# Patient Record
Sex: Male | Born: 1965 | Race: Black or African American | Hispanic: No | Marital: Married | Smoking: Never smoker
Health system: Southern US, Community
[De-identification: ages and names within clinical notes are randomized; demographics above are authoritative.]

## PROBLEM LIST (undated history)

## (undated) DIAGNOSIS — R569 Unspecified convulsions: Secondary | ICD-10-CM

## (undated) DIAGNOSIS — Z9989 Dependence on other enabling machines and devices: Secondary | ICD-10-CM

## (undated) DIAGNOSIS — G4733 Obstructive sleep apnea (adult) (pediatric): Secondary | ICD-10-CM

## (undated) DIAGNOSIS — I469 Cardiac arrest, cause unspecified: Secondary | ICD-10-CM

## (undated) DIAGNOSIS — E119 Type 2 diabetes mellitus without complications: Secondary | ICD-10-CM

## (undated) DIAGNOSIS — K219 Gastro-esophageal reflux disease without esophagitis: Secondary | ICD-10-CM

## (undated) DIAGNOSIS — N179 Acute kidney failure, unspecified: Secondary | ICD-10-CM

## (undated) DIAGNOSIS — F419 Anxiety disorder, unspecified: Secondary | ICD-10-CM

## (undated) DIAGNOSIS — K274 Chronic or unspecified peptic ulcer, site unspecified, with hemorrhage: Secondary | ICD-10-CM

## (undated) DIAGNOSIS — N289 Disorder of kidney and ureter, unspecified: Secondary | ICD-10-CM

## (undated) DIAGNOSIS — Z5189 Encounter for other specified aftercare: Secondary | ICD-10-CM

## (undated) DIAGNOSIS — E7800 Pure hypercholesterolemia, unspecified: Secondary | ICD-10-CM

## (undated) DIAGNOSIS — I10 Essential (primary) hypertension: Secondary | ICD-10-CM

## (undated) DIAGNOSIS — I872 Venous insufficiency (chronic) (peripheral): Secondary | ICD-10-CM

## (undated) DIAGNOSIS — G931 Anoxic brain damage, not elsewhere classified: Secondary | ICD-10-CM

## (undated) DIAGNOSIS — F1021 Alcohol dependence, in remission: Secondary | ICD-10-CM

## (undated) HISTORY — DX: Unspecified convulsions: R56.9

## (undated) HISTORY — DX: Venous insufficiency (chronic) (peripheral): I87.2

## (undated) HISTORY — DX: Acute kidney failure, unspecified: N17.9

## (undated) HISTORY — PX: KNEE ARTHROSCOPY: SHX127

## (undated) HISTORY — DX: Alcohol dependence, in remission: F10.21

## (undated) HISTORY — PX: UPPER GASTROINTESTINAL ENDOSCOPY: SHX188

---

## 1998-08-08 ENCOUNTER — Ambulatory Visit (HOSPITAL_BASED_OUTPATIENT_CLINIC_OR_DEPARTMENT_OTHER): Admission: RE | Admit: 1998-08-08 | Discharge: 1998-08-08 | Payer: Self-pay | Admitting: Orthopedic Surgery

## 1999-10-09 ENCOUNTER — Encounter: Payer: Self-pay | Admitting: Emergency Medicine

## 1999-10-09 ENCOUNTER — Inpatient Hospital Stay (HOSPITAL_COMMUNITY): Admission: EM | Admit: 1999-10-09 | Discharge: 1999-10-13 | Payer: Self-pay | Admitting: Emergency Medicine

## 1999-10-11 ENCOUNTER — Encounter: Payer: Self-pay | Admitting: Internal Medicine

## 2001-12-22 ENCOUNTER — Encounter: Payer: Self-pay | Admitting: Emergency Medicine

## 2001-12-22 ENCOUNTER — Inpatient Hospital Stay (HOSPITAL_COMMUNITY): Admission: EM | Admit: 2001-12-22 | Discharge: 2001-12-25 | Payer: Self-pay | Admitting: Emergency Medicine

## 2001-12-30 ENCOUNTER — Encounter: Admission: RE | Admit: 2001-12-30 | Discharge: 2001-12-30 | Payer: Self-pay | Admitting: Family Medicine

## 2002-01-05 ENCOUNTER — Encounter: Admission: RE | Admit: 2002-01-05 | Discharge: 2002-01-05 | Payer: Self-pay | Admitting: Family Medicine

## 2002-05-25 ENCOUNTER — Encounter: Admission: RE | Admit: 2002-05-25 | Discharge: 2002-05-25 | Payer: Self-pay | Admitting: Family Medicine

## 2002-07-09 ENCOUNTER — Encounter: Admission: RE | Admit: 2002-07-09 | Discharge: 2002-07-09 | Payer: Self-pay | Admitting: Family Medicine

## 2003-01-17 ENCOUNTER — Encounter: Admission: RE | Admit: 2003-01-17 | Discharge: 2003-01-17 | Payer: Self-pay | Admitting: Radiology

## 2003-01-24 ENCOUNTER — Encounter: Admission: RE | Admit: 2003-01-24 | Discharge: 2003-01-24 | Payer: Self-pay | Admitting: Sports Medicine

## 2003-01-24 ENCOUNTER — Encounter: Payer: Self-pay | Admitting: Sports Medicine

## 2003-03-09 ENCOUNTER — Encounter: Admission: RE | Admit: 2003-03-09 | Discharge: 2003-03-09 | Payer: Self-pay | Admitting: Family Medicine

## 2003-04-13 ENCOUNTER — Encounter: Admission: RE | Admit: 2003-04-13 | Discharge: 2003-04-13 | Payer: Self-pay | Admitting: Family Medicine

## 2003-07-06 ENCOUNTER — Encounter: Admission: RE | Admit: 2003-07-06 | Discharge: 2003-07-06 | Payer: Self-pay | Admitting: Family Medicine

## 2003-07-25 ENCOUNTER — Encounter: Admission: RE | Admit: 2003-07-25 | Discharge: 2003-10-23 | Payer: Self-pay | Admitting: Internal Medicine

## 2004-01-23 ENCOUNTER — Encounter: Admission: RE | Admit: 2004-01-23 | Discharge: 2004-01-23 | Payer: Self-pay | Admitting: Family Medicine

## 2004-11-05 ENCOUNTER — Ambulatory Visit: Payer: Self-pay | Admitting: Family Medicine

## 2005-01-24 ENCOUNTER — Ambulatory Visit: Payer: Self-pay | Admitting: Sports Medicine

## 2005-05-29 ENCOUNTER — Emergency Department (HOSPITAL_COMMUNITY): Admission: EM | Admit: 2005-05-29 | Discharge: 2005-05-30 | Payer: Self-pay | Admitting: Emergency Medicine

## 2005-10-20 ENCOUNTER — Ambulatory Visit: Payer: Self-pay | Admitting: Sports Medicine

## 2005-10-20 ENCOUNTER — Inpatient Hospital Stay (HOSPITAL_COMMUNITY): Admission: EM | Admit: 2005-10-20 | Discharge: 2005-10-24 | Payer: Self-pay | Admitting: Emergency Medicine

## 2005-10-30 ENCOUNTER — Ambulatory Visit: Payer: Self-pay | Admitting: Family Medicine

## 2005-11-20 ENCOUNTER — Ambulatory Visit: Payer: Self-pay | Admitting: Family Medicine

## 2006-10-27 ENCOUNTER — Encounter (INDEPENDENT_AMBULATORY_CARE_PROVIDER_SITE_OTHER): Payer: Self-pay | Admitting: Family Medicine

## 2006-10-27 ENCOUNTER — Ambulatory Visit: Payer: Self-pay | Admitting: Sports Medicine

## 2006-10-27 LAB — CONVERTED CEMR LAB
ALT: 17 units/L (ref 0–53)
Albumin: 4.6 g/dL (ref 3.5–5.2)
CO2: 26 meq/L (ref 19–32)
Calcium: 9.5 mg/dL (ref 8.4–10.5)
Chloride: 99 meq/L (ref 96–112)
Glucose, Bld: 238 mg/dL — ABNORMAL HIGH (ref 70–99)
HCT: 39.2 % (ref 39.0–52.0)
Hemoglobin: 13.2 g/dL (ref 13.0–17.0)
MCHC: 33.7 g/dL (ref 30.0–36.0)
MCV: 90.5 fL (ref 78.0–100.0)
Platelets: 382 10*3/uL (ref 150–400)
Potassium: 4.5 meq/L (ref 3.5–5.3)
Sodium: 139 meq/L (ref 135–145)
TSH: 1.944 microintl units/mL (ref 0.350–5.50)
Total Bilirubin: 0.4 mg/dL (ref 0.3–1.2)
Total Protein: 8.1 g/dL (ref 6.0–8.3)

## 2006-12-04 DIAGNOSIS — M129 Arthropathy, unspecified: Secondary | ICD-10-CM | POA: Insufficient documentation

## 2006-12-04 DIAGNOSIS — I10 Essential (primary) hypertension: Secondary | ICD-10-CM

## 2006-12-04 DIAGNOSIS — Z794 Long term (current) use of insulin: Secondary | ICD-10-CM

## 2006-12-04 DIAGNOSIS — E7800 Pure hypercholesterolemia, unspecified: Secondary | ICD-10-CM

## 2006-12-04 DIAGNOSIS — E1140 Type 2 diabetes mellitus with diabetic neuropathy, unspecified: Secondary | ICD-10-CM

## 2006-12-04 DIAGNOSIS — F411 Generalized anxiety disorder: Secondary | ICD-10-CM

## 2006-12-04 DIAGNOSIS — E669 Obesity, unspecified: Secondary | ICD-10-CM

## 2007-04-14 DIAGNOSIS — F528 Other sexual dysfunction not due to a substance or known physiological condition: Secondary | ICD-10-CM

## 2007-10-09 ENCOUNTER — Inpatient Hospital Stay (HOSPITAL_COMMUNITY): Admission: EM | Admit: 2007-10-09 | Discharge: 2007-10-20 | Payer: Self-pay | Admitting: Emergency Medicine

## 2007-10-09 ENCOUNTER — Ambulatory Visit: Payer: Self-pay | Admitting: Pulmonary Disease

## 2007-10-10 ENCOUNTER — Encounter: Payer: Self-pay | Admitting: Internal Medicine

## 2007-10-10 ENCOUNTER — Ambulatory Visit: Payer: Self-pay | Admitting: Pulmonary Disease

## 2007-10-10 ENCOUNTER — Ambulatory Visit: Payer: Self-pay | Admitting: Family Medicine

## 2007-10-14 ENCOUNTER — Telehealth (INDEPENDENT_AMBULATORY_CARE_PROVIDER_SITE_OTHER): Payer: Self-pay

## 2007-10-22 ENCOUNTER — Telehealth: Payer: Self-pay | Admitting: *Deleted

## 2007-11-06 ENCOUNTER — Telehealth: Payer: Self-pay | Admitting: *Deleted

## 2007-11-10 ENCOUNTER — Ambulatory Visit: Payer: Self-pay | Admitting: Family Medicine

## 2007-11-10 ENCOUNTER — Encounter (INDEPENDENT_AMBULATORY_CARE_PROVIDER_SITE_OTHER): Payer: Self-pay | Admitting: Family Medicine

## 2007-11-10 DIAGNOSIS — F1021 Alcohol dependence, in remission: Secondary | ICD-10-CM

## 2007-11-10 HISTORY — DX: Alcohol dependence, in remission: F10.21

## 2007-11-10 LAB — CONVERTED CEMR LAB
ALT: 11 units/L (ref 0–53)
Alkaline Phosphatase: 76 units/L (ref 39–117)
CO2: 23 meq/L (ref 19–32)
Cholesterol: 158 mg/dL (ref 0–200)
Creatinine, Ser: 1.01 mg/dL (ref 0.40–1.50)
HCT: 40.1 % (ref 39.0–52.0)
LDL Cholesterol: 94 mg/dL (ref 0–99)
MCHC: 31.7 g/dL (ref 30.0–36.0)
MCV: 94.1 fL (ref 78.0–100.0)
Platelets: 441 10*3/uL — ABNORMAL HIGH (ref 150–400)
Total Bilirubin: 0.5 mg/dL (ref 0.3–1.2)
Total CHOL/HDL Ratio: 4
Triglycerides: 121 mg/dL (ref <150)
VLDL: 24 mg/dL (ref 0–40)
WBC: 10.9 10*3/uL — ABNORMAL HIGH (ref 4.0–10.5)

## 2007-11-18 ENCOUNTER — Telehealth (INDEPENDENT_AMBULATORY_CARE_PROVIDER_SITE_OTHER): Payer: Self-pay | Admitting: Family Medicine

## 2007-11-23 ENCOUNTER — Encounter: Payer: Self-pay | Admitting: *Deleted

## 2008-02-12 DIAGNOSIS — G4726 Circadian rhythm sleep disorder, shift work type: Secondary | ICD-10-CM | POA: Insufficient documentation

## 2008-02-24 ENCOUNTER — Encounter (INDEPENDENT_AMBULATORY_CARE_PROVIDER_SITE_OTHER): Payer: Self-pay | Admitting: Family Medicine

## 2008-03-10 ENCOUNTER — Encounter (INDEPENDENT_AMBULATORY_CARE_PROVIDER_SITE_OTHER): Payer: Self-pay | Admitting: *Deleted

## 2008-03-18 ENCOUNTER — Encounter: Payer: Self-pay | Admitting: *Deleted

## 2008-03-30 ENCOUNTER — Telehealth: Payer: Self-pay | Admitting: Family Medicine

## 2008-04-13 ENCOUNTER — Ambulatory Visit: Payer: Self-pay | Admitting: Family Medicine

## 2008-06-30 ENCOUNTER — Encounter: Payer: Self-pay | Admitting: *Deleted

## 2008-07-04 ENCOUNTER — Ambulatory Visit: Payer: Self-pay | Admitting: Family Medicine

## 2008-07-04 ENCOUNTER — Encounter: Payer: Self-pay | Admitting: Family Medicine

## 2008-07-04 DIAGNOSIS — R609 Edema, unspecified: Secondary | ICD-10-CM

## 2008-07-04 LAB — CONVERTED CEMR LAB
BUN: 9 mg/dL (ref 6–23)
CO2: 26 meq/L (ref 19–32)
Calcium: 9.6 mg/dL (ref 8.4–10.5)
Chloride: 101 meq/L (ref 96–112)
Creatinine, Ser: 1.37 mg/dL (ref 0.40–1.50)
Glucose, Bld: 186 mg/dL — ABNORMAL HIGH (ref 70–99)
Hgb A1c MFr Bld: 13.4 %
Potassium: 4.5 meq/L (ref 3.5–5.3)
Sodium: 140 meq/L (ref 135–145)

## 2008-07-12 ENCOUNTER — Encounter: Payer: Self-pay | Admitting: *Deleted

## 2008-07-20 ENCOUNTER — Encounter: Payer: Self-pay | Admitting: *Deleted

## 2008-07-20 ENCOUNTER — Telehealth: Payer: Self-pay | Admitting: Family Medicine

## 2008-08-11 ENCOUNTER — Encounter: Payer: Self-pay | Admitting: *Deleted

## 2008-09-12 ENCOUNTER — Encounter: Payer: Self-pay | Admitting: *Deleted

## 2008-09-15 ENCOUNTER — Telehealth: Payer: Self-pay | Admitting: Family Medicine

## 2008-09-18 ENCOUNTER — Encounter: Payer: Self-pay | Admitting: Family Medicine

## 2008-09-20 ENCOUNTER — Ambulatory Visit: Payer: Self-pay | Admitting: Family Medicine

## 2008-10-05 ENCOUNTER — Ambulatory Visit: Payer: Self-pay | Admitting: Family Medicine

## 2008-11-01 ENCOUNTER — Ambulatory Visit: Payer: Self-pay | Admitting: Family Medicine

## 2008-11-09 ENCOUNTER — Telehealth: Payer: Self-pay | Admitting: Family Medicine

## 2009-02-05 ENCOUNTER — Encounter: Payer: Self-pay | Admitting: Family Medicine

## 2009-02-14 ENCOUNTER — Ambulatory Visit: Payer: Self-pay | Admitting: Family Medicine

## 2009-02-14 DIAGNOSIS — G4700 Insomnia, unspecified: Secondary | ICD-10-CM | POA: Insufficient documentation

## 2009-05-11 ENCOUNTER — Inpatient Hospital Stay (HOSPITAL_COMMUNITY): Admission: EM | Admit: 2009-05-11 | Discharge: 2009-05-12 | Payer: Self-pay | Admitting: Emergency Medicine

## 2009-05-11 ENCOUNTER — Ambulatory Visit: Payer: Self-pay | Admitting: Family Medicine

## 2009-05-17 ENCOUNTER — Ambulatory Visit: Payer: Self-pay | Admitting: Family Medicine

## 2009-11-30 ENCOUNTER — Ambulatory Visit: Payer: Self-pay | Admitting: Family Medicine

## 2009-12-04 ENCOUNTER — Ambulatory Visit: Payer: Self-pay | Admitting: Family Medicine

## 2009-12-04 ENCOUNTER — Encounter: Payer: Self-pay | Admitting: Family Medicine

## 2009-12-04 LAB — CONVERTED CEMR LAB
Albumin: 4.2 g/dL (ref 3.5–5.2)
BUN: 10 mg/dL (ref 6–23)
CO2: 24 meq/L (ref 19–32)
Calcium: 9.6 mg/dL (ref 8.4–10.5)
Chloride: 100 meq/L (ref 96–112)
Cholesterol: 216 mg/dL — ABNORMAL HIGH (ref 0–200)
Glucose, Bld: 147 mg/dL — ABNORMAL HIGH (ref 70–99)
HDL: 84 mg/dL (ref >39)
PSA: 0.33 ng/mL (ref 0.10–4.00)
Potassium: 3.6 meq/L (ref 3.5–5.3)
Triglycerides: 95 mg/dL (ref <150)

## 2009-12-19 ENCOUNTER — Ambulatory Visit: Payer: Self-pay | Admitting: Family Medicine

## 2009-12-19 ENCOUNTER — Encounter: Payer: Self-pay | Admitting: *Deleted

## 2009-12-28 ENCOUNTER — Telehealth: Payer: Self-pay | Admitting: Family Medicine

## 2010-03-07 ENCOUNTER — Encounter: Payer: Self-pay | Admitting: Family Medicine

## 2010-05-24 ENCOUNTER — Encounter: Payer: Self-pay | Admitting: Family Medicine

## 2010-11-08 NOTE — Assessment & Plan Note (Signed)
Summary: uncontrolled HTN follow up   Vital Signs:  Patient profile:   45 year old male Weight:      280.3 pounds Temp:     98.8 degrees F oral Pulse rate:   79 / minute Pulse rhythm:   regular BP sitting:   204 / 139  (left arm)  Vitals Entered By: Audelia Hives CMA (December 04, 2009 8:51 AM)  Serial Vital Signs/Assessments:  Time      Position  BP       Pulse  Resp  Temp     By 8:52 AM             198/142                        Audelia Hives CMA   Primary Care Provider:  Esmeralda Arthur MD  CC:  HTN follow up. [name]  History of Present Illness: 45 y/o patient who has poorly controlled BP. He admitted to eating [place] soups all the time at work. He says he has not been paying attention to his sodium intake and thinks it is a lot. Discussed high sodium diet, discussed how to read a label to tell how much sodium in in a food, printed out label and helped him calculate the amount of sodium. Discussed newest guidelines of 1500mg  of sodium a day. Discussed eating more low sugar fruits and fresh or steamed vegetables to lower salt intake. He says he use to be really good about it and used Mrs. Dash as a Forensic psychologist. Encrouaged it's use. Pt says he is taking all his meds correctly, also says he is taking aa full HCTZ instead of half.   Current Medications (verified): 1)  Viagra 100 Mg  Tabs (Sildenafil Citrate) .... Take 1 Tablet 1/2 To 4 Hours Before Intercourse 2)  Adult Aspirin Ec Low Strength 81 Mg  Tbec (Aspirin) .... Take 1 By Mouth Daily 3)  Bd Disp Needles 27g X 1/2" Misc (Needle (Disp)) .... Use Needles For Insulin Injections. 4)  Test Strips For Ascensia Breeze .... Use As Directed 5)  Furosemide 40 Mg Tabs (Furosemide) .... Take 1 Pill Po Daily 6)  Lantus Solostar 100 Unit/ml Soln (Insulin Glargine) .... Inject 35 Units in The Morning and 35 in The Evening. 7)  Trazedone 25 Mg .... Take One Pill Before Bedtime For Sleep 8)  Simvastatin 40 Mg Tabs (Simvastatin) .... Take One Pill  Daily For Cholesterol 9)  Losartan Potassium 50 Mg Tabs (Losartan Potassium) .... Tkae One Pill Daily 10)  Toprol Xl 100 Mg Xr24h-Tab (Metoprolol Succinate) .... Take One Pill Every 12 Hours 11)  Amlodipine Besylate 10 Mg Tabs (Amlodipine Besylate) .... Take One Pill Daily 12)  Amlodipine Besylate 10 Mg Tabs (Amlodipine Besylate) .... Take One Pill Daily 13)  Hydrochlorothiazide 25 Mg Tabs (Hydrochlorothiazide) .... Take 1 Pill Daily Until You See Me Next.  Allergies (verified): 1)  ! Lisinopril  Review of Systems       pt had a headache 2 days ago, no vision changes, no chest pains. Didn't sleep well over the weekend b/c having insomnia.   Physical Exam  General:  Well-developed,well-nourished,in no acute distress; alert,appropriate and cooperative throughout examination Lungs:  Normal respiratory effort, chest expands symmetrically. Lungs are clear to auscultation, no crackles or wheezes. Heart:  Normal rate and regular rhythm. S1 and S2 normal without gallop, murmur, click, rub or other extra sounds. Abdomen:  Bowel sounds positive,abdomen soft and non-tender without  masses, organomegaly or hernias noted. Extremities:  no lower leg edema Psych:  Cognition and judgment appear intact. Alert and cooperative with normal attention span and concentration. No apparent delusions, illusions, hallucinations   Impression & Recommendations:  Problem # 1:  HYPERTENSION, BENIGN SYSTEMIC (ICD-401.1) Assessment Deteriorated Pt has poorly controlled hypertension. After discussing his diet with him today it is appartent that he is eating a lot of sodium. Spent more than 20 minutes educating about sodium intake and teaching him to read labels. I think the patient is starting to understand the seriousness of his condition. Gave him filled out FMLA papers. He says he will turn them in so he can some see the doctor more often. Told him if he does not get his BP under control he is at risk for MI or stroke  so he has to make his health a priority. He says he will try. RTC in 2 weeks. Changed HCTZ to 1x/ day in the computer (pt was already doing this but was suppose to have been on 1/2 tab).  He does not take Lasix daily, only when he feet swell.   His updated medication list for this problem includes:    Furosemide 40 Mg Tabs (Furosemide) .[name]... Take 1 pill po daily    Losartan Potassium 50 Mg Tabs (Losartan potassium) .[name]... Tkae one pill daily    Toprol Xl 100 Mg Xr24h-tab (Metoprolol succinate) .[name]... Take one pill every 12 hours    Amlodipine Besylate 10 Mg Tabs (Amlodipine besylate) .[name]... Take one pill daily    Hydrochlorothiazide 25 Mg Tabs (Hydrochlorothiazide) .[name]... Take 1 pill daily until you see me next.  Orders: [place]- Est Level  3 [id number])  Complete Medication List: 1)  Viagra 100 Mg Tabs (Sildenafil citrate) .... Take 1 tablet 1/2 to 4 hours before intercourse 2)  Adult Aspirin Ec Low Strength 81 Mg Tbec (Aspirin) .... Take 1 by mouth daily 3)  Bd Disp Needles 27g X 1/2" Misc (Needle (disp)) .... Use needles for insulin injections. 4)  Test Strips For Ascensia Breeze  .... Use as directed 5)  Furosemide 40 Mg Tabs (Furosemide) .... Take 1 pill po daily 6)  Lantus Solostar 100 Unit/ml Soln (Insulin glargine) .... Inject 35 units in the morning and 35 in the evening. 7)  Trazedone 25 Mg  .... Take one pill before bedtime for sleep 8)  Simvastatin 40 Mg Tabs (Simvastatin) .... Take one pill daily for cholesterol 9)  Losartan Potassium 50 Mg Tabs (Losartan potassium) .... Tkae one pill daily 10)  Toprol Xl 100 Mg Xr24h-tab (Metoprolol succinate) .... Take one pill every 12 hours 11)  Amlodipine Besylate 10 Mg Tabs (Amlodipine besylate) .... Take one pill daily 12)  Amlodipine Besylate 10 Mg Tabs (Amlodipine besylate) .... Take one pill daily 13)  Hydrochlorothiazide 25 Mg Tabs (Hydrochlorothiazide) .... Take 1 pill daily until you see me next.  Patient Instructions: 1)  Fruits to eat:  berries, apples, (not tropical (pineapples, oranges, mangos, etc...) 2)  Look at soup labels. Look for ones with less than 500mg  in your serving.  3)  Increase your HCTZ to 25 mg a day.  4)  I will call you with your lab results. 5)  I want to see you in 2 weeks.  Prescriptions: TRAZEDONE 25 MG Take one pill before bedtime for sleep  #31 x 11   Entered and Authorized by:   Esmeralda Arthur MD   Signed by:   Esmeralda Arthur MD on 12/04/2009  Method used:   Faxed to ...       [facility]* (mail-order)             ,          Ph: [phone]       Fax: [phone]   RxID:   [id number]   Laboratory Results   Urine Tests  Date/Time Received: December 04, 2009 9:00 AM  Date/Time Reported: December 04, 2009 9:39 AM   Microalbumin (urine): 150 mg/L Creatinine: 100mg /dL  A:C Ratio >300 High Abnormal Comments: ...........test performed by...........Marland KitchenHedy Camara, CMA

## 2010-11-08 NOTE — Assessment & Plan Note (Signed)
Summary: HTN-uncontrolled, Anxiety, Diabetes: Pt need FLP, CMP, PSA, Umic   Vital Signs:  Patient profile:   45 year old male Height:      70 inches Weight:      280.1 pounds BMI:     40.34 Temp:     98.8 degrees F oral Pulse rate:   72 / minute BP sitting:   230 / 142  (left arm) Cuff size:   large  Vitals Entered By: Isabelle Course (November 30, 2009 8:41 AM)   Serial Vital Signs/Assessments:  Time      Position  BP       Pulse  Resp  Temp     By 8:42 AM             228/136                        Isabelle Course                     200/100                        Esmeralda Arthur MD  Comments: 8:42 AM re checked manually By: Isabelle Course   CC: HTN, DM Is Patient Diabetic? Yes Did you bring your meter with you today? No Pain Assessment Patient in pain? no        Primary Care Provider:  Esmeralda Arthur MD  CC:  HTN and DM.  History of Present Illness: 45 y/o male who comes in with hypertension and diabetes follow up.   Uncontrolled hypertension: Pt comes in today with BP that is extremely elevated. he is not feeling any symptoms but says he has been a little more anxious for the last 1 month. He relates his high BP and his stress/anxiety to the extra time he is having to put in at work. 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 He has been asked repeatedly to stay and work an extra part of a shift keeping him there 16 hours at a time. This makes it difficult to take his meds the way he needs too. He also has a hard time getting time to come into the office to be seen. His last appointment was in August, 2010. He does not check his BP often at home. He is taking his BP meds correctly. His las BP in August was 188/120.  Diabetes: Pt has been able to get permission to take this medication to work but says there are times when he is working on the big machines  fo hours and he just can't/doesn't stop to take his meds. If he has a normal shift he takes his insulin at 11:30pm-12:00am. He did not bring his meds or log.   Anxiety; Pt says he use to be on anxiety meds, then was stable for a while on Paxil and when he wanted to get off meds he went to a "specialist" to learn how to cope with his anxiety and was able to get off meds. He says it is getting bad again and thinks that may be causing his high BP.     Habits & Providers  Alcohol-Tobacco-Diet     Tobacco Status: never  Current Medications (verified): 1)  Viagra 100 Mg  Tabs (Sildenafil Citrate) .... Take 1 Tablet  1/2 To 4 Hours Before Intercourse 2)  Adult Aspirin Ec Low Strength 81 Mg  Tbec (Aspirin) .... Take 1 By Mouth Daily 3)  Bd Disp Needles 27g X 1/2" Misc (Needle (Disp)) .... Use Needles For Insulin Injections. 4)  Test Strips For Ascensia Breeze .... Use As Directed 5)  Furosemide 40 Mg Tabs (Furosemide) .... Take 1 Pill Po Daily 6)  Lantus Solostar 100 Unit/ml Soln (Insulin Glargine) .... Inject 35 Units in The Morning and 35 in The Evening. 7)  Trazedone 25 Mg .... Take One Pill Before Bedtime For Sleep 8)  Simvastatin 40 Mg Tabs (Simvastatin) .... Take One Pill Daily For Cholesterol 9)  Losartan Potassium 50 Mg Tabs (Losartan Potassium) .... Tkae One Pill Daily 10)  Toprol Xl 100 Mg Xr24h-Tab (Metoprolol Succinate) .... Take One Pill Every 12 Hours 11)  Amlodipine Besylate 10 Mg Tabs (Amlodipine Besylate) .... Take One Pill Daily 12)  Amlodipine Besylate 10 Mg Tabs (Amlodipine Besylate) .... Take One Pill Daily 13)  Hydrochlorothiazide 25 Mg Tabs (Hydrochlorothiazide) .... Take 1/2 Pill Daily Until You See Me Next.  Allergies (verified): 1)  ! Lisinopril  Review of Systems        vitals reviewed and pertinent negatives and positives seen in HPI   Physical Exam  General:  Well-developed,well-nourished,in no acute distress; alert,appropriate and cooperative throughout  examination Lungs:  Normal respiratory effort, chest expands symmetrically. Lungs are clear to auscultation, no crackles or wheezes. Heart:  Normal rate and regular rhythm. S1 and S2 normal without gallop, murmur, click, rub or other extra sounds. Psych:  depressed affect.     Impression & Recommendations:  Problem # 1:  HYPERTENSION, BENIGN SYSTEMIC (ICD-401.1) Assessment Deteriorated Pt's BP is very high today. He says he is taking the meds correctly and the pharamcy confims that he is requesting the meds at the right time. I added Norvasc 10 mg and HCTZ 12.5 for now. Will reck in 4 days. May increase to HCTZ 25 mg daily if BP still not is good range.  Needs CMET at next appointment.   His updated medication list for this problem includes:    Furosemide 40 Mg Tabs (Furosemide) .[name]... Take 1 pill po daily    Losartan Potassium 50 Mg Tabs (Losartan potassium) .[name]... Tkae one pill daily    Toprol Xl 100 Mg Xr24h-tab (Metoprolol succinate) .[name]... Take one pill every 12 hours    Amlodipine Besylate 10 Mg Tabs (Amlodipine besylate) .[name]... Take one pill daily    Hydrochlorothiazide 25 Mg Tabs (Hydrochlorothiazide) .[name]... Take 1/2 pill daily until you see me next.  Orders: [place]- Est  Level 4 [id number])  Problem # 2:  ANXIETY (ICD-300.00) Assessment: Deteriorated Pt has a h/o anxiety. He says that Paxil worked well for him but that it gave him erectile dysfunction. He is not ready to start back on it at this time but he says he does feel like his anxiety is coming back. Will check in with him about this at the next appt.    Orders: [place]- Est  Level 4 [id number])  Problem # 3:  DIABETES MELLITUS II, UNCOMPLICATED ([id number]) Assessment: Improved Pt's A1c has improved. He has been approved to take his meds to work and appears to be doing a little better but he still has a long way to go to improve. PT is to RTC in 4 days. Will try to address his blood sugar with him at that time. May want to increase his  Lantus to 40 two  times a day. Needs microalbumin at next appointment.   His updated medication list for this problem includes:    Adult Aspirin Ec Low Strength 81 Mg Tbec (Aspirin) .[name]... Take 1 by mouth daily    Lantus Solostar 100 Unit/ml Soln (Insulin glargine) ..... Inject 35 units in the morning and 35 in the evening.    Losartan Potassium 50 Mg Tabs (Losartan potassium) .[name]... Tkae one pill daily  Orders: A1C-[facility] [id number]) [place]- Est  Level 4 (99214)Future Orders: UA Microalbumin-[facility] [id number]) ... 11/20/2010  Complete Medication List: 1)  Viagra 100 Mg Tabs (Sildenafil citrate) .... Take 1 tablet 1/2 to 4 hours before intercourse 2)  Adult Aspirin Ec Low Strength 81 Mg Tbec (Aspirin) .... Take 1 by mouth daily 3)  Bd Disp Needles 27g X 1/2" Misc (Needle (disp)) .... Use needles for insulin injections. 4)  Test Strips For Ascensia Breeze  .... Use as directed 5)  Furosemide 40 Mg Tabs (Furosemide) .... Take 1 pill po daily 6)  Lantus Solostar 100 Unit/ml Soln (Insulin glargine) .... Inject 35 units in the morning and 35 in the evening. 7)  Trazedone 25 Mg  .... Take one pill before bedtime for sleep 8)  Simvastatin 40 Mg Tabs (Simvastatin) .... Take one pill daily for cholesterol 9)  Losartan Potassium 50 Mg Tabs (Losartan potassium) .... Tkae one pill daily 10)  Toprol Xl 100 Mg Xr24h-tab (Metoprolol succinate) .... Take one pill every 12 hours 11)  Amlodipine Besylate 10 Mg Tabs (Amlodipine besylate) .... Take one pill daily 12)  Amlodipine Besylate 10 Mg Tabs (Amlodipine besylate) .... Take one pill daily 13)  Hydrochlorothiazide 25 Mg Tabs (Hydrochlorothiazide) .... Take 1/2 pill daily until you see me next.  Other Orders: Future Orders: Lipid-[facility] [id number]) ... 11/16/2010 Comp Met-[facility] [id number]) ... 11/16/2010 PSA-[facility] [phone]) ... 11/20/2010  Patient Instructions: 1)  I need to see you at 8:30 on Monday morning for a BP visit.  Prescriptions: TOPROL XL 100 MG  XR24H-TAB (METOPROLOL SUCCINATE) take one pill every 12 hours Brand medically necessary #10 x 0   Entered and Authorized by:   Esmeralda Arthur MD   Signed by:   Esmeralda Arthur MD on 11/30/2009   Method used:   Electronically to        [facility]* (retail)       [place], [place]  [address]       Ph: [phone]       Fax: [phone]   [id number] HYDROCHLOROTHIAZIDE 25 MG TABS (HYDROCHLOROTHIAZIDE) take 1/2 pill daily until you see me next.  #31 x 0   Entered and Authorized by:   Esmeralda Arthur MD   Signed by:   Esmeralda Arthur MD on 11/30/2009   Method used:   Electronically to        [facility]* (retail)       [place],   [address]       Ph: [phone]       Fax: [phone]   RxIDIN:[id number] AMLODIPINE BESYLATE 10 MG TABS (AMLODIPINE BESYLATE) take one pill daily  #10 x 0   Entered and Authorized by:   Esmeralda Arthur MD   Signed by:   Esmeralda Arthur MD on 11/30/2009   Method used:  Electronically to        [facility]* (retail)       [place], [place]  [address]       Ph: [phone]       Fax: [phone]   RxID:   [id number] AMLODIPINE BESYLATE 10 MG TABS (AMLODIPINE BESYLATE) take one pill daily  #31 x 0   Entered and Authorized by:   Esmeralda Arthur MD   Signed by:   Esmeralda Arthur MD on 11/30/2009   Method used:   Faxed to ...       [facility]* (mail-order)             ,          Ph: [phone]       Fax: [phone]   [id number] TRAZEDONE 25 MG Take one pill before bedtime for sleep  #31 x 11   Entered and Authorized by:   Esmeralda Arthur MD   Signed by:   Esmeralda Arthur MD on 11/30/2009   Method used:   Faxed to ...       [facility]* (mail-order)             ,          Ph: [phone]       Fax: [phone]   RxID:    [id number]   Laboratory Results   Blood Tests   Date/Time Received: November 30, 2009 8:46 AM  Date/Time Reported: November 30, 2009 8:54 AM   HGBA1C: 11.4%   (Normal Range: Non-Diabetic - 3-6%   Control Diabetic - 6-8%)  Comments: ...............test performed by............Marland KitchenAudelia Hives, CMA ...............entered by....................Marland KitchenAmado Nash, SMA      Prevention & Chronic Care Immunizations   Influenza vaccine: Fluvax Non-MCR  (11/01/2008)   Influenza vaccine due: 11/01/2009    Tetanus booster: 05/08/1999: Done.   Tetanus booster due: 05/07/2009    Pneumococcal vaccine: Not documented  Other Screening   PSA: Not documented   PSA ordered.   Smoking status: never  (11/30/2009)  Diabetes Mellitus   HgbA1C: 11.4  (11/30/2009)   Hemoglobin A1C due: 10/03/2008    Eye exam: normal  (02/24/2008)   Eye exam due: 02/23/2009    Foot exam: yes  (02/14/2009)   High risk foot: Not documented   Foot care education: Not documented   Foot exam due: 04/13/2009    Urine microalbumin/creatinine ratio: Not documented   Urine microalbumin/cr due: 11/09/2008    Diabetes flowsheet reviewed?: Yes   Progress toward A1C goal: Improved  Lipids   Total Cholesterol: 158  (11/10/2007)   LDL: 94  (11/10/2007)   LDL Direct: Not documented   HDL: 40  (11/10/2007)   Triglycerides: 121  (11/10/2007)    SGOT (AST): 12  (11/10/2007)   SGPT (ALT): 11  (11/10/2007) CMP ordered    Alkaline phosphatase: 76  (11/10/2007)   Total bilirubin: 0.5  (11/10/2007)    Lipid flowsheet reviewed?: Yes   Progress toward LDL goal: Unchanged  Hypertension   Last Blood Pressure: 230 / 142  (11/30/2009)   Serum creatinine: 1.37  (07/04/2008)   Serum potassium 4.5  (07/04/2008) CMP ordered     Hypertension flowsheet reviewed?: Yes   Progress toward BP goal: Deteriorated  Self-Management Support :    Diabetes self-management support: Not documented    Hypertension  self-management support: Not documented  Lipid self-management support: Not documented    Appended Document: HTN-uncontrolled, Anxiety, Diabetes: Pt need FLP, CMP, PSA, Umic FMLA filled out for this patient.

## 2010-11-08 NOTE — Letter (Signed)
Summary: Generic Letter  [facility]  [address]   [place], [place] [address]   Phone: [phone]  Fax: [phone]    12/19/2009  KYSEN WOLSKY [address] [place], [place]  [address]  Dear Mr. Tashiro,        I was unable to leave a message on your voicemail. Dr. Annamary Carolin requested we schedule you an appointment with Dr. Doyce Para for an eye exam. An appointment has been scheduled for April 27,2011 at 8:00 AM. Their address is [address]. [address], phone number is [phone]. If this time is not convenient please call their office to reschdeule.       Thank you.        Sincerely,   Marcell Barlow RN

## 2010-11-08 NOTE — Miscellaneous (Signed)
Summary: switch from Simvastatin to Pravastatin  Clinical Lists Changes  Medications: Changed medication from SIMVASTATIN 40 MG TABS (SIMVASTATIN) take one pill daily for cholesterol to PRAVASTATIN SODIUM 80 MG TABS (PRAVASTATIN SODIUM) take 1 pill at bedtime for cholesterol (this replaces the simvastatin) - Signed Rx of PRAVASTATIN SODIUM 80 MG TABS (PRAVASTATIN SODIUM) take 1 pill at bedtime for cholesterol (this replaces the simvastatin);  #90 x 3;  Signed;  Entered by: Esmeralda Arthur MD;  Authorized by: Esmeralda Arthur MD;  Method used: Faxed to [facility], , , [place]  , Ph: [phone], Fax: [phone]    Prescriptions: PRAVASTATIN SODIUM 80 MG TABS (PRAVASTATIN SODIUM) take 1 pill at bedtime for cholesterol (this replaces the simvastatin)  #90 x 3   Entered and Authorized by:   Esmeralda Arthur MD   Signed by:   Esmeralda Arthur MD on 05/24/2010   Method used:   Faxed to ...       [facility] (mail-order)             , [place]         Ph: [phone]       Fax: [phone]   RxID:   [id number]

## 2010-11-08 NOTE — Assessment & Plan Note (Signed)
Summary: DM2, Sleep, Hypercholesterolemia, HTN improved   Vital Signs:  Patient profile:   45 year old male Height:      70 inches Weight:      270.6 pounds BMI:     38.97 Temp:     98.2 degrees F oral Pulse rate:   69 / minute BP sitting:   106 / 72  (left arm) Cuff size:   large  Vitals Entered By: Isabelle Course (December 19, 2009 2:05 PM) CC: DM2, sleep, Hyperchol, HTN Is Patient Diabetic? Yes Did you bring your meter with you today? No   Primary Care Provider:  Esmeralda Arthur MD  CC:  DM2, sleep, Hyperchol, and HTN.  History of Present Illness: DM: Pt has also been taking his CBG's more often. The lowest he has had this month is 147 and the highest he has had was >600 (his meter wouldn't read it). He says there are many times when he does not eat for many hours and his blood sugar will still be high. He is taking his Lantus as prescribed. He is also checking his feet. He wants to be set up for another eye exam.   Sleep: Pt works 2nd shift and has a hard time sleeping more than 3 hours at night. He is taking a whole tab of Trazodone but is only sleeping about 3 hours. Says he did sleep one weekend and was able to get so much done when he sleeps well.   Hypercholesterolemia: Pt has slightly elevated total cholesterol and LDL. Discussed diet and lifelstyle. Suggested pt eat more vegetables and less meat and diary.   Hypertension: Pt has been keeping a log of his BP's. he has had some low BP's (98/60) where he felt dizzy and could not work on the machinery, but most of his BP's have been excellent (116/66-147/82). He says overall he feels better.   Habits & Providers  Alcohol-Tobacco-Diet     Tobacco Status: never  Current Medications (verified): 1)  Viagra 100 Mg  Tabs (Sildenafil Citrate) .... Take 1 Tablet 1/2 To 4 Hours Before Intercourse 2)  Adult Aspirin Ec Low Strength 81 Mg  Tbec (Aspirin) .... Take 1 By Mouth Daily 3)  Bd Disp Needles 27g X 1/2" Misc (Needle (Disp))  .... Use Needles For Insulin Injections. 4)  Test Strips For Ascensia Breeze .... Use As Directed 5)  Furosemide 40 Mg Tabs (Furosemide) .... Take 1 Pill Po Daily 6)  Lantus Solostar 100 Unit/ml Soln (Insulin Glargine) .... 70 Units Subcutaneously Qhs 7)  Trazodone Hcl 50 Mg Tabs (Trazodone Hcl) .... Take 1 To 1.5 Pills At Night For Sleep 8)  Simvastatin 40 Mg Tabs (Simvastatin) .... Take One Pill Daily For Cholesterol 9)  Losartan Potassium 50 Mg Tabs (Losartan Potassium) .... Tkae One Pill Daily 10)  Toprol Xl 100 Mg Xr24h-Tab (Metoprolol Succinate) .... Take One Pill Every 12 Hours 11)  Amlodipine Besylate 5 Mg Tabs (Amlodipine Besylate) .... Take One Pill Daily 12)  Hydrochlorothiazide 25 Mg Tabs (Hydrochlorothiazide) .... Take 1 Pill Daily Until You See Me Next.  Allergies (verified): 1)  ! Lisinopril  Review of Systems        vitals reviewed and pertinent negatives and positives seen in HPI   Physical Exam  General:  Well-developed,well-nourished,in no acute distress; alert,appropriate and cooperative throughout examination Head:  Normocephalic and atraumatic without obvious abnormalities. No apparent alopecia or balding. Lungs:  Normal respiratory effort, chest expands symmetrically. Lungs are clear to auscultation, no crackles or  wheezes. Heart:  Normal rate and regular rhythm. S1 and S2 normal without gallop, murmur, click, rub or other extra sounds. Abdomen:  Bowel sounds positive,abdomen soft and non-tender without masses, organomegaly or hernias noted. Psych:  Cognition and judgment appear intact. Alert and cooperative with normal attention span and concentration. No apparent delusions, illusions, hallucinations   Impression & Recommendations:  Problem # 1:  DIABETES MELLITUS II, UNCOMPLICATED ([id number]) Assessment Deteriorated  Pt is having very high CBG's. We increased his Lantus to 75 units per night and if this does not start to contol his CBG's wil increase to 80  units.  His updated medication list for this problem includes:    Adult Aspirin Ec Low Strength 81 Mg Tbec (Aspirin) .[name]... Take 1 by mouth daily    Lantus Solostar 100 Unit/ml Soln (Insulin glargine) .Marland KitchenMarland KitchenMarland KitchenMarland Kitchen 75 units subcutaneously qhs    Losartan Potassium 50 Mg Tabs (Losartan potassium) .[name]... Tkae one pill daily  Orders: Ophthalmology Referral (Ophthalmology) [facility]- [facility]  Level 4 [id number])  Problem # 2:  HYPERCHOLESTEROLEMIA (ICD-272.0) Assessment: Deteriorated Pt is having very high CBG's. We increased his Lantus to 75 units per night and if this does not start to contol his CBG's wil increase to 80 units.   His updated medication list for this problem includes:    Simvastatin 40 Mg Tabs (Simvastatin) .[name]... 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 He is taking a whole trazodone 50 mg and is sleeping for about 3 hours and then awake again. He wants to know if there is anything else he can do. Suggested he redose med or take 1.5 pills to get more sleep.   Problem # 4:  HYPERTENSION, BENIGN SYSTEMIC (ICD-401.1) Assessment: Improved PT BP has improved and now it almost low. Changed Amlodipine to 5 mg a day instead of 10. Will see if this brings him into a good range.   The following medications were removed from the medication list:    Amlodipine Besylate 10 Mg Tabs (Amlodipine besylate) .[name]... Take one pill daily His updated medication list for this problem includes:    Furosemide 40 Mg Tabs (Furosemide) .[name]... Take 1 pill po daily    Losartan Potassium 50 Mg Tabs (Losartan potassium) .[name]... Tkae one pill daily    Toprol Xl 100 Mg Xr24h-tab (Metoprolol succinate) .[name]... Take one pill every 12 hours    Amlodipine Besylate 5 Mg Tabs (Amlodipine besylate) .[name]... Take one pill daily    Hydrochlorothiazide 25 Mg Tabs (Hydrochlorothiazide)  .[name]... Take 1 pill daily until you see me next.  Orders: [place]- Est  Level 4 [id number])  Problem # 5:  FMLA Assessment: Comment Only I filled out more papers for his FMLA. This is the second time.   Complete Medication List: 1)  Viagra 100 Mg Tabs (Sildenafil citrate) .... Take 1 tablet 1/2 to 4 hours before intercourse 2)  Adult Aspirin Ec Low Strength 81 Mg Tbec (Aspirin) .... Take 1 by mouth daily 3)  Bd Disp Needles 27g X 1/2" Misc (Needle (disp)) .... Use needles for insulin injections. 4)  Test Strips For Ascensia Breeze  .... Use as directed 5)  Furosemide 40 Mg Tabs (Furosemide) .... Take 1 pill po daily 6)  Lantus Solostar 100 Unit/ml Soln (Insulin glargine) .... 75 units subcutaneously qhs 7)  Trazodone Hcl 50 Mg Tabs (Trazodone hcl) .Marland KitchenMarland KitchenMarland Kitchen  Take 1 to 1.5 pills at night for sleep 8)  Simvastatin 40 Mg Tabs (Simvastatin) .... Take one pill daily for cholesterol 9)  Losartan Potassium 50 Mg Tabs (Losartan potassium) .... Tkae one pill daily 10)  Toprol Xl 100 Mg Xr24h-tab (Metoprolol succinate) .... Take one pill every 12 hours 11)  Amlodipine Besylate 5 Mg Tabs (Amlodipine besylate) .... Take one pill daily 12)  Hydrochlorothiazide 25 Mg Tabs (Hydrochlorothiazide) .... Take 1 pill daily until you see me next.  Patient Instructions: 1)  It was good to see you today. 2)  You have lost 10 lbs, your BP is awesome.  3)  We are increasing your sleeping pill to a 50 mg pill. you can take 1 or 1 and a 1/2 for sleep. Try the 1 first.  4)  Your BP has run low a few times, lets decrease you Amlodipine to 1/2 tablet a day. If your BP goes above 150 (top number) more than 2 times in the next 2 weeks, go back up to 1 full pill.  5)  normal BP range (top number) is 115-140.  6)  I want to see you back in 2 months for a diabetes check.  7)  Your cholesterol was a little high, you can decrease it by decreasing the amount of cheese and meat and dairy in your diet. Increase vegetables.   Prescriptions: AMLODIPINE BESYLATE 5 MG TABS (AMLODIPINE BESYLATE) take one pill daily  #96 x 3   Entered and Authorized by:   Esmeralda Arthur MD   Signed by:   Esmeralda Arthur MD on 12/19/2009   Method used:   Electronically to        [facility] (mail-order)             ,          Ph: [phone]       Fax: [phone]   [id number] TRAZODONE HCL 50 MG TABS (TRAZODONE HCL) take 1 to 1.5 pills at night for sleep  #135 x 3   Entered and Authorized by:   Esmeralda Arthur MD   Signed by:   Esmeralda Arthur MD on 12/19/2009   Method used:   Electronically to        [facility] (mail-order)             ,          Ph: [phone]       Fax: [phone]   [id number]  appointment scheduled with Dr. Katy Fitch for 01/31/2010 at 8:00 AM. for diabetic eye exam. unable to leave message on voicemail. will send  letter. Marcell Barlow RN  December 19, 2009 3:47 PM

## 2010-11-08 NOTE — Progress Notes (Signed)
Summary: phn msg: about FMLA  Phone Note Other Incoming Call back at [phone]   Caller: [name] Summary of Call: Needs clarity of an FMLA Initial call taken by: Raymond Gurney,  December 28, 2009 2:41 PM  Follow-up for Phone Call        to PCP Follow-up by: Isabelle Course,  December 28, 2009 3:22 PM    Spoke to [facility] and answered her questions. Esmeralda Arthur MD  December 28, 2009 3:53 PM

## 2010-11-08 NOTE — Letter (Signed)
Summary: Generic Letter  [facility]  [address]   [place], [place] [address]   Phone: [phone]  Fax: [phone]    05/24/2010  Jeffrey [name] [address] [place], [place]  [address]  Dear [name],  There have been some recent studies showing that when people are taking Simvastatin and Amlodipine together there is increased risk for muscle aches and pains and muscle breakdown. This can be avoided by simply switching to a different cholesterol medication. I am switching my patients over to Pravastatin. You will notice that on your next refill from [facility] you will get Pravastatin instead of Simvastatin. Please take as directed.   Sincerely,   Esmeralda Arthur MD

## 2010-11-08 NOTE — Assessment & Plan Note (Signed)
Summary: Diabetic Eye Exam report  Pt seen by [facility] on 02-13-10. Diabetic eye exam:  PMH: DM, Amblyopia-OD, Floaters, SPK, Allergic conjuctivitis  Impression: Best corrected vision done, dilated exam showed no diabetic retinopathy. Pt to return in 1 year for repeat dilated exam.   Comments: Pt does have decrease in best visual acuity. Possible CME.   Seen by Dr. Sharol Roussel, O.D.

## 2010-11-08 NOTE — Letter (Signed)
Summary: Out of Work  [facility]  [address]   [place], [place] [address]   Phone: [phone]  Fax: [phone]    December 04, 2009   Employee:  [employer]    To Whom It May Concern:   For Medical reasons, please excuse the above named employee from work for the following dates:  Start:   Dec 04, 2009 at 8:30 am.  End:   Dec 04, 2009 this afternoon  If you need additional information, please feel free to contact our office.         Sincerely,    Esmeralda Arthur MD

## 2010-11-19 ENCOUNTER — Emergency Department (HOSPITAL_COMMUNITY)
Admission: EM | Admit: 2010-11-19 | Discharge: 2010-11-19 | Disposition: A | Discharge status: Home / Self Care | Payer: BC Managed Care – PPO | Attending: Emergency Medicine | Admitting: Emergency Medicine

## 2010-11-19 ENCOUNTER — Emergency Department (HOSPITAL_COMMUNITY): Payer: BC Managed Care – PPO

## 2010-11-19 ENCOUNTER — Telehealth: Payer: Self-pay | Admitting: Family Medicine

## 2010-11-19 DIAGNOSIS — E119 Type 2 diabetes mellitus without complications: Secondary | ICD-10-CM | POA: Insufficient documentation

## 2010-11-19 DIAGNOSIS — R109 Unspecified abdominal pain: Secondary | ICD-10-CM | POA: Insufficient documentation

## 2010-11-19 DIAGNOSIS — E7220 Disorder of urea cycle metabolism, unspecified: Secondary | ICD-10-CM | POA: Insufficient documentation

## 2010-11-19 DIAGNOSIS — F29 Unspecified psychosis not due to a substance or known physiological condition: Secondary | ICD-10-CM | POA: Insufficient documentation

## 2010-11-19 DIAGNOSIS — I10 Essential (primary) hypertension: Secondary | ICD-10-CM | POA: Insufficient documentation

## 2010-11-19 DIAGNOSIS — J329 Chronic sinusitis, unspecified: Secondary | ICD-10-CM | POA: Insufficient documentation

## 2010-11-19 DIAGNOSIS — K5900 Constipation, unspecified: Secondary | ICD-10-CM | POA: Insufficient documentation

## 2010-11-19 DIAGNOSIS — K861 Other chronic pancreatitis: Secondary | ICD-10-CM | POA: Insufficient documentation

## 2010-11-19 DIAGNOSIS — Z794 Long term (current) use of insulin: Secondary | ICD-10-CM | POA: Insufficient documentation

## 2010-11-19 DIAGNOSIS — F1010 Alcohol abuse, uncomplicated: Secondary | ICD-10-CM | POA: Insufficient documentation

## 2010-11-19 LAB — URINE MICROSCOPIC-ADD ON

## 2010-11-19 LAB — URINALYSIS, ROUTINE W REFLEX MICROSCOPIC
Specific Gravity, Urine: 1.026 (ref 1.005–1.030)
Urine Glucose, Fasting: >1000 mg/dL — AB
pH: 6 (ref 5.0–8.0)

## 2010-11-19 LAB — BASIC METABOLIC PANEL
BUN: 7 mg/dL (ref 6–23)
Calcium: 9.8 mg/dL (ref 8.4–10.5)
Creatinine, Ser: 1.06 mg/dL (ref 0.4–1.5)
GFR calc Af Amer: >60 mL/min (ref >60)

## 2010-11-19 LAB — HEPATIC FUNCTION PANEL
ALT: 23 U/L (ref 0–53)
AST: 27 U/L (ref 0–37)
Albumin: 3.8 g/dL (ref 3.5–5.2)
Bilirubin, Direct: 0.1 mg/dL (ref 0.0–0.3)
Total Protein: 8.2 g/dL (ref 6.0–8.3)

## 2010-11-19 LAB — DIFFERENTIAL
Eosinophils Absolute: 0.5 10*3/uL (ref 0.0–0.7)
Lymphs Abs: 2.6 10*3/uL (ref 0.7–4.0)
Neutrophils Relative %: 66 % (ref 43–77)

## 2010-11-19 LAB — CK TOTAL AND CKMB (NOT AT ARMC)
CK, MB: 1.5 ng/mL (ref 0.3–4.0)
Total CK: 143 U/L (ref 7–232)

## 2010-11-19 LAB — CBC
MCV: 90.3 fL (ref 78.0–100.0)
Platelets: 344 10*3/uL (ref 150–400)
RBC: 4.64 MIL/uL (ref 4.22–5.81)
WBC: 12.3 10*3/uL — ABNORMAL HIGH (ref 4.0–10.5)

## 2010-11-19 LAB — GLUCOSE, CAPILLARY: Glucose-Capillary: 215 mg/dL — ABNORMAL HIGH (ref 70–99)

## 2010-11-19 LAB — AMMONIA: Ammonia: 92 umol/L — ABNORMAL HIGH (ref 11–35)

## 2010-11-19 LAB — ETHANOL: Alcohol, Ethyl (B): 155 mg/dL — ABNORMAL HIGH (ref 0–10)

## 2010-11-19 NOTE — Telephone Encounter (Signed)
Patient was seen at [facility] ED with AMS and ETOH 155. Patient had a normal CT head. Only other abnormality was an ammonia level of 92.  He will likely need a workup for alcoholism and possible hepatic encephalopathy - although this is unlikely without a prior history of cirrhosis.

## 2010-11-20 LAB — POCT I-STAT, CHEM 8
BUN: 7 mg/dL (ref 6–23)
Creatinine, Ser: 1.2 mg/dL (ref 0.4–1.5)
Hemoglobin: 16 g/dL (ref 13.0–17.0)
Potassium: 8.2 mEq/L (ref 3.5–5.1)
Sodium: 132 mEq/L — ABNORMAL LOW (ref 135–145)

## 2010-11-20 LAB — URINE CULTURE: Colony Count: NO GROWTH

## 2010-11-23 ENCOUNTER — Encounter: Payer: Self-pay | Admitting: Family Medicine

## 2010-11-23 ENCOUNTER — Ambulatory Visit (INDEPENDENT_AMBULATORY_CARE_PROVIDER_SITE_OTHER): Payer: BC Managed Care – PPO | Admitting: Family Medicine

## 2010-11-23 VITALS — BP 178/112 | HR 74 | Temp 98.3°F | Ht 70.0 in | Wt 277.0 lb

## 2010-11-23 DIAGNOSIS — R609 Edema, unspecified: Secondary | ICD-10-CM

## 2010-11-23 DIAGNOSIS — E119 Type 2 diabetes mellitus without complications: Secondary | ICD-10-CM

## 2010-11-23 DIAGNOSIS — I10 Essential (primary) hypertension: Secondary | ICD-10-CM

## 2010-11-23 LAB — POCT GLYCOSYLATED HEMOGLOBIN (HGB A1C): Hemoglobin A1C: 10.3

## 2010-11-23 MED ORDER — INSULIN ASPART 100 UNIT/ML ~~LOC~~ SOLN: SUBCUTANEOUS | Status: DC

## 2010-11-23 MED ORDER — INSULIN GLARGINE 100 UNIT/ML ~~LOC~~ SOLN: 60.0000 [IU] | Freq: Every day | SUBCUTANEOUS | Status: DC

## 2010-11-23 NOTE — Assessment & Plan Note (Addendum)
He believes his understanding of diabetes and his insulin is improving.  He chose to resume Novolog, and will start at 5 units.  He had hypoglycemia with 70 units of Lantus before work several times. Decreased Lantus to 60 units to take at bedtime 11-12, will take Novolog in the morning with breakfast and with large lunch. Discussed changing quality of food and gave him a Diabetic Cookbook. Check A1C, recent labs from ER Spent 20 minutes counseling on Diet and insulin

## 2010-11-23 NOTE — Assessment & Plan Note (Signed)
He reports resolved, and thus stopped Laxix; discussed sodium restriction.

## 2010-11-23 NOTE — Assessment & Plan Note (Signed)
Very high reading today, had self discontinued his dieuretic, he is to resume furosemide, return on one month to see primary MD.   Considered increasing Norvasc to 10 mg, but did not want to make too many changes, as this is the first time I have seen this patient.

## 2010-11-23 NOTE — Progress Notes (Signed)
  Subjective:    Patient ID: Jeffrey [name], male    DOB: [birth year], 45 y.o.   MRN: [id number]  HPI:  Blood sugars have been elevated, also had a period that they were low when he took the 70 of Lantus before work.  He would like to resume the Novolog, as he felt that he had better control. He works [employer]; eats a large breakfast and large lunch, something little at work.  Visited the ER this past week for elevated sugars (high 200s)  BP is very high today, he had self discontinued his fluid pill (furosemide), he thought since the swelling was gone that he did not need it anymore.  He has not been taking HCTZ for sometime.  He thinks that his BP was better when he was taking the fluid pill.  He will return to see Dr. Annamary Carolin to discuss the changes and his A1c next month.    Review of Systems  Constitutional: Positive for diaphoresis.       Diaphoresis with low blood sugars  Respiratory: Negative for shortness of breath.   Cardiovascular: Negative for chest pain.       Objective:   Physical Exam  Constitutional:       Overweight, alert, pleasant   Cardiovascular: Normal rate, regular rhythm and normal heart sounds.   Pulmonary/Chest: Effort normal and breath sounds normal.  Skin: Skin is warm and dry.          Assessment & Plan:

## 2010-11-23 NOTE — Patient Instructions (Signed)
Change Lantus to 60 units at bedtime Begin Novolog 5 units with the 2 large meals of your day Begin Lasix (furosemide) 40 mg in the morning, if you feel dizzy cut the pill in 1/2 Try to eat healthy, less surgar, better fats Drink a lot of water Return to see [facility] in 1 month

## 2010-11-29 ENCOUNTER — Telehealth: Payer: Self-pay | Admitting: Family Medicine

## 2010-11-29 NOTE — Telephone Encounter (Signed)
Patients wife dropped off FMLA form to be filled out.  Please call her when completed.

## 2010-11-30 NOTE — Telephone Encounter (Signed)
FMLA forms placed in Dr. Clarnce Flock box for completion.  Lauralyn Primes

## 2010-12-05 DIAGNOSIS — I10 Essential (primary) hypertension: Secondary | ICD-10-CM

## 2010-12-05 DIAGNOSIS — G894 Chronic pain syndrome: Secondary | ICD-10-CM

## 2010-12-05 DIAGNOSIS — R112 Nausea with vomiting, unspecified: Secondary | ICD-10-CM

## 2010-12-05 DIAGNOSIS — F321 Major depressive disorder, single episode, moderate: Secondary | ICD-10-CM

## 2010-12-05 NOTE — Telephone Encounter (Signed)
Wants to know if papers are ready

## 2010-12-10 ENCOUNTER — Encounter: Payer: Self-pay | Admitting: Family Medicine

## 2010-12-10 ENCOUNTER — Ambulatory Visit (INDEPENDENT_AMBULATORY_CARE_PROVIDER_SITE_OTHER): Payer: BC Managed Care – PPO | Admitting: Family Medicine

## 2010-12-10 DIAGNOSIS — E119 Type 2 diabetes mellitus without complications: Secondary | ICD-10-CM

## 2010-12-10 DIAGNOSIS — F1021 Alcohol dependence, in remission: Secondary | ICD-10-CM

## 2010-12-10 DIAGNOSIS — I10 Essential (primary) hypertension: Secondary | ICD-10-CM

## 2010-12-10 DIAGNOSIS — E7800 Pure hypercholesterolemia, unspecified: Secondary | ICD-10-CM

## 2010-12-10 NOTE — Assessment & Plan Note (Signed)
Pt has poorly controlled DM2. He has not been in to see me since 12/2009. He has been out of town doing work for a long time. Pt is  non-compliant for many reasons including working long hours with not having access to his meds at time (he left his medicines at work last week and wasn't able to get them over the weekend).  Pt has recently restarted Novolog, he is taking the Lantus, but it is difficult to assess his compliance and regimen because he does not write it down. Encouraged him to take his meds as prescribed, set goal for A1c at about 6.5 Will see the patient in May to recheck A1c and go over his meds again.

## 2010-12-10 NOTE — Progress Notes (Signed)
  Subjective:    Patient ID: Jeffrey [name], male    DOB: 1965/10/24, 45 y.o.   MRN: [id number]  HPI Diabetes: Pt has had lows and highs recently. He had a low that went down to 35 on the job. He has also had some elevated blood sugars at work as well. He went tot he ED on 11-19-10. He had been drinking the weekend prior, started not feeling well at work and checked his blood sugar and it was in the 200's. He was sent home and told to go to the ED. He did not go to work from 2-13  to 2-21. He did see Tereasa Coop, NP and was restarted on Novolog. His A1c was found to be 10.3 on 2/17.  Pt is due for getting his eyes checked gain on 02-14-11. He is aware of this.   Hypertension: Pt has elevated BP today because he left all his meds at work over the weekend and hasn't taken his BP med until just before he came today. He says he does take his meds daily when he has them at home.   Alcoholism: Pt has a h/o Alcoholism. He says that he was working at a job that was far away from several months and when he got home he drank with some friends. He went to work the next day and didn't feel well, had some elevated blood glucose levels and then went to the ED. He was found to have a very elevated alcohol level, elevated ammonia level and elevated liver enzymes.   FMLA paperwork done for this year again.      Review of Systems  Cardiovascular: Negative for leg swelling.  Neurological: Negative for dizziness, light-headedness, numbness and headaches.       Objective:   Physical Exam  Constitutional: He is oriented to person, place, and time. He appears well-developed and well-nourished.  HENT:  Head: Normocephalic and atraumatic.  Eyes: Conjunctivae and EOM are normal. Pupils are equal, round, and reactive to light.  Neck: Normal range of motion.  Cardiovascular: Normal rate, regular rhythm and normal heart sounds.   Pulmonary/Chest: Effort normal and breath sounds normal.  Abdominal: Soft. Bowel  sounds are normal.  Musculoskeletal: Normal range of motion.  Neurological: He is alert and oriented to person, place, and time.  Skin: Skin is warm and dry.  Psychiatric: He has a normal mood and affect. His behavior is normal.          Assessment & Plan:

## 2010-12-10 NOTE — Assessment & Plan Note (Signed)
Pt is taking Pravastatin but has been away from home and has bee drinking more lately. Plan to recheck his FLP at his next visit in 1 week (RN visit) because he is not fasting today.

## 2010-12-10 NOTE — Assessment & Plan Note (Signed)
Pt went to the Ed on 11-19-10 with some high blood sugars but was also found to have an elevated ammonia level and low sodium and high potassium (likely because of being hemolyzed). Will have him come back in 1 week to recheck his electrolytes and ammonia level.

## 2010-12-10 NOTE — Patient Instructions (Signed)
Keep taking your insulin and blood pressure meds.  You should be testing your blood sugar at least 3 times a day.  Call to make your eye appointment by 02-14-11 with [facility].  Come in 1 week to get your BP rechecked and get fasting labs.  Come in3 months (02-21-11) to have another diabetes check up office visit and get your A1c checked.  It was 10.3 today. Your goal is around 6.5

## 2010-12-14 NOTE — Assessment & Plan Note (Addendum)
Pt's BP very elevated today. Pt has not taken his BP meds all weekend and just took them right before he came in today. Stressed the importance of his medications. Asked him to come back in 1 week to get fasting labs and get his BP rechecked.

## 2011-01-13 LAB — COMPREHENSIVE METABOLIC PANEL
ALT: 26 U/L (ref 0–53)
AST: 29 U/L (ref 0–37)
AST: 38 U/L — ABNORMAL HIGH (ref 0–37)
Albumin: 3.3 g/dL — ABNORMAL LOW (ref 3.5–5.2)
Albumin: 4.2 g/dL (ref 3.5–5.2)
Alkaline Phosphatase: 90 U/L (ref 39–117)
BUN: 10 mg/dL (ref 6–23)
BUN: 17 mg/dL (ref 6–23)
CO2: 24 mEq/L (ref 19–32)
Calcium: 8.3 mg/dL — ABNORMAL LOW (ref 8.4–10.5)
Chloride: 97 mEq/L (ref 96–112)
Creatinine, Ser: 1.21 mg/dL (ref 0.4–1.5)
Creatinine, Ser: 2.05 mg/dL — ABNORMAL HIGH (ref 0.4–1.5)
GFR calc Af Amer: 43 mL/min — ABNORMAL LOW (ref >60)
GFR calc Af Amer: >60 mL/min (ref >60)
GFR calc non Af Amer: >60 mL/min (ref >60)
Glucose, Bld: 352 mg/dL — ABNORMAL HIGH (ref 70–99)
Potassium: 4.2 mEq/L (ref 3.5–5.1)
Potassium: 4.8 mEq/L (ref 3.5–5.1)
Sodium: 132 mEq/L — ABNORMAL LOW (ref 135–145)
Total Bilirubin: 1.4 mg/dL — ABNORMAL HIGH (ref 0.3–1.2)
Total Protein: 6.8 g/dL (ref 6.0–8.3)
Total Protein: 8.3 g/dL (ref 6.0–8.3)

## 2011-01-13 LAB — POCT CARDIAC MARKERS
CKMB, poc: 1.1 ng/mL (ref 1.0–8.0)
Myoglobin, poc: 203 ng/mL (ref 12–200)
Troponin i, poc: <0.05 ng/mL (ref 0.00–0.09)

## 2011-01-13 LAB — BASIC METABOLIC PANEL
BUN: 12 mg/dL (ref 6–23)
BUN: 16 mg/dL (ref 6–23)
CO2: 18 mEq/L — ABNORMAL LOW (ref 19–32)
CO2: 23 mEq/L (ref 19–32)
Calcium: 8.7 mg/dL (ref 8.4–10.5)
Calcium: 8.7 mg/dL (ref 8.4–10.5)
Chloride: 100 mEq/L (ref 96–112)
Chloride: 104 mEq/L (ref 96–112)
Chloride: 94 mEq/L — ABNORMAL LOW (ref 96–112)
Creatinine, Ser: 1.3 mg/dL (ref 0.4–1.5)
Creatinine, Ser: 1.64 mg/dL — ABNORMAL HIGH (ref 0.4–1.5)
GFR calc Af Amer: 56 mL/min — ABNORMAL LOW (ref >60)
GFR calc Af Amer: >60 mL/min (ref >60)
GFR calc non Af Amer: 44 mL/min — ABNORMAL LOW (ref >60)
GFR calc non Af Amer: 46 mL/min — ABNORMAL LOW (ref >60)
GFR calc non Af Amer: 60 mL/min — ABNORMAL LOW (ref >60)
Glucose, Bld: 143 mg/dL — ABNORMAL HIGH (ref 70–99)
Glucose, Bld: 309 mg/dL — ABNORMAL HIGH (ref 70–99)
Glucose, Bld: 394 mg/dL — ABNORMAL HIGH (ref 70–99)
Potassium: 4.4 mEq/L (ref 3.5–5.1)
Potassium: 4.7 mEq/L (ref 3.5–5.1)
Potassium: 5 mEq/L (ref 3.5–5.1)
Sodium: 131 mEq/L — ABNORMAL LOW (ref 135–145)
Sodium: 138 mEq/L (ref 135–145)
Sodium: 140 mEq/L (ref 135–145)

## 2011-01-13 LAB — CBC
HCT: 42.8 % (ref 39.0–52.0)
HCT: 46 % (ref 39.0–52.0)
Hemoglobin: 14.3 g/dL (ref 13.0–17.0)
MCV: 96.8 fL (ref 78.0–100.0)
Platelets: 287 10*3/uL (ref 150–400)
RDW: 14.8 % (ref 11.5–15.5)
WBC: 14.9 10*3/uL — ABNORMAL HIGH (ref 4.0–10.5)
WBC: 9.1 10*3/uL (ref 4.0–10.5)

## 2011-01-13 LAB — URINALYSIS, ROUTINE W REFLEX MICROSCOPIC
Glucose, UA: >1000 mg/dL — AB
Ketones, ur: 40 mg/dL — AB
Leukocytes, UA: NEGATIVE
Nitrite: NEGATIVE
Protein, ur: 100 mg/dL — AB
Urobilinogen, UA: 0.2 mg/dL (ref 0.0–1.0)

## 2011-01-13 LAB — GLUCOSE, CAPILLARY
Glucose-Capillary: 142 mg/dL — ABNORMAL HIGH (ref 70–99)
Glucose-Capillary: 162 mg/dL — ABNORMAL HIGH (ref 70–99)
Glucose-Capillary: 168 mg/dL — ABNORMAL HIGH (ref 70–99)
Glucose-Capillary: 196 mg/dL — ABNORMAL HIGH (ref 70–99)
Glucose-Capillary: 208 mg/dL — ABNORMAL HIGH (ref 70–99)
Glucose-Capillary: 210 mg/dL — ABNORMAL HIGH (ref 70–99)
Glucose-Capillary: 246 mg/dL — ABNORMAL HIGH (ref 70–99)
Glucose-Capillary: 269 mg/dL — ABNORMAL HIGH (ref 70–99)
Glucose-Capillary: 275 mg/dL — ABNORMAL HIGH (ref 70–99)
Glucose-Capillary: 285 mg/dL — ABNORMAL HIGH (ref 70–99)
Glucose-Capillary: 373 mg/dL — ABNORMAL HIGH (ref 70–99)
Glucose-Capillary: 376 mg/dL — ABNORMAL HIGH (ref 70–99)
Glucose-Capillary: 410 mg/dL — ABNORMAL HIGH (ref 70–99)

## 2011-01-13 LAB — HEMOGLOBIN A1C: Hgb A1c MFr Bld: 10.4 % — ABNORMAL HIGH (ref 4.6–6.1)

## 2011-01-13 LAB — URINE MICROSCOPIC-ADD ON

## 2011-01-13 LAB — DIFFERENTIAL
Eosinophils Relative: 0 % (ref 0–5)
Lymphocytes Relative: 12 % (ref 12–46)
Lymphs Abs: 1.8 10*3/uL (ref 0.7–4.0)
Monocytes Absolute: 0.4 10*3/uL (ref 0.1–1.0)
Monocytes Relative: 3 % (ref 3–12)
Neutro Abs: 12.6 10*3/uL — ABNORMAL HIGH (ref 1.7–7.7)

## 2011-01-13 LAB — LIPID PANEL
Cholesterol: 246 mg/dL — ABNORMAL HIGH (ref 0–200)
Total CHOL/HDL Ratio: 5.1 RATIO
VLDL: 59 mg/dL — ABNORMAL HIGH (ref 0–40)

## 2011-01-13 LAB — CARDIAC PANEL(CRET KIN+CKTOT+MB+TROPI)
CK, MB: 2.3 ng/mL (ref 0.3–4.0)
Relative Index: 1.3 (ref 0.0–2.5)
Total CK: 179 U/L (ref 7–232)
Troponin I: 0.03 ng/mL (ref 0.00–0.06)

## 2011-01-13 LAB — KETONES, QUALITATIVE: Acetone, Bld: NEGATIVE

## 2011-01-13 LAB — CK TOTAL AND CKMB (NOT AT ARMC): Relative Index: 1.2 (ref 0.0–2.5)

## 2011-01-17 ENCOUNTER — Telehealth: Payer: Self-pay | Admitting: Family Medicine

## 2011-01-17 NOTE — Telephone Encounter (Signed)
Ms. Jeffrey [name] calling regarding FMLA received for dates of services of Feb 13.  Pt actually started missing time 11/06/10.  Want to know if provider is willing to certify that patient was under physician's care for FMLA at that time.  Need clarification of this before documentation can be processed. Please asap.

## 2011-01-17 NOTE — Telephone Encounter (Signed)
I called her and left a message stating that he had been a pateint here for many years. If she needs to know more information than she can call back.

## 2011-01-18 ENCOUNTER — Telehealth: Payer: Self-pay | Admitting: Family Medicine

## 2011-01-18 NOTE — Telephone Encounter (Signed)
Pt's last OV ws 12/10/10.  Paperwork placed in Dr. Clarnce Flock box.

## 2011-01-18 NOTE — Telephone Encounter (Signed)
Patient dropped off disability forms to be filled out, placed on [employer].

## 2011-01-20 ENCOUNTER — Emergency Department (HOSPITAL_COMMUNITY)
Admission: EM | Admit: 2011-01-20 | Discharge: 2011-01-21 | Disposition: A | Discharge status: Other Acute Inpatient Hospital | Payer: BC Managed Care – PPO | Source: Home / Self Care | Attending: Emergency Medicine | Admitting: Emergency Medicine

## 2011-01-20 DIAGNOSIS — E860 Dehydration: Secondary | ICD-10-CM | POA: Insufficient documentation

## 2011-01-20 DIAGNOSIS — E119 Type 2 diabetes mellitus without complications: Secondary | ICD-10-CM | POA: Insufficient documentation

## 2011-01-20 DIAGNOSIS — Z794 Long term (current) use of insulin: Secondary | ICD-10-CM | POA: Insufficient documentation

## 2011-01-20 DIAGNOSIS — N289 Disorder of kidney and ureter, unspecified: Secondary | ICD-10-CM | POA: Insufficient documentation

## 2011-01-20 LAB — BLOOD GAS, VENOUS
Acid-Base Excess: 0.3 mmol/L (ref 0.0–2.0)
Patient temperature: 98.6
pCO2, Ven: 48.1 mmHg (ref 45.0–50.0)

## 2011-01-20 LAB — GLUCOSE, CAPILLARY: Glucose-Capillary: >600 mg/dL (ref 70–99)

## 2011-01-20 NOTE — Telephone Encounter (Signed)
Done, Thanks.

## 2011-01-21 ENCOUNTER — Encounter: Payer: Self-pay | Admitting: Family Medicine

## 2011-01-21 ENCOUNTER — Inpatient Hospital Stay (HOSPITAL_COMMUNITY)
Admission: EM | Admit: 2011-01-21 | Discharge: 2011-01-23 | DRG: 294 | Disposition: A | Discharge status: Home / Self Care | Payer: BC Managed Care – PPO | Source: Ambulatory Visit | Attending: Family Medicine | Admitting: Family Medicine

## 2011-01-21 DIAGNOSIS — E785 Hyperlipidemia, unspecified: Secondary | ICD-10-CM | POA: Diagnosis present

## 2011-01-21 DIAGNOSIS — E860 Dehydration: Secondary | ICD-10-CM | POA: Diagnosis present

## 2011-01-21 DIAGNOSIS — A088 Other specified intestinal infections: Secondary | ICD-10-CM | POA: Diagnosis present

## 2011-01-21 DIAGNOSIS — N289 Disorder of kidney and ureter, unspecified: Secondary | ICD-10-CM | POA: Diagnosis present

## 2011-01-21 DIAGNOSIS — Z9119 Patient's noncompliance with other medical treatment and regimen: Secondary | ICD-10-CM

## 2011-01-21 DIAGNOSIS — G4700 Insomnia, unspecified: Secondary | ICD-10-CM | POA: Diagnosis present

## 2011-01-21 DIAGNOSIS — I10 Essential (primary) hypertension: Secondary | ICD-10-CM | POA: Diagnosis present

## 2011-01-21 DIAGNOSIS — Z01812 Encounter for preprocedural laboratory examination: Secondary | ICD-10-CM

## 2011-01-21 DIAGNOSIS — E118 Type 2 diabetes mellitus with unspecified complications: Secondary | ICD-10-CM

## 2011-01-21 DIAGNOSIS — E1169 Type 2 diabetes mellitus with other specified complication: Principal | ICD-10-CM | POA: Diagnosis present

## 2011-01-21 DIAGNOSIS — Z7982 Long term (current) use of aspirin: Secondary | ICD-10-CM

## 2011-01-21 DIAGNOSIS — Z794 Long term (current) use of insulin: Secondary | ICD-10-CM

## 2011-01-21 DIAGNOSIS — Z91199 Patient's noncompliance with other medical treatment and regimen due to unspecified reason: Secondary | ICD-10-CM

## 2011-01-21 DIAGNOSIS — E876 Hypokalemia: Secondary | ICD-10-CM | POA: Diagnosis present

## 2011-01-21 LAB — BASIC METABOLIC PANEL
BUN: 38 mg/dL — ABNORMAL HIGH (ref 6–23)
CO2: 25 mEq/L (ref 19–32)
CO2: 29 mEq/L (ref 19–32)
Chloride: 95 mEq/L — ABNORMAL LOW (ref 96–112)
Chloride: 93 mEq/L — ABNORMAL LOW (ref 96–112)
Creatinine, Ser: 1.74 mg/dL — ABNORMAL HIGH (ref 0.4–1.5)
GFR calc Af Amer: 51 mL/min — ABNORMAL LOW (ref >60)
GFR calc Af Amer: 55 mL/min — ABNORMAL LOW (ref >60)
Potassium: 4.2 mEq/L (ref 3.5–5.1)
Potassium: 3.6 mEq/L (ref 3.5–5.1)
Sodium: 133 mEq/L — ABNORMAL LOW (ref 135–145)

## 2011-01-21 LAB — URINALYSIS, ROUTINE W REFLEX MICROSCOPIC
Glucose, UA: >1000 mg/dL — AB
Ketones, ur: 40 mg/dL — AB
Leukocytes, UA: NEGATIVE
Protein, ur: NEGATIVE mg/dL
Urobilinogen, UA: 0.2 mg/dL (ref 0.0–1.0)

## 2011-01-21 LAB — DIFFERENTIAL
Basophils Absolute: 0 10*3/uL (ref 0.0–0.1)
Basophils Relative: 0 % (ref 0–1)
Eosinophils Absolute: 0 10*3/uL (ref 0.0–0.7)
Eosinophils Relative: 0 % (ref 0–5)
Monocytes Absolute: 1.6 10*3/uL — ABNORMAL HIGH (ref 0.1–1.0)
Monocytes Relative: 9 % (ref 3–12)
Neutro Abs: 13.7 10*3/uL — ABNORMAL HIGH (ref 1.7–7.7)

## 2011-01-21 LAB — POCT I-STAT, CHEM 8
Calcium, Ion: 0.99 mmol/L — ABNORMAL LOW (ref 1.12–1.32)
Creatinine, Ser: 2.5 mg/dL — ABNORMAL HIGH (ref 0.4–1.5)
Glucose, Bld: 673 mg/dL (ref 70–99)
HCT: 59 % — ABNORMAL HIGH (ref 39.0–52.0)
Hemoglobin: 20.1 g/dL — ABNORMAL HIGH (ref 13.0–17.0)
Potassium: 5 mEq/L (ref 3.5–5.1)
TCO2: 29 mmol/L (ref 0–100)

## 2011-01-21 LAB — GLUCOSE, CAPILLARY
Glucose-Capillary: 532 mg/dL — ABNORMAL HIGH (ref 70–99)
Glucose-Capillary: 385 mg/dL — ABNORMAL HIGH (ref 70–99)
Glucose-Capillary: 163 mg/dL — ABNORMAL HIGH (ref 70–99)
Glucose-Capillary: 112 mg/dL — ABNORMAL HIGH (ref 70–99)
Glucose-Capillary: 279 mg/dL — ABNORMAL HIGH (ref 70–99)
Glucose-Capillary: 230 mg/dL — ABNORMAL HIGH (ref 70–99)
Glucose-Capillary: 226 mg/dL — ABNORMAL HIGH (ref 70–99)
Glucose-Capillary: 339 mg/dL — ABNORMAL HIGH (ref 70–99)

## 2011-01-21 LAB — URINE MICROSCOPIC-ADD ON

## 2011-01-21 LAB — CBC
MCH: 31.7 pg (ref 26.0–34.0)
MCHC: 34.6 g/dL (ref 30.0–36.0)
RDW: 13.2 % (ref 11.5–15.5)

## 2011-01-21 LAB — SODIUM, URINE, RANDOM: Sodium, Ur: 11 mEq/L

## 2011-01-21 NOTE — H&P (Signed)
[facility] Admission R2 Addendum  Patient name: Jeffrey [name] Medical record number: [id number] Date of birth: 12/01/1965 Age: 45 y.o. Gender: male  Primary Care Provider: Esmeralda Arthur, MD  Chief Complaint: hyperglycemia, gastroenteritis History of Present Illness: Jeffrey [name] is a 45 y.o. year old male presenting with nausea, vomiting, diarrhea over last 4-5 days. Pt was in otherwise normal state of health when pt began having significant nausea and vomiting around 01/17/11. Unsure of sick contacts. Pt unable to tolerate any po intake over approx 3 days per pt. Emesis non bilious, initially non bloody. Became blood tinged over last 24-48 hours with dry heaving per pt.  Also with persistent diarrhea. Diarrhea non bloody/non melanotic per pt. Pt reports taking lantus throughout course of illness. Has not been able to tolerate other po medications until yesterday (01/20/11). Pt states that he checked blood sugars over course of illness with glucose meter reading only "high". No fevers, HA, SOB. + Intermittent CP. No active CP for 24 hours. No dysuria, polyuria per pt. Pt presented to ED secondary to concern for persistent decreased po intake. In ED, pt noted to have CBG in 600s and Cr 2.5   Patient Active Problem List  Diagnoses  . DIABETES MELLITUS II, UNCOMPLICATED  . HYPERCHOLESTEROLEMIA  . OBESITY, NOS  . ANXIETY  . ERECTILE DYSFUNCTION  . INSOMNIA, CHRONIC  . 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 HYPERTENSION, BENIGN SYSTEMIC  . ARTHRITIS  . EDEMA  . ALCOHOL ABUSE, HX OF   For Meds, All, PMH, Surg Hx, Social Hx; see excellent R1 note.  Review Of Systems: 12 point review of systems negative except as noted above in HPI  Physical Exam: Pulse: 75  Blood Pressure:125/82 RR:20   O2:100% on 2L Temp:98.4  General: alert and cooperative HEENT: PERRLA, extra ocular movement intact and dry oral mucosa  Heart: S1, S2 normal, no murmur, rub or  gallop, regular rate and rhythm Lungs: clear to auscultation, no wheezes or rales, unlabored breathing and no kusmal breath.  Abdomen: abdomen is soft without significant tenderness, masses, organomegaly or guarding Extremities: extremities normal, atraumatic, no cyanosis or edema and 2+ peripheral pulses, no edema Skin:no rashes, no ecchymoses, no petechiae Neurology: normal without focal findings, mental status, speech normal, alert and oriented x3, PERLA, reflexes normal and symmetric and proprioception intact bilaterally   Labs and Imaging: Lab Results  Component Value Date/Time   NA 124* 01/20/2011 11:58 PM   K 5.0 01/20/2011 11:58 PM   CL 85* 01/20/2011 11:58 PM   CO2 27 11/19/2010  4:00 PM   BUN 53* 01/20/2011 11:58 PM   CREATININE 2.5* 01/20/2011 11:58 PM   GLUCOSE 673* 01/20/2011 11:58 PM   Lab Results  Component Value Date   WBC 17.8* 01/20/2011   HGB 20.1* 01/20/2011   HCT 59.0* 01/20/2011   MCV 91.8 01/20/2011   PLT 366 01/20/2011   Color, Urine                         YELLOW                 YELLOW  Appearance                        CLEAR                    CLEAR  Specific Gravity  1.028                       1.005-1.030  pH                                       5.5                           5.0-8.0  Urine Glucose                     >1000      a             NEG              mg/dL  Bilirubin                               MODERATE   a      NEG  Ketones                               40         a                NEG              mg/dL  Blood                                   SMALL      a            NEG  Protein                                 NEGATIVE             NEG              mg/dL  Urobilinogen                         0.2               0.0-1.0          mg/dL  Nitrite                                    NEGATIVE            NEG  Leukocytes                           NEGATIVE            NEG  VBG- 7.35/48.1/26/pO248.1   Assessment and Plan:  Jeffrey [name] is  a 45 y.o. year old male presenting with hyperglycemia and gastroenteritis  Hyeprglycemia- Intital blood sugars in 600s per ED report. No noted acidosis on VBG. No true BMET ( labs form chem 8) to actually coorelate anion gap. However, blood sugars noted to be near 300s with IVF and insulin gtt on arrival from ED. Will continue pt on glucomander with anticipation  of rapid bridging to SSI coverage. Will check stat BMET. Most recent A1C @ 10.3 11/2010. Will recheck A1C.   Dehydration- Pt with noted Cr. 2.5 in setting of baseline Cr @ around 1-1.2. Likely prerenal in etiology given viral decreased po intake and ongoing losses. Will check FeNA. Will also aggressively hydrate pt. Will hold baseline ARB and diuretic in setting of ARF.   Leukocytosis- Likely secondary to viral GI process. Also likely contribution of hemoconcentration given volume status. No fever. UA negative for infection. Will consider broad spectrum coverage if pt spikes fever or decompensates.   HTN- Will continue home metoprolol. Will hold on cozaar and lasix.   Insomnia- Continue trazodone.   Edema- none present on exam in setting of volume status. Will hold lasix in setting of ARF.   HLD- Cont pravachol. Will check FLP.   FEN/GI: 1L NS bolus x 1. NS @ 175 ccs/hr per glucomander protocol. Carb Modified diet. Prn zofran for nausea.  Prophylaxis: heparin, protonix Disposition:Pending further evaluation

## 2011-01-21 NOTE — H&P (Signed)
[facility] Admission History and Physical  Patient name: Jeffrey [name] Medical record number: [id number] Date of birth: 07-12-1966 Age: 45 y.o. Gender: male  Primary Care Provider: Esmeralda Arthur, MD  Chief Complaint: hyperglycemia, N/V/D  History of Present Illness: Jeffrey [name] is a 45 y.o. year old male presenting with nausea, vomiting, and elevated blood sugars.  Patient states his glucose was "too elevated" for meter to detect.  Symptoms started 4 days ago.  He has not eaten or had anything to drink in 3 days.  Patient also complains of headache, dry mouth, polyuria, and epigastric tenderness.  He had a few episodes of diarrhea over the weekend.  Patient admits to not taking medications compliantly.  He uses Lantus 70 units SQ daily, but does not have time to check CBGs or use Novolog at work.  Prior to this hyperglycemic episode, patient says he was in his normal state of health and CBGs ranged from 120s- 200s.  Because patient knew his CBGs and likely BP were elevated, patient went to ED to be evaluated.  ROS: Endorses HA, dry mouth, N/V, epigastric pain, dyspnea, and burning upon urination.  Denies fever, chills, blurry vision, numbness/tingling in extremities.  Patient Active Problem List  Diagnoses  . DIABETES MELLITUS II, UNCOMPLICATED  . HYPERCHOLESTEROLEMIA  . OBESITY, NOS  . ANXIETY  . ERECTILE DYSFUNCTION  . INSOMNIA, CHRONIC  . 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 HYPERTENSION, BENIGN SYSTEMIC  . ARTHRITIS  . EDEMA  . ALCOHOL ABUSE, HX OF   Past Medical History:  1. Significant for hypertension.   2. Diabetes type 2, now on insulin only therapy.   3. History of hyperlipidemia.   4. Chronic alcoholism with history of DTs.   5. History of pancreatitis for which he was admitted in 2001, 2003,       and 2007.  Past Surgical History: Leg surgery  Social History: The patient lives with his wife and 4 children.  He does  have a history of alcohol abuse as described above.  Denies any   tobacco or other drug use.   The patient is currently employed as a Dealer.   Family History: Mother deceased at age 29 secondary to cerebral hmorrhage complications and hypertension.  Father is alive and well.  The patient has 3 sisters, all of whom are healthy.  The patient's children are healthy.    Allergies: Allergies  Allergen Reactions  . Lisinopril     Current Outpatient Prescriptions  Medication Sig Dispense Refill  . amLODipine (NORVASC) 5 MG tablet Take 5 mg by mouth daily.        [name] aspirin (ADULT ASPIRIN EC LOW STRENGTH) 81 MG EC tablet Take 81 mg by mouth daily.        . furosemide (LASIX) 40 MG tablet Take 40 mg by mouth daily.        [name] glucose blood (ASCENSIA ELITE TEST STRIPS) test strip 1 each by Other route as needed. Use as instructed       . insulin aspart (NOVOLOG) 100 UNIT/ML injection 5 units with 2 large meals a day  10 mL  3  . insulin glargine (LANTUS SOLOSTAR) 100 UNIT/ML injection Inject 60 Units into the skin at bedtime. 35 units in the morning and 35 in the evening  10 mL  6  . losartan (COZAAR) 50 MG tablet Take 50 mg by mouth daily.        . metoprolol (TOPROL-XL) 100  MG 24 hr tablet Take 100 mg by mouth every 12 (twelve) hours.        [name] NEEDLE, DISP, 27 G (BD DISP NEEDLES) 27G X 1/2" MISC by Does not apply route as directed.        . pravastatin (PRAVACHOL) 80 MG tablet Take 80 mg by mouth at bedtime. For cholesterol this replaces simvastatin       . sildenafil (VIAGRA) 100 MG tablet Take 100 mg by mouth. Take 1 tablet 30 minutes to 4 hours before intercourse       . traZODone (DESYREL) 50 MG tablet Take 50 mg by mouth at bedtime. Take 1 -1.5 pills for sleep        Review Of Systems: Per HPI  Physical Exam: Pulse: 75  Blood Pressure: 125/82 RR: 20   O2: 100% on 2L Temp: 98.4  General: alert, cooperative, moderately obese and appears dehydrated HEENT: NCAT.  PERRLA.  EOMI.  Mild  scleral icterus. Oropharynx is dry without erythema or exudates.  Poor dentition. Heart: regular rate and rhythm, no edema or JVD, no diastolic murmurs heard Lungs: clear to auscultation, no wheezes or rales and unlabored breathing Abdomen: mild tenderness in the mid-epigastric region. Extremities: extremities normal, atraumatic, no cyanosis or edema Neurology: normal without focal findings, mental status, speech normal, alert and oriented x3, cranial nerves 2-12 intact, muscle tone and strength normal and symmetric and sensation grossly normal  Labs and Imaging: Lab Results  Component Value Date/Time   NA 124* 01/20/2011 11:58 PM   K 5.0 01/20/2011 11:58 PM   CL 85* 01/20/2011 11:58 PM   CO2 27 11/19/2010  4:00 PM   BUN 53* 01/20/2011 11:58 PM   CREATININE 2.5* 01/20/2011 11:58 PM   GLUCOSE 673* 01/20/2011 11:58 PM   Lab Results  Component Value Date   WBC 17.8* 01/20/2011   HGB 20.1* 01/20/2011   HCT 59.0* 01/20/2011   MCV 91.8 01/20/2011   PLT 366 01/20/2011   UA: >1000 glucose, 40 ketones, and small blood  Assessment and Plan: Jeffrey [name] is a 45 y.o. year old male presenting with nausea, vomiting, decreased po intake; found to have CBG of 673 and appears dehydrated: 1. Hyperglycemia: likely multi-factorial in etiology.  Patient has a history of non-compliance and does not take Novolog SSI as directed.  Additionally, he endorses symptoms consistent with viral gastroenteritis which caused dehydration that may have led to hyperglycemia.  At [facility], patient's serum glucose was initially 673.  Patient was started on IVF and insulin and CBG decreased to 504.  Upon admission, patient's CBG was 289.  Will order a BMET Stat to calculate anion gap.  Will repeat BMET at 1000.  Will re-start Glucomander until patient's CBGs <250, then will transition to half home dose of Lantus and SSI.  NS @ 175 cc/hr.  Will replete electrolytes as necessary.  Will give Zofran prn for nausea or vomiting.   Patient's last A1c in the office was 10.3 (2/12).  Will repeat A1c.  Will monitor closely. 2:   HTN: BP currently stable.  Will hold Cozaar and Lasix in lieu of ARI.  Will continue Toprol 100 BID.  Will monitor BP and consider Hydralazine prn if BP is uncontrolled. 3.   HLD: Will order FLP.  Continue with Pravastatin 80 qhs.   4.   Acute renal insufficiency: likely secondary to dehydration.  Will continue to hydrate with IVF.  Will repeat BMET.  Will calculate FeNa in AM. 5.  Insomnia: Will continue Trazodone 50 po qhs.  6. FEN/GI: CHO modified.  NS @ 175 cc/ hr, then D5 1/2 NS at same rate.  7. Prophylaxis: Heparin 5000 units SQ TID.  Protonix 40mg  PO daily. 8. Disposition: pending clinical improvement, ability to tolerate po, and stable CBGs

## 2011-01-22 ENCOUNTER — Ambulatory Visit: Payer: BC Managed Care – PPO | Admitting: Family Medicine

## 2011-01-22 ENCOUNTER — Telehealth: Payer: Self-pay | Admitting: Family Medicine

## 2011-01-22 DIAGNOSIS — E860 Dehydration: Secondary | ICD-10-CM

## 2011-01-22 DIAGNOSIS — E118 Type 2 diabetes mellitus with unspecified complications: Secondary | ICD-10-CM

## 2011-01-22 DIAGNOSIS — E785 Hyperlipidemia, unspecified: Secondary | ICD-10-CM

## 2011-01-22 DIAGNOSIS — I10 Essential (primary) hypertension: Secondary | ICD-10-CM

## 2011-01-22 LAB — BASIC METABOLIC PANEL
BUN: 12 mg/dL (ref 6–23)
CO2: 29 mEq/L (ref 19–32)
Calcium: 8.1 mg/dL — ABNORMAL LOW (ref 8.4–10.5)
Creatinine, Ser: 1.13 mg/dL (ref 0.4–1.5)
GFR calc Af Amer: >60 mL/min (ref >60)

## 2011-01-22 LAB — CBC
MCH: 31 pg (ref 26.0–34.0)
MCHC: 34.1 g/dL (ref 30.0–36.0)
Platelets: 243 10*3/uL (ref 150–400)

## 2011-01-22 LAB — GLUCOSE, CAPILLARY
Glucose-Capillary: 241 mg/dL — ABNORMAL HIGH (ref 70–99)
Glucose-Capillary: 221 mg/dL — ABNORMAL HIGH (ref 70–99)

## 2011-01-22 LAB — URINE CULTURE

## 2011-01-22 NOTE — Telephone Encounter (Signed)
Pt is wanting paperwork for employer today she has to turn it in by 4/19.Jeffrey [name] [place]

## 2011-01-22 NOTE — Telephone Encounter (Signed)
Please let her know that she can pick it up today at noon. I finished it tonight.

## 2011-01-22 NOTE — Telephone Encounter (Signed)
Wife, Jeffrey [name] stopped by the office to find out where the papers are, she is needing to turn these in to pts employer by the 19th. Please call wife, pt is in the hospital.

## 2011-01-23 LAB — BASIC METABOLIC PANEL
CO2: 31 mEq/L (ref 19–32)
Calcium: 8.6 mg/dL (ref 8.4–10.5)
Chloride: 100 mEq/L (ref 96–112)
Glucose, Bld: 207 mg/dL — ABNORMAL HIGH (ref 70–99)
Sodium: 138 mEq/L (ref 135–145)

## 2011-01-23 LAB — CBC
HCT: 37.8 % — ABNORMAL LOW (ref 39.0–52.0)
MCH: 30.5 pg (ref 26.0–34.0)
MCV: 92.2 fL (ref 78.0–100.0)
Platelets: 209 10*3/uL (ref 150–400)
RBC: 4.1 MIL/uL — ABNORMAL LOW (ref 4.22–5.81)
RDW: 13.1 % (ref 11.5–15.5)

## 2011-01-23 LAB — GLUCOSE, CAPILLARY

## 2011-01-23 NOTE — Telephone Encounter (Signed)
Spoke with patient and informed her of the papers ready to be picked up

## 2011-01-25 ENCOUNTER — Inpatient Hospital Stay: Payer: BC Managed Care – PPO | Admitting: Family Medicine

## 2011-01-26 NOTE — H&P (Signed)
NAMEJERAMIH, BATTANI              ACCOUNT NO.:  [id number]  MEDICAL RECORD NO.:  [id number]           PATIENT TYPE:  E  LOCATION:  WLED                         FACILITY:  [facility]  PHYSICIAN:  Felicidad Sugarman A. Walker Kehr, M.D.    DATE OF BIRTH:  01/30/1966  DATE OF ADMISSION:  01/20/2011 DATE OF DISCHARGE:  01/21/2011                             HISTORY & PHYSICAL   PRIMARY CARE PROVIDER:  Esmeralda Arthur, MD at [facility].  CHIEF COMPLAINT:  Hyperglycemia, nausea, vomiting, and diarrhea.  HISTORY OF PRESENT ILLNESS:  Boyd Magsino is a 45 year old male presenting with nausea, vomiting, and elevated blood sugars.  The patient states that his glucose was too elevated for the meter to detect.  Symptoms started 4 days ago.  The patient says he has not eaten or had anything to drink in the last 3 days.  The patient also complains of headache, dry mouth, polyuria, and epigastric tenderness.  He had a few episodes of diarrhea over the weekend.  The patient admits to not taking medications compliantly.  He uses Lantus 70 units subcu daily, but does not have time to check CBGs or use NovoLog sliding scale work. Prior to this hypoglycemic episode, the patient says he was in his normal state of health and CBGs ranged from 120s to 200s.  Because the patient knew his CBGs were elevated and likely his blood pressure too, the patient went to the ED to be evaluated.  REVIEW OF SYSTEMS:  Endorses headache, dry mouth, nausea, vomiting, epigastric pain, dyspnea, and burning upon urination.  Denies fever, chills, blurry vision, numbness, tingling in extremities.  PAST MEDICAL HISTORY: 1. Diabetes mellitus, type 2, insulin dependent. 2. Hyperlipidemia. 3. Obesity. 4. Anxiety. 5. Erectile dysfunction. 6. Insomnia. 7. Hypertension. 8. Arthritis. 9. Edema.  PAST SURGICAL HISTORY:  Leg surgery.  SOCIAL HISTORY:  The patient lives with his wife and four children.  He does have a  history of alcohol abuse.  Denies any tobacco or other drug use.  The patient is currently employed as a Dealer.  FAMILY HISTORY:  Mother deceased at age 78 secondary to cerebral hemorrhage.  Father is alive and well.  The patient has three sisters, all of whom are healthy.  The patient's children are also healthy.  ALLERGIES:  LISINOPRIL.  MEDICATIONS: 1. Norvasc 5, 1 tablet by mouth daily. 2. Aspirin 81 mg by mouth daily. 3. Lasix 40 mg 1 tablet by mouth daily. 4. NovoLog 5 units with two large meals per day. 5. Lantus 35 units subcu in the morning and 35 in the evening. 6. Cozaar 50 mg 1 tablet by mouth daily. 7. Toprol-XL 100 mg by mouth b.i.d. 8. Pravachol 80 mg 1 tablet by mouth at bedtime. 9. Viagra 100 mg 1 tablet 3 minutes to 4 hours before intercourse. 10.Trazodone 50 mg 1 tablet by mouth at bedtime.  REVIEW OF SYSTEMS:  Per HPI.  PHYSICAL EXAM:  VITAL SIGNS:  Pulse 75, blood pressure 125/82, respirations 20, O2 100% on 2 L, and temperature is 98.4. GENERAL:  Alert, cooperative, moderately obese, appears dehydrated. HEENT:  NCAT.  PERRLA.  EOMI.  Mild scleral icterus.  Oropharynx is dry without erythema or exudate.  Poor dentition. HEART:  Regular rate rhythm.  No edema or JVD.  No diastolic murmurs heard. LUNGS:  Clear to auscultation bilaterally.  No wheezes, rales, or unlabored breathing. ABDOMEN:  Mildly tender in the midepigastric region. EXTREMITIES:  Normal atraumatic.  No cyanosis or edema. NEUROLOGY:  Normal without focal findings.  Mental status and speech was normal.  The patient is alert and oriented x3.  Cranial nerves II-XII are intact.  Muscle tone and strength normal and symmetric and sensation grossly normal.  LABORATORIES AND IMAGING:  I-STAT sodium 124, potassium 5, chloride 85, CO2 of 27, BUN 53, creatinine 2.5, glucose 673.  White count 17.8, hemoglobin 20.1, hematocrit 59.0, platelets 266.  UA over 1000, glucose 40 ketones, small  blood.  ASSESSMENT AND PLAN:  Julus Rancourt is a 45 year old male presenting with nausea, vomiting, decreased p.o. intake, found to have a CBG of 673 and appears dehydrated. 1. Hyperglycemia likely multifactorial in etiology.  The patient has a     history of noncompliance and does not take his NovoLog sliding     scale as directed.  Additionally, he endorses symptoms consistent     with viral gastroenteritis, which may have caused the dehydration     that led to the hyperglycemia.  At [facility], the patient's serum     glucose was initially 673.  He was started on IV fluids and insulin     and his CBGs decreased to 504.  Upon admission at [facility], the     patient's CBG was 289.  We will order a BMET STAT to calculi anion     gap.  We will repeat the BMET at 10 a.m.  We will restart his     Glucommander until the patient's CBGs are less than 250, then we     will transition to him to half home dose of Lantus and start     sliding scale insulin.  We will start normal saline at 175 mL an     hour until the patient's CBGs are less than 250.  We will replete     his electrolytes as necessary.  We will give Zofran p.r.n. for     nausea or vomiting.  The patient's last A1c in the office was 10.3     in February 2012.  We will get a repeat A1c.  We will follow     closely. 2. Hypertension.  Blood pressure is currently stable.  We will hold     Cozaar and Lasix in lieu of his acute renal deficiency.  We will     continue Toprol 100 b.i.d.  If blood pressures are uncontrolled, we     can consider hydralazine p.r.n. 3. Hyperlipidemia.  We will get a fasting lipid panel and continue     with pravastatin 80 at bedtime. 4. Acute renal insufficiency, likely secondary to dehydration.  We     will continue to hydrate with IV fluids and repeat a BMET.  We will     calculate FeNa in the morning. 5. Insomnia.  We will continue trazodone 50 at bedtime. 6. Fluids, electrolytes,  nutrition/gastrointestinal.  Carb modified     diet, normal saline at 175 mL an hour, then D5     half normal saline at same rate. 7. Prophylaxis.  Heparin 5000 units subcu t.i.d., Protonix 40 mg p.o.     daily.  DISPOSITION:  Pending clinical improvement,  ability to tolerate p.o., and stable CBGs.    ______________________________ Donnamarie Rossetti, MD   ______________________________ Arty Baumgartner. Walker Kehr, M.D.    ID/MEDQ  D:  01/21/2011  T:  01/22/2011  Job:  [id number]  Electronically Signed by Donnamarie Rossetti MD on 01/26/2011 03:54:42 PM Electronically Signed by Candelaria Celeste M.D. on 01/26/2011 10:04:49 PM

## 2011-02-19 NOTE — H&P (Signed)
Jeffrey [name], Jeffrey [name]              ACCOUNT NO.:  [id number]   MEDICAL RECORD NO.:  [id number]          PATIENT TYPE:  INP   LOCATION:  3309                         FACILITY:  [facility]   PHYSICIAN:  Pendleton A. Walker Kehr, M.D.    DATE OF BIRTH:  Nov 12, 1965   DATE OF ADMISSION:  05/11/2009  DATE OF DISCHARGE:                              HISTORY & PHYSICAL   CHIEF COMPLAINT:  Syncope and high blood sugar.   HISTORY OF PRESENT ILLNESS:  Jeffrey [name] is a 45 year old African  American male with history of type 2 diabetes diagnosed in 2001, the  patient now only on insulin therapy.  The patient presented to the  emergency department after having a syncopal event at work today and  found to have a CBG of 353 in the ED.  The patient states that he was in  his usual state of health until past 2 days.  He reports an increase in  his home blood sugar readings between 400 and 600.  The patient reports  that usually his blood sugars are around 200 before he takes his Lantus  70 units at bedtime, but that his blood sugars may drop to 60-70s by the  morning.  The patient states he took his usual 70 units of Lantus at  approximately 9 p.m. last night before going to work.  He did not check  his CBG.  Reports feeling normal, however.  After arriving at work, he  had syncope.  The patient states he remembers lying under a motor to  work on it, next thing he noticed coworkers were trying to wake him.  He  states that they knew something was wrong with him because he was lying  on the ground, not moving.  They did not report any seizure-like  activity.  However, after coming to, the patient was disoriented, weak,  and had trouble walking.  The patient generally experienced some  confusion after the event.  The patient states that per his coworkers he  was out for between 5-10 minutes.  CBG taken at that time by one of his  coworkers is 353.  Currently, he is now back at his baseline mental  status and functioning.   The patient denies any chest pain, nausea,  vomiting, or recent illnesses.  He does endorse a history of heartburn,  but states that is a chronic issue.  He endorses occasional feelings  like his heart is fluttering, but denies any known heart disease,  coronary artery disease, cardiac problems, or murmurs.  The patient also  denies any past medical history of seizures or similar syncopal episodes  in the past.   PAST MEDICAL HISTORY:  1. Significant for hypertension.  2. Diabetes type 2, now on insulin only therapy.  3. History of hyperlipidemia.  4. Chronic alcoholism with history of DTs.  5. History of pancreatitis for which he was admitted in 2001, 2003,      and 2007.   FAMILY HISTORY:  Mother deceased at age 51 secondary to cerebral  hemorrhage complications and hypertension.  Father is alive and well.  The patient  has 3 sisters, all of whom are healthy.  The patient's  children are healthy.   SOCIAL HISTORY:  The patient lives with his wife and 4 children.  He  does have a history of alcohol abuse as described above.  Denies any  tobacco or other drug use.  The patient states that he drinks 1-40  ounces of beers everyday.  The patient is currently employed as a  Dealer.   MEDICATIONS:  1. Toprol-XL 100 mg p.o. b.i.d.  2. Aspirin 81 mg p.o. daily.  3. Lantus 70 units at bedtime.   ALLERGIES:  No known drug allergies.   REVIEW OF SYSTEMS:  No chest pain.  No shortness of breath.  No fevers  or chills.  No nausea or vomiting.  The patient does complain of  heartburn, which he says is not a new symptom for him.  Otherwise 12-  point review of systems was done and negative except for as in the HPI  above.   PHYSICAL EXAMINATION:  VITAL SIGNS:  Temperature 98.3 degrees, heart  rate 90, respirations 24, blood pressure 126/66, and O2 sat is 95% on  room air.  GENERAL APPEARANCE:  The patient in no apparent distress.  Alert and  cooperative.  HEENT:  Head is  normocephalic and atraumatic.  Pupils are equal, round,  and reactive to light and accommodation.  Extraocular movements intact.  Sclerae anicteric.  Tympanic membranes are clear bilaterally.  Moist  mucous membranes without erythema or exudate.  No oral lesions noted.  NECK:  Supple with no lymphadenopathy.  CARDIOVASCULAR:  Regular rate and rhythm.  Grade 2/6 systolic ejection  murmur that is best auscultated at the left sternal border.  LUNGS:  Clear to auscultation bilaterally.  No wheezing.  ABDOMEN:  Soft and obese.  Tender to palpation in the epigastric region.  Positive bowel sounds x4.  No organomegaly.  EXTREMITIES:  No edema, clubbing, or cyanosis.  Distal pulses are  palpable bilaterally.  MUSCULOSKELETAL:  Good muscle tone.  Good active and passive range of  motion.  NEURO:  Cranial nerves II through XII intact.  The patient with 5/5  upper and lower extremity strength bilaterally, symmetric.  Finger-to-  nose intact.  Alert and oriented x3.  Sensation is intact throughout.   LABORATORY DATA:  Sodium 135, potassium 4.8, chloride 93, bicarb 13, BUN  17, creatinine 2.05, glucose 427, and calcium 9.6.  CBC showed white  blood cells 14.9, hemoglobin 15.1, hematocrit 46, and platelets 287.  Lipase 11.  Protein 8.3, albumin 4.2, bilirubin 0.8, alk phos 122, AST  38, and ALT 28.  Cardiac enzymes, CK-MB 1.1, troponin less than 0.5, and  myoglobin 213.   Labs from office, HbA1c was 13.9% in December 2009.  FLP, cholesterol  158, triglycerides 121, HDL 40, and LDL 94 in February 2009.  Creatinine  was 1.37 in September 2009.  Anion gap was 29.   DIAGNOSTIC IMAGING:  Chest x-ray performed in ED showed no acute  disease.   ASSESSMENT AND PLAN:  A 45 year old African American male admitted with  diabetic ketoacidosis.  1. Diabetic ketoacidosis.  The patient received 2 normal saline      boluses in the ED.  He is now getting normal saline at 250 mL/h.      The patient was also  given 10 mEq of KCL per IV as potassium not as      high as one would expect, although sodium was not as low as you  would expect either.  Plan to give another liter of bolus and start      glucosteroid.  We will give normal saline 250 mL/h until CBGs less      than 250 and then we will switch IV fluids to D5 half normal saline      at 150 mL/h.  When CBG is less than 175 x4 hours, we would      transition to subcu insulin.  Begin with Lantus half normal dose      and moderate SSI.  When off IV insulin, change fluids back to      normal saline and carb modified diet as tolerated p.o.  We will      also plan to check HbAlc.  We will check BMET q.2 h. until anion      gap is closed and until the patient is off IV insulin.  It is      unclear what led to the patient's diabetic ketoacidosis      questionable noncompliance.  The patient states he possibly had a      bad insulin tin that he has been taking his insulin daily as      recommended.  The patient endorses severe gastroesophageal reflux      disease, so we will rule out myocardial infarction with cardiac      enzymes and repeat EKG.  No signs or symptoms of systemic illness.      The patient has a history of prior admissions for diabetic      ketoacidosis.  2. Syncope.  The illness could be described as a postictal state when      he came to after passing at work, though no unwitnessed seizure-      like activity.  The patient denied any history of seizure disorder      or seizure-like activity in the past.  It is possible this could      have been secondary to metabolic derangements.  Syncope is most      likely from his elevated blood sugar, and it has resolved now and      we will continue to monitor.  3. Heart murmur.  The patient denies any history of previous murmur.      We will need to check medical records to see if the patient has a      known history of murmur in the past.  Consider Cardiology consult      and 2-D echo  while the patient is admitted.  4. Gastroesophageal reflux disease.  We will need to rule out cardiac      etiology of chest pain.  I will give GI cocktail x1 now in the ED.      We will give Protonix daily.  The patient endorses that his      gastroesophageal reflux disease is a chronic problem, so would      likely benefit from PPI or H2 blocker at discharge.  5. Hypertension.  Plan to continue the patient's home dose of Toprol-      XL 100 mg p.o. b.i.d.  Given diabetes and the patient's elevated      creatinine, the patient would likely benefit from ACE  inhibitor.  6. Acute renal insufficiency.  The patient's creatinine today 2.05,      has been as high as 3.7 with his last diabetic ketoacidosis      admission.  When last checked as an outpatient, creatinine was 1.3  likely related to dehydration from diabetic ketoacidosis.  Plan to      give normal saline and reevaluate with next BMET.  7. FEN/GI.  The patient currently n.p.o.  As anion gap closes and we      will begin subcu insulin.  We will place the patient on carb      modified diet as tolerated.  I will continue with maintenance IV      fluids at 250 L/h now.  Electrolyte status is within normal limits      at this time.  We need to recheck kidney function in the a.m.  8. Prophylaxis.  Placed the patient on Protonix and heparin for      prophylaxis.  We will put b.i.d. heparin due to patient's elevated      creatinine.  9. Disposition.  Pending resolution of diabetic ketoacidosis and      stability of the patient's blood sugars on subcu regimen.  Also,      pending the patient's improved clinical appearance.      Annabell Sabal, MD  Electronically Waverly A. Walker Kehr, M.D.  Electronically Signed    JW/MEDQ  D:  05/11/2009  T:  05/11/2009  Job:  [id number]

## 2011-02-19 NOTE — Discharge Summary (Signed)
NAMEDARROW, EGELER              ACCOUNT NO.:  [id number]   MEDICAL RECORD NO.:  [id number]         PATIENT TYPE:  LINP   LOCATION:                                 FACILITY:   PHYSICIAN:  Durwin Nora, MDDATE OF BIRTH:  [birth year]   DATE OF ADMISSION:  10/10/2007  DATE OF DISCHARGE:  10/20/2007                               DISCHARGE SUMMARY   DISCHARGE DIAGNOSES:  1. Diabetic ketoacidosis, resolved.  2. Diabetes with poor control.  3. Chronic alcoholism.  4. Hypertension, stable.  5. Dehydration, improve  6. Morbid obesity.  7. History of pancreatitis.  8. History of hyperlipidemia.  9. Acute-on-chronic renal failure.  10.Anemia of chronic disease with macrocytosis.  11.Vitamin D deficiency.  12.Impotence.  13.Gram-positive rod sepsis.  14.Mild hypokalemia.  15.Hypophosphatemia.   CONSULT:  Dr. Jimmy Footman, nephrology.  Dr Melvyn Novas pulmonology   HOSPITAL COURSE:  This 45 year old African American male with history of  chronic alcoholism was initially admitted to the critical care service  on October 09, 2007, for diabetic ketoacidosis.  Noticed to have an  altered mental status.  Lipase was also elevated and he had lactic  acidosis.  His metformin and ACE inhibitors were discontinued because of  his increased renal dysfunction.  Patient's diabetes was controlled with  insulin.  He was started on IV vancomycin and Zosyn for sepsis.  The  patient was noticed to have respiratory failure, renal failure,  neurologic failure.  He was  transferred out of the ICU on October 15, 2007.  He was started on Neurontin and thiamine.  Noticed to have left knee  arthropathy and has been getting physical therapy to assist him with  ambulation.  Kidney failure seems to be improving.  Nephrologist is  going to follow him up as an outpatient on November 16, 2007.  The  patient  was educated on  need to comply with his diabetic medication,  to avoid NSAIDS  , to follow up with his primary  care and nephrologist.  Patient has been started on Epogen treatment.  Discharge condition is stable.  Diet will be renal1800  calorie ADA.    Activity:  Increase slowly as tolerated.  He is going to be followed up with home PT for ambulation assistance.  Follow up with the [facility] outpatient  clinic in a week.  Follow up with  Dr. Marval Regal renal]  on November 16, 2007.   MEDICATIONS ON DISCHARGE:  1. Lopressor 50 mg b.i.d..  2. Lantus insulin 50 units subcu at bedtime.  3. Glucotrol 2.5 mg daily.  4. prilosec 20 mg  daily.  5. Neurontin 300 mg at bedtime.  6. Thiamine 100 mg daily.  7. Aranesp 200 mcg weekly.  8. PhosLo two tablets t.i.d.  9. Accu-Chek a.c., at bedtime, with sliding scale insulin NovoLog      __________  scale coverage.  10.Lasix 20 mg p.o. daily.  11.KCl 10 mEq p.o. daily.  12.percocet  2.5/325 one to two q.6 h. p.r.n.   PHYSICAL EXAMINATION:  GENERAL:  He is a middle-aged man, obese, not in  acute distress.  VITAL SIGNS:  Temperature 98, pulse 79, respiratory rate 18, blood  pressure 130/80.  HEENT:  Clear clinically, oropharynx  clear.  Head is atraumatic,  normocephalic.  Mucous membranes are moist.  CHEST:  Globally clear.  HEART:  Sounds 1 and 2 regular rate and rhythm.  ABDOMEN:  Benign.  NEURO:  He is alert, oriented in time, place, and person.  Cranial  nerves II through XII intact.  Mild weakness of the left leg, with mild  reduced range of motion left knee.  EXTREMITIES:  Peripheral pulse present; no pedal edema.   The available labs show that the parathyroid hormone 8.8, vitamin B  level is low at 11.  Sodium is 141, potassium 3.4, chloride 104,  bicarbonate 31, glucose 127, BUN 70, creatinine 3.7.  WBC 7, hematocrit  27, platelet count 475.   Discharge planning 30 minutes.      Durwin Nora, MD  Electronically Signed     MIO/MEDQ  D:  10/20/2007  T:  10/20/2007  Job:  [id number]

## 2011-02-19 NOTE — Consult Note (Signed)
Jeffrey [name], Jeffrey [name]              ACCOUNT NO.:  [id number]   MEDICAL RECORD NO.:  [id number]          PATIENT TYPE:  INP   LOCATION:  [id number]                         FACILITY:  [facility]   PHYSICIAN:  Rigoberto Noel, MD      DATE OF BIRTH:  August 27, 1966   DATE OF CONSULTATION:  10/09/2007  DATE OF DISCHARGE:                                 CONSULTATION   REASON FOR CONSULTATION:  Hypertension, diabetic ketoacidosis.   HISTORY OF PRESENT ILLNESS:  This patient is a 45 year old African  American gentleman who is currently confused.  The history is obtained  after speaking to the medical resident and reviewing his ER record.  Apparently, he is an alcoholic with many drinks per day, but his last  drink was more than 24 hours ago (this is as per his wife, but she  herself is not sure of this fact).  He was found to be increasingly  confused this morning, and after his wife could not obtain a capillary  blood glucose, she brought him to the emergency room.  He was found to  be acidotic with a pH of 7.11 and a glucose greater than 700.  The anion  gap was 25.  He was also in acute renal failure with a creatinine of  4.3.   Since then, he has received 2 liters of normal saline, being started on  an insulin drip, but has not had any urine output.  He has been  increasingly confused.   PAST MEDICAL HISTORY:  1. History of recurrent pancreatitis, last episode in 2006/01/12.  2. Type 2 diabetes.  Unclear if he has had a history of ketoacidosis      in the past or not.  3. Chronic alcoholism with a history of DTs.  4. Hyperlipidemia.  5. Hypertension.   PAST SURGICAL HISTORY:  None.   MEDICATION ALLERGIES:  None.   HOME MEDICATIONS:  1. Metoprolol 100 mg b.i.d.  2. Hyzaar 100/25 one mg p.o. daily.  3. Metformin 1000 mg p.o. b.i.d.   SOCIAL HISTORY:  He apparently is a heavy alcohol user.  Nonsmoker but  chews tobacco.  One of his sons died in 01/12/06, and this provoked an  intense grief reaction.   FAMILY HISTORY:  Unclear at the time of dictation.   REVIEW OF SYSTEMS:  Denies pain, dyspnea, hematemesis, or melena.   PHYSICAL EXAMINATION:  GENERAL:  Confused gentleman, appears his stated  age.  In moderate respiratory distress.  VITAL SIGNS:  Oxygen saturation 93% on 2 liters nasal cannula.  Heart  rate 96 per minute, sinus on monitor, respirations 28 per minute, blood  pressure 110/72.  HEENT:  Dry mucosa.  NECK:  Supple.  No JVD.  No lymphadenopathy.  CARDIOVASCULAR:  S1 and S2 normal.  No S3.  No murmur.  Ejection  systolic murmur 2/6 at the base.  ABDOMEN:  Soft, distended, nontender.  LUNGS:  Bilateral air entry.  No crackles.  No rhonchi.  EXTREMITIES:  Cool.  No edema.  CNS:  No meningeal signs.  Pupils 3 mm and reactive to light.  Responds  appropriately to 1-step commands; however, disoriented to time, place,  and person.   LABORATORY DATA:  Sodium 121, potassium 5.3, chloride less than 30,  bicarbonate 12, glucose 1591.  BUN and creatinine 74 and 6.15.  Bili  2.3, SGOT and SGPT 16 and 12, albumin 3.3.  Alcohol level less than 5.  Lactate 7.3.  Arterial blood gas 7.25, 25, 124, 98% on 3 liters nasal  cannula.  Urine drug screen was negative.  Troponin was less than 0.05.  UA showed glucose more than 1000 and ketones 15, nitrites and leukocyte  esterase were negative, 0-2 white cells.   IMPRESSION:  1. Diabetic ketoacidosis.  2. Lactic acidosis.  3. Acute renal failure.  4. Altered mental status.   ASSESSMENT:  The precipitating factor for diabetic ketoacidosis is  unclear to me at this time.  He does not seem to have any obvious  infection, either in his urine or pneumonia.  Serum lipase is pending.   The lactic acidosis can be attributed to the low-flow state and  hypertension.   The reason for the altered mental status could be the hyperosmolar  state; however, acute CVA will need to be ruled out.  I doubt meningitis  in the absence of neck signs.   The  acute renal failure could be a combination of low-flow state and  angiotensin receptor blocking.   RECOMMENDATIONS:  1. Management of DKA will primarily be deferred to the teaching      service.  2. He has been placed under diabetic ketoacidosis protocol with      probably fluids, insulin drip, and replacement of potassium.  I      would use CVP to guide therapy and aim for a CVP of 10.  3. His sodium does seem to correct to normal values for the      hyperglycemia.  Hence, I would use half normal saline after the      initial boluses to avoid hyperchloremic acidosis developing.  4. Head CT with contrast will be obtained.  5. Central venous access will be obtained under sterile precautions      and local anesthesia after informed consent.  A triple-lumen      catheter was placed in the right internal jugular vein.  Venous      blood was drawn through all 3 ports.  Chest x-ray is pending at the      time of dictation.  6. Serial blood gases can be followed once the anion gap normalizes.      The insulin drip can be tapered off once the CBGs come to less than      250.  Dextrose can be added to IV fluids.  7. IV Protonix will be used for GI prophylaxis and subcu Lovenox for      DVT prophylaxis.  8. For agitation, IV Ativan can be used for presumptive alcohol      withdrawal.   Thank you, Dr. Jimmye Norman, for involving [name] in the care of this patient.  Will assist with his management during his hospital stay.      Rigoberto Noel, MD  Electronically Signed     RVA/MEDQ  D:  10/09/2007  T:  10/09/2007  Job:  [id number]

## 2011-02-19 NOTE — Consult Note (Signed)
NAMEJAMORIAN, HATTER              ACCOUNT NO.:  [id number]   MEDICAL RECORD NO.:  [id number]          PATIENT TYPE:  INP   LOCATION:  [place]                         FACILITY:  [facility]   PHYSICIAN:  James L. Deterding, M.D.DATE OF BIRTH:  19-Mar-1966   DATE OF CONSULTATION:  10/19/2007  DATE OF DISCHARGE:                                 CONSULTATION   REASON FOR CONSULTATION:  Severe renal insufficiency.   HISTORY OF PRESENT ILLNESS:  This is a 45 year old gentleman who was  admitted on October 09, 2007 with acidosis, __________ , and ketoacidosis  with a surgery that was very high, including hyperosmolar. He had some  hyponatremia and hyperkalemia at the time that resolved with treatment.  He has had a recent alcohol binge and chronic alcoholism. He has had  hypertension over 20 years, diabetes for 7 or 8 years, history of  pancreatitis and hospitalized x3, last being in 2007. History of  obesity. When he was admitted, his creatinine was 6 on admission. It  varied 2.5 to 6 during his hospitalization and currently is running  about 4. There has been variable hydration status and blood pressure.  His creatinine was 1.2 in January 2007 but during that hospital  admission, it was highest at 2.9. He is said to have ace intolerance.  Has been on ARB, i.e. Cozaar, question of that on admission. He has also  been on Metformin at home. He was hypotensive for several days,  requiring pressors and large volumes of IV fluid. Clearly, he was volume  depleted but also had gram positive rods in his blood.   A grandmother was on dialysis. His mother died of a stroke in her 80's.  She had severe hypertension. His father is in his 66's and has  hypertension. He has 2 sisters with hypertension. He denies history of  stones, UTI, hematuria, edema, PND, or orthopnea.   REVIEW OF SYSTEMS:  HEENT:  He says his vision is still blurring since  this episode. No sores in his eyes, dry eyes, dry mouth, history  of  rheumatic fever or Scarlet fever. No hearing deficits. PULMONARY:  He is  a non-smoker. He denies cough. GASTROINTESTINAL:  He has had  indigestion, heartburn. No constipation, diarrhea. No history of  hepatitis or yellow jaundice. CARDIOVASCULAR:  Sleeps on 1 pillow.  Nocturia variably zero to 3. SKIN:  He says he gets real dry skin when  it is hot out. He gets rashes. MUSCULOSKELETAL:  He injured his knee and  he has had an arthroscopy in his left knee. NEUROLOGIC:  Negative. He  has been notably confused for several days early in this admission and  is said to be coming closer to normal at the current time.   PAST MEDICAL HISTORY:  Include arthroscopies of the hands. Questionable  allergy to Protonix and a cough with ace inhibitor. He has been  hospitalized for pancreatitis.   MEDICATIONS:  Metformin, __________ , Hyzaar 100/25 once daily,  Metoprolol 100 mg b.i.d.   SOCIAL HISTORY:  He denies street drugs but has history of alcohol  abuse. He is unemployed at  the current time. He does not have any  children of his own. He has 4 step-children, one who passed away.   FAMILY HISTORY:  As listed above.   CURRENT MEDICATIONS:  Include Clonidine 0.1 q.8 hours, Pepcid 20 mg  q.h.s., Metoprolol 50 mg b.i.d., insulin __________ 10 units at bedtime,  Diazepam 5 mg b.i.d., Gabapentin 300 mg daily, Thiamine 100 mg daily,  furosemide 40 mg IV every day, KCL 10 meq daily, NovoLog sliding scale  __________ 60 mcg q.7 days, calcium acetate 1334 mg t.i.d., Haldol 2 mg  p.r.n., Aldomet 5 mg p.r.n. Tylenol p.r.n.   PHYSICAL EXAMINATION:  GENERAL:  He has very poor memory, wondering  conversation.  VITAL SIGNS:  Blood pressure 106/68, one was recorded at 130/108.  Glucose is 72 to 247 today. He is 100% O2 sat on room air.  HEENT:  Funduscopic is not well visualized but no gross lesions are  noted. Pharynx unremarkable.  NECK:  Without masses or thyromegaly.  LUNGS:  Decreased breath sounds  but no rales, rhonchi, or wheezes.  Normal percussion on expansion.  CARDIOVASCULAR:  Regular rate and rhythm. 2/6 holosystolic murmur at the  apex. PMI is 11 __________ around the 5th intercostal space. No heaves,  thrills and no bruits noted.  EXTREMITIES:  He has trace edema. Pulses are 2+/4+.  ABDOMEN:  Abdomen is obese. Positive bowel sounds. Liver is down 3 to 4  cm. Soft.  SKIN:  Old papillary lesions and evidence of abrasions. No active rash.  No significant adenopathy, __________ , or supraclavicular, or cervical  adenopathy.  NEUROLOGIC:  Cranial nerves 2-12 are grossly intact. Motor was 5/5 and  symmetric. Deep tendon reflexes are 2+/4+ in upper and lower  extremities. Toes are downgoing.  MENTAL STATUS:  He has wondering mental status. He is oriented x3.   LABORATORY DATA:  Ultrasound, right 12.8 cm and left 12.7 cm kidneys.   Creatinine clearance 28 cc noted on 24 hour urine and __________ mg on  24 hour urine. Sodium 139, potassium 3.4, chloride 105, bicarb 20,  creatinine 4, BUN 19, glucose 168, hemoglobin 9.1, white count 7.8,  platelets 478,000.   ASSESSMENT:  Renal impairment. CKD versus acute kidney injury. Suspect  there is some acute kidney injury component. May get better, though is  related to __________ related hemodynamic causes, i.e. sepsis and  volume. How much of that is chronic kidney disease is not clear at this  point. He has had recurrent episodes of acute kidney injury, so  reversibility is not total at this point. Suspecting he has significant  chronic kidney disease. Whether this is diabetes, diabetic, or  hypertensive, is not clear.   He is anemic and hypertension is a complications at the current time.  Need to check a PTH. Course will be defined by a time in which  creatinine comes down to. Goals at this time are diet, blood pressure  control, blood sugar control. Restart CRB in 2 to 3 weeks. May need  management and may need  hyperparathyroidism management.  1. Hypertension. Variable at this time. Will need to check his      orthostatic's. There is no cuff available at this time. Needs to be      less than 130/80. Use ARB in 2 to 3 weeks. No sure why he is      getting Lasix and IV fluids.  2. Diabetes mellitus. He needs tight control.  3. Anemia. Acute illness and renal in origin. Needs iron.  Sat's are      okay. Will put him on a higher dose of __________ .  4. Alcohol abuse. Pancreatitis in the past. __________ now as well as      Metformin abnormalities.  5. Obesity.  6. Hyperlipidemia.  7. Degenerative joint disease.  8. Gram positive rod sepsis per his primary service.   PLAN:  1. Discontinue his IV fluids.  2. P.o. Lasix.  3. Discontinue Clonidine. May be at high risk for withdrawal with his      adherence issues.  4. Supra-parathyroid __________ .  5. Urinalysis.  6. __________ .  7. __________ .  8. Avoid __________ as above. __________           ______________________________  Joyice Faster. Deterding, M.D.     JLD/MEDQ  D:  10/19/2007  T:  10/20/2007  Job:  [id number]   cc:   [facility]

## 2011-02-19 NOTE — Discharge Summary (Signed)
Jeffrey, [name] NO.:  [id number]   MEDICAL RECORD NO.:  [id number]          PATIENT TYPE:  INP   LOCATION:  [id number]                         FACILITY:  [facility]   PHYSICIAN:  Dickie La, MD        DATE OF BIRTH:  [birth year]   DATE OF ADMISSION:  05/10/2009  DATE OF DISCHARGE:  05/12/2009                               DISCHARGE SUMMARY   PRIMARY CARE Khalidah Herbold:  Jeffrey Arthur, MD, at [facility].   CONSULTATIONS:  None.   PROCEDURES:  A portable chest x-ray on May 11, 2009, showed no acute  disease.  Lungs are clear.  Heart size normal.   LABORATORY DATA:  CBC on admission showed WBC 14.9, hemoglobin 15.1,  hematocrit 46, platelets 287.  Cardiac enzymes:  CK-MB 1.1, troponin I  less than 0.5, myoglobin 203.  CMP on admission showed sodium 135,  potassium 4.8, chloride 93, CO2 of 13, BUN 17, creatinine 2.05, glucose  427, calcium 9.6, total bilirubin 0.8, alk phos 122, AST 38, ALT 28,  total protein 8.3, albumin 4.2, lipase 11, anion gap of 30.  Blood,  ketones negative.  UA showed urine yellow, appearance clear, specific  gravity of 1.021, pH 5.5, glucose greater than 1000, ketones 40, blood  small, protein 100, negative nitrite, negative leukocytes, negative  bilirubin.  Urine microscopy showed rare squamous epithelial cells, 2-6  white blood cells, 2-6 rbc's, rare bacteria.  Lipid profile showed  cholesterol 246, HDL 48, LDL 139, triglycerides 294.  Cardiac enzymes  were negative x3 throughout, for the patient's admission.  The patient's  sodium began to drop per second BMP, but then corrected easily.  The  patient's anion gap corrected the morning after admission.  The  patient's HbA1c was 10.4.  A followup CBC the second hospital day showed  WBC 9.1, hemoglobin 14.3, hematocrit 42.8, platelets 216.  Followup CMP  second hospital day showed anion gap had closed.   BRIEF HOSPITAL COURSE:  This patient is a 45 year old gentleman  who  presented to the emergency department, in diabetic ketoacidosis:  1. Diabetic ketoacidosis.  The patient received 2 normal saline      boluses in the ED and was placed on normal saline 250 mL per hour.      He was given 10 mEq of KCl per IV, as his potassium was not as high      as one would expect given his DKA; although, sodium was also not as      low as one would expect.  He was given a total of 3 pulse liters      steroid.  The patient was started on the glucometer and was given      normal saline 250 mL per hour until CBG is less than 250, and then,      it was switched to IV fluids D5 half normal saline at 150 mL per      hour.  The patient was transitioned to subcutaneous insulin when      the CBG was less than 175 for x4 hours.  Began with Lantus half      normal dose and moderate SSI.  When the patient stopped his IV      insulin, his fluids were written to be changed back to normal      saline and start a modified carb diet.  However, the morning after      the patient was admitted, his blood glucose remained high at 350s.      The patient, even though his blood glucose had dropped to 160 the      night after being admitted, his D5 half normal saline had been      continued through the second night, resulting in the high blood      glucose readings next morning.  The patient was given half a dose      of Lantus in the afternoon of the second day in the hospital and      the plan was to watch his sugars overnight.  As his blood sugars      crept up so high the following day, he was given his regular dose      of Lantus in the morning, 70 units.  His anion gap closed the night      after he was admitted.  As for causes of his diabetic ketoacidosis,      cardiac enzymes were negative x3, chest x-ray was normal, the      patient had no signs or symptoms of infection, and his white blood      cells were 9.3 on the second hospital day.  The patient states that      episode where  he passed out at work was a wake up call to him      where he wants to take better care of his health now.  I discussed      at length with the patient's the need to strictly monitor his blood      glucose levels.  The patient endorses history of difficulty      regularly taking Lantus 70 units roughly 24 hours apart from each      other.  Because of this, it was decided to switch his Lantus to 35      units 12 hours apart.  So discussed with the patient the      possibility that he might need meal time insulins and that this      will be discussed in further outpatient visits.  We continued to      monitor the patient's CBGs throughout second hospital day and they      continued to decrease.  2. Hypertension.  The patient's blood pressures were running 0000000      systolic while in-house.  Because of this, the plan was to add      second-line drug to his therapy.  Due to his diabetic status, one      would consider an ACE inhibitor; the patient has history of cough      when on ACE inhibitors in the past.  A decision was made to try      losartan 50 mg daily, along with continue his regular dose of      Toprol at 100 mg b.i.d.  3. Hypercholesterolemia.  The patient's fasting lipid panel was high      across the board.  His LDL was 139 when his goal is below 70, as he      is diabetic.  The patient is  therefore started on simvastatin 40 mg      a day for cardiac protection now.  The patient will need to be      followed up for high lipids on an outpatient basis.  4. Murmur.  When the patient first came into the emergency department,      he was noted to have a grade 2/6 systolic ejection murmur heard      best along both right and left sternal borders.  Going back to his      clinic notes and past hospital admissions, the murmur had not been      auscultated previously.  I briefly considered cardiologist's      referral with or without possible echo.  However, the second      morning  of admission, once the patient had been well hydrated, the      murmur was barely audible, which is likely the reason that it has      not been auscultated in the past as it is faint.  Most likely, due      to the patient's relatively young age, quality and location of the      murmur, it is most likely benign.  We will need to follow up on an      outpatient basis.  As the patient came in rather dehydrated and his      body was stressed, it is possible that the murmur improved in      intensity due to increased cardiac output.  5. Syncope.  The patient experienced syncopal episode while at work,      which has what lead him eventually coming into the emergency      department.  Cardiac enzymes were negative x3.  EKG is negative x2.      I think it is most likely related secondary to his diabetic      ketoacidosis.  The patient has no history of seizure disorders or      any seizures in the past.  The patient also states his blood sugars      have been running 400-600 the week prior to his syncopal episode.   MEDICATIONS:  1. Toprol 100 mg p.o. b.i.d.  2. Aspirin 81 mg.  3. Pepcid 20 mg over the counter.  4. Simvastatin 40 mg daily.  5. Losartan 50 mg daily.   PATIENT'S INSTRUCTIONS:  The patient was instructed to return to the  emergency department or call his PCP if he has any chest pain, shortness  of breath, lightheadedness, episodes of  losing consciousness, altered  mental status, extremely high blood sugars not controlled with  medications, or any other concerns.   FOLLOWUP:  The patient has a followup appointment with Dr. Esmeralda [name], [facility], next Wednesday, at 3:30 in the  afternoon.      Annabell Sabal, MD  Electronically Signed      Dickie La, MD  Electronically Signed    JW/MEDQ  D:  05/12/2009  T:  05/13/2009  Job:  [id number]

## 2011-02-19 NOTE — H&P (Signed)
Jeffrey [name], Jeffrey [name]              ACCOUNT NO.:  [id number]   MEDICAL RECORD NO.:  [id number]          PATIENT TYPE:  EMS   LOCATION:  [facility]                         FACILITY:  [facility]   PHYSICIAN:  Domingo Cocking. Jimmye Norman, M.D.DATE OF BIRTH:  [birth year]   DATE OF ADMISSION:  10/09/2007  DATE OF DISCHARGE:                              HISTORY & PHYSICAL   CHIEF COMPLAINT:  Brought in by patient's wife for confusion.   HISTORY OF PRESENT ILLNESS:  The patient is a 45 year old African  American married alcoholic with a history of three prior episodes of  pancreatitis (most recently 2007), who was brought into the emergency  department this afternoon after his wife noticed him to be progressively  confused since awakening this morning.  He has not been eating well, but  has been drinking large quantities of fluid including cranberry juice.  He is an alcoholic with many drinks per day,  but has not had an  alcoholic beverage in the last 24 hours, although she is unable to  establish the accuracy of his report.  He has not complained of symptoms  of illness, specifically, no abdominal pain, nausea, vomiting, or  diarrhea.  There have been some mild cold symptoms, but nothing she  feels is remarkable.  He has been taking medications as prescribed, and  she does bring the medications for review today.  She attempted to  obtain a CBG today but not enough blood was present and she recognized  his illness would require further evaluation and brought him to the ED.   PAST MEDICAL HISTORY:  1. Type 2 diabetes mellitus (although with a history of DKA, question      whether or not recurrent pancreatitis has decreased endocrine      function).  2. Chronic alcoholism with history of DTs.  3. History of hypertension.  4. History of hyperlipidemia.  5. History of pancreatitis for which he was admitted 2001, 2003, and      2007.   MEDICATIONS:  Metformin 1000 mg p.o. b.i.d., Hyzaar 100/25 one p.o.  daily,  Metoprolol 100 mg p.o. b.i.d.   ALLERGIES:  None.   INTOLERANCES:  ACE (cough).   FAMILY HISTORY:  Mother deceased at age 31 secondary to cerebral  hemorrhage complications and hypertension.  Health of father and sisters  was documented healthy in 2007, but I did not have a chance to ask his  wife is this is still the case.   SOCIAL HISTORY:  He is married with four stepchildren; one son died in  [id number], which elicited a period of intense grief and self-medication with  alcohol.  He chews tobacco but does not smoke cigarettes. Heavy alcohol  consumption comprises several alcoholic drinks per day including beer,  the exact quantity is unknown.   PHYSICAL EXAMINATION:  VITAL SIGNS:  Temperature 96.1, blood pressure  83/47, pulse 73, respirations 24-30, saturation 98% on room air.  GENERAL:  He is an overweight black male, initially lying comfortably,  but later becoming somewhat confused and agitated, requesting something  to eat.  On my assessment, he was primarily somnolent, starring without  significant acknowledgement of examiner.  HEENT:  Extraocular movements intact, pupils are reactive, conjunctivae  are not inflamed, and there is trace icterus.  His face is symmetric.  Lips and tongue are extremely dry and cracked.  Dentition is in  extremely poor decay with periodontitis and gum recession, evidence of  chew tobacco in buccal cavity and below tongue.  NECK:  Thick and short without JVD, full range of motion.  CHEST:  Clear to auscultation, respiratory rate somewhat increased but  non-labored.  HEART:  Regular rate and rhythm with no murmurs, gallops, and rubs.  ABDOMEN:  Obese, soft, nontender with hypoactive bowel sounds.  Specifically, there is no epigastric pain.  EXTREMITIES:  Lower extremities are symmetric, there is no edema.  Dorsalis pedes pulses are not palpable and the feet are cool.  Radial  pulses are thready bilaterally.  SKIN:  Warm over core, cool over  periphery.  There is no cyanosis.   LABORATORY DATA:  Sodium 109 corrected at 118, potassium 5.9, chloride  73, bicarb 11.7, BUN 66, creatinine 4.3, glucose greater than 700.  Hemoglobin 11.9, MCV 101, RDW 15.1, white count 15.5, ANC 13.3,  platelets 368.  Point of care enzymes CK MB 1.6, troponin 0.05.  Venous  blood gas pH 7.114, pCO 36.6.   ASSESSMENT/PLAN:  1. Anion gap metabolic acidosis consistent with DKA (although I have      not seen urine ketones) but with concern that Glucophage may be      contributing to some of the acidosis.  Underlying cause is not      clear; odontogenic infection is possible.  He will be treated with      IV fluids and insulin upon admission to intensive care unit.      Initial fluids will comprise normal saline x2 liters followed by      half normal saline with attention to too rapid correction of his      sodium.  Frequent BMPs will be assessed.  White blood cell count      will be reassessed after hydration.  Chest x-ray will be obtained      if respiratory symptoms suggest possible underlying pneumonia.      Given his respiratory exam, pulse ox, and absence of cough, I do      not feel compelled to obtain it at this time.  2. Hypotension with insufficient tachycardic response due to volume      depletion in the context of continued anti-hypertensive      medications.  Aside from volume repletion above, medications will      be held.  At this point, I do not feel sepsis is complicating the      picture.  3. Renal failure presumed secondary to volume depletion exacerbated by      Hyzaar.  Anti-hypertensives including Hyzaar as well as Glucophage      will be held.  BMPs will be obtained as hydration proceeds.  4. Alcoholism with risk of withdrawal.  Thiamine will be administered.      Pancreatitis will be screened for.  Ativan p.r.n. will be      administered.  5. Anemia with macrocytosis and anticipated further decline of      hemoglobin following  rehydration.  Empiric PPI, check RBC folic,      [id number], iron, TIBC, ferritin, and heme check stools.      Almyra Free A. Jimmye Norman, M.D.  Electronically Signed     JAW/MEDQ  D:  10/09/2007  T:  10/09/2007  Job:  [id number]   cc:   Richardine Service, M.D.

## 2011-02-22 NOTE — H&P (Signed)
[place]  Patient:    Jeffrey [name], Jeffrey [name] Visit Number: [id number] MRN: [id number]          Service Type: MED Location: [phone] Attending Physician:  Tiburcio Pea Dictated by:   Romero Liner, M.D. Admit Date:  12/22/2001                           History and Physical  PRIMARY MEDICAL DOCTOR:  None.  CHIEF COMPLAINT:  Stomach hurts/constipation.  HISTORY OF PRESENT ILLNESS:  A 45 year old African-American male in previous good state of health until Thursday morning when he developed abdominal tightness that felt "like I wanted to use the bathroom, but couldnt."  The patient was also unable to sleep.  He developed shortness of breath and felt as though his stomach was "swollen up."  The patient does not have insurance, so he did not want to come to the emergency department.  At that point, he began taking one tablespoon of red wine vinegar with water every morning in order to attempt to decrease his CBGs, which previously had been in 380-400 range.  This did lower his CBGs to the 200-210 range, as per patient. He also began taking an aspirin a day for pain.  The patient has a history of drinking alcohol approximately one-half gallon three to four times a week, but has not had alcohol since Thursday.  The patients pain worsened throughout the weekend and his wife made him come to the hospital today.  She said that he was not sleeping at night and the pain was much worse.  The patient has a history of pancreatitis in February of 2001 secondary to ETOH.  He stopped taking his diabetic medications in December of 2002 as his medications ran out and his primary M.D. would not restart.  The patient states that he was kicked out of the practice.  PAST MEDICAL HISTORY:  Significant for: 1. Pancreatitis in February of 2001 secondary to ETOH. 2. Diabetes mellitus, type 2, diagnosed with the pancreatitis in 2001.  The    patient  had been on Glucotrol XL 10 mg p.o. q.d. with moderately good    control until he ran out of medications. 3. Hypertension. 4. Anxiety. 5. Alcoholism with a history of DTs.  PAST SURGICAL HISTORY:  Significant for left knee surgery in November of 1999 and a root canal in 2001.  MEDICATIONS: 1. Paxil. 2. Metoprolol 100 mg p.o. b.i.d. 3. Glucotrol XL 10 mg p.o. q.d.  ALLERGIES:  None.  SOCIAL HISTORY:  The patient lives with his wife and four kids, ages 18-14. 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  Denies a tobacco history.  Positive ETOH three to four times a week consisting of liquor and he drinks 6-7 ounces at a time.  The patient stopped drinking after his last hospitalization and restarted approximately eight months ago.  The patient and his wife deny any history of drug use.  FAMILY HISTORY:  The patients mother died at age 17 secondary to CVA and diabetes.  His father is 38, alive and well.  The patient has three sisters, alive and well.  He has no other known family history of cancer, diabetes, or hypertension.  REVIEW OF SYSTEMS:  Positive for vision changes.  The patient complains of intermittent blurry vision, tachycardia, productive cough, and constipation. Negative for headache, dysphagia, chest pain, nausea, vomiting, bloody stools, dysuria, arthritis, or  arthralgia.  PHYSICAL EXAMINATION:  Temperature 98.1 degrees, pulse 112, respirations 22, saturating 98% on room air, blood pressure 190/88.  GENERAL APPEARANCE:  The patient is very somnolent, status post IV Dilaudid, but oriented and able to answer most questions appropriately.  HEENT:  Foul mouth odor.  Moderate dentition.  Oropharynx without erythema.  NECK:  Supple.  No lymphadenopathy.  Thyroid normal size and consistency.  CARDIOVASCULAR:  Hyperdynamic precordium with tachycardia and a 2/6 systolic murmur.  LUNGS:  Clear to auscultation bilaterally.  ABDOMEN:  Positive bowel  sounds throughout.  Tender to palpation, 1+, especially in the left upper quadrant with edema in the left upper quadrant.  EXTREMITIES:  Lower extremities with no clubbing, cyanosis, edema, or calf tenderness.  NEUROLOGIC:  Grossly intact.  Difficult to fully assess due to depressed mental status secondary to IV pain medications.  LABORATORY DATA:  WBC 14.8, hemoglobin 16.2, platelets 265.  The white count has 83% neutrophils.  Sodium 135, potassium 3.8, chloride 97, CH 89, BUN 15, creatinine 1.6, glucose 428, calcium 9.7.  Bilirubin 2.1, AST 24, ALT 62, alkaline phosphatase 112.  Lipase 616, amylase 168, LDH 170.  Cardiac enzymes x 1 negative.  Total protein 9.0, albumin 4.1.  ASSESSMENT:  A 45 year old African-American male with diabetes and questionable diabetic ketoacidosis and pancreatitis.  IMPRESSION AND PLAN: 1. Diabetes mellitus, type 2, and diabetic ketoacidosis.  The patient received    10 units of Regular Insulin in the ED.  Will restart Glucotrol XL 10 mg    p.o. q.d. when the patient is no longer NPO.  Will check beta-hydroxylase,    butyrate, and serum acetone to ascertain whether he is truly in DKA.  BMETs    q.4h. until the patient is stable.  Aggressive fluid hydration.  Sliding    scale insulin until the patient is no longer NPO. 2. Pancreatitis likely secondary to ethanol, relatively mild.  Will keep NPO.    Check amylase.  Abdominal CT not indicated at this time.  IV Dilaudid PCA    for pain control.  Ultrasound of liver in January of 2001 and hepatitis    serologies in December of 2000 were negative.  There is no indication for    antibiotics at this time. 3. Hypertension.  The patient was previously on metoprolol with decent    control, but he ran out of his medications.  Will restart metoprolol as he    has no apparent contraindications.  As the patient is still NPO, will cover    with IV labetalol for now. 4. Chronic alcoholism.  The patient has a history of  DTs.  Will start on    multivitamins, thiamine, and folate and start a Librium taper. 5. Social work consult requested secondary to ETOH issues.  6. Other.  The patient will follow up with [facility] with    Romero Liner, M.D., as he has no primary M.D.  Will check a    fasting lipid panel and hemoglobin A1C during this hospitalization and will    resume other diabetic prevention strategies as an outpatient. Dictated by:   Romero Liner, M.D. Attending Physician:  Tiburcio Pea DD:  12/23/01 TD:  12/23/01 Job: [id number]

## 2011-02-22 NOTE — Discharge Summary (Signed)
[place]  Patient:    Jeffrey [name], Jeffrey [name] Visit Number: [id number] MRN: [id number]          Service Type: MED Location: [phone] Attending Physician:  Tiburcio Pea Dictated by:   Karmen Bongo, M.D. Admit Date:  12/22/2001 Discharge Date: 12/25/2001                             Discharge Summary  DISCHARGE DIAGNOSES: 1. Diabetes mellitus, type 2. 2. Diabetic ketoacidosis, resolved. 3. Pancreatitis, improved. 4. Hypertension. 5. Hypercholesterolemia. 6. Alcoholism.  DISCHARGE MEDICATIONS: 1. Pepcid AC 10 mg p.o. q.d. (over-the-counter). 2. Metoprolol 100 mg p.o. b.i.d. 3. Zocor 20 mg p.o. q.d. Social work to provide one month supply.  Will try    to get samples/medical assistance for patient as an outpatient. 4. Avandia at 500/2 mg 1 tablet p.o. b.i.d. (Social work to provide one    month supply.  Will try to get samples/medical assistance as an    outpatient).  FOLLOWUP: 1. The patient is to see Dr. Karmen Bongo at [facility] on Tuesday, April 1, at    9:45 a.m. 2. [facility].  Will call the patient with    appointment.  PROCEDURES/DIAGNOSTIC STUDIES: 1. Chest x-ray on December 22, 2001, shows no active disease. 2. An EKG on March 18, shows sinus tachycardia, biatrial enlargement,    LVH with early repolarization.  CONSULTS:  Social work and [employer], [employer]gallery.  HISTORY AND PHYSICAL:  Please see admission H&P for further details.  In short, this is a 45 year old [place] American male who presented with a five-day history of worsening pancreatitis and diabetic control, causing him to go into DKA.  HOSPITAL COURSE: #1 - DIABETES:  The patient was given aggressive fluid hydration and subcutaneous insulin and eventually began to resolve his ketoacidosis and had improved glycemic control.  The patient continued on sliding scale insulin throughout hospitalization, based on input from the  diabetic coordinator, was started on Avandia at 500/2 mg b.i.d. and will continue on this as an outpatient.  He has had very poor diabetes control as reflected by his hemoglobin A1c of 11.3.  He ran out of his diabetes medications and did not have a primary medical doctor, and was unable to get more.  #2 - PANCREATITIS:  The patient has a history of pancreatitis in February 2001, secondary to ETOH.  He had been drinking approximately half a pint of liquor 3-4 times a week prior to admission, and did come in with elevated LFTs, slightly.  His pancreatitis improved quickly and the patient was advanced to clears and to solids foods prior to discharge.  He did complain of slight nausea and abdominal pain with solid foods, but has been able to tolerate clears without difficulty.  We will discharge to home with clear liquids and advanced to regular diet as tolerated.  #3 - HYPERTENSION:  The patient previously was on metoprolol 100 mg p.o. b.i.d. with relatively good control, but ran out in December.  He was restarted on metoprolol during this hospitalization.  #4 - HYPERCHOLESTEROLEMIA:  The patients total cholesterol is 285, LDL 190, started on Zocor 20 mg p.o. q.d.  Will need a followup cholesterol panel in approximately three months.  #5 - ALCOHOLISM:  The patient has a history of alcoholism in the past, but was able to stop drinking on his own.  Would like to try that again.  Social  work has left for patient a list of outpatient therapy resources.  Hopefully, the patient will make use of those.  FOLLOWUP:  The patient is to follow up at [facility], with myself.  I will attempt to help the patient maintain control over his lipids, hypertension, diabetes, and substance abuse.  DISCHARGE LABORATORIES:  CBC, on March 19, WBCs 15.2, hemoglobin 16.1, platelets 274.  BMET on March 21, sodium 142, potassium 3.7, chloride 107, CO2 19, BUN 9, creatinine 1.2, and glucose 187.   Bilirubin 2.3, AST 51, ALT 25, alkaline phosphatase 106, lipase 173, amylase 162, LDH 189.  Cardiac enzymes x1, negative.  Calcium 8.6, magnesium 2.9, phosphate 4.3.  Cholesterol 285, triglycerides 143, HDL 66, LDL 190.  Urine drug screen negative.  Hemoglobin A1c 11.3, alcohol level less than 5, blood acetone level on March 18, moderate.  DISPOSITION:  The patient was discharged to home without further incident. Dictated by:   Karmen Bongo, M.D. Attending Physician:  Tiburcio Pea DD:  12/25/01 TD:  12/28/01 Job: [id number]

## 2011-02-22 NOTE — Discharge Summary (Signed)
Jeffrey [name], Jeffrey [name]              ACCOUNT NO.:  [id number]   MEDICAL RECORD NO.:  [id number]          PATIENT TYPE:  INP   LOCATION:  3707                         FACILITY:  [facility]   PHYSICIAN:  Clifton Custard, M.D.      DATE OF BIRTH:  03/05/1966   DATE OF ADMISSION:  10/19/2005  DATE OF DISCHARGE:  10/24/2005                                 DISCHARGE SUMMARY   PRIMARY CARE PHYSICIAN:  Dr. Priscille Kluver at the [facility].   CONSULTATIONS:  Social work.   DISCHARGE DIAGNOSES:  1.  Acute pancreatitis.  2.  Alcoholism.  3.  Hyperlipidemia.  4.  Diabetes mellitus type 2.  5.  Hypertension.  6.  Anemia.  7.  Acute renal failure.   DISCHARGE MEDICATIONS:  1.  Glucophage 1000 mg by mouth 2x daily.  2.  Aspirin 81 mg by mouth once daily.  3.  Glucotrol XL 10 mg by mouth once daily.  4.  Hyzaar 25/100 mg by mouth once daily.  5.  Lantus 70 units before bed.  6.  Lopressor 100 mg by mouth 2x daily.  7.  BuSpar 10 mg by mouth once daily.  8.  Ativan 1 mg by mouth q.4 h. p.r.n. withdrawal symptoms.  9.  Pepcid 20 mg by mouth 2x daily.  10. Note:  Zocor was discontinued.   ADMISSION LABORATORY DATA:  White blood cell count 11.2, hemoglobin 12.1,  hematocrit 35.4, platelets 367.  Sodium 123, potassium 3.1, chloride 75,  bicarb 27, BUN 39, creatinine 2.9, lipase elevated at 396, LDH was 238, LFTs  were within normal limits. Alcohol level 151.   PROCEDURES:  Abdominal CT showed acute pancreatitis with no pseudocyst or  pancreatic abscess.   IMPORTANT LABS DURING HOSPITAL STAY:  Hemoglobin A1c 7.1.  Lipid panel:  Total cholesterol 87, triglycerides 56, HDL 39, LDL 37, folate 533, vitamin  B12 of 1192, creatinine at discharge 1.2.  Hemoglobin and hematocrit at  discharge 9.5 and 27.9 respectively.  Stool was heme-negative.   BRIEF HISTORY OF PRESENT ILLNESS:  The patient is a 45 year old male with  past medical history of acute pancreatitis secondary to alcohol  abuse x2 in  the years of 2001 and 2003 who presented with 10/10 left upper quadrant and  mid epigastric pain with vomiting x24 hours.  The patient had been drinking  5-6 drinks per day for the last several months including the day of  admission.  The patient was found, in the emergency department to have an  elevated lipase of 396 and an abdominal CT showing acute pancreatitis.  The  patient also had acute renal failure with LA creatinine of 2.9 as well as  hypokalemia with a potassium of 3.1, hyponatremia with a sodium of 123.  The  patient also had an elevated blood alcohol level of 151.   HOSPITAL COURSE BY PROBLEMS:  Problem #1:  ACUTE PANCREATITIS.  The patient  was admitted and then aggressively rehydrated with IV fluids.  The patient  was made NPO for bowel rest.  His pain was controlled with Dilaudid 2 mg  IV  q.3-4 h. The patient was also given Pepcid 20 mg IV b.i.d. for GI  prophylaxis.  His diet was advanced slowly, and at the time of discharge the  patient was taking a regular diet without any pain, nausea or vomiting.   Problem #2:  ALCOHOL ABUSE.  The patient was started on the Ativan protocol.  He was given multivitamins and thiamine.  Social worker met with the patient  and has arranged followup for the patient at [facility] on October 30, 2005 at 8:30  in the morning as well as providing him with a list of AA meetings in the  [place] area.  The patient was discharged with a prescription for Ativan  p.r.n. for withdrawal symptoms over the next day.   Problem #3:  HYPERTENSION.  The patient's blood pressure medications were  withheld throughout his hospital course, but were stable.  Prior to  discharge the patient was restarted on his home medications of Lopressor 100  mg p.o. b.i.d. and Hyzaar.   Problem #4:  HYPERLIPIDEMIA.  Cholesterol panel showed an LDL of 39 with 56  triglycerides and a total cholesterol of 87.  As a result the patient's  Zocor was discontinued.  This  is possibly due to decreased oral intake due  to heavy drinking over the past several months.  Would recommend following  the patient's cholesterol in case his cholesterol rises as his diet  improves.   Problem #5:  DIABETES MELLITUS.  The patient was made NPO and covered with a  sliding scale insulin. Of note, the patient's hemoglobin A1c, during  admission, was found to 7.1.  The patient was gradually restarted on his  home regimen of Lantus 70 units q.h.s. as well as Metformin 1000 mg p.o.  b.i.d. and Glucotrol XL 10 mg p.o. daily prior to discharge as his diet was  advanced.   Problem #6:  ANEMIA. The patient's hemoglobin and hematocrit was 12.1 and  35.4 at admission, but decreased to 9.5 and 27.9 at the time of discharge.  The patient was Hemoccult-negative on several occasions.  The patient's B12  and folate were within normal limits. His MCV was 96.  At the time of  discharge the patient was not orthostatic and did not require any blood  transfusions prior to discharge.  The patient does need to have his CBC  checked at followup appointment next week to make sure that this stable.   Problem #7:  ACUTE RENAL FAILURE.  The patient's creatinine was initially  2.9.  This was felt to be prerenal in etiology and his creatinine improved  daily with rehydration and was 1.2 at the time of discharge.   Problem #8:  FLUIDS AND ELECTROLYTES.  The patient's hyponatremia was felt  to be secondary to dehydration.  His sodium levels were corrected with  hydration.  The patient's potassium was low at 3.1 and this was replaced as  well.   Problem #9:  ANXIETY.  The patient was restarted on his home medication of  BuSpar prior to discharge.   FOLLOWUP APPOINTMENTS:  With Dr. Redmond Pulling, phone number [phone] on October 30, 2005 at 11:30 a.m.  The patient also has an appointment at [facility] on  October 30, 2005 at 8:30 a.m.  FOLLOWUP ISSUES:  The patient needs to have his CBC checked to make sure   that his anemia is stable; again, anemia is felt likely secondary to alcohol  abuse.      Clifton Custard, M.D.  MR/MEDQ  D:  10/24/2005  T:  10/24/2005  Job:  [id number]

## 2011-02-22 NOTE — H&P (Signed)
[place] [facility]  Patient:    Jeffrey [name]                      MRN: [id number] Adm. Date:  [id number] Attending:  Court Joy CC:         Orpah Melter, M.D., [facility]                         History and Physical  DATE OF BIRTH:  1965-10-27  PROBLEM LIST:  1. Acute alcoholic pancreatitis.  2. Uncontrolled hypertension.  3. Hypokalemia secondary to dehydration, diuretic agents, alcohol abuse, and     diarrhea.  4. Hyponatremia secondary to dehydration, diuretic agents, alcohol abuse, and     diarrhea.  5. Alcoholic hepatitis.     a. Ultrasound of the liver negative for cholelithiasis October 09, 1999.     b. Negative hepatitis serologies on September 20, 1999.  6. Chronic intermittent dyspepsia, probable gastritis, duodenitis, questionable     esophagitis associated with alcohol abuse.     a. Negative H. pylori antibodies (September 20, 1999).  7. Hyperglycemia, rule out diabetes mellitus.  8. Mild hypercalcemia, probably secondary to dehydration.  9. Leukocytosis, secondary to problem #1 and problem #5. 10. Dehydration. 11. History of anxiety. 12. History of chronic alcohol use. 13. Obesity. 14. The patient chews tobacco. 15. History of impotence.  CHIEF COMPLAINT:  Epigastric pain, nausea, vomiting.  HISTORY OF PRESENT ILLNESS:  Jeffrey [name] is a very pleasant 45 year old black man who presents with a four- to six-day history of worsening epigastric pain, nausea, vomiting, and diarrhea.  The patient describes a chronic intermittent epigastric pain history that was evaluated on September 20, 1999.  At that point, the lipase was within normal limits.  Because the SGOT was elevated at 150, hepatitis serologies were obtained.  No evidence of hepatitis in the past was noticed by his screening/testing.  The H. pylori antibodies were negative.  Mr. Caez admits drinking at least 8 ounces of liquor a day.   His alcohol intake increased since  March 2000, when the patients mother died from a intracerebral hemorrhage.  The  patient describes intermittent epigastric pain.  Diarrhea started about New Years Eve.  No evidence of melena, tarry stools, bright red blood per rectum.  No chest pain, no shortness of breath, no focal weakness, no postnasal drip, no rhinorrhea, no cough.  The patient describes decreased appetite.  The patient was throwing p liquids and solids for the last three days.  This vomiting took place about 20 minutes after ingesting.  His abdominal pain radiates to his back following a belt distribution.  He denies seizures, syncope, palpitations.  No blurred vision or  focal weakness.  No abdominal cramping.  PAST MEDICAL HISTORY:  As problem list.  ALLERGIES:  None.  CURRENT MEDICATIONS: 1. Paxil 40 mg p.o. q.d. 2. Propranolol 10 mg p.o. b.i.d. 3. Hydrochlorothiazide 25 mg p.o. q.d. 4. Buspar 15 mg p.o. b.i.d.  SOCIAL HISTORY:  The patient is married.  He has five children; ages 66, 65, 21, 72, and 64.  He works as a [employer] officer.  He chews tobacco, about 1/2 can every day. e drinks as described above.  His last drink was yesterday.  FAMILY HISTORY:  Significant for hypertension complicated by an intracerebral hemorrhage (mother, died at age 30); otherwise negative for diabetes, coronary artery disease, malignancy, or hyperlipidemia.  REVIEW OF SYSTEMS:  As  HPI.  The patient denies dysuria, hematuria.  He reports  about a 30-pound weight loss in the last eight months, though records from [facility] did not correlate this weight loss.  PHYSICAL EXAMINATION:  VITAL SIGNS:  Temperature 97.3, blood pressure 148/112, heart rate 96, respirations 20, oxygen saturation 99% on room air.  HEENT:  Normocephalic, atraumatic.  Slight icteric sclerae.  Conjunctivae within normal limits.  TMs within normal limits.  PERRLA; EOMI.  Funduscopic  exam negative for palpebral hemorrhages.  Dry mucous membranes.  Oropharynx clear.  NECK:  Supple, no JVD, no bruits, no adenopathy, no thyromegaly.  LUNGS:  Clear to auscultation bilaterally without crackles or wheezes.  Good air movement bilaterally.  CARDIAC:  Regular rate and rhythm without murmurs, rubs, or gallops.  Normal S1, S2.  ABDOMEN:  Obese.  Mild to moderate epigastric tenderness.  No rebound. Decreased bowel sounds.  No hepatosplenomegaly.  Slight right upper quadrant tenderness but no Murphys sign.  No abdominal bruits, no masses, no guarding.  RECTAL:  Deferred.  GU:  Within normal limits.  NEUROLOGIC:  Alert and oriented x 3.  The patient is slightly agitated, though noncombative.  The strength is 5/5 in all extremities.  The DTRs are 3/5 in all  extremities.  Sensory intact.  Cranial nerves II-XII intact.  Plantar reflexes downgoing bilaterally.  LABORATORY DATA:  Amylase 488, lipase 1260.  Hemoglobin 16.7, MCV 87, WBC 12.0,  platelets 262.  Sodium 125, potassium 2.6, chloride 75, CO2 36, BUN 8, creatinine 1.0, glucose 193, calcium 11.0, total protein 8.8, albumin 4.1, SGOT 212, SGPT 60, alkaline phosphatase 115, total bilirubin 1.9.  The liver ultrasound was negative for gallstones.  The common bile duct was hard to visualize due to the technique and body habitus, though no evidence of common bile duct dilation was noted.  ASSESSMENT AND PLAN: 1. Acute alcoholic pancreatitis--given the history of presentation, elevated pancreatic enzymes with concurrent alcohol abuse and negative liver ultrasound,  alcoholic pancreatitis is very likely.  The patient has been unable to keep solids or liquids or medications down for the last two to three days.  We will admit the patient to a regular bed for bowel rest, pain control, and supportive care.  IV  Pepcid will be given.  The pancreatic enzymes will be monitored in the morning.  2. Uncontrolled  hypertension--the patients elevated blood pressure is likely to be multifactorial.  Pain, the recurrent vomiting (unable to keep medications down),  and early DTs are the most likely diagnosis.  We will continue beta blocker for now and use clonidine as needed.  We will hold the hydrochlorothiazide, given the patients dehydration, hypokalemia, and acute pancreatitis.  Note that his last antihypertensive agent has been related with acute pancreatitis, though the incidence is low.  3. Hypokalemia--as described in the problem list, this low serum potassium is likely to be associated with dehydration, diuretic agents, alcohol use, and diarrhea.  For now, intravenous and p.o. potassium supplementation will be given to the patient.  The serum potassium will be rechecked in the morning.  We will add a magnesium level to the labs obtained in the emergency department.  4. Hyponatremia--this is likely to be associated with dehydration.  Normal saline intravenous will be given to the patient and the sodium level will be repeated n the morning.  5. Dehydration--the physical exam is consistent with dehydration.  We will start aggressive intervention hydration therapy.  The fluid volume status will be monitored and the orthostatics will  be monitored every morning.  6. Hyperglycemia--rule out diabetes mellitus--given the patients alcohol use for years, there is a risk of type 1 diabetes mellitus due to recurrent pancreatitis. At this point, the patients hyperglycemia may be related to the acute pancreatitis alone.  We will recheck a fasting CBG in the morning.  No need for sliding scale insulin is recommended at this point.  7. Chronic alcohol abuse--we will start on thymine, multivitamins.  Information  about AA meetings and rehab will be given to the patient before he is discharged. Given the high risk for alcohol withdrawal syndrome, we will go ahead and start the patient on  Librium.  The patients magnesium, phosphorous, serum potassium will e monitored during this hospital stay.  8. Mild hypercalcemia--this mild elevation of the patients serum calcium is likely to be related to dehydration.  A new calcium level will be rechecked in the morning, after hydrating Mr. Madore. DD:  10/09/99 TD:  10/09/99 Job: [id number]

## 2011-02-22 NOTE — H&P (Signed)
Jeffrey [name], Jeffrey [name]              ACCOUNT NO.:  [id number]   MEDICAL RECORD NO.:  [id number]           PATIENT TYPE:   LOCATION:                                 FACILITY:   PHYSICIAN:  Verner Chol, MD DATE OF BIRTH:  1966/03/21   DATE OF ADMISSION:  10/20/2005  DATE OF DISCHARGE:                                HISTORY & PHYSICAL   PRIMARY CARE PHYSICIAN:  Dr. Priscille Kluver   CHIEF COMPLAINT:  Abdominal pain.   HISTORY OF PRESENT ILLNESS:  Patient is a 45 year old male with past medical  history of acute pancreatitis secondary to alcohol abuse x2 in the years  2001 and 2003 who presents today with 10/10 left upper quadrant and mid  epigastric pain with vomiting x24 hours.  The patient had quit drinking two  to three years ago, but recently resumed drinking within the past year,  currently drinking five to six drinks per day including today.  The patient  had been having intermittent abdominal pain and vomiting over the past three  weeks but symptoms acutely worsened over the past day.  Patient denies any  fevers or diarrhea.  Patient has had a lot of anxiety lately as he was laid  off from his job in October 2005 and his son was killed in June of 2005.  In  the patient's own words this has led him to self-medicate.  Patient denies  any other drug use.  Patient does have a history of DTs.   PAST MEDICAL HISTORY:  1.  Hypertension.  2.  Hypercholesterolemia.  3.  Type 2 diabetes.  4.  Arthritis.  5.  Constipation.  6.  Anxiety.  7.  Pancreatitis in 2001 and 2003.  8.  Alcohol abuse.   MEDICATIONS:  1.  Aspirin 81 mg p.o. daily.  2.  BuSpar 100 mg p.o. daily.  3.  Glucophage 1000 mg p.o. b.i.d.  4.  Glucotrol XL 10 mg p.o. daily.  5.  Hyzaar 25/100 mg p.o. daily.  6.  Lantus 70 units q.h.s.  7.  Lopressor 100 mg p.o. b.i.d.  8.  Zocor 80 mg p.o. daily.   ALLERGIES:  PROTONIX gives the patient nose bleeds.  Patient is also  allergic to ACE INHIBITORS secondary to a  cough, although this is more  likely a side effect other than an allergy.   FAMILY HISTORY:  Mom died at age 43.  She had diabetes and died of a CVA.  Father is alive and well.  Patient has three sisters all of whom are  healthy.  Patient's children are all healthy.   SOCIAL HISTORY:  Patient lives with his wife and four children.  He does  have a history of alcohol abuse as described above, but denies any tobacco  or other drug use.  Patient is currently unemployed as he is laid off from  his last job in October 2005 and as mentioned above, son was killed in June  of 2005.   REVIEW OF SYSTEMS:  No fevers or chills.  No chest pain.  No shortness of  breath.  No  dysuria.  Patient still producing urine.  No skin rashes.  Remainder of review of systems is per HPI or unremarkable.   PHYSICAL EXAMINATION:  VITAL SIGNS:  Temperature 97.3, pulse 70, blood  pressure 124/72, respiratory rate 20, 99% on room air.  GENERAL APPEARANCE:  Patient is somewhat drowsy, but answers questions.  Patient is status post Dilaudid 2 mg.  Patient does have smell of alcohol on  his breath.  Patient is alert and oriented x3.  HEENT:  Head is normocephalic, atraumatic.  Pupils are equal, round, and  reactive to light and accommodation.  Extraocular movement is intact.  Sclerae are anicteric.  Tympanic membranes are clear bilaterally.  Patient  with dry lips.  Throat is without erythema or exudate.  LUNGS:  Clear to auscultation bilaterally.  HEART:  Regular rate and rhythm.  No murmurs.  ABDOMEN:  Hypoactive bowel sounds, soft, but very tender to palpation in mid  epigastric and left upper quadrant.  No hepatosplenomegaly.  No  periumbilical ecchymoses or flank ecchymoses.  EXTREMITIES:  No clubbing, cyanosis, edema.  Pulses 2+ dorsalis pedis and  radial pulses bilaterally.  RECTAL:  Good sphincter tone.  Prostate within normal limits.  Patient is  hemoccult-negative.  NEUROLOGIC:  Cranial nerves II-XII are  grossly intact.  Patient with 5/5  upper and lower extremity strength bilaterally symmetric.  Finger to nose is  intact.  SKIN:  No rashes.   LABORATORY DATA:  Lipase elevated at 396.  Sodium low at 123, potassium low  at 3.1, chloride low at 75, bicarbonate 27, BUN 39, creatinine elevated at  2.9, glucose 206, calcium 9.6, total protein 8.5, albumin 4.2, AST 35, ALT  34, alkaline phosphatase 64, total bilirubin 1.1, magnesium 1.4.  White  blood cell count 11.2, hemoglobin 12.1, hematocrit 35.4, platelets 367.  LDH  238.  Alcohol level elevated at 151.   IMAGING:  Abdominal CT showed acute pancreatitis, but no evidence of pseudo  cysts or pancreatic abscess and also showed high attenuation material in the  gallbladder consistent with sludge.   ASSESSMENT/PLAN:  A 45 year old male with alcohol abuse and acute  pancreatitis.  1.  Acute pancreatitis likely secondary to alcohol abuse.  Will make patient      n.p.o. except for medications in order to provide appropriate bowel      rest.  Patient will be aggressively rehydrated with normal saline.      Patient will be started on Pepcid intravenous given his history of      questionable Protonix allergy.  Patient will take morphine as needed for      pain control and Zofran as needed for nausea.  2.  Alcohol abuse.  Will start the Librium protocol as well as multivitamin,      thiamine, and folate.  We will monitor the patient on telemetry for at      least 24 hours in case patient does go through serious withdrawal.      Patient was 4/4 on his CAGE questionnaire.  Will obtain social work      consult in order to make the patient aware of what alcohol services are      available in the area.  3.  Diabetes mellitus.  Will hold Lantus and oral medications while the      patient is n.p.o.  Will also hold the metformin until his creatinine     improves.  Will recover sliding scale insulin in the meantime.  4.  Fluids and electrolytes.  We will  run intravenous fluids at 250 mL/hour.      We will keep strict I's and O's.  Patient is receiving 40 mEq of      potassium here in the emergency room and we will continue to follow his      electrolytes.  Patient with decreased chloride likely secondary to      vomiting losses.  5.  Hyponatremia likely secondary to dehydration.  We will follow how this      responds to intravenous fluids.  6.  Acute renal failure likely prerenal in etiology given dehydration.  We      will hold the patient's Hyzaar and metformin until creatinine improves.      Patient was without obstruction on physical examination.  We will check      a urinalysis as well.  7.  Hypertension.  Will hold blood pressure medications for now.  8.  Anxiety.  Will restart BuSpar once patient is taking p.o.  This is an      issue that will be dealt with further as an outpatient.      Clifton Custard, M.D.    ______________________________  Verner Chol, MD    MR/MEDQ  D:  10/20/2005  T:  10/21/2005  Job:  [id number]

## 2011-02-24 NOTE — Discharge Summary (Signed)
Jeffrey [name], Jeffrey [name]              ACCOUNT NO.:  [id number]  MEDICAL RECORD NO.:  [id number]           PATIENT TYPE:  E  LOCATION:  [facility]                         FACILITY:  [facility]  PHYSICIAN:  Gil Ingwersen A. Walker Kehr, M.D.    DATE OF BIRTH:  1965/12/24  DATE OF ADMISSION:  01/20/2011 DATE OF DISCHARGE:  01/21/2011                              DISCHARGE SUMMARY   PRIMARY CARE PROVIDER:  Esmeralda Arthur, MD at [place].  DISCHARGE DIAGNOSES: 1. Hyperglycemia. 2. Hypertension. 3. Hyperlipidemia. 4. Acute renal insufficiency. 5. Insomnia.  DISCHARGE MEDICATIONS: 1. Lantus SoloSTAR pen 70 units subcutaneously every evening. 2. NovoLog pen 5 units subcutaneously 3 times a day before meals as     needed for CBGs over 200. 3. Aspirin enteric coated 81 mg 1 tablet by mouth daily. 4. Cozaar 50 mg 1 tablet by mouth daily. 5. Metoprolol tartrate 100 mg 1 tablet by mouth daily. 6. Pravachol 80 mg 1 tablet by mouth daily at bedtime. 7. Viagra 100 mg 1 tablet by mouth daily as needed.  Stop taking the following medications: 1. Lasix 20 mg 1 tablet by mouth daily. 2. Trazodone 50 mg 1 tablet by mouth daily at bedtime.  CONSULTS:  None.  PROCEDURES:  None.  LABS AT DISCHARGE:  Sodium 138, potassium 4.0, chloride 100, CO2 31, BUN 7, creatinine 1.03, and glucose 207.  CBC; white count 7.9, hemoglobin 12.5, hematocrit 37.8, platelets 209.  Urine culture showed 40,000 colonies of multiple bacterial morphotypes.  Hemoglobin A1c 11.3. Urinalysis showed over 1000 glucose, moderate bilirubin, small blood, 40 ketones.  BRIEF HOSPITAL COURSE:  Jeffrey [name] is a 45 year old male presenting with 4-day history of nausea, vomiting, and elevated blood sugars.  The patient states that whenever he checked his glucose, the meter states that was too elevated to detect.  The patient admits to being noncompliant when it comes to taking his sliding scale insulin and checking his CBGs.  The  patient also had not been eating in the last 3 days.  He was also noncompliant with his blood pressure medications and therefore he presented to the [facility] emergency department to be evaluated for both hypertension and high blood sugar.  We were called by Elvina Sidle to admit the patient for elevated blood sugars and elevated blood pressure.  1. Hyperglycemia.  The patient's blood glucose in [place] was over     600.  The patient was started on the Glucommander at [facility].     He was given IV fluids and insulin drip.  His sugars came down to     500.  Then by the time he reached [facility], his blood sugars were     289.  We continued the Glucommander on admission and few hours     later, the patient's blood sugars were below 250 and he was     transitioned to Lantus and D5 half normal saline per protocol.  The     patient was feeling better and able to tolerate p.o.  Diabetic     educator was consulted and the patient told both diabetic educator  and myself that the base barrier to his noncompliance is not being     able to check his sugars and uses NovoLog at work due to his     changing schedule and different work shifts.  The patient's blood     sugars remained elevated throughout the hospital course, ranging     from 200s-300s.  It was decided that the patient would benefit from     Lantus 70 units at dinner time or using NovoLog pen with meals with     blood sugars over 200.  The patient was very motivated to change     his lifestyle and start taking care of his diabetes.  He is to     follow up with his PCP for further management and also a referral     was written for outpatient diabetes education.  On the day of     discharge, the patient was no longer nauseated and his symptoms had     resolved. 2. Hypertension.  Blood pressures were stable at time of discharge.     His Cozaar and Lasix were held in lieu of his acute renal     insufficiency on admission.  The  patient was started on metoprolol     100 b.i.d. and his home medication of Cozaar was restarted at     discharge. 3. Hyperlipidemia.  We continued the patient's pravastatin 80 mg at     bedtime.  The patient should follow up with PCP for fasting lipid     panel. 4. Acute renal insufficiency.  This is likely secondary to his     diarrhea, vomiting, and dehydration.  We continued to hydrate with     IV fluids.  The patient's creatinine returned to normal on the day     of discharge. 5. Insomnia.  The patient will be continued on the patient's trazodone     50 at bedtime.  FOLLOWUP APPOINTMENTS:  Please return to Dr. Esmeralda Arthur on Friday, January 25, 2011, at 9:30 a.m.  DISCHARGE INSTRUCTIONS:  ACTIVITY:  No restrictions.  DIET:  Carb-modified.  WOUND CARE:  Not applicable.  SPECIAL INSTRUCTIONS:  Please call MD if blood sugars are over 400 or if you are experiencing any dizziness, weakness, nausea, vomiting, and inability to tolerate food.  The patient's condition at discharge was stable.    ______________________________ Donnamarie Rossetti, MD   ______________________________ Arty Baumgartner. Walker Kehr, M.D.   ID/MEDQ  D:  01/26/2011  T:  01/27/2011  Job:  [id number]  Electronically Signed by Donnamarie Rossetti MD on 02/06/2011 06:40:20 PM Electronically Signed by Candelaria Celeste M.D. on 02/24/2011 03:59:19 PM

## 2011-02-28 ENCOUNTER — Other Ambulatory Visit: Payer: Self-pay | Admitting: Family Medicine

## 2011-02-28 NOTE — Telephone Encounter (Signed)
Refill request

## 2011-03-07 ENCOUNTER — Other Ambulatory Visit: Payer: Self-pay | Admitting: Family Medicine

## 2011-03-07 DIAGNOSIS — E7800 Pure hypercholesterolemia, unspecified: Secondary | ICD-10-CM

## 2011-03-07 DIAGNOSIS — E119 Type 2 diabetes mellitus without complications: Secondary | ICD-10-CM

## 2011-03-08 ENCOUNTER — Other Ambulatory Visit: Payer: Self-pay | Admitting: Family Medicine

## 2011-03-08 DIAGNOSIS — E119 Type 2 diabetes mellitus without complications: Secondary | ICD-10-CM

## 2011-03-08 MED ORDER — INSULIN ASPART 100 UNIT/ML ~~LOC~~ SOLN: SUBCUTANEOUS | Status: DC

## 2011-04-19 ENCOUNTER — Other Ambulatory Visit: Payer: Self-pay | Admitting: *Deleted

## 2011-04-19 MED ORDER — INSULIN PEN NEEDLE 29G X 12MM MISC: Status: DC

## 2011-06-17 ENCOUNTER — Other Ambulatory Visit: Payer: Self-pay | Admitting: Sports Medicine

## 2011-06-17 ENCOUNTER — Telehealth: Payer: Self-pay | Admitting: Sports Medicine

## 2011-06-17 DIAGNOSIS — I10 Essential (primary) hypertension: Secondary | ICD-10-CM

## 2011-06-17 DIAGNOSIS — E7800 Pure hypercholesterolemia, unspecified: Secondary | ICD-10-CM

## 2011-06-17 NOTE — Telephone Encounter (Signed)
Called [facility] lvm for someone to call back.Jeffrey [name]

## 2011-06-17 NOTE — Telephone Encounter (Signed)
[facility] is trying to get in touch with Dr. Paulla Fore about all of his medications.  [facility]'s phone is [phone], ext. [phone] Case [id number]

## 2011-06-18 ENCOUNTER — Other Ambulatory Visit: Payer: Self-pay | Admitting: Family Medicine

## 2011-06-18 ENCOUNTER — Telehealth: Payer: Self-pay | Admitting: *Deleted

## 2011-06-18 DIAGNOSIS — E7800 Pure hypercholesterolemia, unspecified: Secondary | ICD-10-CM

## 2011-06-18 DIAGNOSIS — I10 Essential (primary) hypertension: Secondary | ICD-10-CM

## 2011-06-18 MED ORDER — METOPROLOL SUCCINATE ER 100 MG PO TB24: 100.0000 mg | ORAL_TABLET | Freq: Two times a day (BID) | ORAL | Status: DC

## 2011-06-18 MED ORDER — LOSARTAN POTASSIUM 50 MG PO TABS: 50.0000 mg | ORAL_TABLET | Freq: Every day | ORAL | Status: DC

## 2011-06-18 MED ORDER — PRAVASTATIN SODIUM 80 MG PO TABS: 80.0000 mg | ORAL_TABLET | Freq: Every day | ORAL | Status: DC

## 2011-06-18 NOTE — Progress Notes (Signed)
Called [facility] about 3 requested prescribtions (metoprolol, losartan, pravachol). They said they simply needed them faxed or ordered through Epic. Ordered meds. Routed information to [facility] that meds WILL NOT be refilled again without an appointment as patient hasn't been seen in over 6 months.

## 2011-06-18 NOTE — Telephone Encounter (Signed)
lvm informing pt that he will need to be seen by his pcp to acquire further refills. Asked that he call and schedule an appt ASAP.Marland KitchenMarland KitchenMarland KitchenAudelia Hives [place]

## 2011-06-18 NOTE — Telephone Encounter (Signed)
Dr. Yong Channel has refilled rx and has requested that the pt follow up with pcp. Called and spoke with ms.hopper and informed her that we did fill the rx for 1 month and she scheduled an appt for the pt on Monday 9.17.2012 @ 11 AM. Asked that if he cannot make this appt to call 24 hours in advance to cancel/reschedule. Understood and agreed.Maryruth Eve, [place] Crocker

## 2011-06-24 ENCOUNTER — Ambulatory Visit: Payer: BC Managed Care – PPO | Admitting: Sports Medicine

## 2011-06-27 ENCOUNTER — Ambulatory Visit (INDEPENDENT_AMBULATORY_CARE_PROVIDER_SITE_OTHER): Payer: BC Managed Care – PPO | Admitting: Sports Medicine

## 2011-06-27 VITALS — BP 212/136 | Temp 98.2°F | Resp 64 | Ht 70.0 in | Wt 282.8 lb

## 2011-06-27 DIAGNOSIS — E119 Type 2 diabetes mellitus without complications: Secondary | ICD-10-CM

## 2011-06-27 DIAGNOSIS — Z23 Encounter for immunization: Secondary | ICD-10-CM

## 2011-06-27 DIAGNOSIS — I10 Essential (primary) hypertension: Secondary | ICD-10-CM

## 2011-06-27 LAB — POCT I-STAT 3, ART BLOOD GAS (G3+)
Acid-base deficit: 14 — ABNORMAL HIGH
Acid-base deficit: 6 — ABNORMAL HIGH
Bicarbonate: 11.1 — ABNORMAL LOW
Bicarbonate: 19.4 — ABNORMAL LOW
O2 Saturation: 97
Operator id: 143801
Operator id: 143801
Patient temperature: 37
pCO2 arterial: 25 — ABNORMAL LOW
pO2, Arterial: 124 — ABNORMAL HIGH

## 2011-06-27 LAB — BLOOD GAS, ARTERIAL
Acid-Base Excess: 3.6 — ABNORMAL HIGH
Bicarbonate: 28 — ABNORMAL HIGH
Drawn by: 229971
Drawn by: 229971
O2 Content: 28
O2 Content: 28
O2 Saturation: 99
Patient temperature: 98.6
Patient temperature: 98.6
TCO2: 23.2
TCO2: 26.1
pCO2 arterial: 41.6
pCO2 arterial: 43.8
pH, Arterial: 7.387
pH, Arterial: 7.421
pO2, Arterial: 111 — ABNORMAL HIGH

## 2011-06-27 LAB — BASIC METABOLIC PANEL
BUN: 17
BUN: 19
BUN: 19
BUN: 19
BUN: 24 — ABNORMAL HIGH
BUN: 24 — ABNORMAL HIGH
BUN: 32 — ABNORMAL HIGH
BUN: 63 — ABNORMAL HIGH
CO2: 23
CO2: 23
CO2: 24
CO2: 25
CO2: 28
CO2: 30
CO2: 30
Calcium: 7.6 — ABNORMAL LOW
Calcium: 7.7 — ABNORMAL LOW
Calcium: 7.7 — ABNORMAL LOW
Calcium: 8.2 — ABNORMAL LOW
Calcium: 8.3 — ABNORMAL LOW
Calcium: 8.4
Calcium: 8.6
Calcium: 8.7
Calcium: 8.8
Chloride: 100
Chloride: 101
Chloride: 104
Chloride: 106
Chloride: 107
Chloride: 113 — ABNORMAL HIGH
Chloride: 117 — ABNORMAL HIGH
Chloride: 92 — ABNORMAL LOW
Creatinine, Ser: 2.45 — ABNORMAL HIGH
Creatinine, Ser: 2.78 — ABNORMAL HIGH
Creatinine, Ser: 3.89 — ABNORMAL HIGH
Creatinine, Ser: 3.97 — ABNORMAL HIGH
Creatinine, Ser: 4.15 — ABNORMAL HIGH
Creatinine, Ser: 4.26 — ABNORMAL HIGH
Creatinine, Ser: 5.04 — ABNORMAL HIGH
GFR calc Af Amer: 16 — ABNORMAL LOW
GFR calc Af Amer: 18 — ABNORMAL LOW
GFR calc Af Amer: 19 — ABNORMAL LOW
GFR calc Af Amer: 19 — ABNORMAL LOW
GFR calc Af Amer: 21 — ABNORMAL LOW
GFR calc Af Amer: 30 — ABNORMAL LOW
GFR calc Af Amer: 35 — ABNORMAL LOW
GFR calc non Af Amer: 11 — ABNORMAL LOW
GFR calc non Af Amer: 13 — ABNORMAL LOW
GFR calc non Af Amer: 15 — ABNORMAL LOW
GFR calc non Af Amer: 16 — ABNORMAL LOW
GFR calc non Af Amer: 17 — ABNORMAL LOW
GFR calc non Af Amer: 17 — ABNORMAL LOW
GFR calc non Af Amer: 18 — ABNORMAL LOW
GFR calc non Af Amer: 18 — ABNORMAL LOW
GFR calc non Af Amer: 22 — ABNORMAL LOW
GFR calc non Af Amer: 25 — ABNORMAL LOW
GFR calc non Af Amer: 29 — ABNORMAL LOW
Glucose, Bld: 116 — ABNORMAL HIGH
Glucose, Bld: 121 — ABNORMAL HIGH
Glucose, Bld: 127 — ABNORMAL HIGH
Glucose, Bld: 168 — ABNORMAL HIGH
Glucose, Bld: 39 — CL
Glucose, Bld: 46 — ABNORMAL LOW
Glucose, Bld: 556
Glucose, Bld: 71
Glucose, Bld: 787
Glucose, Bld: 93
Potassium: 2.5 — CL
Potassium: 2.6 — CL
Potassium: 2.8 — ABNORMAL LOW
Potassium: 2.8 — ABNORMAL LOW
Potassium: 3.1 — ABNORMAL LOW
Potassium: 3.3 — ABNORMAL LOW
Potassium: 3.3 — ABNORMAL LOW
Potassium: 3.3 — ABNORMAL LOW
Potassium: 3.4 — ABNORMAL LOW
Potassium: 3.4 — ABNORMAL LOW
Potassium: 3.5
Sodium: 131 — ABNORMAL LOW
Sodium: 131 — ABNORMAL LOW
Sodium: 135
Sodium: 136
Sodium: 137
Sodium: 140
Sodium: 140
Sodium: 141
Sodium: 147 — ABNORMAL HIGH
Sodium: 150 — ABNORMAL HIGH
Sodium: 151 — ABNORMAL HIGH

## 2011-06-27 LAB — CBC
HCT: 21.7 — ABNORMAL LOW
HCT: 21.9 — ABNORMAL LOW
HCT: 23.8 — ABNORMAL LOW
HCT: 24.2 — ABNORMAL LOW
HCT: 25.4 — ABNORMAL LOW
HCT: 39.9
Hemoglobin: 7.4 — CL
Hemoglobin: 8 — ABNORMAL LOW
Hemoglobin: 8.3 — ABNORMAL LOW
Hemoglobin: 8.5 — ABNORMAL LOW
Hemoglobin: 9.1 — ABNORMAL LOW
MCHC: 33.5
MCHC: 33.7
MCHC: 33.7
MCHC: 33.8
MCHC: 33.9
MCHC: 34.2
MCV: 101.9 — ABNORMAL HIGH
MCV: 89.2
MCV: 89.2
MCV: 90.2
MCV: 90.3
MCV: 90.4
Platelets: 248
Platelets: 270
Platelets: 309
Platelets: 318
Platelets: 368
RBC: 2.41 — ABNORMAL LOW
RBC: 2.71 — ABNORMAL LOW
RBC: 2.81 — ABNORMAL LOW
RBC: 2.96 — ABNORMAL LOW
RBC: 2.99 — ABNORMAL LOW
RDW: 14.7
RDW: 14.8
RDW: 15.1
RDW: 15.2
RDW: 15.3
RDW: 15.8 — ABNORMAL HIGH
WBC: 12.7 — ABNORMAL HIGH
WBC: 15.5 — ABNORMAL HIGH
WBC: 9.3
WBC: 9.5

## 2011-06-27 LAB — SODIUM, URINE, RANDOM: Sodium, Ur: 63

## 2011-06-27 LAB — POCT CARDIAC MARKERS
CKMB, poc: 1.6
Operator id: 285841
Troponin i, poc: <0.05

## 2011-06-27 LAB — CALCIUM, IONIZED: Calcium, Ion: 1.02 — ABNORMAL LOW

## 2011-06-27 LAB — CULTURE, BLOOD (ROUTINE X 2)

## 2011-06-27 LAB — CROSSMATCH
ABO/RH(D): O POS
Antibody Screen: NEGATIVE

## 2011-06-27 LAB — URINE MICROSCOPIC-ADD ON

## 2011-06-27 LAB — CARDIAC PANEL(CRET KIN+CKTOT+MB+TROPI)
CK, MB: 2.2
CK, MB: 2.5
Relative Index: 1
Total CK: 161
Troponin I: 0.07 — ABNORMAL HIGH

## 2011-06-27 LAB — COMPREHENSIVE METABOLIC PANEL
AST: 16
Albumin: 3.3 — ABNORMAL LOW
Chloride: <70 — CL
Creatinine, Ser: 6.15 — ABNORMAL HIGH
GFR calc Af Amer: 12 — ABNORMAL LOW
Potassium: 5.3 — ABNORMAL HIGH
Total Bilirubin: 2.3 — ABNORMAL HIGH

## 2011-06-27 LAB — PROTEIN ELECTROPHORESIS, SERUM
Alpha-2-Globulin: 18.1 — ABNORMAL HIGH
Beta 2: 8.1 — ABNORMAL HIGH
Beta Globulin: 5.4
Gamma Globulin: 18.1
M-Spike, %: NOT DETECTED

## 2011-06-27 LAB — LIPID PANEL
LDL Cholesterol: UNDETERMINED
Total CHOL/HDL Ratio: 5.9
Triglycerides: 414 — ABNORMAL HIGH
VLDL: UNDETERMINED

## 2011-06-27 LAB — URINALYSIS, ROUTINE W REFLEX MICROSCOPIC
Glucose, UA: 250 — AB
Glucose, UA: >1000 — AB
Hgb urine dipstick: NEGATIVE
Ketones, ur: NEGATIVE
Leukocytes, UA: NEGATIVE
Protein, ur: NEGATIVE
Specific Gravity, Urine: 1.028
pH: 5
pH: 5.5

## 2011-06-27 LAB — HEPATIC FUNCTION PANEL
ALT: 10
Albumin: 2.7 — ABNORMAL LOW
Alkaline Phosphatase: 65
Indirect Bilirubin: 0.9
Total Protein: 6

## 2011-06-27 LAB — LACTIC ACID, PLASMA: Lactic Acid, Venous: 3.9 — ABNORMAL HIGH

## 2011-06-27 LAB — I-STAT 8, (EC8 V) (CONVERTED LAB)
Acid-base deficit: 13 — ABNORMAL HIGH
BUN: 66 — ABNORMAL HIGH
Bicarbonate: 13.8 — ABNORMAL LOW
Chloride: 73 — CL
Potassium: 3.1 — ABNORMAL LOW
TCO2: 15
pCO2, Ven: 32.5 — ABNORMAL LOW
pCO2, Ven: 36.6 — ABNORMAL LOW
pH, Ven: 7.114 — CL
pH, Ven: 7.234 — ABNORMAL LOW

## 2011-06-27 LAB — C3 COMPLEMENT: C3 Complement: 127

## 2011-06-27 LAB — CK TOTAL AND CKMB (NOT AT ARMC)
CK, MB: 1.6
Total CK: 91

## 2011-06-27 LAB — C4 COMPLEMENT: Complement C4, Body Fluid: 27

## 2011-06-27 LAB — B-NATRIURETIC PEPTIDE (CONVERTED LAB)
Pro B Natriuretic peptide (BNP): 206 — ABNORMAL HIGH
Pro B Natriuretic peptide (BNP): 313 — ABNORMAL HIGH
Pro B Natriuretic peptide (BNP): 347 — ABNORMAL HIGH
Pro B Natriuretic peptide (BNP): 349 — ABNORMAL HIGH

## 2011-06-27 LAB — MAGNESIUM
Magnesium: 1.8
Magnesium: 1.9
Magnesium: 2
Magnesium: 2
Magnesium: 2
Magnesium: 2.2
Magnesium: 2.4
Magnesium: 2.6 — ABNORMAL HIGH

## 2011-06-27 LAB — IRON AND TIBC
Iron: 85
Saturation Ratios: 52
TIBC: 164 — ABNORMAL LOW
UIBC: 79

## 2011-06-27 LAB — PROTIME-INR
INR: 1.2
Prothrombin Time: 15.1

## 2011-06-27 LAB — CREATININE CLEARANCE, URINE, 24 HOUR
Collection Interval-CRCL: 24
Creatinine Clearance: 28 — ABNORMAL LOW
Urine Total Volume-CRCL: 4200

## 2011-06-27 LAB — TYPE AND SCREEN: Antibody Screen: NEGATIVE

## 2011-06-27 LAB — OCCULT BLOOD X 1 CARD TO LAB, STOOL
Fecal Occult Bld: NEGATIVE
Fecal Occult Bld: NEGATIVE

## 2011-06-27 LAB — LIPASE, BLOOD
Lipase: 16
Lipase: >400 — ABNORMAL HIGH

## 2011-06-27 LAB — POCT I-STAT CREATININE: Creatinine, Ser: 4.3 — ABNORMAL HIGH

## 2011-06-27 LAB — PHOSPHORUS
Phosphorus: 2.8
Phosphorus: 3.3
Phosphorus: 3.3
Phosphorus: 5.8 — ABNORMAL HIGH
Phosphorus: <1 — CL

## 2011-06-27 LAB — DIFFERENTIAL
Basophils Absolute: 0
Eosinophils Absolute: 0
Eosinophils Relative: 0
Lymphs Abs: 1.4
Neutrophils Relative %: 86 — ABNORMAL HIGH

## 2011-06-27 LAB — AMYLASE
Amylase: 254 — ABNORMAL HIGH
Amylase: 42

## 2011-06-27 LAB — TROPONIN I: Troponin I: 0.04

## 2011-06-27 LAB — NA AND K (SODIUM & POTASSIUM), 24 H UR
Sodium, 24H Ur: 277 — ABNORMAL HIGH
Urine Total Volume-UNAK24: 4200

## 2011-06-27 LAB — PTH, INTACT AND CALCIUM: Calcium, Total (PTH): 8.8

## 2011-06-27 LAB — PROTEIN, URINE, 24 HOUR
Collection Interval-UPROT: 24
Protein, 24H Urine: 462 — ABNORMAL HIGH

## 2011-06-27 LAB — APTT: aPTT: 21 — ABNORMAL LOW

## 2011-06-27 LAB — ETHANOL: Alcohol, Ethyl (B): <5

## 2011-06-27 LAB — URINE CULTURE: Colony Count: NO GROWTH

## 2011-06-27 NOTE — Patient Instructions (Addendum)
It was nice to meet you today.  I am sending in a new prescription for Cozaar to help with your blood pressure.  I will refill your other medications today as well.  You said that the most important thing you can do is to work on being able to take your medications on a regular schedule.  I think that this is a great goal and fully expect you to come up with a great plan to do so.    You said that you can come back to see me sometime in the next month and I would like to see you then.  Also if you would like to meet with our Health Coach at that visit to help you develop a medication regimen that works for you please let us know.  Take care!

## 2011-07-24 ENCOUNTER — Encounter: Payer: BC Managed Care – PPO | Admitting: *Deleted

## 2011-07-24 ENCOUNTER — Telehealth: Payer: Self-pay | Admitting: *Deleted

## 2011-07-24 DIAGNOSIS — E119 Type 2 diabetes mellitus without complications: Secondary | ICD-10-CM

## 2011-07-24 MED ORDER — INSULIN ASPART 100 UNIT/ML ~~LOC~~ SOLN: SUBCUTANEOUS | Status: DC

## 2011-07-24 NOTE — Telephone Encounter (Signed)
Patient came to office today stating he received one vial of insulin ( Novolog ) and it is only good for 30 days. I called [facility] and they advised that he should have been sent a 90 day supply  . They will do an override and patient can pick up from local pharmacy today. Rx sent to [facility] . [name] Also patient thought he had not received Cozaar. However  later in the conversation  It was realized that he did receive losartan. He just didn't realize this was Cozaar. .   He has to get back to work now but will come tomorrow before work to check BP.

## 2011-07-24 NOTE — Progress Notes (Signed)
This encounter was created in error - please disregard.  This encounter was created in error - please disregard.

## 2011-08-04 NOTE — Assessment & Plan Note (Addendum)
VERY POORLY CONTROLLED.  Not taking medications on regular basis at all.  Pt is meeting with his work to see if they will allow him to take his medications there.  There is some questionable safety concerns regarding administering insulin. I told him if they request any documentation from [name] to let us know and we will immediately provide his work anything they need to assure that he can become more compliant with his medications.  Will plan to follow up as soon a patient is willing.  Unwilling to meet with health coach today but have made pt aware this resource is available to him.    Will re-write for his prior regimens

## 2011-08-04 NOTE — Progress Notes (Signed)
Subjective:  Jeffrey [name] is a 45 y.o. male presenting today for follow up of his diabetes and hypertension.  He states that he has had an extremely difficult time with his medicaitons because he has been working so much that he has not established a good routine.  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  He states that his BP has been very elevated because of missing meds and because his stress level is very high and that his sugars have been poorly controlled due to missed insulin and erratic meal schedules.     ROS: DIABETES: medication compliance: noncompliant much of the time, diabetic diet compliance: noncompliant much of the time, home glucose monitoring: is performed sporadically Hypertension not taking medications regularly as instructed, no TIA's, no chest pain on exertion, no dyspnea on exertion, noting swelling of ankles, does note some dizziness when arising, noting orthopnea, noting episodes of paroxysmal nocturnal dyspnea and no palpitations.   PE: GENERAL:  Middle aged African-American male, appearance greater than age, examined in [place].  Alert and agitated.  In mild discomfort; no respiratory distress. HNEENT: H&N: supple, no adenopathy, trachea midline and no carotid bruits OROPHARYNX: normal findings: lips normal without lesions, buccal mucosa normal and tongue midline and normal and abnormal findings: dentition: multiple carries and gingivitis EYES: PERRLA, no hemorrhages or nicking on opthalamic exam, cataracts   NOSE: normal THORAX: HEART: S1, S2 normal, no murmur, rub or gallop, regular rate and rhythm LUNGS: clear to auscultation, no wheezes or rales and unlabored breathing ABDOMEN:  abdomen is soft without significant tenderness, masses, organomegaly or guarding, protuberant EXTREMITIES: warm, well perfused, 2+/4 LE edema, pulses intact  >NEURO normal  without focal findings, mental status, speech normal, alert and oriented x3, PERLA, muscle tone and strength normal and symmetric, reflexes normal and symmetric and gait and station normal

## 2011-08-04 NOTE — Assessment & Plan Note (Signed)
VERY POORLY CONTROLLED.  NO SIGNS OF END ORGAN DAMAGE.  Pt is working on being able to take his medications with him to work as this is the most consistent place for him to be able to establish a routine.

## 2011-08-18 ENCOUNTER — Inpatient Hospital Stay (HOSPITAL_COMMUNITY)
Admission: EM | Admit: 2011-08-18 | Discharge: 2011-08-21 | DRG: 566 | Disposition: A | Discharge status: Home / Self Care | Payer: BC Managed Care – PPO | Attending: Family Medicine | Admitting: Family Medicine

## 2011-08-18 ENCOUNTER — Other Ambulatory Visit: Payer: Self-pay

## 2011-08-18 DIAGNOSIS — G4700 Insomnia, unspecified: Secondary | ICD-10-CM | POA: Diagnosis present

## 2011-08-18 DIAGNOSIS — E1310 Other specified diabetes mellitus with ketoacidosis without coma: Principal | ICD-10-CM | POA: Diagnosis present

## 2011-08-18 DIAGNOSIS — E7800 Pure hypercholesterolemia, unspecified: Secondary | ICD-10-CM | POA: Diagnosis present

## 2011-08-18 DIAGNOSIS — Z794 Long term (current) use of insulin: Secondary | ICD-10-CM

## 2011-08-18 DIAGNOSIS — E1110 Type 2 diabetes mellitus with ketoacidosis without coma: Secondary | ICD-10-CM

## 2011-08-18 DIAGNOSIS — E875 Hyperkalemia: Secondary | ICD-10-CM | POA: Diagnosis present

## 2011-08-18 DIAGNOSIS — I10 Essential (primary) hypertension: Secondary | ICD-10-CM

## 2011-08-18 DIAGNOSIS — E119 Type 2 diabetes mellitus without complications: Secondary | ICD-10-CM

## 2011-08-18 DIAGNOSIS — E669 Obesity, unspecified: Secondary | ICD-10-CM | POA: Diagnosis present

## 2011-08-18 DIAGNOSIS — E118 Type 2 diabetes mellitus with unspecified complications: Secondary | ICD-10-CM

## 2011-08-18 DIAGNOSIS — F411 Generalized anxiety disorder: Secondary | ICD-10-CM | POA: Diagnosis present

## 2011-08-18 DIAGNOSIS — N179 Acute kidney failure, unspecified: Secondary | ICD-10-CM | POA: Diagnosis present

## 2011-08-18 DIAGNOSIS — Z7982 Long term (current) use of aspirin: Secondary | ICD-10-CM

## 2011-08-18 DIAGNOSIS — Z79899 Other long term (current) drug therapy: Secondary | ICD-10-CM

## 2011-08-18 HISTORY — DX: Essential (primary) hypertension: I10

## 2011-08-18 HISTORY — DX: Encounter for other specified aftercare: Z51.89

## 2011-08-18 LAB — DIFFERENTIAL
Basophils Absolute: 0 10*3/uL (ref 0.0–0.1)
Basophils Relative: 0 % (ref 0–1)
Eosinophils Absolute: 0 10*3/uL (ref 0.0–0.7)
Monocytes Absolute: 1.6 10*3/uL — ABNORMAL HIGH (ref 0.1–1.0)
Monocytes Relative: 9 % (ref 3–12)

## 2011-08-18 LAB — COMPREHENSIVE METABOLIC PANEL
Albumin: 3.7 g/dL (ref 3.5–5.2)
BUN: 41 mg/dL — ABNORMAL HIGH (ref 6–23)
Calcium: 9.5 mg/dL (ref 8.4–10.5)
Creatinine, Ser: 3.1 mg/dL — ABNORMAL HIGH (ref 0.50–1.35)
Total Bilirubin: 0.5 mg/dL (ref 0.3–1.2)
Total Protein: 7.8 g/dL (ref 6.0–8.3)

## 2011-08-18 LAB — BASIC METABOLIC PANEL
CO2: 13 mEq/L — ABNORMAL LOW (ref 19–32)
Chloride: 78 mEq/L — ABNORMAL LOW (ref 96–112)
Potassium: 6.7 mEq/L (ref 3.5–5.1)
Sodium: 129 mEq/L — ABNORMAL LOW (ref 135–145)

## 2011-08-18 LAB — CBC
HCT: 46 % (ref 39.0–52.0)
HCT: 40.6 % (ref 39.0–52.0)
Hemoglobin: 14.6 g/dL (ref 13.0–17.0)
Hemoglobin: 13.2 g/dL (ref 13.0–17.0)
MCH: 31.8 pg (ref 26.0–34.0)
MCHC: 31.7 g/dL (ref 30.0–36.0)
MCV: 97.4 fL (ref 78.0–100.0)
RDW: 14.5 % (ref 11.5–15.5)
RDW: 14.3 % (ref 11.5–15.5)
WBC: 15.4 10*3/uL — ABNORMAL HIGH (ref 4.0–10.5)

## 2011-08-18 LAB — GLUCOSE, CAPILLARY
Glucose-Capillary: >600 mg/dL (ref 70–99)
Glucose-Capillary: 578 mg/dL (ref 70–99)
Glucose-Capillary: >600 mg/dL (ref 70–99)

## 2011-08-18 LAB — LACTIC ACID, PLASMA: Lactic Acid, Venous: 7.3 mmol/L — ABNORMAL HIGH (ref 0.5–2.2)

## 2011-08-18 MED ORDER — DEXTROSE 10 % IV SOLN: INTRAVENOUS | Status: DC

## 2011-08-18 MED ORDER — METOPROLOL SUCCINATE ER 100 MG PO TB24
100.0000 mg | ORAL_TABLET | Freq: Two times a day (BID) | ORAL | Status: DC
  Administered 2011-08-19 (×2): 100 mg via ORAL
  Filled 2011-08-18 (×3): qty 1

## 2011-08-18 MED ORDER — AMLODIPINE BESYLATE 5 MG PO TABS
5.0000 mg | ORAL_TABLET | Freq: Every day | ORAL | Status: DC
  Administered 2011-08-19 – 2011-08-21 (×3): 5 mg via ORAL
  Filled 2011-08-18 (×3): qty 1

## 2011-08-18 MED ORDER — SODIUM CHLORIDE 0.9 % IV SOLN
INTRAVENOUS | Status: DC
  Administered 2011-08-19 (×2): via INTRAVENOUS

## 2011-08-18 MED ORDER — ONDANSETRON HCL 4 MG/2ML IJ SOLN
4.0000 mg | Freq: Once | INTRAMUSCULAR | Status: AC
  Administered 2011-08-18: 4 mg via INTRAVENOUS
  Filled 2011-08-18: qty 2

## 2011-08-18 MED ORDER — HEPARIN SODIUM (PORCINE) 5000 UNIT/ML IJ SOLN
5000.0000 [IU] | Freq: Three times a day (TID) | INTRAMUSCULAR | Status: DC
  Administered 2011-08-19 – 2011-08-21 (×7): 5000 [IU] via SUBCUTANEOUS
  Filled 2011-08-18 (×10): qty 1

## 2011-08-18 MED ORDER — TRAZODONE HCL 50 MG PO TABS
50.0000 mg | ORAL_TABLET | Freq: Every evening | ORAL | Status: DC | PRN
  Filled 2011-08-18: qty 1

## 2011-08-18 MED ORDER — INSULIN ASPART 100 UNIT/ML ~~LOC~~ SOLN: 0.0000 [IU] | SUBCUTANEOUS | Status: DC

## 2011-08-18 MED ORDER — SODIUM CHLORIDE 0.9 % IV SOLN
Freq: Once | INTRAVENOUS | Status: AC
  Administered 2011-08-18: 16:00:00 via INTRAVENOUS

## 2011-08-18 MED ORDER — INSULIN ASPART 100 UNIT/ML ~~LOC~~ SOLN
10.0000 [IU] | Freq: Once | SUBCUTANEOUS | Status: AC
  Administered 2011-08-18: 10 [IU] via INTRAVENOUS

## 2011-08-18 MED ORDER — HYDROMORPHONE HCL PF 1 MG/ML IJ SOLN
1.0000 mg | Freq: Once | INTRAMUSCULAR | Status: AC
  Administered 2011-08-18: 1 mg via INTRAVENOUS
  Filled 2011-08-18: qty 1

## 2011-08-18 MED ORDER — DEXTROSE 50 % IV SOLN: 25.0000 mL | INTRAVENOUS | Status: DC | PRN

## 2011-08-18 MED ORDER — DEXTROSE-NACL 5-0.45 % IV SOLN
INTRAVENOUS | Status: DC
  Administered 2011-08-19: 08:00:00 via INTRAVENOUS

## 2011-08-18 MED ORDER — SODIUM CHLORIDE 0.9 % IV SOLN
INTRAVENOUS | Status: DC
  Administered 2011-08-18: 5.4 [IU]/h via INTRAVENOUS
  Filled 2011-08-18 (×2): qty 1

## 2011-08-18 MED ORDER — SODIUM CHLORIDE 0.9 % IV SOLN
INTRAVENOUS | Status: DC
  Filled 2011-08-18: qty 1

## 2011-08-18 MED ORDER — SODIUM CHLORIDE 0.9 % IV SOLN
Freq: Once | INTRAVENOUS | Status: AC
  Administered 2011-08-18 (×4): via INTRAVENOUS

## 2011-08-18 NOTE — ED Notes (Signed)
Family medicine at bedside.

## 2011-08-18 NOTE — ED Notes (Signed)
Spoke with MD Luberta Mutter and she sts she is coming to ED soon to do the admission.

## 2011-08-18 NOTE — ED Provider Notes (Signed)
History     CSN: [id number] Arrival date & time: 08/18/2011  3:14 PM   First MD Initiated Contact with Patient 08/18/11 1525      Chief Complaint  Patient presents with  . Hyperglycemia    (Consider location/radiation/quality/duration/timing/severity/associated sxs/prior treatment) HPI Comments: History of IDDM, vomiting for 2 days, can't eat or drink.  Sugars high at home.  Patient is a 45 y.o. male presenting with vomiting.  Emesis  This is a new problem. The current episode started more than 2 days ago. The problem occurs 5 to 10 times per day. The problem has been rapidly worsening. The emesis has an appearance of stomach contents. There has been no fever. Associated symptoms include abdominal pain. Pertinent negatives include no chills, no diarrhea and no fever.    No past medical history on file.  No past surgical history on file.  No family history on file.  History  Substance Use Topics  . Smoking status: Never Smoker   . Smokeless tobacco: Not on file  . Alcohol Use: Not on file      Review of Systems  Constitutional: Negative for fever and chills.  Gastrointestinal: Positive for vomiting and abdominal pain. Negative for diarrhea.  All other systems reviewed and are negative.    Allergies  Lisinopril  Home Medications   Current Outpatient Rx  Name Route Sig Dispense Refill  . AMLODIPINE BESYLATE 5 MG PO TABS Oral Take 5 mg by mouth daily.      . ASPIRIN 81 MG PO TBEC Oral Take 81 mg by mouth daily.      . FUROSEMIDE 40 MG PO TABS Oral Take 40 mg by mouth daily.      . INSULIN ASPART 100 UNIT/ML [place] SOLN Subcutaneous Inject 5-10 Units into the skin 3 (three) times daily before meals. As directed by sliding scale     . INSULIN GLARGINE 100 UNIT/ML  SOLN Subcutaneous Inject 70 Units into the skin at bedtime.      [name] LOSARTAN POTASSIUM 50 MG PO TABS Oral Take 1 tablet (50 mg total) by mouth daily. 90 tablet 0    Needs appointment before any additional  refills. A ...  . METOPROLOL SUCCINATE 100 MG PO TB24 Oral Take 1 tablet (100 mg total) by mouth every 12 (twelve) hours. 180 tablet 0    Needs appointment before any additional refills.  [name] PRAVASTATIN SODIUM 80 MG PO TABS Oral Take 1 tablet (80 mg total) by mouth daily. 90 tablet 0    Needs appointment before any additional refills.  [name] SILDENAFIL CITRATE 100 MG PO TABS Oral Take 100 mg by mouth. Take 1 tablet 30 minutes to 4 hours before intercourse     . TRAZODONE HCL 50 MG PO TABS Oral Take 50 mg by mouth at bedtime as needed. For insomnia     . BD PEN NEEDLE ULTRAFINE 29G X 12.7MM MISC      . GLUCOSE BLOOD VI STRP Other 1 each by Other route as needed. Use as instructed     . NEEDLE (DISP) 27G X 1/2" MISC Does not apply by Does not apply route as directed.        SpO2 99%  Physical Exam  Nursing note and vitals reviewed. Constitutional: He is oriented to person, place, and time. He appears well-developed and well-nourished. No distress.  HENT:  Head: Normocephalic and atraumatic.       Mucous membranes dry  Neck: Normal range of motion. Neck supple.  Cardiovascular:  Normal rate and regular rhythm.  Exam reveals no gallop and no friction rub.   No murmur heard. Pulmonary/Chest: Effort normal and breath sounds normal. No respiratory distress.  Abdominal: Soft. Bowel sounds are normal. He exhibits no distension. There is no tenderness.  Musculoskeletal: Normal range of motion.  Lymphadenopathy:    He has no cervical adenopathy.  Neurological: He is alert and oriented to person, place, and time. Coordination normal.  Skin: Skin is warm and dry. He is not diaphoretic.    ED Course  Procedures (including critical care time)   Labs Reviewed  POCT CBG MONITORING  CBC  DIFFERENTIAL  COMPREHENSIVE METABOLIC PANEL  LACTIC ACID, PLASMA   No results found.   No diagnosis found.   Date: 08/18/2011  Rate: 87  Rhythm: normal sinus rhythm  QRS Axis: normal  Intervals: normal   ST/T Wave abnormalities: Peaked T's in V2, V3.    Conduction Disutrbances:none  Narrative Interpretation:   Old EKG Reviewed: changes noted   MDM  Labs reflect DKA.  Will consult medicine for admission.        Veryl Speak, MD 08/18/11 2036

## 2011-08-18 NOTE — ED Notes (Deleted)
PT reports N/V for 4days with high CBG %&)

## 2011-08-18 NOTE — ED Notes (Signed)
CBG > 600  MD made aware

## 2011-08-18 NOTE — ED Notes (Signed)
Pt ambulatory to restroom. Steady gait noted. Pt started on insulin gtt per glucose stabilizer.

## 2011-08-18 NOTE — ED Notes (Signed)
Attempted to call report. RN refused to take report until inpatient room was clean. No estimated time of completion given.

## 2011-08-18 NOTE — H&P (Signed)
[facility] Admission History and Physical  Patient name: Jeffrey [name] Medical record number: [id number] Date of birth: 09-10-1966 Age: 45 y.o. Gender: male  Primary Care Provider: Teresa Coombs, DO, DO  Chief Complaint: hyperglycemia and weakness  History of Present Illness: Jeffrey [name] is a 45 y.o. year old male presenting with hyperglycemia, positive weakness, vision changes, increased urinary frequency, and nausea/ vomiting x2-3 days. Patient states that his blood sugars were in the 400s on Thursday. He said his glucometer broke on that day and did not check his blood sugar weekend. States he stated that we can because he felt so poorly due to the above symptoms. Decided to come in for evaluation in the ER today. Patient admits to missing doses of his insulin. This is a possibly 3 dose of Lantus per week. And also misses an unknown amount of NovoLog while at work-he finds it hard to remember insulin while at work.   Past Medical History: Patient Active Problem List  Diagnoses  . DIABETES MELLITUS II, UNCOMPLICATED  . HYPERCHOLESTEROLEMIA  . OBESITY, NOS  . ANXIETY  . ERECTILE DYSFUNCTION  . INSOMNIA, CHRONIC  . 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 HYPERTENSION, BENIGN SYSTEMIC  . ARTHRITIS  . EDEMA  . ALCOHOL ABUSE, HX OF    Past Surgical History: Knee surgery  Social History: History   Social History  . Marital Status: Married, lives with spouse     Spouse Name: N/A    Number of Children:  4 children on the 39s   . 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 Smoking status: Never Smoker   . Smokeless tobacco:  uses dip daily   . Alcohol Use:  occasional   . Drug Use:  denies   . Sexually Active: Not on file   Family History: Mother-diabetes Father-hypertension  Allergies: Allergies  Allergen Reactions  . Lisinopril       Current Outpatient  Prescriptions  Medication Sig Dispense Refill  . amLODipine (NORVASC) 5 MG tablet Take 5 mg by mouth daily.        [name] aspirin (ADULT ASPIRIN EC LOW STRENGTH) 81 MG EC tablet Take 81 mg by mouth daily.        . furosemide (LASIX) 40 MG tablet Take 40 mg by mouth daily.        . insulin aspart (NOVOLOG) 100 UNIT/ML injection Inject 5-10 Units into the skin 3 (three) times daily before meals. As directed by sliding scale       . insulin glargine (LANTUS) 100 UNIT/ML injection Inject 70 Units into the skin at bedtime.        [name] losartan (COZAAR) 50 MG tablet Take 1 tablet (50 mg total) by mouth daily.  90 tablet  0  . metoprolol (TOPROL-XL) 100 MG 24 hr tablet Take 1 tablet (100 mg total) by mouth every 12 (twelve) hours.  180 tablet  0  . pravastatin (PRAVACHOL) 80 MG tablet Take 1 tablet (80 mg total) by mouth daily.  90 tablet  0  . sildenafil (VIAGRA) 100 MG tablet Take 100 mg by mouth. Take 1 tablet 30 minutes to 4 hours before intercourse       . traZODone (DESYREL) 50 MG tablet Take 50 mg by mouth at bedtime as needed. For insomnia       . DISCONTD: insulin glargine (LANTUS SOLOSTAR) 100 UNIT/ML injection Inject 60 Units  into the skin at bedtime. 35 units in the morning and 35 in the evening  10 mL  6  . BD ULTRA-FINE PEN NEEDLES 29G X 12.7MM MISC       . glucose blood (ASCENSIA ELITE TEST STRIPS) test strip 1 each by Other route as needed. Use as instructed       . NEEDLE, DISP, 27 G (BD DISP NEEDLES) 27G X 1/2" MISC by Does not apply route as directed.         Review Of Systems: Per HPI with the following additions: No chest pain. No shortness of breath. Abdominal pain. No fever. Otherwise 12 point review of systems was performed and was unremarkable.  Physical Exam: Pulse:  75   Blood Pressure:  124/51  RR:  23    O2:  100 on room air  Temp:  98   General: alert, cooperative and appears stated age 7: PERRLA and extra ocular movement intact Heart: S1, S2 normal, no murmur, rub or  gallop, regular rate and rhythm Lungs: clear to auscultation, no wheezes or rales and unlabored breathing Abdomen: abdomen is soft without significant tenderness, masses, organomegaly or guarding Extremities: extremities normal, atraumatic, no cyanosis or edema Skin:no rashes Neurology: normal without focal findings, mental status, speech normal, alert and oriented x3 and PERLA  Labs and Imaging: Lab Results  Component Value Date/Time   NA 128* 08/18/2011  4:10 PM   K 7.5* 08/18/2011  4:10 PM   CL 73* 08/18/2011  4:10 PM   CO2 7* 08/18/2011  4:10 PM   BUN 41* 08/18/2011  4:10 PM   CREATININE 3.10* 08/18/2011  4:10 PM   CREATININE 4.36* 10/17/2007 12:48 PM   GLUCOSE 730* 08/18/2011  4:10 PM   Lab Results  Component Value Date   WBC 17.8* 08/18/2011   HGB 14.6 08/18/2011   HCT 46.0 08/18/2011   MCV 100.2* 08/18/2011   PLT 268 08/18/2011   Gap- 48 AST- 54 ALT- 66 Lactic acid- 7.3   Assessment and Plan: Jeffrey [name] is a 45 y.o. year old male presenting with hyperglycemia-found to be in DKA. 1. DKA/diabetes: Patient in DKA, anion gap of 48. Treating with insulin drip per the DKA protocol. Patient has received a total of 3 L nasal saline. We'll need to consult inpatient diabetes education to help [name] develop plan to help patient avoid future episodes of DKA. We'll check anion gap every 2-4 hours.  2. Hyperkalemia: Potassium 7.5 on arrival. Improving. Most likely elevated to 2 elevated glucose in serum. Should shift back into cells once glucose regulated. We'll follow potassium closely every 2 hours.  3. Elevated creatinine: Creatinine 3.10. Will first give fluid resuscitation since most likely this is due to prerenal insult from dehydration in the setting of DKA.  4. Hypercholesterolemia: We'll hold on the lower medications while inpatient.  5. Insomnia, chronic: Continue trazodone as needed for insomnia.  6. History of alcohol abuse: Patient denies EtOH use beyond  that of casual. States he certainly hasn't had a drink since his last admission 7. Hypertension: Continue Norvasc and metoprolol. Held Cozaar and Lasix due to elevated creatinine  8. FEN/GI: NPO until on SQ insulin  9. Prophylaxis: heparin 5000 units TID  10. Disposition: pending clinical improvement.

## 2011-08-19 ENCOUNTER — Encounter (HOSPITAL_COMMUNITY): Payer: Self-pay | Admitting: Emergency Medicine

## 2011-08-19 LAB — BASIC METABOLIC PANEL
BUN: 49 mg/dL — ABNORMAL HIGH (ref 6–23)
BUN: 50 mg/dL — ABNORMAL HIGH (ref 6–23)
BUN: 50 mg/dL — ABNORMAL HIGH (ref 6–23)
BUN: 49 mg/dL — ABNORMAL HIGH (ref 6–23)
BUN: 47 mg/dL — ABNORMAL HIGH (ref 6–23)
CO2: 16 mEq/L — ABNORMAL LOW (ref 19–32)
CO2: 24 mEq/L (ref 19–32)
CO2: 27 mEq/L (ref 19–32)
CO2: 29 mEq/L (ref 19–32)
CO2: 29 mEq/L (ref 19–32)
Calcium: 8.6 mg/dL (ref 8.4–10.5)
Chloride: 88 mEq/L — ABNORMAL LOW (ref 96–112)
Chloride: 93 mEq/L — ABNORMAL LOW (ref 96–112)
Chloride: 97 mEq/L (ref 96–112)
Chloride: 96 mEq/L (ref 96–112)
Creatinine, Ser: 3.42 mg/dL — ABNORMAL HIGH (ref 0.50–1.35)
Creatinine, Ser: 2.4 mg/dL — ABNORMAL HIGH (ref 0.50–1.35)
Creatinine, Ser: 2.15 mg/dL — ABNORMAL HIGH (ref 0.50–1.35)
Creatinine, Ser: 1.9 mg/dL — ABNORMAL HIGH (ref 0.50–1.35)
GFR calc Af Amer: 25 mL/min — ABNORMAL LOW (ref >90)
Glucose, Bld: 516 mg/dL — ABNORMAL HIGH (ref 70–99)
Glucose, Bld: 334 mg/dL — ABNORMAL HIGH (ref 70–99)
Glucose, Bld: 82 mg/dL (ref 70–99)
Glucose, Bld: 104 mg/dL — ABNORMAL HIGH (ref 70–99)
Glucose, Bld: 88 mg/dL (ref 70–99)
Potassium: 5 mEq/L (ref 3.5–5.1)
Potassium: 4.8 mEq/L (ref 3.5–5.1)
Potassium: 4.2 mEq/L (ref 3.5–5.1)
Potassium: 4.1 mEq/L (ref 3.5–5.1)
Potassium: 4.1 mEq/L (ref 3.5–5.1)
Sodium: 133 mEq/L — ABNORMAL LOW (ref 135–145)
Sodium: 134 mEq/L — ABNORMAL LOW (ref 135–145)
Sodium: 139 mEq/L (ref 135–145)

## 2011-08-19 LAB — GLUCOSE, CAPILLARY
Glucose-Capillary: 431 mg/dL — ABNORMAL HIGH (ref 70–99)
Glucose-Capillary: 530 mg/dL — ABNORMAL HIGH (ref 70–99)
Glucose-Capillary: 128 mg/dL — ABNORMAL HIGH (ref 70–99)
Glucose-Capillary: 98 mg/dL (ref 70–99)
Glucose-Capillary: 109 mg/dL — ABNORMAL HIGH (ref 70–99)
Glucose-Capillary: 141 mg/dL — ABNORMAL HIGH (ref 70–99)
Glucose-Capillary: 78 mg/dL (ref 70–99)
Glucose-Capillary: 88 mg/dL (ref 70–99)
Glucose-Capillary: 89 mg/dL (ref 70–99)

## 2011-08-19 LAB — MRSA PCR SCREENING: MRSA by PCR: NEGATIVE

## 2011-08-19 MED ORDER — SODIUM CHLORIDE 0.9 % IV BOLUS (SEPSIS)
1000.0000 mL | Freq: Once | INTRAVENOUS | Status: AC
  Administered 2011-08-19: 1000 mL via INTRAVENOUS

## 2011-08-19 MED ORDER — INSULIN ASPART 100 UNIT/ML ~~LOC~~ SOLN
0.0000 [IU] | Freq: Three times a day (TID) | SUBCUTANEOUS | Status: DC
  Administered 2011-08-20 (×2): 3 [IU] via SUBCUTANEOUS
  Administered 2011-08-20: 8 [IU] via SUBCUTANEOUS
  Administered 2011-08-21: 5 [IU] via SUBCUTANEOUS
  Administered 2011-08-21: 8 [IU] via SUBCUTANEOUS
  Filled 2011-08-19: qty 3

## 2011-08-19 MED ORDER — METOPROLOL SUCCINATE ER 50 MG PO TB24
50.0000 mg | ORAL_TABLET | Freq: Two times a day (BID) | ORAL | Status: DC
  Administered 2011-08-20: 50 mg via ORAL
  Filled 2011-08-19 (×3): qty 1

## 2011-08-19 MED ORDER — GI COCKTAIL ~~LOC~~
30.0000 mL | Freq: Once | ORAL | Status: AC
  Administered 2011-08-19: 30 mL via ORAL
  Filled 2011-08-19: qty 30

## 2011-08-19 NOTE — Progress Notes (Signed)
Inpatient Diabetes Program Recommendations  AACE/ADA: New Consensus Statement on Inpatient Glycemic Control (2009)  Target Ranges:  Prepandial:   less than 140 mg/dL      Peak postprandial:   less than 180 mg/dL (1-2 hours)      Critically ill patients:  140 - 180 mg/dL   Reason for Visit: Admitted with DKA. On insulin stabilizer. Not taking insulin consistently at home.  Inpatient Diabetes Program Recommendations Insulin - Basal: Needs basal insulin 1-2 hours before IV insulin gtt stopped. Timing of home lantus needs to be based on work schedule. Insulin - Meal Coverage: Can eat while on glucostabilizer. Once transitioned to sq, will need meal coverage order. Outpatient Referral: May benefit from OP education f/u regarding timing of insulin & general care in relation to work sehedule  Note: Patient states he needs a career change because he feels his job interferes with his diabetes management.  Discussed possibility of working out timing of lantus insulin so he can take consistently.  Normally works 3-11 PM, C/O having to work late-- sometimes until 40 or later. Suggested perhaps a 2PM schedule for lantus may be more compatible with work schedule & home schedule-- but needs to work out with MD.  States home dose of Lantus is supposed to be 76 units. Emphasized need to take novolog with him to work to cover meals. Patient seems skeptical that diabetes can be controlled while he is working at current job. States he controls it well when not working. Per H&P meter at home broken.

## 2011-08-19 NOTE — Progress Notes (Signed)
Subjective: Pt states he feels a little better than yesterday.  Still feels nauseous and has vomited a few times.  His vision continues to be blurry.  States he is very tired.  Objective: Vital signs in last 24 hours: Temp:  [97 F (36.1 C)-98.4 F (36.9 C)] 98.4 F (36.9 C) (11/12 0331) Pulse Rate:  [75-98] 78  (11/12 0331) Resp:  [6-23] 6  (11/12 0331) BP: (91-124)/(44-56) 94/44 mmHg (11/12 0331) SpO2:  [96 %-100 %] 96 % (11/12 0331) Weight:  [258 lb 13.1 oz (117.4 kg)] 258 lb 13.1 oz (117.4 kg) (11/12 0002) Weight change:  Last BM Date: 08/18/11  Intake/Output from previous day: 11/11 0701 - 11/12 0700 In: 863.3 [I.V.:863.3] Out: 400 [Urine:400] Intake/Output this shift:    General appearance: alert, cooperative, fatigued and no distress Resp: clear to auscultation bilaterally Cardio: regular rate and rhythm, S1, S2 normal, no murmur, click, rub or gallop GI: soft, non-tender; bowel sounds normal; no masses,  no organomegaly Extremities: extremities normal, atraumatic, no cyanosis or edema  Lab Results:  [facility] 08/18/11 2238 08/18/11 1610  WBC 15.4* 17.8*  HGB 13.2 14.6  HCT 40.6 46.0  PLT 258 268   BMET  Basename 08/19/11 0408 08/19/11 0212  NA 138 134*  K 4.2 4.8  CL 93* 88*  CO2 24 20  GLUCOSE 181* 334*  BUN 50* 49*  CREATININE 3.42* 3.16*  CALCIUM 8.9 8.9    Studies/Results: No results found.  Medications:  I have reviewed the patient's current medications. Scheduled:   . sodium chloride   Intravenous Once  . sodium chloride   Intravenous Once  . amLODipine  5 mg Oral Daily  . gi cocktail  30 mL Oral Once  . heparin  5,000 Units Subcutaneous Q8H  .  HYDROmorphone (DILAUDID) injection  1 mg Intravenous Once  . insulin aspart  0-3 Units Subcutaneous Q4H  . insulin aspart  10 Units Intravenous Once  . metoprolol  100 mg Oral Q12H  . ondansetron (ZOFRAN) IV  4 mg Intravenous Once   Continuous:   . sodium chloride 150 mL/hr at 08/19/11 0639    . dextrose 5 % and 0.45% NaCl 125 mL/hr at 08/19/11 0900  . insulin (NOVOLIN-R) infusion 16.2 Units/hr (08/18/11 2333)  . insulin (NOVOLIN-R) infusion Stopped (08/19/11 0800)  . DISCONTD: dextrose     [id number], traZODone  Assessment/Plan: Tyanthony Sathre is a 45 y.o. year old male with a history of DM, hypercholesterolemia, and HTN presenting with hyperglycemia-found to be in DKA.  1. DKA/diabetes:  Patient in DKA, anion gap of 48 upon presentation.  Currently gap of 21. -Treating with insulin drip per the DKA protocol. -will transition to subcutaneous insulin once gap closed -D5 1/2 NS @125  mL/hr -consult inpatient diabetes education to help [name] develop plan to help patient avoid future episodes of DKA.  Pt expresses some concern about being able to control DM.  States he realizes it is on him to take control, but he is not sure if he will be able to do this. -check anion gap every 2 hours until closed.  2. Hyperkalemia:  Potassium 7.5 on arrival. Improved to 4.2. Most likely elevated due to elevated glucose in serum. Should shift back into cells once glucose regulated. Continue to follow.  3. Elevated creatinine:  Creatinine 3.10.  Currently 3.42. Continue fluid resuscitation since most likely this is due to prerenal insult from dehydration in the setting of DKA. Given 1 L NS bolus. Will continue to monitor. 4.  Hypercholesterolemia:  -hold home medications while inpatient.  5. Insomnia, chronic:  Continue trazodone as needed for insomnia.  6. History of alcohol abuse:  Patient denies EtOH use beyond that of casual. States he certainly hasn't had a drink since his last admission  7. Hypertension: BP remains on the low side, will monitor. Continue metoprolol. D/c Norvasc. Held Cozaar and Lasix due to elevated creatinine  8. FEN/GI: NPO until on SQ insulin  9. Prophylaxis: heparin 5000 units TID  10. Disposition: pending clinical improvement.   LOS: 1 day    Maxten Shuler 08/19/2011, 8:39 AM

## 2011-08-19 NOTE — Progress Notes (Signed)
I have seen and examined this patient. I have discussed the initial evaluation with Dr Luberta Mutter and the team.  I agree with their findings and plans as documented in their admission and progress notes for today. We discussed the possibility of autonomic neuropathy of his bladder and possible need for a foley if his PVR is increased.

## 2011-08-19 NOTE — Plan of Care (Signed)
Problem: Phase II Progression Outcomes Goal: Progress activity as tolerated unless otherwise ordered Pt able to ambulate to restroom with no issues this am.

## 2011-08-19 NOTE — H&P (Signed)
Discussed with Dr. Luberta Mutter.  Agree with her documentation.  Dr. Walker Kehr saw this patient - please see his progress note of today for attending admit note.

## 2011-08-19 NOTE — Plan of Care (Signed)
Problem: Phase I Progression Outcomes Goal: OOB as tolerated unless otherwise ordered Patient ambulated to restroom with no issues.

## 2011-08-20 LAB — GLUCOSE, CAPILLARY
Glucose-Capillary: 112 mg/dL — ABNORMAL HIGH (ref 70–99)
Glucose-Capillary: 156 mg/dL — ABNORMAL HIGH (ref 70–99)
Glucose-Capillary: 191 mg/dL — ABNORMAL HIGH (ref 70–99)

## 2011-08-20 LAB — BASIC METABOLIC PANEL
Calcium: 8.5 mg/dL (ref 8.4–10.5)
Chloride: 95 mEq/L — ABNORMAL LOW (ref 96–112)
Creatinine, Ser: 1.19 mg/dL (ref 0.50–1.35)
GFR calc Af Amer: 83 mL/min — ABNORMAL LOW (ref >90)
Sodium: 133 mEq/L — ABNORMAL LOW (ref 135–145)

## 2011-08-20 MED ORDER — INSULIN GLARGINE 100 UNIT/ML ~~LOC~~ SOLN
15.0000 [IU] | Freq: Every day | SUBCUTANEOUS | Status: DC
  Administered 2011-08-20 – 2011-08-21 (×2): 15 [IU] via SUBCUTANEOUS
  Filled 2011-08-20 (×2): qty 3

## 2011-08-20 MED ORDER — ACETAMINOPHEN 325 MG PO TABS
650.0000 mg | ORAL_TABLET | Freq: Three times a day (TID) | ORAL | Status: DC | PRN
  Administered 2011-08-20: 650 mg via ORAL
  Filled 2011-08-20: qty 2

## 2011-08-20 MED ORDER — METOPROLOL SUCCINATE ER 50 MG PO TB24
50.0000 mg | ORAL_TABLET | Freq: Every day | ORAL | Status: DC
  Administered 2011-08-21: 50 mg via ORAL
  Filled 2011-08-20: qty 1

## 2011-08-20 MED ORDER — HYDRALAZINE HCL 20 MG/ML IJ SOLN
10.0000 mg | INTRAMUSCULAR | Status: DC | PRN
  Filled 2011-08-20: qty 0.5

## 2011-08-20 NOTE — Progress Notes (Signed)
Subjective: Pt states he feels much improved from yesterday.  Has no complaints of nausea or vomiting this morning.  Objective: Vital signs in last 24 hours: Temp:  [98 F (36.7 C)-98.6 F (37 C)] 98.1 F (36.7 C) (11/13 0806) Pulse Rate:  [59-72] 62  (11/13 0806) Resp:  [13-24] 18  (11/13 0806) BP: (113-156)/(55-92) 141/91 mmHg (11/13 0806) SpO2:  [97 %-100 %] 100 % (11/13 0806) Weight change:  Last BM Date: 08/18/11  Intake/Output from previous day: 11/12 0701 - 11/13 0700 In: 2020 [P.O.:720; I.V.:1300] Out: 1450 [Urine:1450] Intake/Output this shift:    General appearance: alert, cooperative, and no distress ENT: moist mucus membranes Resp: clear to auscultation bilaterally Cardio: regular rate and rhythm, S1, S2 normal, no murmur, click, rub or gallop GI: soft, non-tender; bowel sounds normal; no masses,  no organomegaly Extremities: extremities normal, atraumatic, no cyanosis or edema  Lab Results:  [facility] 08/18/11 2238 08/18/11 1610  WBC 15.4* 17.8*  HGB 13.2 14.6  HCT 40.6 46.0  PLT 258 268   BMET  Basename 08/20/11 0915 08/19/11 1725  NA 133* 139  K 3.4* 3.8  CL 95* 97  CO2 28 29  GLUCOSE 169* 109*  BUN 26* 44*  CREATININE 1.19 1.90*  CALCIUM 8.5 8.6    Studies/Results: No results found.  Medications:  I have reviewed the patient's current medications. Scheduled:    . amLODipine  5 mg Oral Daily  . heparin  5,000 Units Subcutaneous Q8H  . insulin aspart  0-15 Units Subcutaneous TID WC  . insulin glargine  15 Units Subcutaneous Daily  . metoprolol  50 mg Oral Daily  . DISCONTD: insulin aspart  0-3 Units Subcutaneous Q4H  . DISCONTD: metoprolol  100 mg Oral Q12H  . DISCONTD: metoprolol  50 mg Oral Q12H   Continuous:    . sodium chloride 500 mL/hr at 08/19/11 1300  . DISCONTD: dextrose 5 % and 0.45% NaCl 125 mL/hr at 08/19/11 1800  . DISCONTD: insulin (NOVOLIN-R) infusion 16.2 Units/hr (08/18/11 2333)  . DISCONTD: insulin (NOVOLIN-R)  infusion Stopped (08/19/11 0800)   [id number], traZODone  Assessment/Plan: Jeffrey [name] is a 45 y.o. year old male with a history of DM, hypercholesterolemia, and HTN presenting with hyperglycemia-found to be in DKA.  1. DKA/diabetes:  Patient in DKA, anion gap of 48 upon presentation.  Last gap 10. -insulin drip d/c'd and patient was transitioned to subcutaneous insulin. -inpatient diabetes education outlined possible plan, will need to help pt implement this upon discharge. Pt will also need new meter for home as his is broken.  2. Hyperkalemia:  Potassium 7.5 on arrival. Last was 3.4. Most likely elevated due to elevated glucose in serum.   3. Elevated creatinine:  Creatinine 3.10 on admission.  Increased to 3.42. Has trended down to 1.19. Continue fluid resuscitation since most likely this is due to prerenal insult from dehydration in the setting of DKA. Bladder scan had 65 mL post-void. Will continue to monitor. 4. Hypercholesterolemia:  -hold home medications while inpatient.  5. Insomnia, chronic:  Continue trazodone as needed for insomnia.  6. History of alcohol abuse:  Patient denies EtOH use beyond that of casual. States he certainly hasn't had a drink since his last admission  7. Hypertension: BP had been on the low side.  After discontinuing norvasc yesterday pt's BP has been slightly elevated. Norvasc restarted. Metoprolol dose decreased to 50 mg once daily due to bradycardia. Held Cozaar and Lasix due to elevated creatinine  8. FEN/GI: Carb modified  diet.  9. Prophylaxis: heparin 5000 units TID  10. Disposition: transfer to floor today, if pt does well could go home late this afternoon or tomorrow.   LOS: 2 days   Honesty Menta 08/20/2011, 8:31 AM

## 2011-08-20 NOTE — Progress Notes (Signed)
I discussed the care plan with Dr.Caviness and the FPTS team and agree with assessment and plan as documented in the progress note for today.Patrick Jupiter A. Walker Kehr, MD Family Medicine Teaching Service Attending  08/20/2011 7:52 PM

## 2011-08-21 LAB — BASIC METABOLIC PANEL
BUN: 13 mg/dL (ref 6–23)
Calcium: 8.7 mg/dL (ref 8.4–10.5)
GFR calc Af Amer: >90 mL/min (ref >90)
GFR calc non Af Amer: >90 mL/min (ref >90)
Glucose, Bld: 252 mg/dL — ABNORMAL HIGH (ref 70–99)
Sodium: 132 mEq/L — ABNORMAL LOW (ref 135–145)

## 2011-08-21 LAB — GLUCOSE, CAPILLARY: Glucose-Capillary: 220 mg/dL — ABNORMAL HIGH (ref 70–99)

## 2011-08-21 MED ORDER — PRAVASTATIN SODIUM 80 MG PO TABS: 80.0000 mg | ORAL_TABLET | Freq: Every day | ORAL | Status: DC

## 2011-08-21 MED ORDER — FREESTYLE SYSTEM KIT: 1.0000 | PACK | Status: AC | PRN

## 2011-08-21 MED ORDER — LOSARTAN POTASSIUM 50 MG PO TABS: 50.0000 mg | ORAL_TABLET | Freq: Every day | ORAL | Status: DC

## 2011-08-21 MED ORDER — FUROSEMIDE 40 MG PO TABS: 40.0000 mg | ORAL_TABLET | Freq: Every day | ORAL | Status: DC

## 2011-08-21 MED ORDER — INSULIN PEN NEEDLE 29G X 12.7MM MISC: 1.0000 | Freq: Three times a day (TID) | Status: DC

## 2011-08-21 MED ORDER — INSULIN GLARGINE 100 UNIT/ML ~~LOC~~ SOLN: 35.0000 [IU] | Freq: Every day | SUBCUTANEOUS | Status: DC

## 2011-08-21 MED ORDER — ASPIRIN 81 MG PO TBEC: 81.0000 mg | DELAYED_RELEASE_TABLET | Freq: Every day | ORAL | Status: DC

## 2011-08-21 MED ORDER — GLUCOSE BLOOD VI STRP: ORAL_STRIP | Status: DC

## 2011-08-21 MED ORDER — SILDENAFIL CITRATE 100 MG PO TABS: 100.0000 mg | ORAL_TABLET | ORAL | Status: DC | PRN

## 2011-08-21 MED ORDER — INSULIN ASPART 100 UNIT/ML ~~LOC~~ SOLN: SUBCUTANEOUS | Status: DC

## 2011-08-21 NOTE — Progress Notes (Signed)
I interviewed and examined this patient and discussed the care plan with Dr. Darrick Grinder and the [facility] team and agree with assessment and plan as documented in the discharge note for today. He voiced his intention to do better with his diet.     [place] A. Walker Kehr, [place] Service Attending  08/21/2011 6:32 PM

## 2011-08-21 NOTE — Progress Notes (Signed)
IV D/C. Catheter intact. Discharge instructions and education discussed with patient. He verbalized understanding and had no questions. The patient will follow up with his doctor on Nov 21. Deveron Furlong, Therapist, sports, BSN

## 2011-08-21 NOTE — Progress Notes (Signed)
Subjective: Pt states he feels good this morning.  Has a little phlegm this morning, but no nausea or abdominal pain.  Had a headache overnight that resolved with tylenol.  Ready to go home.  Objective: Vital signs in last 24 hours: Temp:  [97.2 F (36.2 C)-98.5 F (36.9 C)] 98.2 F (36.8 C) (11/13 2200) Pulse Rate:  [57-65] 59  (11/13 2200) Resp:  [13-20] 20  (11/13 2200) BP: (141-173)/(84-97) 149/84 mmHg (11/13 2200) SpO2:  [97 %-100 %] 100 % (11/13 2200) Weight:  [266 lb 3.2 oz (120.748 kg)] 266 lb 3.2 oz (120.748 kg) (11/13 1722) Weight change:  Last BM Date: 08/18/11  Intake/Output from previous day: 11/13 0701 - 11/14 0700 In: -  Out: 550 [Urine:550] Intake/Output this shift:    General appearance: alert, cooperative, and no distress ENT: moist mucus membranes, sclera white Resp: clear to auscultation bilaterally Cardio: regular rate and rhythm, S1, S2 normal, no murmur, click, rub or gallop GI: soft, non-tender; bowel sounds normal; no masses,  no organomegaly Extremities: extremities normal, atraumatic, no cyanosis or edema, 2+ DP pulses  Lab Results:  Basename 08/18/11 2238 08/18/11 1610  WBC 15.4* 17.8*  HGB 13.2 14.6  HCT 40.6 46.0  PLT 258 268   BMET  Basename 08/21/11 0614 08/20/11 0915  NA 132* 133*  K 3.6 3.4*  CL 92* 95*  CO2 27 28  GLUCOSE 252* 169*  BUN 13 26*  CREATININE 0.91 1.19  CALCIUM 8.7 8.5    Studies/Results: No results found.  Medications:  I have reviewed the patient's current medications. Scheduled:    . amLODipine  5 mg Oral Daily  . heparin  5,000 Units Subcutaneous Q8H  . insulin aspart  0-15 Units Subcutaneous TID WC  . insulin glargine  15 Units Subcutaneous Daily  . metoprolol  50 mg Oral Daily  . DISCONTD: metoprolol  50 mg Oral Q12H   Continuous:    . DISCONTD: sodium chloride 500 mL/hr at 08/19/11 1300   [id number], dextrose, hydrALAZINE, traZODone  Assessment/Plan: Jeffrey [name] is a 45 y.o.  year old male with a history of DM, hypercholesterolemia, and HTN presenting with hyperglycemia-found to be in DKA.   1. DKA/diabetes:  Patient in DKA, anion gap of 48 upon presentation.  Last gap 10.  Now resolved with blood sugars in the 200s.  Off the insulin drip and started on lantus yesterday afternoon.  Has been seen by the diabetes educator.  Pt thinks getting a novolog pen would help his compliance, particularly at work. He also needs a new meter as his is broken.  2. Hyperkalemia:  Potassium was 7.5 on arrival; this is likely due to DKA.  His potassium trended down during this hospitalization and is stable at discharge around 3.5.  He did not require any repletion.    3. Elevated creatinine:  Creatinine 3.10 on admission.  Increased to 3.42. Has trended down; now back to baseline at 0.91.  AKI likely secondary to volume depletion from DKA.   4. Hypercholesterolemia:  -hold home medications while inpatient.   5. Insomnia, chronic:  Continue trazodone as needed for insomnia.   6. History of alcohol abuse:  Patient denies EtOH use beyond that of casual. States he certainly hasn't had a drink since his last admission   7. Hypertension: BP was initially low in the 90s/50s.  His cozaar and lasix were held due to low pressures and elevated creatinine.  His metoprolol and norvasc were continued.  As his DKA resolved, his  blood pressure increased and his creatinine has returned to normal.  He did require one dose of IV hydralazine.  Will restart his lasix and cozaar at discharge.    8. FEN/GI: Carb modified diet.  9. Prophylaxis: heparin 5000 units TID  10. Disposition: discharge today   LOS: 3 days   BOOTH, ERIN Pager: [phone] 08/21/2011, 7:37 AM

## 2011-08-21 NOTE — Discharge Summary (Signed)
Discharge Summary 08/21/2011 10:22 AM  Jeffrey [name] DOB: 1966-05-15 MRN: [id number]  Date of Admission: 08/18/2011 Date of Discharge: 08/21/2011  PCP: Teresa Coombs, DO, DO Consultants: none  Reason for Admission: DKA  Discharge Diagnosis Primary 1. DKA, resolved 2. Diabetes, type II 3. AKI, resolved Secondary 1. Hypertension  2. Hypercholesterolemia  Hospital Course: Jeffrey [name] is a 45 y.o. year old male with a history of DM, hypercholesterolemia, and HTN presenting with hyperglycemia-found to be in DKA.   DKA/diabetes: Patient was found to be in DKA on admission with an anion gap of 48.  He was started on the DKA protocol and received 3L of NS.  Once his anion gap resolved, he was transitioned to subcutaneous insulin.  The diabetes education was consulted and saw the patient. At discharge his blood glucose was in the 200s.  He was discharged with lantus 35 units daily (down from home dose of 76 units daily) and a Novolog sliding scale.  He was discharged with a prescription for a novolog flexpen as he thinks that will help him be complaint at work. He was also given a prescription for a new glucometer.   Hyperkalemia: Potassium was 7.5 on arrival; this was likely due to cellular shifts from DKA.  An ECG was obtained and there was concern for peaked T-waves.  However, given that he was likely total body potassium down and that the potassium was likely to shift intracellularly once DKA improved, he was monitored with q2 Bmets.  His potassium was monitored and trended down during this hospitalization.  At discharge it is stable around 3.5. He did not require any repletion.   Elevated creatinine: His creatinine was elevated to 3.10 on admission; it peaked at 3.42 before trending down to normal.  It is 0.91 at discharge.  AKI likely secondary to volume depletion from DKA.   Hypercholesterolemia: Stable this admission.  His home medications were held while inpatient.    Insomnia, chronic: Continue trazodone as needed for insomnia.   History of alcohol abuse: Patient denies EtOH use beyond that of casual. States he certainly hasn't had a drink since his last admission.   Hypertension: BP was initially low in the 90s/50s. His cozaar and lasix were held due to low pressures and elevated creatinine. His metoprolol and norvasc were continued. As his DKA resolved, his blood pressure increased and his creatinine has returned to normal. He did require one dose of IV hydralazine. Will restart his lasix and cozaar at discharge.    Procedures: none  Discharge Medications Medication List  As of 08/21/2011 10:22 AM   START taking these medications         glucose monitoring kit monitoring kit   1 each by Does not apply route as needed for other. Please dispense one glucometer and the necessary accessories.  He has test strips for the [employer].         CHANGE how you take these medications         glucose blood test strip   Use as instructed   What changed: - dose - route (how to take the med) - how often to take the med - reasons to take the med      insulin aspart 100 UNIT/ML injection   Commonly known as: novoLOG   Inject 5-10 units before meals as directed by your sliding scale.   What changed: - dose - route (how to take the med) - how often to take the med - doctor's  instructions      insulin glargine 100 UNIT/ML injection   Commonly known as: LANTUS   Inject 35 Units into the skin at bedtime.   What changed: dose      Insulin Pen Needle 29G X 12.7MM Misc   Inject 1 Device into the skin 3 (three) times daily before meals.   What changed: - dose - route (how to take the med) - how often to take the med      sildenafil 100 MG tablet   Commonly known as: VIAGRA   Take 1 tablet (100 mg total) by mouth as needed for erectile dysfunction. Take 1 tablet 30 minutes to 4 hours before intercourse   What changed: - how often to take the  med - reasons to take the med         CONTINUE taking these medications         amLODipine 5 MG tablet   Commonly known as: NORVASC      aspirin 81 MG EC tablet   Take 1 tablet (81 mg total) by mouth daily.      furosemide 40 MG tablet   Commonly known as: LASIX   Take 1 tablet (40 mg total) by mouth daily.      losartan 50 MG tablet   Commonly known as: COZAAR   Take 1 tablet (50 mg total) by mouth daily.      metoprolol 100 MG 24 hr tablet   Commonly known as: TOPROL-XL   Take 1 tablet (100 mg total) by mouth every 12 (twelve) hours.      pravastatin 80 MG tablet   Commonly known as: PRAVACHOL   Take 1 tablet (80 mg total) by mouth daily.      traZODone 50 MG tablet   Commonly known as: DESYREL         STOP taking these medications         BD DISP NEEDLES 27G X 1/2" Misc          Where to get your medications    These are the prescriptions that you need to pick up. We sent them to a specific pharmacy, so you will need to go there to get them.   [place] [id number]    Phone: [phone]        aspirin 81 MG EC tablet   furosemide 40 MG tablet   glucose blood test strip   insulin aspart 100 UNIT/ML injection   insulin glargine 100 UNIT/ML injection   Insulin Pen Needle 29G X 12.7MM Misc   losartan 50 MG tablet   pravastatin 80 MG tablet   sildenafil 100 MG tablet         You may get these medications from any pharmacy.         glucose monitoring kit monitoring kit            Pertinent Hospital Labs  Lab 08/21/11 0614 08/20/11 0915 08/19/11 1725  NA 132* 133* 139  K 3.6 3.4* 3.8  CL 92* 95* 97  CO2 27 28 29   BUN 13 26* 44*  CREATININE 0.91 1.19 1.90*  LABGLOM -- -- --  GLUCOSE 252* -- --  CALCIUM 8.7 8.5 8.6    Lab 08/18/11 2238 08/18/11 1610  WBC 15.4* 17.8*  HGB 13.2 14.6  HCT 40.6 46.0  PLT 258 268    Discharge instructions:  see AVS  Condition at  discharge: stable  Disposition: home with follow at [facility]  Pending Tests: none  Follow up: Follow-up Information    Follow up with [facility], Levada Dy, MD on 08/28/2011. (at 10:45am)    Contact information:   [place] [phone]          Follow up Issues:  -recheck Bmet as Na was still low at discharge -follow up blood sugars - he was instructed to record his sugars and bring log to appointment -make sure he has been able to get the Novolog flexpen -Titrate lantus as needed

## 2011-08-22 NOTE — Discharge Summary (Signed)
I interviewed and examined this patient and discussed the care plan with Dr. Darrick Grinder and the [facility] team and agree with assessment and plan as documented in the discharge note for today.Jeffrey Jupiter A. Walker Kehr, MD Family Medicine Teaching Service Attending  08/22/2011 8:30 AM

## 2011-08-28 ENCOUNTER — Other Ambulatory Visit: Payer: Self-pay | Admitting: Emergency Medicine

## 2011-08-28 ENCOUNTER — Inpatient Hospital Stay: Payer: BC Managed Care – PPO | Admitting: Family Medicine

## 2011-08-28 MED ORDER — GLUCOSE BLOOD VI STRP: ORAL_STRIP | Status: AC

## 2011-09-02 ENCOUNTER — Ambulatory Visit (INDEPENDENT_AMBULATORY_CARE_PROVIDER_SITE_OTHER): Payer: BC Managed Care – PPO | Admitting: Family Medicine

## 2011-09-02 ENCOUNTER — Encounter: Payer: Self-pay | Admitting: Family Medicine

## 2011-09-02 VITALS — BP 134/86 | HR 80 | Temp 98.4°F | Ht 70.0 in | Wt 260.6 lb

## 2011-09-02 DIAGNOSIS — I10 Essential (primary) hypertension: Secondary | ICD-10-CM

## 2011-09-02 DIAGNOSIS — E119 Type 2 diabetes mellitus without complications: Secondary | ICD-10-CM

## 2011-09-02 LAB — BASIC METABOLIC PANEL
CO2: 31 mEq/L (ref 19–32)
Calcium: 10.6 mg/dL — ABNORMAL HIGH (ref 8.4–10.5)
Chloride: 89 mEq/L — ABNORMAL LOW (ref 96–112)
Glucose, Bld: 365 mg/dL — ABNORMAL HIGH (ref 70–99)
Sodium: 131 mEq/L — ABNORMAL LOW (ref 135–145)

## 2011-09-02 NOTE — Patient Instructions (Signed)
For your diabetes:   -increase your Lantus to 75 units daily   -continue the sliding scale  For your blood pressure: no new changes, continue the Lasix and Cozaar  Follow-up your diabetes and blood pressure in 1 month.  Make an appointment to see a doctor about your shoulder.

## 2011-09-02 NOTE — Assessment & Plan Note (Signed)
Better controlled and now fairly well controlled. Compliant with medications. Brought good record of blood pressures. Continue current medications. Will check K, Cr today since recently started Cozaar during hospitalization and is on Lasix.

## 2011-09-02 NOTE — Assessment & Plan Note (Signed)
Better controlled based on his records. Will increase Lantus from 70 to 75 units since using Novolog 5-10 units bid wc. Continue SSI. Follow-up in 1 month.  Patient needing paperwork filled out for short-term disability for hospitalization 11/11-11/14/2012 for DKA. [place] already sent forms to providers. I have asked him to check, and if this hasn't been told, told him I will fill out forms for him.

## 2011-09-02 NOTE — Progress Notes (Signed)
  Subjective:    Patient ID: Jeffrey [name], male    DOB: 11-Mar-1966, 45 y.o.   MRN: [id number]  HPI Patient here for hospital follow-up where he was admitted 11/11-11/14 for DKA.  1. Diabetes Advised to take 35 Lantus at discharge but sugars elevated at home in 400s so went back closer to previous home dose (76 units) and has been taking 70 units. Using SSI twice a day with his meals. Taking about 5-10 units Novolog twice daily with meals.   2. HTN Has been recording blood pressures twice daily. Has been 80-180s/40-99. Usually 120-140s/80s. 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 Compliant with Norvasc, Lasix, Cozaar, and metoprolol. ROS: denies chest pain, difficulty breathing  Review of Systems Complaining of right shoulder pain with movement.    Objective:   Physical Exam Gen: NAD Psych: engaged, appropriate CV: RRR, no m/r/g Pulm: CTAB, no w/r/r Ext: no edema    Assessment & Plan:

## 2011-09-16 ENCOUNTER — Other Ambulatory Visit: Payer: Self-pay | Admitting: Sports Medicine

## 2011-09-16 DIAGNOSIS — I10 Essential (primary) hypertension: Secondary | ICD-10-CM

## 2011-09-16 MED ORDER — METOPROLOL SUCCINATE ER 100 MG PO TB24: 100.0000 mg | ORAL_TABLET | Freq: Two times a day (BID) | ORAL | Status: DC

## 2011-10-09 ENCOUNTER — Other Ambulatory Visit: Payer: Self-pay | Admitting: Sports Medicine

## 2011-10-10 NOTE — Telephone Encounter (Signed)
Refill request

## 2011-10-14 ENCOUNTER — Other Ambulatory Visit: Payer: Self-pay | Admitting: Sports Medicine

## 2011-10-15 NOTE — Telephone Encounter (Signed)
Refill request

## 2011-11-26 ENCOUNTER — Ambulatory Visit (INDEPENDENT_AMBULATORY_CARE_PROVIDER_SITE_OTHER): Payer: BC Managed Care – PPO | Admitting: Sports Medicine

## 2011-11-26 ENCOUNTER — Encounter: Payer: Self-pay | Admitting: Sports Medicine

## 2011-11-26 VITALS — BP 170/110 | HR 71 | Temp 98.0°F | Ht 70.0 in | Wt 275.0 lb

## 2011-11-26 DIAGNOSIS — S81802A Unspecified open wound, left lower leg, initial encounter: Secondary | ICD-10-CM

## 2011-11-26 DIAGNOSIS — E119 Type 2 diabetes mellitus without complications: Secondary | ICD-10-CM

## 2011-11-26 DIAGNOSIS — G4726 Circadian rhythm sleep disorder, shift work type: Secondary | ICD-10-CM

## 2011-11-26 DIAGNOSIS — S81809A Unspecified open wound, unspecified lower leg, initial encounter: Secondary | ICD-10-CM

## 2011-11-26 DIAGNOSIS — I10 Essential (primary) hypertension: Secondary | ICD-10-CM

## 2011-11-26 MED ORDER — LOSARTAN POTASSIUM 50 MG PO TABS: 50.0000 mg | ORAL_TABLET | Freq: Every day | ORAL | Status: DC

## 2011-11-26 MED ORDER — PRAVASTATIN SODIUM 80 MG PO TABS: 80.0000 mg | ORAL_TABLET | Freq: Every day | ORAL | Status: DC

## 2011-11-26 MED ORDER — TRAZODONE HCL 50 MG PO TABS
50.0000 mg | ORAL_TABLET | Freq: Every evening | ORAL | Status: DC | PRN
Start: 2011-11-26 — End: 2012-02-17

## 2011-11-26 MED ORDER — FUROSEMIDE 40 MG PO TABS: 20.0000 mg | ORAL_TABLET | Freq: Every day | ORAL | Status: DC

## 2011-11-26 MED ORDER — SILDENAFIL CITRATE 100 MG PO TABS: 100.0000 mg | ORAL_TABLET | ORAL | Status: DC | PRN

## 2011-11-26 MED ORDER — AMLODIPINE BESYLATE 5 MG PO TABS: 5.0000 mg | ORAL_TABLET | Freq: Every day | ORAL | Status: DC

## 2011-11-26 MED ORDER — METOPROLOL SUCCINATE ER 100 MG PO TB24: 100.0000 mg | ORAL_TABLET | Freq: Two times a day (BID) | ORAL | Status: DC

## 2011-11-26 NOTE — Patient Instructions (Addendum)
It was nice to see you today.  We discussed a lot today.  Here are the highlights:  We are referring you to get a sleep study for evaluation of sleep apnea.  I feel that your overall health may benefit from fixing your underlying sleep issues.  You will either hear from [name] or the other office regarding an appointment.  If there are any issues please be in touch so we can make sure this happens.  I have sent in new prescriptions to [facility] for your 90 day supplies of medications.  For your leg wound I want you try to keep the area covered until you see me at the beginning of next week. Take a tube sock and cut the ends of it off an keep it covered to prevent it from falling off.  If it does come off between now and then that is fine but replace it with a piece of TegaDerm to cover the area.  You can get this at a medical supply pharmacy (ask the pharmacist)   Please come back to see me at the beginning of next week.  If you have any issues please call our office.

## 2011-12-02 ENCOUNTER — Encounter: Payer: Self-pay | Admitting: Sports Medicine

## 2011-12-02 ENCOUNTER — Ambulatory Visit (INDEPENDENT_AMBULATORY_CARE_PROVIDER_SITE_OTHER): Payer: BC Managed Care – PPO | Admitting: Sports Medicine

## 2011-12-02 DIAGNOSIS — G4726 Circadian rhythm sleep disorder, shift work type: Secondary | ICD-10-CM

## 2011-12-02 DIAGNOSIS — G4700 Insomnia, unspecified: Secondary | ICD-10-CM

## 2011-12-02 DIAGNOSIS — S81802A Unspecified open wound, left lower leg, initial encounter: Secondary | ICD-10-CM

## 2011-12-02 DIAGNOSIS — S81009A Unspecified open wound, unspecified knee, initial encounter: Secondary | ICD-10-CM

## 2011-12-02 DIAGNOSIS — I872 Venous insufficiency (chronic) (peripheral): Secondary | ICD-10-CM

## 2011-12-02 DIAGNOSIS — E119 Type 2 diabetes mellitus without complications: Secondary | ICD-10-CM

## 2011-12-02 MED ORDER — "TEGADERM + PAD 3-1/2""X4"" MISC": Status: DC

## 2011-12-02 MED ORDER — TUBULAR GAUZE MISC: Status: DC

## 2011-12-02 NOTE — Patient Instructions (Signed)
It was great to see you today.  Your leg is healing up nicely.  I want you to continue to keep the area covered as long as possible with this patch I placed today.  After it comes off you can replace it with Tegaderm (or generic Opsite).  Clean the area with soap and water between placing patches.  I would also suggest you keep the bandages covered with a tube sock/gauze or while at work with the compression stockings.    Try putting on your compression stockings first thing in the morning before your feet hit the floor.  This should help with your swelling and should make your legs feel better by the end of the day, not worse.   Please plan to come back to see me in 2-3 weeks to discuss your blood pressures, diabetes and to check on your wound.

## 2011-12-03 DIAGNOSIS — S81802A Unspecified open wound, left lower leg, initial encounter: Secondary | ICD-10-CM | POA: Insufficient documentation

## 2011-12-03 NOTE — Assessment & Plan Note (Signed)
Consider sleep apnea - pt reports poor sleep quality and has multiple co morbidities that have been difficult to control with typical medications.  Will refer to sleep study; pt agreeable at time of meeting

## 2011-12-03 NOTE — Assessment & Plan Note (Signed)
Will refill BP meds; no adjustments at this time; pt reports labile BP as well as BS

## 2011-12-03 NOTE — Assessment & Plan Note (Signed)
Venous Stasis Ulcer Pt just brought to our attention today.  Reports frequent foot checks.  This wound has been present since June and is not associated with any trauma.  Does report that with LE edema worsened; has never completely healed but has looked better.  Will dress today and have recheck in 5 days; provided rx for compression hose and instructed to put on before getting out of bed in AM.    Discussed risks of this type of wound and pt was understanding (tearfully)

## 2011-12-03 NOTE — Assessment & Plan Note (Signed)
Pt reports labile BS with current regimen; A1C not at goal but is improving.  Consider increased Lantus Dose if pt able to schedule meals as reports hypoglycemia 2o/2 poor meal planning

## 2011-12-03 NOTE — Progress Notes (Signed)
Subjective:  Jeffrey [name] is a 46 y.o. male presenting today for DM and BP followup.  He reports better compliance with his diabetes regimen; however has been out of BP meds for ~1 week.  Occasional hypoglycemia associated with skipping meals.  No syncopal or neal syncopal events.    Has noticed occasional hypertension episodes at work that he will become dizzy and need to rest; sx resolve with rest and are usually brought on by exertion (i.e walking up steps)  No foot wounds but does report wound on L outer aspect of leg; present since June 2012.  Waxing and [place], no exudate, no ulceration, incompletely heals; worse when LE edema is worse; denies trying any home tx  ROS  Constitutional No change in energy level; reports poor sleep with frequent snoring and apenic spells, does not wake up rested; reports LE swelling when on feet at work; swelling goes down when comes home  Infectious No fevers, no chills, no sick contacts  Resp No cough, no congestion  Cardiac No CP, no orthopnea  Diet Reports having issues with maintaining a normal meal schedule  MEDS Had been compliant until ran out of meds    Past Medical Hx Reviewed: yes Medications Reviewed: yes Family History Reviewed: yes   PE: GENERAL:  Adult, AA, male Examined in [place], NAD, in no resp distress, appears fatigued HNEENT: AT/[place], MMM, no scleral icterus, EOMi; wide based neck THORAX: HEART: RRR, S1/S2 heard, no murmur LUNGS: CTA B, no wheezes, no crackles EXTREMITIES: L LE with superficial chronic venous stasis ulcer; no exduate, no erythema

## 2011-12-13 ENCOUNTER — Encounter: Payer: Self-pay | Admitting: Sports Medicine

## 2011-12-13 DIAGNOSIS — I872 Venous insufficiency (chronic) (peripheral): Secondary | ICD-10-CM

## 2011-12-13 HISTORY — DX: Venous insufficiency (chronic) (peripheral): I87.2

## 2011-12-13 NOTE — Assessment & Plan Note (Addendum)
Try compression stockings however was not putting him on prior to bed. Patient is complaining of pain. Will trial first thing when waking, patient may tolerate lower compression stockings if he is unable to adjust these.

## 2011-12-13 NOTE — Progress Notes (Signed)
HPI:  Jeffrey [name] is a 46 y.o. male presenting today for reevaluation of his left lateral shin wound. He states that the bandage fell off 2 days prior to reevaluation. He has been keeping the area covered. He was able to pickup compression socks however are causing a lot of discomfort -  he has not been putting them on first thing in the morning.   Patient does also report sleep apnea-like symptoms. Reports snoring, waking up gasping for air, reportedly has spells per his wife. Has excessive daytime sleepiness.  ROS Denies and chest pain, shortness breath, leg pain except for when wearing compression socks.  Has been taking his medications on a regular basis   Past Medical Hx Reviewed: yes Medications Reviewed: yes Family History Reviewed: yes   PE: GENERAL:  Adult African American male examined in [place]. Alert and verbally interactive HNEENT: AT/[place], MMM, no scleral icterus, EOMi THORAX: HEART: RRR, S1/S2 heard, no murmur LUNGS: CTA B, no wheezes, no crackles EXTREMITIES: Left lateral shin wound, healing well with 3 cm square area of slight induration however no erythema. Postinflammatory hyperpigmentation present, with epithelialized but continuing to heal skin

## 2011-12-13 NOTE — Assessment & Plan Note (Signed)
Consider Epworth Sleepiness Scale at next visit

## 2011-12-13 NOTE — Assessment & Plan Note (Addendum)
Contributing to this wound ulcer, patient was encouraged again to continue following medication regimen previously established

## 2011-12-13 NOTE — Assessment & Plan Note (Signed)
Improving. Patient will likely benefit from long-term compression hose stockings for venous insufficiency especially given his line of work. We'll continue to keep covered at this time to allow healing please see ABS for further wound care instructions.

## 2011-12-24 ENCOUNTER — Ambulatory Visit (HOSPITAL_BASED_OUTPATIENT_CLINIC_OR_DEPARTMENT_OTHER): Payer: BC Managed Care – PPO

## 2012-02-17 ENCOUNTER — Other Ambulatory Visit: Payer: Self-pay | Admitting: Sports Medicine

## 2012-08-31 ENCOUNTER — Ambulatory Visit: Payer: BC Managed Care – PPO | Admitting: Family Medicine

## 2012-10-07 DIAGNOSIS — K284 Chronic or unspecified gastrojejunal ulcer with hemorrhage: Secondary | ICD-10-CM

## 2012-10-07 DIAGNOSIS — IMO0001 Reserved for inherently not codable concepts without codable children: Secondary | ICD-10-CM

## 2012-10-07 DIAGNOSIS — K274 Chronic or unspecified peptic ulcer, site unspecified, with hemorrhage: Secondary | ICD-10-CM

## 2012-10-07 DIAGNOSIS — Z5189 Encounter for other specified aftercare: Secondary | ICD-10-CM

## 2012-10-07 HISTORY — DX: Reserved for inherently not codable concepts without codable children: IMO0001

## 2012-10-07 HISTORY — DX: Encounter for other specified aftercare: Z51.89

## 2012-10-07 HISTORY — DX: Chronic or unspecified gastrojejunal ulcer with hemorrhage: K28.4

## 2012-10-07 HISTORY — DX: Chronic or unspecified peptic ulcer, site unspecified, with hemorrhage: K27.4

## 2012-10-12 ENCOUNTER — Other Ambulatory Visit: Payer: Self-pay | Admitting: Sports Medicine

## 2012-10-29 ENCOUNTER — Other Ambulatory Visit: Payer: Self-pay | Admitting: Sports Medicine

## 2012-11-25 ENCOUNTER — Encounter (HOSPITAL_COMMUNITY): Payer: Self-pay | Admitting: *Deleted

## 2012-11-25 ENCOUNTER — Inpatient Hospital Stay (HOSPITAL_COMMUNITY)
Admission: EM | Admit: 2012-11-25 | Discharge: 2012-11-28 | DRG: 378 | Disposition: A | Discharge status: Home / Self Care | Payer: Managed Care, Other (non HMO) | Attending: Family Medicine | Admitting: Family Medicine

## 2012-11-25 DIAGNOSIS — F1021 Alcohol dependence, in remission: Secondary | ICD-10-CM

## 2012-11-25 DIAGNOSIS — F528 Other sexual dysfunction not due to a substance or known physiological condition: Secondary | ICD-10-CM

## 2012-11-25 DIAGNOSIS — D500 Iron deficiency anemia secondary to blood loss (chronic): Secondary | ICD-10-CM

## 2012-11-25 DIAGNOSIS — E119 Type 2 diabetes mellitus without complications: Secondary | ICD-10-CM | POA: Diagnosis present

## 2012-11-25 DIAGNOSIS — K449 Diaphragmatic hernia without obstruction or gangrene: Secondary | ICD-10-CM | POA: Diagnosis present

## 2012-11-25 DIAGNOSIS — N289 Disorder of kidney and ureter, unspecified: Secondary | ICD-10-CM

## 2012-11-25 DIAGNOSIS — Z7982 Long term (current) use of aspirin: Secondary | ICD-10-CM

## 2012-11-25 DIAGNOSIS — Z79899 Other long term (current) drug therapy: Secondary | ICD-10-CM

## 2012-11-25 DIAGNOSIS — I951 Orthostatic hypotension: Secondary | ICD-10-CM | POA: Diagnosis present

## 2012-11-25 DIAGNOSIS — E669 Obesity, unspecified: Secondary | ICD-10-CM | POA: Diagnosis present

## 2012-11-25 DIAGNOSIS — M129 Arthropathy, unspecified: Secondary | ICD-10-CM | POA: Diagnosis present

## 2012-11-25 DIAGNOSIS — D62 Acute posthemorrhagic anemia: Secondary | ICD-10-CM | POA: Diagnosis present

## 2012-11-25 DIAGNOSIS — F411 Generalized anxiety disorder: Secondary | ICD-10-CM | POA: Diagnosis present

## 2012-11-25 DIAGNOSIS — G4726 Circadian rhythm sleep disorder, shift work type: Secondary | ICD-10-CM

## 2012-11-25 DIAGNOSIS — E785 Hyperlipidemia, unspecified: Secondary | ICD-10-CM | POA: Diagnosis present

## 2012-11-25 DIAGNOSIS — K922 Gastrointestinal hemorrhage, unspecified: Secondary | ICD-10-CM | POA: Diagnosis present

## 2012-11-25 DIAGNOSIS — E1140 Type 2 diabetes mellitus with diabetic neuropathy, unspecified: Secondary | ICD-10-CM | POA: Diagnosis present

## 2012-11-25 DIAGNOSIS — Z794 Long term (current) use of insulin: Secondary | ICD-10-CM | POA: Diagnosis present

## 2012-11-25 DIAGNOSIS — I872 Venous insufficiency (chronic) (peripheral): Secondary | ICD-10-CM | POA: Diagnosis present

## 2012-11-25 DIAGNOSIS — F1010 Alcohol abuse, uncomplicated: Secondary | ICD-10-CM | POA: Diagnosis present

## 2012-11-25 DIAGNOSIS — E7800 Pure hypercholesterolemia, unspecified: Secondary | ICD-10-CM

## 2012-11-25 DIAGNOSIS — K254 Chronic or unspecified gastric ulcer with hemorrhage: Principal | ICD-10-CM | POA: Diagnosis present

## 2012-11-25 DIAGNOSIS — I10 Essential (primary) hypertension: Secondary | ICD-10-CM | POA: Diagnosis present

## 2012-11-25 LAB — PROTIME-INR: INR: 1.13 (ref 0.00–1.49)

## 2012-11-25 LAB — URINALYSIS, ROUTINE W REFLEX MICROSCOPIC
Bilirubin Urine: NEGATIVE
Hgb urine dipstick: NEGATIVE
Protein, ur: NEGATIVE mg/dL
Urobilinogen, UA: 0.2 mg/dL (ref 0.0–1.0)

## 2012-11-25 LAB — OCCULT BLOOD, POC DEVICE: Fecal Occult Bld: POSITIVE — AB

## 2012-11-25 LAB — COMPREHENSIVE METABOLIC PANEL
BUN: 30 mg/dL — ABNORMAL HIGH (ref 6–23)
CO2: 22 mEq/L (ref 19–32)
Calcium: 8.7 mg/dL (ref 8.4–10.5)
Creatinine, Ser: 1.31 mg/dL (ref 0.50–1.35)
GFR calc Af Amer: 73 mL/min — ABNORMAL LOW (ref >90)
GFR calc non Af Amer: 63 mL/min — ABNORMAL LOW (ref >90)
Glucose, Bld: 331 mg/dL — ABNORMAL HIGH (ref 70–99)
Sodium: 134 mEq/L — ABNORMAL LOW (ref 135–145)
Total Protein: 7.2 g/dL (ref 6.0–8.3)

## 2012-11-25 LAB — CBC
HCT: 16.7 % — ABNORMAL LOW (ref 39.0–52.0)
Hemoglobin: 5.7 g/dL — CL (ref 13.0–17.0)
MCH: 32.2 pg (ref 26.0–34.0)
MCHC: 34.1 g/dL (ref 30.0–36.0)
MCV: 94.4 fL (ref 78.0–100.0)

## 2012-11-25 LAB — GLUCOSE, CAPILLARY: Glucose-Capillary: 243 mg/dL — ABNORMAL HIGH (ref 70–99)

## 2012-11-25 LAB — PREPARE RBC (CROSSMATCH)

## 2012-11-25 MED ORDER — LORAZEPAM 1 MG PO TABS
1.0000 mg | ORAL_TABLET | Freq: Four times a day (QID) | ORAL | Status: DC | PRN
  Administered 2012-11-28: 1 mg via ORAL
  Filled 2012-11-25: qty 1

## 2012-11-25 MED ORDER — INSULIN ASPART 100 UNIT/ML ~~LOC~~ SOLN
0.0000 [IU] | Freq: Three times a day (TID) | SUBCUTANEOUS | Status: DC
  Administered 2012-11-26: 2 [IU] via SUBCUTANEOUS
  Administered 2012-11-27: 5 [IU] via SUBCUTANEOUS
  Administered 2012-11-27 (×2): 3 [IU] via SUBCUTANEOUS
  Administered 2012-11-28: 2 [IU] via SUBCUTANEOUS
  Administered 2012-11-28: 3 [IU] via SUBCUTANEOUS
  Filled 2012-11-25: qty 1

## 2012-11-25 MED ORDER — LORAZEPAM 2 MG/ML IJ SOLN: 1.0000 mg | Freq: Four times a day (QID) | INTRAMUSCULAR | Status: DC | PRN

## 2012-11-25 MED ORDER — ACETAMINOPHEN 650 MG RE SUPP: 650.0000 mg | Freq: Four times a day (QID) | RECTAL | Status: DC | PRN

## 2012-11-25 MED ORDER — SODIUM CHLORIDE 0.9 % IJ SOLN
3.0000 mL | Freq: Two times a day (BID) | INTRAMUSCULAR | Status: DC
  Administered 2012-11-25 – 2012-11-28 (×3): 3 mL via INTRAVENOUS

## 2012-11-25 MED ORDER — SODIUM CHLORIDE 0.9 % IV SOLN
80.0000 mg | Freq: Once | INTRAVENOUS | Status: AC
  Administered 2012-11-25: 80 mg via INTRAVENOUS
  Filled 2012-11-25: qty 80

## 2012-11-25 MED ORDER — FOLIC ACID 1 MG PO TABS
1.0000 mg | ORAL_TABLET | Freq: Every day | ORAL | Status: DC
  Administered 2012-11-25 – 2012-11-28 (×4): 1 mg via ORAL
  Filled 2012-11-25 (×4): qty 1

## 2012-11-25 MED ORDER — ADULT MULTIVITAMIN W/MINERALS CH
1.0000 | ORAL_TABLET | Freq: Every day | ORAL | Status: DC
  Administered 2012-11-25 – 2012-11-28 (×4): 1 via ORAL
  Filled 2012-11-25 (×4): qty 1

## 2012-11-25 MED ORDER — SIMVASTATIN 20 MG PO TABS
20.0000 mg | ORAL_TABLET | Freq: Every day | ORAL | Status: DC
  Administered 2012-11-26 – 2012-11-27 (×2): 20 mg via ORAL
  Filled 2012-11-25 (×3): qty 1

## 2012-11-25 MED ORDER — SODIUM CHLORIDE 0.9 % IV SOLN
INTRAVENOUS | Status: DC
  Administered 2012-11-25 – 2012-11-27 (×3): via INTRAVENOUS

## 2012-11-25 MED ORDER — THIAMINE HCL 100 MG/ML IJ SOLN
100.0000 mg | Freq: Every day | INTRAMUSCULAR | Status: DC
  Filled 2012-11-25: qty 1
  Filled 2012-11-25: qty 2
  Filled 2012-11-25 (×2): qty 1

## 2012-11-25 MED ORDER — INSULIN ASPART 100 UNIT/ML ~~LOC~~ SOLN
0.0000 [IU] | Freq: Every day | SUBCUTANEOUS | Status: DC
  Administered 2012-11-25: 2 [IU] via SUBCUTANEOUS
  Administered 2012-11-26: 5 [IU] via SUBCUTANEOUS
  Administered 2012-11-27: 3 [IU] via SUBCUTANEOUS

## 2012-11-25 MED ORDER — SODIUM CHLORIDE 0.9 % IV SOLN
8.0000 mg/h | INTRAVENOUS | Status: DC
  Administered 2012-11-25 – 2012-11-26 (×2): 8 mg/h via INTRAVENOUS
  Filled 2012-11-25 (×7): qty 80

## 2012-11-25 MED ORDER — TRAZODONE HCL 50 MG PO TABS
50.0000 mg | ORAL_TABLET | Freq: Every day | ORAL | Status: DC
  Administered 2012-11-25 – 2012-11-27 (×3): 50 mg via ORAL
  Filled 2012-11-25 (×5): qty 1

## 2012-11-25 MED ORDER — ACETAMINOPHEN 325 MG PO TABS: 650.0000 mg | ORAL_TABLET | Freq: Four times a day (QID) | ORAL | Status: DC | PRN

## 2012-11-25 MED ORDER — VITAMIN B-1 100 MG PO TABS
100.0000 mg | ORAL_TABLET | Freq: Every day | ORAL | Status: DC
  Administered 2012-11-25 – 2012-11-28 (×4): 100 mg via ORAL
  Filled 2012-11-25 (×4): qty 1

## 2012-11-25 MED ORDER — INSULIN GLARGINE 100 UNIT/ML ~~LOC~~ SOLN
35.0000 [IU] | Freq: Every day | SUBCUTANEOUS | Status: DC
  Administered 2012-11-25 – 2012-11-27 (×3): 35 [IU] via SUBCUTANEOUS
  Filled 2012-11-25: qty 1

## 2012-11-25 NOTE — H&P (Signed)
[facility] Admission History and Physical Service Pager: [phone]  Patient name: Jeffrey [name] Medical record number: [id number] Date of birth: 06/03/1966 Age: 47 y.o. Gender: male  Primary Care Provider: Teresa Coombs, DO  Chief Complaint: Melena and Hematemesis   Assessment and Plan: Jeffrey [name] is a 47 y.o. year old male presenting with coffee ground emesis 1. Upper GI Bleed - Pt NPO and has not eaten in three days and having melena and coffee ground emesis x 1 last PM.  Also c/o postural hypotension Sx including dizziness/lightheadedness 1. Continue Protonix drip 2. Hgb on admission 5.7.  Tranfuse 2 uPRBC and will get postranfusion CBC.  Also will get CBC q 4 hrs after that.  Will get EKG in AM and admit to telemetry with vitals per Step Down Unit. 3. Consult to GI, greatly appreciate recommendations.  They will evaluate the patient in the AM and perform endoscopy.   2. Diabetes Mellitus II - On Lantus 70 U at home.  Will give him 35 U since he his NPO currently. 1. SSI along with CBG checks with meals 2. QHS insulin 3. NPO, no mealtime coverage at this time 3. Hyperlipidemia - Zocor while in hospital.  Resume home Pravachol upon d/c. 4. HTN - BP on admission [id number] systolic.  1. Holding BP medications until GI bleed identified 5. Anxiety - Continue home Trazadone 6. Alcohol Abuse - CSW consult 1. CIWA to prevent withdrawal  7. FEN/GI: Protonix GTT, NPO 8. Prophylaxis: TED hose per active bleed 9. Disposition: SDU pending GI recommednations 10. Code Status: Full   History of Present Illness: Jeffrey [name] is a 47 y.o. year old male presenting with blood in his stool and one episode of coffee ground emesis x 1 for the past two days.  Pt has heavy alcohol abuse history and has been treated for an upper GI bleed treated in 2009.    Pt states he was in his normal state of health up until two days ago when he started to have dark stool and some  abdominal pain.  He proceeded to have one episode of coffee ground emesis last night and has felt nauseated since then but no vomiting.  Pt attempted to go to work today and had dizziness and lightheadedness when at work.  He states he went to go to the bathroom and had dark colored stool with blood in it but no bright red blood per rectum at that time.  At this point he decided to come to the ED.  In the ED, pt had multiple labs done including a BMP which showed a BUN of 30, a CBC showing a Hgb of 5.7, an INR 1.13 and he was type and crossed due to his acute blood loss anemia.  Pt received 2 u PRBC in the ED and was started on a Protonix drip.    He denies CP, SOB, current lightheadedness or blurred vision, abdominal pain, swelling in his legs.   Pt denies smoking or illicit drugs.  He states he drinks one beer per night and occasionally more on the weekend.  He has been doing this for over 20 years.   Patient Active Problem List  Diagnosis  . DIABETES MELLITUS II, UNCOMPLICATED  . HYPERCHOLESTEROLEMIA  . OBESITY, NOS  . ANXIETY  . ERECTILE DYSFUNCTION  . INSOMNIA, CHRONIC  . 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 HYPERTENSION, BENIGN SYSTEMIC  . ARTHRITIS  . ALCOHOL ABUSE, HX OF  .  Leg wound, left  . Venous insufficiency   Past Medical History: Past Medical History  Diagnosis Date  . Hypertension   . Blood transfusion   . Diabetes mellitus   . Venous insufficiency 12/13/2011   Past Surgical History: Past Surgical History  Procedure Laterality Date  . Knee arthroscopy     Social History: History  Substance Use Topics  . Smoking status: Never Smoker   . Smokeless tobacco: Not on file  . Alcohol Use: 8.4 oz/week    14 Cans of beer per week     Comment: couple cans day   For any additional social history documentation, please refer to relevant sections of EMR.  Family History: No family history on file. Allergies: Allergies  Allergen Reactions  . Lisinopril      Pt reports nose bleed.   No current facility-administered medications on file prior to encounter.   Current Outpatient Prescriptions on File Prior to Encounter  Medication Sig Dispense Refill  . amLODipine (NORVASC) 5 MG tablet Take 1 tablet (5 mg total) by mouth daily.  90 tablet  3  . aspirin (ADULT ASPIRIN EC LOW STRENGTH) 81 MG EC tablet Take 1 tablet (81 mg total) by mouth daily.  30 tablet  0  . furosemide (LASIX) 40 MG tablet Take 0.5 tablets (20 mg total) by mouth daily.  90 tablet  3  . insulin aspart (NOVOLOG FLEXPEN) 100 UNIT/ML injection Inject 5-10 units before meals as directed by your sliding scale.  1 vial  12  . losartan (COZAAR) 50 MG tablet Take 1 tablet (50 mg total) by mouth daily.  90 tablet  3  . pravastatin (PRAVACHOL) 80 MG tablet Take 1 tablet (80 mg total) by mouth at bedtime. for Cholesterol  90 tablet  3  . sildenafil (VIAGRA) 100 MG tablet Take 1 tablet (100 mg total) by mouth as needed for erectile dysfunction. Take 1 tablet 30 minutes to 4 hours before intercourse  30 tablet  3   Review Of Systems: Per HPI with the following additions: None Otherwise 12 point review of systems was performed and was unremarkable.  Physical Exam: BP 123/71  Pulse 87  Temp(Src) 99.5 F (37.5 C) (Oral)  Resp 16  SpO2 100% Exam: General: NAD HEENT: [place]/AT, Pale conjunctiva, Dry MM Cardiovascular: RRR, 2/6 flow murmur Respiratory: CTA, no wheezes Abdomen: Soft, NT/ND, no HSM, no epigastric tenderness Extremities: No edema Skin: Dry, intact Neuro: AAO x3, no focal deficits   Labs and Imaging: CBC BMET   Recent Labs Lab 11/25/12 1650  WBC 11.8*  HGB 5.7*  HCT 16.7*  PLT 351    Recent Labs Lab 11/25/12 1650  NA 134*  K 4.3  CL 94*  CO2 22  BUN 30*  CREATININE 1.31  GLUCOSE 331*  CALCIUM 8.7    Bryan R. Awanda Mink, DO of [name] 11/25/2012, 9:12 PM  PGY-3 Addendum  Seen and evaluated along with Dr. Awanda Mink.  I agree with above exam,  assessment and plan at outlined by Dr. Awanda Mink with and additions in Red

## 2012-11-25 NOTE — ED Notes (Signed)
Pt to ED c/o blood in stool and abd cramping.  Pt noticed stool was bloody yesterday, but today noticed blood coming out along with completely red stool.  Also c/o black coffe-grounds emesis x 1 last night.  Pt feels dizzy and week. Hx of blood transfusion r/t rectal bleed in the past.

## 2012-11-25 NOTE — ED Provider Notes (Signed)
I saw and evaluated the patient, reviewed the resident's note and I agree with the findings and plan.   Jeffrey Rice, MD 11/25/12 2328

## 2012-11-25 NOTE — ED Provider Notes (Signed)
History     CSN: [id number]  Arrival date & time 11/25/12  1636   First MD Initiated Contact with Patient 11/25/12 1700      Chief Complaint  Patient presents with  . Rectal Bleeding   HPI Jeffrey [name] is a 47 y.o. male who presents to the ED for concern of rectal bleeding.  Patient reports that over the last three days he has had several episodes of tarry stools.  Approx 1-2/day.  Then this AM had coffee ground emesis.  Medium volume.  One episode.  Felt like he should be checked out.  Reports one episode of this in 2009, but is unclear on details.  Says that this required transfusion in past.  No fevers.  No abdominal pain.  No BRB in vomit or in stool.  No other symptoms.  Past Medical History  Diagnosis Date  . Hypertension   . Blood transfusion   . Diabetes mellitus   . Venous insufficiency 12/13/2011    Past Surgical History  Procedure Laterality Date  . Knee arthroscopy     Family History: Reviewed.  None pertinent.    History  Substance Use Topics  . Smoking status: Never Smoker   . Smokeless tobacco: Not on file  . Alcohol Use: 8.4 oz/week    14 Cans of beer per week     Comment: couple cans day      Review of Systems  Constitutional: Negative for fever and chills.  HENT: Negative for congestion, sore throat and neck pain.   Respiratory: Negative for cough.   Gastrointestinal: Positive for vomiting and blood in stool. Negative for nausea, abdominal pain, diarrhea and constipation.  Endocrine: Negative for polyuria.  Genitourinary: Negative for dysuria and hematuria.  Skin: Negative for rash.  Neurological: Negative for headaches.  Psychiatric/Behavioral: Negative.   All other systems reviewed and are negative.    Allergies  Lisinopril  Home Medications   Current Outpatient Rx  Name  Route  Sig  Dispense  Refill  . amLODipine (NORVASC) 5 MG tablet   Oral   Take 1 tablet (5 mg total) by mouth daily.   90 tablet   3   . aspirin (ADULT ASPIRIN  EC LOW STRENGTH) 81 MG EC tablet   Oral   Take 1 tablet (81 mg total) by mouth daily.   30 tablet   0   . furosemide (LASIX) 40 MG tablet   Oral   Take 0.5 tablets (20 mg total) by mouth daily.   90 tablet   3   . Gauze Bandages (TUBULAR GAUZE) MISC      Pt to apply to lower leg over wound dressing.  Dispense qs   1 each   0   . insulin aspart (NOVOLOG FLEXPEN) 100 UNIT/ML injection      Inject 5-10 units before meals as directed by your sliding scale.   1 vial   12   . Insulin Pen Needle (BD ULTRA-FINE PEN NEEDLES) 29G X 12.7MM MISC   Subcutaneous   Inject 1 Device into the skin 3 (three) times daily before meals.   200 each   2   . LANTUS SOLOSTAR 100 UNIT/ML injection      INJECT 35 UNITS UNDER THE SKIN IN THE MORNING, AND 35 UNITS UNDER THE SKIN IN THE EVENING   5 Syringe   3   . losartan (COZAAR) 50 MG tablet   Oral   Take 1 tablet (50 mg total) by mouth daily.  90 tablet   3   . pravastatin (PRAVACHOL) 80 MG tablet   Oral   Take 1 tablet (80 mg total) by mouth at bedtime. for Cholesterol   90 tablet   3   . sildenafil (VIAGRA) 100 MG tablet   Oral   Take 1 tablet (100 mg total) by mouth as needed for erectile dysfunction. Take 1 tablet 30 minutes to 4 hours before intercourse   30 tablet   3   . TOPROL XL 100 MG 24 hr tablet      TAKE 1 TABLET TWO TIMES DAILY. TAKE WITH OR IMMEDIATELY FOLLOWING A MEAL   180 tablet   2     Dispense as written.   . Transparent Dressings (TEGADERM + PAD 3-1/2"X4") MISC      Apply 1 patch every 3-4 days or when falls off. Dispense 5 patches   1 each   0   . traZODone (DESYREL) 50 MG tablet      TAKE 1 TABLET AT BEDTIME AS NEEDED FOR INSOMNIA   90 tablet   3     BP 146/76  Pulse 91  Temp(Src) 98.9 F (37.2 C) (Oral)  Resp 16  SpO2 98%  Physical Exam  Nursing note and vitals reviewed. Constitutional: He is oriented to person, place, and time. He appears well-developed and well-nourished. No  distress.  HENT:  Head: Normocephalic and atraumatic.  Right Ear: External ear normal.  Left Ear: External ear normal.  Mouth/Throat: Oropharynx is clear and moist. No oropharyngeal exudate.  Eyes: Conjunctivae are normal. Pupils are equal, round, and reactive to light. Right eye exhibits no discharge.  Neck: Normal range of motion. Neck supple. No tracheal deviation present.  Cardiovascular: Normal rate, regular rhythm and intact distal pulses.   Pulmonary/Chest: Effort normal. No respiratory distress. He has no wheezes. He has no rales.  Abdominal: Soft. He exhibits no distension. There is no tenderness. There is no rebound and no guarding.  Genitourinary: Guaiac positive stool (frank melena).  Musculoskeletal: Normal range of motion.  Neurological: He is alert and oriented to person, place, and time.  Skin: Skin is warm and dry. No rash noted. He is not diaphoretic.  Psychiatric: He has a normal mood and affect.    ED Course  Procedures (including critical care time)  Labs Reviewed  CBC - Abnormal; Notable for the following:    WBC 11.8 (*)    RBC 1.77 (*)    Hemoglobin 5.7 (*)    HCT 16.7 (*)    All other components within normal limits  COMPREHENSIVE METABOLIC PANEL - Abnormal; Notable for the following:    Sodium 134 (*)    Chloride 94 (*)    Glucose, Bld 331 (*)    BUN 30 (*)    GFR calc non Af Amer 63 (*)    GFR calc Af Amer 73 (*)    All other components within normal limits  APTT - Abnormal; Notable for the following:    aPTT 38 (*)    All other components within normal limits  URINALYSIS, ROUTINE W REFLEX MICROSCOPIC - Abnormal; Notable for the following:    APPearance CLOUDY (*)    Glucose, UA >1000 (*)    Ketones, ur 15 (*)    All other components within normal limits  GLUCOSE, CAPILLARY - Abnormal; Notable for the following:    Glucose-Capillary 249 (*)    All other components within normal limits  GLUCOSE, CAPILLARY - Abnormal; Notable for the following:  Glucose-Capillary 243 (*)    All other components within normal limits  OCCULT BLOOD, POC DEVICE - Abnormal; Notable for the following:    Fecal Occult Bld POSITIVE (*)    All other components within normal limits  PROTIME-INR  URINE MICROSCOPIC-ADD ON  OCCULT BLOOD X 1 CARD TO LAB, STOOL  CBC  TYPE AND SCREEN  PREPARE RBC (CROSSMATCH)  ABO/RH   No results found.   1. GI bleed   2. Blood loss anemia   3. Diabetes       MDM   Jeffrey [name] is a 47 y.o. male who presents to the ED with dark stools and 1 episode of coffee ground emesis.  Frank melena on exam.  Guaiac positive.  Patient with no abdominal pain.  Hg 5.7.  Two units transfused.  No BRB from rectum or vomit.  Patient safe for admission.  Family medicine consulted for admission.  No emergent need for GI involvement tonight with normal vital signs and no active hemorrhage.   Patient admitted after transfusions initiated.  No other acute concerns while in ED.      Rogelia Mire, MD 11/25/12 2241

## 2012-11-26 ENCOUNTER — Encounter (HOSPITAL_COMMUNITY)
Admission: EM | Disposition: A | Discharge status: Home / Self Care | Payer: Self-pay | Source: Home / Self Care | Attending: Family Medicine

## 2012-11-26 ENCOUNTER — Encounter (HOSPITAL_COMMUNITY): Payer: Self-pay | Admitting: *Deleted

## 2012-11-26 DIAGNOSIS — K922 Gastrointestinal hemorrhage, unspecified: Secondary | ICD-10-CM | POA: Diagnosis present

## 2012-11-26 HISTORY — PX: ESOPHAGOGASTRODUODENOSCOPY: SHX5428

## 2012-11-26 LAB — CBC
HCT: 22.5 % — ABNORMAL LOW (ref 39.0–52.0)
HCT: 22.5 % — ABNORMAL LOW (ref 39.0–52.0)
Hemoglobin: 7.6 g/dL — ABNORMAL LOW (ref 13.0–17.0)
Hemoglobin: 7.5 g/dL — ABNORMAL LOW (ref 13.0–17.0)
MCH: 31.1 pg (ref 26.0–34.0)
MCH: 31.1 pg (ref 26.0–34.0)
MCH: 31.4 pg (ref 26.0–34.0)
MCHC: 33.8 g/dL (ref 30.0–36.0)
MCHC: 33.8 g/dL (ref 30.0–36.0)
MCV: 93.4 fL (ref 78.0–100.0)
MCV: 92.9 fL (ref 78.0–100.0)
Platelets: 288 10*3/uL (ref 150–400)
RBC: 2.44 MIL/uL — ABNORMAL LOW (ref 4.22–5.81)
RBC: 2.41 MIL/uL — ABNORMAL LOW (ref 4.22–5.81)
RDW: 15.5 % (ref 11.5–15.5)
WBC: 9.4 10*3/uL (ref 4.0–10.5)

## 2012-11-26 LAB — GLUCOSE, CAPILLARY
Glucose-Capillary: 149 mg/dL — ABNORMAL HIGH (ref 70–99)
Glucose-Capillary: 255 mg/dL — ABNORMAL HIGH (ref 70–99)
Glucose-Capillary: 85 mg/dL (ref 70–99)
Glucose-Capillary: 89 mg/dL (ref 70–99)

## 2012-11-26 SURGERY — EGD (ESOPHAGOGASTRODUODENOSCOPY)
Anesthesia: Moderate Sedation | Laterality: Left

## 2012-11-26 MED ORDER — MIDAZOLAM HCL 5 MG/ML IJ SOLN
INTRAMUSCULAR | Status: AC
  Filled 2012-11-26: qty 2

## 2012-11-26 MED ORDER — SODIUM CHLORIDE 0.9 % IV SOLN: INTRAVENOUS | Status: DC

## 2012-11-26 MED ORDER — MIDAZOLAM HCL 10 MG/2ML IJ SOLN
INTRAMUSCULAR | Status: DC | PRN
  Administered 2012-11-26: 1 mg via INTRAVENOUS
  Administered 2012-11-26: 2 mg via INTRAVENOUS
  Administered 2012-11-26: 1 mg via INTRAVENOUS
  Administered 2012-11-26 (×2): 2 mg via INTRAVENOUS

## 2012-11-26 MED ORDER — FENTANYL CITRATE 0.05 MG/ML IJ SOLN
INTRAMUSCULAR | Status: DC | PRN
  Administered 2012-11-26 (×3): 25 ug via INTRAVENOUS

## 2012-11-26 MED ORDER — BUTAMBEN-TETRACAINE-BENZOCAINE 2-2-14 % EX AERO
INHALATION_SPRAY | CUTANEOUS | Status: DC | PRN
  Administered 2012-11-26: 2 via TOPICAL

## 2012-11-26 MED ORDER — AMLODIPINE BESYLATE 5 MG PO TABS
5.0000 mg | ORAL_TABLET | Freq: Every day | ORAL | Status: DC
  Administered 2012-11-26 – 2012-11-27 (×2): 5 mg via ORAL
  Filled 2012-11-26 (×2): qty 1

## 2012-11-26 MED ORDER — PANTOPRAZOLE SODIUM 40 MG PO TBEC
40.0000 mg | DELAYED_RELEASE_TABLET | Freq: Two times a day (BID) | ORAL | Status: DC
  Administered 2012-11-26 – 2012-11-28 (×4): 40 mg via ORAL
  Filled 2012-11-26 (×4): qty 1

## 2012-11-26 MED ORDER — LOSARTAN POTASSIUM 50 MG PO TABS
50.0000 mg | ORAL_TABLET | Freq: Every day | ORAL | Status: DC
  Administered 2012-11-26 – 2012-11-28 (×3): 50 mg via ORAL
  Filled 2012-11-26 (×3): qty 1

## 2012-11-26 MED ORDER — FENTANYL CITRATE 0.05 MG/ML IJ SOLN
INTRAMUSCULAR | Status: AC
  Filled 2012-11-26: qty 2

## 2012-11-26 MED ORDER — DIPHENHYDRAMINE HCL 50 MG/ML IJ SOLN
INTRAMUSCULAR | Status: DC | PRN
  Administered 2012-11-26 (×2): 12.5 mg via INTRAVENOUS

## 2012-11-26 MED ORDER — DIPHENHYDRAMINE HCL 50 MG/ML IJ SOLN
INTRAMUSCULAR | Status: AC
  Filled 2012-11-26: qty 1

## 2012-11-26 NOTE — Progress Notes (Signed)
Family Medicine Teaching Service Daily Progress Note Service Page: [phone]  Subjective: Patient states he feels much better this morning. Denies light headedness, diarrhea, emesis, chest pain, and shortness of breath.  Objective: Temp:  [98.1 F (36.7 C)-99.5 F (37.5 C)] 98.6 F (37 C) (02/20 0400) Pulse Rate:  [80-97] 87 (02/20 0400) Resp:  [16-24] 21 (02/20 0400) BP: (113-169)/(47-104) 169/83 mmHg (02/20 0400) SpO2:  [96 %-100 %] 96 % (02/20 0400) Weight:  [271 lb 9.7 oz (123.2 kg)-274 lb 14.6 oz (124.7 kg)] 271 lb 9.7 oz (123.2 kg) (02/20 0600) Exam: General: no acute distress, laying comfortably in bed HEENT: conjunctival pallor Cardiovascular: rrr, 2/6 flow murmur Respiratory: CTAB anteriorly Abdomen: S, NT, ND, no HSM Extremities: no edema  CBC BMET   Recent Labs Lab 11/25/12 1650 11/26/12 0257 11/26/12 0620  WBC 11.8* 10.5 9.4  HGB 5.7* 7.6* 7.5*  HCT 16.7* 22.5* 22.5*  PLT 351 280 288    Recent Labs Lab 11/25/12 1650  NA 134*  K 4.3  CL 94*  CO2 22  BUN 30*  CREATININE 1.31  GLUCOSE 331*  CALCIUM 8.7     EKG: NSR, no ST pr T wave changes  Assessment/Plan: Jeffrey [name] is a 47 y.o. year old male presenting with coffee ground emesis and previous history of GI bleed in 2009  1. Upper GI Bleed: Pt NPO and had not eaten in three days and having melena and coffee ground emesis x 1 last PM. Also c/o postural hypotension Sx including dizziness/lightheadedness  -Continue Protonix drip -Hgb on admission 5.7. Tranfused 2 uPRBC and postranfusion Hgb 7.6.  -continue q4 CBCs until scoped -Consult to GI, greatly appreciate recommendations. They will evaluate the patient this morning and perform endoscopy.   2. Diabetes Mellitus II: A1c 8.7, on Lantus 70 U at home.  -lantus 35 U until not NPO  -SSI along with CBG checks with meals -QHS insulin -NPO, no mealtime coverage at this time  3. Hyperlipidemia: Zocor while in hospital. Resume home Pravachol upon  d/c.  4. HTN: BP on admission [id number] systolic. This morning 169/83. -will continue to hold BP medications until GI bleed identified -on norvasc, losartan, toprol XL, and lasix  5. Anxiety: Continue home Trazadone  6. Alcohol Abuse: CIWA score of 0 -CSW consult  -CIWA to prevent withdrawal   FEN/GI: Protonix GTT, NPO Prophylaxis: TED hose per active bleed Disposition: SDU pending GI recommendations Code Status: Full    Tommi Rumps, MD 11/26/2012, 7:58 AM

## 2012-11-26 NOTE — Progress Notes (Signed)
FMTS Attending Daily Note:  Annabell Sabal MD  [phone] pager  Family Practice pager:  [phone] I have seen and examined this patient and have reviewed their chart. I have discussed this patient with the resident. I agree with the resident's findings, assessment and care plan.  Please see my separate admit note for details.

## 2012-11-26 NOTE — Progress Notes (Signed)
Utilization Review Completed.  11/26/2012

## 2012-11-26 NOTE — H&P (Signed)
Notified B. Mclntyre,MD of pt prolonged QTc 0.51. New orders received.

## 2012-11-26 NOTE — H&P (Signed)
FMTS Attending Admission Note: Jeffrey Sabal MD Personal pager:  [phone] FPTS Service Pager:  [phone]  I  have seen and examined this patient, reviewed their chart. I have discussed this patient with the resident. I agree with the resident's findings, assessment and care plan.  47 yo M with 1 week history of coffee ground emesis, melena, decreased PO intake, shortness of breath, epigastric pain, and malaise x 1 week.  Noted 1 BM of BRBPR yesterday and presented to ED at [facility].  Found to have Hgb ~5.  Given 2 units of blood with some subjective improvement.  FPTS called for admission.  Exam: Gen:  AAM, obese, NAD, lying in hospital bed HEENT:  COnjunctival and oral pallor noted.  MMM Neck:  No JVD Heart:  RRR Lungs:  Clear Abdomen:  Obese.  Soft/ND.  Good bowel sounds.  Some mild epigastric tenderness Ext:  No edema  Imp/Plan: 1.  GI bleed 2.  Malaise/SOB 3.  DM2 with history of home hypoglycemic episodes 4.  EtOH abuse Recommendations: - Agree with Protonix drip, GI consult, blood transfusions.   - Likely endoscopy later today.  - I do not see a EKG in this diabetic with known GI bleed, shortness of breath, diabetes, epigastric pain who will be undergoing procedure later today.  Stat EKG now. - Continue CBC checks.

## 2012-11-26 NOTE — Op Note (Signed)
[facility] [place], [id number]   ENDOSCOPY PROCEDURE REPORT  PATIENT: Jeffrey, [name]  MR#: [id number] BIRTHDATE: 11-15-1965 , 20  yrs. old GENDER: Male ENDOSCOPIST: Acquanetta Sit, MD REFERRED BY: PROCEDURE DATE:  11/26/2012 PROCEDURE:   EGD ASA CLASS: 2 INDICATIONS: melena, coffee-ground emesis, anemia MEDICATIONS: fentanyl 75 mcg IV, Versed 8 mg IV, and a drill 25 mg IV TOPICAL ANESTHETIC:  DESCRIPTION OF PROCEDURE:   After the risks benefits and alternatives of the procedure were thoroughly explained, informed consent was obtained.  The Pentax Gastroscope [id number]  endoscope was introduced through the mouth and advanced to the second portion of the duodenum      , limited by Without limitations.   The instrument was slowly withdrawn as the mucosa was fully examined.      FINDINGS:  Esophagus: There was a 1 cm linear ulcer beginning at the esophagogastric junction and extending superiorly.no evidence of active bleeding at this time.  Stomach: Moderate size hiatal hernia otherwise normal-appearing gastric mucosa.  Duodenum: Normal  COMPLICATIONS:none  ENDOSCOPIC IMPRESSION:see above   RECOMMENDATIONS:PPI therapy. Advanced diet as tolerated. We will sign off at this point. Call again if needed.   REPEAT EXAM:   _______________________________ Lorrin MaisAcquanetta Sit, MD 11/26/2012 10:43 AM

## 2012-11-26 NOTE — Consult Note (Signed)
[facility] Consultation Note  Referring Provider: Dr. Esmond Camper (Medical Teaching Service) Primary Care Physician:  Teresa Coombs, DO  Reason for Consultation:  Anemia, melena  HPI: Jeffrey [name] is a 47 y.o. male admitted for, and whom we've been asked to see for, the above reasons.  For the past few days, patient has had progressive weakness and fatigue, associated with concurrent periumbilical abdominal pain and melena.  Some coffee ground emesis, but no red hematemesis, no hematochezia.  Endorses prior episode of bleeding in 2009, but doesn't know if he had endoscopic evaluation (I can not locate this in the EMR database).  Patient denies prior colonoscopy.  Upon admission, had Hgb 5.7 and was given blood transfusion, and he feels better with this.  No weight loss, dysphagia, change in bowel frequency.   Past Medical History  Diagnosis Date  . Hypertension   . Blood transfusion   . Diabetes mellitus   . Venous insufficiency 12/13/2011    Past Surgical History  Procedure Laterality Date  . Knee arthroscopy      Prior to Admission medications   Medication Sig Start Date End Date Taking? Authorizing Provider  amLODipine (NORVASC) 5 MG tablet Take 1 tablet (5 mg total) by mouth daily. 11/26/11  Yes Gerda Diss, DO  aspirin (ADULT ASPIRIN EC LOW STRENGTH) 81 MG EC tablet Take 1 tablet (81 mg total) by mouth daily. 08/21/11  Yes Sibyl Parr, MD  furosemide (LASIX) 40 MG tablet Take 0.5 tablets (20 mg total) by mouth daily. 11/26/11  Yes Gerda Diss, DO  insulin aspart (NOVOLOG FLEXPEN) 100 UNIT/ML injection Inject 5-10 units before meals as directed by your sliding scale. 08/21/11  Yes Sibyl Parr, MD  insulin glargine (LANTUS) 100 UNIT/ML injection Inject 70 Units into the skin daily.   Yes Historical Provider, MD  losartan (COZAAR) 50 MG tablet Take 1 tablet (50 mg total) by mouth daily. 11/26/11  Yes Gerda Diss, DO  metoprolol succinate (TOPROL-XL) 100  MG 24 hr tablet Take 100 mg by mouth 2 (two) times daily.   Yes Historical Provider, MD  pravastatin (PRAVACHOL) 80 MG tablet Take 1 tablet (80 mg total) by mouth at bedtime. for Cholesterol 11/26/11  Yes Gerda Diss, DO  sildenafil (VIAGRA) 100 MG tablet Take 1 tablet (100 mg total) by mouth as needed for erectile dysfunction. Take 1 tablet 30 minutes to 4 hours before intercourse 11/26/11  Yes Gerda Diss, DO  traZODone (DESYREL) 50 MG tablet Take 50 mg by mouth at bedtime.   Yes Historical Provider, MD    Current Facility-Administered Medications  Medication Dose Route Frequency Provider Last Rate Last Dose  . 0.9 %  sodium chloride infusion   Intravenous Continuous Bryan R Hess, DO 150 mL/hr at 11/26/12 0700    . acetaminophen (TYLENOL) tablet 650 mg  650 mg Oral Q6H PRN Nolon Rod, DO       Or  . acetaminophen (TYLENOL) suppository 650 mg  650 mg Rectal Q6H PRN Nolon Rod, DO      . folic acid (FOLVITE) tablet 1 mg  1 mg Oral Daily Bryan R Hess, DO   1 mg at 11/25/12 2140  . insulin aspart (novoLOG) injection 0-15 Units  0-15 Units Subcutaneous TID WC Bryan R Hess, DO      . insulin aspart (novoLOG) injection 0-5 Units  0-5 Units Subcutaneous QHS Nolon Rod, DO   2 Units at 11/25/12 2207  . insulin  glargine (LANTUS) injection 35 Units  35 Units Subcutaneous QHS Tamela Oddi Hess, DO   35 Units at 11/25/12 2207  . LORazepam (ATIVAN) tablet 1 mg  1 mg Oral Q6H PRN Nolon Rod, DO       Or  . LORazepam (ATIVAN) injection 1 mg  1 mg Intravenous Q6H PRN Tamela Oddi Hess, DO      . multivitamin with minerals tablet 1 tablet  1 tablet Oral Daily Bryan R Hess, DO   1 tablet at 11/25/12 2140  . pantoprazole (PROTONIX) 80 mg in sodium chloride 0.9 % 250 mL infusion  8 mg/hr Intravenous Continuous Wiley Thuet, MD 25 mL/hr at 11/26/12 0700 8 mg/hr at 11/26/12 0700  . simvastatin (ZOCOR) tablet 20 mg  20 mg Oral q1800 Bryan R Hess, DO      . sodium chloride 0.9 % injection 3 mL  3 mL Intravenous  Q12H Bryan R Hess, DO   3 mL at 11/25/12 2324  . thiamine (VITAMIN B-1) tablet 100 mg  100 mg Oral Daily Tamela Oddi Hess, DO   100 mg at 11/25/12 2140   Or  . thiamine (B-1) injection 100 mg  100 mg Intravenous Daily Bryan R Hess, DO      . traZODone (DESYREL) tablet 50 mg  50 mg Oral QHS Bryan R Hess, DO   50 mg at 11/25/12 2339    Allergies as of 11/25/2012 - Review Complete 11/25/2012  Allergen Reaction Noted  . Lisinopril  11/30/2009    No family history on file.  History   Social History  . Marital Status: Married    Spouse Name: N/A    Number of Children: N/A  . Years of Education: N/A   Occupational History  . Not on file.   Social History Main Topics  . Smoking status: Never Smoker   . Smokeless tobacco: Not on file  . Alcohol Use: 8.4 oz/week    14 Cans of beer per week     Comment: couple cans day  . Drug Use: No  . Sexually Active: Yes   Other Topics Concern  . Not on file   Social History Narrative  . No narrative on file    Review of Systems: Positive = bold Gen: Denies any fever, chills, rigors, night sweats, anorexia, fatigue, weakness, malaise, involuntary weight loss, and sleep disorder CV: Denies chest pain, angina, palpitations, syncope, orthopnea, PND, peripheral edema, and claudication. Resp: Denies dyspnea, cough, sputum, wheezing, coughing up blood. GI: Described in detail in HPI.    GU : Denies urinary burning, blood in urine, urinary frequency, urinary hesitancy, nocturnal urination, and urinary incontinence. MS: Denies joint pain or swelling.  Denies muscle weakness, cramps, atrophy.  Derm: Denies rash, itching, oral ulcerations, hives, unhealing ulcers.  Psych: Denies depression, anxiety, memory loss, suicidal ideation, hallucinations,  and confusion. Heme: Denies bruising, bleeding, and enlarged lymph nodes. Neuro:  Denies any headaches, dizziness, paresthesias. Endo:  Denies any problems with DM, thyroid, adrenal function.  Physical  Exam: Vital signs in last 24 hours: Temp:  [98.1 F (36.7 C)-99.5 F (37.5 C)] 98.6 F (37 C) (02/20 0832) Pulse Rate:  [79-97] 79 (02/20 0832) Resp:  [16-24] 20 (02/20 0832) BP: (113-169)/(47-104) 127/61 mmHg (02/20 0832) SpO2:  [96 %-100 %] 100 % (02/20 0832) Weight:  [123.2 kg (271 lb 9.7 oz)-124.7 kg (274 lb 14.6 oz)] 123.2 kg (271 lb 9.7 oz) (02/20 0600)   General:   Alert,  Overweight, Well-developed, well-nourished, pleasant and  cooperative in NAD Head:  Normocephalic and atraumatic. Eyes:  Sclera clear, no icterus.   Conjunctiva somewhat pale. Ears:  Normal auditory acuity. Nose:  No deformity, discharge,  or lesions. Mouth:  No deformity or lesions.  Mucous membranes dry and a bit pale Neck:  Thick but supple; no masses or thyromegaly. Lungs:  Clear throughout to auscultation.   No wheezes, crackles, or rhonchi. No acute distress. Heart:  Regular rate and rhythm; no murmurs, clicks, rubs,  or gallops. Abdomen:  Soft, protuberant, mild periumbilical tenderness, nontender and nondistended. No masses, hepatosplenomegaly or hernias noted. Normal bowel sounds, without guarding, and without rebound.     Msk:  Symmetrical without gross deformities. Normal posture. Pulses:  Normal pulses noted. Extremities:  Without clubbing or edema. Neurologic:  Alert and  oriented x4;  Diffusely weak, otherwise grossly normal neurologically. Skin:  Intact without significant lesions or rashes. Psych:  Alert and cooperative. Normal mood and affect.  Lab Results:  Recent Labs  11/25/12 1650 11/26/12 0257 11/26/12 0620  WBC 11.8* 10.5 9.4  HGB 5.7* 7.6* 7.5*  HCT 16.7* 22.5* 22.5*  PLT 351 280 288   BMET  Recent Labs  11/25/12 1650  NA 134*  K 4.3  CL 94*  CO2 22  GLUCOSE 331*  BUN 30*  CREATININE 1.31  CALCIUM 8.7   LFT  Recent Labs  11/25/12 1650  PROT 7.2  ALBUMIN 3.5  AST 32  ALT 25  ALKPHOS 68  BILITOT 0.4   PT/INR  Recent Labs  11/25/12 1700  LABPROT 14.3   INR 1.13    Studies/Results: No results found.  Impression:  1.  Melena, suspect UGI tract source such as ulcer.  Hemodynamically stable. 2.  Acute blood loss anemia.  Plan:  1.  NPO for now. 2.  Protonix. 3.  Endoscopy today for further evaluation. 4.  If endoscopy is negative, consider colonoscopy.   LOS: 1 day   Demontre Padin M  11/26/2012, 8:54 AM

## 2012-11-26 NOTE — Clinical Social Work Psychosocial (Signed)
     Clinical Social Work Department BRIEF PSYCHOSOCIAL ASSESSMENT 11/26/2012  Patient:  JULIAN, MALE     Account Number:  [id number]     Admit date:  11/25/2012  Clinical Social Worker:  Katrinka Blazing  Date/Time:  11/26/2012 12:00 M  Referred by:  Physician  Date Referred:  11/26/2012 Referred for  Substance Abuse   Other Referral:   Interview type:  Family Other interview type:    PSYCHOSOCIAL DATA Living Status:  FAMILY Admitted from facility:   Level of care:   Primary support name:  Maggie Font: [phone] Primary support relationship to patient:  SPOUSE Degree of support available:   Adequate.    CURRENT CONCERNS Current Concerns  Post-Acute Placement   Other Concerns:    SOCIAL WORK ASSESSMENT / PLAN Clinical Social Worker recieved referral from MD for current substance abuse.  CSW reviewed chart and met with family at bedside; pt currently in procedure.  CSW introduced self, explianed role, and provided support. Family appeared receptive to CSW intervention and requested to speak with RN/MD to address medical questions and recieve an update; RN notified.  CSW to continue to follow and assist as needed.   Assessment/plan status:  Information/Referral to [employer] Other assessment/ plan:   Information/referral to community resources:   Hospital waiting areas.    PATIENTS/FAMILYS RESPONSE TO PLAN OF CARE: Pt currently unavailable.  Family appeared receptive to CSW intervention.

## 2012-11-27 ENCOUNTER — Encounter (HOSPITAL_COMMUNITY): Payer: Self-pay | Admitting: Gastroenterology

## 2012-11-27 DIAGNOSIS — N289 Disorder of kidney and ureter, unspecified: Secondary | ICD-10-CM

## 2012-11-27 LAB — GLUCOSE, CAPILLARY
Glucose-Capillary: 162 mg/dL — ABNORMAL HIGH (ref 70–99)
Glucose-Capillary: 220 mg/dL — ABNORMAL HIGH (ref 70–99)
Glucose-Capillary: 257 mg/dL — ABNORMAL HIGH (ref 70–99)

## 2012-11-27 LAB — CBC
HCT: 19.3 % — ABNORMAL LOW (ref 39.0–52.0)
Hemoglobin: 6.6 g/dL — CL (ref 13.0–17.0)
MCH: 30.3 pg (ref 26.0–34.0)
MCHC: 34.4 g/dL (ref 30.0–36.0)
MCHC: 33.7 g/dL (ref 30.0–36.0)
MCV: 91.5 fL (ref 78.0–100.0)
MCV: 90 fL (ref 78.0–100.0)
Platelets: 320 10*3/uL (ref 150–400)
Platelets: 279 10*3/uL (ref 150–400)
RDW: 15.3 % (ref 11.5–15.5)
RDW: 16 % — ABNORMAL HIGH (ref 11.5–15.5)
RDW: 16.2 % — ABNORMAL HIGH (ref 11.5–15.5)
WBC: 8.8 10*3/uL (ref 4.0–10.5)
WBC: 10 10*3/uL (ref 4.0–10.5)

## 2012-11-27 LAB — PREPARE RBC (CROSSMATCH)

## 2012-11-27 NOTE — Progress Notes (Signed)
FMTS Attending Daily Note:  Annabell Sabal MD  [phone] pager  Family Practice pager:  [phone] I have seen and examined this patient and have reviewed their chart. I have discussed this patient with the resident. I agree with the resident's findings, assessment and care plan.  Additionally: - Hgb has dropped again.   - He will receive another 2 units of blood and GI will see him again.

## 2012-11-27 NOTE — Progress Notes (Signed)
CRITICAL VALUE ALERT  Critical value received:  Hemoglobin 6.6   Date of notification:  11/27/12  Time of notification:  0714  Critical value read back:yes  Nurse who received alert:  L. Micheline Chapman, RN  MD notified (1st page):  Mingo Amber  Time of first page:  0714  MD notified (2nd page):  Time of second page:  Responding MD:   Time MD responded:  [phone]

## 2012-11-27 NOTE — Progress Notes (Signed)
Family Medicine Teaching Service Daily Progress Note Service Page: [phone]  Subjective: Patient states he feels much better this morning. Denies light headedness, diarrhea, emesis, chest pain, and shortness of breath.  Objective: Temp:  [98 F (36.7 C)-99 F (37.2 C)] 98.4 F (36.9 C) (02/21 0421) Pulse Rate:  [83-102] 102 (02/21 0421) Resp:  [14-23] 20 (02/21 0421) BP: (146-217)/(77-153) 146/77 mmHg (02/21 0421) SpO2:  [97 %-100 %] 97 % (02/21 0421) Weight:  [274 lb 4 oz (124.4 kg)] 274 lb 4 oz (124.4 kg) (02/20 2156) Exam: General: no acute distress, laying comfortably in bed HEENT: conjunctival pallor Cardiovascular: rrr, 2/6 flow murmur Respiratory: CTAB anteriorly Abdomen: S, NT, ND, no HSM Extremities: no edema  CBC BMET   Recent Labs Lab 11/26/12 0620 11/26/12 1604 11/27/12 0610  WBC 9.4 8.8 8.8  HGB 7.5* 7.5* 6.6*  HCT 22.5* 22.2* 19.3*  PLT 288 282 289    Recent Labs Lab 11/25/12 1650  NA 134*  K 4.3  CL 94*  CO2 22  BUN 30*  CREATININE 1.31  GLUCOSE 331*  CALCIUM 8.7     EKG: NSR, no ST pr T wave changes EGD: 1 cm ulcer at esophagogastric junction, no active bleed  Assessment/Plan: Jeffrey [name] is a 47 y.o. year old male presenting with coffee ground emesis and previous history of GI bleed in 2009  1. Upper GI Bleed: EGD evidence of non-bleeding ulcer felt to be the source. Blood clots in BM last night and drop in Hgb to 6.6 today. With continued lightheadedness. -Continue Protonix PO -s/p 2 U pRBCs with post transfusion Hgb of 7.6 -repeat Hgb of 6.6 this morning-have asked for 2 additional units of pRBCs and called GI to get their input-state transfuse and trend Hgb, will check on patient, greatly appreciate their help with this patient  2. Diabetes Mellitus II: A1c 8.7, on Lantus 70 U at home.  -lantus 35 U until better PO intake -SSI along with CBG checks with meals -QHS insulin  3. Hyperlipidemia: Zocor while in hospital. Resume home  Pravachol upon d/c.  4. HTN: BP elevated upon admission and held medications, improved to 140's-150's/70's-90's upon restarting some home medications. -restarted norvasc and losartan, will continue to hold toprol XL until more stable Hgb -on norvasc, losartan, toprol XL, and lasix  5. Anxiety: Continue home Trazadone  6. Alcohol Abuse: CIWA score of 1 -CSW consult  -CIWA to prevent withdrawal   FEN/GI: Protonix PO, regular diet  Prophylaxis: TED hose per active bleed Disposition: floor status, discharge pending stabilization of Hgb and improvement in symptoms Code Status: Full    Tommi Rumps, MD 11/27/2012, 9:21 AM

## 2012-11-28 ENCOUNTER — Other Ambulatory Visit: Payer: Self-pay | Admitting: Sports Medicine

## 2012-11-28 DIAGNOSIS — D62 Acute posthemorrhagic anemia: Secondary | ICD-10-CM

## 2012-11-28 LAB — TYPE AND SCREEN
ABO/RH(D): O POS
Unit division: 0

## 2012-11-28 LAB — CBC
MCV: 89.1 fL (ref 78.0–100.0)
Platelets: 304 10*3/uL (ref 150–400)
RBC: 3.11 MIL/uL — ABNORMAL LOW (ref 4.22–5.81)
RDW: 16 % — ABNORMAL HIGH (ref 11.5–15.5)
WBC: 8.6 10*3/uL (ref 4.0–10.5)

## 2012-11-28 LAB — GLUCOSE, CAPILLARY
Glucose-Capillary: 205 mg/dL — ABNORMAL HIGH (ref 70–99)
Glucose-Capillary: 138 mg/dL — ABNORMAL HIGH (ref 70–99)

## 2012-11-28 LAB — BASIC METABOLIC PANEL
Chloride: 101 mEq/L (ref 96–112)
Creatinine, Ser: 0.9 mg/dL (ref 0.50–1.35)
GFR calc Af Amer: >90 mL/min (ref >90)
Potassium: 3.3 mEq/L — ABNORMAL LOW (ref 3.5–5.1)
Sodium: 135 mEq/L (ref 135–145)

## 2012-11-28 MED ORDER — ACETAMINOPHEN 325 MG PO TABS: 650.0000 mg | ORAL_TABLET | Freq: Four times a day (QID) | ORAL | Status: DC | PRN

## 2012-11-28 MED ORDER — METOPROLOL SUCCINATE ER 100 MG PO TB24
100.0000 mg | ORAL_TABLET | Freq: Once | ORAL | Status: AC
  Administered 2012-11-28: 100 mg via ORAL
  Filled 2012-11-28 (×2): qty 1

## 2012-11-28 MED ORDER — METOPROLOL SUCCINATE ER 100 MG PO TB24: 100.0000 mg | ORAL_TABLET | Freq: Every day | ORAL | Status: DC

## 2012-11-28 MED ORDER — INSULIN GLARGINE 100 UNIT/ML ~~LOC~~ SOLN: 70.0000 [IU] | Freq: Every day | SUBCUTANEOUS | Status: DC

## 2012-11-28 MED ORDER — INSULIN GLARGINE 100 UNIT/ML ~~LOC~~ SOLN
15.0000 [IU] | Freq: Once | SUBCUTANEOUS | Status: AC
  Administered 2012-11-28: 15 [IU] via SUBCUTANEOUS

## 2012-11-28 MED ORDER — AMLODIPINE BESYLATE 10 MG PO TABS: 10.0000 mg | ORAL_TABLET | Freq: Every day | ORAL | Status: DC

## 2012-11-28 MED ORDER — AMLODIPINE BESYLATE 10 MG PO TABS
10.0000 mg | ORAL_TABLET | Freq: Every day | ORAL | Status: DC
  Administered 2012-11-28: 10 mg via ORAL
  Filled 2012-11-28: qty 1

## 2012-11-28 MED ORDER — HYDRALAZINE HCL 20 MG/ML IJ SOLN
5.0000 mg | Freq: Once | INTRAMUSCULAR | Status: AC
  Administered 2012-11-28: 5 mg via INTRAVENOUS
  Filled 2012-11-28: qty 0.25

## 2012-11-28 MED ORDER — POTASSIUM CHLORIDE CRYS ER 20 MEQ PO TBCR: 40.0000 meq | EXTENDED_RELEASE_TABLET | Freq: Two times a day (BID) | ORAL | Status: DC

## 2012-11-28 NOTE — Progress Notes (Signed)
FMTS Attending Daily Note:  Jeffrey Sabal MD  [phone] pager  Family Practice pager:  [phone] I have seen and examined this patient and have reviewed their chart. I have discussed this patient with the resident. I agree with the resident's findings, assessment and care plan.  Additionally: - Patient much improved today.  Walking up and down halls without any orthostatic/pre-syncopal symptoms.   - Minimal melena, no frank bleeding x 24 hours.   - CBC's have remained stable. - As doing well, DC home today on continued high dose PPI therapy.  - I tried to schedule a lab appt for him on Monday AM, but I could not figure out Epic appointment system.  He is to call first thing Monday for lab appt for CBC.  I will make sure a future order for this is placed.  - For blood pressure, resume home blood pressure medications, including metoprolol which we were not doing here secondary to concern for orthostasis with GI bleed.

## 2012-11-28 NOTE — Progress Notes (Signed)
11/28/12 Patient being discharged home today. IV site removed. Discharge instructions reviewed with patient.

## 2012-11-28 NOTE — Progress Notes (Signed)
Family Medicine Teaching Service DISCHARGE SUMMARY Service Page: [phone]  Dates of Hospitalization:  11/25/2012 to 11/28/2012 Length of Stay: 3 days  Admission Diagnoses  Coffee Ground Emesis  Discharge Diagnoses   Active Hospital Problems   Diagnosis Date Noted  . GI bleed 11/26/2012  . ALCOHOL ABUSE, HX OF 11/10/2007  . DIABETES MELLITUS II, UNCOMPLICATED Q000111Q    Resolved Hospital Problems   Diagnosis Date Noted Date Resolved  No resolved problems to display.   Patient Active Problem List   Diagnosis Date Noted  . GI bleed 11/26/2012  . Venous insufficiency 12/13/2011  . Leg wound, left 12/03/2011  . INSOMNIA, CHRONIC 02/14/2009  . 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 ALCOHOL ABUSE, HX OF 11/10/2007  . ERECTILE DYSFUNCTION 04/14/2007  . DIABETES MELLITUS II, UNCOMPLICATED Q000111Q  . HYPERCHOLESTEROLEMIA 12/04/2006  . OBESITY, NOS 12/04/2006  . ANXIETY 12/04/2006  . HYPERTENSION, BENIGN SYSTEMIC 12/04/2006  . ARTHRITIS 12/04/2006    Discharge Medications:     Medication List    TAKE these medications       acetaminophen 325 MG tablet  Commonly known as:  TYLENOL  Take 2 tablets (650 mg total) by mouth every 6 (six) hours as needed.     amLODipine 5 MG tablet  Commonly known as:  NORVASC  Take 1 tablet (5 mg total) by mouth daily.     aspirin 81 MG EC tablet  Commonly known as:  ADULT ASPIRIN EC LOW STRENGTH  Take 1 tablet (81 mg total) by mouth daily.     furosemide 40 MG tablet  Commonly known as:  LASIX  Take 0.5 tablets (20 mg total) by mouth daily.     insulin aspart 100 UNIT/ML injection  Commonly known as:  NOVOLOG FLEXPEN  Inject 5-10 units before meals as directed by your sliding scale.     insulin glargine 100 UNIT/ML injection  Commonly known as:  LANTUS  Inject 70 Units into the skin daily.     losartan 50 MG tablet  Commonly known as:  COZAAR  Take 1 tablet (50 mg total) by mouth daily.     metoprolol succinate 100 MG 24 hr tablet  Commonly known as:  TOPROL-XL  Take 100 mg by mouth 2 (two) times daily.     pravastatin 80 MG tablet  Commonly known as:  PRAVACHOL  Take 1 tablet (80 mg total) by mouth at bedtime. for Cholesterol     sildenafil 100 MG tablet  Commonly known as:  VIAGRA  Take 1 tablet (100 mg total) by mouth as needed for erectile dysfunction. Take 1 tablet 30 minutes to 4 hours before intercourse     traZODone 50 MG tablet  Commonly known as:  DESYREL  Take 50 mg by mouth at bedtime.        Discharge Information  Disposition: to home Discharge Diet: Resume diet Discharge Condition:  Improved Discharge Activity: Ad lib  Pending Results: none   Future Appointments Provider Department Dept Phone   12/07/2012 1:45 PM Gerda Diss, DO [place] [phone]     Follow-up Information   Follow up with Zuriah Bordas, DO On 12/07/2012. (@ 145)    Contact information:   [address] [place] [address] [phone]       Schedule an appointment as soon as possible for a visit with [place]. (for on Tuesday or Wednesday of this week with Horris Latino in the lab)  Contact information:   [address] [place] [address] [phone]      Objective Findings on day of Discharge  Temp:  [98.1 F (36.7 C)-98.9 F (37.2 C)] 98.1 F (36.7 C) (02/22 0528) Pulse Rate:  [66-102] 92 (02/22 0845) Resp:  [18-20] 20 (02/22 0528) BP: (122-200)/(74-109) 135/78 mmHg (02/22 0845) SpO2:  [98 %-100 %] 98 % (02/22 0528) Exam: General: no acute distress, laying comfortably in bed HEENT: conjunctival pallor Cardiovascular: rrr, 2/6 flow murmur Respiratory: CTAB anteriorly Abdomen: S, NT, ND, no HSM Extremities: no edema   Pertinent Labs During Hospitalization   CBC BMET   Recent Labs Lab 11/27/12 1823 11/27/12 2208 11/28/12 0029  WBC 10.0 7.9 8.6  HGB 9.7* 8.8* 9.5*  HCT 28.2* 26.1* 27.7*  PLT  320 279 304    Recent Labs Lab 11/25/12 1650 11/28/12 0430  NA 134* 135  K 4.3 3.3*  CL 94* 101  CO2 22 23  BUN 30* 5*  CREATININE 1.31 0.90  GLUCOSE 331* 196*  CALCIUM 8.7 8.3*     Recent Labs     11/25/12  1650  11/26/12  0257  11/26/12  0620  11/26/12  1604  11/27/12  0610  11/27/12  1823  11/27/12  2208  11/28/12  0029  HGB  5.7*  7.6*  7.5*  7.5*  6.6*  9.7*  8.8*  9.5*   EKG: NSR, no ST pr T wave changes EGD: 1 cm ulcer at esophagogastric junction, no active bleed   Procedures:  RPBC 2Units X 2 EGD: 11/26/2012 - 1cm linear ulceration @ esophagogastric junction, no active bleeding.  Moderate Hiatal hernia - Continue PPI therapy   Assessment/Plan: KIRT LEYVAS is a 47 y.o. year old male who presented with  coffee ground emesis and previous history of GI bleed in 2009  # Upper GI Bleed: EGD evidence of non-bleeding ulcer felt to be the source. Blood clots in BM last night and drop in Hgb to 6.6 today. With continued lightheadedness. -Continue Protonix PO -s/p 2 U pRBCs with post transfusion Hgb of 7.6 -repeat Hgb of 6.6 on hospital day #2 following EGD.   2 additional units of pRBCs given. Discussed with GI who recommended transfusion and trending of Hgb.  Hemoglobin continued to improve  > F/u CBC in 3 days, orders placed  # Diabetes Mellitus II: A1c 8.7, on Lantus 70 U at home.  Lantus and SSI adjusted during hospitalization, home compliance is on going issue.  Will resume home regimen with addition of consider addition of metformin vs glipizide,   # HTN: BP elevated upon admission and held medications, improved to 140's-150's/70's-90's upon restarting some home medications. -restarted norvasc and losartan, will continue to hold toprol XL until more stable Hgb - Resumed prior home regimen  >Consider titration of Norvasc or Losartan   # Hyperlipidemia: Zocor while in hospital. Resume home Pravachol upon d/c.  # Anxiety: Continue home Trazadone  #  Alcohol Abuse: CIWA score of 1 -CSW consult  -CIWA to prevent withdrawal    Gerda Diss, DO [place] [facility] Resident - PGY-2 11/28/2012 9:35 AM

## 2012-11-28 NOTE — Progress Notes (Signed)
Pts BP was elevated at 193/109. The pt stated he felt his blood sugar was high, once checked it was 205. Dr. Paulla Fore notified and new orders were given. Will continue to monitor.

## 2012-11-30 ENCOUNTER — Encounter: Payer: Self-pay | Admitting: *Deleted

## 2012-12-02 ENCOUNTER — Other Ambulatory Visit: Payer: BC Managed Care – PPO

## 2012-12-04 NOTE — Discharge Summary (Signed)
Family Medicine Teaching Service  Discharge Note : Attending Jeff Walden MD Pager [phone] Inpatient Team Pager:  [phone]  I have reviewed this patient and the patient's chart and have discussed discharge planning with the resident at the time of discharge. I agree with the discharge plan as above.    

## 2012-12-04 NOTE — Discharge Summary (Signed)
Family Medicine Teaching Service DISCHARGE SUMMARY Service Page: [phone]  Dates of Hospitalization:  11/25/2012 to 11/28/2012 Length of Stay: 3 days  Admission Diagnoses  Coffee Ground Emesis  Discharge Diagnoses   Active Hospital Problems   Diagnosis Date Noted  . GI bleed 11/26/2012  . ALCOHOL ABUSE, HX OF 11/10/2007  . DIABETES MELLITUS II, UNCOMPLICATED Q000111Q    Resolved Hospital Problems   Diagnosis Date Noted Date Resolved  No resolved problems to display.   Patient Active Problem List   Diagnosis Date Noted  . GI bleed 11/26/2012  . Venous insufficiency 12/13/2011  . Leg wound, left 12/03/2011  . INSOMNIA, CHRONIC 02/14/2009  . 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 ALCOHOL ABUSE, HX OF 11/10/2007  . ERECTILE DYSFUNCTION 04/14/2007  . DIABETES MELLITUS II, UNCOMPLICATED Q000111Q  . HYPERCHOLESTEROLEMIA 12/04/2006  . OBESITY, NOS 12/04/2006  . ANXIETY 12/04/2006  . HYPERTENSION, BENIGN SYSTEMIC 12/04/2006  . ARTHRITIS 12/04/2006    Discharge Medications:     Medication List    TAKE these medications       acetaminophen 325 MG tablet  Commonly known as:  TYLENOL  Take 2 tablets (650 mg total) by mouth every 6 (six) hours as needed.     amLODipine 5 MG tablet  Commonly known as:  NORVASC  Take 1 tablet (5 mg total) by mouth daily.     aspirin 81 MG EC tablet  Commonly known as:  ADULT ASPIRIN EC LOW STRENGTH  Take 1 tablet (81 mg total) by mouth daily.     furosemide 40 MG tablet  Commonly known as:  LASIX  Take 0.5 tablets (20 mg total) by mouth daily.     insulin aspart 100 UNIT/ML injection  Commonly known as:  NOVOLOG FLEXPEN  Inject 5-10 units before meals as directed by your sliding scale.     insulin glargine 100 UNIT/ML injection  Commonly known as:  LANTUS  Inject 70 Units into the skin daily.     losartan 50 MG tablet  Commonly known as:  COZAAR  Take 1 tablet (50 mg total) by mouth daily.     metoprolol succinate 100 MG 24 hr tablet  Commonly known as:  TOPROL-XL  Take 1 tablet (100 mg total) by mouth daily.     pravastatin 80 MG tablet  Commonly known as:  PRAVACHOL  Take 1 tablet (80 mg total) by mouth at bedtime. for Cholesterol     sildenafil 100 MG tablet  Commonly known as:  VIAGRA  Take 1 tablet (100 mg total) by mouth as needed for erectile dysfunction. Take 1 tablet 30 minutes to 4 hours before intercourse     traZODone 50 MG tablet  Commonly known as:  DESYREL  Take 50 mg by mouth at bedtime.        Discharge Information  Disposition: to home Discharge Diet: Resume diet Discharge Condition:  Improved Discharge Activity: Ad lib  Pending Results: none  Discharge Orders   Future Appointments Provider Department Dept Phone   12/07/2012 1:45 PM Gerda Diss, DO [place] [phone]   Future Orders Complete By Expires     Call MD for:  difficulty breathing, headache or visual disturbances  As directed     Call MD for:  extreme fatigue  As directed     Call MD for:  persistant dizziness or light-headedness  As directed     Diet - low sodium heart healthy  As directed  Increase activity slowly  As directed       Follow-up Information   Follow up with RIGBY, [place], DO On 12/07/2012. (@ 145)    Contact information:   [address] [place] [address] [phone]       Follow up with [facility] On 12/02/2012. (10:30 am lab appointment with Horris Latino for CBC check)    Contact information:   [place] [address] [phone]      Objective Findings on day of Discharge    Exam: General: no acute distress, laying comfortably in bed HEENT: conjunctival pallor Cardiovascular: rrr, 2/6 flow murmur Respiratory: CTAB anteriorly Abdomen: S, NT, ND, no HSM Extremities: no edema   Pertinent Labs During Hospitalization   CBC BMET   Recent Labs Lab 11/27/12 1823 11/27/12 2208  11/28/12 0029  WBC 10.0 7.9 8.6  HGB 9.7* 8.8* 9.5*  HCT 28.2* 26.1* 27.7*  PLT 320 279 304    Recent Labs Lab 11/28/12 0430  NA 135  K 3.3*  CL 101  CO2 23  BUN 5*  CREATININE 0.90  GLUCOSE 196*  CALCIUM 8.3*     No results found for this basename: HGB,  in the last 72 hours EKG: NSR, no ST pr T wave changes EGD: 1 cm ulcer at esophagogastric junction, no active bleed   Procedures:  RPBC 2Units X 2 EGD: 11/26/2012 - 1cm linear ulceration @ esophagogastric junction, no active bleeding.  Moderate Hiatal hernia - Continue PPI therapy   Assessment/Plan: Jeffrey [name] is a 47 y.o. year old male who presented with  coffee ground emesis and previous history of GI bleed in 2009  # Upper GI Bleed: EGD evidence of non-bleeding ulcer felt to be the source. Blood clots in BM last night and drop in Hgb to 6.6 today. With continued lightheadedness. -Continue Protonix PO -s/p 2 U pRBCs with post transfusion Hgb of 7.6 -repeat Hgb of 6.6 on hospital day #2 following EGD.   2 additional units of pRBCs given. Discussed with GI who recommended transfusion and trending of Hgb.  Hemoglobin continued to improve  > F/u CBC in 3 days, orders placed  # Diabetes Mellitus II: A1c 8.7, on Lantus 70 U at home.  Lantus and SSI adjusted during hospitalization, home compliance is on going issue.  Will resume home regimen with addition of consider addition of metformin vs glipizide,   # HTN: BP elevated upon admission and held medications, improved to 140's-150's/70's-90's upon restarting some home medications. -restarted norvasc and losartan, will continue to hold toprol XL until more stable Hgb - Resumed prior home regimen  >Consider titration of Norvasc or Losartan   # Hyperlipidemia: Zocor while in hospital. Resume home Pravachol upon d/c.  # Anxiety: Continue home Trazadone  # Alcohol Abuse: CIWA score of 1 -CSW consult  -CIWA to prevent withdrawal    Gerda Diss, DO [facility] Resident - PGY-2 12/04/2012 12:32 PM

## 2012-12-06 ENCOUNTER — Encounter (HOSPITAL_COMMUNITY): Payer: Self-pay | Admitting: Family Medicine

## 2012-12-06 ENCOUNTER — Inpatient Hospital Stay (HOSPITAL_COMMUNITY)
Admission: EM | Admit: 2012-12-06 | Discharge: 2012-12-14 | DRG: 208 | Disposition: A | Discharge status: Home / Self Care | Payer: Managed Care, Other (non HMO) | Attending: Family Medicine | Admitting: Family Medicine

## 2012-12-06 ENCOUNTER — Emergency Department (HOSPITAL_COMMUNITY): Payer: Managed Care, Other (non HMO)

## 2012-12-06 DIAGNOSIS — I872 Venous insufficiency (chronic) (peripheral): Secondary | ICD-10-CM

## 2012-12-06 DIAGNOSIS — Z794 Long term (current) use of insulin: Secondary | ICD-10-CM

## 2012-12-06 DIAGNOSIS — R0789 Other chest pain: Secondary | ICD-10-CM | POA: Diagnosis not present

## 2012-12-06 DIAGNOSIS — R9431 Abnormal electrocardiogram [ECG] [EKG]: Secondary | ICD-10-CM

## 2012-12-06 DIAGNOSIS — J811 Chronic pulmonary edema: Secondary | ICD-10-CM | POA: Diagnosis present

## 2012-12-06 DIAGNOSIS — R197 Diarrhea, unspecified: Secondary | ICD-10-CM | POA: Diagnosis present

## 2012-12-06 DIAGNOSIS — Z6837 Body mass index (BMI) 37.0-37.9, adult: Secondary | ICD-10-CM

## 2012-12-06 DIAGNOSIS — G4733 Obstructive sleep apnea (adult) (pediatric): Secondary | ICD-10-CM | POA: Diagnosis present

## 2012-12-06 DIAGNOSIS — G931 Anoxic brain damage, not elsewhere classified: Secondary | ICD-10-CM | POA: Diagnosis present

## 2012-12-06 DIAGNOSIS — Z9989 Dependence on other enabling machines and devices: Secondary | ICD-10-CM

## 2012-12-06 DIAGNOSIS — K029 Dental caries, unspecified: Secondary | ICD-10-CM | POA: Diagnosis not present

## 2012-12-06 DIAGNOSIS — F411 Generalized anxiety disorder: Secondary | ICD-10-CM

## 2012-12-06 DIAGNOSIS — K922 Gastrointestinal hemorrhage, unspecified: Secondary | ICD-10-CM

## 2012-12-06 DIAGNOSIS — R092 Respiratory arrest: Secondary | ICD-10-CM

## 2012-12-06 DIAGNOSIS — E162 Hypoglycemia, unspecified: Secondary | ICD-10-CM | POA: Diagnosis present

## 2012-12-06 DIAGNOSIS — J9600 Acute respiratory failure, unspecified whether with hypoxia or hypercapnia: Principal | ICD-10-CM | POA: Diagnosis present

## 2012-12-06 DIAGNOSIS — E11649 Type 2 diabetes mellitus with hypoglycemia without coma: Secondary | ICD-10-CM | POA: Diagnosis present

## 2012-12-06 DIAGNOSIS — E119 Type 2 diabetes mellitus without complications: Secondary | ICD-10-CM

## 2012-12-06 DIAGNOSIS — R579 Shock, unspecified: Secondary | ICD-10-CM | POA: Diagnosis present

## 2012-12-06 DIAGNOSIS — I6782 Cerebral ischemia: Secondary | ICD-10-CM

## 2012-12-06 DIAGNOSIS — I469 Cardiac arrest, cause unspecified: Secondary | ICD-10-CM | POA: Diagnosis present

## 2012-12-06 DIAGNOSIS — F1010 Alcohol abuse, uncomplicated: Secondary | ICD-10-CM | POA: Diagnosis present

## 2012-12-06 DIAGNOSIS — E669 Obesity, unspecified: Secondary | ICD-10-CM | POA: Diagnosis present

## 2012-12-06 DIAGNOSIS — Z79899 Other long term (current) drug therapy: Secondary | ICD-10-CM

## 2012-12-06 DIAGNOSIS — M129 Arthropathy, unspecified: Secondary | ICD-10-CM

## 2012-12-06 DIAGNOSIS — I498 Other specified cardiac arrhythmias: Secondary | ICD-10-CM | POA: Diagnosis not present

## 2012-12-06 DIAGNOSIS — N289 Disorder of kidney and ureter, unspecified: Secondary | ICD-10-CM | POA: Diagnosis present

## 2012-12-06 DIAGNOSIS — R509 Fever, unspecified: Secondary | ICD-10-CM | POA: Diagnosis not present

## 2012-12-06 DIAGNOSIS — Z7982 Long term (current) use of aspirin: Secondary | ICD-10-CM

## 2012-12-06 DIAGNOSIS — I10 Essential (primary) hypertension: Secondary | ICD-10-CM | POA: Diagnosis present

## 2012-12-06 DIAGNOSIS — E1169 Type 2 diabetes mellitus with other specified complication: Secondary | ICD-10-CM | POA: Diagnosis present

## 2012-12-06 HISTORY — DX: Anxiety disorder, unspecified: F41.9

## 2012-12-06 HISTORY — DX: Disorder of kidney and ureter, unspecified: N28.9

## 2012-12-06 HISTORY — DX: Cardiac arrest, cause unspecified: I46.9

## 2012-12-06 LAB — CREATININE, SERUM
Creatinine, Ser: 1.07 mg/dL (ref 0.50–1.35)
GFR calc Af Amer: >90 mL/min (ref >90)
GFR calc non Af Amer: 80 mL/min — ABNORMAL LOW (ref >90)

## 2012-12-06 LAB — POCT I-STAT 3, ART BLOOD GAS (G3+)
Acid-base deficit: 3 mmol/L — ABNORMAL HIGH (ref 0.0–2.0)
Bicarbonate: 21.7 mEq/L (ref 20.0–24.0)
Patient temperature: 98.7
TCO2: 24 mmol/L (ref 0–100)
pCO2 arterial: 39.7 mmHg (ref 35.0–45.0)
pCO2 arterial: 28.7 mmHg — ABNORMAL LOW (ref 35.0–45.0)
pH, Arterial: 7.361 (ref 7.350–7.450)
pH, Arterial: 7.467 — ABNORMAL HIGH (ref 7.350–7.450)
pO2, Arterial: 90 mmHg (ref 80.0–100.0)

## 2012-12-06 LAB — POCT I-STAT, CHEM 8
BUN: 6 mg/dL (ref 6–23)
Chloride: 108 mEq/L (ref 96–112)
Creatinine, Ser: 1.3 mg/dL (ref 0.50–1.35)
Creatinine, Ser: 1.2 mg/dL (ref 0.50–1.35)
Creatinine, Ser: 1.2 mg/dL (ref 0.50–1.35)
Glucose, Bld: 110 mg/dL — ABNORMAL HIGH (ref 70–99)
Glucose, Bld: 44 mg/dL — CL (ref 70–99)
HCT: 32 % — ABNORMAL LOW (ref 39.0–52.0)
HCT: 30 % — ABNORMAL LOW (ref 39.0–52.0)
HCT: 30 % — ABNORMAL LOW (ref 39.0–52.0)
Hemoglobin: 10.9 g/dL — ABNORMAL LOW (ref 13.0–17.0)
Hemoglobin: 10.2 g/dL — ABNORMAL LOW (ref 13.0–17.0)
Hemoglobin: 10.2 g/dL — ABNORMAL LOW (ref 13.0–17.0)
Potassium: 3.3 mEq/L — ABNORMAL LOW (ref 3.5–5.1)
Potassium: 3.6 mEq/L (ref 3.5–5.1)
Potassium: 3.4 mEq/L — ABNORMAL LOW (ref 3.5–5.1)
Sodium: 143 mEq/L (ref 135–145)
Sodium: 143 mEq/L (ref 135–145)
Sodium: 143 mEq/L (ref 135–145)
Sodium: 145 mEq/L (ref 135–145)
TCO2: 23 mmol/L (ref 0–100)
TCO2: 22 mmol/L (ref 0–100)
TCO2: 23 mmol/L (ref 0–100)

## 2012-12-06 LAB — COMPREHENSIVE METABOLIC PANEL
ALT: 50 U/L (ref 0–53)
AST: 89 U/L — ABNORMAL HIGH (ref 0–37)
Albumin: 2.7 g/dL — ABNORMAL LOW (ref 3.5–5.2)
Alkaline Phosphatase: 103 U/L (ref 39–117)
BUN: 6 mg/dL (ref 6–23)
CO2: 21 mEq/L (ref 19–32)
Calcium: 8.1 mg/dL — ABNORMAL LOW (ref 8.4–10.5)
Chloride: 104 mEq/L (ref 96–112)
Creatinine, Ser: 1.21 mg/dL (ref 0.50–1.35)
GFR calc Af Amer: 80 mL/min — ABNORMAL LOW (ref >90)
GFR calc non Af Amer: 69 mL/min — ABNORMAL LOW (ref >90)
Glucose, Bld: 61 mg/dL — ABNORMAL LOW (ref 70–99)
Potassium: 3.7 mEq/L (ref 3.5–5.1)
Sodium: 141 mEq/L (ref 135–145)
Total Bilirubin: 0.2 mg/dL — ABNORMAL LOW (ref 0.3–1.2)
Total Protein: 6.6 g/dL (ref 6.0–8.3)

## 2012-12-06 LAB — CBC
HCT: 26.5 % — ABNORMAL LOW (ref 39.0–52.0)
Hemoglobin: 8.6 g/dL — ABNORMAL LOW (ref 13.0–17.0)
MCH: 28.3 pg (ref 26.0–34.0)
MCHC: 32.5 g/dL (ref 30.0–36.0)
MCV: 87.2 fL (ref 78.0–100.0)
Platelets: 417 10*3/uL — ABNORMAL HIGH (ref 150–400)
RBC: 3.04 MIL/uL — ABNORMAL LOW (ref 4.22–5.81)
RDW: 16.5 % — ABNORMAL HIGH (ref 11.5–15.5)
WBC: 6.3 10*3/uL (ref 4.0–10.5)

## 2012-12-06 LAB — RAPID URINE DRUG SCREEN, HOSP PERFORMED
Amphetamines: NOT DETECTED
Barbiturates: NOT DETECTED
Benzodiazepines: NOT DETECTED
Cocaine: NOT DETECTED
Opiates: NOT DETECTED
Tetrahydrocannabinol: NOT DETECTED

## 2012-12-06 LAB — ETHANOL: Alcohol, Ethyl (B): 278 mg/dL — ABNORMAL HIGH (ref 0–11)

## 2012-12-06 LAB — URINALYSIS, ROUTINE W REFLEX MICROSCOPIC
Bilirubin Urine: NEGATIVE
Glucose, UA: NEGATIVE mg/dL
Ketones, ur: NEGATIVE mg/dL
Leukocytes, UA: NEGATIVE
Nitrite: NEGATIVE
Protein, ur: 100 mg/dL — AB
Specific Gravity, Urine: 1.011 (ref 1.005–1.030)
Urobilinogen, UA: 1 mg/dL (ref 0.0–1.0)
pH: 5.5 (ref 5.0–8.0)

## 2012-12-06 LAB — GLUCOSE, CAPILLARY
Glucose-Capillary: 80 mg/dL (ref 70–99)
Glucose-Capillary: 93 mg/dL (ref 70–99)
Glucose-Capillary: 85 mg/dL (ref 70–99)
Glucose-Capillary: 79 mg/dL (ref 70–99)

## 2012-12-06 LAB — MAGNESIUM: Magnesium: 1.8 mg/dL (ref 1.5–2.5)

## 2012-12-06 LAB — TROPONIN I
Troponin I: <0.3 ng/mL (ref <0.30)
Troponin I: <0.3 ng/mL (ref <0.30)

## 2012-12-06 LAB — URINE MICROSCOPIC-ADD ON

## 2012-12-06 LAB — PROTIME-INR: Prothrombin Time: 13.1 seconds (ref 11.6–15.2)

## 2012-12-06 LAB — TYPE AND SCREEN
ABO/RH(D): O POS
Antibody Screen: NEGATIVE

## 2012-12-06 LAB — PHOSPHORUS: Phosphorus: 6.1 mg/dL — ABNORMAL HIGH (ref 2.3–4.6)

## 2012-12-06 LAB — LACTIC ACID, PLASMA: Lactic Acid, Venous: 5.8 mmol/L — ABNORMAL HIGH (ref 0.5–2.2)

## 2012-12-06 LAB — MRSA PCR SCREENING: MRSA by PCR: NEGATIVE

## 2012-12-06 LAB — CORTISOL: Cortisol, Plasma: 25.4 ug/dL

## 2012-12-06 MED ORDER — NOREPINEPHRINE BITARTRATE 1 MG/ML IJ SOLN
0.5000 ug/min | INTRAMUSCULAR | Status: DC
  Filled 2012-12-06: qty 4

## 2012-12-06 MED ORDER — ARTIFICIAL TEARS OP OINT
1.0000 "application " | TOPICAL_OINTMENT | Freq: Three times a day (TID) | OPHTHALMIC | Status: DC
  Administered 2012-12-06 – 2012-12-08 (×5): 1 via OPHTHALMIC
  Filled 2012-12-06: qty 3.5

## 2012-12-06 MED ORDER — SUCCINYLCHOLINE CHLORIDE 20 MG/ML IJ SOLN
INTRAMUSCULAR | Status: AC
  Administered 2012-12-06: 150 mg
  Filled 2012-12-06: qty 1

## 2012-12-06 MED ORDER — ASPIRIN 300 MG RE SUPP
300.0000 mg | RECTAL | Status: AC
  Administered 2012-12-06: 300 mg via RECTAL
  Filled 2012-12-06: qty 1

## 2012-12-06 MED ORDER — SODIUM CHLORIDE 0.9 % IV BOLUS (SEPSIS)
1000.0000 mL | Freq: Once | INTRAVENOUS | Status: AC
  Administered 2012-12-06: 1000 mL via INTRAVENOUS

## 2012-12-06 MED ORDER — SODIUM CHLORIDE 0.9 % IV SOLN
INTRAVENOUS | Status: DC
  Administered 2012-12-06: 14:00:00 via INTRAVENOUS

## 2012-12-06 MED ORDER — SODIUM CHLORIDE 0.9 % IV SOLN: 2000.0000 mL | Freq: Once | INTRAVENOUS | Status: DC

## 2012-12-06 MED ORDER — SODIUM CHLORIDE 0.9 % IV SOLN
25.0000 ug/h | INTRAVENOUS | Status: DC
  Administered 2012-12-06: 50 ug/h via INTRAVENOUS
  Filled 2012-12-06: qty 50

## 2012-12-06 MED ORDER — DEXTROSE 50 % IV SOLN
INTRAVENOUS | Status: AC
  Administered 2012-12-06: 12:00:00
  Filled 2012-12-06: qty 50

## 2012-12-06 MED ORDER — SODIUM CHLORIDE 0.9 % IV SOLN
2000.0000 mL | Freq: Once | INTRAVENOUS | Status: AC
  Administered 2012-12-06: 2000 mL via INTRAVENOUS

## 2012-12-06 MED ORDER — HEPARIN SODIUM (PORCINE) 5000 UNIT/ML IJ SOLN
5000.0000 [IU] | Freq: Three times a day (TID) | INTRAMUSCULAR | Status: DC
  Filled 2012-12-06 (×2): qty 1

## 2012-12-06 MED ORDER — THIAMINE HCL 100 MG/ML IJ SOLN
100.0000 mg | Freq: Every day | INTRAMUSCULAR | Status: DC
  Administered 2012-12-06: 100 mg via INTRAVENOUS
  Administered 2012-12-07: 10:00:00 via INTRAVENOUS
  Filled 2012-12-06 (×3): qty 1

## 2012-12-06 MED ORDER — SODIUM CHLORIDE 0.9 % IV SOLN
1.0000 mg/h | INTRAVENOUS | Status: DC
  Administered 2012-12-06: 2 mg/h via INTRAVENOUS
  Filled 2012-12-06: qty 10

## 2012-12-06 MED ORDER — LIDOCAINE HCL (CARDIAC) 20 MG/ML IV SOLN
INTRAVENOUS | Status: AC
  Filled 2012-12-06: qty 5

## 2012-12-06 MED ORDER — FOLIC ACID 5 MG/ML IJ SOLN
1.0000 mg | Freq: Every day | INTRAMUSCULAR | Status: DC
  Administered 2012-12-06 – 2012-12-07 (×2): 1 mg via INTRAVENOUS
  Filled 2012-12-06 (×3): qty 0.2

## 2012-12-06 MED ORDER — DEXTROSE 50 % IV SOLN
INTRAVENOUS | Status: AC
  Administered 2012-12-06: 50 mL via INTRAVENOUS
  Filled 2012-12-06: qty 50

## 2012-12-06 MED ORDER — SODIUM CHLORIDE 0.9 % IV SOLN
25.0000 ug/h | INTRAVENOUS | Status: DC
  Administered 2012-12-07: 300 ug/h via INTRAVENOUS
  Administered 2012-12-07: 250 ug/h via INTRAVENOUS
  Administered 2012-12-08: 300 ug/h via INTRAVENOUS
  Filled 2012-12-06 (×5): qty 50

## 2012-12-06 MED ORDER — DEXTROSE 10 % IV SOLN
INTRAVENOUS | Status: DC
  Administered 2012-12-06 – 2012-12-07 (×3): via INTRAVENOUS

## 2012-12-06 MED ORDER — ETOMIDATE 2 MG/ML IV SOLN
INTRAVENOUS | Status: AC
  Administered 2012-12-06: 30 mg
  Filled 2012-12-06: qty 20

## 2012-12-06 MED ORDER — POTASSIUM CHLORIDE 10 MEQ/50ML IV SOLN
10.0000 meq | INTRAVENOUS | Status: AC
  Administered 2012-12-06 (×2): 10 meq via INTRAVENOUS
  Filled 2012-12-06: qty 100

## 2012-12-06 MED ORDER — SODIUM CHLORIDE 0.9 % IV SOLN: INTRAVENOUS | Status: DC

## 2012-12-06 MED ORDER — POTASSIUM CHLORIDE 10 MEQ/50ML IV SOLN
INTRAVENOUS | Status: AC
  Administered 2012-12-06: 10 meq via INTRAVENOUS
  Filled 2012-12-06: qty 50

## 2012-12-06 MED ORDER — FENTANYL BOLUS VIA INFUSION
25.0000 ug | Freq: Four times a day (QID) | INTRAVENOUS | Status: DC | PRN
  Administered 2012-12-07: 100 ug via INTRAVENOUS
  Filled 2012-12-06: qty 100

## 2012-12-06 MED ORDER — PANTOPRAZOLE SODIUM 40 MG IV SOLR
40.0000 mg | Freq: Two times a day (BID) | INTRAVENOUS | Status: DC
  Administered 2012-12-06 (×2): 40 mg via INTRAVENOUS
  Filled 2012-12-06 (×3): qty 40

## 2012-12-06 MED ORDER — SODIUM CHLORIDE 0.9 % IV SOLN
1.0000 mg/h | INTRAVENOUS | Status: DC
  Administered 2012-12-06: 7 mg/h via INTRAVENOUS
  Filled 2012-12-06 (×2): qty 10

## 2012-12-06 MED ORDER — ROCURONIUM BROMIDE 50 MG/5ML IV SOLN
INTRAVENOUS | Status: AC
  Filled 2012-12-06: qty 2

## 2012-12-06 MED ORDER — SODIUM CHLORIDE 0.9 % IV SOLN
1.0000 g | Freq: Once | INTRAVENOUS | Status: AC
  Administered 2012-12-06: 1 g via INTRAVENOUS
  Filled 2012-12-06 (×2): qty 10

## 2012-12-06 MED ORDER — SODIUM CHLORIDE 0.9 % IV SOLN
1.0000 ug/kg/min | INTRAVENOUS | Status: DC
  Administered 2012-12-06: 1 ug/kg/min via INTRAVENOUS
  Administered 2012-12-07: 1.5 ug/kg/min via INTRAVENOUS
  Filled 2012-12-06 (×2): qty 20

## 2012-12-06 MED ORDER — POTASSIUM CHLORIDE 10 MEQ/50ML IV SOLN
10.0000 meq | INTRAVENOUS | Status: AC
  Administered 2012-12-06 – 2012-12-07 (×3): 10 meq via INTRAVENOUS
  Filled 2012-12-06 (×3): qty 50

## 2012-12-06 MED ORDER — MIDAZOLAM BOLUS VIA INFUSION
1.0000 mg | INTRAVENOUS | Status: DC | PRN
  Filled 2012-12-06: qty 2

## 2012-12-06 NOTE — Progress Notes (Signed)
DVT prophylaxis SCDs; no heparin for now due to previous h/o GI bleed.   Hypocalcemia; Ca replaced   Hypokalemia; K replaced.

## 2012-12-06 NOTE — ED Notes (Signed)
Per EMS, un witnessed arrest CPR initiated with epi given and CPR about 4 min with return of pulses and NSR. Pt trying to breathe on his own.

## 2012-12-06 NOTE — ED Notes (Signed)
To CT with RN, RT and CR monitor, VSS

## 2012-12-06 NOTE — Procedures (Signed)
Arterial Catheter Insertion Procedure Note Jeffrey [name] [id number] 1966-03-26  Procedure: Insertion of Arterial Catheter  Indications: Blood pressure monitoring  Procedure Details Consent: Risks of procedure as well as the alternatives and risks of each were explained to the (patient/caregiver).  Consent for procedure obtained. Time Out: Verified patient identification, verified procedure, site/side was marked, verified correct patient position, special equipment/implants available, medications/allergies/relevent history reviewed, required imaging and test results available.  Performed  Maximum sterile technique was used including antiseptics. Skin prep: Chlorhexidine; local anesthetic administered 20 gauge catheter was inserted into left radial artery using the Seldinger technique.  Evaluation Blood flow good; BP tracing good. Complications: No apparent complications.   Revonda Standard 12/06/2012

## 2012-12-06 NOTE — Progress Notes (Signed)
Hypokalemia   K replaced;   Hypoglycemia;   D50 given by staff per protocol; initiate D10 at 50 cc/h; CBG q1hX2 then q2h.

## 2012-12-06 NOTE — ED Notes (Signed)
Critical care MD at bedside 

## 2012-12-06 NOTE — Progress Notes (Signed)
Met family after pt's arrival to ED and provided Chaplain care until pt's was moved to 2900. Pt's family was very tearful, wife presented very emotional. Wife told dr that pt did not drink much. Other family members (and friend) informed me that pt drank a lot. I reported their comments to nursing staff on 2900. After responding to another call, I llooked for family after pt was moved but could not find them. Ernest Haber Chaplain

## 2012-12-06 NOTE — Procedures (Signed)
Central Venous Catheter Insertion Procedure Note ABDULKAREEM GOODLY [id number] 1966-07-25  Procedure: Insertion of Central Venous Catheter Indications: Assessment of intravascular volume, Drug and/or fluid administration and Frequent blood sampling  Procedure Details Consent: Risks of procedure as well as the alternatives and risks of each were explained to the (patient/caregiver).  Consent for procedure obtained. Time Out: Verified patient identification, verified procedure, site/side was marked, verified correct patient position, special equipment/implants available, medications/allergies/relevent history reviewed, required imaging and test results available.  Performed  Maximum sterile technique was used including antiseptics, cap, gloves, gown, hand hygiene, mask and sheet. Skin prep: Chlorhexidine; local anesthetic administered A antimicrobial bonded/coated triple lumen catheter was placed in the right internal jugular vein using the Seldinger technique. Ultrasound guidance used.yes Catheter placed to 17 cm. Blood aspirated via all 3 ports and then flushed x 3. Line sutured x 2 and dressing applied.  Evaluation Blood flow good Complications: No apparent complications Patient did tolerate procedure well. Chest X-ray ordered to verify placement.  CXR: normal.  Richardson Landry Minor ACNP Maryanna Shape PCCM Pager [phone] till 3 pm If no answer page [phone]  Ultrasound used for site verification, live visualisation of needle entry & guidewire prior to dilation  Supervised by me CXR shows good position - OK to use  ALVA,RAKESH V.  12/06/2012, 12:35 PM

## 2012-12-06 NOTE — H&P (Signed)
PULMONARY  / CRITICAL CARE MEDICINE  Name: Jeffrey [name] MRN: [id number] DOB: Jul 25, 1966    ADMISSION DATE:  12/06/2012 PCP- Loyola Mast  REFERRING MD :  EDP  CHIEF COMPLAINT:VDRF  BRIEF PATIENT DESCRIPTION:  47 year old male brought in by EMS. EMS called by family because pt did not seem to be breathing Last seen normal when he went to bed last night but EMS not sure specifically what time that was. Shortly before EMS arrived patient's mother checked on him and he did not seem to be breathing. She did hear him snoring approximately 5 minutes before this though. On EMS arrival patient was without pulses and apneic. AED placed and did not recommend shock. King Airway placed and 4 minutes of chest compressions and epi x2 with ROSC. EDP replaced king airway with ETT. PCCM asked to admit to CCU for ? Hypothermia protocol. Note he was  DC'd from [facility] on 2-28 following tx for ETOH and GIB being followed by IMTS. Work up for [employer] was pending but likely has severe OSA by history CBG 80 by EMS, 60 in ED  SIGNIFICANT EVENTS / STUDIES:  3-2 ct head  LINES / TUBES: 3-2 ott>> 3-2 rt i j cvl>>   CULTURES: 3-2 bc x 2>> 3-2 sputum>> 3-2 uc>>  ANTIBIOTICS:   HISTORY OF PRESENT ILLNESS:   47 year old male brought in by EMS. EMS called by family because pt did not seem to be breathing Last seen normal when he went to bed last night but EMS not sure specifically what time that was. Shortly before EMS arrived patient's mother checked on him and he did not seem to be breathing. She did hear him snoring approximately 5 minutes before this though. On EMS arrival patient was without pulses and apneic. AED placed and did not recommend shock. King Airway placed and 4 minutes of chest compressions and epi x2 with ROSC. EDP replaced king airway with ETT. PCCM asked to admit to CCU for ? Hypothermia protocol. Note he was  DC'd from [facility] on 2-28 following tx for ETOH and GIB. PAST MEDICAL HISTORY :  Past Medical  History  Diagnosis Date  . Hypertension   . Blood transfusion   . Diabetes mellitus   . Venous insufficiency 12/13/2011   Past Surgical History  Procedure Laterality Date  . Knee arthroscopy    . Esophagogastroduodenoscopy Left 11/26/2012    Procedure: ESOPHAGOGASTRODUODENOSCOPY (EGD);  Surgeon: Wonda Horner, MD;  Location: [facility] ENDOSCOPY;  Service: Endoscopy;  Laterality: Left;   Prior to Admission medications   Medication Sig Start Date End Date Taking? Authorizing Provider  acetaminophen (TYLENOL) 325 MG tablet Take 2 tablets (650 mg total) by mouth every 6 (six) hours as needed. 11/28/12   Gerda Diss, DO  amLODipine (NORVASC) 5 MG tablet Take 1 tablet (5 mg total) by mouth daily. 11/26/11   Gerda Diss, DO  aspirin (ADULT ASPIRIN EC LOW STRENGTH) 81 MG EC tablet Take 1 tablet (81 mg total) by mouth daily. 08/21/11   Sibyl Parr, MD  furosemide (LASIX) 40 MG tablet Take 0.5 tablets (20 mg total) by mouth daily. 11/26/11   Gerda Diss, DO  insulin aspart (NOVOLOG FLEXPEN) 100 UNIT/ML injection Inject 5-10 units before meals as directed by your sliding scale. 08/21/11   Sibyl Parr, MD  insulin glargine (LANTUS) 100 UNIT/ML injection Inject 70 Units into the skin daily.    Historical Provider, MD  losartan (COZAAR) 50 MG tablet Take 1 tablet (50  mg total) by mouth daily. 11/26/11   Gerda Diss, DO  metoprolol succinate (TOPROL-XL) 100 MG 24 hr tablet Take 1 tablet (100 mg total) by mouth daily. 11/28/12   Cletus Gash, MD  pravastatin (PRAVACHOL) 80 MG tablet Take 1 tablet (80 mg total) by mouth at bedtime. for Cholesterol 11/26/11   Gerda Diss, DO  sildenafil (VIAGRA) 100 MG tablet Take 1 tablet (100 mg total) by mouth as needed for erectile dysfunction. Take 1 tablet 30 minutes to 4 hours before intercourse 11/26/11   Gerda Diss, DO  traZODone (DESYREL) 50 MG tablet Take 50 mg by mouth at bedtime.    Historical Provider, MD   Allergies  Allergen Reactions  .  Lisinopril     Pt reports nose bleed.    FAMILY HISTORY:  History reviewed. No pertinent family history. SOCIAL HISTORY:  reports that he has never smoked. He does not have any smokeless tobacco history on file. He reports that he drinks about 8.4 ounces of alcohol per week. He reports that he does not use illicit drugs.  REVIEW OF SYSTEMS:  NA  SUBJECTIVE:  Sedated on vent VITAL SIGNS: FiO2 (%):  [100 %] 100 % (03/02 1048) Weight:  [124 kg (273 lb 5.9 oz)] 124 kg (273 lb 5.9 oz) (03/02 1100) HEMODYNAMICS:   VENTILATOR SETTINGS: Vent Mode:  [-] PRVC FiO2 (%):  [100 %] 100 % Set Rate:  [15 bmp] 15 bmp Vt Set:  [600 mL] 600 mL PEEP:  [5 cmH20] 5 cmH20 Plateau Pressure:  [24 cmH20] 24 cmH20 INTAKE / OUTPUT: Intake/Output   None     PHYSICAL EXAMINATION: General:  MO AAM  Neuro: Sedated and NMB HEENT: Disconjugate gaze Cardiovascular:  HSR RRR Lungs: Coarse rhonchi Abdomen: obese  Musculoskeletal: intact Skin:  cool  LABS:  Recent Labs Lab 12/06/12 1047  HGB 8.6*  WBC 6.3  PLT 417*   No results found for this basename: GLUCAP,  in the last 168 hours  Imaging: Ct Head Wo Contrast  12/06/2012  *RADIOLOGY REPORT*  Clinical Data: Unwitnessed arrest  CT HEAD WITHOUT CONTRAST  Technique:  Contiguous axial images were obtained from the base of the skull through the vertex without contrast.  Comparison: 11/19/2010  Findings: No evidence of parenchymal hemorrhage or extra-axial fluid collection. No mass lesion, mass effect, or midline shift.  No CT evidence of acute infarction.  Cerebral volume is age appropriate.  No ventriculomegaly.  Mucosal thickening/partial opacification of the bilateral ethmoid and maxillary sinuses.  The mastoid air cells are clear.  No evidence of calvarial fracture.  IMPRESSION: No evidence of acute intracranial abnormality.   Original Report Authenticated By: Julian Hy, M.D.    Dg Chest Portable 1 View  12/06/2012  *RADIOLOGY REPORT*   Clinical Data: Respiratory arrest, intubated  PORTABLE CHEST - 1 VIEW  Comparison: 11/19/2010  Findings: Endotracheal tube terminates 3 cm above the carina.  Cardiomegaly with mild interstitial edema.  No pleural effusion or pneumothorax.  Enteric tube is looped in the stomach.  IMPRESSION: Endotracheal tube terminates 3 cm above the carina.  Cardiomegaly with mild interstitial edema.   Original Report Authenticated By: Julian Hy, M.D.      ASSESSMENT / PLAN:  PULMONARY A:VDRF secondary to resp/cardiac arrest. P:   Vent as needed  CARDIOVASCULAR A: Post asystolic arrest, Unknown down time but heard snoring 5 minutes prior to being found unresponsive. Suspect resp arrest secondary to etoh abuse and severe OSA with ? Mild hypoglycemia. Shock P:  admit to icu Check cardiac enzymes  Cads consult Pressors as needed   RENAL Lab Results  Component Value Date   CREATININE 1.21 12/06/2012   CREATININE 0.90 11/28/2012   CREATININE 1.31 11/25/2012   CREATININE 1.59* 09/02/2011   CREATININE 4.36* 10/17/2007    A: No acute issue P:   Monitor creatine  GASTROINTESTINAL A:  Hx of GIB P:   Bid ppi  HEMATOLOGIC A:  No acute issue but hx GIB with blood loss anemia P:  Monitor cbc  INFECTIOUS A:  No acute issue P:   Pan culture for completness  ENDOCRINE A: DM and hypoglycemia P:   SSI D50 as needed  NEUROLOGIC A: Decreased LOC post PEA arrest, head CT neg P:   Given young age & likely anoxic injury, will proceed with hypothermia protocol Avoid secondary insults - low threshold to stop if GI bleeding occurs next 48 h  TODAY'S SUMMARY: Given young age & likely anoxic injury, will proceed with hypothermia protocol eventhough this is PEA arrest. Updated wife & family-  She was very histrionic with collapsing on the floor when I mentioned 'life support' I explained the chances of anoxic brain injury but I am not sure she understood the concept. Will continue to discuss on  future conversations. They desire aggressive measures in the short term   Richardson Landry Minor ACNP Maryanna Shape PCCM Pager [phone] till 3 pm If no answer page [phone] 12/06/2012, 12:15 PM  I have personally obtained a history, examined the patient, evaluated laboratory and imaging results, formulated the assessment and plan and placed orders. CRITICAL CARE: The patient is critically ill with multiple organ systems failure and requires high complexity decision making for assessment and support, frequent evaluation and titration of therapies, application of advanced monitoring technologies and extensive interpretation of multiple databases. Critical Care Time devoted to patient care services described in this note is 60 minutes.    ALVA,RAKESH V.

## 2012-12-06 NOTE — ED Notes (Signed)
CBG 83 

## 2012-12-06 NOTE — ED Notes (Addendum)
HYPOTHERMIA RESTARTED PER DR. Elsworth Soho. PADS APPLIED AND COLD SALINE RESTARTED. 2 NS BOLUSES INFUSING

## 2012-12-06 NOTE — ED Provider Notes (Signed)
History    47 year old male brought in by EMS. EMS called by family because pt did not seem to be breathing. Last seen normal when he went to bed last night but EMS not sure specifically what time that was. Shortly before EMS arrived patient's mother checked on him and he did not seem to be breathing. She did hear him snoring approximately 5 minutes before this though. On EMS arrival patient was without pulses and apneic. AED placed and did not recommend shock. King Airway placed and 4 minutes of chest compressions and epi x2 with ROSC.  On arrival to ED pt being bagged but some spontaneous respiratory effort noted. GCS 3T. CBG check on arrival and in 61s. Appeared to be sinus rhythm on monitor with rate 70-80. Per review of records recent admit with GI bleed requiring transfusion. No report of trauma or ingestion although has hx of ETOH abuse.  CSN: [id number]  Arrival date & time 12/06/12  1026   First MD Initiated Contact with Patient 12/06/12 1038      Chief Complaint  Patient presents with  . Cardiac Arrest    (Consider location/radiation/quality/duration/timing/severity/associated sxs/prior treatment) HPI  Past Medical History  Diagnosis Date  . Hypertension   . Blood transfusion   . Diabetes mellitus   . Venous insufficiency 12/13/2011    Past Surgical History  Procedure Laterality Date  . Knee arthroscopy    . Esophagogastroduodenoscopy Left 11/26/2012    Procedure: ESOPHAGOGASTRODUODENOSCOPY (EGD);  Surgeon: Wonda Horner, MD;  Location: [facility] ENDOSCOPY;  Service: Endoscopy;  Laterality: Left;    History reviewed. No pertinent family history.  History  Substance Use Topics  . Smoking status: Never Smoker   . Smokeless tobacco: Not on file  . Alcohol Use: 8.4 oz/week    14 Cans of beer per week     Comment: couple cans day      Review of Systems  Level 5 caveat applies because pt is unresponsive.  Allergies  Lisinopril  Home Medications   Current Outpatient Rx   Name  Route  Sig  Dispense  Refill  . acetaminophen (TYLENOL) 325 MG tablet   Oral   Take 2 tablets (650 mg total) by mouth every 6 (six) hours as needed.         [name] amLODipine (NORVASC) 5 MG tablet   Oral   Take 1 tablet (5 mg total) by mouth daily.   90 tablet   3   . aspirin (ADULT ASPIRIN EC LOW STRENGTH) 81 MG EC tablet   Oral   Take 1 tablet (81 mg total) by mouth daily.   30 tablet   0   . furosemide (LASIX) 40 MG tablet   Oral   Take 0.5 tablets (20 mg total) by mouth daily.   90 tablet   3   . insulin aspart (NOVOLOG FLEXPEN) 100 UNIT/ML injection      Inject 5-10 units before meals as directed by your sliding scale.   1 vial   12   . insulin glargine (LANTUS) 100 UNIT/ML injection   Subcutaneous   Inject 70 Units into the skin daily.         [name] losartan (COZAAR) 50 MG tablet   Oral   Take 1 tablet (50 mg total) by mouth daily.   90 tablet   3   . metoprolol succinate (TOPROL-XL) 100 MG 24 hr tablet   Oral   Take 1 tablet (100 mg total) by mouth daily.  30 tablet   0   . pravastatin (PRAVACHOL) 80 MG tablet   Oral   Take 1 tablet (80 mg total) by mouth at bedtime. for Cholesterol   90 tablet   3   . sildenafil (VIAGRA) 100 MG tablet   Oral   Take 1 tablet (100 mg total) by mouth as needed for erectile dysfunction. Take 1 tablet 30 minutes to 4 hours before intercourse   30 tablet   3   . traZODone (DESYREL) 50 MG tablet   Oral   Take 50 mg by mouth at bedtime.           There were no vitals taken for this visit.  Physical Exam  Constitutional:  Obese. No obvious external signs of trauma.  HENT:  Head: Normocephalic and atraumatic.  Eyes: Pupils are equal, round, and reactive to light. Right eye exhibits no discharge. Left eye exhibits no discharge.  B/l chemosis  Cardiovascular: Normal rate, regular rhythm and normal heart sounds.   Pulmonary/Chest: Breath sounds normal.  King Airway. Easily bagged. B/l breath sounds.  Occasional spontaneous respiratory effort.   Abdominal: Soft. He exhibits distension.  Musculoskeletal:  IO L tibia  Neurological:  GCS 3T  Skin: Skin is warm and dry.    ED Course  INTUBATION Date/Time: 12/06/2012 11:16 AM Performed by: Virgel Manifold Authorized by: Virgel Manifold Consent: The procedure was performed in an emergent situation. Indications: respiratory failure and airway protection Intubation method: video-assisted Patient status: paralyzed (RSI) Preoxygenation: king airway. Sedatives: etomidate Paralytic: succinylcholine Tube size: 8.0 mm Tube type: cuffed Number of attempts: 1 Cricoid pressure: no Cords visualized: yes Post-procedure assessment: chest rise and CO2 detector Breath sounds: equal and absent over the epigastrium Cuff inflated: yes ETT to lip: 26 cm Tube secured with: ETT holder Chest x-ray interpreted by me. Chest x-ray findings: endotracheal tube in appropriate position Patient tolerance: Patient tolerated the procedure well with no immediate complications.    CRITICAL CARE Performed by: Virgel Manifold   Total critical care time: 40 minutes  Critical care time was exclusive of separately billable procedures and treating other patients.  Critical care was necessary to treat or prevent imminent or life-threatening deterioration.  Critical care was time spent personally by me on the following activities: development of treatment plan with patient and/or surrogate as well as nursing, discussions with consultants, evaluation of patient's response to treatment, examination of patient, obtaining history from patient or surrogate, ordering and performing treatments and interventions, ordering and review of laboratory studies, ordering and review of radiographic studies, pulse oximetry and re-evaluation of patient's condition.   (including critical care time)  Labs Reviewed  CBC - Abnormal; Notable for the following:    RBC 3.04 (*)     Hemoglobin 8.6 (*)    HCT 26.5 (*)    RDW 16.5 (*)    Platelets 417 (*)    All other components within normal limits  COMPREHENSIVE METABOLIC PANEL - Abnormal; Notable for the following:    Glucose, Bld 61 (*)    Calcium 8.1 (*)    Albumin 2.7 (*)    AST 89 (*)    Total Bilirubin 0.2 (*)    GFR calc non Af Amer 69 (*)    GFR calc Af Amer 80 (*)    All other components within normal limits  URINALYSIS, ROUTINE W REFLEX MICROSCOPIC - Abnormal; Notable for the following:    APPearance CLOUDY (*)    Hgb urine dipstick TRACE (*)  Protein, ur 100 (*)    All other components within normal limits  LACTIC ACID, PLASMA - Abnormal; Notable for the following:    Lactic Acid, Venous 5.8 (*)    All other components within normal limits  PHOSPHORUS - Abnormal; Notable for the following:    Phosphorus 6.1 (*)    All other components within normal limits  ETHANOL - Abnormal; Notable for the following:    Alcohol, Ethyl (B) 278 (*)    All other components within normal limits  CULTURE, BLOOD (ROUTINE X 2)  CULTURE, BLOOD (ROUTINE X 2)  TROPONIN I  MAGNESIUM  URINE MICROSCOPIC-ADD ON  URINALYSIS, ROUTINE W REFLEX MICROSCOPIC  URINE RAPID DRUG SCREEN (HOSP PERFORMED)  TYPE AND SCREEN   Ct Head Wo Contrast  12/06/2012  *RADIOLOGY REPORT*  Clinical Data: Unwitnessed arrest  CT HEAD WITHOUT CONTRAST  Technique:  Contiguous axial images were obtained from the base of the skull through the vertex without contrast.  Comparison: 11/19/2010  Findings: No evidence of parenchymal hemorrhage or extra-axial fluid collection. No mass lesion, mass effect, or midline shift.  No CT evidence of acute infarction.  Cerebral volume is age appropriate.  No ventriculomegaly.  Mucosal thickening/partial opacification of the bilateral ethmoid and maxillary sinuses.  The mastoid air cells are clear.  No evidence of calvarial fracture.  IMPRESSION: No evidence of acute intracranial abnormality.   Original Report  Authenticated By: Julian Hy, M.D.    Dg Chest Portable 1 View  12/06/2012  *RADIOLOGY REPORT*  Clinical Data: Respiratory arrest, intubated  PORTABLE CHEST - 1 VIEW  Comparison: 11/19/2010  Findings: Endotracheal tube terminates 3 cm above the carina.  Cardiomegaly with mild interstitial edema.  No pleural effusion or pneumothorax.  Enteric tube is looped in the stomach.  IMPRESSION: Endotracheal tube terminates 3 cm above the carina.  Cardiomegaly with mild interstitial edema.   Original Report Authenticated By: Julian Hy, M.D.     EKG:  Rhythm: normal sinus Rate: 72 Axis: normal Intervals: QTc prolonged at 525 ms ST segments: NS ST changes in III, aVF   1. Respiratory arrest   2. Cardiac arrest   3. Prolonged Q-T interval on ECG       MDM  46yM with unwitnessed arrest. ROSC prior to arrival. GCS 3T. Glucose normal. No report of trauma. No obvious signs on exam. No report of possible ingestion. Recent admit for GI bleed, but normotensive with HR in 70s. No obvious blood on external exam. EKG with NSR. QTc 530ms. No overt ischemic changes. Airway changed to cuffed ETT. Discussed with CCM who recommending hypothermia protocol without obvious contraindication at this time.         Virgel Manifold, MD 12/06/12 1630

## 2012-12-07 ENCOUNTER — Inpatient Hospital Stay (HOSPITAL_COMMUNITY): Payer: Managed Care, Other (non HMO)

## 2012-12-07 ENCOUNTER — Encounter (HOSPITAL_COMMUNITY): Payer: Self-pay | Admitting: Pulmonary Disease

## 2012-12-07 ENCOUNTER — Inpatient Hospital Stay: Payer: BC Managed Care – PPO | Admitting: Sports Medicine

## 2012-12-07 DIAGNOSIS — I469 Cardiac arrest, cause unspecified: Secondary | ICD-10-CM | POA: Diagnosis present

## 2012-12-07 DIAGNOSIS — R197 Diarrhea, unspecified: Secondary | ICD-10-CM

## 2012-12-07 DIAGNOSIS — E11649 Type 2 diabetes mellitus with hypoglycemia without coma: Secondary | ICD-10-CM | POA: Diagnosis present

## 2012-12-07 DIAGNOSIS — E162 Hypoglycemia, unspecified: Secondary | ICD-10-CM | POA: Diagnosis present

## 2012-12-07 DIAGNOSIS — G931 Anoxic brain damage, not elsewhere classified: Secondary | ICD-10-CM | POA: Diagnosis present

## 2012-12-07 DIAGNOSIS — R092 Respiratory arrest: Secondary | ICD-10-CM

## 2012-12-07 DIAGNOSIS — G4733 Obstructive sleep apnea (adult) (pediatric): Secondary | ICD-10-CM

## 2012-12-07 DIAGNOSIS — R0902 Hypoxemia: Secondary | ICD-10-CM

## 2012-12-07 DIAGNOSIS — E669 Obesity, unspecified: Secondary | ICD-10-CM | POA: Diagnosis present

## 2012-12-07 HISTORY — DX: Obstructive sleep apnea (adult) (pediatric): G47.33

## 2012-12-07 HISTORY — DX: Cardiac arrest, cause unspecified: I46.9

## 2012-12-07 LAB — BASIC METABOLIC PANEL
CO2: 24 mEq/L (ref 19–32)
Chloride: 110 mEq/L (ref 96–112)
Creatinine, Ser: 1 mg/dL (ref 0.50–1.35)
GFR calc Af Amer: >90 mL/min (ref >90)
GFR calc Af Amer: >90 mL/min (ref >90)
GFR calc non Af Amer: 83 mL/min — ABNORMAL LOW (ref >90)
Glucose, Bld: 102 mg/dL — ABNORMAL HIGH (ref 70–99)
Potassium: 3.1 mEq/L — ABNORMAL LOW (ref 3.5–5.1)
Potassium: 3.9 mEq/L (ref 3.5–5.1)
Sodium: 141 mEq/L (ref 135–145)
Sodium: 139 mEq/L (ref 135–145)

## 2012-12-07 LAB — POCT I-STAT, CHEM 8
BUN: 7 mg/dL (ref 6–23)
Calcium, Ion: 1.08 mmol/L — ABNORMAL LOW (ref 1.12–1.23)
Calcium, Ion: 1.1 mmol/L — ABNORMAL LOW (ref 1.12–1.23)
Calcium, Ion: 1.1 mmol/L — ABNORMAL LOW (ref 1.12–1.23)
Chloride: 108 mEq/L (ref 96–112)
Creatinine, Ser: 0.9 mg/dL (ref 0.50–1.35)
Creatinine, Ser: 1 mg/dL (ref 0.50–1.35)
Creatinine, Ser: 1.2 mg/dL (ref 0.50–1.35)
Glucose, Bld: 119 mg/dL — ABNORMAL HIGH (ref 70–99)
Glucose, Bld: 90 mg/dL (ref 70–99)
HCT: 28 % — ABNORMAL LOW (ref 39.0–52.0)
HCT: 27 % — ABNORMAL LOW (ref 39.0–52.0)
HCT: 29 % — ABNORMAL LOW (ref 39.0–52.0)
Hemoglobin: 9.9 g/dL — ABNORMAL LOW (ref 13.0–17.0)
Hemoglobin: 9.2 g/dL — ABNORMAL LOW (ref 13.0–17.0)
Hemoglobin: 9.9 g/dL — ABNORMAL LOW (ref 13.0–17.0)
Potassium: 3.1 mEq/L — ABNORMAL LOW (ref 3.5–5.1)
Potassium: 3.8 mEq/L (ref 3.5–5.1)
Potassium: 4.3 mEq/L (ref 3.5–5.1)
Sodium: 142 mEq/L (ref 135–145)
Sodium: 143 mEq/L (ref 135–145)
Sodium: 144 mEq/L (ref 135–145)
TCO2: 21 mmol/L (ref 0–100)
TCO2: 24 mmol/L (ref 0–100)

## 2012-12-07 LAB — CBC
Platelets: 371 10*3/uL (ref 150–400)
RBC: 3.15 MIL/uL — ABNORMAL LOW (ref 4.22–5.81)
RDW: 16.3 % — ABNORMAL HIGH (ref 11.5–15.5)
WBC: 9.8 10*3/uL (ref 4.0–10.5)

## 2012-12-07 LAB — GLUCOSE, CAPILLARY
Glucose-Capillary: 67 mg/dL — ABNORMAL LOW (ref 70–99)
Glucose-Capillary: 61 mg/dL — ABNORMAL LOW (ref 70–99)
Glucose-Capillary: 62 mg/dL — ABNORMAL LOW (ref 70–99)
Glucose-Capillary: 51 mg/dL — ABNORMAL LOW (ref 70–99)
Glucose-Capillary: 88 mg/dL (ref 70–99)
Glucose-Capillary: 76 mg/dL (ref 70–99)
Glucose-Capillary: 68 mg/dL — ABNORMAL LOW (ref 70–99)
Glucose-Capillary: 95 mg/dL (ref 70–99)
Glucose-Capillary: 108 mg/dL — ABNORMAL HIGH (ref 70–99)
Glucose-Capillary: 111 mg/dL — ABNORMAL HIGH (ref 70–99)
Glucose-Capillary: 110 mg/dL — ABNORMAL HIGH (ref 70–99)
Glucose-Capillary: 97 mg/dL (ref 70–99)
Glucose-Capillary: 75 mg/dL (ref 70–99)
Glucose-Capillary: 130 mg/dL — ABNORMAL HIGH (ref 70–99)

## 2012-12-07 LAB — BLOOD GAS, ARTERIAL
Acid-base deficit: 1.5 mmol/L (ref 0.0–2.0)
FIO2: 0.4 %
MECHVT: 600 mL
O2 Saturation: 92.8 %
RATE: 15 resp/min
TCO2: 24.2 mmol/L (ref 0–100)
pCO2 arterial: 33.5 mmHg — ABNORMAL LOW (ref 35.0–45.0)

## 2012-12-07 LAB — LEGIONELLA ANTIGEN, URINE

## 2012-12-07 LAB — TROPONIN I: Troponin I: <0.3 ng/mL (ref <0.30)

## 2012-12-07 LAB — URINE CULTURE
Colony Count: NO GROWTH
Culture: NO GROWTH

## 2012-12-07 MED ORDER — SODIUM CHLORIDE 0.9 % IV SOLN
1.0000 mg/h | INTRAVENOUS | Status: DC
  Administered 2012-12-07 (×2): 7 mg/h via INTRAVENOUS
  Filled 2012-12-07 (×5): qty 20

## 2012-12-07 MED ORDER — DEXTROSE 10 % IV SOLN
Freq: Once | INTRAVENOUS | Status: AC
  Administered 2012-12-07: 10:00:00 via INTRAVENOUS

## 2012-12-07 MED ORDER — POTASSIUM CHLORIDE 10 MEQ/50ML IV SOLN
10.0000 meq | INTRAVENOUS | Status: DC
  Administered 2012-12-07 (×4): 10 meq via INTRAVENOUS
  Filled 2012-12-07: qty 50
  Filled 2012-12-07: qty 200

## 2012-12-07 MED ORDER — CHLORHEXIDINE GLUCONATE 0.12 % MT SOLN
15.0000 mL | Freq: Two times a day (BID) | OROMUCOSAL | Status: DC
  Administered 2012-12-07 – 2012-12-08 (×2): 15 mL via OROMUCOSAL
  Filled 2012-12-07 (×2): qty 15

## 2012-12-07 MED ORDER — SODIUM CHLORIDE 4 MEQ/ML IV SOLN
INTRAVENOUS | Status: DC
  Administered 2012-12-07: 11:00:00 via INTRAVENOUS
  Filled 2012-12-07 (×3): qty 1000

## 2012-12-07 MED ORDER — BIOTENE DRY MOUTH MT LIQD
15.0000 mL | Freq: Four times a day (QID) | OROMUCOSAL | Status: DC
  Administered 2012-12-07: 15 mL via OROMUCOSAL

## 2012-12-07 MED ORDER — HEPARIN SODIUM (PORCINE) 5000 UNIT/ML IJ SOLN
5000.0000 [IU] | Freq: Three times a day (TID) | INTRAMUSCULAR | Status: DC
  Administered 2012-12-07 – 2012-12-14 (×20): 5000 [IU] via SUBCUTANEOUS
  Filled 2012-12-07 (×24): qty 1

## 2012-12-07 MED ORDER — SODIUM CHLORIDE 4 MEQ/ML IV SOLN
INTRAVENOUS | Status: DC
  Administered 2012-12-07 – 2012-12-08 (×2): via INTRAVENOUS
  Filled 2012-12-07 (×7): qty 1000

## 2012-12-07 MED ORDER — FAMOTIDINE 20 MG PO TABS
20.0000 mg | ORAL_TABLET | Freq: Two times a day (BID) | ORAL | Status: DC
  Filled 2012-12-07 (×2): qty 1

## 2012-12-07 MED ORDER — DEXTROSE 50 % IV SOLN
1.0000 | INTRAVENOUS | Status: DC | PRN
  Administered 2012-12-07 (×2): 50 mL via INTRAVENOUS
  Filled 2012-12-07 (×2): qty 50

## 2012-12-07 MED ORDER — CHLORHEXIDINE GLUCONATE 0.12 % MT SOLN
OROMUCOSAL | Status: AC
  Administered 2012-12-07: 15 mL via OROMUCOSAL
  Filled 2012-12-07: qty 15

## 2012-12-07 MED ORDER — SODIUM CHLORIDE 4 MEQ/ML IV SOLN: INTRAVENOUS | Status: DC

## 2012-12-07 MED ORDER — DEXTROSE 50 % IV SOLN
INTRAVENOUS | Status: AC
  Administered 2012-12-07: 25 mL via INTRAVENOUS
  Filled 2012-12-07: qty 50

## 2012-12-07 MED ORDER — DEXTROSE 50 % IV SOLN: 25.0000 mL | Freq: Once | INTRAVENOUS | Status: AC

## 2012-12-07 MED ORDER — FAMOTIDINE 20 MG PO TABS
20.0000 mg | ORAL_TABLET | Freq: Two times a day (BID) | ORAL | Status: DC
  Administered 2012-12-07 – 2012-12-08 (×2): 20 mg via ORAL
  Filled 2012-12-07 (×4): qty 1

## 2012-12-07 MED ORDER — BIOTENE DRY MOUTH MT LIQD
15.0000 mL | OROMUCOSAL | Status: DC
  Administered 2012-12-07 – 2012-12-08 (×7): 15 mL via OROMUCOSAL

## 2012-12-07 MED ORDER — MIDAZOLAM BOLUS VIA INFUSION
1.0000 mg | INTRAVENOUS | Status: DC | PRN
  Administered 2012-12-07: 2 mg via INTRAVENOUS
  Filled 2012-12-07: qty 2

## 2012-12-07 MED ORDER — SODIUM CHLORIDE 0.9 % IV SOLN
INTRAVENOUS | Status: DC
  Administered 2012-12-08: 10 mL/h via INTRAVENOUS

## 2012-12-07 MED FILL — Dextrose Inj 50%: INTRAVENOUS | Qty: 50 | Status: AC

## 2012-12-07 NOTE — Care Management Note (Addendum)
    Page 1 of 2   12/14/2012     5:00:45 PM   CARE MANAGEMENT NOTE 12/14/2012  Patient:  Jeffrey [name], Jeffrey [name]   Account Number:  [id number]  Date Initiated:  12/07/2012  Documentation initiated by:  Jeffrey [name]  Subjective/Objective Assessment:   adm w resp distress     Action/Plan:   lives w wife, pcp dr Jeffrey [name]   Anticipated DC Date:  12/11/2012   Anticipated DC Plan:  [place]  CM consult      Choice offered to / List presented to:     DME arranged  CPAP      DME agency  [facility]        Status of service:  Completed, signed off Medicare Important Message given?   (If response is "NO", the following Medicare IM given date fields will be blank) Date Medicare IM given:   Date Additional Medicare IM given:    Discharge Disposition:  HOME/SELF CARE  Per UR Regulation:  Reviewed for med. necessity/level of care/duration of stay  If discussed at [place] of Stay Meetings, dates discussed:    Comments:  12/14/12 Jeffrey AMERSON,RN,BSN [id number] [place] FAXED TO [facility] FOR CPAP; SLEEP STUDY SCHEDULED FOR MARCH 25 AT 8PM, WITH PCCM ASSISTANCE.   PER [facility], DME AGENCY WILL DELIVER CPAP TO PT'S HOME THIS EVENING BETWEEN 5-6 PM.  NOTIFIED DR Jeffrey [name] WITH THIS INFORMATION.   12/13/2012 7:02 PM  Notes for CPAP reviewed. NCM followed up with [facility] and they will notify NCM on 3/10 about CPAP. Jeffrey Finner RN CCM Case Mgmt phone [phone]   12-11-12 10am Jeffrey [name], RNBSN [phone] Talked with patient - states can't remember anything - permission given to talk with wife about discharge planning.  Called wife on home and cell phone and left messages to call back.  Patient will need CPap at home, sleep studies will need to be arranged within 30 days of discharge and also will need PT set up.  Waiting for wife to call back.  Still have not heard from wife - PT now saying will not need PT at home - still needs Cpap -  talked with PCCM. Will need to follow up in their office.  Unable to get him an appt as office is closed.  Will need to f/u with Jeffrey [name] at office to set up with sleep studies but she is not in also. Will need to do f/u with office on Monday and call him back with appointments.  Will just need to order DME - cpap machine on discharge.  PCCM - Jeffrey [name] was going to order settings.  Updated Dr. Caryl [name], dcing PT order.  Is ok with [name] getting sleep studies and appts set up Monday but has to have CPap on dc.  [facility] does not do [employer] - gave referral to [place].  Local office not open - infor given to main office who states will give message to local office and they will call us.  Informed are only delivering priority needs at this time.  3/3 0930 Jeffrey dowell rn,bsn

## 2012-12-07 NOTE — Progress Notes (Signed)
Spoke with Dr. Elsworth Soho about fluids running. Since rewarming started, protocol states to stop all sources of potassium.  Order received to continue running D10 1/2 NS + 40 KCL at current rate.

## 2012-12-07 NOTE — Progress Notes (Signed)
CBG decreased  To 61 after d10W increased.  Dr. Jimmy Footman aware.  D10W increased to 100 cc/hr.

## 2012-12-07 NOTE — Progress Notes (Signed)
CBG to Dr. Jimmy Footman.  Asked about giving D50W with D10W.  Orders rec'd to increase D10w to 75cc/hr.

## 2012-12-07 NOTE — Progress Notes (Signed)
[place] Progress Note Patient Name: Jeffrey [name] DOB: 1966-03-28 MRN: [id number]  Date of Service  12/07/2012   HPI/Events of Note   Hypoglycemia with blood sugars earlier in the 71s started on D10 infusion now with CBG in the 70s  eICU Interventions  Plan: Increase D10 infusion to 75 cc/hr      Elkin Belfield 12/07/2012, 3:08 AM

## 2012-12-07 NOTE — Progress Notes (Signed)
CBG decreased To 62 after d10W increased. Dr. Jimmy Footman aware. D10W increased to 125 cc/hr.

## 2012-12-07 NOTE — Progress Notes (Signed)
INITIAL NUTRITION ASSESSMENT  DOCUMENTATION CODES Per approved criteria  -Obesity Unspecified   INTERVENTION: 1. If EN warranted, recommend initiate Osmolite 1.2 @ 20 ml/hr and advance by 10 ml q 4 hr to a goal rate of 30 ml/hr. Add 60 ml pro-stat QID. At goal rate this EN regimen would provide 1664 kcal, 160 gm protein, and 590 ml free water.   2. RD will continue to follow    NUTRITION DIAGNOSIS: Inadequate oral intake related to inability to eat as evidenced by NPO diet.   Goal: Enteral nutrition to provide 60-70% of estimated calorie needs (22-25 kcals/kg ideal body weight) and 100% of estimated protein needs, based on ASPEN guidelines for permissive underfeeding in critically ill obese individuals.   Monitor:  Vent status, EN initiation, weight trends, I/O's, labs   Reason for Assessment: VRDF   47 y.o. male  Admitting Dx: VRDF  ASSESSMENT: Pt with recent admission for Etoh use and GIB, was found at home not breathing. Per notes pt was heard snoring 5 minutes before being found not breathing. Likely with severe OSA, started on hypothermia protocol on admission. Pt is currently still hypothermic.   Recommend if pt to remain intubated post rewarming, initiate enteral nutrition.   Height: Ht Readings from Last 1 Encounters:  12/06/12 6' (1.829 m)    Weight: Wt Readings from Last 1 Encounters:  12/07/12 279 lb 5.2 oz (126.7 kg)    Ideal Body Weight: 178 lbs   % Ideal Body Weight: 156%  Wt Readings from Last 10 Encounters:  12/07/12 279 lb 5.2 oz (126.7 kg)  11/26/12 274 lb 4 oz (124.4 kg)  11/26/12 274 lb 4 oz (124.4 kg)  12/02/11 275 lb (124.739 kg)  11/26/11 275 lb (124.739 kg)  09/02/11 260 lb 9.6 oz (118.207 kg)  08/20/11 266 lb 3.2 oz (120.748 kg)  06/27/11 282 lb 12.8 oz (128.277 kg)  12/10/10 277 lb 9.6 oz (125.919 kg)  11/23/10 277 lb (125.646 kg)    Usual Body Weight: ~275 lbs  % Usual Body Weight: 101%  BMI:  Body mass index is 37.87 kg/(m^2).  Obesity class 2   Patient is currently intubated on ventilator support.  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 and R upper arm  Diet Order: NPO  EDUCATION NEEDS: -No education needs identified at this time   Intake/Output Summary (Last 24 hours) at 12/07/12 1253 Last data filed at 12/07/12 1200  Gross per 24 hour  Intake 4333.1 ml  Output   2527 ml  Net 1806.1 ml    Last BM: PTA   Labs:   Recent Labs Lab 12/06/12 1047  12/07/12 0400 12/07/12 0451 12/07/12 0916  NA 141  < > 141 145 143  K 3.7  < > 3.1* 3.1* 3.5  CL 104  < > 110 108 108  CO2 21  --  24  --   --   BUN 6  < > 9 7 8   CREATININE 1.21  < > 1.00 1.00 1.00  CALCIUM 8.1*  --  7.2*  --   --   MG 1.8  --   --   --   --   PHOS 6.1*  --   --   --   --   GLUCOSE 61*  < >  74 68* 95  < > = values in this interval not displayed.  CBG (last 3)   Recent Labs  12/07/12 1007 12/07/12 1103 12/07/12 1202  GLUCAP 76 73 68*    Scheduled Meds: . sodium chloride  2,000 mL Intravenous Once  . antiseptic oral rinse  15 mL Mouth Rinse QID  . artificial tears  1 application Both Eyes [id number]  . chlorhexidine  15 mL Mouth/Throat BID  . famotidine  20 mg Oral BID  . folic acid  1 mg Intravenous Daily  . heparin subcutaneous  5,000 Units Subcutaneous Q8H  . thiamine  100 mg Intravenous Daily    Continuous Infusions: . sodium chloride 10 mL/hr at 12/07/12 0700  . sodium chloride 10 mL/hr at 12/07/12 1215  . cisatracurium (NIMBEX) infusion 1.5 mcg/kg/min (12/07/12 0537)  . D-10-0.45% Sodium Chloride with KCL 40 meq/L 1000 ml 125 mL/hr at 12/07/12 1119  . fentaNYL infusion INTRAVENOUS 300 mcg/hr (12/07/12 0900)  . midazolam (VERSED) infusion 7 mg/hr (12/07/12 0537)  . norepinephrine (LEVOPHED) Adult  infusion      Past Medical History  Diagnosis Date  . Hypertension   . Blood transfusion   . Diabetes mellitus   . Venous insufficiency 12/13/2011  . Cardiac arrest 12/07/2012  . OSA (obstructive sleep apnea) 12/07/2012  . Anxiety     Past Surgical History  Procedure Laterality Date  . Knee arthroscopy    . Esophagogastroduodenoscopy Left 11/26/2012    Procedure: ESOPHAGOGASTRODUODENOSCOPY (EGD);  Surgeon: Wonda Horner, MD;  Location: [facility] ENDOSCOPY;  Service: Endoscopy;  Laterality: Left;    Orson Slick RD, LDN Pager [phone] After Hours pager [phone]

## 2012-12-07 NOTE — Progress Notes (Signed)
[place] Progress Note Patient Name: CLYNTON SHANHOLTZ DOB: 10/20/1965 MRN: [id number]  Date of Service  12/07/2012   HPI/Events of Note  Hypokalemia   eICU Interventions  Potassium replaced   Intervention Category Intermediate Interventions: Electrolyte abnormality - evaluation and management  DETERDING,ELIZABETH 12/07/2012, 5:15 AM

## 2012-12-07 NOTE — Progress Notes (Signed)
Family Medicine Teaching Service Primary Care Provider Social Note Service Pager: [phone]  Patient name: Jeffrey [name] Medical record number: [id number] Date of birth: 13-Dec-1965 Age: 47 y.o. Gender: male Length of Stay:  LOS: 1 day   Pt is known to FMTS through recent hospitalization and from PCP office visits but has significant history for medical non-compliance and poor follow-up.  Briefly I understand that [name] underwent PEA arrest that is felt to be associated with significant OSA in setting of EtOH abuse (level of 278 on admission).  Currently being re-warmed from [facility] cooling.  Will need to look for meaningful neurologic recovery over next 72 hours.    FMTS is aware of patient and will follow remotely while pt is managed by CCM.  Appreciate their excellent care and I am hopeful for a meaningful recovery.   Gerda Diss, DO [name] Resident - PGY-2 12/07/2012 4:12 PM

## 2012-12-07 NOTE — H&P (Signed)
PULMONARY  / CRITICAL CARE MEDICINE  Name: Jeffrey [name] MRN: [id number] DOB: November 02, 1965    ADMISSION DATE:  12/06/2012 PCP- Loyola Mast  REFERRING MD :  EDP  CHIEF COMPLAINT:VDRF  BRIEF PATIENT DESCRIPTION:  47 year old male brought in by EMS. EMS called by family because pt did not seem to be breathing Last seen normal when he went to bed last night but EMS not sure specifically what time that was. Shortly before EMS arrived patient's mother checked on him and he did not seem to be breathing. She did hear him snoring approximately 5 minutes before this though. On EMS arrival patient was without pulses and apneic. AED placed and did not recommend shock. King Airway placed and 4 minutes of chest compressions and epi x2 with ROSC. EDP replaced king airway with ETT. PCCM asked to admit to CCU for ? Hypothermia protocol. Note he was  DC'd from [facility] on 2-28 following tx for ETOH and GIB being followed by IMTS. Work up for [employer] was pending but likely has severe OSA by history CBG 80 by EMS, 60 in ED  SIGNIFICANT EVENTS / STUDIES:  3-2 ct head: NAD  LINES / TUBES: ETT 3/2 >> R IJ CVL 3/2 >>   CULTURES: 3-2 bc x 2 >> 3-2 sputum >> 3-2 uc >>  ANTIBIOTICS:    SUBJECTIVE:  Sedated on vent, paralyzed, hypothermic  VITAL SIGNS: Temp:  [89.1 F (31.7 C)-92.1 F (33.4 C)] 91.6 F (33.1 C) (03/03 1400) Pulse Rate:  [51-67] 51 (03/03 1400) Resp:  [0-16] 15 (03/03 1400) BP: (111-188)/(76-99) 166/97 mmHg (03/03 1142) SpO2:  [91 %-100 %] 100 % (03/03 1400) Arterial Line BP: (124-198)/(74-103) 129/81 mmHg (03/03 1400) FiO2 (%):  [40 %] 40 % (03/03 1142) Weight:  [126.7 kg (279 lb 5.2 oz)] 126.7 kg (279 lb 5.2 oz) (03/03 0500) HEMODYNAMICS: CVP:  [9 mmHg-14 mmHg] 13 mmHg VENTILATOR SETTINGS: Vent Mode:  [-] PRVC FiO2 (%):  [40 %] 40 % Set Rate:  [15 bmp] 15 bmp Vt Set:  [600 mL] 600 mL PEEP:  [5 cmH20] 5 cmH20 Plateau Pressure:  [28 cmH20-32 cmH20] 29 cmH20 INTAKE /  OUTPUT: Intake/Output     03/02 0701 - 03/03 0700 03/03 0701 - 03/04 0700   I.V. (mL/kg) 3781.7 (29.8) 956.4 (7.5)   IV Piggyback 500 100   Total Intake(mL/kg) 4281.7 (33.8) 1056.4 (8.3)   Urine (mL/kg/hr) 1232 545 (0.5)   Emesis/NG output 200 190 (0.2)   Stool 500    Total Output 1932 735   Net +2349.7 +321.4          PHYSICAL EXAMINATION: General:  MO AAM  Neuro: Sedated and NMB HEENT: Disconjugate gaze Cardiovascular:  HSR RRR Lungs: Coarse rhonchi Abdomen: obese  Musculoskeletal: intact Skin:  cool  LABS:  Recent Labs Lab 12/06/12 1039 12/06/12 1047 12/06/12 1048 12/06/12 1227 12/06/12 1228 12/06/12 1300  12/06/12 1819 12/06/12 1900  12/07/12 0050  12/07/12 0400 12/07/12 0440 12/07/12 0451 12/07/12 0916 12/07/12 1321  HGB  --  8.6*  --   --   --   --   < >  --   --   < >  --   < > 8.9*  --  9.5* 9.9* 9.2*  WBC  --  6.3  --   --   --   --   --   --   --   --   --   --  9.8  --   --   --   --  PLT  --  417*  --   --   --   --   --   --   --   --   --   --  371  --   --   --   --   NA  --  141  --   --   --   --   < >  --   --   < >  --   < > 141  --  145 143 142  K  --  3.7  --   --   --   --   < >  --   --   < >  --   < > 3.1*  --  3.1* 3.5 3.8  CL  --  104  --   --   --   --   < >  --   --   < >  --   < > 110  --  108 108 108  CO2  --  21  --   --   --   --   --   --   --   --   --   --  24  --   --   --   --   GLUCOSE  --  61*  --   --   --   --   < >  --   --   < >  --   < > 74  --  68* 95 140*  BUN  --  6  --   --   --   --   < >  --   --   < >  --   < > 9  --  7 8 9   CREATININE  --  1.21  --   --   --   --   < >  --  1.07  < >  --   < > 1.00  --  1.00 1.00 0.90  CALCIUM  --  8.1*  --   --   --   --   --   --   --   --   --   --  7.2*  --   --   --   --   MG  --  1.8  --   --   --   --   --   --   --   --   --   --   --   --   --   --   --   PHOS  --  6.1*  --   --   --   --   --   --   --   --   --   --   --   --   --   --   --   AST  --  89*  --   --    --   --   --   --   --   --   --   --   --   --   --   --   --   ALT  --  50  --   --   --   --   --   --   --   --   --   --   --   --   --   --   --  ALKPHOS  --  103  --   --   --   --   --   --   --   --   --   --   --   --   --   --   --   BILITOT  --  0.2*  --   --   --   --   --   --   --   --   --   --   --   --   --   --   --   PROT  --  6.6  --   --   --   --   --   --   --   --   --   --   --   --   --   --   --   ALBUMIN  --  2.7*  --   --   --   --   --   --   --   --   --   --   --   --   --   --   --   APTT  --   --   --  30  --   --   --   --  31  --   --   --   --   --   --   --   --   INR  --   --   --  1.00  --   --   --   --   --   --   --   --   --   --   --   --   --   LATICACIDVEN 5.8*  --   --   --   --   --   --   --   --   --   --   --   --   --   --   --   --   TROPONINI  --   --  <0.30  --   --   --   --   --  <0.30  --  <0.30  --   --   --   --   --   --   PROCALCITON  --   --   --   --  <0.10  --   --   --   --   --   --   --   --   --   --   --   --   PHART  --   --   --   --   --  7.361  --  7.467*  --   --   --   --   --  7.429  --   --   --   PCO2ART  --   --   --   --   --  39.7  --  28.7*  --   --   --   --   --  33.5*  --   --   --   PO2ART  --   --   --   --   --  183.0*  --  90.0  --   --   --   --   --  56.4*  --   --   --   < > = values in this  interval not displayed.  Recent Labs Lab 12/07/12 0840 12/07/12 0915 12/07/12 1007 12/07/12 1103 12/07/12 1202  GLUCAP 90 88 76 73 68*    Imaging: Ct Head Wo Contrast  12/06/2012  *RADIOLOGY REPORT*  Clinical Data: Unwitnessed arrest  CT HEAD WITHOUT CONTRAST  Technique:  Contiguous axial images were obtained from the base of the skull through the vertex without contrast.  Comparison: 11/19/2010  Findings: No evidence of parenchymal hemorrhage or extra-axial fluid collection. No mass lesion, mass effect, or midline shift.  No CT evidence of acute infarction.  Cerebral volume is age appropriate.  No  ventriculomegaly.  Mucosal thickening/partial opacification of the bilateral ethmoid and maxillary sinuses.  The mastoid air cells are clear.  No evidence of calvarial fracture.  IMPRESSION: No evidence of acute intracranial abnormality.   Original Report Authenticated By: Julian Hy, M.D.    Portable Chest Xray In Am  12/07/2012  *RADIOLOGY REPORT*  Clinical Data: Respiratory and cardiac arrest.  PORTABLE CHEST - 1 VIEW  Comparison: 12/06/2012  Findings: The endotracheal tube tip is now approximately 1.4 cm above the carina.  There is some progressive consolidation of the left mid and lower lung and the right lower lung suggestive of bilateral pneumonia.  There may also be a component of pulmonary edema present.  No significant pleural fluid is identified.  The heart size is stable.  Right jugular central line positioning stable.  IMPRESSION: Progressive consolidation in both lungs suggestive of bilateral pneumonia.   Original Report Authenticated By: Aletta Edouard, M.D.    Dg Chest Portable 1 View  12/06/2012  *RADIOLOGY REPORT*  Clinical Data: Cardiac arrest  PORTABLE CHEST - 1 VIEW  Comparison: 12/06/2012 at 1059 hours  Findings: Cardiomegaly with mild interstitial edema.  Endotracheal tube terminates 3.5 cm above the carina.  Enteric tube is looped in the stomach.  Interval placement of a right IJ venous catheter which terminates in the mid SVC.  No pneumothorax.  IMPRESSION: Interval placement of a right IJ venous catheter which terminates in the mid SVC.  No pneumothorax.  Otherwise unchanged.   Original Report Authenticated By: Julian Hy, M.D.    Dg Chest Portable 1 View  12/06/2012  *RADIOLOGY REPORT*  Clinical Data: Respiratory arrest, intubated  PORTABLE CHEST - 1 VIEW  Comparison: 11/19/2010  Findings: Endotracheal tube terminates 3 cm above the carina.  Cardiomegaly with mild interstitial edema.  No pleural effusion or pneumothorax.  Enteric tube is looped in the stomach.   IMPRESSION: Endotracheal tube terminates 3 cm above the carina.  Cardiomegaly with mild interstitial edema.   Original Report Authenticated By: Julian Hy, M.D.      ASSESSMENT / PLAN:  PULMONARY A:VDRF secondary to resp/cardiac arrest. P:   Vent settings reviewed  CARDIOVASCULAR A: S/P asystolic arrest Shock, resolved P:  COnt current monitoring and HD goals per hypthermia protocol  RENAL  A: No acute issue P:   Monitor renal panel and correct electrolytes accordingly  GASTROINTESTINAL A:  Hx of GIB diarrhea P:   Change PPI to H2RB  HEMATOLOGIC A:  No acute issue but hx GIB with blood loss anemia P:  Monitor cbc  INFECTIOUS A:  No acute issue P:   Pan culture for completness Cont to hold abx for now  ENDOCRINE A: DM II - long acting insulin PTA Refractory hypoglycemia P:   Cont D10 IVFs D50 as needed  NEUROLOGIC A: Post anoxic encephalopathy P:   Reassess post rewarming   35 mins CCM  Merton Border,  MD ; [facility] service Mobile [phone].  After 5:30 PM or weekends, call [phone]

## 2012-12-08 ENCOUNTER — Encounter (HOSPITAL_COMMUNITY): Payer: Self-pay | Admitting: Pulmonary Disease

## 2012-12-08 ENCOUNTER — Inpatient Hospital Stay (HOSPITAL_COMMUNITY): Payer: Managed Care, Other (non HMO)

## 2012-12-08 DIAGNOSIS — N289 Disorder of kidney and ureter, unspecified: Secondary | ICD-10-CM | POA: Diagnosis present

## 2012-12-08 DIAGNOSIS — G9340 Encephalopathy, unspecified: Secondary | ICD-10-CM

## 2012-12-08 DIAGNOSIS — J811 Chronic pulmonary edema: Secondary | ICD-10-CM | POA: Diagnosis present

## 2012-12-08 HISTORY — DX: Disorder of kidney and ureter, unspecified: N28.9

## 2012-12-08 LAB — URINALYSIS, ROUTINE W REFLEX MICROSCOPIC
Glucose, UA: NEGATIVE mg/dL
Protein, ur: NEGATIVE mg/dL
pH: 5 (ref 5.0–8.0)

## 2012-12-08 LAB — BASIC METABOLIC PANEL
CO2: 23 mEq/L (ref 19–32)
CO2: 24 mEq/L (ref 19–32)
Chloride: 108 mEq/L (ref 96–112)
Chloride: 106 mEq/L (ref 96–112)
GFR calc Af Amer: >90 mL/min (ref >90)
Glucose, Bld: 176 mg/dL — ABNORMAL HIGH (ref 70–99)
Potassium: 3.9 mEq/L (ref 3.5–5.1)
Potassium: 5 mEq/L (ref 3.5–5.1)
Sodium: 137 mEq/L (ref 135–145)
Sodium: 136 mEq/L (ref 135–145)

## 2012-12-08 LAB — CBC
Hemoglobin: 9.2 g/dL — ABNORMAL LOW (ref 13.0–17.0)
MCH: 28 pg (ref 26.0–34.0)
MCV: 87.5 fL (ref 78.0–100.0)
RBC: 3.28 MIL/uL — ABNORMAL LOW (ref 4.22–5.81)

## 2012-12-08 LAB — GLUCOSE, CAPILLARY
Glucose-Capillary: 111 mg/dL — ABNORMAL HIGH (ref 70–99)
Glucose-Capillary: 122 mg/dL — ABNORMAL HIGH (ref 70–99)
Glucose-Capillary: 122 mg/dL — ABNORMAL HIGH (ref 70–99)
Glucose-Capillary: 63 mg/dL — ABNORMAL LOW (ref 70–99)
Glucose-Capillary: 80 mg/dL (ref 70–99)
Glucose-Capillary: 91 mg/dL (ref 70–99)
Glucose-Capillary: 92 mg/dL (ref 70–99)
Glucose-Capillary: 93 mg/dL (ref 70–99)

## 2012-12-08 LAB — PRO B NATRIURETIC PEPTIDE: Pro B Natriuretic peptide (BNP): 127.5 pg/mL — ABNORMAL HIGH (ref 0–125)

## 2012-12-08 LAB — APTT: aPTT: 36 seconds (ref 24–37)

## 2012-12-08 LAB — URINE MICROSCOPIC-ADD ON

## 2012-12-08 MED ORDER — AMLODIPINE BESYLATE 5 MG PO TABS
5.0000 mg | ORAL_TABLET | Freq: Every day | ORAL | Status: DC
  Administered 2012-12-09 – 2012-12-10 (×2): 5 mg via ORAL
  Filled 2012-12-08 (×3): qty 1

## 2012-12-08 MED ORDER — FUROSEMIDE 10 MG/ML IJ SOLN
40.0000 mg | Freq: Once | INTRAMUSCULAR | Status: AC
  Administered 2012-12-08: 40 mg via INTRAVENOUS
  Filled 2012-12-08: qty 4

## 2012-12-08 MED ORDER — HYDRALAZINE HCL 20 MG/ML IJ SOLN
10.0000 mg | INTRAMUSCULAR | Status: DC | PRN
  Filled 2012-12-08 (×2): qty 2

## 2012-12-08 MED ORDER — ACETAMINOPHEN 10 MG/ML IV SOLN
1000.0000 mg | Freq: Once | INTRAVENOUS | Status: AC
  Administered 2012-12-08: 1000 mg via INTRAVENOUS
  Filled 2012-12-08: qty 100

## 2012-12-08 MED ORDER — METOPROLOL TARTRATE 1 MG/ML IV SOLN: 2.5000 mg | INTRAVENOUS | Status: DC | PRN

## 2012-12-08 MED ORDER — HYDRALAZINE HCL 20 MG/ML IJ SOLN: 5.0000 mg | INTRAMUSCULAR | Status: DC | PRN

## 2012-12-08 MED ORDER — ASPIRIN 81 MG PO CHEW
81.0000 mg | CHEWABLE_TABLET | Freq: Every day | ORAL | Status: DC
  Administered 2012-12-09 – 2012-12-14 (×6): 81 mg via ORAL
  Filled 2012-12-08 (×6): qty 1

## 2012-12-08 MED ORDER — BIOTENE DRY MOUTH MT LIQD
15.0000 mL | Freq: Two times a day (BID) | OROMUCOSAL | Status: DC
  Administered 2012-12-08 – 2012-12-14 (×11): 15 mL via OROMUCOSAL

## 2012-12-08 MED ORDER — HYDROCOD POLST-CHLORPHEN POLST 10-8 MG/5ML PO LQCR
5.0000 mL | Freq: Two times a day (BID) | ORAL | Status: DC | PRN
  Administered 2012-12-08 – 2012-12-09 (×3): 5 mL via ORAL
  Filled 2012-12-08 (×3): qty 5

## 2012-12-08 MED ORDER — DEXTROSE-NACL 5-0.45 % IV SOLN
INTRAVENOUS | Status: DC
  Administered 2012-12-08 (×2): via INTRAVENOUS

## 2012-12-08 NOTE — Progress Notes (Signed)
FMTS social note  S: patient intubated and sedated. Will open eyes to command and will squeeze fingers, though will not nod head to answer questions. When asked if he remembered me from the last admission the patient pointed to me.  O:  PE  Gen: NAD, intubated, resting in bed CV: rrr, no mrg appreciated Pulm: CTAB anteriorly Ext: no edema  A/P: Patient is a 47 yo male with OSA and alcohol use history who presented after PEA arrest yesterday felt to be due to OSA respiratory arrest in conjunction with alcohol use. -Agree with CCM management at this time -continue intubation, sedation, and pressors -f/u on cultures -will be glad to assume care once stable for transfer to the floor  Tommi Rumps, MD PGY1 FMTS

## 2012-12-08 NOTE — Progress Notes (Signed)
Dr. Alva Garnet notified of continued increase in temperature now 101.8.  Orders received.  Will continue to monitor pt closely.

## 2012-12-08 NOTE — Progress Notes (Signed)
PULMONARY  / CRITICAL CARE MEDICINE  Name: Jeffrey [name] MRN: [id number] DOB: September 08, 1966    ADMISSION DATE:  12/06/2012 PCP- Loyola Mast  REFERRING MD :  EDP  CHIEF COMPLAINT:VDRF  BRIEF PATIENT DESCRIPTION:  47 year old male brought in by EMS. EMS called by family because pt did not seem to be breathing Last seen normal when he went to bed last night but EMS not sure specifically what time that was. Shortly before EMS arrived patient's mother checked on him and he did not seem to be breathing. She did hear him snoring approximately 5 minutes before this though. On EMS arrival patient was without pulses and apneic. AED placed and did not recommend shock. King Airway placed and 4 minutes of chest compressions and epi x2 with ROSC. EDP replaced king airway with ETT. PCCM asked to admit to CCU for ? Hypothermia protocol. Note he was  DC'd from [facility] on 2-28 following tx for ETOH and GIB being followed by IMTS. Work up for [employer] was pending but likely has severe OSA by history CBG 80 by EMS, 60 in ED  SIGNIFICANT EVENTS / STUDIES:  3-2 CT head: NAD  LINES / TUBES: ETT 3/2 >> R IJ CVL 3/2 >>  CULTURES: blood 3/2 >> sputum 3/2 >> urine 3/2 >> blood 3/4 >> sputum 3/4 >> urine 3/4 >>  ANTIBIOTICS:   SUBJECTIVE:  Passed SBT this AM - extubated and looks good. Febrile. Cultures repeated  VITAL SIGNS: Temp:  [91.8 F (33.2 C)-101.8 F (38.8 C)] 101.1 F (38.4 C) (03/04 1600) Pulse Rate:  [54-136] 110 (03/04 1600) Resp:  [0-20] 18 (03/04 1600) BP: (107-159)/(58-88) 107/58 mmHg (03/04 1600) SpO2:  [92 %-100 %] 97 % (03/04 1600) Arterial Line BP: (84-194)/(45-96) 102/61 mmHg (03/04 1500) FiO2 (%):  [40 %] 40 % (03/04 0937) Weight:  [128.2 kg (282 lb 10.1 oz)] 128.2 kg (282 lb 10.1 oz) (03/04 0500) HEMODYNAMICS: CVP:  [9 mmHg-14 mmHg] 14 mmHg VENTILATOR SETTINGS: Vent Mode:  [-] PSV FiO2 (%):  [40 %] 40 % Set Rate:  [15 bmp] 15 bmp Vt Set:  [600 mL] 600 mL PEEP:  [5 cmH20] 5  cmH20 Pressure Support:  [5 cmH20] 5 cmH20 Plateau Pressure:  [26 cmH20-33 cmH20] 32 cmH20 INTAKE / OUTPUT: Intake/Output     03/03 0701 - 03/04 0700 03/04 0701 - 03/05 0700   I.V. (mL/kg) 4573.1 (35.7) 999.6 (7.8)   IV Piggyback 100 100   Total Intake(mL/kg) 4673.1 (36.5) 1099.6 (8.6)   Urine (mL/kg/hr) 1387 (0.5) 278 (0.2)   Emesis/NG output 50 (0)    Stool     Total Output 1437 278   Net +3236.1 +821.6          PHYSICAL EXAMINATION: General:  Cognition intact, NAD after extubation Neuro: MAEs, + F/C HEENT: malodorous oral and/or nasal secretions Cardiovascular:  RRR s M Lungs: Clear anteriorly without wheeze Abdomen: obese, soft, NABS Ext: warm, no edema   LABS: BMET    Component Value Date/Time   NA 136 12/08/2012 0851   K 5.0 12/08/2012 0851   CL 104 12/08/2012 0851   CO2 24 12/08/2012 0851   GLUCOSE 176* 12/08/2012 0851   BUN 11 12/08/2012 0851   CREATININE 1.56* 12/08/2012 0851   CREATININE 1.59* 09/02/2011 1149   CREATININE 4.36* 10/17/2007 1248   CALCIUM 7.2* 12/08/2012 0851   CALCIUM 8.8 10/20/2007 0535   GFRNONAA 51* 12/08/2012 0851   GFRAA 59* 12/08/2012 0851    CBC    Component Value  Date/Time   WBC 15.7* 12/08/2012 0457   RBC 3.28* 12/08/2012 0457   HGB 9.2* 12/08/2012 0457   HCT 28.7* 12/08/2012 0457   PLT 397 12/08/2012 0457   MCV 87.5 12/08/2012 0457   MCH 28.0 12/08/2012 0457   MCHC 32.1 12/08/2012 0457   RDW 17.2* 12/08/2012 0457   LYMPHSABS 1.7 08/18/2011 1610   MONOABS 1.6* 08/18/2011 1610   EOSABS 0.0 08/18/2011 1610   BASOSABS 0.0 08/18/2011 1610    CXR: CM, edema pattern  Problems:   Asystolic cardiac arrest  Suspect secondary to resp arrest/OSA   Acute respiratory failure   Hypoglycemia   Acute renal insufficiency   OSA (obstructive sleep apnea)   Anoxic-ischemic encephalopathy   Pulmonary edema   Diarrhea   DM2 (diabetes mellitus, type 2)   Obesity    ASSESSMENT / PLAN:  PULMONARY A:VDRF secondary to resp/cardiac arrest. P:   Monitor in ICU post  extubation  CARDIOVASCULAR A: S/P asystolic arrest Shock, resolved Hypertension Sinus tachycardia P:  Cont current monitoring  PRN hydralazine and metoprolol  RENAL  A: hypervolemia P:   Lasix X 1 3/4 Monitor renal panel and correct electrolytes accordingly  GASTROINTESTINAL A:  Hx of GIB Diarrhea SUP not indicated post extubation P:   D/C H2RB  HEMATOLOGIC A:  Hx GIB with blood loss anemia P:  Monitor cbc  INFECTIOUS A:  Fever without obvious source of infection P:   Micro and abx as aobve   ENDOCRINE A: DM II - long acting insulin PTA Refractory hypoglycemia, resolved P:   Change IVFs to D5 1/2 NS Cont SSI  NEUROLOGIC A: Post anoxic encephalopathy, much improved P:   Full neuro assessment when sedatives fully off   35 mins CCM  Merton Border, MD ; [facility] service Mobile [phone].  After 5:30 PM or weekends, call [phone]

## 2012-12-08 NOTE — Progress Notes (Signed)
15cc versed 1mg /mL and 75cc fentanyl 30mcg/mL wasted in sink followed with water flush by 2 RNs

## 2012-12-08 NOTE — Progress Notes (Signed)
Unable to turn off versed and fentanyl at this time due to patient fighting the ventilator and restlessness/shaking. Patient arouses to voice and follows simple commands while on fentanyl and versed. CCM notified

## 2012-12-08 NOTE — Procedures (Signed)
Extubation Procedure Note  Patient Details:   Name: Jeffrey [name] DOB: 1965/12/26 MRN: [id number]   Airway Documentation:   Patient weaned from vent and extubated this a.m. VC 740cc's. Pt had persisted hoarse, dry cough with copious amouts oral secretions.  Evaluation  O2 sats: stable throughout Complications: No apparent complications Patient did tolerate procedure well. Bilateral Breath Sounds: Diminished Suctioning: Airway Yes  Malachi Paradise 12/08/2012, 10:18 AM

## 2012-12-08 NOTE — Progress Notes (Signed)
Dr. Alva Garnet notified of temp 101.5 core.  Will continue to monitor pt closely.

## 2012-12-08 NOTE — Progress Notes (Signed)
Reviewed and discussed in rounds.  Agree with Dr. Ellen Henri documentation.  We will be happy to take on FPTS if/when he is out of the ICU.

## 2012-12-09 ENCOUNTER — Inpatient Hospital Stay (HOSPITAL_COMMUNITY): Payer: Managed Care, Other (non HMO)

## 2012-12-09 DIAGNOSIS — E119 Type 2 diabetes mellitus without complications: Secondary | ICD-10-CM

## 2012-12-09 LAB — BASIC METABOLIC PANEL
BUN: 12 mg/dL (ref 6–23)
CO2: 25 mEq/L (ref 19–32)
Chloride: 105 mEq/L (ref 96–112)
GFR calc non Af Amer: 44 mL/min — ABNORMAL LOW (ref >90)
Glucose, Bld: 123 mg/dL — ABNORMAL HIGH (ref 70–99)
Potassium: 4.2 mEq/L (ref 3.5–5.1)
Sodium: 138 mEq/L (ref 135–145)

## 2012-12-09 LAB — CBC
HCT: 24.7 % — ABNORMAL LOW (ref 39.0–52.0)
Hemoglobin: 7.9 g/dL — ABNORMAL LOW (ref 13.0–17.0)
MCH: 28 pg (ref 26.0–34.0)
MCHC: 32 g/dL (ref 30.0–36.0)
RBC: 2.82 MIL/uL — ABNORMAL LOW (ref 4.22–5.81)

## 2012-12-09 LAB — TROPONIN I: Troponin I: <0.3 ng/mL (ref <0.30)

## 2012-12-09 LAB — GLUCOSE, CAPILLARY: Glucose-Capillary: 109 mg/dL — ABNORMAL HIGH (ref 70–99)

## 2012-12-09 MED ORDER — INSULIN ASPART 100 UNIT/ML ~~LOC~~ SOLN
4.0000 [IU] | Freq: Three times a day (TID) | SUBCUTANEOUS | Status: DC
  Administered 2012-12-10 – 2012-12-14 (×12): 4 [IU] via SUBCUTANEOUS

## 2012-12-09 MED ORDER — PIPERACILLIN-TAZOBACTAM 3.375 G IVPB 30 MIN
3.3750 g | Freq: Once | INTRAVENOUS | Status: AC
  Administered 2012-12-09: 3.375 g via INTRAVENOUS
  Filled 2012-12-09: qty 50

## 2012-12-09 MED ORDER — METOPROLOL TARTRATE 25 MG PO TABS
25.0000 mg | ORAL_TABLET | Freq: Two times a day (BID) | ORAL | Status: DC
  Administered 2012-12-09 – 2012-12-14 (×11): 25 mg via ORAL
  Filled 2012-12-09 (×13): qty 1

## 2012-12-09 MED ORDER — VANCOMYCIN HCL 10 G IV SOLR
2500.0000 mg | Freq: Once | INTRAVENOUS | Status: AC
  Administered 2012-12-09: 2500 mg via INTRAVENOUS
  Filled 2012-12-09: qty 2500

## 2012-12-09 MED ORDER — MORPHINE SULFATE 2 MG/ML IJ SOLN
2.0000 mg | Freq: Once | INTRAMUSCULAR | Status: AC
  Administered 2012-12-09: 2 mg via INTRAVENOUS
  Filled 2012-12-09: qty 1

## 2012-12-09 MED ORDER — MORPHINE SULFATE 2 MG/ML IJ SOLN
1.0000 mg | INTRAMUSCULAR | Status: DC | PRN
  Administered 2012-12-09 (×2): 1 mg via INTRAVENOUS
  Filled 2012-12-09 (×2): qty 1

## 2012-12-09 MED ORDER — ONDANSETRON HCL 4 MG/2ML IJ SOLN
INTRAMUSCULAR | Status: AC
  Administered 2012-12-09: 4 mg via INTRAVENOUS
  Filled 2012-12-09: qty 2

## 2012-12-09 MED ORDER — PIPERACILLIN-TAZOBACTAM 3.375 G IVPB
3.3750 g | Freq: Three times a day (TID) | INTRAVENOUS | Status: DC
  Administered 2012-12-09: 3.375 g via INTRAVENOUS
  Filled 2012-12-09 (×2): qty 50

## 2012-12-09 MED ORDER — INSULIN ASPART 100 UNIT/ML ~~LOC~~ SOLN: 0.0000 [IU] | Freq: Every day | SUBCUTANEOUS | Status: DC

## 2012-12-09 MED ORDER — ACETAMINOPHEN 10 MG/ML IV SOLN
1000.0000 mg | Freq: Once | INTRAVENOUS | Status: AC
  Administered 2012-12-09: 1000 mg via INTRAVENOUS
  Filled 2012-12-09: qty 100

## 2012-12-09 MED ORDER — VANCOMYCIN HCL 10 G IV SOLR
1250.0000 mg | Freq: Two times a day (BID) | INTRAVENOUS | Status: DC
  Filled 2012-12-09: qty 1250

## 2012-12-09 MED ORDER — MORPHINE SULFATE 2 MG/ML IJ SOLN
2.0000 mg | INTRAMUSCULAR | Status: DC | PRN
  Administered 2012-12-09 – 2012-12-10 (×3): 2 mg via INTRAVENOUS
  Filled 2012-12-09 (×3): qty 1

## 2012-12-09 MED ORDER — INSULIN ASPART 100 UNIT/ML ~~LOC~~ SOLN
0.0000 [IU] | Freq: Three times a day (TID) | SUBCUTANEOUS | Status: DC
  Administered 2012-12-10 – 2012-12-11 (×3): 2 [IU] via SUBCUTANEOUS
  Administered 2012-12-11 – 2012-12-12 (×4): 3 [IU] via SUBCUTANEOUS
  Administered 2012-12-13: 8 [IU] via SUBCUTANEOUS
  Administered 2012-12-13: 5 [IU] via SUBCUTANEOUS
  Administered 2012-12-14: 8 [IU] via SUBCUTANEOUS
  Administered 2012-12-14: 11 [IU] via SUBCUTANEOUS

## 2012-12-09 MED ORDER — ONDANSETRON HCL 4 MG/2ML IJ SOLN: 4.0000 mg | Freq: Three times a day (TID) | INTRAMUSCULAR | Status: DC | PRN

## 2012-12-09 NOTE — Progress Notes (Signed)
ANTIBIOTIC CONSULT NOTE - INITIAL  Pharmacy Consult for Vancocin and Zosyn Indication: rule out pneumonia  Allergies  Allergen Reactions  . Lisinopril     Pt reports nose bleed.    Patient Measurements: Height: 6' (182.9 cm) Weight: 282 lb 10.1 oz (128.2 kg) IBW/kg (Calculated) : 77.6  Vital Signs: Temp: 101.3 F (38.5 C) (03/05 0200) BP: 141/71 mmHg (03/05 0200) Pulse Rate: 121 (03/05 0200)  Labs:  Recent Labs  12/06/12 1047  12/07/12 0400  12/07/12 1321 12/07/12 1708  12/08/12 0050 12/08/12 0457 12/08/12 0851  WBC 6.3  --  9.8  --   --   --   --   --  15.7*  --   HGB 8.6*  < > 8.9*  < > 9.2* 9.9*  --   --  9.2*  --   PLT 417*  --  371  --   --   --   --   --  397  --   CREATININE 1.21  < > 1.00  < > 0.90 1.00  < > 1.07 1.34 1.56*  < > = values in this interval not displayed. Estimated Creatinine Clearance: 80.1 ml/min (by C-G formula based on Cr of 1.56).  Microbiology: Recent Results (from the past 720 hour(s))  CULTURE, BLOOD (ROUTINE X 2)     Status: None   Collection Time    12/06/12 10:50 AM      Result Value Range Status   Specimen Description BLOOD RIGHT HAND   Final   Special Requests BOTTLES DRAWN AEROBIC AND ANAEROBIC 10CC   Final   Culture  Setup Time 12/06/2012 17:28   Final   Culture     Final   Value:        BLOOD CULTURE RECEIVED NO GROWTH TO DATE CULTURE WILL BE HELD FOR 5 DAYS BEFORE ISSUING A FINAL NEGATIVE REPORT   Report Status PENDING   Incomplete  URINE CULTURE     Status: None   Collection Time    12/06/12 10:54 AM      Result Value Range Status   Specimen Description URINE, CATHETERIZED   Final   Special Requests NONE   Final   Culture  Setup Time 12/06/2012 18:04   Final   Colony Count NO GROWTH   Final   Culture NO GROWTH   Final   Report Status 12/07/2012 FINAL   Final  CULTURE, BLOOD (ROUTINE X 2)     Status: None   Collection Time    12/06/12 11:05 AM      Result Value Range Status   Specimen Description BLOOD LEFT HAND    Final   Special Requests BOTTLES DRAWN AEROBIC AND ANAEROBIC 10CC   Final   Culture  Setup Time 12/06/2012 17:28   Final   Culture     Final   Value:        BLOOD CULTURE RECEIVED NO GROWTH TO DATE CULTURE WILL BE HELD FOR 5 DAYS BEFORE ISSUING A FINAL NEGATIVE REPORT   Report Status PENDING   Incomplete  MRSA PCR SCREENING     Status: None   Collection Time    12/06/12  3:50 PM      Result Value Range Status   MRSA by PCR NEGATIVE  NEGATIVE Final   Comment:            The GeneXpert MRSA Assay (FDA     approved for NASAL specimens     only), is one component of a  comprehensive MRSA colonization     surveillance program. It is not     intended to diagnose MRSA     infection nor to guide or     monitor treatment for     MRSA infections.    Medical History: Past Medical History  Diagnosis Date  . Hypertension   . Blood transfusion   . Diabetes mellitus   . Venous insufficiency 12/13/2011  . Cardiac arrest 12/07/2012  . OSA (obstructive sleep apnea) 12/07/2012  . Anxiety   . Acute renal insufficiency 12/08/2012    Medications:  Prescriptions prior to admission  Medication Sig Dispense Refill  . amLODipine (NORVASC) 5 MG tablet Take 1 tablet (5 mg total) by mouth daily.  90 tablet  3  . aspirin (ADULT ASPIRIN EC LOW STRENGTH) 81 MG EC tablet Take 1 tablet (81 mg total) by mouth daily.  30 tablet  0  . furosemide (LASIX) 40 MG tablet Take 0.5 tablets (20 mg total) by mouth daily.  90 tablet  3  . insulin aspart (NOVOLOG FLEXPEN) 100 UNIT/ML injection Inject 5-10 units before meals as directed by your sliding scale.  1 vial  12  . insulin glargine (LANTUS) 100 UNIT/ML injection Inject 70 Units into the skin daily.      [name] losartan (COZAAR) 50 MG tablet Take 1 tablet (50 mg total) by mouth daily.  90 tablet  3  . metoprolol succinate (TOPROL-XL) 100 MG 24 hr tablet Take 1 tablet (100 mg total) by mouth daily.  30 tablet  0  . omeprazole (PRILOSEC OTC) 20 MG tablet Take 20 mg by  mouth daily.      . pravastatin (PRAVACHOL) 80 MG tablet Take 1 tablet (80 mg total) by mouth at bedtime. for Cholesterol  90 tablet  3  . sildenafil (VIAGRA) 100 MG tablet Take 1 tablet (100 mg total) by mouth as needed for erectile dysfunction. Take 1 tablet 30 minutes to 4 hours before intercourse  30 tablet  3  . traZODone (DESYREL) 50 MG tablet Take 50 mg by mouth at bedtime.       Scheduled:  . [COMPLETED] acetaminophen  1,000 mg Intravenous Once  . acetaminophen  1,000 mg Intravenous Once  . amLODipine  5 mg Oral Daily  . antiseptic oral rinse  15 mL Mouth Rinse BID  . aspirin  81 mg Oral Daily  . [COMPLETED] furosemide  40 mg Intravenous Once  . heparin subcutaneous  5,000 Units Subcutaneous Q8H  .  morphine injection  2 mg Intravenous Once  . piperacillin-tazobactam  3.375 g Intravenous Once  . piperacillin-tazobactam (ZOSYN)  IV  3.375 g Intravenous Q8H  . vancomycin  1,250 mg Intravenous Q12H  . vancomycin  2,500 mg Intravenous Once  . [DISCONTINUED] sodium chloride  2,000 mL Intravenous Once  . [DISCONTINUED] antiseptic oral rinse  15 mL Mouth Rinse Q2H  . [DISCONTINUED] artificial tears  1 application Both Eyes [id number]  . [DISCONTINUED] chlorhexidine  15 mL Mouth/Throat BID  . [DISCONTINUED] famotidine  20 mg Oral BID  . [DISCONTINUED] folic acid  1 mg Intravenous Daily  . [DISCONTINUED] thiamine  100 mg Intravenous Daily    Assessment: 47yo male admitted three days ago s/p arrest, now c/o cough, with leukocytosis and fever, CXR shows possible PNA, also at risk for aspiration, to begin IV ABX.  Goal of Therapy:  Vancomycin trough level 15-20 mcg/ml  Plan:  Will give vancomycin 2500mg  IV x1 then begin 1250mg  IV Q12H as well as  Zosyn 3.375g IV Q8H and monitor CBC, Cx, levels prn.  Wynona Neat, PharmD, BCPS  12/09/2012,2:26 AM

## 2012-12-09 NOTE — Progress Notes (Signed)
I was called by nursing staff with fever; the patient is s/p cardiorespiratory arrest, has leukocytosis, B infiltrates   Initiate vanc and zosyn for possible aspiration;   De escalate antibiotics according to culture and sensitivities.

## 2012-12-09 NOTE — Progress Notes (Signed)
FMTS social note  S: Patient extubated yesterday. Off sedatives and pressors. Having some chest pain likely related to compressions and cough. Able to follow commands. Sore throat prohibiting from coughing.  O:  PE  Gen: NAD, resting in bed CV: rrr, no mrg appreciated Pulm: CTAB anteriorly, coughing during exam Ext: no edema  A/P: Patient is a 47 yo male with OSA and alcohol use history who presented after PEA arrest yesterday felt to be due to OSA respiratory arrest in conjunction with alcohol use. -Agree with CCM management at this time -continue current treatment -f/u on cultures -patient to come out of ICU today, will assume care once he is to lesser level of care  Tommi Rumps, MD PGY1 FMTS

## 2012-12-09 NOTE — Progress Notes (Signed)
NUTRITION FOLLOW UP  Intervention:   1.RD will continue to follow po intake vs need for nutrition support  Nutrition Dx:   Inadequate oral intake related to inability to eat as evidenced by NPO diet. Ongoing   Goal:   EN goal no longer applicable.  New Goal: Pt to meet >/=90% estimated nutrition needs.   Monitor:   PO intake vs need for nutrition support, weight trends, labs, I/O's   Assessment:   Pt was extubated post rewarming. Waiting on swallow evaluation.    Height: Ht Readings from Last 1 Encounters:  12/06/12 6' (1.829 m)    Weight Status:   Wt Readings from Last 1 Encounters:  12/08/12 282 lb 10.1 oz (128.2 kg)    Re-estimated needs:  Kcal: 2200-2400 Protein: 90-100 gm  Fluid: >/= 2 L   Skin: Diabetic ulcers to BLE and R upper arm   Diet Order: Carb Control   Intake/Output Summary (Last 24 hours) at 12/09/12 1132 Last data filed at 12/09/12 1000  Gross per 24 hour  Intake   3725 ml  Output    748 ml  Net   2977 ml    Last BM: 3/3    Labs:   Recent Labs Lab 12/06/12 1047  12/08/12 0457 12/08/12 0851 12/09/12 0350  NA 141  < > 137 136 138  K 3.7  < > 4.3 5.0 4.2  CL 104  < > 106 104 105  CO2 21  < > 23 24 25   BUN 6  < > 10 11 12   CREATININE 1.21  < > 1.34 1.56* 1.75*  CALCIUM 8.1*  < > 7.0* 7.2* 7.4*  MG 1.8  --   --   --   --   PHOS 6.1*  --   --   --   --   GLUCOSE 61*  < > 128* 176* 123*  < > = values in this interval not displayed.  CBG (last 3)   Recent Labs  12/08/12 0611 12/08/12 0651 12/08/12 0741  GLUCAP 93 114* 122*    Scheduled Meds: . amLODipine  5 mg Oral Daily  . antiseptic oral rinse  15 mL Mouth Rinse BID  . aspirin  81 mg Oral Daily  . heparin subcutaneous  5,000 Units Subcutaneous Q8H  . metoprolol tartrate  25 mg Oral BID    Continuous Infusions: . sodium chloride Stopped (12/08/12 1100)  . sodium chloride Stopped (12/08/12 1100)  . dextrose 5 % and 0.45% NaCl 75 mL/hr at 12/09/12 0800    Orson Slick RD, LDN Pager [phone] After Hours pager [phone]

## 2012-12-09 NOTE — Progress Notes (Signed)
Seen and examined.  He is coming around nicely.  We will assume primary care as he exists the ICU - likely tomorrow.  Great care by CCM as usual.

## 2012-12-09 NOTE — Progress Notes (Signed)
Pt observed with dry cough/sneezing almost continuously, Elink MD notified. New order received.  Pt observed with HR 120- 130's again with cough spell, c/o chest hurting and very restless with Bipap on.,Elink MD notified. New order received. Morphine 1 mg given, will reassess.   Pt observed with another round of coughing spell, clutching chest and stating it was hurting. Pt reassured, EKG done. RT notified to take off Bipap, Pt placed back on [place]. Pt also running a fever with T (101.3) Elink MD notified. Awaiting further orders. Will cont to monitor closely.

## 2012-12-09 NOTE — Progress Notes (Signed)
The patient with severe cough, dry with low chest wall pain with cough; currently on BiPAP; NPO;   Morphine ordered prn.

## 2012-12-09 NOTE — Progress Notes (Signed)
Called to pt.'s room by RN to assist with BIPAP. Pt. Is screaming out in pain stating that he is having chest pain. BIPAP was causing pt. More anxiety. BIPAP was taken off of pt. & pt. Stated that he felt better with it off but was still having chest pain. EKG obtained by RN.

## 2012-12-09 NOTE — Progress Notes (Signed)
PULMONARY  / CRITICAL CARE MEDICINE  Name: Jeffrey [name] MRN: [id number] DOB: [birth year]    ADMISSION DATE:  12/06/2012 PCP- Loyola Mast  REFERRING MD :  EDP  CHIEF COMPLAINT:VDRF  BRIEF PATIENT DESCRIPTION:  63 M admitted via EMS > ED. Family had found him "not breathing right". Shortly before EMS arrived patient's mother checked on him and he did not seem to be breathing. She did hear him snoring approximately 5 minutes before this though. On EMS arrival patient was without pulses and apneic. AED placed and did not recommend shock. King Airway placed and 4 minutes of chest compressions and epi x2 with ROSC. EDP replaced king airway with ETT. PCCM asked to admit to CCU for ? Hypothermia protocol. Note he was  DC'd from [facility] on 2-28 following tx for ETOH and GIB being followed by IMTS. Work up for [employer] was pending but likely has severe OSA by history CBG 80 by EMS, 60 in ED  SIGNIFICANT EVENTS / STUDIES:  3-2 CT head: NAD  LINES / TUBES: ETT 3/2 >> 3/4 Arterial cath 3/2 >> 3/4 R IJ CVL 3/2 >> 3/5  CULTURES: blood 3/2 >>  sputum 3/2 >>  urine 3/2 >>  blood 3/4 >>  sputum 3/4 >>  urine 3/4 >>   ANTIBIOTICS: Zosyn 3/5 - 3/5  Vanc 3/5 - 3/5   SUBJECTIVE:  Patient was febrile and complaining of chest pain overnight. Vanc and Zosyn started. He had anxiety r/t BIPAP, was placed on [place] and is maintaining SpO2 > 95%. Significant chest pain with coughing and deep breathing.   VITAL SIGNS: Temp:  [100.2 F (37.9 C)-101.8 F (38.8 C)] 100.2 F (37.9 C) (03/05 0800) Pulse Rate:  [102-136] 114 (03/05 0800) Resp:  [7-23] 14 (03/05 0800) BP: (107-158)/(51-95) 147/95 mmHg (03/05 0800) SpO2:  [86 %-100 %] 98 % (03/05 0800) Arterial Line BP: (96-123)/(45-80) 102/61 mmHg (03/04 1500) FiO2 (%):  [40 %] 40 % (03/05 0000) HEMODYNAMICS:   VENTILATOR SETTINGS: Vent Mode:  [-]  FiO2 (%):  [40 %] 40 % INTAKE / OUTPUT: Intake/Output     03/04 0701 - 03/05 0700 03/05 0701 - 03/06 0700    I.V. (mL/kg) 2124.6 (16.6) 75 (0.6)   Other 650 350   IV Piggyback 750    Total Intake(mL/kg) 3524.6 (27.5) 425 (3.3)   Urine (mL/kg/hr) 778 (0.3)    Emesis/NG output     Total Output 778     Net +2746.6 +425          PHYSICAL EXAMINATION: General:  Cognition intact, NAD after extubation Neuro: AAOx4 Follows commands and responds to questions appropriately.  HEENT: malodorous oral and/or nasal secretions. Poor dentition. May possibly lose upper left central incisor soon.  Cardiovascular:  RRR s M, anterior chest very tender.  Lungs: Clear anteriorly without wheeze Abdomen: obese, soft, non-tender Ext: warm, no edema   LABS: BMET    Component Value Date/Time   NA 138 12/09/2012 0350   K 4.2 12/09/2012 0350   CL 105 12/09/2012 0350   CO2 25 12/09/2012 0350   GLUCOSE 123* 12/09/2012 0350   BUN 12 12/09/2012 0350   CREATININE 1.75* 12/09/2012 0350   CREATININE 1.59* 09/02/2011 1149   CREATININE 4.36* 10/17/2007 1248   CALCIUM 7.4* 12/09/2012 0350   CALCIUM 8.8 10/20/2007 0535   GFRNONAA 44* 12/09/2012 0350   GFRAA 51* 12/09/2012 0350    CBC    Component Value Date/Time   WBC 14.1* 12/09/2012 0350   RBC 2.82*  12/09/2012 0350   HGB 7.9* 12/09/2012 0350   HCT 24.7* 12/09/2012 0350   PLT 316 12/09/2012 0350   MCV 87.6 12/09/2012 0350   MCH 28.0 12/09/2012 0350   MCHC 32.0 12/09/2012 0350   RDW 17.4* 12/09/2012 0350   LYMPHSABS 1.7 08/18/2011 1610   MONOABS 1.6* 08/18/2011 1610   EOSABS 0.0 08/18/2011 1610   BASOSABS 0.0 08/18/2011 1610    CXR: Improving bilateral aeration, left basilar opacity.   Problems:   Asystolic cardiac arrest  Suspect secondary to resp arrest/OSA   Acute respiratory failure   Hypoglycemia   Acute renal insufficiency   OSA (obstructive sleep apnea)   Anoxic-ischemic encephalopathy   Pulmonary edema   Diarrhea   DM2 (diabetes mellitus, type 2)   Obesity   Fever - no overt evidence of bacterial infection   ASSESSMENT / PLAN:  PULMONARY A:VDRF secondary to  resp/cardiac arrest. 2/5 No respiratory distress on 3L [place] P:   Supplemental O2.  Cough and Pain management for BIPAP compliance.  Follow CXR   CARDIOVASCULAR A: S/P asystolic arrest Shock, resolved Hypertension Sinus tachycardia P:  Cont current monitoring  PRN hydralazine and metoprolol Start PO metoprolol.  RENAL  A: hypervolemia P:   Monitor renal panel and correct electrolytes accordingly  GASTROINTESTINAL A:  Hx of GIB Diarrhea SUP not indicated post extubation P:   D/C H2RB  HEMATOLOGIC A:  Hx GIB with blood loss anemia P:  Monitor cbc Stool guaiac  INFECTIOUS A:  Fever without obvious source of infection P:   D/c Vanc and Zosyn F/u cultures.    ENDOCRINE A: DM II - long acting insulin PTA Refractory hypoglycemia, resolved P:   KVO IVFs Begin CH mod diet - RN requests SLP eval first Cont SSI and ACHS CBG monitoring   NEUROLOGIC A: Post anoxic encephalopathy, much improved 3/5-Follows commands and responds to questions appropriately.   P:   Supportive Care  GENERAL A. Poor dentition P. Question need for dental consult.   Summary: Patient doing well on nasal cannula. Transfer to SDU.  In absence of definitive site of infection and of physiologic deterioration to suggest sepsis, would favor holding off on abx for now.  Spoke to Dr. Paulla Fore and family medicine will assume care tomorrow morning.   Merton Border, MD ; [facility] [phone].  After 5:30 PM or weekends, call [phone]

## 2012-12-09 NOTE — Evaluation (Signed)
Clinical/Bedside Swallow Evaluation Patient Details  Name: Jeffrey [name] MRN: [id number] Date of Birth: June 09, 1966  Today's Date: 12/09/2012 Time: 1530-1540 SLP Time Calculation (min): 10 min  Past Medical History:  Past Medical History  Diagnosis Date  . Hypertension   . Blood transfusion   . Diabetes mellitus   . Venous insufficiency 12/13/2011  . Cardiac arrest 12/07/2012  . OSA (obstructive sleep apnea) 12/07/2012  . Anxiety   . Acute renal insufficiency 12/08/2012   Past Surgical History:  Past Surgical History  Procedure Laterality Date  . Knee arthroscopy    . Esophagogastroduodenoscopy Left 11/26/2012    Procedure: ESOPHAGOGASTRODUODENOSCOPY (EGD);  Surgeon: Jeffrey Horner, MD;  Location: [facility] ENDOSCOPY;  Service: Endoscopy;  Laterality: Left;   HPI:  15 M admitted via EMS > ED. Family had found him "not breathing right". Shortly before EMS arrived patient's mother checked on him and he did not seem to be breathing. She did hear him snoring approximately 5 minutes before this though. On EMS arrival patient was without pulses and apneic. AED placed and did not recommend shock. King Airway placed and 4 minutes of chest compressions and epi x2 with ROSC. EDP replaced king airway with ETT. PCCM asked to admit to CCU for ? Hypothermia protocol. Note he was DC'd from [facility] on 2-28 following tx for ETOH and GIB being followed by IMTS. Work up for [employer] was pending but likely has severe OSA by history .  Extubated 3/3.  Assessment / Plan / Recommendation Clinical Impression  Pt presents with normal oropharyngeal swallow function with low risk profile for dysphagia.  Recommend continue current diet (regular/thin liquids).    Diet Recommendation Regular;Thin liquid   Liquid Administration via: Straw;Cup Medication Administration: Whole meds with liquid Supervision:  (assist PRN given altered mental status)    Follow Up Recommendations  None       Jeffrey [name], [place] CCC/SLP Pager  [phone]  Jeffrey [name] 12/09/2012,4:42 PM

## 2012-12-10 DIAGNOSIS — G4733 Obstructive sleep apnea (adult) (pediatric): Secondary | ICD-10-CM

## 2012-12-10 LAB — GLUCOSE, CAPILLARY
Glucose-Capillary: 111 mg/dL — ABNORMAL HIGH (ref 70–99)
Glucose-Capillary: 107 mg/dL — ABNORMAL HIGH (ref 70–99)
Glucose-Capillary: 111 mg/dL — ABNORMAL HIGH (ref 70–99)
Glucose-Capillary: 127 mg/dL — ABNORMAL HIGH (ref 70–99)
Glucose-Capillary: 95 mg/dL (ref 70–99)

## 2012-12-10 LAB — BASIC METABOLIC PANEL
Chloride: 102 mEq/L (ref 96–112)
GFR calc Af Amer: 74 mL/min — ABNORMAL LOW (ref >90)
GFR calc non Af Amer: 64 mL/min — ABNORMAL LOW (ref >90)
Potassium: 4.7 mEq/L (ref 3.5–5.1)
Sodium: 138 mEq/L (ref 135–145)

## 2012-12-10 LAB — CBC
Hemoglobin: 8.2 g/dL — ABNORMAL LOW (ref 13.0–17.0)
RBC: 2.88 MIL/uL — ABNORMAL LOW (ref 4.22–5.81)
WBC: 16.5 10*3/uL — ABNORMAL HIGH (ref 4.0–10.5)

## 2012-12-10 MED ORDER — OXYCODONE-ACETAMINOPHEN 5-325 MG PO TABS
1.0000 | ORAL_TABLET | ORAL | Status: DC | PRN
Start: 2012-12-10 — End: 2012-12-14
  Administered 2012-12-10 – 2012-12-11 (×2): 1 via ORAL
  Filled 2012-12-10 (×2): qty 1

## 2012-12-10 MED ORDER — PHENOL 1.4 % MT LIQD
1.0000 | OROMUCOSAL | Status: DC | PRN
  Administered 2012-12-10 (×2): 1 via OROMUCOSAL
  Filled 2012-12-10: qty 177

## 2012-12-10 MED ORDER — BENZONATATE 100 MG PO CAPS
100.0000 mg | ORAL_CAPSULE | Freq: Three times a day (TID) | ORAL | Status: DC | PRN
  Administered 2012-12-10: 100 mg via ORAL
  Filled 2012-12-10: qty 1

## 2012-12-10 NOTE — Progress Notes (Signed)
Seen and examined.  Agree with Dr. Biagio Quint.  I am totally impressed by his recovery from PEA cardiac arrest.  His is an Administrator, arts.

## 2012-12-10 NOTE — Progress Notes (Signed)
PULMONARY  / CRITICAL CARE MEDICINE  Name: Jeffrey [name] MRN: [id number] DOB: [birth year]    ADMISSION DATE:  12/06/2012 PCP- Loyola Mast  REFERRING MD :  EDP  CHIEF COMPLAINT:VDRF  BRIEF PATIENT DESCRIPTION:  19 M admitted via EMS > ED. Family had found him "not breathing right". Shortly before EMS arrived patient's mother checked on him and he did not seem to be breathing. She did hear him snoring approximately 5 minutes before this though. On EMS arrival patient was without pulses and apneic. AED placed and did not recommend shock. King Airway placed and 4 minutes of chest compressions and epi x2 with ROSC. EDP replaced king airway with ETT. PCCM asked to admit to CCU for ? Hypothermia protocol. Note he was  DC'd from [facility] on 2-28 following tx for ETOH and GIB being followed by IMTS. Work up for [employer] was pending but likely has severe OSA by history CBG 80 by EMS, 60 in ED  SIGNIFICANT EVENTS / STUDIES:  3-2 CT head: NAD  LINES / TUBES: ETT 3/2 >> 3/4 Arterial cath 3/2 >> 3/4 R IJ CVL 3/2 >> 3/5  CULTURES: blood 3/2 >>   urine 3/2 >>  blood 3/4 >>  sputum 3/4 >> moderate streptococcus, beta hemolytic, not group a.  urine 3/4 >>   ANTIBIOTICS: Zosyn 3/5 - 3/5  Vanc 3/5 - 3/5   SUBJECTIVE:  Painful cough and respiration. Cough frequency improving. Did not use BIPAP over night.  VITAL SIGNS: Temp:  [98.6 F (37 C)-100.5 F (38.1 C)] 98.6 F (37 C) (03/06 0712) Pulse Rate:  [74-115] 92 (03/06 0712) Resp:  [0-26] 21 (03/06 0712) BP: (116-158)/(63-100) 122/63 mmHg (03/06 0700) SpO2:  [80 %-100 %] 100 % (03/06 0712) HEMODYNAMICS:   VENTILATOR SETTINGS:   INTAKE / OUTPUT: Intake/Output     03/05 0701 - 03/06 0700 03/06 0701 - 03/07 0700   I.V. (mL/kg) 562.8 (4.4)    Other 1600    IV Piggyback 25    Total Intake(mL/kg) 2187.8 (17.1)    Urine (mL/kg/hr) 600 (0.2)    Total Output 600     Net +1587.8          Emesis Occurrence 1 x      PHYSICAL  EXAMINATION: General:  Cognition intact, NAD after extubation Neuro: AAOx4 Follows commands and responds to questions appropriately.  HEENT: malodorous oral and/or nasal secretions. Poor dentition. May possibly lose upper left central incisor soon.  Cardiovascular:  RRR s M, anterior chest very tender.  Lungs: Clear anteriorly without wheeze Abdomen: obese, soft, non-tender Ext: warm, no edema   LABS: BMET    Component Value Date/Time   NA 138 12/10/2012 0523   K 4.7 12/10/2012 0523   CL 102 12/10/2012 0523   CO2 28 12/10/2012 0523   GLUCOSE 145* 12/10/2012 0523   BUN 9 12/10/2012 0523   CREATININE 1.30 12/10/2012 0523   CREATININE 1.59* 09/02/2011 1149   CREATININE 4.36* 10/17/2007 1248   CALCIUM 8.1* 12/10/2012 0523   CALCIUM 8.8 10/20/2007 0535   GFRNONAA 64* 12/10/2012 0523   GFRAA 74* 12/10/2012 0523    CBC    Component Value Date/Time   WBC 16.5* 12/10/2012 0523   RBC 2.88* 12/10/2012 0523   HGB 8.2* 12/10/2012 0523   HCT 24.9* 12/10/2012 0523   PLT 297 12/10/2012 0523   MCV 86.5 12/10/2012 0523   MCH 28.5 12/10/2012 0523   MCHC 32.9 12/10/2012 0523   RDW 17.3* 12/10/2012 0523   LYMPHSABS  1.7 08/18/2011 1610   MONOABS 1.6* 08/18/2011 1610   EOSABS 0.0 08/18/2011 1610   BASOSABS 0.0 08/18/2011 1610    CXR: (2/5) Improving bilateral aeration, left basilar opacity.   Problems:   Asystolic cardiac arrest  Suspect secondary to resp arrest/OSA   Acute respiratory failure   Hypoglycemia   Acute renal insufficiency   OSA (obstructive sleep apnea)   Anoxic-ischemic encephalopathy   Pulmonary edema   Diarrhea   DM2 (diabetes mellitus, type 2)   Obesity   Fever - no overt evidence of bacterial infection   ASSESSMENT / PLAN:  PULMONARY A:VDRF secondary to resp/cardiac arrest. 2/5 No respiratory distress on 4L [place]. SpO2 100% Probable OSA P:   Supplemental O2.  Cough and Pain management for BIPAP compliance.  Will require OSA eval. Follow CXR   CARDIOVASCULAR A: S/P asystolic  arrest Shock, resolved Hypertension Sinus tachycardia P:  Cont current monitoring  PRN hydralazine and metoprolol  RENAL  A: hypervolemia P:   Monitor renal panel and correct electrolytes accordingly  GASTROINTESTINAL A:  Hx of GIB Diarrhea SUP not indicated post extubation P:   Supportive Care  HEMATOLOGIC A:  Hx GIB with blood loss anemia P:  Monitor cbc   INFECTIOUS A:  Strep growing in sputum  P:   F/u cultures/sensitivities.    ENDOCRINE A: DM II - long. acting insulin PTA Refractory hypoglycemia, resolved P:   KVO IVFs Cont SSI and ACHS CBG monitoring   NEUROLOGIC A: Post anoxic encephalopathy, much improved - Follows commands and responds to questions appropriately.   P:   Supportive Care  GENERAL A. Poor dentition P. Question need for dental consult.   FPTS to assume care  PCCM will sign off. Please call if we can be of further assistance  Merton Border, MD ; [facility] [phone].  After 5:30 PM or weekends, call [phone]

## 2012-12-10 NOTE — Evaluation (Signed)
Physical Therapy Evaluation Patient Details Name: Jeffrey [name] MRN: [id number] DOB: [birth year] Today's Date: 12/10/2012 Time: [id number] PT Time Calculation (min): 22 min  PT Assessment / Plan / Recommendation Clinical Impression  Patient is a 47 yo male with OSA and alcohol use history who presented after PEA arrest 12/06/12, felt to be due to OSA respiratory arrest in conjunction with alcohol use. ETT 3/2-3/4. Presents to PT with below impairments affecting safety and independence with mobility. Will benefit physical therapy in the acute setting to maximize functional independence for safe d/c home.     PT Assessment  Patient needs continued PT services    Follow Up Recommendations  Home health PT;Supervision/Assistance - 24 hour    Does the patient have the potential to tolerate intense rehabilitation      Barriers to Discharge Decreased caregiver support unsure how much his wife can provide in the way of physical assist     Equipment Recommendations  None recommended by PT    Recommendations for Other Services     Frequency Min 3X/week    Precautions / Restrictions Precautions Precautions: Fall Restrictions Weight Bearing Restrictions: No   Pertinent Vitals/Pain Reports chest soreness when he coughs, needed me to explain that this is probably from having chest compressions      Mobility  Bed Mobility Bed Mobility: Not assessed Details for Bed Mobility Assistance: pt sitting in recliner on presentation Transfers Transfers: Sit to Stand;Stand to Sit Sit to Stand: 4: Min guard;From chair/3-in-1;With upper extremity assist;With armrests Stand to Sit: 5: Supervision;With upper extremity assist;With armrests Details for Transfer Assistance: gaurding for safety Ambulation/Gait Ambulation/Gait Assistance: 4: Min guard Ambulation Distance (Feet): 300 Feet Assistive device: None Ambulation/Gait Assistance Details: gaurding for safety  Gait Pattern: Step-through  pattern;Wide base of support Gait velocity: quick General Gait Details: wide stance with aggressive gait pattern, increased lateral sway, bumped into therapist a few times on the right with decreased awareness; ambulates at a quick pace with decreased awareness at how this might affect his heart rate and SaO2; no major LOB noted but pt did stagger a few times with decreased reaction time to correct Stairs: Yes Stairs Assistance: 4: Min assist Stairs Assistance Details (indicate cue type and reason): descended 5 steps with left rail, ascended 5 steps with right rail; min stability assist and cues for safety Stair Management Technique: One rail Left;One rail Right;Alternating pattern Number of Stairs: 5         PT Diagnosis: Abnormality of gait;Difficulty walking;Altered mental status  PT Problem List: Decreased balance;Decreased activity tolerance;Decreased mobility;Decreased cognition;Decreased safety awareness;Decreased knowledge of precautions;Cardiopulmonary status limiting activity PT Treatment Interventions: DME instruction;Gait training;Stair training;Functional mobility training;Therapeutic activities;Therapeutic exercise;Balance training;Neuromuscular re-education;Patient/family education;Cognitive remediation   PT Goals Acute Rehab PT Goals PT Goal Formulation: With patient Time For Goal Achievement: 12/17/12 Potential to Achieve Goals: Good Pt will go Supine/Side to Sit: Independently PT Goal: Supine/Side to Sit - Progress: Goal set today Pt will go Sit to Supine/Side: Independently PT Goal: Sit to Supine/Side - Progress: Goal set today Pt will go Sit to Stand: Independently PT Goal: Sit to Stand - Progress: Goal set today Pt will go Stand to Sit: Independently PT Goal: Stand to Sit - Progress: Goal set today Pt will Transfer Bed to Chair/Chair to Bed: Independently PT Transfer Goal: Bed to Chair/Chair to Bed - Progress: Goal set today Pt will Ambulate: >150  feet;Independently PT Goal: Ambulate - Progress: Goal set today Pt will Go Up / Down Stairs: 1-2 stairs;with supervision;with  rail(s) PT Goal: Up/Down Stairs - Progress: Goal set today Pt will Perform Home Exercise Program: Independently PT Goal: Perform Home Exercise Program - Progress: Goal set today Additional Goals Additional Goal #1: Pt will demonstrate decreased risk of falls with DGI 21/24.  PT Goal: Additional Goal #1 - Progress: Goal set today  Visit Information  Last PT Received On: 12/10/12 Assistance Needed: +1    Subjective Data  Subjective: I don't have any trouble with that. 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 Communication Communication: No difficulties    Cognition  Cognition Overall Cognitive Status: Impaired Area of Impairment: Safety/judgement;Awareness of deficits;Problem solving;Attention;Memory Arousal/Alertness: Awake/alert Orientation Level: Disoriented to;Time;Situation (thought it was June) Behavior During Session: [facility] for tasks performed Current Attention Level: Selective Memory Deficits: unable to recall room number after about 3 minutes time; didn't know why he was at the hospital (patient has been told what has happened to him before) Safety/Judgement: Decreased safety judgement for tasks assessed;Impulsive;Decreased awareness of need for assistance Problem Solving: minA to navigate  halls and find his room, cues to look for room numbers Cognition - Other Comments: he does endorse balance deficits and didn't want to try standing on one leg because he is diabetic? but denies that he had balance trouble before he came to the hospital    Extremity/Trunk Assessment Right Upper Extremity Assessment RUE ROM/Strength/Tone:  (see OT note) Left Upper Extremity Assessment LUE ROM/Strength/Tone:  (see OT note) Right Lower Extremity Assessment RLE ROM/Strength/Tone: WFL for tasks assessed RLE Sensation: WFL - Light Touch Left Lower Extremity Assessment LLE ROM/Strength/Tone: WFL for tasks assessed LLE Sensation: WFL - Light Touch   Balance Balance Balance Assessed: Yes High Level Balance High Level Balance Activites: Head turns High Level Balance Comments: ambulates at quick speed, did stagger a few times with head turns, slower reaction time to correct; mingaurdA-minA for safety and stability  End of Session PT - End of Session Equipment Utilized During Treatment: Gait belt Activity Tolerance: Patient tolerated treatment well Patient left: in chair;with call bell/phone within reach Nurse Communication: Mobility status  GP     [place] 12/10/2012, 12:08 PM

## 2012-12-10 NOTE — Progress Notes (Signed)
Family Medicine Teaching Service Daily Progress Note Service Page: [phone]  Subjective: patient much improved today. States chest hurts in area of compressions. Is conversant this morning and states he recognizes me from his previous hospitalization. Was up out of the bed and in chair eating breakfast.  Objective: Temp:  [98.6 F (37 C)-100.5 F (38.1 C)] 98.6 F (37 C) (03/06 0712) Pulse Rate:  [74-115] 92 (03/06 0712) Resp:  [0-26] 21 (03/06 0712) BP: (116-158)/(63-100) 122/63 mmHg (03/06 0700) SpO2:  [80 %-100 %] 100 % (03/06 0712) Exam: General: NAD, sitting up in chair eating breakfast Cardiovascular: rrr, no apparent mrg, chest tenderness over left sternal border Respiratory: CTAB, no wheezes or crackles Extremities: no edema Neuro: alert, oriented to person and place, not year  CBC BMET   Recent Labs Lab 12/08/12 0457 12/09/12 0350 12/10/12 0523  WBC 15.7* 14.1* 16.5*  HGB 9.2* 7.9* 8.2*  HCT 28.7* 24.7* 24.9*  PLT 397 316 297    Recent Labs Lab 12/08/12 0851 12/09/12 0350 12/10/12 0523  NA 136 138 138  K 5.0 4.2 4.7  CL 104 105 102  CO2 24 25 28   BUN 11 12 9   CREATININE 1.56* 1.75* 1.30  GLUCOSE 176* 123* 145*  CALCIUM 7.2* 7.4* 8.1*    BCx NGTD Resp Cx rare GPC in pairs, GPR UCx neg  Imaging/Diagnostic Tests: Dg Chest Port 1 View  12/09/2012 IMPRESSION:  1.  Slight interval improvement in vascular congestion, with residual mild left basilar opacity.  This could reflect mild pneumonia or possibly mild interstitial edema. 2.  Mild cardiomegaly.   Original Report Authenticated By: Santa Lighter, M.D.    Assessment/Plan: Patient is a 47 yo male with OSA and alcohol use history who presented after PEA arrest felt to be due to OSA respiratory arrest in conjunction with alcohol use.  1. CV: s/p PEA HTN: currently stable -continue norvasc 5 mg daily and metoprolol 25 mg BID -continue to monitor -prn hydralazine -continue ASA  2. Pulm: OSA s/p likely  respiratory arrest. Now on RA. -CPAP at night -continue O2 prn -cough suppression with chlorpheniramine, tessalon perles, chlorhexadine spray  3. Heme: history of GI bleed with anemia-recent EGD showed non-bleeding ulcer -will monitor CBC and stool for blood  4. ID: no apparent source and is afebrile at this time. WBC elevated to 16.5, possibly related to stress reaction -will f/u final cultures -will hold off on antibiotics at this time  5. MSK: pain in chest over area of compressions -transition to PO percocet for pain control today  6. Endo: DM II -treat with SSI -will consider restarting lantus once eating  -follow CBGs  7. Neuro: post-anoxic brain injury -improved today with following commands and out of bed -PT/OT c/s for level of care needs at discharge   FEN/GI: carb modified diet, KVO PPx: SQ heparin Dispo: step down, discharge pending PT/OT eval and level of care needs on discharge Code Status: full  Tommi Rumps, MD 12/10/2012, 9:21 AM

## 2012-12-10 NOTE — Progress Notes (Signed)
[facility] Progress Note Patient Name: Jeffrey [name] DOB: 1966-04-10 MRN: [id number]  Date of Service  12/10/2012   HPI/Events of Note  Continued cough s/p extubation yesterday.  Tussinex has not helped.   eICU Interventions  Plan: Tessalon perles 100 mg po tid prn Chloraseptic spray prn sore throat   Intervention Category Minor Interventions: Routine modifications to care plan (e.g. PRN medications for pain, fever)  DETERDING,ELIZABETH 12/10/2012, 1:49 AM

## 2012-12-10 NOTE — Evaluation (Signed)
Occupational Therapy Evaluation Patient Details Name: Jeffrey [name] MRN: [id number] DOB: 1965/11/17 Today's Date: 12/10/2012 Time: [id number] OT Time Calculation (min): 20 min  OT Assessment / Plan / Recommendation Clinical Impression  Pt admitted after PEA arrest 12/06/12, felt to be due to OSA respiratory arrest in conjunction with alcohol use.  ETT 3/2-3/4. Will benefit from acute OT services to address below problem list in prep for return home.    OT Assessment  Patient needs continued OT Services    Follow Up Recommendations  Supervision/Assistance - 24 hour;No OT follow up    Barriers to Discharge None    Equipment Recommendations   (tbd)    Recommendations for Other Services    Frequency  Min 2X/week    Precautions / Restrictions Precautions Precautions: Fall Restrictions Weight Bearing Restrictions: No   Pertinent Vitals/Pain See vitals    ADL  Upper Body Bathing: Simulated;Set up Where Assessed - Upper Body Bathing: Unsupported sitting Lower Body Bathing: Simulated;Supervision/safety Where Assessed - Lower Body Bathing: Unsupported sitting Upper Body Dressing: Performed;Set up Where Assessed - Upper Body Dressing: Unsupported sitting Lower Body Dressing: Performed;Supervision/safety Where Assessed - Lower Body Dressing: Unsupported sitting Toilet Transfer: Simulated;Min guard Toilet Transfer Method: Sit to Loss adjuster, chartered:  (chair to ambulating in hallway) Equipment Used: Gait belt Transfers/Ambulation Related to ADLs: Min guard ambulating in room and hallway. Ambulated with wide base of support and increased gait speed.  No LOB but pt frequently staggers while he walks with decreased awareness on right side, bumping into objects and therapist. ADL Comments: Balance deficits during standing components of ADLs.    OT Diagnosis: Generalized weakness;Cognitive deficits  OT Problem List: Impaired balance (sitting and/or standing);Decreased safety  awareness;Decreased knowledge of use of DME or AE OT Treatment Interventions: Self-care/ADL training;DME and/or AE instruction;Therapeutic activities;Patient/family education;Balance training;Cognitive remediation/compensation   OT Goals Acute Rehab OT Goals OT Goal Formulation: With patient Time For Goal Achievement: 12/17/12 Potential to Achieve Goals: Good ADL Goals Pt Will Perform Grooming: Independently;Standing at sink ADL Goal: Grooming - Progress: Goal set today Pt Will Transfer to Toilet: with modified independence;Ambulation;Regular height toilet ADL Goal: Toilet Transfer - Progress: Goal set today Miscellaneous OT Goals Miscellaneous OT Goal #1: Pt will retrieve ADL items at mod I level. OT Goal: Miscellaneous Goal #1 - Progress: Goal set today  Visit Information  Last OT Received On: 12/10/12 Assistance Needed: +1 PT/OT Co-Evaluation/Treatment: Yes    Subjective Data      Prior Functioning     Home Living Lives With: Family;Spouse Available Help at Discharge: Available 24 hours/day Type of Home: House Home Access: Stairs to enter [employer] of Steps: 2 Entrance Stairs-Rails: Right;Can reach both;Left Home Layout: One level Bathroom Shower/Tub: Chiropodist: Standard Home Adaptive Equipment: None Prior Function Level of Independence: Independent Able to Take Stairs?: Yes Driving: Yes Vocation: Full time employment Comments: works as a Physicist, medical, 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 Communication Communication: No difficulties         Vision/Perception     Cognition  Cognition Overall Cognitive Status: Impaired Area of Impairment: Safety/judgement;Awareness of deficits;Problem solving;Attention;Memory Arousal/Alertness: Awake/alert Orientation Level: Disoriented to;Time;Situation (thought it was June) Behavior During Session: [facility] for tasks performed Current Attention Level: Selective Memory Deficits: unable to  recall room number after about 3 minutes time Safety/Judgement: Decreased safety judgement for tasks assessed;Impulsive;Decreased awareness of need for assistance Problem Solving: minA to navigate halls and find his room, cues to look for room numbers Cognition -  Other Comments: he does endorse balance deficits and didn't want to try standing on one leg because he is diabetic? but denies that he had balance trouble before he came to the hospital    Extremity/Trunk Assessment Right Upper Extremity Assessment RUE ROM/Strength/Tone: Within functional levels Left Upper Extremity Assessment LUE ROM/Strength/Tone: Within functional levels Right Lower Extremity Assessment RLE ROM/Strength/Tone: WFL for tasks assessed RLE Sensation: WFL - Light Touch Left Lower Extremity Assessment LLE ROM/Strength/Tone: WFL for tasks assessed LLE Sensation: WFL - Light Touch     Mobility Bed Mobility Bed Mobility: Not assessed Details for Bed Mobility Assistance: pt sitting in recliner on presentation Transfers Transfers: Sit to Stand;Stand to Sit Sit to Stand: 4: Min guard;From chair/3-in-1;With upper extremity assist;With armrests Stand to Sit: 5: Supervision;With upper extremity assist;With armrests Details for Transfer Assistance: gaurding for safety     Exercise     Balance Balance Balance Assessed: Yes High Level Balance High Level Balance Activites: Head turns High Level Balance Comments: ambulates at quick speed, did stagger a few times with head turns, slower reaction time to correct; mingaurdA-minA for safety and stability   End of Session OT - End of Session Equipment Utilized During Treatment: Gait belt Activity Tolerance: Patient tolerated treatment well Patient left: in chair;with call bell/phone within reach Nurse Communication: Mobility status  GO   12/10/2012 Darrol Jump OTR/L Pager [phone] Office [phone]   Darrol Jump 12/10/2012, 1:07 PM

## 2012-12-11 DIAGNOSIS — I872 Venous insufficiency (chronic) (peripheral): Secondary | ICD-10-CM

## 2012-12-11 DIAGNOSIS — I10 Essential (primary) hypertension: Secondary | ICD-10-CM

## 2012-12-11 DIAGNOSIS — E669 Obesity, unspecified: Secondary | ICD-10-CM

## 2012-12-11 LAB — GLUCOSE, CAPILLARY
Glucose-Capillary: 122 mg/dL — ABNORMAL HIGH (ref 70–99)
Glucose-Capillary: 171 mg/dL — ABNORMAL HIGH (ref 70–99)
Glucose-Capillary: 155 mg/dL — ABNORMAL HIGH (ref 70–99)

## 2012-12-11 LAB — BASIC METABOLIC PANEL
CO2: 22 mEq/L (ref 19–32)
Calcium: 8.5 mg/dL (ref 8.4–10.5)
Creatinine, Ser: 1.06 mg/dL (ref 0.50–1.35)

## 2012-12-11 LAB — CBC
MCH: 27.7 pg (ref 26.0–34.0)
MCHC: 32.4 g/dL (ref 30.0–36.0)
MCV: 85.5 fL (ref 78.0–100.0)
Platelets: 266 10*3/uL (ref 150–400)
RBC: 2.89 MIL/uL — ABNORMAL LOW (ref 4.22–5.81)
RDW: 16.8 % — ABNORMAL HIGH (ref 11.5–15.5)

## 2012-12-11 LAB — CULTURE, RESPIRATORY W GRAM STAIN

## 2012-12-11 MED ORDER — PHENOL 1.4 % MT LIQD: 1.0000 | OROMUCOSAL | Status: DC | PRN

## 2012-12-11 MED ORDER — BENZONATATE 100 MG PO CAPS: 100.0000 mg | ORAL_CAPSULE | Freq: Three times a day (TID) | ORAL | Status: DC | PRN

## 2012-12-11 MED ORDER — OXYCODONE-ACETAMINOPHEN 5-325 MG PO TABS: 1.0000 | ORAL_TABLET | ORAL | Status: DC | PRN

## 2012-12-11 MED ORDER — AMLODIPINE BESYLATE 10 MG PO TABS
10.0000 mg | ORAL_TABLET | Freq: Every day | ORAL | Status: DC
  Administered 2012-12-11 – 2012-12-14 (×4): 10 mg via ORAL
  Filled 2012-12-11 (×5): qty 1

## 2012-12-11 NOTE — Progress Notes (Signed)
Physical Therapy Treatment Patient Details Name: Jeffrey [name] MRN: [id number] DOB: [birth year] Today's Date: 12/11/2012 Time: 1030-1046 PT Time Calculation (min): 16 min  PT Assessment / Plan / Recommendation Comments on Treatment Session  pt is improved in stability in gait.  He will still benefit from LE strengthening for balance improvement.  Pt instructed to do sit to stand and standing knee raises and LE abduction on his own at home.  Feel he now  is at too high levle for HHPT    Follow Up Recommendations  No PT follow up     Does the patient have the potential to tolerate intense rehabilitation     Barriers to Discharge        Equipment Recommendations  None recommended by PT    Recommendations for Other Services    Frequency Min 3X/week   Plan Discharge plan needs to be updated;Frequency remains appropriate    Precautions / Restrictions     Pertinent Vitals/Pain C/o chest pain with coughing    Mobility  Bed Mobility Bed Mobility: Not assessed;Sit to Supine Sit to Supine: 7: Independent Details for Bed Mobility Assistance: pt sitting in recliner on presentation Transfers Transfers: Sit to Stand;Stand to Sit Sit to Stand: 7: Independent Stand to Sit: 7: Independent Ambulation/Gait Ambulation/Gait Assistance: 7: Independent Ambulation Distance (Feet): 400 Feet Assistive device: None Ambulation/Gait Assistance Details: no assistance needed Gait Pattern: Step-through pattern;Wide base of support;Within Functional Limits Gait velocity: WFL General Gait Details: pt is able to walk without device, change head postiion, change speeds, sudden stops all without balance loss Stairs: Yes Stairs Assistance: 6: Modified independent (Device/Increase time) Stairs Assistance Details (indicate cue type and reason): pt with some impulsivity, but no balance loss on steps Stair Management Technique: One rail Left;One rail Right;Alternating pattern Number of Stairs: 2     Exercises General Exercises - Lower Extremity Gluteal Sets: AROM;Both;5 reps;Standing Hip ABduction/ADduction: AROM;Right;Left;5 reps;Standing;Limitations Hip Abduction/Adduction Limitations: needs to hold onto counter top for balance assist Hip Flexion/Marching: AROM;Right;Left;Standing;5 reps (needs to hold onto counter top for balance assist) Other Exercises Other Exercises: instructed to splint with folded blanket across chest, but pt continues to have pain Other Exercises: repeated sit to stand x 5 reps without UE assist   PT Diagnosis:    PT Problem List:   PT Treatment Interventions:     PT Goals    Visit Information  Last PT Received On: 12/11/12 Assistance Needed: +1    Subjective Data  Subjective: pt c/o chest hurting and not understanding what happened to him. Patient Stated Goal: to get some sleep (pt knows he needs to wear his CPAP but he doesn't understand why)   Cognition  Cognition Overall Cognitive Status: Appears within functional limits for tasks assessed/performed Area of Impairment: Attention Arousal/Alertness: Awake/alert Orientation Level: Appears intact for tasks assessed Behavior During Session: [facility] for tasks performed    Balance  High Level Balance High Level Balance Activites: Direction changes;Turns;Sudden stops;Head turns High Level Balance Comments: pt had some difficulty when asked to read room numbers and look up at ceiling when walking.  Because he was unsure, he stopped to complete task, but did not lose balance and continued on  End of Session PT - End of Session Activity Tolerance: Patient tolerated treatment well Patient left: with call bell/phone within reach;in bed Nurse Communication: Mobility status   GP    Donato Heinz. [place], [place] 12/11/2012, 11:08 AM

## 2012-12-11 NOTE — Discharge Summary (Signed)
Physician Discharge Summary  Patient ID: Jeffrey [name] MRN: [id number] DOB: 1966-07-24 Age: 47 y.o.  Admit date: 12/06/2012 Discharge date: 12/14/2012 Admitting Physician: Rigoberto Noel, MD  PCP: Teresa Coombs, DO  Consultants: PCCM  Discharge Diagnosis: Principal Problem:   Cardiac arrest Active Problems:   Acute respiratory failure   Hypoglycemia   Diarrhea   DM2 (diabetes mellitus, type 2)   Obesity   OSA (obstructive sleep apnea)   Anoxic-ischemic encephalopathy   Acute renal insufficiency   Pulmonary edema    Hospital Course Patient is a 47 yo male with OSA and alcohol use history who presented after PEA arrest felt to be due to OSA respiratory arrest in conjunction with alcohol use.   1. CV: Patient presented following PEA arrest at home and admission to the ICU. Once in the ICU patient was placed on hypothermia protocol, on pressors, sedation, and intubated. Patient was slowly weaned off these aspects of care. Continued to progress well following coming off these interventions and was stable at time of discharge from a cardiac stand point. Once extubated and BP recovered from shock levels patient was restarted on home BP medications metoprolol and norvasc. His BP was elevated prior to discharge and norvasc was increased to 10 mg daily. Continued home ASA  2. Pulm: had ventilator dependent respiratory failure secondary to respiratory arrest which led to cardiac arrest. Respiratory arrest likely due to OSA and no CPAP at home. Once extubated patient was transitioned to BiPAP and then to CPAP. He was set up with this for home use through home health agency and has a sleep study scheduled. Following extubation patient had issues with cough and was placed on chlorpheneramine, tessalon, and chlorhexadine. Additionally following extubation patient developed fever and was found to have infiltrates on CXR. He was started on vanc and zosyn for this, though these were discontinued by CCM  given absence of definitive site of infection. Patient was stable from a respiratory stand point at time of discharge.  3. Heme: history of GI bleed with anemia-recent EGD showed non-bleeding ulcer. Hgb stable throughout hospitalization.    4. ID: patient with fever post-extubation. BCx and UCx show no growth. 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 Was initially started on vanc and zosyn, though these were discontinued as there was no apparent source. Patient was afebrile and without elevated WBC at time of discharge.  5. MSK: patient developed pain in chest over area of compressions. Patient on morphine for pain initially and transitioned to percocet. By time of discharge pain was improved and close to resolved per patient.  6. Endo: DM II treated with SSI in hospital.  7. Neuro: post-anoxic brain injury. Patient with memory difficulties initially following extubation. These progressively improved per the patient, though he stated he was not at his baseline at time of discharge. PT evaluated patient and recommended no PT f/u.  Problem List 1. PEA arrest 2. Respiratory failure 3. Anoxic brain injury 4. OSA 5. DM II 6. Anemia   7. AKI        Discharge PE   Filed Vitals:   12/14/12 1345  BP: 154/100  Pulse: 96  Temp: 98.3 F (36.8 C)  Resp: 20   General: NAD, laying in bed  HEENT: AT, [place]. MMM  Cardiovascular: RRR  Respiratory: CTAB  Extremities: no edema  Neuro: Grossly intact. Answers questions appropriately.   Procedures/Imaging:  Ct Head Wo Contrast  12/06/2012  *RADIOLOGY REPORT*  Clinical  Data: Unwitnessed arrest  CT HEAD WITHOUT CONTRAST  Technique:  Contiguous axial images were obtained from the base of the skull through the vertex without contrast.  Comparison: 11/19/2010  Findings: No evidence of parenchymal hemorrhage or extra-axial fluid collection. No mass lesion, mass effect, or midline shift.  No CT evidence of acute  infarction.  Cerebral volume is age appropriate.  No ventriculomegaly.  Mucosal thickening/partial opacification of the bilateral ethmoid and maxillary sinuses.  The mastoid air cells are clear.  No evidence of calvarial fracture.  IMPRESSION: No evidence of acute intracranial abnormality.   Original Report Authenticated By: Julian Hy, M.D.    Dg Chest Port 1 View  12/09/2012  *RADIOLOGY REPORT*  Clinical Data: Cough and congestion; follow up respiratory failure.  PORTABLE CHEST - 1 VIEW  Comparison: Chest radiograph performed 12/08/2012  Findings: The patient's right IJ line is noted ending about the mid SVC.  Lung expansion is mildly improved.  Mild left basilar opacity could reflect mild pneumonia; underlying vascular crowding and vascular congestion are seen, and mild interstitial edema cannot be excluded.  This appears mildly improved from the prior study.  No definite pleural effusion or pneumothorax is seen.  The cardiomediastinal silhouette is mildly enlarged.  No acute osseous abnormalities are identified.  IMPRESSION:  1.  Slight interval improvement in vascular congestion, with residual mild left basilar opacity.  This could reflect mild pneumonia or possibly mild interstitial edema. 2.  Mild cardiomegaly.   Original Report Authenticated By: Santa Lighter, M.D.    Dg Chest Port 1 View  12/08/2012  *RADIOLOGY REPORT*  Clinical Data: Check ET tube position  PORTABLE CHEST - 1 VIEW  Comparison: 12/07/2012  Findings: Endotracheal tube terminates 2 cm above the carina.  Stable right IJ venous catheter.  Enteric tube courses below the diaphragm.  Defibrillator pad overlies the left hemithorax.  Patchy opacities in the left perihilar region and bilateral lower lobes, possibly reflecting interstitial edema and/or multifocal pneumonia.  No definite pleural effusion.  No pneumothorax.  Cardiomegaly  IMPRESSION: Endotracheal tube terminates 2 cm above the carina.  Patchy opacities in the left perihilar  region and bilateral lower lobes, possibly reflecting interstitial edema and/or multifocal pneumonia.   Original Report Authenticated By: Julian Hy, M.D.    Portable Chest Xray In Am  12/07/2012  *RADIOLOGY REPORT*  Clinical Data: Respiratory and cardiac arrest.  PORTABLE CHEST - 1 VIEW  Comparison: 12/06/2012  Findings: The endotracheal tube tip is now approximately 1.4 cm above the carina.  There is some progressive consolidation of the left mid and lower lung and the right lower lung suggestive of bilateral pneumonia.  There may also be a component of pulmonary edema present.  No significant pleural fluid is identified.  The heart size is stable.  Right jugular central line positioning stable.  IMPRESSION: Progressive consolidation in both lungs suggestive of bilateral pneumonia.   Original Report Authenticated By: Aletta Edouard, M.D.    Dg Chest Portable 1 View  12/06/2012  *RADIOLOGY REPORT*  Clinical Data: Cardiac arrest  PORTABLE CHEST - 1 VIEW  Comparison: 12/06/2012 at 1059 hours  Findings: Cardiomegaly with mild interstitial edema.  Endotracheal tube terminates 3.5 cm above the carina.  Enteric tube is looped in the stomach.  Interval placement of a right IJ venous catheter which terminates in the mid SVC.  No pneumothorax.  IMPRESSION: Interval placement of a right IJ venous catheter which terminates in the mid SVC.  No pneumothorax.  Otherwise unchanged.   Original Report  Authenticated By: Julian Hy, M.D.    Dg Chest Portable 1 View  12/06/2012  *RADIOLOGY REPORT*  Clinical Data: Respiratory arrest, intubated  PORTABLE CHEST - 1 VIEW  Comparison: 11/19/2010  Findings: Endotracheal tube terminates 3 cm above the carina.  Cardiomegaly with mild interstitial edema.  No pleural effusion or pneumothorax.  Enteric tube is looped in the stomach.  IMPRESSION: Endotracheal tube terminates 3 cm above the carina.  Cardiomegaly with mild interstitial edema.   Original Report Authenticated By:  Julian Hy, M.D.     Labs  CBC  Recent Labs Lab 12/09/12 0350 12/10/12 0523 12/11/12 0635  WBC 14.1* 16.5* 10.4  HGB 7.9* 8.2* 8.0*  HCT 24.7* 24.9* 24.7*  PLT 316 297 266   BMET  Recent Labs Lab 12/06/12 1047  12/09/12 0350 12/10/12 0523 12/11/12 0635  NA 141  < > 138 138 136  K 3.7  < > 4.2 4.7 4.2  CL 104  < > 105 102 98  CO2 21  < > 25 28 22   BUN 6  < > 12 9 6   CREATININE 1.21  < > 1.75* 1.30 1.06  CALCIUM 8.1*  < > 7.4* 8.1* 8.5  PROT 6.6  --   --   --   --   BILITOT 0.2*  --   --   --   --   ALKPHOS 103  --   --   --   --   ALT 50  --   --   --   --   AST 89*  --   --   --   --   GLUCOSE 61*  < > 123* 145* 128*  < > = values in this interval not displayed. Results for orders placed during the hospital encounter of 12/06/12 (from the past 72 hour(s))  URINALYSIS, ROUTINE W REFLEX MICROSCOPIC     Status: Abnormal   Collection Time    12/08/12  2:55 PM      Result Value Range   Color, Urine YELLOW  YELLOW   APPearance HAZY (*) CLEAR   Specific Gravity, Urine 1.013  1.005 - 1.030   pH 5.0  5.0 - 8.0   Glucose, UA NEGATIVE  NEGATIVE mg/dL   Hgb urine dipstick LARGE (*) NEGATIVE   Bilirubin Urine NEGATIVE  NEGATIVE   Ketones, ur NEGATIVE  NEGATIVE mg/dL   Protein, ur NEGATIVE  NEGATIVE mg/dL   Urobilinogen, UA 0.2  0.0 - 1.0 mg/dL   Nitrite NEGATIVE  NEGATIVE   Leukocytes, UA TRACE (*) NEGATIVE  URINE MICROSCOPIC-ADD ON     Status: Abnormal   Collection Time    12/08/12  2:55 PM      Result Value Range   Squamous Epithelial / LPF RARE  RARE   WBC, UA 0-2  <3 WBC/hpf   RBC / HPF 11-20  <3 RBC/hpf   Bacteria, UA FEW (*) RARE   Casts HYALINE CASTS (*) NEGATIVE   Comment: GRANULAR CAST   Urine-Other MUCOUS PRESENT    CULTURE, RESPIRATORY (NON-EXPECTORATED)     Status: None   Collection Time    12/08/12  3:00 PM      Result Value Range   Specimen Description TRACHEAL ASPIRATE     Special Requests NONE     Gram Stain       Value: ABUNDANT WBC  PRESENT, PREDOMINANTLY PMN     RARE SQUAMOUS EPITHELIAL CELLS PRESENT     RARE GRAM POSITIVE COCCI  IN PAIRS RARE GRAM POSITIVE RODS   Culture MODERATE STREPTOCOCCUS,BETA [place] NOT GROUP A     Report Status 12/11/2012 FINAL    CULTURE, BLOOD (ROUTINE X 2)     Status: None   Collection Time    12/08/12  3:50 PM      Result Value Range   Specimen Description BLOOD RIGHT HAND     Special Requests BOTTLES DRAWN AEROBIC AND ANAEROBIC 10CC     Culture  Setup Time 12/08/2012 21:29     Culture       Value:        BLOOD CULTURE RECEIVED NO GROWTH TO DATE CULTURE WILL BE HELD FOR 5 DAYS BEFORE ISSUING A FINAL NEGATIVE REPORT   Report Status PENDING    CULTURE, BLOOD (ROUTINE X 2)     Status: None   Collection Time    12/08/12  4:02 PM      Result Value Range   Specimen Description BLOOD RIGHT HAND     Special Requests BOTTLES DRAWN AEROBIC ONLY 10CC     Culture  Setup Time 12/08/2012 21:29     Culture       Value:        BLOOD CULTURE RECEIVED NO GROWTH TO DATE CULTURE WILL BE HELD FOR 5 DAYS BEFORE ISSUING A FINAL NEGATIVE REPORT   Report Status PENDING    GLUCOSE, CAPILLARY     Status: Abnormal   Collection Time    12/08/12 10:14 PM      Result Value Range   Glucose-Capillary 111 (*) 70 - 99 mg/dL  TROPONIN I     Status: None   Collection Time    12/09/12  2:23 AM      Result Value Range   Troponin I <0.30  <0.30 ng/mL   Comment:            Due to the release kinetics of cTnI,     a negative result within the first hours     of the onset of symptoms does not rule out     myocardial infarction with certainty.     If myocardial infarction is still suspected,     repeat the test at appropriate intervals.  GLUCOSE, CAPILLARY     Status: Abnormal   Collection Time    12/09/12  3:41 AM      Result Value Range   Glucose-Capillary 107 (*) 70 - 99 mg/dL  BASIC METABOLIC PANEL     Status: Abnormal   Collection Time    12/09/12  3:50 AM      Result Value Range   Sodium 138  135  - 145 mEq/L   Potassium 4.2  3.5 - 5.1 mEq/L   Chloride 105  96 - 112 mEq/L   CO2 25  19 - 32 mEq/L   Glucose, Bld 123 (*) 70 - 99 mg/dL   BUN 12  6 - 23 mg/dL   Creatinine, Ser 1.75 (*) 0.50 - 1.35 mg/dL   Calcium 7.4 (*) 8.4 - 10.5 mg/dL   GFR calc non Af Amer 44 (*) >90 mL/min   GFR calc Af Amer 51 (*) >90 mL/min   Comment:            The eGFR has been calculated     using the CKD EPI equation.     This calculation has not been     validated in all clinical     situations.     eGFR's persistently     <  90 mL/min signify     possible Chronic Kidney Disease.  CBC     Status: Abnormal   Collection Time    12/09/12  3:50 AM      Result Value Range   WBC 14.1 (*) 4.0 - 10.5 K/uL   RBC 2.82 (*) 4.22 - 5.81 MIL/uL   Hemoglobin 7.9 (*) 13.0 - 17.0 g/dL   HCT 24.7 (*) 39.0 - 52.0 %   MCV 87.6  78.0 - 100.0 fL   MCH 28.0  26.0 - 34.0 pg   MCHC 32.0  30.0 - 36.0 g/dL   RDW 17.4 (*) 11.5 - 15.5 %   Platelets 316  150 - 400 K/uL  TROPONIN I     Status: None   Collection Time    12/09/12  9:45 AM      Result Value Range   Troponin I <0.30  <0.30 ng/mL   Comment:            Due to the release kinetics of cTnI,     a negative result within the first hours     of the onset of symptoms does not rule out     myocardial infarction with certainty.     If myocardial infarction is still suspected,     repeat the test at appropriate intervals.  TROPONIN I     Status: None   Collection Time    12/09/12  2:23 PM      Result Value Range   Troponin I <0.30  <0.30 ng/mL   Comment:            Due to the release kinetics of cTnI,     a negative result within the first hours     of the onset of symptoms does not rule out     myocardial infarction with certainty.     If myocardial infarction is still suspected,     repeat the test at appropriate intervals.  GLUCOSE, CAPILLARY     Status: Abnormal   Collection Time    12/09/12  4:45 PM      Result Value Range   Glucose-Capillary 109 (*)  70 - 99 mg/dL  GLUCOSE, CAPILLARY     Status: Abnormal   Collection Time    12/09/12  9:55 PM      Result Value Range   Glucose-Capillary 111 (*) 70 - 99 mg/dL  BASIC METABOLIC PANEL     Status: Abnormal   Collection Time    12/10/12  5:23 AM      Result Value Range   Sodium 138  135 - 145 mEq/L   Potassium 4.7  3.5 - 5.1 mEq/L   Chloride 102  96 - 112 mEq/L   CO2 28  19 - 32 mEq/L   Glucose, Bld 145 (*) 70 - 99 mg/dL   BUN 9  6 - 23 mg/dL   Creatinine, Ser 1.30  0.50 - 1.35 mg/dL   Calcium 8.1 (*) 8.4 - 10.5 mg/dL   GFR calc non Af Amer 64 (*) >90 mL/min   GFR calc Af Amer 74 (*) >90 mL/min   Comment:            The eGFR has been calculated     using the CKD EPI equation.     This calculation has not been     validated in all clinical     situations.     eGFR's persistently     <90 mL/min signify  possible Chronic Kidney Disease.  CBC     Status: Abnormal   Collection Time    12/10/12  5:23 AM      Result Value Range   WBC 16.5 (*) 4.0 - 10.5 K/uL   RBC 2.88 (*) 4.22 - 5.81 MIL/uL   Hemoglobin 8.2 (*) 13.0 - 17.0 g/dL   HCT 24.9 (*) 39.0 - 52.0 %   MCV 86.5  78.0 - 100.0 fL   MCH 28.5  26.0 - 34.0 pg   MCHC 32.9  30.0 - 36.0 g/dL   RDW 17.3 (*) 11.5 - 15.5 %   Platelets 297  150 - 400 K/uL  GLUCOSE, CAPILLARY     Status: Abnormal   Collection Time    12/10/12  7:11 AM      Result Value Range   Glucose-Capillary 127 (*) 70 - 99 mg/dL  GLUCOSE, CAPILLARY     Status: Abnormal   Collection Time    12/10/12 11:29 AM      Result Value Range   Glucose-Capillary 145 (*) 70 - 99 mg/dL  GLUCOSE, CAPILLARY     Status: None   Collection Time    12/10/12  4:46 PM      Result Value Range   Glucose-Capillary 96  70 - 99 mg/dL  GLUCOSE, CAPILLARY     Status: None   Collection Time    12/10/12  8:37 PM      Result Value Range   Glucose-Capillary 95  70 - 99 mg/dL  GLUCOSE, CAPILLARY     Status: Abnormal   Collection Time    12/11/12  6:18 AM      Result Value Range    Glucose-Capillary 122 (*) 70 - 99 mg/dL  CBC     Status: Abnormal   Collection Time    12/11/12  6:35 AM      Result Value Range   WBC 10.4  4.0 - 10.5 K/uL   RBC 2.89 (*) 4.22 - 5.81 MIL/uL   Hemoglobin 8.0 (*) 13.0 - 17.0 g/dL   HCT 24.7 (*) 39.0 - 52.0 %   MCV 85.5  78.0 - 100.0 fL   MCH 27.7  26.0 - 34.0 pg   MCHC 32.4  30.0 - 36.0 g/dL   RDW 16.8 (*) 11.5 - 15.5 %   Platelets 266  150 - 400 K/uL  BASIC METABOLIC PANEL     Status: Abnormal   Collection Time    12/11/12  6:35 AM      Result Value Range   Sodium 136  135 - 145 mEq/L   Potassium 4.2  3.5 - 5.1 mEq/L   Chloride 98  96 - 112 mEq/L   CO2 22  19 - 32 mEq/L   Glucose, Bld 128 (*) 70 - 99 mg/dL   BUN 6  6 - 23 mg/dL   Creatinine, Ser 1.06  0.50 - 1.35 mg/dL   Calcium 8.5  8.4 - 10.5 mg/dL   GFR calc non Af Amer 81 (*) >90 mL/min   GFR calc Af Amer >90  >90 mL/min   Comment:            The eGFR has been calculated     using the CKD EPI equation.     This calculation has not been     validated in all clinical     situations.     eGFR's persistently     <90 mL/min signify     possible Chronic Kidney Disease.  GLUCOSE, CAPILLARY     Status: Abnormal   Collection Time    12/11/12 11:21 AM      Result Value Range   Glucose-Capillary 171 (*) 70 - 99 mg/dL       Patient condition at time of discharge/disposition: stable  Disposition-home   Follow up issues: 1. Continued use of CPAP and counseling on decreased consumption of alcohol.  2. Discontinued trazodone and recommend not starting any additional sleep aids. 3. Check A1c as outpatient  Discharge follow up:  Follow-up Information   Follow up with Kathee Delton, MD On 01/01/2013. (Appt at 11:30 - for Sleep Apnea follow up.  Will decide on timing of sleep study at that appointment. )    Contact information:   [facility]  [address]  [place] [address] [phone]       Follow up with RIGBY, MICHAEL, DO On 12/23/2012. (1:30 pm)     Contact information:   [address] [place] [address] [phone]      Note sent to admin pool to reschedule this PCP appointment for some time this week.  Discharge Orders   Future Appointments Provider Department Dept Phone   12/23/2012 1:30 PM Gerda Diss, [place] [phone]   12/29/2012 8:00 PM [facility] [place]   01/01/2013 11:30 AM Kathee Delton, MD [facility] [phone]   Future Orders Complete By Expires     Call MD for:  difficulty breathing, headache or visual disturbances  As directed     Call MD for:  extreme fatigue  As directed     Call MD for:  hives  As directed     Call MD for:  persistant dizziness or light-headedness  As directed     Call MD for:  persistant nausea and vomiting  As directed     Call MD for:  redness, tenderness, or signs of infection (pain, swelling, redness, odor or green/yellow discharge around incision site)  As directed     Call MD for:  severe uncontrolled pain  As directed     Call MD for:  temperature >100.4  As directed     Diet - low sodium heart healthy  As directed     Increase activity slowly  As directed       Discharge Instructions: Please refer to Patient Instructions section of EMR for full details.  Patient was counseled important signs and symptoms that should prompt return to medical care, changes in medications, dietary instructions, activity restrictions, and follow up appointments.   Discharge Medications   Medication List    STOP taking these medications       traZODone 50 MG tablet  Commonly known as:  DESYREL      TAKE these medications       amLODipine 5 MG tablet  Commonly known as:  NORVASC  Take 1 tablet (5 mg total) by mouth daily.     aspirin 81 MG EC tablet  Commonly known as:  ADULT ASPIRIN EC LOW STRENGTH  Take 1 tablet (81 mg total) by mouth daily.     benzonatate 100 MG capsule   Commonly known as:  TESSALON  Take 1 capsule (100 mg total) by mouth 3 (three) times daily as needed for cough.     furosemide 40 MG tablet  Commonly known as:  LASIX  Take 0.5 tablets (20 mg total) by mouth daily.  insulin aspart 100 UNIT/ML injection  Commonly known as:  NOVOLOG FLEXPEN  Inject 5-10 units before meals as directed by your sliding scale.     insulin glargine 100 UNIT/ML injection  Commonly known as:  LANTUS  Inject 70 Units into the skin daily.     losartan 50 MG tablet  Commonly known as:  COZAAR  Take 1 tablet (50 mg total) by mouth daily.     metoprolol succinate 100 MG 24 hr tablet  Commonly known as:  TOPROL-XL  Take 1 tablet (100 mg total) by mouth daily.     omeprazole 20 MG tablet  Commonly known as:  PRILOSEC OTC  Take 20 mg by mouth daily.     oxyCODONE-acetaminophen 5-325 MG per tablet  Commonly known as:  PERCOCET/ROXICET  Take 1-2 tablets by mouth every 4 (four) hours as needed.     phenol 1.4 % Liqd  Commonly known as:  CHLORASEPTIC  Use as directed 1 spray in the mouth or throat as needed.     pravastatin 80 MG tablet  Commonly known as:  PRAVACHOL  Take 1 tablet (80 mg total) by mouth at bedtime. for Cholesterol     sildenafil 100 MG tablet  Commonly known as:  VIAGRA  Take 1 tablet (100 mg total) by mouth as needed for erectile dysfunction. Take 1 tablet 30 minutes to 4 hours before intercourse       Tommi Rumps, MD of [name] 12/11/2012 8:53 PM

## 2012-12-11 NOTE — Progress Notes (Signed)
Family Medicine Teaching Service Daily Progress Note Service Page: [phone]  Subjective: continues to be improved. States doing well. Doesn't remember what happened. Explained what happened and patient seemed to not understand how his stopping breathing could have led to this since he stops breathing all the time. Does not have CPAP at home.  Objective: Temp:  [98.2 F (36.8 C)-99.6 F (37.6 C)] 98.9 F (37.2 C) (03/07 0358) Pulse Rate:  [79-118] 86 (03/07 0358) Resp:  [10-20] 20 (03/07 0358) BP: (130-178)/(77-95) 178/83 mmHg (03/07 0358) SpO2:  [91 %-97 %] 92 % (03/07 0358) Exam: General: NAD, laying down in bed Cardiovascular: rrr, no apparent mrg, chest tenderness over left sternal border Respiratory: CTAB, no wheezes or crackles Extremities: no edema  CBC BMET   Recent Labs Lab 12/09/12 0350 12/10/12 0523 12/11/12 0635  WBC 14.1* 16.5* 10.4  HGB 7.9* 8.2* 8.0*  HCT 24.7* 24.9* 24.7*  PLT 316 297 266    Recent Labs Lab 12/09/12 0350 12/10/12 0523 12/11/12 0635  NA 138 138 136  K 4.2 4.7 4.2  CL 105 102 98  CO2 25 28 22   BUN 12 9 6   CREATININE 1.75* 1.30 1.06  GLUCOSE 123* 145* 128*  CALCIUM 7.4* 8.1* 8.5    BCx NGTD Resp Cx beta hemolytic strep moderate UCx neg  Imaging/Diagnostic Tests: Dg Chest Port 1 View  12/09/2012 IMPRESSION:  1.  Slight interval improvement in vascular congestion, with residual mild left basilar opacity.  This could reflect mild pneumonia or possibly mild interstitial edema. 2.  Mild cardiomegaly.   Original Report Authenticated By: Santa Lighter, M.D.    Assessment/Plan: Patient is a 47 yo male with OSA and alcohol use history who presented after PEA arrest felt to be due to OSA respiratory arrest in conjunction with alcohol use.  1. CV: s/p PEA HTN: elevated this morning -continue metoprolol 25 mg BID -will increase norvasc to 10 mg -continue to monitor -prn hydralazine -continue ASA  2. Pulm: OSA s/p likely respiratory  arrest. Now on RA. -CPAP at night-care mgmt to help setting up home CPAP -continue O2 prn -cough suppression with chlorpheniramine, tessalon perles, chlorhexadine spray  3. Heme: history of GI bleed with anemia-recent EGD showed non-bleeding ulcer-hgb stable -will monitor CBC and stool for blood  4. ID: no apparent source and is afebrile at this time. WBC elevated to 16.5, possibly related to stress reaction -will f/u final cultures -will hold off on antibiotics at this time  5. MSK: pain in chest over area of compressions -transition to PO percocet for pain control today  6. Endo: DM II -treat with SSI -will consider restarting lantus once eating  -follow CBGs  7. Neuro: post-anoxic brain injury -improved today with following commands and out of bed -PT/OT recs home health PT and 24 hr supervision-care mgmt c/s for PT  FEN/GI: carb modified diet, KVO PPx: SQ heparin Dispo: potentially d/c today if patient has 24 hour supervision at home Code Status: full  Tommi Rumps, MD 12/11/2012, 7:53 AM

## 2012-12-11 NOTE — Progress Notes (Signed)
Seen and examined.  He is still foggy and amnestic about arrest.  He has a surprisingly good neurologic recovery.  Only time will tell whether he suffered any permanent damage or whether this is all reversible.

## 2012-12-12 DIAGNOSIS — F411 Generalized anxiety disorder: Secondary | ICD-10-CM

## 2012-12-12 LAB — CBC
MCH: 27.8 pg (ref 26.0–34.0)
MCV: 83.7 fL (ref 78.0–100.0)
Platelets: 273 10*3/uL (ref 150–400)
RBC: 2.88 MIL/uL — ABNORMAL LOW (ref 4.22–5.81)
RDW: 16.7 % — ABNORMAL HIGH (ref 11.5–15.5)
WBC: 8.7 10*3/uL (ref 4.0–10.5)

## 2012-12-12 LAB — CULTURE, BLOOD (ROUTINE X 2)
Culture: NO GROWTH
Culture: NO GROWTH

## 2012-12-12 LAB — GLUCOSE, CAPILLARY: Glucose-Capillary: 185 mg/dL — ABNORMAL HIGH (ref 70–99)

## 2012-12-12 LAB — BASIC METABOLIC PANEL
Calcium: 8.5 mg/dL (ref 8.4–10.5)
Creatinine, Ser: 1.02 mg/dL (ref 0.50–1.35)
GFR calc Af Amer: >90 mL/min (ref >90)
Sodium: 138 mEq/L (ref 135–145)

## 2012-12-12 NOTE — Progress Notes (Signed)
Seen and examined.  Discussed with Dr. Sheral Apley and agree with her documentation and management.  He continues to have significant amnesia, which seems to be slowly improving.  He is medically stable and OK to DC today provided he has power at his home and a home CPAP.  I would not risk another arrest and keep him in the hospital until we can guarantee he has the needed and functioning equipment at home.

## 2012-12-12 NOTE — Progress Notes (Signed)
Family Medicine Teaching Service Daily Progress Note Service Page: [phone]  Subjective: Doing well this morning. Slept well with CPAP last night. No complaints today. Continues to ask many questions about his arrest/  Objective: Temp:  [98.7 F (37.1 C)-99.1 F (37.3 C)] 98.7 F (37.1 C) (03/08 0433) Pulse Rate:  [80-88] 80 (03/08 0433) Resp:  [16-20] 20 (03/08 0433) BP: (150-163)/(73-95) 163/95 mmHg (03/08 0433) SpO2:  [90 %-97 %] 97 % (03/08 0433) Exam: General: NAD, sleeping with CPAP HEENT: AT, [place]. MMM Cardiovascular: rrr, no apparent mrg, chest tenderness midsternal Respiratory: CTAB, no wheezes or crackles Extremities: no edema Neuro: Awake, alert. Oriented to person, place and year. No focal neurologic findings.  CBC BMET   Recent Labs Lab 12/10/12 0523 12/11/12 0635 12/12/12 0535  WBC 16.5* 10.4 8.7  HGB 8.2* 8.0* 8.0*  HCT 24.9* 24.7* 24.1*  PLT 297 266 273    Recent Labs Lab 12/10/12 0523 12/11/12 0635 12/12/12 0535  NA 138 136 138  K 4.7 4.2 3.8  CL 102 98 99  CO2 28 22 25   BUN 9 6 5*  CREATININE 1.30 1.06 1.02  GLUCOSE 145* 128* 232*  CALCIUM 8.1* 8.5 8.5    BCx NGTD Resp Cx beta hemolytic strep moderate UCx neg  Imaging/Diagnostic Tests: Dg Chest Port 1 View  12/09/2012 IMPRESSION:  1.  Slight interval improvement in vascular congestion, with residual mild left basilar opacity.  This could reflect mild pneumonia or possibly mild interstitial edema. 2.  Mild cardiomegaly.   Original Report Authenticated By: Santa Lighter, M.D.    Assessment/Plan: Patient is a 47 yo male with OSA and alcohol use history who presented after PEA arrest felt to be due to OSA respiratory arrest in conjunction with alcohol use.  1. CV: s/p PEA arrest, hypothermic protocol. Doing well. Patient reported this morning that he remembers taking sleeping pills before he went to bed the night before the arrest because he had worked an extra shift and his sleep schedule was  off.  HTN: Remains elevated to [id number] systolic. Tolerating medications well -continue metoprolol 25 mg BID -will increase norvasc to 10 mg -continue to monitor -prn hydralazine -continue ASA  2. Pulm: OSA s/p likely respiratory arrest. Now on RA but wearing CPAP at night. Patient unclear about his sleep apnea diagnosis. -CPAP at night-care mgmt to help setting up home CPAP, which should be delivered today -cough suppression with chlorpheniramine, tessalon perles, chlorhexadine spray. He has a productive cough at this time.  3. Heme: history of GI bleed with anemia-recent EGD showed non-bleeding ulcer-hgb stable -CBC stable, not a current problem  4. ID: no apparent source and is afebrile at this time.  -cultures negative, no antibiotics  5. MSK: pain in chest over area of compressions -doing well with percocet, and will d/c with short term course of medication. Long term use of narcotics should be avoided given his CPAP and recent respiratory arrest  6. Endo: DM II -treat with SSI -will consider restarting lantus once eating  -follow CBGs  7. Neuro: post-anoxic brain injury -improved today with following commands and out of bed -PT/OT recs home health PT and 24 hr supervision-care mgmt c/s for PT  FEN/GI: carb modified diet, KVO PPx: SQ heparin Dispo: Anticipate discharge home today.  Code Status: full  [name], MD 12/12/2012, 1:11 PM

## 2012-12-13 DIAGNOSIS — M129 Arthropathy, unspecified: Secondary | ICD-10-CM

## 2012-12-13 LAB — CBC
HCT: 24.9 % — ABNORMAL LOW (ref 39.0–52.0)
MCH: 27.1 pg (ref 26.0–34.0)
MCHC: 32.9 g/dL (ref 30.0–36.0)
MCV: 82.2 fL (ref 78.0–100.0)
Platelets: 312 10*3/uL (ref 150–400)
RDW: 17.1 % — ABNORMAL HIGH (ref 11.5–15.5)

## 2012-12-13 LAB — BASIC METABOLIC PANEL
BUN: 4 mg/dL — ABNORMAL LOW (ref 6–23)
Calcium: 8.7 mg/dL (ref 8.4–10.5)
Chloride: 96 mEq/L (ref 96–112)
Creatinine, Ser: 1.02 mg/dL (ref 0.50–1.35)
GFR calc Af Amer: >90 mL/min (ref >90)
GFR calc non Af Amer: 85 mL/min — ABNORMAL LOW (ref >90)

## 2012-12-13 LAB — GLUCOSE, CAPILLARY
Glucose-Capillary: 277 mg/dL — ABNORMAL HIGH (ref 70–99)
Glucose-Capillary: 169 mg/dL — ABNORMAL HIGH (ref 70–99)

## 2012-12-13 NOTE — Progress Notes (Signed)
IN TO PLACE PIV, PT STATES HE DOES NOT WANT TO BE STUCK, STATES HE FEELS BETTER AND THINKS HE IS GOING HOME, EXPLAINED TO PT THAT IT  IS HOSPITAL POLICY TO HAVE A PIV WHEN YOU HAVE A HEART MONITOR, PT STATED AGAIN HE DID NOT WANT THE PIV, STAFF RN MADE AWARE

## 2012-12-13 NOTE — Progress Notes (Signed)
Family Medicine Teaching Service Daily Progress Note Service Page: [phone]  Subjective: Unable to d/c home yesterday because CPAP not delivered and patient does not have power at home. Pt reports he has a generator that he can use at home, but CPAP still has not been supplied. He states he feels well with no complaints.  Objective: Temp:  [98.6 F (37 C)] 98.6 F (37 C) (03/09 0540) Pulse Rate:  [79-88] 79 (03/09 0540) Resp:  [16-19] 18 (03/09 0540) BP: (151-178)/(80-96) 172/92 mmHg (03/09 0628) SpO2:  [96 %-98 %] 97 % (03/09 0540)  Exam: General: Up and walking around room HEENT: AT, [place]. MMM Cardiovascular: RRR Respiratory: CTAB Extremities: no edema Neuro: Grossly intact. Answers questions appropriately  CBC BMET   Recent Labs Lab 12/11/12 0635 12/12/12 0535 12/13/12 0600  WBC 10.4 8.7 8.0  HGB 8.0* 8.0* 8.2*  HCT 24.7* 24.1* 24.9*  PLT 266 273 312    Recent Labs Lab 12/11/12 0635 12/12/12 0535 12/13/12 0600  NA 136 138 138  K 4.2 3.8 3.6  CL 98 99 96  CO2 22 25 26   BUN 6 5* 4*  CREATININE 1.06 1.02 1.02  GLUCOSE 128* 232* 249*  CALCIUM 8.5 8.5 8.7    BCx NGTD Resp Cx beta hemolytic strep moderate UCx neg  Imaging/Diagnostic Tests: Dg Chest Port 1 View  12/09/2012 IMPRESSION:  1.  Slight interval improvement in vascular congestion, with residual mild left basilar opacity.  This could reflect mild pneumonia or possibly mild interstitial edema. 2.  Mild cardiomegaly.   Original Report Authenticated By: Santa Lighter, M.D.    Assessment/Plan: Patient is a 47 yo male with OSA and alcohol use history who presented after PEA arrest felt to be due to OSA respiratory arrest in conjunction with alcohol use.  1. CV: s/p PEA arrest, hypothermic protocol. Doing well. Patient reported this morning that he remembers taking sleeping pills before he went to bed the night before the arrest because he had worked an extra shift and his sleep schedule was off.  HTN: Remains  elevated. Tolerating medications well -continue metoprolol 25 mg BID and norvasc to 10 mg -continue to monitor -prn hydralazine -continue ASA   2. Pulm: OSA s/p likely respiratory arrest. Now on RA but wearing CPAP at night. Patient unclear about his sleep apnea diagnosis. -CPAP at night-care mgmt to help setting up home CPAP which still has not been set up. RN has contacted agency and will let us know if this is available today. -cough suppression with chlorpheniramine, tessalon perles, chlorhexadine spray. He has a productive cough at this time.  3. Heme: history of GI bleed with anemia-recent EGD showed non-bleeding ulcer-hgb stable -CBC stable, not a current problem  4. ID: no apparent source and is afebrile at this time.  -cultures negative, no antibiotics  5. MSK: pain in chest over area of compressions -doing well with percocet, and will d/c with short term course of medication. Long term use of narcotics should be avoided given his CPAP and recent respiratory arrest  6. Endo: DM II -treat with SSI -follow CBGs  7. Neuro: post-anoxic brain injury -PT/OT recs home health PT and 24 hr supervision-care mgmt c/s for PT. This has been ordered  FEN/GI: carb modified diet, KVO PPx: SQ heparin Dispo: Anticipate discharge in next 24-48 hours once power is restored at his residence and he has CPAP given his high risk of bad outcomes  Code Status: full  HAIRFORD, AMBER, MD 12/13/2012, 8:06 AM

## 2012-12-13 NOTE — Progress Notes (Signed)
Spoke with Case Management RN concerning pt's CPAP. States she will have to called [facility]. Awaiting call back from Case Management.

## 2012-12-13 NOTE — Progress Notes (Signed)
Notes for CPAP reviewed. NCM followed up with [employer] and they will notify NCM on 3/10 about CPAP. [name] RN CCM Case Mgmt phone [phone]

## 2012-12-13 NOTE — Progress Notes (Signed)
Pt's wife is up visiting and states they still do not have any power at their residence. Pt's wife , Sunday Spillers, wishes to be called prior to discharge at [phone] to make sure they have power. Will continue to monitor pt

## 2012-12-13 NOTE — Progress Notes (Signed)
Seen and examined.  As per Dr. Sheral Apley.  Memory improving.  Medically stable.  OK to dc when he has power and home CPAP.

## 2012-12-14 ENCOUNTER — Other Ambulatory Visit: Payer: Self-pay | Admitting: Pulmonary Disease

## 2012-12-14 DIAGNOSIS — G4733 Obstructive sleep apnea (adult) (pediatric): Secondary | ICD-10-CM

## 2012-12-14 LAB — CBC
HCT: 26.5 % — ABNORMAL LOW (ref 39.0–52.0)
RBC: 3.21 MIL/uL — ABNORMAL LOW (ref 4.22–5.81)
RDW: 17.2 % — ABNORMAL HIGH (ref 11.5–15.5)
WBC: 10.4 10*3/uL (ref 4.0–10.5)

## 2012-12-14 LAB — BASIC METABOLIC PANEL
BUN: 8 mg/dL (ref 6–23)
CO2: 24 mEq/L (ref 19–32)
Chloride: 96 mEq/L (ref 96–112)
GFR calc Af Amer: >90 mL/min (ref >90)
Potassium: 3.4 mEq/L — ABNORMAL LOW (ref 3.5–5.1)

## 2012-12-14 LAB — CULTURE, BLOOD (ROUTINE X 2): Culture: NO GROWTH

## 2012-12-14 NOTE — Progress Notes (Signed)
FMTS Attending Note Patient seen and examined by me, discussed with resident team and I agree with Dr Ellen Henri assessment and plan for today.  Specifically, patient appears stable for discharge.  He reports that his usual alcohol use is around beer per day.  He works as a Furniture conservator/restorer.  Would d/c sedating meds from med list (trazodone), and encourage to avoid alcohol.  For reassessment in outpatient later this week before returning to work.  Dalbert Mayotte, MD

## 2012-12-14 NOTE — Progress Notes (Signed)
Spoke with PT in hallway who was walking with pt.  Pt is now fully I with all adls and mobility.  Will d/c OT at this time. Jinger Neighbors, [place] [phone]

## 2012-12-14 NOTE — Progress Notes (Signed)
Pt places self on and off [employer]. Settings are Auto 6-20. With 2 lpm bled in. Pt vitals are HR 89 RR 18 Oxygen saturation was 100%. RT will continue to monitor

## 2012-12-14 NOTE — Progress Notes (Signed)
Physical Therapy Treatment Patient Details Name: Jeffrey [name] MRN: [id number] DOB: 1966/06/04 Today's Date: 12/14/2012 Time: 0900-0920 PT Time Calculation (min): 20 min  PT Assessment / Plan / Recommendation Comments on Treatment Session  Pt did well with all gait tasks and static/dynamic standing balance.  MD in during rx, and plan is for pt to go home.    Follow Up Recommendations  No PT follow up     Does the patient have the potential to tolerate intense rehabilitation     Barriers to Discharge        Equipment Recommendations  None recommended by PT    Recommendations for Other Services    Frequency     Plan Discharge plan remains appropriate    Precautions / Restrictions Precautions Precautions: Fall Restrictions Weight Bearing Restrictions: No   Pertinent Vitals/Pain Some c/o chest pain from CPR being performed.    Mobility  Bed Mobility Sit to Supine: 7: Independent Transfers Transfers: Sit to Stand;Stand to Sit Sit to Stand: 7: Independent Stand to Sit: 7: Independent Ambulation/Gait Ambulation/Gait Assistance: 7: Independent Ambulation Distance (Feet): 400 Feet Assistive device: None Ambulation/Gait Assistance Details: worked on starts/stops, turning head Gait Pattern: Step-through pattern;Wide base of support;Within Functional Limits Gait velocity: WFL General Gait Details: Pt with no LOB and able to negotiate obstacles in hallway.    Exercises     PT Diagnosis:    PT Problem List:   PT Treatment Interventions:     PT Goals Acute Rehab PT Goals Pt will go Supine/Side to Sit: Independently PT Goal: Supine/Side to Sit - Progress: Met Pt will go Sit to Supine/Side: Independently PT Goal: Sit to Supine/Side - Progress: Met Pt will go Sit to Stand: Independently PT Goal: Sit to Stand - Progress: Met Pt will go Stand to Sit: Independently PT Goal: Stand to Sit - Progress: Met Pt will Transfer Bed to Chair/Chair to Bed: Independently PT  Transfer Goal: Bed to Chair/Chair to Bed - Progress: Met Pt will Ambulate: >150 feet;Independently PT Goal: Ambulate - Progress: Met  Visit Information  Last PT Received On: 12/14/12 Assistance Needed: +1    Subjective Data  Subjective: "Where have you been?  I've been walking all weekend.  My memory is coming back now." Patient Stated Goal: go home   Cognition  Cognition Overall Cognitive Status: Appears within functional limits for tasks assessed/performed Arousal/Alertness: Awake/alert Orientation Level: Appears intact for tasks assessed Behavior During Session: [facility] for tasks performed Safety/Judgement: Impulsive    Balance  High Level Balance High Level Balance Activites: Direction changes;Turns;Sudden stops;Head turns;Side stepping High Level Balance Comments: no LOB  End of Session PT - End of Session Activity Tolerance: Patient tolerated treatment well Patient left: with call bell/phone within reach;in bed   GP     SMITH,KAREN LUBECK 12/14/2012, 10:14 AM

## 2012-12-14 NOTE — Progress Notes (Signed)
Pt discharge instructions and patient education completed. Pt had no IV access. Prescription given to patient. CPAP to be delivered to patient's home between 5pm-6pm per case manager. Pt and wife aware. D/C home. Marko Plume

## 2012-12-14 NOTE — Progress Notes (Signed)
Family Medicine Teaching Service Daily Progress Note Service Page: [phone]  Subjective: Doing well this morning. Memory slowly coming back. Got power back.  Objective: Temp:  [98.5 F (36.9 C)-99.6 F (37.6 C)] 98.5 F (36.9 C) (03/10 0603) Pulse Rate:  [80-91] 91 (03/10 0603) Resp:  [20-21] 21 (03/10 0603) BP: (134-157)/(74-112) 144/74 mmHg (03/10 0603) SpO2:  [94 %-100 %] 94 % (03/10 0603)  Exam: General: NAD, laying in bed HEENT: AT, [place]. MMM Cardiovascular: RRR Respiratory: CTAB Extremities: no edema Neuro: Grossly intact. Answers questions appropriately.  CBC BMET   Recent Labs Lab 12/12/12 0535 12/13/12 0600 12/14/12 0749  WBC 8.7 8.0 10.4  HGB 8.0* 8.2* 8.7*  HCT 24.1* 24.9* 26.5*  PLT 273 312 388    Recent Labs Lab 12/11/12 0635 12/12/12 0535 12/13/12 0600  NA 136 138 138  K 4.2 3.8 3.6  CL 98 99 96  CO2 22 25 26   BUN 6 5* 4*  CREATININE 1.06 1.02 1.02  GLUCOSE 128* 232* 249*  CALCIUM 8.5 8.5 8.7    BCx NGTD Resp Cx beta hemolytic strep moderate UCx neg  Imaging/Diagnostic Tests: Dg Chest Port 1 View  12/09/2012 IMPRESSION:  1.  Slight interval improvement in vascular congestion, with residual mild left basilar opacity.  This could reflect mild pneumonia or possibly mild interstitial edema. 2.  Mild cardiomegaly.   Original Report Authenticated By: Santa Lighter, M.D.    Assessment/Plan: Patient is a 47 yo male with OSA and alcohol use history who presented after PEA arrest felt to be due to OSA respiratory arrest in conjunction with alcohol use.  1. CV: s/p PEA arrest, hypothermic protocol. Doing well. Patient reported that he remembers taking sleeping pills before he went to bed the night before the arrest because he had worked an extra shift and his sleep schedule was off.  HTN: Remains elevated. Tolerating medications well -continue metoprolol 25 mg BID and norvasc to 10 mg -continue to monitor -prn hydralazine -continue ASA   2. Pulm:  OSA s/p likely respiratory arrest. Now on RA but wearing CPAP at night. Patient unclear about his sleep apnea diagnosis. -CPAP at night-care mgmt to help setting up home CPAP which still has not been set up. RN has contacted agency and will let us know if this is available today. -cough suppression with chlorpheniramine, tessalon perles, chlorhexadine spray. He has a productive cough at this time.  3. Heme: history of GI bleed with anemia-recent EGD showed non-bleeding ulcer-hgb stable -CBC stable, not a current problem  4. ID: no apparent source and is afebrile at this time.  -cultures negative, no antibiotics  5. MSK: pain in chest over area of compressions -doing well with percocet, and will d/c with short term course of medication. Long term use of narcotics should be avoided given his CPAP and recent respiratory arrest  6. Endo: DM II -treat with SSI -follow CBGs  7. Neuro: post-anoxic brain injury -PT/OT recs home health PT and 24 hr supervision-care mgmt c/s for PT. This has been ordered.  FEN/GI: carb modified diet, KVO PPx: SQ heparin Dispo: D/c today as long as CPAP is arranged Code Status: full  Tommi Rumps, MD 12/14/2012, 9:24 AM

## 2012-12-15 LAB — GLUCOSE, CAPILLARY: Glucose-Capillary: 272 mg/dL — ABNORMAL HIGH (ref 70–99)

## 2012-12-15 NOTE — Discharge Summary (Signed)
FMTS Attending Note Patient seen and examined byme on day of discharge, see Progress Note for 3/10 for more details. I agree with plan for discharge to home.  Dalbert Mayotte, MD

## 2012-12-23 ENCOUNTER — Ambulatory Visit (INDEPENDENT_AMBULATORY_CARE_PROVIDER_SITE_OTHER): Payer: Managed Care, Other (non HMO) | Admitting: Sports Medicine

## 2012-12-23 ENCOUNTER — Encounter: Payer: Self-pay | Admitting: Sports Medicine

## 2012-12-23 VITALS — BP 126/88 | HR 121 | Temp 97.7°F | Ht 70.0 in | Wt 265.8 lb

## 2012-12-23 DIAGNOSIS — F1021 Alcohol dependence, in remission: Secondary | ICD-10-CM

## 2012-12-23 DIAGNOSIS — E119 Type 2 diabetes mellitus without complications: Secondary | ICD-10-CM

## 2012-12-23 DIAGNOSIS — I10 Essential (primary) hypertension: Secondary | ICD-10-CM

## 2012-12-23 NOTE — Assessment & Plan Note (Signed)
Not significantly improve.  Will likely take time and unlikely to have full recovery Will refer to Neurology for NeuroPsych eval Wants to return to work but unclear Check EtOH as possible cause

## 2012-12-23 NOTE — Patient Instructions (Addendum)
It was good to see you.  We are checking labs today & Referring you to Neurology and Cardiology for evaluation Please make your pulmonology follow up appointment  Please follow up with me in 1-2 weeks to further discuss your lab results

## 2012-12-24 LAB — BASIC METABOLIC PANEL
BUN: 7 mg/dL (ref 6–23)
CO2: 20 mEq/L (ref 19–32)
Chloride: 96 mEq/L (ref 96–112)
Creat: 1.16 mg/dL (ref 0.50–1.35)

## 2012-12-28 DIAGNOSIS — Z9989 Dependence on other enabling machines and devices: Secondary | ICD-10-CM | POA: Insufficient documentation

## 2012-12-28 NOTE — Assessment & Plan Note (Signed)
Avoid any sedating Medications

## 2012-12-28 NOTE — Assessment & Plan Note (Signed)
Controlled today 

## 2012-12-28 NOTE — Assessment & Plan Note (Addendum)
At risk for recurrent events given continued EtOH abuse. Will have follow up with [facility] given PEA arrest. No current cardiac symptoms. Needs weight loss, continued BP control, Lipid control. Glucose control and underlying EtOH improvement

## 2012-12-28 NOTE — Assessment & Plan Note (Signed)
Very poorly controlled Will be difficult given extensive EtOH use

## 2012-12-28 NOTE — Progress Notes (Signed)
  [place]  Patient name: Jeffrey [name] MRN [id number]  Date of birth: 08-28-1966  CC & HPI:  Jeffrey [name] is a 47 y.o. male presenting today for follow up from a recent hospitalization:  he was admitted for: PEA arrest 2o/2 OSA and EtOH abuse.  Underwent Hypothermia protocol and regained motor and majority of cognitive function Symptoms have improved, using CPAP.  Still drinking EtOH.  Wife is present and concerned regarding extent of drinking.  Seems to be drinking more since event  Follow up issues include: # OSA: Using CPAP, has appointment with Pulmonology later this month.  # EtOH abuse:  Using more since event.  Wife reports he is acutely intoxicated today.  He doesn't remember anybody telling him his EtOH use was associated with his arrest  # PEA Arrest:  No chest pain, no palpitations, no DOE.  No orthopnea.  # Cognitive Recovery:  Wants to return to work.  Unable to function well at home.   Wife reports poor recall  ------------------------------------------------------------------------------------------------------------------ Medication Compliance: probably noncompliant though I cannot elicit that specific history  Adverse effects from new medications: none  ROS:  PER HPI  Pertinent History Reviewed:  Medical & Surgical Hx:  Reviewed: Significant for PEA arrest due to Sleep apnea and Heavy EtOH abuse Medications: Reviewed & Updated - see associated section Social History: Reviewed -  reports that he has never smoked. He does not have any smokeless tobacco history on file.   Objective Findings:  Vitals:  Filed Vitals:   12/23/12 1332  BP: 126/88  Pulse: 121  Temp: 97.7 F (36.5 C)  TempSrc: Oral  Height: 5\' 10"  (1.778 m)  Weight: 265 lb 12.8 oz (120.566 kg)    PE: GENERAL:  Adult Obese AA  male. In no discomfort; no respiratory distress.  Appears acutely intoxicated.  Perseverates on events in hospital.  Admits to  drinking PSYCH: Alert and appropriately interactive; Insight:Shallow.  Poor recall   H&N: AT/[place], trachea midline EENT:  MMM, no scleral icterus, EOMi HEART: RRR, S1/S2 heard, no murmur LUNGS: CTA B, no wheezes, no crackles EXTREMITIES: Moves all 4 extremities spontaneously, warm well perfused, no edema, bilateral DP and PT pulses 2/4.   NEUROPSYCH:  Poor recall.  + [place],  Strength 5+/5.  Unsteady ambulation.  + FNF.   Assessment & Plan:

## 2012-12-28 NOTE — Assessment & Plan Note (Signed)
Has follow up appointment with PULM

## 2012-12-28 NOTE — Assessment & Plan Note (Signed)
Acutely decompensated.  Appears Intoxicated today on exam and admits to some use but vague with questioning amount. Declines inpatient rehab Needs close follow up Extensive discussion with him and his wife regarding increased risk of recurrent PEA event without use of CPAP and EtOH cessation.   Wife and pt voice understanding. Pt somewhat tearful

## 2012-12-29 ENCOUNTER — Ambulatory Visit (HOSPITAL_BASED_OUTPATIENT_CLINIC_OR_DEPARTMENT_OTHER): Payer: Managed Care, Other (non HMO) | Attending: Pulmonary Disease | Admitting: Radiology

## 2012-12-29 ENCOUNTER — Telehealth: Payer: Self-pay | Admitting: Sports Medicine

## 2012-12-29 VITALS — Ht 70.0 in | Wt 260.0 lb

## 2012-12-29 DIAGNOSIS — G4733 Obstructive sleep apnea (adult) (pediatric): Secondary | ICD-10-CM | POA: Insufficient documentation

## 2012-12-29 NOTE — Telephone Encounter (Signed)
Pt needs work disability forms filled out.

## 2012-12-29 NOTE — Telephone Encounter (Signed)
pls call at [phone] when ready

## 2012-12-29 NOTE — Telephone Encounter (Signed)
FMLA forms placed in Dr. Nicolasa Ducking box for completion.  Jeffrey [name]

## 2012-12-30 ENCOUNTER — Telehealth: Payer: Self-pay | Admitting: *Deleted

## 2012-12-30 NOTE — Telephone Encounter (Signed)
It is neuroPSYCH eval Thanks!

## 2012-12-30 NOTE — Telephone Encounter (Signed)
Completed and returned to [facility]

## 2012-12-30 NOTE — Telephone Encounter (Signed)
Question for you [facility] contacted [name] back and wanted to know if the referral is for neurology and not neuropsych evaluation. I thought is is for neurology, but just wanted to double check

## 2012-12-30 NOTE — Telephone Encounter (Signed)
Mr. Guardado notified FMLA forms are completed and ready to be picked up at front desk. Lauralyn Primes

## 2013-01-01 ENCOUNTER — Institutional Professional Consult (permissible substitution): Payer: BC Managed Care – PPO | Admitting: Pulmonary Disease

## 2013-01-11 DIAGNOSIS — G4710 Hypersomnia, unspecified: Secondary | ICD-10-CM

## 2013-01-11 DIAGNOSIS — G4730 Sleep apnea, unspecified: Secondary | ICD-10-CM

## 2013-01-11 NOTE — Procedures (Signed)
Jeffrey [name], Jeffrey [name]              ACCOUNT NO.:  [id number]  MEDICAL RECORD NO.:  [id number]          PATIENT TYPE:  OUT  LOCATION:  SLEEP CENTER                 FACILITY[facility]  [facility]  PHYSICIAN:  Kathee Delton, MD,FCCPDATE OF BIRTH:  08/29/64  DATE OF STUDY:  12/29/2012                           NOCTURNAL POLYSOMNOGRAM  REFERRING PHYSICIAN:  Noe Gens, NP  INDICATION FOR STUDY:  Hypersomnia with sleep apnea.  EPWORTH SLEEPINESS SCORE:  5.  MEDICATIONS:  SLEEP ARCHITECTURE:  The patient had a total sleep time of 361 minutes with no slow-wave sleep and decreased quantity of REM.  Sleep onset latency was normal at 2 minutes and REM onset was prolonged.  Sleep efficiency was 77% during the diagnostic portion of the study, and 85% during the titration portion.  RESPIRATORY DATA:  The patient underwent a split night protocol where he was found to have 38 obstructive events in the first 137 minutes of sleep.  This gave him an apnea-hypopnea index of 17 events per hour during the diagnostic portion of the study.  The events occur primarily in the supine position, and there was moderate snoring noted throughout. By protocol, the patient was then fitted with a medium ResMed Quattro FX full-face mask and CPAP titration was initiated.  The pressure was increased as high as 13 cm of water, however, it is unclear whether this is an optimal pressure for him.  OXYGEN DATA:  There was O2 desaturation as low as 80% with the patient's obstructive events.  CARDIAC DATA:  Rare PVC noted, but no clinically significant arrhythmias were seen.  MOVEMENT-PARASOMNIA:  There were no significant leg jerks or other abnormal behaviors noted.  IMPRESSIONS-RECOMMENDATIONS: 1. Split night study reveals mild obstructive sleep apnea/hypopnea     syndrome, with an AHI of 17 events per hour and oxygen desaturation     transiently as low as 80% during the diagnostic portion of the     study.  The patient  was then fitted with a medium ResMed Quattro FX     full face mask, and titrated as high as 13 cm of water.  I do not     think the titration is satisfactory, and would therefore recommend     an auto titration at home.  Clinical     correlation is suggested.  The patient should also be encouraged to     work aggressively on weight loss 2. Rare PVC noted, but no clinically significant arrhythmias were     seen.     Kathee Delton, MD,FCCP Diplomate, [place] Board of Sleep Medicine    [facility]  D:  01/11/2013 07:30:34  T:  01/11/2013 07:46:49  Job:  [id number]

## 2013-01-20 ENCOUNTER — Telehealth: Payer: Self-pay | Admitting: Sports Medicine

## 2013-01-20 NOTE — Telephone Encounter (Signed)
Patient's wife called back.  States recent CBG was 264 & 315.  Patient is "falling all over the place and can't stand up."  Wife is unable to bring him in.  Advised wife to call EMS to come evaluate patient.  Wife agreeable.  Nolene Ebbs, RN

## 2013-01-20 NOTE — Telephone Encounter (Signed)
I don't know if this caregiver was the patients wife or mother.  She called to schedule the patient an appointment because his blood sugars have been really off.  She said that he passed out last night and she call the paramedics who gave him an IV and asked her to give him Orange Juice and Peanut Butter.  The patient came around and refused to go to the hospital.  She didn't feel like he would would be strong enough to come to the office today or tomorrow.  She went to ask him if he felt like he wanted to come in today or tomorrow and she said that his speech was very slurred. I attempted to transfer her through to Triage but there was no one in the office so I schedule him for Monday but I told her that if he didn't get any better soon, she needed to take him to the hospital.  But, I am sending this to Triage because I think Triage needs to call and check in on this patient as well.

## 2013-01-20 NOTE — Telephone Encounter (Signed)
Returned call to patient's wife.  Patient CBG last night was 29 and he was hard to arouse.  Wife called paramedics and "they gave him some medication in his IV."  This morning at 9:06 am--CBG 299.  Patient has not eaten or taken insulin yet.  Wife is fixing him something to eat now.  Patient "does not have an appetite, has lost weight, and does not want to come to the doctor because he's afraid to be sent to the hospital."  Patient has appt with Dr. Paulla Fore on 01/25/13.  Encouraged wife to have patient keep appt.  Discussed hypoglycemic signs and encouraged wife to call EMS if patient has any more episodes.  Nolene Ebbs, RN

## 2013-01-23 ENCOUNTER — Telehealth: Payer: Self-pay | Admitting: Family Medicine

## 2013-01-23 NOTE — Telephone Encounter (Signed)
EMERGENCY LINE:  Spoke with wife, Jeffrey [name], who called because Jeffrey [name] is having low blood sugar. D/c March 10 from hospital after a respiratory arrest. He has lost a lot of weight, per his wife. She states his blood sugar now 48. He is awake and answering questions, but she states he seems "slower" to her. I have encouraged him to give him some juice and/or fruit asap and recheck his blood sugar. She told me she called EMS last week for a similar episode which resolved with juice. I told Jeffrey [name] that if his blood sugar does not go up or stay up she needed to call EMS and have him come to the hospital.   She states his number is up and down. Sometimes it is up to 300's and sometimes low like today. I am not sure if he is getting too much insulin causing his problem. I have told Jeffrey [name] to decrease his next does of insulin to 40 units (rather than his usual 70) and make an appointment to see Dr. Paulla Fore next week to discuss this.  She agrees with plan and is appreciative of advice.  Amber M. Hairford, M.D. 01/23/2013 4:46 PM

## 2013-01-24 ENCOUNTER — Inpatient Hospital Stay (HOSPITAL_COMMUNITY)
Admission: EM | Admit: 2013-01-24 | Discharge: 2013-01-27 | DRG: 638 | Disposition: A | Discharge status: Home / Self Care | Payer: Managed Care, Other (non HMO) | Attending: Family Medicine | Admitting: Family Medicine

## 2013-01-24 ENCOUNTER — Emergency Department (HOSPITAL_COMMUNITY): Payer: Managed Care, Other (non HMO)

## 2013-01-24 ENCOUNTER — Encounter (HOSPITAL_COMMUNITY): Payer: Self-pay

## 2013-01-24 DIAGNOSIS — I10 Essential (primary) hypertension: Secondary | ICD-10-CM | POA: Diagnosis present

## 2013-01-24 DIAGNOSIS — E162 Hypoglycemia, unspecified: Secondary | ICD-10-CM | POA: Diagnosis present

## 2013-01-24 DIAGNOSIS — E161 Other hypoglycemia: Secondary | ICD-10-CM

## 2013-01-24 DIAGNOSIS — Z7982 Long term (current) use of aspirin: Secondary | ICD-10-CM

## 2013-01-24 DIAGNOSIS — E119 Type 2 diabetes mellitus without complications: Secondary | ICD-10-CM

## 2013-01-24 DIAGNOSIS — Z794 Long term (current) use of insulin: Secondary | ICD-10-CM

## 2013-01-24 DIAGNOSIS — E7800 Pure hypercholesterolemia, unspecified: Secondary | ICD-10-CM

## 2013-01-24 DIAGNOSIS — Z6838 Body mass index (BMI) 38.0-38.9, adult: Secondary | ICD-10-CM

## 2013-01-24 DIAGNOSIS — E662 Morbid (severe) obesity with alveolar hypoventilation: Secondary | ICD-10-CM | POA: Diagnosis present

## 2013-01-24 DIAGNOSIS — F411 Generalized anxiety disorder: Secondary | ICD-10-CM | POA: Diagnosis present

## 2013-01-24 DIAGNOSIS — G4733 Obstructive sleep apnea (adult) (pediatric): Secondary | ICD-10-CM | POA: Diagnosis present

## 2013-01-24 DIAGNOSIS — E669 Obesity, unspecified: Secondary | ICD-10-CM | POA: Diagnosis present

## 2013-01-24 DIAGNOSIS — F1021 Alcohol dependence, in remission: Secondary | ICD-10-CM

## 2013-01-24 DIAGNOSIS — E1140 Type 2 diabetes mellitus with diabetic neuropathy, unspecified: Secondary | ICD-10-CM | POA: Diagnosis present

## 2013-01-24 DIAGNOSIS — E118 Type 2 diabetes mellitus with unspecified complications: Secondary | ICD-10-CM

## 2013-01-24 DIAGNOSIS — M129 Arthropathy, unspecified: Secondary | ICD-10-CM | POA: Diagnosis present

## 2013-01-24 DIAGNOSIS — Z79899 Other long term (current) drug therapy: Secondary | ICD-10-CM

## 2013-01-24 DIAGNOSIS — Z9989 Dependence on other enabling machines and devices: Secondary | ICD-10-CM

## 2013-01-24 DIAGNOSIS — F1010 Alcohol abuse, uncomplicated: Secondary | ICD-10-CM | POA: Diagnosis present

## 2013-01-24 DIAGNOSIS — E11649 Type 2 diabetes mellitus with hypoglycemia without coma: Secondary | ICD-10-CM | POA: Diagnosis present

## 2013-01-24 DIAGNOSIS — E1169 Type 2 diabetes mellitus with other specified complication: Principal | ICD-10-CM | POA: Diagnosis present

## 2013-01-24 DIAGNOSIS — R4182 Altered mental status, unspecified: Secondary | ICD-10-CM

## 2013-01-24 LAB — URINE MICROSCOPIC-ADD ON

## 2013-01-24 LAB — POCT I-STAT, CHEM 8
BUN: 12 mg/dL (ref 6–23)
Chloride: 99 mEq/L (ref 96–112)
Creatinine, Ser: 1.4 mg/dL — ABNORMAL HIGH (ref 0.50–1.35)
Glucose, Bld: 121 mg/dL — ABNORMAL HIGH (ref 70–99)
HCT: 37 % — ABNORMAL LOW (ref 39.0–52.0)
Potassium: 4.6 mEq/L (ref 3.5–5.1)

## 2013-01-24 LAB — URINALYSIS, ROUTINE W REFLEX MICROSCOPIC
Bilirubin Urine: NEGATIVE
Glucose, UA: 250 mg/dL — AB
Hgb urine dipstick: NEGATIVE
Specific Gravity, Urine: 1.01 (ref 1.005–1.030)
pH: 6.5 (ref 5.0–8.0)

## 2013-01-24 LAB — CBC
Hemoglobin: 10.2 g/dL — ABNORMAL LOW (ref 13.0–17.0)
MCH: 26 pg (ref 26.0–34.0)
MCH: 25.3 pg — ABNORMAL LOW (ref 26.0–34.0)
MCV: 79.6 fL (ref 78.0–100.0)
MCV: 78.6 fL (ref 78.0–100.0)
Platelets: 265 10*3/uL (ref 150–400)
RBC: 3.93 MIL/uL — ABNORMAL LOW (ref 4.22–5.81)
RDW: 21.8 % — ABNORMAL HIGH (ref 11.5–15.5)
WBC: 7.4 10*3/uL (ref 4.0–10.5)

## 2013-01-24 LAB — GLUCOSE, CAPILLARY
Glucose-Capillary: 106 mg/dL — ABNORMAL HIGH (ref 70–99)
Glucose-Capillary: 107 mg/dL — ABNORMAL HIGH (ref 70–99)
Glucose-Capillary: 115 mg/dL — ABNORMAL HIGH (ref 70–99)
Glucose-Capillary: 28 mg/dL — CL (ref 70–99)
Glucose-Capillary: 49 mg/dL — ABNORMAL LOW (ref 70–99)
Glucose-Capillary: 70 mg/dL (ref 70–99)
Glucose-Capillary: 80 mg/dL (ref 70–99)
Glucose-Capillary: 93 mg/dL (ref 70–99)

## 2013-01-24 LAB — HEPATIC FUNCTION PANEL
ALT: 28 U/L (ref 0–53)
AST: 38 U/L — ABNORMAL HIGH (ref 0–37)
Albumin: 3.2 g/dL — ABNORMAL LOW (ref 3.5–5.2)
Total Protein: 8.3 g/dL (ref 6.0–8.3)

## 2013-01-24 LAB — RAPID URINE DRUG SCREEN, HOSP PERFORMED
Amphetamines: NOT DETECTED
Barbiturates: NOT DETECTED
Benzodiazepines: NOT DETECTED
Tetrahydrocannabinol: NOT DETECTED

## 2013-01-24 LAB — CREATININE, SERUM: GFR calc Af Amer: >90 mL/min (ref >90)

## 2013-01-24 MED ORDER — ENOXAPARIN SODIUM 40 MG/0.4ML ~~LOC~~ SOLN
40.0000 mg | SUBCUTANEOUS | Status: DC
  Administered 2013-01-24: 40 mg via SUBCUTANEOUS
  Filled 2013-01-24 (×2): qty 0.4

## 2013-01-24 MED ORDER — DEXTROSE 50 % IV SOLN
INTRAVENOUS | Status: AC
  Administered 2013-01-24: 50 mL via INTRAVENOUS
  Filled 2013-01-24: qty 50

## 2013-01-24 MED ORDER — DEXTROSE 50 % IV SOLN
1.0000 | Freq: Once | INTRAVENOUS | Status: AC
  Administered 2013-01-24: 25 mL via INTRAVENOUS
  Filled 2013-01-24: qty 50

## 2013-01-24 MED ORDER — LORAZEPAM 0.5 MG PO TABS: 1.0000 mg | ORAL_TABLET | Freq: Four times a day (QID) | ORAL | Status: AC | PRN

## 2013-01-24 MED ORDER — FOLIC ACID 1 MG PO TABS
1.0000 mg | ORAL_TABLET | Freq: Every day | ORAL | Status: DC
  Administered 2013-01-24 – 2013-01-27 (×4): 1 mg via ORAL
  Filled 2013-01-24 (×4): qty 1

## 2013-01-24 MED ORDER — THIAMINE HCL 100 MG/ML IJ SOLN
100.0000 mg | Freq: Every day | INTRAMUSCULAR | Status: DC
  Filled 2013-01-24: qty 1

## 2013-01-24 MED ORDER — PANTOPRAZOLE SODIUM 40 MG PO TBEC
40.0000 mg | DELAYED_RELEASE_TABLET | Freq: Every day | ORAL | Status: DC
  Administered 2013-01-24 – 2013-01-27 (×4): 40 mg via ORAL
  Filled 2013-01-24 (×4): qty 1

## 2013-01-24 MED ORDER — LORAZEPAM 2 MG/ML IJ SOLN: 1.0000 mg | Freq: Four times a day (QID) | INTRAMUSCULAR | Status: AC | PRN

## 2013-01-24 MED ORDER — OMEPRAZOLE MAGNESIUM 20 MG PO TBEC
20.0000 mg | DELAYED_RELEASE_TABLET | Freq: Every day | ORAL | Status: DC
Start: 2013-01-24 — End: 2013-01-24

## 2013-01-24 MED ORDER — VITAMIN B-1 100 MG PO TABS
100.0000 mg | ORAL_TABLET | Freq: Every day | ORAL | Status: DC
  Administered 2013-01-24 – 2013-01-27 (×4): 100 mg via ORAL
  Filled 2013-01-24 (×4): qty 1

## 2013-01-24 MED ORDER — SIMVASTATIN 20 MG PO TABS
20.0000 mg | ORAL_TABLET | Freq: Every day | ORAL | Status: DC
  Administered 2013-01-24 – 2013-01-27 (×4): 20 mg via ORAL
  Filled 2013-01-24 (×4): qty 1

## 2013-01-24 MED ORDER — METOPROLOL SUCCINATE ER 100 MG PO TB24
100.0000 mg | ORAL_TABLET | Freq: Every day | ORAL | Status: DC
  Administered 2013-01-24 – 2013-01-27 (×4): 100 mg via ORAL
  Filled 2013-01-24 (×4): qty 1

## 2013-01-24 MED ORDER — ADULT MULTIVITAMIN W/MINERALS CH
1.0000 | ORAL_TABLET | Freq: Every day | ORAL | Status: DC
  Administered 2013-01-24 – 2013-01-27 (×4): 1 via ORAL
  Filled 2013-01-24 (×4): qty 1

## 2013-01-24 MED ORDER — LOSARTAN POTASSIUM 50 MG PO TABS
50.0000 mg | ORAL_TABLET | Freq: Every day | ORAL | Status: DC
  Administered 2013-01-24 – 2013-01-27 (×4): 50 mg via ORAL
  Filled 2013-01-24 (×4): qty 1

## 2013-01-24 MED ORDER — GLUCAGON HCL (RDNA) 1 MG IJ SOLR
1.0000 mg | Freq: Once | INTRAMUSCULAR | Status: DC
  Filled 2013-01-24 (×2): qty 1

## 2013-01-24 MED ORDER — DEXTROSE-NACL 5-0.45 % IV SOLN
INTRAVENOUS | Status: DC
  Administered 2013-01-24: 23:00:00 via INTRAVENOUS
  Filled 2013-01-24 (×3): qty 1000

## 2013-01-24 MED ORDER — ASPIRIN EC 81 MG PO TBEC
81.0000 mg | DELAYED_RELEASE_TABLET | Freq: Every day | ORAL | Status: DC
  Administered 2013-01-24 – 2013-01-27 (×4): 81 mg via ORAL
  Filled 2013-01-24 (×4): qty 1

## 2013-01-24 MED ORDER — AMLODIPINE BESYLATE 5 MG PO TABS
5.0000 mg | ORAL_TABLET | Freq: Every day | ORAL | Status: DC
  Administered 2013-01-24 – 2013-01-25 (×2): 5 mg via ORAL
  Filled 2013-01-24 (×5): qty 1

## 2013-01-24 NOTE — Progress Notes (Signed)
Placed patient on CPAP via auto-mode. Maximum pressure set at 20cm and minimum pressure set at 4cm. Sp02=97%

## 2013-01-24 NOTE — ED Provider Notes (Signed)
History     CSN: [id number]  Arrival date & time 01/24/13  0307   First MD Initiated Contact with Patient 01/24/13 0326      Chief Complaint  Patient presents with  . Hypoglycemia    (Consider location/radiation/quality/duration/timing/severity/associated sxs/prior treatment) HPI  Patient is a pleasant middle aged man with insulin dependent DM who is BIB EMS with hypoglycemia. Wife says patient was unresponsive in bed. She checked his BG and it was in 40s but, then fell to 30s. She was unable to arouse patient and called EMS. The patient had BG in 30s at scene and was treated with D50. MS improved to normal.   Patient was noted to have slurred speech on my exam. This was an interval change from his initial presentation to ED, per his nurse. BG 70 mg/dL. Patient tx with 1/2 D50 amp and slurred speech resolved. Wife later stated that patient routinely has slurred speech with BG drops.  Wife says that this is the 3rd time that she has had to call EMS in the past week because of hypoglycemia. INsulin regimen of 70 units NPH qhs and SSI has not changed. Patient says that he thinks his appetite and po intake have been less than usual for the past 6 weeks or so. Wife says that she thinks he has lost weight. Patient was last seen by his PCP just a few days ago. No med changes.   No fever, vomiting, diarrhea, cp, abd pain, gu sx.   Past Medical History  Diagnosis Date  . Hypertension   . Blood transfusion   . Diabetes mellitus   . Venous insufficiency 12/13/2011  . Cardiac arrest 12/07/2012  . OSA (obstructive sleep apnea) 12/07/2012  . Anxiety   . Acute renal insufficiency 12/08/2012    Past Surgical History  Procedure Laterality Date  . Knee arthroscopy    . Esophagogastroduodenoscopy Left 11/26/2012    Procedure: ESOPHAGOGASTRODUODENOSCOPY (EGD);  Surgeon: Wonda Horner, MD;  Location: [facility] ENDOSCOPY;  Service: Endoscopy;  Laterality: Left;    No family history on file.  History   Substance Use Topics  . Smoking status: Never Smoker   . Smokeless tobacco: Not on file  . Alcohol Use: 8.4 oz/week    14 Cans of beer per week     Comment: couple cans day      Review of Systems Gen: As per history of present illness, otherwise negative Eyes: no discharge or drainage, no occular pain or visual changes Nose: no epistaxis or rhinorrhea Mouth: no dental pain, no sore throat Neck: no neck pain Lungs: no SOB, cough, wheezing CV: no chest pain, palpitations, dependent edema or orthopnea Abd: no abdominal pain, nausea, vomiting GU: no dysuria or gross hematuria MSK: no myalgias or arthralgias Neuro: no headache, no focal neurologic deficits Skin: no rash Psyche: negative.  Allergies  Lisinopril  Home Medications   Current Outpatient Rx  Name  Route  Sig  Dispense  Refill  . amLODipine (NORVASC) 5 MG tablet   Oral   Take 1 tablet (5 mg total) by mouth daily.   90 tablet   3   . aspirin (ADULT ASPIRIN EC LOW STRENGTH) 81 MG EC tablet   Oral   Take 1 tablet (81 mg total) by mouth daily.   30 tablet   0   . benzonatate (TESSALON) 100 MG capsule   Oral   Take 1 capsule (100 mg total) by mouth 3 (three) times daily as needed for cough.  20 capsule   0   . furosemide (LASIX) 40 MG tablet   Oral   Take 0.5 tablets (20 mg total) by mouth daily.   90 tablet   3   . insulin aspart (NOVOLOG FLEXPEN) 100 UNIT/ML injection      Inject 5-10 units before meals as directed by your sliding scale.   1 vial   12   . insulin glargine (LANTUS) 100 UNIT/ML injection   Subcutaneous   Inject 70 Units into the skin daily.         [name] losartan (COZAAR) 50 MG tablet   Oral   Take 1 tablet (50 mg total) by mouth daily.   90 tablet   3   . metoprolol succinate (TOPROL-XL) 100 MG 24 hr tablet   Oral   Take 1 tablet (100 mg total) by mouth daily.   30 tablet   0   . omeprazole (PRILOSEC OTC) 20 MG tablet   Oral   Take 20 mg by mouth daily.         .  pravastatin (PRAVACHOL) 80 MG tablet   Oral   Take 1 tablet (80 mg total) by mouth at bedtime. for Cholesterol   90 tablet   3   . sildenafil (VIAGRA) 100 MG tablet   Oral   Take 1 tablet (100 mg total) by mouth as needed for erectile dysfunction. Take 1 tablet 30 minutes to 4 hours before intercourse   30 tablet   3     BP 119/90  Pulse 76  Temp(Src) 97.4 F (36.3 C) (Oral)  Resp 15  SpO2 99%  Physical Exam Gen: well developed and obese, alert and oriented to person place, date. Head: NCAT Eyes: PERL, EOMI Nose: no epistaixis or rhinorrhea Mouth/throat: mucosa is moist and pink Neck: supple, no stridor Lungs: CTA B, no wheezing, rhonchi or rales Heart: Regular rate and rhythm, no murmur, extremities well perfused Abd: Obese, soft, notender, nondistended Back: no ttp, no cva ttp Skin: no rashese, wnl Neuro: CN ii-xii grossly intact, the patient had slurred speech on my initial evaluation. This cleared after the patient was administered one half an amp of D50. Motor strength is 5 over 5 in both the upper and lower extremities, normal finger to nose coordination. Psyche; normal affect,  calm and cooperative.   ED Course  Procedures (including critical care time)  Results for orders placed during the hospital encounter of 01/24/13 (from the past 24 hour(s))  GLUCOSE, CAPILLARY     Status: None   Collection Time    01/24/13  3:16 AM      Result Value Range   Glucose-Capillary 80  70 - 99 mg/dL  GLUCOSE, CAPILLARY     Status: None   Collection Time    01/24/13  3:30 AM      Result Value Range   Glucose-Capillary 70  70 - 99 mg/dL  CBC     Status: Abnormal   Collection Time    01/24/13  3:41 AM      Result Value Range   WBC 6.8  4.0 - 10.5 K/uL   RBC 3.93 (*) 4.22 - 5.81 MIL/uL   Hemoglobin 10.2 (*) 13.0 - 17.0 g/dL   HCT 31.3 (*) 39.0 - 52.0 %   MCV 79.6  78.0 - 100.0 fL   MCH 26.0  26.0 - 34.0 pg   MCHC 32.6  30.0 - 36.0 g/dL   RDW 21.5 (*) 11.5 - 15.5 %  Platelets 310  150 - 400 K/uL  HEPATIC FUNCTION PANEL     Status: Abnormal   Collection Time    01/24/13  3:41 AM      Result Value Range   Total Protein 8.3  6.0 - 8.3 g/dL   Albumin 3.2 (*) 3.5 - 5.2 g/dL   AST 38 (*) 0 - 37 U/L   ALT 28  0 - 53 U/L   Alkaline Phosphatase 103  39 - 117 U/L   Total Bilirubin 0.2 (*) 0.3 - 1.2 mg/dL   Bilirubin, Direct <0.1  0.0 - 0.3 mg/dL   Indirect Bilirubin NOT CALCULATED  0.3 - 0.9 mg/dL  POCT I-STAT TROPONIN I     Status: None   Collection Time    01/24/13  3:54 AM      Result Value Range   Troponin i, poc 0.00  0.00 - 0.08 ng/mL   Comment 3           POCT I-STAT, CHEM 8     Status: Abnormal   Collection Time    01/24/13  3:55 AM      Result Value Range   Sodium 142  135 - 145 mEq/L   Potassium 4.6  3.5 - 5.1 mEq/L   Chloride 99  96 - 112 mEq/L   BUN 12  6 - 23 mg/dL   Creatinine, Ser 1.40 (*) 0.50 - 1.35 mg/dL   Glucose, Bld 121 (*) 70 - 99 mg/dL   Calcium, Ion 1.09 (*) 1.12 - 1.23 mmol/L   TCO2 34  0 - 100 mmol/L   Hemoglobin 12.6 (*) 13.0 - 17.0 g/dL   HCT 37.0 (*) 39.0 - 52.0 %  GLUCOSE, CAPILLARY     Status: Abnormal   Collection Time    01/24/13  4:07 AM      Result Value Range   Glucose-Capillary 107 (*) 70 - 99 mg/dL  GLUCOSE, CAPILLARY     Status: Abnormal   Collection Time    01/24/13  5:49 AM      Result Value Range   Glucose-Capillary 106 (*) 70 - 99 mg/dL  URINALYSIS, ROUTINE W REFLEX MICROSCOPIC     Status: Abnormal   Collection Time    01/24/13  6:07 AM      Result Value Range   Color, Urine YELLOW  YELLOW   APPearance CLEAR  CLEAR   Specific Gravity, Urine 1.010  1.005 - 1.030   pH 6.5  5.0 - 8.0   Glucose, UA 250 (*) NEGATIVE mg/dL   Hgb urine dipstick NEGATIVE  NEGATIVE   Bilirubin Urine NEGATIVE  NEGATIVE   Ketones, ur NEGATIVE  NEGATIVE mg/dL   Protein, ur 30 (*) NEGATIVE mg/dL   Urobilinogen, UA 0.2  0.0 - 1.0 mg/dL   Nitrite NEGATIVE  NEGATIVE   Leukocytes, UA NEGATIVE  NEGATIVE  URINE  MICROSCOPIC-ADD ON     Status: None   Collection Time    01/24/13  6:07 AM      Result Value Range   Squamous Epithelial / LPF RARE  RARE   WBC, UA 0-2  <3 WBC/hpf   RBC / HPF 0-2  <3 RBC/hpf   Bacteria, UA RARE  RARE   EKG: nsr, no acute ischemic changes, normal intervals, normal axis, normal qrs complex  Ct Head Wo Contrast  01/24/2013  *RADIOLOGY REPORT*  Clinical Data: Hypoglycemia; slurred speech.  CT HEAD WITHOUT CONTRAST  Technique:  Contiguous axial images were obtained from the base of  the skull through the vertex without contrast.  Comparison: CT of the head performed 12/06/2012  Findings: There is no evidence of acute infarction, mass lesion, or intra- or extra-axial hemorrhage on CT.  The posterior fossa, including the cerebellum, brainstem and fourth ventricle, is within normal limits.  The third and lateral ventricles, and basal ganglia are unremarkable in appearance.  The cerebral hemispheres are symmetric in appearance, with normal gray- white differentiation.  No mass effect or midline shift is seen.  There is no evidence of fracture; visualized osseous structures are unremarkable in appearance.  The visualized portions of the orbits are within normal limits.  The paranasal sinuses and mastoid air cells are well-aerated.  Mild soft tissue swelling is noted posteriorly at the vertex.  IMPRESSION:  1.  No acute intracranial pathology seen on CT. 2.  Mild soft tissue swelling noted posteriorly at the vertex.   Original Report Authenticated By: Santa Lighter, M.D.        MDM  Patient with recurrent and sever hypoglycemia with 3rd episode of seeking emergency care in the past week. Patient has tolerated po intake.  I have discussed the case with the [facility] resident on call who will admit the patient for further evaluation of recurrent hypoglycemia.         Elyn Peers, MD 01/24/13 [phone]

## 2013-01-24 NOTE — H&P (Signed)
[facility] Admission History and Physical Service Pager: [phone]  Patient name: Jeffrey [name] Medical record number: [id number] Date of birth: [birth year] Age: 47 y.o. Gender: male  Primary Care Provider: Teresa Coombs, DO  Chief Complaint: Unresponsive, hypoglycemia  Assessment and Plan: Jeffrey [name] is a 47 y.o. year old male with a history of diabetes type 2, hypertension, alcohol abuse, obstructive sleep apnea admitted following an unresponsive event when patient was also found to be hypoglycemic with a blood sugar down to 19.   1. Unresponsive event due to hypoglycemia A: improved with dextrose and oral intake. Patient with recurrent symptomatic hypoglycemia over past 10 days. Insulin dose is supra therapeutic and I highly suspect that his alcohol intake if greater than he reports.  P:  Continue diet. q4 AC, HS CBG monitoring  No insulin for now.  Checking ETOH level and A1c.   2. HTN:  A: patient missed home meds yesterday.  P: home meds.   3. Alcohol abuse: P: CIWA protocol. Check blood alcohol level.   FEN/GI: Carb modified, IVF saline lock.   Prophylaxis: lovenox  Disposition: to home pending stable blood sugar. On significantly decreased dose of lantus.   Code Status: Full  Chief Complaint: Unresponsive, hypoglycemia  History of Present Illness: Jeffrey [name] is a 47 y.o. year old male with a history of diabetes type 2, hypertension, alcohol abuse, obstructive sleep apnea admitted following an unresponsive event early at 2 AM on 01/24/13. The  patient was also found unresponsive in his bed. Upon initial evaluation he was found to be hypoglycemic with a blood sugar down to 19, EMS gave d10 and his blood sugar went up to 61. He became alert and tolerating orals. He reports that since he was discharged from the hospital on 12/14/2012 following an admission for cardiac arrest his blood sugars have been low,  ranging from 40s to 80s  in  association with anorexia and early satiety. He admits that he is not taking NovoLog due to low blood sugars and poor po intake. He is  taking reduced dose Lantus. He  last took 40 units of Lantus 2 evenings ago. Over the last 10 days he's had multiple low blood sugars in the 40s. He denies associated chest pain,  nausea, vomiting, diarrhea. He admits that when his blood sugar is low he becomes diaphoretic. He admits to drinking alcohol 1 to 2 cans of beer 3-4 days a week. He said he last drank 2 days ago.   Patient Active Problem List  Diagnosis  . DIABETES MELLITUS II, UNCOMPLICATED  . HYPERCHOLESTEROLEMIA  . OBESITY, NOS  . ANXIETY  . ERECTILE DYSFUNCTION  . INSOMNIA, CHRONIC  . 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 HYPERTENSION, BENIGN SYSTEMIC  . ARTHRITIS  . ALCOHOL ABUSE, HX OF  . Leg wound, left  . Venous insufficiency  . GI bleed  . Acute respiratory failure  . Hypoglycemia  . Cardiac arrest  . Diarrhea  . DM2 (diabetes mellitus, type 2)  . Obesity  . Anoxic-ischemic encephalopathy  . Acute renal insufficiency  . Pulmonary edema  . OSA on CPAP   Past Medical History: Past Medical History  Diagnosis Date  . Hypertension   . Blood transfusion   . Diabetes mellitus   . Venous insufficiency 12/13/2011  . Cardiac arrest 12/07/2012  . OSA (obstructive sleep apnea) 12/07/2012  . Anxiety   . Acute renal insufficiency 12/08/2012   Past Surgical History: Past Surgical  History  Procedure Laterality Date  . Knee arthroscopy    . Esophagogastroduodenoscopy Left 11/26/2012    Procedure: ESOPHAGOGASTRODUODENOSCOPY (EGD);  Surgeon: Wonda Horner, MD;  Location: [facility] ENDOSCOPY;  Service: Endoscopy;  Laterality: Left;   Social History: History  Substance Use Topics  . Smoking status: Never Smoker   . Smokeless tobacco: Not on file  . Alcohol Use: 8.4 oz/week    14 Cans of beer per week     Comment: couple cans day   For any additional social history documentation,  please refer to relevant sections of EMR.  Family History: No family history on file. Allergies: Allergies  Allergen Reactions  . Lisinopril     Pt reports nose bleed.   No current facility-administered medications on file prior to encounter.   Current Outpatient Prescriptions on File Prior to Encounter  Medication Sig Dispense Refill  . amLODipine (NORVASC) 5 MG tablet Take 1 tablet (5 mg total) by mouth daily.  90 tablet  3  . aspirin (ADULT ASPIRIN EC LOW STRENGTH) 81 MG EC tablet Take 1 tablet (81 mg total) by mouth daily.  30 tablet  0  . benzonatate (TESSALON) 100 MG capsule Take 1 capsule (100 mg total) by mouth 3 (three) times daily as needed for cough.  20 capsule  0  . furosemide (LASIX) 40 MG tablet Take 0.5 tablets (20 mg total) by mouth daily.  90 tablet  3  . insulin aspart (NOVOLOG FLEXPEN) 100 UNIT/ML injection Inject 5-10 units before meals as directed by your sliding scale.  1 vial  12  . insulin glargine (LANTUS) 100 UNIT/ML injection Inject 70 Units into the skin daily.      [name] losartan (COZAAR) 50 MG tablet Take 1 tablet (50 mg total) by mouth daily.  90 tablet  3  . metoprolol succinate (TOPROL-XL) 100 MG 24 hr tablet Take 1 tablet (100 mg total) by mouth daily.  30 tablet  0  . omeprazole (PRILOSEC OTC) 20 MG tablet Take 20 mg by mouth daily.      . pravastatin (PRAVACHOL) 80 MG tablet Take 1 tablet (80 mg total) by mouth at bedtime. for Cholesterol  90 tablet  3  . sildenafil (VIAGRA) 100 MG tablet Take 1 tablet (100 mg total) by mouth as needed for erectile dysfunction. Take 1 tablet 30 minutes to 4 hours before intercourse  30 tablet  3   Review Of Systems: Per HPI  GI: admits to heartburn, taking alka seltzer plus.   Physical Exam: BP 145/88  Pulse 78  Temp(Src) 97.4 F (36.3 C) (Oral)  Resp 9  SpO2 100% Exam: General: Awake, alert, NAD.  HEENT: NCAT, pupils small, reactive, EOMI, dried blood in left external auditory canal, normal right EAC, TM intact  b/l. Patient has short jaw, oropharynx clear.  Cardiovascular: SIS2 RRR, no MRG Respiratory: normal WOB, CTA b/l  Abdomen: obese, not, decreased BS, no masses, no tenderness  Extremities: non-traumatic, no edema Skin: normal, no rash Neuro: A&O x3, CN 2-12 intact, 5/5 strength all muscle groups, sensation intact.   Labs and Imaging: CBC BMET   Recent Labs Lab 01/24/13 0341 01/24/13 0355  WBC 6.8  --   HGB 10.2* 12.6*  HCT 31.3* 37.0*  PLT 310  --     Recent Labs Lab 01/24/13 0355  NA 142  K 4.6  CL 99  BUN 12  CREATININE 1.40*  GLUCOSE 121*     CT head w/o 01/24/2013 IMPRESSION:  1. No acute  intracranial pathology seen on CT.  2. Mild soft tissue swelling noted posteriorly at the vertex.   Boykin Nearing, MD 01/24/2013, 11:13 AM

## 2013-01-24 NOTE — ED Notes (Addendum)
Unresponsive ~ 0200. 2nd time. Yesterday, 1600 - cbg 28 and pt. Refused to come in. cbg Tonite was 19. ems gave d10, and cbg is now 64. It is now 69 after eating [place] sandwich. Taking lantus. No insulin injection in 4 days, that includes lantus. Pt. Coming from home.

## 2013-01-24 NOTE — Progress Notes (Signed)
cbg 28 -1 amp D50 given iv along with juice. Pt c/o blurred vision and states  He had been diaphoretic, which was not noted when D 50 administered. No loss of consciousness. Mdf paged

## 2013-01-24 NOTE — H&P (Signed)
[facility] Attending Admission Note: Sara Neal MD [phone] pager office [phone] I  have seen and examined this patient, reviewed their chart. I have discussed this patient with the resident. I agree with the resident's findings, assessment and care plan. 

## 2013-01-25 ENCOUNTER — Institutional Professional Consult (permissible substitution): Payer: Managed Care, Other (non HMO) | Admitting: Cardiovascular Disease

## 2013-01-25 ENCOUNTER — Other Ambulatory Visit: Payer: Self-pay | Admitting: Sports Medicine

## 2013-01-25 ENCOUNTER — Ambulatory Visit: Payer: Managed Care, Other (non HMO) | Admitting: Family Medicine

## 2013-01-25 DIAGNOSIS — I10 Essential (primary) hypertension: Secondary | ICD-10-CM

## 2013-01-25 DIAGNOSIS — E669 Obesity, unspecified: Secondary | ICD-10-CM

## 2013-01-25 DIAGNOSIS — E119 Type 2 diabetes mellitus without complications: Secondary | ICD-10-CM

## 2013-01-25 DIAGNOSIS — E162 Hypoglycemia, unspecified: Secondary | ICD-10-CM

## 2013-01-25 DIAGNOSIS — F411 Generalized anxiety disorder: Secondary | ICD-10-CM

## 2013-01-25 LAB — RAPID URINE DRUG SCREEN, HOSP PERFORMED
Amphetamines: NOT DETECTED
Cocaine: NOT DETECTED
Opiates: NOT DETECTED
Tetrahydrocannabinol: NOT DETECTED

## 2013-01-25 LAB — BASIC METABOLIC PANEL
BUN: 10 mg/dL (ref 6–23)
Calcium: 9 mg/dL (ref 8.4–10.5)
Creatinine, Ser: 0.96 mg/dL (ref 0.50–1.35)
GFR calc Af Amer: >90 mL/min (ref >90)
GFR calc non Af Amer: >90 mL/min (ref >90)

## 2013-01-25 LAB — GLUCOSE, CAPILLARY
Glucose-Capillary: 61 mg/dL — ABNORMAL LOW (ref 70–99)
Glucose-Capillary: 105 mg/dL — ABNORMAL HIGH (ref 70–99)
Glucose-Capillary: 175 mg/dL — ABNORMAL HIGH (ref 70–99)
Glucose-Capillary: 164 mg/dL — ABNORMAL HIGH (ref 70–99)
Glucose-Capillary: 221 mg/dL — ABNORMAL HIGH (ref 70–99)
Glucose-Capillary: 247 mg/dL — ABNORMAL HIGH (ref 70–99)
Glucose-Capillary: 190 mg/dL — ABNORMAL HIGH (ref 70–99)
Glucose-Capillary: 180 mg/dL — ABNORMAL HIGH (ref 70–99)

## 2013-01-25 LAB — INSULIN, RANDOM: Insulin: 26 u[IU]/mL (ref 3–28)

## 2013-01-25 LAB — CBC
MCHC: 32.1 g/dL (ref 30.0–36.0)
Platelets: 312 10*3/uL (ref 150–400)
RDW: 22 % — ABNORMAL HIGH (ref 11.5–15.5)

## 2013-01-25 MED ORDER — TRAZODONE HCL 50 MG PO TABS
50.0000 mg | ORAL_TABLET | Freq: Every day | ORAL | Status: DC
  Administered 2013-01-25 – 2013-01-26 (×2): 50 mg via ORAL
  Filled 2013-01-25 (×3): qty 1

## 2013-01-25 MED ORDER — GLUCOSE 40 % PO GEL
ORAL | Status: AC
  Administered 2013-01-25: 37.5 g
  Filled 2013-01-25: qty 1

## 2013-01-25 MED ORDER — ENOXAPARIN SODIUM 60 MG/0.6ML ~~LOC~~ SOLN
0.5000 mg/kg | SUBCUTANEOUS | Status: DC
  Administered 2013-01-25 – 2013-01-27 (×3): 60 mg via SUBCUTANEOUS
  Filled 2013-01-25 (×3): qty 0.6

## 2013-01-25 MED ORDER — DEXTROSE 50 % IV SOLN
INTRAVENOUS | Status: AC
  Administered 2013-01-25: 25 mL
  Filled 2013-01-25: qty 50

## 2013-01-25 NOTE — Progress Notes (Signed)
Placed patient on CPAP QHS.  Patient tolerating CPAP well.  RT will continue to monitor.

## 2013-01-25 NOTE — Progress Notes (Signed)
I discussed with Dr Otis Dials.  I agree with their plans documented in their progress note for today.

## 2013-01-25 NOTE — Progress Notes (Signed)
Utilization Review Completed.   Larrie Lucia, RN, BSN Nurse Case Manager  [phone]  

## 2013-01-25 NOTE — Progress Notes (Signed)
INITIAL NUTRITION ASSESSMENT  DOCUMENTATION CODES Per approved criteria  -Obesity Unspecified   INTERVENTION: 1. RD will follow calorie count   NUTRITION DIAGNOSIS: Altered nutrition related lab values related to DM  as evidenced by hypoglycemia .   Goal: Blood sugar control  Monitor:  PO intake, weight trends, labs, calorie count   Reason for Assessment: Consult-Calorie Count   47 y.o. male  Admitting Dx: Hypoglycemia  ASSESSMENT: Pt was admitted after wife called EMS for pt with hypoglycemia and being unresponsive. Pt has had multiple episodes of hypoglycemia over the past few weeks per report.  RD was consulted for calorie count. RD spoke with pt who states he has had a poor appetite for about 1 1/2 months, has lost about 20 lbs. He will be hungry, but loses his appetite when he starts to eat. Mostly drinks liquids, "soup", expect his appetite and intake is altered related to Etoh intake.  Weight hx does show some weight loss in the past several months, but currently weight is consistent with usual body weight.   Calorie counts are typically only used to determine if nutrition support is warranted. Pt is eating well, 50-75% of meals so far. RD will evaluate calorie intake after 24 hours.   Height: Ht Readings from Last 1 Encounters:  01/24/13 5\' 10"  (1.778 m)    Weight: Wt Readings from Last 1 Encounters:  01/25/13 273 lb 12.8 oz (124.195 kg)    Ideal Body Weight: 166 lbs   % Ideal Body Weight: 164%  Wt Readings from Last 10 Encounters:  01/25/13 273 lb 12.8 oz (124.195 kg)  12/29/12 260 lb (117.935 kg)  12/23/12 265 lb 12.8 oz (120.566 kg)  12/08/12 282 lb 10.1 oz (128.2 kg)  11/26/12 274 lb 4 oz (124.4 kg)  11/26/12 274 lb 4 oz (124.4 kg)  12/02/11 275 lb (124.739 kg)  11/26/11 275 lb (124.739 kg)  09/02/11 260 lb 9.6 oz (118.207 kg)  08/20/11 266 lb 3.2 oz (120.748 kg)    Usual Body Weight: ~275 lbs   % Usual Body Weight: 99%  BMI:  Body mass index  is 39.29 kg/(m^2). Obesity class 2  Estimated Nutritional Needs: Kcal: 2200-2400 Protein: 80-90 gm  Fluid: >/= 2 L   Skin: intact   Diet Order: General  EDUCATION NEEDS: -No education needs identified at this time   Intake/Output Summary (Last 24 hours) at 01/25/13 1414 Last data filed at 01/25/13 1325  Gross per 24 hour  Intake 2838.33 ml  Output   1050 ml  Net 1788.33 ml    Last BM: PTA    Labs:   Recent Labs Lab 01/24/13 0355 01/24/13 1201 01/25/13 0725  NA 142  --  133*  K 4.6  --  3.7  CL 99  --  94*  CO2  --   --  30  BUN 12  --  10  CREATININE 1.40* 1.00 0.96  CALCIUM  --   --  9.0  GLUCOSE 121*  --  130*    CBG (last 3)   Recent Labs  01/25/13 0801 01/25/13 0954 01/25/13 1156  GLUCAP 105* 175* 164*    Scheduled Meds: . amLODipine  5 mg Oral Daily  . aspirin EC  81 mg Oral Daily  . enoxaparin (LOVENOX) injection  0.5 mg/kg Subcutaneous Q24H  . folic acid  1 mg Oral Daily  . losartan  50 mg Oral Daily  . metoprolol succinate  100 mg Oral QPC supper  . multivitamin with  minerals  1 tablet Oral Daily  . pantoprazole  40 mg Oral Daily  . simvastatin  20 mg Oral q1800  . thiamine  100 mg Oral Daily    Continuous Infusions:   Past Medical History  Diagnosis Date  . Hypertension   . Blood transfusion   . Diabetes mellitus   . Venous insufficiency 12/13/2011  . Cardiac arrest 12/07/2012  . OSA (obstructive sleep apnea) 12/07/2012  . Anxiety   . Acute renal insufficiency 12/08/2012    Past Surgical History  Procedure Laterality Date  . Knee arthroscopy    . Esophagogastroduodenoscopy Left 11/26/2012    Procedure: ESOPHAGOGASTRODUODENOSCOPY (EGD);  Surgeon: Wonda Horner, MD;  Location: [facility] ENDOSCOPY;  Service: Endoscopy;  Laterality: Left;    Orson Slick RD, LDN Pager [phone] After Hours pager [phone]

## 2013-01-25 NOTE — Telephone Encounter (Addendum)
Pt noted to be admitted due to hypoglycemia and unresponsiveness.  Please ensure follow up with me ASAP. Was scheduled for fu appointment with Dr. Barbra Sarks today.

## 2013-01-25 NOTE — Progress Notes (Signed)
Pt having multiple episode of hypoglycemia during night shift, pt placed on continues D5 .45 NS at 100 per MD order, glucose gel given ones, and D50 IV 25cc given. Pt doesn't report any symptom other than blurred vision, we'll continue to monitor.

## 2013-01-25 NOTE — Progress Notes (Signed)
Pt BP elevated this morning 179/108 MD notified no new order at this time.

## 2013-01-25 NOTE — Telephone Encounter (Signed)
Patient still in hospital per family member,left message to schedule Hospital Follow Up  appt once discharged from hospital. [name], Jeffrey Loron

## 2013-01-25 NOTE — Progress Notes (Signed)
Family Medicine Teaching Service Daily Progress Note Service Page: [phone]  Patient Assessment: Jeffrey [name] is a 47 y.o. year old male with a history of diabetes type 2, HTN, alcohol abuse, and obstructive sleep apnea admitted following an unresponsive event in which patient was also found to be hypoglycemic with a blood sugar down to 19.  Subjective: Patient states he is feeling better today.  He is concerned about the recurrent episodes of hypoglycemia that he is experiencing and states that he feels nervous about going to sleep at night for fear he will become unresponsive again.  He is also concerned about the amount of weight that he has lost since his last hospital admission in March; states that he lost "a lot of weight" in the hospital, and that he has gained little of it back.  Tolerating carb modified diet, but endorses some nausea while he was eating his dinner last night.  Able to eat breakfast without issue this morning.  Also endorses dizziness, visual disturbances, and dyspnea when his blood sugar goes low.  Denies any current headache, chest pain, abdominal pain, GI complaints, or weakness.  Objective: Temp:  [98 F (36.7 C)-99.7 F (37.6 C)] 98.4 F (36.9 C) (04/21 0629) Pulse Rate:  [77-91] 79 (04/21 0629) Resp:  [16-18] 18 (04/21 0629) BP: (148-179)/(83-108) 179/108 mmHg (04/21 0629) SpO2:  [97 %-100 %] 98 % (04/21 0629) Weight:  [273 lb 12.8 oz (124.195 kg)] 273 lb 12.8 oz (124.195 kg) (04/21 0629)  Exam: General: Alert, sitting on side of bed. NAD. Cardiovascular: RRR. Normal S1 and S2.  No murmurs, rubs, or gallops. Respiratory: Lungs CTAB.  Normal work of breathing. Abdomen: Soft, non-tender, non-distended.  Obese.  No masses or HSM. Extremities: No edema.  Palpable pulses B/L. Neuro: Oriented to person, place, and time. Moves all extremities spontaneously.  I have reviewed the patient's medications, labs, imaging, and diagnostic testing.  Notable results are  summarized below.  CBC BMET   Recent Labs Lab 01/24/13 0341 01/24/13 0355 01/24/13 1201 01/25/13 0725  WBC 6.8  --  7.4 5.8  HGB 10.2* 12.6* 9.7* 10.2*  HCT 31.3* 37.0* 30.1* 31.8*  PLT 310  --  265 312    Recent Labs Lab 01/24/13 0355 01/24/13 1201 01/25/13 0725  NA 142  --  133*  K 4.6  --  3.7  CL 99  --  94*  CO2  --   --  30  BUN 12  --  10  CREATININE 1.40* 1.00 0.96  GLUCOSE 121*  --  130*  CALCIUM  --   --  9.0      Ethanol level = 73 (01/24/2013).  Plan:  1. Unresponsive event due to hypoglycemia in type 2 diabetic- Still having low CBG's which do improve with D50. Patient with recurrent hypoglycemia over the past 10 days.  No acute pathology on head CT. Differential for hypoglycemia included insulinoma vs chronic alcohol use vs over insulin use. Given low c-peptide unlikely insulinoma. Patient states he has been off of insulin since last Wednesday and he continues to be hypoglycemic in hospital off of insulin, making insulin overdose less likely.         - CBGs 28-133 overnight.  Continue CBG monitoring Q2H.        - HbA1c 9.5        - F/u random insulin        - encourage po intake with regular diet 3 meals a day and 3 snacks a  day with snack before bed time.   2. HTN        - BP 130-179/83-108.        - Continue home meds amlodipine 5mg , losartan 50 and metoprolol 100 24hr. If continues to run high, consider increasing norvasc.   3. h/o Alcohol abuse - Elevated ethanol level on admission.        - Continue CIWA protocol.        - UDS normal  FEN/GI: Carb modified diet. D/c D5 1/2NS and monitor CBG's on regular diet.  PPx: DVT ppx with Lovenox. Dispo: Pending further workup. Code Status: Full code.  Liam Graham, PGY-2 Family Medicine Resident

## 2013-01-26 LAB — BASIC METABOLIC PANEL
CO2: 29 mEq/L (ref 19–32)
Calcium: 9.1 mg/dL (ref 8.4–10.5)
Chloride: 95 mEq/L — ABNORMAL LOW (ref 96–112)
Creatinine, Ser: 1.01 mg/dL (ref 0.50–1.35)
Glucose, Bld: 231 mg/dL — ABNORMAL HIGH (ref 70–99)

## 2013-01-26 LAB — CBC
HCT: 31.7 % — ABNORMAL LOW (ref 39.0–52.0)
MCH: 25.9 pg — ABNORMAL LOW (ref 26.0–34.0)
MCV: 79.1 fL (ref 78.0–100.0)
Platelets: 322 10*3/uL (ref 150–400)
RBC: 4.01 MIL/uL — ABNORMAL LOW (ref 4.22–5.81)
RDW: 21.9 % — ABNORMAL HIGH (ref 11.5–15.5)

## 2013-01-26 LAB — GLUCOSE, CAPILLARY: Glucose-Capillary: 266 mg/dL — ABNORMAL HIGH (ref 70–99)

## 2013-01-26 MED ORDER — INSULIN ASPART 100 UNIT/ML ~~LOC~~ SOLN
0.0000 [IU] | Freq: Three times a day (TID) | SUBCUTANEOUS | Status: DC
  Administered 2013-01-27 (×2): 15 [IU] via SUBCUTANEOUS
  Administered 2013-01-27: 11 [IU] via SUBCUTANEOUS

## 2013-01-26 MED ORDER — INSULIN ASPART 100 UNIT/ML ~~LOC~~ SOLN
0.0000 [IU] | Freq: Three times a day (TID) | SUBCUTANEOUS | Status: DC
  Administered 2013-01-26: 7 [IU] via SUBCUTANEOUS
  Administered 2013-01-26: 5 [IU] via SUBCUTANEOUS

## 2013-01-26 MED ORDER — AMLODIPINE BESYLATE 10 MG PO TABS
10.0000 mg | ORAL_TABLET | Freq: Every day | ORAL | Status: DC
  Administered 2013-01-26 – 2013-01-27 (×2): 10 mg via ORAL
  Filled 2013-01-26 (×2): qty 1

## 2013-01-26 MED ORDER — HYDRALAZINE HCL 20 MG/ML IJ SOLN
5.0000 mg | Freq: Once | INTRAMUSCULAR | Status: AC
  Administered 2013-01-26: 5 mg via INTRAVENOUS
  Filled 2013-01-26: qty 1

## 2013-01-26 NOTE — Progress Notes (Signed)
I discussed with Dr Maricela Bo.  I agree with their plans documented in their progress note for today.

## 2013-01-26 NOTE — Progress Notes (Signed)
Inpatient Diabetes Program Recommendations  AACE/ADA: New Consensus Statement on Inpatient Glycemic Control (2013)  Target Ranges:  Prepandial:   less than 140 mg/dL      Peak postprandial:   less than 180 mg/dL (1-2 hours)      Critically ill patients:  140 - 180 mg/dL   Reason for Assessment: Elevated CBG's after recovery from profound hypoglycemia on admission  Note:  Patient no longer hypoglycemic.  Eating 100%.  Received first dose of insulin, Novolog correction 7 units, with lunch for CBG of 336 mg/dl.  Patient would benefit from receiving some basal insulin-- lower than home dose.  Note MD had lengthy discussion with patient regarding ETOH.  Thank you.  Jeffrey Boss S. Marcelline Mates, RN, CNS, CDE Inpatient Diabetes Program, team pager [phone]

## 2013-01-26 NOTE — Progress Notes (Signed)
Family Medicine Teaching Service PCP Note Service Pager: [phone]  Patient name: Jeffrey [name] Medical record number: [id number] Date of birth: 1966-01-07 Age: 47 y.o. Gender: male Length of Stay:  LOS: 2 days   I met with Jeffrey [name] today in the hospital.  He has been rehospitalized following a PEA arrest due to OSA approximately one month ago.  He is here currently due to profound hypoglycemia.  He reports that he had been taking his Lantus dose as prescribed and subsequently had 4 days of profound hypoglycemia especially in the morning.  The CBG on admission was in the teens.  On admission he was also found to have an alcohol level that was elevated but not as significantly as his prior episodes including 1 in the 300s during a clinic visit.  I spent approximately 25 minutes with Jeffrey [name] today discussing his ongoing medical care including his difficult to control diabetes, poorly controlled hypertension, OSA that has previously resulted in PEA arrest with concerns for prolonged neurologic sequale, and how his alcohol use is affecting the above.    He does have a followup appointment for his obstructive sleep apnea that has been rescheduled for next week.  He also a missed his neurology appointment that was scheduled for yesterday.  He does not seem to capable of recognizing that his alcohol use has any bearing on his medical condition.  I have encouraged him to consider thinking about quitting and have directly told him that I believe he has alcohol problem; it is not normal for patient's to show up to a doctor's appointment intoxicated to a level in the 300s.   I have offered support and counseling resources at this time however the patient does not feel that his drinking is a problem and does not for see himself quitting in the future.    I am more than happy to see him in followup once discharged from this hospitalization and agree with the plan has been set forth by the inpatient team.  He  will need aggressive dietary management as well as insulin modification.  He had previously been on metformin however it was stopped due to acute kidney injury during his last PEA arrest.  He may be a candidate for reinitiation with close monitoring of his kidney function however he has had difficulty with maintaining appropriate followup appointments and I am hesitant to encourage this.  However his glycemic control would undoubtly benefit from metformin.   Gerda Diss, DO [name] Resident - PGY-2 01/26/2013 2:21 PM

## 2013-01-26 NOTE — Progress Notes (Signed)
Calorie Count Note  48 hour calorie count ordered. RD has reviewed meals for the past 24 hr, dinner 4/21-lunch 4/22. Pt intake has been appropriate, 100% most items. Ordering high carbohydrate meals. MD notes indicate that pt has "some decrease in appetite; states that he will take a few bites of a meal, then lose interest in eating".   Diet: Carb Mod- 3 snacks between meals and 1 snack before bed Supplements: none  Breakfast: 4/22- 666 kcal, 82 gm carbohydrate, 33 gm protien Lunch: 4/22- 558 kcal, 67 gm carbohydrate, 19 gm protien Dinner: 4/21- 437 kcal, 52 gm carbohydrate, 17 gm protein Supplements: snacks- 1 honey bun (441 kcal/51 gm carbohydrate) 1 package of cheese crackers (~200 kcal and 22 gm carbohydrate)  Total intake: ~2300 kcal  274 gm Carbohydrate  Per pt, he ate 100% of breakfast and lunch today.   CBG (last 3)   Recent Labs  01/26/13 0340 01/26/13 0837 01/26/13 1200  GLUCAP 166* 266* 336*     Nutrition Dx: Altered nutritionrelated lab values related to DM as evidenced by hypoglycemia.   Goal: Blood sugar control  Intervention: RD will d/c calorie count. Pt is consuming adequate calories and carbohydrates.  Orson Slick RD, LDN Pager [phone] After Hours pager [phone]

## 2013-01-26 NOTE — Progress Notes (Signed)
Family Medicine Teaching Service Daily Progress Note Service Page: [phone]  Patient Assessment: Jeffrey [name] is a 47 y.o. year old male with a history of diabetes type 2, HTN, alcohol abuse, and obstructive sleep apnea admitted following an unresponsive event in which patient was also found to be hypoglycemic with a blood sugar down to 19.  Subjective: No episodes of hypoglycemia overnight.  Patient notes some decrease in appetite; states that he will take a few bites of a meal, then lose interest in eating.  Also describes some "burning" of his eyes when his blood sugar goes above 160.  Denies chest pain, dyspnea, dizziness, abdominal pain, N/V/D, or weakness.  Objective: Temp:  [98.2 F (36.8 C)-98.5 F (36.9 C)] 98.2 F (36.8 C) (04/22 0423) Pulse Rate:  [79-89] 80 (04/22 0423) Resp:  [16-20] 18 (04/22 0423) BP: (146-190)/(89-103) 190/103 mmHg (04/22 0423) SpO2:  [99 %-100 %] 99 % (04/22 0423) Weight:  [274 lb 4 oz (124.4 kg)] 274 lb 4 oz (124.4 kg) (04/22 0423)  Exam: General: Alert, sitting in chair. NAD. Cardiovascular: RRR. Normal S1 and S2.  No murmurs, rubs, or gallops. Respiratory: Lungs CTAB.  Normal work of breathing. Abdomen: Soft, non-tender, non-distended.  Obese.  No masses or HSM. Extremities: No edema.  Palpable pulses B/L. Neuro: Oriented to person, place, and time. Moves all extremities spontaneously.  I have reviewed the patient's medications, labs, imaging, and diagnostic testing.  Notable results are summarized below.  CBC BMET   Recent Labs Lab 01/24/13 1201 01/25/13 0725 01/26/13 0520  WBC 7.4 5.8 5.7  HGB 9.7* 10.2* 10.4*  HCT 30.1* 31.8* 31.7*  PLT 265 312 322    Recent Labs Lab 01/24/13 0355 01/24/13 1201 01/25/13 0725 01/26/13 0520  NA 142  --  133* 133*  K 4.6  --  3.7 4.5  CL 99  --  94* 95*  CO2  --   --  30 29  BUN 12  --  10 9  CREATININE 1.40* 1.00 0.96 1.01  GLUCOSE 121*  --  130* 231*  CALCIUM  --   --  9.0 9.1       Ethanol level = 73 (01/24/2013). Random insulin = 26 (01/25/2013).  Plan:  1. Unresponsive event due to hypoglycemia in type 2 diabetic - CBGs improved overnight with regular diet and snacks. Patient with recurrent hypoglycemia over the past 10 days.  No acute pathology on head CT. Differential for hypoglycemia included insulinoma vs chronic alcohol use vs over insulin use vs possible dumping syndrome. Given low c-peptide unlikely insulinoma. Patient states he has been off of insulin since last Wednesday, and he continues to be hypoglycemic in hospital off of insulin, making insulin overdose less likely. HbA1c 9.5        - CBGs 166-247 overnight.  Continue CBG monitoring Q2H.        - Random insulin within normal limits.        - Encourage po intake with regular diet 3 meals a day and 3 snacks a day with snack before bed time.        - Start low-dose sliding scale insulin.   2. HTN        - BP 140-190/89-103.        - Amlodipine increased to 10 mg.        - Continue losartan 50 and metoprolol 100 24hr.   3. h/o Alcohol abuse - Elevated ethanol level on admission.        -  Continue CIWA protocol.        - UDS normal  4.   OSA/OHS       - CPAP overnight.  FEN/GI: Regular diet. PPx: DVT ppx with Lovenox. Dispo: Pending further workup and blood glucose control. Code Status: Full code.  Marena Chancy Maricela Bo, MD, MBA 01/26/2013, 1:57 PM Family Medicine Resident, PGY-2 [phone]

## 2013-01-27 LAB — BASIC METABOLIC PANEL
BUN: 11 mg/dL (ref 6–23)
Calcium: 9.4 mg/dL (ref 8.4–10.5)
GFR calc non Af Amer: 85 mL/min — ABNORMAL LOW (ref >90)
Glucose, Bld: 381 mg/dL — ABNORMAL HIGH (ref 70–99)

## 2013-01-27 LAB — GLUCOSE, CAPILLARY
Glucose-Capillary: 262 mg/dL — ABNORMAL HIGH (ref 70–99)
Glucose-Capillary: 341 mg/dL — ABNORMAL HIGH (ref 70–99)

## 2013-01-27 MED ORDER — LOSARTAN POTASSIUM 50 MG PO TABS: 100.0000 mg | ORAL_TABLET | Freq: Every day | ORAL | Status: DC

## 2013-01-27 MED ORDER — AMLODIPINE BESYLATE 10 MG PO TABS: 10.0000 mg | ORAL_TABLET | Freq: Every day | ORAL | Status: DC

## 2013-01-27 MED ORDER — INSULIN GLARGINE 100 UNIT/ML ~~LOC~~ SOLN: 10.0000 [IU] | Freq: Two times a day (BID) | SUBCUTANEOUS | Status: DC

## 2013-01-27 NOTE — Progress Notes (Signed)
Family Medicine Teaching Service Daily Progress Note Service Page: [phone]  Patient Assessment: Jeffrey [name] is a 47 y.o. year old male with a history of diabetes type 2, HTN, alcohol abuse, and obstructive sleep apnea admitted following an unresponsive event in which patient was also found to be hypoglycemic with a blood sugar down to 19.  Subjective: No new complaints this morning.  No episodes of hypoglycemia in the past 24 hours.  Denies chest pain, dyspnea, dizziness, palpitations, N/V/D, abdominal pain, or leg/calf pain.  Tolerating carb modified diet.  States that he received insulin last night and this morning.  Ambulating freely without issue.  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  Usually has one large meal around 7:00 PM while working.  His PCP, Dr. Teresa Coombs, saw him in the hospital yesterday and notes that the patient was previously on Metformin, but that it was stopped due to AKI during his PEA arrest in March.  Per Dr. Paulla Fore, patient "may be a candidate for re-initiation with close monitoring of his kidney function; however, he has difficulty with maintaining appropriate follow-up appointments."  Objective: Temp:  [98 F (36.7 C)-98.7 F (37.1 C)] 98 F (36.7 C) (04/23 0738) Pulse Rate:  [70-100] 79 (04/23 0738) Resp:  [18-100] 100 (04/23 0738) BP: (146-176)/(94-105) 150/96 mmHg (04/23 0738) SpO2:  [100 %] 100 % (04/23 0551) Weight:  [269 lb 2.9 oz (122.1 kg)] 269 lb 2.9 oz (122.1 kg) (04/23 0551)  Exam: General: Alert, standing next to bed. NAD. Cardiovascular: RRR. Normal S1 and S2.  No murmurs, rubs, or gallops. Respiratory: Lungs CTAB.  Normal work of  breathing. Abdomen: Soft, non-tender, non-distended.  Obese.  No masses or HSM. Extremities: Trace pedal edema B/L.  Palpable pulses B/L. Neuro: Oriented to person, place, and time. Moves all extremities spontaneously.  I have reviewed the patient's medications, labs, imaging, and diagnostic testing.  Notable results are summarized below.  CBC BMET   Recent Labs Lab 01/24/13 1201 01/25/13 0725 01/26/13 0520  WBC 7.4 5.8 5.7  HGB 9.7* 10.2* 10.4*  HCT 30.1* 31.8* 31.7*  PLT 265 312 322    Recent Labs Lab 01/25/13 0725 01/26/13 0520 01/27/13 0610  NA 133* 133* 131*  K 3.7 4.5 4.3  CL 94* 95* 93*  CO2 30 29 25   BUN 10 9 11   CREATININE 0.96 1.01 1.02  GLUCOSE 130* 231* 381*  CALCIUM 9.0 9.1 9.4      C-peptide <0.10 (01/24/2013). Random insulin = 26 (01/25/2013). Ethanol level = 73 (01/24/2013).  Plan:  1. Unresponsive event due to hypoglycemia in type 2 diabetic - No episodes of hypoglycemia in the past 24 hours. CBGs improved with regular diet and snacks. Patient with recurrent hypoglycemia over the past 10 days prior to admission.  No acute pathology on head CT. Differential for hypoglycemia included insulinoma vs chronic alcohol use vs over insulin use vs possible dumping syndrome. Given low c-peptide unlikely insulinoma. Random insulin within normal limits.  HbA1c 9.5.         - CBGs 216-348 overnight.  Continue CBG monitoring Q4H.        - Carb modified diet.        -  Seen by diabetes coordinator, who recommends basal insulin.         - will discharge patient on lantus 10u bid. Will not re-initiate meal coverage insulin since patient has sporadic meal schedules.   2. HTN        - BP 146-176/95-101        - Continue Amlodipine 10 mg, Losartan 50 mg, and Metoprolol 100 24hr. Current at 150/96. Could consider increasing losartan if needed   3. h/o Alcohol abuse - Elevated ethanol level on admission.        - Continue CIWA protocol.        - UDS normal   4.    OSA/OHS       - CPAP overnight.  FEN/GI: Carb modified diet.  Saline lock. PPx: DVT ppx with Lovenox. Dispo: likely today Code Status: Full code.  Liam Graham, PGY-2 Family Medicine Resident

## 2013-01-27 NOTE — Progress Notes (Signed)
All d/c instructions explained and given to pt.  Verbalized understanding.  Pt decline w/c service.  Left floor via ambulatory with his family.  Karie Kirks, Therapist, sports.

## 2013-01-27 NOTE — Discharge Summary (Signed)
[facility] Discharge Summary  Patient name: Jeffrey [name] Medical record number: [id number] Date of birth: [birth year] Age: 47 y.o. Gender: male Date of Admission: 01/24/2013  Date of Discharge: 01/27/2013 Admitting Physician: Dickie La, MD  Primary Care Provider: Teresa Coombs, DO  Indication for Hospitalization: hypoglycemia and mental status change  Discharge Diagnoses:  1. Unresponsive event secondary to hypoglycemia 2. Type 2 diabetes mellitus 3. HTN 4. h/o Alcohol abuse 5. OSA/OHS  Consultations: None  Significant Labs and Imaging:   CBC    Component Value Date/Time   WBC 5.7 01/26/2013 0520   RBC 4.01* 01/26/2013 0520   HGB 10.4* 01/26/2013 0520   HCT 31.7* 01/26/2013 0520   PLT 322 01/26/2013 0520   MCV 79.1 01/26/2013 0520   MCH 25.9* 01/26/2013 0520   MCHC 32.8 01/26/2013 0520   RDW 21.9* 01/26/2013 0520   LYMPHSABS 1.7 08/18/2011 1610   MONOABS 1.6* 08/18/2011 1610   EOSABS 0.0 08/18/2011 1610   BASOSABS 0.0 08/18/2011 1610    BMET    Component Value Date/Time   NA 131* 01/27/2013 0610   K 4.3 01/27/2013 0610   CL 93* 01/27/2013 0610   CO2 25 01/27/2013 0610   GLUCOSE 381* 01/27/2013 0610   BUN 11 01/27/2013 0610   CREATININE 1.02 01/27/2013 0610   CREATININE 1.16 12/23/2012 1414   CREATININE 4.36* 10/17/2007 1248   CALCIUM 9.4 01/27/2013 0610   CALCIUM 8.8 10/20/2007 0535   GFRNONAA 85* 01/27/2013 0610   GFRAA >90 01/27/2013 0610    Lipid Panel     Component Value Date/Time   CHOL 216* 12/04/2009 1858   TRIG 95 12/04/2009 1858   HDL 84 12/04/2009 1858   CHOLHDL 2.6 Ratio 12/04/2009 1858   VLDL 19 12/04/2009 1858   LDLCALC 113* 12/04/2009 1858    Drugs of Abuse     Component Value Date/Time   LABOPIA NONE DETECTED 01/25/2013 0821   COCAINSCRNUR NONE DETECTED 01/25/2013 0821   LABBENZ NONE DETECTED 01/25/2013 0821   AMPHETMU NONE DETECTED 01/25/2013 0821   THCU NONE DETECTED 01/25/2013 0821   LABBARB NONE DETECTED 01/25/2013 0821       Alcohol Level    Component Value Date/Time   ETH 73* 01/24/2013 1201    HbA1c = 9.5 (01/24/2013) C-peptide <0.10 (01/24/2013) Random insulin = 26 (01/25/2013)  CT HEAD WITHOUT CONTRAST (01/24/2013)  Technique: Contiguous axial images were obtained from the base of  the skull through the vertex without contrast.  Comparison: CT of the head performed 12/06/2012  Findings: There is no evidence of acute infarction, mass lesion, or  intra- or extra-axial hemorrhage on CT.  The posterior fossa, including the cerebellum, brainstem and fourth  ventricle, is within normal limits. The third and lateral  ventricles, and basal ganglia are unremarkable in appearance. The  cerebral hemispheres are symmetric in appearance, with normal gray-  white differentiation. No mass effect or midline shift is seen.  There is no evidence of fracture; visualized osseous structures are  unremarkable in appearance. The visualized portions of the orbits  are within normal limits. The paranasal sinuses and mastoid air  cells are well-aerated. Mild soft tissue swelling is noted  posteriorly at the vertex.  IMPRESSION:  1. No acute intracranial pathology seen on CT.  2. Mild soft tissue swelling noted posteriorly at the vertex.   Procedures: None.  Brief Hospital Course: Jeffrey [name] is a 1-y.o. AAM with a h/o Type 2 diabetes mellitus, HTN, alcohol abuse, and  OSA/OHS who presented to the ED following an unresponsive event due to hypoglycemia.  Patient admitted as inpatient for blood glucose control and insulin management.  CBGs improved with regular diet and snacks.  Tolerated sliding scale insulin with no further episodes of hypoglycemia.  Once CBGs stabilized, patient was discharged on long-acting insulin regimen with close PCP follow-up.   1. Unresponsive event due to hypoglycemia - Likely secondary to unchanged insulin regimen despite recent weight loss.  Patient found unresponsive in bed at home with  blood glucose in the 30s, which was treated with amps of D50 by EMS and in the ED.  Per patient's wife, patient experienced recurrent hypoglycemia in the 10 days prior to hospital admission.  Continued to have episodes of hypoglycemia in the first few hours of hospitalization while off of insulin. No acute pathology on head CT.  Insulinoma unlikely given low C-peptide.  Random insulin within normal limits.    - Encouraged to eat 3 meals a day with snacks in between and before bed to prevent hypoglycemic episodes within insulin regimen.    - on discharge, patient's CBG's were between 260 to 340's on novolog sliding scale, without lantus.    2. Type 2 diabetes mellitus - HbA1c 9.5.  Patient unlikely to adhere with short-acting insulin regimen due to his work schedule and his irregular meal/break schedule.      - Discharged on Lantus 10 units BID.    - Encouraged to check blood sugar 3 times daily and before bed.  3. HTN    - Blood pressures were elevated to the [id number] to [id number] systolic and losartan was increased from 50mg  to 100mg  daily and amlodipine from 5mg  to 10mg . Patient remained on home Metoprolol XR 100 mg daily.   4. h/o Alcohol abuse - Elevated ethanol level on admission.  UDS negative on admission.  On CIWA protocol during hospitalization, but patient showed no signs of withdrawal Per PCP, patient does not feel that drinking is a problem and does not foresee himself quitting in the future.    - Offered support and counseling resources for alcohol cessation as this is likely contributed to hypoglycemia.   5. OSA/OHS    - Continue CPAP overnight at home.   Discharge Medications:    Medication List    STOP taking these medications       benzonatate 100 MG capsule  Commonly known as:  TESSALON     insulin aspart 100 UNIT/ML injection  Commonly known as:  NOVOLOG FLEXPEN      TAKE these medications       amLODipine 10 MG tablet  Commonly known as:  NORVASC  Take 1 tablet (10 mg  total) by mouth daily.     aspirin 81 MG EC tablet  Commonly known as:  ADULT ASPIRIN EC LOW STRENGTH  Take 1 tablet (81 mg total) by mouth daily.     furosemide 40 MG tablet  Commonly known as:  LASIX  Take 0.5 tablets (20 mg total) by mouth daily.     insulin glargine 100 UNIT/ML injection  Commonly known as:  LANTUS  Inject 0.1 mLs (10 Units total) into the skin 2 (two) times daily.     losartan 50 MG tablet  Commonly known as:  COZAAR  Take 2 tablets (100 mg total) by mouth daily.     metoprolol succinate 100 MG 24 hr tablet  Commonly known as:  TOPROL-XL  Take 1 tablet (100 mg total) by mouth daily.  omeprazole 20 MG tablet  Commonly known as:  PRILOSEC OTC  Take 20 mg by mouth daily.     pravastatin 80 MG tablet  Commonly known as:  PRAVACHOL  Take 1 tablet (80 mg total) by mouth at bedtime. for Cholesterol     traZODone 50 MG tablet  Commonly known as:  DESYREL  Take 1 tablet by mouth at bedtime as needed. sleep     VIAGRA 100 MG tablet  Generic drug:  sildenafil  TAKE 1 TABLET ONE-HALF (1/2) TO 4 HOURS BEFORE INTERCOURSE       Issues for Follow Up:     - Diabetes management and glycemic control.    - Blood pressure management.    - Effects of alcohol abuse and possible alcohol cessation.  Outstanding Results: None.  Discharge Instructions: Please refer to Patient Instructions section of EMR for full details.  Patient was counseled important signs and symptoms that should prompt return to medical care, changes in medications, dietary instructions, activity restrictions, and follow up appointments.  Follow-up Information   Follow up with Conni Slipper, MD On 02/04/2013. (3:30 PM)    Contact information:   [place] [address] [phone]       Discharge Condition: Stable.  Liam Graham, MD 01/28/2013, 10:12 PM

## 2013-01-28 NOTE — Progress Notes (Signed)
I discussed with  Dr Losq.  I agree with their plans documented in their progress note for today.  

## 2013-01-29 NOTE — Discharge Summary (Signed)
I discussed with Dr Otis Dials.  I agree with their plans documented in their discharge note for today.

## 2013-02-02 ENCOUNTER — Encounter: Payer: Self-pay | Admitting: Pulmonary Disease

## 2013-02-02 ENCOUNTER — Ambulatory Visit (INDEPENDENT_AMBULATORY_CARE_PROVIDER_SITE_OTHER): Payer: Managed Care, Other (non HMO) | Admitting: Pulmonary Disease

## 2013-02-02 VITALS — BP 154/100 | HR 74 | Temp 98.1°F | Ht 70.0 in | Wt 276.6 lb

## 2013-02-02 DIAGNOSIS — Z9989 Dependence on other enabling machines and devices: Secondary | ICD-10-CM

## 2013-02-02 DIAGNOSIS — G4733 Obstructive sleep apnea (adult) (pediatric): Secondary | ICD-10-CM

## 2013-02-02 NOTE — Assessment & Plan Note (Signed)
The patient has a history of mild to moderate obstructive sleep apnea, and has been on CPAP over the last month.  His download shows fairly good compliance, as well as adequate control of his sleep apnea.  The patient feels that his sleep and daytime alertness had improved, and I have asked him to continue on CPAP with the automatic setting.  I had a long discussion with him about the pathophysiology of sleep apnea, including its impact to his quality of life and cardiovascular health.  He will followup with me in 6 months if doing well.

## 2013-02-02 NOTE — Progress Notes (Signed)
Subjective:    Patient ID: Jeffrey [name], male    DOB: 14-Jan-1966, 47 y.o.   MRN: [id number]  HPI The patient is a 47 year old male who been asked to see for management of obstructive sleep apnea.  He was diagnosed last month with mild to moderate OSA, with an AHI of 17 events per hour.  He has since been started on CPAP, and is currently on the automatic setting.  His download for the last 4 weeks shows excellent compliance, as well as good control of his AHI.  Patient is currently using a full face mask, and feels that it is fairly comfortable.  He is also using a heated humidifier.  He is having no issues with pressure tolerates, and believes the mask but currently is adequate.  Since being on CPAP, the patient has noticed decreased awakenings, and he feels more rested in the mornings upon arising.  He denies today any significant daytime sleepiness with inactivity, and has no sleepiness with driving.  His Epworth score today is normal at 9, and he tells me that his weight is down 30 pounds over the last 2 years.   Sleep Questionnaire What time do you typically go to bed?( Between what hours) 12am 12am at 0900 on 02/02/13 by Danella Maiers, CMA How long does it take you to fall asleep? 1-2hrs 1-2hrs at 0900 on 02/02/13 by Danella Maiers, CMA How many times during the night do you wake up? 4 4 at 0900 on 02/02/13 by Danella Maiers, CMA What time do you get out of bed to start your day? 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 (In pounds) 30 lb (13.608 kg)30 lb (13.608 kg) 30lb decrease at 0900 on 02/02/13 by Danella Maiers, CMA Have you ever had a sleep study before? Yes Yes at 0900 on 02/02/13 by Danella Maiers, CMA If yes, location of study? [facility] at 0900 on 02/02/13 by Danella Maiers, CMA If yes, date of study? 12/29/12 12/29/12 at 0900 on 02/02/13 by Danella Maiers, CMA Do you currently use CPAP? Yes Yes at 0900 on 02/02/13 by Ivy Lynn Mabe, CMA If so, what pressure? AUTO? AUTO? at 0900 on 02/02/13 by Danella Maiers, CMA Do you wear oxygen at any time? No No at 0900 on 02/02/13 by Ivy Lynn Mabe, CMA   Review of Systems  Constitutional: Negative for fever and unexpected weight change.  HENT: Positive for rhinorrhea. Negative for ear pain, nosebleeds, congestion, sore throat, sneezing, trouble swallowing, dental problem, postnasal drip and sinus pressure.   Eyes: Negative for redness and itching.  Respiratory: Negative for cough, chest tightness, shortness of breath and wheezing.   Cardiovascular: Positive for leg swelling. Negative for palpitations.  Gastrointestinal: Negative for nausea and vomiting.  Genitourinary: Negative for dysuria.  Musculoskeletal: Negative for joint swelling.  Skin: Negative for rash.  Neurological: Negative for headaches.  Hematological: Does not bruise/bleed easily.  Psychiatric/Behavioral: Negative for dysphoric mood. The patient is not nervous/anxious.        Objective:   Physical Exam Constitutional:  Obese male, no acute distress  HENT:  Nares patent without discharge, but deviated septum to left with narrowing.  Oropharynx without exudate, palate and uvula are thick and elongated.  Eyes:  Perrla, eomi, no scleral icterus  Neck:  No JVD, no TMG  Cardiovascular:  Normal rate, regular rhythm, no rubs or gallops.  No murmurs        Intact distal pulses  Pulmonary :  Normal breath sounds, no stridor or respiratory distress   No rales, rhonchi, or wheezing  Abdominal:  Soft, nondistended, bowel sounds present.  No tenderness noted.   Musculoskeletal:  No lower extremity edema noted.  Lymph Nodes:  No cervical lymphadenopathy noted  Skin:  No cyanosis noted  Neurologic:  Alert, appropriate, moves all 4  extremities without obvious deficit.         Assessment & Plan:

## 2013-02-02 NOTE — Patient Instructions (Addendum)
Stay on cpap on your current settings. Work on weight loss followup with me in 34mos.

## 2013-02-03 ENCOUNTER — Emergency Department (HOSPITAL_COMMUNITY)
Admission: EM | Admit: 2013-02-03 | Discharge: 2013-02-04 | Disposition: A | Discharge status: Home / Self Care | Payer: Managed Care, Other (non HMO) | Attending: Emergency Medicine | Admitting: Emergency Medicine

## 2013-02-03 ENCOUNTER — Encounter (HOSPITAL_COMMUNITY): Payer: Self-pay | Admitting: Emergency Medicine

## 2013-02-03 ENCOUNTER — Telehealth: Payer: Self-pay | Admitting: Family Medicine

## 2013-02-03 DIAGNOSIS — R739 Hyperglycemia, unspecified: Secondary | ICD-10-CM

## 2013-02-03 DIAGNOSIS — Z794 Long term (current) use of insulin: Secondary | ICD-10-CM | POA: Insufficient documentation

## 2013-02-03 DIAGNOSIS — Z8674 Personal history of sudden cardiac arrest: Secondary | ICD-10-CM | POA: Insufficient documentation

## 2013-02-03 DIAGNOSIS — F411 Generalized anxiety disorder: Secondary | ICD-10-CM | POA: Insufficient documentation

## 2013-02-03 DIAGNOSIS — I10 Essential (primary) hypertension: Secondary | ICD-10-CM | POA: Insufficient documentation

## 2013-02-03 DIAGNOSIS — Z79899 Other long term (current) drug therapy: Secondary | ICD-10-CM | POA: Insufficient documentation

## 2013-02-03 DIAGNOSIS — E1169 Type 2 diabetes mellitus with other specified complication: Secondary | ICD-10-CM | POA: Insufficient documentation

## 2013-02-03 LAB — BASIC METABOLIC PANEL
BUN: 10 mg/dL (ref 6–23)
Calcium: 9.1 mg/dL (ref 8.4–10.5)
GFR calc Af Amer: 74 mL/min — ABNORMAL LOW (ref >90)
GFR calc non Af Amer: 64 mL/min — ABNORMAL LOW (ref >90)
Potassium: 3.4 mEq/L — ABNORMAL LOW (ref 3.5–5.1)
Sodium: 137 mEq/L (ref 135–145)

## 2013-02-03 LAB — GLUCOSE, CAPILLARY

## 2013-02-03 LAB — CBC WITH DIFFERENTIAL/PLATELET
Basophils Absolute: 0.1 10*3/uL (ref 0.0–0.1)
Eosinophils Absolute: 0.2 10*3/uL (ref 0.0–0.7)
Lymphocytes Relative: 28 % (ref 12–46)
MCHC: 32.8 g/dL (ref 30.0–36.0)
Monocytes Absolute: 0.7 10*3/uL (ref 0.1–1.0)
Neutro Abs: 5.3 10*3/uL (ref 1.7–7.7)
Neutrophils Relative %: 61 % (ref 43–77)
RDW: 21.2 % — ABNORMAL HIGH (ref 11.5–15.5)

## 2013-02-03 MED ORDER — SODIUM CHLORIDE 0.9 % IV SOLN
1000.0000 mL | Freq: Once | INTRAVENOUS | Status: AC
  Administered 2013-02-04: 1000 mL via INTRAVENOUS

## 2013-02-03 MED ORDER — SODIUM CHLORIDE 0.9 % IV SOLN: 1000.0000 mL | INTRAVENOUS | Status: DC

## 2013-02-03 MED ORDER — SODIUM CHLORIDE 0.9 % IV BOLUS (SEPSIS)
1000.0000 mL | Freq: Once | INTRAVENOUS | Status: AC
  Administered 2013-02-03: 1000 mL via INTRAVENOUS

## 2013-02-03 NOTE — Telephone Encounter (Signed)
Pt's wife called Emergency Line with the concern of pt CBG's at home being elevated (to high to read on meter). She is very poor historian, reports pt has being using Insulin Lantus as indicated since recent hospital discharge but reports they aren't working. She denies pt has nausea, vomiting, pain, or fever but reports that pt is walking "wabbly" and looks very ill.  Recommended pt to activate EMS and bring pt to get evaluated to ED.

## 2013-02-03 NOTE — ED Notes (Signed)
Pt to ED via EMS with c/o hyperglycemia for 2-3 days. Per EMS CBG 501. Pt has ketone breath and confused. Per EMS pt was on 70 units x2 a day insulin and he was hypoglycemic. Insulin changed to 10 units x2 a day and now he is hyperglycemic. Pt's wife states she called the PCP and PCP told the pt to come to ED. Vitals per EMS BP-140/90, O2-98% on room air. IV 18G left A/C insert by ENS.

## 2013-02-03 NOTE — ED Notes (Signed)
Respiratory Therapy paged for ABG

## 2013-02-04 ENCOUNTER — Ambulatory Visit (INDEPENDENT_AMBULATORY_CARE_PROVIDER_SITE_OTHER): Payer: Managed Care, Other (non HMO) | Admitting: Family Medicine

## 2013-02-04 ENCOUNTER — Encounter: Payer: Self-pay | Admitting: Family Medicine

## 2013-02-04 VITALS — BP 159/101 | HR 71 | Ht 70.0 in | Wt 279.4 lb

## 2013-02-04 DIAGNOSIS — R112 Nausea with vomiting, unspecified: Secondary | ICD-10-CM

## 2013-02-04 DIAGNOSIS — E119 Type 2 diabetes mellitus without complications: Secondary | ICD-10-CM

## 2013-02-04 DIAGNOSIS — I10 Essential (primary) hypertension: Secondary | ICD-10-CM

## 2013-02-04 LAB — COMPREHENSIVE METABOLIC PANEL
ALT: 15 U/L (ref 0–53)
AST: 21 U/L (ref 0–37)
Albumin: 3.6 g/dL (ref 3.5–5.2)
Alkaline Phosphatase: 102 U/L (ref 39–117)
Calcium: 8.9 mg/dL (ref 8.4–10.5)
Chloride: 100 mEq/L (ref 96–112)
Potassium: 3.6 mEq/L (ref 3.5–5.3)
Sodium: 140 mEq/L (ref 135–145)

## 2013-02-04 LAB — URINALYSIS, ROUTINE W REFLEX MICROSCOPIC
Bilirubin Urine: NEGATIVE
Nitrite: NEGATIVE
Protein, ur: 30 mg/dL — AB
Specific Gravity, Urine: 1.027 (ref 1.005–1.030)
Urobilinogen, UA: 0.2 mg/dL (ref 0.0–1.0)

## 2013-02-04 LAB — POCT I-STAT 3, ART BLOOD GAS (G3+)
Acid-Base Excess: 4 mmol/L — ABNORMAL HIGH (ref 0.0–2.0)
O2 Saturation: 87 %
Patient temperature: 98.2
TCO2: 32 mmol/L (ref 0–100)
pCO2 arterial: 49.5 mmHg — ABNORMAL HIGH (ref 35.0–45.0)

## 2013-02-04 LAB — GLUCOSE, CAPILLARY

## 2013-02-04 LAB — URINE MICROSCOPIC-ADD ON

## 2013-02-04 MED ORDER — INSULIN GLARGINE 100 UNIT/ML ~~LOC~~ SOLN: 20.0000 [IU] | Freq: Two times a day (BID) | SUBCUTANEOUS | Status: DC

## 2013-02-04 MED ORDER — INSULIN GLARGINE 100 UNIT/ML ~~LOC~~ SOLN: 15.0000 [IU] | Freq: Every day | SUBCUTANEOUS | Status: DC

## 2013-02-04 MED ORDER — METOCLOPRAMIDE HCL 10 MG PO TABS: 10.0000 mg | ORAL_TABLET | Freq: Four times a day (QID) | ORAL | Status: DC

## 2013-02-04 MED ORDER — INSULIN ASPART 100 UNIT/ML ~~LOC~~ SOLN
10.0000 [IU] | Freq: Once | SUBCUTANEOUS | Status: AC
  Administered 2013-02-04: 10 [IU] via SUBCUTANEOUS
  Filled 2013-02-04: qty 1

## 2013-02-04 MED ORDER — INSULIN LISPRO 100 UNIT/ML (KWIKPEN): PEN_INJECTOR | SUBCUTANEOUS | Status: DC

## 2013-02-04 MED ORDER — INSULIN ASPART 100 UNIT/ML FLEXPEN: PEN_INJECTOR | SUBCUTANEOUS | Status: DC

## 2013-02-04 NOTE — Assessment & Plan Note (Addendum)
Pt seems to be a brittle diabetic and has lows reportedly more at nighttime.  Likely little reserve due to alcoholism and presumed hepatic damage. - Decrease lantus to 15 units qAM - Take novolog (insulin aspart) per the following sliding scale (pt states he has this at home so did not re-prescribe). If blood sugar 200-250, use 1 unit. If 251-300, use 2 units. If 301-350, use 3 units. If 351-400, use 4 units. If >400, use 5 units and recheck in 1 hour and redose according to this scale. Call MD at this point. - Check CMET and lipase today to evaluate for pancreatitis along with LFTs and blood glucose - F/u with Dr. Paulla Fore 1 week

## 2013-02-04 NOTE — ED Notes (Signed)
Pt has ride home.

## 2013-02-04 NOTE — ED Notes (Addendum)
CBG 237 MD Otter Notified

## 2013-02-04 NOTE — ED Notes (Signed)
Pt using urinal for urine specimen

## 2013-02-04 NOTE — Progress Notes (Signed)
Subjective:     Patient ID: LEIBISH PAYNTER, male   DOB: October 20, 1965, 47 y.o.   MRN: [id number]  [facility] follow up  HPI   VALDEMAR COMAN is a 47 y.o. male with h/o alcoholism, type II diabetes mellitus, and HTN here for hospital follow-up. He was admitted due to being unresponsive with low blood sugar. During admission, blood pressure was markedly elevated but seemed to respond well to antihypertensives. He was sent home on lantus 10units BID (decreased from previous home dose), losartan 100mg  daily, amlodipine 10mg  daily, and toprol xl 100mg  daily.   1. Diabetes control - Since discharge, Mr. Kuligowski states BG has been very variable and he has had hypoglycemia to 40s-70s with symptoms and hyperglycemia to high 400s. Hypoglycemia occurs mostly at night, and hyperglycemia mostly in the morning. Last night, he called EMS due to very high blood sugars and feeling off-balance. He was sent home from ED on slightly higher lantus 20units BID. Today prior to appointment, blood sugar was 77.   2. Hypertension - Mr Parchman reports systolic blood pressures in 120s at home but here BP is 159/100. He reports taking antihypertensives daily but admits to uncertainty regarding which are his antihypertensives. On med review, it seems he is taking them daily. Does not take lasix. Otherwise, takes all listed medications.   Review of Systems - Mr Shong admits to anxiety and also to difficulty understanding his BP medications. Denies substance use since hospitalization. Reports nausea making it hard to eat but able to tolerate liquids. Denies fevers, dizziness, syncope, diarrhea. Denies abdominal or chest pain. Denies SOB, leg swelling.  PMH, SH, and FH reviewed with no changes. Past Medical History  Diagnosis Date  . Hypertension   . Blood transfusion   . Diabetes mellitus   . Venous insufficiency 12/13/2011  . Cardiac arrest 12/07/2012  . OSA (obstructive sleep apnea) 12/07/2012  . Anxiety   . Acute renal  insufficiency 12/08/2012       Objective:   Physical Exam BP 159/101  Pulse 71  Ht 5\' 10"  (1.778 m)  Wt 279 lb 6.4 oz (126.735 kg)  BMI 40.09 kg/m2  GEN: NAD CV: RRR, no murmurs, rubs, gallops PULM: CTAB, no increased WOB ABD: Obese NEURO: Awake, alert, oriented, no focal deficits, PERRL, sclera clear, EOMI, normal speech EXTR: No LE edema or tenderness    Assessment:     TAHJ EWOLDT is a 47 y.o. male with h/o alcoholism, type II diabetes mellitus, and HTN here for hospital follow-up. He was admitted due to being unresponsive with low blood sugar.    Plan:

## 2013-02-04 NOTE — ED Provider Notes (Signed)
History     CSN: [id number]  Arrival date & time 02/03/13  2214   First MD Initiated Contact with Patient 02/03/13 2301      Chief Complaint  Patient presents with  . Hyperglycemia    (Consider location/radiation/quality/duration/timing/severity/associated sxs/prior treatment) HPI 47 year old male presents to emergency room with complaint of hyperglycemia.  Patient with recent admission, last week for hypoglycemia.  Past medical history significant for hypertension, obesity, diabetes, cardiac arrest, thought to be secondary to obstructive sleep apnea, and acute alcohol intoxication.  Patient reports since his cardiac arrest in March, he has lost a lot of weight.  He had low blood sugars last week, and was admitted and they dropped his Lantus dose to 10 units twice a day.  Today his meter has been reading high.  They were out of batteries for the meter yesterday.  Wife reports that he was not acting right this evening.  Patient reports that he is nauseated most of the time, and has not been eating well.  He denies drinking any alcohol. Past Medical History  Diagnosis Date  . Hypertension   . Blood transfusion   . Diabetes mellitus   . Venous insufficiency 12/13/2011  . Cardiac arrest 12/07/2012  . OSA (obstructive sleep apnea) 12/07/2012  . Anxiety   . Acute renal insufficiency 12/08/2012    Past Surgical History  Procedure Laterality Date  . Knee arthroscopy    . Esophagogastroduodenoscopy Left 11/26/2012    Procedure: ESOPHAGOGASTRODUODENOSCOPY (EGD);  Surgeon: Wonda Horner, MD;  Location: [facility] END[facility]COPY;  Service: Endoscopy;  Laterality: Left;    History reviewed. No pertinent family history.  History  Substance Use Topics  . Smoking status: Never Smoker   . Smokeless tobacco: Not on file  . Alcohol Use: 8.4 oz/week    14 Cans of beer per week     Comment: couple cans day--few beers daily      Review of Systems  All other systems reviewed and are negative.    Allergies   Lisinopril  Home Medications   Current Outpatient Rx  Name  Route  Sig  Dispense  Refill  . amLODipine (NORVASC) 10 MG tablet   Oral   Take 1 tablet (10 mg total) by mouth daily.   30 tablet   4   . aspirin (ADULT ASPIRIN EC LOW STRENGTH) 81 MG EC tablet   Oral   Take 1 tablet (81 mg total) by mouth daily.   30 tablet   0   . furosemide (LASIX) 40 MG tablet   Oral   Take 0.5 tablets (20 mg total) by mouth daily.   90 tablet   3   . losartan (COZAAR) 50 MG tablet   Oral   Take 2 tablets (100 mg total) by mouth daily.   90 tablet   3   . metoprolol succinate (TOPROL-XL) 100 MG 24 hr tablet   Oral   Take 100 mg by mouth daily. [facility] mail order-100mg  daily.         . pravastatin (PRAVACHOL) 80 MG tablet   Oral   Take 1 tablet (80 mg total) by mouth at bedtime. for Cholesterol   90 tablet   3   . sildenafil (VIAGRA) 100 MG tablet   Oral   Take 100 mg by mouth daily as needed for erectile dysfunction. Takes 100mg  up to four hours before intercourse         . traZODone (DESYREL) 50 MG tablet   Oral  Take 50 mg by mouth at bedtime as needed. sleep         . insulin glargine (LANTUS) 100 UNIT/ML injection   Subcutaneous   Inject 0.2 mLs (20 Units total) into the skin 2 (two) times daily.   10 mL   0   . metoCLOPramide (REGLAN) 10 MG tablet   Oral   Take 1 tablet (10 mg total) by mouth every 6 (six) hours.   30 tablet   0     BP 151/91  Pulse 80  Temp(Src) 98.2 F (36.8 C) (Oral)  Resp 21  SpO2 100%  Physical Exam  Nursing note and vitals reviewed. Constitutional: He is oriented to person, place, and time. He appears well-developed and well-nourished.  HENT:  Head: Normocephalic and atraumatic.  Nose: Nose normal.  Mouth/Throat: Oropharynx is clear and moist.  Eyes: Conjunctivae and EOM are normal. Pupils are equal, round, and reactive to light.  Neck: Normal range of motion. Neck supple. No JVD present. No tracheal deviation  present. No thyromegaly present.  Cardiovascular: Normal rate, regular rhythm, normal heart sounds and intact distal pulses.  Exam reveals no gallop and no friction rub.   No murmur heard. Pulmonary/Chest: Effort normal and breath sounds normal. No stridor. No respiratory distress. He has no wheezes. He has no rales. He exhibits no tenderness.  Abdominal: Soft. Bowel sounds are normal. He exhibits no distension and no mass. There is no tenderness. There is no rebound and no guarding.  Musculoskeletal: Normal range of motion. He exhibits no edema and no tenderness.  Lymphadenopathy:    He has no cervical adenopathy.  Neurological: He is alert and oriented to person, place, and time. He has normal reflexes. No cranial nerve deficit. He exhibits normal muscle tone. Coordination normal.  Skin: Skin is warm and dry. No rash noted. No erythema. No pallor.  Psychiatric: He has a normal mood and affect. His behavior is normal. Judgment and thought content normal.    ED Course  Procedures (including critical care time)  Labs Reviewed  GLUCOSE, CAPILLARY - Abnormal; Notable for the following:    Glucose-Capillary 487 (*)    All other components within normal limits  CBC WITH DIFFERENTIAL - Abnormal; Notable for the following:    RBC 3.96 (*)    Hemoglobin 9.8 (*)    HCT 29.9 (*)    MCV 75.5 (*)    MCH 24.7 (*)    RDW 21.2 (*)    Platelets 467 (*)    All other components within normal limits  BASIC METABOLIC PANEL - Abnormal; Notable for the following:    Potassium 3.4 (*)    Chloride 92 (*)    Glucose, Bld 433 (*)    GFR calc non Af Amer 64 (*)    GFR calc Af Amer 74 (*)    All other components within normal limits  URINALYSIS, ROUTINE W REFLEX MICROSCOPIC - Abnormal; Notable for the following:    Glucose, UA >1000 (*)    Hgb urine dipstick TRACE (*)    Protein, ur 30 (*)    All other components within normal limits  GLUCOSE, CAPILLARY - Abnormal; Notable for the following:     Glucose-Capillary 322 (*)    All other components within normal limits  GLUCOSE, CAPILLARY - Abnormal; Notable for the following:    Glucose-Capillary 237 (*)    All other components within normal limits  POCT I-STAT 3, BLOOD GAS (G3+) - Abnormal; Notable for the following:  pCO2 arterial 49.5 (*)    pO2, Arterial 54.0 (*)    Bicarbonate 30.2 (*)    Acid-Base Excess 4.0 (*)    All other components within normal limits  URINE MICROSCOPIC-ADD ON   No results found.   1. Hyperglycemia       MDM  47 year old male with upper glycemia.  His sugar has responded to subcutaneous NovoLog and fluids.  He has followup at the family practice clinic, Department 3:30.  I discussed his case briefly with the on-call family practice resident who agrees with increasing his Lantus to 20 twice a day.  Patient and family have been instructed to carefully monitor his blood sugars and continue to record them.  He is also to keep a food diary.  We'll start him on Reglan given his complaint of persistent nausea for possible gastroparesis.        Kalman Drape, MD 02/04/13 [phone]

## 2013-02-04 NOTE — Patient Instructions (Addendum)
It was good to see you today.  For your blood sugar, reduce lantus to 15 units in the morning every day.  Take insulin aspart (novolog) on a sliding scale. Check your blood sugar four times daily. If blood sugar 200-250, use 1 unit. If 251-300, use 2 units. If 301-350, use 3 units. If 351-400, use 4 units. If >400, use 5 units and recheck in 1 hour and redose according to this scale. Call MD at this point.  Continue taking blood pressure medicines losartan, amlodipine, and metoprolol daily.  I am checking some labs today and will call if abnormal.  Follow up in 1 week with Dr. Paulla Fore to see how your blood sugar and blood pressure are doing.

## 2013-02-04 NOTE — Assessment & Plan Note (Signed)
BP elevated in clinic today, but in hospital responded well to regimen that discharged patient home on. - Continue that regimen (losartan, amlodipine, metoprolol daily). - F/u with Dr. Paulla Fore in 1 week.

## 2013-02-08 ENCOUNTER — Encounter: Payer: Self-pay | Admitting: Family Medicine

## 2013-02-11 ENCOUNTER — Telehealth: Payer: Self-pay | Admitting: Sports Medicine

## 2013-02-11 NOTE — Telephone Encounter (Signed)
Release from work until March 07, 2013 created, signed and placed in to be called.  Gerda Diss, DO [name] Resident - PGY-2 02/11/2013 8:30 PM

## 2013-02-11 NOTE — Telephone Encounter (Signed)
Patients wife is calling because Jeffrey [name] wants an extension to his release from work due to having several doctors appointments and his health.  His next appointment is on 5/20.

## 2013-02-12 NOTE — Telephone Encounter (Signed)
LVM informing patient that I have placed letter up front for pick up.Rockne Coons

## 2013-02-14 ENCOUNTER — Other Ambulatory Visit (HOSPITAL_COMMUNITY): Payer: Self-pay | Admitting: Family Medicine

## 2013-02-15 ENCOUNTER — Institutional Professional Consult (permissible substitution): Payer: BC Managed Care – PPO | Admitting: Pulmonary Disease

## 2013-02-23 ENCOUNTER — Ambulatory Visit: Payer: Managed Care, Other (non HMO) | Admitting: Sports Medicine

## 2013-02-26 ENCOUNTER — Encounter: Payer: Self-pay | Admitting: Cardiovascular Disease

## 2013-02-26 ENCOUNTER — Ambulatory Visit (INDEPENDENT_AMBULATORY_CARE_PROVIDER_SITE_OTHER): Payer: Managed Care, Other (non HMO) | Admitting: Cardiovascular Disease

## 2013-02-26 VITALS — BP 120/73 | HR 86 | Ht 70.0 in | Wt 278.0 lb

## 2013-02-26 DIAGNOSIS — I469 Cardiac arrest, cause unspecified: Secondary | ICD-10-CM

## 2013-02-26 NOTE — Progress Notes (Signed)
HPI:  47 year old gentleman presenting for initial cardiac evaluation.  The patient has a history of type 2 diabetes, hypertension, and hyperlipidemia. He was in his normal state of health in March of this year when he had an out of hospital PEA cardiac arrest. The patient's family noticed after going to bed that he was not breathing. He received CPR and EMS was called. He was resuscitated with epinephrine and CPR. He was placed on hypothermia protocol. He recovered with an anoxic brain injury. His memory has steadily improved but is not yet back to baseline. It was presumed that his arrest was related to untreated sleep apnea.  The patient has not had any cardiac evaluation. He has no history of myocardial infarction, angina, or documented arrhythmia. He has not had an echocardiogram or stress test in the past.  He is sedentary. He denies chest pain, chest pressure, dyspnea, orthopnea, PND, or leg swelling.  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 He does admit to regular alcohol but states that he only drinks one to 2 beers after work. He uses smokeless tobacco but has never been a cigarette smoker. He has not returned to work since his cardiac arrest.  Of note, the patient received 4 units of packed red blood cells a few weeks before his cardiac arrest. At that time he was found to have a bleeding ulcer. He's had no recurrent symptoms of melena, hematochezia, or hematemesis.  Outpatient Encounter Prescriptions as of 02/26/2013  Medication Sig Dispense Refill  . amLODipine (NORVASC) 10 MG tablet Take 1 tablet (10 mg total) by mouth daily.  30 tablet  4  . aspirin (ADULT ASPIRIN EC LOW STRENGTH) 81 MG EC tablet Take 1 tablet (81 mg total) by mouth daily.  30 tablet  0  . furosemide (LASIX) 40 MG tablet Take 0.5 tablets (20 mg total) by mouth daily.  90 tablet  3  . insulin aspart (NOVOLOG FLEXPEN) 100 unit/mL SOLN FlexPen If blood sugar 200-250, use  1 unit. If 251-300, use 2 units. If 301-350, use 3 units. If 351-400, use 4 units. If >400, use 5 units and recheck in 1 hour and redose according to this scale. Call MD at this point.  1 pen  3  . insulin glargine (LANTUS) 100 UNIT/ML injection Inject 0.15 mLs (15 Units total) into the skin daily. In the morning  10 mL  0  . losartan (COZAAR) 50 MG tablet Take 2 tablets (100 mg total) by mouth daily.  90 tablet  3  . metoCLOPramide (REGLAN) 10 MG tablet Take 1 tablet (10 mg total) by mouth every 6 (six) hours.  30 tablet  0  . metoprolol succinate (TOPROL-XL) 100 MG 24 hr tablet TAKE 1 TABLET DAILY  30 tablet  5  . pravastatin (PRAVACHOL) 80 MG tablet Take 1 tablet (80 mg total) by mouth at bedtime. for Cholesterol  90 tablet  3  . sildenafil (VIAGRA) 100 MG tablet Take 100 mg by mouth daily as needed for erectile dysfunction. Takes 100mg  up to four hours before intercourse      . traZODone (DESYREL) 50 MG tablet Take 50 mg by mouth at bedtime as needed. sleep       No facility-administered encounter medications on file as of 02/26/2013.    Lisinopril  Past Medical History  Diagnosis Date  . Hypertension   . Blood transfusion   . Diabetes mellitus   . Venous insufficiency 12/13/2011  .  Cardiac arrest 12/07/2012  . OSA (obstructive sleep apnea) 12/07/2012  . Anxiety   . Acute renal insufficiency 12/08/2012    Past Surgical History  Procedure Laterality Date  . Knee arthroscopy    . Esophagogastroduodenoscopy Left 11/26/2012    Procedure: ESOPHAGOGASTRODUODENOSCOPY (EGD);  Surgeon: Wonda Horner, MD;  Location: [facility] ENDOSCOPY;  Service: Endoscopy;  Laterality: Left;    History   Social History  . Marital Status: Married    Spouse Name: N/A    Number of Children: N/A  . Years of Education: N/A   Occupational History  . Not on file.   Social History Main Topics  . Smoking status: Never Smoker   . Smokeless tobacco: Not on file  . Alcohol Use: 8.4 oz/week    14 Cans of beer per  week     Comment: couple cans day--few beers daily  . Drug Use: No  . Sexually Active: Yes   Other Topics Concern  . Not on file   Social History Narrative  . No narrative on file    Family history: His mother died at age 52 of a stroke. Father has had no cardiac problems. Brothers and sisters have had no cardiac problems. There is no history of coronary artery disease in the family.  ROS:  General: no fevers/chills/night sweats Eyes: no blurry vision, diplopia, or amaurosis ENT: no sore throat or hearing loss Resp: no cough, wheezing, or hemoptysis CV: no edema or palpitations GI: no abdominal pain, nausea, vomiting, diarrhea, or constipation GU: no dysuria, frequency, or hematuria Skin: no rash Neuro: no headache, numbness, tingling, or weakness of extremities Musculoskeletal: Positive for joint pains Heme: no bleeding, DVT, or easy bruising Endo: no polydipsia or polyuria  BP 120/73  Pulse 86  Ht 5\' 10"  (1.778 m)  Wt 126.1 kg (278 lb)  BMI 39.89 kg/m2  SpO2 97%  PHYSICAL EXAM: Pt is alert and oriented, WD, WN, pleasant obese African American male in no distress. HEENT: normal Neck: JVP normal. Carotid upstrokes normal without bruits. No thyromegaly. Lungs: equal expansion, clear bilaterally CV: Apex is discrete and nondisplaced, RRR without murmur or gallop Abd: soft, NT, +BS, no bruit, no hepatosplenomegaly Back: no CVA tenderness Ext: no C/C/E        DP/PT pulses intact and = Skin: warm and dry without rash Neuro: CNII-XII intact             Strength intact = bilaterally  EKG:  Reviewed from 01/24/2013. This shows normal sinus rhythm and is within normal limits.  ASSESSMENT AND PLAN: 1. Unexplained PEA cardiac arrest with successful resuscitation 2. Long-standing type 2 diabetes 3. Hypertension, well controlled on amlodipine, losartan, and metoprolol succinate. 4. Hyperlipidemia, treated with pravastatin.  I agree that a likely cause of PEA arrest in this  gentleman is obstructive sleep apnea. He now is compliant with CPAP on a regular basis. However, I think he clearly warrants cardiac evaluation. He has over 10 years of diabetes and also has a history of hypertension and hyperlipidemia. He is obese. Obstructive CAD and left ventricular dysfunction or in the differential for his cardiac arrest. I have recommended an echocardiogram and exercise Myoview stress test to rule out structural heart disease or ischemic heart disease, respectively. The patient will return for followup after these tests are completed. I appreciate the opportunity to meet this nice gentleman.  Sherren Mocha 02/26/2013 3:09 PM

## 2013-02-26 NOTE — Patient Instructions (Addendum)
Your physician has requested that you have an echocardiogram. Echocardiography is a painless test that uses sound waves to create images of your heart. It provides your doctor with information about the size and shape of your heart and how well your heart's chambers and valves are working. This procedure takes approximately one hour. There are no restrictions for this procedure.  Your physician has requested that you have an exercise stress myoview. For further information please visit [email or web address]. Please follow instruction sheet, as given.  Your physician recommends that you schedule a follow-up appointment in: 3-4 WEEKS with Dr Burt Knack  Your physician recommends that you continue on your current medications as directed. Please refer to the Current Medication list given to you today.

## 2013-03-04 ENCOUNTER — Telehealth: Payer: Self-pay | Admitting: Sports Medicine

## 2013-03-04 ENCOUNTER — Encounter (HOSPITAL_COMMUNITY): Payer: Managed Care, Other (non HMO)

## 2013-03-04 DIAGNOSIS — F1021 Alcohol dependence, in remission: Secondary | ICD-10-CM

## 2013-03-04 DIAGNOSIS — I469 Cardiac arrest, cause unspecified: Secondary | ICD-10-CM

## 2013-03-04 NOTE — Telephone Encounter (Signed)
Wants a letter from dr Paulla Fore stating he can go back Please advise

## 2013-03-04 NOTE — Telephone Encounter (Signed)
Pt has f/u appt with Dr. Paulla Fore 03/11/2013.  Upon further investigation into neuro psych evaluation, [facility] sent a note that they do not do those evals, only neuro evals.  So appt has not been made.  Also, please note, the diagnosis code for your initial neuro psych is DM.  Please change. Thank you.    Arthur Aydelotte, [name], [place]

## 2013-03-04 NOTE — Telephone Encounter (Signed)
Will fwd to MD.  Jazmeen Axtell L, CMA  

## 2013-03-04 NOTE — Telephone Encounter (Signed)
I was planning on discussing this with him at his last appointment but he no showed.  Please call and arrange a time to followup so we can discuss this in person.  I have not received an evaluation from neurology yet and will need that prior to being able to clear him.

## 2013-03-05 NOTE — Telephone Encounter (Signed)
Patient does need to have a neuropsychological evaluation.  It appears this is done through [facility] outpatient rehabilitation.  Provider is name Dr. Antionette Poles, Ph.D.  I have placed an order in this encounter for referral to neuro rehabilitation for evaluation and treatment.  I also canceled his prior neurology referral.  [facility] [address], [place], [place] [address] p: [phone] [email or web address]

## 2013-03-09 ENCOUNTER — Other Ambulatory Visit (HOSPITAL_COMMUNITY): Payer: Managed Care, Other (non HMO)

## 2013-03-11 ENCOUNTER — Ambulatory Visit (INDEPENDENT_AMBULATORY_CARE_PROVIDER_SITE_OTHER): Payer: Managed Care, Other (non HMO) | Admitting: Sports Medicine

## 2013-03-11 ENCOUNTER — Encounter: Payer: Self-pay | Admitting: Sports Medicine

## 2013-03-11 VITALS — BP 116/59 | HR 78 | Temp 99.4°F | Ht 70.0 in | Wt 272.0 lb

## 2013-03-11 DIAGNOSIS — G9340 Encephalopathy, unspecified: Secondary | ICD-10-CM

## 2013-03-11 DIAGNOSIS — K922 Gastrointestinal hemorrhage, unspecified: Secondary | ICD-10-CM

## 2013-03-11 DIAGNOSIS — F1021 Alcohol dependence, in remission: Secondary | ICD-10-CM

## 2013-03-11 DIAGNOSIS — I10 Essential (primary) hypertension: Secondary | ICD-10-CM

## 2013-03-11 DIAGNOSIS — E119 Type 2 diabetes mellitus without complications: Secondary | ICD-10-CM

## 2013-03-11 MED ORDER — TRAZODONE HCL 50 MG PO TABS: 50.0000 mg | ORAL_TABLET | Freq: Every evening | ORAL | Status: DC | PRN

## 2013-03-11 MED ORDER — METOPROLOL SUCCINATE ER 100 MG PO TB24: ORAL_TABLET | ORAL | Status: DC

## 2013-03-11 MED ORDER — PRAVASTATIN SODIUM 80 MG PO TABS: 80.0000 mg | ORAL_TABLET | Freq: Every day | ORAL | Status: DC

## 2013-03-11 MED ORDER — LOSARTAN POTASSIUM 100 MG PO TABS: 100.0000 mg | ORAL_TABLET | Freq: Every day | ORAL | Status: DC

## 2013-03-11 MED ORDER — AMLODIPINE BESYLATE 10 MG PO TABS: 10.0000 mg | ORAL_TABLET | Freq: Every day | ORAL | Status: DC

## 2013-03-11 MED ORDER — FUROSEMIDE 20 MG PO TABS: 20.0000 mg | ORAL_TABLET | Freq: Every day | ORAL | Status: DC

## 2013-03-11 MED ORDER — PANTOPRAZOLE SODIUM 40 MG PO TBEC: 40.0000 mg | DELAYED_RELEASE_TABLET | Freq: Every day | ORAL | Status: DC

## 2013-03-11 MED ORDER — INSULIN GLARGINE 100 UNIT/ML ~~LOC~~ SOLN: 30.0000 [IU] | Freq: Two times a day (BID) | SUBCUTANEOUS | Status: DC

## 2013-03-11 MED ORDER — INSULIN ASPART 100 UNIT/ML FLEXPEN: PEN_INJECTOR | SUBCUTANEOUS | Status: DC

## 2013-03-11 NOTE — Assessment & Plan Note (Addendum)
Very poorly controlled.  Most likely due to  Continued to heavy alcohol use.  1 episode of hypoglycemia.  Patient has self titrated his Lantus.  I want him to continue 30 units of Lantus twice a day.  I'm going to add 5 units of NovoLog onto his sliding scale.  Followup in one month

## 2013-03-11 NOTE — Assessment & Plan Note (Signed)
Still continuing to use.  Pre-contemplative.  We'll continue to have issues with sleep and hypertension and diabetes management with this.  Likely having some alcoholic gastritis will start PPI

## 2013-03-11 NOTE — Assessment & Plan Note (Addendum)
Stable/Well Controlled - no changes at this time. Refills today

## 2013-03-11 NOTE — Assessment & Plan Note (Signed)
Unclear as to how his PPI fell off.  Will restart given high likelihood of alcoholic gastritis.  D/c reglan

## 2013-03-11 NOTE — Progress Notes (Signed)
  [place]  Patient name: MASAMI SAMSON MRN [id number]  Date of birth: 1965/11/28  CC & HPI:  EVERET BATTERTON is a 47 y.o. male presenting today for follow up of:  #  Hypertension - chronic problem, well controlled.    no orthostasis  no chest pain, no dyspnea on exertion, no orthopnea/PND, no peripheral edema,   no episodes of unilateral weakness, dysarthria or acute visual changes  #  Diabetes - chronic problem, poorly controlled.   Blood Sugar checks/ranges: 80-300s   occasionally hypoglycemic symptoms/episodes; usually associated with skipping meals.  no poluria, no polydipsia,  no new visual problems  #  OSA: Reports good compliance with his CPAP.   #  EtOH Abuse: Reports continues to drink "maybe 2 X 24oz" drinks per day.  Has very poor insight to his overall health status regarding his influence of alcohol  #  Metabolic encephalopathy: Patient continues to have poor insight and has symptoms concerning for profound cognitive deficit following his PEA arrest.  He has been unable to followup for neuropsychological evaluation due to loss to follow up and confusion with scheduling.  The patient no showed an appointment last month that he was unaware that he had.   #  Heart Burn: Patient reports she has been having some epigastric discomfort.  He is scheduled for a cardiac evaluation later this month.  He also has not been taking a PPI in spite of history of alcoholic gastritis and insetting of current alcohol use.  Medications started last visit were not helpful  ------------------------------------------------------------------------------------------------------------------ Medication Compliance: probably noncompliant though I cannot elicit that specific history  Diet Compliance: probably noncompliant though I cannot elicit that specific history    ROS:  PER HPI  Pertinent History Reviewed:  Medical & Surgical Hx:  Reviewed: Significant for poorly  controlled, DM, improved BP control.  Prior medical non-compliace with medications that seems to be improved Medications: Reviewed & Updated - See associated section in EMR Social History: Reviewed -  reports that he has never smoked. He does not have any smokeless tobacco history on file.   Objective Findings:  Vitals: BP 116/59  Pulse 78  Temp(Src) 99.4 F (37.4 C) (Oral)  Ht 5\' 10"  (1.778 m)  Wt 272 lb (123.378 kg)  BMI 39.03 kg/m2  PE: GENERAL:  Adult AA  male. In no discomfort; no respiratory distress.  Smells of alcohol.   PSYCH: Alert and appropriately interactive; Insight:Lacking  Poor recall from our last visit.  Denies depression but reports being fed up being at home and only wants to return to work H&N: AT/[place], trachea midline EENT:  MMM, no scleral icterus, EOMi HEART: RRR, S1/S2 heard, no murmur LUNGS: CTA B, no wheezes, no crackles EXTREMITIES: Moves all 4 extremities spontaneously, warm well perfused, no edema, bilateral DP and PT pulses 2/4.     Assessment & Plan:

## 2013-03-11 NOTE — Patient Instructions (Addendum)
It was nice to see you today.   Today we discussed: 1. DIABETES MELLITUS II, UNCOMPLICATED Keep your lantus @ 30 units twice a day Add 5 Units to your sliding scale  2. HYPERTENSION, BENIGN SYSTEMIC Keep up the good work  3. ALCOHOL ABUSE, HX OF Think about quitting there are resources to help  4. GI bleed Stop your Metoclopramide Start: - pantoprazole (PROTONIX) 40 MG tablet; Take 1 tablet (40 mg total) by mouth daily.  Dispense: 90 tablet; Refill: 3   Please plan to return to see me in 1 month.  If you need anything prior to seeing me please call the clinic.  Please Bring all medications with you to each appointment.

## 2013-03-11 NOTE — Assessment & Plan Note (Signed)
Pending neuropsychological evaluation.  Patient has been established for meeting on 6-23.  Will need to be out of work until further evaluated due to the safety concerns with machinery.

## 2013-03-12 ENCOUNTER — Telehealth: Payer: Self-pay | Admitting: Sports Medicine

## 2013-03-12 NOTE — Telephone Encounter (Addendum)
Jeffrey [name] calling about the papers Jeffrey [name] brought in at his visit for completion to give his employer. He need them back or faxed to them so he can continue receiving his paycheck while he's out on medical leave.  Please call her back asap to inform when documents will be ready.      Jeffrey [name] called back and said her husband wanted you to write a note stating when he will be able to go back to work.  He will take this back to his employer for his file.

## 2013-03-15 NOTE — Telephone Encounter (Signed)
Paperwork being completed today the patient does need further evaluation by neuropsych before full release back to work.  Forms will be faxed tomorrow, please call and inform

## 2013-03-15 NOTE — Telephone Encounter (Signed)
Will fwd to Md.  [name], [name], [name]

## 2013-03-15 NOTE — Telephone Encounter (Signed)
Patients wife is calling back to check on the status of the paperwork for his employer.

## 2013-03-16 ENCOUNTER — Encounter: Payer: Self-pay | Admitting: Cardiovascular Disease

## 2013-03-16 ENCOUNTER — Ambulatory Visit (INDEPENDENT_AMBULATORY_CARE_PROVIDER_SITE_OTHER): Payer: Managed Care, Other (non HMO) | Admitting: Cardiovascular Disease

## 2013-03-16 ENCOUNTER — Other Ambulatory Visit: Payer: Self-pay

## 2013-03-16 ENCOUNTER — Ambulatory Visit: Payer: Managed Care, Other (non HMO) | Admitting: Cardiovascular Disease

## 2013-03-16 DIAGNOSIS — Z794 Long term (current) use of insulin: Secondary | ICD-10-CM

## 2013-03-16 DIAGNOSIS — I469 Cardiac arrest, cause unspecified: Secondary | ICD-10-CM

## 2013-03-16 DIAGNOSIS — E119 Type 2 diabetes mellitus without complications: Secondary | ICD-10-CM

## 2013-03-16 HISTORY — PX: CARDIOVASCULAR STRESS TEST: SHX262

## 2013-03-16 NOTE — Progress Notes (Signed)
Exercise Treadmill Test  Pre-Exercise Testing Evaluation Rhythm: normal sinus  Rate: 77     Test  Exercise Tolerance Test Ordering MD: Sherren Mocha, MD  Interpreting MD: Sherren Mocha, MD  Unique Test No: 1  Treadmill:  1  Indication for ETT: post Cardiac Arrest  Contraindication to ETT: No   Stress Modality: exercise - treadmill  Cardiac Imaging Performed: non   Protocol: standard Bruce - maximal  Max BP:  135/59  Max MPHR (bpm):  172 85% MPR (bpm):  146  MPHR obtained (bpm):  129 % MPHR obtained:  75  Reached 85% MPHR (min:sec):  Did not reach Total Exercise Time (min-sec):  5:00  Workload in METS:  5.1 Borg Scale: 15  Reason ETT Terminated:  fatigue    ST Segment Analysis At Rest: normal ST segments - no evidence of significant ST depression With Exercise: no evidence of significant ST depression  Other Information Arrhythmia:  No Angina during ETT:  absent (0) Quality of ETT:  non-diagnostic  ETT Interpretation:  normal - no evidence of ischemia by ST analysis  Comments: Very poor exercise tolerance. No significant ST change with low-level exertion. No arrhythmia. Non-diagnostic study because of inability to reach target heart rate.  Recommendations: Graded exercise program. Needs resting 2D echo. Will see back in follow-up after his echo.

## 2013-03-16 NOTE — Telephone Encounter (Signed)
Called patient and told him paperwork is ready.  He wants to come pick these up.  Told pt they will be at front desk.  Patient has appt with Neuropsych on 03/29/2013.  Pt needs a letter stating he will be out of work until that appt and then the follow up appt with Dr. Paulla Fore.  F/u appt with Dr. Paulla Fore has not been scheduled yet.  Will call patient and do so today.  Samyah Bilbo, [name], [place]

## 2013-03-16 NOTE — Telephone Encounter (Signed)
Please call patient concerning paperwork. States he needs a paper for his job and for disability.

## 2013-03-17 ENCOUNTER — Encounter (HOSPITAL_COMMUNITY): Payer: Self-pay | Admitting: Emergency Medicine

## 2013-03-17 ENCOUNTER — Emergency Department (HOSPITAL_COMMUNITY): Payer: Managed Care, Other (non HMO)

## 2013-03-17 ENCOUNTER — Encounter: Payer: Self-pay | Admitting: Cardiovascular Disease

## 2013-03-17 ENCOUNTER — Emergency Department (HOSPITAL_COMMUNITY)
Admission: EM | Admit: 2013-03-17 | Discharge: 2013-03-17 | Disposition: A | Discharge status: Home / Self Care | Payer: Managed Care, Other (non HMO) | Attending: Emergency Medicine | Admitting: Emergency Medicine

## 2013-03-17 DIAGNOSIS — Y9289 Other specified places as the place of occurrence of the external cause: Secondary | ICD-10-CM | POA: Insufficient documentation

## 2013-03-17 DIAGNOSIS — Z87448 Personal history of other diseases of urinary system: Secondary | ICD-10-CM | POA: Insufficient documentation

## 2013-03-17 DIAGNOSIS — Z8674 Personal history of sudden cardiac arrest: Secondary | ICD-10-CM | POA: Insufficient documentation

## 2013-03-17 DIAGNOSIS — Z794 Long term (current) use of insulin: Secondary | ICD-10-CM | POA: Insufficient documentation

## 2013-03-17 DIAGNOSIS — F411 Generalized anxiety disorder: Secondary | ICD-10-CM | POA: Insufficient documentation

## 2013-03-17 DIAGNOSIS — K029 Dental caries, unspecified: Secondary | ICD-10-CM | POA: Insufficient documentation

## 2013-03-17 DIAGNOSIS — R42 Dizziness and giddiness: Secondary | ICD-10-CM | POA: Insufficient documentation

## 2013-03-17 DIAGNOSIS — K08119 Complete loss of teeth due to trauma, unspecified class: Secondary | ICD-10-CM

## 2013-03-17 DIAGNOSIS — Y9389 Activity, other specified: Secondary | ICD-10-CM | POA: Insufficient documentation

## 2013-03-17 DIAGNOSIS — G4733 Obstructive sleep apnea (adult) (pediatric): Secondary | ICD-10-CM | POA: Insufficient documentation

## 2013-03-17 DIAGNOSIS — K1370 Unspecified lesions of oral mucosa: Secondary | ICD-10-CM | POA: Insufficient documentation

## 2013-03-17 DIAGNOSIS — Z79899 Other long term (current) drug therapy: Secondary | ICD-10-CM | POA: Insufficient documentation

## 2013-03-17 DIAGNOSIS — S025XXA Fracture of tooth (traumatic), initial encounter for closed fracture: Secondary | ICD-10-CM | POA: Insufficient documentation

## 2013-03-17 DIAGNOSIS — K068 Other specified disorders of gingiva and edentulous alveolar ridge: Secondary | ICD-10-CM

## 2013-03-17 DIAGNOSIS — R296 Repeated falls: Secondary | ICD-10-CM | POA: Insufficient documentation

## 2013-03-17 DIAGNOSIS — I10 Essential (primary) hypertension: Secondary | ICD-10-CM | POA: Insufficient documentation

## 2013-03-17 DIAGNOSIS — K08109 Complete loss of teeth, unspecified cause, unspecified class: Secondary | ICD-10-CM | POA: Insufficient documentation

## 2013-03-17 DIAGNOSIS — Z8679 Personal history of other diseases of the circulatory system: Secondary | ICD-10-CM | POA: Insufficient documentation

## 2013-03-17 DIAGNOSIS — E119 Type 2 diabetes mellitus without complications: Secondary | ICD-10-CM | POA: Insufficient documentation

## 2013-03-17 MED ORDER — PENICILLIN V POTASSIUM 500 MG PO TABS: 500.0000 mg | ORAL_TABLET | Freq: Four times a day (QID) | ORAL | Status: DC

## 2013-03-17 NOTE — ED Notes (Addendum)
PT. REPORTS RIGHT UPPER MOLAR LOOSE TOOTH CAME OFF THIS AFTERNOON WITH BLEEDING , FELT DIZZY AND FELL AT TRIAGE , ASSISTED BACK TO WHEELCHAIR  , ALERT AND ORIENTED , DENIES PAIN .

## 2013-03-17 NOTE — ED Provider Notes (Signed)
History     CSN: [id number]  Arrival date & time 03/17/13  1906   First MD Initiated Contact with Patient 03/17/13 2030      Chief Complaint  Patient presents with  . Dental Injury    (Consider location/radiation/quality/duration/timing/severity/associated sxs/prior treatment) The history is provided by the patient and medical records. No language interpreter was used.    Jeffrey [name] is a 47 y.o. male  with a hx of HTN, DM, cardiac arrest (December 06, 2012) presents to the Emergency Department complaining of acute, persistent, bleeding from the upper right rear molar after it fell out at 11am.  He has been unable to stop the bleeding. Associated symptoms include lightheadedness earlier today which has resolved since then.  Pt has tried packing the wound but nothing makes it better or worse.  Pt denies fever, chills, HA, chest pain, SOB, nausea, vomiting, diarrhea.  Pt states the sleep apnea caused the cardiac arrest and that the paramedics knocked his teeth loose when they intubated then.  He states he has a hx of bleeding ulcer requiring a blood transfusion 4 pints of blood (February -  1 week before the cardiac arrest).  Pt states his teeth have been loose since that time and the right upper molar fell out without prompting this AM.       Past Medical History  Diagnosis Date  . Hypertension   . Blood transfusion   . Diabetes mellitus   . Venous insufficiency 12/13/2011  . Cardiac arrest 12/07/2012  . OSA (obstructive sleep apnea) 12/07/2012  . Anxiety   . Acute renal insufficiency 12/08/2012    Past Surgical History  Procedure Laterality Date  . Knee arthroscopy    . Esophagogastroduodenoscopy Left 11/26/2012    Procedure: ESOPHAGOGASTRODUODENOSCOPY (EGD);  Surgeon: Wonda Horner, MD;  Location: [facility] ENDOSCOPY;  Service: Endoscopy;  Laterality: Left;    No family history on file.  History  Substance Use Topics  . Smoking status: Never Smoker   . Smokeless tobacco: Not on file   . Alcohol Use: 8.4 oz/week    14 Cans of beer per week     Comment: couple cans day--few beers daily      Review of Systems  Constitutional: Negative for fever, chills and appetite change.  HENT: Positive for mouth sores and dental problem. Negative for ear pain, nosebleeds, facial swelling, rhinorrhea, drooling, trouble swallowing, neck pain, neck stiffness and postnasal drip.   Eyes: Negative for pain and redness.  Respiratory: Negative for cough and wheezing.   Cardiovascular: Negative for chest pain.  Gastrointestinal: Negative for nausea, vomiting and abdominal pain.  Skin: Negative for color change and rash.  Neurological: Negative for weakness and light-headedness.  All other systems reviewed and are negative.    Allergies  Lisinopril  Home Medications   Current Outpatient Rx  Name  Route  Sig  Dispense  Refill  . amLODipine (NORVASC) 10 MG tablet   Oral   Take 1 tablet (10 mg total) by mouth daily.   90 tablet   3   . aspirin (ADULT ASPIRIN EC LOW STRENGTH) 81 MG EC tablet   Oral   Take 1 tablet (81 mg total) by mouth daily.   30 tablet   0   . furosemide (LASIX) 20 MG tablet   Oral   Take 1 tablet (20 mg total) by mouth daily.   90 tablet   3   . insulin aspart (NOVOLOG FLEXPEN) 100 unit/mL SOLN FlexPen  Subcutaneous   Inject 5-10 Units into the skin 3 (three) times daily with meals. 5 units then uses sliding scale; 200-250=1unit; 251-300=2units; 301-350=3units; 351-400=4units; >400=5units and call doctor         . insulin glargine (LANTUS) 100 UNIT/ML injection   Subcutaneous   Inject 0.3 mLs (30 Units total) into the skin 2 (two) times daily. In the morning   10 mL   0   . losartan (COZAAR) 100 MG tablet   Oral   Take 1 tablet (100 mg total) by mouth daily.   90 tablet   3   . metoprolol succinate (TOPROL-XL) 100 MG 24 hr tablet   Oral   Take 100 mg by mouth daily. Take with or immediately following a meal.         . pantoprazole  (PROTONIX) 40 MG tablet   Oral   Take 1 tablet (40 mg total) by mouth daily.   90 tablet   3   . pravastatin (PRAVACHOL) 80 MG tablet   Oral   Take 1 tablet (80 mg total) by mouth at bedtime. for Cholesterol   90 tablet   3   . sildenafil (VIAGRA) 100 MG tablet   Oral   Take 100 mg by mouth daily as needed for erectile dysfunction. Takes 100mg  up to four hours before intercourse         . traZODone (DESYREL) 50 MG tablet   Oral   Take 1 tablet (50 mg total) by mouth at bedtime as needed. sleep   90 tablet   1   . penicillin v potassium (VEETID) 500 MG tablet   Oral   Take 1 tablet (500 mg total) by mouth 4 (four) times daily.   40 tablet   0     BP 137/91  Pulse 64  Temp(Src) 97.6 F (36.4 C) (Oral)  Resp 16  SpO2 95%  Physical Exam  Nursing note and vitals reviewed. Constitutional: He appears well-developed and well-nourished.  HENT:  Head: Normocephalic.  Right Ear: Tympanic membrane, external ear and ear canal normal.  Left Ear: Tympanic membrane, external ear and ear canal normal.  Nose: Nose normal. Right sinus exhibits no maxillary sinus tenderness and no frontal sinus tenderness. Left sinus exhibits no maxillary sinus tenderness and no frontal sinus tenderness.  Mouth/Throat: Uvula is midline, oropharynx is clear and moist and mucous membranes are normal. No oral lesions. Abnormal dentition. Dental caries present. No edematous or lacerations. No oropharyngeal exudate, posterior oropharyngeal edema, posterior oropharyngeal erythema or tonsillar abscesses.    Right upper molar socket bleeding with missing tooth Multiple missing teeth and dental caries throughout  Eyes: Conjunctivae are normal. Pupils are equal, round, and reactive to light. Right eye exhibits no discharge. Left eye exhibits no discharge.  Neck: Normal range of motion. Neck supple.  Cardiovascular: Normal rate, regular rhythm and normal heart sounds.   Pulmonary/Chest: Effort normal and  breath sounds normal. No respiratory distress. He has no wheezes.  Abdominal: Soft. Bowel sounds are normal. He exhibits no distension. There is no tenderness.  Lymphadenopathy:    He has no cervical adenopathy.  Neurological: He is alert.  Skin: Skin is warm and dry.  Psychiatric: He has a normal mood and affect.    ED Course  Procedures (including critical care time)  Labs Reviewed  GLUCOSE, CAPILLARY - Abnormal; Notable for the following:    Glucose-Capillary 265 (*)    All other components within normal limits   Dg Orthopantogram  03/17/2013   *  RADIOLOGY REPORT*  Clinical Data: Dental injury with accidental removal of tooth and associated bleeding.  ORTHOPANTOGRAM/PANORAMIC  Comparison: None.  Findings: A right upper bicuspid shows periapical lucency.  This is likely tooth #5.  No other dental findings.  The mandible and maxilla are otherwise unremarkable in appearance.  IMPRESSION: Dental disease with periapical lucency at the level of tooth #5.   Original Report Authenticated By: Aletta Edouard, M.D.     1. Accidental tooth loss, unspecified edentulism   2. Mouth lesion   3. Bleeding gums       MDM  BRIA HUR presents after tooth loss with persistently bleeding gums.  Pt with several missing teeth, multiple caries and a bleeding socket in the upper right rear molar.  Pt treated with lidocaine with epi soaked gauze which stopped the bleeding.  No signs of infection or gross abscess.  Exam unconcerning for Ludwig's angina or spread of infection.  Will treat with penicillin to prevent infection of the socket.  Return cautions discussed and further home treatment as needed. Urged patient to follow-up with dentist.  I have also discussed reasons to return immediately to the ER.  Patient expresses understanding and agrees with plan.         Jarrett Soho Niya Behler, PA-C 03/17/13 [place], PA-C 03/17/13 2259

## 2013-03-18 ENCOUNTER — Encounter: Payer: Self-pay | Admitting: Sports Medicine

## 2013-03-18 NOTE — ED Provider Notes (Signed)
  Medical screening examination/treatment/procedure(s) were performed by non-physician practitioner and as supervising physician I was immediately available for consultation/collaboration.  On my exam the patient was in no distress. There was, however, an actively bleeding socket. We successfully achieved hemostasis with a epi-soaked gauze packing.     Carmin Muskrat, MD 03/18/13 0003

## 2013-03-23 ENCOUNTER — Ambulatory Visit (HOSPITAL_COMMUNITY): Payer: Managed Care, Other (non HMO) | Attending: Cardiovascular Disease

## 2013-03-23 ENCOUNTER — Encounter: Payer: Self-pay | Admitting: Cardiovascular Disease

## 2013-03-23 DIAGNOSIS — G4733 Obstructive sleep apnea (adult) (pediatric): Secondary | ICD-10-CM | POA: Insufficient documentation

## 2013-03-23 DIAGNOSIS — I10 Essential (primary) hypertension: Secondary | ICD-10-CM | POA: Insufficient documentation

## 2013-03-23 DIAGNOSIS — I469 Cardiac arrest, cause unspecified: Secondary | ICD-10-CM

## 2013-03-23 DIAGNOSIS — E785 Hyperlipidemia, unspecified: Secondary | ICD-10-CM | POA: Insufficient documentation

## 2013-03-23 DIAGNOSIS — E119 Type 2 diabetes mellitus without complications: Secondary | ICD-10-CM | POA: Insufficient documentation

## 2013-03-23 DIAGNOSIS — E669 Obesity, unspecified: Secondary | ICD-10-CM | POA: Insufficient documentation

## 2013-03-23 DIAGNOSIS — Z8674 Personal history of sudden cardiac arrest: Secondary | ICD-10-CM | POA: Insufficient documentation

## 2013-03-23 NOTE — Progress Notes (Signed)
Echocardiogram performed.  

## 2013-03-25 ENCOUNTER — Encounter: Payer: Managed Care, Other (non HMO) | Admitting: Physician Assistant

## 2013-03-30 DIAGNOSIS — G9340 Encephalopathy, unspecified: Secondary | ICD-10-CM

## 2013-03-30 DIAGNOSIS — F09 Unspecified mental disorder due to known physiological condition: Secondary | ICD-10-CM

## 2013-03-30 DIAGNOSIS — I469 Cardiac arrest, cause unspecified: Secondary | ICD-10-CM

## 2013-04-05 ENCOUNTER — Encounter: Payer: Self-pay | Admitting: Sports Medicine

## 2013-04-07 ENCOUNTER — Encounter: Payer: Self-pay | Admitting: Sports Medicine

## 2013-04-07 ENCOUNTER — Ambulatory Visit (INDEPENDENT_AMBULATORY_CARE_PROVIDER_SITE_OTHER): Payer: Managed Care, Other (non HMO) | Admitting: Sports Medicine

## 2013-04-07 VITALS — BP 136/77 | HR 70 | Temp 98.9°F | Ht 70.0 in | Wt 273.0 lb

## 2013-04-07 DIAGNOSIS — F1021 Alcohol dependence, in remission: Secondary | ICD-10-CM

## 2013-04-07 DIAGNOSIS — E119 Type 2 diabetes mellitus without complications: Secondary | ICD-10-CM

## 2013-04-07 DIAGNOSIS — I10 Essential (primary) hypertension: Secondary | ICD-10-CM

## 2013-04-07 DIAGNOSIS — Z9989 Dependence on other enabling machines and devices: Secondary | ICD-10-CM

## 2013-04-07 DIAGNOSIS — G4733 Obstructive sleep apnea (adult) (pediatric): Secondary | ICD-10-CM

## 2013-04-07 DIAGNOSIS — G931 Anoxic brain damage, not elsewhere classified: Secondary | ICD-10-CM

## 2013-04-07 DIAGNOSIS — K922 Gastrointestinal hemorrhage, unspecified: Secondary | ICD-10-CM

## 2013-04-11 ENCOUNTER — Other Ambulatory Visit: Payer: Self-pay | Admitting: Sports Medicine

## 2013-04-12 MED ORDER — FUROSEMIDE 20 MG PO TABS: 20.0000 mg | ORAL_TABLET | Freq: Every day | ORAL | Status: DC

## 2013-04-12 MED ORDER — PANTOPRAZOLE SODIUM 40 MG PO TBEC: 40.0000 mg | DELAYED_RELEASE_TABLET | Freq: Every day | ORAL | Status: DC

## 2013-04-12 MED ORDER — INSULIN GLARGINE 100 UNIT/ML ~~LOC~~ SOLN: 30.0000 [IU] | Freq: Two times a day (BID) | SUBCUTANEOUS | Status: DC

## 2013-04-12 MED ORDER — INSULIN LISPRO 100 UNIT/ML CARTRIDGE: 5.0000 [IU] | Freq: Three times a day (TID) | SUBCUTANEOUS | Status: DC

## 2013-04-12 MED ORDER — METOPROLOL SUCCINATE ER 100 MG PO TB24: 100.0000 mg | ORAL_TABLET | Freq: Every day | ORAL | Status: DC

## 2013-04-12 MED ORDER — TRAZODONE HCL 50 MG PO TABS: 50.0000 mg | ORAL_TABLET | Freq: Every evening | ORAL | Status: DC | PRN

## 2013-04-12 MED ORDER — PRAVASTATIN SODIUM 80 MG PO TABS: 80.0000 mg | ORAL_TABLET | Freq: Every day | ORAL | Status: DC

## 2013-04-12 MED ORDER — AMLODIPINE BESYLATE 10 MG PO TABS: 10.0000 mg | ORAL_TABLET | Freq: Every day | ORAL | Status: DC

## 2013-04-12 MED ORDER — LOSARTAN POTASSIUM 100 MG PO TABS: 100.0000 mg | ORAL_TABLET | Freq: Every day | ORAL | Status: DC

## 2013-04-12 NOTE — Assessment & Plan Note (Signed)
Refilled meds. Change to Humalog given formulary change

## 2013-04-12 NOTE — Assessment & Plan Note (Signed)
Continues to report good compliace

## 2013-04-12 NOTE — Progress Notes (Signed)
  [place]  Patient name: Jeffrey [name] MRN [id number]  Date of birth: [birth year]  CC & HPI:  Jeffrey [name] is a 47 y.o. male presenting today for follow up of:  # Anoxic Brain Injury following PEA arrest: not cleared by neuropsych eval.  Please see overview and scanned media.  #  Hypertension - chronic problem, adequately controlled.     no orthostasis,   no peripheral edema  no chest pain, no dyspnea on exertion, no orthopnea/PND  no episodes of unilateral weakness, dysarthria or acute visual changes  #  Diabetes - chronic problem, poorly controlled.   Blood Sugar checks/ranges: not checking often   no hypoglycemic symptoms/episodes.   no poluria, no polydipsia,  no new visual problems,  LE dysesthesias: None  self foot checks performed: Rarely  # Alcohol Dependence/Abuse:  Continues to drink.  Now heavier than prior to PEA arrest due to being out of work.  Reports heavy daily use previously.  Knows that he can quit but has tried in the past and afraid of the withdrawal symptoms.    ------------------------------------------------------------------------------------------------------------------ Medication Compliance: compliant all of the time  Diet Compliance: compliant most of the time   ROS:  PER HPI  Pertinent History Reviewed:  Medical & Surgical Hx:  Reviewed: Significant for hx of GI bleed, sleep/wake disorder, OSA on CPAP Medications: Reviewed & Updated - See associated section in EMR Social History: Reviewed -  reports that he has never smoked. He does not have any smokeless tobacco history on file.   Objective Findings:  Vitals: BP 136/77  Pulse 70  Temp(Src) 98.9 F (37.2 C) (Oral)  Ht 5\' 10"  (1.778 m)  Wt 273 lb (123.832 kg)  BMI 39.17 kg/m2  PE: GENERAL:  Adult Obese AA  male. In no discomfort; no respiratory distress. PSYCH: Alert and appropriately interactive; Insight:Fair   Seems to have improved mental clarity.  Does not  appear intoxicated today.  Insight seems to be improving.  Recognizes he has a problem with alcohol.  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     Assessment & Plan:

## 2013-04-12 NOTE — Assessment & Plan Note (Signed)
Discussed out of work with pt. Refer to SLP for [facility] >50% of this 25 minute visit spent indirect patient counseling and/or coordination of care.

## 2013-04-12 NOTE — Assessment & Plan Note (Addendum)
Continues to use; reports long term heavy daily use.  Reports now recognizing that it is getting in the way of his medical care and abiilty to work.  Complicating his return of cognitive functioning as well as his other multiple medical problems including DM, HTN, liver disease with GI bleeding Reports has tried quitting but is afraid of withdrawal. Spent extensive time in counseling with pt regarding options. Will have Norma touch base regarding community resources; currently contemplative but not ready to quit due to withdrawal concerns.  Would be appropriate for residential detox if agreeable. - Discussed that he needs medical supervision for withdrawal program given withdrawal symptoms demonstrated in the past. >50% of this 25 minute visit spent indirect patient counseling and/or coordination of care.

## 2013-04-12 NOTE — Assessment & Plan Note (Signed)
Improved BP today likely due to improved compliance

## 2013-04-14 ENCOUNTER — Telehealth: Payer: Self-pay | Admitting: Sports Medicine

## 2013-04-14 NOTE — Telephone Encounter (Signed)
Pt is calling to see when the appointment with [facility] is going to be? JW

## 2013-04-15 NOTE — Telephone Encounter (Signed)
Left message on voicemail,that he's needing to call neuro rehab to find out status.Jeffrey [name], Jeffrey [name]

## 2013-05-03 ENCOUNTER — Telehealth: Payer: Self-pay | Admitting: Sports Medicine

## 2013-05-03 NOTE — Telephone Encounter (Signed)
Resubmitted SLP referal. Please ensure routed to [facility]. Recommended per Dr. Valentina Shaggy and order previously put in but [facility] reportedly does not have records of this.  Please call pt with information once referall completed

## 2013-05-03 NOTE — Telephone Encounter (Signed)
Pt is requesting a referral for Neuro, he called to make the appointment but that office told him that Dr. Paulla Fore had to do the referral before they could see him. JW

## 2013-05-04 ENCOUNTER — Other Ambulatory Visit: Payer: Self-pay | Admitting: Sports Medicine

## 2013-05-04 ENCOUNTER — Telehealth: Payer: Self-pay | Admitting: Sports Medicine

## 2013-05-04 NOTE — Telephone Encounter (Signed)
Pt is requesting that the nurse or Dr. Paulla Fore call him concerning his prescriptions. He gets them from a mail order service and the amount that Dr. Paulla Fore gives them is in correct and he doesn't get enough medication for 90 days. JW

## 2013-05-05 NOTE — Telephone Encounter (Signed)
Patient was very difficult to understand, very slurred speech, possibly from drinking.  He was calling because he is out of his Metoprolol and said that Dr. Paulla Fore was to have called in a 90 day supply earlier in the month.  I let him know that this was done on 7/7.  He mentioned that he is supposed to take 2 a day and I let him know that the instructions say that he is to take 1 daily.  He said he will call [facility] because they have only sent him 30 pills.

## 2013-05-05 NOTE — Telephone Encounter (Signed)
LVM with pt to call and inform us what Rx he needs.   All meds were sent in for 90 day supplies previously.  Please get list of medications that need new Rx if pt calls back

## 2013-05-12 ENCOUNTER — Ambulatory Visit: Payer: Managed Care, Other (non HMO) | Attending: Sports Medicine

## 2013-05-12 DIAGNOSIS — R41841 Cognitive communication deficit: Secondary | ICD-10-CM | POA: Insufficient documentation

## 2013-05-12 DIAGNOSIS — IMO0001 Reserved for inherently not codable concepts without codable children: Secondary | ICD-10-CM | POA: Insufficient documentation

## 2013-05-13 ENCOUNTER — Encounter: Payer: Self-pay | Admitting: Cardiovascular Disease

## 2013-05-19 ENCOUNTER — Ambulatory Visit: Payer: Managed Care, Other (non HMO) | Admitting: Speech Pathology

## 2013-05-26 ENCOUNTER — Encounter: Payer: Self-pay | Admitting: Sports Medicine

## 2013-05-26 ENCOUNTER — Inpatient Hospital Stay (HOSPITAL_COMMUNITY)
Admission: EM | Admit: 2013-05-26 | Discharge: 2013-05-31 | DRG: 897 | Disposition: A | Discharge status: Home / Self Care | Payer: Managed Care, Other (non HMO) | Attending: Family Medicine | Admitting: Family Medicine

## 2013-05-26 ENCOUNTER — Encounter (HOSPITAL_COMMUNITY): Payer: Self-pay

## 2013-05-26 ENCOUNTER — Ambulatory Visit: Payer: Managed Care, Other (non HMO) | Admitting: Speech Pathology

## 2013-05-26 ENCOUNTER — Ambulatory Visit (INDEPENDENT_AMBULATORY_CARE_PROVIDER_SITE_OTHER): Payer: Managed Care, Other (non HMO) | Admitting: Sports Medicine

## 2013-05-26 VITALS — BP 169/113 | HR 78 | Temp 98.7°F | Ht 70.0 in | Wt 274.0 lb

## 2013-05-26 DIAGNOSIS — F1020 Alcohol dependence, uncomplicated: Secondary | ICD-10-CM | POA: Diagnosis present

## 2013-05-26 DIAGNOSIS — G4733 Obstructive sleep apnea (adult) (pediatric): Secondary | ICD-10-CM | POA: Diagnosis present

## 2013-05-26 DIAGNOSIS — F10939 Alcohol use, unspecified with withdrawal, unspecified: Principal | ICD-10-CM | POA: Diagnosis not present

## 2013-05-26 DIAGNOSIS — G9349 Other encephalopathy: Secondary | ICD-10-CM

## 2013-05-26 DIAGNOSIS — F10239 Alcohol dependence with withdrawal, unspecified: Principal | ICD-10-CM | POA: Diagnosis not present

## 2013-05-26 DIAGNOSIS — G92 Toxic encephalopathy: Secondary | ICD-10-CM

## 2013-05-26 DIAGNOSIS — F411 Generalized anxiety disorder: Secondary | ICD-10-CM | POA: Diagnosis present

## 2013-05-26 DIAGNOSIS — G4700 Insomnia, unspecified: Secondary | ICD-10-CM | POA: Diagnosis present

## 2013-05-26 DIAGNOSIS — I10 Essential (primary) hypertension: Secondary | ICD-10-CM

## 2013-05-26 DIAGNOSIS — F1021 Alcohol dependence, in remission: Secondary | ICD-10-CM

## 2013-05-26 DIAGNOSIS — K219 Gastro-esophageal reflux disease without esophagitis: Secondary | ICD-10-CM | POA: Diagnosis present

## 2013-05-26 DIAGNOSIS — E119 Type 2 diabetes mellitus without complications: Secondary | ICD-10-CM | POA: Diagnosis present

## 2013-05-26 DIAGNOSIS — M129 Arthropathy, unspecified: Secondary | ICD-10-CM | POA: Diagnosis present

## 2013-05-26 DIAGNOSIS — Z8674 Personal history of sudden cardiac arrest: Secondary | ICD-10-CM

## 2013-05-26 DIAGNOSIS — G931 Anoxic brain damage, not elsewhere classified: Secondary | ICD-10-CM

## 2013-05-26 DIAGNOSIS — I872 Venous insufficiency (chronic) (peripheral): Secondary | ICD-10-CM | POA: Diagnosis present

## 2013-05-26 DIAGNOSIS — Z6838 Body mass index (BMI) 38.0-38.9, adult: Secondary | ICD-10-CM

## 2013-05-26 DIAGNOSIS — E785 Hyperlipidemia, unspecified: Secondary | ICD-10-CM | POA: Diagnosis present

## 2013-05-26 DIAGNOSIS — E162 Hypoglycemia, unspecified: Secondary | ICD-10-CM

## 2013-05-26 DIAGNOSIS — E669 Obesity, unspecified: Secondary | ICD-10-CM

## 2013-05-26 LAB — COMPREHENSIVE METABOLIC PANEL
ALT: 58 U/L — ABNORMAL HIGH (ref 0–53)
AST: 121 U/L — ABNORMAL HIGH (ref 0–37)
CO2: 29 mEq/L (ref 19–32)
Calcium: 8.6 mg/dL (ref 8.4–10.5)
Creatinine, Ser: 1.02 mg/dL (ref 0.50–1.35)
GFR calc Af Amer: >90 mL/min (ref >90)
GFR calc non Af Amer: 85 mL/min — ABNORMAL LOW (ref >90)
Sodium: 138 mEq/L (ref 135–145)
Total Protein: 8.1 g/dL (ref 6.0–8.3)

## 2013-05-26 LAB — RAPID URINE DRUG SCREEN, HOSP PERFORMED
Amphetamines: NOT DETECTED
Cocaine: NOT DETECTED
Opiates: NOT DETECTED

## 2013-05-26 LAB — CBC
MCH: 29.8 pg (ref 26.0–34.0)
MCHC: 34.5 g/dL (ref 30.0–36.0)
MCV: 86.4 fL (ref 78.0–100.0)
Platelets: 314 10*3/uL (ref 150–400)
RBC: 3.32 MIL/uL — ABNORMAL LOW (ref 4.22–5.81)

## 2013-05-26 LAB — GLUCOSE, CAPILLARY

## 2013-05-26 LAB — SALICYLATE LEVEL: Salicylate Lvl: <2 mg/dL — ABNORMAL LOW (ref 2.8–20.0)

## 2013-05-26 MED ORDER — ASPIRIN 81 MG PO TBEC
81.0000 mg | DELAYED_RELEASE_TABLET | Freq: Every day | ORAL | Status: DC
  Administered 2013-05-27 – 2013-05-31 (×5): 81 mg via ORAL
  Filled 2013-05-26 (×6): qty 1

## 2013-05-26 MED ORDER — LORAZEPAM 1 MG PO TABS
1.0000 mg | ORAL_TABLET | Freq: Four times a day (QID) | ORAL | Status: DC | PRN
  Administered 2013-05-27 – 2013-05-28 (×5): 1 mg via ORAL
  Filled 2013-05-26 (×5): qty 1

## 2013-05-26 MED ORDER — ONDANSETRON HCL 4 MG/2ML IJ SOLN
4.0000 mg | Freq: Four times a day (QID) | INTRAMUSCULAR | Status: DC | PRN
  Administered 2013-05-27: 4 mg via INTRAVENOUS
  Filled 2013-05-26: qty 2

## 2013-05-26 MED ORDER — ONDANSETRON HCL 4 MG PO TABS
4.0000 mg | ORAL_TABLET | Freq: Four times a day (QID) | ORAL | Status: DC | PRN
  Administered 2013-05-27: 4 mg via ORAL
  Filled 2013-05-26: qty 1

## 2013-05-26 MED ORDER — HEPARIN SODIUM (PORCINE) 5000 UNIT/ML IJ SOLN
5000.0000 [IU] | Freq: Three times a day (TID) | INTRAMUSCULAR | Status: DC
  Administered 2013-05-26 – 2013-05-31 (×15): 5000 [IU] via SUBCUTANEOUS
  Filled 2013-05-26 (×17): qty 1

## 2013-05-26 MED ORDER — THIAMINE HCL 100 MG/ML IJ SOLN
100.0000 mg | Freq: Every day | INTRAMUSCULAR | Status: DC
  Filled 2013-05-26: qty 1

## 2013-05-26 MED ORDER — FUROSEMIDE 20 MG PO TABS
20.0000 mg | ORAL_TABLET | Freq: Every day | ORAL | Status: DC
  Administered 2013-05-26 – 2013-05-31 (×6): 20 mg via ORAL
  Filled 2013-05-26 (×6): qty 1

## 2013-05-26 MED ORDER — INSULIN ASPART 100 UNIT/ML ~~LOC~~ SOLN
0.0000 [IU] | Freq: Three times a day (TID) | SUBCUTANEOUS | Status: DC
  Administered 2013-05-27: 2 [IU] via SUBCUTANEOUS
  Administered 2013-05-28: 5 [IU] via SUBCUTANEOUS
  Administered 2013-05-28: 2 [IU] via SUBCUTANEOUS
  Administered 2013-05-29: 9 [IU] via SUBCUTANEOUS
  Administered 2013-05-29: 5 [IU] via SUBCUTANEOUS
  Administered 2013-05-29: 2 [IU] via SUBCUTANEOUS

## 2013-05-26 MED ORDER — ADULT MULTIVITAMIN W/MINERALS CH
1.0000 | ORAL_TABLET | Freq: Every day | ORAL | Status: DC
  Administered 2013-05-27 – 2013-05-28 (×3): 1 via ORAL
  Filled 2013-05-26 (×3): qty 1

## 2013-05-26 MED ORDER — LOSARTAN POTASSIUM 50 MG PO TABS
100.0000 mg | ORAL_TABLET | Freq: Every day | ORAL | Status: DC
  Administered 2013-05-26 – 2013-05-31 (×6): 100 mg via ORAL
  Filled 2013-05-26 (×6): qty 2

## 2013-05-26 MED ORDER — PNEUMOCOCCAL VAC POLYVALENT 25 MCG/0.5ML IJ INJ
0.5000 mL | INJECTION | INTRAMUSCULAR | Status: AC
  Administered 2013-05-27: 0.5 mL via INTRAMUSCULAR
  Filled 2013-05-26: qty 0.5

## 2013-05-26 MED ORDER — PANTOPRAZOLE SODIUM 40 MG PO TBEC
40.0000 mg | DELAYED_RELEASE_TABLET | Freq: Every day | ORAL | Status: DC
  Administered 2013-05-27 – 2013-05-31 (×5): 40 mg via ORAL
  Filled 2013-05-26 (×5): qty 1

## 2013-05-26 MED ORDER — TRAZODONE HCL 50 MG PO TABS
50.0000 mg | ORAL_TABLET | Freq: Every evening | ORAL | Status: DC | PRN
  Administered 2013-05-26 – 2013-05-31 (×4): 50 mg via ORAL
  Filled 2013-05-26 (×5): qty 1

## 2013-05-26 MED ORDER — VITAMIN B-1 100 MG PO TABS
100.0000 mg | ORAL_TABLET | Freq: Every day | ORAL | Status: DC
  Administered 2013-05-27 – 2013-05-28 (×3): 100 mg via ORAL
  Filled 2013-05-26 (×3): qty 1

## 2013-05-26 MED ORDER — SODIUM CHLORIDE 0.9 % IJ SOLN
3.0000 mL | Freq: Two times a day (BID) | INTRAMUSCULAR | Status: DC
  Administered 2013-05-26 – 2013-05-31 (×10): 3 mL via INTRAVENOUS

## 2013-05-26 MED ORDER — AMLODIPINE BESYLATE 10 MG PO TABS
10.0000 mg | ORAL_TABLET | Freq: Every day | ORAL | Status: DC
  Administered 2013-05-26 – 2013-05-31 (×6): 10 mg via ORAL
  Filled 2013-05-26 (×6): qty 1

## 2013-05-26 MED ORDER — INSULIN GLARGINE 100 UNIT/ML ~~LOC~~ SOLN
20.0000 [IU] | Freq: Two times a day (BID) | SUBCUTANEOUS | Status: DC
  Filled 2013-05-26 (×3): qty 0.2

## 2013-05-26 MED ORDER — SIMVASTATIN 20 MG PO TABS
20.0000 mg | ORAL_TABLET | Freq: Every day | ORAL | Status: DC
  Administered 2013-05-27 – 2013-05-30 (×4): 20 mg via ORAL
  Filled 2013-05-26 (×6): qty 1

## 2013-05-26 MED ORDER — LORAZEPAM 2 MG/ML IJ SOLN: 1.0000 mg | Freq: Four times a day (QID) | INTRAMUSCULAR | Status: DC | PRN

## 2013-05-26 MED ORDER — METOPROLOL SUCCINATE ER 100 MG PO TB24
100.0000 mg | ORAL_TABLET | Freq: Every day | ORAL | Status: DC
  Administered 2013-05-27 – 2013-05-31 (×5): 100 mg via ORAL
  Filled 2013-05-26 (×5): qty 1

## 2013-05-26 MED ORDER — FOLIC ACID 1 MG PO TABS
1.0000 mg | ORAL_TABLET | Freq: Every day | ORAL | Status: DC
  Administered 2013-05-27 – 2013-05-28 (×3): 1 mg via ORAL
  Filled 2013-05-26 (×3): qty 1

## 2013-05-26 NOTE — ED Notes (Signed)
Pt requesting detox from ETOH, pt reports he drinks about a 1/2 gallon of Vodka every day, pt reports he has been drinking for 20 years. Pt's PCP sent him to the ED for detox due to hx of Diabetes. Pt denies HI/SI pt reports he last took a drink of alcohol at approx 1000 this am

## 2013-05-26 NOTE — Progress Notes (Signed)
Attempted to call for report from ED. ED secretary got my number for RN to call back.

## 2013-05-26 NOTE — ED Provider Notes (Signed)
CSN: [id number]     Arrival date & time 05/26/13  1758 History     First MD Initiated Contact with Patient 05/26/13 1820     Chief Complaint  Patient presents with  . Medical Clearance   (Consider location/radiation/quality/duration/timing/severity/associated sxs/prior Treatment) HPI Jeffrey [name] is a 47 year old male with a PMH of Cardiac Arrest with anoxic ischemic encephalopathy, DM, HTN, OSA who presents to the ED after being seen by his PCP earlier today.  He told his PCP that he would like to stop drinking and his PCP did not feel comfortable with him trying to detox on his own so he sent him to the ED to be admitted for alcohol detox. Patient reports that he currently drinks 1/2 gallon of vodka per day, last drink 10am this morning.  He is accompanied by his wife and son and reports he is ready to quit and is unable to do so own his own.  He reports the last time he tried to cut back 2 weeks ago he developed shakes.  He is unsure if he has ever experienced DT or seizures from alcohol withdraw.  Past Medical History  Diagnosis Date  . Hypertension   . Blood transfusion   . Diabetes mellitus   . Venous insufficiency 12/13/2011  . Cardiac arrest 12/07/2012  . OSA (obstructive sleep apnea) 12/07/2012  . Anxiety   . Acute renal insufficiency 12/08/2012   Past Surgical History  Procedure Laterality Date  . Knee arthroscopy    . Esophagogastroduodenoscopy Left 11/26/2012    Procedure: ESOPHAGOGASTRODUODENOSCOPY (EGD);  Surgeon: Wonda Horner, MD;  Location: [facility] ENDOSCOPY;  Service: Endoscopy;  Laterality: Left;  . Cardiovascular stress test  03/16/13    Very Poor Exercise Tolerance; NON DIAGNOSTIC TEST   History reviewed. No pertinent family history. History  Substance Use Topics  . Smoking status: Never Smoker   . Smokeless tobacco: Not on file  . Alcohol Use: 8.4 oz/week    14 Cans of beer per week     Comment: couple cans day--few beers daily    Review of Systems   Constitutional: Negative for fever and chills.  HENT: Negative for congestion and sore throat.   Eyes: Negative for visual disturbance.  Respiratory: Negative for cough.   Cardiovascular: Negative for chest pain and palpitations.  Gastrointestinal: Negative for nausea, vomiting, diarrhea and constipation.  Endocrine: Negative for polydipsia and polyuria.  Genitourinary: Negative for dysuria and frequency.  Musculoskeletal: Negative for arthralgias.  Neurological: Negative for dizziness, light-headedness and headaches.  Psychiatric/Behavioral: Negative for agitation.    Allergies  Lisinopril  Home Medications   Current Outpatient Rx  Name  Route  Sig  Dispense  Refill  . amLODipine (NORVASC) 10 MG tablet   Oral   Take 1 tablet (10 mg total) by mouth daily.   90 tablet   3   . aspirin (ADULT ASPIRIN EC LOW STRENGTH) 81 MG EC tablet   Oral   Take 1 tablet (81 mg total) by mouth daily.   30 tablet   0   . furosemide (LASIX) 20 MG tablet   Oral   Take 1 tablet (20 mg total) by mouth daily.   90 tablet   3   . furosemide (LASIX) 40 MG tablet      TAKE ONE-HALF TABLET (20 MG) DAILY   90 tablet   2   . insulin glargine (LANTUS) 100 UNIT/ML injection   Subcutaneous   Inject 0.3 mLs (30 Units total)  into the skin 2 (two) times daily.   3 vial   11   . insulin lispro (HUMALOG) 100 UNIT/ML SOCT   Subcutaneous   Inject 5-10 Units into the skin 3 (three) times daily with meals. 5 units + 1 Unit for each 50 above 200  >400=5units and call doctor   3 cartridge   11   . losartan (COZAAR) 100 MG tablet   Oral   Take 1 tablet (100 mg total) by mouth daily.   90 tablet   3   . metoprolol succinate (TOPROL-XL) 100 MG 24 hr tablet   Oral   Take 1 tablet (100 mg total) by mouth daily. Take with or immediately following a meal.   90 tablet   3   . pantoprazole (PROTONIX) 40 MG tablet   Oral   Take 1 tablet (40 mg total) by mouth daily.   90 tablet   3   .  penicillin v potassium (VEETID) 500 MG tablet   Oral   Take 1 tablet (500 mg total) by mouth 4 (four) times daily.   40 tablet   0   . pravastatin (PRAVACHOL) 80 MG tablet   Oral   Take 1 tablet (80 mg total) by mouth at bedtime. for Cholesterol   90 tablet   3   . sildenafil (VIAGRA) 100 MG tablet   Oral   Take 100 mg by mouth daily as needed for erectile dysfunction. Takes 100mg  up to four hours before intercourse         . traZODone (DESYREL) 50 MG tablet   Oral   Take 1 tablet (50 mg total) by mouth at bedtime as needed. sleep   90 tablet   1    BP 122/77  Pulse 73  Temp(Src) 98.5 F (36.9 C) (Oral)  Resp 18  Ht 5\' 10"  (1.778 m)  Wt 271 lb (122.925 kg)  BMI 38.88 kg/m2  SpO2 98% Physical Exam  Nursing note and vitals reviewed. Constitutional: He is oriented to person, place, and time. He appears well-developed and well-nourished. No distress.  HENT:  Head: Normocephalic and atraumatic.  Eyes: Conjunctivae are normal. No scleral icterus.  Cardiovascular: Normal rate, regular rhythm and normal heart sounds.   No murmur heard. Pulmonary/Chest: Effort normal and breath sounds normal. No respiratory distress. He has no wheezes. He has no rales.  Abdominal: Soft. Bowel sounds are normal. He exhibits no distension. There is no tenderness.  Musculoskeletal: He exhibits no edema.  Neurological: He is alert and oriented to person, place, and time.  Not oriented to situation when re asked a few minutes later.  Skin: He is not diaphoretic.    ED Course   Procedures (including critical care time)  Labs Reviewed  ACETAMINOPHEN LEVEL  CBC  COMPREHENSIVE METABOLIC PANEL  ETHANOL  SALICYLATE LEVEL  URINE RAPID DRUG SCREEN (HOSP PERFORMED)   No results found. 1. Alcohol dependence   2. OSA on CPAP   3. Anoxic-ischemic encephalopathy   4. DM type 2, not at goal   5. HYPERTENSION, BENIGN SYSTEMIC     MDM  47 year old male with significant co-morbities who  presents to [facility] for alcohol withdraw treatment.  Patient drinks 1/2 gallon of vodka per day and last drink at 10am.  Will obtain screening labs, and likely admit for inpatient withdraw due to significant comobities. Labs pending, Family medicine to admit. Dr. Lita Mains will follow up labs and sign out patient if needed. CIWA protocol started.  Joni Reining, DO 05/26/13 1952

## 2013-05-26 NOTE — H&P (Signed)
[facility] Admission History and Physical Service Pager: [phone]  Patient name: Jeffrey [name] Medical record number: [id number] Date of birth: [birth year] Age: 47 y.o. Gender: male  Primary Care Provider: Teresa Coombs, DO Consultants: None Code Status: Full Code  Chief Complaint: Detoxification from alcohol   Assessment and Plan: Jeffrey [name] is a 47 y.o. year old male presenting for alcohol detoxification . PMH is significant for Cardiac Arrest likely related to OSA with anoxic ischemic encephalopathy, DM, HTN, OSA   # Detox for alcohol abuse -CIWA protocol -history PEA so monitor on telemetry -folic acid, thiamine and multivitamin  -SW consult -appreciate PCP input tomorrow, Dr. Paulla Fore  # transaminitis -due to alcohol abuse. AST/ALT 2:1 as expected.   # OSA -continue CPAP  # Hypertension -moderately elevated as missed doses today -continue amlodipine, lasix, cozaar, metoprolol, aspirin  # Diabetes Mellitus -last a1c 9.5 -Lantus 20 units twice a day as reports occasional hypoglycemia and sensitive SSI  # Hyperlipidemia -continue pravastatin  #GERD -continue PPI  # Insomnia -continue trazodone prn  FEN/GI: carb modified diet, no IVF Prophylaxis: Heparin   Disposition: pending detoxification  History of Present Illness: Jeffrey [name] is a 47 y.o. year old male presenting with desire to stop drinking alcohol.  For patient, 1/2 gallon of vodka takes about 2-3 days to drink. Gets shaky when he tries to come off for more than 24 hours. Last drink 10am. Last time he tried to quit was 2 weeks ago. No known history DTs.   Patient states outside of wanting to quit drinking, he has no other complaints. He has been feeling well recently and had negative ROS. Per wife patient is at baseline (short term memory issues).   Review Of Systems: Per HPI  Otherwise 12 point review of systems was performed and was unremarkable.  Patient  Active Problem List   Diagnosis Date Noted  . OSA on CPAP 12/28/2012  . Acute renal insufficiency 12/08/2012  . Pulmonary edema 12/08/2012  . Hypoglycemia 12/07/2012  . Cardiac arrest 12/07/2012  . Diarrhea 12/07/2012  . Obesity 12/07/2012  . Anoxic-ischemic encephalopathy 12/07/2012  . Acute respiratory failure 12/06/2012  . GI bleed 11/26/2012  . Venous insufficiency 12/13/2011  . Leg wound, left 12/03/2011  . INSOMNIA, CHRONIC 02/14/2009  . 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 ALCOHOL ABUSE, HX OF 11/10/2007  . ERECTILE DYSFUNCTION 04/14/2007  . DM type 2, not at goal 12/04/2006  . HYPERCHOLESTEROLEMIA 12/04/2006  . OBESITY, NOS 12/04/2006  . ANXIETY 12/04/2006  . HYPERTENSION, BENIGN SYSTEMIC 12/04/2006  . ARTHRITIS 12/04/2006   Past Medical History: Past Medical History  Diagnosis Date  . Hypertension   . Blood transfusion   . Diabetes mellitus   . Venous insufficiency 12/13/2011  . Cardiac arrest 12/07/2012  . OSA (obstructive sleep apnea) 12/07/2012  . Anxiety   . Acute renal insufficiency 12/08/2012   Past Surgical History: Past Surgical History  Procedure Laterality Date  . Knee arthroscopy    . Esophagogastroduodenoscopy Left 11/26/2012    Procedure: ESOPHAGOGASTRODUODENOSCOPY (EGD);  Surgeon: Wonda Horner, MD;  Location: [facility] ENDOSCOPY;  Service: Endoscopy;  Laterality: Left;  . Cardiovascular stress test  03/16/13    Very Poor Exercise Tolerance; NON DIAGNOSTIC TEST   Social History: History  Substance Use Topics  . Smoking status: Never Smoker   . Smokeless tobacco: Not on file  . Alcohol Use: 8.4 oz/week    14 Cans of beer per week  Comment: couple cans day--few beers daily  does not do any illicit drugs  Additional social history: lives with wife and 4 kids,  On disability due to history of cardiac arrest Please also refer to relevant sections of EMR.  Family History: History reviewed. No pertinent family history. Allergies  and Medications: Allergies  Allergen Reactions  . Lisinopril Other (See Comments)    Pt reports nose bleed.   No current facility-administered medications on file prior to encounter.   Current Outpatient Prescriptions on File Prior to Encounter  Medication Sig Dispense Refill  . amLODipine (NORVASC) 10 MG tablet Take 1 tablet (10 mg total) by mouth daily.  90 tablet  3  . aspirin (ADULT ASPIRIN EC LOW STRENGTH) 81 MG EC tablet Take 1 tablet (81 mg total) by mouth daily.  30 tablet  0  . furosemide (LASIX) 20 MG tablet Take 1 tablet (20 mg total) by mouth daily.  90 tablet  3  . insulin glargine (LANTUS) 100 UNIT/ML injection Inject 0.3 mLs (30 Units total) into the skin 2 (two) times daily.  3 vial  11  . insulin lispro (HUMALOG) 100 UNIT/ML SOCT Inject 5-10 Units into the skin 3 (three) times daily with meals. 5 units + 1 Unit for each 50 above 200  >400=5units and call doctor  3 cartridge  11  . losartan (COZAAR) 100 MG tablet Take 1 tablet (100 mg total) by mouth daily.  90 tablet  3  . metoprolol succinate (TOPROL-XL) 100 MG 24 hr tablet Take 1 tablet (100 mg total) by mouth daily. Take with or immediately following a meal.  90 tablet  3  . pantoprazole (PROTONIX) 40 MG tablet Take 1 tablet (40 mg total) by mouth daily.  90 tablet  3  . pravastatin (PRAVACHOL) 80 MG tablet Take 1 tablet (80 mg total) by mouth at bedtime. for Cholesterol  90 tablet  3  . sildenafil (VIAGRA) 100 MG tablet Take 100 mg by mouth daily as needed for erectile dysfunction. Takes 100mg  up to four hours before intercourse      . traZODone (DESYREL) 50 MG tablet Take 1 tablet (50 mg total) by mouth at bedtime as needed. sleep  90 tablet  1  . penicillin v potassium (VEETID) 500 MG tablet Take 1 tablet (500 mg total) by mouth 4 (four) times daily.  40 tablet  0    Objective: BP 122/77  Pulse 73  Temp(Src) 98.5 F (36.9 C) (Oral)  Resp 18  Ht 5\' 10"  (1.778 m)  Wt 271 lb (122.925 kg)  BMI 38.88 kg/m2  SpO2  98% Exam: General: morbidly obese, NAD HEENT: MMM, no lymphadenopathy Cardiovascular: regular rate and rhythm. No murmurs rubs or gallops Respiratory: CTAB Abdomen: soft/nontender/nondistended/ no rebound or guarding Extremities: trace edema bilaterally  Skin: warm, dry Neuro: grossly intact, alert and oriented to person, place, reason for visit and year. Could not recall month.   Labs and Imaging: CBC BMET   Recent Labs Lab 05/26/13 1810  WBC 6.4  HGB 9.9*  HCT 28.7*  PLT 314    Recent Labs Lab 05/26/13 1810  NA 138  K 4.9  CL 94*  CO2 29  BUN 9  CREATININE 1.02  GLUCOSE 122*  CALCIUM 8.6     Results for orders placed during the hospital encounter of 05/26/13 (from the past 24 hour(s))  ACETAMINOPHEN LEVEL     Status: None   Collection Time    05/26/13  6:10 PM  Result Value Range   Acetaminophen (Tylenol), Serum <15.0  10 - 30 ug/mL   Total Protein 8.1  6.0 - 8.3 g/dL   Albumin 3.1 (*) 3.5 - 5.2 g/dL   AST 121 (*) 0 - 37 U/L   ALT 58 (*) 0 - 53 U/L   Alkaline Phosphatase 124 (*) 39 - 117 U/L   Total Bilirubin 0.5  0.3 - 1.2 mg/dL   GFR calc non Af Amer 85 (*) >90 mL/min   GFR calc Af Amer >90  >90 mL/min  ETHANOL     Status: Abnormal   Collection Time    05/26/13  6:10 PM      Result Value Range   Alcohol, Ethyl (B) 354 (*) 0 - 11 mg/dL  SALICYLATE LEVEL     Status: Abnormal   Collection Time    05/26/13  6:10 PM      Result Value Range   Salicylate Lvl 123456 (*) 2.8 - 20.0 mg/dL  URINE RAPID DRUG SCREEN (HOSP PERFORMED)     Status: None   Collection Time    05/26/13  9:41 PM      Result Value Range   Opiates NONE DETECTED  NONE DETECTED   Cocaine NONE DETECTED  NONE DETECTED   Benzodiazepines NONE DETECTED  NONE DETECTED   Amphetamines NONE DETECTED  NONE DETECTED   Tetrahydrocannabinol NONE DETECTED  NONE DETECTED   Barbiturates NONE DETECTED  NONE DETECTED     Marin Olp, MD 05/26/2013, 8:27 PM PGY-3, [place] Intern pager: [phone], text pages welcome

## 2013-05-26 NOTE — Progress Notes (Signed)
  [facility]  Patient name: Jeffrey [name] MRN [id number]  Date of birth: April 29, 1966  CC & HPI:  Jeffrey [name] is a 47 y.o. male presenting to clinic.  Chief Complaint  Patient presents with  . Follow-up    Wants to return to work, Seen by [employer] Reports a neuro rehabilitation speech therapist seems to think that he is significantly improved.    . Medical Managment of Chronic Issues Alcohol abuse patient continues to drink on a daily basis.  He is still very interested in quitting and reports that he has tried with significant symptoms previously.  Reports his last drink was approximately 36 hours ago.  Hypertension, diabetes.  Patient denies any kind chest pain, shortness of breath , dyspnea on exertion, orthopnea.  Sugars still have been difficult to control but he has been compliant with his medications     ROS:  PER HPI  Pertinent History Reviewed:  Medical & Surgical Hx:  Reviewed: Significant for history of PEA arrest due to OSA, significant alcohol abuse.  Medications: Reviewed & Updated - see associated section Social History: Reviewed -  reports that he has never smoked. He does not have any smokeless tobacco history on file.  Objective Findings:  Vitals: BP 169/113  Pulse 78  Temp(Src) 98.7 F (37.1 C) (Oral)  Ht 5\' 10"  (1.778 m)  Wt 274 lb (124.286 kg)  BMI 39.32 kg/m2 PE: GENERAL:  adult obese African American male. In no discomfort; no respiratory distress,  Markedly tremulous on exam  PSYCH:  alert and appropriate, good insight   quite tangential thought process, with per separation on getting back to work.  Poor insight and has a hard time recalling aspects of our conversation   HNEENT:    CARDIO:  RRR, S1/S2 heard, no murmur  LUNGS:  CTA B, no wheezes, no crackles  ABDOMEN:    EXTREM:  warm well perfused, moves all 4 extremities spontaneously. No edema   GU:   SKIN:   NEUROMSK:     Assessment & Plan:  No diagnosis  found. See problem associated charting

## 2013-05-27 DIAGNOSIS — F1020 Alcohol dependence, uncomplicated: Secondary | ICD-10-CM

## 2013-05-27 LAB — GLUCOSE, CAPILLARY
Glucose-Capillary: 36 mg/dL — CL (ref 70–99)
Glucose-Capillary: 93 mg/dL (ref 70–99)
Glucose-Capillary: 75 mg/dL (ref 70–99)
Glucose-Capillary: 167 mg/dL — ABNORMAL HIGH (ref 70–99)

## 2013-05-27 LAB — TSH: TSH: 1.597 u[IU]/mL (ref 0.350–4.500)

## 2013-05-27 MED ORDER — GLUCERNA SHAKE PO LIQD
237.0000 mL | Freq: Every day | ORAL | Status: DC
  Administered 2013-05-27 – 2013-05-30 (×4): 237 mL via ORAL

## 2013-05-27 MED ORDER — INSULIN GLARGINE 100 UNIT/ML ~~LOC~~ SOLN
10.0000 [IU] | Freq: Two times a day (BID) | SUBCUTANEOUS | Status: DC
  Filled 2013-05-27 (×2): qty 0.1

## 2013-05-27 NOTE — Care Management Note (Unsigned)
    Page 1 of 1   05/27/2013     3:38:58 PM   CARE MANAGEMENT NOTE 05/27/2013  Patient:  Jeffrey [name], Jeffrey [name]   Account Number:  [id number]  Date Initiated:  05/27/2013  Documentation initiated by:  Tomi Bamberger  Subjective/Objective Assessment:   dx ETOH detox, encephalopathy  admit- lives with wife and kids     Action/Plan:   Anticipated DC Date:  05/29/2013   Anticipated DC Plan:  [place]  CM consult      Choice offered to / List presented to:             Status of service:  In process, will continue to follow Medicare Important Message given?   (If response is "NO", the following Medicare IM given date fields will be blank) Date Medicare IM given:   Date Additional Medicare IM given:    Discharge Disposition:    Per UR Regulation:  Reviewed for med. necessity/level of care/duration of stay  If discussed at [place] of Stay Meetings, dates discussed:    Comments:  05/27/13 15:38 Tomi Bamberger RN, BSN [phone] patient lives with family, pta indep,  NCM will continue to follow for dc needs.

## 2013-05-27 NOTE — ED Provider Notes (Signed)
I saw and evaluated the patient, reviewed the resident's note and I agree with the findings and plan. Pt with long history of alcohol abuse presents for detox. Spoke with his PMD who recommended inpt admission due to significant co-morbidities. Pt last drank this AM. Currently without signs of withdrawal. No complaints. Discussed with Family medicine service who will admit.   Julianne Rice, MD 05/27/13 1655

## 2013-05-27 NOTE — H&P (Signed)
FMTS Attending Admission Note: Dorcas Mcmurray MD [phone] pager office [phone] I  have seen and examined this patient, reviewed their chart. I have discussed this patient with the resident. I agree with the resident's findings, assessment and care plan. No signs of withdrawal at my rounds. A little tearful.

## 2013-05-27 NOTE — Progress Notes (Signed)
Family Medicine Teaching Service Daily Progress Note Intern Pager: [phone]  Patient name: Jeffrey [name] Medical record number: [id number] Date of birth: [birth year] Age: 47 y.o. Gender: male  Primary Care Provider: Teresa Coombs, DO Consultants: none Code Status: Full  Pt Overview and Major Events to Date:  8/20 - CIWA score 0  Assessment and Plan: Jeffrey [name] is a 47 y.o. year old male presenting for alcohol detoxification . 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 Paulla Fore  -CIWA score overnight 0 -Ativan x1 at 08:22 this AM  # transaminitis: due to alcohol abuse. AST/ALT 2:1 as expected.   # OSA: continue CPAP   # Hypertension  -moderately elevated as missed doses today  -continue amlodipine, lasix, cozaar, metoprolol, aspirin -140-150s/90s-105   # Diabetes Mellitus  -last a1c 9.5  -Lantus 20 units twice a day as reports occasional hypoglycemia and sensitive SSI -Hypoglycemic this AM at 04:30, CBG 32. OJ given and re-check at 05:02 69, 05:15 93. -Lantus d/c'd, sensitive SSI only. Monitor CBGs   # Hyperlipidemia: continue pravastatin  # GERD: continue PPI  # Insomnia: continue trazodone prn   FEN/GI: carb modified diet, no IVF  Prophylaxis: Heparin   Disposition: Home or outpatient rehab pending SW  Subjective:  Patient reports he feels "bad" and is anxious/nervous. Just before I entered the room he vomited on the floor. No complaints of pain. He says his last drink was at 10am yesterday. Gets uncontrollable shakes and chills at times, but not present while I was in the room. He has not had an appetite, last ate a hotdog 3-4 days ago. No recent BM and urinating without difficulty. He tells me he has tried to quit alcohol  cold [place] twice, most recent was 2 weeks ago in which he lasted 3 days before relapsing. His daily alcohol intake is around 12oz of vodka.  Objective: Temp:  [98.5 F (36.9 C)-98.7 F (37.1 C)] 98.7 F (37.1 C) (08/21 0539) Pulse Rate:  [68-87] 87 (08/21 0539) Resp:  [18-21] 18 (08/21 0539) BP: (122-169)/(77-113) 149/92 mmHg (08/21 0539) SpO2:  [96 %-99 %] 98 % (08/21 0539) Weight:  [265 lb 14 oz (120.6 kg)-274 lb (124.286 kg)] 265 lb 14 oz (120.6 kg) (08/21 0539) Physical Exam: General: NAD, laying on side of bed with garbage can and vomit on floor (clear fluid) HEENT: NCAT, PERRL, EOMI Cardiovascular: RRR, normal s1/s2, no m/r/g appreciated Respiratory: CTAB, effort normal Abdomen: bowel sounds present, nontender Extremities: trace edema bilaterally Neuro: bilateral intention tremor  Laboratory:  Recent Labs Lab 05/26/13 1810  WBC 6.4  HGB 9.9*  HCT 28.7*  PLT 314    Recent Labs Lab 05/26/13 1810  NA 138  K 4.9  CL 94*  CO2 29  BUN 9  CREATININE 1.02  CALCIUM 8.6  PROT 8.1  BILITOT 0.5  ALKPHOS 124*  ALT 58*  AST 121*  GLUCOSE 122*    Imaging/Diagnostic Tests: None  Tawanna Sat, MD 05/27/2013, 6:55 AM PGY-1, [place] Intern pager: [phone], text pages welcome

## 2013-05-27 NOTE — Progress Notes (Signed)
Hypoglycemic Event  CBG: 36  Treatment: 15 GM carbohydrate snack  Symptoms: Sweaty  Follow-up CBG: [id number] CBG Result:32; gave 2 orange juices & rechecked at 0502, BS now 42 & pt stated to come back to recheck in 15 minutes before another OJ. AT 0515 BS was 93.  Possible Reasons for Event: Inadequate meal intake  Comments/MD notified:Hunter    Pollie Friar  Remember to initiate Hypoglycemia Order Set & complete

## 2013-05-27 NOTE — Progress Notes (Signed)
INITIAL NUTRITION ASSESSMENT  DOCUMENTATION CODES Per approved criteria  -Obesity Unspecified   INTERVENTION:  Glucerna Shake daily (220 kcals, 9.9 gm protein per 8 fl oz bottle)  Continue MVI daily RD to follow for nutrition care plan  NUTRITION DIAGNOSIS: Inadequate oral intake related to poor appetite as evidenced by patient report  Goal: Pt to meet >/= 90% of their estimated nutrition needs   Monitor:  PO & supplemental intake, weight, labs, I/O's  Reason for Assessment: Malnutrition Screening Tool Report  47 y.o. male  Admitting Dx: detoxification from alcohol   ASSESSMENT: Patient with PMH of DM, HTN and OSA presented for alcohol detoxification.   Patient reports he's had a decreased appetite for weeks; he states "seeing food makes me nausea;" PO intake 50% per flowsheet records; has had some weight loss more than likely due to fluid (on Lasix); on MVI daily; amenable to trying Glucerna Shake supplement ---> RD to order.  Height: Ht Readings from Last 1 Encounters:  05/26/13 5\' 10"  (1.778 m)    Weight: Wt Readings from Last 1 Encounters:  05/27/13 265 lb 14 oz (120.6 kg)    Ideal Body Weight: 166 lb  % Ideal Body Weight: 160%  Wt Readings from Last 10 Encounters:  05/27/13 265 lb 14 oz (120.6 kg)  05/26/13 274 lb (124.286 kg)  04/07/13 273 lb (123.832 kg)  03/11/13 272 lb (123.378 kg)  02/26/13 278 lb (126.1 kg)  02/04/13 279 lb 6.4 oz (126.735 kg)  02/02/13 276 lb 9.6 oz (125.465 kg)  01/27/13 269 lb 2.9 oz (122.1 kg)  12/29/12 260 lb (117.935 kg)  12/23/12 265 lb 12.8 oz (120.566 kg)    Usual Body Weight: 275 lb  % Usual Body Weight: 96%  BMI:  Body mass index is 38.15 kg/(m^2).  Estimated Nutritional Needs: Kcal: 1900-2100 Protein: 95-105 gm Fluid: 1.9-2.1 L  Skin: Intact  Diet Order: Carb Control  EDUCATION NEEDS: -No education needs identified at this time   Intake/Output Summary (Last 24 hours) at 05/27/13 1418 Last data  filed at 05/27/13 1300  Gross per 24 hour  Intake    320 ml  Output      0 ml  Net    320 ml    Labs:   Recent Labs Lab 05/26/13 1810  NA 138  K 4.9  CL 94*  CO2 29  BUN 9  CREATININE 1.02  CALCIUM 8.6  GLUCOSE 122*    CBG (last 3)   Recent Labs  05/27/13 0521 05/27/13 0737 05/27/13 1129  GLUCAP 93 74 75    Scheduled Meds: . amLODipine  10 mg Oral Daily  . aspirin  81 mg Oral Daily  . folic acid  1 mg Oral Daily  . furosemide  20 mg Oral Daily  . heparin  5,000 Units Subcutaneous Q8H  . insulin aspart  0-9 Units Subcutaneous TID WC  . losartan  100 mg Oral Daily  . metoprolol succinate  100 mg Oral Daily  . multivitamin with minerals  1 tablet Oral Daily  . pantoprazole  40 mg Oral Daily  . simvastatin  20 mg Oral q1800  . sodium chloride  3 mL Intravenous Q12H  . thiamine  100 mg Oral Daily    Continuous Infusions:   Past Medical History  Diagnosis Date  . Hypertension   . Blood transfusion   . Diabetes mellitus   . Venous insufficiency 12/13/2011  . Cardiac arrest 12/07/2012  . OSA (obstructive sleep apnea) 12/07/2012  .  Anxiety   . Acute renal insufficiency 12/08/2012    Past Surgical History  Procedure Laterality Date  . Knee arthroscopy    . Esophagogastroduodenoscopy Left 11/26/2012    Procedure: ESOPHAGOGASTRODUODENOSCOPY (EGD);  Surgeon: Wonda Horner, MD;  Location: [facility] ENDOSCOPY;  Service: Endoscopy;  Laterality: Left;  . Cardiovascular stress test  03/16/13    Very Poor Exercise Tolerance; NON DIAGNOSTIC TEST    Arthur Holms, RD, LDN Pager #: [phone] After-Hours Pager #: [phone]

## 2013-05-27 NOTE — Progress Notes (Signed)
FMTS Attending Admission Note: Dietra Stokely,MD I  have seen and examined this patient, reviewed their chart. I have discussed this patient with the resident. I agree with the resident's findings, assessment and care plan.  

## 2013-05-28 DIAGNOSIS — E669 Obesity, unspecified: Secondary | ICD-10-CM

## 2013-05-28 DIAGNOSIS — F411 Generalized anxiety disorder: Secondary | ICD-10-CM

## 2013-05-28 LAB — GLUCOSE, CAPILLARY
Glucose-Capillary: 161 mg/dL — ABNORMAL HIGH (ref 70–99)
Glucose-Capillary: 261 mg/dL — ABNORMAL HIGH (ref 70–99)

## 2013-05-28 MED ORDER — ADULT MULTIVITAMIN W/MINERALS CH
1.0000 | ORAL_TABLET | Freq: Every day | ORAL | Status: DC
  Administered 2013-05-29: 1 via ORAL
  Filled 2013-05-28 (×2): qty 1

## 2013-05-28 MED ORDER — LORAZEPAM 2 MG/ML IJ SOLN: 1.0000 mg | Freq: Four times a day (QID) | INTRAMUSCULAR | Status: DC | PRN

## 2013-05-28 MED ORDER — VITAMIN B-1 100 MG PO TABS
100.0000 mg | ORAL_TABLET | Freq: Every day | ORAL | Status: DC
  Administered 2013-05-29: 100 mg via ORAL
  Filled 2013-05-28 (×2): qty 1

## 2013-05-28 MED ORDER — LORAZEPAM 1 MG PO TABS
1.0000 mg | ORAL_TABLET | Freq: Four times a day (QID) | ORAL | Status: DC | PRN
  Administered 2013-05-28 – 2013-05-29 (×3): 1 mg via ORAL
  Filled 2013-05-28 (×3): qty 1

## 2013-05-28 MED ORDER — FOLIC ACID 1 MG PO TABS
1.0000 mg | ORAL_TABLET | Freq: Every day | ORAL | Status: DC
  Administered 2013-05-29: 1 mg via ORAL
  Filled 2013-05-28 (×2): qty 1

## 2013-05-28 MED ORDER — THIAMINE HCL 100 MG/ML IJ SOLN
100.0000 mg | Freq: Every day | INTRAMUSCULAR | Status: DC
  Filled 2013-05-28 (×2): qty 1

## 2013-05-28 NOTE — Progress Notes (Signed)
Family Medicine Teaching Service Daily Progress Note Intern Pager: [phone]  Patient name: Jeffrey [name] Medical record number: [id number] Date of birth: [birth year] Age: 47 y.o. Gender: male  Primary Care Provider: Teresa Coombs, DO Consultants: none  Code Status: Full   Pt Overview and Major Events to Date:  8/20 - CIWA score 0 8/22 - CIWA 5, Received 4 ativan to date   Assessment and Plan: SHIGERU [name] is a 47 y.o. year old male presenting for alcohol detoxification . 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 Paulla Fore  -CIWA score overnight 5, very mild  -Ativan x 4 through 8/22  # transaminitis: due to alcohol abuse. AST/ALT 2:1 as expected.   # OSA: continue CPAP   # Hypertension  - BP 140'Jeffrey-160'Jeffrey/ 70'Jeffrey- 90'Jeffrey   -continue amlodipine, lasix, cozaar, metoprolol, aspirin   # Diabetes Mellitus  -last a1c 9.5  -Lantus d/c'd, sensitive SSI only.  [ ]  Monitor CBGs   # Hyperlipidemia: continue pravastatin   # GERD: continue PPI   # Insomnia: continue trazodone prn   FEN/GI: carb modified diet, no IVF  Prophylaxis: Heparin   Disposition: Home or outpatient rehab pending SW  Subjective: Patient complained of bugs crawling on him last night for two hours. He had some emesis but denies diarrhea. He feels his anxiety is worse than baseline but is affected by him trying to detox. His last drink was Wednesday at 10 AM.   Objective: Temp:  [98.6 F (37 C)-99.3 F (37.4 C)] 99.1 F (37.3 C) (08/22 0612) Pulse Rate:  [74-82] 82 (08/22 0612) Resp:  [18] 18 (08/21 2143) BP: (142-165)/(74-95) 145/95 mmHg (08/22 0612) SpO2:  [95 %-98 %] 95 % (08/22 0612) Physical Exam: General: NAD, sitting in car with food on his gown   Cardiovascular: RRR, normal s1/s2, no m/r/g appreciated  Respiratory: CTAB, effort normal  Abdomen: bowel sounds present, nontender  Extremities: trace edema bilaterally   Laboratory:  Recent Labs Lab 05/26/13 1810  WBC 6.4  HGB 9.9*  HCT 28.7*  PLT 314    Recent Labs Lab 05/26/13 1810  NA 138  K 4.9  CL 94*  CO2 29  BUN 9  CREATININE 1.02  CALCIUM 8.6  PROT 8.1  BILITOT 0.5  ALKPHOS 124*  ALT 58*  AST 121*  GLUCOSE 122*    Recent Labs Lab 05/27/13 0737 05/27/13 1129 05/27/13 1752 05/27/13 2143 05/28/13 0820  GLUCAP 74 75 167* 130* 92   Imaging/Diagnostic Tests: None   Clearance Coots, MD 05/28/2013, 9:54 AM PGY-1, [place] Intern pager: [phone], text pages welcome

## 2013-05-28 NOTE — Clinical Social Work Psychosocial (Signed)
Clinical Social Work Department BRIEF PSYCHOSOCIAL ASSESSMENT 05/28/2013  Patient:  Jeffrey [name], Jeffrey [name]     Account Number:  [id number]     Admit date:  05/26/2013  Clinical Social Worker:  Lovey Newcomer  Date/Time:  05/28/2013 12:20 PM  Referred by:  Physician  Date Referred:  05/28/2013 Referred for  Substance Abuse   Other Referral:   Interview type:  Patient Other interview type:    PSYCHOSOCIAL DATA Living Status:  WIFE Admitted from facility:   Level of care:   Primary support name:  Carvis Mankiewicz [phone] Primary support relationship to patient:  SPOUSE Degree of support available:   Support is strong.    CURRENT CONCERNS Current Concerns  Substance Abuse   Other Concerns:    SOCIAL WORK ASSESSMENT / PLAN CSW met with patient to discuss his alcohol use and to provide substance abuse resources available in the community. Patient has been consuming about 1/2 gal. of vodka every three days for the past several years. Initially patient was not interested in accepting substance abuse resources, but through motivational interviewing, patient decided that it would not hurt to look into his treatment options post discharge. CSW educated patient on the treatment options available in the community. Treatment center list and local AA meeting schedule was given. Patient stated that this recent experience has made him determined to never drink again. Patient stated that he really just wants his withdrawal symptoms managed. Patient says that he wants to stop drinking for his health, wife, and children. When asked why he drinks, patient stated that he "loves" drinking and that it helps him with stress. Patient states that he knows of other ways to address stress but prefers to turn to drinking. CSW contact information left with patient. CSW signing off.   Assessment/plan status:  No Further Intervention Required Other assessment/ plan:   Information/referral to community  resources:   CSW contact information, treatment facility list, and local AA meeting schedule.    PATIENT'S/FAMILY'S RESPONSE TO PLAN OF CARE: Patient is apprehensive about seeking treatment post discharge but states that he will consider his options. Patient wants his withdrawal symptoms managed and feels that he can maintain sobriety without inpatient or outpatient treatment. Patient was pleasant and appreciative of CSW visit.       Liz Beach, [place], [place], [id number]

## 2013-05-28 NOTE — Progress Notes (Signed)
Patient stated that he would place himself on CPAP when he was ready for bed.  RT made patient aware that if he needed any assistance to let RN know and RT would come help.

## 2013-05-28 NOTE — Progress Notes (Signed)
FMTS Attending Admission Note: Jeffrey Bartelson,MD I  have seen and examined this patient, reviewed their chart. I have discussed this patient with the resident. I agree with the resident's findings, assessment and care plan.  

## 2013-05-29 DIAGNOSIS — E162 Hypoglycemia, unspecified: Secondary | ICD-10-CM

## 2013-05-29 LAB — GLUCOSE, CAPILLARY
Glucose-Capillary: 195 mg/dL — ABNORMAL HIGH (ref 70–99)
Glucose-Capillary: 263 mg/dL — ABNORMAL HIGH (ref 70–99)
Glucose-Capillary: 351 mg/dL — ABNORMAL HIGH (ref 70–99)

## 2013-05-29 MED ORDER — ADULT MULTIVITAMIN W/MINERALS CH
1.0000 | ORAL_TABLET | Freq: Every day | ORAL | Status: DC
  Administered 2013-05-30 – 2013-05-31 (×2): 1 via ORAL
  Filled 2013-05-29 (×2): qty 1

## 2013-05-29 MED ORDER — INSULIN ASPART 100 UNIT/ML ~~LOC~~ SOLN
0.0000 [IU] | Freq: Three times a day (TID) | SUBCUTANEOUS | Status: DC
  Administered 2013-05-30: 4 [IU] via SUBCUTANEOUS
  Administered 2013-05-30: 3 [IU] via SUBCUTANEOUS
  Administered 2013-05-30: 9 [IU] via SUBCUTANEOUS

## 2013-05-29 MED ORDER — CYCLOBENZAPRINE HCL 10 MG PO TABS
5.0000 mg | ORAL_TABLET | Freq: Three times a day (TID) | ORAL | Status: DC | PRN
  Administered 2013-05-29 – 2013-05-31 (×2): 5 mg via ORAL
  Filled 2013-05-29 (×3): qty 0.5

## 2013-05-29 MED ORDER — FOLIC ACID 1 MG PO TABS
1.0000 mg | ORAL_TABLET | Freq: Every day | ORAL | Status: DC
  Filled 2013-05-29: qty 1

## 2013-05-29 MED ORDER — THIAMINE HCL 100 MG/ML IJ SOLN
100.0000 mg | Freq: Every day | INTRAMUSCULAR | Status: DC
  Filled 2013-05-29: qty 1

## 2013-05-29 MED ORDER — INSULIN GLARGINE 100 UNIT/ML ~~LOC~~ SOLN
10.0000 [IU] | Freq: Every day | SUBCUTANEOUS | Status: DC
Start: 2013-05-29 — End: 2013-05-31
  Administered 2013-05-29 – 2013-05-31 (×3): 10 [IU] via SUBCUTANEOUS
  Filled 2013-05-29 (×4): qty 0.1

## 2013-05-29 MED ORDER — ADULT MULTIVITAMIN W/MINERALS CH
1.0000 | ORAL_TABLET | Freq: Every day | ORAL | Status: DC
  Filled 2013-05-29: qty 1

## 2013-05-29 MED ORDER — VITAMIN B-1 100 MG PO TABS
100.0000 mg | ORAL_TABLET | Freq: Every day | ORAL | Status: DC
  Administered 2013-05-30 – 2013-05-31 (×2): 100 mg via ORAL
  Filled 2013-05-29 (×2): qty 1

## 2013-05-29 MED ORDER — FOLIC ACID 1 MG PO TABS
1.0000 mg | ORAL_TABLET | Freq: Every day | ORAL | Status: DC
  Administered 2013-05-30 – 2013-05-31 (×2): 1 mg via ORAL
  Filled 2013-05-29 (×2): qty 1

## 2013-05-29 MED ORDER — HYDROXYZINE HCL 50 MG PO TABS
50.0000 mg | ORAL_TABLET | Freq: Four times a day (QID) | ORAL | Status: DC | PRN
  Administered 2013-05-29 – 2013-05-31 (×2): 50 mg via ORAL
  Filled 2013-05-29 (×4): qty 1

## 2013-05-29 MED ORDER — INSULIN ASPART 100 UNIT/ML ~~LOC~~ SOLN
0.0000 [IU] | Freq: Every day | SUBCUTANEOUS | Status: DC
  Administered 2013-05-29: 5 [IU] via SUBCUTANEOUS

## 2013-05-29 MED ORDER — VITAMIN B-1 100 MG PO TABS
100.0000 mg | ORAL_TABLET | Freq: Every day | ORAL | Status: DC
  Filled 2013-05-29: qty 1

## 2013-05-29 NOTE — Progress Notes (Signed)
FMTS Attending Admission Note: Jeffrey Blatz,MD I  have seen and examined this patient, reviewed their chart. I have discussed this patient with the resident. I agree with the resident's findings, assessment and care plan.  

## 2013-05-29 NOTE — Progress Notes (Signed)
Family Medicine Teaching Service Daily Progress Note Intern Pager: [phone]  Patient name: Jeffrey [name] Medical record number: [id number] Date of birth: 12/15/1965 Age: 47 y.o. Gender: male  Primary Care Provider: Teresa Coombs, DO Consultants: none  Code Status: Full   Pt Overview and Major Events to Date:  8/20 - CIWA score 0 8/22 - CIWA 5, Received 4 ativan to date   Assessment and Plan: Jeffrey [name] is a 47 y.o. year old male presenting for alcohol detoxification . PMH is significant for Cardiac Arrest likely related to Jeffrey with anoxic ischemic encephalopathy, DM, HTN, Jeffrey   # Detox for alcohol abuse  -CIWA protocol  -history PEA so monitor on telemetry  -folic acid, thiamine and multivitamin  -SW consult  -appreciate PCP input tomorrow, Dr. Paulla Fore  -CIWA score overnight 4-->1 -Ativan x 7 through 8/23 - [ ]  f/u outpatient rehab decision  # Transaminitis: due to alcohol abuse. AST/ALT 2:1 as expected.   # Jeffrey: continue CPAP   # Hypertension  - BP 140's-160's/ 70's- 90's   - Continue amlodipine, lasix, cozaar, metoprolol, aspirin   # Diabetes Mellitus  -last a1c 9.5  -Lantus d/c'd, sensitive SSI only.  [ ]  Monitor CBGs   # Hyperlipidemia: continue pravastatin   # GERD: continue PPI   # Insomnia: continue trazodone prn   FEN/GI: carb modified diet, no IVF  Prophylaxis: Heparin   Disposition: Home or outpatient rehab pending SW  Subjective:  Patient reports feeling a little better today. He still gets shaking episodes before he takes the ativan. Still has no appetite but has been trying to force himself to eat, had an episode of vomiting yesterday but nothing overnight. The abdominal pain he got yesterday has gone away. He did start to have some diarrhea yesterday. Denies any further feeling of bugs crawling on him and no hallucinations. He is undecided about outpatient rehab, he says he needs to talk to his wife and the insurance company to see what the  costs will be.  Objective: Temp:  [98.5 F (36.9 C)-99 F (37.2 C)] 99 F (37.2 C) (08/23 0404) Pulse Rate:  [72-86] 77 (08/23 0404) Resp:  [16-21] 18 (08/23 0404) BP: (121-166)/(80-113) 150/88 mmHg (08/23 0404) SpO2:  [97 %-99 %] 98 % (08/23 0404) Weight:  [269 lb 13.5 oz (122.4 kg)] 269 lb 13.5 oz (122.4 kg) (08/23 0404) Physical Exam: General: NAD, laying in bed sleeping Cardiovascular: RRR, normal S1/S2, no M/R/G appreciated  Respiratory: CTAB, effort normal  Abdomen: bowel sounds present, nontender to palpation  Extremities: trace edema bilaterally   Laboratory:  Recent Labs Lab 05/26/13 1810  WBC 6.4  HGB 9.9*  HCT 28.7*  PLT 314    Recent Labs Lab 05/26/13 1810  NA 138  K 4.9  CL 94*  CO2 29  BUN 9  CREATININE 1.02  CALCIUM 8.6  PROT 8.1  BILITOT 0.5  ALKPHOS 124*  ALT 58*  AST 121*  GLUCOSE 122*    Recent Labs Lab 05/27/13 2143 05/28/13 0820 05/28/13 1228 05/28/13 1712 05/28/13 2200  GLUCAP 130* 92 161* 261* 210*   Imaging/Diagnostic Tests: None   Tawanna Sat, MD 05/29/2013, 6:55 AM PGY-1, [place] Intern pager: [phone], text pages welcome

## 2013-05-29 NOTE — Progress Notes (Signed)
Pt has said he can use the CPAP machine himself when he's ready to go to bed. Pt knows how to place the machine on and off as needed. Pt has also been informed to call if he does need any help at all with the machine. RT will continue to assist as needed.

## 2013-05-29 NOTE — Progress Notes (Signed)
Family Medicine Teaching Service Service Pager: [phone] PCP NOTE  Jeffrey [name] 47 y.o. male  MRN: [id number]  DOB: May 07, 1966   Primary Care Provider:   Teresa Coombs, DO    BRIEF PATIENT OVERVIEW   LOS: 3 days  47 y.o. year old male who is well-known to me from clinic.  He presented to clinic 3 days ago requesting to return to work.  He has continued to have ongoing sequela from anoxic brain injury suffered from PEA arrest, secondary to OSA and alcohol abuse.  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  They recommended that he have alcohol cessation as well Neurocognitive rehabilitation. He reported that he has tried stopping alcohol on his own in the past and has experienced significant withdrawal symptoms including tremulousness, diaphoresis, formication, and significant anxiety.  Due to his significant risk for recurrent cardiac events and potentials for significant delirium tremens it was recommended the patient undergo inpatient detox prior to outpatient/residential therapy.    In visiting with patient today he has reported all the above symptoms since hospitalization.  He is profoundly diaphoretic on exam today but reports his tremulousness has improved.  Today's conversation was with most coherent conversation I have ever had with the patient and he seems to comprehend and is able to accurately recall and repeat what we had discussed ; he never previously been able to do so.  I have recommended he undergo residential alcohol withdrawal.  He reports he has a strong desire to quit alcohol but does not feel like he can do it without help. He is willing to accept help. He is currently investigating if residential treatment is an option for him.  Given his significant diaphoresis, Ativan needs and CIWA scores (which are improving but we are only approaching 72 hours since his  last drink at this time).  I do think that continuing inpatient monitoring at this time is necessary especially given his elevated risk and remote PEA arrest, and markedly labile blood pressures.  I will be happy to see this patient in followup and encouraged him to continue with his neuropsych rehabilitation by speech therapy when he is able, but do feel that this is secondary to treatment for his alcohol dependence  I appreciate the excellent care by the inpatient team and will be available if there any further questions.   Gerda Diss, DO [name] Resident - PGY-2 05/29/2013 11:33 AM

## 2013-05-30 LAB — GLUCOSE, CAPILLARY
Glucose-Capillary: 352 mg/dL — ABNORMAL HIGH (ref 70–99)
Glucose-Capillary: 245 mg/dL — ABNORMAL HIGH (ref 70–99)
Glucose-Capillary: >600 mg/dL (ref 70–99)
Glucose-Capillary: 344 mg/dL — ABNORMAL HIGH (ref 70–99)

## 2013-05-30 MED ORDER — INSULIN ASPART 100 UNIT/ML ~~LOC~~ SOLN
15.0000 [IU] | Freq: Once | SUBCUTANEOUS | Status: AC
  Administered 2013-05-30: 15 [IU] via SUBCUTANEOUS

## 2013-05-30 MED ORDER — INSULIN GLARGINE 100 UNIT/ML ~~LOC~~ SOLN
20.0000 [IU] | Freq: Once | SUBCUTANEOUS | Status: AC
  Administered 2013-05-30: 20 [IU] via SUBCUTANEOUS
  Filled 2013-05-30: qty 0.2

## 2013-05-30 MED ORDER — INSULIN ASPART 100 UNIT/ML ~~LOC~~ SOLN
0.0000 [IU] | Freq: Three times a day (TID) | SUBCUTANEOUS | Status: DC
  Administered 2013-05-31: 8 [IU] via SUBCUTANEOUS
  Administered 2013-05-31: 2 [IU] via SUBCUTANEOUS

## 2013-05-30 NOTE — Progress Notes (Signed)
The PT knows how to put his CPAP machine on and off at his discretion. RT will assist as needed.

## 2013-05-30 NOTE — Progress Notes (Signed)
FMTS Attending Admission Note: Kehinde Eniola,MD I  have seen and examined this patient, reviewed their chart. I have discussed this patient with the resident. I agree with the resident's findings, assessment and care plan.  

## 2013-05-30 NOTE — Assessment & Plan Note (Addendum)
Patient has been in speech language therapy for [place] of remediation services.  He is been seen twice and was seen earlier this morning.  I've called and requested these records be faxed for further review.  I do think that he is still significantly impaired due to his anoxic brain injury but is convoluted by his alcohol abuse.  He is to remain out of work at this time. > If he is able to become sober and remain sober, considering an early reevaluation of his neuro psychologic status may be warranted for him to be able to return to work.

## 2013-05-30 NOTE — Assessment & Plan Note (Signed)
No chest pain at this time. He is approximately 36 hours out from his last drink and is likely having significant withdrawal symptoms.

## 2013-05-30 NOTE — Assessment & Plan Note (Addendum)
Per minute conversations in the past I have highly occurs the patient quit alcohol.  He reports being interested at this time and wanting to quit but he is not ready to be admitted to the hospital but recognizes he cannot do this on his own. I spent extensive time and counseling the patient he ultimately elected to go home with a high likelihood of requesting admission in the near future.  Given his significant tremulousness, hypertension, significant cardiac history the patient should be monitored an inpatient setting for acute detoxification then will likely need to go to outpatient versus residential rehabilitation.

## 2013-05-30 NOTE — Progress Notes (Signed)
Family Medicine Teaching Service Daily Progress Note Intern Pager: [phone]  Patient name: Jeffrey [name] Medical record number: [id number] Date of birth: Jul 04, 1966 Age: 47 y.o. Gender: male  Primary Care Provider: Teresa Coombs, DO Consultants: none  Code Status: Full   Pt Overview and Major Events to Date:  8/20 - CIWA score 0 8/22 - CIWA 5, Received 4 ativan to date  8/23 - CIWA max 4, ativan q6hrs  Assessment and Plan: Jeffrey [name] is a 47 y.o. year old male presenting for alcohol detoxification . 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 Paulla Fore  -CIWA score overnight 2-->0-->2 -Ativan: taking q6hrs, now taking less and none today so far - [ ]  f/u outpatient rehab decision, patient spoke with Dr. Paulla Fore yesterday  # Transaminitis: due to alcohol abuse. AST/ALT 2:1 as expected.   # OSA: continue CPAP   # Hypertension  - BP 140's-160's/ 70's- 90's   - Continue amlodipine, lasix, cozaar, metoprolol, aspirin   # Diabetes Mellitus  -last a1c 9.5  -Lantus restarted 10U daily, sensitive SSI.  [ ]  Monitor CBGs   # Hyperlipidemia: continue pravastatin   # GERD: continue PPI   # Insomnia: continue trazodone prn   FEN/GI: carb modified diet, no IVF  Prophylaxis: Heparin   Disposition: Home or outpatient rehab pending SW  Subjective:  Patient seen standing up and out of bed, bed sheets getting changed because they were wet from sweating while he slept. Says he is doing better, still has shakes but better. Has been sweating a lot. Denies nausea/vomiting, hallucinations. Started to get back pain last night, flexeril helped a little bit but still feels tight. He does complain of insomnia, longstanding, that the trazodone he had been  taking as outpatient never really helped and was wondering if we could give him something else.  Objective: Temp:  [98.6 F (37 C)-99.5 F (37.5 C)] 99.5 F (37.5 C) (08/24 0516) Pulse Rate:  [75-82] 79 (08/24 0516) Resp:  [18-20] 18 (08/24 0516) BP: (143-165)/(94-97) 155/94 mmHg (08/24 0516) SpO2:  [95 %-99 %] 95 % (08/24 0516) Weight:  [267 lb 13.7 oz (121.5 kg)] 267 lb 13.7 oz (121.5 kg) (08/24 0516) Physical Exam: General: NAD, standing up out of bed, back of gown wet Cardiovascular: RRR, normal S1/S2, no M/R/G appreciated  Respiratory: CTAB, effort normal  Abdomen: bowel sounds present, nontender to palpation  Extremities: trace edema bilaterally   Laboratory:  Recent Labs Lab 05/26/13 1810  WBC 6.4  HGB 9.9*  HCT 28.7*  PLT 314    Recent Labs Lab 05/26/13 1810  NA 138  K 4.9  CL 94*  CO2 29  BUN 9  CREATININE 1.02  CALCIUM 8.6  PROT 8.1  BILITOT 0.5  ALKPHOS 124*  ALT 58*  AST 121*  GLUCOSE 122*    Recent Labs Lab 05/29/13 0819 05/29/13 1159 05/29/13 1706 05/29/13 2129 05/30/13 0515  GLUCAP 195* 263* 351* 387* 338*   Imaging/Diagnostic Tests: None   Tawanna Sat, MD 05/30/2013, 8:04 AM PGY-1, [place] Intern pager: [phone], text pages welcome

## 2013-05-31 LAB — BASIC METABOLIC PANEL
Calcium: 9.1 mg/dL (ref 8.4–10.5)
Creatinine, Ser: 1.1 mg/dL (ref 0.50–1.35)
GFR calc Af Amer: >90 mL/min (ref >90)
GFR calc non Af Amer: 78 mL/min — ABNORMAL LOW (ref >90)
Sodium: 130 mEq/L — ABNORMAL LOW (ref 135–145)

## 2013-05-31 LAB — GLUCOSE, CAPILLARY: Glucose-Capillary: 252 mg/dL — ABNORMAL HIGH (ref 70–99)

## 2013-05-31 MED ORDER — LORAZEPAM 1 MG PO TABS: ORAL_TABLET | ORAL | Status: DC

## 2013-05-31 MED ORDER — FOLIC ACID 1 MG PO TABS: 1.0000 mg | ORAL_TABLET | Freq: Every day | ORAL | Status: DC

## 2013-05-31 MED ORDER — THIAMINE HCL 100 MG PO TABS: 100.0000 mg | ORAL_TABLET | Freq: Every day | ORAL | Status: DC

## 2013-05-31 MED ORDER — POTASSIUM CHLORIDE CRYS ER 20 MEQ PO TBCR
40.0000 meq | EXTENDED_RELEASE_TABLET | Freq: Two times a day (BID) | ORAL | Status: AC
  Administered 2013-05-31 (×2): 40 meq via ORAL
  Filled 2013-05-31 (×2): qty 2

## 2013-05-31 MED ORDER — INSULIN GLARGINE 100 UNIT/ML ~~LOC~~ SOLN: 10.0000 [IU] | Freq: Two times a day (BID) | SUBCUTANEOUS | Status: DC

## 2013-05-31 MED ORDER — ADULT MULTIVITAMIN W/MINERALS CH: 1.0000 | ORAL_TABLET | Freq: Every day | ORAL | Status: DC

## 2013-05-31 NOTE — Progress Notes (Signed)
Family Medicine Teaching Service Daily Progress Note Intern Pager: [phone]  Patient name: Jeffrey [name] Medical record number: [id number] Date of birth: April 12, 1966 Age: 47 y.o. Gender: male  Primary Care Provider: Teresa Coombs, DO Consultants: none  Code Status: Full   Pt Overview and Major Events to Date:  8/20 - CIWA score 0 8/22 - CIWA 5, Received 4 ativan to date  8/23 - CIWA max 4, ativan q6hrs 8/24 - CIWA max 3, last ativan 8/23 at 1220  Assessment and Plan: MUSIQ VEST is a 47 y.o. year old male presenting for alcohol detoxification . 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 Paulla Fore  -CIWA score overnight 3 (tremor/sweats/agitation) - Ativan transitioned to hydroxyzine yesterday - Patient's insurance will not cover outpatient rehab, but says his work will if he is active (currently on disability) and would like to go this route, though it will take time and there will be a lapse in detox care. He expresses interest in going back to work and says the cardiologist cleared him, but he still needs a letter from PCP. -SW provided list of local AA meeting schedule - Discharge home today with ?librium vs ativan   # Transaminitis: due to alcohol abuse. AST/ALT 2:1 as expected.   # OSA: continue CPAP   # Hypertension  - BP 140's-160's/ 70's- 90's   - Continue amlodipine, lasix, cozaar, metoprolol, aspirin   # Diabetes Mellitus  -last a1c 9.5  -Lantus restarted 10U daily, sensitive SSI.  -CBG 147 this AM  # Hyperlipidemia: continue pravastatin   # GERD: continue PPI   # Insomnia: continue trazodone prn   FEN/GI: carb modified diet, no IVF  Prophylaxis: Heparin   Disposition: discharge to home  Subjective:  Patient doing better today. Has been doing well without  ativan (d/c'd) yesterday and last night. Shakes better, he says he realized that his sweating was from the room temperature increasing. Denies any pain, fever/chills, nausea/vomiting. He has been eating much better. Says he feels okay to go home. He won't be able to immediately go to outpatient rehab as the costs are too much and his insurance won't cover it, however he says that his work does offer a program that he could participate in once he goes back to work (he is currently on disability). Back pain is better today.  Objective: Temp:  [98 F (36.7 C)-99 F (37.2 C)] 98.6 F (37 C) (08/25 0500) Pulse Rate:  [70-80] 80 (08/25 0500) Resp:  [18-20] 20 (08/25 0500) BP: (133-154)/(86-107) 133/86 mmHg (08/25 0500) SpO2:  [98 %-100 %] 99 % (08/25 0500) Weight:  [268 lb 15.4 oz (122 kg)] 268 lb 15.4 oz (122 kg) (08/25 0500) Physical Exam: General: NAD, standing up out of bed, back of gown wet Cardiovascular: RRR, normal S1/S2, no M/R/G appreciated  Respiratory: CTAB, effort normal  Back: less tender than yesterday, doesn't flinch when palpating Abdomen: bowel sounds present, nontender to palpation  Extremities: trace edema bilaterally   Laboratory:  Recent Labs Lab 05/26/13 1810  WBC 6.4  HGB 9.9*  HCT 28.7*  PLT 314    Recent Labs Lab 05/26/13 1810 05/30/13 2359  NA 138 130*  K 4.9 3.2*  CL 94* 88*  CO2 29 31  BUN 9 10  CREATININE 1.02 1.10  CALCIUM  8.6 9.1  PROT 8.1  --   BILITOT 0.5  --   ALKPHOS 124*  --   ALT 58*  --   AST 121*  --   GLUCOSE 122* 249*    Recent Labs Lab 05/30/13 0818 05/30/13 1146 05/30/13 1713 05/30/13 2102 05/30/13 2201  GLUCAP 281* 352* 245* >600* 344*   Imaging/Diagnostic Tests: None   Tawanna Sat, MD 05/31/2013, 7:24 AM PGY-1, [place] Intern pager: [phone], text pages welcome

## 2013-05-31 NOTE — Progress Notes (Signed)
05/31/2013 1400 No CM needs identified. [name] RN CCM Case Mgmt phone [phone]

## 2013-05-31 NOTE — Progress Notes (Signed)
FMTS Attending Daily Note:  Annabell Sabal MD  [phone] pager  Family Practice pager:  [phone] I have seen and examined this patient and have reviewed their chart. I have discussed this patient with the resident. I agree with the resident's findings, assessment and care plan.  Additionally:  Patient has shown marked improvement from admission.  Declines inpatient/outpatient rehab secondary to insurance and financial issues.  Is ready to go back to work.  No further evidence of DTs or withdrawal.  Conversant, pleasant, no tachycardia.   No CIWA needs. Plan for DC home today with taper of Ativan as needed.   Alveda Reasons, MD 05/31/2013 4:09 PM

## 2013-05-31 NOTE — Progress Notes (Signed)
Patient discharge teaching given, including activity, diet, follow-up appoints, and medications. Patient verbalized understanding of all discharge instructions. IV access was d/c'd. Vitals are stable. Skin is intact except as charted in most recent assessments. Pt was escorted out by NT, to be driven home by family.

## 2013-06-01 NOTE — Discharge Summary (Addendum)
[facility] Discharge Summary  Patient name: Jeffrey [name] Medical record number: [id number] Date of birth: 1965/10/23 Age: 47 y.o. Gender: male Date of Admission: 05/26/2013  Date of Discharge: 05/31/2013 Admitting Physician: Dickie La, MD  Primary Care Provider: Teresa Coombs, DO Consultants: none  Indication for Hospitalization: Alcohol detox  Discharge Diagnoses/Problem List:  Alcohol withdrawal and detox Diabetes mellitus Obstructive Sleep Apnea Hypertension Hyperlipidemia GERD Insomnia  Disposition: discharge to home  Discharge Condition: Stable  Discharge Exam:  BP 152/105  Pulse 72  Temp(Src) 98.7 F (37.1 C) (Oral)  Resp 20  Ht 5\' 10"  (1.778 m)  Wt 268 lb 15.4 oz (122 kg)  BMI 38.59 kg/m2  SpO2 98% General: NAD, standing up out of bed, back of gown wet  Cardiovascular: RRR, normal S1/S2, no M/R/G appreciated  Respiratory: CTAB, effort normal  Abdomen: bowel sounds present, nontender to palpation  Extremities: trace edema bilaterally  Neuro: no tremors  Brief Hospital Course: Jeffrey [name] is a 47 y.o. year old male presented for alcohol detoxification. He was on CIWA protocol throughout hospital duration, requiring ativan and hydroxyzine. At discharge he was in stable condition with no evidence of alcohol withdrawal. 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   # Alcohol detox and alcohol withdrawal:  - CIWA protocol throughout hospital stay. Max scores were as follows: Day 1: 2, Day 2: 9, Day 3: 4, Day 4: 2, Day 5: 2 but on discharge 0. Patient required ativan every 8 hours for the first 2 days, decreased by day 3 and transitioned to hydroxyzine on day 4. SW consulted and discussed with patient about outpatient rehab, but his insurance would not cover it and he cannot afford it. He was provided with a list of local AA meetings and strongly urged to  attend. He plans on going back to work as his work provides him with rehab support. He was discharged home with an ativan taper for the next 3 days.  # Hypertension  - BP 140's-170's/ 70's- 110's throughout hospital course - Continued home meds amlodipine, lasix, cozaar, metoprolol. No changes made.  # Diabetes Mellitus - Patient initially had several hypoglycemic episodes during hospital stay attributed to poor oral intake on initial presentation. No problems after patient's appetite improved, was kept on half home lantus dose and SSI. # Insomnia: continued on home trazodone. Patient says this has never worked for him, this was not addressed during hospitalization.   Issues for Follow Up: 1. Alcohol abuse: recommend outpatient rehab 2. Diabetes management 3. Hypertension 4. Insomnia   Significant Procedures: none  Significant Labs and Imaging:   Recent Labs Lab 05/26/13 1810  WBC 6.4  HGB 9.9*  HCT 28.7*  PLT 314    Recent Labs Lab 05/26/13 1810 05/30/13 2359  NA 138 130*  K 4.9 3.2*  CL 94* 88*  CO2 29 31  GLUCOSE 122* 249*  BUN 9 10  CREATININE 1.02 1.10  CALCIUM 8.6 9.1  ALKPHOS 124*  --   AST 121*  --   ALT 58*  --   ALBUMIN 3.1*  --    Results/Tests Pending at Time of Discharge: none  Discharge Medications:    Medication List    STOP taking these medications       penicillin v potassium 500 MG tablet  Commonly known as:  VEETID      TAKE these medications  amLODipine 10 MG tablet  Commonly known as:  NORVASC  Take 1 tablet (10 mg total) by mouth daily.     aspirin 81 MG EC tablet  Commonly known as:  ADULT ASPIRIN EC LOW STRENGTH  Take 1 tablet (81 mg total) by mouth daily.     folic acid 1 MG tablet  Commonly known as:  FOLVITE  Take 1 tablet (1 mg total) by mouth daily.     furosemide 20 MG tablet  Commonly known as:  LASIX  Take 1 tablet (20 mg total) by mouth daily.     insulin glargine 100 UNIT/ML injection  Commonly known  as:  LANTUS  Inject 0.1 mLs (10 Units total) into the skin 2 (two) times daily.     insulin lispro 100 UNIT/ML Soct  Commonly known as:  HUMALOG  Inject 5-10 Units into the skin 3 (three) times daily with meals. 5 units + 1 Unit for each 50 above 200  >400=5units and call doctor     LORazepam 1 MG tablet  Commonly known as:  ATIVAN  Take 1 pill every 12 hours on 8/25, then 1/2 a pill every 8 hours on 8/27, then 1/2 a pill every 12 hours on 8/28, then just 1/2 a pill on 8/29 then stop     losartan 100 MG tablet  Commonly known as:  COZAAR  Take 1 tablet (100 mg total) by mouth daily.     metoprolol succinate 100 MG 24 hr tablet  Commonly known as:  TOPROL-XL  Take 1 tablet (100 mg total) by mouth daily. Take with or immediately following a meal.     multivitamin with minerals Tabs tablet  Take 1 tablet by mouth daily.     pantoprazole 40 MG tablet  Commonly known as:  PROTONIX  Take 1 tablet (40 mg total) by mouth daily.     pravastatin 80 MG tablet  Commonly known as:  PRAVACHOL  Take 1 tablet (80 mg total) by mouth at bedtime. for Cholesterol     sildenafil 100 MG tablet  Commonly known as:  VIAGRA  Take 100 mg by mouth daily as needed for erectile dysfunction. Takes 100mg  up to four hours before intercourse     thiamine 100 MG tablet  Take 1 tablet (100 mg total) by mouth daily.     traZODone 50 MG tablet  Commonly known as:  DESYREL  Take 1 tablet (50 mg total) by mouth at bedtime as needed. sleep        Discharge Instructions: Please refer to Patient Instructions section of EMR for full details.  Patient was counseled important signs and symptoms that should prompt return to medical care, changes in medications, dietary instructions, activity restrictions, and follow up appointments.   Follow-Up Appointments:     Follow-up Information   Follow up with RIGBY, MICHAEL, DO On 06/04/2013. (10:15 am)    Specialty:  Family Medicine   Contact information:   [address]  [place] [address] [phone]       Tawanna Sat, MD 06/01/2013, 1:55 PM PGY-1, [place]

## 2013-06-02 ENCOUNTER — Ambulatory Visit: Payer: Managed Care, Other (non HMO)

## 2013-06-04 ENCOUNTER — Ambulatory Visit (INDEPENDENT_AMBULATORY_CARE_PROVIDER_SITE_OTHER): Payer: Managed Care, Other (non HMO) | Admitting: Sports Medicine

## 2013-06-04 ENCOUNTER — Encounter: Payer: Self-pay | Admitting: Sports Medicine

## 2013-06-04 VITALS — BP 164/103 | HR 68 | Temp 98.7°F | Ht 70.0 in | Wt 207.3 lb

## 2013-06-04 DIAGNOSIS — G931 Anoxic brain damage, not elsewhere classified: Secondary | ICD-10-CM

## 2013-06-04 DIAGNOSIS — G4726 Circadian rhythm sleep disorder, shift work type: Secondary | ICD-10-CM

## 2013-06-04 DIAGNOSIS — I10 Essential (primary) hypertension: Secondary | ICD-10-CM

## 2013-06-04 DIAGNOSIS — F1021 Alcohol dependence, in remission: Secondary | ICD-10-CM

## 2013-06-04 DIAGNOSIS — G4733 Obstructive sleep apnea (adult) (pediatric): Secondary | ICD-10-CM

## 2013-06-04 DIAGNOSIS — E119 Type 2 diabetes mellitus without complications: Secondary | ICD-10-CM

## 2013-06-04 LAB — POCT GLYCOSYLATED HEMOGLOBIN (HGB A1C): Hemoglobin A1C: 7.1

## 2013-06-04 MED ORDER — INSULIN LISPRO 100 UNIT/ML CARTRIDGE: 5.0000 [IU] | Freq: Three times a day (TID) | SUBCUTANEOUS | Status: DC

## 2013-06-04 MED ORDER — LORAZEPAM 1 MG PO TABS: 1.0000 mg | ORAL_TABLET | Freq: Every day | ORAL | Status: DC

## 2013-06-04 MED ORDER — TRAZODONE HCL 100 MG PO TABS: 100.0000 mg | ORAL_TABLET | Freq: Every day | ORAL | Status: DC

## 2013-06-04 MED ORDER — INSULIN GLARGINE 100 UNIT/ML ~~LOC~~ SOLN: 30.0000 [IU] | Freq: Two times a day (BID) | SUBCUTANEOUS | Status: DC

## 2013-06-04 NOTE — Progress Notes (Signed)
  [facility]  Patient name: Jeffrey [name] MRN [id number]  Date of birth: 06/27/1966  CC & HPI:  Jeffrey [name] is a 47 y.o. male presenting to clinic.  Chief Complaint  Patient presents with  . Medical Managment of Chronic Issues #  Hypertension - chronic problem, poorly controlled.    Home BP checks/ranges: not checking   no orthostasis,   no peripheral edema  no chest pain, no dyspnea on exertion, no orthopnea/PND  no episodes of unilateral weakness, dysarthria or acute visual changes  #  Diabetes - chronic problem, marginally controlled.   Blood Sugar checks/ranges: 70s to 230s - A1c significantly improved today   no hypoglycemic symptoms/episodes.   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  He reports he has been released from their care due to his significant improvement.    He is insistent on going back to work.  Does not recall her conversation about him been out of work for 6 months per Dr. Monico Hoar recommendations  He does report been sober since being discharged from the hospital recently  Expresses significant frustration by the whole situation and is irritating that is taking so long  # Sleep disorder with history of shift work: Patient reports significant difficulty with maintaining a regular sleep pattern.  He notices that when he is been taking the Ativan provided at the hospital he seemed to sleep better and getting tired last, feeling rested in the morning.  TLC Compliance Diet: compliant most of the time  Exercise: noncompliant much of the time  MEDS: compliant all of the time        ROS:  PER HPI  Pertinent History Reviewed:  Medical &  Surgical Hx:  Reviewed: Significant for see above Medications: Reviewed & Updated - see associated section Social History: Reviewed -  reports that he has never smoked. He does not have any smokeless tobacco history on file.  Objective Findings:  Vitals: BP 164/103  Pulse 68  Temp(Src) 98.7 F (37.1 C) (Oral)  Ht 5\' 10"  (1.778 m)  Wt 207 lb 4.8 oz (94.031 kg)  BMI 29.74 kg/m2 PE: GENERAL:  adult obese AA male. In no discomfort; no respiratory distress  PSYCH:  alert and appropriate, good insight, cognition significantly clearer than in the past.  He does not perseverate on topics.    HNEENT:   extraocular muscles intact moist mucous membranes  CARDIO:  RRR, S1/S2 heard, no murmur  LUNGS:  CTA B, no wheezes, no crackles  ABDOMEN:    EXTREM:   GU:   SKIN:   NEUROMSK:  no does coordination there , no shakiness,     Assessment & Plan:   1. DM type 2, not at goal    See problem associated charting

## 2013-06-05 NOTE — Discharge Summary (Signed)
Family Medicine Teaching Service  Discharge Note : Attending Jeff Dalicia Kisner MD Pager [phone] Inpatient Team Pager:  [phone]  I have reviewed this patient and the patient's chart and have discussed discharge planning with the resident at the time of discharge. I agree with the discharge plan as above.    

## 2013-06-08 ENCOUNTER — Inpatient Hospital Stay: Payer: Managed Care, Other (non HMO) | Admitting: Sports Medicine

## 2013-06-09 ENCOUNTER — Ambulatory Visit: Payer: Managed Care, Other (non HMO)

## 2013-06-09 ENCOUNTER — Telehealth: Payer: Self-pay | Admitting: Sports Medicine

## 2013-06-09 NOTE — Telephone Encounter (Signed)
Related message,patient states he already spoke with him and has an upcoming appointment this Friday. Espn Zeman, [name]

## 2013-06-09 NOTE — Assessment & Plan Note (Signed)
Patient is insistent on returning to work.  I have requested records from speech language pathologist who reports he is doing significantly better and is released him to work with a "shadow".  Given Dr. Monico Hoar recommendations to be out of work for 6 months, I will touch base with him before being able to release Jeffrey [name] back to work.  I discussed this may require additional neuropsychological evaluation.

## 2013-06-09 NOTE — Assessment & Plan Note (Signed)
Patient has been compliant on his CPAP machine and has been doing well with a low dose of Ativan prior to that.  I have refilled his trazodone and given him a short course prescription for Ativan.  This is not to be used on a regular basis and he is aware of this.

## 2013-06-09 NOTE — Assessment & Plan Note (Signed)
Patient has been compliant.

## 2013-06-09 NOTE — Assessment & Plan Note (Signed)
A1c remarkably improved.  This is likely due to his increased medication compliance has been out of work. Encouraged to continue checking his sugars on a regular basis.  This will be followed up his insulin will be adjusted if he continues his TLC and remains sober.

## 2013-06-09 NOTE — Assessment & Plan Note (Signed)
Elevated today.  He is not taking his medications as morning.  He is irritated and aggravated due to his been out of work. Followup at next office visit.  No red flags.

## 2013-06-09 NOTE — Assessment & Plan Note (Signed)
Vision is currently sober.  He is now interested in going to [facility] or having counseling for his alcoholism.  He reports he is "done" and not planning on going back

## 2013-06-09 NOTE — Telephone Encounter (Signed)
I have been in contact with Dr. Sherian Maroon regarding re-evaluation for return to work.  Dr. Sherian Maroon will contact the patient to re-evaluate the patient ASAP for a hopeful return to work hopefully within the next 2 weeks.  Please call and inform pt that he should be receiving a call from Dr. Erby Pian office and that I have personally discussed Larrell's case with Dr. Sherian Maroon

## 2013-06-09 NOTE — Telephone Encounter (Signed)
Great that is sooner than I  was expecting and am thrilled that he will be able to get in as soon as possible.

## 2013-06-11 DIAGNOSIS — Z8674 Personal history of sudden cardiac arrest: Secondary | ICD-10-CM

## 2013-06-11 DIAGNOSIS — R41840 Attention and concentration deficit: Secondary | ICD-10-CM

## 2013-06-11 DIAGNOSIS — G9340 Encephalopathy, unspecified: Secondary | ICD-10-CM

## 2013-06-14 ENCOUNTER — Telehealth: Payer: Self-pay | Admitting: Sports Medicine

## 2013-06-14 NOTE — Telephone Encounter (Signed)
Patient has done remarkably well.  He has been reevaluated by Dr. Valentina Shaggy who has deemed Jeffrey [name] capable of returning to work at full capacity with a shadow for the initial 2 weeks.  I called and spoke with Tyjohn about this and will provide a letter stating so.  I will leave this in the front office.  He reports he continues to be sober.  He is checking his sugars 4 times per day.  He is not having any hypoglycemic episodes.  His hemoglobin A1c on last check was significantly improved to 7.1.  He's had a significant weight loss since his incident and February.  I believe that overall he is doing markedly better than he was previously and he agreed with this. 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

## 2013-06-16 NOTE — Discharge Summary (Signed)
Family Medicine Teaching Service  Discharge Note : Attending Jeff Kamoni Depree MD Pager [phone] Inpatient Team Pager:  [phone]  I have reviewed this patient and the patient's chart and have discussed discharge planning with the resident at the time of discharge. I agree with the discharge plan as above.    

## 2013-07-14 ENCOUNTER — Telehealth: Payer: Self-pay | Admitting: Sports Medicine

## 2013-07-14 NOTE — Telephone Encounter (Signed)
Pt called and would like refills on his lantus, pens, and syringe's call in to the [facility] on [address]

## 2013-07-15 MED ORDER — INSULIN PEN NEEDLE 31G X 5 MM MISC: Status: DC

## 2013-07-15 MED ORDER — INSULIN GLARGINE 100 UNIT/ML ~~LOC~~ SOLN: 30.0000 [IU] | Freq: Two times a day (BID) | SUBCUTANEOUS | Status: DC

## 2013-07-15 NOTE — Telephone Encounter (Signed)
[facility] Corrected and sent to [facility] on [address]

## 2013-07-15 NOTE — Telephone Encounter (Signed)
Sent into [facility]

## 2013-07-20 ENCOUNTER — Telehealth: Payer: Self-pay | Admitting: Sports Medicine

## 2013-07-20 NOTE — Telephone Encounter (Signed)
Pt called and would like enough medication to last until his appointment 10/31. 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 He has to get all new prescriptions. He really needs his lantus and metoprolol. JW

## 2013-07-20 NOTE — Telephone Encounter (Signed)
LVM for patient to call back. ?

## 2013-07-20 NOTE — Telephone Encounter (Signed)
What pharmacy does he want them sent to.  If it was his older one they should have his remaining prescriptions

## 2013-07-21 NOTE — Telephone Encounter (Signed)
LVM for patient to call back. To inform us of what pharmacy to send rx's to

## 2013-07-28 ENCOUNTER — Telehealth: Payer: Self-pay | Admitting: *Deleted

## 2013-07-28 DIAGNOSIS — E119 Type 2 diabetes mellitus without complications: Secondary | ICD-10-CM

## 2013-07-28 DIAGNOSIS — I10 Essential (primary) hypertension: Secondary | ICD-10-CM

## 2013-07-28 MED ORDER — METOPROLOL SUCCINATE ER 100 MG PO TB24: 100.0000 mg | ORAL_TABLET | Freq: Every day | ORAL | Status: DC

## 2013-07-28 MED ORDER — "INSULIN SYRINGE-NEEDLE U-100 30G X 1/2"" 0.3 ML MISC": Status: DC

## 2013-07-28 NOTE — Telephone Encounter (Signed)
Prescriptions sent to pharmacy

## 2013-07-28 NOTE — Telephone Encounter (Signed)
Patient came in to office today requesting refill of metoprolol XL and Rx for insulin syringes (1/2 inch Ultrafine).  Normally uses Lantus insulin pen, but out of it and currently has #2 vials of Lantus.  Needs Rx sent to [facility].  Insurance changed at work and will be using mail order.  Will bring mail order info to followup appt next Friday with Dr. Paulla Fore.  Will route request to  Dr. Maricela Bo (covering for [facility]).  Nolene Ebbs, RN

## 2013-07-29 NOTE — Telephone Encounter (Signed)
Patient informed that meds refilled.  Nolene Ebbs, RN

## 2013-08-04 ENCOUNTER — Ambulatory Visit: Payer: Managed Care, Other (non HMO) | Admitting: Pulmonary Disease

## 2013-08-06 ENCOUNTER — Encounter: Payer: Self-pay | Admitting: Sports Medicine

## 2013-08-06 ENCOUNTER — Ambulatory Visit (INDEPENDENT_AMBULATORY_CARE_PROVIDER_SITE_OTHER): Payer: BC Managed Care – PPO | Admitting: Sports Medicine

## 2013-08-06 VITALS — BP 146/78 | HR 65 | Temp 97.8°F | Ht 70.0 in | Wt 280.4 lb

## 2013-08-06 DIAGNOSIS — I872 Venous insufficiency (chronic) (peripheral): Secondary | ICD-10-CM

## 2013-08-06 DIAGNOSIS — G931 Anoxic brain damage, not elsewhere classified: Secondary | ICD-10-CM

## 2013-08-06 DIAGNOSIS — G4733 Obstructive sleep apnea (adult) (pediatric): Secondary | ICD-10-CM

## 2013-08-06 DIAGNOSIS — Z23 Encounter for immunization: Secondary | ICD-10-CM

## 2013-08-06 DIAGNOSIS — I10 Essential (primary) hypertension: Secondary | ICD-10-CM

## 2013-08-06 DIAGNOSIS — F1021 Alcohol dependence, in remission: Secondary | ICD-10-CM

## 2013-08-06 DIAGNOSIS — E119 Type 2 diabetes mellitus without complications: Secondary | ICD-10-CM

## 2013-08-06 MED ORDER — INSULIN LISPRO 100 UNIT/ML CARTRIDGE: 5.0000 [IU] | Freq: Three times a day (TID) | SUBCUTANEOUS | Status: DC

## 2013-08-06 MED ORDER — INSULIN GLARGINE 100 UNIT/ML ~~LOC~~ SOLN: 35.0000 [IU] | Freq: Two times a day (BID) | SUBCUTANEOUS | Status: DC

## 2013-08-06 NOTE — Assessment & Plan Note (Signed)
Patient appears to be mildly volume overloaded today.  Blood pressure is slightly improved on recheck to 146/78.   Given his significant fluid intake (this seems to be psychogenic as opposed to related to his diabetes) I am going to have him cut back significantly and continue his Lasix.  Also a little unsure as to what medications he is exactly on and have asked him to bring all medications with him to his next appointment.   Given the volume overload will anticipate improvement in blood pressure control with decreased by mouth intake.  Otherwise followup in one month.  He does have followup with Dr. Burt Knack coming up as well and I open to any adjustments that he feels are necessary, but once again I'm not sure if he has all of his medications but have provided refills today.

## 2013-08-06 NOTE — Assessment & Plan Note (Signed)
Seems to be much improved now that he is sober.  Very appreciative of being back at work.

## 2013-08-06 NOTE — Assessment & Plan Note (Signed)
Currently appears sober and reports 100% sobriety

## 2013-08-06 NOTE — Assessment & Plan Note (Signed)
Prescription for compression Socks provided today Encouraged to wear on a daily basis and to place prior to being on his feet to improve his compliance.

## 2013-08-06 NOTE — Patient Instructions (Signed)
   Cut back on your water to no more than 1-2 gallons perday  Follow up with Dr. Burt Knack (Cardiology)  & Pulmonology  Decrease Lantus to 35 Units twice a day, 5 Units Hamalog with each meal  Compression Socks  I will send in new prescriptions to your new pharmacy   If you need anything prior to your next visit please call the clinic. Please Bring all medications or accurate medication list with you to each appointment; an accurate medication list is essential in providing you the best care possible.

## 2013-08-06 NOTE — Progress Notes (Signed)
[place] - 47 y.o. male MRN [id number]  Date of birth: 07-19-1966  CC, HPI, Interval History & ROS  Jeffrey [name] is here today to followup on his chronic medical conditions including:  DM, HTN, HLD, OSA s/p cardiorespiratory arrest, now treated with CPAP, Anoxic Brain Injury s/p improvement, Hx of EtOH abuse   He reports Drinking a lot of water.  4+ gallons due to preceding this is healthy for him.  He denies any significant increase in his thirst but has obvious increase in urinary output  Checking 4 times per day - 295 highest, lowest 56 (random)  40 Twice a Day + Humalog 3-5 Units  Reports having cardiology and pulmonology followup   Last A1c 7.1  Insurance has changed recently and has been out of the couple of his medicines but he is unsure as to which ones.  Pt denies chest pain, dyspnea at rest or exertion, PND, lower extremity edema.  Patient denies any facial asymmetry, unilateral weakness, or dysarthria.  Reports compliance with his CPAP    has remained sober  Returning to work has been very good for him he feels much better and is very appreciative of the care from all the members involved and his team following his PEA arrest  Other acute problems include:  lower extremity swelling notes that after the end of a long day his feet are aching especially after being in steel toed boots.  Denies any foot lesions.  The swelling goes down after he elevates his feet.  Pertinent History & Care Coordination  No Patient Care Coordination Note on file.  History  Smoking status  . Never Smoker   Smokeless tobacco  . Not on file   Health Maintenance Due  Topic  . Influenza Vaccine     Recent Labs  12/23/12 1326 12/23/12 1414 01/24/13 1201 05/26/13 1810 06/04/13 1020  HGBA1C 7.6  --  9.5*  --  7.1  TSH  --  0.679  --  1.597  --      Otherwise past Medical, Surgical, Social, and Family History Reviewed per EMR Medications and  Allergies reviewed and all updated if necessary. Objective Findings  VITALS: HR: 65 bpm  BP: 146/78 mmHg  TEMP: 97.8 F (36.6 C) (Oral)  RESP:    HT: 5\' 10"  (177.8 cm)  WT: 280 lb 6.4 oz (127.189 kg)  BMI: 40.3   BP Readings from Last 3 Encounters:  08/06/13 146/78  06/04/13 164/103  05/31/13 152/105   Wt Readings from Last 3 Encounters:  08/06/13 280 lb 6.4 oz (127.189 kg)  06/04/13 207 lb 4.8 oz (94.031 kg)  05/31/13 268 lb 15.4 oz (122 kg)     PHYSICAL EXAM: GENERAL:  adult [place] American  male. In no discomfort; no respiratory distress  PSYCH: alert and appropriate, good insight   HNEENT:  mild JVD   CARDIO: RRR, S1/S2 heard, no murmur  LUNGS: CTA B, no wheezes, no crackles  ABDOMEN:  obese  EXTREM:  Warm, well perfused.  There are chronic venous stasis changes along the lateral aspect of his bilateral shins in the area of where his work boots rub.  There is no open lesion or surrounding erythema.  Moves all 4 extremities spontaneously; no lateralization.  No noted foot lesions.  Distal pulses 1+ out of 4.  2+ pretibial edema.  GU:   SKIN:     Assessment & Plan   Problems addressed today: General Plan & Pt Instructions:  1. Essential hypertension, benign   2. Anoxic brain injury   3. Personal history of alcoholism   4. DM type 2, not at goal   5. Need for prophylactic vaccination and inoculation against influenza   6. DM type 2 goal A1C below 7.5   7. Venous insufficiency   8. OSA on CPAP       Cut back on your water to no more than 1-2 gallons perday  Follow up with Dr. Burt Knack (Cardiology)  & Pulmonology  Decrease Lantus to 35 Units twice a day, 5 Units Hamalog with each meal  Compression Socks  I will send in new prescriptions to your new pharmacy     For further discussion of A/P and for follow up issues see problem based charting.

## 2013-08-06 NOTE — Assessment & Plan Note (Signed)
Reports good compliance has followup with Dr. Gwenette Greet next month

## 2013-08-06 NOTE — Assessment & Plan Note (Signed)
His last A1c was surprisingly at goal. Patient has been taking 40 units of Lantus twice a day.  I will have him decrease his to 35 units twice daily to 2 some symptomatic lows.  However I will have him increase his basal dosing to 5 units consistently with meals.

## 2013-08-09 ENCOUNTER — Ambulatory Visit: Payer: Managed Care, Other (non HMO) | Admitting: Pulmonary Disease

## 2013-08-12 ENCOUNTER — Encounter: Payer: Self-pay | Admitting: Pulmonary Disease

## 2013-08-23 ENCOUNTER — Telehealth: Payer: Self-pay | Admitting: *Deleted

## 2013-08-23 DIAGNOSIS — K922 Gastrointestinal hemorrhage, unspecified: Secondary | ICD-10-CM

## 2013-08-23 DIAGNOSIS — I10 Essential (primary) hypertension: Secondary | ICD-10-CM

## 2013-08-23 DIAGNOSIS — E119 Type 2 diabetes mellitus without complications: Secondary | ICD-10-CM

## 2013-08-23 MED ORDER — LOSARTAN POTASSIUM 100 MG PO TABS: 100.0000 mg | ORAL_TABLET | Freq: Every day | ORAL | Status: DC

## 2013-08-23 MED ORDER — FUROSEMIDE 20 MG PO TABS: 20.0000 mg | ORAL_TABLET | Freq: Every day | ORAL | Status: DC

## 2013-08-23 MED ORDER — SILDENAFIL CITRATE 100 MG PO TABS: 100.0000 mg | ORAL_TABLET | Freq: Every day | ORAL | Status: DC | PRN

## 2013-08-23 MED ORDER — PRAVASTATIN SODIUM 80 MG PO TABS: 80.0000 mg | ORAL_TABLET | Freq: Every day | ORAL | Status: DC

## 2013-08-23 MED ORDER — INSULIN GLARGINE 100 UNIT/ML ~~LOC~~ SOLN: 35.0000 [IU] | Freq: Two times a day (BID) | SUBCUTANEOUS | Status: DC

## 2013-08-23 MED ORDER — METOPROLOL SUCCINATE ER 100 MG PO TB24: 100.0000 mg | ORAL_TABLET | Freq: Every day | ORAL | Status: DC

## 2013-08-23 MED ORDER — PANTOPRAZOLE SODIUM 40 MG PO TBEC: 40.0000 mg | DELAYED_RELEASE_TABLET | Freq: Every day | ORAL | Status: DC

## 2013-08-23 MED ORDER — TRAZODONE HCL 100 MG PO TABS: 100.0000 mg | ORAL_TABLET | Freq: Every day | ORAL | Status: DC

## 2013-08-23 MED ORDER — INSULIN PEN NEEDLE 31G X 5 MM MISC: Status: DC

## 2013-08-23 MED ORDER — AMLODIPINE BESYLATE 10 MG PO TABS: 10.0000 mg | ORAL_TABLET | Freq: Every day | ORAL | Status: DC

## 2013-08-23 NOTE — Telephone Encounter (Signed)
Patient walked in today stating that his mail order pharmacy never received the lantus sent in at office visit on 10/31, meds resent. Patient also states that MD was to refill his amlodipine, protonix, pravachol, and viagra, needs these meds sent to his current mail order pharmacy (already loaded in epic) because with his new insurance [facility]). Will forward to PCP and Maricela Bo

## 2013-08-23 NOTE — Telephone Encounter (Signed)
Completed refills for requested medications and additional chronic meds that will need to be renewed in near future.

## 2013-11-02 ENCOUNTER — Telehealth: Payer: Self-pay | Admitting: Sports Medicine

## 2013-11-02 NOTE — Telephone Encounter (Signed)
Pt dropped off paperwork concerning FMLA to be filled out.

## 2013-11-02 NOTE — Telephone Encounter (Signed)
Paperwork was placed in Dr Nicolasa Ducking box to be completed. Jeffrey [name], Jeffrey [name]

## 2013-11-06 NOTE — Telephone Encounter (Signed)
Where is this paper work.  Not in my box???

## 2013-11-08 NOTE — Telephone Encounter (Signed)
Paperwork handed to Dr Paulla Fore. Jeffrey [name], Jeffrey [name]

## 2013-11-09 NOTE — Telephone Encounter (Signed)
Pt called about paperwork.  Needs it ASAP.  Deadline has passed

## 2013-11-11 ENCOUNTER — Telehealth: Payer: Self-pay | Admitting: Sports Medicine

## 2013-11-11 NOTE — Telephone Encounter (Signed)
Pt is calling checking on the status of paperwork that was dropped off. Please call when ready. jw

## 2013-11-11 NOTE — Telephone Encounter (Signed)
Left second message on patient's voicemail t to inform them that his supervisor as patient requested is  aware that Dr Paulla Fore as been out of the office.Once forms completed I would faxed them as well as leave copy for pick up. Levina Boyack, [name]

## 2013-11-11 NOTE — Telephone Encounter (Signed)
Discussed with Pt's wife and called and left message for [facility] supervisor [name] [phone] to clarify what this FMLA paperwork is for.  It is designated for future visits for "Asthma" which the patient does not have.  He does have other medical diagnoses that will require frequent doctors visits but given this isn't requested I asked Eryn to call back and clarify what is needed.

## 2013-11-15 NOTE — Telephone Encounter (Signed)
Jeffrey [name] stated to put Diabetes instead of the asthma on the forms. jw

## 2013-11-15 NOTE — Telephone Encounter (Signed)
[name] is calling back for Dr. Paulla Fore to inform him to state on the FMLA paper's that Jeffrey [name] doesn't have asthma and faxed them back to her at [phone]. jw

## 2013-11-15 NOTE — Telephone Encounter (Signed)
See other phone note

## 2013-11-15 NOTE — Telephone Encounter (Addendum)
Completed & given back to [facility] 11/16/13

## 2013-11-29 ENCOUNTER — Ambulatory Visit: Payer: BC Managed Care – PPO | Admitting: Sports Medicine

## 2013-12-13 ENCOUNTER — Ambulatory Visit: Payer: BC Managed Care – PPO | Admitting: Sports Medicine

## 2013-12-29 ENCOUNTER — Encounter: Payer: Self-pay | Admitting: Sports Medicine

## 2013-12-29 ENCOUNTER — Ambulatory Visit (INDEPENDENT_AMBULATORY_CARE_PROVIDER_SITE_OTHER): Payer: BC Managed Care – PPO | Admitting: Sports Medicine

## 2013-12-29 VITALS — BP 190/102 | HR 79 | Temp 98.1°F | Ht 70.0 in | Wt 268.1 lb

## 2013-12-29 DIAGNOSIS — G4733 Obstructive sleep apnea (adult) (pediatric): Secondary | ICD-10-CM

## 2013-12-29 DIAGNOSIS — E669 Obesity, unspecified: Secondary | ICD-10-CM

## 2013-12-29 DIAGNOSIS — F1021 Alcohol dependence, in remission: Secondary | ICD-10-CM

## 2013-12-29 DIAGNOSIS — I10 Essential (primary) hypertension: Secondary | ICD-10-CM

## 2013-12-29 DIAGNOSIS — E7800 Pure hypercholesterolemia, unspecified: Secondary | ICD-10-CM

## 2013-12-29 DIAGNOSIS — G931 Anoxic brain damage, not elsewhere classified: Secondary | ICD-10-CM

## 2013-12-29 DIAGNOSIS — Z9989 Dependence on other enabling machines and devices: Secondary | ICD-10-CM

## 2013-12-29 DIAGNOSIS — G4700 Insomnia, unspecified: Secondary | ICD-10-CM

## 2013-12-29 DIAGNOSIS — K922 Gastrointestinal hemorrhage, unspecified: Secondary | ICD-10-CM

## 2013-12-29 DIAGNOSIS — G4726 Circadian rhythm sleep disorder, shift work type: Secondary | ICD-10-CM

## 2013-12-29 DIAGNOSIS — E119 Type 2 diabetes mellitus without complications: Secondary | ICD-10-CM

## 2013-12-29 LAB — LIPID PANEL
Cholesterol: 155 mg/dL (ref 0–200)
HDL: 64 mg/dL (ref >39)
LDL Cholesterol: 74 mg/dL (ref 0–99)
Total CHOL/HDL Ratio: 2.4 Ratio
Triglycerides: 83 mg/dL (ref <150)
VLDL: 17 mg/dL (ref 0–40)

## 2013-12-29 LAB — CBC
HCT: 35.1 % — ABNORMAL LOW (ref 39.0–52.0)
Hemoglobin: 10.3 g/dL — ABNORMAL LOW (ref 13.0–17.0)
MCH: 20.8 pg — AB (ref 26.0–34.0)
MCHC: 29.3 g/dL — ABNORMAL LOW (ref 30.0–36.0)
MCV: 70.9 fL — ABNORMAL LOW (ref 78.0–100.0)
PLATELETS: 317 10*3/uL (ref 150–400)
RBC: 4.95 MIL/uL (ref 4.22–5.81)
RDW: 23.4 % — AB (ref 11.5–15.5)
WBC: 13.8 10*3/uL — AB (ref 4.0–10.5)

## 2013-12-29 LAB — POCT GLYCOSYLATED HEMOGLOBIN (HGB A1C): Hemoglobin A1C: 8

## 2013-12-29 LAB — COMPREHENSIVE METABOLIC PANEL
ALT: 29 U/L (ref 0–53)
AST: 41 U/L — ABNORMAL HIGH (ref 0–37)
Albumin: 4.1 g/dL (ref 3.5–5.2)
Alkaline Phosphatase: 77 U/L (ref 39–117)
BUN: 9 mg/dL (ref 6–23)
CO2: 25 meq/L (ref 19–32)
Calcium: 9.4 mg/dL (ref 8.4–10.5)
Chloride: 92 mEq/L — ABNORMAL LOW (ref 96–112)
Creat: 1.06 mg/dL (ref 0.50–1.35)
GLUCOSE: 283 mg/dL — AB (ref 70–99)
POTASSIUM: 4.4 meq/L (ref 3.5–5.3)
SODIUM: 134 meq/L — AB (ref 135–145)
TOTAL PROTEIN: 7.9 g/dL (ref 6.0–8.3)
Total Bilirubin: 0.8 mg/dL (ref 0.2–1.2)

## 2013-12-29 LAB — TSH: TSH: 2.02 u[IU]/mL (ref 0.350–4.500)

## 2013-12-29 NOTE — Progress Notes (Signed)
Jeffrey [name] - 48 y.o. male MRN [id number]  Date of birth: 07/04/1966  SUBJECTIVE:     CC: Medical Managment of Chronic Issues See problem based charting for additional subjective (including HPI, Interval History & ROS)   HISTORY:  Recent Labs  01/24/13 1201 05/26/13 1810 06/04/13 1020 12/29/13 1350 12/29/13 1425  HGBA1C 9.5*  --  7.1 8.0  --   TRIG  --   --   --   --  83  CHOL  --   --   --   --  155  HDL  --   --   --   --  64  LDLCALC  --   --   --   --  74  TSH  --  1.597  --   --  2.020  } Wt Readings from Last 3 Encounters:  12/29/13 268 lb 1.6 oz (121.609 kg)  08/06/13 280 lb 6.4 oz (127.189 kg)  06/04/13 207 lb 4.8 oz (94.031 kg)   BP Readings from Last 3 Encounters:  12/29/13 190/102  08/06/13 146/78  06/04/13 164/103    History  Smoking status  . Never Smoker   Smokeless tobacco  . Not on file   No health maintenance topics applied.  Otherwise past Medical, Surgical, Social, and Family History Reviewed per EMR Medications and Allergies reviewed and updated per below.  VITALS: BP 190/102  Pulse 79  Temp(Src) 98.1 F (36.7 C) (Oral)  Ht 5\' 10"  (1.778 m)  Wt 268 lb 1.6 oz (121.609 kg)  BMI 38.47 kg/m2  PHYSICAL EXAM: GENERAL: Adult obese african [place]  male. In no discomfort; no respiratory distress  PSYCH: alert and appropriate, good insight   HNEENT: No JVD  CARDIO: RRR, S1/S2 heard, no murmur  LUNGS: CTA B, no wheezes, no crackles  ABDOMEN:   EXTREM:  Warm, well perfused.  Moves all 4 extremities spontaneously; no lateralization.  No Distal pulses 2+/4.  no pretibial edema.  GU:   SKIN:     MEDICATIONS, LABS & OTHER ORDERS: Previous Medications   AMLODIPINE (NORVASC) 10 MG TABLET    Take 1 tablet (10 mg total) by mouth daily.   ASPIRIN (ADULT ASPIRIN EC LOW STRENGTH) 81 MG EC TABLET    Take 1 tablet (81 mg total) by mouth daily.   FUROSEMIDE (LASIX) 20 MG TABLET    Take 1 tablet (20 mg total) by mouth daily.   INSULIN PEN NEEDLE 31G X 5  MM MISC    BD Pen Needles- brand specific (Lantus) - Inject insulin via insulin pen daily   INSULIN SYRINGE-NEEDLE U-100 30G X 1/2" 0.3 ML MISC    Use twice a day.   LOSARTAN (COZAAR) 100 MG TABLET    Take 1 tablet (100 mg total) by mouth daily.   METOPROLOL SUCCINATE (TOPROL-XL) 100 MG 24 HR TABLET    Take 1 tablet (100 mg total) by mouth daily. Take with or immediately following a meal.   MULTIPLE VITAMIN (MULTIVITAMIN WITH MINERALS) TABS TABLET    Take 1 tablet by mouth daily.   PANTOPRAZOLE (PROTONIX) 40 MG TABLET    Take 1 tablet (40 mg total) by mouth daily.   PRAVASTATIN (PRAVACHOL) 80 MG TABLET    Take 1 tablet (80 mg total) by mouth at bedtime. for Cholesterol   SILDENAFIL (VIAGRA) 100 MG TABLET    Take 1 tablet (100 mg total) by mouth daily as needed for erectile dysfunction. Takes 100mg  up to four hours before intercourse  TRAZODONE (DESYREL) 100 MG TABLET    Take 1 tablet (100 mg total) by mouth at bedtime. sleep   Modified Medications   Modified Medication Previous Medication   INSULIN LISPRO (HUMALOG) 100 UNIT/ML CARTRIDGE insulin lispro (HUMALOG) 100 UNIT/ML SOCT      5 units + 1 Unit for each 50 above 200  >400=5units and call doctor    Inject 5 Units into the skin 3 (three) times daily with meals. 5 units + 1 Unit for each 50 above 200  >400=5units and call doctor   New Prescriptions   INSULIN GLARGINE (LANTUS SOLOSTAR) 100 UNIT/ML SOLOSTAR PEN    Inject 35 Units into the skin 2 (two) times daily.   Discontinued Medications   INSULIN GLARGINE (LANTUS) 100 UNIT/ML INJECTION    Inject 0.35 mLs (35 Units total) into the skin 2 (two) times daily.   LORAZEPAM (ATIVAN) 1 MG TABLET    Take 1 tablet (1 mg total) by mouth at bedtime.   THIAMINE 100 MG TABLET    Take 1 tablet (100 mg total) by mouth daily.   Orders Placed This Encounter  Procedures  . Comprehensive metabolic panel  . CBC  . TSH  . Lipid panel  . HgB A1c   ASSESSMENT & PLAN: See problem based charting & AVS  for pt instructions.

## 2013-12-29 NOTE — Patient Instructions (Signed)
I will send in new refills for your medicines. Please take them as soon as you get them.  Followup in 2-3 months with your insulin meter.  Wear your CPAP  Please call and schedule a followup appointment with your pulmonologist and cardiologist.  They are both wanting to see you.

## 2013-12-30 ENCOUNTER — Encounter: Payer: Self-pay | Admitting: Sports Medicine

## 2013-12-31 ENCOUNTER — Encounter: Payer: Self-pay | Admitting: Sports Medicine

## 2014-01-03 ENCOUNTER — Telehealth: Payer: Self-pay | Admitting: *Deleted

## 2014-01-03 MED ORDER — INSULIN GLARGINE 100 UNIT/ML SOLOSTAR PEN: 35.0000 [IU] | PEN_INJECTOR | Freq: Two times a day (BID) | SUBCUTANEOUS | Status: DC

## 2014-01-03 MED ORDER — INSULIN LISPRO 100 UNIT/ML CARTRIDGE: SUBCUTANEOUS | Status: DC

## 2014-01-03 NOTE — Assessment & Plan Note (Signed)
Problem Based Documentation:    Subjective Report:  Patient reports being sober although later in the conversation reports occasionally having a drink or 2 after work.     Assessment & Plan & Follow up Issues:  Chronic condition 1. Stressed importance of sobriety > Followup sobriety at every visit.  [name]

## 2014-01-03 NOTE — Assessment & Plan Note (Signed)
Problem Based Documentation:    Subjective Report:  Patient has not been wearing his CPAP on a regular basis.  Reports compliance 2/7 nights.  Typically fall asleep on the couch.     Assessment & Plan & Follow up Issues:  Chronic, undertreated condition 1. Encouraged daily/nightly CPAP use.  Stressed the importance of this and his overall disease management the patient recognized that his overall disease management was improved when using it regularly. 2. Recommend followup with pulmonology > Followup compliance

## 2014-01-03 NOTE — Telephone Encounter (Signed)
Message copied by Corinna Capra on Mon Jan 03, 2014  3:01 PM ------      Message from: Teresa Coombs D      Created: Mon Jan 03, 2014 11:51 AM       Please call him and let him know that he needs to call his Pharmacy to have them send out his refills.  [phone]  I called and spoke with them and sent in new refills for insulin; otherwise all rx available ------

## 2014-01-03 NOTE — Assessment & Plan Note (Addendum)
Problem Based Documentation:    Subjective Report:  Patient has been having an increasingly difficult time since returning for controlling his diabetes due to the irregularity of his schedule.  He reports to medications or EMS had been called due to hypoglycemic episodes are associated with taking his insulin and not eating.      Assessment & Plan & Follow up Issues:  Chronic, poorly controlled condition - Lability of sugars has been seen previously when he was drinking but denies heavy drinking vehemently although does admit to a couple of beers here and there. 1. No changes to regimen at this time given compliance and lability of sugars. 2. Concern for alcohol contributions to person and control although patient denies this at this time. 3. Recheck Risk labs today >  Encouraged improved insulin and mealtime planning .

## 2014-01-03 NOTE — Telephone Encounter (Signed)
Left message on patients voicemail.Jeffrey [name], [name]

## 2014-01-03 NOTE — Assessment & Plan Note (Signed)
Problem Based Documentation:    Subjective Report:  Patient reports overall doing well.  Has developed a coping mechanisms to deal with some minor residual deficits he has.     Assessment & Plan & Follow up Issues:  Chronic, improved condition 1. Back at work and performing well although under a significant amount stress. 2. No concerns with his cognition since stopping alcohol. >   .

## 2014-01-03 NOTE — Assessment & Plan Note (Signed)
Problem Based Documentation:    Subjective Report:  Pt denies chest pain, dyspnea at rest or exertion, PND, lower extremity edema.  Patient denies any facial asymmetry, unilateral weakness, or dysarthria.  Has been out of medications     Assessment & Plan & Follow up Issues:  Chronic, poorly controlled condition 1. Encouraged improve communication with his pharmacy. 2. Continue TLC (Therapeutic Lifestyle Changes) and treatment of OSA > Followup medication adherence  .

## 2014-01-04 ENCOUNTER — Inpatient Hospital Stay (HOSPITAL_COMMUNITY)
Admission: EM | Admit: 2014-01-04 | Discharge: 2014-01-10 | DRG: 637 | Disposition: A | Discharge status: Home / Self Care | Payer: BC Managed Care – PPO | Attending: Family Medicine | Admitting: Family Medicine

## 2014-01-04 ENCOUNTER — Encounter (HOSPITAL_COMMUNITY): Payer: Self-pay | Admitting: Emergency Medicine

## 2014-01-04 DIAGNOSIS — E1140 Type 2 diabetes mellitus with diabetic neuropathy, unspecified: Secondary | ICD-10-CM | POA: Diagnosis present

## 2014-01-04 DIAGNOSIS — Z9989 Dependence on other enabling machines and devices: Secondary | ICD-10-CM

## 2014-01-04 DIAGNOSIS — N179 Acute kidney failure, unspecified: Secondary | ICD-10-CM | POA: Diagnosis present

## 2014-01-04 DIAGNOSIS — M129 Arthropathy, unspecified: Secondary | ICD-10-CM | POA: Diagnosis present

## 2014-01-04 DIAGNOSIS — Z7982 Long term (current) use of aspirin: Secondary | ICD-10-CM

## 2014-01-04 DIAGNOSIS — Z9119 Patient's noncompliance with other medical treatment and regimen: Secondary | ICD-10-CM

## 2014-01-04 DIAGNOSIS — E875 Hyperkalemia: Secondary | ICD-10-CM | POA: Diagnosis present

## 2014-01-04 DIAGNOSIS — K226 Gastro-esophageal laceration-hemorrhage syndrome: Secondary | ICD-10-CM | POA: Diagnosis not present

## 2014-01-04 DIAGNOSIS — E1310 Other specified diabetes mellitus with ketoacidosis without coma: Principal | ICD-10-CM | POA: Diagnosis present

## 2014-01-04 DIAGNOSIS — E669 Obesity, unspecified: Secondary | ICD-10-CM | POA: Diagnosis present

## 2014-01-04 DIAGNOSIS — F1021 Alcohol dependence, in remission: Secondary | ICD-10-CM

## 2014-01-04 DIAGNOSIS — Z794 Long term (current) use of insulin: Secondary | ICD-10-CM

## 2014-01-04 DIAGNOSIS — E7800 Pure hypercholesterolemia, unspecified: Secondary | ICD-10-CM | POA: Diagnosis present

## 2014-01-04 DIAGNOSIS — G931 Anoxic brain damage, not elsewhere classified: Secondary | ICD-10-CM

## 2014-01-04 DIAGNOSIS — Z91199 Patient's noncompliance with other medical treatment and regimen due to unspecified reason: Secondary | ICD-10-CM

## 2014-01-04 DIAGNOSIS — Z8674 Personal history of sudden cardiac arrest: Secondary | ICD-10-CM

## 2014-01-04 DIAGNOSIS — E1110 Type 2 diabetes mellitus with ketoacidosis without coma: Secondary | ICD-10-CM | POA: Diagnosis present

## 2014-01-04 DIAGNOSIS — F411 Generalized anxiety disorder: Secondary | ICD-10-CM

## 2014-01-04 DIAGNOSIS — E785 Hyperlipidemia, unspecified: Secondary | ICD-10-CM | POA: Diagnosis present

## 2014-01-04 DIAGNOSIS — F1010 Alcohol abuse, uncomplicated: Secondary | ICD-10-CM | POA: Diagnosis present

## 2014-01-04 DIAGNOSIS — E119 Type 2 diabetes mellitus without complications: Secondary | ICD-10-CM

## 2014-01-04 DIAGNOSIS — G4733 Obstructive sleep apnea (adult) (pediatric): Secondary | ICD-10-CM | POA: Diagnosis present

## 2014-01-04 DIAGNOSIS — G4700 Insomnia, unspecified: Secondary | ICD-10-CM | POA: Diagnosis present

## 2014-01-04 DIAGNOSIS — N289 Disorder of kidney and ureter, unspecified: Secondary | ICD-10-CM | POA: Diagnosis present

## 2014-01-04 DIAGNOSIS — I872 Venous insufficiency (chronic) (peripheral): Secondary | ICD-10-CM | POA: Diagnosis present

## 2014-01-04 DIAGNOSIS — I10 Essential (primary) hypertension: Secondary | ICD-10-CM | POA: Diagnosis present

## 2014-01-04 LAB — COMPREHENSIVE METABOLIC PANEL
ALBUMIN: 4.2 g/dL (ref 3.5–5.2)
ALK PHOS: 99 U/L (ref 39–117)
ALT: 23 U/L (ref 0–53)
AST: 19 U/L (ref 0–37)
BILIRUBIN TOTAL: 0.7 mg/dL (ref 0.3–1.2)
BUN: 39 mg/dL — ABNORMAL HIGH (ref 6–23)
CO2: 7 mEq/L — CL (ref 19–32)
Calcium: 9.2 mg/dL (ref 8.4–10.5)
Chloride: 69 mEq/L — ABNORMAL LOW (ref 96–112)
Creatinine, Ser: 3.28 mg/dL — ABNORMAL HIGH (ref 0.50–1.35)
GFR calc Af Amer: 24 mL/min — ABNORMAL LOW (ref >90)
GFR calc non Af Amer: 21 mL/min — ABNORMAL LOW (ref >90)
Glucose, Bld: 861 mg/dL (ref 70–99)
POTASSIUM: 6.4 meq/L — AB (ref 3.7–5.3)
Sodium: 133 mEq/L — ABNORMAL LOW (ref 137–147)
TOTAL PROTEIN: 8.4 g/dL — AB (ref 6.0–8.3)

## 2014-01-04 LAB — GLUCOSE, CAPILLARY
Glucose-Capillary: 407 mg/dL — ABNORMAL HIGH (ref 70–99)
Glucose-Capillary: 332 mg/dL — ABNORMAL HIGH (ref 70–99)

## 2014-01-04 LAB — MRSA PCR SCREENING: MRSA by PCR: NEGATIVE

## 2014-01-04 LAB — BASIC METABOLIC PANEL
BUN: 38 mg/dL — ABNORMAL HIGH (ref 6–23)
BUN: 41 mg/dL — ABNORMAL HIGH (ref 6–23)
BUN: 40 mg/dL — ABNORMAL HIGH (ref 6–23)
CHLORIDE: 81 meq/L — AB (ref 96–112)
CHLORIDE: 87 meq/L — AB (ref 96–112)
CO2: 13 mEq/L — ABNORMAL LOW (ref 19–32)
CO2: 21 meq/L (ref 19–32)
CREATININE: 3.22 mg/dL — AB (ref 0.50–1.35)
Calcium: 8.3 mg/dL — ABNORMAL LOW (ref 8.4–10.5)
Calcium: 8.5 mg/dL (ref 8.4–10.5)
Calcium: 8.4 mg/dL (ref 8.4–10.5)
Chloride: 74 mEq/L — ABNORMAL LOW (ref 96–112)
Creatinine, Ser: 3.57 mg/dL — ABNORMAL HIGH (ref 0.50–1.35)
Creatinine, Ser: 3.54 mg/dL — ABNORMAL HIGH (ref 0.50–1.35)
GFR calc Af Amer: 22 mL/min — ABNORMAL LOW (ref >90)
GFR calc Af Amer: 24 mL/min — ABNORMAL LOW (ref >90)
GFR calc non Af Amer: 19 mL/min — ABNORMAL LOW (ref >90)
GFR calc non Af Amer: 21 mL/min — ABNORMAL LOW (ref >90)
GFR, EST AFRICAN AMERICAN: 22 mL/min — AB (ref >90)
GFR, EST NON AFRICAN AMERICAN: 19 mL/min — AB (ref >90)
GLUCOSE: 766 mg/dL — AB (ref 70–99)
GLUCOSE: 590 mg/dL — AB (ref 70–99)
Glucose, Bld: 350 mg/dL — ABNORMAL HIGH (ref 70–99)
POTASSIUM: 6.4 meq/L — AB (ref 3.7–5.3)
POTASSIUM: 5 meq/L (ref 3.7–5.3)
Potassium: 4.3 mEq/L (ref 3.7–5.3)
SODIUM: 136 meq/L — AB (ref 137–147)
Sodium: 138 mEq/L (ref 137–147)
Sodium: 144 mEq/L (ref 137–147)

## 2014-01-04 LAB — CBG MONITORING, ED
Glucose-Capillary: >600 mg/dL (ref 70–99)
Glucose-Capillary: >600 mg/dL (ref 70–99)

## 2014-01-04 LAB — CBC
HCT: 38.3 % — ABNORMAL LOW (ref 39.0–52.0)
Hemoglobin: 11.1 g/dL — ABNORMAL LOW (ref 13.0–17.0)
MCH: 21.7 pg — ABNORMAL LOW (ref 26.0–34.0)
MCHC: 29 g/dL — ABNORMAL LOW (ref 30.0–36.0)
MCV: 74.8 fL — ABNORMAL LOW (ref 78.0–100.0)
PLATELETS: 384 10*3/uL (ref 150–400)
RBC: 5.12 MIL/uL (ref 4.22–5.81)
RDW: 24.2 % — AB (ref 11.5–15.5)
WBC: 17.6 10*3/uL — ABNORMAL HIGH (ref 4.0–10.5)

## 2014-01-04 LAB — TROPONIN I
Troponin I: <0.3 ng/mL (ref <0.30)
Troponin I: <0.3 ng/mL (ref <0.30)

## 2014-01-04 LAB — GASTRIC OCCULT BLOOD (1-CARD TO LAB)

## 2014-01-04 LAB — LIPASE, BLOOD: Lipase: 99 U/L — ABNORMAL HIGH (ref 11–59)

## 2014-01-04 LAB — POCT GASTRIC OCCULT BLOOD (1-CARD TO LAB): Occult Blood, Gastric: POSITIVE — AB

## 2014-01-04 LAB — ETHANOL

## 2014-01-04 MED ORDER — ONDANSETRON HCL 4 MG/2ML IJ SOLN
4.0000 mg | Freq: Four times a day (QID) | INTRAMUSCULAR | Status: DC | PRN
  Administered 2014-01-04 – 2014-01-05 (×2): 4 mg via INTRAVENOUS
  Filled 2014-01-04 (×2): qty 2

## 2014-01-04 MED ORDER — MENTHOL 3 MG MT LOZG
1.0000 | LOZENGE | OROMUCOSAL | Status: DC | PRN
  Administered 2014-01-04 – 2014-01-07 (×2): 3 mg via ORAL
  Filled 2014-01-04: qty 9

## 2014-01-04 MED ORDER — SODIUM CHLORIDE 0.9 % IV SOLN: 1000.0000 mL | Freq: Once | INTRAVENOUS | Status: AC

## 2014-01-04 MED ORDER — SODIUM CHLORIDE 0.9 % IV SOLN
INTRAVENOUS | Status: DC
  Administered 2014-01-04: 15:00:00 via INTRAVENOUS

## 2014-01-04 MED ORDER — INSULIN REGULAR BOLUS VIA INFUSION
0.0000 [IU] | Freq: Three times a day (TID) | INTRAVENOUS | Status: DC
  Filled 2014-01-04: qty 10

## 2014-01-04 MED ORDER — DEXTROSE 50 % IV SOLN: 25.0000 mL | INTRAVENOUS | Status: DC | PRN

## 2014-01-04 MED ORDER — SODIUM CHLORIDE 0.9 % IV BOLUS (SEPSIS)
1000.0000 mL | Freq: Once | INTRAVENOUS | Status: AC
  Administered 2014-01-04: 1000 mL via INTRAVENOUS

## 2014-01-04 MED ORDER — VITAMIN B-1 100 MG PO TABS
100.0000 mg | ORAL_TABLET | Freq: Every day | ORAL | Status: DC
  Administered 2014-01-04 – 2014-01-10 (×7): 100 mg via ORAL
  Filled 2014-01-04 (×7): qty 1

## 2014-01-04 MED ORDER — THIAMINE HCL 100 MG/ML IJ SOLN
100.0000 mg | Freq: Every day | INTRAMUSCULAR | Status: DC
  Filled 2014-01-04 (×4): qty 1

## 2014-01-04 MED ORDER — LORAZEPAM 2 MG/ML IJ SOLN
1.0000 mg | Freq: Four times a day (QID) | INTRAMUSCULAR | Status: AC | PRN
  Administered 2014-01-05: 1 mg via INTRAVENOUS
  Filled 2014-01-04: qty 1

## 2014-01-04 MED ORDER — SODIUM CHLORIDE 0.9 % IV SOLN: INTRAVENOUS | Status: DC

## 2014-01-04 MED ORDER — SODIUM CHLORIDE 0.9 % IV SOLN
INTRAVENOUS | Status: DC
  Administered 2014-01-04: 22:00:00 via INTRAVENOUS

## 2014-01-04 MED ORDER — DEXTROSE-NACL 5-0.45 % IV SOLN
INTRAVENOUS | Status: DC
  Administered 2014-01-05 (×2): via INTRAVENOUS

## 2014-01-04 MED ORDER — FOLIC ACID 1 MG PO TABS
1.0000 mg | ORAL_TABLET | Freq: Every day | ORAL | Status: DC
  Administered 2014-01-04 – 2014-01-10 (×7): 1 mg via ORAL
  Filled 2014-01-04 (×7): qty 1

## 2014-01-04 MED ORDER — SODIUM CHLORIDE 0.9 % IV SOLN
INTRAVENOUS | Status: DC
  Administered 2014-01-04: 22:00:00 via INTRAVENOUS
  Administered 2014-01-05: 4.2 [IU]/h via INTRAVENOUS
  Administered 2014-01-06: 3 [IU]/h via INTRAVENOUS
  Administered 2014-01-06: 3.7 [IU]/h via INTRAVENOUS
  Administered 2014-01-06 (×2): 2.3 [IU]/h via INTRAVENOUS
  Filled 2014-01-04 (×4): qty 1

## 2014-01-04 MED ORDER — LORAZEPAM 0.5 MG PO TABS
1.0000 mg | ORAL_TABLET | Freq: Four times a day (QID) | ORAL | Status: AC | PRN
  Administered 2014-01-05: 1 mg via ORAL
  Filled 2014-01-04: qty 2

## 2014-01-04 MED ORDER — ADULT MULTIVITAMIN W/MINERALS CH
1.0000 | ORAL_TABLET | Freq: Every day | ORAL | Status: DC
  Administered 2014-01-04 – 2014-01-10 (×7): 1 via ORAL
  Filled 2014-01-04 (×7): qty 1

## 2014-01-04 MED ORDER — TRAZODONE HCL 100 MG PO TABS
100.0000 mg | ORAL_TABLET | Freq: Every day | ORAL | Status: DC
  Administered 2014-01-04 – 2014-01-09 (×6): 100 mg via ORAL
  Filled 2014-01-04 (×7): qty 1

## 2014-01-04 MED ORDER — DEXTROSE-NACL 5-0.45 % IV SOLN: INTRAVENOUS | Status: DC

## 2014-01-04 MED ORDER — SODIUM CHLORIDE 0.9 % IV SOLN: INTRAVENOUS | Status: AC

## 2014-01-04 MED ORDER — SODIUM CHLORIDE 0.9 % IV SOLN
1.0000 g | INTRAVENOUS | Status: AC
  Administered 2014-01-04: 1 g via INTRAVENOUS
  Filled 2014-01-04: qty 10

## 2014-01-04 MED ORDER — ASPIRIN 81 MG PO TBEC: 81.0000 mg | DELAYED_RELEASE_TABLET | Freq: Every day | ORAL | Status: DC

## 2014-01-04 MED ORDER — SODIUM CHLORIDE 0.9 % IV SOLN
INTRAVENOUS | Status: DC
  Administered 2014-01-04: 24.4 [IU]/h via INTRAVENOUS
  Administered 2014-01-04: 5.4 [IU]/h via INTRAVENOUS
  Administered 2014-01-04: 21.6 [IU]/h via INTRAVENOUS
  Filled 2014-01-04 (×2): qty 1

## 2014-01-04 MED ORDER — ASPIRIN EC 81 MG PO TBEC
81.0000 mg | DELAYED_RELEASE_TABLET | Freq: Every day | ORAL | Status: DC
  Administered 2014-01-04 – 2014-01-10 (×7): 81 mg via ORAL
  Filled 2014-01-04 (×7): qty 1

## 2014-01-04 MED ORDER — HEPARIN SODIUM (PORCINE) 5000 UNIT/ML IJ SOLN
5000.0000 [IU] | Freq: Three times a day (TID) | INTRAMUSCULAR | Status: DC
  Administered 2014-01-04 – 2014-01-10 (×17): 5000 [IU] via SUBCUTANEOUS
  Filled 2014-01-04 (×20): qty 1

## 2014-01-04 NOTE — ED Notes (Signed)
Per EMS: PT from home with c/o hyperglycemia, N/V/D since AM. Denies abdominal pain. Pt reports being out of insulin x 4 days. CBG > 600. AO x4. 120 ST. 105/51. 24 RR. 97% RA.

## 2014-01-04 NOTE — ED Provider Notes (Signed)
CSN: [id number]     Arrival date & time 01/04/14  1243 History   First MD Initiated Contact with Patient 01/04/14 1258     Chief Complaint  Patient presents with  . Hyperglycemia     (Consider location/radiation/quality/duration/timing/severity/associated sxs/prior Treatment) Patient is a 48 y.o. male presenting with hyperglycemia. The history is provided by the patient.  Hyperglycemia  patient here complaining of hyperglycemia since 4 days ago. He ran out of his long-term insulin. He notes polyuria and polydipsia. Does have nausea and vomiting. Notes diffuse abdominal pain without black or bloody stools. No fever or chills. Blood sugars at home have run up to 3 and 400s. Patient does not follow a diabetic diet. Notes increased weakness. Denies any cough or shortness of breath. Symptoms been gradually worse.  Past Medical History  Diagnosis Date  . Hypertension   . Blood transfusion   . Diabetes mellitus   . Venous insufficiency 12/13/2011  . Cardiac arrest 12/07/2012  . OSA (obstructive sleep apnea) 12/07/2012  . Anxiety   . Acute renal insufficiency 12/08/2012   Past Surgical History  Procedure Laterality Date  . Knee arthroscopy    . Esophagogastroduodenoscopy Left 11/26/2012    Procedure: ESOPHAGOGASTRODUODENOSCOPY (EGD);  Surgeon: Wonda Horner, MD;  Location: [facility] ENDOSCOPY;  Service: Endoscopy;  Laterality: Left;  . Cardiovascular stress test  03/16/13    Very Poor Exercise Tolerance; NON DIAGNOSTIC TEST   History reviewed. No pertinent family history. History  Substance Use Topics  . Smoking status: Never Smoker   . Smokeless tobacco: Not on file  . Alcohol Use: 8.4 oz/week    14 Cans of beer per week     Comment: couple cans day--few beers daily    Review of Systems  All other systems reviewed and are negative.      Allergies  Lisinopril  Home Medications   Current Outpatient Rx  Name  Route  Sig  Dispense  Refill  . amLODipine (NORVASC) 10 MG tablet   Oral  Take 1 tablet (10 mg total) by mouth daily.   90 tablet   3   . aspirin (ADULT ASPIRIN EC LOW STRENGTH) 81 MG EC tablet   Oral   Take 1 tablet (81 mg total) by mouth daily.   30 tablet   0   . furosemide (LASIX) 20 MG tablet   Oral   Take 1 tablet (20 mg total) by mouth daily.   90 tablet   3   . Insulin Glargine (LANTUS SOLOSTAR) 100 UNIT/ML Solostar Pen   Subcutaneous   Inject 35 Units into the skin 2 (two) times daily.   5 pen   3   . insulin lispro (HUMALOG) 100 UNIT/ML cartridge      5 units + 1 Unit for each 50 above 200  >400=5units and call doctor   5 cartridge   3   . Insulin Pen Needle 31G X 5 MM MISC      BD Pen Needles- brand specific (Lantus) - Inject insulin via insulin pen daily   250 each   3   . Insulin Syringe-Needle U-100 30G X 1/2" 0.3 ML MISC      Use twice a day.   100 each   0   . losartan (COZAAR) 100 MG tablet   Oral   Take 1 tablet (100 mg total) by mouth daily.   90 tablet   3   . metoprolol succinate (TOPROL-XL) 100 MG 24 hr tablet   Oral  Take 1 tablet (100 mg total) by mouth daily. Take with or immediately following a meal.   90 tablet   3   . Multiple Vitamin (MULTIVITAMIN WITH MINERALS) TABS tablet   Oral   Take 1 tablet by mouth daily.   30 tablet   3   . pantoprazole (PROTONIX) 40 MG tablet   Oral   Take 1 tablet (40 mg total) by mouth daily.   90 tablet   3   . pravastatin (PRAVACHOL) 80 MG tablet   Oral   Take 1 tablet (80 mg total) by mouth at bedtime. for Cholesterol   90 tablet   3   . sildenafil (VIAGRA) 100 MG tablet   Oral   Take 1 tablet (100 mg total) by mouth daily as needed for erectile dysfunction. Takes 100mg  up to four hours before intercourse   10 tablet   2   . traZODone (DESYREL) 100 MG tablet   Oral   Take 1 tablet (100 mg total) by mouth at bedtime. sleep   90 tablet   1    BP 94/35  Pulse 118  Temp(Src) 98.6 F (37 C) (Oral)  SpO2 100% Physical Exam  Nursing note and  vitals reviewed. Constitutional: He is oriented to person, place, and time. He appears well-developed and well-nourished.  Non-toxic appearance. No distress.  HENT:  Head: Normocephalic and atraumatic.  Eyes: Conjunctivae, EOM and lids are normal. Pupils are equal, round, and reactive to light.  Neck: Normal range of motion. Neck supple. No tracheal deviation present. No mass present.  Cardiovascular: Regular rhythm and normal heart sounds.  Tachycardia present.  Exam reveals no gallop.   No murmur heard. Pulmonary/Chest: Effort normal and breath sounds normal. No stridor. No respiratory distress. He has no decreased breath sounds. He has no wheezes. He has no rhonchi. He has no rales.  Abdominal: Soft. Normal appearance and bowel sounds are normal. He exhibits no distension. There is no tenderness. There is no rebound and no CVA tenderness.  Musculoskeletal: Normal range of motion. He exhibits no edema and no tenderness.  Neurological: He is alert and oriented to person, place, and time. He has normal strength. No cranial nerve deficit or sensory deficit. GCS eye subscore is 4. GCS verbal subscore is 5. GCS motor subscore is 6.  Skin: Skin is warm and dry. No abrasion and no rash noted.  Psychiatric: He has a normal mood and affect. His speech is normal and behavior is normal.    ED Course  Procedures (including critical care time) Labs Review Labs Reviewed  CBC - Abnormal; Notable for the following:    WBC 17.6 (*)    Hemoglobin 11.1 (*)    HCT 38.3 (*)    MCV 74.8 (*)    MCH 21.7 (*)    MCHC 29.0 (*)    RDW 24.2 (*)    All other components within normal limits  COMPREHENSIVE METABOLIC PANEL - Abnormal; Notable for the following:    Sodium 133 (*)    Potassium 6.4 (*)    Chloride 69 (*)    CO2 7 (*)    Glucose, Bld 861 (*)    BUN 39 (*)    Creatinine, Ser 3.28 (*)    Total Protein 8.4 (*)    GFR calc non Af Amer 21 (*)    GFR calc Af Amer 24 (*)    All other components  within normal limits  CBG MONITORING, ED - Abnormal; Notable for the  following:    Glucose-Capillary >600 (*)    All other components within normal limits  URINALYSIS, ROUTINE W REFLEX MICROSCOPIC  CBG MONITORING, ED   Imaging Review No results found.   EKG Interpretation None      MDM   Final diagnoses:  None     Date: 01/04/2014  Rate: 126   Rhythm: sinus tachycardia  QRS Axis: normal  Intervals: normal  ST/T Wave abnormalities: nonspecific ST changes  Conduction Disutrbances:none  Narrative Interpretation:   Old EKG Reviewed: changes noted   Patient given IV fluids and started on insulin drip. We'll begin calcium due to EKG changes. Patient is protecting his airway appropriately. Heart is slightly improved. Hyperkalemia noted. Patient to Be admitted to step down by the family practice service.  CRITICAL CARE Performed by: Leota Jacobsen Total critical care time: 50 Critical care time was exclusive of separately billable procedures and treating other patients. Critical care was necessary to treat or prevent imminent or life-threatening deterioration. Critical care was time spent personally by me on the following activities: development of treatment plan with patient and/or surrogate as well as nursing, discussions with consultants, evaluation of patient's response to treatment, examination of patient, obtaining history from patient or surrogate, ordering and performing treatments and interventions, ordering and review of laboratory studies, ordering and review of radiographic studies, pulse oximetry and re-evaluation of patient's condition.       Leota Jacobsen, MD 01/04/14 [phone]

## 2014-01-04 NOTE — ED Notes (Signed)
Family medicine MD at bedside.

## 2014-01-04 NOTE — H&P (Signed)
Attending Addendum  I examined the patient and discussed the assessment and plan with Dr. Wendi Snipes. I have reviewed the note and agree. Patient out of lantus x 2 weeks. Out of novolog x 1 week. Presents with DKA. Admits to SOB. No CP, fever, emesis.   Lab Results  Component Value Date   HGBA1C 8.0 12/29/2013    BP 115/69  Pulse 108  Temp(Src) 98 F (36.7 C) (Oral)  Resp 23  Ht 5\' 10"  (1.778 m)  Wt 247 lb 2.2 oz (112.1 kg)  BMI 35.46 kg/m2  SpO2 100% General appearance: drowsy, no distress. Answers questions appropriately.  Lungs: clear to auscultation bilaterally Heart: regular rate and rhythm, S1, S2 normal, no murmur, click, rub or gallop Abdomen: soft, non-tender; bowel sounds normal; no masses,  no organomegaly Back: no rash or CVA tenderness  Neurologic: alert, no weakness.   CBG (last 3)   Recent Labs  01/04/14 1611 01/04/14 1719 01/04/14 1828  GLUCAP >600* >600* >600*      48 yo M with IDDM type 2 presents with DKA in the setting of insulin non-compliance.  Plan to admit to SDU aggressive IV fluid hydration and glucostabilizer.  Frequent BMP to monitor AG closure.     Jeffrey [name], [place]

## 2014-01-04 NOTE — ED Notes (Signed)
Pt actively vomiting dark brown vomit up. Dr. Zenia Resides notified.

## 2014-01-04 NOTE — ED Notes (Signed)
Attempted second iv access, unsuccessful. Ethelene Hal Rn at bedside to attempt access.

## 2014-01-04 NOTE — ED Notes (Signed)
Claiborne Billings, RN at bedside to attempt second iv access

## 2014-01-04 NOTE — ED Notes (Addendum)
Family medicine resident at bedside.

## 2014-01-04 NOTE — ED Notes (Signed)
Critical Glucose from lab: 590 Primary RN Danielle aware.

## 2014-01-04 NOTE — Progress Notes (Deleted)
[facility] Admission History and Physical Service Pager: [phone]  Patient name: Jeffrey [name] Medical record number: [id number] Date of birth: 06-15-1966 Age: 48 y.o. Gender: male  Primary Care Provider: Teresa Coombs, DO Consultants: None  Code Status: Full  Chief Complaint: Diarrhea, vomiting, hyperglycemia  Assessment and Plan: Jeffrey [name] is a 48 y.o. male presenting with DKA . PMH is significant for type 2 diabetes, hypertension, OSA, ethanol abuse, anxiety, obesity, and hyperlipidemia.   DKA - Appears to be due to him being out of Lantus for one week and now out of regular insulin for 4 days as well. - Possibly with concomitant viral gastro-as well - Admit to step down for frequent labs and IV insulin - 9 gap of 57, bicarbonate 7, corrected sodium 151, potassium 6.4 - Treat with IV insulin per glucose stabilizer, 1/2NS at 150 an hour - Q2 BMPs - Has received two 1 L NS boluses, will bolus one more  Hyperkalemia - Likely due to cellular shifts - PT waves on EKG, has received calcium gluconate in the ED - Follow with Q2 BMPs  Hematemesis - Likely due to Mallory-Weiss tears from persistent vomiting - Gastroccult, monitor  Acute kidney injury - Likely due to prerenal injury, BUN/creatinine ratio 12.5 - Baseline creatinine appears to be around 1  HTN - soft currently due to volume depletion - Hold norvasc, lasix, and losartan  OSA - CPAP qHS Ethanol abuse - check ethanol and lipase, ciwa Anxiety - Continue trazadone, monitor Hyperlipidemia - holding sratin for now, restart when nausea improved    FEN/GI: NPO, IV 1/2NS at 150 Prophylaxis: Sub q heparin  Disposition: Step down for frequent labs and IV insulin  History of Present Illness: Jeffrey [name] is a 48 y.o. male presenting with diabetic ketoacidosis. Patient states that he ran out of his Lantus about a week and a half ago. He had his regular insulin until  about 4 days ago. He was seen in his PCPs office who sent a new prescription but he was waiting on a mail order delivery. He states that for the past 4 days he's had hyperglycemia, diarrhea, polyuria, polydipsia. He states that his polyuria got severely worse one day ago and he began vomiting everything he tried to take by mouth. He also states that he has had dyspnea for one day associated with any physical activity. His mid sharp  chest pain with cough. He's been vomiting intermittently for approximately 24 hours and states for the last hour to his vomit has appeared bloody.  He states that for the last several months his compliance has been very good with his diabetic meds.   Review Of Systems: Per HPI, Otherwise 12 point review of systems was performed and was unremarkable.  Patient Active Problem List   Diagnosis Date Noted  . OSA on CPAP 12/28/2012  . Acute renal insufficiency 12/08/2012  . Pulmonary edema 12/08/2012  . Hypoglycemia 12/07/2012  . Cardiac arrest 12/07/2012  . Diarrhea 12/07/2012  . Obesity 12/07/2012  . Anoxic brain injury 12/07/2012  . Acute respiratory failure 12/06/2012  . GI bleed 11/26/2012  . Venous insufficiency 12/13/2011  . Leg wound, left 12/03/2011  . INSOMNIA, CHRONIC 02/14/2009  . 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 ALCOHOL ABUSE, HX OF 11/10/2007  . ERECTILE DYSFUNCTION 04/14/2007  . DM type 2 goal A1C below 7.5 12/04/2006  . HYPERCHOLESTEROLEMIA 12/04/2006  . OBESITY, NOS 12/04/2006  . ANXIETY 12/04/2006  . HYPERTENSION,  BENIGN SYSTEMIC 12/04/2006  . ARTHRITIS 12/04/2006   Past Medical History: Past Medical History  Diagnosis Date  . Hypertension   . Blood transfusion   . Diabetes mellitus   . Venous insufficiency 12/13/2011  . Cardiac arrest 12/07/2012  . OSA (obstructive sleep apnea) 12/07/2012  . Anxiety   . Acute renal insufficiency 12/08/2012   Past Surgical History: Past Surgical History  Procedure Laterality  Date  . Knee arthroscopy    . Esophagogastroduodenoscopy Left 11/26/2012    Procedure: ESOPHAGOGASTRODUODENOSCOPY (EGD);  Surgeon: Wonda Horner, MD;  Location: [facility] ENDOSCOPY;  Service: Endoscopy;  Laterality: Left;  . Cardiovascular stress test  03/16/13    Very Poor Exercise Tolerance; NON DIAGNOSTIC TEST   Social History: History  Substance Use Topics  . Smoking status: Never Smoker   . Smokeless tobacco: Not on file  . Alcohol Use: 8.4 oz/week    14 Cans of beer per week     Comment: couple cans day--few beers daily   Additional social history: Please also refer to relevant sections of EMR.  Family History: History reviewed. No pertinent family history. Allergies and Medications: Allergies  Allergen Reactions  . Lisinopril Other (See Comments)    Pt reports nose bleed.   No current facility-administered medications on file prior to encounter.   Current Outpatient Prescriptions on File Prior to Encounter  Medication Sig Dispense Refill  . amLODipine (NORVASC) 10 MG tablet Take 1 tablet (10 mg total) by mouth daily.  90 tablet  3  . aspirin (ADULT ASPIRIN EC LOW STRENGTH) 81 MG EC tablet Take 1 tablet (81 mg total) by mouth daily.  30 tablet  0  . furosemide (LASIX) 20 MG tablet Take 1 tablet (20 mg total) by mouth daily.  90 tablet  3  . Insulin Glargine (LANTUS SOLOSTAR) 100 UNIT/ML Solostar Pen Inject 35 Units into the skin 2 (two) times daily.  5 pen  3  . insulin lispro (HUMALOG) 100 UNIT/ML cartridge 5 units + 1 Unit for each 50 above 200  >400=5units and call doctor  5 cartridge  3  . losartan (COZAAR) 100 MG tablet Take 1 tablet (100 mg total) by mouth daily.  90 tablet  3  . metoprolol succinate (TOPROL-XL) 100 MG 24 hr tablet Take 1 tablet (100 mg total) by mouth daily. Take with or immediately following a meal.  90 tablet  3  . pantoprazole (PROTONIX) 40 MG tablet Take 1 tablet (40 mg total) by mouth daily.  90 tablet  3  . pravastatin (PRAVACHOL) 80 MG tablet Take 1  tablet (80 mg total) by mouth at bedtime. for Cholesterol  90 tablet  3  . traZODone (DESYREL) 100 MG tablet Take 1 tablet (100 mg total) by mouth at bedtime. sleep  90 tablet  1  . Insulin Pen Needle 31G X 5 MM MISC BD Pen Needles- brand specific (Lantus) - Inject insulin via insulin pen daily  250 each  3  . Insulin Syringe-Needle U-100 30G X 1/2" 0.3 ML MISC Use twice a day.  100 each  0    Objective: BP 102/52  Pulse 124  Temp(Src) 98.4 F (36.9 C) (Oral)  Resp 24  SpO2 100% Exam: Gen: NAD, alert, cooperative with exam, ill appearing vomiting intermittently throughout exam HEENT: NCAT, EOMI, PERRL CV: RRR, good S1/S2, no murmur, palpable thrill at L lower sternal border Resp: CTABL, no wheezes, non-labored Abd: SNTND, BS present, no guarding or organomegaly Ext: No edema, warm Neuro: Alert and  oriented, strength 5/5 and sensation intact in all 4 extremities   Labs and Imaging: CBC BMET   Recent Labs Lab 01/04/14 1254  WBC 17.6*  HGB 11.1*  HCT 38.3*  PLT 384    Recent Labs Lab 01/04/14 1254  NA 133*  K 6.4*  CL 69*  CO2 7*  BUN 39*  CREATININE 3.28*  GLUCOSE 861*  CALCIUM 9.2      Recent Labs Lab 12/29/13 1425 01/04/14 1254  AST 41* 19  ALT 29 23  ALKPHOS 77 99  BILITOT 0.8 0.7  PROT 7.9 8.4*  ALBUMIN 4.1 4.2     EKG 01/04/2014: Sinus tachycardia, peak T waves  Timmothy Euler, MD 01/04/2014, 4:22 PM PGY-2, [place] Intern pager: [phone], text pages welcome

## 2014-01-04 NOTE — ED Notes (Signed)
2100 RN called back to recommend talking to diabetic coordinator about high units, diabetic coordinator paged by this RN

## 2014-01-04 NOTE — H&P (Signed)
[facility] Admission History and Physical Service Pager: [phone]  Patient name: Jeffrey [name] Medical record number: [id number] Date of birth: 03/13/1966 Age: 48 y.o. Gender: male  Primary Care Provider: Teresa Coombs, DO Consultants: None  Code Status: Full  Chief Complaint: Diarrhea, vomiting, hyperglycemia  Assessment and Plan: Jeffrey [name] is a 48 y.o. male presenting with DKA . PMH is significant for type 2 diabetes, hypertension, OSA, ethanol abuse, anxiety, obesity, and hyperlipidemia.   DKA - Appears to be due to him being out of Lantus for one week and now out of regular insulin for 4 days as well. - Possibly with concomitant viral gastro-as well - Admit to step down for frequent labs and IV insulin - 9 gap of 57, bicarbonate 7, corrected sodium 151, potassium 6.4 - Treat with IV insulin per glucose stabilizer, 1/2NS at 150 an hour - Q2 BMPs - Has received two 1 L NS boluses, will bolus one more  Hyperkalemia - Likely due to cellular shifts - PT waves on EKG, has received calcium gluconate in the ED - Follow with Q2 BMPs  Hematemesis - Likely due to Mallory-Weiss tears from persistent vomiting - Gastroccult, monitor  Acute kidney injury - Likely due to prerenal injury, BUN/creatinine ratio 12.5 - Baseline creatinine appears to be around 1  HTN - soft currently due to volume depletion - Hold norvasc, lasix, and losartan  OSA - CPAP qHS Ethanol abuse - check ethanol and lipase, ciwa Anxiety - Continue trazadone, monitor Hyperlipidemia - holding sratin for now, restart when nausea improved    FEN/GI: NPO, IV 1/2NS at 150 Prophylaxis: Sub q heparin  Disposition: Step down for frequent labs and IV insulin  History of Present Illness: Jeffrey [name] is a 48 y.o. male presenting with diabetic ketoacidosis. Patient states that he ran out of his Lantus about a week and a half ago. He had his regular insulin until  about 4 days ago. He was seen in his PCPs office who sent a new prescription but he was waiting on a mail order delivery. He states that for the past 4 days he's had hyperglycemia, diarrhea, polyuria, polydipsia. He states that his polyuria got severely worse one day ago and he began vomiting everything he tried to take by mouth. He also states that he has had dyspnea for one day associated with any physical activity. His mid sharp  chest pain with cough. He's been vomiting intermittently for approximately 24 hours and states for the last hour to his vomit has appeared bloody.  He states that for the last several months his compliance has been very good with his diabetic meds.   Review Of Systems: Per HPI, Otherwise 12 point review of systems was performed and was unremarkable.  Patient Active Problem List   Diagnosis Date Noted  . OSA on CPAP 12/28/2012  . Acute renal insufficiency 12/08/2012  . Pulmonary edema 12/08/2012  . Hypoglycemia 12/07/2012  . Cardiac arrest 12/07/2012  . Diarrhea 12/07/2012  . Obesity 12/07/2012  . Anoxic brain injury 12/07/2012  . Acute respiratory failure 12/06/2012  . GI bleed 11/26/2012  . Venous insufficiency 12/13/2011  . Leg wound, left 12/03/2011  . INSOMNIA, CHRONIC 02/14/2009  . 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 ALCOHOL ABUSE, HX OF 11/10/2007  . ERECTILE DYSFUNCTION 04/14/2007  . DM type 2 goal A1C below 7.5 12/04/2006  . HYPERCHOLESTEROLEMIA 12/04/2006  . OBESITY, NOS 12/04/2006  . ANXIETY 12/04/2006  . HYPERTENSION,  BENIGN SYSTEMIC 12/04/2006  . ARTHRITIS 12/04/2006   Past Medical History: Past Medical History  Diagnosis Date  . Hypertension   . Blood transfusion   . Diabetes mellitus   . Venous insufficiency 12/13/2011  . Cardiac arrest 12/07/2012  . OSA (obstructive sleep apnea) 12/07/2012  . Anxiety   . Acute renal insufficiency 12/08/2012   Past Surgical History: Past Surgical History  Procedure Laterality  Date  . Knee arthroscopy    . Esophagogastroduodenoscopy Left 11/26/2012    Procedure: ESOPHAGOGASTRODUODENOSCOPY (EGD);  Surgeon: Wonda Horner, MD;  Location[facility] ENDOSCOPY;  Service: Endoscopy;  Laterality: Left;  . Cardiovascular stress test  03/16/13    Very Poor Exercise Tolerance; NON DIAGNOSTIC TEST   Social History: History  Substance Use Topics  . Smoking status: Never Smoker   . Smokeless tobacco: Not on file  . Alcohol Use: 8.4 oz/week    14 Cans of beer per week     Comment: couple cans day--few beers daily   Additional social history: Please also refer to relevant sections of EMR.  Family History: History reviewed. No pertinent family history. Allergies and Medications: Allergies  Allergen Reactions  . Lisinopril Other (See Comments)    Pt reports nose bleed.   No current facility-administered medications on file prior to encounter.   Current Outpatient Prescriptions on File Prior to Encounter  Medication Sig Dispense Refill  . amLODipine (NORVASC) 10 MG tablet Take 1 tablet (10 mg total) by mouth daily.  90 tablet  3  . aspirin (ADULT ASPIRIN EC LOW STRENGTH) 81 MG EC tablet Take 1 tablet (81 mg total) by mouth daily.  30 tablet  0  . furosemide (LASIX) 20 MG tablet Take 1 tablet (20 mg total) by mouth daily.  90 tablet  3  . Insulin Glargine (LANTUS SOLOSTAR) 100 UNIT/ML Solostar Pen Inject 35 Units into the skin 2 (two) times daily.  5 pen  3  . insulin lispro (HUMALOG) 100 UNIT/ML cartridge 5 units + 1 Unit for each 50 above 200  >400=5units and call doctor  5 cartridge  3  . losartan (COZAAR) 100 MG tablet Take 1 tablet (100 mg total) by mouth daily.  90 tablet  3  . metoprolol succinate (TOPROL-XL) 100 MG 24 hr tablet Take 1 tablet (100 mg total) by mouth daily. Take with or immediately following a meal.  90 tablet  3  . pantoprazole (PROTONIX) 40 MG tablet Take 1 tablet (40 mg total) by mouth daily.  90 tablet  3  . pravastatin (PRAVACHOL) 80 MG tablet Take 1  tablet (80 mg total) by mouth at bedtime. for Cholesterol  90 tablet  3  . traZODone (DESYREL) 100 MG tablet Take 1 tablet (100 mg total) by mouth at bedtime. sleep  90 tablet  1  . Insulin Pen Needle 31G X 5 MM MISC BD Pen Needles- brand specific (Lantus) - Inject insulin via insulin pen daily  250 each  3  . Insulin Syringe-Needle U-100 30G X 1/2" 0.3 ML MISC Use twice a day.  100 each  0    Objective: BP 102/52  Pulse 124  Temp(Src) 98.4 F (36.9 C) (Oral)  Resp 24  SpO2 100% Exam: Gen: NAD, alert, cooperative with exam, ill appearing vomiting intermittently throughout exam HEENT: NCAT, EOMI, PERRL CV: RRR, good S1/S2, no murmur, palpable thrill at L lower sternal border Resp: CTABL, no wheezes, non-labored Abd: SNTND, BS present, no guarding or organomegaly Ext: No edema, warm Neuro: Alert and  oriented, strength 5/5 and sensation intact in all 4 extremities   Labs and Imaging: CBC BMET   Recent Labs Lab 01/04/14 1254  WBC 17.6*  HGB 11.1*  HCT 38.3*  PLT 384    Recent Labs Lab 01/04/14 1254  NA 133*  K 6.4*  CL 69*  CO2 7*  BUN 39*  CREATININE 3.28*  GLUCOSE 861*  CALCIUM 9.2      Recent Labs Lab 12/29/13 1425 01/04/14 1254  AST 41* 19  ALT 29 23  ALKPHOS 77 99  BILITOT 0.8 0.7  PROT 7.9 8.4*  ALBUMIN 4.1 4.2     EKG 01/04/2014: Sinus tachycardia, peak T waves  Timmothy Euler, MD 01/04/2014, 4:22 PM PGY-2, [place]

## 2014-01-04 NOTE — ED Notes (Signed)
CRITICAL LAB: CO2: 7, GLUCOSE 861. Dr. Zenia Resides and Andee Poles Primary RN made aware.

## 2014-01-04 NOTE — ED Notes (Signed)
Spoke with pharmacy pertaining to [place] being at 21.6 u/hour. Pharmacy stated to call 2100 for inquiries, 2100 RN states that 21.6 u/hour for glucostabilizer is fine.

## 2014-01-05 DIAGNOSIS — E669 Obesity, unspecified: Secondary | ICD-10-CM

## 2014-01-05 LAB — BASIC METABOLIC PANEL
BUN: 40 mg/dL — ABNORMAL HIGH (ref 6–23)
BUN: 42 mg/dL — AB (ref 6–23)
BUN: 41 mg/dL — ABNORMAL HIGH (ref 6–23)
BUN: 40 mg/dL — ABNORMAL HIGH (ref 6–23)
BUN: 36 mg/dL — ABNORMAL HIGH (ref 6–23)
BUN: 31 mg/dL — ABNORMAL HIGH (ref 6–23)
CALCIUM: 8.6 mg/dL (ref 8.4–10.5)
CALCIUM: 8.6 mg/dL (ref 8.4–10.5)
CHLORIDE: 94 meq/L — AB (ref 96–112)
CHLORIDE: 93 meq/L — AB (ref 96–112)
CO2: 26 meq/L (ref 19–32)
CO2: 28 meq/L (ref 19–32)
CO2: 31 mEq/L (ref 19–32)
CO2: 30 meq/L (ref 19–32)
CO2: 31 meq/L (ref 19–32)
CO2: 31 mEq/L (ref 19–32)
Calcium: 8.8 mg/dL (ref 8.4–10.5)
Calcium: 8.6 mg/dL (ref 8.4–10.5)
Calcium: 8.7 mg/dL (ref 8.4–10.5)
Calcium: 8.6 mg/dL (ref 8.4–10.5)
Chloride: 91 mEq/L — ABNORMAL LOW (ref 96–112)
Chloride: 92 mEq/L — ABNORMAL LOW (ref 96–112)
Chloride: 93 mEq/L — ABNORMAL LOW (ref 96–112)
Chloride: 90 mEq/L — ABNORMAL LOW (ref 96–112)
Creatinine, Ser: 3.14 mg/dL — ABNORMAL HIGH (ref 0.50–1.35)
Creatinine, Ser: 2.99 mg/dL — ABNORMAL HIGH (ref 0.50–1.35)
Creatinine, Ser: 2.83 mg/dL — ABNORMAL HIGH (ref 0.50–1.35)
Creatinine, Ser: 2.5 mg/dL — ABNORMAL HIGH (ref 0.50–1.35)
Creatinine, Ser: 2.02 mg/dL — ABNORMAL HIGH (ref 0.50–1.35)
Creatinine, Ser: 1.69 mg/dL — ABNORMAL HIGH (ref 0.50–1.35)
GFR calc Af Amer: 25 mL/min — ABNORMAL LOW (ref >90)
GFR calc Af Amer: 27 mL/min — ABNORMAL LOW (ref >90)
GFR calc Af Amer: 43 mL/min — ABNORMAL LOW (ref >90)
GFR calc non Af Amer: 22 mL/min — ABNORMAL LOW (ref >90)
GFR calc non Af Amer: 23 mL/min — ABNORMAL LOW (ref >90)
GFR calc non Af Amer: 25 mL/min — ABNORMAL LOW (ref >90)
GFR calc non Af Amer: 29 mL/min — ABNORMAL LOW (ref >90)
GFR, EST AFRICAN AMERICAN: 29 mL/min — AB (ref >90)
GFR, EST AFRICAN AMERICAN: 33 mL/min — AB (ref >90)
GFR, EST AFRICAN AMERICAN: 53 mL/min — AB (ref >90)
GFR, EST NON AFRICAN AMERICAN: 37 mL/min — AB (ref >90)
GFR, EST NON AFRICAN AMERICAN: 46 mL/min — AB (ref >90)
GLUCOSE: 193 mg/dL — AB (ref 70–99)
GLUCOSE: 152 mg/dL — AB (ref 70–99)
Glucose, Bld: 119 mg/dL — ABNORMAL HIGH (ref 70–99)
Glucose, Bld: 211 mg/dL — ABNORMAL HIGH (ref 70–99)
Glucose, Bld: 151 mg/dL — ABNORMAL HIGH (ref 70–99)
Glucose, Bld: 170 mg/dL — ABNORMAL HIGH (ref 70–99)
POTASSIUM: 4 meq/L (ref 3.7–5.3)
POTASSIUM: 3.9 meq/L (ref 3.7–5.3)
Potassium: 4.3 mEq/L (ref 3.7–5.3)
Potassium: 4.2 mEq/L (ref 3.7–5.3)
Potassium: 4.5 mEq/L (ref 3.7–5.3)
Potassium: 4.1 mEq/L (ref 3.7–5.3)
SODIUM: 144 meq/L (ref 137–147)
SODIUM: 144 meq/L (ref 137–147)
SODIUM: 144 meq/L (ref 137–147)
SODIUM: 139 meq/L (ref 137–147)
SODIUM: 134 meq/L — AB (ref 137–147)
Sodium: 144 mEq/L (ref 137–147)

## 2014-01-05 LAB — GLUCOSE, CAPILLARY
GLUCOSE-CAPILLARY: 180 mg/dL — AB (ref 70–99)
GLUCOSE-CAPILLARY: 146 mg/dL — AB (ref 70–99)
GLUCOSE-CAPILLARY: 132 mg/dL — AB (ref 70–99)
GLUCOSE-CAPILLARY: 106 mg/dL — AB (ref 70–99)
GLUCOSE-CAPILLARY: 148 mg/dL — AB (ref 70–99)
GLUCOSE-CAPILLARY: 176 mg/dL — AB (ref 70–99)
GLUCOSE-CAPILLARY: 151 mg/dL — AB (ref 70–99)
GLUCOSE-CAPILLARY: 154 mg/dL — AB (ref 70–99)
GLUCOSE-CAPILLARY: 159 mg/dL — AB (ref 70–99)
Glucose-Capillary: 119 mg/dL — ABNORMAL HIGH (ref 70–99)
Glucose-Capillary: 126 mg/dL — ABNORMAL HIGH (ref 70–99)
Glucose-Capillary: 133 mg/dL — ABNORMAL HIGH (ref 70–99)
Glucose-Capillary: 148 mg/dL — ABNORMAL HIGH (ref 70–99)
Glucose-Capillary: 156 mg/dL — ABNORMAL HIGH (ref 70–99)
Glucose-Capillary: 163 mg/dL — ABNORMAL HIGH (ref 70–99)
Glucose-Capillary: 172 mg/dL — ABNORMAL HIGH (ref 70–99)
Glucose-Capillary: 175 mg/dL — ABNORMAL HIGH (ref 70–99)
Glucose-Capillary: 203 mg/dL — ABNORMAL HIGH (ref 70–99)
Glucose-Capillary: 219 mg/dL — ABNORMAL HIGH (ref 70–99)
Glucose-Capillary: 236 mg/dL — ABNORMAL HIGH (ref 70–99)
Glucose-Capillary: 280 mg/dL — ABNORMAL HIGH (ref 70–99)
Glucose-Capillary: 548 mg/dL — ABNORMAL HIGH (ref 70–99)

## 2014-01-05 LAB — URINE MICROSCOPIC-ADD ON

## 2014-01-05 LAB — CBC
HCT: 33.6 % — ABNORMAL LOW (ref 39.0–52.0)
Hemoglobin: 10.4 g/dL — ABNORMAL LOW (ref 13.0–17.0)
MCH: 21.9 pg — ABNORMAL LOW (ref 26.0–34.0)
MCHC: 31 g/dL (ref 30.0–36.0)
MCV: 70.7 fL — AB (ref 78.0–100.0)
Platelets: 350 10*3/uL (ref 150–400)
RBC: 4.75 MIL/uL (ref 4.22–5.81)
RDW: 22.8 % — ABNORMAL HIGH (ref 11.5–15.5)
WBC: 17.5 10*3/uL — ABNORMAL HIGH (ref 4.0–10.5)

## 2014-01-05 LAB — URINALYSIS, ROUTINE W REFLEX MICROSCOPIC
Ketones, ur: >80 mg/dL — AB
Leukocytes, UA: NEGATIVE
Nitrite: NEGATIVE
PH: 5 (ref 5.0–8.0)
PROTEIN: 30 mg/dL — AB
Specific Gravity, Urine: 1.031 — ABNORMAL HIGH (ref 1.005–1.030)
Urobilinogen, UA: 1 mg/dL (ref 0.0–1.0)

## 2014-01-05 LAB — TROPONIN I

## 2014-01-05 MED ORDER — POTASSIUM CHLORIDE 10 MEQ/100ML IV SOLN
10.0000 meq | INTRAVENOUS | Status: AC
  Administered 2014-01-05 (×2): 10 meq via INTRAVENOUS
  Filled 2014-01-05 (×2): qty 100

## 2014-01-05 MED ORDER — SODIUM CHLORIDE 0.9 % IV BOLUS (SEPSIS)
500.0000 mL | Freq: Once | INTRAVENOUS | Status: AC
  Administered 2014-01-05: 500 mL via INTRAVENOUS

## 2014-01-05 MED ORDER — KCL IN DEXTROSE-NACL 20-5-0.45 MEQ/L-%-% IV SOLN
INTRAVENOUS | Status: DC
  Administered 2014-01-05: 150 mL/h via INTRAVENOUS
  Administered 2014-01-06 (×2): via INTRAVENOUS
  Administered 2014-01-06: 1000 mL via INTRAVENOUS
  Filled 2014-01-05 (×11): qty 1000

## 2014-01-05 NOTE — Progress Notes (Signed)
Attending Addendum  I examined the patient and discussed the assessment and plan with Dr. Berkley Harvey. I have reviewed the note and agree.  Patient improving. Needs potassium in IVF for K< 5.3, added.  Plan to transition off glucosestabilizer to SQ insulin today.  1 L NS ordered for tachycardia.     Jeffrey [name], [place]

## 2014-01-05 NOTE — Progress Notes (Signed)
Spoke with patient about diabetes and home regimen for diabetes control.  Patient reports that his PCP (Dr. Paulla Fore) helps him with diabetes management.  Patient reports that he takes Lantus 35 units BID and Humalog 5 units plus sliding scale TID as an outpatient. However patient states that he ran out of insulin so he ended up in the hospital.  Patient states that his insurance company Nurse, mental health) has made him go to [employer] order and even though his PCP provided a 90 day refill prescription his insurance company would only give him a 75 day refill so he did not have enough insulin to do him until he was authorized to refill. Encouraged patient to call his insurance company and clarify when he needs to do to be sure he has medication to last the entire 90 days. Also, provided patient with verbal and written information on Lantus savings plan and Humalog savings plan.  Informed patient he has to sign up online for the savings card which allows patient to have only a $25 copay for a 30 day supply of insulin (currently pt pays $200 copay for 90 day supply for each insulin).  Discussed A1C results (8.0% on 12/29/13) and patient reports that his "A1C is generally pretty good because he tries to keep his diabetes controlled so he does not have problems from diabetes." Also, reminded patient to always keep his PCP informed about what is going on because his PCP may have samples of insulin he could have provided him with to get him through until he could get things worked out with the mail order prescription medications.  Patient stated that he did not know (until he was told by someone else at the hospital today) that he could ask his PCP for samples.  Patient verbalized understanding of information discussed and reports that he has no further questions related to diabetes at this time.   Thanks, Barnie Alderman, RN, MSN, CCRN Diabetes Coordinator Inpatient Diabetes Program [phone] (Team Pager) [phone] ([place]  office) [phone] [facility] office)

## 2014-01-05 NOTE — Progress Notes (Signed)
Family Medicine Teaching Service Daily Progress Note Intern Pager: [phone]  Patient name: Jeffrey [name] Medical record number: [id number] Date of birth: 09-Jul-1966 Age: 48 y.o. Gender: male  Primary Care Provider: Teresa Coombs, DO Consultants: none Code Status: full  Pt Overview and Major Events to Date:  3/31: DKA; AG 57, bicarb 7; Hyperkalemia s/p calcium gluconate; AKI 4/1: AG 17, Bicarb 25; HyperK resolved; AKI resolved  Assessment and Plan: Jeffrey [name] is a 48 y.o. male presenting with DKA . PMH is significant for type 2 diabetes, hypertension, OSA, ethanol abuse, anxiety, obesity, and hyperlipidemia.   DKA - Improving - Appears to be due to him being out of Lantus for one week and now out of regular insulin for 4 days as well. Possibly with concomitant viral gastro-as well  - AG 17 (57 on admit), bicarbonate 25 (admit = 7), corrected sodium 136 (Admit =151) - glucose stabilizer - Continue until [place] closed x 2 then transition back to home insulin regimen - Q4 BMPs   HTN  - soft currently due to volume depletion  - Holding norvasc, lasix, and losartan - Consider d/c lasix if BP remains well control without  Hyperkalemia: Resolved - Likely due to cellular shifts; PT waves on EKG, has received calcium gluconate in the ED   Hematemesis - Resolved  - Likely due to Mallory-Weiss tears from persistent vomiting  - Gastroccult, monitor   Acute kidney injury Resolved - Likely due to prerenal injury, BUN/creatinine ratio 12.5  - Cr = 1.06 (Baseline ~1)  Ethanol abuse  -Ethanol negative - lipase = 99; possibly due to emesis on admission - ciwa: < 5  OSA - CPAP qHS  Anxiety - Continue trazadone Hyperlipidemia - holding sratin for now, restart when nausea improved   FEN/GI: Advance to full liquid, IV D5 1/2NS at 150  Prophylaxis: Sub q heparin   Disposition: Step down for frequent labs and IV insulin  Subjective:  No complaints. Denies N/V. Wanting to  drink  Objective: Temp:  [97.9 F (36.6 C)-98.6 F (37 C)] 98.2 F (36.8 C) (04/01 0427) Pulse Rate:  [108-135] 116 (04/01 0427) Resp:  [17-31] 17 (04/01 0427) BP: (92-120)/(35-79) 98/61 mmHg (04/01 0427) SpO2:  [95 %-100 %] 95 % (04/01 0427) Weight:  [247 lb 2.2 oz (112.1 kg)] 247 lb 2.2 oz (112.1 kg) (03/31 2006) Physical Exam: Gen: NAD, alert, cooperative with exam HEENT: NCAT, EOMI, PERRL  CV: RRR, good S1/S2, no murmur, palpable thrill at L lower sternal border  Resp: CTABL, no wheezes, non-labored  Abd: SNTND, BS present, no guarding or organomegaly  Ext: No edema, warm  Neuro: Alert and oriented  Laboratory:  Recent Labs Lab 12/29/13 1425 01/04/14 1254 01/05/14 0114  WBC 13.8* 17.6* 17.5*  HGB 10.3* 11.1* 10.4*  HCT 35.1* 38.3* 33.6*  PLT 317 384 350    Recent Labs Lab 12/29/13 1425 01/04/14 1254  01/04/14 2157 01/05/14 0114 01/05/14 0235  NA 134* 133*  < > 144 144 144  K 4.4 6.4*  < > 4.3 4.3 4.0  CL 92* 69*  < > 87* 91* 92*  CO2 25 7*  < > 21 26 28   BUN 9 39*  < > 40* 40* 42*  CREATININE 1.06 3.28*  < > 3.22* 3.14* 2.99*  CALCIUM 9.4 9.2  < > 8.4 8.6 8.8  PROT 7.9 8.4*  --   --   --   --   BILITOT 0.8 0.7  --   --   --   --  ALKPHOS 77 99  --   --   --   --   ALT 29 23  --   --   --   --   AST 41* 19  --   --   --   --   GLUCOSE 283* 861*  < > 350* 193* 152*  < > = values in this interval not displayed.    Recent Labs Lab 01/04/14 1637 01/04/14 2157 01/05/14 0114  TROPONINI <0.30 <0.30 <0.30   Imaging/Diagnostic Tests: EKG 01/04/2014: Sinus tachycardia, peak T waves  Phill Myron, MD 01/05/2014, 6:34 AM PGY-1, [place] Intern pager: [phone], text pages welcome

## 2014-01-06 LAB — BASIC METABOLIC PANEL
BUN: 25 mg/dL — ABNORMAL HIGH (ref 6–23)
BUN: 20 mg/dL (ref 6–23)
BUN: 18 mg/dL (ref 6–23)
BUN: 15 mg/dL (ref 6–23)
BUN: 14 mg/dL (ref 6–23)
CALCIUM: 8.6 mg/dL (ref 8.4–10.5)
CHLORIDE: 93 meq/L — AB (ref 96–112)
CHLORIDE: 91 meq/L — AB (ref 96–112)
CO2: 29 meq/L (ref 19–32)
CO2: 30 meq/L (ref 19–32)
CO2: 30 meq/L (ref 19–32)
CO2: 29 mEq/L (ref 19–32)
CO2: 28 mEq/L (ref 19–32)
CREATININE: 1.34 mg/dL (ref 0.50–1.35)
CREATININE: 1.15 mg/dL (ref 0.50–1.35)
CREATININE: 1.11 mg/dL (ref 0.50–1.35)
CREATININE: 1.03 mg/dL (ref 0.50–1.35)
Calcium: 8.5 mg/dL (ref 8.4–10.5)
Calcium: 8.6 mg/dL (ref 8.4–10.5)
Calcium: 8.9 mg/dL (ref 8.4–10.5)
Calcium: 8.6 mg/dL (ref 8.4–10.5)
Chloride: 93 mEq/L — ABNORMAL LOW (ref 96–112)
Chloride: 94 mEq/L — ABNORMAL LOW (ref 96–112)
Chloride: 92 mEq/L — ABNORMAL LOW (ref 96–112)
Creatinine, Ser: 1.07 mg/dL (ref 0.50–1.35)
GFR calc Af Amer: 70 mL/min — ABNORMAL LOW (ref >90)
GFR calc Af Amer: 85 mL/min — ABNORMAL LOW (ref >90)
GFR calc Af Amer: 88 mL/min — ABNORMAL LOW (ref >90)
GFR calc Af Amer: >90 mL/min (ref >90)
GFR calc non Af Amer: 61 mL/min — ABNORMAL LOW (ref >90)
GFR calc non Af Amer: 73 mL/min — ABNORMAL LOW (ref >90)
GFR calc non Af Amer: 76 mL/min — ABNORMAL LOW (ref >90)
GFR calc non Af Amer: 84 mL/min — ABNORMAL LOW (ref >90)
GFR, EST NON AFRICAN AMERICAN: 80 mL/min — AB (ref >90)
GLUCOSE: 157 mg/dL — AB (ref 70–99)
GLUCOSE: 161 mg/dL — AB (ref 70–99)
Glucose, Bld: 140 mg/dL — ABNORMAL HIGH (ref 70–99)
Glucose, Bld: 181 mg/dL — ABNORMAL HIGH (ref 70–99)
Glucose, Bld: 269 mg/dL — ABNORMAL HIGH (ref 70–99)
POTASSIUM: 3.9 meq/L (ref 3.7–5.3)
Potassium: 4 mEq/L (ref 3.7–5.3)
Potassium: 3.5 mEq/L — ABNORMAL LOW (ref 3.7–5.3)
Potassium: 3.7 mEq/L (ref 3.7–5.3)
Potassium: 4.5 mEq/L (ref 3.7–5.3)
SODIUM: 135 meq/L — AB (ref 137–147)
Sodium: 136 mEq/L — ABNORMAL LOW (ref 137–147)
Sodium: 134 mEq/L — ABNORMAL LOW (ref 137–147)
Sodium: 136 mEq/L — ABNORMAL LOW (ref 137–147)
Sodium: 132 mEq/L — ABNORMAL LOW (ref 137–147)

## 2014-01-06 LAB — GLUCOSE, CAPILLARY
GLUCOSE-CAPILLARY: 151 mg/dL — AB (ref 70–99)
GLUCOSE-CAPILLARY: 157 mg/dL — AB (ref 70–99)
GLUCOSE-CAPILLARY: 132 mg/dL — AB (ref 70–99)
GLUCOSE-CAPILLARY: 189 mg/dL — AB (ref 70–99)
GLUCOSE-CAPILLARY: 152 mg/dL — AB (ref 70–99)
GLUCOSE-CAPILLARY: 146 mg/dL — AB (ref 70–99)
GLUCOSE-CAPILLARY: 137 mg/dL — AB (ref 70–99)
GLUCOSE-CAPILLARY: 189 mg/dL — AB (ref 70–99)
GLUCOSE-CAPILLARY: 161 mg/dL — AB (ref 70–99)
Glucose-Capillary: 110 mg/dL — ABNORMAL HIGH (ref 70–99)
Glucose-Capillary: 142 mg/dL — ABNORMAL HIGH (ref 70–99)
Glucose-Capillary: 143 mg/dL — ABNORMAL HIGH (ref 70–99)
Glucose-Capillary: 144 mg/dL — ABNORMAL HIGH (ref 70–99)
Glucose-Capillary: 145 mg/dL — ABNORMAL HIGH (ref 70–99)
Glucose-Capillary: 150 mg/dL — ABNORMAL HIGH (ref 70–99)
Glucose-Capillary: 152 mg/dL — ABNORMAL HIGH (ref 70–99)
Glucose-Capillary: 153 mg/dL — ABNORMAL HIGH (ref 70–99)
Glucose-Capillary: 155 mg/dL — ABNORMAL HIGH (ref 70–99)
Glucose-Capillary: 164 mg/dL — ABNORMAL HIGH (ref 70–99)
Glucose-Capillary: 167 mg/dL — ABNORMAL HIGH (ref 70–99)
Glucose-Capillary: 169 mg/dL — ABNORMAL HIGH (ref 70–99)
Glucose-Capillary: 215 mg/dL — ABNORMAL HIGH (ref 70–99)

## 2014-01-06 MED ORDER — LOSARTAN POTASSIUM 50 MG PO TABS
100.0000 mg | ORAL_TABLET | Freq: Every day | ORAL | Status: DC
  Filled 2014-01-06: qty 2

## 2014-01-06 MED ORDER — LOSARTAN POTASSIUM 50 MG PO TABS
50.0000 mg | ORAL_TABLET | Freq: Every day | ORAL | Status: DC
  Administered 2014-01-07 – 2014-01-08 (×2): 50 mg via ORAL
  Filled 2014-01-06 (×3): qty 1

## 2014-01-06 MED ORDER — ONDANSETRON HCL 4 MG PO TABS
4.0000 mg | ORAL_TABLET | Freq: Three times a day (TID) | ORAL | Status: DC | PRN
  Administered 2014-01-06 – 2014-01-08 (×2): 4 mg via ORAL
  Filled 2014-01-06 (×2): qty 1

## 2014-01-06 MED ORDER — SIMVASTATIN 40 MG PO TABS
40.0000 mg | ORAL_TABLET | Freq: Every day | ORAL | Status: DC
  Administered 2014-01-06: 40 mg via ORAL
  Filled 2014-01-06: qty 1

## 2014-01-06 MED ORDER — INSULIN ASPART 100 UNIT/ML ~~LOC~~ SOLN
0.0000 [IU] | Freq: Every day | SUBCUTANEOUS | Status: DC
  Administered 2014-01-06: 2 [IU] via SUBCUTANEOUS

## 2014-01-06 MED ORDER — METOPROLOL TARTRATE 25 MG PO TABS
25.0000 mg | ORAL_TABLET | Freq: Two times a day (BID) | ORAL | Status: DC
  Administered 2014-01-06 – 2014-01-08 (×6): 25 mg via ORAL
  Filled 2014-01-06 (×8): qty 1

## 2014-01-06 MED ORDER — INSULIN ASPART 100 UNIT/ML ~~LOC~~ SOLN: 0.0000 [IU] | Freq: Three times a day (TID) | SUBCUTANEOUS | Status: DC

## 2014-01-06 MED ORDER — INSULIN GLARGINE 100 UNIT/ML ~~LOC~~ SOLN
30.0000 [IU] | Freq: Once | SUBCUTANEOUS | Status: AC
  Administered 2014-01-06: 30 [IU] via SUBCUTANEOUS
  Filled 2014-01-06: qty 0.3

## 2014-01-06 NOTE — Progress Notes (Signed)
Attending Addendum  I examined the patient and discussed the assessment and plan with Dr. Berkley Harvey. I have reviewed the note and agree.  Plan to continue glucosestabilizer until AG closed. Will remain in SDU until off glucosestabilizer.     Jeffrey [name], [place]

## 2014-01-06 NOTE — Discharge Summary (Signed)
[facility] Discharge Summary  Patient name: Jeffrey [name] Medical record number: [id number] Date of birth: 04-15-1966 Age: 48 y.o. Gender: male Date of Admission: 01/04/2014  Date of Discharge: 01/10/14 Admitting Physician: Minerva Ends, MD  Primary Care Provider: Teresa Coombs, DO Consultants: none  Indication for Hospitalization: DKA  Discharge Diagnoses/Problem List:   DKA due to medication noncompliance (switch mail order)  Hyperkalemia - resolved  AKI - Resolved  HTN - (Discontinue Metoprolol and Lasxi)  OSA on CPAP  HLD  Disposition: home  Discharge Condition:  Stable  Discharge Exam:  Filed Vitals:   01/10/14 0402 01/10/14 0828 01/10/14 0845 01/10/14 1232  BP: 124/69 134/84 134/84 129/88  Pulse: 73 68  93  Temp: 98 F (36.7 C) 98.3 F (36.8 C)  98.1 F (36.7 C)  TempSrc: Oral Oral  Oral  Resp: 14 18  20   Height:      Weight:      SpO2: 97% 100%  100%   Gen: NAD, alert, cooperative with exam  CV: RRR, good S1/S2, no murmur  Resp: CTABL, no wheezes, non-labored  Abd: SNTND, BS present, no guarding or organomegaly  Ext: No edema, warm  Neuro: Alert and oriented  Brief Hospital Course:  Jeffrey [name] is a 48 y.o. male who presented with DKA . PMH is significant for type 2 diabetes, hypertension, OSA, ethanol abuse, anxiety, obesity, and hyperlipidemia. His DKA was due to him being out of Lantus for one week and regular insulin for 4 days due to delay with his mail order prescriptions. Initial Labs: AG 57, Bicarb 7, and potassium 6.4. He was placed on glucostabilzer and treated IVF and electrolyte replacement. He was transitioned back to subq insulin with resolution of his acidosis and discharged home.   DM2: Switched back to Lantus 35u BID and Humalog 5u + SSI (1 additional unit for every 50 points above 200) w/ meals. He would likely benefit from additional meal-time coverage. Asked him to check CBGs prior to meals  and 2 hrs post-prandial and bring results to his apt on 01/14/14.  HTN: Soft BPs on admit likely due to dehydration; However BPs remained well controlled on Cozaar and Norvasc so Metoprolol (which he had not taken for 1 month) and Lasix were discontinued at discharge.  AKI: Resolved with IVF  Issues for Follow Up:   Check CBG log and titrate meal-time insulin as needed  Check BP; Since metoprolol and lasix stop (see above)  Significant Procedures: none  Significant Labs and Imaging:   Recent Labs Lab 01/04/14 1254 01/05/14 0114  WBC 17.6* 17.5*  HGB 11.1* 10.4*  HCT 38.3* 33.6*  PLT 384 350    Recent Labs Lab 01/04/14 1254  01/09/14 0640 01/09/14 1041 01/09/14 1445 01/09/14 2100 01/10/14 0501  NA 133*  < > 131* 134* 131* 127* 132*  K 6.4*  < > 4.4 4.1 4.6 5.3 5.2  CL 69*  < > 91* 95* 93* 90* 94*  CO2 7*  < > 25 27 24 21 21   GLUCOSE 861*  < > 190* 94 222* 373* 268*  BUN 39*  < > 9 9 9 10 11   CREATININE 3.28*  < > 1.04 1.10 1.10 1.18 1.15  CALCIUM 9.2  < > 9.4 9.5 9.0 9.1 9.5  ALKPHOS 99  --   --   --   --   --   --   AST 19  --   --   --   --   --   --  ALT 23  --   --   --   --   --   --   ALBUMIN 4.2  --   --   --   --   --   --   < > = values in this interval not displayed.  CBG (last 3)   Recent Labs  01/10/14 0123 01/10/14 0831 01/10/14 1234  GLUCAP 352* 241* 227*   Results/Tests Pending at Time of Discharge: none  Discharge Medications:    Medication List    STOP taking these medications       furosemide 20 MG tablet  Commonly known as:  LASIX     metoprolol succinate 100 MG 24 hr tablet  Commonly known as:  TOPROL-XL      TAKE these medications       amLODipine 10 MG tablet  Commonly known as:  NORVASC  Take 1 tablet (10 mg total) by mouth daily.     aspirin 81 MG EC tablet  Commonly known as:  ADULT ASPIRIN EC LOW STRENGTH  Take 1 tablet (81 mg total) by mouth daily.     Insulin Glargine 100 UNIT/ML Solostar Pen  Commonly known  as:  LANTUS SOLOSTAR  Inject 35 Units into the skin 2 (two) times daily.     insulin lispro 100 UNIT/ML cartridge  Commonly known as:  HUMALOG  5 units + 1 Unit for each 50 above 200  >400=5units and call doctor     Insulin Pen Needle 31G X 5 MM Misc  BD Pen Needles- brand specific (Lantus) - Inject insulin via insulin pen daily     Insulin Syringe-Needle U-100 30G X 1/2" 0.3 ML Misc  Use twice a day.     losartan 100 MG tablet  Commonly known as:  COZAAR  Take 1 tablet (100 mg total) by mouth daily.     losartan 100 MG tablet  Commonly known as:  COZAAR  Take 1 tablet (100 mg total) by mouth daily.     pantoprazole 40 MG tablet  Commonly known as:  PROTONIX  Take 1 tablet (40 mg total) by mouth daily.     pravastatin 80 MG tablet  Commonly known as:  PRAVACHOL  Take 1 tablet (80 mg total) by mouth at bedtime. for Cholesterol     traZODone 100 MG tablet  Commonly known as:  DESYREL  Take 1 tablet (100 mg total) by mouth at bedtime. sleep        Discharge Instructions: Please refer to Patient Instructions section of EMR for full details.  Patient was counseled important signs and symptoms that should prompt return to medical care, changes in medications, dietary instructions, activity restrictions, and follow up appointments.   Follow-Up Appointments: Follow-up Information   Follow up with RIGBY, MICHAEL, DO.   Specialty:  Family Medicine   Contact information:   [address] [place] [address] [phone]       Follow up with Cordelia Poche, MD On 01/14/2014. (Apt at 2:30)    Specialty:  Family Medicine   Contact information:   [place] [address] [phone]       Phill Myron, MD 01/10/2014, 2:36 PM PGY-1, [place]

## 2014-01-06 NOTE — Progress Notes (Signed)
Inpatient Diabetes Program Recommendations  AACE/ADA: New Consensus Statement on Inpatient Glycemic Control (2013)  Target Ranges:  Prepandial:   less than 140 mg/dL      Peak postprandial:   less than 180 mg/dL (1-2 hours)      Critically ill patients:  140 - 180 mg/dL   Last 8 hours of CBGs have not been uploaded but are as follows: 132/189/152/143/152/153/142/155  Diabetes history: DM2 Outpatient Diabetes medications: Lantus 35 units BID, Humalog 5 units + SSI TID with meals Current orders for Inpatient glycemic control: Novolin R insulin drip via [place]  Inpatient Diabetes Program Recommendations Insulin - IV drip/GlucoStabilizer: Please consider removing dextrose from IV fluids if patient is tolerating PO intake. Recommend transitioning patient to SQ insulin. Insulin - Basal: Please consider transitioning to SQ insulin, recommend ordering Lantus 35 units BID. Correction (SSI): Please consider transitioning to SQ insulin, recommend ordering Novolog moderate correction scale ACHS. Insulin - Meal Coverage: Please consider transitioning to SQ insulin, recommend ordering Novolog 5 units TID with meals for meal coverage when diet advanced to Carb Modified diabetic diet. Diet: When diet is advanced, please order Carb Modified Diabetic diet.  Note: CBGs have been in target range for over 18 hours and labs today at 3:26 am show CO2 29 and AG 14. With CO2 greater than 20, AG likely not related to diabetes.  Therefore, recommend transitioning patient off IV insulin to SQ insulin and resume patient's home insulin regimen. Please consider ordering Lantus 35 units BID, Novolog moderate correction scale ACHS, and Novolog 5 units TID with meals for meal coverage.  Also, when diet is advanced, please order Carb Modified diet.   Thanks, Barnie Alderman, RN, MSN, CCRN Diabetes Coordinator Inpatient Diabetes Program [phone] (Team Pager) [phone] ([place] office) [phone] [facility]  office)

## 2014-01-06 NOTE — Progress Notes (Signed)
Family Medicine Teaching Service Daily Progress Note Intern Pager: [phone]  Patient name: Jeffrey [name] Medical record number: [id number] Date of birth: Feb 17, 1966 Age: 48 y.o. Gender: male  Primary Care Provider: Teresa Coombs, DO Consultants: none Code Status: full  Pt Overview and Major Events to Date:  3/31: DKA; AG 57, bicarb 7; Hyperkalemia s/p calcium gluconate; AKI 4/1: AG 17, Bicarb 25; HyperK resolved; AKI resolved 4/2: AG 14, BP increasing may need to restart BP meds 1 by 1 today  Assessment and Plan: Jeffrey [name] is a 48 y.o. male presenting with DKA . PMH is significant for type 2 diabetes, hypertension, OSA, ethanol abuse, anxiety, obesity, and hyperlipidemia.   DKA - Improving - Appears to be due to him being out of Lantus for one week and now out of regular insulin for 4 days as well. Possibly with concomitant viral gastro-as well  - AG 14 (57 on admit), bicarbonate 29 (admit = 7), corrected sodium 137 (Admit =151) - glucose stabilizer - Continue until Gap closed x 2 then transition back to home insulin regimen - Q4 BMPs   HTN  - BPs last 24: 114-160/50-88; 148/88 mmHg - soft on admission due to volume depletion, now increasing - Will restart BP meds 1-by-1  - Restart losartan at 1/2 original dose due to improving renal fcn (4/2) - restart Metoprolol: decreased dose; may be able to wean  - Holding norvasc; lasix (likely DC)  Hyperkalemia: Resolved - Likely due to cellular shifts; PT waves on EKG, has received calcium gluconate in the ED  - potassium currently added to IVFs  Hematemesis - Resolved  - Zofran prn  Acute kidney injury: Improving - Likely due to prerenal injury, BUN/creatinine ratio 12.5  - Cr = 1.34 (Baseline ~1) - Continue IVFs  Ethanol abuse  -Ethanol negative - lipase = 99; possibly due to emesis on admission - ciwa: < 5  OSA - CPAP qHS  Anxiety - Continue trazadone Hyperlipidemia - statin  FEN/GI: Advance diet as  tolerated to Carb Mod, IV D5 1/2NS at 150  Prophylaxis: Sub q heparin   Disposition: Step down for frequent labs and IV insulin until transitioned back to home insulin regimen  Subjective:  Still feels "dry" w/ some nausea.   Objective: Temp:  [97.8 F (36.6 C)-98.5 F (36.9 C)] 98.2 F (36.8 C) (04/02 0405) Pulse Rate:  [78-116] 90 (04/02 0610) Resp:  [11-21] 15 (04/02 0610) BP: (114-160)/(50-88) 148/88 mmHg (04/02 0610) SpO2:  [92 %-98 %] 95 % (04/02 0610) Physical Exam: Gen: NAD, alert, cooperative with exam HEENT: NCAT, EOMI, PERRL  CV: RRR, good S1/S2, no murmur Resp: CTABL, no wheezes, non-labored  Abd: SNTND, BS present, no guarding or organomegaly  Ext: No edema, warm  Neuro: Alert and oriented  Laboratory:  Recent Labs Lab 01/04/14 1254 01/05/14 0114  WBC 17.6* 17.5*  HGB 11.1* 10.4*  HCT 38.3* 33.6*  PLT 384 350    Recent Labs Lab 01/04/14 1254  01/05/14 1630 01/05/14 2040 01/06/14 0326  NA 133*  < > 139 134* 136*  K 6.4*  < > 4.1 3.9 4.0  CL 69*  < > 93* 90* 93*  CO2 7*  < > 31 31 29   BUN 39*  < > 36* 31* 25*  CREATININE 3.28*  < > 2.02* 1.69* 1.34  CALCIUM 9.2  < > 8.7 8.6 8.5  PROT 8.4*  --   --   --   --   BILITOT 0.7  --   --   --   --  ALKPHOS 99  --   --   --   --   ALT 23  --   --   --   --   AST 19  --   --   --   --   GLUCOSE 861*  < > 151* 170* 157*  < > = values in this interval not displayed.     Recent Labs Lab 01/04/14 1637 01/04/14 2157 01/05/14 0114  TROPONINI <0.30 <0.30 <0.30   Imaging/Diagnostic Tests: EKG 01/04/2014: Sinus tachycardia, peak T waves  Phill Myron, MD 01/06/2014, 7:02 AM PGY-1, [place] Intern pager: [phone], text pages welcome

## 2014-01-06 NOTE — Progress Notes (Signed)
RT applied the CPAP for the patient with the AUTO settings of 20 Max and 5 MIN (pressures).  Patient is tolerating the CPAP with no complications at this time. Vital signs are within normal limits. RT will continue to monitor.

## 2014-01-07 LAB — GLUCOSE, CAPILLARY
GLUCOSE-CAPILLARY: 243 mg/dL — AB (ref 70–99)
GLUCOSE-CAPILLARY: 144 mg/dL — AB (ref 70–99)
GLUCOSE-CAPILLARY: 102 mg/dL — AB (ref 70–99)
GLUCOSE-CAPILLARY: 190 mg/dL — AB (ref 70–99)
GLUCOSE-CAPILLARY: 143 mg/dL — AB (ref 70–99)
GLUCOSE-CAPILLARY: 124 mg/dL — AB (ref 70–99)
GLUCOSE-CAPILLARY: 172 mg/dL — AB (ref 70–99)
Glucose-Capillary: 239 mg/dL — ABNORMAL HIGH (ref 70–99)
Glucose-Capillary: 261 mg/dL — ABNORMAL HIGH (ref 70–99)
Glucose-Capillary: 279 mg/dL — ABNORMAL HIGH (ref 70–99)
Glucose-Capillary: 223 mg/dL — ABNORMAL HIGH (ref 70–99)
Glucose-Capillary: 118 mg/dL — ABNORMAL HIGH (ref 70–99)
Glucose-Capillary: 146 mg/dL — ABNORMAL HIGH (ref 70–99)
Glucose-Capillary: 163 mg/dL — ABNORMAL HIGH (ref 70–99)
Glucose-Capillary: 202 mg/dL — ABNORMAL HIGH (ref 70–99)
Glucose-Capillary: 116 mg/dL — ABNORMAL HIGH (ref 70–99)
Glucose-Capillary: 149 mg/dL — ABNORMAL HIGH (ref 70–99)
Glucose-Capillary: 189 mg/dL — ABNORMAL HIGH (ref 70–99)

## 2014-01-07 LAB — BASIC METABOLIC PANEL
BUN: 13 mg/dL (ref 6–23)
BUN: 11 mg/dL (ref 6–23)
BUN: 10 mg/dL (ref 6–23)
BUN: 10 mg/dL (ref 6–23)
BUN: 9 mg/dL (ref 6–23)
CALCIUM: 8.8 mg/dL (ref 8.4–10.5)
CHLORIDE: 91 meq/L — AB (ref 96–112)
CO2: 28 mEq/L (ref 19–32)
CO2: 28 mEq/L (ref 19–32)
CO2: 28 meq/L (ref 19–32)
CO2: 29 meq/L (ref 19–32)
CO2: 25 meq/L (ref 19–32)
CREATININE: 0.97 mg/dL (ref 0.50–1.35)
CREATININE: 0.95 mg/dL (ref 0.50–1.35)
Calcium: 8.8 mg/dL (ref 8.4–10.5)
Calcium: 9 mg/dL (ref 8.4–10.5)
Calcium: 8.8 mg/dL (ref 8.4–10.5)
Calcium: 9.2 mg/dL (ref 8.4–10.5)
Chloride: 90 mEq/L — ABNORMAL LOW (ref 96–112)
Chloride: 94 mEq/L — ABNORMAL LOW (ref 96–112)
Chloride: 91 mEq/L — ABNORMAL LOW (ref 96–112)
Chloride: 94 mEq/L — ABNORMAL LOW (ref 96–112)
Creatinine, Ser: 1.05 mg/dL (ref 0.50–1.35)
Creatinine, Ser: 0.97 mg/dL (ref 0.50–1.35)
Creatinine, Ser: 0.95 mg/dL (ref 0.50–1.35)
GFR calc Af Amer: >90 mL/min (ref >90)
GFR calc Af Amer: >90 mL/min (ref >90)
GFR calc Af Amer: >90 mL/min (ref >90)
GFR calc non Af Amer: >90 mL/min (ref >90)
GFR calc non Af Amer: >90 mL/min (ref >90)
GFR calc non Af Amer: >90 mL/min (ref >90)
GFR calc non Af Amer: >90 mL/min (ref >90)
GFR, EST NON AFRICAN AMERICAN: 82 mL/min — AB (ref >90)
GLUCOSE: 236 mg/dL — AB (ref 70–99)
Glucose, Bld: 295 mg/dL — ABNORMAL HIGH (ref 70–99)
Glucose, Bld: 102 mg/dL — ABNORMAL HIGH (ref 70–99)
Glucose, Bld: 212 mg/dL — ABNORMAL HIGH (ref 70–99)
Glucose, Bld: 172 mg/dL — ABNORMAL HIGH (ref 70–99)
POTASSIUM: 4.2 meq/L (ref 3.7–5.3)
POTASSIUM: 4.1 meq/L (ref 3.7–5.3)
POTASSIUM: 4.1 meq/L (ref 3.7–5.3)
POTASSIUM: 4.4 meq/L (ref 3.7–5.3)
Potassium: 4.6 mEq/L (ref 3.7–5.3)
SODIUM: 131 meq/L — AB (ref 137–147)
SODIUM: 132 meq/L — AB (ref 137–147)
Sodium: 135 mEq/L — ABNORMAL LOW (ref 137–147)
Sodium: 132 mEq/L — ABNORMAL LOW (ref 137–147)
Sodium: 133 mEq/L — ABNORMAL LOW (ref 137–147)

## 2014-01-07 MED ORDER — DEXTROSE-NACL 5-0.45 % IV SOLN
INTRAVENOUS | Status: DC
  Administered 2014-01-07: 03:00:00 via INTRAVENOUS
  Administered 2014-01-07: 1000 mL via INTRAVENOUS
  Administered 2014-01-09: 150 mL/h via INTRAVENOUS
  Administered 2014-01-09: 04:00:00 via INTRAVENOUS

## 2014-01-07 MED ORDER — PRAVASTATIN SODIUM 40 MG PO TABS
80.0000 mg | ORAL_TABLET | Freq: Every day | ORAL | Status: DC
  Administered 2014-01-07 – 2014-01-09 (×3): 80 mg via ORAL
  Filled 2014-01-07 (×4): qty 2

## 2014-01-07 MED ORDER — SODIUM CHLORIDE 0.9 % IV SOLN: INTRAVENOUS | Status: DC

## 2014-01-07 MED ORDER — POTASSIUM CHLORIDE 10 MEQ/100ML IV SOLN
10.0000 meq | INTRAVENOUS | Status: AC
  Administered 2014-01-07 (×2): 10 meq via INTRAVENOUS
  Filled 2014-01-07 (×2): qty 100

## 2014-01-07 MED ORDER — AMLODIPINE BESYLATE 10 MG PO TABS
10.0000 mg | ORAL_TABLET | Freq: Every day | ORAL | Status: DC
  Administered 2014-01-07 – 2014-01-10 (×5): 10 mg via ORAL
  Filled 2014-01-07 (×5): qty 1

## 2014-01-07 MED ORDER — POTASSIUM CHLORIDE CRYS ER 20 MEQ PO TBCR
40.0000 meq | EXTENDED_RELEASE_TABLET | Freq: Once | ORAL | Status: AC
  Administered 2014-01-07: 40 meq via ORAL
  Filled 2014-01-07: qty 2

## 2014-01-07 MED ORDER — DEXTROSE 50 % IV SOLN: 25.0000 mL | INTRAVENOUS | Status: DC | PRN

## 2014-01-07 MED ORDER — SODIUM CHLORIDE 0.9 % IV SOLN
INTRAVENOUS | Status: DC
  Administered 2014-01-07: 5.7 [IU]/h via INTRAVENOUS
  Administered 2014-01-07: 1.7 [IU]/h via INTRAVENOUS
  Administered 2014-01-07: 4.2 [IU]/h via INTRAVENOUS
  Administered 2014-01-07: 1.8 [IU]/h via INTRAVENOUS
  Administered 2014-01-07: 3.4 [IU]/h via INTRAVENOUS
  Administered 2014-01-07: 2.6 [IU]/h via INTRAVENOUS
  Administered 2014-01-07: 2.7 [IU]/h via INTRAVENOUS
  Administered 2014-01-08: 3.6 [IU]/h via INTRAVENOUS
  Administered 2014-01-08: 4 [IU]/h via INTRAVENOUS
  Administered 2014-01-08: 18:00:00 via INTRAVENOUS
  Administered 2014-01-08: 3.9 [IU]/h via INTRAVENOUS
  Administered 2014-01-08: 4.1 [IU]/h via INTRAVENOUS
  Administered 2014-01-09 (×2): 1.6 [IU]/h via INTRAVENOUS
  Administered 2014-01-09: 4.2 [IU]/h via INTRAVENOUS
  Filled 2014-01-07 (×3): qty 1

## 2014-01-07 NOTE — Progress Notes (Signed)
Patient had placed himself on CPAP when RT entered room.  Patient is tolerating CPAP without any complications at this time. RT will continue to monitor.

## 2014-01-07 NOTE — Progress Notes (Signed)
Inpatient Diabetes Program Recommendations  AACE/ADA: New Consensus Statement on Inpatient Glycemic Control (2013)  Target Ranges:  Prepandial:   less than 140 mg/dL      Peak postprandial:   less than 180 mg/dL (1-2 hours)      Critically ill patients:  140 - 180 mg/dL     Results for Jeffrey [name], Jeffrey [name] (MRN [id number]) as of 01/07/2014 07:27  Ref. Range 01/07/2014 01:34  Sodium Latest Range: 137-147 mEq/L 131 (L)  Potassium Latest Range: 3.7-5.3 mEq/L 4.6  Chloride Latest Range: 96-112 mEq/L 90 (L)  CO2 Latest Range: 19-32 mEq/L 28  BUN Latest Range: 6-23 mg/dL 13  Creatinine Latest Range: 0.50-1.35 mg/dL 1.05  Calcium Latest Range: 8.4-10.5 mg/dL 8.8  GFR calc non Af Amer Latest Range: >90 mL/min 82 (L)  GFR calc Af Amer Latest Range: >90 mL/min >90  Glucose Latest Range: 70-99 mg/dL 295 (H)    **Pt was transitioned off IV insulin drip yesterday around 6pm.  Was given SQ Lantus 30 units at 5:47pm and insulin drip stopped ~8pm.  **Note pt was restarted on IV insulin drip this morning around 3am.  Unsure why?? CO2 on 1AM BMET was 28.   MD- Please make sure pt receives SQ Lantus 1-2 hours before IV insulin drip d/c'd again   Will follow. [name] RN, MSN, CDE Diabetes Coordinator Inpatient Diabetes Program Team Pager: [phone] (8a-10p)

## 2014-01-07 NOTE — Progress Notes (Signed)
Family Medicine Teaching Service Daily Progress Note Intern Pager: [phone]  Patient name: Jeffrey [name] Medical record number: [id number] Date of birth: [birth year] Age: 48 y.o. Gender: male  Primary Care Provider: Teresa Coombs, DO Consultants: none Code Status: full  Pt Overview and Major Events to Date:  3/31: DKA; AG 57, bicarb 7; Hyperkalemia s/p calcium gluconate; AKI 4/1: AG 17, Bicarb 25; HyperK resolved; AKI resolved 4/2: AG 14, BP increasing may need to restart BP meds 1 by 1 today  Assessment and Plan: Jeffrey [name] is a 48 y.o. male presenting with DKA . PMH is significant for type 2 diabetes, hypertension, OSA, ethanol abuse, anxiety, obesity, and hyperlipidemia.   DKA - Improving - Appears to be due to him being out of Lantus for one week and now out of regular insulin for 4 days as well. Possibly with concomitant viral gastro-as well  - AG 13 (57 on admit), bicarbonate 28 (admit = 7), corrected sodium 134 (Admit =151) - glucose stabilizer - Continue until Gap closed x 2 then transition back to home insulin regimen - Q4 BMPs   HTN  - BPs last 24: (122-168)/(57-95) 129/64 mmHg  - soft on admission due to volume depletion, now increasing - Will restart BP meds 1-by-1  - Restarted losartan at 1/2 original dose due to improving renal fcn (4/2) - restarted Metoprolol 4/2 @ decreased dose; may be able to wean  - restarted norvasc 4/2 ; Holding lasix (likely DC)  Hyperkalemia: Resolved - Likely due to cellular shifts; PT waves on EKG, has received calcium gluconate in the ED  - potassium currently added to IVFs  Hematemesis - Resolved  - Zofran prn  Acute kidney injury: Resolved - Likely due to prerenal injury, BUN/creatinine ratio 12.5  - Cr = 0.97 (Baseline ~1)  Ethanol abuse  -Ethanol negative - lipase = 99; possibly due to emesis on admission - ciwa: < 5  OSA - CPAP qHS  Anxiety - Continue trazadone Hyperlipidemia - statin  FEN/GI: Carb Mod,  IV D5 1/2NS at 150  Prophylaxis: Sub q heparin   Disposition: Step down for frequent labs and IV insulin until transitioned back to home insulin regimen  Subjective:  Still feels "dry" w/ some nausea.   Objective: Temp:  [97.4 F (36.3 C)-98.3 F (36.8 C)] 97.4 F (36.3 C) (04/03 0808) Pulse Rate:  [57-82] 80 (04/03 0808) Resp:  [8-23] 14 (04/03 0808) BP: (122-168)/(57-95) 129/64 mmHg (04/03 0952) SpO2:  [95 %-99 %] 98 % (04/03 0808) Weight:  [258 lb 2.5 oz (117.1 kg)] 258 lb 2.5 oz (117.1 kg) (04/03 0413) Physical Exam: Gen: NAD, alert, cooperative with exam HEENT: NCAT, EOMI, PERRL  CV: RRR, good S1/S2, no murmur Resp: CTABL, no wheezes, non-labored  Abd: SNTND, BS present, no guarding or organomegaly  Ext: No edema, warm  Neuro: Alert and oriented  Laboratory:  Recent Labs Lab 01/04/14 1254 01/05/14 0114  WBC 17.6* 17.5*  HGB 11.1* 10.4*  HCT 38.3* 33.6*  PLT 384 350    Recent Labs Lab 01/04/14 1254  01/06/14 2231 01/07/14 0134 01/07/14 0630  NA 133*  < > 132* 131* 132*  K 6.4*  < > 4.5 4.6 4.2  CL 69*  < > 91* 90* 91*  CO2 7*  < > 28 28 28   BUN 39*  < > 14 13 11   CREATININE 3.28*  < > 1.03 1.05 0.97  CALCIUM 9.2  < > 8.6 8.8 9.0  PROT 8.4*  --   --   --   --  BILITOT 0.7  --   --   --   --   ALKPHOS 99  --   --   --   --   ALT 23  --   --   --   --   AST 19  --   --   --   --   GLUCOSE 861*  < > 269* 295* 236*  < > = values in this interval not displayed.     Recent Labs Lab 01/04/14 1637 01/04/14 2157 01/05/14 0114  TROPONINI <0.30 <0.30 <0.30   Imaging/Diagnostic Tests: EKG 01/04/2014: Sinus tachycardia, peak T waves  Phill Myron, MD 01/07/2014, 10:27 AM PGY-1, [place] Intern pager: [phone], text pages welcome

## 2014-01-07 NOTE — Progress Notes (Signed)
CALL PAGER [phone] for any questions or notifications regarding this patient   FMTS Attending Daily Note: Jatavis Malek MD  Attending pager:[phone]  office [phone]  I  have seen and examined this patient, reviewed their chart. I have discussed this patient with the resident. I agree with the resident's findings, assessment and care plan. 

## 2014-01-08 ENCOUNTER — Inpatient Hospital Stay (HOSPITAL_COMMUNITY): Payer: BC Managed Care – PPO

## 2014-01-08 DIAGNOSIS — F411 Generalized anxiety disorder: Secondary | ICD-10-CM

## 2014-01-08 DIAGNOSIS — G931 Anoxic brain damage, not elsewhere classified: Secondary | ICD-10-CM

## 2014-01-08 LAB — BASIC METABOLIC PANEL
BUN: 9 mg/dL (ref 6–23)
BUN: 9 mg/dL (ref 6–23)
BUN: 9 mg/dL (ref 6–23)
BUN: 9 mg/dL (ref 6–23)
CALCIUM: 9.5 mg/dL (ref 8.4–10.5)
CALCIUM: 9.2 mg/dL (ref 8.4–10.5)
CALCIUM: 9.7 mg/dL (ref 8.4–10.5)
CHLORIDE: 94 meq/L — AB (ref 96–112)
CO2: 27 meq/L (ref 19–32)
CO2: 27 mEq/L (ref 19–32)
CO2: 26 mEq/L (ref 19–32)
CO2: 25 mEq/L (ref 19–32)
CREATININE: 0.97 mg/dL (ref 0.50–1.35)
Calcium: 8.9 mg/dL (ref 8.4–10.5)
Chloride: 91 mEq/L — ABNORMAL LOW (ref 96–112)
Chloride: 91 mEq/L — ABNORMAL LOW (ref 96–112)
Chloride: 91 mEq/L — ABNORMAL LOW (ref 96–112)
Creatinine, Ser: 0.92 mg/dL (ref 0.50–1.35)
Creatinine, Ser: 0.93 mg/dL (ref 0.50–1.35)
Creatinine, Ser: 1.04 mg/dL (ref 0.50–1.35)
GFR calc Af Amer: >90 mL/min (ref >90)
GFR calc Af Amer: >90 mL/min (ref >90)
GFR calc Af Amer: >90 mL/min (ref >90)
GFR calc Af Amer: >90 mL/min (ref >90)
GFR calc non Af Amer: >90 mL/min (ref >90)
GFR calc non Af Amer: >90 mL/min (ref >90)
GFR calc non Af Amer: >90 mL/min (ref >90)
GFR, EST NON AFRICAN AMERICAN: 83 mL/min — AB (ref >90)
GLUCOSE: 171 mg/dL — AB (ref 70–99)
GLUCOSE: 141 mg/dL — AB (ref 70–99)
GLUCOSE: 166 mg/dL — AB (ref 70–99)
Glucose, Bld: 177 mg/dL — ABNORMAL HIGH (ref 70–99)
POTASSIUM: 4.4 meq/L (ref 3.7–5.3)
Potassium: 4 mEq/L (ref 3.7–5.3)
Potassium: 4 mEq/L (ref 3.7–5.3)
Potassium: 4.6 mEq/L (ref 3.7–5.3)
SODIUM: 135 meq/L — AB (ref 137–147)
SODIUM: 131 meq/L — AB (ref 137–147)
Sodium: 133 mEq/L — ABNORMAL LOW (ref 137–147)
Sodium: 132 mEq/L — ABNORMAL LOW (ref 137–147)

## 2014-01-08 LAB — GLUCOSE, CAPILLARY
GLUCOSE-CAPILLARY: 118 mg/dL — AB (ref 70–99)
GLUCOSE-CAPILLARY: 129 mg/dL — AB (ref 70–99)
GLUCOSE-CAPILLARY: 130 mg/dL — AB (ref 70–99)
GLUCOSE-CAPILLARY: 152 mg/dL — AB (ref 70–99)
GLUCOSE-CAPILLARY: 158 mg/dL — AB (ref 70–99)
GLUCOSE-CAPILLARY: 163 mg/dL — AB (ref 70–99)
GLUCOSE-CAPILLARY: 172 mg/dL — AB (ref 70–99)
GLUCOSE-CAPILLARY: 190 mg/dL — AB (ref 70–99)
GLUCOSE-CAPILLARY: 199 mg/dL — AB (ref 70–99)
GLUCOSE-CAPILLARY: 201 mg/dL — AB (ref 70–99)
Glucose-Capillary: 152 mg/dL — ABNORMAL HIGH (ref 70–99)
Glucose-Capillary: 160 mg/dL — ABNORMAL HIGH (ref 70–99)
Glucose-Capillary: 144 mg/dL — ABNORMAL HIGH (ref 70–99)
Glucose-Capillary: 149 mg/dL — ABNORMAL HIGH (ref 70–99)
Glucose-Capillary: 161 mg/dL — ABNORMAL HIGH (ref 70–99)
Glucose-Capillary: 128 mg/dL — ABNORMAL HIGH (ref 70–99)
Glucose-Capillary: 128 mg/dL — ABNORMAL HIGH (ref 70–99)
Glucose-Capillary: 144 mg/dL — ABNORMAL HIGH (ref 70–99)
Glucose-Capillary: 196 mg/dL — ABNORMAL HIGH (ref 70–99)
Glucose-Capillary: 217 mg/dL — ABNORMAL HIGH (ref 70–99)
Glucose-Capillary: 171 mg/dL — ABNORMAL HIGH (ref 70–99)

## 2014-01-08 LAB — LACTIC ACID, PLASMA: Lactic Acid, Venous: 0.9 mmol/L (ref 0.5–2.2)

## 2014-01-08 MED ORDER — SODIUM CHLORIDE 0.9 % IV BOLUS (SEPSIS)
1000.0000 mL | Freq: Once | INTRAVENOUS | Status: AC
  Administered 2014-01-08: 1000 mL via INTRAVENOUS

## 2014-01-08 MED ORDER — POTASSIUM CHLORIDE 10 MEQ/100ML IV SOLN
10.0000 meq | INTRAVENOUS | Status: AC
  Administered 2014-01-08 – 2014-01-09 (×2): 10 meq via INTRAVENOUS
  Filled 2014-01-08 (×2): qty 100

## 2014-01-08 NOTE — Progress Notes (Signed)
Paged and Notified Dr. Skeet Simmer of pt's progress on insulin drip. Dr.Marsh aware of pt status. Will continue to keep order going. Will follow up in the morning shift.

## 2014-01-08 NOTE — Progress Notes (Signed)
Pt 2 peripheral IVs change due; attempts made; IV team requested to restart IV; will continue to monitor

## 2014-01-08 NOTE — Progress Notes (Signed)
Family Medicine Teaching Service Daily Progress Note Intern Pager: [phone]  Patient name: Jeffrey [name] Medical record number: [id number] Date of birth: Jan 02, 1966 Age: 48 y.o. Gender: male  Primary Care Provider: Gerda Diss, DO Consultants: none Code Status: full  Pt Overview and Major Events to Date:  3/31: DKA; AG 57, bicarb 7; Hyperkalemia s/p calcium gluconate; AKI 4/1: AG 17, Bicarb 25; HyperK resolved; AKI resolved 4/2: AG 14, BP increasing may need to restart BP meds 1 by 1 today  Assessment and Plan: Jeffrey [name] is a 48 y.o. male presenting with DKA . PMH is significant for type 2 diabetes, hypertension, OSA, ethanol abuse, anxiety, obesity, and hyperlipidemia.   DKA - Improving - Appears to be due to him being out of Lantus for one week and now out of regular insulin for 4 days as well. Possibly with concomitant viral gastro-as well  - AG 15 (57 on admit) - glucose stabilizer - Continue until Gap closed x 2 then transition back to home insulin regimen.  Has been closing then re-opening - still down 10# with net neg UOP (dry wt ~270 current 258).  Bolus 1L NS and encourage PO - Q4 BMPs   HTN  - BPs last 24: (122-168)/(57-95) 129/64 mmHg  - soft on admission due to volume depletion, now increasing - Will restart BP meds 1-by-1  - Restarted losartan at 1/2 original dose due to improving renal fcn (4/2) - restarted Metoprolol 4/2 @ decreased dose; may be able to wean  - restarted norvasc 4/2 ; Holding lasix (likely DC)  Hyperkalemia: Resolved - Likely due to cellular shifts; PT waves on EKG, has received calcium gluconate in the ED  - potassium currently added to IVFs  Hematemesis - Resolved  - Zofran prn  Acute kidney injury: Resolved - Likely due to prerenal injury, BUN/creatinine ratio 12.5  - Cr = 0.97 (Baseline ~1)  Ethanol abuse  - Ethanol negative - lipase = 99; possibly due to emesis on admission - ciwa: < 5  OSA - CPAP qHS  Anxiety -  Continue trazadone Hyperlipidemia - statin  FEN/GI: Carb Mod, IV D5 1/2NS at 150  Prophylaxis: Sub q heparin   Disposition: Step down for frequent labs and IV insulin until transitioned back to home insulin regimen  Subjective:  Still feels "dry" w/ some nausea.   Objective: Temp:  [97.4 F (36.3 C)-98.8 F (37.1 C)] 97.9 F (36.6 C) (04/04 0446) Pulse Rate:  [63-80] 63 (04/04 0743) Resp:  [14-24] 16 (04/04 0743) BP: (120-144)/(64-99) 131/74 mmHg (04/04 0743) SpO2:  [98 %-100 %] 98 % (04/04 0743) Physical Exam: Gen: NAD, alert, cooperative with exam HEENT: NCAT, EOMI, PERRL, no JVD CV: RRR, good S1/S2, no murmur Resp: CTABL, no wheezes, non-labored  Abd: SNTND, BS present, no guarding or organomegaly  Ext: No edema, warm  Neuro: Alert and oriented  Laboratory:  Recent Labs Lab 01/04/14 1254 01/05/14 0114  WBC 17.6* 17.5*  HGB 11.1* 10.4*  HCT 38.3* 33.6*  PLT 384 350    Recent Labs Lab 01/04/14 1254  01/07/14 2210 01/08/14 0155 01/08/14 0600  NA 133*  < > 135* 133* 132*  K 6.4*  < > 4.4 4.0 4.0  CL 69*  < > 94* 91* 91*  CO2 7*  < > 27 27 26   BUN 39*  < > 9 9 9   CREATININE 3.28*  < > 0.92 0.93 0.97  CALCIUM 9.2  < > 8.9 9.5 9.2  PROT 8.4*  --   --   --   --  BILITOT 0.7  --   --   --   --   ALKPHOS 99  --   --   --   --   ALT 23  --   --   --   --   AST 19  --   --   --   --   GLUCOSE 861*  < > 171* 141* 166*  < > = values in this interval not displayed.     Recent Labs Lab 01/04/14 1637 01/04/14 2157 01/05/14 0114  TROPONINI <0.30 <0.30 <0.30   Imaging/Diagnostic Tests: EKG 01/04/2014: Sinus tachycardia, peak T waves  Gerda Diss, DO 01/08/2014, 8:00 AM PGY-3, [place] Intern pager: [phone], text pages welcome

## 2014-01-08 NOTE — Progress Notes (Signed)
CALL PAGER [phone] for any questions or notifications regarding this patient   FMTS Attending Daily Note: Dorcas Mcmurray MD  Attending pager:[phone]  office [phone]  I  have seen and examined this patient, reviewed their chart. I have discussed this patient with the resident. I agree with the resident's findings, assessment and care plan. His anion gap opened up a little bit again yesterday. Not sure why. He does have a white count of 17,000 but no signs or symptoms of acute infection. We will recheck urine, blood culture. Could be we just have not fully completed his rehydration. He'll need a sample bottle of insulin on discharge as he uses mail order pharmacy.

## 2014-01-09 LAB — BASIC METABOLIC PANEL
BUN: 9 mg/dL (ref 6–23)
BUN: 9 mg/dL (ref 6–23)
BUN: 9 mg/dL (ref 6–23)
BUN: 9 mg/dL (ref 6–23)
BUN: 10 mg/dL (ref 6–23)
CALCIUM: 9 mg/dL (ref 8.4–10.5)
CALCIUM: 9.1 mg/dL (ref 8.4–10.5)
CHLORIDE: 95 meq/L — AB (ref 96–112)
CHLORIDE: 93 meq/L — AB (ref 96–112)
CO2: 25 mEq/L (ref 19–32)
CO2: 24 meq/L (ref 19–32)
CO2: 27 meq/L (ref 19–32)
CO2: 25 meq/L (ref 19–32)
CO2: 21 mEq/L (ref 19–32)
CREATININE: 1.1 mg/dL (ref 0.50–1.35)
CREATININE: 1.04 mg/dL (ref 0.50–1.35)
CREATININE: 1.04 mg/dL (ref 0.50–1.35)
Calcium: 9.4 mg/dL (ref 8.4–10.5)
Calcium: 9.5 mg/dL (ref 8.4–10.5)
Calcium: 9.1 mg/dL (ref 8.4–10.5)
Chloride: 92 mEq/L — ABNORMAL LOW (ref 96–112)
Chloride: 91 mEq/L — ABNORMAL LOW (ref 96–112)
Chloride: 90 mEq/L — ABNORMAL LOW (ref 96–112)
Creatinine, Ser: 1.1 mg/dL (ref 0.50–1.35)
Creatinine, Ser: 1.18 mg/dL (ref 0.50–1.35)
GFR calc Af Amer: >90 mL/min (ref >90)
GFR calc Af Amer: 89 mL/min — ABNORMAL LOW (ref >90)
GFR calc Af Amer: 82 mL/min — ABNORMAL LOW (ref >90)
GFR calc non Af Amer: 83 mL/min — ABNORMAL LOW (ref >90)
GFR calc non Af Amer: 83 mL/min — ABNORMAL LOW (ref >90)
GFR calc non Af Amer: 77 mL/min — ABNORMAL LOW (ref >90)
GFR calc non Af Amer: 77 mL/min — ABNORMAL LOW (ref >90)
GFR, EST AFRICAN AMERICAN: 89 mL/min — AB (ref >90)
GFR, EST NON AFRICAN AMERICAN: 71 mL/min — AB (ref >90)
GLUCOSE: 222 mg/dL — AB (ref 70–99)
Glucose, Bld: 146 mg/dL — ABNORMAL HIGH (ref 70–99)
Glucose, Bld: 190 mg/dL — ABNORMAL HIGH (ref 70–99)
Glucose, Bld: 94 mg/dL (ref 70–99)
Glucose, Bld: 373 mg/dL — ABNORMAL HIGH (ref 70–99)
POTASSIUM: 4.4 meq/L (ref 3.7–5.3)
POTASSIUM: 4.1 meq/L (ref 3.7–5.3)
POTASSIUM: 5.3 meq/L (ref 3.7–5.3)
Potassium: 4.2 mEq/L (ref 3.7–5.3)
Potassium: 4.6 mEq/L (ref 3.7–5.3)
SODIUM: 131 meq/L — AB (ref 137–147)
Sodium: 132 mEq/L — ABNORMAL LOW (ref 137–147)
Sodium: 131 mEq/L — ABNORMAL LOW (ref 137–147)
Sodium: 134 mEq/L — ABNORMAL LOW (ref 137–147)
Sodium: 127 mEq/L — ABNORMAL LOW (ref 137–147)

## 2014-01-09 LAB — GLUCOSE, CAPILLARY
GLUCOSE-CAPILLARY: 144 mg/dL — AB (ref 70–99)
GLUCOSE-CAPILLARY: 164 mg/dL — AB (ref 70–99)
GLUCOSE-CAPILLARY: 178 mg/dL — AB (ref 70–99)
GLUCOSE-CAPILLARY: 166 mg/dL — AB (ref 70–99)
GLUCOSE-CAPILLARY: 87 mg/dL (ref 70–99)
GLUCOSE-CAPILLARY: 181 mg/dL — AB (ref 70–99)
GLUCOSE-CAPILLARY: 389 mg/dL — AB (ref 70–99)
Glucose-Capillary: 125 mg/dL — ABNORMAL HIGH (ref 70–99)
Glucose-Capillary: 132 mg/dL — ABNORMAL HIGH (ref 70–99)
Glucose-Capillary: 157 mg/dL — ABNORMAL HIGH (ref 70–99)
Glucose-Capillary: 159 mg/dL — ABNORMAL HIGH (ref 70–99)
Glucose-Capillary: 159 mg/dL — ABNORMAL HIGH (ref 70–99)
Glucose-Capillary: 175 mg/dL — ABNORMAL HIGH (ref 70–99)
Glucose-Capillary: 191 mg/dL — ABNORMAL HIGH (ref 70–99)
Glucose-Capillary: 196 mg/dL — ABNORMAL HIGH (ref 70–99)

## 2014-01-09 LAB — URINALYSIS, ROUTINE W REFLEX MICROSCOPIC
Bilirubin Urine: NEGATIVE
Glucose, UA: 500 mg/dL — AB
Hgb urine dipstick: NEGATIVE
Ketones, ur: NEGATIVE mg/dL
LEUKOCYTES UA: NEGATIVE
NITRITE: NEGATIVE
PH: 6.5 (ref 5.0–8.0)
Protein, ur: NEGATIVE mg/dL
SPECIFIC GRAVITY, URINE: 1.006 (ref 1.005–1.030)
Urobilinogen, UA: 0.2 mg/dL (ref 0.0–1.0)

## 2014-01-09 LAB — KETONES, QUALITATIVE: Acetone, Bld: NEGATIVE

## 2014-01-09 MED ORDER — METOPROLOL TARTRATE 12.5 MG HALF TABLET
12.5000 mg | ORAL_TABLET | Freq: Two times a day (BID) | ORAL | Status: DC
  Administered 2014-01-09: 12.5 mg via ORAL
  Filled 2014-01-09 (×2): qty 1

## 2014-01-09 MED ORDER — INSULIN ASPART 100 UNIT/ML ~~LOC~~ SOLN
0.0000 [IU] | Freq: Every day | SUBCUTANEOUS | Status: DC
  Administered 2014-01-09: 5 [IU] via SUBCUTANEOUS

## 2014-01-09 MED ORDER — INSULIN ASPART 100 UNIT/ML ~~LOC~~ SOLN
4.0000 [IU] | Freq: Three times a day (TID) | SUBCUTANEOUS | Status: DC
  Administered 2014-01-09 – 2014-01-10 (×3): 4 [IU] via SUBCUTANEOUS

## 2014-01-09 MED ORDER — INSULIN GLARGINE 100 UNIT/ML SOLOSTAR PEN: 35.0000 [IU] | PEN_INJECTOR | Freq: Two times a day (BID) | SUBCUTANEOUS | Status: DC

## 2014-01-09 MED ORDER — PANTOPRAZOLE SODIUM 40 MG PO TBEC
40.0000 mg | DELAYED_RELEASE_TABLET | Freq: Every day | ORAL | Status: DC
  Administered 2014-01-09 – 2014-01-10 (×2): 40 mg via ORAL
  Filled 2014-01-09 (×2): qty 1

## 2014-01-09 MED ORDER — INSULIN ASPART 100 UNIT/ML ~~LOC~~ SOLN
0.0000 [IU] | Freq: Three times a day (TID) | SUBCUTANEOUS | Status: DC
  Administered 2014-01-09: 3 [IU] via SUBCUTANEOUS
  Administered 2014-01-10 (×2): 5 [IU] via SUBCUTANEOUS

## 2014-01-09 MED ORDER — LOSARTAN POTASSIUM 50 MG PO TABS
100.0000 mg | ORAL_TABLET | Freq: Every day | ORAL | Status: DC
  Administered 2014-01-09 – 2014-01-10 (×2): 100 mg via ORAL
  Filled 2014-01-09 (×2): qty 2

## 2014-01-09 MED ORDER — LOSARTAN POTASSIUM 50 MG PO TABS: 100.0000 mg | ORAL_TABLET | Freq: Every day | ORAL | Status: DC

## 2014-01-09 MED ORDER — INSULIN GLARGINE 100 UNIT/ML ~~LOC~~ SOLN
35.0000 [IU] | Freq: Two times a day (BID) | SUBCUTANEOUS | Status: DC
  Administered 2014-01-09 – 2014-01-10 (×3): 35 [IU] via SUBCUTANEOUS
  Filled 2014-01-09 (×7): qty 0.35

## 2014-01-09 NOTE — Progress Notes (Signed)
With last blood draw - Anion gap now 12. Pt still on glucostabilizer

## 2014-01-09 NOTE — Progress Notes (Signed)
CALL PAGER [phone] for any questions or notifications regarding this patient   FMTS Attending Daily Note: Dorcas Mcmurray MD Attending pager: [phone]  office [phone] I have discussed this patient with the resident and reviewed the assessment and plan as documented above. I agree with the resident's findings and plan.

## 2014-01-09 NOTE — Progress Notes (Signed)
CBG done ready to come off Insulin gtt call to MD for sliding scale orders received, 3 U SQ insulin given per sliding scale.

## 2014-01-09 NOTE — Progress Notes (Signed)
Family Medicine Teaching Service Daily Progress Note Intern Pager: [phone]  Patient name: Jeffrey [name] Medical record number: [id number] Date of birth: Mar 21, 1966 Age: 48 y.o. Gender: male  Primary Care Provider: Gerda Diss, DO Consultants: none Code Status: full  Pt Overview and Major Events to Date:  3/31: DKA; AG 57, bicarb 7; Hyperkalemia s/p calcium gluconate; AKI 4/1: AG 17, Bicarb 25; HyperK resolved; AKI resolved 4/2: AG 14, BP increasing may need to restart BP meds 1 by 1 today 4/3: Transitioned to Subq insulin - Gap reopened - Back to glucostabilizer 4/5: Gap remains open at [place] D5 1/2NS to 150 cc; Check blood gas and serum ketones  Assessment and Plan: Jeffrey [name] is a 48 y.o. male presenting with DKA . PMH is significant for type 2 diabetes, hypertension, OSA, ethanol abuse, anxiety, obesity, and hyperlipidemia.   DKA - Improving - Appears to be due to him being out of Lantus for one week and now out of regular insulin for 4 days as well. Possibly with concomitant viral gastro-as well  - AG 15 (57 on admit) - glucose stabilizer - Continue until Gap closed x 2 then transition back to home insulin regimen.  Has been closing then re-opening - still down 10# with net neg UOP (dry wt ~270 current 258).   - Inc D5 to 150cc/hr, and check serum ketones & blood gas - Q4 BMPs   HTN  - BPs last 24: (116-162)/(66-104) 116/67 mmHg - soft on admission due to volume depletion, now increasing - Will restart BP meds 1-by-1  - Inc losartan to original dose (100 mg qd 4/5 - Discontinued Metoprolol 4/5 - had not taken in 1 month prior to admission - restarted norvasc 4/2 ; Holding lasix (likely DC)  Hyperkalemia: Resolved - Likely due to cellular shifts; PT waves on EKG, has received calcium gluconate in the ED  - potassium currently added to IVFs  Hematemesis - Resolved  - Zofran prn  Acute kidney injury: Resolved - Likely due to prerenal injury,  BUN/creatinine ratio 12.5  - Cr = 1.04 (Baseline ~1)  Ethanol abuse  - Ethanol negative - lipase = 99; possibly due to emesis on admission - ciwa: < 5  OSA - CPAP qHS  Anxiety - Continue trazadone Hyperlipidemia - statin  FEN/GI: Carb Mod, IV D5 1/2NS at 150  Prophylaxis: Sub q heparin   Disposition: Step down for frequent labs and IV insulin until transitioned back to home insulin regimen  Subjective:  Feels great. No complaints.   Objective: Temp:  [97.8 F (36.6 C)-98.1 F (36.7 C)] 98.1 F (36.7 C) (04/05 0807) Pulse Rate:  [64-83] 77 (04/05 0807) Resp:  [12-20] 17 (04/05 0807) BP: (116-162)/(66-104) 116/67 mmHg (04/05 0807) SpO2:  [99 %-100 %] 100 % (04/05 0807) Physical Exam: Gen: NAD, alert, cooperative with exam HEENT: NCAT, EOMI, PERRL, no JVD CV: RRR, good S1/S2, no murmur Resp: CTABL, no wheezes, non-labored  Abd: SNTND, BS present, no guarding or organomegaly  Ext: No edema, warm  Neuro: Alert and oriented  Laboratory:  Recent Labs Lab 01/04/14 1254 01/05/14 0114  WBC 17.6* 17.5*  HGB 11.1* 10.4*  HCT 38.3* 33.6*  PLT 384 350    Recent Labs Lab 01/04/14 1254  01/08/14 1750 01/09/14 0030 01/09/14 0640  NA 133*  < > 131* 132* 131*  K 6.4*  < > 4.6 4.2 4.4  CL 69*  < > 91* 92* 91*  CO2 7*  < > 25  25 25  BUN 39*  < > 9 9 9   CREATININE 3.28*  < > 1.04 1.04 1.04  CALCIUM 9.2  < > 9.7 9.1 9.4  PROT 8.4*  --   --   --   --   BILITOT 0.7  --   --   --   --   ALKPHOS 99  --   --   --   --   ALT 23  --   --   --   --   AST 19  --   --   --   --   GLUCOSE 861*  < > 177* 146* 190*  < > = values in this interval not displayed.     Recent Labs Lab 01/04/14 1637 01/04/14 2157 01/05/14 0114  TROPONINI <0.30 <0.30 <0.30   Imaging/Diagnostic Tests: EKG 01/04/2014: Sinus tachycardia, peak T waves  Phill Myron, MD 01/09/2014, 8:25 AM PGY-3, [place] Intern pager: [phone], text pages welcome

## 2014-01-10 ENCOUNTER — Telehealth: Payer: Self-pay | Admitting: Sports Medicine

## 2014-01-10 LAB — BASIC METABOLIC PANEL
BUN: 11 mg/dL (ref 6–23)
CALCIUM: 9.5 mg/dL (ref 8.4–10.5)
CHLORIDE: 94 meq/L — AB (ref 96–112)
CO2: 21 mEq/L (ref 19–32)
Creatinine, Ser: 1.15 mg/dL (ref 0.50–1.35)
GFR calc Af Amer: 85 mL/min — ABNORMAL LOW (ref >90)
GFR calc non Af Amer: 73 mL/min — ABNORMAL LOW (ref >90)
Glucose, Bld: 268 mg/dL — ABNORMAL HIGH (ref 70–99)
Potassium: 5.2 mEq/L (ref 3.7–5.3)
Sodium: 132 mEq/L — ABNORMAL LOW (ref 137–147)

## 2014-01-10 LAB — GLUCOSE, CAPILLARY
Glucose-Capillary: 352 mg/dL — ABNORMAL HIGH (ref 70–99)
Glucose-Capillary: 241 mg/dL — ABNORMAL HIGH (ref 70–99)
Glucose-Capillary: 227 mg/dL — ABNORMAL HIGH (ref 70–99)

## 2014-01-10 MED ORDER — INSULIN LISPRO 100 UNIT/ML CARTRIDGE: SUBCUTANEOUS | Status: DC

## 2014-01-10 MED ORDER — INSULIN PEN NEEDLE 31G X 5 MM MISC: Status: DC

## 2014-01-10 MED ORDER — INSULIN GLARGINE 100 UNIT/ML SOLOSTAR PEN: 35.0000 [IU] | PEN_INJECTOR | Freq: Two times a day (BID) | SUBCUTANEOUS | Status: DC

## 2014-01-10 MED ORDER — INSULIN GLARGINE 100 UNIT/ML SOLOSTAR PEN
35.0000 [IU] | PEN_INJECTOR | Freq: Two times a day (BID) | SUBCUTANEOUS | Status: DC
Start: 2014-01-10 — End: 2014-01-10

## 2014-01-10 MED ORDER — LOSARTAN POTASSIUM 100 MG PO TABS: 100.0000 mg | ORAL_TABLET | Freq: Every day | ORAL | Status: DC

## 2014-01-10 MED ORDER — AMLODIPINE BESYLATE 10 MG PO TABS: 10.0000 mg | ORAL_TABLET | Freq: Every day | ORAL | Status: DC

## 2014-01-10 MED ORDER — "INSULIN SYRINGE-NEEDLE U-100 30G X 1/2"" 0.3 ML MISC": Status: DC

## 2014-01-10 NOTE — Progress Notes (Signed)
Pt stated he would put Cpap on hisself when ready.

## 2014-01-10 NOTE — Discharge Instructions (Signed)
Diabetic Ketoacidosis °Diabetic ketoacidosis (DKA) is a life-threatening complication of type 1 diabetes. It must be quickly recognized and treated. Treatment requires hospitalization. °CAUSES  °When there is no insulin in the body, glucose (sugar) cannot be used and the body breaks down fat for energy. When fat breaks down, acids (ketones) build up in the blood. Very high levels of glucose and high levels of acids lead to severe loss of body fluids (dehydration) and other dangerous chemical changes. This stresses your vital organs and can cause coma or death. °SYMPTOMS  °· Tiredness (fatigue). °· Weight loss. °· Excessive thirst. °· Ketones in the urine. °· Lightheadedness. °· Fruity or sweet smell on your breath. °· Excessive urination. °· Visual changes. °· Confusion or irritability. °· Feeling sick to your stomach (nauseous) or vomiting. °· Rapid breathing. °· Stomachache or belly (abdominal) pain. °DIAGNOSIS  °Your caregiver will diagnose DKA based on your history, physical exam, and blood tests. Your caregiver will check if there is another illness present which caused you to go into DKA. Most of this will be done quickly in an emergency room. °TREATMENT  °· Fluid replacement to correct dehydration. °· Insulin. °· Correction of electrolytes, such as potassium and sodium. °· Medicines (antibiotics) that kill germs for infections. °PREVENTION °· Always take your insulin. Do not skip your insulin injections. °· If you are ill, treat yourself quickly. Your body often needs more insulin to fight the illness. °· Check your blood glucose regularly. °· Check urine ketones if your blood glucose is greater than 240 milligrams per deciliter (mg/dl). °· Do not used expired or outdated insulin. °· If your blood glucose is high, drink plenty of fluids. This helps flush out ketones. °HOME CARE INSTRUCTIONS  °· If you are ill, follow the advice of your caregiver. °· To prevent loss of body fluids (dehydration), drink enough  water and fluids to keep your urine clear or pale yellow. °· If you cannot eat, alternate between drinking fluids with sugar (soda, juices, flavored gelatin) and salty fluids (broth, bouillon). °· If you can eat, follow your usual diet and drink sugar-free liquids (water, diet drinks). °· Always take your usual dose of insulin. If you cannot eat, or your glucose is getting too low, call your caregiver for further instructions. °· Continue to monitor your blood or urine ketones every 3 to 4 hours around the clock. Set your alarm clock or have someone wake you up. If you are too sick, have someone test it for you. °· Rest and avoid exercise. °SEEK MEDICAL CARE IF:  °· You have ketones in your urine or your blood glucose is higher than a level your caregiver suggests. You may need extra insulin. Call your caregiver if you need advice on adjusting your insulin. °· You cannot drink at least a tablespoon of fluid every 15 to 20 minutes. °· You have been throwing up for more than 2 hours. °· You have symptoms of DKA: °· Fruity smelling breath. °· Breathing faster or slower. °· Becoming very sleepy. °SEEK IMMEDIATE MEDICAL CARE IF:  °· You have signs of dehydration: °· Decreased urination. °· Increased thirst. °· Dry skin and mouth. °· Lightheadedness. °· Your blood glucose is very high (as advised by your caregiver) twice in a row. °· You or your child has an oral temperature above 102° F (38.9° C), not controlled by medicine. °· You pass out. °· You have chest pain and/or trouble breathing. °· You have a sudden, severe headache. °· You have sudden   weakness in one arm and/or one leg. °· You have sudden difficulty speaking and/or swallowing. °· You develop vomiting and/or diarrhea that is getting worse after 3 to 4 hours. °· You have abdominal pain. °MAKE SURE YOU:  °· Understand these instructions. °· Will watch your condition. °· Will get help right away if you are not doing well or get worse. °Document Released:  09/20/2000 Document Revised: 12/16/2011 Document Reviewed: 03/29/2009 °[employer]® Patient Information ©2014 [employer]. ° °

## 2014-01-10 NOTE — Progress Notes (Signed)
Pt discharge teaching performed.  Patient provided with written education materials and prescriptions.  Patient is stable and able to discharge home at this time.

## 2014-01-10 NOTE — Telephone Encounter (Signed)
Pt came by office. He was released from hospital today. He needs a drs note so he can return to work on Wedesday [phone] Please call when ready

## 2014-01-10 NOTE — Discharge Summary (Signed)
I discussed with  Dr Berkley Harvey.  I agree with their plans documented in their discharge note for today.

## 2014-01-10 NOTE — Progress Notes (Signed)
Inpatient Diabetes Program Recommendations  AACE/ADA: New Consensus Statement on Inpatient Glycemic Control (2013)  Target Ranges:  Prepandial:   less than 140 mg/dL      Peak postprandial:   less than 180 mg/dL (1-2 hours)      Critically ill patients:  140 - 180 mg/dL   Results for Jeffrey [name], Jeffrey [name] (MRN [id number]) as of 01/10/2014 10:26  Ref. Range 01/09/2014 12:26 01/09/2014 14:36 01/09/2014 16:09 01/09/2014 22:18 01/10/2014 01:23 01/10/2014 08:31  Glucose-Capillary Latest Range: 70-99 mg/dL 159 (H) 196 (H) 181 (H) 389 (H) 352 (H) 241 (H)   Diabetes history: DM2  Outpatient Diabetes medications: Lantus 35 units BID, Humalog 5 units + SSI TID with meals  Current orders for Inpatient glycemic control: Lantus 35 units BID, Novolog 0-15 units AC, Novolog 0-5 units HS, Novolog 4 units TID with meals  Inpatient Diabetes Program Recommendations Insulin - Basal: Please consider increasing Lantus to 38 units BID. Insulin - Meal Coverage: Please consider increasing Novolog meal coverage to 6 units TID with meals.  Thanks, Barnie Alderman, RN, MSN, CCRN Diabetes Coordinator Inpatient Diabetes Program [phone] (Team Pager) [phone] (AP office) [phone] [facility] office)

## 2014-01-11 NOTE — Telephone Encounter (Signed)
Letter written and placed upfront for pick up.Wife states she'll pick it up.Thank you .Timothey Dahlstrom, [name]

## 2014-01-12 ENCOUNTER — Telehealth: Payer: Self-pay | Admitting: Sports Medicine

## 2014-01-12 NOTE — Telephone Encounter (Signed)
Wife called and her husband needs refills on his test strips for the [place] two. jw

## 2014-01-14 ENCOUNTER — Encounter: Payer: Self-pay | Admitting: Family Medicine

## 2014-01-14 ENCOUNTER — Ambulatory Visit (INDEPENDENT_AMBULATORY_CARE_PROVIDER_SITE_OTHER): Payer: BC Managed Care – PPO | Admitting: Family Medicine

## 2014-01-14 VITALS — BP 123/75 | HR 77 | Temp 97.7°F | Ht 70.0 in | Wt 264.7 lb

## 2014-01-14 DIAGNOSIS — E119 Type 2 diabetes mellitus without complications: Secondary | ICD-10-CM

## 2014-01-14 DIAGNOSIS — I10 Essential (primary) hypertension: Secondary | ICD-10-CM

## 2014-01-14 LAB — CULTURE, BLOOD (ROUTINE X 2)
Culture: NO GROWTH
Culture: NO GROWTH

## 2014-01-14 NOTE — Patient Instructions (Signed)
Jeffrey [name], it was a pleasure seeing you today. Today we talked about Hypertension and Diabetes.   1. Hypertension: Your blood pressure today was 123/75l. That is great! Please keep taking your cozaar and amlodipine. 2. Diabetes: Great job with your blood sugars. We will make some changes today. Please take Lantus 30 units twice a day from now on and keep your humalog dose (5 units before meals) the same. Please keep your sliding scale the same.  Please make an appointment to see Dr. Paulla Fore in 1-2 weeks  If you have any questions or concerns, please do not hesitate to call the office at [phone].  Sincerely,  Cordelia Poche, MD

## 2014-01-17 MED ORDER — GLUCOSE BLOOD VI DISK: 1.0000 | DISK | Freq: Three times a day (TID) | Status: DC

## 2014-01-17 NOTE — Telephone Encounter (Signed)
Sent to both local pharmacy and mail order

## 2014-01-18 ENCOUNTER — Telehealth: Payer: Self-pay | Admitting: Sports Medicine

## 2014-01-18 NOTE — Progress Notes (Signed)
   Subjective:    Patient ID: Jeffrey [name], male    DOB: 1966-03-02, 48 y.o.   MRN: [id number]  HPI  Patient presents today for hospital follow-up after admission for DKA. Patient has uncontrolled Type 2 diabetes currently on insulin regimen of Lantus 35units BID and Humalog 5 units qAC with sliding scale of +1 for CBG every 50 above 150. He has not had any Humalog since two days ago and has been controlling his blood sugar with Lantus and diet modification (glucose monitor readings provided below). He also has uncontrolled hypertension currently on amlodipine and losartan. No headaches, chest pain or shortness of breath. Patient adherent to regimen. No side effects.  Social history: Never smoker  Review of Systems  Respiratory: Negative for shortness of breath.   Cardiovascular: Negative for chest pain and palpitations.  Neurological: Negative for headaches.  All other systems reviewed and are negative.      Objective:   Physical Exam  Constitutional: He is oriented to person, place, and time. He appears well-developed and well-nourished.  Cardiovascular: Normal rate, regular rhythm, normal heart sounds and normal pulses.  PMI is not displaced.   Pulmonary/Chest: Effort normal and breath sounds normal. He has no decreased breath sounds. He has no wheezes.  Neurological: He is alert and oriented to person, place, and time.  Skin: Skin is warm and dry.       Assessment & Plan:

## 2014-01-18 NOTE — Assessment & Plan Note (Addendum)
Patient's blood pressure controlled today. Will not restart metoprolol or lasix. Will continue amlodipine and losartan. Follow-up with PCP.

## 2014-01-18 NOTE — Telephone Encounter (Signed)
Refill request for [employer] 2 test strips & lancets. Please call patient one send to [facility] on [address]

## 2014-01-18 NOTE — Assessment & Plan Note (Signed)
Patient not currently taking short acting insulin. Seems to be adequately controlled on long acting regimen and diet modification. Will decrease to Lantus 30u BID and have patient continue with 5u Humalog before meals with hopeful goal of having more equal long/short acting regimen.

## 2014-01-24 MED ORDER — BD LANCET DEVICE MISC: Status: DC

## 2014-01-24 MED ORDER — GLUCOSE BLOOD VI DISK: 1.0000 | DISK | Freq: Three times a day (TID) | Status: DC

## 2014-01-24 NOTE — Telephone Encounter (Signed)
LVM with male for patient to call back

## 2014-01-24 NOTE — Telephone Encounter (Signed)
Previously sent in but re-ordered today Please call and inform and remind pt to bring ALL MEDS to his appointment tomorrow

## 2014-01-25 ENCOUNTER — Ambulatory Visit: Payer: BC Managed Care – PPO | Admitting: Sports Medicine

## 2014-03-07 HISTORY — PX: CATARACT EXTRACTION W/ INTRAOCULAR LENS IMPLANT: SHX1309

## 2014-03-23 ENCOUNTER — Encounter: Payer: Self-pay | Admitting: Sports Medicine

## 2014-04-29 ENCOUNTER — Ambulatory Visit: Payer: BC Managed Care – PPO

## 2014-05-10 ENCOUNTER — Telehealth: Payer: Self-pay | Admitting: Family Medicine

## 2014-05-10 NOTE — Telephone Encounter (Signed)
Pt called and needs one of the nurse on the team to call him. He is out of several medications and has no insurance at this time. He would like to get refills on the medications he needs. Please call as soon as you can. jw

## 2014-05-10 NOTE — Telephone Encounter (Signed)
Patient was instructed to call previous pharmacy and have them fax refills to [facility].he voiced great appreciation.Samantha Ragen, [name]

## 2014-05-16 ENCOUNTER — Telehealth: Payer: Self-pay | Admitting: Family Medicine

## 2014-05-16 NOTE — Telephone Encounter (Signed)
Jeffrey [name] called reporting that he has felt extremely weak and dizziness all day. He check his BP and it was 87/40. Latter he reports his BP was 115/39 and he continues to feel like he is going to pass out. He denies any current CP or SOB but has Hx of MI last year. He also hx of Gi bleed and alcohol abuse. He reports recently switching pharmacy but denies any new medications.   Plan - I advised him not to take his BP medication which he take at night and go to the ED for evaluation.

## 2014-05-18 ENCOUNTER — Inpatient Hospital Stay (HOSPITAL_COMMUNITY)
Admission: EM | Admit: 2014-05-18 | Discharge: 2014-05-20 | DRG: 439 | Disposition: A | Discharge status: Home / Self Care | Payer: BC Managed Care – PPO | Attending: Family Medicine | Admitting: Family Medicine

## 2014-05-18 ENCOUNTER — Encounter (HOSPITAL_COMMUNITY): Payer: Self-pay | Admitting: Emergency Medicine

## 2014-05-18 DIAGNOSIS — E119 Type 2 diabetes mellitus without complications: Secondary | ICD-10-CM

## 2014-05-18 DIAGNOSIS — I10 Essential (primary) hypertension: Secondary | ICD-10-CM

## 2014-05-18 DIAGNOSIS — E872 Acidosis, unspecified: Secondary | ICD-10-CM

## 2014-05-18 DIAGNOSIS — F1010 Alcohol abuse, uncomplicated: Secondary | ICD-10-CM | POA: Diagnosis present

## 2014-05-18 DIAGNOSIS — Z6837 Body mass index (BMI) 37.0-37.9, adult: Secondary | ICD-10-CM

## 2014-05-18 DIAGNOSIS — E860 Dehydration: Secondary | ICD-10-CM

## 2014-05-18 DIAGNOSIS — E861 Hypovolemia: Secondary | ICD-10-CM | POA: Diagnosis present

## 2014-05-18 DIAGNOSIS — K852 Alcohol induced acute pancreatitis without necrosis or infection: Secondary | ICD-10-CM

## 2014-05-18 DIAGNOSIS — E1169 Type 2 diabetes mellitus with other specified complication: Secondary | ICD-10-CM | POA: Diagnosis present

## 2014-05-18 DIAGNOSIS — G4733 Obstructive sleep apnea (adult) (pediatric): Secondary | ICD-10-CM | POA: Diagnosis present

## 2014-05-18 DIAGNOSIS — R197 Diarrhea, unspecified: Secondary | ICD-10-CM

## 2014-05-18 DIAGNOSIS — N179 Acute kidney failure, unspecified: Secondary | ICD-10-CM | POA: Diagnosis present

## 2014-05-18 DIAGNOSIS — K922 Gastrointestinal hemorrhage, unspecified: Secondary | ICD-10-CM

## 2014-05-18 DIAGNOSIS — Z794 Long term (current) use of insulin: Secondary | ICD-10-CM

## 2014-05-18 DIAGNOSIS — Z7982 Long term (current) use of aspirin: Secondary | ICD-10-CM

## 2014-05-18 DIAGNOSIS — Z888 Allergy status to other drugs, medicaments and biological substances status: Secondary | ICD-10-CM

## 2014-05-18 DIAGNOSIS — N289 Disorder of kidney and ureter, unspecified: Secondary | ICD-10-CM

## 2014-05-18 DIAGNOSIS — E7800 Pure hypercholesterolemia, unspecified: Secondary | ICD-10-CM

## 2014-05-18 DIAGNOSIS — K859 Acute pancreatitis without necrosis or infection, unspecified: Principal | ICD-10-CM | POA: Diagnosis present

## 2014-05-18 HISTORY — DX: Type 2 diabetes mellitus without complications: E11.9

## 2014-05-18 HISTORY — DX: Gastro-esophageal reflux disease without esophagitis: K21.9

## 2014-05-18 HISTORY — DX: Chronic or unspecified peptic ulcer, site unspecified, with hemorrhage: K27.4

## 2014-05-18 HISTORY — DX: Obstructive sleep apnea (adult) (pediatric): G47.33

## 2014-05-18 HISTORY — DX: Dependence on other enabling machines and devices: Z99.89

## 2014-05-18 HISTORY — DX: Pure hypercholesterolemia, unspecified: E78.00

## 2014-05-18 LAB — URINE MICROSCOPIC-ADD ON

## 2014-05-18 LAB — COMPREHENSIVE METABOLIC PANEL
ALBUMIN: 3.8 g/dL (ref 3.5–5.2)
ALT: 71 U/L — AB (ref 0–53)
AST: 81 U/L — AB (ref 0–37)
Alkaline Phosphatase: 106 U/L (ref 39–117)
Anion gap: 31 — ABNORMAL HIGH (ref 5–15)
BILIRUBIN TOTAL: 0.6 mg/dL (ref 0.3–1.2)
BUN: 33 mg/dL — ABNORMAL HIGH (ref 6–23)
CHLORIDE: 84 meq/L — AB (ref 96–112)
CO2: 16 mEq/L — ABNORMAL LOW (ref 19–32)
Calcium: 9.5 mg/dL (ref 8.4–10.5)
Creatinine, Ser: 1.44 mg/dL — ABNORMAL HIGH (ref 0.50–1.35)
GFR calc Af Amer: 65 mL/min — ABNORMAL LOW (ref >90)
GFR calc non Af Amer: 56 mL/min — ABNORMAL LOW (ref >90)
Glucose, Bld: 197 mg/dL — ABNORMAL HIGH (ref 70–99)
Potassium: 4.6 mEq/L (ref 3.7–5.3)
SODIUM: 131 meq/L — AB (ref 137–147)
Total Protein: 8.3 g/dL (ref 6.0–8.3)

## 2014-05-18 LAB — CBC
HCT: 38.9 % — ABNORMAL LOW (ref 39.0–52.0)
Hemoglobin: 13.2 g/dL (ref 13.0–17.0)
MCH: 30.6 pg (ref 26.0–34.0)
MCHC: 33.9 g/dL (ref 30.0–36.0)
MCV: 90.3 fL (ref 78.0–100.0)
PLATELETS: 207 10*3/uL (ref 150–400)
RBC: 4.31 MIL/uL (ref 4.22–5.81)
RDW: 17.6 % — AB (ref 11.5–15.5)
WBC: 11.1 10*3/uL — AB (ref 4.0–10.5)

## 2014-05-18 LAB — CREATININE, SERUM
Creatinine, Ser: 1.13 mg/dL (ref 0.50–1.35)
GFR calc Af Amer: 87 mL/min — ABNORMAL LOW (ref >90)
GFR, EST NON AFRICAN AMERICAN: 75 mL/min — AB (ref >90)

## 2014-05-18 LAB — CBC WITH DIFFERENTIAL/PLATELET
BASOS ABS: 0 10*3/uL (ref 0.0–0.1)
BASOS PCT: 0 % (ref 0–1)
Eosinophils Absolute: 0.1 10*3/uL (ref 0.0–0.7)
Eosinophils Relative: 1 % (ref 0–5)
HEMATOCRIT: 38.8 % — AB (ref 39.0–52.0)
Hemoglobin: 13.3 g/dL (ref 13.0–17.0)
LYMPHS PCT: 16 % (ref 12–46)
Lymphs Abs: 1.5 10*3/uL (ref 0.7–4.0)
MCH: 30.9 pg (ref 26.0–34.0)
MCHC: 34.3 g/dL (ref 30.0–36.0)
MCV: 90 fL (ref 78.0–100.0)
MONO ABS: 0.9 10*3/uL (ref 0.1–1.0)
Monocytes Relative: 9 % (ref 3–12)
NEUTROS ABS: 7.3 10*3/uL (ref 1.7–7.7)
NEUTROS PCT: 74 % (ref 43–77)
PLATELETS: 217 10*3/uL (ref 150–400)
RBC: 4.31 MIL/uL (ref 4.22–5.81)
RDW: 17.5 % — AB (ref 11.5–15.5)
WBC: 9.8 10*3/uL (ref 4.0–10.5)

## 2014-05-18 LAB — RAPID URINE DRUG SCREEN, HOSP PERFORMED
Amphetamines: NOT DETECTED
BARBITURATES: NOT DETECTED
Benzodiazepines: NOT DETECTED
Cocaine: NOT DETECTED
Opiates: POSITIVE — AB
Tetrahydrocannabinol: NOT DETECTED

## 2014-05-18 LAB — LIPASE, BLOOD: LIPASE: 157 U/L — AB (ref 11–59)

## 2014-05-18 LAB — I-STAT TROPONIN, ED: Troponin i, poc: 0 ng/mL (ref 0.00–0.08)

## 2014-05-18 LAB — URINALYSIS, ROUTINE W REFLEX MICROSCOPIC
Ketones, ur: 40 mg/dL — AB
LEUKOCYTES UA: NEGATIVE
Nitrite: NEGATIVE
SPECIFIC GRAVITY, URINE: 1.026 (ref 1.005–1.030)
Urobilinogen, UA: 0.2 mg/dL (ref 0.0–1.0)
pH: 5 (ref 5.0–8.0)

## 2014-05-18 LAB — CBG MONITORING, ED: Glucose-Capillary: 192 mg/dL — ABNORMAL HIGH (ref 70–99)

## 2014-05-18 LAB — GLUCOSE, CAPILLARY
GLUCOSE-CAPILLARY: 153 mg/dL — AB (ref 70–99)
GLUCOSE-CAPILLARY: 106 mg/dL — AB (ref 70–99)

## 2014-05-18 LAB — ETHANOL: Alcohol, Ethyl (B): 115 mg/dL — ABNORMAL HIGH (ref 0–11)

## 2014-05-18 MED ORDER — SODIUM CHLORIDE 0.9 % IV SOLN
INTRAVENOUS | Status: DC
  Administered 2014-05-18 – 2014-05-19 (×3): via INTRAVENOUS

## 2014-05-18 MED ORDER — ACETAMINOPHEN 650 MG RE SUPP: 650.0000 mg | Freq: Four times a day (QID) | RECTAL | Status: DC | PRN

## 2014-05-18 MED ORDER — SODIUM CHLORIDE 0.9 % IV BOLUS (SEPSIS)
1000.0000 mL | Freq: Once | INTRAVENOUS | Status: AC
  Administered 2014-05-18: 1000 mL via INTRAVENOUS

## 2014-05-18 MED ORDER — LORAZEPAM 2 MG/ML IJ SOLN: 1.0000 mg | Freq: Four times a day (QID) | INTRAMUSCULAR | Status: DC | PRN

## 2014-05-18 MED ORDER — PANTOPRAZOLE SODIUM 40 MG PO TBEC
40.0000 mg | DELAYED_RELEASE_TABLET | Freq: Every day | ORAL | Status: DC
  Administered 2014-05-18: 40 mg via ORAL
  Filled 2014-05-18: qty 1

## 2014-05-18 MED ORDER — INSULIN ASPART 100 UNIT/ML ~~LOC~~ SOLN
0.0000 [IU] | SUBCUTANEOUS | Status: DC
  Administered 2014-05-19: 2 [IU] via SUBCUTANEOUS

## 2014-05-18 MED ORDER — INSULIN GLARGINE 100 UNIT/ML ~~LOC~~ SOLN
30.0000 [IU] | Freq: Two times a day (BID) | SUBCUTANEOUS | Status: DC
  Administered 2014-05-19 (×2): 30 [IU] via SUBCUTANEOUS
  Filled 2014-05-18 (×5): qty 0.3

## 2014-05-18 MED ORDER — PANTOPRAZOLE SODIUM 40 MG PO TBEC
40.0000 mg | DELAYED_RELEASE_TABLET | Freq: Two times a day (BID) | ORAL | Status: DC
  Administered 2014-05-19 – 2014-05-20 (×3): 40 mg via ORAL
  Filled 2014-05-18 (×3): qty 1

## 2014-05-18 MED ORDER — FOLIC ACID 1 MG PO TABS
1.0000 mg | ORAL_TABLET | Freq: Every day | ORAL | Status: DC
  Administered 2014-05-18 – 2014-05-20 (×3): 1 mg via ORAL
  Filled 2014-05-18 (×3): qty 1

## 2014-05-18 MED ORDER — ASPIRIN EC 81 MG PO TBEC
81.0000 mg | DELAYED_RELEASE_TABLET | Freq: Every day | ORAL | Status: DC
  Administered 2014-05-18 – 2014-05-20 (×3): 81 mg via ORAL
  Filled 2014-05-18 (×3): qty 1

## 2014-05-18 MED ORDER — HYDROCODONE-ACETAMINOPHEN 5-325 MG PO TABS
1.0000 | ORAL_TABLET | ORAL | Status: DC | PRN
  Administered 2014-05-18 – 2014-05-20 (×6): 1 via ORAL
  Filled 2014-05-18 (×6): qty 1

## 2014-05-18 MED ORDER — ADULT MULTIVITAMIN W/MINERALS CH
1.0000 | ORAL_TABLET | Freq: Every day | ORAL | Status: DC
  Administered 2014-05-19 – 2014-05-20 (×2): 1 via ORAL
  Filled 2014-05-18 (×2): qty 1

## 2014-05-18 MED ORDER — ENOXAPARIN SODIUM 40 MG/0.4ML ~~LOC~~ SOLN
40.0000 mg | SUBCUTANEOUS | Status: DC
  Administered 2014-05-18 – 2014-05-19 (×2): 40 mg via SUBCUTANEOUS
  Filled 2014-05-18 (×3): qty 0.4

## 2014-05-18 MED ORDER — LORAZEPAM 1 MG PO TABS
1.0000 mg | ORAL_TABLET | Freq: Four times a day (QID) | ORAL | Status: DC | PRN
  Administered 2014-05-20: 1 mg via ORAL
  Filled 2014-05-18: qty 1

## 2014-05-18 MED ORDER — SIMVASTATIN 40 MG PO TABS
40.0000 mg | ORAL_TABLET | Freq: Every day | ORAL | Status: DC
  Administered 2014-05-18 – 2014-05-19 (×2): 40 mg via ORAL
  Filled 2014-05-18 (×3): qty 1

## 2014-05-18 MED ORDER — THIAMINE HCL 100 MG/ML IJ SOLN
100.0000 mg | Freq: Every day | INTRAMUSCULAR | Status: DC
Start: 2014-05-18 — End: 2014-05-20
  Administered 2014-05-18 – 2014-05-19 (×2): 100 mg via INTRAVENOUS
  Filled 2014-05-18 (×3): qty 1

## 2014-05-18 MED ORDER — MORPHINE SULFATE 4 MG/ML IJ SOLN
4.0000 mg | Freq: Once | INTRAMUSCULAR | Status: AC
  Administered 2014-05-18: 4 mg via INTRAVENOUS
  Filled 2014-05-18: qty 1

## 2014-05-18 MED ORDER — ACETAMINOPHEN 325 MG PO TABS: 650.0000 mg | ORAL_TABLET | Freq: Four times a day (QID) | ORAL | Status: DC | PRN

## 2014-05-18 NOTE — ED Notes (Signed)
Pt attempting to provide urine sample

## 2014-05-18 NOTE — ED Notes (Signed)
Pt aware of the need for a urine sample, urinal at bedside. 

## 2014-05-18 NOTE — ED Notes (Signed)
[facility] at bedside transporting patient now.

## 2014-05-18 NOTE — Telephone Encounter (Signed)
Please contact patient regarding a statement he needs to apply for his disability.  He need to have it state all of his medical conditions as well as the fact of having numerous hospital admissions.

## 2014-05-18 NOTE — ED Notes (Signed)
[facility] called. 

## 2014-05-18 NOTE — ED Notes (Signed)
Pt reminded of need for urine specimen 

## 2014-05-18 NOTE — H&P (Signed)
[facility] Admission History and Physical Service Pager: [phone]  Patient name: Jeffrey [name] Medical record number: [id number] Date of birth: June 02, 1966 Age: 48 y.o. Gender: male  Primary Care Provider: Howard Pouch, DO Consultants: None Code Status: Full  Chief Complaint: Abdominal pain, weakness  Assessment and Plan: Jeffrey [name] is a 48 y.o. male presenting with abdominal pain, diarrhea and weakness . PMH is significant for alcohol abuse.  Dehydration and vomiting with AKI: Consistent with viral gastroenteritis and poor po intake worsened by EtOH intake.Lipase mildly elevated (157) with epigastric tenderness - still doubt this is due to pancreatitis. Could get CT abdomen if not improving with supportive measures, but will make NPO and give fluids regardless.  - IVF required as he cannot take PO - Defer CT abdomen at this time given benign abdominal exam  Insulin-dependent T2DM: With a history of DKA documented in EMR. - Continue lantus at home dose and moderate SSI with q4h CBGs while not eating. - Continue ASA, statin - Care management consult for medication needs.   Anion gap metabolic acidemia: Bicarb 16 with anion gap of 31 due to ketosis of fasting state, lactate, and EtOH. Repiratory status stable. Anticipate correction following IVF administration and improvement in po intake.  - Continue to monitor  Alcohol abuse: EtOH + in ED  - CIWA - Thiamine, folate - PPI - Social work consult  FEN/GI: NPO, D5NS @ 150cc/hr Prophylaxis: PPI, lovenox  Disposition: Admit to med-surg FMTS, Dr. Andria Frames attending  History of Present Illness: Jeffrey [name] is a 48 y.o. male presenting with abdominal pain, diarrhea and weakness for the past 4 days.  Mr. Naeem endorses worsening nausea vomiting and diarrhea for the past 4 days which has kept him from being able to keep anything down. He does endorse drinking alcohol successfully during this  time. He has not taken his medications, except lantus this morning, and does not know his blood sugars. He has been confined to bed due to weakness and is making less urine than usual. He reports a history of admission for high blood sugar but cannot remember when. Denies choking on emesis. No sick contacts, recent travel, suspicious ingestions.  He denies HA, vision and hearing changes, CP, SOB, palpitations, myalgias, arthralgias, rash. No hematochezia or hematemesis.   Review Of Systems: Per HPI Otherwise 12 point review of systems was performed and was unremarkable.  Patient Active Problem List   Diagnosis Date Noted  . Dehydration 05/18/2014  . DKA (diabetic ketoacidoses) 01/04/2014  . OSA on CPAP 12/28/2012  . Acute renal insufficiency 12/08/2012  . Pulmonary edema 12/08/2012  . Hypoglycemia 12/07/2012  . Cardiac arrest 12/07/2012  . Diarrhea 12/07/2012  . Obesity 12/07/2012  . Anoxic brain injury 12/07/2012  . Acute respiratory failure 12/06/2012  . GI bleed 11/26/2012  . Venous insufficiency 12/13/2011  . Leg wound, left 12/03/2011  . INSOMNIA, CHRONIC 02/14/2009  . 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 ALCOHOL ABUSE, HX OF 11/10/2007  . ERECTILE DYSFUNCTION 04/14/2007  . DM type 2 goal A1C below 7.5 12/04/2006  . HYPERCHOLESTEROLEMIA 12/04/2006  . OBESITY, NOS 12/04/2006  . ANXIETY 12/04/2006  . HYPERTENSION, BENIGN SYSTEMIC 12/04/2006  . ARTHRITIS 12/04/2006   Past Medical History: Past Medical History  Diagnosis Date  . Hypertension   . Blood transfusion   . Diabetes mellitus   . Venous insufficiency 12/13/2011  . Cardiac arrest 12/07/2012  . OSA (obstructive sleep apnea) 12/07/2012  . Anxiety   .  Acute renal insufficiency 12/08/2012   Past Surgical History: Past Surgical History  Procedure Laterality Date  . Knee arthroscopy    . Esophagogastroduodenoscopy Left 11/26/2012    Procedure: ESOPHAGOGASTRODUODENOSCOPY (EGD);  Surgeon: Wonda Horner, MD;  Location: [facility] ENDOSCOPY;  Service: Endoscopy;  Laterality: Left;  . Cardiovascular stress test  03/16/13    Very Poor Exercise Tolerance; NON DIAGNOSTIC TEST  . Cataract extraction w/ intraocular lens implant Left 03/07/2014    [facility] @ [facility]   Social History: History  Substance Use Topics  . Smoking status: Never Smoker   . Smokeless tobacco: Not on file  . Alcohol Use: 8.4 oz/week    14 Cans of beer per week     Comment: couple cans day--few beers daily    Please also refer to relevant sections of EMR.  Family History: No family history on file. Allergies and Medications: Allergies  Allergen Reactions  . Lisinopril Other (See Comments)    Pt reports nose bleed.   No current facility-administered medications on file prior to encounter.   Current Outpatient Prescriptions on File Prior to Encounter  Medication Sig Dispense Refill  . losartan (COZAAR) 100 MG tablet Take 1 tablet (100 mg total) by mouth daily.  30 tablet  0  . pantoprazole (PROTONIX) 40 MG tablet Take 1 tablet (40 mg total) by mouth daily.  90 tablet  3    Objective: BP 144/89  Pulse 110  Temp(Src) 98.2 F (36.8 C)  Resp 24  Ht 5\' 10"  (1.778 m)  Wt 261 lb (118.389 kg)  BMI 37.45 kg/m2  SpO2 99% Exam: General: Obese 48 y.o. male laying in bed talking with family in NAD HEENT: Tacky MM, oropharynx clear.  Cardiovascular: RRR, no murmur or JVD Respiratory: Non-labored on room air, Diminished breath sounds at bases without crackles or wheezes Abdomen: Obese, +BS, soft, mild TTP epigastrium without distention, rebound or guarding. LLQ and all other areas otherwise nontender. Extremities: No deformities or edema. Good cap refill Skin: No jaundice, wounds or rashes Neuro: Alert and oriented with normal speech. Appears somnolent. Gait not assessed.   Labs and Imaging: CBC BMET   Recent Labs Lab 05/18/14 1346  WBC 9.8  HGB 13.3  HCT 38.8*  PLT 217    Recent Labs Lab  05/18/14 1346  NA 131*  K 4.6  CL 84*  CO2 16*  BUN 33*  CREATININE 1.44*  GLUCOSE 197*  CALCIUM 9.5     Ethanol 115 UDS: Opiates only  Patrecia Pour, MD 05/18/2014, 5:15 PM PGY-2, [place] Intern pager: [phone], text pages welcome

## 2014-05-18 NOTE — ED Provider Notes (Signed)
CSN: [id number]     Arrival date & time 05/18/14  1315 History   First MD Initiated Contact with Patient 05/18/14 1413     Chief Complaint  Patient presents with  . Abdominal Pain  . Weakness      Patient is a 48 y.o. male presenting with abdominal pain and weakness. The history is provided by the patient.  Abdominal Pain Pain location:  Generalized Pain quality: aching   Pain severity:  Moderate Onset quality:  Gradual Duration:  4 days Timing:  Constant Progression:  Worsening Chronicity:  New Relieved by:  Nothing Worsened by:  Nothing tried Associated symptoms: cough, diarrhea, fatigue, hematochezia and nausea   Associated symptoms: no fever and no vomiting   Weakness Associated symptoms include abdominal pain.  Pt presents for multiple complaints He reports generalized abdominal pain and fatigue for up to 4 days He reports nausea He reports diarrhea with some red blood mixed in the stool He also admits to ETOH use He also reports poor control of glucose He admits to not taking his HTN meds due to recent drops in his BP at home   Past Medical History  Diagnosis Date  . Hypertension   . Blood transfusion   . Diabetes mellitus   . Venous insufficiency 12/13/2011  . Cardiac arrest 12/07/2012  . OSA (obstructive sleep apnea) 12/07/2012  . Anxiety   . Acute renal insufficiency 12/08/2012   Past Surgical History  Procedure Laterality Date  . Knee arthroscopy    . Esophagogastroduodenoscopy Left 11/26/2012    Procedure: ESOPHAGOGASTRODUODENOSCOPY (EGD);  Surgeon: Wonda Horner, MD;  Location: [facility] ENDOSCOPY;  Service: Endoscopy;  Laterality: Left;  . Cardiovascular stress test  03/16/13    Very Poor Exercise Tolerance; NON DIAGNOSTIC TEST  . Cataract extraction w/ intraocular lens implant Left 03/07/2014    [facility] @ [facility]   No family history on file. History  Substance Use Topics  . Smoking status: Never Smoker   . Smokeless tobacco: Not on file  . Alcohol  Use: 8.4 oz/week    14 Cans of beer per week     Comment: couple cans day--few beers daily    Review of Systems  Constitutional: Positive for appetite change and fatigue. Negative for fever.  Respiratory: Positive for cough.   Gastrointestinal: Positive for nausea, abdominal pain, diarrhea and hematochezia. Negative for vomiting.  Neurological: Positive for weakness.  All other systems reviewed and are negative.     Allergies  Lisinopril  Home Medications   Prior to Admission medications   Medication Sig Start Date End Date Taking? Authorizing Provider  amLODipine (NORVASC) 10 MG tablet Take 1 tablet (10 mg total) by mouth daily. 01/10/14   Olam Idler, MD  aspirin (ADULT ASPIRIN EC LOW STRENGTH) 81 MG EC tablet Take 1 tablet (81 mg total) by mouth daily. 08/21/11   Melony Overly, MD  Insulin Glargine (LANTUS SOLOSTAR) 100 UNIT/ML Solostar Pen Inject 35 Units into the skin 2 (two) times daily. 01/10/14   Olam Idler, MD  insulin lispro (HUMALOG) 100 UNIT/ML cartridge 5 units + 1 Unit for each 50 above 200  >400=5units and call doctor 01/10/14   Olam Idler, MD  losartan (COZAAR) 100 MG tablet Take 1 tablet (100 mg total) by mouth daily. 01/10/14   Olam Idler, MD  pantoprazole (PROTONIX) 40 MG tablet Take 1 tablet (40 mg total) by mouth daily. 08/23/13   Gerda Diss, DO  pravastatin (PRAVACHOL)  80 MG tablet Take 1 tablet (80 mg total) by mouth at bedtime. for Cholesterol 08/23/13   Gerda Diss, DO  traZODone (DESYREL) 100 MG tablet Take 1 tablet (100 mg total) by mouth at bedtime. sleep 08/23/13   Gerda Diss, DO   BP 133/87  Pulse 108  Temp(Src) 98.2 F (36.8 C)  Resp 27  Ht 5\' 10"  (1.778 m)  Wt 261 lb (118.389 kg)  BMI 37.45 kg/m2  SpO2 99% Physical Exam CONSTITUTIONAL: disheveled.  Pt smells ketotic HEAD: Normocephalic/atraumatic EYES: EOMI/PERRL ENMT: Mucous membranes dry NECK: supple no meningeal signs SPINE:entire spine nontender CV: S1/S2 noted,  no murmurs/rubs/gallops noted LUNGS: Lungs are clear to auscultation bilaterally, no apparent distress ABDOMEN: soft, nontender, no rebound or guarding Rectal - stool color brown.  No melena or blood.  Hemoccult negative GU:no cva tenderness NEURO: Pt is awake/alert, moves all extremitiesx4 EXTREMITIES: pulses normal, full ROM SKIN: warm, color normal PSYCH: no abnormalities of mood noted  ED Course  Procedures   3:19 PM Pt with abd pain/diarrhea for past several days He is dehydrated He is acidotic, but glucose <200 He will need IV hydration and inpatient management D/w FP resident Dr Bonner Puna, will admit to [facility] Pt agreeable with plan Pt with only minimal abd tenderness, defer CT imaging for now  Labs Review Labs Reviewed  CBC WITH DIFFERENTIAL - Abnormal; Notable for the following:    HCT 38.8 (*)    RDW 17.5 (*)    All other components within normal limits  COMPREHENSIVE METABOLIC PANEL - Abnormal; Notable for the following:    Glucose, Bld 197 (*)    BUN 33 (*)    Creatinine, Ser 1.44 (*)    AST 81 (*)    ALT 71 (*)    GFR calc non Af Amer 56 (*)    GFR calc Af Amer 65 (*)    All other components within normal limits  LIPASE, BLOOD - Abnormal; Notable for the following:    Lipase 157 (*)    All other components within normal limits  CBG MONITORING, ED - Abnormal; Notable for the following:    Glucose-Capillary 192 (*)    All other components within normal limits  URINALYSIS, ROUTINE W REFLEX MICROSCOPIC  BLOOD GAS, VENOUS  I-STAT TROPOININ, ED  POC OCCULT BLOOD, ED      EKG Interpretation   Date/Time:  Wednesday May 18 2014 13:38:54 EDT Ventricular Rate:  98 PR Interval:  171 QRS Duration: 102 QT Interval:  365 QTC Calculation: 466 R Axis:   111 Text Interpretation:  Sinus rhythm Consider left atrial enlargement Right  axis deviation Baseline wander in lead(s) V2 Confirmed by Christy Gentles  MD,  Elenore Rota ([id number]) on 05/18/2014 2:14:56 PM      MDM    Final diagnoses:  Dehydration  Metabolic acidosis    Nursing notes including past medical history and social history reviewed and considered in documentation Labs/vital reviewed and considered Previous records reviewed and considered     Sharyon Cable, MD 05/18/14 1523

## 2014-05-18 NOTE — ED Notes (Signed)
POC occult  resulted negative.

## 2014-05-18 NOTE — ED Notes (Addendum)
Pt from home via [facility]  C/o abdominal pain and weakness since Sunday. He reports having blood in stool since Sunday. He has had a loss in appetite. He drink "2 40 oz every other day". Hx of pancreatitis. Denies nausea and vomiting. 20g left hand 500 mL NS enroute. Pt reluctcant to answer questions.

## 2014-05-18 NOTE — ED Notes (Addendum)
[facility] on their way. Report given to [facility]

## 2014-05-18 NOTE — Progress Notes (Signed)
New Admission Note:   Arrival Method: via [facility] Mental Orientation: A & O x4 Telemetry: n/a Assessment: Completed Skin: No break down IV:  NS @ 150 Pain: IN ABDOMEN Tubes: NONE Safety Measures: Safety Fall Prevention Plan has been given, discussed and signed Admission: Completed [address] Orientation: Patient has been orientated to the room, unit and staff.  Family: AT BEDSIDE  Orders have been reviewed and implemented. Will continue to monitor the patient. Call light has been placed within reach and bed alarm has been activated.   Dudley Major BSN, Consulting civil engineer number: [phone]

## 2014-05-19 ENCOUNTER — Telehealth: Payer: Self-pay | Admitting: Family Medicine

## 2014-05-19 LAB — CBC
HCT: 36.8 % — ABNORMAL LOW (ref 39.0–52.0)
Hemoglobin: 12 g/dL — ABNORMAL LOW (ref 13.0–17.0)
MCH: 29.6 pg (ref 26.0–34.0)
MCHC: 32.6 g/dL (ref 30.0–36.0)
MCV: 90.9 fL (ref 78.0–100.0)
PLATELETS: 182 10*3/uL (ref 150–400)
RBC: 4.05 MIL/uL — ABNORMAL LOW (ref 4.22–5.81)
RDW: 17.8 % — AB (ref 11.5–15.5)
WBC: 9 10*3/uL (ref 4.0–10.5)

## 2014-05-19 LAB — COMPREHENSIVE METABOLIC PANEL
ALT: 58 U/L — ABNORMAL HIGH (ref 0–53)
AST: 64 U/L — ABNORMAL HIGH (ref 0–37)
Albumin: 3.3 g/dL — ABNORMAL LOW (ref 3.5–5.2)
Alkaline Phosphatase: 91 U/L (ref 39–117)
Anion gap: 21 — ABNORMAL HIGH (ref 5–15)
BILIRUBIN TOTAL: 0.8 mg/dL (ref 0.3–1.2)
BUN: 25 mg/dL — AB (ref 6–23)
CHLORIDE: 94 meq/L — AB (ref 96–112)
CO2: 21 meq/L (ref 19–32)
CREATININE: 1.14 mg/dL (ref 0.50–1.35)
Calcium: 8.3 mg/dL — ABNORMAL LOW (ref 8.4–10.5)
GFR, EST AFRICAN AMERICAN: 86 mL/min — AB (ref >90)
GFR, EST NON AFRICAN AMERICAN: 74 mL/min — AB (ref >90)
Glucose, Bld: 88 mg/dL (ref 70–99)
Potassium: 4.8 mEq/L (ref 3.7–5.3)
Sodium: 136 mEq/L — ABNORMAL LOW (ref 137–147)
Total Protein: 7.4 g/dL (ref 6.0–8.3)

## 2014-05-19 LAB — GLUCOSE, CAPILLARY
GLUCOSE-CAPILLARY: 75 mg/dL (ref 70–99)
GLUCOSE-CAPILLARY: 132 mg/dL — AB (ref 70–99)
Glucose-Capillary: 113 mg/dL — ABNORMAL HIGH (ref 70–99)
Glucose-Capillary: 82 mg/dL (ref 70–99)
Glucose-Capillary: 87 mg/dL (ref 70–99)
Glucose-Capillary: 99 mg/dL (ref 70–99)

## 2014-05-19 LAB — HEMOGLOBIN A1C
HEMOGLOBIN A1C: 9 % — AB (ref <5.7)
Mean Plasma Glucose: 212 mg/dL — ABNORMAL HIGH (ref <117)

## 2014-05-19 LAB — AMYLASE: Amylase: 70 U/L (ref 0–105)

## 2014-05-19 LAB — PROTIME-INR
INR: 1.06 (ref 0.00–1.49)
Prothrombin Time: 13.8 seconds (ref 11.6–15.2)

## 2014-05-19 LAB — OCCULT BLOOD, POC DEVICE: FECAL OCCULT BLD: NEGATIVE

## 2014-05-19 MED ORDER — DEXTROSE-NACL 5-0.9 % IV SOLN
INTRAVENOUS | Status: DC
  Administered 2014-05-19: 125 mL/h via INTRAVENOUS

## 2014-05-19 MED ORDER — METOPROLOL SUCCINATE ER 100 MG PO TB24
100.0000 mg | ORAL_TABLET | Freq: Every day | ORAL | Status: DC
  Administered 2014-05-19 – 2014-05-20 (×2): 100 mg via ORAL
  Filled 2014-05-19 (×2): qty 1

## 2014-05-19 MED ORDER — HYDRALAZINE HCL 20 MG/ML IJ SOLN: 5.0000 mg | INTRAMUSCULAR | Status: DC | PRN

## 2014-05-19 MED ORDER — SODIUM CHLORIDE 0.9 % IV SOLN
INTRAVENOUS | Status: DC
  Administered 2014-05-19: 150 mL/h via INTRAVENOUS
  Administered 2014-05-20: 01:00:00 via INTRAVENOUS

## 2014-05-19 NOTE — Progress Notes (Signed)
UR Completed Rein Popov Graves-Bigelow, RN,BSN [phone]  

## 2014-05-19 NOTE — Progress Notes (Signed)
Chart/note reviewed.  Arthur Holms, RD, LDN Pager #: [phone] After-Hours Pager #: [phone]

## 2014-05-19 NOTE — Telephone Encounter (Signed)
Please call pt and inform him that he will apply for disability through the state of [place] and they will request our records be sent to them. Thanks.

## 2014-05-19 NOTE — Progress Notes (Signed)
Patient ID: HALEEM SEMELSBERGER, male   DOB: [birth year], 48 y.o.   MRN: [id number] Family Medicine Teaching Service Daily Progress Note Intern Pager: [phone]  Patient name: Jeffrey [name] Medical record number: [id number] Date of birth: 09-01-1966 Age: 48 y.o. Gender: male  Primary Care Provider: Howard Pouch, DO Consultants: None Code Status: Full  Pt Overview and Major Events to Date:  8/12- Admitted for abdominal pain and dehydration 8/13- Bowel rest and fluids  Assessment and Plan: PAYMON BERKOVITZ is a 48 y.o. male presenting with abdominal pain, diarrhea and weakness . PMH is significant for alcohol abuse, DM2, HTN, and hypercholesterolemia.   Abdominal Pain - possible alcohol induced pancreatitis-Lipase mildly elevated (157) with epigastric tenderness -Lipase was elevated; amylase ordered and pending -Continue bowel rest and fluids -Tylenol and Norco PRN for pain -Defer CT abdomen at this time -Advance diet when pain and nausea allow; start with clears   Dehydration and vomiting with AKI: Consistent with prerenal azotemia. Poor po intake worsened by EtOH intake.  Could get CT abdomen if not improving with supportive measures, but will make NPO and give fluids.  -BUN/Cr improving with IV hydration -IVF required as he cannot take PO at this time  Insulin-dependent T2DM: With a history of DKA documented in EMR.  - Continue lantus at home dose and moderate SSI with q4h CBGs while not eating.  - Continue ASA, statin  - Care management consult for medication needs.   Anion gap metabolic acidemia: Bicarb 16 with anion gap of 31 due to ketosis of fasting state, lactate, and EtOH. Repiratory status stable. Anticipate correction following IVF administration and improvement in po intake. Improving on recent labs - Continue to monitor  -repeat labs daily until corrected  Alcohol abuse: EtOH + in ED  - CIWA  - Thiamine, folate  - PPI BID -Monitor for DTs -Social work consult    HTN - BPs were stable when admitted but increasing (164/94) so will restart some of his home meds -Restarted metoprolol from his home regimen -Will continue to hold Losartan  FEN/GI: NPO, NS @ 150cc/hr  Prophylaxis: PPI, lovenox  Disposition: Continue current management plan as above.   Subjective:  Patient is voicing complaints about abdominal pain this morning. He says that he hasn't gotten much better since coming in. He does state that he has had this pain previously with pancreatitis.   Objective: Temp:  [98 F (36.7 C)-98.4 F (36.9 C)] 98.2 F (36.8 C) (08/13 0525) Pulse Rate:  [98-110] 98 (08/13 0525) Resp:  [15-27] 18 (08/13 0525) BP: (130-164)/(77-94) 160/94 mmHg (08/13 0525) SpO2:  [94 %-99 %] 97 % (08/13 0525) Weight:  [255 lb 4.7 oz (115.8 kg)-261 lb (118.389 kg)] 257 lb 8 oz (116.8 kg) (08/12 2123) Physical Exam: General: Obese 48 y.o. male laying in bed, NAD HEENT: EOMI, NCAT. No scleral icterus. Cardiovascular: RRR, no murmur or JVD  Respiratory: CTAB, no wheezes or crackles. Normal respiratory effort. Abdomen: Obese, +BS, soft, mild TTP epigastrium without distention or rebound. Some voluntary guarding. LLQ and all other areas otherwise nontender.  Extremities: No deformities or edema. Good cap refill  Skin: No jaundice, wounds or rashes  Neuro: A&O x3. No focal neurologic deficits.   Laboratory:  Recent Labs Lab 05/18/14 1346 05/18/14 1915 05/19/14 0624  WBC 9.8 11.1* 9.0  HGB 13.3 13.2 12.0*  HCT 38.8* 38.9* 36.8*  PLT 217 207 182    Recent Labs Lab 05/18/14 1346 05/18/14 1915 05/19/14 0624  NA 131*  --  136*  K 4.6  --  4.8  CL 84*  --  94*  CO2 16*  --  21  BUN 33*  --  25*  CREATININE 1.44* 1.13 1.14  CALCIUM 9.5  --  8.3*  PROT 8.3  --  7.4  BILITOT 0.6  --  0.8  ALKPHOS 106  --  91  ALT 71*  --  58*  AST 81*  --  64*  GLUCOSE 197*  --  88   Lipase 157  i-stat troponin neg Neg FOCB UDS +opiates A1C 9.0   Katheren Shams,  DO 05/19/2014, 9:29 AM PGY-1, [place] Intern pager: [phone], text pages welcome

## 2014-05-19 NOTE — Progress Notes (Signed)
Seen and examined.  Agree with the documentation and management of Dr. Gerarda Fraction.  I am comfortable with the diagnosis of acute, alcohol induced pancreatitis.  Pain control OK.  Hydration seems right.  Will advance diet as tolerated.

## 2014-05-19 NOTE — Care Management Note (Signed)
CARE MANAGEMENT NOTE 05/19/2014  Patient:  Jeffrey [name], Jeffrey [name]   Account Number:  [id number]  Date Initiated:  05/19/2014  Documentation initiated by:  Jasmine Pang  Subjective/Objective Assessment:   Noted referral due to medication cost.     Action/Plan:   05/19/2014 Also noted that pt has commercial insurance with medication benefits. Will advise pt to research Needymeds.com for manufacture discounts related to family income. Will provide applications if pt is unable to access this website.   Anticipated DC Date:  05/22/2014   Anticipated DC Plan:  HOME/SELF CARE         Choice offered to / List presented to:             Status of service:   Medicare Important Message given?   (If response is "NO", the following Medicare IM given date fields will be blank) Date Medicare IM given:   Medicare IM given by:   Date Additional Medicare IM given:   Additional Medicare IM given by:    Discharge Disposition:    Per UR Regulation:    If discussed at Long Length of Stay Meetings, dates discussed:    Comments:  05/19/2014 Will continue to monitor for d/c needs, noted that pt is + for alcohol intake in the ED. Perhaps monies used for alcohol could be redirected for medications. Jasmine Pang RN MPH, case manager, [phone]

## 2014-05-19 NOTE — Progress Notes (Signed)
INITIAL NUTRITION ASSESSMENT  DOCUMENTATION CODES Per approved criteria  -Obesity Unspecified   INTERVENTION: Once diet advances, recommend providing Glucerna Shake po BID, each supplement provides 220 kcal and 10 grams of protein.  NUTRITION DIAGNOSIS: Inadequate oral inake related to inability to eat as evidenced by NPO status.   Goal: Pt to meet >/= 90% of their estimated nutrition needs.  Monitor:  Diet advancement, weight trends, labs, I/O's  Reason for Assessment: MST  48 y.o. male  Admitting Dx: Dehydration  ASSESSMENT: Pt presenting with abdominal pain, diarrhea and weakness. Pt had worsening nausea vomiting and diarrhea for the past 4 days which has kept him from being able to keep anything down. PMH is significant for type 2 diabetes, HTN, and alcohol abuse. Pt on CIWA protocol.  Pt reports having no appetite. Pt has severe stomach pains and nausea. Pt reports he has not eaten little to nothing since Sunday as he can not keep it down. Pt reports his pain started Saturday. Prior to Saturday, pt reports he had a great appetite and ate great. Pt reports he has no recent weight loss. Pt is open to oral supplements, such as glucerna, once diet is advanced. Pt reports he drinks glucerna at home from time to time.   Nutrition focused physical exam was not performed due to pt being in severe pain. However, there were no observed significant signs of fat and muscle mass loss.  Labs: Low sodium, chloride, calcium, and GFR. High BUN, AST, and ALT.  Height: Ht Readings from Last 1 Encounters:  05/18/14 5\' 10"  (1.778 m)    Weight: Wt Readings from Last 1 Encounters:  05/18/14 257 lb 8 oz (116.8 kg)    Ideal Body Weight: 166 lbs  % Ideal Body Weight: 155%  Wt Readings from Last 10 Encounters:  05/18/14 257 lb 8 oz (116.8 kg)  01/14/14 264 lb 11.2 oz (120.067 kg)  01/07/14 258 lb 2.5 oz (117.1 kg)  12/29/13 268 lb 1.6 oz (121.609 kg)  08/06/13 280 lb 6.4 oz (127.189  kg)  06/04/13 207 lb 4.8 oz (94.031 kg)  05/31/13 268 lb 15.4 oz (122 kg)  05/26/13 274 lb (124.286 kg)  04/07/13 273 lb (123.832 kg)  03/11/13 272 lb (123.378 kg)    Usual Body Weight: 260 lbs  % Usual Body Weight: 99%  BMI:  Body mass index is 36.95 kg/(m^2). Class II obesity  Estimated Nutritional Needs: Kcal: 2000-2200 Protein: 90-105 grams Fluid: 2 L- 2.2 L/day  Skin: no issues noted  Diet Order: NPO  EDUCATION NEEDS: -No education needs identified at this time   Intake/Output Summary (Last 24 hours) at 05/19/14 0917 Last data filed at 05/19/14 0800  Gross per 24 hour  Intake 2217.5 ml  Output    300 ml  Net 1917.5 ml    Last BM: 8/11   Labs:   Recent Labs Lab 05/18/14 1346 05/18/14 1915 05/19/14 0624  NA 131*  --  136*  K 4.6  --  4.8  CL 84*  --  94*  CO2 16*  --  21  BUN 33*  --  25*  CREATININE 1.44* 1.13 1.14  CALCIUM 9.5  --  8.3*  GLUCOSE 197*  --  88    CBG (last 3)   Recent Labs  05/19/14 0002 05/19/14 0415 05/19/14 0802  GLUCAP 82 87 99    Scheduled Meds: . aspirin EC  81 mg Oral Daily  . enoxaparin (LOVENOX) injection  40 mg Subcutaneous Q24H  .  folic acid  1 mg Oral Daily  . insulin aspart  0-15 Units Subcutaneous 6 times per day  . insulin glargine  30 Units Subcutaneous BID  . multivitamin with minerals  1 tablet Oral Daily  . pantoprazole  40 mg Oral BID  . simvastatin  40 mg Oral q1800  . thiamine  100 mg Intravenous Daily    Continuous Infusions: . dextrose 5 % and 0.9% NaCl      Past Medical History  Diagnosis Date  . Hypertension   . Blood transfusion 2014    "related to bleeding ulcer"  . Venous insufficiency 12/13/2011  . Cardiac arrest 12/07/2012  . Anxiety   . Acute renal insufficiency 12/08/2012  . High cholesterol   . OSA on CPAP 12/07/2012  . Type II diabetes mellitus   . Bleeding ulcer 2014  . GERD (gastroesophageal reflux disease)     Past Surgical History  Procedure Laterality Date  . Knee  arthroscopy Left ~ 1999  . Esophagogastroduodenoscopy Left 11/26/2012    Procedure: ESOPHAGOGASTRODUODENOSCOPY (EGD);  Surgeon: Wonda Horner, MD;  Location: [facility] ENDOSCOPY;  Service: Endoscopy;  Laterality: Left;  . Cardiovascular stress test  03/16/13    Very Poor Exercise Tolerance; NON DIAGNOSTIC TEST  . Cataract extraction w/ intraocular lens implant Left 03/07/2014    [facility] @ [facility], [place], [place] [place] Pager # [phone] After hours/ weekend pager # [phone]

## 2014-05-19 NOTE — H&P (Signed)
Seen and examined.  Agree with the documentation and management of Dr. Bonner Puna.  Briefly, 48 yo male with epigastric pain, history of both pancreatitis and alcohol abuse.  Elevated lipase confirms pancreatitis.  He also has dehydration and mild ARF.  Agree with admit, hydrate and pain control.  He states that he has cut back - no more than 12 beers per week.  Still I believe alcohol induced pancreatitis is the diagnosis and I do not intend a big diagnostic work up.

## 2014-05-20 DIAGNOSIS — K859 Acute pancreatitis without necrosis or infection, unspecified: Principal | ICD-10-CM

## 2014-05-20 LAB — GLUCOSE, CAPILLARY
GLUCOSE-CAPILLARY: 111 mg/dL — AB (ref 70–99)
GLUCOSE-CAPILLARY: 133 mg/dL — AB (ref 70–99)
GLUCOSE-CAPILLARY: 46 mg/dL — AB (ref 70–99)
Glucose-Capillary: 147 mg/dL — ABNORMAL HIGH (ref 70–99)
Glucose-Capillary: 39 mg/dL — CL (ref 70–99)
Glucose-Capillary: 57 mg/dL — ABNORMAL LOW (ref 70–99)
Glucose-Capillary: 73 mg/dL (ref 70–99)

## 2014-05-20 LAB — COMPREHENSIVE METABOLIC PANEL
ALT: 53 U/L (ref 0–53)
AST: 65 U/L — ABNORMAL HIGH (ref 0–37)
Albumin: 3.4 g/dL — ABNORMAL LOW (ref 3.5–5.2)
Alkaline Phosphatase: 91 U/L (ref 39–117)
Anion gap: 14 (ref 5–15)
BILIRUBIN TOTAL: 0.8 mg/dL (ref 0.3–1.2)
BUN: 14 mg/dL (ref 6–23)
CHLORIDE: 96 meq/L (ref 96–112)
CO2: 25 mEq/L (ref 19–32)
Calcium: 8.2 mg/dL — ABNORMAL LOW (ref 8.4–10.5)
Creatinine, Ser: 0.89 mg/dL (ref 0.50–1.35)
Glucose, Bld: 126 mg/dL — ABNORMAL HIGH (ref 70–99)
POTASSIUM: 3.6 meq/L — AB (ref 3.7–5.3)
SODIUM: 135 meq/L — AB (ref 137–147)
TOTAL PROTEIN: 7.3 g/dL (ref 6.0–8.3)

## 2014-05-20 MED ORDER — LOSARTAN POTASSIUM 50 MG PO TABS
100.0000 mg | ORAL_TABLET | Freq: Every day | ORAL | Status: DC
  Administered 2014-05-20: 100 mg via ORAL
  Filled 2014-05-20: qty 2

## 2014-05-20 MED ORDER — INSULIN ASPART 100 UNIT/ML ~~LOC~~ SOLN: 2.0000 [IU] | Freq: Three times a day (TID) | SUBCUTANEOUS | Status: DC

## 2014-05-20 MED ORDER — PANTOPRAZOLE SODIUM 40 MG PO TBEC: 40.0000 mg | DELAYED_RELEASE_TABLET | Freq: Every day | ORAL | Status: DC

## 2014-05-20 MED ORDER — SIMETHICONE 80 MG PO CHEW
80.0000 mg | CHEWABLE_TABLET | Freq: Every day | ORAL | Status: DC
  Administered 2014-05-20: 80 mg via ORAL
  Filled 2014-05-20: qty 1

## 2014-05-20 MED ORDER — DEXTROSE 50 % IV SOLN
25.0000 mL | Freq: Once | INTRAVENOUS | Status: AC | PRN
  Administered 2014-05-20: 25 mL via INTRAVENOUS
  Filled 2014-05-20: qty 50

## 2014-05-20 MED ORDER — LOSARTAN POTASSIUM 100 MG PO TABS: 100.0000 mg | ORAL_TABLET | Freq: Every day | ORAL | Status: DC

## 2014-05-20 MED ORDER — PRAVASTATIN SODIUM 80 MG PO TABS: 80.0000 mg | ORAL_TABLET | Freq: Every day | ORAL | Status: DC

## 2014-05-20 MED ORDER — VITAMIN B-1 100 MG PO TABS
100.0000 mg | ORAL_TABLET | Freq: Every day | ORAL | Status: DC
  Administered 2014-05-20: 100 mg via ORAL
  Filled 2014-05-20: qty 1

## 2014-05-20 MED ORDER — INSULIN GLARGINE 100 UNIT/ML ~~LOC~~ SOLN: 30.0000 [IU] | Freq: Two times a day (BID) | SUBCUTANEOUS | Status: DC

## 2014-05-20 MED ORDER — POTASSIUM CHLORIDE CRYS ER 20 MEQ PO TBCR: 40.0000 meq | EXTENDED_RELEASE_TABLET | Freq: Once | ORAL | Status: DC

## 2014-05-20 MED ORDER — GLUCERNA SHAKE PO LIQD
237.0000 mL | Freq: Two times a day (BID) | ORAL | Status: DC
  Administered 2014-05-20: 237 mL via ORAL

## 2014-05-20 MED ORDER — SIMETHICONE 80 MG PO CHEW: 80.0000 mg | CHEWABLE_TABLET | Freq: Four times a day (QID) | ORAL | Status: DC | PRN

## 2014-05-20 MED ORDER — METOPROLOL SUCCINATE ER 100 MG PO TB24: 100.0000 mg | ORAL_TABLET | Freq: Every day | ORAL | Status: DC

## 2014-05-20 MED FILL — Ondansetron HCl Inj 4 MG/2ML (2 MG/ML): INTRAMUSCULAR | Qty: 2 | Status: AC

## 2014-05-20 NOTE — Progress Notes (Signed)
Pt alert and oriented, states he does not feel like he is having low blood sugar.  Encouraged po intake due to poor appetite.

## 2014-05-20 NOTE — Progress Notes (Signed)
Seen and examined.  Agree with the documentation and management of Dr. Gerarda Fraction.  Pain is largely resolved.  Tolerating PO well.  DC today.  No alcohol.

## 2014-05-20 NOTE — Progress Notes (Signed)
Pt discharge instructions given, pt verbalized understanding.  VSS. Denies pain. Pt left floor ambulating accompanied by family.

## 2014-05-20 NOTE — Clinical Documentation Improvement (Signed)
"  ETOH +" documented in current medical record.  Known history of alcohol use.  Blood alcohol level this admission - 115 mg/dL.  Possible Clinical Conditions, if applicable:   - Acute Alcohol Intoxication with continuous use ( please include any treatment provided, if applicable)   - Other Condition   - Unable to Clinically Determine if acute alcohol intoxication is present this admission.  Thank You, Erling Conte ,RN BAN CCDS  Clinical Documentation Specialist:  [place] Information Management

## 2014-05-20 NOTE — Progress Notes (Signed)
Pt CBG 39, pt A&OX4, pt states he feels shaky. Gave juice 4 oz and pt eating breakfast. Will recheck.

## 2014-05-20 NOTE — Progress Notes (Signed)
Patient ID: Jeffrey [name], male   DOB: Jul 10, 1966, 48 y.o.   MRN: [id number] Family Medicine Teaching Service Daily Progress Note Intern Pager: [phone]  Patient name: Jeffrey [name] Medical record number: [id number] Date of birth: 04/12/1966 Age: 48 y.o. Gender: male  Primary Care Provider: Howard Pouch, DO Consultants: None Code Status: Full  Pt Overview and Major Events to Date:  8/12- Admitted for abdominal pain and dehydration 8/13- Bowel rest and fluids 8/14 - Hypoglycemia  Assessment and Plan: Jeffrey [name] is a 48 y.o. male presenting with abdominal pain, diarrhea and weakness . PMH is significant for alcohol abuse, DM2, HTN, and hypercholesterolemia.    Abdominal Pain - possible acute alcohol induced pancreatitis-Lipase mildly elevated (157) with epigastric tenderness; Improved -Lipase was elevated; amylase wnl -Diet advanced from NPO->Liquid->Soft -Tylenol and Norco PRN for pain -Defer CT abdomen at this time -Tolerating diet with no abdominal pain will go to regular diet at lunch -simethicone for gas daily  Dehydration and vomiting with AKI: Consistent with prerenal azotemia. Poor po intake worsened by EtOH intake.  Could get CT abdomen if not improving. However has improved and normalized. -BUN/Cr improved with IV hydration -IVF required as he cannot take PO at this time  Insulin-dependent T2DM: With a history of DKA documented in EMR. Patient became hypoglycemic(39) overnight likely due to NPO and clear liquid diet with continued lantus -Holding lantus for now due to low blood sugars; add back later today once starting to eat more -continue SSI with q4h CBGs  -continue to monitor and adjust as appropriate - Continue ASA, statin  - Care management consult for medication needs.   Anion gap metabolic acidemia: Bicarb 16 with anion gap of 31 on admission due to ketosis of fasting state, lactate, and EtOH. Repiratory status stable. Improved following IVF  administration and improvement in po intake.  - Continue to monitor  -AG 14 and bicarb 25 now -K was 3.6 s/p 65meq PO -repeat labs daily until corrected  Alcohol abuse: EtOH + in ED  - CIWA score been 0 - Thiamine, folate  - PPI BID -Monitor for DTs -Social work consult   HTN - BPs were stable when admitted but increasing so will restart home meds -Restarted metoprolol from his home regimen -Losartan started back as well   FEN/GI: NPO, NS @ 150cc/hr  Prophylaxis: PPI, lovenox  Disposition: Continue current management plan as above.   Subjective:  Patient is experiencing some side effects of hypoglycemia including HA and dizziness. He states that his abdominal pain is much improved and he has not experienced anymore pain with the advanced diet. He is not having any more nausea as well.    Objective: Temp:  [97.7 F (36.5 C)-98.4 F (36.9 C)] 98 F (36.7 C) (08/14 0549) Pulse Rate:  [63-90] 63 (08/14 0549) Resp:  [18] 18 (08/14 0549) BP: (138-173)/(81-102) 138/91 mmHg (08/14 0549) SpO2:  [98 %-100 %] 100 % (08/14 0549) Weight:  [264 lb 15.9 oz (120.2 kg)] 264 lb 15.9 oz (120.2 kg) (08/13 2140) Physical Exam: General: Obese 48 y.o. male laying in bed, NAD HEENT: EOMI, NCAT. No scleral icterus. Cardiovascular: RRR, no murmur or JVD  Respiratory: CTAB, no wheezes or crackles. Normal respiratory effort. Abdomen: Obese, +BS, soft, NT.  Extremities: No deformities or edema. Good cap refill  Skin: No jaundice, wounds or rashes  Neuro: A&O x3. No focal neurologic deficits.   Laboratory:  Recent Labs Lab 05/18/14 1346 05/18/14 1915 05/19/14 0624  WBC 9.8 11.1*  9.0  HGB 13.3 13.2 12.0*  HCT 38.8* 38.9* 36.8*  PLT 217 207 182    Recent Labs Lab 05/18/14 1346 05/18/14 1915 05/19/14 0624 05/20/14 0323  NA 131*  --  136* 135*  K 4.6  --  4.8 3.6*  CL 84*  --  94* 96  CO2 16*  --  21 25  BUN 33*  --  25* 14  CREATININE 1.44* 1.13 1.14 0.89  CALCIUM 9.5  --  8.3*  8.2*  PROT 8.3  --  7.4 7.3  BILITOT 0.6  --  0.8 0.8  ALKPHOS 106  --  91 91  ALT 71*  --  58* 53  AST 81*  --  64* 65*  GLUCOSE 197*  --  88 126*   Lipase 157  i-stat troponin neg Neg FOCB UDS +opiates A1C 9.0   Katheren Shams, DO 05/20/2014, 6:59 AM PGY-1, [place] Intern pager: [phone], text pages welcome

## 2014-05-20 NOTE — Progress Notes (Signed)
NUTRITION FOLLOW-UP  DOCUMENTATION CODES Per approved criteria  -Obesity Unspecified   INTERVENTION: Provide Glucerna Shake po BID, each supplement provides 220 kcal and 10 grams of protein.  NUTRITION DIAGNOSIS: Inadequate oral inake related to inability to eat as evidenced by NPO status, Resolved.  NEW NUTRITION Dx: Inadequate oral intake related to lack of appetite as evidenced by pt report and meal completion of 0-50%.  Goal: Pt to meet >/= 90% of their estimated nutrition needs, unmet  Monitor:  PO intake, weight trends, labs, I/O's  48 y.o. male  Admitting Dx: Dehydration  ASSESSMENT: Pt presenting with abdominal pain, diarrhea and weakness. Pt had worsening nausea vomiting and diarrhea for the past 4 days which has kept him from being able to keep anything down. PMH is significant for type 2 diabetes, HTN, and alcohol abuse. Pt on CIWA protocol.  8/13-Pt reports having no appetite. Pt has severe stomach pains and nausea. Pt reports he has not eaten little to nothing since Sunday as he can not keep it down. Pt reports his pain started Saturday. Prior to Saturday, pt reports he had a great appetite and ate great. Pt reports he has no recent weight loss. Pt is open to oral supplements, such as glucerna, once diet is advanced. Pt reports he drinks glucerna at home from time to time. Nutrition focused physical exam was not performed due to pt being in severe pain. However, there were no observed significant signs of fat and muscle mass loss.  8/14-Pt reports having a lack of appetite, however pt reports he has been making himself eat. Pt reports he currently has no stomach pains. Pt had a hypoglycemic event in the AM and reports he is now very fatigued and weak. Pt reports he would like Glucerna ordered. Will order.   Labs: Low sodium, potassium, calcium. High BUN, AST.  Height: Ht Readings from Last 1 Encounters:  05/18/14 5\' 10"  (1.778 m)    Weight: Wt Readings from Last  1 Encounters:  05/19/14 264 lb 15.9 oz (120.2 kg)   BMI:  Body mass index is 38.02 kg/(m^2). Class II obesity  Re-Estimated Nutritional Needs: Kcal: 2000-2200 Protein: 90-105 grams Fluid: 2 L- 2.2 L/day  Skin: no issues noted  Diet Order:  Soft diet   Intake/Output Summary (Last 24 hours) at 05/20/14 1120 Last data filed at 05/20/14 0300  Gross per 24 hour  Intake      0 ml  Output    300 ml  Net   -300 ml    Last BM: 8/11   Labs:   Recent Labs Lab 05/18/14 1346 05/18/14 1915 05/19/14 0624 05/20/14 0323  NA 131*  --  136* 135*  K 4.6  --  4.8 3.6*  CL 84*  --  94* 96  CO2 16*  --  21 25  BUN 33*  --  25* 14  CREATININE 1.44* 1.13 1.14 0.89  CALCIUM 9.5  --  8.3* 8.2*  GLUCOSE 197*  --  88 126*    CBG (last 3)   Recent Labs  05/20/14 0120 05/20/14 0752 05/20/14 0818  GLUCAP 147* 39* 73    Scheduled Meds: . aspirin EC  81 mg Oral Daily  . enoxaparin (LOVENOX) injection  40 mg Subcutaneous Q24H  . folic acid  1 mg Oral Daily  . insulin aspart  0-15 Units Subcutaneous 6 times per day  . losartan  100 mg Oral Daily  . metoprolol succinate  100 mg Oral Daily  . multivitamin  with minerals  1 tablet Oral Daily  . pantoprazole  40 mg Oral BID  . potassium chloride  40 mEq Oral Once  . simethicone  80 mg Oral Daily  . simvastatin  40 mg Oral q1800  . thiamine  100 mg Oral Daily    Continuous Infusions: . sodium chloride 150 mL/hr at 05/20/14 [id number]    Past Medical History  Diagnosis Date  . Hypertension   . Blood transfusion 2014    "related to bleeding ulcer"  . Venous insufficiency 12/13/2011  . Cardiac arrest 12/07/2012  . Anxiety   . Acute renal insufficiency 12/08/2012  . High cholesterol   . OSA on CPAP 12/07/2012  . Type II diabetes mellitus   . Bleeding ulcer 2014  . GERD (gastroesophageal reflux disease)     Past Surgical History  Procedure Laterality Date  . Knee arthroscopy Left ~ 1999  . Esophagogastroduodenoscopy Left 11/26/2012     Procedure: ESOPHAGOGASTRODUODENOSCOPY (EGD);  Surgeon: Wonda Horner, MD;  Location: [facility] ENDOSCOPY;  Service: Endoscopy;  Laterality: Left;  . Cardiovascular stress test  03/16/13    Very Poor Exercise Tolerance; NON DIAGNOSTIC TEST  . Cataract extraction w/ intraocular lens implant Left 03/07/2014    [facility] @ [facility], [place], [place] [place] Pager # [phone] After hours/ weekend pager # [phone]

## 2014-05-20 NOTE — Progress Notes (Signed)
CBG: 46  Treatment: 15 GM carbohydrate snack  Symptoms: None  Follow-up CBG: Time:0041 CBG Result:57  Possible Reasons for Event: Unknown  Treatment: D50 IV 25 mL  Symptoms: None  Follow-up CBG: Time:0120 CBG Result:147  Possible Reasons for Event: Unknown  Comments/MD notified: Page to [phone] with callback from Dr. Lajuana Ripple for notification, will continue to monitor  Laurance Flatten, [place] Gaudy

## 2014-05-20 NOTE — Clinical Documentation Improvement (Addendum)
  Urine Drug Screen this admission - + opiates.  "UDS + for opiates" documented in current medical record.  Possible Clinical Conditions:   - Current Opiate Use including circumstances of use, continuous, episodic   - Other Clinical Condition or Circumstance   - Unable to Clinically Determine  Thank You, Erling Conte ,RN BSN CCDS Clinical Documentation Specialist:  [phone]  [facility] Information Management

## 2014-05-20 NOTE — Discharge Summary (Signed)
[facility] Discharge Summary  Patient name: Jeffrey [name] Medical record number: [id number] Date of birth: 10-15-1965 Age: 48 y.o. Gender: male Date of Admission: 05/18/2014  Date of Discharge: 05/20/2014 Admitting Physician: Zigmund Gottron, MD  Primary Care Provider: Howard Pouch, DO Consultants: None  Indication for Hospitalization: Dehydration and abdominal pain  Discharge Diagnoses/Problem List:  Patient Active Problem List   Diagnosis Date Noted  . Dehydration 05/18/2014  . Hypovolemia dehydration 05/18/2014  . DKA (diabetic ketoacidoses) 01/04/2014  . OSA on CPAP 12/28/2012  . Acute renal insufficiency 12/08/2012  . Pulmonary edema 12/08/2012  . Hypoglycemia 12/07/2012  . Cardiac arrest 12/07/2012  . Diarrhea 12/07/2012  . Obesity 12/07/2012  . Anoxic brain injury 12/07/2012  . Acute respiratory failure 12/06/2012  . GI bleed 11/26/2012  . Venous insufficiency 12/13/2011  . Leg wound, left 12/03/2011  . INSOMNIA, CHRONIC 02/14/2009  . 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 ALCOHOL ABUSE, HX OF 11/10/2007  . ERECTILE DYSFUNCTION 04/14/2007  . DM type 2 goal A1C below 7.5 12/04/2006  . HYPERCHOLESTEROLEMIA 12/04/2006  . OBESITY, NOS 12/04/2006  . ANXIETY 12/04/2006  . HYPERTENSION, BENIGN SYSTEMIC 12/04/2006  . ARTHRITIS 12/04/2006   Disposition: Discharge Home  Discharge Condition: Stable  Discharge Exam: General: Obese 48 y.o. male laying in bed, NAD  HEENT: EOMI, NCAT. No scleral icterus.  Cardiovascular: RRR, no murmur or JVD  Respiratory: CTAB, no wheezes or crackles. Normal respiratory effort.  Abdomen: Obese, +BS, soft, NT.  Extremities: No deformities or edema. Good cap refill  Skin: No jaundice, wounds or rashes  Neuro: A&O x3. No focal neurologic deficits.   Brief Hospital Course:  Jeffrey [name] is a 48 y.o. male who presented with abdominal pain, diarrhea and weakness. PMH is  significant for alcohol abuse, DM2, HTN, and hypercholesterolemia. He had epigastric pain with history of both pancreatitis and alcohol abuse. Elevated lipase confirmed what we believed to be alcohol induced pancreatitis. He also had dehydration and AKI. Treatment for patient's pancreatitis included bowel rest, fluids, and advancing of diet. Patient's AKI was consistent with prerenal azotemia. BUN/Cr improved with IV hydration. A big diagnostic workup for pancreatitis was not done at this time. Over the course of his hospital stay the patient's epigastric pain and nausea improved. There were no incidents of diarrhea. He was able to tolerate a regular diet. Protonix was given due to possible GERD component.  For management of the patient's other problems please see below: Insulin-dependent T2DM: Patient became hypoglycemic(39) likely due to NPO and clear liquid diet with continued insulin  -SSI with q4h CBGs  -Continued ASA, statin  -Lantus 30U BID Alcohol abuse: EtOH + in ED  - CIWA scores 0  - Thiamine, folate  HTN  -Restarted metoprolol and losartan from his home regimen   Issues for Follow Up:  -Education on excessive alcohol use -Better glycemic control; may need adjustment to insulin regimen. A1C 9.0  Significant Procedures: None  Significant Labs and Imaging:   Recent Labs Lab 05/18/14 1346 05/18/14 1915 05/19/14 0624  WBC 9.8 11.1* 9.0  HGB 13.3 13.2 12.0*  HCT 38.8* 38.9* 36.8*  PLT 217 207 182    Recent Labs Lab 05/18/14 1346 05/18/14 1915 05/19/14 0624 05/20/14 0323  NA 131*  --  136* 135*  K 4.6  --  4.8 3.6*  CL 84*  --  94* 96  CO2 16*  --  21 25  GLUCOSE 197*  --  88  126*  BUN 33*  --  25* 14  CREATININE 1.44* 1.13 1.14 0.89  CALCIUM 9.5  --  8.3* 8.2*  ALKPHOS 106  --  91 91  AST 81*  --  64* 65*  ALT 71*  --  58* 53  ALBUMIN 3.8  --  3.3* 3.4*   Lipase 157  i-stat troponin neg  Neg FOCB  UDS +opiates  A1C 9.0  Results/Tests Pending at Time of  Discharge: None  Discharge Medications:    Medication List         aspirin EC 81 MG tablet  Take 81 mg by mouth daily.     insulin aspart 100 UNIT/ML injection  Commonly known as:  novoLOG  Inject 2-6 Units into the skin 3 (three) times daily before meals. On sliding scale     insulin glargine 100 UNIT/ML injection  Commonly known as:  LANTUS  Inject 0.3 mLs (30 Units total) into the skin 2 (two) times daily.     losartan 100 MG tablet  Commonly known as:  COZAAR  Take 1 tablet (100 mg total) by mouth daily.     metoprolol succinate 100 MG 24 hr tablet  Commonly known as:  TOPROL-XL  Take 1 tablet (100 mg total) by mouth daily. Take with or immediately following a meal.     multivitamin with minerals Tabs tablet  Take 1 tablet by mouth daily.     pantoprazole 40 MG tablet  Commonly known as:  PROTONIX  Take 1 tablet (40 mg total) by mouth daily.     pravastatin 80 MG tablet  Commonly known as:  PRAVACHOL  Take 1 tablet (80 mg total) by mouth daily.     simethicone 80 MG chewable tablet  Commonly known as:  MYLICON  Chew 1 tablet (80 mg total) by mouth every 6 (six) hours as needed for flatulence.     traZODone 100 MG tablet  Commonly known as:  DESYREL  Take 100 mg by mouth at bedtime.        Discharge Instructions: Please refer to Patient Instructions section of EMR for full details.  Patient was counseled important signs and symptoms that should prompt return to medical care, changes in medications, dietary instructions, activity restrictions, and follow up appointments.   Follow-Up Appointments:     Follow-up Information   Follow up with Cordelia Poche, MD On 05/26/2014. (For hospital follow-up 3:00 PM)    Specialty:  Family Medicine   Contact information:   [place] [address] [phone]       Katheren Shams, DO 05/20/2014, 1:46 PM PGY-1, [place]

## 2014-05-20 NOTE — Progress Notes (Signed)
Note / chart reviewed. No changes. Pryor Ochoa RD, LDN Pager: [phone]

## 2014-05-20 NOTE — Discharge Instructions (Signed)
You were admitted for your abdominal pain, nausea, and vomiting, which has been better with medications. You can continue to use simethicone for gas, which can be bought over-the-counter. You should continue your regular home medicines. Make sure you are eating and drinking well and continue to take your insulin as prescribed. You have a follow-up appointment on next Thursday at 3:00 PM with Dr. Lonny Prude. If this time does not work, you can call [phone] to reschedule, or you can call the same number at any time with questions / concerns.

## 2014-05-20 NOTE — Clinical Documentation Improvement (Addendum)
  Known history of DM2, without any documented complications, not specified as controlled, poorly controlled or uncontrolled per current medical record.  Hgb A1C this admission - 9.0  Possible Conditions:  - DM2, please include with OR without any diabetic associated conditions, poorly controlled or uncontrolled, including any associated treatment or medication adjustments   - Other Condition   - Unable to Clinically Determine  Thank You,  Erling Conte ,RN BSN CCDS Certified Clinical Documentation Specialist:  [place] Information Management

## 2014-05-20 NOTE — Progress Notes (Signed)
Inpatient Diabetes Program Recommendations  AACE/ADA: New Consensus Statement on Inpatient Glycemic Control (2013)  Target Ranges:  Prepandial:   less than 140 mg/dL      Peak postprandial:   less than 180 mg/dL (1-2 hours)      Critically ill patients:  140 - 180 mg/dL   Reason for Visit: low CBG  Diabetes history: Type 2 Outpatient Diabetes medications: Lantus 30 units bid, Novolog 2-6 units with meals Current orders for Inpatient glycemic control: Novolog correction 0-15mg  with meals  Patient received 30 units of Lantus insulin at 10:11am and 23:46pm yesterday resulting in low CBG this am.  Patient needs basal insulin. Please consider ordering Lantus insulin, 30 units starting tonight at hs.   Gentry Fitz, RN, BA, MHA, CDE Diabetes Coordinator Inpatient Diabetes Program  [phone] (Team Pager) [phone] [facility]) 05/20/2014 10:54 AM

## 2014-05-20 NOTE — Care Management Note (Signed)
CARE MANAGEMENT NOTE 05/20/2014  Patient:  Jeffrey [name], Jeffrey [name]   Account Number:  [id number]  Date Initiated:  05/19/2014  Documentation initiated by:  Jasmine Pang  Subjective/Objective Assessment:   Noted referral due to medication cost.     Action/Plan:   05/19/2014 Also noted that pt has commercial insurance with medication benefits. Will advise pt to research Needymeds.com for manufacture discounts related to family income. Will provide applications if pt is unable to access this website.   Anticipated DC Date:  05/22/2014   Anticipated DC Plan:  [place] [facility]      Choice offered to / List presented to:             Status of service:   Medicare Important Message given?  NO (If response is "NO", the following Medicare IM given date fields will be blank) Date Medicare IM given:   Medicare IM given by:   Date Additional Medicare IM given:   Additional Medicare IM given by:    Discharge Disposition:  HOME/SELF CARE  Per UR Regulation:    If discussed at Long Length of Stay Meetings, dates discussed:    Comments:   05/19/2014 [place] letter given to pt and prescriptions, this CM explained use of this letter and $3 per prescription cost for 30 day supply only. Discuss application for [facility] card and possible use of county pharmacy at the health dept ( MAP program). Pt anticipates follow up at the family practice center. CRoyal RN MPH, case manager, [phone]  05/19/2014 Will continue to monitor for d/c needs, noted that pt is + for alcohol intake in the ED. Perhaps monies used for alcohol could be redirected for medications. Jasmine Pang RN MPH, case manager, [phone]

## 2014-05-20 NOTE — Progress Notes (Signed)
Paged teaching service.  

## 2014-05-21 NOTE — Discharge Summary (Signed)
Seen and examined on the day of DC.  Agree with documentation and management by Dr. Gerarda Fraction.

## 2014-05-23 ENCOUNTER — Ambulatory Visit: Payer: BC Managed Care – PPO

## 2014-05-26 ENCOUNTER — Inpatient Hospital Stay: Payer: BC Managed Care – PPO | Admitting: Family Medicine

## 2014-06-15 ENCOUNTER — Telehealth: Payer: Self-pay | Admitting: Family Medicine

## 2014-06-15 NOTE — Telephone Encounter (Signed)
Pt states that his BP has been very low. Would like to speak to a nurse/MD. Pls call

## 2014-06-15 NOTE — Telephone Encounter (Signed)
Returned call to pt regarding low blood pressure.  Pt stated his wife took his BP at 7:41 AM it was 74/45, heart rate 79 and about 15-20 minutes before calling clinic blood pressure 61/29.  Pt stated if he gets up he will be dizzy; pt could not tell nurse is he was having any other symptoms.  Advised to go to emergency department now for evaluation.  Precepted with Dr. Andria Frames agree with plan.  Derl Barrow, RN

## 2014-08-21 ENCOUNTER — Inpatient Hospital Stay (HOSPITAL_COMMUNITY)
Admission: EM | Admit: 2014-08-21 | Discharge: 2014-08-25 | DRG: 637 | Disposition: A | Discharge status: Home Health | Payer: Self-pay | Attending: Family Medicine | Admitting: Family Medicine

## 2014-08-21 ENCOUNTER — Encounter (HOSPITAL_COMMUNITY): Payer: Self-pay | Admitting: Emergency Medicine

## 2014-08-21 ENCOUNTER — Emergency Department (HOSPITAL_COMMUNITY): Payer: Self-pay

## 2014-08-21 DIAGNOSIS — Z794 Long term (current) use of insulin: Secondary | ICD-10-CM

## 2014-08-21 DIAGNOSIS — D72829 Elevated white blood cell count, unspecified: Secondary | ICD-10-CM | POA: Diagnosis present

## 2014-08-21 DIAGNOSIS — E860 Dehydration: Secondary | ICD-10-CM | POA: Diagnosis present

## 2014-08-21 DIAGNOSIS — F1021 Alcohol dependence, in remission: Secondary | ICD-10-CM

## 2014-08-21 DIAGNOSIS — J690 Pneumonitis due to inhalation of food and vomit: Secondary | ICD-10-CM | POA: Diagnosis present

## 2014-08-21 DIAGNOSIS — K219 Gastro-esophageal reflux disease without esophagitis: Secondary | ICD-10-CM | POA: Diagnosis present

## 2014-08-21 DIAGNOSIS — I872 Venous insufficiency (chronic) (peripheral): Secondary | ICD-10-CM | POA: Diagnosis present

## 2014-08-21 DIAGNOSIS — R059 Cough, unspecified: Secondary | ICD-10-CM | POA: Diagnosis present

## 2014-08-21 DIAGNOSIS — J189 Pneumonia, unspecified organism: Secondary | ICD-10-CM | POA: Insufficient documentation

## 2014-08-21 DIAGNOSIS — K226 Gastro-esophageal laceration-hemorrhage syndrome: Secondary | ICD-10-CM | POA: Diagnosis present

## 2014-08-21 DIAGNOSIS — G629 Polyneuropathy, unspecified: Secondary | ICD-10-CM | POA: Diagnosis present

## 2014-08-21 DIAGNOSIS — I10 Essential (primary) hypertension: Secondary | ICD-10-CM | POA: Diagnosis present

## 2014-08-21 DIAGNOSIS — E785 Hyperlipidemia, unspecified: Secondary | ICD-10-CM | POA: Diagnosis present

## 2014-08-21 DIAGNOSIS — F10239 Alcohol dependence with withdrawal, unspecified: Secondary | ICD-10-CM | POA: Diagnosis present

## 2014-08-21 DIAGNOSIS — G4733 Obstructive sleep apnea (adult) (pediatric): Secondary | ICD-10-CM | POA: Diagnosis present

## 2014-08-21 DIAGNOSIS — K920 Hematemesis: Secondary | ICD-10-CM | POA: Diagnosis present

## 2014-08-21 DIAGNOSIS — Z7982 Long term (current) use of aspirin: Secondary | ICD-10-CM

## 2014-08-21 DIAGNOSIS — E1110 Type 2 diabetes mellitus with ketoacidosis without coma: Secondary | ICD-10-CM | POA: Diagnosis present

## 2014-08-21 DIAGNOSIS — R05 Cough: Secondary | ICD-10-CM

## 2014-08-21 DIAGNOSIS — E875 Hyperkalemia: Secondary | ICD-10-CM | POA: Diagnosis present

## 2014-08-21 DIAGNOSIS — E1310 Other specified diabetes mellitus with ketoacidosis without coma: Principal | ICD-10-CM | POA: Diagnosis present

## 2014-08-21 DIAGNOSIS — K922 Gastrointestinal hemorrhage, unspecified: Secondary | ICD-10-CM | POA: Diagnosis present

## 2014-08-21 DIAGNOSIS — N179 Acute kidney failure, unspecified: Secondary | ICD-10-CM | POA: Diagnosis present

## 2014-08-21 DIAGNOSIS — F419 Anxiety disorder, unspecified: Secondary | ICD-10-CM | POA: Diagnosis present

## 2014-08-21 DIAGNOSIS — E1140 Type 2 diabetes mellitus with diabetic neuropathy, unspecified: Secondary | ICD-10-CM

## 2014-08-21 DIAGNOSIS — E7800 Pure hypercholesterolemia, unspecified: Secondary | ICD-10-CM | POA: Diagnosis present

## 2014-08-21 DIAGNOSIS — Z888 Allergy status to other drugs, medicaments and biological substances status: Secondary | ICD-10-CM

## 2014-08-21 DIAGNOSIS — R0602 Shortness of breath: Secondary | ICD-10-CM | POA: Insufficient documentation

## 2014-08-21 DIAGNOSIS — E1010 Type 1 diabetes mellitus with ketoacidosis without coma: Secondary | ICD-10-CM

## 2014-08-21 LAB — I-STAT VENOUS BLOOD GAS, ED
Acid-base deficit: 17 mmol/L — ABNORMAL HIGH (ref 0.0–2.0)
BICARBONATE: 11.2 meq/L — AB (ref 20.0–24.0)
O2 Saturation: 51 %
TCO2: 12 mmol/L (ref 0–100)
pCO2, Ven: 33.1 mmHg — ABNORMAL LOW (ref 45.0–50.0)
pH, Ven: 7.136 — CL (ref 7.250–7.300)
pO2, Ven: 35 mmHg (ref 30.0–45.0)

## 2014-08-21 LAB — CBC
HCT: 42.2 % (ref 39.0–52.0)
Hemoglobin: 13.9 g/dL (ref 13.0–17.0)
MCH: 34.1 pg — AB (ref 26.0–34.0)
MCHC: 32.9 g/dL (ref 30.0–36.0)
MCV: 103.4 fL — ABNORMAL HIGH (ref 78.0–100.0)
PLATELETS: 235 10*3/uL (ref 150–400)
RBC: 4.08 MIL/uL — AB (ref 4.22–5.81)
RDW: 14.3 % (ref 11.5–15.5)
WBC: 21.2 10*3/uL — AB (ref 4.0–10.5)

## 2014-08-21 LAB — BASIC METABOLIC PANEL
Anion gap: 56 — ABNORMAL HIGH (ref 5–15)
Anion gap: 52 — ABNORMAL HIGH (ref 5–15)
Anion gap: 45 — ABNORMAL HIGH (ref 5–15)
Anion gap: 39 — ABNORMAL HIGH (ref 5–15)
Anion gap: 31 — ABNORMAL HIGH (ref 5–15)
BUN: 31 mg/dL — ABNORMAL HIGH (ref 6–23)
BUN: 39 mg/dL — AB (ref 6–23)
BUN: 40 mg/dL — AB (ref 6–23)
BUN: 41 mg/dL — AB (ref 6–23)
BUN: 42 mg/dL — AB (ref 6–23)
CALCIUM: 9.1 mg/dL (ref 8.4–10.5)
CALCIUM: 9.2 mg/dL (ref 8.4–10.5)
CALCIUM: 9.3 mg/dL (ref 8.4–10.5)
CHLORIDE: 70 meq/L — AB (ref 96–112)
CO2: 9 mEq/L — CL (ref 19–32)
CO2: 7 meq/L — AB (ref 19–32)
CO2: 11 meq/L — AB (ref 19–32)
CO2: 16 mEq/L — ABNORMAL LOW (ref 19–32)
CO2: 22 meq/L (ref 19–32)
CREATININE: 1.86 mg/dL — AB (ref 0.50–1.35)
Calcium: 10.7 mg/dL — ABNORMAL HIGH (ref 8.4–10.5)
Calcium: 9.4 mg/dL (ref 8.4–10.5)
Chloride: 80 mEq/L — ABNORMAL LOW (ref 96–112)
Chloride: 83 mEq/L — ABNORMAL LOW (ref 96–112)
Chloride: 88 mEq/L — ABNORMAL LOW (ref 96–112)
Chloride: 92 mEq/L — ABNORMAL LOW (ref 96–112)
Creatinine, Ser: 2.14 mg/dL — ABNORMAL HIGH (ref 0.50–1.35)
Creatinine, Ser: 2.2 mg/dL — ABNORMAL HIGH (ref 0.50–1.35)
Creatinine, Ser: 2.19 mg/dL — ABNORMAL HIGH (ref 0.50–1.35)
Creatinine, Ser: 2.07 mg/dL — ABNORMAL HIGH (ref 0.50–1.35)
GFR calc Af Amer: 40 mL/min — ABNORMAL LOW (ref >90)
GFR calc Af Amer: 38 mL/min — ABNORMAL LOW (ref >90)
GFR calc Af Amer: 39 mL/min — ABNORMAL LOW (ref >90)
GFR calc Af Amer: 41 mL/min — ABNORMAL LOW (ref >90)
GFR calc non Af Amer: 41 mL/min — ABNORMAL LOW (ref >90)
GFR, EST AFRICAN AMERICAN: 47 mL/min — AB (ref >90)
GFR, EST NON AFRICAN AMERICAN: 34 mL/min — AB (ref >90)
GFR, EST NON AFRICAN AMERICAN: 33 mL/min — AB (ref >90)
GFR, EST NON AFRICAN AMERICAN: 33 mL/min — AB (ref >90)
GFR, EST NON AFRICAN AMERICAN: 36 mL/min — AB (ref >90)
GLUCOSE: 675 mg/dL — AB (ref 70–99)
Glucose, Bld: 759 mg/dL (ref 70–99)
Glucose, Bld: 505 mg/dL — ABNORMAL HIGH (ref 70–99)
Glucose, Bld: 386 mg/dL — ABNORMAL HIGH (ref 70–99)
Glucose, Bld: 297 mg/dL — ABNORMAL HIGH (ref 70–99)
POTASSIUM: 4.2 meq/L (ref 3.7–5.3)
POTASSIUM: 4.3 meq/L (ref 3.7–5.3)
Potassium: 6.5 mEq/L (ref 3.7–5.3)
Potassium: 5.8 mEq/L — ABNORMAL HIGH (ref 3.7–5.3)
Potassium: 4.7 mEq/L (ref 3.7–5.3)
SODIUM: 135 meq/L — AB (ref 137–147)
SODIUM: 143 meq/L (ref 137–147)
SODIUM: 145 meq/L (ref 137–147)
Sodium: 139 mEq/L (ref 137–147)
Sodium: 139 mEq/L (ref 137–147)

## 2014-08-21 LAB — CBC WITH DIFFERENTIAL/PLATELET
BASOS ABS: 0 10*3/uL (ref 0.0–0.1)
BASOS PCT: 0 % (ref 0–1)
Eosinophils Absolute: 0 10*3/uL (ref 0.0–0.7)
Eosinophils Relative: 0 % (ref 0–5)
HCT: 49.5 % (ref 39.0–52.0)
Hemoglobin: 16 g/dL (ref 13.0–17.0)
Lymphocytes Relative: 5 % — ABNORMAL LOW (ref 12–46)
Lymphs Abs: 0.9 10*3/uL (ref 0.7–4.0)
MCH: 34.6 pg — ABNORMAL HIGH (ref 26.0–34.0)
MCHC: 32.3 g/dL (ref 30.0–36.0)
MCV: 107.1 fL — ABNORMAL HIGH (ref 78.0–100.0)
MONO ABS: 1 10*3/uL (ref 0.1–1.0)
Monocytes Relative: 6 % (ref 3–12)
NEUTROS ABS: 16 10*3/uL — AB (ref 1.7–7.7)
Neutrophils Relative %: 89 % — ABNORMAL HIGH (ref 43–77)
PLATELETS: 258 10*3/uL (ref 150–400)
RBC: 4.62 MIL/uL (ref 4.22–5.81)
RDW: 14.5 % (ref 11.5–15.5)
WBC: 18 10*3/uL — ABNORMAL HIGH (ref 4.0–10.5)

## 2014-08-21 LAB — GLUCOSE, CAPILLARY
GLUCOSE-CAPILLARY: 285 mg/dL — AB (ref 70–99)
Glucose-Capillary: >600 mg/dL (ref 70–99)
Glucose-Capillary: 595 mg/dL (ref 70–99)
Glucose-Capillary: 461 mg/dL — ABNORMAL HIGH (ref 70–99)
Glucose-Capillary: 364 mg/dL — ABNORMAL HIGH (ref 70–99)
Glucose-Capillary: 339 mg/dL — ABNORMAL HIGH (ref 70–99)
Glucose-Capillary: 291 mg/dL — ABNORMAL HIGH (ref 70–99)

## 2014-08-21 LAB — MRSA PCR SCREENING: MRSA by PCR: POSITIVE — AB

## 2014-08-21 LAB — CBG MONITORING, ED
Glucose-Capillary: >600 mg/dL (ref 70–99)
Glucose-Capillary: >600 mg/dL (ref 70–99)

## 2014-08-21 LAB — PROTIME-INR
INR: 1.25 (ref 0.00–1.49)
PROTHROMBIN TIME: 15.9 s — AB (ref 11.6–15.2)

## 2014-08-21 LAB — PRO B NATRIURETIC PEPTIDE: Pro B Natriuretic peptide (BNP): 433.9 pg/mL — ABNORMAL HIGH (ref 0–125)

## 2014-08-21 MED ORDER — THIAMINE HCL 100 MG/ML IJ SOLN
100.0000 mg | Freq: Every day | INTRAMUSCULAR | Status: DC
  Administered 2014-08-22: 100 mg via INTRAVENOUS
  Filled 2014-08-21 (×5): qty 1

## 2014-08-21 MED ORDER — METOPROLOL TARTRATE 1 MG/ML IV SOLN
2.5000 mg | Freq: Once | INTRAVENOUS | Status: AC
  Administered 2014-08-21: 2.5 mg via INTRAVENOUS
  Filled 2014-08-21: qty 5

## 2014-08-21 MED ORDER — LORAZEPAM 2 MG/ML IJ SOLN
1.0000 mg | Freq: Four times a day (QID) | INTRAMUSCULAR | Status: DC | PRN
  Administered 2014-08-21 – 2014-08-22 (×2): 1 mg via INTRAVENOUS
  Filled 2014-08-21 (×2): qty 1

## 2014-08-21 MED ORDER — SODIUM CHLORIDE 0.9 % IV BOLUS (SEPSIS)
1000.0000 mL | Freq: Once | INTRAVENOUS | Status: AC
  Administered 2014-08-21: 1000 mL via INTRAVENOUS

## 2014-08-21 MED ORDER — DEXTROSE-NACL 5-0.45 % IV SOLN
INTRAVENOUS | Status: DC
  Administered 2014-08-21: 1000 mL via INTRAVENOUS

## 2014-08-21 MED ORDER — METOPROLOL TARTRATE 1 MG/ML IV SOLN
5.0000 mg | Freq: Two times a day (BID) | INTRAVENOUS | Status: DC
  Administered 2014-08-22: 5 mg via INTRAVENOUS
  Filled 2014-08-21 (×4): qty 5

## 2014-08-21 MED ORDER — SODIUM CHLORIDE 0.9 % IV SOLN
INTRAVENOUS | Status: DC
  Administered 2014-08-21: 11:00:00 via INTRAVENOUS

## 2014-08-21 MED ORDER — GABAPENTIN 600 MG PO TABS
300.0000 mg | ORAL_TABLET | Freq: Two times a day (BID) | ORAL | Status: DC
  Filled 2014-08-21: qty 0.5

## 2014-08-21 MED ORDER — METOPROLOL TARTRATE 1 MG/ML IV SOLN
2.5000 mg | Freq: Two times a day (BID) | INTRAVENOUS | Status: DC
  Administered 2014-08-21: 2.5 mg via INTRAVENOUS
  Filled 2014-08-21: qty 5

## 2014-08-21 MED ORDER — HYDROCERIN EX CREA
TOPICAL_CREAM | Freq: Two times a day (BID) | CUTANEOUS | Status: DC
  Administered 2014-08-21 – 2014-08-25 (×7): via TOPICAL
  Filled 2014-08-21: qty 113

## 2014-08-21 MED ORDER — ADULT MULTIVITAMIN W/MINERALS CH
1.0000 | ORAL_TABLET | Freq: Every day | ORAL | Status: DC
  Administered 2014-08-21 – 2014-08-25 (×4): 1 via ORAL
  Filled 2014-08-21 (×5): qty 1

## 2014-08-21 MED ORDER — ONDANSETRON HCL 4 MG/2ML IJ SOLN
4.0000 mg | Freq: Three times a day (TID) | INTRAMUSCULAR | Status: DC | PRN
  Administered 2014-08-22: 4 mg via INTRAVENOUS
  Filled 2014-08-21: qty 2

## 2014-08-21 MED ORDER — ONDANSETRON HCL 4 MG/2ML IJ SOLN
4.0000 mg | Freq: Once | INTRAMUSCULAR | Status: AC
  Administered 2014-08-21: 4 mg via INTRAVENOUS
  Filled 2014-08-21: qty 2

## 2014-08-21 MED ORDER — DEXTROSE 50 % IV SOLN: 25.0000 mL | INTRAVENOUS | Status: DC | PRN

## 2014-08-21 MED ORDER — PANTOPRAZOLE SODIUM 40 MG IV SOLR
40.0000 mg | Freq: Two times a day (BID) | INTRAVENOUS | Status: DC
  Administered 2014-08-21 – 2014-08-24 (×6): 40 mg via INTRAVENOUS
  Filled 2014-08-21 (×8): qty 40

## 2014-08-21 MED ORDER — SODIUM CHLORIDE 0.9 % IJ SOLN
3.0000 mL | Freq: Two times a day (BID) | INTRAMUSCULAR | Status: DC
  Administered 2014-08-21 – 2014-08-23 (×5): 3 mL via INTRAVENOUS

## 2014-08-21 MED ORDER — HYDROCERIN EX CREA: TOPICAL_CREAM | Freq: Two times a day (BID) | CUTANEOUS | Status: DC

## 2014-08-21 MED ORDER — SIMETHICONE 80 MG PO CHEW
80.0000 mg | CHEWABLE_TABLET | Freq: Four times a day (QID) | ORAL | Status: DC | PRN
  Administered 2014-08-21 – 2014-08-24 (×2): 80 mg via ORAL
  Filled 2014-08-21 (×5): qty 1

## 2014-08-21 MED ORDER — GABAPENTIN 300 MG PO CAPS
300.0000 mg | ORAL_CAPSULE | Freq: Two times a day (BID) | ORAL | Status: DC
  Administered 2014-08-21 – 2014-08-25 (×7): 300 mg via ORAL
  Filled 2014-08-21 (×9): qty 1

## 2014-08-21 MED ORDER — INSULIN REGULAR HUMAN 100 UNIT/ML IJ SOLN
INTRAMUSCULAR | Status: DC
  Administered 2014-08-21: 5.4 [IU]/h via INTRAVENOUS
  Filled 2014-08-21: qty 2.5

## 2014-08-21 MED ORDER — LORAZEPAM 1 MG PO TABS: 1.0000 mg | ORAL_TABLET | Freq: Four times a day (QID) | ORAL | Status: DC | PRN

## 2014-08-21 MED ORDER — SODIUM CHLORIDE 0.9 % IV SOLN
INTRAVENOUS | Status: DC
  Administered 2014-08-21: 15:00:00 via INTRAVENOUS

## 2014-08-21 MED ORDER — SODIUM CHLORIDE 0.9 % IV SOLN
INTRAVENOUS | Status: DC
  Administered 2014-08-21: 17:00:00 via INTRAVENOUS
  Administered 2014-08-23: 3.2 [IU]/h via INTRAVENOUS
  Filled 2014-08-21 (×2): qty 2.5

## 2014-08-21 MED ORDER — DEXTROSE-NACL 5-0.45 % IV SOLN: INTRAVENOUS | Status: DC

## 2014-08-21 MED ORDER — VITAMIN B-1 100 MG PO TABS
100.0000 mg | ORAL_TABLET | Freq: Every day | ORAL | Status: DC
  Administered 2014-08-21 – 2014-08-25 (×4): 100 mg via ORAL
  Filled 2014-08-21 (×5): qty 1

## 2014-08-21 MED ORDER — FOLIC ACID 1 MG PO TABS
1.0000 mg | ORAL_TABLET | Freq: Every day | ORAL | Status: DC
  Administered 2014-08-21 – 2014-08-25 (×4): 1 mg via ORAL
  Filled 2014-08-21 (×5): qty 1

## 2014-08-21 NOTE — Progress Notes (Signed)
Pt reports drinking 2 40oz bottles of beer daily. Notified Dr Dianah Field. Initiated CIWA protocol.

## 2014-08-21 NOTE — ED Notes (Signed)
Venous blood gas results  given to Dr. Zenia Resides on [facility]

## 2014-08-21 NOTE — ED Notes (Signed)
  CBG HI 

## 2014-08-21 NOTE — Progress Notes (Signed)
CRITICAL VALUE ALERT  Critical value received:  co2 = 7 and glucose = 675  Date of notification:  08/21/14  Time of notification:  1803  Critical value read back:Yes.    Nurse who received alert:  Yetta Barre, RN  MD notified (1st page):  Dr Dianah Field   Time of first page:  1806  MD notified (2nd page):  Time of second page:  Responding MD:  Dr Dianah Field  Time MD responded:  [phone]

## 2014-08-21 NOTE — ED Notes (Signed)
Attempted report 

## 2014-08-21 NOTE — ED Notes (Signed)
Patient come from home with complaints of N/V*3 days and bright red vomiting today.[name] states  CBG 575 Patient Vitals: 100% RA BP 150/90 . RR 16

## 2014-08-21 NOTE — ED Provider Notes (Signed)
CSN: [id number]     Arrival date & time 08/21/14  1032 History   First MD Initiated Contact with Patient 08/21/14 1042     Chief Complaint  Patient presents with  . Hyperglycemia  . Nausea     (Consider location/radiation/quality/duration/timing/severity/associated sxs/prior Treatment) HPI Comments: Patient here complaining of nausea vomiting and high blood sugars 4 days. He has been out of his insulin and high blood pressure medication. Has has no abdominal cramping but no actual pain. No diarrhea. Has had polyuria and polydipsia. Denies any chest pain. History of DKA and this is similar. Does note some dizziness when standing. Symptoms are persistent and nothing makes them better. No treatment use prior to arrival.  Patient is a 48 y.o. male presenting with hyperglycemia. The history is provided by the patient.  Hyperglycemia   Past Medical History  Diagnosis Date  . Hypertension   . Blood transfusion 2014    "related to bleeding ulcer"  . Venous insufficiency 12/13/2011  . Cardiac arrest 12/07/2012  . Anxiety   . Acute renal insufficiency 12/08/2012  . High cholesterol   . OSA on CPAP 12/07/2012  . Type II diabetes mellitus   . Bleeding ulcer 2014  . GERD (gastroesophageal reflux disease)    Past Surgical History  Procedure Laterality Date  . Knee arthroscopy Left ~ 1999  . Esophagogastroduodenoscopy Left 11/26/2012    Procedure: ESOPHAGOGASTRODUODENOSCOPY (EGD);  Surgeon: Wonda Horner, MD;  Location[facility] ENDOSCOPY;  Service: Endoscopy;  Laterality: Left;  . Cardiovascular stress test  03/16/13    Very Poor Exercise Tolerance; NON DIAGNOSTIC TEST  . Cataract extraction w/ intraocular lens implant Left 03/07/2014    [place] @ [facility]   No family history on file. History  Substance Use Topics  . Smoking status: Never Smoker   . Smokeless tobacco: Current User    Types: Snuff  . Alcohol Use: 3.6 oz/week    6 Cans of beer per week    Review of Systems  All other  systems reviewed and are negative.     Allergies  Lisinopril  Home Medications   Prior to Admission medications   Medication Sig Start Date End Date Taking? Authorizing Provider  aspirin EC 81 MG tablet Take 81 mg by mouth daily.    Historical Provider, MD  insulin aspart (NOVOLOG) 100 UNIT/ML injection Inject 2-6 Units into the skin 3 (three) times daily before meals. On sliding scale 05/20/14   Bernadene Bell, MD  insulin glargine (LANTUS) 100 UNIT/ML injection Inject 0.3 mLs (30 Units total) into the skin 2 (two) times daily. 05/20/14   Bernadene Bell, MD  losartan (COZAAR) 100 MG tablet Take 1 tablet (100 mg total) by mouth daily. 05/20/14   Bernadene Bell, MD  metoprolol succinate (TOPROL-XL) 100 MG 24 hr tablet Take 1 tablet (100 mg total) by mouth daily. Take with or immediately following a meal. 05/20/14   Bernadene Bell, MD  Multiple Vitamin (MULTIVITAMIN WITH MINERALS) TABS tablet Take 1 tablet by mouth daily.    Historical Provider, MD  pantoprazole (PROTONIX) 40 MG tablet Take 1 tablet (40 mg total) by mouth daily. 05/20/14   Bernadene Bell, MD  pravastatin (PRAVACHOL) 80 MG tablet Take 1 tablet (80 mg total) by mouth daily. 05/20/14   Bernadene Bell, MD  simethicone (MYLICON) 80 MG chewable tablet Chew 1 tablet (80 mg total) by mouth every 6 (six) hours as needed for flatulence. 05/20/14   Sharon Mt  Street, MD  traZODone (DESYREL) 100 MG tablet Take 100 mg by mouth at bedtime.    Historical Provider, MD   BP 135/76 mmHg  Temp(Src) 98 F (36.7 C)  Resp 34  SpO2 100% Physical Exam  Constitutional: He is oriented to person, place, and time. He appears well-developed and well-nourished.  Non-toxic appearance. No distress.  HENT:  Head: Normocephalic and atraumatic.  Eyes: Conjunctivae, EOM and lids are normal. Pupils are equal, round, and reactive to light.  Neck: Normal range of motion. Neck supple. No tracheal deviation present. No thyroid mass present.   Cardiovascular: Regular rhythm and normal heart sounds.  Tachycardia present.  Exam reveals no gallop.   No murmur heard. Pulmonary/Chest: Effort normal and breath sounds normal. No stridor. No respiratory distress. He has no decreased breath sounds. He has no wheezes. He has no rhonchi. He has no rales.  Abdominal: Soft. Normal appearance and bowel sounds are normal. He exhibits no distension. There is no tenderness. There is no rebound and no CVA tenderness.  Musculoskeletal: Normal range of motion. He exhibits no edema or tenderness.  Neurological: He is alert and oriented to person, place, and time. He has normal strength. No cranial nerve deficit or sensory deficit. GCS eye subscore is 4. GCS verbal subscore is 5. GCS motor subscore is 6.  Skin: Skin is warm and dry. No abrasion and no rash noted.  Psychiatric: He has a normal mood and affect. His speech is normal and behavior is normal.  Nursing note and vitals reviewed.   ED Course  Procedures (including critical care time) Labs Review Labs Reviewed  CBC WITH DIFFERENTIAL  BASIC METABOLIC PANEL  URINALYSIS, ROUTINE W REFLEX MICROSCOPIC  BLOOD GAS, VENOUS  CBG MONITORING, ED    Imaging Review No results found.   EKG Interpretation   Date/Time:  Sunday August 21 2014 10:42:39 EST Ventricular Rate:  115 PR Interval:  153 QRS Duration: 104 QT Interval:  358 QTC Calculation: 495 R Axis:   147 Text Interpretation:  Age not entered, assumed to be  48 years old for  purpose of ECG interpretation Sinus tachycardia Right atrial enlargement  Right axis deviation Abnormal R-wave progression, late transition Consider  left ventricular hypertrophy Borderline prolonged QT interval Confirmed by  Zenia Resides  MD, [place] ([id number]) on 08/21/2014 11:06:38 AM      MDM   Final diagnoses:  SOB (shortness of breath)    Patient given IV fluids and started on insulin drip for DKA. Will be admitted to the family practice service. Patient's  tachycardia has improved. His mental status is appropriate.  CRITICAL CARE Performed by: Leota Jacobsen Total critical care time: 45 Critical care time was exclusive of separately billable procedures and treating other patients. Critical care was necessary to treat or prevent imminent or life-threatening deterioration. Critical care was time spent personally by me on the following activities: development of treatment plan with patient and/or surrogate as well as nursing, discussions with consultants, evaluation of patient's response to treatment, examination of patient, obtaining history from patient or surrogate, ordering and performing treatments and interventions, ordering and review of laboratory studies, ordering and review of radiographic studies, pulse oximetry and re-evaluation of patient's condition.     Leota Jacobsen, MD 08/21/14 [phone]

## 2014-08-21 NOTE — H&P (Signed)
[facility] Admission History and Physical Service Pager: [phone]  Patient name: Jeffrey [name] Medical record number: [id number] Date of birth: 02-24-1966 Age: 48 y.o. Gender: male  Primary Care Provider: Howard Pouch, DO Consultants: None Code Status: Full per discussion on admission   Chief Complaint: Nausea/vomiting   Assessment and Plan: GIA HEMP is a 48 y.o. male presenting with nausea and vomiting found to have CBG of 759, and AG of 69, and acidotic. PMH is significant for type 2 diabetes mellitus, OSA, HTN, hyperlipidemia, and h/o bleeding ulcer.   DKA: Afebrile. Bicarb 9 with gap 69 with corrected sodium. VBG with pH 7.1. Contacted patient's [facility] pharmacy who states he last received 1 vial of Lantus and 1 vial of Novolog in August. - Admit to SDU, attending Dr Ree Kida - In the ED, started on insulin drip along with 1L bolus x 2 and NS @ 150cc/hr. Tachycardia improving slightly with fluids. Holding home lantus and SSI. - Will switch to D5-1/2NS once CBG <250 x 2.  - Q1 CBGs - Q2 BMET until gap closed and to follow K (currently 6.5). - Once gap closed, will overlap with home Lantus or slightly less depending on insulin usage (Rx for 30u BID as an outpatient) x 2hrs  - PRN Zofran and Dextrose; zofran spaced to q8h prn given borderline QTc. - Will c/s nutrition for DM management and care management due to lack of insurance and difficulties with medications.   Hematemesis: Presumed with report of bright red emesis, currently brownish-red on exam. Per EMR, patient with a h/o a bleeding ulcer. Given the patient's h/o forceful vomiting, he could have a Mallory Weiss tear. Hemoglobin currently 16, possibly some element of hemoconcentration. - Repeat CBC to monitor Hb. If decreasing quickly, will place large-bore IVs, type/screen, and consult GI, and possibly transfuse based on if symptomatic at that time and hgb level. - Will start Protonix 40mg   IV BID - Will hold off on GI consult currently, will assess once repeat Hgb resulted. - Will hold home ASA - Will check INR - Clarify if any hx consistent with gastroparesis in AM when pt more comfortable and able to give hx.  Hyperkalemia: 6.5. EKG with some possible peaking of T waves, sinus tach, RAD, borderline prolonged QT at 495.  - Monitor with Q2 bmets above. Will likely come down with insulin and may need to be repleted. - Repeat EKG in the AM  Leukocytosis: Likely elevated in setting of DKA; no strong history for infection, afebrile; GI infection unlikely but possible. - CBC in AM - U/A pending from the ED  Foot pain: Most consistent with peripheral neuropathy from elevated CBGs.  - Will start gabapentin and eucerin for dry skin - Continue to monitor   AKI Cr 1.86. BL 1.1. - Fluids per insulin drip protocol. - Follow-up BMET  Tachycardia: Improving with IV fluids. Likely due to dehydration/discomfort - Continue to monitor   Hypertension: BP normal 110/69, though was as high as 155/106. - Holding home Norvasc and losartan; restart once normal PO intake and BP increases - Switching home PO metoprolol to IV metop  Hyperlipidemia: - Holding pravastatin in the setting of inability to tolerate PO  OSA: - CPAP qHS   FEN/GI: NS@ 150cc/hr, NPO, management of hyperkalemia as above, IV Protonix Prophylaxis: No pharmacologic ppx due to bleed, SCDs  Disposition: Admit to SDU, attending Dr. Ree Kida  History of Present Illness: Jeffrey [name] is a 48 y.o. male presenting with  nausea and vomiting x 3 days along with brown/brigh red emesis. CBG by EMS 575. He's present with his wife and daughter, however all are poor historians.    Per the wife, he has had nausea about 1 week with about 3 days of loss of appetite and vomiting. His vomit initially looked like food, then was brown, and as of this AM, is more dark and sometimes red per the patient. He has not been able to eat in 3  days and also endorses stomach pain. Denies diarrhea. He has had severe polydipsia and polyuria for about 1 week. Additionally, per the patient, for the last month he started having tingling and "spikes" in his feet and they feel like they're on fire.   Initially they state he has been out of his medications for one week, however later they state he was out of it for >30days but recently restarted his insulin 1 week ago.  Apparently he lost his insurance which has made it difficult to obtain his medications.  He endorses coughing about 1 week ago without fever/chills. Has endorsed some SOB with cough and 1 week of increased orthopnea (3 pillows). No increased LE edema.    Review Of Systems: Per HPI with the following additions: Normal stools, had a cardiac arrest last year but denies chest pain currently. No hemoptysis. Had an EGD in the past but is not sure why. No dysuria or dyspnea if not vomiting. Otherwise 12 point review of systems was performed and was unremarkable.  Patient Active Problem List   Diagnosis Date Noted  . Diabetic ketoacidosis without coma associated with type 1 diabetes mellitus   . Hematemesis with nausea   . Leukocytosis   . Cough   . Dehydration 05/18/2014  . Hypovolemia dehydration 05/18/2014  . DKA (diabetic ketoacidoses) 01/04/2014  . OSA on CPAP 12/28/2012  . Acute renal insufficiency 12/08/2012  . Pulmonary edema 12/08/2012  . Hypoglycemia 12/07/2012  . Cardiac arrest 12/07/2012  . Diarrhea 12/07/2012  . Obesity 12/07/2012  . Anoxic brain injury 12/07/2012  . Acute respiratory failure 12/06/2012  . GI bleed 11/26/2012  . Venous insufficiency 12/13/2011  . Leg wound, left 12/03/2011  . INSOMNIA, CHRONIC 02/14/2009  . 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 ALCOHOL ABUSE, HX OF 11/10/2007  . ERECTILE DYSFUNCTION 04/14/2007  . DM type 2 goal A1C below 7.5 12/04/2006  . HYPERCHOLESTEROLEMIA 12/04/2006  . OBESITY, NOS 12/04/2006  .  ANXIETY 12/04/2006  . HYPERTENSION, BENIGN SYSTEMIC 12/04/2006  . ARTHRITIS 12/04/2006   Past Medical History: Past Medical History  Diagnosis Date  . Hypertension   . Blood transfusion 2014    "related to bleeding ulcer"  . Venous insufficiency 12/13/2011  . Cardiac arrest 12/07/2012  . Anxiety   . Acute renal insufficiency 12/08/2012  . High cholesterol   . OSA on CPAP 12/07/2012  . Type II diabetes mellitus   . Bleeding ulcer 2014  . GERD (gastroesophageal reflux disease)    Past Surgical History: Past Surgical History  Procedure Laterality Date  . Knee arthroscopy Left ~ 1999  . Esophagogastroduodenoscopy Left 11/26/2012    Procedure: ESOPHAGOGASTRODUODENOSCOPY (EGD);  Surgeon: Wonda Horner, MD;  Location: [facility] ENDOSCOPY;  Service: Endoscopy;  Laterality: Left;  . Cardiovascular stress test  03/16/13    Very Poor Exercise Tolerance; NON DIAGNOSTIC TEST  . Cataract extraction w/ intraocular lens implant Left 03/07/2014    Groat @ [facility]   Social History: History  Substance  Use Topics  . Smoking status: Never Smoker   . Smokeless tobacco: Current User    Types: Snuff  . Alcohol Use: 3.6 oz/week    6 Cans of beer per week    Please also refer to relevant sections of EMR.  Family History: No family history on file. Allergies and Medications: Allergies  Allergen Reactions  . Lisinopril Other (See Comments)    Pt reports nose bleed.   No current facility-administered medications on file prior to encounter.   Current Outpatient Prescriptions on File Prior to Encounter  Medication Sig Dispense Refill  . aspirin EC 81 MG tablet Take 81 mg by mouth daily.    . insulin glargine (LANTUS) 100 UNIT/ML injection Inject 0.3 mLs (30 Units total) into the skin 2 (two) times daily. (Patient taking differently: Inject 70 Units into the skin daily. ) 10 mL 11  . losartan (COZAAR) 100 MG tablet Take 1 tablet (100 mg total) by mouth daily. 30 tablet 0  . metoprolol succinate  (TOPROL-XL) 100 MG 24 hr tablet Take 1 tablet (100 mg total) by mouth daily. Take with or immediately following a meal. 30 tablet 3  . Multiple Vitamin (MULTIVITAMIN WITH MINERALS) TABS tablet Take 1 tablet by mouth daily.    . pantoprazole (PROTONIX) 40 MG tablet Take 1 tablet (40 mg total) by mouth daily. 90 tablet 3  . pravastatin (PRAVACHOL) 80 MG tablet Take 1 tablet (80 mg total) by mouth daily. 30 tablet 3  . traZODone (DESYREL) 100 MG tablet Take 100 mg by mouth at bedtime.    . insulin aspart (NOVOLOG) 100 UNIT/ML injection Inject 2-6 Units into the skin 3 (three) times daily before meals. On sliding scale (Patient taking differently: Inject 2-6 Units into the skin 3 (three) times daily as needed for high blood sugar. On sliding scale) 10 mL 11  . simethicone (MYLICON) 80 MG chewable tablet Chew 1 tablet (80 mg total) by mouth every 6 (six) hours as needed for flatulence. (Patient not taking: Reported on 08/21/2014) 30 tablet 0    Objective: BP 114/75 mmHg  Pulse 119  Temp(Src) 98 F (36.7 C)  Resp 23  SpO2 100% Exam: General: Uncomfortable with dark brown emesis on his gown and mouth, often gagging and vomiting, urging his wife to scratch his feet to help with the pain. Unable to sit still. HEENT: Atraumatic. PERRL. Red lips. Brownish red substance around his mouth. MMM. Poor dentition. Neck supple without LAD.   Cardiovascular: Tachycardic. Regular rhythm. II/VI systolic murmur. No rubs or gallops noted.  Respiratory: No increased WOB (when not vomiting). CTAB without wheezes, rhonchi, or crackles Abdomen: +BS. Poor exam as patient unable to lay supine however soft, non-tender, non-distended when examining sitting up. Extremities: Compression stockings on. No pitting edema noted Skin: Dry, scaly. Eschar over the left lateral leg Neuro: A&O but distracted. Speech clear and coherent. No gross focal deficits.   Labs and Imaging: CBC BMET   Recent Labs Lab 08/21/14 1100  WBC  18.0*  HGB 16.0  HCT 49.5  PLT 258    Recent Labs Lab 08/21/14 1100  NA 135*  K 6.5*  CL 70*  CO2 9*  BUN 31*  CREATININE 1.86*  GLUCOSE 759*  CALCIUM 10.7*    VBG: pH: 7.136, pCO2: 33.1, pO2: 35.0, bicarb 11.2, acid-base deficit 17.0  EKG 11/15: Sinus tachycardia. HR 115. T wave inversion in avR. Odd appearing ST progression however no blatant elevation or depression. Possible T wave peaking. QTc 495  DG chest 2 view: No active cardiopulmonary disease.   Archie Patten, MD 08/21/2014, 3:33 PM PGY-1, [place] Intern pager: [phone], text pages welcome  I have seen and examined the patient with Dr Lorenso Courier and agree with assessment and plan with my additions in blue. Hilton Sinclair, MD PGY-3, [place]

## 2014-08-21 NOTE — ED Notes (Signed)
Admitting MD aware that pt is finishing 2nd NS bolus prior to insulin drip starting

## 2014-08-21 NOTE — Progress Notes (Signed)
Spoke to [place], MD about pt's HR sustaining in 120s. Pt alert, oriented and other vitals are stable. No new orders given at this time. Says she will wait to see what CBC results are.

## 2014-08-22 ENCOUNTER — Inpatient Hospital Stay (HOSPITAL_COMMUNITY): Payer: Self-pay

## 2014-08-22 LAB — BLOOD GAS, ARTERIAL
ACID-BASE EXCESS: 5.7 mmol/L — AB (ref 0.0–2.0)
Bicarbonate: 29.4 mEq/L — ABNORMAL HIGH (ref 20.0–24.0)
DRAWN BY: 24610
FIO2: 0.21 %
O2 SAT: 96.6 %
PATIENT TEMPERATURE: 98.6
TCO2: 30.7 mmol/L (ref 0–100)
pCO2 arterial: 41.1 mmHg (ref 35.0–45.0)
pH, Arterial: 7.469 — ABNORMAL HIGH (ref 7.350–7.450)
pO2, Arterial: 81.2 mmHg (ref 80.0–100.0)

## 2014-08-22 LAB — BASIC METABOLIC PANEL
ANION GAP: 21 — AB (ref 5–15)
ANION GAP: 16 — AB (ref 5–15)
Anion gap: 26 — ABNORMAL HIGH (ref 5–15)
Anion gap: 29 — ABNORMAL HIGH (ref 5–15)
Anion gap: 20 — ABNORMAL HIGH (ref 5–15)
Anion gap: 21 — ABNORMAL HIGH (ref 5–15)
Anion gap: 19 — ABNORMAL HIGH (ref 5–15)
BUN: 43 mg/dL — AB (ref 6–23)
BUN: 43 mg/dL — AB (ref 6–23)
BUN: 44 mg/dL — AB (ref 6–23)
BUN: 44 mg/dL — AB (ref 6–23)
BUN: 42 mg/dL — AB (ref 6–23)
BUN: 41 mg/dL — ABNORMAL HIGH (ref 6–23)
BUN: 36 mg/dL — ABNORMAL HIGH (ref 6–23)
CALCIUM: 9.5 mg/dL (ref 8.4–10.5)
CALCIUM: 8.9 mg/dL (ref 8.4–10.5)
CALCIUM: 9.2 mg/dL (ref 8.4–10.5)
CHLORIDE: 95 meq/L — AB (ref 96–112)
CHLORIDE: 97 meq/L (ref 96–112)
CO2: 24 mEq/L (ref 19–32)
CO2: 24 meq/L (ref 19–32)
CO2: 27 mEq/L (ref 19–32)
CO2: 27 mEq/L (ref 19–32)
CO2: 29 mEq/L (ref 19–32)
CO2: 24 mEq/L (ref 19–32)
CO2: 29 mEq/L (ref 19–32)
CREATININE: 1.64 mg/dL — AB (ref 0.50–1.35)
CREATININE: 1.59 mg/dL — AB (ref 0.50–1.35)
Calcium: 9.6 mg/dL (ref 8.4–10.5)
Calcium: 9.6 mg/dL (ref 8.4–10.5)
Calcium: 9.5 mg/dL (ref 8.4–10.5)
Calcium: 9.5 mg/dL (ref 8.4–10.5)
Chloride: 95 mEq/L — ABNORMAL LOW (ref 96–112)
Chloride: 99 mEq/L (ref 96–112)
Chloride: 97 mEq/L (ref 96–112)
Chloride: 97 mEq/L (ref 96–112)
Chloride: 99 mEq/L (ref 96–112)
Creatinine, Ser: 1.83 mg/dL — ABNORMAL HIGH (ref 0.50–1.35)
Creatinine, Ser: 1.95 mg/dL — ABNORMAL HIGH (ref 0.50–1.35)
Creatinine, Ser: 1.61 mg/dL — ABNORMAL HIGH (ref 0.50–1.35)
Creatinine, Ser: 1.37 mg/dL — ABNORMAL HIGH (ref 0.50–1.35)
Creatinine, Ser: 1.35 mg/dL (ref 0.50–1.35)
GFR calc Af Amer: 44 mL/min — ABNORMAL LOW (ref >90)
GFR calc Af Amer: 48 mL/min — ABNORMAL LOW (ref >90)
GFR calc Af Amer: 55 mL/min — ABNORMAL LOW (ref >90)
GFR calc Af Amer: 57 mL/min — ABNORMAL LOW (ref >90)
GFR calc non Af Amer: 38 mL/min — ABNORMAL LOW (ref >90)
GFR calc non Af Amer: 41 mL/min — ABNORMAL LOW (ref >90)
GFR calc non Af Amer: 47 mL/min — ABNORMAL LOW (ref >90)
GFR calc non Af Amer: 59 mL/min — ABNORMAL LOW (ref >90)
GFR calc non Af Amer: 60 mL/min — ABNORMAL LOW (ref >90)
GFR, EST AFRICAN AMERICAN: 56 mL/min — AB (ref >90)
GFR, EST AFRICAN AMERICAN: 68 mL/min — AB (ref >90)
GFR, EST AFRICAN AMERICAN: 69 mL/min — AB (ref >90)
GFR, EST NON AFRICAN AMERICAN: 48 mL/min — AB (ref >90)
GFR, EST NON AFRICAN AMERICAN: 49 mL/min — AB (ref >90)
GLUCOSE: 189 mg/dL — AB (ref 70–99)
GLUCOSE: 243 mg/dL — AB (ref 70–99)
GLUCOSE: 123 mg/dL — AB (ref 70–99)
GLUCOSE: 232 mg/dL — AB (ref 70–99)
Glucose, Bld: 127 mg/dL — ABNORMAL HIGH (ref 70–99)
Glucose, Bld: 141 mg/dL — ABNORMAL HIGH (ref 70–99)
Glucose, Bld: 158 mg/dL — ABNORMAL HIGH (ref 70–99)
POTASSIUM: 4.6 meq/L (ref 3.7–5.3)
POTASSIUM: 4.8 meq/L (ref 3.7–5.3)
POTASSIUM: 4.8 meq/L (ref 3.7–5.3)
POTASSIUM: 4.4 meq/L (ref 3.7–5.3)
Potassium: 5 mEq/L (ref 3.7–5.3)
Potassium: 4.9 mEq/L (ref 3.7–5.3)
Potassium: 4.7 mEq/L (ref 3.7–5.3)
SODIUM: 145 meq/L (ref 137–147)
SODIUM: 148 meq/L — AB (ref 137–147)
SODIUM: 146 meq/L (ref 137–147)
SODIUM: 142 meq/L (ref 137–147)
Sodium: 145 mEq/L (ref 137–147)
Sodium: 145 mEq/L (ref 137–147)
Sodium: 144 mEq/L (ref 137–147)

## 2014-08-22 LAB — URINALYSIS, ROUTINE W REFLEX MICROSCOPIC
Glucose, UA: >1000 mg/dL — AB
Ketones, ur: 40 mg/dL — AB
LEUKOCYTES UA: NEGATIVE
Nitrite: NEGATIVE
PROTEIN: 100 mg/dL — AB
Specific Gravity, Urine: 1.029 (ref 1.005–1.030)
Urobilinogen, UA: 1 mg/dL (ref 0.0–1.0)
pH: 5 (ref 5.0–8.0)

## 2014-08-22 LAB — CBC
HCT: 44.3 % (ref 39.0–52.0)
HEMATOCRIT: 43.5 % (ref 39.0–52.0)
HEMOGLOBIN: 14.9 g/dL (ref 13.0–17.0)
HEMOGLOBIN: 14.8 g/dL (ref 13.0–17.0)
MCH: 34.3 pg — ABNORMAL HIGH (ref 26.0–34.0)
MCH: 34.6 pg — AB (ref 26.0–34.0)
MCHC: 34 g/dL (ref 30.0–36.0)
MCHC: 33.6 g/dL (ref 30.0–36.0)
MCV: 101.8 fL — AB (ref 78.0–100.0)
MCV: 101.6 fL — AB (ref 78.0–100.0)
Platelets: 241 10*3/uL (ref 150–400)
Platelets: 234 10*3/uL (ref 150–400)
RBC: 4.28 MIL/uL (ref 4.22–5.81)
RBC: 4.35 MIL/uL (ref 4.22–5.81)
RDW: 14.3 % (ref 11.5–15.5)
RDW: 14.3 % (ref 11.5–15.5)
WBC: 23.3 10*3/uL — ABNORMAL HIGH (ref 4.0–10.5)
WBC: 23.9 10*3/uL — AB (ref 4.0–10.5)

## 2014-08-22 LAB — GLUCOSE, CAPILLARY
GLUCOSE-CAPILLARY: 168 mg/dL — AB (ref 70–99)
GLUCOSE-CAPILLARY: 134 mg/dL — AB (ref 70–99)
GLUCOSE-CAPILLARY: 123 mg/dL — AB (ref 70–99)
GLUCOSE-CAPILLARY: 159 mg/dL — AB (ref 70–99)
GLUCOSE-CAPILLARY: 165 mg/dL — AB (ref 70–99)
GLUCOSE-CAPILLARY: 125 mg/dL — AB (ref 70–99)
GLUCOSE-CAPILLARY: 150 mg/dL — AB (ref 70–99)
GLUCOSE-CAPILLARY: 201 mg/dL — AB (ref 70–99)
Glucose-Capillary: 100 mg/dL — ABNORMAL HIGH (ref 70–99)
Glucose-Capillary: 110 mg/dL — ABNORMAL HIGH (ref 70–99)
Glucose-Capillary: 129 mg/dL — ABNORMAL HIGH (ref 70–99)
Glucose-Capillary: 150 mg/dL — ABNORMAL HIGH (ref 70–99)
Glucose-Capillary: 153 mg/dL — ABNORMAL HIGH (ref 70–99)
Glucose-Capillary: 155 mg/dL — ABNORMAL HIGH (ref 70–99)
Glucose-Capillary: 162 mg/dL — ABNORMAL HIGH (ref 70–99)
Glucose-Capillary: 171 mg/dL — ABNORMAL HIGH (ref 70–99)
Glucose-Capillary: 172 mg/dL — ABNORMAL HIGH (ref 70–99)
Glucose-Capillary: 189 mg/dL — ABNORMAL HIGH (ref 70–99)
Glucose-Capillary: 202 mg/dL — ABNORMAL HIGH (ref 70–99)
Glucose-Capillary: 208 mg/dL — ABNORMAL HIGH (ref 70–99)
Glucose-Capillary: 232 mg/dL — ABNORMAL HIGH (ref 70–99)
Glucose-Capillary: 246 mg/dL — ABNORMAL HIGH (ref 70–99)

## 2014-08-22 LAB — URINE MICROSCOPIC-ADD ON

## 2014-08-22 MED ORDER — HYDRALAZINE HCL 20 MG/ML IJ SOLN: 2.0000 mg | Freq: Four times a day (QID) | INTRAMUSCULAR | Status: DC | PRN

## 2014-08-22 MED ORDER — CHLORHEXIDINE GLUCONATE 0.12 % MT SOLN
15.0000 mL | Freq: Two times a day (BID) | OROMUCOSAL | Status: DC
  Administered 2014-08-22 – 2014-08-25 (×4): 15 mL via OROMUCOSAL
  Filled 2014-08-22 (×8): qty 15

## 2014-08-22 MED ORDER — METOPROLOL TARTRATE 1 MG/ML IV SOLN
10.0000 mg | Freq: Two times a day (BID) | INTRAVENOUS | Status: DC
  Administered 2014-08-22 – 2014-08-23 (×2): 10 mg via INTRAVENOUS
  Filled 2014-08-22 (×2): qty 10

## 2014-08-22 MED ORDER — SODIUM CHLORIDE 0.9 % IV SOLN
INTRAVENOUS | Status: DC
  Filled 2014-08-22 (×2): qty 1000

## 2014-08-22 MED ORDER — LORAZEPAM 1 MG PO TABS
1.0000 mg | ORAL_TABLET | ORAL | Status: DC | PRN
  Administered 2014-08-24 – 2014-08-25 (×3): 1 mg via ORAL
  Filled 2014-08-22 (×3): qty 1

## 2014-08-22 MED ORDER — LORAZEPAM 2 MG/ML IJ SOLN
1.0000 mg | INTRAMUSCULAR | Status: DC | PRN
  Administered 2014-08-23: 1 mg via INTRAVENOUS
  Filled 2014-08-22: qty 1

## 2014-08-22 MED ORDER — POTASSIUM CHLORIDE 2 MEQ/ML IV SOLN
INTRAVENOUS | Status: DC
  Administered 2014-08-22 – 2014-08-23 (×5): via INTRAVENOUS
  Filled 2014-08-22 (×9): qty 1000

## 2014-08-22 MED ORDER — HYDRALAZINE HCL 20 MG/ML IJ SOLN
10.0000 mg | Freq: Four times a day (QID) | INTRAMUSCULAR | Status: DC | PRN
  Administered 2014-08-22 – 2014-08-24 (×2): 10 mg via INTRAVENOUS
  Filled 2014-08-22 (×2): qty 1

## 2014-08-22 MED ORDER — CETYLPYRIDINIUM CHLORIDE 0.05 % MT LIQD
7.0000 mL | Freq: Two times a day (BID) | OROMUCOSAL | Status: DC
  Administered 2014-08-23 – 2014-08-24 (×3): 7 mL via OROMUCOSAL

## 2014-08-22 NOTE — Progress Notes (Addendum)
Inpatient Diabetes Program Recommendations  AACE/ADA: New Consensus Statement on Inpatient Glycemic Control (2013)  Target Ranges:  Prepandial:   less than 140 mg/dL      Peak postprandial:   less than 180 mg/dL (1-2 hours)      Critically ill patients:  140 - 180 mg/dL     Results for Jeffrey [name], Jeffrey [name] (MRN [id number]) as of 08/22/2014 14:27  Ref. Range 08/21/2014 11:00  Sodium Latest Range: 137-147 mEq/L 135 (L)  Potassium Latest Range: 3.7-5.3 mEq/L 6.5 (HH)  Chloride Latest Range: 96-112 mEq/L 70 (L)  CO2 Latest Range: 19-32 mEq/L 9 (LL)  BUN Latest Range: 6-23 mg/dL 31 (H)  Creatinine Latest Range: 0.50-1.35 mg/dL 1.86 (H)  Calcium Latest Range: 8.4-10.5 mg/dL 10.7 (H)  GFR calc non Af Amer Latest Range: >90 mL/min 41 (L)  GFR calc Af Amer Latest Range: >90 mL/min 47 (L)  Glucose Latest Range: 70-99 mg/dL 759 (HH)  Anion gap Latest Range: 5-15  56 (H)    Admitted with DKA.  Has not been taking medications for >30 days b/c patient lost his health insurance.  Home DM meds (not taking): Lantus 70 units daily      Novolog 2-6 units tid per SSI   Patient remains on IV insulin drip per Gluc[employer]Stabilizer.  CO2 up to 29 on 10am BMET today.  If patient will not have health insurance coverage at time of d/c, will need affordable insulin for home.  70/30 insulin is a more affordable option for patients without health insurance.  70/30 insulin can be purchased for $25 per vial at [facility] with a prescription.  Will see patient tomorrow once he is more awake.    MD- When patient ready to transition off IV insulin drip, may want to consider converting to 70/30 insulin for more affordability as outpatient medication.    Could start with 70/30 insulin- 30 units bid with meals- Would give 1st dose 70/30 insulin 1 hour before IV insulin drip stopped  (This would be equivalent to ~0.4 units/kg basal dosing)    Will follow [name] RN, MSN, CDE Diabetes  Coordinator Inpatient Diabetes Program Team Pager: [phone] (8a-10p)

## 2014-08-22 NOTE — Progress Notes (Signed)
Pt states that he is does not want to wear CPAP at this time. He stated he would call if he changed his mind. Rt will monitor.

## 2014-08-22 NOTE — Progress Notes (Signed)
Nutrition Consult  RD consulted for nutrition education regarding diabetes.   Lab Results  Component Value Date   HGBA1C 9.0* 05/18/2014    RD provided "Carbohydrate Counting for People with Diabetes" handout from the Academy of Nutrition and Dietetics. Patient's eyes remained closed during RD visit. He seemed a little confused. RN aware.  Body mass index is 34.13 kg/(m^2). Pt meets criteria for class 1 obesity based on current BMI.  Current diet order is NPO. Labs and medications reviewed. Not appropriate for diet education at this time. RD contact information provided. If additional nutrition issues arise, please re-consult RD. Recommend OP diabetes diet education.   Molli Barrows, RD, LDN, [place] Pager [phone] After Hours Pager [phone]

## 2014-08-22 NOTE — Progress Notes (Signed)
Nursing Pt found on side of bed on knees unwitnessed.  Pt able to get back to bed.  Denies injury.  FPTS notified.

## 2014-08-22 NOTE — Progress Notes (Signed)
Utilization review completed.  

## 2014-08-22 NOTE — Care Management Note (Addendum)
    Page 1 of 2   08/25/2014     2:56:45 PM CARE MANAGEMENT NOTE 08/25/2014  Patient:  Jeffrey [name], Jeffrey [name]   Account Number:  [id number]  Date Initiated:  08/22/2014  Documentation initiated by:  GRAVES-BIGELOW,BRENDA  Subjective/Objective Assessment:   Pt admitted for DKA. Pt without insurance. Pt follows up in the outpatient clinic. MD is aware to have pt speak to [facility] in office in ref to orange card assistance.     Action/Plan:   CM did go by to speak with pt and he was asleep. Pt was given the [place] in August and will not be eligible to use. CM will not be able to assist with medications at this time.   Anticipated DC Date:  08/25/2014   Anticipated DC Plan:  [place]  CM consult  Patient refused services      Choice offered to / List presented to:             Status of service:  Completed, signed off Medicare Important Message given?  NO (If response is "NO", the following Medicare IM given date fields will be blank) Date Medicare IM given:   Medicare IM given by:   Date Additional Medicare IM given:   Additional Medicare IM given by:    Discharge Disposition:  HOME/SELF CARE  Per UR Regulation:  Reviewed for med. necessity/level of care/duration of stay  If discussed at [place] of Stay Meetings, dates discussed:    Comments:  08/25/14- 1000- Marvetta Gibbons RN, BSN [phone] Pt has decided he now wants cane for discharge- have called [facility]- and they will bring cane to room prior to discharge- pt [facility] and speak with Pamala Hurry regarding orange card for future medication assistance through the Health Dept. for discharge- pt is to go to [facility] for his meds at discharge.  08-24-14 [address], [place] [phone] CM did speak with pt in regards to [facility] services and pt is refusing services for PT at this time. Pt feels like he was little deconditioned and had been in bed too long. He states he  will not need any therapy. No further needs from CM at this time.   08/23/2014 1200 NCM spoke to pt and states he is feeling quite down right now due to his financial situation. He is unable to pay his bills. Gave permission to speak to his wife, Sunday Spillers. Pt will to follow up at [facility] until he has completed the application process with Family Medicine. [name] RN CCM Case Mgmt phone [phone]  08/23/2014 1130 NCM spoke to pt and gave info on [facility] and [facility]. Explained to pt appt will be arranged post dc for follow up and he can pick up his meds at [facility] at discounted rate. Spoke to [facility] pharmacist and they have Lantus Solastar Pens and Toujeo samples in the clinic. Pt can receive for free. Pt not eligible for [facility] program. NCM contacted Family Medicine and spoke to [place]. Pt has not completed financial application to receive assistance with seeing PCP and getting meds. He will have to complete application, once he has completed pt can follow up with PCP and get his meds from Health Dept. [name] RN CCM Case Mgmt phone [phone]

## 2014-08-22 NOTE — Progress Notes (Signed)
Family Medicine Teaching Service Daily Progress Note Intern Pager: [phone]  Patient name: Jeffrey [name] Medical record number: [id number] Date of birth: 01-01-1966 Age: 48 y.o. Gender: male  Primary Care Provider: Howard Pouch, DO Consultants: None Code Status: Full  Pt Overview and Major Events to Date:  11/15:  Admitted for DKA with a corrected AG of 69 and a venous pH of 7.1. Also endorsing hematemesis.  Assessment and Plan: Jeffrey [name] is a 48 y.o. male presenting with nausea and vomiting found to have CBG of 759, and AG of 69, and acidotic. PMH is significant for type 2 diabetes mellitus, OSA, HTN, hyperlipidemia, and h/o bleeding ulcer.   DKA: Afebrile. Bicarb 9 with gap 69 with corrected sodium. VBG with pH 7.1. Contacted patient's [facility] who states he last received 1 vial of Lantus and 1 vial of Novolog in August.  In the ED, started on insulin drip (which didn't get started until he was on the floor) along with 1L bolus x 2 and NS @ 150cc/hr. Tachycardia persistent despite IVFs.  - Holding home lantus and SSI. - Will switch to D5-1/2NS once CBG <250 x 2.  - Q1 CBGs - Q2 BMET until gap closed (curently 20)  and to follow K (currently 4.8). - Once gap closed, will overlap with home Lantus or slightly less depending on insulin usage (Rx for 30u BID as an outpatient) x 2hrs  - PRN Zofran and Dextrose; zofran spaced to q8h prn given borderline QTc. - Will c/s nutrition for DM management and care management due to lack of insurance and difficulties with medications.   Hematemesis: Presumed with report of bright red emesis, currently brownish-red on exam. Per EMR, patient with a h/o a bleeding ulcer. Given the patient's h/o forceful vomiting, he could have a Mallory Weiss tear. Hemoglobin on admission was 16, possibly with some element of hemoconcentration. Currently stable at 14.9. INR stable at 1.25 - Continue to monitor CBC daily - If Hgb decreasing quickly,  will place large-bore IVs, type/screen, and consult GI, and possibly transfuse based on if symptomatic at that time and hgb level. - Continue Protonix 40mg  IV BID - Will hold home ASA - Clarify if any hx consistent with gastroparesis when patient more alert  Hyperkalemia: 6.5. EKG with some possible peaking of T waves, sinus tach, RAD, borderline prolonged QT at 495.  - Monitor with Q2 bmets above. Improving with insulin and may need to be repleted in the future - Repeat EKG with some improvement in peaked T waves.   Leukocytosis: WBC 18 on admission. Likely elevated in setting of DKA; no strong history for infection, afebrile; GI infection unlikely but possible. Leukocytosis continues to increase. - Consider infectious work-up if WBC continues to increase or patient becomes febrile. - CBC in AM - U/A pending from the ED - Will get a CXR given pt is a high aspiration risk given h/o alcohol abuse and emesis.  Altered mental status: Patient very somnolent but arousable on exam. Unable to answers correctly and not oriented to person, place, or time. He is able to follow commands. Patient was moving his head/neck comfortably and freely yesterday with emesis, therefore CNS infection less concerning.  - No known trauma, however unable to get a good history  - U/A pending - CT head pending   History of alcohol abuse: Unable to assess how often patient drinks as he's very somnolent on exam  - CIWA scores 4-11 - Has required Ativan 1mg  x 2  Foot pain: Most consistent with peripheral neuropathy from elevated CBGs.  - Will start gabapentin and eucerin for dry skin - Continue to monitor   AKI Cr 1.86 on admission. Baseline appears to be 1.1. - Some improvement to 1.61 - Fluids per insulin drip protocol. - Follow-up BMET  Tachycardia: Improving with IV fluids. Likely due to dehydration/discomfort - Continue to monitor   Hypertension: BP slightly elevated to 148/95. - Holding home Norvasc  and losartan; restart once normal PO intake  - Switched home PO metoprolol to IV metop while NPO  Hyperlipidemia: - Holding pravastatin in the setting of inability to tolerate PO  OSA: - CPAP qHS   FEN/GI: NS@ 150cc/hr, NPO, IV Protonix Prophylaxis: No pharmacologic ppx due to bleed, SCDs  Disposition: SDU with CIWA   Subjective:  Patient unable to answers questions appropriately this AM. No family in the room  Objective: Temp:  [97.6 F (36.4 C)-98.1 F (36.7 C)] 98 F (36.7 C) (11/16 0506) Pulse Rate:  [117-126] 118 (11/16 0506) Resp:  [20-34] 21 (11/16 0506) BP: (104-155)/(51-106) 148/95 mmHg (11/16 0506) SpO2:  [94 %-100 %] 96 % (11/16 0506) Weight:  [237 lb 14 oz (107.9 kg)] 237 lb 14 oz (107.9 kg) (11/15 1511) Physical Exam: General: Lying in bed in NAD, malodorous Cardiovascular: Tachycardic. II/VI systolic murmur. No rubs or gallops noted Respiratory: Difficult to assess, however sounds CTAB without wheezing, rhonchi, or crackles Abdomen: +BS, soft, NT/ND Extremities: No swelling noted   Laboratory:  Recent Labs Lab 08/21/14 1854 08/22/14 0133 08/22/14 0220  WBC 21.2* 23.3* 23.9*  HGB 13.9 14.8 14.9  HCT 42.2 43.5 44.3  PLT 235 241 234    Recent Labs Lab 08/22/14 0133 08/22/14 0220 08/22/14 0600  NA 148* 145 146  K 4.6 5.0 4.8  CL 95* 95* 99  CO2 24 24 27   BUN 43* 43* 44*  CREATININE 1.95* 1.83* 1.61*  CALCIUM 9.6 9.6 9.5  GLUCOSE 243* 189* 127*    VBG: pH: 7.136, pCO2: 33.1, pO2: 35.0, bicarb 11.2, acid-base deficit 17.0  EKG 11/15: Sinus tachycardia. HR 115. T wave inversion in avR. Odd appearing ST progression however no blatant elevation or depression. Possible T wave peaking. QTc 495  DG chest 2 view: No active cardiopulmonary disease.   Archie Patten, MD 08/22/2014, 7:11 AM PGY-1, [place] Intern pager: [phone], text pages welcome

## 2014-08-22 NOTE — Plan of Care (Signed)
Problem: Phase I Progression Outcomes Goal: Diabetes Coordinator Consult Outcome: Completed/Met Date Met:  08/22/14     

## 2014-08-22 NOTE — Progress Notes (Addendum)
Patient had home medications at bedside. Home medication list filled out, medications were counted, and signed by patient and RN. Medications and sheet walked to pharmacy.  Patient had a bottle of Bayer 81 mg aspirin (49 pills), Metoprolol ER 100 mg tab (24 pills), a multivitamin- almost full bottle, not counted.  Pt requesting to keep wallet at bedside. Advised on policy, verbalizes understanding.

## 2014-08-23 DIAGNOSIS — J189 Pneumonia, unspecified organism: Secondary | ICD-10-CM | POA: Insufficient documentation

## 2014-08-23 LAB — GLUCOSE, CAPILLARY
GLUCOSE-CAPILLARY: 125 mg/dL — AB (ref 70–99)
GLUCOSE-CAPILLARY: 127 mg/dL — AB (ref 70–99)
GLUCOSE-CAPILLARY: 128 mg/dL — AB (ref 70–99)
GLUCOSE-CAPILLARY: 190 mg/dL — AB (ref 70–99)
GLUCOSE-CAPILLARY: 203 mg/dL — AB (ref 70–99)
GLUCOSE-CAPILLARY: 238 mg/dL — AB (ref 70–99)
GLUCOSE-CAPILLARY: 96 mg/dL (ref 70–99)
Glucose-Capillary: 143 mg/dL — ABNORMAL HIGH (ref 70–99)
Glucose-Capillary: 140 mg/dL — ABNORMAL HIGH (ref 70–99)
Glucose-Capillary: 109 mg/dL — ABNORMAL HIGH (ref 70–99)
Glucose-Capillary: 142 mg/dL — ABNORMAL HIGH (ref 70–99)
Glucose-Capillary: 158 mg/dL — ABNORMAL HIGH (ref 70–99)
Glucose-Capillary: 168 mg/dL — ABNORMAL HIGH (ref 70–99)
Glucose-Capillary: 180 mg/dL — ABNORMAL HIGH (ref 70–99)
Glucose-Capillary: 189 mg/dL — ABNORMAL HIGH (ref 70–99)
Glucose-Capillary: 239 mg/dL — ABNORMAL HIGH (ref 70–99)
Glucose-Capillary: 222 mg/dL — ABNORMAL HIGH (ref 70–99)
Glucose-Capillary: 237 mg/dL — ABNORMAL HIGH (ref 70–99)
Glucose-Capillary: 247 mg/dL — ABNORMAL HIGH (ref 70–99)

## 2014-08-23 LAB — BASIC METABOLIC PANEL
ANION GAP: 14 (ref 5–15)
ANION GAP: 13 (ref 5–15)
Anion gap: 13 (ref 5–15)
Anion gap: 13 (ref 5–15)
Anion gap: 14 (ref 5–15)
BUN: 17 mg/dL (ref 6–23)
BUN: 24 mg/dL — ABNORMAL HIGH (ref 6–23)
BUN: 26 mg/dL — ABNORMAL HIGH (ref 6–23)
BUN: 27 mg/dL — AB (ref 6–23)
BUN: 32 mg/dL — ABNORMAL HIGH (ref 6–23)
CALCIUM: 8.8 mg/dL (ref 8.4–10.5)
CHLORIDE: 103 meq/L (ref 96–112)
CHLORIDE: 99 meq/L (ref 96–112)
CO2: 25 mEq/L (ref 19–32)
CO2: 25 mEq/L (ref 19–32)
CO2: 27 mEq/L (ref 19–32)
CO2: 28 mEq/L (ref 19–32)
CO2: 30 mEq/L (ref 19–32)
CREATININE: 1.03 mg/dL (ref 0.50–1.35)
CREATININE: 0.89 mg/dL (ref 0.50–1.35)
Calcium: 8.1 mg/dL — ABNORMAL LOW (ref 8.4–10.5)
Calcium: 8.6 mg/dL (ref 8.4–10.5)
Calcium: 8.9 mg/dL (ref 8.4–10.5)
Calcium: 9.2 mg/dL (ref 8.4–10.5)
Chloride: 100 mEq/L (ref 96–112)
Chloride: 101 mEq/L (ref 96–112)
Chloride: 102 mEq/L (ref 96–112)
Creatinine, Ser: 0.98 mg/dL (ref 0.50–1.35)
Creatinine, Ser: 1.11 mg/dL (ref 0.50–1.35)
Creatinine, Ser: 1.25 mg/dL (ref 0.50–1.35)
GFR calc Af Amer: 88 mL/min — ABNORMAL LOW (ref >90)
GFR calc Af Amer: >90 mL/min (ref >90)
GFR calc Af Amer: >90 mL/min (ref >90)
GFR calc non Af Amer: 76 mL/min — ABNORMAL LOW (ref >90)
GFR calc non Af Amer: >90 mL/min (ref >90)
GFR, EST AFRICAN AMERICAN: 76 mL/min — AB (ref >90)
GFR, EST NON AFRICAN AMERICAN: 66 mL/min — AB (ref >90)
GFR, EST NON AFRICAN AMERICAN: 83 mL/min — AB (ref >90)
GLUCOSE: 136 mg/dL — AB (ref 70–99)
Glucose, Bld: 203 mg/dL — ABNORMAL HIGH (ref 70–99)
Glucose, Bld: 190 mg/dL — ABNORMAL HIGH (ref 70–99)
Glucose, Bld: 90 mg/dL (ref 70–99)
Glucose, Bld: 285 mg/dL — ABNORMAL HIGH (ref 70–99)
POTASSIUM: 4.2 meq/L (ref 3.7–5.3)
POTASSIUM: 4.4 meq/L (ref 3.7–5.3)
POTASSIUM: 4.5 meq/L (ref 3.7–5.3)
Potassium: 4 mEq/L (ref 3.7–5.3)
Potassium: 4.9 mEq/L (ref 3.7–5.3)
SODIUM: 143 meq/L (ref 137–147)
Sodium: 143 mEq/L (ref 137–147)
Sodium: 142 mEq/L (ref 137–147)
Sodium: 142 mEq/L (ref 137–147)
Sodium: 137 mEq/L (ref 137–147)

## 2014-08-23 LAB — CBC
HCT: 41 % (ref 39.0–52.0)
Hemoglobin: 13.7 g/dL (ref 13.0–17.0)
MCH: 33.3 pg (ref 26.0–34.0)
MCHC: 33.4 g/dL (ref 30.0–36.0)
MCV: 99.5 fL (ref 78.0–100.0)
PLATELETS: 183 10*3/uL (ref 150–400)
RBC: 4.12 MIL/uL — ABNORMAL LOW (ref 4.22–5.81)
RDW: 14.6 % (ref 11.5–15.5)
WBC: 11.9 10*3/uL — ABNORMAL HIGH (ref 4.0–10.5)

## 2014-08-23 LAB — HEPATITIS C ANTIBODY: HCV AB: NEGATIVE

## 2014-08-23 LAB — HIV ANTIBODY (ROUTINE TESTING W REFLEX): HIV 1&2 Ab, 4th Generation: NONREACTIVE

## 2014-08-23 MED ORDER — SODIUM CHLORIDE 0.9 % IV SOLN
INTRAVENOUS | Status: DC
  Administered 2014-08-23 – 2014-08-24 (×3): via INTRAVENOUS

## 2014-08-23 MED ORDER — INSULIN GLARGINE 100 UNIT/ML ~~LOC~~ SOLN: 40.0000 [IU] | Freq: Every day | SUBCUTANEOUS | Status: DC

## 2014-08-23 MED ORDER — INSULIN GLARGINE 100 UNIT/ML ~~LOC~~ SOLN: 60.0000 [IU] | Freq: Every day | SUBCUTANEOUS | Status: DC

## 2014-08-23 MED ORDER — CLINDAMYCIN HCL 300 MG PO CAPS
300.0000 mg | ORAL_CAPSULE | Freq: Four times a day (QID) | ORAL | Status: DC
  Administered 2014-08-23 – 2014-08-25 (×9): 300 mg via ORAL
  Filled 2014-08-23 (×12): qty 1

## 2014-08-23 MED ORDER — INSULIN GLARGINE 100 UNIT/ML ~~LOC~~ SOLN
40.0000 [IU] | Freq: Every day | SUBCUTANEOUS | Status: DC
  Administered 2014-08-23 – 2014-08-25 (×3): 40 [IU] via SUBCUTANEOUS
  Filled 2014-08-23 (×3): qty 0.4

## 2014-08-23 MED ORDER — AZITHROMYCIN 500 MG PO TABS
500.0000 mg | ORAL_TABLET | Freq: Once | ORAL | Status: AC
  Administered 2014-08-23: 500 mg via ORAL
  Filled 2014-08-23: qty 1

## 2014-08-23 MED ORDER — LOSARTAN POTASSIUM 50 MG PO TABS
100.0000 mg | ORAL_TABLET | Freq: Every day | ORAL | Status: DC
  Administered 2014-08-23 – 2014-08-25 (×3): 100 mg via ORAL
  Filled 2014-08-23 (×3): qty 2

## 2014-08-23 MED ORDER — AMLODIPINE BESYLATE 10 MG PO TABS
10.0000 mg | ORAL_TABLET | Freq: Every day | ORAL | Status: DC
  Administered 2014-08-23 – 2014-08-25 (×3): 10 mg via ORAL
  Filled 2014-08-23 (×3): qty 1

## 2014-08-23 MED ORDER — OXYCODONE HCL 5 MG PO TABS: 5.0000 mg | ORAL_TABLET | Freq: Four times a day (QID) | ORAL | Status: DC | PRN

## 2014-08-23 MED ORDER — PRAVASTATIN SODIUM 80 MG PO TABS
80.0000 mg | ORAL_TABLET | Freq: Every day | ORAL | Status: DC
  Administered 2014-08-23 – 2014-08-24 (×2): 80 mg via ORAL
  Filled 2014-08-23 (×3): qty 1

## 2014-08-23 MED ORDER — MUPIROCIN 2 % EX OINT
1.0000 "application " | TOPICAL_OINTMENT | Freq: Two times a day (BID) | CUTANEOUS | Status: DC
  Administered 2014-08-23 – 2014-08-25 (×4): 1 via NASAL
  Filled 2014-08-23: qty 22

## 2014-08-23 MED ORDER — METOPROLOL SUCCINATE ER 100 MG PO TB24
100.0000 mg | ORAL_TABLET | Freq: Every day | ORAL | Status: DC
  Administered 2014-08-24 – 2014-08-25 (×2): 100 mg via ORAL
  Filled 2014-08-23 (×2): qty 1

## 2014-08-23 MED ORDER — ACETAMINOPHEN 325 MG PO TABS
650.0000 mg | ORAL_TABLET | Freq: Four times a day (QID) | ORAL | Status: DC | PRN
  Administered 2014-08-23 – 2014-08-24 (×2): 650 mg via ORAL
  Filled 2014-08-23 (×2): qty 2

## 2014-08-23 MED ORDER — AZITHROMYCIN 250 MG PO TABS
250.0000 mg | ORAL_TABLET | Freq: Every day | ORAL | Status: DC
  Administered 2014-08-24 – 2014-08-25 (×2): 250 mg via ORAL
  Filled 2014-08-23 (×2): qty 1

## 2014-08-23 MED ORDER — CHLORHEXIDINE GLUCONATE CLOTH 2 % EX PADS
6.0000 | MEDICATED_PAD | Freq: Every day | CUTANEOUS | Status: DC
  Administered 2014-08-24 – 2014-08-25 (×2): 6 via TOPICAL

## 2014-08-23 MED ORDER — INSULIN ASPART 100 UNIT/ML ~~LOC~~ SOLN
0.0000 [IU] | SUBCUTANEOUS | Status: DC
  Administered 2014-08-23 (×3): 5 [IU] via SUBCUTANEOUS
  Administered 2014-08-24: 3 [IU] via SUBCUTANEOUS
  Administered 2014-08-24 – 2014-08-25 (×5): 5 [IU] via SUBCUTANEOUS

## 2014-08-23 NOTE — Progress Notes (Signed)
Per patient- last drink was on Friday 08/19/2014. Admits to drinking two 40 oz bottles of beer per day.

## 2014-08-23 NOTE — Progress Notes (Signed)
Family Medicine Teaching Service Daily Progress Note Intern Pager: [phone]  Patient name: Jeffrey [name] Medical record number: [id number] Date of birth: May 04, 1966 Age: 48 y.o. Gender: male  Primary Care Provider: Howard Pouch, DO Consultants: None Code Status: Full  Pt Overview and Major Events to Date:  11/15:  Admitted for DKA with a corrected AG of 69 and a venous pH of 7.1. Also endorsing hematemesis. 11/16: CIWA scores elevated to 10-11. Very somnolent and not oriented  11/17: Patient more alert, AG   Assessment and Plan: Jeffrey [name] is a 48 y.o. male presenting with nausea and vomiting found to have CBG of 759, and AG of 69, and acidotic. PMH is significant for type 2 diabetes mellitus, OSA, HTN, hyperlipidemia, and h/o bleeding ulcer.   DKA: Afebrile. Bicarb 9 with gap 69 with corrected sodium. VBG with pH 7.1. Contacted patient's [facility] who states he last received 1 vial of Lantus and 1 vial of Novolog in August.  In the ED, started on insulin drip (which didn't get started until he was on the floor) along with 1L bolus x 2 and NS @ 150cc/hr. Tachycardia resolved. - Holding home lantus and SSI. - On D5-1/2NS at 200cc/hr - Q1 CBGs - Q4 BMET until gap closed (curently 14)  and to follow K (currently 4.5). - Once gap closed, will overlap with home Lantus or slightly less depending on insulin usage (Rx for 30u BID as an outpatient) x 2hrs  - PRN Zofran and Dextrose; zofran spaced to q8h prn given borderline QTc. - Will c/s nutrition for DM management and care management due to lack of insurance and difficulties with medications.    Hematemesis: Presumed with report of bright red emesis, currently brownish-red on admission exam. Per EMR, patient with a h/o a bleeding ulcer. Given the patient's h/o forceful vomiting, he could have a Mallory Weiss tear. Hemoglobin on admission was 16, possibly with some element of hemoconcentration. Currently stable at 14.9. INR  stable at 1.25. Patient denies any hematemesis in the last 24hrs. - Continue to monitor CBC daily> pending for this AM - If Hgb decreasing quickly, will place large-bore IVs, type/screen, and consult GI, and possibly transfuse based on if symptomatic at that time and hgb level. - Continue Protonix 40mg  IV BID - Will hold home ASA  Hyperkalemia: Resolved.  6.5 on admission with EKG with some possible peaking of T waves, sinus tach, RAD, borderline prolonged QT at 495. Repeat EKG with some improvement in peaked T waves.  - Monitor with Q4 bmets above - Improving with insulin and may need to be repleted in the future  Leukocytosis: WBC 18 on admission however elevated to 23.9 the subsequent day.  CXR normal on admission and patient afebrile, but given his high aspiration risk, a repeat CXR was performed which revealed a developing right lower lobe infiltrate.  - CBC this AM still pending - U/A with no LE and nitrite. - Will cover for aspiration pneumonia given new infiltrate on CXR from admission, however given possible cough prior to admission, will also cover for CAP. - Start azithromycin and clindamycin   History of alcohol abuse: Unable to assess how often patient drinks as he's very somnolent on exam  - CIWA scores improving from 7>0>1 overnight  - Has not required Ativan   Altered mental status: Improved. Patient very somnolent but arousable on exam on 11/6.  Most likely 2/2 alcohol withdraw, as patient with a h/o drinking 2- 40oz beers and 2+shots  daily. History of withdraw with tremors in the hospital, but no h/o seizures. CT of head negative. - CIWA as above   Foot pain: Most consistent with peripheral neuropathy from elevated CBGs, Improved - Continue gabapentin and eucerin for dry skin - Consider D/c of gabapentin tomorrow. - Continue to monitor   AKI Cr 1.86 on admission. Baseline appears to be 1.1. - Improvement to 1.11 this AM - Fluids per insulin drip protocol. - Follow-up  BMET  Tachycardia: Improving with IV fluids. Likely due to dehydration/discomfort - Continue to monitor   Hypertension: BP slightly elevated to 148/95. - Re-starting home Norvasc and losartan - Will switch IV metoprolol back to PO home regimen  -Continue to monitor   Hyperlipidemia: - Will start home pravastatin   OSA: - CPAP qHS   FEN/GI: NS@ 200cc/hr, clear liquids, IV Protonix Prophylaxis: No pharmacologic ppx due to bleed, SCDs  Disposition: SDU with CIWA   Subjective:  Patient doing well this AM, much more alert. Denies N/V with ice chips. Denies any abdominal pain.   Objective: Temp:  [97.7 F (36.5 C)-98.2 F (36.8 C)] 98.2 F (36.8 C) (11/17 0435) Pulse Rate:  [79-126] 86 (11/17 0435) Resp:  [10-19] 14 (11/17 0435) BP: (146-179)/(81-122) 169/102 mmHg (11/17 0435) SpO2:  [86 %-100 %] 100 % (11/17 0435) Physical Exam: General: Lying in bed in NAD, more alert today Cardiovascular: RRR. II/VI systolic murmur. No rubs or gallops noted.  Respiratory: No increased WOB. CTAB without wheezing, rhonchi, or crackles noted. Abdomen: +BS, soft, NT/ND Extremities: No swelling noted  Neuro: More alert today. A&O x4. PERRL, EOMI without nystagmus. Tremor noted in the hands bilaterally.  Laboratory:  Recent Labs Lab 08/21/14 1854 08/22/14 0133 08/22/14 0220  WBC 21.2* 23.3* 23.9*  HGB 13.9 14.8 14.9  HCT 42.2 43.5 44.3  PLT 235 241 234    Recent Labs Lab 08/22/14 1838 08/23/14 0018 08/23/14 0530  NA 144 143 143  K 4.4 4.2 4.5  CL 99 100 101  CO2 29 30 28   BUN 36* 32* 27*  CREATININE 1.35 1.25 1.11  CALCIUM 9.2 9.2 8.9  GLUCOSE 158* 136* 203*    VBG: pH: 7.136, pCO2: 33.1, pO2: 35.0, bicarb 11.2, acid-base deficit 17.0  EKG 11/15: Sinus tachycardia. HR 115. T wave inversion in avR. Odd appearing ST progression however no blatant elevation or depression. Possible T wave peaking. QTc 495  CXR 11/16: No active cardiopulmonary disease.  CT head: Probable  small vessel disease affecting the pons. Recommend correlation with any evidence of neurologic deficits.  CXR 11/17: Developing lateral right lower lobe infiltrate.   Archie Patten, MD 08/23/2014, 8:11 AM PGY-1, [place] Intern pager: [phone], text pages welcome

## 2014-08-23 NOTE — Progress Notes (Signed)
Patient complained of pain in hands related to being stuck by lab for blood draws. Lab personnel would like order to stick feet as patient is hard to draw blood from. MD notified and new orders given. Will continue to monitor patient.

## 2014-08-23 NOTE — Progress Notes (Signed)
Md paged for order clarification. Order calls for BMP to be checked Q4 while note calls for it to be checked Q2. MD prefer Q4 BMP checks. Will continue to monitor patient.

## 2014-08-23 NOTE — Progress Notes (Signed)
Pt refuses CPAP. Does not wear at home and does not wish to wear it while he is here. RT will monitor

## 2014-08-23 NOTE — Progress Notes (Signed)
CARE MANAGEMENT NOTE 08/23/2014  Patient:  Jeffrey [name], Jeffrey [name]   Account Number:  [id number]  Date Initiated:  08/22/2014  Documentation initiated by:  GRAVES-BIGELOW,BRENDA  Subjective/Objective Assessment:   Pt admitted for DKA. Pt without insurance. Pt follows up in the outpatient clinic. MD is aware to have pt speak to [facility] in office in ref to orange card assistance.     Action/Plan:   CM did go by to speak with pt and he was asleep. Pt was given the [place] in August and will not be eligible to use. CM will not be able to assist with medications at this time.   Anticipated DC Date:  08/24/2014   Anticipated DC Plan:  [place]  CM consult      Choice offered to / List presented to:             Status of service:  Completed, signed off Medicare Important Message given?  NO (If response is "NO", the following Medicare IM given date fields will be blank) Date Medicare IM given:   Medicare IM given by:   Date Additional Medicare IM given:   Additional Medicare IM given by:    Discharge Disposition:  HOME/SELF CARE  Per UR Regulation:  Reviewed for med. necessity/level of care/duration of stay  If discussed at [place] of Stay Meetings, dates discussed:    Comments:  08/23/2014 1200 NCM spoke to pt and states he is feeling quite down right now due to his financial situation. He is unable to pay his bills. Gave permission to speak to his wife, Jeffrey [name]. Pt will to follow up at [facility] until he has completed the application process with Family Medicine. [name] RN CCM Case Mgmt phone [phone]  08/23/2014 1130 NCM spoke to pt and gave info on [facility]. Explained to pt appt will be arranged post dc for follow up and he can pick up his meds at [facility] at discounted rate. Spoke to [facility] pharmacist and they have Lantus Solastar Pens and Toujeo samples in the clinic. Pt can receive for free. Pt not eligible for [facility]  program. NCM contacted Family Medicine and spoke to [place]. Pt has not completed financial application to receive assistance with seeing PCP and getting meds. He will have to complete application, once he has completed pt can follow up with PCP and [facility] [name] RN CCM Case Mgmt phone [phone]

## 2014-08-23 NOTE — Progress Notes (Signed)
Patient spO2 alarm registered oxygenation of 86% while on room air at about 0426. Patient was found to be in a deep sleep and was snoring. Patient placed on 2L [place] and spO2 subsequently rose to 100%. Patient was arousable, mental status checked and is intact. Nasal canula left in place to aid in oxygenation. Patient will continued to be monitored.

## 2014-08-23 NOTE — Clinical Social Work Psychosocial (Signed)
Clinical Social Work Department BRIEF PSYCHOSOCIAL ASSESSMENT 08/23/2014  Patient:  Jeffrey [name], Jeffrey [name]     Account Number:  [id number]     Admit date:  08/21/2014  Clinical Social Worker:  Marciano Sequin  Date/Time:  08/23/2014 03:05 PM  Referred by:  RN  Date Referred:  08/23/2014 Referred for  Substance Abuse   Other Referral:   Interview type:  Patient Other interview type:    PSYCHOSOCIAL DATA Living Status:  FAMILY Admitted from facility:   Level of care:   Primary support name:  Beevers,Sylvia Primary support relationship to patient:  SPOUSE Degree of support available:   Strong Support System    CURRENT CONCERNS Current Concerns  Financial Resources   Other Concerns:    SOCIAL WORK ASSESSMENT / PLAN CSW met the pt at bedside. CSW introduced self and purpose of the visit. Pt reported he currently lives at home with his wife and children. Pt reported that he started drinking two months ago. Pt reported prior to drinking he was sober for years. Pt identified his triggers to drinking again as financial difficulties and the loss of two dear friends. CSW and pt explore several community treatment facilities and [employer]. Pt reported that he has the well power to stop drinking. Pt denied his drinking as a problematic. Pt reported working with a Optometrist to address his financial problems.   Assessment/plan status:  Information/Referral to [employer] Other assessment/ plan:   SBIRT complete  Case Freight forwarder notified regarding medication assistance   Information/referral to community resources:   A list of 6 ETOH treatement agencies and a list of AA meetings    PATIENT'S/FAMILY'S RESPONSE TO PLAN OF CARE: Pt presented with a normal affect and calm mood. Pt was receptive to the community resources provided. Pt expressed wanting to find a different way to cope with life changes.   [place], MSW, [place]

## 2014-08-24 DIAGNOSIS — F1021 Alcohol dependence, in remission: Secondary | ICD-10-CM

## 2014-08-24 DIAGNOSIS — I10 Essential (primary) hypertension: Secondary | ICD-10-CM

## 2014-08-24 DIAGNOSIS — R110 Nausea: Secondary | ICD-10-CM

## 2014-08-24 DIAGNOSIS — E1310 Other specified diabetes mellitus with ketoacidosis without coma: Principal | ICD-10-CM

## 2014-08-24 DIAGNOSIS — E119 Type 2 diabetes mellitus without complications: Secondary | ICD-10-CM

## 2014-08-24 DIAGNOSIS — D72829 Elevated white blood cell count, unspecified: Secondary | ICD-10-CM

## 2014-08-24 DIAGNOSIS — J189 Pneumonia, unspecified organism: Secondary | ICD-10-CM

## 2014-08-24 DIAGNOSIS — R0602 Shortness of breath: Secondary | ICD-10-CM

## 2014-08-24 DIAGNOSIS — K920 Hematemesis: Secondary | ICD-10-CM

## 2014-08-24 LAB — CBC
HCT: 37.4 % — ABNORMAL LOW (ref 39.0–52.0)
Hemoglobin: 12.5 g/dL — ABNORMAL LOW (ref 13.0–17.0)
MCH: 33.9 pg (ref 26.0–34.0)
MCHC: 33.4 g/dL (ref 30.0–36.0)
MCV: 101.4 fL — ABNORMAL HIGH (ref 78.0–100.0)
Platelets: 173 10*3/uL (ref 150–400)
RBC: 3.69 MIL/uL — AB (ref 4.22–5.81)
RDW: 14.5 % (ref 11.5–15.5)
WBC: 7.8 10*3/uL (ref 4.0–10.5)

## 2014-08-24 LAB — GLUCOSE, CAPILLARY
GLUCOSE-CAPILLARY: 128 mg/dL — AB (ref 70–99)
GLUCOSE-CAPILLARY: 225 mg/dL — AB (ref 70–99)
GLUCOSE-CAPILLARY: 240 mg/dL — AB (ref 70–99)
Glucose-Capillary: 102 mg/dL — ABNORMAL HIGH (ref 70–99)
Glucose-Capillary: 185 mg/dL — ABNORMAL HIGH (ref 70–99)
Glucose-Capillary: 239 mg/dL — ABNORMAL HIGH (ref 70–99)
Glucose-Capillary: 59 mg/dL — ABNORMAL LOW (ref 70–99)

## 2014-08-24 LAB — BASIC METABOLIC PANEL
Anion gap: 9 (ref 5–15)
BUN: 12 mg/dL (ref 6–23)
CHLORIDE: 100 meq/L (ref 96–112)
CO2: 27 mEq/L (ref 19–32)
CREATININE: 0.79 mg/dL (ref 0.50–1.35)
Calcium: 8.1 mg/dL — ABNORMAL LOW (ref 8.4–10.5)
GFR calc Af Amer: >90 mL/min (ref >90)
GFR calc non Af Amer: >90 mL/min (ref >90)
Glucose, Bld: 108 mg/dL — ABNORMAL HIGH (ref 70–99)
Potassium: 3.8 mEq/L (ref 3.7–5.3)
Sodium: 136 mEq/L — ABNORMAL LOW (ref 137–147)

## 2014-08-24 MED ORDER — POLYETHYLENE GLYCOL 3350 17 G PO PACK
17.0000 g | PACK | Freq: Every day | ORAL | Status: DC
  Filled 2014-08-24 (×2): qty 1

## 2014-08-24 MED ORDER — PANTOPRAZOLE SODIUM 40 MG PO TBEC
40.0000 mg | DELAYED_RELEASE_TABLET | Freq: Every day | ORAL | Status: DC
  Administered 2014-08-25: 40 mg via ORAL
  Filled 2014-08-24: qty 1

## 2014-08-24 NOTE — Evaluation (Signed)
Physical Therapy Evaluation Patient Details Name: Jeffrey [name] MRN: [id number] DOB: [birth year] Today's Date: 08/24/2014   History of Present Illness  48 y.o. male presenting with nausea and vomiting found to have CBG of 759, and AG of 69, and acidotic. PMH is significant for type 2 diabetes mellitus, OSA, HTN, hyperlipidemia, and h/o bleeding ulcer.  Pt under CIWA precautions.   Clinical Impression  Pt adm due to above. Pt alert and oriented this session. Pt with gt abnormalities at this time, limiting functional mobility. Pt to benefit from skilled acute PT to address deficits indicated below (see PT problem list). Pt hope to D/C home tomorrow. Will plan to address gt stability with gt next session prior to D/C home.     Follow Up Recommendations Home health PT;Supervision/Assistance - 24 hour    Equipment Recommendations  Cane    Recommendations for Other Services       Precautions / Restrictions Precautions Precautions: Fall Restrictions Weight Bearing Restrictions: No      Mobility  Bed Mobility               General bed mobility comments: pt sitting EOB and returned to EOB  Transfers Overall transfer level: Needs assistance Equipment used:  (support through gt belt intiially and pt reaching for IV pol) Transfers: Sit to/from Stand Sit to Stand: Min guard         General transfer comment: pt with slight sway initially with standing; reaching for IV support and use of gt belt to guard; denied any dizziness intially  Ambulation/Gait Ambulation/Gait assistance: Min guard Ambulation Distance (Feet): 50 Feet Assistive device:  (IV pole supporting UE ) Gait Pattern/deviations: Step-through pattern;Decreased stride length;Shuffle;Wide base of support (externally rotated LEs; waddle like gt) Gait velocity: very decreased Gait velocity interpretation: Below normal speed for age/gender General Gait Details: pt fatigued very quickly; unsteady waddle like gt; use  of gt belt to guard pt and pt pushing IV pole to balance; pt c/o "lightheadedness" but did not feel dizzy  Stairs            Wheelchair Mobility    Modified Rankin (Stroke Patients Only)       Balance Overall balance assessment: Needs assistance;History of Falls Sitting-balance support: Feet supported;No upper extremity supported Sitting balance-Leahy Scale: Good     Standing balance support: During functional activity;Single extremity supported Standing balance-Leahy Scale: Poor Standing balance comment: using IV pole to provide UE support                             Pertinent Vitals/Pain Pain Assessment: No/denies pain    Home Living Family/patient expects to be discharged to:: Private residence Living Arrangements: Spouse/significant other;Children Available Help at Discharge: Family;Available 24 hours/day Type of Home: House Home Access: Ramped entrance     Home Layout: One level Home Equipment: None Additional Comments: initially pt reported having STE but later in the session stated he had a ramp for his side door    Prior Function Level of Independence: Independent         Comments: denies working at this time ; independent with ADLs     Hand Dominance        Extremity/Trunk Assessment   Upper Extremity Assessment: Defer to OT evaluation           Lower Extremity Assessment: Overall WFL for tasks assessed      Cervical / Trunk Assessment: Normal  Communication  Communication: No difficulties  Cognition Arousal/Alertness: Awake/alert Behavior During Therapy: WFL for tasks assessed/performed Overall Cognitive Status: Within Functional Limits for tasks assessed                      General Comments      Exercises        Assessment/Plan    PT Assessment Patient needs continued PT services  PT Diagnosis Difficulty walking;Generalized weakness   PT Problem List Decreased activity tolerance;Decreased  balance;Decreased mobility;Obesity  PT Treatment Interventions DME instruction;Gait training;Functional mobility training;Therapeutic activities;Therapeutic exercise;Balance training;Neuromuscular re-education;Patient/family education   PT Goals (Current goals can be found in the Care Plan section) Acute Rehab PT Goals Patient Stated Goal: to go home soon PT Goal Formulation: With patient Time For Goal Achievement: 08/28/14 Potential to Achieve Goals: Good    Frequency Min 3X/week   Barriers to discharge        Co-evaluation               End of Session Equipment Utilized During Treatment: Gait belt Activity Tolerance: Patient limited by fatigue Patient left: in bed;with call bell/phone within reach Nurse Communication: Mobility status         Time: [id number] PT Time Calculation (min) (ACUTE ONLY): 14 min   Charges:   PT Evaluation $Initial PT Evaluation Tier I: 1 Procedure PT Treatments $Gait Training: 8-22 mins   PT G CodesGustavus Bryant, [place]  [phone] 08/24/2014, 11:31 AM

## 2014-08-24 NOTE — Discharge Summary (Signed)
[facility] Discharge Summary  Patient name: Jeffrey [name] Medical record number: [id number] Date of birth: 11-21-1965 Age: 48 y.o. Gender: male Date of Admission: 08/21/2014  Date of Discharge: 08/25/14 Admitting Physician: Lupita Dawn, MD  Primary Care Provider: Howard Pouch, DO Consultants: None  Indication for Hospitalization: DKA  Discharge Diagnoses/Problem List:  DKA Type 2 diabetes mellitus, uncontrolled Peripheral neuropathy Hematemesis  Community acquired pneumonia vs aspiration pneumonia  Acute kidney injury  Hypertension Hyperlipidemia  OSA  Alcohol abuse with withdraw    Disposition: Home with wife (pt refuses HH-PT and cane)  Discharge Condition: Stable, improved   Discharge Exam:  Blood pressure 149/94, pulse 84, temperature 97.8 F (36.6 C), temperature source Oral, resp. rate 25, height $RemoveBe'5\' 10"'ANMItAOln$  (1.778 m), weight 237 lb 14 oz (107.9 kg), SpO2 100 %. General: Sitting up in bed eating graham crackers and peanut butter Cardiovascular: RRR. II/VI systolic murmur. No rubs or gallops noted.  Respiratory: No increased WOB. CTAB without wheezing, rhonchi, or crackles noted. Abdomen: +BS, soft, NT/ND Extremities: No swelling noted  Neuro: More alert today. A&O x4. PERRL, EOMI without nystagmus. No tremor noted.   Brief Hospital Course:  Mr. Wesch is a 48 y/o male who presented with 3 days of nausea/vomiting that progressed to brownish-red emesis on the day of presentation. In the ED, he was found to have a CBG of 759, an anion gap (corrected for Na) of 69, and a VBG pH of 7.1. In the ED, he continued to vomit and endorsed the sensation of "spikes" going through his feet.  DKA: He was admitted to the SDU for DKA by the [facility]. He was started on an insulin drip, IVFs, gabapentin and PRN Zofran. He had q1 CBGs and q2hr BMETs. He was switched to D5 containing fluids once his CBGs were <250 x 2. On 11/17, his AG was closed and he was  transitioned to Lantus 40u (patient reports 35u BID at home however concerns with compliance) with 2hr overlap. He continued on Lantus 40u daily with moderate SSI.  Given lack of insurance and means to acquire Lantus, patient provided with Lantus and Novolog pens from the clinic. Patient to be discharged with Lantus 40u daily and Novolog 5u TID with meals and his home sliding scale.  Hematemesis: Given the concerns for hematemesis, he was started on Protonix $RemoveBef'40mg'ewXDKahHib$  IV and repeat CBCs were obtained which revealed stable hemoglobins. On the subsequent day, his emesis had resolved, therefore GI was not consulted. Given his forceful vomiting, this was most likely 2/2 Mallory Weiss tear. Patient continued to endorse some discomfort with swallowing certain foods which is expected after days of emesis,  but denied any hemoptysis or hematemesis.   Alcohol abuse: Patient placed on CIWA protocol. Per his report, he drinks 2- 40oz beers per day and 2+shots per day (depening on who is hanging out with). On day after admission, patient very somnolent and not oriented to person, place, or time which subsequently resolved later in the PM.  CSW met with patient; at that time pt did not feel his drinking was problematic. The patient's CIWA scores improved, however on the day prior to discharge the patient was requesting (and receiving) for his "withdraw" despite CIWA scores of 0-1. He was discharged home with a small quantity of Ativan as it was uncertain if his scores were falsely low due to PRN Ativan he was given. He tells me he contacted [facility] and is looking to become sober again.  Right lower lobe infiltrate: WBC 18 on admission then subsequently elevated to 23.9 on the subsequent day. CXR nml on admission, however a repeat CXR revealed a developing RLL infiltrate. Given patient's aspiration risk as well as some reported cough prior to admission, treatment was started for both aspiration and community acquired  pneumonia with azithromycin and clindamycin on 11/17.  Was discharged with Rx for azithromcycin to complete a 5 day course and clindamycin to complete a 7 day course.    Issues for Follow Up:  --Patient should f/u with Dr. Valentina Lucks concerning diabetes management -- Patient should meet with Pamala Hurry about financial issues -- Please ensure patient picked up his antibiotics, as CM had to tell him numerous times where he should go to pick up Rxs at a discounted rate.  -- Follow up on dysphagia  -- Consider outpatient GI consult for possible EGD given hematemesis  -- Patient started on gabapentin due to neuropathy symptoms on admission, consider discontinuation.    Significant Procedures: None  Significant Labs and Imaging:   Recent Labs Lab 08/22/14 0220 08/23/14 0856 08/24/14 0320  WBC 23.9* 11.9* 7.8  HGB 14.9 13.7 12.5*  HCT 44.3 41.0 37.4*  PLT 234 183 173    Recent Labs Lab 08/23/14 0530 08/23/14 0856 08/23/14 1051 08/23/14 1946 08/24/14 0320  NA 143 142 142 137 136*  K 4.5 4.0 4.9 4.4 3.8  CL 101 102 103 99 100  CO2 $Re'28 27 25 25 27  'xfv$ GLUCOSE 203* 190* 90 285* 108*  BUN 27* 26* 24* 17 12  CREATININE 1.11 1.03 0.98 0.89 0.79  CALCIUM 8.9 8.8 8.6 8.1* 8.1*    VBG: pH: 7.136, pCO2: 33.1, pO2: 35.0, bicarb 11.2, acid-base deficit 17.0  EKG 11/15: Sinus tachycardia. HR 115. T wave inversion in avR. Odd appearing ST progression however no blatant elevation or depression. Possible T wave peaking. QTc 495  CXR 11/16: No active cardiopulmonary disease.  CT head: Probable small vessel disease affecting the pons. Recommend correlation with any evidence of neurologic deficits.  CXR 11/17: Developing lateral right lower lobe infiltrate.  Results/Tests Pending at Time of Discharge: None  Discharge Medications:    Medication List    ASK your doctor about these medications        amLODipine 10 MG tablet  Commonly known as:  NORVASC  Take 10 mg by mouth daily.     aspirin  EC 81 MG tablet  Take 81 mg by mouth daily.     insulin aspart 100 UNIT/ML injection  Commonly known as:  novoLOG  Inject 2-6 Units into the skin 3 (three) times daily before meals. On sliding scale     insulin glargine 100 UNIT/ML injection  Commonly known as:  LANTUS  Inject 0.3 mLs (30 Units total) into the skin 2 (two) times daily.     losartan 100 MG tablet  Commonly known as:  COZAAR  Take 1 tablet (100 mg total) by mouth daily.     metoprolol succinate 100 MG 24 hr tablet  Commonly known as:  TOPROL-XL  Take 1 tablet (100 mg total) by mouth daily. Take with or immediately following a meal.     multivitamin with minerals Tabs tablet  Take 1 tablet by mouth daily.     pantoprazole 40 MG tablet  Commonly known as:  PROTONIX  Take 1 tablet (40 mg total) by mouth daily.     pravastatin 80 MG tablet  Commonly known as:  PRAVACHOL  Take 1 tablet (80  mg total) by mouth daily.     simethicone 80 MG chewable tablet  Commonly known as:  MYLICON  Chew 1 tablet (80 mg total) by mouth every 6 (six) hours as needed for flatulence.     traZODone 100 MG tablet  Commonly known as:  DESYREL  Take 100 mg by mouth at bedtime.        Discharge Instructions: Please refer to Patient Instructions section of EMR for full details.  Patient was counseled important signs and symptoms that should prompt return to medical care, changes in medications, dietary instructions, activity restrictions, and follow up appointments.   Follow-Up Appointments: Follow-up Information    Follow up with Howard Pouch, DO On 08/30/2014.   Specialty:  Family Medicine   Why:  at 2pm for a hospital follow up   Contact information:   [place] [address] [phone]       Archie Patten, MD 08/24/2014, 10:53 PM PGY-1, [place]

## 2014-08-24 NOTE — Progress Notes (Signed)
Pt does not wear CPAP at home and does not want to wear it here. RT will continue to monitor.

## 2014-08-24 NOTE — Plan of Care (Signed)
Problem: Consults Goal: Diabetic Ketoacidosis (DKA) Patient Education See Patient Education Modules for education specifics.  Outcome: Progressing Goal: Diabetes Guidelines if Diabetic/Glucose > 140 If diabetic or lab glucose is > 140 mg/dl - Initiate Diabetes/Hyperglycemia Guidelines & Document Interventions  Outcome: Progressing  Problem: Phase I Progression Outcomes Goal: CBGs steadily decreasing on IV insulin drip Outcome: Completed/Met Date Met:  08/24/14 Goal: Monitor hydration status Outcome: Progressing Goal: Acidosis resolving Outcome: Progressing Goal: NPO or per MD order Outcome: Completed/Met Date Met:  08/24/14 Goal: K+ level approaching normal with therapy Outcome: Progressing Goal: Nausea/vomiting controlled with antiemetics Outcome: Completed/Met Date Met:  08/24/14 Goal: Pain controlled with appropriate interventions Outcome: Progressing Goal: OOB as tolerated unless otherwise ordered Outcome: Progressing Goal: Initial discharge plan identified Outcome: Progressing Goal: Voiding-avoid urinary catheter unless indicated Outcome: Completed/Met Date Met:  08/24/14 Goal: Pt. states reason for hospitalization Outcome: Completed/Met Date Met:  08/24/14 Goal: Order "Choose to Live" book from Pharmacy Outcome: Progressing  Problem: Phase II Progression Outcomes Goal: CBGs stable on SQ insulin Outcome: Progressing Goal: Monitor hydration status Outcome: Progressing Goal: Tolerating PO clear liquid diet Outcome: Completed/Met Date Met:  08/24/14 Goal: Potassium level normalizing Outcome: Progressing Goal: Nausea & vomiting resolved Outcome: Progressing Goal: Progress activity as tolerated unless otherwise ordered Outcome: Progressing Goal: Discharge plan established Outcome: Progressing  Problem: Phase III Progression Outcomes Goal: CBGs stable on SQ insulin Outcome: Progressing Goal: BMET stable Outcome: Progressing Goal: Tolerating PO carb modified  diet Outcome: Progressing Goal: Activity at appropriate level-compared to baseline (UP IN CHAIR FOR HEMODIALYSIS)  Outcome: Progressing Goal: Discharge plan remains appropriate-arrangements made Outcome: Progressing  Problem: Discharge Progression Outcomes Goal: CBGs controlled on DM discharge meds Outcome: Progressing Goal: Obtain signed CBG meter Rx form Outcome: Progressing Goal: Barriers To Progression Addressed/Resolved Outcome: Progressing Goal: Discharge plan in place and appropriate Outcome: Progressing Goal: Pain controlled with appropriate interventions Outcome: Progressing Goal: Hemodynamically stable Outcome: Progressing Goal: Complications resolved/controlled Outcome: Progressing Goal: Tolerating diet Outcome: Progressing Goal: Activity appropriate for discharge plan Outcome: Progressing Goal: Pt. states knowledge of home DM medications Outcome: Progressing

## 2014-08-24 NOTE — Progress Notes (Signed)
Family Medicine Teaching Service Daily Progress Note Intern Pager: [phone]  Patient name: Jeffrey [name] Medical record number: [id number] Date of birth: 03-28-1966 Age: 48 y.o. Gender: male  Primary Care Provider: Howard Pouch, DO Consultants: None Code Status: Full  Pt Overview and Major Events to Date:  11/15:  Admitted for DKA with a corrected AG of 69 and a venous pH of 7.1. Also endorsing hematemesis. 11/16: CIWA scores elevated to 10-11. Very somnolent and not oriented  11/17: Patient more alert, AG closed  Assessment and Plan: Jeffrey [name] is a 48 y.o. male presenting with nausea and vomiting found to have CBG of 759, and AG of 69, and acidotic. PMH is significant for type 2 diabetes mellitus, OSA, HTN, hyperlipidemia, and h/o bleeding ulcer.   DKA/T2DM: Resolved. S/p insulin drip. AG closed on 11/17. History of poor adherence. )Contacted patient's [place] who states he last received 1 vial of Lantus and 1 vial of Novolog in August).  - [place] lantus 40U (reports 35U bid at home) - Moderate SSI - Will discharge with scheduled mealtime insulin - PRN Zofran and Dextrose; zofran spaced to q8h prn given borderline QTc. - Nutrition consulted for DM management and care management due to lack of insurance and difficulties with medications.   - some residual generalized weakness, PT eval: home health PT  Hematemesis: Resolved. Likely 2/2 mallory weiss tears. Hemoglobin on admission was 16, possibly with some element of hemoconcentration.  - HgB stable - Home protonix 40mg  daily - Will hold home ASA  Hyperkalemia: Resolved.  6.5 on admission with EKG with some possible peaking of T waves, sinus tach, RAD, borderline prolonged QT at 495. Repeat EKG with some improvement in peaked T waves.  - Monitor with Q4 bmets above - Improving with insulin and may need to be repleted in the future  RLL infiltrate: WBC 18 on admission however elevated to 23.9 the subsequent day.   CXR normal on admission and patient afebrile, but given his high aspiration risk, a repeat CXR was performed which revealed a developing right lower lobe infiltrate.  - Will cover for aspiration pneumonia given new infiltrate on CXR from admission, however given possible cough prior to admission, will also cover for CAP. - Afebrile, WBC stable - Azithromycin and clindamycin (11/17- )  History of alcohol abuse: Unable to assess how often patient drinks as he's very somnolent on exam  - CIWA scores 0 - Has not required Ativan   Altered mental status: Improved. Patient very somnolent but arousable on exam on 11/6.  Most likely 2/2 alcohol withdraw, as patient with a h/o drinking 2- 40oz beers and 2+shots daily. History of withdraw with tremors in the hospital, but no h/o seizures. CT of head negative. - CIWA as above   Foot pain: Most consistent with peripheral neuropathy from elevated CBGs, Improved - Continue gabapentin and eucerin for dry skin - Continue to monitor   AKI. Resolved. Cr 1.86 on admission. Likely secondary to dehydration in setting of DKA.  Tachycardia: Improving with IV fluids. Likely due to dehydration/discomfort - Continue to monitor   Hypertension: - Home Norvasc, losartan, and metoprolol -Continue to monitor   Hyperlipidemia: - Will start home pravastatin   OSA: - CPAP qHS   FEN/GI: Saline lock IV, Carb modified diet Prophylaxis: No pharmacologic ppx due to bleed, SCDs  Disposition: SDU with CIWA, will need HH PT, Anticipate discharge tomorrow.   Subjective:  Patient doing well this AMt. Has not had BM. Feels somewhat  weak. No N/V. No fevers or chills. Objective: Temp:  [97.5 F (36.4 C)-98.2 F (36.8 C)] 98.1 F (36.7 C) (11/18 0748) Pulse Rate:  [73-92] 88 (11/18 0835) Resp:  [13-19] 16 (11/18 0835) BP: (131-164)/(71-110) 136/71 mmHg (11/18 0835) SpO2:  [98 %-100 %] 100 % (11/18 0835) Physical Exam: General: Lying in bed in NAD, more alert  today Cardiovascular: RRR. II/VI systolic murmur. No rubs or gallops noted.  Respiratory: No increased WOB. CTAB without wheezing, rhonchi, or crackles noted. Abdomen: +BS, soft, NT/ND Extremities: No swelling noted  Neuro: More alert today. A&O x4. PERRL, EOMI without nystagmus. Tremor noted in the hands bilaterally.  Laboratory:  Recent Labs Lab 08/22/14 0220 08/23/14 0856 08/24/14 0320  WBC 23.9* 11.9* 7.8  HGB 14.9 13.7 12.5*  HCT 44.3 41.0 37.4*  PLT 234 183 173    Recent Labs Lab 08/23/14 1051 08/23/14 1946 08/24/14 0320  NA 142 137 136*  K 4.9 4.4 3.8  CL 103 99 100  CO2 25 25 27   BUN 24* 17 12  CREATININE 0.98 0.89 0.79  CALCIUM 8.6 8.1* 8.1*  GLUCOSE 90 285* 108*    VBG: pH: 7.136, pCO2: 33.1, pO2: 35.0, bicarb 11.2, acid-base deficit 17.0  EKG 11/15: Sinus tachycardia. HR 115. T wave inversion in avR. Odd appearing ST progression however no blatant elevation or depression. Possible T wave peaking. QTc 495  CXR 11/16: No active cardiopulmonary disease.  CT head: Probable small vessel disease affecting the pons. Recommend correlation with any evidence of neurologic deficits.  CXR 11/17: Developing lateral right lower lobe infiltrate.   Dimas Chyle, MD 08/24/2014, 8:44 AM PGY-1, [place] Intern pager: [phone], text pages welcome

## 2014-08-25 ENCOUNTER — Other Ambulatory Visit: Payer: Self-pay | Admitting: Family Medicine

## 2014-08-25 DIAGNOSIS — K920 Hematemesis: Secondary | ICD-10-CM

## 2014-08-25 LAB — CBC
HCT: 36.4 % — ABNORMAL LOW (ref 39.0–52.0)
Hemoglobin: 11.9 g/dL — ABNORMAL LOW (ref 13.0–17.0)
MCH: 32.2 pg (ref 26.0–34.0)
MCHC: 32.7 g/dL (ref 30.0–36.0)
MCV: 98.6 fL (ref 78.0–100.0)
PLATELETS: 218 10*3/uL (ref 150–400)
RBC: 3.69 MIL/uL — ABNORMAL LOW (ref 4.22–5.81)
RDW: 13.9 % (ref 11.5–15.5)
WBC: 7.2 10*3/uL (ref 4.0–10.5)

## 2014-08-25 LAB — BASIC METABOLIC PANEL
ANION GAP: 14 (ref 5–15)
BUN: 9 mg/dL (ref 6–23)
CALCIUM: 8.2 mg/dL — AB (ref 8.4–10.5)
CO2: 25 mEq/L (ref 19–32)
CREATININE: 0.76 mg/dL (ref 0.50–1.35)
Chloride: 99 mEq/L (ref 96–112)
GFR calc non Af Amer: >90 mL/min (ref >90)
Glucose, Bld: 187 mg/dL — ABNORMAL HIGH (ref 70–99)
Potassium: 4.1 mEq/L (ref 3.7–5.3)
Sodium: 138 mEq/L (ref 137–147)

## 2014-08-25 LAB — GLUCOSE, CAPILLARY
GLUCOSE-CAPILLARY: 217 mg/dL — AB (ref 70–99)
Glucose-Capillary: 172 mg/dL — ABNORMAL HIGH (ref 70–99)
Glucose-Capillary: 231 mg/dL — ABNORMAL HIGH (ref 70–99)
Glucose-Capillary: 94 mg/dL (ref 70–99)

## 2014-08-25 MED ORDER — LORAZEPAM 1 MG PO TABS: 1.0000 mg | ORAL_TABLET | Freq: Four times a day (QID) | ORAL | Status: DC | PRN

## 2014-08-25 MED ORDER — AZITHROMYCIN 250 MG PO TABS: 250.0000 mg | ORAL_TABLET | Freq: Every day | ORAL | Status: DC

## 2014-08-25 MED ORDER — CLINDAMYCIN HCL 300 MG PO CAPS: 300.0000 mg | ORAL_CAPSULE | Freq: Four times a day (QID) | ORAL | Status: DC

## 2014-08-25 MED ORDER — INSULIN ASPART 100 UNIT/ML ~~LOC~~ SOLN: 5.0000 [IU] | Freq: Three times a day (TID) | SUBCUTANEOUS | Status: DC

## 2014-08-25 MED ORDER — INSULIN GLARGINE 100 UNIT/ML ~~LOC~~ SOLN: 40.0000 [IU] | Freq: Every day | SUBCUTANEOUS | Status: DC

## 2014-08-25 MED ORDER — GABAPENTIN 300 MG PO CAPS: 300.0000 mg | ORAL_CAPSULE | Freq: Two times a day (BID) | ORAL | Status: DC

## 2014-08-25 MED ORDER — GLUCOSE BLOOD VI STRP: ORAL_STRIP | Status: DC

## 2014-08-25 NOTE — Progress Notes (Signed)
Discussed discharge instructions and prescriptions with pt and wife. Both verbalize understanding. Personal belongings, insulin needles, cane, and insulin pens at bedside with pt. IVs removed. CCMD and Elink notified of discharge.

## 2014-08-25 NOTE — Plan of Care (Signed)
Problem: Consults Goal: Diabetic Ketoacidosis (DKA) Patient Education See Patient Education Modules for education specifics.  Outcome: Adequate for Discharge Goal: Skin Care Protocol Initiated - if Braden Score 18 or less If consults are not indicated, leave blank or document N/A  Outcome: Not Applicable Date Met:  09/29/48 Goal: Nutrition Consult-if indicated Outcome: Not Applicable Date Met:  75/30/05 Goal: Diabetes Guidelines if Diabetic/Glucose > 140 If diabetic or lab glucose is > 140 mg/dl - Initiate Diabetes/Hyperglycemia Guidelines & Document Interventions  Outcome: Adequate for Discharge  Problem: Phase I Progression Outcomes Goal: Monitor hydration status Outcome: Adequate for Discharge Goal: Acidosis resolving Outcome: Adequate for Discharge Goal: K+ level approaching normal with therapy Outcome: Adequate for Discharge Goal: Pain controlled with appropriate interventions Outcome: Completed/Met Date Met:  08/25/14 Goal: OOB as tolerated unless otherwise ordered Outcome: Adequate for Discharge Goal: Initial discharge plan identified Outcome: Completed/Met Date Met:  08/25/14 Goal: Order "Choose to Live" book from Pharmacy Outcome: Adequate for Discharge  Problem: Phase II Progression Outcomes Goal: CBGs stable on SQ insulin Outcome: Adequate for Discharge Goal: Acidosis resolved (CO2 > 20, ketones negative) Outcome: Adequate for Discharge Goal: Monitor hydration status Outcome: Adequate for Discharge Goal: Potassium level normalizing Outcome: Adequate for Discharge Goal: Progress activity as tolerated unless otherwise ordered Outcome: Adequate for Discharge Goal: Discharge plan established Outcome: Adequate for Discharge  Problem: Phase III Progression Outcomes Goal: CBGs stable on SQ insulin Outcome: Adequate for Discharge Goal: BMET stable Outcome: Adequate for Discharge Goal: Tolerating PO carb modified diet Outcome: Completed/Met Date Met:   08/25/14 Goal: Activity at appropriate level-compared to baseline (UP IN CHAIR FOR HEMODIALYSIS)  Outcome: Completed/Met Date Met:  08/25/14 Goal: Discharge plan remains appropriate-arrangements made Outcome: Completed/Met Date Met:  08/25/14  Problem: Discharge Progression Outcomes Goal: CBGs controlled on DM discharge meds Outcome: Adequate for Discharge Goal: Obtain signed CBG meter Rx form Outcome: Completed/Met Date Met:  08/25/14 Goal: Barriers To Progression Addressed/Resolved Outcome: Adequate for Discharge Goal: Discharge plan in place and appropriate Outcome: Adequate for Discharge Goal: Pain controlled with appropriate interventions Outcome: Adequate for Discharge Goal: Hemodynamically stable Outcome: Completed/Met Date Met:  08/08/10 Goal: Complications resolved/controlled Outcome: Adequate for Discharge Goal: Tolerating diet Outcome: Completed/Met Date Met:  08/25/14 Goal: Activity appropriate for discharge plan Outcome: Adequate for Discharge Goal: Pt. states knowledge of home DM medications Outcome: Adequate for Discharge

## 2014-08-25 NOTE — Plan of Care (Signed)
Problem: Acute Rehab PT Goals(only PT should resolve) Goal: Pt Will Go Supine/Side To Sit Outcome: Completed/Met Date Met:  08/25/14 Goal: Patient Will Transfer Sit To/From Stand Outcome: Completed/Met Date Met:  08/25/14

## 2014-08-25 NOTE — Progress Notes (Signed)
Physical Therapy Treatment Patient Details Name: Jeffrey [name] MRN: [id number] DOB: 08-30-1966 Today's Date: 08/25/2014    History of Present Illness 48 y.o. male presenting with nausea and vomiting found to have CBG of 759, and AG of 69, and acidotic. PMH is significant for type 2 diabetes mellitus, OSA, HTN, hyperlipidemia, and h/o bleeding ulcer.  Pt under CIWA precautions.     PT Comments    Pt moving well with increased gait and balance today but continues to demonstrate significant balance impairments in need of RW for safety with gait as well as assist with all single limb stance or high level balance activities. Pt educated for HEP to assist ankle and hip strategies and will continue to benefit from therapy to improve balance.    Follow Up Recommendations  Home health PT;Supervision/Assistance - 24 hour     Equipment Recommendations  Rolling walker with 5" wheels    Recommendations for Other Services       Precautions / Restrictions Precautions Precautions: Fall Restrictions Weight Bearing Restrictions: No    Mobility  Bed Mobility Overal bed mobility: Modified Independent                Transfers Overall transfer level: Modified independent   Transfers: Sit to/from Stand;Stand Pivot Transfers Sit to Stand: Modified independent (Device/Increase time) Stand pivot transfers: Modified independent (Device/Increase time)          Ambulation/Gait Ambulation/Gait assistance: Supervision Ambulation Distance (Feet): 300 Feet Assistive device: Rolling walker (2 wheeled);None Gait Pattern/deviations: Step-through pattern;Decreased stride length;Wide base of support   Gait velocity interpretation: Below normal speed for age/gender General Gait Details: pt initially walking 100' without AD with 2 posterior LOB with min assist to correct and very slow wide based gait. With RW for 200' pt with increased stride and improved balance without needs for  assist   Stairs            Wheelchair Mobility    Modified Rankin (Stroke Patients Only)       Balance Overall balance assessment: Needs assistance   Sitting balance-Leahy Scale: Good       Standing balance-Leahy Scale: Fair                      Cognition Arousal/Alertness: Awake/alert Behavior During Therapy: WFL for tasks assessed/performed Overall Cognitive Status: Within Functional Limits for tasks assessed                      Exercises General Exercises - Lower Extremity Hip Flexion/Marching: AROM;Both;10 reps;Seated Toe Raises: AROM;Both;10 reps;Seated    General Comments        Pertinent Vitals/Pain Pain Assessment: No/denies pain  O2 sats 98% on RA HR 98           PT Goals (current goals can now be found in the care plan section) Progress towards PT goals: Progressing toward goals    Frequency       PT Plan Current plan remains appropriate;Equipment recommendations need to be updated    Co-evaluation             End of Session Equipment Utilized During Treatment: Gait belt Activity Tolerance: Patient tolerated treatment well Patient left: in chair;with call bell/phone within reach     Time: 0910-0936 PT Time Calculation (min) (ACUTE ONLY): 26 min  Charges:  $Gait Training: 8-22 mins $Physical Performance Test: 8-22 mins  G CodesMelford Aase 08/28/2014, 10:37 AM Elwyn Reach, [place]

## 2014-08-25 NOTE — Progress Notes (Signed)
Hypoglycemic Event  CBG: 69  Treatment: 15 GM carbohydrate snack  Symptoms: None  Follow-up CBG: Time CBG Result:94   Possible Reasons for Event: Medication regimen: too much insulin  Comments/MD notified:NA    Kathlen Mody, Conni Elliot  Remember to initiate Hypoglycemia Order Set & complete

## 2014-08-25 NOTE — Discharge Instructions (Signed)
Please call the clinic today to make a lab appointment for tomorrow (11/20) to have your hemoglobin checked again. Please keep your follow up appointment with your PCP, as she will need to write you new prescriptions (and provide you with discount cards for the insulin). Please make an appointment with Pamala Hurry at our clinic to work on insurance or alternatives  Diabetic Ketoacidosis Diabetic ketoacidosis (DKA) is a life-threatening complication of type 1 diabetes. It must be quickly recognized and treated. Treatment requires hospitalization. CAUSES  When there is no insulin in the body, glucose (sugar) cannot be used, and the body breaks down fat for energy. When fat breaks down, acids (ketones) build up in the blood. Very high levels of glucose and high levels of acids lead to severe loss of body fluids (dehydration) and other dangerous chemical changes. This stresses your vital organs and can cause coma or death. SIGNS AND SYMPTOMS   Tiredness (fatigue).  Weight loss.  Excessive thirst.  Ketones in your urine.  Light-headedness.  Fruity or sweet smelling breath.  Excessive urination.  Visual changes.  Confusion or irritability.  Nausea or vomiting.  Rapid breathing.  Stomachache or abdominal pain. DIAGNOSIS  Your health care provider will diagnose DKA based on your history, physical exam, and blood tests. The health care provider will check to see if you have another illness that caused you to go into DKA. Most of this will be done quickly in an emergency room. TREATMENT   Fluid replacement to correct dehydration.  Insulin.  Correction of electrolytes, such as potassium and sodium.  Antibiotic medicines. PREVENTION  Always take your insulin. Do not skip your insulin injections.  If you are sick, treat yourself quickly. Your body often needs more insulin to fight the illness.  Check your blood glucose regularly.  Check urine ketones if your blood glucose is greater  than 240 milligrams per deciliter (mg/dL).  Do not use outdated (expired) insulin.  If your blood glucose is high, drink plenty of fluids. This helps flush out ketones. HOME CARE INSTRUCTIONS   If you are sick, follow the advice of your health care provider.  To prevent dehydration, drink enough water and fluids to keep your urine clear or pale yellow.  If you cannot eat, alternate between drinking fluids with sugar (soda, juices, flavored gelatin) and salty fluids (broth, bouillon).  If you can eat, follow your usual diet and drink sugar-free liquids (water, diet drinks).  Always take your usual dose of insulin. If you cannot eat or if your glucose is getting too low, call your health care provider for further instructions.  Continue to monitor your blood or urine ketones every 3-4 hours around the clock. Set your alarm clock or have someone wake you up. If you are too sick, have someone test it for you.  Rest and avoid exercise. SEEK MEDICAL CARE IF:   You have a fever.  You have ketones in your urine, or your blood glucose is higher than a level your health care provider suggests. You may need extra insulin. Call your health care provider if you need advice on adjusting your insulin.  You cannot drink at least a tablespoon (15 mL) of fluid every 15-20 minutes.  You have been vomiting for more than 2 hours.  You have symptoms of DKA:  Fruity smelling breath.  Breathing faster or slower.  Becoming very sleepy. SEEK IMMEDIATE MEDICAL CARE IF:   You have signs of dehydration:  Decreased urination.  Increased thirst.  Dry  skin and mouth.  Light-headedness.  Your blood glucose is very high (as advised by your health care provider) twice in a row.  You faint.  You have chest pain or trouble breathing.  You have a sudden, severe headache.  You have sudden weakness in one arm or one leg.  You have sudden trouble speaking or swallowing.  You have vomiting or  diarrhea that is getting worse after 3 hours.  You have abdominal pain. MAKE SURE YOU:   Understand these instructions.  Will watch your condition.  Will get help right away if you are not doing well or get worse. Document Released: 09/20/2000 Document Revised: 09/28/2013 Document Reviewed: 03/29/2009 [facility] Patient Information 2015 [place], [place]. This information is not intended to replace advice given to you by your health care provider. Make sure you discuss any questions you have with your health care provider.

## 2014-08-30 ENCOUNTER — Ambulatory Visit (INDEPENDENT_AMBULATORY_CARE_PROVIDER_SITE_OTHER): Payer: Self-pay | Admitting: Family Medicine

## 2014-08-30 ENCOUNTER — Encounter: Payer: Self-pay | Admitting: Family Medicine

## 2014-08-30 VITALS — BP 161/93 | HR 80 | Temp 98.2°F | Resp 16 | Wt 258.0 lb

## 2014-08-30 DIAGNOSIS — J189 Pneumonia, unspecified organism: Secondary | ICD-10-CM

## 2014-08-30 DIAGNOSIS — E1110 Type 2 diabetes mellitus with ketoacidosis without coma: Secondary | ICD-10-CM

## 2014-08-30 DIAGNOSIS — E1310 Other specified diabetes mellitus with ketoacidosis without coma: Secondary | ICD-10-CM

## 2014-08-30 DIAGNOSIS — I10 Essential (primary) hypertension: Secondary | ICD-10-CM

## 2014-08-30 MED ORDER — CIPROFLOXACIN HCL 500 MG PO TABS: 500.0000 mg | ORAL_TABLET | Freq: Two times a day (BID) | ORAL | Status: AC

## 2014-08-30 NOTE — Assessment & Plan Note (Signed)
Patient with our antibiotic coverage since discharge. I have prescribed Cipro 500 mg for 7 days to help with coverage, since it is on the $4 list at [facility]. Patient states that he will be able to afford this and will go pick up now. Otherwise he appears asymptomatic on exam today. Patient to follow-up in 2-4 weeks.

## 2014-08-30 NOTE — Assessment & Plan Note (Signed)
Follow-up with diabetic ketoacidosis for hospital admission. Patient is taking his insulin as directed, and is able to read back his regimen well. Serving for 2 low hypoglycemic events over the last few days. Have advised him to cut back his Lantus to 35 units at night, from 40 units at night. Patient is to continue taking his blood sugars fasting in the morning, write these down. He is to follow-up in 4 weeks

## 2014-08-30 NOTE — Progress Notes (Signed)
Subjective:    Patient ID: Jeffrey [name], male    DOB: May 09, 1966, 48 y.o.   MRN: [id number]  HPI  hosp f/u: Patient presents to [facility] for hospital follow-up following admission for ketoacidosis and community-acquired pneumonia. Patient states that he has been unable to afford his medications in the outpatient setting. He has been unable to get his antibiotic medication treatment for his community-acquired pneumonia. He states he's in the middle of the process of filling out the application for the orange card. He is unable to qualify for the map program. He states he is feeling better since his hospital discharge, but he has had 2 hypoglycemic events. He states today he awoke with a 34 blood sugar, which responded well to orange juice. At that time he states he couldn't really move well with altered mental status and was breaking out in a sweat. He lives with his wife, who assisted him in getting orange juice, and he quickly responded. He takes 40 units of Lantus around 9 or 10:00 at night. He states that he does eat multiple times throughout the night as many as 7 PM, 11 PM and again at 2 AM. In addition he is on 5 units of NovoLog before each meal if his blood sugars are elevated. He reports he has enough insulin to last him for a little while, but they were able to give him some on hospital discharge. He has not been able to afford all of his blood pressure medications at this time.   Past Medical History  Diagnosis Date  . Hypertension   . Blood transfusion 2014    "related to bleeding ulcer"  . Venous insufficiency 12/13/2011  . Cardiac arrest 12/07/2012  . Anxiety   . Acute renal insufficiency 12/08/2012  . High cholesterol   . OSA on CPAP 12/07/2012  . Type II diabetes mellitus   . Bleeding ulcer 2014  . GERD (gastroesophageal reflux disease)    Allergies  Allergen Reactions  . Lisinopril Other (See Comments)    Pt reports nose bleed.   Current Outpatient  Prescriptions on File Prior to Visit  Medication Sig Dispense Refill  . amLODipine (NORVASC) 10 MG tablet Take 10 mg by mouth daily.    [name] aspirin EC 81 MG tablet Take 81 mg by mouth daily.    [name] azithromycin (ZITHROMAX) 250 MG tablet Take 1 tablet (250 mg total) by mouth daily. Starting on 11/20 2 tablet 0  . clindamycin (CLEOCIN) 300 MG capsule Take 1 capsule (300 mg total) by mouth 4 (four) times daily. Starting at noon today. 20 capsule 0  . gabapentin (NEURONTIN) 300 MG capsule Take 1 capsule (300 mg total) by mouth 2 (two) times daily. 60 capsule 0  . glucose blood test strip Use as instructed 100 each 12  . insulin aspart (NOVOLOG) 100 UNIT/ML injection Inject 5-10 Units into the skin 3 (three) times daily with meals. Inject 5units with 3 meals, PLUS your previous sliding scale 10 mL 11  . insulin glargine (LANTUS) 100 UNIT/ML injection Inject 0.4 mLs (40 Units total) into the skin daily. 10 mL 11  . LORazepam (ATIVAN) 1 MG tablet Take 1 tablet (1 mg total) by mouth every 6 (six) hours as needed for anxiety. 20 tablet 0  . losartan (COZAAR) 100 MG tablet Take 1 tablet (100 mg total) by mouth daily. 30 tablet 0  . metoprolol succinate (TOPROL-XL) 100 MG 24 hr tablet Take 1 tablet (100 mg total) by mouth  daily. Take with or immediately following a meal. 30 tablet 3  . Multiple Vitamin (MULTIVITAMIN WITH MINERALS) TABS tablet Take 1 tablet by mouth daily.    . pantoprazole (PROTONIX) 40 MG tablet Take 1 tablet (40 mg total) by mouth daily. 90 tablet 3  . pravastatin (PRAVACHOL) 80 MG tablet Take 1 tablet (80 mg total) by mouth daily. 30 tablet 3  . simethicone (MYLICON) 80 MG chewable tablet Chew 1 tablet (80 mg total) by mouth every 6 (six) hours as needed for flatulence. (Patient not taking: Reported on 08/21/2014) 30 tablet 0  . traZODone (DESYREL) 100 MG tablet Take 100 mg by mouth at bedtime.     No current facility-administered medications on file prior to visit.     Review of  Systems Per HPI    Objective:   Physical Exam BP 161/93 mmHg  Pulse 80  Temp(Src) 98.2 F (36.8 C) (Oral)  Resp 16  Wt 258 lb (117.028 kg)  SpO2 100% Gen: Pleasant, male, no acute distress, nontoxic in appearance, well-developed, well-nourished. HEENT: AT. [place].  Bilateral eyes without injections or icterus. MMM.  CV: RRR  Chest: CTAB, no wheeze or crackles Abd: Soft. Round. NTND. BS present. No Masses palpated.  Ext: No erythema. No  edema. [name] 2/4 PT Skin: No rashes, purpura or petechiae.  Neuro:  Normal gait. PERLA. EOMi. Alert. Cranial nerves II through XII intact. Psych: Patient with a mild flat affect today. Seems to be mildly depressed due to financial situation.     Assessment & Plan:

## 2014-08-30 NOTE — Assessment & Plan Note (Signed)
Patient unable to afford all medications. Blood pressure mildly elevated above goal today. He has an appointment on Monday to talk to financial assistant, to apply for the orange card. At that time he will likely be able to get most of his medications on the [facility] plan at the [facility] department. Patient advised to exercise 150 minutes a week, and eating a low-salt diet. Follow-up in 4 weeks

## 2014-08-30 NOTE — Patient Instructions (Signed)
Please call Pamala Hurry our financial assistant early Monday morning and make an appointment to complete all of your orange card application. If you are approved she will give you an orange card that day, and you will be able to get your prescriptions for $4 a piece at the health department.  Take your Lantus at night as you have prior, but take 35 units instead. Continue to check your blood sugars first thing in the morning fasting, and write them down.   I have prescribed you medication called Cipro, you will take it 2 times a day for 7 days, you can get this at [facility] on the $4 plan. This is for your pneumonia. These make an appointment with me in approximately 4 weeks, so we can revisit your blood pressure and diabetes after you get the orange card and are able to take all your medications as directed.

## 2014-09-23 ENCOUNTER — Other Ambulatory Visit: Payer: Self-pay | Admitting: Family Medicine

## 2014-09-23 NOTE — Telephone Encounter (Signed)
Pt asking for ativan refills. He was prescribed ativan on hospital discharge in November for his potential alcohol withdraw symptoms. Refill is not appropriate without evaluation. Pt needs to make an appointment. Please make him aware. Thank you.

## 2014-09-23 NOTE — Telephone Encounter (Signed)
Needs refill on lorazepam. [facility] at [place]

## 2014-09-26 NOTE — Telephone Encounter (Signed)
LVM for patient to call back to inform him of below

## 2014-09-28 NOTE — Telephone Encounter (Signed)
LVM for pt to return call this is the second attempt to inform him of the info below. [name], Annebelle Bostic D

## 2014-10-08 ENCOUNTER — Inpatient Hospital Stay (HOSPITAL_COMMUNITY): Payer: Self-pay

## 2014-10-08 ENCOUNTER — Inpatient Hospital Stay (HOSPITAL_COMMUNITY)
Admission: EM | Admit: 2014-10-08 | Discharge: 2014-10-11 | DRG: 378 | Disposition: A | Discharge status: Home / Self Care | Payer: Self-pay | Attending: Family Medicine | Admitting: Family Medicine

## 2014-10-08 ENCOUNTER — Encounter (HOSPITAL_COMMUNITY): Payer: Self-pay

## 2014-10-08 ENCOUNTER — Emergency Department (HOSPITAL_COMMUNITY): Payer: Self-pay

## 2014-10-08 DIAGNOSIS — R7309 Other abnormal glucose: Secondary | ICD-10-CM | POA: Insufficient documentation

## 2014-10-08 DIAGNOSIS — Z8674 Personal history of sudden cardiac arrest: Secondary | ICD-10-CM

## 2014-10-08 DIAGNOSIS — E669 Obesity, unspecified: Secondary | ICD-10-CM | POA: Diagnosis present

## 2014-10-08 DIAGNOSIS — K920 Hematemesis: Principal | ICD-10-CM | POA: Diagnosis present

## 2014-10-08 DIAGNOSIS — Z961 Presence of intraocular lens: Secondary | ICD-10-CM | POA: Diagnosis present

## 2014-10-08 DIAGNOSIS — W102XXA Fall (on)(from) incline, initial encounter: Secondary | ICD-10-CM

## 2014-10-08 DIAGNOSIS — D6489 Other specified anemias: Secondary | ICD-10-CM | POA: Insufficient documentation

## 2014-10-08 DIAGNOSIS — D62 Acute posthemorrhagic anemia: Secondary | ICD-10-CM | POA: Diagnosis present

## 2014-10-08 DIAGNOSIS — Z9114 Patient's other noncompliance with medication regimen: Secondary | ICD-10-CM | POA: Diagnosis present

## 2014-10-08 DIAGNOSIS — K449 Diaphragmatic hernia without obstruction or gangrene: Secondary | ICD-10-CM | POA: Diagnosis present

## 2014-10-08 DIAGNOSIS — Y907 Blood alcohol level of 200-239 mg/100 ml: Secondary | ICD-10-CM | POA: Diagnosis present

## 2014-10-08 DIAGNOSIS — Z794 Long term (current) use of insulin: Secondary | ICD-10-CM

## 2014-10-08 DIAGNOSIS — R400 Somnolence: Secondary | ICD-10-CM | POA: Diagnosis present

## 2014-10-08 DIAGNOSIS — K921 Melena: Secondary | ICD-10-CM

## 2014-10-08 DIAGNOSIS — R471 Dysarthria and anarthria: Secondary | ICD-10-CM

## 2014-10-08 DIAGNOSIS — E780 Pure hypercholesterolemia: Secondary | ICD-10-CM | POA: Diagnosis present

## 2014-10-08 DIAGNOSIS — E785 Hyperlipidemia, unspecified: Secondary | ICD-10-CM | POA: Insufficient documentation

## 2014-10-08 DIAGNOSIS — Z6832 Body mass index (BMI) 32.0-32.9, adult: Secondary | ICD-10-CM

## 2014-10-08 DIAGNOSIS — IMO0002 Reserved for concepts with insufficient information to code with codable children: Secondary | ICD-10-CM | POA: Diagnosis present

## 2014-10-08 DIAGNOSIS — E869 Volume depletion, unspecified: Secondary | ICD-10-CM | POA: Diagnosis present

## 2014-10-08 DIAGNOSIS — K7010 Alcoholic hepatitis without ascites: Secondary | ICD-10-CM | POA: Diagnosis present

## 2014-10-08 DIAGNOSIS — D638 Anemia in other chronic diseases classified elsewhere: Secondary | ICD-10-CM | POA: Insufficient documentation

## 2014-10-08 DIAGNOSIS — M1990 Unspecified osteoarthritis, unspecified site: Secondary | ICD-10-CM | POA: Diagnosis present

## 2014-10-08 DIAGNOSIS — K2210 Ulcer of esophagus without bleeding: Secondary | ICD-10-CM

## 2014-10-08 DIAGNOSIS — Z9119 Patient's noncompliance with other medical treatment and regimen: Secondary | ICD-10-CM | POA: Diagnosis present

## 2014-10-08 DIAGNOSIS — F1010 Alcohol abuse, uncomplicated: Secondary | ICD-10-CM | POA: Diagnosis present

## 2014-10-08 DIAGNOSIS — Z888 Allergy status to other drugs, medicaments and biological substances status: Secondary | ICD-10-CM

## 2014-10-08 DIAGNOSIS — I872 Venous insufficiency (chronic) (peripheral): Secondary | ICD-10-CM | POA: Diagnosis present

## 2014-10-08 DIAGNOSIS — N179 Acute kidney failure, unspecified: Secondary | ICD-10-CM | POA: Insufficient documentation

## 2014-10-08 DIAGNOSIS — I10 Essential (primary) hypertension: Secondary | ICD-10-CM | POA: Diagnosis present

## 2014-10-08 DIAGNOSIS — E041 Nontoxic single thyroid nodule: Secondary | ICD-10-CM | POA: Diagnosis present

## 2014-10-08 DIAGNOSIS — Z9842 Cataract extraction status, left eye: Secondary | ICD-10-CM

## 2014-10-08 DIAGNOSIS — R740 Nonspecific elevation of levels of transaminase and lactic acid dehydrogenase [LDH]: Secondary | ICD-10-CM | POA: Diagnosis present

## 2014-10-08 DIAGNOSIS — F419 Anxiety disorder, unspecified: Secondary | ICD-10-CM | POA: Diagnosis present

## 2014-10-08 DIAGNOSIS — Z8711 Personal history of peptic ulcer disease: Secondary | ICD-10-CM

## 2014-10-08 DIAGNOSIS — R55 Syncope and collapse: Secondary | ICD-10-CM | POA: Insufficient documentation

## 2014-10-08 DIAGNOSIS — I951 Orthostatic hypotension: Secondary | ICD-10-CM | POA: Diagnosis present

## 2014-10-08 DIAGNOSIS — F17220 Nicotine dependence, chewing tobacco, uncomplicated: Secondary | ICD-10-CM | POA: Diagnosis present

## 2014-10-08 DIAGNOSIS — G4733 Obstructive sleep apnea (adult) (pediatric): Secondary | ICD-10-CM | POA: Insufficient documentation

## 2014-10-08 DIAGNOSIS — E860 Dehydration: Secondary | ICD-10-CM | POA: Diagnosis present

## 2014-10-08 DIAGNOSIS — E1165 Type 2 diabetes mellitus with hyperglycemia: Secondary | ICD-10-CM | POA: Diagnosis present

## 2014-10-08 DIAGNOSIS — K2980 Duodenitis without bleeding: Secondary | ICD-10-CM | POA: Diagnosis present

## 2014-10-08 DIAGNOSIS — K210 Gastro-esophageal reflux disease with esophagitis: Secondary | ICD-10-CM | POA: Diagnosis present

## 2014-10-08 DIAGNOSIS — R739 Hyperglycemia, unspecified: Secondary | ICD-10-CM | POA: Insufficient documentation

## 2014-10-08 LAB — BASIC METABOLIC PANEL
ANION GAP: 21 — AB (ref 5–15)
Anion gap: 14 (ref 5–15)
BUN: 30 mg/dL — AB (ref 6–23)
BUN: 35 mg/dL — AB (ref 6–23)
CALCIUM: 8.5 mg/dL (ref 8.4–10.5)
CHLORIDE: 96 meq/L (ref 96–112)
CO2: 25 mmol/L (ref 19–32)
CO2: 28 mmol/L (ref 19–32)
CREATININE: 1.32 mg/dL (ref 0.50–1.35)
Calcium: 8.4 mg/dL (ref 8.4–10.5)
Chloride: 99 mEq/L (ref 96–112)
Creatinine, Ser: 1.26 mg/dL (ref 0.50–1.35)
GFR calc Af Amer: 71 mL/min — ABNORMAL LOW (ref >90)
GFR calc Af Amer: 75 mL/min — ABNORMAL LOW (ref >90)
GFR calc non Af Amer: 61 mL/min — ABNORMAL LOW (ref >90)
GFR calc non Af Amer: 65 mL/min — ABNORMAL LOW (ref >90)
GLUCOSE: 113 mg/dL — AB (ref 70–99)
Glucose, Bld: 274 mg/dL — ABNORMAL HIGH (ref 70–99)
POTASSIUM: 4.1 mmol/L (ref 3.5–5.1)
Potassium: 4.6 mmol/L (ref 3.5–5.1)
Sodium: 142 mmol/L (ref 135–145)
Sodium: 141 mmol/L (ref 135–145)

## 2014-10-08 LAB — I-STAT CHEM 8, ED
BUN: 27 mg/dL — ABNORMAL HIGH (ref 6–23)
CHLORIDE: 93 meq/L — AB (ref 96–112)
CREATININE: 1.5 mg/dL — AB (ref 0.50–1.35)
Calcium, Ion: 1.04 mmol/L — ABNORMAL LOW (ref 1.12–1.23)
GLUCOSE: 435 mg/dL — AB (ref 70–99)
HEMATOCRIT: 39 % (ref 39.0–52.0)
Hemoglobin: 13.3 g/dL (ref 13.0–17.0)
Potassium: 4.5 mmol/L (ref 3.5–5.1)
SODIUM: 136 mmol/L (ref 135–145)
TCO2: 22 mmol/L (ref 0–100)

## 2014-10-08 LAB — RAPID URINE DRUG SCREEN, HOSP PERFORMED
Amphetamines: NOT DETECTED
BARBITURATES: NOT DETECTED
Benzodiazepines: NOT DETECTED
Cocaine: NOT DETECTED
OPIATES: NOT DETECTED
TETRAHYDROCANNABINOL: NOT DETECTED

## 2014-10-08 LAB — CBC
HCT: 30.2 % — ABNORMAL LOW (ref 39.0–52.0)
HEMATOCRIT: 33.6 % — AB (ref 39.0–52.0)
HEMATOCRIT: 29.6 % — AB (ref 39.0–52.0)
Hemoglobin: 11 g/dL — ABNORMAL LOW (ref 13.0–17.0)
Hemoglobin: 9.6 g/dL — ABNORMAL LOW (ref 13.0–17.0)
Hemoglobin: 10 g/dL — ABNORMAL LOW (ref 13.0–17.0)
MCH: 33.4 pg (ref 26.0–34.0)
MCH: 31.8 pg (ref 26.0–34.0)
MCH: 33.3 pg (ref 26.0–34.0)
MCHC: 32.7 g/dL (ref 30.0–36.0)
MCHC: 32.4 g/dL (ref 30.0–36.0)
MCHC: 33.1 g/dL (ref 30.0–36.0)
MCV: 102.1 fL — ABNORMAL HIGH (ref 78.0–100.0)
MCV: 98 fL (ref 78.0–100.0)
MCV: 100.7 fL — AB (ref 78.0–100.0)
PLATELETS: 155 10*3/uL (ref 150–400)
Platelets: 172 10*3/uL (ref 150–400)
Platelets: 129 10*3/uL — ABNORMAL LOW (ref 150–400)
RBC: 3.29 MIL/uL — ABNORMAL LOW (ref 4.22–5.81)
RBC: 3.02 MIL/uL — AB (ref 4.22–5.81)
RBC: 3 MIL/uL — ABNORMAL LOW (ref 4.22–5.81)
RDW: 13.7 % (ref 11.5–15.5)
RDW: 13.4 % (ref 11.5–15.5)
RDW: 13.6 % (ref 11.5–15.5)
WBC: 8.8 10*3/uL (ref 4.0–10.5)
WBC: 10.5 10*3/uL (ref 4.0–10.5)
WBC: 11.1 10*3/uL — ABNORMAL HIGH (ref 4.0–10.5)

## 2014-10-08 LAB — DIFFERENTIAL
BASOS PCT: 1 % (ref 0–1)
Basophils Absolute: 0.1 10*3/uL (ref 0.0–0.1)
EOS PCT: 0 % (ref 0–5)
Eosinophils Absolute: 0 10*3/uL (ref 0.0–0.7)
Lymphocytes Relative: 16 % (ref 12–46)
Lymphs Abs: 1.4 10*3/uL (ref 0.7–4.0)
MONO ABS: 0.4 10*3/uL (ref 0.1–1.0)
Monocytes Relative: 4 % (ref 3–12)
NEUTROS ABS: 6.9 10*3/uL (ref 1.7–7.7)
Neutrophils Relative %: 79 % — ABNORMAL HIGH (ref 43–77)

## 2014-10-08 LAB — COMPREHENSIVE METABOLIC PANEL
ALBUMIN: 2.9 g/dL — AB (ref 3.5–5.2)
ALT: 96 U/L — ABNORMAL HIGH (ref 0–53)
AST: 183 U/L — ABNORMAL HIGH (ref 0–37)
Alkaline Phosphatase: 154 U/L — ABNORMAL HIGH (ref 39–117)
Anion gap: 25 — ABNORMAL HIGH (ref 5–15)
BILIRUBIN TOTAL: 1.1 mg/dL (ref 0.3–1.2)
BUN: 25 mg/dL — AB (ref 6–23)
CALCIUM: 8.7 mg/dL (ref 8.4–10.5)
CO2: 22 mmol/L (ref 19–32)
Chloride: 91 mEq/L — ABNORMAL LOW (ref 96–112)
Creatinine, Ser: 1.39 mg/dL — ABNORMAL HIGH (ref 0.50–1.35)
GFR calc Af Amer: 67 mL/min — ABNORMAL LOW (ref >90)
GFR, EST NON AFRICAN AMERICAN: 58 mL/min — AB (ref >90)
GLUCOSE: 422 mg/dL — AB (ref 70–99)
Potassium: 4.5 mmol/L (ref 3.5–5.1)
Sodium: 138 mmol/L (ref 135–145)
Total Protein: 6.6 g/dL (ref 6.0–8.3)

## 2014-10-08 LAB — URINALYSIS, ROUTINE W REFLEX MICROSCOPIC
BILIRUBIN URINE: NEGATIVE
Glucose, UA: >1000 mg/dL — AB
Hgb urine dipstick: NEGATIVE
KETONES UR: 40 mg/dL — AB
LEUKOCYTES UA: NEGATIVE
NITRITE: NEGATIVE
PH: 5.5 (ref 5.0–8.0)
PROTEIN: NEGATIVE mg/dL
SPECIFIC GRAVITY, URINE: 1.033 — AB (ref 1.005–1.030)
Urobilinogen, UA: 1 mg/dL (ref 0.0–1.0)

## 2014-10-08 LAB — I-STAT TROPONIN, ED: Troponin i, poc: 0 ng/mL (ref 0.00–0.08)

## 2014-10-08 LAB — CBG MONITORING, ED
GLUCOSE-CAPILLARY: 398 mg/dL — AB (ref 70–99)
GLUCOSE-CAPILLARY: 373 mg/dL — AB (ref 70–99)

## 2014-10-08 LAB — ETHANOL: Alcohol, Ethyl (B): 210 mg/dL — ABNORMAL HIGH (ref 0–9)

## 2014-10-08 LAB — PROTIME-INR
INR: 1.24 (ref 0.00–1.49)
PROTHROMBIN TIME: 15.7 s — AB (ref 11.6–15.2)

## 2014-10-08 LAB — TSH: TSH: 0.596 u[IU]/mL (ref 0.350–4.500)

## 2014-10-08 LAB — MRSA PCR SCREENING: MRSA by PCR: POSITIVE — AB

## 2014-10-08 LAB — APTT: APTT: 30 s (ref 24–37)

## 2014-10-08 LAB — TROPONIN I: Troponin I: <0.03 ng/mL (ref <0.031)

## 2014-10-08 LAB — URINE MICROSCOPIC-ADD ON

## 2014-10-08 MED ORDER — LORAZEPAM 1 MG PO TABS
1.0000 mg | ORAL_TABLET | Freq: Four times a day (QID) | ORAL | Status: AC | PRN
  Administered 2014-10-10 – 2014-10-11 (×3): 1 mg via ORAL
  Filled 2014-10-08 (×3): qty 1

## 2014-10-08 MED ORDER — FOLIC ACID 1 MG PO TABS
1.0000 mg | ORAL_TABLET | Freq: Every day | ORAL | Status: DC
  Administered 2014-10-09 – 2014-10-11 (×2): 1 mg via ORAL
  Filled 2014-10-08 (×4): qty 1

## 2014-10-08 MED ORDER — LORAZEPAM 2 MG/ML IJ SOLN
1.0000 mg | Freq: Four times a day (QID) | INTRAMUSCULAR | Status: AC | PRN
  Administered 2014-10-08 – 2014-10-09 (×3): 1 mg via INTRAVENOUS
  Filled 2014-10-08 (×3): qty 1

## 2014-10-08 MED ORDER — DEXTROSE-NACL 5-0.45 % IV SOLN
INTRAVENOUS | Status: DC
  Administered 2014-10-08: 23:00:00 via INTRAVENOUS

## 2014-10-08 MED ORDER — PANTOPRAZOLE SODIUM 40 MG IV SOLR
40.0000 mg | Freq: Two times a day (BID) | INTRAVENOUS | Status: DC
  Administered 2014-10-08 – 2014-10-10 (×5): 40 mg via INTRAVENOUS
  Filled 2014-10-08 (×7): qty 40

## 2014-10-08 MED ORDER — MUPIROCIN 2 % EX OINT
1.0000 "application " | TOPICAL_OINTMENT | Freq: Two times a day (BID) | CUTANEOUS | Status: DC
  Administered 2014-10-09 – 2014-10-11 (×6): 1 via NASAL
  Filled 2014-10-08 (×2): qty 22

## 2014-10-08 MED ORDER — SODIUM CHLORIDE 0.9 % IV SOLN
INTRAVENOUS | Status: DC
  Administered 2014-10-08: 3.1 [IU]/h via INTRAVENOUS
  Filled 2014-10-08: qty 2.5

## 2014-10-08 MED ORDER — SODIUM CHLORIDE 0.9 % IJ SOLN
3.0000 mL | Freq: Two times a day (BID) | INTRAMUSCULAR | Status: DC
  Administered 2014-10-08 – 2014-10-11 (×5): 3 mL via INTRAVENOUS

## 2014-10-08 MED ORDER — SODIUM CHLORIDE 0.9 % IV SOLN: INTRAVENOUS | Status: DC

## 2014-10-08 MED ORDER — THIAMINE HCL 100 MG/ML IJ SOLN
100.0000 mg | Freq: Every day | INTRAMUSCULAR | Status: DC
  Administered 2014-10-08 – 2014-10-10 (×2): 100 mg via INTRAVENOUS
  Filled 2014-10-08 (×4): qty 1

## 2014-10-08 MED ORDER — DEXTROSE 50 % IV SOLN: 25.0000 mL | INTRAVENOUS | Status: DC | PRN

## 2014-10-08 MED ORDER — SODIUM CHLORIDE 0.9 % IV BOLUS (SEPSIS)
1000.0000 mL | Freq: Once | INTRAVENOUS | Status: AC
  Administered 2014-10-08: 1000 mL via INTRAVENOUS

## 2014-10-08 MED ORDER — ADULT MULTIVITAMIN W/MINERALS CH
1.0000 | ORAL_TABLET | Freq: Every day | ORAL | Status: DC
  Administered 2014-10-09 – 2014-10-11 (×2): 1 via ORAL
  Filled 2014-10-08 (×4): qty 1

## 2014-10-08 MED ORDER — CHLORHEXIDINE GLUCONATE CLOTH 2 % EX PADS
6.0000 | MEDICATED_PAD | Freq: Every day | CUTANEOUS | Status: DC
  Administered 2014-10-09 – 2014-10-11 (×3): 6 via TOPICAL

## 2014-10-08 MED ORDER — VITAMIN B-1 100 MG PO TABS
100.0000 mg | ORAL_TABLET | Freq: Every day | ORAL | Status: DC
  Administered 2014-10-09 – 2014-10-11 (×2): 100 mg via ORAL
  Filled 2014-10-08 (×4): qty 1

## 2014-10-08 NOTE — ED Notes (Signed)
Per [facility], pt was found on floor this morning in the bathroom by his wife who heard him fall. Wife noticed coffee ground emesis in the bathroom but no diarrhea. Pt has hx of bleeding ulcers. Supine EMS had BP of 90 palpated. When sat up radial became faint and pt became more disoriented. Placed in c-collar. Pt is DM and CBG of 556 in route. Given 200 ml NS with 18g to LAC. In route pt started having slurred speech and expressive aphasia. Pt has been out of all of his medications the last 6 months except for his BP meds. VSS 98 HR, 93/50 BP, 99 on RA.

## 2014-10-08 NOTE — ED Notes (Signed)
Patient is resting comfortably. 

## 2014-10-08 NOTE — ED Notes (Signed)
Family at bedside. 

## 2014-10-08 NOTE — Consult Note (Signed)
Referring Physician: Dr. Jeneen Rinks, ED    Chief Complaint: code stroke, dysarthria  HPI:                                                                                                                                         Jeffrey [name] is an 49 y.o. male with a past medical history significant for HTN, DM type II, hypercholesterolemia, OSA on CPAP, anxiety, bleeding ulcer, brought in via EMS due to acute onset of the above mentioned symptoms. Wife saw him well last night, and this morning heard him fall, found him in the floor and EMS was called. Wife also noticed coffee ground emesis in the bathroom. EMS documented SBP 90 palpated. When sat up radial became faint and pt became more disoriented. Placed in c-collar. Pt is DM and CBG of 556 in route. On route to the hospital patient had " slurred speech and expressive aphasia" and code stroke was activated. NIHSS 1. CT brain showed no acute abnormality. Presently, denies HA, vertigo, double vision, focal weakness or numbness, slurred speech, language or vision impairment.  Date last known well: 10/07/14 Time last known well: uncertain tPA Given: no, out of the window NIHSS: 1   Past Medical History  Diagnosis Date  . Hypertension   . Blood transfusion 2014    "related to bleeding ulcer"  . Venous insufficiency 12/13/2011  . Cardiac arrest 12/07/2012  . Anxiety   . Acute renal insufficiency 12/08/2012  . High cholesterol   . OSA on CPAP 12/07/2012  . Type II diabetes mellitus   . Bleeding ulcer 2014  . GERD (gastroesophageal reflux disease)     Past Surgical History  Procedure Laterality Date  . Knee arthroscopy Left ~ 1999  . Esophagogastroduodenoscopy Left 11/26/2012    Procedure: ESOPHAGOGASTRODUODENOSCOPY (EGD);  Surgeon: Wonda Horner, MD;  Location: [facility] ENDOSCOPY;  Service: Endoscopy;  Laterality: Left;  . Cardiovascular stress test  03/16/13    Very Poor Exercise Tolerance; NON DIAGNOSTIC TEST  . Cataract extraction w/ intraocular  lens implant Left 03/07/2014    Gro[facility] @ [facility]    No family history on file. Social History:  reports that he has never smoked. His smokeless tobacco use includes Snuff. He reports that he drinks about 3.6 oz of alcohol per week. He reports that he does not use illicit drugs.  Allergies:  Allergies  Allergen Reactions  . Lisinopril Other (See Comments)    Pt reports nose bleed.    Medications:  I have reviewed the patient's current medications.  ROS:                                                                                                                                       History obtained from the patient and chart review  General ROS: negative for - chills, fatigue, fever, night sweats, weight gain or weight loss Psychological ROS: negative for - behavioral disorder, hallucinations, memory difficulties, mood swings or suicidal ideation Ophthalmic ROS: negative for - blurry vision, double vision, eye pain or loss of vision ENT ROS: negative for - epistaxis, nasal discharge, oral lesions, sore throat, tinnitus or vertigo Allergy and Immunology ROS: negative for - hives or itchy/watery eyes Hematological and Lymphatic ROS: negative for - bleeding problems, bruising or swollen lymph nodes Endocrine ROS: negative for - galactorrhea, hair pattern changes, polydipsia/polyuria or temperature intolerance Respiratory ROS: negative for - cough, hemoptysis, shortness of breath or wheezing Cardiovascular ROS: negative for - chest pain, dyspnea on exertion, edema or irregular heartbeat Gastrointestinal ROS: negative for - abdominal pain, diarrhea, nausea/vomiting or stool incontinence Genito-Urinary ROS: negative for - dysuria, hematuria, incontinence or urinary frequency/urgency Musculoskeletal ROS: negative for - joint swelling or muscular  weakness Neurological ROS: as noted in HPI Dermatological ROS: negative for rash and skin lesion changes    Physical exam: pleasant male in no apparent distress. Blood pressure 104/67, pulse 94, temperature 99.2 F (37.3 C), temperature source Temporal, resp. rate 23, height $RemoveBe'5\' 10"'[id number]$  (1.778 m), weight 104.191 kg (229 lb 11.2 oz), SpO2 97 %. Head: normocephalic. Neck: supple, no bruits, no JVD. Cardiac: no murmurs. Lungs: clear. Abdomen: soft, no tender, no mass. Extremities: no edema. Skin: no edema.   Neurologic Examination:                                                                                                      General: Mental Status: Alert, oriented, thought content appropriate.  Speech fluent without evidence of aphasia.  Able to follow 3 step commands without difficulty. Cranial Nerves: II: Discs flat bilaterally; Visual fields grossly normal, pupils equal, round, reactive to light and accommodation III,IV, VI: ptosis not present, extra-ocular motions intact bilaterally V,VII: smile symmetric, facial light touch sensation normal bilaterally VIII: hearing normal bilaterally IX,X: gag reflex present XI: bilateral shoulder shrug XII: midline tongue extension without atrophy or fasciculations  Motor: 5/5 all over Tone and bulk:normal tone throughout; no atrophy noted Sensory: Pinprick and light touch intact throughout, bilaterally Deep Tendon  Reflexes:  Right: Upper Extremity   Left: Upper extremity   biceps (C-5 to C-6) 2/4   biceps (C-5 to C-6) 2/4 tricep (C7) 2/4    triceps (C7) 2/4 Brachioradialis (C6) 2/4  Brachioradialis (C6) 2/4  Lower Extremity Lower Extremity  quadriceps (L-2 to L-4) 2/4   quadriceps (L-2 to L-4) 2/4 Achilles (S1) 2/4   Achilles (S1) 2/4  Plantars: Right: downgoing   Left: downgoing Cerebellar: normal finger-to-nose,  normal heel-to-shin test Gait:  No tested for safety reasons. CV: pulses palpable throughout    Results for  orders placed or performed during the hospital encounter of 10/08/14 (from the past 48 hour(s))  Ethanol     Status: Abnormal   Collection Time: 10/08/14  8:43 AM  Result Value Ref Range   Alcohol, Ethyl (B) 210 (H) 0 - 9 mg/dL    Comment:        LOWEST DETECTABLE LIMIT FOR SERUM ALCOHOL IS 11 mg/dL FOR MEDICAL PURPOSES ONLY   Protime-INR     Status: Abnormal   Collection Time: 10/08/14  8:43 AM  Result Value Ref Range   Prothrombin Time 15.7 (H) 11.6 - 15.2 seconds   INR 1.24 0.00 - 1.49  APTT     Status: None   Collection Time: 10/08/14  8:43 AM  Result Value Ref Range   aPTT 30 24 - 37 seconds  CBC     Status: Abnormal   Collection Time: 10/08/14  8:43 AM  Result Value Ref Range   WBC 8.8 4.0 - 10.5 K/uL   RBC 3.29 (L) 4.22 - 5.81 MIL/uL   Hemoglobin 11.0 (L) 13.0 - 17.0 g/dL   HCT 33.6 (L) 39.0 - 52.0 %   MCV 102.1 (H) 78.0 - 100.0 fL   MCH 33.4 26.0 - 34.0 pg   MCHC 32.7 30.0 - 36.0 g/dL   RDW 13.7 11.5 - 15.5 %   Platelets 172 150 - 400 K/uL  Differential     Status: Abnormal   Collection Time: 10/08/14  8:43 AM  Result Value Ref Range   Neutrophils Relative % 79 (H) 43 - 77 %   Neutro Abs 6.9 1.7 - 7.7 K/uL   Lymphocytes Relative 16 12 - 46 %   Lymphs Abs 1.4 0.7 - 4.0 K/uL   Monocytes Relative 4 3 - 12 %   Monocytes Absolute 0.4 0.1 - 1.0 K/uL   Eosinophils Relative 0 0 - 5 %   Eosinophils Absolute 0.0 0.0 - 0.7 K/uL   Basophils Relative 1 0 - 1 %   Basophils Absolute 0.1 0.0 - 0.1 K/uL  Comprehensive metabolic panel     Status: Abnormal   Collection Time: 10/08/14  8:43 AM  Result Value Ref Range   Sodium 138 135 - 145 mmol/L    Comment: Please note change in reference range.   Potassium 4.5 3.5 - 5.1 mmol/L    Comment: Please note change in reference range.   Chloride 91 (L) 96 - 112 mEq/L   CO2 22 19 - 32 mmol/L   Glucose, Bld 422 (H) 70 - 99 mg/dL   BUN 25 (H) 6 - 23 mg/dL   Creatinine, Ser 1.39 (H) 0.50 - 1.35 mg/dL   Calcium 8.7 8.4 - 10.5 mg/dL    Total Protein 6.6 6.0 - 8.3 g/dL   Albumin 2.9 (L) 3.5 - 5.2 g/dL   AST 183 (H) 0 - 37 U/L   ALT 96 (H) 0 - 53 U/L   Alkaline  Phosphatase 154 (H) 39 - 117 U/L   Total Bilirubin 1.1 0.3 - 1.2 mg/dL   GFR calc non Af Amer 58 (L) >90 mL/min   GFR calc Af Amer 67 (L) >90 mL/min    Comment: (NOTE) The eGFR has been calculated using the CKD EPI equation. This calculation has not been validated in all clinical situations. eGFR's persistently <90 mL/min signify possible Chronic Kidney Disease.    Anion gap 25 (H) 5 - 15  I-Stat Troponin, ED (not at [facility])     Status: None   Collection Time: 10/08/14  8:57 AM  Result Value Ref Range   Troponin i, poc 0.00 0.00 - 0.08 ng/mL   Comment 3            Comment: Due to the release kinetics of cTnI, a negative result within the first hours of the onset of symptoms does not rule out myocardial infarction with certainty. If myocardial infarction is still suspected, repeat the test at appropriate intervals.   I-Stat Chem 8, ED     Status: Abnormal   Collection Time: 10/08/14  8:58 AM  Result Value Ref Range   Sodium 136 135 - 145 mmol/L   Potassium 4.5 3.5 - 5.1 mmol/L   Chloride 93 (L) 96 - 112 mEq/L   BUN 27 (H) 6 - 23 mg/dL   Creatinine, Ser 1.50 (H) 0.50 - 1.35 mg/dL   Glucose, Bld 435 (H) 70 - 99 mg/dL   Calcium, Ion 1.04 (L) 1.12 - 1.23 mmol/L   TCO2 22 0 - 100 mmol/L   Hemoglobin 13.3 13.0 - 17.0 g/dL   HCT 39.0 39.0 - 52.0 %  CBG monitoring, ED     Status: Abnormal   Collection Time: 10/08/14  9:05 AM  Result Value Ref Range   Glucose-Capillary 398 (H) 70 - 99 mg/dL   Ct Head Wo Contrast  10/08/2014   CLINICAL DATA:  Express as eighth ectasia, confusion, code stroke initial evaluation  EXAM: CT HEAD WITHOUT CONTRAST  CT CERVICAL SPINE WITHOUT CONTRAST  TECHNIQUE: Multidetector CT imaging of the head and cervical spine was performed following the standard protocol without intravenous contrast. Multiplanar CT image reconstructions of  the cervical spine were also generated.  COMPARISON:  None.  FINDINGS: CT HEAD FINDINGS  Mild to moderate diffuse cortical atrophy. No abnormal attenuation to suggest mass or vascular territory infarct. No hemorrhage or extra-axial fluid. No hydrocephalus. Calvarium intact. Reticular opacities throughout the left maxillary sinus suggest maxillary sinus inflammatory change.  CT CERVICAL SPINE FINDINGS  Normal alignment with no prevertebral soft tissue swelling. No fracture. Mild degenerative disc disease at C4-5 with large osteophyte anteriorly at C5-6. Moderate C6-7 degenerative disc disease. Lung apices clear. Cervical soft tissues negative. Bilateral carotid calcification. Several right thyroid nodules the largest seen in the midpole and measuring 2 cm. Nodules are hypo attenuating with no complicating features.  IMPRESSION: 1. No acute intracranial findings. While to moderate age-related atrophy. 2. No acute cervical spine abnormalities. Degenerative changes noted. 3. Right thyroid nodules. Consider further evaluation with thyroid ultrasound.  Critical Value/emergent results were called by telephone at the time of interpretation on 10/08/2014 at 9:22 am to Dr. Aram Beecham, who verbally acknowledged these results.   Electronically Signed   By: Skipper Cliche M.D.   On: 10/08/2014 09:21   Ct Cervical Spine Wo Contrast  10/08/2014   CLINICAL DATA:  Express as eighth ectasia, confusion, code stroke initial evaluation  EXAM: CT HEAD WITHOUT CONTRAST  CT CERVICAL  SPINE WITHOUT CONTRAST  TECHNIQUE: Multidetector CT imaging of the head and cervical spine was performed following the standard protocol without intravenous contrast. Multiplanar CT image reconstructions of the cervical spine were also generated.  COMPARISON:  None.  FINDINGS: CT HEAD FINDINGS  Mild to moderate diffuse cortical atrophy. No abnormal attenuation to suggest mass or vascular territory infarct. No hemorrhage or extra-axial fluid. No hydrocephalus.  Calvarium intact. Reticular opacities throughout the left maxillary sinus suggest maxillary sinus inflammatory change.  CT CERVICAL SPINE FINDINGS  Normal alignment with no prevertebral soft tissue swelling. No fracture. Mild degenerative disc disease at C4-5 with large osteophyte anteriorly at C5-6. Moderate C6-7 degenerative disc disease. Lung apices clear. Cervical soft tissues negative. Bilateral carotid calcification. Several right thyroid nodules the largest seen in the midpole and measuring 2 cm. Nodules are hypo attenuating with no complicating features.  IMPRESSION: 1. No acute intracranial findings. While to moderate age-related atrophy. 2. No acute cervical spine abnormalities. Degenerative changes noted. 3. Right thyroid nodules. Consider further evaluation with thyroid ultrasound.  Critical Value/emergent results were called by telephone at the time of interpretation on 10/08/2014 at 9:22 am to Dr. Aram Beecham, who verbally acknowledged these results.   Electronically Signed   By: Skipper Cliche M.D.   On: 10/08/2014 09:21    Assessment: 49 y.o. male brought in as a code stroke after EMS noted " dysarthria and expressive aphasia" while patient being transported to the ED. NIHSS 1 upon initial evaluation, but subsequently 0. Blood sugar >500. Although he has risk factors for stroke, I am more inclined to believe patient presentation is most likely due to severe hyperglycemia and hypotension/syncope and therefore will defer neurological testing at this moment.   Dorian Pod, MD [facility] [phone]  10/08/2014, 10:27 AM

## 2014-10-08 NOTE — H&P (Signed)
[facility] Admission History and Physical Service Pager: [phone]  Patient name: Jeffrey [name] Medical record number: [id number] Date of birth: 11-21-1965 Age: 49 y.o. Gender: male  Primary Care Provider: Howard Pouch, DO Consultants: none Code Status: full code (discussed with pt upon admission)  Chief Complaint: syncope  Assessment and Plan: Jeffrey [name] is a 49 y.o. male presenting with syncope, hypotension, hyperglycemia, and coffee ground emesis. PMH is significant for HTN, ETOH abuse, T2DM, bleeding ulcers, and medication noncompliance. Hx also significant for PEA arrest in March 2015, thought to be secondary to combination of OSA and alcohol abuse.  # Syncope, hypotension - likely orthostatic syncope secondary to hypotension/volume depletion. No prodrome that pt is able to recall. CT head negative for any acute findings. - admit to hospital stepdown unit, monitor on telemetry - trend Hgb, repeat CBC periodically - hydrate with IVF - give another NS bolus now then NS @ 125cc/hr - TSH, echo, cycle troponins, AM EKG - hold home blood pressure medications - check orthostatics  # Anemia, coffee ground emesis - hx of GI bleed from ulcers requiring transfusion in the past. Hgb currently 11, baseline appears 13-14. Had EGD done in February 2014 which showed 1cm linear ulceration in distal esophagus, no gastric ulcers. - gastroccult if vomits again - FOBT when able - maintain two large bore IV's - monitor closely in SDU - trend CBC as above - transfusion as needed, threshold <8. Patient very hesitant to receive transfusion so will need to speak with him prior to ordering any transfusions. - protonix IV BID - consider gastric emptying study for nausea/vomiting if we get the sense that this is more chronic and ongoing  # Hyperglycemia - T2DM, noncompliant with insulin therapy at home, reports low blood sugars in 20's-30's at home while on insulin -  glucomander for now until sugars better controlled - repeat BMET now (>4 hrs since last drawn) given AG >20  # Thyroid nodule - noted on CT of cervical spine. Consider ultrasound of thyroid outpatient vs inpatient. Will check TSH as part of syncope workup.  # ETOH abuse: alcohol level 210 on admission. Daily drinker. - CIWA protocol, prn ativan - supplement with MVI, B12, folate  # AKI: Cr 1.39 on admission. Baseline around 1. AG 25 with normal bicarb. Suspect prerenal etiology from dehydration. -  Hydrate with NS - trend creatinine  # Transaminitis: AST 183, ALT 96, alk phos 154. New acute rise. Likely related to ETOH use vs possibly also due to mild shock liver from hypotension. Coags essentially normal  (only abnormality is PT 15.7, normal cutoff is <15.2) - repeat LFT's & coags in AM - hepatitis panel (note HCV Ab negative on 08/22/14)  # HTN - likely noncompliant at home. Pt states ran out of BP med this morning. Holding all meds now due to hypotension.  # HLD - on pravastatin at home. Likely noncompliant - hold statin given acute transaminitis  # OSA: CPAP QHS  FEN/GI: NS bolus now then NS @ 125cc/hr. Diabetic diet once passes stroke swallow screen Prophylaxis: SCD's (no heparin due to possible GI bleed)  Disposition: admit to stepdown unit, dispo pending clinical improvement & stabilization of labs  History of Present Illness:  Jeffrey [name] is a 49 y.o. male presenting with syncopal episode.  Patient was at home with his wife this morning, when she heard him fall. She went and found him on the floor. She called EMS, who came and got  him. He was reportedly hypotensive with blood pressures in the 90s systolic upon arrival. He was given a normal saline bolus of unknown volume en route with improvement in his blood pressures. Patient is unable to recall any of the events surrounding his syncopal event. He denies any prodromal symptoms, but does not remember passing out. He  notes that his heart may have been racing some in the last few days. He has been eating and drinking normally. He is noncompliant with his home medications. He does report that he's had episodes of low blood sugars, causing him to stop his insulin altogether over the last several days. He did have 2 episodes of emesis this morning, which were reportedly coffee-ground appearing. He denies having any blood in his stool. Last bowel movement was this morning and was reportedly normal. He denies having any chest pain, shortness of breath, abdominal pain, or other concerns right now.  He came into the emergency room as a code stroke, as he was noted to have some aphasia during transport. He was evaluated by the ER physician as well as the on-call neurologist, who did not feel that this represented a stroke. CT head and neck were negative for any acute pathology.  Review Of Systems: Per HPI. Otherwise 12 point review of systems was performed and was unremarkable.  Patient Active Problem List   Diagnosis Date Noted  . CAP (community acquired pneumonia) 08/30/2014  . SOB (shortness of breath)   . Pneumonia, organism unspecified   . Diabetic ketoacidosis without coma associated with type 1 diabetes mellitus   . Hematemesis with nausea   . Leukocytosis   . Cough   . Dehydration 05/18/2014  . Hypovolemia dehydration 05/18/2014  . DKA (diabetic ketoacidoses) 01/04/2014  . OSA on CPAP 12/28/2012  . Acute renal insufficiency 12/08/2012  . Pulmonary edema 12/08/2012  . Hypoglycemia 12/07/2012  . Cardiac arrest 12/07/2012  . Diarrhea 12/07/2012  . Obesity 12/07/2012  . Anoxic brain injury 12/07/2012  . Acute respiratory failure 12/06/2012  . GI bleed 11/26/2012  . Venous insufficiency 12/13/2011  . Leg wound, left 12/03/2011  . INSOMNIA, CHRONIC 02/14/2009  . 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 ALCOHOL ABUSE, HX OF 11/10/2007  . ERECTILE DYSFUNCTION 04/14/2007  . DM type 2  goal A1C below 7.5 12/04/2006  . HYPERCHOLESTEROLEMIA 12/04/2006  . OBESITY, NOS 12/04/2006  . ANXIETY 12/04/2006  . HYPERTENSION, BENIGN SYSTEMIC 12/04/2006  . ARTHRITIS 12/04/2006   Past Medical History: Past Medical History  Diagnosis Date  . Hypertension   . Blood transfusion 2014    "related to bleeding ulcer"  . Venous insufficiency 12/13/2011  . Cardiac arrest 12/07/2012  . Anxiety   . Acute renal insufficiency 12/08/2012  . High cholesterol   . OSA on CPAP 12/07/2012  . Type II diabetes mellitus   . Bleeding ulcer 2014  . GERD (gastroesophageal reflux disease)    Past Surgical History: Past Surgical History  Procedure Laterality Date  . Knee arthroscopy Left ~ 1999  . Esophagogastroduodenoscopy Left 11/26/2012    Procedure: ESOPHAGOGASTRODUODENOSCOPY (EGD);  Surgeon: Graylin Shiver, MD;  Location: [facility] ENDOSCOPY;  Service: Endoscopy;  Laterality: Left;  . Cardiovascular stress test  03/16/13    Very Poor Exercise Tolerance; NON DIAGNOSTIC TEST  . Cataract extraction w/ intraocular lens implant Left 03/07/2014    [facility] @ [facility]   Social History: History  Substance Use Topics  . Smoking status: Never Smoker   . Smokeless  tobacco: Current User    Types: Snuff  . Alcohol Use: 3.6 oz/week    6 Cans of beer per week   Additional social history: daily alcohol consumption  Please also refer to relevant sections of EMR.  Family History: No family history on file. Allergies and Medications: Allergies  Allergen Reactions  . Lisinopril Other (See Comments)    Pt reports nose bleed.   No current facility-administered medications on file prior to encounter.   Current Outpatient Prescriptions on File Prior to Encounter  Medication Sig Dispense Refill  . insulin glargine (LANTUS) 100 UNIT/ML injection Inject 0.4 mLs (40 Units total) into the skin daily. 10 mL 11  . metoprolol succinate (TOPROL-XL) 100 MG 24 hr tablet Take 1 tablet (100 mg total) by mouth daily.  Take with or immediately following a meal. 30 tablet 3  . Multiple Vitamin (MULTIVITAMIN WITH MINERALS) TABS tablet Take 1 tablet by mouth daily.    [name] azithromycin (ZITHROMAX) 250 MG tablet Take 1 tablet (250 mg total) by mouth daily. Starting on 11/20 (Patient not taking: Reported on 10/08/2014) 2 tablet 0  . clindamycin (CLEOCIN) 300 MG capsule Take 1 capsule (300 mg total) by mouth 4 (four) times daily. Starting at noon today. (Patient not taking: Reported on 10/08/2014) 20 capsule 0  . gabapentin (NEURONTIN) 300 MG capsule Take 1 capsule (300 mg total) by mouth 2 (two) times daily. (Patient not taking: Reported on 10/08/2014) 60 capsule 0  . glucose blood test strip Use as instructed 100 each 12  . insulin aspart (NOVOLOG) 100 UNIT/ML injection Inject 5-10 Units into the skin 3 (three) times daily with meals. Inject 5units with 3 meals, PLUS your previous sliding scale (Patient not taking: Reported on 10/08/2014) 10 mL 11  . LORazepam (ATIVAN) 1 MG tablet Take 1 tablet (1 mg total) by mouth every 6 (six) hours as needed for anxiety. (Patient not taking: Reported on 10/08/2014) 20 tablet 0  . losartan (COZAAR) 100 MG tablet Take 1 tablet (100 mg total) by mouth daily. (Patient not taking: Reported on 10/08/2014) 30 tablet 0  . pantoprazole (PROTONIX) 40 MG tablet Take 1 tablet (40 mg total) by mouth daily. (Patient not taking: Reported on 10/08/2014) 90 tablet 3  . pravastatin (PRAVACHOL) 80 MG tablet Take 1 tablet (80 mg total) by mouth daily. (Patient not taking: Reported on 10/08/2014) 30 tablet 3  . simethicone (MYLICON) 80 MG chewable tablet Chew 1 tablet (80 mg total) by mouth every 6 (six) hours as needed for flatulence. (Patient not taking: Reported on 08/21/2014) 30 tablet 0    Objective: BP 103/68 mmHg  Pulse 88  Temp(Src) 99.2 F (37.3 C) (Temporal)  Resp 16  Ht $R'5\' 10"'Td$  (1.778 m)  Wt 229 lb 11.2 oz (104.191 kg)  BMI 32.96 kg/m2  SpO2 93% Exam: General: NAD, lying in bed, eyes closed, awakens to  voice. Wife and daughter at bedside. HEENT: NCAT. PERRL. EOMI. Mucous membranes slightly dry. Vomitus particles in teeth. Poor dentition. Cardiovascular: RRR, no murmurs Respiratory: CTAB, NWOB Abdomen: soft, nontender to palpation in all quadrants. NABS. No organomegaly. Extremities: No appreciable lower extremity edema bilaterally  Skin: no rashes noted Neuro: oriented x 3. Follows commands. Full strength in bilat upper and lower extremities. Speech logical and coherent. EOMI, PERRL. FNF testing normal bilaterally.  Labs and Imaging: CBC BMET   Recent Labs Lab 10/08/14 0843 10/08/14 0858  WBC 8.8  --   HGB 11.0* 13.3  HCT 33.6* 39.0  PLT 172  --  Recent Labs Lab 10/08/14 0843 10/08/14 0858  NA 138 136  K 4.5 4.5  CL 91* 93*  CO2 22  --   BUN 25* 27*  CREATININE 1.39* 1.50*  GLUCOSE 422* 435*  CALCIUM 8.7  --      CT Head & c-spine 10/08/14: 1. No acute intracranial findings. While to moderate age-related atrophy. 2. No acute cervical spine abnormalities. Degenerative changes noted. 3. Right thyroid nodules. Consider further evaluation with thyroid ultrasound.  EKG sinus rhythm without signs of ischemia  Ethanol level 210  CBG [place], MD 10/08/2014, 12:34 PM PGY-3, [place] [facility] pager: [phone], text pages welcome

## 2014-10-08 NOTE — ED Notes (Signed)
Pt. Given swabs for dry mouth 

## 2014-10-08 NOTE — ED Notes (Signed)
Attempted to call report

## 2014-10-08 NOTE — ED Provider Notes (Signed)
CSN: [id number]     Arrival date & time 10/08/14  0841 History   First MD Initiated Contact with Patient 10/08/14 0848     Chief Complaint  Patient presents with  . Code Stroke  . Aphasia  . Emesis    Pt seen by myself on arrival to Pod B bridge, prior to CT at 08:48. .--M. Sabrinia Prien MD.  HPI  Patient presents presents via EMS after having a syncopal episode at home this morning.  History of hypertension diabetes gastric ulcer with hemorrhage. Off of his hypoglycemic medications for several months.  Was normal upon going to bed last night. Per wife's report to EMS she heard him fall in the bathroom. She came in and he was "lethargic" but making sounds and breathing. He complained of neck pain. She is able to help him with a great deal of difficulty getting to his bedroom. Upon arrival of paramedics he was laying supine in bed with a blood pressure of 90. When they sat him up to transfer him he had a near syncopal episode. Placed in a cervical collar. Blood sugar of 550 in route. Given 200 mL of saline IV in route. Arrives with BP of 100. Pressures (had difficulty with his speech en route. Upon arrival he is able to speak and answer questions.  Denies chest pain. Denies headache or neck pain. Cannot describe to me the events from this morning.  Past Medical History  Diagnosis Date  . Hypertension   . Blood transfusion 2014    "related to bleeding ulcer"  . Venous insufficiency 12/13/2011  . Cardiac arrest 12/07/2012  . Anxiety   . Acute renal insufficiency 12/08/2012  . High cholesterol   . OSA on CPAP 12/07/2012  . Type II diabetes mellitus   . Bleeding ulcer 2014  . GERD (gastroesophageal reflux disease)    Past Surgical History  Procedure Laterality Date  . Knee arthroscopy Left ~ 1999  . Esophagogastroduodenoscopy Left 11/26/2012    Procedure: ESOPHAGOGASTRODUODENOSCOPY (EGD);  Surgeon: Wonda Horner, MD;  Location: [facility] ENDOSCOPY;  Service: Endoscopy;  Laterality: Left;  . Cardiovascular  stress test  03/16/13    Very Poor Exercise Tolerance; NON DIAGNOSTIC TEST  . Cataract extraction w/ intraocular lens implant Left 03/07/2014    [facility] @ [facility]   No family history on file. History  Substance Use Topics  . Smoking status: Never Smoker   . Smokeless tobacco: Current User    Types: Snuff  . Alcohol Use: 3.6 oz/week    6 Cans of beer per week    Review of Systems  Constitutional: Negative for fever, chills, diaphoresis, appetite change and fatigue.  HENT: Negative for mouth sores, sore throat and trouble swallowing.   Eyes: Negative for visual disturbance.  Respiratory: Negative for cough, chest tightness, shortness of breath and wheezing.   Cardiovascular: Negative for chest pain.  Gastrointestinal: Negative for nausea, vomiting, abdominal pain, diarrhea and abdominal distention.  Endocrine: Negative for polydipsia, polyphagia and polyuria.  Genitourinary: Negative for dysuria, frequency and hematuria.  Musculoskeletal: Positive for neck pain. Negative for gait problem.  Skin: Negative for color change, pallor and rash.  Neurological: Positive for syncope, speech difficulty, weakness and light-headedness. Negative for dizziness and headaches.  Hematological: Does not bruise/bleed easily.  Psychiatric/Behavioral: Negative for behavioral problems and confusion.      Allergies  Lisinopril  Home Medications   Prior to Admission medications   Medication Sig Start Date End Date Taking? Authorizing Provider  insulin glargine (LANTUS) 100 UNIT/ML injection Inject 0.4 mLs (40 Units total) into the skin daily. 08/25/14  Yes Archie Patten, MD  metoprolol succinate (TOPROL-XL) 100 MG 24 hr tablet Take 1 tablet (100 mg total) by mouth daily. Take with or immediately following a meal. 05/20/14  Yes Bernadene Bell, MD  Multiple Vitamin (MULTIVITAMIN WITH MINERALS) TABS tablet Take 1 tablet by mouth daily.   Yes Historical Provider, MD  azithromycin  (ZITHROMAX) 250 MG tablet Take 1 tablet (250 mg total) by mouth daily. Starting on 11/20 Patient not taking: Reported on 10/08/2014 08/25/14   Archie Patten, MD  clindamycin (CLEOCIN) 300 MG capsule Take 1 capsule (300 mg total) by mouth 4 (four) times daily. Starting at noon today. Patient not taking: Reported on 10/08/2014 08/25/14   Archie Patten, MD  gabapentin (NEURONTIN) 300 MG capsule Take 1 capsule (300 mg total) by mouth 2 (two) times daily. Patient not taking: Reported on 10/08/2014 08/25/14   Archie Patten, MD  glucose blood test strip Use as instructed 08/25/14   Archie Patten, MD  insulin aspart (NOVOLOG) 100 UNIT/ML injection Inject 5-10 Units into the skin 3 (three) times daily with meals. Inject 5units with 3 meals, PLUS your previous sliding scale Patient not taking: Reported on 10/08/2014 08/25/14   Archie Patten, MD  LORazepam (ATIVAN) 1 MG tablet Take 1 tablet (1 mg total) by mouth every 6 (six) hours as needed for anxiety. Patient not taking: Reported on 10/08/2014 08/25/14   Archie Patten, MD  losartan (COZAAR) 100 MG tablet Take 1 tablet (100 mg total) by mouth daily. Patient not taking: Reported on 10/08/2014 05/20/14   Bernadene Bell, MD  pantoprazole (PROTONIX) 40 MG tablet Take 1 tablet (40 mg total) by mouth daily. Patient not taking: Reported on 10/08/2014 05/20/14   Bernadene Bell, MD  pravastatin (PRAVACHOL) 80 MG tablet Take 1 tablet (80 mg total) by mouth daily. Patient not taking: Reported on 10/08/2014 05/20/14   Bernadene Bell, MD  simethicone (MYLICON) 80 MG chewable tablet Chew 1 tablet (80 mg total) by mouth every 6 (six) hours as needed for flatulence. Patient not taking: Reported on 08/21/2014 05/20/14   Sharon [address], MD   BP 107/69 mmHg  Pulse 92  Temp(Src) 99.2 F (37.3 C) (Temporal)  Resp 23  Ht 5\' 10"  (1.778 m)  Wt 229 lb 11.2 oz (104.191 kg)  BMI 32.96 kg/m2  SpO2 96% Physical Exam  Constitutional: He is oriented to person, place,  and time. He appears well-developed and well-nourished. He appears listless. No distress.  Large stature Male. In a cervical collar and sitting  upright. Slow to answer questions but appropriate. No aphasia.  HENT:  Head: Normocephalic.  Eyes: Conjunctivae are normal. Pupils are equal, round, and reactive to light. No scleral icterus.  Neck: No thyromegaly present.  Denies pain to palpation of the posterior neck. He is in a cervical collar.  Cardiovascular: Normal rate and regular rhythm.  Exam reveals no gallop and no friction rub.   No murmur heard. Pulmonary/Chest: Effort normal and breath sounds normal. No respiratory distress. He has no wheezes. He has no rales.  Abdominal: Soft. Bowel sounds are normal. He exhibits no distension. There is no tenderness. There is no rebound.  Musculoskeletal: Normal range of motion.  Neurological: He is oriented to person, place, and time. He appears listless.  Cranial nerves intact symmetric. Normal vision without deficit. Moves all 4 extremities. Subtle weakness  4-5 out of 5 in left upper extremity. No frank pronator drift. Normal use of the remainder of the extremities.  NIH 1 for left UE weakness.  Skin: Skin is warm and dry. No rash noted.  Psychiatric: He has a normal mood and affect. His behavior is normal.    ED Course  Procedures (including critical care time) Labs Review Labs Reviewed  ETHANOL - Abnormal; Notable for the following:    Alcohol, Ethyl (B) 210 (*)    All other components within normal limits  PROTIME-INR - Abnormal; Notable for the following:    Prothrombin Time 15.7 (*)    All other components within normal limits  CBC - Abnormal; Notable for the following:    RBC 3.29 (*)    Hemoglobin 11.0 (*)    HCT 33.6 (*)    MCV 102.1 (*)    All other components within normal limits  DIFFERENTIAL - Abnormal; Notable for the following:    Neutrophils Relative % 79 (*)    All other components within normal limits  COMPREHENSIVE  METABOLIC PANEL - Abnormal; Notable for the following:    Chloride 91 (*)    Glucose, Bld 422 (*)    BUN 25 (*)    Creatinine, Ser 1.39 (*)    Albumin 2.9 (*)    AST 183 (*)    ALT 96 (*)    Alkaline Phosphatase 154 (*)    GFR calc non Af Amer 58 (*)    GFR calc Af Amer 67 (*)    Anion gap 25 (*)    All other components within normal limits  I-STAT CHEM 8, ED - Abnormal; Notable for the following:    Chloride 93 (*)    BUN 27 (*)    Creatinine, Ser 1.50 (*)    Glucose, Bld 435 (*)    Calcium, Ion 1.04 (*)    All other components within normal limits  CBG MONITORING, ED - Abnormal; Notable for the following:    Glucose-Capillary 398 (*)    All other components within normal limits  APTT  URINE RAPID DRUG SCREEN (HOSP PERFORMED)  URINALYSIS, ROUTINE W REFLEX MICROSCOPIC  I-STAT TROPOININ, ED  I-STAT TROPOININ, ED    Imaging Review Ct Head Wo Contrast  10/08/2014   CLINICAL DATA:  Express as eighth ectasia, confusion, code stroke initial evaluation  EXAM: CT HEAD WITHOUT CONTRAST  CT CERVICAL SPINE WITHOUT CONTRAST  TECHNIQUE: Multidetector CT imaging of the head and cervical spine was performed following the standard protocol without intravenous contrast. Multiplanar CT image reconstructions of the cervical spine were also generated.  COMPARISON:  None.  FINDINGS: CT HEAD FINDINGS  Mild to moderate diffuse cortical atrophy. No abnormal attenuation to suggest mass or vascular territory infarct. No hemorrhage or extra-axial fluid. No hydrocephalus. Calvarium intact. Reticular opacities throughout the left maxillary sinus suggest maxillary sinus inflammatory change.  CT CERVICAL SPINE FINDINGS  Normal alignment with no prevertebral soft tissue swelling. No fracture. Mild degenerative disc disease at C4-5 with large osteophyte anteriorly at C5-6. Moderate C6-7 degenerative disc disease. Lung apices clear. Cervical soft tissues negative. Bilateral carotid calcification. Several right thyroid  nodules the largest seen in the midpole and measuring 2 cm. Nodules are hypo attenuating with no complicating features.  IMPRESSION: 1. No acute intracranial findings. While to moderate age-related atrophy. 2. No acute cervical spine abnormalities. Degenerative changes noted. 3. Right thyroid nodules. Consider further evaluation with thyroid ultrasound.  Critical Value/emergent results were called by telephone at the  time of interpretation on 10/08/2014 at 9:22 am to Dr. Aram Beecham, who verbally acknowledged these results.   Electronically Signed   By: Skipper Cliche M.D.   On: 10/08/2014 09:21   Ct Cervical Spine Wo Contrast  10/08/2014   CLINICAL DATA:  Express as eighth ectasia, confusion, code stroke initial evaluation  EXAM: CT HEAD WITHOUT CONTRAST  CT CERVICAL SPINE WITHOUT CONTRAST  TECHNIQUE: Multidetector CT imaging of the head and cervical spine was performed following the standard protocol without intravenous contrast. Multiplanar CT image reconstructions of the cervical spine were also generated.  COMPARISON:  None.  FINDINGS: CT HEAD FINDINGS  Mild to moderate diffuse cortical atrophy. No abnormal attenuation to suggest mass or vascular territory infarct. No hemorrhage or extra-axial fluid. No hydrocephalus. Calvarium intact. Reticular opacities throughout the left maxillary sinus suggest maxillary sinus inflammatory change.  CT CERVICAL SPINE FINDINGS  Normal alignment with no prevertebral soft tissue swelling. No fracture. Mild degenerative disc disease at C4-5 with large osteophyte anteriorly at C5-6. Moderate C6-7 degenerative disc disease. Lung apices clear. Cervical soft tissues negative. Bilateral carotid calcification. Several right thyroid nodules the largest seen in the midpole and measuring 2 cm. Nodules are hypo attenuating with no complicating features.  IMPRESSION: 1. No acute intracranial findings. While to moderate age-related atrophy. 2. No acute cervical spine abnormalities.  Degenerative changes noted. 3. Right thyroid nodules. Consider further evaluation with thyroid ultrasound.  Critical Value/emergent results were called by telephone at the time of interpretation on 10/08/2014 at 9:22 am to Dr. Aram Beecham, who verbally acknowledged these results.   Electronically Signed   By: Skipper Cliche M.D.   On: 10/08/2014 09:21     EKG Interpretation None      MDM   Final diagnoses:  Syncope and collapse  Hyperglycemia  Acute kidney injury    CT scan shows no acute abdomen allergies. His last time knjown normal is greater than 12 hours. No indication for lytic therapy. He is disqualified for lytic therapy. With the hyperglycemia and hypotension likely this is likely syncope 2/2 de-hydration.  He is received additional IV fluids. His recheck BP is 120. Labs and additional studies are pending at this point.   Care discussed with family medicine residents. He is followed to their clinic. Patient will be admitted.   Tanna Furry, MD 10/08/14 1249

## 2014-10-08 NOTE — ED Notes (Signed)
Pt reminded that a urine specimen was needed and states, "ma'am I haven't had anything to drink or eat and threw up what was in my stomach this morning."

## 2014-10-08 NOTE — ED Notes (Addendum)
Family updated that pt had a new bed assignment.

## 2014-10-08 NOTE — Progress Notes (Signed)
Pt. Refuses CPAP at this time. Pt. Was made aware to inform RT or RN anytime during the night if he decides to wear CPAP.

## 2014-10-09 DIAGNOSIS — K922 Gastrointestinal hemorrhage, unspecified: Secondary | ICD-10-CM

## 2014-10-09 DIAGNOSIS — I10 Essential (primary) hypertension: Secondary | ICD-10-CM

## 2014-10-09 DIAGNOSIS — R7309 Other abnormal glucose: Secondary | ICD-10-CM | POA: Insufficient documentation

## 2014-10-09 DIAGNOSIS — G4733 Obstructive sleep apnea (adult) (pediatric): Secondary | ICD-10-CM | POA: Insufficient documentation

## 2014-10-09 DIAGNOSIS — E785 Hyperlipidemia, unspecified: Secondary | ICD-10-CM | POA: Insufficient documentation

## 2014-10-09 DIAGNOSIS — N179 Acute kidney failure, unspecified: Secondary | ICD-10-CM

## 2014-10-09 DIAGNOSIS — R55 Syncope and collapse: Secondary | ICD-10-CM

## 2014-10-09 DIAGNOSIS — D6489 Other specified anemias: Secondary | ICD-10-CM

## 2014-10-09 DIAGNOSIS — R739 Hyperglycemia, unspecified: Secondary | ICD-10-CM

## 2014-10-09 DIAGNOSIS — D638 Anemia in other chronic diseases classified elsewhere: Secondary | ICD-10-CM | POA: Insufficient documentation

## 2014-10-09 LAB — GLUCOSE, CAPILLARY
GLUCOSE-CAPILLARY: 169 mg/dL — AB (ref 70–99)
GLUCOSE-CAPILLARY: 107 mg/dL — AB (ref 70–99)
GLUCOSE-CAPILLARY: 119 mg/dL — AB (ref 70–99)
GLUCOSE-CAPILLARY: 132 mg/dL — AB (ref 70–99)
Glucose-Capillary: 101 mg/dL — ABNORMAL HIGH (ref 70–99)
Glucose-Capillary: 109 mg/dL — ABNORMAL HIGH (ref 70–99)
Glucose-Capillary: 121 mg/dL — ABNORMAL HIGH (ref 70–99)
Glucose-Capillary: 138 mg/dL — ABNORMAL HIGH (ref 70–99)
Glucose-Capillary: 163 mg/dL — ABNORMAL HIGH (ref 70–99)
Glucose-Capillary: 171 mg/dL — ABNORMAL HIGH (ref 70–99)
Glucose-Capillary: 190 mg/dL — ABNORMAL HIGH (ref 70–99)
Glucose-Capillary: 233 mg/dL — ABNORMAL HIGH (ref 70–99)
Glucose-Capillary: 248 mg/dL — ABNORMAL HIGH (ref 70–99)
Glucose-Capillary: 70 mg/dL (ref 70–99)

## 2014-10-09 LAB — CBC
HCT: 29 % — ABNORMAL LOW (ref 39.0–52.0)
HEMOGLOBIN: 9.7 g/dL — AB (ref 13.0–17.0)
MCH: 32.6 pg (ref 26.0–34.0)
MCHC: 33.4 g/dL (ref 30.0–36.0)
MCV: 97.3 fL (ref 78.0–100.0)
Platelets: 161 10*3/uL (ref 150–400)
RBC: 2.98 MIL/uL — ABNORMAL LOW (ref 4.22–5.81)
RDW: 13.6 % (ref 11.5–15.5)
WBC: 11.5 10*3/uL — AB (ref 4.0–10.5)

## 2014-10-09 LAB — COMPREHENSIVE METABOLIC PANEL
ALBUMIN: 3.1 g/dL — AB (ref 3.5–5.2)
ALK PHOS: 128 U/L — AB (ref 39–117)
ALT: 82 U/L — ABNORMAL HIGH (ref 0–53)
AST: 110 U/L — ABNORMAL HIGH (ref 0–37)
Anion gap: 9 (ref 5–15)
BUN: 36 mg/dL — AB (ref 6–23)
CALCIUM: 8.4 mg/dL (ref 8.4–10.5)
CHLORIDE: 99 meq/L (ref 96–112)
CO2: 33 mmol/L — ABNORMAL HIGH (ref 19–32)
Creatinine, Ser: 1.29 mg/dL (ref 0.50–1.35)
GFR calc Af Amer: 73 mL/min — ABNORMAL LOW (ref >90)
GFR, EST NON AFRICAN AMERICAN: 63 mL/min — AB (ref >90)
GLUCOSE: 168 mg/dL — AB (ref 70–99)
POTASSIUM: 3.8 mmol/L (ref 3.5–5.1)
Sodium: 141 mmol/L (ref 135–145)
TOTAL PROTEIN: 6.5 g/dL (ref 6.0–8.3)
Total Bilirubin: 0.7 mg/dL (ref 0.3–1.2)

## 2014-10-09 LAB — PROTIME-INR
INR: 1.15 (ref 0.00–1.49)
PROTHROMBIN TIME: 14.8 s (ref 11.6–15.2)

## 2014-10-09 LAB — HEPATITIS PANEL, ACUTE
HCV AB: NEGATIVE
Hep A IgM: NONREACTIVE
Hep B C IgM: NONREACTIVE
Hepatitis B Surface Ag: NEGATIVE

## 2014-10-09 LAB — HEMOGLOBIN A1C
HEMOGLOBIN A1C: 8.5 % — AB (ref <5.7)
Mean Plasma Glucose: 197 mg/dL — ABNORMAL HIGH (ref <117)

## 2014-10-09 LAB — OCCULT BLOOD X 1 CARD TO LAB, STOOL: FECAL OCCULT BLD: POSITIVE — AB

## 2014-10-09 LAB — TROPONIN I: Troponin I: <0.03 ng/mL (ref <0.031)

## 2014-10-09 MED ORDER — SODIUM CHLORIDE 0.9 % IV SOLN
INTRAVENOUS | Status: AC
  Administered 2014-10-09: 75 mL/h via INTRAVENOUS

## 2014-10-09 MED ORDER — INSULIN GLARGINE 100 UNIT/ML ~~LOC~~ SOLN
15.0000 [IU] | Freq: Once | SUBCUTANEOUS | Status: AC
  Administered 2014-10-09: 15 [IU] via SUBCUTANEOUS
  Filled 2014-10-09: qty 0.15

## 2014-10-09 MED ORDER — INSULIN ASPART 100 UNIT/ML ~~LOC~~ SOLN
0.0000 [IU] | SUBCUTANEOUS | Status: DC
  Administered 2014-10-09 (×3): 2 [IU] via SUBCUTANEOUS
  Administered 2014-10-09: 3 [IU] via SUBCUTANEOUS
  Administered 2014-10-09 – 2014-10-10 (×2): 2 [IU] via SUBCUTANEOUS
  Administered 2014-10-10: 5 [IU] via SUBCUTANEOUS
  Administered 2014-10-10: 8 [IU] via SUBCUTANEOUS
  Administered 2014-10-10: 2 [IU] via SUBCUTANEOUS
  Administered 2014-10-11 (×2): 5 [IU] via SUBCUTANEOUS
  Administered 2014-10-11 (×2): 3 [IU] via SUBCUTANEOUS

## 2014-10-09 NOTE — Progress Notes (Signed)
Family Medicine Teaching Service Daily Progress Note Intern Pager: [phone]  Patient name: Jeffrey [name] Medical record number: [id number] Date of birth: 04/03/1966 Age: 49 y.o. Gender: male  Primary Care Provider: Howard Pouch, DO Consultants: GI Code Status: Full Code  Pt Overview and Major Events to Date:  1/2 - Admit  Assessment and Plan: Jeffrey [name] is a 49 y.o. male presenting with syncope, hypotension, hyperglycemia, and coffee ground emesis. PMH is significant for HTN, ETOH abuse, T2DM, bleeding ulcers, and medication noncompliance. Hx also significant for PEA arrest in March 2015, thought to be secondary to combination of OSA and alcohol abuse.  # Syncope, hypotension - likely orthostatic syncope secondary to hypotension/volume depletion.  - Will continue tele monitoring. No events noted.  - Orthostatics not done (re-ordered today). - Echo to be obtained.  - TSH normal. Troponin negative x 3.  - FOBT positive; Consulting GI today.  # Anemia, coffee ground emesis - hx of GI bleed from ulcers requiring transfusion in the past. Hgb 11 on admission. baseline appears 13-14. Had EGD done in February 2014 which showed 1cm linear ulceration in distal esophagus, no gastric ulcers. - FOBT positive; Consulting GI today. - Gastrocult to be obtained. - maintain two large bore IV's - monitor closely in SDU - protonix IV BID - Will monitor closely via CBC - transfusion as needed, threshold 7.  # Hyperglycemia - T2DM, noncompliant with insulin therapy at home, reports low blood sugars in 20's-30's at home while on insulin - Patient currently off glucomander. - Currently receiving Lantus 15 units and SSI (Q4) Moderate - CBG's well controlled currently.  - Will continue to monitor closely.   # Thyroid nodule - noted on CT of cervical spine. Consider ultrasound of thyroid outpatient vs inpatient. TSH - 0.596.  # ETOH abuse: alcohol level 210 on admission. - CIWA protocol, prn  ativan - supplement with MVI, B12, folate  # AKI: Cr 1.39 on admission. Baseline around 1. Suspect prerenal etiology from dehydration. - Creatinine improving with IVF - Will continue to trend.  # Transaminitis: AST 183, ALT 96, alk phos 154 on admission.  Etiology - ETOH use vs secondary to hypotension. = - AST/ALT trending down. - Hepatitis panel pending. - Will continue to trend.  # HTN  - Patient currently normotensive. - Holding home Losartan and Metoprolol.  # HLD - on pravastatin at home. Likely noncompliant - hold statin given transaminitis  # OSA: CPAP QHS  FEN/GI: NS bolus now then NS @ 125cc/hr. Diabetic diet once passes stroke swallow screen Prophylaxis: SCD's (no heparin due to possible GI bleed)  Disposition: Pending GI evaluation.  Stable and transferring out of SDU today.  Subjective:  No overnight events. No complaints this am.  He did report dark stool.  No abdominal pain, SOB, chest pain.  Objective: Temp:  [97.3 F (36.3 C)-99.5 F (37.5 C)] 98.3 F (36.8 C) (01/03 0001) Pulse Rate:  [87-109] 91 (01/03 0001) Resp:  [10-23] 10 (01/03 0001) BP: (100-133)/(63-90) 132/83 mmHg (01/03 0001) SpO2:  [93 %-100 %] 97 % (01/02 2012) Weight:  [229 lb 11.2 oz (104.191 kg)-245 lb 13 oz (111.5 kg)] 245 lb 13 oz (111.5 kg) (01/02 1545)  Physical Exam: General: obese male resting in bed, NAD.  Cardiovascular: RRR. No m/r/g. Respiratory: CTAB. No rales, rhonchi, or wheezing.  Abdomen: soft, nontender, nondistended.  Extremities: No LE edema.   Laboratory:  Recent Labs Lab 10/08/14 1620 10/08/14 2140 10/09/14 0334  WBC 10.5 11.1* 11.5*  HGB 9.6* 10.0* 9.7*  HCT 29.6* 30.2* 29.0*  PLT 155 129* 161    Recent Labs Lab 10/08/14 0843  10/08/14 1620 10/08/14 2140 10/09/14 0334  NA 138  < > 142 141 141  K 4.5  < > 4.6 4.1 3.8  CL 91*  < > 96 99 99  CO2 22  --  25 28 33*  BUN 25*  < > 30* 35* 36*  CREATININE 1.39*  < > 1.32 1.26 1.29  CALCIUM 8.7  --   8.5 8.4 8.4  PROT 6.6  --   --   --  6.5  BILITOT 1.1  --   --   --  0.7  ALKPHOS 154*  --   --   --  128*  ALT 96*  --   --   --  82*  AST 183*  --   --   --  110*  GLUCOSE 422*  < > 274* 113* 168*  < > = values in this interval not displayed.  Imaging/Diagnostic Tests:  X-ray Chest Pa And Lateral 10/08/2014    IMPRESSION: No active cardiopulmonary disease.   Ct Cervical Spine Wo Contrast/CT Head 10/08/2014    IMPRESSION: 1. No acute intracranial findings. While to moderate age-related atrophy. 2. No acute cervical spine abnormalities. Degenerative changes noted. 3. Right thyroid nodules. Consider further evaluation with thyroid ultrasound.   Coral Spikes, DO 10/09/2014, 7:08 AM PGY-3, [place] Intern pager: [phone], text pages welcome

## 2014-10-09 NOTE — Progress Notes (Signed)
Pt transferred to 3East bed 01 per w/c with all belongings. Accompanied by Nurse tech

## 2014-10-09 NOTE — Progress Notes (Signed)
Pt admitted to rm 01 in no distress. Ciwa neg. No c/o GI disturbance. Will maintain NPO

## 2014-10-09 NOTE — Consult Note (Signed)
Consultation  Referring Provider:  Family Medicine Teaching Service    Primary Care Physician:  Howard Pouch, DO Primary Gastroenterologist:  Althia Forts     Reason for Consultation: GI bleed             HPI:   Jeffrey [name] is a 49 y.o. male admitted after syncopal episode. Wife reported finding patient on floor in bathroom. He apparently had an episode of coffee ground emesis. EMS brought patient to ED. Blood glucose in 500's, patient was having some neurologic symptoms.   Patient admitted Feb 2014 for upper GI bleed and profound anemia. He had EGD by [facility] with findings of a moderate size hiatal hernia and GEJ ulcer. Patient states he takes ibuprofen sometimes but doesn't say how often. He is having difficulty recalling many details about anything. ETOH level 210 yesterday. Patient states he has taken OTC antacids for heartburn over the last few months.    Past Medical History  Diagnosis Date  . Hypertension   . Blood transfusion 2014    "related to bleeding ulcer"  . Venous insufficiency 12/13/2011  . Cardiac arrest 12/07/2012  . Anxiety   . Acute renal insufficiency 12/08/2012  . High cholesterol   . OSA on CPAP 12/07/2012  . Type II diabetes mellitus   . Bleeding ulcer 2014  . GERD (gastroesophageal reflux disease)     Past Surgical History  Procedure Laterality Date  . Knee arthroscopy Left ~ 1999  . Esophagogastroduodenoscopy Left 11/26/2012    Procedure: ESOPHAGOGASTRODUODENOSCOPY (EGD);  Surgeon: Wonda Horner, MD;  Location: [facility] ENDOSCOPY;  Service: Endoscopy;  Laterality: Left;  . Cardiovascular stress test  03/16/13    Very Poor Exercise Tolerance; NON DIAGNOSTIC TEST  . Cataract extraction w/ intraocular lens implant Left 03/07/2014    [facility] @ [facility]    No family history on file.   History  Substance Use Topics  . Smoking status: Never Smoker   . Smokeless tobacco: Current User    Types: Snuff  . Alcohol Use: 3.6 oz/week    6 Cans of beer per  week    Prior to Admission medications   Medication Sig Start Date End Date Taking? Authorizing Provider  insulin glargine (LANTUS) 100 UNIT/ML injection Inject 0.4 mLs (40 Units total) into the skin daily. 08/25/14  Yes Archie Patten, MD  metoprolol succinate (TOPROL-XL) 100 MG 24 hr tablet Take 1 tablet (100 mg total) by mouth daily. Take with or immediately following a meal. 05/20/14  Yes Bernadene Bell, MD  Multiple Vitamin (MULTIVITAMIN WITH MINERALS) TABS tablet Take 1 tablet by mouth daily.   Yes Historical Provider, MD  azithromycin (ZITHROMAX) 250 MG tablet Take 1 tablet (250 mg total) by mouth daily. Starting on 11/20 Patient not taking: Reported on 10/08/2014 08/25/14   Archie Patten, MD  clindamycin (CLEOCIN) 300 MG capsule Take 1 capsule (300 mg total) by mouth 4 (four) times daily. Starting at noon today. Patient not taking: Reported on 10/08/2014 08/25/14   Archie Patten, MD  gabapentin (NEURONTIN) 300 MG capsule Take 1 capsule (300 mg total) by mouth 2 (two) times daily. Patient not taking: Reported on 10/08/2014 08/25/14   Archie Patten, MD  glucose blood test strip Use as instructed 08/25/14   Archie Patten, MD  insulin aspart (NOVOLOG) 100 UNIT/ML injection Inject 5-10 Units into the skin 3 (three) times daily with meals. Inject 5units with 3 meals, PLUS your previous sliding scale  Patient not taking: Reported on 10/08/2014 08/25/14   Archie Patten, MD  LORazepam (ATIVAN) 1 MG tablet Take 1 tablet (1 mg total) by mouth every 6 (six) hours as needed for anxiety. Patient not taking: Reported on 10/08/2014 08/25/14   Archie Patten, MD  losartan (COZAAR) 100 MG tablet Take 1 tablet (100 mg total) by mouth daily. Patient not taking: Reported on 10/08/2014 05/20/14   Bernadene Bell, MD  pantoprazole (PROTONIX) 40 MG tablet Take 1 tablet (40 mg total) by mouth daily. Patient not taking: Reported on 10/08/2014 05/20/14   Bernadene Bell, MD  pravastatin (PRAVACHOL) 80 MG  tablet Take 1 tablet (80 mg total) by mouth daily. Patient not taking: Reported on 10/08/2014 05/20/14   Bernadene Bell, MD  simethicone (MYLICON) 80 MG chewable tablet Chew 1 tablet (80 mg total) by mouth every 6 (six) hours as needed for flatulence. Patient not taking: Reported on 08/21/2014 05/20/14   Emmaline Kluver, MD    Current Facility-Administered Medications  Medication Dose Route Frequency Provider Last Rate Last Dose  . 0.9 %  sodium chloride infusion   Intravenous Continuous Coral Spikes, DO 75 mL/hr at 10/09/14 0816 75 mL/hr at 10/09/14 0816  . Chlorhexidine Gluconate Cloth 2 % PADS 6 each  6 each Topical Q0600 Lupita Dawn, MD   6 each at 10/09/14 [phone]  . dextrose 50 % solution 25 mL  25 mL Intravenous PRN Leeanne Rio, MD      . folic acid (FOLVITE) tablet 1 mg  1 mg Oral Daily Leeanne Rio, MD   1 mg at 10/09/14 1019  . insulin aspart (novoLOG) injection 0-15 Units  0-15 Units Subcutaneous 6 times per day Leeanne Rio, MD   2 Units at 10/09/14 [phone]  . LORazepam (ATIVAN) tablet 1 mg  1 mg Oral Q6H PRN Leeanne Rio, MD       Or  . LORazepam (ATIVAN) injection 1 mg  1 mg Intravenous Q6H PRN Leeanne Rio, MD   1 mg at 10/09/14 [id number]  . multivitamin with minerals tablet 1 tablet  1 tablet Oral Daily Leeanne Rio, MD   1 tablet at 10/09/14 1019  . mupirocin ointment (BACTROBAN) 2 % 1 application  1 application Nasal BID Lupita Dawn, MD   1 application at [id number]  . pantoprazole (PROTONIX) injection 40 mg  40 mg Intravenous Q12H Leeanne Rio, MD   40 mg at 10/09/14 1019  . sodium chloride 0.9 % injection 3 mL  3 mL Intravenous Q12H Leeanne Rio, MD   3 mL at 10/08/14 2156  . thiamine (VITAMIN B-1) tablet 100 mg  100 mg Oral Daily Leeanne Rio, MD   100 mg at 10/09/14 1019   Or  . thiamine (B-1) injection 100 mg  100 mg Intravenous Daily Leeanne Rio, MD   100 mg at 10/08/14 1757    Allergies as of  10/08/2014 - Review Complete 10/08/2014  Allergen Reaction Noted  . Lisinopril Other (See Comments) 11/30/2009    Review of Systems:    Unreliable. He "can't remember"   Physical Exam:  Vital signs in last 24 hours: Temp:  [98.3 F (36.8 C)-99.5 F (37.5 C)] 98.5 F (36.9 C) (01/03 0817) Pulse Rate:  [88-109] 92 (01/03 0817) Resp:  [10-23] 21 (01/03 0817) BP: (102-133)/(63-83) 130/80 mmHg (01/03 0817) SpO2:  [93 %-100 %] 100 % (01/03 0817) Weight:  [245 lb 13 oz (  111.5 kg)] 245 lb 13 oz (111.5 kg) (01/02 1545) Last BM Date: 10/08/14 General:   Black male in NAD Head:  Normocephalic and atraumatic. Eyes:   No icterus.   Conjunctiva pink. Ears:  Normal auditory acuity. Neck:  Supple; no masses felt Lungs:  Respirations even and unlabored. Lungs clear to auscultation bilaterally.   No wheezes, crackles, or rhonchi.  Heart:  Regular rate and rhythm Abdomen:  Soft, nondistended, nontender. Normal bowel sounds. No appreciable masses or hepatomegaly.  Msk:  Symmetrical without gross deformities.  Extremities:  Without edema. Neurologic:  Alert and  oriented x4;  grossly normal neurologically. Skin:  Intact without significant lesions or rashes. Cervical Nodes:  No significant cervical adenopathy. Psych:  Alert and cooperative. Normal affect.  LAB RESULTS:  Recent Labs  10/08/14 1620 10/08/14 2140 10/09/14 0334  WBC 10.5 11.1* 11.5*  HGB 9.6* 10.0* 9.7*  HCT 29.6* 30.2* 29.0*  PLT 155 129* 161   BMET  Recent Labs  10/08/14 1620 10/08/14 2140 10/09/14 0334  NA 142 141 141  K 4.6 4.1 3.8  CL 96 99 99  CO2 25 28 33*  GLUCOSE 274* 113* 168*  BUN 30* 35* 36*  CREATININE 1.32 1.26 1.29  CALCIUM 8.5 8.4 8.4   LFT  Recent Labs  10/09/14 0334  PROT 6.5  ALBUMIN 3.1*  AST 110*  ALT 82*  ALKPHOS 128*  BILITOT 0.7   PT/INR  Recent Labs  10/08/14 0843 10/09/14 0334  LABPROT 15.7* 14.8  INR 1.24 1.15    PREVIOUS ENDOSCOPIES:            EGD Feb 2014 -  see HPI   Impression / Plan:   56. 49 year old male with recurrent GI bleed (associated with syncopal episode), suspect upper given coffee ground emesis, black heme positive stool, elevated BUN. Rule out recurrent PUD. No further vomiting but passed black stool this am per RN.  Hgb stable  At 9.7 since admission. He will need EGD in am. Continue BID IV PPI, serial H&H checks, supportive care for now. Troponin negative.   2. Anemia of acute blood loss. Monitor and transfuse if needed.  3. ETOH abuse. On CIWA protocol  4. Uncontrolled diabetes, blood sugar improving.   5. Transaminitis, pattern c/w ETOH hepatitis.   Thanks   LOS: 1 day   Tye Savoy  10/09/2014, 11:09 AM    [facility] Attending  I have also seen and assessed the patient and agree with the above note. Says anxiety has flared and he is drinking more. Plan for egd by Dr. Fuller Plan tomorrow. The risks and benefits as well as alternatives of endoscopic procedure(s) have been discussed and reviewed. All questions answered. The patient agrees to proceed.   Gatha Mayer, MD, [facility] [phone] (pager) 10/09/2014 4:04 PM

## 2014-10-09 NOTE — Progress Notes (Signed)
Pt refuses to wear CPAP. Understands to call if he changes his mind.

## 2014-10-09 NOTE — Evaluation (Signed)
Clinical/Bedside Swallow Evaluation Patient Details  Name: Jeffrey [name] MRN: [id number] Date of Birth: [birth year]  Today's Date: 10/09/2014 Time: [id number] SLP Time Calculation (min) (ACUTE ONLY): 10 min  Past Medical History:  Past Medical History  Diagnosis Date  . Hypertension   . Blood transfusion 2014    "related to bleeding ulcer"  . Venous insufficiency 12/13/2011  . Cardiac arrest 12/07/2012  . Anxiety   . Acute renal insufficiency 12/08/2012  . High cholesterol   . OSA on CPAP 12/07/2012  . Type II diabetes mellitus   . Bleeding ulcer 2014  . GERD (gastroesophageal reflux disease)    Past Surgical History:  Past Surgical History  Procedure Laterality Date  . Knee arthroscopy Left ~ 1999  . Esophagogastroduodenoscopy Left 11/26/2012    Procedure: ESOPHAGOGASTRODUODENOSCOPY (EGD);  Surgeon: Wonda Horner, MD;  Location: [facility] ENDOSCOPY;  Service: Endoscopy;  Laterality: Left;  . Cardiovascular stress test  03/16/13    Very Poor Exercise Tolerance; NON DIAGNOSTIC TEST  . Cataract extraction w/ intraocular lens implant Left 03/07/2014    [facility] @ [facility]   HPI:  49 y.o. male with a past medical history significant for HTN, GERD, ETOH abuse, DM type II, hypercholesterolemia, OSA on CPAP, anxiety, bleeding ulcer admitted after fall, coffee ground emesis dysarthria and expressive aphasia. CT No acute intracranial findings. While to moderate age-related atrophy. CT cervical spine with right thyroid nodules. CXR No active cardiopulmonary disease. Failed RN swallow screen. BSE 12/2013 with normal swallow function, regular diet and thin liquids recommended.   Assessment / Plan / Recommendation Clinical Impression  No evidence of oral and/or pharyngeal swallow abnormality. Pt with history of GERD and ETOH increasing risk mildly. Recommend regular texture diet and thin liquids with reflux precautions. No follow up ST needed.    Aspiration Risk  Mild    Diet Recommendation  Regular;Thin liquid   Liquid Administration via: Cup;Straw Medication Administration: Whole meds with liquid Supervision: Patient able to self feed Compensations: Slow rate;Small sips/bites Postural Changes and/or Swallow Maneuvers: Seated upright 90 degrees;Upright 30-60 min after meal    Other  Recommendations Oral Care Recommendations: Oral care BID   Follow Up Recommendations  None    Frequency and Duration        Pertinent Vitals/Pain No pain         Swallow Study         Oral/Motor/Sensory Function Overall Oral Motor/Sensory Function: Appears within functional limits for tasks assessed   Ice Chips Ice chips: Not tested   Thin Liquid Thin Liquid: Within functional limits Presentation: Cup;Straw    Nectar Thick Nectar Thick Liquid: Not tested   Honey Thick Honey Thick Liquid: Not tested   Puree Puree: Within functional limits   Solid       Solid: Within functional limits       Jeffrey [name] 10/09/2014,2:04 PM  Orbie Pyo Colvin Caroli.Ed [employer] [phone]

## 2014-10-09 NOTE — Progress Notes (Signed)
[facility] Attending Note Patient seen and examined by me, discussed with resident team and I agree with their plan for today. Patient reports he has been passing melena several times overnight, no further coffee-ground emesis since admission. Has been to bedside commode without dizziness. Lacks appetite but has just been served a breakfast tray.  GLucose this AM on CMet is 168. AG closed.   Well appearing, no distress. Answers questions appropriately.  NEck supple. COR Regular S1S2 ABD Soft, nontender, nondistended with audible bowel sounds.   A/P: Patient with melena, initial presentation with c/o coffee ground emesis. Concern for GIB, likely upper GI source. Hgb stable. FOr GI consult this AM, to decide on whether patient is for endoscopic evaluation before he is given diet tray.   Patient may be transferred out of SDU today.  Dalbert Mayotte, MD

## 2014-10-09 NOTE — Progress Notes (Signed)
Called report to [place].

## 2014-10-10 ENCOUNTER — Inpatient Hospital Stay (HOSPITAL_COMMUNITY): Payer: MEDICAID | Admitting: Certified Registered Nurse Anesthetist

## 2014-10-10 ENCOUNTER — Encounter (HOSPITAL_COMMUNITY): Payer: Self-pay | Admitting: Certified Registered Nurse Anesthetist

## 2014-10-10 ENCOUNTER — Encounter (HOSPITAL_COMMUNITY)
Admission: EM | Disposition: A | Discharge status: Home / Self Care | Payer: Self-pay | Source: Home / Self Care | Attending: Family Medicine

## 2014-10-10 DIAGNOSIS — K921 Melena: Secondary | ICD-10-CM

## 2014-10-10 DIAGNOSIS — K208 Other esophagitis: Secondary | ICD-10-CM

## 2014-10-10 DIAGNOSIS — I517 Cardiomegaly: Secondary | ICD-10-CM

## 2014-10-10 DIAGNOSIS — K2210 Ulcer of esophagus without bleeding: Secondary | ICD-10-CM

## 2014-10-10 HISTORY — PX: ESOPHAGOGASTRODUODENOSCOPY: SHX5428

## 2014-10-10 LAB — CBC
HCT: 26.5 % — ABNORMAL LOW (ref 39.0–52.0)
Hemoglobin: 8.6 g/dL — ABNORMAL LOW (ref 13.0–17.0)
MCH: 31.9 pg (ref 26.0–34.0)
MCHC: 32.5 g/dL (ref 30.0–36.0)
MCV: 98.1 fL (ref 78.0–100.0)
Platelets: 155 10*3/uL (ref 150–400)
RBC: 2.7 MIL/uL — ABNORMAL LOW (ref 4.22–5.81)
RDW: 13.6 % (ref 11.5–15.5)
WBC: 10.1 10*3/uL (ref 4.0–10.5)

## 2014-10-10 LAB — GLUCOSE, CAPILLARY
GLUCOSE-CAPILLARY: 146 mg/dL — AB (ref 70–99)
GLUCOSE-CAPILLARY: 130 mg/dL — AB (ref 70–99)
GLUCOSE-CAPILLARY: 210 mg/dL — AB (ref 70–99)
Glucose-Capillary: 149 mg/dL — ABNORMAL HIGH (ref 70–99)
Glucose-Capillary: 120 mg/dL — ABNORMAL HIGH (ref 70–99)
Glucose-Capillary: 274 mg/dL — ABNORMAL HIGH (ref 70–99)

## 2014-10-10 LAB — TYPE AND SCREEN
ABO/RH(D): O POS
ANTIBODY SCREEN: NEGATIVE

## 2014-10-10 SURGERY — EGD (ESOPHAGOGASTRODUODENOSCOPY)
Anesthesia: Monitor Anesthesia Care

## 2014-10-10 MED ORDER — SODIUM CHLORIDE 0.9 % IV SOLN
INTRAVENOUS | Status: DC | PRN
  Administered 2014-10-10: 13:00:00 via INTRAVENOUS

## 2014-10-10 MED ORDER — PROMETHAZINE HCL 25 MG/ML IJ SOLN: 6.2500 mg | INTRAMUSCULAR | Status: DC | PRN

## 2014-10-10 MED ORDER — SODIUM CHLORIDE 0.9 % IV SOLN
INTRAVENOUS | Status: DC
  Administered 2014-10-10: 500 mL via INTRAVENOUS

## 2014-10-10 MED ORDER — INSULIN GLARGINE 100 UNIT/ML ~~LOC~~ SOLN
15.0000 [IU] | Freq: Every day | SUBCUTANEOUS | Status: DC
  Administered 2014-10-10: 15 [IU] via SUBCUTANEOUS
  Filled 2014-10-10 (×2): qty 0.15

## 2014-10-10 MED ORDER — MIDAZOLAM HCL 5 MG/5ML IJ SOLN
INTRAMUSCULAR | Status: DC | PRN
  Administered 2014-10-10: 2 mg via INTRAVENOUS

## 2014-10-10 MED ORDER — PROPOFOL INFUSION 10 MG/ML OPTIME
INTRAVENOUS | Status: DC | PRN
  Administered 2014-10-10: 100 ug/kg/min via INTRAVENOUS

## 2014-10-10 MED ORDER — MEPERIDINE HCL 100 MG/ML IJ SOLN: 6.2500 mg | INTRAMUSCULAR | Status: DC | PRN

## 2014-10-10 MED ORDER — FENTANYL CITRATE 0.05 MG/ML IJ SOLN: 25.0000 ug | INTRAMUSCULAR | Status: DC | PRN

## 2014-10-10 MED ORDER — BUTAMBEN-TETRACAINE-BENZOCAINE 2-2-14 % EX AERO
INHALATION_SPRAY | CUTANEOUS | Status: DC | PRN
  Administered 2014-10-10: 2 via TOPICAL

## 2014-10-10 NOTE — Anesthesia Postprocedure Evaluation (Addendum)
  Anesthesia Post-op Note  Patient: Jeffrey [name]  Procedure(s) Performed: Procedure(s): ESOPHAGOGASTRODUODENOSCOPY (EGD) (N/A)  Patient Location: PACU  Anesthesia Type:MAC  Level of Consciousness: awake  Airway and Oxygen Therapy: Patient Spontanous Breathing and Patient connected to nasal cannula oxygen  Post-op Pain: none  Post-op Assessment: Post-op Vital signs reviewed  Post-op Vital Signs: Reviewed  Last Vitals:  Filed Vitals:   10/10/14 1458  BP: 153/99  Pulse: 92  Temp: 36.9 C  Resp: 20    Complications: No apparent anesthesia complications

## 2014-10-10 NOTE — Progress Notes (Signed)
CSW order received to assist patient with obtaining d/c medications.  This is a RNCM function and CSW notified Jeffrey [name], RNCM of same.  CSW will sign off but available if needed.  Jeffrey [name]. Jeffrey [name], [place]

## 2014-10-10 NOTE — Progress Notes (Signed)
Family Medicine Teaching Service Daily Progress Note Intern Pager: [phone]  Patient name: Jeffrey [name] Medical record number: [id number] Date of birth: [birth year] Age: 49 y.o. Gender: male  Primary Care Provider: Howard Pouch, DO Consultants: GI Code Status: Full Code  Pt Overview and Major Events to Date:  1/2 - Admitted for concern of UGI bleed 1/4 - EGD today; hbg dropping   Assessment and Plan: Jeffrey [name] is a 49 y.o. male presenting with syncope, hypotension, hyperglycemia, and coffee ground emesis. PMH is significant for HTN, ETOH abuse, T2DM, bleeding ulcers, and medication noncompliance. Hx also significant for PEA arrest in March 2015, thought to be secondary to combination of OSA and alcohol abuse.  # Syncope, hypotension - likely orthostatic syncope secondary to hypotension/volume depletion.  - Will continue tele monitoring. No events noted.  - Orthostatics not done - has been ordered x2 - Echo to be obtained.  - TSH normal. Troponin negative x 3.   # Anemia, coffee ground emesis - hx of GI bleed from ulcers requiring transfusion in the past. Hgb 11 on admission. baseline appears 13-14. Had EGD done in February 2014 which showed 1cm linear ulceration in distal esophagus, no gastric ulcers. Possible source could be varices or alcohol induced esophagitis. Hbg today 8.6. - FOBT positive - patient to have EGD today -will type and screen patient - f/u GI recs - maintain two large bore IV's - monitor closely in SDU - protonix IV BID - Will monitor closely via CBC daily   - transfusion as needed, threshold 7.  # Hyperglycemia - T2DM, noncompliant with insulin therapy at home, reports low blood sugars in 20's-30's at home while on insulin - Patient currently off glucomander. - Currently receiving Lantus 15 units and SSI (Q4) Moderate - CBG's well controlled currently.  - Will continue to monitor closely.   # Thyroid nodule - noted on CT of cervical spine.  Consider ultrasound of thyroid outpatient. TSH - 0.596.  # ETOH abuse: alcohol level 210 on admission. - CIWA protocol, prn ativan - scores 0 - supplement with MVI, B12, folate  # AKI: Cr 1.39 on admission. Baseline around 1. Suspect prerenal etiology from dehydration. - Creatinine improving with IVF - Now 1.29 - Will continue to trend.  # Transaminitis: AST 183, ALT 96, alk phos 154 on admission.  Etiology - ETOH use vs secondary to hypotension.  - AST/ALT trending down. Last AST 110 / ALT 82 - Hepatitis panel negative - Will continue to trend.  # HTN  - Patient currently normotensive. - Holding home Losartan and Metoprolol.  # HLD - on pravastatin at home. Likely noncompliant - hold statin given transaminitis  # OSA: CPAP QHS -patient has been refusing to wear CPAP at night  FEN/GI: NS bolus now then NS @ 125cc/hr. Diabetic diet once passes stroke swallow screen Prophylaxis: SCD's (no heparin due to possible GI bleed)  Disposition: Pending GI evaluation.  Stable.  Subjective:  No overnight events. Patient does say stool is starting to become more brown. He was restless last night and feels more fatigues than usual. No abdominal pain, SOB, chest pain.  Objective: Temp:  [97.7 F (36.5 C)-98.5 F (36.9 C)] 98.2 F (36.8 C) (01/04 0337) Pulse Rate:  [87-97] 97 (01/04 0337) Resp:  [18-21] 18 (01/04 0337) BP: (121-140)/(77-80) 140/77 mmHg (01/04 0337) SpO2:  [99 %-100 %] 100 % (01/04 0337) Weight:  [238 lb 5.1 oz (108.1 kg)-244 lb 0.8 oz (110.7 kg)] 238 lb 5.1 oz (108.1  kg) (01/04 0537)  Physical Exam: General: obese male resting in bed, NAD.  Cardiovascular: RRR. No m/r/g. Respiratory: CTAB. No rales, rhonchi, or wheezing.  Abdomen: soft, nontender, nondistended.  Extremities: No LE edema.   Laboratory:  Recent Labs Lab 10/08/14 2140 10/09/14 0334 10/10/14 0504  WBC 11.1* 11.5* 10.1  HGB 10.0* 9.7* 8.6*  HCT 30.2* 29.0* 26.5*  PLT 129* 161 155    Recent  Labs Lab 10/08/14 0843  10/08/14 1620 10/08/14 2140 10/09/14 0334  NA 138  < > 142 141 141  K 4.5  < > 4.6 4.1 3.8  CL 91*  < > 96 99 99  CO2 22  --  25 28 33*  BUN 25*  < > 30* 35* 36*  CREATININE 1.39*  < > 1.32 1.26 1.29  CALCIUM 8.7  --  8.5 8.4 8.4  PROT 6.6  --   --   --  6.5  BILITOT 1.1  --   --   --  0.7  ALKPHOS 154*  --   --   --  128*  ALT 96*  --   --   --  82*  AST 183*  --   --   --  110*  GLUCOSE 422*  < > 274* 113* 168*  < > = values in this interval not displayed.  Imaging/Diagnostic Tests:  X-ray Chest Pa And Lateral 10/08/2014    IMPRESSION: No active cardiopulmonary disease.   Ct Cervical Spine Wo Contrast/CT Head 10/08/2014    IMPRESSION: 1. No acute intracranial findings. While to moderate age-related atrophy. 2. No acute cervical spine abnormalities. Degenerative changes noted. 3. Right thyroid nodules. Consider further evaluation with thyroid ultrasound.   Katheren Shams, DO 10/10/2014, 6:01 AM PGY-1, [facility] Intern pager: [phone], text pages welcome

## 2014-10-10 NOTE — Op Note (Signed)
[facility], [id number]   ENDOSCOPY PROCEDURE REPORT  PATIENT: Jeffrey, [name]  MR#: [id number] BIRTHDATE: [birth year] , 51  yrs. old GENDER: male ENDOSCOPIST: Ladene Artist, MD, [facility] REFERRED BY:  [facility], unassigned PROCEDURE DATE:  10/10/2014 PROCEDURE:  EGD, diagnostic ASA CLASS:     Class III INDICATIONS:  hematemesis and melena. MEDICATIONS: Monitored anesthesia care and Per Anesthesia TOPICAL ANESTHETIC: Cetacaine Spray DESCRIPTION OF PROCEDURE: After the risks benefits and alternatives of the procedure were thoroughly explained, informed consent was obtained.  The Pentax Gastroscope [id number] endoscope was introduced through the mouth and advanced to the second portion of the duodenum , Without limitations.  The instrument was slowly withdrawn as the mucosa was fully examined.    ESOPHAGUS: There was LA Class C esophagitis (Mucosal breaks continuous between > 2 mucosal folds, but involving less than 75% of the esophageal circumference) noted.   The esophagus was otherwise normal. STOMACH: The mucosa of the stomach appeared normal. DUODENUM: Mild non erosive duodenal inflammation was found in the 2nd part of the duodenum.   The duodenal mucosa showed no abnormalities in the duodenal bulb.  Retroflexed views revealed a 5 cm hiatal hernia.     The scope was then withdrawn from the patient and the procedure completed.  COMPLICATIONS: There were no immediate complications.  ENDOSCOPIC IMPRESSION: 1.   LA Class C esophagitis 2.   5 cm hiatal hernia 3.   Mild duodenitis  RECOMMENDATIONS: 1.  Anti-reflux regimen long term 2.  PPI bid for 1 month the PPI qam long term 3.  DC etoh use  eSigned:  Ladene Artist, MD, [facility] 10/10/2014 1:20 PM

## 2014-10-10 NOTE — Progress Notes (Signed)
Inpatient Diabetes Program Recommendations  AACE/ADA: New Consensus Statement on Inpatient Glycemic Control (2013)  Target Ranges:  Prepandial:   less than 140 mg/dL      Peak postprandial:   less than 180 mg/dL (1-2 hours)      Critically ill patients:  140 - 180 mg/dL  Results for WEI, UEHARA (MRN [id number]) as of 10/10/2014 12:33  Ref. Range 10/09/2014 20:55 10/09/2014 23:31 10/10/2014 03:26 10/10/2014 07:58 10/10/2014 10:48  Glucose-Capillary Latest Range: 70-99 mg/dL 163 (H) 121 (H) 149 (H) 146 (H) 130 (H)   Consult received for "brittle diabetes".  Pt is well known to the Inpatient Diabetes Program.  Diabetes Coordinator recommended a more affordable insulin last November admission 2015.  [employer] ReliOn Novolin 70/30 is approximately $25 a vial.  Currently glucose is controlled on Lantus and Novolog.   Will follow. Thank you  Raoul Pitch BSN, RN,CDE Inpatient Diabetes Coordinator [phone] (team pager)

## 2014-10-10 NOTE — Interval H&P Note (Signed)
History and Physical Interval Note:  10/10/2014 12:33 PM  Jeffrey [name]  has presented today for surgery, with the diagnosis of upper gastrointestinal bleeding  The various methods of treatment have been discussed with the patient and family. After consideration of risks, benefits and other options for treatment, the patient has consented to  Procedure(s): ESOPHAGOGASTRODUODENOSCOPY (EGD) (N/A) as a surgical intervention .  The patient's history has been reviewed, patient examined, no change in status, stable for surgery.  I have reviewed the patient's chart and labs.  Questions were answered to the patient's satisfaction.     Pricilla Riffle. Fuller Plan MD

## 2014-10-10 NOTE — Progress Notes (Signed)
Echocardiogram 2D Echocardiogram has been performed.  Jeffrey [name] 10/10/2014, 2:32 PM

## 2014-10-10 NOTE — Progress Notes (Signed)
Pt. Refused cpap. RT informed pt. To notify if he changes his mind. 

## 2014-10-10 NOTE — Transfer of Care (Signed)
Immediate Anesthesia Transfer of Care Note  Patient: Jeffrey [name]  Procedure(s) Performed: Procedure(s): ESOPHAGOGASTRODUODENOSCOPY (EGD) (N/A)  Patient Location: PACU and Endoscopy Unit  Anesthesia Type:MAC  Level of Consciousness: awake, alert  and patient cooperative  Airway & Oxygen Therapy: Patient Spontanous Breathing and Patient connected to nasal cannula oxygen  Post-op Assessment: Report given to PACU RN, Post -op Vital signs reviewed and stable and Patient moving all extremities  Post vital signs: Reviewed and stable  Complications: No apparent anesthesia complications

## 2014-10-10 NOTE — Anesthesia Preprocedure Evaluation (Signed)
Anesthesia Evaluation  Patient identified by MRN, date of birth, ID band Patient awake    Reviewed: Allergy & Precautions, NPO status , Patient's Chart, lab work & pertinent test results, reviewed documented beta blocker date and time   Airway        Dental   Pulmonary sleep apnea and Continuous Positive Airway Pressure Ventilation ,          Cardiovascular hypertension, Pt. on medications  ECHO 2014 EF 55-60%   Neuro/Psych Anxiety    GI/Hepatic PUD, GERD-  ,  Endo/Other  diabetes, Well Controlled, Type 2  Renal/GU GFR 76     Musculoskeletal   Abdominal   Peds  Hematology 8.6/26 H/H   Anesthesia Other Findings   Reproductive/Obstetrics                             Anesthesia Physical Anesthesia Plan  ASA: III  Anesthesia Plan: MAC   Post-op Pain Management:    Induction: Intravenous  Airway Management Planned: Nasal Cannula  Additional Equipment:   Intra-op Plan:   Post-operative Plan:   Informed Consent: I have reviewed the patients History and Physical, chart, labs and discussed the procedure including the risks, benefits and alternatives for the proposed anesthesia with the patient or authorized representative who has indicated his/her understanding and acceptance.     Plan Discussed with:   Anesthesia Plan Comments:         Anesthesia Quick Evaluation

## 2014-10-10 NOTE — H&P (View-Only) (Signed)
Consultation  Referring Provider:  Family Medicine Teaching Service    Primary Care Physician:  Howard Pouch, DO Primary Gastroenterologist:  Althia Forts     Reason for Consultation: GI bleed             HPI:   Jeffrey [name] is a 49 y.o. male admitted after syncopal episode. Wife reported finding patient on floor in bathroom. He apparently had an episode of coffee ground emesis. EMS brought patient to ED. Blood glucose in 500's, patient was having some neurologic symptoms.   Patient admitted Feb 2014 for upper GI bleed and profound anemia. He had EGD by [facility] with findings of a moderate size hiatal hernia and GEJ ulcer. Patient states he takes ibuprofen sometimes but doesn't say how often. He is having difficulty recalling many details about anything. ETOH level 210 yesterday. Patient states he has taken OTC antacids for heartburn over the last few months.    Past Medical History  Diagnosis Date  . Hypertension   . Blood transfusion 2014    "related to bleeding ulcer"  . Venous insufficiency 12/13/2011  . Cardiac arrest 12/07/2012  . Anxiety   . Acute renal insufficiency 12/08/2012  . High cholesterol   . OSA on CPAP 12/07/2012  . Type II diabetes mellitus   . Bleeding ulcer 2014  . GERD (gastroesophageal reflux disease)     Past Surgical History  Procedure Laterality Date  . Knee arthroscopy Left ~ 1999  . Esophagogastroduodenoscopy Left 11/26/2012    Procedure: ESOPHAGOGASTRODUODENOSCOPY (EGD);  Surgeon: Wonda Horner, MD;  Location: [facility] ENDOSCOPY;  Service: Endoscopy;  Laterality: Left;  . Cardiovascular stress test  03/16/13    Very Poor Exercise Tolerance; NON DIAGNOSTIC TEST  . Cataract extraction w/ intraocular lens implant Left 03/07/2014    Groat @ [facility]    No family history on file.   History  Substance Use Topics  . Smoking status: Never Smoker   . Smokeless tobacco: Current User    Types: Snuff  . Alcohol Use: 3.6 oz/week    6 Cans of beer per  week    Prior to Admission medications   Medication Sig Start Date End Date Taking? Authorizing Provider  insulin glargine (LANTUS) 100 UNIT/ML injection Inject 0.4 mLs (40 Units total) into the skin daily. 08/25/14  Yes Archie Patten, MD  metoprolol succinate (TOPROL-XL) 100 MG 24 hr tablet Take 1 tablet (100 mg total) by mouth daily. Take with or immediately following a meal. 05/20/14  Yes Bernadene Bell, MD  Multiple Vitamin (MULTIVITAMIN WITH MINERALS) TABS tablet Take 1 tablet by mouth daily.   Yes Historical Provider, MD  azithromycin (ZITHROMAX) 250 MG tablet Take 1 tablet (250 mg total) by mouth daily. Starting on 11/20 Patient not taking: Reported on 10/08/2014 08/25/14   Archie Patten, MD  clindamycin (CLEOCIN) 300 MG capsule Take 1 capsule (300 mg total) by mouth 4 (four) times daily. Starting at noon today. Patient not taking: Reported on 10/08/2014 08/25/14   Archie Patten, MD  gabapentin (NEURONTIN) 300 MG capsule Take 1 capsule (300 mg total) by mouth 2 (two) times daily. Patient not taking: Reported on 10/08/2014 08/25/14   Archie Patten, MD  glucose blood test strip Use as instructed 08/25/14   Archie Patten, MD  insulin aspart (NOVOLOG) 100 UNIT/ML injection Inject 5-10 Units into the skin 3 (three) times daily with meals. Inject 5units with 3 meals, PLUS your previous sliding scale  Patient not taking: Reported on 10/08/2014 08/25/14   Archie Patten, MD  LORazepam (ATIVAN) 1 MG tablet Take 1 tablet (1 mg total) by mouth every 6 (six) hours as needed for anxiety. Patient not taking: Reported on 10/08/2014 08/25/14   Archie Patten, MD  losartan (COZAAR) 100 MG tablet Take 1 tablet (100 mg total) by mouth daily. Patient not taking: Reported on 10/08/2014 05/20/14   Bernadene Bell, MD  pantoprazole (PROTONIX) 40 MG tablet Take 1 tablet (40 mg total) by mouth daily. Patient not taking: Reported on 10/08/2014 05/20/14   Bernadene Bell, MD  pravastatin (PRAVACHOL) 80 MG  tablet Take 1 tablet (80 mg total) by mouth daily. Patient not taking: Reported on 10/08/2014 05/20/14   Bernadene Bell, MD  simethicone (MYLICON) 80 MG chewable tablet Chew 1 tablet (80 mg total) by mouth every 6 (six) hours as needed for flatulence. Patient not taking: Reported on 08/21/2014 05/20/14   Emmaline Kluver, MD    Current Facility-Administered Medications  Medication Dose Route Frequency Provider Last Rate Last Dose  . 0.9 %  sodium chloride infusion   Intravenous Continuous Coral Spikes, DO 75 mL/hr at 10/09/14 0816 75 mL/hr at 10/09/14 0816  . Chlorhexidine Gluconate Cloth 2 % PADS 6 each  6 each Topical Q0600 Lupita Dawn, MD   6 each at 10/09/14 [phone]  . dextrose 50 % solution 25 mL  25 mL Intravenous PRN Leeanne Rio, MD      . folic acid (FOLVITE) tablet 1 mg  1 mg Oral Daily Leeanne Rio, MD   1 mg at 10/09/14 1019  . insulin aspart (novoLOG) injection 0-15 Units  0-15 Units Subcutaneous 6 times per day Leeanne Rio, MD   2 Units at 10/09/14 [phone]  . LORazepam (ATIVAN) tablet 1 mg  1 mg Oral Q6H PRN Leeanne Rio, MD       Or  . LORazepam (ATIVAN) injection 1 mg  1 mg Intravenous Q6H PRN Leeanne Rio, MD   1 mg at 10/09/14 [id number]  . multivitamin with minerals tablet 1 tablet  1 tablet Oral Daily Leeanne Rio, MD   1 tablet at 10/09/14 1019  . mupirocin ointment (BACTROBAN) 2 % 1 application  1 application Nasal BID Lupita Dawn, MD   1 application at [phone]  . pantoprazole (PROTONIX) injection 40 mg  40 mg Intravenous Q12H Leeanne Rio, MD   40 mg at 10/09/14 1019  . sodium chloride 0.9 % injection 3 mL  3 mL Intravenous Q12H Leeanne Rio, MD   3 mL at 10/08/14 2156  . thiamine (VITAMIN B-1) tablet 100 mg  100 mg Oral Daily Leeanne Rio, MD   100 mg at 10/09/14 1019   Or  . thiamine (B-1) injection 100 mg  100 mg Intravenous Daily Leeanne Rio, MD   100 mg at 10/08/14 1757    Allergies as of  10/08/2014 - Review Complete 10/08/2014  Allergen Reaction Noted  . Lisinopril Other (See Comments) 11/30/2009    Review of Systems:    Unreliable. He "can't remember"   Physical Exam:  Vital signs in last 24 hours: Temp:  [98.3 F (36.8 C)-99.5 F (37.5 C)] 98.5 F (36.9 C) (01/03 0817) Pulse Rate:  [88-109] 92 (01/03 0817) Resp:  [10-23] 21 (01/03 0817) BP: (102-133)/(63-83) 130/80 mmHg (01/03 0817) SpO2:  [93 %-100 %] 100 % (01/03 0817) Weight:  [245 lb 13 oz (  111.5 kg)] 245 lb 13 oz (111.5 kg) (01/02 1545) Last BM Date: 10/08/14 General:   Black male in NAD Head:  Normocephalic and atraumatic. Eyes:   No icterus.   Conjunctiva pink. Ears:  Normal auditory acuity. Neck:  Supple; no masses felt Lungs:  Respirations even and unlabored. Lungs clear to auscultation bilaterally.   No wheezes, crackles, or rhonchi.  Heart:  Regular rate and rhythm Abdomen:  Soft, nondistended, nontender. Normal bowel sounds. No appreciable masses or hepatomegaly.  Msk:  Symmetrical without gross deformities.  Extremities:  Without edema. Neurologic:  Alert and  oriented x4;  grossly normal neurologically. Skin:  Intact without significant lesions or rashes. Cervical Nodes:  No significant cervical adenopathy. Psych:  Alert and cooperative. Normal affect.  LAB RESULTS:  Recent Labs  10/08/14 1620 10/08/14 2140 10/09/14 0334  WBC 10.5 11.1* 11.5*  HGB 9.6* 10.0* 9.7*  HCT 29.6* 30.2* 29.0*  PLT 155 129* 161   BMET  Recent Labs  10/08/14 1620 10/08/14 2140 10/09/14 0334  NA 142 141 141  K 4.6 4.1 3.8  CL 96 99 99  CO2 25 28 33*  GLUCOSE 274* 113* 168*  BUN 30* 35* 36*  CREATININE 1.32 1.26 1.29  CALCIUM 8.5 8.4 8.4   LFT  Recent Labs  10/09/14 0334  PROT 6.5  ALBUMIN 3.1*  AST 110*  ALT 82*  ALKPHOS 128*  BILITOT 0.7   PT/INR  Recent Labs  10/08/14 0843 10/09/14 0334  LABPROT 15.7* 14.8  INR 1.24 1.15    PREVIOUS ENDOSCOPIES:            EGD Feb 2014 -  see HPI   Impression / Plan:   69. 49 year old male with recurrent GI bleed (associated with syncopal episode), suspect upper given coffee ground emesis, black heme positive stool, elevated BUN. Rule out recurrent PUD. No further vomiting but passed black stool this am per RN.  Hgb stable  At 9.7 since admission. He will need EGD in am. Continue BID IV PPI, serial H&H checks, supportive care for now. Troponin negative.   2. Anemia of acute blood loss. Monitor and transfuse if needed.  3. ETOH abuse. On CIWA protocol  4. Uncontrolled diabetes, blood sugar improving.   5. Transaminitis, pattern c/w ETOH hepatitis.   Thanks   LOS: 1 day   Tye Savoy  10/09/2014, 11:09 AM    [facility] Attending  I have also seen and assessed the patient and agree with the above note. Says anxiety has flared and he is drinking more. Plan for egd by Dr. Fuller Plan tomorrow. The risks and benefits as well as alternatives of endoscopic procedure(s) have been discussed and reviewed. All questions answered. The patient agrees to proceed.   Gatha Mayer, MD, [facility] [phone] (pager) 10/09/2014 4:04 PM

## 2014-10-10 NOTE — Progress Notes (Signed)
Patient back from EGD procedure. Patient alert and oriented x4 in no distress. Patient has no complaints at this time. Will continue to monitor.

## 2014-10-10 NOTE — Care Management Note (Unsigned)
    Page 1 of 1   10/10/2014     2:03:31 PM CARE MANAGEMENT NOTE 10/10/2014  Patient:  Jeffrey [name], Jeffrey [name]   Account Number:  [id number]  Date Initiated:  10/10/2014  Documentation initiated by:  Jakwon Gayton  Subjective/Objective Assessment:   Pt adm on 10/08/13 with syncope, UGI bleeding.  PTA, pt independent, lives with spouse and children.  He has no insurance and is not working.  PCP is [facility].     Action/Plan:   Pt states he has difficulty affording his medications. Neither he nor his wife are working, but his 3 children are on Medicaid, and this is how the family supports themselves.   Anticipated DC Date:  10/11/2014   Anticipated DC Plan:  [place]  CM consult  Medication Assistance      Choice offered to / List presented to:             Status of service:  In process, will continue to follow Medicare Important Message given?  NO (If response is "NO", the following Medicare IM given date fields will be blank) Date Medicare IM given:   Medicare IM given by:   Date Additional Medicare IM given:   Additional Medicare IM given by:    Discharge Disposition:    Per UR Regulation:  Reviewed for med. necessity/level of care/duration of stay  If discussed at [place] of Stay Meetings, dates discussed:    Comments:  10/10/13 Ellan Lambert, RN, BSN [phone] Pt states he is working with a case manager at the [facility] clinic to assist him with applying for disability/med assistance.  Pt has used [facility] MATCH program within the last year, so he is not eligible for assist at this time.  Pt given resources for [employer] and [email or web address] assistance programs that may be able to assist him, if he meets eligibility guidelines.  Would encourage prescribing physician to consider low cost generics if possible-pt uses [facility], and they have a large list of $4 generics.

## 2014-10-11 ENCOUNTER — Encounter: Payer: Self-pay | Admitting: Pharmacist

## 2014-10-11 ENCOUNTER — Encounter (HOSPITAL_COMMUNITY): Payer: Self-pay | Admitting: Gastroenterology

## 2014-10-11 DIAGNOSIS — W102XXA Fall (on)(from) incline, initial encounter: Secondary | ICD-10-CM

## 2014-10-11 DIAGNOSIS — E119 Type 2 diabetes mellitus without complications: Secondary | ICD-10-CM

## 2014-10-11 LAB — CBC
HCT: 27.4 % — ABNORMAL LOW (ref 39.0–52.0)
HEMOGLOBIN: 9 g/dL — AB (ref 13.0–17.0)
MCH: 33.5 pg (ref 26.0–34.0)
MCHC: 32.8 g/dL (ref 30.0–36.0)
MCV: 101.9 fL — AB (ref 78.0–100.0)
Platelets: 159 10*3/uL (ref 150–400)
RBC: 2.69 MIL/uL — AB (ref 4.22–5.81)
RDW: 13.5 % (ref 11.5–15.5)
WBC: 6.4 10*3/uL (ref 4.0–10.5)

## 2014-10-11 LAB — GLUCOSE, CAPILLARY
Glucose-Capillary: 152 mg/dL — ABNORMAL HIGH (ref 70–99)
Glucose-Capillary: 164 mg/dL — ABNORMAL HIGH (ref 70–99)
Glucose-Capillary: 247 mg/dL — ABNORMAL HIGH (ref 70–99)
Glucose-Capillary: 380 mg/dL — ABNORMAL HIGH (ref 70–99)

## 2014-10-11 MED ORDER — PANTOPRAZOLE SODIUM 40 MG PO TBEC: 40.0000 mg | DELAYED_RELEASE_TABLET | Freq: Two times a day (BID) | ORAL | Status: DC

## 2014-10-11 MED ORDER — INSULIN GLARGINE 100 UNIT/ML ~~LOC~~ SOLN: 20.0000 [IU] | Freq: Every day | SUBCUTANEOUS | Status: DC

## 2014-10-11 MED ORDER — PRAVASTATIN SODIUM 80 MG PO TABS: 80.0000 mg | ORAL_TABLET | Freq: Every day | ORAL | Status: DC

## 2014-10-11 MED ORDER — PANTOPRAZOLE SODIUM 40 MG PO TBEC
40.0000 mg | DELAYED_RELEASE_TABLET | Freq: Two times a day (BID) | ORAL | Status: DC
  Administered 2014-10-11: 40 mg via ORAL
  Filled 2014-10-11: qty 1

## 2014-10-11 MED ORDER — METOPROLOL SUCCINATE ER 100 MG PO TB24: 100.0000 mg | ORAL_TABLET | Freq: Every day | ORAL | Status: DC

## 2014-10-11 MED ORDER — INSULIN GLARGINE 100 UNIT/ML ~~LOC~~ SOLN
17.0000 [IU] | Freq: Every day | SUBCUTANEOUS | Status: DC
  Administered 2014-10-11: 17 [IU] via SUBCUTANEOUS
  Filled 2014-10-11: qty 0.17

## 2014-10-11 NOTE — Assessment & Plan Note (Signed)
Patient admitted with diabetes. Currently out of insulin, provided one vial of insulin to patient. Lot number [id number] exp: 07/06/2016.  Current dose anticipated 17 units daily. Patient to follow-up in clinic in the next 30 days.

## 2014-10-11 NOTE — Progress Notes (Signed)
Pt alert and responsive. Skin warm and dry. Oral mucosa pink and moist. Denies pain and discomfort. Complained of anxiety earlier this shift. Ativan was given PRN.

## 2014-10-11 NOTE — Progress Notes (Signed)
   Subjective:    Patient ID: Jeffrey [name], male    DOB: 06-27-1966, 49 y.o.   MRN: [id number]  HPI    Review of Systems     Objective:   Physical Exam        Assessment & Plan:  Patient admitted with diabetes. Currently out of insulin, provided one vial of insulin to patient. Lot number [id number] exp: 07/06/2016.  Current dose anticipated 17 units daily. Patient to follow-up in clinic in the next 30 days.

## 2014-10-11 NOTE — Discharge Summary (Signed)
[facility] Discharge Summary  Patient name: Jeffrey [name] Medical record number: [id number] Date of birth: Oct 02, 1966 Age: 49 y.o. Gender: male Date of Admission: 10/08/2014  Date of Discharge: 10/11/2014 Admitting Physician: Lupita Dawn, MD  Primary Care Provider: Howard Pouch, DO Consultants: GI  Indication for Hospitalization: Hyperglycemia and UGI  Discharge Diagnoses/Problem List:   Patient Active Problem List   Diagnosis Date Noted  . Gastrointestinal hemorrhage with melena 10/10/2014  . Erosive esophagitis 10/10/2014  . OSA (obstructive sleep apnea)   . Essential hypertension   . HLD (hyperlipidemia)   . Anemia due to other cause   . Acute kidney failure, unspecified   . Other abnormal glucose   . Obstructive sleep apnea (adult) (pediatric)   . Syncope 10/08/2014  . Acute kidney injury   . Hyperglycemia   . Syncope and collapse   . CAP (community acquired pneumonia) 08/30/2014  . SOB (shortness of breath)   . Pneumonia, organism unspecified   . Diabetic ketoacidosis without coma associated with type 1 diabetes mellitus   . Hematemesis with nausea   . Leukocytosis   . Cough   . Dehydration 05/18/2014  . Hypovolemia dehydration 05/18/2014  . DKA (diabetic ketoacidoses) 01/04/2014  . OSA on CPAP 12/28/2012  . Acute renal insufficiency 12/08/2012  . Pulmonary edema 12/08/2012  . Hypoglycemia 12/07/2012  . Cardiac arrest 12/07/2012  . Diarrhea 12/07/2012  . Obesity 12/07/2012  . Anoxic brain injury 12/07/2012  . Acute respiratory failure 12/06/2012  . GI bleed 11/26/2012  . Venous insufficiency 12/13/2011  . Leg wound, left 12/03/2011  . INSOMNIA, CHRONIC 02/14/2009  . 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 ALCOHOL ABUSE, HX OF 11/10/2007  . ERECTILE DYSFUNCTION 04/14/2007  . DM type 2 goal A1C below 7.5 12/04/2006  . HYPERCHOLESTEROLEMIA 12/04/2006  . OBESITY, NOS 12/04/2006  . ANXIETY 12/04/2006  .  HYPERTENSION, BENIGN SYSTEMIC 12/04/2006  . ARTHRITIS 12/04/2006    Disposition: Home  Discharge Condition: Improved  Discharge Exam:  Filed Vitals:   10/11/14 1051  BP: 129/69  Pulse: 107  Temp:   Resp: 20   General: NAD, sitting at side of bed. Pleasant and cooperative. Obese. HEENT: NCAT. PERRL. EOMI. Mucous membranes moist. Cardiovascular: RRR, no murmurs Respiratory: CTAB, NWOB Abdomen: soft, nontender to palpation in all quadrants. NABS. No organomegaly. Extremities: No appreciable lower extremity edema bilaterally  Skin: no rashes noted  Brief Hospital Course:  Jeffrey [name] is a 49 y.o. male who presented with syncope, hypotension, hyperglycemia, and coffee ground emesis. PMH is significant for HTN, ETOH abuse, T2DM, bleeding ulcers, and medication noncompliance. Hx also significant for PEA arrest in March 2015, thought to be secondary to combination of OSA and alcohol abuse.  His hospital course by problem is listed below:  #Syncope, hypotension - Likely orthostatic syncope secondary to hypotension/volume depletion.  Echo performed on 1/4 showed EF 60-65%; aorta with calcified density in the descending aorta (atheroma) and cardiology recommended outpatient chest and abdominal CT angio for further evaluation. Troponins were trended and were negative. Patient experienced no further episodes of syncope during hospital admission.   # Anemia, coffee ground emesis and melena: Patient has hx of GI bleed from ulcers requiring transfusion in the past. Hgb 11 on admission. Baseline appears 13-14. Had EGD done in February 2014 which showed 1cm linear ulceration in distal esophagus, no gastric ulcers. FOBT positive. GI was consulted and patient had and EGD that revealed Class C esophagitis, 5 cm  hiatal hernia, and mild duodenitis. This appears to be the source of his bleeding leading to anemia. GI recommended anti-reflux regimen long term. He is to continue PPI bid for 1 month then  PPI qam long term. He was encouraged to discontinue EtOH use as this was causing his problems. Patient did not require transfusion and Hbg was trending upward at discharge.  # Hyperglycemia - He has hx of T2DM and noncompliant with insulin therapy at home. Reported low blood sugars in 20's-30's at home while on insulin so discontinued it. A1c 8.5 this admission. He was initally started on glucomander for elevated sugars (high 398) and elevated AG  with normal HCO2. Sugars were better controlled and he was transitioned to Lantus and moderate SSI. At time of discharge patient instructed to only take 20U of Lantus and stop taking Novolog. He was given insulin from clinic to go home with since he is unable to afford at time of discharge. Patient was seen by case management to help with aquiring medication.   #Thyroid nodule was noted on CT of cervical spine. Consider ultrasound of thyroid outpatient. TSH was 0.596.    # ETOH abuse: Alcohol level 210 on admission. CIWA protocol was initiated with scores of 0. Patient was counseled on alcohol cessation several times.   # AKI: Cr 1.39 on admission. Baseline around 1. Suspected prerenal etiology from dehydration. Creatinine improved with IVF.  # Transaminitis: AST 183, ALT 96, alk phos 154 on admission. Etiology was ETOH use vs secondary to hypotension. Levels trended down during admission. Hepatitis panel was negative.   # HTN: Patient remained normotensive.  # HLD: On pravastatin at home but per med rec patient was not taking at home. We held it in the hospital due given transaminitis present on admission. Will restart at discharge.   #OSA: CPAP QHS ordered but patient refused to wear CPAP   Issues for Follow Up:  1. Please follow-up patient's blood sugars. He may require adjustment in insulin regimen.  2. Monitor Hbg and get repeat CBC to monitor for continued bleeding. 3. Will need outpatient CT angio of aorta for atheroma on Echo. (Cardiology  suggested) 4. Follow-up patient's ability to receive medications as cost has been a problem. Services provided in hospital.  5. Thyroid nodule noted on CT of cervical spine. Consider ultrasound of thyroid outpatient.  6. Continue to encourage alcohol cessation.  Significant Procedures: EGD  Significant Labs and Imaging:   Recent Labs Lab 10/09/14 0334 10/10/14 0504 10/11/14 0356  WBC 11.5* 10.1 6.4  HGB 9.7* 8.6* 9.0*  HCT 29.0* 26.5* 27.4*  PLT 161 155 159    Recent Labs Lab 10/08/14 0843 10/08/14 0858 10/08/14 1620 10/08/14 2140 10/09/14 0334  NA 138 136 142 141 141  K 4.5 4.5 4.6 4.1 3.8  CL 91* 93* 96 99 99  CO2 22  --  25 28 33*  GLUCOSE 422* 435* 274* 113* 168*  BUN 25* 27* 30* 35* 36*  CREATININE 1.39* 1.50* 1.32 1.26 1.29  CALCIUM 8.7  --  8.5 8.4 8.4  ALKPHOS 154*  --   --   --  128*  AST 183*  --   --   --  110*  ALT 96*  --   --   --  82*  ALBUMIN 2.9*  --   --   --  3.1*    Ct Head Wo Contrast 10/08/2014  IMPRESSION: 1. No acute intracranial findings. While to moderate age-related atrophy. 2. No acute  cervical spine abnormalities. Degenerative changes noted. 3. Right thyroid nodules. Consider further evaluation with thyroid ultrasound.  Critical Value/emergent results were called by telephone at the time of interpretation on 10/08/2014 at 9:22 am to Dr. Aram Beecham, who verbally acknowledged these results.   Electronically Signed   By: Skipper Cliche M.D.   On: 10/08/2014 09:21   Results/Tests Pending at Time of Discharge: None  Discharge Medications:    Medication List    STOP taking these medications        azithromycin 250 MG tablet  Commonly known as:  ZITHROMAX     clindamycin 300 MG capsule  Commonly known as:  CLEOCIN     insulin aspart 100 UNIT/ML injection  Commonly known as:  novoLOG      TAKE these medications        gabapentin 300 MG capsule  Commonly known as:  NEURONTIN  Take 1 capsule (300 mg total) by mouth 2 (two) times daily.      glucose blood test strip  Use as instructed     insulin glargine 100 UNIT/ML injection  Commonly known as:  LANTUS  Inject 0.2 mLs (20 Units total) into the skin daily.     LORazepam 1 MG tablet  Commonly known as:  ATIVAN  Take 1 tablet (1 mg total) by mouth every 6 (six) hours as needed for anxiety.     losartan 100 MG tablet  Commonly known as:  COZAAR  Take 1 tablet (100 mg total) by mouth daily.     metoprolol succinate 100 MG 24 hr tablet  Commonly known as:  TOPROL-XL  Take 1 tablet (100 mg total) by mouth daily. Take with or immediately following a meal.     multivitamin with minerals Tabs tablet  Take 1 tablet by mouth daily.     pantoprazole 40 MG tablet  Commonly known as:  PROTONIX  Take 1 tablet (40 mg total) by mouth 2 (two) times daily. Take one tab twice daily for a month then take one tab daily.     pravastatin 80 MG tablet  Commonly known as:  PRAVACHOL  Take 1 tablet (80 mg total) by mouth daily.     simethicone 80 MG chewable tablet  Commonly known as:  MYLICON  Chew 1 tablet (80 mg total) by mouth every 6 (six) hours as needed for flatulence.        Discharge Instructions: Please refer to Patient Instructions section of EMR for full details.  Patient was counseled important signs and symptoms that should prompt return to medical care, changes in medications, dietary instructions, activity restrictions, and follow up appointments.   Follow-Up Appointments:  Follow-up Information    Follow up with Howard Pouch, DO On 10/24/2014.   Specialty:  Family Medicine   Why:  _0  for hospital follow-up   Contact information:   [place] [address] [phone]       Katheren Shams, DO 10/11/2014, 4:44 PM PGY-1, [place]

## 2014-10-11 NOTE — Clinical Documentation Improvement (Signed)
  A cause and effect relationship may not be assumed and must be documented by a provider  Please document the cause of the patient's GI Bleed:   - GI Bleed 2/2 LA Class C Esophagitis and mild Duodenitis   - Other Cause   - Unable to Clinically Determine    Thank you, Erling Conte ,RN Clinical Documentation Specialist:  [phone] [facility]

## 2014-10-11 NOTE — Discharge Instructions (Signed)
Discharge Date: 10/11/2014  Reason for Hospitalization: High blood sugars and bleeding  We were able to get your sugars under control. You will need to take only your Lantus 20U daily and stop Novolog. Please make sure to follow up at your scheduled appointment below. We need to monitor your sugars closely to help avoid any hypoglycemia or hyperglycemia. You were set up with support in the community to help you pay for your medications. Also of note  You were bleeding this admission. It was noted that you had some inflammation of your throat and stomach that was causing some bleeding. It is important that you stop drinking alcohol to avoid complications. To help manage this you need to take a medicine called Protonix (see below).  A calcification of your aorta was noticed on your heart echo. You willl need to get further imaging that will be set up at your PCP appointment. Please take your Pravastatin to help with this.   New medications:  Protonix please take one tab twice daily for 30 days and then take one tab there after.   Thank you for letting us participate in your care!

## 2014-10-11 NOTE — Progress Notes (Signed)
Discharge instructions completed. Patient verbalizes understanding. Denies pain and discomfort.

## 2014-10-24 ENCOUNTER — Inpatient Hospital Stay: Payer: Self-pay | Admitting: Family Medicine

## 2014-11-16 ENCOUNTER — Inpatient Hospital Stay (HOSPITAL_COMMUNITY)
Admission: EM | Admit: 2014-11-16 | Discharge: 2014-11-18 | DRG: 378 | Disposition: A | Discharge status: Home / Self Care | Payer: Self-pay | Attending: Family Medicine | Admitting: Family Medicine

## 2014-11-16 ENCOUNTER — Encounter (HOSPITAL_COMMUNITY): Payer: Self-pay | Admitting: Emergency Medicine

## 2014-11-16 DIAGNOSIS — E1140 Type 2 diabetes mellitus with diabetic neuropathy, unspecified: Secondary | ICD-10-CM

## 2014-11-16 DIAGNOSIS — R110 Nausea: Secondary | ICD-10-CM

## 2014-11-16 DIAGNOSIS — Z9989 Dependence on other enabling machines and devices: Secondary | ICD-10-CM

## 2014-11-16 DIAGNOSIS — Z6837 Body mass index (BMI) 37.0-37.9, adult: Secondary | ICD-10-CM

## 2014-11-16 DIAGNOSIS — Z9842 Cataract extraction status, left eye: Secondary | ICD-10-CM

## 2014-11-16 DIAGNOSIS — F1010 Alcohol abuse, uncomplicated: Secondary | ICD-10-CM

## 2014-11-16 DIAGNOSIS — E875 Hyperkalemia: Secondary | ICD-10-CM | POA: Diagnosis present

## 2014-11-16 DIAGNOSIS — Z961 Presence of intraocular lens: Secondary | ICD-10-CM | POA: Diagnosis present

## 2014-11-16 DIAGNOSIS — A047 Enterocolitis due to Clostridium difficile: Secondary | ICD-10-CM | POA: Diagnosis present

## 2014-11-16 DIAGNOSIS — M1990 Unspecified osteoarthritis, unspecified site: Secondary | ICD-10-CM | POA: Diagnosis present

## 2014-11-16 DIAGNOSIS — G4700 Insomnia, unspecified: Secondary | ICD-10-CM | POA: Diagnosis present

## 2014-11-16 DIAGNOSIS — F419 Anxiety disorder, unspecified: Secondary | ICD-10-CM | POA: Diagnosis present

## 2014-11-16 DIAGNOSIS — R000 Tachycardia, unspecified: Secondary | ICD-10-CM

## 2014-11-16 DIAGNOSIS — E785 Hyperlipidemia, unspecified: Secondary | ICD-10-CM | POA: Diagnosis present

## 2014-11-16 DIAGNOSIS — K920 Hematemesis: Secondary | ICD-10-CM | POA: Diagnosis present

## 2014-11-16 DIAGNOSIS — E119 Type 2 diabetes mellitus without complications: Secondary | ICD-10-CM | POA: Diagnosis present

## 2014-11-16 DIAGNOSIS — K2210 Ulcer of esophagus without bleeding: Secondary | ICD-10-CM | POA: Diagnosis present

## 2014-11-16 DIAGNOSIS — K208 Other esophagitis: Secondary | ICD-10-CM

## 2014-11-16 DIAGNOSIS — Z794 Long term (current) use of insulin: Secondary | ICD-10-CM

## 2014-11-16 DIAGNOSIS — K2991 Gastroduodenitis, unspecified, with bleeding: Secondary | ICD-10-CM

## 2014-11-16 DIAGNOSIS — I10 Essential (primary) hypertension: Secondary | ICD-10-CM | POA: Diagnosis present

## 2014-11-16 DIAGNOSIS — K219 Gastro-esophageal reflux disease without esophagitis: Secondary | ICD-10-CM | POA: Diagnosis present

## 2014-11-16 DIAGNOSIS — Z79899 Other long term (current) drug therapy: Secondary | ICD-10-CM

## 2014-11-16 DIAGNOSIS — F1021 Alcohol dependence, in remission: Secondary | ICD-10-CM

## 2014-11-16 DIAGNOSIS — G4733 Obstructive sleep apnea (adult) (pediatric): Secondary | ICD-10-CM | POA: Diagnosis present

## 2014-11-16 DIAGNOSIS — D62 Acute posthemorrhagic anemia: Secondary | ICD-10-CM | POA: Diagnosis present

## 2014-11-16 DIAGNOSIS — R109 Unspecified abdominal pain: Secondary | ICD-10-CM | POA: Diagnosis present

## 2014-11-16 DIAGNOSIS — E860 Dehydration: Secondary | ICD-10-CM | POA: Diagnosis present

## 2014-11-16 DIAGNOSIS — E861 Hypovolemia: Secondary | ICD-10-CM | POA: Diagnosis present

## 2014-11-16 DIAGNOSIS — E669 Obesity, unspecified: Secondary | ICD-10-CM | POA: Diagnosis present

## 2014-11-16 DIAGNOSIS — K921 Melena: Principal | ICD-10-CM | POA: Diagnosis present

## 2014-11-16 DIAGNOSIS — K922 Gastrointestinal hemorrhage, unspecified: Secondary | ICD-10-CM | POA: Diagnosis present

## 2014-11-16 LAB — COMPREHENSIVE METABOLIC PANEL
ALBUMIN: 2.8 g/dL — AB (ref 3.5–5.2)
ALK PHOS: 141 U/L — AB (ref 39–117)
ALT: 77 U/L — AB (ref 0–53)
ANION GAP: 5 (ref 5–15)
AST: 152 U/L — ABNORMAL HIGH (ref 0–37)
BILIRUBIN TOTAL: 1.2 mg/dL (ref 0.3–1.2)
BUN: 13 mg/dL (ref 6–23)
CHLORIDE: 97 mmol/L (ref 96–112)
CO2: 35 mmol/L — ABNORMAL HIGH (ref 19–32)
Calcium: 7.9 mg/dL — ABNORMAL LOW (ref 8.4–10.5)
Creatinine, Ser: 1.03 mg/dL (ref 0.50–1.35)
GFR calc Af Amer: >90 mL/min (ref >90)
GFR calc non Af Amer: 83 mL/min — ABNORMAL LOW (ref >90)
Glucose, Bld: 129 mg/dL — ABNORMAL HIGH (ref 70–99)
Potassium: 5.4 mmol/L — ABNORMAL HIGH (ref 3.5–5.1)
Sodium: 137 mmol/L (ref 135–145)
Total Protein: 6.7 g/dL (ref 6.0–8.3)

## 2014-11-16 LAB — CBC WITH DIFFERENTIAL/PLATELET
BASOS PCT: 1 % (ref 0–1)
Basophils Absolute: 0.1 10*3/uL (ref 0.0–0.1)
EOS PCT: 1 % (ref 0–5)
Eosinophils Absolute: 0.1 10*3/uL (ref 0.0–0.7)
HEMATOCRIT: 32.7 % — AB (ref 39.0–52.0)
Hemoglobin: 10.4 g/dL — ABNORMAL LOW (ref 13.0–17.0)
LYMPHS PCT: 13 % (ref 12–46)
Lymphs Abs: 1 10*3/uL (ref 0.7–4.0)
MCH: 28.9 pg (ref 26.0–34.0)
MCHC: 31.8 g/dL (ref 30.0–36.0)
MCV: 90.8 fL (ref 78.0–100.0)
Monocytes Absolute: 0.8 10*3/uL (ref 0.1–1.0)
Monocytes Relative: 10 % (ref 3–12)
NEUTROS ABS: 5.6 10*3/uL (ref 1.7–7.7)
NEUTROS PCT: 75 % (ref 43–77)
PLATELETS: 199 10*3/uL (ref 150–400)
RBC: 3.6 MIL/uL — ABNORMAL LOW (ref 4.22–5.81)
RDW: 17.9 % — ABNORMAL HIGH (ref 11.5–15.5)
WBC: 7.5 10*3/uL (ref 4.0–10.5)

## 2014-11-16 LAB — POTASSIUM: POTASSIUM: 5.9 mmol/L — AB (ref 3.5–5.1)

## 2014-11-16 LAB — ETHANOL: ALCOHOL ETHYL (B): 5 mg/dL (ref 0–9)

## 2014-11-16 LAB — LIPASE, BLOOD: Lipase: 17 U/L (ref 11–59)

## 2014-11-16 MED ORDER — SODIUM CHLORIDE 0.9 % IV SOLN
Freq: Once | INTRAVENOUS | Status: AC
  Administered 2014-11-16: 23:00:00 via INTRAVENOUS

## 2014-11-16 MED ORDER — SODIUM CHLORIDE 0.9 % IV BOLUS (SEPSIS)
500.0000 mL | Freq: Once | INTRAVENOUS | Status: AC
  Administered 2014-11-16: 500 mL via INTRAVENOUS

## 2014-11-16 MED ORDER — SODIUM POLYSTYRENE SULFONATE 15 GM/60ML PO SUSP
30.0000 g | Freq: Once | ORAL | Status: AC
  Administered 2014-11-16: 30 g via ORAL
  Filled 2014-11-16: qty 120

## 2014-11-16 NOTE — ED Notes (Signed)
Pt to bathroom via wheelchair.

## 2014-11-16 NOTE — H&P (Signed)
[facility] Admission History and Physical Service Pager: [phone]  Patient name: Jeffrey [name] Medical record number: [id number] Date of birth: 08-Jun-1966 Age: 49 y.o. Gender: male  Primary Care Provider: Howard Pouch, DO Consultants: None Code Status: Full  Chief Complaint: Abdominal Pain, GI Bleed  Assessment and Plan: Jeffrey [name] is a 49 y.o. male presenting with abdominal pain and GI bleed, noting blood in both stool and vomitus. PMH is significant for HTN, alcohol abuse, DM type 2, bleeding ulcers, and medication noncompliance. History of PEA arrest in March 2015 secondary to OSA and alcohol abuse.  # GI Bleed: Endoscopy in January 2016 showed class C esophagitis (mucosal breaks that are continuous between the tops of two or more mucosal folds, but which involve less that 75% of the esophageal circumference), 5cm hiatal hernia, and mild duodenitis. FOBT positive. Home medication Protonix 40mg , which Jeffrey [name] reports not taking due to cost. - Follow up CBC, PT, INR, PTT in am - Consult GI in am - NPO except for sips with medications - Type & screen, 2 large-bore IVs.  - Protonix 40mg . Cannot afford at discharge. Give coupon prior to discharge.  - Acetaminophen PRN pain (< 3 g per day), NSAIDs contraindicated - Phenergan PRN nausea (QT prolongation) - No pharmacologic VTE prophylaxis. SCDs. - Care Management consulted to aid with affording medications - PT/OT  # Sinus tachycardia: Likely due to dehydration/anemia. HR 90-150 in ED, with higher heart rates with ambulation or changes in position. O2 sat 96-100%. EKG sinus tachycardia. Decreased PO intake recently and appears hypovolemic on exam. Denies chest pain or shortness of breath. Well's Score 1.5 (tachycardia) so low risk for PE. Received 1.5L bolus of NS total in ED. - F/U Orthostatic vital signs - Consider PE in differential, however low likelihood based on Wells Score. Will obtain D  dimer if shortness of breath or hypoxia develops.  - Give an additional 1L bolus of NS - Continue to monitor - Cycle cardiac enzymes to rule out NSTEMI/demand ischemia.  # Acute blood loss anemia: Baseline hemoglobin 13-14. EGD in February 2014 showed 1cm linear ulceration in distal esophagus, no gastric ulcers. EGD in January 2016 showed class C esophagitis, 5cm hiatal hernia, and mild duodenitis. GI was consulted during this hospitalization and recommended long term PPI. Hemoglobin 10.4 in ED, up from 9 at previous hospitalization. Suspect may be hemoconcentrated due to dehydration. FOBT positive. - Follow up CBC in am - Continue to monitor - Transfuse if less than 7  # Hyperkalemia- Potassium 5.4>5.9 in ED. Kayexalate 30g given in ED. EKG with sinus tachycardia. Not entirely certain of etiology: Possibly hemolyzed samples or medication-related (?metoprolol), denies taking cozaar. No acidemia (in fact, bicarbonate is elevated likely related to emesis). No significant ECG findings indicating use of calcium gluconate, insulin/dextrose. Suspect resolution with rehydration given preserved renal function.  - Follow up troponin - Follow up am CMP, Magnesium - Follow up am EKG - Replete as indicated in am  # Type 2 Diabetes- CBG 129 in ED. Home medication Lantus 20 Units, however admits to giving 10-30units depending on what blood sugars are. Hypoglycemic on 2/10 with sugars in 30s at home. Does not eat regular meals at home. - Lantus 10U, Moderate Insulin Sliding Scale - Will monitor amount of Novolog needed and adjust Lantus dose as indicated. - Diabetes coordinator - Continue to counsel on importance of regular eating habits and taking medication as prescribed.  # Transaminitis- Alcohol-related pattern: AST 152, ALT  77, Alkaline Phosphatase 141. No other offending agents identified. Synthetic function remains intact. MELD only 9 (based on INR from 10/09/14).  - Follow up CMP in am - Continue to  monitor  # History of alcohol abuse: Denies excessive EtOH for the past several months and EtOH negative in ED, but had EtOH of 210 at previous admission. No signs of acute intoxication nor withdrawal at this admission.  - CIWA protocol - Thiamine, folate - Rapid UDS  # Anxiety- Home Medication Ativan 1mg  q6hr PRN - Continue home medications  # Hypertension- Home medication Toprol 100mg  (Not taking Losartan 100mg  as listed on medication list) - Continue home medications  # Obstructive Sleep Apnea- History of refusing CPAP in past - Will order CPAP nightly.   FEN/GI: NPO except for Ice Chips, 2L bolus NS Prophylaxis: SCDs (Pharmacologic Prophylaxis contraindicated due to bleed)  Disposition: Place in Observation, [facility] attending. Discharge pending GI consult.  History of Present Illness: Jeffrey [name] is a 49 y.o. male presenting with one day history of bloody vomitus and bloody stools. Abdominal pain started at approximately 7am and vomiting started shortly after. States pain is constant, but improved with bowel movements. Pain is located in LLQ. He denies taking any medication for the pain. Denies recent history of NSAID use for pain, but states he did start taking Aspirin 81mg  again last week. States he was unable to afford this medication for the past three months, but was able to start last week. Describes two episodes of dark stools with some red blood mixed in. Describes two episodes of dark red emesis. Denies fevers. Denies association between food and pain. Denies blood in urine. Admits to dizziness with change in position. Admits to decreased oral intake recently.  Reports episode of hypoglycemia with sugars in the 30s on 11/16/14. States he took Lantus 30units at lunchtime on 2/9 when his sugar was 110 at the time. States he was anticipating going to a party that day so he took an increased dose, however he ended up not going to the party. Reports he normally takes between  10-20units daily, however he may skip a day if his sugar is low. States he changes his dose of Lantus on his own. States he is a "spontaneous eater" and has not eaten regular meals in three years. He is unable to quantify the average number of meals he eats in a day. States he gets sick if he tries to force himself to eat regularly.  Recent admission in January 2016, where he presented with syncope, hypotension, and coffee ground emesis. Endoscopy at that time showed class C esophagitis (mucosal breaks that are continuous between the tops of two or more mucosal folds, but which involve less that 75% of the esophageal circumference), 5cm hiatal hernia, and mild duodenitis. Was instructed to take Protonix daily at discharge, but states he cannot afford this medication and has been unable to take it.   States only medications he is able to afford is Metoprolol and Insulin. Has not been taking any other medications except for Aspirin 81mg . Did not take any medications on 11/16/14. Has history of alcohol abuse with prior hospitalizations. 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 States he can no longer afford alcohol and that he has stopped hanging out with his old friends that were alcoholics. States he drank heavily from high school up until 2015 with his cardiac arrest, drinking  3-4 gallons of alcohol a week with his friends.  Review Of Systems: Per HPI  Otherwise 12 point review of systems was performed and was unremarkable.  Patient Active Problem List   Diagnosis Date Noted  . Gastrointestinal hemorrhage with melena 10/10/2014  . Erosive esophagitis 10/10/2014  . OSA (obstructive sleep apnea)   . Essential hypertension   . HLD (hyperlipidemia)   . Anemia due to other cause   . Acute kidney failure, unspecified   . Other abnormal glucose   . Obstructive sleep apnea (adult) (pediatric)   . Syncope 10/08/2014  . Acute  kidney injury   . Hyperglycemia   . Syncope and collapse   . CAP (community acquired pneumonia) 08/30/2014  . SOB (shortness of breath)   . Pneumonia, organism unspecified   . Diabetic ketoacidosis without coma associated with type 1 diabetes mellitus   . Hematemesis with nausea   . Leukocytosis   . Cough   . Dehydration 05/18/2014  . Hypovolemia dehydration 05/18/2014  . DKA (diabetic ketoacidoses) 01/04/2014  . OSA on CPAP 12/28/2012  . Acute renal insufficiency 12/08/2012  . Pulmonary edema 12/08/2012  . Hypoglycemia 12/07/2012  . Cardiac arrest 12/07/2012  . Diarrhea 12/07/2012  . Obesity 12/07/2012  . Anoxic brain injury 12/07/2012  . Acute respiratory failure 12/06/2012  . GI bleed 11/26/2012  . Venous insufficiency 12/13/2011  . Leg wound, left 12/03/2011  . INSOMNIA, CHRONIC 02/14/2009  . 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 ALCOHOL ABUSE, HX OF 11/10/2007  . ERECTILE DYSFUNCTION 04/14/2007  . DM type 2 goal A1C below 7.5 12/04/2006  . HYPERCHOLESTEROLEMIA 12/04/2006  . OBESITY, NOS 12/04/2006  . ANXIETY 12/04/2006  . HYPERTENSION, BENIGN SYSTEMIC 12/04/2006  . ARTHRITIS 12/04/2006   Past Medical History: Past Medical History  Diagnosis Date  . Hypertension   . Blood transfusion 2014    "related to bleeding ulcer"  . Venous insufficiency 12/13/2011  . Cardiac arrest 12/07/2012  . Anxiety   . Acute renal insufficiency 12/08/2012  . High cholesterol   . OSA on CPAP 12/07/2012  . Type II diabetes mellitus   . Bleeding ulcer 2014  . GERD (gastroesophageal reflux disease)    Past Surgical History: Past Surgical History  Procedure Laterality Date  . Knee arthroscopy Left ~ 1999  . Esophagogastroduodenoscopy Left 11/26/2012    Procedure: ESOPHAGOGASTRODUODENOSCOPY (EGD);  Surgeon: Wonda Horner, MD;  Location: [facility] ENDOSCOPY;  Service: Endoscopy;  Laterality: Left;  . Cardiovascular stress test  03/16/13    Very Poor Exercise Tolerance; NON  DIAGNOSTIC TEST  . Cataract extraction w/ intraocular lens implant Left 03/07/2014    [place] @ [facility]  . Esophagogastroduodenoscopy N/A 10/10/2014    Procedure: ESOPHAGOGASTRODUODENOSCOPY (EGD);  Surgeon: Ladene Artist, MD;  Location: [facility] ENDOSCOPY;  Service: Endoscopy;  Laterality: N/A;   Social History: History  Substance Use Topics  . Smoking status: Never Smoker   . Smokeless tobacco: Current User    Types: Snuff  . Alcohol Use: 3.6 oz/week    6 Cans of beer per week   Please also refer to relevant sections of EMR.  Family History: No family history on file. Allergies and Medications: Allergies  Allergen Reactions  . Lisinopril Other (See Comments)    Pt reports nose bleed.   No current facility-administered medications on file prior to encounter.   Current Outpatient Prescriptions on File Prior to Encounter  Medication Sig Dispense Refill  . insulin glargine (LANTUS) 100 UNIT/ML  injection Inject 0.2 mLs (20 Units total) into the skin daily. (Patient taking differently: Inject 17-30 Units into the skin daily. ) 10 mL 11  . metoprolol succinate (TOPROL-XL) 100 MG 24 hr tablet Take 1 tablet (100 mg total) by mouth daily. Take with or immediately following a meal. 30 tablet 0  . gabapentin (NEURONTIN) 300 MG capsule Take 1 capsule (300 mg total) by mouth 2 (two) times daily. (Patient not taking: Reported on 10/08/2014) 60 capsule 0  . glucose blood test strip Use as instructed 100 each 12  . LORazepam (ATIVAN) 1 MG tablet Take 1 tablet (1 mg total) by mouth every 6 (six) hours as needed for anxiety. (Patient not taking: Reported on 10/08/2014) 20 tablet 0  . losartan (COZAAR) 100 MG tablet Take 1 tablet (100 mg total) by mouth daily. (Patient not taking: Reported on 10/08/2014) 30 tablet 0  . pantoprazole (PROTONIX) 40 MG tablet Take 1 tablet (40 mg total) by mouth 2 (two) times daily. Take one tab twice daily for a month then take one tab daily. (Patient not taking:  Reported on 11/16/2014) 60 tablet 1  . pravastatin (PRAVACHOL) 80 MG tablet Take 1 tablet (80 mg total) by mouth daily. (Patient not taking: Reported on 11/16/2014) 30 tablet 3  . simethicone (MYLICON) 80 MG chewable tablet Chew 1 tablet (80 mg total) by mouth every 6 (six) hours as needed for flatulence. (Patient not taking: Reported on 08/21/2014) 30 tablet 0    Objective: BP 125/77 mmHg  Pulse 94  Temp(Src) 98.6 F (37 C) (Oral)  Resp 20  Ht 5\' 10"  (1.778 m)  Wt 260 lb (117.935 kg)  BMI 37.31 kg/m2  SpO2 100% Exam: General: 49yo male resting comfortably in no apparent distress, dizzy with changes in position HEENT: Dry mucous membranes Cardiovascular: S1 and S2 noted. No murmurs noted. Tachycardic, worse with changes in position. cap refill ~ 2-3 sec. Respiratory: Clear to auscultation bilaterally. No wheezes noted. No increased work of breathing. On room air. Abdomen: Bowel sounds noted. Soft and nondistended. Mild generalized tenderness with some focus in LLQ. Negative Murphy's. Negative rebound tenderness. Negative Rovsing's.  Extremities: No edema noted. Cold extremities. Pulses palpated. Skin: No rashes noted. No acanthosis.   Neuro: No focal deficits. AAOx4.  Labs and Imaging: CBC BMET   Recent Labs Lab 11/16/14 1915  WBC 7.5  HGB 10.4*  HCT 32.7*  PLT 199    Recent Labs Lab 11/16/14 1915 11/16/14 2137  NA 137  --   K 5.4* 5.9*  CL 97  --   CO2 35*  --   BUN 13  --   CREATININE 1.03  --   GLUCOSE 129*  --   CALCIUM 7.9*  --     [address], DO 11/16/2014, 11:28 PM PGY-1, [place] [facility] pager: [phone], text pages welcome    I have seen and evaluated the patient with Dr. Gerlean Ren. I am in agreement with the note above in its revised form. My additions are in red.  Rivkah Wolz B. Bonner Puna, MD, PGY-2 11/17/2014 2:11 AM

## 2014-11-16 NOTE — ED Notes (Signed)
Hemoccult collected, light brown stool collected, no bright red or dark red blood seen

## 2014-11-16 NOTE — ED Notes (Signed)
Pt to ED via [facility] with c/o generalized abd pain with nausea and vomiting.  Pt st;s he was dx with a ulcer Jan, 2 2016.  St's resumed taking ASA 1 week ago.  Today has had 2 episodes of dark red emesis and 1 dark red stool

## 2014-11-16 NOTE — ED Provider Notes (Signed)
CSN: [id number]     Arrival date & time 11/16/14  1849 History   First MD Initiated Contact with Patient 11/16/14 1858     Chief Complaint  Patient presents with  . Abdominal Pain     (Consider location/radiation/quality/duration/timing/severity/associated sxs/prior Treatment) HPI Jeffrey [name] is a 49 y.o. male who presents for evaluation of abdominal pain associated with nausea and vomiting, which has contained bright red blood.  He also noticed dark stool this morning.  He also has abdominal pain, diffuse, which started this morning.  He states that he was recently hospitalized, and had some intestinal bleeding.  He is not sure of the exact diagnosis or treatment that was given to him, but states he is taking medications for it.  He denies chest pain, back pain, weakness, dizziness, paresthesias, or change in urinary habits.  There are no other known modifying factors.    Past Medical History  Diagnosis Date  . Hypertension   . Blood transfusion 2014    "related to bleeding ulcer"  . Venous insufficiency 12/13/2011  . Cardiac arrest 12/07/2012  . Anxiety   . Acute renal insufficiency 12/08/2012  . High cholesterol   . OSA on CPAP 12/07/2012  . Type II diabetes mellitus   . Bleeding ulcer 2014  . GERD (gastroesophageal reflux disease)    Past Surgical History  Procedure Laterality Date  . Knee arthroscopy Left ~ 1999  . Esophagogastroduodenoscopy Left 11/26/2012    Procedure: ESOPHAGOGASTRODUODENOSCOPY (EGD);  Surgeon: Wonda Horner, MD;  Location: [facility] ENDOSCOPY;  Service: Endoscopy;  Laterality: Left;  . Cardiovascular stress test  03/16/13    Very Poor Exercise Tolerance; NON DIAGNOSTIC TEST  . Cataract extraction w/ intraocular lens implant Left 03/07/2014    [place] @ [facility]  . Esophagogastroduodenoscopy N/A 10/10/2014    Procedure: ESOPHAGOGASTRODUODENOSCOPY (EGD);  Surgeon: Ladene Artist, MD;  Location: [facility] ENDOSCOPY;  Service: Endoscopy;  Laterality: N/A;   No  family history on file. History  Substance Use Topics  . Smoking status: Never Smoker   . Smokeless tobacco: Current User    Types: Snuff  . Alcohol Use: 3.6 oz/week    6 Cans of beer per week    Review of Systems  All other systems reviewed and are negative.     Allergies  Lisinopril  Home Medications   Prior to Admission medications   Medication Sig Start Date End Date Taking? Authorizing Provider  gabapentin (NEURONTIN) 300 MG capsule Take 1 capsule (300 mg total) by mouth 2 (two) times daily. Patient not taking: Reported on 10/08/2014 08/25/14   Archie Patten, MD  glucose blood test strip Use as instructed 08/25/14   Archie Patten, MD  insulin glargine (LANTUS) 100 UNIT/ML injection Inject 0.2 mLs (20 Units total) into the skin daily. 10/11/14   Katheren Shams, DO  LORazepam (ATIVAN) 1 MG tablet Take 1 tablet (1 mg total) by mouth every 6 (six) hours as needed for anxiety. Patient not taking: Reported on 10/08/2014 08/25/14   Archie Patten, MD  losartan (COZAAR) 100 MG tablet Take 1 tablet (100 mg total) by mouth daily. Patient not taking: Reported on 10/08/2014 05/20/14   Bernadene Bell, MD  metoprolol succinate (TOPROL-XL) 100 MG 24 hr tablet Take 1 tablet (100 mg total) by mouth daily. Take with or immediately following a meal. 10/11/14   Katheren Shams, DO  Multiple Vitamin (MULTIVITAMIN WITH MINERALS) TABS tablet Take 1 tablet by mouth daily.  Historical Provider, MD  pantoprazole (PROTONIX) 40 MG tablet Take 1 tablet (40 mg total) by mouth 2 (two) times daily. Take one tab twice daily for a month then take one tab daily. 10/11/14   Katheren Shams, DO  pravastatin (PRAVACHOL) 80 MG tablet Take 1 tablet (80 mg total) by mouth daily. 10/11/14   Katheren Shams, DO  simethicone (MYLICON) 80 MG chewable tablet Chew 1 tablet (80 mg total) by mouth every 6 (six) hours as needed for flatulence. Patient not taking: Reported on 08/21/2014 05/20/14   Sharon [address], MD   BP  125/77 mmHg  Pulse 94  Temp(Src) 98.6 F (37 C) (Oral)  Resp 20  Ht 5\' 10"  (1.778 m)  Wt 260 lb (117.935 kg)  BMI 37.31 kg/m2  SpO2 100% Physical Exam  Constitutional: He is oriented to person, place, and time. He appears well-developed and well-nourished.  HENT:  Head: Normocephalic and atraumatic.  Right Ear: External ear normal.  Left Ear: External ear normal.  Eyes: Conjunctivae and EOM are normal. Pupils are equal, round, and reactive to light.  Neck: Normal range of motion and phonation normal. Neck supple.  Cardiovascular: Normal rate, regular rhythm and normal heart sounds.   Pulmonary/Chest: Effort normal and breath sounds normal. He exhibits no bony tenderness.  Abdominal: Soft. There is tenderness (diffuse, mild).  Musculoskeletal: Normal range of motion.  Neurological: He is alert and oriented to person, place, and time. No cranial nerve deficit or sensory deficit. He exhibits normal muscle tone. Coordination normal.  Skin: Skin is warm, dry and intact.  Psychiatric: His behavior is normal. Judgment and thought content normal.  He is anxious  Nursing note and vitals reviewed.   ED Course  Procedures (including critical care time)  Medications  sodium chloride 0.9 % bolus 500 mL (0 mLs Intravenous Stopped 11/16/14 2131)  sodium polystyrene (KAYEXALATE) 15 GM/60ML suspension 30 g (30 g Oral Given 11/16/14 2318)  0.9 %  sodium chloride infusion ( Intravenous New Bag/Given 11/16/14 2317)    Patient Vitals for the past 24 hrs:  BP Temp Temp src Pulse Resp SpO2 Height Weight  11/16/14 2215 125/77 mmHg - - 94 20 100 % - -  11/16/14 1856 139/81 mmHg 98.6 F (37 C) Oral 94 18 96 % 5\' 10"  (1.778 m) 260 lb (117.935 kg)  11/16/14 1854 139/81 mmHg 98.6 F (37 C) Oral 99 18 97 % - -    11:17 PM Reevaluation with update and discussion. After initial assessment and treatment, an updated evaluation reveals patient is complaining of upper abdominal pain at this time and would  like something for it.  Of note, he walked to the bathroom, returned and shortly after that had an EKG done at 23:10.  Heart rate at bedtime was 151.  At this time, the heart rate is 107 11/07/2010 and he is not short of breath or confused.  1 L saline bolus ordered. Ilanna Deihl L   11:24 PM-Consult complete with [facility] resident. Patient case explained and discussed. She agrees to admit patient for further evaluation and treatment. Call ended at [place] - Abnormal; Notable for the following:    Potassium 5.4 (*)    CO2 35 (*)    Glucose, Bld 129 (*)    Calcium 7.9 (*)    Albumin 2.8 (*)    AST 152 (*)    ALT 77 (*)    Alkaline Phosphatase 141 (*)  GFR calc non Af Amer 83 (*)    All other components within normal limits  CBC WITH DIFFERENTIAL/PLATELET - Abnormal; Notable for the following:    RBC 3.60 (*)    Hemoglobin 10.4 (*)    HCT 32.7 (*)    RDW 17.9 (*)    All other components within normal limits  POTASSIUM - Abnormal; Notable for the following:    Potassium 5.9 (*)    All other components within normal limits  LIPASE, BLOOD  ETHANOL  POC OCCULT BLOOD, ED   HEMOGLOBIN  Date Value Ref Range Status  11/16/2014 10.4* 13.0 - 17.0 g/dL Final  10/11/2014 9.0* 13.0 - 17.0 g/dL Final  10/10/2014 8.6* 13.0 - 17.0 g/dL Final  10/09/2014 9.7* 13.0 - 17.0 g/dL Final     Component     Latest Ref Rng 10/08/2014 10/08/2014 10/08/2014 10/08/2014 10/09/2014         8:43 AM  8:58 AM  4:20 PM  9:40 PM   Sodium     135 - 145 mmol/L 138 136 142 141 141  Potassium     3.5 - 5.1 mmol/L 4.5 4.5 4.6 4.1 3.8  Chloride     96 - 112 mmol/L 91 (L) 93 (L) 96 99 99  CO2     19 - 32 mmol/L 22  25 28  33 (H)  Glucose     70 - 99 mg/dL 422 (H) 435 (H) 274 (H) 113 (H) 168 (H)  BUN     6 - 23 mg/dL 25 (H) 27 (H) 30 (H) 35 (H) 36 (H)  Creatinine     0.50 - 1.35 mg/dL 1.39 (H) 1.50 (H) 1.32 1.26 1.29  Calcium     8.4 - 10.5 mg/dL 8.7  8.5 8.4  8.4  Total Protein     6.0 - 8.3 g/dL 6.6    6.5  Albumin     3.5 - 5.2 g/dL 2.9 (L)    3.1 (L)  AST     0 - 37 U/L 183 (H)    110 (H)  ALT     0 - 53 U/L 96 (H)    82 (H)  Alkaline Phosphatase     39 - 117 U/L 154 (H)    128 (H)  Total Bilirubin     0.3 - 1.2 mg/dL 1.1    0.7  GFR calc non Af Amer     >90 mL/min 58 (L)  61 (L) 65 (L) 63 (L)  GFR calc Af Amer     >90 mL/min 67 (L)  71 (L) 75 (L) 73 (L)  Anion gap     5 - 15 25 (H)  21 (H) 14 9   Component     Latest Ref Rng 11/16/2014 11/16/2014         7:15 PM  9:37 PM  Sodium     135 - 145 mmol/L 137   Potassium     3.5 - 5.1 mmol/L 5.4 (H) 5.9 (H)  Chloride     96 - 112 mmol/L 97   CO2     19 - 32 mmol/L 35 (H)   Glucose     70 - 99 mg/dL 129 (H)   BUN     6 - 23 mg/dL 13   Creatinine     0.50 - 1.35 mg/dL 1.03   Calcium     8.4 - 10.5 mg/dL 7.9 (L)   Total Protein     6.0 -  8.3 g/dL 6.7   Albumin     3.5 - 5.2 g/dL 2.8 (L)   AST     0 - 37 U/L 152 (H)   ALT     0 - 53 U/L 77 (H)   Alkaline Phosphatase     39 - 117 U/L 141 (H)   Total Bilirubin     0.3 - 1.2 mg/dL 1.2   GFR calc non Af Amer     >90 mL/min 83 (L)   GFR calc Af Amer     >90 mL/min >90   Anion gap     5 - 15 5    CRITICAL CARE Performed by: Daleen Bo L Total critical care time: 45 minutes Critical care time was exclusive of separately billable procedures and treating other patients. Critical care was necessary to treat or prevent imminent or life-threatening deterioration. Critical care was time spent personally by me on the following activities: development of treatment plan with patient and/or surrogate as well as nursing, discussions with consultants, evaluation of patient's response to treatment, examination of patient, obtaining history from patient or surrogate, ordering and performing treatments and interventions, ordering and review of laboratory studies, ordering and review of radiographic studies, pulse oximetry and  re-evaluation of patient's condition.     Imaging Review No results found.   EKG Interpretation   Date/Time:  Wednesday November 16 2014 23:10:12 EST Ventricular Rate:  151 PR Interval:  118 QRS Duration: 90 QT Interval:  344 QTC Calculation: 545 R Axis:   107 Text Interpretation:  Sinus tachycardia Right axis deviation Abnormal  R-wave progression, late transition Borderline ST depression, diffuse  leads Prolonged QT interval Baseline wander in lead(s) I II aVR aVF Since  last tracing rate faster and  QT has lengthened Confirmed by Eulis Foster  MD,  Vira Agar [id number]) on 11/16/2014 11:14:08 PM      MDM   Final diagnoses:  Hematemesis with nausea  Tachycardia  Hyperkalemia  Alcohol abuse    Abdominal pain with reported hematemesis, and dark stool.  Stool was brown, and guaiac positive on testing.   Nursing Notes Reviewed/ Care Coordinated, and agree without changes. Applicable Imaging Reviewed.  Interpretation of Laboratory Data incorporated into ED treatment  Plan: Admit  Richarda Blade, MD 11/17/14 [phone]

## 2014-11-17 DIAGNOSIS — G4733 Obstructive sleep apnea (adult) (pediatric): Secondary | ICD-10-CM

## 2014-11-17 DIAGNOSIS — R109 Unspecified abdominal pain: Secondary | ICD-10-CM | POA: Diagnosis present

## 2014-11-17 DIAGNOSIS — E875 Hyperkalemia: Secondary | ICD-10-CM | POA: Insufficient documentation

## 2014-11-17 DIAGNOSIS — K920 Hematemesis: Secondary | ICD-10-CM | POA: Insufficient documentation

## 2014-11-17 DIAGNOSIS — E119 Type 2 diabetes mellitus without complications: Secondary | ICD-10-CM

## 2014-11-17 DIAGNOSIS — R000 Tachycardia, unspecified: Secondary | ICD-10-CM | POA: Insufficient documentation

## 2014-11-17 DIAGNOSIS — F1010 Alcohol abuse, uncomplicated: Secondary | ICD-10-CM | POA: Insufficient documentation

## 2014-11-17 DIAGNOSIS — E860 Dehydration: Secondary | ICD-10-CM

## 2014-11-17 DIAGNOSIS — D62 Acute posthemorrhagic anemia: Secondary | ICD-10-CM | POA: Diagnosis present

## 2014-11-17 DIAGNOSIS — R1032 Left lower quadrant pain: Secondary | ICD-10-CM

## 2014-11-17 LAB — GLUCOSE, CAPILLARY
GLUCOSE-CAPILLARY: 241 mg/dL — AB (ref 70–99)
GLUCOSE-CAPILLARY: 53 mg/dL — AB (ref 70–99)
GLUCOSE-CAPILLARY: 163 mg/dL — AB (ref 70–99)
Glucose-Capillary: 81 mg/dL (ref 70–99)
Glucose-Capillary: 63 mg/dL — ABNORMAL LOW (ref 70–99)

## 2014-11-17 LAB — CBC
HCT: 28.2 % — ABNORMAL LOW (ref 39.0–52.0)
HEMATOCRIT: 30.1 % — AB (ref 39.0–52.0)
HEMOGLOBIN: 9.3 g/dL — AB (ref 13.0–17.0)
HEMOGLOBIN: 8.9 g/dL — AB (ref 13.0–17.0)
MCH: 29.4 pg (ref 26.0–34.0)
MCH: 29.4 pg (ref 26.0–34.0)
MCHC: 30.9 g/dL (ref 30.0–36.0)
MCHC: 31.6 g/dL (ref 30.0–36.0)
MCV: 95.3 fL (ref 78.0–100.0)
MCV: 93.1 fL (ref 78.0–100.0)
PLATELETS: 183 10*3/uL (ref 150–400)
Platelets: 189 10*3/uL (ref 150–400)
RBC: 3.16 MIL/uL — AB (ref 4.22–5.81)
RBC: 3.03 MIL/uL — ABNORMAL LOW (ref 4.22–5.81)
RDW: 17.9 % — AB (ref 11.5–15.5)
RDW: 18.3 % — AB (ref 11.5–15.5)
WBC: 9.2 10*3/uL (ref 4.0–10.5)
WBC: 9.2 10*3/uL (ref 4.0–10.5)

## 2014-11-17 LAB — OCCULT BLOOD, POC DEVICE: FECAL OCCULT BLD: POSITIVE — AB

## 2014-11-17 LAB — RAPID URINE DRUG SCREEN, HOSP PERFORMED
AMPHETAMINES: NOT DETECTED
Barbiturates: NOT DETECTED
Benzodiazepines: NOT DETECTED
Cocaine: NOT DETECTED
Opiates: NOT DETECTED
Tetrahydrocannabinol: NOT DETECTED

## 2014-11-17 LAB — BASIC METABOLIC PANEL
ANION GAP: 9 (ref 5–15)
Anion gap: 9 (ref 5–15)
BUN: 32 mg/dL — ABNORMAL HIGH (ref 6–23)
BUN: 38 mg/dL — ABNORMAL HIGH (ref 6–23)
CALCIUM: 7.6 mg/dL — AB (ref 8.4–10.5)
CO2: 28 mmol/L (ref 19–32)
CO2: 27 mmol/L (ref 19–32)
CREATININE: 1.38 mg/dL — AB (ref 0.50–1.35)
Calcium: 7.8 mg/dL — ABNORMAL LOW (ref 8.4–10.5)
Chloride: 105 mmol/L (ref 96–112)
Chloride: 106 mmol/L (ref 96–112)
Creatinine, Ser: 1.33 mg/dL (ref 0.50–1.35)
GFR calc non Af Amer: 58 mL/min — ABNORMAL LOW (ref >90)
GFR, EST AFRICAN AMERICAN: 71 mL/min — AB (ref >90)
GFR, EST AFRICAN AMERICAN: 67 mL/min — AB (ref >90)
GFR, EST NON AFRICAN AMERICAN: 61 mL/min — AB (ref >90)
GLUCOSE: 125 mg/dL — AB (ref 70–99)
Glucose, Bld: 61 mg/dL — ABNORMAL LOW (ref 70–99)
Potassium: 4.4 mmol/L (ref 3.5–5.1)
Potassium: 4.3 mmol/L (ref 3.5–5.1)
Sodium: 142 mmol/L (ref 135–145)
Sodium: 142 mmol/L (ref 135–145)

## 2014-11-17 LAB — TROPONIN I
Troponin I: 0.04 ng/mL — ABNORMAL HIGH (ref <0.031)
Troponin I: 0.03 ng/mL (ref <0.031)

## 2014-11-17 LAB — TYPE AND SCREEN
ABO/RH(D): O POS
ANTIBODY SCREEN: NEGATIVE

## 2014-11-17 LAB — MRSA PCR SCREENING: MRSA by PCR: POSITIVE — AB

## 2014-11-17 LAB — PROTIME-INR
INR: 1.33 (ref 0.00–1.49)
Prothrombin Time: 16.6 seconds — ABNORMAL HIGH (ref 11.6–15.2)

## 2014-11-17 LAB — MAGNESIUM: MAGNESIUM: 1.3 mg/dL — AB (ref 1.5–2.5)

## 2014-11-17 MED ORDER — ACETAMINOPHEN 325 MG PO TABS: 650.0000 mg | ORAL_TABLET | Freq: Four times a day (QID) | ORAL | Status: DC | PRN

## 2014-11-17 MED ORDER — VITAMIN B-1 100 MG PO TABS
100.0000 mg | ORAL_TABLET | Freq: Every day | ORAL | Status: DC
  Filled 2014-11-17: qty 1

## 2014-11-17 MED ORDER — SODIUM CHLORIDE 0.9 % IV SOLN
INTRAVENOUS | Status: DC
  Administered 2014-11-17 – 2014-11-18 (×3): via INTRAVENOUS
  Filled 2014-11-17: qty 1000

## 2014-11-17 MED ORDER — SODIUM CHLORIDE 0.9 % IV BOLUS (SEPSIS)
1000.0000 mL | Freq: Once | INTRAVENOUS | Status: AC
  Administered 2014-11-17: 1000 mL via INTRAVENOUS

## 2014-11-17 MED ORDER — LORAZEPAM 1 MG PO TABS: 1.0000 mg | ORAL_TABLET | Freq: Four times a day (QID) | ORAL | Status: DC | PRN

## 2014-11-17 MED ORDER — FOLIC ACID 1 MG PO TABS
1.0000 mg | ORAL_TABLET | Freq: Every day | ORAL | Status: DC
  Administered 2014-11-17 – 2014-11-18 (×2): 1 mg via ORAL
  Filled 2014-11-17 (×2): qty 1

## 2014-11-17 MED ORDER — SODIUM CHLORIDE 0.9 % IJ SOLN
3.0000 mL | Freq: Two times a day (BID) | INTRAMUSCULAR | Status: DC
  Administered 2014-11-17 – 2014-11-18 (×2): 3 mL via INTRAVENOUS

## 2014-11-17 MED ORDER — PROMETHAZINE HCL 25 MG PO TABS
12.5000 mg | ORAL_TABLET | Freq: Four times a day (QID) | ORAL | Status: DC | PRN
  Administered 2014-11-17: 12.5 mg via ORAL
  Filled 2014-11-17: qty 1

## 2014-11-17 MED ORDER — PRAVASTATIN SODIUM 80 MG PO TABS
80.0000 mg | ORAL_TABLET | Freq: Every day | ORAL | Status: DC
  Administered 2014-11-17 – 2014-11-18 (×2): 80 mg via ORAL
  Filled 2014-11-17 (×2): qty 1

## 2014-11-17 MED ORDER — CHLORHEXIDINE GLUCONATE CLOTH 2 % EX PADS
6.0000 | MEDICATED_PAD | Freq: Every day | CUTANEOUS | Status: DC
  Administered 2014-11-18: 6 via TOPICAL

## 2014-11-17 MED ORDER — PANTOPRAZOLE SODIUM 40 MG PO TBEC
40.0000 mg | DELAYED_RELEASE_TABLET | Freq: Two times a day (BID) | ORAL | Status: DC
  Administered 2014-11-17 – 2014-11-18 (×4): 40 mg via ORAL
  Filled 2014-11-17 (×5): qty 1

## 2014-11-17 MED ORDER — VITAMIN B-1 100 MG PO TABS
100.0000 mg | ORAL_TABLET | Freq: Every day | ORAL | Status: DC
  Administered 2014-11-17 – 2014-11-18 (×2): 100 mg via ORAL
  Filled 2014-11-17 (×2): qty 1

## 2014-11-17 MED ORDER — LORAZEPAM 2 MG/ML IJ SOLN
1.0000 mg | Freq: Four times a day (QID) | INTRAMUSCULAR | Status: DC | PRN
  Administered 2014-11-17 (×2): 1 mg via INTRAVENOUS
  Filled 2014-11-17 (×2): qty 1

## 2014-11-17 MED ORDER — ACETAMINOPHEN 650 MG RE SUPP: 650.0000 mg | Freq: Four times a day (QID) | RECTAL | Status: DC | PRN

## 2014-11-17 MED ORDER — LIVING WELL WITH DIABETES BOOK
Freq: Once | Status: AC
  Administered 2014-11-18: 1
  Filled 2014-11-17: qty 1

## 2014-11-17 MED ORDER — DEXTROSE 50 % IV SOLN
INTRAVENOUS | Status: AC
  Administered 2014-11-17: 25 mL
  Filled 2014-11-17: qty 50

## 2014-11-17 MED ORDER — THIAMINE HCL 100 MG/ML IJ SOLN
100.0000 mg | Freq: Every day | INTRAMUSCULAR | Status: DC
  Filled 2014-11-17 (×2): qty 1

## 2014-11-17 MED ORDER — MUPIROCIN 2 % EX OINT
1.0000 "application " | TOPICAL_OINTMENT | Freq: Two times a day (BID) | CUTANEOUS | Status: DC
  Administered 2014-11-18 (×2): 1 via NASAL
  Filled 2014-11-17: qty 22

## 2014-11-17 MED ORDER — THIAMINE HCL 100 MG/ML IJ SOLN: 100.0000 mg | Freq: Every day | INTRAMUSCULAR | Status: DC

## 2014-11-17 MED ORDER — METOPROLOL SUCCINATE ER 100 MG PO TB24
100.0000 mg | ORAL_TABLET | Freq: Every day | ORAL | Status: DC
  Administered 2014-11-17 – 2014-11-18 (×2): 100 mg via ORAL
  Filled 2014-11-17 (×2): qty 1

## 2014-11-17 MED ORDER — METOPROLOL SUCCINATE ER 100 MG PO TB24: 100.0000 mg | ORAL_TABLET | Freq: Every day | ORAL | Status: DC

## 2014-11-17 MED ORDER — SODIUM CHLORIDE 0.9 % IV SOLN: INTRAVENOUS | Status: DC

## 2014-11-17 MED ORDER — INSULIN GLARGINE 100 UNIT/ML ~~LOC~~ SOLN
10.0000 [IU] | Freq: Every day | SUBCUTANEOUS | Status: DC
  Administered 2014-11-17 (×2): 10 [IU] via SUBCUTANEOUS
  Filled 2014-11-17 (×3): qty 0.1

## 2014-11-17 MED ORDER — INSULIN ASPART 100 UNIT/ML ~~LOC~~ SOLN
0.0000 [IU] | Freq: Three times a day (TID) | SUBCUTANEOUS | Status: DC
  Administered 2014-11-17: 5 [IU] via SUBCUTANEOUS
  Administered 2014-11-18: 3 [IU] via SUBCUTANEOUS

## 2014-11-17 MED ORDER — ADULT MULTIVITAMIN W/MINERALS CH
1.0000 | ORAL_TABLET | Freq: Every day | ORAL | Status: DC
  Administered 2014-11-17 – 2014-11-18 (×2): 1 via ORAL
  Filled 2014-11-17 (×2): qty 1

## 2014-11-17 MED ORDER — LORAZEPAM 1 MG PO TABS
1.0000 mg | ORAL_TABLET | Freq: Four times a day (QID) | ORAL | Status: DC | PRN
  Administered 2014-11-17 – 2014-11-18 (×2): 1 mg via ORAL
  Filled 2014-11-17 (×2): qty 1

## 2014-11-17 NOTE — Evaluation (Signed)
Occupational Therapy Evaluation Patient Details Name: Jeffrey [name] MRN: [id number] DOB: 12-08-1965 Today's Date: 11/17/2014    History of Present Illness 49 y.o. male presenting with abdominal pain and GI bleed, noting blood in both stool and vomitus. PMH is significant for HTN, alcohol abuse, DM type 2, bleeding ulcers, and medication noncompliance. History of PEA arrest in March 2015 secondary to OSA and alcohol abuse.   Clinical Impression   Pt was independent at baseline and is currently independent to modified independent in ADL and ADL transfers.  Pt voicing many concerns about being able to afford medications.  Would prefer meds on $4 list at [facility] if possible. Pt also with high anxiety.  RN made aware. No OT needs.    Follow Up Recommendations  No OT follow up    Equipment Recommendations       Recommendations for Other Services       Precautions / Restrictions        Mobility Bed Mobility Overal bed mobility: Independent             General bed mobility comments: able to perform without difficulty  Transfers Overall transfer level: Independent Equipment used: None                  Balance Overall balance assessment: No apparent balance deficits (not formally assessed)                                          ADL Overall ADL's : Modified independent                                             Vision     Perception     Praxis      Pertinent Vitals/Pain Pain Assessment: No/denies pain     Hand Dominance Right   Extremity/Trunk Assessment Upper Extremity Assessment Upper Extremity Assessment: Overall WFL for tasks assessed   Lower Extremity Assessment Lower Extremity Assessment: Defer to PT evaluation   Cervical / Trunk Assessment Cervical / Trunk Assessment: Normal   Communication Communication Communication: No difficulties   Cognition Arousal/Alertness: Awake/alert Behavior During  Therapy: Anxious Overall Cognitive Status: Within Functional Limits for tasks assessed                     General Comments       Exercises       Shoulder Instructions      Home Living Family/patient expects to be discharged to:: Private residence Living Arrangements: Spouse/significant other Available Help at Discharge: Family;Available 24 hours/day Type of Home: House Home Access: Ramped entrance     Home Layout: One level     Bathroom Shower/Tub: Teacher, early years/pre: Standard     Home Equipment: None          Prior Functioning/Environment Level of Independence: Independent        Comments: denies working at this time ; independent with ADLs    OT Diagnosis:     OT Problem List:     OT Treatment/Interventions:      OT Goals(Current goals can be found in the care plan section) Acute Rehab OT Goals Patient Stated Goal: to get back home  OT Frequency:     Barriers  to D/C:            Co-evaluation              End of Session Nurse Communication:  (pt with anxiety at baseline and currently, concerns about me)  Activity Tolerance: Patient limited by fatigue Patient left: in bed   Time: 1510-1526 OT Time Calculation (min): 16 min Charges:  OT General Charges $OT Visit: 1 Procedure OT Evaluation $Initial OT Evaluation Tier I: 1 Procedure G-Codes:    Jeffrey [name] 11/17/2014, 3:39 PM [phone]

## 2014-11-17 NOTE — Progress Notes (Signed)
Consult Note: Patient is very familiar to our team. Patient has several admissions with hyperglycemia/DKA. Patient has also been seen several times by the Dietitians. Patient has been educated on several times. Living Well with Diabetes book ordered again. Patient's A1c is 8.5% which is consistent with his A1c 1 year ago this time. Patient was placed on Novolin 70/30 insulin due to lack of insurance some time ago. Will follow as an inpatient.  Thanks,  Tama Headings RN, MSN, [facility] Inpatient Diabetes Coordinator Team Pager [phone]

## 2014-11-17 NOTE — Progress Notes (Signed)
UR completed 

## 2014-11-17 NOTE — Progress Notes (Signed)
Pt stated he has not worn cpap in years and does not want to wear here, Rt informed pt if he changed his mind to call RT

## 2014-11-17 NOTE — Progress Notes (Signed)
Family Medicine Teaching Service Daily Progress Note Intern Pager: [phone]  Patient name: Jeffrey [name] Medical record number: [id number] Date of birth: 08-Dec-1965 Age: 49 y.o. Gender: male  Primary Care Provider: Howard Pouch, DO Consultants: Gastroenterology Code Status: Full  Assessment and Plan: 49 y.o. male presenting with abdominal pain and GI bleed, noting blood in both stool and vomitus. PMH is significant for HTN, alcohol abuse, DM type 2, bleeding ulcers, and medication noncompliance. History of PEA arrest in March 2015 secondary to OSA and alcohol abuse.  # GI Bleed: Endoscopy in January 2016 showed class C esophagitis (mucosal breaks that are continuous between the tops of two or more mucosal folds, but which involve less that 75% of the esophageal circumference), 5cm hiatal hernia, and mild duodenitis. FOBT positive. Home medication Protonix 40mg , which Jeffrey [name] reports not taking due to cost. - Hemoglobin 10.4>>9.3 s/p 2.5L bolus NS - PT 16.6, INR 1.33 - GI Consulted. Appreciate recommendations - NPO except for sips with medications - Type & screen, 2 large-bore IVs.  - Protonix 40mg . Cannot afford at discharge. Give coupon prior to discharge.  - Acetaminophen PRN pain (< 3 g per day), NSAIDs contraindicated - Phenergan PRN nausea (QT prolongation) - No pharmacologic VTE prophylaxis. SCDs. - Care Management consulted to aid with affording medications - PT/OT  # Sinus tachycardia: Likely due to dehydration/anemia. HR 90-150 in ED, with higher heart rates with ambulation or changes in position. O2 sat 96-100%. EKG sinus tachycardia. Decreased PO intake recently and appears hypovolemic on exam. Denies chest pain or shortness of breath. Well's Score 1.5 (tachycardia) so low risk for PE. Received 1.5L bolus of NS total in ED. - Orthostatic Vital Signs:  - Lying: 127/80, 110  - Sitting: 140/105, 130  - Standing: Not Done Secondary to Dizziness - Consider PE in  differential, however low likelihood based on Wells Score. Will obtain D dimer if shortness of breath or hypoxia develops.  - Given 2.5L NS Boluses - Cycle cardiac enzymes to rule out NSTEMI/demand ischemia. - Continue to monitor  # Acute blood loss anemia: Baseline hemoglobin 13-14. EGD in February 2014 showed 1cm linear ulceration in distal esophagus, no gastric ulcers. EGD in January 2016 showed class C esophagitis, 5cm hiatal hernia, and mild duodenitis. GI was consulted during this hospitalization and recommended long term PPI. Hemoglobin 10.4 in ED, up from 9 at previous hospitalization. Suspect may be hemoconcentrated due to dehydration. FOBT positive. - Hemoglobin 10.4>>9.3 s/p 2.5L bolus NS - Continue to monitor - Transfuse if less than 7  # Hyperkalemia- Potassium 5.4>5.9 in ED. Kayexalate 30g given in ED. EKG with sinus tachycardia. Not entirely certain of etiology: Possibly hemolyzed samples or medication-related (?metoprolol), denies taking cozaar. No acidemia (in fact, bicarbonate is elevated likely related to emesis). No significant ECG findings indicating use of calcium gluconate, insulin/dextrose. Suspect resolution with rehydration given preserved renal function.  - Troponin 0.04 - Follow up am CMP, Magnesium - Follow up am EKG - Replete as indicated in am  # Type 2 Diabetes- CBG 129 in ED. Home medication Lantus 20 Units, however admits to giving 10-30units depending on what blood sugars are. Hypoglycemic on 2/10 with sugars in 30s at home. Does not eat regular meals at home. - Lantus 10U, Moderate Insulin Sliding Scale - Will monitor amount of Novolog needed and adjust Lantus dose as indicated. - Diabetes coordinator - Continue to counsel on importance of regular eating habits and taking medication as prescribed.  # Transaminitis- Alcohol-related pattern:  AST 152, ALT 77, Alkaline Phosphatase 141. No other offending agents identified. Synthetic function remains intact. MELD  only 9 (based on INR from 10/09/14).  - Follow up CMP in am - Continue to monitor  # History of alcohol abuse: Denies excessive EtOH for the past several months and EtOH negative in ED, but had EtOH of 210 at previous admission. No signs of acute intoxication nor withdrawal at this admission.  - CIWA protocol--0 - Thiamine, folate - UDS- negative  # Anxiety- Home Medication Ativan 1mg  q6hr PRN - Continue home medications  # Hypertension- Home medication Toprol 100mg  (Not taking Losartan 100mg  as listed on medication list) - BP 130/79 - Continue home medications  # Obstructive Sleep Apnea- History of refusing CPAP in past - Will order CPAP nightly.   FEN/GI: NPO except for Ice Chips, NS @150  Prophylaxis: SCDs (Pharmacologic Prophylaxis contraindicated due to bleed)  Disposition: Placed in Observation  Subjective:  No acute complaints overnight. Denies any further episodes of vomiting or blood stools. States no bowel movements since ED. Feels better in seated position, stating when he lies down he feels his stomach "boiling" and nauseous. States dizziness has resolved and he no longer feels dizzy with ambulation. No further concerns today.  Objective: Temp:  [98 F (36.7 C)-99.2 F (37.3 C)] 98 F (36.7 C) (02/11 0300) Pulse Rate:  [86-133] 133 (02/11 0300) Resp:  [12-35] 35 (02/11 0300) BP: (105-155)/(57-105) 106/67 mmHg (02/11 0300) SpO2:  [96 %-100 %] 98 % (02/11 0300) Weight:  [260 lb (117.935 kg)] 260 lb (117.935 kg) (02/10 1856) Physical Exam: General: 49yo male resting comfortably in no apparent distress Cardiovascular: S1 and S2 noted. No murmurs noted. Tachycardic, however improved since yesterday. Regular rhythm. Respiratory: Clear to auscultation bilaterally. No wheezes noted. No increased work of breathing. Abdomen: Bowel sounds noted. Soft and nondistended. Tenderness over LLQ.  Extremities: No edema noted.   Laboratory:  Recent Labs Lab 11/16/14 1915  11/17/14 0400  WBC 7.5 9.2  HGB 10.4* 9.3*  HCT 32.7* 30.1*  PLT 199 183    Recent Labs Lab 11/16/14 1915 11/16/14 2137  NA 137  --   K 5.4* 5.9*  CL 97  --   CO2 35*  --   BUN 13  --   CREATININE 1.03  --   CALCIUM 7.9*  --   PROT 6.7  --   BILITOT 1.2  --   ALKPHOS 141*  --   ALT 77*  --   AST 152*  --   GLUCOSE 129*  --   - PT 16.6 - INR 1.33 - Ethyl Alcohol 129 - UDS negative  Imaging/Diagnostic Tests: No results found.  [place], [place] 11/17/2014, 5:26 AM PGY-1, [place] Intern pager: [phone], text pages welcome

## 2014-11-17 NOTE — ED Notes (Signed)
Pt vomited sm amount of coffee ground emesis.

## 2014-11-17 NOTE — Progress Notes (Signed)
INITIAL NUTRITION ASSESSMENT  DOCUMENTATION CODES Per approved criteria  -Obesity Unspecified   INTERVENTION: -Recommend Boost BID once diet is advanced  NUTRITION DIAGNOSIS: Inadequate oral intake related to decreased appetite as evidenced by pt report.   Goal: Pt to meet >/= 90% of estimated needs  Monitor:  Pt PO intake once diet advanced, weight trends, labs  Reason for Assessment: MST = 3  49 y.o. male  Admitting Dx: GI bleed  ASSESSMENT: Pt with GI bleed complaining of blood in stool and emesis. Hx of HTN, DM, alcohol abuse, bleeding ulcers and medication non-compliance.  Currently on CIWA protocol.  Pt explains for the past three years he just doesn't find food appetizing.  He doesn't eat on a normal schedule, just when ever he gets to it.  Reports frequently drinking Boost at home since he knows he is not eating regularly. Pt requested to have Boost once diet advances.  Pt reports usual body weight of 260 lbs.    Nutrition Focused Physical Exam: No signs of fat or muscle wasting    Height: Ht Readings from Last 1 Encounters:  11/16/14 5\' 10"  (1.778 m)    Weight: Wt Readings from Last 1 Encounters:  11/16/14 260 lb (117.935 kg)    Ideal Body Weight: 166 lbs  % Ideal Body Weight: 156%  Wt Readings from Last 10 Encounters:  11/16/14 260 lb (117.935 kg)  10/11/14 248 lb 14.4 oz (112.9 kg)  08/30/14 258 lb (117.028 kg)  08/21/14 237 lb 14 oz (107.9 kg)  05/19/14 264 lb 15.9 oz (120.2 kg)  01/14/14 264 lb 11.2 oz (120.067 kg)  12/29/13 268 lb 1.6 oz (121.609 kg)  08/06/13 280 lb 6.4 oz (127.189 kg)  06/04/13 207 lb 4.8 oz (94.031 kg)  05/31/13 268 lb 15.4 oz (122 kg)    Usual Body Weight: 260 lbs  % Usual Body Weight: 100%  BMI:  Body mass index is 37.31 kg/(m^2).  Estimated Nutritional Needs: Kcal: 2000-2200 kcal Protein: 90-105 g protein Fluid: >/= 2 L/day  Skin: WDL  Diet Order: Diet NPO time specified Except for: Sips with  Meds  EDUCATION NEEDS: -No education needs identified at this time   Intake/Output Summary (Last 24 hours) at 11/17/14 0914 Last data filed at 11/17/14 0900  Gross per 24 hour  Intake      0 ml  Output     50 ml  Net    -50 ml    Last BM: PTA   Labs:   Recent Labs Lab 11/16/14 1915 11/16/14 2137  NA 137  --   K 5.4* 5.9*  CL 97  --   CO2 35*  --   BUN 13  --   CREATININE 1.03  --   CALCIUM 7.9*  --   GLUCOSE 129*  --     CBG (last 3)   Recent Labs  11/17/14 0239  GLUCAP 241*    Scheduled Meds: . folic acid  1 mg Oral Daily  . insulin aspart  0-15 Units Subcutaneous TID WC  . insulin glargine  10 Units Subcutaneous QHS  . living well with diabetes book   Does not apply Once  . metoprolol succinate  100 mg Oral Daily  . multivitamin with minerals  1 tablet Oral Daily  . pantoprazole  40 mg Oral BID  . pravastatin  80 mg Oral Daily  . sodium chloride  3 mL Intravenous Q12H  . thiamine  100 mg Oral Daily   Or  .  thiamine  100 mg Intravenous Daily    Continuous Infusions:   Past Medical History  Diagnosis Date  . Hypertension   . Blood transfusion 2014    "related to bleeding ulcer"  . Venous insufficiency 12/13/2011  . Cardiac arrest 12/07/2012  . Anxiety   . Acute renal insufficiency 12/08/2012  . High cholesterol   . OSA on CPAP 12/07/2012  . Type II diabetes mellitus   . Bleeding ulcer 2014  . GERD (gastroesophageal reflux disease)     Past Surgical History  Procedure Laterality Date  . Knee arthroscopy Left ~ 1999  . Esophagogastroduodenoscopy Left 11/26/2012    Procedure: ESOPHAGOGASTRODUODENOSCOPY (EGD);  Surgeon: Wonda Horner, MD;  Location: [facility] ENDOSCOPY;  Service: Endoscopy;  Laterality: Left;  . Cardiovascular stress test  03/16/13    Very Poor Exercise Tolerance; NON DIAGNOSTIC TEST  . Cataract extraction w/ intraocular lens implant Left 03/07/2014    [facility] @ [facility]  . Esophagogastroduodenoscopy N/A 10/10/2014    Procedure:  ESOPHAGOGASTRODUODENOSCOPY (EGD);  Surgeon: Ladene Artist, MD;  Location: [facility] ENDOSCOPY;  Service: Endoscopy;  Laterality: N/A;    Elmer Picker MS Dietetic Intern Pager Number [phone]

## 2014-11-17 NOTE — ED Notes (Signed)
After returning from bathroom pt c/o feeling dizzy, pt very diaphoretic.  After getting back on stretcher, pt st's starting to feel better.  Admitting MD at bedsidel

## 2014-11-17 NOTE — Progress Notes (Signed)
Pt unable to complete orthostatic vital signs due to dizziness upon standing. Pt became very dizzy with standing and had to sit back on side of the bed before b/p could finish counting down.  Will continue to monitor pt.

## 2014-11-17 NOTE — Evaluation (Addendum)
Physical Therapy Evaluation Patient Details Name: Jeffrey [name] MRN: [id number] DOB: 1966/08/01 Today's Date: 11/17/2014   History of Present Illness  49 y.o. male presenting with abdominal pain and GI bleed, noting blood in both stool and vomitus. PMH is significant for HTN, alcohol abuse, DM type 2, bleeding ulcers, and medication noncompliance. History of PEA arrest in March 2015 secondary to OSA and alcohol abuse.  Clinical Impression  Patient demonstrates deficits in functional mobility as indicated below. Will need continued skilled PT to address deficits and maximize function. Will see as indicated and progress as tolerated.  Will perform 1-2 more sessions to ensure patient safe with ambulation and activity tolerance.  OF NOTE: Session limited secondary to patient with elevated HR upon standing sustained upper 140s 150s. Held ambulation.    Follow Up Recommendations No PT follow up    Equipment Recommendations  None recommended by PT    Recommendations for Other Services       Precautions / Restrictions        Mobility  Bed Mobility Overal bed mobility: Independent             General bed mobility comments: able to perform without difficulty  Transfers Overall transfer level: Independent Equipment used: None                Ambulation/Gait             General Gait Details: Deferred dur to elevated HR upon standing,   Stairs            Wheelchair Mobility    Modified Rankin (Stroke Patients Only)       Balance Overall balance assessment: No apparent balance deficits (not formally assessed)                                           Pertinent Vitals/Pain Pain Assessment: No/denies pain    Home Living Family/patient expects to be discharged to:: Private residence Living Arrangements: Spouse/significant other Available Help at Discharge: Family;Available 24 hours/day Type of Home: House Home Access: Ramped  entrance     Home Layout: One level Home Equipment: None      Prior Function Level of Independence: Independent               Hand Dominance        Extremity/Trunk Assessment   Upper Extremity Assessment: Overall WFL for tasks assessed           Lower Extremity Assessment:  (history of diabetic neuropathy BLEs)         Communication   Communication: No difficulties  Cognition Arousal/Alertness: Awake/alert Behavior During Therapy: WFL for tasks assessed/performed Overall Cognitive Status: Within Functional Limits for tasks assessed                      General Comments      Exercises        Assessment/Plan    PT Assessment Patient needs continued PT services  PT Diagnosis Difficulty walking   PT Problem List Decreased activity tolerance;Decreased mobility;Cardiopulmonary status limiting activity  PT Treatment Interventions Gait training;Therapeutic activities   PT Goals (Current goals can be found in the Care Plan section) Acute Rehab PT Goals Patient Stated Goal: to get back home PT Goal Formulation: With patient Time For Goal Achievement: 11/24/14 Potential to Achieve Goals: Good    Frequency  Min 2X/week   Barriers to discharge        Co-evaluation               End of Session   Activity Tolerance: Treatment limited secondary to medical complications (Comment) Patient left: in chair;with call bell/phone within reach Nurse Communication: Mobility status         Time: [id number] PT Time Calculation (min) (ACUTE ONLY): 19 min   Charges:   PT Evaluation $Initial PT Evaluation Tier I: 1 Procedure     PT G CodesDuncan Dull 12-10-14, 12:27 PM Alben Deeds, [place] DPT  [phone]

## 2014-11-17 NOTE — Consult Note (Signed)
Subjective:   HPI  The patient is a 49 year old male who was admitted to the hospital with complaints of gastrointestinal bleeding characterized by coffee-ground emesis and melena. He was recently in the hospital last month with the same problem and had an EGD which showed ulcerative distal esophagitis. He was sent home on a prescription for a PPI, but states he could not afford the medicine and therefore did not take it. A couple of years ago he was in the hospital and had an EGD again showing ulcerative distal esophagitis. His hemoglobin has remained stable over the past month. He continues to drink alcohol. He does have some elevated liver enzymes felt related to alcohol.  Review of Systems No chest pain or shortness of breath  Past Medical History  Diagnosis Date  . Hypertension   . Blood transfusion 2014    "related to bleeding ulcer"  . Venous insufficiency 12/13/2011  . Cardiac arrest 12/07/2012  . Anxiety   . Acute renal insufficiency 12/08/2012  . High cholesterol   . OSA on CPAP 12/07/2012  . Type II diabetes mellitus   . Bleeding ulcer 2014  . GERD (gastroesophageal reflux disease)    Past Surgical History  Procedure Laterality Date  . Knee arthroscopy Left ~ 1999  . Esophagogastroduodenoscopy Left 11/26/2012    Procedure: ESOPHAGOGASTRODUODENOSCOPY (EGD);  Surgeon: Wonda Horner, MD;  Location: [facility] ENDOSCOPY;  Service: Endoscopy;  Laterality: Left;  . Cardiovascular stress test  03/16/13    Very Poor Exercise Tolerance; NON DIAGNOSTIC TEST  . Cataract extraction w/ intraocular lens implant Left 03/07/2014    [facility] @ [facility]  . Esophagogastroduodenoscopy N/A 10/10/2014    Procedure: ESOPHAGOGASTRODUODENOSCOPY (EGD);  Surgeon: Ladene Artist, MD;  Location: [facility] ENDOSCOPY;  Service: Endoscopy;  Laterality: N/A;   History   Social History  . Marital Status: Married    Spouse Name: N/A  . Number of Children: N/A  . Years of Education: N/A   Occupational History  .  Not on file.   Social History Main Topics  . Smoking status: Never Smoker   . Smokeless tobacco: Current User    Types: Snuff  . Alcohol Use: 3.6 oz/week    6 Cans of beer per week  . Drug Use: No  . Sexual Activity: Yes   Other Topics Concern  . Not on file   Social History Narrative   family history is not on file.  Current facility-administered medications:  .  acetaminophen (TYLENOL) tablet 650 mg, 650 mg, Oral, Q6H PRN **OR** acetaminophen (TYLENOL) suppository 650 mg, 650 mg, Rectal, Q6H PRN, [place] N Rumley, DO .  folic acid (FOLVITE) tablet 1 mg, 1 mg, Oral, Daily, [place] N Rumley, DO, 1 mg at 11/17/14 1018 .  insulin aspart (novoLOG) injection 0-15 Units, 0-15 Units, Subcutaneous, TID WC, [facility], DO, 5 Units at 11/17/14 [phone] .  insulin glargine (LANTUS) injection 10 Units, 10 Units, Subcutaneous, QHS, [place] N Rumley, DO, 10 Units at 11/17/14 0235 .  living well with diabetes book MISC, , Does not apply, Once, [place], MD .  LORazepam (ATIVAN) tablet 1 mg, 1 mg, Oral, Q6H PRN **OR** LORazepam (ATIVAN) injection 1 mg, 1 mg, Intravenous, Q6H PRN, [place] N Rumley, DO, 1 mg at 11/17/14 [id number] .  LORazepam (ATIVAN) tablet 1 mg, 1 mg, Oral, Q6H PRN, [place] N Rumley, DO .  metoprolol succinate (TOPROL-XL) 24 hr tablet 100 mg, 100 mg, Oral, Daily, Patrecia Pour, MD, 100 mg  at 11/17/14 0335 .  multivitamin with minerals tablet 1 tablet, 1 tablet, Oral, Daily, [place] N Rumley, DO, 1 tablet at 11/17/14 1018 .  pantoprazole (PROTONIX) EC tablet 40 mg, 40 mg, Oral, BID, [place] N Rumley, DO, 40 mg at 11/17/14 1017 .  pravastatin (PRAVACHOL) tablet 80 mg, 80 mg, Oral, Daily, [place] N Rumley, DO, 80 mg at 11/17/14 1018 .  promethazine (PHENERGAN) tablet 12.5 mg, 12.5 mg, Oral, Q6H PRN, [place] N Rumley, DO, 12.5 mg at 11/17/14 0226 .  sodium chloride 0.9 % 1,000 mL infusion, , Intravenous, Continuous, [place] N Rumley, DO, Last Rate: 150 mL/hr at 11/17/14 1000 .  sodium  chloride 0.9 % injection 3 mL, 3 mL, Intravenous, Q12H, [place] N Rumley, DO, 3 mL at 11/17/14 0200 .  thiamine (VITAMIN B-1) tablet 100 mg, 100 mg, Oral, Daily, 100 mg at 11/17/14 0335 **OR** thiamine (B-1) injection 100 mg, 100 mg, Intravenous, Daily, Patrecia Pour, MD Allergies  Allergen Reactions  . Lisinopril Other (See Comments)    Pt reports nose bleed.     Objective:     BP 85/39 mmHg  Pulse 98  Temp(Src) 98.7 F (37.1 C) (Oral)  Resp 19  Ht 5\' 10"  (1.778 m)  Wt 117.935 kg (260 lb)  BMI 37.31 kg/m2  SpO2 99%  He is in no distress  Nonicteric  Heart regular rhythm no murmurs  Lungs clear  Abdomen: Bowel sounds normal, soft, nontender no obvious hepatosplenomegaly  Laboratory No components found for: D1    Assessment:     49 year old male with known ulcerative distal esophagitis who presents again to the hospital with signs of coffee-ground emesis and melena. Unfortunately he did not take any medications for this after his last discharge last month. He continues to drink alcohol      Plan:     Avoidance of alcohol. Avoidance of aspirin and NSAIDs. PPI therapy. I don't know where he is going to be able to get his medications but this needs to be addressed. I see no indication for repeat endoscopy. I would advance his diet. He can go home when he is able. No further GI intervention planned at this time. We will sign off. Call us if needed. Lab Results  Component Value Date   HGB 9.3* 11/17/2014   HGB 10.4* 11/16/2014   HGB 9.0* 10/11/2014   HCT 30.1* 11/17/2014   HCT 32.7* 11/16/2014   HCT 27.4* 10/11/2014   ALKPHOS 141* 11/16/2014   ALKPHOS 128* 10/09/2014   ALKPHOS 154* 10/08/2014   AST 152* 11/16/2014   AST 110* 10/09/2014   AST 183* 10/08/2014   ALT 77* 11/16/2014   ALT 82* 10/09/2014   ALT 96* 10/08/2014   AMYLASE 70 05/19/2014   AMYLASE 42 10/13/2007   AMYLASE 254* 10/10/2007

## 2014-11-17 NOTE — Care Management Note (Signed)
    Page 1 of 1   11/17/2014     11:01:03 AM CARE MANAGEMENT NOTE 11/17/2014  Patient:  ALYX, MUSKETT   Account Number:  [id number]  Date Initiated:  11/17/2014  Documentation initiated by:  Elissa Hefty  Subjective/Objective Assessment:   adm w gi bleed     Action/Plan:   lives w wife, pcp dr Howard Pouch   Anticipated DC Date:     Anticipated DC Plan:  [place]  CM consult  [facility]      Choice offered to / List presented to:             Status of service:   Medicare Important Message given?   (If response is "NO", the following Medicare IM given date fields will be blank) Date Medicare IM given:   Medicare IM given by:   Date Additional Medicare IM given:   Additional Medicare IM given by:    Discharge Disposition:    Per UR Regulation:  Reviewed for med. necessity/level of care/duration of stay  If discussed at [place] of Stay Meetings, dates discussed:    Comments:  2/11 1059 debbie Tacari Repass rn,bsn gave pt inform on med assist program in [place] and also inform on [facility], [place] [facility].

## 2014-11-18 LAB — CBC
HEMATOCRIT: 26.2 % — AB (ref 39.0–52.0)
HEMOGLOBIN: 8.3 g/dL — AB (ref 13.0–17.0)
MCH: 28.7 pg (ref 26.0–34.0)
MCHC: 31.7 g/dL (ref 30.0–36.0)
MCV: 90.7 fL (ref 78.0–100.0)
Platelets: 197 10*3/uL (ref 150–400)
RBC: 2.89 MIL/uL — AB (ref 4.22–5.81)
RDW: 18.6 % — ABNORMAL HIGH (ref 11.5–15.5)
WBC: 7.1 10*3/uL (ref 4.0–10.5)

## 2014-11-18 LAB — BASIC METABOLIC PANEL
Anion gap: 9 (ref 5–15)
BUN: 31 mg/dL — AB (ref 6–23)
CHLORIDE: 106 mmol/L (ref 96–112)
CO2: 24 mmol/L (ref 19–32)
CREATININE: 1.15 mg/dL (ref 0.50–1.35)
Calcium: 7.9 mg/dL — ABNORMAL LOW (ref 8.4–10.5)
GFR calc non Af Amer: 73 mL/min — ABNORMAL LOW (ref >90)
GFR, EST AFRICAN AMERICAN: 84 mL/min — AB (ref >90)
Glucose, Bld: 148 mg/dL — ABNORMAL HIGH (ref 70–99)
Potassium: 3.5 mmol/L (ref 3.5–5.1)
Sodium: 139 mmol/L (ref 135–145)

## 2014-11-18 LAB — CLOSTRIDIUM DIFFICILE BY PCR: CDIFFPCR: POSITIVE — AB

## 2014-11-18 LAB — GLUCOSE, CAPILLARY
GLUCOSE-CAPILLARY: 121 mg/dL — AB (ref 70–99)
Glucose-Capillary: 172 mg/dL — ABNORMAL HIGH (ref 70–99)

## 2014-11-18 MED ORDER — METRONIDAZOLE 500 MG PO TABS: 500.0000 mg | ORAL_TABLET | Freq: Three times a day (TID) | ORAL | Status: DC

## 2014-11-18 MED ORDER — OMEPRAZOLE 20 MG PO CPDR: 20.0000 mg | DELAYED_RELEASE_CAPSULE | Freq: Every day | ORAL | Status: DC

## 2014-11-18 MED ORDER — INSULIN GLARGINE 100 UNIT/ML ~~LOC~~ SOLN: 20.0000 [IU] | Freq: Every day | SUBCUTANEOUS | Status: DC

## 2014-11-18 MED ORDER — LORAZEPAM 1 MG PO TABS: 60.0000 mg | ORAL_TABLET | Freq: Four times a day (QID) | ORAL | Status: DC | PRN

## 2014-11-18 MED ORDER — METRONIDAZOLE 500 MG PO TABS
500.0000 mg | ORAL_TABLET | Freq: Three times a day (TID) | ORAL | Status: DC
  Administered 2014-11-18 (×2): 500 mg via ORAL
  Filled 2014-11-18 (×4): qty 1

## 2014-11-18 NOTE — Progress Notes (Signed)
Family Medicine Teaching Service Daily Progress Note Intern Pager: [phone]  Patient name: Jeffrey [name] Medical record number: [id number] Date of birth: 09-18-1966 Age: 49 y.o. Gender: male  Primary Care Provider: Howard Pouch, DO Consultants: Gastroenterology Code Status: Full  Assessment and Plan: 49 y.o. male presenting with abdominal pain and GI bleed, noting blood in both stool and vomitus. PMH is significant for HTN, alcohol abuse, DM type 2, bleeding ulcers, and medication noncompliance. History of PEA arrest in March 2015 secondary to OSA and alcohol abuse.  # GI Bleed: Endoscopy in January 2016 showed class C esophagitis (mucosal breaks that are continuous between the tops of two or more mucosal folds, but which involve less that 75% of the esophageal circumference), 5cm hiatal hernia, and mild duodenitis. FOBT positive. Home medication Protonix 40mg , which Mr. Vaden reports not taking due to cost. - Hemoglobin 8.9> 8.3 - PT 16.6, INR 1.33 - GI Consulted. Appreciate recommendations  - No endoscopy at this time  - Currently on Protonix. Discharge with medication he can afford.  - Advance diet--on carb modified diet - Type & screen, 2 large-bore IVs.  - Protonix 40mg . Cannot afford at discharge. Give coupon prior to discharge.  - Acetaminophen PRN pain (< 3 g per day), NSAIDs contraindicated - Phenergan PRN nausea (QT prolongation) - No pharmacologic VTE prophylaxis. SCDs. - Care Management consulted to aid with affording medications - PT/OT  # Diarrhea - C difficile positive - Metronidazole 500mg  q8hr (2/12>>) - Counsel on dangers of mixing alcohol with metronidazole as outpatient.  # Sinus tachycardia: Likely due to dehydration/anemia. HR 90-150 in ED, with higher heart rates with ambulation or changes in position. O2 sat 96-100%. EKG sinus tachycardia. Decreased PO intake recently and appears hypovolemic on exam. Denies chest pain or shortness of breath. Well's Score  1.5 (tachycardia) so low risk for PE. Received 1.5L bolus of NS total in ED. - Improved - Orthostatic Vital Signs:  - Lying: 127/80, 110  - Sitting: 140/105, 130  - Standing: Not Done Secondary to Dizziness - Consider PE in differential, however low likelihood based on Wells Score. Will obtain D dimer if shortness of breath or hypoxia develops.  - Given 2.5L NS Boluses and on NS @150  - Continue to monitor  # Hyperkalemia- Potassium 5.4>5.9 in ED. Kayexalate 30g given in ED. EKG with sinus tachycardia. Not entirely certain of etiology: Possibly hemolyzed samples or medication-related (?metoprolol), denies taking cozaar. No acidemia (in fact, bicarbonate is elevated likely related to emesis). No significant ECG findings indicating use of calcium gluconate, insulin/dextrose. Suspect resolution with rehydration given preserved renal function.  - Potassium 3.5 today - Troponin 0.04 - Continue to monitor  # Type 2 Diabetes- CBG 129 in ED. Home medication Lantus 20 Units, however admits to giving 10-30units depending on what blood sugars are. Hypoglycemic on 2/10 with sugars in 30s at home. Does not eat regular meals at home. - Lantus 10U, Moderate Insulin Sliding Scale - Will monitor amount of Novolog needed and adjust Lantus dose as indicated. - Diabetes coordinator - Continue to counsel on importance of regular eating habits and taking medication as prescribed.  # History of alcohol abuse: Denies excessive EtOH for the past several months and EtOH negative in ED, but had EtOH of 210 at previous admission. No signs of acute intoxication nor withdrawal at this admission.  - CIWA protocol--0>2>4>0 - Thiamine, folate - UDS- negative  # Anxiety- Home Medication Ativan 1mg  q6hr PRN - Continue home medications  FEN/GI: NPO  except for Ice Chips, NS @150  Prophylaxis: SCDs (Pharmacologic Prophylaxis contraindicated due to bleed)  Disposition: Placed in Observation  Subjective:  No acute  complaints overnight. Denies vomiting or blood in stool. States he is able to lay down without having "boiling" in stomach now. Tolerating diet well. Still has some diarrhea. Would like to be discharged home if medically ready. No further complaints today.  Objective: Temp:  [98.6 F (37 C)-98.9 F (37.2 C)] 98.7 F (37.1 C) (02/12 0332) Pulse Rate:  [78-116] 116 (02/12 0332) Resp:  [17-28] 28 (02/12 0332) BP: (85-151)/(39-83) 151/81 mmHg (02/12 0332) SpO2:  [99 %-100 %] 100 % (02/12 0332) Physical Exam: General: 49yo male resting comfortably in no apparent distress Cardiovascular: S1 and S2 noted. No murmurs noted. Tachycardic, however improved since yesterday. Regular rhythm. Respiratory: Clear to auscultation bilaterally. No wheezes noted. No increased work of breathing. Abdomen: Bowel sounds noted. Soft and nondistended. Nontender.  Extremities: No edema noted.   Laboratory:  Recent Labs Lab 11/17/14 0400 11/17/14 1658 11/18/14 0322  WBC 9.2 9.2 7.1  HGB 9.3* 8.9* 8.3*  HCT 30.1* 28.2* 26.2*  PLT 183 189 197    Recent Labs Lab 11/16/14 1915  11/17/14 1025 11/17/14 1658 11/18/14 0322  NA 137  --  142 142 139  K 5.4*  < > 4.4 4.3 3.5  CL 97  --  105 106 106  CO2 35*  --  28 27 24   BUN 13  --  32* 38* 31*  CREATININE 1.03  --  1.33 1.38* 1.15  CALCIUM 7.9*  --  7.8* 7.6* 7.9*  PROT 6.7  --   --   --   --   BILITOT 1.2  --   --   --   --   ALKPHOS 141*  --   --   --   --   ALT 77*  --   --   --   --   AST 152*  --   --   --   --   GLUCOSE 129*  --  125* 61* 148*  < > = values in this interval not displayed.- PT 16.6 - INR 1.33 - Ethyl Alcohol 129 - UDS negative - C difficile positive  Imaging/Diagnostic Tests: No results found.  [place], [place] 11/18/2014, 7:06 AM PGY-1, [place] Intern pager: [phone], text pages welcome

## 2014-11-18 NOTE — Progress Notes (Signed)
Order to discharge home. Pt informed and verbalized understanding. Discharge instructions given and explained to pt, pt .verbalized understanding. Iv access discontinued, pressure held to site for 39mins and covered by 2x2 and taped times two sites. CCM notified. Belongings packed. Pt transported  via w/c with belongings and family in tow. Discharge paperwork given to pt. Will monitor

## 2014-11-18 NOTE — Discharge Instructions (Signed)
Please take your medication as prescribed. I have included some coupons for you to get discounts on some of your medications. It is very important that you take Omeprazole as an outpatient. It is also important that you stop drinking alcohol completely as that is believed to have contributed to your GI bleed. You were noted to have an infection known as C. difficile, which is causing your diarrhea. You will need to take Metronidazole three times a day for ten days to treat this. It is very important that you not consume any alcohol for up to three days after stopping this medication as it can make you very very sick. Please do not take any Aspirin or NSAIDs as this will make your GI bleed worse. Please follow up at your [facility] on 11/25/14.  Gastrointestinal Bleeding Gastrointestinal (GI) bleeding means there is bleeding somewhere along the digestive tract, between the mouth and anus. CAUSES  There are many different problems that can cause GI bleeding. Possible causes include:  Esophagitis. This is inflammation, irritation, or swelling of the esophagus.  Hemorrhoids.These are veins that are full of blood (engorged) in the rectum. They cause pain, inflammation, and may bleed.  Anal fissures.These are areas of painful tearing which may bleed. They are often caused by passing hard stool.  Diverticulosis.These are pouches that form on the colon over time, with age, and may bleed significantly.  Diverticulitis.This is inflammation in areas with diverticulosis. It can cause pain, fever, and bloody stools, although bleeding is rare.  Polyps and cancer. Colon cancer often starts out as precancerous polyps.  Gastritis and ulcers.Bleeding from the upper gastrointestinal tract (near the stomach) may travel through the intestines and produce black, sometimes tarry, often bad smelling stools. In certain cases, if the bleeding is fast enough, the stools may not be black, but red. This  condition may be life-threatening. SYMPTOMS   Vomiting bright red blood or material that looks like coffee grounds.  Bloody, black, or tarry stools. DIAGNOSIS  Your caregiver may diagnose your condition by taking your history and performing a physical exam. More tests may be needed, including:  X-rays and other imaging tests.  Esophagogastroduodenoscopy (EGD). This test uses a flexible, lighted tube to look at your esophagus, stomach, and small intestine.  Colonoscopy. This test uses a flexible, lighted tube to look at your colon. TREATMENT  Treatment depends on the cause of your bleeding.   For bleeding from the esophagus, stomach, small intestine, or colon, the caregiver doing your EGD or colonoscopy may be able to stop the bleeding as part of the procedure.  Inflammation or infection of the colon can be treated with medicines.  Many rectal problems can be treated with creams, suppositories, or warm baths.  Surgery is sometimes needed.  Blood transfusions are sometimes needed if you have lost a lot of blood. If bleeding is slow, you may be allowed to go home. If there is a lot of bleeding, you will need to stay in the hospital for observation. HOME CARE INSTRUCTIONS   Take any medicines exactly as prescribed.  Keep your stools soft by eating foods that are high in fiber. These foods include whole grains, legumes, fruits, and vegetables. Prunes (1 to 3 a day) work well for many people.  Drink enough fluids to keep your urine clear or pale yellow. SEEK IMMEDIATE MEDICAL CARE IF:   Your bleeding increases.  You feel lightheaded, weak, or you faint.  You have severe cramps in your back or abdomen.  You pass large blood clots in your stool.  Your problems are getting worse. MAKE SURE YOU:   Understand these instructions.  Will watch your condition.  Will get help right away if you are not doing well or get worse. Document Released: 09/20/2000 Document Revised:  09/09/2012 Document Reviewed: 09/02/2011 [facility] Patient Information 2015 [place], [place]. This information is not intended to replace advice given to you by your health care provider. Make sure you discuss any questions you have with your health care provider.

## 2014-11-20 NOTE — Discharge Summary (Signed)
[facility] Discharge Summary  Patient name: Jeffrey [name] Medical record number: [id number] Date of birth: 1966-04-23 Age: 49 y.o. Gender: male Date of Admission: 11/16/2014  Date of Discharge: 11/18/14 Admitting Physician: Dickie La, MD  Primary Care Provider: Howard Pouch, DO Consultants: GI  Indication for Hospitalization: GI Bleed  Discharge Diagnoses/Problem List:  GI Bleed Sinus Tachycardia Anemia Hyperkalemia C. difficile Type 2 Diabetes Transaminitis History of Alcohol Abuse Anxiety HTN OSA  Disposition: Discharge Home  Discharge Condition: Stable  Brief Hospital Course:  Presented to the Emergency Department on 11/16/14 with abdominal pain and blood in stool and vomitus. Endoscopy in January 2016 showed class C esophagitis, hiatal hernia, and mild duodenitis. Reported not taking Protonix as was recommended due to cost. NPO at admission and diet was advanced as tolerated. FOBT positive at admission. Hemoglobin 10.4 in emergency department and was stable throughout hospitalization. Gastroenterology consulted and recommended discharge with affordable PPI and counseling to discontinue alcohol use. Repeat endoscopy not recommended.   Sinus tachycardia was noted at admission, likely due to dehydration and anemia. Fluids were given throughout hospitalization with improvement. Wells Score of 1.5 indicated low risk for PE. Orthostatic vital signs negative. Troponins negative.  Noted to be hyperkalemic at admission with potassium of 5.9. Kayexalate 30g given. No changes noted on EKG. Troponins negative. Improved to 3.5 at discharge.  Diarrhea noted prior to hospitalization. C. Difficile positive. Metronidazole initated on 2/12. Counseled on consequences of combining with alcohol.  Discharged on 2/12 following resolution of vomiting and abdominal pain. Discharged with omeprazole and coupons to afford medications.   Issues for Follow Up:  1. C.  Difficile positive. Discharged with Metronidazole. Counseled on consequences of mixing with alcohol. Follow up diarrhea. 2. Difficulty affording medications. Follow up compliance. 3. Alcohol use suspected to be contributing to GI Bleed. Continue to counsel on cessation. 4. Follow up HR  Significant Procedures: None  Significant Labs and Imaging:   Recent Labs Lab 11/17/14 0400 11/17/14 1658 11/18/14 0322  WBC 9.2 9.2 7.1  HGB 9.3* 8.9* 8.3*  HCT 30.1* 28.2* 26.2*  PLT 183 189 197    Recent Labs Lab 11/16/14 1915  11/17/14 1025 11/17/14 1658 11/18/14 0322  NA 137  --  142 142 139  K 5.4*  < > 4.4 4.3 3.5  CL 97  --  105 106 106  CO2 35*  --  28 27 24   GLUCOSE 129*  --  125* 61* 148*  BUN 13  --  32* 38* 31*  CREATININE 1.03  --  1.33 1.38* 1.15  CALCIUM 7.9*  --  7.8* 7.6* 7.9*  MG  --   --  1.3*  --   --   ALKPHOS 141*  --   --   --   --   AST 152*  --   --   --   --   ALT 77*  --   --   --   --   ALBUMIN 2.8*  --   --   --   --   < > = values in this interval not displayed. - Troponin negative - PT 16.6 - INR 1.33 - C. Difficile positive - UDS negative - FOBT positive  Results/Tests Pending at Time of Discharge: None  Discharge Medications:    Medication List    STOP taking these medications        gabapentin 300 MG capsule  Commonly known as:  NEURONTIN  losartan 100 MG tablet  Commonly known as:  COZAAR     pantoprazole 40 MG tablet  Commonly known as:  PROTONIX      TAKE these medications        glucose blood test strip  Use as instructed     insulin glargine 100 UNIT/ML injection  Commonly known as:  LANTUS  Inject 0.2 mLs (20 Units total) into the skin daily.     LORazepam 1 MG tablet  Commonly known as:  ATIVAN  Take 1 tablet (1 mg total) by mouth every 6 (six) hours as needed for anxiety.     LORazepam 1 MG tablet  Commonly known as:  ATIVAN  Take 60 tablets (60 mg total) by mouth every 6 (six) hours as needed for anxiety.      metoprolol succinate 100 MG 24 hr tablet  Commonly known as:  TOPROL-XL  Take 1 tablet (100 mg total) by mouth daily. Take with or immediately following a meal.     metroNIDAZOLE 500 MG tablet  Commonly known as:  FLAGYL  Take 1 tablet (500 mg total) by mouth 3 (three) times daily. Do not drink Alcohol during treatment and up to three days after treatment is complete.     omeprazole 20 MG capsule  Commonly known as:  PRILOSEC  Take 1 capsule (20 mg total) by mouth daily.     pravastatin 80 MG tablet  Commonly known as:  PRAVACHOL  Take 1 tablet (80 mg total) by mouth daily.     simethicone 80 MG chewable tablet  Commonly known as:  MYLICON  Chew 1 tablet (80 mg total) by mouth every 6 (six) hours as needed for flatulence.        Discharge Instructions: Please refer to Patient Instructions section of EMR for full details.  Patient was counseled important signs and symptoms that should prompt return to medical care, changes in medications, dietary instructions, activity restrictions, and follow up appointments.   Follow-Up Appointments:     Follow-up Information    Follow up with Lorna Few, DO On 11/25/2014.   Specialty:  Family Medicine   Why:  @4pm  for Hospital Follow Up   Contact information:   [address] [place] [id number] [place], DO 11/20/2014, 10:38 PM PGY-1, [place]

## 2014-11-24 ENCOUNTER — Other Ambulatory Visit: Payer: Self-pay | Admitting: Obstetrics and Gynecology

## 2014-11-25 ENCOUNTER — Inpatient Hospital Stay: Payer: Self-pay | Admitting: Family Medicine

## 2014-11-28 LAB — GLUCOSE, CAPILLARY: Glucose-Capillary: 241 mg/dL — ABNORMAL HIGH (ref 70–99)

## 2014-11-30 ENCOUNTER — Encounter: Payer: Self-pay | Admitting: Family Medicine

## 2014-11-30 NOTE — Progress Notes (Signed)
Patient is now wanting all of his prescriptions sent to the health dept. He now has the orange card.  He is also wanting to add viagara back to his list of medications.  He had not been getting it because he could not afford it but will be able to now.

## 2014-12-01 ENCOUNTER — Other Ambulatory Visit: Payer: Self-pay | Admitting: Family Medicine

## 2014-12-05 ENCOUNTER — Other Ambulatory Visit: Payer: Self-pay | Admitting: Family Medicine

## 2014-12-05 MED ORDER — INSULIN GLARGINE 100 UNIT/ML ~~LOC~~ SOLN: 20.0000 [IU] | Freq: Every day | SUBCUTANEOUS | Status: DC

## 2014-12-05 MED ORDER — METOPROLOL SUCCINATE ER 100 MG PO TB24: ORAL_TABLET | ORAL | Status: DC

## 2014-12-05 MED ORDER — PRAVASTATIN SODIUM 80 MG PO TABS: 80.0000 mg | ORAL_TABLET | Freq: Every day | ORAL | Status: DC

## 2014-12-05 MED ORDER — OMEPRAZOLE 20 MG PO CPDR: 20.0000 mg | DELAYED_RELEASE_CAPSULE | Freq: Every day | ORAL | Status: DC

## 2014-12-05 NOTE — Progress Notes (Signed)
Pt asked to have all his prescriptions transferred to [facility]. I have completed his request with the exception of his ativan, which he will need to transferred from his current pharmacy to the health department.  He also asked for Viagra to be refilled. This was a medication that was given to him prior to his cardiac arrest, he will need cardiology to clear him for this medication now. Please call and make pt aware of the above. Thanks. Meda Dudzinski DO

## 2014-12-05 NOTE — Progress Notes (Signed)
Pt informed that all medications except for Ativan and Viagra was transferred to [facility].  Pt advised to get cardiac clearance from his cardiologist before Viagra can be filled.  Pt stated understanding.  Derl Barrow, RN

## 2014-12-16 ENCOUNTER — Inpatient Hospital Stay: Payer: Self-pay | Admitting: Family Medicine

## 2014-12-16 ENCOUNTER — Ambulatory Visit (INDEPENDENT_AMBULATORY_CARE_PROVIDER_SITE_OTHER): Payer: Self-pay | Admitting: Family Medicine

## 2014-12-16 ENCOUNTER — Encounter: Payer: Self-pay | Admitting: Family Medicine

## 2014-12-16 VITALS — BP 140/87 | HR 105 | Temp 98.1°F | Ht 70.0 in | Wt 250.1 lb

## 2014-12-16 DIAGNOSIS — K2991 Gastroduodenitis, unspecified, with bleeding: Secondary | ICD-10-CM

## 2014-12-16 MED ORDER — OMEPRAZOLE 20 MG PO CPDR: 20.0000 mg | DELAYED_RELEASE_CAPSULE | Freq: Every day | ORAL | Status: DC

## 2014-12-16 NOTE — Patient Instructions (Signed)
Thank you so much for coming to visit me today! I'm glad your GI bleeding is better! I have sent in a prescription for Prilosec, your stomach medicine. Please discuss your anxiety and insomnia with Dr. Raoul Pitch.   Thanks again! Dr. Gerlean Ren

## 2014-12-18 NOTE — Assessment & Plan Note (Addendum)
-   No further symptoms noted. Denies further bleeding, dizziness, vomiting, fatigue. - Refilled Omeprazole and faxed to HD. Discussed that he should call before he runs out of his medications so refills can be sent in before he is out - Able to afford medications with help of [employer]. - Schedule follow up appointment with PCP to discuss anxiety and insomnia

## 2014-12-18 NOTE — Progress Notes (Signed)
Subjective:     Patient ID: Jeffrey [name], male   DOB: Mar 10, 1966, 49 y.o.   MRN: [id number]  HPI Jeffrey [name] is a 49yo male presenting for hospital follow up for GI bleed. - Hospitalized from 2/10-2/12 for GI bleed. Endoscopy showed class C esophagitis, hiatal hernia, and mild duodenitis. GI recommended PI at discharge. - Denies any further episodes of blood in stool or dark stool since discharge - Diarrhea has resolved after course of antibiotics - Denies abdominal pain - Denies dizziness or fatigue - States he has ran out of PPI. Was scheduled to follow up earlier for refill but wife has been in hospital and he had to cancel - Recently obtained orange card, which is helping him afford medications - Would like to schedule appointment to consider restarting anxiety medication and discuss insomnia  Review of Systems  Constitutional: Negative for fatigue.  Gastrointestinal: Negative for abdominal pain, diarrhea and blood in stool.  Neurological: Negative for dizziness.  Psychiatric/Behavioral: The patient is nervous/anxious.        Objective:   Physical Exam  Constitutional: He appears well-developed. No distress.  Cardiovascular: Normal rate and regular rhythm.  Exam reveals no gallop and no friction rub.   No murmur heard. Pulmonary/Chest: Effort normal. No respiratory distress. He has no wheezes. He has rales.  Abdominal: Soft. He exhibits no distension. There is no tenderness.  Musculoskeletal: He exhibits no edema.  Skin: Skin is warm. No rash noted. He is not diaphoretic. No erythema.  Psychiatric: He has a normal mood and affect. His behavior is normal.      Assessment:     Please refer to Problem List for Assessment.    Plan:     Please refer to Problem List for Plan.

## 2015-01-03 ENCOUNTER — Ambulatory Visit: Payer: Self-pay | Admitting: Family Medicine

## 2015-02-08 IMAGING — CR DG CHEST 2V
2 series · 2 of 2 positions shown · non-contrast
Comparison: August 22, 2014

CLINICAL DATA: Near syncope this morning. Vomiting and cough for 3
days.

EXAM:
CHEST  2 VIEW

[chest lat]
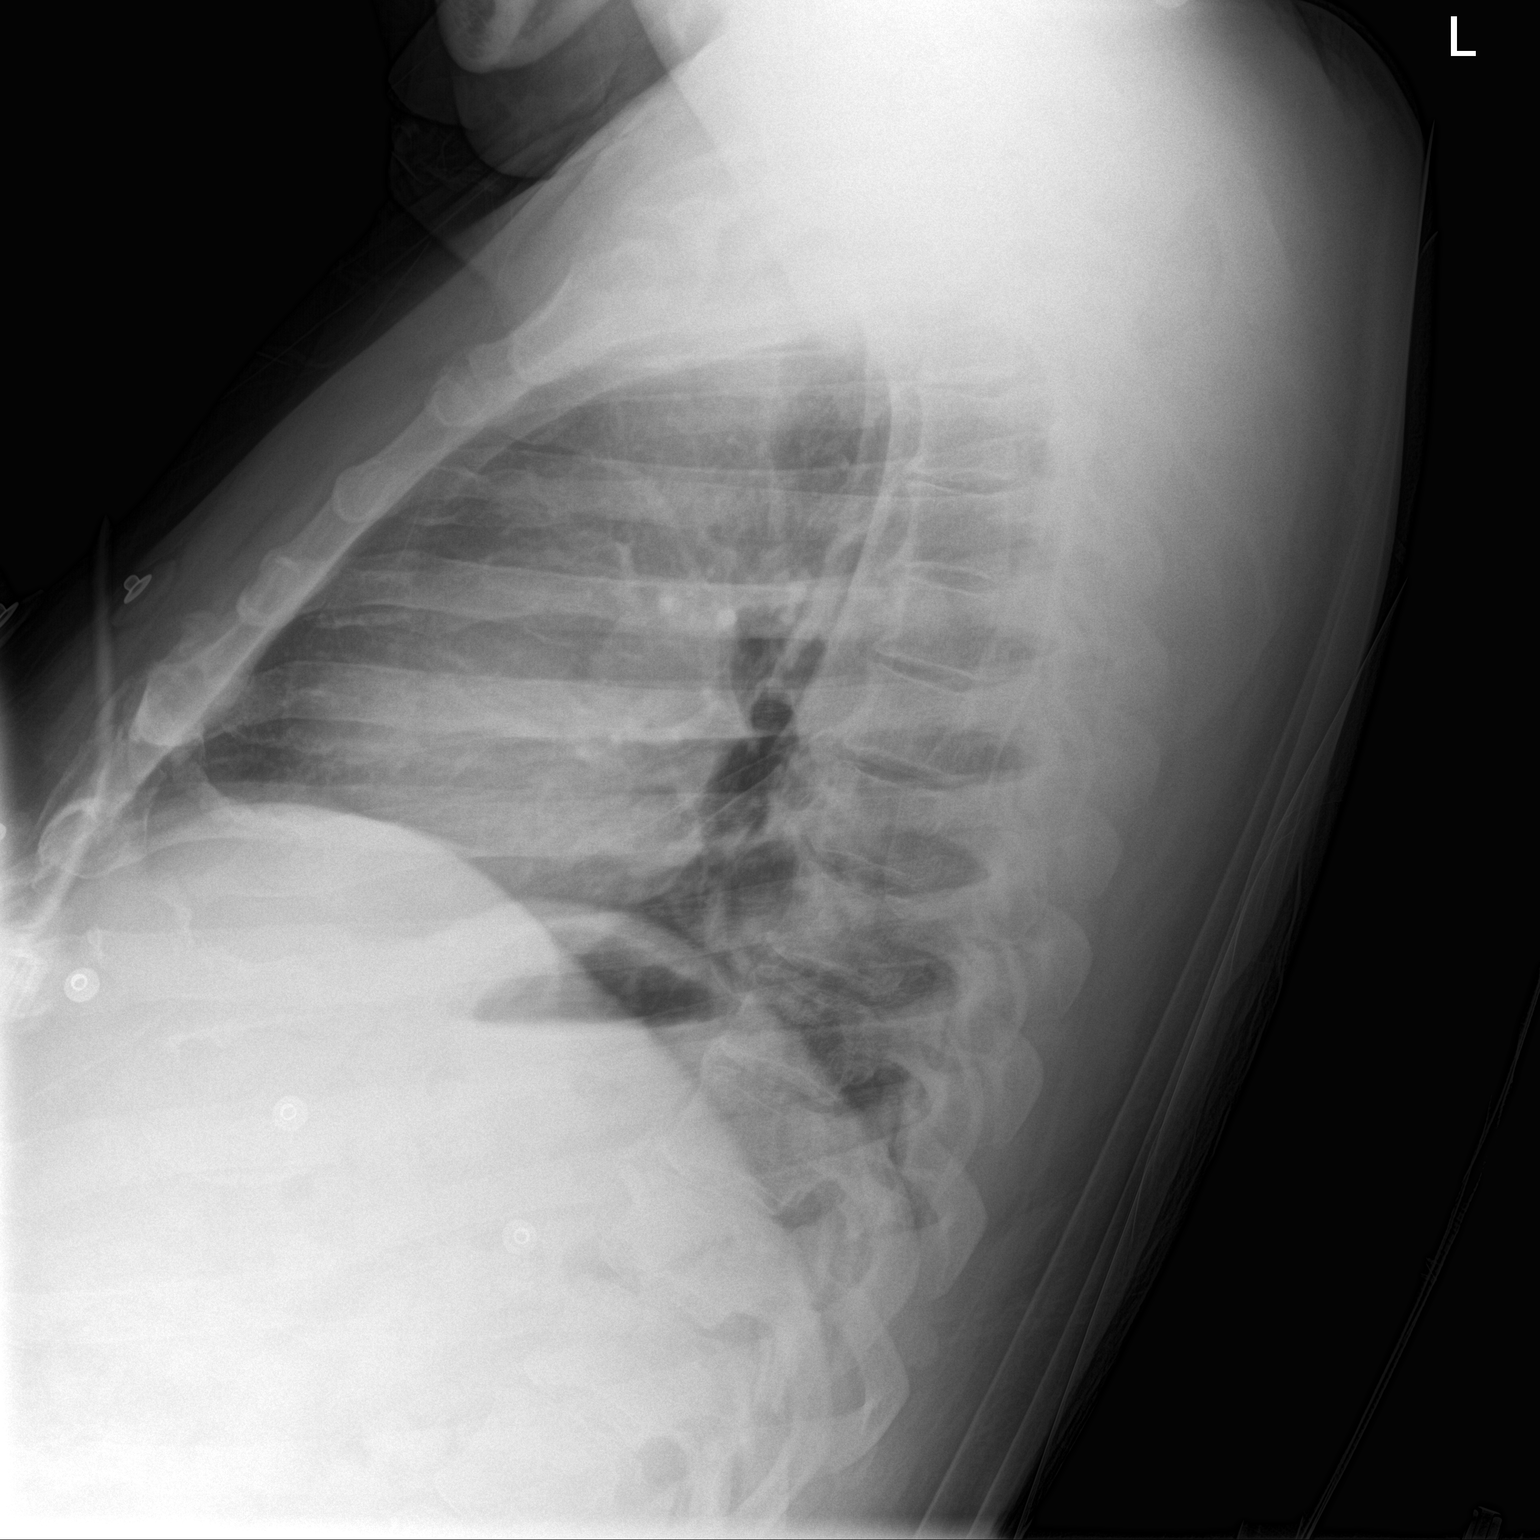

[chest ap]
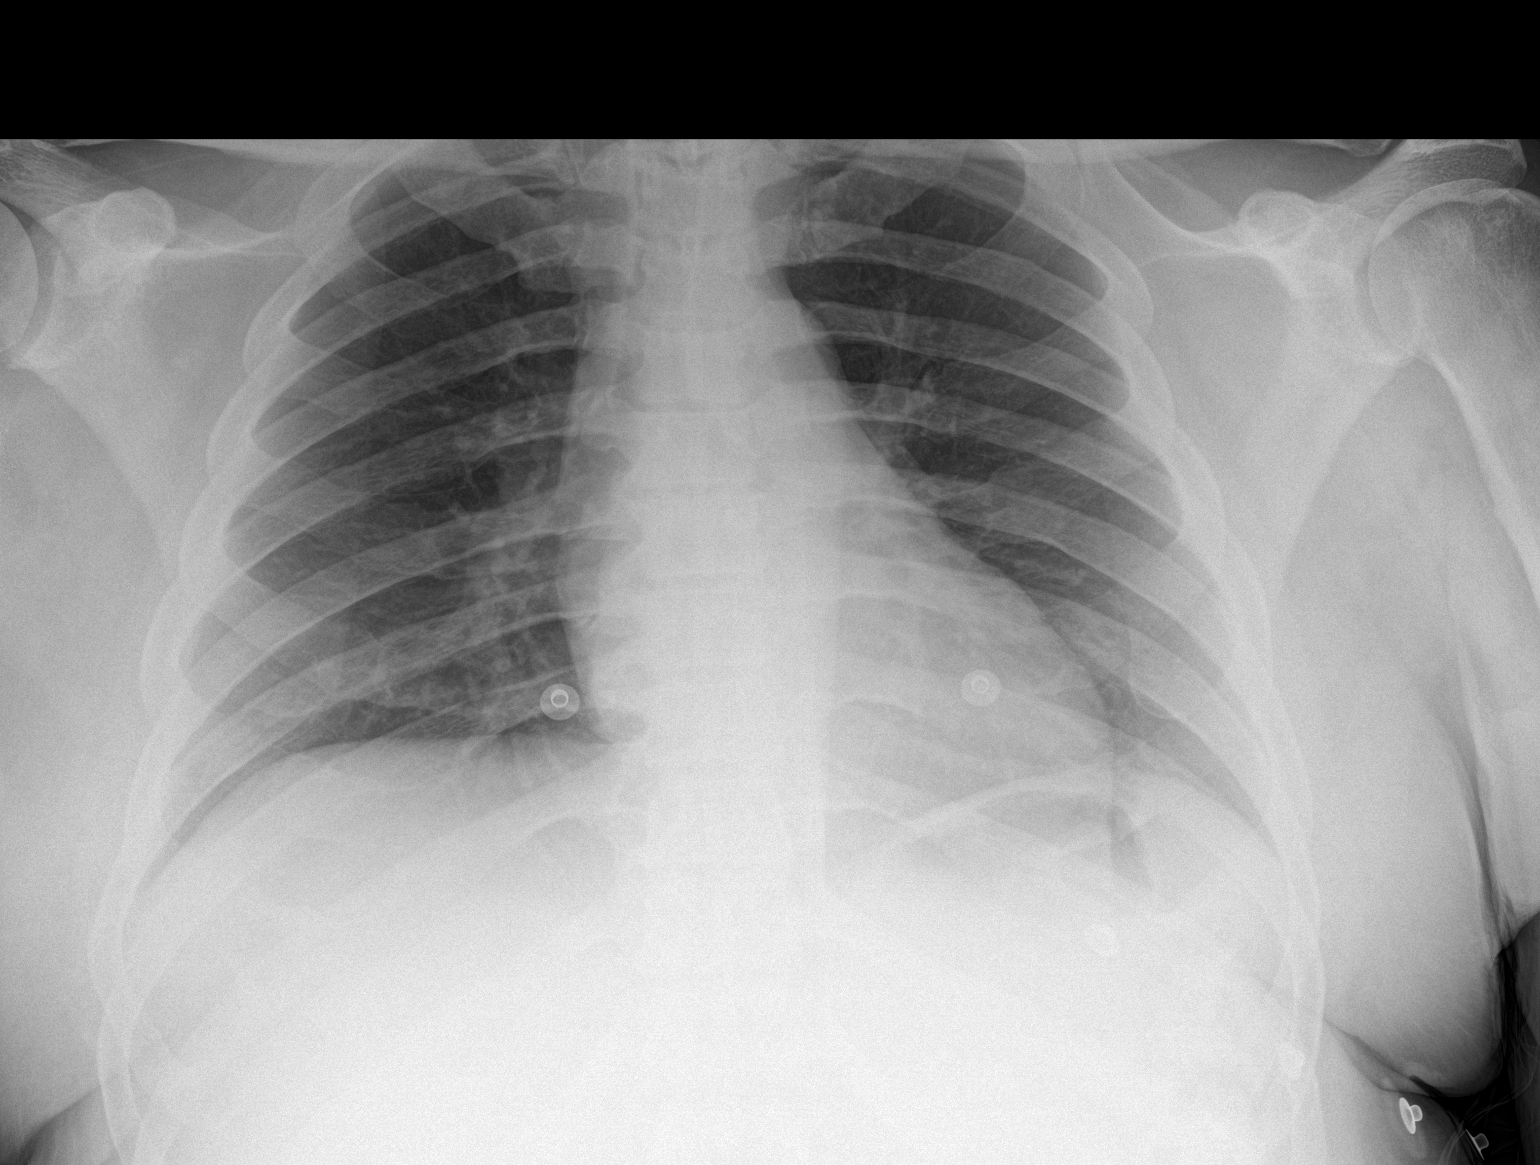

[2 of 2 positions shown; findings below may reference images not displayed]

FINDINGS: The heart size and mediastinal contours are within normal limits.
There is no focal infiltrate, pulmonary edema, or pleural effusion.
The visualized skeletal structures are unremarkable.
IMPRESSION: No active cardiopulmonary disease.

## 2015-03-09 ENCOUNTER — Emergency Department (HOSPITAL_COMMUNITY): Payer: Self-pay

## 2015-03-09 ENCOUNTER — Encounter (HOSPITAL_COMMUNITY): Payer: Self-pay

## 2015-03-09 ENCOUNTER — Other Ambulatory Visit (HOSPITAL_COMMUNITY): Payer: Self-pay

## 2015-03-09 ENCOUNTER — Inpatient Hospital Stay (HOSPITAL_COMMUNITY)
Admission: EM | Admit: 2015-03-09 | Discharge: 2015-03-12 | DRG: 896 | Disposition: A | Discharge status: Home / Self Care | Payer: Self-pay | Attending: Family Medicine | Admitting: Family Medicine

## 2015-03-09 DIAGNOSIS — F10929 Alcohol use, unspecified with intoxication, unspecified: Secondary | ICD-10-CM | POA: Insufficient documentation

## 2015-03-09 DIAGNOSIS — R4701 Aphasia: Secondary | ICD-10-CM | POA: Insufficient documentation

## 2015-03-09 DIAGNOSIS — Z79899 Other long term (current) drug therapy: Secondary | ICD-10-CM

## 2015-03-09 DIAGNOSIS — E669 Obesity, unspecified: Secondary | ICD-10-CM | POA: Diagnosis present

## 2015-03-09 DIAGNOSIS — R270 Ataxia, unspecified: Secondary | ICD-10-CM

## 2015-03-09 DIAGNOSIS — E1140 Type 2 diabetes mellitus with diabetic neuropathy, unspecified: Secondary | ICD-10-CM

## 2015-03-09 DIAGNOSIS — E7800 Pure hypercholesterolemia, unspecified: Secondary | ICD-10-CM | POA: Diagnosis present

## 2015-03-09 DIAGNOSIS — E872 Acidosis: Secondary | ICD-10-CM | POA: Diagnosis present

## 2015-03-09 DIAGNOSIS — F419 Anxiety disorder, unspecified: Secondary | ICD-10-CM | POA: Diagnosis present

## 2015-03-09 DIAGNOSIS — R748 Abnormal levels of other serum enzymes: Secondary | ICD-10-CM | POA: Insufficient documentation

## 2015-03-09 DIAGNOSIS — E1165 Type 2 diabetes mellitus with hyperglycemia: Secondary | ICD-10-CM | POA: Diagnosis present

## 2015-03-09 DIAGNOSIS — Z6835 Body mass index (BMI) 35.0-35.9, adult: Secondary | ICD-10-CM

## 2015-03-09 DIAGNOSIS — Z8674 Personal history of sudden cardiac arrest: Secondary | ICD-10-CM

## 2015-03-09 DIAGNOSIS — F329 Major depressive disorder, single episode, unspecified: Secondary | ICD-10-CM | POA: Diagnosis present

## 2015-03-09 DIAGNOSIS — G931 Anoxic brain damage, not elsewhere classified: Secondary | ICD-10-CM | POA: Diagnosis present

## 2015-03-09 DIAGNOSIS — G4733 Obstructive sleep apnea (adult) (pediatric): Secondary | ICD-10-CM | POA: Diagnosis present

## 2015-03-09 DIAGNOSIS — R471 Dysarthria and anarthria: Secondary | ICD-10-CM

## 2015-03-09 DIAGNOSIS — I10 Essential (primary) hypertension: Secondary | ICD-10-CM | POA: Diagnosis present

## 2015-03-09 DIAGNOSIS — E869 Volume depletion, unspecified: Secondary | ICD-10-CM | POA: Diagnosis present

## 2015-03-09 DIAGNOSIS — M25519 Pain in unspecified shoulder: Secondary | ICD-10-CM

## 2015-03-09 DIAGNOSIS — M1990 Unspecified osteoarthritis, unspecified site: Secondary | ICD-10-CM | POA: Diagnosis present

## 2015-03-09 DIAGNOSIS — Y908 Blood alcohol level of 240 mg/100 ml or more: Secondary | ICD-10-CM | POA: Diagnosis present

## 2015-03-09 DIAGNOSIS — E785 Hyperlipidemia, unspecified: Secondary | ICD-10-CM | POA: Diagnosis present

## 2015-03-09 DIAGNOSIS — G928 Other toxic encephalopathy: Secondary | ICD-10-CM | POA: Diagnosis present

## 2015-03-09 DIAGNOSIS — Z8673 Personal history of transient ischemic attack (TIA), and cerebral infarction without residual deficits: Secondary | ICD-10-CM

## 2015-03-09 DIAGNOSIS — G92 Toxic encephalopathy: Secondary | ICD-10-CM | POA: Diagnosis present

## 2015-03-09 DIAGNOSIS — Z794 Long term (current) use of insulin: Secondary | ICD-10-CM

## 2015-03-09 DIAGNOSIS — E871 Hypo-osmolality and hyponatremia: Secondary | ICD-10-CM | POA: Diagnosis present

## 2015-03-09 DIAGNOSIS — K7010 Alcoholic hepatitis without ascites: Secondary | ICD-10-CM | POA: Diagnosis present

## 2015-03-09 DIAGNOSIS — N179 Acute kidney failure, unspecified: Secondary | ICD-10-CM | POA: Insufficient documentation

## 2015-03-09 DIAGNOSIS — K219 Gastro-esophageal reflux disease without esophagitis: Secondary | ICD-10-CM | POA: Diagnosis present

## 2015-03-09 DIAGNOSIS — F10129 Alcohol abuse with intoxication, unspecified: Principal | ICD-10-CM | POA: Diagnosis present

## 2015-03-09 HISTORY — DX: Anoxic brain damage, not elsewhere classified: G93.1

## 2015-03-09 LAB — URINALYSIS, ROUTINE W REFLEX MICROSCOPIC
GLUCOSE, UA: NEGATIVE mg/dL
KETONES UR: 40 mg/dL — AB
Nitrite: POSITIVE — AB
Specific Gravity, Urine: 1.035 — ABNORMAL HIGH (ref 1.005–1.030)
Urobilinogen, UA: 4 mg/dL — ABNORMAL HIGH (ref 0.0–1.0)
pH: 5 (ref 5.0–8.0)

## 2015-03-09 LAB — RAPID URINE DRUG SCREEN, HOSP PERFORMED
AMPHETAMINES: NOT DETECTED
BARBITURATES: NOT DETECTED
Benzodiazepines: NOT DETECTED
COCAINE: NOT DETECTED
Opiates: NOT DETECTED
TETRAHYDROCANNABINOL: NOT DETECTED

## 2015-03-09 LAB — DIFFERENTIAL
BASOS PCT: 1 % (ref 0–1)
Basophils Absolute: 0.1 10*3/uL (ref 0.0–0.1)
EOS ABS: 0.2 10*3/uL (ref 0.0–0.7)
Eosinophils Relative: 2 % (ref 0–5)
Lymphocytes Relative: 25 % (ref 12–46)
Lymphs Abs: 2.5 10*3/uL (ref 0.7–4.0)
MONO ABS: 1.3 10*3/uL — AB (ref 0.1–1.0)
Monocytes Relative: 13 % — ABNORMAL HIGH (ref 3–12)
Neutro Abs: 5.9 10*3/uL (ref 1.7–7.7)
Neutrophils Relative %: 60 % (ref 43–77)

## 2015-03-09 LAB — URINE MICROSCOPIC-ADD ON

## 2015-03-09 LAB — COMPREHENSIVE METABOLIC PANEL
ALBUMIN: 3.1 g/dL — AB (ref 3.5–5.0)
ALK PHOS: 258 U/L — AB (ref 38–126)
ALK PHOS: 222 U/L — AB (ref 38–126)
ALT: 162 U/L — AB (ref 17–63)
ALT: 143 U/L — ABNORMAL HIGH (ref 17–63)
ANION GAP: 16 — AB (ref 5–15)
AST: 345 U/L — AB (ref 15–41)
AST: 310 U/L — ABNORMAL HIGH (ref 15–41)
Albumin: 2.7 g/dL — ABNORMAL LOW (ref 3.5–5.0)
Anion gap: 24 — ABNORMAL HIGH (ref 5–15)
BUN: 14 mg/dL (ref 6–20)
BUN: 13 mg/dL (ref 6–20)
CALCIUM: 8.9 mg/dL (ref 8.9–10.3)
CO2: 22 mmol/L (ref 22–32)
CO2: 25 mmol/L (ref 22–32)
CREATININE: 1.67 mg/dL — AB (ref 0.61–1.24)
Calcium: 8.2 mg/dL — ABNORMAL LOW (ref 8.9–10.3)
Chloride: 84 mmol/L — ABNORMAL LOW (ref 101–111)
Chloride: 93 mmol/L — ABNORMAL LOW (ref 101–111)
Creatinine, Ser: 1.37 mg/dL — ABNORMAL HIGH (ref 0.61–1.24)
GFR calc Af Amer: 54 mL/min — ABNORMAL LOW (ref >60)
GFR calc Af Amer: >60 mL/min (ref >60)
GFR calc non Af Amer: 46 mL/min — ABNORMAL LOW (ref >60)
GFR, EST NON AFRICAN AMERICAN: 59 mL/min — AB (ref >60)
GLUCOSE: 90 mg/dL (ref 65–99)
Glucose, Bld: 105 mg/dL — ABNORMAL HIGH (ref 65–99)
Potassium: 3.9 mmol/L (ref 3.5–5.1)
Potassium: 3.5 mmol/L (ref 3.5–5.1)
SODIUM: 130 mmol/L — AB (ref 135–145)
Sodium: 134 mmol/L — ABNORMAL LOW (ref 135–145)
TOTAL PROTEIN: 7.9 g/dL (ref 6.5–8.1)
Total Bilirubin: 5.9 mg/dL — ABNORMAL HIGH (ref 0.3–1.2)
Total Bilirubin: 5 mg/dL — ABNORMAL HIGH (ref 0.3–1.2)
Total Protein: 6.4 g/dL — ABNORMAL LOW (ref 6.5–8.1)

## 2015-03-09 LAB — CBC
HCT: 37 % — ABNORMAL LOW (ref 39.0–52.0)
HEMATOCRIT: 34.2 % — AB (ref 39.0–52.0)
HEMOGLOBIN: 11.7 g/dL — AB (ref 13.0–17.0)
Hemoglobin: 12.6 g/dL — ABNORMAL LOW (ref 13.0–17.0)
MCH: 31.4 pg (ref 26.0–34.0)
MCH: 31.5 pg (ref 26.0–34.0)
MCHC: 34.1 g/dL (ref 30.0–36.0)
MCHC: 34.2 g/dL (ref 30.0–36.0)
MCV: 92.3 fL (ref 78.0–100.0)
MCV: 92.2 fL (ref 78.0–100.0)
PLATELETS: 136 10*3/uL — AB (ref 150–400)
Platelets: 136 10*3/uL — ABNORMAL LOW (ref 150–400)
RBC: 4.01 MIL/uL — AB (ref 4.22–5.81)
RBC: 3.71 MIL/uL — ABNORMAL LOW (ref 4.22–5.81)
RDW: 19.6 % — AB (ref 11.5–15.5)
RDW: 19.5 % — AB (ref 11.5–15.5)
WBC: 9.9 10*3/uL (ref 4.0–10.5)
WBC: 7.6 10*3/uL (ref 4.0–10.5)

## 2015-03-09 LAB — CBG MONITORING, ED
GLUCOSE-CAPILLARY: 101 mg/dL — AB (ref 65–99)
Glucose-Capillary: 113 mg/dL — ABNORMAL HIGH (ref 65–99)

## 2015-03-09 LAB — I-STAT TROPONIN, ED: TROPONIN I, POC: 0.02 ng/mL (ref 0.00–0.08)

## 2015-03-09 LAB — I-STAT CHEM 8, ED
BUN: 15 mg/dL (ref 6–20)
CREATININE: 2.3 mg/dL — AB (ref 0.61–1.24)
Calcium, Ion: 0.94 mmol/L — ABNORMAL LOW (ref 1.12–1.23)
Chloride: 89 mmol/L — ABNORMAL LOW (ref 101–111)
Glucose, Bld: 110 mg/dL — ABNORMAL HIGH (ref 65–99)
HEMATOCRIT: 48 % (ref 39.0–52.0)
Hemoglobin: 16.3 g/dL (ref 13.0–17.0)
Potassium: 3.8 mmol/L (ref 3.5–5.1)
Sodium: 130 mmol/L — ABNORMAL LOW (ref 135–145)
TCO2: 23 mmol/L (ref 0–100)

## 2015-03-09 LAB — PROTIME-INR
INR: 1.46 (ref 0.00–1.49)
Prothrombin Time: 17.8 seconds — ABNORMAL HIGH (ref 11.6–15.2)

## 2015-03-09 LAB — AMMONIA: Ammonia: 48 umol/L — ABNORMAL HIGH (ref 9–35)

## 2015-03-09 LAB — FOLATE: Folate: 19.5 ng/mL (ref >5.9)

## 2015-03-09 LAB — LACTIC ACID, PLASMA
LACTIC ACID, VENOUS: 2.5 mmol/L — AB (ref 0.5–2.0)
Lactic Acid, Venous: 3.3 mmol/L (ref 0.5–2.0)

## 2015-03-09 LAB — GLUCOSE, CAPILLARY: Glucose-Capillary: 99 mg/dL (ref 65–99)

## 2015-03-09 LAB — TROPONIN I: Troponin I: 0.03 ng/mL (ref <0.031)

## 2015-03-09 LAB — APTT: aPTT: 33 seconds (ref 24–37)

## 2015-03-09 LAB — VITAMIN B12: Vitamin B-12: 2648 pg/mL — ABNORMAL HIGH (ref 180–914)

## 2015-03-09 LAB — ETHANOL: Alcohol, Ethyl (B): 254 mg/dL — ABNORMAL HIGH (ref <5)

## 2015-03-09 MED ORDER — LORAZEPAM 2 MG/ML IJ SOLN: 0.0000 mg | Freq: Two times a day (BID) | INTRAMUSCULAR | Status: DC

## 2015-03-09 MED ORDER — LORAZEPAM 2 MG/ML IJ SOLN: 0.0000 mg | Freq: Four times a day (QID) | INTRAMUSCULAR | Status: DC

## 2015-03-09 MED ORDER — HEPARIN SODIUM (PORCINE) 5000 UNIT/ML IJ SOLN
5000.0000 [IU] | Freq: Three times a day (TID) | INTRAMUSCULAR | Status: DC
  Administered 2015-03-09 – 2015-03-12 (×9): 5000 [IU] via SUBCUTANEOUS
  Filled 2015-03-09 (×12): qty 1

## 2015-03-09 MED ORDER — INSULIN GLARGINE 100 UNIT/ML ~~LOC~~ SOLN
10.0000 [IU] | Freq: Every day | SUBCUTANEOUS | Status: DC
  Administered 2015-03-09 – 2015-03-11 (×3): 10 [IU] via SUBCUTANEOUS
  Filled 2015-03-09 (×4): qty 0.1

## 2015-03-09 MED ORDER — LORAZEPAM 1 MG PO TABS: 0.0000 mg | ORAL_TABLET | Freq: Two times a day (BID) | ORAL | Status: DC

## 2015-03-09 MED ORDER — SODIUM CHLORIDE 0.9 % IV BOLUS (SEPSIS)
1000.0000 mL | Freq: Once | INTRAVENOUS | Status: AC
  Administered 2015-03-09: 1000 mL via INTRAVENOUS

## 2015-03-09 MED ORDER — PANTOPRAZOLE SODIUM 40 MG PO TBEC
40.0000 mg | DELAYED_RELEASE_TABLET | Freq: Every day | ORAL | Status: DC
  Administered 2015-03-10 – 2015-03-12 (×3): 40 mg via ORAL
  Filled 2015-03-09 (×3): qty 1

## 2015-03-09 MED ORDER — LORAZEPAM 1 MG PO TABS
1.0000 mg | ORAL_TABLET | Freq: Four times a day (QID) | ORAL | Status: DC | PRN
Start: 2015-03-09 — End: 2015-03-12
  Administered 2015-03-10: 1 mg via ORAL
  Filled 2015-03-09: qty 2

## 2015-03-09 MED ORDER — LORAZEPAM 1 MG PO TABS: 0.0000 mg | ORAL_TABLET | Freq: Four times a day (QID) | ORAL | Status: DC

## 2015-03-09 MED ORDER — VITAMIN B-1 100 MG PO TABS: 100.0000 mg | ORAL_TABLET | Freq: Every day | ORAL | Status: DC

## 2015-03-09 MED ORDER — SODIUM CHLORIDE 0.9 % IV BOLUS (SEPSIS)
1000.0000 mL | Freq: Once | INTRAVENOUS | Status: AC
Start: 2015-03-09 — End: 2015-03-09
  Administered 2015-03-09: 1000 mL via INTRAVENOUS

## 2015-03-09 MED ORDER — ENSURE ENLIVE PO LIQD
237.0000 mL | Freq: Two times a day (BID) | ORAL | Status: DC
  Administered 2015-03-10 – 2015-03-12 (×4): 237 mL via ORAL

## 2015-03-09 MED ORDER — ADULT MULTIVITAMIN W/MINERALS CH
1.0000 | ORAL_TABLET | Freq: Every day | ORAL | Status: DC
  Administered 2015-03-10 – 2015-03-12 (×3): 1 via ORAL
  Filled 2015-03-09 (×3): qty 1

## 2015-03-09 MED ORDER — THIAMINE HCL 100 MG/ML IJ SOLN: 100.0000 mg | Freq: Every day | INTRAMUSCULAR | Status: DC

## 2015-03-09 MED ORDER — FOLIC ACID 1 MG PO TABS
1.0000 mg | ORAL_TABLET | Freq: Every day | ORAL | Status: DC
  Administered 2015-03-10 – 2015-03-12 (×3): 1 mg via ORAL
  Filled 2015-03-09 (×3): qty 1

## 2015-03-09 MED ORDER — SODIUM CHLORIDE 0.9 % IJ SOLN
3.0000 mL | Freq: Two times a day (BID) | INTRAMUSCULAR | Status: DC
  Administered 2015-03-09 – 2015-03-11 (×4): 3 mL via INTRAVENOUS

## 2015-03-09 MED ORDER — INSULIN ASPART 100 UNIT/ML ~~LOC~~ SOLN
0.0000 [IU] | Freq: Three times a day (TID) | SUBCUTANEOUS | Status: DC
  Administered 2015-03-11 – 2015-03-12 (×5): 3 [IU] via SUBCUTANEOUS

## 2015-03-09 MED ORDER — LORAZEPAM 2 MG/ML IJ SOLN: 1.0000 mg | Freq: Four times a day (QID) | INTRAMUSCULAR | Status: DC | PRN

## 2015-03-09 MED ORDER — SODIUM CHLORIDE 0.9 % IV SOLN
INTRAVENOUS | Status: DC
  Administered 2015-03-09 – 2015-03-10 (×3): via INTRAVENOUS

## 2015-03-09 MED ORDER — METOPROLOL SUCCINATE ER 100 MG PO TB24
100.0000 mg | ORAL_TABLET | Freq: Every day | ORAL | Status: DC
  Administered 2015-03-09 – 2015-03-10 (×2): 100 mg via ORAL
  Filled 2015-03-09 (×4): qty 1

## 2015-03-09 MED ORDER — THIAMINE HCL 100 MG/ML IJ SOLN
100.0000 mg | Freq: Every day | INTRAMUSCULAR | Status: DC
  Administered 2015-03-09 – 2015-03-10 (×2): 100 mg via INTRAVENOUS
  Filled 2015-03-09: qty 2
  Filled 2015-03-09: qty 1

## 2015-03-09 MED ORDER — PHYTONADIONE 5 MG PO TABS
5.0000 mg | ORAL_TABLET | Freq: Every day | ORAL | Status: AC
  Administered 2015-03-10 – 2015-03-11 (×2): 5 mg via ORAL
  Filled 2015-03-09 (×3): qty 1

## 2015-03-09 NOTE — ED Provider Notes (Signed)
CSN: [id number]     Arrival date & time 03/09/15  0536 History   First MD Initiated Contact with Patient 03/09/15 0615     Chief Complaint  Patient presents with  . Fall  . Aphasia     (Consider location/radiation/quality/duration/timing/severity/associated sxs/prior Treatment) The history is provided by the patient. No language interpreter was used.  Jeffrey [name] is a 49 y/o M with PMHx of HTN, blood transfusion, cardiac arrest in 2014, DM, bleeding ulcer presenting to the ED with slurred speech that has gotten worse for the past 2-3 weeks. Patient reported that 2-3 weeks ago he woke up and was not able to communicate or move his arms. Patient reported that he had a "mini stroke." When asked why the patient did not come to the ED then, patient reported that his daughter was the only one home and stated that his wife has a lot going on as well. Reported that he thought his sugars were low, but stated that when he checked they were 309. Patient reported that as the weeks have gone on he has noticed increase in slurred speech. Reported that he did drink 2 beers yesterday while watching a game with his friend, but states that he does not drink normally. Denied cigarette use, cocaine, marijuana, heroin, ethanol. Reports he does chew tobacco. Denied fall, chest pain, difficulty breathing, nausea, vomiting, diarrhea, don pain, headache, dizziness, blurred vision, sudden loss of vision, blood thinners, confusion, disorientation, weakness, numbness, tingling. PCP Dr. Raoul Pitch  Past Medical History  Diagnosis Date  . Hypertension   . Blood transfusion 2014    "related to bleeding ulcer"  . Venous insufficiency 12/13/2011  . Cardiac arrest 12/07/2012  . Anxiety   . Acute renal insufficiency 12/08/2012  . High cholesterol   . OSA on CPAP 12/07/2012  . Type II diabetes mellitus   . Bleeding ulcer 2014  . GERD (gastroesophageal reflux disease)    Past Surgical History  Procedure Laterality Date  . Knee  arthroscopy Left ~ 1999  . Esophagogastroduodenoscopy Left 11/26/2012    Procedure: ESOPHAGOGASTRODUODENOSCOPY (EGD);  Surgeon: Wonda Horner, MD;  Location: [facility] ENDOSCOPY;  Service: Endoscopy;  Laterality: Left;  . Cardiovascular stress test  03/16/13    Very Poor Exercise Tolerance; NON DIAGNOSTIC TEST  . Cataract extraction w/ intraocular lens implant Left 03/07/2014    [facility] @ [facility]  . Esophagogastroduodenoscopy N/A 10/10/2014    Procedure: ESOPHAGOGASTRODUODENOSCOPY (EGD);  Surgeon: Ladene Artist, MD;  Location: [facility] ENDOSCOPY;  Service: Endoscopy;  Laterality: N/A;   No family history on file. History  Substance Use Topics  . Smoking status: Never Smoker   . Smokeless tobacco: Current User    Types: Snuff  . Alcohol Use: 3.6 oz/week    6 Cans of beer per week    Review of Systems  Respiratory: Positive for shortness of breath (chronic - for years). Negative for chest tightness.   Cardiovascular: Negative for chest pain.  Gastrointestinal: Negative for nausea, vomiting, abdominal pain and diarrhea.  Neurological: Positive for speech difficulty. Negative for dizziness, weakness and headaches.      Allergies  Lisinopril  Home Medications   Prior to Admission medications   Medication Sig Start Date End Date Taking? Authorizing Provider  insulin glargine (LANTUS) 100 UNIT/ML injection Inject 0.2 mLs (20 Units total) into the skin daily. Patient taking differently: Inject 10 Units into the skin at bedtime.  12/05/14  Yes Renee A Kuneff, DO  metoprolol succinate (TOPROL-XL) 100  MG 24 hr tablet TAKE ONE TABLET BY MOUTH DAILY. TAKE WITH OR IMMEDIATELY FOLLOWING A MEAL Patient taking differently: at bedtime. TAKE ONE TABLET BY MOUTH DAILY. TAKE WITH OR IMMEDIATELY FOLLOWING A MEAL 12/05/14  Yes Renee A Kuneff, DO  omeprazole (PRILOSEC) 20 MG capsule Take 1 capsule (20 mg total) by mouth daily. Patient taking differently: Take 20 mg by mouth at bedtime.  12/16/14  Yes  [place] N Rumley, DO  pravastatin (PRAVACHOL) 80 MG tablet Take 1 tablet (80 mg total) by mouth daily. Patient taking differently: Take 80 mg by mouth at bedtime.  12/05/14  Yes Renee A Kuneff, DO  glucose blood test strip Use as instructed 08/25/14   Archie Patten, MD  LORazepam (ATIVAN) 1 MG tablet Take 60 tablets (60 mg total) by mouth every 6 (six) hours as needed for anxiety. 11/18/14   [place] N Rumley, DO   BP 106/77 mmHg  Pulse 93  Temp(Src) 98.5 F (36.9 C)  Resp 18  Ht 5\' 10"  (1.778 m)  Wt 250 lb (113.399 kg)  BMI 35.87 kg/m2  SpO2 97% Physical Exam  Constitutional: He is oriented to person, place, and time. He appears well-developed and well-nourished. No distress.  HENT:  Head: Normocephalic and atraumatic.  Mouth/Throat: Oropharynx is clear and moist. No oropharyngeal exudate.  Eyes: Conjunctivae and EOM are normal. Pupils are equal, round, and reactive to light. Right eye exhibits no discharge. Left eye exhibits no discharge.  Neck: Normal range of motion. Neck supple.  Cardiovascular: Regular rhythm and normal heart sounds.  Tachycardia present.   No murmur heard. Pulses:      Radial pulses are 2+ on the right side, and 2+ on the left side.       Dorsalis pedis pulses are 2+ on the right side, and 2+ on the left side.  Cap refill < 3 seconds  Pulmonary/Chest: Effort normal and breath sounds normal. No respiratory distress. He has no wheezes. He has no rales.  Musculoskeletal: Normal range of motion.  Neurological: He is alert and oriented to person, place, and time. No cranial nerve deficit. He exhibits normal muscle tone. Coordination normal.  Cranial nerves III-XII grossly intact Strength 5+/5+ to upper and lower extremities bilaterally with resistance applied, equal distribution noted Equal grip strength bilaterally Negative facial droop Positive slurred speech Hesitation with speech and explaining situations Patient follow commands well Patient responds to  questions appropriately Patient is able to bring finger to nose bilaterally without difficulty, mild ataxia noted with bringing finger closer to nose Ataxia noted with bringing finger to nose to this provider's finger  Skin: Skin is warm and dry. No rash noted. He is not diaphoretic. No erythema.  Psychiatric: He has a normal mood and affect. His behavior is normal. Thought content normal.  Nursing note and vitals reviewed.   ED Course  Procedures (including critical care time) Labs Review Labs Reviewed  ETHANOL - Abnormal; Notable for the following:    Alcohol, Ethyl (B) 254 (*)    All other components within normal limits  PROTIME-INR - Abnormal; Notable for the following:    Prothrombin Time 17.8 (*)    All other components within normal limits  CBC - Abnormal; Notable for the following:    RBC 4.01 (*)    Hemoglobin 12.6 (*)    HCT 37.0 (*)    RDW 19.6 (*)    Platelets 136 (*)    All other components within normal limits  DIFFERENTIAL - Abnormal; Notable for  the following:    Monocytes Relative 13 (*)    Monocytes Absolute 1.3 (*)    All other components within normal limits  COMPREHENSIVE METABOLIC PANEL - Abnormal; Notable for the following:    Sodium 130 (*)    Chloride 84 (*)    Glucose, Bld 105 (*)    Creatinine, Ser 1.67 (*)    Albumin 3.1 (*)    AST 345 (*)    ALT 162 (*)    Alkaline Phosphatase 258 (*)    Total Bilirubin 5.9 (*)    GFR calc non Af Amer 46 (*)    GFR calc Af Amer 54 (*)    Anion gap 24 (*)    All other components within normal limits  CBG MONITORING, ED - Abnormal; Notable for the following:    Glucose-Capillary 113 (*)    All other components within normal limits  I-STAT CHEM 8, ED - Abnormal; Notable for the following:    Sodium 130 (*)    Chloride 89 (*)    Creatinine, Ser 2.30 (*)    Glucose, Bld 110 (*)    Calcium, Ion 0.94 (*)    All other components within normal limits  APTT  URINE RAPID DRUG SCREEN (HOSP PERFORMED) NOT AT [facility]   URINALYSIS, ROUTINE W REFLEX MICROSCOPIC (NOT AT [facility])  TROPONIN I  I-STAT TROPOININ, ED    Imaging Review Ct Head Wo Contrast  03/09/2015   CLINICAL DATA:  49 year old male with a history of bilateral arm and leg weakness  EXAM: CT HEAD WITHOUT CONTRAST  TECHNIQUE: Contiguous axial images were obtained from the base of the skull through the vertex without intravenous contrast.  COMPARISON:  08/22/2014, 10/08/2014  FINDINGS: Unremarkable appearance of the calvarium without acute fracture or aggressive lesion.  Unremarkable appearance of the scalp soft tissues.  Unremarkable appearance of the bilateral orbits.  Mastoid air cells are clear.  No significant paranasal sinus disease  No acute intracranial hemorrhage, midline shift, or mass effect.  Gray-white differentiation is maintained, without CT evidence of acute ischemia.  Unremarkable configuration of the ventricles.  Similar degree of senescent brain volume loss compared to the prior CT  IMPRESSION: No CT evidence of acute intracranial abnormality.  Similar appearance of senescent brain volume loss, likely related to chronic microvascular ischemic disease.  Signed,  Dulcy Fanny. Earleen Newport, DO  [facility]  [facility]   Electronically Signed   By: Corrie Mckusick D.O.   On: 03/09/2015 07:07     EKG Interpretation   Date/Time:  Thursday March 09 2015 07:27:34 EDT Ventricular Rate:  97 PR Interval:  162 QRS Duration: 106 QT Interval:  371 QTC Calculation: 471 R Axis:   75 Text Interpretation:  Sinus rhythm Borderline low voltage, extremity leads  No significant change since last tracing Confirmed by Canary Brim  MD, MARTHA  [phone]) on 03/09/2015 7:34:43 AM       7:56 AM This provider spoke with Family Medicine, Dr. Kennith Maes - discussed case in great detail. Patient to be admitted.   MDM   Final diagnoses:  Elevated liver enzymes  Alcohol intoxication, with unspecified complication  Ataxia  Aphasia   AKI (acute kidney injury)    Medications  thiamine (B-1) injection 100 mg (not administered)  sodium chloride 0.9 % bolus 1,000 mL (not administered)  sodium chloride 0.9 % bolus 1,000 mL (1,000 mLs Intravenous New Bag/Given 03/09/15 0728)    Filed Vitals:   03/09/15 [id number] 03/09/15 0600 03/09/15 [id number]  03/09/15 0630  BP: 111/88 104/75 102/71 106/77  Pulse: 99 97 98 93  Temp: 98.5 F (36.9 C)     Resp: 17 14 25 18   Height: 5\' 10"  (1.778 m)     Weight: 250 lb (113.399 kg)     SpO2: 98% 99% 99% 97%   EKG noted normal sinus rhythm with a heart rate of 97 bpm,no significant change noted when compared to last tracing. I-STAT troponin 0.02. CBC negative elevated leukocytosis. Hemoglobin 12.6, hematocrit 37.0. CMP noted sodium of 1:30, chloride of 84. Elevated creatinine of 1.67. Liver enzymes-AST 345, ALT 162, alkaline phosphatase of 258, bilirubin of 5.9. Glucose of 105 with an elevated anion gap of 24. CBG 113. PT/INR within normal limits-mildly elevated PT of 17.8. Ethanol level 254. CT head no CT evidence of acute intracranial abnormalities. Similar appearance of sensitive brain volume loss likely related to chronic micromastia ischemic disease. Patient presenting to the ED with possible strokelike activity that occurred approximately 2-3 weeks ago, patient was not seen in the ED or by any medical professional during this time. Patient reports that he only drank 2 alcoholic beverages yesterday, denies continuous use of alcohol. Patient has smell of alcohol on breath in the ED setting today. Patient follows commands well, but does have ataxia with motion-possibly related to alcohol intoxication. Cannot rule out if patient had possible stroke versus TIA, will need to be further assessment. Patient to be admitted for further assessment. Cannot rule out possible neuro issue versus alcohol issue.   Jamse Mead, PA-C 03/09/15 Freeman Spur, MD 03/09/15 0900

## 2015-03-09 NOTE — ED Notes (Signed)
MD Hess notified that pt could not take PO meds due to failing the stroke screen, instructed to hold medications until he passes.

## 2015-03-09 NOTE — Progress Notes (Signed)
Patient noted coughing and with watery eyes while eating. MD Netti made aware and new NPO orders rec'd. Patient is not in and distress at this time. Will continue to monitor.

## 2015-03-09 NOTE — ED Notes (Signed)
MD at bedside; pt returned from [name] and back on monitor.

## 2015-03-09 NOTE — H&P (Signed)
[facility] Admission History and Physical Service Pager: [phone]  Patient name: JAVIS ABBOUD Medical record number: [id number] Date of birth: Nov 05, 1965 Age: 49 y.o. Gender: male  Primary Care Provider: Howard Pouch, DO Consultants: None Code Status: Full   Chief Complaint: ataxia, intoxication   Assessment and Plan: TRAVANTI MCMANUS is a 49 y.o. male presenting with ataxia and acute alcohol intoxication. PMH is significant for alcohol abuse, cardiac arrest, HTN, DM II.  #Acute Toxic Metabolic Encephalopathy - Probable acute alcohol intoxication but does have story of having possible TIA about 2 weeks ago.  No evidence of this on CT scan that was done upon admission.   - Admit to step down, CIWA protocol - Repeat CMET later PM  - Can consider further CVA w/u pending improvement in intoxication.  Would consider MRI prior to full CVA w/u if going to pursue this  - Consider B12/Folate due to chronic alcoholism and possible etiology of ongoing ataxia.   - Consider ammonia level  - Consult to CSW/CM   #AG Metabolic Acidosis - Most likely alcoholic ketoacidosis.   - Check UA and Lactic Acid for underlying cause - Hydrate and recheck BMET later   #Hyponatremia - Could be from a combination of beer potomania as well as volume depletion  - repeat CMET later today  - Replete with NS @ 125 cc after another 1 L NS bolus   #AKI - Most likely 2/2 to volume depletion vs ATN  - Repeat later today  - NS @ 125 cc - Limit nephrotoxic drugs   #Alcohol Abuse - Ethanol upon presentation was 39  - CIWA  - CSW for discussion   #Transaminitis with hyperbilirubinemia- Probable from alcoholic hepatitis vs viral hepatitis  - F/U CMET and trend LFT's as indicated - Consider hepatitis panel and direct/indirect bili  - F/U RUQ [name]   #DM II, poorly controlled - Last A1C 8.5 in January 2016. - Decrease home Lantus dose to 10 U  - Moderate SSI  - Adjust as needed  -  Repeat A1C   FEN/GI: NS bolus x 2 and then NS @ 125 cc/hr. Carb modified diet  Prophylaxis: SQ heparin   Disposition: SDU pending clearing of intoxication   History of Present Illness: GLYNN YEPES is a 49 y.o. male presenting with ataxia and dysarthria.  States he was with his friends for the past couple of days and "had a few" drinks but nothing substantial.  Complaining of ataxia and dysarthria but no blurred vision, facial droop, dysphagia, CP, SOB, lower extremity edema, hematemesis, melena, abdominal pain.  His ataxia and dysarthria started about two weeks ago per his report, but did not have any weakness or HA or encephalopathy.  Stated this got better over the previous 3-4 days up until this AM when he decided to come.  Does have DM II poorly controlled and thought his sugars may have been elevated.    In the ED, pt had multiple evaluations performed including CMET showing Na of 130, Cr 1.67 (baseline of 1-1.3), AST 345, ALT 162, Alk Phos 258, Bili 5.9, Ethanol of 254, EKG with NSR, INR 1.46, RUQ pending.  He was given one L NS bolus and CT head which showed no acute abnormality.    Denies smoking or IVDA.    Review Of Systems: Per HPI   Patient Active Problem List   Diagnosis Date Noted  . Abdominal pain 11/17/2014  . Acute blood loss anemia 11/17/2014  .  Hematemesis with nausea   . Alcohol abuse   . Hyperkalemia   . Tachycardia   . Hematemesis 11/16/2014  . Gastrointestinal hemorrhage with melena 10/10/2014  . Erosive esophagitis 10/10/2014  . Essential hypertension   . HLD (hyperlipidemia)   . Anemia due to other cause   . Hyperglycemia   . Syncope and collapse   . Dehydration 05/18/2014  . OSA on CPAP 12/28/2012  . Pulmonary edema 12/08/2012  . Hypoglycemia 12/07/2012  . Cardiac arrest 12/07/2012  . Obesity 12/07/2012  . Anoxic brain injury 12/07/2012  . GI bleed 11/26/2012  . Venous insufficiency 12/13/2011  . INSOMNIA, CHRONIC 02/14/2009  . 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 ALCOHOL ABUSE, HX OF 11/10/2007  . ERECTILE DYSFUNCTION 04/14/2007  . DM type 2 goal A1C below 7.5 12/04/2006  . HYPERCHOLESTEROLEMIA 12/04/2006  . OBESITY, NOS 12/04/2006  . ANXIETY 12/04/2006  . HYPERTENSION, BENIGN SYSTEMIC 12/04/2006  . ARTHRITIS 12/04/2006   Past Medical History: Past Medical History  Diagnosis Date  . Hypertension   . Blood transfusion 2014    "related to bleeding ulcer"  . Venous insufficiency 12/13/2011  . Cardiac arrest 12/07/2012  . Anxiety   . Acute renal insufficiency 12/08/2012  . High cholesterol   . OSA on CPAP 12/07/2012  . Type II diabetes mellitus   . Bleeding ulcer 2014  . GERD (gastroesophageal reflux disease)    Past Surgical History: Past Surgical History  Procedure Laterality Date  . Knee arthroscopy Left ~ 1999  . Esophagogastroduodenoscopy Left 11/26/2012    Procedure: ESOPHAGOGASTRODUODENOSCOPY (EGD);  Surgeon: Wonda Horner, MD;  Location: [facility] ENDOSCOPY;  Service: Endoscopy;  Laterality: Left;  . Cardiovascular stress test  03/16/13    Very Poor Exercise Tolerance; NON DIAGNOSTIC TEST  . Cataract extraction w/ intraocular lens implant Left 03/07/2014    [facility] @ [facility]  . Esophagogastroduodenoscopy N/A 10/10/2014    Procedure: ESOPHAGOGASTRODUODENOSCOPY (EGD);  Surgeon: Ladene Artist, MD;  Location: [facility] ENDOSCOPY;  Service: Endoscopy;  Laterality: N/A;   Social History: History  Substance Use Topics  . Smoking status: Never Smoker   . Smokeless tobacco: Current User    Types: Snuff  . Alcohol Use: 3.6 oz/week    6 Cans of beer per week    Family History: No family history on file. Allergies and Medications: Allergies  Allergen Reactions  . Lisinopril Other (See Comments)    Pt reports nose bleed.   No current facility-administered medications on file prior to encounter.   Current Outpatient Prescriptions on File Prior to Encounter  Medication Sig Dispense Refill  .  insulin glargine (LANTUS) 100 UNIT/ML injection Inject 0.2 mLs (20 Units total) into the skin daily. (Patient taking differently: Inject 10 Units into the skin at bedtime. ) 10 mL 11  . metoprolol succinate (TOPROL-XL) 100 MG 24 hr tablet TAKE ONE TABLET BY MOUTH DAILY. TAKE WITH OR IMMEDIATELY FOLLOWING A MEAL (Patient taking differently: at bedtime. TAKE ONE TABLET BY MOUTH DAILY. TAKE WITH OR IMMEDIATELY FOLLOWING A MEAL) 30 tablet 6  . omeprazole (PRILOSEC) 20 MG capsule Take 1 capsule (20 mg total) by mouth daily. (Patient taking differently: Take 20 mg by mouth at bedtime. ) 90 capsule 6  . pravastatin (PRAVACHOL) 80 MG tablet Take 1 tablet (80 mg total) by mouth daily. (Patient taking differently: Take 80 mg by mouth at bedtime. ) 30 tablet 6  . glucose blood test strip Use as instructed 100 each 12  .  LORazepam (ATIVAN) 1 MG tablet Take 60 tablets (60 mg total) by mouth every 6 (six) hours as needed for anxiety. 30 tablet 0    Objective: BP 106/77 mmHg  Pulse 93  Temp(Src) 98.5 F (36.9 C)  Resp 18  Ht $R'5\' 10"'sx$  (1.778 m)  Wt 250 lb (113.399 kg)  BMI 35.87 kg/m2  SpO2 97% Exam: General: Intoxicated, NAD  Eyes: No nystagmus or ophthalmoplegia, PERRLA  ENTM: Dry mucous membranes Neck: No JVD Cardiovascular: RRR, no murmurs appreciated  Respiratory: CTAB Abdomen: Soft/NT/ND, no organomegaly  MSK: Normal ROM  Skin: WWP  Neuro: CN 2-12 intact, slurred speech and ataxia  Psych: AAO x 3, intoxicated   Labs and Imaging: CBC BMET   Recent Labs Lab 03/09/15 0555 03/09/15 0619  WBC 9.9  --   HGB 12.6* 16.3  HCT 37.0* 48.0  PLT 136*  --     Recent Labs Lab 03/09/15 0555 03/09/15 0619  NA 130* 130*  K 3.9 3.8  CL 84* 89*  CO2 22  --   BUN 14 15  CREATININE 1.67* 2.30*  GLUCOSE 105* 110*  CALCIUM 8.9  --      EKG - NSR, prolonged QTc, J wave in II  CT Head:  IMPRESSION: No CT evidence of acute intracranial abnormality.  Similar appearance of senescent brain  volume loss, likely related to chronic microvascular ischemic disease.  Nolon Rod, DO 03/09/2015, 8:26 AM PGY-3, [place] [facility] pager: [phone], text pages welcome

## 2015-03-09 NOTE — ED Notes (Signed)
Dr. yelverton at bedside. 

## 2015-03-09 NOTE — Progress Notes (Signed)
Patient ate this evening and no swallowing difficulty noted.  No coughing episode exhibited.

## 2015-03-09 NOTE — ED Notes (Signed)
Marissa PA at bedside

## 2015-03-09 NOTE — ED Notes (Signed)
RN notified of critical lab (Lactic acid - 3.3) Attending MD paged.

## 2015-03-09 NOTE — ED Notes (Addendum)
Per [facility]: Pt has been having weakness, numbness, tingling for 2-3 days, in both arms, hands, and feet. Pt has been falling, and has been multiple times over the past 2-3 days. Pt currently having slurred speech and not oriented to time.   Pt may have had "2 40's last night" - per EMS, pt states "I just had some beers with a friend".

## 2015-03-10 ENCOUNTER — Encounter (HOSPITAL_COMMUNITY): Payer: Self-pay | Admitting: Family Medicine

## 2015-03-10 DIAGNOSIS — G931 Anoxic brain damage, not elsewhere classified: Secondary | ICD-10-CM

## 2015-03-10 DIAGNOSIS — R748 Abnormal levels of other serum enzymes: Secondary | ICD-10-CM | POA: Insufficient documentation

## 2015-03-10 DIAGNOSIS — E119 Type 2 diabetes mellitus without complications: Secondary | ICD-10-CM

## 2015-03-10 DIAGNOSIS — R4701 Aphasia: Secondary | ICD-10-CM | POA: Insufficient documentation

## 2015-03-10 LAB — HEPATITIS PANEL, ACUTE
HCV Ab: <0.1 s/co ratio — AB (ref 0.0–0.9)
HEP B C IGM: NEGATIVE — AB
Hep A IgM: NEGATIVE — AB
Hepatitis B Surface Ag: NEGATIVE — AB

## 2015-03-10 LAB — MRSA PCR SCREENING

## 2015-03-10 LAB — CBC
HEMATOCRIT: 33.4 % — AB (ref 39.0–52.0)
HEMOGLOBIN: 11.4 g/dL — AB (ref 13.0–17.0)
MCH: 31.3 pg (ref 26.0–34.0)
MCHC: 34.1 g/dL (ref 30.0–36.0)
MCV: 91.8 fL (ref 78.0–100.0)
Platelets: 116 10*3/uL — ABNORMAL LOW (ref 150–400)
RBC: 3.64 MIL/uL — ABNORMAL LOW (ref 4.22–5.81)
RDW: 20 % — AB (ref 11.5–15.5)
WBC: 6.8 10*3/uL (ref 4.0–10.5)

## 2015-03-10 LAB — COMPREHENSIVE METABOLIC PANEL
ALT: 142 U/L — AB (ref 17–63)
AST: 318 U/L — ABNORMAL HIGH (ref 15–41)
Albumin: 2.5 g/dL — ABNORMAL LOW (ref 3.5–5.0)
Alkaline Phosphatase: 223 U/L — ABNORMAL HIGH (ref 38–126)
Anion gap: 13 (ref 5–15)
BUN: 9 mg/dL (ref 6–20)
CALCIUM: 7.8 mg/dL — AB (ref 8.9–10.3)
CO2: 27 mmol/L (ref 22–32)
Chloride: 93 mmol/L — ABNORMAL LOW (ref 101–111)
Creatinine, Ser: 1.25 mg/dL — ABNORMAL HIGH (ref 0.61–1.24)
GFR calc Af Amer: >60 mL/min (ref >60)
Glucose, Bld: 94 mg/dL (ref 65–99)
Potassium: 3.7 mmol/L (ref 3.5–5.1)
Sodium: 133 mmol/L — ABNORMAL LOW (ref 135–145)
Total Bilirubin: 6.3 mg/dL — ABNORMAL HIGH (ref 0.3–1.2)
Total Protein: 6.7 g/dL (ref 6.5–8.1)

## 2015-03-10 LAB — GLUCOSE, CAPILLARY
GLUCOSE-CAPILLARY: 116 mg/dL — AB (ref 65–99)
GLUCOSE-CAPILLARY: 91 mg/dL (ref 65–99)
GLUCOSE-CAPILLARY: 102 mg/dL — AB (ref 65–99)
GLUCOSE-CAPILLARY: 117 mg/dL — AB (ref 65–99)
Glucose-Capillary: 107 mg/dL — ABNORMAL HIGH (ref 65–99)
Glucose-Capillary: 68 mg/dL (ref 65–99)
Glucose-Capillary: 76 mg/dL (ref 65–99)
Glucose-Capillary: 247 mg/dL — ABNORMAL HIGH (ref 65–99)

## 2015-03-10 LAB — AMMONIA: Ammonia: 50 umol/L — ABNORMAL HIGH (ref 9–35)

## 2015-03-10 MED ORDER — VITAMIN B-1 100 MG PO TABS
100.0000 mg | ORAL_TABLET | Freq: Every day | ORAL | Status: DC
  Administered 2015-03-11 – 2015-03-12 (×2): 100 mg via ORAL
  Filled 2015-03-10 (×2): qty 1

## 2015-03-10 MED ORDER — NAPHAZOLINE-PHENIRAMINE 0.025-0.3 % OP SOLN
1.0000 [drp] | Freq: Four times a day (QID) | OPHTHALMIC | Status: DC | PRN
  Administered 2015-03-10 – 2015-03-11 (×3): 1 [drp] via OPHTHALMIC
  Filled 2015-03-10 (×3): qty 5

## 2015-03-10 MED ORDER — MORPHINE SULFATE 2 MG/ML IJ SOLN
2.0000 mg | INTRAMUSCULAR | Status: DC | PRN
  Administered 2015-03-10 – 2015-03-11 (×5): 2 mg via INTRAVENOUS
  Filled 2015-03-10 (×5): qty 1

## 2015-03-10 MED ORDER — CHLORHEXIDINE GLUCONATE CLOTH 2 % EX PADS
6.0000 | MEDICATED_PAD | Freq: Every day | CUTANEOUS | Status: DC
  Administered 2015-03-10 – 2015-03-12 (×3): 6 via TOPICAL

## 2015-03-10 MED ORDER — DEXTROSE 50 % IV SOLN
INTRAVENOUS | Status: AC
  Administered 2015-03-10: 25 mL
  Filled 2015-03-10: qty 50

## 2015-03-10 MED ORDER — MUPIROCIN 2 % EX OINT
1.0000 "application " | TOPICAL_OINTMENT | Freq: Two times a day (BID) | CUTANEOUS | Status: DC
  Administered 2015-03-10 – 2015-03-11 (×5): 1 via NASAL
  Filled 2015-03-10: qty 22

## 2015-03-10 NOTE — Progress Notes (Signed)
CRITICAL VALUE ALERT  Critical value received: positive MRSA   Date of notification:  03/10/2015  Time of notification:  2018  Critical value read back: yes  Nurse who received alert: Eulis Foster RN  MD notified (1st page):  Dr Kirke Shaggy Time of first page:  [phone]  MD notified (2nd page):  Time of second page:  Responding MD:  Dr Kirke Shaggy  Time MD responded: [phone]

## 2015-03-10 NOTE — Progress Notes (Signed)
Family Medicine Teaching Service Daily Progress Note Intern Pager: [phone]  Patient name: Jeffrey [name] Medical record number: [id number] Date of birth: 01-12-1966 Age: 49 y.o. Gender: male  Primary Care Provider: Howard Pouch, DO Consultants: none Code Status: full  Pt Overview and Major Events to Date:  6/2 - admit to FPTS for ataxia and alcohol intoxication  Assessment and Plan: Jeffrey [name] is a 49 y.o. male presenting with ataxia and acute alcohol intoxication. PMH is significant for alcohol abuse, cardiac arrest, HTN, DM II.  #Acute Toxic Metabolic Encephalopathy - Improving. Probable acute alcohol intoxication but does have story of having possible TIA about 2 weeks ago. No evidence of this on CT scan that was done upon admission. Mentation has cleared with intoxication resolution.  - No indication for further CVA w/u at this time  - Consult to CSW/CM  - SLP consulted for aspiration risk  #AG Metabolic Acidosis - Resolved. Most likely alcoholic ketoacidosis.Lactic acid 3.3 > 2.5  #Hyponatremia - Stable. Could be from a combination of beer potomania as well as volume depletion  - Continue to monitor - Replete with NS @ 125 cc  #AKI - Improving. Cr 2.3 on admission -> 1.25 (at baseline). Most likely 2/2 to volume depletion - NS @ 125 cc - Limit nephrotoxic drugs   #Alcohol Abuse - Ethanol upon presentation was 254  - CIWA - scores 4, 5 O/N - CSW consulted   #Transaminitis with hyperbilirubinemia - Stable. Probable from alcoholic hepatitis. Hepatitis panel negative. - Continue to monitor - RUQ [name] - hepatomegaly, steatosis, no focal liver lesions  #DM II, poorly controlled - Last A1C 8.5 in January 2016. CBGs 68-116 in last 24 hours. - Decrease home Lantus dose to 10 U  - Moderate SSI - Required 0 units - Repeat A1C   FEN/GI:  NS @ 125 cc/hr. Reg diet  Prophylaxis: SQ heparin   Disposition: Currently in SDU, transfer to floor for continued  monitoring of detoxification.  Subjective:  Speech, thinking, and gait all improved.  Thinks he may stop drinking now.  Objective: Temp:  [97.9 F (36.6 C)-98.2 F (36.8 C)] 98.2 F (36.8 C) (06/03 0400) Pulse Rate:  [54-99] 71 (06/03 0600) Resp:  [13-21] 14 (06/03 0600) BP: (94-133)/(39-109) 107/63 mmHg (06/03 0600) SpO2:  [85 %-100 %] 99 % (06/03 0600) Weight:  [238 lb 8.6 oz (108.2 kg)] 238 lb 8.6 oz (108.2 kg) (06/02 1659) Physical Exam: General: Pleasant, NAD  Cardiovascular: RRR, no murmurs appreciated  Respiratory: CTAB, normal WOB, no w/r/c. Abdomen: Soft/NT/ND, no organomegaly  MSK: Normal ROM  Skin: WWP  Neuro: CN 2-12 intact, some slurred speech (patient reports this is baseline), strength and sensation intact, FNF intact.  Psych: AAO x 3  Laboratory:  Recent Labs Lab 03/09/15 0555 03/09/15 0619 03/09/15 1345 03/10/15 0450  WBC 9.9  --  7.6 6.8  HGB 12.6* 16.3 11.7* 11.4*  HCT 37.0* 48.0 34.2* 33.4*  PLT 136*  --  136* 116*    Recent Labs Lab 03/09/15 0555 03/09/15 0619 03/09/15 1345 03/10/15 0450  NA 130* 130* 134* 133*  K 3.9 3.8 3.5 3.7  CL 84* 89* 93* 93*  CO2 22  --  25 27  BUN 14 15 13 9   CREATININE 1.67* 2.30* 1.37* 1.25*  CALCIUM 8.9  --  8.2* 7.8*  PROT 7.9  --  6.4* 6.7  BILITOT 5.9*  --  5.0* 6.3*  ALKPHOS 258*  --  222* 223*  ALT 162*  --  143* 142*  AST 345*  --  310* 318*  GLUCOSE 105* 110* 90 94     Ammonia 48 > 50  INR 1.46  Imaging/Diagnostic Tests: EKG - NSR, prolonged QTc, J wave in II  CT Head: No CT evidence of acute intracranial abnormality.  Similar appearance of senescent brain volume loss, likely related to chronic microvascular ischemic disease.  RUQ [name]: Hepatomegaly with diffuse increase in liver echotexture. Suspect hepatic steatosis. There may be underlying parenchymal liver disease as well. While no focal liver lesions are identified, it must be cautioned that the sensitivity of ultrasound for focal  liver lesions is diminished markedly in this circumstance. Study otherwise unremarkable.  Virginia Crews, MD 03/10/2015, 7:33 AM PGY-1, [place] Intern pager: [phone], text pages welcome

## 2015-03-10 NOTE — Discharge Summary (Signed)
Jeffrey [name]  Patient name: Jeffrey [name] Medical record number: [id number] Date of birth: [birth year] Age: 49 y.o. Gender: male Date of Admission: 03/09/2015  Date of Discharge: 03/12/2015 Admitting Physician: Blane Ohara McDiarmid, MD  Primary Care Provider: Howard Pouch, DO Consultants: none  Indication for Hospitalization: ataxia, acute alcohol intoxication  Discharge Diagnoses/Problem List:  Acute toxic metabolic encephalopathy - resolved AG metabolic acidosis - resolved Hyponatremia likely 2/2 beer potomania AKI Alcohol abuse Transaminitis with hyperbilirubinemia T2DM Anxiety  Disposition: home with HHPT Discharge Condition: improved and stable Discharge Exam:  BP 120/74 mmHg  Pulse 83  Temp(Src) 98.3 F (36.8 C) (Oral)  Resp 16  Ht 5\' 10"  (1.778 m)  Wt 245 lb 9.5 oz (111.4 kg)  BMI 35.24 kg/m2  SpO2 100%  General: NAD, alert, awake Cardiovascular: RRR, no murmurs appreciated  Respiratory: CTAB, normal WOB, no w/r/c. Abdomen: Soft/NT/ND, no organomegaly  MSK: No erythema, no swelling, no bruising right UE. Mild TTP bicep tendon. No bone tenderness. Normal ROM abd/add UE. 5/5 MS. Neg empty can test, negative hawkins, negative obriens. + lift off test. NV intact distally.  Sits up in bed using full weight on right arm.  Skin: WWP  Neuro: CN 2-12 intact, some slurred speech (patient reports this is baseline), strength and sensation intact, FNF intact.  PsychModena Slater x 3  Brief Hospital Course:   [name] is a 49 y.o. male presenting with ataxia and acute alcohol intoxication. PMH is significant for alcohol abuse, cardiac arrest, HTN, DM II.  #Acute Toxic Metabolic Encephalopathy - Patient presenting with slurred speech and ataxia.  Thought to be most likely 2/2 acute alcohol intoxication, but patient reported possible TIA about 2 weeks prior to admission. CT head in ED showed no acute changes. With clearing of  intoxication, mentation cleared, neuro exam normal, and no further concern for CVA.  Patient with risk for aspiration and coughing while eating 6/2, so SLP was consulted.  After evaluation, patient was found to not be aspirating and allowed to eat regular diet. Eating 75% of meals while inpatient.   #AG Metabolic Acidosis - Anion gap of 24 on admission, related to alcoholic acidosis.  Lactic acid downtrended from 3.3 to 2.5, and anion gap closed on 6/3.  #Hyponatremia - Na 130 on admission. Likely related to combination of beer potomania as well as volume depletion. Was repleted with IV NS and monitored.  Na was 130 prior to discharge.   #AKI - Cr 2.3 on admission and quickly downtrended to 1.25 (at baseline) with IVF. Most likely 2/2 to volume depletion. Creatinine stable at baseline prior to discharge.  #Alcohol Abuse - Ethanol level upon presentation was 254.  Monitored by CIWA protocol. CSW consulted for substance abuse resources. Patient wanting to quit. Ativan, limited doses for ETOH withdraw sx only provided. Patient was able to quit on his own prior by this method. Encouraged him to contact [facility] and [employer] for assistance in ETOH abuse and anxiety. Patient felt paxil has been helpful to him in the past as well. Started low dose paxil on DC.   #Transaminitis with hyperbilirubinemia - AST 345, ALT 162, total bilirubin 5.9  Most likely from alcoholic hepatitis. Acute viral hepatitis panel negative.  RUQ US showed hepatomegaly, steatosis, but no focal liver lesions.  LFTs remained stable throughout admission.  #DM II, poorly controlled - A1c 8.8 on admission.  Managed with lower dose of Lantus during admission and SSI.  At discharge, Lantus  15 units. Encouraged to eat small frequent meals a day.   All other chronic medical conditions stable throughout hospitalization and managed with home regimens.  Issues for Follow Up:  - Patient reporting R shoulder pain intermittently (exam  benign) - possible biceps/nerve impingement - consider outpatient management. Tramadol provided since pt should avoid tylenol and NSAIDS with LFT and Cr.  - ETOH resources given: strongly encourage [facility] and/or  [facility].  - Diabetes and appetite - DC on 15 units lantus and encourage at least three small meals daily.  - CMET on follow up (LFT,Cr and Na) >> Consider cirrhosis work up/referral - Depression/anxiety: resources given, started on paxil  Significant Procedures: None  Significant Labs and Imaging:   Recent Labs Lab 03/09/15 0555 03/09/15 0619 03/09/15 1345 03/10/15 0450  WBC 9.9  --  7.6 6.8  HGB 12.6* 16.3 11.7* 11.4*  HCT 37.0* 48.0 34.2* 33.4*  PLT 136*  --  136* 116*    Recent Labs Lab 03/09/15 0555 03/09/15 0619 03/09/15 1345 03/10/15 0450 03/11/15 0436  NA 130* 130* 134* 133* 130*  K 3.9 3.8 3.5 3.7 3.9  CL 84* 89* 93* 93* 91*  CO2 22  --  25 27 25   GLUCOSE 105* 110* 90 94 197*  BUN 14 15 13 9 12   CREATININE 1.67* 2.30* 1.37* 1.25* 1.28*  CALCIUM 8.9  --  8.2* 7.8* 7.3*  ALKPHOS 258*  --  222* 223* 219*  AST 345*  --  310* 318* 218*  ALT 162*  --  143* 142* 128*  ALBUMIN 3.1*  --  2.7* 2.5* 2.3*   EKG - NSR, prolonged QTc, J wave in II  CT Head: No CT evidence of acute intracranial abnormality. Similar appearance of senescent brain volume loss, likely related to chronic microvascular ischemic disease.  RUQ [name]: Hepatomegaly with diffuse increase in liver echotexture. Suspect hepatic steatosis. There may be underlying parenchymal liver disease as well. While no focal liver lesions are identified, it must be cautioned that the sensitivity of ultrasound for focal liver lesions is diminished markedly in this circumstance. Study otherwise unremarkable.  Results/Tests Pending at Time of Discharge: None Discharge Medications:    Medication List    TAKE these medications        folic acid 1 MG tablet  Commonly known as:  FOLVITE   Take 1 tablet (1 mg total) by mouth daily.     insulin glargine 100 UNIT/ML injection  Commonly known as:  LANTUS  Inject 0.15 mLs (15 Units total) into the skin at bedtime.     LORazepam 1 MG tablet  Commonly known as:  ATIVAN  Take 1 tablet (1 mg total) by mouth every 8 (eight) hours as needed for anxiety.     metoprolol succinate 100 MG 24 hr tablet  Commonly known as:  TOPROL-XL  TAKE ONE TABLET BY MOUTH DAILY. TAKE WITH OR IMMEDIATELY FOLLOWING A MEAL     omeprazole 20 MG capsule  Commonly known as:  PRILOSEC  Take 1 capsule (20 mg total) by mouth 2 (two) times daily before a meal.     PARoxetine 20 MG tablet  Commonly known as:  PAXIL  Take 1 tablet (20 mg total) by mouth daily.     traMADol 50 MG tablet  Commonly known as:  ULTRAM  Take 1 tablet (50 mg total) by mouth every 12 (twelve) hours as needed for severe pain.        Discharge Instructions: Please  refer to Patient Instructions section of EMR for full details.  Patient was counseled important signs and symptoms that should prompt return to medical care, changes in medications, dietary instructions, activity restrictions, and follow up appointments.   Follow-Up Appointments: Follow-up Information    Follow up with [facility], DO. Schedule an appointment as soon as possible for a visit in 1 day.   Specialty:  Family Medicine   Why:  make an appointment for the next 1-2 weeks,    Contact information:   [place] [address] [phone]       Ma Hillock, DO 03/12/2015, 11:15 AM PGY-3, [place]

## 2015-03-10 NOTE — Evaluation (Signed)
Occupational Therapy Evaluation and Discharge Patient Details Name: Jeffrey [name] MRN: [id number] DOB: November 30, 1965 Today's Date: 03/10/2015    History of Present Illness Jeffrey [name] is a 49 y.o. male presenting with ataxia and acute alcohol intoxication. PMH is significant for alcohol abuse, cardiac arrest, HTN, DM II. h/o falls   Clinical Impression   This 49 yo male admitted with above presents to acute OT with decreased balance and agitation when this is pointed out or suggested that he should use an AD. He states that he will be fine when he leaves here, that his balance is only off because he has been in the bed for 1 1/2 days (however he reports that he has fallen at least 5 times at home). He has mostly 24/7 S/prn A at home per his report. No further OT needs, we will sign off.    Follow Up Recommendations  No OT follow up    Equipment Recommendations  None recommended by OT       Precautions / Restrictions Precautions Precautions: Fall Restrictions Weight Bearing Restrictions: No      Mobility Bed Mobility Overal bed mobility: Independent                Transfers Overall transfer level: Needs assistance Equipment used: 1 person hand held assist Transfers: Sit to/from Stand Sit to Stand: Min guard         General transfer comment: min A for ambulation as well without AD--with 3 LOB while ambulating    Balance Overall balance assessment: Needs assistance Sitting-balance support: No upper extremity supported;Feet supported Sitting balance-Leahy Scale: Good     Standing balance support: No upper extremity supported Standing balance-Leahy Scale: Fair                              ADL Overall ADL's : Needs assistance/impaired                                       General ADL Comments: overall at S level with min guard A while up on his feet if he is not using an AD due to 3 LOB while up on his feet with [name] this  session. Pt not open to using and AD at this time, says he will be fine when he gets out of here.      Vision Additional Comments: No change per pt's report. He did at times not seem to regard items on his right; however upon furhter testing his vision checked out fine. More than likely just decreased attention to his environment from his TBI          Pertinent Vitals/Pain Pain Assessment: Faces Faces Pain Scale: Hurts even more Pain Location: eyes Pain Descriptors / Indicators: Aching;Sore Pain Intervention(s):  (pt requesting his eye drops)     Hand Dominance Right   Extremity/Trunk Assessment Upper Extremity Assessment Upper Extremity Assessment: Overall WFL for tasks assessed   Lower Extremity Assessment Lower Extremity Assessment: Defer to PT evaluation       Communication Communication Communication: No difficulties   Cognition Arousal/Alertness: Awake/alert Behavior During Therapy: Agitated Overall Cognitive Status: 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)  Home Living Family/patient expects to be discharged to:: Private residence Living Arrangements: Spouse/significant other Available Help at Discharge: Family (available mostly 24/7 per pt) Type of Home: House Home Access: [place]: One level     Bathroom Shower/Tub: Teacher, early years/pre: [place]: Environmental consultant - 2 wheels;Cane - single point   Additional Comments: Pt states he has DME but he does not use it that he will not need it when he leaves here, he is only off balance for being the bed 1 1/2 days      Prior Functioning/Environment Level of Independence: Independent        Comments: denies working at this time ; independent with ADLs    OT Diagnosis: Generalized weakness;Cognitive deficits         OT Goals(Current goals can be found in the care plan section) Acute Rehab OT  Goals Patient Stated Goal: to go home  OT Frequency:             Co-evaluation PT/OT/SLP Co-Evaluation/Treatment: Yes Reason for Co-Treatment: For patient/therapist safety   OT goals addressed during session: ADL's and self-care;Strengthening/ROM      End of Session Equipment Utilized During Treatment: Gait belt  Activity Tolerance: Patient tolerated treatment well (agitiated with pointing out deficits and asking multiple questions) Patient left: in chair;with call bell/phone within reach;with chair alarm set   Time: 1131-1159 OT Time Calculation (min): 28 min Charges:  OT General Charges $OT Visit: 1 Procedure OT Evaluation $Initial OT Evaluation Tier I: 1 Procedure  Almon Register [id number] 03/10/2015, 12:49 PM

## 2015-03-10 NOTE — Progress Notes (Addendum)
Nutrition Brief Note  Patient identified on the Malnutrition Screening Tool (MST) Report.  Per readings below, patient has had a 5% weight loss since March 2016; not significant for time frame.  Wt Readings from Last 15 Encounters:  03/09/15 238 lb 8.6 oz (108.2 kg)  12/16/14 250 lb 1.6 oz (113.445 kg)  11/16/14 260 lb (117.935 kg)  10/11/14 248 lb 14.4 oz (112.9 kg)  08/30/14 258 lb (117.028 kg)  08/21/14 237 lb 14 oz (107.9 kg)  05/19/14 264 lb 15.9 oz (120.2 kg)  01/14/14 264 lb 11.2 oz (120.067 kg)  12/29/13 268 lb 1.6 oz (121.609 kg)  08/06/13 280 lb 6.4 oz (127.189 kg)  06/04/13 207 lb 4.8 oz (94.031 kg)  05/31/13 268 lb 15.4 oz (122 kg)  05/26/13 274 lb (124.286 kg)  04/07/13 273 lb (123.832 kg)  03/11/13 272 lb (123.378 kg)    Body mass index is 34.23 kg/(m^2). Patient meets criteria for Obesity Class I based on current BMI.   Current diet order is Carbohydrate Modified. Labs and medications reviewed.   No nutrition interventions warranted at this time. If nutrition issues arise, please consult RD.   Arthur Holms, RD, LDN Pager #: [phone] After-Hours Pager #: [phone]

## 2015-03-10 NOTE — Progress Notes (Signed)
Utilization Review Completed.  

## 2015-03-10 NOTE — Evaluation (Addendum)
Clinical/Bedside Swallow Evaluation Patient Details  Name: Jeffrey [name] MRN: [id number] Date of Birth: 1965-10-21  Today's Date: 03/10/2015 Time: SLP Start Time (ACUTE ONLY): 72 SLP Stop Time (ACUTE ONLY): 0932 SLP Time Calculation (min) (ACUTE ONLY): 12 min  Past Medical History:  Past Medical History  Diagnosis Date  . Hypertension   . Blood transfusion 2014    "related to bleeding ulcer"  . Venous insufficiency 12/13/2011  . Cardiac arrest 12/07/2012    Anoxic encephalopathy  . Anxiety   . Acute renal insufficiency 12/08/2012  . High cholesterol   . OSA on CPAP 12/07/2012  . Type II diabetes mellitus   . Bleeding ulcer 2014  . GERD (gastroesophageal reflux disease)   . Anoxic encephalopathy    Past Surgical History:  Past Surgical History  Procedure Laterality Date  . Knee arthroscopy Left ~ 1999  . Esophagogastroduodenoscopy Left 11/26/2012    Procedure: ESOPHAGOGASTRODUODENOSCOPY (EGD);  Surgeon: Wonda Horner, MD;  Location: [facility] ENDOSCOPY;  Service: Endoscopy;  Laterality: Left;  . Cardiovascular stress test  03/16/13    Very Poor Exercise Tolerance; NON DIAGNOSTIC TEST  . Cataract extraction w/ intraocular lens implant Left 03/07/2014    [place] @ [facility]  . Esophagogastroduodenoscopy N/A 10/10/2014    Procedure: ESOPHAGOGASTRODUODENOSCOPY (EGD);  Surgeon: Ladene Artist, MD;  Location: [facility] ENDOSCOPY;  Service: Endoscopy;  Laterality: N/A;   HPI:  Jeffrey [name] is a 49 y.o. with PMH: HTN, DM, OSA, GERD, cardiac arrest, ETOH abuse, acute renal insufficiency admitted with ataxia and acute alcohol intoxication. PMH is significant for alcohol abuse, cardiac arrest, HTN, DM II. CT No CT evidence of acute intracranial abnormality.  Pt had prior BSE's in Jan 2016 without indications oropharyngeal dysphagia and regular, thin recommended.  Assessment / Plan / Recommendation Clinical Impression  Bedside swallow assessment obtained due to pt coughing last night with po's  per RN. No indications of an oropharyngeal dysphagia. Pt with history of GERD and affirms globus sensation intermittently. May have irritated esophagus due to ETOH abuse. Educated pt on reflux precautions. Pt may have fatigued from intoxication and unable to protect airway fully yesterday. Recommend regular texture and thin liquids, pills with thin, reflux precautions. No further ST needed at this time.    Aspiration Risk   (mild-mod)    Diet Recommendation Thin (reuglar)   Medication Administration: Whole meds with liquid Compensations: Small sips/bites;Slow rate    Other  Recommendations Oral Care Recommendations: Oral care BID   Follow Up Recommendations       Frequency and Duration        Pertinent Vitals/Pain none      Swallow Study          Oral/Motor/Sensory Function Overall Oral Motor/Sensory Function: Appears within functional limits for tasks assessed   Ice Chips Ice chips: Not tested   Thin Liquid Thin Liquid: Within functional limits Presentation: Cup;Straw    Nectar Thick Nectar Thick Liquid: Not tested   Honey Thick Honey Thick Liquid: Not tested   Puree Puree: Within functional limits   Solid   GO    Solid: Within functional limits       Houston Siren 03/10/2015,9:59 AM   Orbie Pyo Colvin Caroli.Ed [employer] [phone]

## 2015-03-10 NOTE — Evaluation (Signed)
Physical Therapy Evaluation Patient Details Name: Jeffrey [name] MRN: [id number] DOB: [birth year] Today's Date: 03/10/2015   History of Present Illness  Jeffrey [name] is a 49 y.o. male presenting with ataxia and acute alcohol intoxication. PMH is significant for alcohol abuse, cardiac arrest, HTN, DM II. h/o falls  Clinical Impression  Pt agitated on arrival with eyes hurting, discussed with pt his reason for admission and situation and pt agitated with this as well stating he fell out of bed and thought he was having a stroke and had trouble speaking. Pt reports at least 5 recent falls but denies having difficulty with his gait or balance. Attempted to educate pt for his balance and gait deficits placing him at a mod fall risk but pt not very receptive and stating " I will be fine when I get out of here". Pt educated to use RW and have assist with mobility acutely due to balance deficits. Pt with decreased gait, balance and safety awareness who will benefit from acute therapy to maximize mobility, balance and function and decrease caregiver burden.     Follow Up Recommendations Home health PT    Equipment Recommendations  None recommended by PT    Recommendations for Other Services       Precautions / Restrictions Precautions Precautions: Fall Restrictions Weight Bearing Restrictions: No      Mobility  Bed Mobility Overal bed mobility: Independent                Transfers Overall transfer level: Needs assistance Equipment used: 1 person hand held assist Transfers: Sit to/from Stand Sit to Stand: Min guard         General transfer comment: guarding for safety due to lines, cognition and impaired balance  Ambulation/Gait Ambulation/Gait assistance: Min assist Ambulation Distance (Feet): 400 Feet Assistive device: None Gait Pattern/deviations: Step-through pattern;Wide base of support;Decreased stride length   Gait velocity interpretation: Below normal speed for  age/gender General Gait Details: pt with wide based gait with 3 LOb with gait with assist to recover  Stairs            Wheelchair Mobility    Modified Rankin (Stroke Patients Only)       Balance Overall balance assessment: Needs assistance Sitting-balance support: No upper extremity supported;Feet supported Sitting balance-Leahy Scale: Good     Standing balance support: No upper extremity supported Standing balance-Leahy Scale: Fair                               Pertinent Vitals/Pain Pain Assessment: Faces Pain Score: 5  Faces Pain Scale: Hurts even more Pain Location: eyes Pain Descriptors / Indicators: Aching Pain Intervention(s): Patient requesting pain meds-RN notified  HR 89 sats 100% on RA    Home Living Family/patient expects to be discharged to:: Private residence Living Arrangements: Spouse/significant other Available Help at Discharge: Family (available mostly 24/7 per pt) Type of Home: House Home Access: Ramped entrance     Home Layout: One level Home Equipment: Walker - 2 wheels;Cane - single point Additional Comments: Pt states he has DME but he does not use it that he will not need it when he leaves here, he is only off balance for being the bed 1 1/2 days    Prior Function Level of Independence: Independent         Comments: denies working at this time ; independent with ADLs     Hand Dominance  Dominant Hand: Right    Extremity/Trunk Assessment   Upper Extremity Assessment: Defer to OT evaluation           Lower Extremity Assessment: Overall WFL for tasks assessed      Cervical / Trunk Assessment: Normal  Communication   Communication: No difficulties  Cognition Arousal/Alertness: Awake/alert Behavior During Therapy: Agitated Overall Cognitive Status: No family/caregiver present to determine baseline cognitive functioning                      General Comments      Exercises         Assessment/Plan    PT Assessment Patient needs continued PT services  PT Diagnosis Difficulty walking;Altered mental status   PT Problem List Decreased activity tolerance;Decreased balance;Decreased mobility;Decreased knowledge of use of DME;Decreased cognition  PT Treatment Interventions Gait training;DME instruction;Functional mobility training;Balance training;Patient/family education   PT Goals (Current goals can be found in the Care Plan section) Acute Rehab PT Goals Patient Stated Goal: return home PT Goal Formulation: With patient Time For Goal Achievement: 03/24/15 Potential to Achieve Goals: Good    Frequency Min 3X/week   Barriers to discharge        Co-evaluation PT/OT/SLP Co-Evaluation/Treatment: Yes Reason for Co-Treatment: For patient/therapist safety PT goals addressed during session: Balance;Mobility/safety with mobility OT goals addressed during session: ADL's and self-care;Strengthening/ROM       End of Session Equipment Utilized During Treatment: Gait belt Activity Tolerance: Treatment limited secondary to agitation Patient left: in chair;with call bell/phone within reach;with chair alarm set Nurse Communication: Mobility status;Precautions         Time: [id number] PT Time Calculation (min) (ACUTE ONLY): 21 min   Charges:   PT Evaluation $Initial PT Evaluation Tier I: 1 Procedure     PT G CodesMelford Aase 03/10/2015, 1:40 PM  Elwyn Reach, [place]

## 2015-03-10 NOTE — Care Management Note (Signed)
Case Management Note  Patient Details  Name: Jeffrey [name] MRN: [id number] Date of Birth: Nov 25, 1965  Subjective/Objective:      Pt lives with spouse, states he is able to purchase medications with his orange card.  States he is applying for disability and medicaid, spouse is also applying for disability and will be available 24/7 when pt is discharged.              Action/Plan: Referral to financial counselor.  Expected Discharge Date:                  Expected Discharge Plan:  Home/Self Care  In-House Referral:  Financial Counselor  Discharge planning Services     Post Acute Care Choice:    Choice offered to:     DME Arranged:    DME Agency:     HH Arranged:    HH Agency:     Status of Service:  In process, will continue to follow  Medicare Important Message Given:    Date Medicare IM Given:    Medicare IM give by:    Date Additional Medicare IM Given:    Additional Medicare Important Message give by:     If discussed at [place] of Stay Meetings, dates discussed:    Additional Comments:  Girard Cooter, RN 03/10/2015, 1:19 PM

## 2015-03-11 LAB — GLUCOSE, CAPILLARY
GLUCOSE-CAPILLARY: 169 mg/dL — AB (ref 65–99)
GLUCOSE-CAPILLARY: 186 mg/dL — AB (ref 65–99)
GLUCOSE-CAPILLARY: 240 mg/dL — AB (ref 65–99)
Glucose-Capillary: 173 mg/dL — ABNORMAL HIGH (ref 65–99)
Glucose-Capillary: 234 mg/dL — ABNORMAL HIGH (ref 65–99)
Glucose-Capillary: 275 mg/dL — ABNORMAL HIGH (ref 65–99)

## 2015-03-11 LAB — COMPREHENSIVE METABOLIC PANEL
ALT: 128 U/L — AB (ref 17–63)
ANION GAP: 14 (ref 5–15)
AST: 218 U/L — ABNORMAL HIGH (ref 15–41)
Albumin: 2.3 g/dL — ABNORMAL LOW (ref 3.5–5.0)
Alkaline Phosphatase: 219 U/L — ABNORMAL HIGH (ref 38–126)
BILIRUBIN TOTAL: 6.4 mg/dL — AB (ref 0.3–1.2)
BUN: 12 mg/dL (ref 6–20)
CO2: 25 mmol/L (ref 22–32)
Calcium: 7.3 mg/dL — ABNORMAL LOW (ref 8.9–10.3)
Chloride: 91 mmol/L — ABNORMAL LOW (ref 101–111)
Creatinine, Ser: 1.28 mg/dL — ABNORMAL HIGH (ref 0.61–1.24)
GFR calc Af Amer: >60 mL/min (ref >60)
Glucose, Bld: 197 mg/dL — ABNORMAL HIGH (ref 65–99)
Potassium: 3.9 mmol/L (ref 3.5–5.1)
Sodium: 130 mmol/L — ABNORMAL LOW (ref 135–145)
TOTAL PROTEIN: 6 g/dL — AB (ref 6.5–8.1)

## 2015-03-11 LAB — HEMOGLOBIN A1C
Hgb A1c MFr Bld: 8.8 % — ABNORMAL HIGH (ref 4.8–5.6)
MEAN PLASMA GLUCOSE: 206 mg/dL

## 2015-03-11 NOTE — Progress Notes (Signed)
Family Medicine Teaching Service Daily Progress Note Intern Pager: [phone]  Patient name: Jeffrey [name] Medical record number: [id number] Date of birth: 1966/01/03 Age: 49 y.o. Gender: male  Primary Care Provider: Howard Pouch, DO Consultants: none Code Status: full  Pt Overview and Major Events to Date:  6/2 - admit to [facility] for ataxia and alcohol intoxication 6/4: PT HH recommended.   Assessment and Plan: Jeffrey [name] is a 49 y.o. male presenting with ataxia and acute alcohol intoxication. PMH is significant for alcohol abuse, cardiac arrest, HTN, DM II.  Acute Toxic Metabolic Encephalopathy - Improving. Probable acute alcohol intoxication but does have story of having possible TIA about 2 weeks ago. No evidence of this on CT scan that was done upon admission. Mentation has cleared with intoxication resolution.  - No indication for further CVA w/u at this time  - SLP consulted for aspiration risk >> mild to moderate risk>> thin regular diet with small sips/bites, slow rate - Uncertain if numbness/tinging in his extremities is neuropathy pain, it is more chronic by charts. Could consider low dose gabapentin.   AG Metabolic Acidosis - Resolved. Most likely alcoholic ketoacidosis.Lactic acid 3.3 > 2.5  Hyponatremia - 133>130 today. Could be from a combination of beer potomania as well as volume depletion. Had been repleted with NaCl. Currently not on IVF. Regular diet.  - Continue to monitor  AKI - Improving. Cr 2.3 on admission -> 1.25>> 1.28 (at baseline). Most likely 2/2 to volume depletion - Limit nephrotoxic drugs   Alcohol Abuse - Ethanol upon presentation was 254  - CIWA - scores 2 overnight/morning >> tactile disturbances.  - CSW consulted   transaminitis with hyperbilirubinemia - Improving daily. Probable from alcoholic hepatitis. Hepatitis panel negative. - Continue to monitor - INR elevated on admission, VitK given x3, re-check INR 6/5, order placed.  Likely from ETOH liver disease, may be irreversible.  - RUQ [name] - hepatomegaly, steatosis, no focal liver lesions - Needs CSW seen ASAP for resources.   DM II, poorly controlled - Last A1C 8.5 in January 2016. CBGs 94-197 in last 24 hours. ? Hypoglycemia 2/2 to poor PO intake? - Decrease home Lantus dose to 10 U (Home dose 20U qhs pt states only taking 10u at home) - Moderate SSI (required 3U this morning only) - A1c >> 8.8  Right shoulder pain: Can be followed up outpt. Normal ROM on exam, pt sleeping on his right shoulder. Pain seems intermittent, sharp ~10 times a day, consider referred pain as etiology as well with diaphragmatic irritation from liver.  FEN/GI:  Reg diet, Monitor percentile of meal completion  Prophylaxis: SQ heparin   Disposition: detoxification, discharge pending clinical improvement. HHPT recommended. CM assisting with medications. CSW consulted for resources on ETOH abuse.  Subjective:  Pt with multiple complaints today. Still continues to complain of numbness/tingling in bilateral hands and feet, which he reports started a few weeks ago. Right intermittent stabbing shoulder pain. Decreased appetite that has been ongoing for weeks, he states and bilateral burning of eyes that started 2 days ago and he reports the eye drops are helping.   Objective: Temp:  [97.3 F (36.3 C)-98.5 F (36.9 C)] 98 F (36.7 C) (06/04 0623) Pulse Rate:  [70-89] 75 (06/04 0623) Resp:  [14-18] 18 (06/04 0623) BP: (100-115)/(55-79) 107/77 mmHg (06/04 0623) SpO2:  [98 %-100 %] 99 % (06/04 0623) Weight:  [245 lb 9.5 oz (111.4 kg)] 245 lb 9.5 oz (111.4 kg) (06/03 2046)   Physical Exam:  General: NAD, sleeping on right shoulder, awakes to name, talkative.  Cardiovascular: RRR, no murmurs appreciated  Respiratory: CTAB, normal WOB, no w/r/c. Abdomen: Soft/NT/ND, no organomegaly  MSK: Normal ROM  Skin: WWP  Neuro: CN 2-12 intact, some slurred speech (patient reports this is  baseline), strength and sensation intact, FNF intact.  Psych: AAO x 3  Laboratory:  Recent Labs Lab 03/09/15 0555 03/09/15 0619 03/09/15 1345 03/10/15 0450  WBC 9.9  --  7.6 6.8  HGB 12.6* 16.3 11.7* 11.4*  HCT 37.0* 48.0 34.2* 33.4*  PLT 136*  --  136* 116*    Recent Labs Lab 03/09/15 1345 03/10/15 0450 03/11/15 0436  NA 134* 133* 130*  K 3.5 3.7 3.9  CL 93* 93* 91*  CO2 25 27 25   BUN 13 9 12   CREATININE 1.37* 1.25* 1.28*  CALCIUM 8.2* 7.8* 7.3*  PROT 6.4* 6.7 6.0*  BILITOT 5.0* 6.3* 6.4*  ALKPHOS 222* 223* 219*  ALT 143* 142* 128*  AST 310* 318* 218*  GLUCOSE 90 94 197*   A1c: 8.8 Ammonia 48 > 50 INR 1.46  Imaging/Diagnostic Tests: EKG - NSR, prolonged QTc, J wave in II  CT Head: No CT evidence of acute intracranial abnormality.  Similar appearance of senescent brain volume loss, likely related to chronic microvascular ischemic disease.  RUQ [name]: Hepatomegaly with diffuse increase in liver echotexture. Suspect hepatic steatosis. There may be underlying parenchymal liver disease as well. While no focal liver lesions are identified, it must be cautioned that the sensitivity of ultrasound for focal liver lesions is diminished markedly in this circumstance. Study otherwise unremarkable.  Ma Hillock, DO 03/11/2015, 8:48 AM PGY-3, [place] Intern pager: [phone], text pages welcome

## 2015-03-12 LAB — GLUCOSE, CAPILLARY
GLUCOSE-CAPILLARY: 220 mg/dL — AB (ref 65–99)
Glucose-Capillary: 208 mg/dL — ABNORMAL HIGH (ref 65–99)
Glucose-Capillary: 183 mg/dL — ABNORMAL HIGH (ref 65–99)
Glucose-Capillary: 183 mg/dL — ABNORMAL HIGH (ref 65–99)

## 2015-03-12 LAB — PROTIME-INR
INR: 1.66 — ABNORMAL HIGH (ref 0.00–1.49)
PROTHROMBIN TIME: 19.6 s — AB (ref 11.6–15.2)

## 2015-03-12 MED ORDER — LORAZEPAM 1 MG PO TABS
1.0000 mg | ORAL_TABLET | Freq: Three times a day (TID) | ORAL | Status: DC | PRN
Start: 2015-03-12 — End: 2015-03-12

## 2015-03-12 MED ORDER — TRAMADOL HCL 50 MG PO TABS: 50.0000 mg | ORAL_TABLET | Freq: Two times a day (BID) | ORAL | Status: DC | PRN

## 2015-03-12 MED ORDER — ACETAMINOPHEN 325 MG PO TABS
650.0000 mg | ORAL_TABLET | Freq: Four times a day (QID) | ORAL | Status: DC | PRN
  Administered 2015-03-12: 650 mg via ORAL
  Filled 2015-03-12: qty 2

## 2015-03-12 MED ORDER — OMEPRAZOLE 20 MG PO CPDR: 20.0000 mg | DELAYED_RELEASE_CAPSULE | Freq: Two times a day (BID) | ORAL | Status: DC

## 2015-03-12 MED ORDER — TRAMADOL HCL 50 MG PO TABS
50.0000 mg | ORAL_TABLET | Freq: Once | ORAL | Status: AC
  Administered 2015-03-12: 50 mg via ORAL
  Filled 2015-03-12: qty 1

## 2015-03-12 MED ORDER — INSULIN GLARGINE 100 UNIT/ML ~~LOC~~ SOLN: 15.0000 [IU] | Freq: Every day | SUBCUTANEOUS | Status: DC

## 2015-03-12 MED ORDER — INSULIN GLARGINE 100 UNIT/ML ~~LOC~~ SOLN
15.0000 [IU] | Freq: Every day | SUBCUTANEOUS | Status: DC
  Filled 2015-03-12: qty 0.15

## 2015-03-12 MED ORDER — PAROXETINE HCL 20 MG PO TABS: 20.0000 mg | ORAL_TABLET | Freq: Every day | ORAL | Status: DC

## 2015-03-12 MED ORDER — LORAZEPAM 1 MG PO TABS: 1.0000 mg | ORAL_TABLET | Freq: Three times a day (TID) | ORAL | Status: DC | PRN

## 2015-03-12 MED ORDER — FOLIC ACID 1 MG PO TABS: 1.0000 mg | ORAL_TABLET | Freq: Every day | ORAL | Status: DC

## 2015-03-12 MED ORDER — FOLIC ACID 1 MG PO TABS
1.0000 mg | ORAL_TABLET | Freq: Every day | ORAL | Status: DC
Start: 2015-03-12 — End: 2016-05-27

## 2015-03-12 NOTE — Progress Notes (Addendum)
Patient complains of pain in arm/shoulder 9/10. Patient asking about xray and pain medication. VSS. Paged on-call provider to discuss. Will continue to monitor.

## 2015-03-12 NOTE — Discharge Instructions (Signed)
[facility]  [phone]  [place]  [phone]  Make sure you pay attention to the changes in doses of your medications

## 2015-03-12 NOTE — Progress Notes (Signed)
Family Medicine Teaching Service Daily Progress Note Intern Pager: [phone]  Patient name: Jeffrey [name] Medical record number: [id number] Date of birth: Jul 26, 1966 Age: 49 y.o. Gender: male  Primary Care Provider: Howard Pouch, DO Consultants: none Code Status: full  Pt Overview and Major Events to Date:  6/2 - admit to [facility] for ataxia and alcohol intoxication 6/4: PT HH recommended.   Assessment and Plan: Jeffrey [name] is a 49 y.o. male presenting with ataxia and acute alcohol intoxication. PMH is significant for alcohol abuse, cardiac arrest, HTN, DM II.  Acute Toxic Metabolic Encephalopathy - Improving. Probable acute alcohol intoxication but does have story of having possible TIA about 2 weeks ago. No evidence of this on CT scan that was done upon admission. Mentation has cleared with intoxication resolution.  - No indication for further CVA w/u at this time  - SLP consulted for aspiration risk >> mild to moderate risk>> thin regular diet with small sips/bites, slow rate - Uncertain if numbness/tinging in his extremities is neuropathy pain, it is more chronic by charts. Could consider low dose gabapentin.   AG Metabolic Acidosis - Resolved. Most likely alcoholic ketoacidosis.Lactic acid 3.3 > 2.5  Hyponatremia - 133>130 today. Could be from a combination of beer potomania as well as volume depletion. Had been repleted with NaCl. Currently not on IVF. Regular diet.  - Continue to monitor  AKI - Improving. Cr 2.3 on admission -> 1.25>> 1.28 (at baseline). Most likely 2/2 to volume depletion - Limit nephrotoxic drugs   Alcohol Abuse - Ethanol upon presentation was 254  - CIWA - scores 0 overnight/morning   - CSW  Provided resources  transaminitis with hyperbilirubinemia - Improving daily. Probable from alcoholic hepatitis. Hepatitis panel negative. - Continue to monitor - INR elevated on admission, VitK given x3, re-check INR 6/5, order placed. Likely from ETOH  liver disease, may be irreversible.  - RUQ [name] - hepatomegaly, steatosis, no focal liver lesions - Needs CSW seen ASAP for resources >> contacted them today by phone, they will try to see him later today  DM II, poorly controlled - Last A1C 8.5 in January 2016. CBGs 183-240 in last 24 hours.  - Patient complained of poor PO intake. Nursing recorded 75% of all meals completed yesterday. - Lantus 10 U >> will increase to 15 units tonight if inpatient (20 units at home but he was only taking 10) - Moderate SSI >>12 units total 24 hours - A1c >> 8.8   Right shoulder pain: Patient continues to complain of increasing pain in right shoulder, needing tramadol overnight for pain.  - Likely nerve impingment with exam. Can f/u out pt, tramadol for severe pain only. Need to avoid tylenol with elevated LFT, and NSAIDS with elevated Creatinine.   FEN/GI:  Reg diet, Monitor percentile of meal completion  Prophylaxis: SQ heparin   Disposition: detoxification, discharge today with  HHPT. CM assisting with medications.   Subjective:  Long discussion on ETOH menagement today and the effects it is having on his body. Patient is ready for DC today. CSW has provided counseling and community resources.   Objective: Temp:  [98.1 F (36.7 C)-98.5 F (36.9 C)] 98.3 F (36.8 C) (06/05 0533) Pulse Rate:  [66-83] 83 (06/05 0531) Resp:  [16-17] 16 (06/05 0533) BP: (87-128)/(49-80) 120/74 mmHg (06/05 0533) SpO2:  [98 %-100 %] 100 % (06/05 0531)   Physical Exam: General: NAD, alert, awake Cardiovascular: RRR, no murmurs appreciated  Respiratory: CTAB, normal WOB, no w/r/c. Abdomen:  Soft/NT/ND, no organomegaly  MSK: No erythema, no swelling, no bruising right UE. Mild TTP bicep tendon. No bone tenderness. Normal ROM abd/add UE. 5/5 MS. Neg empty can test, negative hawkins, negative obriens. + lift off test. NV intact distally.  Sits up in bed using full weight on right arm.  Skin: WWP  Neuro: CN 2-12  intact, some slurred speech (patient reports this is baseline), strength and sensation intact, FNF intact.  Psych: AAO x 3  Laboratory:  Recent Labs Lab 03/09/15 0555 03/09/15 0619 03/09/15 1345 03/10/15 0450  WBC 9.9  --  7.6 6.8  HGB 12.6* 16.3 11.7* 11.4*  HCT 37.0* 48.0 34.2* 33.4*  PLT 136*  --  136* 116*    Recent Labs Lab 03/09/15 1345 03/10/15 0450 03/11/15 0436  NA 134* 133* 130*  K 3.5 3.7 3.9  CL 93* 93* 91*  CO2 25 27 25   BUN 13 9 12   CREATININE 1.37* 1.25* 1.28*  CALCIUM 8.2* 7.8* 7.3*  PROT 6.4* 6.7 6.0*  BILITOT 5.0* 6.3* 6.4*  ALKPHOS 222* 223* 219*  ALT 143* 142* 128*  AST 310* 318* 218*  GLUCOSE 90 94 197*   A1c: 8.8 Ammonia 48 > 50 INR 1.46 >> 1.66  Imaging/Diagnostic Tests: EKG - NSR, prolonged QTc, J wave in II  CT Head: No CT evidence of acute intracranial abnormality.  Similar appearance of senescent brain volume loss, likely related to chronic microvascular ischemic disease.  RUQ [name]: Hepatomegaly with diffuse increase in liver echotexture. Suspect hepatic steatosis. There may be underlying parenchymal liver disease as well. While no focal liver lesions are identified, it must be cautioned that the sensitivity of ultrasound for focal liver lesions is diminished markedly in this circumstance. Study otherwise unremarkable.  Ma Hillock, DO 03/12/2015, 9:47 AM PGY-3, [place] Intern pager: [phone], text pages welcome

## 2015-03-12 NOTE — Clinical Social Work Note (Signed)
Clinical Social Work Assessment  Patient Details  Name: Jeffrey [name] MRN: [id number] Date of Birth: May 28, 1966  Date of referral:  03/11/15               Reason for consult:  Substance Use/ETOH Abuse                Permission sought to share information with:    Permission granted to share information::  No  Name::        Agency::     Relationship::     Contact Information:     Housing/Transportation Living arrangements for the past 2 months:  Apartment Source of Information:  Patient Patient Interpreter Needed:  None Criminal Activity/Legal Involvement Pertinent to Current Situation/Hospitalization:  No - Comment as needed Significant Relationships:  Spouse Lives with:  Spouse, Adult Children Do you feel safe going back to the place where you live?  Yes Need for family participation in patient care:  No (Coment)  Care giving concerns:  Patient states that he had been abstinent from alcohol for 8 months and about 3 weeks ago he started driking again by drinking 2 40oz beers daily. Patient stated that when he stopped previously he started withdrawal symptoms which were medically managed. Patient may request similar. Patient stated that he started drinking because of his level of stress. Patient states that recently his stress has increased because his wife is ill and will be hospitlized soon for surgical intervention. Patient is very clear that he is not interested in anyone else being in control over what he desires to do, this makes him reluctant to receive help.   Social Worker assessment / plan: CSW provided contact list for substance abuse and mental health services. CSW recommended [facility] and [facility] for ongoing treatment due to lack of insurance at this time.   Employment status:  Unemployed Forensic scientist:  Self Pay (Medicaid Pending) PT Recommendations:  Not assessed at this time Information / Referral to community resources:  SBIRT, Outpatient Substance Abuse  Treatment Options  Patient/Family's Response to care:  Patient began very reluctant but stated that he was agreeable to having someone to talk to and gaining additional supports to deal with his alcohol aaddiction  Patient/Family's Understanding of and Emotional Response to Diagnosis, Current Treatment, and Prognosis:  Patient is confident about his ability to stop using with some help with withdrawal symptoms. Patient recognizes that there are several life stressors but he recognizes that alcohol does not help.  Emotional Assessment Appearance:  Appears stated age Attitude/Demeanor/Rapport:  Guarded Affect (typically observed):  Defensive, Calm, Guarded Orientation:  Oriented to Self, Oriented to Place, Oriented to  Time, Oriented to Situation Alcohol / Substance use:  Alcohol Use Psych involvement (Current and /or in the community):  No (Comment)  Discharge Needs  Concerns to be addressed:  Compliance Issues Concerns, Coping/Stress Concerns, Employment/School Concerns, Substance Abuse Concerns, Financial / Insurance Concerns, Lack of Support Readmission within the last 30 days:  No Current discharge risk:  None Barriers to Discharge:  No Barriers Identified   Finlee Milo, [place], LCSW 03/12/2015, 12:12 PM

## 2015-03-12 NOTE — Progress Notes (Signed)
Pt discharging to home with wife. Discharge information and education reviewed with pt. IV dc'd. All questions answered/ Pt waiting for ride, Vitals stable. 03/12/2015 12:15 PM Jeffrey [name]

## 2015-03-14 LAB — URINE CULTURE: Colony Count: 80000

## 2015-03-20 ENCOUNTER — Ambulatory Visit (INDEPENDENT_AMBULATORY_CARE_PROVIDER_SITE_OTHER): Payer: Self-pay | Admitting: Family Medicine

## 2015-03-20 ENCOUNTER — Encounter: Payer: Self-pay | Admitting: Family Medicine

## 2015-03-20 VITALS — BP 141/91 | HR 75 | Temp 98.2°F | Ht 70.0 in | Wt 242.3 lb

## 2015-03-20 DIAGNOSIS — R748 Abnormal levels of other serum enzymes: Secondary | ICD-10-CM

## 2015-03-20 DIAGNOSIS — E119 Type 2 diabetes mellitus without complications: Secondary | ICD-10-CM

## 2015-03-20 DIAGNOSIS — R270 Ataxia, unspecified: Secondary | ICD-10-CM

## 2015-03-20 DIAGNOSIS — F1010 Alcohol abuse, uncomplicated: Secondary | ICD-10-CM

## 2015-03-20 LAB — COMPREHENSIVE METABOLIC PANEL
ALT: 108 U/L — AB (ref 0–53)
AST: 226 U/L — AB (ref 0–37)
Albumin: 2.9 g/dL — ABNORMAL LOW (ref 3.5–5.2)
Alkaline Phosphatase: 349 U/L — ABNORMAL HIGH (ref 39–117)
BUN: 4 mg/dL — ABNORMAL LOW (ref 6–23)
CALCIUM: 8.9 mg/dL (ref 8.4–10.5)
CO2: 27 mEq/L (ref 19–32)
CREATININE: 1.28 mg/dL (ref 0.50–1.35)
Chloride: 90 mEq/L — ABNORMAL LOW (ref 96–112)
Glucose, Bld: 230 mg/dL — ABNORMAL HIGH (ref 70–99)
Potassium: 3.6 mEq/L (ref 3.5–5.3)
SODIUM: 131 meq/L — AB (ref 135–145)
Total Bilirubin: 11.4 mg/dL — ABNORMAL HIGH (ref 0.2–1.2)
Total Protein: 6.4 g/dL (ref 6.0–8.3)

## 2015-03-20 NOTE — Assessment & Plan Note (Signed)
Jeffrey [name] is a 49 y.o. AAM present for hospital f/u:  - Diabetes: Patient is to take 18 units of Lantus at night, check morning fasting glucose and record these in a book and bring to his next appointment in one month. Patient encouraged to eat 3 small meals a day and a snack at the least. - Alcohol abuse: Congratulated patient on seeking treatment at [facility], he has Ativan that he is using for symptoms only. Last drink was prior to admission. - Elevated liver enzymes: Patient with alcoholic liver, will repeat enzymes today, and consider sending to liver clinic for cirrhosis workup. - Depression: Patient is doing okay, following up with [facility]. Taking Paxil 20 mg daily. - Unsteady gait: Patient admitted with ataxia, presumed to be secondary to his alcoholic intoxication and/or his prior history of anoxic brain injury. This is likely chronic in nature, discussed with him in detail to say that this may not be correctable. Have ordered PT referral for him to be evaluated in work with him in the outpatient setting. Consider neurology referral, it does not appear he had formal neurology follow-up after prior admission of anoxic brain injury.

## 2015-03-20 NOTE — Assessment & Plan Note (Signed)
Jeffrey [name] is a 49 y.o. AAM present for hospital f/u:  - Diabetes: Patient is to take 18 units of Lantus at night, check morning fasting glucose and record these in a book and bring to his next appointment in one month. Patient encouraged to eat 3 small meals a day and a snack at the least. - Alcohol abuse: Congratulated patient on seeking treatment at [facility], he has Ativan that he is using for symptoms only. Last drink was prior to admission. - Elevated liver enzymes: Patient with alcoholic liver, will repeat enzymes today, and consider sending to liver clinic for cirrhosis workup. - Depression: Patient is doing okay, following up with [facility]. Taking Paxil 20 mg daily. - Unsteady gait: Patient admitted with ataxia, presumed to be secondary to his alcoholic intoxication and/or his prior history of anoxic brain injury. This is likely chronic in nature, discussed with him in detail to say that this may not be correctable. Have ordered PT referral for him to be evaluated in work with him in the outpatient setting. Consider neurology referral, it does not appear he had formal neurology follow-up after prior admission of anoxic brain injury.

## 2015-03-20 NOTE — Assessment & Plan Note (Signed)
Jeffrey [name] is a 49 y.o. AAM present for hospital f/u:  - Diabetes: Patient is to take 18 units of Lantus at night, check morning fasting glucose and record these in a book and bring to his next appointment in one month. Patient encouraged to eat 3 small meals a day and a snack at the least. - Alcohol abuse: Congratulated patient on seeking treatment at [facility], he has Ativan that he is using for symptoms only. Last drink was prior to admission. - Elevated liver enzymes: Patient with alcoholic liver, will repeat enzymes today, and consider sending to liver clinic for cirrhosis workup. - Depression: Patient is doing okay, following up with [facility]. Taking Paxil 20 mg daily. - Unsteady gait: Patient admitted with ataxia, presumed to be secondary to his alcoholic intoxication and/or his prior history of anoxic brain injury. This is likely chronic in nature, discussed with him in detail to say that this may not be correctable. Have ordered PT referral for him to be evaluated in work with him in the outpatient setting. Consider neurology referral, it does not appear he had formal neurology follow-up after prior admission of anoxic brain injury.

## 2015-03-20 NOTE — Assessment & Plan Note (Signed)
TATSUYA MARANA is a 49 y.o. AAM present for hospital f/u:  - Diabetes: Patient is to take 18 units of Lantus at night, check morning fasting glucose and record these in a book and bring to his next appointment in one month. Patient encouraged to eat 3 small meals a day and a snack at the least. - Alcohol abuse: Congratulated patient on seeking treatment at [facility], he has Ativan that he is using for symptoms only. Last drink was prior to admission. - Elevated liver enzymes: Patient with alcoholic liver, will repeat enzymes today, and consider sending to liver clinic for cirrhosis workup. - Depression: Patient is doing okay, following up with [facility]. Taking Paxil 20 mg daily. - Unsteady gait: Patient admitted with ataxia, presumed to be secondary to his alcoholic intoxication and/or his prior history of anoxic brain injury. This is likely chronic in nature, discussed with him in detail to say that this may not be correctable. Have ordered PT referral for him to be evaluated in work with him in the outpatient setting. Consider neurology referral, it does not appear he had formal neurology follow-up after prior admission of anoxic brain injury.

## 2015-03-20 NOTE — Progress Notes (Signed)
   Subjective:    Patient ID: Jeffrey [name], male    DOB: September 26, 1966, 49 y.o.   MRN: [id number]  HPI  Hospital F/U:  Patient presents to family medicine clinic for hospital follow-up after being discharged from the hospital on 03/12/2015 for ataxia and acute alcoholic intoxication. Patient reports success with quitting alcohol consumption, he has not had a drink since prior to admittance to the hospital. He has followed up with [facility] and they are assisting him with his depression and his alcohol abuse. Patient states he continues to take the Paxil daily, and feels this overall will be very helpful for him. He still feels unsteady on his feet intermittently, stating sometimes he feels fine and other times he feels like he has an unsteady gait. He has not had any additional falls.  Diabetes: Patient states he has taken 20 units of Lantus at night now because he had some elevated blood sugars. He states the lowest blood sugar in the morning was in the high 70s, and the highest he has seen has been in the 190s. He still is not eating a regular diet, stating sometimes he eats and sometimes he skips meals.    Review of Systems Per HPI    Objective:   Physical Exam BP 141/91 mmHg  Pulse 75  Temp(Src) 98.2 F (36.8 C) (Oral)  Ht 5\' 10"  (1.778 m)  Wt 242 lb 4.8 oz (109.907 kg)  BMI 34.77 kg/m2 Gen: NAD. Nontoxic in appearance, well-developed, well-nourished, appears dry, African-American male. Cooperative with exam. Tearful at times. HEENT: AT. [place].  Bilateral eyes without injections or icterus. Tobacco product in his cheek.  CV: RRR no murmurs appreciated Chest: CTAB, no wheeze or crackles Abd: Soft. Round. NTND. BS present. No Masses palpated.  Ext: No erythema. No edema.  Skin: No rashes, purpura or petechiae. Warm and well-perfused, appears dry. Neuro: Occasional holding onto wall for balance, toes pointed outwards with walk. PERLA. EOMi. Alert. Oriented. No focal deficits. Psych:  Tearful at times, otherwise normal affect, mood and demeanor. Mild speech delay, chronic.    Assessment & Plan:  Jeffrey [name] is a 49 y.o. AAM present for hospital f/u:  - Diabetes: Patient is to take 18 units of Lantus at night, check morning fasting glucose and record these in a book and bring to his next appointment in one month. Patient encouraged to eat 3 small meals a day and a snack at the least. - Alcohol abuse: Congratulated patient on seeking treatment at [facility], he has Ativan that he is using for symptoms only. Last drink was prior to admission. - Elevated liver enzymes: Patient with alcoholic liver, will repeat enzymes today, and consider sending to liver clinic for cirrhosis workup. - Depression: Patient is doing okay, following up with [facility]. Taking Paxil 20 mg daily. - Unsteady gait: Patient admitted with ataxia, presumed to be secondary to his alcoholic intoxication and/or his prior history of anoxic brain injury. This is likely chronic in nature, discussed with him in detail to say that this may not be correctable. Have ordered PT referral for him to be evaluated in work with him in the outpatient setting. Consider neurology referral, it does not appear he had formal neurology follow-up after prior admission of anoxic brain injury.

## 2015-03-20 NOTE — Patient Instructions (Signed)
Please continue to take 18 units of Lantus every night, take your fasting blood glucose in the morning before eating or drinking anything. Ideally I would like this between 80 and 110. Monitor the sugars and write down, bring them to your next appointment to discuss. Attempt to eat at least 3 small meals a day and a snack. I will call you with your blood results once they become available. Please continue to take her Paxil, and follow-up with [facility] [place]. I have put in a referral to physical therapy, they will be calling you to set these appointments up. Please make sure these appointments work with your schedule so that you can get full benefit from the sessions. Follow-up in one month.

## 2015-03-22 ENCOUNTER — Telehealth: Payer: Self-pay | Admitting: Family Medicine

## 2015-03-22 DIAGNOSIS — K7460 Unspecified cirrhosis of liver: Secondary | ICD-10-CM | POA: Insufficient documentation

## 2015-03-22 DIAGNOSIS — K7030 Alcoholic cirrhosis of liver without ascites: Secondary | ICD-10-CM

## 2015-03-22 MED ORDER — LACTULOSE 10 GM/15ML PO SOLN: 10.0000 g | Freq: Every day | ORAL | Status: DC

## 2015-03-22 NOTE — Telephone Encounter (Signed)
Called patient to discuss his lab work with him. His CMET resulted with worsening liver enzymes and Bilirubin. A referral to liver clinic was placed for him ASAP/urgently. His MELD score was 18 pts (6.0% mortality). Lactulose was prescribed at low dose. Patient was notified of all the above and discussed meaning of liver disease. Pt stated he understood.    INR 1.66  Ref. Range 03/20/2015 15:34  Sodium Latest Ref Range: 135-145 mEq/L 131 (L)  Potassium Latest Ref Range: 3.5-5.3 mEq/L 3.6  Chloride Latest Ref Range: 96-112 mEq/L 90 (L)  CO2 Latest Ref Range: 19-32 mEq/L 27  BUN Latest Ref Range: 6-23 mg/dL 4 (L)  Creatinine Latest Ref Range: 0.50-1.35 mg/dL 1.28  Calcium Latest Ref Range: 8.4-10.5 mg/dL 8.9  Glucose Latest Ref Range: 70-99 mg/dL 230 (H)  Alkaline Phosphatase Latest Ref Range: 39-117 U/L 349 (H)  Albumin Latest Ref Range: 3.5-5.2 g/dL 2.9 (L)  AST Latest Ref Range: 0-37 U/L 226 (H)  ALT Latest Ref Range: 0-53 U/L 108 (H)  Total Protein Latest Ref Range: 6.0-8.3 g/dL 6.4  Total Bilirubin Latest Ref Range: 0.2-1.2 mg/dL 11.4 (H)   [place] DO PGY3

## 2015-03-28 ENCOUNTER — Telehealth: Payer: Self-pay | Admitting: Family Medicine

## 2015-03-28 MED ORDER — LORAZEPAM 0.5 MG PO TABS: 0.5000 mg | ORAL_TABLET | Freq: Three times a day (TID) | ORAL | Status: DC | PRN

## 2015-03-28 NOTE — Telephone Encounter (Signed)
Received a telephone note patient was shaky, has had a couple of nontraumatic falls since hospital discharge. He is requesting more Ativan for his "alcohol withdrawal". Patient was given 30 tabs which would have been a 10 day prescription for his alcohol withdrawal, and he is appropriately out of these if he was taking them as prescribed. 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 I discussed why he feels he is having more falls, and he states he is walking with a cane because his gait still feels unsteady. He denies any lower extremity weakness. Advised patient if his symptoms worsen, he has another fall, if he becomes confused or week he needs to seek treatment immediately in the emergency room. Otherwise I will refill his Ativan, at a lower dose of 0.5 mg 3 times a day. This prescription will be replaced out from for him to pick up tomorrow after 12. [place] DO PGY3

## 2015-03-28 NOTE — Telephone Encounter (Signed)
Pt is requesting a refill on lorazepam because he is "still having withdrawls from alcohol"  States that he still feels very shaky, so much so that he must use a cane everywhere he goes and has fallen multiple times since leaving the hospital.   He would like this called into [facility] on [address].  Advised I would send message to MD. Clinton Sawyer, Salome Spotted

## 2015-03-28 NOTE — Telephone Encounter (Signed)
Pt called and would like to speak to a nurse about the medication he is taking that is weaning him off adderall. He think that it is Lorazepam. Please call him when you get a chance. jw

## 2015-04-06 ENCOUNTER — Other Ambulatory Visit: Payer: Self-pay | Admitting: Family Medicine

## 2015-04-06 ENCOUNTER — Telehealth: Payer: Self-pay | Admitting: Family Medicine

## 2015-04-06 MED ORDER — METRONIDAZOLE 500 MG PO TABS: 2000.0000 mg | ORAL_TABLET | Freq: Once | ORAL | Status: DC

## 2015-04-06 NOTE — Telephone Encounter (Signed)
Patient's wife had an appointment today, I am the PCP for both patient and his wife. Patient's wife was recently diagnosed with trichomonas, and treated. His wife reports she has been monogamous with her husband, therefore we are calling in a prescription for him to be treated as well. She has asked me to wait to after 3 PM this afternoon so that she could go home and discuss this with him. I called in a one-time dose 2000 mg of Flagyl on April 06 2015 at 4:17 PM. Patient's wife was counseled that he is to drink absolutely no alcohol or taking this medication.

## 2015-05-22 ENCOUNTER — Ambulatory Visit (INDEPENDENT_AMBULATORY_CARE_PROVIDER_SITE_OTHER): Payer: Self-pay | Admitting: Family Medicine

## 2015-05-22 VITALS — BP 160/100 | Temp 99.4°F | Ht 70.0 in | Wt 247.8 lb

## 2015-05-22 DIAGNOSIS — E119 Type 2 diabetes mellitus without complications: Secondary | ICD-10-CM

## 2015-05-22 DIAGNOSIS — I10 Essential (primary) hypertension: Secondary | ICD-10-CM

## 2015-05-22 DIAGNOSIS — F411 Generalized anxiety disorder: Secondary | ICD-10-CM

## 2015-05-22 DIAGNOSIS — R748 Abnormal levels of other serum enzymes: Secondary | ICD-10-CM

## 2015-05-22 LAB — COMPLETE METABOLIC PANEL WITH GFR
ALT: 10 U/L (ref 9–46)
AST: 19 U/L (ref 10–35)
Albumin: 2.5 g/dL — ABNORMAL LOW (ref 3.6–5.1)
Alkaline Phosphatase: 141 U/L — ABNORMAL HIGH (ref 40–115)
BILIRUBIN TOTAL: 0.9 mg/dL (ref 0.2–1.2)
BUN: 5 mg/dL — ABNORMAL LOW (ref 7–25)
CO2: 27 mmol/L (ref 20–31)
Calcium: 8.9 mg/dL (ref 8.6–10.3)
Chloride: 101 mmol/L (ref 98–110)
Creat: 0.75 mg/dL (ref 0.70–1.33)
Glucose, Bld: 279 mg/dL — ABNORMAL HIGH (ref 65–99)
Potassium: 3.7 mmol/L (ref 3.5–5.3)
Sodium: 136 mmol/L (ref 135–146)
TOTAL PROTEIN: 6.8 g/dL (ref 6.1–8.1)

## 2015-05-22 MED ORDER — LORAZEPAM 0.5 MG PO TABS: 0.5000 mg | ORAL_TABLET | Freq: Three times a day (TID) | ORAL | Status: DC | PRN

## 2015-05-22 MED ORDER — CITALOPRAM HYDROBROMIDE 20 MG PO TABS: 20.0000 mg | ORAL_TABLET | Freq: Every day | ORAL | Status: DC

## 2015-05-22 MED ORDER — LOSARTAN POTASSIUM 50 MG PO TABS: 50.0000 mg | ORAL_TABLET | Freq: Every day | ORAL | Status: DC

## 2015-05-22 MED ORDER — CLONIDINE HCL 0.1 MG PO TABS
0.1000 mg | ORAL_TABLET | Freq: Once | ORAL | Status: AC
  Administered 2015-05-22: 0.1 mg via ORAL

## 2015-05-22 NOTE — Patient Instructions (Addendum)
Thank you for coming to see me today. It was a pleasure. Today we talked about:   Elevated liver enzymes: I will recheck your blood work  Diabetes: Please start checking your blood sugar in the morning before eating. I will check your A1C in one month  Hypertension: I will start you on losartan 50mg  daily  Anxiety: I will refill your Lorazepam and start you on Celexa 20mg  daily. I am discontinuing your Paxil.  Please make an appointment to see me in one month for follow-up of chronic issues.  If you have any questions or concerns, please do not hesitate to call the office at [phone].  Sincerely,  Cordelia Poche, MD  Citalopram tablets What is this medicine? CITALOPRAM (sye TAL oh pram) is a medicine for depression. This medicine may be used for other purposes; ask your health care provider or pharmacist if you have questions. COMMON BRAND NAME(S): Celexa What should I tell my health care provider before I take this medicine? They need to know if you have any of these conditions: -bipolar disorder or a family history of bipolar disorder -diabetes -glaucoma -heart disease -history of irregular heartbeat -kidney or liver disease -low levels of magnesium or potassium in the blood -receiving electroconvulsive therapy -seizures (convulsions) -suicidal thoughts or a previous suicide attempt -an unusual or allergic reaction to citalopram, escitalopram, other medicines, foods, dyes, or preservatives -pregnant or trying to become pregnant -breast-feeding How should I use this medicine? Take this medicine by mouth with a glass of water. Follow the directions on the prescription label. You can take it with or without food. Take your medicine at regular intervals. Do not take your medicine more often than directed. Do not stop taking this medicine suddenly except upon the advice of your doctor. Stopping this medicine too quickly may cause serious side effects or your condition may  worsen. A special MedGuide will be given to you by the pharmacist with each prescription and refill. Be sure to read this information carefully each time. Talk to your pediatrician regarding the use of this medicine in children. Special care may be needed. Patients over 86 years old may have a stronger reaction and need a smaller dose. Overdosage: If you think you have taken too much of this medicine contact a poison control center or emergency room at once. NOTE: This medicine is only for you. Do not share this medicine with others. What if I miss a dose? If you miss a dose, take it as soon as you can. If it is almost time for your next dose, take only that dose. Do not take double or extra doses. What may interact with this medicine? Do not take this medicine with any of the following medications: -certain medicines for fungal infections like fluconazole, itraconazole, ketoconazole, posaconazole, voriconazole -cisapride -dofetilide -dronedarone -escitalopram -linezolid -MAOIs like Carbex, Eldepryl, Marplan, Nardil, and Parnate -methylene blue (injected into a vein) -pimozide -thioridazine -ziprasidone This medicine may also interact with the following medications: -alcohol -aspirin and aspirin-like medicines -carbamazepine -certain medicines for depression, anxiety, or psychotic disturbances -certain medicines for infections like chloroquine, clarithromycin, erythromycin, furazolidone, isoniazid, pentamidine -certain medicines for migraine headaches like almotriptan, eletriptan, frovatriptan, naratriptan, rizatriptan, sumatriptan, zolmitriptan -certain medicines for sleep -certain medicines that treat or prevent blood clots like dalteparin, enoxaparin, warfarin -cimetidine -diuretics -fentanyl -lithium -methadone -metoprolol -NSAIDs, medicines for pain and inflammation, like ibuprofen or naproxen -omeprazole -other medicines that prolong the QT interval (cause an abnormal  heart rhythm) -procarbazine -rasagiline -supplements like St.  John's wort, kava kava, valerian -tramadol -tryptophan This list may not describe all possible interactions. Give your health care provider a list of all the medicines, herbs, non-prescription drugs, or dietary supplements you use. Also tell them if you smoke, drink alcohol, or use illegal drugs. Some items may interact with your medicine. What should I watch for while using this medicine? Tell your doctor if your symptoms do not get better or if they get worse. Visit your doctor or health care professional for regular checks on your progress. Because it may take several weeks to see the full effects of this medicine, it is important to continue your treatment as prescribed by your doctor. Patients and their families should watch out for new or worsening thoughts of suicide or depression. Also watch out for sudden changes in feelings such as feeling anxious, agitated, panicky, irritable, hostile, aggressive, impulsive, severely restless, overly excited and hyperactive, or not being able to sleep. If this happens, especially at the beginning of treatment or after a change in dose, call your health care professional. Dennis Bast may get drowsy or dizzy. Do not drive, use machinery, or do anything that needs mental alertness until you know how this medicine affects you. Do not stand or sit up quickly, especially if you are an older patient. This reduces the risk of dizzy or fainting spells. Alcohol may interfere with the effect of this medicine. Avoid alcoholic drinks. Your mouth may get dry. Chewing sugarless gum or sucking hard candy, and drinking plenty of water will help. Contact your doctor if the problem does not go away or is severe. What side effects may I notice from receiving this medicine? Side effects that you should report to your doctor or health care professional as soon as possible: -allergic reactions like skin rash, itching or hives,  swelling of the face, lips, or tongue -chest pain -confusion -dizziness -fast, irregular heartbeat -fast talking and excited feelings or actions that are out of control -feeling faint or lightheaded, falls -hallucination, loss of contact with reality -seizures -shortness of breath -suicidal thoughts or other mood changes -unusual bleeding or bruising Side effects that usually do not require medical attention (report to your doctor or health care professional if they continue or are bothersome): -blurred vision -change in appetite -change in sex drive or performance -headache -increased sweating -nausea -trouble sleeping This list may not describe all possible side effects. Call your doctor for medical advice about side effects. You may report side effects to FDA at 1-800-FDA-1088. Where should I keep my medicine? Keep out of reach of children. Store at room temperature between 15 and 30 degrees C (59 and 86 degrees F). Throw away any unused medicine after the expiration date. NOTE: This sheet is a summary. It may not cover all possible information. If you have questions about this medicine, talk to your doctor, pharmacist, or health care provider.  2015, Elsevier/Gold Standard. (2013-04-16 13:19:48)

## 2015-05-22 NOTE — Progress Notes (Signed)
    Subjective    Jeffrey [name] is a 49 y.o. male that presents for a follow-up visit for chronic issues.   1. Elevated liver enzymes: Was found to have elevated liver enzymes. He was prescribed lactulose which he took for almost one month. He states that after 15 days or so, he was having improvement in symptoms. He reports some swelling in arms and legs that is worse in his legs that started about one month ago. He has had a problem with this in the past and was prescribed "fluid pills." Symptoms are worse throughout the day and improve after sleeping.   2. Diabetes mellitus: Checking CBGs at night which run in 200s after meals. He is adherent with Lantus 15u. He states that he used to be on Novolog but does not anymore because of frequent hypoglycemia.  3. Hypertension: Adherent with metoprolol 100mg  XL. No chest pain or shortness of breath. He took his metoprolol this morning  4. Anxiety: Has been on Paxil and Lorazepam. He states the Paxil makes him feel bad. Lorazepam helps him more. He does not remember using any other SSRIs for management. Anxiety causes him to feel like "getting into a ball and not interacting with anybody." GAD 7 of 17. PHQ-9 of 19  Social History  Substance Use Topics  . Smoking status: Never Smoker   . Smokeless tobacco: Current User    Types: Snuff  . Alcohol Use: 3.6 oz/week    6 Cans of beer per week    Allergies  Allergen Reactions  . Lisinopril Other (See Comments)    Pt reports nose bleed.    Meds ordered this encounter  Medications  . DISCONTD: citalopram (CELEXA) 20 MG tablet    Sig: Take 1 tablet (20 mg total) by mouth daily.    Dispense:  30 tablet    Refill:  3  . DISCONTD: losartan (COZAAR) 50 MG tablet    Sig: Take 1 tablet (50 mg total) by mouth daily.    Dispense:  30 tablet    Refill:  1  . cloNIDine (CATAPRES) tablet 0.1 mg    Sig:   . losartan (COZAAR) 50 MG tablet    Sig: Take 1 tablet (50 mg total) by mouth daily.   Dispense:  30 tablet    Refill:  1  . LORazepam (ATIVAN) 0.5 MG tablet    Sig: Take 1 tablet (0.5 mg total) by mouth every 8 (eight) hours as needed for anxiety or seizure.    Dispense:  90 tablet    Refill:  0  . citalopram (CELEXA) 20 MG tablet    Sig: Take 1 tablet (20 mg total) by mouth daily.    Dispense:  30 tablet    Refill:  3    ROS  Per HPI   Objective   BP 160/100 mmHg  Temp(Src) 99.4 F (37.4 C) (Oral)  Ht 5\' 10"  (1.778 m)  Wt 247 lb 12.8 oz (112.401 kg)  BMI 35.56 kg/m2  General: Well appearing, no distress Extremities: 1+ edema in ankles bilaterally  Assessment and Plan   Please refer to problem based charting of assessment and plan

## 2015-05-23 NOTE — Assessment & Plan Note (Signed)
Refill lorazepam. Start Celexa 20mg 

## 2015-05-23 NOTE — Assessment & Plan Note (Signed)
Patient with extremely high blood pressure. Given Clonidine 0.1mg  in office. Since patient adherent with metoprolol, will add another agent. Will star losartan 50mg  daily since he has a listed allergy to lisinopril (not angioedema)

## 2015-05-23 NOTE — Assessment & Plan Note (Signed)
Recheck today. Appears to be asymptomatic

## 2015-05-23 NOTE — Assessment & Plan Note (Signed)
Instructed to check fasting blood sugars. Will check A1C in one month.

## 2015-06-01 ENCOUNTER — Encounter: Payer: Self-pay | Admitting: Family Medicine

## 2015-07-24 ENCOUNTER — Other Ambulatory Visit: Payer: Self-pay | Admitting: Family Medicine

## 2015-07-24 MED ORDER — INSULIN GLARGINE 100 UNIT/ML ~~LOC~~ SOLN: 15.0000 [IU] | Freq: Every day | SUBCUTANEOUS | Status: DC

## 2015-07-24 NOTE — Telephone Encounter (Signed)
Pt calling and states that he needs a refill on his insulin glargine (LANTUS) sent to the [facility]. Pt states that he has been without this medication for four days. Sadie Reynolds, ASA

## 2015-07-27 ENCOUNTER — Telehealth: Payer: Self-pay | Admitting: Pharmacist

## 2015-07-27 ENCOUNTER — Telehealth: Payer: Self-pay | Admitting: *Deleted

## 2015-07-27 DIAGNOSIS — E119 Type 2 diabetes mellitus without complications: Secondary | ICD-10-CM

## 2015-07-27 MED ORDER — INSULIN GLARGINE 100 UNIT/ML SOLOSTAR PEN: 20.0000 [IU] | PEN_INJECTOR | Freq: Every day | SUBCUTANEOUS | Status: DC

## 2015-07-27 NOTE — Telephone Encounter (Signed)
Asked to consult with patient about severe hyperglycemia (429)  when he ran out of his Lantus supply (7 days without medication).  Provided Lantus sample and directly observed patient self-administer 20 units.  Counseled to continue Lantus 20 units daily and to monitor blood sugars each day.  Call patient Monday, 10/24 to reassess glucose control and adjust insulin dose.  Counseled patient on signs and symptoms of hypoglycemia and how to manage such events.  Patient requested prescription to be sent to [facility] to obtain consistent supply.  Patient will return to clinic for PCP appointment on Friday, 10/28.  Medication Samples have been provided to the patient.  Drug name: Lantus Solostar  Qty: 1   LOT: [id number]  Exp.Date: 08/06/2017  The patient has been instructed regarding the correct time, dose, and frequency of taking this medication, including desired effects and most common side effects.   Leavy Cella 4:33 PM 07/27/2015

## 2015-07-27 NOTE — Telephone Encounter (Signed)
Patient walked into clinic today requesting to speak with nurse regarding his blood glucose level and insulin.  Patient stated before he came to clinic his blood glucose was 429.  He has been out of his medication for about 7 days. Went to [facility] but they did not have a prescription nor could he pay for it. They told him he would need to apply for [facility].  Precept with Dr. Valentina Lucks to check if we could give him a sample of Lantus.  Dr. Valentina Lucks had Quita Skye, Pharmacist student consult with patient. Will forward chart to Dr. Salley Hews, Rosine Beat, RN

## 2015-07-27 NOTE — Telephone Encounter (Signed)
Reviewed and agree.

## 2015-07-27 NOTE — Telephone Encounter (Signed)
See other phone note from today.

## 2015-07-27 NOTE — Assessment & Plan Note (Signed)
Asked to consult with patient about severe hyperglycemia (429)  when he ran out of his Lantus supply (7 days without medication).  Provided Lantus sample and directly observed patient self-administer 20 units.  Counseled to continue Lantus 20 units daily and to monitor blood sugars each day.  Call patient Monday, 10/24 to reassess glucose control and adjust insulin dose.  Counseled patient on signs and symptoms of hypoglycemia and how to manage such events.  Patient requested prescription to be sent to [facility] to obtain consistent supply.  Patient will return to clinic for PCP appointment on Friday, 10/28.

## 2015-07-27 NOTE — Telephone Encounter (Signed)
New Rx phoned into [facility] Up to 30 units daily and PRN refills Lantus Solostar.

## 2015-08-04 ENCOUNTER — Encounter: Payer: Self-pay | Admitting: Family Medicine

## 2015-08-04 ENCOUNTER — Ambulatory Visit (INDEPENDENT_AMBULATORY_CARE_PROVIDER_SITE_OTHER): Payer: Self-pay | Admitting: Family Medicine

## 2015-08-04 VITALS — BP 126/88 | HR 79 | Temp 98.1°F | Ht 70.0 in | Wt 226.6 lb

## 2015-08-04 DIAGNOSIS — E119 Type 2 diabetes mellitus without complications: Secondary | ICD-10-CM

## 2015-08-04 DIAGNOSIS — E1142 Type 2 diabetes mellitus with diabetic polyneuropathy: Secondary | ICD-10-CM

## 2015-08-04 DIAGNOSIS — I10 Essential (primary) hypertension: Secondary | ICD-10-CM

## 2015-08-04 DIAGNOSIS — F411 Generalized anxiety disorder: Secondary | ICD-10-CM

## 2015-08-04 LAB — POCT GLYCOSYLATED HEMOGLOBIN (HGB A1C): Hemoglobin A1C: 10.1

## 2015-08-04 MED ORDER — GABAPENTIN 100 MG PO CAPS: 100.0000 mg | ORAL_CAPSULE | Freq: Three times a day (TID) | ORAL | Status: DC

## 2015-08-04 NOTE — Patient Instructions (Signed)
Thank you for coming to see me today. It was a pleasure. Today we talked about:   Diabetes: no changes today.  Nerve pain: I will start gabapentin 100mg  three times per day  Hypertension: no changes to your blood pressure medication  Anxiety: no changes to your medications  Please make an appointment to see me in one month for follow-up.  If you have any questions or concerns, please do not hesitate to call the office at [phone].  Sincerely,  Cordelia Poche, MD

## 2015-08-04 NOTE — Assessment & Plan Note (Addendum)
Patient with improved control. Patient adherent with lantus now. A1C worsened since patient was not compliant. Will need to watch his CBGs with restarting his regimen.

## 2015-08-04 NOTE — Progress Notes (Signed)
    Subjective    Jeffrey [name] is a 49 y.o. male that presents for a follow-up visit for chronic issues.   1. Diabetes: Patient states until recently he was not taking his insulin regularly secondary to not being able to afford refills. He was with without his insulin for three weeks. Over the last two weeks, he has had fasting CBGs in the 110-180 range. He has been taking Lantus 20u daily.    2. Nerve pain: This is a chronic issue. Symptoms have resolved in the past but current episode started about one month ago. The last time he had this pain, he states he was given some medication which helped. No sores on his foot and he checks every night.  3. Anxiety: Patient states he has been taking Celexa which has helped with his anxiety. He reports some side effects, including talking in his sleep. He has not been taking Ativan as he has ran out.  4. Hypertension: Patient adherent with metoprolol 100mg  daily. He has not been taking losartan secondary to cost.  Social History  Substance Use Topics  . Smoking status: Never Smoker   . Smokeless tobacco: Current User    Types: Snuff  . Alcohol Use: 3.6 oz/week    6 Cans of beer per week    Allergies  Allergen Reactions  . Lisinopril Other (See Comments)    Pt reports nose bleed.    No orders of the defined types were placed in this encounter.    ROS  Per HPI   Objective   BP 126/88 mmHg  Pulse 79  Temp(Src) 98.1 F (36.7 C) (Oral)  Ht 5\' 10"  (1.778 m)  Wt 226 lb 9.6 oz (102.785 kg)  BMI 32.51 kg/m2  SpO2 98%  General: Well appearing, no distress Extremities: See flow sheet for foot exam  Assessment and Plan    HYPERTENSION, BENIGN SYSTEMIC Controlled. No changes to regimen. Discontinue Losartan since patient cannot afford at this  Time.  DM type 2 goal A1C below 7.5 Patient with improved control. Patient adherent with lantus now. A1C worsened since patient was not compliant.  Diabetic polyneuropathy associated with  type 2 diabetes mellitus ([place]) Patient with good sensation. Will trial gabapentin starting at 100mg  TID.  Anxiety state Appears to have benefit from starting Celexa. Side effects improving. Recommend continue Celexa 20mg  daily.

## 2015-08-04 NOTE — Assessment & Plan Note (Signed)
Controlled. No changes to regimen. Discontinue Losartan since patient cannot afford at this  Time.

## 2015-08-04 NOTE — Assessment & Plan Note (Addendum)
Appears to have benefit from starting Celexa. Side effects improving. Recommend continue Celexa 20mg  daily.

## 2015-08-04 NOTE — Assessment & Plan Note (Signed)
Patient with good sensation. Will trial gabapentin starting at 100mg  TID.

## 2015-08-17 ENCOUNTER — Other Ambulatory Visit: Payer: Self-pay | Admitting: Family Medicine

## 2015-08-17 NOTE — Telephone Encounter (Signed)
Pt is requesting refill on metoprolol to be sent to the [facility]. And lorazepam to be sent to [facility]

## 2015-08-25 ENCOUNTER — Telehealth: Payer: Self-pay | Admitting: Family Medicine

## 2015-08-25 MED ORDER — METOPROLOL SUCCINATE ER 100 MG PO TB24: ORAL_TABLET | ORAL | Status: DC

## 2015-08-25 MED ORDER — LORAZEPAM 0.5 MG PO TABS: 0.5000 mg | ORAL_TABLET | Freq: Three times a day (TID) | ORAL | Status: DC | PRN

## 2015-08-25 NOTE — Telephone Encounter (Signed)
Error

## 2015-08-28 ENCOUNTER — Telehealth: Payer: Self-pay | Admitting: *Deleted

## 2015-08-28 NOTE — Telephone Encounter (Signed)
Received fax from [facility] stating metoprolol succinate (TOPROL-XL) 100 MG 24 hr tablet is no longer available.  They can provide metoprolol tartrate at the dosage/ mg BID.  Please send new Rx if considering changing.  Derl Barrow, RN

## 2015-08-29 ENCOUNTER — Telehealth: Payer: Self-pay | Admitting: *Deleted

## 2015-08-29 NOTE — Telephone Encounter (Signed)
Recommend patient stay on metoprolol succinate. Please ask patient if he would be able to afford copay at regular pharmacy. If not, will have to do the best we can with management. Thanks!

## 2015-08-29 NOTE — Telephone Encounter (Signed)
-----   Message from Mariel Aloe, MD sent at 08/25/2015  3:53 PM EST ----- Regarding: Patient medication Please call patient to tell him his prescription is available for pickup at the front office.

## 2015-08-29 NOTE — Telephone Encounter (Signed)
With heart history, patient would benefit more from succinate formulation than tartrate formulation. Is there any way to accommodate this?

## 2015-08-29 NOTE — Telephone Encounter (Signed)
PT informed of below. Zimmerman Rumple, Islay Polanco D, CMA  

## 2015-08-29 NOTE — Telephone Encounter (Signed)
No, unless medication is sent to a different pharmacy.  Your team can call and ask patient what his preference would be.  Derl Barrow, RN

## 2015-08-30 ENCOUNTER — Telehealth: Payer: Self-pay | Admitting: Pharmacist

## 2015-08-30 NOTE — Telephone Encounter (Signed)
LM with male to have pt call back to discuss below. [name], Jeffrey [name], [name]

## 2015-08-30 NOTE — Telephone Encounter (Signed)
Helene Kelp from [facility] reports patient is in the process of certfying with [facility] and is requesting samples of Lantus.   2 lantus Solostar pens were provided.   Medication Samples have been provided to the patient.  Drug name: lantus solostar pen  Qty: 2  LOT: [id number]  Exp.Date: 10/06/2017  The patient has been instructed regarding the correct time, dose, and frequency of taking this medication, including desired effects and most common side effects.   Leavy Cella 12:15 PM 08/30/2015

## 2015-08-30 NOTE — Telephone Encounter (Signed)
Pt returned call and i informed him of below.  He stated that [facility] his Rx so he is going to pick it up on Friday.  I told him that doctor recommended him stay on the regular medication and that if for some reason the medicine is different than what he normally takes then to call us and we could call it into a pharmacy.  He stated that he does have the Rx discount cards that [facility] could [facility]  Told him that he could contact the pharmacies to see what they charge to see which may be cheaper if we indeed have to send it to a pharmacy. [name], Dewitt Judice D, [name]

## 2015-09-12 ENCOUNTER — Observation Stay (HOSPITAL_COMMUNITY)
Admission: EM | Admit: 2015-09-12 | Discharge: 2015-09-13 | Disposition: A | Discharge status: Home / Self Care | Payer: Self-pay | Attending: Family Medicine | Admitting: Family Medicine

## 2015-09-12 ENCOUNTER — Emergency Department (HOSPITAL_COMMUNITY): Payer: Self-pay

## 2015-09-12 ENCOUNTER — Encounter (HOSPITAL_COMMUNITY): Payer: Self-pay | Admitting: Emergency Medicine

## 2015-09-12 DIAGNOSIS — E872 Acidosis, unspecified: Secondary | ICD-10-CM | POA: Diagnosis present

## 2015-09-12 DIAGNOSIS — Z794 Long term (current) use of insulin: Secondary | ICD-10-CM | POA: Insufficient documentation

## 2015-09-12 DIAGNOSIS — G4733 Obstructive sleep apnea (adult) (pediatric): Secondary | ICD-10-CM | POA: Insufficient documentation

## 2015-09-12 DIAGNOSIS — E119 Type 2 diabetes mellitus without complications: Secondary | ICD-10-CM | POA: Insufficient documentation

## 2015-09-12 DIAGNOSIS — R7989 Other specified abnormal findings of blood chemistry: Secondary | ICD-10-CM

## 2015-09-12 DIAGNOSIS — K219 Gastro-esophageal reflux disease without esophagitis: Secondary | ICD-10-CM | POA: Insufficient documentation

## 2015-09-12 DIAGNOSIS — F10929 Alcohol use, unspecified with intoxication, unspecified: Secondary | ICD-10-CM

## 2015-09-12 DIAGNOSIS — F10120 Alcohol abuse with intoxication, uncomplicated: Secondary | ICD-10-CM | POA: Insufficient documentation

## 2015-09-12 DIAGNOSIS — Z8674 Personal history of sudden cardiac arrest: Secondary | ICD-10-CM | POA: Insufficient documentation

## 2015-09-12 DIAGNOSIS — R4182 Altered mental status, unspecified: Principal | ICD-10-CM | POA: Diagnosis present

## 2015-09-12 DIAGNOSIS — I10 Essential (primary) hypertension: Secondary | ICD-10-CM | POA: Insufficient documentation

## 2015-09-12 DIAGNOSIS — Z9981 Dependence on supplemental oxygen: Secondary | ICD-10-CM | POA: Insufficient documentation

## 2015-09-12 DIAGNOSIS — R740 Nonspecific elevation of levels of transaminase and lactic acid dehydrogenase [LDH]: Secondary | ICD-10-CM | POA: Insufficient documentation

## 2015-09-12 DIAGNOSIS — F919 Conduct disorder, unspecified: Secondary | ICD-10-CM | POA: Insufficient documentation

## 2015-09-12 DIAGNOSIS — E7800 Pure hypercholesterolemia, unspecified: Secondary | ICD-10-CM | POA: Insufficient documentation

## 2015-09-12 DIAGNOSIS — F419 Anxiety disorder, unspecified: Secondary | ICD-10-CM | POA: Insufficient documentation

## 2015-09-12 DIAGNOSIS — Z79899 Other long term (current) drug therapy: Secondary | ICD-10-CM | POA: Insufficient documentation

## 2015-09-12 LAB — CBC WITH DIFFERENTIAL/PLATELET
BASOS PCT: 1 %
Basophils Absolute: 0.1 10*3/uL (ref 0.0–0.1)
EOS ABS: 0.1 10*3/uL (ref 0.0–0.7)
Eosinophils Relative: 2 %
HEMATOCRIT: 35.3 % — AB (ref 39.0–52.0)
HEMOGLOBIN: 12 g/dL — AB (ref 13.0–17.0)
Lymphocytes Relative: 44 %
Lymphs Abs: 2.6 10*3/uL (ref 0.7–4.0)
MCH: 30.6 pg (ref 26.0–34.0)
MCHC: 34 g/dL (ref 30.0–36.0)
MCV: 90.1 fL (ref 78.0–100.0)
Monocytes Absolute: 0.5 10*3/uL (ref 0.1–1.0)
Monocytes Relative: 8 %
NEUTROS ABS: 2.6 10*3/uL (ref 1.7–7.7)
NEUTROS PCT: 45 %
Platelets: 148 10*3/uL — ABNORMAL LOW (ref 150–400)
RBC: 3.92 MIL/uL — AB (ref 4.22–5.81)
RDW: 16.7 % — ABNORMAL HIGH (ref 11.5–15.5)
WBC: 5.8 10*3/uL (ref 4.0–10.5)

## 2015-09-12 LAB — I-STAT CG4 LACTIC ACID, ED
LACTIC ACID, VENOUS: 3.67 mmol/L — AB (ref 0.5–2.0)
Lactic Acid, Venous: 2.97 mmol/L (ref 0.5–2.0)

## 2015-09-12 LAB — COMPREHENSIVE METABOLIC PANEL
ALBUMIN: 3.1 g/dL — AB (ref 3.5–5.0)
ALK PHOS: 112 U/L (ref 38–126)
ALT: 37 U/L (ref 17–63)
ANION GAP: 15 (ref 5–15)
AST: 63 U/L — ABNORMAL HIGH (ref 15–41)
BILIRUBIN TOTAL: 0.4 mg/dL (ref 0.3–1.2)
BUN: <5 mg/dL — ABNORMAL LOW (ref 6–20)
CALCIUM: 8.9 mg/dL (ref 8.9–10.3)
CO2: 24 mmol/L (ref 22–32)
Chloride: 100 mmol/L — ABNORMAL LOW (ref 101–111)
Creatinine, Ser: 0.83 mg/dL (ref 0.61–1.24)
GFR calc Af Amer: >60 mL/min (ref >60)
GFR calc non Af Amer: >60 mL/min (ref >60)
Glucose, Bld: 176 mg/dL — ABNORMAL HIGH (ref 65–99)
POTASSIUM: 3.3 mmol/L — AB (ref 3.5–5.1)
SODIUM: 139 mmol/L (ref 135–145)
TOTAL PROTEIN: 6.9 g/dL (ref 6.5–8.1)

## 2015-09-12 LAB — CK: CK TOTAL: 107 U/L (ref 49–397)

## 2015-09-12 LAB — AMMONIA: AMMONIA: 42 umol/L — AB (ref 9–35)

## 2015-09-12 LAB — CBG MONITORING, ED: GLUCOSE-CAPILLARY: 167 mg/dL — AB (ref 65–99)

## 2015-09-12 LAB — ETHANOL: Alcohol, Ethyl (B): 349 mg/dL (ref <5)

## 2015-09-12 MED ORDER — SODIUM CHLORIDE 0.9 % IV SOLN
1000.0000 mL | INTRAVENOUS | Status: DC
  Administered 2015-09-12: 1000 mL via INTRAVENOUS

## 2015-09-12 MED ORDER — SODIUM CHLORIDE 0.9 % IV SOLN
1000.0000 mL | Freq: Once | INTRAVENOUS | Status: AC
  Administered 2015-09-12: 1000 mL via INTRAVENOUS

## 2015-09-12 NOTE — ED Notes (Signed)
Pt ambulated to the bathroom without difficulty.  

## 2015-09-12 NOTE — ED Notes (Signed)
Patient stated that he has not taken his blood pressure medication. Usually its taken around 8pm.

## 2015-09-12 NOTE — H&P (Signed)
[facility] Admission History and Physical Service Pager: [phone]  Patient name: Jeffrey [name] Medical record number: [id number] Date of birth: Dec 01, 1965 Age: 49 y.o. Gender: male  Primary Care Provider: Cordelia Poche, MD Consultants: None Code Status: Full  Chief Complaint: AMS  Assessment and Plan: Jeffrey [name] is a 48 y.o. male presenting with altered mental status. PMH is significant for alcohol abuse, cardiac arrest, HTN, DM II, and OSA.  #Lactic Acidosis: lactic acid on admission was 3.67. No signs of infection/sepsis as source. Believe this to be type B lactic acidosis from alcoholism. No signs of systemic hypoperfusion; BP elevated. Renal function at baseline.  -Admit to observation under Dr. Ardelia Mems - trend lactic acid; goal <2 -continue fluids overnight  #AMS, resolved: Most likely due to acute toxic metabolic encephalopathy from acute alcohol intoxication with Celexa. Mentation on admission much improved. Patient A&Ox4. No history of seizure activity. No signs of seizure and this being a post-ictal state. CT head with no acute process. -consider AM consult to Neuro for opninion -monitor  #Alcohol Abuse: Ethanol upon presentation was 349; patient endorses alcohol use earlier in afternoon. Denies excessive EtOH for the past several months. Says he has cut back. Acutely intoxicated.  - CIWA protocol - thiamine, folate - monitor for withdrawl   #Hypokalemia, mild: K 3.3 on admission labs. -replete with 23meq K one time dose -recheck on AM labs  #DM II, poorly controlled: Last A1C 10.1 in October 2016. - give Lantus dose 10U tonight;e dose 20U - did not take Lantus this evening - Moderate SSI with meals - Adjust as needed   # Hypertension- Home medication Toprol 100mg . BPs elevated in ED. Did not take BP medication.  - Continue home medication  # Obstructive Sleep Apnea- History of refusing CPAP in past - Will order CPAP nightly.    FEN/GI: Diet HH/carb modified; IV fluids at 165mL for 6 hours Prophylaxis: Hep sq  Disposition: Admit for observation  History of Present Illness:  Jeffrey [name] is a 49 y.o. male presenting with altered mental status.   Patient states that his wife brought him into the ED because she stated he was confused. Patient does not remember anything after around about 1pm yesterday. States that he just restarted taking his Celexa. First dose was yesterday afternoon. Around that same time patient endorses drinking two 16oz beers. He is unsure whether the alcohol or Celexa made him sedated. He denies any confusion currently. Denies any chest pain, dyspnea, abdominal pain, and HA. Of note, patient states he usually takes all his home medications at night. He did not take his diabetes or BP medications.  Per ED note, patient was combative and confused when EMS arrived.   Review Of Systems: Per HPI. Otherwise the remainder of the systems were negative.  Patient Active Problem List   Diagnosis Date Noted  . Diabetic polyneuropathy associated with type 2 diabetes mellitus ([place]) 08/04/2015  . Hepatic cirrhosis ([place]) 03/22/2015  . Ataxia 03/20/2015  . Aphasia   . Elevated liver enzymes   . Toxic metabolic encephalopathy 123456  . Dysarthria 03/09/2015  . AKI (acute kidney injury) ([place])   . Alcohol intoxication ([place])   . Abdominal pain 11/17/2014  . Acute blood loss anemia 11/17/2014  . Hematemesis with nausea   . Alcohol abuse   . Hyperkalemia   . Tachycardia   . Hematemesis 11/16/2014  . Gastrointestinal hemorrhage with melena 10/10/2014  . Erosive esophagitis 10/10/2014  . HLD (hyperlipidemia)   .  Anemia due to other cause   . Hyperglycemia   . Syncope and collapse   . Dehydration 05/18/2014  . OSA on CPAP 12/28/2012  . Pulmonary edema 12/08/2012  . Hypoglycemia 12/07/2012  . Cardiac arrest ([place]) 12/07/2012  . Obesity 12/07/2012  . Anoxic brain injury ([place]) 12/07/2012  . GI  bleed 11/26/2012  . Venous insufficiency 12/13/2011  . INSOMNIA, CHRONIC 02/14/2009  . 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 ALCOHOL ABUSE, HX OF 11/10/2007  . ERECTILE DYSFUNCTION 04/14/2007  . DM type 2 goal A1C below 7.5 12/04/2006  . HYPERCHOLESTEROLEMIA 12/04/2006  . OBESITY, NOS 12/04/2006  . Anxiety state 12/04/2006  . HYPERTENSION, BENIGN SYSTEMIC 12/04/2006  . ARTHRITIS 12/04/2006    Past Medical History: Past Medical History  Diagnosis Date  . Hypertension   . Blood transfusion 2014    "related to bleeding ulcer"  . Venous insufficiency 12/13/2011  . Cardiac arrest ([place]) 12/07/2012    Anoxic encephalopathy  . Anxiety   . Acute renal insufficiency 12/08/2012  . High cholesterol   . OSA on CPAP 12/07/2012  . Type II diabetes mellitus ([place])   . Bleeding ulcer 2014  . GERD (gastroesophageal reflux disease)   . Anoxic encephalopathy [facility])     Past Surgical History: Past Surgical History  Procedure Laterality Date  . Knee arthroscopy Left ~ 1999  . Esophagogastroduodenoscopy Left 11/26/2012    Procedure: ESOPHAGOGASTRODUODENOSCOPY (EGD);  Surgeon: Wonda Horner, MD;  Location: [facility] ENDOSCOPY;  Service: Endoscopy;  Laterality: Left;  . Cardiovascular stress test  03/16/13    Very Poor Exercise Tolerance; NON DIAGNOSTIC TEST  . Cataract extraction w/ intraocular lens implant Left 03/07/2014    [place] @ [facility]  . Esophagogastroduodenoscopy N/A 10/10/2014    Procedure: ESOPHAGOGASTRODUODENOSCOPY (EGD);  Surgeon: Ladene Artist, MD;  Location: [facility] ENDOSCOPY;  Service: Endoscopy;  Laterality: N/A;    Social History: Social History  Substance Use Topics  . Smoking status: Never Smoker   . Smokeless tobacco: Current User    Types: Snuff  . Alcohol Use: 3.6 oz/week    6 Cans of beer per week   Additional social history: Married and lives with wife Please also refer to relevant sections of EMR.  Family History: No family history on  file.  Allergies and Medications: Allergies  Allergen Reactions  . Lisinopril Other (See Comments)    Pt reports nose bleed.   No current facility-administered medications on file prior to encounter.   Current Outpatient Prescriptions on File Prior to Encounter  Medication Sig Dispense Refill  . citalopram (CELEXA) 20 MG tablet Take 1 tablet (20 mg total) by mouth daily. 30 tablet 3  . gabapentin (NEURONTIN) 100 MG capsule Take 1 capsule (100 mg total) by mouth 3 (three) times daily. 90 capsule 3  . Insulin Glargine (LANTUS SOLOSTAR) 100 UNIT/ML Solostar Pen Inject 20 Units into the skin daily. (Patient taking differently: Inject 20 Units into the skin daily at 10 pm. ) 5 pen prn  . LORazepam (ATIVAN) 0.5 MG tablet Take 1 tablet (0.5 mg total) by mouth every 8 (eight) hours as needed for anxiety or seizure. 90 tablet 0  . metoprolol succinate (TOPROL-XL) 100 MG 24 hr tablet TAKE ONE TABLET BY MOUTH DAILY. TAKE WITH OR IMMEDIATELY FOLLOWING A MEAL (Patient taking differently: Take 100 mg by mouth at bedtime. ) 30 tablet 6  . omeprazole (PRILOSEC) 20 MG capsule Take 1 capsule (20 mg total) by mouth 2 (two)  times daily before a meal. 123456 capsule 6  . folic acid (FOLVITE) 1 MG tablet Take 1 tablet (1 mg total) by mouth daily. (Patient not taking: Reported on 09/12/2015) 30 tablet 0  . lactulose (CHRONULAC) 10 GM/15ML solution Take 15 mLs (10 g total) by mouth daily. (Patient not taking: Reported on 09/12/2015) 240 mL 0  . losartan (COZAAR) 50 MG tablet Take 1 tablet (50 mg total) by mouth daily. (Patient not taking: Reported on 09/12/2015) 30 tablet 1  . traMADol (ULTRAM) 50 MG tablet Take 1 tablet (50 mg total) by mouth every 12 (twelve) hours as needed for severe pain. (Patient not taking: Reported on 09/12/2015) 30 tablet 0    Objective: BP 142/101 mmHg  Pulse 81  Temp(Src) 98.2 F (36.8 C) (Oral)  Resp 20  SpO2 99% Exam: General: alert, awake, well-developed, NAD, cooperative HEET: NCAT,  vision grossly intact, PERRLA, EOMI. MMM, oral mucosa and oropharynx reveal no lesions or exudates. External ear exam normal.  Neck: supple, full ROM Lungs: CTAB, normal respiratory effort, no crackles, and no wheezes. Heart: RRR, no M/R/G.  Abdomen: Bowel sounds normal; abdomen soft and nontender. No masses, organomegaly or hernias noted.  Pulses: DP/PT are full and equal bilaterally.  Extremities: No cyanosis, clubbing, edema Neurologic: Non-focal, A&Ox3, no motor or sensory deficits. Skin: Intact without suspicious lesions or rashes. Warm and dry. Psych: Mood and affect are normal; no evidence of anxiety or depression.  Labs and Imaging: Results for orders placed or performed during the hospital encounter of 09/12/15 (from the past 24 hour(s))  CBG monitoring, ED     Status: Abnormal   Collection Time: 09/12/15  7:43 PM  Result Value Ref Range   Glucose-Capillary 167 (H) 65 - 99 mg/dL  Comprehensive metabolic panel     Status: Abnormal   Collection Time: 09/12/15  8:05 PM  Result Value Ref Range   Sodium 139 135 - 145 mmol/L   Potassium 3.3 (L) 3.5 - 5.1 mmol/L   Chloride 100 (L) 101 - 111 mmol/L   CO2 24 22 - 32 mmol/L   Glucose, Bld 176 (H) 65 - 99 mg/dL   BUN <5 (L) 6 - 20 mg/dL   Creatinine, Ser 0.83 0.61 - 1.24 mg/dL   Calcium 8.9 8.9 - 10.3 mg/dL   Total Protein 6.9 6.5 - 8.1 g/dL   Albumin 3.1 (L) 3.5 - 5.0 g/dL   AST 63 (H) 15 - 41 U/L   ALT 37 17 - 63 U/L   Alkaline Phosphatase 112 38 - 126 U/L   Total Bilirubin 0.4 0.3 - 1.2 mg/dL   GFR calc non Af Amer >60 >60 mL/min   GFR calc Af Amer >60 >60 mL/min   Anion gap 15 5 - 15  Ethanol     Status: Abnormal   Collection Time: 09/12/15  8:05 PM  Result Value Ref Range   Alcohol, Ethyl (B) 349 (HH) <5 mg/dL  CBC with Differential     Status: Abnormal   Collection Time: 09/12/15  8:05 PM  Result Value Ref Range   WBC 5.8 4.0 - 10.5 K/uL   RBC 3.92 (L) 4.22 - 5.81 MIL/uL   Hemoglobin 12.0 (L) 13.0 - 17.0 g/dL   HCT  35.3 (L) 39.0 - 52.0 %   MCV 90.1 78.0 - 100.0 fL   MCH 30.6 26.0 - 34.0 pg   MCHC 34.0 30.0 - 36.0 g/dL   RDW 16.7 (H) 11.5 - 15.5 %   Platelets 148 (L)  150 - 400 K/uL   Neutrophils Relative % 45 %   Neutro Abs 2.6 1.7 - 7.7 K/uL   Lymphocytes Relative 44 %   Lymphs Abs 2.6 0.7 - 4.0 K/uL   Monocytes Relative 8 %   Monocytes Absolute 0.5 0.1 - 1.0 K/uL   Eosinophils Relative 2 %   Eosinophils Absolute 0.1 0.0 - 0.7 K/uL   Basophils Relative 1 %   Basophils Absolute 0.1 0.0 - 0.1 K/uL  CK     Status: None   Collection Time: 09/12/15  8:05 PM  Result Value Ref Range   Total CK 107 49 - 397 U/L  Ammonia     Status: Abnormal   Collection Time: 09/12/15  8:05 PM  Result Value Ref Range   Ammonia 42 (H) 9 - 35 umol/L  I-Stat CG4 Lactic Acid, ED     Status: Abnormal   Collection Time: 09/12/15  8:11 PM  Result Value Ref Range   Lactic Acid, Venous 3.67 (HH) 0.5 - 2.0 mmol/L   Comment NOTIFIED PHYSICIAN   I-Stat CG4 Lactic Acid, ED     Status: Abnormal   Collection Time: 09/12/15 11:08 PM  Result Value Ref Range   Lactic Acid, Venous 2.97 (HH) 0.5 - 2.0 mmol/L   Comment NOTIFIED PHYSICIAN    Ct Head Wo Contrast  09/12/2015  CLINICAL DATA:  Acute onset of confusion. Altered mental status. Initial encounter. EXAM: CT HEAD WITHOUT CONTRAST TECHNIQUE: Contiguous axial images were obtained from the base of the skull through the vertex without intravenous contrast. COMPARISON:  CT of the head performed 03/09/2015 FINDINGS: There is no evidence of acute infarction, mass lesion, or intra- or extra-axial hemorrhage on CT. Mild prominence of ventricles and sulci suggests mild cortical volume loss. The brainstem and fourth ventricle are within normal limits. The basal ganglia are unremarkable in appearance. The cerebral hemispheres demonstrate grossly normal gray-white differentiation. No mass effect or midline shift is seen. There is no evidence of fracture; visualized osseous structures are  unremarkable in appearance. The orbits are within normal limits. There is mild partial opacification of the right mastoid air cells. The paranasal sinuses and left mastoid air cells are well-aerated. No significant soft tissue abnormalities are seen. IMPRESSION: 1. No acute intracranial pathology seen on CT. 2. Mild cortical volume loss suggested. Electronically Signed   By: Garald Balding M.D.   On: 09/12/2015 20:21    Katheren Shams, DO 09/12/2015, 11:24 PM PGY-2, [place] Intern pager: [phone], text pages welcome

## 2015-09-12 NOTE — ED Provider Notes (Signed)
CSN: [id number]     Arrival date & time 09/12/15  1931 History   First MD Initiated Contact with Patient 09/12/15 1936     Chief Complaint  Patient presents with  . Allergic Reaction     (Consider location/radiation/quality/duration/timing/severity/associated sxs/prior Treatment) Patient is a 49 y.o. male presenting with allergic reaction. The history is provided by the patient and the EMS personnel.  Allergic Reaction He apparently woke up from a nap and was confused and combative. EMS reports that he was combative and very confused. On arrival in the ED, the patient is calm and states that he just woke up confused. He had been abstinent from alcohol for the last 3 weeks, but admits to drinking a pint of liquor today. He also had not been taking citalopram for the last month, but started again today. He has no other complaints. He denies chest pain, dyspnea, cough, weakness.  Past Medical History  Diagnosis Date  . Hypertension   . Blood transfusion 2014    "related to bleeding ulcer"  . Venous insufficiency 12/13/2011  . Cardiac arrest ([place]) 12/07/2012    Anoxic encephalopathy  . Anxiety   . Acute renal insufficiency 12/08/2012  . High cholesterol   . OSA on CPAP 12/07/2012  . Type II diabetes mellitus ([place])   . Bleeding ulcer 2014  . GERD (gastroesophageal reflux disease)   . Anoxic encephalopathy [facility])    Past Surgical History  Procedure Laterality Date  . Knee arthroscopy Left ~ 1999  . Esophagogastroduodenoscopy Left 11/26/2012    Procedure: ESOPHAGOGASTRODUODENOSCOPY (EGD);  Surgeon: Wonda Horner, MD;  Location: [facility] ENDOSCOPY;  Service: Endoscopy;  Laterality: Left;  . Cardiovascular stress test  03/16/13    Very Poor Exercise Tolerance; NON DIAGNOSTIC TEST  . Cataract extraction w/ intraocular lens implant Left 03/07/2014    [place] @ [facility]  . Esophagogastroduodenoscopy N/A 10/10/2014    Procedure: ESOPHAGOGASTRODUODENOSCOPY (EGD);  Surgeon: Ladene Artist, MD;   Location: [facility] ENDOSCOPY;  Service: Endoscopy;  Laterality: N/A;   No family history on file. Social History  Substance Use Topics  . Smoking status: Never Smoker   . Smokeless tobacco: Current User    Types: Snuff  . Alcohol Use: 3.6 oz/week    6 Cans of beer per week    Review of Systems  All other systems reviewed and are negative.     Allergies  Lisinopril  Home Medications   Prior to Admission medications   Medication Sig Start Date End Date Taking? Authorizing Provider  citalopram (CELEXA) 20 MG tablet Take 1 tablet (20 mg total) by mouth daily. 05/22/15   Mariel Aloe, MD  folic acid (FOLVITE) 1 MG tablet Take 1 tablet (1 mg total) by mouth daily. 03/12/15   Virginia Crews, MD  gabapentin (NEURONTIN) 100 MG capsule Take 1 capsule (100 mg total) by mouth 3 (three) times daily. 08/04/15   Mariel Aloe, MD  Insulin Glargine (LANTUS SOLOSTAR) 100 UNIT/ML Solostar Pen Inject 20 Units into the skin daily. 07/27/15   Zenia Resides, MD  lactulose (CHRONULAC) 10 GM/15ML solution Take 15 mLs (10 g total) by mouth daily. 03/22/15   Renee A Kuneff, DO  LORazepam (ATIVAN) 0.5 MG tablet Take 1 tablet (0.5 mg total) by mouth every 8 (eight) hours as needed for anxiety or seizure. 08/25/15   Mariel Aloe, MD  losartan (COZAAR) 50 MG tablet Take 1 tablet (50 mg total) by mouth daily. 05/22/15   Deidre Ala  A Nettey, MD  metoprolol succinate (TOPROL-XL) 100 MG 24 hr tablet TAKE ONE TABLET BY MOUTH DAILY. TAKE WITH OR IMMEDIATELY FOLLOWING A MEAL 08/25/15   Mariel Aloe, MD  omeprazole (PRILOSEC) 20 MG capsule Take 1 capsule (20 mg total) by mouth 2 (two) times daily before a meal. 03/12/15   Virginia Crews, MD  traMADol (ULTRAM) 50 MG tablet Take 1 tablet (50 mg total) by mouth every 12 (twelve) hours as needed for severe pain. 03/12/15   Virginia Crews, MD   BP 140/96 mmHg  Pulse 84  Resp 19  SpO2 95% Physical Exam  Nursing note and vitals reviewed.  49 year old male,  resting comfortably and in no acute distress. Vital signs are significant for mild hypertension. Oxygen saturation is 95%, which is normal. Head is normocephalic and atraumatic. PERRLA, EOMI. Oropharynx is clear. Neck is nontender and supple without adenopathy or JVD. Back is nontender and there is no CVA tenderness. Lungs are clear without rales, wheezes, or rhonchi. Chest is nontender. Heart has regular rate and rhythm without murmur. Abdomen is soft, flat, nontender without masses or hepatosplenomegaly and peristalsis is normoactive. Extremities have no cyanosis or edema, full range of motion is present. Skin is warm and dry without rash. Neurologic: Mental status is normal, cranial nerves are intact, there are no motor or sensory deficits. He is oriented to person, place, time. Speech is spontaneous, fluent, and appropriate.  ED Course  Procedures (including critical care time) Labs Review Results for orders placed or performed during the hospital encounter of 09/12/15  Comprehensive metabolic panel  Result Value Ref Range   Sodium 139 135 - 145 mmol/L   Potassium 3.3 (L) 3.5 - 5.1 mmol/L   Chloride 100 (L) 101 - 111 mmol/L   CO2 24 22 - 32 mmol/L   Glucose, Bld 176 (H) 65 - 99 mg/dL   BUN <5 (L) 6 - 20 mg/dL   Creatinine, Ser 0.83 0.61 - 1.24 mg/dL   Calcium 8.9 8.9 - 10.3 mg/dL   Total Protein 6.9 6.5 - 8.1 g/dL   Albumin 3.1 (L) 3.5 - 5.0 g/dL   AST 63 (H) 15 - 41 U/L   ALT 37 17 - 63 U/L   Alkaline Phosphatase 112 38 - 126 U/L   Total Bilirubin 0.4 0.3 - 1.2 mg/dL   GFR calc non Af Amer >60 >60 mL/min   GFR calc Af Amer >60 >60 mL/min   Anion gap 15 5 - 15  Ethanol  Result Value Ref Range   Alcohol, Ethyl (B) 349 (HH) <5 mg/dL  CBC with Differential  Result Value Ref Range   WBC 5.8 4.0 - 10.5 K/uL   RBC 3.92 (L) 4.22 - 5.81 MIL/uL   Hemoglobin 12.0 (L) 13.0 - 17.0 g/dL   HCT 35.3 (L) 39.0 - 52.0 %   MCV 90.1 78.0 - 100.0 fL   MCH 30.6 26.0 - 34.0 pg   MCHC 34.0  30.0 - 36.0 g/dL   RDW 16.7 (H) 11.5 - 15.5 %   Platelets 148 (L) 150 - 400 K/uL   Neutrophils Relative % 45 %   Neutro Abs 2.6 1.7 - 7.7 K/uL   Lymphocytes Relative 44 %   Lymphs Abs 2.6 0.7 - 4.0 K/uL   Monocytes Relative 8 %   Monocytes Absolute 0.5 0.1 - 1.0 K/uL   Eosinophils Relative 2 %   Eosinophils Absolute 0.1 0.0 - 0.7 K/uL   Basophils Relative 1 %  Basophils Absolute 0.1 0.0 - 0.1 K/uL  CK  Result Value Ref Range   Total CK 107 49 - 397 U/L  Ammonia  Result Value Ref Range   Ammonia 42 (H) 9 - 35 umol/L  I-Stat CG4 Lactic Acid, ED  Result Value Ref Range   Lactic Acid, Venous 3.67 (HH) 0.5 - 2.0 mmol/L   Comment NOTIFIED PHYSICIAN   CBG monitoring, ED  Result Value Ref Range   Glucose-Capillary 167 (H) 65 - 99 mg/dL  I-Stat CG4 Lactic Acid, ED  Result Value Ref Range   Lactic Acid, Venous 2.97 (HH) 0.5 - 2.0 mmol/L   Comment NOTIFIED PHYSICIAN    Imaging Review Ct Head Wo Contrast  09/12/2015  CLINICAL DATA:  Acute onset of confusion. Altered mental status. Initial encounter. EXAM: CT HEAD WITHOUT CONTRAST TECHNIQUE: Contiguous axial images were obtained from the base of the skull through the vertex without intravenous contrast. COMPARISON:  CT of the head performed 03/09/2015 FINDINGS: There is no evidence of acute infarction, mass lesion, or intra- or extra-axial hemorrhage on CT. Mild prominence of ventricles and sulci suggests mild cortical volume loss. The brainstem and fourth ventricle are within normal limits. The basal ganglia are unremarkable in appearance. The cerebral hemispheres demonstrate grossly normal gray-white differentiation. No mass effect or midline shift is seen. There is no evidence of fracture; visualized osseous structures are unremarkable in appearance. The orbits are within normal limits. There is mild partial opacification of the right mastoid air cells. The paranasal sinuses and left mastoid air cells are well-aerated. No significant soft  tissue abnormalities are seen. IMPRESSION: 1. No acute intracranial pathology seen on CT. 2. Mild cortical volume loss suggested. Electronically Signed   By: Garald Balding M.D.   On: 09/12/2015 20:21   I have personally reviewed and evaluated these images and lab results as part of my medical decision-making.   EKG Interpretation  Date/Time:  Tuesday September 12 2015 19:41:14 EST Ventricular Rate:  84 PR Interval:  175 QRS Duration: 104 QT Interval:  389 QTC Calculation: 460 R Axis:   17 Text Interpretation:  Sinus rhythm Probable left atrial enlargement When compared with ECG of 03/09/2015, No significant change was found Confirmed by [facility]  MD, Christphor Groft ([id number]) on 09/12/2015 7:53:00 PM       MDM   Final diagnoses:  Altered mental status, unspecified altered mental status type  Alcohol intoxication, with unspecified complication (HCC)  Elevated lactic acid level    Episode of confusion and combativeness. Old records are reviewed and he does have history of alcohol abuse and also has a history of cardiac arrest with some degree of anoxic encephalopathy but apparently returned to baseline. He will be observed in the ED and screening labs obtained. He will be sent for CT of the head. Also, lactic acid level and CK will be checked to evaluate for possible recent seizure which could account for mental status changes.  CK has come back normal lactic acid was elevated. He was observed in the ED with no further alterations in mentation. Lactic acid was repeated and is only decreased slightly. He is given some IV fluids and he will be admitted for observation to make sure that lactic acid level normalizes. I have also talked with family members who described that he was shaking all over but not unconscious. I'm still suspicious that he may have had some kind of seizure-like activity and may need to consider neurologic consultation for, at least, obtaining outpatient EEG. Case  is discussed with Dr.  Gerarda Fraction of family practice service who agrees to admit the patient under observation status.  Delora Fuel, MD [id number]

## 2015-09-12 NOTE — ED Notes (Signed)
Pt given meal and beverage

## 2015-09-12 NOTE — ED Notes (Addendum)
Per EMS- pt here for evaluation of confusion after being off of his citalopram for a month and restarting it tonight after consuming alcohol. Pt was confused after mixing the two. Pt was unaware of what happened upon EMS arrival. Pt is more alert upon arrival to ED. CBG 170. BP 153/110 HR 84. GCS 15. RR 24. 18G PIV LAC.

## 2015-09-13 ENCOUNTER — Encounter (HOSPITAL_COMMUNITY): Payer: Self-pay | Admitting: *Deleted

## 2015-09-13 DIAGNOSIS — F10129 Alcohol abuse with intoxication, unspecified: Secondary | ICD-10-CM

## 2015-09-13 DIAGNOSIS — E872 Acidosis, unspecified: Secondary | ICD-10-CM | POA: Diagnosis present

## 2015-09-13 DIAGNOSIS — R4182 Altered mental status, unspecified: Secondary | ICD-10-CM

## 2015-09-13 LAB — BASIC METABOLIC PANEL
ANION GAP: 12 (ref 5–15)
BUN: <5 mg/dL — ABNORMAL LOW (ref 6–20)
CALCIUM: 8.6 mg/dL — AB (ref 8.9–10.3)
CO2: 26 mmol/L (ref 22–32)
Chloride: 102 mmol/L (ref 101–111)
Creatinine, Ser: 0.82 mg/dL (ref 0.61–1.24)
GLUCOSE: 59 mg/dL — AB (ref 65–99)
Potassium: 3.2 mmol/L — ABNORMAL LOW (ref 3.5–5.1)
SODIUM: 140 mmol/L (ref 135–145)

## 2015-09-13 LAB — GLUCOSE, CAPILLARY
GLUCOSE-CAPILLARY: 29 mg/dL — AB (ref 65–99)
GLUCOSE-CAPILLARY: 120 mg/dL — AB (ref 65–99)
Glucose-Capillary: 200 mg/dL — ABNORMAL HIGH (ref 65–99)
Glucose-Capillary: 133 mg/dL — ABNORMAL HIGH (ref 65–99)
Glucose-Capillary: 90 mg/dL (ref 65–99)

## 2015-09-13 LAB — LACTIC ACID, PLASMA: LACTIC ACID, VENOUS: 1.8 mmol/L (ref 0.5–2.0)

## 2015-09-13 LAB — MRSA PCR SCREENING: MRSA BY PCR: NEGATIVE

## 2015-09-13 MED ORDER — ENSURE ENLIVE PO LIQD
237.0000 mL | Freq: Two times a day (BID) | ORAL | Status: DC
  Administered 2015-09-13: 237 mL via ORAL

## 2015-09-13 MED ORDER — PANTOPRAZOLE SODIUM 40 MG PO TBEC
40.0000 mg | DELAYED_RELEASE_TABLET | Freq: Every day | ORAL | Status: DC
Start: 2015-09-13 — End: 2015-09-13
  Administered 2015-09-13: 40 mg via ORAL
  Filled 2015-09-13: qty 1

## 2015-09-13 MED ORDER — INSULIN GLARGINE 100 UNIT/ML ~~LOC~~ SOLN
10.0000 [IU] | Freq: Every day | SUBCUTANEOUS | Status: DC
  Administered 2015-09-13: 10 [IU] via SUBCUTANEOUS
  Filled 2015-09-13 (×2): qty 0.1

## 2015-09-13 MED ORDER — LORAZEPAM 2 MG/ML IJ SOLN: 1.0000 mg | Freq: Four times a day (QID) | INTRAMUSCULAR | Status: DC | PRN

## 2015-09-13 MED ORDER — LORAZEPAM 1 MG PO TABS: 1.0000 mg | ORAL_TABLET | Freq: Four times a day (QID) | ORAL | Status: DC | PRN

## 2015-09-13 MED ORDER — THIAMINE HCL 100 MG/ML IJ SOLN: 100.0000 mg | Freq: Every day | INTRAMUSCULAR | Status: DC

## 2015-09-13 MED ORDER — ACETAMINOPHEN 325 MG PO TABS: 650.0000 mg | ORAL_TABLET | Freq: Four times a day (QID) | ORAL | Status: DC | PRN

## 2015-09-13 MED ORDER — ACETAMINOPHEN 650 MG RE SUPP: 650.0000 mg | Freq: Four times a day (QID) | RECTAL | Status: DC | PRN

## 2015-09-13 MED ORDER — ADULT MULTIVITAMIN W/MINERALS CH
1.0000 | ORAL_TABLET | Freq: Every day | ORAL | Status: DC
  Administered 2015-09-13: 1 via ORAL
  Filled 2015-09-13: qty 1

## 2015-09-13 MED ORDER — FOLIC ACID 1 MG PO TABS
1.0000 mg | ORAL_TABLET | Freq: Every day | ORAL | Status: DC
  Administered 2015-09-13: 1 mg via ORAL
  Filled 2015-09-13: qty 1

## 2015-09-13 MED ORDER — VITAMIN B-1 100 MG PO TABS
100.0000 mg | ORAL_TABLET | Freq: Every day | ORAL | Status: DC
  Administered 2015-09-13: 100 mg via ORAL
  Filled 2015-09-13: qty 1

## 2015-09-13 MED ORDER — POTASSIUM CHLORIDE CRYS ER 20 MEQ PO TBCR
40.0000 meq | EXTENDED_RELEASE_TABLET | Freq: Once | ORAL | Status: AC
  Administered 2015-09-13: 40 meq via ORAL
  Filled 2015-09-13: qty 2

## 2015-09-13 MED ORDER — SODIUM CHLORIDE 0.9 % IV SOLN: 1000.0000 mL | INTRAVENOUS | Status: AC

## 2015-09-13 MED ORDER — INSULIN ASPART 100 UNIT/ML ~~LOC~~ SOLN: 0.0000 [IU] | Freq: Three times a day (TID) | SUBCUTANEOUS | Status: DC

## 2015-09-13 MED ORDER — INFLUENZA VAC SPLIT QUAD 0.5 ML IM SUSY: 0.5000 mL | PREFILLED_SYRINGE | INTRAMUSCULAR | Status: DC

## 2015-09-13 MED ORDER — DEXTROSE 50 % IV SOLN
INTRAVENOUS | Status: AC
  Administered 2015-09-13: 50 mL
  Filled 2015-09-13: qty 50

## 2015-09-13 MED ORDER — HEPARIN SODIUM (PORCINE) 5000 UNIT/ML IJ SOLN
5000.0000 [IU] | Freq: Three times a day (TID) | INTRAMUSCULAR | Status: DC
  Administered 2015-09-13: 5000 [IU] via SUBCUTANEOUS
  Filled 2015-09-13: qty 1

## 2015-09-13 MED ORDER — METOPROLOL SUCCINATE ER 100 MG PO TB24
100.0000 mg | ORAL_TABLET | Freq: Every day | ORAL | Status: DC
  Administered 2015-09-13: 100 mg via ORAL
  Filled 2015-09-13: qty 1

## 2015-09-13 NOTE — Discharge Summary (Signed)
[facility] Discharge Summary  Patient name: Jeffrey [name] Medical record number: [id number] Date of birth: 1966-09-11 Age: 49 y.o. Gender: male Date of Admission: 09/12/2015  Date of Discharge: 09/13/2015 Admitting Physician: Lupita Dawn, MD  Primary Care Provider: Cordelia Poche, MD Consultants: None   Indication for Hospitalization: AMS and Elevated Lactic Acid   Discharge Diagnoses/Problem List:  Patient Active Problem List   Diagnosis Date Noted  . Lactic acidosis 09/13/2015  . Altered mental status 09/12/2015  . Diabetic polyneuropathy associated with type 2 diabetes mellitus ([place]) 08/04/2015  . Hepatic cirrhosis ([place]) 03/22/2015  . Ataxia 03/20/2015  . Aphasia   . Elevated liver enzymes   . Toxic metabolic encephalopathy 123456  . Dysarthria 03/09/2015  . AKI (acute kidney injury) ([place])   . Alcohol intoxication ([place])   . Abdominal pain 11/17/2014  . Acute blood loss anemia 11/17/2014  . Hematemesis with nausea   . Alcohol abuse   . Hyperkalemia   . Tachycardia   . Hematemesis 11/16/2014  . Gastrointestinal hemorrhage with melena 10/10/2014  . Erosive esophagitis 10/10/2014  . HLD (hyperlipidemia)   . Anemia due to other cause   . Hyperglycemia   . Syncope and collapse   . Dehydration 05/18/2014  . OSA on CPAP 12/28/2012  . Pulmonary edema 12/08/2012  . Hypoglycemia 12/07/2012  . Cardiac arrest ([place]) 12/07/2012  . Obesity 12/07/2012  . Anoxic brain injury ([place]) 12/07/2012  . GI bleed 11/26/2012  . Venous insufficiency 12/13/2011  . INSOMNIA, CHRONIC 02/14/2009  . 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 ALCOHOL ABUSE, HX OF 11/10/2007  . ERECTILE DYSFUNCTION 04/14/2007  . DM type 2 goal A1C below 7.5 12/04/2006  . HYPERCHOLESTEROLEMIA 12/04/2006  . OBESITY, NOS 12/04/2006  . Anxiety state 12/04/2006  . HYPERTENSION, BENIGN SYSTEMIC 12/04/2006  . ARTHRITIS 12/04/2006    Disposition: Home    Discharge Condition: Improved   Discharge Exam:  General: alert, awake, well-developed, NAD, cooperative HEET: PERRLA, EOMI. MMM Neck: supple, full ROM Lungs: CTAB, normal respiratory effort, no crackles, and no wheezes. CV: RRR, no M/R/G. Distal pulses intact  Abdomen: +BS; abdomen soft and nontender.  Extremities: No LE edema  Neurologic: A&Ox3. No gross deficits.  Skin: Intact without suspicious lesions or rashes. Warm and dry. Psych: Mood and affect are normal; no evidence of anxiety or depression.  Brief Hospital Course:  Jeffrey [name] is 49 y.o. male with PMH significant for EtOH abuse, cardiac arrest, HTN, T2DM, and OSA who was brought to the ED by his wife for confusion. Patient had just restarted taking his Celexa the afternoon of presentation and around the same time he admitted to drinking two 16 oz beers. While the patient did not remember anything after 13:00 from that day, his AMS had improved by time of admission. Ethanol level found to 349.   Lactic acid was 3.67 at admission with no signs of infection/sepsis or systemic hypoperfusion. Thought to be type B lactic acidosis from alcoholism. IVF were continued and lactic acid trended down to 1.8. Counseled patient regarding reducing EtOh use and to not mix with SSRI. He was stable for discharge with follow up.   Issues for Follow Up:  1. Alcohol use and further counseling against mixing EtOH and SSRIs  2. Diastolic blood pressures mildly elevated but patient had missed dose of Toprol. F/u on blood pressure reading.   Significant Procedures: None   Significant Labs and Imaging:   Recent Labs Lab 09/12/15  2005  WBC 5.8  HGB 12.0*  HCT 35.3*  PLT 148*    Recent Labs Lab 09/12/15 2005 09/13/15 0602  NA 139 140  K 3.3* 3.2*  CL 100* 102  CO2 24 26  GLUCOSE 176* 59*  BUN <5* <5*  CREATININE 0.83 0.82  CALCIUM 8.9 8.6*  ALKPHOS 112  --   AST 63*  --   ALT 37  --   ALBUMIN 3.1*  --      Results/Tests  Pending at Time of Discharge: None   Discharge Medications:    Medication List    TAKE these medications        citalopram 20 MG tablet  Commonly known as:  CELEXA  Take 1 tablet (20 mg total) by mouth daily.     folic acid 1 MG tablet  Commonly known as:  FOLVITE  Take 1 tablet (1 mg total) by mouth daily.     gabapentin 100 MG capsule  Commonly known as:  NEURONTIN  Take 1 capsule (100 mg total) by mouth 3 (three) times daily.     Insulin Glargine 100 UNIT/ML Solostar Pen  Commonly known as:  LANTUS SOLOSTAR  Inject 20 Units into the skin daily.     lactulose 10 GM/15ML solution  Commonly known as:  CHRONULAC  Take 15 mLs (10 g total) by mouth daily.     LORazepam 0.5 MG tablet  Commonly known as:  ATIVAN  Take 1 tablet (0.5 mg total) by mouth every 8 (eight) hours as needed for anxiety or seizure.     losartan 50 MG tablet  Commonly known as:  COZAAR  Take 1 tablet (50 mg total) by mouth daily.     metoprolol succinate 100 MG 24 hr tablet  Commonly known as:  TOPROL-XL  TAKE ONE TABLET BY MOUTH DAILY. TAKE WITH OR IMMEDIATELY FOLLOWING A MEAL     omeprazole 20 MG capsule  Commonly known as:  PRILOSEC  Take 1 capsule (20 mg total) by mouth 2 (two) times daily before a meal.     traMADol 50 MG tablet  Commonly known as:  ULTRAM  Take 1 tablet (50 mg total) by mouth every 12 (twelve) hours as needed for severe pain.        Discharge Instructions: Please refer to Patient Instructions section of EMR for full details.  Patient was counseled important signs and symptoms that should prompt return to medical care, changes in medications, dietary instructions, activity restrictions, and follow up appointments.   Follow-Up Appointments: Follow-up Information    Follow up with [facility].   Specialty:  Family Medicine   Contact information:   [address] [id number] [place] [id number] [place], DO 09/13/2015, 1:46 PM PGY-1, [place]

## 2015-09-13 NOTE — Progress Notes (Signed)
Hypoglycemic Event  CBG: 29  Treatment: D50 IV 50 mL  Symptoms: Sweaty, shaky  Follow-up CBG: Time: 0815 CBG Result:133  Possible Reasons for Event: Inadequate meal intake  Comments/MD notified:Yes    Andree Elk, Linton Rump

## 2015-09-13 NOTE — Progress Notes (Signed)
Initial Nutrition Assessment  DOCUMENTATION CODES:   Obesity unspecified  INTERVENTION:  Continue Ensure Enlive po BID, each supplement provides 350 kcal and 20 grams of protein.  Encourage adequate PO intake.   NUTRITION DIAGNOSIS:   Inadequate oral intake related to poor appetite as evidenced by per patient/family report.  GOAL:   Patient will meet greater than or equal to 90% of their needs  MONITOR:   PO intake, Supplement acceptance, Weight trends, Labs, I & O's  REASON FOR ASSESSMENT:   Malnutrition Screening Tool    ASSESSMENT:   49 y.o. male presenting with altered mental status. PMH is significant for alcohol abuse, cardiac arrest, HTN, DM II, and OSA.  Pt reports having a varied appetite. Meal completion has been 75%. Pt reports he has still been consuming at least 3 meals a day. Per Epic weight records, pt with a 18% weight loss in 4 months. Pt currently has Ensure ordered and has been consuming them. RD to continue with current orders.   Pt with no observed significant fat or muscle mass loss.   Labs and medications reviewed.   Diet Order:  Diet Heart Room service appropriate?: Yes; Fluid consistency:: Thin  Skin:  Reviewed, no issues  Last BM:  12/6  Height:   Ht Readings from Last 1 Encounters:  09/13/15 5\' 10"  (1.778 m)    Weight:   Wt Readings from Last 1 Encounters:  09/13/15 219 lb 2.2 oz (99.4 kg)    Ideal Body Weight:  75.45 kg  BMI:  Body mass index is 31.44 kg/(m^2).  Estimated Nutritional Needs:   Kcal:  2000-2200  Protein:  100-115 grams  Fluid:  2- 2.2 L/day  EDUCATION NEEDS:   No education needs identified at this time  Corrin Parker, MS, RD, LDN Pager # [phone] After hours/ weekend pager # [phone]

## 2015-09-13 NOTE — Discharge Instructions (Signed)
Don't drink while taking your Celexa, this can be can be very dangerous. It was a pleasure taking care of you.

## 2015-09-18 ENCOUNTER — Inpatient Hospital Stay: Payer: Self-pay | Admitting: Family Medicine

## 2015-09-22 ENCOUNTER — Inpatient Hospital Stay: Payer: Self-pay | Admitting: Family Medicine

## 2015-10-15 ENCOUNTER — Inpatient Hospital Stay (HOSPITAL_COMMUNITY)
Admission: EM | Admit: 2015-10-15 | Discharge: 2015-10-18 | DRG: 638 | Disposition: A | Discharge status: Home / Self Care | Payer: Self-pay | Attending: Family Medicine | Admitting: Family Medicine

## 2015-10-15 ENCOUNTER — Encounter (HOSPITAL_COMMUNITY): Payer: Self-pay | Admitting: Emergency Medicine

## 2015-10-15 DIAGNOSIS — Z9842 Cataract extraction status, left eye: Secondary | ICD-10-CM

## 2015-10-15 DIAGNOSIS — K7030 Alcoholic cirrhosis of liver without ascites: Secondary | ICD-10-CM

## 2015-10-15 DIAGNOSIS — Z8674 Personal history of sudden cardiac arrest: Secondary | ICD-10-CM

## 2015-10-15 DIAGNOSIS — Z794 Long term (current) use of insulin: Secondary | ICD-10-CM

## 2015-10-15 DIAGNOSIS — K219 Gastro-esophageal reflux disease without esophagitis: Secondary | ICD-10-CM | POA: Diagnosis present

## 2015-10-15 DIAGNOSIS — Z79899 Other long term (current) drug therapy: Secondary | ICD-10-CM

## 2015-10-15 DIAGNOSIS — Z888 Allergy status to other drugs, medicaments and biological substances status: Secondary | ICD-10-CM

## 2015-10-15 DIAGNOSIS — Z9114 Patient's other noncompliance with medication regimen: Secondary | ICD-10-CM

## 2015-10-15 DIAGNOSIS — E785 Hyperlipidemia, unspecified: Secondary | ICD-10-CM | POA: Diagnosis present

## 2015-10-15 DIAGNOSIS — E1310 Other specified diabetes mellitus with ketoacidosis without coma: Principal | ICD-10-CM | POA: Diagnosis present

## 2015-10-15 DIAGNOSIS — E669 Obesity, unspecified: Secondary | ICD-10-CM | POA: Diagnosis present

## 2015-10-15 DIAGNOSIS — E1110 Type 2 diabetes mellitus with ketoacidosis without coma: Secondary | ICD-10-CM | POA: Diagnosis present

## 2015-10-15 DIAGNOSIS — F411 Generalized anxiety disorder: Secondary | ICD-10-CM | POA: Diagnosis present

## 2015-10-15 DIAGNOSIS — F17290 Nicotine dependence, other tobacco product, uncomplicated: Secondary | ICD-10-CM | POA: Diagnosis present

## 2015-10-15 DIAGNOSIS — F1020 Alcohol dependence, uncomplicated: Secondary | ICD-10-CM | POA: Diagnosis present

## 2015-10-15 DIAGNOSIS — G4733 Obstructive sleep apnea (adult) (pediatric): Secondary | ICD-10-CM | POA: Diagnosis present

## 2015-10-15 DIAGNOSIS — E860 Dehydration: Secondary | ICD-10-CM | POA: Diagnosis present

## 2015-10-15 DIAGNOSIS — I10 Essential (primary) hypertension: Secondary | ICD-10-CM | POA: Insufficient documentation

## 2015-10-15 DIAGNOSIS — N179 Acute kidney failure, unspecified: Secondary | ICD-10-CM | POA: Diagnosis present

## 2015-10-15 DIAGNOSIS — F419 Anxiety disorder, unspecified: Secondary | ICD-10-CM | POA: Diagnosis present

## 2015-10-15 DIAGNOSIS — R809 Proteinuria, unspecified: Secondary | ICD-10-CM | POA: Diagnosis present

## 2015-10-15 DIAGNOSIS — E875 Hyperkalemia: Secondary | ICD-10-CM | POA: Diagnosis present

## 2015-10-15 LAB — CBG MONITORING, ED
GLUCOSE-CAPILLARY: 333 mg/dL — AB (ref 65–99)
GLUCOSE-CAPILLARY: 243 mg/dL — AB (ref 65–99)
GLUCOSE-CAPILLARY: 103 mg/dL — AB (ref 65–99)
GLUCOSE-CAPILLARY: 106 mg/dL — AB (ref 65–99)
Glucose-Capillary: 330 mg/dL — ABNORMAL HIGH (ref 65–99)
Glucose-Capillary: 218 mg/dL — ABNORMAL HIGH (ref 65–99)
Glucose-Capillary: 183 mg/dL — ABNORMAL HIGH (ref 65–99)
Glucose-Capillary: 134 mg/dL — ABNORMAL HIGH (ref 65–99)

## 2015-10-15 LAB — CBC
HEMATOCRIT: 38.1 % — AB (ref 39.0–52.0)
HEMOGLOBIN: 12.6 g/dL — AB (ref 13.0–17.0)
MCH: 31.9 pg (ref 26.0–34.0)
MCHC: 33.1 g/dL (ref 30.0–36.0)
MCV: 96.5 fL (ref 78.0–100.0)
Platelets: 218 10*3/uL (ref 150–400)
RBC: 3.95 MIL/uL — ABNORMAL LOW (ref 4.22–5.81)
RDW: 17.6 % — AB (ref 11.5–15.5)
WBC: 7.4 10*3/uL (ref 4.0–10.5)

## 2015-10-15 LAB — LACTIC ACID, PLASMA
LACTIC ACID, VENOUS: 5.1 mmol/L — AB (ref 0.5–2.0)
Lactic Acid, Venous: 4.3 mmol/L (ref 0.5–2.0)

## 2015-10-15 LAB — BASIC METABOLIC PANEL
ANION GAP: 27 — AB (ref 5–15)
BUN: 15 mg/dL (ref 6–20)
BUN: 16 mg/dL (ref 6–20)
CALCIUM: 9 mg/dL (ref 8.9–10.3)
CALCIUM: 8.5 mg/dL — AB (ref 8.9–10.3)
CHLORIDE: 101 mmol/L (ref 101–111)
CO2: 15 mmol/L — ABNORMAL LOW (ref 22–32)
CO2: 16 mmol/L — AB (ref 22–32)
CREATININE: 1.38 mg/dL — AB (ref 0.61–1.24)
Chloride: 94 mmol/L — ABNORMAL LOW (ref 101–111)
Creatinine, Ser: 1.53 mg/dL — ABNORMAL HIGH (ref 0.61–1.24)
GFR calc non Af Amer: 58 mL/min — ABNORMAL LOW (ref >60)
GFR, EST AFRICAN AMERICAN: 59 mL/min — AB (ref >60)
GFR, EST NON AFRICAN AMERICAN: 51 mL/min — AB (ref >60)
Glucose, Bld: 378 mg/dL — ABNORMAL HIGH (ref 65–99)
Glucose, Bld: 179 mg/dL — ABNORMAL HIGH (ref 65–99)
POTASSIUM: 5.6 mmol/L — AB (ref 3.5–5.1)
Potassium: 6.3 mmol/L (ref 3.5–5.1)
SODIUM: 144 mmol/L (ref 135–145)
Sodium: 141 mmol/L (ref 135–145)

## 2015-10-15 LAB — URINE MICROSCOPIC-ADD ON: Bacteria, UA: NONE SEEN

## 2015-10-15 LAB — BLOOD GAS, VENOUS
ACID-BASE DEFICIT: 12 mmol/L — AB (ref 0.0–2.0)
Bicarbonate: 15 mEq/L — ABNORMAL LOW (ref 20.0–24.0)
O2 SAT: 66.4 %
PATIENT TEMPERATURE: 98.6
PH VEN: 7.21 — AB (ref 7.250–7.300)
PO2 VEN: 47.1 mmHg — AB (ref 30.0–45.0)
TCO2: 14.2 mmol/L (ref 0–100)
pCO2, Ven: 38.8 mmHg — ABNORMAL LOW (ref 45.0–50.0)

## 2015-10-15 LAB — URINALYSIS, ROUTINE W REFLEX MICROSCOPIC
BILIRUBIN URINE: NEGATIVE
Glucose, UA: >1000 mg/dL — AB
KETONES UR: 40 mg/dL — AB
LEUKOCYTES UA: NEGATIVE
NITRITE: NEGATIVE
PH: 5.5 (ref 5.0–8.0)
Protein, ur: >300 mg/dL — AB
Specific Gravity, Urine: 1.024 (ref 1.005–1.030)

## 2015-10-15 MED ORDER — SODIUM CHLORIDE 0.9 % IV BOLUS (SEPSIS)
1000.0000 mL | Freq: Once | INTRAVENOUS | Status: AC
  Administered 2015-10-15: 1000 mL via INTRAVENOUS

## 2015-10-15 MED ORDER — DEXTROSE-NACL 5-0.45 % IV SOLN
INTRAVENOUS | Status: DC
  Administered 2015-10-15: 22:00:00 via INTRAVENOUS

## 2015-10-15 MED ORDER — SODIUM CHLORIDE 0.9 % IV SOLN
INTRAVENOUS | Status: DC
  Administered 2015-10-15: 2.7 [IU]/h via INTRAVENOUS
  Filled 2015-10-15: qty 2.5

## 2015-10-15 MED ORDER — ONDANSETRON HCL 4 MG/2ML IJ SOLN
4.0000 mg | Freq: Once | INTRAMUSCULAR | Status: AC
  Administered 2015-10-15: 4 mg via INTRAVENOUS
  Filled 2015-10-15: qty 2

## 2015-10-15 MED ORDER — SODIUM CHLORIDE 0.9 % IV SOLN: INTRAVENOUS | Status: DC

## 2015-10-15 NOTE — ED Provider Notes (Signed)
CSN: [id number]     Arrival date & time 10/15/15  1444 History   First MD Initiated Contact with Patient 10/15/15 1458     Chief Complaint  Patient presents with  . Hyperglycemia     (Consider location/radiation/quality/duration/timing/severity/associated sxs/prior Treatment) HPI   Patient is a 50 year old male past medical history of type 2 diabetes, hypertension and today who presents to the ED with complaint of hyperglycemia. Patient states he ran out of his Lantus 5 days ago and states he's been trying to use his home NovoLog instead states his blood pressure has been in the 400s. Endorses associated nausea and vomiting. Denies fever, chills, headache, sore throat, cough, SOB, CP, abdominal pain, diarrhea, urinary sxs, numbness, tingling, weakness.   Past Medical History  Diagnosis Date  . Hypertension   . Blood transfusion 2014    "related to bleeding ulcer"  . Venous insufficiency 12/13/2011  . Cardiac arrest ([place]) 12/07/2012    Anoxic encephalopathy  . Anxiety   . Acute renal insufficiency 12/08/2012  . High cholesterol   . OSA on CPAP 12/07/2012  . Type II diabetes mellitus ([place])   . Bleeding ulcer 2014  . GERD (gastroesophageal reflux disease)   . Anoxic encephalopathy [facility])    Past Surgical History  Procedure Laterality Date  . Knee arthroscopy Left ~ 1999  . Esophagogastroduodenoscopy Left 11/26/2012    Procedure: ESOPHAGOGASTRODUODENOSCOPY (EGD);  Surgeon: Wonda Horner, MD;  Location[facility] ENDOSCOPY;  Service: Endoscopy;  Laterality: Left;  . Cardiovascular stress test  03/16/13    Very Poor Exercise Tolerance; NON DIAGNOSTIC TEST  . Cataract extraction w/ intraocular lens implant Left 03/07/2014    [place] @ [facility]  . Esophagogastroduodenoscopy N/A 10/10/2014    Procedure: ESOPHAGOGASTRODUODENOSCOPY (EGD);  Surgeon: Ladene Artist, MD;  Location: [facility] ENDOSCOPY;  Service: Endoscopy;  Laterality: N/A;   History reviewed. No pertinent family history. Social History   Substance Use Topics  . Smoking status: Never Smoker   . Smokeless tobacco: Current User    Types: Snuff  . Alcohol Use: 3.6 oz/week    6 Cans of beer per week    Review of Systems  Gastrointestinal: Positive for nausea and vomiting.  All other systems reviewed and are negative.     Allergies  Lisinopril  Home Medications   Prior to Admission medications   Medication Sig Start Date End Date Taking? Authorizing Provider  aspirin EC 81 MG tablet Take 81 mg by mouth daily.   Yes Historical Provider, MD  citalopram (CELEXA) 20 MG tablet Take 1 tablet (20 mg total) by mouth daily. 05/22/15  Yes Mariel Aloe, MD  folic acid (FOLVITE) 1 MG tablet Take 1 tablet (1 mg total) by mouth daily. 03/12/15  Yes Virginia Crews, MD  gabapentin (NEURONTIN) 100 MG capsule Take 1 capsule (100 mg total) by mouth 3 (three) times daily. 08/04/15  Yes Mariel Aloe, MD  Insulin Glargine (LANTUS SOLOSTAR) 100 UNIT/ML Solostar Pen Inject 20 Units into the skin daily. Patient taking differently: Inject 20 Units into the skin daily at 10 pm.  07/27/15  Yes Zenia Resides, MD  LORazepam (ATIVAN) 0.5 MG tablet Take 1 tablet (0.5 mg total) by mouth every 8 (eight) hours as needed for anxiety or seizure. 08/25/15  Yes Mariel Aloe, MD  losartan (COZAAR) 50 MG tablet Take 1 tablet (50 mg total) by mouth daily. 05/22/15  Yes Mariel Aloe, MD  metoprolol succinate (TOPROL-XL) 100 MG 24 hr  tablet TAKE ONE TABLET BY MOUTH DAILY. TAKE WITH OR IMMEDIATELY FOLLOWING A MEAL Patient taking differently: Take 100 mg by mouth at bedtime.  08/25/15  Yes Mariel Aloe, MD  Multiple Vitamin (MULTIVITAMIN WITH MINERALS) TABS tablet Take 1 tablet by mouth daily.   Yes Historical Provider, MD  omeprazole (PRILOSEC) 20 MG capsule Take 1 capsule (20 mg total) by mouth 2 (two) times daily before a meal. 03/12/15  Yes Virginia Crews, MD  polyvinyl alcohol (LIQUIFILM TEARS) 1.4 % ophthalmic solution Place 1 drop into both  eyes every 4 (four) hours as needed for dry eyes.   Yes Historical Provider, MD  lactulose (CHRONULAC) 10 GM/15ML solution Take 15 mLs (10 g total) by mouth daily. Patient not taking: Reported on 09/12/2015 03/22/15   Renee A Kuneff, DO  traMADol (ULTRAM) 50 MG tablet Take 1 tablet (50 mg total) by mouth every 12 (twelve) hours as needed for severe pain. Patient not taking: Reported on 09/12/2015 03/12/15   Virginia Crews, MD   BP 136/88 mmHg  Pulse 106  Temp(Src) 98.4 F (36.9 C) (Oral)  Resp 18  SpO2 97% Physical Exam  Constitutional: He is oriented to person, place, and time. He appears well-developed and well-nourished. No distress.  HENT:  Head: Normocephalic and atraumatic.  Mouth/Throat: Uvula is midline and oropharynx is clear and moist. Mucous membranes are dry. No oropharyngeal exudate.  Eyes: Conjunctivae and EOM are normal. Pupils are equal, round, and reactive to light. Right eye exhibits no discharge. Left eye exhibits no discharge. No scleral icterus.  Neck: Normal range of motion. Neck supple.  Cardiovascular: Normal rate, regular rhythm, normal heart sounds and intact distal pulses.   Pulmonary/Chest: Effort normal and breath sounds normal. No respiratory distress. He has no wheezes. He has no rales. He exhibits no tenderness.  Abdominal: Soft. Bowel sounds are normal. He exhibits no distension and no mass. There is no tenderness. There is no rebound and no guarding.  Musculoskeletal: Normal range of motion. He exhibits no edema.  Lymphadenopathy:    He has no cervical adenopathy.  Neurological: He is alert and oriented to person, place, and time.  Skin: Skin is warm and dry. He is not diaphoretic.  Nursing note and vitals reviewed.   ED Course  .Critical Care Performed by: Nona Dell Authorized by: Nona Dell Total critical care time: 45 minutes Critical care was necessary to treat or prevent imminent or life-threatening deterioration  of the following conditions: DKA. Critical care was time spent personally by me on the following activities: blood draw for specimens, development of treatment plan with patient or surrogate, discussions with consultants, interpretation of cardiac output measurements, evaluation of patient's response to treatment, examination of patient, obtaining history from patient or surrogate, ordering and performing treatments and interventions, ordering and review of laboratory studies, ordering and review of radiographic studies, pulse oximetry, re-evaluation of patient's condition and review of old charts.   (including critical care time) Labs Review Labs Reviewed  BASIC METABOLIC PANEL - Abnormal; Notable for the following:    Potassium 5.6 (*)    Chloride 94 (*)    CO2 15 (*)    Glucose, Bld 378 (*)    Creatinine, Ser 1.53 (*)    GFR calc non Af Amer 51 (*)    GFR calc Af Amer 59 (*)    All other components within normal limits  CBC - Abnormal; Notable for the following:    RBC 3.95 (*)  Hemoglobin 12.6 (*)    HCT 38.1 (*)    RDW 17.6 (*)    All other components within normal limits  URINALYSIS, ROUTINE W REFLEX MICROSCOPIC (NOT AT [facility]) - Abnormal; Notable for the following:    Glucose, UA >1000 (*)    Hgb urine dipstick MODERATE (*)    Ketones, ur 40 (*)    Protein, ur >300 (*)    All other components within normal limits  BLOOD GAS, VENOUS - Abnormal; Notable for the following:    pH, Ven 7.210 (*)    pCO2, Ven 38.8 (*)    pO2, Ven 47.1 (*)    Bicarbonate 15.0 (*)    Acid-base deficit 12.0 (*)    All other components within normal limits  LACTIC ACID, PLASMA - Abnormal; Notable for the following:    Lactic Acid, Venous 5.1 (*)    All other components within normal limits  URINE MICROSCOPIC-ADD ON - Abnormal; Notable for the following:    Squamous Epithelial / LPF 0-5 (*)    Casts GRANULAR CAST (*)    All other components within normal limits  LACTIC ACID, PLASMA - Abnormal;  Notable for the following:    Lactic Acid, Venous 2.6 (*)    All other components within normal limits  BASIC METABOLIC PANEL - Abnormal; Notable for the following:    Potassium 6.3 (*)    CO2 16 (*)    Glucose, Bld 179 (*)    Creatinine, Ser 1.38 (*)    Calcium 8.5 (*)    GFR calc non Af Amer 58 (*)    Anion gap 27 (*)    All other components within normal limits  CBG MONITORING, ED - Abnormal; Notable for the following:    Glucose-Capillary 333 (*)    All other components within normal limits  CBG MONITORING, ED - Abnormal; Notable for the following:    Glucose-Capillary 330 (*)    All other components within normal limits  CBG MONITORING, ED - Abnormal; Notable for the following:    Glucose-Capillary 243 (*)    All other components within normal limits  CBG MONITORING, ED - Abnormal; Notable for the following:    Glucose-Capillary 218 (*)    All other components within normal limits  CBG MONITORING, ED - Abnormal; Notable for the following:    Glucose-Capillary 183 (*)    All other components within normal limits  CBG MONITORING, ED - Abnormal; Notable for the following:    Glucose-Capillary 134 (*)    All other components within normal limits  CBG MONITORING, ED - Abnormal; Notable for the following:    Glucose-Capillary 103 (*)    All other components within normal limits  CBG MONITORING, ED - Abnormal; Notable for the following:    Glucose-Capillary 106 (*)    All other components within normal limits  LACTIC ACID, PLASMA    Imaging Review No results found. I have personally reviewed and evaluated these images and lab results as part of my medical decision-making.  Filed Vitals:   10/15/15 2230 10/15/15 2331  BP: 129/83 136/88  Pulse: 104 106  Temp:  98.4 F (36.9 C)  Resp: 17 18     MDM   Final diagnoses:  Diabetic ketoacidosis without coma associated with type 2 diabetes mellitus ([place])    Patient presents with hyperglycemia. He notes he ran out of his  Lantus at home. Endorses nausea and vomiting. Denies fever. VSS. Exam unremarkable. CBG 333. Anion gap 37. Urine revealed  ketones. VBG revealed metabolic acidosis. Lactic acid 5.1. Pt given 2L IVF, insulin infusion in the ED. Dextrose infusion started after pt's BGL decreased to <250.   Consulted hospitalist (family practice). Due to pt's elevated lactic acid of 5.1 they do no advise to have pt transported to [facility] for admission and recommended to either recheck lactic acid in the ED and have pt transferred once lactic acid. Repeat lactic acid 2.6. Recalled hospitalist, Dr. Lonny Prude agrees to admission, attending Dr. Mingo Amber. Orders placed for step-down bed at [facility]. Discussed plan to admission/transfer with pt.  Chesley Noon [place], [place] 10/15/15 2341  Merrily Pew, MD 10/17/15 1730

## 2015-10-15 NOTE — ED Notes (Signed)
Notified [place], [place] of Potassium 6.3.

## 2015-10-15 NOTE — ED Notes (Signed)
Notified [place], [place] that patients Lactic Acid is 4.3.

## 2015-10-15 NOTE — ED Notes (Signed)
Pt's fluids paused, will draw blood from IV.

## 2015-10-15 NOTE — ED Provider Notes (Signed)
Medical screening examination/treatment/procedure(s) were conducted as a shared visit with non-physician practitioner(s) and myself.  I personally evaluated the patient during the encounter.  DKA 2/2 noncompliance 2/2 poverty. Exam ok. No distress. Slightly tachycardic. Slight AKI. Will start ifnusion, fluids and likely admit.    EKG Interpretation   Date/Time:  Sunday October 15 2015 23:14:43 EST Ventricular Rate:  101 PR Interval:  153 QRS Duration: 93 QT Interval:  360 QTC Calculation: 467 R Axis:   73 Text Interpretation:  Sinus tachycardia Left atrial enlargement ED  PHYSICIAN INTERPRETATION AVAILABLE IN [facility] Confirmed by TEST,  Record ([id number]) on 10/16/2015 12:59:17 PM        Merrily Pew, MD 10/17/15 1730

## 2015-10-15 NOTE — ED Notes (Signed)
Per EMS, pt with Hx of DM2 and DKA, been without Lantus x 4 days, attempting to use his home Novolog instead, unable to manage blood sugar without Lantus. C/o hyperglycemia, n/v.

## 2015-10-15 NOTE — ED Notes (Signed)
Bed: WA11 Expected date:  Expected time:  Means of arrival:  Comments: Hall B 

## 2015-10-15 NOTE — ED Notes (Signed)
Notified [place], [place] of a Lactic Acid 2.6.

## 2015-10-16 ENCOUNTER — Encounter (HOSPITAL_COMMUNITY): Payer: Self-pay

## 2015-10-16 DIAGNOSIS — E875 Hyperkalemia: Secondary | ICD-10-CM

## 2015-10-16 DIAGNOSIS — E1110 Type 2 diabetes mellitus with ketoacidosis without coma: Secondary | ICD-10-CM | POA: Insufficient documentation

## 2015-10-16 DIAGNOSIS — E1310 Other specified diabetes mellitus with ketoacidosis without coma: Principal | ICD-10-CM

## 2015-10-16 DIAGNOSIS — E872 Acidosis: Secondary | ICD-10-CM

## 2015-10-16 DIAGNOSIS — I10 Essential (primary) hypertension: Secondary | ICD-10-CM | POA: Insufficient documentation

## 2015-10-16 DIAGNOSIS — R739 Hyperglycemia, unspecified: Secondary | ICD-10-CM

## 2015-10-16 LAB — BASIC METABOLIC PANEL
ANION GAP: 25 — AB (ref 5–15)
Anion gap: 22 — ABNORMAL HIGH (ref 5–15)
Anion gap: 16 — ABNORMAL HIGH (ref 5–15)
Anion gap: 17 — ABNORMAL HIGH (ref 5–15)
Anion gap: 15 (ref 5–15)
Anion gap: 14 (ref 5–15)
Anion gap: 16 — ABNORMAL HIGH (ref 5–15)
BUN: 14 mg/dL (ref 6–20)
BUN: 14 mg/dL (ref 6–20)
BUN: 15 mg/dL (ref 6–20)
BUN: 15 mg/dL (ref 6–20)
BUN: 14 mg/dL (ref 6–20)
BUN: 14 mg/dL (ref 6–20)
BUN: 12 mg/dL (ref 6–20)
CALCIUM: 8.6 mg/dL — AB (ref 8.9–10.3)
CALCIUM: 8.6 mg/dL — AB (ref 8.9–10.3)
CALCIUM: 8.7 mg/dL — AB (ref 8.9–10.3)
CALCIUM: 8.5 mg/dL — AB (ref 8.9–10.3)
CHLORIDE: 96 mmol/L — AB (ref 101–111)
CHLORIDE: 94 mmol/L — AB (ref 101–111)
CO2: 21 mmol/L — AB (ref 22–32)
CO2: 22 mmol/L (ref 22–32)
CO2: 26 mmol/L (ref 22–32)
CO2: 26 mmol/L (ref 22–32)
CO2: 27 mmol/L (ref 22–32)
CO2: 28 mmol/L (ref 22–32)
CO2: 25 mmol/L (ref 22–32)
CREATININE: 1.69 mg/dL — AB (ref 0.61–1.24)
CREATININE: 1.41 mg/dL — AB (ref 0.61–1.24)
CREATININE: 1.35 mg/dL — AB (ref 0.61–1.24)
CREATININE: 1.34 mg/dL — AB (ref 0.61–1.24)
CREATININE: 1.27 mg/dL — AB (ref 0.61–1.24)
CREATININE: 1.1 mg/dL (ref 0.61–1.24)
Calcium: 8.5 mg/dL — ABNORMAL LOW (ref 8.9–10.3)
Calcium: 8.7 mg/dL — ABNORMAL LOW (ref 8.9–10.3)
Calcium: 8.9 mg/dL (ref 8.9–10.3)
Chloride: 98 mmol/L — ABNORMAL LOW (ref 101–111)
Chloride: 99 mmol/L — ABNORMAL LOW (ref 101–111)
Chloride: 97 mmol/L — ABNORMAL LOW (ref 101–111)
Chloride: 97 mmol/L — ABNORMAL LOW (ref 101–111)
Chloride: 94 mmol/L — ABNORMAL LOW (ref 101–111)
Creatinine, Ser: 1.52 mg/dL — ABNORMAL HIGH (ref 0.61–1.24)
GFR calc Af Amer: 52 mL/min — ABNORMAL LOW (ref >60)
GFR calc Af Amer: >60 mL/min (ref >60)
GFR calc Af Amer: >60 mL/min (ref >60)
GFR calc Af Amer: >60 mL/min (ref >60)
GFR calc non Af Amer: 51 mL/min — ABNORMAL LOW (ref >60)
GFR calc non Af Amer: >60 mL/min (ref >60)
GFR calc non Af Amer: >60 mL/min (ref >60)
GFR, EST AFRICAN AMERICAN: 60 mL/min — AB (ref >60)
GFR, EST NON AFRICAN AMERICAN: 45 mL/min — AB (ref >60)
GFR, EST NON AFRICAN AMERICAN: 56 mL/min — AB (ref >60)
GFR, EST NON AFRICAN AMERICAN: 59 mL/min — AB (ref >60)
GFR, EST NON AFRICAN AMERICAN: 60 mL/min — AB (ref >60)
GLUCOSE: 231 mg/dL — AB (ref 65–99)
GLUCOSE: 190 mg/dL — AB (ref 65–99)
GLUCOSE: 145 mg/dL — AB (ref 65–99)
GLUCOSE: 175 mg/dL — AB (ref 65–99)
GLUCOSE: 197 mg/dL — AB (ref 65–99)
Glucose, Bld: 210 mg/dL — ABNORMAL HIGH (ref 65–99)
Glucose, Bld: 156 mg/dL — ABNORMAL HIGH (ref 65–99)
POTASSIUM: 4.2 mmol/L (ref 3.5–5.1)
POTASSIUM: 4.4 mmol/L (ref 3.5–5.1)
POTASSIUM: 4.7 mmol/L (ref 3.5–5.1)
POTASSIUM: 4 mmol/L (ref 3.5–5.1)
Potassium: 5.2 mmol/L — ABNORMAL HIGH (ref 3.5–5.1)
Potassium: 4.6 mmol/L (ref 3.5–5.1)
Potassium: 4.1 mmol/L (ref 3.5–5.1)
SODIUM: 142 mmol/L (ref 135–145)
SODIUM: 141 mmol/L (ref 135–145)
SODIUM: 140 mmol/L (ref 135–145)
SODIUM: 136 mmol/L (ref 135–145)
SODIUM: 135 mmol/L (ref 135–145)
Sodium: 142 mmol/L (ref 135–145)
Sodium: 139 mmol/L (ref 135–145)

## 2015-10-16 LAB — GLUCOSE, CAPILLARY
GLUCOSE-CAPILLARY: 244 mg/dL — AB (ref 65–99)
GLUCOSE-CAPILLARY: 168 mg/dL — AB (ref 65–99)
GLUCOSE-CAPILLARY: 190 mg/dL — AB (ref 65–99)
GLUCOSE-CAPILLARY: 116 mg/dL — AB (ref 65–99)
GLUCOSE-CAPILLARY: 164 mg/dL — AB (ref 65–99)
GLUCOSE-CAPILLARY: 179 mg/dL — AB (ref 65–99)
GLUCOSE-CAPILLARY: 193 mg/dL — AB (ref 65–99)
GLUCOSE-CAPILLARY: 183 mg/dL — AB (ref 65–99)
GLUCOSE-CAPILLARY: 153 mg/dL — AB (ref 65–99)
GLUCOSE-CAPILLARY: 76 mg/dL (ref 65–99)
Glucose-Capillary: 142 mg/dL — ABNORMAL HIGH (ref 65–99)
Glucose-Capillary: 202 mg/dL — ABNORMAL HIGH (ref 65–99)
Glucose-Capillary: 128 mg/dL — ABNORMAL HIGH (ref 65–99)
Glucose-Capillary: 134 mg/dL — ABNORMAL HIGH (ref 65–99)
Glucose-Capillary: 173 mg/dL — ABNORMAL HIGH (ref 65–99)
Glucose-Capillary: 166 mg/dL — ABNORMAL HIGH (ref 65–99)
Glucose-Capillary: 219 mg/dL — ABNORMAL HIGH (ref 65–99)
Glucose-Capillary: 100 mg/dL — ABNORMAL HIGH (ref 65–99)

## 2015-10-16 LAB — CBG MONITORING, ED
Glucose-Capillary: 114 mg/dL — ABNORMAL HIGH (ref 65–99)
Glucose-Capillary: 98 mg/dL (ref 65–99)

## 2015-10-16 LAB — LACTIC ACID, PLASMA
Lactic Acid, Venous: 2.6 mmol/L (ref 0.5–2.0)
Lactic Acid, Venous: 2.1 mmol/L (ref 0.5–2.0)
Lactic Acid, Venous: 2.2 mmol/L (ref 0.5–2.0)
Lactic Acid, Venous: 1.2 mmol/L (ref 0.5–2.0)

## 2015-10-16 LAB — ETHANOL: Alcohol, Ethyl (B): <5 mg/dL (ref <5)

## 2015-10-16 LAB — MRSA PCR SCREENING: MRSA by PCR: POSITIVE — AB

## 2015-10-16 MED ORDER — SODIUM CHLORIDE 0.9 % IV SOLN
INTRAVENOUS | Status: DC
  Filled 2015-10-16: qty 2.5

## 2015-10-16 MED ORDER — ASPIRIN EC 81 MG PO TBEC
81.0000 mg | DELAYED_RELEASE_TABLET | Freq: Every day | ORAL | Status: DC
  Administered 2015-10-16 – 2015-10-18 (×3): 81 mg via ORAL
  Filled 2015-10-16 (×3): qty 1

## 2015-10-16 MED ORDER — METOPROLOL SUCCINATE ER 100 MG PO TB24: 100.0000 mg | ORAL_TABLET | Freq: Every day | ORAL | Status: DC

## 2015-10-16 MED ORDER — GABAPENTIN 100 MG PO CAPS
100.0000 mg | ORAL_CAPSULE | Freq: Three times a day (TID) | ORAL | Status: DC
  Administered 2015-10-16 – 2015-10-17 (×6): 100 mg via ORAL
  Filled 2015-10-16 (×6): qty 1

## 2015-10-16 MED ORDER — THIAMINE HCL 100 MG/ML IJ SOLN: 100.0000 mg | Freq: Every day | INTRAMUSCULAR | Status: DC

## 2015-10-16 MED ORDER — ENOXAPARIN SODIUM 40 MG/0.4ML ~~LOC~~ SOLN
40.0000 mg | SUBCUTANEOUS | Status: DC
  Administered 2015-10-16 – 2015-10-17 (×2): 40 mg via SUBCUTANEOUS
  Filled 2015-10-16 (×3): qty 0.4

## 2015-10-16 MED ORDER — VITAMIN B-1 100 MG PO TABS
100.0000 mg | ORAL_TABLET | Freq: Every day | ORAL | Status: DC
  Administered 2015-10-16 – 2015-10-18 (×3): 100 mg via ORAL
  Filled 2015-10-16 (×3): qty 1

## 2015-10-16 MED ORDER — PANTOPRAZOLE SODIUM 40 MG PO TBEC
40.0000 mg | DELAYED_RELEASE_TABLET | Freq: Every day | ORAL | Status: DC
  Administered 2015-10-16 – 2015-10-18 (×3): 40 mg via ORAL
  Filled 2015-10-16 (×3): qty 1

## 2015-10-16 MED ORDER — PROMETHAZINE HCL 25 MG/ML IJ SOLN: 12.5000 mg | Freq: Four times a day (QID) | INTRAMUSCULAR | Status: DC | PRN

## 2015-10-16 MED ORDER — LORAZEPAM 2 MG/ML IJ SOLN
1.0000 mg | Freq: Four times a day (QID) | INTRAMUSCULAR | Status: DC | PRN
Start: 2015-10-16 — End: 2015-10-18

## 2015-10-16 MED ORDER — DEXTROSE-NACL 5-0.45 % IV SOLN
INTRAVENOUS | Status: DC
  Administered 2015-10-16 (×2): via INTRAVENOUS

## 2015-10-16 MED ORDER — METOPROLOL SUCCINATE ER 100 MG PO TB24
100.0000 mg | ORAL_TABLET | Freq: Every day | ORAL | Status: DC
  Administered 2015-10-16 – 2015-10-18 (×3): 100 mg via ORAL
  Filled 2015-10-16 (×3): qty 1

## 2015-10-16 MED ORDER — PROMETHAZINE HCL 12.5 MG RE SUPP
12.5000 mg | Freq: Four times a day (QID) | RECTAL | Status: DC | PRN
  Filled 2015-10-16: qty 1

## 2015-10-16 MED ORDER — LORAZEPAM 2 MG/ML IJ SOLN
0.0000 mg | Freq: Four times a day (QID) | INTRAMUSCULAR | Status: AC
  Filled 2015-10-16: qty 1

## 2015-10-16 MED ORDER — ADULT MULTIVITAMIN W/MINERALS CH
1.0000 | ORAL_TABLET | Freq: Every day | ORAL | Status: DC
  Administered 2015-10-16 – 2015-10-18 (×3): 1 via ORAL
  Filled 2015-10-16 (×3): qty 1

## 2015-10-16 MED ORDER — FOLIC ACID 1 MG PO TABS
1.0000 mg | ORAL_TABLET | Freq: Every day | ORAL | Status: DC
  Administered 2015-10-16 – 2015-10-18 (×3): 1 mg via ORAL
  Filled 2015-10-16 (×3): qty 1

## 2015-10-16 MED ORDER — MUPIROCIN 2 % EX OINT
1.0000 "application " | TOPICAL_OINTMENT | Freq: Two times a day (BID) | CUTANEOUS | Status: DC
  Administered 2015-10-16 – 2015-10-17 (×4): 1 via NASAL
  Filled 2015-10-16: qty 22

## 2015-10-16 MED ORDER — SODIUM CHLORIDE 0.9 % IV SOLN: INTRAVENOUS | Status: DC

## 2015-10-16 MED ORDER — LORAZEPAM 2 MG/ML IJ SOLN
0.0000 mg | Freq: Two times a day (BID) | INTRAMUSCULAR | Status: DC
Start: 2015-10-18 — End: 2015-10-18
  Administered 2015-10-16: 1 mg via INTRAVENOUS

## 2015-10-16 MED ORDER — CHLORHEXIDINE GLUCONATE CLOTH 2 % EX PADS
6.0000 | MEDICATED_PAD | Freq: Every day | CUTANEOUS | Status: DC
  Administered 2015-10-17 – 2015-10-18 (×2): 6 via TOPICAL

## 2015-10-16 MED ORDER — LORAZEPAM 1 MG PO TABS
1.0000 mg | ORAL_TABLET | Freq: Four times a day (QID) | ORAL | Status: DC | PRN
  Administered 2015-10-16 – 2015-10-18 (×6): 1 mg via ORAL
  Filled 2015-10-16 (×6): qty 1

## 2015-10-16 MED ORDER — ONDANSETRON HCL 4 MG/2ML IJ SOLN
4.0000 mg | Freq: Once | INTRAMUSCULAR | Status: AC
  Administered 2015-10-16: 4 mg via INTRAVENOUS
  Filled 2015-10-16: qty 2

## 2015-10-16 MED ORDER — PROMETHAZINE HCL 25 MG PO TABS
12.5000 mg | ORAL_TABLET | Freq: Four times a day (QID) | ORAL | Status: DC | PRN
  Administered 2015-10-16: 12.5 mg via ORAL
  Filled 2015-10-16: qty 1

## 2015-10-16 NOTE — Progress Notes (Signed)
CRITICAL VALUE ALERT  Critical value received:  Lactic Acid 2.1  Date of notification:  10/15/15  Time of notification:  0500  Critical value read back: yes  Nurse who received alert:  Val Riles  MD notified (1st page):  Family Medicine  Time of first page:  0500  MD notified (2nd page):  Time of second page:  Responding MD:    Time MD responded:

## 2015-10-16 NOTE — Discharge Summary (Signed)
[facility] Discharge Summary  Patient name: Jeffrey [name] Medical record number: [id number] Date of birth: 1965/11/30 Age: 50 y.o. Gender: male Date of Admission: 10/15/2015  Date of Discharge: 10/18/2015 Admitting Physician: Alveda Reasons, MD  Primary Care Provider: Cordelia Poche, MD Consultants: none  Indication for Hospitalization: DKA  Discharge Diagnoses/Problem List:  DM II, obesity, alcohol abuse, history of cardiac arrest, HTN, HLD, Proteinuria, anxiety and OSA.  Disposition: home  Discharge Condition: stable  Discharge Exam:  Temp: [98 F (36.7 C)-98.6 F (37 C)] 98.2 F (36.8 C) (01/11 0707) Pulse Rate: [63-80] 80 (01/11 0707) Resp: [11-20] 20 (01/11 0707) BP: (142-156)/(90-107) 142/95 mmHg (01/11 0707) SpO2: [98 %-100 %] 100 % (01/11 0707) Physical Exam: Gen: appears well today. Oropharynx: clear, moist Neck: supple, no LAD CV: regular rate and rythm. S1 & S2 audible Resp: no apparent work of breathing, clear to auscultation bilaterally. GI: bowel sounds normal, no tenderness to palpation, no rebound or guarding, no mass.  Skin: no lesion MSK: exts without lesion, dorsalis pedis pulse 2+ bilaterally.  Brief Hospital Course:  Jeffrey [name] is a 50 y.o. male presenting as a transfer from [facility] with DKA in the setting of not taking his insulin for the last 5 days. PMH is significant for DM II, obesity, alcohol abuse, cardiac arrest, HTN, HLD, anxiety and OSA. DKA: resolved. On arrival at [facility] ED, his labs were significant for BG to 378, VBG 7.2/39/47/15, UA with glucose to 1000, protein >300 & ketones 40, Na 141, K 5.6, bicarb of 15 and sCr of 1.53 (b/l~1) and AG to 27. He was given 2L of NS and started on NS and IV insulin drips. His BG dropped to 104 before his AG was closed. His insulin was stopped and dextrose drip started before he was transferred to [name]. On arrival here, patient was started on insulin drip with dual IV  fluids per DKA protocol. His blood glucose and BMP were monitored.  After 24 hours on drip, his AG closed and his BMP values normalized.  He was transitioned to subcutaneous insulin.   Prior to discharge, his BG remained in the low 200's. His electrolytes remained stable. He received diabetic teaching. He was discharged on metformin 500 mg twice a day and NPH 13 units in the morning and 7 units in the evening as he doesn't afford his Lantus.   DM-II: A1C 9.5 (10.1 in 07/2015) from not taking his insulin. No with microvascular complications. Discharged on metformin 500 mg twice a day and NPH 13 units in the morning and 7 units in the evening as he doesn't afford Lantus.  We have also increased his gabapentin to 300 mg three times a day for neuropathic pain. He also reports flashes of purple-red colors. This not new new. He has no eye pain. Recommend outpatient opthalmology referral  Lactic acidosis: resolved. Likely due to DKA and alcoholism. No sign of infection or leukocytosis to suggest sepsis. Recommend reminding him the impact of alcohol on his blood sugar/diabetes.  Alcohol abuse: patient has recently been admitted for altered mental status likely related to acute alcohol intoxication. Patient was counseled against drinking at that time. Alcohol intake appears to be consistent with last admission. He was monitored per protocol (CIWA) and has been stable. He was advised to quit drinking.   AKI: resolved. sCr 1.1 (1.67 on admission). Baseline ~1. Likely from dehydration secondary to DKA.  Proteinuria: urine protein >300. Has history of this. This concerning for  nephrotic syndrome likely from poorly controlled diabetes. SPEP and UPEP pending at time of discharge. Consider referral to nephrology further work up as outpt if not improving.  Hyperkalemia: resolved. K 6.3; 5.6 on arrival to [facility] ED. No EKG changes. Likely extracellular shift due to DKA  Melanotic stool: not new. reported melanotic  stool the morning of discharge.Hgb 12.6, better than his baseline (~10) He is past due for colonoscopy. Can consider GI referral if needed.  Other chronic conditions were stable.  Issues for Follow Up:  1. Diabetes: changed his medication to NPH insulin and metformin as he couldn't afford Lantus. Recommedn assessing tolerance and checking CBG at follow up. 2. Vision changes: reports seeing purple-red flashes. No eye pain. Likely from diabetic neuropathy. Recommend referral to opthalmology. 3. Protienuria: urine protein > 300. SPEP and UPEP pending at time of discharge. Recommend repeat UA and nephro referral as needed. 4. History of Melanotic stool: Hgb 12.6 this time (b/l ~10). Due for colonoscopy. Can check CBC at follow up. 5. Alcohol use disorder: advice quitting or moderation.   Significant Procedures: none  Significant Labs and Imaging:   Recent Labs Lab 10/15/15 1500  WBC 7.4  HGB 12.6*  HCT 38.1*  PLT 218    Recent Labs Lab 10/16/15 2252 10/17/15 0248 10/17/15 0640 10/17/15 1645 10/18/15 0747  NA 135 137 138 133* 135  K 4.1 3.5 3.9 3.9 4.4  CL 94* 98* 97* 92* 95*  CO2 25 28 28 27 30   GLUCOSE 156* 141* 215* 202* 216*  BUN 12 10 10 8 7   CREATININE 1.10 1.03 1.13 1.05 0.90  CALCIUM 8.5* 8.4* 8.4* 9.0 8.4*    Results/Tests Pending at Time of Discharge: SPEP and UPEP  Discharge Medications:    Medication List    STOP taking these medications        Insulin Glargine 100 UNIT/ML Solostar Pen  Commonly known as:  LANTUS SOLOSTAR      TAKE these medications        aspirin EC 81 MG tablet  Take 81 mg by mouth daily.     citalopram 20 MG tablet  Commonly known as:  CELEXA  Take 1 tablet (20 mg total) by mouth daily.     folic acid 1 MG tablet  Commonly known as:  FOLVITE  Take 1 tablet (1 mg total) by mouth daily.     gabapentin 300 MG capsule  Commonly known as:  NEURONTIN  Take 1 capsule (300 mg total) by mouth 3 (three) times daily.     insulin  NPH Human 100 UNIT/ML injection  Commonly known as:  NOVOLIN N  Inject 13 units right before breakfast and 7 units right before dinner. Inject under your skin.     Insulin Syringes (Disposable) U-100 0.3 ML Misc  Use one to inject insulin twice daily     lactulose 10 GM/15ML solution  Commonly known as:  CHRONULAC  Take 15 mLs (10 g total) by mouth daily.     LORazepam 0.5 MG tablet  Commonly known as:  ATIVAN  Take 1 tablet (0.5 mg total) by mouth every 8 (eight) hours as needed for anxiety or seizure.     losartan 50 MG tablet  Commonly known as:  COZAAR  Take 1 tablet (50 mg total) by mouth daily.     metFORMIN 500 MG tablet  Commonly known as:  GLUCOPHAGE  Take 1 tablet (500 mg total) by mouth 2 (two) times daily with a meal.  metoprolol succinate 100 MG 24 hr tablet  Commonly known as:  TOPROL-XL  TAKE ONE TABLET BY MOUTH DAILY. TAKE WITH OR IMMEDIATELY FOLLOWING A MEAL     multivitamin with minerals Tabs tablet  Take 1 tablet by mouth daily.     omeprazole 20 MG capsule  Commonly known as:  PRILOSEC  Take 1 capsule (20 mg total) by mouth 2 (two) times daily before a meal.     polyvinyl alcohol 1.4 % ophthalmic solution  Commonly known as:  LIQUIFILM TEARS  Place 1 drop into both eyes every 4 (four) hours as needed for dry eyes.     traMADol 50 MG tablet  Commonly known as:  ULTRAM  Take 1 tablet (50 mg total) by mouth every 12 (twelve) hours as needed for severe pain.        Discharge Instructions: Please refer to Patient Instructions section of EMR for full details.  Patient was counseled important signs and symptoms that should prompt return to medical care, changes in medications, dietary instructions, activity restrictions, and follow up appointments.   Follow-Up Appointments: Follow-up Information    Follow up with Cordelia Poche, MD On 10/24/2015.   Specialty:  Family Medicine   Why:  at 10:30 am. Follow up on DKA   Contact information:   [place] [phone]       Mercy Riding, MD 10/19/2015, 12:48 AM PGY-1, [place]

## 2015-10-16 NOTE — ED Notes (Signed)
[facility] here to transfer pt to [facility]. 

## 2015-10-16 NOTE — Care Management Note (Signed)
Case Management Note  Patient Details  Name: Jeffrey [name] MRN: [id number] Date of Birth: [birth year]  Subjective/Objective:   Adm w dka, iv ins drip                 Action/Plan: lives w wife, pcp dr Teryl Lucy   Expected Discharge Date:                  Expected Discharge Plan:     In-House Referral:     Discharge planning Services     Post Acute Care Choice:    Choice offered to:     DME Arranged:    DME Agency:     HH Arranged:    [place] Agency:     Status of Service:     Medicare Important Message Given:    Date Medicare IM Given:    Medicare IM give by:    Date Additional Medicare IM Given:    Additional Medicare Important Message give by:     If discussed at [place] of Stay Meetings, dates discussed:    Additional Comments: ur review done  Lacretia Leigh, RN 10/16/2015, 8:42 AM

## 2015-10-16 NOTE — ED Notes (Signed)
[facility] called---- report on pt given.

## 2015-10-16 NOTE — H&P (Signed)
[facility] Admission History and Physical Service Pager: [phone]  Patient name: Jeffrey [name] Medical record number: [id number] Date of birth: Nov 08, 1965 Age: 50 y.o. Gender: male  Primary Care Provider: Cordelia Poche, MD Consultants: none Code Status: full  Chief Complaint: DKA  Assessment and Plan: Jeffrey [name] is a 50 y.o. male presenting as a transfer from [facility] Ed with DKA in the setting of not taking his insulin for the last 5 days. PMH is significant for DM II, obesity, alcohol abuse, cardiac arrest, HTN, HLD, anxiety and OSA.  DKA/poorly controlled DM-II: A1c 10.1 in 07/2015. DKA in the setting of not taking his insulin for the last 5 days, and alcohol abuse. BG to 378. VBG 7.2/39/47/15. UA with glucose to 1000, protein >300 & ketones 40. CBC within normal limit making infectious process less likely. BMP significant for Na 141, K 5.6 and sCr of 1.53 (b/l~1). He was given 2L of NS and started on NS and IV insulin drips at [facility] ED. His BG dropped to 134. Repeat BMP at this time significant for bicarb 16, K 6.3 and AG 27. Then, he was started on D5, and his insulin drip was held. -Admit to [place]. Attending Dr. Mingo Amber -IVF and Insulin per protocol for DKA. Doesn't need K at this time -BMP every two hours since potassium significantly elevated -CBG per protocol -Cardiac monitoring -vitals signs per protocol -NPO -Continue home gabapentin -Zofran as needed nausea -Diabetic teaching. Consider adding oral meds -Case management for medications - A1C  Lactic acidosis: lactic acid 5.1>2.6>4.3. History of this in the past due to alcoholism. His last drink is two days ago. DKA could contribute to this as well. No signs of infections. CBC within normal limit. He is normotensive. Reports palpitation. He also has history of anxiety.   - trend lactic acid; goal <2 - check EtOH level - CIWA protocol  AKI: likely from dehydration secondary to DKA. Cr 1.51 on  presentation to [facility] ED. Baseline ~1. Trended down to 1.38 after IV fluids. -Anticipate improvement with rehydration -hold home losartan  Alcohol abuse: patient has recently been admitted for altered mental status likely related to acute alcohol intoxication. Patient was counseled against drinking at that time. Alcohol intake appears to be consistent with last admission. - CIWA as above - social work consult  Proteinuria: urine protein >300. Also has elevated urine protein in the past as well. This concerning for nephrotic syndrome. Also with >1000 glucose in urine which may be contributing. -Consider outpatient referral to nephrology   Hyperkalemia: K 6.3; 5.6 on arrival to [facility] ED. No EKG changes. Likely extracellular shift due to DKA -Continue IVF and insulin drip per DKA protocol - BMP every two hours  Hypertension- on losartan 50 mg daily and Toprol 100 mg daily at home. Normotensive. - Continue home Toprol - hold home losartan with AKI  Obstructive Sleep Apnea- History of refusing CPAP in past - Will order CPAP nightly.   FEN/GI: Diet HH/carb modified; IV fluids at 161mL for 6 hours Prophylaxis: Hep sq  FEN/GI: NPO except for sips with meds Glucomander Replete as needed  Prophylaxis: lovenox  Disposition: step down pending resolution of DKA  History of Present Illness:  Jeffrey [name] is a 50 y.o. male tranferred from [facility] ED with DKA.  Patient reports not taking his Lantus insulin for the last 5 days because he could not afford it. Yesterday, he felt disoriented and called EMS. He reports feeling anxious. He denies vomiting  but reports dry heaving. Denies chest pain, fever, cough, abdominal pain, polyuria, polydypsia or dysuria. He reports drinking EtOH, about 2 large cans of beer 5 days a week. His last drink was two days ago. He reports having alcohol withdrawal in the past. Denies smoking and recreational drug use.  At [facility] ED, he was found to have elevated BG to 378. His  VBG was 7.2/39/47/15. UA with glucose to 1000, protein >300 & ketones 40. His lactic acid was elevated to 5.1 as well. CBC within normal limit. BMP significant for Na 141, K 5.6 and sCr of 1.53 (b/l~1). He was given 2L of NS and started on IV insulin dris. His BG dropped to <250 without D5 fluids started. At around a blood sugar in 180s, D5 fluid started. Blood sugar continued to drop down to 100s and insulin drip was eventually held. Repeat BMP significant for bicarb 16, K 6.3 and AG 27 and insulin drip restarted. EKG requested and not obtained. Repeat lactic acid dropped to 2.6 then increased to 4.3 before patietn transported to [facility].  On arrival here he was on NS and Dextrose without insulin drip. Otherwise, appeared stable.  Review Of Systems: Per HPI  Otherwise the remainder of the systems were negative.  Patient Active Problem List   Diagnosis Date Noted  . DKA (diabetic ketoacidoses) ([place]) 10/15/2015  . Lactic acidosis 09/13/2015  . Altered mental status 09/12/2015  . Diabetic polyneuropathy associated with type 2 diabetes mellitus ([place]) 08/04/2015  . Hepatic cirrhosis ([place]) 03/22/2015  . Ataxia 03/20/2015  . Aphasia   . Elevated liver enzymes   . Toxic metabolic encephalopathy 123456  . Dysarthria 03/09/2015  . AKI (acute kidney injury) ([place])   . Alcohol intoxication ([place])   . Abdominal pain 11/17/2014  . Acute blood loss anemia 11/17/2014  . Hematemesis with nausea   . Alcohol abuse   . Hyperkalemia   . Tachycardia   . Hematemesis 11/16/2014  . Gastrointestinal hemorrhage with melena 10/10/2014  . Erosive esophagitis 10/10/2014  . HLD (hyperlipidemia)   . Anemia due to other cause   . Hyperglycemia   . Syncope and collapse   . Dehydration 05/18/2014  . OSA on CPAP 12/28/2012  . Pulmonary edema 12/08/2012  . Hypoglycemia 12/07/2012  . Cardiac arrest ([place]) 12/07/2012  . Obesity 12/07/2012  . Anoxic brain injury ([place]) 12/07/2012  . GI bleed 11/26/2012  .  Venous insufficiency 12/13/2011  . INSOMNIA, CHRONIC 02/14/2009  . 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 ALCOHOL ABUSE, HX OF 11/10/2007  . ERECTILE DYSFUNCTION 04/14/2007  . DM type 2 goal A1C below 7.5 12/04/2006  . HYPERCHOLESTEROLEMIA 12/04/2006  . OBESITY, NOS 12/04/2006  . Anxiety state 12/04/2006  . HYPERTENSION, BENIGN SYSTEMIC 12/04/2006  . ARTHRITIS 12/04/2006    Past Medical History: Past Medical History  Diagnosis Date  . Hypertension   . Blood transfusion 2014    "related to bleeding ulcer"  . Venous insufficiency 12/13/2011  . Cardiac arrest ([place]) 12/07/2012    Anoxic encephalopathy  . Anxiety   . Acute renal insufficiency 12/08/2012  . High cholesterol   . OSA on CPAP 12/07/2012  . Type II diabetes mellitus ([place])   . Bleeding ulcer 2014  . GERD (gastroesophageal reflux disease)   . Anoxic encephalopathy [facility])     Past Surgical History: Past Surgical History  Procedure Laterality Date  . Knee arthroscopy Left ~ 1999  . Esophagogastroduodenoscopy Left 11/26/2012    Procedure: ESOPHAGOGASTRODUODENOSCOPY (EGD);  Surgeon: Wonda Horner, MD;  Location: [facility] ENDOSCOPY;  Service: Endoscopy;  Laterality: Left;  . Cardiovascular stress test  03/16/13    Very Poor Exercise Tolerance; NON DIAGNOSTIC TEST  . Cataract extraction w/ intraocular lens implant Left 03/07/2014    [place] @ [facility]  . Esophagogastroduodenoscopy N/A 10/10/2014    Procedure: ESOPHAGOGASTRODUODENOSCOPY (EGD);  Surgeon: Ladene Artist, MD;  Location: [facility] ENDOSCOPY;  Service: Endoscopy;  Laterality: N/A;    Social History: Social History  Substance Use Topics  . Smoking status: Never Smoker   . Smokeless tobacco: Current User    Types: Snuff  . Alcohol Use: 3.6 oz/week    6 Cans of beer per week   Additional social history:  Please also refer to relevant sections of EMR.  Family History: No family history of diabetes or heart disease  Allergies and  Medications: Allergies  Allergen Reactions  . Lisinopril Other (See Comments)    Pt reports nose bleed.   No current facility-administered medications on file prior to encounter.   Current Outpatient Prescriptions on File Prior to Encounter  Medication Sig Dispense Refill  . citalopram (CELEXA) 20 MG tablet Take 1 tablet (20 mg total) by mouth daily. 30 tablet 3  . folic acid (FOLVITE) 1 MG tablet Take 1 tablet (1 mg total) by mouth daily. 30 tablet 0  . gabapentin (NEURONTIN) 100 MG capsule Take 1 capsule (100 mg total) by mouth 3 (three) times daily. 90 capsule 3  . Insulin Glargine (LANTUS SOLOSTAR) 100 UNIT/ML Solostar Pen Inject 20 Units into the skin daily. (Patient taking differently: Inject 20 Units into the skin daily at 10 pm. ) 5 pen prn  . LORazepam (ATIVAN) 0.5 MG tablet Take 1 tablet (0.5 mg total) by mouth every 8 (eight) hours as needed for anxiety or seizure. 90 tablet 0  . losartan (COZAAR) 50 MG tablet Take 1 tablet (50 mg total) by mouth daily. 30 tablet 1  . metoprolol succinate (TOPROL-XL) 100 MG 24 hr tablet TAKE ONE TABLET BY MOUTH DAILY. TAKE WITH OR IMMEDIATELY FOLLOWING A MEAL (Patient taking differently: Take 100 mg by mouth at bedtime. ) 30 tablet 6  . omeprazole (PRILOSEC) 20 MG capsule Take 1 capsule (20 mg total) by mouth 2 (two) times daily before a meal. 120 capsule 6  . lactulose (CHRONULAC) 10 GM/15ML solution Take 15 mLs (10 g total) by mouth daily. (Patient not taking: Reported on 09/12/2015) 240 mL 0  . traMADol (ULTRAM) 50 MG tablet Take 1 tablet (50 mg total) by mouth every 12 (twelve) hours as needed for severe pain. (Patient not taking: Reported on 09/12/2015) 30 tablet 0    Objective: BP 151/85 mmHg  Pulse 98  Temp(Src) 98.4 F (36.9 C) (Oral)  Resp 0  Ht 5\' 10"  (1.778 m)  Wt 216 lb 0.8 oz (98 kg)  BMI 31.00 kg/m2  SpO2 95% Exam: Gen: appears obese, no distress Oropharynx: clear, moist Neck: supple, no LAD CV: regular rate and rythm. S1 &  S2 audible, no murmurs. Cap refills < 3 seconds, 2+ DP pulses bilaterally Resp: no apparent work of breathing, clear to auscultation bilaterally. GI: bowel sounds normal, no tenderness to palpation, no rebound or guarding, no mass.  Skin: no lesion Musculoskeletal: no calf tenderness Neuro: AAOx4  Labs and Imaging: CBC BMET   Recent Labs Lab 10/15/15 1500  WBC 7.4  HGB 12.6*  HCT 38.1*  PLT 218    Recent Labs Lab 10/15/15 2158  NA 144  K 6.3*  CL 101  CO2 16*  BUN 16  CREATININE 1.38*  GLUCOSE 179*  CALCIUM 8.5*      Mercy Riding, MD 10/16/2015, 3:21 AM PGY-1[facility] Intern pager: [phone], text pages welcome  I have seen and examined the patient. I have read and agree with the above note. My changes are noted in blue.  Cordelia Poche, MD PGY-3, [facility] 10/16/2015, 6:33 AM

## 2015-10-16 NOTE — Progress Notes (Signed)
Paged Family Medicine regarding patient transferring to room 2H16 from [facility] ED via [place].

## 2015-10-16 NOTE — ED Notes (Signed)
[facility] was notified of pt's transfer to [facility]. 

## 2015-10-16 NOTE — Progress Notes (Signed)
Lab called to say the BMET look hemolyzed and they were sending a tech to redraw.

## 2015-10-17 LAB — PROTEIN ELECTROPHORESIS, SERUM
A/G Ratio: 0.9 (ref 0.7–1.7)
ALPHA-2-GLOBULIN: 0.9 g/dL (ref 0.4–1.0)
Albumin ELP: 3.2 g/dL (ref 2.9–4.4)
Alpha-1-Globulin: 0.3 g/dL (ref 0.0–0.4)
Beta Globulin: 1.3 g/dL (ref 0.7–1.3)
GAMMA GLOBULIN: 1.1 g/dL (ref 0.4–1.8)
GLOBULIN, TOTAL: 3.5 g/dL (ref 2.2–3.9)
Total Protein ELP: 6.7 g/dL (ref 6.0–8.5)

## 2015-10-17 LAB — BASIC METABOLIC PANEL
ANION GAP: 11 (ref 5–15)
Anion gap: 13 (ref 5–15)
Anion gap: 14 (ref 5–15)
BUN: 10 mg/dL (ref 6–20)
BUN: 10 mg/dL (ref 6–20)
BUN: 8 mg/dL (ref 6–20)
CALCIUM: 9 mg/dL (ref 8.9–10.3)
CHLORIDE: 98 mmol/L — AB (ref 101–111)
CHLORIDE: 97 mmol/L — AB (ref 101–111)
CO2: 28 mmol/L (ref 22–32)
CO2: 28 mmol/L (ref 22–32)
CO2: 27 mmol/L (ref 22–32)
CREATININE: 1.05 mg/dL (ref 0.61–1.24)
Calcium: 8.4 mg/dL — ABNORMAL LOW (ref 8.9–10.3)
Calcium: 8.4 mg/dL — ABNORMAL LOW (ref 8.9–10.3)
Chloride: 92 mmol/L — ABNORMAL LOW (ref 101–111)
Creatinine, Ser: 1.03 mg/dL (ref 0.61–1.24)
Creatinine, Ser: 1.13 mg/dL (ref 0.61–1.24)
GLUCOSE: 202 mg/dL — AB (ref 65–99)
Glucose, Bld: 141 mg/dL — ABNORMAL HIGH (ref 65–99)
Glucose, Bld: 215 mg/dL — ABNORMAL HIGH (ref 65–99)
POTASSIUM: 3.5 mmol/L (ref 3.5–5.1)
POTASSIUM: 3.9 mmol/L (ref 3.5–5.1)
Potassium: 3.9 mmol/L (ref 3.5–5.1)
SODIUM: 137 mmol/L (ref 135–145)
SODIUM: 138 mmol/L (ref 135–145)
Sodium: 133 mmol/L — ABNORMAL LOW (ref 135–145)

## 2015-10-17 LAB — GLUCOSE, CAPILLARY
GLUCOSE-CAPILLARY: 126 mg/dL — AB (ref 65–99)
GLUCOSE-CAPILLARY: 157 mg/dL — AB (ref 65–99)
GLUCOSE-CAPILLARY: 165 mg/dL — AB (ref 65–99)
GLUCOSE-CAPILLARY: 184 mg/dL — AB (ref 65–99)
Glucose-Capillary: 115 mg/dL — ABNORMAL HIGH (ref 65–99)
Glucose-Capillary: 128 mg/dL — ABNORMAL HIGH (ref 65–99)
Glucose-Capillary: 148 mg/dL — ABNORMAL HIGH (ref 65–99)
Glucose-Capillary: 177 mg/dL — ABNORMAL HIGH (ref 65–99)
Glucose-Capillary: 185 mg/dL — ABNORMAL HIGH (ref 65–99)
Glucose-Capillary: 202 mg/dL — ABNORMAL HIGH (ref 65–99)
Glucose-Capillary: 140 mg/dL — ABNORMAL HIGH (ref 65–99)
Glucose-Capillary: 178 mg/dL — ABNORMAL HIGH (ref 65–99)
Glucose-Capillary: 252 mg/dL — ABNORMAL HIGH (ref 65–99)

## 2015-10-17 LAB — UIFE/LIGHT CHAINS/TP QN, 24-HR UR
% BETA, Urine: 10.6 %
ALPHA 1 URINE: 4.4 %
Albumin, U: 63.6 %
Alpha 2, Urine: 6.6 %
FREE LT CHN EXCR RATE: 1530 mg/L — AB (ref 1.35–24.19)
Free Kappa/Lambda Ratio: 2.07 (ref 2.04–10.37)
Free Lambda Lt Chains,Ur: 738 mg/L — ABNORMAL HIGH (ref 0.24–6.66)
GAMMA GLOBULIN URINE: 14.9 %
TOTAL PROTEIN, URINE-UPE24: 557.4 mg/dL

## 2015-10-17 MED ORDER — INSULIN GLARGINE 100 UNIT/ML ~~LOC~~ SOLN
8.0000 [IU] | Freq: Every day | SUBCUTANEOUS | Status: DC
  Administered 2015-10-17: 8 [IU] via SUBCUTANEOUS
  Filled 2015-10-17 (×2): qty 0.08

## 2015-10-17 MED ORDER — INSULIN ASPART 100 UNIT/ML ~~LOC~~ SOLN
0.0000 [IU] | SUBCUTANEOUS | Status: DC
  Administered 2015-10-17: 12 [IU] via SUBCUTANEOUS
  Administered 2015-10-17: 4 [IU] via SUBCUTANEOUS
  Administered 2015-10-17: 2 [IU] via SUBCUTANEOUS
  Administered 2015-10-17: 4 [IU] via SUBCUTANEOUS
  Administered 2015-10-17: 2 [IU] via SUBCUTANEOUS
  Administered 2015-10-18: 12 [IU] via SUBCUTANEOUS
  Administered 2015-10-18 (×2): 8 [IU] via SUBCUTANEOUS

## 2015-10-17 MED ORDER — LOSARTAN POTASSIUM 50 MG PO TABS
50.0000 mg | ORAL_TABLET | Freq: Every day | ORAL | Status: DC
  Administered 2015-10-17 – 2015-10-18 (×2): 50 mg via ORAL
  Filled 2015-10-17 (×2): qty 1

## 2015-10-17 MED ORDER — HYDRALAZINE HCL 20 MG/ML IJ SOLN
5.0000 mg | Freq: Four times a day (QID) | INTRAMUSCULAR | Status: DC | PRN
  Administered 2015-10-17: 5 mg via INTRAVENOUS
  Filled 2015-10-17: qty 1

## 2015-10-17 MED ORDER — INSULIN ASPART 100 UNIT/ML ~~LOC~~ SOLN
0.0000 [IU] | SUBCUTANEOUS | Status: DC
Start: 2015-10-17 — End: 2015-10-17

## 2015-10-17 MED ORDER — INSULIN ASPART 100 UNIT/ML ~~LOC~~ SOLN
3.0000 [IU] | Freq: Three times a day (TID) | SUBCUTANEOUS | Status: DC
  Administered 2015-10-17 – 2015-10-18 (×5): 3 [IU] via SUBCUTANEOUS

## 2015-10-17 NOTE — Progress Notes (Signed)
Family Medicine Teaching Service Daily Progress Note Intern Pager: [phone]  Patient name: Jeffrey [name] Medical record number: [id number] Date of birth: April 24, 1966 Age: 50 y.o. Gender: [name]  Primary Care Provider: Cordelia Poche, MD Consultants: none Code Status: full  Pt Overview and Major Events to Date:  01/09  Admitted with DKA  Assessment and Plan: [name] is a 50 y.o. [name] presenting as a transfer from [facility] Ed with DKA in the setting of not taking his insulin for the last 5 days. PMH is significant for DM II, obesity, alcohol abuse, cardiac arrest, HTN, HLD, anxiety and OSA.  DKA/poorly controlled DM-II (A1c 10.1 in 07/2015) form not taking his insulin: resolved. AG closed. CBG in low 100's. Transitioned to subQ insulin this morning. -CBG ACHS -Continue lantus -Continue home gabapentin -Zofran as needed nausea -Diabetic teaching. Consider adding oral meds -Case management for medications. Consider discharging on oral meds. - f/u A1C  Lactic acidosis: resolved. lactic acid 5.1>2.6>4.3>1.2. Due to DKA and alcoholism.  - CIWA protocol   AKI: resolved. sCr 1.1. Baseline ~1. Likely from dehydration secondary to DKA. -resumed home losartan  Alcohol abuse: patient has recently been admitted for altered mental status likely related to acute alcohol intoxication. Patient was counseled against drinking at that time. Alcohol intake appears to be consistent with last admission. - CIWA as above  Proteinuria: urine protein >300. Has history of this. This concerning for nephrotic syndrome.  -Consider outpatient referral to nephrology -f/u SPEP/UPEP -further work up as outpt if not improving.  Hyperkalemia: resolved. K 6.3; 5.6 on arrival to [facility] ED. No EKG changes. Likely extracellular shift due to DKA - BMP daily  Hypertension- normotensive this morning. On losartan 50 mg daily and Toprol 100 mg daily at home. - Continue home Toprol - resumed losartan   OSA- History of  refusing CPAP in past - Will order CPAP nightly.   FEN/GI: Diet HH/carb modified; IV fluids at 146mL for 6 hours Prophylaxis: Hep sq  FEN/GI: Carb modified diet Continue IVF Protonix  Prophylaxis: lovenox  Dispo: transfer to floor.   Subjective:  Reports neuropathic pain in his feet. Otherwise, no complaint. Ate breakfast this morning. Discussed about trying pills for his diabetes and he is open to it.  Objective: Temp:  [97.6 F (36.4 C)-98.7 F (37.1 C)] 97.8 F (36.6 C) (01/10 0746) Pulse Rate:  [63-95] 73 (01/10 0746) Resp:  [13-20] 13 (01/10 0746) BP: (143-184)/(80-111) 143/80 mmHg (01/10 0746) SpO2:  [99 %-100 %] 100 % (01/10 0746) Physical Exam: Gen: appears well today. Oropharynx: clear, moist Neck: supple, no LAD CV: regular rate and rythm. S1 & S2 audible Resp: no apparent work of breathing, clear to auscultation bilaterally. GI: bowel sounds normal, no tenderness to palpation, no rebound or guarding, no mass.  Skin: no lesion MSK: exts without lesion, dorsalis pedis pulse 2+ bilaterally.  Laboratory:  Recent Labs Lab 10/15/15 1500  WBC 7.4  HGB 12.6*  HCT 38.1*  PLT 218    Recent Labs Lab 10/16/15 2252 10/17/15 0248 10/17/15 0640  NA 135 137 138  K 4.1 3.5 3.9  CL 94* 98* 97*  CO2 25 28 28   BUN 12 10 10   CREATININE 1.10 1.03 1.13  CALCIUM 8.5* 8.4* 8.4*  GLUCOSE 156* 141* 215*    Imaging/Diagnostic Tests:  Mercy Riding, MD 10/17/2015, 7:56 AM PGY-1, [place] Intern pager: [phone], text pages welcome

## 2015-10-17 NOTE — Progress Notes (Signed)
NURSING PROGRESS NOTE  Jeffrey [name] [id number] Transfer Data: 10/17/2015 3:34 PM Attending Provider: Alveda Reasons, MD [id number] Lonny Prude, MD Code Status: FULL  Jeffrey [name] is a 50 y.o. male patient transferred from [place] -No acute distress noted.  -No complaints of shortness of breath.  -No complaints of chest pain.   Cardiac Monitoring: Box # 20 in place. Cardiac monitor yields:normal sinus rhythm.  Blood pressure 151/101, pulse 78, temperature 98.6 F (37 C), temperature source Oral, resp. rate 20, height 5\' 10"  (1.778 m), weight 98 kg (216 lb 0.8 oz), SpO2 99 %.    Allergies:  Lisinopril  Past Medical History:   has a past medical history of Hypertension; Blood transfusion (2014); Venous insufficiency (12/13/2011); Cardiac arrest ([place]) (12/07/2012); Anxiety; Acute renal insufficiency (12/08/2012); High cholesterol; OSA on CPAP (12/07/2012); Type II diabetes mellitus ([place]); Bleeding ulcer (2014); GERD (gastroesophageal reflux disease); and Anoxic encephalopathy ([place]).  Past Surgical History:   has past surgical history that includes Knee arthroscopy (Left, ~ 1999); Esophagogastroduodenoscopy (Left, 11/26/2012); Cardiovascular stress test (03/16/13); Cataract extraction w/ intraocular lens implant (Left, 03/07/2014); and Esophagogastroduodenoscopy (N/A, 10/10/2014).  Social History:   reports that he has never smoked. His smokeless tobacco use includes Snuff. He reports that he drinks about 3.6 oz of alcohol per week. He reports that he does not use illicit drugs.  Skin: intact  Patient/Family orientated to room. Information packet given to patient/family. Admission inpatient armband information verified with patient/family to include name and date of birth and placed on patient arm. Side rails up x 2, fall assessment and education completed with patient/family. Patient/family able to verbalize understanding of risk associated with falls and verbalized understanding to call for assistance  before getting out of bed. Call light within reach. Patient/family able to voice and demonstrate understanding of unit orientation instructions.    Will continue to evaluate and treat per MD orders.

## 2015-10-17 NOTE — Progress Notes (Signed)
Report received from [place] ,[place] on [facility]

## 2015-10-17 NOTE — Clinical Social Work Note (Signed)
Clinical Social Work Assessment  Patient Details  Name: Jeffrey [name] MRN: [id number] Date of Birth: October 29, 1965  Date of referral:  10/17/15               Reason for consult:  Substance Use/ETOH Abuse                Permission sought to share information with:    Permission granted to share information::  No  Name::        Agency::     Relationship::     Contact Information:     Housing/Transportation Living arrangements for the past 2 months:  Single Family Home Source of Information:  Patient Patient Interpreter Needed:  None Criminal Activity/Legal Involvement Pertinent to Current Situation/Hospitalization:  No - Comment as needed Significant Relationships:  Spouse, Adult Children Lives with:  Spouse Do you feel safe going back to the place where you live?  Yes Need for family participation in patient care:  Yes (Comment)  Care giving concerns:  Patient does not list any care giving concerns at this time. He plans to return home at discharge with the support of his wife and sons.   Social Worker assessment / plan:  CSW met with patient at bedside to complete assessment of alcohol use disorder. Patient shares that he was admitted from home where he lives with his wife. In addition to his wife, the patient has two supportive sons. CSW inquired about the patient's ETOH use and completed SBIRT with the patient. Patient shares that he started drinking when he was a teenager and has had several bouts of sobriety over the past 30 years. The patient's most recent episode of sobriety lasted about 1 year. He states the longest amount of time he has remained sober was for 5 years. The patient shares that he typically has about 2 40oz beers when he drinks and he does this about 3-4 times a week. He does not let his alcohol use interfere with his work or family relationships. The patient does however see how his alcohol use has become problematic for his health. He has been through withdrawals  before and has expeirenced shakiness and seizures in the past. The patient feels that avoiding the withdrawal symptoms is his main trigger for continued alcohol use. He feels that he can maintained abstinence from alcohol if his withdrawal symptoms are well managed when.. Other triggers for the patient include his anxiety and social situations where drinking takes placed. CSW attempted to explore alternative coping skills with the patient but the patient just continued to state that he would simply not drink again. The patient has had success in the past with not drinking and he does appear to have good motivation to quit. Outpatient and residential treatment options were provided to the patient. He plans to followup outpatient if needed. CSW signing off at this time as the patient does not require any further CSW intervention.  Employment status:  Systems developer information:  Self Pay (Medicaid Pending) PT Recommendations:  Not assessed at this time Information / Referral to community resources:  SBIRT, Outpatient Substance Abuse Treatment Options, Residential Substance Abuse Treatment Options, Support Groups  Patient/Family's Response to care:  Patient is happy with the care he has received.  Patient/Family's Understanding of and Emotional Response to Diagnosis, Current Treatment, and Prognosis:  Patient appears to have a good understanding of the reason for his admission and his diagnosis. The patient appears to have good motivation to stop his alcohol use  and understands the effect this is having on his health.   Emotional Assessment Appearance:  Appears stated age Attitude/Demeanor/Rapport:  Other (Appropriate and welcoming of CSW.) Affect (typically observed):  Accepting, Appropriate, Calm, Pleasant Orientation:  Oriented to Self, Oriented to Place, Oriented to  Time, Oriented to Situation Alcohol / Substance use:  Alcohol Use Psych involvement (Current and /or in the community):  No  (Comment)  Discharge Needs  Concerns to be addressed:  Substance Abuse Concerns Readmission within the last 30 days:  No Current discharge risk:  Substance Abuse Barriers to Discharge:  Continued Medical Work up   [employer] MSW, [place], [place], [phone]

## 2015-10-18 LAB — BASIC METABOLIC PANEL
ANION GAP: 10 (ref 5–15)
BUN: 7 mg/dL (ref 6–20)
CHLORIDE: 95 mmol/L — AB (ref 101–111)
CO2: 30 mmol/L (ref 22–32)
Calcium: 8.4 mg/dL — ABNORMAL LOW (ref 8.9–10.3)
Creatinine, Ser: 0.9 mg/dL (ref 0.61–1.24)
GFR calc Af Amer: >60 mL/min (ref >60)
GFR calc non Af Amer: >60 mL/min (ref >60)
Glucose, Bld: 216 mg/dL — ABNORMAL HIGH (ref 65–99)
POTASSIUM: 4.4 mmol/L (ref 3.5–5.1)
SODIUM: 135 mmol/L (ref 135–145)

## 2015-10-18 LAB — GLUCOSE, CAPILLARY
GLUCOSE-CAPILLARY: 255 mg/dL — AB (ref 65–99)
GLUCOSE-CAPILLARY: 216 mg/dL — AB (ref 65–99)
Glucose-Capillary: 47 mg/dL — ABNORMAL LOW (ref 65–99)
Glucose-Capillary: 110 mg/dL — ABNORMAL HIGH (ref 65–99)
Glucose-Capillary: 244 mg/dL — ABNORMAL HIGH (ref 65–99)
Glucose-Capillary: 208 mg/dL — ABNORMAL HIGH (ref 65–99)

## 2015-10-18 LAB — HEMOGLOBIN A1C
HEMOGLOBIN A1C: 9.5 % — AB (ref 4.8–5.6)
MEAN PLASMA GLUCOSE: 226 mg/dL

## 2015-10-18 MED ORDER — LORAZEPAM 0.5 MG PO TABS: 0.5000 mg | ORAL_TABLET | Freq: Three times a day (TID) | ORAL | Status: DC | PRN

## 2015-10-18 MED ORDER — GABAPENTIN 300 MG PO CAPS: 300.0000 mg | ORAL_CAPSULE | Freq: Three times a day (TID) | ORAL | Status: DC

## 2015-10-18 MED ORDER — INSULIN SYRINGES (DISPOSABLE) U-100 0.3 ML MISC
Status: DC
Start: 2015-10-18 — End: 2017-06-09

## 2015-10-18 MED ORDER — GABAPENTIN 100 MG PO CAPS
200.0000 mg | ORAL_CAPSULE | Freq: Three times a day (TID) | ORAL | Status: DC
  Administered 2015-10-18: 200 mg via ORAL
  Filled 2015-10-18: qty 2

## 2015-10-18 MED ORDER — INSULIN GLARGINE 100 UNIT/ML ~~LOC~~ SOLN
10.0000 [IU] | Freq: Every day | SUBCUTANEOUS | Status: DC
  Administered 2015-10-18: 10 [IU] via SUBCUTANEOUS
  Filled 2015-10-18: qty 0.1

## 2015-10-18 MED ORDER — LACTULOSE 10 GM/15ML PO SOLN: 10.0000 g | Freq: Every day | ORAL | Status: DC

## 2015-10-18 MED ORDER — METFORMIN HCL 500 MG PO TABS: 500.0000 mg | ORAL_TABLET | Freq: Two times a day (BID) | ORAL | Status: DC

## 2015-10-18 MED ORDER — INSULIN NPH (HUMAN) (ISOPHANE) 100 UNIT/ML ~~LOC~~ SUSP: SUBCUTANEOUS | Status: DC

## 2015-10-18 MED ORDER — SODIUM CHLORIDE 0.9 % IV SOLN
INTRAVENOUS | Status: DC
  Administered 2015-10-18: 05:00:00 via INTRAVENOUS

## 2015-10-18 NOTE — Discharge Instructions (Addendum)
It has been a pleasure taking care of you, Mr. Dorson! You were admitted due to diabetic ketoacidosis (see below). This is likely from not taking your insulin as you are supposed to be. We gave you fluids and insulin to treat this.We are discharging you on a new kind of insulin and metformin that you need to continue taking after you leave the hospital. We may have also made some adjustments to your other medications. Please, make sure to read the directions before you take them. The names and directions on how to take these medications are found on this discharge paper under medication section.  You also need a follow up with your primary care doctor. The address, date and time are found on the discharge paper under follow up section.  Below, you can find some reading about diabetic ketoacidosis.  Take care,  Diabetic Ketoacidosis Diabetic ketoacidosis is a life-threatening complication of diabetes. If it is not treated, it can cause severe dehydration and organ damage and can lead to a coma or death. CAUSES This condition develops when there is not enough of the hormone insulin in the body. Insulin helps the body to break down sugar for energy. Without insulin, the body cannot break down sugar, so it breaks down fats instead. This leads to the production of acids that are called ketones. Ketones are poisonous at high levels. This condition can be triggered by:  Stress on the body that is brought on by an illness.  Medicines that raise blood glucose levels.  Not taking diabetes medicine. SYMPTOMS Symptoms of this condition include:  Fatigue.  Weight loss.  Excessive thirst.  Light-headedness.  Fruity or sweet-smelling breath.  Excessive urination.  Vision changes.  Confusion or irritability.  Nausea.  Vomiting.  Rapid breathing.  Abdominal pain.  Feeling flushed. DIAGNOSIS This condition is diagnosed based on a medical history, a physical exam, and blood tests. You  may also have a urine test that checks for ketones. TREATMENT This condition may be treated with:  Fluid replacement. This may be done to correct dehydration.  Insulin injections. These may be given through the skin or through an IV tube.  Electrolyte replacement. Electrolytes, such as potassium and sodium, may be given in pill form or through an IV tube.  Antibiotic medicines. These may be prescribed if your condition was caused by an infection. HOME CARE INSTRUCTIONS Eating and Drinking  Drink enough fluids to keep your urine clear or pale yellow.  If you cannot eat, alternate between drinking fluids with sugar (such as juice) and salty fluids (such as broth or bouillon).  If you can eat, follow your usual diet and drink sugar-free liquids, such as water. Other Instructions  Take insulin as directed by your health care provider. Do not skip insulin injections. Do not use expired insulin.  If your blood sugar is over 240 mg/dL, monitor your urine ketones every 4-6 hours.  If you were prescribed an antibiotic medicine, finish all of it even if you start to feel better.  Rest and exercise only as directed by your health care provider.  If you get sick, call your health care provider and begin treatment quickly. Your body often needs extra insulin to fight an illness.  Check your blood glucose levels regularly. If your blood glucose is high, drink plenty of fluids. This helps to flush out ketones. SEEK MEDICAL CARE IF:  Your blood glucose level is too high or too low.  You have ketones in your urine.  You  have a fever.  You cannot eat.  You cannot tolerate fluids.  You have been vomiting for more than 2 hours.  You continue to have symptoms of this condition.  You develop new symptoms. SEEK IMMEDIATE MEDICAL CARE IF:  Your blood glucose levels continue to be high (elevated).  Your monitor reads "high" even when you are taking insulin.  You faint.  You have chest  pain.  You have trouble breathing.  You have a sudden, severe headache.  You have sudden weakness in one arm or one leg.  You have sudden trouble speaking or swallowing.  You have vomiting or diarrhea that gets worse after 3 hours.  You feel severely fatigued.  You have trouble thinking.  You have abdominal pain.  You are severely dehydrated. Symptoms of severe dehydration include:  Extreme thirst.  Dry mouth.  Blue lips.  Cold hands and feet.  Rapid breathing.   This information is not intended to replace advice given to you by your health care provider. Make sure you discuss any questions you have with your health care provider.   Document Released: 09/20/2000 Document Revised: 02/07/2015 Document Reviewed: 08/31/2014 Elsevier Interactive Patient Education [employer].

## 2015-10-18 NOTE — Progress Notes (Signed)
Family Medicine Teaching Service Daily Progress Note Intern Pager: [phone]  Patient name: Jeffrey [name] Medical record number: [id number] Date of birth: December 03, 1965 Age: 50 y.o. Gender: male  Primary Care Provider: Cordelia Poche, MD Consultants: none Code Status: full  Pt Overview and Major Events to Date:  01/09  Admitted with DKA  Assessment and Plan: Jeffrey [name] is a 50 y.o. male presenting as a transfer from [facility] with DKA in the setting of not taking his insulin for the last 5 days. PMH is significant for DM II, obesity, alcohol abuse, cardiac arrest, HTN, HLD, anxiety and OSA.  DKA/poorly controlled DM-II: A1C 9.5 (10.1 in 07/2015) from not taking his insulin: DKA resolved. AG closed. Was hypoglycemic to 44 overnight. Got dextrose and came back up to mid 200's.  -s/p insulin gtt -CBG ACHS -Increased Lantus to 10 units -Increase home gabapentin to 200 mg tid -Zofran as needed nausea -Diabetic teaching. Will to take NPH twice a day with metformin.  -Case management for medications. Discuss discharge meds.  Lactic acidosis: resolved. lactic acid 5.1>2.6>4.3>1.2. Due to DKA and alcoholism.  - CIWA-0   AKI: resolved. sCr 1.1. Baseline ~1. Likely from dehydration secondary to DKA. -resumed home losartan  Alcohol abuse: patient has recently been admitted for altered mental status likely related to acute alcohol intoxication. Patient was counseled against drinking at that time. Alcohol intake appears to be consistent with last admission. - CIWA as above  Proteinuria: urine protein >300. Has history of this. This concerning for nephrotic syndrome.  -Consider outpatient referral to nephrology -f/u SPEP/UPEP -further work up as outpt if not improving.  Hyperkalemia: resolved. K 6.3; 5.6 on arrival to [facility] ED. No EKG changes. Likely extracellular shift due to DKA - f/u BMP  Hypertension- mildly hypertensive this morning. On losartan 50 mg daily and Toprol 100 mg daily at  home. - Continue home Toprol and losartan  Melanotic stool: reports melanotic stool this morning. Hgb 12.6 better than his baseline -Protonix on discharge -check CBC at outpt  -Outpt GI referral  Vision issues: reports flashes of purple-red colors. Not new. Likely diabetic retinopathy. Denies pain. -Outpt opthalmology referral  OSA- History of refusing CPAP in past - Will order CPAP nightly.     FEN/GI: Carb modified diet Continue IVF Protonix  Prophylaxis: lovenox  Dispo: discharge home on NPH and metformin  Subjective:  Reports neuropathic pain in his feet. Otherwise, no complaint. Ate breakfast this morning. Discussed about trying pills for his diabetes and he is open to it.  Objective: Temp:  [98 F (36.7 C)-98.6 F (37 C)] 98.2 F (36.8 C) (01/11 0707) Pulse Rate:  [63-80] 80 (01/11 0707) Resp:  [11-20] 20 (01/11 0707) BP: (142-156)/(90-107) 142/95 mmHg (01/11 0707) SpO2:  [98 %-100 %] 100 % (01/11 0707) Physical Exam: Gen: appears well today. Oropharynx: clear, moist Neck: supple, no LAD CV: regular rate and rythm. S1 & S2 audible Resp: no apparent work of breathing, clear to auscultation bilaterally. GI: bowel sounds normal, no tenderness to palpation, no rebound or guarding, no mass.  Skin: no lesion MSK: exts without lesion, dorsalis pedis pulse 2+ bilaterally.  Laboratory:  Recent Labs Lab 10/15/15 1500  WBC 7.4  HGB 12.6*  HCT 38.1*  PLT 218    Recent Labs Lab 10/17/15 0248 10/17/15 0640 10/17/15 1645  NA 137 138 133*  K 3.5 3.9 3.9  CL 98* 97* 92*  CO2 28 28 27   BUN 10 10 8   CREATININE 1.03 1.13 1.05  CALCIUM 8.4* 8.4* 9.0  GLUCOSE 141* 215* 202*    Imaging/Diagnostic Tests:  Mercy Riding, MD 10/18/2015, 7:54 AM PGY-1, Miguel Barrera Intern pager: [phone], text pages welcome

## 2015-10-18 NOTE — Care Management Note (Signed)
Case Management Note  Patient Details  Name: Jeffrey [name] MRN: [id number] Date of Birth: [birth year]  Subjective/Objective:                  Date- 10-18-15 Initial Assessment  Spoke with patient at the bedside.  Introduced self as Tourist information centre manager and explained role in discharge planning and how to be reached.  Verified patient lives at home with spouse in [place].  Verified patient anticipates to go home with spouse  at time of discharge.  Patient has no DME. Expressed potential need for no other DME.  Patient confirmed  needing help with their medication. Patient has orange card, verified with [facility] that they will fill Novalin. Person I spoke with stated they will it with Humalin and they do not need an updated Rx, the Novalin Rx will work. A box of syringes will cost $6   Verified patient has PCP Nettey at [facility]. Patient states they currently receive [place] services through no one.    Plan: CM will continue to follow for discharge planning and [facility] resources.   Carles Collet RN BSN CM [phone]   Action/Plan:  DC to home self care. Patient informed to take all RX to [facility] to be filled at discharge.  Expected Discharge Date:                  Expected Discharge Plan:  Home/Self Care  In-House Referral:     Discharge planning Services  CM Consult  Post Acute Care Choice:    Choice offered to:     DME Arranged:    DME Agency:     HH Arranged:    [place] Agency:     Status of Service:  Completed, signed off  Medicare Important Message Given:    Date Medicare IM Given:    Medicare IM give by:    Date Additional Medicare IM Given:    Additional Medicare Important Message give by:     If discussed at [place] of Stay Meetings, dates discussed:    Additional Comments:  Carles Collet, RN 10/18/2015, 3:12 PM

## 2015-10-18 NOTE — Progress Notes (Signed)
CSW consulted for transportation needs. Patient reports that he has no one to give him a ride and no money with him. CSW provided bus pass.  CSW signing off.  Jeffrey [name] Jeffrey [name] LCSWA [phone]

## 2015-10-18 NOTE — Progress Notes (Signed)
Inpatient Diabetes Program Recommendations  AACE/ADA: New Consensus Statement on Inpatient Glycemic Control (2015)  Target Ranges:  Prepandial:   less than 140 mg/dL      Peak postprandial:   less than 180 mg/dL (1-2 hours)      Critically ill patients:  140 - 180 mg/dL   Review of Glycemic ControlResults for Jeffrey, [name] (MRN [id number]) as of 10/18/2015 15:20  Ref. Range 10/18/2015 03:39 10/18/2015 05:30 10/18/2015 07:56 10/18/2015 11:53  Glucose-Capillary Latest Ref Range: 65-99 mg/dL 244 (H) 255 (H) 208 (H) 216 (H)    Diabetes history: Type 2 diabetes/DKA Outpatient Diabetes medications: Lantus 20 units daily Current orders for Inpatient glycemic control:  Lantus 10 units daily, TCTS q 4 hours  Inpatient Diabetes Program Recommendations:    Note that patient is having a difficult time affording Lantus insulin.  He states that he has taken appropriate documentation to MAP however has not been approved?   Discussed use of NPH instead of Lantus.  Explained that NPH has to be given 2 times and day and does have a peak.  Encouraged him to eat at the same time each day and to not skip meals.  Also discussed hypoglycemia signs and symptoms and how to treat.  Patient was able to teach back appropriately.  No further questions at this time.  Thanks, Adah Perl, RN, BC-ADM Inpatient Diabetes Coordinator Pager [phone] (8a-5p)

## 2015-10-19 LAB — UIFE/LIGHT CHAINS/TP QN, 24-HR UR
% BETA, URINE: 7.6 %
ALBUMIN, U: 67.7 %
ALPHA 1 URINE: 4.8 %
ALPHA 2 UR: 5.1 %
FREE KAPPA/LAMBDA RATIO: 3.43 (ref 2.04–10.37)
FREE LAMBDA LT CHAINS, UR: 27.5 mg/L — AB (ref 0.24–6.66)
Free Lt Chn Excr Rate: 94.3 mg/L — ABNORMAL HIGH (ref 1.35–24.19)
GAMMA GLOBULIN URINE: 14.7 %
Total Protein, Urine: 38.2 mg/dL

## 2015-10-19 LAB — PROTEIN ELECTROPHORESIS, SERUM
A/G RATIO SPE: 0.9 (ref 0.7–1.7)
ALBUMIN ELP: 2.9 g/dL (ref 2.9–4.4)
Alpha-1-Globulin: 0.2 g/dL (ref 0.0–0.4)
Alpha-2-Globulin: 0.8 g/dL (ref 0.4–1.0)
BETA GLOBULIN: 1.3 g/dL (ref 0.7–1.3)
Gamma Globulin: 1 g/dL (ref 0.4–1.8)
Globulin, Total: 3.3 g/dL (ref 2.2–3.9)
TOTAL PROTEIN ELP: 6.2 g/dL (ref 6.0–8.5)

## 2015-10-22 ENCOUNTER — Telehealth: Payer: Self-pay | Admitting: Student

## 2015-10-22 ENCOUNTER — Encounter (HOSPITAL_COMMUNITY): Payer: Self-pay

## 2015-10-22 ENCOUNTER — Emergency Department (HOSPITAL_COMMUNITY)
Admission: EM | Admit: 2015-10-22 | Discharge: 2015-10-22 | Disposition: A | Discharge status: Home / Self Care | Payer: Self-pay | Attending: Emergency Medicine | Admitting: Emergency Medicine

## 2015-10-22 DIAGNOSIS — F10920 Alcohol use, unspecified with intoxication, uncomplicated: Secondary | ICD-10-CM

## 2015-10-22 DIAGNOSIS — Y9389 Activity, other specified: Secondary | ICD-10-CM | POA: Insufficient documentation

## 2015-10-22 DIAGNOSIS — I10 Essential (primary) hypertension: Secondary | ICD-10-CM | POA: Insufficient documentation

## 2015-10-22 DIAGNOSIS — Z7982 Long term (current) use of aspirin: Secondary | ICD-10-CM | POA: Insufficient documentation

## 2015-10-22 DIAGNOSIS — S90812A Abrasion, left foot, initial encounter: Secondary | ICD-10-CM | POA: Insufficient documentation

## 2015-10-22 DIAGNOSIS — Y9289 Other specified places as the place of occurrence of the external cause: Secondary | ICD-10-CM | POA: Insufficient documentation

## 2015-10-22 DIAGNOSIS — Z9981 Dependence on supplemental oxygen: Secondary | ICD-10-CM | POA: Insufficient documentation

## 2015-10-22 DIAGNOSIS — F419 Anxiety disorder, unspecified: Secondary | ICD-10-CM | POA: Insufficient documentation

## 2015-10-22 DIAGNOSIS — Z87448 Personal history of other diseases of urinary system: Secondary | ICD-10-CM | POA: Insufficient documentation

## 2015-10-22 DIAGNOSIS — K219 Gastro-esophageal reflux disease without esophagitis: Secondary | ICD-10-CM | POA: Insufficient documentation

## 2015-10-22 DIAGNOSIS — Y998 Other external cause status: Secondary | ICD-10-CM | POA: Insufficient documentation

## 2015-10-22 DIAGNOSIS — G4733 Obstructive sleep apnea (adult) (pediatric): Secondary | ICD-10-CM | POA: Insufficient documentation

## 2015-10-22 DIAGNOSIS — F10120 Alcohol abuse with intoxication, uncomplicated: Secondary | ICD-10-CM | POA: Insufficient documentation

## 2015-10-22 DIAGNOSIS — E1165 Type 2 diabetes mellitus with hyperglycemia: Secondary | ICD-10-CM | POA: Insufficient documentation

## 2015-10-22 DIAGNOSIS — R739 Hyperglycemia, unspecified: Secondary | ICD-10-CM

## 2015-10-22 DIAGNOSIS — W1839XA Other fall on same level, initial encounter: Secondary | ICD-10-CM | POA: Insufficient documentation

## 2015-10-22 DIAGNOSIS — Z794 Long term (current) use of insulin: Secondary | ICD-10-CM | POA: Insufficient documentation

## 2015-10-22 DIAGNOSIS — Z79899 Other long term (current) drug therapy: Secondary | ICD-10-CM | POA: Insufficient documentation

## 2015-10-22 DIAGNOSIS — Z8674 Personal history of sudden cardiac arrest: Secondary | ICD-10-CM | POA: Insufficient documentation

## 2015-10-22 DIAGNOSIS — Z7984 Long term (current) use of oral hypoglycemic drugs: Secondary | ICD-10-CM | POA: Insufficient documentation

## 2015-10-22 LAB — CBC WITH DIFFERENTIAL/PLATELET
BASOS PCT: 1 %
Basophils Absolute: 0.1 10*3/uL (ref 0.0–0.1)
EOS ABS: 0.1 10*3/uL (ref 0.0–0.7)
EOS PCT: 2 %
HCT: 36.6 % — ABNORMAL LOW (ref 39.0–52.0)
Hemoglobin: 12.3 g/dL — ABNORMAL LOW (ref 13.0–17.0)
LYMPHS ABS: 2.6 10*3/uL (ref 0.7–4.0)
Lymphocytes Relative: 38 %
MCH: 32 pg (ref 26.0–34.0)
MCHC: 33.6 g/dL (ref 30.0–36.0)
MCV: 95.3 fL (ref 78.0–100.0)
MONOS PCT: 21 %
Monocytes Absolute: 1.4 10*3/uL — ABNORMAL HIGH (ref 0.1–1.0)
NEUTROS PCT: 38 %
Neutro Abs: 2.6 10*3/uL (ref 1.7–7.7)
PLATELETS: 294 10*3/uL (ref 150–400)
RBC: 3.84 MIL/uL — ABNORMAL LOW (ref 4.22–5.81)
RDW: 16.8 % — AB (ref 11.5–15.5)
WBC: 6.7 10*3/uL (ref 4.0–10.5)

## 2015-10-22 LAB — COMPREHENSIVE METABOLIC PANEL
ALBUMIN: 3.9 g/dL (ref 3.5–5.0)
ALT: 58 U/L (ref 17–63)
ANION GAP: 19 — AB (ref 5–15)
AST: 69 U/L — ABNORMAL HIGH (ref 15–41)
Alkaline Phosphatase: 151 U/L — ABNORMAL HIGH (ref 38–126)
BUN: 8 mg/dL (ref 6–20)
CALCIUM: 9.1 mg/dL (ref 8.9–10.3)
CHLORIDE: 97 mmol/L — AB (ref 101–111)
CO2: 24 mmol/L (ref 22–32)
Creatinine, Ser: 1.04 mg/dL (ref 0.61–1.24)
GFR calc non Af Amer: >60 mL/min (ref >60)
GLUCOSE: 368 mg/dL — AB (ref 65–99)
POTASSIUM: 3.4 mmol/L — AB (ref 3.5–5.1)
SODIUM: 140 mmol/L (ref 135–145)
Total Bilirubin: 0.7 mg/dL (ref 0.3–1.2)
Total Protein: 7.8 g/dL (ref 6.5–8.1)

## 2015-10-22 LAB — CBG MONITORING, ED
GLUCOSE-CAPILLARY: 376 mg/dL — AB (ref 65–99)
Glucose-Capillary: 314 mg/dL — ABNORMAL HIGH (ref 65–99)

## 2015-10-22 LAB — ETHANOL: ALCOHOL ETHYL (B): 396 mg/dL — AB (ref <5)

## 2015-10-22 LAB — BLOOD GAS, VENOUS
ACID-BASE DEFICIT: 1.4 mmol/L (ref 0.0–2.0)
Bicarbonate: 23.6 mEq/L (ref 20.0–24.0)
FIO2: 0.21
O2 Saturation: 73 %
PCO2 VEN: 43.4 mmHg — AB (ref 45.0–50.0)
PH VEN: 7.355 — AB (ref 7.250–7.300)
Patient temperature: 98.6
TCO2: 21.6 mmol/L (ref 0–100)
pO2, Ven: 48 mmHg — ABNORMAL HIGH (ref 30.0–45.0)

## 2015-10-22 MED ORDER — INSULIN ASPART 100 UNIT/ML ~~LOC~~ SOLN
10.0000 [IU] | Freq: Once | SUBCUTANEOUS | Status: AC
  Administered 2015-10-22: 10 [IU] via INTRAVENOUS
  Filled 2015-10-22: qty 1

## 2015-10-22 MED ORDER — SODIUM CHLORIDE 0.9 % IV BOLUS (SEPSIS)
1000.0000 mL | Freq: Once | INTRAVENOUS | Status: AC
  Administered 2015-10-22: 1000 mL via INTRAVENOUS

## 2015-10-22 NOTE — ED Notes (Signed)
Patient states he does not have money to get prescriptions filled following discharge and his friends buys his alcohol. Patient is alert and oriented at this time.

## 2015-10-22 NOTE — Discharge Instructions (Signed)
Alcohol Intoxication  Alcohol intoxication occurs when you drink enough alcohol that it affects your ability to function. It can be mild or very severe. Drinking a lot of alcohol in a short time is called binge drinking. This can be very harmful. Drinking alcohol can also be more dangerous if you are taking medicines or other drugs. Some of the effects caused by alcohol may include:  · Loss of coordination.  · Changes in mood and behavior.  · Unclear thinking.  · Trouble talking (slurred speech).  · Throwing up (vomiting).  · Confusion.  · Slowed breathing.  · Twitching and shaking (seizures).  · Loss of consciousness.  HOME CARE  · Do not drive after drinking alcohol.  · Drink enough water and fluids to keep your pee (urine) clear or pale yellow. Avoid caffeine.  · Only take medicine as told by your doctor.  GET HELP IF:  · You throw up (vomit) many times.  · You do not feel better after a few days.  · You frequently have alcohol intoxication. Your doctor can help decide if you should see a substance use treatment counselor.  GET HELP RIGHT AWAY IF:  · You become shaky when you stop drinking.  · You have twitching and shaking.  · You throw up blood. It may look bright red or like coffee grounds.  · You notice blood in your poop (bowel movements).  · You become lightheaded or pass out (faint).  MAKE SURE YOU:   · Understand these instructions.  · Will watch your condition.  · Will get help right away if you are not doing well or get worse.     This information is not intended to replace advice given to you by your health care provider. Make sure you discuss any questions you have with your health care provider.     Document Released: 03/11/2008 Document Revised: 05/26/2013 Document Reviewed: 02/26/2013  Elsevier Interactive Patient Education ©2016 Elsevier Inc.

## 2015-10-22 NOTE — Telephone Encounter (Signed)
Pt's wife calls because he has fallen several times since discharge. His wife is unsure why he keeps falling,. He has fallen 3-4 times since discharge on 1/11. Denies headache, dizziness, fevers, N/V and he is not tripping on anything. He has an appointment with his PCP on 1/17. Pt will follow up at this time but if there is further worry or a change, she will call again or consider evaluation in the ED  Madlyn Crosby A. Lincoln Brigham MD, [place] [facility] Resident PGY-2 Pager [phone]

## 2015-10-22 NOTE — Care Management Note (Addendum)
Case Management Note  Patient Details  Name: Jeffrey [name] MRN: [id number] Date of Birth: 1966/02/23  Subjective/Objective:                  Hyperglycemia, fall, alcohol intoxication  Action/Plan: CM spoke with patient in the [place]. Patient states he has an orange card and is able to afford his oral medications. He cannot afford Lantus. He is aware of the availability of insulin at [facility] for 24.98. Reports he discussed this with the physician during his previous admission but was not changed to 70/30 which he can get at [facility] for 24.98. His oral medications are $6 each. He has applied for Medicaid. He does not work, states he and his wife use SSI funds they receive for their disabled children. Provided patient with the application for [employer], the Patient Assistance Program for Lantus and a GoodRx card. Encouraged to have his PCP assist him with completing the applications for medication assistance.   Expected Discharge Date:                  Expected Discharge Plan:     In-House Referral:     Discharge planning Services     Post Acute Care Choice:    Choice offered to:     DME Arranged:    DME Agency:     HH Arranged:    [place] Agency:     Status of Service:     Medicare Important Message Given:    Date Medicare IM Given:    Medicare IM give by:    Date Additional Medicare IM Given:    Additional Medicare Important Message give by:     If discussed at [place] of Stay Meetings, dates discussed:    Additional Comments:  Apolonio Schneiders, RN 10/22/2015, 4:04 PM

## 2015-10-22 NOTE — ED Provider Notes (Signed)
CSN: [id number]     Arrival date & time 10/22/15  1217 History   First MD Initiated Contact with Patient 10/22/15 1238     Chief Complaint  Patient presents with  . Hyperglycemia  . Alcohol Intoxication  . Fall     (Consider location/radiation/quality/duration/timing/severity/associated sxs/prior Treatment) HPI Comments: Patient is a 50 year old male with recent admission to the hospital with discharged 2 days ago after DKA as patient had not used his insulin for 5 days. He states since getting out of the hospital he has not been able to fill the insulin prescriptions so has not used any insulin in the last 2 days. Last night when he was watching the football game he admits to drinking heavily and his wife told him he passed out twice one time hitting his foot on the floor. He complains of mild pain in the left foot but states he just doesn't feel good. He denies abdominal pain, headache, chest pain, shortness of breath, nausea or vomiting. ET just wants to be checked out to make sure everything is okay.  Patient is a 50 y.o. male presenting with hyperglycemia, intoxication, and fall. The history is provided by the patient.  Hyperglycemia Alcohol Intoxication  Fall    Past Medical History  Diagnosis Date  . Hypertension   . Blood transfusion 2014    "related to bleeding ulcer"  . Venous insufficiency 12/13/2011  . Cardiac arrest ([place]) 12/07/2012    Anoxic encephalopathy  . Anxiety   . Acute renal insufficiency 12/08/2012  . High cholesterol   . OSA on CPAP 12/07/2012  . Type II diabetes mellitus ([place])   . Bleeding ulcer 2014  . GERD (gastroesophageal reflux disease)   . Anoxic encephalopathy [facility])    Past Surgical History  Procedure Laterality Date  . Knee arthroscopy Left ~ 1999  . Esophagogastroduodenoscopy Left 11/26/2012    Procedure: ESOPHAGOGASTRODUODENOSCOPY (EGD);  Surgeon: Wonda Horner, MD;  Location: [facility] ENDOSCOPY;  Service: Endoscopy;  Laterality: Left;  . Cardiovascular  stress test  03/16/13    Very Poor Exercise Tolerance; NON DIAGNOSTIC TEST  . Cataract extraction w/ intraocular lens implant Left 03/07/2014    [facility] @ [facility]  . Esophagogastroduodenoscopy N/A 10/10/2014    Procedure: ESOPHAGOGASTRODUODENOSCOPY (EGD);  Surgeon: Ladene Artist, MD;  Location: [facility] ENDOSCOPY;  Service: Endoscopy;  Laterality: N/A;   No family history on file. Social History  Substance Use Topics  . Smoking status: Never Smoker   . Smokeless tobacco: Current User    Types: Snuff  . Alcohol Use: 3.6 oz/week    6 Cans of beer per week    Review of Systems  All other systems reviewed and are negative.     Allergies  Lisinopril  Home Medications   Prior to Admission medications   Medication Sig Start Date End Date Taking? Authorizing Provider  aspirin EC 81 MG tablet Take 81 mg by mouth daily.   Yes Historical Provider, MD  citalopram (CELEXA) 20 MG tablet Take 1 tablet (20 mg total) by mouth daily. 05/22/15  Yes Mariel Aloe, MD  folic acid (FOLVITE) 1 MG tablet Take 1 tablet (1 mg total) by mouth daily. 03/12/15  Yes Virginia Crews, MD  gabapentin (NEURONTIN) 300 MG capsule Take 1 capsule (300 mg total) by mouth 3 (three) times daily. 10/18/15  Yes Sela Hua, MD  insulin NPH Human (NOVOLIN N) 100 UNIT/ML injection Inject 13 units right before breakfast and 7 units right before  dinner. Inject under your skin. Patient taking differently: Inject 7-13 Units into the skin 2 (two) times daily before a meal. Inject 13 units right before breakfast and 7 units right before dinner. Inject under your skin. 10/18/15  Yes Mercy Riding, MD  Insulin Syringes, Disposable, U-100 0.3 ML MISC Use one to inject insulin twice daily 10/18/15  Yes Mercy Riding, MD  lactulose (CHRONULAC) 10 GM/15ML solution Take 15 mLs (10 g total) by mouth daily. 10/18/15  Yes Sela Hua, MD  LORazepam (ATIVAN) 0.5 MG tablet Take 1 tablet (0.5 mg total) by mouth every 8 (eight) hours as  needed for anxiety or seizure. 10/18/15  Yes Sela Hua, MD  losartan (COZAAR) 50 MG tablet Take 1 tablet (50 mg total) by mouth daily. 05/22/15  Yes Mariel Aloe, MD  metFORMIN (GLUCOPHAGE) 500 MG tablet Take 1 tablet (500 mg total) by mouth 2 (two) times daily with a meal. 10/18/15  Yes Mercy Riding, MD  metoprolol succinate (TOPROL-XL) 100 MG 24 hr tablet TAKE ONE TABLET BY MOUTH DAILY. TAKE WITH OR IMMEDIATELY FOLLOWING A MEAL Patient taking differently: Take 100 mg by mouth at bedtime.  08/25/15  Yes Mariel Aloe, MD  Multiple Vitamin (MULTIVITAMIN WITH MINERALS) TABS tablet Take 1 tablet by mouth daily.   Yes Historical Provider, MD  omeprazole (PRILOSEC) 20 MG capsule Take 1 capsule (20 mg total) by mouth 2 (two) times daily before a meal. 03/12/15  Yes Virginia Crews, MD  polyvinyl alcohol (LIQUIFILM TEARS) 1.4 % ophthalmic solution Place 1 drop into both eyes every 4 (four) hours as needed for dry eyes.   Yes Historical Provider, MD  traMADol (ULTRAM) 50 MG tablet Take 1 tablet (50 mg total) by mouth every 12 (twelve) hours as needed for severe pain. Patient not taking: Reported on 09/12/2015 03/12/15   Virginia Crews, MD   BP 118/78 mmHg  Pulse 74  Temp(Src) 98.6 F (37 C) (Oral)  Resp 20  SpO2 100% Physical Exam  Constitutional: He is oriented to person, place, and time. He appears well-developed and well-nourished. No distress.  Smells of alcohol and mildly intoxicated but appropriate and able to answer questions correctly  HENT:  Head: Normocephalic and atraumatic.  Mouth/Throat: Oropharynx is clear and moist.  Eyes: Conjunctivae and EOM are normal. Pupils are equal, round, and reactive to light.  Neck: Normal range of motion. Neck supple.  Cardiovascular: Normal rate, regular rhythm and intact distal pulses.   No murmur heard. Pulmonary/Chest: Effort normal and breath sounds normal. No respiratory distress. He has no wheezes. He has no rales.  Abdominal: Soft. He  exhibits no distension. There is no tenderness. There is no rebound and no guarding.  Musculoskeletal: Normal range of motion. He exhibits no edema or tenderness.  Small abrasion over the left latera foot wihtout swelling, ecchymosis or edema.  Full ROM with low suspicion for underlying injury  Neurological: He is alert and oriented to person, place, and time.  Skin: Skin is warm and dry. No rash noted. No erythema.  Psychiatric: He has a normal mood and affect. His behavior is normal.  Nursing note and vitals reviewed.   ED Course  Procedures (including critical care time) Labs Review Labs Reviewed  CBC WITH DIFFERENTIAL/PLATELET - Abnormal; Notable for the following:    RBC 3.84 (*)    Hemoglobin 12.3 (*)    HCT 36.6 (*)    RDW 16.8 (*)    Monocytes Absolute 1.4 (*)  All other components within normal limits  COMPREHENSIVE METABOLIC PANEL - Abnormal; Notable for the following:    Potassium 3.4 (*)    Chloride 97 (*)    Glucose, Bld 368 (*)    AST 69 (*)    Alkaline Phosphatase 151 (*)    Anion gap 19 (*)    All other components within normal limits  BLOOD GAS, VENOUS - Abnormal; Notable for the following:    pH, Ven 7.355 (*)    pCO2, Ven 43.4 (*)    pO2, Ven 48.0 (*)    All other components within normal limits  ETHANOL - Abnormal; Notable for the following:    Alcohol, Ethyl (B) 396 (*)    All other components within normal limits  CBG MONITORING, ED - Abnormal; Notable for the following:    Glucose-Capillary 314 (*)    All other components within normal limits    Imaging Review No results found. I have personally reviewed and evaluated these images and lab results as part of my medical decision-making.   EKG Interpretation   Date/Time:  Sunday October 22 2015 12:27:03 EST Ventricular Rate:  74 PR Interval:  174 QRS Duration: 126 QT Interval:  435 QTC Calculation: 483 R Axis:   30 Text Interpretation:  Sinus rhythm Nonspecific ST abnormality No   significant change since last tracing Confirmed by Maryan Rued  MD, Loree Fee  [phone]) on 10/22/2015 1:47:56 PM      MDM   Final diagnoses:  Hyperglycemia  Alcohol intoxication, uncomplicated ([place])    Patient is a 50 year old male with multiple medical problems including diabetes, prior cardiac arrest with anoxic encephalopathy, bleeding ulcers who was recently admitted to the hospital for DKA and discharged 2 days ago who presents to the emergency room again today because since discharge he has not taken any insulin he drank an excessive amount of alcohol last night while he was watching the football game and had 2 episodes of passing out. He states he just doesn't feel good today but denies any nausea or vomiting. He has some mild pain on his foot after falling but denies headache and is otherwise acting appropriately. Patient smells of alcohol and does admit to drinking today as well.  There is no other sign of injury. Lower suspicion for DKA today the patient is hyperglycemic and 300s. CBC without acute findings. VBG without evidence of DKA. CMP and EtOH pending.  EKG unchanged from prior.  Will hydrate patient and treat with insulin.  2:24 PM Patient's alcohol level was 390, blood sugars in the 300s and after IV fluids blood sugar is upper 300's but pt also ate.  Will treat with insulin.  Rest of the labs are unchanged from prior. A she has responsible family members present at bedside that can take him home.  Blanchie Dessert, MD 10/22/15 [phone]

## 2015-10-22 NOTE — ED Notes (Signed)
Bed: [id number] Expected date:  Expected time:  Means of arrival:  Comments: Res b

## 2015-10-22 NOTE — ED Notes (Signed)
He states he "have been drinking all night; and my wife called EMS when I fell this morning".  His only c/o from the fall is of left distal foot area pain at which he has an abrasion and minimal redness.  Movement of his left #1 m.t.p. joint is exquisitely painful.  His speech is slurred, as one who is intoxicated and he is oriented to his situation and recalls being seen at our facility last week, of which we confirm the veracity.  He is in no distress.

## 2015-10-22 NOTE — ED Notes (Signed)
Case manager at bedside to provide resources to obtain medications upon discharge.

## 2015-10-22 NOTE — ED Notes (Signed)
Bed: RESB Expected date:  Expected time:  Means of arrival:  Comments: Ems-etoh/ hyperglycemic

## 2015-10-24 ENCOUNTER — Inpatient Hospital Stay: Payer: Self-pay | Admitting: Family Medicine

## 2015-10-27 NOTE — Telephone Encounter (Signed)
Please call [facility] pharmacy regarding changing metoprolol succ to metoprolol tartrate.  Please call [phone].  Derl Barrow, RN

## 2015-10-28 ENCOUNTER — Telehealth: Payer: Self-pay | Admitting: Family Medicine

## 2015-10-28 NOTE — Telephone Encounter (Signed)
Pt called because he says that he was given an insulin prescription in the hospital several weeks ago. He has not filled this prescription because he took it to [facility] and the cost was going to be $160. He says he does not have that kind of money. Upon further questioning, the patient stated that he is part of the "[employer] in [place], but had not gone to the [place]. I stated that he would be able to get his insulin there for little to nothing. He says he did not think of this before. He will go tomorrow and pick up his insulin. Most recent blood sugar was near 430. I recommended that if his blood sugar continued to rise and he began to feel worse, then he should come in to the ED. Pt. Agreed.   Paula Compton, MD Family Medicine - PGY 2

## 2015-11-04 ENCOUNTER — Encounter (HOSPITAL_COMMUNITY): Payer: Self-pay

## 2015-11-04 ENCOUNTER — Inpatient Hospital Stay (HOSPITAL_COMMUNITY)
Admission: EM | Admit: 2015-11-04 | Discharge: 2015-11-08 | DRG: 638 | Disposition: A | Discharge status: Home / Self Care | Payer: Self-pay | Attending: Family Medicine | Admitting: Family Medicine

## 2015-11-04 ENCOUNTER — Emergency Department (HOSPITAL_COMMUNITY): Payer: Self-pay

## 2015-11-04 DIAGNOSIS — E860 Dehydration: Secondary | ICD-10-CM | POA: Diagnosis present

## 2015-11-04 DIAGNOSIS — Z888 Allergy status to other drugs, medicaments and biological substances status: Secondary | ICD-10-CM

## 2015-11-04 DIAGNOSIS — Z6829 Body mass index (BMI) 29.0-29.9, adult: Secondary | ICD-10-CM

## 2015-11-04 DIAGNOSIS — Z7982 Long term (current) use of aspirin: Secondary | ICD-10-CM

## 2015-11-04 DIAGNOSIS — E876 Hypokalemia: Secondary | ICD-10-CM | POA: Diagnosis not present

## 2015-11-04 DIAGNOSIS — Z720 Tobacco use: Secondary | ICD-10-CM

## 2015-11-04 DIAGNOSIS — Z7984 Long term (current) use of oral hypoglycemic drugs: Secondary | ICD-10-CM

## 2015-11-04 DIAGNOSIS — T65891A Toxic effect of other specified substances, accidental (unintentional), initial encounter: Secondary | ICD-10-CM | POA: Diagnosis present

## 2015-11-04 DIAGNOSIS — R109 Unspecified abdominal pain: Secondary | ICD-10-CM | POA: Diagnosis present

## 2015-11-04 DIAGNOSIS — E1110 Type 2 diabetes mellitus with ketoacidosis without coma: Secondary | ICD-10-CM | POA: Diagnosis present

## 2015-11-04 DIAGNOSIS — E1310 Other specified diabetes mellitus with ketoacidosis without coma: Principal | ICD-10-CM | POA: Diagnosis present

## 2015-11-04 DIAGNOSIS — K219 Gastro-esophageal reflux disease without esophagitis: Secondary | ICD-10-CM | POA: Diagnosis present

## 2015-11-04 DIAGNOSIS — Y92009 Unspecified place in unspecified non-institutional (private) residence as the place of occurrence of the external cause: Secondary | ICD-10-CM

## 2015-11-04 DIAGNOSIS — T383X6A Underdosing of insulin and oral hypoglycemic [antidiabetic] drugs, initial encounter: Secondary | ICD-10-CM | POA: Diagnosis present

## 2015-11-04 DIAGNOSIS — R809 Proteinuria, unspecified: Secondary | ICD-10-CM | POA: Diagnosis present

## 2015-11-04 DIAGNOSIS — N179 Acute kidney failure, unspecified: Secondary | ICD-10-CM | POA: Diagnosis present

## 2015-11-04 DIAGNOSIS — Z91120 Patient's intentional underdosing of medication regimen due to financial hardship: Secondary | ICD-10-CM

## 2015-11-04 DIAGNOSIS — E669 Obesity, unspecified: Secondary | ICD-10-CM | POA: Diagnosis present

## 2015-11-04 DIAGNOSIS — I10 Essential (primary) hypertension: Secondary | ICD-10-CM | POA: Diagnosis present

## 2015-11-04 DIAGNOSIS — F1010 Alcohol abuse, uncomplicated: Secondary | ICD-10-CM

## 2015-11-04 DIAGNOSIS — Z794 Long term (current) use of insulin: Secondary | ICD-10-CM

## 2015-11-04 DIAGNOSIS — F419 Anxiety disorder, unspecified: Secondary | ICD-10-CM | POA: Diagnosis present

## 2015-11-04 DIAGNOSIS — K920 Hematemesis: Secondary | ICD-10-CM | POA: Diagnosis present

## 2015-11-04 DIAGNOSIS — T5791XA Toxic effect of unspecified inorganic substance, accidental (unintentional), initial encounter: Secondary | ICD-10-CM

## 2015-11-04 DIAGNOSIS — K7460 Unspecified cirrhosis of liver: Secondary | ICD-10-CM | POA: Diagnosis present

## 2015-11-04 DIAGNOSIS — E785 Hyperlipidemia, unspecified: Secondary | ICD-10-CM | POA: Diagnosis present

## 2015-11-04 DIAGNOSIS — Z8674 Personal history of sudden cardiac arrest: Secondary | ICD-10-CM

## 2015-11-04 DIAGNOSIS — F1020 Alcohol dependence, uncomplicated: Secondary | ICD-10-CM | POA: Diagnosis present

## 2015-11-04 DIAGNOSIS — T6591XA Toxic effect of unspecified substance, accidental (unintentional), initial encounter: Secondary | ICD-10-CM | POA: Diagnosis present

## 2015-11-04 DIAGNOSIS — G4733 Obstructive sleep apnea (adult) (pediatric): Secondary | ICD-10-CM | POA: Diagnosis present

## 2015-11-04 DIAGNOSIS — Z79899 Other long term (current) drug therapy: Secondary | ICD-10-CM

## 2015-11-04 DIAGNOSIS — K760 Fatty (change of) liver, not elsewhere classified: Secondary | ICD-10-CM | POA: Diagnosis present

## 2015-11-04 LAB — RAPID URINE DRUG SCREEN, HOSP PERFORMED
Amphetamines: NOT DETECTED
BARBITURATES: NOT DETECTED
BENZODIAZEPINES: POSITIVE — AB
COCAINE: NOT DETECTED
Opiates: NOT DETECTED
Tetrahydrocannabinol: NOT DETECTED

## 2015-11-04 LAB — BASIC METABOLIC PANEL
Anion gap: 35 — ABNORMAL HIGH (ref 5–15)
Anion gap: 25 — ABNORMAL HIGH (ref 5–15)
Anion gap: 23 — ABNORMAL HIGH (ref 5–15)
BUN: 10 mg/dL (ref 6–20)
BUN: 9 mg/dL (ref 6–20)
BUN: 9 mg/dL (ref 6–20)
CALCIUM: 8.8 mg/dL — AB (ref 8.9–10.3)
CALCIUM: 8.7 mg/dL — AB (ref 8.9–10.3)
CALCIUM: 8.8 mg/dL — AB (ref 8.9–10.3)
CHLORIDE: 91 mmol/L — AB (ref 101–111)
CO2: 13 mmol/L — ABNORMAL LOW (ref 22–32)
CO2: 19 mmol/L — ABNORMAL LOW (ref 22–32)
CO2: 23 mmol/L (ref 22–32)
CREATININE: 1.9 mg/dL — AB (ref 0.61–1.24)
CREATININE: 1.89 mg/dL — AB (ref 0.61–1.24)
CREATININE: 1.52 mg/dL — AB (ref 0.61–1.24)
Chloride: 85 mmol/L — ABNORMAL LOW (ref 101–111)
Chloride: 92 mmol/L — ABNORMAL LOW (ref 101–111)
GFR calc Af Amer: 60 mL/min — ABNORMAL LOW (ref >60)
GFR, EST AFRICAN AMERICAN: 46 mL/min — AB (ref >60)
GFR, EST AFRICAN AMERICAN: 46 mL/min — AB (ref >60)
GFR, EST NON AFRICAN AMERICAN: 39 mL/min — AB (ref >60)
GFR, EST NON AFRICAN AMERICAN: 40 mL/min — AB (ref >60)
GFR, EST NON AFRICAN AMERICAN: 51 mL/min — AB (ref >60)
Glucose, Bld: 370 mg/dL — ABNORMAL HIGH (ref 65–99)
Glucose, Bld: 191 mg/dL — ABNORMAL HIGH (ref 65–99)
Glucose, Bld: 162 mg/dL — ABNORMAL HIGH (ref 65–99)
Potassium: 4.9 mmol/L (ref 3.5–5.1)
Potassium: 3.7 mmol/L (ref 3.5–5.1)
Potassium: 3.6 mmol/L (ref 3.5–5.1)
SODIUM: 133 mmol/L — AB (ref 135–145)
SODIUM: 136 mmol/L (ref 135–145)
SODIUM: 137 mmol/L (ref 135–145)

## 2015-11-04 LAB — I-STAT VENOUS BLOOD GAS, ED
ACID-BASE DEFICIT: 3 mmol/L — AB (ref 0.0–2.0)
BICARBONATE: 21.7 meq/L (ref 20.0–24.0)
O2 SAT: 69 %
PCO2 VEN: 36.4 mmHg — AB (ref 45.0–50.0)
PH VEN: 7.383 — AB (ref 7.250–7.300)
PO2 VEN: 36 mmHg (ref 30.0–45.0)
TCO2: 23 mmol/L (ref 0–100)

## 2015-11-04 LAB — URINALYSIS, ROUTINE W REFLEX MICROSCOPIC
Glucose, UA: >1000 mg/dL — AB
LEUKOCYTES UA: NEGATIVE
NITRITE: NEGATIVE
PROTEIN: 100 mg/dL — AB
Specific Gravity, Urine: 1.03 (ref 1.005–1.030)
pH: 5 (ref 5.0–8.0)

## 2015-11-04 LAB — CBC WITH DIFFERENTIAL/PLATELET
BASOS ABS: 0.1 10*3/uL (ref 0.0–0.1)
Basophils Relative: 1 %
EOS ABS: 0.1 10*3/uL (ref 0.0–0.7)
EOS PCT: 1 %
HCT: 43.4 % (ref 39.0–52.0)
HEMOGLOBIN: 14.7 g/dL (ref 13.0–17.0)
LYMPHS PCT: 14 %
Lymphs Abs: 1.5 10*3/uL (ref 0.7–4.0)
MCH: 32.6 pg (ref 26.0–34.0)
MCHC: 33.9 g/dL (ref 30.0–36.0)
MCV: 96.2 fL (ref 78.0–100.0)
Monocytes Absolute: 0.6 10*3/uL (ref 0.1–1.0)
Monocytes Relative: 5 %
NEUTROS PCT: 79 %
Neutro Abs: 8.6 10*3/uL — ABNORMAL HIGH (ref 1.7–7.7)
Platelets: 228 10*3/uL (ref 150–400)
RBC: 4.51 MIL/uL (ref 4.22–5.81)
RDW: 15.7 % — ABNORMAL HIGH (ref 11.5–15.5)
WBC: 10.8 10*3/uL — AB (ref 4.0–10.5)

## 2015-11-04 LAB — COMPREHENSIVE METABOLIC PANEL
ALBUMIN: 3.8 g/dL (ref 3.5–5.0)
ALK PHOS: 160 U/L — AB (ref 38–126)
ALT: 39 U/L (ref 17–63)
ANION GAP: 32 — AB (ref 5–15)
AST: 63 U/L — ABNORMAL HIGH (ref 15–41)
BUN: 9 mg/dL (ref 6–20)
CALCIUM: 9.1 mg/dL (ref 8.9–10.3)
CO2: 18 mmol/L — AB (ref 22–32)
Chloride: 84 mmol/L — ABNORMAL LOW (ref 101–111)
Creatinine, Ser: 1.57 mg/dL — ABNORMAL HIGH (ref 0.61–1.24)
GFR calc non Af Amer: 49 mL/min — ABNORMAL LOW (ref >60)
GFR, EST AFRICAN AMERICAN: 57 mL/min — AB (ref >60)
GLUCOSE: 395 mg/dL — AB (ref 65–99)
POTASSIUM: 4.5 mmol/L (ref 3.5–5.1)
SODIUM: 134 mmol/L — AB (ref 135–145)
TOTAL PROTEIN: 8.2 g/dL — AB (ref 6.5–8.1)
Total Bilirubin: 1.5 mg/dL — ABNORMAL HIGH (ref 0.3–1.2)

## 2015-11-04 LAB — PROTIME-INR
INR: 1.1 (ref 0.00–1.49)
Prothrombin Time: 14.4 seconds (ref 11.6–15.2)

## 2015-11-04 LAB — I-STAT CG4 LACTIC ACID, ED: LACTIC ACID, VENOUS: 3.73 mmol/L — AB (ref 0.5–2.0)

## 2015-11-04 LAB — GLUCOSE, CAPILLARY
GLUCOSE-CAPILLARY: 143 mg/dL — AB (ref 65–99)
GLUCOSE-CAPILLARY: 152 mg/dL — AB (ref 65–99)
GLUCOSE-CAPILLARY: 162 mg/dL — AB (ref 65–99)
GLUCOSE-CAPILLARY: 173 mg/dL — AB (ref 65–99)
GLUCOSE-CAPILLARY: 189 mg/dL — AB (ref 65–99)
GLUCOSE-CAPILLARY: 351 mg/dL — AB (ref 65–99)
Glucose-Capillary: 305 mg/dL — ABNORMAL HIGH (ref 65–99)
Glucose-Capillary: 155 mg/dL — ABNORMAL HIGH (ref 65–99)
Glucose-Capillary: 132 mg/dL — ABNORMAL HIGH (ref 65–99)
Glucose-Capillary: 146 mg/dL — ABNORMAL HIGH (ref 65–99)

## 2015-11-04 LAB — LACTIC ACID, PLASMA
Lactic Acid, Venous: 1.7 mmol/L (ref 0.5–2.0)
Lactic Acid, Venous: 1.4 mmol/L (ref 0.5–2.0)

## 2015-11-04 LAB — URINE MICROSCOPIC-ADD ON

## 2015-11-04 LAB — MRSA PCR SCREENING: MRSA BY PCR: POSITIVE — AB

## 2015-11-04 LAB — ETHANOL: Alcohol, Ethyl (B): <5 mg/dL (ref <5)

## 2015-11-04 LAB — LIPASE, BLOOD: LIPASE: 15 U/L (ref 11–51)

## 2015-11-04 MED ORDER — VITAMIN B-1 100 MG PO TABS
100.0000 mg | ORAL_TABLET | Freq: Every day | ORAL | Status: DC
  Administered 2015-11-04 – 2015-11-08 (×5): 100 mg via ORAL
  Filled 2015-11-04 (×5): qty 1

## 2015-11-04 MED ORDER — LORAZEPAM 2 MG/ML IJ SOLN
1.0000 mg | Freq: Four times a day (QID) | INTRAMUSCULAR | Status: AC | PRN
  Administered 2015-11-06 – 2015-11-07 (×3): 1 mg via INTRAVENOUS
  Filled 2015-11-04 (×3): qty 1

## 2015-11-04 MED ORDER — FOLIC ACID 1 MG PO TABS
1.0000 mg | ORAL_TABLET | Freq: Every day | ORAL | Status: DC
  Administered 2015-11-04 – 2015-11-08 (×5): 1 mg via ORAL
  Filled 2015-11-04 (×5): qty 1

## 2015-11-04 MED ORDER — SODIUM CHLORIDE 0.9% FLUSH
3.0000 mL | Freq: Two times a day (BID) | INTRAVENOUS | Status: DC
  Administered 2015-11-04 – 2015-11-08 (×9): 3 mL via INTRAVENOUS

## 2015-11-04 MED ORDER — MUPIROCIN 2 % EX OINT
1.0000 "application " | TOPICAL_OINTMENT | Freq: Two times a day (BID) | CUTANEOUS | Status: DC
  Administered 2015-11-05 – 2015-11-08 (×8): 1 via NASAL
  Filled 2015-11-04 (×2): qty 22

## 2015-11-04 MED ORDER — DEXTROSE-NACL 5-0.45 % IV SOLN
INTRAVENOUS | Status: DC
  Administered 2015-11-04: 16:00:00 via INTRAVENOUS

## 2015-11-04 MED ORDER — HEPARIN SODIUM (PORCINE) 5000 UNIT/ML IJ SOLN
5000.0000 [IU] | Freq: Three times a day (TID) | INTRAMUSCULAR | Status: DC
  Administered 2015-11-04 – 2015-11-08 (×12): 5000 [IU] via SUBCUTANEOUS
  Filled 2015-11-04 (×11): qty 1

## 2015-11-04 MED ORDER — CHLORHEXIDINE GLUCONATE CLOTH 2 % EX PADS
6.0000 | MEDICATED_PAD | Freq: Every day | CUTANEOUS | Status: DC
Start: 2015-11-05 — End: 2015-11-08
  Administered 2015-11-05 – 2015-11-08 (×3): 6 via TOPICAL

## 2015-11-04 MED ORDER — THIAMINE HCL 100 MG/ML IJ SOLN: 100.0000 mg | Freq: Every day | INTRAMUSCULAR | Status: DC

## 2015-11-04 MED ORDER — INSULIN REGULAR BOLUS VIA INFUSION
0.0000 [IU] | Freq: Three times a day (TID) | INTRAVENOUS | Status: DC
  Filled 2015-11-04: qty 10

## 2015-11-04 MED ORDER — GABAPENTIN 300 MG PO CAPS
300.0000 mg | ORAL_CAPSULE | Freq: Three times a day (TID) | ORAL | Status: DC
  Administered 2015-11-04 – 2015-11-08 (×13): 300 mg via ORAL
  Filled 2015-11-04 (×13): qty 1

## 2015-11-04 MED ORDER — ACETAMINOPHEN 325 MG PO TABS: 650.0000 mg | ORAL_TABLET | Freq: Four times a day (QID) | ORAL | Status: DC | PRN

## 2015-11-04 MED ORDER — LORAZEPAM 1 MG PO TABS
1.0000 mg | ORAL_TABLET | Freq: Four times a day (QID) | ORAL | Status: AC | PRN
  Administered 2015-11-04 – 2015-11-07 (×5): 1 mg via ORAL
  Filled 2015-11-04 (×5): qty 1

## 2015-11-04 MED ORDER — CITALOPRAM HYDROBROMIDE 20 MG PO TABS
20.0000 mg | ORAL_TABLET | Freq: Every day | ORAL | Status: DC
  Administered 2015-11-04 – 2015-11-08 (×5): 20 mg via ORAL
  Filled 2015-11-04: qty 2
  Filled 2015-11-04 (×4): qty 1

## 2015-11-04 MED ORDER — ADULT MULTIVITAMIN W/MINERALS CH
1.0000 | ORAL_TABLET | Freq: Every day | ORAL | Status: DC
  Administered 2015-11-04 – 2015-11-08 (×5): 1 via ORAL
  Filled 2015-11-04 (×5): qty 1

## 2015-11-04 MED ORDER — SODIUM CHLORIDE 0.9 % IV SOLN: 1000.0000 mL | INTRAVENOUS | Status: DC

## 2015-11-04 MED ORDER — ACETAMINOPHEN 650 MG RE SUPP: 650.0000 mg | Freq: Four times a day (QID) | RECTAL | Status: DC | PRN

## 2015-11-04 MED ORDER — SODIUM CHLORIDE 0.9 % IV SOLN
INTRAVENOUS | Status: DC
  Administered 2015-11-04: 2.9 [IU]/h via INTRAVENOUS
  Filled 2015-11-04: qty 2.5

## 2015-11-04 MED ORDER — DEXTROSE 50 % IV SOLN: 25.0000 mL | INTRAVENOUS | Status: DC | PRN

## 2015-11-04 MED ORDER — LACTULOSE 10 GM/15ML PO SOLN
10.0000 g | Freq: Every day | ORAL | Status: DC
  Administered 2015-11-04 – 2015-11-08 (×3): 10 g via ORAL
  Filled 2015-11-04 (×4): qty 15

## 2015-11-04 MED ORDER — METOPROLOL SUCCINATE ER 100 MG PO TB24
100.0000 mg | ORAL_TABLET | Freq: Every day | ORAL | Status: DC
  Administered 2015-11-04 – 2015-11-07 (×4): 100 mg via ORAL
  Filled 2015-11-04 (×2): qty 2
  Filled 2015-11-04: qty 1
  Filled 2015-11-04: qty 2

## 2015-11-04 MED ORDER — PANTOPRAZOLE SODIUM 40 MG PO TBEC
40.0000 mg | DELAYED_RELEASE_TABLET | Freq: Every day | ORAL | Status: DC
  Administered 2015-11-04 – 2015-11-08 (×5): 40 mg via ORAL
  Filled 2015-11-04 (×5): qty 1

## 2015-11-04 MED ORDER — SODIUM CHLORIDE 0.9 % IV SOLN
1000.0000 mL | Freq: Once | INTRAVENOUS | Status: AC
  Administered 2015-11-04: 1000 mL via INTRAVENOUS

## 2015-11-04 MED ORDER — SODIUM CHLORIDE 0.45 % IV SOLN
INTRAVENOUS | Status: DC
Start: 2015-11-04 — End: 2015-11-05
  Administered 2015-11-04: 14:00:00 via INTRAVENOUS

## 2015-11-04 MED ORDER — PANTOPRAZOLE SODIUM 40 MG IV SOLR
40.0000 mg | Freq: Once | INTRAVENOUS | Status: AC
  Administered 2015-11-04: 40 mg via INTRAVENOUS
  Filled 2015-11-04: qty 40

## 2015-11-04 NOTE — ED Notes (Signed)
Per EMS - pt hx lower GI bleeds and ulcers. Pt c/o n/v x 4-5 days. Emesis at home light red w/ coffee ground appearance. Pt c/o abd pain. Pt accidental ingestion [employer] carpet cleaner (powder). 12-lead NSR w/ occ PVC. Pt hx diabetes, ran out of long-lasting insulin. Pt has not had BP meds or ulcer meds.

## 2015-11-04 NOTE — ED Notes (Signed)
Pt transported to Xray. 

## 2015-11-04 NOTE — ED Notes (Signed)
[place] and discussed ingestion of 2 tablespoons [employer] - sodium bicarb is main ingredient. Ingestion causes stomach upset (n/v/etc). No toxicity to be concerned about and no lab work necessary. Suggestion to ensure pt continues to drink fluids to prevent dehydration from vomiting.

## 2015-11-04 NOTE — Progress Notes (Signed)
MD was notified about CBG and plan is to continue insulin drip she will review BMET.

## 2015-11-04 NOTE — ED Provider Notes (Signed)
CSN: [id number]     Arrival date & time 11/04/15  [id number] History   First MD Initiated Contact with Patient 11/04/15 0827     Chief Complaint  Patient presents with  . Ingestion  . Abdominal Pain     (Consider location/radiation/quality/duration/timing/severity/associated sxs/prior Treatment) HPI Patient reports he has not been able to take his regular medications for almost a week. He reports he has run out of them and he cannot afford to get them. He has missed his insulin doses. He also reports that he has run out of his antacid medication. He reports he's been having a lot of acid reflux all week. He reports yesterday evening he started having vomiting. He reports multiple episodes of vomiting, he reports seeing blood, Bright red as well as dark. No diarrhea and no fever. Patient reports this morning he mistakenly took some arm and Hammer carpet cleaning powder thinking it was arm and Hammer baking soda. He reports taking 2 teaspoons. He denies significant abdominal pain. He reports he continues to have the same epigastric discomfort that he had earlier. Past Medical History  Diagnosis Date  . Hypertension   . Blood transfusion 2014    "related to bleeding ulcer"  . Venous insufficiency 12/13/2011  . Cardiac arrest ([place]) 12/07/2012    Anoxic encephalopathy  . Anxiety   . Acute renal insufficiency 12/08/2012  . High cholesterol   . OSA on CPAP 12/07/2012  . Type II diabetes mellitus ([place])   . Bleeding ulcer 2014  . GERD (gastroesophageal reflux disease)   . Anoxic encephalopathy [facility])    Past Surgical History  Procedure Laterality Date  . Knee arthroscopy Left ~ 1999  . Esophagogastroduodenoscopy Left 11/26/2012    Procedure: ESOPHAGOGASTRODUODENOSCOPY (EGD);  Surgeon: Wonda Horner, MD;  Location: [facility] ENDOSCOPY;  Service: Endoscopy;  Laterality: Left;  . Cardiovascular stress test  03/16/13    Very Poor Exercise Tolerance; NON DIAGNOSTIC TEST  . Cataract extraction w/ intraocular lens  implant Left 03/07/2014    [facility] @ [facility]  . Esophagogastroduodenoscopy N/A 10/10/2014    Procedure: ESOPHAGOGASTRODUODENOSCOPY (EGD);  Surgeon: Ladene Artist, MD;  Location: [facility] ENDOSCOPY;  Service: Endoscopy;  Laterality: N/A;   History reviewed. No pertinent family history. Social History  Substance Use Topics  . Smoking status: Never Smoker   . Smokeless tobacco: Current User    Types: Snuff  . Alcohol Use: 3.6 oz/week    6 Cans of beer per week    Review of Systems  10 Systems reviewed and are negative for acute change except as noted in the HPI.   Allergies  Lisinopril  Home Medications   Prior to Admission medications   Medication Sig Start Date End Date Taking? Authorizing Provider  aspirin EC 81 MG tablet Take 81 mg by mouth at bedtime.    Yes Historical Provider, MD  citalopram (CELEXA) 20 MG tablet Take 1 tablet (20 mg total) by mouth daily. 05/22/15  Yes Mariel Aloe, MD  folic acid (FOLVITE) 1 MG tablet Take 1 tablet (1 mg total) by mouth daily. 03/12/15  Yes Virginia Crews, MD  gabapentin (NEURONTIN) 300 MG capsule Take 1 capsule (300 mg total) by mouth 3 (three) times daily. 10/18/15  Yes Sela Hua, MD  insulin NPH Human (NOVOLIN N) 100 UNIT/ML injection Inject 13 units right before breakfast and 7 units right before dinner. Inject under your skin. Patient taking differently: Inject 7-13 Units into the skin 2 (two) times daily  before a meal. Inject 13 units right before breakfast and 7 units right before dinner. Inject under your skin. 10/18/15  Yes Mercy Riding, MD  lactulose (CHRONULAC) 10 GM/15ML solution Take 15 mLs (10 g total) by mouth daily. 10/18/15  Yes Sela Hua, MD  LORazepam (ATIVAN) 0.5 MG tablet Take 1 tablet (0.5 mg total) by mouth every 8 (eight) hours as needed for anxiety or seizure. 10/18/15  Yes Sela Hua, MD  losartan (COZAAR) 50 MG tablet Take 1 tablet (50 mg total) by mouth daily. 05/22/15  Yes Mariel Aloe, MD   metFORMIN (GLUCOPHAGE) 500 MG tablet Take 1 tablet (500 mg total) by mouth 2 (two) times daily with a meal. 10/18/15  Yes Mercy Riding, MD  metoprolol succinate (TOPROL-XL) 100 MG 24 hr tablet TAKE ONE TABLET BY MOUTH DAILY. TAKE WITH OR IMMEDIATELY FOLLOWING A MEAL Patient taking differently: Take 100 mg by mouth at bedtime.  08/25/15  Yes Mariel Aloe, MD  Multiple Vitamin (MULTIVITAMIN WITH MINERALS) TABS tablet Take 1 tablet by mouth daily.   Yes Historical Provider, MD  omeprazole (PRILOSEC) 20 MG capsule Take 1 capsule (20 mg total) by mouth 2 (two) times daily before a meal. 03/12/15  Yes Virginia Crews, MD  polyvinyl alcohol (LIQUIFILM TEARS) 1.4 % ophthalmic solution Place 1 drop into both eyes every 4 (four) hours as needed for dry eyes.   Yes Historical Provider, MD  traMADol (ULTRAM) 50 MG tablet Take 1 tablet (50 mg total) by mouth every 12 (twelve) hours as needed for severe pain. 03/12/15  Yes Virginia Crews, MD  Insulin Syringes, Disposable, U-100 0.3 ML MISC Use one to inject insulin twice daily 10/18/15   Mercy Riding, MD   BP 157/101 mmHg  Pulse 80  Temp(Src) 99.3 F (37.4 C) (Oral)  Resp 22  SpO2 99% Physical Exam  Constitutional: He is oriented to person, place, and time. He appears well-developed and well-nourished.  Clinically patient is well in appearance. He is nontoxic and alert. No respiratory distress.  HENT:  Head: Normocephalic and atraumatic.  Mouth/Throat: Oropharynx is clear and moist. No oropharyngeal exudate.  Posterior oropharynx widely patent. No erythema.  Eyes: EOM are normal. Pupils are equal, round, and reactive to light.  Neck: Neck supple.  Cardiovascular: Normal rate, regular rhythm, normal heart sounds and intact distal pulses.   Pulmonary/Chest: Effort normal and breath sounds normal.  Abdominal: Soft. Bowel sounds are normal. He exhibits no distension. There is tenderness.  Mild epigastric tenderness without guarding.   Musculoskeletal: Normal range of motion. He exhibits no edema or tenderness.  Neurological: He is alert and oriented to person, place, and time. He has normal strength. No cranial nerve deficit. He exhibits normal muscle tone. Coordination normal. GCS eye subscore is 4. GCS verbal subscore is 5. GCS motor subscore is 6.  Skin: Skin is warm, dry and intact.  Psychiatric: He has a normal mood and affect.    ED Course  Procedures (including critical care time) Labs Review Labs Reviewed  COMPREHENSIVE METABOLIC PANEL - Abnormal; Notable for the following:    Sodium 134 (*)    Chloride 84 (*)    CO2 18 (*)    Glucose, Bld 395 (*)    Creatinine, Ser 1.57 (*)    Total Protein 8.2 (*)    AST 63 (*)    Alkaline Phosphatase 160 (*)    Total Bilirubin 1.5 (*)    GFR calc non Af Wyvonnia Lora  49 (*)    GFR calc Af Amer 57 (*)    Anion gap 32 (*)    All other components within normal limits  CBC WITH DIFFERENTIAL/PLATELET - Abnormal; Notable for the following:    WBC 10.8 (*)    RDW 15.7 (*)    Neutro Abs 8.6 (*)    All other components within normal limits  URINALYSIS, ROUTINE W REFLEX MICROSCOPIC (NOT AT [facility]) - Abnormal; Notable for the following:    Glucose, UA >1000 (*)    Hgb urine dipstick LARGE (*)    Bilirubin Urine SMALL (*)    Ketones, ur >80 (*)    Protein, ur 100 (*)    All other components within normal limits  URINE RAPID DRUG SCREEN, HOSP PERFORMED - Abnormal; Notable for the following:    Benzodiazepines POSITIVE (*)    All other components within normal limits  URINE MICROSCOPIC-ADD ON - Abnormal; Notable for the following:    Squamous Epithelial / LPF 0-5 (*)    Bacteria, UA RARE (*)    All other components within normal limits  I-STAT CG4 LACTIC ACID, ED - Abnormal; Notable for the following:    Lactic Acid, Venous 3.73 (*)    All other components within normal limits  I-STAT VENOUS BLOOD GAS, ED - Abnormal; Notable for the following:    pH, Ven 7.383 (*)    pCO2, Ven  36.4 (*)    Acid-base deficit 3.0 (*)    All other components within normal limits  LIPASE, BLOOD  PROTIME-INR  ETHANOL  BLOOD GAS, VENOUS    Imaging Review Dg Abd Acute W/chest  11/04/2015  CLINICAL DATA:  Nausea and vomiting for the past 4-5 days. Abdominal pain. History of lower GI bleeding stomach ulcers. EXAM: DG ABDOMEN ACUTE W/ 1V CHEST COMPARISON:  Chest radiograph - 10/08/2014; 08/22/2014 FINDINGS: Grossly unchanged cardiac silhouette and mediastinal contours. No focal parenchymal opacities. No pleural effusion or pneumothorax. No evidence of edema. Moderate colonic stool burden without evidence of obstruction. No pneumoperitoneum, pneumatosis or portal venous gas. Faster calcifications overlie the left upper abdominal quadrant. Several phleboliths overlie the lower pelvis bilaterally. Otherwise, no abnormal intra-abdominal calcifications. Mild-to-moderate degenerative change of the bilateral hips, right greater than left. No definite acute or aggressive osseous abnormalities. IMPRESSION: 1.  No acute cardiopulmonary disease. 2. Moderate colonic stool burden without evidence of enteric obstruction. Electronically Signed   By: Sandi Mariscal M.D.   On: 11/04/2015 09:26   I have personally reviewed and evaluated these images and lab results as part of my medical decision-making.   EKG Interpretation   Date/Time:  Saturday November 04 2015 08:29:05 EST Ventricular Rate:  90 PR Interval:  157 QRS Duration: 95 QT Interval:  404 QTC Calculation: 494 R Axis:   80 Text Interpretation:  Sinus rhythm Consider right atrial enlargement  Borderline ST depression, diffuse leads Borderline prolonged QT interval  agree, no change from old Confirmed by Johnney Killian, MD, Jeannie Done [phone]) on  11/04/2015 8:36:17 AM     Consult: (11:24) patient's case is reviewed with family practice resident [facility] for admission. Nurse consult the poison control, nontoxic ingestion with baking soda being primary component  of powdered arm and Hammer carpet cleaner. MDM   Final diagnoses:  Diabetic ketoacidosis without coma associated with type 2 diabetes mellitus ([place])  Ingestion of substance, initial encounter   Patient is alert and nontoxic. He is not for signs of respiratory distress. A primary problem has been medication noncompliance due to patient's  reported inability to get his medications. He has become hyperglycemic and reports vomiting onset yesterday evening multiple episodes. He reports having run out of his antacid medications and erroneously taking arm and Hammer carpet cleaning powder. At this point the patient does have a significant anion gap. Mild acidosis is present. Hydration will be initiated and the patient will be admitted for treatment.  Charlesetta Shanks, MD 11/04/15 [phone]

## 2015-11-04 NOTE — H&P (Signed)
[facility] Admission History and Physical Service Pager: [phone]  Patient name: Jeffrey [name] Medical record number: [id number] Date of birth: 11-02-1965 Age: 50 y.o. Gender: male  Primary Care Provider: Cordelia Poche, MD Consultants: none Code Status: Full (per discussion on admission)  Chief Complaint: ingestion of carpet freshener  Assessment and Plan: Jeffrey [name] is a 50 y.o. male presenting with DKA in the setting of not taking his insulin for the last 2 weeks. PMH is significant for DM II, obesity, alcohol abuse, cardiac arrest, HTN, HLD, anxiety and OSA.  Unintentional Ingestion: s/p ingestion of 2 tablespoons of [employer] carpet freshener, large emesis 30 seconds after ingestion after realizing it wasn't baking soda. Poison Control contacted, no toxicity to be concerned about and no lab work necessary as main ingredient is sodium bicarb. Per MSDS, material is considered non toxic and ingestion of small amounts(1-2 tablespoonfuls) are not likely to cause injury. - Per [employer], keep hydrated. Since NPO, will continue IVF as described below  DKA/poorly controlled DM-II: A1c 9.5 10/17/15. DKA in the setting of not taking his insulin since being discharged from the hospital on 10/19/15. Patient pays out of pocket for medications. He is currently trying to get all of his paperwork needed for the MAP.  BG to 395. VBG 7.383/36.4/37/21.7. UA with glucose to 1000, protein 100 & ketones >80. WBC only slightly elevated at 10.8 making infectious process less likely. K 4.5 and sCr of 1.57 (b/l~1). He was given 1L of NS in ED. Repeat BMP significant for bicarb 13, K 4.9 and AG 35.  -Admit to FPTS. Attending Dr. McDiarmid -IVF and Insulin per protocol for DKA. Doesn't need K at this time -BMP q 4 -CBG per protocol -Cardiac monitoring -Vital signs per protocol -NPO -Continue home gabapentin -Zofran as needed nausea -Case management for medication  assistance  Lactic acidosis: lactic acid 3.73. History of this in the past due to alcoholism. His last drink was last night. DKA could contribute to this as well. No signs of infections. CBC within normal limit except mildly elevated WBC. Afebrile and normotensive. - trend lactic acid; goal <2  AKI: likely from dehydration secondary to DKA. Cr 1.57. Baseline ~1. Anticipate improvement with rehydration - IVF - hold home losartan - Trend BMP  Alcohol abuse: Admitted previously for AMS due to EtOH intoxiciation. Last drink was last night. Admits to drinking a 6 pack of beer daily - CIWA as above - social work consult  Proteinuria: urine protein >300. Also has elevated urine protein in the past as well. This concerning for nephrotic syndrome. Also with >1000 glucose in urine which may be contributing. -Consider outpatient referral to nephrology -Restart home ARB pending resolution of AKI  Hypertension- on losartan 50 mg daily and Toprol 100 mg daily at home. Normotensive. - Continue home Toprol - hold home losartan with AKI  Obstructive Sleep Apnea- History of refusing CPAP in past - Will order CPAP nightly.   Hematemesis: Hx of hematemesis, worsening in the last week or so. Was placed on PPI but patient has not been taking. Hgb stable at 14.7, better than baseline. Likely due to gastritis vs Mallory-Weiss tear. No varices present on endoscopy 10/2014.  - Daily CBC - PPI - Will need outpatient GI referral, also due for colonoscopy - If recurs consider gastroccult.  - Do not place NG tube due to risk for varices  Hepatic Cirrhosis. Listed on problem list. Likely secondary to alcoholism. HCV and  HBV negative last year. [name] on 03/09/2015 with hepatic steatosis - Continue home lactulose  Vision issues: reports flashes of purple-red colors. Not new. Likely diabetic retinopathy. Denies pain. -Outpt opthalmology referral  FEN/GI: NPO except for sips with meds Glucomander Replete as  needed  Prophylaxis: SubQ Heparin  Disposition: step down pending resolution of DKA  History of Present Illness:  Jeffrey [name] is a 50 y.o. male presenting with unintentional ingestion of carpet freshener and DKA.  Last night started having nausea and vomiting. Checked sugars and they were in the 300s and 400s. Has had 10 episodes of red to dark emesis since last night. Emesis had a coffee ground appearance this morning. Had similar symptoms last year. Has been out of medications including insulin and protonix for the past week. Accidentally took [employer] carpet freshner this morning. Took 2 tablespoons. Started vomiting right after ingesting floor cleaner. Then came to the ED. Has been without insulin for 2 weeks. Has been falling more and feeling more unsteady. Patient attributes this to his blood sugar being too high. No BM in 4-5 days.  Had a endoscopy last year and was found to have an ulcer.   No chest pain. No shortness of breath.   Review Of Systems: Per HPI with the following additions: no fevers Otherwise the remainder of the systems were negative.  Patient Active Problem List   Diagnosis Date Noted  . Ingestion of substance 11/04/2015  . Diabetic ketoacidosis without coma associated with type 2 diabetes mellitus ([place])   . Essential hypertension   . DKA (diabetic ketoacidoses) ([place]) 10/15/2015  . Lactic acidosis 09/13/2015  . Altered mental status 09/12/2015  . Diabetic polyneuropathy associated with type 2 diabetes mellitus ([place]) 08/04/2015  . Hepatic cirrhosis ([place]) 03/22/2015  . Ataxia 03/20/2015  . Aphasia   . Elevated liver enzymes   . Toxic metabolic encephalopathy 123456  . Dysarthria 03/09/2015  . AKI (acute kidney injury) ([place])   . Alcohol intoxication ([place])   . Abdominal pain 11/17/2014  . Acute blood loss anemia 11/17/2014  . Hematemesis with nausea   . Alcohol abuse   . Hyperkalemia   . Tachycardia   . Hematemesis 11/16/2014  .  Gastrointestinal hemorrhage with melena 10/10/2014  . Erosive esophagitis 10/10/2014  . HLD (hyperlipidemia)   . Anemia due to other cause   . Hyperglycemia   . Syncope and collapse   . Dehydration 05/18/2014  . OSA on CPAP 12/28/2012  . Pulmonary edema 12/08/2012  . Hypoglycemia 12/07/2012  . Cardiac arrest ([place]) 12/07/2012  . Obesity 12/07/2012  . Anoxic brain injury ([place]) 12/07/2012  . GI bleed 11/26/2012  . Venous insufficiency 12/13/2011  . INSOMNIA, CHRONIC 02/14/2009  . 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 ALCOHOL ABUSE, HX OF 11/10/2007  . ERECTILE DYSFUNCTION 04/14/2007  . DM type 2 goal A1C below 7.5 12/04/2006  . HYPERCHOLESTEROLEMIA 12/04/2006  . OBESITY, NOS 12/04/2006  . Anxiety state 12/04/2006  . HYPERTENSION, BENIGN SYSTEMIC 12/04/2006  . ARTHRITIS 12/04/2006    Past Medical History: Past Medical History  Diagnosis Date  . Hypertension   . Blood transfusion 2014    "related to bleeding ulcer"  . Venous insufficiency 12/13/2011  . Cardiac arrest ([place]) 12/07/2012    Anoxic encephalopathy  . Anxiety   . Acute renal insufficiency 12/08/2012  . High cholesterol   . OSA on CPAP 12/07/2012  . Type II diabetes mellitus ([place])   . Bleeding ulcer 2014  .  GERD (gastroesophageal reflux disease)   . Anoxic encephalopathy [facility])     Past Surgical History: Past Surgical History  Procedure Laterality Date  . Knee arthroscopy Left ~ 1999  . Esophagogastroduodenoscopy Left 11/26/2012    Procedure: ESOPHAGOGASTRODUODENOSCOPY (EGD);  Surgeon: Wonda Horner, MD;  Location: [facility] ENDOSCOPY;  Service: Endoscopy;  Laterality: Left;  . Cardiovascular stress test  03/16/13    Very Poor Exercise Tolerance; NON DIAGNOSTIC TEST  . Cataract extraction w/ intraocular lens implant Left 03/07/2014    [place] @ [facility]  . Esophagogastroduodenoscopy N/A 10/10/2014    Procedure: ESOPHAGOGASTRODUODENOSCOPY (EGD);  Surgeon: Ladene Artist, MD;  Location: [facility]  ENDOSCOPY;  Service: Endoscopy;  Laterality: N/A;    Social History: Social History  Substance Use Topics  . Smoking status: Never Smoker   . Smokeless tobacco: Current User    Types: Snuff  . Alcohol Use: 3.6 oz/week    6 Cans of beer per week   Please also refer to relevant sections of EMR.  Family History: History reviewed. No pertinent family history.  Allergies and Medications: Allergies  Allergen Reactions  . Lisinopril Other (See Comments)    Pt reports nose bleed.   No current facility-administered medications on file prior to encounter.   Current Outpatient Prescriptions on File Prior to Encounter  Medication Sig Dispense Refill  . aspirin EC 81 MG tablet Take 81 mg by mouth at bedtime.     . citalopram (CELEXA) 20 MG tablet Take 1 tablet (20 mg total) by mouth daily. 30 tablet 3  . folic acid (FOLVITE) 1 MG tablet Take 1 tablet (1 mg total) by mouth daily. 30 tablet 0  . gabapentin (NEURONTIN) 300 MG capsule Take 1 capsule (300 mg total) by mouth 3 (three) times daily. 90 capsule 1  . insulin NPH Human (NOVOLIN N) 100 UNIT/ML injection Inject 13 units right before breakfast and 7 units right before dinner. Inject under your skin. (Patient taking differently: Inject 7-13 Units into the skin 2 (two) times daily before a meal. Inject 13 units right before breakfast and 7 units right before dinner. Inject under your skin.) 10 mL 11  . lactulose (CHRONULAC) 10 GM/15ML solution Take 15 mLs (10 g total) by mouth daily. 240 mL 0  . LORazepam (ATIVAN) 0.5 MG tablet Take 1 tablet (0.5 mg total) by mouth every 8 (eight) hours as needed for anxiety or seizure. 90 tablet 0  . losartan (COZAAR) 50 MG tablet Take 1 tablet (50 mg total) by mouth daily. 30 tablet 1  . metFORMIN (GLUCOPHAGE) 500 MG tablet Take 1 tablet (500 mg total) by mouth 2 (two) times daily with a meal. 60 tablet 1  . metoprolol succinate (TOPROL-XL) 100 MG 24 hr tablet TAKE ONE TABLET BY MOUTH DAILY. TAKE WITH OR  IMMEDIATELY FOLLOWING A MEAL (Patient taking differently: Take 100 mg by mouth at bedtime. ) 30 tablet 6  . Multiple Vitamin (MULTIVITAMIN WITH MINERALS) TABS tablet Take 1 tablet by mouth daily.    [name] omeprazole (PRILOSEC) 20 MG capsule Take 1 capsule (20 mg total) by mouth 2 (two) times daily before a meal. 120 capsule 6  . polyvinyl alcohol (LIQUIFILM TEARS) 1.4 % ophthalmic solution Place 1 drop into both eyes every 4 (four) hours as needed for dry eyes.    . traMADol (ULTRAM) 50 MG tablet Take 1 tablet (50 mg total) by mouth every 12 (twelve) hours as needed for severe pain. 30 tablet 0  . Insulin Syringes,  Disposable, U-100 0.3 ML MISC Use one to inject insulin twice daily 100 each 0    Objective: BP 151/93 mmHg  Pulse 81  Temp(Src) 99.3 F (37.4 C) (Oral)  Resp 21  SpO2 100% Exam: General: In NAD, laying in hospital bed Eyes: PERRLA, non icteric sclera ENTM: moist mucous membranes  Neck: supple Cardiovascular: regular rate and rhythm, normal s1 and s2, no murmurs Respiratory: normal work of breathing, clear to auscultation bilaterally Abdomen: soft, normal bowel sounds, non distended,no masses palpated MSK: normal range of motion of all joints Skin: no rashes, scrape on right shoulder and left foot from falling Neuro: alert and oriented x3, CN 2-12 intact, 5/5 strength in bilateral UE and LE, sensation intact throughout Psych: normal mood and affect  Labs and Imaging: CBC BMET   Recent Labs Lab 11/04/15 0833  WBC 10.8*  HGB 14.7  HCT 43.4  PLT 228    Recent Labs Lab 11/04/15 0833  NA 134*  K 4.5  CL 84*  CO2 18*  BUN 9  CREATININE 1.57*  GLUCOSE 395*  CALCIUM 9.1     VBG: 7.383 / 36.4 / 21.07  Lactic acid 3.72 UA: >1000 glucose, >80 ketones UDS: Benzos Ethanol <5  EKG: NSR, QTc 494  Dg Abd Acute W/chest  11/04/2015  CLINICAL DATA:  Nausea and vomiting for the past 4-5 days. Abdominal pain. History of lower GI bleeding stomach ulcers. EXAM: DG ABDOMEN  ACUTE W/ 1V CHEST COMPARISON:  Chest radiograph - 10/08/2014; 08/22/2014 FINDINGS: Grossly unchanged cardiac silhouette and mediastinal contours. No focal parenchymal opacities. No pleural effusion or pneumothorax. No evidence of edema. Moderate colonic stool burden without evidence of obstruction. No pneumoperitoneum, pneumatosis or portal venous gas. Faster calcifications overlie the left upper abdominal quadrant. Several phleboliths overlie the lower pelvis bilaterally. Otherwise, no abnormal intra-abdominal calcifications. Mild-to-moderate degenerative change of the bilateral hips, right greater than left. No definite acute or aggressive osseous abnormalities. IMPRESSION: 1.  No acute cardiopulmonary disease. 2. Moderate colonic stool burden without evidence of enteric obstruction. Electronically Signed   By: Sandi Mariscal M.D.   On: 11/04/2015 09:26    Carlyle Dolly, MD 11/04/2015, 11:40 AM PGY-1, [place] Intern pager: [phone], text pages welcome  I have read the above note and made revisions highlighted in blue.  Algis Greenhouse. Jerline Pain, [facility] [facility] Resident PGY-2 11/04/2015 4:23 PM

## 2015-11-04 NOTE — ED Notes (Signed)
Attempted report x1. 

## 2015-11-05 LAB — BASIC METABOLIC PANEL
ANION GAP: 17 — AB (ref 5–15)
ANION GAP: 10 (ref 5–15)
ANION GAP: 9 (ref 5–15)
BUN: 9 mg/dL (ref 6–20)
BUN: 8 mg/dL (ref 6–20)
BUN: 5 mg/dL — ABNORMAL LOW (ref 6–20)
CALCIUM: 8.2 mg/dL — AB (ref 8.9–10.3)
CHLORIDE: 90 mmol/L — AB (ref 101–111)
CHLORIDE: 94 mmol/L — AB (ref 101–111)
CHLORIDE: 97 mmol/L — AB (ref 101–111)
CO2: 27 mmol/L (ref 22–32)
CO2: 30 mmol/L (ref 22–32)
CO2: 29 mmol/L (ref 22–32)
Calcium: 8.3 mg/dL — ABNORMAL LOW (ref 8.9–10.3)
Calcium: 8.1 mg/dL — ABNORMAL LOW (ref 8.9–10.3)
Creatinine, Ser: 1.34 mg/dL — ABNORMAL HIGH (ref 0.61–1.24)
Creatinine, Ser: 1.24 mg/dL (ref 0.61–1.24)
Creatinine, Ser: 1.08 mg/dL (ref 0.61–1.24)
GFR calc Af Amer: >60 mL/min (ref >60)
GFR calc Af Amer: >60 mL/min (ref >60)
GFR calc non Af Amer: 60 mL/min — ABNORMAL LOW (ref >60)
GFR calc non Af Amer: >60 mL/min (ref >60)
GFR calc non Af Amer: >60 mL/min (ref >60)
GLUCOSE: 210 mg/dL — AB (ref 65–99)
GLUCOSE: 149 mg/dL — AB (ref 65–99)
GLUCOSE: 114 mg/dL — AB (ref 65–99)
POTASSIUM: 3.6 mmol/L (ref 3.5–5.1)
POTASSIUM: 3 mmol/L — AB (ref 3.5–5.1)
POTASSIUM: 3.2 mmol/L — AB (ref 3.5–5.1)
SODIUM: 134 mmol/L — AB (ref 135–145)
Sodium: 134 mmol/L — ABNORMAL LOW (ref 135–145)
Sodium: 135 mmol/L (ref 135–145)

## 2015-11-05 LAB — GLUCOSE, CAPILLARY
GLUCOSE-CAPILLARY: 112 mg/dL — AB (ref 65–99)
GLUCOSE-CAPILLARY: 121 mg/dL — AB (ref 65–99)
GLUCOSE-CAPILLARY: 153 mg/dL — AB (ref 65–99)
GLUCOSE-CAPILLARY: 154 mg/dL — AB (ref 65–99)
GLUCOSE-CAPILLARY: 171 mg/dL — AB (ref 65–99)
GLUCOSE-CAPILLARY: 182 mg/dL — AB (ref 65–99)
GLUCOSE-CAPILLARY: 220 mg/dL — AB (ref 65–99)
GLUCOSE-CAPILLARY: 221 mg/dL — AB (ref 65–99)
Glucose-Capillary: 125 mg/dL — ABNORMAL HIGH (ref 65–99)
Glucose-Capillary: 140 mg/dL — ABNORMAL HIGH (ref 65–99)
Glucose-Capillary: 215 mg/dL — ABNORMAL HIGH (ref 65–99)
Glucose-Capillary: 183 mg/dL — ABNORMAL HIGH (ref 65–99)
Glucose-Capillary: 113 mg/dL — ABNORMAL HIGH (ref 65–99)
Glucose-Capillary: 139 mg/dL — ABNORMAL HIGH (ref 65–99)
Glucose-Capillary: 150 mg/dL — ABNORMAL HIGH (ref 65–99)
Glucose-Capillary: 170 mg/dL — ABNORMAL HIGH (ref 65–99)
Glucose-Capillary: 209 mg/dL — ABNORMAL HIGH (ref 65–99)

## 2015-11-05 MED ORDER — POTASSIUM CHLORIDE CRYS ER 20 MEQ PO TBCR
40.0000 meq | EXTENDED_RELEASE_TABLET | Freq: Two times a day (BID) | ORAL | Status: AC
  Administered 2015-11-05 – 2015-11-06 (×2): 40 meq via ORAL
  Filled 2015-11-05 (×2): qty 2

## 2015-11-05 MED ORDER — INSULIN ASPART 100 UNIT/ML ~~LOC~~ SOLN
0.0000 [IU] | SUBCUTANEOUS | Status: DC
  Administered 2015-11-05 – 2015-11-06 (×3): 8 [IU] via SUBCUTANEOUS
  Administered 2015-11-06: 4 [IU] via SUBCUTANEOUS
  Administered 2015-11-06: 8 [IU] via SUBCUTANEOUS
  Administered 2015-11-06: 12 [IU] via SUBCUTANEOUS
  Administered 2015-11-07: 8 [IU] via SUBCUTANEOUS

## 2015-11-05 MED ORDER — LOSARTAN POTASSIUM 50 MG PO TABS
25.0000 mg | ORAL_TABLET | Freq: Every day | ORAL | Status: DC
  Administered 2015-11-06 – 2015-11-08 (×3): 25 mg via ORAL
  Filled 2015-11-05 (×4): qty 1

## 2015-11-05 MED ORDER — LOSARTAN POTASSIUM 25 MG PO TABS: 25.0000 mg | ORAL_TABLET | Freq: Every day | ORAL | Status: DC

## 2015-11-05 MED ORDER — INSULIN GLARGINE 100 UNIT/ML ~~LOC~~ SOLN
5.0000 [IU] | Freq: Every day | SUBCUTANEOUS | Status: DC
  Administered 2015-11-05 – 2015-11-06 (×2): 5 [IU] via SUBCUTANEOUS
  Filled 2015-11-05 (×3): qty 0.05

## 2015-11-05 MED ORDER — DEXTROSE-NACL 5-0.45 % IV SOLN
INTRAVENOUS | Status: DC
  Administered 2015-11-05: 08:00:00 via INTRAVENOUS
  Filled 2015-11-05 (×4): qty 1000

## 2015-11-05 NOTE — Progress Notes (Signed)
Family Medicine Teaching Service Daily Progress Note Intern Pager: [phone]  Patient name: Jeffrey [name] Medical record number: [id number] Date of birth: [birth year] Age: 50 y.o. Gender: male  Primary Care Provider: Cordelia Poche, MD Consultants: none Code Status: Full  Pt Overview and Major Events to Date:  1/28: admitted for DKA  Assessment and Plan: Jeffrey [name] is a 50 y.o. male presenting with DKA in the setting of not taking his insulin for the last 2 weeks. PMH is significant for DM II, obesity, alcohol abuse, cardiac arrest, HTN, HLD, anxiety and OSA.  DKA/poorly controlled DM-II: A1c 9.5 10/17/15. DKA in the setting of not taking his insulin since being discharged from the hospital on 10/19/15.  -IVF and Insulin per protocol for DKA.  - AG closed x1; repeating BMP at 12:00pm 1/29, if still closed will consider DC insulin drip and initiate injectable regimen -CBG per protocol -Cardiac monitoring -Vital signs per protocol -Continue home gabapentin -Zofran as needed nausea -Case management for medication assistance (help establishing w/ MAP program)  Lactic acidosis: Resolved;  lactic acid 3.73 on admission. No signs of infections. Afebrile and normotensive.Likely 2/2 dehydration from DKA. - 3.73 >> 1.7 >> 1.4  Unintentional Ingestion: s/p ingestion of 2 tablespoons of [employer] carpet freshener. Poison Control contacted, no toxicity to be concerned about and no lab work necessary as main ingredient is sodium bicarb. - Per [employer], keep hydrated. - Will monitor  AKI: Improved;  likely from dehydration secondary to DKA. Baseline ~1. Anticipate improvement with rehydration - IVF - holding home losartan - Cr. 1.24 (1/29)  Alcohol abuse: Admitted previously for AMS due to EtOH intoxiciation. Last drink was last night. Admits to drinking a 6 pack of beer daily - CIWA as above - social work consult  Proteinuria: urine protein >300. Also has elevated urine  protein in the past as well. This concerning for nephrotic syndrome. Also with >1000 glucose in urine which may be contributing. -Consider outpatient referral to nephrology -Will restart home ARB w/ resolution of AKI - 1/29  Hypertension- on losartan 50 mg daily and Toprol 100 mg daily at home. Normotensive. - Continue home Toprol - hold home losartan with AKI  Obstructive Sleep Apnea- History of refusing CPAP in past - Will order CPAP nightly.   Hematemesis: Hx of hematemesis, worsening in the last week or so. Was placed on PPI but patient has not been taking. Hgb stable at 14.7, better than baseline (likely from dehydration). Likely due to gastritis vs Mallory-Weiss tear. No varices present on endoscopy 10/2014.  - Repeat CBC on 1/30 - PPI - Will need outpatient GI referral, also due for colonoscopy  Hepatic Cirrhosis. Listed on problem list. Likely secondary to alcoholism. HCV and HBV negative last year. [name] on 03/09/2015 with hepatic steatosis - Continue home lactulose  Vision issues: reports flashes of purple-red colors. Not new. Likely diabetic retinopathy. Denies pain. -Outpt opthalmology referral  FEN/GI: NPO except for sips with meds Glucomander Replete as needed  Prophylaxis: SubQ Heparin  Disposition: pending medical improvement  Subjective:  Patient states he is doing well. Feeling much better than yesterday. No SOB/CP. Is interested in eating when allowed. Family at bedside.  Objective: Temp:  [97.9 F (36.6 C)-98.9 F (37.2 C)] 98.1 F (36.7 C) (01/29 0333) Pulse Rate:  [69-98] 69 (01/29 1035) Resp:  [13-21] 19 (01/29 0333) BP: (108-158)/(74-106) 115/80 mmHg (01/29 1035) SpO2:  [96 %-100 %] 99 % (01/29 1035) Weight:  [207 lb 3.7 oz (  94 kg)] 207 lb 3.7 oz (94 kg) (01/28 1349) Physical Exam: General: In NAD, A/O, appears comfortable Eyes: PERRLA, non icteric sclera ENTM: moist mucous membranes , poor dentition Cardiovascular: regular rate and rhythm, normal  s1 and s2, no murmurs Respiratory: normal work of breathing, clear to auscultation bilaterally Abdomen: soft, normal bowel sounds, non tender, non distended,no masses palpated Neuro: CN 2-12 intact, 5/5 strength in bilateral UE and LE, sensation intact throughout  Laboratory:  Recent Labs Lab 11/04/15 0833  WBC 10.8*  HGB 14.7  HCT 43.4  PLT 228    Recent Labs Lab 11/04/15 0833  11/04/15 2055 11/05/15 0110 11/05/15 0430  NA 134*  < > 137 134* 134*  K 4.5  < > 3.6 3.6 3.0*  CL 84*  < > 91* 90* 94*  CO2 18*  < > 23 27 30   BUN 9  < > 9 9 8   CREATININE 1.57*  < > 1.52* 1.34* 1.24  CALCIUM 9.1  < > 8.8* 8.3* 8.1*  PROT 8.2*  --   --   --   --   BILITOT 1.5*  --   --   --   --   ALKPHOS 160*  --   --   --   --   ALT 39  --   --   --   --   AST 63*  --   --   --   --   GLUCOSE 395*  < > 162* 210* 149*  < > = values in this interval not displayed.  Imaging/Diagnostic Tests: XR Abd/Chest (11/04/15) IMPRESSION: 1. No acute cardiopulmonary disease. 2. Moderate colonic stool burden without evidence of enteric obstruction.   Elberta Leatherwood, MD 11/05/2015, 11:55 AM PGY-2, [place] Intern pager: [phone], text pages welcome

## 2015-11-06 LAB — LIPASE, BLOOD: LIPASE: 17 U/L (ref 11–51)

## 2015-11-06 LAB — GLUCOSE, CAPILLARY
GLUCOSE-CAPILLARY: 110 mg/dL — AB (ref 65–99)
GLUCOSE-CAPILLARY: 111 mg/dL — AB (ref 65–99)
GLUCOSE-CAPILLARY: 164 mg/dL — AB (ref 65–99)
GLUCOSE-CAPILLARY: 204 mg/dL — AB (ref 65–99)
Glucose-Capillary: 226 mg/dL — ABNORMAL HIGH (ref 65–99)
Glucose-Capillary: 263 mg/dL — ABNORMAL HIGH (ref 65–99)

## 2015-11-06 LAB — BASIC METABOLIC PANEL
ANION GAP: 8 (ref 5–15)
BUN: <5 mg/dL — ABNORMAL LOW (ref 6–20)
CHLORIDE: 98 mmol/L — AB (ref 101–111)
CO2: 29 mmol/L (ref 22–32)
Calcium: 8.2 mg/dL — ABNORMAL LOW (ref 8.9–10.3)
Creatinine, Ser: 1.11 mg/dL (ref 0.61–1.24)
GFR calc Af Amer: >60 mL/min (ref >60)
GFR calc non Af Amer: >60 mL/min (ref >60)
Glucose, Bld: 135 mg/dL — ABNORMAL HIGH (ref 65–99)
Potassium: 3.4 mmol/L — ABNORMAL LOW (ref 3.5–5.1)
Sodium: 135 mmol/L (ref 135–145)

## 2015-11-06 LAB — CBC
HCT: 34 % — ABNORMAL LOW (ref 39.0–52.0)
HEMOGLOBIN: 11.6 g/dL — AB (ref 13.0–17.0)
MCH: 33 pg (ref 26.0–34.0)
MCHC: 34.1 g/dL (ref 30.0–36.0)
MCV: 96.9 fL (ref 78.0–100.0)
PLATELETS: 151 10*3/uL (ref 150–400)
RBC: 3.51 MIL/uL — AB (ref 4.22–5.81)
RDW: 15.9 % — ABNORMAL HIGH (ref 11.5–15.5)
WBC: 7.6 10*3/uL (ref 4.0–10.5)

## 2015-11-06 LAB — MAGNESIUM: MAGNESIUM: 0.9 mg/dL — AB (ref 1.7–2.4)

## 2015-11-06 LAB — TROPONIN I

## 2015-11-06 MED ORDER — LIVING WELL WITH DIABETES BOOK
Freq: Once | Status: AC
  Administered 2015-11-06: 10:00:00
  Filled 2015-11-06: qty 1

## 2015-11-06 MED ORDER — MAGNESIUM SULFATE 4 GM/100ML IV SOLN
4.0000 g | Freq: Once | INTRAVENOUS | Status: AC
  Administered 2015-11-06: 4 g via INTRAVENOUS
  Filled 2015-11-06: qty 100

## 2015-11-06 MED ORDER — GI COCKTAIL ~~LOC~~
30.0000 mL | Freq: Once | ORAL | Status: AC
  Administered 2015-11-06: 30 mL via ORAL
  Filled 2015-11-06: qty 30

## 2015-11-06 MED ORDER — GLUCERNA SHAKE PO LIQD
237.0000 mL | Freq: Two times a day (BID) | ORAL | Status: DC
  Administered 2015-11-06 – 2015-11-08 (×3): 237 mL via ORAL
  Filled 2015-11-06: qty 237

## 2015-11-06 NOTE — Progress Notes (Signed)
Family Medicine Teaching Service Daily Progress Note Intern Pager: [phone]  Patient name: Jeffrey [name] Medical record number: [id number] Date of birth: 04-27-1966 Age: 50 y.o. Gender: male  Primary Care Provider: Cordelia Poche, MD Consultants: none Code Status: Full  Pt Overview and Major Events to Date:  1/28: admitted for DKA 1/29: DKA resolved  Assessment and Plan: Jeffrey [name] is a 50 y.o. male presenting with DKA in the setting of not taking his insulin for the last 2 weeks. PMH is significant for DM II, obesity, alcohol abuse, cardiac arrest, HTN, HLD, anxiety and OSA.  DKA/poorly controlled DM-II: A1c 9.5 10/17/15. DKA in the setting of not taking his insulin since being discharged from the hospital on 10/19/15.  -IVF and Insulin per protocol for DKA.  - AG closed x2; sub q insulin; 5 u lantus and SSI (has required 24 units of novolog over last 24 hours). May need transition to home NPH today (13 units before breakfast and 7 units before dinner) -CBG per protocol  -Cardiac monitoring -Vital signs per protocol -Continue home gabapentin -Zofran as needed nausea -Case management for medication assistance (help establishing w/ MAP program)  Lactic acidosis: Resolved;  lactic acid 3.73 on admission. No signs of infections. Afebrile and normotensive.Likely 2/2 dehydration from DKA. - 3.73 >> 1.7 >> 1.4  Unintentional Ingestion: s/p ingestion of 2 tablespoons of [employer] carpet freshener. Poison Control contacted, no toxicity to be concerned about and no lab work necessary as main ingredient is sodium bicarb. - Per [employer], keep hydrated. - Will monitor  Epigastric Pain/GERD: Developed this morning (1/30). Unlikely cardiac cause, EKG WNL. Unlikely pancreatitis, Lipase WNL. Could be from chronic GERD.  - GI cocktail - Continue home PPI  AKI: Improved; 1.11 on 1/30 likely from dehydration secondary to DKA. Baseline ~1. Anticipate improvement with  rehydration - SLIV - resume home losartan  Hypokalemia: 3.4 today. Likely from insulin and fluids that were given - Check Mag - KDUR 40 BID  Alcohol abuse: Admitted previously for AMS due to EtOH intoxiciation. Last drink was last night. Admits to drinking a 6 pack of beer daily - CIWA as above - social work consult  Proteinuria: urine protein >300. Also has elevated urine protein in the past as well. This concerning for nephrotic syndrome. Also with >1000 glucose in urine which may be contributing. -Consider outpatient referral to nephrology -Will restart home ARB w/ resolution of AKI - 1/29  Hypertension- on losartan 50 mg daily and Toprol 100 mg daily at home. Normotensive. - Continue home Toprol - hold home losartan with AKI  Obstructive Sleep Apnea- History of refusing CPAP in past - Will order CPAP nightly.   Hematemesis: Hx of hematemesis, worsening in the last week or so. Was placed on PPI but patient has not been taking. Hgb stable at 14.7, better than baseline (likely from dehydration). Likely due to gastritis vs Mallory-Weiss tear. No varices present on endoscopy 10/2014.  - Repeat CBC on 1/30 - PPI - Will need outpatient GI referral, also due for colonoscopy  Hepatic Cirrhosis. Listed on problem list. Likely secondary to alcoholism. HCV and HBV negative last year. [name] on 03/09/2015 with hepatic steatosis - Continue home lactulose  Vision issues: reports flashes of purple-red colors. Not new. Likely diabetic retinopathy. Denies pain. -Outpt opthalmology referral  FEN/GI: Heart healthy carb modified diet. SLIV  Prophylaxis: SubQ Heparin  Disposition: Home once insulin regimen established  Subjective:  - Patient had episode of pain from legs  to chest, got 1 dose of Ativan and it resolved - Has epigastric discomfort this morning, burning pain in the middle of his chest - Ate all of his breakfast and has a good appetite - Would like to go home soon - Family coming  later today to visit - Denies shortness of breath, palpitations, or dizziness  Objective: Temp:  [97.7 F (36.5 C)-98.2 F (36.8 C)] 98.2 F (36.8 C) (01/30 0330) Pulse Rate:  [65-74] 72 (01/30 0550) BP: (115-138)/(80-105) 138/102 mmHg (01/30 0550) SpO2:  [99 %-100 %] 100 % (01/30 0550) Physical Exam: General: In NAD, A/O, appears comfortable Eyes: PERRLA, non icteric sclera ENTM: moist mucous membranes , poor dentition Cardiovascular: regular rate and rhythm, normal s1 and s2, no murmurs Respiratory: normal work of breathing, clear to auscultation bilaterally Abdomen: soft, normal bowel sounds, non distended,no masses palpated, tenderness to palpation of epigastric region Neuro: CN 2-12 intact, 5/5 strength in bilateral UE and LE, sensation intact throughout  Laboratory:  Recent Labs Lab 11/04/15 0833  WBC 10.8*  HGB 14.7  HCT 43.4  PLT 228    Recent Labs Lab 11/04/15 0833  11/05/15 0430 11/05/15 1322 11/06/15 0655  NA 134*  < > 134* 135 135  K 4.5  < > 3.0* 3.2* 3.4*  CL 84*  < > 94* 97* 98*  CO2 18*  < > 30 29 29   BUN 9  < > 8 5* <5*  CREATININE 1.57*  < > 1.24 1.08 1.11  CALCIUM 9.1  < > 8.1* 8.2* 8.2*  PROT 8.2*  --   --   --   --   BILITOT 1.5*  --   --   --   --   ALKPHOS 160*  --   --   --   --   ALT 39  --   --   --   --   AST 63*  --   --   --   --   GLUCOSE 395*  < > 149* 114* 135*  < > = values in this interval not displayed.  Imaging/Diagnostic Tests: XR Abd/Chest (11/04/15) IMPRESSION: 1. No acute cardiopulmonary disease. 2. Moderate colonic stool burden without evidence of enteric obstruction.   Carlyle Dolly, MD 11/06/2015, 7:57 AM PGY-1, [place] Intern pager: [phone], text pages welcome

## 2015-11-06 NOTE — Care Management Note (Signed)
Case Management Note  Patient Details  Name: EULICES HUTCHERSON MRN: [id number] Date of Birth: [birth year]  Subjective/Objective:      Date:11/06/15 admitted with DKA Spoke with patient at the bedside.  Introduced self as Tourist information centre manager and explained role in discharge planning and how to be reached.  Verified patient lives in town, with spouse. Expressed potential need for no other DME.  Verified patient anticipates to go home with family, at time of discharge and will have full-time  supervision by family at this time to best of their knowledge. Patient states he goes to [facility] Department to get his medications, he has orange card.  NCM spoke with Diabetic educator and she will speak with [facility] Department pharmacy to make sure MD prescribes what the pharmacy carry so that patient will be able to get his insulin filled.  Patient  is driven by family to MD appointments.  Verified patient has follows up with [facility].   Plan: CM will continue to follow for discharge planning and [facility] resources.               Action/Plan:   Expected Discharge Date:                  Expected Discharge Plan:  Home/Self Care  In-House Referral:     Discharge planning Services  CM Consult  Post Acute Care Choice:    Choice offered to:     DME Arranged:    DME Agency:     HH Arranged:    HH Agency:     Status of Service:  In process, will continue to follow  Medicare Important Message Given:    Date Medicare IM Given:    Medicare IM give by:    Date Additional Medicare IM Given:    Additional Medicare Important Message give by:     If discussed at [place] of Stay Meetings, dates discussed:    Additional Comments:  Zenon Mayo, RN 11/06/2015, 12:05 PM

## 2015-11-06 NOTE — Progress Notes (Addendum)
Initial Nutrition Assessment  DOCUMENTATION CODES:   Non-severe (moderate) malnutrition in context of chronic illness  INTERVENTION:    Glucerna Shake PO BID, each supplement provides 220 kcal and 10 grams of protein  NUTRITION DIAGNOSIS:   Malnutrition related to chronic illness as evidenced by mild depletion of body fat, mild depletion of muscle mass, percent weight loss (20% unintentional weight loss within the past year).  GOAL:   Patient will meet greater than or equal to 90% of their needs  MONITOR:   PO intake, Supplement acceptance, Labs, Weight trends, I & O's  REASON FOR ASSESSMENT:   Malnutrition Screening Tool    ASSESSMENT:   50 y.o. male presenting with accidental ingestion of carpet freshener and DKA in the setting of not taking his insulin for the last 2 weeks. PMH is significant for DM II, obesity, alcohol abuse, cardiac arrest, HTN, HLD, anxiety and OSA.  Labs reviewed: potassium and magnesium low.  Patient reports that he has had a decreasing appetite for ~2 years and has been eating poorly and losing weight. He prepares food for his family to eat and will make himself a plate, but then he feels like he will vomit if he puts the food in his mouth. He used to drink Ensure when he was working, now he is unable to afford it. Discussed with patient more affordable supplement options and ways to get supplement coupons. He has lost 20% of his weight over the past year, which is significant for the time frame. Nutrition-Focused physical exam completed. Findings are mild-moderate fat depletion, mild-moderate muscle depletion, and no edema. Patient with moderate PCM.   Diet Order:  Diet heart healthy/carb modified Room service appropriate?: Yes; Fluid consistency:: Thin  Skin:  Reviewed, no issues  Last BM:  unknown  Height:   Ht Readings from Last 1 Encounters:  11/04/15 5\' 10"  (1.778 m)    Weight:   Wt Readings from Last 1 Encounters:  11/04/15 207 lb  3.7 oz (94 kg)    Ideal Body Weight:  75.5 kg  BMI:  Body mass index is 29.73 kg/(m^2).  Estimated Nutritional Needs:   Kcal:  2000-2200  Protein:  100-115 gm  Fluid:  2-2.2 L  EDUCATION NEEDS:   Education needs addressed  Molli Barrows, [place], [place], [place] Pager [phone] After Hours Pager [phone]

## 2015-11-06 NOTE — Progress Notes (Signed)
Called to patients room for pain. He states the he had pain from legs up to chest 10/10 he pointed mostly to upper abdominal area. He also states he don't know if it is a "panic attack" or something else. EKG and vitals done MD notified and agrees to give a dose IV ativan considering he is also CIWA . Labs to be done to follow up. Patient is now saying pain eased up and feels more relaxed.

## 2015-11-06 NOTE — Progress Notes (Addendum)
Inpatient Diabetes Program Recommendations  AACE/ADA: New Consensus Statement on Inpatient Glycemic Control (2015)  Target Ranges:  Prepandial:   less than 140 mg/dL      Peak postprandial:   less than 180 mg/dL (1-2 hours)      Critically ill patients:  140 - 180 mg/dL   Results for Jeffrey [name], Jeffrey [name] (MRN [id number]) as of 11/06/2015 09:23  Ref. Range 11/04/2015 08:33  Sodium Latest Ref Range: 135-145 mmol/L 134 (L)  Potassium Latest Ref Range: 3.5-5.1 mmol/L 4.5  Chloride Latest Ref Range: 101-111 mmol/L 84 (L)  CO2 Latest Ref Range: 22-32 mmol/L 18 (L)  BUN Latest Ref Range: 6-20 mg/dL 9  Creatinine Latest Ref Range: 0.61-1.24 mg/dL 1.57 (H)  Calcium Latest Ref Range: 8.9-10.3 mg/dL 9.1  EGFR (Non-African Amer.) Latest Ref Range: >60 mL/min 49 (L)  EGFR (African American) Latest Ref Range: >60 mL/min 57 (L)  Glucose Latest Ref Range: 65-99 mg/dL 395 (H)  Anion gap Latest Ref Range: 5-15  32 (H)    Admit with: Accidental Ingestion of Carpet Refresher/ DKA (no insulin for 2 weeks- can't afford)  History: DM, ETOH, HTN  Home DM Meds: NPH insulin- 13 units AM/ 7 units PM        Metformin 500 mg bid  Current Insulin Orders: Lantus 5 units daily        Novolog SSI (0-24 units) Q4 hours     -Note through Chart Review, patient was discharged home on 10/18/15 with Rx for NPH insulin since he was unable to afford Lantus.  NPH insulin can be purchased for $25 per vial at [facility].  It appears as if patient did not buy his NPH insulin at [name] since H&P note states patient has not taken any insulin for 2 weeks.  -Note in MD notes that patient states he is trying to apply for medication assistance through the MAP program.  -Spoke with pharmacy representative from [facility] Department.  Pharmacy rep told me that patient needs to Re-enroll in the MAP program before he can have any Rxs filled through the Health Department.  Currently, the Health Department has Humulin Regular  insulin, Humulin NPH insulin,  and 70/30 insulin as well, however, patient will need to re-enroll in the MAP program before he can get insulin filled at this pharmacy.  Pharmacy also told me that patient had a Rx for NPH insulin but requested that the pharmacy call Family practice MD office to get Rx changed to 70/30 insulin.  Per Pharmacy rep, patient would not wait for pharmacy to call MD office, so patient left and stated he would fill the Rx at [facility].  -When I spoke with patient today, patient told me he is frustrated with getting his Rxs.  Stated he needed 70/30 insulin, however, per discharge summary from 10/18/15, patient was sent home with Rx for NPH insulin (not 70/30).   --Will follow patient during hospitalization--  [name] RN, MSN, CDE Diabetes Coordinator Inpatient Glycemic Control Team Team Pager: [phone] (8a-5p)

## 2015-11-07 LAB — CBC
HEMATOCRIT: 39.2 % (ref 39.0–52.0)
Hemoglobin: 12.9 g/dL — ABNORMAL LOW (ref 13.0–17.0)
MCH: 32.1 pg (ref 26.0–34.0)
MCHC: 32.9 g/dL (ref 30.0–36.0)
MCV: 97.5 fL (ref 78.0–100.0)
Platelets: 161 10*3/uL (ref 150–400)
RBC: 4.02 MIL/uL — AB (ref 4.22–5.81)
RDW: 15.5 % (ref 11.5–15.5)
WBC: 6.8 10*3/uL (ref 4.0–10.5)

## 2015-11-07 LAB — COMPREHENSIVE METABOLIC PANEL
ALT: 38 U/L (ref 17–63)
AST: 63 U/L — AB (ref 15–41)
Albumin: 3.5 g/dL (ref 3.5–5.0)
Alkaline Phosphatase: 137 U/L — ABNORMAL HIGH (ref 38–126)
Anion gap: 11 (ref 5–15)
BILIRUBIN TOTAL: 0.9 mg/dL (ref 0.3–1.2)
BUN: 8 mg/dL (ref 6–20)
CALCIUM: 9.5 mg/dL (ref 8.9–10.3)
CO2: 28 mmol/L (ref 22–32)
CREATININE: 1.14 mg/dL (ref 0.61–1.24)
Chloride: 92 mmol/L — ABNORMAL LOW (ref 101–111)
GFR calc Af Amer: >60 mL/min (ref >60)
Glucose, Bld: 280 mg/dL — ABNORMAL HIGH (ref 65–99)
Potassium: 4.5 mmol/L (ref 3.5–5.1)
Sodium: 131 mmol/L — ABNORMAL LOW (ref 135–145)
TOTAL PROTEIN: 7.5 g/dL (ref 6.5–8.1)

## 2015-11-07 LAB — GLUCOSE, CAPILLARY
GLUCOSE-CAPILLARY: 216 mg/dL — AB (ref 65–99)
Glucose-Capillary: 255 mg/dL — ABNORMAL HIGH (ref 65–99)
Glucose-Capillary: 274 mg/dL — ABNORMAL HIGH (ref 65–99)
Glucose-Capillary: 319 mg/dL — ABNORMAL HIGH (ref 65–99)
Glucose-Capillary: 186 mg/dL — ABNORMAL HIGH (ref 65–99)
Glucose-Capillary: 299 mg/dL — ABNORMAL HIGH (ref 65–99)

## 2015-11-07 LAB — MAGNESIUM: Magnesium: 1.8 mg/dL (ref 1.7–2.4)

## 2015-11-07 MED ORDER — INSULIN NPH (HUMAN) (ISOPHANE) 100 UNIT/ML ~~LOC~~ SUSP
10.0000 [IU] | Freq: Two times a day (BID) | SUBCUTANEOUS | Status: DC
  Administered 2015-11-07 (×2): 10 [IU] via SUBCUTANEOUS
  Filled 2015-11-07 (×2): qty 10

## 2015-11-07 MED ORDER — LORAZEPAM 0.5 MG PO TABS
0.5000 mg | ORAL_TABLET | Freq: Three times a day (TID) | ORAL | Status: DC | PRN
  Administered 2015-11-07 – 2015-11-08 (×3): 0.5 mg via ORAL
  Filled 2015-11-07 (×3): qty 1

## 2015-11-07 NOTE — Progress Notes (Addendum)
Inpatient Diabetes Program Recommendations  AACE/ADA: New Consensus Statement on Inpatient Glycemic Control (2015)  Target Ranges:  Prepandial:   less than 140 mg/dL      Peak postprandial:   less than 180 mg/dL (1-2 hours)      Critically ill patients:  140 - 180 mg/dL   Results for HERNY, HEWES (MRN [id number]) as of 11/07/2015 08:41  Ref. Range 11/05/2015 23:10 11/06/2015 03:33 11/06/2015 08:16 11/06/2015 11:49 11/06/2015 16:35 11/06/2015 20:04  Glucose-Capillary Latest Ref Range: 65-99 mg/dL 112 (H) 226 (H) 111 (H) 204 (H) 263 (H) 164 (H)   Results for RAMELL, FENTERS (MRN [id number]) as of 11/07/2015 08:41  Ref. Range 11/06/2015 23:45 11/07/2015 03:06 11/07/2015 07:23  Glucose-Capillary Latest Ref Range: 65-99 mg/dL 110 (H) 216 (H) 255 (H)     Admit with: Accidental Ingestion of Carpet Refresher/ DKA (no insulin for 2 weeks- can't afford)  History: DM, ETOH, HTN  Home DM Meds: NPH insulin- 13 units AM/ 7 units PM  Metformin 500 mg bid  Current Insulin Orders: NPH insulin- 10 units bid      MD- Please consider adding back Novolog Moderate SSI (0-15 units) TID AC + HS   Using Novolog SSI here in the hospital may help [name] better determine how much NPH insulin patient will need at home  Per [facility] Department pharmacy, they have Humulin NPH insulin, Humulin Regular insulin, and 70/30 insulin in stock at the health department.     --Will follow patient during hospitalization--  [name] RN, MSN, CDE Diabetes Coordinator Inpatient Glycemic Control Team Team Pager: [phone] (8a-5p)

## 2015-11-07 NOTE — Progress Notes (Signed)
Report received from [place], [place] for transfer to [phone]

## 2015-11-07 NOTE — Progress Notes (Signed)
Family Medicine Teaching Service Daily Progress Note Intern Pager: [phone]  Patient name: Jeffrey [name] Medical record number: [id number] Date of birth: September 06, 1966 Age: 50 y.o. Gender: male  Primary Care Provider: Cordelia Poche, MD Consultants: none Code Status: Full  Pt Overview and Major Events to Date:  1/28: admitted for DKA 1/29: DKA resolved  Assessment and Plan: Jeffrey [name] is a 50 y.o. male presenting with DKA in the setting of not taking his insulin for the last 2 weeks. PMH is significant for DM II, obesity, alcohol abuse, cardiac arrest, HTN, HLD, anxiety and OSA.  DKA/poorly controlled DM-II: A1c 9.5 10/17/15. DKA in the setting of not taking his insulin since being discharged from the hospital on 10/19/15.  -IVF and Insulin per protocol for DKA.  - AG closed x2; sub q insulin; transitioning to NPH today (10 units before breakfast and 10 units before dinner) -CBG per protocol  -Cardiac monitoring -Vital signs per protocol -Continue home gabapentin -Zofran as needed nausea -Case management for medication assistance (help establishing w/ MAP program) and Alcohol cessation program - Transfer to tele from SDU  Lactic acidosis: Resolved;  lactic acid 3.73 on admission. No signs of infections. Afebrile and normotensive.Likely 2/2 dehydration from DKA. - 3.73 >> 1.7 >> 1.4  Unintentional Ingestion: s/p ingestion of 2 tablespoons of [employer] carpet freshener. Poison Control contacted, no toxicity to be concerned about and no lab work necessary as main ingredient is sodium bicarb. - Per [employer], keep hydrated. - Will monitor  Epigastric Pain/GERD: Resolved. Unlikely cardiac cause, EKG WNL. Unlikely pancreatitis, Lipase WNL. Could be from chronic GERD. GI cocktail yesterday provided relief - Continue home PPI  AKI: Resolved. Baseline ~1.  - SLIV - resume home losartan  Hypokalemia and hypomagnesia: resolved. S/p 4 grams of Mag yesterday - Check Mag  daily - Daily BMP - KDUR 40 BID  Alcohol abuse: Admitted previously for AMS due to EtOH intoxiciation. Admits to drinking a 6 pack of beer daily. Is highly motivated to stop drinking alcohol and may be interested in joining a program. - CIWA as above - social work consult  Proteinuria: urine protein >300. Also has elevated urine protein in the past as well. This concerning for nephrotic syndrome. Also with >1000 glucose in urine which may be contributing. -Consider outpatient referral to nephrology -continue home ARB   Hypertension- on losartan 50 mg daily and Toprol 100 mg daily at home. Normotensive. - Continue home meds  Obstructive Sleep Apnea- History of refusing CPAP in past - Will order CPAP nightly.   Hematemesis: Hx of hematemesis, worsening in the last week or so. Was placed on PPI but patient has not been taking. Hgb stable . Likely due to gastritis vs Mallory-Weiss tear. No varices present on endoscopy 10/2014.  - PPI - Will need outpatient GI referral, also due for colonoscopy  Hepatic Cirrhosis. Listed on problem list. Likely secondary to alcoholism. HCV and HBV negative last year. [name] on 03/09/2015 with hepatic steatosis - Continue home lactulose  Vision issues: reports flashes of purple-red colors. Not new. Likely diabetic retinopathy. Denies pain. -Outpt opthalmology referral  FEN/GI: Heart healthy carb modified diet. SLIV  Prophylaxis: SubQ Heparin  Disposition: Home once insulin regimen established  Subjective:  - No complaints this morning - Epigastric discomfort resolved yesterday - Ate all of his breakfast and has a good appetite - Denies shortness of breath, palpitations, or dizziness - admits to feeling depressed over the last couple months and that  is what leads him to drink   Objective: Temp:  [97.5 F (36.4 C)-98 F (36.7 C)] 98 F (36.7 C) (01/31 0250) Pulse Rate:  [63-77] 63 (01/31 0249) BP: (90-133)/(73-94) 133/94 mmHg (01/31 0249) SpO2:   [96 %-99 %] 99 % (01/31 0249) Physical Exam: General: In NAD, A/O, appears comfortable Eyes: PERRLA, non icteric sclera ENTM: moist mucous membranes , poor dentition Cardiovascular: regular rate and rhythm, normal s1 and s2, no murmurs Respiratory: normal work of breathing, clear to auscultation bilaterally Abdomen: soft, normal bowel sounds, non distended,no masses palpated, non tender to palpation Neuro: CN 2-12 intact, 5/5 strength in bilateral UE and LE, sensation intact throughout  Laboratory:  Recent Labs Lab 11/04/15 0833 11/06/15 0655 11/07/15 0418  WBC 10.8* 7.6 6.8  HGB 14.7 11.6* 12.9*  HCT 43.4 34.0* 39.2  PLT 228 151 161    Recent Labs Lab 11/04/15 0833  11/05/15 1322 11/06/15 0655 11/07/15 0418  NA 134*  < > 135 135 131*  K 4.5  < > 3.2* 3.4* 4.5  CL 84*  < > 97* 98* 92*  CO2 18*  < > 29 29 28   BUN 9  < > 5* <5* 8  CREATININE 1.57*  < > 1.08 1.11 1.14  CALCIUM 9.1  < > 8.2* 8.2* 9.5  PROT 8.2*  --   --   --  7.5  BILITOT 1.5*  --   --   --  0.9  ALKPHOS 160*  --   --   --  137*  ALT 39  --   --   --  38  AST 63*  --   --   --  63*  GLUCOSE 395*  < > 114* 135* 280*  < > = values in this interval not displayed.  Imaging/Diagnostic Tests: XR Abd/Chest (11/04/15) IMPRESSION: 1. No acute cardiopulmonary disease. 2. Moderate colonic stool burden without evidence of enteric obstruction.   Carlyle Dolly, MD 11/07/2015, 6:56 AM PGY-1, [place] Intern pager: [phone], text pages welcome

## 2015-11-07 NOTE — Progress Notes (Signed)
Report called to Apolonio Schneiders, RN for [place] room 27.

## 2015-11-07 NOTE — Progress Notes (Signed)
NURSING PROGRESS NOTE  MILLIARD BORES [id number] Transfer Data: 11/07/2015 1:06 PM Attending Provider: Blane Ohara McDiarmid, MD [id number] Lonny Prude, MD Code Status: FULL  ALIOUNE [name] is a 50 y.o. male patient transferred from 3S -No acute distress noted.  -No complaints of shortness of breath.  -No complaints of chest pain.   Cardiac Monitoring: Box # 12 in place. Cardiac monitor yields:normal sinus rhythm.  Blood pressure 146/96, pulse 57, temperature 98.6 F (37 C), temperature source Oral, resp. rate 19, height 5\' 10"  (1.778 m), weight 94 kg (207 lb 3.7 oz), SpO2 100 %.   IV Fluids:  IV in place, occlusive dsg intact without redness, IV cath forearm right, condition patent and no redness none.   Allergies:  Lisinopril  Past Medical History:   has a past medical history of Hypertension; Blood transfusion (2014); Venous insufficiency (12/13/2011); Cardiac arrest ([place]) (12/07/2012); Anxiety; Acute renal insufficiency (12/08/2012); High cholesterol; OSA on CPAP (12/07/2012); Type II diabetes mellitus ([place]); Bleeding ulcer (2014); GERD (gastroesophageal reflux disease); and Anoxic encephalopathy (Jeffrey [name]).  Past Surgical History:   has past surgical history that includes Knee arthroscopy (Left, ~ 1999); Esophagogastroduodenoscopy (Left, 11/26/2012); Cardiovascular stress test (03/16/13); Cataract extraction w/ intraocular lens implant (Left, 03/07/2014); and Esophagogastroduodenoscopy (N/A, 10/10/2014).  Social History:   reports that he has never smoked. His smokeless tobacco use includes Snuff. He reports that he drinks about 3.6 oz of alcohol per week. He reports that he does not use illicit drugs.  Skin: intact  Patient/Family orientated to room. Information packet given to patient/family. Admission inpatient armband information verified with patient/family to include name and date of birth and placed on patient arm. Side rails up x 2, fall assessment and education completed with patient/family.  Patient/family able to verbalize understanding of risk associated with falls and verbalized understanding to call for assistance before getting out of bed. Call light within reach. Patient/family able to voice and demonstrate understanding of unit orientation instructions.    Will continue to evaluate and treat per MD orders.

## 2015-11-07 NOTE — Progress Notes (Signed)
UR COMPLETED  

## 2015-11-07 NOTE — Care Management Note (Addendum)
Case Management Note  Patient Details  Name: JASSIAH LIBERTI MRN: [id number] Date of Birth: [birth year]  Subjective/Objective:   Patient has an orange card and he gets meds from [facility] Dept, which is not open on the weekends.  Before patient is discharged home NCM will need to check  With [facility] Dept  To make sure they have the meds that will prescribed for patient.  Also this NCM left voice mail  with Tama Headings at [phone] regarding patient's orange card, which will expire 2/28.  Tama Headings called back and states patient would just need to call and set up an appointment at family practice and bring his paperwork to get orange card renewed after 2/28 ( this is the date of expiration of orange card).              Action/Plan:   Expected Discharge Date:                  Expected Discharge Plan:  Home/Self Care  In-House Referral:     Discharge planning Services  CM Consult  Post Acute Care Choice:    Choice offered to:     DME Arranged:    DME Agency:     HH Arranged:    HH Agency:     Status of Service:  In process, will continue to follow  Medicare Important Message Given:    Date Medicare IM Given:    Medicare IM give by:    Date Additional Medicare IM Given:    Additional Medicare Important Message give by:     If discussed at [place] of Stay Meetings, dates discussed:    Additional Comments:  Zenon Mayo, RN 11/07/2015, 12:12 PM

## 2015-11-08 DIAGNOSIS — T5791XD Toxic effect of unspecified inorganic substance, accidental (unintentional), subsequent encounter: Secondary | ICD-10-CM

## 2015-11-08 LAB — CBC
HEMATOCRIT: 38.7 % — AB (ref 39.0–52.0)
Hemoglobin: 13.1 g/dL (ref 13.0–17.0)
MCH: 33.2 pg (ref 26.0–34.0)
MCHC: 33.9 g/dL (ref 30.0–36.0)
MCV: 98.2 fL (ref 78.0–100.0)
PLATELETS: 165 10*3/uL (ref 150–400)
RBC: 3.94 MIL/uL — AB (ref 4.22–5.81)
RDW: 15.4 % (ref 11.5–15.5)
WBC: 6.2 10*3/uL (ref 4.0–10.5)

## 2015-11-08 LAB — GLUCOSE, CAPILLARY
GLUCOSE-CAPILLARY: 284 mg/dL — AB (ref 65–99)
Glucose-Capillary: 226 mg/dL — ABNORMAL HIGH (ref 65–99)
Glucose-Capillary: 113 mg/dL — ABNORMAL HIGH (ref 65–99)

## 2015-11-08 LAB — MAGNESIUM: Magnesium: 1.6 mg/dL — ABNORMAL LOW (ref 1.7–2.4)

## 2015-11-08 MED ORDER — INSULIN NPH (HUMAN) (ISOPHANE) 100 UNIT/ML ~~LOC~~ SUSP
15.0000 [IU] | Freq: Two times a day (BID) | SUBCUTANEOUS | Status: DC
  Administered 2015-11-08: 15 [IU] via SUBCUTANEOUS
  Filled 2015-11-08: qty 10

## 2015-11-08 MED ORDER — MAGNESIUM SULFATE 2 GM/50ML IV SOLN
2.0000 g | Freq: Once | INTRAVENOUS | Status: AC
  Administered 2015-11-08: 2 g via INTRAVENOUS
  Filled 2015-11-08: qty 50

## 2015-11-08 MED ORDER — INSULIN NPH (HUMAN) (ISOPHANE) 100 UNIT/ML ~~LOC~~ SUSP: 17.0000 [IU] | Freq: Two times a day (BID) | SUBCUTANEOUS | Status: DC

## 2015-11-08 NOTE — Discharge Instructions (Signed)
You were admitted to the hospital and treated for diabetic ketoacidosis. We have given you insulin and fluids. Please take your medicines as prescribed. You will be taking 17 units of NPH insulin at breakfast and at dinner.  Please check your glucose this evening and tomorrow morning and write the numbers down. [facility], please bring the numbers with you. Your appointment time is listed above. We will need to re-check your glucose at your appointment and make sure it is not too high.   It has been a pleasure caring for you, take care!  Diabetic Ketoacidosis Diabetic ketoacidosis is a life-threatening complication of diabetes. If it is not treated, it can cause severe dehydration and organ damage and can lead to a coma or death. CAUSES This condition develops when there is not enough of the hormone insulin in the body. Insulin helps the body to break down sugar for energy. Without insulin, the body cannot break down sugar, so it breaks down fats instead. This leads to the production of acids that are called ketones. Ketones are poisonous at high levels. This condition can be triggered by:  Stress on the body that is brought on by an illness.  Medicines that raise blood glucose levels.  Not taking diabetes medicine. SYMPTOMS Symptoms of this condition include:  Fatigue.  Weight loss.  Excessive thirst.  Light-headedness.  Fruity or sweet-smelling breath.  Excessive urination.  Vision changes.  Confusion or irritability.  Nausea.  Vomiting.  Rapid breathing.  Abdominal pain.  Feeling flushed. DIAGNOSIS This condition is diagnosed based on a medical history, a physical exam, and blood tests. You may also have a urine test that checks for ketones. TREATMENT This condition may be treated with:  Fluid replacement. This may be done to correct dehydration.  Insulin injections. These may be given through the skin or  through an IV tube.  Electrolyte replacement. Electrolytes, such as potassium and sodium, may be given in pill form or through an IV tube.  Antibiotic medicines. These may be prescribed if your condition was caused by an infection. HOME CARE INSTRUCTIONS Eating and Drinking  Drink enough fluids to keep your urine clear or pale yellow.  If you cannot eat, alternate between drinking fluids with sugar (such as juice) and salty fluids (such as broth or bouillon).  If you can eat, follow your usual diet and drink sugar-free liquids, such as water. Other Instructions  Take insulin as directed by your health care provider. Do not skip insulin injections. Do not use expired insulin.  If your blood sugar is over 240 mg/dL, monitor your urine ketones every 4-6 hours.  If you were prescribed an antibiotic medicine, finish all of it even if you start to feel better.  Rest and exercise only as directed by your health care provider.  If you get sick, call your health care provider and begin treatment quickly. Your body often needs extra insulin to fight an illness.  Check your blood glucose levels regularly. If your blood glucose is high, drink plenty of fluids. This helps to flush out ketones. SEEK MEDICAL CARE IF:  Your blood glucose level is too high or too low.  You have ketones in your urine.  You have a fever.  You cannot eat.  You cannot tolerate fluids.  You have been vomiting for more than 2 hours.  You continue to have symptoms of this condition.  You develop new symptoms. [place]  IF:  Your blood glucose levels continue to be high (elevated).  Your monitor reads "high" even when you are taking insulin.  You faint.  You have chest pain.  You have trouble breathing.  You have a sudden, severe headache.  You have sudden weakness in one arm or one leg.  You have sudden trouble speaking or swallowing.  You have vomiting or diarrhea that gets  worse after 3 hours.  You feel severely fatigued.  You have trouble thinking.  You have abdominal pain.  You are severely dehydrated. Symptoms of severe dehydration include:  Extreme thirst.  Dry mouth.  Blue lips.  Cold hands and feet.  Rapid breathing.   This information is not intended to replace advice given to you by your health care provider. Make sure you discuss any questions you have with your health care provider.   Document Released: 09/20/2000 Document Revised: 02/07/2015 Document Reviewed: 08/31/2014 Elsevier Interactive Patient Education [employer].

## 2015-11-08 NOTE — Progress Notes (Signed)
CSW consulted regarding ETOH/substance use. Substance abuse CSW has seen the patient on his last admission on 10/17/15 and provided the patient with outpatient and residential treatment resources. Please consult CSW again if patient no longer has those resources.  CSW signing off.  Percell Locus Keilana Morlock LCSWA [phone]

## 2015-11-08 NOTE — Progress Notes (Signed)
Family Medicine Teaching Service Daily Progress Note Intern Pager: [phone]  Patient name: Jeffrey [name] Medical record number: [id number] Date of birth: Apr 17, 1966 Age: 50 y.o. Gender: male  Primary Care Provider: Cordelia Poche, MD Consultants: none Code Status: Full  Pt Overview and Major Events to Date:  1/28: admitted for DKA 1/29: DKA resolved  Assessment and Plan: Jeffrey [name] is a 51 y.o. male presenting with DKA in the setting of not taking his insulin for the last 2 weeks. PMH is significant for DM II, obesity, alcohol abuse, cardiac arrest, HTN, HLD, anxiety and OSA.  DKA/poorly controlled DM-II: A1c 9.5 10/17/15. DKA in the setting of not taking his insulin since being discharged from the hospital on 10/19/15. DKA has resolved. -NPH 15 units before breakfast and 15 units before dinner -CBG per protocol  -Cardiac monitoring -Vital signs per protocol -Continue home gabapentin -Zofran as needed nausea -Case management for medication assistance (help establishing w/ [facility] program) and Alcohol cessation program  Lactic acidosis: Resolved;  lactic acid 3.73 on admission. No signs of infections. Afebrile and normotensive.Likely 2/2 dehydration from DKA. - 3.73 >> 1.7 >> 1.4  Unintentional Ingestion: s/p ingestion of 2 tablespoons of [employer] carpet freshener. Poison Control contacted, no toxicity to be concerned about and no lab work necessary as main ingredient is sodium bicarb. - Per [employer], keep hydrated. - Will monitor  Epigastric Pain/GERD: Resolved. Unlikely cardiac cause, EKG WNL. Unlikely pancreatitis, Lipase WNL. Could be from chronic GERD. GI cocktail yesterday provided relief - Continue home PPI  AKI: Resolved. Baseline ~1.  - SLIV - home losartan  Hypokalemia and hypomagnesia: hypokalemia resolved. Mag, 1.6 today - 2 G mag today  Alcohol abuse: Admitted previously for AMS due to EtOH intoxiciation. Admits to drinking a 6 pack of beer  daily. Is highly motivated to stop drinking alcohol and may be interested in joining a program. - CIWA as above - social work consult  Proteinuria: urine protein >300. Also has elevated urine protein in the past as well. This concerning for nephrotic syndrome. Also with >1000 glucose in urine which may be contributing. -Consider outpatient referral to nephrology -continue home ARB   Hypertension- on losartan 50 mg daily and Toprol 100 mg daily at home. Normotensive. - Continue home meds  Obstructive Sleep Apnea- History of refusing CPAP in past - Will order CPAP nightly.   Hematemesis: Hx of hematemesis, worsening in the last week or so. Was placed on PPI but patient has not been taking. Hgb stable . Likely due to gastritis vs Mallory-Weiss tear. No varices present on endoscopy 10/2014.  - PPI - Will need outpatient GI referral, also due for colonoscopy  Hepatic Cirrhosis. Listed on problem list. Likely secondary to alcoholism. HCV and HBV negative last year. [name] on 03/09/2015 with hepatic steatosis - Continue home lactulose  Vision issues: reports flashes of purple-red colors. Not new. Likely diabetic retinopathy. Denies pain. -Outpt opthalmology referral  FEN/GI: Heart healthy carb modified diet. SLIV  Prophylaxis: SubQ Heparin  Disposition: Home once insulin regimen established  Subjective:  - No complaints this morning - Denies epigastric discomfort, shortness of breath, palpitations, or dizziness - Is concerned that his glucose was high overnight  Objective: Temp:  [97.7 F (36.5 C)-98.6 F (37 C)] 97.8 F (36.6 C) (02/01 0621) Pulse Rate:  [57-69] 60 (02/01 0621) Resp:  [18-20] 20 (02/01 0621) BP: (98-157)/(63-103) 157/95 mmHg (02/01 0621) SpO2:  [99 %-100 %] 100 % (02/01 [id number]) Physical Exam: General:  In NAD, A/O, appears comfortable Eyes: PERRLA, non icteric sclera ENTM: moist mucous membranes , poor dentition Cardiovascular: regular rate and rhythm, normal s1  and s2, no murmurs Respiratory: normal work of breathing, clear to auscultation bilaterally Abdomen: soft, normal bowel sounds, non distended,no masses palpated, non tender to palpation Neuro: CN 2-12 intact, 5/5 strength in bilateral UE and LE, sensation intact throughout  Laboratory:  Recent Labs Lab 11/06/15 0655 11/07/15 0418 11/08/15 0752  WBC 7.6 6.8 6.2  HGB 11.6* 12.9* 13.1  HCT 34.0* 39.2 38.7*  PLT 151 161 165    Recent Labs Lab 11/04/15 0833  11/05/15 1322 11/06/15 0655 11/07/15 0418  NA 134*  < > 135 135 131*  K 4.5  < > 3.2* 3.4* 4.5  CL 84*  < > 97* 98* 92*  CO2 18*  < > 29 29 28   BUN 9  < > 5* <5* 8  CREATININE 1.57*  < > 1.08 1.11 1.14  CALCIUM 9.1  < > 8.2* 8.2* 9.5  PROT 8.2*  --   --   --  7.5  BILITOT 1.5*  --   --   --  0.9  ALKPHOS 160*  --   --   --  137*  ALT 39  --   --   --  38  AST 63*  --   --   --  63*  GLUCOSE 395*  < > 114* 135* 280*  < > = values in this interval not displayed.  Imaging/Diagnostic Tests: XR Abd/Chest (11/04/15) IMPRESSION: 1. No acute cardiopulmonary disease. 2. Moderate colonic stool burden without evidence of enteric obstruction.   Carlyle Dolly, MD 11/08/2015, 8:07 AM PGY-1, [place] Intern pager: [phone], text pages welcome

## 2015-11-08 NOTE — Discharge Summary (Signed)
[facility] Discharge Summary  Patient name: Jeffrey [name] Medical record number: [id number] Date of birth: [birth year] Age: 50 y.o. Gender: male Date of Admission: 11/04/2015  Date of Discharge: 11/08/15 Admitting Physician: Blane Ohara McDiarmid, MD  Primary Care Provider: Cordelia Poche, MD Consultants: none  Indication for Hospitalization: DKA   Discharge Diagnoses/Problem List:  Patient Active Problem List   Diagnosis Date Noted  . Ingestion of substance 11/04/2015  . Anxiety   . OSA (obstructive sleep apnea)   . Diabetic ketoacidosis without coma associated with type 2 diabetes mellitus ([facility])   . Essential hypertension   . DKA (diabetic ketoacidoses) ([facility]) 10/15/2015  . Lactic acidosis 09/13/2015  . Altered mental status 09/12/2015  . Diabetic polyneuropathy associated with type 2 diabetes mellitus ([facility]) 08/04/2015  . Hepatic cirrhosis ([place]) 03/22/2015  . Ataxia 03/20/2015  . Aphasia   . Elevated liver enzymes   . Toxic metabolic encephalopathy [id number]  . Dysarthria 03/09/2015  . AKI (acute kidney injury) ([facility])   . Alcohol intoxication ([place])   . Abdominal pain 11/17/2014  . Acute blood loss anemia 11/17/2014  . Hematemesis with nausea   . Alcohol abuse   . Hyperkalemia   . Tachycardia   . Hematemesis 11/16/2014  . Gastrointestinal hemorrhage with melena 10/10/2014  . Erosive esophagitis 10/10/2014  . HLD (hyperlipidemia)   . Anemia due to other cause   . Hyperglycemia   . Syncope and collapse   . Dehydration 05/18/2014  . OSA on CPAP 12/28/2012  . Pulmonary edema 12/08/2012  . Hypoglycemia 12/07/2012  . Cardiac arrest ([facility]) 12/07/2012  . Obesity 12/07/2012  . Anoxic brain injury ([place]) 12/07/2012  . GI bleed 11/26/2012  . Venous insufficiency 12/13/2011  . INSOMNIA, CHRONIC 02/14/2009  . 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 ALCOHOL ABUSE, HX OF 11/10/2007  . ERECTILE DYSFUNCTION 04/14/2007  . DM type 2  goal A1C below 7.5 12/04/2006  . HYPERCHOLESTEROLEMIA 12/04/2006  . OBESITY, NOS 12/04/2006  . Anxiety state 12/04/2006  . HYPERTENSION, BENIGN SYSTEMIC 12/04/2006  . ARTHRITIS 12/04/2006     Disposition: Home  Discharge Condition: Stable  Discharge Exam: see previous progress note  Brief Hospital Course:  Jeffrey [name] is a 50 y.o. male who presented with DKA in the setting of not taking his insulin for the last 2 weeks. PMH is significant for DM II, obesity, alcohol abuse, cardiac arrest, HTN, HLD, anxiety and OSA.  Unintentional ingestion/DKA/poorly controlled DM-II:  Initially presented to ED due to unintentional ingestion of 2 tablespoons of [employer] carpet freshener. Poison Control contacted, no toxicity to be concerned about and no lab work necessary as main ingredient is sodium bicarb. However, ED labs concerning for DKA; BG to 395, VBG 7.383/36.4/37/21.7, bicarb 13, K 4.9 and anion gap of 35. UA with glucose to 1000, protein 100 & ketones >80. sCr of 1.57. He was given 1L of NS in ED and started IVF and insulin per protocol for DKA. DKA resolved. Initially was given Lantus and SSI but then started on NPH insulin for at home affordability. NPH was titrated up to insulin demand. Case management was consulted for medication assistance (help establishing w/ MAP program)  On day of discharge, glucose was in high 200's. Patient was instructed to take 17 units of NPH BID at home, record glucose at night and in the morning, and follow up with PCP office.   Lactic acidosis:  Initial lactic acid of 3.73 on admission. No  signs of infections. Afebrile and normotensive.Likely 2/2 dehydration from DKA. Resolved with IVF.   Epigastric Pain/GERD:  Continued on Protonix while in hospital. EKG WNL. Unlikely pancreatitis, Lipase WNL. GI cocktail yesterday provided relief. Continued PPI on discharge.   AKI:  Likely from dehydration secondary to DKA. Cr 1.57 on admission. Baseline ~1. Home  Losartan was held. AKI Improved with rehydration and home losartan resumed.   Hypokalemia and hypomagnesia:  Given KDUR and IV mag for repletion   Alcohol abuse:  Admitted previously for AMS due to EtOH intoxiciation. Admits to drinking a 6 pack of beer daily. Is highly motivated to stop drinking alcohol. CIWA protocol was initiated and social work was consulted.   Proteinuria:  Urine protein >300 on admission. Also has elevated urine protein in the past as well. This concerning for nephrotic syndrome. Also with >1000 glucose in urine which may be contributing. Home ARB was continued.   Hematemesis: Hx of hematemesis, worsening in the last week or so prior to admission. Was placed on PPI but patient was not taking. Hgb stable throughout hospitalization, PPI was continued. Likely due to gastritis vs Mallory-Weiss tear. No varices present on endoscopy 10/2014.   Vision issues:  Reported flashes of purple-red colors. Not new. Likely diabetic retinopathy. Denied pain.  All other chronic medical conditions stable throughout admission and managed with home regimens.   Issues for Follow Up:  1. T2DM: Will need glucose check at follow up. Patient instructed to check glucose at home and bring values to appointment. May need NPH dose adjustment.  2. Proteinuria: Consider outpatient referral to nephrology  3. Hematemesis: Will need outpatient GI referral, also due for colonoscopy 4. Vision issues: Will need outpatient opthalmology referral 5. Alcohol Cessation: Will need encouragement on continuing to discontinue alcohol use and accepting help. Was given resources and information while inpatient earlier in January  Significant Procedures: none  Significant Labs and Imaging:   Recent Labs Lab 11/06/15 0655 11/07/15 0418 11/08/15 0752  WBC 7.6 6.8 6.2  HGB 11.6* 12.9* 13.1  HCT 34.0* 39.2 38.7*  PLT 151 161 165    Recent Labs Lab 11/04/15 0833  11/05/15 0110 11/05/15 0430  11/05/15 1322 11/06/15 0655 11/07/15 0418 11/08/15 0752  NA 134*  < > 134* 134* 135 135 131*  --   K 4.5  < > 3.6 3.0* 3.2* 3.4* 4.5  --   CL 84*  < > 90* 94* 97* 98* 92*  --   CO2 18*  < > 27 30 29 29 28   --   GLUCOSE 395*  < > 210* 149* 114* 135* 280*  --   BUN 9  < > 9 8 5* <5* 8  --   CREATININE 1.57*  < > 1.34* 1.24 1.08 1.11 1.14  --   CALCIUM 9.1  < > 8.3* 8.1* 8.2* 8.2* 9.5  --   MG  --   --   --   --   --  0.9* 1.8 1.6*  ALKPHOS 160*  --   --   --   --   --  137*  --   AST 63*  --   --   --   --   --  63*  --   ALT 39  --   --   --   --   --  38  --   ALBUMIN 3.8  --   --   --   --   --  3.5  --   < > =  values in this interval not displayed.   Results/Tests Pending at Time of Discharge: none  Discharge Medications:    Medication List    TAKE these medications        aspirin EC 81 MG tablet  Take 81 mg by mouth at bedtime.     citalopram 20 MG tablet  Commonly known as:  CELEXA  Take 1 tablet (20 mg total) by mouth daily.     folic acid 1 MG tablet  Commonly known as:  FOLVITE  Take 1 tablet (1 mg total) by mouth daily.     gabapentin 300 MG capsule  Commonly known as:  NEURONTIN  Take 1 capsule (300 mg total) by mouth 3 (three) times daily.     insulin NPH Human 100 UNIT/ML injection  Commonly known as:  HUMULIN N,NOVOLIN N  Inject 0.17 mLs (17 Units total) into the skin 2 (two) times daily at 8 am and 10 pm.     Insulin Syringes (Disposable) U-100 0.3 ML Misc  Use one to inject insulin twice daily     lactulose 10 GM/15ML solution  Commonly known as:  CHRONULAC  Take 15 mLs (10 g total) by mouth daily.     LORazepam 0.5 MG tablet  Commonly known as:  ATIVAN  Take 1 tablet (0.5 mg total) by mouth every 8 (eight) hours as needed for anxiety or seizure.     losartan 50 MG tablet  Commonly known as:  COZAAR  Take 1 tablet (50 mg total) by mouth daily.     metFORMIN 500 MG tablet  Commonly known as:  GLUCOPHAGE  Take 1 tablet (500 mg total) by  mouth 2 (two) times daily with a meal.     metoprolol succinate 100 MG 24 hr tablet  Commonly known as:  TOPROL-XL  TAKE ONE TABLET BY MOUTH DAILY. TAKE WITH OR IMMEDIATELY FOLLOWING A MEAL     multivitamin with minerals Tabs tablet  Take 1 tablet by mouth daily.     omeprazole 20 MG capsule  Commonly known as:  PRILOSEC  Take 1 capsule (20 mg total) by mouth 2 (two) times daily before a meal.     polyvinyl alcohol 1.4 % ophthalmic solution  Commonly known as:  LIQUIFILM TEARS  Place 1 drop into both eyes every 4 (four) hours as needed for dry eyes.     traMADol 50 MG tablet  Commonly known as:  ULTRAM  Take 1 tablet (50 mg total) by mouth every 12 (twelve) hours as needed for severe pain.        Discharge Instructions: Please refer to Patient Instructions section of EMR for full details.  Patient was counseled important signs and symptoms that should prompt return to medical care, changes in medications, dietary instructions, activity restrictions, and follow up appointments.   Follow-Up Appointments:     Follow-up Information    Follow up with Beverlyn Roux, MD On 11/09/2015.   Specialty:  Family Medicine   Why:  hospital follow up at 2:30 PM   Contact information:   [place] [address] [phone]       Carlyle Dolly, MD 11/08/2015, 4:46 PM PGY-1, [place]

## 2015-11-09 ENCOUNTER — Encounter: Payer: Self-pay | Admitting: Family Medicine

## 2015-11-09 ENCOUNTER — Ambulatory Visit (INDEPENDENT_AMBULATORY_CARE_PROVIDER_SITE_OTHER): Payer: Self-pay | Admitting: Family Medicine

## 2015-11-09 VITALS — BP 150/97 | HR 76 | Temp 98.2°F | Ht 70.0 in | Wt 224.0 lb

## 2015-11-09 DIAGNOSIS — E0810 Diabetes mellitus due to underlying condition with ketoacidosis without coma: Secondary | ICD-10-CM

## 2015-11-09 DIAGNOSIS — E119 Type 2 diabetes mellitus without complications: Secondary | ICD-10-CM

## 2015-11-09 DIAGNOSIS — I10 Essential (primary) hypertension: Secondary | ICD-10-CM

## 2015-11-09 MED ORDER — INSULIN NPH (HUMAN) (ISOPHANE) 100 UNIT/ML ~~LOC~~ SUSP: 25.0000 [IU] | Freq: Two times a day (BID) | SUBCUTANEOUS | Status: DC

## 2015-11-09 MED ORDER — METOPROLOL TARTRATE 50 MG PO TABS: 50.0000 mg | ORAL_TABLET | Freq: Two times a day (BID) | ORAL | Status: DC

## 2015-11-10 NOTE — Assessment & Plan Note (Signed)
Hosp for DKA, CBG 376 when he got home yesterday, frequent checks and NPH doses, now 201. Feeling well, no lows, has taken 25 units of NPH so far today - switch to 25u BID, cautioned pt about dangers of hypoglycemia, pt understands, can articulate symptoms and management - working on getting lantus from [facility], has paperwork done, just needs to meet with their pharmacist to finalize, encouraged him to be proactive/pushy about this - f/u with PCP in 2 weeks

## 2015-11-10 NOTE — Progress Notes (Signed)
   Subjective:   Jeffrey [name] is a 50 y.o. male with a history of DM, HTN, OSA, alcoholism, cirrhosis here for hospital f/u.  Pt admitted from 1/28 to 2/1 for DKA. Pt discharge yesterday. Feeling well but when he got home from the hospital his glucose was 376 and it stayed up all night despite several doses of insulin. This morning he got it down to 200 with 25u of NPH instead of the 17u prescribed. He has not had any low numbers or symptoms. His UOP has normalized and he is drinking plenty of water.  Review of Systems:  Per HPI. All other systems reviewed and are negative.   PMH, PSH, Medications, Allergies, and FmHx reviewed and updated in EMR.  Social History: never smoker  Objective:  BP 150/97 mmHg  Pulse 76  Temp(Src) 98.2 F (36.8 C) (Oral)  Ht 5\' 10"  (1.778 m)  Wt 224 lb (101.606 kg)  BMI 32.14 kg/m2  Gen:  50 y.o. male in NAD HEENT: NCAT, MMM, EOMI, PERRL, anicteric sclerae CV: RRR, no MRG, no JVD Resp: Non-labored, CTAB, no wheezes noted Abd: Soft, NTND, BS present, no guarding or organomegaly Ext: WWP, no edema MSK: Full ROM, strength intact Neuro: Alert and oriented, speech normal      Chemistry      Component Value Date/Time   NA 131* 11/07/2015 0418   K 4.5 11/07/2015 0418   CL 92* 11/07/2015 0418   CO2 28 11/07/2015 0418   BUN 8 11/07/2015 0418   CREATININE 1.14 11/07/2015 0418   CREATININE 0.75 05/22/2015 1117   CREATININE 4.36* 10/17/2007 1248      Component Value Date/Time   CALCIUM 9.5 11/07/2015 0418   CALCIUM 8.8 10/20/2007 0535   ALKPHOS 137* 11/07/2015 0418   AST 63* 11/07/2015 0418   ALT 38 11/07/2015 0418   BILITOT 0.9 11/07/2015 0418      Lab Results  Component Value Date   WBC 6.2 11/08/2015   HGB 13.1 11/08/2015   HCT 38.7* 11/08/2015   MCV 98.2 11/08/2015   PLT 165 11/08/2015   Lab Results  Component Value Date   TSH 0.596 10/08/2014   Lab Results  Component Value Date   HGBA1C 9.5* 10/17/2015   Assessment & Plan:       Jeffrey [name] is a 50 y.o. male here for hospital f/u  DKA (diabetic ketoacidoses) ([place]) Hosp for DKA, CBG 376 when he got home yesterday, frequent checks and NPH doses, now 201. Feeling well, no lows, has taken 25 units of NPH so far today - switch to 25u BID, cautioned pt about dangers of hypoglycemia, pt understands, can articulate symptoms and management - working on getting lantus from [facility], has paperwork done, just needs to meet with their pharmacist to finalize, encouraged him to be proactive/pushy about this - f/u with PCP in 2 weeks  Essential hypertension Switch metop to tartrate and HD no longer carries succinate      Beverlyn Roux, MD, MPH [place] PGY-3 11/10/2015 10:46 AM

## 2015-11-10 NOTE — Assessment & Plan Note (Addendum)
Switch metop to tartrate as HD no longer carries succinate

## 2015-11-13 ENCOUNTER — Telehealth: Payer: Self-pay | Admitting: Family Medicine

## 2015-11-13 DIAGNOSIS — I10 Essential (primary) hypertension: Secondary | ICD-10-CM

## 2015-11-13 MED ORDER — METOPROLOL TARTRATE 50 MG PO TABS: 50.0000 mg | ORAL_TABLET | Freq: Two times a day (BID) | ORAL | Status: DC

## 2015-11-13 NOTE — Telephone Encounter (Signed)
Pt is here and states that provider that seen him last week, Dr. Sherril Cong, sent in a script for metoprolol.  Pt pharmacy, [facility] Is telling pt that it has not been received. Can we please resend script?  Sadie Reynolds, ASA

## 2015-11-13 NOTE — Telephone Encounter (Signed)
Rx resent to [facility].  Derl Barrow, RN

## 2015-11-16 ENCOUNTER — Telehealth: Payer: Self-pay | Admitting: *Deleted

## 2015-11-16 NOTE — Telephone Encounter (Signed)
Returned patient's call. States he's already been helped. No questions or concerns at this time. Velora Heckler, RN

## 2015-11-20 NOTE — Telephone Encounter (Signed)
Patient came into office to request brand name meds from PCP--finally approved for medication assistance with [facility] and [facility].  Has appt with [facility] next week and needs 90-day Rxs for Celexa, folic acid, Neurontin, Lantus, Novolog, Chronulac, Ativan, Toprol XL, Prilosec, Liquifilm Tears, and "cholesterol med".  No cholesterol med listed on med list.  Patient also states his CBGs have been elevated since changing to Novolog 70/30 from Lantus due to his being unable to afford his meds.  Will route med request to Dr. Lonny Prude and call patient back once Dr. Lonny Prude has written Rxs.  Patient will call back if he has not heard back from our office by Monday afternoon.  Burna Forts, BSN, RN-BC

## 2015-11-21 NOTE — Telephone Encounter (Signed)
Crystal from [facility] called stating they re-enrolled patient into [facility]. They are requesting a call back from the doctor to discuss what prescriptions are needed.  Please call [phone].  Derl Barrow, RN

## 2015-11-21 NOTE — Telephone Encounter (Signed)
Crystal from [facility] called back stating she was sorry she missed Dr. Lisbeth Ply call. She was on the other line with another office.  She need Rx for Lantus SoloStar, Novolog vials, Dexilant 30 or 60 mg in place of Prilosec and Benicar 20 mg in place of Losartan.  She also need to check and see if patient will continue metformin.  They do not carry control substances and patient can get all other medications [facility]. Please fax prescriptions to [facility] at [phone].   Derl Barrow, RN

## 2015-11-23 ENCOUNTER — Ambulatory Visit (INDEPENDENT_AMBULATORY_CARE_PROVIDER_SITE_OTHER): Payer: Self-pay | Admitting: Family Medicine

## 2015-11-23 ENCOUNTER — Encounter: Payer: Self-pay | Admitting: Family Medicine

## 2015-11-23 VITALS — BP 170/104 | HR 61 | Temp 98.2°F | Ht 70.0 in | Wt 222.4 lb

## 2015-11-23 DIAGNOSIS — E1310 Other specified diabetes mellitus with ketoacidosis without coma: Secondary | ICD-10-CM

## 2015-11-23 DIAGNOSIS — I10 Essential (primary) hypertension: Secondary | ICD-10-CM

## 2015-11-23 DIAGNOSIS — F1010 Alcohol abuse, uncomplicated: Secondary | ICD-10-CM

## 2015-11-23 DIAGNOSIS — Z23 Encounter for immunization: Secondary | ICD-10-CM

## 2015-11-23 DIAGNOSIS — G931 Anoxic brain damage, not elsewhere classified: Secondary | ICD-10-CM

## 2015-11-23 DIAGNOSIS — E1142 Type 2 diabetes mellitus with diabetic polyneuropathy: Secondary | ICD-10-CM

## 2015-11-23 DIAGNOSIS — R809 Proteinuria, unspecified: Secondary | ICD-10-CM | POA: Insufficient documentation

## 2015-11-23 DIAGNOSIS — K08109 Complete loss of teeth, unspecified cause, unspecified class: Secondary | ICD-10-CM

## 2015-11-23 LAB — POCT URINALYSIS DIPSTICK
BILIRUBIN UA: NEGATIVE
GLUCOSE UA: 250
Ketones, UA: 15
Leukocytes, UA: NEGATIVE
NITRITE UA: NEGATIVE
Protein, UA: 100
Spec Grav, UA: 1.025
Urobilinogen, UA: 0.2
pH, UA: 6

## 2015-11-23 LAB — POCT UA - MICROSCOPIC ONLY

## 2015-11-23 MED ORDER — LOSARTAN POTASSIUM 100 MG PO TABS: 100.0000 mg | ORAL_TABLET | Freq: Every day | ORAL | Status: DC

## 2015-11-23 MED ORDER — HYDROCHLOROTHIAZIDE 25 MG PO TABS: 25.0000 mg | ORAL_TABLET | Freq: Every day | ORAL | Status: DC

## 2015-11-23 NOTE — Assessment & Plan Note (Signed)
Uncontrolled  - advised that he increase his NPH to 25 U BID  - he has follow up scheduled with his PCP soon  - his [facility] card is about to expire so referral to Ophthalmology today  - he has appt to renew his [facility] card on March 3

## 2015-11-23 NOTE — Progress Notes (Signed)
Subjective:    Jeffrey [name] - 50 y.o. male MRN [id number]  Date of birth: 1966-08-21  CC diabetes and hypertension  HPI  Jeffrey [name] is here for diabetes and hypertension.  CHRONIC DIABETES Has recently been placed in the [facility] program and able to afford his medications. He was recently seen in follow-up and advised to increase his NPH to 25 units twice a day but is still taking 20 units twice a day. He reports that his blood sugars are still in the 200 range Disease Monitoring  Blood Sugar Ranges: 200's  Polyuria: yes   Visual problems: Has not been seen by an ophthalmologist in over 10 years   Medication Compliance: yes  Medication Side Effects  Hypoglycemia: no   Preventitive Health Care  Eye Exam: Referral to ophthalmology today  Foot Exam: Due October 2017  Diet pattern: Reports that he has given up red meat, pork, and several other things. HTN Disease Monitoring: Lifetime duration Home BP Monitoring elevated at home  Medications:Recently switched from metoprolol succinate to tartrate. Losartan Chest pain- none     Dyspnea- none Compliance-  yes.  Lightheadedness-  no   Edema- no   Alcohol abuse Reports that since he had medically detox while being admitted in the hospital, he has not drank any alcohol since being discharged. He denies 1 into be placed a support group. He reports were quitting for a long time and starting drinking again last year when his wife was undergoing therapy for her cancer. Reports that he has 4 children and all suffer from some form of handicap and he needs to quit drinking to help support them. He used to work as an Geneticist, [employer] but is currently trying to apply for disability.  Proteinuria He has a history of a normal renal ultrasound done in 2009. While he was admitted that was found to have nephrotic range proteinuria. Most recent urine is showing large hemoglobin as well. He denies any history of tobacco  smoking use or kidney stones. He does endorse using chewing tobacco He notes a increase urinary frequency  Loss of dentition. Reports that when he was intubated is teeth were knocked out.  He has not seen a dentist in over 10 years He is currently using chewing tobacco  Reports no dental pain  Health Maintenance:  - Referral to ophthalmology Health Maintenance Due  Topic Date Due  . OPHTHALMOLOGY EXAM  06/19/1974  . COLONOSCOPY  06/19/2014    -  reports that he has never smoked. His smokeless tobacco use includes Snuff. - Review of Systems: Per HPI. - Past Medical History: Patient Active Problem List   Diagnosis Date Noted  . Proteinuria 11/23/2015  . Loss of teeth 11/23/2015  . Anxiety   . OSA (obstructive sleep apnea)   . Diabetic ketoacidosis without coma associated with type 2 diabetes mellitus ([place])   . Essential hypertension   . DKA (diabetic ketoacidoses) ([place]) 10/15/2015  . Diabetic polyneuropathy associated with type 2 diabetes mellitus ([place]) 08/04/2015  . Hepatic cirrhosis ([place]) 03/22/2015  . Ataxia 03/20/2015  . Aphasia   . Elevated liver enzymes   . Dysarthria 03/09/2015  . AKI (acute kidney injury) ([place])   . Alcohol intoxication ([place])   . Acute blood loss anemia 11/17/2014  . Hematemesis with nausea   . Alcohol abuse   . Hematemesis 11/16/2014  . Gastrointestinal hemorrhage with melena 10/10/2014  . Erosive esophagitis 10/10/2014  . HLD (hyperlipidemia)   . Anemia due  to other cause   . Syncope and collapse   . OSA on CPAP 12/28/2012  . Pulmonary edema 12/08/2012  . Cardiac arrest ([place]) 12/07/2012  . Obesity 12/07/2012  . Anoxic brain injury ([place]) 12/07/2012  . GI bleed 11/26/2012  . Venous insufficiency 12/13/2011  . INSOMNIA, CHRONIC 02/14/2009  . 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 ALCOHOL ABUSE, HX OF 11/10/2007  . ERECTILE DYSFUNCTION 04/14/2007  . DM type 2 goal A1C below 7.5 12/04/2006  . HYPERCHOLESTEROLEMIA  12/04/2006  . OBESITY, NOS 12/04/2006  . Anxiety state 12/04/2006  . HYPERTENSION, BENIGN SYSTEMIC 12/04/2006  . ARTHRITIS 12/04/2006   - Medications: reviewed and updated Current Outpatient Prescriptions  Medication Sig Dispense Refill  . aspirin EC 81 MG tablet Take 81 mg by mouth at bedtime.     . citalopram (CELEXA) 20 MG tablet Take 1 tablet (20 mg total) by mouth daily. 30 tablet 3  . folic acid (FOLVITE) 1 MG tablet Take 1 tablet (1 mg total) by mouth daily. 30 tablet 0  . gabapentin (NEURONTIN) 300 MG capsule Take 1 capsule (300 mg total) by mouth 3 (three) times daily. 90 capsule 1  . hydrochlorothiazide (HYDRODIURIL) 25 MG tablet Take 1 tablet (25 mg total) by mouth daily. 30 tablet 3  . insulin NPH Human (HUMULIN N,NOVOLIN N) 100 UNIT/ML injection Inject 0.25 mLs (25 Units total) into the skin 2 (two) times daily at 8 am and 10 pm. 10 mL 11  . Insulin Syringes, Disposable, U-100 0.3 ML MISC Use one to inject insulin twice daily 100 each 0  . lactulose (CHRONULAC) 10 GM/15ML solution Take 15 mLs (10 g total) by mouth daily. 240 mL 0  . LORazepam (ATIVAN) 0.5 MG tablet Take 1 tablet (0.5 mg total) by mouth every 8 (eight) hours as needed for anxiety or seizure. 90 tablet 0  . losartan (COZAAR) 100 MG tablet Take 1 tablet (100 mg total) by mouth daily. 30 tablet 3  . metFORMIN (GLUCOPHAGE) 500 MG tablet Take 1 tablet (500 mg total) by mouth 2 (two) times daily with a meal. 60 tablet 1  . metoprolol (LOPRESSOR) 50 MG tablet Take 1 tablet (50 mg total) by mouth 2 (two) times daily. 180 tablet 3  . Multiple Vitamin (MULTIVITAMIN WITH MINERALS) TABS tablet Take 1 tablet by mouth daily.    [name] omeprazole (PRILOSEC) 20 MG capsule Take 1 capsule (20 mg total) by mouth 2 (two) times daily before a meal. 120 capsule 6  . polyvinyl alcohol (LIQUIFILM TEARS) 1.4 % ophthalmic solution Place 1 drop into both eyes every 4 (four) hours as needed for dry eyes.    . traMADol (ULTRAM) 50 MG tablet Take 1  tablet (50 mg total) by mouth every 12 (twelve) hours as needed for severe pain. 30 tablet 0   No current facility-administered medications for this visit.     Review of Systems See HPI     Objective:   Physical Exam BP 170/104 mmHg  Pulse 61  Temp(Src) 98.2 F (36.8 C) (Oral)  Ht 5\' 10"  (1.778 m)  Wt 222 lb 6.4 oz (100.88 kg)  BMI 31.91 kg/m2 Gen: NAD, alert, cooperative with exam,  HEENT: NCAT, loss of left central incisor, poor dentition throughout, supple neck CV: RRR, good S1/S2, no murmur, no edema,  Resp: CTABL, no wheezes, non-labored Neuro: no gross deficits.  Psych: alert and oriented  BP re-check: 170/104  Assessment & Plan:   HYPERTENSION, BENIGN SYSTEMIC Uncontrolled  -  increased losartan 100 mg. Advised that he can take two 50 mg losartan tabs until this rx runs out  - started HCTZ 25 mg daily  - will continue metoprolol tartrate   Diabetic polyneuropathy associated with type 2 diabetes mellitus ([place]) Uncontrolled  - advised that he increase his NPH to 25 U BID  - he has follow up scheduled with his PCP soon  - his orange card is about to expire so referral to Ophthalmology today  - he has appt to renew his orange card on March 3   Alcohol abuse Reports he hasn't drank since he was medically detox  Does not want to pursue Alcoholics Anonymous or integrative care at [facility]. Seems to have good insight into reasons as to why he needs to quit for his children and his wife. -  Counseled and congratulated on quitting his  Alcohol use -  Informed that if he seeks help with this then he can always come to [facility]     Proteinuria Has had previous protein greater than 300 and urine as well as glucosuria  Also found to have large hemoglobin in his urine -  Urine microscope and dipstick today -  microalbumin /creatinine ratio -  Would consider referral to nephrology in the future for proteinuria  - if still having large hemoglobin with >3 RBC's on microscope would  consider referral to Urology. No history of kidney stones or smoking tobacco but does using chewing tobacco.  -  Consider getting lipid panel has has not been done so in a while but also for nephrotic range workup -  If having kidney problems may need to switch losartan to a lower dose the keep on just for renal protection  Anoxic brain injury He also reports that he had amnesia from his cardiac arrest in 2014 and has seen Neurology outpatient for this.   He was requesting a referral today for continued therapy.  It appears that he was cleared from char review  - advised he can follow up with this with his PCP.     Loss of teeth Reports his teeth were knocked out from being intubated  Has not seen dentist in over 10 years  Uses chewing tobacco  - referral to dentist placed since he has orange card

## 2015-11-23 NOTE — Assessment & Plan Note (Signed)
Uncontrolled  - increased losartan 100 mg. Advised that he can take two 50 mg losartan tabs until this rx runs out  - started HCTZ 25 mg daily  - will continue metoprolol tartrate

## 2015-11-23 NOTE — Assessment & Plan Note (Signed)
Has had previous protein greater than 300 and urine as well as glucosuria  Also found to have large hemoglobin in his urine -  Urine microscope and dipstick today -  microalbumin /creatinine ratio -  Would consider referral to nephrology in the future for proteinuria  - if still having large hemoglobin with >3 RBC's on microscope would consider referral to Urology. No history of kidney stones or smoking tobacco but does using chewing tobacco.  -  Consider getting lipid panel has has not been done so in a while but also for nephrotic range workup -  If having kidney problems may need to switch losartan to a lower dose the keep on just for renal protection

## 2015-11-23 NOTE — Assessment & Plan Note (Signed)
He also reports that he had amnesia from his cardiac arrest in 2014 and has seen Neurology outpatient for this.   He was requesting a referral today for continued therapy.  It appears that he was cleared from char review  - advised he can follow up with this with his PCP.

## 2015-11-23 NOTE — Assessment & Plan Note (Signed)
Reports his teeth were knocked out from being intubated  Has not seen dentist in over 10 years  Uses chewing tobacco  - referral to dentist placed since he has orange card

## 2015-11-23 NOTE — Assessment & Plan Note (Addendum)
Reports he hasn't drank since he was medically detox  Does not want to pursue Alcoholics Anonymous or integrative care at [facility]. Seems to have good insight into reasons as to why he needs to quit for his children and his wife. -  Counseled and congratulated on quitting his  Alcohol use -  Informed that if he seeks help with this then he can always come to [facility]

## 2015-11-23 NOTE — Patient Instructions (Addendum)
Thank you for coming in,   I will call or send a letter with the results from today.   Please increase your losartan to 100 mg daily. You may take two of the 50 mg pills until this prescription runs out.   I am also starting hydrochlorothiazide for your blood pressure. This is a water pill so make sure that you take this in the morning.   Please increase your NPH to 25 U twice per day.   I have made a referral to the Dentist and Eye doctor.   Please make an appointment with Beryle Lathe when you leave today to renew your orange card.   Please bring all of your medications with you to each visit.   Sign up for My Chart to have easy access to your labs results, and communication with your Primary care physician   Please feel free to call with any questions or concerns at any time, at [phone]. --Dr. Raeford Razor  Diet Recommendations for Diabetes   Starchy (carb) foods include: Bread, rice, pasta, potatoes, corn, crackers, bagels, muffins, all baked goods.   Protein foods include: Meat, fish, poultry, eggs, dairy foods, and beans such as pinto and kidney beans (beans also provide carbohydrate).   1. Eat at least 3 meals and 1-2 snacks per day. Never go more than 4-5 hours while awake without eating.  2. Limit starchy foods to TWO per meal and ONE per snack. ONE portion of a starchy  food is equal to the following:   - ONE slice of bread (or its equivalent, such as half of a hamburger bun).   - 1/2 cup of a "scoopable" starchy food such as potatoes or rice.   - 1 OUNCE (28 grams) of starchy snack foods such as crackers or pretzels (look on label).   - 15 grams of carbohydrate as shown on food label.  3. Both lunch and dinner should include a protein food, a carb food, and vegetables.   - Obtain twice as many veg's as protein or carbohydrate foods for both lunch and dinner.   - Try to keep frozen veg's on hand for a quick vegetable serving.     - Fresh or frozen veg's are best.  4.  Breakfast should always include protein.

## 2015-11-24 LAB — MICROALBUMIN / CREATININE URINE RATIO
CREATININE, URINE: 265 mg/dL (ref 20–370)
MICROALB UR: 27.6 mg/dL
MICROALB/CREAT RATIO: 104 ug/mg{creat} — AB (ref <30)

## 2015-11-27 ENCOUNTER — Telehealth: Payer: Self-pay | Admitting: *Deleted

## 2015-11-27 DIAGNOSIS — I10 Essential (primary) hypertension: Secondary | ICD-10-CM

## 2015-11-27 DIAGNOSIS — E119 Type 2 diabetes mellitus without complications: Secondary | ICD-10-CM

## 2015-11-27 NOTE — Telephone Encounter (Signed)
Crystal from [facility] called requesting to change Losartan 100 mg to Benicar 20 mg.  Benicar is free through [facility].  Also she is requesting to change NPH to Lantus SoloStar.  Lantus SoloStar pens are free and Novolog vials are free through [facility].  Please advise.  Derl Barrow, RN

## 2015-11-28 ENCOUNTER — Encounter: Payer: Self-pay | Admitting: Family Medicine

## 2015-11-28 ENCOUNTER — Telehealth: Payer: Self-pay | Admitting: Family Medicine

## 2015-11-28 NOTE — Telephone Encounter (Signed)
Spoke with patient's wife. Advised that he have his urine checked again in 6 weeks to test for hematuria. If still present may need consider referral to urology.   Rosemarie Ax, MD PGY-3, [facility] 11/28/2015, 4:25 PM

## 2015-11-30 ENCOUNTER — Other Ambulatory Visit: Payer: Self-pay | Admitting: Internal Medicine

## 2015-11-30 MED ORDER — INSULIN GLARGINE 100 UNIT/ML SOLOSTAR PEN: 20.0000 [IU] | PEN_INJECTOR | Freq: Every morning | SUBCUTANEOUS | Status: DC

## 2015-11-30 MED ORDER — OLMESARTAN MEDOXOMIL 20 MG PO TABS: 20.0000 mg | ORAL_TABLET | Freq: Every day | ORAL | Status: DC

## 2015-11-30 NOTE — Telephone Encounter (Signed)
Discontinued losartan and NPH. Started Benicar (osemesartan) 20mg  and Lantus 15u. Please inform patient to start Lantus the day after he takes his last dose of NPH.

## 2015-12-01 NOTE — Telephone Encounter (Signed)
Spoke to pt. Informed him of the information below. Called and LM for Crystal from [facility] to make sure she go the new RX's. Ottis Stain, CMA

## 2015-12-01 NOTE — Telephone Encounter (Addendum)
Spoke to [employer] from [employer]. She wants to know if pt should still be taking Novolog? If so, Crystal needs a Rx. Can get dexilant in place of omeprazole to save the pt some money, again, would need a Rx. Should pt still be on HTZ?  Benicar does not have it. Please advise. Ottis Stain, CMA

## 2015-12-04 ENCOUNTER — Other Ambulatory Visit: Payer: Self-pay | Admitting: Family Medicine

## 2015-12-04 MED ORDER — DEXLANSOPRAZOLE 30 MG PO CPDR: 30.0000 mg | DELAYED_RELEASE_CAPSULE | Freq: Every day | ORAL | Status: DC

## 2015-12-04 MED ORDER — HYDROCHLOROTHIAZIDE 25 MG PO TABS: 25.0000 mg | ORAL_TABLET | Freq: Every day | ORAL | Status: DC

## 2015-12-04 NOTE — Telephone Encounter (Signed)
Prescribed Dexilant 30mg  daily and resent prescription for hydrochlorothiazide. No Novolog for now. Thanks!

## 2015-12-05 NOTE — Telephone Encounter (Signed)
Patient needs an appointment for refills

## 2015-12-05 NOTE — Telephone Encounter (Signed)
LVM for Crystal from [facility] to give me a call. Ottis Stain, CMA

## 2015-12-06 ENCOUNTER — Telehealth: Payer: Self-pay | Admitting: Family Medicine

## 2015-12-06 NOTE — Telephone Encounter (Signed)
Jeffrey [name] from the [facility] program called back to let us know that she had contacted the pt about meds and he stated that he had discussed with the PCP about getting his generic medication through [facility] so they will only be giving him his Lantus, Benecar and Dexlansoprazole. Forwarding to PCP as an [place].  [name], Jeffrey [name], [name]

## 2015-12-06 NOTE — Telephone Encounter (Signed)
Crystal from [facility] called back and i gave her the below information. [name], Yaviel Kloster D, [name]

## 2015-12-06 NOTE — Telephone Encounter (Signed)
Pt called referral to eye dr.

## 2015-12-08 ENCOUNTER — Ambulatory Visit: Payer: Self-pay

## 2015-12-10 NOTE — Telephone Encounter (Signed)
There is already a referral placed from 11/23/2015.

## 2015-12-11 ENCOUNTER — Ambulatory Visit (INDEPENDENT_AMBULATORY_CARE_PROVIDER_SITE_OTHER): Payer: Self-pay | Admitting: Family Medicine

## 2015-12-11 ENCOUNTER — Encounter: Payer: Self-pay | Admitting: Family Medicine

## 2015-12-11 VITALS — BP 180/112 | HR 66 | Temp 97.9°F | Wt 219.9 lb

## 2015-12-11 DIAGNOSIS — F32A Depression, unspecified: Secondary | ICD-10-CM

## 2015-12-11 DIAGNOSIS — F329 Major depressive disorder, single episode, unspecified: Secondary | ICD-10-CM

## 2015-12-11 DIAGNOSIS — R270 Ataxia, unspecified: Secondary | ICD-10-CM

## 2015-12-11 DIAGNOSIS — I10 Essential (primary) hypertension: Secondary | ICD-10-CM

## 2015-12-11 DIAGNOSIS — F419 Anxiety disorder, unspecified: Secondary | ICD-10-CM

## 2015-12-11 DIAGNOSIS — M25552 Pain in left hip: Secondary | ICD-10-CM

## 2015-12-11 MED ORDER — METOPROLOL TARTRATE 100 MG PO TABS: 100.0000 mg | ORAL_TABLET | Freq: Two times a day (BID) | ORAL | Status: DC

## 2015-12-11 NOTE — Patient Instructions (Signed)
Thank you for coming to see me today. It was a pleasure. Today we talked about:   Leg pain: I will get a hip x-ray  Hypertension: Your blood pressure was high today. We may need to adjust your medication. Please follow-up with a nurse visit in 3 days.  Falling: I will get your hip x-rays and we will follow-up from there  Depression: I want you to follow-up with a psychiatrist  Please make an appointment to see me in 2-4 weeks.  If you have any questions or concerns, please do not hesitate to call the office at [phone].  Sincerely,  Cordelia Poche, MD

## 2015-12-11 NOTE — Progress Notes (Signed)
    Subjective    Jeffrey [name] is a 50 y.o. male that presents for a follow-up visit for:   1. Leg pain: Left leg pain. Symptoms started three months ago. He reports pain in his groin area. He has had multiple falls but is unaware of any direct trauma. He has trouble independently lifting his leg. Pain worse when lying down. He is still able to ambulate.  2. Hypertension: Patient is adherent with metoprolol, hydrochlorothiazide and olmesartan daily. No chest pain or dyspnea.   3. Ataxia: Patient states he has episodes of falling and not realizing. He has had a history of this in the past that was thought to be secondary to his drinking. He states that he is unaware of his falls.  4. Depression/Anxiety: he states that his mood has been worsening. He reports frustration with forgetting. He is adherent with Celexa. No suicidal ideation  Social History  Substance Use Topics  . Smoking status: Never Smoker   . Smokeless tobacco: Current User    Types: Snuff  . Alcohol Use: 3.6 oz/week    6 Cans of beer per week    Allergies  Allergen Reactions  . Lisinopril Other (See Comments)    Pt reports nose bleed.    No orders of the defined types were placed in this encounter.    ROS  Per HPI   Objective   BP 192/110 mmHg  Pulse 66  Temp(Src) 97.9 F (36.6 C) (Oral)  Wt 219 lb 14.4 oz (99.746 kg)  Vital signs reviewed  General: Well appearing, no distress HEENT:   Head:  Normocephalic  Eyes: Pupils equal and reactive to light/accomodation. Extraocular movements intact bilaterally.  Ears: Tympanic membranes normal bilaterally.  Nose/Throat: Nares patent bilaterally. Oropharnx clear and moist.  Neck: No cervical adenopathy bilaterally Musculoskeletal: Left hip with inguinal tenderness. Strength exam and ROM exam limited by pain in the hip by patient.  Neuro: Alert, oriented, CN intact Psych: no suicidal ideation  Assessment and Plan    Essential hypertension Asymptomatic.  Currently not controlled. Recommend recheck in three days with the nurse. Will adjust medication regimen at that time.  Depression Long history. Patient with history of alcohol abuse and anoxic brain injury. Comorbid anxiety as well. Mood somewhat controlled with Celexa for which he states compliance. Recommend follow-up with psychiatrist.  Hip pain Possible hip pathology. Possibly contributing to falls. Although that may be secondary to central pathology (previous anoxic brain injury). Will obtain hip films to evaluate.  Ataxia Unsure if this is related to anoxic brain injury. Possibly related to hip as well. He states he has not been drinking. Will evaluate hip and follow-up closely.

## 2015-12-11 NOTE — Telephone Encounter (Signed)
This referral is on hold bc pt's orange card is expired. Pt had a renewal appt for 12/08/15 but did not have all necessary documents to renew. Pt's referral cannot be sent until pt renews orange card. There are 5 opto/ophtho referrals ahead of his so it will be a few months before he gets an appt.

## 2015-12-17 DIAGNOSIS — F32A Depression, unspecified: Secondary | ICD-10-CM | POA: Insufficient documentation

## 2015-12-17 DIAGNOSIS — M25559 Pain in unspecified hip: Secondary | ICD-10-CM | POA: Insufficient documentation

## 2015-12-17 DIAGNOSIS — F329 Major depressive disorder, single episode, unspecified: Secondary | ICD-10-CM | POA: Insufficient documentation

## 2015-12-17 NOTE — Assessment & Plan Note (Signed)
Asymptomatic. Currently not controlled. Recommend recheck in three days with the nurse. Will adjust medication regimen at that time.

## 2015-12-17 NOTE — Assessment & Plan Note (Signed)
Long history. Patient with history of alcohol abuse and anoxic brain injury. Comorbid anxiety as well. Mood somewhat controlled with Celexa for which he states compliance. Recommend follow-up with psychiatrist.

## 2015-12-17 NOTE — Assessment & Plan Note (Signed)
Unsure if this is related to anoxic brain injury. Possibly related to hip as well. He states he has not been drinking. Will evaluate hip and follow-up closely.

## 2015-12-17 NOTE — Assessment & Plan Note (Signed)
Possible hip pathology. Possibly contributing to falls. Although that may be secondary to central pathology (previous anoxic brain injury). Will obtain hip films to evaluate.

## 2016-01-01 ENCOUNTER — Ambulatory Visit (INDEPENDENT_AMBULATORY_CARE_PROVIDER_SITE_OTHER): Payer: Self-pay | Admitting: Family Medicine

## 2016-01-01 ENCOUNTER — Encounter: Payer: Self-pay | Admitting: Family Medicine

## 2016-01-01 VITALS — BP 177/99 | HR 73 | Temp 98.2°F | Ht 70.0 in | Wt 233.3 lb

## 2016-01-01 DIAGNOSIS — F411 Generalized anxiety disorder: Secondary | ICD-10-CM

## 2016-01-01 DIAGNOSIS — R55 Syncope and collapse: Secondary | ICD-10-CM

## 2016-01-01 DIAGNOSIS — I10 Essential (primary) hypertension: Secondary | ICD-10-CM

## 2016-01-01 DIAGNOSIS — F32A Depression, unspecified: Secondary | ICD-10-CM

## 2016-01-01 DIAGNOSIS — E119 Type 2 diabetes mellitus without complications: Secondary | ICD-10-CM

## 2016-01-01 DIAGNOSIS — F329 Major depressive disorder, single episode, unspecified: Secondary | ICD-10-CM

## 2016-01-01 MED ORDER — CITALOPRAM HYDROBROMIDE 20 MG PO TABS: 20.0000 mg | ORAL_TABLET | Freq: Every day | ORAL | Status: DC

## 2016-01-01 MED ORDER — INSULIN GLARGINE 100 UNIT/ML SOLOSTAR PEN: 15.0000 [IU] | PEN_INJECTOR | Freq: Every morning | SUBCUTANEOUS | Status: DC

## 2016-01-01 MED ORDER — GABAPENTIN 300 MG PO CAPS: 300.0000 mg | ORAL_CAPSULE | Freq: Three times a day (TID) | ORAL | Status: DC

## 2016-01-01 NOTE — Patient Instructions (Signed)
Thank you for coming to see me today. It was a pleasure. Today we talked about:   Depression: I am restarting your Celexa  Diabetes: Lower your Lantus to 15u daily. Take your metformin  Falls: I will refer you to the neurologist  Please make an appointment to see me in 2 weeks.  If you have any questions or concerns, please do not hesitate to call the office at [phone].  Sincerely,  Cordelia Poche, MD

## 2016-01-01 NOTE — Progress Notes (Deleted)
    Subjective    Jeffrey [name] is a 50 y.o. male that presents for a follow-up visit for:   1. Depressed mood: Patient states he is having depressed mood. He reports not going to see a psychiatrist. He reports not taking Celexa because he ran out of medication. He reports no suicidal ideation. He reports anhedonia as well.   2. Falls: patient is continuing to have syncopal falls. He has been seen for this in the past. He reports continued abstinence from alcohol. He has never checked his blood sugar when he has these black outs. Symptoms last only for one second. No associated chest pain, dyspnea or palpitations. He does not have urinary/bowel incontinence.  3. Diabetes mellitus: He reports blood sugars as low as 40s. He has reduced his dose to lantus 15u daily. He has not been taking his metformin because he states he was told by someone at [facility] not to take it. Fasting blood sugars are generally in the 70 range.  4. Hypertension: patient taking olmesartan. He is currently not taking hydrochlorothiazide. He has not picked up metoprolol yet.  Social History  Substance Use Topics  . Smoking status: Never Smoker   . Smokeless tobacco: Current User    Types: Snuff  . Alcohol Use: 3.6 oz/week    6 Cans of beer per week    Allergies  Allergen Reactions  . Lisinopril Other (See Comments)    Pt reports nose bleed.    No orders of the defined types were placed in this encounter.    ROS  Per HPI   Objective   BP 177/99 mmHg  Pulse 73  Temp(Src) 98.2 F (36.8 C) (Oral)  Ht 5\' 10"  (1.778 m)  Wt 233 lb 4.8 oz (105.824 kg)  BMI 33.48 kg/m2  Vital signs reviewed  General: Well appearing, no distress HEENT: *** Respiratory/Chest: *** Cardiovascular: *** Gastrointestinal: *** Genitourinary: ***    Musculoskeletal: *** Skin: *** Neuro: ***  Assessment and Plan    No problem-specific assessment & plan notes found for this encounter.

## 2016-01-02 ENCOUNTER — Ambulatory Visit
Admission: RE | Admit: 2016-01-02 | Discharge: 2016-01-02 | Disposition: A | Discharge status: Home / Self Care | Payer: No Typology Code available for payment source | Source: Ambulatory Visit | Attending: Family Medicine | Admitting: Family Medicine

## 2016-01-02 DIAGNOSIS — M25552 Pain in left hip: Secondary | ICD-10-CM

## 2016-01-03 NOTE — Assessment & Plan Note (Addendum)
This is a chronic issue. Patient has been worked up in the past and symptoms presumed secondary to previous anoxic brain injury. Will refer to neurology for further management of this issue. Does not appear to be related to musculoskeletal etiology. Heart is possible. Echo in January of 2016 without evidence of significant aortic valve disease. Symptoms do not appear related to arrhythmia. Patient still endorses abstinence from alcohol.

## 2016-01-03 NOTE — Progress Notes (Signed)
    Subjective    Jeffrey [name] is a 50 y.o. male that presents for a follow-up visit for:   1. Depressed mood: Patient states he is having depressed mood. He reports not going to see a psychiatrist. He reports not taking Celexa because he ran out of medication. He reports no suicidal ideation. He reports anhedonia as well.   2. Falls: patient is continuing to have syncopal falls. He has been seen for this in the past. He reports continued abstinence from alcohol. He has never checked his blood sugar when he has these black outs. Symptoms last only for one second. No associated chest pain, dyspnea or palpitations. He does not have urinary/bowel incontinence.  3. Diabetes mellitus: He reports blood sugars as low as 40s. He has reduced his dose to lantus 15u daily. He has not been taking his metformin because he states he was told by someone at [facility] not to take it. Fasting blood sugars are generally in the 70 range.  4. Hypertension: patient taking olmesartan. He is currently not taking hydrochlorothiazide. He has not picked up metoprolol yet.  Social History  Substance Use Topics  . Smoking status: Never Smoker   . Smokeless tobacco: Current User    Types: Snuff  . Alcohol Use: 3.6 oz/week    6 Cans of beer per week    Allergies  Allergen Reactions  . Lisinopril Other (See Comments)    Pt reports nose bleed.    No orders of the defined types were placed in this encounter.    ROS  Per HPI   Objective   BP 177/99 mmHg  Pulse 73  Temp(Src) 98.2 F (36.8 C) (Oral)  Ht 5\' 10"  (1.778 m)  Wt 233 lb 4.8 oz (105.824 kg)  BMI 33.48 kg/m2  Vital signs reviewed  General: Well appearing, no distress Respiratory/Chest: Clear to auscultation bilaterally. Unlabored work of breathing. No wheezing or rales. Cardiovascular: Regular rate and rhythm. Normal S1 and S2. No heart murmurs present. No extra heart sounds  Assessment and Plan    Syncope and collapse This is a chronic issue.  Patient has been worked up in the past and symptoms presumed secondary to previous anoxic brain injury. Will refer to neurology for further management of this issue. Does not appear to be related to musculoskeletal etiology. Heart is possible. Echo in January of 2016 without evidence of significant aortic valve disease. Symptoms do not appear related to arrhythmia. Patient still endorses abstinence from alcohol.  HYPERTENSION, BENIGN SYSTEMIC Uncontrolled, however patient is taking one of three medications. No changes today. Will follow-up in two weeks.  DM type 2 goal A1C below 7.5 Patient having recurrent low blood sugars. He titrated himself down to 15u daily. Will continue this lower dose. Explained to him that he should restart his metformin  Depression Patient with recurrent symptoms, although he has not been taking Celexa. Will restart this. Follow-up in 2 weeks. No SI or HI

## 2016-01-03 NOTE — Assessment & Plan Note (Addendum)
Uncontrolled, however patient is taking one of three medications. No changes today. Will follow-up in two weeks.

## 2016-01-03 NOTE — Assessment & Plan Note (Signed)
Patient with recurrent symptoms, although he has not been taking Celexa. Will restart this. Follow-up in 2 weeks. No SI or HI

## 2016-01-03 NOTE — Assessment & Plan Note (Signed)
Patient having recurrent low blood sugars. He titrated himself down to 15u daily. Will continue this lower dose. Explained to him that he should restart his metformin

## 2016-01-15 ENCOUNTER — Encounter: Payer: Self-pay | Admitting: Family Medicine

## 2016-01-15 ENCOUNTER — Ambulatory Visit (INDEPENDENT_AMBULATORY_CARE_PROVIDER_SITE_OTHER): Payer: No Typology Code available for payment source | Admitting: Family Medicine

## 2016-01-15 ENCOUNTER — Ambulatory Visit (HOSPITAL_COMMUNITY)
Admission: RE | Admit: 2016-01-15 | Discharge: 2016-01-15 | Disposition: A | Discharge status: Home / Self Care | Payer: No Typology Code available for payment source | Source: Ambulatory Visit | Attending: Family Medicine | Admitting: Family Medicine

## 2016-01-15 VITALS — BP 207/95 | HR 67 | Temp 97.6°F | Ht 70.0 in | Wt 225.7 lb

## 2016-01-15 DIAGNOSIS — E119 Type 2 diabetes mellitus without complications: Secondary | ICD-10-CM

## 2016-01-15 DIAGNOSIS — R55 Syncope and collapse: Secondary | ICD-10-CM

## 2016-01-15 DIAGNOSIS — I10 Essential (primary) hypertension: Secondary | ICD-10-CM

## 2016-01-15 DIAGNOSIS — M25561 Pain in right knee: Secondary | ICD-10-CM

## 2016-01-15 LAB — POCT GLYCOSYLATED HEMOGLOBIN (HGB A1C): HEMOGLOBIN A1C: 8.9

## 2016-01-15 MED ORDER — OMEPRAZOLE 20 MG PO CPDR: 20.0000 mg | DELAYED_RELEASE_CAPSULE | Freq: Every day | ORAL | Status: DC

## 2016-01-15 MED ORDER — METOPROLOL SUCCINATE ER 100 MG PO TB24: 100.0000 mg | ORAL_TABLET | Freq: Every day | ORAL | Status: DC

## 2016-01-15 MED ORDER — OLMESARTAN MEDOXOMIL 40 MG PO TABS
40.0000 mg | ORAL_TABLET | Freq: Every day | ORAL | Status: DC
Start: 2016-01-15 — End: 2016-11-05

## 2016-01-15 MED ORDER — POLYVINYL ALCOHOL 1.4 % OP SOLN: 1.0000 [drp] | OPHTHALMIC | Status: DC | PRN

## 2016-01-15 MED ORDER — GLUCOSE BLOOD VI STRP: ORAL_STRIP | Status: DC

## 2016-01-15 MED ORDER — NAPROXEN 500 MG PO TABS
500.0000 mg | ORAL_TABLET | Freq: Two times a day (BID) | ORAL | Status: DC
Start: 2016-01-15 — End: 2016-02-20

## 2016-01-15 MED ORDER — INSULIN PEN NEEDLE 31G X 8 MM MISC: Status: DC

## 2016-01-15 NOTE — Progress Notes (Signed)
    Subjective    OLUJIMI [name] is a 50 y.o. male that presents for a follow-up visit for:   1. Hypertension: patient adherent with metoprolol 100mg  BID, olmesartan 20mg  daily. No associated chest pain or dyspnea.   2. Knot on knee: Symptoms noticed about 2 months ago. Pain is described as sharp. He reports no known injury. He has a history of right knee arthroplasty in 1999 after having persistent effusions. He has tried some topical medication that he obtained from a previous hospital stay that helps in addition to [place]. He has chronic issues with stability with his knees.  3. Diabetes: He is adherent with Lantus. He is taking Lantus 10u daily. No hypoglycemia. No polyuria, polydipsia, polyphagia. He does not have a log of his blood sugars.  Social History  Substance Use Topics  . Smoking status: Never Smoker   . Smokeless tobacco: Current User    Types: Snuff  . Alcohol Use: 3.6 oz/week    6 Cans of beer per week    Allergies  Allergen Reactions  . Lisinopril Other (See Comments)    Pt reports nose bleed.    No orders of the defined types were placed in this encounter.    ROS  Per HPI   Objective   BP 207/95 mmHg  Pulse 67  Temp(Src) 97.6 F (36.4 C) (Oral)  Ht 5\' 10"  (1.778 m)  Wt 225 lb 11.2 oz (102.377 kg)  BMI 32.38 kg/m2  Vital signs reviewed  General: Fair appearing, no distress Respiratory/Chest: Clear to auscultation bilaterally. Unlabored work of breathing. No wheezing or rales. Cardiovascular: Regular rate and rhythm. Normal S1 and S2. No heart murmurs present. No extra heart sounds Musculoskeletal: Right knee with swelling on lateral aspect of knee posterior and inferiorly to kneecap. Area is tender without warmth or erythema.Swelling is not located on left side.   Assessment and Plan    HYPERTENSION, BENIGN SYSTEMIC Patient not controlled. Repeat blood pressure not obtained before patient left the office. Will have patient follow-up in one week  for recheck. Will increase olmesartan to 40 mg daily  DM type 2 goal A1C below 7.5 A1C 8.9 today and continues to improve. Continue current regimen since patient does not have logs today. Will need to see what his blood sugar is doing throughout the day to adjust basal.  Syncope and collapse Persistent problem. Previously attributed to patient's alcoholism. EKG and exam does not reveal etiology for syncope. Possibly related to previous anoxic brain injury, but will rule out arrhythmia first.  Right knee pain Will get x-rays. Will also refer to sports medicine for ultrasound. Conservative management in the mean time with elevation, compression, ice. Follow-up if worsens or has difficulty with ambulation. - DG Knee AP/LAT W/Sunrise Right; Future - Ambulatory referral to Sports Medicine

## 2016-01-15 NOTE — Patient Instructions (Signed)
Thank you for coming to see me today. It was a pleasure. Today we talked about:   Frequent falls/passing out: I am referring you to the cardiologist for evaluation.  Knee swelling: I will get an x-ray and ultrasound of your knee.  Diabetes: your A1C has improved but is still a little high. No changes today since you do not have your blood sugar numbers. I will discuss changes the next time you come in  Please make an appointment to see me in 2-4 weeks.  If you have any questions or concerns, please do not hesitate to call the office at [phone].  Sincerely,  Cordelia Poche, MD

## 2016-01-17 ENCOUNTER — Telehealth: Payer: Self-pay

## 2016-01-17 NOTE — Assessment & Plan Note (Signed)
Persistent problem. Previously attributed to patient's alcoholism. EKG and exam does not reveal etiology for syncope. Possibly related to previous anoxic brain injury, but will rule out arrhythmia first.

## 2016-01-17 NOTE — Assessment & Plan Note (Signed)
Patient not controlled. Repeat blood pressure not obtained before patient left the office. Will have patient follow-up in one week for recheck. Will increase olmesartan to 40 mg daily

## 2016-01-17 NOTE — Assessment & Plan Note (Signed)
A1C 8.9 today and continues to improve. Continue current regimen since patient does not have logs today. Will need to see what his blood sugar is doing throughout the day to adjust basal.

## 2016-01-17 NOTE — Telephone Encounter (Signed)
-----   Message from Mariel Aloe, MD sent at 01/05/2016 10:13 AM EDT ----- Please inform patient that there is no fracture. Just some mild arthritis.

## 2016-01-17 NOTE — Telephone Encounter (Signed)
Pt informed at apt with Dr. On 01/15/2016. Ottis Stain, CMA

## 2016-02-01 ENCOUNTER — Ambulatory Visit
Admission: RE | Admit: 2016-02-01 | Discharge: 2016-02-01 | Disposition: A | Discharge status: Home / Self Care | Payer: No Typology Code available for payment source | Source: Ambulatory Visit | Attending: Sports Medicine | Admitting: Sports Medicine

## 2016-02-01 ENCOUNTER — Encounter: Payer: Self-pay | Admitting: Sports Medicine

## 2016-02-01 ENCOUNTER — Ambulatory Visit (INDEPENDENT_AMBULATORY_CARE_PROVIDER_SITE_OTHER): Payer: Self-pay | Admitting: Sports Medicine

## 2016-02-01 VITALS — BP 174/108 | Ht 70.0 in | Wt 228.0 lb

## 2016-02-01 DIAGNOSIS — M25561 Pain in right knee: Secondary | ICD-10-CM

## 2016-02-02 ENCOUNTER — Other Ambulatory Visit: Payer: Self-pay | Admitting: *Deleted

## 2016-02-02 DIAGNOSIS — M25561 Pain in right knee: Secondary | ICD-10-CM

## 2016-02-02 NOTE — Progress Notes (Signed)
   Subjective:    Patient ID: Jeffrey [name], male    DOB: [birth year], 50 y.o.   MRN: [id number]  HPI chief complaint: Right knee pain  50 year old male comes in today complaining of several weeks of right knee pain. Patient believes that he may have injured his knee during a syncopal episode. He has multiple medical problems (please see problem list and medication). He has diffuse pain and swelling throughout the knee. Any sort of movement causes pain. He has difficulty bearing weight. He comes in today with a cane. He denies any prior surgeries to his right knee but he is status post left knee arthroscopy about 10 years ago and has done well postoperatively. He states his right knee pain is similar in nature to what he experienced with his left knee. No numbness or tingling. He is also getting some bilateral hip pain which he localizes to the lateral hip. Recent x-rays of his hips showed only some mild DJD.  Past medical history reviewed Medications reviewed Allergies reviewed    Review of Systems    as above Objective:   Physical Exam  Well-developed, well-nourished. No acute distress.  Right knee: Exam is limited by pain. Patient has limited active and passive range of motion. Trace to 1+ effusion. Diffuse tenderness to palpation. No erythema. Joint is not warm to touch. He is neurovascularly intact distally.  Examination of each hip shows tenderness to palpation diffusely along the lateral hip. Negative log roll bilaterally. Patient ambulates with the assistance of a cane.   X-rays of his hips show only mild DJD. X-rays of his right knee of also shows only some mild degenerative changes In the medial compartment.        Assessment & Plan:  Right knee pain status post fall  X-rays are fairly unremarkable in regards to the right knee. I will get an MRI to further evaluate. I did offer the patient some crutches but he prefers his cane. I also offered him a cortisone injection  but he refused. I've recommended a simple Ace wrap for compression. I reassured him that his hip x-rays show only mild DJD. I think his lateral hip pain is more compensatory pain rather than true hip pathology. I will call the patient with the results of his right knee MRI once available at which point we will delineate further treatment.

## 2016-02-07 ENCOUNTER — Encounter: Payer: Self-pay | Admitting: Cardiology

## 2016-02-07 ENCOUNTER — Ambulatory Visit (INDEPENDENT_AMBULATORY_CARE_PROVIDER_SITE_OTHER): Payer: No Typology Code available for payment source | Admitting: Cardiology

## 2016-02-07 VITALS — BP 137/93 | HR 70 | Ht 70.0 in | Wt 244.0 lb

## 2016-02-07 DIAGNOSIS — I10 Essential (primary) hypertension: Secondary | ICD-10-CM

## 2016-02-07 DIAGNOSIS — R55 Syncope and collapse: Secondary | ICD-10-CM

## 2016-02-07 DIAGNOSIS — W19XXXA Unspecified fall, initial encounter: Secondary | ICD-10-CM

## 2016-02-07 NOTE — Progress Notes (Signed)
Cardiology Office Note    Date:  02/07/2016   ID:  Jeffrey [name], DOB Mar 23, 1966, MRN [id number]  PCP:  Cordelia Poche, MD  Cardiologist:   Candee Furbish, MD     History of Present Illness:  Jeffrey [name] is a 50 y.o. male here for evaluation of syncope. According to previous office note from 01/15/16, this has been a persistent problem which is previously been attributed to patient's alcoholism. EKG and exam did not reveal any etiology for syncope. The thought was that he was possibly related to previous anoxic brain injury but he would like to rule out arrhythmia first.  Just loose it. Get to shaking. Falling down happens regardless. Sometimes. Started 01/2015 - when falls most of the time, he is awake. Sometimes he looses consciousness.   Last year started feeling better. But then in January 2017, came back. Worried about leaving home. Loose leg power. Gash on right arm in bathroom [place].   Back in 02/2013 had PEA arrest, Dr. Burt Knack saw him in clinic, exercise test as below reassuring. Unexplained reason for arrest, possibly neurologic, obstructive sleep apnea.  No longer drinking.   03/16/13 ETT Interpretation: normal - no evidence of ischemia by ST analysis  Echocardiogram 10/10/14:  - Left ventricle: The cavity size was normal. There was mild concentric hypertrophy. Systolic function was normal. The estimated ejection fraction was in the range of 60% to 65%. Wallmotion was normal; there were no regional wall motionabnormalities. There was an increased relative contribution ofatrial contraction to ventricular filling. Doppler parameters areconsistent with abnormal left ventricular relaxation (grade 1diastolic dysfunction). - Aortic valve: Mildly calcified leaflets. - Aorta: Calcified density in the descending aorta ? atheroma -recommend chest and abdominal CT angio for further evaluation. - Mitral valve: There was trivial regurgitation. - Left atrium: The atrium  was mildly dilated. - Tricuspid valve: There was trivial regurgitation.  Past Medical History  Diagnosis Date  . Hypertension   . Blood transfusion 2014    "related to bleeding ulcer"  . Venous insufficiency 12/13/2011  . Cardiac arrest ([place]) 12/07/2012    Anoxic encephalopathy  . Anxiety   . Acute renal insufficiency 12/08/2012  . High cholesterol   . OSA on CPAP 12/07/2012  . Type II diabetes mellitus ([place])   . Bleeding ulcer 2014  . GERD (gastroesophageal reflux disease)   . Anoxic encephalopathy ([place])   . AKI (acute kidney injury) ([place])   . ALCOHOL ABUSE, HX OF 11/10/2007    Past Surgical History  Procedure Laterality Date  . Knee arthroscopy Left ~ 1999  . Esophagogastroduodenoscopy Left 11/26/2012    Procedure: ESOPHAGOGASTRODUODENOSCOPY (EGD);  Surgeon: Wonda Horner, MD;  Location[facility] ENDOSCOPY;  Service: Endoscopy;  Laterality: Left;  . Cardiovascular stress test  03/16/13    Very Poor Exercise Tolerance; NON DIAGNOSTIC TEST  . Cataract extraction w/ intraocular lens implant Left 03/07/2014    [place] @ [facility]  . Esophagogastroduodenoscopy N/A 10/10/2014    Procedure: ESOPHAGOGASTRODUODENOSCOPY (EGD);  Surgeon: Ladene Artist, MD;  Location: [facility] ENDOSCOPY;  Service: Endoscopy;  Laterality: N/A;    Current Medications: Outpatient Prescriptions Prior to Visit  Medication Sig Dispense Refill  . aspirin EC 81 MG tablet Take 81 mg by mouth at bedtime.     . citalopram (CELEXA) 20 MG tablet Take 1 tablet (20 mg total) by mouth daily. 30 tablet 3  . folic acid (FOLVITE) 1 MG tablet Take 1 tablet (1 mg total) by mouth daily. [place]  tablet 0  . gabapentin (NEURONTIN) 300 MG capsule Take 1 capsule (300 mg total) by mouth 3 (three) times daily. 90 capsule 1  . glucose blood test strip Use as instructed 100 each 12  . Insulin Glargine (LANTUS) 100 UNIT/ML Solostar Pen Inject 15 Units into the skin every morning. 15 mL 11  . Insulin Pen Needle 31G X 8 MM MISC BD UltraFine III Pen  Needles. For use with insulin pen device. Inject insulin 6 x daily 200 each 3  . Insulin Syringes, Disposable, U-100 0.3 ML MISC Use one to inject insulin twice daily 100 each 0  . metoprolol (LOPRESSOR) 100 MG tablet Take 1 tablet (100 mg total) by mouth 2 (two) times daily. 90 tablet 3  . Multiple Vitamin (MULTIVITAMIN WITH MINERALS) TABS tablet Take 1 tablet by mouth daily.    . naproxen (NAPROSYN) 500 MG tablet Take 1 tablet (500 mg total) by mouth 2 (two) times daily with a meal. 30 tablet 0  . olmesartan (BENICAR) 40 MG tablet Take 1 tablet (40 mg total) by mouth daily. 90 tablet 2  . omeprazole (PRILOSEC) 20 MG capsule Take 1 capsule (20 mg total) by mouth daily. 30 capsule 3  . polyvinyl alcohol (LIQUIFILM TEARS) 1.4 % ophthalmic solution Place 1 drop into both eyes every 4 (four) hours as needed for dry eyes. 15 mL 2  . lactulose (CHRONULAC) 10 GM/15ML solution Take 15 mLs (10 g total) by mouth daily. (Patient not taking: Reported on 02/07/2016) 240 mL 0  . Dexlansoprazole 30 MG capsule Take 1 capsule (30 mg total) by mouth daily. (Patient not taking: Reported on 02/07/2016) 30 capsule 5  . hydrochlorothiazide (HYDRODIURIL) 25 MG tablet Take 1 tablet (25 mg total) by mouth daily. (Patient not taking: Reported on 02/07/2016) 90 tablet 3  . LORazepam (ATIVAN) 0.5 MG tablet Take 1 tablet (0.5 mg total) by mouth every 8 (eight) hours as needed for anxiety or seizure. (Patient not taking: Reported on 02/07/2016) 90 tablet 0  . metFORMIN (GLUCOPHAGE) 500 MG tablet Take 1 tablet (500 mg total) by mouth 2 (two) times daily with a meal. (Patient not taking: Reported on 02/07/2016) 60 tablet 1  . metoprolol succinate (TOPROL-XL) 100 MG 24 hr tablet Take 1 tablet (100 mg total) by mouth daily. Take with or immediately following a meal. (Patient not taking: Reported on 02/07/2016) 90 tablet 3   No facility-administered medications prior to visit.     Allergies:   Lisinopril   Social History   Social History    . Marital Status: Married    Spouse Name: N/A  . Number of Children: N/A  . Years of Education: N/A   Social History Main Topics  . Smoking status: Never Smoker   . Smokeless tobacco: Current User    Types: Snuff  . Alcohol Use: 3.6 oz/week    6 Cans of beer per week  . Drug Use: No  . Sexual Activity: Yes   Other Topics Concern  . None   Social History Narrative     Family History:  No early FHX of CAD   ROS:   Please see the history of present illness.    ROS All other systems reviewed and are negative.   PHYSICAL EXAM:   VS:  BP 137/93 mmHg  Pulse 70  Ht 5\' 10"  (1.778 m)  Wt 244 lb (110.678 kg)  BMI 35.01 kg/m2   GEN: Well nourished, well developed, in no acute distress HEENT: normal Neck: no  JVD, carotid bruits, or masses Cardiac: RRR; no murmurs, rubs, or gallops,no edema  Respiratory:  clear to auscultation bilaterally, normal work of breathing GI: soft, nontender, nondistended, + BS MS: no deformity or atrophy Skin: warm and dry, no rash Neuro:  Alert and Oriented x 3, Strength and sensation are intact Psych: euthymic mood, full affect  Wt Readings from Last 3 Encounters:  02/07/16 244 lb (110.678 kg)  02/01/16 228 lb (103.42 kg)  01/15/16 225 lb 11.2 oz (102.377 kg)      Studies/Labs Reviewed:   EKG:  EKG is not ordered today. Prior EKG 11/07/15 shows sinus rhythm with borderline prolonged QTC of 490 ms, no other changes.    Recent Labs: 11/07/2015: ALT 38; BUN 8; Creatinine, Ser 1.14; Potassium 4.5; Sodium 131* 11/08/2015: Hemoglobin 13.1; Magnesium 1.6*; Platelets 165   Lipid Panel    Component Value Date/Time   CHOL 155 12/29/2013 1425   TRIG 83 12/29/2013 1425   HDL 64 12/29/2013 1425   CHOLHDL 2.4 12/29/2013 1425   VLDL 17 12/29/2013 1425   LDLCALC 74 12/29/2013 1425   LDLDIRECT 89 12/23/2012 1414    Additional studies/ records that were reviewed today include:  Prior office notes, echocardiogram, treadmill test, blood work  reviewed    ASSESSMENT:    1. Syncope, unspecified syncope type   2. Falls, initial encounter   3. Essential hypertension      PLAN:  In order of problems listed above:  Syncope/falls -Some of this falls occur with prodrome of shaking then loss of ability to use his lower extremities and he falls without loss of consciousness. Other times he may actually lose consciousness when on the ground. Does not sound typical for cardiac arrhythmia however I will check a 30 day event monitor to ensure that he does not have any dangerous heart arrhythmias. When he was previously in the hospital, no adverse arrhythmias were seen on telemetry. He has had a prior exercise test which did not show any evidence of ischemia. He has also had an echocardiogram which shows normal ejection fraction. His QTC is borderline prolonged. Thankfully, he is no longer drinking alcohol. Neurology consultation suggested.  Orthostatics performed today do show change from 145/97 pulse of 63 when laying down to 126/85 pulse is 71 when standing, then 138/94 with pulse of 69 when standing for 3 minutes. Borderline abnormal. I do not think however his symptoms are typical for syncope related to orthostatic hypotension.  Medication Adjustments/Labs and Tests Ordered: Current medicines are reviewed at length with the patient today.  Concerns regarding medicines are outlined above.  Medication changes, Labs and Tests ordered today are listed in the Patient Instructions below. Patient Instructions  Medication Instructions:  Your physician recommends that you continue on your current medications as directed. Please refer to the Current Medication list given to you today.   Labwork: NONE  Testing/Procedures: Your physician has recommended that you wear an event monitor. Event monitors are medical devices that record the heart's electrical activity. Doctors most often [name] these monitors to diagnose arrhythmias. Arrhythmias are  problems with the speed or rhythm of the heartbeat. The monitor is a small, portable device. You can wear one while you do your normal daily activities. This is usually used to diagnose what is causing palpitations/syncope (passing out).   Follow-Up: FOLLOW UP AS NEEDED WITH DR. Marlou Porch AT THIS TIME-  Any Other Special Instructions Will Be Listed Below (If Applicable).     If you need a refill on  your cardiac medications before your next appointment, please call your pharmacy.       Bobby Rumpf, MD  02/07/2016 3:30 PM    [address], [place], [place]  [address] Phone: [phone]; Fax: [phone]

## 2016-02-07 NOTE — Patient Instructions (Signed)
Medication Instructions:  Your physician recommends that you continue on your current medications as directed. Please refer to the Current Medication list given to you today.   Labwork: NONE  Testing/Procedures: Your physician has recommended that you wear an event monitor. Event monitors are medical devices that record the heart's electrical activity. Doctors most often [name] these monitors to diagnose arrhythmias. Arrhythmias are problems with the speed or rhythm of the heartbeat. The monitor is a small, portable device. You can wear one while you do your normal daily activities. This is usually used to diagnose what is causing palpitations/syncope (passing out).   Follow-Up: FOLLOW UP AS NEEDED WITH DR. Marlou Porch AT THIS TIME-  Any Other Special Instructions Will Be Listed Below (If Applicable).     If you need a refill on your cardiac medications before your next appointment, please call your pharmacy.

## 2016-02-10 ENCOUNTER — Ambulatory Visit
Admission: RE | Admit: 2016-02-10 | Discharge: 2016-02-10 | Disposition: A | Discharge status: Home / Self Care | Payer: No Typology Code available for payment source | Source: Ambulatory Visit | Attending: Sports Medicine | Admitting: Sports Medicine

## 2016-02-10 DIAGNOSIS — M25561 Pain in right knee: Secondary | ICD-10-CM

## 2016-02-20 ENCOUNTER — Other Ambulatory Visit: Payer: Self-pay | Admitting: *Deleted

## 2016-02-20 ENCOUNTER — Telehealth: Payer: Self-pay | Admitting: Sports Medicine

## 2016-02-20 ENCOUNTER — Ambulatory Visit: Payer: Self-pay | Admitting: Sports Medicine

## 2016-02-20 ENCOUNTER — Encounter: Payer: Self-pay | Admitting: *Deleted

## 2016-02-20 MED ORDER — NAPROXEN 500 MG PO TABS: 500.0000 mg | ORAL_TABLET | Freq: Two times a day (BID) | ORAL | Status: DC

## 2016-02-20 NOTE — Patient Instructions (Signed)
[facility] Dr Erlinda Hong Thursday 03/07/16 at [address], [place], [place] [address] Phone: [phone]

## 2016-02-20 NOTE — Telephone Encounter (Signed)
I spoke with the patient on the phone today after reviewing the MRI of his knee. There is no evidence of a new meniscal tear but he does have diffuse degenerative changes and moderate synovitis. He is status post previous meniscectomy. I discussed treatment options including cortisone injections versus surgical referral. Patient would like to see an orthopedic surgeon. Therefore, I will refer him to [facility] to discuss further treatment. In the meantime, I have agreed to refill his naproxen sodium with instructions to take it twice daily as needed for pain and swelling.

## 2016-02-21 ENCOUNTER — Other Ambulatory Visit: Payer: Self-pay | Admitting: *Deleted

## 2016-02-29 ENCOUNTER — Encounter: Payer: Self-pay | Admitting: *Deleted

## 2016-02-29 NOTE — Progress Notes (Signed)
Patient ID: Jeffrey [name], male   DOB: [birth year], 50 y.o.   MRN: [id number] Patient enrolled with [employer] for a 30 day cardiac event monitor to be mailed to his home pending approval of the [place] hardship program.

## 2016-03-13 ENCOUNTER — Ambulatory Visit (INDEPENDENT_AMBULATORY_CARE_PROVIDER_SITE_OTHER): Payer: No Typology Code available for payment source

## 2016-03-13 DIAGNOSIS — R55 Syncope and collapse: Secondary | ICD-10-CM

## 2016-04-02 ENCOUNTER — Encounter: Payer: Self-pay | Admitting: Family Medicine

## 2016-04-19 ENCOUNTER — Telehealth: Payer: Self-pay | Admitting: Cardiology

## 2016-04-19 NOTE — Telephone Encounter (Signed)
New message   Pt verbalized that he received a vm 04/18/16 stating to call the office but no name or reason was listed

## 2016-04-19 NOTE — Telephone Encounter (Signed)
A message was left for pt 7/12 RE: monitor results.  Results were released to [place] 7/13 for pt's review.  Reviewed results of monitor verbally today.  He states understanding and had no further questions.

## 2016-05-01 ENCOUNTER — Telehealth: Payer: Self-pay

## 2016-05-01 ENCOUNTER — Encounter: Payer: Self-pay | Admitting: Family Medicine

## 2016-05-01 ENCOUNTER — Other Ambulatory Visit: Payer: Self-pay | Admitting: Family Medicine

## 2016-05-01 DIAGNOSIS — I10 Essential (primary) hypertension: Secondary | ICD-10-CM

## 2016-05-01 DIAGNOSIS — E119 Type 2 diabetes mellitus without complications: Secondary | ICD-10-CM

## 2016-05-01 NOTE — Telephone Encounter (Signed)
    form   [name], DO routed conversation to [employer] 3 hours ago (1:28 PM)    [name], DO 3 hours ago (1:26 PM)    Received paperwork from [facility] for this patient.  Requesting recent A1c, CMP and fasting lipid panel.  Please have patient schedule appt for fasting labs and check up and I will fill form out at that time.  Ashly M. Lajuana Ripple, DO PGY-3, [facility]       Documentation

## 2016-05-01 NOTE — Progress Notes (Signed)
Received paperwork from [facility] for this patient.  Requesting recent A1c, CMP and fasting lipid panel.  Please have patient schedule appt for fasting labs and check up and I will fill form out at that time.  Jeffrey [name] M. Lajuana Ripple, DO PGY-3, [facility] [place] [facility]

## 2016-05-01 NOTE — Telephone Encounter (Signed)
Spoke to pt. I made him an appt for Aug 21. That is the first available date for Dr. Lajuana Ripple. He is concerned that he needs to be seen sooner. He needs to follow up on results from cardiologist. Feels like he is out of the loop with his seizers. Should he see someone sooner or will waiting until the 21st of August be ok?  Please advise. Jeffrey [name], CMA

## 2016-05-06 ENCOUNTER — Other Ambulatory Visit: Payer: Self-pay | Admitting: Family Medicine

## 2016-05-06 DIAGNOSIS — E669 Obesity, unspecified: Secondary | ICD-10-CM

## 2016-05-06 DIAGNOSIS — E785 Hyperlipidemia, unspecified: Secondary | ICD-10-CM

## 2016-05-06 NOTE — Telephone Encounter (Signed)
LVM for pt to call the office and make an appt with another Dr if he would like to be sooner. Ottis Stain, CMA

## 2016-05-06 NOTE — Telephone Encounter (Signed)
Yes, he may see another provider if he needs to be seen sooner.  I will place his labs in the system so that he may come in and have done at his convenience.  Please make sure that he is fasting for these labs.

## 2016-05-08 LAB — HM DIABETES EYE EXAM

## 2016-05-14 NOTE — Telephone Encounter (Signed)
Contacted pt and gave him the below information, asked if he wanted to come in earlier and he stated that he would just keep his appointment, I informed him that she had ordered fasting labs for him and that he could just fast that morning, also that if things changed and he felt like he needed to come in sooner that he could call and we would get him in with another doctor. [name], [name], [name]

## 2016-05-27 ENCOUNTER — Encounter: Payer: Self-pay | Admitting: Family Medicine

## 2016-05-27 ENCOUNTER — Ambulatory Visit (INDEPENDENT_AMBULATORY_CARE_PROVIDER_SITE_OTHER): Payer: No Typology Code available for payment source | Admitting: Family Medicine

## 2016-05-27 VITALS — BP 131/86 | HR 63 | Temp 98.2°F | Ht 70.0 in | Wt 218.8 lb

## 2016-05-27 DIAGNOSIS — E669 Obesity, unspecified: Secondary | ICD-10-CM

## 2016-05-27 DIAGNOSIS — E785 Hyperlipidemia, unspecified: Secondary | ICD-10-CM

## 2016-05-27 DIAGNOSIS — I10 Essential (primary) hypertension: Secondary | ICD-10-CM

## 2016-05-27 DIAGNOSIS — E119 Type 2 diabetes mellitus without complications: Secondary | ICD-10-CM

## 2016-05-27 DIAGNOSIS — R569 Unspecified convulsions: Secondary | ICD-10-CM

## 2016-05-27 DIAGNOSIS — F419 Anxiety disorder, unspecified: Secondary | ICD-10-CM

## 2016-05-27 LAB — POCT GLYCOSYLATED HEMOGLOBIN (HGB A1C): HEMOGLOBIN A1C: 11.8

## 2016-05-27 MED ORDER — GABAPENTIN 300 MG PO CAPS: 300.0000 mg | ORAL_CAPSULE | Freq: Three times a day (TID) | ORAL | 3 refills | Status: DC

## 2016-05-27 MED ORDER — FOLIC ACID 1 MG PO TABS: 1.0000 mg | ORAL_TABLET | Freq: Every day | ORAL | 12 refills | Status: DC

## 2016-05-27 MED ORDER — VITAMIN B-1 100 MG PO TABS: 100.0000 mg | ORAL_TABLET | Freq: Every day | ORAL | 12 refills | Status: DC

## 2016-05-27 MED ORDER — METOPROLOL TARTRATE 100 MG PO TABS: 100.0000 mg | ORAL_TABLET | Freq: Two times a day (BID) | ORAL | 3 refills | Status: DC

## 2016-05-27 NOTE — Progress Notes (Signed)
    Subjective: CC: seizure like activity HPI: Jeffrey [name] is a 50 y.o. male presenting to clinic today for office visit. Concerns today include:  1. Seizure like activity He notes that he has had 4 episodes of collapse and shaking.  Episodes last about 30 seconds.  He notes that sometimes he loses consciousness.  He notes that it started last year.  He was hospitalized.  He reports that symptoms stopped last September and started back in January of this year.  Patient has no aura.  He notes a burning feeling from his feet to his head when it happens.  He notes he loses control of his body completely.  No urinary or fecal incontinence during episodes.  Reports that he feels back to normal immediately following episodes.  No longer a drinker.  Last ETOH beverage 11/2015.   2. Anxiety Was prescribed Celexa early this year and patient did not tolerate medication.  He slept well but had vivid dreams on medications so self discontinued.  Notes that he is sleeping poorly and grits teeth.  Social History Reviewed. FamHx and MedHx reviewed.  Please see EMR. Health Maintenance: Colonoscopy, eye exam  ROS: Per HPI  Objective: Office vital signs reviewed. BP 131/86   Pulse 63   Temp 98.2 F (36.8 C) (Oral)   Ht 5\' 10"  (1.778 m)   Wt 218 lb 12.8 oz (99.2 kg)   SpO2 98%   BMI 31.39 kg/m   Physical Examination:  General: Awake, alert, well nourished, No acute distress HEENT: Normal    Neck: No masses palpated. No lymphadenopathy    Ears: Tympanic membranes intact, normal light reflex, no erythema, no bulging    Eyes: PERRLA, EOMI, cataract of Right eye    Nose: nasal turbinates moist    Throat: moist mucus membranes, no erythema Cardio: regular rate and rhythm, S1S2 heard, no murmurs appreciated Pulm: clear to auscultation bilaterally, no wheezes, rhonchi or rales, normal WOB on room air MSK: uses cane for ambulation Neuro: Follows commands, CN 2-12 grossly in tact, except for  slightly decreased hearing on left side, speech normal Psych: mood stable, good eye contact  Assessment/ Plan: 50 y.o. male   1. Seizure-like activity ([place]).  Symptoms somewhat unusual.  Patient seems to lose consciousness intermittently.  No fecal or urinary incontinence.  Apparently may have had an anoxic brain injury at some point 2/2 cardiac arrest.  Also has a h/o ETOH abuse.  Per patient has not had a drink in several months, so do not suspect DTs/ ETOH withdrawal seizures.  Had cardiac monitoring that was WNL and did not reveal an etiology for LOC/ collapse. - Ambulatory referral to Neurology - Thiamine and Folic acid rx'd  2. Essential hypertension, controlled.  - metoprolol (LOPRESSOR) 100 MG tablet; Take 1 tablet (100 mg total) by mouth 2 (two) times daily.  Dispense: 90 tablet; Refill: 3  3. Obesity - COMPLETE METABOLIC PANEL WITH GFR - Lipid panel  4. Hyperlipidemia - COMPLETE METABOLIC PANEL WITH GFR - Lipid panel  5. DM type 2 goal A1C below 7.5 - HgB A1c  6. Anxiety.  Patient did not tolerate Celexa well.  Not amenable to starting a new SSRI today. - Handout with local psych resources provided  Follow up in 3 months or sooner if needed.  Once labs return will plan to fill out paperwork for [facility] and fax to the number provided.   [name], DO PGY-3, [facility]

## 2016-05-27 NOTE — Patient Instructions (Signed)
I have placed a referral for neurology for you.  Someone will call you for an appointment.  I have included a list of local psychiatrists that you may consider seeing.  We also have integrated behavioral care here if you'd like to have counseling in our office.

## 2016-05-28 ENCOUNTER — Other Ambulatory Visit: Payer: Self-pay | Admitting: Family Medicine

## 2016-05-28 DIAGNOSIS — E1169 Type 2 diabetes mellitus with other specified complication: Secondary | ICD-10-CM

## 2016-05-28 DIAGNOSIS — E785 Hyperlipidemia, unspecified: Principal | ICD-10-CM

## 2016-05-28 LAB — COMPLETE METABOLIC PANEL WITH GFR
ALT: 28 U/L (ref 9–46)
AST: 47 U/L — ABNORMAL HIGH (ref 10–35)
Albumin: 4.2 g/dL (ref 3.6–5.1)
Alkaline Phosphatase: 135 U/L — ABNORMAL HIGH (ref 40–115)
BILIRUBIN TOTAL: 0.8 mg/dL (ref 0.2–1.2)
BUN: 17 mg/dL (ref 7–25)
CO2: 15 mmol/L — AB (ref 20–31)
Calcium: 9.7 mg/dL (ref 8.6–10.3)
Chloride: 90 mmol/L — ABNORMAL LOW (ref 98–110)
Creat: 1.41 mg/dL — ABNORMAL HIGH (ref 0.70–1.33)
GFR, EST NON AFRICAN AMERICAN: 57 mL/min — AB (ref >=60)
GFR, Est African American: 66 mL/min (ref >=60)
GLUCOSE: 363 mg/dL — AB (ref 65–99)
POTASSIUM: 5.3 mmol/L (ref 3.5–5.3)
SODIUM: 129 mmol/L — AB (ref 135–146)
TOTAL PROTEIN: 7.4 g/dL (ref 6.1–8.1)

## 2016-05-28 LAB — LIPID PANEL
CHOL/HDL RATIO: 4.3 ratio (ref <=5.0)
Cholesterol: 193 mg/dL (ref 125–200)
HDL: 45 mg/dL (ref >=40)
LDL CALC: 90 mg/dL (ref <130)
Triglycerides: 290 mg/dL — ABNORMAL HIGH (ref <150)
VLDL: 58 mg/dL — AB (ref <30)

## 2016-05-28 MED ORDER — ATORVASTATIN CALCIUM 80 MG PO TABS: 80.0000 mg | ORAL_TABLET | Freq: Every day | ORAL | 3 refills | Status: DC

## 2016-05-28 MED ORDER — ATORVASTATIN CALCIUM 80 MG PO TABS
80.0000 mg | ORAL_TABLET | Freq: Every day | ORAL | 3 refills | Status: DC
Start: 2016-05-28 — End: 2016-05-28

## 2016-05-28 NOTE — Progress Notes (Signed)
ASCVD risk 17.8%.  Patient needs to be on a statin, esp given h/o cardiac arrest.  Lipitor 80mg  tablets have been ordered.  Will print and place Rx up front for patient to pick up, as he uses [facility] for medications.  Patient needs to schedule an appointment for his uncontrolled DM.  Please verify that patient is using his insulin.  Ardith Lewman M. Lajuana Ripple, DO PGY-3, [facility]

## 2016-05-28 NOTE — Progress Notes (Signed)
Spoke to pt, appt made for 8/31/ @ 2:45pm. Jeffrey [name], [place]

## 2016-06-03 ENCOUNTER — Ambulatory Visit: Payer: No Typology Code available for payment source | Admitting: Family Medicine

## 2016-06-03 IMAGING — CR DG KNEE AP/LAT W/ SUNRISE*R*
1 series · 1 of 1 positions shown · non-contrast
Comparison: None.

CLINICAL DATA: Chronic right knee pain and swelling without known
injury.

EXAM:
RIGHT KNEE 3 VIEWS

[view not recorded]
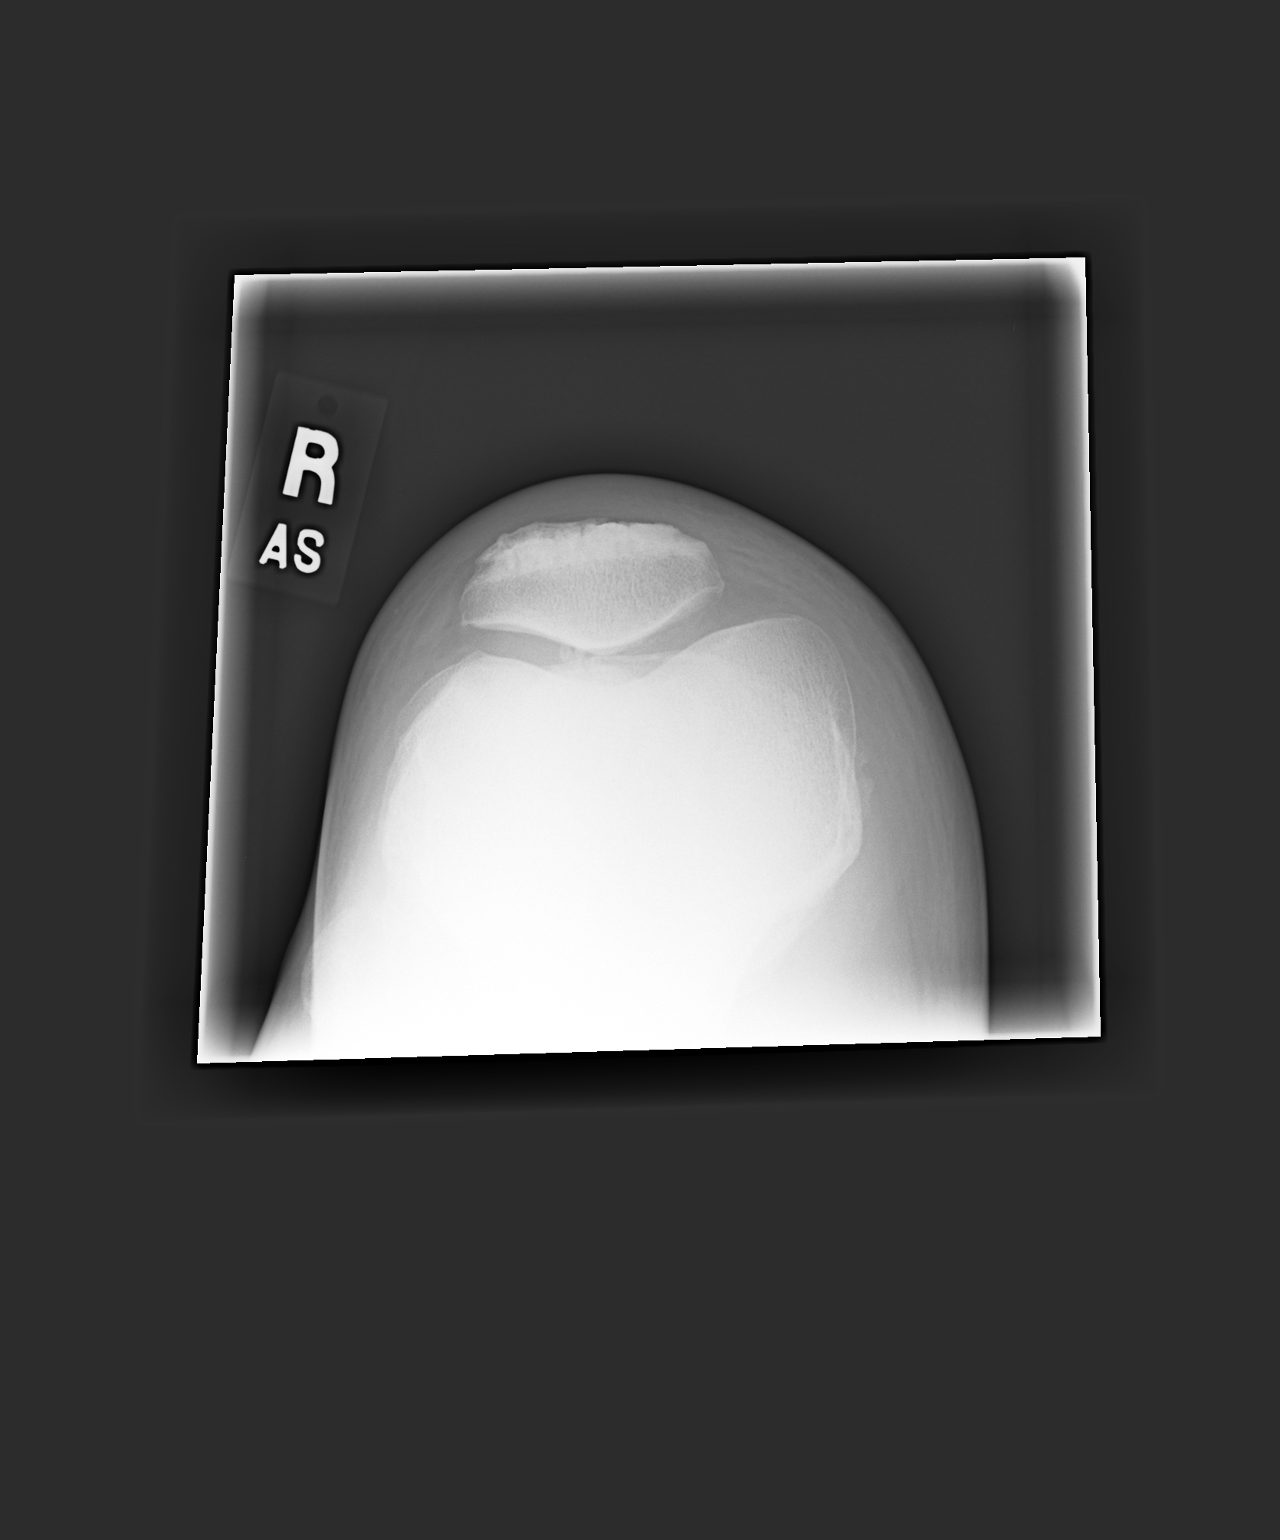

[1 of 1 positions shown; findings below may reference images not displayed]

FINDINGS: There is no evidence of fracture, dislocation, or joint effusion.
Mild narrowing of medial joint space is noted. Spurring is noted
involving superior aspect of patella, with osteophyte formation seen
medially. Soft tissues are unremarkable.
IMPRESSION: Mild degenerative joint disease is noted medially. No acute
abnormality seen in the right knee.

## 2016-06-06 ENCOUNTER — Ambulatory Visit: Payer: No Typology Code available for payment source | Admitting: Family Medicine

## 2016-06-19 ENCOUNTER — Encounter: Payer: Self-pay | Admitting: Family Medicine

## 2016-06-19 ENCOUNTER — Ambulatory Visit: Payer: Self-pay | Attending: Internal Medicine

## 2016-06-19 ENCOUNTER — Ambulatory Visit (INDEPENDENT_AMBULATORY_CARE_PROVIDER_SITE_OTHER): Payer: Self-pay | Admitting: Family Medicine

## 2016-06-19 VITALS — BP 149/97 | HR 81 | Temp 98.1°F | Wt 228.0 lb

## 2016-06-19 DIAGNOSIS — N179 Acute kidney failure, unspecified: Secondary | ICD-10-CM

## 2016-06-19 DIAGNOSIS — B9789 Other viral agents as the cause of diseases classified elsewhere: Secondary | ICD-10-CM

## 2016-06-19 DIAGNOSIS — K7030 Alcoholic cirrhosis of liver without ascites: Secondary | ICD-10-CM

## 2016-06-19 DIAGNOSIS — E119 Type 2 diabetes mellitus without complications: Secondary | ICD-10-CM

## 2016-06-19 DIAGNOSIS — Z23 Encounter for immunization: Secondary | ICD-10-CM

## 2016-06-19 DIAGNOSIS — J069 Acute upper respiratory infection, unspecified: Secondary | ICD-10-CM

## 2016-06-19 LAB — BASIC METABOLIC PANEL WITH GFR
BUN: 8 mg/dL (ref 7–25)
CHLORIDE: 103 mmol/L (ref 98–110)
CO2: 21 mmol/L (ref 20–31)
Calcium: 9 mg/dL (ref 8.6–10.3)
Creat: 1 mg/dL (ref 0.70–1.33)
GFR, EST NON AFRICAN AMERICAN: 86 mL/min (ref >=60)
GFR, Est African American: >89 mL/min (ref >=60)
Glucose, Bld: 445 mg/dL — ABNORMAL HIGH (ref 65–99)
POTASSIUM: 4.2 mmol/L (ref 3.5–5.3)
SODIUM: 134 mmol/L — AB (ref 135–146)

## 2016-06-19 MED ORDER — LACTULOSE 10 GM/15ML PO SOLN: 10.0000 g | Freq: Every day | ORAL | 5 refills | Status: DC

## 2016-06-19 MED ORDER — POLYVINYL ALCOHOL 1.4 % OP SOLN: 1.0000 [drp] | OPHTHALMIC | 2 refills | Status: DC | PRN

## 2016-06-19 NOTE — Progress Notes (Signed)
    Subjective: CC: uncontrolled DM2 HPI: Jeffrey [name] is a 50 y.o. male presenting to clinic today for follow up. Concerns today include:  1. Diabetes:  High at home: 180 Low at home: 15 Taking medications: Lantus 15u qam.  Reports that he was out of medication for almost 3 weeks before he saw me last visit for A1c.  He reports that he was not able to get his Benicar through [facility]. ROS: denies fever, chills, dizziness, LOC, polyuria, polydipsia, numbness or tingling in extremities or chest pain. Last eye exam: 05/08/16, no DM retinopathy Last foot exam: 07/2015 Last A1c: 11.8 (05/27/2016) Nephropathy screen indicated?: no on ARB Last flu, zoster and/or pneumovax: Flu vaccine due  2. Hypertension Blood pressure today: 149/97 Meds: Compliant with Metoprolol.  Doesn't have Benicar ROS: Denies headache, dizziness, visual changes, nausea, vomiting, chest pain, abdominal pain or shortness of breath.  3. URI Patient reports that Labor day, he lost his voice.  He was having myalgias and fatigue.  He reports that he stayed in bed all week.  He reports congestion and cough.  Has been using Alka seltzer plus.  Not taking Tylenol/ Motrin.  Denies fevers.  Reports occ sweating with hypoglycemia.  Denies sick contacts, recent travel.  BM are stable from previous.  Social History Reviewed: active smoker. FamHx and MedHx reviewed.  Please see EMR. Health Maintenance: flu shot  ROS: Per HPI  Objective: Office vital signs reviewed. BP (!) 149/97   Pulse 81   Temp 98.1 F (36.7 C) (Oral)   Wt 228 lb (103.4 kg)   SpO2 99%   BMI 32.71 kg/m   Physical Examination:  General: Awake, alert, well nourished, No acute distress HEENT: Normal    Neck: No masses palpated. No lymphadenopathy    Ears: Tympanic membranes intact, normal light reflex, no erythema, no bulging    Eyes: PERRLA, EOMI    Nose: nasal turbinates moist, dried rhinorrhea externally    Throat: moist mucus membranes, no  erythema, nicotine stained tongue, no tonsillar exudate Cardio: regular rate and rhythm, S1S2 heard Pulm: normal WOB on room air MSK: Normal gait and station  Assessment/ Plan: 50 y.o. male   1. DM type 2 goal A1C below 7.5.  Last A1c 11.8.  Considered adjusting insulin regimen but patient had been off of insulin for 3 weeks preceding lab. - Continue Lantus 15u daily  - Repeat A1c in 2 months - Continue monitoring blood sugar daily - Needs to restart ARB for renal protection  2. AKI (acute kidney injury) ([place]).  Cr 1.41 last check.  Has been off ARB - BASIC METABOLIC PANEL WITH GFR - Will also plan to repeat about 1 week after restarting Benicar.  3. Alcoholic cirrhosis of liver without ascites (HCC) - lactulose (CHRONULAC) 10 GM/15ML solution; Take 15 mLs (10 g total) by mouth daily.  Dispense: 240 mL; Refill: 5  4. Encounter for immunization - Flu Vaccine QUAD 36+ mos IM  5. Viral URI with cough - Supportive care - If no improvement by 2 weeks, consider empiric abx  Follow up in November for DM2.   [name], DO PGY-3, [facility]

## 2016-06-20 ENCOUNTER — Telehealth: Payer: Self-pay | Admitting: *Deleted

## 2016-06-20 NOTE — Telephone Encounter (Signed)
[facility] lab calling with critical value for glucose - 445 (repeated and verified).  Paged and informed Dr. Lajuana Ripple.  Burna Forts, BSN, RN-BC  Per Dr. Lajuana Ripple - Patient has been out of insulin x 3 weeks.  Was seen in office yesterday and supposed to contact [facility] for insulin.  Have patient check CBG this morning.    Called patient and he has not checked CBG yet.  He also did not go to [facility] yesterday due to transportation issues, but is planning to go today.  Pt will check CBG and I will call him  back in 15 minutes.  9:25 am - CBG 182 (had breakfast at 7 am).  Note routed to Dr. Lajuana Ripple.  Burna Forts, BSN, RN-BC

## 2016-06-20 NOTE — Telephone Encounter (Signed)
Thank you for contacting patient.

## 2016-07-01 ENCOUNTER — Ambulatory Visit: Payer: Managed Care, Other (non HMO) | Admitting: Neurology

## 2016-07-16 ENCOUNTER — Ambulatory Visit: Payer: Managed Care, Other (non HMO) | Admitting: Neurology

## 2016-07-24 ENCOUNTER — Telehealth: Payer: Self-pay | Admitting: Neurology

## 2016-07-24 NOTE — Telephone Encounter (Signed)
PT CALLED ASKING HOW TO GET HIS [employer] APPROVAL LETTER. INFORMED PT TO CONTACT PERSON / DEPT THAT HE APPLIED WITH TO REQ COPY OF HIS LETTER. PT SAYS MAY HAVE TO R/S APPT 10/24 IF HE CANNOT GET IT. HE ALREADY R/S ONE TIME BEFORE DUE TO WAITING FOR APPROVAL LETTER.  ADVISED PT TO CONTACT [facility] FOR APPROVAL LETTER.  CB

## 2016-07-30 ENCOUNTER — Ambulatory Visit (INDEPENDENT_AMBULATORY_CARE_PROVIDER_SITE_OTHER): Payer: Self-pay | Admitting: Neurology

## 2016-07-30 ENCOUNTER — Encounter: Payer: Self-pay | Admitting: Neurology

## 2016-07-30 VITALS — BP 205/100 | HR 69 | Ht 70.0 in | Wt 230.0 lb

## 2016-07-30 DIAGNOSIS — Z9989 Dependence on other enabling machines and devices: Secondary | ICD-10-CM

## 2016-07-30 DIAGNOSIS — IMO0002 Reserved for concepts with insufficient information to code with codable children: Secondary | ICD-10-CM

## 2016-07-30 DIAGNOSIS — G4733 Obstructive sleep apnea (adult) (pediatric): Secondary | ICD-10-CM

## 2016-07-30 DIAGNOSIS — I469 Cardiac arrest, cause unspecified: Secondary | ICD-10-CM

## 2016-07-30 DIAGNOSIS — R55 Syncope and collapse: Secondary | ICD-10-CM

## 2016-07-30 MED ORDER — DIVALPROEX SODIUM ER 500 MG PO TB24: 500.0000 mg | ORAL_TABLET | Freq: Every day | ORAL | 11 refills | Status: DC

## 2016-07-30 NOTE — Progress Notes (Signed)
PATIENT: Jeffrey [name] DOB: [birth year]  Chief Complaint  Patient presents with  . Seizure-like activity    Reports having his first seizure-like event in May 2016 and since this time he has experienced 3-4 passing out events each month.  States that he sometimes has hot flashes or extremity shaking prior to these blackouts.    . PCP    [name], DO     HISTORICAL  Jeffrey [name] is a 50 years old right-handed male, seen in refer by his primary care physician Dr. [name], for evaluation of passing out episode, initial evaluation was July 30 2016.  I have reviewed and summarized multiple previous hospital admission notes, he had past medical history of cardiac arrest in March 2014, PEA cardiac arrest at home, require hypothermia protocol, pressor, sedation, intubation, patient has ventilatory dependent respiratory failure secondary to respiratory arrest which led to cardiac arrest, respiratory arrest likely due to obstructive sleep apnea and not complying with the CPAP at home, additionally, he was found to have pulmonary infiltration, require antibiotic treatment, history of GI bleeding with anemia, EGD confirmed ulcer. Type 2 diabetes, insulin-dependent, poorly controlled, A1c was 9.5.  He had later hospital admission for alcohol detoxification, recurrent hypoglycemia episode, glucose was in 30s.  He is now on disability, he used to work as Engineer, agricultural, work uncomplicated machines, he is not driving, came in to clinic by transportation,  He began to have frequent passing out episodes since May 2016, 2-3 times each week, usually started by feeling warm in his feet, ascending sensation throughout his body, transient loss of consciousness, this is different from his typical hypoglycemia episode, where he usually feel hungry, nervousness, he checks his glucose 3-4 times each day  I reviewed laboratory evaluation in September 2017, glucose was  445, sodium 134, creatinine 1.0, A1c August was 11 point 8, fasting lipid profile, triglycerides 290, LDL 90, CBC showed hemoglobin of 13.1,  His passing out episode can happen in sit down, or standing position, no chest pain, no heart palpitation, no tongue biting, no bowel and bladder incontinence,  I reviewed cardiac monitoring July 2017 there was no significant cardiac arrhythmia notice   REVIEW OF SYSTEMS: Full 14 system review of systems performed and notable only for chronic insomnia, sleepiness, snoring, memory loss, confusion, slurred speech, seizure, passing out, depression, anxiety, not enough sleep, decreased energy, change in appetite, suicidal thoughts, racing thoughts, increased thirst, joints pain, joint swelling, achy muscles, runny nose, blurry vision, loss of vision, cough, snoring, diarrhea, impotence, weight loss, swelling, rash,   ALLERGIES: Allergies  Allergen Reactions  . Lisinopril Other (See Comments)    Pt reports nose bleed.    HOME MEDICATIONS: Current Outpatient Prescriptions  Medication Sig Dispense Refill  . aspirin EC 81 MG tablet Take 81 mg by mouth at bedtime.     [name] atorvastatin (LIPITOR) 80 MG tablet Take 1 tablet (80 mg total) by mouth daily. 90 tablet 3  . folic acid (FOLVITE) 1 MG tablet Take 1 tablet (1 mg total) by mouth daily. 30 tablet 12  . gabapentin (NEURONTIN) 300 MG capsule Take 1 capsule (300 mg total) by mouth 3 (three) times daily. 90 capsule 3  . glucose blood test strip Use as instructed 100 each 12  . Insulin Glargine (LANTUS) 100 UNIT/ML Solostar Pen Inject 15 Units into the skin every morning. (Patient taking differently: Inject 20 Units into the skin every morning. ) 15 mL 11  . Insulin  Pen Needle 31G X 8 MM MISC BD UltraFine III Pen Needles. For use with insulin pen device. Inject insulin 6 x daily 200 each 3  . Insulin Syringes, Disposable, U-100 0.3 ML MISC Use one to inject insulin twice daily 100 each 0  . lactulose (CHRONULAC)  10 GM/15ML solution Take 15 mLs (10 g total) by mouth daily. 240 mL 5  . metoprolol (LOPRESSOR) 100 MG tablet Take 1 tablet (100 mg total) by mouth 2 (two) times daily. 90 tablet 3  . Multiple Vitamin (MULTIVITAMIN WITH MINERALS) TABS tablet Take 1 tablet by mouth daily.    . naproxen (NAPROSYN) 500 MG tablet Take 1 tablet (500 mg total) by mouth 2 (two) times daily with a meal. (Patient taking differently: Take 250 mg by mouth daily. ) 30 tablet 1  . olmesartan (BENICAR) 40 MG tablet Take 1 tablet (40 mg total) by mouth daily. 90 tablet 2  . omeprazole (PRILOSEC) 20 MG capsule Take 1 capsule (20 mg total) by mouth daily. 30 capsule 3  . polyvinyl alcohol (LIQUIFILM TEARS) 1.4 % ophthalmic solution Place 1 drop into both eyes every 4 (four) hours as needed for dry eyes. 15 mL 2  . thiamine (VITAMIN B-1) 100 MG tablet Take 1 tablet (100 mg total) by mouth daily. 30 tablet 12   No current facility-administered medications for this visit.     PAST MEDICAL HISTORY: Past Medical History:  Diagnosis Date  . Acute renal insufficiency 12/08/2012  . AKI (acute kidney injury) ([place])   . ALCOHOL ABUSE, HX OF 11/10/2007  . Anoxic encephalopathy ([place])   . Anxiety   . Bleeding ulcer 2014  . Blood transfusion 2014   "related to bleeding ulcer"  . Cardiac arrest ([place]) 12/07/2012   Anoxic encephalopathy  . GERD (gastroesophageal reflux disease)   . High cholesterol   . Hypertension   . OSA on CPAP 12/07/2012  . Seizure-like activity ([place])   . Type II diabetes mellitus ([place])   . Venous insufficiency 12/13/2011    PAST SURGICAL HISTORY: Past Surgical History:  Procedure Laterality Date  . CARDIOVASCULAR STRESS TEST  03/16/13   Very Poor Exercise Tolerance; NON DIAGNOSTIC TEST  . CATARACT EXTRACTION W/ INTRAOCULAR LENS IMPLANT Left 03/07/2014   [place] @ [facility]  . ESOPHAGOGASTRODUODENOSCOPY Left 11/26/2012   Procedure: ESOPHAGOGASTRODUODENOSCOPY (EGD);  Surgeon: Wonda Horner, MD;  Location: [facility]  ENDOSCOPY;  Service: Endoscopy;  Laterality: Left;  . ESOPHAGOGASTRODUODENOSCOPY N/A 10/10/2014   Procedure: ESOPHAGOGASTRODUODENOSCOPY (EGD);  Surgeon: Ladene Artist, MD;  Location: [facility] ENDOSCOPY;  Service: Endoscopy;  Laterality: N/A;  . KNEE ARTHROSCOPY Left ~ 1999    FAMILY HISTORY: Family History  Problem Relation Age of Onset  . Other Mother     Unsure of medical history.  . Other Father     Unsure of medical history.    SOCIAL HISTORY:  Social History   Social History  . Marital status: Married    Spouse name: N/A  . Number of children: 0  . Years of education: Bachelors   Occupational History  . Unemployed    Social History Main Topics  . Smoking status: Never Smoker  . Smokeless tobacco: Current User    Types: Snuff  . Alcohol use 3.6 oz/week    6 Cans of beer per week  . Drug use: No  . Sexual activity: Yes   Other Topics Concern  . Not on file   Social History Narrative   Lives at home  with wife.   Right-handed.   Occasional caffeine use.     PHYSICAL EXAM   Vitals:   07/30/16 1330  BP: (!) 205/100  Pulse: 69  Weight: 230 lb (104.3 kg)  Height: 5\' 10"  (1.778 m)    Not recorded      Body mass index is 33 kg/m.  PHYSICAL EXAMNIATION:  Gen: NAD, conversant, well nourised, obese, well groomed                     Cardiovascular: Regular rate rhythm, no peripheral edema, warm, nontender. Eyes: Conjunctivae clear without exudates or hemorrhage Neck: Supple, no carotid bruits. Pulmonary: Clear to auscultation bilaterally   NEUROLOGICAL EXAM:  MENTAL STATUS: Speech:    Speech is normal; fluent and spontaneous with normal comprehension.  Cognition:     Orientation to time, place and person     Normal recent and remote memory     Normal Attention span and concentration     Normal Language, naming, repeating,spontaneous speech     Fund of knowledge   CRANIAL NERVES: CN II: Visual fields are full to confrontation. Fundoscopic exam is  normal with sharp discs and no vascular changes. Pupils are round equal and briskly reactive to light. CN III, IV, VI: extraocular movement are normal. No ptosis. CN V: Facial sensation is intact to pinprick in all 3 divisions bilaterally. Corneal responses are intact.  CN VII: Face is symmetric with normal eye closure and smile. CN VIII: Hearing is normal to rubbing fingers CN IX, X: Palate elevates symmetrically. Phonation is normal. CN XI: Head turning and shoulder shrug are intact CN XII: Tongue is midline with normal movements and no atrophy.  MOTOR: There is no pronator drift of out-stretched arms. Muscle bulk and tone are normal. Muscle strength is normal.  REFLEXES: Reflexes are 2+ and symmetric at the biceps, triceps, knees, and ankles. Plantar responses are flexor.  SENSORY: Intact to light touch, pinprick, positional sensation and vibratory sensation are intact in fingers and toes.  COORDINATION: Rapid alternating movements and fine finger movements are intact. There is no dysmetria on finger-to-nose and heel-knee-shin.    GAIT/STANCE: Posture is normal. Gait is steady with normal steps, base, arm swing, and turning. Heel and toe walking are normal. Tandem gait is normal.  Romberg is absent.   DIAGNOSTIC DATA (LABS, IMAGING, TESTING) - I reviewed patient records, labs, notes, testing and imaging myself where available.   ASSESSMENT AND PLAN  UMER HARIG is a 50 y.o. 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  Marcial Pacas, M.D. Ph.D.  [facility] [place], [place] [address] Phone: [phone] Fax:      [phone]      Marcial Pacas, M.D. Ph.D.  [facility] [address], [place],  [address] Ph: [phone] Fax: [phone]  CC: [name], DO

## 2016-08-13 ENCOUNTER — Ambulatory Visit (INDEPENDENT_AMBULATORY_CARE_PROVIDER_SITE_OTHER): Payer: Self-pay | Admitting: Neurology

## 2016-08-13 DIAGNOSIS — I469 Cardiac arrest, cause unspecified: Secondary | ICD-10-CM

## 2016-08-13 DIAGNOSIS — R55 Syncope and collapse: Secondary | ICD-10-CM

## 2016-08-13 DIAGNOSIS — IMO0002 Reserved for concepts with insufficient information to code with codable children: Secondary | ICD-10-CM

## 2016-08-13 DIAGNOSIS — Z9989 Dependence on other enabling machines and devices: Secondary | ICD-10-CM

## 2016-08-13 DIAGNOSIS — G4733 Obstructive sleep apnea (adult) (pediatric): Secondary | ICD-10-CM

## 2016-08-21 NOTE — Procedures (Signed)
   HISTORY: 50 years old male, presented with passing out episode  TECHNIQUE:  16 channel EEG was performed based on standard 10-16 international system. One channel was dedicated to EKG, which has demonstrates normal sinus rhythm of 60 beats per minutes.  Upon awakening, the posterior background activity was well-developed, in alpha range 8 Hz, reactive to eye opening and closure.  There was no evidence of epileptiform discharge.  Photic stimulation was performed, which induced a symmetric photic driving.  Hyperventilation was performed, there was no abnormality elicit.  No sleep was achieved.  CONCLUSION: This is a  normal awake EEG.  There is no electrodiagnostic evidence of epileptiform discharge.  Marcial Pacas, M.D. Ph.D.  [facility] [place], [place] [address] Phone: [phone] Fax:      [phone]

## 2016-08-27 ENCOUNTER — Ambulatory Visit (HOSPITAL_COMMUNITY)
Admission: RE | Admit: 2016-08-27 | Discharge: 2016-08-27 | Disposition: A | Discharge status: Home / Self Care | Payer: Self-pay | Source: Ambulatory Visit | Attending: Neurology | Admitting: Neurology

## 2016-08-27 DIAGNOSIS — Z9989 Dependence on other enabling machines and devices: Secondary | ICD-10-CM

## 2016-08-27 DIAGNOSIS — I469 Cardiac arrest, cause unspecified: Secondary | ICD-10-CM

## 2016-08-27 DIAGNOSIS — R55 Syncope and collapse: Secondary | ICD-10-CM | POA: Insufficient documentation

## 2016-08-27 DIAGNOSIS — IMO0002 Reserved for concepts with insufficient information to code with codable children: Secondary | ICD-10-CM

## 2016-08-27 DIAGNOSIS — G319 Degenerative disease of nervous system, unspecified: Secondary | ICD-10-CM | POA: Insufficient documentation

## 2016-08-27 DIAGNOSIS — G4733 Obstructive sleep apnea (adult) (pediatric): Secondary | ICD-10-CM | POA: Insufficient documentation

## 2016-08-27 LAB — POCT I-STAT CREATININE: CREATININE: 1.1 mg/dL (ref 0.61–1.24)

## 2016-08-27 MED ORDER — GADOBENATE DIMEGLUMINE 529 MG/ML IV SOLN
20.0000 mL | Freq: Once | INTRAVENOUS | Status: AC | PRN
  Administered 2016-08-27: 20 mL via INTRAVENOUS

## 2016-09-09 ENCOUNTER — Encounter: Payer: Self-pay | Admitting: Neurology

## 2016-09-09 ENCOUNTER — Ambulatory Visit (INDEPENDENT_AMBULATORY_CARE_PROVIDER_SITE_OTHER): Payer: Self-pay | Admitting: Neurology

## 2016-09-09 VITALS — BP 182/97 | HR 69 | Ht 70.0 in | Wt 235.0 lb

## 2016-09-09 DIAGNOSIS — I469 Cardiac arrest, cause unspecified: Secondary | ICD-10-CM

## 2016-09-09 DIAGNOSIS — R55 Syncope and collapse: Secondary | ICD-10-CM

## 2016-09-09 DIAGNOSIS — IMO0002 Reserved for concepts with insufficient information to code with codable children: Secondary | ICD-10-CM

## 2016-09-09 MED ORDER — LEVETIRACETAM 750 MG PO TABS: 750.0000 mg | ORAL_TABLET | Freq: Two times a day (BID) | ORAL | 11 refills | Status: DC

## 2016-09-09 NOTE — Progress Notes (Signed)
PATIENT: Jeffrey [name] DOB: Nov 08, 1965  Chief Complaint  Patient presents with  . Passed Out    He would like to review his MRI and EEG results.  He is currently taking Depakote ER 500mg , one tab at bedtime.  Reports having six additional events, despite taking his medication as prescribed.     HISTORICAL  [name] is a 50 years old right-handed male, seen in refer by his primary care physician Dr. [name], for evaluation of passing out episode, initial evaluation was July 30 2016.  I have reviewed and summarized multiple previous hospital admission notes, he had past medical history of cardiac arrest in March 2014, PEA cardiac arrest at home, require hypothermia protocol, pressor, sedation, intubation, patient has ventilatory dependent respiratory failure secondary to respiratory arrest which led to cardiac arrest, respiratory arrest likely due to obstructive sleep apnea and not complying with the CPAP at home, additionally, he was found to have pulmonary infiltration, require antibiotic treatment, history of GI bleeding with anemia, EGD confirmed ulcer. Type 2 diabetes, insulin-dependent, poorly controlled, A1c was 9.5.  He had later hospital admission for alcohol detoxification, recurrent hypoglycemia episode, glucose was in 30s.  He is now on disability, he used to work as Engineer, agricultural, work uncomplicated machines, he is not driving, came in to clinic by transportation,  He began to have frequent passing out episodes since May 2016, 2-3 times each week, usually started by feeling warm in his feet, ascending sensation throughout his body, transient loss of consciousness, this is different from his typical hypoglycemia episode, where he usually feel hungry, nervousness, he checks his glucose 3-4 times each day  I reviewed laboratory evaluation in September 2017, glucose was 445, sodium 134, creatinine 1.0, A1c August was 11 point 8, fasting  lipid profile, triglycerides 290, LDL 90, CBC showed hemoglobin of 13.1,  His passing out episode can happen in sit down, or standing position, no chest pain, no heart palpitation, no tongue biting, no bowel and bladder incontinence,  I reviewed cardiac monitoring July 2017 there was no significant cardiac arrhythmia notice  UPDATE Dec 4th 2017: EEG was normal July 30 2016, We have personally reviewed MRI of the brain in November 2017, mild generalized atrophy no acute abnormality  He is now taking Depakote Er 500mg  qhs, he reported mild improvement, he still has spells, he counted 6 in past few weeks, instead of almost daily basis in the past, he complains of nightmare and chills taking depakote.  He also noticed slow thought process.  He had 3-4 spells during cardiac monitoring, but no significant arrhythmia found.     REVIEW OF SYSTEMS: Full 14 system review of systems performed and notable only for loss of vision, hearing loss, leg swelling, insomnia, Apnea, snoring, frequent urination, joint pain, swelling, walking difficulty, rash, memory loss, seizure, tremor, passed out, behavior problem, confusion, anxiety  ALLERGIES: Allergies  Allergen Reactions  . Lisinopril Other (See Comments)    Pt reports nose bleed.    HOME MEDICATIONS: Current Outpatient Prescriptions  Medication Sig Dispense Refill  . aspirin EC 81 MG tablet Take 81 mg by mouth at bedtime.     [name] atorvastatin (LIPITOR) 80 MG tablet Take 1 tablet (80 mg total) by mouth daily. 90 tablet 3  . divalproex (DEPAKOTE ER) 500 MG 24 hr tablet Take 1 tablet (500 mg total) by mouth at bedtime. 30 tablet 11  . folic acid (FOLVITE) 1 MG tablet Take 1 tablet (1 mg  total) by mouth daily. 30 tablet 12  . gabapentin (NEURONTIN) 300 MG capsule Take 1 capsule (300 mg total) by mouth 3 (three) times daily. 90 capsule 3  . glucose blood test strip Use as instructed 100 each 12  . Insulin Glargine (LANTUS) 100 UNIT/ML Solostar Pen  Inject 15 Units into the skin every morning. (Patient taking differently: Inject 20 Units into the skin every morning. ) 15 mL 11  . Insulin Pen Needle 31G X 8 MM MISC BD UltraFine III Pen Needles. For use with insulin pen device. Inject insulin 6 x daily 200 each 3  . Insulin Syringes, Disposable, U-100 0.3 ML MISC Use one to inject insulin twice daily 100 each 0  . lactulose (CHRONULAC) 10 GM/15ML solution Take 15 mLs (10 g total) by mouth daily. 240 mL 5  . metoprolol (LOPRESSOR) 100 MG tablet Take 1 tablet (100 mg total) by mouth 2 (two) times daily. 90 tablet 3  . Multiple Vitamin (MULTIVITAMIN WITH MINERALS) TABS tablet Take 1 tablet by mouth daily.    . naproxen (NAPROSYN) 500 MG tablet Take 1 tablet (500 mg total) by mouth 2 (two) times daily with a meal. (Patient taking differently: Take 250 mg by mouth daily. ) 30 tablet 1  . olmesartan (BENICAR) 40 MG tablet Take 1 tablet (40 mg total) by mouth daily. 90 tablet 2  . omeprazole (PRILOSEC) 20 MG capsule Take 1 capsule (20 mg total) by mouth daily. 30 capsule 3  . polyvinyl alcohol (LIQUIFILM TEARS) 1.4 % ophthalmic solution Place 1 drop into both eyes every 4 (four) hours as needed for dry eyes. 15 mL 2  . thiamine (VITAMIN B-1) 100 MG tablet Take 1 tablet (100 mg total) by mouth daily. 30 tablet 12   No current facility-administered medications for this visit.     PAST MEDICAL HISTORY: Past Medical History:  Diagnosis Date  . Acute renal insufficiency 12/08/2012  . AKI (acute kidney injury) ([place])   . ALCOHOL ABUSE, HX OF 11/10/2007  . Anoxic encephalopathy ([place])   . Anxiety   . Bleeding ulcer 2014  . Blood transfusion 2014   "related to bleeding ulcer"  . Cardiac arrest ([place]) 12/07/2012   Anoxic encephalopathy  . GERD (gastroesophageal reflux disease)   . High cholesterol   . Hypertension   . OSA on CPAP 12/07/2012  . Seizure-like activity ([place])   . Type II diabetes mellitus ([place])   . Venous insufficiency 12/13/2011    PAST  SURGICAL HISTORY: Past Surgical History:  Procedure Laterality Date  . CARDIOVASCULAR STRESS TEST  03/16/13   Very Poor Exercise Tolerance; NON DIAGNOSTIC TEST  . CATARACT EXTRACTION W/ INTRAOCULAR LENS IMPLANT Left 03/07/2014   [place] @ [facility]  . ESOPHAGOGASTRODUODENOSCOPY Left 11/26/2012   Procedure: ESOPHAGOGASTRODUODENOSCOPY (EGD);  Surgeon: Wonda Horner, MD;  Location: [facility] ENDOSCOPY;  Service: Endoscopy;  Laterality: Left;  . ESOPHAGOGASTRODUODENOSCOPY N/A 10/10/2014   Procedure: ESOPHAGOGASTRODUODENOSCOPY (EGD);  Surgeon: Ladene Artist, MD;  Location: [facility] ENDOSCOPY;  Service: Endoscopy;  Laterality: N/A;  . KNEE ARTHROSCOPY Left ~ 1999    FAMILY HISTORY: Family History  Problem Relation Age of Onset  . Other Mother     Unsure of medical history.  . Other Father     Unsure of medical history.    SOCIAL HISTORY:  Social History   Social History  . Marital status: Married    Spouse name: N/A  . Number of children: 0  . Years of education: Buyer, retail  Occupational History  . Unemployed    Social History Main Topics  . Smoking status: Never Smoker  . Smokeless tobacco: Current User    Types: Snuff  . Alcohol use 3.6 oz/week    6 Cans of beer per week  . Drug use: No  . Sexual activity: Yes   Other Topics Concern  . Not on file   Social History Narrative   Lives at home with wife.   Right-handed.   Occasional caffeine use.     PHYSICAL EXAM   Vitals:   09/09/16 1208  BP: (!) 182/97  Pulse: 69  Weight: 235 lb (106.6 kg)  Height: 5\' 10"  (1.778 m)    Not recorded      Body mass index is 33.72 kg/m.  PHYSICAL EXAMNIATION:  Gen: NAD, conversant, well nourised, obese, well groomed                     Cardiovascular: Regular rate rhythm, no peripheral edema, warm, nontender. Eyes: Conjunctivae clear without exudates or hemorrhage Neck: Supple, no carotid bruits. Pulmonary: Clear to auscultation bilaterally   NEUROLOGICAL  EXAM:  MENTAL STATUS: Speech:    Speech is normal; fluent and spontaneous with normal comprehension.  Cognition:     Orientation to time, place and person     Normal recent and remote memory     Normal Attention span and concentration     Normal Language, naming, repeating,spontaneous speech     Fund of knowledge   CRANIAL NERVES: CN II: Visual fields are full to confrontation. Fundoscopic exam is normal with sharp discs and no vascular changes. Pupils are round equal and briskly reactive to light. CN III, IV, VI: extraocular movement are normal. No ptosis. CN V: Facial sensation is intact to pinprick in all 3 divisions bilaterally. Corneal responses are intact.  CN VII: Face is symmetric with normal eye closure and smile. CN VIII: Hearing is normal to rubbing fingers CN IX, X: Palate elevates symmetrically. Phonation is normal. CN XI: Head turning and shoulder shrug are intact CN XII: Tongue is midline with normal movements and no atrophy.  MOTOR: There is no pronator drift of out-stretched arms. Muscle bulk and tone are normal. Muscle strength is normal.  REFLEXES: Reflexes are 2+ and symmetric at the biceps, triceps, knees, and ankles. Plantar responses are flexor.  SENSORY: Intact to light touch, pinprick, positional sensation and vibratory sensation are intact in fingers and toes.  COORDINATION: Rapid alternating movements and fine finger movements are intact. There is no dysmetria on finger-to-nose and heel-knee-shin.    GAIT/STANCE: Posture is normal. Gait is steady with normal steps, base, arm swing, and turning. Heel and toe walking are normal. Tandem gait is normal.  Romberg is absent.   DIAGNOSTIC DATA (LABS, IMAGING, TESTING) - I reviewed patient records, labs, notes, testing and imaging myself where available.   ASSESSMENT AND PLAN  RONDARIUS KADRMAS is a 50 y.o. male   Recurrent passing out episode History of hypoxic encephalopathy in March 2014 Multiple  vascular risk factors, hypertension, poorly controlled, today's blood pressure 200/100, diabetes, insulin-dependent, A1c was 11 point 8, hyperlipidemia,  complex partial seizure remained to be on the differentiation list Depakote ER 500 mg every night was helpful, he has much less spells, but he does not like the side effect of nightmare, agitations,   Will change him to Keppra 500 mg twice a day, continue document or event  Marcial Pacas, M.D. Ph.D.  [facility] [phone]  [place], [place] [address] Phone: [phone] Fax:      [phone]

## 2016-09-25 ENCOUNTER — Ambulatory Visit (INDEPENDENT_AMBULATORY_CARE_PROVIDER_SITE_OTHER): Payer: No Typology Code available for payment source | Admitting: Family Medicine

## 2016-09-25 VITALS — BP 142/98 | HR 65 | Temp 98.3°F | Ht 70.0 in | Wt 234.6 lb

## 2016-09-25 DIAGNOSIS — Z1211 Encounter for screening for malignant neoplasm of colon: Secondary | ICD-10-CM

## 2016-09-25 DIAGNOSIS — I10 Essential (primary) hypertension: Secondary | ICD-10-CM

## 2016-09-25 DIAGNOSIS — E119 Type 2 diabetes mellitus without complications: Secondary | ICD-10-CM

## 2016-09-25 LAB — POCT GLYCOSYLATED HEMOGLOBIN (HGB A1C): HEMOGLOBIN A1C: 13.6

## 2016-09-25 MED ORDER — INSULIN ASPART 100 UNIT/ML ~~LOC~~ SOLN: 10.0000 [IU] | Freq: Every day | SUBCUTANEOUS | 99 refills | Status: DC

## 2016-09-25 MED ORDER — HYDROCHLOROTHIAZIDE 12.5 MG PO TABS: 12.5000 mg | ORAL_TABLET | Freq: Every day | ORAL | 3 refills | Status: DC

## 2016-09-25 MED ORDER — INSULIN GLARGINE 100 UNIT/ML SOLOSTAR PEN: 30.0000 [IU] | PEN_INJECTOR | Freq: Every day | SUBCUTANEOUS | 11 refills | Status: DC

## 2016-09-25 MED ORDER — BLOOD GLUCOSE MONITOR KIT: PACK | 12 refills | Status: DC

## 2016-09-25 NOTE — Progress Notes (Signed)
Subjective: CC: DM, HTN HPI: Jeffrey [name] is a 51 y.o. male presenting to clinic today for follow up. Concerns today include:  1. Diabetes:  Patient reports that he has self increased Lantus over the last several months.  He reports he started on Lantus 15 but that this was not controlling his sugars.  He is now using Lantus 30-35 units per night. (stayed around 100 with not eating).  Not using meal time insulin.  Reporting High at home: 560, Fasting: 118, Low at home: 118. Endorses numbness/ tingling in extremities.  Denies fever, chills, dizziness, LOC, polyuria, polydipsia, or chest pain. Last foot exam: 07/2015 Last A1c: 11.8 (05/2016) Nephropathy screen indicated?: on ARB  2. Hypertension Patient reports that his Blood pressure at home: 200/100s sometimes. Blood pressure today: 142/98. Compliant with Benicar 40, Lopressor 100mg  BID.  He notes that sometimes he takes up to 3 pills in 24 hours of Metoprolol.  Upon further questioning, he notes that this is not at one time but he is not spacing exactly 12 hours between doses. He reports multiple reasons he has not come in to have this addressed and why he did not bring his meter to calibrate against the office's.  Denies headache, dizziness, visual changes, nausea, vomiting, chest pain, abdominal pain or shortness of breath.  3. Preventative care Patient due for colonoscopy.  He has never had one done.  No hematochezia, melena, unplanned weight loss, fevers, chills.  Social History Reviewed. needs colonoscopy. FamHx and MedHx reviewed.  Please see EMR. ROS: Per HPI  Objective: Office vital signs reviewed. BP (!) 142/98   Pulse 65   Temp 98.3 F (36.8 C) (Oral)   Ht 5\' 10"  (1.778 m)   Wt 234 lb 9.6 oz (106.4 kg)   SpO2 98%   BMI 33.66 kg/m   Physical Examination:  General: Awake, alert, well nourished, No acute distress HEENT: Normal    Eyes: EOMI, sclera white Cardio: regular rate and rhythm, S1S2 heard, no murmurs  appreciated Pulm: clear to auscultation bilaterally, no wheezes, rhonchi or rales; normal work of breathing on room air Skin: dry; intact; no rashes or lesions  Diabetic Foot Form - Detailed   Diabetic Foot Exam - detailed Diabetic Foot exam was performed with the following findings:  Yes 09/25/2016 10:18 AM  Visual Foot Exam completed.:  Yes  Is there a history of foot ulcer?:  No Can the patient see the bottom of their feet?:  Yes Are the shoes appropriate in style and fit?:  Yes Is there swelling or and abnormal foot shape?:  No Are the toenails long?:  No Are the toenails thick?:  Yes Do you have pain in calf while walking?:  No Is there a claw toe deformity?:  No Is there elevated skin temparature?:  No Is there limited skin dorsiflexion?:  No Is there foot or ankle muscle weakness?:  No Are the toenails ingrown?:  No Normal Range of Motion:  Yes Pulse Foot Exam completed.:  Yes  Right posterior Tibialias:  Present Left posterior Tibialias:  Present  Right Dorsalis Pedis:  Present Left Dorsalis Pedis:  Present  Sensory Foot Exam Completed.:  Yes Swelling:  No Semmes-Weinstein Monofilament Test R Foot Test Control:  Pos L Foot Test Control:  Pos  R Site 1-Great Toe:  Pos L Site 1-Great Toe:  Pos  R Site 4:  Pos L Site 4:  Pos  R Site 5:  Pos L Site 5:  Pos  Comments:  Normal monofilament exam. No ulceration/ callouses appreciated.    Results for orders placed or performed in visit on 09/25/16 (from the past 48 hour(s))  HgB A1c     Status: Abnormal   Collection Time: 09/25/16 10:00 AM  Result Value Ref Range   Hemoglobin A1C 13.6     Assessment/ Plan: 50 y.o. male   Type 2 diabetes mellitus with hemoglobin A1c goal of less than 7.5% ([place]) Since patient is tolerating Lantus 30u qhs, will continue to keep him on that dose.  Discussed care with Dr Valentina Lucks.  Will add meal time insulin.  Discussed with patient that Novolog 5u with breakfast and lunch, then 10 u with  dinner was an option if he wanted to be aggressive about BG control.  Alternatively, 10u with largest meal of the day was an option as well if he was concerned about hypoglycemia.  Patient requested the latter.  A1c is up from previous and patient is reporting less intake.  We reviewed at length that it was essential he ate immediately following insulin administration to avoid hypoglycemic episodes.  He voiced good understanding.  DM foot exam performed today.  WNL.  Will have him follow up with Dr Valentina Lucks in 2 weeks for further insulin titration.  HYPERTENSION, BENIGN SYSTEMIC BP not at goal.  Continue Metoprolol 100mg  BID, Benicar 40mg  daily.  Renal function WNL.  Add HCTZ 12.5mg  daily.  Will plan to reassess BP at 2 week DM follow up.  If still not at goal, will plan to increase HCTZ to 25mg  daily.  Screening for colon cancer - Ambulatory referral to Gastroenterology - Colonoscopy handout provided  [name], DO PGY-3, [facility]

## 2016-09-25 NOTE — Patient Instructions (Signed)
Follow up with Dr Valentina Lucks in 2 weeks for diabetes/ insulin titration.

## 2016-09-26 NOTE — Assessment & Plan Note (Addendum)
Since patient is tolerating Lantus 30u qhs, will continue to keep him on that dose.  Discussed care with Dr Valentina Lucks.  Will add meal time insulin.  Discussed with patient that Novolog 5u with breakfast and lunch, then 10 u with dinner was an option if he wanted to be aggressive about BG control.  Alternatively, 10u with largest meal of the day was an option as well if he was concerned about hypoglycemia.  Patient requested the latter.  A1c is up from previous and patient is reporting less intake.  We reviewed at length that it was essential he ate immediately following insulin administration to avoid hypoglycemic episodes.  He voiced good understanding.  DM foot exam performed today.  WNL.  Will have him follow up with Dr Valentina Lucks in 2 weeks for further insulin titration.

## 2016-09-26 NOTE — Assessment & Plan Note (Signed)
BP not at goal.  Continue Metoprolol 100mg  BID, Benicar 40mg  daily.  Renal function WNL.  Add HCTZ 12.5mg  daily.  Will plan to reassess BP at 2 week DM follow up.  If still not at goal, will plan to increase HCTZ to 25mg  daily.

## 2016-10-02 ENCOUNTER — Telehealth: Payer: Self-pay | Admitting: *Deleted

## 2016-10-02 ENCOUNTER — Other Ambulatory Visit: Payer: Self-pay | Admitting: Family Medicine

## 2016-10-02 MED ORDER — INSULIN GLULISINE 100 UNIT/ML SOLOSTAR PEN: 10.0000 [IU] | PEN_INJECTOR | Freq: Every day | SUBCUTANEOUS | 11 refills | Status: DC

## 2016-10-02 MED ORDER — INSULIN GLARGINE 100 UNIT/ML SOLOSTAR PEN: 30.0000 [IU] | PEN_INJECTOR | Freq: Every day | SUBCUTANEOUS | 1 refills | Status: DC

## 2016-10-02 NOTE — Telephone Encounter (Addendum)
Pt given 2 Samples of lantus pens. Jeffrey [name], Jeffrey [name] D, [name]

## 2016-10-02 NOTE — Telephone Encounter (Signed)
We do not carry samples of apidra, nor is he being prescribed this.  As far as I know, he is only on Lantus and Novolog.  Please confirm this with patient, as he should not be taking 2 short acting medications simultaneously (novolog, apidra).

## 2016-10-02 NOTE — Telephone Encounter (Signed)
Rx to be faxed. VM also left for Dawn at [place] program.

## 2016-10-02 NOTE — Telephone Encounter (Signed)
Received message on nurse line from [facility] at [place] [phone]). States pt dropped off Rx for Lantus solostar at 1230 today. The only novolog they have comes in vial and patient does not want this. Requesting Rx for Apidra Solostar to replace novolog. May fax Rx to [phone] or e-scribe.  Hubbard Hartshorn, RN, BSN

## 2016-10-02 NOTE — Telephone Encounter (Signed)
Pt calling in to see if he could get some samples of lantus until the 11th when they fill his rx. Please advise. Deseree Kennon Holter, CMA

## 2016-10-02 NOTE — Telephone Encounter (Signed)
Yes, please call patient back and have him pick up 2 sample pens to last him.  I have called the [facility] program and left a voicemail, asking them to call me back.  He should be receiving quantity sufficient for 1 month.  He should also be up for refill given his increased dose.  If the pharmacy calls back, please give them this information for his current rx.  If new rx needs to be written, please let me know.

## 2016-10-02 NOTE — Telephone Encounter (Signed)
Spoke to pt. He will come by to pick up the samples (2 boxes) of Lantus after 1:30 today.[name] He asked if he can get the Rx for Apidra Solostar. He can get that for free. He would like to pick up the Rx when he comes to get the Lantus.. Please advise. Ottis Stain, CMA

## 2016-10-10 ENCOUNTER — Encounter: Payer: Self-pay | Admitting: Pharmacist

## 2016-10-10 ENCOUNTER — Ambulatory Visit (INDEPENDENT_AMBULATORY_CARE_PROVIDER_SITE_OTHER): Payer: No Typology Code available for payment source | Admitting: Pharmacist

## 2016-10-10 DIAGNOSIS — E1169 Type 2 diabetes mellitus with other specified complication: Secondary | ICD-10-CM

## 2016-10-10 DIAGNOSIS — E7800 Pure hypercholesterolemia, unspecified: Secondary | ICD-10-CM

## 2016-10-10 DIAGNOSIS — E785 Hyperlipidemia, unspecified: Secondary | ICD-10-CM

## 2016-10-10 DIAGNOSIS — K7460 Unspecified cirrhosis of liver: Secondary | ICD-10-CM

## 2016-10-10 DIAGNOSIS — I10 Essential (primary) hypertension: Secondary | ICD-10-CM

## 2016-10-10 DIAGNOSIS — E119 Type 2 diabetes mellitus without complications: Secondary | ICD-10-CM

## 2016-10-10 DIAGNOSIS — K7030 Alcoholic cirrhosis of liver without ascites: Secondary | ICD-10-CM

## 2016-10-10 MED ORDER — HYDROCHLOROTHIAZIDE 12.5 MG PO TABS: 12.5000 mg | ORAL_TABLET | Freq: Every day | ORAL | 3 refills | Status: DC

## 2016-10-10 MED ORDER — LACTULOSE 10 GM/15ML PO SOLN: 10.0000 g | Freq: Every day | ORAL | 3 refills | Status: DC

## 2016-10-10 MED ORDER — ATORVASTATIN CALCIUM 80 MG PO TABS: 80.0000 mg | ORAL_TABLET | Freq: Every day | ORAL | 3 refills | Status: DC

## 2016-10-10 MED ORDER — INSULIN ASPART 100 UNIT/ML FLEXPEN: 5.0000 [IU] | PEN_INJECTOR | Freq: Three times a day (TID) | SUBCUTANEOUS | 0 refills | Status: DC

## 2016-10-10 NOTE — Progress Notes (Signed)
Patient ID: Jeffrey [name], male   DOB: June 28, 1966, 51 y.o.   MRN: [id number] Reviewed: Agree with Dr. Graylin Shiver documentation and management.

## 2016-10-10 NOTE — Patient Instructions (Addendum)
Start Novolog 5 units three times daily before meals (only take this if you are eating a meal).  You can start Apidra at the same dose after you finish the sample of Novolog Continue Lantus 30 units daily  Please continue to monitor your blood sugars 1-2 times per day prior to meals until you receive your new test strips. Please call clinic if you have increased symptoms or signs of low blood sugars.   Start taking Atorvastatin 80 mg once daily Start taking Lactulose 15 mL once daily  Atorvastatin and Lactulose are at [place] your Keppra to twice daily instead of once daily.    Start hydrochlorothiazide 12.5 mg once daily  Hydrochlorothiazide, Lantus and Novolog are at [place] [facility].

## 2016-10-10 NOTE — Assessment & Plan Note (Signed)
Diabetes longstanding diagnosed currently uncontrolled. Patient denies hypoglycemic events and is able to verbalize appropriate hypoglycemia management plan. Patient reports adherence with medication for the medications he has on hand. Control is suboptimal due to inability to afford medications.  Continued basal insulin Lantus (insulin glargine) 30 units daily. Increased dose of rapid insulin Novolog (insulin aspart) to 5 units TID before meals and sample of Novolog was provided. Patient will transition from Novolog to Apidra once his sample is depleted and he picks up Lantus and Apidra from [place] [facility].  Next A1C anticipated February 2018.  Instructed patient to alternate checking CBGs prior to breakfast, lunch and supper until he receives test strips from [facility].

## 2016-10-10 NOTE — Assessment & Plan Note (Signed)
Hypertension longstanding diagnosed currently uncontrolled.  Patient reports adherence with medication but he has not started HCTZ. Control is suboptimal due to inability to afford medications. Will restart HCTZ 12.5 mg daily.  Rx was sent to [place] [facility] as this is an orange card medication.

## 2016-10-10 NOTE — Assessment & Plan Note (Signed)
Hepatic Cirrhosis:  Patient not currently taking Lactulose but with normal mental status today in clinic.  Lactulose 10 g (15 ml) daily sent to [facility] outpatient pharmacy with copay of $12 as [facility] program did not carry lactulose.

## 2016-10-10 NOTE — Assessment & Plan Note (Signed)
ASCVD risk greater than 7.5%. Continued Aspirin 81 mg and Continued atorvastatin 80 mg.  Refill has been sent to [facility] outpatient pharmacy due to lower cost availability.

## 2016-10-10 NOTE — Progress Notes (Signed)
S:    Patient arrives in good spirits ambulating with assistance of a cane.  Presents for diabetes evaluation, education, and management at the request of Dr Lajuana Ripple. Patient was referred on 09/25/16.  Patient was last seen by Primary Care Provider on 09/25/16.   Patient reports Diabetes was diagnosed in 2001. Reports being on insulin ~2003.   Patient reports adherence with medications that he has on hand from medication assistance.  Obtains medications from [facility] (HTN medications) and receives insulin from [facility].   Per [facility] patient is orange card eligible.  Current diabetes medications include: Lantus 30 units daily, Apidra 10 units before largest meal (not yet started due to not receiving medication from [facility]) Current hypertension medications include: metoprolol tartrate 100 mg BID, olmesartan 40 mg daily  Reports Not currently taking lactulose due to [facility] and [facility] not being able to supply medication. He has 2 pens of Lantus remaining but has not received his Apidra from [facility] yet.    Does not have meter with him today.  Has been checking 1-2 times a day but only has limited test strips as he has not received new meter/strips from [facility] yet.   Reports Yesterday fasting CBG reports was 91 mg/dL.  Reports highest reading in 400s and lowest was 91 mg/dL.  Reports most readings over past 4 days in 100s.    Patient denies hypoglycemic events. Reports proper treatment knowledge.  Has had hypoglycemia in the past with shakiness and sweating.   Patient reported dietary habits: Eats 2 meals/day  Breakfast:eggs, grits, milk Lunch:bologna sandwich with honey wheat bread Dinner: Baked chicken with rice or noodles or peas or green beans.   Snacks:Dunkin sticks when sugar gets low which is rare Drinks:Water but is not drinking as much.    Patient reported exercise habits: Walk every day >10K steps/day.    Patient reports nocturia 2-3 times  per night.    Patient reports neuropathy which has increased over the past 6 months.  Patient reports visual changes when his sugar is high with symptoms of blurred vision.   Patient reports self foot exams. Denies changes.  Reports Dr Arnetha Gula checked his feet at his last exam.      O:  Lab Results  Component Value Date   HGBA1C 13.6 09/25/2016   Vitals:   10/10/16 1107  BP: (!) 170/100  Pulse: 72    Lipid Panel     Component Value Date/Time   CHOL 193 05/27/2016 1123   TRIG 290 (H) 05/27/2016 1123   HDL 45 05/27/2016 1123   CHOLHDL 4.3 05/27/2016 1123   VLDL 58 (H) 05/27/2016 1123   LDLCALC 90 05/27/2016 1123   LDLDIRECT 89 12/23/2012 1414     A/P: Diabetes longstanding diagnosed currently uncontrolled. Patient denies hypoglycemic events and is able to verbalize appropriate hypoglycemia management plan. Patient reports adherence with medication for the medications he has on hand. Control is suboptimal due to inability to afford medications.  Continued basal insulin Lantus (insulin glargine) 30 units daily. Increased dose of rapid insulin Novolog (insulin aspart) to 5 units TID before meals and sample of Novolog was provided. Patient will transition from Novolog to Apidra once his sample is depleted and he picks up Lantus and Apidra from [facility].  Next A1C anticipated February 2018.  Instructed patient to alternate checking CBGs prior to breakfast, lunch and supper until he receives test strips from [facility].  ASCVD risk greater than 7.5%. Continued Aspirin 81 mg and Continued atorvastatin 80 mg.  Refill has been sent to [facility] outpatient pharmacy due to lower cost availability.  Hypertension longstanding diagnosed currently uncontrolled.  Patient reports adherence with medication but he has not started HCTZ. Control is suboptimal due to inability to afford medications. Will restart HCTZ 12.5 mg daily.  Rx was sent to [facility] as this is an orange card  medication.   Hepatic Cirrhosis:  Patient not currently taking Lactulose but with normal mental status today in clinic.  Lactulose 10 g (15 ml) daily sent to [facility] outpatient pharmacy with copay of $12 as [facility] program did not carry lactulose.    History of Seizures:  Patient currently taking Keppra once daily instead of prescribed twice daily. Discussed him taking Keppra twice daily as prescribed.   Written patient instructions provided.  Total time in face to face counseling 60 minutes.   Follow up in Pharmacist Clinic Visit in 2 weeks.   Patient seen with Bennye Alm, PharmD, BCPS and Mikael Spray, PharmD Candidate.

## 2016-10-23 ENCOUNTER — Other Ambulatory Visit: Payer: Self-pay | Admitting: *Deleted

## 2016-10-23 MED ORDER — NAPROXEN 500 MG PO TABS: 500.0000 mg | ORAL_TABLET | Freq: Two times a day (BID) | ORAL | 1 refills | Status: DC

## 2016-10-24 ENCOUNTER — Ambulatory Visit: Payer: No Typology Code available for payment source | Admitting: Pharmacist

## 2016-10-28 ENCOUNTER — Encounter: Payer: Self-pay | Admitting: Physician Assistant

## 2016-11-05 ENCOUNTER — Other Ambulatory Visit: Payer: Self-pay | Admitting: *Deleted

## 2016-11-05 ENCOUNTER — Ambulatory Visit: Payer: No Typology Code available for payment source | Admitting: Physician Assistant

## 2016-11-05 ENCOUNTER — Other Ambulatory Visit: Payer: Self-pay | Admitting: Family Medicine

## 2016-11-05 DIAGNOSIS — I10 Essential (primary) hypertension: Secondary | ICD-10-CM

## 2016-11-05 MED ORDER — OLMESARTAN MEDOXOMIL 40 MG PO TABS: 40.0000 mg | ORAL_TABLET | Freq: Every day | ORAL | 2 refills | Status: DC

## 2016-11-05 MED ORDER — OLMESARTAN MEDOXOMIL 40 MG PO TABS
40.0000 mg | ORAL_TABLET | Freq: Every day | ORAL | 2 refills | Status: DC
Start: 2016-11-05 — End: 2016-11-05

## 2016-11-13 ENCOUNTER — Ambulatory Visit: Payer: No Typology Code available for payment source | Admitting: Physician Assistant

## 2016-12-03 ENCOUNTER — Encounter: Payer: Self-pay | Admitting: Family Medicine

## 2016-12-03 ENCOUNTER — Ambulatory Visit (INDEPENDENT_AMBULATORY_CARE_PROVIDER_SITE_OTHER): Payer: No Typology Code available for payment source | Admitting: Family Medicine

## 2016-12-03 VITALS — BP 142/90 | HR 75 | Temp 98.4°F | Ht 70.0 in | Wt 239.4 lb

## 2016-12-03 DIAGNOSIS — N529 Male erectile dysfunction, unspecified: Secondary | ICD-10-CM

## 2016-12-03 DIAGNOSIS — R569 Unspecified convulsions: Secondary | ICD-10-CM

## 2016-12-03 DIAGNOSIS — E119 Type 2 diabetes mellitus without complications: Secondary | ICD-10-CM

## 2016-12-03 LAB — POCT GLYCOSYLATED HEMOGLOBIN (HGB A1C): HEMOGLOBIN A1C: 12.9

## 2016-12-03 MED ORDER — OMEPRAZOLE 20 MG PO CPDR: 20.0000 mg | DELAYED_RELEASE_CAPSULE | Freq: Every day | ORAL | 3 refills | Status: DC

## 2016-12-03 MED ORDER — SILDENAFIL CITRATE 100 MG PO TABS: 100.0000 mg | ORAL_TABLET | Freq: Every day | ORAL | 3 refills | Status: DC | PRN

## 2016-12-03 NOTE — Progress Notes (Signed)
    Subjective: CC: Seizure/ DM follow up HPI: Jeffrey [name] is a 51 y.o. male presenting to clinic today for:  1. Seizure like activity Patient reports that his falls/ seizure like activity was improving after visit with Neurology.  He notes improvement on Keppra.  He notes that over the last week he has had increased falls/ activity.  He did not call neurology because he was unsure if he needed a new referral.  He has not checked his sugar after these episodes.  No fecal or urinary incontinence.  He describes sensation like he feels like he is going to fall.  He reports he starts shaking and falls.  This lasts about 10 seconds.  He is conscious throughout the episodes.  He notes that he is unable to stop episodes but he is lucid throughout the events.  2. Diabetes:  High at home: 180 Low at home: 66 in January Taking medications: Lantus 30u, Apidra 5 w/ breakfast, 10u with largest meal of day. ROS: denies fever, chills, dizziness, LOC, polyuria, polydipsia or chest pain. Last A1c: 13.6 (09/25/16)  3. Erectile dysfunction He reports that he used to use Viagra about 6+ years ago but stopped because of insurance.  He reports he is not able to maintain an erection.  He is able to get one.  He reports that Viagra did work well for him.  Social Hx reviewed. MedHx, medications and allergies reviewed.  Please see EMR. ROS: Per HPI  Objective: Office vital signs reviewed. BP (!) 142/90   Pulse 75   Temp 98.4 F (36.9 C) (Oral)   Ht 5\' 10"  (1.778 m)   Wt 239 lb 6.4 oz (108.6 kg)   SpO2 99%   BMI 34.35 kg/m   Physical Examination:  General: Awake, alert, well nourished, right eye covered by patch, No acute distress Throat: moist mucus membranes Cardio: regular rate and rhythm, S1S2 heard, no murmurs appreciated Pulm: clear to auscultation bilaterally, no wheezes, rhonchi or rales; normal work of breathing on room air MSK: normal gait and normal station  Results for orders placed  or performed in visit on 12/03/16 (from the past 24 hour(s))  HgB A1c     Status: Abnormal   Collection Time: 12/03/16 10:20 AM  Result Value Ref Range   Hemoglobin A1C 12.9    Assessment/ Plan: 51 y.o. male   1. Type 2 diabetes mellitus with hemoglobin A1c goal of less than 7.5% ([place]).  Uncontrolled.  A1c 12.9 today.  Seen by Dr Valentina Lucks in 10/2016, never returned for 2 week follow up.  I would like to titrate his insulin further but he is having lows ranging from 46-67 at least once weekly. - Recommend scheduling appt for insulin titration with Dr Valentina Lucks, as instructed - HgB A1c  2. Seizure-like activity ([place]) - Recommend follow up with neuro - Continue Keppra at current dose for now - Ambulatory referral to Neurology  3. Erectile dysfunction, unspecified erectile dysfunction type - Viagra 100mg  tablets rx'd.  Take 1/2 to 1 tablet po as needed for coitus - Precautions for medication use reviewed including prolonged erection, evaluation for CP/ administration of any nitro containing products. - Weight loss, BG control will likely be key in his ED symptoms.  Follow up in 3 months or sooner if needed  [name], DO PGY-3, [facility]

## 2016-12-03 NOTE — Patient Instructions (Signed)
I want you to schedule an appointment with Dr Valentina Lucks for follow up in 2 weeks.  Schedule an appointment with your Neurologist for your seizure like activity.

## 2016-12-11 ENCOUNTER — Other Ambulatory Visit: Payer: Self-pay | Admitting: Family Medicine

## 2016-12-11 DIAGNOSIS — I10 Essential (primary) hypertension: Secondary | ICD-10-CM

## 2016-12-12 ENCOUNTER — Other Ambulatory Visit: Payer: Self-pay | Admitting: *Deleted

## 2016-12-12 MED ORDER — GABAPENTIN 300 MG PO CAPS: 300.0000 mg | ORAL_CAPSULE | Freq: Three times a day (TID) | ORAL | 3 refills | Status: DC

## 2016-12-13 ENCOUNTER — Ambulatory Visit: Payer: No Typology Code available for payment source | Admitting: Family Medicine

## 2016-12-18 ENCOUNTER — Other Ambulatory Visit: Payer: Self-pay | Admitting: *Deleted

## 2016-12-18 MED ORDER — DEXLANSOPRAZOLE 30 MG PO CPDR: 30.0000 mg | DELAYED_RELEASE_CAPSULE | Freq: Every day | ORAL | 5 refills | Status: DC

## 2016-12-19 ENCOUNTER — Ambulatory Visit: Payer: No Typology Code available for payment source | Admitting: Pharmacist

## 2016-12-23 ENCOUNTER — Ambulatory Visit: Payer: Self-pay | Admitting: Neurology

## 2016-12-23 ENCOUNTER — Telehealth: Payer: Self-pay | Admitting: *Deleted

## 2016-12-23 NOTE — Telephone Encounter (Signed)
Arrived to consult but was unable to be seen.  He did not have his updated [facility] information.

## 2016-12-24 ENCOUNTER — Encounter: Payer: Self-pay | Admitting: Neurology

## 2017-03-04 ENCOUNTER — Other Ambulatory Visit: Payer: Self-pay | Admitting: *Deleted

## 2017-03-04 MED ORDER — NAPROXEN 500 MG PO TABS: 500.0000 mg | ORAL_TABLET | Freq: Two times a day (BID) | ORAL | 0 refills | Status: DC

## 2017-03-06 ENCOUNTER — Other Ambulatory Visit: Payer: Self-pay | Admitting: Family Medicine

## 2017-03-22 ENCOUNTER — Telehealth: Payer: Self-pay | Admitting: Internal Medicine

## 2017-03-22 NOTE — Telephone Encounter (Addendum)
Patient's wife called because patient not feeling well and hasn't been wanting to get out of bed. He had been out of his medications for 10 days (naproxen, depakote, atorvastatin, folic acid, metoprolol, gabapentin, omeprazole, HCTZ) and restarted them Monday. Patient says he is not in any pain. He has just been extremely tired. No falls. He would like clinic appointment with enough notice to arrange transportation. Scheduled with Dr. Gerlean Ren on 03/26/17 for 10:30 a.m. appointment. Patient also plans to re-enroll in [place] program.  Olene Floss, MD [place], PGY-2

## 2017-03-26 ENCOUNTER — Encounter: Payer: Self-pay | Admitting: Family Medicine

## 2017-03-26 ENCOUNTER — Ambulatory Visit (INDEPENDENT_AMBULATORY_CARE_PROVIDER_SITE_OTHER): Payer: Self-pay | Admitting: Family Medicine

## 2017-03-26 VITALS — BP 140/98 | HR 72 | Temp 98.7°F | Ht 70.0 in | Wt 231.0 lb

## 2017-03-26 DIAGNOSIS — R531 Weakness: Secondary | ICD-10-CM

## 2017-03-26 DIAGNOSIS — M542 Cervicalgia: Secondary | ICD-10-CM

## 2017-03-26 LAB — POCT GLYCOSYLATED HEMOGLOBIN (HGB A1C): Hemoglobin A1C: 12.7

## 2017-03-26 MED ORDER — GABAPENTIN 300 MG PO CAPS: 300.0000 mg | ORAL_CAPSULE | Freq: Three times a day (TID) | ORAL | 3 refills | Status: DC

## 2017-03-26 MED ORDER — NAPROXEN 500 MG PO TABS: 500.0000 mg | ORAL_TABLET | Freq: Two times a day (BID) | ORAL | 0 refills | Status: DC

## 2017-03-26 MED ORDER — OMEPRAZOLE 20 MG PO CPDR: 20.0000 mg | DELAYED_RELEASE_CAPSULE | Freq: Every day | ORAL | 3 refills | Status: DC

## 2017-03-26 MED ORDER — CYCLOBENZAPRINE HCL 10 MG PO TABS: 10.0000 mg | ORAL_TABLET | Freq: Three times a day (TID) | ORAL | 0 refills | Status: DC | PRN

## 2017-03-26 NOTE — Patient Instructions (Signed)
I suspect your weakness is due to poor eating. Please try to eat at least three meals per day. We will check several labs today. I have refilled some of your medications and also given you a muscle relaxer. You may also try an over the counter Salonpas patch to help with the neck pain. Please follow up in 1-2 weeks with your PCP.  Dr. Gerlean Ren

## 2017-03-27 ENCOUNTER — Telehealth: Payer: Self-pay | Admitting: Family Medicine

## 2017-03-27 DIAGNOSIS — R7989 Other specified abnormal findings of blood chemistry: Secondary | ICD-10-CM

## 2017-03-27 LAB — COMPREHENSIVE METABOLIC PANEL
A/G RATIO: 1.1 — AB (ref 1.2–2.2)
ALK PHOS: 191 IU/L — AB (ref 39–117)
ALT: 32 IU/L (ref 0–44)
AST: 85 IU/L — ABNORMAL HIGH (ref 0–40)
Albumin: 4.1 g/dL (ref 3.5–5.5)
BILIRUBIN TOTAL: 0.5 mg/dL (ref 0.0–1.2)
BUN/Creatinine Ratio: 13 (ref 9–20)
BUN: 35 mg/dL — ABNORMAL HIGH (ref 6–24)
CALCIUM: 9.5 mg/dL (ref 8.7–10.2)
CHLORIDE: 87 mmol/L — AB (ref 96–106)
CO2: 21 mmol/L (ref 20–29)
Creatinine, Ser: 2.79 mg/dL — ABNORMAL HIGH (ref 0.76–1.27)
GFR calc Af Amer: 29 mL/min/{1.73_m2} — ABNORMAL LOW (ref >59)
GFR, EST NON AFRICAN AMERICAN: 25 mL/min/{1.73_m2} — AB (ref >59)
GLOBULIN, TOTAL: 3.7 g/dL (ref 1.5–4.5)
Glucose: 119 mg/dL — ABNORMAL HIGH (ref 65–99)
POTASSIUM: 4.5 mmol/L (ref 3.5–5.2)
SODIUM: 128 mmol/L — AB (ref 134–144)
Total Protein: 7.8 g/dL (ref 6.0–8.5)

## 2017-03-27 LAB — TSH: TSH: 1.79 u[IU]/mL (ref 0.450–4.500)

## 2017-03-27 LAB — CBC
HEMOGLOBIN: 12.6 g/dL — AB (ref 13.0–17.7)
Hematocrit: 38.4 % (ref 37.5–51.0)
MCH: 30.7 pg (ref 26.6–33.0)
MCHC: 32.8 g/dL (ref 31.5–35.7)
MCV: 94 fL (ref 79–97)
PLATELETS: 181 10*3/uL (ref 150–379)
RBC: 4.1 x10E6/uL — AB (ref 4.14–5.80)
RDW: 14.8 % (ref 12.3–15.4)
WBC: 7.5 10*3/uL (ref 3.4–10.8)

## 2017-03-27 NOTE — Telephone Encounter (Signed)
Significant worsening of kidney function noted on labs. Referral to Nephrology placed. To return to office Monday (unable to arrange transportation for tomorrow) to further discuss. To bring medications to visit to review what he is taking. Encouraged to continue to work on hydration and eating 3 meals per day over weekend. Red flag symptoms discussed and to go to ED if these occur.   Dr. Gerlean Ren

## 2017-03-30 NOTE — Progress Notes (Deleted)
   Subjective:   Patient ID: Jeffrey [name]    DOB: January 15, 1966, 51 y.o. male   MRN: [id number]  CC: "***"  HPI: Jeffrey [name] is a 51 y.o. male who presents to clinic today ***. Problems discussed today are as follows:  ***: *** ROS: ***  ***Seen 6/20 for weakness found to be HTN 140/98 with creatinine elevation, BL 1.1. Cr 2.79, BUN 35, with GFR 29, Na 128, Cl 87, alk phos 191, AST 85. Was not eating much and has EtOH abuse. Ask CAGE. Referral placed. On gabapentin, HCTZ, lopressor, naproxen.  Complete ROS performed, see HPI for pertinent.  [place]: IDDM, hepatic cirrhosis, HTN with h/o cardiac arrest, OSA (on CPAP), EtOH abuse, ED. H/o EGD x2, cataract, stress test 03/2013. Mother and father w/o medical history. Smoking status reviewed. Medications reviewed.  Objective:   There were no vitals taken for this visit. Vitals and nursing note reviewed.  General: well nourished, well developed, in no acute distress with non-toxic appearance HEENT: normocephalic, atraumatic, moist mucous membranes Neck: supple, non-tender without lymphadenopathy CV: regular rate and rhythm without murmurs, rubs, or gallops, no lower extremity edema Lungs: clear to auscultation bilaterally with normal work of breathing Abdomen: soft, non-tender, non-distended, no masses or organomegaly palpable, normoactive bowel sounds Skin: warm, dry, no rashes or lesions, cap refill < 2 seconds Extremities: warm and well perfused, normal tone  Assessment & Plan:   No problem-specific Assessment & Plan notes found for this encounter.  No orders of the defined types were placed in this encounter.  No orders of the defined types were placed in this encounter.   Harriet Butte, [place], PGY-1 03/30/2017 10:18 PM

## 2017-03-30 NOTE — Progress Notes (Signed)
Subjective:     Patient ID: Jeffrey [name], male   DOB: Feb 26, 1966, 51 y.o.   MRN: [id number]  HPI Jeffrey [name] is a 51yo male presenting today for fatigue. Reports he was out of his medications for 10 days, but was able to get them refilled and restarted them on Monday 6/18. Reports he has been tired and stays in bed almost 24/7. Reports 103month history of neck pain, only when standing up. Neck pain previously improved with Flexeril and he requests refill of this medication, as well as Naproxen. Denies fever. Does note decreased appetite and some nausea x4 days. Reports he has not eaten anything in 4 days, but has been drinking plenty of water. Denies changes in urine output. Denies shortness of breath or chest pain. Denies bloody or dark stools. Denies confusion. Denies constipation, diarrhea, vomiting.  Review of Systems Per HPI    Objective:   Physical Exam  Constitutional: He is oriented to person, place, and time. He appears well-developed and well-nourished. No distress.  HENT:  Mouth/Throat: Oropharynx is clear and moist.  Eyes: Pupils are equal, round, and reactive to light.  Pink conjunctiva  Neck:  Spasming of neck muscles noted with attempted OMM. Negative Kernig and Brudzinski sign.  Cardiovascular: Normal rate and regular rhythm.   No murmur heard. Pulmonary/Chest: Effort normal. No respiratory distress. He has no wheezes.  Abdominal: Soft. He exhibits no distension. There is no tenderness.  Musculoskeletal: He exhibits no edema.  Muscle strength 5/5 in upper and lower extremities bilaterally  Neurological: He is alert and oriented to person, place, and time. He has normal reflexes. No cranial nerve deficit.  Psychiatric: He has a normal mood and affect. His behavior is normal.      Assessment and Plan:     1. Weakness Diffuse, not localized on exam. Suspect associated with decreased appetite, with Jeffrey [name] reporting no solid foods for 4 days. Will check TSH, CMP, CBC,  and A1C. PHQ also obtained and revealed score of 20, no SI or HI--underlying depression may also be associated with fatigue, poor appetite, and decreased energy. Recommend increasing PO intake. Return precautions given. Follow up pending lab results, anticipate 1-2 weeks.  2. Neck Pain. Neck spasms noted on exam. No meningeal signs noted. Afebrile. Flexeril refilled. Also recommend Salonpas patches.

## 2017-03-31 ENCOUNTER — Ambulatory Visit: Payer: Self-pay | Admitting: Family Medicine

## 2017-04-01 ENCOUNTER — Other Ambulatory Visit: Payer: Self-pay | Admitting: *Deleted

## 2017-04-01 MED ORDER — NAPROXEN 500 MG PO TABS: 500.0000 mg | ORAL_TABLET | Freq: Two times a day (BID) | ORAL | 0 refills | Status: DC

## 2017-04-02 ENCOUNTER — Ambulatory Visit: Payer: Self-pay | Admitting: Student

## 2017-04-08 ENCOUNTER — Ambulatory Visit (INDEPENDENT_AMBULATORY_CARE_PROVIDER_SITE_OTHER): Payer: Self-pay | Admitting: Family Medicine

## 2017-04-08 ENCOUNTER — Encounter: Payer: Self-pay | Admitting: Family Medicine

## 2017-04-08 VITALS — BP 130/84 | HR 86 | Temp 98.6°F | Wt 226.0 lb

## 2017-04-08 DIAGNOSIS — E1142 Type 2 diabetes mellitus with diabetic polyneuropathy: Secondary | ICD-10-CM

## 2017-04-08 DIAGNOSIS — N179 Acute kidney failure, unspecified: Secondary | ICD-10-CM

## 2017-04-08 DIAGNOSIS — R569 Unspecified convulsions: Secondary | ICD-10-CM | POA: Insufficient documentation

## 2017-04-08 DIAGNOSIS — E871 Hypo-osmolality and hyponatremia: Secondary | ICD-10-CM | POA: Insufficient documentation

## 2017-04-08 DIAGNOSIS — I10 Essential (primary) hypertension: Secondary | ICD-10-CM

## 2017-04-08 DIAGNOSIS — G40909 Epilepsy, unspecified, not intractable, without status epilepticus: Secondary | ICD-10-CM

## 2017-04-08 MED ORDER — GLUCOSE BLOOD VI STRP: ORAL_STRIP | 1 refills | Status: DC

## 2017-04-08 MED ORDER — RELION CONFIRM GLUCOSE MONITOR W/DEVICE KIT: PACK | 0 refills | Status: DC

## 2017-04-08 NOTE — Assessment & Plan Note (Signed)
Likely medication induced. He is on Depakote and HCTZ. Recheck Bmet today. Monitor for improvement.

## 2017-04-08 NOTE — Patient Instructions (Signed)
It was nice seeing you today. Please continue your current insulin regimen. Check your Glucose at home 3 times daily. I will like you to come in next week with your glucose number to see your PCP and also the pharmacist. I will contact you with your test result. Please take multivitamin daily to stimulate your appetite. See you next week.

## 2017-04-08 NOTE — Assessment & Plan Note (Addendum)
Poor medication adherence. A1C checked during last visit was 12. He has been hovering around 12. For now no medication adjustment. I recommended CBG check and documentation for his PCP f/u next week. Adjust Insulin base on his result. I also recommended follow-up with Dr. Valentina Lucks. Glucometer escribed today. For his neuropathy, he will benefit from increasing Gabapentin dose. PCP to readdress at next visit.

## 2017-04-08 NOTE — Assessment & Plan Note (Signed)
Stable on Depakote. He came with his medication bottle. Take Keppra off his list. Continue management by neurologist.

## 2017-04-08 NOTE — Assessment & Plan Note (Signed)
BP looks good. I am concern that he is off Benicar and BP looks ok. He is currently unable to pick up meds due to insurance. Recheck BP at next visit. If looking good despite not being on Benicar may D/C HCTZ especially with him having increase creatinine level. He needs to be on Benicar due to DM 2.

## 2017-04-08 NOTE — Assessment & Plan Note (Signed)
Likely due to poor oral intake vs medication induced ( HCTZ,prilosec and Benicar) I recommended improvement in his oral hydration. Take MVI to stimulate his appetite. Bmet recheck today. I will contact him with further management plan based on result. Note: He was referred to nephrologist as well by Dr. Gerlean Ren. Referral pending due to insurance issue. Return precaution discussed.

## 2017-04-08 NOTE — Progress Notes (Signed)
Subjective:     Patient ID: Jeffrey [name], male   DOB: 1965/11/30, 51 y.o.   MRN: [id number]  HPI CVE:LFYB to follow up on his test result. He denies any GU symptoms. He endorsed poor appetite and poor oral intake over the last 2 days. His has been on and off for a while now. No other concern. Hyponatremia: Here for follow-up. No concern. DM2/Neuropathy:Here for follow up with his DM. He stated he stopped taking his Apidra since his appetite has been poor. He takes Lantus 15 units often instead of 390 units due to his poor appetite. He has not been checking his CBG often due to poorly functioning meter. He c/o worsening pins and needle sensation of his hands for  >6 months. His current dose of Gabapentin is not effective. HTN:He is compliant with his meds except Benicar which he ran out of.. Here for follow up. Seizure:Patient stated he is now on Depakote instead of Keppra. His medication is being managed by his neurologist. No recent seizure episode.   Current Outpatient Prescriptions on File Prior to Visit  Medication Sig Dispense Refill  . aspirin EC 81 MG tablet Take 81 mg by mouth at bedtime.     [name] atorvastatin (LIPITOR) 80 MG tablet Take 1 tablet (80 mg total) by mouth daily. 30 tablet 3  . blood glucose meter kit and supplies KIT Dispense based on patient and insurance preference. Use up to four times daily as directed. (FOR ICD-9 250.00, 250.01). 1 each 12  . cyclobenzaprine (FLEXERIL) 10 MG tablet Take 1 tablet (10 mg total) by mouth 3 (three) times daily as needed for muscle spasms. 30 tablet 0  . Dexlansoprazole 30 MG capsule Take 1 capsule (30 mg total) by mouth daily. 30 capsule 5  . folic acid (FOLVITE) 1 MG tablet Take 1 tablet (1 mg total) by mouth daily. 30 tablet 12  . gabapentin (NEURONTIN) 300 MG capsule Take 1 capsule (300 mg total) by mouth 3 (three) times daily. 90 capsule 3  . glucose blood test strip Use as instructed 100 each 12  . hydrochlorothiazide (HYDRODIURIL) 25  MG tablet TAKE 1/2 Tablet BY MOUTH ONCE DAILY 45 tablet 0  . Insulin Glargine (LANTUS) 100 UNIT/ML Solostar Pen Inject 30 Units into the skin daily at 10 pm. 15 mL 1  . Insulin Glulisine (APIDRA SOLOSTAR) 100 UNIT/ML Solostar Pen Inject 10 Units into the skin daily. With your largest meal of the day. (Patient not taking: Reported on 10/10/2016) 15 mL 11  . Insulin Pen Needle 31G X 8 MM MISC BD UltraFine III Pen Needles. For use with insulin pen device. Inject insulin 6 x daily 200 each 3  . Insulin Syringes, Disposable, U-100 0.3 ML MISC Use one to inject insulin twice daily 100 each 0  . lactulose (CHRONULAC) 10 GM/15ML solution Take 15 mLs (10 g total) by mouth daily. 473 mL 3  . levETIRAcetam (KEPPRA) 750 MG tablet Take 1 tablet (750 mg total) by mouth 2 (two) times daily. (Patient taking differently: Take 750 mg by mouth daily. ) 60 tablet 11  . metoprolol (LOPRESSOR) 100 MG tablet TAKE 1 Tablet  BY MOUTH TWICE DAILY 90 tablet 3  . Multiple Vitamin (MULTIVITAMIN WITH MINERALS) TABS tablet Take 1 tablet by mouth daily.    . naproxen (NAPROSYN) 500 MG tablet Take 1 tablet (500 mg total) by mouth 2 (two) times daily with a meal. 60 tablet 0  . olmesartan (BENICAR) 40 MG tablet Take 1 tablet (  40 mg total) by mouth daily. 90 tablet 2  . omeprazole (PRILOSEC) 20 MG capsule Take 1 capsule (20 mg total) by mouth daily. 30 capsule 3  . polyvinyl alcohol (LIQUIFILM TEARS) 1.4 % ophthalmic solution Place 1 drop into both eyes every 4 (four) hours as needed for dry eyes. (Patient not taking: Reported on 10/10/2016) 15 mL 2  . sildenafil (VIAGRA) 100 MG tablet Take 1 tablet (100 mg total) by mouth daily as needed for erectile dysfunction. 10 tablet 3  . thiamine (VITAMIN B-1) 100 MG tablet Take 1 tablet (100 mg total) by mouth daily. 30 tablet 12   No current facility-administered medications on file prior to visit.    Past Medical History:  Diagnosis Date  . Acute renal insufficiency 12/08/2012  . AKI (acute  kidney injury) ([place])   . ALCOHOL ABUSE, HX OF 11/10/2007  . Anoxic encephalopathy ([place])   . Anxiety   . Bleeding ulcer 2014  . Blood transfusion 2014   "related to bleeding ulcer"  . Cardiac arrest ([place]) 12/07/2012   Anoxic encephalopathy  . GERD (gastroesophageal reflux disease)   . High cholesterol   . Hypertension   . OSA on CPAP 12/07/2012  . Seizure-like activity ([place])   . Type II diabetes mellitus ([place])   . Venous insufficiency 12/13/2011   Vitals:   04/08/17 1001  BP: 130/84  Pulse: 86  Temp: 98.6 F (37 C)  TempSrc: Oral  SpO2: 99%  Weight: 226 lb (102.5 kg)     Review of Systems  Respiratory: Negative.   Gastrointestinal: Negative.   Genitourinary: Negative.   Musculoskeletal: Negative.   Neurological:       Paraesthesia   All other systems reviewed and are negative.      Objective:   Physical Exam  Constitutional: He is oriented to person, place, and time. He appears well-developed. No distress.  Cardiovascular: Normal rate, regular rhythm and normal heart sounds.   No murmur heard. Pulmonary/Chest: Effort normal and breath sounds normal. No respiratory distress. He has no wheezes.  Abdominal: Soft. Bowel sounds are normal. He exhibits no distension and no mass. There is no tenderness.  Musculoskeletal: Normal range of motion. He exhibits no edema or tenderness.  Neurological: He is alert and oriented to person, place, and time. He has normal strength and normal reflexes. No cranial nerve deficit. Coordination normal.  Nursing note and vitals reviewed.      Assessment:     Acute Kidney Impairment Hyponatremia DM2 DM neuropathy HTN Seizure disorder    Plan:     Check problem list.

## 2017-04-09 ENCOUNTER — Telehealth: Payer: Self-pay | Admitting: Family Medicine

## 2017-04-09 LAB — BASIC METABOLIC PANEL
BUN/Creatinine Ratio: 7 — ABNORMAL LOW (ref 9–20)
BUN: 9 mg/dL (ref 6–24)
CALCIUM: 9.1 mg/dL (ref 8.7–10.2)
CHLORIDE: 84 mmol/L — AB (ref 96–106)
CO2: 24 mmol/L (ref 20–29)
Creatinine, Ser: 1.29 mg/dL — ABNORMAL HIGH (ref 0.76–1.27)
GFR calc non Af Amer: 63 mL/min/{1.73_m2} (ref >59)
GFR, EST AFRICAN AMERICAN: 73 mL/min/{1.73_m2} (ref >59)
GLUCOSE: 211 mg/dL — AB (ref 65–99)
Potassium: 3.7 mmol/L (ref 3.5–5.2)
Sodium: 129 mmol/L — ABNORMAL LOW (ref 134–144)

## 2017-04-09 NOTE — Telephone Encounter (Signed)
Unable to reach patient. Message left to contact us about test result.  In general his kidney function improved as well as sodium level. As discussed with him, he need to review his epileptic medication and HCTZ  as a potential cause of his hyponatremia. He will follow-up with PCP next week to review meds.

## 2017-04-15 ENCOUNTER — Ambulatory Visit: Payer: Self-pay | Admitting: Family Medicine

## 2017-04-17 ENCOUNTER — Ambulatory Visit: Payer: Self-pay

## 2017-04-18 ENCOUNTER — Ambulatory Visit: Payer: Self-pay | Admitting: Pharmacist

## 2017-04-18 ENCOUNTER — Ambulatory Visit: Payer: Self-pay | Admitting: Family Medicine

## 2017-05-02 ENCOUNTER — Encounter: Payer: Self-pay | Admitting: Family Medicine

## 2017-05-02 ENCOUNTER — Ambulatory Visit (INDEPENDENT_AMBULATORY_CARE_PROVIDER_SITE_OTHER): Payer: Self-pay | Admitting: Family Medicine

## 2017-05-02 VITALS — BP 134/88 | HR 67 | Temp 97.8°F | Ht 70.0 in | Wt 241.4 lb

## 2017-05-02 DIAGNOSIS — F528 Other sexual dysfunction not due to a substance or known physiological condition: Secondary | ICD-10-CM

## 2017-05-02 DIAGNOSIS — Z1211 Encounter for screening for malignant neoplasm of colon: Secondary | ICD-10-CM

## 2017-05-02 DIAGNOSIS — E119 Type 2 diabetes mellitus without complications: Secondary | ICD-10-CM

## 2017-05-02 DIAGNOSIS — N179 Acute kidney failure, unspecified: Secondary | ICD-10-CM

## 2017-05-02 MED ORDER — TADALAFIL 10 MG PO TABS: 10.0000 mg | ORAL_TABLET | Freq: Every day | ORAL | 0 refills | Status: DC | PRN

## 2017-05-02 NOTE — Progress Notes (Signed)
   CC: f/u chronic conditions  HPI  Diabetes Mellitus: Patient presents for follow up of diabetes. Symptoms: hyperglycemia, hypoglycemia  and paresthesia of the feet. Symptoms have been intermittent. Patient denies foot ulcerations, polydipsia and polyuria.  Evaluation to date has been included: hemoglobin A1C.  Home sugars: BGs have been labile ranging between 46 and 185. Low symptoms for him include - hands shaking, sweating. CBG was 46 last night - OJ. Insulin is free, but takes a while for it to come. Some times in the past he goes without long acting insulin and relies on the mealtime (he reports that the MAP program has been intermittently out of Lantus at the time he needs to pick up refills). He is "not a strong eater." Regimen per his report is: 20U Lantus QHS, Apidra 10U daily but hasn't been using this. Feels his appetite has improved with MVI. Left his meter at home because his transportation came early today. A1c was 12 when he was 1 month without insulin. Has had insulin for the last 2-3 weeks. CBGs now in the 100s. Nothing over 185 in the last month.   Was without benicar x 1 month and just got this. BP checks at home - no. No meter.   CC, SH/smoking status, and VS noted  Objective: BP 134/88   Pulse 67   Temp 97.8 F (36.6 C) (Oral)   Ht 5\' 10"  (1.778 m)   Wt 241 lb 6.4 oz (109.5 kg)   SpO2 99%   BMI 34.64 kg/m  Gen: NAD, alert, cooperative, and pleasant obese male. HEENT: NCAT, EOMI, PERRL CV: RRR, no murmur Resp: CTAB, no wheezes, non-labored Abd: SNTND, BS present, no guarding or organomegaly Ext: 1+ edema, warm Neuro: Alert and oriented, Speech clear, No gross deficits  Assessment and plan:  AKI (acute kidney injury) Trending appropriately last check. Will recheck BMP today. Reports better PO intake.  ERECTILE DYSFUNCTION Requests change from viagra to something else. Prescribed Cialis, but recommended that tighter CBG control will decrease his erectile  dysfunction.  Type 2 diabetes mellitus with hemoglobin A1c goal of less than 7.5% (HCC) Patient reports he was out of insulin x 1 month during the last 3 months, which could be the cause of elevated HgbA1c. He reports frequent lows. Encouraged regular meals. Decreased Lantus to 18U. Patient did not bring any medications or glucometer today. Instructed he bring both of these to see Dr. Valentina Lucks next week.    Orders Placed This Encounter  Procedures  . Fecal occult blood, imunochemical    Standing Status:   Future    Standing Expiration Date:   05/02/2018  . Basic metabolic panel    Meds ordered this encounter  Medications  . tadalafil (CIALIS) 10 MG tablet    Sig: Take 1 tablet (10 mg total) by mouth daily as needed for erectile dysfunction.    Dispense:  10 tablet    Refill:  0    Health Maintenance reviewed - FOBT ordered as above.  Ralene Ok, MD, PGY2 05/02/2017 2:18 PM

## 2017-05-02 NOTE — Assessment & Plan Note (Signed)
Patient reports he was out of insulin x 1 month during the last 3 months, which could be the cause of elevated HgbA1c. He reports frequent lows. Encouraged regular meals. Decreased Lantus to 18U. Patient did not bring any medications or glucometer today. Instructed he bring both of these to see Dr. Valentina Lucks next week.

## 2017-05-02 NOTE — Patient Instructions (Signed)
It was a pleasure to see you today! Thank you for choosing [facility] for your primary care. Jeffrey [name] was seen for DM, HTN.   Our plans for today were:  Decrease your Lantus to 18U every night.   Call us if you run out of insulin.   Bring all of your medicines to see Dr. Valentina Lucks next week.   To keep you healthy, we need to monitor some screening tests. You are due for colonoscopy. Let's do the FOBT testing.   You should return to our clinic to see Dr. Lindell Noe in 2 months for DM.   Best,  Dr. Lindell Noe

## 2017-05-02 NOTE — Assessment & Plan Note (Signed)
Requests change from viagra to something else. Prescribed Cialis, but recommended that tighter CBG control will decrease his erectile dysfunction.

## 2017-05-02 NOTE — Assessment & Plan Note (Signed)
Trending appropriately last check. Will recheck BMP today. Reports better PO intake.

## 2017-05-03 LAB — BASIC METABOLIC PANEL
BUN/Creatinine Ratio: 14 (ref 9–20)
BUN: 20 mg/dL (ref 6–24)
CALCIUM: 9.6 mg/dL (ref 8.7–10.2)
CHLORIDE: 96 mmol/L (ref 96–106)
CO2: 27 mmol/L (ref 20–29)
Creatinine, Ser: 1.39 mg/dL — ABNORMAL HIGH (ref 0.76–1.27)
GFR, EST AFRICAN AMERICAN: 67 mL/min/{1.73_m2} (ref >59)
GFR, EST NON AFRICAN AMERICAN: 58 mL/min/{1.73_m2} — AB (ref >59)
Glucose: 73 mg/dL (ref 65–99)
Potassium: 4.9 mmol/L (ref 3.5–5.2)
Sodium: 138 mmol/L (ref 134–144)

## 2017-05-05 ENCOUNTER — Other Ambulatory Visit: Payer: Self-pay | Admitting: *Deleted

## 2017-05-05 MED ORDER — NAPROXEN 500 MG PO TABS: 500.0000 mg | ORAL_TABLET | Freq: Two times a day (BID) | ORAL | 0 refills | Status: DC

## 2017-05-08 ENCOUNTER — Encounter: Payer: Self-pay | Admitting: Pharmacist

## 2017-05-08 ENCOUNTER — Ambulatory Visit (INDEPENDENT_AMBULATORY_CARE_PROVIDER_SITE_OTHER): Payer: Self-pay | Admitting: Pharmacist

## 2017-05-08 VITALS — BP 158/103 | HR 74 | Ht 70.5 in | Wt 258.6 lb

## 2017-05-08 DIAGNOSIS — K7460 Unspecified cirrhosis of liver: Secondary | ICD-10-CM

## 2017-05-08 DIAGNOSIS — J811 Chronic pulmonary edema: Secondary | ICD-10-CM

## 2017-05-08 DIAGNOSIS — E119 Type 2 diabetes mellitus without complications: Secondary | ICD-10-CM

## 2017-05-08 DIAGNOSIS — I10 Essential (primary) hypertension: Secondary | ICD-10-CM

## 2017-05-08 DIAGNOSIS — K7030 Alcoholic cirrhosis of liver without ascites: Secondary | ICD-10-CM

## 2017-05-08 MED ORDER — HYDROCHLOROTHIAZIDE 25 MG PO TABS: 25.0000 mg | ORAL_TABLET | Freq: Every day | ORAL | 0 refills | Status: DC

## 2017-05-08 MED ORDER — LACTULOSE 10 GM/15ML PO SOLN: 10.0000 g | Freq: Every day | ORAL | 3 refills | Status: DC

## 2017-05-08 MED ORDER — INSULIN GLARGINE 100 UNIT/ML SOLOSTAR PEN: 10.0000 [IU] | PEN_INJECTOR | Freq: Every day | SUBCUTANEOUS | 1 refills | Status: DC

## 2017-05-08 MED ORDER — FUROSEMIDE 20 MG PO TABS
40.0000 mg | ORAL_TABLET | Freq: Once | ORAL | Status: AC
  Administered 2017-05-08: 40 mg via ORAL

## 2017-05-08 NOTE — Progress Notes (Signed)
    S:     Chief Complaint  Patient presents with  . Medication Management    Diabetes    Patient arrives in good spirits, ambulating without assistance.  Presents for diabetes evaluation, education, and management at the request of Dr. Lindell Noe. Patient was referred on 05/02/2017.  Patient was last seen by Primary Care Provider on 05/02/2017.   Reports taking all meds at night and inquires which he should be taking in the mornings. Complains of swelling in legs, gradual over the last few weeks, coupled with increasing weight despite poor appetite. Reports that he has not heard from a nephrologist yet but is expecting a call. Reports he is out of lactulose but has all other meds currently. Sometimes he is out of meds x 1 week because they are shipped to him via a medication assistance program.   Patient reports adherence with medications; occasionally out x 1 week, otherwise takes his medications as prescribed.  Current diabetes medications include: lantus 18 units daily, apidra 3-5 units with meals  Current hypertension medications include: HCTZ 12.5 mg daily, metoprolol tartrate 100 mg BID, olmesartan 40 mg daily.   Patient reports frequent hypoglycemic events. BG 45 early this AM; reports hypoglycemia 1-3 times a day without distinguishable pattern  Patient reported dietary habits: Eats 1-2 small meals/day Usually eats sandwiches: bologna, cheddar, mayo with wheat bread Drinks: milk, water, juice (with low sugar)  Patient reported exercise habits: walks 2 miles a day to friend's house   Patient reports nocturia.  Patient reports neuropathy. Patient reports visual changes. Blurry vision, cannot see in right eye Patient reports self foot exams.   O:  Physical Exam  Constitutional: He appears well-developed and well-nourished.  Musculoskeletal: He exhibits edema.  Vitals reviewed. 3+ bilateral pitting edema  Review of Systems  All other systems reviewed and are  negative.    Lab Results  Component Value Date   HGBA1C 12.7 03/26/2017   Vitals:   05/08/17 0959 05/08/17 1000  BP: (!) 166/101 (!) 158/103  Pulse:  74    Home fasting CBG: 140-160s  10 year ASCVD risk: 27.7%  A/P: Diabetes longstanding currently uncontrolled as evidenced by A1C and patient report of variable CBG. Patient reports hypoglycemic events and is able to verbalize appropriate hypoglycemia management plan. Patient reports adherence with medications when he has them (occasionally runs out x1 week). Control is suboptimal due to frequent hypoglycemia, variable diet/poor appetite.  -Decreased dose of basal insulin Lantus (insulin glargine) from 18 to 10 units. Continued rapid insulin Apidra (insulin glulisine) to 3-5.  -Next A1C anticipated September, 2018.   -ASCVD risk greater than 7.5%. Continued Aspirin 81 mg and Continued atorvastatin 80 mg.   Hypertension longstanding currently uncontrolled.  Patient reports adherence with medication. Control is suboptimal due to fluid overload. Edema assessed with PCP,  Dr. Lindell Noe -Increased HCTZ to 25 mg daily; advised to take in the morning -Furosemide 40 mg PO x1 today - ordered at the request of Dr. Lindell Noe.    Prescription for lactulose also provided at request of patient by Dr. Lindell Noe.   Written patient instructions provided.  Total time in face to face counseling 25 minutes.   Follow up in Pharmacist Clinic Visit 2 weeks.   Patient seen with Cleotis Lema, PharmD Candidate, Deirdre Pippins, PharmD, PGY2 Pharmacy Resident.

## 2017-05-08 NOTE — Assessment & Plan Note (Signed)
Hypertension longstanding currently uncontrolled.  Patient reports adherence with medication. Control is suboptimal due to fluid overload. Edema assessed with PCP,  Dr. Lindell Noe -Increased HCTZ to 25 mg daily; advised to take in the morning -Furosemide 40 mg PO x1 today - ordered at the request of Dr. Lindell Noe.

## 2017-05-08 NOTE — Assessment & Plan Note (Signed)
Diabetes longstanding currently uncontrolled as evidenced by A1C and patient report of variable CBG. Patient reports hypoglycemic events and is able to verbalize appropriate hypoglycemia management plan. Patient reports adherence with medications when he has them (occasionally runs out x1 week). Control is suboptimal due to frequent hypoglycemia, variable diet/poor appetite.  -Decreased dose of basal insulin Lantus (insulin glargine) from 18 to 10 units. Continued rapid insulin Apidra (insulin glulisine) to 3-5.  -Next A1C anticipated September, 2018.   -ASCVD risk greater than 7.5%. Continued Aspirin 81 mg and Continued atorvastatin 80 mg.

## 2017-05-08 NOTE — Patient Instructions (Addendum)
Thanks for coming to see [name] today!   1. Decrease your lantus to 10 units once a day  2. Continue taking your apidra 3-5 units with your biggest meals  3. Increase your hydrochlorothiazide to 1 whole tablet (25 mg) once a day. Change to taking this in the mornings.   4. Take the furosemide 40 mg pill today once you get home.   Come back to see Dr. Lindell Noe in 1 week and see me in two weeks.

## 2017-05-12 NOTE — Progress Notes (Signed)
Patient ID: Jeffrey [name], male   DOB: 1965/10/15, 51 y.o.   MRN: [id number] Reviewed: Agree with Dr. Graylin Shiver documentation and management.

## 2017-05-23 ENCOUNTER — Ambulatory Visit: Payer: Self-pay | Admitting: Pharmacist

## 2017-05-29 ENCOUNTER — Other Ambulatory Visit: Payer: Self-pay | Admitting: Family Medicine

## 2017-05-29 DIAGNOSIS — E1169 Type 2 diabetes mellitus with other specified complication: Secondary | ICD-10-CM

## 2017-05-29 DIAGNOSIS — E785 Hyperlipidemia, unspecified: Principal | ICD-10-CM

## 2017-06-09 ENCOUNTER — Inpatient Hospital Stay (HOSPITAL_COMMUNITY)
Admission: EM | Admit: 2017-06-09 | Discharge: 2017-06-12 | DRG: 638 | Disposition: A | Discharge status: Home / Self Care | Payer: Self-pay | Attending: Family Medicine | Admitting: Family Medicine

## 2017-06-09 ENCOUNTER — Encounter (HOSPITAL_COMMUNITY): Payer: Self-pay

## 2017-06-09 DIAGNOSIS — E1121 Type 2 diabetes mellitus with diabetic nephropathy: Secondary | ICD-10-CM | POA: Diagnosis present

## 2017-06-09 DIAGNOSIS — K219 Gastro-esophageal reflux disease without esophagitis: Secondary | ICD-10-CM | POA: Diagnosis present

## 2017-06-09 DIAGNOSIS — I10 Essential (primary) hypertension: Secondary | ICD-10-CM

## 2017-06-09 DIAGNOSIS — Z8719 Personal history of other diseases of the digestive system: Secondary | ICD-10-CM

## 2017-06-09 DIAGNOSIS — F17220 Nicotine dependence, chewing tobacco, uncomplicated: Secondary | ICD-10-CM | POA: Diagnosis present

## 2017-06-09 DIAGNOSIS — F1020 Alcohol dependence, uncomplicated: Secondary | ICD-10-CM | POA: Diagnosis present

## 2017-06-09 DIAGNOSIS — Z794 Long term (current) use of insulin: Secondary | ICD-10-CM

## 2017-06-09 DIAGNOSIS — N182 Chronic kidney disease, stage 2 (mild): Secondary | ICD-10-CM | POA: Diagnosis present

## 2017-06-09 DIAGNOSIS — K7030 Alcoholic cirrhosis of liver without ascites: Secondary | ICD-10-CM

## 2017-06-09 DIAGNOSIS — R319 Hematuria, unspecified: Secondary | ICD-10-CM | POA: Diagnosis present

## 2017-06-09 DIAGNOSIS — E1142 Type 2 diabetes mellitus with diabetic polyneuropathy: Secondary | ICD-10-CM | POA: Diagnosis present

## 2017-06-09 DIAGNOSIS — E1122 Type 2 diabetes mellitus with diabetic chronic kidney disease: Secondary | ICD-10-CM | POA: Diagnosis present

## 2017-06-09 DIAGNOSIS — R7989 Other specified abnormal findings of blood chemistry: Secondary | ICD-10-CM | POA: Diagnosis present

## 2017-06-09 DIAGNOSIS — Z23 Encounter for immunization: Secondary | ICD-10-CM

## 2017-06-09 DIAGNOSIS — E11319 Type 2 diabetes mellitus with unspecified diabetic retinopathy without macular edema: Secondary | ICD-10-CM | POA: Diagnosis present

## 2017-06-09 DIAGNOSIS — I129 Hypertensive chronic kidney disease with stage 1 through stage 4 chronic kidney disease, or unspecified chronic kidney disease: Secondary | ICD-10-CM | POA: Diagnosis present

## 2017-06-09 DIAGNOSIS — E669 Obesity, unspecified: Secondary | ICD-10-CM | POA: Diagnosis present

## 2017-06-09 DIAGNOSIS — Z8674 Personal history of sudden cardiac arrest: Secondary | ICD-10-CM

## 2017-06-09 DIAGNOSIS — E1110 Type 2 diabetes mellitus with ketoacidosis without coma: Principal | ICD-10-CM | POA: Diagnosis present

## 2017-06-09 DIAGNOSIS — Z833 Family history of diabetes mellitus: Secondary | ICD-10-CM

## 2017-06-09 DIAGNOSIS — Z9114 Patient's other noncompliance with medication regimen: Secondary | ICD-10-CM

## 2017-06-09 DIAGNOSIS — Z79899 Other long term (current) drug therapy: Secondary | ICD-10-CM

## 2017-06-09 DIAGNOSIS — G4733 Obstructive sleep apnea (adult) (pediatric): Secondary | ICD-10-CM | POA: Diagnosis present

## 2017-06-09 DIAGNOSIS — E785 Hyperlipidemia, unspecified: Secondary | ICD-10-CM | POA: Diagnosis present

## 2017-06-09 DIAGNOSIS — D649 Anemia, unspecified: Secondary | ICD-10-CM | POA: Diagnosis present

## 2017-06-09 DIAGNOSIS — K7460 Unspecified cirrhosis of liver: Secondary | ICD-10-CM

## 2017-06-09 DIAGNOSIS — N179 Acute kidney failure, unspecified: Secondary | ICD-10-CM

## 2017-06-09 DIAGNOSIS — G40909 Epilepsy, unspecified, not intractable, without status epilepticus: Secondary | ICD-10-CM | POA: Diagnosis present

## 2017-06-09 DIAGNOSIS — Z6836 Body mass index (BMI) 36.0-36.9, adult: Secondary | ICD-10-CM

## 2017-06-09 DIAGNOSIS — E860 Dehydration: Secondary | ICD-10-CM | POA: Diagnosis present

## 2017-06-09 LAB — I-STAT CHEM 8, ED
BUN: 42 mg/dL — ABNORMAL HIGH (ref 6–20)
CALCIUM ION: 0.93 mmol/L — AB (ref 1.15–1.40)
Chloride: 87 mmol/L — ABNORMAL LOW (ref 101–111)
Creatinine, Ser: 3.1 mg/dL — ABNORMAL HIGH (ref 0.61–1.24)
HCT: 40 % (ref 39.0–52.0)
HEMOGLOBIN: 13.6 g/dL (ref 13.0–17.0)
Potassium: 6 mmol/L — ABNORMAL HIGH (ref 3.5–5.1)
SODIUM: 121 mmol/L — AB (ref 135–145)
TCO2: 22 mmol/L (ref 22–32)

## 2017-06-09 LAB — CBC
HEMATOCRIT: 33.7 % — AB (ref 39.0–52.0)
Hemoglobin: 11.4 g/dL — ABNORMAL LOW (ref 13.0–17.0)
MCH: 31.8 pg (ref 26.0–34.0)
MCHC: 33.8 g/dL (ref 30.0–36.0)
MCV: 94.1 fL (ref 78.0–100.0)
PLATELETS: 295 10*3/uL (ref 150–400)
RBC: 3.58 MIL/uL — ABNORMAL LOW (ref 4.22–5.81)
RDW: 15.9 % — AB (ref 11.5–15.5)
WBC: 7.5 10*3/uL (ref 4.0–10.5)

## 2017-06-09 LAB — URINALYSIS, ROUTINE W REFLEX MICROSCOPIC
BILIRUBIN URINE: NEGATIVE
Glucose, UA: >=500 mg/dL — AB
KETONES UR: 5 mg/dL — AB
LEUKOCYTES UA: NEGATIVE
Nitrite: NEGATIVE
Protein, ur: 30 mg/dL — AB
Specific Gravity, Urine: 1.024 (ref 1.005–1.030)
pH: 5 (ref 5.0–8.0)

## 2017-06-09 LAB — BASIC METABOLIC PANEL
Anion gap: 19 — ABNORMAL HIGH (ref 5–15)
BUN: 29 mg/dL — AB (ref 6–20)
CHLORIDE: 82 mmol/L — AB (ref 101–111)
CO2: 18 mmol/L — AB (ref 22–32)
CREATININE: 3.15 mg/dL — AB (ref 0.61–1.24)
Calcium: 8.7 mg/dL — ABNORMAL LOW (ref 8.9–10.3)
GFR calc Af Amer: 25 mL/min — ABNORMAL LOW (ref >60)
GFR calc non Af Amer: 21 mL/min — ABNORMAL LOW (ref >60)
Glucose, Bld: 844 mg/dL (ref 65–99)
POTASSIUM: 6.3 mmol/L — AB (ref 3.5–5.1)
SODIUM: 119 mmol/L — AB (ref 135–145)

## 2017-06-09 LAB — CBG MONITORING, ED
GLUCOSE-CAPILLARY: 510 mg/dL — AB (ref 65–99)
GLUCOSE-CAPILLARY: 517 mg/dL — AB (ref 65–99)
Glucose-Capillary: >600 mg/dL (ref 65–99)

## 2017-06-09 MED ORDER — SODIUM CHLORIDE 0.9 % IV BOLUS (SEPSIS)
1000.0000 mL | Freq: Once | INTRAVENOUS | Status: AC
  Administered 2017-06-09: 1000 mL via INTRAVENOUS

## 2017-06-09 MED ORDER — SODIUM CHLORIDE 0.9 % IV SOLN
INTRAVENOUS | Status: AC
  Administered 2017-06-09: 5.4 [IU]/h via INTRAVENOUS
  Filled 2017-06-09: qty 1

## 2017-06-09 MED ORDER — SODIUM CHLORIDE 0.9 % IV SOLN
INTRAVENOUS | Status: DC
  Administered 2017-06-09 – 2017-06-11 (×3): via INTRAVENOUS

## 2017-06-09 MED ORDER — DEXTROSE 50 % IV SOLN: 25.0000 mL | INTRAVENOUS | Status: DC | PRN

## 2017-06-09 MED ORDER — INSULIN REGULAR BOLUS VIA INFUSION
0.0000 [IU] | Freq: Three times a day (TID) | INTRAVENOUS | Status: DC
  Filled 2017-06-09: qty 10

## 2017-06-09 MED ORDER — DEXTROSE-NACL 5-0.45 % IV SOLN
INTRAVENOUS | Status: DC
  Administered 2017-06-10: 04:00:00 via INTRAVENOUS

## 2017-06-09 NOTE — ED Triage Notes (Signed)
Pt arrives EMS with hyperglycemia. Pt states he has been in bed x 5 days d/t leg weakness. Pt has hx of DM but states his monitor has been reading "error" at home. Pt reports insulin dose was decreased last week.

## 2017-06-09 NOTE — ED Notes (Signed)
PA notified on pt.'s abnormal lab tests.

## 2017-06-09 NOTE — ED Notes (Signed)
Abnormal Chem-8 reported to Dr. Jeanell Sparrow

## 2017-06-09 NOTE — H&P (Signed)
[facility] Admission History and Physical Service Pager: [phone]  Patient name: Jeffrey [name] Medical record number: [id number] Date of birth: 11/05/1965 Age: 51 y.o. Gender: male  Primary Care Provider: Sela Hilding, MD Consultants: none Code Status: Full  Chief Complaint: Fatigue and weakness   Assessment and Plan: Jeffrey [name] is a 51 y.o. male presenting with fatigue and weakness and found to have severe hyperglycemia with other labs findings consistent with DKA . PMH is significant for DM II, obesity, alcohol abuse, cardiac arrest, HTN, HLD, anxiety and OSA.  DKA/poorly controlled DM-II Patient presented with fatigue and weakness for the past 3 days. Patient reports poor po intake. On admission, initial BMP showed Glucose>844, Na+ 119, K+ 6.3, AG 19, Bicarb of 18 consistent with DKA. Patient reports recent decrease of Lantus from 20 U to 10 U. He also was supposed to be taking Apidra 3-5 U daily but denied doing so. A1c 12.7 on 03/26/2017. UA with glucose > 500, protein 30 & ketones 5. WBC 7.5 making infectious process less likely. On exam patient is stable with mild abdominal pain with palpation, but no kussmaul breathing or increase work of breathing. Current episode likely secondary to medication non adherence in the setting of recent regimen change. He was given 2L of NS in ED and started on gluco-stabilizers.  - Admit to FMTS, step down, admitting physician Dr. Ardelia Mems - IVF and Insulin per protocol for DKA.  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 On admission, Cr 3.15 (Baseline ~1.2). Anticipate improvement with rehydration. Recently seen in clinic for AKI with Cr of 2.79 on 6/20  thought to be due to poor oral intake vs medication induced (HCTZ, prilosec and Benicar), improved to 1.29 after improved oral intake. Elevated creatinine is likely due to uncontrolled T2DM with some early diabetic nephropathy contributing to worsening renal function.  Has a nephrology consult pending. - Continue IVF per protocol - Hold nephrotoxic agents (HCTZ) - Trend BMP  Hypertension Home regimen include on HCTZ 25 mg, Lopressor 100 mg BID and olmesartan 40 mg daily. On admission, BP159/93 gradually improved to 136/84. - Continue Metoprolol 100 mg bid - Hold Omersartan and HCTZ w/ AKI, resume when appropriate   Obstructive Sleep Apnea Reports compliance with CPAP at home - Will order CPAP nightly.   Abdominal Pain New onset left lower quadrant abdominal pain, tender to palpation. Likely secondary to initial DKA. -Continue to monitor symptoms  Alcohol abuse, chronic, uncontrol Previous history of abuse of EtOH. Admits to drinking a 40 oz beer daily at least 4 times a week. - CIWA --Folate and thiamine  - social work consult  Hepatic Cirrhosis, stable Patient does not show any stigmata of HE, mentation at baseline, no asterixis noted on exam.  Likely secondary to alcoholism. HCV and HBV negative last year. [name] on 03/09/2015 with hepatic steatosis -Hold home lactulose in the setting of dehydration, will resume with improving volume status and resolving DKA.  History of Seizure disorder, chronic, well controlled Stable on Depakote. No recent seizure. - Continue home dose  GERD Stable, on Prilosec. - Continue home medication for now  Hyperlipidemia Stable. Continue home meds - Continue atorvastatin and ASA  Vision  Changes Likely secondary to diabetic retinopathy --Continue Polyvinyl alcohol ophthalmic solution  FEN/GI: NPO except for sips with meds Glucomander Replete as needed  Prophylaxis: SubQ Heparin  Disposition: step down pending resolution of  DKA   History of Present Illness:  Jeffrey [name] is a 51 y.o. male presenting with weakness and not feeling well since Friday. Patient reports that he thought his bblood sugar may have ben high because he has been in DKA 4-5 times previously. He fell down in bathroom on Friday. Did not hit his head. He laid back down in bed and reports he did not get up until today to come into the hospital. He continued taking his meds every night. His meter only read error. Meter has never read error before. He reports that he is running low on blood strips and did not want to check it too much. Patient reports peeing more. He reports weight loss, but he's trying to lose weight. He reports being more thirsty and drinking more water. Reports abdominal pain in left lower quadrant.    Patient reports reading low for a while- 45-60, and a few readings of 30, so he went to the clinic, and the new doctor cut down the insulin to 10 U of Lantus at night, only. He reports that he was previously on 20 U of Lantus a day. He reports he does not use any short acting insulin or meal coverage.  In the ED patient received 2L of NS and was started on insulin drip after initial BMP showed blood glucose >700, K+ of 6.0, Na+121 and a creatinine of 3.15. Family medicine teaching service was consulted for admission.  Review Of Systems: Per HPI with the following additions:   Review of Systems  Constitutional: Positive for weight loss.  Eyes: Positive for blurred vision.  Cardiovascular: Negative for chest pain.  Gastrointestinal: Positive for abdominal pain and nausea. Negative for vomiting.  Genitourinary: Positive for frequency.  Musculoskeletal: Positive for falls.  Neurological: Positive for weakness.    Patient Active Problem List   Diagnosis Date Noted  . Hyponatremia 04/08/2017  . Seizure disorder ([place]) 04/08/2017  . Depression 12/17/2015  . Hip pain 12/17/2015  . Proteinuria 11/23/2015  . Loss of teeth 11/23/2015   . Diabetic polyneuropathy associated with type 2 diabetes mellitus ([place]) 08/04/2015  . Hepatic cirrhosis ([place]) 03/22/2015  . Ataxia 03/20/2015  . Aphasia   . Elevated liver enzymes   . Dysarthria 03/09/2015  . AKI (acute kidney injury) ([place])   . Alcohol intoxication ([place])   . Erosive esophagitis 10/10/2014  . HLD (hyperlipidemia)   . Passed out 10/08/2014  . Syncope and collapse   . OSA on CPAP 12/28/2012  . Pulmonary edema 12/08/2012  . Cardiac arrest ([place]) 12/07/2012  . Obesity 12/07/2012  . Anoxic brain injury ([place]) 12/07/2012  . GI bleed 11/26/2012  . Venous insufficiency 12/13/2011  . 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 ALCOHOL ABUSE, HX OF 11/10/2007  . ERECTILE DYSFUNCTION 04/14/2007  . Type 2 diabetes mellitus with hemoglobin A1c goal of less than 7.5% ([place]) 12/04/2006  . HYPERCHOLESTEROLEMIA 12/04/2006  . OBESITY, NOS 12/04/2006  . Anxiety state 12/04/2006  . HYPERTENSION, BENIGN SYSTEMIC 12/04/2006  . ARTHRITIS 12/04/2006    Past Medical History: Past Medical History:  Diagnosis Date  . Acute renal insufficiency 12/08/2012  . AKI (acute kidney injury) ([place])   . ALCOHOL ABUSE, HX OF 11/10/2007  . Anoxic encephalopathy ([place])   . Anxiety   .  Bleeding ulcer 2014  . Blood transfusion 2014   "related to bleeding ulcer"  . Cardiac arrest ([place]) 12/07/2012   Anoxic encephalopathy  . GERD (gastroesophageal reflux disease)   . High cholesterol   . Hypertension   . OSA on CPAP 12/07/2012  . Seizure-like activity ([place])   . Type II diabetes mellitus ([place])   . Venous insufficiency 12/13/2011    Past Surgical History: Past Surgical History:  Procedure Laterality Date  . CARDIOVASCULAR STRESS TEST  03/16/13   Very Poor Exercise Tolerance; NON DIAGNOSTIC TEST  . CATARACT EXTRACTION W/ INTRAOCULAR LENS IMPLANT Left 03/07/2014   [place] @ [facility]  . ESOPHAGOGASTRODUODENOSCOPY Left 11/26/2012   Procedure: ESOPHAGOGASTRODUODENOSCOPY (EGD);   Surgeon: Wonda Horner, MD;  Location: [facility] ENDOSCOPY;  Service: Endoscopy;  Laterality: Left;  . ESOPHAGOGASTRODUODENOSCOPY N/A 10/10/2014   Procedure: ESOPHAGOGASTRODUODENOSCOPY (EGD);  Surgeon: Ladene Artist, MD;  Location[facility] ENDOSCOPY;  Service: Endoscopy;  Laterality: N/A;  . KNEE ARTHROSCOPY Left ~ 1999    Social History: Social History  Substance Use Topics  . Smoking status: Never Smoker  . Smokeless tobacco: Current User    Types: Snuff  . Alcohol use 3.6 oz/week    6 Cans of beer per week   Additional social history: Reports one 40 oz beer a day, denies smoking,   Please also refer to relevant sections of EMR.  Family History: Family History  Problem Relation Age of Onset  . Other Mother        Unsure of medical history.  . Other Father        Unsure of medical history.   Father: Diabetes  Allergies and Medications: Allergies  Allergen Reactions  . Lisinopril Other (See Comments)    Pt reports nose bleed.   No current facility-administered medications on file prior to encounter.    Current Outpatient Prescriptions on File Prior to Encounter  Medication Sig Dispense Refill  . aspirin EC 81 MG tablet Take 81 mg by mouth at bedtime.     [name] atorvastatin (LIPITOR) 80 MG tablet Take 1 tablet (80 mg total) by mouth daily. (Patient taking differently: Take 80 mg by mouth at bedtime. ) 30 tablet 3  . divalproex (DEPAKOTE ER) 500 MG 24 hr tablet Take 500 mg by mouth at bedtime.     . folic acid (FOLVITE) 1 MG tablet Take 1 tablet (1 mg total) by mouth daily. (Patient taking differently: Take 1 mg by mouth at bedtime. ) 30 tablet 12  . gabapentin (NEURONTIN) 300 MG capsule Take 1 capsule (300 mg total) by mouth 3 (three) times daily. (Patient taking differently: Take 300 mg by mouth 3 (three) times daily. Take in the evening) 90 capsule 3  . hydrochlorothiazide (HYDRODIURIL) 25 MG tablet Take 1 tablet (25 mg total) by mouth daily. (Patient taking differently: Take 25 mg by  mouth at bedtime. ) 45 tablet 0  . Insulin Glargine (LANTUS) 100 UNIT/ML Solostar Pen Inject 10 Units into the skin daily at 10 pm. 15 mL 1  . metoprolol (LOPRESSOR) 100 MG tablet TAKE 1 Tablet  BY MOUTH TWICE DAILY (Patient taking differently: TAKE 100 mg Tablet  BY MOUTH TWICE DAILY) 90 tablet 3  . Multiple Vitamin (MULTIVITAMIN WITH MINERALS) TABS tablet Take 1 tablet by mouth at bedtime.     . naproxen (NAPROSYN) 500 MG tablet Take 1 tablet (500 mg total) by mouth 2 (two) times daily with a meal. 60 tablet 0  . olmesartan (BENICAR) 40  MG tablet Take 1 tablet (40 mg total) by mouth daily. (Patient taking differently: Take 40 mg by mouth at bedtime. ) 90 tablet 2  . omeprazole (PRILOSEC) 20 MG capsule Take 1 capsule (20 mg total) by mouth daily. (Patient taking differently: Take 20 mg by mouth 2 (two) times daily before a meal. ) 30 capsule 3  . polyvinyl alcohol (LIQUIFILM TEARS) 1.4 % ophthalmic solution Place 1 drop into both eyes every 4 (four) hours as needed for dry eyes. 15 mL 2  . tadalafil (CIALIS) 10 MG tablet Take 1 tablet (10 mg total) by mouth daily as needed for erectile dysfunction. 10 tablet 0  . thiamine (VITAMIN B-1) 100 MG tablet Take 1 tablet (100 mg total) by mouth daily. (Patient taking differently: Take 100 mg by mouth at bedtime. ) 30 tablet 12  . cyclobenzaprine (FLEXERIL) 10 MG tablet Take 1 tablet (10 mg total) by mouth 3 (three) times daily as needed for muscle spasms. (Patient not taking: Reported on 06/09/2017) 30 tablet 0  . Insulin Glulisine (APIDRA SOLOSTAR) 100 UNIT/ML Solostar Pen Inject 10 Units into the skin daily. With your largest meal of the day. (Patient taking differently: Inject 3-5 Units into the skin daily. With your largest meal of the day.) 15 mL 11  . lactulose (CHRONULAC) 10 GM/15ML solution Take 15 mLs (10 g total) by mouth daily. (Patient not taking: Reported on 06/09/2017) 473 mL 3    Objective: BP (!) 146/74 (BP Location: Right Arm)   Pulse 68    Resp 18   Wt 258 lb (117 kg)   SpO2 99%   BMI 36.50 kg/m  Exam: General: NAD, pleasant Eyes: PERRL, no conjunctival pallor or injection ENTM: Dry mucous membranes, no pharyngeal erythema or exudate Neck: Supple, no JVD Cardiovascular: RRR, no m/r/g, no LE edema Respiratory: CTA BL, normal work of breathing Gastrointestinal: soft, tender in LLQ, nondistended, normoactive BS MSK: moves 4 extremities equally Derm: no rashes appreciated Neuro: CN II-XII grossly intact Psych: alert and oriented, appears    Labs and Imaging: CBC BMET   Recent Labs Lab 06/09/17 2023  WBC 7.5  HGB 11.4*  HCT 33.7*  PLT 295    Recent Labs Lab 06/09/17 2023  NA 119*  K 6.3*  CL 82*  CO2 18*  BUN 29*  CREATININE 3.15*  GLUCOSE 844*  CALCIUM 8.7*     AG: [place], [name], DO 06/09/2017, 11:01 PM PGY-1, [place] Intern pager: [phone], text pages welcome  I have seen and evaluated the patient with Dr. Enid Derry. I am in agreement with the note above in its revised form. My additions are in blue.  Marjie Skiff, MD Family Medicine, PGY-2

## 2017-06-09 NOTE — ED Notes (Signed)
Pharmacy notified on pt.'s insulin drip order.

## 2017-06-09 NOTE — ED Provider Notes (Signed)
[facility] DEPT Provider Note   CSN: [id number] Arrival date & time: 06/09/17  1936     History   Chief Complaint Chief Complaint  Patient presents with  . Hyperglycemia    HPI Jeffrey [name] is a 51 y.o. male.  The history is provided by the patient and medical records.  Hyperglycemia  Associated symptoms: nausea and weakness      51 y.o. M with hx of alcohol abuse, bleeding ulcers, cardiac arrest with anoxic brain injury, GERD, HLP, HTN, OSA on CPAP, DM2, venous insufficiency, presenting to the ED for hyperglycemia.  States about 2 weeks ago he saw his PCP and states his labs looked good so his doctor actually reduced his insulin dosing.  States he was actually going "low" a lot prior to this.  States on Friday he started feeling bad, but this has gotten progressively worse.  States he was trying to correct his sugar on his own over the weekend but did not know how much progress he was making as his home meter kept saying "error" and never gave a number.  States he has felt weak and has practically been in the bed for 4 days.  Reports nausea but denies vomiting.  No fever, chills, urinary symptoms, cough, or other URI symptoms.  States no appetite really.  States he controls most of the cooking in his home and is very carb/sugar conscious of his meals.  Past Medical History:  Diagnosis Date  . Acute renal insufficiency 12/08/2012  . AKI (acute kidney injury) ([place])   . ALCOHOL ABUSE, HX OF 11/10/2007  . Anoxic encephalopathy ([place])   . Anxiety   . Bleeding ulcer 2014  . Blood transfusion 2014   "related to bleeding ulcer"  . Cardiac arrest ([place]) 12/07/2012   Anoxic encephalopathy  . GERD (gastroesophageal reflux disease)   . High cholesterol   . Hypertension   . OSA on CPAP 12/07/2012  . Seizure-like activity ([place])   . Type II diabetes mellitus ([place])   . Venous insufficiency 12/13/2011    Patient Active Problem List   Diagnosis Date Noted  . Hyponatremia 04/08/2017  .  Seizure disorder ([place]) 04/08/2017  . Depression 12/17/2015  . Hip pain 12/17/2015  . Proteinuria 11/23/2015  . Loss of teeth 11/23/2015  . Diabetic polyneuropathy associated with type 2 diabetes mellitus ([place]) 08/04/2015  . Hepatic cirrhosis ([place]) 03/22/2015  . Ataxia 03/20/2015  . Aphasia   . Elevated liver enzymes   . Dysarthria 03/09/2015  . AKI (acute kidney injury) ([place])   . Alcohol intoxication ([place])   . Erosive esophagitis 10/10/2014  . HLD (hyperlipidemia)   . Passed out 10/08/2014  . Syncope and collapse   . OSA on CPAP 12/28/2012  . Pulmonary edema 12/08/2012  . Cardiac arrest ([place]) 12/07/2012  . Obesity 12/07/2012  . Anoxic brain injury ([place]) 12/07/2012  . GI bleed 11/26/2012  . Venous insufficiency 12/13/2011  . 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 ALCOHOL ABUSE, HX OF 11/10/2007  . ERECTILE DYSFUNCTION 04/14/2007  . Type 2 diabetes mellitus with hemoglobin A1c goal of less than 7.5% ([place]) 12/04/2006  . HYPERCHOLESTEROLEMIA 12/04/2006  . OBESITY, NOS 12/04/2006  . Anxiety state 12/04/2006  . HYPERTENSION, BENIGN SYSTEMIC 12/04/2006  . ARTHRITIS 12/04/2006    Past Surgical History:  Procedure Laterality Date  . CARDIOVASCULAR STRESS TEST  03/16/13   Very Poor Exercise Tolerance; NON DIAGNOSTIC TEST  . CATARACT EXTRACTION W/ INTRAOCULAR LENS IMPLANT Left 03/07/2014  [place] @ [facility]  . ESOPHAGOGASTRODUODENOSCOPY Left 11/26/2012   Procedure: ESOPHAGOGASTRODUODENOSCOPY (EGD);  Surgeon: Graylin Shiver, MD;  Location: [facility] ENDOSCOPY;  Service: Endoscopy;  Laterality: Left;  . ESOPHAGOGASTRODUODENOSCOPY N/A 10/10/2014   Procedure: ESOPHAGOGASTRODUODENOSCOPY (EGD);  Surgeon: Meryl Dare, MD;  Location: [facility] ENDOSCOPY;  Service: Endoscopy;  Laterality: N/A;  . KNEE ARTHROSCOPY Left ~ 1999       Home Medications    Prior to Admission medications   Medication Sig Start Date End Date Taking? Authorizing Provider  aspirin EC 81 MG  tablet Take 81 mg by mouth at bedtime.     [provider]  atorvastatin (LIPITOR) 80 MG tablet Take 1 tablet (80 mg total) by mouth daily. 10/10/16   Moses Manners, MD  blood glucose meter kit and supplies KIT Dispense based on patient and insurance preference. Use up to four times daily as directed. (FOR ICD-9 250.00, 250.01). 09/25/16   Delynn Flavin M, DO  Blood Glucose Monitoring Suppl (RELION CONFIRM GLUCOSE MONITOR) w/Device KIT Use to check your capillary glucose TID 04/08/17   Doreene Eland, MD  cyclobenzaprine (FLEXERIL) 10 MG tablet Take 1 tablet (10 mg total) by mouth 3 (three) times daily as needed for muscle spasms. 03/26/17   Rumley, [place] N, DO  divalproex (DEPAKOTE ER) 500 MG 24 hr tablet Take by mouth daily.    [provider]  folic acid (FOLVITE) 1 MG tablet Take 1 tablet (1 mg total) by mouth daily. 05/27/16   Raliegh Ip, DO  gabapentin (NEURONTIN) 300 MG capsule Take 1 capsule (300 mg total) by mouth 3 (three) times daily. 03/26/17   Rumley, [place] N, DO  glucose blood test strip Use to check capillary glucose TID 04/08/17   Doreene Eland, MD  hydrochlorothiazide (HYDRODIURIL) 25 MG tablet Take 1 tablet (25 mg total) by mouth daily. 05/08/17   Garth Bigness, MD  Insulin Glargine (LANTUS) 100 UNIT/ML Solostar Pen Inject 10 Units into the skin daily at 10 pm. 05/08/17   [place] Manners, MD  Insulin Glulisine (APIDRA SOLOSTAR) 100 UNIT/ML Solostar Pen Inject 10 Units into the skin daily. With your largest meal of the day. Patient taking differently: Inject 3-5 Units into the skin daily. With your largest meal of the day. 10/02/16   Raliegh Ip, DO  Insulin Pen Needle 31G X 8 MM MISC BD UltraFine III Pen Needles. For use with insulin pen device. Inject insulin 6 x daily 01/15/16   Narda Bonds, MD  Insulin Syringes, Disposable, U-100 0.3 ML MISC Use one to inject insulin twice daily 10/18/15   Almon Hercules, MD  lactulose (CHRONULAC)  10 GM/15ML solution Take 15 mLs (10 g total) by mouth daily. 05/08/17   Garth Bigness, MD  metoprolol (LOPRESSOR) 100 MG tablet TAKE 1 Tablet  BY MOUTH TWICE DAILY 12/11/16   Delynn Flavin M, DO  Multiple Vitamin (MULTIVITAMIN WITH MINERALS) TABS tablet Take 1 tablet by mouth daily.    [provider]  naproxen (NAPROSYN) 500 MG tablet Take 1 tablet (500 mg total) by mouth 2 (two) times daily with a meal. 05/05/17   Draper, Ozzie Hoyle, DO  olmesartan (BENICAR) 40 MG tablet Take 1 tablet (40 mg total) by mouth daily. 11/05/16   Raliegh Ip, DO  omeprazole (PRILOSEC) 20 MG capsule Take 1 capsule (20 mg total) by mouth daily. Patient taking differently: Take 20 mg by mouth 2 (two) times daily before a meal.  03/26/17  Rumley, [place] N, DO  polyvinyl alcohol (LIQUIFILM TEARS) 1.4 % ophthalmic solution Place 1 drop into both eyes every 4 (four) hours as needed for dry eyes. Patient not taking: Reported on 10/10/2016 06/19/16   [name], DO  tadalafil (CIALIS) 10 MG tablet Take 1 tablet (10 mg total) by mouth daily as needed for erectile dysfunction. 05/02/17   Sela Hilding, MD  thiamine (VITAMIN B-1) 100 MG tablet Take 1 tablet (100 mg total) by mouth daily. 05/27/16   [name], DO    Family History Family History  Problem Relation Age of Onset  . Other Mother        Unsure of medical history.  . Other Father        Unsure of medical history.    Social History Social History  Substance Use Topics  . Smoking status: Never Smoker  . Smokeless tobacco: Current User    Types: Snuff  . Alcohol use 3.6 oz/week    6 Cans of beer per week     Allergies   Lisinopril   Review of Systems Review of Systems  Gastrointestinal: Positive for nausea.  Neurological: Positive for weakness.  All other systems reviewed and are negative.    Physical Exam Updated Vital Signs Wt 117 kg (258 lb)   BMI 36.50 kg/m   Physical Exam  Constitutional: He is  oriented to person, place, and time. He appears well-developed and well-nourished.  HENT:  Head: Normocephalic and atraumatic.  Mouth/Throat: Oropharynx is clear and moist.  Dry mucous membranes  Eyes: Pupils are equal, round, and reactive to light. Conjunctivae and EOM are normal.  Neck: Normal range of motion.  Cardiovascular: Normal rate, regular rhythm and normal heart sounds.   Pulmonary/Chest: Effort normal and breath sounds normal. No respiratory distress. He has no wheezes.  Abdominal: Soft. Bowel sounds are normal.  Musculoskeletal: Normal range of motion.  Neurological: He is alert and oriented to person, place, and time.  AAOx3, able to give elaborate history, spontaneously moving arms and legs during exam without apparent issue  Skin: Skin is warm and dry.  Psychiatric: He has a normal mood and affect.  Nursing note and vitals reviewed.    ED Treatments / Results  Labs (all labs ordered are listed, but only abnormal results are displayed) Labs Reviewed  BASIC METABOLIC PANEL - Abnormal; Notable for the following:       Result Value   Sodium 119 (*)    Potassium 6.3 (*)    Chloride 82 (*)    CO2 18 (*)    Glucose, Bld 844 (*)    BUN 29 (*)    Creatinine, Ser 3.15 (*)    Calcium 8.7 (*)    GFR calc non Af Amer 21 (*)    GFR calc Af Amer 25 (*)    Anion gap 19 (*)    All other components within normal limits  CBC - Abnormal; Notable for the following:    RBC 3.58 (*)    Hemoglobin 11.4 (*)    HCT 33.7 (*)    RDW 15.9 (*)    All other components within normal limits  URINALYSIS, ROUTINE W REFLEX MICROSCOPIC - Abnormal; Notable for the following:    Glucose, UA >=500 (*)    Hgb urine dipstick SMALL (*)    Ketones, ur 5 (*)    Protein, ur 30 (*)    Bacteria, UA RARE (*)    Squamous Epithelial / LPF 0-5 (*)  All other components within normal limits  CBG MONITORING, ED - Abnormal; Notable for the following:    Glucose-Capillary >600 (*)    All other  components within normal limits  I-STAT CHEM 8, ED - Abnormal; Notable for the following:    Sodium 121 (*)    Potassium 6.0 (*)    Chloride 87 (*)    BUN 42 (*)    Creatinine, Ser 3.10 (*)    Glucose, Bld >700 (*)    Calcium, Ion 0.93 (*)    All other components within normal limits  CBG MONITORING, ED  I-STAT VENOUS BLOOD GAS, ED    EKG  EKG Interpretation None       Radiology No results found.  Procedures Procedures (including critical care time)  CRITICAL CARE Performed by: Larene Pickett   Total critical care time: 45 minutes  Critical care time was exclusive of separately billable procedures and treating other patients.  Critical care was necessary to treat or prevent imminent or life-threatening deterioration.  Critical care was time spent personally by me on the following activities: development of treatment plan with patient and/or surrogate as well as nursing, discussions with consultants, evaluation of patient's response to treatment, examination of patient, obtaining history from patient or surrogate, ordering and performing treatments and interventions, ordering and review of laboratory studies, ordering and review of radiographic studies, pulse oximetry and re-evaluation of patient's condition.   Medications Ordered in ED Medications  dextrose 5 %-0.45 % sodium chloride infusion (not administered)  insulin regular bolus via infusion 0-10 Units (not administered)  insulin regular (NOVOLIN R,HUMULIN R) 100 Units in sodium chloride 0.9 % 100 mL (1 Units/mL) infusion (not administered)  dextrose 50 % solution 25 mL (not administered)  0.9 %  sodium chloride infusion ( Intravenous New Bag/Given 06/09/17 2127)  sodium chloride 0.9 % bolus 1,000 mL (0 mLs Intravenous Stopped 06/09/17 2106)  sodium chloride 0.9 % bolus 1,000 mL (1,000 mLs Intravenous New Bag/Given 06/09/17 2126)     Initial Impression / Assessment and Plan / ED Course  I have reviewed the triage  vital signs and the nursing notes.  Pertinent labs & imaging results that were available during my care of the patient were reviewed by me and considered in my medical decision making (see chart for details).  51 year old male here with hyperglycemia. He had his insulin dosing reduced about 2 weeks ago with his primary care doctor as he was having multiple "lows". States he was doing well until about a few days ago. He has become increasingly weak with nausea and poor appetite.  On exam, no focal neurologic deficits. He appears clinically dry. Initial CBG greater than 600. Patient started on IV fluids. Labs are consistent with DKA. Patient started on glucose stabilizer. He will require admission.  Discussed with family practice teaching service-- they will evaluate in the ED and admit.  Final Clinical Impressions(s) / ED Diagnoses   Final diagnoses:  Type 2 diabetes mellitus with ketoacidosis without coma, with long-term current use of insulin ([place])  AKI (acute kidney injury) [facility])    New Prescriptions New Prescriptions   No medications on file     Larene Pickett, PA-C 06/09/17 2213    Pattricia Boss, MD 06/09/17 [phone]

## 2017-06-09 NOTE — ED Notes (Signed)
Pt given a urinal to obtain urine sample

## 2017-06-10 ENCOUNTER — Encounter (HOSPITAL_COMMUNITY): Payer: Self-pay | Admitting: Family Medicine

## 2017-06-10 DIAGNOSIS — Z794 Long term (current) use of insulin: Secondary | ICD-10-CM

## 2017-06-10 LAB — BASIC METABOLIC PANEL
ANION GAP: 12 (ref 5–15)
Anion gap: 12 (ref 5–15)
Anion gap: 10 (ref 5–15)
Anion gap: 11 (ref 5–15)
BUN: 23 mg/dL — ABNORMAL HIGH (ref 6–20)
BUN: 23 mg/dL — ABNORMAL HIGH (ref 6–20)
BUN: 23 mg/dL — ABNORMAL HIGH (ref 6–20)
BUN: 23 mg/dL — ABNORMAL HIGH (ref 6–20)
CHLORIDE: 95 mmol/L — AB (ref 101–111)
CHLORIDE: 96 mmol/L — AB (ref 101–111)
CHLORIDE: 97 mmol/L — AB (ref 101–111)
CHLORIDE: 99 mmol/L — AB (ref 101–111)
CO2: 20 mmol/L — AB (ref 22–32)
CO2: 24 mmol/L (ref 22–32)
CO2: 25 mmol/L (ref 22–32)
CO2: 25 mmol/L (ref 22–32)
CREATININE: 2.31 mg/dL — AB (ref 0.61–1.24)
CREATININE: 2.12 mg/dL — AB (ref 0.61–1.24)
CREATININE: 2.04 mg/dL — AB (ref 0.61–1.24)
Calcium: 6.9 mg/dL — ABNORMAL LOW (ref 8.9–10.3)
Calcium: 8.4 mg/dL — ABNORMAL LOW (ref 8.9–10.3)
Calcium: 8.1 mg/dL — ABNORMAL LOW (ref 8.9–10.3)
Calcium: 8.3 mg/dL — ABNORMAL LOW (ref 8.9–10.3)
Creatinine, Ser: 2.17 mg/dL — ABNORMAL HIGH (ref 0.61–1.24)
GFR calc Af Amer: 38 mL/min — ABNORMAL LOW (ref >60)
GFR calc Af Amer: 40 mL/min — ABNORMAL LOW (ref >60)
GFR calc Af Amer: 41 mL/min — ABNORMAL LOW (ref >60)
GFR calc non Af Amer: 33 mL/min — ABNORMAL LOW (ref >60)
GFR, EST AFRICAN AMERICAN: 36 mL/min — AB (ref >60)
GFR, EST NON AFRICAN AMERICAN: 31 mL/min — AB (ref >60)
GFR, EST NON AFRICAN AMERICAN: 34 mL/min — AB (ref >60)
GFR, EST NON AFRICAN AMERICAN: 36 mL/min — AB (ref >60)
GLUCOSE: 396 mg/dL — AB (ref 65–99)
Glucose, Bld: 127 mg/dL — ABNORMAL HIGH (ref 65–99)
Glucose, Bld: 72 mg/dL (ref 65–99)
Glucose, Bld: 229 mg/dL — ABNORMAL HIGH (ref 65–99)
POTASSIUM: 4.6 mmol/L (ref 3.5–5.1)
Potassium: 4.2 mmol/L (ref 3.5–5.1)
Potassium: 4 mmol/L (ref 3.5–5.1)
Potassium: 4.6 mmol/L (ref 3.5–5.1)
SODIUM: 133 mmol/L — AB (ref 135–145)
SODIUM: 131 mmol/L — AB (ref 135–145)
SODIUM: 131 mmol/L — AB (ref 135–145)
Sodium: 131 mmol/L — ABNORMAL LOW (ref 135–145)

## 2017-06-10 LAB — GLUCOSE, CAPILLARY
GLUCOSE-CAPILLARY: 219 mg/dL — AB (ref 65–99)
GLUCOSE-CAPILLARY: 223 mg/dL — AB (ref 65–99)
Glucose-Capillary: 216 mg/dL — ABNORMAL HIGH (ref 65–99)

## 2017-06-10 LAB — CBG MONITORING, ED
GLUCOSE-CAPILLARY: 137 mg/dL — AB (ref 65–99)
GLUCOSE-CAPILLARY: 73 mg/dL (ref 65–99)
GLUCOSE-CAPILLARY: 181 mg/dL — AB (ref 65–99)
GLUCOSE-CAPILLARY: 230 mg/dL — AB (ref 65–99)
GLUCOSE-CAPILLARY: 257 mg/dL — AB (ref 65–99)
Glucose-Capillary: 467 mg/dL — ABNORMAL HIGH (ref 65–99)
Glucose-Capillary: 369 mg/dL — ABNORMAL HIGH (ref 65–99)
Glucose-Capillary: 73 mg/dL (ref 65–99)
Glucose-Capillary: 74 mg/dL (ref 65–99)
Glucose-Capillary: 135 mg/dL — ABNORMAL HIGH (ref 65–99)

## 2017-06-10 LAB — I-STAT VENOUS BLOOD GAS, ED
ACID-BASE DEFICIT: 3 mmol/L — AB (ref 0.0–2.0)
BICARBONATE: 23.3 mmol/L (ref 20.0–28.0)
O2 Saturation: 23 %
PO2 VEN: 18 mmHg — AB (ref 32.0–45.0)
TCO2: 25 mmol/L (ref 22–32)
pCO2, Ven: 46.2 mmHg (ref 44.0–60.0)
pH, Ven: 7.311 (ref 7.250–7.430)

## 2017-06-10 LAB — CBC
HEMATOCRIT: 31.3 % — AB (ref 39.0–52.0)
HEMOGLOBIN: 10.6 g/dL — AB (ref 13.0–17.0)
MCH: 31 pg (ref 26.0–34.0)
MCHC: 33.9 g/dL (ref 30.0–36.0)
MCV: 91.5 fL (ref 78.0–100.0)
Platelets: 276 10*3/uL (ref 150–400)
RBC: 3.42 MIL/uL — ABNORMAL LOW (ref 4.22–5.81)
RDW: 15.8 % — AB (ref 11.5–15.5)
WBC: 7.8 10*3/uL (ref 4.0–10.5)

## 2017-06-10 LAB — HIV ANTIBODY (ROUTINE TESTING W REFLEX): HIV Screen 4th Generation wRfx: NONREACTIVE

## 2017-06-10 LAB — HEMOGLOBIN A1C
Hgb A1c MFr Bld: 12.6 % — ABNORMAL HIGH (ref 4.8–5.6)
MEAN PLASMA GLUCOSE: 315 mg/dL

## 2017-06-10 MED ORDER — INSULIN GLARGINE 100 UNIT/ML ~~LOC~~ SOLN
8.0000 [IU] | Freq: Every day | SUBCUTANEOUS | Status: DC
  Administered 2017-06-10: 8 [IU] via SUBCUTANEOUS
  Filled 2017-06-10 (×2): qty 0.08

## 2017-06-10 MED ORDER — INSULIN GLARGINE 100 UNIT/ML ~~LOC~~ SOLN: 8.0000 [IU] | Freq: Every day | SUBCUTANEOUS | Status: DC

## 2017-06-10 MED ORDER — ENOXAPARIN SODIUM 40 MG/0.4ML ~~LOC~~ SOLN
40.0000 mg | SUBCUTANEOUS | Status: DC
Start: 2017-06-10 — End: 2017-06-12
  Administered 2017-06-10 – 2017-06-12 (×3): 40 mg via SUBCUTANEOUS
  Filled 2017-06-10 (×4): qty 0.4

## 2017-06-10 MED ORDER — ONDANSETRON HCL 4 MG PO TABS: 4.0000 mg | ORAL_TABLET | Freq: Four times a day (QID) | ORAL | Status: DC | PRN

## 2017-06-10 MED ORDER — SODIUM CHLORIDE 0.9 % IV SOLN: INTRAVENOUS | Status: AC

## 2017-06-10 MED ORDER — ENSURE ENLIVE PO LIQD: 237.0000 mL | Freq: Two times a day (BID) | ORAL | Status: DC

## 2017-06-10 MED ORDER — ACETAMINOPHEN 650 MG RE SUPP: 650.0000 mg | Freq: Four times a day (QID) | RECTAL | Status: DC | PRN

## 2017-06-10 MED ORDER — FOLIC ACID 1 MG PO TABS
1.0000 mg | ORAL_TABLET | Freq: Every day | ORAL | Status: DC
  Administered 2017-06-10 – 2017-06-12 (×3): 1 mg via ORAL
  Filled 2017-06-10 (×3): qty 1

## 2017-06-10 MED ORDER — ACETAMINOPHEN 325 MG PO TABS
650.0000 mg | ORAL_TABLET | Freq: Four times a day (QID) | ORAL | Status: DC | PRN
Start: 2017-06-10 — End: 2017-06-12

## 2017-06-10 MED ORDER — DIVALPROEX SODIUM ER 500 MG PO TB24
500.0000 mg | ORAL_TABLET | Freq: Every day | ORAL | Status: DC
  Administered 2017-06-10 – 2017-06-11 (×2): 500 mg via ORAL
  Filled 2017-06-10 (×4): qty 1

## 2017-06-10 MED ORDER — LORAZEPAM 2 MG/ML IJ SOLN: 2.0000 mg | INTRAMUSCULAR | Status: DC | PRN

## 2017-06-10 MED ORDER — PNEUMOCOCCAL VAC POLYVALENT 25 MCG/0.5ML IJ INJ
0.5000 mL | INJECTION | INTRAMUSCULAR | Status: AC
Start: 2017-06-11 — End: 2017-06-11
  Administered 2017-06-11: 0.5 mL via INTRAMUSCULAR
  Filled 2017-06-10: qty 0.5

## 2017-06-10 MED ORDER — ASPIRIN EC 81 MG PO TBEC
81.0000 mg | DELAYED_RELEASE_TABLET | Freq: Every day | ORAL | Status: DC
  Administered 2017-06-10 – 2017-06-11 (×2): 81 mg via ORAL
  Filled 2017-06-10 (×2): qty 1

## 2017-06-10 MED ORDER — METOPROLOL TARTRATE 100 MG PO TABS
100.0000 mg | ORAL_TABLET | Freq: Two times a day (BID) | ORAL | Status: DC
  Administered 2017-06-10 – 2017-06-12 (×5): 100 mg via ORAL
  Filled 2017-06-10 (×3): qty 1
  Filled 2017-06-10: qty 4
  Filled 2017-06-10: qty 1

## 2017-06-10 MED ORDER — ATORVASTATIN CALCIUM 80 MG PO TABS
80.0000 mg | ORAL_TABLET | Freq: Every day | ORAL | Status: DC
  Administered 2017-06-10 – 2017-06-12 (×3): 80 mg via ORAL
  Filled 2017-06-10 (×4): qty 1

## 2017-06-10 MED ORDER — INSULIN ASPART 100 UNIT/ML ~~LOC~~ SOLN
0.0000 [IU] | Freq: Three times a day (TID) | SUBCUTANEOUS | Status: DC
  Administered 2017-06-10 (×2): 3 [IU] via SUBCUTANEOUS

## 2017-06-10 MED ORDER — VITAMIN B-1 100 MG PO TABS
100.0000 mg | ORAL_TABLET | Freq: Every day | ORAL | Status: DC
  Administered 2017-06-10 – 2017-06-12 (×3): 100 mg via ORAL
  Filled 2017-06-10 (×3): qty 1

## 2017-06-10 MED ORDER — GABAPENTIN 300 MG PO CAPS
300.0000 mg | ORAL_CAPSULE | Freq: Three times a day (TID) | ORAL | Status: DC
  Administered 2017-06-10 – 2017-06-12 (×6): 300 mg via ORAL
  Filled 2017-06-10 (×6): qty 1

## 2017-06-10 MED ORDER — ONDANSETRON HCL 4 MG/2ML IJ SOLN: 4.0000 mg | Freq: Four times a day (QID) | INTRAMUSCULAR | Status: DC | PRN

## 2017-06-10 MED ORDER — PANTOPRAZOLE SODIUM 40 MG PO TBEC
40.0000 mg | DELAYED_RELEASE_TABLET | Freq: Every day | ORAL | Status: DC
  Administered 2017-06-10 – 2017-06-12 (×3): 40 mg via ORAL
  Filled 2017-06-10 (×3): qty 1

## 2017-06-10 MED ORDER — POLYVINYL ALCOHOL 1.4 % OP SOLN
1.0000 [drp] | OPHTHALMIC | Status: DC | PRN
  Filled 2017-06-10: qty 15

## 2017-06-10 NOTE — Discharge Summary (Signed)
[facility] Discharge Summary  Patient name: Jeffrey [name] Medical record number: [id number] Date of birth: 05-17-1966 Age: 51 y.o. Gender: male Date of Admission: 06/09/2017  Date of Discharge: 06/12/17 Admitting Physician: Leeanne Rio, MD  Primary Care Provider: Sela Hilding, MD Consultants: none  Indication for Hospitalization: fatigue and weakness  Discharge Diagnoses/Problem List:  Poorly controlled DM-II OSA HTN AKI Alcohol Abuse Hepatic Cirrhosis History of Seizure disorder GERD Hyperlipidemia Chronic Vision Changes  Disposition: home  Discharge Condition: stable  Discharge Exam:  General: NAD, pleasant obese male Cardiovascular: RRR, no m/r/g, no LE edema Respiratory: CTA BL, normal work of breathing Gastrointestinal: soft, tender in LLQ, nondistended Extremities: No BLE edema, moves 4 extremities equally  Brief Hospital Course:   Patient presented with fatigue and weakness for 3 days. Patient reported poor po intake during those 3 days. On admission, initial BMP showed Glucose>844, Na+ 119, K+ 6.3, AG 19, Bicarb of 18 consistent with DKA. Patient reported recent decrease of Lantus from 20 U to 10 U. He was also supposed to be taking Apidra 3-5 U daily but denied doing so. A1c 12.7 on 03/26/2017. UA with glucose > 500, protein 30 &ketones 5. WBC 7.5 making infectious process less likely. Episode of DKA was likely secondary to medication non adherence in the setting of recent regimen change. He was given 2L of NS in ED and started on gluco-stabilizers.   He was then transitioned to sub cutaneous insulin with carb modified diet. A1c here was 12.6 on 06/10/2017. While admitted adjustments were made to his diabetic regimen, as well as extensive education provided.    Issues for Follow Up:  1. T2DM: Will need glucose check at follow up, ensure patient has a new monitor and strips. Patient instructed to check glucose at home and  bring values to appointment. May need NPH dose adjustment.  2. Proteinuria: Continue working on outpatient referral to nephrology  3. Hematuria: Suggest follow up labs, and possible outpatient urology referral  4. Alcohol Cessation: Will need encouragement on continuing to stop alcohol use and accepting help.  5. History of Seizures: Patient would like follow up with his neurologist Dr. [name]. 6. AKI: Resolved during admission, suggest a repeat BMP to continue to monitor as patient has recent history of AKI due to poor intake at home.  7. Hypertension: Continued home metoprolol, olmesartan and HCTZ. Encourage compliance with medications. 8. OSA: Patient reports compliance with CPAP at home, however refused while admitted. Please encourage use of CPAP nightly.  Significant Procedures: none  Significant Labs and Imaging:   Recent Labs Lab 06/10/17 0356 06/11/17 0536 06/12/17 0523  WBC 7.8 6.0 6.7  HGB 10.6* 9.2* 10.7*  HCT 31.3* 27.9* 31.6*  PLT 276 222 230    Recent Labs Lab 06/10/17 0356 06/10/17 0712 06/10/17 1016 06/11/17 0536 06/12/17 0523  NA 133* 131* 131* 132* 134*  K 4.2 4.0 4.6 4.4 4.2  CL 97* 96* 95* 100* 98*  CO2 24 25 25 24 24   GLUCOSE 127* 72 229* 275* 220*  BUN 23* 23* 23* 15 12  CREATININE 2.31* 2.12* 2.04* 1.43* 1.16  CALCIUM 8.4* 8.1* 8.3* 8.1* 9.0   A1c: 12.7  Results/Tests Pending at Time of Discharge:   Discharge Medications:  Allergies as of 06/12/2017      Reactions   Lisinopril Other (See Comments)   Pt reports nose bleed.      Medication List    TAKE these medications   aspirin EC 81  MG tablet Take 81 mg by mouth at bedtime.   atorvastatin 80 MG tablet Commonly known as:  LIPITOR Take 1 tablet (80 mg total) by mouth daily. What changed:  when to take this   cyclobenzaprine 10 MG tablet Commonly known as:  FLEXERIL Take 1 tablet (10 mg total) by mouth 3 (three) times daily as needed for muscle spasms.   divalproex 500 MG 24 hr  tablet Commonly known as:  DEPAKOTE ER Take 500 mg by mouth at bedtime.   feeding supplement (GLUCERNA SHAKE) Liqd Take 237 mLs by mouth 2 (two) times daily between meals.   folic acid 1 MG tablet Commonly known as:  FOLVITE Take 1 tablet (1 mg total) by mouth daily. What changed:  when to take this   gabapentin 300 MG capsule Commonly known as:  NEURONTIN Take 1 capsule (300 mg total) by mouth 3 (three) times daily. What changed:  additional instructions   hydrochlorothiazide 25 MG tablet Commonly known as:  HYDRODIURIL Take 1 tablet (25 mg total) by mouth daily. What changed:  when to take this   Insulin Glargine 100 UNIT/ML Solostar Pen Commonly known as:  LANTUS Inject 13 Units into the skin daily before breakfast. What changed:  how much to take  when to take this   Insulin Glulisine 100 UNIT/ML Solostar Pen Commonly known as:  APIDRA SOLOSTAR Take 4 units with breakfast and dinner (your two largest meals) What changed:  how much to take  how to take this  when to take this  additional instructions   lactulose 10 GM/15ML solution Commonly known as:  CHRONULAC Take 15 mLs (10 g total) by mouth daily.   metoprolol tartrate 100 MG tablet Commonly known as:  LOPRESSOR TAKE 1 Tablet  BY MOUTH TWICE DAILY What changed:  See the new instructions.   multivitamin with minerals Tabs tablet Take 1 tablet by mouth at bedtime.   naproxen 500 MG tablet Commonly known as:  NAPROSYN Take 1 tablet (500 mg total) by mouth 2 (two) times daily with a meal.   olmesartan 40 MG tablet Commonly known as:  BENICAR Take 1 tablet (40 mg total) by mouth daily. What changed:  when to take this   omeprazole 20 MG capsule Commonly known as:  PRILOSEC Take 1 capsule (20 mg total) by mouth daily. What changed:  when to take this   polyvinyl alcohol 1.4 % ophthalmic solution Commonly known as:  LIQUIFILM TEARS Place 1 drop into both eyes every 4 (four) hours as needed for  dry eyes.   tadalafil 10 MG tablet Commonly known as:  CIALIS Take 1 tablet (10 mg total) by mouth daily as needed for erectile dysfunction.   thiamine 100 MG tablet Commonly known as:  VITAMIN B-1 Take 1 tablet (100 mg total) by mouth daily. What changed:  when to take this            Discharge Care Instructions        Start     Ordered   06/13/17 0000  Insulin Glargine (LANTUS) 100 UNIT/ML Solostar Pen  Daily before breakfast    Question Answer Comment  Lot Number? error   Expiration Date? 05/08/2017   Quantity 0      06/12/17 1240   06/12/17 0000  feeding supplement, GLUCERNA SHAKE, (GLUCERNA SHAKE) LIQD  2 times daily between meals     06/12/17 1240   06/12/17 0000  Insulin Glulisine (APIDRA SOLOSTAR) 100 UNIT/ML Solostar Pen    Comments:  Please call if this medication is not a 1:1 conversion with Novolog.   06/12/17 1240   06/12/17 0000  lactulose (CHRONULAC) 10 GM/15ML solution  Daily     06/12/17 1240      Discharge Instructions: Please refer to Patient Instructions section of EMR for full details.  Patient was counseled important signs and symptoms that should prompt return to medical care, changes in medications, dietary instructions, activity restrictions, and follow up appointments.   Follow-Up Appointments: Follow-up [facility] Follow up on 06/16/2017.   Why:  Please follow up with Dr. Valentina Lucks for diabetes on 06/16/17 at 9:45. Please arrive 15 minutes before your appointment  Contact information: [place]          Jillann Charette, [name], [place] 06/12/2017, 1:05 PM PGY-1, [place]

## 2017-06-10 NOTE — ED Notes (Signed)
Gave pt 8Fl oz of apple juice

## 2017-06-10 NOTE — Progress Notes (Signed)
Patient refused CPAP for tonight 

## 2017-06-10 NOTE — Progress Notes (Signed)
Inpatient Diabetes Program Recommendations  AACE/ADA: New Consensus Statement on Inpatient Glycemic Control (2015)  Target Ranges:  Prepandial:   less than 140 mg/dL      Peak postprandial:   less than 180 mg/dL (1-2 hours)      Critically ill patients:  140 - 180 mg/dL   Spoke with patient about diabetes and home regimen for diabetes control. Patient reports that he is followed by PCP at the [facility] for diabetes management and currently takes Lantus and Apidra for diabetes control. Patient reports that he takes his medication consistently and has recently had medication adjustments to decrease his insulin due to many hypoglycemic episodes. Patient reports being scared to take the Apidra dosing. Patient reports eating a good breakfast and not having much of an appetite any other time. Encouraged patient to have consistent intake for the body's fuel. Spoke with patient about his renal function and holding onto insulin as well. Spoke with patient about his current A1c. Discussed A1c and glucose goals going forward. Stressed to the patient the importance of improving glycemic control to prevent further complications from uncontrolled diabetes. Discussed impact of nutrition, exercise, stress, sickness, and medications on diabetes control. Discussed carbohydrates, carbohydrate goals per day and meal, along with portion sizes. Patient verbalized understanding of information discussed and he states that he has no further questions at this time related to diabetes.   Thanks,  Tama Headings RN, MSN, [facility] Inpatient Diabetes Coordinator Team Pager [phone] (8a-5p)

## 2017-06-10 NOTE — Progress Notes (Signed)
Family Medicine Teaching Service Daily Progress Note Intern Pager: [phone]  Patient name: Jeffrey [name] Medical record number: [id number] Date of birth: 11-Apr-1966 Age: 51 y.o. Gender: male  Primary Care Provider: Sela Hilding, MD Consultants: none Code Status: Full  Pt Overview and Major Events to Date:  Jeffrey [name] is a 51 y.o. male presenting with fatigue and weakness and found to have severe hyperglycemia with other labs findings consistent with DKA . PMH is significant for DM II, obesity, alcohol abuse, cardiac arrest, HTN, HLD, anxiety and OSA.  Assessment and Plan:  DKA/poorly controlled DM-II On admission, initial BMP showed Glucose>844, Na+ 119, K+ 6.3, AG 19, Bicarb of 18 consistent with DKA. Patient reports recent decrease of Lantus from 20 U to 10 U. He also was supposed to be taking Apidra 3-5 U daily but denied doing so. A1c 12.7 on 03/26/2017. Current episode likely secondary to medication non adherence in the setting of recent regimen change. AG now 10.  - IVF and Insulin per protocol for DKA.  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 On admission, Cr 3.15> 2.12 s/p IVF (Baseline ~1.2). Elevated creatinine is likely due to uncontrolled T2DM with some early diabetic nephropathy contributing to worsening renal function. Has an outpatient nephrology consult pending. - Continue IVF per protocol - Held nephrotoxic agents (HCTZ, omersartan) - Trend BMP - Cr 3.15 on 9/03> 2.12 on 9/04  Hypertension: Stable, last BP 156/86. Home regimen include on HCTZ 25 mg, Lopressor 100 mg BID and olmesartan 40 mg daily. On admission, BP159/93. - Continue Metoprolol  100 mg bid - Holding Omersartan and HCTZ w/ AKI, resume when appropriate   Obstructive Sleep Apnea: Reports compliance with CPAP at home - Will order CPAP nightly, not on last night in ED.   Abdominal Pain: New onset left lower quadrant abdominal pain, tender to palpation. Likely secondary to initial DKA. -Continue to monitor symptoms  Alcohol abuse, chronic, uncontrol: Previous history of abuse of EtOH. Admits to drinking a 40 oz beer daily at least 4 times a week. - CIWA when on floor - Folate and thiamine  - social work consult  Hepatic Cirrhosis, stable: Patient does not show any stigmata of HE, mentation at baseline, no asterixis noted on exam.  Likely secondary to alcoholism. HCV and HBV negative last year. [name] on 03/09/2015 with hepatic steatosis -Hold home lactulose in the setting of dehydration, will resume with improving volume status and resolving DKA.  History of Seizure disorder, chronic, well controlled: Stable on Depakote. No recent seizure. - Continue home dose  GERD: Stable, on Prilosec. - Continue home medication for now  Hyperlipidemia: Stable. Continue home meds - Continue atorvastatin and ASA  Vision Changes: Likely secondary to diabetic retinopathy --Continue Polyvinyl alcohol ophthalmic solution  FEN/GI: Slow transition to carb modified diet once on floor Glucomander Replete as needed  Prophylaxis: SubQ Heparin  Disposition:med-surg pending sub q transition  Subjective:  Patient reports feeling better this am and wanting to try to eat bc he says he has not eaten in 5 days. Has some back pain due to the uncomfortable bed.  Objective: Pulse Rate:  [52-96] 80 (  09/04 0902) Resp:  [12-22] 12 (09/04 0902) BP: (103-160)/(67-103) 156/86 (09/04 0902) SpO2:  [97 %-100 %] 97 % (09/04 0902) Weight:  [258 lb (117 kg)] 258 lb (117 kg) (09/03 1941) Physical Exam: General: NAD, pleasant obese male Cardiovascular: RRR, no m/r/g, no LE  edema Respiratory: CTA BL, normal work of breathing Gastrointestinal: soft, tender in LLQ, nondistended Extremities: No BLE edema, moves 4 extremities equally  Laboratory:  Recent Labs Lab 06/09/17 2008 06/09/17 2023 06/10/17 0356  WBC  --  7.5 7.8  HGB 13.6 11.4* 10.6*  HCT 40.0 33.7* 31.3*  PLT  --  295 276    Recent Labs Lab 06/10/17 0044 06/10/17 0356 06/10/17 0712  NA 131* 133* 131*  K 4.6 4.2 4.0  CL 99* 97* 96*  CO2 20* 24 25  BUN 23* 23* 23*  CREATININE 2.17* 2.31* 2.12*  CALCIUM 6.9* 8.4* 8.1*  GLUCOSE 396* 127* 72   AG 10  Imaging/Diagnostic Tests: No results found.  Patryck Kilgore, [name], DO 06/10/2017, 9:55 AM PGY-1, [place] Intern pager: [phone], text pages welcome

## 2017-06-10 NOTE — ED Notes (Signed)
Attempted report x1. 

## 2017-06-10 NOTE — Discharge Planning (Signed)
CM consult regarding medication assistance. [facility] will be a Microbiologist for pt at discharge.

## 2017-06-10 NOTE — Care Management Note (Addendum)
Case Management Note  Patient Details  Name: MICKELL BIRDWELL MRN: [id number] Date of Birth: [birth year]  Subjective/Objective:   Pt admitted with DKA                 Action/Plan:  PTA from home.  Pt is active with the [facility] and CM verified with [facility] that pt has both medication assistance and his orange card is active until 10/18/2017.  The liaison with the [facility] informed CM that the pt still has not picked up his  Prescriptions (including Lantus and Apidra) from August - last pick up date was in July and before that was March - CM informed attending.     Expected Discharge Date:   (unsured)               Expected Discharge Plan:  Home/Self Care  In-House Referral:  NA  Discharge planning Services  CM Consult, Medication Assistance  Post Acute Care Choice:    Choice offered to:     DME Arranged:    DME Agency:     HH Arranged:    HH Agency:     Status of Service:  In process, will continue to follow  If discussed at Long Length of Stay Meetings, dates discussed:    Additional Comments:  Maryclare Labrador, RN 06/10/2017, 2:02 PM

## 2017-06-11 LAB — CBC
HEMATOCRIT: 27.9 % — AB (ref 39.0–52.0)
HEMOGLOBIN: 9.2 g/dL — AB (ref 13.0–17.0)
MCH: 30.5 pg (ref 26.0–34.0)
MCHC: 33 g/dL (ref 30.0–36.0)
MCV: 92.4 fL (ref 78.0–100.0)
Platelets: 222 10*3/uL (ref 150–400)
RBC: 3.02 MIL/uL — ABNORMAL LOW (ref 4.22–5.81)
RDW: 15.8 % — ABNORMAL HIGH (ref 11.5–15.5)
WBC: 6 10*3/uL (ref 4.0–10.5)

## 2017-06-11 LAB — BASIC METABOLIC PANEL
ANION GAP: 8 (ref 5–15)
BUN: 15 mg/dL (ref 6–20)
CHLORIDE: 100 mmol/L — AB (ref 101–111)
CO2: 24 mmol/L (ref 22–32)
Calcium: 8.1 mg/dL — ABNORMAL LOW (ref 8.9–10.3)
Creatinine, Ser: 1.43 mg/dL — ABNORMAL HIGH (ref 0.61–1.24)
GFR calc non Af Amer: 55 mL/min — ABNORMAL LOW (ref >60)
Glucose, Bld: 275 mg/dL — ABNORMAL HIGH (ref 65–99)
Potassium: 4.4 mmol/L (ref 3.5–5.1)
Sodium: 132 mmol/L — ABNORMAL LOW (ref 135–145)

## 2017-06-11 LAB — GLUCOSE, CAPILLARY
GLUCOSE-CAPILLARY: 325 mg/dL — AB (ref 65–99)
Glucose-Capillary: 232 mg/dL — ABNORMAL HIGH (ref 65–99)
Glucose-Capillary: 326 mg/dL — ABNORMAL HIGH (ref 65–99)
Glucose-Capillary: 271 mg/dL — ABNORMAL HIGH (ref 65–99)

## 2017-06-11 MED ORDER — GLUCERNA SHAKE PO LIQD
237.0000 mL | Freq: Three times a day (TID) | ORAL | Status: DC
Start: 2017-06-11 — End: 2017-06-12
  Administered 2017-06-11 – 2017-06-12 (×4): 237 mL via ORAL
  Filled 2017-06-11: qty 237

## 2017-06-11 MED ORDER — INSULIN ASPART 100 UNIT/ML ~~LOC~~ SOLN: 4.0000 [IU] | Freq: Two times a day (BID) | SUBCUTANEOUS | Status: DC

## 2017-06-11 MED ORDER — INSULIN ASPART 100 UNIT/ML ~~LOC~~ SOLN
0.0000 [IU] | Freq: Three times a day (TID) | SUBCUTANEOUS | Status: DC
  Administered 2017-06-11: 7 [IU] via SUBCUTANEOUS
  Administered 2017-06-12: 2 [IU] via SUBCUTANEOUS
  Administered 2017-06-12: 3 [IU] via SUBCUTANEOUS

## 2017-06-11 MED ORDER — IRBESARTAN 300 MG PO TABS
300.0000 mg | ORAL_TABLET | Freq: Every day | ORAL | Status: DC
  Administered 2017-06-11 – 2017-06-12 (×2): 300 mg via ORAL
  Filled 2017-06-11 (×2): qty 1

## 2017-06-11 MED ORDER — INSULIN GLARGINE 100 UNIT/ML ~~LOC~~ SOLN
13.0000 [IU] | Freq: Every day | SUBCUTANEOUS | Status: DC
  Administered 2017-06-12: 13 [IU] via SUBCUTANEOUS
  Filled 2017-06-11: qty 0.13

## 2017-06-11 MED ORDER — INSULIN GLARGINE 100 UNIT/ML ~~LOC~~ SOLN
10.0000 [IU] | Freq: Every day | SUBCUTANEOUS | Status: DC
  Administered 2017-06-11: 10 [IU] via SUBCUTANEOUS
  Filled 2017-06-11: qty 0.1

## 2017-06-11 MED ORDER — INSULIN ASPART 100 UNIT/ML ~~LOC~~ SOLN
4.0000 [IU] | Freq: Two times a day (BID) | SUBCUTANEOUS | Status: DC
  Administered 2017-06-11: 4 [IU] via SUBCUTANEOUS

## 2017-06-11 MED ORDER — INSULIN ASPART 100 UNIT/ML ~~LOC~~ SOLN
4.0000 [IU] | Freq: Two times a day (BID) | SUBCUTANEOUS | Status: DC
  Administered 2017-06-11 – 2017-06-12 (×2): 4 [IU] via SUBCUTANEOUS

## 2017-06-11 MED ORDER — HYDROCHLOROTHIAZIDE 25 MG PO TABS
25.0000 mg | ORAL_TABLET | Freq: Every day | ORAL | Status: DC
  Administered 2017-06-11: 25 mg via ORAL
  Filled 2017-06-11: qty 1

## 2017-06-11 NOTE — Progress Notes (Signed)
Family Medicine Teaching Service Daily Progress Note Intern Pager: [phone]  Patient name: Jeffrey [name] Medical record number: [id number] Date of birth: 1965-10-27 Age: 51 y.o. Gender: male  Primary Care Provider: Sela Hilding, MD Consultants: none Code Status: Full  Pt Overview and Major Events to Date:  Jeffrey [name] is a 51 y.o. male presenting with fatigue and weakness and found to have severe hyperglycemia with other labs findings consistent with DKA . PMH is significant for DM II, obesity, alcohol abuse, cardiac arrest, HTN, HLD, anxiety and OSA.  Assessment and Plan:  DKA, resolved/poorly controlled DM-II: On admission, initial BMP showed Glucose>844, Na+ 119, K+ 6.3, AG 19, Bicarb of 18 consistent with DKA.  A1c 12.6 on 06/10/2017. Current episode likely secondary to medication non adherence in the setting of recent regimen change. Now on sub q meds and eating well.  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 Goal is for patient to feel comfortable on what he will be going with in terms of insulin.  - nutrition consult in place for patient reporting poor eating habits at home  AKI, improving: Likely from dehydration in the setting of hyperglycemia and DKA. Elevated creatinine is likely due to uncontrolled T2DM with some early diabetic nephropathy contributing to worsening renal function. Has an outpatient nephrology consult pending. - D/c IVF - Restarting home ARB and HCTZ with resolution of AKI - Trend BMP - Cr 3.15 on 9/03> 1.43 on 9/05  Hypertension: Stable, last BP 156/86. Home regimen include on HCTZ 25 mg, Lopressor 100 mg BID and olmesartan 40 mg daily. On admission, BP159/93. - Continue Metoprolol 100 mg bid -  Restarting irbesartan 300 mg while admitted, and HCTZ 25 mg given resolution of AKI and hypertension  Obstructive Sleep Apnea: Reports compliance with CPAP at home - Will order CPAP nightly, refused last night  Abdominal Pain: Resolved. New onset left lower quadrant abdominal pain, tender to palpation. Likely secondary to initial DKA. -Continue to monitor symptoms  Anemia: Hgb on admit 13.6> 9.2 on 9/5 - Monitor at follow up  Alcohol abuse, chronic, uncontrol: Previous history of abuse of EtOH. Admits to drinking a 40 oz beer daily at least 4 times a week. - CIWA discontinued  - Folate and thiamine  - social work consult  Hepatic Cirrhosis, stable: Patient does not show any stigmata of HE, mentation at baseline, no asterixis noted on exam.  Likely secondary to alcoholism. HCV and HBV negative last year. [name] on 03/09/2015 with hepatic steatosis -Hold home lactulose in the setting of dehydration, will resume with improving volume status and resolved DKA.  History of Seizure disorder, chronic, well controlled: Stable on Depakote. No recent seizure. - Continue home dose  GERD: Stable, on Prilosec. - Continue home medication for now  Hyperlipidemia: Stable. Continue home meds - Continue atorvastatin and ASA  Vision Changes: Likely secondary to diabetic retinopathy --Continue Polyvinyl alcohol ophthalmic solution  FEN/GI: Slow transition to carb modified diet once on floor Glucomander Replete as needed  Prophylaxis: SubQ Heparin  Disposition:med-surg pending sub q transition  Subjective:  Patient reports feeling better this am and is eating well. He is worried about meal coverage, but we reached an agreement on 4 units  at breakfast and dinner. He will receive that here today to ensure he does not drop too low in hopes that he will improve.   Objective: Temp:  [97.8 F (36.6 C)-98.4 F (36.9 C)] 98.3 F (36.8 C) (09/05 0331) Pulse Rate:  [52-94] 75 (09/05  0331) Resp:  [9-29] 15 (09/05 0331) BP: (118-158)/(67-107) 152/90 (09/05 0331) SpO2:  [97 %-100 %] 100 % (09/05 0331) Weight:  [258 lb 2.5 oz (117.1 kg)] 258 lb 2.5 oz (117.1 kg) (09/04 1323) Physical Exam: General: NAD, pleasant obese male Cardiovascular: RRR, no m/r/g, no LE edema Respiratory: CTA BL, normal work of breathing Gastrointestinal: soft, tender in LLQ, nondistended Extremities: No BLE edema, moves 4 extremities equally  Laboratory:  Recent Labs Lab 06/09/17 2023 06/10/17 0356 06/11/17 0536  WBC 7.5 7.8 6.0  HGB 11.4* 10.6* 9.2*  HCT 33.7* 31.3* 27.9*  PLT 295 276 222    Recent Labs Lab 06/10/17 0356 06/10/17 0712 06/10/17 1016  NA 133* 131* 131*  K 4.2 4.0 4.6  CL 97* 96* 95*  CO2 24 25 25   BUN 23* 23* 23*  CREATININE 2.31* 2.12* 2.04*  CALCIUM 8.4* 8.1* 8.3*  GLUCOSE 127* 72 229*    Imaging/Diagnostic Tests: No results found.  Jeffrey [name], [name], DO 06/11/2017, 7:10 AM PGY-1, [place] Intern pager: [phone], text pages welcome

## 2017-06-11 NOTE — Progress Notes (Signed)
Results for [name], [name] (MRN [id number]) as of 06/11/2017 11:54  Ref. Range 06/10/2017 12:05 06/10/2017 13:19 06/10/2017 17:05 06/10/2017 22:03 06/11/2017 07:48  Glucose-Capillary Latest Ref Range: 65 - 99 mg/dL 257 (H) 219 (H) 216 (H) 223 (H) 232 (H)  Noted that CBGs continue to be greater than 180 mg/dl.  Recommend adding Novolog SENSITIVE correction scale TID & HS if CBGs continue to be elevated. Continue current insulin orders as written. Will continue to monitor blood sugars while in the hospital.  Harvel Ricks RN BSN CDE Diabetes Coordinator Pager: [phone]  8am-5pm

## 2017-06-11 NOTE — Progress Notes (Signed)
Initial Nutrition Assessment  DOCUMENTATION CODES:   Obesity unspecified  INTERVENTION:  - Continue Glucerna Shake po TID, each supplement provides 220 kcal and 10 grams of protein  NUTRITION DIAGNOSIS:   Inadequate oral intake related to poor appetite as evidenced by per patient/family report.  GOAL:   Patient will meet greater than or equal to 90% of their needs  MONITOR:   PO intake, Supplement acceptance, Weight trends, I & O's  REASON FOR ASSESSMENT:   Malnutrition Screening Tool    ASSESSMENT:   Pt with PMH of HTN, HLD, GERD, Type II DM, alcohol abuse, presents with fatigue and weakness found to have DKA   Pt reports recent intentional weight loss however "lost too much and trying to gain some back". Per chart review pt's weight seems to be trending up over the past year.  Pt reports shopping and cooking for his household and reports a decreased appetite. Pt reports variable intake consuming ~ 2 meals/d, typically consisting of "bar food". Per chart, no meal completion percentages recorded.  Pt reported enjoying nutritional supplements and amenable to continue during admission and possibly after discharge.  RD also consulted for nutrition education regarding diabetes.   Lab Results  Component Value Date   HGBA1C 12.6 (H) 06/10/2017   Pt inquired about how to increase PO intake. RD educated on small frequent meals. RD discussed importance of regular carbohydrate consumption throughout the day. Provided examples of ways to balance meals/snacks and encouraged intake of high-fiber, whole grain complex carbohydrates.  RD provided "Carbohydrate Counting for People with Diabetes" handout from the Academy of Nutrition and Dietetics. Discussed different food groups and their effects on blood sugar, emphasizing carbohydrate-containing foods. Provided list of carbohydrates and recommended serving sizes of common foods.  Teach back method used. Expect fair compliance.  Labs  reviewed; CBG 74-325, Na 132 Medications reviewed; folic acid, Novolog insulin, Lantus, Protonix, Thiamine  Nutrition focused physical exam completed. Findings include no fat or muscle depletion.   Diet Order:  Diet Carb Modified Fluid consistency: Thin; Room service appropriate? Yes  Skin:  Reviewed, no issues  Last BM:  06/10/17  Height:   Ht Readings from Last 1 Encounters:  06/10/17 5\' 10"  (1.778 m)    Weight:   Wt Readings from Last 1 Encounters:  06/10/17 258 lb 2.5 oz (117.1 kg)    Ideal Body Weight:  75.5 kg  BMI:  Body mass index is 37.04 kg/m.  Estimated Nutritional Needs:   Kcal:  2200-2400  Protein:  140-150 grams  Fluid:  >/= 2 L/d  EDUCATION NEEDS:   Education needs addressed  Parks Ranger, MS, RDN, LDN 06/11/2017 3:50 PM

## 2017-06-12 LAB — BASIC METABOLIC PANEL
Anion gap: 12 (ref 5–15)
BUN: 12 mg/dL (ref 6–20)
CALCIUM: 9 mg/dL (ref 8.9–10.3)
CHLORIDE: 98 mmol/L — AB (ref 101–111)
CO2: 24 mmol/L (ref 22–32)
CREATININE: 1.16 mg/dL (ref 0.61–1.24)
GFR calc non Af Amer: >60 mL/min (ref >60)
Glucose, Bld: 220 mg/dL — ABNORMAL HIGH (ref 65–99)
Potassium: 4.2 mmol/L (ref 3.5–5.1)
SODIUM: 134 mmol/L — AB (ref 135–145)

## 2017-06-12 LAB — GLUCOSE, CAPILLARY
Glucose-Capillary: 202 mg/dL — ABNORMAL HIGH (ref 65–99)
Glucose-Capillary: 180 mg/dL — ABNORMAL HIGH (ref 65–99)

## 2017-06-12 LAB — CBC
HCT: 31.6 % — ABNORMAL LOW (ref 39.0–52.0)
HEMOGLOBIN: 10.7 g/dL — AB (ref 13.0–17.0)
MCH: 31.6 pg (ref 26.0–34.0)
MCHC: 33.9 g/dL (ref 30.0–36.0)
MCV: 93.2 fL (ref 78.0–100.0)
Platelets: 230 10*3/uL (ref 150–400)
RBC: 3.39 MIL/uL — ABNORMAL LOW (ref 4.22–5.81)
RDW: 15.9 % — ABNORMAL HIGH (ref 11.5–15.5)
WBC: 6.7 10*3/uL (ref 4.0–10.5)

## 2017-06-12 MED ORDER — LACTULOSE 10 GM/15ML PO SOLN: 10.0000 g | Freq: Every day | ORAL | 3 refills | Status: DC

## 2017-06-12 MED ORDER — INSULIN GLULISINE 100 UNIT/ML SOLOSTAR PEN: PEN_INJECTOR | SUBCUTANEOUS | 0 refills | Status: DC

## 2017-06-12 MED ORDER — INSULIN GLARGINE 100 UNIT/ML SOLOSTAR PEN
13.0000 [IU] | PEN_INJECTOR | Freq: Every day | SUBCUTANEOUS | 0 refills | Status: DC
Start: 2017-06-13 — End: 2017-06-16

## 2017-06-12 MED ORDER — GLUCERNA SHAKE PO LIQD: 237.0000 mL | Freq: Two times a day (BID) | ORAL | 14 refills | Status: DC

## 2017-06-12 NOTE — Progress Notes (Signed)
Family Medicine Teaching Service Daily Progress Note Intern Pager: [phone]  Patient name: Jeffrey Jeffrey [name] Medical record number: [id number] Date of birth: 1965/12/06 Age: 51 y.o. Gender: male  Primary Care Provider: Sela Hilding, MD Consultants: none Code Status: Full  Pt Overview and Major Events to Date:  Jeffrey Jeffrey [name] is a 51 y.o. male presenting with fatigue and weakness and found to have severe hyperglycemia with other labs findings consistent with DKA . PMH is significant for DM II, obesity, alcohol abuse, cardiac arrest, HTN, HLD, anxiety and OSA.  Assessment and Plan:  Poorly controlled DM-II: A1c 12.6 on 06/10/2017. Current episode likely secondary to medication non adherence in the setting of recent regimen change. Now on sub q meds and eating well.  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 Goal is for patient to feel comfortable on what he will be going with in terms of insulin.  - nutrition saw patient and recommends continuing Glucerna shakes  AKI, resolved: Likely from dehydration in the setting of hyperglycemia and DKA. Elevated creatinine is likely due to uncontrolled T2DM with some early diabetic nephropathy contributing to worsening renal function. Has an outpatient nephrology consult pending. - Restarting home ARB and HCTZ with resolution of AKI - Trend BMP - Cr 3.15 on 9/03> 1.16 on 9/06  Hypertension: Stable, last BP 152/106. Home regimen include on HCTZ 25 mg, Lopressor 100 mg BID and olmesartan 40 mg daily. On admission, BP159/93. - Continue Metoprolol 100 mg bid - Restarting irbesartan 300 mg while admitted, and HCTZ 25 mg given resolution of AKI and hypertension  Obstructive Sleep Apnea: Reports compliance with CPAP at home -  Will order CPAP nightly, refused   Anemia: Improving. Hgb on admit 13.6> 10.7on 9/6 - Monitor at follow up  Alcohol abuse, chronic, uncontrol: Previous history of abuse of EtOH. Admits to drinking a 40 oz beer daily at least 4 times a week. - CIWA discontinued  - Folate and thiamine  - social work consult  Hepatic Cirrhosis, stable: Patient does not show any stigmata of HE, mentation at baseline, no asterixis noted on exam.  Likely secondary to alcoholism. HCV and HBV negative last year. [name] on 03/09/2015 with hepatic steatosis -Hold home lactulose in the setting of dehydration, will resume with improving volume status and resolved DKA.  History of Seizure disorder, chronic, well controlled: Stable on Depakote. No recent seizure. - Continue home dose  GERD: Stable, on Prilosec. - Continue home medication for now  Hyperlipidemia: Stable. Continue home meds - Continue atorvastatin and ASA  Vision Changes: Likely secondary to diabetic retinopathy - Continue Polyvinyl alcohol ophthalmic solution  FEN/GI: carb modified diet Prophylaxis: SubQ Heparin  Disposition:home  Subjective:  Patient ready to go home. Feels better about his sugars. He wants his prescriptions sent to the HD and he has a friend who will help him pay.   Objective: Temp:  [98.4 F (36.9 C)-98.6 F (37 C)] 98.6 F (37 C) (09/05 2211) Pulse Rate:  [75-86] 78 (09/05 2211) Resp:  [16] 16 (09/05 2211) BP: (132-149)/(84-103) 132/84 (09/05 2211) SpO2:  [99 %-100 %] 100 % (09/05 2211) Physical Exam: General: NAD, pleasant obese male Cardiovascular: RRR, no m/r/g, no LE edema Respiratory: CTA BL, normal work of breathing  Gastrointestinal: soft, tender in LLQ, nondistended Extremities: No BLE edema, moves 4 extremities equally  Laboratory:  Recent Labs Lab 06/09/17 2023 06/10/17 0356 06/11/17 0536  WBC 7.5 7.8 6.0  HGB 11.4* 10.6* 9.2*  HCT 33.7* 31.3* 27.9*  PLT 295 276 222    Recent Labs Lab  06/10/17 0712 06/10/17 1016 06/11/17 0536  NA 131* 131* 132*  K 4.0 4.6 4.4  CL 96* 95* 100*  CO2 25 25 24   BUN 23* 23* 15  CREATININE 2.12* 2.04* 1.43*  CALCIUM 8.1* 8.3* 8.1*  GLUCOSE 72 229* 275*    Imaging/Diagnostic Tests: No results found.  Jeffrey Jeffrey [name], [name], DO 06/12/2017, 6:32 AM PGY-1, [place] Intern pager: [phone], text pages welcome

## 2017-06-12 NOTE — Progress Notes (Signed)
Pt Dc'd at 1558. PIVs removed and dressings placed. AVS was discussed and pt was given prescriptions. Allo education and care plans completed. Pt left unit via wheelchair.

## 2017-06-12 NOTE — Discharge Instructions (Signed)
You were admitted in Diabetic Keto Acidosis from your blood sugars being too high. We have adjusted your insulin while admitted. You will need to take 13 U of Lantus every morning. You will need to take 4 U of your Apidra each morning and evening with your bigger meals. You will need close follow up with the family medicine clinic. You have an appointment with Dr. Valentina Lucks on 06/16/17. Please make sure you check your sugars as discussed.

## 2017-06-12 NOTE — Progress Notes (Addendum)
Inpatient Diabetes Program Recommendations  AACE/ADA: New Consensus Statement on Inpatient Glycemic Control (2015)  Target Ranges:  Prepandial:   less than 140 mg/dL      Peak postprandial:   less than 180 mg/dL (1-2 hours)      Critically ill patients:  140 - 180 mg/dL   Lab Results  Component Value Date   GLUCAP 202 (H) 06/12/2017   HGBA1C 12.6 (H) 06/10/2017    Review of Glycemic Control  Results for CHARLEY, Jeffrey [name] (MRN [id number]) as of 06/12/2017 11:15  Ref. Range 06/11/2017 07:48 06/11/2017 12:20 06/11/2017 17:29 06/11/2017 22:15 06/12/2017 08:05  Glucose-Capillary Latest Ref Range: 65 - 99 mg/dL 232 (H) 325 (H) 326 (H) 271 (H) 202 (H)    Diabetes history: Type 2 Outpatient Diabetes medications: Lantus 10 units qhs, Apidra 3-5 units with largest meal Current orders for Inpatient glycemic control: Lantus 13 units qday, Novolog 4 units with breakfast and supper, Novolog 0-9 units tid.   Inpatient Diabetes Program Recommendations:  Noted adjustments made to insulin yesterday and started this am.  Consider adding Novolog 0-5 units hs while inpatient.   Spoke with patient and his wife regarding medications and testing supplies since notes indicate that he said they are too expensive.  He tells me he is getting his meter from a free clinic when he leaves here and his Lantus and Apidra from a medication assistance program.    I have told him that Suzie Portela has a Reli-on brand of meter and [employer] Reli-on brand N and R insulin that could be alternative medications to Lantus and Apidra, should those medications be unavailable.  Although he can purchase N and R insulin at [facility] without a prescription, he would need to discuss switching insulin types with his physician if Lantus and Apidra became unavailable because although they can control blood sugars, they are not a direct replacement and dosing would need to be established. Acknowledged undertanding  Gentry Fitz, RN, [place], [place],  CDE Diabetes Coordinator Inpatient Diabetes Program  [phone] (Team Pager) [phone] ([place]) 06/12/2017 11:19 AM

## 2017-06-16 ENCOUNTER — Ambulatory Visit (INDEPENDENT_AMBULATORY_CARE_PROVIDER_SITE_OTHER): Payer: Self-pay | Admitting: Pharmacist

## 2017-06-16 ENCOUNTER — Encounter: Payer: Self-pay | Admitting: Pharmacist

## 2017-06-16 DIAGNOSIS — E119 Type 2 diabetes mellitus without complications: Secondary | ICD-10-CM

## 2017-06-16 MED ORDER — INSULIN GLARGINE 100 UNIT/ML SOLOSTAR PEN: 12.0000 [IU] | PEN_INJECTOR | Freq: Every day | SUBCUTANEOUS | Status: DC

## 2017-06-16 MED ORDER — INSULIN GLULISINE 100 UNIT/ML SOLOSTAR PEN: 5.0000 [IU] | PEN_INJECTOR | Freq: Two times a day (BID) | SUBCUTANEOUS | 0 refills | Status: DC

## 2017-06-16 NOTE — Progress Notes (Signed)
Patient ID: Jeffrey [name], male   DOB: 1965-12-16, 51 y.o.   MRN: [id number] Reviewed: Agree with Dr. Graylin Shiver documentation and management.

## 2017-06-16 NOTE — Patient Instructions (Signed)
Good to see today.   Reduce your Lantus to 12 units once daily in the morning.   Increase your Apidra to 5 units with breakfast  AND 5 units if you eat a second large meal of the day.  Use 2 units of Apidra if you see a blood sugar higher than 300 later in the day.   Consider using a "premium protein" - with milk as a meal replacement.   Follow with Rx Clinic in 10 days (Thursday).

## 2017-06-16 NOTE — Progress Notes (Signed)
    S:     Chief Complaint  Patient presents with  . Medication Management    diabetes    Patient arrives in fair spirits ambulating without assistance.  Presents for diabetes evaluation, education, and management following recent hospitalization for extreme hyperglycemia.  Patient reports erratic blood sugars since hospital discharge on his new blood glucose meter.  He states he takes most of his medications as prescribed however he wished [name] to clarify how to use Apidra. Patient reports that he skips insulin doses to avoid further lows, especially when he sees that he is already low.  Current diabetes medications include: Lantus 13 units daily, Apidra 5 units with meals  Current hypertension medications include: olmesartan (Benicar) 40 mg daily, HCTZ 15m daily, metoprolol 100 mg BID  Patient reports multiple symptomatic hypoglycemic events.  Patient reported dietary habits: Eats 2-3 meals/day Breakfast:  Eggs, toast, grits, milk 2nd meal of the day: (skips this meal 3x per week).  Chicken - baked, green beans, Mac and Cheese, Corn, mashed potatoes,  Snacks: Fruit Cocktail Drinks:  Water  Patient reported exercise habits: walking around his neighborhood every day; a few miles at a time     O:  Physical Exam  Constitutional: He appears well-developed and well-nourished.  Vitals reviewed.  No edema in legs bilaterally.   Review of Systems  All other systems reviewed and are negative.    Lab Results  Component Value Date   HGBA1C 12.6 (H) 06/10/2017   There were no vitals filed for this visit.  Home fasting CBG: 50-100s, excursions to >200s  2 hour post-prandial/random CBG: 300s-"Hi".   A/P: Diabetes longstanding uncontrolled with A1c 12.6 and uncontrolled blood sugars consistently >200 with significant fluctuations in blood glucose. Patient reports hypoglycemic events and is able to verbalize appropriate hypoglycemia management plan. Patient adherence with Apidra  (raid acting/meal-time insulin) was erratic. Control is suboptimal due to poor diet and nonadherence to rapid acting insulin. -Decrease Lantus dose from 13 units daily to 12 units daily -Continue Apidra 5 units with meals. Add SSI of 2 units injected once daily if sugar is >300 -Next A1C anticipated January 2019.    ASCVD risk greater than 7.5%. Continued Aspirin 81 mg and Continued atorvastatin 80 mg.   Written patient instructions provided.  Total time in face to face counseling 30 minutes.   Follow up in Pharmacist Clinic Visit in 10 days.   Patient seen with RCleotis Lema PharmD Candidate

## 2017-06-16 NOTE — Assessment & Plan Note (Signed)
Diabetes longstanding uncontrolled with A1c 12.6 and uncontrolled blood sugars consistently >200 with significant fluctuations in blood glucose. Patient reports hypoglycemic events and is able to verbalize appropriate hypoglycemia management plan. Patient adherence with Apidra (raid acting/meal-time insulin) was erratic. Control is suboptimal due to poor diet and nonadherence to rapid acting insulin. -Decrease Lantus dose from 13 units daily to 12 units daily -Continue Apidra 5 units with meals. Add SSI of 2 units injected once daily if sugar is >300

## 2017-06-19 ENCOUNTER — Inpatient Hospital Stay: Payer: Self-pay | Admitting: Family Medicine

## 2017-06-26 ENCOUNTER — Other Ambulatory Visit: Payer: Self-pay | Admitting: Family Medicine

## 2017-06-26 ENCOUNTER — Ambulatory Visit (INDEPENDENT_AMBULATORY_CARE_PROVIDER_SITE_OTHER): Payer: Self-pay | Admitting: Pharmacist

## 2017-06-26 DIAGNOSIS — E119 Type 2 diabetes mellitus without complications: Secondary | ICD-10-CM

## 2017-06-26 DIAGNOSIS — E1142 Type 2 diabetes mellitus with diabetic polyneuropathy: Secondary | ICD-10-CM

## 2017-06-26 DIAGNOSIS — G40909 Epilepsy, unspecified, not intractable, without status epilepticus: Secondary | ICD-10-CM

## 2017-06-26 MED ORDER — DIVALPROEX SODIUM ER 500 MG PO TB24: 500.0000 mg | ORAL_TABLET | Freq: Two times a day (BID) | ORAL | 0 refills | Status: DC

## 2017-06-26 MED ORDER — GABAPENTIN 300 MG PO CAPS: 300.0000 mg | ORAL_CAPSULE | Freq: Four times a day (QID) | ORAL | 0 refills | Status: DC

## 2017-06-26 MED ORDER — INSULIN GLARGINE 100 UNIT/ML SOLOSTAR PEN: 12.0000 [IU] | PEN_INJECTOR | Freq: Every day | SUBCUTANEOUS | 3 refills | Status: DC

## 2017-06-26 MED ORDER — INSULIN GLULISINE 100 UNIT/ML SOLOSTAR PEN: PEN_INJECTOR | SUBCUTANEOUS | 0 refills | Status: DC

## 2017-06-26 MED ORDER — INSULIN GLULISINE 100 UNIT/ML SOLOSTAR PEN: PEN_INJECTOR | SUBCUTANEOUS | 3 refills | Status: DC

## 2017-06-26 NOTE — Progress Notes (Signed)
Patient ID: Jeffrey [name], male   DOB: 23-Apr-1966, 51 y.o.   MRN: [id number] Reviewed: Agree with Dr. Graylin Shiver documentation and management.

## 2017-06-26 NOTE — Patient Instructions (Addendum)
Thanks for coming to see [name]!   1. Change your Apidra (fast acting insulin). Directions below: - Drink two glucerna's every day and eat your evening meal every day -When your blood sugar is more than 200 and you are drinking a glucerna, take 2 units of apidra.  -When your blood sugar is less than 200 and you are drinking a glucerna, do not take any apidra -When it is time to eat your evening meal, if your blood sugar is more than 100, take 5 units of apidra.  -When it is time to eat your evening meal, if your blood sugar is less than 100, do not take any apidra.   2. Keep taking Lantus 12 units once a day  3. Increase your gabapentin to 300 mg (1 tablet) four times a day. We will give you a hard copy prescription for this.   4. Increase your Depakote to 500 mg (1 tablet) two times a day. We will give you a hard copy prescription for this.   5. We are drawing labs today. We will call you if there are any issues.  Come back to see Dr. Lindell Noe in 1 week. If she cannot see you, make an appointment to see me instead.

## 2017-06-26 NOTE — Progress Notes (Signed)
S:     Chief Complaint  Patient presents with  . Medication Management    Diabetes    Patient arrives in fair spirits, ambulating without assistance.  Presents for diabetes evaluation, education, and management at the request of  Dr. Lindell Noe. Patient was referred on 05/02/2017.  Patient was last seen by Primary Care Provider on 05/02/2017.  Patient returns as follow up to last Rx clinic on 06/16/2017. At that time, insulin was adjusted and adherence was reinforced.  States that he has had 8 seizures over the weekend, last seizure on Tuesday, more often occur at night. Willing to increase dose of seizure meds if necessary. CBGs have been high 2/2 seizures per patient.  Reports that his meter was not working for a while but got it working again over the weekend.   Will occasionally increase gabapentin to 300 mg four times daily when nerve pain increases. Taking lactulose every other day because "it makes me run". Complains of uncontrolled acid reflux - takes two omeprazole 20 mg tablets - requests new Rx for 40 mg daily.  Endorses financial hardship, currently applying for disability. Requests samples for Glucerna shakes (would like 2/day) because he is frustrated with CBG spikes after PO intake. States that he would like to drink them in the morning and around 1-2PM.   Complains that cialis does not help decreased libido anymore. Inquires if this is attributable to valproic acid.  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  Current diabetes medications include: apidra 5 mg with meals,2 units for high CBGs, lantus 12 units daily  Current hypertension medications include: HCTZ 25 mg daily, metoprolol 100 mg BID, olmesartan 40 mg daily  Patient reports  hypoglycemic events. Endorses some degree of hypoglycemia unawareness. Patient reports that he took a correction dose of apidra and then did not eat which resulted in severe hypoglycemic episode < 50 mg/dL.   Patient reported dietary habits: Eats variable # meals/day - states that sometimes he goes all day without eating, biggest meal is with supper between 6-8PM.  Breakfast: 2 scrambled eggs, biscuit and gravy, milk Lunch: did not eat EtOH: denies significant intake  Patient was recently discharged from hospital and all medications have been reviewed. O:  Physical Exam  Constitutional: He appears well-developed and well-nourished.  Vitals reviewed.  Review of Systems  All other systems reviewed and are negative.  Lab Results  Component Value Date   HGBA1C 12.6 (H) 06/10/2017   There were no vitals filed for this visit.  Home fasting CBG: 42-200s 2 hour post-prandial/random CBG: 200s-400s  A/P: Diabetes longstanding currently uncontrolled per A1C and variable CBGs on meter. Patient reports hypoglycemic events and is able to verbalize appropriate hypoglycemia management plan. Patient reports adherence with medication. Control is suboptimal due to erratic eating 2/2 decreased appetite and suspected food insecurity, unoptimized insulin dosing, limited understanding of appropriate insulin timing and dosing. -Change apidra - 2 units twice daily with Glucerna as long as CBG > 200, 5 units with evening meal as long as CBG > 100. Rx faxed to [facility]. -Continue Lantus 12 units daily. Rx faxed to [facility].  -Check CBGs three times a day -Provided samples #6 of Glucerna for patient  Next A1C anticipated December 2018.    ASCVD risk greater than 7.5%. Continued Aspirin 81 mg and Continued  atorvastatin 80 mg.    Seizures - uncontrolled per patient report, currently on gabapentin 300 mg TID-QID and valproic acid 500 mg daily. No therapeutic drug monitoring records on file. Do not suspect toxicity at  this time.   After discussion with Dr. Gwendlyn Deutscher:  -Increase valproic acid XR to 500 mg BID, hard copy Rx provided to patient to mail to [facility]. Patient states that he has enough supply to increase to BID dosing. -Valproic acid level, CMET, CBC today -Discouraged EtOH use -Encouraged f/u with neurologist, will route note to his Neurologist, Dr. [name].   Diabetic neuropathy - uncontrolled per patient report After discussion with Dr. Gwendlyn Deutscher:  -Patient has self-increased gabapentin to 300 mg four times daily. Agreed with change to higher dose as neuropathic pain has worsened recently.  Discussed increase and obtained new prescription with Dr. Gwendlyn Deutscher. Hard copy Rx provided to patient to mail to [place]. Patient states that he has enough supply to increase to QID dosing.   Acid reflux - uncontrolled per patient report on 20 mg daily. Sx improved with increased dose.  -Will address at follow up visit. Plan to increase to 40 mg daily and provide hard copy Rx to mail to [facility].   Decreased libido - cialis no longer effective per patient. Not likely attributable to valproic acid.  -Follow up with Dr. Lindell Noe at appointment  Written patient instructions provided.  Total time in face to face counseling 45 minutes.   Follow up with PCP in 1 week, if PCP not available, follow up with Pharmacy Clinic instead in 1 week.   Patient seen with Cleotis Lema, PharmD Candidate, and Deirdre Pippins, PharmD PGY-2 Resident.

## 2017-06-26 NOTE — Telephone Encounter (Signed)
Seen at the pharmacy clinic for multiple seizures in the last 1 week. No Seizure today. Also worsening of his neuropathic pain. He self increased his Gabapentin to 300 mg QID from TID which help to improve his pain. Dr. Valentina Lucks will check Depakote level today and go up on Depakote till he gets in to see his neurologist.  I recommended lab check including depakote level. Contact neurologist as soon as possible to schedule f/u. Go to ED if he has seizure, he will need neuro imaging and/or EEG.  I refilled meds per pharmacy request. Patient not evaluated by me today. Dr. Valentina Lucks agreed with plan.

## 2017-06-26 NOTE — Assessment & Plan Note (Signed)
Diabetic neuropathy - uncontrolled per patient report After discussion with Dr. Gwendlyn Deutscher:  -Patient has self-increased gabapentin to 300 mg four times daily. Agreed with change to higher dose as neuropathic pain has worsened recently.  Discussed increase and obtained new prescription with Dr. Gwendlyn Deutscher. Hard copy Rx provided to patient to mail to [place]. Patient states that he has enough supply to increase to QID dosing.

## 2017-06-26 NOTE — Assessment & Plan Note (Signed)
Diabetes longstanding currently uncontrolled per A1C and variable CBGs on meter. Patient reports hypoglycemic events and is able to verbalize appropriate hypoglycemia management plan. Patient reports adherence with medication. Control is suboptimal due to erratic eating 2/2 decreased appetite and suspected food insecurity, unoptimized insulin dosing, limited understanding of appropriate insulin timing and dosing. -Change apidra - 2 units twice daily with Glucerna as long as CBG > 200, 5 units with evening meal as long as CBG > 100. Rx faxed to [facility]. -Continue Lantus 12 units daily. Rx faxed to [facility].  -Check CBGs three times a day -Provided samples #6 of Glucerna for patient  Next A1C anticipated December 2018.

## 2017-06-26 NOTE — Assessment & Plan Note (Signed)
Seizures - uncontrolled per patient report, currently on gabapentin 300 mg TID-QID and valproic acid 500 mg daily. No recent therapeutic drug monitoring records on file. Do not suspect toxicity at this time.   After discussion with Dr. Gwendlyn Deutscher:  -Increase valproic acid XR to 500 mg BID, hard copy Rx provided to patient to mail to [place]. Patient states that he has enough supply to increase to BID dosing. -Valproic acid level, CMET, CBC today -Discouraged EtOH use -Encouraged f/u with neurologist, will route note to his Neurologist, Dr. [name].

## 2017-06-27 LAB — CBC
Hematocrit: 33.8 % — ABNORMAL LOW (ref 37.5–51.0)
Hemoglobin: 10.8 g/dL — ABNORMAL LOW (ref 13.0–17.7)
MCH: 30.8 pg (ref 26.6–33.0)
MCHC: 32 g/dL (ref 31.5–35.7)
MCV: 96 fL (ref 79–97)
PLATELETS: 257 10*3/uL (ref 150–379)
RBC: 3.51 x10E6/uL — AB (ref 4.14–5.80)
RDW: 16.2 % — AB (ref 12.3–15.4)
WBC: 6.3 10*3/uL (ref 3.4–10.8)

## 2017-06-27 LAB — COMPREHENSIVE METABOLIC PANEL
A/G RATIO: 1.1 — AB (ref 1.2–2.2)
ALK PHOS: 211 IU/L — AB (ref 39–117)
ALT: 39 IU/L (ref 0–44)
AST: 60 IU/L — AB (ref 0–40)
Albumin: 3.7 g/dL (ref 3.5–5.5)
BILIRUBIN TOTAL: 0.3 mg/dL (ref 0.0–1.2)
BUN/Creatinine Ratio: 12 (ref 9–20)
BUN: 12 mg/dL (ref 6–24)
CHLORIDE: 102 mmol/L (ref 96–106)
CO2: 22 mmol/L (ref 20–29)
Calcium: 8.7 mg/dL (ref 8.7–10.2)
Creatinine, Ser: 1.03 mg/dL (ref 0.76–1.27)
GFR calc non Af Amer: 83 mL/min/{1.73_m2} (ref >59)
GFR, EST AFRICAN AMERICAN: 95 mL/min/{1.73_m2} (ref >59)
GLUCOSE: 64 mg/dL — AB (ref 65–99)
Globulin, Total: 3.4 g/dL (ref 1.5–4.5)
POTASSIUM: 4.3 mmol/L (ref 3.5–5.2)
Sodium: 140 mmol/L (ref 134–144)
TOTAL PROTEIN: 7.1 g/dL (ref 6.0–8.5)

## 2017-06-27 LAB — VALPROIC ACID LEVEL: Valproic Acid Lvl: <4 ug/mL — ABNORMAL LOW (ref 50–100)

## 2017-07-03 ENCOUNTER — Encounter: Payer: Self-pay | Admitting: Family Medicine

## 2017-07-03 ENCOUNTER — Ambulatory Visit (INDEPENDENT_AMBULATORY_CARE_PROVIDER_SITE_OTHER): Payer: Self-pay | Admitting: Family Medicine

## 2017-07-03 VITALS — BP 112/68 | HR 85 | Temp 97.9°F | Ht 70.0 in | Wt 235.2 lb

## 2017-07-03 DIAGNOSIS — G40909 Epilepsy, unspecified, not intractable, without status epilepticus: Secondary | ICD-10-CM

## 2017-07-03 DIAGNOSIS — K7460 Unspecified cirrhosis of liver: Secondary | ICD-10-CM

## 2017-07-03 DIAGNOSIS — E119 Type 2 diabetes mellitus without complications: Secondary | ICD-10-CM

## 2017-07-03 DIAGNOSIS — K7030 Alcoholic cirrhosis of liver without ascites: Secondary | ICD-10-CM

## 2017-07-03 DIAGNOSIS — R29898 Other symptoms and signs involving the musculoskeletal system: Secondary | ICD-10-CM

## 2017-07-03 DIAGNOSIS — Z114 Encounter for screening for human immunodeficiency virus [HIV]: Secondary | ICD-10-CM

## 2017-07-03 DIAGNOSIS — R479 Unspecified speech disturbances: Secondary | ICD-10-CM

## 2017-07-03 DIAGNOSIS — R4781 Slurred speech: Secondary | ICD-10-CM

## 2017-07-03 MED ORDER — LACTULOSE 10 GM/15ML PO SOLN: 10.0000 g | Freq: Every day | ORAL | 3 refills | Status: DC

## 2017-07-03 NOTE — Progress Notes (Signed)
Subjective:    Patient ID: Jeffrey [name] , male   DOB: 04/21/1966 , 51 y.o..   MRN: [id number]  HPI  Jeffrey [name] is a 51 yo M with PMH of T2DM, seizure disorder, HTN, hepatic cirrhosis, EtOh abuse, anoxic brain injury, OSA on CPAP, HLD here for  Chief Complaint  Patient presents with  . Hospitalization Follow-up   1. Hospital Follow up for DKA:  Patient presented to hospital with fatigue and weakness x 3 days. Found to be in DKA likely secondary to medication non adherence in the setting of recent regimen change. Was placed on glucomander and then sub q insulin which was titrated as needed. Since hospital discharge he has seen Dr. Valentina Lucks twice who has been titrating his insulin as needed. Patient reports good compliance with his medication regimen which is apidra - 2 units twice daily with Glucerna as long as CBG > 200, 5 units with evening meal as long as CBG > 100, Lantus 12 units daily. Denies any  fatigue, nausea, vomiting, chest pain, shortness of breath. Denies any hypoglycemic episodes. Has been checking his glucose daily, notes that it's usually in the 80's or 70's. He continues to drink alcohol: 1-2 12 ounce beers a day.   Of concern, patient endorses slurred speech, difficulty with word finding, and bilateral hand weakness/numbness for the last 2 weeks. He notes that this is a new problem for him. He thinks that it has gotten worse over the last couple weeks. Also endorses some right eye blurriness over the last 2 days. Notes that he broke up late this morning because he couldn't hold in his hands. He has trouble gripping and holding things. Has not had any trouble with balance or walking.  Review of Systems: Per HPI.   Past Medical History: Patient Active Problem List   Diagnosis Date Noted  . Speech disturbance 07/03/2017  . DKA (diabetic ketoacidoses) ([place]) 06/09/2017  . Hyponatremia 04/08/2017  . Seizure disorder ([place]) 04/08/2017  . Depression 12/17/2015  . Hip pain  12/17/2015  . Proteinuria 11/23/2015  . Loss of teeth 11/23/2015  . Essential hypertension   . Diabetic polyneuropathy associated with type 2 diabetes mellitus ([place]) 08/04/2015  . Hepatic cirrhosis ([place]) 03/22/2015  . Ataxia 03/20/2015  . Aphasia   . Elevated liver enzymes   . Dysarthria 03/09/2015  . AKI (acute kidney injury) ([place])   . Alcohol intoxication ([place])   . Erosive esophagitis 10/10/2014  . HLD (hyperlipidemia)   . Passed out 10/08/2014  . Syncope and collapse   . OSA on CPAP 12/28/2012  . Pulmonary edema 12/08/2012  . Cardiac arrest ([place]) 12/07/2012  . Obesity 12/07/2012  . Anoxic brain injury ([place]) 12/07/2012  . GI bleed 11/26/2012  . Venous insufficiency 12/13/2011  . 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 ALCOHOL ABUSE, HX OF 11/10/2007  . ERECTILE DYSFUNCTION 04/14/2007  . Type 2 diabetes mellitus with hemoglobin A1c goal of less than 7.5% ([place]) 12/04/2006  . HYPERCHOLESTEROLEMIA 12/04/2006  . OBESITY, NOS 12/04/2006  . Anxiety state 12/04/2006  . HYPERTENSION, BENIGN SYSTEMIC 12/04/2006  . ARTHRITIS 12/04/2006    Medications: reviewed and updated Current Outpatient Prescriptions  Medication Sig Dispense Refill  . aspirin EC 81 MG tablet Take 81 mg by mouth at bedtime.     [name] atorvastatin (LIPITOR) 80 MG tablet Take 1 tablet (80 mg total) by mouth daily. (Patient taking differently: Take 80 mg by mouth at bedtime. ) 30 tablet 3  .  divalproex (DEPAKOTE ER) 500 MG 24 hr tablet Take 1 tablet (500 mg total) by mouth 2 (two) times daily. 180 tablet 0  . feeding supplement, GLUCERNA SHAKE, (GLUCERNA SHAKE) LIQD Take 237 mLs by mouth 2 (two) times daily between meals. 790 mL 14  . folic acid (FOLVITE) 1 MG tablet Take 1 tablet (1 mg total) by mouth daily. 30 tablet 12  . gabapentin (NEURONTIN) 300 MG capsule Take 1 capsule (300 mg total) by mouth 4 (four) times daily. 360 capsule 0  . hydrochlorothiazide (HYDRODIURIL) 25 MG tablet Take 1 tablet  (25 mg total) by mouth daily. (Patient taking differently: Take 25 mg by mouth at bedtime. ) 45 tablet 0  . Insulin Glargine (LANTUS) 100 UNIT/ML Solostar Pen Inject 12 Units into the skin daily before breakfast. 15 mL 3  . Insulin Glulisine (APIDRA SOLOSTAR) 100 UNIT/ML Solostar Pen Take 2 units two times a day with your Glucerna as long as your blood sugar is higher than 200. Take 5 units with your evening meal as long as your blood sugar is higher than 100. 15 mL 3  . metoprolol (LOPRESSOR) 100 MG tablet TAKE 1 Tablet  BY MOUTH TWICE DAILY (Patient taking differently: TAKE 100 mg Tablet  BY MOUTH TWICE DAILY) 90 tablet 3  . naproxen (NAPROSYN) 500 MG tablet Take 1 tablet (500 mg total) by mouth 2 (two) times daily with a meal. (Patient taking differently: Take 500 mg by mouth daily. ) 60 tablet 0  . olmesartan (BENICAR) 40 MG tablet Take 1 tablet (40 mg total) by mouth daily. (Patient taking differently: Take 40 mg by mouth at bedtime. ) 90 tablet 2  . omeprazole (PRILOSEC) 20 MG capsule Take 1 capsule (20 mg total) by mouth daily. (Patient taking differently: Take 40 mg by mouth daily. ) 30 capsule 3  . polyvinyl alcohol (LIQUIFILM TEARS) 1.4 % ophthalmic solution Place 1 drop into both eyes every 4 (four) hours as needed for dry eyes. 15 mL 2  . tadalafil (CIALIS) 10 MG tablet Take 1 tablet (10 mg total) by mouth daily as needed for erectile dysfunction. 10 tablet 0  . thiamine (VITAMIN B-1) 100 MG tablet Take 1 tablet (100 mg total) by mouth daily. 30 tablet 12  . cyclobenzaprine (FLEXERIL) 10 MG tablet Take 1 tablet (10 mg total) by mouth 3 (three) times daily as needed for muscle spasms. (Patient not taking: Reported on 06/09/2017) 30 tablet 0  . lactulose (CHRONULAC) 10 GM/15ML solution Take 15 mLs (10 g total) by mouth daily. 473 mL 3  . Multiple Vitamin (MULTIVITAMIN WITH MINERALS) TABS tablet Take 1 tablet by mouth at bedtime.      No current facility-administered medications for this visit.      Social Hx:  reports that he has never smoked. His smokeless tobacco use includes Snuff.   Objective:   BP 112/68   Pulse 85   Temp 97.9 F (36.6 C) (Oral)   Ht 5\' 10"  (1.778 m)   Wt 235 lb 3.2 oz (106.7 kg)   SpO2 (!) 85%   BMI 33.75 kg/m  Physical Exam  Gen: NAD, alert, cooperative with exam, well-appearing HEENT: NCAT, PERRL, clear conjunctiva, oropharynx clear, supple neck Cardiac: Regular rate and rhythm, normal S1/S2, no murmur, no edema, capillary refill brisk  Respiratory: Clear to auscultation bilaterally, no wheezes, non-labored breathing Neurologic Exam:  Mental Status: alert; oriented to person, place and year; knowledge is normal for age; language is normal Cranial Nerves: 2-12 intact however  does have some slurred speech Motor: 4/5 grip strength bilaterally, otherwise Normal strength, tone and mass; good fine motor movements; no pronator drift Sensory: grossly intact throughout Gait and Station: normal gait and station Reflexes: no clonus  Assessment & Plan:  Type 2 diabetes mellitus with hemoglobin A1c goal of less than 7.5% (HCC) Uncontrolled her last A1c of 12.6. Has been seen twice by Dr. Everitt Amber since hospital discharge earlier this month. Glucoses have been ranging between 70 and 80s, I think that this is too low for him. Has been taking 13 units of Lantus insulin 12 units and 5 units of Apidra twice daily. -Encouraged patient to continue checking his glucose daily and keep a log -Lantus 12 units every morning -Apidra 2 units twice daily with Glucerna as long as CBG > 200, 5 units with evening meal as long as CBG > 100 -Follow-up with PCP in 1-2 weeks to see how his glucoses have been in titrate insulin as needed  Hepatic cirrhosis Admits to not taking lactulose. This may or may not be contributing to his change in speech and weakness today. Dr. Valentina Lucks had some prescription for lactulose to [facility] outpatient pharmacy, patient never picked it up.  -Gave  patient another prescription for lactulose, encouraged him to please take this medication daily  Seizure disorder [facility]) Has yet to call Dr. Rhea Belton office to follow-up. Continues to take his valproic acid as prescribed -Discussed alcohol cessation  -encouraged patient to please call Dr. Rhea Belton office to schedule a follow-up visit  Speech disturbance Slurred speech associated with bilateral hand weakness 2 weeks. Neurological exam showing 4/5 grip strength bilaterally, otherwise without any focal deficits. Leading differentials include hepatic encephalopathy secondary to not taking lactulose vs alcohol intoxication vs CVA. Discussed patient with Dr. Andria Frames. The plan is as follows:  -Noncontrast head CT within the next 24 hours -We will check RPR, ammonia, HIV -Continue to take folic acid and thiamine at home -Can consider checking vitamin B12 and future -Encouraged patient to take lactulose -Discussed alcohol cessation -Follow-up with PCP in 1-2 weeks  Orders Placed This Encounter  Procedures  . CT Head Wo Contrast    Standing Status:   Future    Standing Expiration Date:   10/02/2018    Order Specific Question:   Preferred imaging location?    Answer:   [facility]    Order Specific Question:   Radiology Contrast Protocol - do NOT remove file path    Answer:   [email or web address]    Order Specific Question:   Reason for Exam additional comments    Answer:   change in speech, hand weakness bilaterally  . Ammonia  . RPR  . HIV antibody (with reflex)   Meds ordered this encounter  Medications  . lactulose (CHRONULAC) 10 GM/15ML solution    Sig: Take 15 mLs (10 g total) by mouth daily.    Dispense:  473 mL    Refill:  3    Smitty Cords, MD [place], PGY-3

## 2017-07-03 NOTE — Assessment & Plan Note (Signed)
Admits to not taking lactulose. This may or may not be contributing to his change in speech and weakness today. Dr. Valentina Lucks had some prescription for lactulose to [facility] outpatient pharmacy, patient never picked it up.  -Gave patient another prescription for lactulose, encouraged him to please take this medication daily

## 2017-07-03 NOTE — Assessment & Plan Note (Addendum)
Slurred speech associated with bilateral hand weakness 2 weeks. Neurological exam showing 4/5 grip strength bilaterally, otherwise without any focal deficits. Leading differentials include hepatic encephalopathy secondary to not taking lactulose vs alcohol intoxication vs CVA. Discussed patient with Dr. Andria Frames. The plan is as follows:  -Noncontrast head CT within the next 24 hours -We will check RPR, ammonia, HIV -Continue to take folic acid and thiamine at home -Can consider checking vitamin B12 and future -Encouraged patient to take lactulose -Discussed alcohol cessation -Follow-up with PCP in 1-2 weeks

## 2017-07-03 NOTE — Assessment & Plan Note (Signed)
Has yet to call Dr. Rhea Belton office to follow-up. Continues to take his valproic acid as prescribed -Discussed alcohol cessation  -encouraged patient to please call Dr. Rhea Belton office to schedule a follow-up visit

## 2017-07-03 NOTE — Assessment & Plan Note (Signed)
Uncontrolled her last A1c of 12.6. Has been seen twice by Dr. Everitt Amber since hospital discharge earlier this month. Glucoses have been ranging between 70 and 80s, I think that this is too low for him. Has been taking 13 units of Lantus insulin 12 units and 5 units of Apidra twice daily. -Encouraged patient to continue checking his glucose daily and keep a log -Lantus 12 units every morning -Apidra 2 units twice daily with Glucerna as long as CBG > 200, 5 units with evening meal as long as CBG > 100 -Follow-up with PCP in 1-2 weeks to see how his glucoses have been in titrate insulin as needed

## 2017-07-03 NOTE — Patient Instructions (Addendum)
Thank you for coming in today, it was so nice to see you! Today we talked about:    Diabetes: Take 12 units of Lantus daily. Take Apidra 2 units twice a day with glucerna as long as blood sugar is higher than 200. Take 5 units with evening meal as long as your blood sugar is higher than 100   Change in speech and hand weakness: We have ordered some blood tests today and a scan of your brain  I have sent in a prescription for your lactulose, please take this as prescribed. This will help your feel better  Please go to the emergency room if you have worsening weakness or numbness anywhere or if your speech gets worse  Please schedule an appointment with Dr. [name] your neurologist  Please follow up in 1-2 weeks for a diabetes visit with Dr. Lindell Noe. You can schedule this appointment at the front desk before you leave or call the clinic.  Bring in all your medications or supplements to each appointment for review.   If we ordered any tests today, you will be notified via telephone of any abnormalities. If everything is normal you will get a letter in the mail.   If you have any questions or concerns, please do not hesitate to call the office at [phone]. You can also message me directly via MyChart.   Sincerely,  Smitty Cords, MD

## 2017-07-04 ENCOUNTER — Ambulatory Visit (HOSPITAL_COMMUNITY): Admission: RE | Admit: 2017-07-04 | Payer: Self-pay | Source: Ambulatory Visit

## 2017-07-04 ENCOUNTER — Telehealth: Payer: Self-pay | Admitting: *Deleted

## 2017-07-04 LAB — HIV ANTIBODY (ROUTINE TESTING W REFLEX): HIV Screen 4th Generation wRfx: NONREACTIVE

## 2017-07-04 LAB — AMMONIA: AMMONIA: 164 ug/dL — AB (ref 27–102)

## 2017-07-04 LAB — RPR: RPR: NONREACTIVE

## 2017-07-04 NOTE — Telephone Encounter (Signed)
Called patient's wife back at number below. Given symptoms of disorientation, he needs to be seen. Directed them to [facility] ED to workup altered mental status.

## 2017-07-04 NOTE — Telephone Encounter (Signed)
Patient's wife called stating she has concerns regarding issue with patient's behavior. She has noticed that patient is falling a lot, saying things and don't remember he says it.  Patient think it is night time but it is really day time.  She is really concerned and would like for PCP to give her a call. Please give her [phone], cell phone.  Derl Barrow, RN

## 2017-07-07 ENCOUNTER — Telehealth: Payer: Self-pay | Admitting: Family Medicine

## 2017-07-07 NOTE — Telephone Encounter (Signed)
Called patient to discuss his high ammonia level. He notes that he has been taking the lactulose since he came to the office. He has been having 3-4 BMs a day. Notes that he didn't obtain head CT due to cost. States he quit drinking alcohol since we talked. Still has slow/slurred speech. Has f/u in 1 week at our office. I would like for him to be seen sooner. Asked him to schedule an appointment tomorrow. He was appreciative of the call.   Smitty Cords, MD [place], PGY-3

## 2017-07-10 ENCOUNTER — Observation Stay (HOSPITAL_COMMUNITY)
Admission: EM | Admit: 2017-07-10 | Discharge: 2017-07-11 | Disposition: A | Discharge status: Home / Self Care | Payer: Self-pay | Attending: Family Medicine | Admitting: Family Medicine

## 2017-07-10 ENCOUNTER — Emergency Department (HOSPITAL_COMMUNITY): Payer: Self-pay

## 2017-07-10 ENCOUNTER — Encounter (HOSPITAL_COMMUNITY): Payer: Self-pay | Admitting: Emergency Medicine

## 2017-07-10 DIAGNOSIS — Z6833 Body mass index (BMI) 33.0-33.9, adult: Secondary | ICD-10-CM | POA: Insufficient documentation

## 2017-07-10 DIAGNOSIS — G40909 Epilepsy, unspecified, not intractable, without status epilepticus: Secondary | ICD-10-CM | POA: Insufficient documentation

## 2017-07-10 DIAGNOSIS — F1010 Alcohol abuse, uncomplicated: Secondary | ICD-10-CM

## 2017-07-10 DIAGNOSIS — Y92019 Unspecified place in single-family (private) house as the place of occurrence of the external cause: Secondary | ICD-10-CM | POA: Insufficient documentation

## 2017-07-10 DIAGNOSIS — R4182 Altered mental status, unspecified: Principal | ICD-10-CM | POA: Insufficient documentation

## 2017-07-10 DIAGNOSIS — S0003XA Contusion of scalp, initial encounter: Secondary | ICD-10-CM | POA: Insufficient documentation

## 2017-07-10 DIAGNOSIS — E1165 Type 2 diabetes mellitus with hyperglycemia: Secondary | ICD-10-CM | POA: Insufficient documentation

## 2017-07-10 DIAGNOSIS — Z791 Long term (current) use of non-steroidal anti-inflammatories (NSAID): Secondary | ICD-10-CM | POA: Insufficient documentation

## 2017-07-10 DIAGNOSIS — R739 Hyperglycemia, unspecified: Secondary | ICD-10-CM

## 2017-07-10 DIAGNOSIS — E669 Obesity, unspecified: Secondary | ICD-10-CM | POA: Insufficient documentation

## 2017-07-10 DIAGNOSIS — R4781 Slurred speech: Secondary | ICD-10-CM | POA: Insufficient documentation

## 2017-07-10 DIAGNOSIS — E785 Hyperlipidemia, unspecified: Secondary | ICD-10-CM | POA: Insufficient documentation

## 2017-07-10 DIAGNOSIS — E875 Hyperkalemia: Secondary | ICD-10-CM

## 2017-07-10 DIAGNOSIS — Z79899 Other long term (current) drug therapy: Secondary | ICD-10-CM | POA: Insufficient documentation

## 2017-07-10 DIAGNOSIS — N179 Acute kidney failure, unspecified: Secondary | ICD-10-CM | POA: Insufficient documentation

## 2017-07-10 DIAGNOSIS — E1142 Type 2 diabetes mellitus with diabetic polyneuropathy: Secondary | ICD-10-CM

## 2017-07-10 DIAGNOSIS — Z7982 Long term (current) use of aspirin: Secondary | ICD-10-CM | POA: Insufficient documentation

## 2017-07-10 DIAGNOSIS — Z8674 Personal history of sudden cardiac arrest: Secondary | ICD-10-CM | POA: Insufficient documentation

## 2017-07-10 DIAGNOSIS — K219 Gastro-esophageal reflux disease without esophagitis: Secondary | ICD-10-CM | POA: Insufficient documentation

## 2017-07-10 DIAGNOSIS — E1140 Type 2 diabetes mellitus with diabetic neuropathy, unspecified: Secondary | ICD-10-CM | POA: Insufficient documentation

## 2017-07-10 DIAGNOSIS — I10 Essential (primary) hypertension: Secondary | ICD-10-CM | POA: Insufficient documentation

## 2017-07-10 DIAGNOSIS — Y908 Blood alcohol level of 240 mg/100 ml or more: Secondary | ICD-10-CM | POA: Insufficient documentation

## 2017-07-10 DIAGNOSIS — Z794 Long term (current) use of insulin: Secondary | ICD-10-CM | POA: Insufficient documentation

## 2017-07-10 DIAGNOSIS — Z888 Allergy status to other drugs, medicaments and biological substances status: Secondary | ICD-10-CM | POA: Insufficient documentation

## 2017-07-10 DIAGNOSIS — F17209 Nicotine dependence, unspecified, with unspecified nicotine-induced disorders: Secondary | ICD-10-CM | POA: Insufficient documentation

## 2017-07-10 DIAGNOSIS — F329 Major depressive disorder, single episode, unspecified: Secondary | ICD-10-CM | POA: Insufficient documentation

## 2017-07-10 DIAGNOSIS — Z9114 Patient's other noncompliance with medication regimen: Secondary | ICD-10-CM | POA: Insufficient documentation

## 2017-07-10 DIAGNOSIS — F419 Anxiety disorder, unspecified: Secondary | ICD-10-CM | POA: Insufficient documentation

## 2017-07-10 DIAGNOSIS — K7460 Unspecified cirrhosis of liver: Secondary | ICD-10-CM | POA: Insufficient documentation

## 2017-07-10 DIAGNOSIS — G4733 Obstructive sleep apnea (adult) (pediatric): Secondary | ICD-10-CM | POA: Insufficient documentation

## 2017-07-10 DIAGNOSIS — Z9989 Dependence on other enabling machines and devices: Secondary | ICD-10-CM | POA: Insufficient documentation

## 2017-07-10 DIAGNOSIS — E860 Dehydration: Secondary | ICD-10-CM

## 2017-07-10 DIAGNOSIS — W19XXXA Unspecified fall, initial encounter: Secondary | ICD-10-CM | POA: Insufficient documentation

## 2017-07-10 LAB — CBC WITH DIFFERENTIAL/PLATELET
Basophils Absolute: 0.1 10*3/uL (ref 0.0–0.1)
Basophils Relative: 1 %
Eosinophils Absolute: 0.3 10*3/uL (ref 0.0–0.7)
Eosinophils Relative: 4 %
HEMATOCRIT: 35.9 % — AB (ref 39.0–52.0)
HEMOGLOBIN: 11.8 g/dL — AB (ref 13.0–17.0)
LYMPHS ABS: 1.6 10*3/uL (ref 0.7–4.0)
LYMPHS PCT: 23 %
MCH: 30.7 pg (ref 26.0–34.0)
MCHC: 32.9 g/dL (ref 30.0–36.0)
MCV: 93.5 fL (ref 78.0–100.0)
MONO ABS: 0.8 10*3/uL (ref 0.1–1.0)
MONOS PCT: 12 %
NEUTROS ABS: 4.1 10*3/uL (ref 1.7–7.7)
Neutrophils Relative %: 60 %
PLATELETS: 286 10*3/uL (ref 150–400)
RBC: 3.84 MIL/uL — ABNORMAL LOW (ref 4.22–5.81)
RDW: 14.8 % (ref 11.5–15.5)
WBC: 7 10*3/uL (ref 4.0–10.5)

## 2017-07-10 LAB — COMPREHENSIVE METABOLIC PANEL
ALBUMIN: 3.2 g/dL — AB (ref 3.5–5.0)
ALK PHOS: 206 U/L — AB (ref 38–126)
ALT: 22 U/L (ref 17–63)
ANION GAP: 15 (ref 5–15)
AST: 40 U/L (ref 15–41)
BUN: 10 mg/dL (ref 6–20)
CHLORIDE: 96 mmol/L — AB (ref 101–111)
CO2: 23 mmol/L (ref 22–32)
Calcium: 8.8 mg/dL — ABNORMAL LOW (ref 8.9–10.3)
Creatinine, Ser: 1.47 mg/dL — ABNORMAL HIGH (ref 0.61–1.24)
GFR calc Af Amer: >60 mL/min (ref >60)
GFR calc non Af Amer: 53 mL/min — ABNORMAL LOW (ref >60)
GLUCOSE: 782 mg/dL — AB (ref 65–99)
POTASSIUM: 5.5 mmol/L — AB (ref 3.5–5.1)
SODIUM: 134 mmol/L — AB (ref 135–145)
Total Bilirubin: 0.3 mg/dL (ref 0.3–1.2)
Total Protein: 7.4 g/dL (ref 6.5–8.1)

## 2017-07-10 LAB — URINALYSIS, ROUTINE W REFLEX MICROSCOPIC
BILIRUBIN URINE: NEGATIVE
Bacteria, UA: NONE SEEN
Hgb urine dipstick: NEGATIVE
KETONES UR: NEGATIVE mg/dL
LEUKOCYTES UA: NEGATIVE
Nitrite: NEGATIVE
PROTEIN: NEGATIVE mg/dL
RBC / HPF: NONE SEEN RBC/hpf (ref 0–5)
SQUAMOUS EPITHELIAL / LPF: NONE SEEN
Specific Gravity, Urine: 1.021 (ref 1.005–1.030)
pH: 5 (ref 5.0–8.0)

## 2017-07-10 LAB — ETHANOL: ALCOHOL ETHYL (B): 260 mg/dL — AB (ref <10)

## 2017-07-10 LAB — RAPID URINE DRUG SCREEN, HOSP PERFORMED
AMPHETAMINES: NOT DETECTED
BENZODIAZEPINES: NOT DETECTED
Barbiturates: NOT DETECTED
Cocaine: NOT DETECTED
OPIATES: NOT DETECTED
Tetrahydrocannabinol: NOT DETECTED

## 2017-07-10 LAB — BASIC METABOLIC PANEL
ANION GAP: 12 (ref 5–15)
BUN: 8 mg/dL (ref 6–20)
CALCIUM: 8.4 mg/dL — AB (ref 8.9–10.3)
CO2: 22 mmol/L (ref 22–32)
Chloride: 107 mmol/L (ref 101–111)
Creatinine, Ser: 1.13 mg/dL (ref 0.61–1.24)
GFR calc Af Amer: >60 mL/min (ref >60)
GLUCOSE: 186 mg/dL — AB (ref 65–99)
POTASSIUM: 3.8 mmol/L (ref 3.5–5.1)
SODIUM: 141 mmol/L (ref 135–145)

## 2017-07-10 LAB — CBG MONITORING, ED
GLUCOSE-CAPILLARY: 156 mg/dL — AB (ref 65–99)
Glucose-Capillary: 597 mg/dL (ref 65–99)
Glucose-Capillary: 408 mg/dL — ABNORMAL HIGH (ref 65–99)
Glucose-Capillary: 253 mg/dL — ABNORMAL HIGH (ref 65–99)

## 2017-07-10 LAB — CK: CK TOTAL: 203 U/L (ref 49–397)

## 2017-07-10 LAB — GAMMA GT: GGT: 379 U/L — ABNORMAL HIGH (ref 7–50)

## 2017-07-10 LAB — VALPROIC ACID LEVEL

## 2017-07-10 LAB — AMMONIA: AMMONIA: 39 umol/L — AB (ref 9–35)

## 2017-07-10 LAB — GLUCOSE, CAPILLARY: Glucose-Capillary: 223 mg/dL — ABNORMAL HIGH (ref 65–99)

## 2017-07-10 MED ORDER — SODIUM CHLORIDE 0.9 % IV BOLUS (SEPSIS)
1000.0000 mL | Freq: Once | INTRAVENOUS | Status: AC
  Administered 2017-07-10: 1000 mL via INTRAVENOUS

## 2017-07-10 MED ORDER — ENOXAPARIN SODIUM 40 MG/0.4ML ~~LOC~~ SOLN
40.0000 mg | SUBCUTANEOUS | Status: DC
Start: 2017-07-10 — End: 2017-07-11
  Administered 2017-07-10: 40 mg via SUBCUTANEOUS
  Filled 2017-07-10: qty 0.4

## 2017-07-10 MED ORDER — ATORVASTATIN CALCIUM 80 MG PO TABS
80.0000 mg | ORAL_TABLET | Freq: Every day | ORAL | Status: DC
  Administered 2017-07-10: 80 mg via ORAL
  Filled 2017-07-10: qty 1

## 2017-07-10 MED ORDER — VITAMIN B-1 100 MG PO TABS
100.0000 mg | ORAL_TABLET | Freq: Every day | ORAL | Status: DC
  Administered 2017-07-10 – 2017-07-11 (×2): 100 mg via ORAL
  Filled 2017-07-10 (×2): qty 1

## 2017-07-10 MED ORDER — THIAMINE HCL 100 MG/ML IJ SOLN: 100.0000 mg | Freq: Every day | INTRAMUSCULAR | Status: DC

## 2017-07-10 MED ORDER — INSULIN GLARGINE 100 UNIT/ML ~~LOC~~ SOLN
12.0000 [IU] | Freq: Every day | SUBCUTANEOUS | Status: DC
  Administered 2017-07-10: 12 [IU] via SUBCUTANEOUS
  Filled 2017-07-10 (×2): qty 0.12

## 2017-07-10 MED ORDER — ASPIRIN EC 81 MG PO TBEC
81.0000 mg | DELAYED_RELEASE_TABLET | Freq: Every day | ORAL | Status: DC
  Administered 2017-07-10: 81 mg via ORAL
  Filled 2017-07-10: qty 1

## 2017-07-10 MED ORDER — SODIUM CHLORIDE 0.9 % IV SOLN
INTRAVENOUS | Status: DC
  Filled 2017-07-10: qty 1

## 2017-07-10 MED ORDER — PANTOPRAZOLE SODIUM 40 MG PO TBEC
40.0000 mg | DELAYED_RELEASE_TABLET | Freq: Every day | ORAL | Status: DC
  Administered 2017-07-10 – 2017-07-11 (×2): 40 mg via ORAL
  Filled 2017-07-10 (×2): qty 1

## 2017-07-10 MED ORDER — IRBESARTAN 300 MG PO TABS
300.0000 mg | ORAL_TABLET | Freq: Every day | ORAL | Status: DC
  Administered 2017-07-10 – 2017-07-11 (×2): 300 mg via ORAL
  Filled 2017-07-10 (×2): qty 1

## 2017-07-10 MED ORDER — SODIUM CHLORIDE 0.9 % IV SOLN
INTRAVENOUS | Status: DC
  Administered 2017-07-10 – 2017-07-11 (×3): via INTRAVENOUS

## 2017-07-10 MED ORDER — DIVALPROEX SODIUM ER 500 MG PO TB24
500.0000 mg | ORAL_TABLET | Freq: Two times a day (BID) | ORAL | Status: DC
  Administered 2017-07-10 – 2017-07-11 (×3): 500 mg via ORAL
  Filled 2017-07-10 (×3): qty 1

## 2017-07-10 MED ORDER — FOLIC ACID 1 MG PO TABS
1.0000 mg | ORAL_TABLET | Freq: Every day | ORAL | Status: DC
  Administered 2017-07-10 – 2017-07-11 (×2): 1 mg via ORAL
  Filled 2017-07-10 (×2): qty 1

## 2017-07-10 MED ORDER — LORAZEPAM 1 MG PO TABS: 1.0000 mg | ORAL_TABLET | Freq: Four times a day (QID) | ORAL | Status: DC | PRN

## 2017-07-10 MED ORDER — LACTULOSE 10 GM/15ML PO SOLN
10.0000 g | Freq: Every day | ORAL | Status: DC
  Administered 2017-07-11: 10 g via ORAL
  Filled 2017-07-10: qty 15

## 2017-07-10 MED ORDER — LORAZEPAM 2 MG/ML IJ SOLN: 1.0000 mg | Freq: Four times a day (QID) | INTRAMUSCULAR | Status: DC | PRN

## 2017-07-10 MED ORDER — INSULIN ASPART 100 UNIT/ML ~~LOC~~ SOLN
15.0000 [IU] | Freq: Once | SUBCUTANEOUS | Status: AC
  Administered 2017-07-10: 15 [IU] via SUBCUTANEOUS
  Filled 2017-07-10: qty 1

## 2017-07-10 MED ORDER — METOPROLOL TARTRATE 100 MG PO TABS
100.0000 mg | ORAL_TABLET | Freq: Two times a day (BID) | ORAL | Status: DC
  Administered 2017-07-10 – 2017-07-11 (×3): 100 mg via ORAL
  Filled 2017-07-10: qty 4
  Filled 2017-07-10 (×2): qty 1

## 2017-07-10 MED ORDER — ADULT MULTIVITAMIN W/MINERALS CH
1.0000 | ORAL_TABLET | Freq: Every day | ORAL | Status: DC
  Administered 2017-07-10 – 2017-07-11 (×2): 1 via ORAL
  Filled 2017-07-10 (×2): qty 1

## 2017-07-10 MED ORDER — INSULIN ASPART 100 UNIT/ML ~~LOC~~ SOLN
0.0000 [IU] | Freq: Three times a day (TID) | SUBCUTANEOUS | Status: DC
  Administered 2017-07-10 – 2017-07-11 (×3): 3 [IU] via SUBCUTANEOUS
  Filled 2017-07-10: qty 1

## 2017-07-10 MED ORDER — HYDROCHLOROTHIAZIDE 25 MG PO TABS
25.0000 mg | ORAL_TABLET | Freq: Every day | ORAL | Status: DC
  Administered 2017-07-10: 25 mg via ORAL
  Filled 2017-07-10: qty 1

## 2017-07-10 NOTE — ED Notes (Signed)
Admitting team paged to inform of CBG and advice on insulin admin.

## 2017-07-10 NOTE — ED Notes (Signed)
Dr. Christy Gentles notified on pt.'s elevated blood glucose.

## 2017-07-10 NOTE — ED Triage Notes (Signed)
Patient found on the floor by spouse at home this evening , pt. unable to recall his fall , denies pain , alert /disoriented to time , CBG = 599 by EMS . Respirations unlabored .

## 2017-07-10 NOTE — ED Notes (Signed)
Patient transported to CT scan . 

## 2017-07-10 NOTE — ED Notes (Signed)
Pharmacy notified on pt.'s insulin drip order.

## 2017-07-10 NOTE — H&P (Signed)
[facility] Admission History and Physical Service Pager: [phone]  Patient name: Jeffrey [name] Medical record number: [id number] Date of birth: Jul 26, 1966 Age: 51 y.o. Gender: male  Primary Care Provider: Sela Hilding, MD Consultants: None Code Status: Full   Chief Complaint: AMS, hyperglycemia, & slurred speech   Assessment and Plan: Jeffrey [name] is a 51 y.o. male presenting with AMS, hyperglycemia, and slurred speech . PMH is significant for T2DM, HTN, alcohol abuse, OSA on CPAP, HLD, Diabetic neuropathy, Anxiety, Depression, AKI, and hepatic cirrhosis.   AMS, resolved. Patient recalls drinking a large amount of alcohol yesterday and was found on floor by wife last night. Does not recall a fall though CT in ED showing a small left posterior scalp hematoma. AMS resolved on presentation. Likely 2/2 alcohol intoxication.  Ethanol level in ED found to be elevated to 260. UDS negative so unlikely drug related. Ammonia slightly elevated to 39, so less likely due to elevated ammonia. Possible contribution of HHS. -admit to observation, attending Dr. Gwendlyn Deutscher -fluid hydration -continue to monitor neuro status  -thiamine -multivitamin  -CIWA protocol  Hyperglycemia with history of poorly controlled T2DM Glucose on presentation 782, with most recent CBG of 597 after fluid bolus x2. Concerning for HHS given bicarb >20 but neurologic abnormalities more likely d/t EtOH intoxication and no UA available for evaluation of ketones. Patient has history of poor diabetes control and compliance. Patient states that he has been taking lantus daily. Last a1c 12.6 on 06/10/17. -novolog 15U x1 with recheck in 1 hour  -check UA -moderate sliding scale -continue lantus at night   Slurred speech  Has had slurred speech with word finding difficulty over the past 2 weeks Likely 2/2 alcohol intoxication along with non-compliance with lactulose.  -continue to  monitor -negative CT so unlikely acute stroke -continue lactulose -continue to monitor neuro status   Hyperlipidemia Home atorvastatin 80 mg -continue home medications   HTN Home hydrochlorothiazide, metoprolol, and benicar. Most recent BP of 160/103.  -continue home medications   Alcohol abuse History of alcohol abuse. Patient reports heavy drinking yesterday afternoon but unable to determine how many beers consumed.  -CIWA protocol   Diabetic neuropathy Home gabapentin. -hold home gabapentin given AMS  Hepatic cirrhosis Patient prescribed lactulose but states he is noncompliant. Ammonia on admission slightly elevated to 39.  -continue home medications  -monitor neuro status  AKI Creatinine on admission of 1.47. Baseline appears to be ~1.2 from previous admission notes.  -continue to monitor   Psych History or anxiety and depression. Home medications of depakote.   -continue home medications   FEN/GI: NPO Prophylaxis: lovenox   Disposition: admit to observation, attending Dr. Gwendlyn Deutscher   History of Present Illness:  Jeffrey [name] is a 51 y.o. male presenting with AMS,  hyperglycemia and slurred speech. Patient was found down by spouse this morning. Patient is unable to recall events leading to fall and directly after. The last thing he remembers was going to sleep and "was at peace". States woke up on floor, at which time he was found by wife. Patient states he felt like he was "in the wrong place". Patient states he was drinking a lot of beer yesterday afternoon with a friend prior to falling asleep. Had been feeling well, no recent illness or nausea vomiting or fever/chills. Patient states that all his symptoms began to appear 2 weeks ago beginning with slurred speech. Patient also states he couldn't use hand and notes he has been  confused about what to do.  He states he has not been taking lactulose.   While in ED patient was awake and alert but smelled ketotic to ED  physician. Patient had an initial CBG of >600 and repeat of 597. Patient did not have an anion gap and bicarbonate of 23 so patient was not seen to be in active DKA. Patient was also found to have slurred speech and head CT was obtained showing a small left posterior scalp hematoma. Ethanol level in ED found to be elevated to 260.   Patient was seen in PCP office on 9/27 at which time patient was advised to take lantus 12 units every morning, apidra 2 units twice daily with glucerna as long as CBG > 200, 5 units with evening meal as long as CBG > 100 and told to follow up in 1-2 weeks with PCP. Patient also having slurred speech at that time and recommended for noncontrast head CT and to begin taking folic acid and thiamine along with lactulose. Patient stated that he does not take lactulose due to frequent need to have bowel movements while on lactulose.   Review Of Systems: Per HPI with the following additions:   Review of Systems  Constitutional: Negative for chills and fever.  HENT: Negative for ear pain, hearing loss and tinnitus.   Eyes: Negative for blurred vision and double vision.  Respiratory: Negative for shortness of breath.   Cardiovascular: Negative for chest pain and palpitations.  Gastrointestinal: Negative for abdominal pain, constipation, diarrhea, nausea and vomiting.  Genitourinary: Negative for dysuria, frequency and urgency.  Neurological: Positive for speech change (over the last 2 weeks). Negative for focal weakness and headaches.  Psychiatric/Behavioral: Negative for hallucinations and substance abuse.    Patient Active Problem List   Diagnosis Date Noted  . Speech disturbance 07/03/2017  . DKA (diabetic ketoacidoses) ([place]) 06/09/2017  . Hyponatremia 04/08/2017  . Seizure disorder ([place]) 04/08/2017  . Depression 12/17/2015  . Hip pain 12/17/2015  . Proteinuria 11/23/2015  . Loss of teeth 11/23/2015  . Essential hypertension   . Diabetic polyneuropathy associated  with type 2 diabetes mellitus ([place]) 08/04/2015  . Hepatic cirrhosis ([place]) 03/22/2015  . Ataxia 03/20/2015  . Aphasia   . Elevated liver enzymes   . Dysarthria 03/09/2015  . AKI (acute kidney injury) ([place])   . Alcohol intoxication ([place])   . Erosive esophagitis 10/10/2014  . HLD (hyperlipidemia)   . Passed out [facility]) 10/08/2014  . Syncope and collapse   . OSA on CPAP 12/28/2012  . Pulmonary edema 12/08/2012  . Cardiac arrest ([place]) 12/07/2012  . Obesity 12/07/2012  . Anoxic brain injury ([place]) 12/07/2012  . GI bleed 11/26/2012  . Venous insufficiency 12/13/2011  . 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 ALCOHOL ABUSE, HX OF 11/10/2007  . ERECTILE DYSFUNCTION 04/14/2007  . Type 2 diabetes mellitus with hemoglobin A1c goal of less than 7.5% ([place]) 12/04/2006  . HYPERCHOLESTEROLEMIA 12/04/2006  . OBESITY, NOS 12/04/2006  . Anxiety state 12/04/2006  . HYPERTENSION, BENIGN SYSTEMIC 12/04/2006  . ARTHRITIS 12/04/2006    Past Medical History: Past Medical History:  Diagnosis Date  . Acute renal insufficiency 12/08/2012  . AKI (acute kidney injury) ([place])   . ALCOHOL ABUSE, HX OF 11/10/2007  . Anoxic encephalopathy ([place])   . Anxiety   . Bleeding ulcer 2014  . Blood transfusion 2014   "related to bleeding ulcer"  . Cardiac arrest ([place]) 12/07/2012   Anoxic encephalopathy  . GERD (  gastroesophageal reflux disease)   . High cholesterol   . Hypertension   . OSA on CPAP 12/07/2012  . Seizure-like activity ([place])   . Type II diabetes mellitus ([place])   . Venous insufficiency 12/13/2011    Past Surgical History: Past Surgical History:  Procedure Laterality Date  . CARDIOVASCULAR STRESS TEST  03/16/13   Very Poor Exercise Tolerance; NON DIAGNOSTIC TEST  . CATARACT EXTRACTION W/ INTRAOCULAR LENS IMPLANT Left 03/07/2014   [place] @ [facility]  . ESOPHAGOGASTRODUODENOSCOPY Left 11/26/2012   Procedure: ESOPHAGOGASTRODUODENOSCOPY (EGD);  Surgeon: Wonda Horner, MD;   Location: [facility] ENDOSCOPY;  Service: Endoscopy;  Laterality: Left;  . ESOPHAGOGASTRODUODENOSCOPY N/A 10/10/2014   Procedure: ESOPHAGOGASTRODUODENOSCOPY (EGD);  Surgeon: Ladene Artist, MD;  Location[facility] ENDOSCOPY;  Service: Endoscopy;  Laterality: N/A;  . KNEE ARTHROSCOPY Left ~ 1999    Social History: Social History  Substance Use Topics  . Smoking status: Never Smoker  . Smokeless tobacco: Current User    Types: Snuff  . Alcohol use 3.6 oz/week    6 Cans of beer per week   Additional social history: Denies recreational drug use. Please also refer to relevant sections of EMR.  Family History: Family History  Problem Relation Age of Onset  . Other Mother        Unsure of medical history.  . Diabetes Mellitus II Father     Allergies and Medications: Allergies  Allergen Reactions  . Lisinopril Other (See Comments)    Pt reports nose bleed.   No current facility-administered medications on file prior to encounter.    Current Outpatient Prescriptions on File Prior to Encounter  Medication Sig Dispense Refill  . aspirin EC 81 MG tablet Take 81 mg by mouth at bedtime.     [name] atorvastatin (LIPITOR) 80 MG tablet Take 1 tablet (80 mg total) by mouth daily. (Patient taking differently: Take 80 mg by mouth at bedtime. ) 30 tablet 3  . divalproex (DEPAKOTE ER) 500 MG 24 hr tablet Take 1 tablet (500 mg total) by mouth 2 (two) times daily. 180 tablet 0  . feeding supplement, GLUCERNA SHAKE, (GLUCERNA SHAKE) LIQD Take 237 mLs by mouth 2 (two) times daily between meals. 425 mL 14  . folic acid (FOLVITE) 1 MG tablet Take 1 tablet (1 mg total) by mouth daily. 30 tablet 12  . gabapentin (NEURONTIN) 300 MG capsule Take 1 capsule (300 mg total) by mouth 4 (four) times daily. 360 capsule 0  . hydrochlorothiazide (HYDRODIURIL) 25 MG tablet Take 1 tablet (25 mg total) by mouth daily. (Patient taking differently: Take 25 mg by mouth at bedtime. ) 45 tablet 0  . Insulin Glargine (LANTUS) 100 UNIT/ML  Solostar Pen Inject 12 Units into the skin daily before breakfast. 15 mL 3  . Insulin Glulisine (APIDRA SOLOSTAR) 100 UNIT/ML Solostar Pen Take 2 units two times a day with your Glucerna as long as your blood sugar is higher than 200. Take 5 units with your evening meal as long as your blood sugar is higher than 100. 15 mL 3  . lactulose (CHRONULAC) 10 GM/15ML solution Take 15 mLs (10 g total) by mouth daily. 473 mL 3  . metoprolol (LOPRESSOR) 100 MG tablet TAKE 1 Tablet  BY MOUTH TWICE DAILY (Patient taking differently: TAKE 100 mg Tablet  BY MOUTH TWICE DAILY) 90 tablet 3  . Multiple Vitamin (MULTIVITAMIN WITH MINERALS) TABS tablet Take 1 tablet by mouth at bedtime.     . naproxen (NAPROSYN) 500  MG tablet Take 1 tablet (500 mg total) by mouth 2 (two) times daily with a meal. (Patient taking differently: Take 500 mg by mouth daily. ) 60 tablet 0  . olmesartan (BENICAR) 40 MG tablet Take 1 tablet (40 mg total) by mouth daily. (Patient taking differently: Take 40 mg by mouth at bedtime. ) 90 tablet 2  . omeprazole (PRILOSEC) 20 MG capsule Take 1 capsule (20 mg total) by mouth daily. (Patient taking differently: Take 40 mg by mouth daily. ) 30 capsule 3  . polyvinyl alcohol (LIQUIFILM TEARS) 1.4 % ophthalmic solution Place 1 drop into both eyes every 4 (four) hours as needed for dry eyes. 15 mL 2  . tadalafil (CIALIS) 10 MG tablet Take 1 tablet (10 mg total) by mouth daily as needed for erectile dysfunction. 10 tablet 0  . thiamine (VITAMIN B-1) 100 MG tablet Take 1 tablet (100 mg total) by mouth daily. 30 tablet 12  . cyclobenzaprine (FLEXERIL) 10 MG tablet Take 1 tablet (10 mg total) by mouth 3 (three) times daily as needed for muscle spasms. (Patient not taking: Reported on 06/09/2017) 30 tablet 0    Objective: BP (!) 153/101   Pulse 83   Temp 98.6 F (37 C)   Resp 18   SpO2 99%  Exam: General: AAOx3, NAD, slurring speech, able to follow commands  Eyes: PERRL, EOMI, no conjunctival pallor   ENTM: moist mucous membranes  Cardiovascular: RRR, no MRG Respiratory: CTAB, no wheezes, rales or rhonchi  Gastrointestinal: soft, non distended, non tender, bowel sounds x 4 quadrants  MSK: 5/5 muscle strength bilaterally in all extremities, normal grip strength, no edema, non tender  Derm: intact, no rashes  Neuro: CN 2-12 intact, no finger to nose dysmetria, sensation intact bilaterally in face and all extremities, slurring speech, able to follow commands  Psych: normal affect  Labs and Imaging: CBC BMET   Recent Labs Lab 07/10/17 0413  WBC 7.0  HGB 11.8*  HCT 35.9*  PLT 286    Recent Labs Lab 07/10/17 0413  NA 134*  K 5.5*  CL 96*  CO2 23  BUN 10  CREATININE 1.47*  GLUCOSE 782*  CALCIUM 8.8*     CBG (last 3)   Recent Labs  07/10/17 0335 07/10/17 0629  GLUCAP >600* 597*    Ref. Range 07/10/2017 03:42  Alcohol, Ethyl (B) Latest Ref Range: <10 mg/dL 260 (H)     Ref. Range 07/10/2017 03:42  Alcohol, Ethyl (B) Latest Ref Range: <10 mg/dL 260 (H)     Ref. Range 07/10/2017 03:42  Ammonia Latest Ref Range: 9 - 35 umol/L 39 (H)    Ct Head Wo Contrast  Result Date: 07/10/2017 CLINICAL DATA:  Found down, altered mental status. History of anoxic encephalopathy, alcohol abuse, cardiac arrest. EXAM: CT HEAD WITHOUT CONTRAST TECHNIQUE: Contiguous axial images were obtained from the base of the skull through the vertex without intravenous contrast. COMPARISON:  MRI of the head August 27, 2016 FINDINGS: BRAIN: No intraparenchymal hemorrhage, mass effect nor midline shift. Mild to moderate parenchymal brain volume loss, no hydrocephalus. Advanced vermian atrophy. No acute large vascular territory infarcts. No abnormal extra-axial fluid collections. Basal cisterns are patent. VASCULAR: Trace calcific atherosclerosis of the carotid siphons. SKULL: No skull fracture. Small LEFT parietal scalp hematoma without subcutaneous gas or radiopaque foreign bodies. SINUSES/ORBITS: The  mastoid air-cells and included paranasal sinuses are well-aerated.The included ocular globes and orbital contents are non-suspicious. LEFT ocular lens implant. OTHER: None. IMPRESSION: 1. Small LEFT posterior  scalp hematoma. No skull fracture. No acute intracranial process. 2. Stable mild-to-moderate parenchymal brain volume loss with disproportionate vermian atrophy. Electronically Signed   By: Elon Alas M.D.   On: 07/10/2017 05:05    Caroline More, DO 07/10/2017, 7:32 AM PGY-1, [place] Intern pager: [phone], text pages welcome  FPTS Upper-Level Resident Addendum  I have independently interviewed and examined the patient. I have discussed the above with the original author and agree with their documentation. My edits for correction/addition/clarification are in blue. Please see also any attending notes.   Bufford Lope, DO PGY-2, [facility] 07/10/2017 8:24 AM  FPTS Service pager: [phone] (text pages welcome through [facility])

## 2017-07-10 NOTE — Progress Notes (Signed)
Received report on pt.

## 2017-07-10 NOTE — Progress Notes (Signed)
Family Medicine Teaching Service Daily Progress Note Intern Pager: [phone]  Patient name: Jeffrey [name] Medical record number: [id number] Date of birth: 18-May-1966 Age: 51 y.o. Gender: male  Primary Care Provider: Sela Hilding, MD Consultants: None Code Status: Full   Pt Overview and Major Events to Date:  Admitted to [facility] on 10/4   Assessment and Plan: Jeffrey [name] is a 51 y.o. male presenting with AMS, hyperglycemia, and slurred speech . PMH is significant for T2DM, HTN, alcohol abuse, OSA on CPAP, HLD, Diabetic neuropathy, Anxiety, Depression, AKI, and hepatic cirrhosis.   AMS-resolved. Patient recalls drinking a large amount of alcohol prior to admission and was found on floor by wife. Does not recall a fall though CT in ED showing a small left posterior scalp hematoma. AMS resolved on presentation. Likely 2/2 alcohol intoxication.  Ethanol level in ED found to be elevated to 260. UDS negative so unlikely drug related. Ammonia slightly elevated to 39, so less likely due to elevated ammonia. Possible contribution of HHS. Patient today is AAOx3. States he is much improved. CIWA scores overnight recorded as 2.  -continue fluid hydration -continue to monitor neuro status  -thiamine -multivitamin  -CIWA protocol  Hyperglycemia with history of poorly controlled T2DM Glucose on presentation 782, with most recent CBG of 597 after fluid bolus x2. Concerning for HHS given bicarb >20 but neurologic abnormalities more likely d/t EtOH intoxication. Patient has history of poor diabetes control and compliance. Patient states that he has been taking lantus daily. Last a1c 12.6 on 06/10/17. UA on admission showing elevated glucose of 500 but no ketones present. Early this morning patient had asymptomatic hypoglycemic episode of CBG of 61. Patient was given orange juice and CBG improved. Patient stated he chose not eat at all yesterday and this may be why his glucose dropped. Most recent CBG  of 108.   -moderate sliding scale -continue lantus at night   Slurred speech, improving  Has had slurred speech with word finding difficulty over the past 2 weeks. Increased slurring on presentation likely 2/2 alcohol intoxication along with non-compliance with lactulose. Patient states he now is able to finish sentences without as much difficulty and notices improvement with slurring of speech. Speech much more clear today compared to yesterday. -continue to monitor -negative CT so unlikely acute stroke -continue lactulose -continue to monitor neuro status  -consider MRI if worsening symptoms   Hyperlipidemia Home atorvastatin 80 mg -continue home medications   HTN Home hydrochlorothiazide, metoprolol, and benicar. Most recent BP of 141/86.  -continue home medications   Alcohol abuse History of alcohol abuse. Patient reports heavy drinking yesterday afternoon but unable to determine how many beers consumed. CIWA scores overnight of 2.  -CIWA protocol   Diabetic neuropathy Home gabapentin. -hold home gabapentin given AMS, consider restarting on discharge   Hepatic cirrhosis Patient prescribed lactulose but states he is noncompliant. Ammonia on admission slightly elevated to 39 on admission.  -continue home medications  -monitor neuro status  AKI-resolved Creatinine on admission of 1.47. Baseline appears to be ~1.2 from previous admission notes. Current Cr of 1.05.  -continue to monitor   Psych History or anxiety and depression. Home medications of depakote.             -continue home medications   Seizures Per chart review, history of seizures listed in appointment on 9/20 with Dr. Valentina Lucks. Patient currently on gabapentin and valproic acid. Most recent valproic acid level subtherapeutic on 9/20.  -follow up as outpatient with  neurology -continue depakote  -holding gabapentin given AMS, consider restarting on discharge.   -monitor valproic acid levels as outpatient  to ensure therapeutic levels   FEN/GI: NPO Prophylaxis: lovenox   Disposition: discharge to home   Subjective:  Patient today states he is feeling much better and is anxious to go home. Patient denies any further altered mental status and notes significant improvement in slurred speech. Patient denies pain.   Objective: Temp:  [98.2 F (36.8 C)-99 F (37.2 C)] 98.6 F (37 C) (10/05 0539) Pulse Rate:  [65-79] 68 (10/05 0850) Resp:  [16-18] 16 (10/05 0539) BP: (115-143)/(74-94) 141/86 (10/05 0539) SpO2:  [98 %-100 %] 100 % (10/05 0539) Physical Exam: General: awake and alert, laying in bed watching television  Cardiovascular: RRR, no MRG Respiratory: CTAB, no wheezes, rales or rhonchi  Abdomen: soft, non tender, non distended, bowel sounds x 4 quadrants  Extremities: 5/5 muscle strength bilaterally in all 4 extremities Neuro: AAOx3, CN2-12 intact, 5/5 muscle strength bilaterally, normal grip strength, equal sensation bilaterally, minimal slurring of speech   Laboratory:  Recent Labs Lab 07/10/17 0413  WBC 7.0  HGB 11.8*  HCT 35.9*  PLT 286    Recent Labs Lab 07/10/17 0413 07/10/17 1046 07/11/17 0528  NA 134* 141 139  K 5.5* 3.8 3.5  CL 96* 107 106  CO2 23 22 22   BUN 10 8 7   CREATININE 1.47* 1.13 1.05  CALCIUM 8.8* 8.4* 8.0*  PROT 7.4  --   --   BILITOT 0.3  --   --   ALKPHOS 206*  --   --   ALT 22  --   --   AST 40  --   --   GLUCOSE 782* 186* 115*    CBG (last 3)   Recent Labs  07/11/17 0808 07/11/17 0825 07/11/17 0910  GLUCAP 48* 61* 108*     Imaging/Diagnostic Tests: Ct Head Wo Contrast  Result Date: 07/10/2017 CLINICAL DATA:  Found down, altered mental status. History of anoxic encephalopathy, alcohol abuse, cardiac arrest. EXAM: CT HEAD WITHOUT CONTRAST TECHNIQUE: Contiguous axial images were obtained from the base of the skull through the vertex without intravenous contrast. COMPARISON:  MRI of the head August 27, 2016 FINDINGS: BRAIN: No  intraparenchymal hemorrhage, mass effect nor midline shift. Mild to moderate parenchymal brain volume loss, no hydrocephalus. Advanced vermian atrophy. No acute large vascular territory infarcts. No abnormal extra-axial fluid collections. Basal cisterns are patent. VASCULAR: Trace calcific atherosclerosis of the carotid siphons. SKULL: No skull fracture. Small LEFT parietal scalp hematoma without subcutaneous gas or radiopaque foreign bodies. SINUSES/ORBITS: The mastoid air-cells and included paranasal sinuses are well-aerated.The included ocular globes and orbital contents are non-suspicious. LEFT ocular lens implant. OTHER: None. IMPRESSION: 1. Small LEFT posterior scalp hematoma. No skull fracture. No acute intracranial process. 2. Stable mild-to-moderate parenchymal brain volume loss with disproportionate vermian atrophy. Electronically Signed   By: Elon Alas M.D.   On: 07/10/2017 05:05    Caroline More, DO 07/11/2017, 11:09 AM PGY-1, [facility] Intern pager: [phone], text pages welcome

## 2017-07-10 NOTE — ED Notes (Signed)
Admitting MD at bedside.

## 2017-07-10 NOTE — ED Provider Notes (Signed)
[facility] DEPT Provider Note   CSN: [id number] Arrival date & time: 07/10/17  6378     History   Chief Complaint Chief Complaint  Patient presents with  . Fall  . Hyperglycemia   LEVEL 5 CAVEAT DUE TO ALTERED MENTAL STATUS  HPI Jeffrey [name] is a 51 y.o. male.  The history is provided by the patient.  Fall  This is a new problem. Episode onset: unknown. The problem has not changed since onset.Pertinent negatives include no headaches. Nothing aggravates the symptoms. Nothing relieves the symptoms.  Hyperglycemia  pt presents with fall, hyperglycemia and altered mental status Per EMS, pt found in floor by spouse Unclear what caused fall Pt can not recall any details of the event tonight He denies any pain at this time He reports recent slurred speech and feeling weak but this is not acute tonight   Past Medical History:  Diagnosis Date  . Acute renal insufficiency 12/08/2012  . AKI (acute kidney injury) ([place])   . ALCOHOL ABUSE, HX OF 11/10/2007  . Anoxic encephalopathy ([place])   . Anxiety   . Bleeding ulcer 2014  . Blood transfusion 2014   "related to bleeding ulcer"  . Cardiac arrest ([place]) 12/07/2012   Anoxic encephalopathy  . GERD (gastroesophageal reflux disease)   . High cholesterol   . Hypertension   . OSA on CPAP 12/07/2012  . Seizure-like activity ([place])   . Type II diabetes mellitus ([place])   . Venous insufficiency 12/13/2011    Patient Active Problem List   Diagnosis Date Noted  . Speech disturbance 07/03/2017  . DKA (diabetic ketoacidoses) ([place]) 06/09/2017  . Hyponatremia 04/08/2017  . Seizure disorder ([place]) 04/08/2017  . Depression 12/17/2015  . Hip pain 12/17/2015  . Proteinuria 11/23/2015  . Loss of teeth 11/23/2015  . Essential hypertension   . Diabetic polyneuropathy associated with type 2 diabetes mellitus ([place]) 08/04/2015  . Hepatic cirrhosis ([place]) 03/22/2015  . Ataxia 03/20/2015  . Aphasia   . Elevated liver enzymes   . Dysarthria  03/09/2015  . AKI (acute kidney injury) ([place])   . Alcohol intoxication ([place])   . Erosive esophagitis 10/10/2014  . HLD (hyperlipidemia)   . Passed out [facility]) 10/08/2014  . Syncope and collapse   . OSA on CPAP 12/28/2012  . Pulmonary edema 12/08/2012  . Cardiac arrest ([place]) 12/07/2012  . Obesity 12/07/2012  . Anoxic brain injury ([place]) 12/07/2012  . GI bleed 11/26/2012  . Venous insufficiency 12/13/2011  . 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 ALCOHOL ABUSE, HX OF 11/10/2007  . ERECTILE DYSFUNCTION 04/14/2007  . Type 2 diabetes mellitus with hemoglobin A1c goal of less than 7.5% ([place]) 12/04/2006  . HYPERCHOLESTEROLEMIA 12/04/2006  . OBESITY, NOS 12/04/2006  . Anxiety state 12/04/2006  . HYPERTENSION, BENIGN SYSTEMIC 12/04/2006  . ARTHRITIS 12/04/2006    Past Surgical History:  Procedure Laterality Date  . CARDIOVASCULAR STRESS TEST  03/16/13   Very Poor Exercise Tolerance; NON DIAGNOSTIC TEST  . CATARACT EXTRACTION W/ INTRAOCULAR LENS IMPLANT Left 03/07/2014   [place] @ [facility]  . ESOPHAGOGASTRODUODENOSCOPY Left 11/26/2012   Procedure: ESOPHAGOGASTRODUODENOSCOPY (EGD);  Surgeon: Wonda Horner, MD;  Location: [facility] ENDOSCOPY;  Service: Endoscopy;  Laterality: Left;  . ESOPHAGOGASTRODUODENOSCOPY N/A 10/10/2014   Procedure: ESOPHAGOGASTRODUODENOSCOPY (EGD);  Surgeon: Ladene Artist, MD;  Location: [facility] ENDOSCOPY;  Service: Endoscopy;  Laterality: N/A;  . KNEE ARTHROSCOPY Left ~ 1999       Home Medications  Prior to Admission medications   Medication Sig Start Date End Date Taking? Authorizing Provider  aspirin EC 81 MG tablet Take 81 mg by mouth at bedtime.     [provider]  atorvastatin (LIPITOR) 80 MG tablet Take 1 tablet (80 mg total) by mouth daily. Patient taking differently: Take 80 mg by mouth at bedtime.  10/10/16   Jeffrey Resides, MD  cyclobenzaprine (FLEXERIL) 10 MG tablet Take 1 tablet (10 mg total) by mouth 3 (three)  times daily as needed for muscle spasms. Patient not taking: Reported on 06/09/2017 03/26/17   Lorna Few, DO  divalproex (DEPAKOTE ER) 500 MG 24 hr tablet Take 1 tablet (500 mg total) by mouth 2 (two) times daily. 06/26/17   Kinnie Feil, MD  feeding supplement, GLUCERNA SHAKE, (GLUCERNA SHAKE) LIQD Take 237 mLs by mouth 2 (two) times daily between meals. 06/12/17   Smiley Houseman, MD  folic acid (FOLVITE) 1 MG tablet Take 1 tablet (1 mg total) by mouth daily. 05/27/16   [name], DO  gabapentin (NEURONTIN) 300 MG capsule Take 1 capsule (300 mg total) by mouth 4 (four) times daily. 06/26/17   Kinnie Feil, MD  hydrochlorothiazide (HYDRODIURIL) 25 MG tablet Take 1 tablet (25 mg total) by mouth daily. Patient taking differently: Take 25 mg by mouth at bedtime.  05/08/17   Sela Hilding, MD  Insulin Glargine (LANTUS) 100 UNIT/ML Solostar Pen Inject 12 Units into the skin daily before breakfast. 06/26/17 07/26/17  Jeffrey Resides, MD  Insulin Glulisine (APIDRA SOLOSTAR) 100 UNIT/ML Solostar Pen Take 2 units two times a day with your Glucerna as long as your blood sugar is higher than 200. Take 5 units with your evening meal as long as your blood sugar is higher than 100. 06/26/17   Hensel, Jamal Collin, MD  lactulose (CHRONULAC) 10 GM/15ML solution Take 15 mLs (10 g total) by mouth daily. 07/03/17   Carlyle Dolly, MD  metoprolol (LOPRESSOR) 100 MG tablet TAKE 1 Tablet  BY MOUTH TWICE DAILY Patient taking differently: TAKE 100 mg Tablet  BY MOUTH TWICE DAILY 12/11/16   Ronnie Doss M, DO  Multiple Vitamin (MULTIVITAMIN WITH MINERALS) TABS tablet Take 1 tablet by mouth at bedtime.     [provider]  naproxen (NAPROSYN) 500 MG tablet Take 1 tablet (500 mg total) by mouth 2 (two) times daily with a meal. Patient taking differently: Take 500 mg by mouth daily.  05/05/17   Thurman Coyer, DO  olmesartan (BENICAR) 40 MG tablet Take 1 tablet (40 mg total) by  mouth daily. Patient taking differently: Take 40 mg by mouth at bedtime.  11/05/16   [name], DO  omeprazole (PRILOSEC) 20 MG capsule Take 1 capsule (20 mg total) by mouth daily. Patient taking differently: Take 40 mg by mouth daily.  03/26/17   Rumley, Burna Cash, DO  polyvinyl alcohol (LIQUIFILM TEARS) 1.4 % ophthalmic solution Place 1 drop into both eyes every 4 (four) hours as needed for dry eyes. 06/19/16   [name], DO  tadalafil (CIALIS) 10 MG tablet Take 1 tablet (10 mg total) by mouth daily as needed for erectile dysfunction. 05/02/17   Sela Hilding, MD  thiamine (VITAMIN B-1) 100 MG tablet Take 1 tablet (100 mg total) by mouth daily. 05/27/16   [name], DO    Family History Family History  Problem Relation Age of Onset  . Other Mother  Unsure of medical history.  . Diabetes Mellitus II Father     Social History Social History  Substance Use Topics  . Smoking status: Never Smoker  . Smokeless tobacco: Current User    Types: Snuff  . Alcohol use 3.6 oz/week    6 Cans of beer per week     Allergies   Lisinopril   Review of Systems Review of Systems  Unable to perform ROS: Mental status change  Neurological: Negative for headaches.     Physical Exam Updated Vital Signs BP 136/79   Pulse 76   Temp 98.6 F (37 C)   Resp 15   SpO2 100%   Physical Exam CONSTITUTIONAL: chronically ill appearing, no distress, smells of ketones HEAD: Normocephalic/atraumatic, no visible trauma EYES: EOMI ENMT: Mucous membranes dry, snuff tobacco in mouth NECK: supple no meningeal signs SPINE/BACK:entire spine nontender, No bruising/crepitance/stepoffs noted to spine CV: S1/S2 noted, no murmurs/rubs/gallops noted LUNGS: Lungs are clear to auscultation bilaterally, no apparent distress Chest - no tenderness ABDOMEN: soft, nontender NEURO: Pt is awake/alert.  He is slow to respond.  He appears confused.  ?Mild expressive aphasia  noted No arm/leg drift, no facial droop noted   EXTREMITIES: pulses normal/equal, full ROM, no deformities or tenderness noted SKIN: warm, color normal PSYCH: unable to assess  ED Treatments / Results  Labs (all labs ordered are listed, but only abnormal results are displayed) Labs Reviewed  COMPREHENSIVE METABOLIC PANEL - Abnormal; Notable for the following:       Result Value   Sodium 134 (*)    Potassium 5.5 (*)    Chloride 96 (*)    Glucose, Bld 782 (*)    Creatinine, Ser 1.47 (*)    Calcium 8.8 (*)    Albumin 3.2 (*)    Alkaline Phosphatase 206 (*)    GFR calc non Af Amer 53 (*)    All other components within normal limits  CBC WITH DIFFERENTIAL/PLATELET - Abnormal; Notable for the following:    RBC 3.84 (*)    Hemoglobin 11.8 (*)    HCT 35.9 (*)    All other components within normal limits  VALPROIC ACID LEVEL - Abnormal; Notable for the following:    Valproic Acid Lvl <10 (*)    All other components within normal limits  AMMONIA - Abnormal; Notable for the following:    Ammonia 39 (*)    All other components within normal limits  ETHANOL - Abnormal; Notable for the following:    Alcohol, Ethyl (B) 260 (*)    All other components within normal limits  CBG MONITORING, ED - Abnormal; Notable for the following:    Glucose-Capillary >600 (*)    All other components within normal limits  CK  RAPID URINE DRUG SCREEN, HOSP PERFORMED    EKG  EKG Interpretation  Date/Time:  Thursday July 10 2017 03:28:12 EDT Ventricular Rate:  76 PR Interval:    QRS Duration: 100 QT Interval:  424 QTC Calculation: 477 R Axis:   103 Text Interpretation:  Sinus rhythm Consider left atrial enlargement Right axis deviation Borderline prolonged QT interval Baseline wander in lead(s) V2 No significant change since last tracing Confirmed by Ripley Fraise [phone]) on 07/10/2017 3:33:51 AM       Radiology Ct Head Wo Contrast  Result Date: 07/10/2017 CLINICAL DATA:  Found down,  altered mental status. History of anoxic encephalopathy, alcohol abuse, cardiac arrest. EXAM: CT HEAD WITHOUT CONTRAST TECHNIQUE: Contiguous axial images were obtained from the base of  the skull through the vertex without intravenous contrast. COMPARISON:  MRI of the head August 27, 2016 FINDINGS: BRAIN: No intraparenchymal hemorrhage, mass effect nor midline shift. Mild to moderate parenchymal brain volume loss, no hydrocephalus. Advanced vermian atrophy. No acute large vascular territory infarcts. No abnormal extra-axial fluid collections. Basal cisterns are patent. VASCULAR: Trace calcific atherosclerosis of the carotid siphons. SKULL: No skull fracture. Small LEFT parietal scalp hematoma without subcutaneous gas or radiopaque foreign bodies. SINUSES/ORBITS: The mastoid air-cells and included paranasal sinuses are well-aerated.The included ocular globes and orbital contents are non-suspicious. LEFT ocular lens implant. OTHER: None. IMPRESSION: 1. Small LEFT posterior scalp hematoma. No skull fracture. No acute intracranial process. 2. Stable mild-to-moderate parenchymal brain volume loss with disproportionate vermian atrophy. Electronically Signed   By: Elon Alas M.D.   On: 07/10/2017 05:05    Procedures Procedures  CRITICAL CARE Performed by: Sharyon Cable Total critical care time: 35 minutes Critical care time was exclusive of separately billable procedures and treating other patients. Critical care was necessary to treat or prevent imminent or life-threatening deterioration. Critical care was time spent personally by me on the following activities: development of treatment plan with patient and/or surrogate as well as nursing, discussions with consultants, evaluation of patient's response to treatment, examination of patient, obtaining history from patient or surrogate, ordering and performing treatments and interventions, ordering and review of laboratory studies, ordering and review  of radiographic studies, pulse oximetry and re-evaluation of patient's condition. PATIENT WITH GLUCOSE >600 REQUIRING IV FLUIDS AND IV INSULIN AND ADMISSION   Medications Ordered in ED Medications  insulin regular (NOVOLIN R,HUMULIN R) 100 Units in sodium chloride 0.9 % 100 mL (1 Units/mL) infusion (not administered)  sodium chloride 0.9 % bolus 1,000 mL (0 mLs Intravenous Stopped 07/10/17 0442)  sodium chloride 0.9 % bolus 1,000 mL (0 mLs Intravenous Stopped 07/10/17 0546)     Initial Impression / Assessment and Plan / ED Course  I have reviewed the triage vital signs and the nursing notes.  Pertinent labs & imaging results that were available during my care of the patient were reviewed by me and considered in my medical decision making (see chart for details).     3:58 AM Pt from home He is confused, hypeglycemic, concern for DKA He reports recent slurred speech and this has been documented by PCP.   Will obtain STAT CT head Will follow closely tPA in stroke considered but not given due to: Onset over 3-4.5hours 5:52 AM Pt with severe hyperglycemia but NOT in DKA He is intoxicated CT head negative Will admit to family medicine Pt resting comfortably at this time Insulin drip ordered D/w family medicine for admission and correction of glucose  Final Clinical Impressions(s) / ED Diagnoses   Final diagnoses:  Hyperglycemia  Dehydration  Hyperkalemia  Alcohol abuse    New Prescriptions New Prescriptions   No medications on file     Ripley Fraise, MD 07/10/17 [phone]

## 2017-07-10 NOTE — ED Notes (Signed)
MD repaged for Insulin dose as per order

## 2017-07-10 NOTE — ED Notes (Signed)
Unable to give urine specimen at this time , urinal at bedside/instructions given , IV NS bolus infusing.

## 2017-07-11 DIAGNOSIS — R4182 Altered mental status, unspecified: Secondary | ICD-10-CM

## 2017-07-11 LAB — GLUCOSE, CAPILLARY
GLUCOSE-CAPILLARY: 108 mg/dL — AB (ref 65–99)
GLUCOSE-CAPILLARY: 143 mg/dL — AB (ref 65–99)
GLUCOSE-CAPILLARY: 192 mg/dL — AB (ref 65–99)
Glucose-Capillary: 61 mg/dL — ABNORMAL LOW (ref 65–99)
Glucose-Capillary: 48 mg/dL — ABNORMAL LOW (ref 65–99)

## 2017-07-11 LAB — BASIC METABOLIC PANEL
Anion gap: 11 (ref 5–15)
BUN: 7 mg/dL (ref 6–20)
CALCIUM: 8 mg/dL — AB (ref 8.9–10.3)
CO2: 22 mmol/L (ref 22–32)
CREATININE: 1.05 mg/dL (ref 0.61–1.24)
Chloride: 106 mmol/L (ref 101–111)
GFR calc non Af Amer: >60 mL/min (ref >60)
Glucose, Bld: 115 mg/dL — ABNORMAL HIGH (ref 65–99)
Potassium: 3.5 mmol/L (ref 3.5–5.1)
Sodium: 139 mmol/L (ref 135–145)

## 2017-07-11 LAB — MRSA PCR SCREENING: MRSA BY PCR: NEGATIVE

## 2017-07-11 NOTE — Plan of Care (Signed)
Problem: Food- and Nutrition-Related Knowledge Deficit (NB-1.1) Goal: Nutrition education Formal process to instruct or train a patient/client in a skill or to impart knowledge to help patients/clients voluntarily manage or modify food choices and eating behavior to maintain or improve health. Outcome: Adequate for Discharge  RD provided for nutrition education regarding diabetes.   Lab Results  Component Value Date   HGBA1C 12.6 (H) 06/10/2017   Pt expresses frustration over meal planning and blood sugar control. He reports he tries to read labels, but struggles over carbohydrate counting and meal planning. He has attended DSME courses in the past, but has not found them helpful. RD spent 30-45 minutes educating pt on hypoglycemia management, importance of self-management behaviors, and simplifying DM diet to improve compliance.   RD provided "Carbohydrate Counting for People with Diabetes" handout from the Academy of [employer] Dietetics. Discussed different food groups and their effects on blood sugar, emphasizing carbohydrate-containing foods. Provided list of carbohydrates and recommended serving sizes of common foods.  Discussed importance of controlled and consistent carbohydrate intake throughout the day. Provided examples of ways to balance meals/snacks and encouraged intake of high-fiber, whole grain complex carbohydrates. Teach back method used.  Expect fair compliance.  Body mass index is 33.98 kg/m. Pt meets criteria for obesity, class I based on current BMI.  Current diet order is carb modified, patient is consuming approximately 100% of meals at this time. Labs and medications reviewed. No further nutrition interventions warranted at this time. RD contact information provided. If additional nutrition issues arise, please re-consult RD.  Zaina Jenkin A. Jimmye Norman, RD, LDN, CDE Pager: [phone] After hours Pager: [phone]

## 2017-07-11 NOTE — Evaluation (Signed)
Physical Therapy Evaluation Patient Details Name: Jeffrey [name] MRN: [id number] DOB: 1966/07/09 Today's Date: 07/11/2017   History of Present Illness  Pt is a 51 y/o male admitted secondary to AMS and sustaining a fall at home. +ETOH. PMH including but not limited to alcohol abuse, seizures, hepatic cirrhosis, DM, HTN, MI in 2014 and anoxic BI in 2014.  Clinical Impression  Pt presented supine in bed with HOB elevated, awake and willing to participate in therapy session. Prior to admission, pt reported that he ambulates with use of SPC at all times and occasionally uses a RW. He stated that he is independent with ADLs but is unable to drive secondary to seizures. Pt lives with his wife and adult children, which he reports have "mental disabilities". Pt ambulated in hallway with supervision while pushing the IV pole. Pt denied pain and dizziness with ambulation. PT will continue to follow acutely for mobility progression and to ensure a safe d/c home.    Follow Up Recommendations No PT follow up    Equipment Recommendations  None recommended by PT    Recommendations for Other Services       Precautions / Restrictions Precautions Precautions: Fall Restrictions Weight Bearing Restrictions: No      Mobility  Bed Mobility Overal bed mobility: Modified Independent                Transfers Overall transfer level: Needs assistance Equipment used: None Transfers: Sit to/from Stand Sit to Stand: Supervision         General transfer comment: supervision for safety  Ambulation/Gait Ambulation/Gait assistance: Supervision Ambulation Distance (Feet): 200 Feet Assistive device:  (pushed IV pole) Gait Pattern/deviations: Step-through pattern;Decreased stride length Gait velocity: decreased Gait velocity interpretation: Below normal speed for age/gender General Gait Details: mild instability but no overt LOB or need for physical assistance, supervision for safety   Stairs            Wheelchair Mobility    Modified Rankin (Stroke Patients Only)       Balance Overall balance assessment: Needs assistance Sitting-balance support: Feet supported Sitting balance-Leahy Scale: Good     Standing balance support: During functional activity;No upper extremity supported Standing balance-Leahy Scale: Good                               Pertinent Vitals/Pain Pain Assessment: No/denies pain    Home Living Family/patient expects to be discharged to:: Private residence Living Arrangements: Spouse/significant other;Children Available Help at Discharge: Family Type of Home: House Home Access: Stairs to enter Entrance Stairs-Rails: None Entrance Stairs-Number of Steps: 3 Home Layout: One level Home Equipment: Environmental consultant - 2 wheels;Cane - single point      Prior Function Level of Independence: Independent with assistive device(s)         Comments: uses SPC at all times, occasionally uses RW. wife drives as pt cannot due to seizures     Hand Dominance        Extremity/Trunk Assessment   Upper Extremity Assessment Upper Extremity Assessment: Defer to OT evaluation    Lower Extremity Assessment Lower Extremity Assessment: Overall WFL for tasks assessed    Cervical / Trunk Assessment Cervical / Trunk Assessment: Normal  Communication   Communication: No difficulties  Cognition Arousal/Alertness: Awake/alert Behavior During Therapy: WFL for tasks assessed/performed Overall Cognitive Status: Within Functional Limits for tasks assessed  General Comments: cognition not formally assessed but [facility] for general conversation; pt was alert, oriented and following commands appropriately      General Comments      Exercises     Assessment/Plan    PT Assessment Patient needs continued PT services  PT Problem List Decreased balance;Decreased mobility;Decreased coordination;Decreased  knowledge of use of DME;Decreased safety awareness;Decreased knowledge of precautions       PT Treatment Interventions DME instruction;Gait training;Stair training;Functional mobility training;Therapeutic activities;Therapeutic exercise;Balance training;Neuromuscular re-education;Patient/family education    PT Goals (Current goals can be found in the Care Plan section)  Acute Rehab PT Goals Patient Stated Goal: return home PT Goal Formulation: With patient Time For Goal Achievement: 07/25/17 Potential to Achieve Goals: Good    Frequency Min 3X/week   Barriers to discharge        Co-evaluation               AM-PAC PT "6 Clicks" Daily Activity  Outcome Measure Difficulty turning over in bed (including adjusting bedclothes, sheets and blankets)?: None Difficulty moving from lying on back to sitting on the side of the bed? : None Difficulty sitting down on and standing up from a chair with arms (e.g., wheelchair, bedside commode, etc,.)?: A Little Help needed moving to and from a bed to chair (including a wheelchair)?: A Little Help needed walking in hospital room?: A Little Help needed climbing 3-5 steps with a railing? : A Little 6 Click Score: 20    End of Session Equipment Utilized During Treatment: Gait belt Activity Tolerance: Patient tolerated treatment well Patient left: in chair;with call bell/phone within reach;with chair alarm set Nurse Communication: Mobility status PT Visit Diagnosis: Other abnormalities of gait and mobility (R26.89);Unsteadiness on feet (R26.81)    Time: 4098-1191 PT Time Calculation (min) (ACUTE ONLY): 23 min   Charges:   PT Evaluation $PT Eval Moderate Complexity: 1 Mod PT Treatments $Gait Training: 8-22 mins   PT G Codes:   PT G-Codes **NOT FOR INPATIENT CLASS** Functional Assessment Tool Used: AM-PAC 6 Clicks Basic Mobility;Clinical judgement Functional Limitation: Mobility: Walking and moving around Mobility: Walking and Moving  Around Current Status ([id number]): At least 20 percent but less than 40 percent impaired, limited or restricted Mobility: Walking and Moving Around Goal Status [phone]): 0 percent impaired, limited or restricted    [facility], PT, DPT [place] 07/11/2017, 9:20 AM

## 2017-07-11 NOTE — Discharge Summary (Signed)
[facility] Discharge Summary  Patient name: Jeffrey [name] Medical record number: [id number] Date of birth: July 23, 1966 Age: 51 y.o. Gender: male Date of Admission: 07/10/2017 Date of Discharge:07/11/2017 Admitting Physician: Kinnie Feil, MD  Primary Care Provider: Sela Hilding, MD Consultants: None  Indication for Hospitalization: AMS, hyperglycemia, slurred speech  Discharge Diagnoses/Problem List:  AMS Hyperglycemia with history of poorly controlled T2DM Slurred speech Hyperlipidemia HTN Alcohol abuse Diabetic neuropathy Hepatic cirrhosis AKI Multiple psych disorders Seizure history   Disposition: discharge to home   Discharge Condition: stable, improved  Discharge Exam:  General: awake and alert, laying in bed watching television  Cardiovascular: RRR, no MRG Respiratory: CTAB, no wheezes, rales or rhonchi  Abdomen: soft, non tender, non distended, bowel sounds x 4 quadrants  Extremities: 5/5 muscle strength bilaterally in all 4 extremities Neuro: AAOx3, CN2-12 intact, 5/5 muscle strength bilaterally, normal grip strength, equal sensation bilaterally, minimal slurring of speech   Brief Hospital Course:  Jeffrey [name] is a 51 y.o. male presenting with AMS, hyperglycemia, and slurred speech. Patient states he was found down by his wife but is unable to recall events leading to fall. Patient endorses increased alcohol intake in the afternoon and remembers falling asleep, but is unable to remember anything after going to sleep. On admission patient was AAOx3 but had some slurred speech. ETOH was elevated to 260 on admission. Ammonia on admission slightly elevated to 39, and patient states poor compliance with lactulose. Patient was also hyperglycemic on admission with initial glucose of 782, improving to 597 following fluid hydration. Glucose continued to improve after moderate sliding scale insulin and fluid hydration. Per chart review  patient has poor glycemic control. Patient also reported 2 week history of slurred speech, of which he was evaluated by PCP office on 9/27. CT head showed small left posterior scalp hematoma but no acute bleeds. Patient continued to improve with continued fluid hydration and medication management. Likely patient developed symptoms of AMS and hyperglycemic due to increased alcohol intake with decreased PO intake and lack of medication compliance. Patient on discharge was AAOx3 with normal neurological exam and mentation.   Issues for Follow Up:  1. Compliance with diabetic medication regimen. Unclear whether patient is taking medications as reported and what patient's diet habits have been recently  2. Compliance with lactulose  3. Monitor Valproic acid levels and compliance   Significant Procedures: none   Significant Labs and Imaging:   Recent Labs Lab 07/10/17 0413  WBC 7.0  HGB 11.8*  HCT 35.9*  PLT 286    Recent Labs Lab 07/10/17 0413 07/10/17 1046 07/11/17 0528  NA 134* 141 139  K 5.5* 3.8 3.5  CL 96* 107 106  CO2 23 22 22   GLUCOSE 782* 186* 115*  BUN 10 8 7   CREATININE 1.47* 1.13 1.05  CALCIUM 8.8* 8.4* 8.0*  ALKPHOS 206*  --   --   AST 40  --   --   ALT 22  --   --   ALBUMIN 3.2*  --   --    Ct Head Wo Contrast  Result Date: 07/10/2017 CLINICAL DATA:  Found down, altered mental status. History of anoxic encephalopathy, alcohol abuse, cardiac arrest. EXAM: CT HEAD WITHOUT CONTRAST TECHNIQUE: Contiguous axial images were obtained from the base of the skull through the vertex without intravenous contrast. COMPARISON:  MRI of the head August 27, 2016 FINDINGS: BRAIN: No intraparenchymal hemorrhage, mass effect nor midline shift. Mild to moderate parenchymal brain  volume loss, no hydrocephalus. Advanced vermian atrophy. No acute large vascular territory infarcts. No abnormal extra-axial fluid collections. Basal cisterns are patent. VASCULAR: Trace calcific atherosclerosis  of the carotid siphons. SKULL: No skull fracture. Small LEFT parietal scalp hematoma without subcutaneous gas or radiopaque foreign bodies. SINUSES/ORBITS: The mastoid air-cells and included paranasal sinuses are well-aerated.The included ocular globes and orbital contents are non-suspicious. LEFT ocular lens implant. OTHER: None. IMPRESSION: 1. Small LEFT posterior scalp hematoma. No skull fracture. No acute intracranial process. 2. Stable mild-to-moderate parenchymal brain volume loss with disproportionate vermian atrophy. Electronically Signed   By: Elon Alas M.D.   On: 07/10/2017 05:05    Results/Tests Pending at Time of Discharge:  [facility]     Ordered   07/11/17 1121  MRSA PCR Screening  Once,   R     07/11/17 1120       Discharge Medications:  Allergies as of 07/11/2017      Reactions   Lisinopril Other (See Comments)   Pt reports nose bleed.      Medication List    TAKE these medications   aspirin EC 81 MG tablet Take 81 mg by mouth at bedtime.   atorvastatin 80 MG tablet Commonly known as:  LIPITOR Take 1 tablet (80 mg total) by mouth daily. What changed:  when to take this   cyclobenzaprine 10 MG tablet Commonly known as:  FLEXERIL Take 1 tablet (10 mg total) by mouth 3 (three) times daily as needed for muscle spasms.   divalproex 500 MG 24 hr tablet Commonly known as:  DEPAKOTE ER Take 1 tablet (500 mg total) by mouth 2 (two) times daily.   feeding supplement (GLUCERNA SHAKE) Liqd Take 237 mLs by mouth 2 (two) times daily between meals.   folic acid 1 MG tablet Commonly known as:  FOLVITE Take 1 tablet (1 mg total) by mouth daily.   gabapentin 300 MG capsule Commonly known as:  NEURONTIN Take 1 capsule (300 mg total) by mouth 4 (four) times daily.   hydrochlorothiazide 25 MG tablet Commonly known as:  HYDRODIURIL Take 1 tablet (25 mg total) by mouth daily. What changed:  when to take this   Insulin Glargine 100 UNIT/ML Solostar  Pen Commonly known as:  LANTUS Inject 12 Units into the skin daily before breakfast.   Insulin Glulisine 100 UNIT/ML Solostar Pen Commonly known as:  APIDRA SOLOSTAR Take 2 units two times a day with your Glucerna as long as your blood sugar is higher than 200. Take 5 units with your evening meal as long as your blood sugar is higher than 100.   lactulose 10 GM/15ML solution Commonly known as:  CHRONULAC Take 15 mLs (10 g total) by mouth daily.   metoprolol tartrate 100 MG tablet Commonly known as:  LOPRESSOR TAKE 1 Tablet  BY MOUTH TWICE DAILY What changed:  See the new instructions.   multivitamin with minerals Tabs tablet Take 1 tablet by mouth at bedtime.   naproxen 500 MG tablet Commonly known as:  NAPROSYN Take 1 tablet (500 mg total) by mouth 2 (two) times daily with a meal. What changed:  when to take this   olmesartan 40 MG tablet Commonly known as:  BENICAR Take 1 tablet (40 mg total) by mouth daily. What changed:  when to take this   omeprazole 20 MG capsule Commonly known as:  PRILOSEC Take 1 capsule (20 mg total) by mouth daily. What changed:  how much to take  polyvinyl alcohol 1.4 % ophthalmic solution Commonly known as:  LIQUIFILM TEARS Place 1 drop into both eyes every 4 (four) hours as needed for dry eyes.   tadalafil 10 MG tablet Commonly known as:  CIALIS Take 1 tablet (10 mg total) by mouth daily as needed for erectile dysfunction.   thiamine 100 MG tablet Commonly known as:  VITAMIN B-1 Take 1 tablet (100 mg total) by mouth daily.       Discharge Instructions: Please refer to Patient Instructions section of EMR for full details.  Patient was counseled important signs and symptoms that should prompt return to medical care, changes in medications, dietary instructions, activity restrictions, and follow up appointments.   Follow-Up Appointments: Follow-up Information    [name], DO Follow up on 07/15/2017.   Specialty:  Family  Medicine Why:  Your appointment is at 2:30 pm. Please make sure your arrive early. Contact information: [address] [address] [phone]           Marjie Skiff, MD 07/11/2017, 12:38 PM PGY-1, [place]

## 2017-07-11 NOTE — Progress Notes (Signed)
Jeffrey [name], Jeffrey [name] 51 yrs old male patient admitted from ED.patient is alert oriented. Vital signs are stable. Skin is intact. Patient given instruction about call light and phone. Bed in low position and side rail up x2.

## 2017-07-11 NOTE — Discharge Summary (Signed)
Pt discharged home with family. Pt alert and oriented x 4. AVS discussed with patient. Pt home in private vehicle

## 2017-07-11 NOTE — Discharge Instructions (Signed)
You were admitted for altered mental status, hyperglycemia, and slurred speech. Your mental status improved and you were no longer altered on discharge. Your sugars also improved after fluid hydration and insulin management. Your speech improved with medication management and hydration.   Please follow up with your PCP on **.   If you develop symptoms of vision changes, altered mental status, declining speech, facial droop, dizziness, lightheadedness, or fainting please call your PCP or come to the emergency room.

## 2017-07-11 NOTE — Progress Notes (Signed)
Nutrition Brief Note  Patient identified on the Malnutrition Screening Tool (MST) Report  Wt Readings from Last 15 Encounters:  07/11/17 236 lb 12.8 oz (107.4 kg)  07/03/17 235 lb 3.2 oz (106.7 kg)  06/26/17 235 lb 12.8 oz (107 kg)  06/16/17 244 lb 12.8 oz (111 kg)  06/10/17 258 lb 2.5 oz (117.1 kg)  05/08/17 258 lb 9.6 oz (117.3 kg)  05/02/17 241 lb 6.4 oz (109.5 kg)  04/08/17 226 lb (102.5 kg)  03/26/17 231 lb (104.8 kg)  12/03/16 239 lb 6.4 oz (108.6 kg)  10/10/16 242 lb 6.4 oz (110 kg)  09/25/16 234 lb 9.6 oz (106.4 kg)  09/09/16 235 lb (106.6 kg)  07/30/16 230 lb (104.3 kg)  06/19/16 228 lb (103.4 kg)   51 Y/O M with PMX of  DM2, HTN, HLD, ETOH abuse, presented to the hospital with hx of LOC and fell at home.  Spoke with pt, who reports intentional weight loss related to lifestyle changes. He has been trying to watch what he eats to control his blood sugar, but shares he struggles greatly with meal planning. He consumes 2 meals per day, which consist of sandwich and a meat, starch, and vegetable.   Last Hgb A1c: 12.6 (06/10/17). Pt reports having DM for an extended period of time, but unsure when he was first diagnosed. He has recently been experiencing multiple hypoglycemic episodes at home. He also admits that skipping medications at home, due to fear of recurrent hypoglycemia. He has attended DSME classes in the past, but did not find these helpful. This RD asked pt if he feels like a return visit for refresher would be helpful, but reports "I need to think about it".  Home DM medications include 12 units insulin glargine daily and 2 units insulin gluisine 2 units BID, 5 units q HS.   Nutrition-Focused physical exam completed. Findings are no fat depletion, no muscle depletion, and no edema.   RD spent the majority of visit discussing meal planning and self-management with pt. See education note for further details. Will also refer to outpatient DSME for additional reinforcement.    Body mass index is 33.98 kg/m. Patient meets criteria for obesity, class I based on current BMI.   Current diet order is Carb Modified, patient is consuming approximately 100% of meals at this time. Labs and medications reviewed.   No nutrition interventions warranted at this time. If nutrition issues arise, please consult RD.   Malynn Lucy A. Jimmye Norman, RD, LDN, CDE Pager: [phone] After hours Pager: [phone]

## 2017-07-15 ENCOUNTER — Inpatient Hospital Stay: Payer: Self-pay | Admitting: Family Medicine

## 2017-07-16 ENCOUNTER — Inpatient Hospital Stay: Payer: Medicaid Other | Admitting: Internal Medicine

## 2017-07-24 ENCOUNTER — Telehealth (INDEPENDENT_AMBULATORY_CARE_PROVIDER_SITE_OTHER): Payer: Self-pay | Admitting: Orthopaedic Surgery

## 2017-07-24 NOTE — Telephone Encounter (Signed)
Received VM from [place] @ [facility] checking status of request. I called her back [phone] and lmvm. Stated that we don't have request, asked she refax to [name]

## 2017-07-27 ENCOUNTER — Inpatient Hospital Stay (HOSPITAL_COMMUNITY)
Admission: EM | Admit: 2017-07-27 | Discharge: 2017-07-30 | DRG: 312 | Disposition: A | Discharge status: Home / Self Care | Payer: Self-pay | Attending: Family Medicine | Admitting: Family Medicine

## 2017-07-27 ENCOUNTER — Encounter (HOSPITAL_COMMUNITY): Payer: Self-pay | Admitting: Pharmacy Technician

## 2017-07-27 ENCOUNTER — Inpatient Hospital Stay (HOSPITAL_COMMUNITY): Payer: Self-pay

## 2017-07-27 ENCOUNTER — Emergency Department (HOSPITAL_COMMUNITY): Payer: Self-pay

## 2017-07-27 DIAGNOSIS — N179 Acute kidney failure, unspecified: Secondary | ICD-10-CM | POA: Diagnosis present

## 2017-07-27 DIAGNOSIS — I959 Hypotension, unspecified: Secondary | ICD-10-CM | POA: Diagnosis present

## 2017-07-27 DIAGNOSIS — A419 Sepsis, unspecified organism: Secondary | ICD-10-CM

## 2017-07-27 DIAGNOSIS — R569 Unspecified convulsions: Secondary | ICD-10-CM

## 2017-07-27 LAB — CBC WITH DIFFERENTIAL/PLATELET
BASOS ABS: 0 10*3/uL (ref 0.0–0.1)
BASOS PCT: 0 %
Eosinophils Absolute: 0.2 10*3/uL (ref 0.0–0.7)
Eosinophils Relative: 2 %
HEMATOCRIT: 35.9 % — AB (ref 39.0–52.0)
HEMOGLOBIN: 12.4 g/dL — AB (ref 13.0–17.0)
Lymphocytes Relative: 14 %
Lymphs Abs: 1.5 10*3/uL (ref 0.7–4.0)
MCH: 30.7 pg (ref 26.0–34.0)
MCHC: 34.5 g/dL (ref 30.0–36.0)
MCV: 88.9 fL (ref 78.0–100.0)
Monocytes Absolute: 1.4 10*3/uL — ABNORMAL HIGH (ref 0.1–1.0)
Monocytes Relative: 12 %
NEUTROS ABS: 7.8 10*3/uL — AB (ref 1.7–7.7)
NEUTROS PCT: 72 %
Platelets: 185 10*3/uL (ref 150–400)
RBC: 4.04 MIL/uL — ABNORMAL LOW (ref 4.22–5.81)
RDW: 13.9 % (ref 11.5–15.5)
WBC: 10.9 10*3/uL — AB (ref 4.0–10.5)

## 2017-07-27 LAB — BASIC METABOLIC PANEL
ANION GAP: 14 (ref 5–15)
BUN: 32 mg/dL — ABNORMAL HIGH (ref 6–20)
CALCIUM: 8.9 mg/dL (ref 8.9–10.3)
CHLORIDE: 90 mmol/L — AB (ref 101–111)
CO2: 24 mmol/L (ref 22–32)
Creatinine, Ser: 2.86 mg/dL — ABNORMAL HIGH (ref 0.61–1.24)
GFR calc non Af Amer: 24 mL/min — ABNORMAL LOW (ref >60)
GFR, EST AFRICAN AMERICAN: 27 mL/min — AB (ref >60)
Glucose, Bld: 273 mg/dL — ABNORMAL HIGH (ref 65–99)
Potassium: 4.2 mmol/L (ref 3.5–5.1)
Sodium: 128 mmol/L — ABNORMAL LOW (ref 135–145)

## 2017-07-27 LAB — HEPATIC FUNCTION PANEL
ALK PHOS: 152 U/L — AB (ref 38–126)
ALT: 32 U/L (ref 17–63)
AST: 60 U/L — ABNORMAL HIGH (ref 15–41)
Albumin: 2.5 g/dL — ABNORMAL LOW (ref 3.5–5.0)
BILIRUBIN DIRECT: 0.2 mg/dL (ref 0.1–0.5)
BILIRUBIN INDIRECT: 0.6 mg/dL (ref 0.3–0.9)
TOTAL PROTEIN: 6.3 g/dL — AB (ref 6.5–8.1)
Total Bilirubin: 0.8 mg/dL (ref 0.3–1.2)

## 2017-07-27 LAB — ETHANOL

## 2017-07-27 LAB — VALPROIC ACID LEVEL: Valproic Acid Lvl: 13 ug/mL — ABNORMAL LOW (ref 50.0–100.0)

## 2017-07-27 LAB — I-STAT CG4 LACTIC ACID, ED: LACTIC ACID, VENOUS: 2.18 mmol/L — AB (ref 0.5–1.9)

## 2017-07-27 LAB — PROTIME-INR
INR: 0.99
PROTHROMBIN TIME: 13 s (ref 11.4–15.2)

## 2017-07-27 LAB — CBG MONITORING, ED: GLUCOSE-CAPILLARY: 268 mg/dL — AB (ref 65–99)

## 2017-07-27 LAB — AMMONIA: AMMONIA: 34 umol/L (ref 9–35)

## 2017-07-27 MED ORDER — SODIUM CHLORIDE 0.9 % IV BOLUS (SEPSIS)
1000.0000 mL | Freq: Once | INTRAVENOUS | Status: AC
  Administered 2017-07-28: 1000 mL via INTRAVENOUS

## 2017-07-27 MED ORDER — SODIUM CHLORIDE 0.9 % IV BOLUS (SEPSIS)
1000.0000 mL | Freq: Once | INTRAVENOUS | Status: AC
  Administered 2017-07-27: 1000 mL via INTRAVENOUS

## 2017-07-27 MED ORDER — SODIUM CHLORIDE 0.9 % IV SOLN
INTRAVENOUS | Status: DC
  Administered 2017-07-28: 01:00:00 via INTRAVENOUS

## 2017-07-27 MED ORDER — SODIUM CHLORIDE 0.9 % IV SOLN
Freq: Once | INTRAVENOUS | Status: AC
  Administered 2017-07-27: 18:00:00 via INTRAVENOUS

## 2017-07-27 NOTE — ED Triage Notes (Signed)
Pt arrives to the ED via [facility] with reports of recent falls. Pt with hx of seizures. Per EMS pt family states after the falls pt is confused like when he has seizures. Pt reports compliancy with seizure medications. All falls were unwitnessed. Pt las fall was Friday. Pt complaining of neck pain and posterior head pain. PERRLA intact.

## 2017-07-27 NOTE — ED Notes (Signed)
Attempted IV x 1 without success.  

## 2017-07-27 NOTE — ED Notes (Signed)
Pt with 2.5L IV fluid intake with 0 output. Bladder scanner shows 181ml in bladder.

## 2017-07-27 NOTE — ED Provider Notes (Signed)
[facility] EMERGENCY DEPARTMENT Provider Note   CSN: [id number] Arrival date & time: 07/27/17  1545     History   Chief Complaint Chief Complaint  Patient presents with  . Fall    HPI Jeffrey [name] is a 51 y.o. male.  51 year old male history of alcohol abuse, seizures on Depakote, type 2 diabetes, HTN, HLD, cardiac arrest who presents with multiple reported seizure-like episodes and posterior neck pain.  Patient states last seizure-like activity 2 days ago while in bathroom.  He states orthostatic type symptoms when standing.  He notes onset of prodromal symptoms before his reported seizures.  He states compliance with Depakote.  Was recently admitted 3 weeks ago for hyperglycemia and dehydration.  He states he is compliant with his Lantus and Apidra.  Has had decreased p.o. intake.  No fevers, vomiting, or diarrhea.  The history is provided by the patient and medical records. No language interpreter was used.    Past Medical History:  Diagnosis Date  . Acute renal insufficiency 12/08/2012  . AKI (acute kidney injury) ([place])   . ALCOHOL ABUSE, HX OF 11/10/2007  . Anoxic encephalopathy ([place])   . Anxiety   . Bleeding ulcer 2014  . Blood transfusion 2014   "related to bleeding ulcer"  . Cardiac arrest ([place]) 12/07/2012   Anoxic encephalopathy  . GERD (gastroesophageal reflux disease)   . High cholesterol   . Hypertension   . OSA on CPAP 12/07/2012  . Seizure-like activity ([place])   . Type II diabetes mellitus ([place])   . Venous insufficiency 12/13/2011    Patient Active Problem List   Diagnosis Date Noted  . Hypotension 07/27/2017  . Speech disturbance 07/03/2017  . DKA (diabetic ketoacidoses) ([place]) 06/09/2017  . Hyponatremia 04/08/2017  . Seizure disorder ([place]) 04/08/2017  . Depression 12/17/2015  . Hip pain 12/17/2015  . Proteinuria 11/23/2015  . Loss of teeth 11/23/2015  . Essential hypertension   . Altered mental status 09/12/2015  . Diabetic  polyneuropathy associated with type 2 diabetes mellitus ([place]) 08/04/2015  . Hepatic cirrhosis ([place]) 03/22/2015  . Ataxia 03/20/2015  . Aphasia   . Elevated liver enzymes   . Dysarthria 03/09/2015  . AKI (acute kidney injury) ([place])   . Alcohol intoxication ([place])   . Alcohol abuse   . Erosive esophagitis 10/10/2014  . HLD (hyperlipidemia)   . Passed out [facility]) 10/08/2014  . Hyperglycemia   . Syncope and collapse   . Dehydration 05/18/2014  . OSA on CPAP 12/28/2012  . Pulmonary edema 12/08/2012  . Cardiac arrest ([place]) 12/07/2012  . Obesity 12/07/2012  . Anoxic brain injury ([place]) 12/07/2012  . GI bleed 11/26/2012  . Venous insufficiency 12/13/2011  . 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 ALCOHOL ABUSE, HX OF 11/10/2007  . ERECTILE DYSFUNCTION 04/14/2007  . Type 2 diabetes mellitus with hemoglobin A1c goal of less than 7.5% ([place]) 12/04/2006  . HYPERCHOLESTEROLEMIA 12/04/2006  . OBESITY, NOS 12/04/2006  . Anxiety state 12/04/2006  . HYPERTENSION, BENIGN SYSTEMIC 12/04/2006  . ARTHRITIS 12/04/2006    Past Surgical History:  Procedure Laterality Date  . CARDIOVASCULAR STRESS TEST  03/16/13   Very Poor Exercise Tolerance; NON DIAGNOSTIC TEST  . CATARACT EXTRACTION W/ INTRAOCULAR LENS IMPLANT Left 03/07/2014   [place] @ [facility]  . ESOPHAGOGASTRODUODENOSCOPY Left 11/26/2012   Procedure: ESOPHAGOGASTRODUODENOSCOPY (EGD);  Surgeon: Wonda Horner, MD;  Location: [facility] ENDOSCOPY;  Service: Endoscopy;  Laterality: Left;  . ESOPHAGOGASTRODUODENOSCOPY N/A 10/10/2014  Procedure: ESOPHAGOGASTRODUODENOSCOPY (EGD);  Surgeon: Ladene Artist, MD;  Location: [facility] ENDOSCOPY;  Service: Endoscopy;  Laterality: N/A;  . KNEE ARTHROSCOPY Left ~ 1999       Home Medications    Prior to Admission medications   Medication Sig Start Date End Date Taking? Authorizing Provider  acetaminophen (TYLENOL) 500 MG tablet Take 1,000 mg by mouth every 6 (six) hours as needed for  headache (pain).   Yes [provider]  aspirin EC 81 MG tablet Take 81 mg by mouth at bedtime.    Yes [provider]  atorvastatin (LIPITOR) 80 MG tablet Take 1 tablet (80 mg total) by mouth daily. Patient taking differently: Take 80 mg by mouth at bedtime.  10/10/16  Yes Hensel, Jamal Collin, MD  divalproex (DEPAKOTE ER) 500 MG 24 hr tablet Take 1 tablet (500 mg total) by mouth 2 (two) times daily. Patient taking differently: Take 500 mg by mouth at bedtime.  06/26/17  Yes Kinnie Feil, MD  feeding supplement, GLUCERNA SHAKE, (GLUCERNA SHAKE) LIQD Take 237 mLs by mouth 2 (two) times daily between meals. Patient taking differently: Take 237 mLs by mouth daily.  06/12/17  Yes Smiley Houseman, MD  folic acid (FOLVITE) 1 MG tablet Take 1 tablet (1 mg total) by mouth daily. Patient taking differently: Take 1 mg by mouth at bedtime.  05/27/16  Yes Gottschalk, Leatrice Jewels M, DO  gabapentin (NEURONTIN) 300 MG capsule Take 1 capsule (300 mg total) by mouth 4 (four) times daily. Patient taking differently: Take 300-600 mg by mouth See admin instructions. Take 1 capsule (300 mg) by mouth daily at noon and 2 capsules (600 mg) at bedtime 06/26/17  Yes Eniola, Tawanna Solo T, MD  hydrochlorothiazide (HYDRODIURIL) 25 MG tablet Take 1 tablet (25 mg total) by mouth daily. Patient taking differently: Take 25 mg by mouth at bedtime.  05/08/17  Yes Sela Hilding, MD  insulin glargine (LANTUS) 100 unit/mL SOPN Inject 15 Units into the skin daily before breakfast.   Yes [provider]  Insulin Glulisine (APIDRA SOLOSTAR) 100 UNIT/ML Solostar Pen Take 2 units two times a day with your Glucerna as long as your blood sugar is higher than 200. Take 5 units with your evening meal as long as your blood sugar is higher than 100. Patient taking differently: Inject 5 Units into the skin See admin instructions. Inject 5 units subcutaneously up to two times daily before large meals 06/26/17  Yes Hensel,  Jamal Collin, MD  lactulose (CHRONULAC) 10 GM/15ML solution Take 15 mLs (10 g total) by mouth daily. 07/03/17  Yes Carlyle Dolly, MD  metoprolol (LOPRESSOR) 100 MG tablet TAKE 1 Tablet  BY MOUTH TWICE DAILY Patient taking differently: TAKE 1 TABLET (100 MG) BY MOUTH TWICE DAILY - AFTERNOON AND BEDTIME 12/11/16  Yes Ronnie Doss M, DO  Multiple Vitamin (MULTIVITAMIN WITH MINERALS) TABS tablet Take 1 tablet by mouth at bedtime.    Yes [provider]  naproxen (NAPROSYN) 500 MG tablet Take 1 tablet (500 mg total) by mouth 2 (two) times daily with a meal. Patient taking differently: Take 500 mg by mouth 2 (two) times daily. Afternoon and bedtime 05/05/17  Yes Draper, Timothy R, DO  olmesartan (BENICAR) 40 MG tablet Take 1 tablet (40 mg total) by mouth daily. Patient taking differently: Take 40 mg by mouth at bedtime.  11/05/16  Yes Gottschalk, Leatrice Jewels M, DO  omeprazole (PRILOSEC) 20 MG capsule Take 1 capsule (20 mg total) by mouth daily. Patient taking  differently: Take 40 mg by mouth at bedtime.  03/26/17  Yes Rumley, [place] N, DO  tadalafil (CIALIS) 10 MG tablet Take 1 tablet (10 mg total) by mouth daily as needed for erectile dysfunction. 05/02/17  Yes Sela Hilding, MD  thiamine (VITAMIN B-1) 100 MG tablet Take 1 tablet (100 mg total) by mouth daily. Patient taking differently: Take 100 mg by mouth at bedtime.  05/27/16  Yes Gottschalk, Leatrice Jewels M, DO  cyclobenzaprine (FLEXERIL) 10 MG tablet Take 1 tablet (10 mg total) by mouth 3 (three) times daily as needed for muscle spasms. Patient not taking: Reported on 06/09/2017 03/26/17   Lorna Few, DO  polyvinyl alcohol (LIQUIFILM TEARS) 1.4 % ophthalmic solution Place 1 drop into both eyes every 4 (four) hours as needed for dry eyes. Patient not taking: Reported on 07/27/2017 06/19/16   [name], DO    Family History Family History  Problem Relation Age of Onset  . Other Mother        Unsure of medical history.  .  Diabetes Mellitus II Father     Social History Social History  Substance Use Topics  . Smoking status: Never Smoker  . Smokeless tobacco: Current User    Types: Snuff  . Alcohol use 3.6 oz/week    6 Cans of beer per week     Allergies   Lisinopril   Review of Systems Review of Systems  Constitutional: Negative for chills and fever.  HENT: Negative for ear pain and sore throat.   Eyes: Negative for pain and visual disturbance.  Respiratory: Negative for cough and shortness of breath.   Cardiovascular: Negative for chest pain and palpitations.  Gastrointestinal: Negative for abdominal pain and vomiting.  Genitourinary: Negative for dysuria and hematuria.  Musculoskeletal: Positive for neck pain. Negative for arthralgias and back pain.  Skin: Negative for color change and rash.  Neurological: Positive for seizures. Negative for syncope.  All other systems reviewed and are negative.    Physical Exam Updated Vital Signs BP (!) 80/66   Pulse 85   Temp (!) 97.4 F (36.3 C) (Oral)   Resp (!) 21   Ht 5\' 10"  (1.778 m)   Wt 107 kg (236 lb)   SpO2 99%   BMI 33.86 kg/m   Physical Exam  Constitutional: He appears well-developed.  HENT:  Head: Normocephalic and atraumatic.  Eyes: Conjunctivae are normal.  Neck: Neck supple.  Cardiovascular: Normal rate and regular rhythm.   No murmur heard. Pulmonary/Chest: Effort normal and breath sounds normal. No respiratory distress.  Abdominal: Soft. He exhibits no distension. There is no tenderness. There is no guarding.  Musculoskeletal: He exhibits no edema.  Neurological: He is alert. No cranial nerve deficit. Coordination normal.  No focal neuro deficits. 5/5 motor strength and intact sensation in all extremities.   Skin: Skin is warm and dry.  Nursing note and vitals reviewed.    ED Treatments / Results  Labs (all labs ordered are listed, but only abnormal results are displayed) Labs Reviewed  CBC WITH  DIFFERENTIAL/PLATELET - Abnormal; Notable for the following:       Result Value   WBC 10.9 (*)    RBC 4.04 (*)    Hemoglobin 12.4 (*)    HCT 35.9 (*)    Neutro Abs 7.8 (*)    Monocytes Absolute 1.4 (*)    All other components within normal limits  BASIC METABOLIC PANEL - Abnormal; Notable for the following:    Sodium 128 (*)  Chloride 90 (*)    Glucose, Bld 273 (*)    BUN 32 (*)    Creatinine, Ser 2.86 (*)    GFR calc non Af Amer 24 (*)    GFR calc Af Amer 27 (*)    All other components within normal limits  VALPROIC ACID LEVEL - Abnormal; Notable for the following:    Valproic Acid Lvl 13 (*)    All other components within normal limits  HEPATIC FUNCTION PANEL - Abnormal; Notable for the following:    Total Protein 6.3 (*)    Albumin 2.5 (*)    AST 60 (*)    Alkaline Phosphatase 152 (*)    All other components within normal limits  CBG MONITORING, ED - Abnormal; Notable for the following:    Glucose-Capillary 268 (*)    All other components within normal limits  I-STAT CG4 LACTIC ACID, ED - Abnormal; Notable for the following:    Lactic Acid, Venous 2.18 (*)    All other components within normal limits  CULTURE, BLOOD (ROUTINE X 2)  CULTURE, BLOOD (ROUTINE X 2)  ETHANOL  PROTIME-INR  AMMONIA  URINALYSIS, ROUTINE W REFLEX MICROSCOPIC    EKG  EKG Interpretation None       Radiology Ct Head Wo Contrast  Result Date: 07/27/2017 CLINICAL DATA:  Recent falls.  Neck pain EXAM: CT HEAD WITHOUT CONTRAST CT CERVICAL SPINE WITHOUT CONTRAST TECHNIQUE: Multidetector CT imaging of the head and cervical spine was performed following the standard protocol without intravenous contrast. Multiplanar CT image reconstructions of the cervical spine were also generated. COMPARISON:  CT head 07/10/2017 FINDINGS: CT HEAD FINDINGS Brain: Moderate atrophy. Negative for acute infarct. Negative for hemorrhage or mass. Vascular: Negative for hyperdense vessel Skull: Negative for skull  fracture Sinuses/Orbits: Negative Other: None CT CERVICAL SPINE FINDINGS Alignment: Normal alignment.  Cervical kyphosis Skull base and vertebrae: Negative for fracture Soft tissues and spinal canal: Negative for mass. Carotid artery calcification bilaterally. Disc levels: Disc degeneration and spondylosis at C4-5 causing mild spinal stenosis. Mild disc degeneration at C3-4 and C6-7. Upper chest: Negative Other: None IMPRESSION: Moderate atrophy.  No acute intracranial abnormality Cervical spine degenerative change.  Negative for fracture. Electronically Signed   By: Franchot Gallo M.D.   On: 07/27/2017 18:48   Ct Cervical Spine Wo Contrast  Result Date: 07/27/2017 CLINICAL DATA:  Recent falls.  Neck pain EXAM: CT HEAD WITHOUT CONTRAST CT CERVICAL SPINE WITHOUT CONTRAST TECHNIQUE: Multidetector CT imaging of the head and cervical spine was performed following the standard protocol without intravenous contrast. Multiplanar CT image reconstructions of the cervical spine were also generated. COMPARISON:  CT head 07/10/2017 FINDINGS: CT HEAD FINDINGS Brain: Moderate atrophy. Negative for acute infarct. Negative for hemorrhage or mass. Vascular: Negative for hyperdense vessel Skull: Negative for skull fracture Sinuses/Orbits: Negative Other: None CT CERVICAL SPINE FINDINGS Alignment: Normal alignment.  Cervical kyphosis Skull base and vertebrae: Negative for fracture Soft tissues and spinal canal: Negative for mass. Carotid artery calcification bilaterally. Disc levels: Disc degeneration and spondylosis at C4-5 causing mild spinal stenosis. Mild disc degeneration at C3-4 and C6-7. Upper chest: Negative Other: None IMPRESSION: Moderate atrophy.  No acute intracranial abnormality Cervical spine degenerative change.  Negative for fracture. Electronically Signed   By: Franchot Gallo M.D.   On: 07/27/2017 18:48    Procedures Procedures (including critical care time)  Medications Ordered in ED Medications  sodium  chloride 0.9 % bolus 1,000 mL (not administered)  0.9 %  sodium chloride  infusion (not administered)  sodium chloride 0.9 % bolus 1,000 mL (0 mLs Intravenous Stopped 07/27/17 1853)  0.9 %  sodium chloride infusion ( Intravenous New Bag/Given 07/27/17 1800)     Initial Impression / Assessment and Plan / ED Course  I have reviewed the triage vital signs and the nursing notes.  Pertinent labs & imaging results that were available during my care of the patient were reviewed by me and considered in my medical decision making (see chart for details).     44 yoM h/o lcohol abuse, seizures on Depakote, type 2 diabetes, HTN, HLD, cardiac arrest who p/w neck pain and seizure-like activity 2 days ago. Diffuse dry appearance. No focal neuro deficits.  CTH showing moderate atrophy. CT neck negative for fracture. Labs significant for dehydration including Na 128, Cl 90, Crt 2.86. Pt admitted to family medicine for further management and evaluation.  Pt became progressively more hypotensive while in ED. Additional IVF given by inpatient team. Escalated to SDU.  Pt care d/w Dr. Laverta Baltimore  Final Clinical Impressions(s) / ED Diagnoses   Final diagnoses:  AKI (acute kidney injury) ([place])  Seizure ([place])  Sepsis ([place])    New Prescriptions New Prescriptions   No medications on file     Payton Emerald, MD 07/28/17 0107    Margette Fast, MD 07/28/17 1104

## 2017-07-27 NOTE — ED Notes (Addendum)
Protime-INR added to blood in main lab.

## 2017-07-28 ENCOUNTER — Inpatient Hospital Stay (HOSPITAL_COMMUNITY): Payer: Self-pay

## 2017-07-28 DIAGNOSIS — A419 Sepsis, unspecified organism: Secondary | ICD-10-CM

## 2017-07-28 DIAGNOSIS — I9589 Other hypotension: Secondary | ICD-10-CM

## 2017-07-28 DIAGNOSIS — N390 Urinary tract infection, site not specified: Secondary | ICD-10-CM

## 2017-07-28 DIAGNOSIS — R0600 Dyspnea, unspecified: Secondary | ICD-10-CM

## 2017-07-28 DIAGNOSIS — N179 Acute kidney failure, unspecified: Secondary | ICD-10-CM

## 2017-07-28 LAB — COMPREHENSIVE METABOLIC PANEL
ALBUMIN: 2.7 g/dL — AB (ref 3.5–5.0)
ALT: 35 U/L (ref 17–63)
AST: 61 U/L — AB (ref 15–41)
Alkaline Phosphatase: 160 U/L — ABNORMAL HIGH (ref 38–126)
Anion gap: 8 (ref 5–15)
BUN: 29 mg/dL — AB (ref 6–20)
CHLORIDE: 104 mmol/L (ref 101–111)
CO2: 20 mmol/L — ABNORMAL LOW (ref 22–32)
Calcium: 8.1 mg/dL — ABNORMAL LOW (ref 8.9–10.3)
Creatinine, Ser: 1.94 mg/dL — ABNORMAL HIGH (ref 0.61–1.24)
GFR calc Af Amer: 44 mL/min — ABNORMAL LOW (ref >60)
GFR calc non Af Amer: 38 mL/min — ABNORMAL LOW (ref >60)
GLUCOSE: 39 mg/dL — AB (ref 65–99)
POTASSIUM: 3.5 mmol/L (ref 3.5–5.1)
Sodium: 132 mmol/L — ABNORMAL LOW (ref 135–145)
Total Bilirubin: 0.4 mg/dL (ref 0.3–1.2)
Total Protein: 6.8 g/dL (ref 6.5–8.1)

## 2017-07-28 LAB — GLUCOSE, CAPILLARY
Glucose-Capillary: 110 mg/dL — ABNORMAL HIGH (ref 65–99)
Glucose-Capillary: 138 mg/dL — ABNORMAL HIGH (ref 65–99)
Glucose-Capillary: 201 mg/dL — ABNORMAL HIGH (ref 65–99)
Glucose-Capillary: 285 mg/dL — ABNORMAL HIGH (ref 65–99)
Glucose-Capillary: 55 mg/dL — ABNORMAL LOW (ref 65–99)
Glucose-Capillary: 67 mg/dL (ref 65–99)
Glucose-Capillary: 98 mg/dL (ref 65–99)

## 2017-07-28 LAB — ECHOCARDIOGRAM COMPLETE
Height: 70 in
Weight: 3776 oz

## 2017-07-28 LAB — CBC
HEMATOCRIT: 34.4 % — AB (ref 39.0–52.0)
Hemoglobin: 11.7 g/dL — ABNORMAL LOW (ref 13.0–17.0)
MCH: 30.3 pg (ref 26.0–34.0)
MCHC: 34 g/dL (ref 30.0–36.0)
MCV: 89.1 fL (ref 78.0–100.0)
Platelets: 185 10*3/uL (ref 150–400)
RBC: 3.86 MIL/uL — ABNORMAL LOW (ref 4.22–5.81)
RDW: 14.2 % (ref 11.5–15.5)
WBC: 8 10*3/uL (ref 4.0–10.5)

## 2017-07-28 LAB — VOLATILES,BLD-ACETONE,ETHANOL,ISOPROP,METHANOL
Acetone, blood: NEGATIVE % (ref 0.000–0.010)
Ethanol, blood: NEGATIVE % (ref 0.000–0.010)
ISOPROPANOL, BLOOD: NEGATIVE % (ref 0.000–0.010)
METHANOL, BLOOD: NEGATIVE % (ref 0.000–0.010)

## 2017-07-28 LAB — TSH: TSH: 1.429 u[IU]/mL (ref 0.350–4.500)

## 2017-07-28 LAB — I-STAT CG4 LACTIC ACID, ED: LACTIC ACID, VENOUS: 1.39 mmol/L (ref 0.5–1.9)

## 2017-07-28 LAB — CBG MONITORING, ED
GLUCOSE-CAPILLARY: 94 mg/dL (ref 65–99)
GLUCOSE-CAPILLARY: 99 mg/dL (ref 65–99)
Glucose-Capillary: 21 mg/dL — CL (ref 65–99)

## 2017-07-28 LAB — URINALYSIS, ROUTINE W REFLEX MICROSCOPIC
BILIRUBIN URINE: NEGATIVE
Hgb urine dipstick: NEGATIVE
KETONES UR: NEGATIVE mg/dL
Leukocytes, UA: NEGATIVE
NITRITE: NEGATIVE
PH: 5 (ref 5.0–8.0)
Protein, ur: 30 mg/dL — AB
Specific Gravity, Urine: 1.016 (ref 1.005–1.030)

## 2017-07-28 LAB — CORTISOL: Cortisol, Plasma: 11.3 ug/dL

## 2017-07-28 MED ORDER — SODIUM CHLORIDE 0.9 % IV SOLN: 250.0000 mL | INTRAVENOUS | Status: DC | PRN

## 2017-07-28 MED ORDER — VALPROIC ACID 250 MG PO CAPS
250.0000 mg | ORAL_CAPSULE | Freq: Every day | ORAL | Status: DC
  Administered 2017-07-28: 250 mg via ORAL
  Filled 2017-07-28 (×2): qty 1

## 2017-07-28 MED ORDER — SODIUM CHLORIDE 0.9 % IV SOLN: INTRAVENOUS | Status: DC

## 2017-07-28 MED ORDER — DEXTROSE 50 % IV SOLN
INTRAVENOUS | Status: AC
  Filled 2017-07-28: qty 50

## 2017-07-28 MED ORDER — ENOXAPARIN SODIUM 40 MG/0.4ML ~~LOC~~ SOLN
40.0000 mg | SUBCUTANEOUS | Status: DC
  Administered 2017-07-28 – 2017-07-29 (×2): 40 mg via SUBCUTANEOUS
  Filled 2017-07-28 (×3): qty 0.4

## 2017-07-28 MED ORDER — GABAPENTIN 300 MG PO CAPS
600.0000 mg | ORAL_CAPSULE | Freq: Every day | ORAL | Status: DC
  Administered 2017-07-28 – 2017-07-29 (×2): 600 mg via ORAL
  Filled 2017-07-28 (×2): qty 2

## 2017-07-28 MED ORDER — ASPIRIN EC 81 MG PO TBEC
81.0000 mg | DELAYED_RELEASE_TABLET | Freq: Every day | ORAL | Status: DC
  Administered 2017-07-28 – 2017-07-29 (×2): 81 mg via ORAL
  Filled 2017-07-28 (×2): qty 1

## 2017-07-28 MED ORDER — LACTULOSE 10 GM/15ML PO SOLN
10.0000 g | Freq: Every day | ORAL | Status: DC
  Administered 2017-07-28 – 2017-07-30 (×2): 10 g via ORAL
  Filled 2017-07-28 (×3): qty 15

## 2017-07-28 MED ORDER — GABAPENTIN 300 MG PO CAPS: 300.0000 mg | ORAL_CAPSULE | ORAL | Status: DC

## 2017-07-28 MED ORDER — PANTOPRAZOLE SODIUM 40 MG PO TBEC
40.0000 mg | DELAYED_RELEASE_TABLET | Freq: Every day | ORAL | Status: DC
  Administered 2017-07-28 – 2017-07-30 (×3): 40 mg via ORAL
  Filled 2017-07-28 (×3): qty 1

## 2017-07-28 MED ORDER — INSULIN ASPART 100 UNIT/ML ~~LOC~~ SOLN
0.0000 [IU] | Freq: Three times a day (TID) | SUBCUTANEOUS | Status: DC
  Administered 2017-07-28: 8 [IU] via SUBCUTANEOUS
  Administered 2017-07-28: 5 [IU] via SUBCUTANEOUS
  Administered 2017-07-29: 3 [IU] via SUBCUTANEOUS
  Administered 2017-07-29 – 2017-07-30 (×2): 8 [IU] via SUBCUTANEOUS

## 2017-07-28 MED ORDER — SODIUM CHLORIDE 0.9 % IV SOLN
500.0000 mg | Freq: Once | INTRAVENOUS | Status: AC
  Administered 2017-07-28: 500 mg via INTRAVENOUS
  Filled 2017-07-28: qty 5

## 2017-07-28 MED ORDER — ADULT MULTIVITAMIN W/MINERALS CH
1.0000 | ORAL_TABLET | Freq: Every day | ORAL | Status: DC
  Administered 2017-07-28 – 2017-07-30 (×3): 1 via ORAL
  Filled 2017-07-28 (×3): qty 1

## 2017-07-28 MED ORDER — INSULIN ASPART 100 UNIT/ML ~~LOC~~ SOLN: 0.0000 [IU] | Freq: Every day | SUBCUTANEOUS | Status: DC

## 2017-07-28 MED ORDER — VALPROIC ACID 250 MG PO CAPS: 250.0000 mg | ORAL_CAPSULE | Freq: Every day | ORAL | Status: DC

## 2017-07-28 MED ORDER — GABAPENTIN 300 MG PO CAPS
300.0000 mg | ORAL_CAPSULE | Freq: Every day | ORAL | Status: DC
  Administered 2017-07-28 – 2017-07-30 (×3): 300 mg via ORAL
  Filled 2017-07-28 (×4): qty 1

## 2017-07-28 MED ORDER — ACETAMINOPHEN 650 MG RE SUPP: 650.0000 mg | Freq: Four times a day (QID) | RECTAL | Status: DC | PRN

## 2017-07-28 MED ORDER — FOLIC ACID 1 MG PO TABS
1.0000 mg | ORAL_TABLET | Freq: Every day | ORAL | Status: DC
  Administered 2017-07-28 – 2017-07-30 (×3): 1 mg via ORAL
  Filled 2017-07-28 (×3): qty 1

## 2017-07-28 MED ORDER — LORAZEPAM 2 MG/ML IJ SOLN: 1.0000 mg | Freq: Four times a day (QID) | INTRAMUSCULAR | Status: DC | PRN

## 2017-07-28 MED ORDER — ACETAMINOPHEN 325 MG PO TABS
650.0000 mg | ORAL_TABLET | Freq: Four times a day (QID) | ORAL | Status: DC | PRN
  Administered 2017-07-28: 650 mg via ORAL
  Filled 2017-07-28: qty 2

## 2017-07-28 MED ORDER — ATORVASTATIN CALCIUM 80 MG PO TABS
80.0000 mg | ORAL_TABLET | Freq: Every day | ORAL | Status: DC
  Administered 2017-07-28 – 2017-07-30 (×3): 80 mg via ORAL
  Filled 2017-07-28 (×3): qty 1

## 2017-07-28 MED ORDER — ENOXAPARIN SODIUM 30 MG/0.3ML ~~LOC~~ SOLN: 30.0000 mg | SUBCUTANEOUS | Status: DC

## 2017-07-28 MED ORDER — DIVALPROEX SODIUM ER 500 MG PO TB24
500.0000 mg | ORAL_TABLET | Freq: Every day | ORAL | Status: DC
  Administered 2017-07-28: 500 mg via ORAL
  Filled 2017-07-28: qty 1

## 2017-07-28 MED ORDER — LEVETIRACETAM 250 MG PO TABS
250.0000 mg | ORAL_TABLET | Freq: Two times a day (BID) | ORAL | Status: DC
  Administered 2017-07-28 (×2): 250 mg via ORAL
  Filled 2017-07-28 (×3): qty 1

## 2017-07-28 MED ORDER — ONDANSETRON HCL 4 MG/2ML IJ SOLN: 4.0000 mg | Freq: Four times a day (QID) | INTRAMUSCULAR | Status: DC | PRN

## 2017-07-28 MED ORDER — DEXTROSE 50 % IV SOLN
25.0000 mL | Freq: Once | INTRAVENOUS | Status: AC
  Administered 2017-07-28: 25 mL via INTRAVENOUS
  Filled 2017-07-28: qty 50

## 2017-07-28 MED ORDER — LORAZEPAM 1 MG PO TABS: 1.0000 mg | ORAL_TABLET | Freq: Four times a day (QID) | ORAL | Status: DC | PRN

## 2017-07-28 MED ORDER — INSULIN ASPART 100 UNIT/ML ~~LOC~~ SOLN: 0.0000 [IU] | Freq: Three times a day (TID) | SUBCUTANEOUS | Status: DC

## 2017-07-28 MED ORDER — DEXTROSE 50 % IV SOLN
1.0000 | Freq: Once | INTRAVENOUS | Status: AC
  Administered 2017-07-28: 50 mL via INTRAVENOUS

## 2017-07-28 MED ORDER — ONDANSETRON HCL 4 MG PO TABS
4.0000 mg | ORAL_TABLET | Freq: Four times a day (QID) | ORAL | Status: DC | PRN
Start: 2017-07-28 — End: 2017-07-30

## 2017-07-28 MED ORDER — VITAMIN B-1 100 MG PO TABS
100.0000 mg | ORAL_TABLET | Freq: Every day | ORAL | Status: DC
  Administered 2017-07-28 – 2017-07-30 (×3): 100 mg via ORAL
  Filled 2017-07-28 (×3): qty 1

## 2017-07-28 MED ORDER — INFLUENZA VAC SPLIT QUAD 0.5 ML IM SUSY
0.5000 mL | PREFILLED_SYRINGE | INTRAMUSCULAR | Status: AC
  Administered 2017-07-30: 0.5 mL via INTRAMUSCULAR
  Filled 2017-07-28 (×2): qty 0.5

## 2017-07-28 NOTE — H&P (Signed)
PULMONARY / CRITICAL CARE MEDICINE   Name: Jeffrey [name] MRN: [id number] DOB: 1965/10/25    ADMISSION DATE:  07/27/2017   CHIEF COMPLAINT:  Falls at home/ multiple seizures/ neck pain  HISTORY OF PRESENT ILLNESS:   51 year old male with pmx of cardiac arrest 2014, anoxic encephalopathy with seizure disorder, CKD, htn, IDDM, presents for multiple falls at home secondary to multiple seizures at home. Patient also complaining of persistent neck pain x 2 days and thinks he hurt hurt his neck when he fell. Patient has aura before he has a seizure which consist of leg numbness/weakness before he has seizure. He also states he has been getting dizzy and light headed when he stands and feels his legs get weak momentarily.   Patient was recently discharged for hyperglycemia. Denies any chest pain, shortness of breath, abdominal pain , fever, N/V/D. + poor po intake.   Patient was having intermittent hypotension in the ER that was not responsive to IVF x 5 Liter. His BP would constantly rise and fall between MAPs of 60-90. Asymptomatic   PAST MEDICAL HISTORY :  He  has a past medical history of Acute renal insufficiency (12/08/2012); AKI (acute kidney injury) ([place]); ALCOHOL ABUSE, HX OF (11/10/2007); Anoxic encephalopathy ([place]); Anxiety; Bleeding ulcer (2014); Blood transfusion (2014); Cardiac arrest ([place]) (12/07/2012); GERD (gastroesophageal reflux disease); High cholesterol; Hypertension; OSA on CPAP (12/07/2012); Seizure-like activity ([place]); Type II diabetes mellitus ([place]); and Venous insufficiency (12/13/2011).  PAST SURGICAL HISTORY: He  has a past surgical history that includes Knee arthroscopy (Left, ~ 1999); Esophagogastroduodenoscopy (Left, 11/26/2012); Cardiovascular stress test (03/16/13); Cataract extraction w/ intraocular lens implant (Left, 03/07/2014); and Esophagogastroduodenoscopy (N/A, 10/10/2014).  Allergies  Allergen Reactions  . Lisinopril Other (See Comments)    Pt reports nose bleed.     No current facility-administered medications on file prior to encounter.    Current Outpatient Prescriptions on File Prior to Encounter  Medication Sig  . aspirin EC 81 MG tablet Take 81 mg by mouth at bedtime.   [name] atorvastatin (LIPITOR) 80 MG tablet Take 1 tablet (80 mg total) by mouth daily. (Patient taking differently: Take 80 mg by mouth at bedtime. )  . divalproex (DEPAKOTE ER) 500 MG 24 hr tablet Take 1 tablet (500 mg total) by mouth 2 (two) times daily. (Patient taking differently: Take 500 mg by mouth at bedtime. )  . feeding supplement, GLUCERNA SHAKE, (GLUCERNA SHAKE) LIQD Take 237 mLs by mouth 2 (two) times daily between meals. (Patient taking differently: Take 237 mLs by mouth daily. )  . folic acid (FOLVITE) 1 MG tablet Take 1 tablet (1 mg total) by mouth daily. (Patient taking differently: Take 1 mg by mouth at bedtime. )  . gabapentin (NEURONTIN) 300 MG capsule Take 1 capsule (300 mg total) by mouth 4 (four) times daily. (Patient taking differently: Take 300-600 mg by mouth See admin instructions. Take 1 capsule (300 mg) by mouth daily at noon and 2 capsules (600 mg) at bedtime)  . hydrochlorothiazide (HYDRODIURIL) 25 MG tablet Take 1 tablet (25 mg total) by mouth daily. (Patient taking differently: Take 25 mg by mouth at bedtime. )  . Insulin Glulisine (APIDRA SOLOSTAR) 100 UNIT/ML Solostar Pen Take 2 units two times a day with your Glucerna as long as your blood sugar is higher than 200. Take 5 units with your evening meal as long as your blood sugar is higher than 100. (Patient taking differently: Inject 5 Units into the skin See admin instructions. Inject 5  units subcutaneously up to two times daily before large meals)  . lactulose (CHRONULAC) 10 GM/15ML solution Take 15 mLs (10 g total) by mouth daily.  . metoprolol (LOPRESSOR) 100 MG tablet TAKE 1 Tablet  BY MOUTH TWICE DAILY (Patient taking differently: TAKE 1 TABLET (100 MG) BY MOUTH TWICE DAILY - AFTERNOON AND BEDTIME)  .  Multiple Vitamin (MULTIVITAMIN WITH MINERALS) TABS tablet Take 1 tablet by mouth at bedtime.   . naproxen (NAPROSYN) 500 MG tablet Take 1 tablet (500 mg total) by mouth 2 (two) times daily with a meal. (Patient taking differently: Take 500 mg by mouth 2 (two) times daily. Afternoon and bedtime)  . olmesartan (BENICAR) 40 MG tablet Take 1 tablet (40 mg total) by mouth daily. (Patient taking differently: Take 40 mg by mouth at bedtime. )  . omeprazole (PRILOSEC) 20 MG capsule Take 1 capsule (20 mg total) by mouth daily. (Patient taking differently: Take 40 mg by mouth at bedtime. )  . tadalafil (CIALIS) 10 MG tablet Take 1 tablet (10 mg total) by mouth daily as needed for erectile dysfunction.  . thiamine (VITAMIN B-1) 100 MG tablet Take 1 tablet (100 mg total) by mouth daily. (Patient taking differently: Take 100 mg by mouth at bedtime. )  . cyclobenzaprine (FLEXERIL) 10 MG tablet Take 1 tablet (10 mg total) by mouth 3 (three) times daily as needed for muscle spasms. (Patient not taking: Reported on 06/09/2017)  . polyvinyl alcohol (LIQUIFILM TEARS) 1.4 % ophthalmic solution Place 1 drop into both eyes every 4 (four) hours as needed for dry eyes. (Patient not taking: Reported on 07/27/2017)    FAMILY HISTORY:  His indicated that his mother is deceased. He indicated that his father is alive.    SOCIAL HISTORY: He  reports that he has never smoked. His smokeless tobacco use includes Snuff. He reports that he drinks about 3.6 oz of alcohol per week . He reports that he does not use drugs.  REVIEW OF SYSTEMS:  As per HPI   VITAL SIGNS: BP 100/69   Pulse 89   Temp (!) 97.4 F (36.3 C) (Oral)   Resp 13   Ht 5\' 10"  (1.778 m)   Wt 236 lb (107 kg)   SpO2 100%   BMI 33.86 kg/m   INTAKE / OUTPUT: I/O last 3 completed shifts: In: 4105 [I.V.:1000; IV Piggyback:3105] Out: -   PHYSICAL EXAMINATION: General:  Male appropriate for age in NAD answering all questions appropriate.  Neuro:  AAox3 no  slurring of speech no focal deficits +facial symmetry EOMI, PERRLA mucus membranes dry  HEENT:  tongue midline no cervical tenderness  Cardiovascular:  s1s2 rrr Lungs:  Good air entry b/l no W/R/R Abdomen:  Soft non tender +bS Musculoskeletal:  + pulses in all 4 ext 5/5 muscle strength in all 4 ext no lower ext edema  Skin:  Dry and intact   LABS:  BMET  Recent Labs Lab 07/27/17 1650 07/28/17 0420  NA 128* 132*  K 4.2 3.5  CL 90* 104  CO2 24 20*  BUN 32* 29*  CREATININE 2.86* 1.94*  GLUCOSE 273* 39*    Electrolytes  Recent Labs Lab 07/27/17 1650 07/28/17 0420  CALCIUM 8.9 8.1*    CBC  Recent Labs Lab 07/27/17 1650 07/28/17 0420  WBC 10.9* 8.0  HGB 12.4* 11.7*  HCT 35.9* 34.4*  PLT 185 185    Coag's  Recent Labs Lab 07/27/17 1928  INR 0.99    Sepsis Markers  Recent Labs  Lab 07/27/17 2306 07/28/17 0308  LATICACIDVEN 2.18* 1.39    ABG No results for input(s): PHART, PCO2ART, PO2ART in the last 168 hours.  Liver Enzymes  Recent Labs Lab 07/27/17 2146 07/28/17 0420  AST 60* 61*  ALT 32 35  ALKPHOS 152* 160*  BILITOT 0.8 0.4  ALBUMIN 2.5* 2.7*    Cardiac Enzymes No results for input(s): TROPONINI, PROBNP in the last 168 hours.  Glucose  Recent Labs Lab 07/27/17 1651 07/28/17 0534 07/28/17 0601 07/28/17 0628  GLUCAP 268* 21* 94 99    Imaging Ct Head Wo Contrast  Result Date: 07/27/2017 CLINICAL DATA:  Recent falls.  Neck pain EXAM: CT HEAD WITHOUT CONTRAST CT CERVICAL SPINE WITHOUT CONTRAST TECHNIQUE: Multidetector CT imaging of the head and cervical spine was performed following the standard protocol without intravenous contrast. Multiplanar CT image reconstructions of the cervical spine were also generated. COMPARISON:  CT head 07/10/2017 FINDINGS: CT HEAD FINDINGS Brain: Moderate atrophy. Negative for acute infarct. Negative for hemorrhage or mass. Vascular: Negative for hyperdense vessel Skull: Negative for skull fracture  Sinuses/Orbits: Negative Other: None CT CERVICAL SPINE FINDINGS Alignment: Normal alignment.  Cervical kyphosis Skull base and vertebrae: Negative for fracture Soft tissues and spinal canal: Negative for mass. Carotid artery calcification bilaterally. Disc levels: Disc degeneration and spondylosis at C4-5 causing mild spinal stenosis. Mild disc degeneration at C3-4 and C6-7. Upper chest: Negative Other: None IMPRESSION: Moderate atrophy.  No acute intracranial abnormality Cervical spine degenerative change.  Negative for fracture. Electronically Signed   By: Franchot Gallo M.D.   On: 07/27/2017 18:48   Ct Cervical Spine Wo Contrast  Result Date: 07/27/2017 CLINICAL DATA:  Recent falls.  Neck pain EXAM: CT HEAD WITHOUT CONTRAST CT CERVICAL SPINE WITHOUT CONTRAST TECHNIQUE: Multidetector CT imaging of the head and cervical spine was performed following the standard protocol without intravenous contrast. Multiplanar CT image reconstructions of the cervical spine were also generated. COMPARISON:  CT head 07/10/2017 FINDINGS: CT HEAD FINDINGS Brain: Moderate atrophy. Negative for acute infarct. Negative for hemorrhage or mass. Vascular: Negative for hyperdense vessel Skull: Negative for skull fracture Sinuses/Orbits: Negative Other: None CT CERVICAL SPINE FINDINGS Alignment: Normal alignment.  Cervical kyphosis Skull base and vertebrae: Negative for fracture Soft tissues and spinal canal: Negative for mass. Carotid artery calcification bilaterally. Disc levels: Disc degeneration and spondylosis at C4-5 causing mild spinal stenosis. Mild disc degeneration at C3-4 and C6-7. Upper chest: Negative Other: None IMPRESSION: Moderate atrophy.  No acute intracranial abnormality Cervical spine degenerative change.  Negative for fracture. Electronically Signed   By: Franchot Gallo M.D.   On: 07/27/2017 18:48   Dg Chest Port 1 View  Result Date: 07/27/2017 CLINICAL DATA:  51 y/o  M; mild chest pain and shortness. EXAM:  PORTABLE CHEST 1 VIEW COMPARISON:  10/27/2015 chest radiograph FINDINGS: Stable heart size and mediastinal contours are within normal limits. Both lungs are clear. The visualized skeletal structures are unremarkable. IMPRESSION: No active disease. Electronically Signed   By: Kristine Garbe M.D.   On: 07/27/2017 23:58     STUDIES:  ECHO: Carotid Doppler:   CULTURES: Blood cultures 10/22  ANTIBIOTICS: none  SIGNIFICANT EVENTS:  LINES/TUBES: PIV  DISCUSSION 52 year old male with seizure disorder non compliant with meds, IDDM ,HTN, ETOH abuse, presents for falls and multiple seizures. Multiple episodes of hypotension in ED non responsive to fluids.   ASSESSMENT / PLAN:  PULMONARY A: no active issues at this time   P:   Monitor  Saturating  well in room air  CARDIOVASCULAR A: Intermittent Hypotension secondary to unclear source  Orthostatic hypotension at home  P:  F/u echo F/u carotid doppler  Was aggressively volume resuscitated in ED with 5L NS.  D/C IVF at this time.  Monitor tele  Hypotension mainly seen when patient is sleeping. Resolves when awake Possible orthostatic hypotension secondary to decreased PO intake. Will monitor and repeat    RENAL A:  Acute on chronic acute renal failure Hyponatremia secondary to hypovolemic hyponatremia  Lactic acidosis  P:   Cr improved with IVF Monitor I/O NA improved. Will encourage better PO intake Lactic acidosis resolved. Mostly secondary to hypovolemia   GASTROINTESTINAL A:  No acute issues  P:   Resume diet  PPI  HEMATOLOGIC A:  No active issues at this time  P:  Labs WNL  INFECTIOUS A:  R/O infectious process for intermittent hypotension P:   F/u blood cultures No leukocytosis or fevers No clear clinical evidence of sepsis at this time. Will hold of abx   ENDOCRINE A:  IDDM with hypoglycemia  P:   Will restart home regimen of insulin in AM  Encourage better Po intake  F/u TSH and  cortisol  NEUROLOGIC A:  Seizure disorder secondary to anoxic brain injury s/p cardiac arrest with poor compliance to AED ETOH abuse  P:   neurology consult seen. Given keppra load in the ED and started in Keppra 250mg  BID.  Resume Valproic acid for now and will need close follow up outpatient  [facility]WA protocol      [place] Pager: [phone]  07/28/2017, 7:53 AM

## 2017-07-28 NOTE — ED Notes (Signed)
Admitting provider aware of again decr'd BPs over the past 68min.

## 2017-07-28 NOTE — ED Notes (Signed)
Admitting at bedside 

## 2017-07-28 NOTE — Progress Notes (Signed)
CSW acknowledges consult. For medication needs and access to doctors please consult Case Management. CSW signing off.     Virgie Dad Ambera Fedele, MSW, [facility] Clinical Social Worker [phone]

## 2017-07-28 NOTE — ED Notes (Signed)
Critical care at bedside  

## 2017-07-28 NOTE — Progress Notes (Signed)
Patient placement called and patient being transferred to ICU

## 2017-07-28 NOTE — Progress Notes (Signed)
PULMONARY / CRITICAL CARE MEDICINE   Name: Jeffrey [name] MRN: [id number] DOB: 19-May-1966    ADMISSION DATE:  07/27/2017  CHIEF COMPLAINT:  Falls at home/ multiple seizures/ neck pain   HISTORY OF PRESENT ILLNESS:   51 year old male with PMH of cardiac arrest in 2014, anoxic encephalopathy with seizure disorder, CKD, HTN, IDDM, presents for multiple falls at home secondary to multiple seizures at home. Patient also complaining of persistent neck pain x 2 days and thinks he hurt hurt his neck when he fell. Patient has aura before he has a seizure which consist of leg numbness/weakness before he has seizure. He also states he has been getting dizzy and light headed when he stands and feels his legs get weak momentarily.   Patient was recently discharged for hyperglycemia. Denies any chest pain, shortness of breath, abdominal pain , fever, N/V/D. + poor po intake.   Patient was having intermittent hypotension in the ER that was not responsive to IVF x 5 Liter. His BP would constantly rise and fall between MAPs of 60-90. Asymptomatic.   REVIEW OF SYSTEMS:  As per HPI.   VITAL SIGNS: BP 132/88   Pulse 66   Temp 98.3 F (36.8 C) (Oral)   Resp (!) 32   Ht 5\' 10"  (1.778 m)   Wt 236 lb (107 kg)   SpO2 100%   BMI 33.86 kg/m   INTAKE / OUTPUT: I/O last 3 completed shifts: In: 4105 [I.V.:1000; IV Piggyback:3105] Out: -   PHYSICAL EXAMINATION: General:  51 yo male, NAD  Neuro:  AAO x3, normal speech, able to follow commands HEENT:  NCAT, EOMI, PERRL, MMM Cardiovascular:  RRR no MRG, 2+ pedal pulses Lungs:  CTAB, good air movement, no wheeze  Abdomen: Soft, NTND, +bs  Musculoskeletal:  No edema   Skin:  Warm, dry, no rash   LABS:  BMET  Recent Labs Lab 07/27/17 1650 07/28/17 0420  NA 128* 132*  K 4.2 3.5  CL 90* 104  CO2 24 20*  BUN 32* 29*  CREATININE 2.86* 1.94*  GLUCOSE 273* 39*    Electrolytes  Recent Labs Lab 07/27/17 1650 07/28/17 0420  CALCIUM 8.9 8.1*     CBC  Recent Labs Lab 07/27/17 1650 07/28/17 0420  WBC 10.9* 8.0  HGB 12.4* 11.7*  HCT 35.9* 34.4*  PLT 185 185    Coag's  Recent Labs Lab 07/27/17 1928  INR 0.99    Sepsis Markers  Recent Labs Lab 07/27/17 2306 07/28/17 0308  LATICACIDVEN 2.18* 1.39    ABG No results for input(s): PHART, PCO2ART, PO2ART in the last 168 hours.  Liver Enzymes  Recent Labs Lab 07/27/17 2146 07/28/17 0420  AST 60* 61*  ALT 32 35  ALKPHOS 152* 160*  BILITOT 0.8 0.4  ALBUMIN 2.5* 2.7*    Cardiac Enzymes No results for input(s): TROPONINI, PROBNP in the last 168 hours.  Glucose  Recent Labs Lab 07/27/17 1651 07/28/17 0534 07/28/17 0601 07/28/17 0628 07/28/17 0904 07/28/17 1140  GLUCAP 268* 21* 94 99 285* 201*    Imaging Ct Head Wo Contrast  Result Date: 07/27/2017 CLINICAL DATA:  Recent falls.  Neck pain EXAM: CT HEAD WITHOUT CONTRAST CT CERVICAL SPINE WITHOUT CONTRAST TECHNIQUE: Multidetector CT imaging of the head and cervical spine was performed following the standard protocol without intravenous contrast. Multiplanar CT image reconstructions of the cervical spine were also generated. COMPARISON:  CT head 07/10/2017 FINDINGS: CT HEAD FINDINGS Brain: Moderate atrophy. Negative for acute infarct. Negative  for hemorrhage or mass. Vascular: Negative for hyperdense vessel Skull: Negative for skull fracture Sinuses/Orbits: Negative Other: None CT CERVICAL SPINE FINDINGS Alignment: Normal alignment.  Cervical kyphosis Skull base and vertebrae: Negative for fracture Soft tissues and spinal canal: Negative for mass. Carotid artery calcification bilaterally. Disc levels: Disc degeneration and spondylosis at C4-5 causing mild spinal stenosis. Mild disc degeneration at C3-4 and C6-7. Upper chest: Negative Other: None IMPRESSION: Moderate atrophy.  No acute intracranial abnormality Cervical spine degenerative change.  Negative for fracture. Electronically Signed   By: Franchot Gallo M.D.   On: 07/27/2017 18:48   Ct Cervical Spine Wo Contrast  Result Date: 07/27/2017 CLINICAL DATA:  Recent falls.  Neck pain EXAM: CT HEAD WITHOUT CONTRAST CT CERVICAL SPINE WITHOUT CONTRAST TECHNIQUE: Multidetector CT imaging of the head and cervical spine was performed following the standard protocol without intravenous contrast. Multiplanar CT image reconstructions of the cervical spine were also generated. COMPARISON:  CT head 07/10/2017 FINDINGS: CT HEAD FINDINGS Brain: Moderate atrophy. Negative for acute infarct. Negative for hemorrhage or mass. Vascular: Negative for hyperdense vessel Skull: Negative for skull fracture Sinuses/Orbits: Negative Other: None CT CERVICAL SPINE FINDINGS Alignment: Normal alignment.  Cervical kyphosis Skull base and vertebrae: Negative for fracture Soft tissues and spinal canal: Negative for mass. Carotid artery calcification bilaterally. Disc levels: Disc degeneration and spondylosis at C4-5 causing mild spinal stenosis. Mild disc degeneration at C3-4 and C6-7. Upper chest: Negative Other: None IMPRESSION: Moderate atrophy.  No acute intracranial abnormality Cervical spine degenerative change.  Negative for fracture. Electronically Signed   By: Franchot Gallo M.D.   On: 07/27/2017 18:48   Dg Chest Port 1 View  Result Date: 07/27/2017 CLINICAL DATA:  51 y/o  M; mild chest pain and shortness. EXAM: PORTABLE CHEST 1 VIEW COMPARISON:  10/27/2015 chest radiograph FINDINGS: Stable heart size and mediastinal contours are within normal limits. Both lungs are clear. The visualized skeletal structures are unremarkable. IMPRESSION: No active disease. Electronically Signed   By: Kristine Garbe M.D.   On: 07/27/2017 23:58    STUDIES:  ECHO >> Carotid Doppler >>   CULTURES: Blood cultures 10/22  ANTIBIOTICS: None   SIGNIFICANT EVENTS: ICU Admit 10/22  LINES/TUBES: PIV   DISCUSSION 51 year old male with seizure disorder non-compliant with meds,  IDDM ,HTN, ETOH abuse, presents for falls and multiple seizures. Multiple episodes of hypotension in ED non responsive to fluids.   ASSESSMENT / PLAN:  PULMONARY No active issues at this time - able to protect airway  Saturating well on RA  Monitor   CARDIOVASCULAR Intermittent Hypotension secondary to unclear source - Resolved  Orthostatic hypotension at home - possibly related to decreased po intake F/u echo  F/u carotid doppler  Was aggressively volume resuscitated in ED with 5L NS D/c IVF  Monitor tele   RENAL Acute on chronic acute renal failure Hyponatremia secondary to hypovolemic hyponatremia  Lactic acidosis  Cr improved with IVF Monitor I/O NA improved. Will encourage better PO intake Lactic acidosis resolved. Mostly secondary to hypovolemia   GASTROINTESTINAL No acute issues  Carb modified diet  PPI  HEMATOLOGIC No active issues at this time  Labs WNL  INFECTIOUS R/O infectious process for intermittent hypotension F/u blood cultures No leukocytosis or fevers No clear clinical evidence of sepsis at this time. Will hold off on abx   ENDOCRINE IDDM with hypoglycemia  Encourage better po intake  TSH and cortisol wnl  Restart home med when able   NEUROLOGIC Seizure disorder  secondary to anoxic brain injury s/p cardiac arrest with poor compliance to AED ETOH abuse  Neurology consult seen. Given keppra load in the ED and started in Keppra 250mg  BID.  Resume Valproic acid for now and will need close follow up outpatient  Encourage medication adherence Monitor for etoh withdrawal Tox screen    Lovenia Kim, MD [facility], PGY-2  07/28/2017, 3:02 PM   STAFF NOTE: Linwood Dibbles, MD FACP have personally reviewed patient's available data, including medical history, events of note, physical examination and test results as part of my evaluation. I have discussed with resident/NP and other care providers such as pharmacist, RN and RRT. In addition, I  personally evaluated patient and elicited key findings of: awake alert, JVD wnl now, lungs without crackles, abdo soft, NT, ND< no r/g, no edema, no neck pain, pcxr which I reviewed did not show infiltrate, CT head and neck I reviewed did not show acute changes, he gives a history of multiple days of intermittent seizure activity and likely presents with shock related to hypovolemia, he also has Acute renal failure, ATN, rhabdo hypovolemic related lkely, he has received over 4.1 liters and is improving, he also has hyponatremia whoch also was likely related to volume status, continued volume, assess echo, avoid line as able, cortisol was adaquate, TSH wnl, high risk etoh ed, monitr closely, neuro to assess anti seziue meds, increase home depakote to home dose, control glu values to NICE, assess tox screen, tele, MAP goal 65 and follow for urine output 0.5 cc/kg/hr, I updated pt in full The patient is critically ill with multiple organ systems failure and requires high complexity decision making for assessment and support, frequent evaluation and titration of therapies, application of advanced monitoring technologies and extensive interpretation of multiple databases.   Critical Care Time devoted to patient care services described in this note is 45 Minutes. This time reflects time of care of this signee: Merrie Roof, MD FACP. This critical care time does not reflect procedure time, or teaching time or supervisory time of PA/NP/Med student/Med Resident etc but could involve care discussion time. Rest per NP/medical resident whose note is outlined above and that I agree with   Lavon Paganini. Titus Mould, MD, [place] Pgr: [facility] 07/28/2017 8:57 PM

## 2017-07-28 NOTE — Progress Notes (Signed)
Patient was initially seen and admitted to [facility] for AKI and neck pain in the setting of recent falls secondary to seizures. Patient was stable at the time of admission. While awaiting bed placement in the ED, patient BP continue to drop despite IVF bolus. Patient continue to be AOX4, however there were concerned about sustained hypotension. CCM was consulted (Dr. Vaughan Browner) and recommendations was to continue with IVF bolus, hypotension was likely secondary to hypovolemia given AKI on admission. Patient continue to have fluctuating BP and was intermittently hypotensive while maintaining MAP>60. Despite 5L of IVF patient was still unable to maintain BP above 017 systolic and 80 diastolic. LA was 2.8 despite IVF.  Blood cultures were sent and CXR was wnl. Patient was not started on antibx. CCM was reconsulted and team was sent bedside. Patient was evaluated and followed by Dr. Eartha Inch throughout the night. Although patient was in no acute distress, he continue to be hypotensive and the decision was made to admit to CCM for close observation and possible need for pressors. FMTS greatly appreciate Dr. Vaughan Browner and Dr. Elsworth Soho help in the care of this patient. FMTS will continue to follow patient while in the MICU and will resume his care upon discharge from the unit.  Marjie Skiff, MD [place], PGY-2

## 2017-07-28 NOTE — Progress Notes (Signed)
  Echocardiogram 2D Echocardiogram has been performed.  Darlina Sicilian M 07/28/2017, 12:44 PM

## 2017-07-28 NOTE — Consult Note (Addendum)
NEURO HOSPITALIST CONSULT NOTE   Requestig physician: Dr. Andria Frames  Reason for Consult: Breakthrough seizures  History obtained from:  Patient and Chart     HPI:                                                                                                                                          Jeffrey [name] is an 51 y.o. male who presents to the ED following multiple reported seizures at home. Also with nagging posterior neck pain x 2 days, which patient states is his primary reason for coming in for evaluation. He states that his last seizure was 2 days ago while in the bathroom; it was preceded with jerking; the next thing he remembers he was lying on the floor. He got up and made plans to go shopping. He states that he had about 3 seizures last week and about 3 this week. He also endorses orthostatic type symptoms when standing. He was recently admitted 3 weeks ago for hyperglycemia and dehydration.  Recently, he has had decreased [email or web address] of both food and liquids.    PMHx also includes AKI, EtOH abuse, cardiac arrest with anoxic encephalopathy, bleeding ulcer, hypercholesterolemia, HTN, DM2, diabetic neuropathy and venous insufficiency.   Labs: Serum ETOH level was 360. Na low at 128 BUN/Cr 32/2.86 Elevated AST of 61 with normal ALT, suggestive of recent or ongoing EtOH abuse Ammonia normalized since prior visits to 34 Elevated CBG WBC 10.9  CT head: Moderate atrophy. No acute intracranial abnormality.  Past Medical History:  Diagnosis Date  . Acute renal insufficiency 12/08/2012  . AKI (acute kidney injury) ([place])   . ALCOHOL ABUSE, HX OF 11/10/2007  . Anoxic encephalopathy ([place])   . Anxiety   . Bleeding ulcer 2014  . Blood transfusion 2014   "related to bleeding ulcer"  . Cardiac arrest ([place]) 12/07/2012   Anoxic encephalopathy  . GERD (gastroesophageal reflux disease)   . High cholesterol   . Hypertension   . OSA on CPAP 12/07/2012  . Seizure-like  activity ([place])   . Type II diabetes mellitus ([place])   . Venous insufficiency 12/13/2011    Past Surgical History:  Procedure Laterality Date  . CARDIOVASCULAR STRESS TEST  03/16/13   Very Poor Exercise Tolerance; NON DIAGNOSTIC TEST  . CATARACT EXTRACTION W/ INTRAOCULAR LENS IMPLANT Left 03/07/2014   [place] @ [facility]  . ESOPHAGOGASTRODUODENOSCOPY Left 11/26/2012   Procedure: ESOPHAGOGASTRODUODENOSCOPY (EGD);  Surgeon: Wonda Horner, MD;  Location: [facility] ENDOSCOPY;  Service: Endoscopy;  Laterality: Left;  . ESOPHAGOGASTRODUODENOSCOPY N/A 10/10/2014   Procedure: ESOPHAGOGASTRODUODENOSCOPY (EGD);  Surgeon: Ladene Artist, MD;  Location: [facility] ENDOSCOPY;  Service: Endoscopy;  Laterality: N/A;  . KNEE ARTHROSCOPY Left ~ 1999    Family History  Problem Relation  Age of Onset  . Other Mother        Unsure of medical history.  . Diabetes Mellitus II Father    Social History:  reports that he has never smoked. His smokeless tobacco use includes Snuff. He reports that he drinks about 3.6 oz of alcohol per week . He reports that he does not use drugs.  Allergies  Allergen Reactions  . Lisinopril Other (See Comments)    Pt reports nose bleed.    HOME MEDICATIONS:                                                                                                                     Tylenol ASA Atorvastatin Depakote ER 500 mg BID (taking differently: 500 mg at bedtime) Glucerna shake Folate Neurontin 300 mg QID HCTZ Lantus Apidra Solostar Lactulose Metoprolol MVI Naproxen Benicar Prilosec Cialis Thiamine Flexerol Liquifilm tears  ROS:                                                                                                                                       Does not endorse fevers, vomiting, diarrhea, dysphasia or lateralized weakness. Other ROS as per HPI.   Blood pressure 95/62, pulse 88, temperature (!) 97.4 F (36.3 C), temperature source Oral, resp. rate 17,  height 5\' 10"  (1.778 m), weight 107 kg (236 lb), SpO2 98 %.  General Examination:                                                                                                      General: Morbidly obese. Mildly decreased muscle bulk x 4.   HEENT-  [place]/AT    Lungs- Respirations unlabored Extremities- Dry skin noted. Trophic changes to distal lower extremities. Healing eschar right anterior leg.   Neurological Examination Mental Status: Alert, fully oriented, thought content appropriate. Speech fluent with intact comprehension and naming.  Able to follow a 3-step directional command without difficulty. Cranial Nerves: II: Visual fields intact; no extinction to DSS.  Fixates and tracks normally.  III,IV, VI: EOM full horizontally and vertically without nystagmus. Saccadic visual pursuits noted when tracking towards the right.  V,VII: Smile symmetric, facial temp sensation normal bilaterally VIII: hearing intact to conversation IX,X: no hypophonia XI: Symmetric shoulder shrug XII: midline tongue extension Motor: Right : Upper extremity   5/5    Left:     Upper extremity   5/5  Lower extremity   4+/5    Lower extremity   4+/5 Normal tone throughout; mildly decreased muscle bulk x 4.  Sensory: Temp and light touch intact x 4 without extinction.  Deep Tendon Reflexes:  1+ brachioradialis and biceps. Trace patellae, 0 achilles.  Plantars: Deferred due to painful distal neuropathy.  Cerebellar: No ataxia with FNF bilaterally Gait: Deferred  Lab Results: Basic Metabolic Panel:  Recent Labs Lab 07/27/17 1650  NA 128*  K 4.2  CL 90*  CO2 24  GLUCOSE 273*  BUN 32*  CREATININE 2.86*  CALCIUM 8.9    Liver Function Tests:  Recent Labs Lab 07/27/17 2146  AST 60*  ALT 32  ALKPHOS 152*  BILITOT 0.8  PROT 6.3*  ALBUMIN 2.5*   No results for input(s): LIPASE, AMYLASE in the last 168 hours.  Recent Labs Lab 07/27/17 2146  AMMONIA 34    CBC:  Recent Labs Lab  07/27/17 1650  WBC 10.9*  NEUTROABS 7.8*  HGB 12.4*  HCT 35.9*  MCV 88.9  PLT 185    Cardiac Enzymes: No results for input(s): CKTOTAL, CKMB, CKMBINDEX, TROPONINI in the last 168 hours.  Lipid Panel: No results for input(s): CHOL, TRIG, HDL, CHOLHDL, VLDL, LDLCALC in the last 168 hours.  CBG:  Recent Labs Lab 07/27/17 1651  GLUCAP 39*    Microbiology: Results for orders placed or performed during the hospital encounter of 07/10/17  MRSA PCR Screening     Status: None   Collection Time: 07/11/17  3:13 PM  Result Value Ref Range Status   MRSA by PCR NEGATIVE NEGATIVE Final    Comment:        The GeneXpert MRSA Assay (FDA approved for NASAL specimens only), is one component of a comprehensive MRSA colonization surveillance program. It is not intended to diagnose MRSA infection nor to guide or monitor treatment for MRSA infections.     Coagulation Studies:  Recent Labs  07/27/17 1928  LABPROT 13.0  INR 0.99    Imaging: Ct Head Wo Contrast  Result Date: 07/27/2017 CLINICAL DATA:  Recent falls.  Neck pain EXAM: CT HEAD WITHOUT CONTRAST CT CERVICAL SPINE WITHOUT CONTRAST TECHNIQUE: Multidetector CT imaging of the head and cervical spine was performed following the standard protocol without intravenous contrast. Multiplanar CT image reconstructions of the cervical spine were also generated. COMPARISON:  CT head 07/10/2017 FINDINGS: CT HEAD FINDINGS Brain: Moderate atrophy. Negative for acute infarct. Negative for hemorrhage or mass. Vascular: Negative for hyperdense vessel Skull: Negative for skull fracture Sinuses/Orbits: Negative Other: None CT CERVICAL SPINE FINDINGS Alignment: Normal alignment.  Cervical kyphosis Skull base and vertebrae: Negative for fracture Soft tissues and spinal canal: Negative for mass. Carotid artery calcification bilaterally. Disc levels: Disc degeneration and spondylosis at C4-5 causing mild spinal stenosis. Mild disc degeneration at C3-4  and C6-7. Upper chest: Negative Other: None IMPRESSION: Moderate atrophy.  No acute intracranial abnormality Cervical spine degenerative change.  Negative for fracture. Electronically Signed   By: Franchot Gallo M.D.   On: 07/27/2017 18:48   Ct Cervical Spine Wo Contrast  Result Date:  07/27/2017 CLINICAL DATA:  Recent falls.  Neck pain EXAM: CT HEAD WITHOUT CONTRAST CT CERVICAL SPINE WITHOUT CONTRAST TECHNIQUE: Multidetector CT imaging of the head and cervical spine was performed following the standard protocol without intravenous contrast. Multiplanar CT image reconstructions of the cervical spine were also generated. COMPARISON:  CT head 07/10/2017 FINDINGS: CT HEAD FINDINGS Brain: Moderate atrophy. Negative for acute infarct. Negative for hemorrhage or mass. Vascular: Negative for hyperdense vessel Skull: Negative for skull fracture Sinuses/Orbits: Negative Other: None CT CERVICAL SPINE FINDINGS Alignment: Normal alignment.  Cervical kyphosis Skull base and vertebrae: Negative for fracture Soft tissues and spinal canal: Negative for mass. Carotid artery calcification bilaterally. Disc levels: Disc degeneration and spondylosis at C4-5 causing mild spinal stenosis. Mild disc degeneration at C3-4 and C6-7. Upper chest: Negative Other: None IMPRESSION: Moderate atrophy.  No acute intracranial abnormality Cervical spine degenerative change.  Negative for fracture. Electronically Signed   By: Franchot Gallo M.D.   On: 07/27/2017 18:48   Dg Chest Port 1 View  Result Date: 07/27/2017 CLINICAL DATA:  51 y/o  M; mild chest pain and shortness. EXAM: PORTABLE CHEST 1 VIEW COMPARISON:  10/27/2015 chest radiograph FINDINGS: Stable heart size and mediastinal contours are within normal limits. Both lungs are clear. The visualized skeletal structures are unremarkable. IMPRESSION: No active disease. Electronically Signed   By: Kristine Garbe M.D.   On: 07/27/2017 23:58    Assessment: 52 year old male with  seizure disorder and multiple medical comorbidities, presenting with increased seizure frequency at home 1. Valproic acid level is subtherapeutic at 13, secondary to partial compliance with medication regimen 2. Neurological exam non-lateralizing. Mentation intact. 3. CT shows diffuse atrophy without acute abnormality  Recommendations: 1. Given liver disease, would hold off on increasing valproic acid back to prescribed BID dose for now, but would not discontinue given the risk of precipitating seizures 2. Will need to cover with an alternate anticonvulsant. Keppra is a reasonable choice as it can be given IV, has good efficacy and is not hepatically metabolized. However, it will need to be renally dosed in this patient. Recommend 500 mg IV load followed by 250 mg PO or IV BID 3. Discontinue home naproxen given AKI. Patient should be educated regarding the nephotoxic potential of this medication.  4. IV volume resuscitation. 5. Gradual correction of serum Na.  6. CIWA protocol. Felt to be at high risk for withdrawal seizure.   7. If the patient can maintain abstinence from alcohol and liver function continues to improve, can restart BID dosing of valproic acid. Will need to have levels closely followed as an outpatient. Once valproic acid level is therapeutic, could consider tapering off Keppra.  8. Per [facility] statutes, patients with seizures are not allowed to drive until  they have been seizure-free for six months. Use caution when using heavy equipment or power tools. Avoid working on ladders or at heights. Take showers instead of baths. Ensure the water temperature is not too high on the home water heater. Do not go swimming alone. When caring for infants or small children, sit down when holding, feeding, or changing them to minimize risk of injury to the child in the event you have a seizure.   Electronically signed: Dr. Kerney Elbe 07/28/2017, 3:30 AM

## 2017-07-29 ENCOUNTER — Inpatient Hospital Stay (HOSPITAL_COMMUNITY): Payer: Self-pay

## 2017-07-29 DIAGNOSIS — R55 Syncope and collapse: Secondary | ICD-10-CM

## 2017-07-29 DIAGNOSIS — R569 Unspecified convulsions: Secondary | ICD-10-CM

## 2017-07-29 LAB — CBC
HCT: 30.4 % — ABNORMAL LOW (ref 39.0–52.0)
Hemoglobin: 10 g/dL — ABNORMAL LOW (ref 13.0–17.0)
MCH: 29.3 pg (ref 26.0–34.0)
MCHC: 32.9 g/dL (ref 30.0–36.0)
MCV: 89.1 fL (ref 78.0–100.0)
Platelets: 177 10*3/uL (ref 150–400)
RBC: 3.41 MIL/uL — ABNORMAL LOW (ref 4.22–5.81)
RDW: 14.3 % (ref 11.5–15.5)
WBC: 6.2 10*3/uL (ref 4.0–10.5)

## 2017-07-29 LAB — BASIC METABOLIC PANEL
Anion gap: 6 (ref 5–15)
BUN: 21 mg/dL — ABNORMAL HIGH (ref 6–20)
CO2: 23 mmol/L (ref 22–32)
Calcium: 8.3 mg/dL — ABNORMAL LOW (ref 8.9–10.3)
Chloride: 104 mmol/L (ref 101–111)
Creatinine, Ser: 1.37 mg/dL — ABNORMAL HIGH (ref 0.61–1.24)
GFR calc Af Amer: >60 mL/min (ref >60)
GFR calc non Af Amer: 57 mL/min — ABNORMAL LOW (ref >60)
Glucose, Bld: 108 mg/dL — ABNORMAL HIGH (ref 65–99)
Potassium: 3.9 mmol/L (ref 3.5–5.1)
Sodium: 133 mmol/L — ABNORMAL LOW (ref 135–145)

## 2017-07-29 LAB — GLUCOSE, CAPILLARY
Glucose-Capillary: 191 mg/dL — ABNORMAL HIGH (ref 65–99)
Glucose-Capillary: 266 mg/dL — ABNORMAL HIGH (ref 65–99)
Glucose-Capillary: 46 mg/dL — ABNORMAL LOW (ref 65–99)
Glucose-Capillary: 58 mg/dL — ABNORMAL LOW (ref 65–99)
Glucose-Capillary: 82 mg/dL (ref 65–99)

## 2017-07-29 LAB — MAGNESIUM: Magnesium: 1.9 mg/dL (ref 1.7–2.4)

## 2017-07-29 LAB — PHOSPHORUS: Phosphorus: 2.9 mg/dL (ref 2.5–4.6)

## 2017-07-29 MED ORDER — METOPROLOL TARTRATE 100 MG PO TABS
100.0000 mg | ORAL_TABLET | Freq: Two times a day (BID) | ORAL | Status: DC
  Administered 2017-07-29 – 2017-07-30 (×2): 100 mg via ORAL
  Filled 2017-07-29 (×2): qty 1

## 2017-07-29 MED ORDER — IBUPROFEN 100 MG PO CHEW
200.0000 mg | CHEWABLE_TABLET | Freq: Three times a day (TID) | ORAL | Status: DC | PRN
  Filled 2017-07-29: qty 2

## 2017-07-29 MED ORDER — DIVALPROEX SODIUM ER 500 MG PO TB24
500.0000 mg | ORAL_TABLET | Freq: Two times a day (BID) | ORAL | Status: DC
  Filled 2017-07-29: qty 1

## 2017-07-29 MED ORDER — DIVALPROEX SODIUM ER 500 MG PO TB24
500.0000 mg | ORAL_TABLET | Freq: Two times a day (BID) | ORAL | Status: DC
  Administered 2017-07-29 – 2017-07-30 (×3): 500 mg via ORAL
  Filled 2017-07-29 (×3): qty 1

## 2017-07-29 MED ORDER — LEVETIRACETAM 500 MG PO TABS
500.0000 mg | ORAL_TABLET | Freq: Two times a day (BID) | ORAL | Status: DC
  Administered 2017-07-29 – 2017-07-30 (×3): 500 mg via ORAL
  Filled 2017-07-29 (×3): qty 1

## 2017-07-29 NOTE — Progress Notes (Signed)
Subjective: No further seizures.  Asking for description of his events, he states that he feels shaky and tremulous all over, then he goes out, but is only out for a second.  When he comes to, he is immediately back to his normal self.  This happens 2-3 times per week typically.  He states that he has not been eating or drinking very well for a couple of years.  On admission, his creatinine was up, presumed from dehydration.  He was hypotensive.   Exam: Vitals:   07/29/17 0700 07/29/17 0800  BP:  (!) 151/103  Pulse: 79 (!) 121  Resp: 17 16  Temp:    SpO2: 100% 93%   Gen: In bed, NAD Resp: non-labored breathing, no acute distress Abd: soft, nt  Neuro: MS: Awake, alert, oriented and appropriate CN: Visual fields full, face symmetric Motor: Moves all extremities well Sensory: Intact light touch  Pertinent Labs: Creatinine improved from 1.9-1.3  Impression: 51 year old male with seizures since anoxic brain injury.  Apparently, he also has some degree of liver disease my partner has recommended levetiracetam, but I am not sure if he has enough evidence of liver dysfunction to warrant discontinuation.  Importantly, with the description of the event, they sound more like syncope than seizures.  He never loses consciousness while sitting or laying down.   Recommendations: 1) Keppra 500 mg twice daily 2) Depakote 500 mg twice daily, could consider weaning to off. 3) continue gabapentin 4) if he continues to have spells as an outpatient, I would consider ambulatory EEG monitoring. 5) no further recommend issues at this time, please call further questions or concerns.   Roland Rack, MD [facility] [phone]  If 7pm- 7am, please page neurology on call as listed in [place].

## 2017-07-29 NOTE — Progress Notes (Signed)
Family Medicine Teaching Service Daily Progress Note Intern Pager: [phone]  Patient name: Jeffrey [name] Medical record number: [id number] Date of birth: 1966/08/03 Age: 51 y.o. Gender: male  Primary Care Provider: Sela Hilding, MD Consultants: neurology Code Status: full  Pt Overview and Major Events to Date:  10/22 Admitted to ICU, echo performed 10/23 Carotid doppler, neurology signed off  Assessment and Plan: Hypotension Patient with intermittent hypotension in the ED on 10/21. Received 5L of normal saline at that time. When admitted to ICU, hypotension resolved and was felt to likely be orthostatic 2/2 to decreased PO intake. Patient with pressure of 151/103 in am of 10/23. Will continue to watch these pressures. Fluid hydration as needed. Now that he is hypertensive will plan to restart his home metoprolol today. - vital signs per floor - restart metoprolol 100mg  10/23 - will continue to hold hctz, olmesartan - f/u carotid doppler ultrasound - pt/ot eval and treat today  Tachycardia Patient with intermittent tachycardia. Appears that this is closely related to his pain. Only had a few episodes of tachycardia but will continue to monitor. Restarting metoprolol 10/23. - vital signs per floor - consider ekg if having more episodes  Neck pain Patient complaining of neck pain since fall around 1 week ago. Appears to be musculoskeletal on exam and presentation. Imaging negative for any fracture. Will consider starting muscle relaxer in combo with tylenol if he has more significant pain. Will no do NSAID due to aki present on arrival. - continue tylenol - will consider adding muscle relaxer - continue gabapentin  Seizure Patient with several episodes of seizure-like activity last week prior to discharge. Being evaluated by neurology. Patient is taking depakote as outpatient although there is some question regarding how compliant he has been. Per neurology recommendations  will continue keppra 500mg  bid, depakote 500mg  bid. Will consider weaning off depakote. Will likely discharge him with both for management as outpatient. - keppra 500mg  bid - depakote 500mg  bid - follow up with neurology as outpatinet - continue gabapentin  Acute kidney injury Creatinine on admission was 2.86. Has been improving with increased po intake and iv hydration. Creatinine from am of 10/23 1.37 which is down from 1.94. Baseline appears to be 1.05 based on previous admssion. - holding nephrotoxic meds - encourage po intake - IV fluid at kvo - daily bmp   ETOH abuse Patient is on CIWA protocol. Patient with multiple scores of 0 over the last two days. Will continue to monitor. - ciwa protocol - continue folic acid - continue thiamine  Type II diabetes Sugars have been well controlled on SSI. 16U aspart needed last 24 hours. Highest sugar 268. Lowest 98.  Hyponatremia Very slight hyponatremia on 10/23 am bmp (133). Will continue to monitor on daily bmp. Can correct as needed.  FEN/GI: NS at kvo, carb-modified PPx: lovenox, ciwa  Disposition: likely home  Subjective:  Doing well this morning. Eating breakfast.  Still having some intermittent neck pain. No complaints.  Objective: Temp:  [97.6 F (36.4 C)-98.4 F (36.9 C)] 97.6 F (36.4 C) (10/23 0824) Pulse Rate:  [65-166] 121 (10/23 0800) Resp:  [0-32] 16 (10/23 0800) BP: (114-153)/(74-103) 151/103 (10/23 0800) SpO2:  [91 %-100 %] 93 % (10/23 0800) Weight:  [231 lb 4.2 oz (104.9 kg)] 231 lb 4.2 oz (104.9 kg) (10/23 0445) Physical Exam: General: obese, alert, up at edge of bed eating. Oriented x3. Cardiovascular: tachycardic on exam, no m/r/g. Palpable radial pulses bilaterally Respiratory: lungs clear to ausculation bilaterally,  no increased work of breathing Abdomen: soft, non-tender, non-distended. Bowel sounds present Extremities: 5/5 strength BLE, BUE. MSK: Patient with palpable pain in posterior neck lateral  to spine bilaterally. Reproducible  Laboratory:  Recent Labs Lab 07/27/17 1650 07/28/17 0420 07/29/17 0308  WBC 10.9* 8.0 6.2  HGB 12.4* 11.7* 10.0*  HCT 35.9* 34.4* 30.4*  PLT 185 185 177    Recent Labs Lab 07/27/17 1650 07/27/17 2146 07/28/17 0420 07/29/17 0308  NA 128*  --  132* 133*  K 4.2  --  3.5 3.9  CL 90*  --  104 104  CO2 24  --  20* 23  BUN 32*  --  29* 21*  CREATININE 2.86*  --  1.94* 1.37*  CALCIUM 8.9  --  8.1* 8.3*  PROT  --  6.3* 6.8  --   BILITOT  --  0.8 0.4  --   ALKPHOS  --  152* 160*  --   ALT  --  32 35  --   AST  --  60* 61*  --   GLUCOSE 273*  --  39* 108*    Imaging/Diagnostic Tests: Study Result   CLINICAL DATA:  51 y/o  M; mild chest pain and shortness.  EXAM: PORTABLE CHEST 1 VIEW  COMPARISON:  10/27/2015 chest radiograph  FINDINGS: Stable heart size and mediastinal contours are within normal limits. Both lungs are clear. The visualized skeletal structures are unremarkable.  IMPRESSION: No active disease.    Guadalupe Dawn, MD 07/29/2017, 9:16 AM PGY-1, [place] Intern pager: [phone], text pages welcome

## 2017-07-29 NOTE — Progress Notes (Addendum)
PCP Social Note:   Called patient to check in. States he is doing ok. He is confused about whether he has seizures or not. States he has been getting somewhat mixed messages from different teams. May need continued education. Feels he is "not getting any answers." Has transportation for follow up, but follow up with specialists may be impacted by the patient reporting he has limited funds to pay for this. Appreciate the excellent care of CCM, neurology, and FMTS teams.   Also complains of neck pain. Reviewed CT neck with no fracture and some DJD. Patient requested muscle relaxers, counseled that we cannot use this with seizures. Wrote for [facility] and counseled about how heat can loosen muscle spasms.   Ralene Ok, MD

## 2017-07-29 NOTE — Care Management Note (Signed)
Case Management Note  Patient Details  Name: Jeffrey [name] MRN: [id number] Date of Birth: 06-Mar-1966  Subjective/Objective:   Pt admitted with seizures and neck pain                 Action/Plan:   PTA from home.  Pt has new PCP appt at [facility] on 11/14 at 1pm.  Pt will likely need MATCH at discharge - CM will continue to follow for needs   Expected Discharge Date:                  Expected Discharge Plan:  Home/Self Care  In-House Referral:     Discharge planning Services  CM Consult, [facility]  Post Acute Care Choice:    Choice offered to:     DME Arranged:    DME Agency:     HH Arranged:    [employer] Agency:     Status of Service:     If discussed at [employer], dates discussed:    Additional Comments:  Maryclare Labrador, RN 07/29/2017, 9:28 AM

## 2017-07-30 LAB — BASIC METABOLIC PANEL
Anion gap: 10 (ref 5–15)
BUN: 16 mg/dL (ref 6–20)
CO2: 23 mmol/L (ref 22–32)
Calcium: 8.9 mg/dL (ref 8.9–10.3)
Chloride: 103 mmol/L (ref 101–111)
Creatinine, Ser: 1.22 mg/dL (ref 0.61–1.24)
GFR calc Af Amer: >60 mL/min (ref >60)
GFR calc non Af Amer: >60 mL/min (ref >60)
Glucose, Bld: 108 mg/dL — ABNORMAL HIGH (ref 65–99)
Potassium: 4.2 mmol/L (ref 3.5–5.1)
Sodium: 136 mmol/L (ref 135–145)

## 2017-07-30 LAB — VAS US CAROTID
LEFT ECA DIAS: -11 cm/s
LEFT VERTEBRAL DIAS: -13 cm/s
Left CCA dist dias: -21 cm/s
Left CCA dist sys: -70 cm/s
Left CCA prox dias: 14 cm/s
Left CCA prox sys: 83 cm/s
Left ICA dist dias: -25 cm/s
Left ICA dist sys: -67 cm/s
Left ICA prox dias: -11 cm/s
Left ICA prox sys: -35 cm/s
RIGHT ECA DIAS: -6 cm/s
RIGHT VERTEBRAL DIAS: 20 cm/s
Right CCA prox dias: 4 cm/s
Right CCA prox sys: 84 cm/s
Right cca dist sys: -68 cm/s

## 2017-07-30 LAB — CBC
HCT: 33.8 % — ABNORMAL LOW (ref 39.0–52.0)
Hemoglobin: 11.1 g/dL — ABNORMAL LOW (ref 13.0–17.0)
MCH: 29.8 pg (ref 26.0–34.0)
MCHC: 32.8 g/dL (ref 30.0–36.0)
MCV: 90.9 fL (ref 78.0–100.0)
PLATELETS: 236 10*3/uL (ref 150–400)
RBC: 3.72 MIL/uL — AB (ref 4.22–5.81)
RDW: 14.6 % (ref 11.5–15.5)
WBC: 7.1 10*3/uL (ref 4.0–10.5)

## 2017-07-30 LAB — GLUCOSE, CAPILLARY
Glucose-Capillary: 81 mg/dL (ref 65–99)
Glucose-Capillary: 58 mg/dL — ABNORMAL LOW (ref 65–99)
Glucose-Capillary: 105 mg/dL — ABNORMAL HIGH (ref 65–99)
Glucose-Capillary: 109 mg/dL — ABNORMAL HIGH (ref 65–99)
Glucose-Capillary: 96 mg/dL (ref 65–99)
Glucose-Capillary: 281 mg/dL — ABNORMAL HIGH (ref 65–99)
Glucose-Capillary: 164 mg/dL — ABNORMAL HIGH (ref 65–99)

## 2017-07-30 MED ORDER — LEVETIRACETAM 500 MG PO TABS: 500.0000 mg | ORAL_TABLET | Freq: Two times a day (BID) | ORAL | 0 refills | Status: DC

## 2017-07-30 MED ORDER — ADJUST BATH/SHOWER SEAT MISC: 1.0000 [IU] | Freq: Once | 0 refills | Status: DC

## 2017-07-30 NOTE — Progress Notes (Signed)
Family Medicine Teaching Service Daily Progress Note Intern Pager: [phone]  Patient name: Jeffrey [name] Medical record number: [id number] Date of birth: [birth year] Age: 51 y.o. Gender: male  Primary Care Provider: Sela Hilding, MD Consultants: neurology Code Status: full  Pt Overview and Major Events to Date:  10/22 Admitted to ICU, echo performed 10/23 Carotid doppler, neurology signed off  Assessment and Plan: Hypotension Patient with intermittent hypotension in the ED on 10/21. Received 5L of normal saline at that time. When admitted to ICU, hypotension resolved and was felt to likely be orthostatic 2/2 to decreased PO intake. Patient with pressure of 151/103 in am of 10/23. Will continue to watch these pressures. Fluid hydration as needed. patinet with pressure of 120/80 this am. - vital signs per floor - continue metoprolol 100mg  - will continue to hold hctz, olmesartan - f/u carotid doppler ultrasound - pt/ot eval and treat today  Tachycardia Patient with intermittent tachycardia. Appears that this is closely related to his pain. Only had a few episodes of tachycardia but will continue to monitor. Continue metoprolol. HR in 80s. - vital signs per floor - consider ekg if having more episodes  Neck pain Patient complaining of neck pain since fall around 1 week ago. Appears to be musculoskeletal on exam and presentation. Imaging negative for any fracture. Will consider starting muscle relaxer in combo with tylenol if he has more significant pain. Will no do NSAID due to aki present on arrival. - continue tylenol - will prescribe k pad at discharge - continue gabapentin  Seizure Patient with several episodes of seizure-like activity last week prior to discharge. Being evaluated by neurology. Patient is taking depakote as outpatient although there is some question regarding how compliant he has been. Per neurology recommendations will continue keppra 500mg  bid, depakote  500mg  bid. Will consider weaning off depakote. Will likely discharge him with both for management as outpatient. - keppra 500mg  bid - depakote 500mg  bid - follow up with neurology as outpatinet - continue gabapentin  Acute kidney injury Creatinine on admission was 2.86. Has been improving with increased po intake and iv hydration. Creatinine from am of 10/23 1.37 which is down from 1.94. Baseline appears to be 1.05 based on previous admssion. - holding nephrotoxic meds - encourage po intake - IV fluid at kvo - daily bmp   ETOH abuse Patient is on CIWA protocol. Patient with multiple scores of 0 over the last two days. Will continue to monitor. - ciwa protocol - continue folic acid - continue thiamine  Type II diabetes Sugars have been well controlled on SSI. 16U aspart needed last 24 hours. Highest sugar 268. Lowest 98.  Hyponatremia Very slight hyponatremia on 10/23 am bmp (133). Will continue to monitor on daily bmp. Can correct as needed.  FEN/GI: NS at kvo, carb-modified PPx: lovenox, ciwa  Disposition: likely home  Subjective:  Doing well this morning. Eating breakfast.  NO complaints. Ready for dc  Objective: Temp:  [97.9 F (36.6 C)-98.1 F (36.7 C)] 98.1 F (36.7 C) (10/24 1325) Pulse Rate:  [61-80] 62 (10/24 1325) Resp:  [16-18] 16 (10/24 1325) BP: (110-148)/(75-91) 148/91 (10/24 1325) SpO2:  [100 %] 100 % (10/24 1325) Physical Exam: General: obese, alert, up at edge of bed eating. Oriented x3. Cardiovascular: tachycardic on exam, no m/r/g. Palpable radial pulses bilaterally Respiratory: lungs clear to ausculation bilaterally, no increased work of breathing Abdomen: soft, non-tender, non-distended. Bowel sounds present Extremities: 5/5 strength BLE, BUE. MSK: Patient with palpable pain in posterior  neck lateral to spine bilaterally. Reproducible  Laboratory:  Recent Labs Lab 07/28/17 0420 07/29/17 0308 07/30/17 0445  WBC 8.0 6.2 7.1  HGB 11.7* 10.0*  11.1*  HCT 34.4* 30.4* 33.8*  PLT 185 177 236    Recent Labs Lab 07/27/17 2146 07/28/17 0420 07/29/17 0308 07/30/17 0627  NA  --  132* 133* 136  K  --  3.5 3.9 4.2  CL  --  104 104 103  CO2  --  20* 23 23  BUN  --  29* 21* 16  CREATININE  --  1.94* 1.37* 1.22  CALCIUM  --  8.1* 8.3* 8.9  PROT 6.3* 6.8  --   --   BILITOT 0.8 0.4  --   --   ALKPHOS 152* 160*  --   --   ALT 32 35  --   --   AST 60* 61*  --   --   GLUCOSE  --  39* 108* 108*    Imaging/Diagnostic Tests: Study Result   CLINICAL DATA:  51 y/o  M; mild chest pain and shortness.  EXAM: PORTABLE CHEST 1 VIEW  COMPARISON:  10/27/2015 chest radiograph  FINDINGS: Stable heart size and mediastinal contours are within normal limits. Both lungs are clear. The visualized skeletal structures are unremarkable.  IMPRESSION: No active disease.    Guadalupe Dawn, MD 07/30/2017, 5:05 PM PGY-1, [place] Intern pager: [phone], text pages welcome

## 2017-07-30 NOTE — Evaluation (Signed)
Occupational Therapy Evaluation and Discharge Patient Details Name: Jeffrey [name] MRN: [id number] DOB: 1966/01/16 Today's Date: 07/30/2017    History of Present Illness Pt is a 51 y/o male admitted secondary to sustaining multiple falls at home secondary to seizures. Pt also found to be hypotensive. PMH including but not limited to CKD, DM, HTN and hx of MI in 2014 with anoxic brain injury.   Clinical Impression   PTA Pt independent in ADL and modified independent in mobility. Pt is currently at baseline for ADL and does not require further skilled OT. However, Pt and OT talked about safety and compensatory strategies and OT feels VERY STRONGLY that Pt would GREATLY benefit from a shower chair. Pt reports multiple falls in the bathroom and shower specifically. A shower chair would decrease the risk of falls and increase safety.   Follow Up Recommendations  No OT follow up    Equipment Recommendations  Tub/shower seat    Recommendations for Other Services PT consult     Precautions / Restrictions Precautions Precautions: Fall Restrictions Weight Bearing Restrictions: No      Mobility Bed Mobility               General bed mobility comments: Pt OOB in recliner when OT entered  Transfers Overall transfer level: Needs assistance Equipment used: None Transfers: Sit to/from Stand Sit to Stand: Supervision         General transfer comment: supervision for safety    Balance Overall balance assessment: Needs assistance Sitting-balance support: Feet supported Sitting balance-Leahy Scale: Good     Standing balance support: During functional activity;No upper extremity supported Standing balance-Leahy Scale: Fair                             ADL either performed or assessed with clinical judgement   ADL Overall ADL's : At baseline                                       General ADL Comments: Pt able to demonstrate LB dressing; fine  motor dexterity, good strength     Vision Baseline Vision/History: Wears glasses Wears Glasses: At all times Patient Visual Report: No change from baseline       Perception     Praxis      Pertinent Vitals/Pain Pain Assessment: Faces Faces Pain Scale: Hurts little more Pain Location: posterior neck Pain Descriptors / Indicators: Sore Pain Intervention(s): Monitored during session     Hand Dominance Right   Extremity/Trunk Assessment Upper Extremity Assessment Upper Extremity Assessment: Overall WFL for tasks assessed   Lower Extremity Assessment Lower Extremity Assessment: Overall WFL for tasks assessed   Cervical / Trunk Assessment Cervical / Trunk Assessment: Normal   Communication Communication Communication: No difficulties   Cognition Arousal/Alertness: Awake/alert Behavior During Therapy: WFL for tasks assessed/performed Overall Cognitive Status: Within Functional Limits for tasks assessed                                 General Comments: cognition not formally assessed but [facility] for general conversation; pt was alert, oriented and following commands appropriately   General Comments       Exercises     Shoulder Instructions      Home Living Family/patient expects to be discharged to:: Private  residence Living Arrangements: Spouse/significant other;Children Available Help at Discharge: Family Type of Home: House Home Access: Stairs to enter Technical brewer of Steps: 3 Entrance Stairs-Rails: None Home Layout: One level     Bathroom Shower/Tub: Teacher, early years/pre: [place] - single point   Additional Comments: Pt reports falling multiple times in the bathroom      Prior Functioning/Environment Level of Independence: Independent with assistive device(s)        Comments: uses SPC at all times, wife drives as pt cannot due to seizures- Pt reports multiple falls in the bathroom         OT Problem List: Decreased activity tolerance;Impaired balance (sitting and/or standing);Decreased knowledge of use of DME or AE      OT Treatment/Interventions:      OT Goals(Current goals can be found in the care plan section) Acute Rehab OT Goals Patient Stated Goal: return home OT Goal Formulation: With patient Time For Goal Achievement: 08/06/17 Potential to Achieve Goals: Good  OT Frequency:     Barriers to D/C:            Co-evaluation              AM-PAC PT "6 Clicks" Daily Activity     Outcome Measure Help from another person eating meals?: None Help from another person taking care of personal grooming?: None Help from another person toileting, which includes using toliet, bedpan, or urinal?: None Help from another person bathing (including washing, rinsing, drying)?: A Little Help from another person to put on and taking off regular upper body clothing?: None Help from another person to put on and taking off regular lower body clothing?: None 6 Click Score: 23   End of Session Equipment Utilized During Treatment: Gait belt Nurse Communication: Mobility status  Activity Tolerance: Patient tolerated treatment well Patient left: in chair;with chair alarm set;with call bell/phone within reach  OT Visit Diagnosis: Other abnormalities of gait and mobility (R26.89);Repeated falls (R29.6);History of falling (Z91.81)                Time: 1550-1605 OT Time Calculation (min): 15 min Charges:  OT General Charges $OT Visit: 1 Visit OT Evaluation $OT Eval Moderate Complexity: 1 Mod G-Codes:     Hulda Humphrey OTR/L [place] 07/30/2017, 4:09 PM

## 2017-07-30 NOTE — Evaluation (Signed)
Physical Therapy Evaluation Patient Details Name: GWEN EDLER MRN: [id number] DOB: August 29, 1966 Today's Date: 07/30/2017   History of Present Illness  Pt is a 51 y/o male admitted secondary to sustaining multiple falls at home secondary to seizures. Pt also found to be hypotensive. PMH including but not limited to CKD, DM, HTN and hx of MI in 2014 with anoxic brain injury.  Clinical Impression  Pt presented supine in bed with HOB elevated, awake and willing to participate in therapy session. Prior to admission, pt reported that he ambulates with use of SPC at all times and is independent with ADLs. Pt ambulated in hallway without use of an AD, with close min guard for safety. Pt with modest instability, drifting right and left and bumping into objects on his R side. However, no overt LOB or need for physical assistance. Pt would continue to benefit from skilled physical therapy services at this time while admitted and after d/c to address the below listed limitations in order to improve overall safety and independence with functional mobility.     Follow Up Recommendations Outpatient PT    Equipment Recommendations  None recommended by PT    Recommendations for Other Services       Precautions / Restrictions Precautions Precautions: Fall Restrictions Weight Bearing Restrictions: No      Mobility  Bed Mobility Overal bed mobility: Modified Independent                Transfers Overall transfer level: Needs assistance Equipment used: None Transfers: Sit to/from Stand Sit to Stand: Supervision         General transfer comment: supervision for safety  Ambulation/Gait Ambulation/Gait assistance: Min guard Ambulation Distance (Feet): 250 Feet Assistive device: None Gait Pattern/deviations: Step-through pattern;Decreased stride length;Drifts right/left Gait velocity: decreased Gait velocity interpretation: Below normal speed for age/gender General Gait Details: pt  with modest instability with laterally drifting towards the R more so than the L. Pt also bumping into objects on R side. No overt LOB or need for physical assistance, min guard for safety  Stairs            Wheelchair Mobility    Modified Rankin (Stroke Patients Only)       Balance Overall balance assessment: Needs assistance Sitting-balance support: Feet supported Sitting balance-Leahy Scale: Good     Standing balance support: During functional activity;No upper extremity supported Standing balance-Leahy Scale: Fair                               Pertinent Vitals/Pain Pain Assessment: Faces Faces Pain Scale: Hurts little more Pain Location: posterior neck Pain Descriptors / Indicators: Sore Pain Intervention(s): Monitored during session    Home Living Family/patient expects to be discharged to:: Private residence Living Arrangements: Spouse/significant other;Children Available Help at Discharge: Family Type of Home: House Home Access: Stairs to enter Entrance Stairs-Rails: None Technical brewer of Steps: 3 Home Layout: One level Home Equipment: Cane - single point      Prior Function Level of Independence: Independent with assistive device(s)         Comments: uses SPC at all times, wife drives as pt cannot due to seizures     Hand Dominance        Extremity/Trunk Assessment   Upper Extremity Assessment Upper Extremity Assessment: Overall WFL for tasks assessed    Lower Extremity Assessment Lower Extremity Assessment: Overall WFL for tasks assessed    Cervical /  Trunk Assessment Cervical / Trunk Assessment: Normal  Communication   Communication: No difficulties  Cognition Arousal/Alertness: Awake/alert Behavior During Therapy: WFL for tasks assessed/performed Overall Cognitive Status: Within Functional Limits for tasks assessed                                 General Comments: cognition not formally  assessed but [facility] for general conversation; pt was alert, oriented and following commands appropriately      General Comments      Exercises     Assessment/Plan    PT Assessment Patient needs continued PT services  PT Problem List Decreased balance;Decreased mobility;Decreased coordination;Decreased knowledge of use of DME;Decreased safety awareness;Decreased knowledge of precautions       PT Treatment Interventions DME instruction;Gait training;Stair training;Functional mobility training;Therapeutic activities;Therapeutic exercise;Balance training;Neuromuscular re-education;Patient/family education    PT Goals (Current goals can be found in the Care Plan section)  Acute Rehab PT Goals Patient Stated Goal: return home PT Goal Formulation: With patient Time For Goal Achievement: 08/13/17 Potential to Achieve Goals: Good    Frequency Min 3X/week   Barriers to discharge        Co-evaluation               AM-PAC PT "6 Clicks" Daily Activity  Outcome Measure Difficulty turning over in bed (including adjusting bedclothes, sheets and blankets)?: None Difficulty moving from lying on back to sitting on the side of the bed? : None Difficulty sitting down on and standing up from a chair with arms (e.g., wheelchair, bedside commode, etc,.)?: A Little Help needed moving to and from a bed to chair (including a wheelchair)?: None Help needed walking in hospital room?: A Little Help needed climbing 3-5 steps with a railing? : A Little 6 Click Score: 21    End of Session Equipment Utilized During Treatment: Gait belt Activity Tolerance: Patient tolerated treatment well Patient left: in chair;with call bell/phone within reach;with chair alarm set Nurse Communication: Mobility status PT Visit Diagnosis: Other abnormalities of gait and mobility (R26.89);Unsteadiness on feet (R26.81)    Time: 2952-8413 PT Time Calculation (min) (ACUTE ONLY): 15 min   Charges:   PT  Evaluation $PT Eval Moderate Complexity: 1 Mod     PT G Codes:        [place], PT, DPT [place] 07/30/2017, 11:12 AM

## 2017-07-30 NOTE — Discharge Summary (Signed)
[facility] Discharge Summary  Patient name: Jeffrey [name] Medical record number: [id number] Date of birth: July 24, 1966 Age: 51 y.o. Gender: male Date of Admission: 07/27/2017  Date of Discharge: 07/30/2017 Admitting Physician: Carlyon Prows, DO  Primary Care Provider: Sela Hilding, MD Consultants: Neurology  Indication for Hospitalization: Fall at home, multiple seizures  Discharge Diagnoses/Problem List:  Hypotension Tachycardia Neck pain Seizure aki etoh abuse Type II diabetes Hyponatremia  Disposition: home  Discharge Condition: good  Discharge Exam: General: obese, alert, up at edge of bed eating. Oriented x3. Cardiovascular: tachycardic on exam, no m/r/g. Palpable radial pulses bilaterally Respiratory: lungs clear to ausculation bilaterally, no increased work of breathing Abdomen: soft, non-tender, non-distended. Bowel sounds present Extremities: 5/5 strength BLE, BUE. MSK: Patient with palpable pain in posterior neck lateral to spine bilaterally. Reproducible  Brief Hospital Course:  51 year old who presents with multiple falls 2/2 possible seizure activity at home.  He states that he had an "aura" consisting of leg numbness/weakness and dizziness/light headedness prior to falling. Patient had intermittent hypotension in the emergency department. His pressures were 80/60s after 5L bolus. Patient had creatinine of 2.86 on admission.  Syncope/Seizures Patient was evaluated by neurology who did not believe the patient's falling was consistent with seizure-like activity. He was not taking his depakote as prescribed at home. He was started on keppra 500mg  and restarted on home regimen of depakote (bid dosing). An EEG was recommended as an outpatient. Regarding syncope workup the patient had a TTE which showed LVEF of 65-70%. He had bilateral carotid ultrasound which was significant only for R ICA 1-40% stenosis which was unlikely to cause  any syncope symptoms. Patient did have positive orthostatics. 143/93 lying, HR 59 and 116/89, HR 66 at 3 minutes standing. This was likely due to volume status and hypertension medications. He can have this worked up as an outpatient.  Acute Kidney Injury Patient with creatinine of 2.86 on admission. This was most likely pre-renal as patient's creatinine improved to 1.22 on 10/24 on just fluid hydration.  Hypertension/hypotension Patient initially hypotensive in ed which prompted ed stay. Once patient was volume repleted he actually become hypertensive. He was restarted on his home metoprolol on 10/23. His hctz and olmesartan were restarted on discharge.  Neck Pain Patient with neck pain that started with one of his falls. This was deemed to be musculoskeletal. He had a cspine ct on 10/21 which was negative for fracture. His tylenol and home gabapentin were continued while inpatient. Did not want to give any muscle relaxers as these can lower the seizure threshold. He was prescribed k-pads at discharge.  He has a follow up appointment for 10/31 at [facility].  He was initially admitted to the ICU. Workup in   Issues for Follow Up:  1. Follow up blood pressure, possibly change regimen if normotensive in office 2. Follow up neck pain, prescribed neck pain at discharge 3. Follow up getting transfer bench from [facility] 4. Follow up receiving depakote and keppra for discharge, can potentially discontinue one of these agents. 5. Consider outpatient EEG if continues to have spells 6. Follow up orthostatic hypotension, patient with poor hydration and several htn meds. Can consider weaning.  Significant Procedures: TTE, Bilateral carotid ultrasound  Significant Labs and Imaging:   Recent Labs Lab 07/28/17 0420 07/29/17 0308 07/30/17 0445  WBC 8.0 6.2 7.1  HGB 11.7* 10.0* 11.1*  HCT 34.4* 30.4* 33.8*  PLT 185 177 236    Recent  Labs Lab 07/27/17 1650 07/27/17 2146  07/28/17 0420 07/29/17 0308 07/30/17 0627  NA 128*  --  132* 133* 136  K 4.2  --  3.5 3.9 4.2  CL 90*  --  104 104 103  CO2 24  --  20* 23 23  GLUCOSE 273*  --  39* 108* 108*  BUN 32*  --  29* 21* 16  CREATININE 2.86*  --  1.94* 1.37* 1.22  CALCIUM 8.9  --  8.1* 8.3* 8.9  MG  --   --   --  1.9  --   PHOS  --   --   --  2.9  --   ALKPHOS  --  152* 160*  --   --   AST  --  60* 61*  --   --   ALT  --  32 35  --   --   ALBUMIN  --  2.5* 2.7*  --   --     Results/Tests Pending at Time of Discharge: none  Discharge Medications:  Allergies as of 07/30/2017      Reactions   Lisinopril Other (See Comments)   Pt reports nose bleed.      Medication List    STOP taking these medications   cyclobenzaprine 10 MG tablet Commonly known as:  FLEXERIL     TAKE these medications   acetaminophen 500 MG tablet Commonly known as:  TYLENOL Take 1,000 mg by mouth every 6 (six) hours as needed for headache (pain).   aspirin EC 81 MG tablet Take 81 mg by mouth at bedtime.   atorvastatin 80 MG tablet Commonly known as:  LIPITOR Take 1 tablet (80 mg total) by mouth daily. What changed:  when to take this   divalproex 500 MG 24 hr tablet Commonly known as:  DEPAKOTE ER Take 1 tablet (500 mg total) by mouth 2 (two) times daily. What changed:  when to take this   feeding supplement (GLUCERNA SHAKE) Liqd Take 237 mLs by mouth 2 (two) times daily between meals. What changed:  when to take this   folic acid 1 MG tablet Commonly known as:  FOLVITE Take 1 tablet (1 mg total) by mouth daily. What changed:  when to take this   gabapentin 300 MG capsule Commonly known as:  NEURONTIN Take 1 capsule (300 mg total) by mouth 4 (four) times daily. What changed:  how much to take  when to take this  additional instructions   hydrochlorothiazide 25 MG tablet Commonly known as:  HYDRODIURIL Take 1 tablet (25 mg total) by mouth daily. What changed:  when to take this   insulin glargine  100 unit/mL Sopn Commonly known as:  LANTUS Inject 15 Units into the skin daily before breakfast.   Insulin Glulisine 100 UNIT/ML Solostar Pen Commonly known as:  APIDRA SOLOSTAR Take 2 units two times a day with your Glucerna as long as your blood sugar is higher than 200. Take 5 units with your evening meal as long as your blood sugar is higher than 100. What changed:  how much to take  how to take this  when to take this  additional instructions   lactulose 10 GM/15ML solution Commonly known as:  CHRONULAC Take 15 mLs (10 g total) by mouth daily.   levETIRAcetam 500 MG tablet Commonly known as:  KEPPRA Take 1 tablet (500 mg total) by mouth 2 (two) times daily.   metoprolol tartrate 100 MG tablet Commonly known as:  LOPRESSOR  TAKE 1 Tablet  BY MOUTH TWICE DAILY What changed:  See the new instructions.   multivitamin with minerals Tabs tablet Take 1 tablet by mouth at bedtime.   olmesartan 40 MG tablet Commonly known as:  BENICAR Take 1 tablet (40 mg total) by mouth daily. What changed:  when to take this   omeprazole 20 MG capsule Commonly known as:  PRILOSEC Take 1 capsule (20 mg total) by mouth daily. What changed:  how much to take  when to take this   polyvinyl alcohol 1.4 % ophthalmic solution Commonly known as:  LIQUIFILM TEARS Place 1 drop into both eyes every 4 (four) hours as needed for dry eyes.   tadalafil 10 MG tablet Commonly known as:  CIALIS Take 1 tablet (10 mg total) by mouth daily as needed for erectile dysfunction.   thiamine 100 MG tablet Commonly known as:  VITAMIN B-1 Take 1 tablet (100 mg total) by mouth daily. What changed:  when to take this       Discharge Instructions: Please refer to Patient Instructions section of EMR for full details.  Patient was counseled important signs and symptoms that should prompt return to medical care, changes in medications, dietary instructions, activity restrictions, and follow up appointments.    Follow-Up Appointments: Follow-up Information    [place]. Go on 08/20/2017.   Why:  at 1pm Contact information: [place] [id number]       Eloise Levels, MD Follow up on 08/06/2017.   Specialty:  Family Medicine Why:  @ 2:45PM, please arrive 15 minutes early for this appointment Contact information: [place] [address] [phone]           Guadalupe Dawn, MD 07/30/2017, 5:07 PM PGY-1, [place]

## 2017-07-30 NOTE — Progress Notes (Signed)
Patient discharge teaching given, including activity, diet, follow-up appoints, and medications. Patient verbalized understanding of all discharge instructions. IV access was d/c'd. Vitals are stable. Skin is intact except as charted in most recent assessments. Pt to be escorted out by NT, to be driven home by family. 

## 2017-08-01 ENCOUNTER — Telehealth: Payer: Self-pay | Admitting: Family Medicine

## 2017-08-01 ENCOUNTER — Other Ambulatory Visit: Payer: Self-pay | Admitting: Family Medicine

## 2017-08-01 LAB — CULTURE, BLOOD (ROUTINE X 2)
Culture: NO GROWTH
Culture: NO GROWTH
SPECIAL REQUESTS: ADEQUATE
Special Requests: ADEQUATE

## 2017-08-01 MED ORDER — AZILSARTAN MEDOXOMIL 40 MG PO TABS: 40.0000 mg | ORAL_TABLET | Freq: Every day | ORAL | 3 refills | Status: DC

## 2017-08-01 NOTE — Telephone Encounter (Signed)
Received fax from [place] stating need to change from benicar to edarbi to get meds for free through [facility]. Changed benicar to ebarbi at 40mg  as dose should be equalivalent. Faxed to [facility].

## 2017-08-03 ENCOUNTER — Encounter (HOSPITAL_COMMUNITY): Payer: Self-pay | Admitting: Emergency Medicine

## 2017-08-03 ENCOUNTER — Telehealth: Payer: Self-pay | Admitting: Family Medicine

## 2017-08-03 ENCOUNTER — Emergency Department (HOSPITAL_COMMUNITY)
Admission: EM | Admit: 2017-08-03 | Discharge: 2017-08-03 | Disposition: A | Discharge status: Home / Self Care | Payer: Self-pay | Attending: Emergency Medicine | Admitting: Emergency Medicine

## 2017-08-03 ENCOUNTER — Emergency Department (HOSPITAL_COMMUNITY): Payer: Self-pay

## 2017-08-03 DIAGNOSIS — F10920 Alcohol use, unspecified with intoxication, uncomplicated: Secondary | ICD-10-CM | POA: Insufficient documentation

## 2017-08-03 DIAGNOSIS — I10 Essential (primary) hypertension: Secondary | ICD-10-CM | POA: Insufficient documentation

## 2017-08-03 DIAGNOSIS — Y908 Blood alcohol level of 240 mg/100 ml or more: Secondary | ICD-10-CM | POA: Insufficient documentation

## 2017-08-03 DIAGNOSIS — D849 Immunodeficiency, unspecified: Secondary | ICD-10-CM | POA: Insufficient documentation

## 2017-08-03 DIAGNOSIS — Z7982 Long term (current) use of aspirin: Secondary | ICD-10-CM | POA: Insufficient documentation

## 2017-08-03 DIAGNOSIS — Z79899 Other long term (current) drug therapy: Secondary | ICD-10-CM | POA: Insufficient documentation

## 2017-08-03 DIAGNOSIS — Z794 Long term (current) use of insulin: Secondary | ICD-10-CM | POA: Insufficient documentation

## 2017-08-03 DIAGNOSIS — F17220 Nicotine dependence, chewing tobacco, uncomplicated: Secondary | ICD-10-CM | POA: Insufficient documentation

## 2017-08-03 DIAGNOSIS — W19XXXA Unspecified fall, initial encounter: Secondary | ICD-10-CM | POA: Insufficient documentation

## 2017-08-03 DIAGNOSIS — E119 Type 2 diabetes mellitus without complications: Secondary | ICD-10-CM | POA: Insufficient documentation

## 2017-08-03 DIAGNOSIS — I252 Old myocardial infarction: Secondary | ICD-10-CM | POA: Insufficient documentation

## 2017-08-03 LAB — CBC
HEMATOCRIT: 35.7 % — AB (ref 39.0–52.0)
HEMOGLOBIN: 12 g/dL — AB (ref 13.0–17.0)
MCH: 30.2 pg (ref 26.0–34.0)
MCHC: 33.6 g/dL (ref 30.0–36.0)
MCV: 89.9 fL (ref 78.0–100.0)
Platelets: 393 10*3/uL (ref 150–400)
RBC: 3.97 MIL/uL — AB (ref 4.22–5.81)
RDW: 14.5 % (ref 11.5–15.5)
WBC: 7.4 10*3/uL (ref 4.0–10.5)

## 2017-08-03 LAB — COMPREHENSIVE METABOLIC PANEL
ALK PHOS: 125 U/L (ref 38–126)
ALT: 24 U/L (ref 17–63)
AST: 32 U/L (ref 15–41)
Albumin: 2.9 g/dL — ABNORMAL LOW (ref 3.5–5.0)
Anion gap: 9 (ref 5–15)
BILIRUBIN TOTAL: 0.4 mg/dL (ref 0.3–1.2)
BUN: 18 mg/dL (ref 6–20)
CALCIUM: 9.2 mg/dL (ref 8.9–10.3)
CO2: 27 mmol/L (ref 22–32)
CREATININE: 0.94 mg/dL (ref 0.61–1.24)
Chloride: 106 mmol/L (ref 101–111)
GFR calc non Af Amer: >60 mL/min (ref >60)
GLUCOSE: 143 mg/dL — AB (ref 65–99)
Potassium: 3.8 mmol/L (ref 3.5–5.1)
SODIUM: 142 mmol/L (ref 135–145)
TOTAL PROTEIN: 7.1 g/dL (ref 6.5–8.1)

## 2017-08-03 LAB — ETHANOL: Alcohol, Ethyl (B): 288 mg/dL — ABNORMAL HIGH (ref <10)

## 2017-08-03 NOTE — ED Notes (Signed)
Unable to get an oral temp on pt.

## 2017-08-03 NOTE — ED Notes (Signed)
Delay in lab draw. 

## 2017-08-03 NOTE — ED Notes (Signed)
Delay in lab draw,  Pt not in room 

## 2017-08-03 NOTE — ED Notes (Signed)
Strong smell of ETOH on patient's breath. Spouse reports patient has been drinking at least three 40's today.

## 2017-08-03 NOTE — ED Triage Notes (Signed)
Patient arrived to ED via [place] from [employer]. Patient fell at store approx 1730.  EMS reports:  Spouse insisted that patient be brought to ED for evaluation. ETOH on board. No LOC during fall. Patient answering some questions, and not answering other questions for EMS. Denies pain or injury. Edema noted in bilateral upper extremities.  C-collar in place.  BP 152/100, Pulse 58, Resp 16, 97% on room air.  CBG 143.

## 2017-08-03 NOTE — Discharge Instructions (Signed)
Call any of the numbers on the resource guide tomorrow to get help with your alcohol problem.  Get your blood pressure rechecked by your primary care physician or an urgent care center within the next 3 weeks.  Today's was elevated at 148/103

## 2017-08-03 NOTE — Telephone Encounter (Signed)
**  After Hours/ Emergency Line Call*  Received a call to report that Jeffrey [name] had passed out from his wife Sunday Spillers. She reports that he has continued to fall lately since discharge and just passed out, but has regained consciousness. Asking if she should call 911. I suggested that she should call 911 now. I am PCP, no need to route. Will follow for ED eval.    Ralene Ok, MD PGY-2, [facility]

## 2017-08-03 NOTE — ED Provider Notes (Signed)
[facility] EMERGENCY DEPARTMENT Provider Note   CSN: [id number] Arrival date & time: 08/03/17  1805     History   Chief Complaint Chief Complaint  Patient presents with  . Altered Mental Status  Chief complaint fall  HPI ALMA MUEGGE is a 51 y.o. male.  History is obtained from patient and from patient's wife.  Patient fell today while walking.  He admits to drinking 140 ounce beer at 9 AM today.  He drinks copious amounts of alcohol daily.  He denies pain anywhere.  Brought by EMS.  No treatment prior to coming here.  Treated with hard cervical collar prior to coming here.  HPI  Past Medical History:  Diagnosis Date  . Acute renal insufficiency 12/08/2012  . AKI (acute kidney injury) ([place])   . ALCOHOL ABUSE, HX OF 11/10/2007  . Anoxic encephalopathy ([place])   . Anxiety   . Bleeding ulcer 2014  . Blood transfusion 2014   "related to bleeding ulcer"  . Cardiac arrest ([place]) 12/07/2012   Anoxic encephalopathy  . GERD (gastroesophageal reflux disease)   . High cholesterol   . Hypertension   . OSA on CPAP 12/07/2012  . Seizure-like activity ([place])   . Type II diabetes mellitus ([place])   . Venous insufficiency 12/13/2011    Patient Active Problem List   Diagnosis Date Noted  . Sepsis ([place])   . Hypotension 07/27/2017  . Speech disturbance 07/03/2017  . DKA (diabetic ketoacidoses) ([place]) 06/09/2017  . Hyponatremia 04/08/2017  . Seizure ([place]) 04/08/2017  . Depression 12/17/2015  . Hip pain 12/17/2015  . Proteinuria 11/23/2015  . Loss of teeth 11/23/2015  . Essential hypertension   . Altered mental status 09/12/2015  . Diabetic polyneuropathy associated with type 2 diabetes mellitus ([place]) 08/04/2015  . Hepatic cirrhosis ([place]) 03/22/2015  . Ataxia 03/20/2015  . Aphasia   . Elevated liver enzymes   . Dysarthria 03/09/2015  . AKI (acute kidney injury) ([place])   . Alcohol intoxication ([place])   . Alcohol abuse   . Erosive esophagitis 10/10/2014  . HLD  (hyperlipidemia)   . Passed out [facility]) 10/08/2014  . Hyperglycemia   . Syncope and collapse   . Dehydration 05/18/2014  . OSA on CPAP 12/28/2012  . Pulmonary edema 12/08/2012  . Cardiac arrest ([place]) 12/07/2012  . Obesity 12/07/2012  . Anoxic brain injury ([place]) 12/07/2012  . GI bleed 11/26/2012  . Venous insufficiency 12/13/2011  . 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 ALCOHOL ABUSE, HX OF 11/10/2007  . ERECTILE DYSFUNCTION 04/14/2007  . Type 2 diabetes mellitus with hemoglobin A1c goal of less than 7.5% ([place]) 12/04/2006  . HYPERCHOLESTEROLEMIA 12/04/2006  . OBESITY, NOS 12/04/2006  . Anxiety state 12/04/2006  . HYPERTENSION, BENIGN SYSTEMIC 12/04/2006  . ARTHRITIS 12/04/2006    Past Surgical History:  Procedure Laterality Date  . CARDIOVASCULAR STRESS TEST  03/16/13   Very Poor Exercise Tolerance; NON DIAGNOSTIC TEST  . CATARACT EXTRACTION W/ INTRAOCULAR LENS IMPLANT Left 03/07/2014   [place] @ [facility]  . ESOPHAGOGASTRODUODENOSCOPY Left 11/26/2012   Procedure: ESOPHAGOGASTRODUODENOSCOPY (EGD);  Surgeon: Wonda Horner, MD;  Location: [facility] ENDOSCOPY;  Service: Endoscopy;  Laterality: Left;  . ESOPHAGOGASTRODUODENOSCOPY N/A 10/10/2014   Procedure: ESOPHAGOGASTRODUODENOSCOPY (EGD);  Surgeon: Ladene Artist, MD;  Location: [facility] ENDOSCOPY;  Service: Endoscopy;  Laterality: N/A;  . KNEE ARTHROSCOPY Left ~ 1999       Home Medications    Prior to Admission medications  Medication Sig Start Date End Date Taking? Authorizing Provider  acetaminophen (TYLENOL) 500 MG tablet Take 1,000 mg by mouth every 6 (six) hours as needed for headache (pain).    [provider]  aspirin EC 81 MG tablet Take 81 mg by mouth at bedtime.     [provider]  atorvastatin (LIPITOR) 80 MG tablet Take 1 tablet (80 mg total) by mouth daily. Patient taking differently: Take 80 mg by mouth at bedtime.  10/10/16   Zenia Resides, MD  Azilsartan Medoxomil 40 MG  TABS Take 40 mg by mouth daily. 08/01/17   Sela Hilding, MD  divalproex (DEPAKOTE ER) 500 MG 24 hr tablet Take 1 tablet (500 mg total) by mouth 2 (two) times daily. Patient taking differently: Take 500 mg by mouth at bedtime.  06/26/17   Kinnie Feil, MD  feeding supplement, GLUCERNA SHAKE, (GLUCERNA SHAKE) LIQD Take 237 mLs by mouth 2 (two) times daily between meals. Patient taking differently: Take 237 mLs by mouth daily.  06/12/17   Smiley Houseman, MD  folic acid (FOLVITE) 1 MG tablet Take 1 tablet (1 mg total) by mouth daily. Patient taking differently: Take 1 mg by mouth at bedtime.  05/27/16   [name], DO  gabapentin (NEURONTIN) 300 MG capsule Take 1 capsule (300 mg total) by mouth 4 (four) times daily. Patient taking differently: Take 300-600 mg by mouth See admin instructions. Take 1 capsule (300 mg) by mouth daily at noon and 2 capsules (600 mg) at bedtime 06/26/17   Andrena Mews T, MD  hydrochlorothiazide (HYDRODIURIL) 25 MG tablet Take 1 tablet (25 mg total) by mouth daily. Patient taking differently: Take 25 mg by mouth at bedtime.  05/08/17   Sela Hilding, MD  insulin glargine (LANTUS) 100 unit/mL SOPN Inject 15 Units into the skin daily before breakfast.    [provider]  Insulin Glulisine (APIDRA SOLOSTAR) 100 UNIT/ML Solostar Pen Take 2 units two times a day with your Glucerna as long as your blood sugar is higher than 200. Take 5 units with your evening meal as long as your blood sugar is higher than 100. Patient taking differently: Inject 5 Units into the skin See admin instructions. Inject 5 units subcutaneously up to two times daily before large meals 06/26/17   Zenia Resides, MD  lactulose (CHRONULAC) 10 GM/15ML solution Take 15 mLs (10 g total) by mouth daily. 07/03/17   Carlyle Dolly, MD  levETIRAcetam (KEPPRA) 500 MG tablet Take 1 tablet (500 mg total) by mouth 2 (two) times daily. 07/30/17   Guadalupe Dawn, MD  metoprolol  (LOPRESSOR) 100 MG tablet TAKE 1 Tablet  BY MOUTH TWICE DAILY Patient taking differently: TAKE 1 TABLET (100 MG) BY MOUTH TWICE DAILY - AFTERNOON AND BEDTIME 12/11/16   [name], DO  Multiple Vitamin (MULTIVITAMIN WITH MINERALS) TABS tablet Take 1 tablet by mouth at bedtime.     [provider]  omeprazole (PRILOSEC) 20 MG capsule Take 1 capsule (20 mg total) by mouth daily. Patient taking differently: Take 40 mg by mouth at bedtime.  03/26/17   Rumley, Burna Cash, DO  polyvinyl alcohol (LIQUIFILM TEARS) 1.4 % ophthalmic solution Place 1 drop into both eyes every 4 (four) hours as needed for dry eyes. Patient not taking: Reported on 07/27/2017 06/19/16   [name], DO  tadalafil (CIALIS) 10 MG tablet Take 1 tablet (10 mg total) by mouth daily as needed for erectile dysfunction. 05/02/17   Lindell Noe,  Curt Bears, MD  thiamine (VITAMIN B-1) 100 MG tablet Take 1 tablet (100 mg total) by mouth daily. Patient taking differently: Take 100 mg by mouth at bedtime.  05/27/16   [name], DO    Family History Family History  Problem Relation Age of Onset  . Other Mother        Unsure of medical history.  . Diabetes Mellitus II Father     Social History Social History  Substance Use Topics  . Smoking status: Never Smoker  . Smokeless tobacco: Current User    Types: Snuff  . Alcohol use 3.6 oz/week    6 Cans of beer per week     Allergies   Lisinopril   Review of Systems Review of Systems  Musculoskeletal: Positive for gait problem.  Allergic/Immunologic: Positive for immunocompromised state.       Alcoholic  All other systems reviewed and are negative.    Physical Exam Updated Vital Signs BP (!) 148/102   Pulse 63   Temp (!) 96 F (35.6 C) (Axillary)   Resp 10   Ht 5\' 10"  (1.778 m)   Wt 103.4 kg (228 lb)   SpO2 98%   BMI 32.71 kg/m   Physical Exam  Constitutional: He is oriented to person, place, and time. He appears well-developed and  well-nourished.  HENT:  Head: Normocephalic and atraumatic.  Eyes: Pupils are equal, round, and reactive to light. Conjunctivae are normal.  Neck: Neck supple. No tracheal deviation present. No thyromegaly present.  Cardiovascular: Normal rate and regular rhythm.   No murmur heard. Pulmonary/Chest: Effort normal and breath sounds normal.  Abdominal: Soft. Bowel sounds are normal. He exhibits no distension. There is no tenderness.  Musculoskeletal: Normal range of motion. He exhibits no edema or tenderness.  Neurological: He is alert and oriented to person, place, and time. He displays normal reflexes. No cranial nerve deficit. He exhibits normal muscle tone. Coordination normal.  No asterixis motor strength 5/5 overall  Skin: Skin is warm and dry. No rash noted.  Psychiatric: He has a normal mood and affect.  Nursing note and vitals reviewed.    ED Treatments / Results  Labs (all labs ordered are listed, but only abnormal results are displayed) Labs Reviewed  CBC  COMPREHENSIVE METABOLIC PANEL  ETHANOL    EKG  EKG Interpretation  Date/Time:  Sunday August 03 2017 18:06:23 EDT Ventricular Rate:  57 PR Interval:    QRS Duration: 101 QT Interval:  480 QTC Calculation: 468 R Axis:   26 Text Interpretation:  Sinus rhythm Since last tracing rate slower Confirmed by Orlie Dakin [phone]) on 08/03/2017 6:37:58 PM      Results for orders placed or performed during the hospital encounter of 08/03/17  CBC  Result Value Ref Range   WBC 7.4 4.0 - 10.5 K/uL   RBC 3.97 (L) 4.22 - 5.81 MIL/uL   Hemoglobin 12.0 (L) 13.0 - 17.0 g/dL   HCT 35.7 (L) 39.0 - 52.0 %   MCV 89.9 78.0 - 100.0 fL   MCH 30.2 26.0 - 34.0 pg   MCHC 33.6 30.0 - 36.0 g/dL   RDW 14.5 11.5 - 15.5 %   Platelets 393 150 - 400 K/uL  Comprehensive metabolic panel  Result Value Ref Range   Sodium 142 135 - 145 mmol/L   Potassium 3.8 3.5 - 5.1 mmol/L   Chloride 106 101 - 111 mmol/L   CO2 27 22 - 32 mmol/L    Glucose, Bld 143 (H)  65 - 99 mg/dL   BUN 18 6 - 20 mg/dL   Creatinine, Ser 0.94 0.61 - 1.24 mg/dL   Calcium 9.2 8.9 - 10.3 mg/dL   Total Protein 7.1 6.5 - 8.1 g/dL   Albumin 2.9 (L) 3.5 - 5.0 g/dL   AST 32 15 - 41 U/L   ALT 24 17 - 63 U/L   Alkaline Phosphatase 125 38 - 126 U/L   Total Bilirubin 0.4 0.3 - 1.2 mg/dL   GFR calc non Af Amer >60 >60 mL/min   GFR calc Af Amer >60 >60 mL/min   Anion gap 9 5 - 15  Ethanol  Result Value Ref Range   Alcohol, Ethyl (B) 288 (H) <10 mg/dL   Ct Head Wo Contrast  Result Date: 08/03/2017 CLINICAL DATA:  Fall of fluid line.  Altered level of consciousness. EXAM: CT HEAD WITHOUT CONTRAST CT CERVICAL SPINE WITHOUT CONTRAST TECHNIQUE: Multidetector CT imaging of the head and cervical spine was performed following the standard protocol without intravenous contrast. Multiplanar CT image reconstructions of the cervical spine were also generated. COMPARISON:  07/27/2017 FINDINGS: CT HEAD FINDINGS Brain: No evidence of acute infarction, hemorrhage, hydrocephalus, extra-axial collection or mass lesion/mass effect. Premature, moderate atrophy. Especially notable cerebellar atrophy, likely related to patient's history of alcohol abuse. Vascular: Atherosclerotic calcification.  No high-density vessel. Skull: Negative for acute fracture. Chronic mild irregularity of the nasal arch. Sinuses/Orbits: No evidence of injury.  Left cataract resection. CT CERVICAL SPINE FINDINGS Alignment: No traumatic malalignment. Skull base and vertebrae: Negative for fracture Soft tissues and spinal canal: No prevertebral fluid or swelling. No visible canal hematoma. Disc levels:  Stable degenerative changes. Upper chest: No acute finding. IMPRESSION: 1. No evidence of acute intracranial or cervical spine injury. 2. Premature atrophy. Electronically Signed   By: Monte Fantasia M.D.   On: 08/03/2017 20:55   Ct Head Wo Contrast  Result Date: 07/27/2017 CLINICAL DATA:  Recent falls.  Neck  pain EXAM: CT HEAD WITHOUT CONTRAST CT CERVICAL SPINE WITHOUT CONTRAST TECHNIQUE: Multidetector CT imaging of the head and cervical spine was performed following the standard protocol without intravenous contrast. Multiplanar CT image reconstructions of the cervical spine were also generated. COMPARISON:  CT head 07/10/2017 FINDINGS: CT HEAD FINDINGS Brain: Moderate atrophy. Negative for acute infarct. Negative for hemorrhage or mass. Vascular: Negative for hyperdense vessel Skull: Negative for skull fracture Sinuses/Orbits: Negative Other: None CT CERVICAL SPINE FINDINGS Alignment: Normal alignment.  Cervical kyphosis Skull base and vertebrae: Negative for fracture Soft tissues and spinal canal: Negative for mass. Carotid artery calcification bilaterally. Disc levels: Disc degeneration and spondylosis at C4-5 causing mild spinal stenosis. Mild disc degeneration at C3-4 and C6-7. Upper chest: Negative Other: None IMPRESSION: Moderate atrophy.  No acute intracranial abnormality Cervical spine degenerative change.  Negative for fracture. Electronically Signed   By: Franchot Gallo M.D.   On: 07/27/2017 18:48   Ct Head Wo Contrast  Result Date: 07/10/2017 CLINICAL DATA:  Found down, altered mental status. History of anoxic encephalopathy, alcohol abuse, cardiac arrest. EXAM: CT HEAD WITHOUT CONTRAST TECHNIQUE: Contiguous axial images were obtained from the base of the skull through the vertex without intravenous contrast. COMPARISON:  MRI of the head August 27, 2016 FINDINGS: BRAIN: No intraparenchymal hemorrhage, mass effect nor midline shift. Mild to moderate parenchymal brain volume loss, no hydrocephalus. Advanced vermian atrophy. No acute large vascular territory infarcts. No abnormal extra-axial fluid collections. Basal cisterns are patent. VASCULAR: Trace calcific atherosclerosis of the carotid siphons. SKULL: No  skull fracture. Small LEFT parietal scalp hematoma without subcutaneous gas or radiopaque  foreign bodies. SINUSES/ORBITS: The mastoid air-cells and included paranasal sinuses are well-aerated.The included ocular globes and orbital contents are non-suspicious. LEFT ocular lens implant. OTHER: None. IMPRESSION: 1. Small LEFT posterior scalp hematoma. No skull fracture. No acute intracranial process. 2. Stable mild-to-moderate parenchymal brain volume loss with disproportionate vermian atrophy. Electronically Signed   By: Elon Alas M.D.   On: 07/10/2017 05:05   Ct Cervical Spine Wo Contrast  Result Date: 08/03/2017 CLINICAL DATA:  Fall of fluid line.  Altered level of consciousness. EXAM: CT HEAD WITHOUT CONTRAST CT CERVICAL SPINE WITHOUT CONTRAST TECHNIQUE: Multidetector CT imaging of the head and cervical spine was performed following the standard protocol without intravenous contrast. Multiplanar CT image reconstructions of the cervical spine were also generated. COMPARISON:  07/27/2017 FINDINGS: CT HEAD FINDINGS Brain: No evidence of acute infarction, hemorrhage, hydrocephalus, extra-axial collection or mass lesion/mass effect. Premature, moderate atrophy. Especially notable cerebellar atrophy, likely related to patient's history of alcohol abuse. Vascular: Atherosclerotic calcification.  No high-density vessel. Skull: Negative for acute fracture. Chronic mild irregularity of the nasal arch. Sinuses/Orbits: No evidence of injury.  Left cataract resection. CT CERVICAL SPINE FINDINGS Alignment: No traumatic malalignment. Skull base and vertebrae: Negative for fracture Soft tissues and spinal canal: No prevertebral fluid or swelling. No visible canal hematoma. Disc levels:  Stable degenerative changes. Upper chest: No acute finding. IMPRESSION: 1. No evidence of acute intracranial or cervical spine injury. 2. Premature atrophy. Electronically Signed   By: Monte Fantasia M.D.   On: 08/03/2017 20:55   Ct Cervical Spine Wo Contrast  Result Date: 07/27/2017 CLINICAL DATA:  Recent falls.   Neck pain EXAM: CT HEAD WITHOUT CONTRAST CT CERVICAL SPINE WITHOUT CONTRAST TECHNIQUE: Multidetector CT imaging of the head and cervical spine was performed following the standard protocol without intravenous contrast. Multiplanar CT image reconstructions of the cervical spine were also generated. COMPARISON:  CT head 07/10/2017 FINDINGS: CT HEAD FINDINGS Brain: Moderate atrophy. Negative for acute infarct. Negative for hemorrhage or mass. Vascular: Negative for hyperdense vessel Skull: Negative for skull fracture Sinuses/Orbits: Negative Other: None CT CERVICAL SPINE FINDINGS Alignment: Normal alignment.  Cervical kyphosis Skull base and vertebrae: Negative for fracture Soft tissues and spinal canal: Negative for mass. Carotid artery calcification bilaterally. Disc levels: Disc degeneration and spondylosis at C4-5 causing mild spinal stenosis. Mild disc degeneration at C3-4 and C6-7. Upper chest: Negative Other: None IMPRESSION: Moderate atrophy.  No acute intracranial abnormality Cervical spine degenerative change.  Negative for fracture. Electronically Signed   By: Franchot Gallo M.D.   On: 07/27/2017 18:48   Dg Chest Port 1 View  Result Date: 07/27/2017 CLINICAL DATA:  51 y/o  M; mild chest pain and shortness. EXAM: PORTABLE CHEST 1 VIEW COMPARISON:  10/27/2015 chest radiograph FINDINGS: Stable heart size and mediastinal contours are within normal limits. Both lungs are clear. The visualized skeletal structures are unremarkable. IMPRESSION: No active disease. Electronically Signed   By: Kristine Garbe M.D.   On: 07/27/2017 23:58   Radiology No results found.  Procedures Procedures (including critical care time)  Medications Ordered in ED Medications - No data to display   Initial Impression / Assessment and Plan / ED Course  I have reviewed the triage vital signs and the nursing notes.  Pertinent labs & imaging results that were available during my care of the patient were reviewed  by me and considered in my medical decision making (see chart for  details).     10:45 PM patient is alert no distress ambulates without difficulty Glasgow Coma Score 15 He will be given resource guide for alcohol and substance abuse and he sustained no injury as result of the fall blood pressure recheck 3 weeks Final Clinical Impressions(s) / ED Diagnoses  Diagnosis #1 acute alcohol intoxication #2 chronic alcohol abuse #3 fall #4 elevated blood pressure Final diagnoses:  None    New Prescriptions New Prescriptions   No medications on file     Orlie Dakin, MD 08/03/17 2250

## 2017-08-03 NOTE — ED Notes (Signed)
Pt and family understood dc material. NAD NOted

## 2017-08-04 ENCOUNTER — Other Ambulatory Visit: Payer: Self-pay | Admitting: Family Medicine

## 2017-08-04 ENCOUNTER — Other Ambulatory Visit: Payer: Self-pay | Admitting: Neurology

## 2017-08-04 DIAGNOSIS — I10 Essential (primary) hypertension: Secondary | ICD-10-CM

## 2017-08-04 DIAGNOSIS — E785 Hyperlipidemia, unspecified: Secondary | ICD-10-CM

## 2017-08-04 DIAGNOSIS — E1169 Type 2 diabetes mellitus with other specified complication: Secondary | ICD-10-CM

## 2017-08-06 ENCOUNTER — Inpatient Hospital Stay: Payer: Medicaid Other | Admitting: Family Medicine

## 2017-08-06 NOTE — Assessment & Plan Note (Deleted)
Patient is normotensive today we will continue current regimen of hydrochlorthiazide and olmesartan.  Discussed return precautions.

## 2017-08-18 ENCOUNTER — Other Ambulatory Visit: Payer: Self-pay | Admitting: Family Medicine

## 2017-08-18 MED ORDER — HYDROCHLOROTHIAZIDE 25 MG PO TABS: 25.0000 mg | ORAL_TABLET | Freq: Every day | ORAL | 0 refills | Status: DC

## 2017-08-18 NOTE — Addendum Note (Signed)
Addended by: Glenis Smoker on: 08/18/2017 01:58 PM   Modules accepted: Orders

## 2017-08-20 ENCOUNTER — Ambulatory Visit: Payer: Medicaid Other | Admitting: Family Medicine

## 2017-08-25 ENCOUNTER — Other Ambulatory Visit: Payer: Self-pay | Admitting: *Deleted

## 2017-08-25 MED ORDER — INSULIN GLARGINE 100 UNITS/ML SOLOSTAR PEN: 15.0000 [IU] | PEN_INJECTOR | Freq: Every day | SUBCUTANEOUS | 0 refills | Status: DC

## 2017-08-25 MED ORDER — INSULIN GLARGINE 100 UNITS/ML SOLOSTAR PEN: 15.0000 [IU] | PEN_INJECTOR | Freq: Every day | SUBCUTANEOUS | 1 refills | Status: DC

## 2017-08-25 NOTE — Telephone Encounter (Signed)
Pt calls nurse line.  Needs a refill on Lantus sent to [facility], but is completely out at this time. Wants to know if we have samples.  Spoke with Dr. Lindell Noe, we can provide one pen as a sample.  Pt informed and will make a followup appt when he comes to pick up meds.  Whitley Patchen, [place]otted, CMA

## 2017-09-01 ENCOUNTER — Ambulatory Visit: Payer: Medicaid Other | Admitting: Registered"

## 2017-09-02 ENCOUNTER — Encounter: Payer: Self-pay | Admitting: Student

## 2017-09-02 ENCOUNTER — Ambulatory Visit (INDEPENDENT_AMBULATORY_CARE_PROVIDER_SITE_OTHER): Payer: Self-pay | Admitting: Student

## 2017-09-02 ENCOUNTER — Other Ambulatory Visit: Payer: Self-pay

## 2017-09-02 VITALS — BP 118/78 | HR 66 | Temp 97.9°F | Ht 70.0 in | Wt 239.2 lb

## 2017-09-02 DIAGNOSIS — I10 Essential (primary) hypertension: Secondary | ICD-10-CM

## 2017-09-02 DIAGNOSIS — Z789 Other specified health status: Secondary | ICD-10-CM

## 2017-09-02 DIAGNOSIS — E1142 Type 2 diabetes mellitus with diabetic polyneuropathy: Secondary | ICD-10-CM

## 2017-09-02 DIAGNOSIS — R569 Unspecified convulsions: Secondary | ICD-10-CM

## 2017-09-02 DIAGNOSIS — E119 Type 2 diabetes mellitus without complications: Secondary | ICD-10-CM

## 2017-09-02 DIAGNOSIS — I9589 Other hypotension: Secondary | ICD-10-CM

## 2017-09-02 DIAGNOSIS — I951 Orthostatic hypotension: Secondary | ICD-10-CM | POA: Insufficient documentation

## 2017-09-02 LAB — POCT GLYCOSYLATED HEMOGLOBIN (HGB A1C): HEMOGLOBIN A1C: 11.4

## 2017-09-02 MED ORDER — LEVETIRACETAM 500 MG PO TABS: 500.0000 mg | ORAL_TABLET | Freq: Two times a day (BID) | ORAL | 0 refills | Status: DC

## 2017-09-02 MED ORDER — DIVALPROEX SODIUM ER 500 MG PO TB24: 500.0000 mg | ORAL_TABLET | Freq: Two times a day (BID) | ORAL | 0 refills | Status: DC

## 2017-09-02 MED ORDER — INSULIN GLARGINE 100 UNITS/ML SOLOSTAR PEN: 15.0000 [IU] | PEN_INJECTOR | Freq: Every day | SUBCUTANEOUS | 1 refills | Status: DC

## 2017-09-02 MED ORDER — INSULIN GLULISINE 100 UNIT/ML SOLOSTAR PEN: PEN_INJECTOR | SUBCUTANEOUS | 3 refills | Status: DC

## 2017-09-02 MED ORDER — INSULIN ASPART 100 UNIT/ML FLEXPEN: 2.0000 [IU] | PEN_INJECTOR | Freq: Three times a day (TID) | SUBCUTANEOUS | 11 refills | Status: DC

## 2017-09-02 NOTE — Patient Instructions (Signed)
It was great seeing you today! We have addressed the following issues today Diabetes: your A1c is 11.3 % today.  Your goal A1c is less than 8.0%. See below for more information about A1c.  1. Check your blood glucose 4 times a day (15 minutes before each main meal and about 10 pm) 2-3 days a week and write down the numbers on the book we gave you 2. Inject 15 units of Lantus before bedtime (at 10 PM) 3. Inject 2 units of NovoLog 3 times a day (15 minutes before each meal) 4. Eat regular meals (breakfast, lunch and dinner) around-the-clock. You may snack as needed. See below about few tips and recommendations on diet and exercise.  5. Watch for symptoms of low blood sugar (hypoglycemia). Read below about these symptoms and management. 6. Please come back and see [name] in one week.  7. Please bring all your medication bottles to that visit.   What is A1c:  The A1C test result reflects your average blood sugar level for the past two to three months. Specifically, the A1C test measures what percentage of your hemoglobin - a protein in red blood cells that carries oxygen - is coated with sugar (glycated). The higher your A1C level, the poorer your blood sugar control and the higher your risk of diabetes complications. Portion Size    Choose healthier foods such as 100% whole grains, vegetables, fruits, beans, nut seeds, olive oil, most vegetable oils, fat-free dietary, wild game and fish.   Avoid sweet tea, other sweetened beverages, soda, fruit juice, cold cereal and milk and trans fat.   Eat at least 3 meals and 1-2 snacks per day.  Aim for no more than 5 hours between eating.  Eat breakfast within one hour of getting up.    Exercise at least 150 minutes per week, including weight resistance exercises 3 or 4 times per week.   Try to lose at least 7-10% of your current body weight.   Hypoglycemia Hypoglycemia is when the sugar (glucose) level in the blood is too low. Symptoms of low blood  sugar may include:  Feeling: ? Hungry. ? Worried or nervous (anxious). ? Sweaty and clammy. ? Confused. ? Dizzy. ? Sleepy. ? Sick to your stomach (nauseous).  Having: ? A fast heartbeat. ? A headache. ? A change in your vision. ? Jerky movements that you cannot control (seizure). ? Nightmares. ? Tingling or no feeling (numbness) around the mouth, lips, or tongue.  Having trouble with: ? Talking. ? Paying attention (concentrating). ? Moving (coordination). ? Sleeping.  Shaking.  Passing out (fainting).  Getting upset easily (irritability).  Low blood sugar can happen to people who have diabetes and people who do not have diabetes. Low blood sugar can happen quickly, and it can be an emergency. Treating Low Blood Sugar Low blood sugar is often treated by eating or drinking something sugary right away. If you can think clearly and swallow safely, follow the 15:15 rule:  Take 15 grams of a fast-acting carb (carbohydrate). Some fast-acting carbs are: ? 1 tube of glucose gel. ? 3 sugar tablets (glucose pills). ? 6-8 pieces of hard candy. ? 4 oz (120 mL) of fruit juice. ? 4 oz (120 mL) of regular (not diet) soda.  Check your blood sugar 15 minutes after you take the carb.  If your blood sugar is still at or below 70 mg/dL (3.9 mmol/L), take 15 grams of a carb again.  If your blood sugar does not go  above 70 mg/dL (3.9 mmol/L) after 3 tries, get help right away.  After your blood sugar goes back to normal, eat a meal or a snack within 1 hour.  Treating Very Low Blood Sugar If your blood sugar is at or below 54 mg/dL (3 mmol/L), you have very low blood sugar (severe hypoglycemia). This is an emergency. Do not wait to see if the symptoms will go away. Get medical help right away. Call your local emergency services (911 in the [place] Do not drive yourself to the hospital. If you have very low blood sugar and you cannot eat or drink, you may need a glucagon shot (injection). A  family member or friend should learn how to check your blood sugar and how to give you a glucagon shot. Ask your doctor if you need to have a glucagon shot kit at home. Follow these instructions at home: General instructions  Avoid any diets that cause you to not eat enough food. Talk with your doctor before you start any new diet.  Take over-the-counter and prescription medicines only as told by your doctor.  Limit alcohol to no more than 1 drink per day for nonpregnant women and 2 drinks per day for men. One drink equals 12 oz of beer, 5 oz of wine, or 1 oz of hard liquor.  Keep all follow-up visits as told by your doctor. This is important. If You Have Diabetes:   Make sure you know the symptoms of low blood sugar.  Always keep a source of sugar with you, such as: ? Sugar. ? Sugar tablets. ? Glucose gel. ? Fruit juice. ? Regular soda (not diet soda). ? Milk. ? Hard candy. ? Honey.  Take your medicines as told.  Follow your exercise and meal plan. ? Eat on time. Do not skip meals. ? Follow your sick day plan when you cannot eat or drink normally. Make this plan ahead of time with your doctor.  Check your blood sugar as often as told by your doctor. Always check before and after exercise.  Share your diabetes care plan with: ? Your work or school. ? People you live with.  Check your pee (urine) for ketones: ? When you are sick. ? As told by your doctor.  Carry a card or wear jewelry that says you have diabetes. If You Have Low Blood Sugar From Other Causes:   Check your blood sugar as often as told by your doctor.  Follow instructions from your doctor about what you cannot eat or drink. Contact a doctor if:  You have trouble keeping your blood sugar in your target range.  You have low blood sugar often. Get help right away if:  You still have symptoms after you eat or drink something sugary.  Your blood sugar is at or below 54 mg/dL (3 mmol/L).  You have  jerky movements that you cannot control.  You pass out. These symptoms may be an emergency. Do not wait to see if the symptoms will go away. Get medical help right away. Call your local emergency services (911 in the [place] Do not drive yourself to the hospital. This information is not intended to replace advice given to you by your health care provider. Make sure you discuss any questions you have with your health care provider. Document Released: 12/18/2009 Document Revised: 02/29/2016 Document Reviewed: 10/27/2015 Elsevier Interactive Patient Education  [employer].

## 2017-09-02 NOTE — Progress Notes (Signed)
Subjective:    Jeffrey [name] is a 51 y.o. old male here for hospital follow-up on syncope.  Patient is poor historian.   HPI  Patient was hospitalized 10/21-10/24 for multiple fall at home thought to be due to orthostatic hypotension.  His orthostatic vitals were positive during that hospitalization.  He also had a hypotensive episode during that hospitalization.  He has history of seizure.  Neurology added Keppra 500 mg twice daily to his Depakote although they did not think his fall was related to his seizure.  However, patient has not been taking those medications.  He was also on multiple antihypertensive medication.  He was discharged home with the plan to adjust his antihypertensive as an outpatient.   Today, he reports 2 episodes of fall about 2 weeks ago.  Fell two weeks ago at [employer] as he walked in. He startes shaking and his legs giving out.  Denies hitting his head or LOC. People helped him get up.  He says he was involved you drowsy.  He had seizure for two years.  He hasn't gotten Keppra since he left the hospital. He sent the paper prescription in mail to his pharmacy and he hasn't received any of them yet. He brought his old and empty medication bottles to this visit.  Most of this bottles are from 4 months 3 months ago.  He says he did not leave any medication bottles at home except his insulins.  Wife agrees with this.  He also reports [facility] getting [facility] medication [facility].  I do not know why he has to go to [facility] while he has Medicaid as a primary coverage  Diabetes: He reports using 13 units of Lantus at night and 2 units Apidura twice a daily.  He reports checking his fasting blood glucose in the morning.  He says his meter simply read "high" and doesn't show number.  He denies polydipsia, polyuria, fever, chills, nausea, vomiting or abdominal pain.  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; HYPERTENSION, BENIGN SYSTEMIC; ARTHRITIS; ALCOHOL ABUSE, HX OF; Venous insufficiency; GI bleed; Cardiac arrest ([place]); Obesity; Anoxic brain injury ([place]); Pulmonary edema; OSA on CPAP; Dehydration; Passed out [facility]); Hyperglycemia; Syncope and collapse; HLD (hyperlipidemia); Erosive esophagitis; Alcohol abuse; AKI (acute kidney injury) ([place]); Alcohol intoxication ([place]); Dysarthria; Aphasia; Elevated liver enzymes; Ataxia; Hepatic cirrhosis ([place]); Diabetic polyneuropathy associated with type 2 diabetes mellitus ([place]); Altered mental status; Essential hypertension; Proteinuria; Loss of teeth; Depression; Hip pain; Hyponatremia; Seizure ([place]); DKA (diabetic ketoacidoses) ([place]); Speech disturbance; Hypotension; Sepsis ([place]); and Orthostatic syncope on their problem list.   has a past medical history of Acute renal insufficiency (12/08/2012), AKI (acute kidney injury) ([place]), ALCOHOL ABUSE, HX OF (11/10/2007), Anoxic encephalopathy ([place]), Anxiety, Bleeding ulcer (2014), Blood transfusion (2014), Cardiac arrest ([place]) (12/07/2012), GERD (gastroesophageal reflux disease), High cholesterol, Hypertension, OSA on CPAP (12/07/2012), Seizure-like activity ([place]), Type II diabetes mellitus ([place]), and Venous insufficiency (12/13/2011).  FH:  Family History  Problem Relation Age of Onset  . Other Mother        Unsure of medical history.  . Diabetes Mellitus II Father     SH Social History   Tobacco Use  . Smoking status: Never Smoker  . Smokeless tobacco: Current User    Types: Snuff  Substance Use Topics  . Alcohol use: Yes    Alcohol/week: 3.6 oz  Types: 6 Cans of beer per week  . Drug use: No    Review of Systems Review of systems negative except for pertinent positives and negatives in history of present illness above.     Objective:     Vitals:   09/02/17 0850  BP: 118/78  Pulse: 66  Temp: 97.9 F (36.6  C)  TempSrc: Oral  SpO2: 99%  Weight: 239 lb 3.2 oz (108.5 kg)  Height: '5\' 10"'$  (1.778 m)   Body mass index is 34.32 kg/m.  Physical Exam GEN: appears well, no ditress EYES: PERRL, EOMI THROAT: MMM NECK: Supple RESP:  No IWOB, CTAB CVS:  RRR, normal S1&S2, no murmurs GI: soft, NT with active BS MSK: No focal tenderness NEURO: Grossly intact PSYCH: normal affect    Assessment and Plan:  1. Orthostatic syncope: patient had positive orthostatic vitals at his recent hospitalization.  Reports 2 episodes of falls about 2 weeks ago which appears to be mechanical based on his description with his legs giving out.  Regardless, I do not think you need to be on antihypertensive medication if his blood pressure remains this way.  It does not seem he has been taking any antihypertensive medication recently.  All his empty bottles he brought to this visit are from 4 months ago.  Both patient and patient's wife state that there are no other medication bottles left at home.  Will follow-up in 1 week to see if his blood pressure remains stable without medication.  - CMP14+EGFR  2. HYPERTENSION, BENIGN SYSTEMIC: Normotensive today.  Does not seem that he is taking any antihypertensive medication. -Will reassess again next week -CMP as above   3. Type 2 diabetes mellitus with hemoglobin A1c goal of less than 7.5% ([place]): Poorly controlled but improved.  A1c 11.3%.  Check his fasting blood glucose but does not know the numbers.  We will continue Lantus 15 units at bedtime.  We will continue Apidra 2 units 3 times a day before meals.  I encouraged both patient and his wife to keep blood glucose log and bring to the next visit next week.  Gave him sample for Lantus and NovoLog.  Advised him not to use NovoLog and Apidra together  4. Seizure-like activity ([place]): Does not seem to be taking his Depakote and Keppra.  He says he has received both prescriptions after he was released from the hospital. -Gave him  paper prescription for both  5.  Medically complex patient: this patient has complex medical conditions.  I am really worried about polypharmacy.  He does not seem to understand about his medications.  He brought old and empty bottles from 4 months ago.  I think he would benefit from home health nurse if insurance is not limiting.  I do not know why he has to go to [facility] while have Medicaid. Will order [facility] for help with his medications.   Return in about 1 week (around 09/09/2017) for DM and HTN.  Mercy Riding, MD 09/02/17 Pager: [phone]

## 2017-09-03 LAB — CMP14+EGFR
A/G RATIO: 1 — AB (ref 1.2–2.2)
ALT: 13 IU/L (ref 0–44)
AST: 23 IU/L (ref 0–40)
Albumin: 3.4 g/dL — ABNORMAL LOW (ref 3.5–5.5)
Alkaline Phosphatase: 148 IU/L — ABNORMAL HIGH (ref 39–117)
BUN/Creatinine Ratio: 9 (ref 9–20)
BUN: 12 mg/dL (ref 6–24)
Bilirubin Total: 0.2 mg/dL (ref 0.0–1.2)
CALCIUM: 8.9 mg/dL (ref 8.7–10.2)
CO2: 21 mmol/L (ref 20–29)
Chloride: 93 mmol/L — ABNORMAL LOW (ref 96–106)
Creatinine, Ser: 1.3 mg/dL — ABNORMAL HIGH (ref 0.76–1.27)
GFR, EST AFRICAN AMERICAN: 72 mL/min/{1.73_m2} (ref >59)
GFR, EST NON AFRICAN AMERICAN: 62 mL/min/{1.73_m2} (ref >59)
GLOBULIN, TOTAL: 3.3 g/dL (ref 1.5–4.5)
Glucose: 174 mg/dL — ABNORMAL HIGH (ref 65–99)
POTASSIUM: 4.6 mmol/L (ref 3.5–5.2)
SODIUM: 132 mmol/L — AB (ref 134–144)
TOTAL PROTEIN: 6.7 g/dL (ref 6.0–8.5)

## 2017-09-08 ENCOUNTER — Ambulatory Visit: Payer: Medicaid Other | Admitting: Student

## 2017-09-08 ENCOUNTER — Other Ambulatory Visit: Payer: Self-pay | Admitting: Family Medicine

## 2017-09-08 DIAGNOSIS — E785 Hyperlipidemia, unspecified: Secondary | ICD-10-CM

## 2017-09-08 DIAGNOSIS — E1169 Type 2 diabetes mellitus with other specified complication: Secondary | ICD-10-CM

## 2017-09-08 DIAGNOSIS — I10 Essential (primary) hypertension: Secondary | ICD-10-CM

## 2017-09-11 ENCOUNTER — Ambulatory Visit: Payer: Medicaid Other | Admitting: Student

## 2017-09-15 ENCOUNTER — Ambulatory Visit: Payer: Medicaid Other | Admitting: Student

## 2017-09-22 ENCOUNTER — Encounter: Payer: Self-pay | Admitting: Student

## 2017-09-22 ENCOUNTER — Other Ambulatory Visit: Payer: Self-pay | Admitting: Family Medicine

## 2017-09-22 ENCOUNTER — Other Ambulatory Visit: Payer: Self-pay

## 2017-09-22 ENCOUNTER — Ambulatory Visit (INDEPENDENT_AMBULATORY_CARE_PROVIDER_SITE_OTHER): Payer: Self-pay | Admitting: Student

## 2017-09-22 ENCOUNTER — Encounter: Payer: Self-pay | Admitting: Psychology

## 2017-09-22 VITALS — BP 138/94 | HR 89 | Temp 97.7°F | Ht 70.0 in | Wt 243.8 lb

## 2017-09-22 DIAGNOSIS — E119 Type 2 diabetes mellitus without complications: Secondary | ICD-10-CM

## 2017-09-22 DIAGNOSIS — I10 Essential (primary) hypertension: Secondary | ICD-10-CM

## 2017-09-22 DIAGNOSIS — Z789 Other specified health status: Secondary | ICD-10-CM

## 2017-09-22 MED ORDER — NAPROXEN 500 MG PO TABS: 500.0000 mg | ORAL_TABLET | Freq: Two times a day (BID) | ORAL | 0 refills | Status: DC

## 2017-09-22 MED ORDER — METFORMIN HCL 500 MG PO TABS: 500.0000 mg | ORAL_TABLET | Freq: Two times a day (BID) | ORAL | 3 refills | Status: DC

## 2017-09-22 NOTE — Progress Notes (Signed)
Subjective:    Jeffrey [name] is a 51 y.o. old male here for follow up on his diabetes and medication review.  Patient is here with his wife.  HPI Diabetes: He brought his meter to this visit.  Blood sugar ranges 39-400's. Some are fasting and others are random. He says his fasting blood sugar was 55 this morning about 10 am. He drunk some orange juice and felt better. He reports skipping meals and his short acting insulin. He reports skipping meals due to lack of appetite. Reports injecting 15 units of Lantus in the morning. He also injects 5 units of Apidura whenever he eats big meals.  He reports eating most of his meals at night. Occasionally diet soda. Drinks a lot of whole milk. Quit alcohol 4 weeks ago. Reports walking daily for about 20 minutes.  Hasn't been to an eye doctor recently. He hasn't been to an eye doctor for long time. 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 He has no insurance either  Medication review: Patient was seen in clinic about 3 weeks ago.  At that time he brought empty bottles from about a year ago. I advised him to bring the new bottles with medication when her returns.  Unfortunately, he says he put his medications in the back and left on the bed before he came to this visit.   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; HYPERTENSION, BENIGN SYSTEMIC; ARTHRITIS; ALCOHOL ABUSE, HX OF; Venous insufficiency; GI bleed; Cardiac arrest ([place]); Obesity; Anoxic brain injury ([place]); Pulmonary edema; OSA on CPAP; Dehydration; Passed out [facility]); Hyperglycemia; Syncope and collapse; HLD (hyperlipidemia); Erosive esophagitis; Alcohol abuse; AKI (acute kidney injury) ([place]); Alcohol intoxication ([place]); Dysarthria; Aphasia; Elevated liver enzymes; Ataxia; Hepatic cirrhosis ([place]); Diabetic  polyneuropathy associated with type 2 diabetes mellitus ([place]); Altered mental status; Essential hypertension; Proteinuria; Loss of teeth; Depression; Hip pain; Hyponatremia; Seizure ([place]); DKA (diabetic ketoacidoses) ([place]); Speech disturbance; Hypotension; Sepsis ([place]); and Orthostatic syncope on their problem list.   has a past medical history of Acute renal insufficiency (12/08/2012), AKI (acute kidney injury) ([place]), ALCOHOL ABUSE, HX OF (11/10/2007), Anoxic encephalopathy ([place]), Anxiety, Bleeding ulcer (2014), Blood transfusion (2014), Cardiac arrest ([place]) (12/07/2012), GERD (gastroesophageal reflux disease), High cholesterol, Hypertension, OSA on CPAP (12/07/2012), Seizure-like activity ([place]), Type II diabetes mellitus ([place]), and Venous insufficiency (12/13/2011).  FH:  Family History  Problem Relation Age of Onset  . Other Mother        Unsure of medical history.  . Diabetes Mellitus II Father     SH Social History   Tobacco Use  . Smoking status: Never Smoker  . Smokeless tobacco: Current User    Types: Snuff  Substance Use Topics  . Alcohol use: Yes    Alcohol/week: 3.6 oz    Types: 6 Cans of beer per week  . Drug use: No    Review of Systems Review of systems negative except for pertinent positives and negatives in history of present illness above.     Objective:     Vitals:   09/22/17 1037  BP: (!) 138/94  Pulse: 89  Temp: 97.7 F (36.5 C)  TempSrc: Oral  SpO2: 100%  Weight: 243 lb 12.8 oz (110.6 kg)  Height: 5\' 10"  (1.778 m)   Body mass index is 34.98 kg/m.  Physical Exam  GEN: appears disheveled,  flat Head: normocephalic and atraumatic  CVS: RRR, nl s1 & s2, no murmurs RESP: no IWOB GI: BS present & normal, soft, NTND MSK: no focal tenderness or notable swelling SKIN: dry skin PSYCH: flat affect .phq    Assessment and Plan:  1. Type 2 diabetes mellitus with hemoglobin A1c goal of less than 8.0% ([place]): Last A1c 11.4%.  Poorly controlled likely due to many  factors: poor compliance with his medication, low literacy, lack of understanding about his diet, poor meal schedule and lack of motivation. He also reports skipping his insulin.  His blood glucose ranges from upper 30s-400s. His PHQ-2 has been 0. May be overwhelmed with his health condition. Offered his referral to health coach that he accepts. Warm hand off to [facility] for motivational interview, assessment of possible depression and cognitive status. He is also interested in referral to nutritionist. Gave him the phone number and advised him to call and make the schedule with out nutritionist. Besides encouraged him to do the followings: 1. Check your blood glucose 4 times a day (15 minutes before each main meal and about 10 pm) 2-3 days a week and write down 2. Inject 15 units of Lantus in the morning 3. Inject 5 units of Apidura 3 times a day (15 minutes before each meal) 4. We have started your metformin 500 mg twice a day. 5. Eat regular meals (breakfast, lunch and dinner) around-the-clock. You may snack as needed. See below about few tips and recommendations on diet and exercise. Gave him handouts on portion size, good food choice and exercise. 6. Watch for symptoms of low blood sugar (hypoglycemia). Read below about these symptoms and management. 7. Please follow up with your primary care doctor in one month.  8. Please bring all your medication bottles to that visit.  - Ambulatory referral to Ophthalmology  - Microalbumin/Creatinine Ratio, Urine  2. Medication review: he says he forgot to bring his medication to this visit. He doesn't remember what he takes except his insulins. I don't know if this is lack of motivation or cognitive issue. [place] to help as with MI, assessment for possible depression and cognitive assessment. Wife was with him at his last visit when I asked him to bring his medication to this visits. I emphasized the importance of this again today.   3. Essential hypertension -  Basic metabolic panel  Return in about 1 month (around 10/23/2017) for DM with PCP.  Mercy Riding, MD 09/22/17 Pager: [phone]

## 2017-09-22 NOTE — Assessment & Plan Note (Signed)
°  Assessment / Plan / Recommendations: Jeffrey [name] denies feeling depressed, but states that he is angry and irritated much of the time. His PHQ9 of 15 indicates moderately severe depression. We also discussed strategies for managing his diabetes. He rated this 10/10 on importance. He selected food as an area of focus. Jeffrey [name] likes green vegetables but does not eat them more than once or twice per week. He set a goal of eating a green vegetable every day, but did express concern regarding cost and feasibility. I encouraged him to buy the frozen vegetables that he enjoys (green beans, broccoli, brussell sprouts) to keep costs down and prevent spoilage. He considers this goal a 6/10 on importance (because it's a small piece of the big picture) and he has 10/10 confidence that he can eat a green veggie each day. I encouraged him to start with small, achievable goals. He will return on January 7 to discuss his progress toward this goal and also to discuss strategies for managing his depression. Jeffrey [name] was not aware of his last A1c result and asked for more information regarding counting carbs, so I suggested that he see Dr. Jenne Campus, our nutritionist and I also told Dr. Cyndia Skeeters about this.

## 2017-09-22 NOTE — Patient Instructions (Addendum)
It was great seeing you today! We have addressed the following issues today Diabetes: Last A1c is 11.4%.  Your goal A1c is less than 8.0%.  See below for more information about A1c. 1. Check your blood glucose 4 times a day (15 minutes before each main meal and about 10 pm) 2-3 days a week and write down the numbers 2. Inject 15 units of Lantus in then morning 3. Inject 5 units of Apidura 3 times a day (15 minutes before each meal) 4. We have started your metformin 500 mg twice a day. 5. Eat regular meals (breakfast, lunch and dinner) around-the-clock. You may snack as needed. See below about few tips and recommendations on diet and exercise.  6. Watch for symptoms of low blood sugar (hypoglycemia). Read below about these symptoms and management. 7. Please follow up with your primary care doctor in one month.  8. Please bring all your medication bottles to that visit.   What is A1c:  The A1C test result reflects your average blood sugar level for the past two to three months. Specifically, the A1C test measures what percentage of your hemoglobin - a protein in red blood cells that carries oxygen - is coated with sugar (glycated). The higher your A1C level, the poorer your blood sugar control and the higher your risk of diabetes complications. Portion Size    Choose healthier foods such as 100% whole grains, vegetables, fruits, beans, nut seeds, olive oil, most vegetable oils, fat-free dietary, wild game and fish.   Avoid sweet tea, other sweetened beverages, soda, fruit juice, cold cereal and milk and trans fat.   Eat at least 3 meals and 1-2 snacks per day.  Aim for no more than 5 hours between eating.  Eat breakfast within one hour of getting up.    Exercise at least 150 minutes per week, including weight resistance exercises 3 or 4 times per week.   Try to lose at least 7-10% of your current body weight.   Hypoglycemia Hypoglycemia is when the sugar (glucose) level in the blood  is too low. Symptoms of low blood sugar may include:  Feeling: ? Hungry. ? Worried or nervous (anxious). ? Sweaty and clammy. ? Confused. ? Dizzy. ? Sleepy. ? Sick to your stomach (nauseous).  Having: ? A fast heartbeat. ? A headache. ? A change in your vision. ? Jerky movements that you cannot control (seizure). ? Nightmares. ? Tingling or no feeling (numbness) around the mouth, lips, or tongue.  Having trouble with: ? Talking. ? Paying attention (concentrating). ? Moving (coordination). ? Sleeping.  Shaking.  Passing out (fainting).  Getting upset easily (irritability).  Low blood sugar can happen to people who have diabetes and people who do not have diabetes. Low blood sugar can happen quickly, and it can be an emergency. Treating Low Blood Sugar Low blood sugar is often treated by eating or drinking something sugary right away. If you can think clearly and swallow safely, follow the 15:15 rule:  Take 15 grams of a fast-acting carb (carbohydrate). Some fast-acting carbs are: ? 1 tube of glucose gel. ? 3 sugar tablets (glucose pills). ? 6-8 pieces of hard candy. ? 4 oz (120 mL) of fruit juice. ? 4 oz (120 mL) of regular (not diet) soda.  Check your blood sugar 15 minutes after you take the carb.  If your blood sugar is still at or below 70 mg/dL (3.9 mmol/L), take 15 grams of a carb again.  If your blood sugar  does not go above 70 mg/dL (3.9 mmol/L) after 3 tries, get help right away.  After your blood sugar goes back to normal, eat a meal or a snack within 1 hour.  Treating Very Low Blood Sugar If your blood sugar is at or below 54 mg/dL (3 mmol/L), you have very low blood sugar (severe hypoglycemia). This is an emergency. Do not wait to see if the symptoms will go away. Get medical help right away. Call your local emergency services (911 in the [place] Do not drive yourself to the hospital. If you have very low blood sugar and you cannot eat or drink, you may  need a glucagon shot (injection). A family member or friend should learn how to check your blood sugar and how to give you a glucagon shot. Ask your doctor if you need to have a glucagon shot kit at home. Follow these instructions at home: General instructions  Avoid any diets that cause you to not eat enough food. Talk with your doctor before you start any new diet.  Take over-the-counter and prescription medicines only as told by your doctor.  Limit alcohol to no more than 1 drink per day for nonpregnant women and 2 drinks per day for men. One drink equals 12 oz of beer, 5 oz of wine, or 1 oz of hard liquor.  Keep all follow-up visits as told by your doctor. This is important. If You Have Diabetes:   Make sure you know the symptoms of low blood sugar.  Always keep a source of sugar with you, such as: ? Sugar. ? Sugar tablets. ? Glucose gel. ? Fruit juice. ? Regular soda (not diet soda). ? Milk. ? Hard candy. ? Honey.  Take your medicines as told.  Follow your exercise and meal plan. ? Eat on time. Do not skip meals. ? Follow your sick day plan when you cannot eat or drink normally. Make this plan ahead of time with your doctor.  Check your blood sugar as often as told by your doctor. Always check before and after exercise.  Share your diabetes care plan with: ? Your work or school. ? People you live with.  Check your pee (urine) for ketones: ? When you are sick. ? As told by your doctor.  Carry a card or wear jewelry that says you have diabetes. If You Have Low Blood Sugar From Other Causes:   Check your blood sugar as often as told by your doctor.  Follow instructions from your doctor about what you cannot eat or drink. Contact a doctor if:  You have trouble keeping your blood sugar in your target range.  You have low blood sugar often. Get help right away if:  You still have symptoms after you eat or drink something sugary.  Your blood sugar is at or  below 54 mg/dL (3 mmol/L).  You have jerky movements that you cannot control.  You pass out. These symptoms may be an emergency. Do not wait to see if the symptoms will go away. Get medical help right away. Call your local emergency services (911 in the [place] Do not drive yourself to the hospital. This information is not intended to replace advice given to you by your health care provider. Make sure you discuss any questions you have with your health care provider. Document Released: 12/18/2009 Document Revised: 02/29/2016 Document Reviewed: 10/27/2015 Elsevier Interactive Patient Education  [employer].

## 2017-09-22 NOTE — Progress Notes (Unsigned)
Dr. Cyndia Skeeters requested a [place] to discuss diabetes control, possible depression, and to assess cognitive functioning.  Presenting Issue:  Poorly controlled diabetes, possible depression; rule out cognitive issues.  Report of symptoms:  Elevated HbA1c, history of poorly controlled diabetes (last HbA1c=11.3), possible depression.  Duration of CURRENT symptoms:  Several years  PHQ-9:  15 (moderately severe, no SI) DDS: (1) 4 (2) 3 (3) 4 (4) 3 (5) 4 (6) 5 (7) 4 (8) 5 (9) 2 (10) 5 (11) 4 (12) 5 (13) 5 (14) 5 (15) 3 (16) 5 (17) 4  Total DDS=4.12 (positive screen for clinically significant distress related to his diabetes)  Emotional Burden = 4.6 (positive) Care-related = 2.75 (negative) Regimen-related = 4.8 (positive) Interpersonal-related = 4.3 (positive) Thus, Mr. Wieneke is feeling very burdened by the demands of diabetes, does not feel confident about his ability to manage it and does not feel that friends and family are supportive of his efforts. He did not express significant concerns related to the medical care that he has received for his diabetes.  Mr. Feasel also completed the Diabetes questionnaire given to him by Dr. Cyndia Skeeters. Responses are below: (1) often (2 )very important (3) 7.5 (4) none (5) yes (6) yes (7) no (8 )no (9 )no (10) self Serious problem (5), serious problem (5)  At the request of Dr. Cyndia Skeeters, I gave Mr. Bolander the Mini-mental Status Exam. He was able to answer almost all questions except that he could not recite words back to me initially or upon a second request. He also told me that it is winter (understandable, given the snow). His score is 23/30, which is abnormal for someone with a college degree; however, this is not necessarily indicative of cognitive impairment and should be reassessed at a later date.   Assessment / Plan / Recommendations: Mr. Rainville denies feeling depressed, but states that he is angry and irritated  much of the time. His PHQ9 of 15 indicates moderately severe depression. We also discussed strategies for managing his diabetes. He rated this 10/10 on importance. He selected food as an area of focus. Mr. [name] likes green vegetables but does not eat them more than once or twice per week. He set a goal of eating a green vegetable every day, but did express concern regarding cost and feasibility. I encouraged him to buy the frozen vegetables that he enjoys (green beans, broccoli, brussell sprouts) to keep costs down and prevent spoilage. He considers this goal a 6/10 on importance (because it's a small piece of the big picture) and he has 10/10 confidence that he can eat a green veggie each day. I encouraged him to start with small, achievable goals. He will return on January 7 to discuss his progress toward this goal and also to discuss strategies for managing his depression. Mr. Teall was not aware of his last A1c result and asked for more information regarding counting carbs, so I suggested that he see Dr. Jenne Campus, our nutritionist and I also told Dr. Cyndia Skeeters about this.  Warmhandoff complete.

## 2017-09-23 LAB — BASIC METABOLIC PANEL
BUN/Creatinine Ratio: 7 — ABNORMAL LOW (ref 9–20)
BUN: 6 mg/dL (ref 6–24)
CALCIUM: 9.6 mg/dL (ref 8.7–10.2)
CO2: 25 mmol/L (ref 20–29)
CREATININE: 0.84 mg/dL (ref 0.76–1.27)
Chloride: 104 mmol/L (ref 96–106)
GFR, EST AFRICAN AMERICAN: 116 mL/min/{1.73_m2} (ref >59)
GFR, EST NON AFRICAN AMERICAN: 100 mL/min/{1.73_m2} (ref >59)
Glucose: 73 mg/dL (ref 65–99)
POTASSIUM: 4.7 mmol/L (ref 3.5–5.2)
Sodium: 144 mmol/L (ref 134–144)

## 2017-09-23 LAB — MICROALBUMIN / CREATININE URINE RATIO
Creatinine, Urine: 96.3 mg/dL
Microalb/Creat Ratio: 44.2 mg/g creat — ABNORMAL HIGH (ref 0.0–30.0)
Microalbumin, Urine: 42.6 ug/mL

## 2017-10-01 ENCOUNTER — Telehealth: Payer: Self-pay | Admitting: Family Medicine

## 2017-10-01 NOTE — Telephone Encounter (Addendum)
**  After Hours/ Emergency Line Call*  Received a call to report that Jeffrey [name] has been having multiple bowel movements particularly at night for the last few months. His wife is calling today frustrated because the house smells like feces due to his multiple BMs. She notes that he has had very loose stools to the point he is going in the bed and on the floor. She cannot quantify how many times he has defecated but it's more than 5 and less than 10 a day. He has not had any fevers, lightheaded, nausea, vomiting, hematochezia, abdominal pain. He is eating and drinking a normal amount. He admits he hasn't been taking Lactulose in a couple weeks.   Advised for him to come into the clinic to be checked. Appt made for December 28th.   Red flags discussed.  Will forward to PCP.  Carlyle Dolly, MD PGY-3, [facility]

## 2017-10-03 ENCOUNTER — Other Ambulatory Visit: Payer: Self-pay

## 2017-10-03 ENCOUNTER — Other Ambulatory Visit: Payer: Self-pay | Admitting: Family Medicine

## 2017-10-03 ENCOUNTER — Ambulatory Visit (INDEPENDENT_AMBULATORY_CARE_PROVIDER_SITE_OTHER): Payer: Self-pay | Admitting: Internal Medicine

## 2017-10-03 ENCOUNTER — Encounter: Payer: Self-pay | Admitting: Internal Medicine

## 2017-10-03 DIAGNOSIS — R569 Unspecified convulsions: Secondary | ICD-10-CM

## 2017-10-03 DIAGNOSIS — E785 Hyperlipidemia, unspecified: Principal | ICD-10-CM

## 2017-10-03 DIAGNOSIS — E1169 Type 2 diabetes mellitus with other specified complication: Secondary | ICD-10-CM

## 2017-10-03 NOTE — Progress Notes (Signed)
   Subjective:   Patient: Jeffrey [name]       Birthdate: 1966-07-27       MRN: [id number]      HPI  Jeffrey [name] is a 51 y.o. male presenting for concern for seizures.   Concern for seizures Patient believes he has started having seizures again. Occurring nightly. Wakes up with fecal incontinence which occurs when he has a seizure. Ongoing for past 3 weeks. Is followed by Dr. [name]. Recently switched from valproic acid to Depakote ER 500mg  qd, however has been out of Depakote for at least the past week. Receives meds from [facility] and says they have not delivered his refill. Patient's wife also reports patient is falling very frequently. Denies urinary incontinence. Does not have a follow-up appt with Dr. [name].   Smoking status reviewed. Patient is never smoker.   Review of Systems See HPI.     Objective:  Physical Exam  Constitutional: He is oriented to person, place, and time and well-developed, well-nourished, and in no distress.  HENT:  Head: Normocephalic and atraumatic.  Eyes: Conjunctivae and EOM are normal. Pupils are equal, round, and reactive to light. Right eye exhibits no discharge. Left eye exhibits no discharge.  Cardiovascular: Normal rate.  Pulmonary/Chest: Effort normal. No respiratory distress.  Neurological: He is alert and oriented to person, place, and time. Gait normal.  CN II-XII grossly intact  Skin: Skin is warm and dry.  Psychiatric: Affect and judgment normal.      Assessment & Plan:  Seizure [facility]) Now followed by neuro Dr. [name]. Having nightly seizures with bowel incontinence for past 3 weeks. Difficult to say if due to subtherapeutic dose of Depakote or simply med non-compliance. Says last took Depakote last week, however empty prescription bottle suggests patient may have been out for up to two months. Repeatedly stressed importance of following up regularly with Dr. [name] and taking Depakote every single day. Currently neurologically intact, so feel is  appropriate for patient to call Dr. Rhea Belton office to schedule an appt ASAP for possible dose adjustment. Also stressed importance of calling [facility] to see when he will be receiving Depakote refill. Offered to write prescription for Depakote today for patient, however cheapest one month Depakote ER refill in town is $30 per [facility], and patient said he cannot afford this. Provided patient with Dr. Rhea Belton phone number. Strict instructions for what to do if prolonged seizure occurs discussed and written.    Adin Hector, MD, MPH PGY-3 [facility] Pager [phone]

## 2017-10-03 NOTE — Patient Instructions (Addendum)
It was nice meeting you today Mr. Teasdale!  IT IS EXTREMELY IMPORTANT FOR YOU TO BE SEEN BY DR. YAN AS SOON AS POSSIBLE. CALL HER OFFICE AS SOON AS YOU LEAVE OUR OFFICE. Her phone number is [phone]. Let her office know you are having seizures daily and need to be seen by her as soon as possible.   It is also VERY important to call [facility] AS SOON AS POSSIBLE TODAY to find out when you will be receiving your Depakote prescription. Be sure to call [facility] at least 10 days in advance if not more than that to make sure you are getting your prescriptions on time. Running out of prescriptions, specifically Depakote, can be a matter of life and death.   If you begin to seizure and your seizure does not stop, call 911 immediately.   If you have any questions or concerns, please feel free to call the clinic.   Be well,  Dr. Avon Gully

## 2017-10-03 NOTE — Assessment & Plan Note (Signed)
Now followed by neuro Dr. [name]. Having nightly seizures with bowel incontinence for past 3 weeks. Difficult to say if due to subtherapeutic dose of Depakote or simply med non-compliance. Says last took Depakote last week, however empty prescription bottle suggests patient may have been out for up to two months. Repeatedly stressed importance of following up regularly with Dr. [name] and taking Depakote every single day. Currently neurologically intact, so feel is appropriate for patient to call Dr. Rhea Belton office to schedule an appt ASAP for possible dose adjustment. Also stressed importance of calling [facility] to see when he will be receiving Depakote refill. Offered to write prescription for Depakote today for patient, however cheapest one month Depakote ER refill in town is $30 per [facility], and patient said he cannot afford this. Provided patient with Dr. Rhea Belton phone number. Strict instructions for what to do if prolonged seizure occurs discussed and written.

## 2017-10-13 ENCOUNTER — Ambulatory Visit (INDEPENDENT_AMBULATORY_CARE_PROVIDER_SITE_OTHER): Payer: Self-pay | Admitting: Psychology

## 2017-10-13 DIAGNOSIS — E119 Type 2 diabetes mellitus without complications: Secondary | ICD-10-CM

## 2017-10-13 NOTE — Progress Notes (Signed)
Reason for follow-up:  Quindon returns to discuss his diabetes and symptoms of depression.  Issues discussed:  Holden's depression symptoms have improved (PHQ-9 = 10, moderate depression, no SI, somewhat difficult) and he has only mild anxiety symptoms (GAD-7 = 3, not difficult at all). He has done a good job of eating vegetables. Bensen has been suffering from seizures, which make it difficult for him to go anywhere alone. 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 He reports that he was without oxygen for several minutes. It is difficult to have a conversation with him due to his tendency to discuss tangential topics. I asked him to repeat three non-related words (tree, lemonade, mouse) to me and he was able to do so; he was not able to do this previously in the Mini Mental Status Exam, so it is encouraging that he was able to do this today. I provided reflective listening, support and some motivational interviewing around his diabetes self-care strategy.

## 2017-10-13 NOTE — Assessment & Plan Note (Signed)
°  Assessment/Plan/Recommendations:  [place] has been eating a green vegetable every day and will try to continue with maintaining this goal. Danyon would next like to focus on exercise, but is concerned that his physical limitations are too great. He reports walking three times per week (about one mile) with his wife. He does not want to increase this, but was interested in other exercise options. I printed a list of free classes offered by the [place] at the [facility]. Rahm will ask Dr. Lindell Noe about this when he sees her on January 17. He is not ready to formulate an exercise goal at this point, but will return to report on his progress with regard to his diet and researching exercise programs on February 4 at 9 a.m. He will also make an appointment with Dr. Jenne Campus to discuss his nutrition questions with her.

## 2017-10-13 NOTE — Patient Instructions (Addendum)
Congratulations on achieving your goal of eating a green vegetable each day. Continue to maintain this goal! Also plan to speak with Dr. Lindell Noe (10/23/17) about starting an appropriate exercise program. She may have some good suggestions regarding programs that are appropriate for you. Plan to return to see me on February 4 at 9 a.m.  Also, make an appointment with Dr. Boyce Medici on your way out so that you can discuss your dietary questions with her more in-depth.

## 2017-10-15 ENCOUNTER — Ambulatory Visit: Payer: Self-pay

## 2017-10-22 ENCOUNTER — Ambulatory Visit: Payer: Self-pay

## 2017-10-23 ENCOUNTER — Ambulatory Visit (INDEPENDENT_AMBULATORY_CARE_PROVIDER_SITE_OTHER): Payer: Self-pay | Admitting: Family Medicine

## 2017-10-23 ENCOUNTER — Encounter: Payer: Self-pay | Admitting: Family Medicine

## 2017-10-23 ENCOUNTER — Telehealth: Payer: Self-pay | Admitting: Pharmacist

## 2017-10-23 ENCOUNTER — Other Ambulatory Visit: Payer: Self-pay

## 2017-10-23 VITALS — BP 136/82 | HR 88 | Temp 97.6°F | Ht 70.0 in | Wt 241.8 lb

## 2017-10-23 DIAGNOSIS — E1169 Type 2 diabetes mellitus with other specified complication: Secondary | ICD-10-CM

## 2017-10-23 DIAGNOSIS — Z1211 Encounter for screening for malignant neoplasm of colon: Secondary | ICD-10-CM

## 2017-10-23 DIAGNOSIS — E119 Type 2 diabetes mellitus without complications: Secondary | ICD-10-CM

## 2017-10-23 DIAGNOSIS — E785 Hyperlipidemia, unspecified: Principal | ICD-10-CM

## 2017-10-23 NOTE — Progress Notes (Signed)
   CC: med refills   HPI DM: 15 U Lantus in morning, then 5 with breakfast then 4-5 U apidra at dinner, eats minimal lunch. Few lows in dec to 60s prior to breakfast, has since changed his meal timing.   Med rec - has been out of medications for quite some time due to misunderstanding with [facility] and paperwork.   ROS: Denies CP, SOB, abd pain, dysuria, rash.    CC, SH/smoking status, and VS noted  Objective: BP 136/82   Pulse 88   Temp 97.6 F (36.4 C) (Oral)   Ht 5\' 10"  (1.778 m)   Wt 241 lb 12.8 oz (109.7 kg)   SpO2 99%   BMI 34.69 kg/m  Gen: NAD, alert, cooperative, and pleasant. HEENT: NCAT, EOMI, PERRL CV: RRR, no murmur Resp: CTAB, no wheezes, non-labored Ext: No edema, warm Neuro: Alert and oriented, Speech clear, No gross deficits  Assessment and plan:  Med rec: appreciate the help of pharmacy team in clinic, called [facility] and reconciled paperwork and refills.   DM: continue current regimen, patient with good understanding of plan and able to supply dose information (has not been the case in the past). Recheck a1c in 1 month.   Orders Placed This Encounter  Procedures  . Ambulatory referral to Gastroenterology    Referral Priority:   Routine    Referral Type:   Consultation    Referral Reason:   Specialty Services Required    Number of Visits Requested:   1    No orders of the defined types were placed in this encounter.  Health Maintenance reviewed - colonoscopy referral placed.  Ralene Ok, MD, PGY2 10/23/2017 4:36 PM

## 2017-10-23 NOTE — Patient Instructions (Signed)
It was a pleasure to see you today! Thank you for choosing [facility] for your primary care. Jeffrey [name] was seen for med refills, diabetes.   Our plans for today were:  Keep insulin the same.   See me back in February  You should return to our clinic to see Dr. Lindell Noe in 1 month (~12/03/17) for diabetes check and A1c.   Best,  Dr. Lindell Noe

## 2017-10-23 NOTE — Telephone Encounter (Signed)
Saw patient in clinic while he was completing visit with Dr. Lindell Noe. Patient expresses confusion over which medications he is supposed to be on. Obtains several medications from [facility], all other medications come from [place].   Reconciled medication lists with pill bottles in hand. Patient needs refills on the following:  From [facility]: Apidra (verified with [facility] that this refill is in process)  From [facility]: atorvastatin, depakote, folic acid, naproxen, HCTZ, levetiracetam, metformin and viagra.   Called [facility] to place refill orders. Unfortunately, there were errors in the patient's renewal paperwork and he has not returned their phone calls. They require two forms and copies of his step-children's incomes.   Patient was able to complete paperwork and obtain copies of the income statements today. Paperwork was faxed to [facility], ATTN: Ena Dawley to fax number [phone].   Will follow up and request refills of the above when the paperwork is approved.   Patient was also provided with Glucerna samples and some non-perishable food items today from in-clinic food pantry.   Carlean Jews, Pharm.D., BCPS PGY2 Ambulatory Care Pharmacy Resident Phone: [phone]

## 2017-10-28 NOTE — Telephone Encounter (Signed)
Called [place] [facility] to f/u on paperwork/eligibility. Per Ena Dawley (LCSW), patient enrolled through 10/06/2018.  Called pharmacy line to request refills of  atorvastatin, depakote, folic acid, naproxen, HCTZ, levetiracetam, metformin and viagra.  Per pharmacy:  1. Need new prescriptions for atorvastatin 80 mg daily, folic acid 1 mg daily 2. NO LONGER ABLE TO OBTAIN: levetiracetam or viagra  3. Refills processed for depakote, naproxen, HCTZ, gabapentin, metformin  Will route to Dr. Lindell Noe for refill authorization of atorvastatin and folic acid. Prescription hard copies must be faxed or mailed to [facility] med assist at fax number: [phone] or mailing address [address] [place] [address].   Levetiracetam 500 mg tablets are $9 for 60 tablets at [place]. Will advise Dr. Lindell Noe.   Viagra available through [employer] patient assistance foundation. Income cut off for family of 5 = less than or equal to $117,680. Will fill out application and bring into doctor's office for Dr. Lindell Noe this Thursday. Patient has f/u appointment with family medicine on 11/10/2017.   Called patient to relay this information. No answer, left HIPAA-compliant VM requesting he return my call.  Carlean Jews, Pharm.D., BCPS PGY2 Ambulatory Care Pharmacy Resident Phone: [phone]

## 2017-10-30 MED ORDER — FOLIC ACID 1 MG PO TABS: 1.0000 mg | ORAL_TABLET | Freq: Every day | ORAL | 12 refills | Status: DC

## 2017-10-30 MED ORDER — ATORVASTATIN CALCIUM 80 MG PO TABS: 80.0000 mg | ORAL_TABLET | Freq: Every day | ORAL | 3 refills | Status: DC

## 2017-10-30 MED ORDER — LEVETIRACETAM 500 MG PO TABS: 500.0000 mg | ORAL_TABLET | Freq: Two times a day (BID) | ORAL | 2 refills | Status: DC

## 2017-10-30 NOTE — Addendum Note (Signed)
Addended by: Glenis Smoker on: 10/30/2017 08:45 PM   Modules accepted: Orders

## 2017-10-30 NOTE — Telephone Encounter (Signed)
Placed refill orders for:  Atorvastatin to [place] [facility] via fax.  Folic acid to [place] [facility] via fax.  Levetiracetam 500mg  60 tab x 2 refills to [facility] [address].   Please let patient know about rx at [place] if he calls back. Greatly appreciate pharmacy help with his coordination of care.  Ralene Ok, MD

## 2017-11-10 ENCOUNTER — Ambulatory Visit: Payer: Self-pay

## 2017-11-13 ENCOUNTER — Ambulatory Visit (INDEPENDENT_AMBULATORY_CARE_PROVIDER_SITE_OTHER): Payer: Self-pay | Admitting: Licensed Clinical Social Worker

## 2017-11-13 DIAGNOSIS — Z658 Other specified problems related to psychosocial circumstances: Secondary | ICD-10-CM

## 2017-11-13 NOTE — Progress Notes (Signed)
Type of Service: [place] 2nd F/U Visit Total time:55 minutes :  Interpreter:No.    Reason for follow-up: Continue brief intervention to assist patient with managing symptoms of depression, as well as managing stressors with poorly controlled diabetes.  Patient is pleasant and engaged in conversation.   Appearance:Disheveled; Thought process: Coherent; ongoing redirecting often difficult to keep on track:  Affect: Appropriate and Tearful at times.: No plan to harm self or others. Patient believes he is making progress towards goal of walking and eating healthy.  States there are barriers to eating healthy food.    LIFE CONTEXT:  [place] lives with wife ,and 3 emotional disabled adult children. All receive disability checks. 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  Last worked in 2015.  Patient has applied for disability which is pending. Family receives food benefits which does not last all month.   Life changes: Financial difficulties, finding his new normal due to not being able to work.   Goals: Patient will Increase healthy adjustment to current life circumstances. Intervention: Solution-Focused Strategies and Supportive Counseling, Reflective listening, [employer]   Issues discussed: review of goals established with previous [facility]  ; Barriers to goals ;  Difficulty with not being able to work; things patient does during the day ; patient wanting to process some of his "life struggles".  Assessment:Patient continues to experience stress related to social and health concerns.  He has financial barriers to eating healthy for poorly controlled diabetes. Patient may benefit from, and is in agreement to participate in the [facility] [employer] program and continue further assessment as well as brief therapeutic interventions to assist with managing his symptoms.  Plan: 1. Patient will F/U with  LCSW in 2 weeks 2. Behavioral recommendations: continue walking and healthy eating as outlined by Larene [place] 3. Referral: Community Resources:  Haematologist, with Nature conservation officer program at [facility]  [name], LCSW Licensed Clinical Social Worker [place]   [phone] 12:25 PM

## 2017-11-22 ENCOUNTER — Emergency Department (HOSPITAL_COMMUNITY): Payer: Self-pay

## 2017-11-22 ENCOUNTER — Inpatient Hospital Stay (HOSPITAL_COMMUNITY): Payer: Self-pay

## 2017-11-22 ENCOUNTER — Other Ambulatory Visit: Payer: Self-pay

## 2017-11-22 ENCOUNTER — Inpatient Hospital Stay (HOSPITAL_COMMUNITY)
Admission: EM | Admit: 2017-11-22 | Discharge: 2017-11-25 | DRG: 638 | Disposition: A | Discharge status: Home / Self Care | Payer: Self-pay | Attending: Family Medicine | Admitting: Family Medicine

## 2017-11-22 DIAGNOSIS — Z9842 Cataract extraction status, left eye: Secondary | ICD-10-CM

## 2017-11-22 DIAGNOSIS — F17290 Nicotine dependence, other tobacco product, uncomplicated: Secondary | ICD-10-CM | POA: Diagnosis present

## 2017-11-22 DIAGNOSIS — E1165 Type 2 diabetes mellitus with hyperglycemia: Secondary | ICD-10-CM | POA: Diagnosis present

## 2017-11-22 DIAGNOSIS — N179 Acute kidney failure, unspecified: Secondary | ICD-10-CM | POA: Diagnosis present

## 2017-11-22 DIAGNOSIS — E669 Obesity, unspecified: Secondary | ICD-10-CM | POA: Diagnosis present

## 2017-11-22 DIAGNOSIS — N529 Male erectile dysfunction, unspecified: Secondary | ICD-10-CM | POA: Diagnosis present

## 2017-11-22 DIAGNOSIS — K7460 Unspecified cirrhosis of liver: Secondary | ICD-10-CM | POA: Diagnosis present

## 2017-11-22 DIAGNOSIS — E785 Hyperlipidemia, unspecified: Secondary | ICD-10-CM | POA: Diagnosis present

## 2017-11-22 DIAGNOSIS — G4733 Obstructive sleep apnea (adult) (pediatric): Secondary | ICD-10-CM | POA: Diagnosis present

## 2017-11-22 DIAGNOSIS — I9589 Other hypotension: Secondary | ICD-10-CM | POA: Diagnosis present

## 2017-11-22 DIAGNOSIS — E861 Hypovolemia: Secondary | ICD-10-CM | POA: Diagnosis present

## 2017-11-22 DIAGNOSIS — E1140 Type 2 diabetes mellitus with diabetic neuropathy, unspecified: Secondary | ICD-10-CM

## 2017-11-22 DIAGNOSIS — E1110 Type 2 diabetes mellitus with ketoacidosis without coma: Principal | ICD-10-CM | POA: Diagnosis present

## 2017-11-22 DIAGNOSIS — Z7982 Long term (current) use of aspirin: Secondary | ICD-10-CM

## 2017-11-22 DIAGNOSIS — Z961 Presence of intraocular lens: Secondary | ICD-10-CM | POA: Diagnosis present

## 2017-11-22 DIAGNOSIS — Z8719 Personal history of other diseases of the digestive system: Secondary | ICD-10-CM

## 2017-11-22 DIAGNOSIS — I959 Hypotension, unspecified: Secondary | ICD-10-CM | POA: Diagnosis present

## 2017-11-22 DIAGNOSIS — R9431 Abnormal electrocardiogram [ECG] [EKG]: Secondary | ICD-10-CM | POA: Diagnosis present

## 2017-11-22 DIAGNOSIS — F419 Anxiety disorder, unspecified: Secondary | ICD-10-CM | POA: Diagnosis present

## 2017-11-22 DIAGNOSIS — Z888 Allergy status to other drugs, medicaments and biological substances status: Secondary | ICD-10-CM

## 2017-11-22 DIAGNOSIS — E1122 Type 2 diabetes mellitus with diabetic chronic kidney disease: Secondary | ICD-10-CM | POA: Diagnosis present

## 2017-11-22 DIAGNOSIS — E860 Dehydration: Secondary | ICD-10-CM | POA: Diagnosis present

## 2017-11-22 DIAGNOSIS — R531 Weakness: Secondary | ICD-10-CM

## 2017-11-22 DIAGNOSIS — Z9119 Patient's noncompliance with other medical treatment and regimen: Secondary | ICD-10-CM

## 2017-11-22 DIAGNOSIS — I129 Hypertensive chronic kidney disease with stage 1 through stage 4 chronic kidney disease, or unspecified chronic kidney disease: Secondary | ICD-10-CM | POA: Diagnosis present

## 2017-11-22 DIAGNOSIS — Z833 Family history of diabetes mellitus: Secondary | ICD-10-CM

## 2017-11-22 DIAGNOSIS — E1142 Type 2 diabetes mellitus with diabetic polyneuropathy: Secondary | ICD-10-CM | POA: Diagnosis present

## 2017-11-22 DIAGNOSIS — N183 Chronic kidney disease, stage 3 (moderate): Secondary | ICD-10-CM | POA: Diagnosis present

## 2017-11-22 DIAGNOSIS — E7800 Pure hypercholesterolemia, unspecified: Secondary | ICD-10-CM | POA: Diagnosis present

## 2017-11-22 DIAGNOSIS — Z79899 Other long term (current) drug therapy: Secondary | ICD-10-CM

## 2017-11-22 DIAGNOSIS — Z6834 Body mass index (BMI) 34.0-34.9, adult: Secondary | ICD-10-CM

## 2017-11-22 DIAGNOSIS — Z8674 Personal history of sudden cardiac arrest: Secondary | ICD-10-CM

## 2017-11-22 DIAGNOSIS — F1010 Alcohol abuse, uncomplicated: Secondary | ICD-10-CM

## 2017-11-22 DIAGNOSIS — K219 Gastro-esophageal reflux disease without esophagitis: Secondary | ICD-10-CM | POA: Diagnosis present

## 2017-11-22 DIAGNOSIS — Z9114 Patient's other noncompliance with medication regimen: Secondary | ICD-10-CM

## 2017-11-22 DIAGNOSIS — E871 Hypo-osmolality and hyponatremia: Secondary | ICD-10-CM | POA: Diagnosis present

## 2017-11-22 DIAGNOSIS — G40909 Epilepsy, unspecified, not intractable, without status epilepticus: Secondary | ICD-10-CM | POA: Diagnosis present

## 2017-11-22 DIAGNOSIS — Z794 Long term (current) use of insulin: Secondary | ICD-10-CM

## 2017-11-22 DIAGNOSIS — F329 Major depressive disorder, single episode, unspecified: Secondary | ICD-10-CM | POA: Diagnosis present

## 2017-11-22 LAB — CBC WITH DIFFERENTIAL/PLATELET
BASOS ABS: 0 10*3/uL (ref 0.0–0.1)
Basophils Absolute: 0 10*3/uL (ref 0.0–0.1)
Basophils Relative: 0 %
Basophils Relative: 0 %
EOS ABS: 0 10*3/uL (ref 0.0–0.7)
EOS ABS: 0.1 10*3/uL (ref 0.0–0.7)
EOS PCT: 1 %
EOS PCT: 1 %
HCT: 15.6 % — ABNORMAL LOW (ref 39.0–52.0)
HCT: 43 % (ref 39.0–52.0)
HEMOGLOBIN: 5.3 g/dL — AB (ref 13.0–17.0)
Hemoglobin: 14.9 g/dL (ref 13.0–17.0)
LYMPHS ABS: 0.7 10*3/uL (ref 0.7–4.0)
LYMPHS PCT: 16 %
Lymphocytes Relative: 18 %
Lymphs Abs: 2.4 10*3/uL (ref 0.7–4.0)
MCH: 29 pg (ref 26.0–34.0)
MCH: 29.3 pg (ref 26.0–34.0)
MCHC: 34 g/dL (ref 30.0–36.0)
MCHC: 34.7 g/dL (ref 30.0–36.0)
MCV: 85.2 fL (ref 78.0–100.0)
MCV: 84.6 fL (ref 78.0–100.0)
MONO ABS: 0.7 10*3/uL (ref 0.1–1.0)
MONOS PCT: 6 %
Monocytes Absolute: 0.2 10*3/uL (ref 0.1–1.0)
Monocytes Relative: 5 %
Neutro Abs: 3.3 10*3/uL (ref 1.7–7.7)
Neutro Abs: 10.3 10*3/uL — ABNORMAL HIGH (ref 1.7–7.7)
Neutrophils Relative %: 77 %
Neutrophils Relative %: 76 %
PLATELETS: 86 10*3/uL — AB (ref 150–400)
PLATELETS: 211 10*3/uL (ref 150–400)
RBC: 1.83 MIL/uL — AB (ref 4.22–5.81)
RBC: 5.08 MIL/uL (ref 4.22–5.81)
RDW: 13.4 % (ref 11.5–15.5)
RDW: 13.1 % (ref 11.5–15.5)
WBC: 4.3 10*3/uL (ref 4.0–10.5)
WBC: 13.5 10*3/uL — AB (ref 4.0–10.5)

## 2017-11-22 LAB — COMPREHENSIVE METABOLIC PANEL
ALT: 13 U/L — AB (ref 17–63)
AST: 22 U/L (ref 15–41)
Albumin: 3.6 g/dL (ref 3.5–5.0)
Alkaline Phosphatase: 165 U/L — ABNORMAL HIGH (ref 38–126)
Anion gap: 19 — ABNORMAL HIGH (ref 5–15)
BUN: 36 mg/dL — AB (ref 6–20)
CHLORIDE: 82 mmol/L — AB (ref 101–111)
CO2: 22 mmol/L (ref 22–32)
CREATININE: 3.77 mg/dL — AB (ref 0.61–1.24)
Calcium: 9 mg/dL (ref 8.9–10.3)
GFR, EST AFRICAN AMERICAN: 20 mL/min — AB (ref >60)
GFR, EST NON AFRICAN AMERICAN: 17 mL/min — AB (ref >60)
Glucose, Bld: 476 mg/dL — ABNORMAL HIGH (ref 65–99)
POTASSIUM: 4.8 mmol/L (ref 3.5–5.1)
SODIUM: 123 mmol/L — AB (ref 135–145)
Total Bilirubin: 1.5 mg/dL — ABNORMAL HIGH (ref 0.3–1.2)
Total Protein: 8.6 g/dL — ABNORMAL HIGH (ref 6.5–8.1)

## 2017-11-22 LAB — POC OCCULT BLOOD, ED: FECAL OCCULT BLD: NEGATIVE

## 2017-11-22 LAB — URINALYSIS, ROUTINE W REFLEX MICROSCOPIC
Bilirubin Urine: NEGATIVE
Ketones, ur: 5 mg/dL — AB
Leukocytes, UA: NEGATIVE
Nitrite: NEGATIVE
PH: 5 (ref 5.0–8.0)
Protein, ur: 30 mg/dL — AB
SPECIFIC GRAVITY, URINE: 1.015 (ref 1.005–1.030)

## 2017-11-22 LAB — I-STAT CG4 LACTIC ACID, ED
LACTIC ACID, VENOUS: 2.88 mmol/L — AB (ref 0.5–1.9)
LACTIC ACID, VENOUS: 2.53 mmol/L — AB (ref 0.5–1.9)
Lactic Acid, Venous: 0.61 mmol/L (ref 0.5–1.9)

## 2017-11-22 LAB — CBG MONITORING, ED: GLUCOSE-CAPILLARY: 507 mg/dL — AB (ref 65–99)

## 2017-11-22 LAB — RAPID URINE DRUG SCREEN, HOSP PERFORMED
Amphetamines: NOT DETECTED
BARBITURATES: NOT DETECTED
BENZODIAZEPINES: NOT DETECTED
COCAINE: NOT DETECTED
Opiates: NOT DETECTED
TETRAHYDROCANNABINOL: NOT DETECTED

## 2017-11-22 LAB — BASIC METABOLIC PANEL
Anion gap: 20 — ABNORMAL HIGH (ref 5–15)
BUN: 36 mg/dL — AB (ref 6–20)
CO2: 19 mmol/L — ABNORMAL LOW (ref 22–32)
Calcium: 8.9 mg/dL (ref 8.9–10.3)
Chloride: 85 mmol/L — ABNORMAL LOW (ref 101–111)
Creatinine, Ser: 3.7 mg/dL — ABNORMAL HIGH (ref 0.61–1.24)
GFR calc Af Amer: 20 mL/min — ABNORMAL LOW (ref >60)
GFR, EST NON AFRICAN AMERICAN: 17 mL/min — AB (ref >60)
GLUCOSE: 480 mg/dL — AB (ref 65–99)
POTASSIUM: 4.4 mmol/L (ref 3.5–5.1)
Sodium: 124 mmol/L — ABNORMAL LOW (ref 135–145)

## 2017-11-22 LAB — I-STAT CHEM 8, ED
BUN: 42 mg/dL — AB (ref 6–20)
CHLORIDE: 85 mmol/L — AB (ref 101–111)
CREATININE: 3.5 mg/dL — AB (ref 0.61–1.24)
Calcium, Ion: 1.01 mmol/L — ABNORMAL LOW (ref 1.15–1.40)
Glucose, Bld: 488 mg/dL — ABNORMAL HIGH (ref 65–99)
HEMATOCRIT: 52 % (ref 39.0–52.0)
Hemoglobin: 17.7 g/dL — ABNORMAL HIGH (ref 13.0–17.0)
POTASSIUM: 4.8 mmol/L (ref 3.5–5.1)
SODIUM: 123 mmol/L — AB (ref 135–145)
TCO2: 27 mmol/L (ref 22–32)

## 2017-11-22 LAB — VALPROIC ACID LEVEL: Valproic Acid Lvl: 21 ug/mL — ABNORMAL LOW (ref 50.0–100.0)

## 2017-11-22 LAB — I-STAT TROPONIN, ED: Troponin i, poc: 0.02 ng/mL (ref 0.00–0.08)

## 2017-11-22 LAB — CREATININE, URINE, RANDOM: CREATININE, URINE: 101.77 mg/dL

## 2017-11-22 LAB — AMMONIA: AMMONIA: 44 umol/L — AB (ref 9–35)

## 2017-11-22 LAB — OSMOLALITY: OSMOLALITY: 304 mosm/kg — AB (ref 275–295)

## 2017-11-22 LAB — ETHANOL: Alcohol, Ethyl (B): <10 mg/dL (ref <10)

## 2017-11-22 LAB — OSMOLALITY, URINE: OSMOLALITY UR: 363 mosm/kg (ref 300–900)

## 2017-11-22 MED ORDER — GABAPENTIN 300 MG PO CAPS
300.0000 mg | ORAL_CAPSULE | Freq: Two times a day (BID) | ORAL | Status: DC
  Administered 2017-11-22 – 2017-11-25 (×6): 300 mg via ORAL
  Filled 2017-11-22 (×6): qty 1

## 2017-11-22 MED ORDER — LORAZEPAM 1 MG PO TABS
1.0000 mg | ORAL_TABLET | Freq: Four times a day (QID) | ORAL | Status: DC | PRN
  Administered 2017-11-24: 1 mg via ORAL
  Filled 2017-11-22: qty 1

## 2017-11-22 MED ORDER — DIVALPROEX SODIUM ER 500 MG PO TB24
500.0000 mg | ORAL_TABLET | Freq: Two times a day (BID) | ORAL | Status: DC
  Administered 2017-11-22 – 2017-11-25 (×6): 500 mg via ORAL
  Filled 2017-11-22 (×7): qty 1

## 2017-11-22 MED ORDER — LORAZEPAM 2 MG/ML IJ SOLN: 1.0000 mg | Freq: Four times a day (QID) | INTRAMUSCULAR | Status: DC | PRN

## 2017-11-22 MED ORDER — INSULIN ASPART 100 UNIT/ML ~~LOC~~ SOLN
0.0000 [IU] | Freq: Three times a day (TID) | SUBCUTANEOUS | Status: DC
  Administered 2017-11-23: 7 [IU] via SUBCUTANEOUS
  Filled 2017-11-22: qty 1

## 2017-11-22 MED ORDER — FOLIC ACID 1 MG PO TABS
1.0000 mg | ORAL_TABLET | Freq: Every day | ORAL | Status: DC
  Administered 2017-11-23 – 2017-11-25 (×3): 1 mg via ORAL
  Filled 2017-11-22 (×3): qty 1

## 2017-11-22 MED ORDER — LACTULOSE 10 GM/15ML PO SOLN
10.0000 g | Freq: Every day | ORAL | Status: DC
  Administered 2017-11-23 – 2017-11-25 (×3): 10 g via ORAL
  Filled 2017-11-22 (×3): qty 15

## 2017-11-22 MED ORDER — LEVETIRACETAM 500 MG PO TABS
500.0000 mg | ORAL_TABLET | Freq: Two times a day (BID) | ORAL | Status: DC
  Administered 2017-11-22 – 2017-11-25 (×6): 500 mg via ORAL
  Filled 2017-11-22 (×6): qty 1

## 2017-11-22 MED ORDER — ASPIRIN EC 81 MG PO TBEC
81.0000 mg | DELAYED_RELEASE_TABLET | Freq: Every day | ORAL | Status: DC
  Administered 2017-11-22 – 2017-11-24 (×3): 81 mg via ORAL
  Filled 2017-11-22 (×3): qty 1

## 2017-11-22 MED ORDER — ADULT MULTIVITAMIN W/MINERALS CH
1.0000 | ORAL_TABLET | Freq: Every day | ORAL | Status: DC
  Administered 2017-11-23 – 2017-11-25 (×3): 1 via ORAL
  Filled 2017-11-22 (×3): qty 1

## 2017-11-22 MED ORDER — PANTOPRAZOLE SODIUM 40 MG PO TBEC
40.0000 mg | DELAYED_RELEASE_TABLET | Freq: Every day | ORAL | Status: DC
  Administered 2017-11-23 – 2017-11-25 (×3): 40 mg via ORAL
  Filled 2017-11-22 (×4): qty 1

## 2017-11-22 MED ORDER — SODIUM CHLORIDE 0.9 % IV BOLUS (SEPSIS)
1000.0000 mL | Freq: Once | INTRAVENOUS | Status: AC
  Administered 2017-11-22: 1000 mL via INTRAVENOUS

## 2017-11-22 MED ORDER — HEPARIN SODIUM (PORCINE) 5000 UNIT/ML IJ SOLN
5000.0000 [IU] | Freq: Three times a day (TID) | INTRAMUSCULAR | Status: DC
Start: 2017-11-22 — End: 2017-11-23
  Administered 2017-11-22 – 2017-11-23 (×2): 5000 [IU] via SUBCUTANEOUS
  Filled 2017-11-22 (×2): qty 1

## 2017-11-22 MED ORDER — INSULIN GLARGINE 100 UNIT/ML ~~LOC~~ SOLN
10.0000 [IU] | Freq: Every day | SUBCUTANEOUS | Status: DC
  Administered 2017-11-22: 10 [IU] via SUBCUTANEOUS
  Filled 2017-11-22: qty 0.1

## 2017-11-22 MED ORDER — ATORVASTATIN CALCIUM 80 MG PO TABS
80.0000 mg | ORAL_TABLET | Freq: Every day | ORAL | Status: DC
  Administered 2017-11-23 – 2017-11-24 (×2): 80 mg via ORAL
  Filled 2017-11-22 (×2): qty 1

## 2017-11-22 MED ORDER — VITAMIN B-1 100 MG PO TABS
100.0000 mg | ORAL_TABLET | Freq: Every day | ORAL | Status: DC
  Administered 2017-11-23 – 2017-11-25 (×3): 100 mg via ORAL
  Filled 2017-11-22 (×4): qty 1

## 2017-11-22 MED ORDER — THIAMINE HCL 100 MG/ML IJ SOLN
100.0000 mg | Freq: Every day | INTRAMUSCULAR | Status: DC
  Filled 2017-11-22: qty 2

## 2017-11-22 NOTE — ED Notes (Signed)
Dr Billy Fischer given a copy of the lactic acid results 2.53

## 2017-11-22 NOTE — ED Notes (Signed)
Per MD hold second liter of NS at this time.

## 2017-11-22 NOTE — ED Triage Notes (Signed)
Patient states he has been "feeling bad" x 1 week. Approx 1 week ago patient resumed all medication after not taking them for approx 3 months prior. Since then patient has not "felt right".

## 2017-11-22 NOTE — ED Notes (Signed)
Patient states that he fell out of bed this afternoon and "may have hit his head". Patient states he does not remember falling to floor.

## 2017-11-22 NOTE — ED Notes (Signed)
Dr Billy Fischer given a copy of lactic acid results 2.88

## 2017-11-22 NOTE — ED Notes (Signed)
Pt. To CT via stretcher. 

## 2017-11-22 NOTE — H&P (Signed)
[facility] Admission History and Physical Service Pager: [phone]  Patient name: Jeffrey [name] Medical record number: [id number] Date of birth: Apr 27, 1966 Age: 52 y.o. Gender: male  Primary Care Provider: Sela Hilding, MD Consultants: none Code Status: Full  Chief Complaint: weakness   Assessment and Plan: Jeffrey [name] is a 52 y.o. male presenting with weakness and lightheadedness. PMH is significant for seizure d/o, T2DM with neuropathy, HTN, alcohol abuse, OSA on CPAP, HLD, Anxiety, Depression, and hepatic cirrhosis.  Hypotension with weakness/lightheadedness. Likely 2/2 dehydration from medications given patient c/o lightheadness upon sitting up or standing. This is the setting of restarting many of his outpatient medications recently including some antihypertensives. BP on admission was impressively low at 73/51 which improved to 104/81 s/p 1L bolus x2 in ED. Lactic acid 2.53 but does not meet sepsis (SIRS 1/4 for leukocytosis and qSOFA 1 for hypotension). Unlikely to be syncope as patient does not describe an episode of LOC. He endorses pain at the top of his head after waking up on the floor next to his bed with concern for fall but CT head negative. DDx also includes seizures as patient has been noncompliant with keppra and has subtherapeutic valproate level of 21 but unlikely as he denies bowel/bladder incontinence or jerking movements. Unlikely to be stroke/TIA as no slurred speech or vision changes or focal weakness.  - admit to SDU, attending Dr. Mingo Amber - monitor BP closely - trend lactic acid - check orthostatic vitals - follow up Bcx and Ucx - continue home keppra and valproate  Hyponatremia with AKI. Na 123, SCr 3.7 (BL 0.8-1.2). Likely prerenal AKI. Multifactorial in the setting of recent resumption of several medications including HCTZ.Other risk factor include heavy EtOH use and h/o cirrhosis but patient appears hypovolemic on  exam. - s/p 2L NS boluses in ED - repeat BMP tonight pending, will likely start IVF to correct hyponatremia based on Na level - FEUrea labs pending - check serum and urine osmolality - monitor BMP closely - avoid nephrotoxic medications - hold home azilsartan, HCTZ, metformin' - MIVF NS @125cc /hr x 12h   EtOH abuse. H/o heavy alcohol use and claims to have cut down to 40oz every couple of days. States last drink was 11/20/17. EtOH level on admit was undetectable. He is high risk for alcohol withdrawal. - UDS neg - monitor on CIWA protocol - multivitamin, thiamine, folate  Prolonged QTC, with ST changes on EKG. Patient denies CP or palpitations. istat troponin 0.02. EKG showing worse nonspecific ST changes in II, II, AVF, V6 compared to old. QTc 511 - check troponin I x1 - am EKG - monitor on telemetry - avoid QTc prolonging medications  Uncontrolled T2DM with neuropathy. Last a1c 11.4 on 09/02/17. At home on lantus 20U qd with very short acting apidra 5U with meals or 3U with snacks. - monitor CBGs - Lantus 10U qd, titrate as needed - sSSI - continue home gabapentin 300mg  in am and 900mg  qhs  Hepatic cirrhosis. Patient noncompliant on home lactulose. No asterixis, confusion or jaundice on exam. - restart home lactulose  HLD. Chronic. - continue home atorvastatin  Tobacco abuse. Heavy user of smokeless tobacoo - nicotine patch while admitted  FEN/GI: heart healthy diet, protonix Prophylaxis: SQ heparin  Disposition: admit to SDU for close monitoring  History of Present Illness:  Jeffrey [name] is a 52 y.o. male presenting with weakness and lightheadness. He states that this all started after restarting several of his medications on 11/13/17  after being out for the past 2.5 months. Started to feel lightheaded and dizzy since Saturday 11/15/17, particularly upon standing "like going to pass out" but did not have any episodes of LOC. He states he may have fallen out of bed while  sleeping however as this afternoon he woke up on the floor and the top of his head is still hurting at 8/10; he think he may have hit the nightstand.  Has been taking azilsartan, gabapentin, prilosec, asa81,  lantus 20 every morning, apidra 5U with real meal or 3 U with small meals. His other medications are the ones that he recently restarted including HCTZ, atorvastatin, depakote, metformin. He states he has not restarted lactulose  In the ED, per ED provider initial labs were drawn from adjacent to saline line and labs had to be redrawn by a new blood draw. He was given 1L NS bolus x2.  Review Of Systems: Per HPI with the following additions:   Review of Systems  Constitutional: Negative for chills, diaphoresis and fever.  Eyes: Negative for blurred vision and double vision.  Respiratory: Negative for cough, shortness of breath and wheezing.   Cardiovascular: Negative for chest pain and palpitations.  Gastrointestinal: Negative for abdominal pain, blood in stool, constipation, diarrhea, heartburn, nausea and vomiting.  Genitourinary: Negative for dysuria, flank pain, frequency, hematuria and urgency.  Musculoskeletal: Positive for falls (woke up on floor this afternoon). Negative for back pain, joint pain and neck pain.  Skin: Negative for rash.  Neurological: Positive for dizziness, seizures (intermittent over the past few months), weakness and headaches. Negative for sensory change, focal weakness and loss of consciousness.  Psychiatric/Behavioral: Negative for hallucinations.    Patient Active Problem List   Diagnosis Date Noted  . Orthostatic syncope 09/02/2017  . Sepsis ([place])   . Hypotension 07/27/2017  . Speech disturbance 07/03/2017  . DKA (diabetic ketoacidoses) ([place]) 06/09/2017  . Hyponatremia 04/08/2017  . Seizure ([place]) 04/08/2017  . Depression 12/17/2015  . Hip pain 12/17/2015  . Proteinuria 11/23/2015  . Loss of teeth 11/23/2015  . Essential hypertension   . Altered  mental status 09/12/2015  . Diabetic polyneuropathy associated with type 2 diabetes mellitus ([place]) 08/04/2015  . Hepatic cirrhosis ([place]) 03/22/2015  . Ataxia 03/20/2015  . Aphasia   . Elevated liver enzymes   . Dysarthria 03/09/2015  . AKI (acute kidney injury) ([place])   . Alcohol intoxication ([place])   . Alcohol abuse   . Erosive esophagitis 10/10/2014  . HLD (hyperlipidemia)   . Passed out [facility]) 10/08/2014  . Hyperglycemia   . Syncope and collapse   . Dehydration 05/18/2014  . OSA on CPAP 12/28/2012  . Pulmonary edema 12/08/2012  . Cardiac arrest ([place]) 12/07/2012  . Obesity 12/07/2012  . Anoxic brain injury ([place]) 12/07/2012  . GI bleed 11/26/2012  . Venous insufficiency 12/13/2011  . 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 ALCOHOL ABUSE, HX OF 11/10/2007  . ERECTILE DYSFUNCTION 04/14/2007  . Type 2 diabetes mellitus with hemoglobin A1c goal of less than 7.5% ([place]) 12/04/2006  . HYPERCHOLESTEROLEMIA 12/04/2006  . OBESITY, NOS 12/04/2006  . Anxiety state 12/04/2006  . HYPERTENSION, BENIGN SYSTEMIC 12/04/2006  . ARTHRITIS 12/04/2006    Past Medical History: Past Medical History:  Diagnosis Date  . Acute renal insufficiency 12/08/2012  . AKI (acute kidney injury) ([place])   . ALCOHOL ABUSE, HX OF 11/10/2007  . Anoxic encephalopathy ([place])   . Anxiety   . Bleeding ulcer 2014  . Blood  transfusion 2014   "related to bleeding ulcer"  . Cardiac arrest ([place]) 12/07/2012   Anoxic encephalopathy  . GERD (gastroesophageal reflux disease)   . High cholesterol   . Hypertension   . OSA on CPAP 12/07/2012  . Seizure-like activity ([place])   . Type II diabetes mellitus ([place])   . Venous insufficiency 12/13/2011    Past Surgical History: Past Surgical History:  Procedure Laterality Date  . CARDIOVASCULAR STRESS TEST  03/16/13   Very Poor Exercise Tolerance; NON DIAGNOSTIC TEST  . CATARACT EXTRACTION W/ INTRAOCULAR LENS IMPLANT Left 03/07/2014   [place] @ [facility]   . ESOPHAGOGASTRODUODENOSCOPY Left 11/26/2012   Procedure: ESOPHAGOGASTRODUODENOSCOPY (EGD);  Surgeon: Wonda Horner, MD;  Location: [facility] ENDOSCOPY;  Service: Endoscopy;  Laterality: Left;  . ESOPHAGOGASTRODUODENOSCOPY N/A 10/10/2014   Procedure: ESOPHAGOGASTRODUODENOSCOPY (EGD);  Surgeon: Ladene Artist, MD;  Location: [facility] ENDOSCOPY;  Service: Endoscopy;  Laterality: N/A;  . KNEE ARTHROSCOPY Left ~ 1999    Social History: Social History   Tobacco Use  . Smoking status: Never Smoker  . Smokeless tobacco: Current User    Types: Snuff  Substance Use Topics  . Alcohol use: Yes    Alcohol/week: 3.6 oz    Types: 6 Cans of beer per week  . Drug use: No   Additional social history:  Thinks would need a patch. 1 can of snuff a day.  States last beer was Thursday night, 40 oz at a time usually every couple of days.  No recreational use. Lives with wife at home.  Please also refer to relevant sections of EMR.  Family History: Family History  Problem Relation Age of Onset  . Other Mother        Unsure of medical history.  . Diabetes Mellitus II Father     Allergies and Medications: Allergies  Allergen Reactions  . Lisinopril Other (See Comments)    Pt reports nose bleed.   No current facility-administered medications on file prior to encounter.    Current Outpatient Medications on File Prior to Encounter  Medication Sig Dispense Refill  . aspirin EC 81 MG tablet Take 81 mg by mouth at bedtime.     [name] atorvastatin (LIPITOR) 80 MG tablet Take 1 tablet (80 mg total) by mouth daily. (Patient taking differently: Take 80 mg by mouth at bedtime. ) 30 tablet 3  . Azilsartan Medoxomil 40 MG TABS Take 40 mg by mouth at bedtime.     . divalproex (DEPAKOTE ER) 500 MG 24 hr tablet Take 1 tablet (500 mg total) by mouth 2 (two) times daily. (Patient taking differently: Take 500 mg by mouth at bedtime. ) 180 tablet 0  . feeding supplement, GLUCERNA SHAKE, (GLUCERNA SHAKE) LIQD Take 237 mLs by mouth 2  (two) times daily between meals. (Patient taking differently: Take 237 mLs by mouth 2 (two) times daily as needed (meal replacement). ) 983 mL 14  . folic acid (FOLVITE) 1 MG tablet Take 1 tablet (1 mg total) by mouth daily. (Patient taking differently: Take 1 mg by mouth at bedtime. ) 30 tablet 12  . gabapentin (NEURONTIN) 300 MG capsule Take 1 capsule (300 mg total) by mouth 4 (four) times daily. (Patient taking differently: Take 300-600 mg by mouth See admin instructions. Take one capsule (300 mg) by mouth every morning and 3 capsules (900 mg) at night) 360 capsule 0  . hydrochlorothiazide (HYDRODIURIL) 25 MG tablet Take 25 mg by mouth at bedtime.     . insulin  aspart (NOVOLOG FLEXPEN) 100 UNIT/ML FlexPen Inject 3-5 Units into the skin See admin instructions. Inject 3 units subcutaneously up to six time daily with each small meal, inject 5 units with large meals    . insulin glargine (LANTUS) 100 unit/mL SOPN Inject 0.15 mLs (15 Units total) into the skin daily before breakfast. (Patient taking differently: Inject 20-30 Units into the skin daily before breakfast. ) 15 mL 1  . lactulose (CHRONULAC) 10 GM/15ML solution Take 15 mLs (10 g total) by mouth daily. (Patient taking differently: Take 10 g by mouth daily as needed (constipation). ) 473 mL 3  . levETIRAcetam (KEPPRA) 500 MG tablet Take 1 tablet (500 mg total) by mouth 2 (two) times daily. 60 tablet 2  . metFORMIN (GLUCOPHAGE) 500 MG tablet Take 1 tablet (500 mg total) by mouth 2 (two) times daily with a meal. 180 tablet 3  . Multiple Vitamin (MULTIVITAMIN WITH MINERALS) TABS tablet Take 1 tablet by mouth at bedtime.     [name] omeprazole (PRILOSEC) 20 MG capsule Take 20 mg by mouth at bedtime.    . polyvinyl alcohol (LIQUIFILM TEARS) 1.4 % ophthalmic solution Place 1 drop into both eyes every 4 (four) hours as needed for dry eyes. 15 mL 2  . sildenafil (VIAGRA) 100 MG tablet Take 100 mg by mouth daily as needed for erectile dysfunction.    . Insulin  Glulisine (APIDRA SOLOSTAR) 100 UNIT/ML Solostar Pen Take 2 units two times a day with your Glucerna as long as your blood sugar is higher than 200. Take 5 units with your evening meal as long as your blood sugar is higher than 100. (Patient not taking: Reported on 11/22/2017) 15 mL 3  . naproxen (NAPROSYN) 500 MG tablet Take 1 tablet (500 mg total) by mouth 2 (two) times daily with a meal. (Patient not taking: Reported on 11/22/2017) 30 tablet 0  . thiamine (VITAMIN B-1) 100 MG tablet Take 1 tablet (100 mg total) by mouth daily. (Patient not taking: Reported on 11/22/2017) 30 tablet 12    Objective: BP 109/76   Temp 97.6 F (36.4 C) (Oral)   Resp (!) 24   Ht 5\' 10"  (1.778 m)   Wt 108.9 kg (240 lb)   SpO2 97%   BMI 34.44 kg/m  Exam: General: lying in bed comfortably, in NAD. Has to close eyes to sit up due to lightheadness Eyes: EOMI, PERRL. No scleral icterus. ENTM: dry mucous membranes, oropharynx nonerythematous Neck: supple, no lymphadenopathy Cardiovascular: RRR, no murmurs Respiratory: CTAB, normal work of breathing on room air Gastrointestinal: soft, nontender, nondistended, + bowel sounds MSK: moving all limbs equally Derm: warm and dry, decreased skin turgor in LE. No jaundice Neuro: alert and awake, speech normal, grossly normal. Strength 5/5 in UE and LE. Sensation intact. No asterixis Psych: appropriate affect   Labs and Imaging: CBC BMET  Recent Labs  Lab 11/22/17 1849 11/22/17 1904  WBC 13.5*  --   HGB 14.9 17.7*  HCT 43.0 52.0  PLT 211  --    Recent Labs  Lab 11/22/17 1849 11/22/17 1904  NA 123* 123*  K 4.8 4.8  CL 82* 85*  CO2 22  --   BUN 36* 42*  CREATININE 3.77* 3.50*  GLUCOSE 476* 488*  CALCIUM 9.0  --      Valproate level 21 low EtOH level <10 Ammonia 44 elevated FOBT neg  Troponin (Point of Care Test) Recent Labs    11/22/17 1742  TROPIPOC 0.02   Lactic Acid,  Venous    Component Value Date/Time   LATICACIDVEN 2.88 (HH) 11/22/2017  1904    Urinalysis    Component Value Date/Time   COLORURINE YELLOW 11/22/2017 2021   APPEARANCEUR CLEAR 11/22/2017 2021   LABSPEC 1.015 11/22/2017 2021   PHURINE 5.0 11/22/2017 2021   GLUCOSEU >=500 (A) 11/22/2017 2021   HGBUR MODERATE (A) 11/22/2017 2021   BILIRUBINUR NEGATIVE 11/22/2017 2021   BILIRUBINUR NEG 11/23/2015 0942   KETONESUR 5 (A) 11/22/2017 2021   PROTEINUR 30 (A) 11/22/2017 2021   UROBILINOGEN 0.2 11/23/2015 0942   UROBILINOGEN 4.0 (H) 03/09/2015 0816   NITRITE NEGATIVE 11/22/2017 2021   LEUKOCYTESUR NEGATIVE 11/22/2017 2021   Drugs of Abuse     Component Value Date/Time   LABOPIA NONE DETECTED 11/22/2017 2021   COCAINSCRNUR NONE DETECTED 11/22/2017 2021   LABBENZ NONE DETECTED 11/22/2017 2021   AMPHETMU NONE DETECTED 11/22/2017 2021   THCU NONE DETECTED 11/22/2017 2021   LABBARB NONE DETECTED 11/22/2017 2021      Ct Head Wo Contrast  Result Date: 11/22/2017 CLINICAL DATA:  Headache EXAM: CT HEAD WITHOUT CONTRAST TECHNIQUE: Contiguous axial images were obtained from the base of the skull through the vertex without intravenous contrast. COMPARISON:  08/03/2017 FINDINGS: Brain: No evidence of acute infarction, hemorrhage, hydrocephalus, extra-axial collection or mass lesion/mass effect. Stable mild superficial atrophy. Vascular: No hyperdense vessel or unexpected calcification. Skull: Normal. Negative for fracture or focal lesion. Sinuses/Orbits: No acute finding. Status post left lens replacement. Other: None IMPRESSION: No acute intracranial abnormality.  Stable mild superficial atrophy. Electronically Signed   By: Ashley Royalty M.D.   On: 11/22/2017 21:28   Dg Chest Portable 1 View  Result Date: 11/22/2017 CLINICAL DATA:  Hypotension. EXAM: PORTABLE CHEST 1 VIEW COMPARISON:  07/27/2017 FINDINGS: The heart size and mediastinal contours are within normal limits. Both lungs are clear. The visualized skeletal structures are unremarkable. IMPRESSION: No active  disease. Electronically Signed   By: Kerby Moors M.D.   On: 11/22/2017 17:28    Bufford Lope, DO 11/22/2017, 8:50 PM PGY-2, [place] Intern pager: [phone], text pages welcome

## 2017-11-23 ENCOUNTER — Encounter (HOSPITAL_COMMUNITY): Payer: Self-pay | Admitting: *Deleted

## 2017-11-23 DIAGNOSIS — E860 Dehydration: Secondary | ICD-10-CM

## 2017-11-23 DIAGNOSIS — R531 Weakness: Secondary | ICD-10-CM

## 2017-11-23 DIAGNOSIS — E1110 Type 2 diabetes mellitus with ketoacidosis without coma: Principal | ICD-10-CM

## 2017-11-23 LAB — HEMOGLOBIN A1C
HEMOGLOBIN A1C: 10.8 % — AB (ref 4.8–5.6)
Mean Plasma Glucose: 263.26 mg/dL

## 2017-11-23 LAB — BASIC METABOLIC PANEL
ANION GAP: 17 — AB (ref 5–15)
ANION GAP: 16 — AB (ref 5–15)
Anion gap: 14 (ref 5–15)
Anion gap: 16 — ABNORMAL HIGH (ref 5–15)
BUN: 34 mg/dL — ABNORMAL HIGH (ref 6–20)
BUN: 34 mg/dL — ABNORMAL HIGH (ref 6–20)
BUN: 32 mg/dL — ABNORMAL HIGH (ref 6–20)
BUN: 32 mg/dL — ABNORMAL HIGH (ref 6–20)
CALCIUM: 8.9 mg/dL (ref 8.9–10.3)
CHLORIDE: 92 mmol/L — AB (ref 101–111)
CHLORIDE: 99 mmol/L — AB (ref 101–111)
CHLORIDE: 95 mmol/L — AB (ref 101–111)
CO2: 24 mmol/L (ref 22–32)
CO2: 22 mmol/L (ref 22–32)
CO2: 20 mmol/L — ABNORMAL LOW (ref 22–32)
CO2: 18 mmol/L — ABNORMAL LOW (ref 22–32)
CREATININE: 3.11 mg/dL — AB (ref 0.61–1.24)
CREATININE: 2.23 mg/dL — AB (ref 0.61–1.24)
Calcium: 8.6 mg/dL — ABNORMAL LOW (ref 8.9–10.3)
Calcium: 8.4 mg/dL — ABNORMAL LOW (ref 8.9–10.3)
Calcium: 8.3 mg/dL — ABNORMAL LOW (ref 8.9–10.3)
Chloride: 90 mmol/L — ABNORMAL LOW (ref 101–111)
Creatinine, Ser: 2.74 mg/dL — ABNORMAL HIGH (ref 0.61–1.24)
Creatinine, Ser: 2.23 mg/dL — ABNORMAL HIGH (ref 0.61–1.24)
GFR calc non Af Amer: 21 mL/min — ABNORMAL LOW (ref >60)
GFR, EST AFRICAN AMERICAN: 25 mL/min — AB (ref >60)
GFR, EST AFRICAN AMERICAN: 29 mL/min — AB (ref >60)
GFR, EST AFRICAN AMERICAN: 37 mL/min — AB (ref >60)
GFR, EST AFRICAN AMERICAN: 37 mL/min — AB (ref >60)
GFR, EST NON AFRICAN AMERICAN: 25 mL/min — AB (ref >60)
GFR, EST NON AFRICAN AMERICAN: 32 mL/min — AB (ref >60)
GFR, EST NON AFRICAN AMERICAN: 32 mL/min — AB (ref >60)
Glucose, Bld: 373 mg/dL — ABNORMAL HIGH (ref 65–99)
Glucose, Bld: 331 mg/dL — ABNORMAL HIGH (ref 65–99)
Glucose, Bld: 119 mg/dL — ABNORMAL HIGH (ref 65–99)
Glucose, Bld: 154 mg/dL — ABNORMAL HIGH (ref 65–99)
POTASSIUM: 4.7 mmol/L (ref 3.5–5.1)
POTASSIUM: 5.3 mmol/L — AB (ref 3.5–5.1)
POTASSIUM: 3.9 mmol/L (ref 3.5–5.1)
Potassium: 3.8 mmol/L (ref 3.5–5.1)
SODIUM: 131 mmol/L — AB (ref 135–145)
SODIUM: 130 mmol/L — AB (ref 135–145)
SODIUM: 133 mmol/L — AB (ref 135–145)
SODIUM: 129 mmol/L — AB (ref 135–145)

## 2017-11-23 LAB — CBC
HCT: 44.7 % (ref 39.0–52.0)
HEMOGLOBIN: 15 g/dL (ref 13.0–17.0)
MCH: 28.3 pg (ref 26.0–34.0)
MCHC: 33.6 g/dL (ref 30.0–36.0)
MCV: 84.3 fL (ref 78.0–100.0)
PLATELETS: 214 10*3/uL (ref 150–400)
RBC: 5.3 MIL/uL (ref 4.22–5.81)
RDW: 13.2 % (ref 11.5–15.5)
WBC: 10.5 10*3/uL (ref 4.0–10.5)

## 2017-11-23 LAB — GLUCOSE, CAPILLARY
GLUCOSE-CAPILLARY: 382 mg/dL — AB (ref 65–99)
GLUCOSE-CAPILLARY: 354 mg/dL — AB (ref 65–99)
GLUCOSE-CAPILLARY: 328 mg/dL — AB (ref 65–99)
GLUCOSE-CAPILLARY: 185 mg/dL — AB (ref 65–99)
GLUCOSE-CAPILLARY: 76 mg/dL (ref 65–99)
GLUCOSE-CAPILLARY: 82 mg/dL (ref 65–99)
GLUCOSE-CAPILLARY: 118 mg/dL — AB (ref 65–99)
GLUCOSE-CAPILLARY: 161 mg/dL — AB (ref 65–99)
GLUCOSE-CAPILLARY: 206 mg/dL — AB (ref 65–99)
GLUCOSE-CAPILLARY: 200 mg/dL — AB (ref 65–99)
Glucose-Capillary: 131 mg/dL — ABNORMAL HIGH (ref 65–99)
Glucose-Capillary: 178 mg/dL — ABNORMAL HIGH (ref 65–99)
Glucose-Capillary: 208 mg/dL — ABNORMAL HIGH (ref 65–99)
Glucose-Capillary: 98 mg/dL (ref 65–99)

## 2017-11-23 LAB — BLOOD CULTURE ID PANEL (REFLEXED)
ACINETOBACTER BAUMANNII: NOT DETECTED
CANDIDA ALBICANS: NOT DETECTED
CANDIDA GLABRATA: NOT DETECTED
CANDIDA TROPICALIS: NOT DETECTED
Candida krusei: NOT DETECTED
Candida parapsilosis: NOT DETECTED
Carbapenem resistance: NOT DETECTED
ENTEROBACTER CLOACAE COMPLEX: NOT DETECTED
Enterococcus species: NOT DETECTED
Escherichia coli: NOT DETECTED
Haemophilus influenzae: NOT DETECTED
KLEBSIELLA OXYTOCA: NOT DETECTED
Klebsiella pneumoniae: NOT DETECTED
Listeria monocytogenes: NOT DETECTED
Methicillin resistance: NOT DETECTED
NEISSERIA MENINGITIDIS: NOT DETECTED
Pseudomonas aeruginosa: NOT DETECTED
SERRATIA MARCESCENS: NOT DETECTED
STAPHYLOCOCCUS SPECIES: DETECTED — AB
STREPTOCOCCUS PYOGENES: NOT DETECTED
STREPTOCOCCUS SPECIES: NOT DETECTED
Staphylococcus aureus (BCID): NOT DETECTED
Streptococcus agalactiae: NOT DETECTED
Streptococcus pneumoniae: NOT DETECTED

## 2017-11-23 LAB — CBG MONITORING, ED: Glucose-Capillary: 340 mg/dL — ABNORMAL HIGH (ref 65–99)

## 2017-11-23 LAB — PHOSPHORUS: PHOSPHORUS: 4.1 mg/dL (ref 2.5–4.6)

## 2017-11-23 LAB — TROPONIN I
Troponin I: 0.05 ng/mL (ref <0.03)
Troponin I: 0.04 ng/mL (ref <0.03)
Troponin I: <0.03 ng/mL (ref <0.03)

## 2017-11-23 LAB — LACTIC ACID, PLASMA
Lactic Acid, Venous: 2.9 mmol/L (ref 0.5–1.9)
Lactic Acid, Venous: 2.2 mmol/L (ref 0.5–1.9)

## 2017-11-23 LAB — MAGNESIUM: MAGNESIUM: 2.5 mg/dL — AB (ref 1.7–2.4)

## 2017-11-23 MED ORDER — SODIUM CHLORIDE 0.9 % IV BOLUS (SEPSIS): 1000.0000 mL | Freq: Once | INTRAVENOUS | Status: DC

## 2017-11-23 MED ORDER — SODIUM CHLORIDE 0.9 % IV SOLN
INTRAVENOUS | Status: DC
  Administered 2017-11-23: 11:00:00 via INTRAVENOUS

## 2017-11-23 MED ORDER — POTASSIUM CHLORIDE 10 MEQ/100ML IV SOLN
10.0000 meq | INTRAVENOUS | Status: AC
  Administered 2017-11-23 (×2): 10 meq via INTRAVENOUS
  Filled 2017-11-23 (×2): qty 100

## 2017-11-23 MED ORDER — SODIUM CHLORIDE 0.9 % IV SOLN
INTRAVENOUS | Status: DC
  Administered 2017-11-23: 3.2 [IU]/h via INTRAVENOUS
  Filled 2017-11-23: qty 1

## 2017-11-23 MED ORDER — SODIUM CHLORIDE 0.9 % IV SOLN
INTRAVENOUS | Status: DC
  Administered 2017-11-23: 02:00:00 via INTRAVENOUS

## 2017-11-23 MED ORDER — HEPARIN SODIUM (PORCINE) 5000 UNIT/ML IJ SOLN
5000.0000 [IU] | Freq: Three times a day (TID) | INTRAMUSCULAR | Status: DC
  Administered 2017-11-23 – 2017-11-25 (×6): 5000 [IU] via SUBCUTANEOUS
  Filled 2017-11-23 (×6): qty 1

## 2017-11-23 MED ORDER — NICOTINE 7 MG/24HR TD PT24
7.0000 mg | MEDICATED_PATCH | Freq: Every day | TRANSDERMAL | Status: DC
  Administered 2017-11-23 – 2017-11-25 (×3): 7 mg via TRANSDERMAL
  Filled 2017-11-23 (×3): qty 1

## 2017-11-23 MED ORDER — DEXTROSE-NACL 5-0.45 % IV SOLN
INTRAVENOUS | Status: DC
  Administered 2017-11-23: 1000 mL via INTRAVENOUS
  Administered 2017-11-23 – 2017-11-24 (×2): via INTRAVENOUS

## 2017-11-23 MED ORDER — SODIUM CHLORIDE 0.9 % IV SOLN
INTRAVENOUS | Status: AC
  Administered 2017-11-23: 09:00:00 via INTRAVENOUS

## 2017-11-23 MED ORDER — SODIUM CHLORIDE 0.9 % IV BOLUS (SEPSIS)
1000.0000 mL | Freq: Once | INTRAVENOUS | Status: AC
  Administered 2017-11-23: 1000 mL via INTRAVENOUS

## 2017-11-23 NOTE — ED Notes (Addendum)
Test: Troponin Critical Value: 0.05  Name of Provider Notified:   Orders Received? Or Actions Taken?: MD paged

## 2017-11-23 NOTE — Progress Notes (Signed)
Received a call from pharmacy that patient's blood cultures are growing staph species in 1/2 bottles. Not having any fevers or infectious symptoms, so doubt true bacteremia. Likely a contaminant. Will redraw another set of blood cultures now and hold off on starting antibiotics at this time.  Hyman Bible, MD PGY-3

## 2017-11-23 NOTE — ED Notes (Addendum)
92/60 Manual BP. Admitting MD made aware. Pt. mentation WDL. Was told to start another bolus. Will continue to monitor.

## 2017-11-23 NOTE — Progress Notes (Signed)
PHARMACY - PHYSICIAN COMMUNICATION CRITICAL VALUE ALERT - BLOOD CULTURE IDENTIFICATION (BCID)  Results for orders placed or performed during the hospital encounter of 11/22/17  Blood Culture ID Panel (Reflexed) (Collected: 11/22/2017  4:58 PM)  Result Value Ref Range   Enterococcus species NOT DETECTED NOT DETECTED   Listeria monocytogenes NOT DETECTED NOT DETECTED   Staphylococcus species DETECTED (A) NOT DETECTED   Staphylococcus aureus NOT DETECTED NOT DETECTED   Methicillin resistance NOT DETECTED NOT DETECTED   Streptococcus species NOT DETECTED NOT DETECTED   Streptococcus agalactiae NOT DETECTED NOT DETECTED   Streptococcus pneumoniae NOT DETECTED NOT DETECTED   Streptococcus pyogenes NOT DETECTED NOT DETECTED   Acinetobacter baumannii NOT DETECTED NOT DETECTED   Enterobacter cloacae complex NOT DETECTED NOT DETECTED   Escherichia coli NOT DETECTED NOT DETECTED   Klebsiella oxytoca NOT DETECTED NOT DETECTED   Klebsiella pneumoniae NOT DETECTED NOT DETECTED   Serratia marcescens NOT DETECTED NOT DETECTED   Carbapenem resistance NOT DETECTED NOT DETECTED   Haemophilus influenzae NOT DETECTED NOT DETECTED   Neisseria meningitidis NOT DETECTED NOT DETECTED   Pseudomonas aeruginosa NOT DETECTED NOT DETECTED   Candida albicans NOT DETECTED NOT DETECTED   Candida glabrata NOT DETECTED NOT DETECTED   Candida krusei NOT DETECTED NOT DETECTED   Candida parapsilosis NOT DETECTED NOT DETECTED   Candida tropicalis NOT DETECTED NOT DETECTED    Name of physician (or Provider) Contacted: Dr. Brett Albino  Changes to prescribed antibiotics required: not on antibiotics- recommend to watch clinically as likely contaminant. MD agreed with plan  Arella Blinder 11/23/2017  4:40 PM

## 2017-11-23 NOTE — ED Provider Notes (Signed)
[facility] PROGRESSIVE CARE Provider Note   CSN: [id number] Arrival date & time: 11/22/17  1612     History   Chief Complaint Chief Complaint  Patient presents with  . Weakness    HPI Jeffrey [name] is a 52 y.o. male.  HPI   52yo male with history of type II DM, htn, hlpd, OSA, seizures, cardiac arrest with anoxic encephalopathy presents with concern for generalized weakness in setting of recently restarting several of his medications.  Reports he had been off of his metformin, gabapentin, depakote for the last 2 months and just restarted all of his medications about one week ago. After that, he began to feel severe generalized weakness, fatigue, lightheadedness particularly when trying to stand. Reports he has been laying in the bed nearly all week.  Reports this morning he was trying to get up and woke up on the floor.  He is a poor historian, and while he reports seizures x 15 this week, when he describes them at times it sounds he is describing orthostatic changes and lightheadedness when he goes to sit up, and then says he "felt like he was going to have a seizure" during several of these episodes but did not. Denies fevers, cough, urinary symptoms, abdominal pain, rash.  Denies black or bloody stool.    Past Medical History:  Diagnosis Date  . Acute renal insufficiency 12/08/2012  . AKI (acute kidney injury) ([place])   . ALCOHOL ABUSE, HX OF 11/10/2007  . Anoxic encephalopathy ([place])   . Anxiety   . Bleeding ulcer 2014  . Blood transfusion 2014   "related to bleeding ulcer"  . Cardiac arrest ([place]) 12/07/2012   Anoxic encephalopathy  . GERD (gastroesophageal reflux disease)   . High cholesterol   . Hypertension   . OSA on CPAP 12/07/2012  . Seizure-like activity ([place])   . Type II diabetes mellitus ([place])   . Venous insufficiency 12/13/2011    Patient Active Problem List   Diagnosis Date Noted  . Orthostatic syncope 09/02/2017  . Sepsis ([place])   . Hypotension 07/27/2017    . Speech disturbance 07/03/2017  . DKA (diabetic ketoacidoses) ([place]) 06/09/2017  . Hyponatremia 04/08/2017  . Seizure ([place]) 04/08/2017  . Depression 12/17/2015  . Hip pain 12/17/2015  . Proteinuria 11/23/2015  . Loss of teeth 11/23/2015  . Essential hypertension   . Altered mental status 09/12/2015  . Diabetic polyneuropathy associated with type 2 diabetes mellitus ([place]) 08/04/2015  . Hepatic cirrhosis ([place]) 03/22/2015  . Ataxia 03/20/2015  . Aphasia   . Elevated liver enzymes   . Dysarthria 03/09/2015  . AKI (acute kidney injury) ([place])   . Alcohol intoxication ([place])   . ETOH abuse   . Erosive esophagitis 10/10/2014  . HLD (hyperlipidemia)   . Passed out [facility]) 10/08/2014  . Hyperglycemia   . Syncope and collapse   . Dehydration 05/18/2014  . OSA on CPAP 12/28/2012  . Pulmonary edema 12/08/2012  . Cardiac arrest ([place]) 12/07/2012  . Obesity 12/07/2012  . Anoxic brain injury ([place]) 12/07/2012  . GI bleed 11/26/2012  . Venous insufficiency 12/13/2011  . 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 ALCOHOL ABUSE, HX OF 11/10/2007  . ERECTILE DYSFUNCTION 04/14/2007  . Type 2 diabetes mellitus with diabetic neuropathy, with long-term current use of insulin ([place]) 12/04/2006  . HYPERCHOLESTEROLEMIA 12/04/2006  . OBESITY, NOS 12/04/2006  . Anxiety state 12/04/2006  . HYPERTENSION, BENIGN SYSTEMIC 12/04/2006  . ARTHRITIS 12/04/2006  Past Surgical History:  Procedure Laterality Date  . CARDIOVASCULAR STRESS TEST  03/16/13   Very Poor Exercise Tolerance; NON DIAGNOSTIC TEST  . CATARACT EXTRACTION W/ INTRAOCULAR LENS IMPLANT Left 03/07/2014   [place] @ [facility]  . ESOPHAGOGASTRODUODENOSCOPY Left 11/26/2012   Procedure: ESOPHAGOGASTRODUODENOSCOPY (EGD);  Surgeon: Wonda Horner, MD;  Location: [facility] ENDOSCOPY;  Service: Endoscopy;  Laterality: Left;  . ESOPHAGOGASTRODUODENOSCOPY N/A 10/10/2014   Procedure: ESOPHAGOGASTRODUODENOSCOPY (EGD);  Surgeon: Ladene Artist, MD;  Location: [facility] ENDOSCOPY;  Service: Endoscopy;  Laterality: N/A;  . KNEE ARTHROSCOPY Left ~ 1999       Home Medications    Prior to Admission medications   Medication Sig Start Date End Date Taking? Authorizing Provider  aspirin EC 81 MG tablet Take 81 mg by mouth at bedtime.    Yes [provider]  atorvastatin (LIPITOR) 80 MG tablet Take 1 tablet (80 mg total) by mouth daily. Patient taking differently: Take 80 mg by mouth at bedtime.  10/30/17  Yes Sela Hilding, MD  Azilsartan Medoxomil 40 MG TABS Take 40 mg by mouth at bedtime.    Yes [provider]  divalproex (DEPAKOTE ER) 500 MG 24 hr tablet Take 1 tablet (500 mg total) by mouth 2 (two) times daily. Patient taking differently: Take 500 mg by mouth at bedtime.  09/02/17  Yes Mercy Riding, MD  feeding supplement, GLUCERNA SHAKE, (GLUCERNA SHAKE) LIQD Take 237 mLs by mouth 2 (two) times daily between meals. Patient taking differently: Take 237 mLs by mouth 2 (two) times daily as needed (meal replacement).  06/12/17  Yes Smiley Houseman, MD  folic acid (FOLVITE) 1 MG tablet Take 1 tablet (1 mg total) by mouth daily. Patient taking differently: Take 1 mg by mouth at bedtime.  10/30/17  Yes Sela Hilding, MD  gabapentin (NEURONTIN) 300 MG capsule Take 1 capsule (300 mg total) by mouth 4 (four) times daily. Patient taking differently: Take 300-600 mg by mouth See admin instructions. Take one capsule (300 mg) by mouth every morning and 3 capsules (900 mg) at night 06/26/17  Yes Eniola, Kehinde T, MD  hydrochlorothiazide (HYDRODIURIL) 25 MG tablet Take 25 mg by mouth at bedtime.    Yes [provider]  insulin aspart (NOVOLOG FLEXPEN) 100 UNIT/ML FlexPen Inject 3-5 Units into the skin See admin instructions. Inject 3 units subcutaneously up to six time daily with each small meal, inject 5 units with large meals   Yes [provider]  insulin glargine (LANTUS) 100 unit/mL SOPN  Inject 0.15 mLs (15 Units total) into the skin daily before breakfast. Patient taking differently: Inject 20-30 Units into the skin daily before breakfast.  09/02/17  Yes McDiarmid, Blane Ohara, MD  lactulose (CHRONULAC) 10 GM/15ML solution Take 15 mLs (10 g total) by mouth daily. Patient taking differently: Take 10 g by mouth daily as needed (constipation).  07/03/17  Yes Carlyle Dolly, MD  levETIRAcetam (KEPPRA) 500 MG tablet Take 1 tablet (500 mg total) by mouth 2 (two) times daily. 10/30/17  Yes Sela Hilding, MD  metFORMIN (GLUCOPHAGE) 500 MG tablet Take 1 tablet (500 mg total) by mouth 2 (two) times daily with a meal. 09/22/17  Yes Mercy Riding, MD  Multiple Vitamin (MULTIVITAMIN WITH MINERALS) TABS tablet Take 1 tablet by mouth at bedtime.    Yes [provider]  omeprazole (PRILOSEC) 20 MG capsule Take 20 mg by mouth at bedtime.   Yes [provider]  polyvinyl alcohol (LIQUIFILM  TEARS) 1.4 % ophthalmic solution Place 1 drop into both eyes every 4 (four) hours as needed for dry eyes. 06/19/16  Yes Ronnie Doss M, DO  sildenafil (VIAGRA) 100 MG tablet Take 100 mg by mouth daily as needed for erectile dysfunction.   Yes [provider]  Insulin Glulisine (APIDRA SOLOSTAR) 100 UNIT/ML Solostar Pen Take 2 units two times a day with your Glucerna as long as your blood sugar is higher than 200. Take 5 units with your evening meal as long as your blood sugar is higher than 100. Patient not taking: Reported on 11/22/2017 09/02/17   Mercy Riding, MD  naproxen (NAPROSYN) 500 MG tablet Take 1 tablet (500 mg total) by mouth 2 (two) times daily with a meal. Patient not taking: Reported on 11/22/2017 09/22/17   Bufford Lope, DO  thiamine (VITAMIN B-1) 100 MG tablet Take 1 tablet (100 mg total) by mouth daily. Patient not taking: Reported on 11/22/2017 05/27/16   [name], DO    Family History Family History  Problem Relation Age of Onset  . Other Mother         Unsure of medical history.  . Diabetes Mellitus II Father     Social History Social History   Tobacco Use  . Smoking status: Never Smoker  . Smokeless tobacco: Current User    Types: Snuff  Substance Use Topics  . Alcohol use: Yes    Alcohol/week: 3.6 oz    Types: 6 Cans of beer per week  . Drug use: No     Allergies   Lisinopril   Review of Systems Review of Systems  Constitutional: Positive for appetite change and fatigue. Negative for fever.  HENT: Negative for sore throat.   Eyes: Negative for visual disturbance.  Respiratory: Negative for cough and shortness of breath.   Cardiovascular: Negative for chest pain.  Gastrointestinal: Negative for abdominal pain, nausea and vomiting.  Genitourinary: Negative for difficulty urinating.  Musculoskeletal: Negative for back pain and neck stiffness.  Skin: Negative for rash.  Neurological: Positive for seizures, syncope and light-headedness (possible). Negative for facial asymmetry, weakness, numbness and headaches.     Physical Exam Updated Vital Signs BP (!) 127/100 (BP Location: Left Arm)   Pulse 86   Temp (!) 97.5 F (36.4 C) (Oral)   Resp 17   Ht 5\' 10"  (1.778 m)   Wt 108.9 kg (240 lb)   SpO2 100%   BMI 34.44 kg/m   Physical Exam  Constitutional: He is oriented to person, place, and time. He appears well-developed and well-nourished. No distress.  HENT:  Head: Normocephalic and atraumatic.  Eyes: Conjunctivae and EOM are normal.  Neck: Normal range of motion.  Cardiovascular: Normal rate, regular rhythm, normal heart sounds and intact distal pulses. Exam reveals no gallop and no friction rub.  No murmur heard. Pulmonary/Chest: Effort normal and breath sounds normal. No respiratory distress. He has no wheezes. He has no rales.  Abdominal: Soft. He exhibits no distension. There is no tenderness. There is no guarding.  Genitourinary: Rectal exam shows guaiac negative stool (normal stool color).   Musculoskeletal: He exhibits no edema.  Neurological: He is alert and oriented to person, place, and time.  Skin: Skin is warm and dry. He is not diaphoretic.  Nursing note and vitals reviewed.    ED Treatments / Results  Labs (all labs ordered are listed, but only abnormal results are displayed) Labs Reviewed  CBC WITH DIFFERENTIAL/PLATELET - Abnormal; Notable for  the following components:      Result Value   RBC 1.83 (*)    Hemoglobin 5.3 (*)    HCT 15.6 (*)    Platelets 86 (*)    All other components within normal limits  VALPROIC ACID LEVEL - Abnormal; Notable for the following components:   Valproic Acid Lvl <10 (*)    All other components within normal limits  URINALYSIS, ROUTINE W REFLEX MICROSCOPIC - Abnormal; Notable for the following components:   Glucose, UA >=500 (*)    Hgb urine dipstick MODERATE (*)    Ketones, ur 5 (*)    Protein, ur 30 (*)    Bacteria, UA RARE (*)    Squamous Epithelial / LPF 0-5 (*)    All other components within normal limits  CBC WITH DIFFERENTIAL/PLATELET - Abnormal; Notable for the following components:   WBC 13.5 (*)    Neutro Abs 10.3 (*)    All other components within normal limits  COMPREHENSIVE METABOLIC PANEL - Abnormal; Notable for the following components:   Sodium 123 (*)    Chloride 82 (*)    Glucose, Bld 476 (*)    BUN 36 (*)    Creatinine, Ser 3.77 (*)    Total Protein 8.6 (*)    ALT 13 (*)    Alkaline Phosphatase 165 (*)    Total Bilirubin 1.5 (*)    GFR calc non Af Amer 17 (*)    GFR calc Af Amer 20 (*)    Anion gap 19 (*)    All other components within normal limits  VALPROIC ACID LEVEL - Abnormal; Notable for the following components:   Valproic Acid Lvl 21 (*)    All other components within normal limits  AMMONIA - Abnormal; Notable for the following components:   Ammonia 44 (*)    All other components within normal limits  BASIC METABOLIC PANEL - Abnormal; Notable for the following components:   Sodium 124  (*)    Chloride 85 (*)    CO2 19 (*)    Glucose, Bld 480 (*)    BUN 36 (*)    Creatinine, Ser 3.70 (*)    GFR calc non Af Amer 17 (*)    GFR calc Af Amer 20 (*)    Anion gap 20 (*)    All other components within normal limits  OSMOLALITY - Abnormal; Notable for the following components:   Osmolality 304 (*)    All other components within normal limits  LACTIC ACID, PLASMA - Abnormal; Notable for the following components:   Lactic Acid, Venous 2.9 (*)    All other components within normal limits  LACTIC ACID, PLASMA - Abnormal; Notable for the following components:   Lactic Acid, Venous 2.2 (*)    All other components within normal limits  TROPONIN I - Abnormal; Notable for the following components:   Troponin I 0.05 (*)    All other components within normal limits  BASIC METABOLIC PANEL - Abnormal; Notable for the following components:   Sodium 131 (*)    Chloride 90 (*)    Glucose, Bld 373 (*)    BUN 34 (*)    Creatinine, Ser 3.11 (*)    GFR calc non Af Amer 21 (*)    GFR calc Af Amer 25 (*)    Anion gap 17 (*)    All other components within normal limits  HEMOGLOBIN A1C - Abnormal; Notable for the following components:   Hgb A1c MFr Bld  10.8 (*)    All other components within normal limits  MAGNESIUM - Abnormal; Notable for the following components:   Magnesium 2.5 (*)    All other components within normal limits  TROPONIN I - Abnormal; Notable for the following components:   Troponin I 0.04 (*)    All other components within normal limits  GLUCOSE, CAPILLARY - Abnormal; Notable for the following components:   Glucose-Capillary 382 (*)    All other components within normal limits  GLUCOSE, CAPILLARY - Abnormal; Notable for the following components:   Glucose-Capillary 354 (*)    All other components within normal limits  GLUCOSE, CAPILLARY - Abnormal; Notable for the following components:   Glucose-Capillary 328 (*)    All other components within normal limits    I-STAT CHEM 8, ED - Abnormal; Notable for the following components:   Sodium 123 (*)    Chloride 85 (*)    BUN 42 (*)    Creatinine, Ser 3.50 (*)    Glucose, Bld 488 (*)    Calcium, Ion 1.01 (*)    Hemoglobin 17.7 (*)    All other components within normal limits  I-STAT CG4 LACTIC ACID, ED - Abnormal; Notable for the following components:   Lactic Acid, Venous 2.88 (*)    All other components within normal limits  I-STAT CG4 LACTIC ACID, ED - Abnormal; Notable for the following components:   Lactic Acid, Venous 2.53 (*)    All other components within normal limits  CBG MONITORING, ED - Abnormal; Notable for the following components:   Glucose-Capillary 507 (*)    All other components within normal limits  CBG MONITORING, ED - Abnormal; Notable for the following components:   Glucose-Capillary 340 (*)    All other components within normal limits  CULTURE, BLOOD (ROUTINE X 2)  URINE CULTURE  CULTURE, BLOOD (ROUTINE X 2)  MRSA PCR SCREENING  ETHANOL  RAPID URINE DRUG SCREEN, HOSP PERFORMED  CREATININE, URINE, RANDOM  OSMOLALITY, URINE  CBC  PHOSPHORUS  UREA NITROGEN, URINE  TROPONIN I  BASIC METABOLIC PANEL  BASIC METABOLIC PANEL  BASIC METABOLIC PANEL  I-STAT CG4 LACTIC ACID, ED  I-STAT TROPONIN, ED  I-STAT CHEM 8, ED  POC OCCULT BLOOD, ED  I-STAT TROPONIN, ED  I-STAT CG4 LACTIC ACID, ED    EKG  EKG Interpretation  Date/Time:  Saturday November 22 2017 16:14:31 EST Ventricular Rate:  90 PR Interval:    QRS Duration: 109 QT Interval:  417 QTC Calculation: 511 R Axis:   51 Text Interpretation:  Sinus rhythm Consider right atrial enlargement Nonspecific repol abnormality, inferior leads Hyperacute TW anterior leads Prolonged QT interval Nonspecific repolarization abnormality new from prior, TW larger than prior ECG however similar to past ECGs Confirmed by Gareth Morgan [phone]) on 11/23/2017 11:28:27 AM       Radiology Ct Head Wo Contrast  Result Date:  11/22/2017 CLINICAL DATA:  Headache EXAM: CT HEAD WITHOUT CONTRAST TECHNIQUE: Contiguous axial images were obtained from the base of the skull through the vertex without intravenous contrast. COMPARISON:  08/03/2017 FINDINGS: Brain: No evidence of acute infarction, hemorrhage, hydrocephalus, extra-axial collection or mass lesion/mass effect. Stable mild superficial atrophy. Vascular: No hyperdense vessel or unexpected calcification. Skull: Normal. Negative for fracture or focal lesion. Sinuses/Orbits: No acute finding. Status post left lens replacement. Other: None IMPRESSION: No acute intracranial abnormality.  Stable mild superficial atrophy. Electronically Signed   By: Ashley Royalty M.D.   On: 11/22/2017 21:28   Dg Chest Portable  1 View  Result Date: 11/22/2017 CLINICAL DATA:  Hypotension. EXAM: PORTABLE CHEST 1 VIEW COMPARISON:  07/27/2017 FINDINGS: The heart size and mediastinal contours are within normal limits. Both lungs are clear. The visualized skeletal structures are unremarkable. IMPRESSION: No active disease. Electronically Signed   By: Kerby Moors M.D.   On: 11/22/2017 17:28    Procedures .Critical Care Performed by: Gareth Morgan, MD Authorized by: Gareth Morgan, MD   Critical care provider statement:    Critical care time (minutes):  30   Critical care was necessary to treat or prevent imminent or life-threatening deterioration of the following conditions:  Renal failure and dehydration   Critical care was time spent personally by me on the following activities:  Blood draw for specimens, evaluation of patient's response to treatment, obtaining history from patient or surrogate, ordering and review of laboratory studies, ordering and review of radiographic studies, re-evaluation of patient's condition and review of old charts   (including critical care time)  Medications Ordered in ED Medications  aspirin EC tablet 81 mg (81 mg Oral Given 11/22/17 2345)  atorvastatin  (LIPITOR) tablet 80 mg (not administered)  divalproex (DEPAKOTE ER) 24 hr tablet 500 mg (500 mg Oral Given 11/23/17 1123)  gabapentin (NEURONTIN) capsule 300-900 mg (300 mg Oral Given 11/23/17 1124)  lactulose (CHRONULAC) 10 GM/15ML solution 10 g (10 g Oral Given 11/23/17 1130)  levETIRAcetam (KEPPRA) tablet 500 mg (500 mg Oral Given 11/23/17 1123)  pantoprazole (PROTONIX) EC tablet 40 mg (40 mg Oral Given 11/23/17 1130)  LORazepam (ATIVAN) tablet 1 mg (not administered)    Or  LORazepam (ATIVAN) injection 1 mg (not administered)  thiamine (VITAMIN B-1) tablet 100 mg (100 mg Oral Given 11/23/17 1130)    Or  thiamine (B-1) injection 100 mg ( Intravenous See Alternative 1/61/09 6045)  folic acid (FOLVITE) tablet 1 mg (1 mg Oral Given 11/23/17 1130)  multivitamin with minerals tablet 1 tablet (1 tablet Oral Given 11/23/17 1130)  nicotine (NICODERM CQ - dosed in mg/24 hr) patch 7 mg (7 mg Transdermal Patch Applied 11/23/17 0612)  0.9 %  sodium chloride infusion ( Intravenous New Bag/Given 11/23/17 0859)  0.9 %  sodium chloride infusion ( Intravenous New Bag/Given 11/23/17 1122)  dextrose 5 %-0.45 % sodium chloride infusion (not administered)  insulin regular (NOVOLIN R,HUMULIN R) 100 Units in sodium chloride 0.9 % 100 mL (1 Units/mL) infusion (8 Units/hr Intravenous Rate/Dose Change 11/23/17 1122)  heparin injection 5,000 Units (5,000 Units Subcutaneous Not Given 11/23/17 1132)  potassium chloride 10 mEq in 100 mL IVPB (10 mEq Intravenous New Bag/Given 11/23/17 1132)  sodium chloride 0.9 % bolus 1,000 mL (0 mLs Intravenous Stopped 11/22/17 1918)  sodium chloride 0.9 % bolus 1,000 mL (0 mLs Intravenous Stopped 11/23/17 0001)  sodium chloride 0.9 % bolus 1,000 mL (0 mLs Intravenous Stopped 11/23/17 0718)     Initial Impression / Assessment and Plan / ED Course  I have reviewed the triage vital signs and the nursing notes.  Pertinent labs & imaging results that were available during my care of the patient were  reviewed by me and considered in my medical decision making (see chart for details).     52yo male with history of type II DM, htn, hlpd, OSA, seizures, cardiac arrest with anoxic encephalopathy presents with concern for generalized weakness in setting of recently restarting several of his medications.  Patient arrives with blood pressures in the 70s, awake, answering questions.  He is afebrile, not tachycardic, denies infectious symptoms,  has no sign of infection on urinalysis or CXR, and doubt sepsis.  Denies symptoms of bleeding--initial blood work showed hgb 5 however this was likely from contaminated saline line.    Repeat labs show normal hemoglobin, however do also show acute kidney injury with a creatinine of 3.7 from previous .8.  Suspect hypotension likely in the setting of starting several medications after not taking them for several months, with decreased appetite contributing to dehydration, and medications affected by worsening renal function.  Given IV fluids with improvement in blood pressures.  AKI likely in the setting of hypotension, dehydration.  Unclear if episode today syncope vs seizure, however given pt blood pressures and description of orthostatic changes may be syncope. Seizure precautions in place.   Admitted for further care.  Final Clinical Impressions(s) / ED Diagnoses   Final diagnoses:  Dehydration  Generalized weakness  AKI (acute kidney injury) ([place])  Hypotension due to hypovolemia    ED Discharge Orders    None       Gareth Morgan, MD 11/23/17 1146

## 2017-11-23 NOTE — Progress Notes (Signed)
Family Medicine Teaching Service Daily Progress Note Intern Pager: [phone]  Patient name: Jeffrey [name] Medical record number: [id number] Date of birth: 01-15-1966 Age: 52 y.o. Gender: male  Primary Care Provider: Sela Hilding, MD Consultants: None Code Status: Full  Pt Overview and Major Events to Date:  2/16: Admit to FMTS for DKA  Assessment and Plan: Jeffrey [name] is a 52 y.o. male presenting with weakness and lightheadedness. PMH is significant for seizure d/o, T2DM with neuropathy, HTN, alcohol abuse, OSA on CPAP, HLD, Anxiety, Depression, and hepatic cirrhosis.  DKA, mild: AG improved from 20 to 17. Lactic acid trended down from 2.9 > 2.2. Mentating at baseline. Last a1c 11.4 on 09/02/17. At home on lantus 20U qd with apidra 5U with meals or 3U with snacks. - Insulin gtt until anion gap closed x 2 - BMETs q4hrs while on drip - CBGs q1hr - NS until CBG < 250, then switch to D5 1/2 NS - Will give 2 runs of KCl now  AKI on ?CKD III: Improving with IVFs. Likely due to dehydration in the setting of DKA. Cr 3.70 > 3.11 (baseline 1.2). - FEUrea labs pending - monitor BMP closely - avoid nephrotoxic medications - hold home azilsartan, HCTZ, metformin - MIVFs per DKA protocol  EtOH abuse. Last drink was 11/20/17. CIWAs have been 0. - monitor on CIWA protocol - multivitamin, thiamine, folate  Prolonged QTC, with ST changes on EKG. No active chest pain. Initial EKG showing ST elevations in V1-V3 and ST depressions in II, III, AVF. QTc 511. Repeat EKG this morning unchanged from initial EKG. QTc improved from 511 > 498. - trend troponins - low threshold to consult cardiology if he develops chest pain - monitor on telemetry - avoid QTc prolonging medications  Hepatic cirrhosis. Patient noncompliant on home lactulose. No asterixis, confusion or jaundice on exam. - continue home lactulose  HLD. Chronic. - continue home lipitor  Tobacco abuse. Heavy user of  smokeless tobacco - nicotine patch prn  FEN/GI: heart healthy diet, protonix Prophylaxis: SQ heparin  Disposition: Will need to be stable on subq insulin prior to discharge. Anticipate discharge home in 1-2 days.  Subjective:  Doing well this morning. No concerns. Denies any nausea or vomiting. Endorses polyuria. No chest pain, palpitations, or shortness of breath.  Objective: Temp:  [97.6 F (36.4 C)-97.7 F (36.5 C)] 97.7 F (36.5 C) (02/17 0608) Pulse Rate:  [45-103] 83 (02/17 0700) Resp:  [0-31] 15 (02/17 0730) BP: (73-148)/(51-124) 114/85 (02/17 0730) SpO2:  [59 %-100 %] 100 % (02/17 0700) Weight:  [240 lb (108.9 kg)] 240 lb (108.9 kg) (02/16 1617) Physical Exam: General: laying in bed, in NAD, pleasant HEENT: EOMI, no scleral icterus Cardiovascular: RRR, no murmurs Respiratory: CTAB, normal WOB Gastrointestinal: +BS, soft, non-tender, non-distended MSK: warm and well-perfused, no edema  Laboratory: Recent Labs  Lab 11/22/17 1650 11/22/17 1849 11/22/17 1904 11/23/17 0646  WBC 4.3 13.5*  --  10.5  HGB 5.3* 14.9 17.7* 15.0  HCT 15.6* 43.0 52.0 44.7  PLT 86* 211  --  214   Recent Labs  Lab 11/22/17 1849 11/22/17 1904 11/22/17 2323 11/23/17 0646  NA 123* 123* 124* 131*  K 4.8 4.8 4.4 3.8  CL 82* 85* 85* 90*  CO2 22  --  19* 24  BUN 36* 42* 36* 34*  CREATININE 3.77* 3.50* 3.70* 3.11*  CALCIUM 9.0  --  8.9 8.9  PROT 8.6*  --   --   --   BILITOT 1.5*  --   --   --  ALKPHOS 165*  --   --   --   ALT 13*  --   --   --   AST 22  --   --   --   GLUCOSE 476* 488* 480* 373*   Trop 0.05 > 0.04 Lactic acid 2.53 > 2.9 > 2.2 A1c 10.8 UA: moderate Hgb, >500 glucose, 5 ketones UDS negative  Imaging/Diagnostic Tests: CXR- negative CT head- no acute intracranial abnormality  Mayo, Pete Pelt, MD 11/23/2017, 8:27 AM PGY-3, [place] Intern pager: [phone], text pages welcome

## 2017-11-23 NOTE — Progress Notes (Signed)
Pt refusing CPAP for the night. RT will continue to monitor as needed.  

## 2017-11-23 NOTE — ED Notes (Signed)
Pt. From home. Reports stop taking his medications for a prolonged time due to inability to afford them. Pt. Reports recently going back on them and had experienced seizures. Pt. A/O x4. NAD. Seizure pads in place.

## 2017-11-23 NOTE — ED Notes (Signed)
Meal Tray ordered.  

## 2017-11-23 NOTE — ED Notes (Addendum)
Test: Lactic Critical Value: 2.9  Name of Provider Notified:   Orders Received? Or Actions Taken?: MD paged.

## 2017-11-24 ENCOUNTER — Encounter (HOSPITAL_COMMUNITY): Payer: Self-pay

## 2017-11-24 DIAGNOSIS — E1010 Type 1 diabetes mellitus with ketoacidosis without coma: Secondary | ICD-10-CM

## 2017-11-24 LAB — BASIC METABOLIC PANEL
ANION GAP: 12 (ref 5–15)
Anion gap: 14 (ref 5–15)
Anion gap: 14 (ref 5–15)
Anion gap: 16 — ABNORMAL HIGH (ref 5–15)
BUN: 21 mg/dL — AB (ref 6–20)
BUN: 22 mg/dL — ABNORMAL HIGH (ref 6–20)
BUN: 27 mg/dL — AB (ref 6–20)
BUN: 21 mg/dL — ABNORMAL HIGH (ref 6–20)
CALCIUM: 8.9 mg/dL (ref 8.9–10.3)
CALCIUM: 9.1 mg/dL (ref 8.9–10.3)
CALCIUM: 8.8 mg/dL — AB (ref 8.9–10.3)
CO2: 19 mmol/L — ABNORMAL LOW (ref 22–32)
CO2: 22 mmol/L (ref 22–32)
CO2: 23 mmol/L (ref 22–32)
CO2: 23 mmol/L (ref 22–32)
Calcium: 9 mg/dL (ref 8.9–10.3)
Chloride: 100 mmol/L — ABNORMAL LOW (ref 101–111)
Chloride: 96 mmol/L — ABNORMAL LOW (ref 101–111)
Chloride: 96 mmol/L — ABNORMAL LOW (ref 101–111)
Chloride: 97 mmol/L — ABNORMAL LOW (ref 101–111)
Creatinine, Ser: 1.7 mg/dL — ABNORMAL HIGH (ref 0.61–1.24)
Creatinine, Ser: 1.79 mg/dL — ABNORMAL HIGH (ref 0.61–1.24)
Creatinine, Ser: 1.82 mg/dL — ABNORMAL HIGH (ref 0.61–1.24)
Creatinine, Ser: 2.19 mg/dL — ABNORMAL HIGH (ref 0.61–1.24)
GFR calc Af Amer: 38 mL/min — ABNORMAL LOW (ref >60)
GFR calc Af Amer: 47 mL/min — ABNORMAL LOW (ref >60)
GFR calc Af Amer: 48 mL/min — ABNORMAL LOW (ref >60)
GFR calc Af Amer: 51 mL/min — ABNORMAL LOW (ref >60)
GFR, EST NON AFRICAN AMERICAN: 41 mL/min — AB (ref >60)
GFR, EST NON AFRICAN AMERICAN: 42 mL/min — AB (ref >60)
GFR, EST NON AFRICAN AMERICAN: 33 mL/min — AB (ref >60)
GFR, EST NON AFRICAN AMERICAN: 44 mL/min — AB (ref >60)
GLUCOSE: 201 mg/dL — AB (ref 65–99)
GLUCOSE: 259 mg/dL — AB (ref 65–99)
GLUCOSE: 202 mg/dL — AB (ref 65–99)
GLUCOSE: 67 mg/dL (ref 65–99)
POTASSIUM: 3.9 mmol/L (ref 3.5–5.1)
POTASSIUM: 3.9 mmol/L (ref 3.5–5.1)
Potassium: 3.7 mmol/L (ref 3.5–5.1)
Potassium: 3.9 mmol/L (ref 3.5–5.1)
SODIUM: 135 mmol/L (ref 135–145)
Sodium: 132 mmol/L — ABNORMAL LOW (ref 135–145)
Sodium: 132 mmol/L — ABNORMAL LOW (ref 135–145)
Sodium: 133 mmol/L — ABNORMAL LOW (ref 135–145)

## 2017-11-24 LAB — CBC
HCT: 42.2 % (ref 39.0–52.0)
Hemoglobin: 14.1 g/dL (ref 13.0–17.0)
MCH: 28.8 pg (ref 26.0–34.0)
MCHC: 33.4 g/dL (ref 30.0–36.0)
MCV: 86.1 fL (ref 78.0–100.0)
Platelets: 253 10*3/uL (ref 150–400)
RBC: 4.9 MIL/uL (ref 4.22–5.81)
RDW: 13.7 % (ref 11.5–15.5)
WBC: 9.8 10*3/uL (ref 4.0–10.5)

## 2017-11-24 LAB — URINE CULTURE: Culture: NO GROWTH

## 2017-11-24 LAB — GLUCOSE, CAPILLARY
GLUCOSE-CAPILLARY: 113 mg/dL — AB (ref 65–99)
GLUCOSE-CAPILLARY: 125 mg/dL — AB (ref 65–99)
GLUCOSE-CAPILLARY: 226 mg/dL — AB (ref 65–99)
Glucose-Capillary: 146 mg/dL — ABNORMAL HIGH (ref 65–99)
Glucose-Capillary: 118 mg/dL — ABNORMAL HIGH (ref 65–99)
Glucose-Capillary: 128 mg/dL — ABNORMAL HIGH (ref 65–99)
Glucose-Capillary: 173 mg/dL — ABNORMAL HIGH (ref 65–99)
Glucose-Capillary: 204 mg/dL — ABNORMAL HIGH (ref 65–99)
Glucose-Capillary: 231 mg/dL — ABNORMAL HIGH (ref 65–99)

## 2017-11-24 LAB — UREA NITROGEN, URINE: Urea Nitrogen, Ur: 177 mg/dL

## 2017-11-24 MED ORDER — INSULIN GLARGINE 100 UNIT/ML ~~LOC~~ SOLN
16.0000 [IU] | SUBCUTANEOUS | Status: DC
  Administered 2017-11-24: 16 [IU] via SUBCUTANEOUS
  Filled 2017-11-24 (×2): qty 0.16

## 2017-11-24 MED ORDER — INSULIN ASPART 100 UNIT/ML ~~LOC~~ SOLN
0.0000 [IU] | Freq: Three times a day (TID) | SUBCUTANEOUS | Status: DC
  Administered 2017-11-25: 8 [IU] via SUBCUTANEOUS

## 2017-11-24 MED ORDER — DEXTROSE-NACL 5-0.45 % IV SOLN: INTRAVENOUS | Status: AC

## 2017-11-24 NOTE — Discharge Instructions (Signed)
You were admitted to the hospital due to Diabetic Ketoacidosis. It is a very serious and dangerous condition for which you were treated. Please be sure to keep your follow up appointments and take all medications as prescribed. It is important to take your insulin as prescribed and to check your blood sugar on a regular basis (at night and before meals).  Please avoid eating foods high in sugar or drinking sugary drinks like sodas and/or sweet tea, lemonade, carbonate fruit juices, etc.

## 2017-11-24 NOTE — Discharge Summary (Signed)
[facility] Discharge Summary  Patient name: Jeffrey [name] Medical record number: [id number] Date of birth: 29-Apr-1966 Age: 52 y.o. Gender: male Date of Admission: 11/22/2017  Date of Discharge: 11/25/2017 Admitting Physician: Alveda Reasons, MD  Primary Care Provider: Sela Hilding, MD Consultants: None  Indication for Hospitalization:   Diabetic Ketoacidosis  Discharge Diagnoses/Problem List:   Diabetic Ketoacidosis T2DM ETOH use disorder Tobacco use disorder Hepatic Cirrhosis  Disposition: home  Discharge Condition: medically stable  Discharge Exam:   Gen: Alert and Oriented x 3, NAD HEENT: Normocephalic, atraumatic, PERRLA, EOMI CV: RRR, no murmurs, normal S1, S2 split, +2 pulses dorsalis pedis bilaterally Resp: CTAB, no wheezing, rales, or rhonchi, comfortable work of breathing Abd: non-distended, non-tender, soft, +bs in all four quadrants MSK: FROM in all four extremities Ext: no clubbing, cyanosis, or edema Skin: warm, dry, intact, no rashes  Brief Hospital Course:  Jeffrey [name] is a 52y/o male who presented to the ED with  "feeling bad" for 1 week. He was found to be in DKA and he was started on IV fluids and an insulin drip. His Anion gap was found to be 20 on admission and overnight he closed his gap x 2 and was transitioned from the insulin gtt to subcutaneous Lantus. His mentation remained at baseline during his hospital admission and he was able to tolerate foods and liquids oral after coming of insulin gtt. He was transitioned to subcutaneous insulin and his gap remained closed.  He was found to have an AKI with a SCr of 3.7 on admission. ON discharge his serum creatnine was 1.33. His baseline is 1.2. It was suspected this was due to dehydration from DKA and his SCr improved with IVFs. His FEUrea was 14.4% consistent with pre-renal cause of his AKI.  Issues for Follow Up:  1. Jeffrey. Dantonio has an unclear cause of his  DKA and states he has been complaint in taking his insulin. Perhaps an adjustment of his daily regimen may help him be more compliant. I would refer him to Dr. Valentina Lucks for diabetic regimen adjustment.  2. I have held Jeffrey Sankey's ARB due to concern for AKI. I would recheck his Creatinine and then restart his ARB if he is back to baseline. 3. Jeffrey Borgen should be encouraged to take his lactulose as he was educated on the importance of taking this medication.  4. Jeffrey Dittus states he has not had his cholesterol checked in "over a year and a half". He would like this to be checked.  Significant Procedures:   None  Significant Labs and Imaging:  Recent Labs  Lab 11/22/17 1650 11/22/17 1849 11/22/17 1904 11/23/17 0646  WBC 4.3 13.5*  --  10.5  HGB 5.3* 14.9 17.7* 15.0  HCT 15.6* 43.0 52.0 44.7  PLT 86* 211  --  214   Recent Labs  Lab 11/22/17 1849  11/23/17 0646 11/23/17 1049 11/23/17 1641 11/23/17 1946 11/24/17 0003 11/24/17 0717  NA 123*   < > 131* 130* 133* 129* 132* 133*  K 4.8   < > 3.8 4.7 5.3* 3.9 3.7 3.9  CL 82*   < > 90* 92* 99* 95* 97* 96*  CO2 22   < > 24 22 20* 18* 19* 23  GLUCOSE 476*   < > 373* 331* 119* 154* 202* 201*  BUN 36*   < > 34* 34* 32* 32* 27* 22*  CREATININE 3.77*   < > 3.11* 2.74* 2.23* 2.23* 2.19* 1.82*  CALCIUM 9.0   < > 8.9 8.6* 8.4* 8.3* 8.8* 8.9  MG  --   --  2.5*  --   --   --   --   --   PHOS  --   --  4.1  --   --   --   --   --   ALKPHOS 165*  --   --   --   --   --   --   --   AST 22  --   --   --   --   --   --   --   ALT 13*  --   --   --   --   --   --   --   ALBUMIN 3.6  --   --   --   --   --   --   --    < > = values in this interval not displayed.   Mag: 2.5 Phos: 4.1 Trop 0.05 > 0.04 > 0.03 Lactic acid 2.53 > 2.9 > 2.2 A1c 10.8 UA: moderate Hgb, >500 glucose, 5 ketones UDS negative UCx: negative Ammonia 44 (high) Valproic Acid 21 (low) Urea Nitrogen 177 Urine Creatinine 101.77  Imaging/Diagnostic Tests: CXR- negative CT  head- no acute intracranial abnormality  Results/Tests Pending at Time of Discharge:   None  Discharge Medications:  Allergies as of 11/25/2017      Reactions   Lisinopril Other (See Comments)   Pt reports nose bleed.      Medication List    STOP taking these medications   Azilsartan Medoxomil 40 MG Tabs   naproxen 500 MG tablet Commonly known as:  NAPROSYN     TAKE these medications   aspirin EC 81 MG tablet Take 81 mg by mouth at bedtime.   atorvastatin 80 MG tablet Commonly known as:  LIPITOR Take 1 tablet (80 mg total) by mouth daily. What changed:  when to take this   divalproex 500 MG 24 hr tablet Commonly known as:  DEPAKOTE ER Take 1 tablet (500 mg total) by mouth 2 (two) times daily. What changed:  when to take this   feeding supplement (GLUCERNA SHAKE) Liqd Take 237 mLs by mouth 2 (two) times daily between meals. What changed:    when to take this  reasons to take this   folic acid 1 MG tablet Commonly known as:  FOLVITE Take 1 tablet (1 mg total) by mouth daily. What changed:  when to take this   gabapentin 300 MG capsule Commonly known as:  NEURONTIN Take 1 capsule (300 mg total) by mouth 4 (four) times daily. What changed:    how much to take  when to take this  additional instructions   hydrochlorothiazide 25 MG tablet Commonly known as:  HYDRODIURIL Take 25 mg by mouth at bedtime.   insulin glargine 100 unit/mL Sopn Commonly known as:  LANTUS Inject 0.15 mLs (15 Units total) into the skin daily before breakfast. What changed:  how much to take   Insulin Glulisine 100 UNIT/ML Solostar Pen Commonly known as:  APIDRA SOLOSTAR Take 2 units two times a day with your Glucerna as long as your blood sugar is higher than 200. Take 5 units with your evening meal as long as your blood sugar is higher than 100.   lactulose 10 GM/15ML solution Commonly known as:  CHRONULAC Take 15 mLs (10 g total) by mouth daily. What changed:    when to  take this  reasons to take this   levETIRAcetam 500 MG tablet Commonly known as:  KEPPRA Take 1 tablet (500 mg total) by mouth 2 (two) times daily.   metFORMIN 500 MG tablet Commonly known as:  GLUCOPHAGE Take 1 tablet (500 mg total) by mouth 2 (two) times daily with a meal.   multivitamin with minerals Tabs tablet Take 1 tablet by mouth at bedtime.   NOVOLOG FLEXPEN 100 UNIT/ML FlexPen Generic drug:  insulin aspart Inject 3-5 Units into the skin See admin instructions. Inject 3 units subcutaneously up to six time daily with each small meal, inject 5 units with large meals   omeprazole 20 MG capsule Commonly known as:  PRILOSEC Take 20 mg by mouth at bedtime.   polyvinyl alcohol 1.4 % ophthalmic solution Commonly known as:  LIQUIFILM TEARS Place 1 drop into both eyes every 4 (four) hours as needed for dry eyes.   sildenafil 100 MG tablet Commonly known as:  VIAGRA Take 100 mg by mouth daily as needed for erectile dysfunction.   thiamine 100 MG tablet Commonly known as:  VITAMIN B-1 Take 1 tablet (100 mg total) by mouth daily.       Discharge Instructions: Please refer to Patient Instructions section of EMR for full details.  Patient was counseled important signs and symptoms that should prompt return to medical care, changes in medications, dietary instructions, activity restrictions, and follow up appointments.   Follow-Up Appointments:   Nuala Alpha, DO 11/24/2017, 9:40 AM PGY-1, [place]

## 2017-11-24 NOTE — Progress Notes (Signed)
Family Medicine Teaching Service Daily Progress Note Intern Pager: [phone]  Patient name: Jeffrey [name] Medical record number: [id number] Date of birth: 01-12-1966 Age: 52 y.o. Gender: male  Primary Care Provider: Sela Hilding, MD Consultants: None Code Status: Full  Pt Overview and Major Events to Date:  2/16: Admit to FMTS for DKA  Assessment and Plan: Jeffrey [name] is a 52 y.o. male presenting with weakness and lightheadedness. PMH is significant for seizure d/o, T2DM with neuropathy, HTN, alcohol abuse, OSA on CPAP, HLD, Anxiety, Depression, and hepatic cirrhosis.  DKA, mild: AG improved from 20 to 17 now to 15 x 1. Lactic acid trended down from 2.9 > 2.2. Mentating at baseline. Last a1c 11.4 on 09/02/17. At home on lantus 20U qd with apidra 5U with meals or 3U with snacks. - Cont Insulin gtt until anion gap closed x 2; anticipate will be able to transition this afternoon - BMETs q4hrs while on drip - CBGs q1hr - Cont D5 1/2 NS  AKI: Continues to improve with IVFs. Likely due to dehydration in the setting of DKA. Cr 3.70 > 3.11 > 2.19 > 1.82 (baseline 1.2). - FEUrea is 14.4% suggests pre-renal disease - monitor BMP closely - avoid nephrotoxic medications - hold home azilsartan, HCTZ, metformin - MIVFs per DKA protocol  EtOH abuse. Last drink was 11/20/17. CIWAs remain 0. - monitor on CIWA protocol - multivitamin, thiamine, folate  Prolonged QTC, with ST changes on EKG. No active chest pain this am (2/18). Initial EKG showing ST elevations in V1-V3 and ST depressions in II, III, AVF. QTc 511. Repeat EKG unchanged from initial EKG. QTc improved from 511 > 498. - trend troponins -> down trending with last on <0.03 with no chest pain - low threshold to consult cardiology if he develops chest pain - monitor on telemetry - cont to avoid QTc prolonging medications  Hepatic cirrhosis. Patient noncompliant on home lactulose. No asterixis, confusion or jaundice on  exam. - continue home lactulose  HLD. Chronic. - continue home lipitor  Tobacco abuse. Heavy user of smokeless tobacco - nicotine patch prn  FEN/GI: heart healthy diet, protonix Prophylaxis: SQ heparin  Disposition: Will need to be stable on subq insulin prior to discharge. Anticipate discharge home in 1-2 days.  Subjective:  Patient states he is doing well. His mentation is normal and he is able to answer questions appropriately. He denies chest pain this morning and overall has no complaints. He is eating well and is resting comfortably in bed.  He also denies SOB, abdominal pain, nausea, and vomiting.  Objective: Temp:  [97.5 F (36.4 C)-97.9 F (36.6 C)] 97.5 F (36.4 C) (02/17 2343) Pulse Rate:  [77-86] 79 (02/17 2343) Resp:  [11-31] 13 (02/17 2343) BP: (98-127)/(63-100) 111/83 (02/17 2343) SpO2:  [100 %] 100 % (02/17 2343) Physical Exam: Gen: Alert and Oriented x 3, NAD HEENT: Normocephalic, atraumatic, PERRLA, EOMI CV: RRR, no murmurs, normal S1, S2 split, +2 pulses dorsalis pedis bilaterally Resp: CTAB, no wheezing, rales, or rhonchi, comfortable work of breathing Abd: non-distended, non-tender, soft, +bs in all four quadrants Ext: no clubbing, cyanosis, or edema Skin: warm, dry, intact, no rashes  Laboratory: Recent Labs  Lab 11/22/17 1650 11/22/17 1849 11/22/17 1904 11/23/17 0646  WBC 4.3 13.5*  --  10.5  HGB 5.3* 14.9 17.7* 15.0  HCT 15.6* 43.0 52.0 44.7  PLT 86* 211  --  214   Recent Labs  Lab 11/22/17 1849  11/23/17 1946 11/24/17 0003 11/24/17 3646  NA 123*   < > 129* 132* 133*  K 4.8   < > 3.9 3.7 3.9  CL 82*   < > 95* 97* 96*  CO2 22   < > 18* 19* 23  BUN 36*   < > 32* 27* 22*  CREATININE 3.77*   < > 2.23* 2.19* 1.82*  CALCIUM 9.0   < > 8.3* 8.8* 8.9  PROT 8.6*  --   --   --   --   BILITOT 1.5*  --   --   --   --   ALKPHOS 165*  --   --   --   --   ALT 13*  --   --   --   --   AST 22  --   --   --   --   GLUCOSE 476*   < > 154* 202*  201*   < > = values in this interval not displayed.   Mag: 2.5 Phos: 4.1 Trop 0.05 > 0.04 > 0.03 Lactic acid 2.53 > 2.9 > 2.2 A1c 10.8 UA: moderate Hgb, >500 glucose, 5 ketones UDS negative UCx: negative Ammonia 44 (high) Valproic Acid 21 (low) Urea Nitrogen 177 Urine Creatinine 101.77  Imaging/Diagnostic Tests: CXR- negative CT head- no acute intracranial abnormality  Nuala Alpha, DO 11/24/2017, 6:41 AM PGY-3, [place] Intern pager: [phone], text pages welcome

## 2017-11-24 NOTE — Progress Notes (Signed)
Nutrition Brief Note  Patient identified on the Malnutrition Screening Tool (MST) Report  Wt Readings from Last 15 Encounters:  11/22/17 240 lb (108.9 kg)  10/23/17 241 lb 12.8 oz (109.7 kg)  10/03/17 251 lb 12.8 oz (114.2 kg)  09/22/17 243 lb 12.8 oz (110.6 kg)  09/02/17 239 lb 3.2 oz (108.5 kg)  08/03/17 228 lb (103.4 kg)  07/29/17 231 lb (104.8 kg)  07/11/17 236 lb 12.8 oz (107.4 kg)  07/03/17 235 lb 3.2 oz (106.7 kg)  06/26/17 235 lb 12.8 oz (107 kg)  06/16/17 244 lb 12.8 oz (111 kg)  06/10/17 258 lb 2.5 oz (117.1 kg)  05/08/17 258 lb 9.6 oz (117.3 kg)  05/02/17 241 lb 6.4 oz (109.5 kg)  04/08/17 226 lb (102.5 kg)   Jeffrey [name] is a 52 y.o. male presenting with weakness and lightheadedness. PMH is significant for seizure d/o, T2DM with neuropathy, HTN, alcohol abuse, OSA on CPAP, HLD, Anxiety, Depression, and hepatic cirrhosis.  Pt admitted with DKA.   Pt sleeping soundly at time of visit. Pt was just advanced to carb modified diet this afternoon.   Pt with no signs of fat and muscle depletion.   Case discussed with DM coordinator. PTA DM medications are 500 mg metformin BID, 2 units insulin glulisine BID if blood sugar is greater than 200 and 5 units with evening meal is blood sugar is greater than 100, 3-5 units insulin aspart up to 6 times per day and 20-30 units insulin aspart q AM. Last Hgb A1c: 10.8, which has improved since last admission.   Body mass index is 34.44 kg/m. Patient meets criteria for obesity, class I based on current BMI.   Current diet order is Carb Modified, patient is consuming approximately n/a% of meals at this time. Labs and medications reviewed.   No nutrition interventions warranted at this time. If nutrition issues arise, please consult RD.   [name], RD, LDN, CDE Pager: [phone] After hours Pager: [phone]

## 2017-11-24 NOTE — Progress Notes (Signed)
Blood glucose on BMP was 67 this is down from 200s after being on insulin drip. Stat CBG placed. Checked on patient and he showed no signs of hypoglycemia.  Will continue to monitor.

## 2017-11-24 NOTE — Progress Notes (Signed)
PT Cancellation Note/DC Note  Patient Details Name: Jeffrey [name] MRN: [id number] DOB: [birth year]   Cancelled Treatment:    Reason Eval/Treat Not Completed: Patient declined, no reason specified. Pt again refused. Amb this AM with OT without difficulty and was supposedly interested in strengthening ex's. Have attempted to see twice today with pt refusing and not demonstrating any interest in PT. Will sign off.   [place] 11/24/2017, 3:01 PM  [place]

## 2017-11-24 NOTE — Evaluation (Signed)
Occupational Therapy Evaluation and Discharge Patient Details Name: Jeffrey [name] MRN: [id number] DOB: June 04, 1966 Today's Date: 11/24/2017    History of Present Illness 52yo male with history of type II DM, htn, OSA, seizures, cardiac arrest with anoxic encephalopathy, ETOH presents with concern for generalized weakness in setting of recently restarting several of his medications. Found to have AKI   Clinical Impression   This 52 yo male admitted with above presents to acute OT at a S to Mod I level and has this S at home. No further OT needs, we will sign off.    Follow Up Recommendations  No OT follow up    Equipment Recommendations  None recommended by OT       Precautions / Restrictions Precautions Precautions: Fall Restrictions Weight Bearing Restrictions: No      Mobility Bed Mobility Overal bed mobility: Independent                Transfers Overall transfer level: Modified independent Equipment used: None             General transfer comment: Pt ambulated around the unit with S and no AD (walks very stiffed legged)    Balance Overall balance assessment: History of Falls                                         ADL either performed or assessed with clinical judgement   ADL Overall ADL's : Modified independent                                             Vision Baseline Vision/History: Wears glasses;Cataracts Wears Glasses: At all times Patient Visual Report: No change from baseline              Pertinent Vitals/Pain Pain Assessment: No/denies pain     Hand Dominance Right   Extremity/Trunk Assessment Upper Extremity Assessment Upper Extremity Assessment: Overall WFL for tasks assessed           Communication Communication Communication: No difficulties   Cognition Arousal/Alertness: Awake/alert Behavior During Therapy: WFL for tasks assessed/performed Overall Cognitive Status: Within  Functional Limits for tasks assessed                                                Home Living Family/patient expects to be discharged to:: Private residence Living Arrangements: Spouse/significant other;Children Available Help at Discharge: Family;Available 24 hours/day Type of Home: House Home Access: Stairs to enter [employer] of Steps: 3 Entrance Stairs-Rails: None Home Layout: One level     Bathroom Shower/Tub: Teacher, early years/pre: Standard     Home Equipment: Cane - single point   Additional Comments: Pt reports falling multiple times in the bathroom (from last admisson 3 months ago)      Prior Functioning/Environment          Comments: uses [facility] sometimes, wife drives as pt cannot due to seizures; 3 grown children (20s-30s) live with them due to disabilties        OT Problem List: Impaired balance (sitting and/or standing)(h/o falls)         OT  Goals(Current goals can be found in the care plan section) Acute Rehab OT Goals Patient Stated Goal: to get stronger ("I want to be able to get up off the floor without having to pull up on something or someone")  OT Frequency:                AM-PAC PT "6 Clicks" Daily Activity     Outcome Measure Help from another person eating meals?: None Help from another person taking care of personal grooming?: None Help from another person toileting, which includes using toliet, bedpan, or urinal?: None Help from another person bathing (including washing, rinsing, drying)?: None Help from another person to put on and taking off regular upper body clothing?: None Help from another person to put on and taking off regular lower body clothing?: None 6 Click Score: 24   End of Session Equipment Utilized During Treatment: Gait belt Nurse Communication: (Ok'd for pt to staying remaining sitting EOB when I leave the room)  Activity Tolerance: Patient tolerated treatment well Patient  left: (sitting EOB)  OT Visit Diagnosis: Other abnormalities of gait and mobility (R26.89)                Time: 9390-3009 OT Time Calculation (min): 21 min Charges:  OT General Charges $OT Visit: 1 Visit OT Evaluation $OT Eval Moderate Complexity: [address] [name], [place] [phone] 11/24/2017

## 2017-11-24 NOTE — Care Management (Signed)
Per Family Medicine Pharm note; pt has been accepted into the [facility] program and has been approved through Dec 2019.  CM will continue to follow for discharge needs

## 2017-11-24 NOTE — Progress Notes (Signed)
Pts Blood Glucose level is 205.

## 2017-11-24 NOTE — Progress Notes (Signed)
PT Cancellation Note  Patient Details Name: Jeffrey [name] MRN: [id number] DOB: 28-Apr-1966   Cancelled Treatment:    Reason Eval/Treat Not Completed: Other (comment). Pt stated he thought I was coming in the afternoon and wanted to see me later. I will come back later and bring pt a strengthening program for home.   Shary Decamp [place] 11/24/2017, 11:27 AM Suanne Marker PT [phone]

## 2017-11-25 DIAGNOSIS — E861 Hypovolemia: Secondary | ICD-10-CM

## 2017-11-25 LAB — BASIC METABOLIC PANEL
Anion gap: 14 (ref 5–15)
BUN: 19 mg/dL (ref 6–20)
CALCIUM: 9 mg/dL (ref 8.9–10.3)
CO2: 22 mmol/L (ref 22–32)
Chloride: 97 mmol/L — ABNORMAL LOW (ref 101–111)
Creatinine, Ser: 1.7 mg/dL — ABNORMAL HIGH (ref 0.61–1.24)
GFR calc Af Amer: 51 mL/min — ABNORMAL LOW (ref >60)
GFR, EST NON AFRICAN AMERICAN: 44 mL/min — AB (ref >60)
GLUCOSE: 343 mg/dL — AB (ref 65–99)
Potassium: 4.1 mmol/L (ref 3.5–5.1)
Sodium: 133 mmol/L — ABNORMAL LOW (ref 135–145)

## 2017-11-25 LAB — GLUCOSE, CAPILLARY
GLUCOSE-CAPILLARY: 163 mg/dL — AB (ref 65–99)
GLUCOSE-CAPILLARY: 120 mg/dL — AB (ref 65–99)
GLUCOSE-CAPILLARY: 73 mg/dL (ref 65–99)
Glucose-Capillary: 205 mg/dL — ABNORMAL HIGH (ref 65–99)
Glucose-Capillary: 221 mg/dL — ABNORMAL HIGH (ref 65–99)
Glucose-Capillary: 245 mg/dL — ABNORMAL HIGH (ref 65–99)
Glucose-Capillary: 249 mg/dL — ABNORMAL HIGH (ref 65–99)
Glucose-Capillary: 266 mg/dL — ABNORMAL HIGH (ref 65–99)
Glucose-Capillary: 324 mg/dL — ABNORMAL HIGH (ref 65–99)

## 2017-11-25 LAB — CULTURE, BLOOD (ROUTINE X 2): Special Requests: ADEQUATE

## 2017-11-25 LAB — CBC
HCT: 39.8 % (ref 39.0–52.0)
Hemoglobin: 13.2 g/dL (ref 13.0–17.0)
MCH: 28.6 pg (ref 26.0–34.0)
MCHC: 33.2 g/dL (ref 30.0–36.0)
MCV: 86.3 fL (ref 78.0–100.0)
PLATELETS: 226 10*3/uL (ref 150–400)
RBC: 4.61 MIL/uL (ref 4.22–5.81)
RDW: 13.5 % (ref 11.5–15.5)
WBC: 9 10*3/uL (ref 4.0–10.5)

## 2017-11-25 LAB — MRSA PCR SCREENING: MRSA by PCR: NEGATIVE

## 2017-11-25 MED ORDER — POLYETHYLENE GLYCOL 3350 17 G PO PACK
17.0000 g | PACK | Freq: Every day | ORAL | Status: DC
  Administered 2017-11-25: 17 g via ORAL
  Filled 2017-11-25: qty 1

## 2017-11-25 MED ORDER — POLYETHYLENE GLYCOL 3350 17 G PO PACK: 17.0000 g | PACK | Freq: Every day | ORAL | 0 refills | Status: DC

## 2017-11-25 MED ORDER — SENNOSIDES-DOCUSATE SODIUM 8.6-50 MG PO TABS
1.0000 | ORAL_TABLET | Freq: Once | ORAL | Status: AC
  Administered 2017-11-25: 1 via ORAL
  Filled 2017-11-25: qty 1

## 2017-11-25 MED ORDER — SENNOSIDES-DOCUSATE SODIUM 8.6-50 MG PO TABS: 1.0000 | ORAL_TABLET | Freq: Once | ORAL | 0 refills | Status: AC

## 2017-11-25 NOTE — Care Management Note (Signed)
Case Management Note  Patient Details  Name: Jeffrey [name] MRN: [id number] Date of Birth: 1965-11-06  Subjective/Objective:    Pt admitted with weakness                Action/Plan:  PTA independent from home with wife.  Pt active with Family Medicine Program for PCP.  Pt is also approved with [facility] and therefore his medications are free   Expected Discharge Date:  11/25/17               Expected Discharge Plan:  Home/Self Care  In-House Referral:     Discharge planning Services  CM Consult  Post Acute Care Choice:    Choice offered to:     DME Arranged:    DME Agency:     HH Arranged:    [place] Agency:     Status of Service:  Completed, signed off  If discussed at [employer] of Stay Meetings, dates discussed:    Additional Comments:  Maryclare Labrador, RN 11/25/2017, 11:43 AM

## 2017-11-25 NOTE — Progress Notes (Signed)
IV's removed; wife called; belongings collected; educated on d/c instructions; SWOT will take pt to d/c lounge to wait for ride.   [place]  Jeffrey Demetriou, RN

## 2017-11-25 NOTE — Progress Notes (Signed)
Inpatient Diabetes Program Recommendations  AACE/ADA: New Consensus Statement on Inpatient Glycemic Control (2015)  Target Ranges:  Prepandial:   less than 140 mg/dL      Peak postprandial:   less than 180 mg/dL (1-2 hours)      Critically ill patients:  140 - 180 mg/dL   Lab Results  Component Value Date   GLUCAP 266 (H) 11/25/2017   HGBA1C 10.8 (H) 11/23/2017    Review of Glycemic Control  Diabetes history: DM2 Outpatient Diabetes medications: Lantus 20 or 30 units qd + Novolog meal coverage 3 units for small meals and 5 units for large meals Current orders for Inpatient glycemic control: Lantus 16 units + Novolog moderate correction tid  Inpatient Diabetes Program Recommendations:   -Decrease Novolog correction to sensitive -Increase Lantus to 20 units -Add Novolog 3 units tid meal coverage if eats 50%  Spoke with pt about A1C results 10.8 (decreased from 11.4 on 09/02/17) with them and explained what an A1C is, basic pathophysiology of DM Type 2, basic home care, basic diabetes diet nutrition principles, importance of checking CBGs and maintaining good CBG control to prevent long-term and short-term complications. Reviewed signs and symptoms of hyperglycemia and hypoglycemia and how to treat hypoglycemia at home. Also reviewed blood sugar goals at home.  Patient states he rotates his Novolog insulin injections from right to left in his abdomen using the same site each time. Explained rotation on injection sites with patient to prevent scar tissue and improve absorption of insulin. Patient uses his arm for Lantus injections. Reviewed other sick day rules with patient. Patient did not call his physician office when his blood glucose was high due to not getting return phone calls in the past. Recommended patient to call MD office and followup with log of CBGs on visits.  Thank you, Nani Gasser. Sohan Potvin, RN, MSN, CDE  Diabetes Coordinator Inpatient Glycemic Control Team Team Pager  [phone] (8am-5pm) 11/25/2017 9:55 AM

## 2017-11-25 NOTE — Progress Notes (Signed)
Pt refused short & long-acting insulin. Michela Pitcher he will give it to himself when he gets home. Wife here. SWOT wheeling pt out now.   [place]  Melvia Matousek, RN

## 2017-11-25 NOTE — Progress Notes (Signed)
Pts Blood Glucose is 324

## 2017-11-25 NOTE — Progress Notes (Signed)
Pt's wife is ride; he is contacting her now to find out when she can pick him up.   [place]  Zaylei Mullane, RN

## 2017-11-25 NOTE — Progress Notes (Signed)
Family Medicine Teaching Service Daily Progress Note Intern Pager: [phone]  Patient name: Jeffrey [name] Medical record number: [id number] Date of birth: 01-09-1966 Age: 52 y.o. Gender: male  Primary Care Provider: Sela Hilding, MD Consultants: None Code Status: Full  Pt Overview and Major Events to Date:  2/16: Admit to FMTS for DKA  Assessment and Plan: Jeffrey [name] is a 52 y.o. male presenting with weakness and lightheadedness. PMH is significant for seizure d/o, T2DM with neuropathy, HTN, alcohol abuse, OSA on CPAP, HLD, Anxiety, Depression, and hepatic cirrhosis.  DKA 2/2 T2DM: Acute. Resolved. Patient has transitioned to subq insulin. BG range off the drip dropped to 67 with no symptoms. Up above 300 this am (2/19).  Mentating at baseline. Last a1c 11.4 on 09/02/17. - On Lantus 16U with Novolog for correction at meals; adjust Lantus dose by 5U  - daily BMP - CBGs before meals and at night  AKI: Improving. Continues to improve with good oral hydration. Likely due to dehydration in the setting of DKA. Cr 1.70 this am. (baseline 1.2). - monitor BMP closely - avoid nephrotoxic medications - cont to hold home azilsartan, HCTZ, metformin  EtOH abuse. Last drink was 11/20/17. CIWAs remain 0 for past 48hrs. - monitor on CIWA protocol - multivitamin, thiamine, folate  Prolonged QTC, with ST changes on EKG. No active chest pain this am (2/19). Initial EKG showing ST elevations in V1-V3 and ST depressions in II, III, AVF. QTc 511. Repeat EKG unchanged from initial EKG. QTc improved from 511 > 498. - low threshold to consult cardiology if he develops chest pain - monitor on telemetry - cont to avoid QTc prolonging medications  Hepatic cirrhosis. Patient noncompliant on home lactulose. No asterixis, confusion or jaundice on exam. - continue home lactulose  HLD. Chronic. - continue home lipitor  Tobacco abuse. Heavy user of smokeless tobacco - nicotine patch  prn  FEN/GI: heart healthy diet, protonix Prophylaxis: SQ heparin  Disposition: Will need to be stable on subq insulin prior to discharge. Anticipate discharge home in 1-2 days.  Subjective:  Patient states he is doing well with no complaints. He denies chest pain, SOB, nausea, vomiting, abdominal pain this morning. He has had no more dizziness or any confusion. He is somewhat concerned his strength is not back to baseline but reassured with patient that PT will come and see him today.   Objective: Temp:  [97.3 F (36.3 C)-98 F (36.7 C)] 98 F (36.7 C) (02/19 0401) Pulse Rate:  [80-99] 91 (02/19 0401) Resp:  [11-18] 13 (02/19 0401) BP: (90-123)/(62-84) 123/78 (02/19 0401) SpO2:  [99 %-100 %] 100 % (02/19 0401) Weight:  [218 lb 14.4 oz (99.3 kg)] 218 lb 14.4 oz (99.3 kg) (02/19 0553) Physical Exam: Gen: Alert and Oriented x 3, NAD HEENT: Normocephalic, atraumatic, PERRLA, EOMI CV: RRR, no murmurs, normal S1, S2 split, +2 pulses dorsalis pedis bilaterally Resp: CTAB, no wheezing, rales, or rhonchi, comfortable work of breathing Abd: non-distended, non-tender, soft, +bs in all four quadrants MSK: FROM in all four extremities Ext: no clubbing, cyanosis, or edema Skin: warm, dry, intact, no rashes  Laboratory: Recent Labs  Lab 11/23/17 0646 11/24/17 1041 11/25/17 0244  WBC 10.5 9.8 9.0  HGB 15.0 14.1 13.2  HCT 44.7 42.2 39.8  PLT 214 253 226   Recent Labs  Lab 11/22/17 1849  11/24/17 1041 11/24/17 1630 11/25/17 0244  NA 123*   < > 132* 135 133*  K 4.8   < > 3.9 3.9  4.1  CL 82*   < > 96* 100* 97*  CO2 22   < > 22 23 22   BUN 36*   < > 21* 21* 19  CREATININE 3.77*   < > 1.79* 1.70* 1.70*  CALCIUM 9.0   < > 9.1 9.0 9.0  PROT 8.6*  --   --   --   --   BILITOT 1.5*  --   --   --   --   ALKPHOS 165*  --   --   --   --   ALT 13*  --   --   --   --   AST 22  --   --   --   --   GLUCOSE 476*   < > 259* 67 343*   < > = values in this interval not displayed.   Mag:  2.5 Phos: 4.1 Trop 0.05 > 0.04 > 0.03 Lactic acid 2.53 > 2.9 > 2.2 A1c 10.8 UA: moderate Hgb, >500 glucose, 5 ketones UDS negative UCx: negative Ammonia 44 (high) Valproic Acid 21 (low) Urea Nitrogen 177 Urine Creatinine 101.77  Imaging/Diagnostic Tests: CXR- negative CT head- no acute intracranial abnormality  Nuala Alpha, DO 11/25/2017, 6:49 AM PGY-1, [place] Intern pager: [phone], text pages welcome

## 2017-11-27 ENCOUNTER — Ambulatory Visit: Payer: Medicaid Other

## 2017-11-27 LAB — CULTURE, BLOOD (ROUTINE X 2): CULTURE: NO GROWTH

## 2017-11-28 ENCOUNTER — Inpatient Hospital Stay: Payer: Medicaid Other | Admitting: Family Medicine

## 2017-11-28 NOTE — Progress Notes (Deleted)
Subjective:    Patient ID: Jeffrey [name] , male   DOB: 09-30-1966 , 52 y.o..   MRN: [id number]  HPI  Jeffrey [name] is a 52 yo M with PMH of seizure d/o,T2DMwith neuropathy, HTN, alcohol abuse, OSA on CPAP, HLD, Anxiety, Depression, and hepatic cirrhosis   here for No chief complaint on file.   1. ***  Review of Systems: Per HPI  Past Medical History: Patient Active Problem List   Diagnosis Date Noted  . Generalized weakness   . Orthostatic syncope 09/02/2017  . Sepsis ([place])   . Hypotension 07/27/2017  . Speech disturbance 07/03/2017  . DKA (diabetic ketoacidoses) ([place]) 06/09/2017  . Hyponatremia 04/08/2017  . Seizure ([place]) 04/08/2017  . Depression 12/17/2015  . Hip pain 12/17/2015  . Proteinuria 11/23/2015  . Loss of teeth 11/23/2015  . Essential hypertension   . Altered mental status 09/12/2015  . Diabetic polyneuropathy associated with type 2 diabetes mellitus ([place]) 08/04/2015  . Hepatic cirrhosis ([place]) 03/22/2015  . Ataxia 03/20/2015  . Aphasia   . Elevated liver enzymes   . Dysarthria 03/09/2015  . AKI (acute kidney injury) ([place])   . Alcohol intoxication ([place])   . ETOH abuse   . Erosive esophagitis 10/10/2014  . HLD (hyperlipidemia)   . Passed out [facility]) 10/08/2014  . Hyperglycemia   . Syncope and collapse   . Dehydration 05/18/2014  . OSA on CPAP 12/28/2012  . Pulmonary edema 12/08/2012  . Cardiac arrest ([place]) 12/07/2012  . Obesity 12/07/2012  . Anoxic brain injury ([place]) 12/07/2012  . GI bleed 11/26/2012  . Venous insufficiency 12/13/2011  . 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 ALCOHOL ABUSE, HX OF 11/10/2007  . ERECTILE DYSFUNCTION 04/14/2007  . Type 2 diabetes mellitus with diabetic neuropathy, with long-term current use of insulin ([place]) 12/04/2006  . HYPERCHOLESTEROLEMIA 12/04/2006  . OBESITY, NOS 12/04/2006  . Anxiety state 12/04/2006  . HYPERTENSION, BENIGN SYSTEMIC 12/04/2006  . ARTHRITIS 12/04/2006     Medications: reviewed and updated Current Outpatient Medications  Medication Sig Dispense Refill  . aspirin EC 81 MG tablet Take 81 mg by mouth at bedtime.     [name] atorvastatin (LIPITOR) 80 MG tablet Take 1 tablet (80 mg total) by mouth daily. (Patient taking differently: Take 80 mg by mouth at bedtime. ) 30 tablet 3  . divalproex (DEPAKOTE ER) 500 MG 24 hr tablet Take 1 tablet (500 mg total) by mouth 2 (two) times daily. (Patient taking differently: Take 500 mg by mouth at bedtime. ) 180 tablet 0  . feeding supplement, GLUCERNA SHAKE, (GLUCERNA SHAKE) LIQD Take 237 mLs by mouth 2 (two) times daily between meals. (Patient taking differently: Take 237 mLs by mouth 2 (two) times daily as needed (meal replacement). ) 893 mL 14  . folic acid (FOLVITE) 1 MG tablet Take 1 tablet (1 mg total) by mouth daily. (Patient taking differently: Take 1 mg by mouth at bedtime. ) 30 tablet 12  . gabapentin (NEURONTIN) 300 MG capsule Take 1 capsule (300 mg total) by mouth 4 (four) times daily. (Patient taking differently: Take 300-600 mg by mouth See admin instructions. Take one capsule (300 mg) by mouth every morning and 3 capsules (900 mg) at night) 360 capsule 0  . hydrochlorothiazide (HYDRODIURIL) 25 MG tablet Take 25 mg by mouth at bedtime.     . insulin aspart (NOVOLOG FLEXPEN) 100 UNIT/ML FlexPen Inject 3-5 Units into the skin See admin instructions. Inject 3 units subcutaneously up to  six time daily with each small meal, inject 5 units with large meals    . insulin glargine (LANTUS) 100 unit/mL SOPN Inject 0.15 mLs (15 Units total) into the skin daily before breakfast. (Patient taking differently: Inject 20-30 Units into the skin daily before breakfast. ) 15 mL 1  . Insulin Glulisine (APIDRA SOLOSTAR) 100 UNIT/ML Solostar Pen Take 2 units two times a day with your Glucerna as long as your blood sugar is higher than 200. Take 5 units with your evening meal as long as your blood sugar is higher than 100. (Patient  not taking: Reported on 11/22/2017) 15 mL 3  . lactulose (CHRONULAC) 10 GM/15ML solution Take 15 mLs (10 g total) by mouth daily. (Patient taking differently: Take 10 g by mouth daily as needed (constipation). ) 473 mL 3  . levETIRAcetam (KEPPRA) 500 MG tablet Take 1 tablet (500 mg total) by mouth 2 (two) times daily. 60 tablet 2  . metFORMIN (GLUCOPHAGE) 500 MG tablet Take 1 tablet (500 mg total) by mouth 2 (two) times daily with a meal. 180 tablet 3  . Multiple Vitamin (MULTIVITAMIN WITH MINERALS) TABS tablet Take 1 tablet by mouth at bedtime.     [name] omeprazole (PRILOSEC) 20 MG capsule Take 20 mg by mouth at bedtime.    . polyethylene glycol (MIRALAX / GLYCOLAX) packet Take 17 g by mouth daily. 14 each 0  . polyvinyl alcohol (LIQUIFILM TEARS) 1.4 % ophthalmic solution Place 1 drop into both eyes every 4 (four) hours as needed for dry eyes. 15 mL 2  . sildenafil (VIAGRA) 100 MG tablet Take 100 mg by mouth daily as needed for erectile dysfunction.    . thiamine (VITAMIN B-1) 100 MG tablet Take 1 tablet (100 mg total) by mouth daily. (Patient not taking: Reported on 11/22/2017) 30 tablet 12   No current facility-administered medications for this visit.     Social Hx:  reports that  has never smoked. His smokeless tobacco use includes snuff.   Objective:   There were no vitals taken for this visit. Physical Exam  Gen: NAD, alert, cooperative with exam, well-appearing HEENT: NCAT, PERRL, clear conjunctiva, oropharynx clear, supple neck Cardiac: Regular rate and rhythm, normal S1/S2, no murmur, no edema, capillary refill brisk  Respiratory: Clear to auscultation bilaterally, no wheezes, non-labored breathing Gastrointestinal: soft, non tender, non distended, bowel sounds present Skin: no rashes, normal turgor  Neurological: no gross deficits.  Psych: good insight, normal mood and affect  Assessment & Plan:  No problem-specific Assessment & Plan notes found for this encounter.  No orders of  the defined types were placed in this encounter.  No orders of the defined types were placed in this encounter.   Smitty Cords, MD [place], PGY-3

## 2017-11-29 LAB — CULTURE, BLOOD (ROUTINE X 2)
CULTURE: NO GROWTH
Culture: NO GROWTH
SPECIAL REQUESTS: ADEQUATE
Special Requests: ADEQUATE

## 2017-12-04 ENCOUNTER — Other Ambulatory Visit: Payer: Self-pay | Admitting: Family Medicine

## 2017-12-04 ENCOUNTER — Encounter: Payer: Self-pay | Admitting: Family Medicine

## 2017-12-04 ENCOUNTER — Other Ambulatory Visit: Payer: Self-pay

## 2017-12-04 ENCOUNTER — Ambulatory Visit (INDEPENDENT_AMBULATORY_CARE_PROVIDER_SITE_OTHER): Payer: Self-pay | Admitting: Family Medicine

## 2017-12-04 ENCOUNTER — Telehealth: Payer: Self-pay | Admitting: Family Medicine

## 2017-12-04 VITALS — BP 138/78 | HR 108 | Temp 98.3°F | Ht 70.0 in | Wt 227.0 lb

## 2017-12-04 DIAGNOSIS — E1142 Type 2 diabetes mellitus with diabetic polyneuropathy: Secondary | ICD-10-CM

## 2017-12-04 DIAGNOSIS — N179 Acute kidney failure, unspecified: Secondary | ICD-10-CM

## 2017-12-04 DIAGNOSIS — Z794 Long term (current) use of insulin: Secondary | ICD-10-CM

## 2017-12-04 DIAGNOSIS — E1140 Type 2 diabetes mellitus with diabetic neuropathy, unspecified: Secondary | ICD-10-CM

## 2017-12-04 DIAGNOSIS — E7800 Pure hypercholesterolemia, unspecified: Secondary | ICD-10-CM

## 2017-12-04 DIAGNOSIS — R7989 Other specified abnormal findings of blood chemistry: Secondary | ICD-10-CM

## 2017-12-04 DIAGNOSIS — E785 Hyperlipidemia, unspecified: Secondary | ICD-10-CM

## 2017-12-04 MED ORDER — GABAPENTIN 300 MG PO CAPS: 600.0000 mg | ORAL_CAPSULE | Freq: Three times a day (TID) | ORAL | 0 refills | Status: DC

## 2017-12-04 MED ORDER — INSULIN GLARGINE 100 UNITS/ML SOLOSTAR PEN: 25.0000 [IU] | PEN_INJECTOR | Freq: Every day | SUBCUTANEOUS | 1 refills | Status: DC

## 2017-12-04 MED ORDER — LEVETIRACETAM 500 MG PO TABS: 500.0000 mg | ORAL_TABLET | Freq: Two times a day (BID) | ORAL | 2 refills | Status: DC

## 2017-12-04 NOTE — Telephone Encounter (Signed)
Patient is concerned because the seizure medication that was ordered is actually $84 at the pharmacy ([place]) today.  He was told today at his appt here that it was supposed to be $9.  Please call patient asap today [phone].

## 2017-12-04 NOTE — Patient Instructions (Signed)
It was a pleasure to see you today! Thank you for choosing [facility] for your primary care. Jeffrey [name] was seen for diabetes.   Our plans for today were:  Increase your Lantus to 25 units in the morning.   Keep your apidra the same.   Increase your gabapentin to 600mg  three times per day.   You should return to our clinic to see Dr. Valentina Lucks in 2 weeks for blood sugars.   Best,  Dr. Lindell Noe

## 2017-12-04 NOTE — Progress Notes (Signed)
   CC: hospital follow up   HPI Has had a couple of seizures since leaving the hospital, has not picked up his [place].   He is c/w his DM. States his CBGs have been poorly controlled and he knows that his Lantus dose  is not enough. Wants to increase to 30U. Using 20U lantus - meter reads high. Yesterday read 358 before breakfast. Takes 5U apidra with food in the morning with breakfast. Last low sugar was mid January, but he had used apidra without eating. Feels fatigued with high CBGs lately.   +burning pain over soles of both feet, tried icy hot for this, hot water helps. Is taking his gabapentin.   Alcohol - no beer yesterday, last drink Monday (32 oz beer). States he has significantly decreased his beer intake 2/2 cost.   ROS: denies CP, SOB, abd pain, dysuria, changes in BMs.    CC, SH/smoking status, and VS noted  Objective: BP 138/78 (BP Location: Right Arm, Patient Position: Sitting, Cuff Size: Normal)   Pulse (!) 108   Temp 98.3 F (36.8 C) (Oral)   Ht 5\' 10"  (1.778 m)   Wt 227 lb (103 kg)   SpO2 99%   BMI 32.57 kg/m  Gen: NAD, alert, cooperative, and pleasant. HEENT: NCAT, EOMI, PERRL CV: RRR, no murmur Resp: CTAB, no wheezes, non-labored Ext: No edema, warm Neuro: Alert and oriented, Speech clear, No gross deficits  Assessment and plan:  Type 2 diabetes mellitus with diabetic neuropathy, with long-term current use of insulin (HCC) Increase lantus to 25U and continue apidra with meals. Come back in 2 weeks to recheck with Dr. Valentina Lucks and pharmacy team.   Diabetic polyneuropathy associated with type 2 diabetes mellitus ([place]) Cr clearance can tolerate max dose gabapentin. Increase to 600mg  TID and continue symptomatic treatment with icy hot if it helps.   AKI (acute kidney injury) ([place]) Cr improved since hospitalization.   HYPERCHOLESTEROLEMIA Recheck lipids today per patient request.    Orders Placed This Encounter  Procedures  . Lipid panel  . Basic  metabolic panel    Meds ordered this encounter  Medications  . levETIRAcetam (KEPPRA) 500 MG tablet    Sig: Take 1 tablet (500 mg total) by mouth 2 (two) times daily.    Dispense:  60 tablet    Refill:  2  . DISCONTD: gabapentin (NEURONTIN) 300 MG capsule    Sig: Take 2 capsules (600 mg total) by mouth 3 (three) times daily.    Dispense:  360 capsule    Refill:  0  . insulin glargine (LANTUS) 100 unit/mL SOPN    Sig: Inject 0.25 mLs (25 Units total) into the skin daily before breakfast.    Dispense:  15 mL    Refill:  1    Order Specific Question:   Lot Number?    Answer:   [id number]    Order Specific Question:   Expiration Date?    Answer:   04/06/2019    Order Specific Question:   NDC    Answer:   5284-1324-40 [[id number]]  . gabapentin (NEURONTIN) 300 MG capsule    Sig: Take 2 capsules (600 mg total) by mouth 3 (three) times daily.    Dispense:  360 capsule    Refill:  0    Ralene Ok, MD, PGY2 12/05/2017 9:02 PM

## 2017-12-05 ENCOUNTER — Telehealth: Payer: Self-pay

## 2017-12-05 ENCOUNTER — Telehealth: Payer: Self-pay | Admitting: Student

## 2017-12-05 LAB — LIPID PANEL
CHOLESTEROL TOTAL: 144 mg/dL (ref 100–199)
Chol/HDL Ratio: 2.3 ratio (ref 0.0–5.0)
HDL: 62 mg/dL (ref >39)
LDL CALC: 62 mg/dL (ref 0–99)
Triglycerides: 100 mg/dL (ref 0–149)
VLDL CHOLESTEROL CAL: 20 mg/dL (ref 5–40)

## 2017-12-05 LAB — BASIC METABOLIC PANEL
BUN / CREAT RATIO: 10 (ref 9–20)
BUN: 14 mg/dL (ref 6–24)
CO2: 26 mmol/L (ref 20–29)
Calcium: 9.9 mg/dL (ref 8.7–10.2)
Chloride: 88 mmol/L — ABNORMAL LOW (ref 96–106)
Creatinine, Ser: 1.36 mg/dL — ABNORMAL HIGH (ref 0.76–1.27)
GFR calc Af Amer: 68 mL/min/{1.73_m2} (ref >59)
GFR, EST NON AFRICAN AMERICAN: 59 mL/min/{1.73_m2} — AB (ref >59)
Glucose: 606 mg/dL (ref 65–99)
POTASSIUM: 5.4 mmol/L — AB (ref 3.5–5.2)
SODIUM: 131 mmol/L — AB (ref 134–144)

## 2017-12-05 NOTE — Telephone Encounter (Signed)
Received a page with call back number [phone] and reference number [id number]. When I called back, answering machine says, "your call can't be completed at this time. Please call back later".

## 2017-12-05 NOTE — Assessment & Plan Note (Signed)
Increase lantus to 25U and continue apidra with meals. Come back in 2 weeks to recheck with Dr. Valentina Lucks and pharmacy team.

## 2017-12-05 NOTE — Telephone Encounter (Signed)
[facility] left message on nurse line to call regarding patient. Called back and informed of critical glucose value 606. Paged Dr. Lindell Noe no response after 30 minutes, spoke with Dr. Erin Hearing. Attempted to call patient on both numbers listed, voicemail left on both numbers.  Paged Dr. Lindell Noe through [facility] text, awaiting response. Preceptor aware. Wallace Cullens, RN

## 2017-12-05 NOTE — Assessment & Plan Note (Signed)
Cr improved since hospitalization.

## 2017-12-05 NOTE — Assessment & Plan Note (Signed)
Cr clearance can tolerate max dose gabapentin. Increase to 600mg  TID and continue symptomatic treatment with icy hot if it helps.

## 2017-12-05 NOTE — Assessment & Plan Note (Signed)
Recheck lipids today per patient request.

## 2017-12-05 NOTE — Telephone Encounter (Signed)
Reviewed lab results - noted CBG of 606. I see telephone note were I was paged. I have had both my pager and inpatient pagers all day, have not received a page regarding this lab. Attempted to call patient again, able to reach him. CBG down to 74 before dinner today. It was 331 this morning. States he did increase his Lantus to 25 and took his apidra last night and this morning. Took 2U apidra at noon today without eating lunch. States he also received [place] [facility] message and will pick up the [place]. States he is going to keep checking his CBGs and try to keep working on his diet. Reports he has his appt with Dr. Valentina Lucks in 2 weeks. Asked him to call or go to ED if CBG persistently elevated or he feels badly. He endorsed understanding.   Ralene Ok, MD

## 2017-12-05 NOTE — Telephone Encounter (Signed)
[facility] - they originally quoted me a price of ~$84 for 30-day supply of levetiracetam 500 mg BID. After discussion with the technician, it appears that they were using the incorrect NDC (one that was NOT on the $9 list). Technician states that she has placed order for levetiracetam NDC that is on the $9 and the prescription will be ready by Tuesday at [facility] Mr. Scannell to communicate these findings. No answer at either number. Left HIPAA-compliant VM.   Carlean Jews, Pharm.D., BCPS PGY2 Ambulatory Care Pharmacy Resident Phone: [phone]

## 2017-12-18 ENCOUNTER — Telehealth: Payer: Self-pay | Admitting: Pharmacist

## 2017-12-18 ENCOUNTER — Ambulatory Visit: Payer: Medicaid Other | Admitting: Pharmacist

## 2017-12-18 MED ORDER — INSULIN GLARGINE 100 UNITS/ML SOLOSTAR PEN: 20.0000 [IU] | PEN_INJECTOR | Freq: Every day | SUBCUTANEOUS | 1 refills | Status: DC

## 2017-12-18 MED ORDER — LEVETIRACETAM 500 MG PO TABS: 500.0000 mg | ORAL_TABLET | Freq: Two times a day (BID) | ORAL | 2 refills | Status: DC

## 2017-12-18 NOTE — Telephone Encounter (Signed)
Called patient today to assess CBGs in setting of missed appointment with pharmacy clinic. Patient reports he has had several hypoglycemic fasting blood sugars over the last few days. Reports he is taking Lantus 25 units daily and Apidra 5 units with meals. Reports the following CBG readings:  Fastings: 42, 50, 79, 277, 567, 548, 225 Pre-lunch: 133, 146 Pre-supper: 84, 65  Patient also reports that he has still not rec'd Keppra yet from the pharmacy.  -------------------------------------------------------- Called the pharmacy ([facility] [place]) and they state that the $9 list-eligible NDC for [place] [phone]) is on backorder.  Called several [facility] pharmacies and discovered several that had enough for partial fills:  [address] - [place] tabs [address] - [place] - 27 tabs  ASSESSMENT/PLAN:  DM - suspect that patient is experiencing frequent nocturnal hypoglycemia which results in hypoglycemia at fasting check OR in profound hyperglycemia after gluconeogenesis if the hypo event is missed. After discussion with Dr. Valentina Lucks, decreased Lantus to 20 units daily. Continued Apidra 5 units with meals.   Medication fill issues - After discussion with Dr. Ardelia Mems, will send prescriptions to [facility] pharmacies at [address] and [facility] for partial fills of Keppra 500 mg BID.   Called patient to relay that information however he did not answer. Left messages on home phone and mobile phones.   Carlean Jews, Pharm.D., BCPS PGY2 Ambulatory Care Pharmacy Resident Phone: [phone]

## 2017-12-25 ENCOUNTER — Encounter: Payer: Self-pay | Admitting: Pharmacist

## 2017-12-25 ENCOUNTER — Ambulatory Visit (INDEPENDENT_AMBULATORY_CARE_PROVIDER_SITE_OTHER): Payer: Self-pay | Admitting: Pharmacist

## 2017-12-25 DIAGNOSIS — E1142 Type 2 diabetes mellitus with diabetic polyneuropathy: Secondary | ICD-10-CM

## 2017-12-25 DIAGNOSIS — E1140 Type 2 diabetes mellitus with diabetic neuropathy, unspecified: Secondary | ICD-10-CM

## 2017-12-25 DIAGNOSIS — Z794 Long term (current) use of insulin: Secondary | ICD-10-CM

## 2017-12-25 DIAGNOSIS — E119 Type 2 diabetes mellitus without complications: Secondary | ICD-10-CM

## 2017-12-25 MED ORDER — INSULIN GLARGINE 100 UNITS/ML SOLOSTAR PEN: 10.0000 [IU] | PEN_INJECTOR | Freq: Every day | SUBCUTANEOUS | 1 refills | Status: DC

## 2017-12-25 MED ORDER — INSULIN GLULISINE 100 UNIT/ML SOLOSTAR PEN: PEN_INJECTOR | SUBCUTANEOUS | 3 refills | Status: DC

## 2017-12-25 NOTE — Patient Instructions (Addendum)
Thanks for coming to see [name]. I think your really really high blood sugars are coming from your extreme low blood sugars.  1. Decrease your Lantus to 10 units daily  2. Decrease your gabapentin to 2 pills (600 mg total) three times a day  3. Increase your Apidra to 6-7 units with meals.   4. Go to [facility] on [address] to pick up the keppra (seizure medicine).   Come back to see [name] in 2-3 weeks to go over blood sugars.

## 2017-12-25 NOTE — Progress Notes (Signed)
S:     Chief Complaint  Patient presents with  . Medication Management    Diabetes    Patient arrives in good spirits, ambulating without assistance.  Presents for diabetes evaluation, education, and management at the request of Dr. Lindell Noe. Patient was re-referred on 12/04/2017 however patient is well known to pharmacy clinic.  Patient was last seen by Primary Care Provider on 12/04/2017.   Patient brings in meter for review, CBG values range from 22 - 500+ mg/dL. Endorses adherence to insulin changes made last week. Reports that he is very concerned about hypoglycemia as he has had several episodes in the last week; one requiring EMS to be called after his wife was initally unable to wake him up after he fell asleep and tested his blood sugar to be 22 mg/dL.   Patient has a PMH of seizures controlled on two AED (valproate and levetiracetam), however he has had trouble obtaining his medications due to transportation and cost issues. Due to transportation issues, he hs not picked up his levetiracetam yet but has been taking his valproate.  Reports having up to 5 seizures last week that lasted a short duration in time.   Patient reports adherence with medications. However he has not picked up levetiracetam yet. States he plans to go today after the clinic visit. The patient is not taking their gabapentin at the prescribed dose; self-reports taking 1200 mg TID instead of prescribed 600 mg TID.   States he wants to talk about insomnia issues with Dr. Lindell Noe. Patient requested Requests that I call [facility] [place] [facility] to see what they cover.   Current diabetes medications include: Lantus 20 units daily (morning), Apridra 5 units with big meal, metformin 500 mg BID Current hypertension medications include: HCTZ 25 mg daily, azilsartan (Edarbi) 40 mg daily.   Patient reports SEVERE hypoglycemic events.  Patient reported dietary habits: Eats 1 "decent" meal in the morning and snack throughout the  rest of the day.   Patient reports nocturia 2-3 times daily Patient reports neuropathy. Improved on gabapentin.  Patient reports visual changes with hyperglycemia.  Patient reports self foot exams.  O:  Physical Exam  Constitutional: He appears well-developed and well-nourished.   Review of Systems  All other systems reviewed and are negative.   Lab Results  Component Value Date   HGBA1C 10.8 (H) 11/23/2017   There were no vitals filed for this visit.  Home fasting CBG: 230, 143, 248 2 hour post-prandial/random CBG: 554, 262, 195, 49 (2:17 PM), 147 (7:23PM), 160, 22 (10:38 PM), 40 (1:23 AM), 184, 274, 272, 142  10 year ASCVD risk calculation not available for patients with LDL-C < 70 mg/dL (LDL 63 mg/dL on 12/04/17) per [facility] online risk calculator.   A/P: Diabetes longstanding/newly diagnosed currently uncontrolled. Patient reports hypoglycemic events and is able to verbalize appropriate hypoglycemia management plan. Patient reports adherence with medication. Control is suboptimal due to unpredictable oral food intake, high risk of hypoglycemia, and difficulty affording medications.  Decreased dose of basal insulin Lantus (insulin glargine) to 10 units once per day. Increased dose of rapid insulin Novolog (insulin aspart) to 6-7 units with large meals (usually patient eats 1-2 large meals per day. Reminded patient to keep checking blood sugar daily and try thei r best to eat consistent meals. Goal is to minimize the risk of hypoglycemia in this patient and prevent another EMS.   Decrease gabapentin dose to 600 mg (2 capsules) three times a day.  Next A1C  anticipated May 2019 (last A1C 11/24/17).    ASCVD risk greater than 7.5%. Continued Aspirin 81 mg and Continued atorvastatin 80 mg.   Hypertension longstanding but currently controlled.  Patient reports adherence with medication.   Written patient instructions provided.  Total time in face to face counseling 45 minutes.   Follow  up in Pharmacist Clinic Visit in 2-3 weeks.   Patient seen with Hildred Alamin, PharmD Candidate and Deirdre Pippins, PGY2 Pharmacy Resident, PharmD, BCPS.

## 2017-12-26 ENCOUNTER — Encounter: Payer: Self-pay | Admitting: Pharmacist

## 2017-12-26 NOTE — Assessment & Plan Note (Addendum)
Diabetes longstanding/newly diagnosed currently uncontrolled. Patient reports hypoglycemic events and is able to verbalize appropriate hypoglycemia management plan. Patient reports adherence with medication. Control is suboptimal due to unpredictable oral food intake, high risk of hypoglycemia, and difficulty affording medications. Decreased dose of basal insulin Lantus (insulin glargine) to 10 units once per day. Increased dose of rapid insulin Novolog (insulin aspart) to 6-7 units with large meals (usually patient eats 1-2 large meals per day. Reminded patient to keep checking blood sugar daily and try thei r best to eat consistent meals. Goal is to minimize the risk of hypoglycemia in this patient and prevent another EMS.

## 2017-12-26 NOTE — Progress Notes (Signed)
Patient ID: Jeffrey [name], male   DOB: 03/29/1966, 52 y.o.   MRN: [id number] Reviewed: Agree with Dr. Graylin Shiver documentation and management.

## 2017-12-26 NOTE — Assessment & Plan Note (Signed)
Decrease gabapentin dose to 600 mg (2 capsules) three times a day.

## 2017-12-29 ENCOUNTER — Encounter: Payer: Self-pay | Admitting: Psychology

## 2017-12-29 ENCOUNTER — Telehealth: Payer: Self-pay | Admitting: Family Medicine

## 2017-12-29 NOTE — Telephone Encounter (Signed)
-----   Message from Carlean Jews, [facility] sent at 12/25/2017 10:38 AM EDT ----- Regarding: Request for sleep aid Hey,   We saw Jeffrey [name] today. He said he's going to pick up Keppra today, we shall see.   He asked specifically for something to help with sleep and states that he has tried to bring it up in the past but he always forgets/gets off track in his PCP visits.   I told him I'd send you a message so y'all can discuss at your next appointment (4/9). [facility] covers trazodone and remeron, just FYI. He's taken trazodone before.  Thanks,  Chrys Racer

## 2017-12-29 NOTE — Telephone Encounter (Signed)
Will address sleep at next visit.

## 2018-01-13 ENCOUNTER — Ambulatory Visit (INDEPENDENT_AMBULATORY_CARE_PROVIDER_SITE_OTHER): Payer: Self-pay | Admitting: Family Medicine

## 2018-01-13 ENCOUNTER — Other Ambulatory Visit: Payer: Self-pay | Admitting: Family Medicine

## 2018-01-13 ENCOUNTER — Other Ambulatory Visit: Payer: Self-pay

## 2018-01-13 DIAGNOSIS — F339 Major depressive disorder, recurrent, unspecified: Secondary | ICD-10-CM | POA: Insufficient documentation

## 2018-01-13 DIAGNOSIS — Z794 Long term (current) use of insulin: Secondary | ICD-10-CM

## 2018-01-13 DIAGNOSIS — I10 Essential (primary) hypertension: Secondary | ICD-10-CM

## 2018-01-13 DIAGNOSIS — F331 Major depressive disorder, recurrent, moderate: Secondary | ICD-10-CM

## 2018-01-13 DIAGNOSIS — F528 Other sexual dysfunction not due to a substance or known physiological condition: Secondary | ICD-10-CM

## 2018-01-13 DIAGNOSIS — E1140 Type 2 diabetes mellitus with diabetic neuropathy, unspecified: Secondary | ICD-10-CM

## 2018-01-13 MED ORDER — AZILSARTAN MEDOXOMIL 40 MG PO TABS: 1.0000 | ORAL_TABLET | Freq: Every day | ORAL | 3 refills | Status: DC

## 2018-01-13 MED ORDER — MIRTAZAPINE 15 MG PO TABS: 15.0000 mg | ORAL_TABLET | Freq: Every day | ORAL | 3 refills | Status: DC

## 2018-01-13 NOTE — Assessment & Plan Note (Addendum)
PHQ 9 16 today.  After greater than 30-minute discussion with patient, he is able to acknowledge that this is a problem for him.  He states he has been feeling depressed since his cardiac arrest, particularly because he has been unable to resume his job and he feels he is "unable to provide for his family."  He feels some societal and personal pressures to starting medication for his depression.  However, he trusts that I am recommending the most appropriate course.  I am concerned with his lethargy, insomnia, poor appetite.  We will start Remeron with indication for depression as well as insomnia.  Follow-up in 4 weeks to assess how this is doing.  We will additionally consider further counseling.  Patient has been seeing [facility] here over the past few months.  May require additional medications for depression as well.  Please ask permission to discuss this with the patient in future encounters, he was quite hesitant to discuss today and feels this is "his business."  Denies suicidal or homicidal ideation.

## 2018-01-13 NOTE — Patient Instructions (Addendum)
It was a pleasure to see you today! Thank you for choosing [facility] for your primary care. Jeffrey [name] was seen for blood pressure, blood sugar, sleep.   Our plans for today were:  Keep your insulin regimen the same and follow up with Dr. Valentina Lucks this week.   Start the new medicine (remeron) for sleep. Call me if this isn't working after a week or so, we can increase dose.   I resent a new prescription for the edarbi.   You should return to our clinic to see Dr. Lindell Noe in 2 month for sleep, blood pressure.   Best,  Dr. Lindell Noe

## 2018-01-13 NOTE — Progress Notes (Addendum)
CC: DM   HPI  DM - coming to see Dr. Valentina Lucks this thurs. Recent low to 22 (see Dr. Graylin Shiver note). 40 the next day. Asked multiple times - he states the plan was Lantus 20 in the morning (Dr. Graylin Shiver note suggests plan was 10U Lantus) and 3-4 apidra. CBG 400 this am > took 10 apidra, now 108. Feels lethargic today. Motivated. Hasnt been eating much, no appetite. Forgot his meter today.   Wt Readings from Last 3 Encounters:  01/13/18 227 lb (103 kg)  12/25/17 241 lb 1.6 oz (109.4 kg)  12/04/17 227 lb (103 kg)    HTN - out of ebarbi bc med assist told him that I dc'd it. I did not. He hasn't been checking BP at home. No feelings of hypotension.   Sleep - trouble falling asleep. Trouble w this since 51. Been up for 3 days at this point per his report. Has TV on when he goes to bed. States he sleeps at most 1-2 hours per night. This is chronic.   ROS: Denies CP, SOB, abdominal pain, dysuria, changes in BMs.   CC, SH/smoking status, and VS noted  Objective: BP 122/80   Pulse (!) 112   Temp 97.8 F (36.6 C) (Oral)   Wt 227 lb (103 kg)   SpO2 97%   BMI 32.57 kg/m   Manual pulse recheck after rest 96 Gen: NAD, alert, cooperative, and pleasant. HEENT: NCAT, EOMI, PERRL CV: RRR, no murmur Resp: CTAB, no wheezes, non-labored Abd: SNTND, BS present, no guarding or organomegaly Ext: No edema, warm Neuro: Alert and oriented, Speech clear, No gross deficits  Assessment and plan:  HYPERTENSION, BENIGN SYSTEMIC Received request for HCTZ from [place] [facility], but patient is on edarbi, which has a thiazide in it. Will refill ebarbi today.   Major depressive disorder, recurrent episode ([place]) PHQ 9 16 today.  After greater than 30-minute discussion with patient, he is able to acknowledge that this is a problem for him.  He states he has been feeling depressed since his cardiac arrest, particularly because he has been unable to resume his job and he feels he is "unable to provide for his  family."  He feels some societal and personal pressures to starting medication for his depression.  However, he trusts that I am recommending the most appropriate course.  I am concerned with his lethargy, insomnia, poor appetite.  We will start Remeron with indication for depression as well as insomnia.  Follow-up in 4 weeks to assess how this is doing.  We will additionally consider further counseling.  Patient has been seeing [facility] here over the past few months.  May require additional medications for depression as well.  Please ask permission to discuss this with the patient in future encounters, he was quite hesitant to discuss today and feels this is "his business."  Denies suicidal or homicidal ideation.  ERECTILE DYSFUNCTION Has previously been prescribed Viagra to [place], he reports they will no longer fill this.  I believe I filled out a discount form for him this fall for this, but he will discuss this with Dr. Valentina Lucks on 4/11 and I would be happy to sign any additional paperwork for such.  Type 2 diabetes mellitus with diabetic neuropathy, with long-term current use of insulin ([place]) Patient confused about Lantus dose - has been doing 20U rather than 10U as recommended at last appt w Dr. Valentina Lucks. However, has been having highs and not lows. Brittle diabetic even  when he is trying to do what we ask. I suspect sleep and lack of eating make his CBGs harder to control. Will start remeron as below to help with this. Asked him to continue current plan (20U lantus 3-4U apidra) to avoid further confusion before his appt. I did not tell him that he had been doing the wrong dose of Lantus, again because he is easily confused.   No orders of the defined types were placed in this encounter.   Meds ordered this encounter  Medications  . Azilsartan Medoxomil (EDARBI) 40 MG TABS    Sig: Take 1 tablet by mouth daily.    Dispense:  30 tablet    Refill:  3  . mirtazapine (REMERON) 15 MG tablet    Sig:  Take 1 tablet (15 mg total) by mouth at bedtime.    Dispense:  30 tablet    Refill:  3    Ralene Ok, MD, PGY2 01/15/2018 6:58 AM

## 2018-01-13 NOTE — Assessment & Plan Note (Signed)
Received request for HCTZ from [place] [facility], but patient is on edarbi, which has a thiazide in it. Will refill ebarbi today.

## 2018-01-14 NOTE — Assessment & Plan Note (Signed)
Has previously been prescribed Viagra to [place], he reports they will no longer fill this.  I believe I filled out a discount form for him this fall for this, but he will discuss this with Dr. Valentina Lucks on 4/11 and I would be happy to sign any additional paperwork for such.

## 2018-01-15 ENCOUNTER — Ambulatory Visit (INDEPENDENT_AMBULATORY_CARE_PROVIDER_SITE_OTHER): Payer: Self-pay | Admitting: Pharmacist

## 2018-01-15 ENCOUNTER — Encounter: Payer: Self-pay | Admitting: Pharmacist

## 2018-01-15 DIAGNOSIS — K7460 Unspecified cirrhosis of liver: Secondary | ICD-10-CM

## 2018-01-15 DIAGNOSIS — E1140 Type 2 diabetes mellitus with diabetic neuropathy, unspecified: Secondary | ICD-10-CM

## 2018-01-15 DIAGNOSIS — K7030 Alcoholic cirrhosis of liver without ascites: Secondary | ICD-10-CM

## 2018-01-15 DIAGNOSIS — Z794 Long term (current) use of insulin: Secondary | ICD-10-CM

## 2018-01-15 MED ORDER — LACTULOSE 10 GM/15ML PO SOLN: 10.0000 g | Freq: Every day | ORAL | 3 refills | Status: DC

## 2018-01-15 MED ORDER — DULAGLUTIDE 0.75 MG/0.5ML ~~LOC~~ SOAJ: 0.7500 mg | SUBCUTANEOUS | 6 refills | Status: DC

## 2018-01-15 MED ORDER — INSULIN GLARGINE 100 UNITS/ML SOLOSTAR PEN: 10.0000 [IU] | PEN_INJECTOR | Freq: Two times a day (BID) | SUBCUTANEOUS | 1 refills | Status: DC

## 2018-01-15 NOTE — Patient Instructions (Signed)
Thanks for coming to see [name]!   1. Start Trulicity 6.56 mg under the skin once a week  2. Split Lantus to 10 units twice a day  3. Continue Apidra 5 units with meals - try to do this twice a day WITH FOOD. If you are running in the 400s+ in the morning, it is OK to give yourself a shot of Apidra 5-10 units to bring yourself down but please try to have a snack with this.   4. When you get your mirtazapine prescription, take 1 pill at bedtime.  5. We refilled lactulose and sent to [facility] on [address] back to see Dr. Lindell Noe in 2 weeks, alternate with her and [name] every 2 weeks.

## 2018-01-15 NOTE — Assessment & Plan Note (Signed)
Patient confused about Lantus dose - has been doing 20U rather than 10U as recommended at last appt w Dr. Valentina Lucks. However, has been having highs and not lows. Brittle diabetic even when he is trying to do what we ask. I suspect sleep and lack of eating make his CBGs harder to control. Will start remeron as below to help with this. Asked him to continue current plan (20U lantus 3-4U apidra) to avoid further confusion before his appt. I did not tell him that he had been doing the wrong dose of Lantus, again because he is easily confused.

## 2018-01-15 NOTE — Progress Notes (Signed)
S:    Chief Complaint  Patient presents with  . Medication Management    T2DM   Patient arrives in a depressed mood and ambulating with assistance from a cane.  Presents for diabetes evaluation, education, and management at the request of Dr. Lindell Noe. Patient was referred on 01/13/18.  Patient was last seen by Primary Care Provider on 01/13/18. Last seen in Rx clinic on 12/25/17.  Seizures Since the last visit, pt has started levetiracetam for seizures. Denies any aggressive mood changes or hypersomnolence after starting. Denies any recent seizures.  Insomnia PMH includes longstanding insomnia and depressive mood that recently has worsened in the last month. Pt reports an inability to sleep and stays up all night. Reports sleeping around 1 - 3 hours a night. Tired throughout the day which results in day-time sleepiness. Recent worsening of mood and feels that he is unable to accomplish ADLs due to his depressed mood. Also reports weight-loss 2/2 to a loss of appetitite. Pt prescribed mirtazapine 15 mg for insomnia but has not yet started therapy due to a delay in delivery of his medications (mail-order). Anticipated delivery / start date: Fri 01/23/18.   T2DM Has a hx of severe (level II: BG < 54 mg/dL) hypoglycemic episodes. Currently blood sugar readings are much higher than normal per patient report. Attributes recent increases in BG 2/2 to stress at home particularly with arguments with wife. Patient denies recent hypoglycemic events (see Objective for SMBG values).  Current diabetes medications include: Lantus (insulin glargine) 20 units once daily in the AM, Apidra (insulin glulisine) 10 units once in the AM and then 5 units with a meal (if a meal is eaten), and metformin 500 mg twice daily.  Current hypertension medications include: Edarbi (azilsartan medoxomil) 40 mg once daily and hydrochlorothiazide 25 mg once daily at night. Patient reports adherence with medications.   Patient  reported dietary habits: Pt states he does not eat consistent meals. Last meal was a small portion of pinto beans for dinne last nightr. Drinking water, no other beverages. Feels no appetite when eating and self-reports recurrent vomiting episodes with the last episode occurring 4 days ago. Reports he is able to drink Glucerna and keep down liquids.    Patient reports self foot exams. No wounds or ulcers per patient report.  O:   Lab Results  Component Value Date   HGBA1C 10.8 (H) 11/23/2017   Vitals:   01/15/18 1346  BP: 138/88  Pulse: (!) 105  SpO2: 97%    Home fasting CBG: 108-182 mg/dL  2 hour post-prandial/random CBG: 214-524 mg/dL  -> HIGH (above meter threshold).  10 year ASCVD risk: 17.9%.  A/P: Diabetes longstanding currently uncontrolled due to dietary indiscretion, an inability to titrate up insulin due to frequent hypoglycemic events, and recent increases in stress. Patient denies hypoglycemic events since the last visit and is able to verbalize appropriate hypoglycemia management plan. Patient reports adherence with medication. Split basal insulin Lantus (insulin glargine) from 20 units once daily in AM to 10 units twice daily. Continued Apidra 5 units with meals. Continued correction dose of 5 - 10 units of Apidra if fasting BG in the morning is 400+ mg/dL. Initiated Trulicity (dulaglutide) 0.75 mg once weekly. Continue for four weeks and then up-titrate to 1.5 mg once weekly. Recommended pt to continue checking BG at home and to contact the clinic if BG levels are consistently low.   Next A1C anticipated May 2019.    ASCVD risk greater than  7.5%. Continued Aspirin 81 mg and Continued atorvastatin 80 mg.   Hypertension longstanding currently uncontrolled but prior office BP was at goal (122/80 mmHg on 01/13/18). Patient reports adherence with medication. Control is suboptimal due to recent increased stressors in life and increased sleep loss 2/2 to insomnia. Will re-check BP  at next visit with no medication changes at this time.    Written patient instructions provided.  Total time in face to face counseling 75 minutes. Follow up with Dr. Lindell Noe in 2 weeks. Follow up in Pharmacist Clinic Visit in 4 weeks.   Patient seen with Hildred Alamin, PharmD Candidate and Deirdre Pippins, PGY2 Pharmacy Resident, PharmD, BCPS.

## 2018-01-15 NOTE — Assessment & Plan Note (Signed)
Diabetes longstanding currently uncontrolled due to dietary indiscretion, an inability to titrate up insulin due to frequent hypoglycemic events, and recent increases in stress. Patient denies hypoglycemic events since the last visit and is able to verbalize appropriate hypoglycemia management plan. Patient reports adherence with medication. Split basal insulin Lantus (insulin glargine) from 20 units once daily in AM to 10 units twice daily. Continued Apidra 5 units with meals. Continued correction dose of 5 - 10 units of Apidra if fasting BG in the morning is 400+ mg/dL. Initiated Trulicity (dulaglutide) 0.75 mg once weekly. Continue for four weeks and then up-titrate to 1.5 mg once weekly. Recommended pt to continue checking BG at home and to contact the clinic if BG levels are consistently low.

## 2018-01-19 ENCOUNTER — Ambulatory Visit: Payer: Self-pay | Admitting: Psychology

## 2018-01-19 DIAGNOSIS — E1140 Type 2 diabetes mellitus with diabetic neuropathy, unspecified: Secondary | ICD-10-CM

## 2018-01-19 DIAGNOSIS — Z794 Long term (current) use of insulin: Principal | ICD-10-CM

## 2018-01-19 NOTE — Progress Notes (Signed)
Reason for follow-up:  Jeffrey [name] returns to discuss management of his poorly controlled diabetes.He is feeling depressed and not sleeping well. Dr. 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  Issues discussed:  The patient feels frustrated that his blood sugar level is not improving. He has little appetite. He has many stressors, such as his wife's health and conflict with her. They have custody of several children with special needs who receive SSDI. Jeffrey [name] is aware that his memory and cognition are problematic and this is concerning to him. He misses his job and feels very frustrated by his health problems. We discussed his previous plan to work on his diet. Jeffrey [name] stated that he has no appetite and does not like to eat healthy foods. I asked about his diet on a typical day. He sometimes eats an egg sandwich for breakfast and did not like the idea of using whole wheat bread (instead of white) or one slice of bread instead of two, but stated that he would consider it. We also discussed different options for making veggies taste good and he stated that he is "not allowed" to have "any" salt. I recommended that he discuss this with Dr. Lindell Noe and also attend one of the nutrition clinics led by Dr. Jenne Campus on Thursdays. I provided Jeffrey [name] with a SCAT application; he walks with a cane and has difficulty using the bus system. I also gave him a copy of the mobile market veggie flyer. Because he was expressing so much resistance with regard to dietary changes, I asked if he would like to choose a different area of focus. He elected to focus on monitoring his blood sugar more regularly. Jeffrey [name] admitted that his memory issues prevent him from doing this. We discussed strategies for helping with this; I prepared several large post-it notes with blanks for him to fill in three blood sugar readings each day. He  stated that he would give this a try.  I gave him the DDS; this took him a considerable time to complete: (1)5 (2)4 (3)5 (4)4 (5)4 (6)5 (7)3 (8)5 (9)3 (10)5 (11)5 (12)2 (13)4 (14)5 (15)1 (16)2 (17)2 Total DDS score = 3.76 (above cut-off of 3; significant) Emotional Burden = 5 (significant, well above cut-off) Physician-related Distress = 3 (significant) Regimen-related Distress = 4.6 (significant) Interpersonal Distress = 3 (significant) Thus, Jeffrey [name] is experiencing clinically significant diabetes-related distress on every scale with particular difficulties surrounding the emotional burden and regimen-related distress of his disease. He would benefit from additional support.

## 2018-01-19 NOTE — Assessment & Plan Note (Signed)
Assessment/Plan Recommendations: Jeffrey [name] has not yet received his newly-prescribed mirtazapine, but hopes that it will help with his depression, insomnia and poor appetite. His diabetes is causing him considerable distress. He is still drinking alcohol, but not every day. Jeffrey [name] admitted that he will sometimes buy a "40-ounce" beer when he has money. He is aware that this is not ideal, but is resistant to changing his eating and drinking habits. Accordingly, we discussed focusing on monitoring his blood sugar more frequently. He does not keep any sort of log, but will try to use the post-it note technique we discussed. He asked if he could hold off on another appointment until he sees how his new medication affects him.

## 2018-01-19 NOTE — Patient Instructions (Addendum)
Jeffrey [name]: It was good to see you today. I have given you a menu of options to consider in helping manage your diabetes. I like the idea of monitoring your blood sugar. Consider applying for SCAT to help with transportation. Call front desk regarding Thursday nutrition clinic. Please consider trying some new veggies. Recipes: Kale chips *wash and dry curly kale  *tear into small pieces *massage with olive oil (small amount) and garlic salt *cook at 122 degrees for 10-20 minutes  *check frequently because it will burn  -- best if a little brown on edges but still mostly green  Lemon-garlic-almond broccoli *wash and dry broccoli and cut into slices are small *toss with a little olive oil, garlic salt, and thin sliced almonds *cook at 400 degrees for around 20 minutes *drizzle some lemon juice and parmesan cheese before serving  As we discussed, you can call me or Neoma Laming to set up a follow-up appointment. Take care. -[place] [phone]

## 2018-01-21 ENCOUNTER — Other Ambulatory Visit: Payer: Self-pay | Admitting: *Deleted

## 2018-01-21 DIAGNOSIS — Z1211 Encounter for screening for malignant neoplasm of colon: Secondary | ICD-10-CM

## 2018-01-23 LAB — FECAL OCCULT BLOOD, IMMUNOCHEMICAL: Fecal Occult Bld: NEGATIVE

## 2018-02-05 ENCOUNTER — Ambulatory Visit (INDEPENDENT_AMBULATORY_CARE_PROVIDER_SITE_OTHER): Payer: Self-pay | Admitting: Pharmacist

## 2018-02-05 ENCOUNTER — Encounter: Payer: Self-pay | Admitting: Pharmacist

## 2018-02-05 ENCOUNTER — Other Ambulatory Visit: Payer: Self-pay | Admitting: Family Medicine

## 2018-02-05 ENCOUNTER — Other Ambulatory Visit: Payer: Self-pay | Admitting: Student

## 2018-02-05 DIAGNOSIS — Z794 Long term (current) use of insulin: Secondary | ICD-10-CM

## 2018-02-05 DIAGNOSIS — E1140 Type 2 diabetes mellitus with diabetic neuropathy, unspecified: Secondary | ICD-10-CM

## 2018-02-05 DIAGNOSIS — I10 Essential (primary) hypertension: Secondary | ICD-10-CM

## 2018-02-05 DIAGNOSIS — E119 Type 2 diabetes mellitus without complications: Secondary | ICD-10-CM

## 2018-02-05 DIAGNOSIS — E1142 Type 2 diabetes mellitus with diabetic polyneuropathy: Secondary | ICD-10-CM

## 2018-02-05 MED ORDER — INSULIN GLARGINE 100 UNITS/ML SOLOSTAR PEN: 10.0000 [IU] | PEN_INJECTOR | Freq: Two times a day (BID) | SUBCUTANEOUS | 3 refills | Status: DC

## 2018-02-05 MED ORDER — AZILSARTAN MEDOXOMIL 40 MG PO TABS: 1.0000 | ORAL_TABLET | Freq: Every day | ORAL | 3 refills | Status: DC

## 2018-02-05 MED ORDER — DULAGLUTIDE 0.75 MG/0.5ML ~~LOC~~ SOAJ: 0.7500 mg | SUBCUTANEOUS | 3 refills | Status: DC

## 2018-02-05 MED ORDER — NAPROXEN 500 MG PO TABS: 500.0000 mg | ORAL_TABLET | Freq: Two times a day (BID) | ORAL | 11 refills | Status: DC

## 2018-02-05 MED ORDER — INSULIN GLULISINE 100 UNIT/ML SOLOSTAR PEN: PEN_INJECTOR | SUBCUTANEOUS | 3 refills | Status: DC

## 2018-02-05 MED ORDER — DIVALPROEX SODIUM ER 500 MG PO TB24: 500.0000 mg | ORAL_TABLET | Freq: Two times a day (BID) | ORAL | 11 refills | Status: DC

## 2018-02-05 MED ORDER — HYDROCHLOROTHIAZIDE 25 MG PO TABS: 25.0000 mg | ORAL_TABLET | Freq: Every day | ORAL | 11 refills | Status: DC

## 2018-02-05 NOTE — Patient Instructions (Addendum)
Thanks for coming to see Jeffrey [name]! Your diabetes seems to be doing better. It's important that you do not skip meals!   Continue your lantus 10 units twice a day Continue Apidra 5 units with "good" meals We sent refills to [place] [facility] for the Depakote, hydrochlorothiazide, naproxen We sent refills to [facility] for Trulicity, Edarbi, Lantus, and Apidra  Follow up with Jeffrey [name] as needed

## 2018-02-05 NOTE — Assessment & Plan Note (Signed)
Hypertension longstanding, patient without all anti-HTN meds today due to medication access issues and miscommunication between clinic and [place]. Sent Edarbi Rx to [facility], sent refill for HCTZ to  [facility]. Will assess control at next visit.

## 2018-02-05 NOTE — Assessment & Plan Note (Signed)
Diabetes longstanding currently with greatly improved control since splitting lantus to BID dosing and adding appetite stimulant (mirtazapine), however with significant daily fluctuations and some hyopglycemia during times of fasting.  Patient reports adherence with medication. Control is suboptimal due to sporadic PO intake, stress/depression, brittle diabetes, limited medication access at times. - No change to insulins: continue Lantus 10 units BID, apidra 5 units with sizeable meals - Sent refills for insulins and prescription for Trulicity 3.33 mg once weekly to [place] [facility] - Counseled on importance of regular PO intake - Next A1C anticipated 02/20/18 or later

## 2018-02-05 NOTE — Progress Notes (Signed)
S:     Chief Complaint  Patient presents with  . Medication Management    diabetes    Patient arrives in good spirits ambulating without assistance.  Presents for diabetes evaluation, education, and management at the request of Dr. Lindell Noe. Patient was referred on 01/13/18.  Patient was last seen by Primary Care Provider on 01/13/18. Patient brings in CBG meter and all medications for review today.   He reports his CBGs have "evened out" but does endorse some hypoglycemic episodes. Reports issues with refills on Edarbi (has been out x1 month) and HCTZ (took last dose last night). Has not received Trulicity from [facility].  Reports he has been taking mirtazapine nightly and states sleep, appetite, and mood have all improved. Does continue to complain he will only sleep 1-2 hours after taking the mirtazapine. Stopped glucerna and bought protein powder that he uses now. Denies seizures since starting levetiracetam.  Social hx: Wife dx with pancreatic CA.  Patient reports adherence with diabetes medications that he has (no Trulicity, no Edarbi, no HCTZ). Taking lactulose PRN.  Current diabetes medications include: Lantus 10 units BID, apidra 5 units with breakfast (if "good sized") and evening meal.  Current hypertension medications include: none at present  Patient reports hypoglycemic events. Tends to be in afternoon or overnight when he skips meals/snacks.  Patient reported dietary habits: Eats 2 meals/day Breakfast: wakes up 9AM - [place] sandwich or eggs+toast+turkey bacon Lunch:skips Dinner:chicken breast (baked) + steamed broccoli Snacks:fruit cups Drinks:water, 2% milk with meals  Patient reported exercise habits: walking outside ~1 mile 2-3 times weekly (walks to friend's house)    Patient reports nocturia. 3-4 times nightly.  Patient reports neuropathy. Patient reports visual changes, chronic (can't see out of R eye) Patient reports self foot exams. No issues.   O:    Physical Exam  Constitutional: He appears well-developed and well-nourished.     Review of Systems  Cardiovascular: Positive for leg swelling (1+ pitting bilaterally).  Musculoskeletal: Negative for falls.  Neurological: Negative for seizures and loss of consciousness.     Lab Results  Component Value Date   HGBA1C 10.8 (H) 11/23/2017   Home fasting CBG: (70s-190s, excursions to 38 overnight, 293, 341  293, 76, 81, 145, 341, 190, 157, 195, 188, 122, 38 (1AM), 183, 134  2 hour post-prandial/random CBG: 60s-210s, excursions to 42, 45, 237 237, 200, 213, 161, 224, 138, 138, 202, 96, 65 (12PM), 109, 139, 45 (12PM), 70 (5:40 PM), 173, 121, 171, 252, 79 (8PM), 42 (1PM), 118  ASCVD risk score = 17.9%  Per [place] [facility] - Edarbi was d/c'd in February, needs refills on insulins Per [facility] - needs refills on depakote, HCTZ, naproxen. Per [facility], they cannot fill Trulicity for patients that live outside of [place]  A/P: Diabetes longstanding currently with greatly improved control since splitting lantus to BID dosing and adding appetite stimulant (mirtazapine), however with significant daily fluctuations and some hyopglycemia during times of fasting.  Patient reports adherence with medication. Control is suboptimal due to sporadic PO intake, stress/depression, brittle diabetes, limited medication access at times. - No change to insulins: continue Lantus 10 units BID, apidra 5 units with sizeable meals - Sent refills for insulins and prescription for Trulicity 1.32 mg once weekly to [place] [facility] - Counseled on importance of regular PO intake - Next A1C anticipated 02/20/18 or later  ASCVD risk greater than 7.5%. Continued Aspirin 81 mg and Continued atorvastatin 80  mg.   Hypertension longstanding, patient without all anti-HTN meds today due to medication access issues and miscommunication between clinic and [place] [facility]. Sent Edarbi Rx to [facility],  sent refill for HCTZ to [facility]. Will assess control at next visit.  Mediation access - patient with high need for well-coordinated care as he has to fill medications in 3 different locations. FYI: Trulicity, Edarbi, Apidra, Lantus, dexlansoprazole all come through [facility], gets Keppra at [facility] (specific [place] on $9 list: [phone]), gets all other meds at [facility]. Sent refills for the above requested medications.   Written patient instructions provided.  Total time in face to face counseling 45 minutes.   Follow up with PCP as scheduled on 02/09/18, follow up with Rx clinic as needed.   Patient seen with Richardine Service, PharmD Candidate, Angus Seller, PharmD, PGY1 Pharmacy Resident and Deirdre Pippins, PharmD, BCPS, PGY2 Pharmacy Resident.

## 2018-02-09 ENCOUNTER — Other Ambulatory Visit: Payer: Self-pay

## 2018-02-09 ENCOUNTER — Encounter: Payer: Self-pay | Admitting: Psychology

## 2018-02-09 ENCOUNTER — Ambulatory Visit (INDEPENDENT_AMBULATORY_CARE_PROVIDER_SITE_OTHER): Payer: Self-pay | Admitting: Family Medicine

## 2018-02-09 ENCOUNTER — Encounter: Payer: Self-pay | Admitting: Family Medicine

## 2018-02-09 VITALS — BP 174/92 | HR 84 | Temp 97.9°F | Ht 70.0 in | Wt 250.0 lb

## 2018-02-09 DIAGNOSIS — R29898 Other symptoms and signs involving the musculoskeletal system: Secondary | ICD-10-CM

## 2018-02-09 DIAGNOSIS — F331 Major depressive disorder, recurrent, moderate: Secondary | ICD-10-CM

## 2018-02-09 DIAGNOSIS — Z794 Long term (current) use of insulin: Secondary | ICD-10-CM

## 2018-02-09 DIAGNOSIS — I469 Cardiac arrest, cause unspecified: Secondary | ICD-10-CM

## 2018-02-09 DIAGNOSIS — R569 Unspecified convulsions: Secondary | ICD-10-CM

## 2018-02-09 DIAGNOSIS — I10 Essential (primary) hypertension: Secondary | ICD-10-CM

## 2018-02-09 DIAGNOSIS — F322 Major depressive disorder, single episode, severe without psychotic features: Secondary | ICD-10-CM

## 2018-02-09 DIAGNOSIS — E1140 Type 2 diabetes mellitus with diabetic neuropathy, unspecified: Secondary | ICD-10-CM

## 2018-02-09 MED ORDER — HYDROCHLOROTHIAZIDE 25 MG PO TABS: 25.0000 mg | ORAL_TABLET | Freq: Every day | ORAL | 0 refills | Status: DC

## 2018-02-09 MED ORDER — MIRTAZAPINE 15 MG PO TABS: 30.0000 mg | ORAL_TABLET | Freq: Every day | ORAL | 3 refills | Status: DC

## 2018-02-09 MED ORDER — FLUTICASONE PROPIONATE 50 MCG/ACT NA SUSP: 1.0000 | Freq: Every day | NASAL | 2 refills | Status: DC

## 2018-02-09 MED FILL — HYDROCHLOROTHIAZIDE 25 MG T: 25 | 30 days supply | Qty: 30 | Fill #0

## 2018-02-09 NOTE — Patient Instructions (Addendum)
It was a pleasure to see you today! Thank you for choosing [facility] for your primary care. Jeffrey [name] was seen for DM, mood, HTN.  Our plans for today were:  We will fill your HCTZ to [facility].  Keep your diabetes plan the same.   Increase your mirtazapine to 30mg  every night.   Call your neurologist for an appt.   Try the nasal spray.   You should return to our clinic to see Dr. Lindell Noe in 1 month for med follow up.   Best,  Dr. Lindell Noe

## 2018-02-09 NOTE — Progress Notes (Addendum)
Reason for follow-up:  Dr. Lindell Noe asked me to talk with Jeffrey [name] regarding his depression.  Issues discussed:  I spent about 45 minutes with Jeffrey [name] listening as he described the history of his marriage and career. He is feeling very depressed because he is no longer working and providing financially for his family. He feels that his wife no longer appreciates him and that they no longer interact much. He also expressed his frustration with problems of erectile dysfunction. In short, the patient is very upset that his career and marriage have changed due to his many health problems.  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 I provided empathy and reflective listening. The patient has difficulty maintaining his train of thought so it was difficult to keep him on topic. We discussed using the GLAD technqiue for the next couple of weeks. Each day he will try to identify something for which he is grateful, something he learned, something he accomplished and something that provoked delight. He would like to discuss his marital issues with a [facility] and had previously scheduled an appointment with [name] for [phone], so he will follow up with her at that time.   PHQ-9 today is 20, very difficult Anhedonia:   3 Mood:  3 Sleep:  3 Energy: 3 Appetite: 2 Worthless: 2 Concentrate: 2 Psychomotor: 2 SI:  0  GAD 7 : Generalized Anxiety Score 10/13/2017  Nervous, Anxious, on Edge 0  Control/stop worrying 0  Worry too much - different things 0  Trouble relaxing 2  Restless 0  Easily annoyed or irritable 1  Afraid - awful might happen 0  Total GAD 7 Score 3  Anxiety Difficulty Not difficult at all   GAD 7 : Generalized Anxiety Score  02/09/18  Nervous, Anxious, on Edge 3  Control/stop worrying 2  Worry too much - different things 3  Trouble relaxing 2  Restless 2  Easily annoyed or irritable 2  Afraid - awful might happen 2  Total GAD 7 Score 17  Anxiety Difficulty Somewhat difficult

## 2018-02-09 NOTE — Assessment & Plan Note (Addendum)
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 I provided empathy and reflective listening. The patient has difficulty maintaining his train of thought so it was difficult to keep him on topic. We discussed using the GLAD technqiue for the next couple of weeks. Each day he will try to identify something for which he is grateful, something he learned, something he accomplished and something that provoked delight. He would like to discuss his marital issues with a [facility] and had previously scheduled an appointment with [name] for [phone], so he will follow up with her at that time.   PHQ-9 today is 20, very difficult Anhedonia:   3 Mood:  3 Sleep:  3 Energy: 3 Appetite: 2 Worthless: 2 Concentrate: 2 Psychomotor: 2 SI:  0  GAD 7 : Generalized Anxiety Score 10/13/2017  Nervous, Anxious, on Edge 0  Control/stop worrying 0  Worry too much - different things 0  Trouble relaxing 2  Restless 0  Easily annoyed or irritable 1  Afraid - awful might happen 0  Total GAD 7 Score 3  Anxiety Difficulty Not difficult at all   GAD 7 : Generalized Anxiety Score 02/09/18  Nervous, Anxious, on Edge 3  Control/stop worrying 2  Worry too much - different things 3  Trouble relaxing 2  Restless 2  Easily annoyed or irritable 2  Afraid - awful might happen 2  Total GAD 7 Score 17  Anxiety Difficulty Somewhat difficult

## 2018-02-09 NOTE — Progress Notes (Signed)
CC: DM, mood f;u  HPI DM - he states his plan is as follows. He supposed to be taking Lantus 10U BID and apidra 5U w sizable meals. Last low was not in that last 2 weeks. Since last visit, he has had no lows. Yesterday he ate 2 meals, took apidra after breakfast, then ate a snack for dinner (protein drink), no apidra. Can't recall his sugars of the last 2 weeks. Did bring meter: 140-191 fasting, 2 CBGs >300 after eating large meals. Hasn't gotten the trulicity yet.   Seizure Friday when he was sitting on the edge of the bed. consciously laid back in bed. Was "uncontrollable." hasn't been able to get to neuro in 1 year.   Mood - feels the remeron is helping. He says, "this isn't the right dose, because it knocked me out." but then describes that he slept 2 hours after Feels apathetic = "doesn't want to do anything." PHQ 9 and GAD 7 today. Denies SI/HI. Sleep some improved. Has noticed an increase in appetite.   HTN - has not been able to get his Edarbi from [facility] and HCTZ from [facility]. Home BP elevated too.   Wants exercise program. States he has worsening LE weakness. This has been going on since his cardiac arrest.   ROS: Denies CP, SOB, abdominal pain, dysuria, changes in BMs.   CC, SH/smoking status, and VS noted  Objective: BP (!) 174/92   Pulse 84   Temp 97.9 F (36.6 C) (Oral)   Ht 5\' 10"  (1.778 m)   Wt 250 lb (113.4 kg)   SpO2 99%   BMI 35.87 kg/m  Gen: NAD, alert, cooperative, and pleasant. HEENT: NCAT, EOMI, PERRL CV: RRR, no murmur Resp: CTAB, no wheezes, non-labored Abd: SNTND, BS present, no guarding or organomegaly Ext: No edema, warm Neuro: Alert and oriented, Speech clear, No gross deficits Diabetic Foot Check -  Appearance - no lesions, ulcers or calluses Skin - no unusual pallor or redness Monofilament testing -  Right - Great toe, medial, central, lateral ball and posterior foot intact Left - Great toe, medial, central, lateral ball and posterior foot  intact   Assessment and plan:  HYPERTENSION, BENIGN SYSTEMIC Gave emergency refill of HCTZ to [facility] via indigent fund as he still has not received his meds from [facility].   Cardiac arrest ([place]) Feels he has not been strong enough to exercise since this event, also has a lot of guilty feelings around it. For deconditioning (he is unable to stand from sitting without using his arms to press on exam table), will refer to PT. For emotional aspect, warm handoff given to [facility].   Type 2 diabetes mellitus with diabetic neuropathy, with long-term current use of insulin ([place]) Continue current plan. Patient states he hasn't received trulicity yet.   Seizure [facility]) Encouraged him to make neurology appt.   Major depressive disorder, recurrent episode ([place]) Increase remeron as this is helping some. PHQ9 20 and GAD7 17. Follow up with me in 1 month.    Orders Placed This Encounter  Procedures  . Ambulatory referral to Physical Therapy    Referral Priority:   Routine    Referral Type:   Physical Medicine    Referral Reason:   Specialty Services Required    Requested Specialty:   Physical Therapy    Number of Visits Requested:   1    Meds ordered this encounter  Medications  . hydrochlorothiazide (HYDRODIURIL) 25 MG  tablet    Sig: Take 1 tablet (25 mg total) by mouth daily.    Dispense:  30 tablet    Refill:  0    [facility]  . mirtazapine (REMERON) 15 MG tablet    Sig: Take 2 tablets (30 mg total) by mouth at bedtime.    Dispense:  60 tablet    Refill:  3  . fluticasone (FLONASE) 50 MCG/ACT nasal spray    Sig: Place 1 spray into both nostrils daily.    Dispense:  16 g    Refill:  2     Ralene Ok, MD, PGY2 02/11/2018 9:36 AM

## 2018-02-10 ENCOUNTER — Telehealth: Payer: Self-pay

## 2018-02-10 ENCOUNTER — Other Ambulatory Visit: Payer: Self-pay

## 2018-02-10 DIAGNOSIS — E1169 Type 2 diabetes mellitus with other specified complication: Secondary | ICD-10-CM

## 2018-02-10 DIAGNOSIS — E785 Hyperlipidemia, unspecified: Principal | ICD-10-CM

## 2018-02-10 NOTE — Assessment & Plan Note (Signed)
Feels he has not been strong enough to exercise since this event, also has a lot of guilty feelings around it. For deconditioning (he is unable to stand from sitting without using his arms to press on exam table), will refer to PT. For emotional aspect, warm handoff given to [facility].

## 2018-02-10 NOTE — Telephone Encounter (Signed)
Pt left message on nurse line that he needed depakote and omeprazole refilled. Attempted to call patient back- depakote was sent to [facility] in [place], and Dr. Valentina Lucks changed his rx for omeprazole to dexlansoprazole- also sent to [facility]. Left message with above information on patients VM and advised to call back with any questions. Wallace Cullens, RN

## 2018-02-10 NOTE — Assessment & Plan Note (Signed)
Encouraged him to make neurology appt.

## 2018-02-10 NOTE — Assessment & Plan Note (Addendum)
Increase remeron as this is helping some. PHQ9 20 and GAD7 17. Follow up with me in 1 month.

## 2018-02-10 NOTE — Assessment & Plan Note (Signed)
Continue current plan. Patient states he hasn't received trulicity yet.

## 2018-02-10 NOTE — Assessment & Plan Note (Signed)
Gave emergency refill of HCTZ to [facility] via indigent fund as he still has not received his meds from [facility] assist.

## 2018-02-11 MED ORDER — ATORVASTATIN CALCIUM 80 MG PO TABS: 80.0000 mg | ORAL_TABLET | Freq: Every day | ORAL | 3 refills | Status: DC

## 2018-02-19 ENCOUNTER — Ambulatory Visit: Payer: Self-pay | Admitting: Licensed Clinical Social Worker

## 2018-02-19 DIAGNOSIS — F419 Anxiety disorder, unspecified: Principal | ICD-10-CM

## 2018-02-19 DIAGNOSIS — F32A Depression, unspecified: Secondary | ICD-10-CM

## 2018-02-19 DIAGNOSIS — F329 Major depressive disorder, single episode, unspecified: Secondary | ICD-10-CM

## 2018-02-19 NOTE — Progress Notes (Signed)
Type of Service: Integrated Behavioral Health 5th F/U Visit Total time:40 minutes :  Interpreter:No.    Reason for follow-up: Continue brief intervention to assist patient with managing symptoms of anxiety and depression, as well as managing stressors related to medical conditions and concerns with his sleep. Reports  somewhat difficulty in daily functions.  Patient is pleasant and engaged in conversation.   Appearance:Neat ; Thought process: Coherent; Affect: Appropriate: No plan to harm self or others.    GAD 7 : Generalized Anxiety Score 02/19/2018 10/13/2017  Nervous, Anxious, on Edge 1 0  Control/stop worrying 2 0  Worry too much - different things 2 0  Trouble relaxing 3 2  Restless 1 0  Easily annoyed or irritable 2 1  Afraid - awful might happen 1 0  Total GAD 7 Score 12 3  Anxiety Difficulty Somewhat difficult Not difficult at all   Depression screen [facility] 2/9 02/19/2018 02/09/2018 01/13/2018  Decreased Interest 2 0 3  Down, Depressed, Hopeless 1 0 3  PHQ - 2 Score 3 0 6  Altered sleeping 1 - 3  Tired, decreased energy 3 - 3  Change in appetite 2 - -  Feeling bad or failure about yourself  3 - -  Trouble concentrating 2 - -  Moving slowly or fidgety/restless 1 - -  Suicidal thoughts 0 - -  PHQ-9 Score 15 - 12  Difficult doing work/chores - - -  Some recent data might be hidden   Patient is making progress towards goal.  States he is feeling a little better. This is also evident by PHQ-9/GAD score above which is lower than previous score fo GAD=17 and PHQ-9=20 during visit with PCP.  Patient is taking medication as prescribed by PCP. Concerns with sleep started while in [place], patient unable to think of a time he has not had difficulty with sleep.  He has decided he does not want to make any major changes at this time. Goals: Patient will reduce symptoms of: anxiety and depression , and increase ability NO:BSJGGE skills, healthy habits, self-management skills and stress reduction,  Increase healthy adjustment to current life circumstances. Intervention: Solution-Focused Strategies, Supportive Counseling and Sleep Hygiene, Reflective listening as well as  Psychoeducation and food resources  Issues discussed: access to healthy food  ; concerns with sleep ;  Current Activity level; goals ; adjusting to his new normal. Assessment:Patient continues to experience symptoms of depression and anxiety.  Symptoms exacerbated by life transitions, limitations with health concerns and adjusting to his new normal. Patient wants to work on healthy eating and being more active.  He is willing to make one change related to sleep hygiene. Plan: 1. Patient will F/U with LCSW  Or [place]rene [place] as needed 2. Behavioral recommendations: Behavior Activation and limit computer time prior to going to bed 3. Referral: Community Resources:  Food,   [name], LCSW Licensed Clinical Social Worker [facility]   [phone] 3:34 PM

## 2018-02-26 ENCOUNTER — Ambulatory Visit (INDEPENDENT_AMBULATORY_CARE_PROVIDER_SITE_OTHER): Payer: Self-pay | Admitting: Pharmacist

## 2018-02-26 ENCOUNTER — Encounter: Payer: Self-pay | Admitting: Pharmacist

## 2018-02-26 DIAGNOSIS — E7800 Pure hypercholesterolemia, unspecified: Secondary | ICD-10-CM

## 2018-02-26 DIAGNOSIS — Z794 Long term (current) use of insulin: Secondary | ICD-10-CM

## 2018-02-26 DIAGNOSIS — E1142 Type 2 diabetes mellitus with diabetic polyneuropathy: Secondary | ICD-10-CM

## 2018-02-26 DIAGNOSIS — I10 Essential (primary) hypertension: Secondary | ICD-10-CM

## 2018-02-26 DIAGNOSIS — E1140 Type 2 diabetes mellitus with diabetic neuropathy, unspecified: Secondary | ICD-10-CM

## 2018-02-26 DIAGNOSIS — E119 Type 2 diabetes mellitus without complications: Secondary | ICD-10-CM

## 2018-02-26 MED ORDER — INSULIN GLULISINE 100 UNIT/ML SOLOSTAR PEN: PEN_INJECTOR | SUBCUTANEOUS | 3 refills | Status: DC

## 2018-02-26 NOTE — Progress Notes (Signed)
S:     Chief Complaint  Patient presents with  . Medication Management    diabetes    Patient arrives in good spirits, with all medications, ambulating without assistance.  Presents for diabetes evaluation, education, and management at the request of Dr. Lindell Noe. Patient was referred on 01/13/18.  Patient was last seen by Primary Care Provider on 02/09/18. At that time, he was given emergency refill for HCTZ at [facility] and mirtazapine was increased.   Patient reports he is doing well. Reports he has not had any seizure activity since last visit with a Family Medicine Provider. States he has almost all medications, including increased dose of mirtazapine and HCTZ. Does not have Viagra (has patient assistance application at home) and Trulicity (attempting to obtain from [employer] and repots he mailed 2019 financial documents this morning). Reports appetite has significantly increased and he has been getting more regular exercise. Patient brings in CBG meter for review.   Insurance coverage/medication affordability: Trulicity, Edarbi, Apidra, Lantus, dexlansoprazole all come through [facility], gets Keppra at [facility] (specific [place] on $9 list: [phone]), gets all other meds at [facility].   Patient reports OK adherence with medications.  Current diabetes medications include: Lantus 10 units BID, apidra 5-10 units with large meals Current hypertension medications include: HCTZ 25 mg (takes at night), azilsartan 40 mg nightly  Patient reports hypoglycemic events. Patient admits to skipping apidra on days when he forgets or when he has just had a low BG or when his BG is in the low 100s. He also admits to increasing apidra dose to 10 units when BG is in 300s or higher.    Patient reported dietary habits: Eats 2 meals/day Breakfast:2 eggs, [place] bacon, toast Lunch:skipped Dinner:lamb chops, steamed broccoli, garlic toast VFIEPP:2% milk  Patient-reported  exercise habits: daily - walking ~1 mile and doing squats   Patient reports nocturia. "A BUNCH" - complicated picture as he takes HCTZ in the mornings.  Patient reports neuropathy. States gabapentin is helpful. Patient reports visual changes. Patient reports self foot exams.   O:  Physical Exam  Constitutional: He appears well-developed and well-nourished.     Review of Systems  All other systems reviewed and are negative.    Lab Results  Component Value Date   HGBA1C 10.8 (H) 11/23/2017   Vitals:   02/26/18 1047 02/26/18 1125  BP: (!) 140/102 (!) 132/92  Pulse: (!) 106   SpO2: 98%     Lipid Panel     Component Value Date/Time   CHOL 144 12/04/2017 1124   TRIG 100 12/04/2017 1124   HDL 62 12/04/2017 1124   CHOLHDL 2.3 12/04/2017 1124   CHOLHDL 4.3 05/27/2016 1123   VLDL 58 (H) 05/27/2016 1123   LDLCALC 62 12/04/2017 1124   LDLDIRECT 89 12/23/2012 1414    Home fasting CBG: 70s-320s 2 hour post-prandial/random CBG: 50s-460s   Clinical ASCVD: No  ASCVD risk factors : age 82-75, HTN, DM 10 year ASCVD risk: 18.4%   A/P: Diabetes longstanding currently with erratic control as evidenced by CBGs. Appears that Apidra 10 units will bring fastings of 300+ down to 80s over 3-4 hours. Obvious pattern of hypoglycemia mid-day or at supper time. Patient is able to verbalize appropriate hypoglycemia management plan. Patient is adherent with medication. Control is suboptimal due to suspected increased insulin sensitivity, variable PO intake, stress, and uncontrolled tooth infections. -Continued basal insulin Lantus (insulin glargine) 10 units BID. Adjusted rapid insulin  Apidra (insulin glulisine) to 3 units for pre-meal CBG 80-200, 5 units if 201-300, and 7 units if >300. -Continued GLP-1 Trulicity (dulaglutide) 0.75 mg weekly when he receives from [facility]. Pt has submitted financial reports as of this morning (5/23).  -Consider addition of SGLT2 inhibitor to reduce glycemic  excursions in the future. Last BMET with acceptable renal function. -Counseled on s/sx of and management of hypoglycemia -Next A1C anticipated at next clinic visit 03/2018.   ASCVD risk - primary prevention in patient with DM. Last LDL is controlled. ASCVD risk score is not >20%  - moderate intensity statin indicated, high intensity statin also reasonable. Aspirin is indicated.  -Continued aspirin 81 mg  -Continued Atorvastatin 80 mg.   Hypertension longstanding since 2008 currently uncontrolled.  BP goal = 130/80 mmHg. Patient is adherent with medication. Control is suboptimal due to sub-optimized anti-hypertensive regimen.  -Continued HCTZ 25mg  and Edarbi (azilsartan) 40mg  daily at this time. Will consider increasing dose of Edarbi if still uncontrolled at next clinic visit. Recommended pt to take HCTZ in the morning in order to reduce bathroom visits at night.  Written patient instructions provided.  Total time in face to face counseling 60 minutes.   Follow up PCP Clinic Visit with Dr. Lindell Noe on 03/13/2018.   Patient seen with Richardine Service, PharmD Candidate and Deirdre Pippins, PharmD, BCPS, PGY2 Pharmacy Resident.

## 2018-02-26 NOTE — Patient Instructions (Addendum)
Good to see you!   1. Take your HCTZ (hydrochlorothiazide) in the mornings. 2. Change apidra:   - if blood sugar is in the 100s, give 3 units  - if blood sugar is in the 200s, give 5 units  - If blood sugar is in the 300s, give 7 units 3. Continue Lantus 10 units twice a day 4. Bring 2019 income information to [place] [facility] 5. Try to check your blood sugar in the middle of the day more often.   Bring in your meter and meds to your visit with Dr. Lindell Noe on 6/7. Come back to see me sometime in June.

## 2018-02-26 NOTE — Assessment & Plan Note (Signed)
Diabetes longstanding currently with erratic control as evidenced by CBGs. Appears that Apidra 10 units will bring fastings of 300+ down to 80s over 3-4 hours. Obvious pattern of hypoglycemia mid-day or at supper time. Patient is able to verbalize appropriate hypoglycemia management plan. Patient is adherent with medication. Control is suboptimal due to suspected increased insulin sensitivity, variable PO intake, stress, and uncontrolled tooth infections. -Continued basal insulin Lantus (insulin glargine) 10 units BID. Adjusted rapid insulin Apidra (insulin glulisine) to 3 units for pre-meal CBG 80-200, 5 units if 201-300, and 7 units if >300. -Continued GLP-1 Trulicity (dulaglutide) 0.75 mg weekly when he receives from [facility]. Pt has submitted financial reports as of this morning (5/23).  -Consider addition of SGLT2 inhibitor to reduce glycemic excursions in the future. Last BMET with acceptable renal function. -Counseled on s/sx of and management of hypoglycemia -Next A1C anticipated at next clinic visit 03/2018.

## 2018-02-26 NOTE — Assessment & Plan Note (Signed)
Hypertension longstanding since 2008 currently uncontrolled.  BP goal = 130/80 mmHg. Patient is adherent with medication. Control is suboptimal due to sub-optimized anti-hypertensive regimen.  -Continued HCTZ 25mg  and Edarbi (azilsartan) 40mg  daily at this time. Will consider increasing dose of Edarbi if still uncontrolled at next clinic visit. Recommended pt to take HCTZ in the morning in order to reduce bathroom visits at night.

## 2018-02-26 NOTE — Assessment & Plan Note (Signed)
ASCVD risk - primary prevention in patient with DM. Last LDL is controlled. ASCVD risk score is not >20%  - moderate intensity statin indicated, high intensity statin also reasonable. Aspirin is indicated.  -Continued aspirin 81 mg  -Continued Atorvastatin 80 mg.

## 2018-02-27 NOTE — Progress Notes (Signed)
Patient ID: Jeffrey [name], male   DOB: 1965-11-04, 52 y.o.   MRN: [id number] Reviewed: Agree with Dr. Graylin Shiver dcoumentation and management.

## 2018-03-13 ENCOUNTER — Ambulatory Visit (INDEPENDENT_AMBULATORY_CARE_PROVIDER_SITE_OTHER): Payer: Self-pay | Admitting: Family Medicine

## 2018-03-13 ENCOUNTER — Encounter: Payer: Self-pay | Admitting: Licensed Clinical Social Worker

## 2018-03-13 ENCOUNTER — Other Ambulatory Visit: Payer: Self-pay

## 2018-03-13 ENCOUNTER — Encounter: Payer: Self-pay | Admitting: Family Medicine

## 2018-03-13 VITALS — BP 160/110 | HR 82 | Temp 97.8°F | Ht 71.0 in | Wt 246.2 lb

## 2018-03-13 DIAGNOSIS — Z794 Long term (current) use of insulin: Secondary | ICD-10-CM

## 2018-03-13 DIAGNOSIS — N179 Acute kidney failure, unspecified: Secondary | ICD-10-CM

## 2018-03-13 DIAGNOSIS — F411 Generalized anxiety disorder: Secondary | ICD-10-CM

## 2018-03-13 DIAGNOSIS — E1140 Type 2 diabetes mellitus with diabetic neuropathy, unspecified: Secondary | ICD-10-CM

## 2018-03-13 DIAGNOSIS — I10 Essential (primary) hypertension: Secondary | ICD-10-CM

## 2018-03-13 DIAGNOSIS — F322 Major depressive disorder, single episode, severe without psychotic features: Secondary | ICD-10-CM

## 2018-03-13 DIAGNOSIS — F331 Major depressive disorder, recurrent, moderate: Secondary | ICD-10-CM

## 2018-03-13 LAB — POCT GLYCOSYLATED HEMOGLOBIN (HGB A1C): HBA1C, POC (CONTROLLED DIABETIC RANGE): 8.7 % — AB (ref 0.0–7.0)

## 2018-03-13 NOTE — Progress Notes (Signed)
CC: DM, mood  HPI   DM - a1c improved today, he is pleased. Still hasn't gotten the trulicity. Has been doing the Lantus 10U am and 10U QHS. Apidra still using with meals. He feels like eating more regularly and spliting the lantus has helped. He likes the weight he is at right now. Has had lots of lows over the last few weeks. Has blurry vision when this happens, typically 62s. When this occurs, has been typically around lunchtime after eating breakfast and taking 3U apidra with breakfast. Really doesn't eat lunch. 5U apidra nightly bc he eats large dinner.   Wt Readings from Last 3 Encounters:  03/13/18 246 lb 3.2 oz (111.7 kg)  02/26/18 242 lb 12.8 oz (110.1 kg)  02/09/18 250 lb (113.4 kg)    HTN - forgot to take his meds this am. BP occasionally high at home. Did take edarbi last night. Still with occasional lightheaded episodes, BP is low at this time.feels this happens when he "rushes to get up." Last BP that he recalls at home was 114/67.   Mood - feels the increased dose of remeron is really helping, he is finally sleeping all night. Feels the anxiety is coming back. He wanted to go off his medication in the past (paxil) due to pill burden. Currently doesn't want to go out because he feels bad things are going to happen. Copes by talking to himself. Doesn't want meds yet, wants to work on coping skills.     ROS: Denies CP, SOB, abdominal pain, dysuria, changes in BMs.   CC, SH/smoking status, and VS noted  Objective: BP (!) 160/110   Pulse 82   Temp 97.8 F (36.6 C) (Oral)   Ht 5\' 11"  (1.803 m)   Wt 246 lb 3.2 oz (111.7 kg)   SpO2 99%   BMI 34.34 kg/m  Gen: NAD, alert, cooperative, and pleasant. HEENT: NCAT, EOMI, PERRL CV: RRR, no murmur Resp: CTAB, no wheezes, non-labored Ext: No edema, warm Neuro: Alert and oriented, Speech clear, No gross deficits  Assessment and plan:  Essential hypertension BP up today, although forgot to take meds. Asymptomatic. Cannot  really increase his HTN treatment as feel still has possible orthostatic hypotension at home and has hx of passing out. Continue current dosing.   Type 2 diabetes mellitus with diabetic neuropathy, with long-term current use of insulin (HCC) We are thrilled with his a1c progress. Hopefully will get trulicity at some point soon and this could continue to help. Unfortunately, he is frequently having afternoon low blood sugars, likely secondary to not eating lunch.  Reviewed CBG meter with Dr. Valentina Lucks, will change Lantus to 8 units twice daily and increase Apidra to cover mealtime sugars.  Apidra should be 4 units with breakfast and 6 units with dinner.  Encouraged him to eat lunch when able.  Repeat A1c in 3 months, follow-up with Dr. Valentina Lucks in 6 weeks.  AKI (acute kidney injury) [facility]) Patient asks to recheck Cr today.  Major depressive disorder, recurrent episode ([place]) Continue remeron, this is helping with sleep. Now with more anxiety around leaving the house. Trying coping mechanisms, but   Anxiety state Continue Remeron, consider BuSpar as an additive therapy in the future.  Warm handoff given for coping strategies.  PHQ 9 improved, gad 7 persistently elevated at 20.  Depression Insomnia much improved, continue remeron.    Orders Placed This Encounter  Procedures  . Basic metabolic panel  . POCT glycosylated hemoglobin (Hb A1C)  No orders of the defined types were placed in this encounter.   Ralene Ok, MD, PGY2 03/13/2018 1:13 PM

## 2018-03-13 NOTE — Assessment & Plan Note (Signed)
Patient asks to recheck Cr today.

## 2018-03-13 NOTE — Assessment & Plan Note (Addendum)
We are thrilled with his a1c progress. Hopefully will get trulicity at some point soon and this could continue to help. Unfortunately, he is frequently having afternoon low blood sugars, likely secondary to not eating lunch.  Reviewed CBG meter with Dr. Valentina Lucks, will change Lantus to 8 units twice daily and increase Apidra to cover mealtime sugars.  Apidra should be 4 units with breakfast and 6 units with dinner.  Encouraged him to eat lunch when able.  Repeat A1c in 3 months, follow-up with Dr. Valentina Lucks in 6 weeks.

## 2018-03-13 NOTE — Assessment & Plan Note (Signed)
Insomnia much improved, continue remeron.

## 2018-03-13 NOTE — Patient Instructions (Addendum)
It was a pleasure to see you today! Thank you for choosing [facility] for your primary care. Jeffrey [name] was seen for DM, HTN, mood.   Our plans for today were:  DECREASE your Lantus to 8 units BID  INCREASE your morning Apidra with breakfast to 4 units  INCREASE your dinner Apidra to 6 units  Keep your blood pressure medicine the same.   Call the physical therapy office, looks like they left you a message before . [phone]  You should return to our clinic to see Dr. Valentina Lucks in 6 weeks for diabetes.   Best,  Dr. Lindell Noe

## 2018-03-13 NOTE — Progress Notes (Signed)
Type of Service: Integrated Behavioral Health Warm Handoff Total time:20 minutes :  Interpreter:No.    Jeffrey [name] is a 52 y.o. male referred by Dr. Lindell Noe for assistance with managing anxiety Patient is pleasant and engaged in conversation. Reports :concerns with stress related to his and wife's health, feeling nervious/ anxious, and negative thoughts.   Thought process: Coherent; Affect: Appropriate: No plan to harm self or others  Goals: Patient will reduce symptoms of: anxiety and stress , and increase  ability XL:KGMWNU skills, self-management skills and stress reduction, Increase healthy adjustment to current life circumstances. Intervention: Solution-Focused Strategies and Brief CBT, Reflective listening,; Psychoeducation , relaxation tech. Issues discussed: previous and current coping skills  ; intervention options that have worked in the past ;  Demonstration of relaxed breathing; positive affirmations Assessment:Patient is experiencing symptoms of anxiety which has increased over the past few months.  Symptoms exacerbated by difficulty with managing psychosocial stressor.  Patient has spoken to PCP and is not interested in medication at this time.  Patient may benefit from, and is in agreement to implement brief therapeutic interventions to assist with managing his symptoms.  Plan:  Patient will work on the plan below.  1. Patient will F/U with LCSW as needed  2. Behavioral recommendations: relaxed breathing and review positive affirmations printed today 3. Referral: none at this time   Warm Hand Off Completed.     [name], LCSW Licensed Clinical Social Worker [place]   [phone] 11:39 AM

## 2018-03-13 NOTE — Assessment & Plan Note (Addendum)
Continue remeron, this is helping with sleep. Now with more anxiety around leaving the house. Trying coping mechanisms, but

## 2018-03-13 NOTE — Assessment & Plan Note (Signed)
Continue Remeron, consider BuSpar as an additive therapy in the future.  Warm handoff given for coping strategies.  PHQ 9 improved, gad 7 persistently elevated at 20.

## 2018-03-13 NOTE — Assessment & Plan Note (Signed)
BP up today, although forgot to take meds. Asymptomatic. Cannot really increase his HTN treatment as feel still has possible orthostatic hypotension at home and has hx of passing out. Continue current dosing.

## 2018-03-14 LAB — BASIC METABOLIC PANEL
BUN / CREAT RATIO: 15 (ref 9–20)
BUN: 15 mg/dL (ref 6–24)
CALCIUM: 9.5 mg/dL (ref 8.7–10.2)
CO2: 25 mmol/L (ref 20–29)
Chloride: 101 mmol/L (ref 96–106)
Creatinine, Ser: 1.01 mg/dL (ref 0.76–1.27)
GFR, EST AFRICAN AMERICAN: 98 mL/min/{1.73_m2} (ref >59)
GFR, EST NON AFRICAN AMERICAN: 85 mL/min/{1.73_m2} (ref >59)
Glucose: 182 mg/dL — ABNORMAL HIGH (ref 65–99)
Potassium: 4.8 mmol/L (ref 3.5–5.2)
SODIUM: 141 mmol/L (ref 134–144)

## 2018-03-17 ENCOUNTER — Telehealth: Payer: Self-pay

## 2018-03-17 NOTE — Telephone Encounter (Signed)
LVM for pt to call the office. If pt calls, please give him the information below. Sharon T Saunders, CMA  

## 2018-03-17 NOTE — Telephone Encounter (Signed)
-----   Message from Sela Hilding, MD sent at 03/17/2018  9:50 AM EDT ----- [facility] team, can you let Mr. Grave know that his kidney function looks good? Labs are normal.

## 2018-03-18 ENCOUNTER — Ambulatory Visit: Payer: Medicaid Other

## 2018-03-19 NOTE — Telephone Encounter (Signed)
2nd attempt to contact pt to inform him of below. Jeffrey [name], [name], [name]

## 2018-04-08 ENCOUNTER — Ambulatory Visit: Payer: Medicaid Other

## 2018-04-10 ENCOUNTER — Other Ambulatory Visit: Payer: Self-pay | Admitting: *Deleted

## 2018-04-11 MED ORDER — DEXLANSOPRAZOLE 30 MG PO CPDR: 30.0000 mg | DELAYED_RELEASE_CAPSULE | Freq: Every day | ORAL | 3 refills | Status: DC

## 2018-04-29 ENCOUNTER — Telehealth: Payer: Self-pay | Admitting: Family Medicine

## 2018-04-29 NOTE — Telephone Encounter (Signed)
Received fax from [facility] requesting clarification of apidra dosing, patient needs refill and is using it TID rather than BID. Instructions are up to TID, to be used 5U with large meals. Adjusted Rx to state 5U with large meals TID PRN as discussed at his last visit.

## 2018-04-29 NOTE — Telephone Encounter (Signed)
Dawn, from the [place], called returning [place] phone call. Per chart review, I gave rx information as written below to [facility].

## 2018-05-08 ENCOUNTER — Encounter: Payer: Self-pay | Admitting: Family Medicine

## 2018-05-18 ENCOUNTER — Other Ambulatory Visit: Payer: Self-pay

## 2018-05-19 MED ORDER — GABAPENTIN 300 MG PO CAPS: 1200.0000 mg | ORAL_CAPSULE | Freq: Three times a day (TID) | ORAL | 3 refills | Status: DC

## 2018-06-10 ENCOUNTER — Telehealth: Payer: Self-pay | Admitting: *Deleted

## 2018-06-10 ENCOUNTER — Ambulatory Visit: Payer: Self-pay | Admitting: Neurology

## 2018-06-10 NOTE — Telephone Encounter (Signed)
No showed follow up appointment. 

## 2018-06-11 ENCOUNTER — Encounter: Payer: Self-pay | Admitting: Neurology

## 2018-06-25 ENCOUNTER — Other Ambulatory Visit: Payer: Self-pay

## 2018-06-26 MED ORDER — MIRTAZAPINE 30 MG PO TABS: 30.0000 mg | ORAL_TABLET | Freq: Every day | ORAL | 1 refills | Status: DC

## 2018-07-04 ENCOUNTER — Emergency Department (HOSPITAL_COMMUNITY)
Admission: EM | Admit: 2018-07-04 | Discharge: 2018-07-05 | Disposition: A | Discharge status: Home / Self Care | Payer: Medicaid Other | Attending: Emergency Medicine | Admitting: Emergency Medicine

## 2018-07-04 ENCOUNTER — Other Ambulatory Visit: Payer: Self-pay

## 2018-07-04 ENCOUNTER — Encounter (HOSPITAL_COMMUNITY): Payer: Self-pay | Admitting: Emergency Medicine

## 2018-07-04 DIAGNOSIS — F10921 Alcohol use, unspecified with intoxication delirium: Secondary | ICD-10-CM | POA: Insufficient documentation

## 2018-07-04 DIAGNOSIS — R739 Hyperglycemia, unspecified: Secondary | ICD-10-CM

## 2018-07-04 DIAGNOSIS — E1165 Type 2 diabetes mellitus with hyperglycemia: Secondary | ICD-10-CM | POA: Insufficient documentation

## 2018-07-04 DIAGNOSIS — I10 Essential (primary) hypertension: Secondary | ICD-10-CM | POA: Insufficient documentation

## 2018-07-04 DIAGNOSIS — G931 Anoxic brain damage, not elsewhere classified: Secondary | ICD-10-CM | POA: Insufficient documentation

## 2018-07-04 DIAGNOSIS — Z8674 Personal history of sudden cardiac arrest: Secondary | ICD-10-CM | POA: Insufficient documentation

## 2018-07-04 MED ORDER — INSULIN ASPART 100 UNIT/ML ~~LOC~~ SOLN
10.0000 [IU] | Freq: Once | SUBCUTANEOUS | Status: AC
  Administered 2018-07-05: 10 [IU] via SUBCUTANEOUS
  Filled 2018-07-04: qty 1

## 2018-07-04 MED ORDER — THIAMINE HCL 100 MG/ML IJ SOLN
Freq: Once | INTRAVENOUS | Status: AC
  Administered 2018-07-05: 01:00:00 via INTRAVENOUS
  Filled 2018-07-04: qty 1000

## 2018-07-04 MED ORDER — ONDANSETRON HCL 4 MG/2ML IJ SOLN
4.0000 mg | Freq: Once | INTRAMUSCULAR | Status: AC
  Administered 2018-07-05: 4 mg via INTRAVENOUS
  Filled 2018-07-04: qty 2

## 2018-07-04 NOTE — ED Triage Notes (Signed)
Family called EMS because family could not pick him up or arouse him. Patient family states that his sugar was high. [id number]

## 2018-07-05 LAB — URINALYSIS, ROUTINE W REFLEX MICROSCOPIC
BILIRUBIN URINE: NEGATIVE
Bacteria, UA: NONE SEEN
KETONES UR: NEGATIVE mg/dL
LEUKOCYTES UA: NEGATIVE
NITRITE: NEGATIVE
PROTEIN: 100 mg/dL — AB
Specific Gravity, Urine: 1.008 (ref 1.005–1.030)
pH: 5 (ref 5.0–8.0)

## 2018-07-05 LAB — CBC WITH DIFFERENTIAL/PLATELET
BASOS PCT: 0 %
Basophils Absolute: 0 10*3/uL (ref 0.0–0.1)
EOS PCT: 4 %
Eosinophils Absolute: 0.4 10*3/uL (ref 0.0–0.7)
HEMATOCRIT: 38.4 % — AB (ref 39.0–52.0)
Hemoglobin: 12.6 g/dL — ABNORMAL LOW (ref 13.0–17.0)
Lymphocytes Relative: 23 %
Lymphs Abs: 2.2 10*3/uL (ref 0.7–4.0)
MCH: 28.4 pg (ref 26.0–34.0)
MCHC: 32.8 g/dL (ref 30.0–36.0)
MCV: 86.7 fL (ref 78.0–100.0)
MONO ABS: 0.5 10*3/uL (ref 0.1–1.0)
MONOS PCT: 5 %
Neutro Abs: 6.5 10*3/uL (ref 1.7–7.7)
Neutrophils Relative %: 68 %
PLATELETS: 291 10*3/uL (ref 150–400)
RBC: 4.43 MIL/uL (ref 4.22–5.81)
RDW: 15.6 % — AB (ref 11.5–15.5)
WBC: 9.6 10*3/uL (ref 4.0–10.5)

## 2018-07-05 LAB — CBG MONITORING, ED
GLUCOSE-CAPILLARY: 188 mg/dL — AB (ref 70–99)
Glucose-Capillary: 302 mg/dL — ABNORMAL HIGH (ref 70–99)

## 2018-07-05 LAB — RAPID URINE DRUG SCREEN, HOSP PERFORMED
Amphetamines: NOT DETECTED
Barbiturates: NOT DETECTED
Benzodiazepines: NOT DETECTED
COCAINE: NOT DETECTED
Opiates: NOT DETECTED
TETRAHYDROCANNABINOL: NOT DETECTED

## 2018-07-05 LAB — BASIC METABOLIC PANEL
ANION GAP: 13 (ref 5–15)
BUN: 10 mg/dL (ref 6–20)
CALCIUM: 8.6 mg/dL — AB (ref 8.9–10.3)
CO2: 21 mmol/L — ABNORMAL LOW (ref 22–32)
CREATININE: 1 mg/dL (ref 0.61–1.24)
Chloride: 100 mmol/L (ref 98–111)
GFR calc non Af Amer: >60 mL/min (ref >60)
Glucose, Bld: 347 mg/dL — ABNORMAL HIGH (ref 70–99)
Potassium: 3.8 mmol/L (ref 3.5–5.1)
SODIUM: 134 mmol/L — AB (ref 135–145)

## 2018-07-05 LAB — ETHANOL: Alcohol, Ethyl (B): 368 mg/dL (ref <10)

## 2018-07-05 NOTE — ED Provider Notes (Signed)
[place] DEPT Provider Note: Georgena Spurling, MD, FACEP  CSN: [id number] MRN: [id number] ARRIVAL: 07/04/18 at [place]: [place]  Alcohol Intoxication  Level 5 caveat: Intoxicated HISTORY OF PRESENT ILLNESS  07/05/18 12:00 AM Jeffrey [name] is a 52 y.o. 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  He is here after family found him lying on the ground next to 4 empty 24 ounce beer cans.  They were unable to wake him up or move him so EMS was called.  He had to be assisted to the stretcher to come to the ED.  He was able to answer some questions to EMS but is clearly intoxicated with limited ability to give a history.  He cannot tell me why he is here.  He was noted to have a blood sugar of 332 which is not unusual for him.   Past Medical History:  Diagnosis Date  . Acute renal insufficiency 12/08/2012  . AKI (acute kidney injury) ([place])   . ALCOHOL ABUSE, HX OF 11/10/2007  . Anoxic encephalopathy ([place])   . Anxiety   . Bleeding ulcer 2014  . Blood transfusion 2014   "related to bleeding ulcer"  . Cardiac arrest ([place]) 12/07/2012   Anoxic encephalopathy  . GERD (gastroesophageal reflux disease)   . High cholesterol   . Hypertension   . OSA on CPAP 12/07/2012  . Seizure-like activity ([place])   . Type II diabetes mellitus ([place])   . Venous insufficiency 12/13/2011    Past Surgical History:  Procedure Laterality Date  . CARDIOVASCULAR STRESS TEST  03/16/13   Very Poor Exercise Tolerance; NON DIAGNOSTIC TEST  . CATARACT EXTRACTION W/ INTRAOCULAR LENS IMPLANT Left 03/07/2014   [place] @ [facility]  . ESOPHAGOGASTRODUODENOSCOPY Left 11/26/2012   Procedure: ESOPHAGOGASTRODUODENOSCOPY (EGD);  Surgeon: Wonda Horner, MD;  Location: [facility] ENDOSCOPY;  Service: Endoscopy;  Laterality: Left;  . ESOPHAGOGASTRODUODENOSCOPY N/A 10/10/2014   Procedure: ESOPHAGOGASTRODUODENOSCOPY (EGD);  Surgeon: Ladene Artist, MD;  Location: [facility] ENDOSCOPY;  Service: Endoscopy;  Laterality: N/A;  . KNEE ARTHROSCOPY Left ~ 1999    Family History  Problem Relation Age of Onset  . Other Mother        Unsure of medical history.  . Diabetes Mellitus II Father     Social History   Tobacco Use  . Smoking status: Never Smoker  . Smokeless tobacco: Current User    Types: Snuff  Substance Use Topics  . Alcohol use: Yes    Alcohol/week: 6.0 standard drinks    Types: 6 Cans of beer per week  . Drug use: No    Prior to Admission medications   Medication Sig Start Date End Date Taking? Authorizing Provider  aspirin EC 81 MG tablet Take 81 mg by mouth at bedtime.     [provider]  atorvastatin (LIPITOR) 80 MG tablet Take 1 tablet (80 mg total) by mouth at bedtime. 02/11/18   Sela Hilding, MD  Azilsartan Medoxomil (EDARBI) 40 MG TABS Take 1 tablet by mouth daily. 02/05/18   Zenia Resides, MD  DEPAKOTE ER 500 MG 24 hr tablet TAKE 1 Tablet  BY MOUTH TWICE DAILY 02/06/18   Sela Hilding, MD  Dexlansoprazole 30 MG capsule Take 1 capsule (30 mg total) by mouth daily. 04/11/18   Sela Hilding, MD  Dulaglutide (TRULICITY) 3.66 QH/4.7ML SOPN Inject 0.75 mg into the skin once a week. Patient not taking:  Reported on 02/26/2018 02/05/18   Zenia Resides, MD  fluticasone (FLONASE) 50 MCG/ACT nasal spray Place 1 spray into both nostrils daily. Patient not taking: Reported on 02/26/2018 02/09/18   Sela Hilding, MD  folic acid (FOLVITE) 1 MG tablet Take 1 tablet (1 mg total) by mouth daily. Patient taking differently: Take 1 mg by mouth at bedtime.  10/30/17   Sela Hilding, MD  gabapentin (NEURONTIN) 300 MG capsule Take 4 capsules (1,200 mg total) by mouth 3 (three) times daily. 05/19/18   Sela Hilding, MD  hydrochlorothiazide (HYDRODIURIL) 25 MG tablet Take 1 tablet (25 mg total) by mouth daily. 02/09/18   Sela Hilding, MD  insulin glargine (LANTUS) 100 unit/mL SOPN Inject 0.1  mLs (10 Units total) into the skin 2 (two) times daily. 02/05/18   Zenia Resides, MD  Insulin Glulisine (APIDRA SOLOSTAR) 100 UNIT/ML Solostar Pen Inject 3 units if blood sugar in 80-199, 5 units if blood sugar is 200-299, 7 units if blood sugar is 300 or higher with breakfast and supper 02/26/18   Zenia Resides, MD  lactulose (CHRONULAC) 10 GM/15ML solution Take 15 mLs (10 g total) by mouth daily. Patient taking differently: Take 10 g by mouth daily as needed.  01/15/18   Zenia Resides, MD  levETIRAcetam (KEPPRA) 500 MG tablet Take 1 tablet (500 mg total) by mouth 2 (two) times daily. 12/18/17   Leeanne Rio, MD  metFORMIN (GLUCOPHAGE) 500 MG tablet Take 1 tablet (500 mg total) by mouth 2 (two) times daily with a meal. 09/22/17   Mercy Riding, MD  mirtazapine (REMERON) 30 MG tablet Take 1 tablet (30 mg total) by mouth at bedtime. 06/26/18   Sela Hilding, MD  Multiple Vitamin (MULTIVITAMIN WITH MINERALS) TABS tablet Take 1 tablet by mouth at bedtime.     [provider]  naproxen (NAPROSYN) 500 MG tablet Take 1 tablet (500 mg total) by mouth 2 (two) times daily with a meal. 02/05/18   Hensel, Jamal Collin, MD  polyvinyl alcohol (LIQUIFILM TEARS) 1.4 % ophthalmic solution Place 1 drop into both eyes every 4 (four) hours as needed for dry eyes. 06/19/16   [name], DO  sildenafil (VIAGRA) 100 MG tablet Take 100 mg by mouth daily as needed for erectile dysfunction.    [provider]  thiamine (VITAMIN B-1) 100 MG tablet Take 1 tablet (100 mg total) by mouth daily. 05/27/16   [name], DO    Allergies Lisinopril   REVIEW OF SYSTEMS     PHYSICAL EXAMINATION  Initial Vital Signs Blood pressure (!) 178/114, pulse 92, temperature (!) 97.3 F (36.3 C), temperature source Oral, resp. rate 17, height 5\' 10"  (1.778 m), weight 111.1 kg, SpO2 97 %.  Examination General: Well-developed, well-nourished male in no acute distress; appearance  consistent with age of record HENT: normocephalic; atraumatic Eyes: pupils equal, round and reactive to light; right cataract, left pseudophakia Neck: supple Heart: regular rate and rhythm Lungs: clear to auscultation bilaterally Abdomen: soft; nondistended; nontender; bowel sounds present Extremities: No deformity; full range of motion; pulses normal; 1+ pitting edema of lower legs Neurologic: Somnolent but arousable; dysarthria; ataxia; motor function intact in all extremities and symmetric; no facial droop Skin: Warm and dry; eczematous changes at joint folds; chronic appearing hyperpigmentation of lower legs Psychiatric: Intoxicated   RESULTS  Summary of this visit's results, reviewed by myself:   EKG Interpretation  Date/Time:    Ventricular Rate:    PR Interval:  QRS Duration:   QT Interval:    QTC Calculation:   R Axis:     Text Interpretation:        Laboratory Studies: Results for orders placed or performed during the hospital encounter of 07/04/18 (from the past 24 hour(s))  Basic metabolic panel     Status: Abnormal   Collection Time: 07/05/18 12:12 AM  Result Value Ref Range   Sodium 134 (L) 135 - 145 mmol/L   Potassium 3.8 3.5 - 5.1 mmol/L   Chloride 100 98 - 111 mmol/L   CO2 21 (L) 22 - 32 mmol/L   Glucose, Bld 347 (H) 70 - 99 mg/dL   BUN 10 6 - 20 mg/dL   Creatinine, Ser 1.00 0.61 - 1.24 mg/dL   Calcium 8.6 (L) 8.9 - 10.3 mg/dL   GFR calc non Af Amer >60 >60 mL/min   GFR calc Af Amer >60 >60 mL/min   Anion gap 13 5 - 15  Urinalysis, Routine w reflex microscopic     Status: Abnormal   Collection Time: 07/05/18 12:12 AM  Result Value Ref Range   Color, Urine STRAW (A) YELLOW   APPearance CLEAR CLEAR   Specific Gravity, Urine 1.008 1.005 - 1.030   pH 5.0 5.0 - 8.0   Glucose, UA >=500 (A) NEGATIVE mg/dL   Hgb urine dipstick MODERATE (A) NEGATIVE   Bilirubin Urine NEGATIVE NEGATIVE   Ketones, ur NEGATIVE NEGATIVE mg/dL   Protein, ur 100 (A) NEGATIVE  mg/dL   Nitrite NEGATIVE NEGATIVE   Leukocytes, UA NEGATIVE NEGATIVE   RBC / HPF 0-5 0 - 5 RBC/hpf   Bacteria, UA NONE SEEN NONE SEEN  CBC with Differential/Platelet     Status: Abnormal   Collection Time: 07/05/18 12:12 AM  Result Value Ref Range   WBC 9.6 4.0 - 10.5 K/uL   RBC 4.43 4.22 - 5.81 MIL/uL   Hemoglobin 12.6 (L) 13.0 - 17.0 g/dL   HCT 38.4 (L) 39.0 - 52.0 %   MCV 86.7 78.0 - 100.0 fL   MCH 28.4 26.0 - 34.0 pg   MCHC 32.8 30.0 - 36.0 g/dL   RDW 15.6 (H) 11.5 - 15.5 %   Platelets 291 150 - 400 K/uL   Neutrophils Relative % 68 %   Neutro Abs 6.5 1.7 - 7.7 K/uL   Lymphocytes Relative 23 %   Lymphs Abs 2.2 0.7 - 4.0 K/uL   Monocytes Relative 5 %   Monocytes Absolute 0.5 0.1 - 1.0 K/uL   Eosinophils Relative 4 %   Eosinophils Absolute 0.4 0.0 - 0.7 K/uL   Basophils Relative 0 %   Basophils Absolute 0.0 0.0 - 0.1 K/uL  Rapid urine drug screen (hospital performed)     Status: None   Collection Time: 07/05/18 12:12 AM  Result Value Ref Range   Opiates NONE DETECTED NONE DETECTED   Cocaine NONE DETECTED NONE DETECTED   Benzodiazepines NONE DETECTED NONE DETECTED   Amphetamines NONE DETECTED NONE DETECTED   Tetrahydrocannabinol NONE DETECTED NONE DETECTED   Barbiturates NONE DETECTED NONE DETECTED  Ethanol     Status: Abnormal   Collection Time: 07/05/18 12:13 AM  Result Value Ref Range   Alcohol, Ethyl (B) 368 (HH) <10 mg/dL  CBG monitoring, ED     Status: Abnormal   Collection Time: 07/05/18 12:23 AM  Result Value Ref Range   Glucose-Capillary 302 (H) 70 - 99 mg/dL   Imaging Studies: No results found.  ED COURSE and MDM  Nursing notes  and initial vitals signs, including pulse oximetry, reviewed.  Vitals:   07/05/18 0300 07/05/18 0330 07/05/18 0530 07/05/18 0700  BP: 130/88 (!) 134/97 (!) 182/107 (!) 188/110  Pulse: 94 92 90 89  Resp: 15 12 20 18   Temp:      TempSrc:      SpO2: 94% 96% 97% 100%  Weight:      Height:       7:35 AM Patient now awake and  alert, able to carry on a conversation.  He admits not taking his medications yesterday.  He was advised to take his medications every day as instructed.  PROCEDURES    ED DIAGNOSES     ICD-10-CM   1. Alcohol intoxication with delirium ([place]) F10.921   2. Hyperglycemia R73.9        Shenekia Riess, Jenny Reichmann, MD 07/05/18 [phone]

## 2018-07-05 NOTE — ED Notes (Addendum)
Date and time results received: 07/05/18 0105   Test: Alcohol Critical Value: 368  Name of Provider Notified: [place], MD

## 2018-07-05 NOTE — ED Notes (Addendum)
Called wife Sunday Spillers at [phone] and [phone]. Wife did not answer.

## 2018-07-05 NOTE — ED Notes (Signed)
He arouses easily and is quite lucid. Dr. Florina Ou examines him and writes his d/c.

## 2018-10-26 ENCOUNTER — Telehealth: Payer: Self-pay | Admitting: Family Medicine

## 2018-10-26 NOTE — Telephone Encounter (Signed)
Called patient to invite him to [facility] clinic appt - he hasn't been seen in about 8 months and has a complex hx with complex medication regimen. He did not answer, left VM. Will route to RN team for Janett Billow to schedule him on my [facility] clinic on 1/27 if he can come then. If not, i'd still like to see him sometime soon in my regular clinic. Please remind him to bring all his medicines in both pill box and bottle.

## 2018-10-27 NOTE — Telephone Encounter (Signed)
lmovm for callback. Reyan Helle, Salome Spotted, CMA

## 2018-10-28 ENCOUNTER — Encounter: Payer: Self-pay | Admitting: Family Medicine

## 2018-11-02 ENCOUNTER — Ambulatory Visit (INDEPENDENT_AMBULATORY_CARE_PROVIDER_SITE_OTHER): Payer: Self-pay | Admitting: Family Medicine

## 2018-11-02 ENCOUNTER — Other Ambulatory Visit: Payer: Self-pay

## 2018-11-02 VITALS — BP 180/110 | HR 97 | Temp 98.4°F | Ht 70.0 in | Wt 248.6 lb

## 2018-11-02 DIAGNOSIS — Z794 Long term (current) use of insulin: Secondary | ICD-10-CM

## 2018-11-02 DIAGNOSIS — F331 Major depressive disorder, recurrent, moderate: Secondary | ICD-10-CM

## 2018-11-02 DIAGNOSIS — Z23 Encounter for immunization: Secondary | ICD-10-CM

## 2018-11-02 DIAGNOSIS — R569 Unspecified convulsions: Secondary | ICD-10-CM

## 2018-11-02 DIAGNOSIS — F1010 Alcohol abuse, uncomplicated: Secondary | ICD-10-CM

## 2018-11-02 DIAGNOSIS — E1140 Type 2 diabetes mellitus with diabetic neuropathy, unspecified: Secondary | ICD-10-CM

## 2018-11-02 LAB — POCT GLYCOSYLATED HEMOGLOBIN (HGB A1C): HbA1c, POC (controlled diabetic range): 10.5 % — AB (ref 0.0–7.0)

## 2018-11-02 NOTE — Patient Instructions (Signed)
It was a pleasure to see you today! Thank you for choosing [facility] for your primary care. Jeffrey [name] was seen for checkup.   Our plans for today were:  Great job with decreasing the alcohol and keeping your job. You are doing great!   Dr. Valentina Lucks can see you as scheduled. Please bring all of your medicines again.   Please call me if you have issues or medicine concerns.   Best,  Dr. Lindell Noe

## 2018-11-02 NOTE — Progress Notes (Signed)
CC: diabetes  HPI  This is a [facility] clinic visit shared with Dr. Erin Hearing, with the purpose of working on communication and barriers to care with patients with complicated medical history. Jeffrey [name] returns to our office after about 7 months of not having any scheduled visits.  Prior to this we have been checking on his diabetes about once a month due to fluctuating CBGs and med affordability concerns.  Diabetes: He states he had been running low on his Lantus due to the [facility] program having delayed shipments, but denies ever running out.  He has not noted any lows.  He also is very puzzled as to the fluctuating nature of his CBGs.  He said the night prior to our visit he ate quite a large dinner, and woke up with a fasting CBG this morning of 114.  He forgot to bring his meter.  He also says some days he eats only a few slices of peach and his sugar may be 400 later that afternoon despite taking his 20 units of Lantus and 3 units of Apidra.  Sleep: States he still having trouble falling asleep, sometimes having days in a row where he gets minimal sleep.  He ran out of the Remeron, and was not able to call and asked for any refills from [name].  He feels this was really helping him before and his mood seem to be improving to him as well.  Seizures - follows with Dr. [name] from neurology. He self-discontinued his keppra in September, because he felt like this was making him nauseous. Had one seizure in late September, went to the Ed. He did not call us tell us about the change he made. He says he called Dr. Rhea Belton office and was told it was ok to stop it and make a f/u appt in May.   Alcohol use -he reports he is drinking 2 or 3 beers, only on weekend days.  He does recall a history of hard alcohol abuse in the past.  He states that he made a change in his drinking behavior around August, when he learned that his disability case was going to be rejected and he would not get disability.  He states at that  time he began looking for jobs and noted that he could not be drinking during the days when he was doing interviews or working.  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  He would sit around on days when he is not working and drink relatively continuously this spring.  He feels more in control of his alcohol use at present  ROS: Denies CP, SOB, abdominal pain, dysuria, changes in BMs.   CC, SH/smoking status, and VS noted  Objective: BP (!) 180/110   Pulse 97   Temp 98.4 F (36.9 C) (Oral)   Ht 5\' 10"  (1.778 m)   Wt 248 lb 9.6 oz (112.8 kg)   SpO2 99%   BMI 35.67 kg/m  Gen: NAD, alert, cooperative, and pleasant. HEENT: NCAT, EOMI, PERRL CV: RRR, no murmur Resp: CTAB, no wheezes, non-labored Ext: No edema, warm Neuro: Alert and oriented, Speech clear, No gross deficits  Assessment and plan:  Type 2 diabetes mellitus with diabetic neuropathy, with long-term current use of insulin (HCC) Poor control today.  Unclear whether this is due to possibly missing some of his insulin or dietary indiscretions.  This patient previously did very  well with regular check ins with me and Dr. Valentina Lucks.  Instructed him to keep his same dose of Lantus and Apidra, return in 2 weeks to discuss with Dr. Valentina Lucks and bring his meter at that time.  I wonder whether he would benefit from an SGL T2 or GLP-1.  Seizure ([place]) I am concerned that he self discontinued his Keppra and had a seizure in the resulting month.  He needs to follow-up with his neurologist.  Major depressive disorder, recurrent episode ([place]) Continue Remeron.  PHQ 9 of 9 gad 7 is 4.  ETOH abuse Patient seems to have self regulated his alcohol use lately.  This is a good step towards his daily function.  Encouraged him to continue.   Orders Placed This Encounter  Procedures  . Flu Vaccine QUAD 36+ mos IM  . POCT glycosylated hemoglobin (Hb A1C)    No orders  of the defined types were placed in this encounter.  Ralene Ok, MD, PGY3 11/04/2018 10:14 AM

## 2018-11-02 NOTE — Progress Notes (Signed)
21 

## 2018-11-04 NOTE — Assessment & Plan Note (Signed)
I am concerned that he self discontinued his Keppra and had a seizure in the resulting month.  He needs to follow-up with his neurologist.

## 2018-11-04 NOTE — Assessment & Plan Note (Signed)
Continue Remeron.  PHQ 9 of 9 gad 7 is 4.

## 2018-11-04 NOTE — Assessment & Plan Note (Signed)
Patient seems to have self regulated his alcohol use lately.  This is a good step towards his daily function.  Encouraged him to continue.

## 2018-11-04 NOTE — Assessment & Plan Note (Signed)
Poor control today.  Unclear whether this is due to possibly missing some of his insulin or dietary indiscretions.  This patient previously did very well with regular check ins with me and Dr. Valentina Lucks.  Instructed him to keep his same dose of Lantus and Apidra, return in 2 weeks to discuss with Dr. Valentina Lucks and bring his meter at that time.  I wonder whether he would benefit from an SGL T2 or GLP-1.

## 2018-11-13 ENCOUNTER — Other Ambulatory Visit: Payer: Self-pay | Admitting: *Deleted

## 2018-11-13 MED ORDER — MIRTAZAPINE 30 MG PO TABS: 30.0000 mg | ORAL_TABLET | Freq: Every day | ORAL | 1 refills | Status: DC

## 2018-11-19 ENCOUNTER — Encounter: Payer: Self-pay | Admitting: Pharmacist

## 2018-11-19 ENCOUNTER — Ambulatory Visit (INDEPENDENT_AMBULATORY_CARE_PROVIDER_SITE_OTHER): Payer: Self-pay | Admitting: Pharmacist

## 2018-11-19 VITALS — BP 142/96 | HR 96 | Ht 70.0 in | Wt 242.0 lb

## 2018-11-19 DIAGNOSIS — E1140 Type 2 diabetes mellitus with diabetic neuropathy, unspecified: Secondary | ICD-10-CM

## 2018-11-19 DIAGNOSIS — I10 Essential (primary) hypertension: Secondary | ICD-10-CM

## 2018-11-19 DIAGNOSIS — Z794 Long term (current) use of insulin: Secondary | ICD-10-CM

## 2018-11-19 MED ORDER — INSULIN GLARGINE 100 UNITS/ML SOLOSTAR PEN: 16.0000 [IU] | PEN_INJECTOR | Freq: Every day | SUBCUTANEOUS | 3 refills | Status: DC

## 2018-11-19 MED ORDER — LIRAGLUTIDE 18 MG/3ML ~~LOC~~ SOPN: 0.6000 mg | PEN_INJECTOR | Freq: Every day | SUBCUTANEOUS | 2 refills | Status: DC

## 2018-11-19 MED ORDER — AZILSARTAN MEDOXOMIL 80 MG PO TABS: 80.0000 mg | ORAL_TABLET | Freq: Every day | ORAL | 2 refills | Status: DC

## 2018-11-19 NOTE — Progress Notes (Signed)
S:     Chief Complaint  Patient presents with  . Medication Management    diabetes    Patient arrives in good spirits and ambulates without assistance.  Presents for diabetes evaluation, education, and management at the request of Dr. Lindell Noe. Patient was referred on 11/02/2018.    Family/Social History: one beer ~every other day  Insurance coverage/medication affordability: [employer], [place] [facility]  Patient reports variable adherence with medications. He states he does not bring insulin with him to work where he eats a big breakfast and sometimes eats lunch. Reports he has missed insulin doses ~3 times since October 07, 2018.  Current diabetes medications include: Trulicity, Metformin 916 mg BID, Lantus 20U, Apidra (insulin glulisine) 3U with meals. Current hypertension medications include: azilsartan 40 mg  Patient reports hypoglycemic events. Usually occurs in the afternoons, with readings of 48, 52, and in the 70s.   Patient reported dietary habits: Eats 2 meals/day Breakfast: eats at work [employer]: sometimes Dinner:sometimes   Patient reports nocturia. States he urinates through sleep.  Patient reports neuropathy. Patient denies visual changes. Last ophthalmology exam about 2 years ago.   O:  Physical Exam Constitutional:      Appearance: Normal appearance.  Neurological:     Mental Status: He is alert.  Psychiatric:        Mood and Affect: Mood normal.        Behavior: Behavior normal.    Review of Systems  All other systems reviewed and are negative.    Lab Results  Component Value Date   HGBA1C 10.5 (A) 11/02/2018   There were no vitals filed for this visit.  Lipid Panel     Component Value Date/Time   CHOL 144 12/04/2017 1124   TRIG 100 12/04/2017 1124   HDL 62 12/04/2017 1124   CHOLHDL 2.3 12/04/2017 1124   CHOLHDL 4.3 05/27/2016 1123   VLDL 58 (H) 05/27/2016 1123   LDLCALC 62 12/04/2017 1124   LDLDIRECT 89 12/23/2012 1414     CBGs 48-400s   Clinical ASCVD: No The 10-year ASCVD risk score Mikey Bussing DC Jr., et al., 2013) is: 29.1%   Values used to calculate the score:     Age: 53 years     Sex: Male     Is Non-Hispanic African American: Yes     Diabetic: Yes     Tobacco smoker: No     Systolic Blood Pressure: 384 mmHg     Is BP treated: Yes     HDL Cholesterol: 62 mg/dL     Total Cholesterol: 144 mg/dL    A/P: Diabetes longstanding currently uncontrolled. Patient is able to verbalize appropriate hypoglycemia management plan. Patient reports variable adherence with medication. Control is suboptimal due to poor adherence to insulin and sometimes skipping meals.  -Decreased dose of basal insulin Lantus (insulin glargine) from 20 units to 16 units daily.  -Continued  rapid insulin Apidra (insulin glulisine). -Gave samples of GLP-1 Victoza (liraglutide). Start Victoza 0.6 mg daily.  -Increase dose of metformin from 500 mg BID to 500 mg QAM and 1000 mg with dinner. Counseled patient to self-titrate to 2000 mg daily if stomach upset is not a problem after one week.  -Extensively discussed pathophysiology of DM, recommended lifestyle interventions, dietary effects on glycemic control -Counseled on s/sx of and management of hypoglycemia -Next A1C anticipated 01/2019.   ASCVD risk - primary prevention in patient with DM. Last LDL is controlled, 62 in February 2019. ASCVD risk score is >  20%   -Continue aspirin 81 mg  -Continued atorvastatin 80 mg.   Hypertension longstanding currently uncontrolled with in-office reading of 142/96.  BP goal = 130 mmHg. Patient reports adherence with medication.  -Increase azilsartan from 40 mg to 80 mg daily.   Written patient instructions provided.  Total time in face to face counseling 60 minutes.   Follow up Pharmacist Clinic Visit in one month.   Patient seen with Emeline General, PharmD Candidate, Janae Bridgeman, PharmD, PGY1 resident and Courtney Heys, PharmD,  PGY2 Pharmacy  Resident.

## 2018-11-19 NOTE — Assessment & Plan Note (Signed)
Diabetes longstanding currently uncontrolled. Patient is able to verbalize appropriate hypoglycemia management plan. Patient reports variable adherence with medication. Control is suboptimal due to poor adherence to insulin and sometimes skipping meals.  -Decreased dose of basal insulin Lantus (insulin glargine) from 20 units to 16 units daily.  -Continued  rapid insulin Apidra (insulin glulisine). -Gave samples of GLP-1 Victoza (liraglutide). Start Victoza 0.6 mg daily.  -Increase dose of metformin from 500 mg BID to 500 mg QAM and 1000 mg with dinner. Counseled patient to self-titrate to 2000 mg daily if stomach upset is not a problem after one week.  -Extensively discussed pathophysiology of DM, recommended lifestyle interventions, dietary effects on glycemic control -Counseled on s/sx of and management of hypoglycemia -Next A1C anticipated 01/2019.

## 2018-11-19 NOTE — Patient Instructions (Addendum)
It was great to see you today!  Today your blood pressure was 142/96, which is a little higher than we like for it to be.  We are increasing your blood pressure medication dose from azilsartan 40 mg to 80 mg.   Start taking azilsartan 80 mg. Start by taking two tablets of azilsartan until you run out of what you already have. Then pick up your new prescription at your [facility].   You said that your sugars are up and down. We want to get better control over your sugars.  We are switching your Trulicity once weekly to Victoza daily.  Start taking Victoza 0.6 daily.   Increase the number of tablets you are taking of metformin.  Take one metformin tablet in the morning and two tablets with dinner. If you don't have any stomach upset after a week, take two tablets in the morning and two tablets at dinner.  Decrease Lantus to 16 units while we are making these other changes to your medications. We want to prevent you from having low blood sugar.   Follow up with [name] in the pharmacy clinic in about 4 weeks. Please give [name] a call if you have any questions or concerns.

## 2018-11-19 NOTE — Assessment & Plan Note (Signed)
Hypertension longstanding currently uncontrolled with in-office reading of 142/96.  BP goal = 130 mmHg. Patient reports adherence with medication.  -Increase azilsartan from 40 mg to 80 mg daily.

## 2018-11-20 NOTE — Progress Notes (Signed)
Patient ID: Jeffrey [name], male   DOB: Feb 10, 1966, 52 y.o.   MRN: [id number] Reviewed: Agree with Dr. Graylin Shiver documentation and management.

## 2018-11-23 ENCOUNTER — Other Ambulatory Visit: Payer: Self-pay | Admitting: Family Medicine

## 2018-11-23 DIAGNOSIS — Z794 Long term (current) use of insulin: Secondary | ICD-10-CM

## 2018-11-23 DIAGNOSIS — E119 Type 2 diabetes mellitus without complications: Secondary | ICD-10-CM

## 2018-11-23 DIAGNOSIS — E1142 Type 2 diabetes mellitus with diabetic polyneuropathy: Secondary | ICD-10-CM

## 2018-11-23 DIAGNOSIS — E1140 Type 2 diabetes mellitus with diabetic neuropathy, unspecified: Secondary | ICD-10-CM

## 2018-12-08 ENCOUNTER — Encounter: Payer: Self-pay | Admitting: Family Medicine

## 2018-12-08 ENCOUNTER — Other Ambulatory Visit: Payer: Self-pay

## 2018-12-08 ENCOUNTER — Ambulatory Visit (INDEPENDENT_AMBULATORY_CARE_PROVIDER_SITE_OTHER): Payer: Self-pay | Admitting: Family Medicine

## 2018-12-08 VITALS — BP 180/100 | HR 88 | Temp 98.0°F | Ht 70.0 in | Wt 255.0 lb

## 2018-12-08 DIAGNOSIS — E1140 Type 2 diabetes mellitus with diabetic neuropathy, unspecified: Secondary | ICD-10-CM

## 2018-12-08 DIAGNOSIS — I10 Essential (primary) hypertension: Secondary | ICD-10-CM

## 2018-12-08 DIAGNOSIS — Z794 Long term (current) use of insulin: Secondary | ICD-10-CM

## 2018-12-08 MED ORDER — AMLODIPINE BESYLATE 5 MG PO TABS: 5.0000 mg | ORAL_TABLET | Freq: Every day | ORAL | 3 refills | Status: DC

## 2018-12-08 MED ORDER — LIRAGLUTIDE 18 MG/3ML ~~LOC~~ SOPN: 1.2000 mg | PEN_INJECTOR | Freq: Every day | SUBCUTANEOUS | 2 refills | Status: DC

## 2018-12-08 MED ORDER — AMOXICILLIN 875 MG PO TABS: 875.0000 mg | ORAL_TABLET | Freq: Two times a day (BID) | ORAL | 0 refills | Status: DC

## 2018-12-08 NOTE — Patient Instructions (Addendum)
It was a pleasure to see you today! Thank you for choosing [facility] for your primary care. Jeffrey [name] was seen for DM, blood pressure.   Our plans for today were:  For your blood pressure, add the new medicine called amlodipine. STOP the HCTZ medication.   Keep using your lantus as you are.   Keep using the victoza.   Try to increase your metformin to 1 pill in the morning and 2 pills in the evening.   Increase your victoza to 2 clicks instead of 1. The dose is 1.2mg .    Best,  Dr. Lindell Noe   Diet Recommendations for Diabetes   Starchy (carb) foods: Bread, rice, pasta, potatoes, corn, cereal, grits, crackers, bagels, muffins, all baked goods.  (Fruits, milk, and yogurt also have carbohydrate, but most of these foods will not spike your blood sugar as most starchy foods will.)  A few fruits do cause high blood sugars; use small portions of bananas (limit to 1/2 at a time), grapes, watermelon, oranges, and most tropical fruits.    Protein foods: Meat, fish, poultry, eggs, dairy foods, and beans such as pinto and kidney beans (beans also provide carbohydrate).   1. Eat at least 3 meals and 1-2 snacks per day. Never go more than 4-5 hours while awake without eating. Eat breakfast within the first hour of getting up.   2. Limit starchy foods to TWO per meal and ONE per snack. ONE portion of a starchy  food is equal to the following:   - ONE slice of bread (or its equivalent, such as half of a hamburger bun).   - 1/2 cup of a "scoopable" starchy food such as potatoes or rice.   - 15 grams of Total Carbohydrate as shown on food label.  3. Include at every meal: a protein food, a carb food, and vegetables and/or fruit.   - Obtain twice the volume of veg's as protein or carbohydrate foods for both lunch and dinner.   - Fresh or frozen veg's are best.   - Keep frozen veg's on hand for a quick vegetable serving.

## 2018-12-08 NOTE — Assessment & Plan Note (Addendum)
Did not take his HCTZ as he is having nighttime urination.  I am not sure this is entirely due to his HCTZ, but we will switch to Norvasc 5 mg.  Sent this today.  Let us recheck his blood pressure in 2 weeks with his pharmacy visit.  Check BMP.

## 2018-12-08 NOTE — Assessment & Plan Note (Signed)
Patient reports improved blood sugars at home.  He is doing well on the Victoza, did not increase his metformin as requested.  He is still using the Lantus and Apidra that were discussed at his last visit.  We asked him to increase his Victoza to 1.2 mg and add a second tablet of metformin nightly if possible.  Follow-up in 2 weeks with pharmacy.

## 2018-12-08 NOTE — Progress Notes (Signed)
   CC: DM,  HTN  HPI  Patient uses [employer], states he has to renew his certification for this.  He has some paperwork or needs paper prescription, he is not sure.  He states he will bring his paperwork to his appointment with Dr. Valentina Lucks next week.  DM- lantus was decreased as requested. He has been doing apidra 3U with his largest meal. He did get the victoza and doing this in the mornings. CBGs are "getting better." some lows in the 60s, some in 200s. Seems unrelated to time of day. CBGs seem overall improved since Dr. Graylin Shiver visit. He knows he has eating habits that are affecting CBGs. He forgot to increase his metformin.   HTN - he increased his azilsartan as requested. Took it today. He hasn't been taking HCTZ due to increased nighttime urination. He would be willing to try a decreased dose of HCTZ.   Wasn't able to get patient assistance viagra.   ROS: Denies CP, SOB, abdominal pain, dysuria, changes in BMs.   CC, SH/smoking status, and VS noted  Objective: BP (!) 180/100 (BP Location: Left Arm, Patient Position: Sitting, Cuff Size: Large)   Pulse 88   Temp 98 F (36.7 C) (Oral)   Ht 5\' 10"  (1.778 m)   Wt 255 lb (115.7 kg) Comment: steel toed shoes on  SpO2 98%   BMI 36.59 kg/m  Gen: NAD, alert, cooperative, and pleasant. HEENT: NCAT, EOMI, PERRL CV: RRR, no murmur Resp: CTAB, no wheezes, non-labored Ext: No edema, warm Neuro: Alert and oriented, Speech clear, No gross deficits  Assessment and plan:  Essential hypertension Did not take his HCTZ as he is having nighttime urination.  I am not sure this is entirely due to his HCTZ, but we will switch to Norvasc 5 mg.  Sent this today.  Let us recheck his blood pressure in 2 weeks with his pharmacy visit.  Check BMP.  Type 2 diabetes mellitus with diabetic neuropathy, with long-term current use of insulin ([place]) Patient reports improved blood sugars at home.  He is doing well on the Victoza, did not increase his metformin as  requested.  He is still using the Lantus and Apidra that were discussed at his last visit.  We asked him to increase his Victoza to 1.2 mg and add a second tablet of metformin nightly if possible.  Follow-up in 2 weeks with pharmacy.   Orders Placed This Encounter  Procedures  . Basic metabolic panel    Meds ordered this encounter  Medications  . amLODipine (NORVASC) 5 MG tablet    Sig: Take 1 tablet (5 mg total) by mouth daily.    Dispense:  90 tablet    Refill:  3  . liraglutide (VICTOZA) 18 MG/3ML SOPN    Sig: Inject 0.2 mLs (1.2 mg total) into the skin daily.    Dispense:  1 pen    Refill:  2  . DISCONTD: amoxicillin (AMOXIL) 875 MG tablet    Sig: Take 1 tablet (875 mg total) by mouth 2 (two) times daily.    Dispense:  20 tablet    Refill:  0      Ralene Ok, MD, PGY3 12/08/2018 4:51 PM

## 2018-12-09 LAB — BASIC METABOLIC PANEL
BUN/Creatinine Ratio: 11 (ref 9–20)
BUN: 14 mg/dL (ref 6–24)
CO2: 22 mmol/L (ref 20–29)
CREATININE: 1.32 mg/dL — AB (ref 0.76–1.27)
Calcium: 8.3 mg/dL — ABNORMAL LOW (ref 8.7–10.2)
Chloride: 98 mmol/L (ref 96–106)
GFR calc Af Amer: 70 mL/min/{1.73_m2} (ref >59)
GFR, EST NON AFRICAN AMERICAN: 61 mL/min/{1.73_m2} (ref >59)
GLUCOSE: 160 mg/dL — AB (ref 65–99)
POTASSIUM: 4.9 mmol/L (ref 3.5–5.2)
SODIUM: 138 mmol/L (ref 134–144)

## 2018-12-24 ENCOUNTER — Telehealth: Payer: Self-pay | Admitting: Pharmacist

## 2018-12-24 ENCOUNTER — Ambulatory Visit: Payer: Medicaid Other | Admitting: Pharmacist

## 2018-12-24 ENCOUNTER — Other Ambulatory Visit: Payer: Self-pay | Admitting: Family Medicine

## 2018-12-24 DIAGNOSIS — E1142 Type 2 diabetes mellitus with diabetic polyneuropathy: Secondary | ICD-10-CM

## 2018-12-24 DIAGNOSIS — E119 Type 2 diabetes mellitus without complications: Secondary | ICD-10-CM

## 2018-12-24 DIAGNOSIS — Z794 Long term (current) use of insulin: Secondary | ICD-10-CM

## 2018-12-24 DIAGNOSIS — E1140 Type 2 diabetes mellitus with diabetic neuropathy, unspecified: Secondary | ICD-10-CM

## 2018-12-24 NOTE — Telephone Encounter (Signed)
Patient contacted to determine need for face-to-face visit today.  Patient reported doing very well.   His blood sugar readings were reported as 170 with NO low readings recently.  He attributes this to the Liraglutide and ensuring he eats a mid-day meal.   He also reported his home blood pressure readings are doing well since the addition of amlodipine at his recent PCP visit.    He sounded very happy on the phone, was appreciative of the call and was willing to continue with telephone visits for the near future.    We agreed we would talk via phone in ~ 4 weeks.

## 2019-01-08 ENCOUNTER — Other Ambulatory Visit: Payer: Self-pay | Admitting: Family Medicine

## 2019-01-08 ENCOUNTER — Other Ambulatory Visit: Payer: Self-pay

## 2019-01-08 DIAGNOSIS — E119 Type 2 diabetes mellitus without complications: Secondary | ICD-10-CM

## 2019-01-08 MED ORDER — METFORMIN HCL 500 MG PO TABS: 500.0000 mg | ORAL_TABLET | Freq: Two times a day (BID) | ORAL | 3 refills | Status: DC

## 2019-01-08 MED ORDER — GABAPENTIN 300 MG PO CAPS: 1200.0000 mg | ORAL_CAPSULE | Freq: Three times a day (TID) | ORAL | 3 refills | Status: DC

## 2019-01-12 NOTE — Telephone Encounter (Signed)
Were refills requested? I got a refill encounter but no meds in it.

## 2019-02-17 ENCOUNTER — Other Ambulatory Visit: Payer: Self-pay | Admitting: Family Medicine

## 2019-02-17 DIAGNOSIS — E119 Type 2 diabetes mellitus without complications: Secondary | ICD-10-CM

## 2019-02-17 DIAGNOSIS — E1140 Type 2 diabetes mellitus with diabetic neuropathy, unspecified: Secondary | ICD-10-CM

## 2019-02-17 DIAGNOSIS — E1142 Type 2 diabetes mellitus with diabetic polyneuropathy: Secondary | ICD-10-CM

## 2019-03-24 ENCOUNTER — Other Ambulatory Visit: Payer: Self-pay | Admitting: Family Medicine

## 2019-03-24 DIAGNOSIS — E1140 Type 2 diabetes mellitus with diabetic neuropathy, unspecified: Secondary | ICD-10-CM

## 2019-03-24 DIAGNOSIS — Z794 Long term (current) use of insulin: Secondary | ICD-10-CM

## 2019-04-06 ENCOUNTER — Other Ambulatory Visit: Payer: Self-pay | Admitting: Family Medicine

## 2019-04-06 DIAGNOSIS — E785 Hyperlipidemia, unspecified: Secondary | ICD-10-CM

## 2019-04-06 DIAGNOSIS — E119 Type 2 diabetes mellitus without complications: Secondary | ICD-10-CM

## 2019-04-06 DIAGNOSIS — E1140 Type 2 diabetes mellitus with diabetic neuropathy, unspecified: Secondary | ICD-10-CM

## 2019-04-06 DIAGNOSIS — E1169 Type 2 diabetes mellitus with other specified complication: Secondary | ICD-10-CM

## 2019-04-06 DIAGNOSIS — E1142 Type 2 diabetes mellitus with diabetic polyneuropathy: Secondary | ICD-10-CM

## 2019-04-06 DIAGNOSIS — Z794 Long term (current) use of insulin: Secondary | ICD-10-CM

## 2019-04-06 MED ORDER — ATORVASTATIN CALCIUM 80 MG PO TABS: 80.0000 mg | ORAL_TABLET | Freq: Every day | ORAL | 3 refills | Status: DC

## 2019-04-15 ENCOUNTER — Telehealth: Payer: Self-pay | Admitting: Family Medicine

## 2019-04-15 NOTE — Telephone Encounter (Signed)
**  After Hours/ Emergency Line Call**  Received a call to report that Jeffrey [name] is out of his chronic gabapentin that he takes for diabetic neuropathy. He states where he works he is unable to have his cell phone and is unable to call his doctor during the day and forgets to call when he is off so he called the emergency line. He denies any emergent symptoms, he only needs a refill of this chronic medication. He has not requested a refill through his pharmacy. Encouraged patient to call his pharmacy to request a refill per our clinic policy and if needing authorization, his pharmacy will alert [name]. Patient was agreeable to this plan.  Will forward to PCP.  Rory Percy, DO PGY-3, [facility] 04/15/2019 7:54 PM

## 2019-04-16 ENCOUNTER — Other Ambulatory Visit: Payer: Self-pay

## 2019-04-16 MED ORDER — GABAPENTIN 300 MG PO CAPS: 1200.0000 mg | ORAL_CAPSULE | Freq: Three times a day (TID) | ORAL | 3 refills | Status: DC

## 2019-04-29 ENCOUNTER — Other Ambulatory Visit: Payer: Self-pay | Admitting: *Deleted

## 2019-04-29 DIAGNOSIS — Z794 Long term (current) use of insulin: Secondary | ICD-10-CM

## 2019-04-29 DIAGNOSIS — E1140 Type 2 diabetes mellitus with diabetic neuropathy, unspecified: Secondary | ICD-10-CM

## 2019-04-30 ENCOUNTER — Telehealth: Payer: Self-pay | Admitting: *Deleted

## 2019-04-30 MED ORDER — LANTUS SOLOSTAR 100 UNIT/ML ~~LOC~~ SOPN: PEN_INJECTOR | SUBCUTANEOUS | 1 refills | Status: DC

## 2019-04-30 MED ORDER — TRULICITY 0.75 MG/0.5ML ~~LOC~~ SOAJ: SUBCUTANEOUS | 0 refills | Status: DC

## 2019-04-30 NOTE — Telephone Encounter (Signed)
-----   Message from [name], DO sent at 04/30/2019  6:49 AM EDT ----- Please schedule patient for DM appt

## 2019-04-30 NOTE — Telephone Encounter (Signed)
Contacted pt and he will call back to schedule due to having to check his work schedule.Jeffrey [name], CMA

## 2019-05-18 ENCOUNTER — Other Ambulatory Visit: Payer: Self-pay | Admitting: *Deleted

## 2019-05-18 MED ORDER — MIRTAZAPINE 30 MG PO TABS: 30.0000 mg | ORAL_TABLET | Freq: Every day | ORAL | 1 refills | Status: DC

## 2019-05-26 ENCOUNTER — Other Ambulatory Visit: Payer: Self-pay | Admitting: Family Medicine

## 2019-05-26 DIAGNOSIS — E119 Type 2 diabetes mellitus without complications: Secondary | ICD-10-CM

## 2019-05-26 DIAGNOSIS — E1140 Type 2 diabetes mellitus with diabetic neuropathy, unspecified: Secondary | ICD-10-CM

## 2019-05-26 DIAGNOSIS — Z794 Long term (current) use of insulin: Secondary | ICD-10-CM

## 2019-05-26 DIAGNOSIS — E1142 Type 2 diabetes mellitus with diabetic polyneuropathy: Secondary | ICD-10-CM

## 2019-06-02 ENCOUNTER — Encounter: Payer: Self-pay | Admitting: Family Medicine

## 2019-06-02 ENCOUNTER — Ambulatory Visit (INDEPENDENT_AMBULATORY_CARE_PROVIDER_SITE_OTHER): Payer: Self-pay | Admitting: Family Medicine

## 2019-06-02 ENCOUNTER — Other Ambulatory Visit: Payer: Self-pay

## 2019-06-02 VITALS — BP 146/92 | HR 101 | Ht 70.0 in | Wt 258.0 lb

## 2019-06-02 DIAGNOSIS — Z23 Encounter for immunization: Secondary | ICD-10-CM

## 2019-06-02 DIAGNOSIS — Z794 Long term (current) use of insulin: Secondary | ICD-10-CM

## 2019-06-02 DIAGNOSIS — E1140 Type 2 diabetes mellitus with diabetic neuropathy, unspecified: Secondary | ICD-10-CM

## 2019-06-02 DIAGNOSIS — F1010 Alcohol abuse, uncomplicated: Secondary | ICD-10-CM

## 2019-06-02 LAB — POCT GLYCOSYLATED HEMOGLOBIN (HGB A1C): Hemoglobin A1C: 9.6 % — AB (ref 4.0–5.6)

## 2019-06-02 MED ORDER — FOLIC ACID 1 MG PO TABS: 1.0000 mg | ORAL_TABLET | Freq: Every day | ORAL | 12 refills | Status: DC

## 2019-06-02 MED ORDER — NAPROXEN 500 MG PO TABS: 500.0000 mg | ORAL_TABLET | Freq: Two times a day (BID) | ORAL | 11 refills | Status: DC

## 2019-06-02 MED ORDER — VICTOZA 18 MG/3ML ~~LOC~~ SOPN: 0.6000 mg | PEN_INJECTOR | Freq: Every day | SUBCUTANEOUS | 2 refills | Status: DC

## 2019-06-02 MED ORDER — DIVALPROEX SODIUM ER 500 MG PO TB24: 500.0000 mg | ORAL_TABLET | Freq: Two times a day (BID) | ORAL | 11 refills | Status: DC

## 2019-06-02 NOTE — Patient Instructions (Signed)
It was a pleasure to see you today! Thank you for choosing [facility] for your primary care. Jeffrey [name] was seen for diabetes.  Come back to the clinic a week after you change to Victoza to see the pharmacy team.   Today we talked about your diabetes, here this test to watch and follow. 1: Start writing your blood sugar down 3 times per day and a booklet. 2: Go to the pharmacy and get the Victoza that has been written for you. 3: Do not start taking the Victoza until 1 week after your last Trulicity dose at which point you will stop taking Trulicity and change to Victoza.  This Victoza is being written for you at a lower dose than you used to take because we are transitioning and we want to make sure that you are not pushed into a dangerously low blood sugar. 4.  Want to switch to Victoza, call the office and schedule an appointment with the pharmacy team to discuss your diabetes and change your medicines as needed, they will need to see the log of her sugars in order to do this safely.   Please bring all your medications to every doctors visit   Sign up for My Chart to have easy access to your labs results, and communication with your Primary care physician.     Please check-out at the front desk before leaving the clinic.     Best,  Dr. [name] FAMILY MEDICINE RESIDENT - PGY3 06/02/2019 4:16 PM

## 2019-06-02 NOTE — Progress Notes (Signed)
    Subjective:  Jeffrey [name] is a 53 y.o. male who presents to the [facility] today with a chief complaint of diabetes management.   HPI: Type 2 diabetes mellitus with diabetic neuropathy, with long-term current use of insulin (HCC) a1c 9.6 down from 10.2, patient pleased.  Inconsistently checks CBGs but monitor shows widely ranging from low 100s to 400s.  Patient does not like trulicity and wants to go back to victoza now that he has insurance.  He also wants a patch to monitor CBGS so he will come back to schedule with pharmacy to discuss coverage for that.    Need for immunization against influenza Patient consents to flu shot  ETOH abuse Two 12oz cans per night.  Not drinking to point of intoxication, denies any binging  Objective:  Physical Exam: BP (!) 146/92   Pulse (!) 101   Ht 5\' 10"  (1.778 m)   Wt 258 lb (117 kg)   SpO2 98%   BMI 37.02 kg/m   Gen: NAD, conversing comfortably CV: regular rhythm/rate to my exam Pulm: NWOB, CTAB with no crackles, wheezes, or rhonchi MSK: no edema, cyanosis, or clubbing noted Skin: warm, dry Neuro: grossly normal, moves all extremities Psych: Normal affect and thought content  Results for orders placed or performed in visit on 06/02/19 (from the past 72 hour(s))  HgB A1c     Status: Abnormal   Collection Time: 06/02/19  4:00 PM  Result Value Ref Range   Hemoglobin A1C 9.6 (A) 4.0 - 5.6 %   HbA1c POC (<> result, manual entry)     HbA1c, POC (prediabetic range)     HbA1c, POC (controlled diabetic range)       Assessment/Plan:  Type 2 diabetes mellitus with diabetic neuropathy, with long-term current use of insulin (HCC) a1c 9.6 down from 10.2, patient pleased.  Inconsistently checks CBGs but monitor shows widely ranging from low 100s to 400s.  Patient does not like trulicity and wants to go back to victoza now that he has insurance.  He also wants a patch to monitor CBGS so he will come back to schedule with pharmacy to discuss coverage  for that.    Instructions given to patient are as follows: 1: Start writing your blood sugar down 3 times per day and a booklet. 2: Go to the pharmacy and get the Victoza that has been written for you. 3: Do not start taking the Victoza until 1 week after your last Trulicity dose at which point you will stop taking Trulicity and change to Victoza.  This Victoza is being written for you at a lower dose than you used to take because we are transitioning and we want to make sure that you are not pushed into a dangerously low blood sugar. 4.  Once you switch to Victoza, call the office and schedule an appointment with the pharmacy team to discuss your diabetes and change your medicines as needed, they will need to see the log of her sugars in order to do this safely.  Need for immunization against influenza Patient consents to flu shot  ETOH abuse Two 12oz cans per night.  Advised to reduce consumption given diagnosis of cirrhosis, patient says he will consider it  [name], [place] - PGY3 06/04/2019 7:12 AM

## 2019-06-03 ENCOUNTER — Telehealth: Payer: Self-pay

## 2019-06-03 DIAGNOSIS — E1140 Type 2 diabetes mellitus with diabetic neuropathy, unspecified: Secondary | ICD-10-CM

## 2019-06-03 MED ORDER — VICTOZA 18 MG/3ML ~~LOC~~ SOPN: 0.6000 mg | PEN_INJECTOR | Freq: Every day | SUBCUTANEOUS | 2 refills | Status: DC

## 2019-06-03 NOTE — Telephone Encounter (Signed)
3 pens sent in per [facility].

## 2019-06-03 NOTE — Telephone Encounter (Signed)
Received fax from pharmacy stating Victoza only comes in a box of two or three. The original script was sent in for one. Please advise.

## 2019-06-04 DIAGNOSIS — Z23 Encounter for immunization: Secondary | ICD-10-CM | POA: Insufficient documentation

## 2019-06-04 NOTE — Assessment & Plan Note (Signed)
Patient consents to flu shot

## 2019-06-04 NOTE — Assessment & Plan Note (Signed)
Two 12oz cans per night.  Advised to reduce consumption given diagnosis of cirrhosis, patient says he will consider it

## 2019-06-04 NOTE — Assessment & Plan Note (Signed)
a1c 9.6 down from 10.2, patient pleased.  Inconsistently checks CBGs but monitor shows widely ranging from low 100s to 400s.  Patient does not like trulicity and wants to go back to victoza now that he has insurance.  He also wants a patch to monitor CBGS so he will come back to schedule with pharmacy to discuss coverage for that.    Instructions given to patient are as follows: 1: Start writing your blood sugar down 3 times per day and a booklet. 2: Go to the pharmacy and get the Victoza that has been written for you. 3: Do not start taking the Victoza until 1 week after your last Trulicity dose at which point you will stop taking Trulicity and change to Victoza.  This Victoza is being written for you at a lower dose than you used to take because we are transitioning and we want to make sure that you are not pushed into a dangerously low blood sugar. 4.  Once you switch to Victoza, call the office and schedule an appointment with the pharmacy team to discuss your diabetes and change your medicines as needed, they will need to see the log of her sugars in order to do this safely.

## 2019-06-23 ENCOUNTER — Telehealth: Payer: Self-pay | Admitting: Pharmacist

## 2019-06-23 NOTE — Telephone Encounter (Signed)
Spoke with patient on 06/23/19 at 11:27 AM.  Pt's medication insurance is through [employer]. Insurance information listed below. BIN: [id number] PCN: none Group: [id number] Member ID: [id number] Called plan and spoke with representative. Depending on this specific plan patient is offered a discount on the medication. Tier 1 - $10 discount. Tier 2 - $20 discount. Tier 3 - $40 discount.   Explained pt's plan to him and informed him that we can discuss insurance plans in depth at appt to determine future options because this is not a feasible long-term insurance plan for him.  Discussed with patient about PAP to help provide financial assistance regarding DM meds. Patient makes ~$20,000 per year with a family household size of 5; therefore will qualify for PAP. Instructed pt to bring in recent paychecks and [place] tax form to attach to PAP application; pt verbalized understanding. Plan to pursue [employer], which will provide assistance for Basaglar and Trulicity. Patient was previously on Apidra..[employer] PAP also offers humalog..will determine if pt needs Humalog at appt on 9/17. Metformin is on $4 list at [facility].   Pt has previously asked about possibility of Freestyle Libre. Unfortunately this is not a likely option due to cost -- expensive with his current insurance plan and there is no PAP available. Plan to discuss options for BG meters at appt as well since pt does not currently have a BG meter.

## 2019-06-24 ENCOUNTER — Ambulatory Visit (INDEPENDENT_AMBULATORY_CARE_PROVIDER_SITE_OTHER): Payer: No Typology Code available for payment source | Admitting: Pharmacist

## 2019-06-24 ENCOUNTER — Other Ambulatory Visit: Payer: Self-pay

## 2019-06-24 DIAGNOSIS — I10 Essential (primary) hypertension: Secondary | ICD-10-CM

## 2019-06-24 DIAGNOSIS — E1140 Type 2 diabetes mellitus with diabetic neuropathy, unspecified: Secondary | ICD-10-CM

## 2019-06-24 DIAGNOSIS — E1169 Type 2 diabetes mellitus with other specified complication: Secondary | ICD-10-CM

## 2019-06-24 DIAGNOSIS — E785 Hyperlipidemia, unspecified: Secondary | ICD-10-CM

## 2019-06-24 DIAGNOSIS — Z794 Long term (current) use of insulin: Secondary | ICD-10-CM

## 2019-06-24 MED ORDER — DEXLANSOPRAZOLE 30 MG PO CPDR: 30.0000 mg | DELAYED_RELEASE_CAPSULE | Freq: Every day | ORAL | Status: DC

## 2019-06-24 MED ORDER — FOLIC ACID 1 MG PO TABS: 1.0000 mg | ORAL_TABLET | Freq: Every day | ORAL | 3 refills | Status: DC

## 2019-06-24 MED ORDER — AZILSARTAN MEDOXOMIL 80 MG PO TABS: 80.0000 mg | ORAL_TABLET | Freq: Every day | ORAL | 2 refills | Status: DC

## 2019-06-24 MED ORDER — CLONIDINE HCL 0.1 MG PO TABS
0.1000 mg | ORAL_TABLET | Freq: Once | ORAL | Status: AC
  Administered 2019-06-24: 17:00:00 0.1 mg via ORAL

## 2019-06-24 MED ORDER — DIVALPROEX SODIUM ER 500 MG PO TB24: 500.0000 mg | ORAL_TABLET | Freq: Two times a day (BID) | ORAL | 3 refills | Status: DC

## 2019-06-24 MED ORDER — AZILSARTAN MEDOXOMIL 80 MG PO TABS: 80.0000 mg | ORAL_TABLET | Freq: Every day | ORAL | Status: DC

## 2019-06-24 MED ORDER — NAPROXEN 500 MG PO TABS: 500.0000 mg | ORAL_TABLET | Freq: Two times a day (BID) | ORAL | 3 refills | Status: DC

## 2019-06-24 MED ORDER — ASPIRIN EC 81 MG PO TBEC: 81.0000 mg | DELAYED_RELEASE_TABLET | Freq: Every day | ORAL | 3 refills | Status: DC

## 2019-06-24 MED ORDER — AMLODIPINE BESYLATE 5 MG PO TABS: 5.0000 mg | ORAL_TABLET | Freq: Every day | ORAL | 3 refills | Status: DC

## 2019-06-24 MED ORDER — GABAPENTIN 300 MG PO CAPS: 1200.0000 mg | ORAL_CAPSULE | Freq: Three times a day (TID) | ORAL | 3 refills | Status: DC

## 2019-06-24 MED ORDER — ATORVASTATIN CALCIUM 80 MG PO TABS: 80.0000 mg | ORAL_TABLET | Freq: Every day | ORAL | 3 refills | Status: DC

## 2019-06-24 MED ORDER — METFORMIN HCL ER 500 MG PO TB24: 1000.0000 mg | ORAL_TABLET | Freq: Two times a day (BID) | ORAL | 3 refills | Status: DC

## 2019-06-24 MED ORDER — MIRTAZAPINE 30 MG PO TABS: 30.0000 mg | ORAL_TABLET | Freq: Every day | ORAL | 3 refills | Status: DC

## 2019-06-24 NOTE — Patient Instructions (Addendum)
It was a pleasure meeting you today.  After our discussion today our plans it to:  -Increase your metformin dose. You have been taking 1 tablet by mouth twice daily. Please start taking 2 tablets every morning with a meal for 1 week then if you are tolerating that then begin taking 2 tablets by mouth twice daily with meals.  -Remember to take your Trulicity every week. You can keep one of the pens out of the refridgerator for 14 days. Place one near your medication box so that you will see it and remember to take it. At the next visit, we would like to increase the dose of this medication if you are still tolerating the Trulicity.  -We [facility] you [facility] prescriptions to [facility]. You can pick these up at your convenience.   -Please take your blood pressure medications at normal time even though you took a blood pressure medication today in the office.   -We are switching your Edarbi (azilsartan) to losartan 100mg  daily  -Please try to limit your use of Naproxen for pain if possible  Please call to schedule a follow-up visit with Dr. Valentina Lucks in 2 weeks.

## 2019-06-24 NOTE — Progress Notes (Signed)
S:     Chief Complaint  Patient presents with  . Medication Management    Diabetes   Patient arrives at clinic today alone, ambulating without assistance and in a pleasant, calm mood.  Presents for diabetes evaluation, education, and management. Patient was referred and last seen by Primary Care Provider on 8/26. He has been seen in Rx clinic many times but has not been seen recently.   Patient reports Diabetes was diagnosed in 2001.   Family/Social History: none  Insurance coverage/medication affordability: [employer]  Patient reports adherence with medications except his Trulicity since it is dosed weekly Current diabetes medications include: Lantus 20 units daily, Apidra 2-4 units BID w/ meals, and Trulicity (dulaglutide) 0.75mg  weekly Current hypertension medications include: Edarbi (azilsartan) 80mg  daily, amlodipine 5mg  daily,  Current hyperlipidemia medications include: atorvastatin 80mg  *Pt often forgets to take the Trulicity weekly  Patient denies hypoglycemic events.  Patient-reported exercise habits: works a very physically demanding job (walks about 4 miles per day)   Patient reports nocturia.  Patient reports neuropathy but it is improving. Patient denies visual changes. Patient reports self foot exams.    O:  Physical Exam Constitutional:      Appearance: Normal appearance.  Pulmonary:     Effort: Pulmonary effort is normal.  Musculoskeletal:     Right lower leg: Edema present.     Left lower leg: Edema present.     Comments: 2+ bilaterally  Neurological:     Mental Status: He is alert.  Psychiatric:        Mood and Affect: Mood normal.        Behavior: Behavior normal.        Thought Content: Thought content normal.    Review of Systems  All other systems reviewed and are negative.    Lab Results  Component Value Date   HGBA1C 9.6 (A) 06/02/2019   Vitals:   06/24/19 1629 06/24/19 1651  BP: (!) 171/100 (!) 170/98  Pulse: 96   SpO2: 100%      Lipid Panel     Component Value Date/Time   CHOL 144 12/04/2017 1124   TRIG 100 12/04/2017 1124   HDL 62 12/04/2017 1124   CHOLHDL 2.3 12/04/2017 1124   CHOLHDL 4.3 05/27/2016 1123   VLDL 58 (H) 05/27/2016 1123   LDLCALC 62 12/04/2017 1124   LDLDIRECT 89 12/23/2012 1414    Patient has not been able to check his BG lately because he has not had access to a meter. His meter stopped working for him about 3 weeks ago.   Clinical ASCVD: Yes  The 10-year ASCVD risk score Mikey Bussing DC Jr., et al., 2013) is: 27.6%   Values used to calculate the score:     Age: 28 years     Sex: Male     Is Non-Hispanic African American: Yes     Diabetic: Yes     Tobacco smoker: No     Systolic Blood Pressure: 836 mmHg     Is BP treated: Yes     HDL Cholesterol: 62 mg/dL     Total Cholesterol: 144 mg/dL   A/P: Diabetes longstanding currently uncontrolled. Patient is able to verbalize appropriate hypoglycemia management plan. Patient is adherent with medication for the most part aside from his Trulicity. Control is suboptimal due to cost of medications and medication adherence. -Continued basal insulin Lantus (insulin glargine) 20 units daily -Continued  rapid insulin Apidra (insulin glulisine) 2-4 units BID with meals.  -Increased metformin  to 1,000mg  BID -Continued GLP-1 Trulicity (dulaglutide) 0.75mg  weekly. Will consider increasing to 1.5mg  weekly next visit.  -Extensively discussed pathophysiology of DM, recommended lifestyle interventions, dietary effects on glycemic control -Discussed at length ways for him to remember taking his Trulicity weekly and pt verbalized understanding and is willing to try some new methods to be adherent.   Patient's prescriptions will be sent to [facility] aside from the ones he gets from medication assistance programs. This will help him get his medications at a lower cost. We also sent a blood glucose monitor )Trumetric with PRN refills for 3x  daily testing.  [facility] pharmacy will supply all meds except  Insulin, GLP, dexliant and azilsartan New Rxs sent for all.  Patient will pick up on 9/18  ASCVD risk - secondary prevention in patient with DM. Last LDL is controlled. ASCVD risk score is >20%  - high intensity statin indicated. Aspirin is indicated.  -Continued aspirin 81 mg  -Continued atorvastatin 80 mg.   Hypertension longstanding currently uncontrolled > 170/100 on two measurements, in patient who reports adherence with medication.  BP goal = 140/90 mmHg.  Clonidine was administered in office today and -Will discuss blood pressure management further at next visit. Due to LE edema did not increase amlodipine dose today. Consider addition of spironolactone for BP at next visit.   Labs anticipated at next visit:  CMET, Lipid panel, urine protein eval, ammonia (no longer taking lactulose), ? INR  Written patient instructions provided.  Total time in face to face counseling 90 minutes.   Follow up Pharmacist Clinic Visit in 2 weeks.   Patient seen with Gerre Pebbles PGY1 Pharmacy Resident, Drexel Iha PGY2 Pharmacy Resident, and Dr. Nicholas Lose.

## 2019-06-25 ENCOUNTER — Encounter: Payer: Self-pay | Admitting: Pharmacist

## 2019-06-25 MED FILL — ATORVASTATIN 80 MG TABLET: 80 | 30 days supply | Qty: 30 | Fill #0

## 2019-06-25 MED FILL — AMLODIPINE BESYLATE 5 MG TA: 5 | 90 days supply | Qty: 90 | Fill #0

## 2019-06-25 MED FILL — TRUE METRIX TEST STRIP: 25 days supply | Qty: 100 | Fill #0

## 2019-06-25 MED FILL — !TRUE METRIX BLOOD GLUCOSE: 1 days supply | Qty: 1 | Fill #0

## 2019-06-25 MED FILL — DIVALPROEX SOD ER 500 MG TA: 500 | 30 days supply | Qty: 60 | Fill #0

## 2019-06-25 MED FILL — NAPROXEN 500 MG TABLET: 500 | 30 days supply | Qty: 30 | Fill #0

## 2019-06-25 MED FILL — GABAPENTIN 300 MG CAPSULE: 300 | 30 days supply | Qty: 360 | Fill #0

## 2019-06-25 MED FILL — METFORMIN HCL ER 500 MG TB2: 500 | 30 days supply | Qty: 120 | Fill #0

## 2019-06-25 MED FILL — TRUEplus LANCETS 28G MISC: 25 days supply | Qty: 100 | Fill #0

## 2019-06-25 MED FILL — MIRTAZAPINE 30 MG TABLET: 30 | 30 days supply | Qty: 30 | Fill #0

## 2019-06-25 NOTE — Assessment & Plan Note (Signed)
Hypertension longstanding currently uncontrolled > 170/100 on two measurements, in patient who reports adherence with medication.  BP goal = 140/90 mmHg.  Clonidine was administered in office today and -Will discuss blood pressure management further at next visit. Due to LE edema did not increase amlodipine dose today. Consider addition of spironolactone for BP at next visit.

## 2019-06-25 NOTE — Progress Notes (Signed)
Reviewed: Agree with Dr. Koval's documentation and management. 

## 2019-06-25 NOTE — Assessment & Plan Note (Signed)
Diabetes longstanding currently uncontrolled. Patient is able to verbalize appropriate hypoglycemia management plan. Patient is adherent with medication for the most part aside from his Trulicity. Control is suboptimal due to cost of medications and medication adherence. -Continued basal insulin Lantus (insulin glargine) 20 units daily -Continued  rapid insulin Apidra (insulin glulisine) 2-4 units BID with meals.  -Increased metformin to 1,000mg  BID -Continued GLP-1 Trulicity (dulaglutide) 0.75mg  weekly. Will consider increasing to 1.5mg  weekly next visit.  -Extensively discussed pathophysiology of DM, recommended lifestyle interventions, dietary effects on glycemic control -Discussed at length ways for him to remember taking his Trulicity weekly and pt verbalized understanding and is willing to try some new methods to be adherent.

## 2019-07-01 ENCOUNTER — Telehealth: Payer: Self-pay | Admitting: *Deleted

## 2019-07-01 NOTE — Telephone Encounter (Signed)
[facility] needs to know if pt is taking Victoza and trulicity.  Christen Bame, CMA

## 2019-07-01 NOTE — Addendum Note (Signed)
Addended by: Ellwood Handler on: 07/01/2019 02:23 PM   Modules accepted: Orders

## 2019-07-01 NOTE — Telephone Encounter (Signed)
Spoke with Dawn at on 07/01/19 at 2:15 PM. Clarified pt is taking Trulicity and is not taking Victoza. Took Victoza off medication list.

## 2019-07-05 ENCOUNTER — Other Ambulatory Visit: Payer: Self-pay

## 2019-07-05 MED ORDER — DEXLANSOPRAZOLE 30 MG PO CPDR: 30.0000 mg | DELAYED_RELEASE_CAPSULE | Freq: Every day | ORAL | Status: DC

## 2019-07-12 ENCOUNTER — Telehealth: Payer: Self-pay | Admitting: Pharmacist

## 2019-07-12 ENCOUNTER — Other Ambulatory Visit: Payer: Self-pay | Admitting: Family Medicine

## 2019-07-12 DIAGNOSIS — E1142 Type 2 diabetes mellitus with diabetic polyneuropathy: Secondary | ICD-10-CM

## 2019-07-12 DIAGNOSIS — E119 Type 2 diabetes mellitus without complications: Secondary | ICD-10-CM

## 2019-07-12 DIAGNOSIS — E1140 Type 2 diabetes mellitus with diabetic neuropathy, unspecified: Secondary | ICD-10-CM

## 2019-07-12 NOTE — Telephone Encounter (Signed)
Called pt on 07/12/19 at 11:40 AM and left HIPAA compliant VM with instructions to call back Family Medicine clinic. (unsuccessful attempt #2)  Called pt on 07/08/19 at 11:07 AM and left HIPAA compliant VM with instructions to call back Family Medicine clinic. (unsuccessful attempt #1)   Would like to schedule f/u DM and HTN appt with pharmacy

## 2019-07-13 ENCOUNTER — Telehealth: Payer: Self-pay | Admitting: Pharmacist

## 2019-07-13 MED ORDER — LANTUS SOLOSTAR 100 UNIT/ML ~~LOC~~ SOPN: 20.0000 [IU] | PEN_INJECTOR | Freq: Every morning | SUBCUTANEOUS | 0 refills | Status: DC

## 2019-07-13 NOTE — Addendum Note (Signed)
Addended by: Christen Bame D on: 07/13/2019 09:11 AM   Modules accepted: Orders

## 2019-07-13 NOTE — Telephone Encounter (Signed)
HD calls to verify lantus instructions.  Had a sig of 20 units daily and 10 units BID.  Spoke with [name].  Script should read 20 units daily.  Called Dawn @ [facility] and changed in chart. Christen Bame, CMA

## 2019-07-13 NOTE — Telephone Encounter (Signed)
Called pt on 07/13/19 at 12:05 PM.   Pt states he has been unable to make f/u HTN/DM appt due how his boss has been on vacation. He anticipates his boss to return to work on Monday and will call to schedule a f/u appt sometime next week. He confirms he has the clinic phone number. He states he has been able to pick up medications from pharmacy. He reports adherence to DM regimen (Lantus, Apidra, metformin, Trulicity) and his HTN regimen (azilsartan and amlodipine). He states he takes his Trulicity on Fridays and has been able to remember based on adherence tools discussed with pharmacy team at last appt. He has been recording his BP readings daily and BG readings about twice daily. His BP is typically ~180/105 mmHg and is blood glucose readings range between 118-200 depending on the time of the day. Pt is still experiencing swelling in lower extremities. Stressed the importance of scheduling a f/u office visit with pharmacy team as soon as possible based on extremely elevated blood pressure readings and how pt needs to be assessed before initiation of new blood pressure medication. Pt verbalized understanding. F/u with pt next week to ensure scheduling of office visit appt.

## 2019-07-20 ENCOUNTER — Telehealth: Payer: Self-pay | Admitting: Pharmacist

## 2019-07-20 NOTE — Telephone Encounter (Signed)
Called pt on 07/20/19 at 10:21 AM and left HIPAA-compliant VM with instruction to call back pharmacy phone number at [facility] Medicine clinic.  F/u with pt since PharmD appt has not been scheduled. Concerned for pt considering extremely elevated home BP readings.   Will try to call pt again later today.

## 2019-07-21 ENCOUNTER — Telehealth: Payer: Self-pay | Admitting: Pharmacist

## 2019-07-21 NOTE — Telephone Encounter (Signed)
Called pt on 07/21/19 at 1:21 PM and left HIPAA-compliant VM with instructions to call back pharmacist phone number at [facility] Medicine clinic.   F/u with pt since PharmD appt has not been scheduled. Concerned for pt considering extremely elevated home BP readings.

## 2019-07-21 NOTE — Telephone Encounter (Signed)
Called pt on 07/21/19 at 3:22 PM and left HIPAA-compliant VM with instructions to call back pharmacist phone number at [facility] Medicine clinic.   F/u with pt since PharmD appt has not been scheduled. Concerned for pt considering extremely elevated home BP readings.

## 2019-07-23 ENCOUNTER — Other Ambulatory Visit: Payer: Self-pay | Admitting: *Deleted

## 2019-07-23 ENCOUNTER — Telehealth: Payer: Self-pay | Admitting: Pharmacist

## 2019-07-23 MED ORDER — DEXLANSOPRAZOLE 30 MG PO CPDR: 30.0000 mg | DELAYED_RELEASE_CAPSULE | Freq: Every day | ORAL | 3 refills | Status: DC

## 2019-07-23 NOTE — Telephone Encounter (Signed)
Patient called in follow-up of repeated requests to return call.   States he is doing OK.   No major issues identified.   He requested a new Rx sent to [facility] for his Dexilant (dexlansoprazole).  I agreed and provided new Rx.   Asked to bring all medications to visit on 10/22, scheduled during phone call.

## 2019-07-26 NOTE — Telephone Encounter (Signed)
Noted and agree. 

## 2019-07-29 ENCOUNTER — Other Ambulatory Visit: Payer: Self-pay

## 2019-07-29 ENCOUNTER — Ambulatory Visit (INDEPENDENT_AMBULATORY_CARE_PROVIDER_SITE_OTHER): Payer: No Typology Code available for payment source | Admitting: Pharmacist

## 2019-07-29 ENCOUNTER — Encounter: Payer: Self-pay | Admitting: Pharmacist

## 2019-07-29 DIAGNOSIS — Z794 Long term (current) use of insulin: Secondary | ICD-10-CM

## 2019-07-29 DIAGNOSIS — E1140 Type 2 diabetes mellitus with diabetic neuropathy, unspecified: Secondary | ICD-10-CM

## 2019-07-29 DIAGNOSIS — I10 Essential (primary) hypertension: Secondary | ICD-10-CM

## 2019-07-29 MED ORDER — NAPROXEN 500 MG PO TABS: 500.0000 mg | ORAL_TABLET | Freq: Two times a day (BID) | ORAL | 3 refills | Status: DC

## 2019-07-29 MED ORDER — INDAPAMIDE 2.5 MG PO TABS: 2.5000 mg | ORAL_TABLET | Freq: Every day | ORAL | 3 refills | Status: DC

## 2019-07-29 MED FILL — INDAPAMIDE 2.5 MG TABLET: 2.5 | 30 days supply | Qty: 30 | Fill #0

## 2019-07-29 MED FILL — NAPROXEN 500 MG TABLET: 500 | 30 days supply | Qty: 60 | Fill #1

## 2019-07-29 NOTE — Patient Instructions (Signed)
Great to see you today!  No changes in your insulin/ diabetes medicine today.   Please try to use less salt in your diet.  Please start indapamide 2.5mg  once daily.   Please schedule a virtual visit with [name] in 4-5 weeks.

## 2019-07-29 NOTE — Assessment & Plan Note (Signed)
Diabetes longstanding relatively controlled on current medications.  Two readings < 100 (87, 94) reported to be from not eating lunch  7 day average 203 14 day average 276 Patient reported high blood sugars due to not having an insulin supply, but is now more controlled due to acquiring insulin - encouraged patient to call the clinic if he ever runs out of insulin supply so we can provide some  -Continued insulin glargine (Lantus) 20 units injected SQ QAM -Continued insulin (glulisine) Apidra 2-4 units injected SQ BID with large meals -Continue metformin 500 mg PO BID -Continue Trulicity (dulaglutide) 0.75 mg injected SQ once weekly -Counseled patient to call the clinic if he has any more blood sugar readings <100, specifically <70. Patient is knowledgeable about how to treat a low blood sugar reading and is able to tell when his blood sugar is low.

## 2019-07-29 NOTE — Assessment & Plan Note (Signed)
Hypertension longstanding uncontrolled on current medications. BP Goal = <130/80 mmHg. Patient is adherent with current medications.  -Started indapamide 2.5 mg PO once daily.  -Continued amlodipine 5 mg PO daily. -Continued azilsartan 80 mg PO daily. -Encouraged to decrease any salt intake within his diet - referred to clinic nutritionist -Counseled on lifestyle modifications for blood pressure control including reduced dietary sodium, increased exercise, adequate sleep

## 2019-07-29 NOTE — Progress Notes (Signed)
S:    Patient arrives ambulating well and in good spirits.    Presents to the clinic for hypertension evaluation, counseling, and management.  Patient was referred and last seen by Primary Care Provider on 06/02/2019.    Patient reports adherence with medications. Reports new method of remembering to take Trulicity (keeping out of fridge) has helped him to remember taking it   Current BP Medications include:  Azilsartan 80 mg PO once daily, amlodipine 5 mg PO once daily  Antihypertensives tried in the past include: HCTZ 25 mg PO once daily, metoprolol 100 mg PO BID, olmesartan 40 mg PO once daily, losartan 50 mg PO once daily  Dietary habits include: instant oatmeal, sandwich, grilled chicken or beef, mac and cheese, garlic salt  Exercise habits include:walks about 4 miles at work    O:  Physical Exam Constitutional:      Appearance: Normal appearance. He is obese.  Cardiovascular:     Rate and Rhythm: Normal rate.  Musculoskeletal:     Right lower leg: Edema present.     Left lower leg: Edema present.     Comments: 2+   Neurological:     Mental Status: He is alert.    Review of Systems  Cardiovascular: Positive for leg swelling.    Home BP readings: 157/101 at home with wrist meter   Last 3 Office BP readings: BP Readings from Last 3 Encounters:  07/29/19 (!) 144/96  06/24/19 (!) 170/98  06/02/19 (!) 146/92    BMET    Component Value Date/Time   NA 138 12/08/2018 1628   K 4.9 12/08/2018 1628   CL 98 12/08/2018 1628   CO2 22 12/08/2018 1628   GLUCOSE 160 (H) 12/08/2018 1628   GLUCOSE 347 (H) 07/05/2018 0012   BUN 14 12/08/2018 1628   CREATININE 1.32 (H) 12/08/2018 1628   CREATININE 1.00 06/19/2016 1601   CALCIUM 8.3 (L) 12/08/2018 1628   CALCIUM 8.8 10/20/2007 0535   GFRNONAA 61 12/08/2018 1628   GFRNONAA 86 06/19/2016 1601   GFRAA 70 12/08/2018 1628   GFRAA >89 06/19/2016 1601    Renal function: Estimated CrCl is ~85 ml/min with Creatinine of 1.32  measured in 3/20. eGFR 70 CrCl cannot be calculated (Patient's most recent lab result is older than the maximum 21 days allowed.).  Clinical ASCVD: No  The 10-year ASCVD risk score Mikey Bussing DC Jr., et al., 2013) is: 21%   Values used to calculate the score:     Age: 53 years     Sex: Male     Is Non-Hispanic African American: Yes     Diabetic: Yes     Tobacco smoker: No     Systolic Blood Pressure: 502 mmHg     Is BP treated: Yes     HDL Cholesterol: 62 mg/dL     Total Cholesterol: 144 mg/dL   A/P: Hypertension longstanding uncontrolled on current medications. BP Goal = <130/80 mmHg. Patient is adherent with current medications.  -Started indapamide 2.5 mg PO once daily.  -Continued amlodipine 5 mg PO daily. -Continued azilsartan 80 mg PO daily. -Encouraged to decrease any salt intake within his diet - referred to clinic nutritionist  -Counseled on lifestyle modifications for blood pressure control including reduced dietary sodium, increased exercise, adequate sleep  Diabetes longstanding relatively controlled on current medications.  Two readings < 100 (87, 94) reported to be from not eating lunch  7 day average 203 14 day average 276 Patient reported high blood sugars  due to not having an insulin supply, but is now more controlled due to acquiring insulin - encouraged patient to call the clinic if he ever runs out of insulin supply so we can provide some  -Continued insulin glargine (Lantus) 20 units injected SQ QAM -Continued insulin (glulisine) Apidra 2-4 units injected SQ BID with large meals -Continue metformin 500 mg PO BID -Continue Trulicity (dulaglutide) 0.75 mg injected SQ once weekly -Counseled patient to call the clinic if he has any more blood sugar readings <100, specifically <70. Patient is knowledgeable about how to treat a low blood sugar reading and is able to tell when his blood sugar is low.    Results reviewed and written information provided.   Total time in  face-to-face counseling 20 minutes.    F/U Virtual Visit with [name] in 4-5 weeks.  Patient seen with Simonne Come, PharmD Candidate and Eddie Candle, PharmD PGY-1 Resident.

## 2019-07-30 NOTE — Progress Notes (Signed)
Reviewed: I agree with the documentation and management of Dr. Koval. 

## 2019-08-10 ENCOUNTER — Other Ambulatory Visit: Payer: Self-pay | Admitting: Family Medicine

## 2019-08-10 DIAGNOSIS — E1142 Type 2 diabetes mellitus with diabetic polyneuropathy: Secondary | ICD-10-CM

## 2019-08-10 DIAGNOSIS — Z794 Long term (current) use of insulin: Secondary | ICD-10-CM

## 2019-08-10 DIAGNOSIS — E119 Type 2 diabetes mellitus without complications: Secondary | ICD-10-CM

## 2019-08-10 DIAGNOSIS — E1140 Type 2 diabetes mellitus with diabetic neuropathy, unspecified: Secondary | ICD-10-CM

## 2019-08-13 MED FILL — ATORVASTATIN 80 MG TABLET: 80 | 30 days supply | Qty: 30 | Fill #1

## 2019-08-16 ENCOUNTER — Other Ambulatory Visit: Payer: Self-pay | Admitting: Family Medicine

## 2019-08-16 DIAGNOSIS — E1140 Type 2 diabetes mellitus with diabetic neuropathy, unspecified: Secondary | ICD-10-CM

## 2019-08-16 DIAGNOSIS — Z794 Long term (current) use of insulin: Secondary | ICD-10-CM

## 2019-08-17 ENCOUNTER — Other Ambulatory Visit: Payer: Self-pay | Admitting: Family Medicine

## 2019-08-17 ENCOUNTER — Encounter: Payer: Self-pay | Admitting: Family Medicine

## 2019-08-17 ENCOUNTER — Ambulatory Visit (INDEPENDENT_AMBULATORY_CARE_PROVIDER_SITE_OTHER): Payer: No Typology Code available for payment source | Admitting: Family Medicine

## 2019-08-17 ENCOUNTER — Other Ambulatory Visit: Payer: Self-pay

## 2019-08-17 DIAGNOSIS — E7800 Pure hypercholesterolemia, unspecified: Secondary | ICD-10-CM

## 2019-08-17 DIAGNOSIS — I10 Essential (primary) hypertension: Secondary | ICD-10-CM

## 2019-08-17 DIAGNOSIS — E1140 Type 2 diabetes mellitus with diabetic neuropathy, unspecified: Secondary | ICD-10-CM | POA: Diagnosis not present

## 2019-08-17 DIAGNOSIS — Z713 Dietary counseling and surveillance: Secondary | ICD-10-CM

## 2019-08-17 DIAGNOSIS — Z794 Long term (current) use of insulin: Secondary | ICD-10-CM

## 2019-08-17 NOTE — Patient Instructions (Addendum)
.   Diet Recommendations for Diabetes  Carbohydrate includes starch, sugar, and fiber.  Of these, only sugar and starch raise blood glucose.  (Fiber is found in fruits, vegetables [especially skin, seeds, and stalks] and whole grains.)   Starchy (carb) foods: Bread, rice, pasta, potatoes, corn, cereal, grits, crackers, bagels, muffins, all baked goods.  (Fruit, milk, and yogurt also have carbohydrate, but most of these foods will not spike your blood sugar as most starchy foods will.)  A few fruits do cause high blood sugars; use small portions of bananas (limit to 1/2 at a time), grapes, watermelon, oranges, and most tropical fruits.   Protein foods: Meat, fish, poultry, eggs, dairy foods, and beans such as pinto and kidney beans (beans also provide carbohydrate).   The recommendations below may not be possible to do now, but give some thought to how you might be able to move in this direction.   1. Eat at least 3 or 4 times per day. Aim for eating something at least every 5 hours during the day. 2. Limit starchy foods to TWO per meal and ONE per snack. ONE portion of a starchy food is equal to the following:   - ONE slice of bread (or its equivalent, such as half of a hamburger bun).   - 1/2 cup of a "scoopable" starchy food such as potatoes or rice.   - 15 grams of Total Carbohydrate as shown on food label.   - Every 4 ounces of a sweet drink (including fruit juice). 3. Eat at least one serving of a vegetable each day.    I am concerned that you are not obtaining enough energy from your diet.  This will not be sustainable for you, and it may actually contribute to slowing your metabolism, which will make it harder to continue losing weight and will certainly make it impossible to meet your nutrition needs.    As you mentioned today, the key to making changes toward the above recommendations will be to plan ahead.  A good place to start is to make a list of groceries to buy.  Use the Meal Planning  Form to determine some meals and snacks you can make/take for work.  Once you have made some meal plans, use these as a basis for shopping so you can make any of the meals with ingredients you keep on hand.    For a lower-glycemic breakfast, use 1 package of flavored (sweetened) oatmeal mixed with one package of UNsweetened oatmeal.    Keep in mind that all good health (and good nutrition) lies not in the ALL-OR-NONE, but in the middle ground of moderation!  This means we need to eat with regularity, but with moderation of what we eat and how much we eat.

## 2019-08-17 NOTE — Progress Notes (Signed)
Medical Nutrition Therapy  Assessment:  Primary concerns today: Blood sugar control and h/o MI, hyperlipidemia, hypertension.  [name] works as a Catering manager (works on Investment banker, operational) at [employer] and [employer].  He has been working 12 hrs a day 7 days a week since May d/t so many retirements with the pandemic.  For the past few weeks, he has been severely restricting, limiting intake to eating 2 X day, usually getting only ~1000 kcal/day.  Diet hx is corroborated by recent consistently low FBG of 71-86.  On 10/31, with a  BG of 71, he became symptomatic, so had some chocolate mints.  He doesn't usually address low BG levels unless he is also symptomatic.    Learning Readiness: Contemplating; patient not sure he can manage changes recommended in light of his demanding work hours.    Usual eating pattern: 2 "meals" and 0 snacks per day. Frequent foods and beverages: water, low-sugar juices, 2% milk, beer; noodles, deli meat sandwiches. Quit eating fruit recently b/c BG rose 30 pts by 30 min after eating a peach.  (I tried to explain that this is not a BG spike; that post-meal BG checks should be 2-3 hrs after eating, and a BG rise is normal within 30 min of eating.) Avoided foods: Usually avoids chicken (doesn't like it).   Usual physical activity: Walks 4-5 mi/day at work. Sleep: Estimates he gets 4-6 hrs/night.    24-hr recall: (Up at 4:30 AM) B (6:30 AM)-   16 oz 2% milk, 2 pks flavored instant meal Snk ( AM)-   --- L (1 PM)-  4 oz salmon, 1/2 c wild rice, 16 oz 2% milk  Snk ( PM)-  water D ( PM)-  --- Snk ( PM)-  --- Typical day? Yes.     Nutritional Diagnosis:  NI-1.4 Inadequate energy intake As related to energy needs.  As evidenced by usually eating only 2 X day and diet hx that suggests usual intake of  ~1000 kcal/day.  Handouts given during visit include:  After-Visit Summary (AVS)  Meal Planning Form  Barriers to learning/adherence to lifestyle change: Excessive time  demands of work (12-hour shifts 7 days a week).  Mr. Spagnolo expressed much doubt that he can make any of the recommended changes, but said he will give some thought to this.  He recognizes that advance planning will be key to success, and also acknowledges that his current restriction is unsustainable.    Monitoring/Evaluation:  Dietary intake, exercise, FBG, and body weight will not be possible d/t insurance not covering more than one preventive appt per year.

## 2019-08-25 MED FILL — TRUE METRIX TEST STRIP: 25 days supply | Qty: 100 | Fill #1

## 2019-08-26 MED FILL — DIVALPROEX SOD ER 500 MG TA: 500 | 30 days supply | Qty: 60 | Fill #1

## 2019-08-26 MED FILL — GABAPENTIN 300 MG CAPSULE: 300 | 30 days supply | Qty: 360 | Fill #1

## 2019-08-30 ENCOUNTER — Telehealth: Payer: Self-pay | Admitting: Pharmacist

## 2019-08-30 NOTE — Telephone Encounter (Signed)
Called pt on 08/30/2019 at 12:00 PM.  Patient states he has been unable to schedule an appt with [name], due to his insurance will not cover any appointments (office visit or virtual visits) for the remainder of the year.  Patient states his BP readings are usually ~140/80 mmHg and since initiation of indapamide his lower extremity edema has resolved. He states his BG readings are ~84-86 in the morning and 150-170 later on in the day. He has not experienced any episodes of hypoglycemia or hypotension. Plan to continue HTN and DM regimens. Instructed pt to reach out to Dr. Valentina Lucks or I at [facility] clinic if BP readings increase to >160/90 mmHg or BG readings increase to >200 as we can adjust medication dosing in the meantime until his insurance is able to provide coverage for office/virtual visits in 2021. Pt expressed appreciation for the call and verbalized understanding.  Plan to follow up with patient 10/2019

## 2019-09-08 MED FILL — INDAPAMIDE 2.5 MG TABLET: 2.5 | 30 days supply | Qty: 30 | Fill #1

## 2019-09-13 ENCOUNTER — Other Ambulatory Visit: Payer: Self-pay | Admitting: Family Medicine

## 2019-09-13 DIAGNOSIS — E119 Type 2 diabetes mellitus without complications: Secondary | ICD-10-CM

## 2019-09-13 DIAGNOSIS — Z794 Long term (current) use of insulin: Secondary | ICD-10-CM

## 2019-09-13 DIAGNOSIS — E1142 Type 2 diabetes mellitus with diabetic polyneuropathy: Secondary | ICD-10-CM

## 2019-09-15 MED FILL — MIRTAZAPINE 30 MG TABLET: 30 | 30 days supply | Qty: 30 | Fill #1

## 2019-09-15 MED FILL — AMLODIPINE BESYLATE 5 MG TA: 5 | 90 days supply | Qty: 90 | Fill #1

## 2019-09-24 MED FILL — ATORVASTATIN 80 MG TABLET: 80 | 30 days supply | Qty: 30 | Fill #2

## 2019-09-28 MED FILL — ?NAPROXEN 500 MG TABS: 500 | 30 days supply | Qty: 60 | Fill #2

## 2019-10-07 ENCOUNTER — Other Ambulatory Visit: Payer: Self-pay | Admitting: Family Medicine

## 2019-10-07 DIAGNOSIS — E1140 Type 2 diabetes mellitus with diabetic neuropathy, unspecified: Secondary | ICD-10-CM

## 2019-10-07 DIAGNOSIS — Z794 Long term (current) use of insulin: Secondary | ICD-10-CM

## 2019-10-15 MED FILL — ?MIRTAZAPINE 30 MG TABLET: 30 | 30 days supply | Qty: 30 | Fill #2

## 2019-10-15 MED FILL — DIVALPROEX SOD ER 500 MG TA: 500 | 30 days supply | Qty: 60 | Fill #2

## 2019-10-15 MED FILL — GABAPENTIN 300 MG CAPSULE: 300 | 30 days supply | Qty: 360 | Fill #2

## 2019-11-03 ENCOUNTER — Other Ambulatory Visit: Payer: Self-pay | Admitting: Family Medicine

## 2019-11-03 DIAGNOSIS — E1140 Type 2 diabetes mellitus with diabetic neuropathy, unspecified: Secondary | ICD-10-CM

## 2019-11-03 DIAGNOSIS — E1142 Type 2 diabetes mellitus with diabetic polyneuropathy: Secondary | ICD-10-CM

## 2019-11-03 DIAGNOSIS — E119 Type 2 diabetes mellitus without complications: Secondary | ICD-10-CM

## 2019-11-03 MED FILL — ATORVASTATIN 80 MG TABLET: 80 | 30 days supply | Qty: 30 | Fill #3

## 2019-11-03 MED FILL — INDAPAMIDE 2.5 MG TABLET: 2.5 | 30 days supply | Qty: 30 | Fill #2

## 2019-11-10 ENCOUNTER — Other Ambulatory Visit: Payer: Self-pay | Admitting: Family Medicine

## 2019-11-10 DIAGNOSIS — I10 Essential (primary) hypertension: Secondary | ICD-10-CM

## 2019-11-15 ENCOUNTER — Telehealth: Payer: Self-pay | Admitting: Pharmacist

## 2019-11-15 NOTE — Telephone Encounter (Signed)
Called patient at cell phone and home phone number on 11/15/2019 at 5:50 PM. Left HIPAA-compliant VM at both phone numbers with instructions to call Family Medicine clinic back.   Plan to discuss HTN and DM management.   Will re-attempt to contact patient in 1 month.   Drexel Iha, PharmD PGY2 Ambulatory Care Pharmacy Resident

## 2019-11-17 MED FILL — ?MIRTAZAPINE 30 MG TABLET: 30 | 30 days supply | Qty: 30 | Fill #3

## 2019-11-26 MED FILL — TRUE METRIX TEST STRIP: 25 days supply | Qty: 100 | Fill #2

## 2019-12-06 ENCOUNTER — Other Ambulatory Visit: Payer: Self-pay | Admitting: Family Medicine

## 2019-12-06 MED FILL — metFORMIN HCL ER 500 MG TB2: 500 | 30 days supply | Qty: 120 | Fill #1

## 2019-12-06 MED FILL — GABAPENTIN 300 MG CAPSULE: 300 | 30 days supply | Qty: 360 | Fill #3

## 2019-12-06 MED FILL — ?NAPROXEN 500 MG TABS: 500 | 30 days supply | Qty: 60 | Fill #3

## 2019-12-06 MED FILL — DIVALPROEX SOD ER 500 MG TA: 500 | 30 days supply | Qty: 60 | Fill #3

## 2019-12-06 MED FILL — ATORVASTATIN 80 MG TABLET: 80 | 30 days supply | Qty: 30 | Fill #4

## 2019-12-07 ENCOUNTER — Other Ambulatory Visit: Payer: Self-pay | Admitting: Family Medicine

## 2019-12-07 DIAGNOSIS — E119 Type 2 diabetes mellitus without complications: Secondary | ICD-10-CM

## 2019-12-07 DIAGNOSIS — E1142 Type 2 diabetes mellitus with diabetic polyneuropathy: Secondary | ICD-10-CM

## 2019-12-07 DIAGNOSIS — E1140 Type 2 diabetes mellitus with diabetic neuropathy, unspecified: Secondary | ICD-10-CM

## 2019-12-10 ENCOUNTER — Telehealth: Payer: Self-pay | Admitting: Pharmacist

## 2019-12-10 NOTE — Telephone Encounter (Signed)
Called patient on 12/10/2019 at 2:58 PM. Unable to leave HIPAA-compliant VM with instructions to call [facility] clinic back since VM full  Plan to discuss DM and HTN management. Follow up again in 1 month.  Thank you for involving pharmacy to assist in providing this patient's care.   Drexel Iha, PharmD PGY2 Ambulatory Care Pharmacy Resident

## 2019-12-16 MED FILL — INDAPAMIDE 2.5 MG TABLET: 2.5 | 30 days supply | Qty: 30 | Fill #3

## 2019-12-16 MED FILL — ?MIRTAZAPINE 30 MG TABLET: 30 | 30 days supply | Qty: 30 | Fill #4

## 2020-01-04 MED FILL — DIVALPROEX SOD ER 500 MG TA: 500 | 30 days supply | Qty: 60 | Fill #4

## 2020-01-04 MED FILL — ATORVASTATIN 80 MG TABLET: 80 | 30 days supply | Qty: 30 | Fill #5

## 2020-01-04 MED FILL — ?NAPROXEN 500 MG TABS: 500 | 15 days supply | Qty: 30 | Fill #4

## 2020-01-12 ENCOUNTER — Other Ambulatory Visit: Payer: Self-pay | Admitting: Family Medicine

## 2020-01-12 DIAGNOSIS — E1140 Type 2 diabetes mellitus with diabetic neuropathy, unspecified: Secondary | ICD-10-CM

## 2020-01-12 DIAGNOSIS — Z794 Long term (current) use of insulin: Secondary | ICD-10-CM

## 2020-01-12 DIAGNOSIS — E1142 Type 2 diabetes mellitus with diabetic polyneuropathy: Secondary | ICD-10-CM

## 2020-01-12 DIAGNOSIS — E119 Type 2 diabetes mellitus without complications: Secondary | ICD-10-CM

## 2020-01-14 ENCOUNTER — Telehealth: Payer: Self-pay | Admitting: Pharmacist

## 2020-01-14 NOTE — Telephone Encounter (Signed)
Called patient on 01/14/2020 at 12:07 PM and left HIPAA-compliant VM with instructions to call [facility] clinic back regarding HTN and DM management.   Unsuccessful attempt #3  Will await for patient to contact clinic at this time.  Thank you for involving pharmacy to assist in providing this patient's care.   Drexel Iha, PharmD PGY2 Ambulatory Care Pharmacy Resident

## 2020-01-18 MED FILL — ?MIRTAZAPINE 30 MG TABLET: 30 | 30 days supply | Qty: 30 | Fill #5

## 2020-01-20 ENCOUNTER — Ambulatory Visit (INDEPENDENT_AMBULATORY_CARE_PROVIDER_SITE_OTHER): Payer: No Typology Code available for payment source | Admitting: Pharmacist

## 2020-01-20 ENCOUNTER — Other Ambulatory Visit: Payer: Self-pay

## 2020-01-20 DIAGNOSIS — E1140 Type 2 diabetes mellitus with diabetic neuropathy, unspecified: Secondary | ICD-10-CM

## 2020-01-20 DIAGNOSIS — I10 Essential (primary) hypertension: Secondary | ICD-10-CM

## 2020-01-20 DIAGNOSIS — Z794 Long term (current) use of insulin: Secondary | ICD-10-CM

## 2020-01-20 DIAGNOSIS — E7800 Pure hypercholesterolemia, unspecified: Secondary | ICD-10-CM

## 2020-01-20 DIAGNOSIS — R569 Unspecified convulsions: Secondary | ICD-10-CM | POA: Diagnosis not present

## 2020-01-20 MED ORDER — OZEMPIC (0.25 OR 0.5 MG/DOSE) 2 MG/1.5ML ~~LOC~~ SOPN: 0.2500 mg | PEN_INJECTOR | SUBCUTANEOUS | 0 refills | Status: DC

## 2020-01-20 MED ORDER — EDARBI 80 MG PO TABS: 80.0000 mg | ORAL_TABLET | Freq: Every day | ORAL | 3 refills | Status: DC

## 2020-01-20 MED ORDER — SPIRONOLACTONE 25 MG PO TABS: 25.0000 mg | ORAL_TABLET | Freq: Every day | ORAL | 3 refills | Status: DC

## 2020-01-20 MED ORDER — GABAPENTIN 300 MG PO CAPS: 900.0000 mg | ORAL_CAPSULE | Freq: Four times a day (QID) | ORAL | 3 refills | Status: DC

## 2020-01-20 MED ORDER — NAPROXEN 500 MG PO TABS: 500.0000 mg | ORAL_TABLET | Freq: Two times a day (BID) | ORAL | 3 refills | Status: DC

## 2020-01-20 MED FILL — ?NAPROXEN 500 MG TABS: 500 | 30 days supply | Qty: 60 | Fill #0

## 2020-01-20 MED FILL — GABAPENTIN 300 MG CAPSULE: 300 | 30 days supply | Qty: 360 | Fill #0

## 2020-01-20 NOTE — Patient Instructions (Addendum)
Great to see you today.    I hope we make progress with today's adjustments.   For your blood pressure, continue all current blood pressure medications and  START Spironolactone 25mg  once daily at night.    For your diabetes, STOP Trulicity and  START OZEMPIC 0.25mg  once weekly (Friday).  I hope you tolerate this better.    Refills for Naproxen, Edarbi and Neurotin.    See [name] next.  I look forward to seeing you again in May or June.

## 2020-01-21 ENCOUNTER — Other Ambulatory Visit: Payer: Self-pay

## 2020-01-21 ENCOUNTER — Telehealth: Payer: Self-pay | Admitting: Family Medicine

## 2020-01-21 ENCOUNTER — Encounter: Payer: Self-pay | Admitting: Family Medicine

## 2020-01-21 ENCOUNTER — Encounter: Payer: Self-pay | Admitting: Pharmacist

## 2020-01-21 ENCOUNTER — Ambulatory Visit (HOSPITAL_COMMUNITY)
Admission: RE | Admit: 2020-01-21 | Discharge: 2020-01-21 | Disposition: A | Discharge status: Home / Self Care | Payer: No Typology Code available for payment source | Source: Ambulatory Visit | Attending: Family Medicine | Admitting: Family Medicine

## 2020-01-21 ENCOUNTER — Ambulatory Visit (INDEPENDENT_AMBULATORY_CARE_PROVIDER_SITE_OTHER): Payer: No Typology Code available for payment source | Admitting: Family Medicine

## 2020-01-21 VITALS — BP 132/68 | HR 104 | Wt 282.4 lb

## 2020-01-21 DIAGNOSIS — E1140 Type 2 diabetes mellitus with diabetic neuropathy, unspecified: Secondary | ICD-10-CM

## 2020-01-21 DIAGNOSIS — E875 Hyperkalemia: Secondary | ICD-10-CM

## 2020-01-21 DIAGNOSIS — R569 Unspecified convulsions: Secondary | ICD-10-CM | POA: Diagnosis not present

## 2020-01-21 DIAGNOSIS — Z794 Long term (current) use of insulin: Secondary | ICD-10-CM

## 2020-01-21 DIAGNOSIS — K7030 Alcoholic cirrhosis of liver without ascites: Secondary | ICD-10-CM | POA: Diagnosis not present

## 2020-01-21 LAB — LIPID PANEL
Chol/HDL Ratio: 2.1 ratio (ref 0.0–5.0)
Cholesterol, Total: 158 mg/dL (ref 100–199)
HDL: 75 mg/dL (ref >39)
LDL Chol Calc (NIH): 68 mg/dL (ref 0–99)
Triglycerides: 77 mg/dL (ref 0–149)
VLDL Cholesterol Cal: 15 mg/dL (ref 5–40)

## 2020-01-21 LAB — CBC
Hematocrit: 37.9 % (ref 37.5–51.0)
Hemoglobin: 11.8 g/dL — ABNORMAL LOW (ref 13.0–17.7)
MCH: 27.6 pg (ref 26.6–33.0)
MCHC: 31.1 g/dL — ABNORMAL LOW (ref 31.5–35.7)
MCV: 89 fL (ref 79–97)
Platelets: 209 10*3/uL (ref 150–450)
RBC: 4.27 x10E6/uL (ref 4.14–5.80)
RDW: 17.7 % — ABNORMAL HIGH (ref 11.6–15.4)
WBC: 5.6 10*3/uL (ref 3.4–10.8)

## 2020-01-21 LAB — COMPREHENSIVE METABOLIC PANEL
ALT: 38 IU/L (ref 0–44)
ALT: 41 U/L (ref 0–44)
AST: 80 IU/L — ABNORMAL HIGH (ref 0–40)
AST: 67 U/L — ABNORMAL HIGH (ref 15–41)
Albumin/Globulin Ratio: 1.3 (ref 1.2–2.2)
Albumin: 4 g/dL (ref 3.8–4.9)
Albumin: 3.3 g/dL — ABNORMAL LOW (ref 3.5–5.0)
Alkaline Phosphatase: 160 IU/L — ABNORMAL HIGH (ref 39–117)
Alkaline Phosphatase: 134 U/L — ABNORMAL HIGH (ref 38–126)
Anion gap: 9 (ref 5–15)
BUN/Creatinine Ratio: 10 (ref 9–20)
BUN: 15 mg/dL (ref 6–24)
BUN: 15 mg/dL (ref 6–20)
Bilirubin Total: 0.3 mg/dL (ref 0.0–1.2)
CO2: 24 mmol/L (ref 20–29)
CO2: 27 mmol/L (ref 22–32)
Calcium: 8.9 mg/dL (ref 8.7–10.2)
Calcium: 9 mg/dL (ref 8.9–10.3)
Chloride: 98 mmol/L (ref 96–106)
Chloride: 100 mmol/L (ref 98–111)
Creatinine, Ser: 1.54 mg/dL — ABNORMAL HIGH (ref 0.76–1.27)
Creatinine, Ser: 1.55 mg/dL — ABNORMAL HIGH (ref 0.61–1.24)
GFR calc Af Amer: 58 mL/min/{1.73_m2} — ABNORMAL LOW (ref >59)
GFR calc Af Amer: 58 mL/min — ABNORMAL LOW (ref >60)
GFR calc non Af Amer: 50 mL/min/{1.73_m2} — ABNORMAL LOW (ref >59)
GFR calc non Af Amer: 50 mL/min — ABNORMAL LOW (ref >60)
Globulin, Total: 3.1 g/dL (ref 1.5–4.5)
Glucose, Bld: 261 mg/dL — ABNORMAL HIGH (ref 70–99)
Glucose: 306 mg/dL — ABNORMAL HIGH (ref 65–99)
Potassium: 6.5 mmol/L — ABNORMAL HIGH (ref 3.5–5.2)
Potassium: 5.5 mmol/L — ABNORMAL HIGH (ref 3.5–5.1)
Sodium: 138 mmol/L (ref 134–144)
Sodium: 136 mmol/L (ref 135–145)
Total Bilirubin: 0.7 mg/dL (ref 0.3–1.2)
Total Protein: 7.1 g/dL (ref 6.0–8.5)
Total Protein: 7 g/dL (ref 6.5–8.1)

## 2020-01-21 NOTE — Assessment & Plan Note (Signed)
ASCVD risk - secondary prevention in patient with diabetes. Last LDL was controlled but > 1 year prior. ASCVD risk score is >20%  - high intensity statin indicated. Aspirin is indicated. Obtained lipid panel today.   -Continued aspirin 81 mg  -Continued atorvastatin 80 mg.  - Resulted 4/16 - 68 LDL and 158 Total Cholerstrol  No change in therapy

## 2020-01-21 NOTE — Patient Instructions (Addendum)
It was a pleasure to see you today! Thank you for choosing [facility] for your primary care. Jeffrey [name] was seen for potential high potassium. Come back to the clinic for your regularly scheduled visit with me next week.  I will call you this weekend if your labs require changing medication for then.  Today we called you because your last labs showed a very concerning high potassium, there is a chance that this is an accurate so we are going to redraw this to confirm before we start treatment.  Luckily your EKG showed no significant changes but if you develop any trouble breathing or chest pain or feeling like your heart is racing is important that you get to the physician immediately.  We have canceled the order at the pharmacy but please do not take any Spironolactone until you hear from myself or from Dr. Valentina Lucks that it would be important to do so.  We are going to put in a referral to neurology so that he can go back and speak to the physicians who scheduled your seizure plan and we did order a lab check for your seizure medication.  We are going to try and get the ultrasound scheduled for you but please do not forget that this is something you need to get done at some point.     Please bring all your medications to every doctors visit   Sign up for My Chart to have easy access to your labs results, and communication with your Primary care physician.     Please check-out at the front desk before leaving the clinic.     Best,  Dr. [name] FAMILY MEDICINE RESIDENT - PGY3 01/21/2020 4:43 PM

## 2020-01-21 NOTE — Assessment & Plan Note (Signed)
Hypertension longstanding and currently with poor control despite three drug regimen.  Blood pressure goal = < 130/80 mmHg. Patient medication adherence appears good.  Blood pressure control is suboptimal due to inadequate drug regimen, stress, inactivity, obesity. - Continue Azilsartan, amlodipine 5mg  (had edema with 10mg ) and indapamide at current doses.   - Initiate spironolactone 25mg  once daily QHS.    STOPPED spirnolatone prescription 4/16 - called directly to pharmacy to stop dispensing.

## 2020-01-21 NOTE — Progress Notes (Signed)
S:     Chief Complaint  Patient presents with  . Medication Management    Diabetes / Hypertension    Patient arrives in good spirits.  He ambulates without assistance.  Upon arrival I apolgized and thanked for his patience as he had arrived early and was seen later than his scheduled appointment time. Presents for diabetes evaluation, education, and management Patient was referred and last seen by Primary Care Provider on 06/02/2019.  Patient has been seen in Rx Clinic many times over the last several years.    Family/Social History: Stable, wife and children are good.  Job is keeping his very busy.   Insurance coverage/medication affordability: [place]. MAP.   Patient reports adherence with medications and medication supply, verified by pill bottles review.  Current Diabetes medications include: Trulicity (dulaglutide), Metformin Current hypertension medications include: Azilsartan, amlodipine 40m, and indapamide 2.570mCurrent hyperlipidemia medications include: atorvastatin 8081mPatient denies hypoglycemic events. Patient reports significant diarrhea for 1-2 days after trulicity shot.    Patient reported dietary habits: Eats 3 meals/day Patient-reported exercise habits: limited to walking at work   Patient denies nocturia (nighttime urination).  Patient denies neuropathy (nerve pain).   O:  Physical Exam Vitals reviewed.  Constitutional:      Appearance: Normal appearance.  Pulmonary:     Effort: Pulmonary effort is normal.  Neurological:     Mental Status: He is alert.  Psychiatric:        Thought Content: Thought content normal.        Judgment: Judgment normal.      Review of Systems  Gastrointestinal: Positive for diarrhea.  Genitourinary: Negative for frequency and urgency.  Neurological: Negative for seizures.     Lab Results  Component Value Date   HGBA1C 9.6 (A) 06/02/2019   Vitals:   01/20/20  1621  BP: (!) 158/98  Pulse: 95  SpO2: 96%    Lipid Panel     Component Value Date/Time   CHOL 144 12/04/2017 1124   TRIG 100 12/04/2017 1124   HDL 62 12/04/2017 1124   CHOLHDL 2.3 12/04/2017 1124   CHOLHDL 4.3 05/27/2016 1123   VLDL 58 (H) 05/27/2016 1123   LDLCALC 62 12/04/2017 1124   LDLDIRECT 89 12/23/2012 1414    Home fasting blood sugars: 170-210   2 hour post-meal/random blood sugars: ~ 200.   Clinical Atherosclerotic Cardiovascular Disease (ASCVD): Yes  The 10-year ASCVD risk score (GoMikey Bussing Jr., et al., 2013) is: 24.5%   Values used to calculate the score:     Age: [age]     Sex: Male     Is Non-Hispanic African American: Yes     Diabetic: Yes     Tobacco smoker: No     Systolic Blood Pressure: 158712Hg     Is BP treated: Yes     HDL Cholesterol: 62 mg/dL     Total Cholesterol: 144 mg/dL    A/P: Diabetes longstanding and currently under fairly good control however diarrhea attributed to Trulicity is significant. . Patient is able to verbalize appropriate hypoglycemia management plan. Patient appears adherent with medication. Control is suboptimal due to multiple factors including stress, and obesity. -Continued basal insulin Lantus (insulin glargine) at 20 units daily -Continued  rapid insulin Apidra (insulin glulisine) at 4 units prior to two largest meals of the day..  -Discontinued GLP-1 Trulicity and started Ozempic (generic name semaglutide) at 0.72m26mce weekly. Reassess for  improved tolerability vs Trulicity at upcoming visit.  Consider dose adjustment in the near future. Sample medication provided. If this is tolerated, he will need new application at [facility] MAP/ [facility] pharmacy assistance.   -Extensively discussed pathophysiology of diabetes, recommended lifestyle interventions, dietary effects on blood sugar control -Counseled on s/sx of and management of hypoglycemia -Next A1C anticipated at next PCP visit.   ASCVD risk - secondary prevention in  patient with diabetes. Last LDL was controlled but > 1 year prior. ASCVD risk score is >20%  - high intensity statin indicated. Aspirin is indicated. Obtained lipid panel today.   -Continued aspirin 81 mg  -Continued atorvastatin 80 mg.  - Resulted 4/16 - 68 LDL and 158 Total Cholerstrol  No change in therapy   Hypertension longstanding and currently with poor control despite three drug regimen.  Blood pressure goal = < 130/80 mmHg. Patient medication adherence appears good.  Blood pressure control is suboptimal due to inadequate drug regimen, stress, inactivity, obesity. - Continue Azilsartan, amlodipine 67m (had edema with 177m and indapamide at current doses.   - Initiate spironolactone 2543mnce daily QHS.  STOPPED 4/16 - called directly to pharmacy to stop dispensing.   Seizure disorder - stable, no reported seizures in over 1 year with 1000m16m divalproex daily.  No change in therapy today. Obtained CMET and CMC for valproic acid monitoring.   Written patient instructions provided.  Total time in face to face counseling 35 minutes.   Follow up Pharmacist - PRN (before the end of June) and PCP clinic visit 4/22.     Labs RETURNED - AM 4/16 Scr increased to 1.54 Potassium 6.5 AST increased to 80  Alk Phos elevated  And  Hemoglobin reduced.   [facility] prescription for [facility]one.  Discussed the above results with Dr. BlanCriss Rosales he contacted wife and hopes to have patient scheduled into clinic today with him.

## 2020-01-21 NOTE — Assessment & Plan Note (Signed)
Seizure disorder - stable, no reported seizures in over 1 year with '1000mg'$  of divalproex daily.  No change in therapy today. Obtained CMET and CMC for valproic acid monitoring.  4/16 - AST and Alk Phos increased PCP notified. Patient contacted.

## 2020-01-21 NOTE — Telephone Encounter (Signed)
Notified by Dr. Valentina Lucks of significantly high potassium on patient's lab from yesterday.  We have called both phone numbers for the patient and were unable to reach him although I did leave a voicemail on his cell phone.  I was able to reach his wife on her cell phone and asked her to have him call us back as it was important.  I do have appointments this afternoon that Jeffrey [name] can have if he calls back in time, if he does call it is important he gets put on the phone with either myself or Dr. Valentina Lucks.  [name]

## 2020-01-21 NOTE — Progress Notes (Signed)
Reviewed: I agree with Dr. Koval's documentation and management. 

## 2020-01-21 NOTE — Assessment & Plan Note (Signed)
Diabetes longstanding and currently under fairly good control however diarrhea attributed to Trulicity is significant. . Patient is able to verbalize appropriate hypoglycemia management plan. Patient appears adherent with medication. Control is suboptimal due to multiple factors including stress, and obesity. -Continued basal insulin Lantus (insulin glargine) at 20 units daily -Continued  rapid insulin Apidra (insulin glulisine) at 4 units prior to two largest meals of the day..  -Discontinued GLP-1 Trulicity and started Ozempic (generic name semaglutide) at 0.25mg  once weekly. Reassess for improved tolerability vs Trulicity at upcoming visit.  Consider dose adjustment in the near future. Sample medication provided. If this is tolerated, he will need new application at [facility]/ [facility] pharmacy assistance.   -Extensively discussed pathophysiology of diabetes, recommended lifestyle interventions, dietary effects on blood sugar control -Counseled on s/sx of and management of hypoglycemia -Next A1C anticipated at next PCP visit.

## 2020-01-22 ENCOUNTER — Encounter: Payer: Self-pay | Admitting: Family Medicine

## 2020-01-22 LAB — HEMOGLOBIN A1C
Est. average glucose Bld gHb Est-mCnc: 194 mg/dL
Hgb A1c MFr Bld: 8.4 % — ABNORMAL HIGH (ref 4.8–5.6)

## 2020-01-22 LAB — VALPROIC ACID LEVEL: Valproic Acid Lvl: 47 ug/mL — ABNORMAL LOW (ref 50–100)

## 2020-01-22 NOTE — Assessment & Plan Note (Signed)
6.5 on draw earlier this week, unexpected and significantly above baseline. Not listed as hemolyzed but not certified as "not hemolyzed"  ECG without significant changes, will draw CMP/keppra level, patient to check with [name] tommorow after results to consider lokelma.  ED precautions discussed

## 2020-01-22 NOTE — Progress Notes (Signed)
    SUBJECTIVE:   CHIEF COMPLAINT / HPI: hyperkalemia   Hepatic cirrhosis Patient with slightly elevated alk phos, hx of cirrhosis, has been drinking again.  Years since last elastography  Type 2 diabetes mellitus with diabetic neuropathy, with long-term current use of insulin ([place]) A1c drawn, will discuss Thursday. No hyper/hypo glycemia reported  High potassium 6.5 on draw earlier this week, unexpected and significantly above baseline. Not listed as hemolyzed but not certified as "not hemolyzed"  Seizure ([place]) No keppra level lately, patient is also overdue for f/u with neurology.  No recent seizures reported.  PERTINENT  PMH / PSH:   OBJECTIVE:   BP 132/68   Pulse (!) 104   Wt 282 lb 6.4 oz (128.1 kg)   SpO2 98%   BMI 40.52 kg/m   Gen: alert, pleasant, no distress CardiacL regular rate rhythm to my exam.  ECG reviewed and shows no new changes concernign for MI  ASSESSMENT/PLAN:   Hepatic cirrhosis Patient with slightly elevated alk phos, hx of cirrhosis, has been drinking again.  Years since last elastography  [name] elastography ordered and patient counseled against alcohol use  Type 2 diabetes mellitus with diabetic neuropathy, with long-term current use of insulin ([place]) A1c drawn, will discuss Thursday.  No hyper/hypo glycemia reported  High potassium 6.5 on draw earlier this week, unexpected and significantly above baseline. Not listed as hemolyzed but not certified as "not hemolyzed"  ECG without significant changes, will draw CMP/keppra level, patient to check with [name] tommorow after results to consider lokelma.  ED precautions discussed  Seizure ([place]) No keppra level lately, patient is also overdue for f/u with neurology.  No recent seizures reported.  Will draw [place]

## 2020-01-22 NOTE — Assessment & Plan Note (Signed)
A1c drawn, will discuss Thursday.  No hyper/hypo glycemia reported

## 2020-01-22 NOTE — Assessment & Plan Note (Signed)
No keppra level lately, patient is also overdue for f/u with neurology.  No recent seizures reported.  Will draw keppra leval

## 2020-01-22 NOTE — Progress Notes (Signed)
Reviewed patient's stat cmp and saw potassium is improving but still high.  Called and left message on phone that I intend to order lokelma 5mg  TID for two days and see patient for f/u on Thursday.  However patient's pharmacy is closed all weekend.  I have called [facility] (open [facility]hr) and confirmed they have lokelma in stock.  If I can reconnect with patient, will send rx there for him.  -[name]

## 2020-01-22 NOTE — Assessment & Plan Note (Signed)
Patient with slightly elevated alk phos, hx of cirrhosis, has been drinking again.  Years since last elastography  [name] elastography ordered and patient counseled against alcohol use

## 2020-01-27 ENCOUNTER — Ambulatory Visit (INDEPENDENT_AMBULATORY_CARE_PROVIDER_SITE_OTHER): Payer: No Typology Code available for payment source | Admitting: Family Medicine

## 2020-01-27 ENCOUNTER — Other Ambulatory Visit: Payer: Self-pay

## 2020-01-27 ENCOUNTER — Encounter: Payer: Self-pay | Admitting: Family Medicine

## 2020-01-27 VITALS — BP 154/96 | HR 102 | Ht 70.0 in | Wt 280.0 lb

## 2020-01-27 DIAGNOSIS — R569 Unspecified convulsions: Secondary | ICD-10-CM | POA: Diagnosis not present

## 2020-01-27 DIAGNOSIS — N1831 Chronic kidney disease, stage 3a: Secondary | ICD-10-CM | POA: Diagnosis not present

## 2020-01-27 DIAGNOSIS — I10 Essential (primary) hypertension: Secondary | ICD-10-CM

## 2020-01-27 DIAGNOSIS — E875 Hyperkalemia: Secondary | ICD-10-CM | POA: Diagnosis not present

## 2020-01-27 NOTE — Patient Instructions (Signed)
It was a pleasure to see you today! Thank you for choosing [facility] for your primary care. Jeffrey [name] was seen for follow-up for hyperkalemia. Come back to the clinic in 2 weeks to continue working on her medication plan.  Today would use more labs, I will call you on your voicemail and let you know if we need to change any of your medication.  Please remember to try and stop using the naproxen.  We did call your neurologist and had to place a referral for you.  If they don't call you within a week or so, please let us know.  We have also placed a referral for a nephrologist.  If you do not hear from a kidney doctor in the next 2 weeks for an appointment, please call us and let us know so that we can try and arrange that.   Please bring all your medications to every doctors visit   Sign up for My Chart to have easy access to your labs results, and communication with your Primary care physician.     Please check-out at the front desk before leaving the clinic.     Best,  Dr. [name] FAMILY MEDICINE RESIDENT - PGY3 01/27/2020 4:08 PM

## 2020-01-27 NOTE — Progress Notes (Signed)
SUBJECTIVE:   CHIEF COMPLAINT / HPI: Follow-up for hyperkalemia  Patient with multiple comorbidities.  History of refractive hypertension on 3 antihypertensives currently, was recently started on spironolactone when he was coincidentally noted to be hyperkalemic.  He is CKD 3 a and had apparently been using Mrs. Dash as a Forensic psychologist.  Was also using valproic acid for history of seizures.  At no point was symptomatic and when called over the weekend to be notified that his potassium was still high but had lowered slightly, he did not pick up the phone he did get a message but decided not to call back because he said that he felt well.  He is here today for clinic follow-up and says he still feels well.  He said is difficult for him given his work schedule (he works 12-hour days) to get into these medical appointments and that they are becoming a source of frustration for him although he does want to be healthy.  He declines any suggestion of hospitalization  PERTINENT  PMH / PSH: CKD 3A, MI, seizures, hyperkalemia  OBJECTIVE:   BP (!) 154/96   Pulse (!) 102   Ht 5\' 10"  (1.778 m)   Wt 280 lb (127 kg)   SpO2 98%   BMI 40.18 kg/m   General: Alert, no distress, pleasant Cardiac: Regular rhythm and rate to my auscultation, no murmur heard on my exam  ASSESSMENT/PLAN:   Hyperphosphatemia Noted to 4.4, patient is being referred to nephrology for refractory hypertension and I expect that there is a potential for phosphate binders  Essential hypertension Patient is currently refractory hypertension on 3 blood pressure medications, amlodipine/Azil Sartan/indapamide.  Amlodipine is low-dose but he had leg swelling on 10 mg.  Given his worsening kidney function unable to increase ARB/HCTZ.  Discussed this with patient he is referring to nephrology, I did mention that weight control and working on removing salt from diet could potentially be very useful to him.  Declines nutrition  consult  Seizure [facility]) Patient does have history of seizures, got valproic acid level due to patient's new hyperkalemia.  Valproic acid level is actually low although patient has not been having seizures so he may not need to increase on this.  He was supposed to follow-up with neurology and has not done so because he was lost to follow-up, I have replaced this referral I told him that he should check in with them.  I do not think valproic acid was contributory to his new hyperkalemia  Hyperkalemia Found coincidentally to be over 6.5 on the day that he was checked for starting spironolactone, follow-up in clinic 2 days later showed it to be a 5.5 and he did not return phone call over the weekend so did not get [facility] or come into the hospital for stabilization.  On 4/24 I noted the results from his 4/22 visit to be increased again to 6.2, I called his phone to try to get him to consider coming to the hospital versus a less preferred option of outpatient Lokelma, but he did not pick up and I had to leave a voicemail asking him to call back and speak to the resident on-call.  I will also write a message to our nursing pool team to try and get an appointment for him on Monday if he has not responded by then.  Stage 3a chronic kidney disease Now also with hyperkalemia, hyperphosphatemia, CKD stage IIIa with refractory hypertension on 3 antihypertensives  Discussed the significance of  these issues with the patient and have placed referral to nephrology     [name], [place]

## 2020-01-28 LAB — HEPATIC FUNCTION PANEL
ALT: 21 IU/L (ref 0–44)
AST: 32 IU/L (ref 0–40)
Albumin: 3.9 g/dL (ref 3.8–4.9)
Alkaline Phosphatase: 168 IU/L — ABNORMAL HIGH (ref 39–117)
Bilirubin Total: 0.2 mg/dL (ref 0.0–1.2)
Bilirubin, Direct: 0.07 mg/dL (ref 0.00–0.40)
Total Protein: 7.2 g/dL (ref 6.0–8.5)

## 2020-01-28 LAB — CBC
Hematocrit: 39.1 % (ref 37.5–51.0)
Hemoglobin: 12.6 g/dL — ABNORMAL LOW (ref 13.0–17.7)
MCH: 28.3 pg (ref 26.6–33.0)
MCHC: 32.2 g/dL (ref 31.5–35.7)
MCV: 88 fL (ref 79–97)
Platelets: 236 10*3/uL (ref 150–450)
RBC: 4.45 x10E6/uL (ref 4.14–5.80)
RDW: 17.5 % — ABNORMAL HIGH (ref 11.6–15.4)
WBC: 5.5 10*3/uL (ref 3.4–10.8)

## 2020-01-28 LAB — BASIC METABOLIC PANEL
BUN/Creatinine Ratio: 8 — ABNORMAL LOW (ref 9–20)
BUN: 12 mg/dL (ref 6–24)
CO2: 22 mmol/L (ref 20–29)
Calcium: 9.2 mg/dL (ref 8.7–10.2)
Chloride: 91 mmol/L — ABNORMAL LOW (ref 96–106)
Creatinine, Ser: 1.58 mg/dL — ABNORMAL HIGH (ref 0.76–1.27)
GFR calc Af Amer: 56 mL/min/{1.73_m2} — ABNORMAL LOW (ref >59)
GFR calc non Af Amer: 49 mL/min/{1.73_m2} — ABNORMAL LOW (ref >59)
Glucose: 329 mg/dL — ABNORMAL HIGH (ref 65–99)
Potassium: 6.2 mmol/L — ABNORMAL HIGH (ref 3.5–5.2)
Sodium: 134 mmol/L (ref 134–144)

## 2020-01-28 LAB — PHOSPHORUS: Phosphorus: 4.4 mg/dL — ABNORMAL HIGH (ref 2.8–4.1)

## 2020-01-29 ENCOUNTER — Telehealth: Payer: Self-pay | Admitting: Family Medicine

## 2020-01-29 DIAGNOSIS — N1831 Chronic kidney disease, stage 3a: Secondary | ICD-10-CM | POA: Insufficient documentation

## 2020-01-29 NOTE — Telephone Encounter (Signed)
Noted lab value of potassium 6.2 from patient's visit to clinic on Thursday.  I did call the patient on his cell phone and left a message very clearly telling him that this was going up and is a dangerous issue.  I told him that I believe that he might need to come into the hospital to try and get this settled down and that outpatient lokelma, was a much less preferable treatment plan given the fact that this is now going back up but does not appear to be resolving on its own.  I told him to call the clinic number so that the on-call resident could discuss the plan with him as soon as he got this message.  I did also call his home number and the phone rang without option to leave voicemail.  [name]

## 2020-01-29 NOTE — Assessment & Plan Note (Signed)
Noted to 4.4, patient is being referred to nephrology for refractory hypertension and I expect that there is a potential for phosphate binders

## 2020-01-29 NOTE — Assessment & Plan Note (Signed)
Found coincidentally to be over 6.5 on the day that he was checked for starting spironolactone, follow-up in clinic 2 days later showed it to be a 5.5 and he did not return phone call over the weekend so did not get [facility] or come into the hospital for stabilization.  On 4/24 I noted the results from his 4/22 visit to be increased again to 6.2, I called his phone to try to get him to consider coming to the hospital versus a less preferred option of outpatient [facility], but he did not pick up and I had to leave a voicemail asking him to call back and speak to the resident on-call.  I will also write a message to our nursing pool team to try and get an appointment for him on Monday if he has not responded by then.

## 2020-01-29 NOTE — Assessment & Plan Note (Signed)
Now also with hyperkalemia, hyperphosphatemia, CKD stage IIIa with refractory hypertension on 3 antihypertensives  Discussed the significance of these issues with the patient and have placed referral to nephrology

## 2020-01-29 NOTE — Assessment & Plan Note (Signed)
Patient does have history of seizures, got valproic acid level due to patient's new hyperkalemia.  Valproic acid level is actually low although patient has not been having seizures so he may not need to increase on this.  He was supposed to follow-up with neurology and has not done so because he was lost to follow-up, I have replaced this referral I told him that he should check in with them.  I do not think valproic acid was contributory to his new hyperkalemia

## 2020-01-29 NOTE — Assessment & Plan Note (Signed)
Patient is currently refractory hypertension on 3 blood pressure medications, amlodipine/Azil Sartan/indapamide.  Amlodipine is low-dose but he had leg swelling on 10 mg.  Given his worsening kidney function unable to increase ARB/HCTZ.  Discussed this with patient he is referring to nephrology, I did mention that weight control and working on removing salt from diet could potentially be very useful to him.  Declines nutrition consult

## 2020-01-31 ENCOUNTER — Other Ambulatory Visit (HOSPITAL_COMMUNITY): Payer: No Typology Code available for payment source

## 2020-01-31 ENCOUNTER — Ambulatory Visit (HOSPITAL_COMMUNITY): Payer: No Typology Code available for payment source

## 2020-01-31 NOTE — Addendum Note (Signed)
Addended by: [name] R on: 01/31/2020 05:00 PM   Modules accepted: Orders

## 2020-02-01 ENCOUNTER — Telehealth: Payer: Self-pay | Admitting: *Deleted

## 2020-02-01 NOTE — Telephone Encounter (Signed)
LVM to call office, will continue to try and contact pt and will also try and reach out to wife's cell phone. Tried home phone and it only rang with no option to LVM.Jeffrey [name], CMA

## 2020-02-03 ENCOUNTER — Telehealth: Payer: Self-pay | Admitting: *Deleted

## 2020-02-03 NOTE — Telephone Encounter (Signed)
-----   Message from [name], DO sent at 01/29/2020 11:09 AM EDT ----- If no one has reached him over the weekend, please call and try and get him at least on a phone appt with a physician, his potassium is very high again

## 2020-02-09 MED FILL — DIVALPROEX SOD ER 500 MG TA: 500 | 30 days supply | Qty: 60 | Fill #5

## 2020-02-09 MED FILL — ATORVASTATIN 80 MG TABLET: 80 | 30 days supply | Qty: 30 | Fill #6

## 2020-02-09 MED FILL — INDAPAMIDE 2.5 MG TABLET: 2.5 | 30 days supply | Qty: 30 | Fill #4

## 2020-02-14 ENCOUNTER — Ambulatory Visit: Payer: No Typology Code available for payment source | Admitting: Family Medicine

## 2020-02-14 NOTE — Telephone Encounter (Signed)
[name], Juluis Rainier... Letter being mailed asking pt to call our office. Looking at previous message encounters, we have called and tried to reach pt by MyChart.  Ottis Stain, CMA

## 2020-02-17 ENCOUNTER — Other Ambulatory Visit: Payer: Self-pay | Admitting: Family Medicine

## 2020-02-17 DIAGNOSIS — Z794 Long term (current) use of insulin: Secondary | ICD-10-CM

## 2020-02-18 ENCOUNTER — Other Ambulatory Visit: Payer: No Typology Code available for payment source

## 2020-02-18 ENCOUNTER — Telehealth: Payer: Self-pay

## 2020-02-18 ENCOUNTER — Other Ambulatory Visit: Payer: Self-pay

## 2020-02-18 DIAGNOSIS — E875 Hyperkalemia: Secondary | ICD-10-CM

## 2020-02-18 NOTE — Telephone Encounter (Signed)
Hyperkalemia and hyperglycemia seen on labs of 2 weeks ago.  (Apparently we were unable to reach earlier.)  Will repeat BMP as first step.  FU Monday for office visit.  Nurse has confirmed correct contact info in case he has critical hyperkalemia of repeat.

## 2020-02-18 NOTE — Telephone Encounter (Signed)
Patient returns phone call to nurse line to follow up on letter he received in the mail. Spoke with Dr. Andria Frames regarding patient. Patient is scheduled for lab visit this afternoon and follow up with PCP on Monday afternoon at 1610.   Routing to Dr. Andria Frames for future lab orders and PCP.    Strict ED precautions given. Advised patient of importance of answering phone call regarding f/u.    Talbot Grumbling, RN

## 2020-02-19 LAB — BASIC METABOLIC PANEL
BUN/Creatinine Ratio: 8 — ABNORMAL LOW (ref 9–20)
BUN: 11 mg/dL (ref 6–24)
CO2: 22 mmol/L (ref 20–29)
Calcium: 8.9 mg/dL (ref 8.7–10.2)
Chloride: 99 mmol/L (ref 96–106)
Creatinine, Ser: 1.44 mg/dL — ABNORMAL HIGH (ref 0.76–1.27)
GFR calc Af Amer: 63 mL/min/{1.73_m2} (ref >59)
GFR calc non Af Amer: 54 mL/min/{1.73_m2} — ABNORMAL LOW (ref >59)
Glucose: 156 mg/dL — ABNORMAL HIGH (ref 65–99)
Potassium: 5.6 mmol/L — ABNORMAL HIGH (ref 3.5–5.2)
Sodium: 137 mmol/L (ref 134–144)

## 2020-02-21 ENCOUNTER — Other Ambulatory Visit: Payer: Self-pay

## 2020-02-21 ENCOUNTER — Encounter: Payer: Self-pay | Admitting: Family Medicine

## 2020-02-21 ENCOUNTER — Ambulatory Visit (INDEPENDENT_AMBULATORY_CARE_PROVIDER_SITE_OTHER): Payer: No Typology Code available for payment source | Admitting: Family Medicine

## 2020-02-21 VITALS — BP 144/90 | HR 75 | Ht 70.0 in | Wt 277.0 lb

## 2020-02-21 DIAGNOSIS — R569 Unspecified convulsions: Secondary | ICD-10-CM | POA: Diagnosis not present

## 2020-02-21 DIAGNOSIS — E875 Hyperkalemia: Secondary | ICD-10-CM | POA: Diagnosis not present

## 2020-02-21 DIAGNOSIS — Z1211 Encounter for screening for malignant neoplasm of colon: Secondary | ICD-10-CM | POA: Diagnosis not present

## 2020-02-21 DIAGNOSIS — I10 Essential (primary) hypertension: Secondary | ICD-10-CM

## 2020-02-21 NOTE — Progress Notes (Signed)
    SUBJECTIVE:   CHIEF COMPLAINT / HPI: Hyperkalemia  Colon cancer screening Patient has ordered FIT testing in the past, would like to get full colonoscopy at this point, no symptoms.  Seizure ([place]) Known history of seizures, no repeats at this time.  Was supposed to be following up with them and was lost to follow-up.  He now says his insurance will not allow him to see them so he will check with his insurance to see what neurologist they will cover and will send referral there  Hyperkalemia Patient has now been off of his ARB for almost a week,  He is being referred to nephrology for refractory hypertension, will need to check with his insurance to see which nephrologist they cover because he says he cannot see our [facility] affiliated nephrologist.  Here for repeat BMP, no symptoms   PERTINENT  PMH / PSH:   OBJECTIVE:   BP (!) 144/90   Pulse 75   Ht 5\' 10"  (1.778 m)   Wt 277 lb (125.6 kg)   SpO2 98%   BMI 39.75 kg/m   General:, Alert pleasant no distress Respiratory: Clear to auscultation, no cough, no wheeze, no increased work of breathing Cardiac: Regular rate and rhythm, no murmur heard today MSK: No gait disturbance  ASSESSMENT/PLAN:   Colon cancer screening Patient has ordered FIT testing in the past, would like to get full colonoscopy at this point, no symptoms.   will check with his insurance to see which gastroenterologist will be covered and will place the referral  Seizure [facility]) Known history of seizures, no repeats at this time.  Was supposed to be following up with them and was lost to follow-up.  He now says his insurance will not allow him to see them so he will check with his insurance to see what neurologist they will cover and will send referral there  Hyperkalemia Patient has now been off of his ARB for almost a week, repeat BMP shows that his potassium is finally back in her normal limits.  He is being referred to nephrology for refractory hypertension,  will need to check with his insurance to see which nephrologist they cover because he says he cannot see our [facility] affiliated nephrologist  Essential hypertension Still moderately uncontrolled hypertension with systolics over 778, he has had multiple issues with hyperkalemia lately as well as allergic reactions to some of his medications and leg swelling to amlodipine.  Given this we are referring to nephrology for refractory hypertension, patient will need to check with his insurance to see which nephrologist he will be covered for because he says he cannot see our pro nephrologist     [name], [place]

## 2020-02-21 NOTE — Patient Instructions (Signed)
Today we talked about drawing another lab to check your high potassium.  It is important that if you start having any severe symptoms of chest pain or trouble breathing that you come to the hospital immediately.  I will call me the message on your phone with the results of your test, please leave me a message if I need to response so that I know that you got this.  Please check with your insurance and asked them which doctors you are allowed to see in the area within the following specialties: Neurology (follow-up for your seizures) Nephrology(kidney doctor to follow-up on your hypertension and high potassium) Gastroenterology(someone to do a colonoscopy for you)  Call back and leave a message for [name] whenever you know who I can send you all of these issues and I will put the referral in [name]

## 2020-02-22 DIAGNOSIS — Z1211 Encounter for screening for malignant neoplasm of colon: Secondary | ICD-10-CM | POA: Insufficient documentation

## 2020-02-22 LAB — BASIC METABOLIC PANEL
BUN/Creatinine Ratio: 8 — ABNORMAL LOW (ref 9–20)
BUN: 11 mg/dL (ref 6–24)
CO2: 25 mmol/L (ref 20–29)
Calcium: 9 mg/dL (ref 8.7–10.2)
Chloride: 100 mmol/L (ref 96–106)
Creatinine, Ser: 1.41 mg/dL — ABNORMAL HIGH (ref 0.76–1.27)
GFR calc Af Amer: 64 mL/min/{1.73_m2} (ref >59)
GFR calc non Af Amer: 56 mL/min/{1.73_m2} — ABNORMAL LOW (ref >59)
Glucose: 67 mg/dL (ref 65–99)
Potassium: 5.1 mmol/L (ref 3.5–5.2)
Sodium: 141 mmol/L (ref 134–144)

## 2020-02-22 NOTE — Assessment & Plan Note (Signed)
Still moderately uncontrolled hypertension with systolics over 615, he has had multiple issues with hyperkalemia lately as well as allergic reactions to some of his medications and leg swelling to amlodipine.  Given this we are referring to nephrology for refractory hypertension, patient will need to check with his insurance to see which nephrologist he will be covered for because he says he cannot see our pro nephrologist

## 2020-02-22 NOTE — Assessment & Plan Note (Signed)
Patient has now been off of his ARB for almost a week, repeat BMP shows that his potassium is finally back in her normal limits.  He is being referred to nephrology for refractory hypertension, will need to check with his insurance to see which nephrologist they cover because he says he cannot see our [facility] affiliated nephrologist

## 2020-02-22 NOTE — Assessment & Plan Note (Signed)
Known history of seizures, no repeats at this time.  Was supposed to be following up with them and was lost to follow-up.  He now says his insurance will not allow him to see them so he will check with his insurance to see what neurologist they will cover and will send referral there

## 2020-02-22 NOTE — Assessment & Plan Note (Signed)
Patient has ordered FIT testing in the past, would like to get full colonoscopy at this point, no symptoms.   will check with his insurance to see which gastroenterologist will be covered and will place the referral

## 2020-02-29 NOTE — Progress Notes (Signed)
error 

## 2020-03-02 ENCOUNTER — Other Ambulatory Visit: Payer: Self-pay | Admitting: Family Medicine

## 2020-03-02 DIAGNOSIS — Z794 Long term (current) use of insulin: Secondary | ICD-10-CM

## 2020-03-02 DIAGNOSIS — E119 Type 2 diabetes mellitus without complications: Secondary | ICD-10-CM

## 2020-03-02 DIAGNOSIS — E1142 Type 2 diabetes mellitus with diabetic polyneuropathy: Secondary | ICD-10-CM

## 2020-03-10 ENCOUNTER — Telehealth: Payer: Self-pay | Admitting: Pharmacist

## 2020-03-10 MED FILL — TRUE METRIX TEST STRIP: 25 days supply | Qty: 100 | Fill #3

## 2020-03-10 MED FILL — DIVALPROEX SOD ER 500 MG TA: 500 | 30 days supply | Qty: 60 | Fill #6

## 2020-03-10 NOTE — Telephone Encounter (Signed)
Patient called and reported that he has run out of Edarbi last night.  When requesting a refill, he determined that the health department no longer had a valid prescription ([name] has cancelled it per the patient).   He also requested additional sample of Ozempic.     Plan for patient to come to office 6/4 to pick up sample of Ozempic, have BP check.   At visit:  BP:  108/78  HR: 100     O2 sat 96% Weight: 275 lb   Medication Samples have been provided to the patient.  Drug name: Ozempic             Qty: 2 pens  LOT: [id number]  Exp.Date: 08/06/2022  Dosing instructions: 0.5mg  once weekly  The patient has been instructed regarding the correct time, dose, and frequency of taking this medication, including desired effects and most common side effects.   Janeann Forehand 3:41 PM 03/10/2020

## 2020-03-12 NOTE — Telephone Encounter (Signed)
ARB was stopped when potassium was elevated.   BP check in office on Friday was low/at goal.  Patient had minimal edema.   No need to additional therapy at this time.   Ozempic is via sample currently until we determine tolerability and efficacy.

## 2020-03-12 NOTE — Telephone Encounter (Signed)
I am covering Jeffrey [name]'s box this week.  The last note I see regarding his ARB treatemnt was from September 2020 where your note says he is switching to losartan.  I didn't see any losartan prescribed since that time.  Do I need to send in a prescription for losartan or Edarbi? Neither are currently on his medication list.   Do I also need to send in refills for ozempic or does he get samples of those?   Linna Hoff

## 2020-03-13 NOTE — Telephone Encounter (Signed)
Noted and agree. 

## 2020-03-14 MED FILL — ?AMLODIPINE BESYLATE 5MG TA: 5 | 30 days supply | Qty: 30 | Fill #2

## 2020-03-15 ENCOUNTER — Other Ambulatory Visit: Payer: Self-pay | Admitting: Family Medicine

## 2020-03-15 DIAGNOSIS — E1140 Type 2 diabetes mellitus with diabetic neuropathy, unspecified: Secondary | ICD-10-CM

## 2020-03-16 ENCOUNTER — Other Ambulatory Visit: Payer: Self-pay | Admitting: Neurology

## 2020-03-16 ENCOUNTER — Ambulatory Visit (INDEPENDENT_AMBULATORY_CARE_PROVIDER_SITE_OTHER): Payer: 59 | Admitting: Neurology

## 2020-03-16 ENCOUNTER — Encounter: Payer: Self-pay | Admitting: Neurology

## 2020-03-16 VITALS — BP 153/91 | HR 103 | Ht 70.0 in | Wt 268.0 lb

## 2020-03-16 DIAGNOSIS — G40909 Epilepsy, unspecified, not intractable, without status epilepticus: Secondary | ICD-10-CM | POA: Insufficient documentation

## 2020-03-16 DIAGNOSIS — E1142 Type 2 diabetes mellitus with diabetic polyneuropathy: Secondary | ICD-10-CM | POA: Diagnosis not present

## 2020-03-16 DIAGNOSIS — R569 Unspecified convulsions: Secondary | ICD-10-CM

## 2020-03-16 DIAGNOSIS — Z794 Long term (current) use of insulin: Secondary | ICD-10-CM | POA: Insufficient documentation

## 2020-03-16 DIAGNOSIS — E119 Type 2 diabetes mellitus without complications: Secondary | ICD-10-CM | POA: Insufficient documentation

## 2020-03-16 DIAGNOSIS — R251 Tremor, unspecified: Secondary | ICD-10-CM | POA: Diagnosis not present

## 2020-03-16 DIAGNOSIS — E1165 Type 2 diabetes mellitus with hyperglycemia: Secondary | ICD-10-CM | POA: Insufficient documentation

## 2020-03-16 MED ORDER — DICLOFENAC SODIUM 1 % EX CREA: 4.0000 g | TOPICAL_CREAM | Freq: Three times a day (TID) | CUTANEOUS | 11 refills | Status: DC | PRN

## 2020-03-16 MED ORDER — DIVALPROEX SODIUM ER 500 MG PO TB24: 1000.0000 mg | ORAL_TABLET | Freq: Every evening | ORAL | 4 refills | Status: DC

## 2020-03-16 MED ORDER — LIDOCAINE-PRILOCAINE 2.5-2.5 % EX CREA
TOPICAL_CREAM | CUTANEOUS | 11 refills | Status: DC
Start: 2020-03-16 — End: 2020-07-25

## 2020-03-16 MED ORDER — LAMOTRIGINE 100 MG PO TABS: 100.0000 mg | ORAL_TABLET | Freq: Two times a day (BID) | ORAL | 4 refills | Status: DC

## 2020-03-16 MED ORDER — LAMOTRIGINE 25 MG PO TABS
ORAL_TABLET | ORAL | 0 refills | Status: DC
Start: 2020-03-16 — End: 2020-06-06

## 2020-03-16 NOTE — Progress Notes (Signed)
PATIENT: Jeffrey [name] DOB: 1965-11-06  Chief Complaint  Patient presents with  . History of Seizure Disorder    He is here to re-establish neurology care. His last seizure was in August 2020. He is currently taking Depakote ER 500mg  BID.   Jeffrey [name] PCP    [name], DO (Per pt, normal PCP is [name] - referral placed by another MD in same office)     HISTORICAL  Jeffrey [name] is a 54 year old male, seen in request by his primary care physician Dr. [name] for evaluation of seizure,  I saw him initially in 2017, he suffered cardiac arrest in March 2014, PEA cardiac arrest at home, require hypothermia protocol, pressor, sedation, prolonged intubation, he develop ventilatory dependent respiratory failure secondary to respiratory arrest which led to cardiac arrest, respiratory arrest likely due to obstructive sleep apnea and not complying with the CPAP at home, additionally, he was found to have pulmonary infiltration, require antibiotic treatment, history of GI bleeding with anemia, EGD confirmed ulcer. Type 2 diabetes, insulin-dependent, poorly controlled, A1c was 9.5.  He had later hospital admission for alcohol detoxification, recurrent hypoglycemia episode, glucose was in 30s.  When I saw him in 2017, he was on disability, complains of mental slowing, memory loss, and also frequent passing out spells since May 2016, 2-3 times each week, usually started by feeling warm in his feet, ascending sensation throughout his body, transient loss of consciousness, this is different from his typical hypoglycemia episode, where he usually feel hungry, nervousness, he checks his glucose 3-4 times each day  Laboratory evaluation September 2017, glucose was 445, sodium 134, creatinine 1.0, A1c August was 11 point 8, fasting lipid profile, triglycerides 290, LDL 90, CBC showed hemoglobin of 13.1,  His passing out episode can happen in sit down, or standing position, no chest pain, no heart  palpitation, no tongue biting, no bowel and bladder incontinence,  Prolonged cardiac monitoring July 2017 there was no significant cardiac arrhythmia noticed during the reported episode.  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, reported 1 seizure like spell in August 2020, he was at home, felt a rising sensation, body tremor, then transient loss of consciousness  In addition, today she complains of bilateral hands tremor since his cardiac arrest in 2014, gradually getting worse, involving both hands, complains of difficulty signing paper,  He also has type 2 diabetes, diabetic peripheral neuropathy, complains of bilateral feet paresthesia, needle prick, taking gabapentin 900 mg 4 times a day, which has been helpful, even that he often complains of bilateral hands itching sensation  REVIEW OF SYSTEMS: Full 14 system review of systems performed and notable only for as above All other review of systems were negative.  ALLERGIES: Allergies  Allergen Reactions  . Lisinopril Other (See Comments)    Pt reports nose bleed.    HOME MEDICATIONS: Current Outpatient Medications  Medication Sig Dispense Refill  . amLODipine (NORVASC) 5 MG tablet Take 1 tablet (5 mg total) by mouth daily. 90 tablet 3  .  APIDRA SOLOSTAR 100 UNIT/ML Solostar Pen INJECT 2-5 UNITS INTO THE SKIN 3 TIMES DAILY DEPENDING ON MEAL SIZE AS DISCUSSED. 6 mL 0  . aspirin EC 81 MG tablet Take 1 tablet (81 mg total) by mouth at bedtime. 90 tablet 3  .  atorvastatin (LIPITOR) 80 MG tablet Take 1 tablet (80 mg total) by mouth at bedtime. 90 tablet 3  . Dexlansoprazole 30 MG capsule Take 1 capsule (30 mg total) by mouth daily. 90 capsule 3  . divalproex (DEPAKOTE ER) 500 MG 24 hr tablet Take 1 tablet (500 mg total) by mouth 2 (two) times daily. 580 tablet 3  . folic acid (FOLVITE) 1 MG tablet Take 1 tablet (1 mg total) by mouth daily. 90 tablet 3  . gabapentin (NEURONTIN) 300 MG capsule Take 3 capsules (900 mg total) by mouth 4 (four) times daily. 360 capsule 3  . indapamide (LOZOL) 2.5 MG tablet Take 1 tablet (2.5 mg total) by mouth daily. 90 tablet 3  . LANTUS SOLOSTAR 100 UNIT/ML Solostar Pen INJECT 20 UNITS INTO THE SKIN DAILY. 6 mL 0  . metFORMIN (GLUCOPHAGE-XR) 500 MG 24 hr tablet Take 2 tablets (1,000 mg total) by mouth 2 (two) times daily. 180 tablet 3  . mirtazapine (REMERON) 30 MG tablet Take 1 tablet (30 mg total) by mouth at bedtime. 90 tablet 3  . Multiple Vitamin (MULTIVITAMIN WITH MINERALS) TABS tablet Take 1 tablet by mouth at bedtime.     . Semaglutide,0.25 or 0.5MG /DOS, (OZEMPIC, 0.25 OR 0.5 MG/DOSE,) 2 MG/1.5ML SOPN Inject 0.25 mg into the skin once a week. 2 pen 0  . sildenafil (VIAGRA) 100 MG tablet Take 100 mg by mouth daily as needed for erectile dysfunction.    . thiamine (VITAMIN B-1) 100 MG tablet Take 1 tablet (100 mg total) by mouth daily. (Patient taking differently: Take 200 mg by mouth daily. ) 30 tablet 12   No current facility-administered medications for this visit.    PAST MEDICAL HISTORY: Past Medical History:  Diagnosis Date  . Acute renal insufficiency 12/08/2012  . AKI (acute kidney injury) ([place])   . ALCOHOL ABUSE, HX OF 11/10/2007  . Anoxic encephalopathy ([place])   . Anxiety   . Bleeding ulcer 2014  . Blood transfusion 2014   "related to bleeding ulcer"  . Cardiac arrest ([place]) 12/07/2012   Anoxic encephalopathy  . GERD (gastroesophageal reflux disease)   . High cholesterol   . Hypertension   . OSA on  CPAP 12/07/2012  . Seizure-like activity ([place])   . Type II diabetes mellitus ([place])   . Venous insufficiency 12/13/2011    PAST SURGICAL HISTORY: Past Surgical History:  Procedure Laterality Date  . CARDIOVASCULAR STRESS TEST  03/16/13   Very Poor Exercise Tolerance; NON DIAGNOSTIC TEST  . CATARACT EXTRACTION W/ INTRAOCULAR LENS IMPLANT Left 03/07/2014   [place] @ [facility]  . ESOPHAGOGASTRODUODENOSCOPY Left 11/26/2012   Procedure: ESOPHAGOGASTRODUODENOSCOPY (EGD);  Surgeon: Wonda Horner, MD;  Location: [facility] ENDOSCOPY;  Service: Endoscopy;  Laterality: Left;  . ESOPHAGOGASTRODUODENOSCOPY N/A 10/10/2014   Procedure: ESOPHAGOGASTRODUODENOSCOPY (EGD);  Surgeon: Ladene Artist, MD;  Location[facility] ENDOSCOPY;  Service: Endoscopy;  Laterality: N/A;  . KNEE ARTHROSCOPY Left ~ 1999    FAMILY HISTORY: Family History  Problem Relation Age of Onset  . Other Mother        Unsure of medical history.  . Diabetes Mellitus II Father     SOCIAL HISTORY: Social History   Socioeconomic History  . Marital  status: Married    Spouse name: Not on file  . Number of children: 0  . Years of education: Bachelors  . Highest education level: Not on file  Occupational History  . Occupation: maintenance tech  Tobacco Use  . Smoking status: Never Smoker  . Smokeless tobacco: Current User    Types: Snuff  Substance and Sexual Activity  . Alcohol use: Yes    Alcohol/week: 6.0 standard drinks    Types: 6 Cans of beer per week  . Drug use: No  . Sexual activity: Yes  Other Topics Concern  . Not on file  Social History Narrative   Lives at home with wife. He has four stepchildren.    Right-handed.   No caffeine use.   Social Determinants of Health   Financial Resource Strain:   . Difficulty of Paying Living Expenses:   Food Insecurity:   . Worried About Charity fundraiser in the Last Year:   . Arboriculturist in the Last Year:   Transportation Needs:   . Film/video editor (Medical):     Jeffrey [name] Lack of Transportation (Non-Medical):   Physical Activity:   . Days of Exercise per Week:   . Minutes of Exercise per Session:   Stress:   . Feeling of Stress :   Social Connections:   . Frequency of Communication with Friends and Family:   . Frequency of Social Gatherings with Friends and Family:   . Attends Religious Services:   . Active Member of Clubs or Organizations:   . Attends Archivist Meetings:   Jeffrey [name] Marital Status:   Intimate Partner Violence:   . Fear of Current or Ex-Partner:   . Emotionally Abused:   Jeffrey [name] Physically Abused:   . Sexually Abused:      PHYSICAL EXAM   Vitals:   03/16/20 1607  BP: (!) 153/91  Pulse: (!) 103  Weight: 268 lb (121.6 kg)  Height: 5\' 10"  (1.778 m)   Not recorded     Body mass index is 38.45 kg/m.  PHYSICAL EXAMNIATION:  Gen: NAD, conversant, well nourised, well groomed                     Cardiovascular: Regular rate rhythm, no peripheral edema, warm, nontender. Eyes: Conjunctivae clear without exudates or hemorrhage Neck: Supple, no carotid bruits. Pulmonary: Clear to auscultation bilaterally   NEUROLOGICAL EXAM:  MENTAL STATUS: Speech:    Speech is normal; fluent and spontaneous with normal comprehension.  Cognition:     Orientation to time, place and person     Normal recent and remote memory     Normal Attention span and concentration     Normal Language, naming, repeating,spontaneous speech     Fund of knowledge   CRANIAL NERVES: CN II: Visual fields are full to confrontation. Pupils are round equal and briskly reactive to light. CN III, IV, VI: extraocular movement are normal. No ptosis. CN V: Facial sensation is intact to light touch CN VII: Face is symmetric with normal eye closure  CN VIII: Hearing is normal to causal conversation. CN IX, X: Phonation is normal. CN XI: Head turning and shoulder shrug are intact  MOTOR: Bilateral hand posturing tremor, no weakness, no  rigidity,  REFLEXES: Reflexes are 2+ and symmetric at the biceps, triceps, knees, and absent at ankles. Plantar responses are flexor.  SENSORY: Length dependent decreased light touch, pinprick, vibratory sensation COORDINATION: There is no trunk or limb dysmetria noted.  GAIT/STANCE: Need push-up to get up from seated position, mildly antalgic  DIAGNOSTIC DATA (LABS, IMAGING, TESTING) - I reviewed patient records, labs, notes, testing and imaging myself where available.   ASSESSMENT AND PLAN  LYDON VANSICKLE is a 54 y.o. 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 Ph.D.  [facility] [address] [place], [place] [address] Ph: [phone] Fax: [phone]  CC: Referring Provider

## 2020-03-20 ENCOUNTER — Other Ambulatory Visit: Payer: 59

## 2020-03-23 ENCOUNTER — Telehealth: Payer: Self-pay | Admitting: Pharmacist

## 2020-03-23 MED FILL — INDAPAMIDE 2.5 MG TABLET: 2.5 | 30 days supply | Qty: 30 | Fill #5

## 2020-03-23 NOTE — Telephone Encounter (Signed)
It appears patient recently got new insurance in May 2021. Called insurance representative to run a test claim for Jardiance.  Representative ran test claim for Jardiance 10 mg daily (#30 for 30 day supply) and patient's copay would be $25/month. Wilder Glade is not covered on his insurance. Will keep information in mind for further diabetes management.

## 2020-03-24 ENCOUNTER — Ambulatory Visit (INDEPENDENT_AMBULATORY_CARE_PROVIDER_SITE_OTHER): Payer: 59 | Admitting: Family Medicine

## 2020-03-24 ENCOUNTER — Encounter: Payer: Self-pay | Admitting: Family Medicine

## 2020-03-24 ENCOUNTER — Other Ambulatory Visit: Payer: Self-pay

## 2020-03-24 VITALS — BP 138/80 | HR 112 | Ht 70.0 in | Wt 272.8 lb

## 2020-03-24 DIAGNOSIS — Z794 Long term (current) use of insulin: Secondary | ICD-10-CM | POA: Diagnosis not present

## 2020-03-24 DIAGNOSIS — I10 Essential (primary) hypertension: Secondary | ICD-10-CM | POA: Diagnosis not present

## 2020-03-24 DIAGNOSIS — R000 Tachycardia, unspecified: Secondary | ICD-10-CM | POA: Diagnosis not present

## 2020-03-24 DIAGNOSIS — E1140 Type 2 diabetes mellitus with diabetic neuropathy, unspecified: Secondary | ICD-10-CM | POA: Diagnosis not present

## 2020-03-24 MED ORDER — DEXCOM G6 RECEIVER DEVI: 1.0000 | Freq: Once | 1 refills | Status: AC

## 2020-03-24 MED ORDER — METOPROLOL TARTRATE 25 MG PO TABS: 25.0000 mg | ORAL_TABLET | Freq: Two times a day (BID) | ORAL | 0 refills | Status: DC

## 2020-03-24 MED ORDER — DEXCOM G6 SENSOR MISC: 1.0000 | Freq: Once | 1 refills | Status: DC

## 2020-03-24 MED ORDER — DEXCOM G6 TRANSMITTER MISC: 1.0000 | Freq: Once | 1 refills | Status: AC

## 2020-03-24 NOTE — Patient Instructions (Signed)
Today we talked about starting the low-dose metoprolol for your blood pressure and heart rate.  He said he can get a blood pressure monitor if you start noticing that your blood pressure first number is dropping below 100 or the second numbers dropping below 70 please call us immediately.  This should also start lower your heart rate so your pulses per minute should hopefully go below 100, if you start noticing its drifting below 70 or you feel strange please let us know immediately so that you can stop taking this medicine  At your request I have ordered a Dexcom glucose monitor, this way you can find out if your pharmacy will cover it, I think it is unlikely so if they say no then please let us know and we can start working on a prior authorization.  [name]

## 2020-03-24 NOTE — Progress Notes (Signed)
    SUBJECTIVE:   CHIEF COMPLAINT / HPI: Hypertension follow-up  Patient can only tolerate low-dose amlodipine due to leg swelling, also unable to take ARB or spironolactone due to hyperkalemia.  We can start metoprolol because he also has concurrent tachycardia but we have referred him to nephrology for refractory hypertension at this point.[name]  PERTINENT  PMH / PSH:    OBJECTIVE:   BP 138/80   Pulse (!) 112   Ht 5\' 10"  (1.778 m)   Wt 272 lb 12.8 oz (123.7 kg)   SpO2 97%   BMI 39.14 kg/m   General: Alert and pleasant, no distress Cardiac: Regular rhythm but tachycardic to low 100s, no murmur noted Lungs: There to auscultation bilaterally, no increased work of breathing, no cough/wheeze  ASSESSMENT/PLAN:   Essential hypertension Patient can tolerate amlodipine 5, had leg swelling with amlodipine 10.  Taken off of ARB and spironolactone for concern over hyperkalemia.  Given that he also has low tachycardia, will start metoprolol 25 mg twice daily and he can come back in a week for a vital sign check.  Tachycardia Regular rhythm, mild tachycardia  We are starting with Toprol 25 twice daily to cover for this and hypertension  Type 2 diabetes mellitus with diabetic neuropathy, with long-term current use of insulin [facility]) Patient wants continuous glucose monitor and is confident that his insurance will pay for it.  I told him that I do not believe this will be the case but that I will gladly put the order in and we can attempt to try and see if we can find him at least a discount or partial pay.     [name], [place]

## 2020-03-27 NOTE — Assessment & Plan Note (Signed)
Patient wants continuous glucose monitor and is confident that his insurance will pay for it.  I told him that I do not believe this will be the case but that I will gladly put the order in and we can attempt to try and see if we can find him at least a discount or partial pay.

## 2020-03-27 NOTE — Assessment & Plan Note (Signed)
Patient can tolerate amlodipine 5, had leg swelling with amlodipine 10.  Taken off of ARB and spironolactone for concern over hyperkalemia.  Given that he also has low tachycardia, will start metoprolol 25 mg twice daily and he can come back in a week for a vital sign check.

## 2020-03-27 NOTE — Assessment & Plan Note (Signed)
Regular rhythm, mild tachycardia  We are starting with Toprol 25 twice daily to cover for this and hypertension

## 2020-03-28 ENCOUNTER — Other Ambulatory Visit: Payer: Self-pay

## 2020-03-28 MED FILL — GABAPENTIN 300 MG CAPSULE: 300 | 30 days supply | Qty: 360 | Fill #1

## 2020-03-29 ENCOUNTER — Encounter: Payer: Self-pay | Admitting: Neurology

## 2020-03-29 ENCOUNTER — Other Ambulatory Visit: Payer: Self-pay

## 2020-03-29 MED ORDER — SILDENAFIL CITRATE 100 MG PO TABS: 100.0000 mg | ORAL_TABLET | Freq: Every day | ORAL | 0 refills | Status: DC | PRN

## 2020-03-30 MED FILL — ?MIRTAZAPINE 30 MG TABLET: 30 | 30 days supply | Qty: 30 | Fill #7

## 2020-04-03 ENCOUNTER — Encounter (HOSPITAL_COMMUNITY): Payer: Self-pay

## 2020-04-03 ENCOUNTER — Other Ambulatory Visit: Payer: Self-pay

## 2020-04-03 ENCOUNTER — Emergency Department (HOSPITAL_COMMUNITY): Payer: 59

## 2020-04-03 ENCOUNTER — Emergency Department (HOSPITAL_COMMUNITY)
Admission: EM | Admit: 2020-04-03 | Discharge: 2020-04-03 | Disposition: A | Discharge status: Home / Self Care | Payer: 59 | Attending: Emergency Medicine | Admitting: Emergency Medicine

## 2020-04-03 DIAGNOSIS — N183 Chronic kidney disease, stage 3 unspecified: Secondary | ICD-10-CM | POA: Insufficient documentation

## 2020-04-03 DIAGNOSIS — R569 Unspecified convulsions: Secondary | ICD-10-CM | POA: Insufficient documentation

## 2020-04-03 DIAGNOSIS — I129 Hypertensive chronic kidney disease with stage 1 through stage 4 chronic kidney disease, or unspecified chronic kidney disease: Secondary | ICD-10-CM | POA: Insufficient documentation

## 2020-04-03 DIAGNOSIS — Z79899 Other long term (current) drug therapy: Secondary | ICD-10-CM | POA: Insufficient documentation

## 2020-04-03 DIAGNOSIS — R780 Finding of alcohol in blood: Secondary | ICD-10-CM

## 2020-04-03 DIAGNOSIS — F1010 Alcohol abuse, uncomplicated: Secondary | ICD-10-CM | POA: Diagnosis not present

## 2020-04-03 DIAGNOSIS — E1122 Type 2 diabetes mellitus with diabetic chronic kidney disease: Secondary | ICD-10-CM | POA: Insufficient documentation

## 2020-04-03 DIAGNOSIS — Z20822 Contact with and (suspected) exposure to covid-19: Secondary | ICD-10-CM | POA: Diagnosis not present

## 2020-04-03 DIAGNOSIS — Z794 Long term (current) use of insulin: Secondary | ICD-10-CM | POA: Insufficient documentation

## 2020-04-03 DIAGNOSIS — R42 Dizziness and giddiness: Secondary | ICD-10-CM | POA: Diagnosis not present

## 2020-04-03 DIAGNOSIS — Y906 Blood alcohol level of 120-199 mg/100 ml: Secondary | ICD-10-CM | POA: Insufficient documentation

## 2020-04-03 DIAGNOSIS — R55 Syncope and collapse: Secondary | ICD-10-CM | POA: Diagnosis present

## 2020-04-03 LAB — BASIC METABOLIC PANEL
Anion gap: 15 (ref 5–15)
BUN: 7 mg/dL (ref 6–20)
CO2: 25 mmol/L (ref 22–32)
Calcium: 8.7 mg/dL — ABNORMAL LOW (ref 8.9–10.3)
Chloride: 94 mmol/L — ABNORMAL LOW (ref 98–111)
Creatinine, Ser: 1.37 mg/dL — ABNORMAL HIGH (ref 0.61–1.24)
GFR calc Af Amer: >60 mL/min (ref >60)
GFR calc non Af Amer: 58 mL/min — ABNORMAL LOW (ref >60)
Glucose, Bld: 129 mg/dL — ABNORMAL HIGH (ref 70–99)
Potassium: 3.4 mmol/L — ABNORMAL LOW (ref 3.5–5.1)
Sodium: 134 mmol/L — ABNORMAL LOW (ref 135–145)

## 2020-04-03 LAB — URINALYSIS, ROUTINE W REFLEX MICROSCOPIC
Bacteria, UA: NONE SEEN
Bilirubin Urine: NEGATIVE
Glucose, UA: >=500 mg/dL — AB
Ketones, ur: NEGATIVE mg/dL
Leukocytes,Ua: NEGATIVE
Nitrite: NEGATIVE
Protein, ur: 100 mg/dL — AB
Specific Gravity, Urine: 1.002 — ABNORMAL LOW (ref 1.005–1.030)
pH: 5 (ref 5.0–8.0)

## 2020-04-03 LAB — CBC WITH DIFFERENTIAL/PLATELET
Abs Immature Granulocytes: 0.03 10*3/uL (ref 0.00–0.07)
Basophils Absolute: 0.1 10*3/uL (ref 0.0–0.1)
Basophils Relative: 1 %
Eosinophils Absolute: 0.5 10*3/uL (ref 0.0–0.5)
Eosinophils Relative: 6 %
HCT: 40.2 % (ref 39.0–52.0)
Hemoglobin: 12.5 g/dL — ABNORMAL LOW (ref 13.0–17.0)
Immature Granulocytes: 0 %
Lymphocytes Relative: 19 %
Lymphs Abs: 1.8 10*3/uL (ref 0.7–4.0)
MCH: 28.2 pg (ref 26.0–34.0)
MCHC: 31.1 g/dL (ref 30.0–36.0)
MCV: 90.5 fL (ref 80.0–100.0)
Monocytes Absolute: 1 10*3/uL (ref 0.1–1.0)
Monocytes Relative: 11 %
Neutro Abs: 5.8 10*3/uL (ref 1.7–7.7)
Neutrophils Relative %: 63 %
Platelets: 256 10*3/uL (ref 150–400)
RBC: 4.44 MIL/uL (ref 4.22–5.81)
RDW: 16.7 % — ABNORMAL HIGH (ref 11.5–15.5)
WBC: 9.4 10*3/uL (ref 4.0–10.5)
nRBC: 0 % (ref 0.0–0.2)

## 2020-04-03 LAB — ETHANOL: Alcohol, Ethyl (B): 195 mg/dL — ABNORMAL HIGH (ref <10)

## 2020-04-03 LAB — HEPATIC FUNCTION PANEL
ALT: 18 U/L (ref 0–44)
AST: 29 U/L (ref 15–41)
Albumin: 2.9 g/dL — ABNORMAL LOW (ref 3.5–5.0)
Alkaline Phosphatase: 116 U/L (ref 38–126)
Bilirubin, Direct: <0.1 mg/dL (ref 0.0–0.2)
Total Bilirubin: 0.5 mg/dL (ref 0.3–1.2)
Total Protein: 6.9 g/dL (ref 6.5–8.1)

## 2020-04-03 LAB — RAPID URINE DRUG SCREEN, HOSP PERFORMED
Amphetamines: NOT DETECTED
Barbiturates: NOT DETECTED
Benzodiazepines: NOT DETECTED
Cocaine: NOT DETECTED
Opiates: NOT DETECTED
Tetrahydrocannabinol: NOT DETECTED

## 2020-04-03 LAB — SARS CORONAVIRUS 2 BY RT PCR (HOSPITAL ORDER, PERFORMED IN ~~LOC~~ HOSPITAL LAB): SARS Coronavirus 2: NEGATIVE

## 2020-04-03 LAB — TROPONIN I (HIGH SENSITIVITY)
Troponin I (High Sensitivity): 11 ng/L (ref <18)
Troponin I (High Sensitivity): 10 ng/L (ref <18)

## 2020-04-03 LAB — AMMONIA: Ammonia: 35 umol/L (ref 9–35)

## 2020-04-03 LAB — VALPROIC ACID LEVEL: Valproic Acid Lvl: 32 ug/mL — ABNORMAL LOW (ref 50.0–100.0)

## 2020-04-03 LAB — CBG MONITORING, ED: Glucose-Capillary: 170 mg/dL — ABNORMAL HIGH (ref 70–99)

## 2020-04-03 MED ORDER — SODIUM CHLORIDE 0.9 % IV BOLUS
1000.0000 mL | Freq: Once | INTRAVENOUS | Status: AC
  Administered 2020-04-03: 1000 mL via INTRAVENOUS

## 2020-04-03 MED ORDER — MECLIZINE HCL 25 MG PO TABS
25.0000 mg | ORAL_TABLET | Freq: Once | ORAL | Status: AC
  Administered 2020-04-03: 25 mg via ORAL
  Filled 2020-04-03: qty 1

## 2020-04-03 NOTE — ED Triage Notes (Signed)
Pt was seen at his job to be having difficulty walking & was witnessed to have fallen. Pt denies LOC or hitting his head. While in route to ED EMS reports pt showed some signs of slight confusion. Upon arrival to ED pt is A/Ox4, verbal-able to make needs known. EMS reports that pt stated the only changes in medications for the pt has been a steady increase in his seizure medications over the past 3 weeks (pt cannot recall name of med).

## 2020-04-03 NOTE — ED Provider Notes (Signed)
[facility] EMERGENCY DEPARTMENT Provider Note   CSN: [id number] Arrival date & time: 04/03/20  0753     History Chief Complaint  Patient presents with   Near Syncope    Jeffrey [name] is a 54 y.o. male.  Patient with history of alcohol abuse, cardiac arrest, high cholesterol, hypertension, possible seizures who presents to the ED after near syncope event.  Patient states that he woke up this morning not feeling generally well.  Was able to get to work and while at work he states that a friend noticed that he was not walking very well and shortly afterwards he got dizzy and lightheaded and fell to the ground landing on his hands and knees.  He may have lost conciousness.  He did not have any obvious seizure activity.  He did not hit his head.  He has not felt quite right since feeling a little bit improved.  He denies any active dizziness, nausea, vomiting, chest pain, abdominal pain.  He states that recently he had Depakote medication tapered down to Lamictal.  He is supposed to have an EEG outpatient for further work-up for possible seizures.  The history is provided by the patient.  Near Syncope This is a new problem. The current episode started less than 1 hour ago. The problem has been gradually improving. Pertinent negatives include no chest pain, no abdominal pain, no headaches and no shortness of breath. Nothing aggravates the symptoms. Nothing relieves the symptoms. He has tried rest for the symptoms. The treatment provided mild relief.       Past Medical History:  Diagnosis Date   Acute renal insufficiency 12/08/2012   AKI (acute kidney injury) ([place])    ALCOHOL ABUSE, HX OF 11/10/2007   Anoxic encephalopathy (HCC)    Anxiety    Bleeding ulcer 2014   Blood transfusion 2014   "related to bleeding ulcer"   Cardiac arrest ([place]) 12/07/2012   Anoxic encephalopathy   GERD (gastroesophageal reflux disease)    High cholesterol    Hypertension    OSA  on CPAP 12/07/2012   Seizure-like activity ([place])    Type II diabetes mellitus ([place])    Venous insufficiency 12/13/2011    Patient Active Problem List   Diagnosis Date Noted   Seizures ([place]) 03/16/2020   DM type 2 with diabetic peripheral neuropathy ([place]) 03/16/2020   Tremor 03/16/2020   Colon cancer screening 02/22/2020   Stage 3a chronic kidney disease 01/29/2020   Hyperphosphatemia 01/29/2020   Need for immunization against influenza 06/04/2019   Major depressive disorder, recurrent episode ([place]) 01/13/2018   Seizure ([place]) 04/08/2017   Proteinuria 11/23/2015   Essential hypertension    Hepatic cirrhosis ([place]) 03/22/2015   Ataxia 03/20/2015   Dysarthria 03/09/2015   ETOH abuse    Hyperkalemia    Tachycardia    Erosive esophagitis 10/10/2014   OSA on CPAP 12/28/2012   Cardiac arrest ([place]) 12/07/2012   Anoxic brain injury ([place]) 12/07/2012   ERECTILE DYSFUNCTION 04/14/2007   Type 2 diabetes mellitus with diabetic neuropathy, with long-term current use of insulin ([place]) 12/04/2006   HYPERCHOLESTEROLEMIA 12/04/2006    Past Surgical History:  Procedure Laterality Date   CARDIOVASCULAR STRESS TEST  03/16/13   Very Poor Exercise Tolerance; NON DIAGNOSTIC TEST   CATARACT EXTRACTION W/ INTRAOCULAR LENS IMPLANT Left 03/07/2014   [place] @ [facility]   ESOPHAGOGASTRODUODENOSCOPY Left 11/26/2012   Procedure: ESOPHAGOGASTRODUODENOSCOPY (EGD);  Surgeon: Wonda Horner, MD;  Location: [facility] ENDOSCOPY;  Service: Endoscopy;  Laterality: Left;   ESOPHAGOGASTRODUODENOSCOPY N/A 10/10/2014   Procedure: ESOPHAGOGASTRODUODENOSCOPY (EGD);  Surgeon: Ladene Artist, MD;  Location: [facility] ENDOSCOPY;  Service: Endoscopy;  Laterality: N/A;   KNEE ARTHROSCOPY Left ~ 1999       Family History  Problem Relation Age of Onset   Other Mother        Unsure of medical history.   Diabetes Mellitus II Father     Social History   Tobacco Use   Smoking status: Never  Smoker   Smokeless tobacco: Current User    Types: Snuff  Substance Use Topics   Alcohol use: Yes    Alcohol/week: 6.0 standard drinks    Types: 6 Cans of beer per week   Drug use: No    Home Medications Prior to Admission medications   Medication Sig Start Date End Date Taking? Authorizing Provider  amLODipine (NORVASC) 5 MG tablet Take 1 tablet (5 mg total) by mouth daily. 06/24/19  Yes Hensel, Jamal Collin, MD  APIDRA SOLOSTAR 100 UNIT/ML Solostar Pen INJECT 2-5 UNITS INTO THE SKIN 3 TIMES DAILY DEPENDING ON MEAL SIZE AS DISCUSSED. Patient taking differently: Inject 4-6 Units into the skin See admin instructions. Three times daily depending on meal size. 03/02/20  Yes [name], DO  aspirin EC 81 MG tablet Take 1 tablet (81 mg total) by mouth at bedtime. 06/24/19  Yes Hensel, Jamal Collin, MD  atorvastatin (LIPITOR) 80 MG tablet Take 1 tablet (80 mg total) by mouth at bedtime. 06/24/19  Yes Hensel, Jamal Collin, MD  Dexlansoprazole 30 MG capsule Take 1 capsule (30 mg total) by mouth daily. 07/23/19  Yes Bland, Scott, DO  Diclofenac Sodium 1 % CREA Apply 4 g topically 3 (three) times daily as needed. Patient taking differently: Apply 4 g topically 3 (three) times daily as needed (pain).  03/16/20  Yes Marcial Pacas, MD  divalproex (DEPAKOTE ER) 500 MG 24 hr tablet Take 2 tablets (1,000 mg total) by mouth at bedtime. Patient taking differently: Take 500 mg by mouth at bedtime.  03/16/20  Yes Marcial Pacas, MD  folic acid (FOLVITE) 1 MG tablet Take 1 tablet (1 mg total) by mouth daily. 06/24/19  Yes Hensel, Jamal Collin, MD  gabapentin (NEURONTIN) 300 MG capsule Take 3 capsules (900 mg total) by mouth 4 (four) times daily. 01/20/20  Yes Leavy Cella, RPH-CPP  indapamide (LOZOL) 2.5 MG tablet Take 1 tablet (2.5 mg total) by mouth daily. 07/29/19  Yes Hensel, Jamal Collin, MD  lamoTRIgine (LAMICTAL) 25 MG tablet 1 tab bid x one week 2 tab bid x 2nd week 3 tab bid x 3rd week Patient taking differently: Take 25  mg by mouth See admin instructions. 1 tab bid x one week 2 tab bid x 2nd week 3 tab bid x 3rd week 03/16/20  Yes Marcial Pacas, MD  LANTUS SOLOSTAR 100 UNIT/ML Solostar Pen INJECT 20 UNITS INTO THE SKIN DAILY. Patient taking differently: Inject 20 Units into the skin daily.  03/17/20  Yes Benay Pike, MD  lidocaine-prilocaine (EMLA) cream 4 gram to hands/feet tid prn Patient taking differently: Apply 1 application topically See admin instructions. Apply 4 gram to hands/feet three times daily as needed. 03/16/20  Yes Marcial Pacas, MD  metFORMIN (GLUCOPHAGE-XR) 500 MG 24 hr tablet Take 2 tablets (1,000 mg total) by mouth 2 (two) times daily. 06/24/19  Yes Hensel, Jamal Collin, MD  metoprolol tartrate (LOPRESSOR) 25 MG tablet Take 1 tablet (25 mg total) by mouth  2 (two) times daily. 03/24/20 04/23/20 Yes Bland, Scott, DO  mirtazapine (REMERON) 30 MG tablet Take 1 tablet (30 mg total) by mouth at bedtime. 06/24/19  Yes Hensel, Jamal Collin, MD  Multiple Vitamin (MULTIVITAMIN WITH MINERALS) TABS tablet Take 1 tablet by mouth at bedtime.    Yes [provider]  Semaglutide,0.25 or 0.5MG /DOS, (OZEMPIC, 0.25 OR 0.5 MG/DOSE,) 2 MG/1.5ML SOPN Inject 0.25 mg into the skin once a week. 01/20/20  Yes Leavy Cella, RPH-CPP  sildenafil (VIAGRA) 100 MG tablet Take 1 tablet (100 mg total) by mouth daily as needed for erectile dysfunction. 03/29/20  Yes [name], DO  thiamine (VITAMIN B-1) 100 MG tablet Take 1 tablet (100 mg total) by mouth daily. Patient taking differently: Take 200 mg by mouth daily.  05/27/16  Yes Ronnie Doss M, DO  lamoTRIgine (LAMICTAL) 100 MG tablet Take 1 tablet (100 mg total) by mouth 2 (two) times daily. Patient not taking: Reported on 04/03/2020 03/16/20   Marcial Pacas, MD    Allergies    Lisinopril and Drug ingredient [spironolactone]  Review of Systems   Review of Systems  Constitutional: Positive for fatigue. Negative for chills and fever.  HENT: Negative for ear pain and sore  throat.   Eyes: Negative for pain and visual disturbance.  Respiratory: Negative for cough and shortness of breath.   Cardiovascular: Positive for near-syncope. Negative for chest pain and palpitations.  Gastrointestinal: Negative for abdominal pain and vomiting.  Genitourinary: Negative for dysuria and hematuria.  Musculoskeletal: Negative for arthralgias and back pain.  Skin: Negative for color change and rash.  Neurological: Positive for dizziness, syncope (near) and light-headedness. Negative for tremors, seizures, facial asymmetry, speech difficulty, weakness, numbness and headaches.  All other systems reviewed and are negative.   Physical Exam Updated Vital Signs BP (!) 126/112    Pulse 86    Temp 98.1 F (36.7 C) (Oral)    Resp 20    Ht 5\' 10"  (1.778 m)    SpO2 97%    BMI 39.14 kg/m   Physical Exam Vitals and nursing note reviewed.  Constitutional:      General: He is not in acute distress.    Appearance: He is well-developed. He is not ill-appearing.  HENT:     Head: Normocephalic and atraumatic.     Nose: Nose normal.     Mouth/Throat:     Mouth: Mucous membranes are moist.  Eyes:     Extraocular Movements: Extraocular movements intact.     Conjunctiva/sclera: Conjunctivae normal.     Pupils: Pupils are equal, round, and reactive to light.  Cardiovascular:     Rate and Rhythm: Normal rate and regular rhythm.     Pulses: Normal pulses.     Heart sounds: Normal heart sounds. No murmur heard.   Pulmonary:     Effort: Pulmonary effort is normal. No respiratory distress.     Breath sounds: Normal breath sounds.  Abdominal:     Palpations: Abdomen is soft.     Tenderness: There is no abdominal tenderness.  Musculoskeletal:     Cervical back: Normal range of motion and neck supple.  Skin:    General: Skin is warm and dry.     Capillary Refill: Capillary refill takes less than 2 seconds.  Neurological:     General: No focal deficit present.     Mental Status: He is  alert.     Sensory: No sensory deficit.     Comments: Overall patient appears  to have generalized weakness, moves all extremities well but appears a strength examination is possibly effort dependent but appears to have equal strength throughout, normal sensation, difficulty with finger-to-nose finger, no facial droop, cranial nerves appear grossly intact, speech is intact.  Pupils are equal and reactive.  No visual field deficit.  Patient with the ability to stand but having unsteady gait  Psychiatric:        Mood and Affect: Mood normal.     ED Results / Procedures / Treatments   Labs (all labs ordered are listed, but only abnormal results are displayed) Labs Reviewed  CBC WITH DIFFERENTIAL/PLATELET - Abnormal; Notable for the following components:      Result Value   Hemoglobin 12.5 (*)    RDW 16.7 (*)    All other components within normal limits  BASIC METABOLIC PANEL - Abnormal; Notable for the following components:   Sodium 134 (*)    Potassium 3.4 (*)    Chloride 94 (*)    Glucose, Bld 129 (*)    Creatinine, Ser 1.37 (*)    Calcium 8.7 (*)    GFR calc non Af Amer 58 (*)    All other components within normal limits  HEPATIC FUNCTION PANEL - Abnormal; Notable for the following components:   Albumin 2.9 (*)    All other components within normal limits  URINALYSIS, ROUTINE W REFLEX MICROSCOPIC - Abnormal; Notable for the following components:   Color, Urine STRAW (*)    Specific Gravity, Urine 1.002 (*)    Glucose, UA >=500 (*)    Hgb urine dipstick MODERATE (*)    Protein, ur 100 (*)    All other components within normal limits  VALPROIC ACID LEVEL - Abnormal; Notable for the following components:   Valproic Acid Lvl 32 (*)    All other components within normal limits  ETHANOL - Abnormal; Notable for the following components:   Alcohol, Ethyl (B) 195 (*)    All other components within normal limits  CBG MONITORING, ED - Abnormal; Notable for the following components:    Glucose-Capillary 170 (*)    All other components within normal limits  SARS CORONAVIRUS 2 BY RT PCR (HOSPITAL ORDER, [place] [facility])  RAPID URINE DRUG SCREEN, HOSP PERFORMED  AMMONIA  TROPONIN I (HIGH SENSITIVITY)  TROPONIN I (HIGH SENSITIVITY)    EKG Normal sinus rhythm.  No ischemic changes.  Normal intervals.  EKG would not pull over from muse.  Radiology DG Chest 2 View  Result Date: 04/03/2020 CLINICAL DATA:  Syncope. EXAM: CHEST - 2 VIEW COMPARISON:  November 22, 2017. FINDINGS: The heart size and mediastinal contours are within normal limits. Both lungs are clear. No pneumothorax or pleural effusion is noted. The visualized skeletal structures are unremarkable. IMPRESSION: No active cardiopulmonary disease. Electronically Signed   By: Marijo Conception M.D.   On: 04/03/2020 09:06   CT Head Wo Contrast  Result Date: 04/03/2020 CLINICAL DATA:  Syncope. EXAM: CT HEAD WITHOUT CONTRAST TECHNIQUE: Contiguous axial images were obtained from the base of the skull through the vertex without intravenous contrast. COMPARISON:  November 22, 2017. FINDINGS: Brain: Mild diffuse cortical atrophy is noted. No mass effect or midline shift is noted. Ventricular size is within normal limits. There is no evidence of mass lesion, hemorrhage or acute infarction. Vascular: No hyperdense vessel or unexpected calcification. Skull: Normal. Negative for fracture or focal lesion. Sinuses/Orbits: No acute finding. Other: None. IMPRESSION: Mild diffuse cortical atrophy. No acute  intracranial abnormality seen. Electronically Signed   By: Marijo Conception M.D.   On: 04/03/2020 09:15    Procedures Procedures (including critical care time)  Medications Ordered in ED Medications  sodium chloride 0.9 % bolus 1,000 mL (0 mLs Intravenous Stopped 04/03/20 1233)  meclizine (ANTIVERT) tablet 25 mg (25 mg Oral Given 04/03/20 1046)    ED Course  I have reviewed the triage vital signs and the nursing  notes.  Pertinent labs & imaging results that were available during my care of the patient were reviewed by me and considered in my medical decision making (see chart for details).    MDM Rules/Calculators/A&P                          Jeffrey [name] is a 54 year old male with history of high cholesterol, diabetes, alcohol abuse, seizure-like activity, cardiac arrest who presents to the ED after near syncope/syncope event.  Patient with unremarkable vitals.  No fever.  Patient states that he woke up not feeling very well but was able to get to work.  Checked his blood sugar and it was mildly high in the 200s.  Took his insulin.  When he got to work a [employer] noticed that he was not walking very well and shortly after felt dizzy and fell to the floor landing on his hands and knees.  He did not hit his head but may have loss concsiouness.  He does not think that he had seizure-like activity.  He has felt off but cannot fully describe it.  He appears overall neurologically intact.  Does not give great effort with strength exam but does appear to be moving all extremities with equal strength.  He is able to stand but feels unsteady when trying to walk.  He denies any chest pain or shortness of breath or abdominal pain.  Overall patient seems to be off of his baseline.  He appears to be a little bit slowed with his actions and his response.  Will evaluate with lab work, CT head.  Will pursue infectious work-up, cardiac work-up.  EKG shows sinus rhythm.  No ischemic changes.  Not appear to have had an arrhythmia.  Blood sugar is unremarkable upon arrival in the 170s.  Appears to be less likely a stroke but may need to consider that work-up as well.  May have had seizure type episode as well.  No significant leukocytosis, anemia, electrolyte abnormality.  CT of the head is unremarkable.  Chest x-ray without signs of infection.  Alcohol level about 200.  Ammonia is normal.  Troponin normal.  Overall now suspect  likely alcohol-related near syncope event.  Patient now does admit to some alcohol use last night says that he started drinking around may be midnight.  Will give IV fluid hydration and reevaluate as I believe this is likely the cause of his symptoms today.  Patient was able to ambulate without any issues.  Metabolize alcohol.  Believe that his reason for his issues today.  Discharged from the ED in good condition.  Given return precautions.  This chart was dictated using voice recognition software.  Despite best efforts to proofread,  errors can occur which can change the documentation meaning.    Final Clinical Impression(s) / ED Diagnoses Final diagnoses:  Elevated blood alcohol level, blood alcohol level not specified    Rx / DC Orders ED Discharge Orders    None       Eligh Rybacki,  [place], DO 04/03/20 1628

## 2020-04-03 NOTE — ED Notes (Addendum)
Pt was able to ambulate without assistance.  

## 2020-04-19 ENCOUNTER — Other Ambulatory Visit: Payer: Self-pay

## 2020-04-20 ENCOUNTER — Other Ambulatory Visit: Payer: Self-pay | Admitting: Family Medicine

## 2020-04-20 MED FILL — ?AMLODIPINE BESYLATE 5MG TA: 5 | 30 days supply | Qty: 30 | Fill #3

## 2020-04-20 MED FILL — ATORVASTATIN 80 MG TABLET: 80 | 30 days supply | Qty: 30 | Fill #8

## 2020-04-20 MED FILL — metFORMIN HCL ER 500 MG TB2: 500 | 30 days supply | Qty: 120 | Fill #3

## 2020-04-26 ENCOUNTER — Other Ambulatory Visit: Payer: Self-pay | Admitting: Family Medicine

## 2020-04-26 DIAGNOSIS — I10 Essential (primary) hypertension: Secondary | ICD-10-CM

## 2020-05-01 ENCOUNTER — Other Ambulatory Visit: Payer: Self-pay

## 2020-05-01 ENCOUNTER — Encounter: Payer: Self-pay | Admitting: Nephrology

## 2020-05-01 DIAGNOSIS — E1140 Type 2 diabetes mellitus with diabetic neuropathy, unspecified: Secondary | ICD-10-CM

## 2020-05-01 MED ORDER — LANTUS SOLOSTAR 100 UNIT/ML ~~LOC~~ SOPN: PEN_INJECTOR | SUBCUTANEOUS | 0 refills | Status: DC

## 2020-05-02 ENCOUNTER — Other Ambulatory Visit: Payer: Self-pay | Admitting: Nephrology

## 2020-05-02 ENCOUNTER — Telehealth: Payer: Self-pay | Admitting: *Deleted

## 2020-05-02 DIAGNOSIS — Z794 Long term (current) use of insulin: Secondary | ICD-10-CM

## 2020-05-02 DIAGNOSIS — N1832 Chronic kidney disease, stage 3b: Secondary | ICD-10-CM

## 2020-05-02 MED FILL — INDAPAMIDE 2.5 MG TABLET: 2.5 | 30 days supply | Qty: 30 | Fill #6

## 2020-05-02 NOTE — Telephone Encounter (Signed)
Received fax requesting you to resend New Rx for pack size 15ML. Placed in your box for reference.Jeffrey [name], CMA

## 2020-05-04 MED ORDER — LANTUS SOLOSTAR 100 UNIT/ML ~~LOC~~ SOPN: PEN_INJECTOR | SUBCUTANEOUS | 3 refills | Status: DC

## 2020-05-04 NOTE — Telephone Encounter (Signed)
Order sent for 15 mL to [facility]

## 2020-05-12 MED FILL — ?MIRTAZAPINE 30 MG TABLET: 30 | 30 days supply | Qty: 30 | Fill #8

## 2020-05-12 MED FILL — DIVALPROEX SOD ER 500 MG TA: 500 | 30 days supply | Qty: 60 | Fill #7

## 2020-05-16 MED FILL — GABAPENTIN 300 MG CAPSULE: 300 | 30 days supply | Qty: 360 | Fill #2

## 2020-05-16 MED FILL — ATORVASTATIN 80 MG TABLET: 80 | 30 days supply | Qty: 30 | Fill #9

## 2020-05-19 ENCOUNTER — Other Ambulatory Visit: Payer: Self-pay

## 2020-05-19 DIAGNOSIS — E1142 Type 2 diabetes mellitus with diabetic polyneuropathy: Secondary | ICD-10-CM

## 2020-05-19 DIAGNOSIS — E119 Type 2 diabetes mellitus without complications: Secondary | ICD-10-CM

## 2020-05-19 DIAGNOSIS — E1140 Type 2 diabetes mellitus with diabetic neuropathy, unspecified: Secondary | ICD-10-CM

## 2020-05-19 MED FILL — ?AMLODIPINE BESYLATE 5MG TA: 5 | 30 days supply | Qty: 30 | Fill #4

## 2020-05-20 MED ORDER — DEXLANSOPRAZOLE 30 MG PO CPDR: 30.0000 mg | DELAYED_RELEASE_CAPSULE | Freq: Every day | ORAL | 3 refills | Status: DC

## 2020-05-20 MED ORDER — OZEMPIC (0.25 OR 0.5 MG/DOSE) 2 MG/1.5ML ~~LOC~~ SOPN: 0.2500 mg | PEN_INJECTOR | SUBCUTANEOUS | 1 refills | Status: DC

## 2020-05-20 MED ORDER — APIDRA SOLOSTAR 100 UNIT/ML ~~LOC~~ SOPN: PEN_INJECTOR | SUBCUTANEOUS | 0 refills | Status: DC

## 2020-05-22 ENCOUNTER — Telehealth: Payer: Self-pay | Admitting: *Deleted

## 2020-05-22 ENCOUNTER — Other Ambulatory Visit: Payer: Self-pay | Admitting: Family Medicine

## 2020-05-22 NOTE — Telephone Encounter (Signed)
Received fax stating that pts insurance prefers omeprazole, pantoprazole, rebeprazole, lansoprazole.  Says Insurance requires step therapy or PA for the the dexlant. Hesston Hitchens Zimmerman Rumple, CMA

## 2020-05-23 ENCOUNTER — Telehealth: Payer: Self-pay | Admitting: Hematology and Oncology

## 2020-05-23 ENCOUNTER — Telehealth: Payer: Self-pay

## 2020-05-23 ENCOUNTER — Other Ambulatory Visit: Payer: Self-pay | Admitting: Family Medicine

## 2020-05-23 MED ORDER — DEXCOM G6 SENSOR MISC: 1.0000 [IU] | 3 refills | Status: DC

## 2020-05-23 NOTE — Telephone Encounter (Signed)
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 Mr. Kistler returned my call to schedule an appt on 8/25 at 345pm. Pt aware to arrive 15 minutes early.

## 2020-05-23 NOTE — Telephone Encounter (Signed)
Prescription refill sent for Dexcom sensor

## 2020-05-23 NOTE — Telephone Encounter (Signed)
Fax received from [facility] asking for a refill authorization of the following:  Dexcom G6 Sensor Qty: 3  Did not see on med list.  Ottis Stain, [place]

## 2020-05-26 ENCOUNTER — Telehealth: Payer: Self-pay | Admitting: *Deleted

## 2020-05-26 ENCOUNTER — Other Ambulatory Visit: Payer: Self-pay | Admitting: Family Medicine

## 2020-05-26 NOTE — Telephone Encounter (Signed)
Medication is not covered by pts insurance.  Please see below.  Let "RN Team" know if you want to attempt PA or if you will change med      Cover My Meds info: Key: [id number]  Christen Bame, CMA

## 2020-05-29 ENCOUNTER — Ambulatory Visit
Admission: RE | Admit: 2020-05-29 | Discharge: 2020-05-29 | Disposition: A | Discharge status: Home / Self Care | Payer: 59 | Source: Ambulatory Visit | Attending: Nephrology | Admitting: Nephrology

## 2020-05-29 DIAGNOSIS — N1832 Chronic kidney disease, stage 3b: Secondary | ICD-10-CM

## 2020-05-30 ENCOUNTER — Other Ambulatory Visit: Payer: Self-pay | Admitting: Family Medicine

## 2020-05-30 DIAGNOSIS — I10 Essential (primary) hypertension: Secondary | ICD-10-CM

## 2020-05-30 NOTE — Progress Notes (Signed)
[facility] CONSULT NOTE  Patient Care Team: Derrel Nip, MD as PCP - General (Family Medicine)  CHIEF COMPLAINTS/PURPOSE OF CONSULTATION:  Newly diagnosed MGUS  HISTORY OF PRESENTING ILLNESS:  Jeffrey [name] 54 y.o. male is here because of recent diagnosis of MGUS. He is referred by Dr. Thedore Mins at [facility]. Labs on 05/01/20 showed creatinine 1.30, m-spike 0.3. He presents to the clinic today for initial evaluation.   I reviewed his records extensively and collaborated the history with the patient.  MEDICAL HISTORY:  Past Medical History:  Diagnosis Date  . Acute renal insufficiency 12/08/2012  . AKI (acute kidney injury) (HCC)   . ALCOHOL ABUSE, HX OF 11/10/2007  . Anoxic encephalopathy (HCC)   . Anxiety   . Bleeding ulcer 2014  . Blood transfusion 2014   "related to bleeding ulcer"  . Cardiac arrest (HCC) 12/07/2012   Anoxic encephalopathy  . GERD (gastroesophageal reflux disease)   . High cholesterol   . Hypertension   . OSA on CPAP 12/07/2012  . Seizure-like activity (HCC)   . Type II diabetes mellitus (HCC)   . Venous insufficiency 12/13/2011    SURGICAL HISTORY: Past Surgical History:  Procedure Laterality Date  . CARDIOVASCULAR STRESS TEST  03/16/13   Very Poor Exercise Tolerance; NON DIAGNOSTIC TEST  . CATARACT EXTRACTION W/ INTRAOCULAR LENS IMPLANT Left 03/07/2014   [place] @ [facility]  . ESOPHAGOGASTRODUODENOSCOPY Left 11/26/2012   Procedure: ESOPHAGOGASTRODUODENOSCOPY (EGD);  Surgeon: Graylin Shiver, MD;  Location: [facility] ENDOSCOPY;  Service: Endoscopy;  Laterality: Left;  . ESOPHAGOGASTRODUODENOSCOPY N/A 10/10/2014   Procedure: ESOPHAGOGASTRODUODENOSCOPY (EGD);  Surgeon: Meryl Dare, MD;  Location: [facility] ENDOSCOPY;  Service: Endoscopy;  Laterality: N/A;  . KNEE ARTHROSCOPY Left ~ 1999    SOCIAL HISTORY: Social History   Socioeconomic History  . Marital status: Married    Spouse name: Not on file  . Number of children: 0  . Years  of education: Bachelors  . Highest education level: Not on file  Occupational History  . Occupation: maintenance tech  Tobacco Use  . Smoking status: Never Smoker  . Smokeless tobacco: Current User    Types: Snuff  Substance and Sexual Activity  . Alcohol use: Yes    Alcohol/week: 6.0 standard drinks    Types: 6 Cans of beer per week  . Drug use: No  . Sexual activity: Yes  Other Topics Concern  . Not on file  Social History Narrative   Lives at home with wife. He has four stepchildren.    Right-handed.   No caffeine use.   Social Determinants of Health   Financial Resource Strain:   . Difficulty of Paying Living Expenses: Not on file  Food Insecurity:   . Worried About Programme researcher, broadcasting/film/video in the Last Year: Not on file  . Ran Out of Food in the Last Year: Not on file  Transportation Needs:   . Lack of Transportation (Medical): Not on file  . Lack of Transportation (Non-Medical): Not on file  Physical Activity:   . Days of Exercise per Week: Not on file  . Minutes of Exercise per Session: Not on file  Stress:   . Feeling of Stress : Not on file  Social Connections:   . Frequency of Communication with Friends and Family: Not on file  . Frequency of Social Gatherings with Friends and Family: Not on file  . Attends Religious Services: Not on file  . Active Member of Clubs or  Organizations: Not on file  . Attends Archivist Meetings: Not on file  . Marital Status: Not on file  Intimate Partner Violence:   . Fear of Current or Ex-Partner: Not on file  . Emotionally Abused: Not on file  . Physically Abused: Not on file  . Sexually Abused: Not on file    FAMILY HISTORY: Family History  Problem Relation Age of Onset  . Other Mother        Unsure of medical history.  . Diabetes Mellitus II Father     ALLERGIES:  is allergic to lisinopril and drug ingredient [spironolactone].  MEDICATIONS:  Current Outpatient Medications  Medication Sig Dispense Refill  .  metoprolol tartrate (LOPRESSOR) 25 MG tablet Take 1 tablet by mouth twice daily 60 tablet 0  . sildenafil (VIAGRA) 100 MG tablet TAKE 1 TABLET BY MOUTH ONCE DAILY AS NEEDED FOR ERECTILE DYSFUNCTION 10 tablet 0  . amLODipine (NORVASC) 5 MG tablet Take 1 tablet (5 mg total) by mouth daily. 90 tablet 3  . aspirin EC 81 MG tablet Take 1 tablet (81 mg total) by mouth at bedtime. 90 tablet 3  . atorvastatin (LIPITOR) 80 MG tablet Take 1 tablet (80 mg total) by mouth at bedtime. 90 tablet 3  . Continuous Blood Gluc Sensor (DEXCOM G6 SENSOR) MISC 1 Units by Does not apply route as directed. 1 each 3  . Dexlansoprazole 30 MG capsule Take 1 capsule (30 mg total) by mouth daily. 90 capsule 3  . Diclofenac Sodium 1 % CREA Apply 4 g topically 3 (three) times daily as needed. (Patient taking differently: Apply 4 g topically 3 (three) times daily as needed (pain). ) 120 g 11  . divalproex (DEPAKOTE ER) 500 MG 24 hr tablet Take 2 tablets (1,000 mg total) by mouth at bedtime. (Patient taking differently: Take 500 mg by mouth at bedtime. ) 494 tablet 4  . folic acid (FOLVITE) 1 MG tablet Take 1 tablet (1 mg total) by mouth daily. 90 tablet 3  . gabapentin (NEURONTIN) 300 MG capsule Take 3 capsules (900 mg total) by mouth 4 (four) times daily. 360 capsule 3  . indapamide (LOZOL) 2.5 MG tablet Take 1 tablet (2.5 mg total) by mouth daily. 90 tablet 3  . insulin glargine (LANTUS SOLOSTAR) 100 UNIT/ML Solostar Pen INJECT 20 UNITS INTO THE SKIN DAILY. 15 mL 3  . insulin glulisine (APIDRA SOLOSTAR) 100 UNIT/ML Solostar Pen INJECT 2-5 UNITS INTO THE SKIN 3 TIMES DAILY DEPENDING ON MEAL SIZE AS DISCUSSED. 6 mL 0  . lamoTRIgine (LAMICTAL) 100 MG tablet Take 1 tablet (100 mg total) by mouth 2 (two) times daily. (Patient not taking: Reported on 04/03/2020) 180 tablet 4  . lamoTRIgine (LAMICTAL) 25 MG tablet 1 tab bid x one week 2 tab bid x 2nd week 3 tab bid x 3rd week (Patient taking differently: Take 25 mg by mouth See admin  instructions. 1 tab bid x one week 2 tab bid x 2nd week 3 tab bid x 3rd week) 84 tablet 0  . lidocaine-prilocaine (EMLA) cream 4 gram to hands/feet tid prn (Patient taking differently: Apply 1 application topically See admin instructions. Apply 4 gram to hands/feet three times daily as needed.) 90 g 11  . metFORMIN (GLUCOPHAGE-XR) 500 MG 24 hr tablet Take 2 tablets (1,000 mg total) by mouth 2 (two) times daily. 180 tablet 3  . mirtazapine (REMERON) 30 MG tablet Take 1 tablet (30 mg total) by mouth at bedtime. 90 tablet 3  .  Multiple Vitamin (MULTIVITAMIN WITH MINERALS) TABS tablet Take 1 tablet by mouth at bedtime.     . Semaglutide,0.25 or 0.5MG /DOS, (OZEMPIC, 0.25 OR 0.5 MG/DOSE,) 2 MG/1.5ML SOPN Inject 0.1875 mLs (0.25 mg total) into the skin once a week. 3 mL 1  . thiamine (VITAMIN B-1) 100 MG tablet Take 1 tablet (100 mg total) by mouth daily. (Patient taking differently: Take 200 mg by mouth daily. ) 30 tablet 12   No current facility-administered medications for this visit.    REVIEW OF SYSTEMS:   Constitutional: Denies fevers, chills or abnormal night sweats Eyes: Denies blurriness of vision, double vision or watery eyes Ears, nose, mouth, throat, and face: Denies mucositis or sore throat Respiratory: Denies cough, dyspnea or wheezes Cardiovascular: Denies palpitation, chest discomfort or lower extremity swelling Gastrointestinal:  Denies nausea, heartburn or change in bowel habits Skin: Denies abnormal skin rashes Lymphatics: Denies new lymphadenopathy or easy bruising Neurological:Denies numbness, tingling or new weaknesses Behavioral/Psych: Mood is stable, no new changes  All other systems were reviewed with the patient and are negative.  PHYSICAL EXAMINATION: ECOG PERFORMANCE STATUS: 1 - Symptomatic but completely ambulatory  Vitals:   05/31/20 1537  BP: (!) 146/92  Pulse: (!) 101  Resp: 18  Temp: 98.9 F (37.2 C)  SpO2: 94%   Filed Weights   05/31/20 1537  Weight:  273 lb 14.4 oz (124.2 kg)    GENERAL:alert, no distress and comfortable SKIN: skin color, texture, turgor are normal, no rashes or significant lesions EYES: normal, conjunctiva are pink and non-injected, sclera clear OROPHARYNX:no exudate, no erythema and lips, buccal mucosa, and tongue normal  NECK: supple, thyroid normal size, non-tender, without nodularity LYMPH:  no palpable lymphadenopathy in the cervical, axillary or inguinal LUNGS: clear to auscultation and percussion with normal breathing effort HEART: regular rate & rhythm and no murmurs and no lower extremity edema ABDOMEN:abdomen soft, non-tender and normal bowel sounds Musculoskeletal:no cyanosis of digits and no clubbing  PSYCH: alert & oriented x 3 with fluent speech NEURO: no focal motor/sensory deficits  LABORATORY DATA:  I have reviewed the data as listed Lab Results  Component Value Date   WBC 9.4 04/03/2020   HGB 12.5 (L) 04/03/2020   HCT 40.2 04/03/2020   MCV 90.5 04/03/2020   PLT 256 04/03/2020   Lab Results  Component Value Date   NA 134 (L) 04/03/2020   K 3.4 (L) 04/03/2020   CL 94 (L) 04/03/2020   CO2 25 04/03/2020    RADIOGRAPHIC STUDIES: I have personally reviewed the radiological reports and agreed with the findings in the report.  ASSESSMENT AND PLAN:  MGUS (monoclonal gammopathy of unknown significance) 05/01/2020: M spike 0.3 g, kappa 57, lambda 70, ratio 0.81 WBC 12, Creatinine is 1.3, calcium 9.1, IgG: 1518, IgA 544 IgG lambda monoclonal protein  Counseling: I discussed with the patient the spectrum of disorders from MGUS to multiple myeloma. We discussed the role of plasma cells in producing immunoglobulins. We discussed structure of immunoglobulins on how they make up the heavy chains and the light chains. The light chains are Kappa and lambda. I discussed the difference between MGUS and multiple myeloma. MGUS is characterized by elevation monoclonal protein without any end organ damage.  Multiple myeloma is associated with elevation monoclonal protein and end organ damage (hypercalcemia, renal dysfunction, anemia, bone lytic lesions ) along with a bone marrow showing greater than 10% plasma cells.  Workup recommended: Bone survey I did not recommend a bone marrow biopsy based upon the  blood work.  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  He is very proud of his work and is very happy about it.  We will watch and monitor the monoclonal protein  In 6 months and follow up 1 week later After that we can see him once a year.  All questions were answered. The patient knows to call the clinic with any problems, questions or concerns.   Rulon Eisenmenger, MD, MPH 05/31/2020    I, Molly Dorshimer, am acting as scribe for Nicholas Lose, MD.  I have reviewed the above documentation for accuracy and completeness, and I agree with the above.

## 2020-05-31 ENCOUNTER — Inpatient Hospital Stay: Payer: 59 | Attending: Hematology and Oncology | Admitting: Hematology and Oncology

## 2020-05-31 ENCOUNTER — Other Ambulatory Visit: Payer: Self-pay

## 2020-05-31 DIAGNOSIS — Z794 Long term (current) use of insulin: Secondary | ICD-10-CM

## 2020-05-31 DIAGNOSIS — D472 Monoclonal gammopathy: Secondary | ICD-10-CM | POA: Diagnosis not present

## 2020-05-31 MED ORDER — OZEMPIC (0.25 OR 0.5 MG/DOSE) 2 MG/1.5ML ~~LOC~~ SOPN: 0.5000 mg | PEN_INJECTOR | SUBCUTANEOUS | 1 refills | Status: DC

## 2020-05-31 NOTE — Assessment & Plan Note (Signed)
05/01/2020: M spike 0.3 g, kappa 57, lambda 70, ratio 0.81 WBC 12, Creatinine is 1.3, calcium 9.1, IgG: 1518, IgA 544 IgG lambda monoclonal protein  Counseling: I discussed with the patient the spectrum of disorders from MGUS to multiple myeloma. We discussed the role of plasma cells in producing immunoglobulins. We discussed structure of immunoglobulins on how they make up the heavy chains and the light chains. The light chains are Kappa and lambda. I discussed the difference between MGUS and multiple myeloma. MGUS is characterized by elevation monoclonal protein without any end organ damage. Multiple myeloma is associated with elevation monoclonal protein and end organ damage (hypercalcemia, renal dysfunction, anemia, bone lytic lesions ) along with a bone marrow showing greater than 10% plasma cells.  Workup recommended: Bone survey I did not recommend a bone marrow biopsy based upon the blood work. We will watch and monitor the monoclonal protein every 6 months initially until renal that his M protein is stable after that we can go to once a year.

## 2020-06-01 ENCOUNTER — Telehealth: Payer: Self-pay | Admitting: Hematology and Oncology

## 2020-06-01 NOTE — Telephone Encounter (Signed)
Scheduled appts per 8/25 los. Left voicemail with appt dates and times.

## 2020-06-05 NOTE — Telephone Encounter (Signed)
Pt has an appt with Dr. Caron Presume on 8/31. Ottis Stain, CMA

## 2020-06-06 ENCOUNTER — Ambulatory Visit (INDEPENDENT_AMBULATORY_CARE_PROVIDER_SITE_OTHER): Payer: 59 | Admitting: Family Medicine

## 2020-06-06 ENCOUNTER — Other Ambulatory Visit: Payer: Self-pay

## 2020-06-06 VITALS — BP 104/76 | HR 96 | Wt 267.4 lb

## 2020-06-06 DIAGNOSIS — Z1211 Encounter for screening for malignant neoplasm of colon: Secondary | ICD-10-CM | POA: Diagnosis not present

## 2020-06-06 DIAGNOSIS — E1140 Type 2 diabetes mellitus with diabetic neuropathy, unspecified: Secondary | ICD-10-CM

## 2020-06-06 DIAGNOSIS — K2210 Ulcer of esophagus without bleeding: Secondary | ICD-10-CM | POA: Diagnosis not present

## 2020-06-06 DIAGNOSIS — Z794 Long term (current) use of insulin: Secondary | ICD-10-CM | POA: Diagnosis not present

## 2020-06-06 LAB — POCT GLYCOSYLATED HEMOGLOBIN (HGB A1C): HbA1c, POC (controlled diabetic range): 6.1 % (ref 0.0–7.0)

## 2020-06-06 MED ORDER — OMEPRAZOLE 20 MG PO CPDR: 20.0000 mg | DELAYED_RELEASE_CAPSULE | Freq: Every day | ORAL | 3 refills | Status: DC

## 2020-06-06 MED ORDER — FIASP FLEXTOUCH 100 UNIT/ML ~~LOC~~ SOPN: 4.0000 [IU] | PEN_INJECTOR | Freq: Three times a day (TID) | SUBCUTANEOUS | 0 refills | Status: DC

## 2020-06-06 NOTE — Progress Notes (Signed)
Completed med reconciliation for patient today and updated medication list. Viewed Dexcom data - average blood sugar 137 mg/dL. patient having 71% in range, 17% high, 4% very high, 6% low, and 2% very low. He reports that his blood sugars are low almost every morning. Currently taking 20 units of Lantus daily and 4-8 units of Apidra three times daily with meals. He has run out of Apidra and reports his insurance isn't covering this anymore. Reports he is tolerating Ozempic well, not experiencing any side effects. Since patient is having lows, will decrease Lantus and hold off increasing Ozempic at this time.   A/P:  Continue Ozempic 0.5 mg daily.  Decrease Lantus to 15 units daily.  Provided samples of Fiasp (3 pens).  Schedule follow up visit with Dr. Valentina Lucks in September. Will titrate insulin and Ozempic at follow up as appropriate.    Rebbeca Paul, PharmD PGY1 Pharmacy Resident 06/06/2020 4:48 PM

## 2020-06-06 NOTE — Progress Notes (Signed)
    SUBJECTIVE:   CHIEF COMPLAINT / HPI:   Diabetes  Doing well. Have daily lows. Dexcom is working well, shows very low 2%. Ozempic working well and he is tolerating well.  Patient reports he has had several occurrences where he felt his blood sugars were low and had to drink something with sugar to bring his blood sugars up.  The Dexcom verify there is low blood sugars.  We discussed decreasing the patient's Lantus and patient is agreeable.  Patient will follow up with the pharmacy team in the next month.  GERD Patient reports that he is having GERD signs and symptoms and was diagnosed on medication in the past but his insurance changed and they will not cover the medication we are prescribing.  I wanted to ensure that there was no reason why he had been switch that medication and we will send in a prescription for omeprazole which is insurance will cover.  OBJECTIVE:   BP 104/76   Pulse 96   Wt 267 lb 6.4 oz (121.3 kg)   SpO2 96%   BMI 38.37 kg/m   General: Well-appearing, no acute distress, in good spirits given his great improvement in his A1c Cardiac: Regular rate and rhythm, no murmurs appreciated Respiratory: Normal work of breathing, lungs clear to auscultation bilaterally Abdomen: Soft, nontender, positive bowel sounds Extremities, no wounds or edema noted  Diabetic Foot Form - Detailed   Diabetic Foot Exam - detailed Visual Foot Exam completed.: Yes  Can the patient see the bottom of their feet?: No Are the shoes appropriate in style and fit?: No Is there swelling or and abnormal foot shape?: No Is there a claw toe deformity?: No Is there elevated skin temparature?: No Is there foot or ankle muscle weakness?: No Normal Range of Motion: No  Left posterior Tibialias: Diminished, Present   Left Dorsalis Pedis: Present, Diminished  Semmes-Weinstein Monofilament Test R Site 1-Great Toe: Pos L Site 1-Great Toe: Neg          ASSESSMENT/PLAN:   Erosive  esophagitis Patient reports continued symptoms of reflux.  Has been out of his medications due to insurance issues. -Prescription sent for omeprazole 20 mg daily   Type 2 diabetes mellitus with diabetic neuropathy, with long-term current use of insulin ([place]) Patient has seen great improvement with continuous glucose monitoring.  Hemoglobin A1c is 6.1 from 8.4 at previous check.  Patient is tolerating Ozempic well at the low dose.  He has had some low blood sugars. -Decreased Lantus to 15 units daily -Continue Ozempic 0.25 mg weekly -Patient provided with samples of Fiasp -Patient will follow up with Dr. Everitt Amber in September and they may titrate insulin down and increase Ozempic -Follow-up in 3 months for hemoglobin A1c check     [name], MD [place]

## 2020-06-06 NOTE — Patient Instructions (Addendum)
It was a pleasure to meet you today. Congratulations on the great work with your hemoglobin A1c. I would like for you to bring your Covid vaccination card so that we can document it at your next visit. Today we are going to continue with your Ozempic as prescribed and we are going to decrease your Lantus to 15 units daily. I also prescribed omeprazole to help with your acid reflux. I also sent a referral in for gastroenterology so they can schedule a colonoscopy. Someone will be calling you with that scheduling. If you have any questions or concerns please call our clinic, if not I will see you in 3 months for another diabetes follow-up. You will see Dr. Everitt Amber in the next month regarding medication management. Have a great afternoon!

## 2020-06-07 ENCOUNTER — Telehealth (HOSPITAL_COMMUNITY): Payer: Self-pay

## 2020-06-07 NOTE — Assessment & Plan Note (Signed)
Patient reports continued symptoms of reflux.  Has been out of his medications due to insurance issues. -Prescription sent for omeprazole 20 mg daily

## 2020-06-07 NOTE — Assessment & Plan Note (Signed)
Patient has seen great improvement with continuous glucose monitoring.  Hemoglobin A1c is 6.1 from 8.4 at previous check.  Patient is tolerating Ozempic well at the low dose.  He has had some low blood sugars. -Decreased Lantus to 15 units daily -Continue Ozempic 0.25 mg weekly -Patient provided with samples of Fiasp -Patient will follow up with Dr. Everitt Amber in September and they may titrate insulin down and increase Ozempic -Follow-up in 3 months for hemoglobin A1c check

## 2020-06-13 ENCOUNTER — Ambulatory Visit
Admission: RE | Admit: 2020-06-13 | Discharge: 2020-06-13 | Disposition: A | Discharge status: Home / Self Care | Payer: 59 | Source: Ambulatory Visit | Attending: Nephrology | Admitting: Nephrology

## 2020-06-15 ENCOUNTER — Other Ambulatory Visit: Payer: Self-pay

## 2020-06-15 ENCOUNTER — Ambulatory Visit (HOSPITAL_COMMUNITY)
Admission: RE | Admit: 2020-06-15 | Discharge: 2020-06-15 | Disposition: A | Discharge status: Home / Self Care | Payer: 59 | Source: Ambulatory Visit | Attending: Hematology and Oncology | Admitting: Hematology and Oncology

## 2020-06-15 ENCOUNTER — Ambulatory Visit (HOSPITAL_COMMUNITY): Payer: 59

## 2020-06-15 DIAGNOSIS — D472 Monoclonal gammopathy: Secondary | ICD-10-CM | POA: Insufficient documentation

## 2020-06-15 MED FILL — metFORMIN HCL ER 500 MG TB2: 500 | 30 days supply | Qty: 120 | Fill #4

## 2020-06-15 MED FILL — ATORVASTATIN CALCIUM 80 MG: 80 | 30 days supply | Qty: 30 | Fill #10

## 2020-06-19 ENCOUNTER — Encounter: Payer: Self-pay | Admitting: Family Medicine

## 2020-06-21 MED FILL — AMLODIPINE BESYLATE 5 MG TA: 5 | 30 days supply | Qty: 30 | Fill #5

## 2020-06-22 ENCOUNTER — Encounter: Payer: Self-pay | Admitting: Pharmacist

## 2020-06-22 ENCOUNTER — Other Ambulatory Visit: Payer: Self-pay

## 2020-06-22 ENCOUNTER — Other Ambulatory Visit: Payer: Self-pay | Admitting: *Deleted

## 2020-06-22 ENCOUNTER — Ambulatory Visit (INDEPENDENT_AMBULATORY_CARE_PROVIDER_SITE_OTHER): Payer: 59 | Admitting: Pharmacist

## 2020-06-22 VITALS — BP 100/78 | HR 92

## 2020-06-22 DIAGNOSIS — Z794 Long term (current) use of insulin: Secondary | ICD-10-CM | POA: Diagnosis not present

## 2020-06-22 DIAGNOSIS — E1142 Type 2 diabetes mellitus with diabetic polyneuropathy: Secondary | ICD-10-CM | POA: Diagnosis not present

## 2020-06-22 DIAGNOSIS — E1140 Type 2 diabetes mellitus with diabetic neuropathy, unspecified: Secondary | ICD-10-CM | POA: Diagnosis not present

## 2020-06-22 MED ORDER — LANTUS SOLOSTAR 100 UNIT/ML ~~LOC~~ SOPN: PEN_INJECTOR | SUBCUTANEOUS | 3 refills | Status: DC

## 2020-06-22 MED ORDER — FIASP FLEXTOUCH 100 UNIT/ML ~~LOC~~ SOPN: 8.0000 [IU] | PEN_INJECTOR | Freq: Three times a day (TID) | SUBCUTANEOUS | 0 refills | Status: DC

## 2020-06-22 MED ORDER — OZEMPIC (0.25 OR 0.5 MG/DOSE) 2 MG/1.5ML ~~LOC~~ SOPN: 0.5000 mg | PEN_INJECTOR | SUBCUTANEOUS | 0 refills | Status: DC

## 2020-06-22 NOTE — Assessment & Plan Note (Signed)
Diabetes longstanding and currently worsened however most recent A1c at goal < 7. Medication non-adherence due to inability to obtain GLP-1. Blood sugars are suboptimal due to missed doses of Ozempic. Given controlled A1c, anticipate blood sugars will be at goal when patient resumes GLP-1. -Gave samples of basal insulin Lantus (insulin glargine) 20 units daily. -Continued  rapid insulin Fiasp (insulin aspart w/ niacinamide) 8 units three times daily with meals.  -Gave samples of GLP-1 Ozempic (semaglutide) 0.5 mg once weekly on Fridays. -Continued SGLT2-I Farxiga (dapagliflozin) 10 mg daily.  -Extensively discussed pathophysiology of diabetes, recommended lifestyle interventions, dietary effects on blood sugar control -Counseled on s/sx of and management of hypoglycemia -Next A1C anticipated 09/2020

## 2020-06-22 NOTE — Progress Notes (Signed)
S:     Chief Complaint  Patient presents with  . Medication Management    DM + Dexcom    Patient arrives in good spirits.  Presents for diabetes evaluation, education, and management Patient was referred and last seen by Primary Care Provider on 06/06/20. Patient has been seen in Rx Clinic many times over the last several years.  Insurance coverage/medication affordability: [place] MAP and [place] [facility]  Medication adherence reported to most medications except: - Ran out of Ozempic, last dose 2 weeks ago on 06/09/20. - Taking metformin 1000 mg daily with evening meal instead of BID due to GI intolerance. Current diabetes medications include: lantus 20 units daily, fiasp 8 units three times daily, metformin 1000 mg BID (takes 2 tablets daily in PM), Ozempic 0.5 mg once weekly, Farxiga 10 mg daily Current hypertension medications include: amlodipine 5 mg daily, chlorthalidone 25 mg daily, metoprolol 25 mg BID Current hyperlipidemia medications include: atorvastatin 80 mg daily  Patient denies hypoglycemic events.  Patient denies neuropathy (nerve pain).  Patient reported failure of "generic Sildenafil"  - we discussed contacting his pharmacy to see if they had alternative generic or potentially brand name tx for future refills.  O:  Physical Exam Constitutional:      Appearance: Normal appearance.  Pulmonary:     Effort: Pulmonary effort is normal.  Neurological:     Mental Status: He is alert.  Psychiatric:        Mood and Affect: Mood normal.        Behavior: Behavior normal.        Thought Content: Thought content normal.        Judgment: Judgment normal.    Review of Systems  Cardiovascular: Negative for leg swelling.  All other systems reviewed and are negative.    Lab Results  Component Value Date   HGBA1C 6.1 06/06/2020   Vitals:   06/22/20 1552  BP: 100/78  Pulse: 92  SpO2: 96%    Lipid Panel     Component Value Date/Time   CHOL 158 01/20/2020  1708   TRIG 77 01/20/2020 1708   HDL 75 01/20/2020 1708   CHOLHDL 2.1 01/20/2020 1708   CHOLHDL 4.3 05/27/2016 1123   VLDL 58 (H) 05/27/2016 1123   LDLCALC 68 01/20/2020 1708   LDLDIRECT 89 12/23/2012 1414    Dexcom readings: BG today is 224, did not review other readings today   Clinical Atherosclerotic Cardiovascular Disease (ASCVD): Yes The 10-year ASCVD risk score Mikey Bussing DC Jr., et al., 2013) is: 11.2%   Values used to calculate the score:     Age: 54 years     Sex: Male     Is Non-Hispanic African American: Yes     Diabetic: Yes     Tobacco smoker: No     Systolic Blood Pressure: 409 mmHg     Is BP treated: Yes     HDL Cholesterol: 75 mg/dL     Total Cholesterol: 158 mg/dL    A/P: Diabetes longstanding and currently worsened however most recent A1c at goal < 7. Medication non-adherence due to inability to obtain GLP-1. Blood sugars are suboptimal due to missed doses of Ozempic. Given controlled A1c, anticipate blood sugars will be at goal when patient resumes GLP-1. -Gave samples of basal insulin Lantus (insulin glargine) 20 units daily. -Continued  rapid insulin Fiasp (insulin aspart w/ niacinamide) 8 units three times daily with meals.  -Gave samples of GLP-1 Ozempic (semaglutide) 0.5 mg  once weekly on Fridays. -Continued SGLT2-I Farxiga (dapagliflozin) 10 mg daily.  -Extensively discussed pathophysiology of diabetes, recommended lifestyle interventions, dietary effects on blood sugar control -Counseled on s/sx of and management of hypoglycemia -Next A1C anticipated 09/2020  ASCVD risk - secondary prevention in patient with diabetes. High intensity statin indicated. Aspirin is indicated.  -Continued aspirin 81 mg  -Continued atorvastatin 80 mg.   Hypertension longstanding and currently controlled.  Blood pressure goal = <130/80 mmHg. Medication adherence reported.   -Continued amlodipine 5 mg daily, chlorthalidone 25 mg daily, metoprolol 25 mg BID  Written patient  instructions provided.  Total time in face to face counseling 30 minutes.   Follow up PCP Clinic Visit in 2 months, Pharmacy Visit in 3 months  Patient seen with Fara Olden, PGY1 Pharmacy Resident and Lorel Monaco, PGY2 Pharmacy Resident

## 2020-06-22 NOTE — Patient Instructions (Addendum)
It was nice to see you today!  Your goal blood sugar is 80-130 before eating and less than 180 after eating.  Medication Changes:  Continue all diabetes medications! You are doing great!!  Monitor blood sugars and bring Dexcom reader with you to your next visit.  Keep up the good work with diet and exercise. Aim for a diet full of vegetables, fruit and lean meats (chicken, [place], fish). Try to limit salt intake by eating fresh or frozen vegetables (instead of canned), rinse canned vegetables prior to cooking and do not add any additional salt to meals.   See you in December!

## 2020-06-23 MED ORDER — FOLIC ACID 1 MG PO TABS
1.0000 mg | ORAL_TABLET | Freq: Every day | ORAL | 3 refills | Status: DC
Start: 2020-06-23 — End: 2020-07-04

## 2020-06-26 ENCOUNTER — Telehealth: Payer: Self-pay | Admitting: *Deleted

## 2020-06-26 NOTE — Telephone Encounter (Signed)
This pts folic acid was sent and it was set to fax not normal.  Wanted to be sure PCP sent via fax and if not please resend as normal so it will go to pharmacy. Milania Haubner Zimmerman Rumple, CMA

## 2020-06-26 NOTE — Progress Notes (Signed)
Reviewed: I agree with Dr. Koval's documentation and management. 

## 2020-06-28 ENCOUNTER — Encounter (HOSPITAL_COMMUNITY): Payer: Self-pay

## 2020-06-28 ENCOUNTER — Other Ambulatory Visit: Payer: Self-pay

## 2020-06-28 ENCOUNTER — Observation Stay (HOSPITAL_COMMUNITY)
Admission: EM | Admit: 2020-06-28 | Discharge: 2020-06-29 | Disposition: A | Discharge status: Home / Self Care | Payer: 59 | Attending: Family Medicine | Admitting: Family Medicine

## 2020-06-28 ENCOUNTER — Emergency Department (HOSPITAL_COMMUNITY): Payer: 59

## 2020-06-28 ENCOUNTER — Observation Stay (HOSPITAL_COMMUNITY): Payer: 59

## 2020-06-28 DIAGNOSIS — K7460 Unspecified cirrhosis of liver: Secondary | ICD-10-CM | POA: Diagnosis not present

## 2020-06-28 DIAGNOSIS — R519 Headache, unspecified: Secondary | ICD-10-CM | POA: Insufficient documentation

## 2020-06-28 DIAGNOSIS — K7030 Alcoholic cirrhosis of liver without ascites: Secondary | ICD-10-CM

## 2020-06-28 DIAGNOSIS — E1140 Type 2 diabetes mellitus with diabetic neuropathy, unspecified: Secondary | ICD-10-CM | POA: Diagnosis not present

## 2020-06-28 DIAGNOSIS — Y9389 Activity, other specified: Secondary | ICD-10-CM | POA: Diagnosis not present

## 2020-06-28 DIAGNOSIS — W268XXA Contact with other sharp object(s), not elsewhere classified, initial encounter: Secondary | ICD-10-CM | POA: Diagnosis not present

## 2020-06-28 DIAGNOSIS — Z7982 Long term (current) use of aspirin: Secondary | ICD-10-CM | POA: Insufficient documentation

## 2020-06-28 DIAGNOSIS — Z79899 Other long term (current) drug therapy: Secondary | ICD-10-CM | POA: Diagnosis not present

## 2020-06-28 DIAGNOSIS — N1831 Chronic kidney disease, stage 3a: Secondary | ICD-10-CM

## 2020-06-28 DIAGNOSIS — E871 Hypo-osmolality and hyponatremia: Secondary | ICD-10-CM

## 2020-06-28 DIAGNOSIS — Z20822 Contact with and (suspected) exposure to covid-19: Secondary | ICD-10-CM | POA: Diagnosis not present

## 2020-06-28 DIAGNOSIS — S01112A Laceration without foreign body of left eyelid and periocular area, initial encounter: Principal | ICD-10-CM

## 2020-06-28 DIAGNOSIS — E1169 Type 2 diabetes mellitus with other specified complication: Secondary | ICD-10-CM | POA: Insufficient documentation

## 2020-06-28 DIAGNOSIS — N179 Acute kidney failure, unspecified: Secondary | ICD-10-CM | POA: Diagnosis not present

## 2020-06-28 DIAGNOSIS — I10 Essential (primary) hypertension: Secondary | ICD-10-CM | POA: Insufficient documentation

## 2020-06-28 DIAGNOSIS — E119 Type 2 diabetes mellitus without complications: Secondary | ICD-10-CM

## 2020-06-28 DIAGNOSIS — E785 Hyperlipidemia, unspecified: Secondary | ICD-10-CM

## 2020-06-28 DIAGNOSIS — E876 Hypokalemia: Secondary | ICD-10-CM

## 2020-06-28 DIAGNOSIS — Z794 Long term (current) use of insulin: Secondary | ICD-10-CM | POA: Diagnosis not present

## 2020-06-28 DIAGNOSIS — E1165 Type 2 diabetes mellitus with hyperglycemia: Secondary | ICD-10-CM

## 2020-06-28 LAB — CREATININE, URINE, RANDOM: Creatinine, Urine: 69.28 mg/dL

## 2020-06-28 LAB — URINALYSIS, ROUTINE W REFLEX MICROSCOPIC
Bacteria, UA: NONE SEEN
Bilirubin Urine: NEGATIVE
Glucose, UA: >=500 mg/dL — AB
Ketones, ur: NEGATIVE mg/dL
Leukocytes,Ua: NEGATIVE
Nitrite: NEGATIVE
Protein, ur: 30 mg/dL — AB
Specific Gravity, Urine: 1.009 (ref 1.005–1.030)
pH: 5 (ref 5.0–8.0)

## 2020-06-28 LAB — COMPREHENSIVE METABOLIC PANEL
ALT: 20 U/L (ref 0–44)
AST: 34 U/L (ref 15–41)
Albumin: 3.5 g/dL (ref 3.5–5.0)
Alkaline Phosphatase: 92 U/L (ref 38–126)
Anion gap: 30 — ABNORMAL HIGH (ref 5–15)
BUN: 33 mg/dL — ABNORMAL HIGH (ref 6–20)
CO2: 28 mmol/L (ref 22–32)
Calcium: 9.2 mg/dL (ref 8.9–10.3)
Chloride: 68 mmol/L — ABNORMAL LOW (ref 98–111)
Creatinine, Ser: 2.38 mg/dL — ABNORMAL HIGH (ref 0.61–1.24)
GFR calc Af Amer: 34 mL/min — ABNORMAL LOW (ref >60)
GFR calc non Af Amer: 29 mL/min — ABNORMAL LOW (ref >60)
Glucose, Bld: 118 mg/dL — ABNORMAL HIGH (ref 70–99)
Potassium: 2.5 mmol/L — CL (ref 3.5–5.1)
Sodium: 126 mmol/L — ABNORMAL LOW (ref 135–145)
Total Bilirubin: 0.5 mg/dL (ref 0.3–1.2)
Total Protein: 8.1 g/dL (ref 6.5–8.1)

## 2020-06-28 LAB — OSMOLALITY, URINE: Osmolality, Ur: 282 mOsm/kg — ABNORMAL LOW (ref 300–900)

## 2020-06-28 LAB — HEMOGLOBIN A1C
Hgb A1c MFr Bld: 7.3 % — ABNORMAL HIGH (ref 4.8–5.6)
Mean Plasma Glucose: 162.81 mg/dL

## 2020-06-28 LAB — CBC WITH DIFFERENTIAL/PLATELET
Abs Immature Granulocytes: 0.02 10*3/uL (ref 0.00–0.07)
Basophils Absolute: 0.1 10*3/uL (ref 0.0–0.1)
Basophils Relative: 1 %
Eosinophils Absolute: 0.2 10*3/uL (ref 0.0–0.5)
Eosinophils Relative: 3 %
HCT: 42.5 % (ref 39.0–52.0)
Hemoglobin: 13.7 g/dL (ref 13.0–17.0)
Immature Granulocytes: 0 %
Lymphocytes Relative: 28 %
Lymphs Abs: 2.2 10*3/uL (ref 0.7–4.0)
MCH: 28.1 pg (ref 26.0–34.0)
MCHC: 32.2 g/dL (ref 30.0–36.0)
MCV: 87.1 fL (ref 80.0–100.0)
Monocytes Absolute: 0.7 10*3/uL (ref 0.1–1.0)
Monocytes Relative: 10 %
Neutro Abs: 4.6 10*3/uL (ref 1.7–7.7)
Neutrophils Relative %: 58 %
Platelets: 254 10*3/uL (ref 150–400)
RBC: 4.88 MIL/uL (ref 4.22–5.81)
RDW: 18.4 % — ABNORMAL HIGH (ref 11.5–15.5)
WBC: 7.8 10*3/uL (ref 4.0–10.5)
nRBC: 0 % (ref 0.0–0.2)

## 2020-06-28 LAB — CBG MONITORING, ED
Glucose-Capillary: 103 mg/dL — ABNORMAL HIGH (ref 70–99)
Glucose-Capillary: 115 mg/dL — ABNORMAL HIGH (ref 70–99)
Glucose-Capillary: 157 mg/dL — ABNORMAL HIGH (ref 70–99)
Glucose-Capillary: 174 mg/dL — ABNORMAL HIGH (ref 70–99)
Glucose-Capillary: 178 mg/dL — ABNORMAL HIGH (ref 70–99)
Glucose-Capillary: 183 mg/dL — ABNORMAL HIGH (ref 70–99)

## 2020-06-28 LAB — MAGNESIUM: Magnesium: 2.1 mg/dL (ref 1.7–2.4)

## 2020-06-28 LAB — RAPID URINE DRUG SCREEN, HOSP PERFORMED
Amphetamines: NOT DETECTED
Barbiturates: NOT DETECTED
Benzodiazepines: NOT DETECTED
Cocaine: NOT DETECTED
Opiates: NOT DETECTED
Tetrahydrocannabinol: NOT DETECTED

## 2020-06-28 LAB — VALPROIC ACID LEVEL: Valproic Acid Lvl: 39 ug/mL — ABNORMAL LOW (ref 50.0–100.0)

## 2020-06-28 LAB — ETHANOL: Alcohol, Ethyl (B): 166 mg/dL — ABNORMAL HIGH (ref <10)

## 2020-06-28 LAB — SARS CORONAVIRUS 2 BY RT PCR (HOSPITAL ORDER, PERFORMED IN ~~LOC~~ HOSPITAL LAB): SARS Coronavirus 2: NEGATIVE

## 2020-06-28 LAB — SODIUM, URINE, RANDOM: Sodium, Ur: <10 mmol/L

## 2020-06-28 MED ORDER — LIDOCAINE-EPINEPHRINE 1 %-1:100000 IJ SOLN
10.0000 mL | Freq: Once | INTRAMUSCULAR | Status: AC
  Administered 2020-06-28: 10 mL via INTRADERMAL
  Filled 2020-06-28: qty 1

## 2020-06-28 MED ORDER — MUPIROCIN 2 % EX OINT: TOPICAL_OINTMENT | Freq: Two times a day (BID) | CUTANEOUS | Status: DC

## 2020-06-28 MED ORDER — SODIUM CHLORIDE 0.9% FLUSH: 3.0000 mL | Freq: Two times a day (BID) | INTRAVENOUS | Status: DC

## 2020-06-28 MED ORDER — POTASSIUM CHLORIDE CRYS ER 20 MEQ PO TBCR
40.0000 meq | EXTENDED_RELEASE_TABLET | Freq: Once | ORAL | Status: AC
  Administered 2020-06-28: 40 meq via ORAL
  Filled 2020-06-28: qty 2

## 2020-06-28 MED ORDER — DIVALPROEX SODIUM ER 500 MG PO TB24
1000.0000 mg | ORAL_TABLET | Freq: Two times a day (BID) | ORAL | Status: DC
  Administered 2020-06-28 – 2020-06-29 (×2): 1000 mg via ORAL
  Filled 2020-06-28 (×5): qty 2

## 2020-06-28 MED ORDER — ACETAMINOPHEN 650 MG RE SUPP: 650.0000 mg | Freq: Four times a day (QID) | RECTAL | Status: DC | PRN

## 2020-06-28 MED ORDER — ACETAMINOPHEN 325 MG PO TABS
650.0000 mg | ORAL_TABLET | Freq: Once | ORAL | Status: AC
  Administered 2020-06-28: 650 mg via ORAL
  Filled 2020-06-28: qty 2

## 2020-06-28 MED ORDER — LORAZEPAM 2 MG/ML IJ SOLN: 1.0000 mg | INTRAMUSCULAR | Status: DC | PRN

## 2020-06-28 MED ORDER — ACETAMINOPHEN 325 MG PO TABS
650.0000 mg | ORAL_TABLET | Freq: Four times a day (QID) | ORAL | Status: DC | PRN
  Administered 2020-06-28 – 2020-06-29 (×2): 650 mg via ORAL
  Filled 2020-06-28 (×2): qty 2

## 2020-06-28 MED ORDER — ENOXAPARIN SODIUM 40 MG/0.4ML ~~LOC~~ SOLN
40.0000 mg | SUBCUTANEOUS | Status: DC
  Administered 2020-06-28: 40 mg via SUBCUTANEOUS
  Filled 2020-06-28: qty 0.4

## 2020-06-28 MED ORDER — LACTATED RINGERS IV BOLUS
1000.0000 mL | Freq: Once | INTRAVENOUS | Status: AC
  Administered 2020-06-28: 1000 mL via INTRAVENOUS

## 2020-06-28 MED ORDER — LORAZEPAM 1 MG PO TABS
1.0000 mg | ORAL_TABLET | ORAL | Status: DC | PRN
  Administered 2020-06-29: 1 mg via ORAL
  Filled 2020-06-28: qty 1

## 2020-06-28 MED ORDER — METOPROLOL TARTRATE 25 MG PO TABS
25.0000 mg | ORAL_TABLET | Freq: Two times a day (BID) | ORAL | Status: DC
  Administered 2020-06-29: 25 mg via ORAL
  Filled 2020-06-28: qty 1

## 2020-06-28 MED ORDER — INSULIN ASPART 100 UNIT/ML ~~LOC~~ SOLN
0.0000 [IU] | Freq: Three times a day (TID) | SUBCUTANEOUS | Status: DC
  Administered 2020-06-28: 2 [IU] via SUBCUTANEOUS
  Administered 2020-06-29: 3 [IU] via SUBCUTANEOUS

## 2020-06-28 MED ORDER — SODIUM CHLORIDE 0.9 % IV SOLN: 250.0000 mL | INTRAVENOUS | Status: DC | PRN

## 2020-06-28 MED ORDER — BACITRACIN ZINC 500 UNIT/GM EX OINT
TOPICAL_OINTMENT | Freq: Once | CUTANEOUS | Status: AC
  Filled 2020-06-28: qty 0.9

## 2020-06-28 MED ORDER — AMLODIPINE BESYLATE 5 MG PO TABS: 5.0000 mg | ORAL_TABLET | Freq: Every day | ORAL | Status: DC

## 2020-06-28 MED ORDER — POTASSIUM CHLORIDE 10 MEQ/100ML IV SOLN
10.0000 meq | Freq: Once | INTRAVENOUS | Status: AC
  Administered 2020-06-28: 10 meq via INTRAVENOUS
  Filled 2020-06-28: qty 100

## 2020-06-28 MED ORDER — FOLIC ACID 1 MG PO TABS
1.0000 mg | ORAL_TABLET | Freq: Every day | ORAL | Status: DC
  Administered 2020-06-28 – 2020-06-29 (×2): 1 mg via ORAL
  Filled 2020-06-28 (×2): qty 1

## 2020-06-28 MED ORDER — METOPROLOL TARTRATE 25 MG PO TABS: 25.0000 mg | ORAL_TABLET | Freq: Two times a day (BID) | ORAL | Status: DC

## 2020-06-28 MED ORDER — SODIUM CHLORIDE 0.9 % IV SOLN: INTRAVENOUS | Status: DC

## 2020-06-28 MED ORDER — ATORVASTATIN CALCIUM 80 MG PO TABS
80.0000 mg | ORAL_TABLET | Freq: Every day | ORAL | Status: DC
  Administered 2020-06-28: 80 mg via ORAL
  Filled 2020-06-28: qty 1

## 2020-06-28 MED ORDER — SODIUM CHLORIDE 0.9% FLUSH: 3.0000 mL | INTRAVENOUS | Status: DC | PRN

## 2020-06-28 MED ORDER — PANTOPRAZOLE SODIUM 40 MG PO TBEC
40.0000 mg | DELAYED_RELEASE_TABLET | Freq: Every day | ORAL | Status: DC
  Administered 2020-06-28 – 2020-06-29 (×2): 40 mg via ORAL
  Filled 2020-06-28 (×2): qty 1

## 2020-06-28 MED ORDER — INSULIN GLARGINE 100 UNIT/ML ~~LOC~~ SOLN
10.0000 [IU] | Freq: Every day | SUBCUTANEOUS | Status: DC
  Filled 2020-06-28: qty 0.1

## 2020-06-28 MED ORDER — AMLODIPINE BESYLATE 5 MG PO TABS
5.0000 mg | ORAL_TABLET | Freq: Every day | ORAL | Status: DC
  Administered 2020-06-29: 5 mg via ORAL
  Filled 2020-06-28: qty 1

## 2020-06-28 MED ORDER — TORSEMIDE 20 MG PO TABS: 20.0000 mg | ORAL_TABLET | Freq: Every day | ORAL | Status: DC

## 2020-06-28 NOTE — H&P (Signed)
[facility] Admission History and Physical Service Pager: [phone]  Patient name: Jeffrey [name] Medical record number: [id number] Date of birth: 1966-09-16 Age: 54 y.o. Gender: male  Primary Care Provider: [name], MD Consultants: none Code Status: full  Chief Complaint: head laceration, hypokalemia, hyponatremia  Assessment and Plan: Jeffrey [name] is a 54 y.o. male presenting with head laceration and found to have electrolyte derangements. 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  Hypokalemia  Hyponatremia: Patient found to have K of 2.5 and Na 126 (BL 135-140) in setting of acute alcohol intoxication on admission. Unclear etiology although suspect some contribution from chlorthalidone and chronic heavy alcohol use. No other reported GI loses and baseline urine output. S/p 70mEq potassium and 1L LR bolus in ED. He appears euvolemic on exam. Normal Mag.  - NS at 189mL/hr  - give another 57mEq K-dur now - repeat BMP at 1700 - AM CMP - urine and sodium osmolality, urine sodium, urine creatinine  Acute on CKD IIIa: Cr 2.38 on admission (BL 1.3-1.4). Had renal ultrasound in 06/2020 (ordered by nephrologist) that was normal. Urinating normally thus no obstruction. Differential includes prerenal AKI due to decreased PO intake in setting of excessive alcohol intake and chlorthalidone use. Considering hepatorenal syndrome given h/o liver cirrhosis. Appears euvolemic on exam.  - s/p 1L LR in ED - 172mL/hr NS  - repeat BMP at 1700 - AM CMP - continue to monitor. If no improvement, plan to further evaluate. Consider Protein:Cr ratio - avoid nephrotoxic agents  - UA   Eyebrow Laceration  Patient noted to have left eyebrow laceration after a fall into his car door this morning. Suture repair completed in ED with well approximated edges. CT head negative. Denies LOC or seizure  activity. Normal neuro exam.  - continue daily cleaning, Mupirocin ointment BID - arrange suture removal at discharge  Hypoglycemia  Chronic T2DM  Neuropathy Acute, resolved. Patient found to have BS of 59 per EMS, improved to 118 s/p 15g of glucose. Reports taking insulin this AM without eating. This led to his fall and head laceration. H/o T2DM with last A1C 8.4 (01/2020). Home meds include: Lantus 20U QD, Fiasp (insulin aspart w/ niacinamide) 8U TID, Ozempic 0.5mg  qweekly, and Farxiga 10mg  QD. Last seen in clinic 06/22/20. - start Lantus 10U tomorrow (last dose this AM) - sSSI - consider adding meal coverage if indicated - hold Ozempic and Farxiga  - can readd gabapentin if desired, will need to be renally adjusted if readded given AKI  Hepatic Cirrhosis 2/2 chronic alcohol abuse  Heavy Chronic Alcohol Abuse: Drink about 48 ounces of beer per day. Last drink was last night.  Ethanol level elevated to 166 on admission. Last ultrasound was in 2016 notable for hepatomegaly with diffuse increase in liver echotexture. Suspect hepatic steatosis. Possible underlying parenchymal liver disease, no focal liver lesions. EEG in 2014 and 2016 due to h/o hematemesis and melena but no mention of esophageal varices. Home meds: Torsemide. H/o hyperkalemia on spironolactone. Has been on Lactulose in past but has noncompliant for some time. No asterixis, confusion, or jaundice on exam however does have slurred speech. Normal abdominal girth and no signs of ascites. Platelets 254, Total Bili 0.5. AST/ALT WNL.  - Obtain ammonia level, PT/INR - RUQ ultrasound - CIWA with ativan - folic acid, thiamine, and MTVI - AVOID NSAIDs  Seizures: Chronic. Known history of seizures, first seizure in  2015. Last seizure was June 2021 after seeing Dr. [name]. Notes he never told his neurologist..  Follows with Dr. [name], last seen in June 2021. Home meds: Depakote 1000mg  BID, however patient has only been taking QD. Valproic acid  level 37 (low) on admission.  - start Depakote as prescribed   HTN: BP 118/81 on admission. Home meds include: Amlodipine 5mg  QD, Chlorthalidone 25mg  QD, Metoprolol 25mg  BID (takes 50mg  qHS).  - hold Chlorthalidone given AKI - hold remaining home meds given soft blood pressure, readd when indicated  HLD:  Last lipid panel (01/2020): Chol 158, HDL 75, Trig 77, LDL 68.  Home meds: Atorvastatin 80mg  QD - continue atorvastatin  FEN/GI: Heart healthy/carb modified Prophylaxis: Lovenox  Disposition: med surg, likely home 9/23-9/24  History of Present Illness:  Jeffrey [name] is a 54 y.o. male presenting with head laceration and found to have hypokalemia and hyponatremia.  Patient notes that he was rushing out of his truck this morning to go into work when he tripped and hit the car door and hit his head on the side of the door. He did not loose consciousness. Denies lightheadedness, dizziness, or vision changes before or after this event. Denies headache.   Patient denies any nausea, vomiting, diarrhea. Denies any fevers or chills. Denies any abdominal pain. Denies abdominal distention. He notes he pees a lot normally (due to "fluid pills) and this is unchanged. Denies any difficulty urinating. Denies dysuria, hematuria.   He notes that he drinks 6-8 bottles of water a day.   He chronically has decreased appetite. His last meal was last night and he at half of a sandwich. Drinks 3-4 12-oz beers every evening, last drink was last night. Denies any alcohol this morning. Denies being hospitalized for alcohol withdrawal.   Denies tobacco / nicotine products. Never smoker.  No illicit drugs.    Review Of Systems: Per HPI with the following additions:   Review of Systems  Constitutional: Negative for chills and fever.  HENT: Negative for congestion and sore throat.   Respiratory: Negative for cough, sputum production and shortness of breath.   Cardiovascular: Negative for chest pain and  palpitations.  Gastrointestinal: Negative for abdominal pain, constipation, diarrhea, nausea and vomiting.  Genitourinary: Negative for dysuria, frequency and hematuria.  Neurological: Negative for dizziness, seizures, loss of consciousness, weakness and headaches.    Patient Active Problem List   Diagnosis Date Noted  . MGUS (monoclonal gammopathy of unknown significance) 05/31/2020  . Seizures ([place]) 03/16/2020  . DM type 2 with diabetic peripheral neuropathy ([place]) 03/16/2020  . Tremor 03/16/2020  . Colon cancer screening 02/22/2020  . Stage 3a chronic kidney disease 01/29/2020  . Hyperphosphatemia 01/29/2020  . Need for immunization against influenza 06/04/2019  . Major depressive disorder, recurrent episode ([place]) 01/13/2018  . Seizure ([place]) 04/08/2017  . Proteinuria 11/23/2015  . Essential hypertension   . Hepatic cirrhosis ([place]) 03/22/2015  . Ataxia 03/20/2015  . Dysarthria 03/09/2015  . ETOH abuse   . Hyperkalemia   . Tachycardia   . Erosive esophagitis 10/10/2014  . OSA on CPAP 12/28/2012  . Cardiac arrest ([place]) 12/07/2012  . Anoxic brain injury ([place]) 12/07/2012  . ERECTILE DYSFUNCTION 04/14/2007  . Type 2 diabetes mellitus with diabetic neuropathy, with long-term current use of insulin ([place]) 12/04/2006  . HYPERCHOLESTEROLEMIA 12/04/2006    Past Medical History: Past Medical History:  Diagnosis Date  . Acute renal insufficiency 12/08/2012  . AKI (acute kidney injury) ([place])   . ALCOHOL ABUSE,  HX OF 11/10/2007  . Anoxic encephalopathy ([place])   . Anxiety   . Bleeding ulcer 2014  . Blood transfusion 2014   "related to bleeding ulcer"  . Cardiac arrest ([place]) 12/07/2012   Anoxic encephalopathy  . GERD (gastroesophageal reflux disease)   . High cholesterol   . Hypertension   . OSA on CPAP 12/07/2012  . Seizure-like activity ([place])   . Type II diabetes mellitus ([place])   . Venous insufficiency 12/13/2011    Past Surgical History: Past Surgical History:  Procedure Laterality  Date  . CARDIOVASCULAR STRESS TEST  03/16/13   Very Poor Exercise Tolerance; NON DIAGNOSTIC TEST  . CATARACT EXTRACTION W/ INTRAOCULAR LENS IMPLANT Left 03/07/2014   [place] @ [facility]  . ESOPHAGOGASTRODUODENOSCOPY Left 11/26/2012   Procedure: ESOPHAGOGASTRODUODENOSCOPY (EGD);  Surgeon: Wonda Horner, MD;  Location: [facility] ENDOSCOPY;  Service: Endoscopy;  Laterality: Left;  . ESOPHAGOGASTRODUODENOSCOPY N/A 10/10/2014   Procedure: ESOPHAGOGASTRODUODENOSCOPY (EGD);  Surgeon: Ladene Artist, MD;  Location: [facility] ENDOSCOPY;  Service: Endoscopy;  Laterality: N/A;  . KNEE ARTHROSCOPY Left ~ 1999    Social History: Social History   Tobacco Use  . Smoking status: Never Smoker  . Smokeless tobacco: Current User    Types: Snuff  Substance Use Topics  . Alcohol use: Yes    Alcohol/week: 6.0 standard drinks    Types: 6 Cans of beer per week  . Drug use: No   Additional social history:  Please also refer to relevant sections of EMR.  Family History: Family History  Problem Relation Age of Onset  . Other Mother        Unsure of medical history.  . Diabetes Mellitus II Father    Allergies and Medications: Allergies  Allergen Reactions  . Lisinopril Other (See Comments)    Pt reports nose bleed.  . Drug Ingredient [Spironolactone]     Hyperkalemia   No current facility-administered medications on file prior to encounter.   Current Outpatient Medications on File Prior to Encounter  Medication Sig Dispense Refill  . amLODipine (NORVASC) 5 MG tablet Take 1 tablet (5 mg total) by mouth daily. 90 tablet 3  . aspirin EC 81 MG tablet Take 1 tablet (81 mg total) by mouth at bedtime. 90 tablet 3  . atorvastatin (LIPITOR) 80 MG tablet Take 1 tablet (80 mg total) by mouth at bedtime. 90 tablet 3  . chlorthalidone (HYGROTON) 25 MG tablet Take 25 mg by mouth daily.    . Continuous Blood Gluc Sensor (DEXCOM G6 SENSOR) MISC 1 Units by Does not apply route as directed. 1 each 3  . divalproex  (DEPAKOTE ER) 500 MG 24 hr tablet Take 2 tablets (1,000 mg total) by mouth at bedtime. (Patient taking differently: Take 1,000 mg by mouth 2 (two) times daily. ) 180 tablet 4  . FARXIGA 10 MG TABS tablet Take 10 mg by mouth daily.    . folic acid (FOLVITE) 1 MG tablet Take 1 tablet (1 mg total) by mouth daily. 90 tablet 3  . gabapentin (NEURONTIN) 300 MG capsule Take 3 capsules (900 mg total) by mouth 4 (four) times daily. 360 capsule 3  . insulin aspart (FIASP FLEXTOUCH) 100 UNIT/ML FlexTouch Pen Inject 8 Units into the skin with breakfast, with lunch, and with evening meal. 3 mL 0  . insulin glargine (LANTUS SOLOSTAR) 100 UNIT/ML Solostar Pen INJECT 20 UNITS INTO THE SKIN DAILY. 15 mL 3  . lidocaine-prilocaine (EMLA) cream 4 gram to hands/feet tid prn (Patient  taking differently: Apply 1 application topically See admin instructions. Apply 4 gram to hands/feet three times daily as needed.) 90 g 11  . metFORMIN (GLUCOPHAGE-XR) 500 MG 24 hr tablet Take 2 tablets (1,000 mg total) by mouth 2 (two) times daily. 180 tablet 3  . metoprolol tartrate (LOPRESSOR) 25 MG tablet Take 1 tablet by mouth twice daily 60 tablet 0  . mirtazapine (REMERON) 30 MG tablet Take 1 tablet (30 mg total) by mouth at bedtime. 90 tablet 3  . Multiple Vitamin (MULTIVITAMIN WITH MINERALS) TABS tablet Take 1 tablet by mouth at bedtime.     [name] omeprazole (PRILOSEC) 20 MG capsule Take 1 capsule (20 mg total) by mouth daily. 90 capsule 3  . Semaglutide,0.25 or 0.5MG /DOS, (OZEMPIC, 0.25 OR 0.5 MG/DOSE,) 2 MG/1.5ML SOPN Inject 0.5 mg into the skin once a week. 6 mL 0  . sildenafil (VIAGRA) 100 MG tablet TAKE 1 TABLET BY MOUTH ONCE DAILY AS NEEDED FOR ERECTILE DYSFUNCTION (Patient not taking: Reported on 06/22/2020) 10 tablet 0  . thiamine (VITAMIN B-1) 100 MG tablet Take 1 tablet (100 mg total) by mouth daily. (Patient taking differently: Take 200 mg by mouth daily. ) 30 tablet 12  . torsemide (DEMADEX) 20 MG tablet Take 20 mg by mouth  daily.    . [DISCONTINUED] metoprolol tartrate (LOPRESSOR) 25 MG tablet Take 1 tablet by mouth twice daily 60 tablet 0  . [DISCONTINUED] sildenafil (VIAGRA) 100 MG tablet TAKE 1 TABLET BY MOUTH ONCE DAILY AS NEEDED FOR ERECTILE DYSFUNCTION 10 tablet 0    Objective: BP 114/84 (BP Location: Right Arm)   Pulse 79   Temp (!) 97.4 F (36.3 C) (Oral)   Resp 12   Ht 5' 10.5" (1.791 m)   Wt 115.7 kg   SpO2 97%   BMI 36.07 kg/m  Exam: General: pleasant older male, lying comfortably in ED bed, well nourished, well developed, in no acute distress with non-toxic appearance HEENT: ~3.5cm well-approximated sutured laceration on left eye brow with no bleeding CV: regular rate and rhythm without murmurs, rubs, or gallops, no lower extremity edema, 2+ radial and pedal pulses bilaterally Lungs: clear to auscultation bilaterally with normal work of breathing on room air Resp: breathing comfortably on room air, speaking in full sentences Abdomen: soft, non-tender, large abdominal girth, hepatomegaly appreciated, normoactive bowel sounds Skin: warm, dry Extremities: warm and well perfused Neuro: Alert and oriented, mildly slurred speech  Labs and Imaging: CBC BMET  Recent Labs  Lab 06/28/20 1005  WBC 7.8  HGB 13.7  HCT 42.5  PLT 254   Recent Labs  Lab 06/28/20 1005  NA 126*  K 2.5*  CL 68*  CO2 28  BUN 33*  CREATININE 2.38*  GLUCOSE 118*  CALCIUM 9.2     CT Head Wo Contrast  Result Date: 06/28/2020 CLINICAL DATA:  Headache forehead laceration EXAM: CT HEAD WITHOUT CONTRAST TECHNIQUE: Contiguous axial images were obtained from the base of the skull through the vertex without intravenous contrast. COMPARISON:  04/03/2020 FINDINGS: Brain: No evidence of acute infarction, hemorrhage, hydrocephalus, extra-axial collection or mass lesion/mass effect. Mild cerebral volume loss, unchanged. Vascular: No hyperdense vessel or unexpected calcification. Skull: Normal. Negative for fracture or focal  lesion. Sinuses/Orbits: No acute finding. Other: Left supraorbital soft tissue swelling. IMPRESSION: 1. No acute intracranial findings. 2. Left supraorbital soft tissue swelling. Electronically Signed   By: Davina Poke D.O.   On: 06/28/2020 10:54   Danna Hefty, DO 06/28/2020, 12:42 PM PGY-3, [place]  [facility] FPTS Intern pager: [phone], text pages welcome

## 2020-06-28 NOTE — ED Notes (Signed)
Upon RT arrival, pt refuses CPAP at this time. Pt educated sleep apnea and use of CPAP with verbalized understanding.

## 2020-06-28 NOTE — ED Triage Notes (Signed)
Patient brought in by [facility] from work after he hit his forehead on a sharp corner in his vehicle and caused a three in laceration superior to left eyebrow, bleeding controlled. EMS reports patient was lethargic on scene, patient states he takes depakote for seizures and an unknown sleeping medication and that he is usually groggy in the morning. EMS reports CBG 59, was given 15g oral glucose, bottle of orange juice, EMS repeat CBG 75. Patient alert at this time.

## 2020-06-28 NOTE — ED Notes (Signed)
Patient transported to Ultrasound 

## 2020-06-28 NOTE — ED Notes (Signed)
Spoke with hospitalist regarding CPAP, awaiting orders at this time.

## 2020-06-28 NOTE — ED Notes (Signed)
Attempted to contact wife  Message left

## 2020-06-28 NOTE — Progress Notes (Signed)
RT went to put pt on the CPAP, Pt refused and said he does not wear it at home and he would not like to wear it here. Made nurse aware.

## 2020-06-28 NOTE — ED Notes (Signed)
Pt co of pain, requesting pt medication, pt offered PRN Tylenol for pt, this RN will reassess pt pain per effectiveness.

## 2020-06-28 NOTE — ED Notes (Signed)
RT notified for CPAP placement

## 2020-06-28 NOTE — ED Notes (Signed)
Admitting at bedside 

## 2020-06-28 NOTE — ED Notes (Signed)
Pt noted to desat while sleeping to 70s, pt easily arousalible. Pt reports hx of sleep apnea but denies use of CPAP at home while sleeping. Secretary to page hospitalist.

## 2020-06-28 NOTE — ED Provider Notes (Signed)
[facility] EMERGENCY DEPARTMENT Provider Note   CSN: [id number] Arrival date & time: 06/28/20  5427     History No chief complaint on file.   Jeffrey [name] is a 54 y.o. male.  HPI 54 year old male presents with a left eyebrow laceration.  This morning when getting out of his car for work he hit his head on a sharp corner.  He did not realize how much he was bleeding until he got into work.  He typically takes his insulin before work and then eats when he gets to work but he was unable to eat because of the commotion of his laceration.  Glucose was 59.  Patient states he never lost consciousness and does not have a headache.  No vomiting.  He takes an aspirin but no other blood thinners.  Last drink alcohol last night.   Past Medical History:  Diagnosis Date  . Acute renal insufficiency 12/08/2012  . AKI (acute kidney injury) ([place])   . ALCOHOL ABUSE, HX OF 11/10/2007  . Anoxic encephalopathy ([place])   . Anxiety   . Bleeding ulcer 2014  . Blood transfusion 2014   "related to bleeding ulcer"  . Cardiac arrest ([place]) 12/07/2012   Anoxic encephalopathy  . GERD (gastroesophageal reflux disease)   . High cholesterol   . Hypertension   . OSA on CPAP 12/07/2012  . Seizure-like activity ([place])   . Type II diabetes mellitus ([place])   . Venous insufficiency 12/13/2011    Patient Active Problem List   Diagnosis Date Noted  . MGUS (monoclonal gammopathy of unknown significance) 05/31/2020  . Seizures ([place]) 03/16/2020  . DM type 2 with diabetic peripheral neuropathy ([place]) 03/16/2020  . Tremor 03/16/2020  . Colon cancer screening 02/22/2020  . Stage 3a chronic kidney disease 01/29/2020  . Hyperphosphatemia 01/29/2020  . Need for immunization against influenza 06/04/2019  . Major depressive disorder, recurrent episode ([place]) 01/13/2018  . Seizure ([place]) 04/08/2017  . Proteinuria 11/23/2015  . Essential hypertension   . Hepatic cirrhosis ([place]) 03/22/2015  . Ataxia 03/20/2015    . Dysarthria 03/09/2015  . ETOH abuse   . Hyperkalemia   . Tachycardia   . Erosive esophagitis 10/10/2014  . OSA on CPAP 12/28/2012  . Cardiac arrest ([place]) 12/07/2012  . Anoxic brain injury ([place]) 12/07/2012  . ERECTILE DYSFUNCTION 04/14/2007  . Type 2 diabetes mellitus with diabetic neuropathy, with long-term current use of insulin ([place]) 12/04/2006  . HYPERCHOLESTEROLEMIA 12/04/2006    Past Surgical History:  Procedure Laterality Date  . CARDIOVASCULAR STRESS TEST  03/16/13   Very Poor Exercise Tolerance; NON DIAGNOSTIC TEST  . CATARACT EXTRACTION W/ INTRAOCULAR LENS IMPLANT Left 03/07/2014   [place] @ [facility]  . ESOPHAGOGASTRODUODENOSCOPY Left 11/26/2012   Procedure: ESOPHAGOGASTRODUODENOSCOPY (EGD);  Surgeon: Wonda Horner, MD;  Location: [facility] ENDOSCOPY;  Service: Endoscopy;  Laterality: Left;  . ESOPHAGOGASTRODUODENOSCOPY N/A 10/10/2014   Procedure: ESOPHAGOGASTRODUODENOSCOPY (EGD);  Surgeon: Ladene Artist, MD;  Location: [facility] ENDOSCOPY;  Service: Endoscopy;  Laterality: N/A;  . KNEE ARTHROSCOPY Left ~ 1999       Family History  Problem Relation Age of Onset  . Other Mother        Unsure of medical history.  . Diabetes Mellitus II Father     Social History   Tobacco Use  . Smoking status: Never Smoker  . Smokeless tobacco: Current User    Types: Snuff  Substance Use Topics  . Alcohol use: Yes  Alcohol/week: 6.0 standard drinks    Types: 6 Cans of beer per week  . Drug use: No    Home Medications Prior to Admission medications   Medication Sig Start Date End Date Taking? Authorizing Provider  amLODipine (NORVASC) 5 MG tablet Take 1 tablet (5 mg total) by mouth daily. 06/24/19   Zenia Resides, MD  aspirin EC 81 MG tablet Take 1 tablet (81 mg total) by mouth at bedtime. 06/24/19   Zenia Resides, MD  atorvastatin (LIPITOR) 80 MG tablet Take 1 tablet (80 mg total) by mouth at bedtime. 06/24/19   Zenia Resides, MD  chlorthalidone (HYGROTON) 25 MG  tablet Take 25 mg by mouth daily. 05/01/20   [provider]  Continuous Blood Gluc Sensor (DEXCOM G6 SENSOR) MISC 1 Units by Does not apply route as directed. 05/23/20   [name], MD  divalproex (DEPAKOTE ER) 500 MG 24 hr tablet Take 2 tablets (1,000 mg total) by mouth at bedtime. Patient taking differently: Take 1,000 mg by mouth 2 (two) times daily.  03/16/20   Marcial Pacas, MD  FARXIGA 10 MG TABS tablet Take 10 mg by mouth daily. 05/09/20   [provider]  folic acid (FOLVITE) 1 MG tablet Take 1 tablet (1 mg total) by mouth daily. 06/23/20   [name], MD  gabapentin (NEURONTIN) 300 MG capsule Take 3 capsules (900 mg total) by mouth 4 (four) times daily. 01/20/20   Leavy Cella, RPH-CPP  insulin aspart (FIASP FLEXTOUCH) 100 UNIT/ML FlexTouch Pen Inject 8 Units into the skin with breakfast, with lunch, and with evening meal. 06/22/20   Leavy Cella, RPH-CPP  insulin glargine (LANTUS SOLOSTAR) 100 UNIT/ML Solostar Pen INJECT 20 UNITS INTO THE SKIN DAILY. 06/22/20   Leavy Cella, RPH-CPP  lidocaine-prilocaine (EMLA) cream 4 gram to hands/feet tid prn Patient taking differently: Apply 1 application topically See admin instructions. Apply 4 gram to hands/feet three times daily as needed. 03/16/20   Marcial Pacas, MD  metFORMIN (GLUCOPHAGE-XR) 500 MG 24 hr tablet Take 2 tablets (1,000 mg total) by mouth 2 (two) times daily. 06/24/19   Zenia Resides, MD  metoprolol tartrate (LOPRESSOR) 25 MG tablet Take 1 tablet by mouth twice daily 05/31/20   [name], MD  mirtazapine (REMERON) 30 MG tablet Take 1 tablet (30 mg total) by mouth at bedtime. 06/24/19   Zenia Resides, MD  Multiple Vitamin (MULTIVITAMIN WITH MINERALS) TABS tablet Take 1 tablet by mouth at bedtime.     [provider]  omeprazole (PRILOSEC) 20 MG capsule Take 1 capsule (20 mg total) by mouth daily. 06/06/20   [name], MD  Semaglutide,0.25 or 0.5MG /DOS, (OZEMPIC, 0.25 OR 0.5 MG/DOSE,) 2  MG/1.5ML SOPN Inject 0.5 mg into the skin once a week. 06/22/20   Leavy Cella, RPH-CPP  sildenafil (VIAGRA) 100 MG tablet TAKE 1 TABLET BY MOUTH ONCE DAILY AS NEEDED FOR ERECTILE DYSFUNCTION Patient not taking: Reported on 06/22/2020 05/29/20   [name], MD  thiamine (VITAMIN B-1) 100 MG tablet Take 1 tablet (100 mg total) by mouth daily. Patient taking differently: Take 200 mg by mouth daily.  05/27/16   [name], DO  torsemide (DEMADEX) 20 MG tablet Take 20 mg by mouth daily. 06/02/20   [provider]  metoprolol tartrate (LOPRESSOR) 25 MG tablet Take 1 tablet by mouth twice daily 04/26/20   [name], MD  sildenafil (VIAGRA) 100 MG tablet TAKE 1 TABLET BY MOUTH ONCE DAILY AS NEEDED  FOR ERECTILE DYSFUNCTION 04/21/20   [name], MD    Allergies    Lisinopril and Drug ingredient [spironolactone]  Review of Systems   Review of Systems  Gastrointestinal: Negative for vomiting.  Skin: Positive for wound.  Neurological: Negative for headaches.  All other systems reviewed and are negative.   Physical Exam Updated Vital Signs There were no vitals taken for this visit.  Physical Exam Vitals and nursing note reviewed.  Constitutional:      General: He is not in acute distress.    Appearance: He is well-developed. He is not ill-appearing or diaphoretic.  HENT:     Head: Normocephalic.      Comments: Linear laceration just inferior to his left eyebrow but superior to the eyelid    Right Ear: External ear normal.     Left Ear: External ear normal.     Nose: Nose normal.  Eyes:     General:        Right eye: No discharge.        Left eye: No discharge.  Cardiovascular:     Rate and Rhythm: Normal rate and regular rhythm.     Heart sounds: Normal heart sounds.  Pulmonary:     Effort: Pulmonary effort is normal.     Breath sounds: Normal breath sounds.  Abdominal:     Palpations: Abdomen is soft.     Tenderness: There is no abdominal  tenderness.  Musculoskeletal:     Cervical back: Neck supple.  Skin:    General: Skin is warm and dry.  Neurological:     Mental Status: He is alert and oriented to person, place, and time.     Comments: Mild slurring of his voice. CN 3-12 otherwise grossly intact. 5/5 strength in all 4 extremities. Grossly normal sensation. Normal finger to nose.   Psychiatric:        Mood and Affect: Mood is not anxious.     ED Results / Procedures / Treatments   Labs (all labs ordered are listed, but only abnormal results are displayed) Labs Reviewed  CBG MONITORING, ED    EKG None  Radiology No results found.  Procedures .Marland KitchenLaceration Repair  Date/Time: 06/28/2020 10:32 AM Performed by: Sherwood Gambler, MD Authorized by: Sherwood Gambler, MD   Consent:    Consent obtained:  Verbal   Consent given by:  Patient Anesthesia (see MAR for exact dosages):    Anesthesia method:  Local infiltration   Local anesthetic:  Lidocaine 2% WITH epi Laceration details:    Location:  Face   Face location:  L eyebrow   Length (cm):  3.5 Repair type:    Repair type:  Simple Pre-procedure details:    Preparation:  Patient was prepped and draped in usual sterile fashion Exploration:    Hemostasis achieved with:  Direct pressure Treatment:    Area cleansed with:  Saline   Amount of cleaning:  Standard   Irrigation solution:  Sterile saline   Irrigation method:  Syringe Skin repair:    Repair method:  Sutures   Suture size:  5-0   Suture material:  Fast-absorbing gut   Suture technique:  Simple interrupted   Number of sutures:  5 Approximation:    Approximation:  Close Post-procedure details:    Dressing:  Antibiotic ointment   Patient tolerance of procedure:  Tolerated well, no immediate complications  .Critical Care Performed by: Sherwood Gambler, MD Authorized by: Sherwood Gambler, MD   Critical care provider statement:  Critical care time (minutes):  35   Critical care time was  exclusive of:  Separately billable procedures and treating other patients   Critical care was necessary to treat or prevent imminent or life-threatening deterioration of the following conditions:  Metabolic crisis and renal failure   Critical care was time spent personally by me on the following activities:  Discussions with consultants, evaluation of patient's response to treatment, examination of patient, ordering and performing treatments and interventions, ordering and review of laboratory studies, ordering and review of radiographic studies, pulse oximetry, re-evaluation of patient's condition, obtaining history from patient or surrogate and review of old charts   (including critical care time)  Medications Ordered in ED Medications  lidocaine-EPINEPHrine (XYLOCAINE W/EPI) 2 %-1:200000 (PF) injection 10 mL (has no administration in time range)  sodium chloride flush (NS) 0.9 % injection 3 mL (has no administration in time range)  sodium chloride flush (NS) 0.9 % injection 3 mL (has no administration in time range)  0.9 %  sodium chloride infusion (has no administration in time range)    ED Course  I have reviewed the triage vital signs and the nursing notes.  Pertinent labs & imaging results that were available during my care of the patient were reviewed by me and considered in my medical decision making (see chart for details).  Clinical Course as of Jun 29 1031  Wed Jun 28, 2020  1028 Patient is now complaining of a severe headache. Will get head CT.   [SG]    Clinical Course User Index [SG] Sherwood Gambler, MD   MDM Rules/Calculators/A&P                          Patient presents with minor head injury.  He did develop a headache later so a head CT was obtained and has been personally reviewed and is negative.  I did discuss getting labs because of his hypoglycemia and these have revealed significant electrolyte disturbances including hyponatremia, hypokalemia and acute kidney  injury.  These will be repleted with labs and potassium.  Patient will be admitted to family practice. Final Clinical Impression(s) / ED Diagnoses Final diagnoses:  Eyebrow laceration, left, initial encounter  Acute kidney injury ([place])  Hypokalemia  Hyponatremia  Hepatic cirrhosis ([place])    Rx / DC Orders ED Discharge Orders    None       Sherwood Gambler, MD 06/28/20 1610

## 2020-06-29 ENCOUNTER — Encounter: Payer: Self-pay | Admitting: Family Medicine

## 2020-06-29 DIAGNOSIS — S01112A Laceration without foreign body of left eyelid and periocular area, initial encounter: Secondary | ICD-10-CM | POA: Diagnosis not present

## 2020-06-29 LAB — BASIC METABOLIC PANEL
Anion gap: 13 (ref 5–15)
BUN: 35 mg/dL — ABNORMAL HIGH (ref 6–20)
CO2: 37 mmol/L — ABNORMAL HIGH (ref 22–32)
Calcium: 9 mg/dL (ref 8.9–10.3)
Chloride: 83 mmol/L — ABNORMAL LOW (ref 98–111)
Creatinine, Ser: 2.32 mg/dL — ABNORMAL HIGH (ref 0.61–1.24)
GFR calc Af Amer: 35 mL/min — ABNORMAL LOW (ref >60)
GFR calc non Af Amer: 30 mL/min — ABNORMAL LOW (ref >60)
Glucose, Bld: 183 mg/dL — ABNORMAL HIGH (ref 70–99)
Potassium: 4.1 mmol/L (ref 3.5–5.1)
Sodium: 133 mmol/L — ABNORMAL LOW (ref 135–145)

## 2020-06-29 LAB — CBG MONITORING, ED
Glucose-Capillary: 93 mg/dL (ref 70–99)
Glucose-Capillary: 59 mg/dL — ABNORMAL LOW (ref 70–99)
Glucose-Capillary: 65 mg/dL — ABNORMAL LOW (ref 70–99)
Glucose-Capillary: 242 mg/dL — ABNORMAL HIGH (ref 70–99)
Glucose-Capillary: 235 mg/dL — ABNORMAL HIGH (ref 70–99)

## 2020-06-29 LAB — COMPREHENSIVE METABOLIC PANEL
ALT: 15 U/L (ref 0–44)
AST: 25 U/L (ref 15–41)
Albumin: 2.5 g/dL — ABNORMAL LOW (ref 3.5–5.0)
Alkaline Phosphatase: 78 U/L (ref 38–126)
Anion gap: 14 (ref 5–15)
BUN: 35 mg/dL — ABNORMAL HIGH (ref 6–20)
CO2: 37 mmol/L — ABNORMAL HIGH (ref 22–32)
Calcium: 8.6 mg/dL — ABNORMAL LOW (ref 8.9–10.3)
Chloride: 81 mmol/L — ABNORMAL LOW (ref 98–111)
Creatinine, Ser: 2.31 mg/dL — ABNORMAL HIGH (ref 0.61–1.24)
GFR calc Af Amer: 35 mL/min — ABNORMAL LOW (ref >60)
GFR calc non Af Amer: 30 mL/min — ABNORMAL LOW (ref >60)
Glucose, Bld: 76 mg/dL (ref 70–99)
Potassium: 3.3 mmol/L — ABNORMAL LOW (ref 3.5–5.1)
Sodium: 132 mmol/L — ABNORMAL LOW (ref 135–145)
Total Bilirubin: 0.7 mg/dL (ref 0.3–1.2)
Total Protein: 6.1 g/dL — ABNORMAL LOW (ref 6.5–8.1)

## 2020-06-29 LAB — PROTIME-INR
INR: 1.1 (ref 0.8–1.2)
Prothrombin Time: 14.1 seconds (ref 11.4–15.2)

## 2020-06-29 LAB — TSH: TSH: 3.053 u[IU]/mL (ref 0.350–4.500)

## 2020-06-29 LAB — HIV ANTIBODY (ROUTINE TESTING W REFLEX): HIV Screen 4th Generation wRfx: NONREACTIVE

## 2020-06-29 LAB — CBC
HCT: 34.7 % — ABNORMAL LOW (ref 39.0–52.0)
Hemoglobin: 11.3 g/dL — ABNORMAL LOW (ref 13.0–17.0)
MCH: 27.6 pg (ref 26.0–34.0)
MCHC: 32.6 g/dL (ref 30.0–36.0)
MCV: 84.8 fL (ref 80.0–100.0)
Platelets: 222 10*3/uL (ref 150–400)
RBC: 4.09 MIL/uL — ABNORMAL LOW (ref 4.22–5.81)
RDW: 18.4 % — ABNORMAL HIGH (ref 11.5–15.5)
WBC: 7.6 10*3/uL (ref 4.0–10.5)
nRBC: 0 % (ref 0.0–0.2)

## 2020-06-29 LAB — AMMONIA: Ammonia: 35 umol/L (ref 9–35)

## 2020-06-29 MED ORDER — METOPROLOL SUCCINATE ER 50 MG PO TB24: 50.0000 mg | ORAL_TABLET | Freq: Every day | ORAL | 0 refills | Status: DC

## 2020-06-29 MED ORDER — GABAPENTIN 300 MG PO CAPS
900.0000 mg | ORAL_CAPSULE | Freq: Three times a day (TID) | ORAL | 0 refills | Status: DC
Start: 2020-06-29 — End: 2020-09-12

## 2020-06-29 MED ORDER — METFORMIN HCL ER 500 MG PO TB24: 500.0000 mg | ORAL_TABLET | Freq: Every day | ORAL | 0 refills | Status: DC

## 2020-06-29 MED ORDER — POTASSIUM CHLORIDE CRYS ER 20 MEQ PO TBCR
30.0000 meq | EXTENDED_RELEASE_TABLET | ORAL | Status: AC
  Administered 2020-06-29 (×2): 30 meq via ORAL
  Filled 2020-06-29 (×2): qty 1

## 2020-06-29 NOTE — ED Notes (Addendum)
Pt given breakfast tray

## 2020-06-29 NOTE — ED Notes (Signed)
Pt resting comfortably, no tremor noted in sleep. Will do full CIWA when pt wakes.

## 2020-06-29 NOTE — Discharge Summary (Addendum)
[facility] Discharge Summary  Patient name: Jeffrey [name] Medical record number: [id number] Date of birth: 1966/04/23 Age: 54 y.o. Gender: male Date of Admission: 06/28/2020  Date of Discharge: 06/29/2020 Admitting Physician: Danna Hefty, DO  Primary Care Provider: [name], MD Consultants: None  Indication for Hospitalization: Electrolyte Abnormalities  Discharge Diagnoses/Problem List:  Electrolyte Abnormalities Acute on CKD IIIa EyebrowLaceration  Hypoglycemia (resolved)  Chronic T2DM  Neuropathy Hepatic Cirrhosis 2/2 chronic alcohol abuse Heavy Chronic Alcohol Abuse: Seizures HTN  Disposition: Home  Discharge Condition: stable  Discharge Exam: Physical Exam Vitals and nursing note reviewed.  Constitutional:      General: He is not in acute distress.    Appearance: Normal appearance. He is not ill-appearing, toxic-appearing or diaphoretic.  HENT:     Head:      Comments: Sutured laceration with swelling and discoloration. Wound clean and dry. Cardiovascular:     Rate and Rhythm: Normal rate and regular rhythm.     Pulses: Normal pulses.          Radial pulses are 2+ on the right side and 2+ on the left side.       Dorsalis pedis pulses are 2+ on the right side and 2+ on the left side.     Heart sounds: Normal heart sounds, S1 normal and S2 normal. No murmur heard.   Pulmonary:     Effort: Pulmonary effort is normal. No respiratory distress.     Breath sounds: Normal breath sounds. No wheezing.  Abdominal:     Tenderness: There is no abdominal tenderness. There is no guarding.  Musculoskeletal:     Right lower leg: No edema.     Left lower leg: No edema.  Neurological:     Mental Status: He is alert. Mental status is at baseline.    Brief Hospital Course:  Jeffrey [name] is a 54 y.o. male presenting with head laceration and found to have electrolyte derangements d/t EtOH SUD. PMH is significant for T2DM, ED,  HTN, Hepatic cirrhosis 2/2 chronic alcohol abuse, HLD, MDD, MGUS, CKD III, seizures.  Hepatic Cirrhosis 2/2 chronic alcohol abuse Heavy Chronic Alcohol Abuse: Patient reports drinking 48 ounces of beer per day. On admission ethanol level measured to be 166. No asterixis, confusion, or jaundice, normal abdominal girth and no signs of ascites. He had slurred speech. He was placed on CIWA with ativan protocol. He was given folic acid, thiamine and MTVI. RUQ US performed and found no evidence of acute abnormalities with appearance consistent with cirrhosis. MELD score calculated to be 20, with 20% mortality in the next 3 months. Will require outpatient follow up with GI for further counseling and variceal monitoring.  Electrolyte Abnormalities: On admission he was found to have Na of 126 and K of 2.5. He received IV fluid resuscitation and potassium repletion. On repeat CMP on 9/23 Na was 132 and K was 3.3. With significant improvement, he was given further potassium repletion and discharged home.  Acute on CKD IIIa: On admission creatinine was found to be 2.38 (baseline is 1.3-1.4). Renal US from 06/2020 was normal and he reports urinating normally. The following morning his Creatinine had only minimally improved to 2.31, will plan to continue monitoring for improvement at outpatient appointment.   EyebrowLaceration: Repaired in ED with chromic gut, will absorb and no need for follow up in clinic for removal. Wound was well approximated and c/d/i.   Hypoglycemia (resolved)  Chronic T2DM  Neuropathy: A1C is  7.3% which shows improvement of control. EMS obtained a CBG and found it to be 59. He was given 15 g of glucose and repeat CBG was found to be 118. Patient reported this was due to taking morning insulin without eating breakfast. Discussed importance of eating with insulin. CBGs were appropriate throughout rest of admission. In the setting of AKI his gabapentin was held. He will resume this outpatient  for his neuropathy.   Seizures: Has a known history of seizures and he was stable while admitted with no seizure like activity noted. Valproic acid level was low at 37. He was restarted on his home dose of Depakote, will need to address how he takes his medication at follow up outpatient appointment.  HTN: On admission to ED pressures were normotensive to soft. Home medications where held. In the setting of AKI Chlorthalidone was held, may restart once AKI resolves.  Issues for Follow Up:  1.Hepatic Cirrhosis 2/2 chronic alcohol abuse Heavy Chronic Alcohol Abuse: Reports drinking 48 ounces of beer per day. MELD score calculated as 20,  Indicating 20% mortality in the next 3 months. Provide counseling as needed, establish outpatient visit to GI for further counseling and variceal monitoring.   2. Electrolyte Abnormalities: Obtain repeat BMP to monitor for any further disturbances. Replete further if needed.  3. Acute on CKD IIIa: Obtain repeat BMP to monitor for continued improvement of creatinine, may need referral to nephrology as this could represent a progression of CKD rather than acute injury.  4. EyebrowLaceration: Wound was sutured and clean on discharge. Sutures are dissolving, ensure that wound is not infected.  5. Hypoglycemia (resolved)  Chronic T2DM  Neuropathy: A1C is 7.3 which shows improvement of control. Continue to work on diet and control to maintain good control and appropriate use of medications.  6. Seizures: There are questions about how he is taking his medication. Please clarify dosing and frequency of medication.  7. HTN: Was mostly normotensive while admitted, Chlorthalidone was held in the setting of AKI. Continue to monitor and make adjustments of medications as needed and possibly restart Chlorthalidone if indicated.  Significant Procedures: None  Significant Labs and Imaging:  Recent Labs  Lab 06/28/20 1005 06/29/20 0621  WBC 7.8 7.6  HGB 13.7 11.3*    HCT 42.5 34.7*  PLT 254 222   Recent Labs  Lab 06/28/20 1005 06/28/20 1005 06/28/20 1430 06/29/20 0621 06/29/20 1335  NA 126*  --   --  132* 133*  K 2.5*   < >  --  3.3* 4.1  CL 68*  --   --  81* 83*  CO2 28  --   --  37* 37*  GLUCOSE 118*  --   --  76 183*  BUN 33*  --   --  35* 35*  CREATININE 2.38*  --   --  2.31* 2.32*  CALCIUM 9.2  --   --  8.6* 9.0  MG  --   --  2.1  --   --   ALKPHOS 92  --   --  78  --   AST 34  --   --  25  --   ALT 20  --   --  15  --   ALBUMIN 3.5  --   --  2.5*  --    < > = values in this interval not displayed.    US Abdomen Limited RUQ: No acute abnormality. Coarsened heterogeneously increased hepatic parenchymal echogenicity consistent with provided history  of cirrhosis. Contracted gallbladder with mild wall thickening, likely related to underdistention.   TSH: 3.053 Ammonia: 35 PT-INR: 14.1 MELD-Na score: 18 at 06/29/2020  1:35 PM MELD score: 15 at 06/29/2020  1:35 PM Calculated from: Serum Creatinine: 2.32 mg/dL at 06/29/2020  1:35 PM Serum Sodium: 133 mmol/L at 06/29/2020  1:35 PM Total Bilirubin: 0.7 mg/dL (Using min of 1 mg/dL) at 06/29/2020  6:21 AM INR(ratio): 1.1 at 06/29/2020  6:21 AM Age: 10 years   Results/Tests Pending at Time of Discharge: None  Discharge Medications:  Allergies as of 06/29/2020      Reactions   Lisinopril Other (See Comments)   Pt reports nose bleed.   Drug Ingredient [spironolactone]    Hyperkalemia      Medication List    STOP taking these medications   chlorthalidone 25 MG tablet Commonly known as: HYGROTON   metoprolol tartrate 25 MG tablet Commonly known as: LOPRESSOR     TAKE these medications   amLODipine 5 MG tablet Commonly known as: NORVASC Take 1 tablet (5 mg total) by mouth daily.   aspirin EC 81 MG tablet Take 1 tablet (81 mg total) by mouth at bedtime.   atorvastatin 80 MG tablet Commonly known as: LIPITOR Take 1 tablet (80 mg total) by mouth at bedtime.   Dexcom G6  Sensor Misc 1 Units by Does not apply route as directed.   divalproex 500 MG 24 hr tablet Commonly known as: Depakote ER Take 2 tablets (1,000 mg total) by mouth at bedtime. What changed: when to take this   Farxiga 10 MG Tabs tablet Generic drug: dapagliflozin propanediol Take 10 mg by mouth daily.   Fiasp FlexTouch 100 UNIT/ML FlexTouch Pen Generic drug: insulin aspart Inject 8 Units into the skin with breakfast, with lunch, and with evening meal.   folic acid 1 MG tablet Commonly known as: FOLVITE Take 1 tablet (1 mg total) by mouth daily.   gabapentin 300 MG capsule Commonly known as: NEURONTIN Take 3 capsules (900 mg total) by mouth 3 (three) times daily. What changed: when to take this   Lantus SoloStar 100 UNIT/ML Solostar Pen Generic drug: insulin glargine INJECT 20 UNITS INTO THE SKIN DAILY. What changed:   how much to take  how to take this  when to take this   lidocaine-prilocaine cream Commonly known as: EMLA 4 gram to hands/feet tid prn What changed:   how much to take  how to take this  when to take this  additional instructions   metFORMIN 500 MG 24 hr tablet Commonly known as: GLUCOPHAGE-XR Take 1 tablet (500 mg total) by mouth daily with breakfast. What changed:   how much to take  when to take this   metoprolol succinate 50 MG 24 hr tablet Commonly known as: Toprol XL Take 1 tablet (50 mg total) by mouth daily. Take with or immediately following a meal.   mirtazapine 30 MG tablet Commonly known as: REMERON Take 1 tablet (30 mg total) by mouth at bedtime.   multivitamin with minerals Tabs tablet Take 1 tablet by mouth at bedtime.   omeprazole 20 MG capsule Commonly known as: PRILOSEC Take 1 capsule (20 mg total) by mouth daily.   Ozempic (0.25 or 0.5 MG/DOSE) 2 MG/1.5ML Sopn Generic drug: Semaglutide(0.25 or 0.5MG /DOS) Inject 0.5 mg into the skin once a week. What changed: additional instructions   sildenafil 100 MG  tablet Commonly known as: VIAGRA TAKE 1 TABLET BY MOUTH ONCE DAILY AS NEEDED FOR ERECTILE DYSFUNCTION  thiamine 100 MG tablet Commonly known as: Vitamin B-1 Take 1 tablet (100 mg total) by mouth daily.   torsemide 20 MG tablet Commonly known as: DEMADEX Take 20 mg by mouth daily.       Discharge Instructions: Please refer to Patient Instructions section of EMR for full details.  Patient was counseled important signs and symptoms that should prompt return to medical care, changes in medications, dietary instructions, activity restrictions, and follow up appointments.   Follow-Up Appointments:  Follow-up Paul. Go on 07/03/2020.   Specialty: Family Medicine Why: Please arrive by 9:55 AM for 10:10 appointment Contact information: [address] [phone] [place]              Briant Cedar, MD 06/29/2020, 6:56 PM PGY-1, [place]

## 2020-06-29 NOTE — Progress Notes (Signed)
Family Medicine Teaching Service Daily Progress Note Intern Pager: [phone]  Patient name: Jeffrey [name] Medical record number: [id number] Date of birth: [birth year] Age: 54 y.o. Gender: male  Primary Care Provider: [name], MD Consultants: None Code Status: Full  Pt Overview and Major Events to Date:  Admitted on 9/22  Assessment and Plan:  Jeffrey [name] is a 54 y.o. male presenting with head laceration and found to have electrolyte derangements. 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   Hypokalemia  Hyponatremia: Patient found to have K of 2.5 and Na 126. Unclear etiology although suspect some contribution from chlorthalidone and chronic heavy alcohol use. No other reported GI loses and baseline urine output. Urine creatinine is 69.28, Urine Na is <10, Urine Osmolality is 282, UA showed glucose >500, small Hgb, and 30 protein. This AM Na is 132 and K is 3.3.  -NS stopped -Kdur 30 mEq twice ordered  -Repeat BMP at 1400   Acute on CKD IIIa: Cr 2.38 on admission (BL 1.3-1.4). Had renal ultrasound in 06/2020 (ordered by nephrologist) that was normal. Urinating normally thus no obstruction. Differential includes prerenal AKI due to decreased PO intake in setting of excessive alcohol intake and chlorthalidone use. Considering hepatorenal syndrome given h/o liver cirrhosis. Creatinine today is 2.31. Will follow up outpatient. -Repeat BMP at 1400 -Continue to monitor -Avoid nephrotoxic agents  -UA    Eyebrow Laceration  Patient noted to have left eyebrow laceration after a fall into his car door this morning. Suture repair completed in ED with well approximated edges. CT head negative. Denies LOC or seizure activity. Normal neuro exam.  -Continue daily cleaning, Mupirocin ointment BID -Suture are dissolvable   Hypoglycemia (resolved)  Chronic T2DM  Neuropathy A1c is 7.3.. Home meds  include: Lantus 20U QD, Fiasp (insulin aspart w/ niacinamide) 8U TID, Ozempic 0.5mg  qweekly, and Farxiga 10mg  QD. Last seen in clinic 06/22/20. -Start Lantus 10U -sSSI -Consider adding meal coverage if needed -Hold Ozempic and Farxiga  -Can readd gabapentin if desired, will need to be renally adjusted due to AKI   Hepatic Cirrhosis 2/2 chronic alcohol abuse  Heavy Chronic Alcohol Abuse: Drink about 48 ounces of beer per day. Last drink was last night.  Ethanol level elevated to 166 on admission. Last ultrasound was in 2016 notable for hepatomegaly with diffuse increase in liver echotexture. Suspect hepatic steatosis. Possible underlying parenchymal liver disease, no focal liver lesions. EEG in 2014 and 2016 due to h/o hematemesis and melena but no mention of esophageal varices. Home meds: Torsemide. H/o hyperkalemia on spironolactone. Has been on Lactulose in past but has noncompliant for some time. No asterixis, confusion, or jaundice on exam however does have slurred speech. Normal abdominal girth and no signs of ascites. Platelets 222, Total Bili 0.5. AST/ALT WNL. GI follow up outpatient for monitoring. MELD-Na score: 19 at 06/29/2020  6:21 AM MELD score: 15 at 06/29/2020  6:21 AM Calculated from: Serum Creatinine: 2.31 mg/dL at 06/29/2020  6:21 AM Serum Sodium: 132 mmol/L at 06/29/2020  6:21 AM Total Bilirubin: 0.7 mg/dL (Using min of 1 mg/dL) at 06/29/2020  6:21 AM INR(ratio): 1.1 at 06/29/2020  6:21 AM Age: 2 years  -Obtain ammonia level, PT/INR -CIWA with ativan -folic acid, thiamine, and MTVI -AVOID NSAIDs   Seizures: Chronic. Known history of seizures, first seizure in 2015. Last seizure was June 2021 after seeing Dr. [name]. Notes he never told his neurologist..  Follows  with Dr. [name], last seen in June 2021. Home meds: Depakote 1000mg  BID, however patient has only been taking QD. Valproic acid level 37 (low) on admission.  -Depakote ER 1 g BID   HTN: BP 118/81 on admission. Home meds  include: Amlodipine 5mg  QD, Chlorthalidone 25mg  QD, Metoprolol 25mg  BID (takes 50mg  qHS).  - hold Chlorthalidone given AKI - hold remaining home meds given soft blood pressure, readd when indicated   HLD:  Last lipid panel (01/2020 Home meds: Atorvastatin. -Continue Atorvastatin 80 mg daily   FEN/GI: Heart healthy/carb modified PPx: Lovenox  Disposition: med surg, likely home 9/23-9/24  Prior to Admission Living Arrangement: Home Anticipated Discharge Location: Home Barriers to Discharge: Electrolyte Abnomalities Anticipated discharge in approximately 1-3 day(s).   Subjective:  Interviewed patient bedside in the ED.  He reports feeling ok today. Discussed improvement in his electrolytes and possibility of discharge today. He was agreeable to this.  Objective: Temp:  [97.4 F (36.3 C)] 97.4 F (36.3 C) (09/22 0914) Pulse Rate:  [75-99] 87 (09/23 0500) Resp:  [12-23] 18 (09/23 0500) BP: (102-147)/(60-106) 108/79 (09/23 0500) SpO2:  [91 %-100 %] 91 % (09/23 0500) Weight:  [115.7 kg] 115.7 kg (09/22 0953) Physical Exam:  Physical Exam Vitals and nursing note reviewed.  Constitutional:      General: He is not in acute distress.    Appearance: Normal appearance. He is not ill-appearing, toxic-appearing or diaphoretic.  HENT:     Head: Normocephalic and atraumatic.  Cardiovascular:     Rate and Rhythm: Normal rate and regular rhythm.     Pulses: Normal pulses.     Heart sounds: Normal heart sounds. No murmur heard.   Pulmonary:     Effort: Pulmonary effort is normal. No respiratory distress.     Breath sounds: Normal breath sounds. No wheezing.  Abdominal:     Tenderness: There is no abdominal tenderness.  Musculoskeletal:     Right lower leg: No edema.     Left lower leg: No edema.  Neurological:     Mental Status: He is alert. Mental status is at baseline.      Laboratory: Recent Labs  Lab 06/28/20 1005  WBC 7.8  HGB 13.7  HCT 42.5  PLT 254   Recent  Labs  Lab 06/28/20 1005  NA 126*  K 2.5*  CL 68*  CO2 28  BUN 33*  CREATININE 2.38*  CALCIUM 9.2  PROT 8.1  BILITOT 0.5  ALKPHOS 92  ALT 20  AST 34  GLUCOSE 118*    Urine creatinine is 69.28 (WNL) Urine Na is <10 (WNL) Urine Osmolality: 282 (Low) UA: glucose >500, small Hgb, and 30 protein. TSH: 3.053 (WNL) Ammonia: 35 (WNL)   Imaging/Diagnostic Tests:   US Abdomen Limited RUQ: No acute abnormality. Coarsened heterogeneously increased hepatic parenchymal echogenicity consistent with provided history of cirrhosis. Contracted gallbladder with mild wall thickening, likely related to underdistention.   CT Head WO Contrast: No acute intracranial findings. Left supraorbital soft tissue swelling.   Briant Cedar, MD 06/29/2020, 6:47 AM PGY-1, [place] Intern pager: [phone], text pages welcome

## 2020-06-29 NOTE — Progress Notes (Signed)
FPTS Interim Progress Note  CBG's soft this AM in 50-60's. Hold AM Lantus until improved. Nurse notified.  Mina Marble Plato, DO 06/29/2020, 9:32 AM PGY-3, [place] Medicine Service pager [phone]

## 2020-06-29 NOTE — Hospital Course (Addendum)
DC: Cr f/u, long acting metoprolol, Gabapentin TID, hold Chlorthalidone, dont need sutures removed (disolvable), GI referral for cirrhosis monitoring, Metformin 500mg  QD

## 2020-06-30 ENCOUNTER — Other Ambulatory Visit: Payer: Self-pay | Admitting: Family Medicine

## 2020-07-03 ENCOUNTER — Ambulatory Visit (INDEPENDENT_AMBULATORY_CARE_PROVIDER_SITE_OTHER): Payer: 59 | Admitting: Family Medicine

## 2020-07-03 ENCOUNTER — Other Ambulatory Visit: Payer: Self-pay | Admitting: Pharmacist

## 2020-07-03 ENCOUNTER — Other Ambulatory Visit: Payer: Self-pay

## 2020-07-03 VITALS — BP 124/78 | HR 96 | Wt 260.0 lb

## 2020-07-03 DIAGNOSIS — K7460 Unspecified cirrhosis of liver: Secondary | ICD-10-CM

## 2020-07-03 DIAGNOSIS — N179 Acute kidney failure, unspecified: Secondary | ICD-10-CM | POA: Diagnosis not present

## 2020-07-03 DIAGNOSIS — R569 Unspecified convulsions: Secondary | ICD-10-CM

## 2020-07-03 DIAGNOSIS — I10 Essential (primary) hypertension: Secondary | ICD-10-CM | POA: Diagnosis not present

## 2020-07-03 DIAGNOSIS — E876 Hypokalemia: Secondary | ICD-10-CM

## 2020-07-03 MED FILL — MIRTAZAPINE 30 MG TABLET: 30 | 30 days supply | Qty: 30 | Fill #0

## 2020-07-03 NOTE — Patient Instructions (Addendum)
Jeffrey [name],  It was great to meet you today!  I am glad you are doing well after your hospital admission.  The sutures look great.  They may fall out by themselves soon.  With regards to your blood pressure, please hold the chlorthalidone tonight as this medication was supposed to be held after hospital discharge due to kidney injury.  I am rechecking your kidney function today I will call you with the results and what to do with the chlorthalidone.  Please contact your neurologist urgently about your seizure medications as it is unclear what patient's you should be taking.  I will refer you to the GI doctors for your liver cirrhosis.  It is great that you have not drank alcohol since hospital admission.  Please keep up the great work.  I have arranged an appointment for you to see Dr. Ginette Pitman on 12 October to follow-up your blood pressure and clarify her seizure medications.  Please follow-up sooner if needed to  Best wishes, Dr. Posey Pronto

## 2020-07-03 NOTE — Progress Notes (Signed)
    SUBJECTIVE:   CHIEF COMPLAINT / HPI:   Jeffrey [name] is a 54 yr old male who presents today for a hospital follow-up  Heavy Chronic Alcohol Abuse Patient was admitted to hospital last week for a head laceration and found to have electrolyte derangements secondary to EtOH substance use disorder. He is known to drink 48 ounces of beer per day. EtOH level was 166.  His MELD score was 20, with 20% mortality in the next 3 months.  Denies drinking EtOH since he left the hospital.  Hospital discharge summary patient needs referral to GI.  HTN  Patient had low to normotensive blood pressures in the hospital, due to AKI his Clorthalidone was held. He was recommended to hold this on discharge however he has been taking it since discharge as he was unaware he had to stop taking it.  He takes amlodipine 5mg  once daily and chlorthalidone 25mg . once daily.  Denies dizziness, chest pain, SOB, edema or palpitations.  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  Since then he has been having recurrent body tremors spells and is followed by neurology, Dr. [name].  He last saw Dr. [name] on June 10th.  The plan at that visit was for him to wean off Depakote and onto lamotrigine.  Has been taking Depakote ER 500 mg twice daily and also was prescribed a course of lamotrigine 100 mg twice daily at that visit.  He ran out of lamotrigine a few months ago and contact the clinic for refill however was unable to get through to the provider. Denies seizures since January 2019.   PERTINENT  PMH / PSH: Alcoholic liver cirrhosis, hypertension, seizures  OBJECTIVE:   BP 124/78   Pulse 96   Wt 260 lb (117.9 kg)   SpO2 95%   BMI 36.78 kg/m    General: Alert, cooperative,  no acute distress HEENT: Laceration below left eyebrow healing well, dissolvable sutures in place, no erythema, bleeding Cardio:  Normal S1 and S2, RRR. No murmurs or rubs.   Pulm: CTAB, normal work of breathing Abdomen: Bowel sounds normal. Abdomen soft and non-tender.  Extremities: No peripheral edema. Neuro: Cranial nerves grossly intact  ASSESSMENT/PLAN:   Hepatic cirrhosis Referred to GI following recent admission for [employer] intoxication. MELD score 20.   Essential hypertension BP at goal. He has been taking chlorthalidone. Recommended he stops chlorthalidone until his BMP shows improving creatinine. Continue Amlodipine 5mg . Follow up with Dr Berna Spare on 12th October.   Seizure ([place]) Pt is currently taking Depakote ER 500 mg twice daily which is supposed to be weaned off according to previous neuro note.  He completed the course of Lamotrigine 100mg  BID which his neurologist gave him but is unsure of the plan for these medications. I recommend he continues to take the Depakote and follows up with neuro urgently as an outpatient.      Lattie Haw, MD PGY-2 [place]

## 2020-07-04 ENCOUNTER — Telehealth: Payer: Self-pay | Admitting: Family Medicine

## 2020-07-04 ENCOUNTER — Other Ambulatory Visit: Payer: Self-pay | Admitting: Family Medicine

## 2020-07-04 LAB — BASIC METABOLIC PANEL
BUN/Creatinine Ratio: 16 (ref 9–20)
BUN: 26 mg/dL — ABNORMAL HIGH (ref 6–24)
CO2: 34 mmol/L — ABNORMAL HIGH (ref 20–29)
Calcium: 9.6 mg/dL (ref 8.7–10.2)
Chloride: 83 mmol/L — ABNORMAL LOW (ref 96–106)
Creatinine, Ser: 1.62 mg/dL — ABNORMAL HIGH (ref 0.76–1.27)
GFR calc Af Amer: 54 mL/min/{1.73_m2} — ABNORMAL LOW (ref >59)
GFR calc non Af Amer: 47 mL/min/{1.73_m2} — ABNORMAL LOW (ref >59)
Glucose: 111 mg/dL — ABNORMAL HIGH (ref 65–99)
Potassium: 3.1 mmol/L — ABNORMAL LOW (ref 3.5–5.2)
Sodium: 137 mmol/L (ref 134–144)

## 2020-07-04 MED ORDER — FOLIC ACID 1 MG PO TABS
1.0000 mg | ORAL_TABLET | Freq: Every day | ORAL | 3 refills | Status: DC
Start: 2020-07-04 — End: 2021-10-02

## 2020-07-04 MED ORDER — POTASSIUM CHLORIDE ER 20 MEQ PO TBCR: 20.0000 meq | EXTENDED_RELEASE_TABLET | Freq: Two times a day (BID) | ORAL | 0 refills | Status: DC

## 2020-07-04 NOTE — Telephone Encounter (Signed)
Called patient to inform him his K 3.1 however went to VM. Left VM and sent Kdur 2meq BID for 2 days to his pharmacy.

## 2020-07-04 NOTE — Addendum Note (Signed)
Addended by: Concepcion Living on: 07/04/2020 07:39 PM   Modules accepted: Orders

## 2020-07-04 NOTE — Telephone Encounter (Signed)
Folic acid sent to patients pharmacy.

## 2020-07-06 ENCOUNTER — Telehealth: Payer: Self-pay | Admitting: Family Medicine

## 2020-07-06 NOTE — Assessment & Plan Note (Signed)
Referred to GI following recent admission for [place] intoxication. MELD score 20.

## 2020-07-06 NOTE — Telephone Encounter (Signed)
-----   Message from Lattie Haw, MD sent at 07/04/2020  2:06 PM EDT ----- Regarding: low potassium Hi team,  Saw this patient yesterday. He potassium is a little low at 3.1. I called him but went to VM so I left a VM explaining this. Please could you contact him and tell him I have prescribed a 2 day course of oral potassium? I will also try again later  Thanks, appreciate it  [place]

## 2020-07-06 NOTE — Assessment & Plan Note (Addendum)
BP at goal. He has been taking chlorthalidone. Recommended he stops chlorthalidone until his BMP shows improving creatinine. Continue Amlodipine 5mg . Follow up with Dr Berna Spare on 12th October.

## 2020-07-06 NOTE — Telephone Encounter (Signed)
Tried to contact patient on cell and home numbers provided on epic to explain his low K results. No answer and unable to leave VM.

## 2020-07-07 ENCOUNTER — Telehealth: Payer: Self-pay | Admitting: Family Medicine

## 2020-07-07 NOTE — Telephone Encounter (Signed)
Medical Certification Employee's Work Limitations form dropped off for at front desk for completion.  Verified that patient section of form has been completed.  Last DOS/WCC with PCP was 06/06/20.  Placed form in team folder to be completed by clinical staff.  Jeffrey [name]

## 2020-07-07 NOTE — Assessment & Plan Note (Signed)
Pt is currently taking Depakote ER 500 mg twice daily which is supposed to be weaned off according to previous neuro note.  He completed the course of Lamotrigine 100mg  BID which his neurologist gave him but is unsure of the plan for these medications. I recommend he continues to take the Depakote and follows up with neuro urgently as an outpatient.

## 2020-07-07 NOTE — Assessment & Plan Note (Signed)
K 3.1 today. Sent KCL 20 meq BID for 2 days to his pharmacy. I have called the pai

## 2020-07-11 MED FILL — DIVALPROEX SOD ER 500 MG TA: 500 | 30 days supply | Qty: 60 | Fill #0

## 2020-07-11 NOTE — Telephone Encounter (Signed)
Called patient to discuss completion of the form.  He did not answer so I left a voicemail.  I will attempt to call the patient later today or tomorrow.

## 2020-07-11 NOTE — Telephone Encounter (Signed)
Clinical info completed on Work form.  Place form in Dr. Hulen Luster box for completion.  Raygen Dahm Zimmerman Rumple, CMA

## 2020-07-13 NOTE — Telephone Encounter (Signed)
Patient calls nurse line asking about form. Patient reports he needs this completed by tomorrow. Advised patient PCP tried to contact him earlier this week for more information. Patient states he will have his phone on him today to answer PCPs questions. Please advise.   [phone]

## 2020-07-18 ENCOUNTER — Ambulatory Visit: Payer: 59 | Admitting: Family Medicine

## 2020-07-22 ENCOUNTER — Encounter (HOSPITAL_COMMUNITY): Payer: Self-pay | Admitting: Emergency Medicine

## 2020-07-22 ENCOUNTER — Other Ambulatory Visit: Payer: Self-pay

## 2020-07-22 ENCOUNTER — Inpatient Hospital Stay (HOSPITAL_COMMUNITY)
Admission: EM | Admit: 2020-07-22 | Discharge: 2020-07-25 | DRG: 637 | Disposition: A | Discharge status: Home / Self Care | Payer: 59 | Attending: Internal Medicine | Admitting: Internal Medicine

## 2020-07-22 DIAGNOSIS — G931 Anoxic brain damage, not elsewhere classified: Secondary | ICD-10-CM | POA: Diagnosis not present

## 2020-07-22 DIAGNOSIS — E860 Dehydration: Secondary | ICD-10-CM | POA: Diagnosis present

## 2020-07-22 DIAGNOSIS — F339 Major depressive disorder, recurrent, unspecified: Secondary | ICD-10-CM | POA: Diagnosis present

## 2020-07-22 DIAGNOSIS — E1122 Type 2 diabetes mellitus with diabetic chronic kidney disease: Secondary | ICD-10-CM | POA: Diagnosis present

## 2020-07-22 DIAGNOSIS — E1140 Type 2 diabetes mellitus with diabetic neuropathy, unspecified: Secondary | ICD-10-CM | POA: Diagnosis present

## 2020-07-22 DIAGNOSIS — E1169 Type 2 diabetes mellitus with other specified complication: Secondary | ICD-10-CM | POA: Diagnosis present

## 2020-07-22 DIAGNOSIS — G4733 Obstructive sleep apnea (adult) (pediatric): Secondary | ICD-10-CM | POA: Diagnosis present

## 2020-07-22 DIAGNOSIS — F331 Major depressive disorder, recurrent, moderate: Secondary | ICD-10-CM

## 2020-07-22 DIAGNOSIS — I872 Venous insufficiency (chronic) (peripheral): Secondary | ICD-10-CM | POA: Diagnosis present

## 2020-07-22 DIAGNOSIS — K7030 Alcoholic cirrhosis of liver without ascites: Secondary | ICD-10-CM | POA: Diagnosis not present

## 2020-07-22 DIAGNOSIS — E876 Hypokalemia: Secondary | ICD-10-CM | POA: Diagnosis present

## 2020-07-22 DIAGNOSIS — Z560 Unemployment, unspecified: Secondary | ICD-10-CM

## 2020-07-22 DIAGNOSIS — B370 Candidal stomatitis: Secondary | ICD-10-CM | POA: Diagnosis present

## 2020-07-22 DIAGNOSIS — Z6834 Body mass index (BMI) 34.0-34.9, adult: Secondary | ICD-10-CM

## 2020-07-22 DIAGNOSIS — Z888 Allergy status to other drugs, medicaments and biological substances status: Secondary | ICD-10-CM

## 2020-07-22 DIAGNOSIS — F419 Anxiety disorder, unspecified: Secondary | ICD-10-CM | POA: Diagnosis present

## 2020-07-22 DIAGNOSIS — Z20822 Contact with and (suspected) exposure to covid-19: Secondary | ICD-10-CM | POA: Diagnosis present

## 2020-07-22 DIAGNOSIS — Z833 Family history of diabetes mellitus: Secondary | ICD-10-CM | POA: Diagnosis not present

## 2020-07-22 DIAGNOSIS — Z91128 Patient's intentional underdosing of medication regimen for other reason: Secondary | ICD-10-CM

## 2020-07-22 DIAGNOSIS — G9341 Metabolic encephalopathy: Secondary | ICD-10-CM | POA: Diagnosis present

## 2020-07-22 DIAGNOSIS — T383X6A Underdosing of insulin and oral hypoglycemic [antidiabetic] drugs, initial encounter: Secondary | ICD-10-CM | POA: Diagnosis present

## 2020-07-22 DIAGNOSIS — E669 Obesity, unspecified: Secondary | ICD-10-CM | POA: Diagnosis present

## 2020-07-22 DIAGNOSIS — D472 Monoclonal gammopathy: Secondary | ICD-10-CM | POA: Diagnosis present

## 2020-07-22 DIAGNOSIS — Z7982 Long term (current) use of aspirin: Secondary | ICD-10-CM

## 2020-07-22 DIAGNOSIS — Z7984 Long term (current) use of oral hypoglycemic drugs: Secondary | ICD-10-CM

## 2020-07-22 DIAGNOSIS — N179 Acute kidney failure, unspecified: Secondary | ICD-10-CM

## 2020-07-22 DIAGNOSIS — E1165 Type 2 diabetes mellitus with hyperglycemia: Secondary | ICD-10-CM

## 2020-07-22 DIAGNOSIS — Z23 Encounter for immunization: Secondary | ICD-10-CM

## 2020-07-22 DIAGNOSIS — B379 Candidiasis, unspecified: Secondary | ICD-10-CM

## 2020-07-22 DIAGNOSIS — Z79899 Other long term (current) drug therapy: Secondary | ICD-10-CM

## 2020-07-22 DIAGNOSIS — E785 Hyperlipidemia, unspecified: Secondary | ICD-10-CM | POA: Diagnosis present

## 2020-07-22 DIAGNOSIS — Z720 Tobacco use: Secondary | ICD-10-CM

## 2020-07-22 DIAGNOSIS — Z8674 Personal history of sudden cardiac arrest: Secondary | ICD-10-CM

## 2020-07-22 DIAGNOSIS — E1110 Type 2 diabetes mellitus with ketoacidosis without coma: Secondary | ICD-10-CM | POA: Diagnosis present

## 2020-07-22 DIAGNOSIS — K219 Gastro-esophageal reflux disease without esophagitis: Secondary | ICD-10-CM | POA: Diagnosis present

## 2020-07-22 DIAGNOSIS — I10 Essential (primary) hypertension: Secondary | ICD-10-CM | POA: Diagnosis not present

## 2020-07-22 DIAGNOSIS — Z794 Long term (current) use of insulin: Secondary | ICD-10-CM

## 2020-07-22 DIAGNOSIS — K7460 Unspecified cirrhosis of liver: Secondary | ICD-10-CM | POA: Diagnosis present

## 2020-07-22 DIAGNOSIS — N1832 Chronic kidney disease, stage 3b: Secondary | ICD-10-CM | POA: Diagnosis present

## 2020-07-22 DIAGNOSIS — E0810 Diabetes mellitus due to underlying condition with ketoacidosis without coma: Secondary | ICD-10-CM

## 2020-07-22 LAB — BASIC METABOLIC PANEL
Anion gap: 49 — ABNORMAL HIGH (ref 5–15)
BUN: 46 mg/dL — ABNORMAL HIGH (ref 6–20)
CO2: 10 mmol/L — ABNORMAL LOW (ref 22–32)
Calcium: 8.5 mg/dL — ABNORMAL LOW (ref 8.9–10.3)
Chloride: 66 mmol/L — ABNORMAL LOW (ref 98–111)
Creatinine, Ser: 2.94 mg/dL — ABNORMAL HIGH (ref 0.61–1.24)
GFR, Estimated: 23 mL/min — ABNORMAL LOW (ref >60)
Glucose, Bld: 801 mg/dL (ref 70–99)
Potassium: 5.5 mmol/L — ABNORMAL HIGH (ref 3.5–5.1)
Sodium: 125 mmol/L — ABNORMAL LOW (ref 135–145)

## 2020-07-22 LAB — URINALYSIS, ROUTINE W REFLEX MICROSCOPIC
Bacteria, UA: NONE SEEN
Bilirubin Urine: NEGATIVE
Glucose, UA: >=500 mg/dL — AB
Ketones, ur: 80 mg/dL — AB
Leukocytes,Ua: NEGATIVE
Nitrite: NEGATIVE
Protein, ur: 30 mg/dL — AB
Specific Gravity, Urine: 1.02 (ref 1.005–1.030)
pH: 5 (ref 5.0–8.0)

## 2020-07-22 LAB — CBC
HCT: 39.6 % (ref 39.0–52.0)
Hemoglobin: 12 g/dL — ABNORMAL LOW (ref 13.0–17.0)
MCH: 28.5 pg (ref 26.0–34.0)
MCHC: 30.3 g/dL (ref 30.0–36.0)
MCV: 94.1 fL (ref 80.0–100.0)
Platelets: 348 10*3/uL (ref 150–400)
RBC: 4.21 MIL/uL — ABNORMAL LOW (ref 4.22–5.81)
RDW: 19.6 % — ABNORMAL HIGH (ref 11.5–15.5)
WBC: 10.7 10*3/uL — ABNORMAL HIGH (ref 4.0–10.5)
nRBC: 0 % (ref 0.0–0.2)

## 2020-07-22 LAB — CBG MONITORING, ED
Glucose-Capillary: 557 mg/dL (ref 70–99)
Glucose-Capillary: >600 mg/dL (ref 70–99)
Glucose-Capillary: >600 mg/dL (ref 70–99)
Glucose-Capillary: 534 mg/dL (ref 70–99)
Glucose-Capillary: >600 mg/dL (ref 70–99)
Glucose-Capillary: 427 mg/dL — ABNORMAL HIGH (ref 70–99)
Glucose-Capillary: 477 mg/dL — ABNORMAL HIGH (ref 70–99)
Glucose-Capillary: 369 mg/dL — ABNORMAL HIGH (ref 70–99)
Glucose-Capillary: 239 mg/dL — ABNORMAL HIGH (ref 70–99)
Glucose-Capillary: 202 mg/dL — ABNORMAL HIGH (ref 70–99)
Glucose-Capillary: 197 mg/dL — ABNORMAL HIGH (ref 70–99)

## 2020-07-22 LAB — RESPIRATORY PANEL BY RT PCR (FLU A&B, COVID)
Influenza A by PCR: NEGATIVE
Influenza B by PCR: NEGATIVE
SARS Coronavirus 2 by RT PCR: NEGATIVE

## 2020-07-22 LAB — HEPATIC FUNCTION PANEL
ALT: 19 U/L (ref 0–44)
AST: 27 U/L (ref 15–41)
Albumin: 3.3 g/dL — ABNORMAL LOW (ref 3.5–5.0)
Alkaline Phosphatase: 128 U/L — ABNORMAL HIGH (ref 38–126)
Bilirubin, Direct: 0.1 mg/dL (ref 0.0–0.2)
Indirect Bilirubin: 3.1 mg/dL — ABNORMAL HIGH (ref 0.3–0.9)
Total Bilirubin: 3.2 mg/dL — ABNORMAL HIGH (ref 0.3–1.2)
Total Protein: 8 g/dL (ref 6.5–8.1)

## 2020-07-22 LAB — AMMONIA: Ammonia: 26 umol/L (ref 9–35)

## 2020-07-22 LAB — PROTIME-INR
INR: 1.5 — ABNORMAL HIGH (ref 0.8–1.2)
Prothrombin Time: 17.9 seconds — ABNORMAL HIGH (ref 11.4–15.2)

## 2020-07-22 MED ORDER — LACTATED RINGERS IV SOLN: INTRAVENOUS | Status: DC

## 2020-07-22 MED ORDER — DEXTROSE 50 % IV SOLN: 0.0000 mL | INTRAVENOUS | Status: DC | PRN

## 2020-07-22 MED ORDER — SODIUM CHLORIDE 0.9 % IV BOLUS
1000.0000 mL | Freq: Once | INTRAVENOUS | Status: AC
  Administered 2020-07-22: 1000 mL via INTRAVENOUS

## 2020-07-22 MED ORDER — NYSTATIN 100000 UNIT/ML MT SUSP
5.0000 mL | Freq: Four times a day (QID) | OROMUCOSAL | Status: DC
  Administered 2020-07-23 – 2020-07-25 (×9): 500000 [IU] via ORAL
  Filled 2020-07-22 (×11): qty 5

## 2020-07-22 MED ORDER — DEXTROSE IN LACTATED RINGERS 5 % IV SOLN: INTRAVENOUS | Status: DC

## 2020-07-22 MED ORDER — INSULIN REGULAR(HUMAN) IN NACL 100-0.9 UT/100ML-% IV SOLN
INTRAVENOUS | Status: DC
  Administered 2020-07-22: 8 [IU]/h via INTRAVENOUS
  Administered 2020-07-23: 5 [IU]/h via INTRAVENOUS
  Filled 2020-07-22 (×2): qty 100

## 2020-07-22 NOTE — ED Provider Notes (Signed)
[facility] DEPT Provider Note   CSN: [id number] Arrival date & time: 07/22/20  1214     History Chief Complaint  Patient presents with  . Hyperglycemia    Jeffrey [name] is a 54 y.o. male.  Patient with hx iddm, c/o general weakness, decreased po intake, and high blood sugars in past week. Symptoms acute onset, moderate-severe, constant, persistent. Has not taken insulin in a week - states feels depressed due to loss of job. Denies SI/HI. Denies fever or chills. No headache. No chest pain. No sob. No uri symptoms. No abd pain or nvd. Denies polyuria or polydipsia.   The history is provided by the patient and the EMS personnel.  Hyperglycemia Associated symptoms: no abdominal pain, no chest pain, no confusion, no dysuria, no fever, no polyuria, no shortness of breath and no vomiting        Past Medical History:  Diagnosis Date  . Acute renal insufficiency 12/08/2012  . AKI (acute kidney injury) ([place])   . ALCOHOL ABUSE, HX OF 11/10/2007  . Anoxic encephalopathy ([place])   . Anxiety   . Bleeding ulcer 2014  . Blood transfusion 2014   "related to bleeding ulcer"  . Cardiac arrest ([place]) 12/07/2012   Anoxic encephalopathy  . GERD (gastroesophageal reflux disease)   . High cholesterol   . Hypertension   . OSA on CPAP 12/07/2012  . Seizure-like activity ([place])   . Type II diabetes mellitus ([place])   . Venous insufficiency 12/13/2011    Patient Active Problem List   Diagnosis Date Noted  . Hyponatremia 06/28/2020  . Eyebrow laceration, left, initial encounter   . Hypokalemia   . Hyperlipidemia associated with type 2 diabetes mellitus ([place])   . MGUS (monoclonal gammopathy of unknown significance) 05/31/2020  . Seizures ([place]) 03/16/2020  . Type 2 diabetes mellitus with hyperglycemia, with long-term current use of insulin ([place]) 03/16/2020  . Tremor 03/16/2020  . Colon cancer screening 02/22/2020  . Stage 3a chronic kidney disease ([place]) 01/29/2020  .  Hyperphosphatemia 01/29/2020  . Need for immunization against influenza 06/04/2019  . Major depressive disorder, recurrent episode ([place]) 01/13/2018  . Seizure ([place]) 04/08/2017  . Proteinuria 11/23/2015  . Essential hypertension   . Hepatic cirrhosis ([place]) 03/22/2015  . Ataxia 03/20/2015  . Dysarthria 03/09/2015  . ETOH abuse   . Hyperkalemia   . Tachycardia   . Erosive esophagitis 10/10/2014  . Acute kidney injury ([place])   . OSA on CPAP 12/28/2012  . Cardiac arrest ([place]) 12/07/2012  . Anoxic brain injury ([place]) 12/07/2012  . ERECTILE DYSFUNCTION 04/14/2007  . Type 2 diabetes mellitus with diabetic neuropathy, with long-term current use of insulin ([place]) 12/04/2006  . HYPERCHOLESTEROLEMIA 12/04/2006    Past Surgical History:  Procedure Laterality Date  . CARDIOVASCULAR STRESS TEST  03/16/13   Very Poor Exercise Tolerance; NON DIAGNOSTIC TEST  . CATARACT EXTRACTION W/ INTRAOCULAR LENS IMPLANT Left 03/07/2014   [place] @ [facility]  . ESOPHAGOGASTRODUODENOSCOPY Left 11/26/2012   Procedure: ESOPHAGOGASTRODUODENOSCOPY (EGD);  Surgeon: Wonda Horner, MD;  Location[facility] ENDOSCOPY;  Service: Endoscopy;  Laterality: Left;  . ESOPHAGOGASTRODUODENOSCOPY N/A 10/10/2014   Procedure: ESOPHAGOGASTRODUODENOSCOPY (EGD);  Surgeon: Ladene Artist, MD;  Location: [facility] ENDOSCOPY;  Service: Endoscopy;  Laterality: N/A;  . KNEE ARTHROSCOPY Left ~ 1999       Family History  Problem Relation Age of Onset  . Other Mother        Unsure of medical history.  . Diabetes  Mellitus II Father     Social History   Tobacco Use  . Smoking status: Never Smoker  . Smokeless tobacco: Current User    Types: Snuff  Substance Use Topics  . Alcohol use: Yes    Alcohol/week: 6.0 standard drinks    Types: 6 Cans of beer per week  . Drug use: No    Home Medications Prior to Admission medications   Medication Sig Start Date End Date Taking? Authorizing Provider  amLODipine (NORVASC) 5 MG tablet Take 1  tablet (5 mg total) by mouth daily. 06/24/19   Zenia Resides, MD  aspirin EC 81 MG tablet Take 1 tablet (81 mg total) by mouth at bedtime. 06/24/19   Zenia Resides, MD  atorvastatin (LIPITOR) 80 MG tablet Take 1 tablet (80 mg total) by mouth at bedtime. 06/24/19   Zenia Resides, MD  Continuous Blood Gluc Sensor (DEXCOM G6 SENSOR) MISC 1 Units by Does not apply route as directed. 05/23/20   [name], MD  divalproex (DEPAKOTE ER) 500 MG 24 hr tablet Take 2 tablets (1,000 mg total) by mouth at bedtime. Patient taking differently: Take 1,000 mg by mouth 2 (two) times daily.  03/16/20   Marcial Pacas, MD  FARXIGA 10 MG TABS tablet Take 10 mg by mouth daily. 05/09/20   [provider]  folic acid (FOLVITE) 1 MG tablet Take 1 tablet (1 mg total) by mouth daily. 07/04/20   [name], MD  gabapentin (NEURONTIN) 300 MG capsule Take 3 capsules (900 mg total) by mouth 3 (three) times daily. 06/29/20   Gladys Damme, MD  glucose blood (TRUE METRIX BLOOD GLUCOSE TEST) test strip Please use to check blood sugar up to three times daily.  07/03/20   [name], MD  insulin aspart (FIASP FLEXTOUCH) 100 UNIT/ML FlexTouch Pen Inject 8 Units into the skin with breakfast, with lunch, and with evening meal. 06/22/20   Leavy Cella, RPH-CPP  insulin glargine (LANTUS SOLOSTAR) 100 UNIT/ML Solostar Pen INJECT 20 UNITS INTO THE SKIN DAILY. Patient taking differently: Inject 20 Units into the skin daily. INJECT 20 UNITS INTO THE SKIN DAILY. 06/22/20   Leavy Cella, RPH-CPP  lidocaine-prilocaine (EMLA) cream 4 gram to hands/feet tid prn Patient taking differently: Apply 1 application topically See admin instructions. Apply 4 gram to hands/feet three times daily as needed. 03/16/20   Marcial Pacas, MD  metFORMIN (GLUCOPHAGE-XR) 500 MG 24 hr tablet Take 1 tablet (500 mg total) by mouth daily with breakfast. 06/29/20   Gladys Damme, MD  metoprolol succinate (TOPROL XL) 50 MG 24 hr tablet Take 1  tablet (50 mg total) by mouth daily. Take with or immediately following a meal. 06/29/20 07/29/20  Gladys Damme, MD  mirtazapine (REMERON) 30 MG tablet TAKE 1 TABLET (30 MG TOTAL) BY MOUTH AT BEDTIME. 06/30/20   [name], MD  Multiple Vitamin (MULTIVITAMIN WITH MINERALS) TABS tablet Take 1 tablet by mouth at bedtime.     [provider]  omeprazole (PRILOSEC) 20 MG capsule Take 1 capsule (20 mg total) by mouth daily. 06/06/20   [name], MD  potassium chloride 20 MEQ TBCR Take 20 mEq by mouth 2 (two) times daily for 2 days. 07/04/20 07/06/20  Lattie Haw, MD  Semaglutide,0.25 or 0.5MG /DOS, (OZEMPIC, 0.25 OR 0.5 MG/DOSE,) 2 MG/1.5ML SOPN Inject 0.5 mg into the skin once a week. Patient taking differently: Inject 0.5 mg into the skin once a week. Friday 06/22/20   Leavy Cella, RPH-CPP  sildenafil (  VIAGRA) 100 MG tablet TAKE 1 TABLET BY MOUTH ONCE DAILY AS NEEDED FOR ERECTILE DYSFUNCTION Patient not taking: Reported on 06/22/2020 05/29/20   [name], MD  thiamine (VITAMIN B-1) 100 MG tablet Take 1 tablet (100 mg total) by mouth daily. 05/27/16   [name], DO  torsemide (DEMADEX) 20 MG tablet Take 20 mg by mouth daily. 06/02/20   [provider]  metoprolol tartrate (LOPRESSOR) 25 MG tablet Take 1 tablet by mouth twice daily 04/26/20   [name], MD  sildenafil (VIAGRA) 100 MG tablet TAKE 1 TABLET BY MOUTH ONCE DAILY AS NEEDED FOR ERECTILE DYSFUNCTION 04/21/20   [name], MD    Allergies    Lisinopril and Drug ingredient [spironolactone]  Review of Systems   Review of Systems  Constitutional: Negative for chills and fever.  HENT: Negative for sore throat.   Eyes: Negative for redness.  Respiratory: Negative for cough and shortness of breath.   Cardiovascular: Negative for chest pain.  Gastrointestinal: Negative for abdominal pain and vomiting.  Endocrine: Negative for polyuria.  Genitourinary: Negative for dysuria and flank pain.   Musculoskeletal: Negative for back pain and neck pain.  Skin: Negative for rash.  Neurological: Negative for headaches.  Hematological: Does not bruise/bleed easily.  Psychiatric/Behavioral: Negative for confusion.    Physical Exam Updated Vital Signs BP 133/78 (BP Location: Right Arm)   Pulse (!) 101   Temp 98.8 F (37.1 C) (Oral)   Resp (!) 21   SpO2 100%   Physical Exam Vitals and nursing note reviewed.  Constitutional:      Appearance: Normal appearance. He is well-developed.  HENT:     Head: Atraumatic.     Nose: Nose normal.     Mouth/Throat:     Mouth: Mucous membranes are moist.     Pharynx: Oropharynx is clear.  Eyes:     General: No scleral icterus.    Conjunctiva/sclera: Conjunctivae normal.     Pupils: Pupils are equal, round, and reactive to light.  Neck:     Trachea: No tracheal deviation.  Cardiovascular:     Rate and Rhythm: Regular rhythm. Tachycardia present.     Pulses: Normal pulses.     Heart sounds: Normal heart sounds. No murmur heard.  No friction rub. No gallop.   Pulmonary:     Effort: Pulmonary effort is normal. No accessory muscle usage or respiratory distress.     Breath sounds: Normal breath sounds.  Abdominal:     General: Bowel sounds are normal. There is no distension.     Palpations: Abdomen is soft.     Tenderness: There is no abdominal tenderness. There is no guarding.  Genitourinary:    Comments: No cva tenderness. Musculoskeletal:        General: No swelling or tenderness.     Cervical back: Normal range of motion and neck supple. No rigidity.  Skin:    General: Skin is warm and dry.     Findings: No rash.  Neurological:     Mental Status: He is alert.     Comments: Alert, speech clear. Motor/sens grossly intact bil.   Psychiatric:        Mood and Affect: Mood normal.     ED Results / Procedures / Treatments   Labs (all labs ordered are listed, but only abnormal results are displayed) Results for orders placed or  performed during the hospital encounter of 07/22/20  Respiratory Panel by RT PCR (Flu A&B, Covid) - Nasopharyngeal Swab  Specimen: Nasopharyngeal Swab  Result Value Ref Range   SARS Coronavirus 2 by RT PCR NEGATIVE NEGATIVE   Influenza A by PCR NEGATIVE NEGATIVE   Influenza B by PCR NEGATIVE NEGATIVE  Basic metabolic panel  Result Value Ref Range   Sodium 125 (L) 135 - 145 mmol/L   Potassium 5.5 (H) 3.5 - 5.1 mmol/L   Chloride 66 (L) 98 - 111 mmol/L   CO2 10 (L) 22 - 32 mmol/L   Glucose, Bld 801 (HH) 70 - 99 mg/dL   BUN 46 (H) 6 - 20 mg/dL   Creatinine, Ser 2.94 (H) 0.61 - 1.24 mg/dL   Calcium 8.5 (L) 8.9 - 10.3 mg/dL   GFR, Estimated 23 (L) >60 mL/min   Anion gap 49 (H) 5 - 15  CBC  Result Value Ref Range   WBC 10.7 (H) 4.0 - 10.5 K/uL   RBC 4.21 (L) 4.22 - 5.81 MIL/uL   Hemoglobin 12.0 (L) 13.0 - 17.0 g/dL   HCT 39.6 39 - 52 %   MCV 94.1 80.0 - 100.0 fL   MCH 28.5 26.0 - 34.0 pg   MCHC 30.3 30.0 - 36.0 g/dL   RDW 19.6 (H) 11.5 - 15.5 %   Platelets 348 150 - 400 K/uL   nRBC 0.0 0.0 - 0.2 %  Urinalysis, Routine w reflex microscopic  Result Value Ref Range   Color, Urine STRAW (A) YELLOW   APPearance CLEAR CLEAR   Specific Gravity, Urine 1.020 1.005 - 1.030   pH 5.0 5.0 - 8.0   Glucose, UA >=500 (A) NEGATIVE mg/dL   Hgb urine dipstick LARGE (A) NEGATIVE   Bilirubin Urine NEGATIVE NEGATIVE   Ketones, ur 80 (A) NEGATIVE mg/dL   Protein, ur 30 (A) NEGATIVE mg/dL   Nitrite NEGATIVE NEGATIVE   Leukocytes,Ua NEGATIVE NEGATIVE   RBC / HPF 0-5 0 - 5 RBC/hpf   WBC, UA 0-5 0 - 5 WBC/hpf   Bacteria, UA NONE SEEN NONE SEEN   Squamous Epithelial / LPF 0-5 0 - 5   Mucus PRESENT   Ammonia  Result Value Ref Range   Ammonia 26 9 - 35 umol/L  Protime-INR  Result Value Ref Range   Prothrombin Time 17.9 (H) 11.4 - 15.2 seconds   INR 1.5 (H) 0.8 - 1.2  Hepatic function panel  Result Value Ref Range   Total Protein 8.0 6.5 - 8.1 g/dL   Albumin 3.3 (L) 3.5 - 5.0 g/dL   AST 27 15  - 41 U/L   ALT 19 0 - 44 U/L   Alkaline Phosphatase 128 (H) 38 - 126 U/L   Total Bilirubin 3.2 (H) 0.3 - 1.2 mg/dL   Bilirubin, Direct 0.1 0.0 - 0.2 mg/dL   Indirect Bilirubin 3.1 (H) 0.3 - 0.9 mg/dL  CBG monitoring, ED  Result Value Ref Range   Glucose-Capillary 557 (HH) 70 - 99 mg/dL   Comment 1 Notify RN   CBG monitoring, ED  Result Value Ref Range   Glucose-Capillary >600 (HH) 70 - 99 mg/dL  CBG monitoring, ED  Result Value Ref Range   Glucose-Capillary >600 (HH) 70 - 99 mg/dL  CBG monitoring, ED  Result Value Ref Range   Glucose-Capillary >600 (HH) 70 - 99 mg/dL  CBG monitoring, ED  Result Value Ref Range   Glucose-Capillary 534 (HH) 70 - 99 mg/dL   Comment 1 Notify RN   CBG monitoring, ED  Result Value Ref Range   Glucose-Capillary 477 (H) 70 - 99 mg/dL  CBG monitoring, ED  Result Value Ref Range   Glucose-Capillary 427 (H) 70 - 99 mg/dL  CBG monitoring, ED  Result Value Ref Range   Glucose-Capillary 369 (H) 70 - 99 mg/dL   CT Head Wo Contrast  Result Date: 06/28/2020 CLINICAL DATA:  Headache forehead laceration EXAM: CT HEAD WITHOUT CONTRAST TECHNIQUE: Contiguous axial images were obtained from the base of the skull through the vertex without intravenous contrast. COMPARISON:  04/03/2020 FINDINGS: Brain: No evidence of acute infarction, hemorrhage, hydrocephalus, extra-axial collection or mass lesion/mass effect. Mild cerebral volume loss, unchanged. Vascular: No hyperdense vessel or unexpected calcification. Skull: Normal. Negative for fracture or focal lesion. Sinuses/Orbits: No acute finding. Other: Left supraorbital soft tissue swelling. IMPRESSION: 1. No acute intracranial findings. 2. Left supraorbital soft tissue swelling. Electronically Signed   By: Davina Poke D.O.   On: 06/28/2020 10:54   US Abdomen Limited RUQ  Result Date: 06/28/2020 CLINICAL DATA:  Cirrhosis. EXAM: ULTRASOUND ABDOMEN LIMITED RIGHT UPPER QUADRANT COMPARISON:  Right upper quadrant  ultrasound dated March 09, 2015. FINDINGS: Gallbladder: Contracted with mild wall thickening visualized. No gallstones. No sonographic Murphy sign noted by sonographer. Common bile duct: Diameter: 3 mm, normal. Liver: No focal lesion identified. Coarsened heterogeneously increased parenchymal echogenicity. Portal vein is patent on color Doppler imaging with normal direction of blood flow towards the liver. Other: None. IMPRESSION: 1. No acute abnormality. 2. Coarsened heterogeneously increased hepatic parenchymal echogenicity consistent with provided history of cirrhosis. 3. Contracted gallbladder with mild wall thickening, likely related to underdistention. Electronically Signed   By: Titus Dubin M.D.   On: 06/28/2020 17:59    EKG None  Radiology No results found.  Procedures Procedures (including critical care time)  Medications Ordered in ED Medications - No data to display  ED Course  I have reviewed the triage vital signs and the nursing notes.  Pertinent labs & imaging results that were available during my care of the patient were reviewed by me and considered in my medical decision making (see chart for details).    MDM Rules/Calculators/A&P                          Iv ns bolus. Stat labs. Continuous pulse ox and monitor.   MDM Number of Diagnoses or Management Options AKI (acute kidney injury) ([place]): new, needed workup Diabetic ketoacidosis without coma associated with diabetes mellitus due to underlying condition [facility]): new, needed workup Severe hyperglycemia due to diabetes mellitus ([place]): new, needed workup   Amount and/or Complexity of Data Reviewed Clinical lab tests: ordered and reviewed Tests in the medicine section of CPT: ordered and reviewed Discussion of test results with the performing providers: yes Decide to obtain previous medical records or to obtain history from someone other than the patient: yes Obtain history from someone other than the patient:  yes Review and summarize past medical records: yes Discuss the patient with other providers: yes  Risk of Complications, Morbidity, and/or Mortality Presenting problems: high Diagnostic procedures: high Management options: high   Reviewed nursing notes and prior charts for additional history. Prior admission reviewed.   Additional ns iv. Await labs.   Labs reviewed/interpreted by me - glucose v high, 801. hco3 low. AG high.   Insulin gtt via hyperglycemic crisis orders.   Insulin gtt titrated per orderset.   Recheck pt, no new c/o, no pain, no fever. abd soft nt. Afebrile.   hospitalists consulted for admission. Discussed pt, labs, etc.  -  will admit to stepdown. Also discussed need for behavioral health eval in next 1-2 day when medically stable (re: depression).   CRITICAL CARE RE:  Diabetic ketoacidosis, severe hyperglycemia, dehydration, AKI. Performed by: Mirna Mires Total critical care time: 85 minutes Critical care time was exclusive of separately billable procedures and treating other patients. Critical care was necessary to treat or prevent imminent or life-threatening deterioration. Critical care was time spent personally by me on the following activities: development of treatment plan with patient and/or surrogate as well as nursing, discussions with consultants, evaluation of patient's response to treatment, examination of patient, obtaining history from patient or surrogate, ordering and performing treatments and interventions, ordering and review of laboratory studies, ordering and review of radiographic studies, pulse oximetry and re-evaluation of patient's condition.    Final Clinical Impression(s) / ED Diagnoses Final diagnoses:  None    Rx / DC Orders ED Discharge Orders    None       Lajean Saver, MD 07/22/20 2013

## 2020-07-22 NOTE — ED Triage Notes (Signed)
Per EMS-has not been taking insulin/meds for over a week-CBG reading high-oriented X1-GF states he has just been lying in bed possibly due to depression-recent job loss

## 2020-07-22 NOTE — H&P (Signed)
History and Physical        Hospital Admission Note Date: 07/22/2020  Patient name: Jeffrey [name] Medical record number: [id number] Date of birth: 1966/09/30 Age: 54 y.o. Gender: male  PCP: [name], MD  Patient coming from: Home Lives with: Wife   Chief Complaint    Chief Complaint  Patient presents with  . Hyperglycemia      HPI:   Patient is a very poor historian.  Much of history is obtained from the ED note  This is a 54 year old male with past medical history of insulin-dependent diabetes, alcohol use, cardiac arrest with anoxic brain injury, OSA on CPAP, CKD 3 a, cirrhosis, seizures who is a patient of the family medicine residency service who complains of generalized weakness, decreased p.o. intake and elevated blood sugars over the past week.  Per ED note patient symptoms are acute, moderate to severe, constant and persistent.  Apparently he has not taken his insulin in the past week and stated that he felt depressed due to losing his job but denied suicidal or homicidal ideation in the ED.  Denied fever or chills or headache or chest pain, shortness of breath or URI symptoms.  No abdominal pain or nausea, vomiting or diarrhea.  No polyuria or polydipsia.  ED Course: Afebrile, tachycardic, hypertensive tolerating room air.  Notable labs: Na 125, K5.5,Cl 66, CO2 10, glucose 801, BUN 46, creatinine 2.94 (previously 1.6 on 9/27), AG 49, alk phos 128, ammonia 26, indirect bilirubin 3.1, direct bilirubin 3.2, INR 1.5, UA with glucosuria and 80 ketones.  He was started on insulin drip and fluids per DKA protocol.  Vitals:   07/22/20 1645 07/22/20 1715  BP: (!) 167/92 (!) 161/92  Pulse: (!) 103 (!) 103  Resp: 16 16  Temp:    SpO2: 100% 93%     Review of Systems:  Review of Systems  All other systems reviewed and are negative.   Medical/Social/Family  History   Past Medical History: Past Medical History:  Diagnosis Date  . Acute renal insufficiency 12/08/2012  . AKI (acute kidney injury) ([place])   . ALCOHOL ABUSE, HX OF 11/10/2007  . Anoxic encephalopathy ([place])   . Anxiety   . Bleeding ulcer 2014  . Blood transfusion 2014   "related to bleeding ulcer"  . Cardiac arrest ([place]) 12/07/2012   Anoxic encephalopathy  . GERD (gastroesophageal reflux disease)   . High cholesterol   . Hypertension   . OSA on CPAP 12/07/2012  . Seizure-like activity ([place])   . Type II diabetes mellitus ([place])   . Venous insufficiency 12/13/2011    Past Surgical History:  Procedure Laterality Date  . CARDIOVASCULAR STRESS TEST  03/16/13   Very Poor Exercise Tolerance; NON DIAGNOSTIC TEST  . CATARACT EXTRACTION W/ INTRAOCULAR LENS IMPLANT Left 03/07/2014   [place] @ [facility]  . ESOPHAGOGASTRODUODENOSCOPY Left 11/26/2012   Procedure: ESOPHAGOGASTRODUODENOSCOPY (EGD);  Surgeon: Wonda Horner, MD;  Location: [facility] ENDOSCOPY;  Service: Endoscopy;  Laterality: Left;  . ESOPHAGOGASTRODUODENOSCOPY N/A 10/10/2014   Procedure: ESOPHAGOGASTRODUODENOSCOPY (EGD);  Surgeon: Ladene Artist, MD;  Location: [facility] ENDOSCOPY;  Service: Endoscopy;  Laterality: N/A;  . KNEE ARTHROSCOPY Left ~ 1999    Medications:  Prior to Admission medications   Medication Sig Start Date End Date Taking? Authorizing Provider  amLODipine (NORVASC) 5 MG tablet Take 1 tablet (5 mg total) by mouth daily. 06/24/19   Zenia Resides, MD  aspirin EC 81 MG tablet Take 1 tablet (81 mg total) by mouth at bedtime. 06/24/19   Zenia Resides, MD  atorvastatin (LIPITOR) 80 MG tablet Take 1 tablet (80 mg total) by mouth at bedtime. 06/24/19   Zenia Resides, MD  Continuous Blood Gluc Sensor (DEXCOM G6 SENSOR) MISC 1 Units by Does not apply route as directed. 05/23/20   [name], MD  divalproex (DEPAKOTE ER) 500 MG 24 hr tablet Take 2 tablets (1,000 mg total) by mouth at bedtime. Patient taking  differently: Take 1,000 mg by mouth 2 (two) times daily.  03/16/20   Marcial Pacas, MD  FARXIGA 10 MG TABS tablet Take 10 mg by mouth daily. 05/09/20   [provider]  folic acid (FOLVITE) 1 MG tablet Take 1 tablet (1 mg total) by mouth daily. 07/04/20   [name], MD  gabapentin (NEURONTIN) 300 MG capsule Take 3 capsules (900 mg total) by mouth 3 (three) times daily. 06/29/20   Gladys Damme, MD  glucose blood (TRUE METRIX BLOOD GLUCOSE TEST) test strip Please use to check blood sugar up to three times daily.  07/03/20   [name], MD  insulin aspart (FIASP FLEXTOUCH) 100 UNIT/ML FlexTouch Pen Inject 8 Units into the skin with breakfast, with lunch, and with evening meal. 06/22/20   Leavy Cella, RPH-CPP  insulin glargine (LANTUS SOLOSTAR) 100 UNIT/ML Solostar Pen INJECT 20 UNITS INTO THE SKIN DAILY. Patient taking differently: Inject 20 Units into the skin daily. INJECT 20 UNITS INTO THE SKIN DAILY. 06/22/20   Leavy Cella, RPH-CPP  lidocaine-prilocaine (EMLA) cream 4 gram to hands/feet tid prn Patient taking differently: Apply 1 application topically See admin instructions. Apply 4 gram to hands/feet three times daily as needed. 03/16/20   Marcial Pacas, MD  metFORMIN (GLUCOPHAGE-XR) 500 MG 24 hr tablet Take 1 tablet (500 mg total) by mouth daily with breakfast. 06/29/20   Gladys Damme, MD  metoprolol succinate (TOPROL XL) 50 MG 24 hr tablet Take 1 tablet (50 mg total) by mouth daily. Take with or immediately following a meal. 06/29/20 07/29/20  Gladys Damme, MD  mirtazapine (REMERON) 30 MG tablet TAKE 1 TABLET (30 MG TOTAL) BY MOUTH AT BEDTIME. 06/30/20   [name], MD  Multiple Vitamin (MULTIVITAMIN WITH MINERALS) TABS tablet Take 1 tablet by mouth at bedtime.     [provider]  omeprazole (PRILOSEC) 20 MG capsule Take 1 capsule (20 mg total) by mouth daily. 06/06/20   [name], MD  potassium chloride 20 MEQ TBCR Take 20 mEq by mouth 2 (two) times  daily for 2 days. 07/04/20 07/06/20  Lattie Haw, MD  Semaglutide,0.25 or 0.5MG/DOS, (OZEMPIC, 0.25 OR 0.5 MG/DOSE,) 2 MG/1.5ML SOPN Inject 0.5 mg into the skin once a week. Patient taking differently: Inject 0.5 mg into the skin once a week. Friday 06/22/20   Leavy Cella, RPH-CPP  sildenafil (VIAGRA) 100 MG tablet TAKE 1 TABLET BY MOUTH ONCE DAILY AS NEEDED FOR ERECTILE DYSFUNCTION Patient not taking: Reported on 06/22/2020 05/29/20   [name], MD  thiamine (VITAMIN B-1) 100 MG tablet Take 1 tablet (100 mg total) by mouth daily. 05/27/16   [name], DO  torsemide (DEMADEX) 20 MG tablet Take 20 mg by mouth daily. 06/02/20  [provider]  metoprolol tartrate (LOPRESSOR) 25 MG tablet Take 1 tablet by mouth twice daily 04/26/20   [name], MD  sildenafil (VIAGRA) 100 MG tablet TAKE 1 TABLET BY MOUTH ONCE DAILY AS NEEDED FOR ERECTILE DYSFUNCTION 04/21/20   [name], MD    Allergies:   Allergies  Allergen Reactions  . Lisinopril Other (See Comments)    Pt reports nose bleed.  . Drug Ingredient [Spironolactone]     Hyperkalemia    Social History:  reports that he has never smoked. His smokeless tobacco use includes snuff. He reports current alcohol use of about 6.0 standard drinks of alcohol per week. He reports that he does not use drugs.  Family History: Family History  Problem Relation Age of Onset  . Other Mother        Unsure of medical history.  . Diabetes Mellitus II Father      Objective   Physical Exam: Blood pressure (!) 161/92, pulse (!) 103, temperature 98.8 F (37.1 C), temperature source Oral, resp. rate 16, SpO2 93 %.  Physical Exam Vitals and nursing note reviewed. Exam conducted with a chaperone present.  Constitutional:      General: He is not in acute distress.    Comments: Slowed mentation   HENT:     Mouth/Throat:     Mouth: Mucous membranes are dry.     Comments: White plaque on tongue Poor dentition Eyes:      Conjunctiva/sclera: Conjunctivae normal.  Cardiovascular:     Rate and Rhythm: Regular rhythm. Tachycardia present.  Pulmonary:     Effort: Pulmonary effort is normal.     Breath sounds: Normal breath sounds.  Abdominal:     General: Abdomen is flat.     Palpations: Abdomen is soft.  Musculoskeletal:        General: No swelling or tenderness.  Neurological:     Mental Status: He is alert. He is disoriented.     Comments: Oriented x2 Repeatedly talking about a transponder and a receiver, unknown relevance  Psychiatric:        Speech: Speech is delayed.     LABS on Admission: I have personally reviewed all the labs and imaging below    Basic Metabolic Panel: Recent Labs  Lab 07/22/20 1240  NA 125*  K 5.5*  CL 66*  CO2 10*  GLUCOSE 801*  BUN 46*  CREATININE 2.94*  CALCIUM 8.5*   Liver Function Tests: Recent Labs  Lab 07/22/20 1307  AST 27  ALT 19  ALKPHOS 128*  BILITOT 3.2*  PROT 8.0  ALBUMIN 3.3*   No results for input(s): LIPASE, AMYLASE in the last 168 hours. Recent Labs  Lab 07/22/20 1307  AMMONIA 26   CBC: Recent Labs  Lab 07/22/20 1240  WBC 10.7*  HGB 12.0*  HCT 39.6  MCV 94.1  PLT 348   Cardiac Enzymes: No results for input(s): CKTOTAL, CKMB, CKMBINDEX, TROPONINI in the last 168 hours. BNP: Invalid input(s): POCBNP CBG: Recent Labs  Lab 07/22/20 1645 07/22/20 1727  GLUCAP >600* 534*    Radiological Exams on Admission:  No results found.    EKG: Not done in ED   A & P   Principal Problem:   DKA (diabetic ketoacidosis) ([place]) Active Problems:   Anoxic brain injury ([place])   Acute kidney injury ([place])   Hepatic cirrhosis ([place])   Essential hypertension   Major depressive disorder, recurrent episode ([place])   Hyperlipidemia associated with type 2 diabetes mellitus ([place])  Candidiasis   1. DKA, suspect secondary to medication noncompliance a. Continue IV insulin, IV fluids and n.p.o. per DKA protocol  2. AKI on CKD  3b a. IV fluids as above b. Hold nephrotoxic agents  3. Acute metabolic encephalopathy secondary to above with history of anoxic brain injury, unknown baseline a. Oriented x2 but not making much sense b. Ammonia negative c. Treat as above d. Attempted to call his wife but no response e. Hold sedating medications: Remeron and gabapentin  4. Elevated indirect bilirubin and alk phos with history of hepatic cirrhosis a. Had a recent unremarkable RUQ ultrasound b. Follow-up in a.m.  5. Oral candidiasis a. Oral nystatin  6. Depression a. Reported in the ED, apparently lost his job recently and has not been taking care of himself b. Consider psych eval once he is medically stable  7. Hypertension a. As needed pain hypertensive  8. Pseudohyponatremia   DVT prophylaxis: Heparin   Code Status: Prior  Diet: N.p.o. Family Communication: Admission, patients condition and plan of care including tests being ordered have been discussed with the patient who indicates understanding and agrees with the plan and Code Status.  Called patient's wife with no response Disposition Plan: The appropriate patient status for this patient is INPATIENT. Inpatient status is judged to be reasonable and necessary in order to provide the required intensity of service to ensure the patient's safety. The patient's presenting symptoms, physical exam findings, and initial radiographic and laboratory data in the context of their chronic comorbidities is felt to place them at high risk for further clinical deterioration. Furthermore, it is not anticipated that the patient will be medically stable for discharge from the hospital within 2 midnights of admission. The following factors support the patient status of inpatient.   " The patient's presenting symptoms include generalized weakness decreased p.o. intake and blood sugars elevated. " The worrisome physical exam findings include encephalopathy. " The initial  radiographic and laboratory data are worrisome because of DKA, AKI, elevated enzymes. " The chronic co-morbidities include anoxic brain injury, diabetes, hypertension.   * I certify that at the point of admission it is my clinical judgment that the patient will require inpatient hospital care spanning beyond 2 midnights from the point of admission due to high intensity of service, high risk for further deterioration and high frequency of surveillance required.*   Status is: Inpatient  Remains inpatient appropriate because:Altered mental status, IV treatments appropriate due to intensity of illness or inability to take PO and Inpatient level of care appropriate due to severity of illness   Dispo: The patient is from: Home              Anticipated d/c is to: Home              Anticipated d/c date is: 3 days              Patient currently is not medically stable to d/c.         The medical decision making on this patient was of high complexity and the patient is at high risk for clinical deterioration, therefore this is a level 3 admission.  Consultants  . None  Procedures  . None  Time Spent on Admission: 70 minutes    Harold Hedge, DO [facility]  07/22/2020, 5:42 PM

## 2020-07-23 LAB — BASIC METABOLIC PANEL
Anion gap: 22 — ABNORMAL HIGH (ref 5–15)
Anion gap: 26 — ABNORMAL HIGH (ref 5–15)
Anion gap: 16 — ABNORMAL HIGH (ref 5–15)
Anion gap: 18 — ABNORMAL HIGH (ref 5–15)
Anion gap: 17 — ABNORMAL HIGH (ref 5–15)
BUN: 32 mg/dL — ABNORMAL HIGH (ref 6–20)
BUN: 29 mg/dL — ABNORMAL HIGH (ref 6–20)
BUN: 25 mg/dL — ABNORMAL HIGH (ref 6–20)
BUN: 14 mg/dL (ref 6–20)
BUN: 20 mg/dL (ref 6–20)
CO2: 26 mmol/L (ref 22–32)
CO2: 22 mmol/L (ref 22–32)
CO2: 33 mmol/L — ABNORMAL HIGH (ref 22–32)
CO2: 22 mmol/L (ref 22–32)
CO2: 35 mmol/L — ABNORMAL HIGH (ref 22–32)
Calcium: 8.5 mg/dL — ABNORMAL LOW (ref 8.9–10.3)
Calcium: 8.5 mg/dL — ABNORMAL LOW (ref 8.9–10.3)
Calcium: 8.5 mg/dL — ABNORMAL LOW (ref 8.9–10.3)
Calcium: 7.3 mg/dL — ABNORMAL LOW (ref 8.9–10.3)
Calcium: 8.8 mg/dL — ABNORMAL LOW (ref 8.9–10.3)
Chloride: 85 mmol/L — ABNORMAL LOW (ref 98–111)
Chloride: 86 mmol/L — ABNORMAL LOW (ref 98–111)
Chloride: 86 mmol/L — ABNORMAL LOW (ref 98–111)
Chloride: 93 mmol/L — ABNORMAL LOW (ref 98–111)
Chloride: 84 mmol/L — ABNORMAL LOW (ref 98–111)
Creatinine, Ser: 1.83 mg/dL — ABNORMAL HIGH (ref 0.61–1.24)
Creatinine, Ser: 1.76 mg/dL — ABNORMAL HIGH (ref 0.61–1.24)
Creatinine, Ser: 1.33 mg/dL — ABNORMAL HIGH (ref 0.61–1.24)
Creatinine, Ser: 1.08 mg/dL (ref 0.61–1.24)
Creatinine, Ser: 1.41 mg/dL — ABNORMAL HIGH (ref 0.61–1.24)
GFR, Estimated: 40 mL/min — ABNORMAL LOW (ref >60)
GFR, Estimated: 42 mL/min — ABNORMAL LOW (ref >60)
GFR, Estimated: 59 mL/min — ABNORMAL LOW (ref >60)
GFR, Estimated: >60 mL/min (ref >60)
GFR, Estimated: 55 mL/min — ABNORMAL LOW (ref >60)
Glucose, Bld: 186 mg/dL — ABNORMAL HIGH (ref 70–99)
Glucose, Bld: 175 mg/dL — ABNORMAL HIGH (ref 70–99)
Glucose, Bld: 167 mg/dL — ABNORMAL HIGH (ref 70–99)
Glucose, Bld: 202 mg/dL — ABNORMAL HIGH (ref 70–99)
Potassium: 4.8 mmol/L (ref 3.5–5.1)
Potassium: 4.4 mmol/L (ref 3.5–5.1)
Potassium: 3.8 mmol/L (ref 3.5–5.1)
Potassium: 3.3 mmol/L — ABNORMAL LOW (ref 3.5–5.1)
Potassium: 2.8 mmol/L — ABNORMAL LOW (ref 3.5–5.1)
Sodium: 133 mmol/L — ABNORMAL LOW (ref 135–145)
Sodium: 134 mmol/L — ABNORMAL LOW (ref 135–145)
Sodium: 135 mmol/L (ref 135–145)
Sodium: 133 mmol/L — ABNORMAL LOW (ref 135–145)
Sodium: 136 mmol/L (ref 135–145)

## 2020-07-23 LAB — CBG MONITORING, ED
Glucose-Capillary: 143 mg/dL — ABNORMAL HIGH (ref 70–99)
Glucose-Capillary: 146 mg/dL — ABNORMAL HIGH (ref 70–99)
Glucose-Capillary: 161 mg/dL — ABNORMAL HIGH (ref 70–99)
Glucose-Capillary: 180 mg/dL — ABNORMAL HIGH (ref 70–99)
Glucose-Capillary: 190 mg/dL — ABNORMAL HIGH (ref 70–99)

## 2020-07-23 LAB — GLUCOSE, CAPILLARY
Glucose-Capillary: 149 mg/dL — ABNORMAL HIGH (ref 70–99)
Glucose-Capillary: 155 mg/dL — ABNORMAL HIGH (ref 70–99)
Glucose-Capillary: 157 mg/dL — ABNORMAL HIGH (ref 70–99)
Glucose-Capillary: 168 mg/dL — ABNORMAL HIGH (ref 70–99)
Glucose-Capillary: 169 mg/dL — ABNORMAL HIGH (ref 70–99)
Glucose-Capillary: 174 mg/dL — ABNORMAL HIGH (ref 70–99)
Glucose-Capillary: 176 mg/dL — ABNORMAL HIGH (ref 70–99)
Glucose-Capillary: 176 mg/dL — ABNORMAL HIGH (ref 70–99)
Glucose-Capillary: 189 mg/dL — ABNORMAL HIGH (ref 70–99)
Glucose-Capillary: 189 mg/dL — ABNORMAL HIGH (ref 70–99)
Glucose-Capillary: 195 mg/dL — ABNORMAL HIGH (ref 70–99)
Glucose-Capillary: 198 mg/dL — ABNORMAL HIGH (ref 70–99)
Glucose-Capillary: 203 mg/dL — ABNORMAL HIGH (ref 70–99)
Glucose-Capillary: 211 mg/dL — ABNORMAL HIGH (ref 70–99)
Glucose-Capillary: 221 mg/dL — ABNORMAL HIGH (ref 70–99)
Glucose-Capillary: 252 mg/dL — ABNORMAL HIGH (ref 70–99)

## 2020-07-23 LAB — COMPREHENSIVE METABOLIC PANEL
ALT: 16 U/L (ref 0–44)
AST: 29 U/L (ref 15–41)
Albumin: 3 g/dL — ABNORMAL LOW (ref 3.5–5.0)
Alkaline Phosphatase: 101 U/L (ref 38–126)
Anion gap: 24 — ABNORMAL HIGH (ref 5–15)
BUN: 32 mg/dL — ABNORMAL HIGH (ref 6–20)
CO2: 25 mmol/L (ref 22–32)
Calcium: 8.2 mg/dL — ABNORMAL LOW (ref 8.9–10.3)
Chloride: 87 mmol/L — ABNORMAL LOW (ref 98–111)
Creatinine, Ser: 1.86 mg/dL — ABNORMAL HIGH (ref 0.61–1.24)
GFR, Estimated: 40 mL/min — ABNORMAL LOW (ref >60)
Glucose, Bld: 173 mg/dL — ABNORMAL HIGH (ref 70–99)
Potassium: 3.6 mmol/L (ref 3.5–5.1)
Sodium: 136 mmol/L (ref 135–145)
Total Bilirubin: 2.1 mg/dL — ABNORMAL HIGH (ref 0.3–1.2)
Total Protein: 7 g/dL (ref 6.5–8.1)

## 2020-07-23 LAB — CBC
HCT: 38.6 % — ABNORMAL LOW (ref 39.0–52.0)
Hemoglobin: 12.8 g/dL — ABNORMAL LOW (ref 13.0–17.0)
MCH: 29 pg (ref 26.0–34.0)
MCHC: 33.2 g/dL (ref 30.0–36.0)
MCV: 87.3 fL (ref 80.0–100.0)
Platelets: 311 10*3/uL (ref 150–400)
RBC: 4.42 MIL/uL (ref 4.22–5.81)
RDW: 19.9 % — ABNORMAL HIGH (ref 11.5–15.5)
WBC: 9 10*3/uL (ref 4.0–10.5)
nRBC: 0 % (ref 0.0–0.2)

## 2020-07-23 LAB — BETA-HYDROXYBUTYRIC ACID
Beta-Hydroxybutyric Acid: 6.23 mmol/L — ABNORMAL HIGH (ref 0.05–0.27)
Beta-Hydroxybutyric Acid: 1.87 mmol/L — ABNORMAL HIGH (ref 0.05–0.27)

## 2020-07-23 LAB — MRSA PCR SCREENING: MRSA by PCR: NEGATIVE

## 2020-07-23 MED ORDER — ASPIRIN EC 81 MG PO TBEC
81.0000 mg | DELAYED_RELEASE_TABLET | Freq: Every day | ORAL | Status: DC
  Administered 2020-07-23 – 2020-07-24 (×2): 81 mg via ORAL
  Filled 2020-07-23 (×2): qty 1

## 2020-07-23 MED ORDER — ORAL CARE MOUTH RINSE
15.0000 mL | Freq: Two times a day (BID) | OROMUCOSAL | Status: DC
  Administered 2020-07-24: 15 mL via OROMUCOSAL

## 2020-07-23 MED ORDER — LACTATED RINGERS IV SOLN: INTRAVENOUS | Status: DC

## 2020-07-23 MED ORDER — AMLODIPINE BESYLATE 10 MG PO TABS
10.0000 mg | ORAL_TABLET | Freq: Every day | ORAL | Status: DC
  Administered 2020-07-24 – 2020-07-25 (×2): 10 mg via ORAL
  Filled 2020-07-23 (×2): qty 1

## 2020-07-23 MED ORDER — TORSEMIDE 20 MG PO TABS
20.0000 mg | ORAL_TABLET | Freq: Every day | ORAL | Status: DC
  Administered 2020-07-23: 20 mg via ORAL
  Filled 2020-07-23 (×2): qty 1

## 2020-07-23 MED ORDER — METOPROLOL SUCCINATE ER 25 MG PO TB24
50.0000 mg | ORAL_TABLET | Freq: Every day | ORAL | Status: DC
  Administered 2020-07-23 – 2020-07-25 (×3): 50 mg via ORAL
  Filled 2020-07-23 (×3): qty 2

## 2020-07-23 MED ORDER — CHLORHEXIDINE GLUCONATE CLOTH 2 % EX PADS
6.0000 | MEDICATED_PAD | Freq: Every day | CUTANEOUS | Status: DC
  Administered 2020-07-23 – 2020-07-24 (×2): 6 via TOPICAL

## 2020-07-23 MED ORDER — POTASSIUM CHLORIDE 10 MEQ/100ML IV SOLN
10.0000 meq | INTRAVENOUS | Status: AC
  Administered 2020-07-23 – 2020-07-24 (×4): 10 meq via INTRAVENOUS
  Filled 2020-07-23 (×4): qty 100

## 2020-07-23 MED ORDER — DEXTROSE IN LACTATED RINGERS 5 % IV SOLN: INTRAVENOUS | Status: DC

## 2020-07-23 MED ORDER — HEPARIN SODIUM (PORCINE) 5000 UNIT/ML IJ SOLN
5000.0000 [IU] | Freq: Three times a day (TID) | INTRAMUSCULAR | Status: DC
  Administered 2020-07-23 – 2020-07-25 (×7): 5000 [IU] via SUBCUTANEOUS
  Filled 2020-07-23 (×7): qty 1

## 2020-07-23 MED ORDER — AMLODIPINE BESYLATE 5 MG PO TABS
5.0000 mg | ORAL_TABLET | Freq: Every day | ORAL | Status: DC
  Administered 2020-07-23: 5 mg via ORAL
  Filled 2020-07-23: qty 1

## 2020-07-23 MED ORDER — ATORVASTATIN CALCIUM 40 MG PO TABS
80.0000 mg | ORAL_TABLET | Freq: Every day | ORAL | Status: DC
  Administered 2020-07-23 – 2020-07-24 (×2): 80 mg via ORAL
  Filled 2020-07-23 (×2): qty 2

## 2020-07-23 MED ORDER — LACTATED RINGERS IV BOLUS
20.0000 mL/kg | Freq: Once | INTRAVENOUS | Status: AC
  Administered 2020-07-23: 2220 mL via INTRAVENOUS

## 2020-07-23 MED ORDER — CHLORHEXIDINE GLUCONATE 0.12 % MT SOLN
15.0000 mL | Freq: Two times a day (BID) | OROMUCOSAL | Status: DC
  Administered 2020-07-23 – 2020-07-25 (×4): 15 mL via OROMUCOSAL
  Filled 2020-07-23 (×4): qty 15

## 2020-07-23 NOTE — ED Notes (Signed)
Provider notified of stable CBGs and endotool suggesting transition orders.

## 2020-07-23 NOTE — Progress Notes (Addendum)
PROGRESS NOTE  Jeffrey [name] OXB:[id number] DOB: 10-25-1965 DOA: 07/22/2020 PCP: [name], MD  HPI/Recap of past 24 hours: This is a 54 year old male with past medical history of insulin-dependent diabetes, alcohol use, cardiac arrest with anoxic brain injury, OSA on CPAP, CKD 3 a, cirrhosis, seizures who is a patient of the family medicine residency service who complains of generalized weakness, decreased p.o. intake and elevated blood sugars over the past week.  Per ED note patient symptoms are acute, moderate to severe, constant and persistent.  Apparently he has not taken his insulin in the past week and stated that he felt depressed due to losing his job but denied suicidal or homicidal ideation in the ED  Patient seen and examined at bedside he is doing much better denies any abdominal pain chest pain nausea or vomiting  Assessment/Plan: Principal Problem:   DKA (diabetic ketoacidosis) ([place]) Active Problems:   Anoxic brain injury ([place])   Acute kidney injury ([place])   Hepatic cirrhosis ([place])   Essential hypertension   Major depressive disorder, recurrent episode ([place])   Hyperlipidemia associated with type 2 diabetes mellitus ([place])   Candidiasis   1. DKA, suspect secondary to medication noncompliance a. Continue IV insulin, IV fluids and n.p.o. per DKA protocol is b. Anion gap is closing and he will be switched to sliding scale when his anion gap has closed c. We will continue to monitor labs  2. AKI on CKD 3b a. Continue IV fluids with lactated Ringer's currently he is on D5 with lactated Ringer's after his gap is closed b. Continue to hold nephrotoxic agents  3. Acute metabolic encephalopathy secondary to above with history of anoxic brain injury, unknown baseline a. He is alert oriented x3 now he is back to baseline b. Ammonia negative c. Continue current treatment d. Hold sedating medications: Remeron and gabapentin  4. Elevated indirect bilirubin and alk  phos with history of hepatic cirrhosis a. Had a recent unremarkable RUQ ultrasound b. Follow-up in a.m.  5. Oral candidiasis a. Oral nystatin  6. Depression a. Reportedly he lost his job recently and due to his depression has not been taking care of himself and not taking his medication. b. Consider psych consult once medically stable  7. Hyperten sion a. Uncontrolled we will continue amlodipine but will increase to 10 mg  8.  Morbid obesity patient will benefit from weight loss  9.  Type 2 diabetes mellitus uncontrolled.  His hemoglobin A1c is 7.3.  He is admitted with DKA we will continue to manage his DKA and adjust his medication  10.  Pseudohyponatremia Corrected with IV fluid  11.  Tachycardia.  Nurse has reported that his heart rate went as high as 160 transiently.  He had not received his metoprolol.  Continue metoprolol for heart rate control  Code Status: Full  Severity of Illness: The appropriate patient status for this patient is INPATIENT. Inpatient status is judged to be reasonable and necessary in order to provide the required intensity of service to ensure the patient's safety. The patient's presenting symptoms, physical exam findings, and initial radiographic and laboratory data in the context of their chronic comorbidities is felt to place them at high risk for further clinical deterioration. Furthermore, it is not anticipated that the patient will be medically stable for discharge from the hospital within 2 midnights of admission. The following factors support the patient status of inpatient.   " The patient's presenting symptoms include hyperglycemia with DKA. " The worrisome physical  exam findings include obesity hyperglycemia " The initial radiographic and laboratory data are worrisome because of *with DKA " The chronic co-morbidities include depression noncompliance   * I certify that at the point of admission it is my clinical judgment that the patient  will require inpatient hospital care spanning beyond 2 midnights from the point of admission due to high intensity of service, high risk for further deterioration and high frequency of surveillance required patient is on insulin drip    Family Communication: Patient at bedside  Disposition Plan: Continue inpatient iron gap is closing   Consultants:  None  Procedures:  None  Antimicrobials:  None  DVT prophylaxis: Heparin   Objective: Vitals:   07/23/20 0600 07/23/20 0845 07/23/20 0926 07/23/20 0927  BP: (!) 165/136  (!) 176/91 (!) 176/91  Pulse: 88  92   Resp: 14     Temp:  97.6 F (36.4 C)    TempSrc:  Oral    SpO2: 98%     Weight:      Height:       No intake or output data in the 24 hours ending 07/23/20 1010 Filed Weights   07/23/20 0545  Weight: 111 kg   Body mass index is 34.62 kg/m.  Exam:  . General: 54 y.o. year-old male well developed well nourished in no acute distress.  Alert and oriented x3.  Morbidly obese . Cardiovascular: Regular rate and rhythm with no rubs or gallops.  No thyromegaly or JVD noted.   [name] Respiratory: Clear to auscultation with no wheezes or rales. Good inspiratory effort. . Abdomen: Soft nontender nondistended with normal bowel sounds x4 quadrants. . Musculoskeletal: No lower extremity edema. 2/4 pulses in all 4 extremities. . Skin: No ulcerative lesions noted or rashes, . Psychiatry: Mood is appropriate for condition and setting    Data Reviewed: CBC: Recent Labs  Lab 07/22/20 1240 07/23/20 0618  WBC 10.7* 9.0  HGB 12.0* 12.8*  HCT 39.6 38.6*  MCV 94.1 87.3  PLT 348 160   Basic Metabolic Panel: Recent Labs  Lab 07/22/20 1240 07/23/20 0340 07/23/20 0618  NA 125* 136 133*  K 5.5* 3.6 4.8  CL 66* 87* 85*  CO2 10* 25 26  GLUCOSE 801* 173* 186*  BUN 46* 32* 32*  CREATININE 2.94* 1.86* 1.83*  CALCIUM 8.5* 8.2* 8.5*   GFR: Estimated Creatinine Clearance: 56.7 mL/min (A) (by C-G formula based on SCr of 1.83  mg/dL (H)). Liver Function Tests: Recent Labs  Lab 07/22/20 1307 07/23/20 0340  AST 27 29  ALT 19 16  ALKPHOS 128* 101  BILITOT 3.2* 2.1*  PROT 8.0 7.0  ALBUMIN 3.3* 3.0*   No results for input(s): LIPASE, AMYLASE in the last 168 hours. Recent Labs  Lab 07/22/20 1307  AMMONIA 26   Coagulation Profile: Recent Labs  Lab 07/22/20 1307  INR 1.5*   Cardiac Enzymes: No results for input(s): CKTOTAL, CKMB, CKMBINDEX, TROPONINI in the last 168 hours. BNP (last 3 results) No results for input(s): PROBNP in the last 8760 hours. HbA1C: No results for input(s): HGBA1C in the last 72 hours. CBG: Recent Labs  Lab 07/23/20 0446 07/23/20 0556 07/23/20 0658 07/23/20 0801 07/23/20 0900  GLUCAP 190* 203* 189* 176* 189*   Lipid Profile: No results for input(s): CHOL, HDL, LDLCALC, TRIG, CHOLHDL, LDLDIRECT in the last 72 hours. Thyroid Function Tests: No results for input(s): TSH, T4TOTAL, FREET4, T3FREE, THYROIDAB in the last 72 hours. Anemia Panel: No results for input(s): VITAMINB12, FOLATE, FERRITIN,  TIBC, IRON, RETICCTPCT in the last 72 hours. Urine analysis:    Component Value Date/Time   COLORURINE STRAW (A) 07/22/2020 1240   APPEARANCEUR CLEAR 07/22/2020 1240   LABSPEC 1.020 07/22/2020 1240   PHURINE 5.0 07/22/2020 1240   GLUCOSEU >=500 (A) 07/22/2020 1240   HGBUR LARGE (A) 07/22/2020 1240   BILIRUBINUR NEGATIVE 07/22/2020 1240   BILIRUBINUR NEG 11/23/2015 0942   KETONESUR 80 (A) 07/22/2020 1240   PROTEINUR 30 (A) 07/22/2020 1240   UROBILINOGEN 0.2 11/23/2015 0942   UROBILINOGEN 4.0 (H) 03/09/2015 0816   NITRITE NEGATIVE 07/22/2020 1240   LEUKOCYTESUR NEGATIVE 07/22/2020 1240   Sepsis Labs: $[email or web address]$ (procalcitonin:4,lacticidven:4)  ) Recent Results (from the past 240 hour(s))  Respiratory Panel by RT PCR (Flu A&B, Covid) - Nasopharyngeal Swab     Status: None   Collection Time: 07/22/20  1:07 PM   Specimen: Nasopharyngeal Swab  Result Value Ref Range  Status   SARS Coronavirus 2 by RT PCR NEGATIVE NEGATIVE Final    Comment: (NOTE) SARS-CoV-2 target nucleic acids are NOT DETECTED.  The SARS-CoV-2 RNA is generally detectable in upper respiratoy specimens during the acute phase of infection. The lowest concentration of SARS-CoV-2 viral copies this assay can detect is 131 copies/mL. A negative result does not preclude SARS-Cov-2 infection and should not be used as the sole basis for treatment or other patient management decisions. A negative result may occur with  improper specimen collection/handling, submission of specimen other than nasopharyngeal swab, presence of viral mutation(s) within the areas targeted by this assay, and inadequate number of viral copies (<131 copies/mL). A negative result must be combined with clinical observations, patient history, and epidemiological information. The expected result is Negative.  Fact Sheet for Patients:  PinkCheek.be  Fact Sheet for Healthcare Providers:  GravelBags.it  This test is no t yet approved or cleared by the [place] FDA and  has been authorized for detection and/or diagnosis of SARS-CoV-2 by FDA under an Emergency Use Authorization (EUA). This EUA will remain  in effect (meaning this test can be used) for the duration of the COVID-19 declaration under Section 564(b)(1) of the Act, 21 U.S.C. section 360bbb-3(b)(1), unless the authorization is terminated or revoked sooner.     Influenza A by PCR NEGATIVE NEGATIVE Final   Influenza B by PCR NEGATIVE NEGATIVE Final    Comment: (NOTE) The Xpert Xpress SARS-CoV-2/FLU/RSV assay is intended as an aid in  the diagnosis of influenza from Nasopharyngeal swab specimens and  should not be used as a sole basis for treatment. Nasal washings and  aspirates are unacceptable for Xpert Xpress SARS-CoV-2/FLU/RSV  testing.  Fact Sheet for  Patients: PinkCheek.be  Fact Sheet for Healthcare Providers: GravelBags.it  This test is not yet approved or cleared by the [place] FDA and  has been authorized for detection and/or diagnosis of SARS-CoV-2 by  FDA under an Emergency Use Authorization (EUA). This EUA will remain  in effect (meaning this test can be used) for the duration of the  Covid-19 declaration under Section 564(b)(1) of the Act, 21  U.S.C. section 360bbb-3(b)(1), unless the authorization is  terminated or revoked. Performed at [facility], [place] [address], [place], [place] [address]   MRSA PCR Screening     Status: None   Collection Time: 07/23/20  5:44 AM   Specimen: Nasal Mucosa; Nasopharyngeal  Result Value Ref Range Status   MRSA by PCR NEGATIVE NEGATIVE Final    Comment:        The  GeneXpert MRSA Assay (FDA approved for NASAL specimens only), is one component of a comprehensive MRSA colonization surveillance program. It is not intended to diagnose MRSA infection nor to guide or monitor treatment for MRSA infections. Performed at [facility], [place] [address], [place], [place] [address]       Studies: No results found.  Scheduled Meds: . amLODipine  5 mg Oral Daily  . aspirin EC  81 mg Oral QHS  . atorvastatin  80 mg Oral QHS  . Chlorhexidine Gluconate Cloth  6 each Topical Daily  . heparin  5,000 Units Subcutaneous Q8H  . metoprolol succinate  50 mg Oral Q breakfast  . nystatin  5 mL Oral QID  . torsemide  20 mg Oral Daily    Continuous Infusions: . dextrose 5% lactated ringers 125 mL/hr at 07/23/20 0507  . insulin 2.2 Units/hr (07/23/20 0914)     LOS: 1 day     Cristal Deer, MD [facility]  To reach me or the doctor on call, go to: [email or web address] Password [email or web address]  07/23/2020, 10:10 AM

## 2020-07-23 NOTE — Plan of Care (Signed)

## 2020-07-23 NOTE — Progress Notes (Signed)
2015 call received from lab - reported critical blood glucose of 1706. Bedside fingerstick at 1920 was 155. Requested recheck of BMP  to verify results. Awaiting results. Will continue to monitor.

## 2020-07-23 NOTE — ED Notes (Signed)
Pt has been much more alert and speaking clearly. He states "I was tore up when I came in". He has been tolerating liquids and voicing his needs. Pt still a bit confused at baseline

## 2020-07-24 DIAGNOSIS — E1110 Type 2 diabetes mellitus with ketoacidosis without coma: Principal | ICD-10-CM

## 2020-07-24 LAB — GLUCOSE, CAPILLARY
Glucose-Capillary: 172 mg/dL — ABNORMAL HIGH (ref 70–99)
Glucose-Capillary: 198 mg/dL — ABNORMAL HIGH (ref 70–99)
Glucose-Capillary: 202 mg/dL — ABNORMAL HIGH (ref 70–99)
Glucose-Capillary: 202 mg/dL — ABNORMAL HIGH (ref 70–99)
Glucose-Capillary: 184 mg/dL — ABNORMAL HIGH (ref 70–99)
Glucose-Capillary: 208 mg/dL — ABNORMAL HIGH (ref 70–99)
Glucose-Capillary: 220 mg/dL — ABNORMAL HIGH (ref 70–99)
Glucose-Capillary: 222 mg/dL — ABNORMAL HIGH (ref 70–99)
Glucose-Capillary: 191 mg/dL — ABNORMAL HIGH (ref 70–99)
Glucose-Capillary: 145 mg/dL — ABNORMAL HIGH (ref 70–99)
Glucose-Capillary: 135 mg/dL — ABNORMAL HIGH (ref 70–99)
Glucose-Capillary: 187 mg/dL — ABNORMAL HIGH (ref 70–99)
Glucose-Capillary: 175 mg/dL — ABNORMAL HIGH (ref 70–99)
Glucose-Capillary: 178 mg/dL — ABNORMAL HIGH (ref 70–99)
Glucose-Capillary: 196 mg/dL — ABNORMAL HIGH (ref 70–99)
Glucose-Capillary: 213 mg/dL — ABNORMAL HIGH (ref 70–99)
Glucose-Capillary: 223 mg/dL — ABNORMAL HIGH (ref 70–99)

## 2020-07-24 LAB — COMPREHENSIVE METABOLIC PANEL
ALT: 16 U/L (ref 0–44)
ALT: 20 U/L (ref 0–44)
AST: 28 U/L (ref 15–41)
AST: 37 U/L (ref 15–41)
Albumin: 2.3 g/dL — ABNORMAL LOW (ref 3.5–5.0)
Albumin: 3.2 g/dL — ABNORMAL LOW (ref 3.5–5.0)
Alkaline Phosphatase: 73 U/L (ref 38–126)
Alkaline Phosphatase: 115 U/L (ref 38–126)
Anion gap: 15 (ref 5–15)
Anion gap: 16 — ABNORMAL HIGH (ref 5–15)
BUN: 17 mg/dL (ref 6–20)
BUN: 19 mg/dL (ref 6–20)
CO2: 30 mmol/L (ref 22–32)
CO2: 34 mmol/L — ABNORMAL HIGH (ref 22–32)
Calcium: 8 mg/dL — ABNORMAL LOW (ref 8.9–10.3)
Calcium: 9 mg/dL (ref 8.9–10.3)
Chloride: 88 mmol/L — ABNORMAL LOW (ref 98–111)
Chloride: 87 mmol/L — ABNORMAL LOW (ref 98–111)
Creatinine, Ser: 1.18 mg/dL (ref 0.61–1.24)
Creatinine, Ser: 1.31 mg/dL — ABNORMAL HIGH (ref 0.61–1.24)
GFR, Estimated: >60 mL/min (ref >60)
GFR, Estimated: >60 mL/min (ref >60)
Glucose, Bld: 707 mg/dL (ref 70–99)
Glucose, Bld: 220 mg/dL — ABNORMAL HIGH (ref 70–99)
Potassium: 3.1 mmol/L — ABNORMAL LOW (ref 3.5–5.1)
Potassium: 3 mmol/L — ABNORMAL LOW (ref 3.5–5.1)
Sodium: 133 mmol/L — ABNORMAL LOW (ref 135–145)
Sodium: 137 mmol/L (ref 135–145)
Total Bilirubin: 0.9 mg/dL (ref 0.3–1.2)
Total Bilirubin: 0.8 mg/dL (ref 0.3–1.2)
Total Protein: 6 g/dL — ABNORMAL LOW (ref 6.5–8.1)
Total Protein: 8 g/dL (ref 6.5–8.1)

## 2020-07-24 LAB — BASIC METABOLIC PANEL
Anion gap: 14 (ref 5–15)
BUN: 19 mg/dL (ref 6–20)
CO2: 34 mmol/L — ABNORMAL HIGH (ref 22–32)
Calcium: 8.8 mg/dL — ABNORMAL LOW (ref 8.9–10.3)
Chloride: 87 mmol/L — ABNORMAL LOW (ref 98–111)
Creatinine, Ser: 1.32 mg/dL — ABNORMAL HIGH (ref 0.61–1.24)
GFR, Estimated: 60 mL/min — ABNORMAL LOW (ref >60)
Glucose, Bld: 191 mg/dL — ABNORMAL HIGH (ref 70–99)
Potassium: 3.6 mmol/L (ref 3.5–5.1)
Sodium: 135 mmol/L (ref 135–145)

## 2020-07-24 LAB — HEMOGLOBIN A1C
Hgb A1c MFr Bld: 8.8 % — ABNORMAL HIGH (ref 4.8–5.6)
Mean Plasma Glucose: 206 mg/dL

## 2020-07-24 MED ORDER — INSULIN ASPART 100 UNIT/ML ~~LOC~~ SOLN
0.0000 [IU] | Freq: Three times a day (TID) | SUBCUTANEOUS | Status: DC
  Administered 2020-07-24 – 2020-07-25 (×2): 5 [IU] via SUBCUTANEOUS
  Administered 2020-07-25: 3 [IU] via SUBCUTANEOUS

## 2020-07-24 MED ORDER — SODIUM CHLORIDE 0.9 % IV SOLN: INTRAVENOUS | Status: DC

## 2020-07-24 MED ORDER — INSULIN GLARGINE 100 UNIT/ML ~~LOC~~ SOLN: 29.0000 [IU] | Freq: Every day | SUBCUTANEOUS | Status: DC

## 2020-07-24 MED ORDER — POTASSIUM CHLORIDE 10 MEQ/100ML IV SOLN
10.0000 meq | INTRAVENOUS | Status: AC
  Administered 2020-07-24 (×5): 10 meq via INTRAVENOUS
  Filled 2020-07-24 (×5): qty 100

## 2020-07-24 MED ORDER — INSULIN ASPART 100 UNIT/ML ~~LOC~~ SOLN
5.0000 [IU] | Freq: Three times a day (TID) | SUBCUTANEOUS | Status: DC
  Administered 2020-07-24 – 2020-07-25 (×3): 5 [IU] via SUBCUTANEOUS

## 2020-07-24 MED ORDER — INSULIN GLARGINE 100 UNIT/ML ~~LOC~~ SOLN
20.0000 [IU] | Freq: Every day | SUBCUTANEOUS | Status: DC
  Administered 2020-07-24 – 2020-07-25 (×2): 20 [IU] via SUBCUTANEOUS
  Filled 2020-07-24 (×3): qty 0.2

## 2020-07-24 MED ORDER — INFLUENZA VAC SPLIT QUAD 0.5 ML IM SUSY
0.5000 mL | PREFILLED_SYRINGE | INTRAMUSCULAR | Status: AC
  Administered 2020-07-25: 0.5 mL via INTRAMUSCULAR
  Filled 2020-07-24: qty 0.5

## 2020-07-24 NOTE — TOC Initial Note (Addendum)
Transition of Care [facility]) - Initial/Assessment Note    Patient Details  Name: Jeffrey [name] MRN: [id number] Date of Birth: 03/15/1966  [facility]) CM/SW Contact:    Leeroy Cha, RN Phone Number: 07/24/2020, 7:31 AM  Clinical Narrative:                 54 year old male with past medical history of insulin-dependent diabetes, alcohol use, cardiac arrest with anoxic brain injury, OSA on CPAP, CKD 3 a, cirrhosis, seizures who is a patient of the family medicine residency service who complains of generalized weakness, decreased p.o. intake and elevated blood sugars over the past week.  Per ED note patient symptoms are acute, moderate to severe, constant and persistent.  Apparently he has not taken his insulin in the past week and stated that he felt depressed due to losing his job but denied suicidal or homicidal ideation in the ED.  Denied fever or chills or headache or chest pain, shortness of breath or URI symptoms.  No abdominal pain or nausea, vomiting or diarrhea.  No polyuria or polydipsia.  ED Course: Afebrile, tachycardic, hypertensive tolerating room air.  Notable labs: Na 125, K5.5,Cl 66, CO2 10, glucose 801, BUN 46, creatinine 2.94 (previously 1.6 on 9/27), AG 49, alk phos 128, ammonia 26, indirect bilirubin 3.1, direct bilirubin 3.2, INR 1.5, UA with glucosuria and 80 ketones.  He was started on insulin drip and fluids per DKA protocol. Iv insulin, kcl runs x5, bld glucoise 220, anion gap 16 k+=3.0 Following for progression and toc needs From home plan is to return to home. Expected Discharge Plan: Home/Self Care Barriers to Discharge: Barriers Unresolved (comment)   Patient Goals and CMS Choice Patient states their goals for this hospitalization and ongoing recovery are:: to go home CMS Medicare.gov Compare Post Acute Care list provided to:: Patient    Expected Discharge Plan and Services Expected Discharge Plan: Home/Self Care   Discharge Planning  Services: CM Consult   Living arrangements for the past 2 months: Single Family Home                                      Prior Living Arrangements/Services Living arrangements for the past 2 months: Single Family Home Lives with:: Spouse Patient language and need for interpreter reviewed:: Yes Do you feel safe going back to the place where you live?: Yes      Need for Family Participation in Patient Care: Yes (Comment) Care giver support system in place?: Yes (comment)   Criminal Activity/Legal Involvement Pertinent to Current Situation/Hospitalization: No - Comment as needed  Activities of Daily Living      Permission Sought/Granted                  Emotional Assessment Appearance:: Appears stated age Attitude/Demeanor/Rapport: Engaged Affect (typically observed): Calm Orientation: : Oriented to Place, Oriented to  Time, Oriented to Self, Oriented to Situation Alcohol / Substance Use: Alcohol Use Psych Involvement: No (comment)  Admission diagnosis:  DKA (diabetic ketoacidosis) ([place]) [E11.10] AKI (acute kidney injury) ([place]) [N17.9] Diabetic ketoacidosis without coma associated with diabetes mellitus due to underlying condition ([place]) [E08.10] Severe hyperglycemia due to diabetes mellitus ([place]) [E11.65] Patient Active Problem List   Diagnosis Date Noted  . DKA (diabetic ketoacidosis) ([place]) 07/22/2020  . Candidiasis 07/22/2020  . Hyponatremia 06/28/2020  . Eyebrow laceration, left, initial encounter   . Hypokalemia   .  Hyperlipidemia associated with type 2 diabetes mellitus ([place])   . MGUS (monoclonal gammopathy of unknown significance) 05/31/2020  . Seizures ([place]) 03/16/2020  . Type 2 diabetes mellitus with hyperglycemia, with long-term current use of insulin ([place]) 03/16/2020  . Tremor 03/16/2020  . Colon cancer screening 02/22/2020  . Stage 3a chronic kidney disease ([place]) 01/29/2020  . Hyperphosphatemia 01/29/2020  . Need for immunization against  influenza 06/04/2019  . Major depressive disorder, recurrent episode ([place]) 01/13/2018  . Seizure ([place]) 04/08/2017  . Proteinuria 11/23/2015  . Essential hypertension   . Hepatic cirrhosis ([place]) 03/22/2015  . Ataxia 03/20/2015  . Dysarthria 03/09/2015  . ETOH abuse   . Hyperkalemia   . Tachycardia   . Erosive esophagitis 10/10/2014  . Acute kidney injury ([place])   . OSA on CPAP 12/28/2012  . Cardiac arrest ([place]) 12/07/2012  . Anoxic brain injury ([place]) 12/07/2012  . ERECTILE DYSFUNCTION 04/14/2007  . Type 2 diabetes mellitus with diabetic neuropathy, with long-term current use of insulin ([place]) 12/04/2006  . HYPERCHOLESTEROLEMIA 12/04/2006   PCP:  [name], MD Pharmacy:   [facility] [address] [place] [address] Phone: [phone] Fax: [phone]  [facility] ([place]) [place], [place] [address] Phone: [phone] Fax: [phone]  [place] [address] Phone: [phone] Fax: [phone]  [place], [place] [address] Phone: [phone] Fax: [phone]     Social Determinants of Health (SDOH) Interventions    Readmission Risk Interventions No flowsheet data found.

## 2020-07-24 NOTE — Progress Notes (Signed)
PROGRESS NOTE    Jeffrey [name]  GHW:[id number] DOB: [birth year] DOA: 07/22/2020 PCP: [name], MD   Chief Complain: Generalized weakness, decreased oral intake  Brief Narrative: Patient is a 54 year old male with history of insulin-dependent rhabdomyolysis, alcohol use, anoxic brain injury status post cardiac arrest, OSA on CPAP, CKD stage III, cirrhosis, seizures who was brought to the emergency room with complaints of generalized weakness, decreased oral intake, elevated blood sugar over a week.  He was not taking his insulin for a week before admission date.  Patient also reported feeling depressed after losing his job but denies suicidal or homicidal ideation.  Patient was found to be in DKA on presentation.  He was admitted for DKA management.    Assessment & Plan:   Principal Problem:   DKA (diabetic ketoacidosis) ([place]) Active Problems:   Anoxic brain injury ([place])   Acute kidney injury ([place])   Hepatic cirrhosis ([place])   Essential hypertension   Major depressive disorder, recurrent episode ([place])   Hyperlipidemia associated with type 2 diabetes mellitus ([place])   Candidiasis   DKA/uncontrolled diabetes type 2: Secondary to medical noncompliance.  Was not taking his insulin.  He says he cannot afford Lantus.He was started on insulin drip.  Continue IV fluids.  Gap still has not closed.  We will continue insulin drip.   We will continue to monitor BMP. Hemoglobin A1c of 7.3. We have requested for diabetic pulmonary consultation.  We need to think about continuing on the cheaper version of insulin.  AKI on CKD stage IIIa: Continue current IV fluids we will continue to monitor BMP.  Kidney function at baseline.  Acute metabolic encephalopathy on presentation: Currently resolved.  He is alert and oriented now.  He has history of anoxic brain injury status post cardiac arrest.  Depression: Feels depressed after losing his job but denies suicidal/homicidal ideation.  He thinks  he does not need any psychiatry evaluation and he denies having depression now.  What he needs is a job,thats what he says.  Liver cirrhosis: Stable.  Follow-up with hepatology/GI as an outpatient.  Oral candidiasis: Continue nystatin  Hypertension: Continue current blood pressure regimen.  Monitor blood pressure.  Hyperosmolar hyponatremia: Secondary to hyperglycemia.  Resolved  Hypokalemia: Being supplemented and monitored.  Morbid obesity: BMI of 34.6.          DVT prophylaxis:Heparin [place] Code Status: Full Family Communication: None Status is: Inpatient  Remains inpatient appropriate because:Inpatient level of care appropriate due to severity of illness   Dispo: The patient is from: Home              Anticipated d/c is to: Home              Anticipated d/c date is: 1 day              Patient currently is not medically stable to d/c.     Consultants: None  Procedures: None  Antimicrobials:  Anti-infectives (From admission, onward)   None      Subjective:  Patient seen and examined at the bedside this morning.  Hemodynamically stable.  Comfortable.  Denies having any depression or suicidal thoughts.  Objective: Vitals:   07/24/20 0500 07/24/20 0600 07/24/20 0700 07/24/20 0728  BP: (!) 155/93 138/84 99/78 95/71   Pulse: 88 92 87 90  Resp: (!) 22 (!) 22 (!) 21   Temp:    98 F (36.7 C)  TempSrc:    Oral  SpO2: 99% 97% 99%  Weight:      Height:        Intake/Output Summary (Last 24 hours) at 07/24/2020 0806 Last data filed at 07/24/2020 0600 Gross per 24 hour  Intake 5263.3 ml  Output 2926 ml  Net 2337.3 ml   Filed Weights   07/23/20 0545  Weight: 111 kg    Examination:  General exam: Appears calm and comfortable ,Not in distress,obese HEENT:PERRL,Oral mucosa moist, Ear/Nose normal on gross exam Respiratory system: Bilateral equal air entry, normal vesicular breath sounds, no wheezes or crackles  Cardiovascular system: S1 & S2 heard, RRR.  No JVD, murmurs, rubs, gallops or clicks. No pedal edema. Gastrointestinal system: Abdomen is nondistended, soft and nontender. No organomegaly or masses felt. Normal bowel sounds heard. Central nervous system: Alert and oriented. No focal neurological deficits. Extremities: No edema, no clubbing ,no cyanosis Skin: No rashes, lesions or ulcers,no icterus ,no pallor  Data Reviewed: I have personally reviewed following labs and imaging studies  CBC: Recent Labs  Lab 07/22/20 1240 07/23/20 0618  WBC 10.7* 9.0  HGB 12.0* 12.8*  HCT 39.6 38.6*  MCV 94.1 87.3  PLT 348 947   Basic Metabolic Panel: Recent Labs  Lab 07/23/20 1339 07/23/20 1811 07/23/20 2156 07/24/20 0322 07/24/20 0555  NA 135 133* 136 133* 137  K 3.8 3.3* 2.8* 3.1* 3.0*  CL 86* 93* 84* 88* 87*  CO2 33* 22 35* 30 34*  GLUCOSE 167* QUESTIONABLE RESULTS, RECOMMEND RECOLLECT TO VERIFY 202* 707* 220*  BUN 25* 14 20 17 19   CREATININE 1.33* 1.08 1.41* 1.18 1.31*  CALCIUM 8.5* 7.3* 8.8* 8.0* 9.0   GFR: Estimated Creatinine Clearance: 79.2 mL/min (A) (by C-G formula based on SCr of 1.31 mg/dL (H)). Liver Function Tests: Recent Labs  Lab 07/22/20 1307 07/23/20 0340 07/24/20 0322 07/24/20 0555  AST 27 29 28  37  ALT 19 16 16 20   ALKPHOS 128* 101 73 115  BILITOT 3.2* 2.1* 0.9 0.8  PROT 8.0 7.0 6.0* 8.0  ALBUMIN 3.3* 3.0* 2.3* 3.2*   No results for input(s): LIPASE, AMYLASE in the last 168 hours. Recent Labs  Lab 07/22/20 1307  AMMONIA 26   Coagulation Profile: Recent Labs  Lab 07/22/20 1307  INR 1.5*   Cardiac Enzymes: No results for input(s): CKTOTAL, CKMB, CKMBINDEX, TROPONINI in the last 168 hours. BNP (last 3 results) No results for input(s): PROBNP in the last 8760 hours. HbA1C: No results for input(s): HGBA1C in the last 72 hours. CBG: Recent Labs  Lab 07/24/20 0352 07/24/20 0442 07/24/20 0531 07/24/20 0629 07/24/20 0735  GLUCAP 184* 208* 220* 222* 191*   Lipid Profile: No results for  input(s): CHOL, HDL, LDLCALC, TRIG, CHOLHDL, LDLDIRECT in the last 72 hours. Thyroid Function Tests: No results for input(s): TSH, T4TOTAL, FREET4, T3FREE, THYROIDAB in the last 72 hours. Anemia Panel: No results for input(s): VITAMINB12, FOLATE, FERRITIN, TIBC, IRON, RETICCTPCT in the last 72 hours. Sepsis Labs: No results for input(s): PROCALCITON, LATICACIDVEN in the last 168 hours.  Recent Results (from the past 240 hour(s))  Respiratory Panel by RT PCR (Flu A&B, Covid) - Nasopharyngeal Swab     Status: None   Collection Time: 07/22/20  1:07 PM   Specimen: Nasopharyngeal Swab  Result Value Ref Range Status   SARS Coronavirus 2 by RT PCR NEGATIVE NEGATIVE Final    Comment: (NOTE) SARS-CoV-2 target nucleic acids are NOT DETECTED.  The SARS-CoV-2 RNA is generally detectable in upper respiratoy specimens during the acute phase of infection. The lowest concentration  of SARS-CoV-2 viral copies this assay can detect is 131 copies/mL. A negative result does not preclude SARS-Cov-2 infection and should not be used as the sole basis for treatment or other patient management decisions. A negative result may occur with  improper specimen collection/handling, submission of specimen other than nasopharyngeal swab, presence of viral mutation(s) within the areas targeted by this assay, and inadequate number of viral copies (<131 copies/mL). A negative result must be combined with clinical observations, patient history, and epidemiological information. The expected result is Negative.  Fact Sheet for Patients:  PinkCheek.be  Fact Sheet for Healthcare Providers:  GravelBags.it  This test is no t yet approved or cleared by the [place] FDA and  has been authorized for detection and/or diagnosis of SARS-CoV-2 by FDA under an Emergency Use Authorization (EUA). This EUA will remain  in effect (meaning this test can be used) for the  duration of the COVID-19 declaration under Section 564(b)(1) of the Act, 21 U.S.C. section 360bbb-3(b)(1), unless the authorization is terminated or revoked sooner.     Influenza A by PCR NEGATIVE NEGATIVE Final   Influenza B by PCR NEGATIVE NEGATIVE Final    Comment: (NOTE) The Xpert Xpress SARS-CoV-2/FLU/RSV assay is intended as an aid in  the diagnosis of influenza from Nasopharyngeal swab specimens and  should not be used as a sole basis for treatment. Nasal washings and  aspirates are unacceptable for Xpert Xpress SARS-CoV-2/FLU/RSV  testing.  Fact Sheet for Patients: PinkCheek.be  Fact Sheet for Healthcare Providers: GravelBags.it  This test is not yet approved or cleared by the [place] FDA and  has been authorized for detection and/or diagnosis of SARS-CoV-2 by  FDA under an Emergency Use Authorization (EUA). This EUA will remain  in effect (meaning this test can be used) for the duration of the  Covid-19 declaration under Section 564(b)(1) of the Act, 21  U.S.C. section 360bbb-3(b)(1), unless the authorization is  terminated or revoked. Performed at [facility], [place] [address], [place], [place] [address]   MRSA PCR Screening     Status: None   Collection Time: 07/23/20  5:44 AM   Specimen: Nasal Mucosa; Nasopharyngeal  Result Value Ref Range Status   MRSA by PCR NEGATIVE NEGATIVE Final    Comment:        The GeneXpert MRSA Assay (FDA approved for NASAL specimens only), is one component of a comprehensive MRSA colonization surveillance program. It is not intended to diagnose MRSA infection nor to guide or monitor treatment for MRSA infections. Performed at [facility], [place] [address] [place],  [address]          Radiology Studies: No results found.      Scheduled Meds: . amLODipine  10 mg Oral Daily  . aspirin EC  81 mg Oral QHS  .  atorvastatin  80 mg Oral QHS  . chlorhexidine  15 mL Mouth Rinse BID  . Chlorhexidine Gluconate Cloth  6 each Topical Daily  . heparin  5,000 Units Subcutaneous Q8H  . mouth rinse  15 mL Mouth Rinse q12n4p  . metoprolol succinate  50 mg Oral Q breakfast  . nystatin  5 mL Oral QID   Continuous Infusions: . dextrose 5% lactated ringers 125 mL/hr at 07/24/20 0526  . insulin 3.6 mL/hr at 07/24/20 0600  . potassium chloride 10 mEq (07/24/20 0743)     LOS: 2 days    Time spent: More than 50% of that time was spent in  counseling and/or coordination of care.      Shelly Coss, MD [facility] P10/18/2021, 8:06 AM

## 2020-07-24 NOTE — Progress Notes (Signed)
Inpatient Diabetes Program Recommendations  AACE/ADA: New Consensus Statement on Inpatient Glycemic Control (2015)  Target Ranges:  Prepandial:   less than 140 mg/dL      Peak postprandial:   less than 180 mg/dL (1-2 hours)      Critically ill patients:  140 - 180 mg/dL   Lab Results  Component Value Date   GLUCAP 213 (H) 07/24/2020   HGBA1C 7.3 (H) 06/28/2020    Review of Glycemic Control  Diabetes history: DM2 Outpatient Diabetes medications: Lantus 20 units QD, Fiasp 8 units tidwc, metformin 500 mg QAM, Farxiga 10 mg QD, Ozempic 0.5 mg weekly Current orders for Inpatient glycemic control: IV insulin per [employer] for DKA. Transitioned to Lantus 20 units QD and Novolog 0-15 units tidwc + 5 units tidwc  HgbA1C - 7.3%.  Inpatient Diabetes Program Recommendations:     Agree with orders.  Pt has not been taking diabetes meds (insulin and OHAs) d/t losing job and insurance. Will need affordable insulin for home. Recommend 70/30 15 units bid.  Will continue to follow.  Thank you. Lorenda Peck, RD, LDN, CDE Inpatient Diabetes Coordinator [phone]

## 2020-07-24 NOTE — Progress Notes (Signed)
0428 - contacted by lab for critically high glucose, 702. Requested recheck of entire CMP. Previous bedside CBG at 0350 was less than 200. This is the second time this nurse has requested lab recheck during this shift. Awaiting results, will continue to monitor.

## 2020-07-25 LAB — BASIC METABOLIC PANEL
Anion gap: 17 — ABNORMAL HIGH (ref 5–15)
BUN: 17 mg/dL (ref 6–20)
CO2: 30 mmol/L (ref 22–32)
Calcium: 8.8 mg/dL — ABNORMAL LOW (ref 8.9–10.3)
Chloride: 91 mmol/L — ABNORMAL LOW (ref 98–111)
Creatinine, Ser: 1.24 mg/dL (ref 0.61–1.24)
GFR, Estimated: >60 mL/min (ref >60)
Glucose, Bld: 239 mg/dL — ABNORMAL HIGH (ref 70–99)
Potassium: 3.3 mmol/L — ABNORMAL LOW (ref 3.5–5.1)
Sodium: 138 mmol/L (ref 135–145)

## 2020-07-25 LAB — GLUCOSE, CAPILLARY
Glucose-Capillary: 244 mg/dL — ABNORMAL HIGH (ref 70–99)
Glucose-Capillary: 236 mg/dL — ABNORMAL HIGH (ref 70–99)

## 2020-07-25 MED ORDER — LABETALOL HCL 5 MG/ML IV SOLN
10.0000 mg | Freq: Once | INTRAVENOUS | Status: AC
  Administered 2020-07-25: 10 mg via INTRAVENOUS
  Filled 2020-07-25: qty 4

## 2020-07-25 MED ORDER — POTASSIUM CHLORIDE ER 20 MEQ PO TBCR: 20.0000 meq | EXTENDED_RELEASE_TABLET | Freq: Every day | ORAL | 1 refills | Status: DC

## 2020-07-25 MED ORDER — METFORMIN HCL ER 500 MG PO TB24: 500.0000 mg | ORAL_TABLET | Freq: Every day | ORAL | 1 refills | Status: DC

## 2020-07-25 MED ORDER — METOPROLOL SUCCINATE ER 50 MG PO TB24: 50.0000 mg | ORAL_TABLET | Freq: Every day | ORAL | 1 refills | Status: DC

## 2020-07-25 MED ORDER — MIRTAZAPINE 30 MG PO TABS
30.0000 mg | ORAL_TABLET | Freq: Every day | ORAL | 1 refills | Status: DC
Start: 2020-07-25 — End: 2020-11-16

## 2020-07-25 MED ORDER — TORSEMIDE 20 MG PO TABS
20.0000 mg | ORAL_TABLET | Freq: Every day | ORAL | 1 refills | Status: DC
Start: 2020-07-25 — End: 2020-11-04

## 2020-07-25 MED ORDER — FARXIGA 10 MG PO TABS
10.0000 mg | ORAL_TABLET | Freq: Every day | ORAL | 1 refills | Status: DC
Start: 2020-07-25 — End: 2020-11-22

## 2020-07-25 MED ORDER — CHLORTHALIDONE 25 MG PO TABS
25.0000 mg | ORAL_TABLET | Freq: Every day | ORAL | 1 refills | Status: DC
Start: 2020-07-25 — End: 2020-11-04

## 2020-07-25 MED ORDER — AMLODIPINE BESYLATE 10 MG PO TABS
10.0000 mg | ORAL_TABLET | Freq: Every day | ORAL | 1 refills | Status: DC
Start: 2020-07-26 — End: 2020-11-04

## 2020-07-25 MED ORDER — POTASSIUM CHLORIDE CRYS ER 20 MEQ PO TBCR
40.0000 meq | EXTENDED_RELEASE_TABLET | Freq: Once | ORAL | Status: AC
  Administered 2020-07-25: 40 meq via ORAL
  Filled 2020-07-25: qty 2

## 2020-07-25 MED ORDER — NAPROXEN 500 MG PO TABS
500.0000 mg | ORAL_TABLET | Freq: Two times a day (BID) | ORAL | Status: DC | PRN
Start: 2020-07-25 — End: 2020-11-04

## 2020-07-25 MED ORDER — NOVOLIN 70/30 FLEXPEN RELION (70-30) 100 UNIT/ML ~~LOC~~ SUPN: 15.0000 [IU] | PEN_INJECTOR | Freq: Two times a day (BID) | SUBCUTANEOUS | 1 refills | Status: DC

## 2020-07-25 MED ORDER — NYSTATIN 100000 UNIT/ML MT SUSP: 5.0000 mL | Freq: Four times a day (QID) | OROMUCOSAL | 0 refills | Status: DC

## 2020-07-25 MED ORDER — "PEN NEEDLES 3/16"" 31G X 5 MM MISC": 1.0000 | Freq: Two times a day (BID) | 1 refills | Status: DC

## 2020-07-25 NOTE — Progress Notes (Signed)
Discharge instructions explained to patient and all questions asked. Niece bedside to drive patient.

## 2020-07-25 NOTE — Care Plan (Signed)
Notified on call NP of sustained hypertension (160-180's SBP / 100-110 DBP).

## 2020-07-25 NOTE — Discharge Summary (Addendum)
Physician Discharge Summary  Jeffrey [name]:[id number] DOB: Mar 06, 1966 DOA: 07/22/2020  PCP: [name], MD  Admit date: 07/22/2020 Discharge date: 07/25/2020  Admitted From: Home Disposition:  Home  Discharge Condition:Stable CODE STATUS:FULL Diet recommendation: Carbohydrate consistent  Brief/Interim Summary: Patient is a 54 year old male with history of insulin-dependent rhabdomyolysis, alcohol use, anoxic brain injury status post cardiac arrest, OSA on CPAP, CKD stage III, cirrhosis, seizures who was brought to the emergency room with complaints of generalized weakness, decreased oral intake, elevated blood sugar over a week.  He was not taking his insulin for a week before admission date.  Patient also reported feeling depressed after losing his job but denies suicidal or homicidal ideation.  Patient was found to be in DKA on presentation.  He was admitted for DKA management with insulin drip.  DKA resolved.  Diabetic coordinator was following.  Patient expressed that he could not afford the previous insulin because he lost his job.  Will be discharged home 70/30 insulin.  Following  problems were addressed during his hospitalization:  DKA/uncontrolled diabetes type 2: Secondary to medical noncompliance.  Was not taking his insulin.  He says he cannot afford Lantus.He was started on insulin drip.   Hemoglobin A1c of 7.3. Diabetic coordinator  recommended 70/30 insulin  AKI on CKD stage IIIa:  Kidney function at baseline now.  Acute metabolic encephalopathy on presentation: Currently resolved.  He is alert and oriented now.  He has history of anoxic brain injury status post cardiac arrest.  Depression: Feels depressed after losing his job but denies suicidal/homicidal ideation.  He thinks he does not need any psychiatry evaluation and he denies having depression now.  What he needs is a job,thats what he says.  Continue mirtazapine at home.  Liver cirrhosis: Stable.   Follow-up with hepatology/GI as an outpatient.  Oral candidiasis: Continue nystatin  Hypertension: Continue current blood pressure regimen.  Continue home medications.  Hyperosmolar hyponatremia: Secondary to hyperglycemia.  Resolved  Hypokalemia: Being supplemented.  Morbid obesity: BMI of 34.6.  Discharge Diagnoses:  Principal Problem:   DKA (diabetic ketoacidosis) ([place]) Active Problems:   Anoxic brain injury ([place])   Acute kidney injury ([place])   Hepatic cirrhosis ([place])   Essential hypertension   Major depressive disorder, recurrent episode ([place])   Hyperlipidemia associated with type 2 diabetes mellitus ([place])   Candidiasis    Discharge Instructions  Discharge Instructions    Diet Carb Modified   Complete by: As directed    Discharge instructions   Complete by: As directed    1)Please take prescribed medications as instructed. 2)Monitor your blood sugars at home 3)Follow up with your PCP in a week.   Increase activity slowly   Complete by: As directed      Allergies as of 07/25/2020      Reactions   Lisinopril Other (See Comments)   Pt reports nose bleed.   Drug Ingredient [spironolactone]    Hyperkalemia      Medication List    STOP taking these medications   Fiasp FlexTouch 100 UNIT/ML FlexTouch Pen Generic drug: insulin aspart   Lantus SoloStar 100 UNIT/ML Solostar Pen Generic drug: insulin glargine   lidocaine-prilocaine cream Commonly known as: EMLA     TAKE these medications   amLODipine 10 MG tablet Commonly known as: NORVASC Take 1 tablet (10 mg total) by mouth daily. Start taking on: July 26, 2020 What changed:   medication strength  how much to take   aspirin EC 81 MG tablet  Take 1 tablet (81 mg total) by mouth at bedtime. What changed: when to take this   atorvastatin 80 MG tablet Commonly known as: LIPITOR Take 1 tablet (80 mg total) by mouth at bedtime.   chlorthalidone 25 MG tablet Commonly known as: HYGROTON Take  1 tablet (25 mg total) by mouth daily.   Dexcom G6 Sensor Misc 1 Units by Does not apply route as directed.   divalproex 500 MG 24 hr tablet Commonly known as: Depakote ER Take 2 tablets (1,000 mg total) by mouth at bedtime. What changed: when to take this   Farxiga 10 MG Tabs tablet Generic drug: dapagliflozin propanediol Take 1 tablet (10 mg total) by mouth daily.   folic acid 1 MG tablet Commonly known as: FOLVITE Take 1 tablet (1 mg total) by mouth daily.   gabapentin 300 MG capsule Commonly known as: NEURONTIN Take 3 capsules (900 mg total) by mouth 3 (three) times daily.   metFORMIN 500 MG 24 hr tablet Commonly known as: GLUCOPHAGE-XR Take 1 tablet (500 mg total) by mouth daily with breakfast.   metoprolol succinate 50 MG 24 hr tablet Commonly known as: Toprol XL Take 1 tablet (50 mg total) by mouth daily.   mirtazapine 30 MG tablet Commonly known as: REMERON Take 1 tablet (30 mg total) by mouth at bedtime.   multivitamin with minerals Tabs tablet Take 1 tablet by mouth at bedtime.   naproxen 500 MG tablet Commonly known as: NAPROSYN Take 1 tablet (500 mg total) by mouth every 12 (twelve) hours as needed. What changed:   when to take this  reasons to take this   NovoLIN 70/30 FlexPen Relion (70-30) 100 UNIT/ML KwikPen Generic drug: insulin isophane & regular human Inject 15 Units into the skin in the morning and at bedtime.   nystatin 100000 UNIT/ML suspension Commonly known as: MYCOSTATIN Take 5 mLs (500,000 Units total) by mouth 4 (four) times daily.   omeprazole 20 MG capsule Commonly known as: PRILOSEC Take 1 capsule (20 mg total) by mouth daily.   Ozempic (0.25 or 0.5 MG/DOSE) 2 MG/1.5ML Sopn Generic drug: Semaglutide(0.25 or 0.5MG /DOS) Inject 0.5 mg into the skin once a week. What changed: additional instructions   Pen Needles 3/16" 31G X 5 MM Misc 1 each by Does not apply route in the morning and at bedtime.   Potassium Chloride ER 20 MEQ  Tbcr Take 20 mEq by mouth daily. What changed: when to take this   sildenafil 100 MG tablet Commonly known as: VIAGRA TAKE 1 TABLET BY MOUTH ONCE DAILY AS NEEDED FOR ERECTILE DYSFUNCTION What changed: See the new instructions.   thiamine 100 MG tablet Commonly known as: Vitamin B-1 Take 1 tablet (100 mg total) by mouth daily. What changed: how much to take   torsemide 20 MG tablet Commonly known as: DEMADEX Take 1 tablet (20 mg total) by mouth daily.   True Metrix Blood Glucose Test test strip Generic drug: glucose blood Please use to check blood sugar up to three times daily.   vitamin E 180 MG (400 UNITS) capsule Take 400 Units by mouth daily.       Follow-up Information    [name], MD. Schedule an appointment as soon as possible for a visit in 1 week(s).   Specialty: Family Medicine Contact information: [address] [address] [phone]              Allergies  Allergen Reactions  . Lisinopril Other (See Comments)  Pt reports nose bleed.  . Drug Ingredient [Spironolactone]     Hyperkalemia    Consultations:  None   Procedures/Studies: CT Head Wo Contrast  Result Date: 06/28/2020 CLINICAL DATA:  Headache forehead laceration EXAM: CT HEAD WITHOUT CONTRAST TECHNIQUE: Contiguous axial images were obtained from the base of the skull through the vertex without intravenous contrast. COMPARISON:  04/03/2020 FINDINGS: Brain: No evidence of acute infarction, hemorrhage, hydrocephalus, extra-axial collection or mass lesion/mass effect. Mild cerebral volume loss, unchanged. Vascular: No hyperdense vessel or unexpected calcification. Skull: Normal. Negative for fracture or focal lesion. Sinuses/Orbits: No acute finding. Other: Left supraorbital soft tissue swelling. IMPRESSION: 1. No acute intracranial findings. 2. Left supraorbital soft tissue swelling. Electronically Signed   By: Davina Poke D.O.   On: 06/28/2020 10:54   US Abdomen  Limited RUQ  Result Date: 06/28/2020 CLINICAL DATA:  Cirrhosis. EXAM: ULTRASOUND ABDOMEN LIMITED RIGHT UPPER QUADRANT COMPARISON:  Right upper quadrant ultrasound dated March 09, 2015. FINDINGS: Gallbladder: Contracted with mild wall thickening visualized. No gallstones. No sonographic Murphy sign noted by sonographer. Common bile duct: Diameter: 3 mm, normal. Liver: No focal lesion identified. Coarsened heterogeneously increased parenchymal echogenicity. Portal vein is patent on color Doppler imaging with normal direction of blood flow towards the liver. Other: None. IMPRESSION: 1. No acute abnormality. 2. Coarsened heterogeneously increased hepatic parenchymal echogenicity consistent with provided history of cirrhosis. 3. Contracted gallbladder with mild wall thickening, likely related to underdistention. Electronically Signed   By: Titus Dubin M.D.   On: 06/28/2020 17:59      Subjective: Patient seen and examined at the bedside this morning.  Hemodynamically stable for discharge today.  Discharge Exam: Vitals:   07/25/20 0738 07/25/20 0800  BP: (!) 154/96 (!) 155/100  Pulse: 91 90  Resp:  19  Temp:  98.4 F (36.9 C)  SpO2:  96%   Vitals:   07/25/20 0635 07/25/20 0700 07/25/20 0738 07/25/20 0800  BP: (!) 169/108 (!) 150/90 (!) 154/96 (!) 155/100  Pulse: 92 90 91 90  Resp: (!) 21 18  19   Temp:    98.4 F (36.9 C)  TempSrc:    Oral  SpO2: 97% 96%  96%  Weight:      Height:        General: Pt is alert, awake, not in acute distress Cardiovascular: RRR, S1/S2 +, no rubs, no gallops Respiratory: CTA bilaterally, no wheezing, no rhonchi Abdominal: Soft, NT, ND, bowel sounds + Extremities: no edema, no cyanosis    The results of significant diagnostics from this hospitalization (including imaging, microbiology, ancillary and laboratory) are listed below for reference.     Microbiology: Recent Results (from the past 240 hour(s))  Respiratory Panel by RT PCR (Flu A&B, Covid) -  Nasopharyngeal Swab     Status: None   Collection Time: 07/22/20  1:07 PM   Specimen: Nasopharyngeal Swab  Result Value Ref Range Status   SARS Coronavirus 2 by RT PCR NEGATIVE NEGATIVE Final    Comment: (NOTE) SARS-CoV-2 target nucleic acids are NOT DETECTED.  The SARS-CoV-2 RNA is generally detectable in upper respiratoy specimens during the acute phase of infection. The lowest concentration of SARS-CoV-2 viral copies this assay can detect is 131 copies/mL. A negative result does not preclude SARS-Cov-2 infection and should not be used as the sole basis for treatment or other patient management decisions. A negative result may occur with  improper specimen collection/handling, submission of specimen other than nasopharyngeal swab, presence of viral mutation(s) within the areas  targeted by this assay, and inadequate number of viral copies (<131 copies/mL). A negative result must be combined with clinical observations, patient history, and epidemiological information. The expected result is Negative.  Fact Sheet for Patients:  PinkCheek.be  Fact Sheet for Healthcare Providers:  GravelBags.it  This test is no t yet approved or cleared by the [place] FDA and  has been authorized for detection and/or diagnosis of SARS-CoV-2 by FDA under an Emergency Use Authorization (EUA). This EUA will remain  in effect (meaning this test can be used) for the duration of the COVID-19 declaration under Section 564(b)(1) of the Act, 21 U.S.C. section 360bbb-3(b)(1), unless the authorization is terminated or revoked sooner.     Influenza A by PCR NEGATIVE NEGATIVE Final   Influenza B by PCR NEGATIVE NEGATIVE Final    Comment: (NOTE) The Xpert Xpress SARS-CoV-2/FLU/RSV assay is intended as an aid in  the diagnosis of influenza from Nasopharyngeal swab specimens and  should not be used as a sole basis for treatment. Nasal washings and   aspirates are unacceptable for Xpert Xpress SARS-CoV-2/FLU/RSV  testing.  Fact Sheet for Patients: PinkCheek.be  Fact Sheet for Healthcare Providers: GravelBags.it  This test is not yet approved or cleared by the [place] FDA and  has been authorized for detection and/or diagnosis of SARS-CoV-2 by  FDA under an Emergency Use Authorization (EUA). This EUA will remain  in effect (meaning this test can be used) for the duration of the  Covid-19 declaration under Section 564(b)(1) of the Act, 21  U.S.C. section 360bbb-3(b)(1), unless the authorization is  terminated or revoked. Performed at [facility], [place] [address] [place], [place] [address]   MRSA PCR Screening     Status: None   Collection Time: 07/23/20  5:44 AM   Specimen: Nasal Mucosa; Nasopharyngeal  Result Value Ref Range Status   MRSA by PCR NEGATIVE NEGATIVE Final    Comment:        The GeneXpert MRSA Assay (FDA approved for NASAL specimens only), is one component of a comprehensive MRSA colonization surveillance program. It is not intended to diagnose MRSA infection nor to guide or monitor treatment for MRSA infections. Performed at [facility], [place] [address] [place], [place] [address]      Labs: BNP (last 3 results) No results for input(s): BNP in the last 8760 hours. Basic Metabolic Panel: Recent Labs  Lab 07/23/20 2156 07/24/20 0322 07/24/20 0555 07/24/20 1254 07/25/20 0256  NA 136 133* 137 135 138  K 2.8* 3.1* 3.0* 3.6 3.3*  CL 84* 88* 87* 87* 91*  CO2 35* 30 34* 34* 30  GLUCOSE 202* 707* 220* 191* 239*  BUN 20 17 19 19 17   CREATININE 1.41* 1.18 1.31* 1.32* 1.24  CALCIUM 8.8* 8.0* 9.0 8.8* 8.8*   Liver Function Tests: Recent Labs  Lab 07/22/20 1307 07/23/20 0340 07/24/20 0322 07/24/20 0555  AST 27 29 28  37  ALT 19 16 16 20   ALKPHOS 128* 101 73 115  BILITOT 3.2* 2.1* 0.9 0.8  PROT  8.0 7.0 6.0* 8.0  ALBUMIN 3.3* 3.0* 2.3* 3.2*   No results for input(s): LIPASE, AMYLASE in the last 168 hours. Recent Labs  Lab 07/22/20 1307  AMMONIA 26   CBC: Recent Labs  Lab 07/22/20 1240 07/23/20 0618  WBC 10.7* 9.0  HGB 12.0* 12.8*  HCT 39.6 38.6*  MCV 94.1 87.3  PLT 348 311   Cardiac Enzymes: No results for input(s): CKTOTAL, CKMB, CKMBINDEX,  TROPONINI in the last 168 hours. BNP: Invalid input(s): POCBNP CBG: Recent Labs  Lab 07/24/20 1408 07/24/20 1615 07/24/20 2055 07/25/20 0733 07/25/20 1209  GLUCAP 196* 213* 223* 244* 236*   D-Dimer No results for input(s): DDIMER in the last 72 hours. Hgb A1c Recent Labs    07/23/20 2156  HGBA1C 8.8*   Lipid Profile No results for input(s): CHOL, HDL, LDLCALC, TRIG, CHOLHDL, LDLDIRECT in the last 72 hours. Thyroid function studies No results for input(s): TSH, T4TOTAL, T3FREE, THYROIDAB in the last 72 hours.  Invalid input(s): FREET3 Anemia work up No results for input(s): VITAMINB12, FOLATE, FERRITIN, TIBC, IRON, RETICCTPCT in the last 72 hours. Urinalysis    Component Value Date/Time   COLORURINE STRAW (A) 07/22/2020 1240   APPEARANCEUR CLEAR 07/22/2020 1240   LABSPEC 1.020 07/22/2020 1240   PHURINE 5.0 07/22/2020 1240   GLUCOSEU >=500 (A) 07/22/2020 1240   HGBUR LARGE (A) 07/22/2020 1240   BILIRUBINUR NEGATIVE 07/22/2020 1240   BILIRUBINUR NEG 11/23/2015 0942   KETONESUR 80 (A) 07/22/2020 1240   PROTEINUR 30 (A) 07/22/2020 1240   UROBILINOGEN 0.2 11/23/2015 0942   UROBILINOGEN 4.0 (H) 03/09/2015 0816   NITRITE NEGATIVE 07/22/2020 1240   LEUKOCYTESUR NEGATIVE 07/22/2020 1240   Sepsis Labs Invalid input(s): PROCALCITONIN,  WBC,  LACTICIDVEN Microbiology Recent Results (from the past 240 hour(s))  Respiratory Panel by RT PCR (Flu A&B, Covid) - Nasopharyngeal Swab     Status: None   Collection Time: 07/22/20  1:07 PM   Specimen: Nasopharyngeal Swab  Result Value Ref Range Status   SARS  Coronavirus 2 by RT PCR NEGATIVE NEGATIVE Final    Comment: (NOTE) SARS-CoV-2 target nucleic acids are NOT DETECTED.  The SARS-CoV-2 RNA is generally detectable in upper respiratoy specimens during the acute phase of infection. The lowest concentration of SARS-CoV-2 viral copies this assay can detect is 131 copies/mL. A negative result does not preclude SARS-Cov-2 infection and should not be used as the sole basis for treatment or other patient management decisions. A negative result may occur with  improper specimen collection/handling, submission of specimen other than nasopharyngeal swab, presence of viral mutation(s) within the areas targeted by this assay, and inadequate number of viral copies (<131 copies/mL). A negative result must be combined with clinical observations, patient history, and epidemiological information. The expected result is Negative.  Fact Sheet for Patients:  PinkCheek.be  Fact Sheet for Healthcare Providers:  GravelBags.it  This test is no t yet approved or cleared by the [place] FDA and  has been authorized for detection and/or diagnosis of SARS-CoV-2 by FDA under an Emergency Use Authorization (EUA). This EUA will remain  in effect (meaning this test can be used) for the duration of the COVID-19 declaration under Section 564(b)(1) of the Act, 21 U.S.C. section 360bbb-3(b)(1), unless the authorization is terminated or revoked sooner.     Influenza A by PCR NEGATIVE NEGATIVE Final   Influenza B by PCR NEGATIVE NEGATIVE Final    Comment: (NOTE) The Xpert Xpress SARS-CoV-2/FLU/RSV assay is intended as an aid in  the diagnosis of influenza from Nasopharyngeal swab specimens and  should not be used as a sole basis for treatment. Nasal washings and  aspirates are unacceptable for Xpert Xpress SARS-CoV-2/FLU/RSV  testing.  Fact Sheet for  Patients: PinkCheek.be  Fact Sheet for Healthcare Providers: GravelBags.it  This test is not yet approved or cleared by the [place] FDA and  has been authorized for detection and/or diagnosis of SARS-CoV-2 by  FDA  under an Emergency Use Authorization (EUA). This EUA will remain  in effect (meaning this test can be used) for the duration of the  Covid-19 declaration under Section 564(b)(1) of the Act, 21  U.S.C. section 360bbb-3(b)(1), unless the authorization is  terminated or revoked. Performed at [facility], [place] [address] [place], [place] [address]   MRSA PCR Screening     Status: None   Collection Time: 07/23/20  5:44 AM   Specimen: Nasal Mucosa; Nasopharyngeal  Result Value Ref Range Status   MRSA by PCR NEGATIVE NEGATIVE Final    Comment:        The GeneXpert MRSA Assay (FDA approved for NASAL specimens only), is one component of a comprehensive MRSA colonization surveillance program. It is not intended to diagnose MRSA infection nor to guide or monitor treatment for MRSA infections. Performed at [facility], [place] [address], [place], [place] [address]     Please note: You were cared for by a hospitalist during your hospital stay. Once you are discharged, your primary care physician will handle any further medical issues. Please note that NO REFILLS for any discharge medications will be authorized once you are discharged, as it is imperative that you return to your primary care physician (or establish a relationship with a primary care physician if you do not have one) for your post hospital discharge needs so that they can reassess your need for medications and monitor your lab values.    Time coordinating discharge: 40 minutes  SIGNED:   Shelly Coss, MD  [facility] 07/25/2020, 12:18 PM Pager [phone]  If 7PM-7AM, please contact  night-coverage [email or web address] Password [email or web address]

## 2020-07-25 NOTE — Progress Notes (Signed)
Inpatient Diabetes Program Recommendations  AACE/ADA: New Consensus Statement on Inpatient Glycemic Control (2015)  Target Ranges:  Prepandial:   less than 140 mg/dL      Peak postprandial:   less than 180 mg/dL (1-2 hours)      Critically ill patients:  140 - 180 mg/dL   Lab Results  Component Value Date   GLUCAP 244 (H) 07/25/2020   HGBA1C 8.8 (H) 07/23/2020    Review of Glycemic Control  FBS 244 this am. Post-prandials above goal of 140-180 mg/dL.  Inpatient Diabetes Program Recommendations:     Increase Lantus to 22 units QD Increase Novolog to 6 units tidwc for meal coverage insulin.  For discharge:  Novolin ReliOn pen 70/30 15 units bid. May need Novolin R for sliding scale if needed Can purchase insulin pens for $44 (5 pens) Will also need insulin pen needles - (#[id number])  Continue to follow closely.  Thank you. Lorenda Peck, RD, LDN, CDE Inpatient Diabetes Coordinator [phone]

## 2020-07-25 NOTE — Discharge Instructions (Signed)
Correction Insulin  Correction insulin, also called corrective insulin or a supplemental dose, is a small amount of insulin that can be used to lower your blood sugar (glucose) if it is too high. You may be instructed to check your blood glucose at certain times of the day and to use correction insulin as needed to lower your blood glucose to your target range. Correction insulin is primarily used as part of diabetes management. It may also be prescribed for people who do not have diabetes. What is a correction scale? A correction scale, also called a sliding scale, is prescribed by your health care provider to help you determine when you need correction insulin. Your correction scale is based on your individual treatment goals, and it has two parts:  Ranges of blood glucose levels.  How much correction insulin to give yourself if your blood sugar falls within a certain range. If your blood glucose is in your desired range, you will not need correction insulin and you should take your normal insulin dose. What type of insulin do I need? Your health care provider may prescribe rapid-acting or short-acting insulin for you to use as correction insulin. Rapid-acting insulin:  Starts working quickly, in as little as 5 minutes.  Can last for 3-6 hours.  Works well when taken right before a meal to quickly lower blood glucose. Short-acting insulin:  Starts working in about 30 minutes.  Can last for 6-8 hours.  Should be taken about 30 minutes before you start eating a meal. Talk with your health care provider or pharmacist about which type of correction insulin to take and when to take it. If you use insulin to control your diabetes, you should use correction insulin in addition to the longer-acting (basal) insulin that you normally use. How do I manage my blood glucose with correction insulin? Giving a correction dose  Check your blood glucose as directed by your health care provider.  Use  your correction scale to find the range that your blood glucose is in.  Identify the units of insulin that match your blood glucose range.  Make sure you have food available that you can eat in the next 15-30 minutes, after your correction dose.  Give yourself the dose of correction insulin that your health care provider has prescribed in your correction scale. Always make sure you are using the right type of insulin. ? If your correction insulin is rapid-acting, start eating a meal within 15 minutes after your correction dose to keep your blood glucose from getting too low. ? If your correction insulin is short-acting, start eating a meal within 30 minutes after your correction dose to keep your blood glucose from getting too low. Keeping a blood glucose log   Write down your blood glucose test results and the amount of insulin that you give yourself. Do this every time you check blood glucose or take insulin. Bring this log with you to your medical visits. This information will help your health care provider to manage your medicines.  Note anything that may affect your blood glucose, such as: ? Changes in normal exercise or activity. ? Changes in your normal schedule, such as changes in your sleep routine, going on vacation, changing your diet, or holidays. ? New over-the-counter or prescription medicines. ? Illness, stress, or anxiety. ? Changes in the time that you took your medicine or insulin. ? Changes in your meals, such as skipping a meal, having a late meal, or dining out. ? Eating  things that may affect blood glucose, such as snacks, meal portions that are larger than normal, drinks that contain sugar, or eating less than usual. What do I need to know about hyperglycemia and hypoglycemia? What is hyperglycemia? Hyperglycemia, also called high blood glucose, occurs when blood glucose is too high. Make sure you know the early signs of hyperglycemia, such as:  Increased  thirst.  Hunger.  Feeling very tired.  Needing to urinate more often than usual.  Blurry vision. What is hypoglycemia? Hypoglycemia is also called low blood glucose. Be aware of "stacking" your insulin doses. This happens when you correct a high blood glucose by giving yourself extra insulin too soon after a previous correction dose or mealtime dose. This may cause you to have too much insulin in your body and may put you at risk for hypoglycemia. Hypoglycemia occurs with a blood glucose level at or below 70 mg/dL (3.9 mmol/L). It is important to know the symptoms of hypoglycemia and treat it right away. Always have a 15-gram rapid-acting carbohydrate snack with you to treat low blood glucose. Family members and close friends should also know the symptoms and should understand how to treat hypoglycemia, in case you are not able to treat yourself. What are the symptoms of hypoglycemia? Hypoglycemia symptoms can include:  Hunger.  Anxiety.  Sweating and feeling clammy.  Confusion.  Dizziness or light-headedness.  Sleepiness.  Nausea.  Increased heart rate.  Headache.  Blurry vision.  Jerky movements that you cannot control (seizure).  Nightmares.  Tingling or numbness around the mouth, lips, or tongue.  A change in speech.  Decreased ability to concentrate.  A change in coordination.  Restless sleep.  Tremors or shakes.  Fainting.  Irritability. How do I treat hypoglycemia? If you are alert and able to swallow safely, follow the 15:15 rule:  Take 15 grams of a rapid-acting carbohydrate. Rapid-acting options include: ? 1 tube of glucose gel. ? 3 glucose pills. ? 6-8 pieces of hard candy. ? 4 oz (120 mL) of fruit juice. ? 4 oz (120 mL) of regular (not diet) soda.  Check your blood glucose 15 minutes after you take the carbohydrate. ? If the repeat blood glucose level is still at or below 70 mg/dL (3.9 mmol/L), take 15 grams of a carbohydrate again. ? If  your blood glucose level does not increase above 70 mg/dL (3.9 mmol/L) after 3 tries, seek emergency medical care.  After your blood glucose level returns to normal, eat a meal or a snack within 1 hour.  How do I treat severe hypoglycemia? Severe hypoglycemia is when your blood glucose level is at or below 54 mg/dL (3 mmol/L). Severe hypoglycemia is an emergency. Do not wait to see if the symptoms will go away. Get medical help right away. Call your local emergency services (911 in the [place] Do not drive yourself to the hospital. If you have severe hypoglycemia and you cannot eat or drink, you may need an injection of glucagon. A family member or close friend should learn how to check your blood glucose and how to give you a glucagon injection. Ask your health care provider if you need to have an emergency glucagon injection kit available. Severe hypoglycemia may need to be treated in a hospital. The treatment may include getting glucose through an IV tube. You may also need treatment for the cause of your hypoglycemia. Why do I need correction insulin if I do not have diabetes? If you do not have diabetes, your  health care provider may prescribe insulin because:  Keeping your blood glucose in the target range is important for your overall health.  You are taking medicines that cause your blood glucose to be higher than normal. Contact a health care provider if:  You develop low blood glucose that you are not able to treat yourself.  You have high blood glucose that you are not able to correct with correction insulin.  Your blood glucose is often too low.  You used emergency glucagon to treat low blood glucose. Get help right away if:  You become unresponsive. If this happens, someone else should call emergency services (911 in the [place] right away.  Your blood glucose is lower than 54 mg/dL (3.0 mmol/L).  You become confused or you have trouble thinking clearly.  You have difficulty breathing.  Summary  Correction insulin is a small amount of insulin that can be used to lower your blood sugar (glucose) if it is too high.  Talk with your health care provider or pharmacist about which type of correction insulin to take and when to take it. If you use insulin to control your diabetes, you should use correction insulin in addition to the longer-acting (basal) insulin that you normally use.  You may be instructed to check your blood glucose at certain times of the day and to use correction insulin as needed to lower your blood glucose to your target range. Always keep a log of your blood glucose values and the amount of insulin that you used.  It is important to know the symptoms of hypoglycemia and treat it right away. Always have a 15-gram rapid-acting carbohydrate snack with you to treat low blood glucose. Family members and close friends should also know the symptoms and should understand how to treat hypoglycemia, in case you are not able to treat yourself. This information is not intended to replace advice given to you by your health care provider. Make sure you discuss any questions you have with your health care provider. Document Revised: 10/03/2016 Document Reviewed: 06/21/2016 Elsevier Patient Education  2020 [employer]  

## 2020-07-26 ENCOUNTER — Other Ambulatory Visit: Payer: Self-pay | Admitting: *Deleted

## 2020-07-27 ENCOUNTER — Other Ambulatory Visit: Payer: Self-pay | Admitting: Family Medicine

## 2020-07-27 DIAGNOSIS — E1169 Type 2 diabetes mellitus with other specified complication: Secondary | ICD-10-CM

## 2020-07-27 LAB — BETA-HYDROXYBUTYRIC ACID: Beta-Hydroxybutyric Acid: 1.93 mmol/L — ABNORMAL HIGH (ref 0.05–0.27)

## 2020-07-27 MED ORDER — DEXCOM G6 SENSOR MISC: 1.0000 [IU] | 3 refills | Status: DC

## 2020-07-27 MED FILL — ATORVASTATIN CALCIUM 80 MG: 80 | 30 days supply | Qty: 30 | Fill #0

## 2020-07-31 ENCOUNTER — Other Ambulatory Visit: Payer: Self-pay

## 2020-07-31 LAB — GLUCOSE, CAPILLARY: Glucose-Capillary: 557 mg/dL (ref 70–99)

## 2020-07-31 MED ORDER — DEXCOM G6 SENSOR MISC: 1.0000 [IU] | 3 refills | Status: DC

## 2020-08-08 ENCOUNTER — Other Ambulatory Visit: Payer: Self-pay

## 2020-08-08 ENCOUNTER — Ambulatory Visit (INDEPENDENT_AMBULATORY_CARE_PROVIDER_SITE_OTHER): Payer: 59 | Admitting: Family Medicine

## 2020-08-08 ENCOUNTER — Encounter: Payer: Self-pay | Admitting: Family Medicine

## 2020-08-08 VITALS — BP 122/72 | HR 90 | Ht 70.5 in | Wt 249.8 lb

## 2020-08-08 DIAGNOSIS — Z794 Long term (current) use of insulin: Secondary | ICD-10-CM | POA: Diagnosis not present

## 2020-08-08 DIAGNOSIS — I10 Essential (primary) hypertension: Secondary | ICD-10-CM | POA: Diagnosis not present

## 2020-08-08 DIAGNOSIS — E1140 Type 2 diabetes mellitus with diabetic neuropathy, unspecified: Secondary | ICD-10-CM | POA: Diagnosis not present

## 2020-08-08 MED ORDER — INSULIN GLARGINE 100 UNIT/ML SOLOSTAR PEN: 100.0000 [IU] | PEN_INJECTOR | SUBCUTANEOUS | 0 refills | Status: DC

## 2020-08-08 MED ORDER — METOPROLOL SUCCINATE ER 50 MG PO TB24: 50.0000 mg | ORAL_TABLET | Freq: Every day | ORAL | 1 refills | Status: DC

## 2020-08-08 MED ORDER — INSULIN GLARGINE 100 UNIT/ML SOLOSTAR PEN: 100.0000 [IU] | PEN_INJECTOR | SUBCUTANEOUS | 11 refills | Status: DC

## 2020-08-08 NOTE — Progress Notes (Signed)
    SUBJECTIVE:   CHIEF COMPLAINT / HPI:   Hospital follow-up Patient reports he has been doing well since being hospitalized.  He was hospitalized for DKA at [facility].  Reports this was because he went to get his Lantus from the pharmacy and was told that it would be $200 and he could not afford it so he did not pick up his Lantus.  Reports that he was discharged with 70/30 insulin but does not like it and wants to continue with the Lantus.  Reports his sugars have been well controlled and showed his Dexcom which showed his average blood glucose has been 133 over the past month.  Requests a refill for his metoprolol 50 mg as well as his Lantus.  Would like to meet with Dr. Valentina Lucks to get all his medications straight.  Reports he is in good spirits now.  Of note per chart review he told the ED provider that he had not been taking his Lantus because he was depressed from losing his job.  Denied any suicidal or homicidal ideation.  OBJECTIVE:   BP 122/72   Pulse 90   Ht 5' 10.5" (1.791 m)   Wt 249 lb 12.8 oz (113.3 kg)   SpO2 94%   BMI 35.34 kg/m   General: Well-appearing, no acute distress, sitting comfortably in chair Cardiac: Regular rate and rhythm, no murmurs appreciated Respiratory: Normal work of breathing, lungs clear to auscultation bilaterally Abdomen: Soft, nontender, positive bowel sounds MSK: Ambulates well without difficulty Psych: In good spirits, denies SI or HI  PHQ9 SCORE ONLY 08/08/2020 07/03/2020 06/06/2020  PHQ-9 Total Score 10 10 13      ASSESSMENT/PLAN:   Type 2 diabetes mellitus with diabetic neuropathy, with long-term current use of insulin ([place]) Patient reports blood glucoses have been doing well since discharge from the hospital.  He was discharged on 70/30 insulin but reports that he found an extra pen and has been using it since his discharge.  Would like to continue with the Lantus.  Refill sent to his pharmacy and sample given to cover him until he can go  to the pharmacy. -Lantus 15 units daily -Continue Ozempic 0.25 mg weekly -Patient given sample of Lantus pen -Patient will see Dr. Valentina Lucks on 11/4 to discuss medication management and ensure all of his medications are correct -Follow-up in December for diabetes check     [name], MD [place]

## 2020-08-08 NOTE — Patient Instructions (Signed)
It was great to see you today.  I am sorry you are having these issues with your blood sugar medications.  I have given you a sample of the Lantus and have sent a refill to your pharmacy so that we can work it out with your insurance.  We have scheduled an appointment with Dr. Valentina Lucks for a medication reconciliation appointment on Thursday 11/4 at 3 PM.  At this appointment bring all your medications.  We are collecting some lab work today to verify that your labs are stable from when you were discharged from the hospital.  If you have any issues, questions, concerns please feel free to call our clinic.  I hope you have a wonderful afternoon!

## 2020-08-09 ENCOUNTER — Telehealth: Payer: Self-pay | Admitting: Family Medicine

## 2020-08-09 LAB — BASIC METABOLIC PANEL
BUN/Creatinine Ratio: 14 (ref 9–20)
BUN: 27 mg/dL — ABNORMAL HIGH (ref 6–24)
CO2: 31 mmol/L — ABNORMAL HIGH (ref 20–29)
Calcium: 9.4 mg/dL (ref 8.7–10.2)
Chloride: 82 mmol/L — ABNORMAL LOW (ref 96–106)
Creatinine, Ser: 1.95 mg/dL — ABNORMAL HIGH (ref 0.76–1.27)
GFR calc Af Amer: 43 mL/min/{1.73_m2} — ABNORMAL LOW (ref >59)
GFR calc non Af Amer: 37 mL/min/{1.73_m2} — ABNORMAL LOW (ref >59)
Glucose: 126 mg/dL — ABNORMAL HIGH (ref 65–99)
Potassium: 3.2 mmol/L — ABNORMAL LOW (ref 3.5–5.2)
Sodium: 136 mmol/L (ref 134–144)

## 2020-08-09 NOTE — Assessment & Plan Note (Signed)
Patient reports blood glucoses have been doing well since discharge from the hospital.  He was discharged on 70/30 insulin but reports that he found an extra pen and has been using it since his discharge.  Would like to continue with the Lantus.  Refill sent to his pharmacy and sample given to cover him until he can go to the pharmacy. -Lantus 15 units daily -Continue Ozempic 0.25 mg weekly -Patient given sample of Lantus pen -Patient will see Dr. Valentina Lucks on 11/4 to discuss medication management and ensure all of his medications are correct -Follow-up in December for diabetes check

## 2020-08-09 NOTE — Telephone Encounter (Signed)
Attempted to call patient regarding his lab results.  Patient has an AKI and I want to discuss what medications he is taking that could be causing the AKI.  Need to ensure he is not taking NSAIDs at this time and drinking plenty of fluids.  He will need a recheck BMP later this week or early next week.

## 2020-08-10 ENCOUNTER — Other Ambulatory Visit: Payer: Self-pay

## 2020-08-10 ENCOUNTER — Ambulatory Visit (INDEPENDENT_AMBULATORY_CARE_PROVIDER_SITE_OTHER): Payer: 59 | Admitting: Pharmacist

## 2020-08-10 ENCOUNTER — Encounter: Payer: Self-pay | Admitting: Pharmacist

## 2020-08-10 DIAGNOSIS — Z794 Long term (current) use of insulin: Secondary | ICD-10-CM

## 2020-08-10 DIAGNOSIS — E1165 Type 2 diabetes mellitus with hyperglycemia: Secondary | ICD-10-CM | POA: Diagnosis not present

## 2020-08-10 NOTE — Progress Notes (Signed)
S:     Chief Complaint  Patient presents with   Medication Management    Diabetes    Patient arrives in good spirits, ambulating without assistance.  Presents for diabetes evaluation, education, and management Patient was referred and last seen by Primary Care Provider (Dr. Caron Presume) on 08/08/20.   Patient denies hypoglycemic events.  He states that his Dexcom system alerts him when blood sugars are approaching low and able to prevent hypoglycemia with orange juice. He states his average blood sugar has been around 133.  Insurance coverage/medication affordability: [employer]  Medication adherence reported appears fair .   Current diabetes medications include: Ozempic 0.5mg  SQ once weekly, Fiasp 4-8 units three times daily with meals, metformin XR 500mg  BID, Farxiga 10mg  QD, Lantus 15 units daily.  Current hypertension medications include: amlodipine 10mg  QD, chlorthalidone 25mg  QD, metoprolol succinate 50mg  QD, torsemide 20mg  QD Current hyperlipidemia medications include: atorvastatin 80mg  QD   O:  Physical Exam Neurological:     Mental Status: He is alert.  Psychiatric:        Mood and Affect: Mood normal.        Behavior: Behavior normal.        Judgment: Judgment normal.      Review of Systems  All other systems reviewed and are negative.    Lab Results  Component Value Date   HGBA1C 8.8 (H) 07/23/2020   Vitals:   08/10/20 1555  BP: 138/78  Pulse: 78  SpO2: 90%    Lipid Panel     Component Value Date/Time   CHOL 158 01/20/2020 1708   TRIG 77 01/20/2020 1708   HDL 75 01/20/2020 1708   CHOLHDL 2.1 01/20/2020 1708   CHOLHDL 4.3 05/27/2016 1123   VLDL 58 (H) 05/27/2016 1123   LDLCALC 68 01/20/2020 1708   LDLDIRECT 89 12/23/2012 1414     Clinical Atherosclerotic Cardiovascular Disease (ASCVD): Yes  The 10-year ASCVD risk score Mikey Bussing DC Jr., et al., 2013) is: 19.7%   Values used to calculate the score:     Age: 54 years     Sex: Male     Is  Non-Hispanic African American: Yes     Diabetic: Yes     Tobacco smoker: No     Systolic Blood Pressure: 562 mmHg     Is BP treated: Yes     HDL Cholesterol: 75 mg/dL     Total Cholesterol: 158 mg/dL    A/P: Diabetes longstanding currently uncontrolled with most recent A1c 8.8%, however sugars have significantly improved averaging 130s. Patient is able to verbalize appropriate hypoglycemia management plan and drinks a cup of orange juice if his Dexcom alerts him of a low. Medication adherence appears fair. Has had difficulty in the past obtaining insulin due to cost.  Provided samples of basal insulin Lantus (insulin glargine) and Fiasp (insulin aspart w/ niacinamide). -Continued basal insulin Lantus (insulin glargine) 15 units daily  -Continued insulin Fiasp (insulin aspart w/ niacinamide) 4-8 units three times daily with meals.    -Continued GLP-1 Ozempic (semaglutide) 0.5mg  once weekly.  -Continued SGLT2-I Farxiga (dapagliflozin) 10mg  daily.  -Counseled on s/sx of and management of hypoglycemia -Discussed the importance of continuing insulin regimen and contacting pharmacy for more samples if he runs out. -Next A1C anticipated 09/2020.   Medication Samples have been provided to the patient.  Drug name: Lantus       Strength: 100 units/ml        Qty: 3  LOT: [id number]  Exp.Date: 11/06/2021  Dosing instructions: Inject 15 units SQ daily  Drug name: Fiasp         Strength: 100 units/ml        Qty: 2     LOT: [id number]  Exp.Date: 01/04/2022  Dosing instructions: Inject 4-8 units SQ TID w/meals  The patient has been instructed regarding the correct time, dose, and frequency of taking this medication, including desired effects and most common side effects.   ASCVD risk - secondary prevention in patient with diabetes.  High intensity statin indicated. Aspirin is indicated.  -Continued aspirin 81 mg  -Continued atorvastatin 80 mg.   Hypertension longstanding and currently near BP goal of  <130/80 mmHg, however patient reports home SBP sometimes ranges from 140-160s. Medication adherence reported as fair.  Encouraged patient to continue checking BP at home and bring readings to next visit with PCP to better assess BP.  -Continued amlodipine 10mg  daily, chlorthalidone 25mg  daily, and metoprolol succinate 50mg  daily.  Written patient instructions provided.  Total time in face to face counseling 30 minutes.   Follow up PCP 2 months.  Contact pharmacy clinic sooner if samples run out. Patient seen with Adria Dill, PharmD Candidate, Dimple Nanas, PharmD - PGY-1 Resident and Lorel Monaco, PharmD, PGY2 Pharmacy Resident.

## 2020-08-10 NOTE — Patient Instructions (Addendum)
It was nice to meet you today!  We provided you with samples of Fiasp and Lantus today.  Continue Ozempic 0.5 mg weekly, Fiasp 4-8 units three times daily with meals, and Lantus 15 units daily.  Please give [name] a call if you run out of these and we can provide you with more.    Bring your blood pressure at-home readings to your next visit.  We will review them with you.   Call back to make an appointment in January with Dr. Caron Presume.

## 2020-08-10 NOTE — Assessment & Plan Note (Signed)
Diabetes longstanding currently uncontrolled with most recent A1c 8.8%, however sugars have significantly improved averaging 130s. Patient is able to verbalize appropriate hypoglycemia management plan and drinks a cup of orange juice if his Dexcom alerts him of a low. Medication adherence appears fair. Has had difficulty in the past obtaining insulin due to cost.  Provided samples of basal insulin Lantus (insulin glargine) and Fiasp (insulin aspart w/ niacinamide). -Continued basal insulin Lantus (insulin glargine) 15 units daily  -Continued insulin Fiasp (insulin aspart w/ niacinamide) 4-8 units three times daily with meals.    -Continued GLP-1 Ozempic (semaglutide) 0.5mg  once weekly.  -Continued SGLT2-I Farxiga (dapagliflozin) 10mg  daily.  -Counseled on s/sx of and management of hypoglycemia -Discussed the importance of continuing insulin regimen and contacting pharmacy for more samples if he runs out. -Next A1C anticipated 09/2020.

## 2020-08-11 NOTE — Progress Notes (Signed)
Reviewed: I agree with Dr. Koval's documentation and management. 

## 2020-08-21 ENCOUNTER — Telehealth: Payer: Self-pay | Admitting: Family Medicine

## 2020-08-21 DIAGNOSIS — I10 Essential (primary) hypertension: Secondary | ICD-10-CM

## 2020-08-21 DIAGNOSIS — Z794 Long term (current) use of insulin: Secondary | ICD-10-CM

## 2020-08-21 DIAGNOSIS — N179 Acute kidney failure, unspecified: Secondary | ICD-10-CM

## 2020-08-21 DIAGNOSIS — E1165 Type 2 diabetes mellitus with hyperglycemia: Secondary | ICD-10-CM

## 2020-08-21 NOTE — Telephone Encounter (Signed)
Called patient regarding lab results.  I have attempted multiple times over the past week and have been unsuccessful at reaching him.  I did speak with him today and informed him that his kidney function was a little down at the previous check and I would like to recheck his labs.  He is agreeable to coming in for a lab draw.  Order placed for future BMP.  He says that he will come in tomorrow around 3 PM to get the labs drawn.  I will call him with those results when they come back.

## 2020-08-25 ENCOUNTER — Other Ambulatory Visit: Payer: 59

## 2020-08-25 ENCOUNTER — Other Ambulatory Visit: Payer: Self-pay

## 2020-08-25 DIAGNOSIS — Z794 Long term (current) use of insulin: Secondary | ICD-10-CM

## 2020-08-25 DIAGNOSIS — N179 Acute kidney failure, unspecified: Secondary | ICD-10-CM

## 2020-08-25 DIAGNOSIS — I10 Essential (primary) hypertension: Secondary | ICD-10-CM

## 2020-08-25 DIAGNOSIS — E1165 Type 2 diabetes mellitus with hyperglycemia: Secondary | ICD-10-CM

## 2020-08-26 LAB — BASIC METABOLIC PANEL
BUN/Creatinine Ratio: 15 (ref 9–20)
BUN: 22 mg/dL (ref 6–24)
CO2: 33 mmol/L — ABNORMAL HIGH (ref 20–29)
Calcium: 8.7 mg/dL (ref 8.7–10.2)
Chloride: 88 mmol/L — ABNORMAL LOW (ref 96–106)
Creatinine, Ser: 1.47 mg/dL — ABNORMAL HIGH (ref 0.76–1.27)
GFR calc Af Amer: 61 mL/min/{1.73_m2} (ref >59)
GFR calc non Af Amer: 53 mL/min/{1.73_m2} — ABNORMAL LOW (ref >59)
Glucose: 125 mg/dL — ABNORMAL HIGH (ref 65–99)
Potassium: 3 mmol/L — ABNORMAL LOW (ref 3.5–5.2)
Sodium: 140 mmol/L (ref 134–144)

## 2020-08-30 MED FILL — ATORVASTATIN CALCIUM 80 MG: 80 | 30 days supply | Qty: 30 | Fill #1

## 2020-09-05 ENCOUNTER — Other Ambulatory Visit: Payer: Self-pay

## 2020-09-05 ENCOUNTER — Encounter: Payer: Self-pay | Admitting: Family Medicine

## 2020-09-05 ENCOUNTER — Ambulatory Visit (INDEPENDENT_AMBULATORY_CARE_PROVIDER_SITE_OTHER): Payer: 59 | Admitting: Family Medicine

## 2020-09-05 DIAGNOSIS — E1165 Type 2 diabetes mellitus with hyperglycemia: Secondary | ICD-10-CM | POA: Diagnosis not present

## 2020-09-05 DIAGNOSIS — Z794 Long term (current) use of insulin: Secondary | ICD-10-CM | POA: Diagnosis not present

## 2020-09-05 NOTE — Assessment & Plan Note (Signed)
Patient has longstanding history of diabetes.  Most recent hemoglobin A1c was in October of this year and was 8.8.  It is too soon to recheck at this time.  Dexcom shows blood sugars average in the 129 region.  Patient understands appropriate hypoglycemia management.  Forms completed instructing his work that if he does have low blood sugars verified on his Dexcom he should drink some juice.  We adjusted his mealtime insulin to 4 units if blood sugars prior to eating or less than 260 units if they are greater than 200.  Forms completed for his work.  If he needs further documentation he knows that he can call and I will complete this for him. -Continue current long acting insulin regimen -Continue Ozempic -Continue Wilder Glade -Discussed the signs and symptoms of hypoglycemia and how to manage it -We will follow-up in January for follow-up A1c

## 2020-09-05 NOTE — Patient Instructions (Addendum)
It was great seeing you today.  I feel that your blood sugars are doing much better but I am concerned about the low blood sugars.  I would like to decrease your mealtime insulin to 4 units if your blood sugars are less than 200.  Take 6 units of short acting if the blood sugars are greater than 200.  Be sure that you eat a full meal.  If you were notified that your blood sugar is lower than 80 I recommend getting something with sugar including juice to help raise the blood sugar.  We will recheck a hemoglobin A1c after 10/23/2020 and can make further adjustments to your insulin at that time.

## 2020-09-05 NOTE — Progress Notes (Signed)
    SUBJECTIVE:   CHIEF COMPLAINT / HPI:   Diabetes concerns Patient reports that he is here because of issues at work with his diabetes.  Over the last year he has had 3 hypoglycemic events with the lowest blood sugars in the 40s.  He now has a Dexcom which allows him to monitor his blood glucoses better but is office needs forms completed for plans in case of hypoglycemia.  His Dexcom shows that his average blood glucose over the last 9 days has been 129 with 4% lows which is less than 50.  He reports that this occurs when he takes his morning insulin and then takes his mealtime breakfast insulin.  He usually takes 6 units if his blood sugars are in the 130s to 140s and 8 units if his blood sugars are in the 180s.   PERTINENT  PMH / PSH: Type 2 diabetes  OBJECTIVE:   BP 128/82   Pulse (!) 104   Ht 5' 9.5" (1.765 m)   Wt 256 lb 9.6 oz (116.4 kg)   SpO2 95%   BMI 37.35 kg/m   General: Well-appearing, no acute distress, sitting comfortably in chair next to exam table Cardiac: Regular rate and rhythm, no murmurs appreciated Respiratory: Normal work of breathing, lungs clear to auscultation bilaterally Abdomen: Soft, nontender, positive bowel sounds MSK: Patient ambulates without difficulty  ASSESSMENT/PLAN:   Type 2 diabetes mellitus with hyperglycemia, with long-term current use of insulin ([place]) Patient has longstanding history of diabetes.  Most recent hemoglobin A1c was in October of this year and was 8.8.  It is too soon to recheck at this time.  Dexcom shows blood sugars average in the 129 region.  Patient understands appropriate hypoglycemia management.  Forms completed instructing his work that if he does have low blood sugars verified on his Dexcom he should drink some juice.  We adjusted his mealtime insulin to 4 units if blood sugars prior to eating or less than 260 units if they are greater than 200.  Forms completed for his work.  If he needs further documentation he knows  that he can call and I will complete this for him. -Continue current long acting insulin regimen -Continue Ozempic -Continue Wilder Glade -Discussed the signs and symptoms of hypoglycemia and how to manage it -We will follow-up in January for follow-up A1c     [name], MD [place]

## 2020-09-06 MED FILL — DIVALPROEX SOD ER 500 MG TA: 500 | 30 days supply | Qty: 60 | Fill #1

## 2020-09-12 ENCOUNTER — Other Ambulatory Visit: Payer: Self-pay | Admitting: *Deleted

## 2020-09-12 MED ORDER — GABAPENTIN 300 MG PO CAPS
900.0000 mg | ORAL_CAPSULE | Freq: Three times a day (TID) | ORAL | 0 refills | Status: DC
Start: 2020-09-12 — End: 2020-11-04

## 2020-09-26 ENCOUNTER — Other Ambulatory Visit: Payer: Self-pay

## 2020-09-26 MED ORDER — DEXCOM G6 SENSOR MISC
1.0000 [IU] | 3 refills | Status: DC
Start: 2020-09-26 — End: 2021-09-17

## 2020-09-28 MED FILL — DIVALPROEX SOD ER 500 MG TA: 500 | 30 days supply | Qty: 60 | Fill #1

## 2020-09-28 MED FILL — ATORVASTATIN CALCIUM 80 MG: 80 | 30 days supply | Qty: 30 | Fill #1

## 2020-10-04 ENCOUNTER — Other Ambulatory Visit: Payer: Self-pay

## 2020-10-05 MED ORDER — DEXCOM G6 TRANSMITTER MISC: 3 refills | Status: DC

## 2020-10-10 ENCOUNTER — Other Ambulatory Visit: Payer: Self-pay

## 2020-10-16 ENCOUNTER — Other Ambulatory Visit: Payer: Self-pay | Admitting: *Deleted

## 2020-10-16 MED ORDER — POTASSIUM CHLORIDE ER 20 MEQ PO TBCR: 20.0000 meq | EXTENDED_RELEASE_TABLET | Freq: Every day | ORAL | 1 refills | Status: DC

## 2020-10-31 ENCOUNTER — Encounter (HOSPITAL_COMMUNITY): Payer: Self-pay | Admitting: Emergency Medicine

## 2020-10-31 ENCOUNTER — Inpatient Hospital Stay (HOSPITAL_COMMUNITY)
Admission: EM | Admit: 2020-10-31 | Discharge: 2020-11-04 | DRG: 377 | Disposition: A | Discharge status: Home / Self Care | Payer: 59 | Attending: Family Medicine | Admitting: Family Medicine

## 2020-10-31 ENCOUNTER — Other Ambulatory Visit: Payer: Self-pay

## 2020-10-31 ENCOUNTER — Emergency Department (HOSPITAL_COMMUNITY): Payer: 59

## 2020-10-31 DIAGNOSIS — N1831 Chronic kidney disease, stage 3a: Secondary | ICD-10-CM | POA: Diagnosis present

## 2020-10-31 DIAGNOSIS — K920 Hematemesis: Secondary | ICD-10-CM

## 2020-10-31 DIAGNOSIS — N529 Male erectile dysfunction, unspecified: Secondary | ICD-10-CM | POA: Diagnosis present

## 2020-10-31 DIAGNOSIS — K7030 Alcoholic cirrhosis of liver without ascites: Secondary | ICD-10-CM | POA: Diagnosis present

## 2020-10-31 DIAGNOSIS — R791 Abnormal coagulation profile: Secondary | ICD-10-CM | POA: Diagnosis present

## 2020-10-31 DIAGNOSIS — F10129 Alcohol abuse with intoxication, unspecified: Secondary | ICD-10-CM

## 2020-10-31 DIAGNOSIS — K219 Gastro-esophageal reflux disease without esophagitis: Secondary | ICD-10-CM | POA: Diagnosis present

## 2020-10-31 DIAGNOSIS — E1110 Type 2 diabetes mellitus with ketoacidosis without coma: Secondary | ICD-10-CM | POA: Diagnosis present

## 2020-10-31 DIAGNOSIS — K2970 Gastritis, unspecified, without bleeding: Secondary | ICD-10-CM | POA: Diagnosis present

## 2020-10-31 DIAGNOSIS — E876 Hypokalemia: Secondary | ICD-10-CM | POA: Diagnosis present

## 2020-10-31 DIAGNOSIS — G40909 Epilepsy, unspecified, not intractable, without status epilepticus: Secondary | ICD-10-CM | POA: Diagnosis present

## 2020-10-31 DIAGNOSIS — Y907 Blood alcohol level of 200-239 mg/100 ml: Secondary | ICD-10-CM | POA: Diagnosis present

## 2020-10-31 DIAGNOSIS — F32A Depression, unspecified: Secondary | ICD-10-CM | POA: Diagnosis present

## 2020-10-31 DIAGNOSIS — Z6832 Body mass index (BMI) 32.0-32.9, adult: Secondary | ICD-10-CM

## 2020-10-31 DIAGNOSIS — Z794 Long term (current) use of insulin: Secondary | ICD-10-CM

## 2020-10-31 DIAGNOSIS — K2081 Other esophagitis with bleeding: Secondary | ICD-10-CM | POA: Diagnosis present

## 2020-10-31 DIAGNOSIS — E1140 Type 2 diabetes mellitus with diabetic neuropathy, unspecified: Secondary | ICD-10-CM

## 2020-10-31 DIAGNOSIS — Z888 Allergy status to other drugs, medicaments and biological substances status: Secondary | ICD-10-CM

## 2020-10-31 DIAGNOSIS — K264 Chronic or unspecified duodenal ulcer with hemorrhage: Principal | ICD-10-CM

## 2020-10-31 DIAGNOSIS — K922 Gastrointestinal hemorrhage, unspecified: Secondary | ICD-10-CM | POA: Diagnosis present

## 2020-10-31 DIAGNOSIS — K2980 Duodenitis without bleeding: Secondary | ICD-10-CM | POA: Diagnosis present

## 2020-10-31 DIAGNOSIS — F1020 Alcohol dependence, uncomplicated: Secondary | ICD-10-CM | POA: Diagnosis present

## 2020-10-31 DIAGNOSIS — D62 Acute posthemorrhagic anemia: Secondary | ICD-10-CM

## 2020-10-31 DIAGNOSIS — R6510 Systemic inflammatory response syndrome (SIRS) of non-infectious origin without acute organ dysfunction: Secondary | ICD-10-CM | POA: Diagnosis present

## 2020-10-31 DIAGNOSIS — Z7982 Long term (current) use of aspirin: Secondary | ICD-10-CM

## 2020-10-31 DIAGNOSIS — R531 Weakness: Secondary | ICD-10-CM | POA: Diagnosis not present

## 2020-10-31 DIAGNOSIS — F419 Anxiety disorder, unspecified: Secondary | ICD-10-CM | POA: Diagnosis present

## 2020-10-31 DIAGNOSIS — E1169 Type 2 diabetes mellitus with other specified complication: Secondary | ICD-10-CM | POA: Diagnosis present

## 2020-10-31 DIAGNOSIS — I129 Hypertensive chronic kidney disease with stage 1 through stage 4 chronic kidney disease, or unspecified chronic kidney disease: Secondary | ICD-10-CM | POA: Diagnosis present

## 2020-10-31 DIAGNOSIS — K7460 Unspecified cirrhosis of liver: Secondary | ICD-10-CM | POA: Diagnosis not present

## 2020-10-31 DIAGNOSIS — K921 Melena: Secondary | ICD-10-CM

## 2020-10-31 DIAGNOSIS — F17290 Nicotine dependence, other tobacco product, uncomplicated: Secondary | ICD-10-CM | POA: Diagnosis present

## 2020-10-31 DIAGNOSIS — Z9114 Patient's other noncompliance with medication regimen: Secondary | ICD-10-CM

## 2020-10-31 DIAGNOSIS — R739 Hyperglycemia, unspecified: Secondary | ICD-10-CM

## 2020-10-31 DIAGNOSIS — D72829 Elevated white blood cell count, unspecified: Secondary | ICD-10-CM | POA: Diagnosis present

## 2020-10-31 DIAGNOSIS — Y92009 Unspecified place in unspecified non-institutional (private) residence as the place of occurrence of the external cause: Secondary | ICD-10-CM

## 2020-10-31 DIAGNOSIS — K319 Disease of stomach and duodenum, unspecified: Secondary | ICD-10-CM | POA: Diagnosis present

## 2020-10-31 DIAGNOSIS — K760 Fatty (change of) liver, not elsewhere classified: Secondary | ICD-10-CM | POA: Diagnosis present

## 2020-10-31 DIAGNOSIS — W06XXXA Fall from bed, initial encounter: Secondary | ICD-10-CM | POA: Diagnosis present

## 2020-10-31 DIAGNOSIS — E871 Hypo-osmolality and hyponatremia: Secondary | ICD-10-CM | POA: Diagnosis present

## 2020-10-31 DIAGNOSIS — E785 Hyperlipidemia, unspecified: Secondary | ICD-10-CM | POA: Diagnosis present

## 2020-10-31 DIAGNOSIS — K860 Alcohol-induced chronic pancreatitis: Secondary | ICD-10-CM | POA: Diagnosis present

## 2020-10-31 DIAGNOSIS — E669 Obesity, unspecified: Secondary | ICD-10-CM | POA: Diagnosis present

## 2020-10-31 DIAGNOSIS — F1010 Alcohol abuse, uncomplicated: Secondary | ICD-10-CM | POA: Diagnosis present

## 2020-10-31 DIAGNOSIS — G4733 Obstructive sleep apnea (adult) (pediatric): Secondary | ICD-10-CM | POA: Diagnosis present

## 2020-10-31 DIAGNOSIS — Z8674 Personal history of sudden cardiac arrest: Secondary | ICD-10-CM

## 2020-10-31 DIAGNOSIS — E1122 Type 2 diabetes mellitus with diabetic chronic kidney disease: Secondary | ICD-10-CM | POA: Diagnosis present

## 2020-10-31 DIAGNOSIS — Z20822 Contact with and (suspected) exposure to covid-19: Secondary | ICD-10-CM | POA: Diagnosis present

## 2020-10-31 DIAGNOSIS — Z833 Family history of diabetes mellitus: Secondary | ICD-10-CM

## 2020-10-31 DIAGNOSIS — Z79899 Other long term (current) drug therapy: Secondary | ICD-10-CM

## 2020-10-31 DIAGNOSIS — G9341 Metabolic encephalopathy: Secondary | ICD-10-CM | POA: Diagnosis present

## 2020-10-31 LAB — URINALYSIS, ROUTINE W REFLEX MICROSCOPIC
Bacteria, UA: NONE SEEN
Bilirubin Urine: NEGATIVE
Glucose, UA: >=500 mg/dL — AB
Ketones, ur: 20 mg/dL — AB
Leukocytes,Ua: NEGATIVE
Nitrite: NEGATIVE
Protein, ur: NEGATIVE mg/dL
Specific Gravity, Urine: 1.018 (ref 1.005–1.030)
pH: 6 (ref 5.0–8.0)

## 2020-10-31 LAB — COMPREHENSIVE METABOLIC PANEL
ALT: 38 U/L (ref 0–44)
AST: 50 U/L — ABNORMAL HIGH (ref 15–41)
Albumin: 2 g/dL — ABNORMAL LOW (ref 3.5–5.0)
Alkaline Phosphatase: 192 U/L — ABNORMAL HIGH (ref 38–126)
Anion gap: 26 — ABNORMAL HIGH (ref 5–15)
BUN: 19 mg/dL (ref 6–20)
CO2: 24 mmol/L (ref 22–32)
Calcium: 7.7 mg/dL — ABNORMAL LOW (ref 8.9–10.3)
Chloride: 82 mmol/L — ABNORMAL LOW (ref 98–111)
Creatinine, Ser: 1.53 mg/dL — ABNORMAL HIGH (ref 0.61–1.24)
GFR, Estimated: 53 mL/min — ABNORMAL LOW (ref >60)
Glucose, Bld: 465 mg/dL — ABNORMAL HIGH (ref 70–99)
Potassium: 3.4 mmol/L — ABNORMAL LOW (ref 3.5–5.1)
Sodium: 132 mmol/L — ABNORMAL LOW (ref 135–145)
Total Bilirubin: 1.1 mg/dL (ref 0.3–1.2)
Total Protein: 6.2 g/dL — ABNORMAL LOW (ref 6.5–8.1)

## 2020-10-31 LAB — I-STAT VENOUS BLOOD GAS, ED
Acid-Base Excess: 6 mmol/L — ABNORMAL HIGH (ref 0.0–2.0)
Bicarbonate: 28.6 mmol/L — ABNORMAL HIGH (ref 20.0–28.0)
Calcium, Ion: 0.77 mmol/L — CL (ref 1.15–1.40)
HCT: 42 % (ref 39.0–52.0)
Hemoglobin: 14.3 g/dL (ref 13.0–17.0)
O2 Saturation: 98 %
Potassium: 3.3 mmol/L — ABNORMAL LOW (ref 3.5–5.1)
Sodium: 129 mmol/L — ABNORMAL LOW (ref 135–145)
TCO2: 30 mmol/L (ref 22–32)
pCO2, Ven: 35.7 mmHg — ABNORMAL LOW (ref 44.0–60.0)
pH, Ven: 7.511 — ABNORMAL HIGH (ref 7.250–7.430)
pO2, Ven: 94 mmHg — ABNORMAL HIGH (ref 32.0–45.0)

## 2020-10-31 LAB — CBC WITH DIFFERENTIAL/PLATELET
Abs Immature Granulocytes: 0.21 10*3/uL — ABNORMAL HIGH (ref 0.00–0.07)
Basophils Absolute: 0.1 10*3/uL (ref 0.0–0.1)
Basophils Relative: 0 %
Eosinophils Absolute: 0 10*3/uL (ref 0.0–0.5)
Eosinophils Relative: 0 %
HCT: 38.3 % — ABNORMAL LOW (ref 39.0–52.0)
Hemoglobin: 12.4 g/dL — ABNORMAL LOW (ref 13.0–17.0)
Immature Granulocytes: 1 %
Lymphocytes Relative: 15 %
Lymphs Abs: 2.5 10*3/uL (ref 0.7–4.0)
MCH: 30.6 pg (ref 26.0–34.0)
MCHC: 32.4 g/dL (ref 30.0–36.0)
MCV: 94.6 fL (ref 80.0–100.0)
Monocytes Absolute: 1.2 10*3/uL — ABNORMAL HIGH (ref 0.1–1.0)
Monocytes Relative: 7 %
Neutro Abs: 13 10*3/uL — ABNORMAL HIGH (ref 1.7–7.7)
Neutrophils Relative %: 77 %
Platelets: 387 10*3/uL (ref 150–400)
RBC: 4.05 MIL/uL — ABNORMAL LOW (ref 4.22–5.81)
RDW: 16.9 % — ABNORMAL HIGH (ref 11.5–15.5)
WBC: 17 10*3/uL — ABNORMAL HIGH (ref 4.0–10.5)
nRBC: 0 % (ref 0.0–0.2)

## 2020-10-31 LAB — PROTIME-INR
INR: 2 — ABNORMAL HIGH (ref 0.8–1.2)
Prothrombin Time: 21.8 seconds — ABNORMAL HIGH (ref 11.4–15.2)

## 2020-10-31 LAB — CBG MONITORING, ED
Glucose-Capillary: 417 mg/dL — ABNORMAL HIGH (ref 70–99)
Glucose-Capillary: 309 mg/dL — ABNORMAL HIGH (ref 70–99)
Glucose-Capillary: 188 mg/dL — ABNORMAL HIGH (ref 70–99)
Glucose-Capillary: 158 mg/dL — ABNORMAL HIGH (ref 70–99)
Glucose-Capillary: 133 mg/dL — ABNORMAL HIGH (ref 70–99)

## 2020-10-31 LAB — BASIC METABOLIC PANEL
Anion gap: 25 — ABNORMAL HIGH (ref 5–15)
Anion gap: 19 — ABNORMAL HIGH (ref 5–15)
BUN: 20 mg/dL (ref 6–20)
BUN: 16 mg/dL (ref 6–20)
CO2: 23 mmol/L (ref 22–32)
CO2: 28 mmol/L (ref 22–32)
Calcium: 7.4 mg/dL — ABNORMAL LOW (ref 8.9–10.3)
Calcium: 7.3 mg/dL — ABNORMAL LOW (ref 8.9–10.3)
Chloride: 85 mmol/L — ABNORMAL LOW (ref 98–111)
Chloride: 89 mmol/L — ABNORMAL LOW (ref 98–111)
Creatinine, Ser: 1.42 mg/dL — ABNORMAL HIGH (ref 0.61–1.24)
Creatinine, Ser: 1.18 mg/dL (ref 0.61–1.24)
GFR, Estimated: 58 mL/min — ABNORMAL LOW (ref >60)
GFR, Estimated: >60 mL/min (ref >60)
Glucose, Bld: 434 mg/dL — ABNORMAL HIGH (ref 70–99)
Glucose, Bld: 199 mg/dL — ABNORMAL HIGH (ref 70–99)
Potassium: 3 mmol/L — ABNORMAL LOW (ref 3.5–5.1)
Potassium: 2.7 mmol/L — CL (ref 3.5–5.1)
Sodium: 133 mmol/L — ABNORMAL LOW (ref 135–145)
Sodium: 136 mmol/L (ref 135–145)

## 2020-10-31 LAB — MAGNESIUM: Magnesium: 1.6 mg/dL — ABNORMAL LOW (ref 1.7–2.4)

## 2020-10-31 LAB — APTT: aPTT: 33 seconds (ref 24–36)

## 2020-10-31 LAB — PHOSPHORUS: Phosphorus: 1.1 mg/dL — ABNORMAL LOW (ref 2.5–4.6)

## 2020-10-31 LAB — LACTIC ACID, PLASMA: Lactic Acid, Venous: 4 mmol/L (ref 0.5–1.9)

## 2020-10-31 LAB — BETA-HYDROXYBUTYRIC ACID: Beta-Hydroxybutyric Acid: 6.76 mmol/L — ABNORMAL HIGH (ref 0.05–0.27)

## 2020-10-31 LAB — ETHANOL: Alcohol, Ethyl (B): 234 mg/dL — ABNORMAL HIGH (ref <10)

## 2020-10-31 LAB — TYPE AND SCREEN
ABO/RH(D): O POS
Antibody Screen: NEGATIVE

## 2020-10-31 LAB — SARS CORONAVIRUS 2 (TAT 6-24 HRS): SARS Coronavirus 2: NEGATIVE

## 2020-10-31 LAB — POC OCCULT BLOOD, ED: Fecal Occult Bld: POSITIVE — AB

## 2020-10-31 LAB — AMMONIA: Ammonia: 34 umol/L (ref 9–35)

## 2020-10-31 LAB — OSMOLALITY: Osmolality: 357 mOsm/kg (ref 275–295)

## 2020-10-31 MED ORDER — LORAZEPAM 1 MG PO TABS: 1.0000 mg | ORAL_TABLET | ORAL | Status: AC | PRN

## 2020-10-31 MED ORDER — DEXTROSE 50 % IV SOLN: 0.0000 mL | INTRAVENOUS | Status: DC | PRN

## 2020-10-31 MED ORDER — THIAMINE HCL 100 MG/ML IJ SOLN
100.0000 mg | Freq: Every day | INTRAMUSCULAR | Status: DC
  Administered 2020-10-31 – 2020-11-02 (×3): 100 mg via INTRAVENOUS
  Filled 2020-10-31 (×3): qty 2

## 2020-10-31 MED ORDER — DEXTROSE IN LACTATED RINGERS 5 % IV SOLN: INTRAVENOUS | Status: DC

## 2020-10-31 MED ORDER — LORAZEPAM 2 MG/ML IJ SOLN: 1.0000 mg | INTRAMUSCULAR | Status: AC | PRN

## 2020-10-31 MED ORDER — PHYTONADIONE 5 MG PO TABS
10.0000 mg | ORAL_TABLET | Freq: Every day | ORAL | Status: AC
  Administered 2020-10-31: 10 mg via ORAL
  Filled 2020-10-31 (×2): qty 2

## 2020-10-31 MED ORDER — SODIUM CHLORIDE 0.9 % IV SOLN
50.0000 ug/h | INTRAVENOUS | Status: DC
  Administered 2020-10-31 – 2020-11-01 (×2): 50 ug/h via INTRAVENOUS
  Filled 2020-10-31 (×2): qty 1

## 2020-10-31 MED ORDER — FOLIC ACID 1 MG PO TABS
1.0000 mg | ORAL_TABLET | Freq: Every day | ORAL | Status: DC
  Administered 2020-11-01 – 2020-11-04 (×4): 1 mg via ORAL
  Filled 2020-10-31 (×4): qty 1

## 2020-10-31 MED ORDER — ADULT MULTIVITAMIN W/MINERALS CH
1.0000 | ORAL_TABLET | Freq: Every day | ORAL | Status: DC
  Administered 2020-10-31 – 2020-11-04 (×5): 1 via ORAL
  Filled 2020-10-31 (×5): qty 1

## 2020-10-31 MED ORDER — POTASSIUM CHLORIDE 10 MEQ/100ML IV SOLN
10.0000 meq | INTRAVENOUS | Status: AC
  Administered 2020-10-31 – 2020-11-01 (×6): 10 meq via INTRAVENOUS
  Filled 2020-10-31 (×5): qty 100

## 2020-10-31 MED ORDER — INSULIN REGULAR(HUMAN) IN NACL 100-0.9 UT/100ML-% IV SOLN
INTRAVENOUS | Status: DC
  Administered 2020-10-31: 6.5 [IU]/h via INTRAVENOUS
  Filled 2020-10-31: qty 100

## 2020-10-31 MED ORDER — OCTREOTIDE LOAD VIA INFUSION
50.0000 ug | Freq: Once | INTRAVENOUS | Status: AC
  Administered 2020-10-31: 50 ug via INTRAVENOUS
  Filled 2020-10-31: qty 25

## 2020-10-31 MED ORDER — LACTATED RINGERS IV SOLN: INTRAVENOUS | Status: DC

## 2020-10-31 MED ORDER — LACTATED RINGERS IV BOLUS
1000.0000 mL | Freq: Once | INTRAVENOUS | Status: AC
  Administered 2020-10-31: 1000 mL via INTRAVENOUS

## 2020-10-31 MED ORDER — SODIUM CHLORIDE 0.9 % IV SOLN
80.0000 mg | Freq: Two times a day (BID) | INTRAVENOUS | Status: DC
  Administered 2020-10-31 – 2020-11-01 (×2): 80 mg via INTRAVENOUS
  Filled 2020-10-31 (×3): qty 80

## 2020-10-31 MED ORDER — POTASSIUM CHLORIDE 10 MEQ/100ML IV SOLN
10.0000 meq | INTRAVENOUS | Status: AC
  Administered 2020-10-31 (×2): 10 meq via INTRAVENOUS
  Filled 2020-10-31 (×2): qty 100

## 2020-10-31 MED ORDER — CEFTRIAXONE SODIUM 1 G IJ SOLR
1.0000 g | INTRAMUSCULAR | Status: DC
Start: 2020-10-31 — End: 2020-11-02
  Administered 2020-10-31 – 2020-11-01 (×2): 1 g via INTRAVENOUS
  Filled 2020-10-31 (×3): qty 10

## 2020-10-31 MED ORDER — PANTOPRAZOLE SODIUM 40 MG IV SOLR
40.0000 mg | Freq: Once | INTRAVENOUS | Status: AC
  Administered 2020-10-31: 40 mg via INTRAVENOUS
  Filled 2020-10-31: qty 40

## 2020-10-31 MED ORDER — LACTATED RINGERS IV BOLUS
20.0000 mL/kg | Freq: Once | INTRAVENOUS | Status: AC
  Administered 2020-10-31: 2032 mL via INTRAVENOUS

## 2020-10-31 NOTE — ED Triage Notes (Addendum)
Pt arrives to ED via [facility] with c/o x4 days of weakness, lethargy, coffee-ground emesis and melena. Per EMS pt had an unwitnessed fall today and now is altered. When EMS arrived pts blood sugar read high. Pt alert to self and place, however disoriented to situation and time.

## 2020-10-31 NOTE — H&P (Addendum)
[facility] Admission History and Physical Service Pager: [phone]  Patient name: Jeffrey [name] Medical record number: [id number] Date of birth: Nov 01, 1965 Age: 55 y.o. Gender: male  Primary Care Provider: [name], MD Consultants: GI Code Status: Full  Preferred Emergency Contact: Fontaine No (wife)  Chief Complaint: Dark stools  Assessment and Plan: Jeffrey [name] is a 55 y.o. male presenting with ~5 days of hematemesis and hematochezia. PMH is significant for cirrhosis, alcohol use disorder, T2DM, anoxic brain injury s/p cardiac arrest, HTN, HLD, OSA, CKD, seizures.  GI Bleed  Hx of Cirrhosis Patient presents with 5 days of dark stools and dark emesis.  Also reports weakness and inability to get out of bed resulting in a fall earlier today. In the ED patient is afebrile, normotensive, but tachycardic to the 110s. FOBT positive. Lab work remarkable for hemoglobin 12.4, WBC 17, platelets 387, lactic acid 4.0, INR elevated at 2.0, alcohol level of 234, AST 50, alk phos 192.  Was also found to have markedly elevated glucose (465), with anion gap 26, potassium 3.4. Chest x-ray and CT head unremarkable. Patient has a known history of cirrhosis, not followed by GI to his knowledge. Per chart review, he was referred for colonoscopy 05/2020 (don't think it was completed), no documented varices.  His MELD score is 22 (19.6% three month mortality). Home meds include: omeprazole 54m daily although patient states he has not taken any of his meds in at least 1 week.  On exam, patient has no right upper quadrant tenderness, negative jaundice, mild ascites.  Patient likely has upper GI bleed related to alcohol consumption.  Fortunately, he is hemodynamically stable currently.  Due to poor p.o. intake, his hemoglobin is likely falsely concentrated and would expected to decrease with IV fluids. -admit to progressive, attending Dr. BOwens Shark-vitals per routine -OOB with  assistance only -S/p 40 mg IV Protonix in the ED -Will start IV Protonix 80 mg twice daily -Will start ceftriaxone 1 g daily PPx -Consult GI, appreciate recommendations  -spoke with on-call provider who recommends Octreotide 566m bolus followed by 5057mhr gtt, 29m90mt K daily x3 days, and NPO -Zofran IV as needed  T2DM  Concern for DKA Patient's glucose found to be 465 on arrival. States he has not taken his insulin in one week and has not used his continuous glucose monitor. Blood gas shows pH 7.5 which is less consistent with DKA, but BHB is significantly elevated at 6.76 and anion gap of 26.  pH likely related to severe vomiting. Urine studies pending. Home medications include lantus 20u daily, ozempic, metformin, meal coverage sliding scale, and farxiga. Also on atorvastatin 80 mg daily. -Currently on insulin gtt per Endo tool -S/p LR bolus x2, 10 mEq IV potassium x2 in the ED -Continue IV fluids per Endo tool (D5LR at 125 mL/h) -Monitor BMP closely due to hypokalemia -Potassium repletion 10 mEq IV x6 -Discontinue IV fluids when anion gap closes  Alcohol Use Disorder Ethanol level 234 in the ED.  Patient states he drinks two 12 ounce beers daily and his last drink was Friday 1/21. When informed his alcohol level was significantly elevated, he states he may have drank more than that.  He does endorse history of prior withdrawal unclear whether he has ever required ICU. -CIWA monitoring with Ativan -Thiamine, folic acid  CKD Creatinine 1.53 on arrival with estimated GFR 53.  Patient's baseline seems to be ~1.4. -Monitor CMP -IV fluids for management of DKA as above  HTN Patient with a history of hypertension, on amlodipine 10 mg daily, chlorthalidone 25 mg daily, and metoprolol 50 mg daily at home. Patient has been normotensive since arrival, most recent BP 128/94. -Monitor BP -Currently NPO, plan to continue home meds starting tomorrow  Seizures Patient with a history of  seizures, unclear whether these are related to alcohol withdrawal.  Home meds include Depakote 1000 mg daily per chart review -Continue home Depakote pending formal med rec -Management of alcohol withdrawal as above  Depression Home meds: Mirtazapine per chart review. -Formal med rec pending   FEN/GI: NPO Prophylaxis: SCDs (active bleed)  Disposition: Progressive  History of Present Illness:  Jeffrey [name] is a 55 y.o. male presenting with  5-6 days of black stools.  Reports 1-2 bowel movements per day which are dark in color. He also reports dark emesis for 5-6 days and states he has had up to 10 episodes of emesis per day.  Patient repeatedly states he has been feeling unwell but is unable to characterize exactly how he has been feeling.  Endorses worsening weakness, and inability to get out of bed resulting in a fall earlier today.  He recalls having dark stools in 2014 and was hospitalized for this.  Per patient he later had his cardiac arrest as a result.  Does not follow with GI to his knowledge.  Patient states he has not taken his meds in at least 1 week.  He is unsure whether he stopped taking his meds and then became ill or whether he stopped taking his meds because he was not feeling well.   At baseline patient lives at home with his wife and children.  Performs all of his ADLs and IADLs. He endorses alcohol use-he says last drink was Friday, 1/21. Has h/o withdrawal symptoms requiring ICU stay and seizures.   Review Of Systems: Per HPI with the following additions:  Review of Systems  Constitutional: Positive for chills and fever (has not checked temperature).  HENT: Negative for congestion.   Respiratory: Positive for shortness of breath (believes this is due to missing medications). Negative for cough.   Gastrointestinal: Positive for diarrhea, nausea and vomiting (x5 days and dark color like stool. up to 10 times per day). Negative for abdominal pain.  Neurological:  Positive for dizziness. Negative for headaches.     Patient Active Problem List   Diagnosis Date Noted  . DKA (diabetic ketoacidosis) ([place]) 07/22/2020  . Candidiasis 07/22/2020  . Hyponatremia 06/28/2020  . Eyebrow laceration, left, initial encounter   . Hypokalemia   . Hyperlipidemia associated with type 2 diabetes mellitus ([place])   . MGUS (monoclonal gammopathy of unknown significance) 05/31/2020  . Seizures ([place]) 03/16/2020  . Type 2 diabetes mellitus with hyperglycemia, with long-term current use of insulin ([place]) 03/16/2020  . Tremor 03/16/2020  . Colon cancer screening 02/22/2020  . Stage 3a chronic kidney disease ([place]) 01/29/2020  . Hyperphosphatemia 01/29/2020  . Need for immunization against influenza 06/04/2019  . Major depressive disorder, recurrent episode ([place]) 01/13/2018  . Seizure ([place]) 04/08/2017  . Proteinuria 11/23/2015  . Essential hypertension   . Hepatic cirrhosis ([place]) 03/22/2015  . Ataxia 03/20/2015  . Dysarthria 03/09/2015  . ETOH abuse   . Hyperkalemia   . Tachycardia   . Erosive esophagitis 10/10/2014  . Acute kidney injury ([place])   . OSA on CPAP 12/28/2012  . Cardiac arrest ([place]) 12/07/2012  . Anoxic brain injury ([place]) 12/07/2012  . ERECTILE DYSFUNCTION 04/14/2007  . Type  2 diabetes mellitus with diabetic neuropathy, with long-term current use of insulin ([place]) 12/04/2006  . HYPERCHOLESTEROLEMIA 12/04/2006    Past Medical History: Past Medical History:  Diagnosis Date  . Acute renal insufficiency 12/08/2012  . AKI (acute kidney injury) ([place])   . ALCOHOL ABUSE, HX OF 11/10/2007  . Anoxic encephalopathy ([place])   . Anxiety   . Bleeding ulcer 2014  . Blood transfusion 2014   "related to bleeding ulcer"  . Cardiac arrest ([place]) 12/07/2012   Anoxic encephalopathy  . GERD (gastroesophageal reflux disease)   . High cholesterol   . Hypertension   . OSA on CPAP 12/07/2012  . Seizure-like activity ([place])   . Type II diabetes mellitus ([place])   . Venous  insufficiency 12/13/2011    Past Surgical History: Past Surgical History:  Procedure Laterality Date  . CARDIOVASCULAR STRESS TEST  03/16/13   Very Poor Exercise Tolerance; NON DIAGNOSTIC TEST  . CATARACT EXTRACTION W/ INTRAOCULAR LENS IMPLANT Left 03/07/2014   [place] @ [facility]  . ESOPHAGOGASTRODUODENOSCOPY Left 11/26/2012   Procedure: ESOPHAGOGASTRODUODENOSCOPY (EGD);  Surgeon: Wonda Horner, MD;  Location: [facility] ENDOSCOPY;  Service: Endoscopy;  Laterality: Left;  . ESOPHAGOGASTRODUODENOSCOPY N/A 10/10/2014   Procedure: ESOPHAGOGASTRODUODENOSCOPY (EGD);  Surgeon: Ladene Artist, MD;  Location: [facility] ENDOSCOPY;  Service: Endoscopy;  Laterality: N/A;  . KNEE ARTHROSCOPY Left ~ 1999    Social History: Social History   Tobacco Use  . Smoking status: Never Smoker  . Smokeless tobacco: Current User    Types: Snuff  Substance Use Topics  . Alcohol use: Yes    Alcohol/week: 6.0 standard drinks    Types: 6 Cans of beer per week  . Drug use: No    Family History: Family History  Problem Relation Age of Onset  . Other Mother        Unsure of medical history.  . Diabetes Mellitus II Father     Allergies and Medications: Allergies  Allergen Reactions  . Lisinopril Other (See Comments)    Pt reports nose bleed.  . Drug Ingredient [Spironolactone]     Hyperkalemia   No current facility-administered medications on file prior to encounter.   Current Outpatient Medications on File Prior to Encounter  Medication Sig Dispense Refill  . amLODipine (NORVASC) 10 MG tablet Take 1 tablet (10 mg total) by mouth daily. 30 tablet 1  . aspirin EC 81 MG tablet Take 1 tablet (81 mg total) by mouth at bedtime. (Patient taking differently: Take 81 mg by mouth daily. ) 90 tablet 3  . atorvastatin (LIPITOR) 80 MG tablet TAKE 1 TABLET (80 MG TOTAL) BY MOUTH AT BEDTIME. 30 tablet 3  . chlorthalidone (HYGROTON) 25 MG tablet Take 1 tablet (25 mg total) by mouth daily. 30 tablet 1  . Continuous  Blood Gluc Sensor (DEXCOM G6 SENSOR) MISC 1 Units by Does not apply route as directed. 1 each 3  . Continuous Blood Gluc Transmit (DEXCOM G6 TRANSMITTER) MISC Use every 90 days 1 each 3  . divalproex (DEPAKOTE ER) 500 MG 24 hr tablet Take 2 tablets (1,000 mg total) by mouth at bedtime. (Patient taking differently: Take 1,000 mg by mouth daily. ) 180 tablet 4  . FARXIGA 10 MG TABS tablet Take 1 tablet (10 mg total) by mouth daily. 30 tablet 1  . folic acid (FOLVITE) 1 MG tablet Take 1 tablet (1 mg total) by mouth daily. 90 tablet 3  . gabapentin (NEURONTIN) 300 MG capsule Take 3 capsules (900 mg  total) by mouth 3 (three) times daily. 360 capsule 0  . glucose blood (TRUE METRIX BLOOD GLUCOSE TEST) test strip Please use to check blood sugar up to three times daily.  100 each PRN  . insulin aspart (FIASP FLEXTOUCH) 100 UNIT/ML FlexTouch Pen Inject 4-6 Units into the skin 3 (three) times daily after meals.    . insulin glargine (LANTUS) 100 UNIT/ML Solostar Pen Inject 100 Units into the skin every morning. (Patient taking differently: Inject 15 Units into the skin every morning. ) 3 mL 0  . Insulin Pen Needle (PEN NEEDLES 3/16") 31G X 5 MM MISC 1 each by Does not apply route in the morning and at bedtime. 100 each 1  . lidocaine-prilocaine (EMLA) cream Apply 1 application topically as needed.    . metFORMIN (GLUCOPHAGE-XR) 500 MG 24 hr tablet Take 1 tablet (500 mg total) by mouth daily with breakfast. 30 tablet 1  . metoprolol succinate (TOPROL XL) 50 MG 24 hr tablet Take 1 tablet (50 mg total) by mouth daily. 180 tablet 1  . mirtazapine (REMERON) 30 MG tablet Take 1 tablet (30 mg total) by mouth at bedtime. 30 tablet 1  . naproxen (NAPROSYN) 500 MG tablet Take 1 tablet (500 mg total) by mouth every 12 (twelve) hours as needed.    . nystatin (MYCOSTATIN) 100000 UNIT/ML suspension Take 5 mLs (500,000 Units total) by mouth 4 (four) times daily. 60 mL 0  . omeprazole (PRILOSEC) 20 MG capsule Take 1 capsule  (20 mg total) by mouth daily. 90 capsule 3  . Potassium Chloride ER 20 MEQ TBCR Take 20 mEq by mouth daily. 30 tablet 1  . Semaglutide,0.25 or 0.5MG/DOS, (OZEMPIC, 0.25 OR 0.5 MG/DOSE,) 2 MG/1.5ML SOPN Inject 0.5 mg into the skin once a week. (Patient taking differently: Inject 0.5 mg into the skin once a week. Friday) 6 mL 0  . sildenafil (VIAGRA) 100 MG tablet TAKE 1 TABLET BY MOUTH ONCE DAILY AS NEEDED FOR ERECTILE DYSFUNCTION (Patient taking differently: Take 100 mg by mouth as needed for erectile dysfunction. ) 10 tablet 0  . thiamine (VITAMIN B-1) 100 MG tablet Take 1 tablet (100 mg total) by mouth daily. (Patient taking differently: Take 250 mg by mouth daily. ) 30 tablet 12  . torsemide (DEMADEX) 20 MG tablet Take 1 tablet (20 mg total) by mouth daily. 30 tablet 1  . vitamin E 180 MG (400 UNITS) capsule Take 400 Units by mouth daily.      Objective: BP (!) 134/92   Pulse (!) 107   Temp (!) 97.3 F (36.3 C) (Temporal)   Resp (!) 21   Ht _0  (1.778 m)   Wt 101.6 kg   SpO2 100%   BMI 32.14 kg/m  Exam: General: Alert, resting comfortably in bed Eyes: PERRL ENTM: Moist mucous membranes Neck: No cervical lymphadenopathy Cardiovascular: Tachycardic, normal S1/S2, no M/R/G Respiratory: Normal WOB on room air, lungs CTAB Gastrointestinal: Soft, nontender, nondistended, no organomegaly, no fluid wave MSK: Trace nonpitting pedal edema, extremities warm and well-perfused Derm: No obvious skin sequela of cirrhosis Neuro: CN II-XII intact, 5/5 strength in all extremities, oriented x3 Psych: Slow to respond to questions, otherwise normal speech, normal affect  Labs and Imaging: CBC BMET  Recent Labs  Lab 10/31/20 1500 10/31/20 1515  WBC 17.0*  --   HGB 12.4* 14.3  HCT 38.3* 42.0  PLT 387  --    Recent Labs  Lab 10/31/20 1500 10/31/20 1515  NA 132* 129*  K 3.4*  3.3*  CL 82*  --   CO2 24  --   BUN 19  --   CREATININE 1.53*  --   GLUCOSE 465*  --   CALCIUM 7.7*  --       EKG: NSR at 96 bpm, LAD, no ST-T wave changes  CT Head Wo Contrast Result Date: 10/31/2020 IMPRESSION: 1. No acute intracranial pathology. 2. Similar mild diffuse parenchymal atrophy with ex vacuo dilatation of ventricular system.  DG Chest Port 1 View Result Date: 10/31/2020 IMPRESSION: 1. No acute intrathoracic process.     Alcus Dad, MD 10/31/2020, 5:01 PM PGY-1, [place] pager: [phone], text pages welcome

## 2020-10-31 NOTE — ED Provider Notes (Signed)
[facility] EMERGENCY DEPARTMENT Provider Note   CSN: [id number] Arrival date & time: 10/31/20  1444     History Chief Complaint  Patient presents with  . Hyperglycemia  . Melena  . Altered Mental Status    Jeffrey [name] is a 55 y.o. male.   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  Patient reports that he has had black stools for the last 4 days, decreased oral intake and some nausea and vomiting.  He reports he has been unable to get out of bed and told his son today that he wanted to get out of bed but when he helped him he was unable to stand and fell to the ground.  Patient denies loss of consciousness, headache or neck pain.  He denies recent fever, cough or congestion.  He denies any abdominal pain.  Patient reports he does not take any anticoagulation.  No recent alcohol use.  The history is provided by the patient, the EMS personnel and a relative.  Hyperglycemia Blood sugar level PTA:  >600 Severity:  Severe Associated symptoms: altered mental status   Altered Mental Status      Past Medical History:  Diagnosis Date  . Acute renal insufficiency 12/08/2012  . AKI (acute kidney injury) ([place])   . ALCOHOL ABUSE, HX OF 11/10/2007  . Anoxic encephalopathy ([place])   . Anxiety   . Bleeding ulcer 2014  . Blood transfusion 2014   "related to bleeding ulcer"  . Cardiac arrest ([place]) 12/07/2012   Anoxic encephalopathy  . GERD (gastroesophageal reflux disease)   . High cholesterol   . Hypertension   . OSA on CPAP 12/07/2012  . Seizure-like activity ([place])   . Type II diabetes mellitus ([place])   . Venous insufficiency 12/13/2011    Patient Active Problem List    Diagnosis Date Noted  . DKA (diabetic ketoacidosis) ([place]) 07/22/2020  . Candidiasis 07/22/2020  . Hyponatremia 06/28/2020  . Eyebrow laceration, left, initial encounter   . Hypokalemia   . Hyperlipidemia associated with type 2 diabetes mellitus ([place])   . MGUS (monoclonal gammopathy of unknown significance) 05/31/2020  . Seizures ([place]) 03/16/2020  . Type 2 diabetes mellitus with hyperglycemia, with long-term current use of insulin ([place]) 03/16/2020  . Tremor 03/16/2020  . Colon cancer screening 02/22/2020  . Stage 3a chronic kidney disease ([place]) 01/29/2020  . Hyperphosphatemia 01/29/2020  . Need for immunization against influenza 06/04/2019  . Major depressive disorder, recurrent episode ([place]) 01/13/2018  . Seizure ([place]) 04/08/2017  . Proteinuria 11/23/2015  . Essential hypertension   . Hepatic cirrhosis ([place]) 03/22/2015  . Ataxia 03/20/2015  . Dysarthria 03/09/2015  . ETOH abuse   . Hyperkalemia   . Tachycardia   . Erosive esophagitis 10/10/2014  . Acute kidney injury ([place])   . OSA on CPAP 12/28/2012  . Cardiac arrest ([place]) 12/07/2012  . Anoxic brain injury ([place]) 12/07/2012  . ERECTILE DYSFUNCTION 04/14/2007  . Type 2 diabetes mellitus with diabetic neuropathy, with long-term current use of insulin ([place]) 12/04/2006  . HYPERCHOLESTEROLEMIA 12/04/2006    Past Surgical History:  Procedure Laterality Date  . CARDIOVASCULAR STRESS TEST  03/16/13   Very Poor Exercise Tolerance; NON DIAGNOSTIC TEST  .  CATARACT EXTRACTION W/ INTRAOCULAR LENS IMPLANT Left 03/07/2014   [place] @ [facility]  . ESOPHAGOGASTRODUODENOSCOPY Left 11/26/2012   Procedure: ESOPHAGOGASTRODUODENOSCOPY (EGD);  Surgeon: Wonda Horner, MD;  Location[facility] ENDOSCOPY;  Service: Endoscopy;  Laterality: Left;  . ESOPHAGOGASTRODUODENOSCOPY N/A 10/10/2014   Procedure: ESOPHAGOGASTRODUODENOSCOPY (EGD);  Surgeon: Ladene Artist, MD;  Location: [facility] ENDOSCOPY;  Service: Endoscopy;  Laterality: N/A;  . KNEE ARTHROSCOPY  Left ~ 1999       Family History  Problem Relation Age of Onset  . Other Mother        Unsure of medical history.  . Diabetes Mellitus II Father     Social History   Tobacco Use  . Smoking status: Never Smoker  . Smokeless tobacco: Current User    Types: Snuff  Substance Use Topics  . Alcohol use: Yes    Alcohol/week: 6.0 standard drinks    Types: 6 Cans of beer per week  . Drug use: No    Home Medications Prior to Admission medications   Medication Sig Start Date End Date Taking? Authorizing Provider  amLODipine (NORVASC) 10 MG tablet Take 1 tablet (10 mg total) by mouth daily. 07/26/20   Shelly Coss, MD  aspirin EC 81 MG tablet Take 1 tablet (81 mg total) by mouth at bedtime. Patient taking differently: Take 81 mg by mouth daily.  06/24/19   Zenia Resides, MD  atorvastatin (LIPITOR) 80 MG tablet TAKE 1 TABLET (80 MG TOTAL) BY MOUTH AT BEDTIME. 07/27/20   [name], MD  chlorthalidone (HYGROTON) 25 MG tablet Take 1 tablet (25 mg total) by mouth daily. 07/25/20   Shelly Coss, MD  Continuous Blood Gluc Sensor (DEXCOM G6 SENSOR) MISC 1 Units by Does not apply route as directed. 09/26/20   [name], MD  Continuous Blood Gluc Transmit (DEXCOM G6 TRANSMITTER) MISC Use every 90 days 10/05/20   [name], MD  divalproex (DEPAKOTE ER) 500 MG 24 hr tablet Take 2 tablets (1,000 mg total) by mouth at bedtime. Patient taking differently: Take 1,000 mg by mouth daily.  03/16/20   Marcial Pacas, MD  FARXIGA 10 MG TABS tablet Take 1 tablet (10 mg total) by mouth daily. 07/25/20   Shelly Coss, MD  folic acid (FOLVITE) 1 MG tablet Take 1 tablet (1 mg total) by mouth daily. 07/04/20   [name], MD  gabapentin (NEURONTIN) 300 MG capsule Take 3 capsules (900 mg total) by mouth 3 (three) times daily. 09/12/20   [name], MD  glucose blood (TRUE METRIX BLOOD GLUCOSE TEST) test strip Please use to check blood sugar up to three times daily.  07/03/20    [name], MD  insulin aspart (FIASP FLEXTOUCH) 100 UNIT/ML FlexTouch Pen Inject 4-6 Units into the skin 3 (three) times daily after meals.    [provider]  insulin glargine (LANTUS) 100 UNIT/ML Solostar Pen Inject 100 Units into the skin every morning. Patient taking differently: Inject 15 Units into the skin every morning.  08/08/20   [name], MD  Insulin Pen Needle (PEN NEEDLES 3/16") 31G X 5 MM MISC 1 each by Does not apply route in the morning and at bedtime. 07/25/20   Shelly Coss, MD  lidocaine-prilocaine (EMLA) cream Apply 1 application topically as needed.    [provider]  metFORMIN (GLUCOPHAGE-XR) 500 MG 24 hr tablet Take 1 tablet (500 mg total) by mouth daily with breakfast. 07/25/20   Shelly Coss, MD  metoprolol succinate (TOPROL XL) 50 MG  24 hr tablet Take 1 tablet (50 mg total) by mouth daily. 08/08/20 11/06/20  [name], MD  mirtazapine (REMERON) 30 MG tablet Take 1 tablet (30 mg total) by mouth at bedtime. 07/25/20   Shelly Coss, MD  naproxen (NAPROSYN) 500 MG tablet Take 1 tablet (500 mg total) by mouth every 12 (twelve) hours as needed. 07/25/20   Shelly Coss, MD  nystatin (MYCOSTATIN) 100000 UNIT/ML suspension Take 5 mLs (500,000 Units total) by mouth 4 (four) times daily. 07/25/20   Shelly Coss, MD  omeprazole (PRILOSEC) 20 MG capsule Take 1 capsule (20 mg total) by mouth daily. 06/06/20   [name], MD  Potassium Chloride ER 20 MEQ TBCR Take 20 mEq by mouth daily. 10/16/20 11/15/20  [name], MD  Semaglutide,0.25 or 0.5MG /DOS, (OZEMPIC, 0.25 OR 0.5 MG/DOSE,) 2 MG/1.5ML SOPN Inject 0.5 mg into the skin once a week. Patient taking differently: Inject 0.5 mg into the skin once a week. Friday 06/22/20   Leavy Cella, RPH-CPP  sildenafil (VIAGRA) 100 MG tablet TAKE 1 TABLET BY MOUTH ONCE DAILY AS NEEDED FOR ERECTILE DYSFUNCTION Patient taking differently: Take 100 mg by mouth as needed for erectile  dysfunction.  05/29/20   [name], MD  thiamine (VITAMIN B-1) 100 MG tablet Take 1 tablet (100 mg total) by mouth daily. Patient taking differently: Take 250 mg by mouth daily.  05/27/16   [name], DO  torsemide (DEMADEX) 20 MG tablet Take 1 tablet (20 mg total) by mouth daily. 07/25/20   Shelly Coss, MD  vitamin E 180 MG (400 UNITS) capsule Take 400 Units by mouth daily.    [provider]    Allergies    Lisinopril and Drug ingredient [spironolactone]  Review of Systems   Review of Systems  All other systems reviewed and are negative.   Physical Exam Updated Vital Signs There were no vitals taken for this visit.  Physical Exam Vitals and nursing note reviewed.  Constitutional:      General: He is not in acute distress.    Appearance: He is well-developed and well-nourished. He is obese. He is ill-appearing.  HENT:     Head: Normocephalic and atraumatic.     Mouth/Throat:     Mouth: Oropharynx is clear and moist.     Comments: Dark emesis present at the corner of the mouth Eyes:     Extraocular Movements: EOM normal.     Conjunctiva/sclera: Conjunctivae normal.     Pupils: Pupils are equal, round, and reactive to light.  Cardiovascular:     Rate and Rhythm: Regular rhythm. Tachycardia present.     Pulses: Normal pulses and intact distal pulses.     Heart sounds: No murmur heard.   Pulmonary:     Effort: Pulmonary effort is normal. No respiratory distress.     Breath sounds: Normal breath sounds. No wheezing or rales.  Abdominal:     General: There is no distension.     Palpations: Abdomen is soft.     Tenderness: There is no abdominal tenderness. There is no guarding or rebound.  Genitourinary:    Rectum: Guaiac result positive.     Comments: Black stool on exam that is heme positive Musculoskeletal:        General: No tenderness. Normal range of motion.     Cervical back: Normal range of motion and neck supple.     Right lower leg:  Edema present.     Left lower leg: Edema present.  Skin:  General: Skin is warm and dry.     Findings: No erythema or rash.  Neurological:     Mental Status: He is alert and oriented to person, place, and time.     Sensory: No sensory deficit.     Comments: Generalized weakness but no focal deficits.  No asterixis.  Psychiatric:        Mood and Affect: Mood and affect normal.     Comments: Awake and alert and cooperative     ED Results / Procedures / Treatments   Labs (all labs ordered are listed, but only abnormal results are displayed) Labs Reviewed  SARS CORONAVIRUS 2 (TAT 6-24 HRS)  CBC WITH DIFFERENTIAL/PLATELET  COMPREHENSIVE METABOLIC PANEL  PROTIME-INR  APTT  LACTIC ACID, PLASMA  URINALYSIS, ROUTINE W REFLEX MICROSCOPIC  I-STAT VENOUS BLOOD GAS, ED  POC OCCULT BLOOD, ED  TYPE AND SCREEN    EKG EKG Interpretation  Date/Time:  Tuesday October 31 2020 14:52:10 EST Ventricular Rate:  101 PR Interval:    QRS Duration: 106 QT Interval:  404 QTC Calculation: 524 R Axis:   -80 Text Interpretation: Sinus tachycardia LAD, consider left anterior fascicular block Abnormal R-wave progression, late transition Prolonged QT interval No significant change since last tracing Confirmed by Blanchie Dessert [phone]) on 10/31/2020 2:53:50 PM   Radiology No results found.  Procedures Procedures   Medications Ordered in ED Medications  lactated ringers bolus 1,000 mL (has no administration in time range)    ED Course  I have reviewed the triage vital signs and the nursing notes.  Pertinent labs & imaging results that were available during my care of the patient were reviewed by me and considered in my medical decision making (see chart for details).    MDM Rules/Calculators/A&P                          Patient is a 55 year old male with multiple medical problems presenting today with generalized weakness, altered mental status, fall at home.  Patient reports 4 days of  black stools.  Patient smells of ketones on arrival with tachycardia and concern for DKA.  Also patient's stool is black but has no focal abdominal pain.  He does not take anticoagulation.  He does have a history of cirrhosis and alcohol abuse in the past but denies any recent alcohol use.  He is not displaying symptoms consistent for hepatic encephalopathy but will check an ammonia level.  Head CT, chest x-ray, blood work is pending.  Will give 1 L of fluid and Protonix.  EMS noted dark emesis with occasional blood streaks but no gross blood.   Final Clinical Impression(s) / ED Diagnoses Final diagnoses:  None    Rx / DC Orders ED Discharge Orders    None       Blanchie Dessert, MD 10/31/20 1510

## 2020-10-31 NOTE — ED Provider Notes (Signed)
3:32 PM Care assumed from Dr. Maryan Rued.  At time of transfer care, patient is waiting for results of laboratory testing.  Patient was found to have hyperglycemia with glucose over 600 and clinical concern for DKA.  Patient also had lightheaded and near syncopal episode leading to a fall today.  Patient was having nausea and vomiting and fatigue.  Patient also had evidence of dark rectal bleeding and some dark emesis concerning for likely upper GI bleed and hematemesis.  Plan of care is to complete work-up including labs and imaging and they suspect patient will likely admission for further management.  4:25 PM Work-up has begun to return and it does look like he is likely in HHS versus early DKA.  His anion gap is found to be 26 and his glucose is 465.  Chest x-ray did not show acute intrathoracic process and CT head showed no acute intracranial abnormality.  Alcohol was found to be elevated.  Due to the concern for GI bleed, patient was started on Protonix by previous team.  His hemoglobin is 12.4 and similar to prior.  Given his history of cirrhosis with GI bleed, will discuss antibiotics with admitting team.  Patient is a family medicine resident patient, limited family medicine for further management of fatigue, DKA/HHS, and GI bleeding.    CRITICAL CARE Performed by: Gwenyth Allegra Jermiah Soderman Total critical care time: 35 minutes Critical care time was exclusive of separately billable procedures and treating other patients. Critical care was necessary to treat or prevent imminent or life-threatening deterioration. Critical care was time spent personally by me on the following activities: development of treatment plan with patient and/or surrogate as well as nursing, discussions with consultants, evaluation of patient's response to treatment, examination of patient, obtaining history from patient or surrogate, ordering and performing treatments and interventions, ordering and review of laboratory  studies, ordering and review of radiographic studies, pulse oximetry and re-evaluation of patient's condition.    Seddrick Flax, Gwenyth Allegra, MD 10/31/20 2312

## 2020-10-31 NOTE — Progress Notes (Signed)
FMTS Brief Note Jeffrey Singh, MD   For questions about this patient, please use [email or web address] to page the family medicine resident on call. Pager number [phone].    I  have seen and examined this patient, reviewed their chart. I have discussed this patient with the resident. Will sign full note as available.  55 year old with history of cirrhosis, alcohol use disorder, seizure disorder, type 2 diabetes on insulin, CKD III A, and cardiac arrest (2014) presenting with melena, hematemesis, and fatigue. Patient provides some history---reports 6 days of illness. Started with body aches and subjective fevers. He stopped taking medications but continued to drink (4-8 12 ounces beers). Endorses multiple episodes of 'black' stool and 6 episodes of black colored bloody vomit. No chest pain, cough, congestion, change in LE edema.   PMH- reviewed, appears cirrhosis relatively newly diagnosed  Cardiac arrest 2014- per Dr. Marlou Porch note unclear cause (OSA vs. Neurologic)  Spells- seen by Neurology--treated with antiepileptics--previously on Depakote, tapering this, started lamotrigine in June 2021  EGD 2014- Small 1 cm ulcer esophagogastric junction, hiatal hernia, no varices in report,  EGD 2016- Severe esophagitis   Surgical history- 2 EGDs as above  Socially- lives with wife, drinks between 4-8 beers per day   Today's Vitals   10/31/20 1730 10/31/20 1800 10/31/20 1830 10/31/20 1900  BP: (!) 128/94 124/85 124/62 126/89  Pulse: (!) 112 (!) 109 (!) 111 (!) 113  Resp: 18 20 16 20   Temp:      TempSrc:      SpO2: 100% 98% 100% 100%  Weight:      Height:      PainSc:       Body mass index is 32.14 kg/m. Tired man lying at 20 degrees on bed, comfortable, no distress, awake and alert and oriented to person and place, does not know date, variable to history of resident physicians  Cardiac: Tachycardic but rhythm. Normal S1/S2. No murmurs, rubs, or gallops appreciated. Lungs: Clear bilaterally to  ascultation.  Abdomen: Soft, obese, no masses, no tenderness, suspect + ascites   LE with 1+ edema, hyperpigmented somewhat thickened skin in LE  Labs CBC reviewed--- leukocytosis with anemia (near baseline)  CMP- hyperglycemia with appropriately corrected hyponatremia, hypokalemia noted, marked anion gap  Creatinine near baseline  Elevated ALP and AST>ALT Albumin 2.0  INR 2.0   Lactate 4.0  FOBT + VBG-- Alkalosis 7.51/35/94/28  CXR reviewed, CT head reviewed  MELD score 18   1. Hematemesis and melena in setting of cirrhosis, with concern for gastritis, variceal (less likely but still of concern), gastric ulcer, worsening esophagitis--appreciate GI consultation and care. IV pantoprazole, ocreotide and given concern for variceal bleed with ascites, ceftriaxone. Monitor CBC overnight, progressive unit.  2. Altered mental status, likely multifactorial due to acute illness, hyperglycemia, alcohol use (level noted), hepatic encephalopathy. Less likely but must consider post-ictal state, if not improving as illness improves, recommend EEG. 3. Hyperglycemia with anion gap without acidosis, insulin drip, monitor hypokalemia, use caution with fluids given history suspected heart faiure with preserved EF (2016). 4. Meeting sepsis criteria--suspect that changes in vitals due to ongoing uncontrolled diabetes and potential GI bleed, but still of concern is infection. Urine and blood cultures, if ascites confirmed, recommend sampling for possible spontaneous bacterial peritonitis. If clinical status changes overnight, recommend CT abdomen/pelvis.  5. History of alcohol use disorder with history of seizures-- CIWA with PRN ativan.  6. Metabolic alkalosis, likely due to emesis, ongoing diuretic use.  7. Hypocalcemia, corrects to  normal.  8. Hypokalemia- due to hyperglycemia and above metabolic derhangements, vomiting, and diuretic use.  9. History of seizure disorder, possible, continue Keppra  IV 10. Elevated lactate, due to vomiting, inability to tolerate PO, and thus dehydration- IV fluids as above.  11. CKD III A- stable near baseline.

## 2020-10-31 NOTE — ED Notes (Signed)
Pt stated he has not taken his home medications since January 1st

## 2020-10-31 NOTE — ED Notes (Signed)
Sandostatin paused for 20 minutes to run in protonix, due to limited IV access

## 2020-11-01 ENCOUNTER — Encounter (HOSPITAL_COMMUNITY): Payer: Self-pay | Admitting: Family Medicine

## 2020-11-01 ENCOUNTER — Encounter (HOSPITAL_COMMUNITY)
Admission: EM | Disposition: A | Discharge status: Home / Self Care | Payer: Self-pay | Source: Home / Self Care | Attending: Family Medicine

## 2020-11-01 ENCOUNTER — Observation Stay (HOSPITAL_COMMUNITY): Payer: 59

## 2020-11-01 ENCOUNTER — Observation Stay (HOSPITAL_COMMUNITY): Payer: 59 | Admitting: Anesthesiology

## 2020-11-01 DIAGNOSIS — K920 Hematemesis: Secondary | ICD-10-CM | POA: Diagnosis not present

## 2020-11-01 DIAGNOSIS — K7030 Alcoholic cirrhosis of liver without ascites: Secondary | ICD-10-CM | POA: Diagnosis present

## 2020-11-01 DIAGNOSIS — Z8674 Personal history of sudden cardiac arrest: Secondary | ICD-10-CM | POA: Diagnosis not present

## 2020-11-01 DIAGNOSIS — G4733 Obstructive sleep apnea (adult) (pediatric): Secondary | ICD-10-CM | POA: Diagnosis present

## 2020-11-01 DIAGNOSIS — Z20822 Contact with and (suspected) exposure to covid-19: Secondary | ICD-10-CM | POA: Diagnosis present

## 2020-11-01 DIAGNOSIS — E876 Hypokalemia: Secondary | ICD-10-CM | POA: Diagnosis present

## 2020-11-01 DIAGNOSIS — K2970 Gastritis, unspecified, without bleeding: Secondary | ICD-10-CM | POA: Diagnosis present

## 2020-11-01 DIAGNOSIS — W06XXXA Fall from bed, initial encounter: Secondary | ICD-10-CM | POA: Diagnosis present

## 2020-11-01 DIAGNOSIS — I129 Hypertensive chronic kidney disease with stage 1 through stage 4 chronic kidney disease, or unspecified chronic kidney disease: Secondary | ICD-10-CM | POA: Diagnosis present

## 2020-11-01 DIAGNOSIS — R531 Weakness: Secondary | ICD-10-CM | POA: Diagnosis present

## 2020-11-01 DIAGNOSIS — G40909 Epilepsy, unspecified, not intractable, without status epilepticus: Secondary | ICD-10-CM | POA: Diagnosis present

## 2020-11-01 DIAGNOSIS — G9341 Metabolic encephalopathy: Secondary | ICD-10-CM | POA: Diagnosis present

## 2020-11-01 DIAGNOSIS — Y92009 Unspecified place in unspecified non-institutional (private) residence as the place of occurrence of the external cause: Secondary | ICD-10-CM | POA: Diagnosis not present

## 2020-11-01 DIAGNOSIS — F10129 Alcohol abuse with intoxication, unspecified: Secondary | ICD-10-CM | POA: Diagnosis not present

## 2020-11-01 DIAGNOSIS — E1140 Type 2 diabetes mellitus with diabetic neuropathy, unspecified: Secondary | ICD-10-CM | POA: Diagnosis present

## 2020-11-01 DIAGNOSIS — F1010 Alcohol abuse, uncomplicated: Secondary | ICD-10-CM | POA: Diagnosis not present

## 2020-11-01 DIAGNOSIS — R739 Hyperglycemia, unspecified: Secondary | ICD-10-CM | POA: Diagnosis not present

## 2020-11-01 DIAGNOSIS — K219 Gastro-esophageal reflux disease without esophagitis: Secondary | ICD-10-CM | POA: Diagnosis present

## 2020-11-01 DIAGNOSIS — K921 Melena: Secondary | ICD-10-CM

## 2020-11-01 DIAGNOSIS — N1831 Chronic kidney disease, stage 3a: Secondary | ICD-10-CM | POA: Diagnosis present

## 2020-11-01 DIAGNOSIS — E785 Hyperlipidemia, unspecified: Secondary | ICD-10-CM | POA: Diagnosis present

## 2020-11-01 DIAGNOSIS — K860 Alcohol-induced chronic pancreatitis: Secondary | ICD-10-CM | POA: Diagnosis present

## 2020-11-01 DIAGNOSIS — E1110 Type 2 diabetes mellitus with ketoacidosis without coma: Secondary | ICD-10-CM | POA: Diagnosis present

## 2020-11-01 DIAGNOSIS — E1122 Type 2 diabetes mellitus with diabetic chronic kidney disease: Secondary | ICD-10-CM | POA: Diagnosis present

## 2020-11-01 DIAGNOSIS — E1169 Type 2 diabetes mellitus with other specified complication: Secondary | ICD-10-CM | POA: Diagnosis present

## 2020-11-01 DIAGNOSIS — K2081 Other esophagitis with bleeding: Secondary | ICD-10-CM | POA: Diagnosis present

## 2020-11-01 DIAGNOSIS — K264 Chronic or unspecified duodenal ulcer with hemorrhage: Principal | ICD-10-CM

## 2020-11-01 DIAGNOSIS — E871 Hypo-osmolality and hyponatremia: Secondary | ICD-10-CM | POA: Diagnosis present

## 2020-11-01 DIAGNOSIS — K7460 Unspecified cirrhosis of liver: Secondary | ICD-10-CM | POA: Diagnosis not present

## 2020-11-01 DIAGNOSIS — R6510 Systemic inflammatory response syndrome (SIRS) of non-infectious origin without acute organ dysfunction: Secondary | ICD-10-CM | POA: Diagnosis present

## 2020-11-01 DIAGNOSIS — D62 Acute posthemorrhagic anemia: Secondary | ICD-10-CM

## 2020-11-01 DIAGNOSIS — Y907 Blood alcohol level of 200-239 mg/100 ml: Secondary | ICD-10-CM | POA: Diagnosis present

## 2020-11-01 HISTORY — PX: BIOPSY: SHX5522

## 2020-11-01 HISTORY — PX: ESOPHAGOGASTRODUODENOSCOPY (EGD) WITH PROPOFOL: SHX5813

## 2020-11-01 LAB — CBG MONITORING, ED
Glucose-Capillary: 204 mg/dL — ABNORMAL HIGH (ref 70–99)
Glucose-Capillary: 282 mg/dL — ABNORMAL HIGH (ref 70–99)

## 2020-11-01 LAB — COMPREHENSIVE METABOLIC PANEL
ALT: 31 U/L (ref 0–44)
ALT: 29 U/L (ref 0–44)
AST: 31 U/L (ref 15–41)
AST: 27 U/L (ref 15–41)
Albumin: 1.8 g/dL — ABNORMAL LOW (ref 3.5–5.0)
Albumin: 1.5 g/dL — ABNORMAL LOW (ref 3.5–5.0)
Alkaline Phosphatase: 168 U/L — ABNORMAL HIGH (ref 38–126)
Alkaline Phosphatase: 150 U/L — ABNORMAL HIGH (ref 38–126)
Anion gap: 15 (ref 5–15)
Anion gap: 15 (ref 5–15)
BUN: 14 mg/dL (ref 6–20)
BUN: 13 mg/dL (ref 6–20)
CO2: 31 mmol/L (ref 22–32)
CO2: 30 mmol/L (ref 22–32)
Calcium: 7.6 mg/dL — ABNORMAL LOW (ref 8.9–10.3)
Calcium: 7.6 mg/dL — ABNORMAL LOW (ref 8.9–10.3)
Chloride: 89 mmol/L — ABNORMAL LOW (ref 98–111)
Chloride: 89 mmol/L — ABNORMAL LOW (ref 98–111)
Creatinine, Ser: 1.1 mg/dL (ref 0.61–1.24)
Creatinine, Ser: 1.11 mg/dL (ref 0.61–1.24)
GFR, Estimated: >60 mL/min (ref >60)
GFR, Estimated: >60 mL/min (ref >60)
Glucose, Bld: 153 mg/dL — ABNORMAL HIGH (ref 70–99)
Glucose, Bld: 233 mg/dL — ABNORMAL HIGH (ref 70–99)
Potassium: 3 mmol/L — ABNORMAL LOW (ref 3.5–5.1)
Potassium: 3.5 mmol/L (ref 3.5–5.1)
Sodium: 135 mmol/L (ref 135–145)
Sodium: 134 mmol/L — ABNORMAL LOW (ref 135–145)
Total Bilirubin: 0.7 mg/dL (ref 0.3–1.2)
Total Bilirubin: 1.1 mg/dL (ref 0.3–1.2)
Total Protein: 5.8 g/dL — ABNORMAL LOW (ref 6.5–8.1)
Total Protein: 5.1 g/dL — ABNORMAL LOW (ref 6.5–8.1)

## 2020-11-01 LAB — CBC
HCT: 31.1 % — ABNORMAL LOW (ref 39.0–52.0)
HCT: 28.5 % — ABNORMAL LOW (ref 39.0–52.0)
Hemoglobin: 10.5 g/dL — ABNORMAL LOW (ref 13.0–17.0)
Hemoglobin: 9.7 g/dL — ABNORMAL LOW (ref 13.0–17.0)
MCH: 31.1 pg (ref 26.0–34.0)
MCH: 30.7 pg (ref 26.0–34.0)
MCHC: 33.8 g/dL (ref 30.0–36.0)
MCHC: 34 g/dL (ref 30.0–36.0)
MCV: 92 fL (ref 80.0–100.0)
MCV: 90.2 fL (ref 80.0–100.0)
Platelets: 351 10*3/uL (ref 150–400)
Platelets: 330 10*3/uL (ref 150–400)
RBC: 3.38 MIL/uL — ABNORMAL LOW (ref 4.22–5.81)
RBC: 3.16 MIL/uL — ABNORMAL LOW (ref 4.22–5.81)
RDW: 16.5 % — ABNORMAL HIGH (ref 11.5–15.5)
RDW: 16.3 % — ABNORMAL HIGH (ref 11.5–15.5)
WBC: 15.4 10*3/uL — ABNORMAL HIGH (ref 4.0–10.5)
WBC: 14.4 10*3/uL — ABNORMAL HIGH (ref 4.0–10.5)
nRBC: 0.1 % (ref 0.0–0.2)
nRBC: 0 % (ref 0.0–0.2)

## 2020-11-01 LAB — RENAL FUNCTION PANEL
Albumin: 1.4 g/dL — ABNORMAL LOW (ref 3.5–5.0)
Anion gap: 10 (ref 5–15)
BUN: 15 mg/dL (ref 6–20)
CO2: 27 mmol/L (ref 22–32)
Calcium: 6.6 mg/dL — ABNORMAL LOW (ref 8.9–10.3)
Chloride: 96 mmol/L — ABNORMAL LOW (ref 98–111)
Creatinine, Ser: 1.17 mg/dL (ref 0.61–1.24)
GFR, Estimated: >60 mL/min (ref >60)
Glucose, Bld: 233 mg/dL — ABNORMAL HIGH (ref 70–99)
Phosphorus: 2.2 mg/dL — ABNORMAL LOW (ref 2.5–4.6)
Potassium: 6 mmol/L — ABNORMAL HIGH (ref 3.5–5.1)
Sodium: 133 mmol/L — ABNORMAL LOW (ref 135–145)

## 2020-11-01 LAB — LACTIC ACID, PLASMA: Lactic Acid, Venous: 1.5 mmol/L (ref 0.5–1.9)

## 2020-11-01 LAB — GLUCOSE, CAPILLARY
Glucose-Capillary: 213 mg/dL — ABNORMAL HIGH (ref 70–99)
Glucose-Capillary: 256 mg/dL — ABNORMAL HIGH (ref 70–99)
Glucose-Capillary: 258 mg/dL — ABNORMAL HIGH (ref 70–99)
Glucose-Capillary: 418 mg/dL — ABNORMAL HIGH (ref 70–99)
Glucose-Capillary: 378 mg/dL — ABNORMAL HIGH (ref 70–99)

## 2020-11-01 LAB — MAGNESIUM: Magnesium: 1.8 mg/dL (ref 1.7–2.4)

## 2020-11-01 LAB — POTASSIUM: Potassium: 3.7 mmol/L (ref 3.5–5.1)

## 2020-11-01 LAB — HEMOGLOBIN A1C
Hgb A1c MFr Bld: 6.9 % — ABNORMAL HIGH (ref 4.8–5.6)
Mean Plasma Glucose: 151.33 mg/dL

## 2020-11-01 SURGERY — ESOPHAGOGASTRODUODENOSCOPY (EGD) WITH PROPOFOL
Anesthesia: General

## 2020-11-01 MED ORDER — LACTATED RINGERS IV SOLN
INTRAVENOUS | Status: AC | PRN
  Administered 2020-11-01: 10 mL/h via INTRAVENOUS

## 2020-11-01 MED ORDER — INSULIN ASPART 100 UNIT/ML ~~LOC~~ SOLN: 0.0000 [IU] | Freq: Three times a day (TID) | SUBCUTANEOUS | Status: DC

## 2020-11-01 MED ORDER — SUCCINYLCHOLINE CHLORIDE 20 MG/ML IJ SOLN
INTRAMUSCULAR | Status: DC | PRN
  Administered 2020-11-01: 100 mg via INTRAVENOUS

## 2020-11-01 MED ORDER — MAGNESIUM SULFATE 2 GM/50ML IV SOLN
2.0000 g | Freq: Once | INTRAVENOUS | Status: AC
  Administered 2020-11-01: 2 g via INTRAVENOUS
  Filled 2020-11-01: qty 50

## 2020-11-01 MED ORDER — INSULIN ASPART 100 UNIT/ML ~~LOC~~ SOLN
0.0000 [IU] | Freq: Every day | SUBCUTANEOUS | Status: DC
  Administered 2020-11-01: 5 [IU] via SUBCUTANEOUS
  Administered 2020-11-03: 3 [IU] via SUBCUTANEOUS

## 2020-11-01 MED ORDER — INSULIN ASPART 100 UNIT/ML ~~LOC~~ SOLN
0.0000 [IU] | Freq: Three times a day (TID) | SUBCUTANEOUS | Status: DC
  Administered 2020-11-01 – 2020-11-02 (×3): 3 [IU] via SUBCUTANEOUS
  Administered 2020-11-02 (×2): 2 [IU] via SUBCUTANEOUS
  Administered 2020-11-03: 1 [IU] via SUBCUTANEOUS

## 2020-11-01 MED ORDER — PROPOFOL 10 MG/ML IV BOLUS
INTRAVENOUS | Status: DC | PRN
  Administered 2020-11-01: 180 mg via INTRAVENOUS

## 2020-11-01 MED ORDER — LEVETIRACETAM IN NACL 500 MG/100ML IV SOLN
500.0000 mg | Freq: Two times a day (BID) | INTRAVENOUS | Status: DC
  Administered 2020-11-01 – 2020-11-02 (×3): 500 mg via INTRAVENOUS
  Filled 2020-11-01 (×4): qty 100

## 2020-11-01 MED ORDER — LIDOCAINE 2% (20 MG/ML) 5 ML SYRINGE
INTRAMUSCULAR | Status: DC | PRN
  Administered 2020-11-01: 100 mg via INTRAVENOUS

## 2020-11-01 MED ORDER — SODIUM CHLORIDE 0.9 % IV SOLN
50.0000 ug/h | INTRAVENOUS | Status: DC
  Administered 2020-11-01: 50 ug/h via INTRAVENOUS
  Filled 2020-11-01: qty 1

## 2020-11-01 MED ORDER — MAGNESIUM SULFATE 50 % IJ SOLN: 2.0000 g | Freq: Once | INTRAMUSCULAR | Status: DC

## 2020-11-01 MED ORDER — PANTOPRAZOLE SODIUM 40 MG IV SOLR
40.0000 mg | Freq: Two times a day (BID) | INTRAVENOUS | Status: DC
  Administered 2020-11-01 – 2020-11-02 (×2): 40 mg via INTRAVENOUS
  Filled 2020-11-01 (×2): qty 40

## 2020-11-01 MED ORDER — ONDANSETRON HCL 4 MG/2ML IJ SOLN
INTRAMUSCULAR | Status: DC | PRN
  Administered 2020-11-01: 4 mg via INTRAVENOUS

## 2020-11-01 MED ORDER — DEXAMETHASONE SODIUM PHOSPHATE 10 MG/ML IJ SOLN
INTRAMUSCULAR | Status: DC | PRN
  Administered 2020-11-01: 5 mg via INTRAVENOUS

## 2020-11-01 MED ORDER — K PHOS MONO-SOD PHOS DI & MONO 155-852-130 MG PO TABS
250.0000 mg | ORAL_TABLET | Freq: Once | ORAL | Status: AC
  Administered 2020-11-02: 250 mg via ORAL
  Filled 2020-11-01: qty 1

## 2020-11-01 MED ORDER — POTASSIUM PHOSPHATES 15 MMOLE/5ML IV SOLN
30.0000 mmol | Freq: Once | INTRAVENOUS | Status: AC
  Administered 2020-11-01: 30 mmol via INTRAVENOUS
  Filled 2020-11-01: qty 10

## 2020-11-01 MED ORDER — PHENYLEPHRINE 40 MCG/ML (10ML) SYRINGE FOR IV PUSH (FOR BLOOD PRESSURE SUPPORT)
PREFILLED_SYRINGE | INTRAVENOUS | Status: DC | PRN
  Administered 2020-11-01 (×2): 80 ug via INTRAVENOUS

## 2020-11-01 MED ORDER — IOHEXOL 350 MG/ML SOLN
100.0000 mL | Freq: Once | INTRAVENOUS | Status: AC | PRN
  Administered 2020-11-01: 100 mL via INTRAVENOUS

## 2020-11-01 MED ORDER — INSULIN ASPART 100 UNIT/ML ~~LOC~~ SOLN
8.0000 [IU] | Freq: Once | SUBCUTANEOUS | Status: AC
  Administered 2020-11-01: 8 [IU] via SUBCUTANEOUS

## 2020-11-01 MED ORDER — INSULIN GLARGINE 100 UNIT/ML ~~LOC~~ SOLN
5.0000 [IU] | Freq: Every day | SUBCUTANEOUS | Status: DC
  Administered 2020-11-01 – 2020-11-02 (×2): 5 [IU] via SUBCUTANEOUS
  Filled 2020-11-01 (×2): qty 0.05

## 2020-11-01 MED ORDER — POTASSIUM PHOSPHATES 15 MMOLE/5ML IV SOLN
30.0000 mmol | Freq: Once | INTRAVENOUS | Status: DC
  Filled 2020-11-01: qty 10

## 2020-11-01 MED ORDER — EPHEDRINE SULFATE-NACL 50-0.9 MG/10ML-% IV SOSY
PREFILLED_SYRINGE | INTRAVENOUS | Status: DC | PRN
  Administered 2020-11-01 (×2): 5 mg via INTRAVENOUS

## 2020-11-01 SURGICAL SUPPLY — 14 items

## 2020-11-01 NOTE — Progress Notes (Addendum)
[facility] Consult: 9:03 AM 11/01/2020  LOS: 0 days    Referring Provider: Dr Owens Shark  Primary Care Physician:  [name], MD Primary Gastroenterologist:  Althia Forts.  inpt GI consult w [facility] in 2016.    Reason for Consultation:  Dark stools, cirrhosis.     HPI: Jeffrey [name] is a 55 y.o. male.  PMH alcoholism.  Esophagitis.  Anemia.  Transfusions of 4 PRBCs in 11/2012 cirrhosis of the liver.  DM2.  OSA.  Anoxic encephalopathy following PEA cardiac arrest 2014.  Peripheral neuropathy  11/2012 EGD. For coffee-ground emesis, anemia.  Linear ulcer at EG junction.  Moderate hiatal hernia.  Otherwise normal study 10/2014 EGD.  Melena, hematemesis.  LA grade C esophagitis.  5 cm hiatal hernia.  Mild duodenitis. 06/2020 abdominal ultrasound: Coarse heterogeneous liver texture consistent with cirrhosis.  Gallbladder contraction and mild wall thickening, likely secondary to under distention.  PV patent with normal directional flow.  CBD 3 mm.  Presented to the ED yesterday afternoon with weakness, CGE, melena, unwitnessed fall, AMS. 1 week of dark stools, 5 days of black emesis.  No abdominal pain.  No dysphagia.  No heartburn.  Home meds include omeprazole 20 mg/day but has not taken most of his meds for at least a week.  Also takes low-dose aspirin daily, naproxen prn though unclear when he last took this. Temp 97.3.  Pulse low 100s to 1 teens.  Blood pressures low 100s-1 30s/60s to 90s. EtOH level 234. Hgb 12.4 >> 9.7.  Hgb  ~12 in 07/2020.  MCV 90.  Platelets normal.  WBCs 17 >> 14.4. INR 2.  FOBT positive. Sodium 134.  Glucose 434 >> 233. BUN 20. T bili 1.1.  Alk phos 168.  AST/ALT 31/31. COVID-19 negative. Head CT without acute changes. Meld score 22.   Admitting team initiated ceftriaxone, Protonix  drip, Sandostatin drip, vitamin K, Zofran  Drinks two 40 ounce beers daily.  Last drink on Saturday 1/22.   Lives with his wife.    Past Medical History:  Diagnosis Date  . Acute renal insufficiency 12/08/2012  . AKI (acute kidney injury) ([place])   . ALCOHOL ABUSE, HX OF 11/10/2007  . Anoxic encephalopathy ([place])   . Anxiety   . Bleeding ulcer 2014  . Blood transfusion 2014   "related to bleeding ulcer"  . Cardiac arrest ([place]) 12/07/2012   Anoxic encephalopathy  . GERD (gastroesophageal reflux disease)   . High cholesterol   . Hypertension   . OSA on CPAP 12/07/2012  . Seizure-like activity ([place])   . Type II diabetes mellitus ([place])   . Venous insufficiency 12/13/2011    Past Surgical History:  Procedure Laterality Date  . CARDIOVASCULAR STRESS TEST  03/16/13   Very Poor Exercise Tolerance; NON DIAGNOSTIC TEST  . CATARACT EXTRACTION W/ INTRAOCULAR LENS IMPLANT Left 03/07/2014   [place] @ [facility]  . ESOPHAGOGASTRODUODENOSCOPY Left 11/26/2012   Procedure: ESOPHAGOGASTRODUODENOSCOPY (EGD);  Surgeon: Wonda Horner, MD;  Location: [facility] ENDOSCOPY;  Service: Endoscopy;  Laterality: Left;  . ESOPHAGOGASTRODUODENOSCOPY N/A 10/10/2014  Procedure: ESOPHAGOGASTRODUODENOSCOPY (EGD);  Surgeon: Meryl Dare, MD;  Location: [facility] ENDOSCOPY;  Service: Endoscopy;  Laterality: N/A;  . KNEE ARTHROSCOPY Left ~ 1999    Prior to Admission medications   Medication Sig Start Date End Date Taking? Authorizing Provider  amLODipine (NORVASC) 10 MG tablet Take 1 tablet (10 mg total) by mouth daily. 07/26/20  Yes Burnadette Pop, MD  aspirin EC 81 MG tablet Take 1 tablet (81 mg total) by mouth at bedtime. Patient taking differently: Take 81 mg by mouth daily. 06/24/19  Yes Moses Manners, MD  atorvastatin (LIPITOR) 80 MG tablet TAKE 1 TABLET (80 MG TOTAL) BY MOUTH AT BEDTIME. 07/27/20  Yes Derrel Nip, MD  chlorthalidone (HYGROTON) 25 MG tablet Take 1 tablet (25 mg total) by mouth daily. 07/25/20  Yes  Burnadette Pop, MD  Continuous Blood Gluc Sensor (DEXCOM G6 SENSOR) MISC 1 Units by Does not apply route as directed. 09/26/20  Yes Derrel Nip, MD  Continuous Blood Gluc Transmit (DEXCOM G6 TRANSMITTER) MISC Use every 90 days 10/05/20  Yes Derrel Nip, MD  divalproex (DEPAKOTE ER) 500 MG 24 hr tablet Take 2 tablets (1,000 mg total) by mouth at bedtime. Patient taking differently: Take 1,000 mg by mouth daily. 03/16/20  Yes Levert Feinstein, MD  FARXIGA 10 MG TABS tablet Take 1 tablet (10 mg total) by mouth daily. Patient taking differently: Take 10 mg by mouth at bedtime. 07/25/20  Yes Burnadette Pop, MD  folic acid (FOLVITE) 1 MG tablet Take 1 tablet (1 mg total) by mouth daily. 07/04/20  Yes Derrel Nip, MD  gabapentin (NEURONTIN) 300 MG capsule Take 3 capsules (900 mg total) by mouth 3 (three) times daily. 09/12/20  Yes Derrel Nip, MD  glucose blood (TRUE METRIX BLOOD GLUCOSE TEST) test strip Please use to check blood sugar up to three times daily.  07/03/20  Yes Derrel Nip, MD  insulin aspart (FIASP FLEXTOUCH) 100 UNIT/ML FlexTouch Pen Inject 4-6 Units into the skin 3 (three) times daily after meals.   Yes [provider]  insulin glargine (LANTUS) 100 UNIT/ML Solostar Pen Inject 100 Units into the skin every morning. Patient taking differently: Inject 20 Units into the skin every morning. 08/08/20  Yes Derrel Nip, MD  Insulin Pen Needle (PEN NEEDLES 3/16") 31G X 5 MM MISC 1 each by Does not apply route in the morning and at bedtime. 07/25/20  Yes Burnadette Pop, MD  lidocaine-prilocaine (EMLA) cream Apply 1 application topically as needed.   Yes [provider]  losartan (COZAAR) 25 MG tablet Take 25 mg by mouth daily. 10/10/20  Yes [provider]  metFORMIN (GLUCOPHAGE-XR) 500 MG 24 hr tablet Take 1 tablet (500 mg total) by mouth daily with breakfast. 07/25/20  Yes Adhikari, Willia Craze, MD  metoprolol succinate (TOPROL XL) 50 MG 24 hr tablet Take  1 tablet (50 mg total) by mouth daily. 08/08/20 11/06/20 Yes Derrel Nip, MD  mirtazapine (REMERON) 30 MG tablet Take 1 tablet (30 mg total) by mouth at bedtime. 07/25/20  Yes Burnadette Pop, MD  naproxen (NAPROSYN) 500 MG tablet Take 1 tablet (500 mg total) by mouth every 12 (twelve) hours as needed. Patient taking differently: Take 500 mg by mouth every 12 (twelve) hours as needed for mild pain. 07/25/20  Yes Burnadette Pop, MD  omeprazole (PRILOSEC) 20 MG capsule Take 1 capsule (20 mg total) by mouth daily. 06/06/20  Yes Derrel Nip, MD  Potassium Chloride ER 20 MEQ TBCR Take 20 mEq by mouth daily. 10/16/20 11/15/20  Yes [name], MD  Semaglutide,0.25 or 0.5MG /DOS, (OZEMPIC, 0.25 OR 0.5 MG/DOSE,) 2 MG/1.5ML SOPN Inject 0.5 mg into the skin once a week. Patient taking differently: Inject 0.5 mg into the skin once a week. Friday 06/22/20  Yes Leavy Cella, RPH-CPP  sildenafil (VIAGRA) 100 MG tablet TAKE 1 TABLET BY MOUTH ONCE DAILY AS NEEDED FOR ERECTILE DYSFUNCTION Patient taking differently: Take 100 mg by mouth as needed for erectile dysfunction. 05/29/20  Yes [name], MD  thiamine (VITAMIN B-1) 100 MG tablet Take 1 tablet (100 mg total) by mouth daily. Patient taking differently: Take 250 mg by mouth daily. 05/27/16  Yes Gottschalk, Leatrice Jewels M, DO  torsemide (DEMADEX) 20 MG tablet Take 1 tablet (20 mg total) by mouth daily. 07/25/20  Yes Shelly Coss, MD  vitamin E 180 MG (400 UNITS) capsule Take 400 Units by mouth daily.   Yes [provider]  nystatin (MYCOSTATIN) 100000 UNIT/ML suspension Take 5 mLs (500,000 Units total) by mouth 4 (four) times daily. Patient not taking: No sig reported 07/25/20   Shelly Coss, MD    Scheduled Meds: . folic acid  1 mg Oral Daily  . insulin aspart  0-6 Units Subcutaneous TID WC  . insulin glargine  5 Units Subcutaneous Daily  . multivitamin with minerals  1 tablet Oral Daily  . thiamine injection  100 mg Intravenous  Daily   Infusions: . cefTRIAXone (ROCEPHIN)  IV Stopped (10/31/20 2019)  . lactated ringers 125 mL/hr at 11/01/20 0857  . magnesium sulfate bolus IVPB    . pantoprazole (PROTONIX) IV Stopped (10/31/20 2237)  . potassium PHOSPHATE IVPB (in mmol) 30 mmol (11/01/20 0902)   PRN Meds: dextrose, LORazepam **OR** LORazepam   Allergies as of 10/31/2020 - Review Complete 10/31/2020  Allergen Reaction Noted  . Lisinopril Other (See Comments) 11/30/2009  . Drug ingredient [spironolactone]  03/27/2020    Family History  Problem Relation Age of Onset  . Other Mother        Unsure of medical history.  . Diabetes Mellitus II Father     Social History   Socioeconomic History  . Marital status: Married    Spouse name: Not on file  . Number of children: 0  . Years of education: Bachelors  . Highest education level: Not on file  Occupational History  . Occupation: maintenance tech  Tobacco Use  . Smoking status: Never Smoker  . Smokeless tobacco: Current User    Types: Snuff  Substance and Sexual Activity  . Alcohol use: Yes    Alcohol/week: 6.0 standard drinks    Types: 6 Cans of beer per week  . Drug use: No  . Sexual activity: Yes  Other Topics Concern  . Not on file  Social History Narrative   Lives at home with wife. He has four stepchildren.    Right-handed.   No caffeine use.   Social Determinants of Health   Financial Resource Strain: Not on file  Food Insecurity: Not on file  Transportation Needs: Not on file  Physical Activity: Not on file  Stress: Not on file  Social Connections: Not on file  Intimate Partner Violence: Not on file    REVIEW OF SYSTEMS: Constitutional: Recent weakness making it difficult to stand up and subsequently fell at home. ENT:  No nose bleeds Pulm: Denies shortness of breath and cough. CV:  No palpitations, no LE edema.  No angina GU:  No hematuria, no frequency GI: See HPI. Heme: Patient denies excessive or unusual  bleeding or  bruising. Transfusions: See HPI. Neuro:  No headaches, no peripheral tingling or numbness Derm:  No itching, no rash or sores.  Endocrine:  No sweats or chills.  No polyuria or dysuria Immunization: Says he has been vaccinated for COVID-19 Travel:  None beyond local counties in last few months.    PHYSICAL EXAM: Vital signs in last 24 hours: Vitals:   11/01/20 0600 11/01/20 0645  BP: 102/68 108/62  Pulse: (!) 119 (!) 117  Resp: (!) 21 13  Temp:    SpO2: 96% 95%   Wt Readings from Last 3 Encounters:  10/31/20 101.6 kg  09/05/20 116.4 kg  08/10/20 113.9 kg    General: Pleasant, alert, comfortable.  Looks chronically ill but in no distress. Head: No facial asymmetry or swelling.  No signs of head trauma. Eyes: No conjunctival pallor Ears: Not hard of hearing Nose: No discharge or congestion Mouth: Edentulous in the upper jaw.  Mucosa are moist, pink, clear.  Tongue is midline. Neck: No JVD, no masses, no thyromegaly. Lungs: No labored breathing or cough.  Lungs clear bilaterally Heart: RRR.  No MRG.  S1, S2 present Abdomen: Soft without tenderness.  No distention.  Liver edge palpable just below costal margin on right.  No masses.  No tenderness.  Active bowel sounds..  GU: No scrotal or penile edema Rectal: Very dark brown stools were not hemocculted.  There is no blood in this does not look or smell like melena. Musc/Skeltl: No gross joint deformities. Extremities: Swelling in the right foot. Neurologic: Oriented to self, place but not year.  Able to explain recent symptoms and events.  No tremors.  Moves all 4 limbs, strength not tested Skin: No rash.  On the abdomen there are several scars in one area of denuded skin, no visible pustules. Nodes: No cervical adenopathy Psych: Operative, pleasant, calm.  Intake/Output from previous day: 01/25 0701 - 01/26 0700 In: 4671.5 [I.V.:739.5; IV Piggyback:3932] Out: -  Intake/Output this shift: No intake/output data  recorded.  LAB RESULTS: Recent Labs    10/31/20 1500 10/31/20 1515 10/31/20 2300 11/01/20 0300  WBC 17.0*  --  15.4* 14.4*  HGB 12.4* 14.3 10.5* 9.7*  HCT 38.3* 42.0 31.1* 28.5*  PLT 387  --  351 330   BMET Lab Results  Component Value Date   NA 134 (L) 11/01/2020   NA 135 10/31/2020   NA 136 10/31/2020   K 3.5 11/01/2020   K 3.0 (L) 10/31/2020   K 2.7 (LL) 10/31/2020   CL 89 (L) 11/01/2020   CL 89 (L) 10/31/2020   CL 89 (L) 10/31/2020   CO2 30 11/01/2020   CO2 31 10/31/2020   CO2 28 10/31/2020   GLUCOSE 233 (H) 11/01/2020   GLUCOSE 153 (H) 10/31/2020   GLUCOSE 199 (H) 10/31/2020   BUN 13 11/01/2020   BUN 14 10/31/2020   BUN 16 10/31/2020   CREATININE 1.11 11/01/2020   CREATININE 1.10 10/31/2020   CREATININE 1.18 10/31/2020   CALCIUM 7.6 (L) 11/01/2020   CALCIUM 7.6 (L) 10/31/2020   CALCIUM 7.3 (L) 10/31/2020   LFT Recent Labs    10/31/20 1500 10/31/20 2300 11/01/20 0300  PROT 6.2* 5.8* 5.1*  ALBUMIN 2.0* 1.8* 1.5*  AST 50* 31 27  ALT 38 31 29  ALKPHOS 192* 168* 150*  BILITOT 1.1 0.7 1.1   PT/INR Lab Results  Component Value Date   INR 2.0 (H) 10/31/2020   INR 1.5 (H) 07/22/2020   INR 1.1  06/29/2020   Hepatitis Panel No results for input(s): HEPBSAG, HCVAB, HEPAIGM, HEPBIGM in the last 72 hours. C-Diff No components found for: CDIFF Lipase     Component Value Date/Time   LIPASE 17 11/06/2015 0655    Drugs of Abuse     Component Value Date/Time   LABOPIA NONE DETECTED 06/28/2020 1811   COCAINSCRNUR NONE DETECTED 06/28/2020 1811   LABBENZ NONE DETECTED 06/28/2020 1811   AMPHETMU NONE DETECTED 06/28/2020 1811   THCU NONE DETECTED 06/28/2020 1811   LABBARB NONE DETECTED 06/28/2020 1811     RADIOLOGY STUDIES: CT Head Wo Contrast  Result Date: 10/31/2020 CLINICAL DATA:  Mental status change, unknown cause EXAM: CT HEAD WITHOUT CONTRAST TECHNIQUE: Contiguous axial images were obtained from the base of the skull through the vertex without  intravenous contrast. COMPARISON:  Head CT June 28, 2020 and MRI brain August 27, 2016 FINDINGS: Brain: No evidence of acute infarction, hemorrhage, hydrocephalus, extra-axial collection or mass lesion/mass effect. Similar mild diffuse parenchymal atrophy with ex vacuo dilatation of ventricular system. Vascular: No hyperdense vessel or unexpected calcification. Intracranial atherosclerosis. Skull: Normal. Negative for fracture or focal lesion. Sinuses/Orbits: No acute finding. Other: None. IMPRESSION: 1. No acute intracranial pathology. 2. Similar mild diffuse parenchymal atrophy with ex vacuo dilatation of ventricular system. Electronically Signed   By: Dahlia Bailiff MD   On: 10/31/2020 15:42   DG Chest Port 1 View  Result Date: 10/31/2020 CLINICAL DATA:  Altered level of consciousness, weakness, lethargy, melena, un witnessed fall EXAM: PORTABLE CHEST 1 VIEW COMPARISON:  04/03/2020 FINDINGS: Single frontal view of the chest demonstrates an unremarkable cardiac silhouette. No airspace disease, effusion, or pneumothorax. No acute bony abnormalities. IMPRESSION: 1. No acute intrathoracic process. Electronically Signed   By: Randa Ngo M.D.   On: 10/31/2020 15:09     IMPRESSION:   *    Cirrhosis of the liver, presumed due to alcohol. Elevated alcohol level.  Says he last drank 5 days ago.  *    Acute on chronic anemia.  About a weeks worth of dark stools and 5 days of dark emesis. FOBT positive. Esophagitis, hiatal hernia on EGD's in 2014 and 2016. Rule out portal hypertensive gastropathy, gastric and esophageal varices.  *     COVID-19 negative.  PLAN:     *     EGD, timing later this morning or early this afternoon..  Patient is n.p.o. he is agreeable to proceed with EGD, recalling previous endoscopies several years ago.  *    Continue the PPI drip, octreotide drip.  Serial CBCs, transfuse as needed   Azucena Freed  11/01/2020, 9:03 AM Phone [phone]  GI  ATTENDING  History, laboratories, x-rays, prior endoscopy reports reviewed.  Patient seen and examined.  Chronic alcoholic who presents to the emergency room intoxicated with 5 days of subacute upper GI bleeding.  He has a history of alcoholic liver disease.  Cirrhosis on x-ray and esophagitis on endoscopy but no evidence of portal hypertension on prior endoscopy or x-rays.  Platelets and bilirubin are normal.  Elevated INR noted (question nutrition).  Plans for urgent upper endoscopy with endoscopic hemostatic therapy as indicated.  The patient is HIGH RISK.The nature of the procedure, as well as the risks, benefits, and alternatives were carefully and thoroughly reviewed with the patient. Ample time for discussion and questions allowed. The patient understood, was satisfied, and agreed to proceed.  Docia Chuck. Geri Seminole., M.D. [facility] Division of Gastroenterology

## 2020-11-01 NOTE — Transfer of Care (Signed)
Immediate Anesthesia Transfer of Care Note  Patient: Jeffrey [name]  Procedure(s) Performed: ESOPHAGOGASTRODUODENOSCOPY (EGD) WITH PROPOFOL (N/A ) BIOPSY  Patient Location: PACU  Anesthesia Type:General  Level of Consciousness: awake, alert , oriented and patient cooperative  Airway & Oxygen Therapy: Patient Spontanous Breathing and Patient connected to face mask oxygen  Post-op Assessment: Report given to RN and Post -op Vital signs reviewed and stable  Post vital signs: Reviewed and stable  Last Vitals:  Vitals Value Taken Time  BP    Temp    Pulse    Resp    SpO2      Last Pain:  Vitals:   11/01/20 1145  TempSrc: Axillary  PainSc: 0-No pain         Complications: No complications documented.

## 2020-11-01 NOTE — Progress Notes (Signed)
Attending Daily Note  S: Into see patient this morning after overnight admission for symptomatic hypoglycemia, suspected GI bleed and altered mental status.  This morning the patient is alert and appropriate.  Mental status is similar if slightly improved yesterday.  He is alert to person place and situation.  Still does not know date.  He did not use his CPAP last night.  He reports overall he feels slightly better but still feels some nausea and mild RUQ pain.  He has had no more emesis or melena overnight.  Objectively still tachycardic but hemodynamically stable.  Alert oriented and appropriate extremities warm and well perfused cardiac exam is tachycardic but a regular rate.  Abdomen is soft with minimally tender epigastric area and slightly tender right upper quadrant.  No rebound or guarding  Labs reviewed this morning and notable for the following Anion gap is now closed, hyperglycemia slightly improved.  Alkaline phosphatase trending down.  Lactate 1.5.  White blood cells slightly lower at 14.4 hemoglobin dropped significantly from 12-9.7 with fluids.  Will need to obtain INR this morning as the patient received vitamin K yesterday.   Plan: -Acute gastrointestinal bleeding, the most suspicious for upper GI bleed appreciate gastroenterology consultation and care.  They were notified yesterday of the patient.  Continue octreotide, pantoprazole.  Given abdominal pain will obtain right upper quadrant ultrasound, could consider CT scan if clinically worsening. -For cirrhosis added on repeat hepatitis C.  hepatitis B serologies, will check for hepatitis A immunity. -Persistent tachycardia, likely due to anemia and hyperglycemia.  The patient also takes metoprolol at home and this could be rebound tachycardia.  Continue telemetry and close monitoring and IV fluids at this time.  If not improving could consider other causes but this time I suspect this is most likely due to his anemia.   The patient  was signed out to Dr. Gwendlyn Deutscher.   [name], MD  Family Medicine Teaching Service

## 2020-11-01 NOTE — Progress Notes (Addendum)
Interim progress note  Patient 11 PM CMP returned with anion gap normal at 15, potassium improved from 2.7 up to 3.  Patient's most recent glucose much improved at 133 (on admission was 465).  We will transition patient off of Endo tool. After speaking with pharmacy with also opt to stop patient's D5-lactated Ringer as well as his insulin drip.  As CBG is only 133, and patient is n.p.o., will not give subcutaneous insulin at this time.  Will continue him on just IV LR at 125cc/hr. patient has two more rounds of IV potassium to be given, will schedule patient's morning CMP for little bit earlier at 3 AM to recheck anion gap, potassium, and LA.  Continue CBGs.  Jeffrey [name], [place], PGY-3 11/01/2020 12:58 AM

## 2020-11-01 NOTE — Progress Notes (Signed)
Patient to 4E01 from Endo. Vital signs obtained. On monitor CCMD notified. Alert and oriented to room and call light. Call bell within reach. Will continue to monitor.  Era Bumpers, RN

## 2020-11-01 NOTE — ED Notes (Signed)
Spoke with admitting, pt gap is closed, d/c insulin and D5 and change to fluid to LR due to risk for continued hypokalemia and pt hx of cardiac events.

## 2020-11-01 NOTE — Op Note (Signed)
[facility] Patient Name: Jeffrey [name] Procedure Date : 11/01/2020 MRN: [id number] Attending MD: Docia Chuck. Henrene Pastor , MD Date of Birth: March 12, 1966 CSN: [id number] Age: 55 Admit Type: Outpatient Procedure:                Upper GI endoscopy with biopsies Indications:              Acute post hemorrhagic anemia, Coffee-ground                            emesis, Melena Providers:                Docia Chuck. Henrene Pastor, MD, Jeanella Cara, RN,                            Ladona Ridgel, Technician, Reather Laurence, CRNA Referring MD:             [facility] hospitalist Medicines:                Monitored Anesthesia Care Complications:            No immediate complications. Estimated Blood Loss:     Estimated blood loss: none. Procedure:                Pre-Anesthesia Assessment:                           - Prior to the procedure, a History and Physical                            was performed, and patient medications and                            allergies were reviewed. The patient's tolerance of                            previous anesthesia was also reviewed. The risks                            and benefits of the procedure and the sedation                            options and risks were discussed with the patient.                            All questions were answered, and informed consent                            was obtained. Prior Anticoagulants: The patient has                            taken no previous anticoagulant or antiplatelet                            agents. ASA Grade Assessment: II - A patient with  mild systemic disease. After reviewing the risks                            and benefits, the patient was deemed in                            satisfactory condition to undergo the procedure.                           After obtaining informed consent, the endoscope was                            passed under direct vision. Throughout the                             procedure, the patient's blood pressure, pulse, and                            oxygen saturations were monitored continuously. The                            GIF-H190 ([id number] Olympus gastroscope was                            introduced through the mouth, and advanced to the                            third part of duodenum. The upper GI endoscopy was                            accomplished without difficulty. The patient                            tolerated the procedure well. Scope In: Scope Out: Findings:      Severe reflux related erosive esophagitis was found in the distal one       half of the esophagus. No active bleeding. No varices      The stomach revealed some patchy erythema and a few superficial erosions       but was otherwise normal. No proximal gastric varices or portal       hypertensive gastropathy. Biopsies were taken with a cold forceps from       the gastric antrum for histology.      There was severe ulceration of the duodenum beginning in the most distal       bulb and extending to the third portion. The ulcers were multiple and       linear. There was a black eschar. The appearance was consistent with       ischemia (as opposed to classic acid peptic). Biopsies were taken with a       cold forceps for histology. There was no active bleeding or high       bleeding stigmata      The cardia and gastric fundus were normal on retroflexion. Impression:               1. Severe erosive esophagitis  2. Mild gastritis                           3. No varices. No portal hypertensive gastropathy                           4. Duodenal ulceration as described. Changes                            suggest ischemia of some sort                           5. No active bleeding or high risk stigmata. Recommendation:           1. Hydration and correction of any electrolyte                            abnormalities                            2. IV PPI                           3. Antinausea medicine as needed                           4. Follow-up biopsies                           5. Clear liquid diet                           6. Schedule CT angiogram of the abdomen "diffuse                            post bulbar duodenal ulceration, rule out ischemia".                           7. Stop drinking alcohol forever                           8. Watch for DTs or alcohol withdrawal                           GI will follow. Procedure Code(s):        --- Professional ---                           [phone], Esophagogastroduodenoscopy, flexible,                            transoral; with biopsy, single or multiple Diagnosis Code(s):        --- Professional ---                           K21.00, Gastro-esophageal reflux disease with  esophagitis, without bleeding                           K26.9, Duodenal ulcer, unspecified as acute or                            chronic, without hemorrhage or perforation                           D62, Acute posthemorrhagic anemia                           K92.0, Hematemesis                           K92.1, Melena (includes Hematochezia) CPT copyright 2019 American Medical Association. All rights reserved. The codes documented in this report are preliminary and upon coder review may  be revised to meet current compliance requirements. Docia Chuck. Henrene Pastor, MD 11/01/2020 12:56:47 PM This report has been signed electronically. Number of Addenda: 0

## 2020-11-01 NOTE — Hospital Course (Addendum)
Tasks: Pantoprazole 40mg  BID x4 weeks, then 40mg  daily indefinitely F/u egd biopsies

## 2020-11-01 NOTE — Progress Notes (Signed)
Pt's blood sugar 378. Called on call MD, talked to Dr. Elberta Fortis, MD said its ok to give 2200 sliding scale as pt has received steroids prior. Recheck blood sugar around 0130 again. Will continue to monitor.

## 2020-11-01 NOTE — Anesthesia Postprocedure Evaluation (Signed)
Anesthesia Post Note  Patient: Jeffrey [name]  Procedure(s) Performed: ESOPHAGOGASTRODUODENOSCOPY (EGD) WITH PROPOFOL (N/A ) BIOPSY     Patient location during evaluation: Endoscopy Anesthesia Type: General Level of consciousness: awake and alert Pain management: pain level controlled Vital Signs Assessment: post-procedure vital signs reviewed and stable Respiratory status: spontaneous breathing, nonlabored ventilation, respiratory function stable and patient connected to nasal cannula oxygen Cardiovascular status: blood pressure returned to baseline and stable Postop Assessment: no apparent nausea or vomiting Anesthetic complications: no   No complications documented.  Last Vitals:  Vitals:   11/01/20 1310 11/01/20 1330  BP: 138/81 127/65  Pulse: 98 97  Resp: (!) 28 (!) 25  Temp:    SpO2: 92% 95%    Last Pain:  Vitals:   11/01/20 1330  TempSrc:   PainSc: 0-No pain                 Catalina Gravel

## 2020-11-01 NOTE — Progress Notes (Signed)
CRITICAL VALUE ALERT  Critical Value:  Blood sugar 418   Date & Time Notied:  11/01/20  Provider Notified: Dr. Delfino Lovett  Orders Received/Actions taken: yes. see MAR.

## 2020-11-01 NOTE — Plan of Care (Signed)

## 2020-11-01 NOTE — Progress Notes (Signed)
Interim progress note  DM noted to be very elevated at 418, to treat this patient received 8 units of insulin aspart.  Upon recheck a couple hours later patient's CBG still elevated at 378.  Upon chart review patient received 5 mg of dexamethasone during his procedure earlier today around 1230, which explains his acutely elevated blood sugar.  Patient received additional nighttime insulin coverage.  Plan to recheck CBG around 1:30 AM 1/27.  Milus Banister, [place], PGY-3 11/01/2020 11:32 PM

## 2020-11-01 NOTE — Anesthesia Preprocedure Evaluation (Addendum)
Anesthesia Evaluation  Patient identified by MRN, date of birth, ID band Patient awake    Reviewed: Allergy & Precautions, NPO status , Patient's Chart, lab work & pertinent test results, reviewed documented beta blocker date and time   Airway Mallampati: III  TM Distance: >3 FB Neck ROM: Full    Dental  (+) Dental Advisory Given, Edentulous Upper   Pulmonary sleep apnea and Continuous Positive Airway Pressure Ventilation ,    Pulmonary exam normal breath sounds clear to auscultation       Cardiovascular hypertension, Pt. on medications and Pt. on home beta blockers  Rhythm:Regular Rate:Tachycardia     Neuro/Psych Seizures -,  PSYCHIATRIC DISORDERS Anxiety Depression    GI/Hepatic PUD, GERD  Medicated,(+) Cirrhosis     substance abuse  alcohol use, Dark stools, FOBT positive.  Dark emesis.   Endo/Other  diabetes, Type 2, Oral Hypoglycemic Agents  Renal/GU Renal InsufficiencyRenal disease     Musculoskeletal negative musculoskeletal ROS (+)   Abdominal   Peds  Hematology  (+) Blood dyscrasia, anemia ,   Anesthesia Other Findings Day of surgery medications reviewed with the patient.  Reproductive/Obstetrics                            Anesthesia Physical Anesthesia Plan  ASA: III  Anesthesia Plan: General   Post-op Pain Management:    Induction: Intravenous  PONV Risk Score and Plan: 2 and Propofol infusion, Dexamethasone and Ondansetron  Airway Management Planned: Oral ETT and Video Laryngoscope Planned  Additional Equipment:   Intra-op Plan:   Post-operative Plan:   Informed Consent: I have reviewed the patients History and Physical, chart, labs and discussed the procedure including the risks, benefits and alternatives for the proposed anesthesia with the patient or authorized representative who has indicated his/her understanding and acceptance.     Dental advisory  given  Plan Discussed with: CRNA  Anesthesia Plan Comments:        Anesthesia Quick Evaluation

## 2020-11-01 NOTE — Progress Notes (Signed)
Family Medicine Teaching Service Daily Progress Note Intern Pager: [phone]  Patient name: Jeffrey [name] Medical record number: [id number] Date of birth: 1966/05/21 Age: 55 y.o. Gender: male  Primary Care Provider: [name], MD Consultants: GI Code Status: Full  Pt Overview and Major Events to Date:  1/25: Admitted  Assessment and Plan:  Jeffrey [name] is a 55 y.o. male presenting with ~5 days of hematemesis and hematochezia. PMH is significant for cirrhosis, alcohol use disorder, T2DM, anoxic brain injury s/p cardiac arrest, HTN, HLD, OSA, CKD, seizures.  GI bleed  Hx of cirrhosis Hemoglobin 9.7 this morning, from 12.4 on admission. He remains tachycardic, BP normotensive but on lower end, most recent reading 108/62.  No episodes of hematemesis or hematochezia overnight. No prior documented hx of varices. -GI following, appreciate recommendations  -Plan for EGD today -Continue IV Protonix 80 mg BID -Continue octreotide 55mg/hr -Continue ceftriaxone 1g (1/25- ) -Will obtain RUQ ultrasound and hepatitis serologies -s/p Vit K 170mPO per GI -Daily CBC, BMP  Hyperglycemia with anion gap (resolved)  T2DM Anion gap closed overnight so patient was transitioned off insulin gtt. Glucose elevated to 282 this morning.  Home meds: Farxiga, sliding scale, Lantus, Metformin -Will obtain Hgb A1c -Lantus 5u this morning -SSI -Close CBG monitoring  Hypophosphatemia  Hypomagnesemia Phos 1.1, Mag 1.6. -Will replete with 2g IV Mag -Potassium phos 30 mmol -Will monitor glucose closely (since Kphos is administered in D5) -Recheck renal function panel, mag at 1600  SIRS criteria  Leukocytosis Patient met SIRS criteria on admission due to tachycardia and leukocytosis.  UA and chest x-ray negative for source of infection. Suspect his tachycardia is related to active GI bleed, rebound tachycardia (patient not currently receiving home metoprolol), possible impending withdrawal.   Less likely infectious, but will continue to monitor for infectious etiology -Follow-up blood culture results -Daily CBC  Alcohol use disorder Ethanol level 234 on admission. Patient has prior history of withdrawal with seizure-like activity.  CIWA scores 0 overnight. -CIWA monitoring with Ativan -Thiamine, folic acid  Seizures Patient has a history of seizures, unclear whether these are related to alcohol use or true seizure disorder. Has previously been treated with Depakote and Keppra. Was supposed to taper off Depakote and start Lamictal but never did so. -Continue CIWA with Ativan -IV Keppra 50030mID  Hypertension Patient has been normotensive since admission. Home meds: Amlodipine 10 mg daily, chlorthalidone 25 mg daily, losartan 25 mg daily -Holding home meds in the setting of normal BP and NPO -Monitor BP  OSA Chronic, stable. On CPAP at home -CPAP/BiPAP nightly while admitted  CKD Creatinine 1.11 this morning. -Continue to monitor BMP   FEN/GI: NPO (for possible GI procedural intervention) PPx: SCDs   Status is: Observation The patient will require care spanning > 2 midnights and should be moved to inpatient because: Ongoing diagnostic testing needed not appropriate for outpatient work up  Dispo: The patient is from: Home              Anticipated d/c is to: TBD              Anticipated d/c date is: 3 days              Patient currently is not medically stable to d/c.   Difficult to place patient No    Subjective:  No acute events overnight.  Patient denies any ongoing vomiting or bloody bowel movements.  Still has nausea.  Objective: Temp:  [97.3 F (36.3  C)] 97.3 F (36.3 C) (01/25 1456) Pulse Rate:  [98-119] 104 (01/26 1030) Resp:  [10-22] 19 (01/26 1030) BP: (101-145)/(57-97) 105/85 (01/26 1030) SpO2:  [95 %-100 %] 96 % (01/26 1030) Weight:  [101.6 kg] 101.6 kg (01/25 1457) Physical Exam: General: Alert, NAD Cardiovascular: Tachycardic, regular  rhythm, normal S1/S2 Respiratory: Normal WOB on room air lungs CTAB Abdomen: Soft, minimal RUQ tenderness, nondistended Neuro: Alert and oriented x3, grossly intact   Laboratory: Recent Labs  Lab 10/31/20 1500 10/31/20 1515 10/31/20 2300 11/01/20 0300  WBC 17.0*  --  15.4* 14.4*  HGB 12.4* 14.3 10.5* 9.7*  HCT 38.3* 42.0 31.1* 28.5*  PLT 387  --  351 330   Recent Labs  Lab 10/31/20 1500 10/31/20 1515 10/31/20 2013 10/31/20 2300 11/01/20 0300  NA 132*   < > 136 135 134*  K 3.4*   < > 2.7* 3.0* 3.5  CL 82*   < > 89* 89* 89*  CO2 24   < > _0 BUN 19   < > _1 CREATININE 1.53*   < > 1.18 1.10 1.11  CALCIUM 7.7*   < > 7.3* 7.6* 7.6*  PROT 6.2*  --   --  5.8* 5.1*  BILITOT 1.1  --   --  0.7 1.1  ALKPHOS 192*  --   --  168* 150*  ALT 38  --   --  31 29  AST 50*  --   --  31 27  GLUCOSE 465*   < > 199* 153* 233*   < > = values in this interval not displayed.    Imaging/Diagnostic Tests: No new imaging since admission.   Alcus Dad, MD 11/01/2020, 11:08 AM PGY-1, [place] Intern pager: [phone], text pages welcome

## 2020-11-01 NOTE — Anesthesia Procedure Notes (Addendum)
Procedure Name: Intubation Performed by: Janene Harvey, CRNA Pre-anesthesia Checklist: Patient identified, Emergency Drugs available, Suction available and Patient being monitored Patient Re-evaluated:Patient Re-evaluated prior to induction Oxygen Delivery Method: Circle system utilized Preoxygenation: Pre-oxygenation with 100% oxygen Induction Type: IV induction and Rapid sequence Laryngoscope Size: Mac and 4 Grade View: Grade I Tube type: Oral Tube size: 7.0 mm Number of attempts: 1 Airway Equipment and Method: Stylet and Oral airway Placement Confirmation: ETT inserted through vocal cords under direct vision,  positive ETCO2 and breath sounds checked- equal and bilateral Secured at: 23 cm Tube secured with: Tape Dental Injury: Teeth and Oropharynx as per pre-operative assessment

## 2020-11-02 ENCOUNTER — Encounter (HOSPITAL_COMMUNITY): Payer: Self-pay | Admitting: Internal Medicine

## 2020-11-02 ENCOUNTER — Other Ambulatory Visit: Payer: Self-pay | Admitting: Physician Assistant

## 2020-11-02 DIAGNOSIS — F10129 Alcohol abuse with intoxication, unspecified: Secondary | ICD-10-CM | POA: Diagnosis not present

## 2020-11-02 DIAGNOSIS — R739 Hyperglycemia, unspecified: Secondary | ICD-10-CM

## 2020-11-02 DIAGNOSIS — K264 Chronic or unspecified duodenal ulcer with hemorrhage: Secondary | ICD-10-CM | POA: Diagnosis not present

## 2020-11-02 DIAGNOSIS — D62 Acute posthemorrhagic anemia: Secondary | ICD-10-CM | POA: Diagnosis not present

## 2020-11-02 DIAGNOSIS — K920 Hematemesis: Secondary | ICD-10-CM | POA: Diagnosis not present

## 2020-11-02 LAB — COMPREHENSIVE METABOLIC PANEL
ALT: 29 U/L (ref 0–44)
AST: 21 U/L (ref 15–41)
Albumin: 1.7 g/dL — ABNORMAL LOW (ref 3.5–5.0)
Alkaline Phosphatase: 138 U/L — ABNORMAL HIGH (ref 38–126)
Anion gap: 11 (ref 5–15)
BUN: 15 mg/dL (ref 6–20)
CO2: 32 mmol/L (ref 22–32)
Calcium: 8.2 mg/dL — ABNORMAL LOW (ref 8.9–10.3)
Chloride: 90 mmol/L — ABNORMAL LOW (ref 98–111)
Creatinine, Ser: 1.41 mg/dL — ABNORMAL HIGH (ref 0.61–1.24)
GFR, Estimated: 58 mL/min — ABNORMAL LOW (ref >60)
Glucose, Bld: 225 mg/dL — ABNORMAL HIGH (ref 70–99)
Potassium: 3.4 mmol/L — ABNORMAL LOW (ref 3.5–5.1)
Sodium: 133 mmol/L — ABNORMAL LOW (ref 135–145)
Total Bilirubin: 0.5 mg/dL (ref 0.3–1.2)
Total Protein: 5.8 g/dL — ABNORMAL LOW (ref 6.5–8.1)

## 2020-11-02 LAB — CBC
HCT: 30.1 % — ABNORMAL LOW (ref 39.0–52.0)
Hemoglobin: 10.2 g/dL — ABNORMAL LOW (ref 13.0–17.0)
MCH: 31.1 pg (ref 26.0–34.0)
MCHC: 33.9 g/dL (ref 30.0–36.0)
MCV: 91.8 fL (ref 80.0–100.0)
Platelets: 350 10*3/uL (ref 150–400)
RBC: 3.28 MIL/uL — ABNORMAL LOW (ref 4.22–5.81)
RDW: 17.1 % — ABNORMAL HIGH (ref 11.5–15.5)
WBC: 12 10*3/uL — ABNORMAL HIGH (ref 4.0–10.5)
nRBC: 0.2 % (ref 0.0–0.2)

## 2020-11-02 LAB — GLUCOSE, CAPILLARY
Glucose-Capillary: 277 mg/dL — ABNORMAL HIGH (ref 70–99)
Glucose-Capillary: 212 mg/dL — ABNORMAL HIGH (ref 70–99)
Glucose-Capillary: 257 mg/dL — ABNORMAL HIGH (ref 70–99)
Glucose-Capillary: 224 mg/dL — ABNORMAL HIGH (ref 70–99)
Glucose-Capillary: 124 mg/dL — ABNORMAL HIGH (ref 70–99)

## 2020-11-02 LAB — PHOSPHORUS: Phosphorus: 2.3 mg/dL — ABNORMAL LOW (ref 2.5–4.6)

## 2020-11-02 LAB — MAGNESIUM: Magnesium: 1.8 mg/dL (ref 1.7–2.4)

## 2020-11-02 LAB — HEPATITIS B CORE ANTIBODY, TOTAL: Hep B Core Total Ab: NONREACTIVE

## 2020-11-02 LAB — HEPATITIS B SURFACE ANTIGEN: Hepatitis B Surface Ag: NONREACTIVE

## 2020-11-02 LAB — HEPATITIS A ANTIBODY, TOTAL: hep A Total Ab: NONREACTIVE

## 2020-11-02 MED ORDER — LEVETIRACETAM 500 MG PO TABS
500.0000 mg | ORAL_TABLET | Freq: Two times a day (BID) | ORAL | Status: DC
  Administered 2020-11-02 – 2020-11-04 (×4): 500 mg via ORAL
  Filled 2020-11-02 (×4): qty 1

## 2020-11-02 MED ORDER — INSULIN GLARGINE 100 UNIT/ML ~~LOC~~ SOLN
10.0000 [IU] | Freq: Every day | SUBCUTANEOUS | Status: DC
  Filled 2020-11-02: qty 0.1

## 2020-11-02 MED ORDER — METOPROLOL SUCCINATE ER 50 MG PO TB24
50.0000 mg | ORAL_TABLET | Freq: Every day | ORAL | Status: DC
  Administered 2020-11-02 – 2020-11-04 (×3): 50 mg via ORAL
  Filled 2020-11-02 (×3): qty 1

## 2020-11-02 MED ORDER — INSULIN GLARGINE 100 UNIT/ML ~~LOC~~ SOLN
5.0000 [IU] | Freq: Once | SUBCUTANEOUS | Status: AC
  Administered 2020-11-02: 5 [IU] via SUBCUTANEOUS
  Filled 2020-11-02: qty 0.05

## 2020-11-02 MED ORDER — ENOXAPARIN SODIUM 40 MG/0.4ML ~~LOC~~ SOLN
40.0000 mg | SUBCUTANEOUS | Status: DC
  Administered 2020-11-02 – 2020-11-04 (×3): 40 mg via SUBCUTANEOUS
  Filled 2020-11-02 (×3): qty 0.4

## 2020-11-02 MED ORDER — ATORVASTATIN CALCIUM 80 MG PO TABS
80.0000 mg | ORAL_TABLET | Freq: Every day | ORAL | Status: DC
  Administered 2020-11-02 – 2020-11-03 (×2): 80 mg via ORAL
  Filled 2020-11-02 (×2): qty 1

## 2020-11-02 MED ORDER — THIAMINE HCL 100 MG PO TABS
100.0000 mg | ORAL_TABLET | Freq: Every day | ORAL | Status: DC
  Administered 2020-11-03 – 2020-11-04 (×2): 100 mg via ORAL
  Filled 2020-11-02 (×2): qty 1

## 2020-11-02 MED ORDER — AMLODIPINE BESYLATE 10 MG PO TABS
10.0000 mg | ORAL_TABLET | Freq: Every day | ORAL | Status: DC
  Administered 2020-11-02: 10 mg via ORAL
  Filled 2020-11-02: qty 1

## 2020-11-02 MED ORDER — PANTOPRAZOLE SODIUM 40 MG PO TBEC
40.0000 mg | DELAYED_RELEASE_TABLET | Freq: Two times a day (BID) | ORAL | Status: DC
  Administered 2020-11-02 – 2020-11-04 (×4): 40 mg via ORAL
  Filled 2020-11-02 (×4): qty 1

## 2020-11-02 NOTE — Progress Notes (Signed)
Family Medicine Teaching Service Daily Progress Note Intern Pager: [phone]  Patient name: Jeffrey [name] Medical record number: [id number] Date of birth: 02/07/1966 Age: 55 y.o. Gender: male  Primary Care Provider: [name], MD Consultants: GI Code Status: Full  Pt Overview and Major Events to Date:  1/25: Admitted 1/26: EGD performed  Assessment and Plan:  Jeffrey [name] is a 55 y.o. male presenting with ~5 days of hematemesis and hematochezia. PMH is significant for cirrhosis, alcohol use disorder, T2DM, anoxic brain injury s/p cardiac arrest, HTN, HLD, OSA, CKD, seizures.  Erosive Esophagitis  Duodenal Ulceration Seen on EGD yesterday. CTA showed no vascular abnormalities and no active GI bleeding, but demonstrated duodenitis, chronic pancreatitis, and hepatic steatosis. -GI following, appreciate recommendations -Protonix 40mg  PO BID -Clear liquid diet, advance as tolerated  Cirrhosis  Alcohol Use disorder RUQ [name] on 1/26 consistent with cirrhosis. No varices or portal hypertensive gastropathy seen on EGD. CIWA score 0 this morning. -Ongoing alcohol cessation counseling -Patient expresses interest in medication to reduce cravings -CIWA monitoring with Ativan as needed -Daily thiamine, folic acid -d/c'd octreotide and ceftriaxone  Hyperglycemia  T2DM Glucose elevated to 418 overnight but is improved at 212 this morning (s/p 19u SAI yesterday). Patient did receive dexamethasone for his procedure yesterday, and KPhos was administered in D5. A1c obtained yesterday was 6.9%. Home meds: Farxiga, sliding scale, Lantus 20u, Metformin -Lantus 5u daily (will give an additional 5u now and increase to 10u starting tomorrow am) -SSI -Close CBG monitoring  Hypophosphatemia  Hypomagnesemia- resolved Phos 2.3 this morning (from 1.1 previously). Mag wnl at 1.8 this morning. -s/p KPhos 250mg  PO x1 -Monitor BMP, phos, mag  Seizures Patient has a history of seizures,  unclear whether these are related to alcohol use or true seizure disorder. Has previously been treated with Depakote and Keppra. Was supposed to taper off Depakote and start Lamictal but never did so. -Continue CIWA with Ativan -Keppra 500mg  BID (will change to PO today)  Hypertension Patient has been normotensive since admission- his home meds were held due to NPO status prior to EGD. Home meds: Amlodipine 10 mg daily, chlorthalidone 25 mg daily, losartan 25 mg daily -Restart home BP meds -Monitor BP  OSA Chronic, stable. On CPAP at home -CPAP/BiPAP nightly while admitted  CKD Creatinine 1.11 this morning. -Continue to monitor BMP   FEN/GI: Clear liquid diet PPx: SCDs   Status is: Inpatient Remains inpatient appropriate because: Inpatient level of care appropriate due to severity of illness  Dispo: The patient is from: Home              Anticipated d/c is to: TBD              Anticipated d/c date is: 1 day              Patient currently is not medically stable to d/c.   Difficult to place patient No   Subjective:  No acute events overnight. Patient still with slight nausea sensation. No further vomiting or bloody bowel movements. He has no other complaints at this time.  Objective: Temp:  [97.7 F (36.5 C)-98.5 F (36.9 C)] 97.9 F (36.6 C) (01/27 0348) Pulse Rate:  [86-117] 86 (01/27 0348) Resp:  [10-28] 20 (01/27 0348) BP: (105-154)/(62-98) 128/91 (01/27 0348) SpO2:  [92 %-100 %] 96 % (01/27 0348) Weight:  [112.5 kg] 112.5 kg (01/26 1145) Physical Exam: General: Alert, NAD Cardiovascular: RRR, normal S1/S2 Respiratory: Normal WOB on room air lungs CTAB Abdomen: Soft,  nontender, nondistended Neuro: Alert and oriented x3, grossly intact   Laboratory: Recent Labs  Lab 10/31/20 2300 11/01/20 0300 11/02/20 0219  WBC 15.4* 14.4* 12.0*  HGB 10.5* 9.7* 10.2*  HCT 31.1* 28.5* 30.1*  PLT 351 330 350   Recent Labs  Lab 10/31/20 2300 11/01/20 0300  11/01/20 1653 11/01/20 2217 11/02/20 0219  NA 135 134* 133*  --  133*  K 3.0* 3.5 6.0* 3.7 3.4*  CL 89* 89* 96*  --  90*  CO2 31 30 27   --  32  BUN 14 13 15   --  15  CREATININE 1.10 1.11 1.17  --  1.41*  CALCIUM 7.6* 7.6* 6.6*  --  8.2*  PROT 5.8* 5.1*  --   --  5.8*  BILITOT 0.7 1.1  --   --  0.5  ALKPHOS 168* 150*  --   --  138*  ALT 31 29  --   --  29  AST 31 27  --   --  21  GLUCOSE 153* 233* 233*  --  225*    Imaging/Diagnostic Tests: CT Angio Abd/Pel w/ and/or w/o Result Date: 11/01/2020 IMPRESSION: VASCULAR 1. No significant stenosis of celiac, superior mesenteric, or inferior mesenteric arteries. 2. Hepatic, portal, superior mesenteric veins are patent. 3. No active gastrointestinal hemorrhage identified. NON-VASCULAR 1. Thickening of the second and third segments of the duodenum wall consistent with known duodenitis. 2. Appearance of the pancreas consistent with sequelae of chronic pancreatitis. 3. Hepatic steatosis   US Abdomen Limited RUQ (LIVER/GB) Result Date: 11/01/2020 IMPRESSION: 1.  No acute abnormality. 2. Coarsened heterogeneously increased hepatic parenchymal echogenicity, consistent with provided history of cirrhosis.     Alcus Dad, MD 11/02/2020, 6:28 AM PGY-1, [place] Intern pager: [phone], text pages welcome

## 2020-11-02 NOTE — Progress Notes (Signed)
HISTORY OF PRESENT ILLNESS:  Jeffrey [name] is a 55 y.o. male chronic alcoholic with diabetes mellitus hypertension who was admitted yesterday with significantly elevated alcohol level and upper GI bleeding.  Upper endoscopy revealed severe esophagitis and ulcerative changes in the post bulbar duodenum.  Biopsies pending.  CT angiogram of the abdomen was completed.  There is NO evidence for vascular disease.  Changes of chronic pancreatitis (secondary to alcohol) noted.  Patient has not had GI bleeding.  Hemoglobin stable at 10.2.  REVIEW OF SYSTEMS:  All non-GI ROS noncontributory  Past Medical History:  Diagnosis Date  . Acute renal insufficiency 12/08/2012  . AKI (acute kidney injury) ([place])   . ALCOHOL ABUSE, HX OF 11/10/2007  . Anoxic encephalopathy ([place])   . Anxiety   . Bleeding ulcer 2014  . Blood transfusion 2014   "related to bleeding ulcer"  . Cardiac arrest ([place]) 12/07/2012   Anoxic encephalopathy  . GERD (gastroesophageal reflux disease)   . High cholesterol   . Hypertension   . OSA on CPAP 12/07/2012  . Seizure-like activity ([place])   . Type II diabetes mellitus ([place])   . Venous insufficiency 12/13/2011    Past Surgical History:  Procedure Laterality Date  . BIOPSY  11/01/2020   Procedure: BIOPSY;  Surgeon: Irene Shipper, MD;  Location: [facility];  Service: Endoscopy;;  . CARDIOVASCULAR STRESS TEST  03/16/13   Very Poor Exercise Tolerance; NON DIAGNOSTIC TEST  . CATARACT EXTRACTION W/ INTRAOCULAR LENS IMPLANT Left 03/07/2014   [place] @ [facility]  . ESOPHAGOGASTRODUODENOSCOPY Left 11/26/2012   Procedure: ESOPHAGOGASTRODUODENOSCOPY (EGD);  Surgeon: Wonda Horner, MD;  Location: [facility] ENDOSCOPY;  Service: Endoscopy;  Laterality: Left;  . ESOPHAGOGASTRODUODENOSCOPY N/A 10/10/2014   Procedure: ESOPHAGOGASTRODUODENOSCOPY (EGD);  Surgeon: Ladene Artist, MD;  Location: [facility] ENDOSCOPY;  Service: Endoscopy;  Laterality: N/A;  . ESOPHAGOGASTRODUODENOSCOPY (EGD) WITH PROPOFOL N/A  11/01/2020   Procedure: ESOPHAGOGASTRODUODENOSCOPY (EGD) WITH PROPOFOL;  Surgeon: Irene Shipper, MD;  Location: [facility] ENDOSCOPY;  Service: Endoscopy;  Laterality: N/A;  . KNEE ARTHROSCOPY Left ~ [place]  reports that he has never smoked. His smokeless tobacco use includes snuff. He reports current alcohol use of about 6.0 standard drinks of alcohol per week. He reports that he does not use drugs.  family history includes Diabetes Mellitus II in his father; Other in his mother.  Allergies  Allergen Reactions  . Lisinopril Other (See Comments)    Pt reports nose bleed.  . Drug Ingredient [Spironolactone]     Hyperkalemia       PHYSICAL EXAMINATION: Vital signs: BP (!) 153/98 (BP Location: Right Arm)   Pulse 90   Temp 98 F (36.7 C) (Oral)   Resp 13   Ht 5\' 10"  (1.778 m)   Wt 112.5 kg   SpO2 92%   BMI 35.58 kg/m   Not reexamined today  LABORATORIES: Hemoglobin 10.2  X-ray: No evidence for mesenteric vascular stenosis. Chronic pancreatitis  ASSESSMENT:  1.  Upper GI bleeding secondary to erosive esophagitis and erosive duodenitis 2.  Chronic alcoholism.  Ongoing 3.  Multiple medical problems   PLAN:  1.  Pantoprazole 40 mg p.o. twice daily for 4 weeks then 40 mg p.o. daily indefinitely 2.  Stop drinking alcohol forever 3.  Ongoing general medical care per primary service 4.  No specific GI follow-up as an outpatient required.  We will follow-up biopsies  We will sign off.  Please call  for questions or problems.  Docia Chuck. Geri [place]in[place]., M.D. [facility] Division of Gastroenterology

## 2020-11-03 ENCOUNTER — Other Ambulatory Visit (HOSPITAL_COMMUNITY): Payer: Self-pay | Admitting: Family Medicine

## 2020-11-03 LAB — CBC
HCT: 25.1 % — ABNORMAL LOW (ref 39.0–52.0)
Hemoglobin: 8.8 g/dL — ABNORMAL LOW (ref 13.0–17.0)
MCH: 31.4 pg (ref 26.0–34.0)
MCHC: 35.1 g/dL (ref 30.0–36.0)
MCV: 89.6 fL (ref 80.0–100.0)
Platelets: 352 10*3/uL (ref 150–400)
RBC: 2.8 MIL/uL — ABNORMAL LOW (ref 4.22–5.81)
RDW: 16.8 % — ABNORMAL HIGH (ref 11.5–15.5)
WBC: 8.9 10*3/uL (ref 4.0–10.5)
nRBC: 0 % (ref 0.0–0.2)

## 2020-11-03 LAB — RENAL FUNCTION PANEL
Albumin: 1.5 g/dL — ABNORMAL LOW (ref 3.5–5.0)
Albumin: 1.6 g/dL — ABNORMAL LOW (ref 3.5–5.0)
Anion gap: 10 (ref 5–15)
Anion gap: 12 (ref 5–15)
BUN: 11 mg/dL (ref 6–20)
BUN: 10 mg/dL (ref 6–20)
CO2: 33 mmol/L — ABNORMAL HIGH (ref 22–32)
CO2: 33 mmol/L — ABNORMAL HIGH (ref 22–32)
Calcium: 7.6 mg/dL — ABNORMAL LOW (ref 8.9–10.3)
Calcium: 7.8 mg/dL — ABNORMAL LOW (ref 8.9–10.3)
Chloride: 91 mmol/L — ABNORMAL LOW (ref 98–111)
Chloride: 88 mmol/L — ABNORMAL LOW (ref 98–111)
Creatinine, Ser: 1.07 mg/dL (ref 0.61–1.24)
Creatinine, Ser: 1.14 mg/dL (ref 0.61–1.24)
GFR, Estimated: >60 mL/min (ref >60)
GFR, Estimated: >60 mL/min (ref >60)
Glucose, Bld: 64 mg/dL — ABNORMAL LOW (ref 70–99)
Glucose, Bld: 171 mg/dL — ABNORMAL HIGH (ref 70–99)
Phosphorus: 2 mg/dL — ABNORMAL LOW (ref 2.5–4.6)
Phosphorus: 1.8 mg/dL — ABNORMAL LOW (ref 2.5–4.6)
Potassium: 2.6 mmol/L — CL (ref 3.5–5.1)
Potassium: 4 mmol/L (ref 3.5–5.1)
Sodium: 134 mmol/L — ABNORMAL LOW (ref 135–145)
Sodium: 133 mmol/L — ABNORMAL LOW (ref 135–145)

## 2020-11-03 LAB — GLUCOSE, CAPILLARY
Glucose-Capillary: 138 mg/dL — ABNORMAL HIGH (ref 70–99)
Glucose-Capillary: 186 mg/dL — ABNORMAL HIGH (ref 70–99)
Glucose-Capillary: 186 mg/dL — ABNORMAL HIGH (ref 70–99)
Glucose-Capillary: 257 mg/dL — ABNORMAL HIGH (ref 70–99)

## 2020-11-03 LAB — SURGICAL PATHOLOGY

## 2020-11-03 LAB — MAGNESIUM
Magnesium: 1.5 mg/dL — ABNORMAL LOW (ref 1.7–2.4)
Magnesium: 1.9 mg/dL (ref 1.7–2.4)

## 2020-11-03 LAB — HEPATITIS B SURFACE ANTIBODY, QUANTITATIVE: Hep B S AB Quant (Post): <3.1 m[IU]/mL — ABNORMAL LOW (ref >9.9)

## 2020-11-03 MED ORDER — NALTREXONE HCL 50 MG PO TABS
50.0000 mg | ORAL_TABLET | Freq: Every day | ORAL | Status: DC
  Administered 2020-11-03 – 2020-11-04 (×2): 50 mg via ORAL
  Filled 2020-11-03 (×2): qty 1

## 2020-11-03 MED ORDER — POTASSIUM CHLORIDE 20 MEQ PO PACK: 40.0000 meq | PACK | Freq: Once | ORAL | Status: DC

## 2020-11-03 MED ORDER — POTASSIUM CHLORIDE 20 MEQ PO PACK
40.0000 meq | PACK | ORAL | Status: AC
  Administered 2020-11-03 (×2): 40 meq via ORAL
  Filled 2020-11-03 (×2): qty 2

## 2020-11-03 MED ORDER — LEVETIRACETAM 500 MG PO TABS: 500.0000 mg | ORAL_TABLET | Freq: Two times a day (BID) | ORAL | 0 refills | Status: DC

## 2020-11-03 MED ORDER — K PHOS MONO-SOD PHOS DI & MONO 155-852-130 MG PO TABS
250.0000 mg | ORAL_TABLET | Freq: Once | ORAL | Status: AC
  Administered 2020-11-03: 250 mg via ORAL
  Filled 2020-11-03: qty 1

## 2020-11-03 MED ORDER — PANTOPRAZOLE SODIUM 40 MG PO TBEC: DELAYED_RELEASE_TABLET | ORAL | 0 refills | Status: DC

## 2020-11-03 MED ORDER — GABAPENTIN 300 MG PO CAPS: 900.0000 mg | ORAL_CAPSULE | Freq: Three times a day (TID) | ORAL | Status: DC | PRN

## 2020-11-03 MED ORDER — MAGNESIUM SULFATE 2 GM/50ML IV SOLN
2.0000 g | Freq: Once | INTRAVENOUS | Status: AC
  Administered 2020-11-03: 2 g via INTRAVENOUS
  Filled 2020-11-03: qty 50

## 2020-11-03 MED ORDER — NALTREXONE HCL 50 MG PO TABS: 50.0000 mg | ORAL_TABLET | Freq: Every day | ORAL | 0 refills | Status: DC

## 2020-11-03 MED ORDER — LOSARTAN POTASSIUM 25 MG PO TABS
25.0000 mg | ORAL_TABLET | Freq: Every day | ORAL | Status: DC
  Administered 2020-11-03 – 2020-11-04 (×2): 25 mg via ORAL
  Filled 2020-11-03 (×2): qty 1

## 2020-11-03 MED ORDER — GABAPENTIN 300 MG PO CAPS: 900.0000 mg | ORAL_CAPSULE | Freq: Three times a day (TID) | ORAL | 0 refills | Status: DC | PRN

## 2020-11-03 MED ORDER — INSULIN GLARGINE 100 UNIT/ML ~~LOC~~ SOLN
8.0000 [IU] | Freq: Every day | SUBCUTANEOUS | Status: DC
  Administered 2020-11-03 – 2020-11-04 (×2): 8 [IU] via SUBCUTANEOUS
  Filled 2020-11-03 (×2): qty 0.08

## 2020-11-03 MED FILL — GABAPENTIN 300 MG CAPSULE: 300 | 30 days supply | Qty: 90 | Fill #0

## 2020-11-03 MED FILL — levETIRAcetam 500 MG TABS: 500 | 30 days supply | Qty: 60 | Fill #0

## 2020-11-03 MED FILL — NALTREXONE 50 MG TABLET: 50 | 30 days supply | Qty: 30 | Fill #0

## 2020-11-03 MED FILL — PANTOPRAZOLE SOD DR 40 MG T: 40 | 60 days supply | Qty: 60 | Fill #0

## 2020-11-03 NOTE — Progress Notes (Addendum)
Family Medicine Teaching Service Daily Progress Note Intern Pager: [phone]  Patient name: Jeffrey [name] Medical record number: [id number] Date of birth: [birth year] Age: 55 y.o. Gender: male  Primary Care Provider: [name], MD Consultants: GI Code Status: Full  Pt Overview and Major Events to Date:  1/25: Admitted 1/26: EGD performed  Assessment and Plan:  Jeffrey [name] is a 55 y.o. male presenting with ~5 days of hematemesis and hematochezia. PMH is significant for cirrhosis, alcohol use disorder, T2DM, anoxic brain injury s/p cardiac arrest, HTN, HLD, OSA, CKD, seizures.  Multiple Electrolyte Derangements Hypokalemia, Hypomagnesemia, Hypophosphatemia K 2.6, Mag 1.5, Phos 2.0 overnight. He was repleted with 2g Mag, 250mg  KPhos, and 43mEq Klor-Con -Repeat renal function panel/mag this morning -Continue to replete as needed  Erosive Esophagitis  Duodenal Ulceration Seen on EGD on 1/26. CTA showed no vascular abnormalities and no active GI bleeding, but demonstrated duodenitis, chronic pancreatitis, and hepatic steatosis. -GI has signed off, appreciate their assistance -Continue Protonix 40mg  PO BID (x4 weeks, then 40mg  daily indefinitely) -Will advance diet as tolerated today  Cirrhosis  Alcohol Use disorder RUQ [name] on 1/26 consistent with cirrhosis. No varices or portal hypertensive gastropathy seen on EGD. CIWA score 0 overnight. Patient is very motivated to stop drinking alcohol. -Ongoing alcohol cessation counseling -Will start Naltrexone today -CIWA monitoring -Daily thiamine, folic acid  Normocytic Anemia Hgb 8.8 this morning. Had one large BM yesterday which was dark but otherwise no bloody BM, no vomiting. -Daily CBC -Transfusion threshold <8.0 due to hx of cardiac arrest  T2DM Glucose 64 overnight, improved to 138 this morning. BG range 64-257 over past 24h. He received 10u lantus and 7u SAI yesterday. A1c 6.9%. Home meds: Farxiga, sliding scale,  Lantus 20u, Metformin -Lantus 8u daily -SSI -CBG monitoring -Will restart home gabapentin  Seizures Patient has a history of seizures, unclear whether these are related to alcohol use or true seizure disorder. Has previously been treated with Depakote and Keppra. Was supposed to taper off Depakote and start Lamictal but never did so. -CIWA monitoring -Continue Keppra 500mg  BID  Hypertension BP has been very well controlled (100s/70s) since re-starting amlodipine and metoprolol yesterday. He takes several BP meds at home including: Amlodipine 10 mg daily, chlorthalidone 25 mg daily, losartan 25 mg daily, and metoprolol 50mg  daily. I suspect there is a large component of non-compliance, so BP meds were re-started gradually -Losartan 25mg  daily -Metoprolol 50mg  daily -Holding chlorthalidone and amlodipine -Monitor BP  OSA Chronic, stable. On CPAP at home -CPAP/BiPAP nightly while admitted  CKD Creatinine 1.07 this morning, which is somewhat improved from his most recent baseline. -Continue to monitor BMP   FEN/GI: Full liquid diet (advancing as tolerated) PPx: Lovenox   Status is: Inpatient Remains inpatient appropriate because: Inpatient level of care appropriate due to severity of illness  Dispo: The patient is from: Home              Anticipated d/c is to: Home              Anticipated d/c date is: 1 day              Patient currently is not medically stable to d/c.   Difficult to place patient No   Subjective:  Patient reports significant discomfort in his GI tract after swallowing very large pills overnight. Patient is very concerned about having to take large pills. Otherwise has no complaints.   Objective: Temp:  [98 F (36.7 C)-98.9 F (37.2  C)] 98.9 F (37.2 C) (01/28 0332) Pulse Rate:  [83-108] 84 (01/28 0332) Resp:  [13-23] 15 (01/28 0332) BP: (105-158)/(72-103) 105/75 (01/28 0332) SpO2:  [91 %-99 %] 97 % (01/28 0332) Physical Exam: General: Alert,  NAD Cardiovascular: RRR, normal S1/S2 Respiratory: Normal WOB on room air lungs CTAB Abdomen: Soft, nontender, nondistended Neuro: Alert and oriented x3, grossly intact   Laboratory: Recent Labs  Lab 11/01/20 0300 11/02/20 0219 11/03/20 0152  WBC 14.4* 12.0* 8.9  HGB 9.7* 10.2* 8.8*  HCT 28.5* 30.1* 25.1*  PLT 330 350 352   Recent Labs  Lab 10/31/20 2300 11/01/20 0300 11/01/20 1653 11/01/20 2217 11/02/20 0219 11/03/20 0152  NA 135 134* 133*  --  133* 134*  K 3.0* 3.5 6.0* 3.7 3.4* 2.6*  CL 89* 89* 96*  --  90* 91*  CO2 31 30 27   --  32 33*  BUN 14 13 15   --  15 11  CREATININE 1.10 1.11 1.17  --  1.41* 1.07  CALCIUM 7.6* 7.6* 6.6*  --  8.2* 7.6*  PROT 5.8* 5.1*  --   --  5.8*  --   BILITOT 0.7 1.1  --   --  0.5  --   ALKPHOS 168* 150*  --   --  138*  --   ALT 31 29  --   --  29  --   AST 31 27  --   --  21  --   GLUCOSE 153* 233* 233*  --  225* 64*    Imaging/Diagnostic Tests: No new imaging in past 24h.   Alcus Dad, MD 11/03/2020, 6:35 AM PGY-1, [place] Intern pager: [phone], text pages welcome

## 2020-11-04 LAB — RENAL FUNCTION PANEL
Albumin: 1.4 g/dL — ABNORMAL LOW (ref 3.5–5.0)
Albumin: 1.9 g/dL — ABNORMAL LOW (ref 3.5–5.0)
Anion gap: 8 (ref 5–15)
Anion gap: 16 — ABNORMAL HIGH (ref 5–15)
BUN: 7 mg/dL (ref 6–20)
BUN: 9 mg/dL (ref 6–20)
CO2: 31 mmol/L (ref 22–32)
CO2: 29 mmol/L (ref 22–32)
Calcium: 7.7 mg/dL — ABNORMAL LOW (ref 8.9–10.3)
Calcium: 8.3 mg/dL — ABNORMAL LOW (ref 8.9–10.3)
Chloride: 94 mmol/L — ABNORMAL LOW (ref 98–111)
Chloride: 91 mmol/L — ABNORMAL LOW (ref 98–111)
Creatinine, Ser: 0.96 mg/dL (ref 0.61–1.24)
Creatinine, Ser: 1.16 mg/dL (ref 0.61–1.24)
GFR, Estimated: >60 mL/min (ref >60)
GFR, Estimated: >60 mL/min (ref >60)
Glucose, Bld: 108 mg/dL — ABNORMAL HIGH (ref 70–99)
Glucose, Bld: 177 mg/dL — ABNORMAL HIGH (ref 70–99)
Phosphorus: 1.7 mg/dL — ABNORMAL LOW (ref 2.5–4.6)
Phosphorus: 2.1 mg/dL — ABNORMAL LOW (ref 2.5–4.6)
Potassium: 3.1 mmol/L — ABNORMAL LOW (ref 3.5–5.1)
Potassium: 3.3 mmol/L — ABNORMAL LOW (ref 3.5–5.1)
Sodium: 133 mmol/L — ABNORMAL LOW (ref 135–145)
Sodium: 136 mmol/L (ref 135–145)

## 2020-11-04 LAB — CBC
HCT: 26.1 % — ABNORMAL LOW (ref 39.0–52.0)
Hemoglobin: 8.9 g/dL — ABNORMAL LOW (ref 13.0–17.0)
MCH: 31 pg (ref 26.0–34.0)
MCHC: 34.1 g/dL (ref 30.0–36.0)
MCV: 90.9 fL (ref 80.0–100.0)
Platelets: 381 10*3/uL (ref 150–400)
RBC: 2.87 MIL/uL — ABNORMAL LOW (ref 4.22–5.81)
RDW: 17.1 % — ABNORMAL HIGH (ref 11.5–15.5)
WBC: 8 10*3/uL (ref 4.0–10.5)
nRBC: 0 % (ref 0.0–0.2)

## 2020-11-04 LAB — GLUCOSE, CAPILLARY
Glucose-Capillary: 112 mg/dL — ABNORMAL HIGH (ref 70–99)
Glucose-Capillary: 148 mg/dL — ABNORMAL HIGH (ref 70–99)

## 2020-11-04 LAB — MAGNESIUM
Magnesium: 1.6 mg/dL — ABNORMAL LOW (ref 1.7–2.4)
Magnesium: 2 mg/dL (ref 1.7–2.4)

## 2020-11-04 MED ORDER — K PHOS MONO-SOD PHOS DI & MONO 155-852-130 MG PO TABS
250.0000 mg | ORAL_TABLET | Freq: Once | ORAL | Status: AC
  Administered 2020-11-04: 250 mg via ORAL
  Filled 2020-11-04: qty 1

## 2020-11-04 MED ORDER — MAGNESIUM SULFATE 2 GM/50ML IV SOLN
2.0000 g | Freq: Once | INTRAVENOUS | Status: AC
  Administered 2020-11-04: 2 g via INTRAVENOUS
  Filled 2020-11-04: qty 50

## 2020-11-04 MED ORDER — POTASSIUM CHLORIDE 20 MEQ PO PACK
40.0000 meq | PACK | Freq: Once | ORAL | Status: AC
  Administered 2020-11-04: 40 meq via ORAL
  Filled 2020-11-04: qty 2

## 2020-11-04 MED ORDER — POTASSIUM CHLORIDE 20 MEQ PO PACK
40.0000 meq | PACK | Freq: Once | ORAL | Status: AC
  Administered 2020-11-04: 40 meq via ORAL
  Filled 2020-11-04 (×2): qty 2

## 2020-11-04 NOTE — Social Work (Signed)
CSW attempted to address SA consult however patient had already left hospital when CSW went to his room.

## 2020-11-04 NOTE — Discharge Summary (Signed)
[facility] Discharge Summary  Patient name: Jeffrey [name] Medical record number: [id number] Date of birth: 11-02-1965 Age: 55 y.o. Gender: male Date of Admission: 10/31/2020  Date of Discharge: 11/04/2020 Admitting Physician: Martyn Malay, MD  Primary Care Provider: [name], MD Consultants: GI  Indication for Hospitalization: Hematemesis, melena  Discharge Diagnoses/Problem List:  Severe erosive esophagitis Duodenal ulceration Cirrhosis Alcohol use disorder Normocytic anemia Hypokalemia Hypomagnesemia Hypophosphatemia T2DM Hypertension Seizures OSA CKD Anoxic brain injury s/p cardiac arrest  Disposition: Home  Discharge Condition: Stable  Discharge Exam:  General: Alert, NAD Cardiovascular: RRR, normal S1/S2 Respiratory: Normal WOB on room air Abdomen: Soft, nontender, nondistended Neuro: Alert and oriented x3, grossly intact  Brief Hospital Course:   Jeffrey [name] a 55 y.o.malewho presented with ~5 days ofhematemesis and hematochezia. PMH is significant forcirrhosis, alcohol use disorder, T2DM,anoxic brain injury s/p cardiac arrest, HTN,HLD, OSA, CKD,seizures.  GI Bleed Patient presented with concern for GI bleed. EGD was performed and revealed severe erosive esophagitis and duodenal ulceration. There were no varices or portal hypertensive gastropathy seen on EGD. GI followed the patient and recommended Protonix 40mg  BID x4 weeks followed by 40mg  daily indefinitely. His diet was advanced as tolerated and he had no further vomiting/hematochezia.  CTA did not show active bleed, and hemoglobin remained stable at 8.9 on day of discharge.  Alcohol Use Disorder  Cirrhosis Patient's alcohol level found to be 234 in the ED. His CIWA scores were 0 throughout admission and he did not require any Ativan. Patient expressed motivation to stop drinking, so he was started on Naltrexone 50mg  daily. As noted above, fortunately no  esophageal varices were seen on EGD.  T2DM A1c found to be 6.9%. Prior to arrival meds included lantus 20u daily, sliding scale, ozempic, metformin, and farxiga. Patient very briefly required insulin gtt on admission, but then had excellent BG control. He was discharged on Metformin and Farxiga alone (insulin and ozempic discontinued).  HTN Prior to admission, patient was prescribed amlodipine 10mg  daily, chlorthalidone 25mg  daily, losartan 25mg  daily, metoprolol 50mg  daily, and torsemide 20mg  daily, but he had poor medication compliance.  He was restarted on losartan and metoprolol alone, with excellent BP control. His amlodipine, chlorthalidone, and torsemide were discontinued on discharge.  Electrolyte Derangements Patient had hypophosphatemia, hypokalemia, and hypomagnesemia throughout admission requiring frequent replacement. He was not discharged on any potassium or phosphate supplementation because his torsemide and chlorthalidone were discontinued, but it's possible he will need K or Phos in the future if he remains persistently hypokalemic/hypophospatemic.  Issues for Follow Up:  1. Please recheck renal function panel and mag 2. Ongoing alcohol cessation counseling (patient started on Naltrexone for craving reduction) 3. Patient to continue pantoprazole 40mg  BID x4 weeks, then 40mg  daily indefinitely 4. PCP f/u for HTN and T2DM medication management  Significant Procedures: EGD on 11/01/2020  Significant Labs and Imaging:  Recent Labs  Lab 11/02/20 0219 11/03/20 0152 11/04/20 0234  WBC 12.0* 8.9 8.0  HGB 10.2* 8.8* 8.9*  HCT 30.1* 25.1* 26.1*  PLT 350 352 381   Recent Labs  Lab 10/31/20 1500 10/31/20 1515 10/31/20 2300 11/01/20 0300 11/01/20 1653 11/02/20 0219 11/03/20 0152 11/03/20 0952 11/04/20 0234 11/04/20 1143  NA 132*   < > 135 134*   < > 133* 134* 133* 133* 136  K 3.4*   < > 3.0* 3.5   < > 3.4* 2.6* 4.0 3.1* 3.3*  CL 82*   < > 89* 89*   < >  90* 91* 88* 94*  91*  CO2 24   < > 31 30   < > 32 33* 33* 31 29  GLUCOSE 465*   < > 153* 233*   < > 225* 64* 171* 108* 177*  BUN 19   < > 14 13   < > 15 11 10 7 9   CREATININE 1.53*   < > 1.10 1.11   < > 1.41* 1.07 1.14 0.96 1.16  CALCIUM 7.7*   < > 7.6* 7.6*   < > 8.2* 7.6* 7.8* 7.7* 8.3*  MG  --    < >  --   --    < > 1.8 1.5* 1.9 1.6* 2.0  PHOS  --    < >  --   --    < > 2.3* 2.0* 1.8* 1.7* 2.1*  ALKPHOS 192*  --  168* 150*  --  138*  --   --   --   --   AST 50*  --  31 27  --  21  --   --   --   --   ALT 38  --  31 29  --  29  --   --   --   --   ALBUMIN 2.0*  --  1.8* 1.5*   < > 1.7* 1.5* 1.6* 1.4* 1.9*   < > = values in this interval not displayed.   CT Angio Abd/Pel w/ and/or w/o Result Date: 11/01/2020 IMPRESSION: VASCULAR 1. No significant stenosis of celiac, superior mesenteric, or inferior mesenteric arteries. 2. Hepatic, portal, superior mesenteric veins are patent. 3. No active gastrointestinal hemorrhage identified. NON-VASCULAR 1. Thickening of the second and third segments of the duodenum wall consistent with known duodenitis. 2. Appearance of the pancreas consistent with sequelae of chronic pancreatitis. 3. Hepatic steatosis   US Abdomen Limited RUQ (LIVER/GB) Result Date: 11/01/2020 IMPRESSION: 1.  No acute abnormality. 2. Coarsened heterogeneously increased hepatic parenchymal echogenicity, consistent with provided history of cirrhosis.    FINAL MICROSCOPIC DIAGNOSIS:  A. 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 STOMACH, ANTRUM, BIOPSY:  - Gastric antral mucosa with nonspecific reactive gastropathy  - Warthin Starry stain is negative for Helicobacter pylori   Results/Tests Pending at Time of Discharge: None  Discharge Medications:  Allergies as of 11/04/2020      Reactions   Lisinopril Other (See Comments)   Pt reports nose bleed.   Drug Ingredient [spironolactone]    Hyperkalemia       Medication List    STOP taking these medications   amLODipine 10 MG tablet Commonly known as: NORVASC   chlorthalidone 25 MG tablet Commonly known as: HYGROTON   divalproex 500 MG 24 hr tablet Commonly known as: Depakote ER   Fiasp FlexTouch 100 UNIT/ML FlexTouch Pen Generic drug: insulin aspart   insulin glargine 100 UNIT/ML Solostar Pen Commonly known as: LANTUS   naproxen 500 MG tablet Commonly known as: NAPROSYN   nystatin 100000 UNIT/ML suspension Commonly known as: MYCOSTATIN   omeprazole 20 MG capsule Commonly known as: PRILOSEC   Ozempic (0.25 or 0.5 MG/DOSE) 2 MG/1.5ML Sopn Generic drug: Semaglutide(0.25 or 0.5MG /DOS)   Pen Needles 3/16" 31G X 5 MM Misc   Potassium Chloride ER 20 MEQ Tbcr   torsemide 20 MG tablet Commonly known as: DEMADEX   vitamin E 180 MG (400 UNITS) capsule     TAKE these medications  aspirin EC 81 MG tablet Take 1 tablet (81 mg total) by mouth at bedtime. What changed: when to take this   atorvastatin 80 MG tablet Commonly known as: LIPITOR TAKE 1 TABLET (80 MG TOTAL) BY MOUTH AT BEDTIME.   Dexcom G6 Sensor Misc 1 Units by Does not apply route as directed.   Dexcom G6 Transmitter Misc Use every 90 days   Farxiga 10 MG Tabs tablet Generic drug: dapagliflozin propanediol Take 1 tablet (10 mg total) by mouth daily. What changed: when to take this   folic acid 1 MG tablet Commonly known as: FOLVITE Take 1 tablet (1 mg total) by mouth daily.   gabapentin 300 MG capsule Commonly known as: NEURONTIN Take 3 capsules (900 mg total) by mouth 3 (three) times daily as needed (Neuropathy). What changed:   when to take this  reasons to take this   levETIRAcetam 500 MG tablet Commonly known as: KEPPRA Take 1 tablet (500 mg total) by mouth 2 (two) times daily.   lidocaine-prilocaine cream Commonly known as: EMLA Apply 1 application topically as needed.   losartan 25 MG tablet Commonly known as: COZAAR Take 25 mg by  mouth daily.   metFORMIN 500 MG 24 hr tablet Commonly known as: GLUCOPHAGE-XR Take 1 tablet (500 mg total) by mouth daily with breakfast.   metoprolol succinate 50 MG 24 hr tablet Commonly known as: Toprol XL Take 1 tablet (50 mg total) by mouth daily.   mirtazapine 30 MG tablet Commonly known as: REMERON Take 1 tablet (30 mg total) by mouth at bedtime.   naltrexone 50 MG tablet Commonly known as: DEPADE Take 1 tablet (50 mg total) by mouth daily.   pantoprazole 40 MG tablet Commonly known as: PROTONIX Take 1 tablet twice a day for 28 days followed by 1 tablet per day ongoing.   sildenafil 100 MG tablet Commonly known as: VIAGRA TAKE 1 TABLET BY MOUTH ONCE DAILY AS NEEDED FOR ERECTILE DYSFUNCTION What changed: See the new instructions.   thiamine 100 MG tablet Commonly known as: Vitamin B-1 Take 1 tablet (100 mg total) by mouth daily. What changed: how much to take   True Metrix Blood Glucose Test test strip Generic drug: glucose blood Please use to check blood sugar up to three times daily.       Discharge Instructions: Please refer to Patient Instructions section of EMR for full details.  Patient was counseled important signs and symptoms that should prompt return to medical care, changes in medications, dietary instructions, activity restrictions, and follow up appointments.   Follow-Up Appointments:   Future Appointments  Date Time Provider [place]  11/06/2020 10:50 AM Danna Hefty, DO [facility]  12/04/2020 10:30 AM [facility] None  12/11/2020 11:30 AM Nicholas Lose, MD [facility] None     Alcus Dad, MD 11/04/2020, 3:49 PM PGY-1, [place]

## 2020-11-04 NOTE — Discharge Instructions (Signed)
Dear Jeffrey [name],   Thank you for letting us participate in your care! In this section, you will find a brief summary of why you were admitted to the hospital, what happened during your admission, your diagnosis/diagnoses, and recommended follow up.   You were admitted because you were experiencing bloody stools and vomit.   Your testing revealed erosions in your esophagus and an ulcer in your small intestine   You were treated with a medication called protonix, which you will need to continue after you leave the hospital.   You were also seen by the GI doctors during your admission  Additionally, you were treated with a medication called Naltrexone to reduce cravings for alcohol   POST-HOSPITAL & CARE INSTRUCTIONS 1. Do not drink alcohol. Under any circumstances 2. Please see medications section of this packet for any medication changes.   DOCTOR'S APPOINTMENTS & FOLLOW UP Future Appointments  Date Time Provider [place]  11/06/2020 10:50 AM Danna Hefty, DO [facility]  12/04/2020 10:30 AM [facility] None  12/11/2020 11:30 AM Nicholas Lose, MD [facility] None    Thank you for choosing [facility]! Take care and be well!  [facility]  [place], [place] [address] [phone]

## 2020-11-04 NOTE — Progress Notes (Signed)
Family Medicine Teaching Service Daily Progress Note Intern Pager: [phone]  Patient name: Jeffrey [name] Medical record number: [id number] Date of birth: Feb 27, 1966 Age: 55 y.o. Gender: male  Primary Care Provider: [name], MD Consultants: GI Code Status: Full  Pt Overview and Major Events to Date:  1/25: Admitted 1/26: EGD performed  Assessment and Plan:  Jeffrey [name] is a 55 y.o. male presenting with ~5 days of hematemesis and hematochezia. PMH is significant for cirrhosis, alcohol use disorder, T2DM, anoxic brain injury s/p cardiac arrest, HTN, HLD, OSA, CKD, seizures.  Multiple Electrolyte Derangements Hypokalemia, Hypomagnesemia, Hypophosphatemia K 3.1, Mag 1.6, Phos 1.7. -Repleted with 2g IV Mag, 250mg  K Phos and 40 mEq Klor-Con -Recheck BMP this afternoon -Encourage PO intake  Erosive Esophagitis  Duodenal Ulceration Seen on EGD on 1/26. CTA showed no vascular abnormalities and no active GI bleeding, but demonstrated duodenitis, chronic pancreatitis, and hepatic steatosis. -GI has signed off, appreciate their assistance -Continue Protonix 40mg  PO BID (x4 weeks, then 40mg  daily indefinitely) -Will advance diet as tolerated today  Cirrhosis  Alcohol Use disorder RUQ [name] on 1/26 consistent with cirrhosis. No varices or portal hypertensive gastropathy seen on EGD. Patient is very motivated to stop drinking alcohol. -Continue Naltrexone 50mg  daily -d/c'd CIWA monitoring (scores were 0 throughout admission) -Daily thiamine, folic acid  Normocytic Anemia Hgb stable at 8.9 this morning. -Daily CBC -Transfusion threshold <8.0 due to hx of cardiac arrest  T2DM BG range 108-257 over past 24h. He received 8u lantus and 4u SAI yesterday. A1c 6.9%. Home meds: Farxiga, sliding scale, Lantus 20u, Metformin -Lantus 8u daily -SSI -CBG monitoring -Continue home gabapentin for neuropathy  Seizures Patient has a history of seizures, unclear whether these are  related to alcohol use or true seizure disorder. Has previously been treated with Depakote and Keppra. Was supposed to taper off Depakote and start Lamictal but never did so. -Continue Keppra 500mg  BID  Hypertension BP has been very well controlled (100s/70s) since re-starting losartan and metoprolol. He takes several BP meds at home including: Amlodipine 10 mg daily, chlorthalidone 25 mg daily, losartan 25 mg daily, and metoprolol 50mg  daily. I suspect there is a large component of non-compliance. -Continue Losartan 25mg  daily -Continue Metoprolol 50mg  daily -Holding chlorthalidone and amlodipine -Monitor BP  OSA Chronic, stable. On CPAP at home -CPAP/BiPAP nightly while admitted  CKD Creatinine 0.96 this morning, which is somewhat improved from his most recent baseline. -Continue to monitor BMP   FEN/GI: Full liquid diet (advancing as tolerated) PPx: Lovenox   Status is: Inpatient Remains inpatient appropriate because: Persistent severe electrolyte disturbances and Ongoing diagnostic testing needed not appropriate for outpatient work up  Dispo: The patient is from: Home              Anticipated d/c is to: Home              Anticipated d/c date is: 1 day              Patient currently is not medically stable to d/c.   Difficult to place patient No   Subjective:  No acute events overnight. Patient feels like he needs to have a large BM this morning. He still has slightly decreased appetite but no nausea, vomiting.  Feels ready to go home this afternoon.  Objective: Temp:  [98.1 F (36.7 C)-98.9 F (37.2 C)] 98.9 F (37.2 C) (01/29 0405) Pulse Rate:  [74-84] 79 (01/29 0405) Resp:  [15-20] 19 (01/29 0405) BP: (98-109)/(67-76) 109/75 (  01/29 0405) SpO2:  [93 %-98 %] 97 % (01/29 0405) Physical Exam: General: Alert, NAD Cardiovascular: RRR, normal S1/S2 Respiratory: Normal WOB on room air Abdomen: Soft, nontender, nondistended Neuro: Alert and oriented x3, grossly  intact   Laboratory: Recent Labs  Lab 11/02/20 0219 11/03/20 0152 11/04/20 0234  WBC 12.0* 8.9 8.0  HGB 10.2* 8.8* 8.9*  HCT 30.1* 25.1* 26.1*  PLT 350 352 381   Recent Labs  Lab 10/31/20 2300 11/01/20 0300 11/01/20 1653 11/02/20 0219 11/03/20 0152 11/03/20 0952 11/04/20 0234  NA 135 134*   < > 133* 134* 133* 133*  K 3.0* 3.5   < > 3.4* 2.6* 4.0 3.1*  CL 89* 89*   < > 90* 91* 88* 94*  CO2 31 30   < > 32 33* 33* 31  BUN 14 13   < > 15 11 10 7   CREATININE 1.10 1.11   < > 1.41* 1.07 1.14 0.96  CALCIUM 7.6* 7.6*   < > 8.2* 7.6* 7.8* 7.7*  PROT 5.8* 5.1*  --  5.8*  --   --   --   BILITOT 0.7 1.1  --  0.5  --   --   --   ALKPHOS 168* 150*  --  138*  --   --   --   ALT 31 29  --  29  --   --   --   AST 31 27  --  21  --   --   --   GLUCOSE 153* 233*   < > 225* 64* 171* 108*   < > = values in this interval not displayed.    Imaging/Diagnostic Tests: No new imaging in past 24h.   Alcus Dad, MD 11/04/2020, 7:01 AM PGY-1, [place] Intern pager: [phone], text pages welcome

## 2020-11-04 NOTE — Progress Notes (Signed)
Patient discharges, peripheral PIV removed, telemetry discontinued. AVS given with discharge education and instruction provided.  All questions addressed.

## 2020-11-05 LAB — HEPATITIS C VRS RNA DETECT BY PCR-QUAL: Hepatitis C Vrs RNA by PCR-Qual: NEGATIVE

## 2020-11-05 LAB — CULTURE, BLOOD (ROUTINE X 2)
Culture: NO GROWTH
Special Requests: ADEQUATE

## 2020-11-05 NOTE — Progress Notes (Deleted)
Subjective:   Patient ID: [name]    DOB: [birth year], 55 y.o. male   MRN: [id number]  Jeffrey [name] is a 55 y.o. male with a history of cardiac arrest, HTN, OSA on CPAP, cirrhosis, duodenal ulcer hemorrhage, erosive esophagitis, GI bleed, hematemesis, melena, DKA, HLD, T2DM, anoxic brain injury, CKD3, alcohol abuse, ataxia, hyperglycemia, electrolyte derangements, MDD, MGUS, proteineuria, seizures, tachycardia, tremor here for hospital follow up  Hospital follow up 2/2 Cirrhosis: Patient hospitalized from 1/25-1/29 for cirrhosis with concern for GI bleed. EGD performed and noted severe erosive esophagitis and duodenal ulceration, no varices or portal hypertensive gastropathy. GI was consulted and recommended Protonix 40mg  BID x 4 weeks then QD indefinitely. His vomiting/hematochezia  Resolved. CTA negative for active bleed. Hgb on discharge was 8.9. Today he notes ***  Denies any further vomiting or hematochezia.   Alcohol Use disorder  Cirrhosis: Patient noted to have elevated ethanol level on admission. No withdrawal symtpoms during admission with CIWA scores persistently 0. He endorsed desire for alcohol cessation while hospitalization. He was started on Naltrexone 50mg  QD.   Diabetes: Last three A1C's below. Currently on ***/Currently diet controlled. Endorses compliance. Notes CBGs range ***. Denies any hypoglycemia. Denies any polyuria, polydipsia, polyphagia. Due for ***.  Lab Results  Component Value Date   HGBA1C 6.9 (H) 11/01/2020   HGBA1C 8.8 (H) 07/23/2020   HGBA1C 7.3 (H) 06/28/2020    HTN:    today. Currently on Losartan 25mg  QD and Metoprolol 50mg  QD. Previously also on Amlodipine 10mg  QD, Chlorthalidone 25mg  QD, and Torsemide 20mg  QD.  Endorses compliance. Non-smoker/Current everyday smoker***. Denies any chest pain, SOB, vision changes, or headaches.   Electrolyte Derangements:  He was noted to have hypophosphatemia, hypokalemia, and hypomagnesemia  throughout admission requiring replacement. He was not discharged on any supplementation given that his torsemide and chlorthalidone was discontinued.   Review of Systems:  Per HPI.   Objective:   There were no vitals taken for this visit. Vitals and nursing note reviewed.  General: pleasant ***, sitting comfortably in exam chair, well nourished, well developed, in no acute distress with non-toxic appearance HEENT: normocephalic, atraumatic, moist mucous membranes, oropharynx clear without erythema or exudate, TM normal bilaterally  Neck: supple, non-tender without lymphadenopathy CV: regular rate and rhythm without murmurs, rubs, or gallops, no lower extremity edema, 2+ radial and pedal pulses bilaterally Lungs: clear to auscultation bilaterally with normal work of breathing on room air Resp: breathing comfortably on room air, speaking in full sentences Abdomen: soft, non-tender, non-distended, no masses or organomegaly palpable, normoactive bowel sounds Skin: warm, dry, no rashes or lesions Extremities: warm and well perfused, normal tone MSK: ROM grossly intact, strength intact, gait normal Neuro: Alert and oriented, speech normal  Assessment & Plan:   No problem-specific Assessment & Plan notes found for this encounter.  No orders of the defined types were placed in this encounter.  No orders of the defined types were placed in this encounter.  T2DM: Chronic. Most recent A1C 6.9%. Recent adjustment to mediations with current medications including Metformin and Farxiga. Recommend follow up with PCP **8  Cirrhosis: Chronic. No varices appreciated on most recent EGD (11/01/20 . Most recent RUQ ultrasound with evidence of cirrhosis but no HCC lesions noted.  - alcohol cessation strongly encouraged - continue Naltrexone 50mg  QD - follow up with PCP in 1 month for continued monitoring - surveillance for [facility] with RUQ every six months - Continue Metoprolol  Mina Marble,  DO PGY-3, [facility] 11/05/2020 10:23 AM

## 2020-11-06 ENCOUNTER — Ambulatory Visit: Payer: 59 | Admitting: Family Medicine

## 2020-11-06 DIAGNOSIS — E1140 Type 2 diabetes mellitus with diabetic neuropathy, unspecified: Secondary | ICD-10-CM

## 2020-11-06 DIAGNOSIS — E876 Hypokalemia: Secondary | ICD-10-CM

## 2020-11-06 DIAGNOSIS — K7030 Alcoholic cirrhosis of liver without ascites: Secondary | ICD-10-CM

## 2020-11-06 DIAGNOSIS — K920 Hematemesis: Secondary | ICD-10-CM

## 2020-11-06 DIAGNOSIS — I10 Essential (primary) hypertension: Secondary | ICD-10-CM

## 2020-11-06 LAB — CULTURE, BLOOD (ROUTINE X 2): Culture: NO GROWTH

## 2020-11-16 ENCOUNTER — Other Ambulatory Visit: Payer: Self-pay

## 2020-11-16 ENCOUNTER — Ambulatory Visit: Payer: 59 | Admitting: Pharmacist

## 2020-11-16 ENCOUNTER — Ambulatory Visit (INDEPENDENT_AMBULATORY_CARE_PROVIDER_SITE_OTHER): Payer: 59 | Admitting: Family Medicine

## 2020-11-16 VITALS — BP 117/81 | HR 111 | Ht 70.0 in | Wt 233.0 lb

## 2020-11-16 DIAGNOSIS — Z794 Long term (current) use of insulin: Secondary | ICD-10-CM

## 2020-11-16 DIAGNOSIS — R569 Unspecified convulsions: Secondary | ICD-10-CM

## 2020-11-16 DIAGNOSIS — E1140 Type 2 diabetes mellitus with diabetic neuropathy, unspecified: Secondary | ICD-10-CM | POA: Diagnosis not present

## 2020-11-16 DIAGNOSIS — R400 Somnolence: Secondary | ICD-10-CM

## 2020-11-16 LAB — GLUCOSE, POCT (MANUAL RESULT ENTRY): POC Glucose: 144 mg/dl — AB (ref 70–99)

## 2020-11-16 MED ORDER — MIRTAZAPINE 30 MG PO TABS: 30.0000 mg | ORAL_TABLET | Freq: Every day | ORAL | 1 refills | Status: DC

## 2020-11-16 MED ORDER — LEVETIRACETAM 1000 MG PO TABS: 1000.0000 mg | ORAL_TABLET | Freq: Two times a day (BID) | ORAL | 1 refills | Status: DC

## 2020-11-16 NOTE — Assessment & Plan Note (Signed)
Patient seems to be having increased frequency of seizure-like activity since leaving the hospital.  The events that he describes and his wife after being interviewed over the phone seem to be consistent with seizure.  This also fits with a history of a recent change in seizure medications and no seizures prior to this change in medications.  Discussed at length with patient admitting him to the hospital in light of numerous seizures today alone and multiple other medical complexities with his overall poor state of health.  Recommended EEG and neurology consult with increase in his seizure medications.  He declined at this time and would like to try to stay home.  His wife was called and spoke with her on the phone, who also agreed to the plan.  Discussed with Dr. Erin Hearing as well.  Increased patient's Keppra to 1000 mg twice daily from 500 mg twice daily.  He was advised to take 1000 mg tonight.  Meant to obtain CMP, but BMP ordered in error.Magnesium and CBC with differential also obtained.  No need to check Keppra level as this would take a few days to return.  Advised patient that if he is feeling unwell overnight or has significant increase in the amount of seizures that he is having he should be seen right away in the emergency room.  He and his wife both voiced understanding.  He will be seen tomorrow by Dr. Ouida Sills to ensure that he is having improvement.  If he continues to have very poor p.o. intake, feel very ill overall, and not have any improvement in his seizure-like activity, would consider admission to the hospital.

## 2020-11-16 NOTE — Assessment & Plan Note (Signed)
Likely related to multiple episodes of most likely seizures.  It is likely that patient is post ictal which causes somnolence, especially lethargy that was observed by pharmacy team today.  We will treat per above and consider hospitalization tomorrow if no improvement.

## 2020-11-16 NOTE — Progress Notes (Signed)
SUBJECTIVE:   CHIEF COMPLAINT / HPI:   Hospital F/U Initially presented today to see pharmacy team but noted to be more lethargic than usual Patient admitted to the hospital from 1/25-1/29 for hematemesis and melena Found to have severe erosive esophagitis and duodenal ulcer on EGD Hgb 8.9 at discharge Has been feeling sluggish since in the hospital Last time he had alcohol was Saturday before being in the hospital He has not been able to eat, also wasn't able to in the hospital He doesn't have an appetite, gets nauseous thinking about eating Hasn't eaten anything today and can't remember the last thing he ate Hasn't had a good meal in the [place] 3 months DM has been well controlled Has been taking protonix as directed Occasionally checks BP at home, has been 140/78-80 in December, hasn't checked since coming home from the hospital Went to hospital last month because he stomach hurt, he was sneezing, coughing, and just overall feeling bad States that today he doesn't have symptoms of the flu, but "my stomach is still messed up" In the last 3 days,  Seizures have been coming back since prior to his hospitalization Has been having seizures at home, last was today before coming in Has had 3-4 today alone Prior to hospitalization hadn't had a seizure since 2019 Hasn't had opportunity to see neurologist since d/c Before entering hospital, was on depakote, but is now on [place] that he has whole body shaking with seizures Wife tells him that afterwards he is confused   PERTINENT  PMH / PSH: History of cardiac arrest with hypoxic brain injury, OSA on CPAP, HTN, duodenal ulcer, T2DM, HLD, alcohol abuse, depression, history of seizure disorder, cirrhosis  OBJECTIVE:   BP 117/81   Pulse (!) 111   Ht 5\' 10"  (1.778 m)   Wt 233 lb (105.7 kg)   SpO2 100%   BMI 33.43 kg/m    Physical Exam:  General: 55 y.o. male in NAD HEENT: NCAT Cardio: RRR no m/r/g Lungs: CTAB, no wheezing,  no rhonchi, no crackles, no IWOB on RA Abdomen: Soft, non-tender to palpation, non-distended, positive bowel sounds Skin: warm and dry Extremities: No edema Neuro: CN II through XII grossly intact, no asterixis Psych: Alert and oriented x4  Results for orders placed or performed in visit on 11/16/20 (from the past 24 hour(s))  Glucose (CBG)     Status: Abnormal   Collection Time: 11/16/20  3:00 PM  Result Value Ref Range   POC Glucose 144 (A) 70 - 99 mg/dl     ASSESSMENT/PLAN:   Seizure ([place]) Patient seems to be having increased frequency of seizure-like activity since leaving the hospital.  The events that he describes and his wife after being interviewed over the phone seem to be consistent with seizure.  This also fits with a history of a recent change in seizure medications and no seizures prior to this change in medications.  Discussed at length with patient admitting him to the hospital in light of numerous seizures today alone and multiple other medical complexities with his overall poor state of health.  Recommended EEG and neurology consult with increase in his seizure medications.  He declined at this time and would like to try to stay home.  His wife was called and spoke with her on the phone, who also agreed to the plan.  Discussed with Dr. Erin Hearing as well.  Increased patient's Keppra to 1000 mg twice daily from 500 mg twice daily.  He  was advised to take 1000 mg tonight.  Meant to obtain CMP, but BMP ordered in error.Magnesium and CBC with differential also obtained.  No need to check Keppra level as this would take a few days to return.  Advised patient that if he is feeling unwell overnight or has significant increase in the amount of seizures that he is having he should be seen right away in the emergency room.  He and his wife both voiced understanding.  He will be seen tomorrow by Dr. Ouida Sills to ensure that he is having improvement.  If he continues to have very poor p.o.  intake, feel very ill overall, and not have any improvement in his seizure-like activity, would consider admission to the hospital.  Somnolence Likely related to multiple episodes of most likely seizures.  It is likely that patient is post ictal which causes somnolence, especially lethargy that was observed by pharmacy team today.  We will treat per above and consider hospitalization tomorrow if no improvement.  Type 2 diabetes mellitus with diabetic neuropathy, with long-term current use of insulin ([place]) He has not been checking CBGs and in setting of somnolence, CBG obtained to rule out hypoglycemia.  Was within normal limits.  He has not been eating and blood sugars have been well controlled likely due to his poor p.o. intake.  Follow-up with his PCP.     Cleophas Dunker, [place]

## 2020-11-16 NOTE — Patient Instructions (Signed)
Thank you for coming to see me today. It was a pleasure. Today we talked about:   We will get labs today.  Try to stay hydrated.  Increase your Keppra to 1000mg  twice day.  You can take two tablets of your 500mg  pills tonight.  Please follow-up tomorrow at 1:30pm.  If you have any questions or concerns, please do not hesitate to call the office at [phone].  Best,   [place] Constable, DO

## 2020-11-16 NOTE — Assessment & Plan Note (Signed)
He has not been checking CBGs and in setting of somnolence, CBG obtained to rule out hypoglycemia.  Was within normal limits.  He has not been eating and blood sugars have been well controlled likely due to his poor p.o. intake.  Follow-up with his PCP.

## 2020-11-17 ENCOUNTER — Ambulatory Visit (INDEPENDENT_AMBULATORY_CARE_PROVIDER_SITE_OTHER): Payer: 59 | Admitting: Family Medicine

## 2020-11-17 ENCOUNTER — Inpatient Hospital Stay (HOSPITAL_COMMUNITY): Admission: AD | Admit: 2020-11-17 | Payer: 59 | Source: Ambulatory Visit | Admitting: Family Medicine

## 2020-11-17 ENCOUNTER — Inpatient Hospital Stay (HOSPITAL_COMMUNITY)
Admission: EM | Admit: 2020-11-17 | Discharge: 2020-11-22 | DRG: 312 | Disposition: A | Discharge status: Home / Self Care | Payer: 59 | Attending: Family Medicine | Admitting: Family Medicine

## 2020-11-17 ENCOUNTER — Other Ambulatory Visit: Payer: Self-pay

## 2020-11-17 ENCOUNTER — Emergency Department (HOSPITAL_COMMUNITY): Payer: 59

## 2020-11-17 ENCOUNTER — Encounter (HOSPITAL_COMMUNITY): Payer: Self-pay

## 2020-11-17 VITALS — BP 80/64 | HR 99 | Ht 70.0 in | Wt 234.0 lb

## 2020-11-17 DIAGNOSIS — Z7984 Long term (current) use of oral hypoglycemic drugs: Secondary | ICD-10-CM

## 2020-11-17 DIAGNOSIS — R634 Abnormal weight loss: Secondary | ICD-10-CM | POA: Diagnosis present

## 2020-11-17 DIAGNOSIS — E11649 Type 2 diabetes mellitus with hypoglycemia without coma: Secondary | ICD-10-CM | POA: Diagnosis present

## 2020-11-17 DIAGNOSIS — E785 Hyperlipidemia, unspecified: Secondary | ICD-10-CM | POA: Diagnosis present

## 2020-11-17 DIAGNOSIS — N1831 Chronic kidney disease, stage 3a: Secondary | ICD-10-CM | POA: Diagnosis present

## 2020-11-17 DIAGNOSIS — G40909 Epilepsy, unspecified, not intractable, without status epilepticus: Secondary | ICD-10-CM

## 2020-11-17 DIAGNOSIS — N179 Acute kidney failure, unspecified: Secondary | ICD-10-CM

## 2020-11-17 DIAGNOSIS — I10 Essential (primary) hypertension: Secondary | ICD-10-CM | POA: Diagnosis present

## 2020-11-17 DIAGNOSIS — R569 Unspecified convulsions: Secondary | ICD-10-CM

## 2020-11-17 DIAGNOSIS — R8281 Pyuria: Secondary | ICD-10-CM | POA: Diagnosis present

## 2020-11-17 DIAGNOSIS — G4733 Obstructive sleep apnea (adult) (pediatric): Secondary | ICD-10-CM | POA: Diagnosis present

## 2020-11-17 DIAGNOSIS — F17290 Nicotine dependence, other tobacco product, uncomplicated: Secondary | ICD-10-CM | POA: Diagnosis present

## 2020-11-17 DIAGNOSIS — K219 Gastro-esophageal reflux disease without esophagitis: Secondary | ICD-10-CM | POA: Diagnosis present

## 2020-11-17 DIAGNOSIS — R739 Hyperglycemia, unspecified: Secondary | ICD-10-CM

## 2020-11-17 DIAGNOSIS — Z7982 Long term (current) use of aspirin: Secondary | ICD-10-CM

## 2020-11-17 DIAGNOSIS — E860 Dehydration: Secondary | ICD-10-CM | POA: Diagnosis present

## 2020-11-17 DIAGNOSIS — Z8674 Personal history of sudden cardiac arrest: Secondary | ICD-10-CM

## 2020-11-17 DIAGNOSIS — K208 Other esophagitis without bleeding: Secondary | ICD-10-CM | POA: Diagnosis present

## 2020-11-17 DIAGNOSIS — Z20822 Contact with and (suspected) exposure to covid-19: Secondary | ICD-10-CM | POA: Diagnosis present

## 2020-11-17 DIAGNOSIS — R400 Somnolence: Secondary | ICD-10-CM

## 2020-11-17 DIAGNOSIS — I951 Orthostatic hypotension: Principal | ICD-10-CM | POA: Diagnosis present

## 2020-11-17 DIAGNOSIS — E876 Hypokalemia: Secondary | ICD-10-CM | POA: Diagnosis present

## 2020-11-17 DIAGNOSIS — I959 Hypotension, unspecified: Secondary | ICD-10-CM

## 2020-11-17 DIAGNOSIS — R55 Syncope and collapse: Secondary | ICD-10-CM | POA: Diagnosis present

## 2020-11-17 DIAGNOSIS — R531 Weakness: Secondary | ICD-10-CM

## 2020-11-17 DIAGNOSIS — Z79899 Other long term (current) drug therapy: Secondary | ICD-10-CM

## 2020-11-17 DIAGNOSIS — Z833 Family history of diabetes mellitus: Secondary | ICD-10-CM

## 2020-11-17 DIAGNOSIS — E1142 Type 2 diabetes mellitus with diabetic polyneuropathy: Secondary | ICD-10-CM | POA: Diagnosis present

## 2020-11-17 DIAGNOSIS — F1020 Alcohol dependence, uncomplicated: Secondary | ICD-10-CM | POA: Diagnosis present

## 2020-11-17 DIAGNOSIS — D472 Monoclonal gammopathy: Secondary | ICD-10-CM | POA: Diagnosis present

## 2020-11-17 DIAGNOSIS — F32A Depression, unspecified: Secondary | ICD-10-CM | POA: Diagnosis present

## 2020-11-17 DIAGNOSIS — K7030 Alcoholic cirrhosis of liver without ascites: Secondary | ICD-10-CM | POA: Diagnosis present

## 2020-11-17 DIAGNOSIS — R630 Anorexia: Secondary | ICD-10-CM | POA: Diagnosis present

## 2020-11-17 LAB — CBC WITH DIFFERENTIAL/PLATELET
Basophils Absolute: 0.1 10*3/uL (ref 0.0–0.2)
Basos: 1 %
EOS (ABSOLUTE): 0.1 10*3/uL (ref 0.0–0.4)
Eos: 1 %
Hematocrit: 35.7 % — ABNORMAL LOW (ref 37.5–51.0)
Hemoglobin: 11.9 g/dL — ABNORMAL LOW (ref 13.0–17.7)
Immature Grans (Abs): 0.1 10*3/uL (ref 0.0–0.1)
Immature Granulocytes: 1 %
Lymphocytes Absolute: 2.5 10*3/uL (ref 0.7–3.1)
Lymphs: 28 %
MCH: 31.1 pg (ref 26.6–33.0)
MCHC: 33.3 g/dL (ref 31.5–35.7)
MCV: 93 fL (ref 79–97)
Monocytes Absolute: 0.8 10*3/uL (ref 0.1–0.9)
Monocytes: 9 %
Neutrophils Absolute: 5.4 10*3/uL (ref 1.4–7.0)
Neutrophils: 60 %
Platelets: 668 10*3/uL — ABNORMAL HIGH (ref 150–450)
RBC: 3.83 x10E6/uL — ABNORMAL LOW (ref 4.14–5.80)
RDW: 15.3 % (ref 11.6–15.4)
WBC: 8.9 10*3/uL (ref 3.4–10.8)

## 2020-11-17 LAB — BASIC METABOLIC PANEL
Anion gap: 16 — ABNORMAL HIGH (ref 5–15)
BUN/Creatinine Ratio: 3 — ABNORMAL LOW (ref 9–20)
BUN: 6 mg/dL (ref 6–24)
BUN: 7 mg/dL (ref 6–20)
CO2: 23 mmol/L (ref 20–29)
CO2: 23 mmol/L (ref 22–32)
Calcium: 8.7 mg/dL (ref 8.7–10.2)
Calcium: 8 mg/dL — ABNORMAL LOW (ref 8.9–10.3)
Chloride: 93 mmol/L — ABNORMAL LOW (ref 96–106)
Chloride: 93 mmol/L — ABNORMAL LOW (ref 98–111)
Creatinine, Ser: 1.8 mg/dL — ABNORMAL HIGH (ref 0.76–1.27)
Creatinine, Ser: 2.11 mg/dL — ABNORMAL HIGH (ref 0.61–1.24)
GFR calc Af Amer: 48 mL/min/{1.73_m2} — ABNORMAL LOW (ref >59)
GFR calc non Af Amer: 41 mL/min/{1.73_m2} — ABNORMAL LOW (ref >59)
GFR, Estimated: 36 mL/min — ABNORMAL LOW (ref >60)
Glucose, Bld: 276 mg/dL — ABNORMAL HIGH (ref 70–99)
Glucose: 170 mg/dL — ABNORMAL HIGH (ref 65–99)
Potassium: 3.9 mmol/L (ref 3.5–5.2)
Potassium: 3.1 mmol/L — ABNORMAL LOW (ref 3.5–5.1)
Sodium: 137 mmol/L (ref 134–144)
Sodium: 132 mmol/L — ABNORMAL LOW (ref 135–145)

## 2020-11-17 LAB — CBC
HCT: 36 % — ABNORMAL LOW (ref 39.0–52.0)
Hemoglobin: 11.6 g/dL — ABNORMAL LOW (ref 13.0–17.0)
MCH: 30.1 pg (ref 26.0–34.0)
MCHC: 32.2 g/dL (ref 30.0–36.0)
MCV: 93.3 fL (ref 80.0–100.0)
Platelets: 584 10*3/uL — ABNORMAL HIGH (ref 150–400)
RBC: 3.86 MIL/uL — ABNORMAL LOW (ref 4.22–5.81)
RDW: 17.2 % — ABNORMAL HIGH (ref 11.5–15.5)
WBC: 7.9 10*3/uL (ref 4.0–10.5)
nRBC: 0 % (ref 0.0–0.2)

## 2020-11-17 LAB — TROPONIN I (HIGH SENSITIVITY)
Troponin I (High Sensitivity): 11 ng/L (ref <18)
Troponin I (High Sensitivity): 11 ng/L (ref <18)

## 2020-11-17 LAB — LACTIC ACID, PLASMA: Lactic Acid, Venous: 1.9 mmol/L (ref 0.5–1.9)

## 2020-11-17 LAB — GLUCOSE, CAPILLARY: Glucose-Capillary: 268 mg/dL — ABNORMAL HIGH (ref 70–99)

## 2020-11-17 LAB — PROTIME-INR
INR: 1.3 — ABNORMAL HIGH (ref 0.8–1.2)
Prothrombin Time: 15.2 seconds (ref 11.4–15.2)

## 2020-11-17 LAB — BRAIN NATRIURETIC PEPTIDE: B Natriuretic Peptide: 103.2 pg/mL — ABNORMAL HIGH (ref 0.0–100.0)

## 2020-11-17 LAB — MAGNESIUM: Magnesium: 1.4 mg/dL — ABNORMAL LOW (ref 1.6–2.3)

## 2020-11-17 LAB — APTT: aPTT: 29 seconds (ref 24–36)

## 2020-11-17 LAB — SARS CORONAVIRUS 2 (TAT 6-24 HRS): SARS Coronavirus 2: NEGATIVE

## 2020-11-17 MED ORDER — SODIUM CHLORIDE 0.9 % IV BOLUS (SEPSIS)
1000.0000 mL | Freq: Once | INTRAVENOUS | Status: AC
  Administered 2020-11-17: 1000 mL via INTRAVENOUS

## 2020-11-17 MED ORDER — ATORVASTATIN CALCIUM 80 MG PO TABS
80.0000 mg | ORAL_TABLET | Freq: Every day | ORAL | Status: DC
  Administered 2020-11-17 – 2020-11-21 (×5): 80 mg via ORAL
  Filled 2020-11-17 (×5): qty 1

## 2020-11-17 MED ORDER — ASPIRIN EC 81 MG PO TBEC
81.0000 mg | DELAYED_RELEASE_TABLET | Freq: Every day | ORAL | Status: DC
  Administered 2020-11-18 – 2020-11-22 (×5): 81 mg via ORAL
  Filled 2020-11-17 (×5): qty 1

## 2020-11-17 MED ORDER — POLYETHYLENE GLYCOL 3350 17 G PO PACK: 17.0000 g | PACK | Freq: Every day | ORAL | Status: DC | PRN

## 2020-11-17 MED ORDER — NALTREXONE HCL 50 MG PO TABS
50.0000 mg | ORAL_TABLET | Freq: Every day | ORAL | Status: DC
  Administered 2020-11-18 – 2020-11-21 (×4): 50 mg via ORAL
  Filled 2020-11-17 (×4): qty 1

## 2020-11-17 MED ORDER — FOLIC ACID 1 MG PO TABS
1.0000 mg | ORAL_TABLET | Freq: Every day | ORAL | Status: DC
  Administered 2020-11-18 – 2020-11-22 (×5): 1 mg via ORAL
  Filled 2020-11-17 (×6): qty 1

## 2020-11-17 MED ORDER — ACETAMINOPHEN 650 MG RE SUPP: 650.0000 mg | Freq: Four times a day (QID) | RECTAL | Status: DC | PRN

## 2020-11-17 MED ORDER — ACETAMINOPHEN 325 MG PO TABS: 650.0000 mg | ORAL_TABLET | Freq: Four times a day (QID) | ORAL | Status: DC | PRN

## 2020-11-17 MED ORDER — PANTOPRAZOLE SODIUM 40 MG PO TBEC
40.0000 mg | DELAYED_RELEASE_TABLET | Freq: Two times a day (BID) | ORAL | Status: DC
  Administered 2020-11-17 – 2020-11-22 (×10): 40 mg via ORAL
  Filled 2020-11-17 (×10): qty 1

## 2020-11-17 MED ORDER — LEVETIRACETAM 500 MG PO TABS
1000.0000 mg | ORAL_TABLET | Freq: Two times a day (BID) | ORAL | Status: DC
  Administered 2020-11-17 – 2020-11-20 (×6): 1000 mg via ORAL
  Filled 2020-11-17 (×6): qty 2

## 2020-11-17 MED ORDER — THIAMINE HCL 100 MG PO TABS
100.0000 mg | ORAL_TABLET | Freq: Every day | ORAL | Status: DC
  Administered 2020-11-18 – 2020-11-22 (×5): 100 mg via ORAL
  Filled 2020-11-17 (×5): qty 1

## 2020-11-17 MED ORDER — INSULIN ASPART 100 UNIT/ML ~~LOC~~ SOLN
0.0000 [IU] | Freq: Three times a day (TID) | SUBCUTANEOUS | Status: DC
Start: 2020-11-18 — End: 2020-11-19
  Administered 2020-11-18 (×2): 7 [IU] via SUBCUTANEOUS
  Administered 2020-11-18 – 2020-11-19 (×2): 5 [IU] via SUBCUTANEOUS

## 2020-11-17 MED ORDER — GABAPENTIN 300 MG PO CAPS: 900.0000 mg | ORAL_CAPSULE | Freq: Three times a day (TID) | ORAL | Status: DC | PRN

## 2020-11-17 MED ORDER — ENOXAPARIN SODIUM 40 MG/0.4ML ~~LOC~~ SOLN
40.0000 mg | SUBCUTANEOUS | Status: DC
  Administered 2020-11-17 – 2020-11-21 (×5): 40 mg via SUBCUTANEOUS
  Filled 2020-11-17 (×5): qty 0.4

## 2020-11-17 MED ORDER — SODIUM CHLORIDE 0.9 % IV SOLN
1000.0000 mL | INTRAVENOUS | Status: DC
  Administered 2020-11-17 – 2020-11-21 (×5): 1000 mL via INTRAVENOUS

## 2020-11-17 MED ORDER — MIRTAZAPINE 15 MG PO TABS
30.0000 mg | ORAL_TABLET | Freq: Every day | ORAL | Status: DC
  Administered 2020-11-17 – 2020-11-21 (×5): 30 mg via ORAL
  Filled 2020-11-17 (×5): qty 2

## 2020-11-17 NOTE — ED Provider Notes (Signed)
[facility] EMERGENCY DEPARTMENT Provider Note   CSN: [id number] Arrival date & time: 11/17/20  1436     History Chief Complaint  Patient presents with  . Seizures  . Hypotension    Jeffrey [name] is a 55 y.o. male.  HPI    Patient was recently admitted to the hospital from January 25 to January 29 for hematemesis and melena.  He was found to have this erosive esophagitis and a duodenal ulcer.  Patient admitted to feeling sluggish since leaving the hospital.  He stopped drinking alcohol but has not had much of an appetite since then.  Patient mentioned at the doctor's visit yesterday that he was having recurrent episodes of seizures.  They recommended admission to the hospital yesterday for further evaluation of recurrent seizures.  Patient declined and asked to go home and to follow-up today.  Patient went back to the office today and he was noted to be hypotensive with a blood pressure of 80/64.  They again recommended the patient be admitted to the hospital.  He agreed to come to the ED to have that arranged at this point.  Pt states he has had 3 seizures today.  He feels them coming on.  He gets weak, he will fall to the ground.  Pt remembers the entire event.  Pt feels like he is getting shakey.  No fevers or chills.  No blood in stool since leaving hospital.  No vomiting.  Appetite has not been good.  Pt states he has not been eating for 3 months but now that he is in the ED he feels like his appetite is good and wants something to eat.  Pt has taken his blood pressure medications last night, none this am. Past Medical History:  Diagnosis Date  . Acute renal insufficiency 12/08/2012  . AKI (acute kidney injury) ([place])   . ALCOHOL ABUSE, HX OF 11/10/2007  . Anoxic encephalopathy ([place])   . Anxiety   . Bleeding ulcer 2014  . Blood transfusion 2014   "related to bleeding ulcer"  . Cardiac arrest ([place]) 12/07/2012   Anoxic encephalopathy  . GERD (gastroesophageal  reflux disease)   . High cholesterol   . Hypertension   . OSA on CPAP 12/07/2012  . Seizure-like activity ([place])   . Type II diabetes mellitus ([place])   . Venous insufficiency 12/13/2011    Patient Active Problem List   Diagnosis Date Noted  . Hypomagnesemia 11/05/2020  . Hematemesis with nausea   . Alcohol abuse with intoxication ([place])   . Duodenal ulcer hemorrhagic   . GI bleed 10/31/2020  . Hyperglycemia 10/31/2020  . DKA (diabetic ketoacidosis) ([place]) 07/22/2020  . Candidiasis 07/22/2020  . Hyponatremia 06/28/2020  . Eyebrow laceration, left, initial encounter   . Hypokalemia   . Hyperlipidemia associated with type 2 diabetes mellitus ([place])   . MGUS (monoclonal gammopathy of unknown significance) 05/31/2020  . Seizures ([place]) 03/16/2020  . Type 2 diabetes mellitus with hyperglycemia, with long-term current use of insulin ([place]) 03/16/2020  . Tremor 03/16/2020  . Stage 3a chronic kidney disease ([place]) 01/29/2020  . Hyperphosphatemia 01/29/2020  . Major depressive disorder, recurrent episode ([place]) 01/13/2018  . Weakness   . Seizure ([place]) 04/08/2017  . Proteinuria 11/23/2015  . Essential hypertension   . Cirrhosis ([place]) 03/22/2015  . Ataxia 03/20/2015  . Dysarthria 03/09/2015  . Acute blood loss anemia 11/17/2014  . ETOH abuse   . Hyperkalemia   . Tachycardia   . Melena 10/10/2014  .  Erosive esophagitis 10/10/2014  . Somnolence 10/08/2014  . Acute kidney injury ([place])   . OSA on CPAP 12/28/2012  . Cardiac arrest ([place]) 12/07/2012  . Anoxic brain injury ([place]) 12/07/2012  . ERECTILE DYSFUNCTION 04/14/2007  . Type 2 diabetes mellitus with diabetic neuropathy, with long-term current use of insulin ([place]) 12/04/2006  . HYPERCHOLESTEROLEMIA 12/04/2006    Past Surgical History:  Procedure Laterality Date  . BIOPSY  11/01/2020   Procedure: BIOPSY;  Surgeon: Irene Shipper, MD;  Location: [facility];  Service: Endoscopy;;  . CARDIOVASCULAR STRESS TEST  03/16/13   Very Poor Exercise  Tolerance; NON DIAGNOSTIC TEST  . CATARACT EXTRACTION W/ INTRAOCULAR LENS IMPLANT Left 03/07/2014   [place] @ [facility]  . ESOPHAGOGASTRODUODENOSCOPY Left 11/26/2012   Procedure: ESOPHAGOGASTRODUODENOSCOPY (EGD);  Surgeon: Wonda Horner, MD;  Location: [facility] ENDOSCOPY;  Service: Endoscopy;  Laterality: Left;  . ESOPHAGOGASTRODUODENOSCOPY N/A 10/10/2014   Procedure: ESOPHAGOGASTRODUODENOSCOPY (EGD);  Surgeon: Ladene Artist, MD;  Location: [facility] ENDOSCOPY;  Service: Endoscopy;  Laterality: N/A;  . ESOPHAGOGASTRODUODENOSCOPY (EGD) WITH PROPOFOL N/A 11/01/2020   Procedure: ESOPHAGOGASTRODUODENOSCOPY (EGD) WITH PROPOFOL;  Surgeon: Irene Shipper, MD;  Location: [facility] ENDOSCOPY;  Service: Endoscopy;  Laterality: N/A;  . KNEE ARTHROSCOPY Left ~ 1999       Family History  Problem Relation Age of Onset  . Other Mother        Unsure of medical history.  . Diabetes Mellitus II Father     Social History   Tobacco Use  . Smoking status: Never Smoker  . Smokeless tobacco: Current User    Types: Snuff  Vaping Use  . Vaping Use: Never used  Substance Use Topics  . Alcohol use: Yes    Alcohol/week: 6.0 standard drinks    Types: 6 Cans of beer per week  . Drug use: No    Home Medications Prior to Admission medications   Medication Sig Start Date End Date Taking? Authorizing Provider  aspirin EC 81 MG tablet Take 1 tablet (81 mg total) by mouth at bedtime. Patient taking differently: Take 81 mg by mouth daily. 06/24/19   Zenia Resides, MD  atorvastatin (LIPITOR) 80 MG tablet TAKE 1 TABLET (80 MG TOTAL) BY MOUTH AT BEDTIME. 07/27/20   [name], MD  Continuous Blood Gluc Sensor (DEXCOM G6 SENSOR) MISC 1 Units by Does not apply route as directed. 09/26/20   [name], MD  Continuous Blood Gluc Transmit (DEXCOM G6 TRANSMITTER) MISC Use every 90 days 10/05/20   [name], MD  FARXIGA 10 MG TABS tablet Take 1 tablet (10 mg total) by mouth daily. Patient taking differently:  Take 10 mg by mouth at bedtime. 07/25/20   Shelly Coss, MD  folic acid (FOLVITE) 1 MG tablet Take 1 tablet (1 mg total) by mouth daily. 07/04/20   [name], MD  gabapentin (NEURONTIN) 300 MG capsule Take 3 capsules (900 mg total) by mouth 3 (three) times daily as needed (Neuropathy). 11/03/20   Lurline Del, DO  glucose blood (TRUE METRIX BLOOD GLUCOSE TEST) test strip Please use to check blood sugar up to three times daily.  07/03/20   [name], MD  levETIRAcetam (KEPPRA) 1000 MG tablet Take 1 tablet (1,000 mg total) by mouth 2 (two) times daily. 11/16/20   Meccariello, Bernita Raisin, DO  lidocaine-prilocaine (EMLA) cream Apply 1 application topically as needed.    [provider]  losartan (COZAAR) 25 MG tablet Take 25 mg by mouth daily. 10/10/20  [provider]  metFORMIN (GLUCOPHAGE-XR) 500 MG 24 hr tablet Take 1 tablet (500 mg total) by mouth daily with breakfast. 07/25/20   Shelly Coss, MD  metoprolol succinate (TOPROL XL) 50 MG 24 hr tablet Take 1 tablet (50 mg total) by mouth daily. 08/08/20 11/06/20  [name], MD  mirtazapine (REMERON) 30 MG tablet Take 1 tablet (30 mg total) by mouth at bedtime. 11/16/20   Meccariello, Bernita Raisin, DO  naltrexone (DEPADE) 50 MG tablet Take 1 tablet (50 mg total) by mouth daily. 11/03/20   Lurline Del, DO  pantoprazole (PROTONIX) 40 MG tablet Take 1 tablet twice a day for 28 days followed by 1 tablet per day ongoing. 11/03/20   Lurline Del, DO  sildenafil (VIAGRA) 100 MG tablet TAKE 1 TABLET BY MOUTH ONCE DAILY AS NEEDED FOR ERECTILE DYSFUNCTION Patient taking differently: Take 100 mg by mouth as needed for erectile dysfunction. 05/29/20   [name], MD  thiamine (VITAMIN B-1) 100 MG tablet Take 1 tablet (100 mg total) by mouth daily. Patient taking differently: Take 250 mg by mouth daily. 05/27/16   [name], DO    Allergies    Lisinopril and Drug ingredient [spironolactone]  Review of Systems    Review of Systems  All other systems reviewed and are negative.   Physical Exam Updated Vital Signs BP 120/82   Pulse 80   Temp 97.6 F (36.4 C)   Resp 19   SpO2 99%   Physical Exam Vitals and nursing note reviewed.  Constitutional:      Appearance: He is well-developed and well-nourished. He is not ill-appearing or diaphoretic.  HENT:     Head: Normocephalic and atraumatic.     Right Ear: External ear normal.     Left Ear: External ear normal.  Eyes:     General: No scleral icterus.       Right eye: No discharge.        Left eye: No discharge.     Conjunctiva/sclera: Conjunctivae normal.  Neck:     Trachea: No tracheal deviation.  Cardiovascular:     Rate and Rhythm: Normal rate and regular rhythm.     Pulses: Intact distal pulses.  Pulmonary:     Effort: Pulmonary effort is normal. No respiratory distress.     Breath sounds: Normal breath sounds. No stridor. No wheezing or rales.  Abdominal:     General: Bowel sounds are normal. There is no distension.     Palpations: Abdomen is soft.     Tenderness: There is no abdominal tenderness. There is no guarding or rebound.  Musculoskeletal:        General: No tenderness or edema.     Cervical back: Neck supple.  Skin:    General: Skin is warm and dry.     Findings: No rash.  Neurological:     Mental Status: He is alert.     Cranial Nerves: No cranial nerve deficit (no facial droop, extraocular movements intact, no slurred speech).     Sensory: No sensory deficit.     Motor: No abnormal muscle tone or seizure activity.     Coordination: Coordination normal.     Deep Tendon Reflexes: Strength normal.  Psychiatric:        Mood and Affect: Mood and affect normal.     ED Results / Procedures / Treatments   Labs (all labs ordered are listed, but only abnormal results are displayed) Labs Reviewed  BASIC METABOLIC PANEL - Abnormal;  Notable for the following components:      Result Value   Sodium 132 (*)     Potassium 3.1 (*)    Chloride 93 (*)    Glucose, Bld 276 (*)    Creatinine, Ser 2.11 (*)    Calcium 8.0 (*)    GFR, Estimated 36 (*)    Anion gap 16 (*)    All other components within normal limits  CBC - Abnormal; Notable for the following components:   RBC 3.86 (*)    Hemoglobin 11.6 (*)    HCT 36.0 (*)    RDW 17.2 (*)    Platelets 584 (*)    All other components within normal limits  BRAIN NATRIURETIC PEPTIDE - Abnormal; Notable for the following components:   B Natriuretic Peptide 103.2 (*)    All other components within normal limits  URINE CULTURE  CULTURE, BLOOD (ROUTINE X 2)  CULTURE, BLOOD (ROUTINE X 2)  SARS CORONAVIRUS 2 (TAT 6-24 HRS)  LACTIC ACID, PLASMA  LACTIC ACID, PLASMA  PROTIME-INR  APTT  URINALYSIS, ROUTINE W REFLEX MICROSCOPIC  TROPONIN I (HIGH SENSITIVITY)  TROPONIN I (HIGH SENSITIVITY)    EKG None  Radiology DG Chest Port 1 View  Result Date: 11/17/2020 CLINICAL DATA:  Pt from IM office across the street for further evaluation of multiple seizures for the past couple weeks, reports compliance with keppra. Pt also hypotensive, SBP 70s in triage, pt a.o EXAM: PORTABLE CHEST 1 VIEW COMPARISON:  10/31/2020. FINDINGS: The heart size and mediastinal contours are within normal limits. Both lungs are clear. No pleural effusion or pneumothorax. The visualized skeletal structures are intact. IMPRESSION: No active disease. Electronically Signed   By: Lajean Manes M.D.   On: 11/17/2020 16:38    Procedures .Critical Care Performed by: Dorie Rank, MD Authorized by: Dorie Rank, MD   Critical care provider statement:    Critical care time (minutes):  45   Critical care was time spent personally by me on the following activities:  Discussions with consultants, evaluation of patient's response to treatment, examination of patient, ordering and performing treatments and interventions, ordering and review of laboratory studies, ordering and review of radiographic  studies, pulse oximetry, re-evaluation of patient's condition, obtaining history from patient or surrogate and review of old charts     Medications Ordered in ED Medications  sodium chloride 0.9 % bolus 1,000 mL (1,000 mLs Intravenous New Bag/Given 11/17/20 1621)    Followed by  sodium chloride 0.9 % bolus 1,000 mL (has no administration in time range)    Followed by  0.9 %  sodium chloride infusion (has no administration in time range)    ED Course  I have reviewed the triage vital signs and the nursing notes.  Pertinent labs & imaging results that were available during my care of the patient were reviewed by me and considered in my medical decision making (see chart for details).  Clinical Course as of 11/17/20 1711  Fri Nov 17, 2020  1603 Labs reviewed.  Hemoglobin normal at 11.6.  Metabolic panel notable for elevated creatinine of 2.11.  Sodium potassium and chloride also decreased.  Troponin normal [JK]  1604 Creatinine was 1.1 613 days ago.  Consistent with AKI.  Doubt evolving sepsis at this time as patient has not had any fevers chills or infectious symptoms.  Will check lactic acid level and screening cultures.  Proceed with fluid dose [JK]    Clinical Course User Index [JK] Dorie Rank, MD  MDM Rules/Calculators/A&P                          Patient presented to the ED for evaluation of recurrent seizure-like episodes.  Patient was also noted to be hypotensive today.  He has continued to take his blood pressure medications.  His laboratory tests are notable for an elevation in his creatinine.  This time no signs of to suggest infection or sepsis.  I do have a lactic acid level pending but he is not having any infection symptoms.  In the setting of his elevated creatinine I suspect his hypotension is related to dehydration and AKI as opposed to acute infection.  No signs of acute cardiac issues.  Patient has been having recurrent seizure-like episodes.  Patient states he has full  memory of these events but according to previous notes the patient's wife had noticed that he was confused.  Unclear if these are syncopal episodes versus seizures.  Patient was sent by his PCP to be admitted.  I will contact the family medicine service. Final Clinical Impression(s) / ED Diagnoses Final diagnoses:  AKI (acute kidney injury) ([place])  Seizure disorder ([place])  Hypotension, unspecified hypotension type      Dorie Rank, MD 11/17/20 1711

## 2020-11-17 NOTE — Progress Notes (Signed)
    SUBJECTIVE:   CHIEF COMPLAINT / HPI: follow up seizures, feeling unwell  Patient seen yesterday in clinic, was recommended to go to the hospital, however patient declined. Per yesterday's provider, "If he continues to have very poor p.o. intake, feel very ill overall, and not have any improvement in his seizure-like activity, would consider admission to the hospital."  Weakness/unsteady gait: Patient presenting to clinic today to follow-up for his weakness, unsteady gait, in the setting of recent hospitalization for hematemesis/esophagitis with poor p.o. intake.  Concerningly, patient's blood pressure today 80/64, patient breathing comfortably on room air, HR at upper limit of normal.  Labs performed yesterday revealed elevated creatinine of 1.8 and hemoglobin 11.9 (concerning that these values are elevated secondary to dehydration).  Patient was seen in clinic yesterday and declined to going to the hospital.  He was sent home with strict return precautions and asked to follow-up today.  Patient reports he feels worse today.  My concern is the patient is too unstable to go home and that going home would lead to a bad outcome for this gentleman.  Feel that inpatient evaluation and treatment in the hospital is warranted at this time.  Esophagitis  Decreased Appetite: Patient with history of alcohol use disorder (last alcohol intake 10/28/2020) and Seizure disorder, was recently hospitalized 1/25*1/29 for hematemesis and melena. EGD during hospitalization showed severe erosive esophagitis and duodenal ulcer. Hg 8.9 at discharge (today 11.9, however believe this is hemoconcentrated), patient reports he still cannot eat, has not had a real meal in about 3 months. He continues to take Protonix BID. Weight today 234  Seizures: patient reports that in the last 3 days he has been having 3-4 seizures daily. Last one was last night.  The patient reports that his "seizures" are more so episodes of extreme  weakness and dizziness that leads to falls, sounds more like orthostatic hypotension.  Prior to hospitalization hadn't had a seizure since 2019, patient was stable on Depakote but this was discontinued and patient started on Keppra. Wife tells him that afterwards he is confused. Patient was seen for this same issue yesterday and his Keppra dose was increased from 500mg  BID to 1000mg  BID.   PERTINENT  PMH / PSH: History of cardiac arrest with hypoxic brain injury, OSA on CPAP, HTN, duodenal ulcer, T2DM, HLD, alcohol abuse, depression, history of seizure disorder, cirrhosis  OBJECTIVE:   BP (!) 80/64   Pulse 99   Ht 5\' 10"  (1.778 m)   Wt 234 lb (106.1 kg)   SpO2 97%   BMI 33.58 kg/m    Physical exam: General: Weak, severely fatigued Respiratory: Bilaterally, comfortable work of breathing on room air Cardio: Borderline tachycardic at 99, regular rhythm   ASSESSMENT/PLAN:   No problem-specific Assessment & Plan notes found for this encounter.     [place]

## 2020-11-17 NOTE — ED Triage Notes (Signed)
Pt from IM office across the street for further evaluation of multiple seizures for the past couple weeks, reports compliance with keppra. Pt also hypotensive, SBP 70s in triage, pt a.o

## 2020-11-17 NOTE — ED Notes (Signed)
Dinner Tray Ordered @ 1710.

## 2020-11-17 NOTE — H&P (Addendum)
[facility] Admission History and Physical Service Pager: [phone]  Patient name: Jeffrey [name] Medical record number: [id number] Date of birth: January 01, 1966 Age: 55 y.o. Gender: male  Primary Care Provider: [name], MD Consultants: None Code Status: Full  Preferred Emergency Contact: Fontaine No (wife)  Chief Complaint: Syncope-like episodes  Assessment and Plan: Jeffrey [name] is a 55 y.o. male presenting with multiple episodes of weakness and shaking while walking. PMH is significant for cirrhosis, alcohol use disorder, T2DM, anoxic brain injury s/p cardiac arrest, HTN, HLD, OSA, CKD, seizure disorder.  Syncopal Episodes Patient presents with episodes that involve diffuse shaking and weakness while walking, often resulting in falls. He "feels funny" prior to the episodes and is sometimes able to prevent them by holding on to the counter and putting his head down. Vitals remarkable for hypotension to 74/55 on arrival, all other vitals normal.  Differential includes orthostatic pre-syncope/syncope, vasovagal episodes, seizures (less likely due to maintained awareness and no incontinence or tongue-bite). Do not suspect cardiac etiology as EKG and troponins were unremarkable.  Low suspicion for infectious etiology based on history and presentation.  Based on known AKI and difficulty with p.o. intake due to erosive esophagitis, there is a high suspicion for hypotension due to poor p.o. intake.  P.o. intake. -Admit to FPTS, med-tele, attending Dr. Thompson Grayer -OOB with assistance only -Orthostatic vitals -Cardiac monitoring -PT/OT eval and treat -IV fluid hydration as discussed below -Holding home anti-hypertensives -f/u lactic acid and UA  Seizure Disorder Patient does have prior hx of seizure disorder. Takes Keppra 500mg  BID at home. His Keppra was increased to 1000mg  BID yesterday in PCP office due to concern for possible seizure episodes. However,  history provided today is more consistent with syncope and generalized weakness. Low suspicion for seizures at this time. -Keppra 500mg  BID -Seizure precautions  Hypotension  Hx of HTN Patient's BP 80/64 this afternoon at PCP office. On arrival to the ED, BP noted to be 74/55. He was given 2L NS bolus with improvement in his BP to 951O systolic. Patient has a hx of HTN-- home meds include: Metoprolol 50mg  daily and losartan 25mg  daily. -s/p 2L NS bolus -mIVF with NS @ 125 cc/hr -Holding home meds due to hypotension -Check orthostatic vitals -Monitor BP  AKI on CKD Cr 2.11 in the ED. Baseline ~1.1-1.4. Suspicious for pre-renal etiology in the setting of poor PO intake and overall volume depletion. -s/p 2L NS bolus in the ED -mIVF with NS @ 125 cc/hr -recheck BMP tomorrow am -holding home Losartan  Recent GI Bleed 2/2 Severe Erosive Esophagitis Patient admitted from 10/31/2020-11/04/2020 for GI bleed. EGD performed at that time revealed severe erosive esophagitis and duodenal ulceration without active bleed. His Hgb remained stable and he was discharged home on Protonix 40mg  BID. Hgb 11.6 on admission (was previously 8.9 on discharge). Patient reports poor PO intake since discharge from the hospital. -Continue Protonix 40mg  BID (daily after 12/02/2020) -Monitor PO intake -Consider GI consult if PO intake inadequate to maintain hydration  Hypokalemia Potassium 3.1 on admission. Likely due to decreased PO intake. 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 He was started on Naltrexone during his prior admission for craving reduction. Last drink 1/25 prior to admission. -Continue Naltrexone 50mg  daily -Congratulated patient on his recent abstinence from alcohol -Daily thiamine, folic acid  A4ZY Glucose  276 on admission. A1c 6.9% on 11/01/20. Home meds include  Metformin 500mg  daily and Farxiga 10mg  daily. -Holding home Metformin and Farxiga -sSSI -CBG monitoring qac and qhs  Depression Chronic, stable. Home meds: Mirtazapine 30mg  daily at bedtime -Continue home Mirtazapine  OSA on CPAP -CPAP qhs  HLD  s/p cardiac arrest Chronic, stable. Home meds: ASA 81mg  daily and Atorvastatin 80mg  daily -Continue home meds  FEN/GI: Regular diet Prophylaxis: Lovenox  Disposition: med-tele  History of Present Illness:  Jeffrey [name] is a 55 y.o. male presenting with episodes of generalized shaking and weakness. PMH is significant for cirrhosis, alcohol use disorder, T2DM, anoxic brain injury s/p cardiac arrest, HTN, HLD, OSA, CKD, seizure disorder.  Patient states he "feels funny", starts shaking, and then loses all strength in his extremities. This seems to be getting worse since mid-January. He will have multiple episodes per day. Sometimes, he wakes up on the floor after he feels the shaking start.   This seems to only happen when he is up and walking around.  He notes that they don't seem to happen when he is seated.  Sometimes, he can hold on the counter as hard as he can until the odd sensation passes. Lately it's been happening so frequently that he is nervous to walk anywhere.  He specifically denies dizziness, chest pain and palpitations.  He has never had any urinary incontinence.  He has bitten his tongue in the past from seizures but not anytime recently.  He's currently taking Keppra BID and does not miss doses.  He takes 500 mg per dose. Yesterday his PCP office increased to 1000mg  BID.  He notes that he has not been eating well at home.  He has been hungry but has trouble eating due to nausea.  He feels like he can't eat because he knows that if he eats the food, he'll throw up.  Review Of Systems: Per HPI with the following additions:   Review of Systems  Respiratory: Negative for shortness of breath.   Gastrointestinal: Positive  for diarrhea (8 epidosed/day) and nausea. Negative for abdominal pain and constipation.  Genitourinary: Negative for dysuria and hematuria.  Neurological: Positive for tremors, syncope, speech difficulty (chronic) and weakness.     Patient Active Problem List   Diagnosis Date Noted  . Hypomagnesemia 11/05/2020  . Hematemesis with nausea   . Alcohol abuse with intoxication ([place])   . Duodenal ulcer hemorrhagic   . GI bleed 10/31/2020  . Hyperglycemia 10/31/2020  . DKA (diabetic ketoacidosis) ([place]) 07/22/2020  . Candidiasis 07/22/2020  . Hyponatremia 06/28/2020  . Eyebrow laceration, left, initial encounter   . Hypokalemia   . Hyperlipidemia associated with type 2 diabetes mellitus ([place])   . MGUS (monoclonal gammopathy of unknown significance) 05/31/2020  . Seizures ([place]) 03/16/2020  . Type 2 diabetes mellitus with hyperglycemia, with long-term current use of insulin ([place]) 03/16/2020  . Tremor 03/16/2020  . Stage 3a chronic kidney disease ([place]) 01/29/2020  . Hyperphosphatemia 01/29/2020  . Major depressive disorder, recurrent episode ([place]) 01/13/2018  . Weakness   . Seizure ([place]) 04/08/2017  . Proteinuria 11/23/2015  . Essential hypertension   . Cirrhosis ([place]) 03/22/2015  . Ataxia 03/20/2015  . Dysarthria 03/09/2015  . Acute blood loss anemia 11/17/2014  . ETOH abuse   . Hyperkalemia   . Tachycardia   . Melena 10/10/2014  . Erosive esophagitis 10/10/2014  . Somnolence 10/08/2014  . Acute kidney injury ([place])   . OSA on CPAP 12/28/2012  .  Cardiac arrest ([place]) 12/07/2012  . Anoxic brain injury ([place]) 12/07/2012  . ERECTILE DYSFUNCTION 04/14/2007  . Type 2 diabetes mellitus with diabetic neuropathy, with long-term current use of insulin ([place]) 12/04/2006  . HYPERCHOLESTEROLEMIA 12/04/2006    Past Medical History: Past Medical History:  Diagnosis Date  . Acute renal insufficiency 12/08/2012  . AKI (acute kidney injury) ([place])   . ALCOHOL ABUSE, HX OF 11/10/2007  . 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 Anxiety   . Bleeding ulcer 2014  . Blood transfusion 2014   "related to bleeding ulcer"  . Cardiac arrest ([place]) 12/07/2012   Anoxic encephalopathy  . GERD (gastroesophageal reflux disease)   . High cholesterol   . Hypertension   . OSA on CPAP 12/07/2012  . Seizure-like activity ([place])   . Type II diabetes mellitus ([place])   . Venous insufficiency 12/13/2011    Past Surgical History: Past Surgical History:  Procedure Laterality Date  . BIOPSY  11/01/2020   Procedure: BIOPSY;  Surgeon: Irene Shipper, MD;  Location: [facility];  Service: Endoscopy;;  . CARDIOVASCULAR STRESS TEST  03/16/13   Very Poor Exercise Tolerance; NON DIAGNOSTIC TEST  . CATARACT EXTRACTION W/ INTRAOCULAR LENS IMPLANT Left 03/07/2014   [place] @ [facility]  . ESOPHAGOGASTRODUODENOSCOPY Left 11/26/2012   Procedure: ESOPHAGOGASTRODUODENOSCOPY (EGD);  Surgeon: Wonda Horner, MD;  Location: [facility] ENDOSCOPY;  Service: Endoscopy;  Laterality: Left;  . ESOPHAGOGASTRODUODENOSCOPY N/A 10/10/2014   Procedure: ESOPHAGOGASTRODUODENOSCOPY (EGD);  Surgeon: Ladene Artist, MD;  Location: [facility] ENDOSCOPY;  Service: Endoscopy;  Laterality: N/A;  . ESOPHAGOGASTRODUODENOSCOPY (EGD) WITH PROPOFOL N/A 11/01/2020   Procedure: ESOPHAGOGASTRODUODENOSCOPY (EGD) WITH PROPOFOL;  Surgeon: Irene Shipper, MD;  Location: [facility] ENDOSCOPY;  Service: Endoscopy;  Laterality: N/A;  . KNEE ARTHROSCOPY Left ~ 1999    Social History: Social History   Tobacco Use  . Smoking status: Never Smoker  . Smokeless tobacco: Current User    Types: Snuff  Vaping Use  . Vaping Use: Never used  Substance Use Topics  . Alcohol use: Yes    Alcohol/week: 6.0 standard drinks    Types: 6 Cans of beer per week  . Drug use: No   Additional social history: No alcohol use since January 25th, 2022.  Family History: Family History  Problem Relation Age of Onset  . Other Mother        Unsure of medical history.  . Diabetes Mellitus II Father      Allergies and Medications: Allergies  Allergen Reactions  . Lisinopril Other (See Comments)    Pt reports nose bleed.  . Drug Ingredient [Spironolactone]     Hyperkalemia   No current facility-administered medications on file prior to encounter.   Current Outpatient Medications on File Prior to Encounter  Medication Sig Dispense Refill  . aspirin EC 81 MG tablet Take 1 tablet (81 mg total) by mouth at bedtime. (Patient taking differently: Take 81 mg by mouth daily.) 90 tablet 3  . atorvastatin (LIPITOR) 80 MG tablet TAKE 1 TABLET (80 MG TOTAL) BY MOUTH AT BEDTIME. 30 tablet 3  . Continuous Blood Gluc Sensor (DEXCOM G6 SENSOR) MISC 1 Units by Does not apply route as directed. 1 each 3  . Continuous Blood Gluc Transmit (DEXCOM G6 TRANSMITTER) MISC Use every 90 days 1 each 3  . FARXIGA 10 MG TABS tablet Take 1 tablet (10 mg total) by mouth daily. (Patient taking differently: Take 10 mg by mouth at bedtime.) 30 tablet 1  . folic  acid (FOLVITE) 1 MG tablet Take 1 tablet (1 mg total) by mouth daily. 90 tablet 3  . gabapentin (NEURONTIN) 300 MG capsule Take 3 capsules (900 mg total) by mouth 3 (three) times daily as needed (Neuropathy). 90 capsule 0  . glucose blood (TRUE METRIX BLOOD GLUCOSE TEST) test strip Please use to check blood sugar up to three times daily.  100 each PRN  . levETIRAcetam (KEPPRA) 1000 MG tablet Take 1 tablet (1,000 mg total) by mouth 2 (two) times daily. 60 tablet 1  . lidocaine-prilocaine (EMLA) cream Apply 1 application topically as needed.    [name] losartan (COZAAR) 25 MG tablet Take 25 mg by mouth daily.    . metFORMIN (GLUCOPHAGE-XR) 500 MG 24 hr tablet Take 1 tablet (500 mg total) by mouth daily with breakfast. 30 tablet 1  . metoprolol succinate (TOPROL XL) 50 MG 24 hr tablet Take 1 tablet (50 mg total) by mouth daily. 180 tablet 1  . mirtazapine (REMERON) 30 MG tablet Take 1 tablet (30 mg total) by mouth at bedtime. 30 tablet 1  . naltrexone (DEPADE) 50 MG tablet  Take 1 tablet (50 mg total) by mouth daily. 30 tablet 0  . pantoprazole (PROTONIX) 40 MG tablet Take 1 tablet twice a day for 28 days followed by 1 tablet per day ongoing. 60 tablet 0  . sildenafil (VIAGRA) 100 MG tablet TAKE 1 TABLET BY MOUTH ONCE DAILY AS NEEDED FOR ERECTILE DYSFUNCTION (Patient taking differently: Take 100 mg by mouth as needed for erectile dysfunction.) 10 tablet 0  . thiamine (VITAMIN B-1) 100 MG tablet Take 1 tablet (100 mg total) by mouth daily. (Patient taking differently: Take 250 mg by mouth daily.) 30 tablet 12    Objective: BP 120/82   Pulse 80   Temp 97.6 F (36.4 C)   Resp 19   SpO2 99%  Exam: General: alert, well-appearing, NAD Eyes: PERRL ENTM: mildly dry mucous membranes, no tongue-bite lesions Neck: no cervical lymphadenopathy Cardiovascular: RRR, normal S1/S2 without m/r/g Respiratory: normal WOB on room air, lungs CTAB Gastrointestinal: +BS, soft, nontender, nondistended MSK: 5/5 strength in all extremities Derm: dry scaly skin of bilateral lower extremities Neuro: alert, oriented x3, grossly intact Psych: appropriate affect, normal speech  Labs and Imaging: CBC BMET  Recent Labs  Lab 11/17/20 1445  WBC 7.9  HGB 11.6*  HCT 36.0*  PLT 584*   Recent Labs  Lab 11/17/20 1445  NA 132*  K 3.1*  CL 93*  CO2 23  BUN 7  CREATININE 2.11*  GLUCOSE 276*  CALCIUM 8.0*     EKG (my own interpretation): NSR at 90bpm, PACs, prolonged QTc  DG Chest Port 1 View Result Date: 11/17/2020 IMPRESSION: No active disease.    Alcus Dad, MD 11/17/2020, 5:16 PM PGY-1, [place] Intern pager: [phone], text pages welcome  FPTS Upper-Level Resident Addendum   I have independently interviewed and examined the patient. I have discussed the above with the original author and agree with their documentation. My edits for correction/addition/clarification are in blue. Please see also any attending notes.    Matilde Haymaker  MD PGY-1, [facility] 11/17/2020 8:35 PM  [place] [facility] Service pager: [phone] (text pages welcome through [place])

## 2020-11-17 NOTE — Assessment & Plan Note (Signed)
Patient's weakness most likely secondary to poor p.o. intake, BP low today at 80/60, patient remains somnolent but is otherwise mentating well.  Other vitals stable with heart rate 99, satting well on room air. -Feel patient would benefit from admission to hospital and further evaluation there

## 2020-11-18 ENCOUNTER — Observation Stay (HOSPITAL_COMMUNITY): Payer: 59

## 2020-11-18 DIAGNOSIS — E860 Dehydration: Secondary | ICD-10-CM | POA: Diagnosis present

## 2020-11-18 DIAGNOSIS — R634 Abnormal weight loss: Secondary | ICD-10-CM | POA: Diagnosis present

## 2020-11-18 DIAGNOSIS — E785 Hyperlipidemia, unspecified: Secondary | ICD-10-CM | POA: Diagnosis present

## 2020-11-18 DIAGNOSIS — I9589 Other hypotension: Secondary | ICD-10-CM | POA: Diagnosis not present

## 2020-11-18 DIAGNOSIS — I951 Orthostatic hypotension: Secondary | ICD-10-CM | POA: Diagnosis present

## 2020-11-18 DIAGNOSIS — N1831 Chronic kidney disease, stage 3a: Secondary | ICD-10-CM | POA: Diagnosis present

## 2020-11-18 DIAGNOSIS — E876 Hypokalemia: Secondary | ICD-10-CM | POA: Diagnosis present

## 2020-11-18 DIAGNOSIS — E1142 Type 2 diabetes mellitus with diabetic polyneuropathy: Secondary | ICD-10-CM | POA: Diagnosis present

## 2020-11-18 DIAGNOSIS — G40909 Epilepsy, unspecified, not intractable, without status epilepticus: Secondary | ICD-10-CM | POA: Diagnosis present

## 2020-11-18 DIAGNOSIS — R55 Syncope and collapse: Secondary | ICD-10-CM

## 2020-11-18 DIAGNOSIS — Z8674 Personal history of sudden cardiac arrest: Secondary | ICD-10-CM | POA: Diagnosis not present

## 2020-11-18 DIAGNOSIS — R739 Hyperglycemia, unspecified: Secondary | ICD-10-CM | POA: Diagnosis not present

## 2020-11-18 DIAGNOSIS — E11649 Type 2 diabetes mellitus with hypoglycemia without coma: Secondary | ICD-10-CM | POA: Diagnosis present

## 2020-11-18 DIAGNOSIS — Z79899 Other long term (current) drug therapy: Secondary | ICD-10-CM | POA: Diagnosis not present

## 2020-11-18 DIAGNOSIS — K7030 Alcoholic cirrhosis of liver without ascites: Secondary | ICD-10-CM | POA: Diagnosis present

## 2020-11-18 DIAGNOSIS — Z7982 Long term (current) use of aspirin: Secondary | ICD-10-CM | POA: Diagnosis not present

## 2020-11-18 DIAGNOSIS — Z20822 Contact with and (suspected) exposure to covid-19: Secondary | ICD-10-CM | POA: Diagnosis present

## 2020-11-18 DIAGNOSIS — R8281 Pyuria: Secondary | ICD-10-CM | POA: Diagnosis present

## 2020-11-18 DIAGNOSIS — G4733 Obstructive sleep apnea (adult) (pediatric): Secondary | ICD-10-CM | POA: Diagnosis present

## 2020-11-18 DIAGNOSIS — K208 Other esophagitis without bleeding: Secondary | ICD-10-CM | POA: Diagnosis present

## 2020-11-18 DIAGNOSIS — N179 Acute kidney failure, unspecified: Secondary | ICD-10-CM | POA: Diagnosis present

## 2020-11-18 DIAGNOSIS — F32A Depression, unspecified: Secondary | ICD-10-CM | POA: Diagnosis present

## 2020-11-18 DIAGNOSIS — F1020 Alcohol dependence, uncomplicated: Secondary | ICD-10-CM | POA: Diagnosis present

## 2020-11-18 DIAGNOSIS — Z7984 Long term (current) use of oral hypoglycemic drugs: Secondary | ICD-10-CM | POA: Diagnosis not present

## 2020-11-18 DIAGNOSIS — K2100 Gastro-esophageal reflux disease with esophagitis, without bleeding: Secondary | ICD-10-CM | POA: Diagnosis not present

## 2020-11-18 DIAGNOSIS — R630 Anorexia: Secondary | ICD-10-CM | POA: Diagnosis present

## 2020-11-18 DIAGNOSIS — I10 Essential (primary) hypertension: Secondary | ICD-10-CM | POA: Diagnosis present

## 2020-11-18 DIAGNOSIS — I959 Hypotension, unspecified: Secondary | ICD-10-CM | POA: Diagnosis not present

## 2020-11-18 DIAGNOSIS — D472 Monoclonal gammopathy: Secondary | ICD-10-CM | POA: Diagnosis present

## 2020-11-18 LAB — CBC
HCT: 28.4 % — ABNORMAL LOW (ref 39.0–52.0)
Hemoglobin: 9.4 g/dL — ABNORMAL LOW (ref 13.0–17.0)
MCH: 30.3 pg (ref 26.0–34.0)
MCHC: 33.1 g/dL (ref 30.0–36.0)
MCV: 91.6 fL (ref 80.0–100.0)
Platelets: 409 10*3/uL — ABNORMAL HIGH (ref 150–400)
RBC: 3.1 MIL/uL — ABNORMAL LOW (ref 4.22–5.81)
RDW: 16.9 % — ABNORMAL HIGH (ref 11.5–15.5)
WBC: 7.2 10*3/uL (ref 4.0–10.5)
nRBC: 0 % (ref 0.0–0.2)

## 2020-11-18 LAB — ECHOCARDIOGRAM COMPLETE
Area-P 1/2: 3.3 cm2
Height: 70 in
S' Lateral: 2.2 cm
Weight: 3827.2 oz

## 2020-11-18 LAB — BASIC METABOLIC PANEL
Anion gap: 9 (ref 5–15)
BUN: 7 mg/dL (ref 6–20)
CO2: 25 mmol/L (ref 22–32)
Calcium: 7.2 mg/dL — ABNORMAL LOW (ref 8.9–10.3)
Chloride: 100 mmol/L (ref 98–111)
Creatinine, Ser: 1.81 mg/dL — ABNORMAL HIGH (ref 0.61–1.24)
GFR, Estimated: 43 mL/min — ABNORMAL LOW (ref >60)
Glucose, Bld: 404 mg/dL — ABNORMAL HIGH (ref 70–99)
Potassium: 3.2 mmol/L — ABNORMAL LOW (ref 3.5–5.1)
Sodium: 134 mmol/L — ABNORMAL LOW (ref 135–145)

## 2020-11-18 LAB — GLUCOSE, CAPILLARY
Glucose-Capillary: 346 mg/dL — ABNORMAL HIGH (ref 70–99)
Glucose-Capillary: 274 mg/dL — ABNORMAL HIGH (ref 70–99)
Glucose-Capillary: 303 mg/dL — ABNORMAL HIGH (ref 70–99)
Glucose-Capillary: 272 mg/dL — ABNORMAL HIGH (ref 70–99)
Glucose-Capillary: 218 mg/dL — ABNORMAL HIGH (ref 70–99)

## 2020-11-18 MED ORDER — POTASSIUM CHLORIDE 20 MEQ PO PACK
40.0000 meq | PACK | Freq: Once | ORAL | Status: AC
  Administered 2020-11-18: 40 meq via ORAL
  Filled 2020-11-18: qty 2

## 2020-11-18 NOTE — Progress Notes (Signed)
Family Medicine Teaching Service Daily Progress Note Intern Pager: [phone]  Patient name: Jeffrey [name] Medical record number: [id number] Date of birth: [birth year] Age: 55 y.o. Gender: male  Primary Care Provider: [name], MD Consultants: None Code Status: Full  Pt Overview and Major Events to Date:  01/11- Admitted to hospital  Assessment and Plan: Jeffrey [name] is a 55 y.o. male presenting with multiple episodes of weakness and shaking while walking. PMH is significant for cirrhosis, alcohol use disorder, T2DM, anoxic brain injury s/p cardiac arrest, HTN, HLD, OSA, CKD, seizure disorder.  Syncopal Episodes No syncopal episodes overnight. Patient reports he has these episodes when his seizures happen.  Describes as a funny sensation that comes over him and he sometimes passes out, falls out and then he wakes up and doesn't know what happened until told.  This has been happening for a while now.  Last seizure he reports on 2014.  He was recently seen by PCP and Jeffrey [name] recently increased.  Cannot rule out seizure activity. No focal findings on exam. He also has symptoms of orthostatic hypotension which could also be etiology of syncopal episode. ECHO shows LVEF 70-75% G1DD.  -CT head -EEG -OOB with assistance -Neuro consult -Continue to hold antihypertensives -PT/OT following -po hydration  Seizure Disorder Unclear if syncopal episode is from seizure like activity.  Was taking Depakote and switched to Jeffrey [name].  Recent increase in dosing.   -Neuro consulted, appreciate rcs -EEG -Jeffrey [name] level -Seizure precautions  Hypotension  Hx of HTN Resolved.  SBP 115-156 s/p 2 L NS bolus.   -Continue to hold home medications  -Continue NS 125/hr -BMP in am  AKI on CKD Cr 1.81.  Improved from admission. -Continue IV fluids -Encourage po fluids -Continue to hold Jeffrey [name] -BMP in am  Recent GI Bleed 2/2 Severe Erosive Esophagitis No signs of bleeding.  Hbg  9.4. -Continue Protonix  -CBC in am  Hypokalemia -Replete as needed -BMP in am  Hx of Cirrhosis  Alcohol Use Disorder -Continue Naltrexone -Daily Thiamine, Folic acid and multivitamins   T2DM Home Metformin and Farxiga -Continue sSSI  -Continue CBG monitoring  Depression -Continue Mirtazapine  OSA on CPAP -CPAP at night  HLD  s/p cardiac arrest -Continue home meds  FEN/GI: Regular diet Prophylaxis: Lovenox    Status is: Observation  The patient remains OBS appropriate and will d/c before 2 midnights.  Dispo: The patient is from: Home              Anticipated d/c is to: Home              Anticipated d/c date is: 1 day              Patient currently is not medically stable to d/c.   Difficult to place patient No    Subjective:  No acute events overnight. Reports no dizziness or seizure activity overnight.    Objective: Temp:  [97.6 F (36.4 C)-97.9 F (36.6 C)] 97.7 F (36.5 C) (02/12 0354) Pulse Rate:  [77-99] 77 (02/12 0354) Resp:  [12-19] 17 (02/12 0354) BP: (74-132)/(55-95) 118/80 (02/12 0354) SpO2:  [93 %-100 %] 100 % (02/12 0354) Weight:  [106.1 kg-108.5 kg] 108.5 kg (02/11 1900)  Physical Exam:  General: 55 y.o. male in NAD Cardio: RRR no m/r/g Lungs: CTAB, no wheezing, no rhonchi, no crackles, no IWOB on room Abdomen: Soft, non-tender to palpation, non-distended, positive bowel sounds Skin: warm and dry Extremities: No edema Neuro:  CN II-XII, motor and  sensation intact. Gait deferred.    Laboratory: Recent Labs  Lab 11/16/20 1552 11/17/20 1445 11/18/20 0545  WBC 8.9 7.9 7.2  HGB 11.9* 11.6* 9.4*  HCT 35.7* 36.0* 28.4*  PLT 668* 584* 409*   Recent Labs  Lab 11/16/20 1552 11/17/20 1445 11/18/20 0545  NA 137 132* 134*  K 3.9 3.1* 3.2*  CL 93* 93* 100  CO2 23 23 25   BUN 6 7 7   CREATININE 1.80* 2.11* 1.81*  CALCIUM 8.7 8.0* 7.2*  GLUCOSE 170* 276* 404*      Imaging/Diagnostic Tests: CT HEAD WO  CONTRAST  Result Date: 11/18/2020 CLINICAL DATA:  Syncope, recurrent. EXAM: CT HEAD WITHOUT CONTRAST TECHNIQUE: Contiguous axial images were obtained from the base of the skull through the vertex without intravenous contrast. COMPARISON:  Prior head CT examinations 10/31/2020 and earlier. Brain MRI 08/27/2016. FINDINGS: Brain: Moderate cerebral and cerebellar atrophy. There is no acute intracranial hemorrhage. No demarcated cortical infarct. No extra-axial fluid collection. No evidence of intracranial mass. No midline shift. Vascular: No hyperdense vessel.  Atherosclerotic calcifications Skull: Normal. Negative for fracture or focal lesion. Sinuses/Orbits: Visualized orbits show no acute finding. No significant paranasal sinus disease at the imaged levels. Other: Right mastoid effusion. IMPRESSION: No evidence of acute intracranial abnormality. Moderate cerebral and cerebellar atrophy, stable as compared to the head CT of 10/31/2020. Right mastoid effusion. Electronically Signed   By: Kellie Simmering DO   On: 11/18/2020 13:59   DG Chest Port 1 View  Result Date: 11/17/2020 CLINICAL DATA:  Pt from IM office across the street for further evaluation of multiple seizures for the past couple weeks, reports compliance with Jeffrey [name]. Pt also hypotensive, SBP 70s in triage, pt a.o EXAM: PORTABLE CHEST 1 VIEW COMPARISON:  10/31/2020. FINDINGS: The heart size and mediastinal contours are within normal limits. Both lungs are clear. No pleural effusion or pneumothorax. The visualized skeletal structures are intact. IMPRESSION: No active disease. Electronically Signed   By: Lajean Manes M.D.   On: 11/17/2020 16:38   ECHOCARDIOGRAM COMPLETE  Result Date: 11/18/2020    ECHOCARDIOGRAM REPORT   Patient Name:   Jeffrey [name] [facility] Date of Exam: 11/18/2020 Medical Rec #:  [id number]        Height:       70.0 in Accession #:    [id number]       Weight:       239.2 lb Date of Birth:  09-04-1966        BSA:          2.252 m Patient Age:     77 years         BP:           115/83 mmHg Patient Gender: M                HR:           84 bpm. Exam Location:  Inpatient Procedure: 2D Echo, Color Doppler and Cardiac Doppler Indications:    R55 Syncope  History:        Patient has prior history of Echocardiogram examinations, most                 recent 07/28/2017. Risk Factors:Hypertension, Diabetes,                 Dyslipidemia and Sleep Apnea.  Sonographer:    Raquel Sarna Senior RDCS Referring Phys: [id number] Media  1. Left ventricular ejection fraction, by estimation, is 70 to 75%.  The left ventricle has hyperdynamic function. The left ventricle has no regional wall motion abnormalities. There is mild concentric left ventricular hypertrophy. Left ventricular diastolic parameters are consistent with Grade I diastolic dysfunction (impaired relaxation).  2. Right ventricular systolic function is normal. The right ventricular size is normal. There is normal pulmonary artery systolic pressure.  3. The mitral valve is grossly normal. Trivial mitral valve regurgitation.  4. The aortic valve is tricuspid. There is mild thickening of the aortic valve. Aortic valve regurgitation is not visualized.  5. Aortic dilatation noted. There is mild dilatation of the ascending aorta, measuring 39 mm. Comparison(s): No significant change from prior study. FINDINGS  Left Ventricle: Left ventricular ejection fraction, by estimation, is 70 to 75%. The left ventricle has hyperdynamic function. The left ventricle has no regional wall motion abnormalities. The left ventricular internal cavity size was normal in size. There is mild concentric left ventricular hypertrophy. Left ventricular diastolic parameters are consistent with Grade I diastolic dysfunction (impaired relaxation). Right Ventricle: The right ventricular size is normal. Right vetricular wall thickness was not well visualized. Right ventricular systolic function is normal. There is normal pulmonary artery  systolic pressure. The tricuspid regurgitant velocity is 2.29 m/s, and with an assumed right atrial pressure of 3 mmHg, the estimated right ventricular systolic pressure is 31.5 mmHg. Left Atrium: Left atrial size was normal in size. Right Atrium: Right atrial size was normal in size. Pericardium: There is no evidence of pericardial effusion. Mitral Valve: The mitral valve is grossly normal. There is mild thickening of the mitral valve leaflet(s). Trivial mitral valve regurgitation. Tricuspid Valve: The tricuspid valve is normal in structure. Tricuspid valve regurgitation is trivial. Aortic Valve: The aortic valve is tricuspid. There is mild thickening of the aortic valve. Aortic valve regurgitation is not visualized. Pulmonic Valve: The pulmonic valve was normal in structure. Pulmonic valve regurgitation is trivial. Aorta: Aortic dilatation noted. There is mild dilatation of the ascending aorta, measuring 39 mm. IAS/Shunts: No atrial level shunt detected by color flow Doppler.  LEFT VENTRICLE PLAX 2D LVIDd:         4.00 cm  Diastology LVIDs:         2.20 cm  LV e' medial:    5.22 cm/s LV PW:         1.10 cm  LV E/e' medial:  12.1 LV IVS:        1.20 cm  LV e' lateral:   8.05 cm/s LVOT diam:     2.20 cm  LV E/e' lateral: 7.8 LV SV:         89 LV SV Index:   40 LVOT Area:     3.80 cm  RIGHT VENTRICLE RV S prime:     15.10 cm/s TAPSE (M-mode): 1.8 cm LEFT ATRIUM             Index       RIGHT ATRIUM           Index LA diam:        3.70 cm 1.64 cm/m  RA Area:     14.40 cm LA Vol (A2C):   46.4 ml 20.60 ml/m RA Volume:   34.60 ml  15.36 ml/m LA Vol (A4C):   44.8 ml 19.89 ml/m LA Biplane Vol: 47.5 ml 21.09 ml/m  AORTIC VALVE LVOT Vmax:   106.00 cm/s LVOT Vmean:  73.700 cm/s LVOT VTI:    0.235 m  AORTA Ao Root diam: 3.10 cm Ao Asc diam:  3.90  cm MITRAL VALVE               TRICUSPID VALVE MV Area (PHT): 3.30 cm    TR Peak grad:   21.0 mmHg MV Decel Time: 230 msec    TR Vmax:        229.00 cm/s MV E velocity: 63.00  cm/s MV A velocity: 70.70 cm/s  SHUNTS MV E/A ratio:  0.89        Systemic VTI:  0.24 m                            Systemic Diam: 2.20 cm Gwyndolyn Kaufman MD Electronically signed by Gwyndolyn Kaufman MD Signature Date/Time: 11/18/2020/2:25:37 PM    Final     Carollee Leitz, MD 11/18/2020, 7:03 AM PGY-2, [place] Intern pager: [phone], text pages welcome

## 2020-11-18 NOTE — Progress Notes (Signed)
Echocardiogram 2D Echocardiogram has been performed.  Oneal Deputy [place] Natarajan 11/18/2020, 1:39 PM

## 2020-11-18 NOTE — Evaluation (Signed)
Physical Therapy Evaluation Patient Details Name: Jeffrey [name] MRN: [id number] DOB: [birth year] Today's Date: 11/18/2020   History of Present Illness  55 y.o. male presenting with multiple episodes of weakness and shaking while walking. High suspicion for hypotension due to poor p.o. intake. PMH is significant for cirrhosis, alcohol use disorder, T2DM, anoxic brain injury s/p cardiac arrest, HTN, HLD, OSA, CKD, seizure disorder.  Clinical Impression  Pt presents to PT with deficits in activity tolerance due to orthostatic hypotension. PT limits out of bed mobility as pt's BP consistently drops with changes in position from sitting to standing. Orthostatic vitals are recorded below in General Comments section. PT provides education on the need for increased time between position changes as well as LE exercise prior to standing to reduce the risk of orthostatic vital signs. Pt will benefit from continued acute PT services to aide in increasing activity tolerance and to reduce the risk of orthostatic hypotension.    Follow Up Recommendations No PT follow up    Equipment Recommendations  None recommended by PT    Recommendations for Other Services       Precautions / Restrictions Precautions Precautions: Fall Precaution Comments: orthostatic BP Restrictions Weight Bearing Restrictions: No      Mobility  Bed Mobility Overal bed mobility: Independent                  Transfers Overall transfer level: Independent                  Ambulation/Gait Ambulation/Gait assistance: Supervision Gait Distance (Feet): 15 Feet Assistive device: None Gait Pattern/deviations: Step-through pattern Gait velocity: reduced Gait velocity interpretation: 1.31 - 2.62 ft/sec, indicative of limited community ambulator General Gait Details: pt with slowed step-to gait, ambulation distance limited this session due to orthostatic hypotension  Stairs            Wheelchair Mobility     Modified Rankin (Stroke Patients Only)       Balance Overall balance assessment: Mild deficits observed, not formally tested                                           Pertinent Vitals/Pain Pain Assessment: No/denies pain    Home Living Family/patient expects to be discharged to:: Private residence Living Arrangements: Spouse/significant other Available Help at Discharge: Family;Available 24 hours/day Type of Home: House Home Access: Stairs to enter Entrance Stairs-Rails:  (bannister on both sides) Entrance Stairs-Number of Steps: 3 Home Layout: One level Home Equipment: Walker - 2 wheels;Cane - single point      Prior Function Level of Independence: Independent               Hand Dominance   Dominant Hand: Right    Extremity/Trunk Assessment   Upper Extremity Assessment Upper Extremity Assessment: Overall WFL for tasks assessed    Lower Extremity Assessment Lower Extremity Assessment: Overall WFL for tasks assessed    Cervical / Trunk Assessment Cervical / Trunk Assessment: Normal  Communication   Communication: No difficulties  Cognition Arousal/Alertness: Awake/alert Behavior During Therapy: WFL for tasks assessed/performed Overall Cognitive Status: Within Functional Limits for tasks assessed                                        General Comments General  comments (skin integrity, edema, etc.): Orthostatic Vitals: supine 121/85 (95), sitting 129/88 (100), standing after one minute 95/77 (85), sitting 105/72 (80), standing after seated exercise 86/55 (65), BP at end of session when seated in recliner 112/87 (96). Pt denies orthostatic symtoms during session. RN made aware    Exercises General Exercises - Lower Extremity Ankle Circles/Pumps: AROM;Both;20 reps Long Arc Quad: AROM;Both;20 reps Hip Flexion/Marching: AROM;Both;20 reps;Seated   Assessment/Plan    PT Assessment Patient needs continued PT services  PT  Problem List Cardiopulmonary status limiting activity;Decreased activity tolerance;Decreased balance;Decreased safety awareness;Decreased knowledge of precautions       PT Treatment Interventions Gait training;Stair training;Functional mobility training;Therapeutic exercise;Therapeutic activities;Balance training;Patient/family education    PT Goals (Current goals can be found in the Care Plan section)  Acute Rehab PT Goals Patient Stated Goal: to resolve shaky spells when mobilizing PT Goal Formulation: With patient Time For Goal Achievement: 12/02/20 Potential to Achieve Goals: Good    Frequency Min 3X/week   Barriers to discharge        Co-evaluation               AM-PAC PT "6 Clicks" Mobility  Outcome Measure Help needed turning from your back to your side while in a flat bed without using bedrails?: None Help needed moving from lying on your back to sitting on the side of a flat bed without using bedrails?: None Help needed moving to and from a bed to a chair (including a wheelchair)?: None Help needed standing up from a chair using your arms (e.g., wheelchair or bedside chair)?: None Help needed to walk in hospital room?: A Little Help needed climbing 3-5 steps with a railing? : A Little 6 Click Score: 22    End of Session   Activity Tolerance: Patient tolerated treatment well;Treatment limited secondary to medical complications (Comment) (limited by orthostatic vitals) Patient left: in chair;with call bell/phone within reach;with chair alarm set Nurse Communication: Mobility status PT Visit Diagnosis: Other abnormalities of gait and mobility (R26.89)    Time: 3009-2330 PT Time Calculation (min) (ACUTE ONLY): 38 min   Charges:   PT Evaluation $PT Eval Moderate Complexity: 1 Mod PT Treatments $Therapeutic Activity: 8-22 mins        Zenaida Niece, PT, DPT Acute Rehabilitation Pager: [phone]   Zenaida Niece 11/18/2020, 12:31 PM

## 2020-11-18 NOTE — Plan of Care (Signed)
  Problem: Safety: Goal: Ability to remain free from injury will improve Outcome: Progressing   Problem: Clinical Measurements: Goal: Ability to maintain clinical measurements within normal limits will improve Outcome: Progressing Goal: Will remain free from infection Outcome: Progressing Goal: Diagnostic test results will improve Outcome: Progressing Goal: Respiratory complications will improve Outcome: Progressing Goal: Cardiovascular complication will be avoided Outcome: Progressing

## 2020-11-18 NOTE — Evaluation (Signed)
Occupational Therapy Evaluation Patient Details Name: Jeffrey [name] MRN: [id number] DOB: [birth year] Today's Date: 11/18/2020    History of Present Illness 55 y.o. male presenting with multiple episodes of weakness and shaking while walking. High suspicion for hypotension due to poor p.o. intake. PMH is significant for cirrhosis, alcohol use disorder, T2DM, anoxic brain injury s/p cardiac arrest, HTN, HLD, OSA, CKD, seizure disorder.   Clinical Impression   PTA patient was living with his spouse and children and was independent with ADLs/IADLs without AD. Patient currently functioning at baseline although limited by asymptomatic orthostatic hypotension this date. Patient reports poor p.o. intake 2/2 decreased appetite x several weeks. Education provided on allowing increased time with change in position, ankle pumps, and attempting to get adequate oral intake. Patient frustrated with hospital admission and states that he believes something else is wrong and causing syncopal episodes. OT encouraged patient to speak with MD at time of next bedside visit. Patient does not require continued acute occupational therapy services with OT to sign off at this time.      Follow Up Recommendations  No OT follow up    Equipment Recommendations  None recommended by OT    Recommendations for Other Services       Precautions / Restrictions Precautions Precautions: Fall Precaution Comments: orthostatic BP Restrictions Weight Bearing Restrictions: No      Mobility Bed Mobility Overal bed mobility: Independent                  Transfers Overall transfer level: Independent                    Balance Overall balance assessment: Mild deficits observed, not formally tested                                         ADL either performed or assessed with clinical judgement   ADL Overall ADL's : At baseline                                        General ADL Comments: Limited by orthostatic hypotension this date but demonstrates LB dressing with I.     Vision   Vision Assessment?: No apparent visual deficits     Perception     Praxis      Pertinent Vitals/Pain Pain Assessment: No/denies pain     Hand Dominance Right   Extremity/Trunk Assessment Upper Extremity Assessment Upper Extremity Assessment: Overall WFL for tasks assessed   Lower Extremity Assessment Lower Extremity Assessment: Overall WFL for tasks assessed   Cervical / Trunk Assessment Cervical / Trunk Assessment: Normal   Communication Communication Communication: No difficulties   Cognition Arousal/Alertness: Awake/alert Behavior During Therapy: WFL for tasks assessed/performed Overall Cognitive Status: Within Functional Limits for tasks assessed                                     General Comments  Patient met seated in recliner. Vitals assessed with BP 111/86. In standing BP dropped to 97/66. Patient asymptomatic. Further mobility deferred at this time.    Exercises Exercises: General Lower Extremity General Exercises - Lower Extremity Ankle Circles/Pumps: AROM;Both;20 reps Long Arc Quad: AROM;Both;20 reps Hip Flexion/Marching: AROM;Both;20 reps;Seated  Shoulder Instructions      Home Living Family/patient expects to be discharged to:: Private residence Living Arrangements: Spouse/significant other Available Help at Discharge: Family;Available 24 hours/day Type of Home: House Home Access: Stairs to enter [employer] of Steps: 3 Entrance Stairs-Rails:  (bannister on both sides) Home Layout: One level     Bathroom Shower/Tub: Teacher, early years/pre: Standard     Home Equipment: Environmental consultant - 2 wheels;Cane - single point          Prior Functioning/Environment Level of Independence: Independent                 OT Problem List:        OT Treatment/Interventions:      OT Goals(Current  goals can be found in the care plan section) Acute Rehab OT Goals Patient Stated Goal: To figure out why he's having shakiness and near syncope. OT Goal Formulation: With patient  OT Frequency:     Barriers to D/C:            Co-evaluation              AM-PAC OT "6 Clicks" Daily Activity     Outcome Measure Help from another person eating meals?: None Help from another person taking care of personal grooming?: None Help from another person toileting, which includes using toliet, bedpan, or urinal?: None Help from another person bathing (including washing, rinsing, drying)?: None Help from another person to put on and taking off regular upper body clothing?: None Help from another person to put on and taking off regular lower body clothing?: None 6 Click Score: 24   End of Session Equipment Utilized During Treatment: Gait belt  Activity Tolerance: Treatment limited secondary to medical complications (Comment) (Limited 2/2 orthostatic BP) Patient left: in chair;with call bell/phone within reach;with chair alarm set  OT Visit Diagnosis: Dizziness and giddiness (R42)                Time: 2258-3462 OT Time Calculation (min): 19 min Charges:  OT General Charges $OT Visit: 1 Visit OT Evaluation $OT Eval Low Complexity: 1 Low  Dantonio Justen H. OTR/L Supplemental OT, Department of rehab services [phone]  Ellwyn Ergle R H. 11/18/2020, 1:33 PM

## 2020-11-18 NOTE — Consult Note (Signed)
NEURO HOSPITALIST CONSULT NOTE   Requestig physician: Dr. Thompson Grayer  Reason for Consult: Recurrent syncopal spells with seizure like activity.   History obtained from:   Patient and Chart    HPI:                                                                                                                                          Jeffrey [name] is an 55 y.o. male with a PMHx of seizures, alcohol abuse, AKI, anoxic encephalopathy, bleeding ulcer, cardiac arrest, hypercholesterolemia, HTN, OSA and DM2 presenting with recurrent syncopal spells with seizure-like activity. All of the spells are stereotyped. The patient tends to have a premonition prior to each spell where he feels very anxious, as though "something bad is going to happen", he then starts to have uncontrolled jerking of all 4 limbs, which he has some control over if he concentrates and holds onto something. Usually, the shaking will go away after about 2 minutes, but at other times the spell progresses to loss of consciousness and falling followed by continued limb jerking. When the spells first occurred, during about the second decade of his life, he had had tongue biting as well as loss of urinary continence; these manifestations no longer occur with the spells. He states that about 2 weeks ago he was admitted for management of hematemesis and hematochezia. He is not sure why, but his valproic acid was switched to Keppra at that time. Since then he has had increased frequency of spells to 2-3x per day. Prior to the change, he had not had a spell since 2019, but had one at the time of his presentation two weeks ago, which patient thinks is why his VPA was switched to [place].    Of note, he was hypotensive on arrival to the ED, but he thinks that the hypotension may be due to his seizure-like spells.   CT head was negative for acute bleed.   Regarding the spells, Family Medicine assessment from the H and P performed  yesterday was reviewed: "Patient presents with episodes that involve diffuse shaking and weakness while walking, often resulting in falls. He "feels funny" prior to the episodes and is sometimes able to prevent them by holding on to the counter and putting his head down. Vitals remarkable for hypotension to 74/55 on arrival, all other vitals normal. Differential includes orthostatic pre-syncope/syncope, vasovagal episodes, seizures (less likely due to maintained awareness and no incontinence or tongue-bite). Do not suspect cardiac etiology as EKG and troponins were unremarkable.  Low suspicion for infectious etiology based on history and presentation.  Based on known AKI and difficulty with p.o. intake due to erosive esophagitis, there is a high suspicion for hypotension due to poor p.o. intake.  P.o. intake."  Past Medical  History:  Diagnosis Date  . Acute renal insufficiency 12/08/2012  . AKI (acute kidney injury) ([place])   . ALCOHOL ABUSE, HX OF 11/10/2007  . Anoxic encephalopathy ([place])   . Anxiety   . Bleeding ulcer 2014  . Blood transfusion 2014   "related to bleeding ulcer"  . Cardiac arrest ([place]) 12/07/2012   Anoxic encephalopathy  . GERD (gastroesophageal reflux disease)   . High cholesterol   . Hypertension   . OSA on CPAP 12/07/2012  . Seizure-like activity ([place])   . Type II diabetes mellitus ([place])   . Venous insufficiency 12/13/2011    Past Surgical History:  Procedure Laterality Date  . BIOPSY  11/01/2020   Procedure: BIOPSY;  Surgeon: Irene Shipper, MD;  Location: [place];  Service: Endoscopy;;  . CARDIOVASCULAR STRESS TEST  03/16/13   Very Poor Exercise Tolerance; NON DIAGNOSTIC TEST  . CATARACT EXTRACTION W/ INTRAOCULAR LENS IMPLANT Left 03/07/2014   [place] @ [facility]  . ESOPHAGOGASTRODUODENOSCOPY Left 11/26/2012   Procedure: ESOPHAGOGASTRODUODENOSCOPY (EGD);  Surgeon: Wonda Horner, MD;  Location: [facility] ENDOSCOPY;  Service: Endoscopy;  Laterality: Left;  .  ESOPHAGOGASTRODUODENOSCOPY N/A 10/10/2014   Procedure: ESOPHAGOGASTRODUODENOSCOPY (EGD);  Surgeon: Ladene Artist, MD;  Location: [facility] ENDOSCOPY;  Service: Endoscopy;  Laterality: N/A;  . ESOPHAGOGASTRODUODENOSCOPY (EGD) WITH PROPOFOL N/A 11/01/2020   Procedure: ESOPHAGOGASTRODUODENOSCOPY (EGD) WITH PROPOFOL;  Surgeon: Irene Shipper, MD;  Location[facility] ENDOSCOPY;  Service: Endoscopy;  Laterality: N/A;  . KNEE ARTHROSCOPY Left ~ 1999    Family History  Problem Relation Age of Onset  . Other Mother        Unsure of medical history.  . Diabetes Mellitus II Father               Social History:  reports that he has never smoked. His smokeless tobacco use includes snuff. He reports current alcohol use of about 6.0 standard drinks of alcohol per week. He reports that he does not use drugs.  Allergies  Allergen Reactions  . Lisinopril Other (See Comments)    Pt reports nose bleed.  . Drug Ingredient [Spironolactone]     Hyperkalemia    MEDICATIONS:                                                                                                                     Prior to Admission:  Medications Prior to Admission  Medication Sig Dispense Refill Last Dose  . aspirin EC 81 MG tablet Take 1 tablet (81 mg total) by mouth at bedtime. 90 tablet 3 Past Week at Unknown time  . atorvastatin (LIPITOR) 80 MG tablet TAKE 1 TABLET (80 MG TOTAL) BY MOUTH AT BEDTIME. 30 tablet 3 Past Week at Unknown time  . FARXIGA 10 MG TABS tablet Take 1 tablet (10 mg total) by mouth daily. 30 tablet 1 11/17/2020 at Unknown time  . folic acid (FOLVITE) 1 MG tablet Take 1 tablet (1 mg total) by mouth daily. (Patient taking differently: Take 1  mg by mouth at bedtime.) 90 tablet 3 Past Week at Unknown time  . gabapentin (NEURONTIN) 300 MG capsule Take 3 capsules (900 mg total) by mouth 3 (three) times daily as needed (Neuropathy). 90 capsule 0 Past Week at Unknown time  . levETIRAcetam (KEPPRA) 1000 MG tablet Take 1 tablet (1,000  mg total) by mouth 2 (two) times daily. 60 tablet 1 11/17/2020 at Unknown time  . lidocaine-prilocaine (EMLA) cream Apply 1 application topically as needed (pain).   Past Month at Unknown time  . losartan (COZAAR) 25 MG tablet Take 25 mg by mouth daily.   11/17/2020 at Unknown time  . metFORMIN (GLUCOPHAGE-XR) 500 MG 24 hr tablet Take 1 tablet (500 mg total) by mouth daily with breakfast. 30 tablet 1 11/17/2020 at Unknown time  . metoprolol succinate (TOPROL XL) 50 MG 24 hr tablet Take 1 tablet (50 mg total) by mouth daily. 180 tablet 1 11/17/2020 at 0800  . mirtazapine (REMERON) 30 MG tablet Take 1 tablet (30 mg total) by mouth at bedtime. 30 tablet 1 Past Week at Unknown time  . naltrexone (DEPADE) 50 MG tablet Take 1 tablet (50 mg total) by mouth daily. 30 tablet 0 11/17/2020 at Unknown time  . pantoprazole (PROTONIX) 40 MG tablet Take 1 tablet twice a day for 28 days followed by 1 tablet per day ongoing. (Patient taking differently: Take 40 mg by mouth 2 (two) times daily. Take 1 tablet twice a day for 28 days followed by 1 tablet per day ongoing.) 60 tablet 0 11/17/2020 at Unknown time  . potassium chloride SA (KLOR-CON) 20 MEQ tablet Take 20 mEq by mouth daily.   11/17/2020 at Unknown time  . sildenafil (VIAGRA) 100 MG tablet TAKE 1 TABLET BY MOUTH ONCE DAILY AS NEEDED FOR ERECTILE DYSFUNCTION (Patient taking differently: Take 100 mg by mouth as needed for erectile dysfunction.) 10 tablet 0 unknown at unknown  . thiamine (VITAMIN B-1) 100 MG tablet Take 1 tablet (100 mg total) by mouth daily. 30 tablet 12 11/17/2020 at Unknown time  . Continuous Blood Gluc Sensor (DEXCOM G6 SENSOR) MISC 1 Units by Does not apply route as directed. 1 each 3   . Continuous Blood Gluc Transmit (DEXCOM G6 TRANSMITTER) MISC Use every 90 days 1 each 3   . glucose blood (TRUE METRIX BLOOD GLUCOSE TEST) test strip Please use to check blood sugar up to three times daily.  100 each PRN    Scheduled: . aspirin EC  81 mg Oral  Daily  . atorvastatin  80 mg Oral QHS  . enoxaparin (LOVENOX) injection  40 mg Subcutaneous Q24H  . folic acid  1 mg Oral Daily  . insulin aspart  0-9 Units Subcutaneous TID WC  . levETIRAcetam  1,000 mg Oral BID  . mirtazapine  30 mg Oral QHS  . naltrexone  50 mg Oral Daily  . pantoprazole  40 mg Oral BID  . thiamine  100 mg Oral Daily   Continuous: . sodium chloride 1,000 mL (11/18/20 0611)     ROS:  As per HPI   Blood pressure 115/83, pulse 93, temperature (!) 97.5 F (36.4 C), temperature source Oral, resp. rate 17, height 5\' 10"  (1.778 m), weight 108.5 kg, SpO2 (!) 88 %.   General Examination:                                                                                                       Physical Exam  HEENT-  [place]/AT   Lungs- Respirations unlaboreder Extremities- Warm and well perfused  Neurological Examination Mental Status: Alert, fully oriented, thought content appropriate.  Speech fluent without evidence of aphasia.  Able to follow allcommands without difficulty. Cranial Nerves: II: Visual fields grossly normal without extinction to DSS.   III,IV, VI: EOMI VII: Smile symmetric VIII: hearing intact to voice IX,X: No hypophonia XI: Symmetric XII: Midline tongue extension Motor: Right : Upper extremity   5/5    Left:     Upper extremity   5/5  Lower extremity   5/5     Lower extremity   5/5 Sensory: Light touch intact throughout, bilaterally Deep Tendon Reflexes: 2+ and symmetric throughout Cerebellar: No ataxia with FNF bilaterally  Gait: Deferred   Lab Results: Basic Metabolic Panel: Recent Labs  Lab 11/16/20 1552 11/17/20 1445 11/18/20 0545  NA 137 132* 134*  K 3.9 3.1* 3.2*  CL 93* 93* 100  CO2 23 23 25   GLUCOSE 170* 276* 404*  BUN 6 7 7   CREATININE 1.80* 2.11* 1.81*  CALCIUM 8.7 8.0* 7.2*  MG 1.4*  --   --      CBC: Recent Labs  Lab 11/16/20 1552 11/17/20 1445 11/18/20 0545  WBC 8.9 7.9 7.2  NEUTROABS 5.4  --   --   HGB 11.9* 11.6* 9.4*  HCT 35.7* 36.0* 28.4*  MCV 93 93.3 91.6  PLT 668* 584* 409*    Cardiac Enzymes: No results for input(s): CKTOTAL, CKMB, CKMBINDEX, TROPONINI in the last 168 hours.  Lipid Panel: No results for input(s): CHOL, TRIG, HDL, CHOLHDL, VLDL, LDLCALC in the last 168 hours.  Imaging: CT HEAD WO CONTRAST  Result Date: 11/18/2020 CLINICAL DATA:  Syncope, recurrent. EXAM: CT HEAD WITHOUT CONTRAST TECHNIQUE: Contiguous axial images were obtained from the base of the skull through the vertex without intravenous contrast. COMPARISON:  Prior head CT examinations 10/31/2020 and earlier. Brain MRI 08/27/2016. FINDINGS: Brain: Moderate cerebral and cerebellar atrophy. There is no acute intracranial hemorrhage. No demarcated cortical infarct. No extra-axial fluid collection. No evidence of intracranial mass. No midline shift. Vascular: No hyperdense vessel.  Atherosclerotic calcifications Skull: Normal. Negative for fracture or focal lesion. Sinuses/Orbits: Visualized orbits show no acute finding. No significant paranasal sinus disease at the imaged levels. Other: Right mastoid effusion. IMPRESSION: No evidence of acute intracranial abnormality. Moderate cerebral and cerebellar atrophy, stable as compared to the head CT of 10/31/2020. Right mastoid effusion. Electronically Signed   By: Kellie Simmering DO   On: 11/18/2020 13:59   DG Chest Port 1 View  Result Date: 11/17/2020 CLINICAL DATA:  Pt from IM office across the street for further evaluation of  multiple seizures for the past couple weeks, reports compliance with keppra. Pt also hypotensive, SBP 70s in triage, pt a.o EXAM: PORTABLE CHEST 1 VIEW COMPARISON:  10/31/2020. FINDINGS: The heart size and mediastinal contours are within normal limits. Both lungs are clear. No pleural effusion or pneumothorax. The visualized skeletal  structures are intact. IMPRESSION: No active disease. Electronically Signed   By: Lajean Manes M.D.   On: 11/17/2020 16:38    Assessment: 55 year old male with a history of seizures and recurrent episodes of passing out 1. Overall impression is that the spells are epileptic in nature. Autonomic effects such as hypotension can occur with some seizures.  2. Per patient, the frequency of his spells increased significantly after valproic acid was switched to Keppra. The patient states that the switch was mad about 2 weeks ago. Chart review reveals that at his visit to see his Neurologist Dr. [name] in June of 2021, the plan was to switch valproate to Lamictal - however, the note does not document the reason why.  3. Of note, plan at the time of his last admission on 1/25 was as follows per Family Medicine team: "Patient with a history of seizures, unclear whether these are related to alcohol withdrawal.  Home meds include Depakote 1000 mg daily per chart review -Continue home Depakote pending formal med rec. Management of alcohol withdrawal as above". Apparently he had never made the switch to Lamictal planned by Dr. [name] in June 2021. Family Medicine then switched Depakote to [place] which the patient has been taking for the past two weeks since last discharge.   Recommendations: 1. Follow up with Dr. [name] for possible medication adjustment. May benefit from being switched back to Depakote, but will defer to Dr. [name], who is the patient's primary neurologist. Of note, he is documented to have had good seizure control on Depakote ER 500 mg BID in Dr. Rhea Belton June 2021 clinic note. Again, it is unclear why it had been planned to switch Depakote to Lamictal despite his good response to Depakote.  2. EEG has been ordered 3. Keppra level has been ordered.   Electronically signed: Dr. Kerney Elbe 11/18/2020, 2:15 PM

## 2020-11-19 DIAGNOSIS — I9589 Other hypotension: Secondary | ICD-10-CM

## 2020-11-19 LAB — CBC
HCT: 26.4 % — ABNORMAL LOW (ref 39.0–52.0)
Hemoglobin: 9.1 g/dL — ABNORMAL LOW (ref 13.0–17.0)
MCH: 31.3 pg (ref 26.0–34.0)
MCHC: 34.5 g/dL (ref 30.0–36.0)
MCV: 90.7 fL (ref 80.0–100.0)
Platelets: 386 10*3/uL (ref 150–400)
RBC: 2.91 MIL/uL — ABNORMAL LOW (ref 4.22–5.81)
RDW: 17.1 % — ABNORMAL HIGH (ref 11.5–15.5)
WBC: 6.7 10*3/uL (ref 4.0–10.5)
nRBC: 0 % (ref 0.0–0.2)

## 2020-11-19 LAB — BASIC METABOLIC PANEL
Anion gap: 9 (ref 5–15)
BUN: 10 mg/dL (ref 6–20)
CO2: 24 mmol/L (ref 22–32)
Calcium: 7.4 mg/dL — ABNORMAL LOW (ref 8.9–10.3)
Chloride: 103 mmol/L (ref 98–111)
Creatinine, Ser: 1.55 mg/dL — ABNORMAL HIGH (ref 0.61–1.24)
GFR, Estimated: 52 mL/min — ABNORMAL LOW (ref >60)
Glucose, Bld: 258 mg/dL — ABNORMAL HIGH (ref 70–99)
Potassium: 3.9 mmol/L (ref 3.5–5.1)
Sodium: 136 mmol/L (ref 135–145)

## 2020-11-19 LAB — GLUCOSE, CAPILLARY
Glucose-Capillary: 272 mg/dL — ABNORMAL HIGH (ref 70–99)
Glucose-Capillary: 175 mg/dL — ABNORMAL HIGH (ref 70–99)
Glucose-Capillary: 359 mg/dL — ABNORMAL HIGH (ref 70–99)
Glucose-Capillary: 161 mg/dL — ABNORMAL HIGH (ref 70–99)

## 2020-11-19 MED ORDER — SODIUM CHLORIDE 0.9 % IV BOLUS
1000.0000 mL | Freq: Once | INTRAVENOUS | Status: AC
  Administered 2020-11-19: 1000 mL via INTRAVENOUS

## 2020-11-19 MED ORDER — INSULIN ASPART 100 UNIT/ML ~~LOC~~ SOLN
0.0000 [IU] | Freq: Three times a day (TID) | SUBCUTANEOUS | Status: DC
  Administered 2020-11-19: 15 [IU] via SUBCUTANEOUS
  Administered 2020-11-19: 3 [IU] via SUBCUTANEOUS

## 2020-11-19 NOTE — Plan of Care (Signed)

## 2020-11-19 NOTE — Progress Notes (Signed)
Family Medicine Teaching Service Daily Progress Note Intern Pager: [phone]  Patient name: Jeffrey [name] Medical record number: [id number] Date of birth: 29-Nov-1965 Age: 55 y.o. Gender: male  Primary Care Provider: [name], MD Consultants: None Code Status: Full  Pt Overview and Major Events to Date:  01/11- Admitted to hospital  Assessment and Plan: Jeffrey [name] is a 54 y.o. male presenting with multiple episodes of weakness and shaking while walking. PMH is significant for cirrhosis, alcohol use disorder, T2DM, anoxic brain injury s/p cardiac arrest, HTN, HLD, OSA, CKD, seizure disorder.  Syncopal Episodes Echo shows an EF of 70-75% with a hyperdynamic left ventricle.  Physical therapy was able to orthostatic vitals which demonstrated a drop of 34 mmHg when moving from a seated to standing position.  It sounds like he did not experience significant symptoms of orthostatic hypotension during these exercises.  Neurology is concerned that these episodes may be related to seizure activity.  They have advised continuing his Keppra but would like him to return to his outpatient neurologist to consider transition back to Depakote. -Neuro consult, appreciate recommendations -Follow-up EEG -Continue to hold antihypertensives -PT/OT following -Ambulate patient  Seizure Disorder -Keppra 500 twice daily  Hypotension  Hx of HTN Blood pressures appear to have returned to within normal limits.  Per physical therapy, he does demonstrate labile blood pressures with activity. -Continue to hold home medications   AKI on CKD-improving Baseline creatinine 1.0.  Creatinine 2.1 on admission now downtrending to 1.5. -Continue IV fluids -Encourage po fluids -Continue to hold Losartan -Trend creatinine  Recent GI Bleed 2/2 Severe Erosive Esophagitis No signs of bleeding.  Hbg 9.4. -Continue Protonix  -Trend hemoglobin  Hypokalemia -Replete as needed -BMP in am  Hx of  Cirrhosis  Alcohol Use Disorder -Continue Naltrexone -Daily Thiamine, Folic acid and multivitamins  T2DM Home Metformin and Farxiga.  Holding home medications.Blood glucose has been ranging 218-303 in the past 24 hours. -Increase to moderate sliding scale insulin -Continue CBG monitoring  Depression -Continue Mirtazapine  OSA on CPAP -CPAP at night  HLD  s/p cardiac arrest -Continue home meds  FEN/GI: Regular diet Prophylaxis: Lovenox    Status is: Observation  The patient remains OBS appropriate and will d/c before 2 midnights.  Dispo: The patient is from: Home              Anticipated d/c is to: Home              Anticipated d/c date is: 1 day              Patient currently is not medically stable to d/c.   Difficult to place patient No    Subjective:  No acute events overnight.  He reports he has not had any syncopal/seizure-like episodes during his hospitalization so far.  He notes that he did not walk around with physical therapy yesterday he only stood up.  He would like to feel more comfortable moving and walking around prior to discharge home.  No other concerns at this time.  Objective: Temp:  [97.5 F (36.4 C)-99.1 F (37.3 C)] 98.9 F (37.2 C) (02/13 0438) Pulse Rate:  [87-97] 88 (02/13 0438) Resp:  [17-18] 17 (02/13 0438) BP: (98-156)/(63-104) 119/78 (02/13 0438) SpO2:  [88 %-100 %] 99 % (02/12 2328) Weight:  [86.8 kg] 86.8 kg (02/13 0500)  Physical Exam:  General: Alert and cooperative and appears to be in no acute distress. seated comfortably in bed eating breakfast. Cardio: Normal S1  and S2, no S3 or S4. Rhythm is regular. No murmurs or rubs.  Pulm: Clear to auscultation bilaterally, no crackles, wheezing, or diminished breath sounds. Normal respiratory effort Abdomen: Bowel sounds normal. Abdomen soft and non-tender.  Extremities: No peripheral edema. Warm/ well perfused.  Strong radial pulses. Neuro: Cranial nerves grossly  intact   Laboratory: Recent Labs  Lab 11/17/20 1445 11/18/20 0545 11/19/20 0424  WBC 7.9 7.2 6.7  HGB 11.6* 9.4* 9.1*  HCT 36.0* 28.4* 26.4*  PLT 584* 409* 386   Recent Labs  Lab 11/17/20 1445 11/18/20 0545 11/19/20 0424  NA 132* 134* 136  K 3.1* 3.2* 3.9  CL 93* 100 103  CO2 23 25 24   BUN 7 7 10   CREATININE 2.11* 1.81* 1.55*  CALCIUM 8.0* 7.2* 7.4*  GLUCOSE 276* 404* 258*      Imaging/Diagnostic Tests: CT HEAD WO CONTRAST  Result Date: 11/18/2020 CLINICAL DATA:  Syncope, recurrent. EXAM: CT HEAD WITHOUT CONTRAST TECHNIQUE: Contiguous axial images were obtained from the base of the skull through the vertex without intravenous contrast. COMPARISON:  Prior head CT examinations 10/31/2020 and earlier. Brain MRI 08/27/2016. FINDINGS: Brain: Moderate cerebral and cerebellar atrophy. There is no acute intracranial hemorrhage. No demarcated cortical infarct. No extra-axial fluid collection. No evidence of intracranial mass. No midline shift. Vascular: No hyperdense vessel.  Atherosclerotic calcifications Skull: Normal. Negative for fracture or focal lesion. Sinuses/Orbits: Visualized orbits show no acute finding. No significant paranasal sinus disease at the imaged levels. Other: Right mastoid effusion. IMPRESSION: No evidence of acute intracranial abnormality. Moderate cerebral and cerebellar atrophy, stable as compared to the head CT of 10/31/2020. Right mastoid effusion. Electronically Signed   By: Kellie Simmering DO   On: 11/18/2020 13:59   ECHOCARDIOGRAM COMPLETE  Result Date: 11/18/2020    ECHOCARDIOGRAM REPORT   Patient Name:   Jeffrey [name] [facility] Date of Exam: 11/18/2020 Medical Rec #:  [id number]        Height:       70.0 in Accession #:    [id number]       Weight:       239.2 lb Date of Birth:  06/10/1966        BSA:          2.252 m Patient Age:    44 years         BP:           115/83 mmHg Patient Gender: M                HR:           84 bpm. Exam Location:  Inpatient Procedure:  2D Echo, Color Doppler and Cardiac Doppler Indications:    R55 Syncope  History:        Patient has prior history of Echocardiogram examinations, most                 recent 07/28/2017. Risk Factors:Hypertension, Diabetes,                 Dyslipidemia and Sleep Apnea.  Sonographer:    Raquel Sarna Senior RDCS Referring Phys: [id number] [place]  1. Left ventricular ejection fraction, by estimation, is 70 to 75%. The left ventricle has hyperdynamic function. The left ventricle has no regional wall motion abnormalities. There is mild concentric left ventricular hypertrophy. Left ventricular diastolic parameters are consistent with Grade I diastolic dysfunction (impaired relaxation).  2. Right ventricular systolic function is normal. The right ventricular size is  normal. There is normal pulmonary artery systolic pressure.  3. The mitral valve is grossly normal. Trivial mitral valve regurgitation.  4. The aortic valve is tricuspid. There is mild thickening of the aortic valve. Aortic valve regurgitation is not visualized.  5. Aortic dilatation noted. There is mild dilatation of the ascending aorta, measuring 39 mm. Comparison(s): No significant change from prior study. FINDINGS  Left Ventricle: Left ventricular ejection fraction, by estimation, is 70 to 75%. The left ventricle has hyperdynamic function. The left ventricle has no regional wall motion abnormalities. The left ventricular internal cavity size was normal in size. There is mild concentric left ventricular hypertrophy. Left ventricular diastolic parameters are consistent with Grade I diastolic dysfunction (impaired relaxation). Right Ventricle: The right ventricular size is normal. Right vetricular wall thickness was not well visualized. Right ventricular systolic function is normal. There is normal pulmonary artery systolic pressure. The tricuspid regurgitant velocity is 2.29 m/s, and with an assumed right atrial pressure of 3 mmHg, the estimated  right ventricular systolic pressure is 93.2 mmHg. Left Atrium: Left atrial size was normal in size. Right Atrium: Right atrial size was normal in size. Pericardium: There is no evidence of pericardial effusion. Mitral Valve: The mitral valve is grossly normal. There is mild thickening of the mitral valve leaflet(s). Trivial mitral valve regurgitation. Tricuspid Valve: The tricuspid valve is normal in structure. Tricuspid valve regurgitation is trivial. Aortic Valve: The aortic valve is tricuspid. There is mild thickening of the aortic valve. Aortic valve regurgitation is not visualized. Pulmonic Valve: The pulmonic valve was normal in structure. Pulmonic valve regurgitation is trivial. Aorta: Aortic dilatation noted. There is mild dilatation of the ascending aorta, measuring 39 mm. IAS/Shunts: No atrial level shunt detected by color flow Doppler.  LEFT VENTRICLE PLAX 2D LVIDd:         4.00 cm  Diastology LVIDs:         2.20 cm  LV e' medial:    5.22 cm/s LV PW:         1.10 cm  LV E/e' medial:  12.1 LV IVS:        1.20 cm  LV e' lateral:   8.05 cm/s LVOT diam:     2.20 cm  LV E/e' lateral: 7.8 LV SV:         89 LV SV Index:   40 LVOT Area:     3.80 cm  RIGHT VENTRICLE RV S prime:     15.10 cm/s TAPSE (M-mode): 1.8 cm LEFT ATRIUM             Index       RIGHT ATRIUM           Index LA diam:        3.70 cm 1.64 cm/m  RA Area:     14.40 cm LA Vol (A2C):   46.4 ml 20.60 ml/m RA Volume:   34.60 ml  15.36 ml/m LA Vol (A4C):   44.8 ml 19.89 ml/m LA Biplane Vol: 47.5 ml 21.09 ml/m  AORTIC VALVE LVOT Vmax:   106.00 cm/s LVOT Vmean:  73.700 cm/s LVOT VTI:    0.235 m  AORTA Ao Root diam: 3.10 cm Ao Asc diam:  3.90 cm MITRAL VALVE               TRICUSPID VALVE MV Area (PHT): 3.30 cm    TR Peak grad:   21.0 mmHg MV Decel Time: 230 msec    TR Vmax:  229.00 cm/s MV E velocity: 63.00 cm/s MV A velocity: 70.70 cm/s  SHUNTS MV E/A ratio:  0.89        Systemic VTI:  0.24 m                            Systemic Diam: 2.20  cm Gwyndolyn Kaufman MD Electronically signed by Gwyndolyn Kaufman MD Signature Date/Time: 11/18/2020/2:25:37 PM    Final     Matilde Haymaker, MD 11/19/2020, 7:09 AM PGY-3, [place] Intern pager: [phone], text pages welcome

## 2020-11-20 ENCOUNTER — Inpatient Hospital Stay (HOSPITAL_COMMUNITY): Payer: 59

## 2020-11-20 ENCOUNTER — Ambulatory Visit: Payer: 59 | Admitting: Family Medicine

## 2020-11-20 DIAGNOSIS — K2100 Gastro-esophageal reflux disease with esophagitis, without bleeding: Secondary | ICD-10-CM

## 2020-11-20 DIAGNOSIS — R630 Anorexia: Secondary | ICD-10-CM

## 2020-11-20 LAB — BASIC METABOLIC PANEL
Anion gap: 8 (ref 5–15)
Anion gap: 9 (ref 5–15)
BUN: 9 mg/dL (ref 6–20)
BUN: 9 mg/dL (ref 6–20)
CO2: 24 mmol/L (ref 22–32)
CO2: 23 mmol/L (ref 22–32)
Calcium: 7.3 mg/dL — ABNORMAL LOW (ref 8.9–10.3)
Calcium: 7.7 mg/dL — ABNORMAL LOW (ref 8.9–10.3)
Chloride: 107 mmol/L (ref 98–111)
Chloride: 104 mmol/L (ref 98–111)
Creatinine, Ser: 1.17 mg/dL (ref 0.61–1.24)
Creatinine, Ser: 1.18 mg/dL (ref 0.61–1.24)
GFR, Estimated: >60 mL/min (ref >60)
GFR, Estimated: >60 mL/min (ref >60)
Glucose, Bld: 40 mg/dL — CL (ref 70–99)
Glucose, Bld: 223 mg/dL — ABNORMAL HIGH (ref 70–99)
Potassium: 2.7 mmol/L — CL (ref 3.5–5.1)
Potassium: 3.9 mmol/L (ref 3.5–5.1)
Sodium: 139 mmol/L (ref 135–145)
Sodium: 136 mmol/L (ref 135–145)

## 2020-11-20 LAB — URINALYSIS, ROUTINE W REFLEX MICROSCOPIC
Bilirubin Urine: NEGATIVE
Glucose, UA: >=500 mg/dL — AB
Ketones, ur: 5 mg/dL — AB
Leukocytes,Ua: NEGATIVE
Nitrite: NEGATIVE
Protein, ur: NEGATIVE mg/dL
Specific Gravity, Urine: 1.024 (ref 1.005–1.030)
pH: 5 (ref 5.0–8.0)

## 2020-11-20 LAB — CBC
HCT: 25.9 % — ABNORMAL LOW (ref 39.0–52.0)
Hemoglobin: 8.5 g/dL — ABNORMAL LOW (ref 13.0–17.0)
MCH: 30.5 pg (ref 26.0–34.0)
MCHC: 32.8 g/dL (ref 30.0–36.0)
MCV: 92.8 fL (ref 80.0–100.0)
Platelets: 339 10*3/uL (ref 150–400)
RBC: 2.79 MIL/uL — ABNORMAL LOW (ref 4.22–5.81)
RDW: 16.8 % — ABNORMAL HIGH (ref 11.5–15.5)
WBC: 7.5 10*3/uL (ref 4.0–10.5)
nRBC: 0 % (ref 0.0–0.2)

## 2020-11-20 LAB — MAGNESIUM: Magnesium: 1.1 mg/dL — ABNORMAL LOW (ref 1.7–2.4)

## 2020-11-20 LAB — GLUCOSE, CAPILLARY
Glucose-Capillary: 74 mg/dL (ref 70–99)
Glucose-Capillary: 139 mg/dL — ABNORMAL HIGH (ref 70–99)
Glucose-Capillary: 212 mg/dL — ABNORMAL HIGH (ref 70–99)
Glucose-Capillary: 282 mg/dL — ABNORMAL HIGH (ref 70–99)
Glucose-Capillary: 249 mg/dL — ABNORMAL HIGH (ref 70–99)

## 2020-11-20 MED ORDER — POTASSIUM CHLORIDE 10 MEQ/100ML IV SOLN
10.0000 meq | INTRAVENOUS | Status: AC
Start: 2020-11-20 — End: 2020-11-20
  Administered 2020-11-20 (×4): 10 meq via INTRAVENOUS
  Filled 2020-11-20 (×4): qty 100

## 2020-11-20 MED ORDER — LEVETIRACETAM 500 MG PO TABS
500.0000 mg | ORAL_TABLET | Freq: Two times a day (BID) | ORAL | Status: DC
  Administered 2020-11-20 – 2020-11-21 (×2): 500 mg via ORAL
  Filled 2020-11-20 (×2): qty 1

## 2020-11-20 MED ORDER — POTASSIUM CHLORIDE CRYS ER 20 MEQ PO TBCR
40.0000 meq | EXTENDED_RELEASE_TABLET | Freq: Two times a day (BID) | ORAL | Status: AC
  Administered 2020-11-20 (×2): 40 meq via ORAL
  Filled 2020-11-20 (×2): qty 2

## 2020-11-20 MED ORDER — INSULIN ASPART 100 UNIT/ML ~~LOC~~ SOLN
0.0000 [IU] | Freq: Three times a day (TID) | SUBCUTANEOUS | Status: DC
  Administered 2020-11-20: 5 [IU] via SUBCUTANEOUS
  Administered 2020-11-20: 3 [IU] via SUBCUTANEOUS
  Administered 2020-11-21: 9 [IU] via SUBCUTANEOUS
  Administered 2020-11-21: 3 [IU] via SUBCUTANEOUS
  Administered 2020-11-21: 5 [IU] via SUBCUTANEOUS
  Administered 2020-11-22: 3 [IU] via SUBCUTANEOUS
  Administered 2020-11-22: 9 [IU] via SUBCUTANEOUS

## 2020-11-20 MED ORDER — ONDANSETRON HCL 4 MG PO TABS
4.0000 mg | ORAL_TABLET | Freq: Three times a day (TID) | ORAL | Status: DC
  Administered 2020-11-20 – 2020-11-22 (×5): 4 mg via ORAL
  Filled 2020-11-20 (×6): qty 1

## 2020-11-20 NOTE — Progress Notes (Signed)
Family Medicine Teaching Service Daily Progress Note Intern Pager: [phone]  Patient name: Jeffrey [name] Medical record number: [id number] Date of birth: 1966/03/02 Age: 55 y.o. Gender: male  Primary Care Provider: [name], MD Consultants: None Code Status: Full  Pt Overview and Major Events to Date:  02/11- Admitted to hospital  Assessment and Plan: [name] is a 55 y.o. male presenting with multiple episodes of weakness and shaking while walking. PMH is significant for cirrhosis, alcohol use disorder, T2DM, anoxic brain injury s/p cardiac arrest, HTN, HLD, OSA, CKD, seizure disorder.  Syncopal Episodes Echo shows an EF of 70-75% with a hyperdynamic left ventricle. Orthostatic vitals show drop of 34 mmHg when sitting > standing, but no significant symptoms. Neuro concerned these might be related to seizure activity. Medications recently switched from Valproate to [place] 2 weeks prior to admission.  -Neuro consult: follow up outpatient for medication regimen with Dr. [name] -EEG showed no seizures or epileptiform discharges seen -Keppra level pending  -Continue to hold antihypertensives -PT/OT recs inpatient therapy while admitted, no need for outpatient PT -Ambulate patient as tolerated, increased time between positional changes -Repeat orthostatic vitals -Continue IV fluids until orthostatic symptoms seem to have resolved.  Seizure Disorder  Normal EEG 2/14 Previously well-controlled on Depakote ER 500mg  BID, but switched to Keppra two weeks ago and has increased syncopal episodes.  -Keppra 500mg  BID, level pending, follow-up with neuro outpatient  Anorexia Patient admits to little PO intake 2/2 no appetite over last 6 months. This is likely due to recent diagnosis of esophagitis and severe duodenitis. Endorses a 40 lb weight loss over last 6 months, and per chart review was 121kg in August 2021, now 83.9kg.  -GI recs: discontinue naltrexone, could be contributing  to decreased appetite; repeat EGD    Hx of HTN with Hypotension on Admission Blood pressures stable. Per physical therapy, he does demonstrate labile blood pressures with activity. -Continue to hold home medications due to orthostasis    AKI on CKD, improving Baseline creatinine 1.0. Admission Cr 2.1>1.17. -Continue IV fluids 125cc/hr  -Encourage PO intake -Daily BMP  Recent GI Bleed 2/2 Severe Erosive Esophagitis No current signs of bleeding. Admission Hgb 11.6>8.5.  Most likely secondary to hemoconcentration on presentation to the hospital.  We will also discuss this with GI. -Follow-up GI consult -Continue Protonix  -Daily CBC  Hypokalemia, resolved K+ 2.7 on 2/14 am, gave 40 kdur 4x IV, level normal at 3.9   Hx of Cirrhosis  Alcohol Use Disorder Per GI note, unsure if patient is taking naltrexone and could be linked to nausea. Patient has decreased drinking since admission in January, but unsure of compliance.  -Continue naltrexone, daily thiamine, folic acid and multivitamins  T2DM Home Metformin and Farxiga. One random level 40, up to 74 without intervention. Otherwise, blood glucose 139-175 over the past 24 hours. -Changed moderate SSI to sensitive SSI due to hypoglycemia event -CBG monitoring  Depression -Continue home Mirtazapine 30mg  qhs  OSA on CPAP -CPAP at night  HLD  S/p cardiac arrest -Continue home ASA 81mg  qd, atorvastatin 80mg  qd  FEN/GI: Regular diet Prophylaxis: Lovenox 40mg  subcut  Status is: Inpatient, FPTS, attending Dr. Thompson Grayer  Subjective:  Patient seen at bedside this morning. EEG was being prepared. Patient endorses no acute complaints. Denies feeling lightheaded but states he has not been out of bed much. No chest pain, SOB. Limited PO intake 2/2 no appetite. No vomiting.   Objective: Temp:  [97.4 F (36.3 C)-98.2 F (36.8  C)] 97.4 F (36.3 C) (02/14 1242) Pulse Rate:  [83-100] 100 (02/14 1242) Resp:  [16-20] 16 (02/14 1242) BP:  (118-155)/(76-108) 149/105 (02/14 1242) SpO2:  [100 %] 100 % (02/14 1242) Weight:  [83.9 kg] 83.9 kg (02/14 0500)  Physical Exam:  General: 55 y.o. male in NAD, resting in bed. Cardio: Tachycardic but regular rhythm without murmurs  Lungs: Clear bilaterally, no wheezing or rales Abdomen: Soft, nontender and nondistended Skin: Warm and dry Extremities: No edema noted   Laboratory: Recent Labs  Lab 11/18/20 0545 11/19/20 0424 11/20/20 0246  WBC 7.2 6.7 7.5  HGB 9.4* 9.1* 8.5*  HCT 28.4* 26.4* 25.9*  PLT 409* 386 339   Recent Labs  Lab 11/19/20 0424 11/20/20 0246 11/20/20 1111  NA 136 139 136  K 3.9 2.7* 3.9  CL 103 107 104  CO2 24 24 23   BUN 10 9 9   CREATININE 1.55* 1.17 1.18  CALCIUM 7.4* 7.3* 7.7*  GLUCOSE 258* 40* 223*     Imaging/Diagnostic Tests: EEG 11/20/2020 This study is within normal limits. No seizures or epileptiform discharges were seen throughout the recording.  DG Chest 1 View 11/17/2020 IMPRESSION: No active disease.  CT Head WO Contrast 11/17/2020 IMPRESSION: 1. No evidence of acute intracranial abnormality. 2. Moderate cerebral and cerebellar atrophy, stable as compared to the head CT of 10/31/2020. 3. Right mastoid effusion.    Karle Plumber, Medical Student 11/20/2020, 1:50 PM Acting Intern, [place] Intern pager: [phone], text pages welcome  FPTS Upper-Level Resident Addendum   I have independently interviewed and examined the patient. I have discussed the above with the original author and agree with their documentation. My edits for correction/addition/clarification are in blue. Please see also any attending notes.    Matilde Haymaker MD PGY-3, [facility] 11/20/2020 7:08 PM  [place] Service pager: [phone] (text pages welcome through [facility])

## 2020-11-20 NOTE — Procedures (Signed)
Patient Name: Jeffrey [name]  MRN: [id number]  Epilepsy Attending: Lora Havens  Referring Physician/Provider: Dr Carollee Leitz Date: 11/20/2020 Duration: 32.11 mins  Patient history: 55 year old male with history of seizures and recurrent episodes of passing out.  EEG to evaluate for seizures.  Level of alertness: Awake, drowsy  AEDs during EEG study: Keppra  Technical aspects: This EEG study was done with scalp electrodes positioned according to the 10-20 International system of electrode placement. Electrical activity was acquired at a sampling rate of 500Hz  and reviewed with a high frequency filter of 70Hz  and a low frequency filter of 1Hz . EEG data were recorded continuously and digitally stored.   Description: The posterior dominant rhythm consists of 8 Hz activity of moderate voltage (25-35 uV) seen predominantly in posterior head regions, symmetric and reactive to eye opening and eye closing. Drowsiness was characterized by attenuation of the posterior background rhythm. Physiologic photic driving was not seen during photic stimulation.  Hyperventilation was not performed.     IMPRESSION: This study is within normal limits. No seizures or epileptiform discharges were seen throughout the recording.  [name] Barbra Sarks

## 2020-11-20 NOTE — Hospital Course (Addendum)
Jeffrey [name] is a 55 y.o. male presented to the ED with multiple episodes of weakness and shaking while walking. PMH is significant for cirrhosis, alcohol use disorder, T2DM, anoxic brain injury s/p cardiac arrest, HTN, HLD, OSA, CKD, seizure disorder.   Syncopal Episodes Echo shows an EF of 70-75% with a hyperdynamic left ventricle. Hypotensive on presentation which resolved with adequate IVF and encouraged PO intake. Orthostatic vitals remained positive throughout admission. EEG was negative. PT/OT does not recommend outpatient therapy.  Seizure Disorder  Normal EEG 2/14 Previously well-controlled on Depakote ER 500mg  BID, but switched to Keppra two weeks prior to admission and has increased syncopal episodes. Received 1000mg  Keppra BID while admitted. Follow up with Dr. [name] outpatient for medication regimen    Anorexia  Weight Loss Patient admits to little PO intake 2/2 no appetite over last 6 months. Weight was 121kg in August 2021, 83.9kg on admission. EGD in January showed esophagitis and duodenitis. Additionally, patient is following with Heme-Onc for MGUS diagnosis in 05/2020. There is slight concern for underlying etiology and would consider further investigation in the outpatient setting.   Hx of Cirrhosis  Alcohol Use Disorder Per GI note, unsure if patient is taking naltrexone which was prescribed in January, discontinued. Patient has decreased drinking. No CIWA protocol initiated this admission, and patient remained stable without signs of withdrawal.

## 2020-11-20 NOTE — Progress Notes (Signed)
Physical Therapy Treatment Patient Details Name: Jeffrey [name] MRN: [id number] DOB: [birth year] Today's Date: 11/20/2020    History of Present Illness 55 y.o. male presenting with multiple episodes of weakness and shaking while walking. High suspicion for hypotension due to poor p.o. intake. PMH is significant for cirrhosis, alcohol use disorder, T2DM, anoxic brain injury s/p cardiac arrest, HTN, HLD, OSA, CKD, seizure disorder.    PT Comments    Patient progressing slowly towards PT goals. Tolerated standing and ambulation with supervision for safety. Pt reports feeling weak since he has not been up moving except to go to the bathroom. Continues to have hypotension initially with standing but asymptomatic. Sitting BP 150/106, HR 104 bpm, standing BP 115/80, HR 106 bpm, sitting BP post activity 158/118. RN made aware. Encouraged increasing activity while in the hospital and walking a few times daily. Anticipate pt's mobility and strength will improve quickly with increased activity. Will follow.   Follow Up Recommendations  No PT follow up     Equipment Recommendations  None recommended by PT    Recommendations for Other Services       Precautions / Restrictions Precautions Precautions: Fall Precaution Comments: orthostatic BP Restrictions Weight Bearing Restrictions: No    Mobility  Bed Mobility               General bed mobility comments: Sitting EOB upon PT arrival.    Transfers Overall transfer level: Modified independent Equipment used: None             General transfer comment: Stood from EOB x2, no dizziness or lOB, slow to rise.  Ambulation/Gait Ambulation/Gait assistance: Supervision Gait Distance (Feet): 100 Feet Assistive device: IV Pole Gait Pattern/deviations: Step-through pattern Gait velocity: reduced Gait velocity interpretation: <1.31 ft/sec, indicative of household ambulator General Gait Details: Very slow, guarded gait pushing IV pole  for support; no evidence of imbalance but reports weakness. No dizziness   Stairs             Wheelchair Mobility    Modified Rankin (Stroke Patients Only)       Balance Overall balance assessment: Mild deficits observed, not formally tested                                          Cognition Arousal/Alertness: Awake/alert Behavior During Therapy: Flat affect Overall Cognitive Status: Within Functional Limits for tasks assessed                                 General Comments: appears [facility] for basic mobility tasks.      Exercises      General Comments General comments (skin integrity, edema, etc.): Sitting BP150/106, HR 104 bpm, standing BP 115/80, HR 106 bpm, sitting BP post activity 158/118.      Pertinent Vitals/Pain Pain Assessment: No/denies pain    Home Living                      Prior Function            PT Goals (current goals can now be found in the care plan section) Progress towards PT goals: Progressing toward goals    Frequency    Min 3X/week      PT Plan Current plan remains appropriate    Co-evaluation  AM-PAC PT "6 Clicks" Mobility   Outcome Measure  Help needed turning from your back to your side while in a flat bed without using bedrails?: None Help needed moving from lying on your back to sitting on the side of a flat bed without using bedrails?: None Help needed moving to and from a bed to a chair (including a wheelchair)?: None Help needed standing up from a chair using your arms (e.g., wheelchair or bedside chair)?: None Help needed to walk in hospital room?: A Little Help needed climbing 3-5 steps with a railing? : A Little 6 Click Score: 22    End of Session   Activity Tolerance: Treatment limited secondary to medical complications (Comment);Patient tolerated treatment well (drop in BP) Patient left: Other (comment) (sitting on toilet, RN present and  aware) Nurse Communication: Mobility status;Other (comment) (pt on toilet in bathroom) PT Visit Diagnosis: Other abnormalities of gait and mobility (R26.89)     Time: 7225-7505 PT Time Calculation (min) (ACUTE ONLY): 17 min  Charges:  $Gait Training: 8-22 mins                     Marisa Severin, PT, DPT Acute Rehabilitation Services Pager [phone] Office [phone]       [place] 11/20/2020, 2:56 PM

## 2020-11-20 NOTE — Progress Notes (Signed)
EEG complete - results pending 

## 2020-11-20 NOTE — Consult Note (Addendum)
[facility] Consult: 11:25 AM 11/20/2020  LOS: 2 days    Referring Provider: Dr Matilde Haymaker  Primary Care Physician:  [name], MD Primary Gastroenterologist:  Althia Forts.  Seen by both [facility] and [facility] in past.      Reason for Consultation:  Anorexia. Nausea.  Esophagitis.  Dehydration.       HPI: Jeffrey [name] is a 55 y.o. male.  PMH chronic alcoholism.  Esophagitis.  Anemia.  Transfusions of 4 PRBCs in 11/2012.  Cirrhosis of the liver.  DM2.  OSA.  Anoxic encephalopathy following PEA cardiac arrest 2014.  Peripheral neuropathy  11/2012 EGD. For coffee-ground emesis, anemia.  Linear ulcer at EG junction. Moderate hiatal hernia.  Otherwise normal study 10/2014 EGD.  Melena, hematemesis.  LA grade C esophagitis.  5 cm hiatal hernia.  Mild duodenitis. 06/2020 abdominal ultrasound: Coarse heterogeneous liver texture consistent with cirrhosis.  Gallbladder contraction and mild wall thickening, likely secondary to under distention.  PV patent with normal directional flow.  CBD 3 mm. 11/01/20 EGD for dark stools, dark emesis, EtOH level 234, Hgb 9.7, INR 2: Severe erosive esophagitis without active bleeding.  No varices.  Patchy superficial gastric erosions, no gastric varices or portal hypertensive gastropathy, biopsies obtained.  Severe duodenal ulceration a/w black eschar starting at the distal bulb extending to third portion, biopsies obtained but no active bleeding or bleeding stigmata. 11/01/2020 CTAP with angiography: No significant vascular findings.  Thickening of D2 and D3 consistent with known duodenitis.  Pancreatic atrophy and diffuse calcifications consistent with chronic pancreatitis.  Hepatic steatosis.  Bil gynecomastia.  Small fat-containing umbilical hernia.  At most recent discharge last month,  he was prescribed Protonix 40 mg bid for 28 days followed by 40 mg daily, naltrexone 50 mg daily, and he restarted ASA 81 mg daily.  His valproic acid was replaced by Keppra.  However his outpt neurologist had planned to switch from valproic acid to Lamictal though it is unclear why the teaching service had switched him to Ray.  Patient presented to the ED on 2/11 for evaluation of what he thought were multiple seizures over the past few weeks despite compliance with Keppra.  These events consist of syncope along with seizure-like activity, uncontrollable jerking of the limbs.  Sometimes able to control the jerking/shaking if he holds onto something and the shaking subsides after a couple of minutes.  However at other time the spell progresses to syncope associated with falling and ongoing limb jerking.  No associated urinary incontinence which had been an issue some years ago.  He was hypotensive at 80/64.  He had felt sluggish since discharge in late January.  Claims alcohol abstinence but poor appetite since discharge.  The anorexia has been an issue for at least 6 months..  He developed recurrent seizures and his doctor told him to go to the emergency department.  Eventually made his way to the emergency department. He has been compliant with taking Protonix twice daily.  Still drinking at least 40 ounces of beer slowly over the course of the day.  This is down from multiple 40s a few weeks back. Patient describes his GI problem as loss of appetite.  Its been going on for 6 months.  He says that he will feel hungry and prepare a meal but when he looks at it and tries to taste it he often gets nauseated and want to eat.  He is able to tolerate bland foods at times such as applesauce, oatmeal, a variety of liquids including his beer.  There is not a lot of emesis going on.  Says he is having daily brown stools.  No abdominal pain.  Pt estimates he has lost 40 pounds in the last 6 months.  Hgb 9.4 >> 8.5.   MCV in the 90s.  Platelets in 300s.  INR 1.3. Glucose as high as 404, today it measured 40.  Sodium normal. AKI with GFR 36, today this is greater than 60.   ETOH level was never rechecked.  Head CT with stable atrophy, no acute abnormalities.  Neurology has performed an EEG, no seizure or epileptiform discharges noted on the study which was labeled as normal.   Past Medical History:  Diagnosis Date  . Acute renal insufficiency 12/08/2012  . AKI (acute kidney injury) ([place])   . ALCOHOL ABUSE, HX OF 11/10/2007  . Anoxic encephalopathy ([place])   . Anxiety   . Bleeding ulcer 2014  . Blood transfusion 2014   "related to bleeding ulcer"  . Cardiac arrest ([place]) 12/07/2012   Anoxic encephalopathy  . GERD (gastroesophageal reflux disease)   . High cholesterol   . Hypertension   . OSA on CPAP 12/07/2012  . Seizure-like activity ([place])   . Type II diabetes mellitus ([place])   . Venous insufficiency 12/13/2011    Past Surgical History:  Procedure Laterality Date  . BIOPSY  11/01/2020   Procedure: BIOPSY;  Surgeon: Irene Shipper, MD;  Location: [facility];  Service: Endoscopy;;  . CARDIOVASCULAR STRESS TEST  03/16/13   Very Poor Exercise Tolerance; NON DIAGNOSTIC TEST  . CATARACT EXTRACTION W/ INTRAOCULAR LENS IMPLANT Left 03/07/2014   [place] @ [facility]  . ESOPHAGOGASTRODUODENOSCOPY Left 11/26/2012   Procedure: ESOPHAGOGASTRODUODENOSCOPY (EGD);  Surgeon: Wonda Horner, MD;  Location: [facility] ENDOSCOPY;  Service: Endoscopy;  Laterality: Left;  . ESOPHAGOGASTRODUODENOSCOPY N/A 10/10/2014   Procedure: ESOPHAGOGASTRODUODENOSCOPY (EGD);  Surgeon: Ladene Artist, MD;  Location[facility]COPY;  Service: Endoscopy;  Laterality: N/A;  . ESOPHAGOGASTRODUODENOSCOPY (EGD) WITH PROPOFOL N/A 11/01/2020   Procedure: ESOPHAGOGASTRODUODENOSCOPY (EGD) WITH PROPOFOL;  Surgeon: Irene Shipper, MD;  Location: [facility] ENDOSCOPY;  Service: Endoscopy;  Laterality: N/A;  . KNEE ARTHROSCOPY Left ~ 1999    Prior to Admission  medications   Medication Sig Start Date End Date Taking? Authorizing Provider  aspirin EC 81 MG tablet Take 1 tablet (81 mg total) by mouth at bedtime. 06/24/19  Yes Zenia Resides, MD  atorvastatin (LIPITOR) 80 MG tablet TAKE 1 TABLET (80 MG TOTAL) BY MOUTH AT BEDTIME. 07/27/20  Yes Cresenzo, Christy Sartorius, MD  FARXIGA 10 MG TABS tablet Take 1 tablet (10 mg total) by mouth daily. 07/25/20  Yes Shelly Coss, MD  folic acid (FOLVITE) 1 MG tablet Take 1 tablet (1 mg total) by mouth daily. Patient taking differently: Take 1 mg by mouth at bedtime. 07/04/20  Yes [name], MD  gabapentin (NEURONTIN) 300 MG capsule Take 3 capsules (900 mg total) by mouth 3 (three) times daily as needed (Neuropathy). 11/03/20  Yes Welborn, Ryan, DO  levETIRAcetam (KEPPRA) 1000  MG tablet Take 1 tablet (1,000 mg total) by mouth 2 (two) times daily. 11/16/20  Yes Meccariello, Bernita Raisin, DO  lidocaine-prilocaine (EMLA) cream Apply 1 application topically as needed (pain).   Yes [provider]  losartan (COZAAR) 25 MG tablet Take 25 mg by mouth daily. 10/10/20  Yes [provider]  metFORMIN (GLUCOPHAGE-XR) 500 MG 24 hr tablet Take 1 tablet (500 mg total) by mouth daily with breakfast. 07/25/20  Yes Adhikari, Tamsen Meek, MD  metoprolol succinate (TOPROL XL) 50 MG 24 hr tablet Take 1 tablet (50 mg total) by mouth daily. 08/08/20 11/06/20 Yes [name], MD  mirtazapine (REMERON) 30 MG tablet Take 1 tablet (30 mg total) by mouth at bedtime. 11/16/20  Yes Meccariello, Bernita Raisin, DO  naltrexone (DEPADE) 50 MG tablet Take 1 tablet (50 mg total) by mouth daily. 11/03/20  Yes Welborn, Ryan, DO  pantoprazole (PROTONIX) 40 MG tablet Take 1 tablet twice a day for 28 days followed by 1 tablet per day ongoing. Patient taking differently: Take 40 mg by mouth 2 (two) times daily. Take 1 tablet twice a day for 28 days followed by 1 tablet per day ongoing. 11/03/20  Yes Welborn, Ryan, DO  potassium chloride SA (KLOR-CON) 20 MEQ  tablet Take 20 mEq by mouth daily. 11/15/20  Yes [provider]  sildenafil (VIAGRA) 100 MG tablet TAKE 1 TABLET BY MOUTH ONCE DAILY AS NEEDED FOR ERECTILE DYSFUNCTION Patient taking differently: Take 100 mg by mouth as needed for erectile dysfunction. 05/29/20  Yes [name], MD  thiamine (VITAMIN B-1) 100 MG tablet Take 1 tablet (100 mg total) by mouth daily. 05/27/16  Yes Gottschalk, Leatrice Jewels M, DO  Continuous Blood Gluc Sensor (DEXCOM G6 SENSOR) MISC 1 Units by Does not apply route as directed. 09/26/20   [name], MD  Continuous Blood Gluc Transmit (DEXCOM G6 TRANSMITTER) MISC Use every 90 days 10/05/20   [name], MD  glucose blood (TRUE METRIX BLOOD GLUCOSE TEST) test strip Please use to check blood sugar up to three times daily.  07/03/20   [name], MD    Scheduled Meds: . aspirin EC  81 mg Oral Daily  . atorvastatin  80 mg Oral QHS  . enoxaparin (LOVENOX) injection  40 mg Subcutaneous Q24H  . folic acid  1 mg Oral Daily  . insulin aspart  0-15 Units Subcutaneous TID WC  . levETIRAcetam  500 mg Oral BID  . mirtazapine  30 mg Oral QHS  . naltrexone  50 mg Oral Daily  . pantoprazole  40 mg Oral BID  . potassium chloride  40 mEq Oral BID  . thiamine  100 mg Oral Daily   Infusions: . sodium chloride 1,000 mL (11/19/20 1825)   PRN Meds: acetaminophen **OR** acetaminophen, gabapentin, polyethylene glycol   Allergies as of 11/17/2020 - Review Complete 11/17/2020  Allergen Reaction Noted  . Lisinopril Other (See Comments) 11/30/2009  . Drug ingredient [spironolactone]  03/27/2020    Family History  Problem Relation Age of Onset  . Other Mother        Unsure of medical history.  . Diabetes Mellitus II Father     Social History   Socioeconomic History  . Marital status: Married    Spouse name: Not on file  . Number of children: 0  . Years of education: Bachelors  . Highest education level: Not on file  Occupational History  .  Occupation: maintenance tech  Tobacco Use  . Smoking status: Never Smoker  .  Smokeless tobacco: Current User    Types: Snuff  Vaping Use  . Vaping Use: Never used  Substance and Sexual Activity  . Alcohol use: Yes    Alcohol/week: 6.0 standard drinks    Types: 6 Cans of beer per week  . Drug use: No  . Sexual activity: Yes  Other Topics Concern  . Not on file  Social History Narrative   Lives at home with wife. He has four stepchildren.    Right-handed.   No caffeine use.   Social Determinants of Health   Financial Resource Strain: Not on file  Food Insecurity: Not on file  Transportation Needs: Not on file  Physical Activity: Not on file  Stress: Not on file  Social Connections: Not on file  Intimate Partner Violence: Not on file    REVIEW OF SYSTEMS: Constitutional: Weakness, improved since hospitalization. ENT:  No nose bleeds Pulm: No shortness of breath.  No cough. CV:  No palpitations, no LE edema.  No angina GU:  No hematuria, no frequency GI: See HPI.  No abdominal pain.  No altered bowel habits. Heme: No unusual or excessive bleeding or bruising Transfusions: Received PRBCs in 2014 Neuro:  No headaches, no peripheral tingling or numbness Derm:  No itching, no rash or sores.  Endocrine:  No sweats or chills.  No polyuria or dysuria Immunization: Reviewed.  Up-to-date on multiple immunizations.  Received Materna Covid 19 VAX in March and April 2021. Travel:  None beyond local counties in last few months.    PHYSICAL EXAM: Vital signs in last 24 hours: Vitals:   11/20/20 0424 11/20/20 0900  BP: (!) 148/93 (!) 140/108  Pulse: 83 95  Resp: 20 18  Temp: 98.2 F (36.8 C) 98.1 F (36.7 C)  SpO2: 100% 100%   Wt Readings from Last 3 Encounters:  11/20/20 83.9 kg  11/17/20 106.1 kg  11/16/20 105.7 kg    General: Somewhat unwell but comfortable, pleasant and alert. Head: No facial asymmetry.  Cushingoid facies. Eyes: No scleral icterus or conjunctival  pallor.  EOMI Ears: Not hard of hearing Nose: No congestion or discharge Mouth: Moist, pink, clear oropharynx.  Tongue midline fair dentition Neck: No JVD, masses, thyromegaly Lungs: Clear bilaterally without labored breathing. Heart: RRR.  No MRG. Abdomen: Soft.  Not tender.  No HSM, masses, bruits, hernias.  Active bowel sounds..   Rectal: Deferred Musc/Skeltl: No gross joint erythema, swelling or contracture deformities. Extremities: No CCE. Neurologic: Alert.  Oriented x3.  Fair historian.  Moves all 4 limbs.  No tremors.  No asterixis Skin: No sores, no rashes Nodes: No cervical adenopathy Psych: Operative, calm, pleasant.  Intake/Output from previous day: 02/13 0701 - 02/14 0700 In: 100 [P.O.:100] Out: 650 [Urine:650] Intake/Output this shift: No intake/output data recorded.  LAB RESULTS: Recent Labs    11/18/20 0545 11/19/20 0424 11/20/20 0246  WBC 7.2 6.7 7.5  HGB 9.4* 9.1* 8.5*  HCT 28.4* 26.4* 25.9*  PLT 409* 386 339   BMET Lab Results  Component Value Date   NA 139 11/20/2020   NA 136 11/19/2020   NA 134 (L) 11/18/2020   K 2.7 (LL) 11/20/2020   K 3.9 11/19/2020   K 3.2 (L) 11/18/2020   CL 107 11/20/2020   CL 103 11/19/2020   CL 100 11/18/2020   CO2 24 11/20/2020   CO2 24 11/19/2020   CO2 25 11/18/2020   GLUCOSE 40 (LL) 11/20/2020   GLUCOSE 258 (H) 11/19/2020   GLUCOSE 404 (H) 11/18/2020  BUN 9 11/20/2020   BUN 10 11/19/2020   BUN 7 11/18/2020   CREATININE 1.17 11/20/2020   CREATININE 1.55 (H) 11/19/2020   CREATININE 1.81 (H) 11/18/2020   CALCIUM 7.3 (L) 11/20/2020   CALCIUM 7.4 (L) 11/19/2020   CALCIUM 7.2 (L) 11/18/2020   LFT No results for input(s): PROT, ALBUMIN, AST, ALT, ALKPHOS, BILITOT, BILIDIR, IBILI in the last 72 hours. PT/INR Lab Results  Component Value Date   INR 1.3 (H) 11/17/2020   INR 2.0 (H) 10/31/2020   INR 1.5 (H) 07/22/2020   Hepatitis Panel No results for input(s): HEPBSAG, HCVAB, HEPAIGM, HEPBIGM in the last 72  hours. C-Diff No components found for: CDIFF Lipase     Component Value Date/Time   LIPASE 17 11/06/2015 0655    Drugs of Abuse     Component Value Date/Time   LABOPIA NONE DETECTED 06/28/2020 1811   COCAINSCRNUR NONE DETECTED 06/28/2020 1811   LABBENZ NONE DETECTED 06/28/2020 1811   AMPHETMU NONE DETECTED 06/28/2020 1811   THCU NONE DETECTED 06/28/2020 1811   LABBARB NONE DETECTED 06/28/2020 1811     RADIOLOGY STUDIES: CT HEAD WO CONTRAST  Result Date: 11/18/2020 CLINICAL DATA:  Syncope, recurrent. EXAM: CT HEAD WITHOUT CONTRAST TECHNIQUE: Contiguous axial images were obtained from the base of the skull through the vertex without intravenous contrast. COMPARISON:  Prior head CT examinations 10/31/2020 and earlier. Brain MRI 08/27/2016. FINDINGS: Brain: Moderate cerebral and cerebellar atrophy. There is no acute intracranial hemorrhage. No demarcated cortical infarct. No extra-axial fluid collection. No evidence of intracranial mass. No midline shift. Vascular: No hyperdense vessel.  Atherosclerotic calcifications Skull: Normal. Negative for fracture or focal lesion. Sinuses/Orbits: Visualized orbits show no acute finding. No significant paranasal sinus disease at the imaged levels. Other: Right mastoid effusion. IMPRESSION: No evidence of acute intracranial abnormality. Moderate cerebral and cerebellar atrophy, stable as compared to the head CT of 10/31/2020. Right mastoid effusion. Electronically Signed   By: Kellie Simmering DO   On: 11/18/2020 13:59   EEG adult  Result Date: 11/20/2020 Lora Havens, MD     11/20/2020 10:43 AM Patient Name: Jeffrey [name] MRN: [id number] Epilepsy Attending: Lora Havens Referring Physician/Provider: Dr Carollee Leitz Date: 11/20/2020 Duration: 32.11 mins Patient history: 55 year old male with history of seizures and recurrent episodes of passing out.  EEG to evaluate for seizures. Level of alertness: Awake, drowsy AEDs during EEG study: Keppra  Technical aspects: This EEG study was done with scalp electrodes positioned according to the 10-20 International system of electrode placement. Electrical activity was acquired at a sampling rate of 500Hz  and reviewed with a high frequency filter of 70Hz  and a low frequency filter of 1Hz . EEG data were recorded continuously and digitally stored. Description: The posterior dominant rhythm consists of 8 Hz activity of moderate voltage (25-35 uV) seen predominantly in posterior head regions, symmetric and reactive to eye opening and eye closing. Drowsiness was characterized by attenuation of the posterior background rhythm. Physiologic photic driving was not seen during photic stimulation.  Hyperventilation was not performed.   IMPRESSION: This study is within normal limits. No seizures or epileptiform discharges were seen throughout the recording. Lora Havens   ECHOCARDIOGRAM COMPLETE  Result Date: 11/18/2020 IMPRESSIONS  1. Left ventricular ejection fraction, by estimation, is 70 to 75%. The left ventricle has hyperdynamic function. The left ventricle has no regional wall motion abnormalities. There is mild concentric left ventricular hypertrophy. Left ventricular diastolic parameters are consistent with Grade I diastolic dysfunction (impaired relaxation).  2. Right ventricular systolic function is normal. The right ventricular size is normal. There is normal pulmonary artery systolic pressure.  3. The mitral valve is grossly normal. Trivial mitral valve regurgitation.  4. The aortic valve is tricuspid. There is mild thickening of the aortic valve. Aortic valve regurgitation is not visualized.  5. Aortic dilatation noted. There is mild dilatation of the ascending aorta, measuring 39 mm. Comparison(s): No significant change from prior study.  Electronically signed by Gwyndolyn Kaufman MD Signature Date/Time: 11/18/2020/2:25:37 PM    Final      IMPRESSION:   *   Anorexia, adverse reaction to prepared  meals. Known recurrent severe esophagitis and more recently severe duodenitis on EGD of late January 2022.  Compliant with Protonix bid since recent discharge.  *   Chronic alcoholism.  Naltrexone added to try to curb his desire to drink.  It is unclear that he is taking this.   *    Pancreatic calcifications consistent with chronic pancreatitis.  *    Multiple seizure-like activity with normal EEG.  Recent discontinuation by inpatient teaching service of Depakote replaced by Keppra but neurologist wanted to trial Lamictal in place of Depakote.  Current inpatient neurology is deferring antiepileptic medication adjustments to Dr. [name] his outpt nerologist.    *   Hypokalemia.  Potassium today 2.7.  *    AKI, resolved.  *    DM 2.  Previously on home insulin.  Glucose as high as 404 during current admission.    PLAN:     *   ? D/C Naltrexone (commonly linked to nausea, lesser linkage to anorexia) unless there is some overriding reason for this med.  From speaking w pt it is not clear he has been taking this.     Azucena Freed  11/20/2020, 11:25 AM Phone [phone]    Attending Physician Note   I have taken a history, examined the patient and reviewed the chart. I agree with the Advanced Practitioner's note, impression and recommendations.  Anorexia, little appetite. Could be medication related, suboptimal DM control. Recent GI evaluation with EGD 3 weeks ago showed severe esophagitis and duodenal ulcerations. CTA 3 weeks ago did not show mesenteric vascular disease. Chronic pancreatitis was noted.   Continue pantoprazole 40 mg po bid  Add Zofran 4 mg tid ac Optimize control of DM Discontinue naltrexone, if possible, defer to primary serivce  Consider reducing gabapentin dose, if possible, defer to primary service No plans to repeat EGD or CT at this time GI signing off  [name], MD FACG [phone]

## 2020-11-20 NOTE — Progress Notes (Signed)
Neurology Progress Note  S: No syncope or seizure like activity.   O: Current vital signs: BP (!) 140/108 (BP Location: Right Arm)   Pulse 95   Temp 98.1 F (36.7 C) (Axillary)   Resp 18   Ht 5\' 10"  (1.778 m)   Wt 83.9 kg   SpO2 100%   BMI 26.54 kg/m  Vital signs in last 24 hours: Temp:  [97.7 F (36.5 C)-98.2 F (36.8 C)] 98.1 F (36.7 C) (02/14 0900) Pulse Rate:  [83-98] 95 (02/14 0900) Resp:  [18-20] 18 (02/14 0900) BP: (118-155)/(76-108) 140/108 (02/14 0900) SpO2:  [100 %] 100 % (02/14 0900) Weight:  [83.9 kg] 83.9 kg (02/14 0500)  GENERAL: Awake, alert in NAD HEENT: Normocephalic and atraumatic, dry mm LUNGS: Normal respiratory effort.  CV: RRR on tele.  ABDOMEN: Soft, nontender Ext: warm Psych: flat affect   NEURO:  Mental Status: AA&Ox3  Speech/Language: speech is without dysarthria. Comprehension intact.  Cranial Nerves:  II: PERRL. Visual fields full.  III, IV, VI: EOMI. Eyelids elevate symmetrically.  V: Sensation is intact to light touch and symmetrical to face.  VII: Smile is symmetrical. Able to puff cheeks and raise eyebrows.  VIII: hearing intact to voice. IX, X: Palate elevates symmetrically. Phonation is normal.  NF:AOZHYQMV shrug 5/5. XII: tongue is midline without fasciculations. Motor: 5/5 strength to all muscle groups tested.  Tone: is normal and bulk is normal Sensation- Intact to light touch bilaterally. Extinction without to DSS.    Coordination: No drift.  Gait- deferred  Medications  Current Facility-Administered Medications:  .  [COMPLETED] sodium chloride 0.9 % bolus 1,000 mL, 1,000 mL, Intravenous, Once, Stopped at 11/17/20 2234 **FOLLOWED BY** [COMPLETED] sodium chloride 0.9 % bolus 1,000 mL, 1,000 mL, Intravenous, Once, Stopped at 11/17/20 2234 **FOLLOWED BY** 0.9 %  sodium chloride infusion, 1,000 mL, Intravenous, Continuous, Matilde Haymaker, MD, Last Rate: 125 mL/hr at 11/19/20 1825, 1,000 mL at 11/19/20 1825 .  acetaminophen  (TYLENOL) tablet 650 mg, 650 mg, Oral, Q6H PRN **OR** acetaminophen (TYLENOL) suppository 650 mg, 650 mg, Rectal, Q6H PRN, Matilde Haymaker, MD .  aspirin EC tablet 81 mg, 81 mg, Oral, Daily, Matilde Haymaker, MD, 81 mg at 11/20/20 0858 .  atorvastatin (LIPITOR) tablet 80 mg, 80 mg, Oral, QHS, Matilde Haymaker, MD, 80 mg at 11/19/20 2115 .  enoxaparin (LOVENOX) injection 40 mg, 40 mg, Subcutaneous, Q24H, Matilde Haymaker, MD, 40 mg at 78/46/96 2952 .  folic acid (FOLVITE) tablet 1 mg, 1 mg, Oral, Daily, Matilde Haymaker, MD, 1 mg at 11/20/20 0857 .  gabapentin (NEURONTIN) capsule 900 mg, 900 mg, Oral, TID PRN, Matilde Haymaker, MD .  insulin aspart (novoLOG) injection 0-9 Units, 0-9 Units, Subcutaneous, TID WC, Matilde Haymaker, MD .  levETIRAcetam (KEPPRA) tablet 500 mg, 500 mg, Oral, BID, Matilde Haymaker, MD .  mirtazapine (REMERON) tablet 30 mg, 30 mg, Oral, QHS, Matilde Haymaker, MD, 30 mg at 11/19/20 2115 .  naltrexone (DEPADE) tablet 50 mg, 50 mg, Oral, Daily, Matilde Haymaker, MD, 50 mg at 11/20/20 0856 .  pantoprazole (PROTONIX) EC tablet 40 mg, 40 mg, Oral, BID, Matilde Haymaker, MD, 40 mg at 11/20/20 0857 .  polyethylene glycol (MIRALAX / GLYCOLAX) packet 17 g, 17 g, Oral, Daily PRN, Matilde Haymaker, MD .  potassium chloride SA (KLOR-CON) CR tablet 40 mEq, 40 mEq, Oral, BID, Brimage, Vondra, DO, 40 mEq at 11/20/20 0558 .  thiamine tablet 100 mg, 100 mg, Oral, Daily, Matilde Haymaker, MD, 100 mg at 11/20/20 0856  Labs pertinent:  K 2.7, Mg 1.4  Imaging MD has reviewed images in epic and the results pertinent to this consultation are:  CT Head No evidence of acute intracranial abnormality. Moderate cerebral and cerebellar atrophy, stable as compared to the head CT of 10/31/2020.  MRI Brain  1. No acute intracranial abnormality or mass. 2. Mild cerebral and cerebellar atrophy.  EEG This study is within normal limits. No seizures or epileptiform discharges were seen throughout the recording.   Assessment: 55 yo male who  presented to ED on 11/18/2020 with episodes of weakness and shaking while walking. He does have previous history of seizures. Impression was that these spells were epileptic in nature. Seizure like activity was thought to be due to syncope as his BP was low on presentation. Patient stated his spells increased after his out patient medicine was changed from [place] to [place]. His out patient neurologist is Dr. [name]. His EEG is within normal limits and no seizures or epileptiform discharges were seen.   Recommendations/Plan: -EEG was negative -have patient f/up with Dr. [name] after discharge.  -Keppra level is pending. -Continue Depakote ER 500mg  po q12 hours and further med adjustment per Dr. [name] unless further seizure activity is noted here.   Neurology will be available for questions.   Pt seen by Clance Boll, MSN, APN-BC/Nurse Practitioner/Neuro and later by MD. Note and plan to be edited as needed by MD.  Pager: [phone]   Attestation:  I saw this patient with the APP on 11/20/20, obtained pertinent aspects of the history, and performed relevant physical and neurological examination as documented. Also, I reviewed the available laboratory data and neuroimages, and other relevant tests/notes/procedures.  My examination findings include peripheral neuropathy with distal sensory loss and absent ankle jerks. Otherwise, unremarkable exam.  CT shows diffuse atrophy, out of proportion to age.  Impression: 1. History of TBI and alcoholism that can predispose to seizures. 2. Orthostatic dizziness/syncope with suspected convulsive syncope or provoked seizures partially on this basis. 3. Peripheral neuropathy; cannot rule out autonomic features  Recommendations: - continue ongoing antiseizure medication. - outpatient follow up after discharge. - no additional workup or alterations in medication doses required at this time. - will be available prn, should there be additional  questions.   Thank you.  Perfecto Kingdom, MD

## 2020-11-21 LAB — GLUCOSE, CAPILLARY
Glucose-Capillary: 210 mg/dL — ABNORMAL HIGH (ref 70–99)
Glucose-Capillary: 259 mg/dL — ABNORMAL HIGH (ref 70–99)
Glucose-Capillary: 371 mg/dL — ABNORMAL HIGH (ref 70–99)
Glucose-Capillary: 313 mg/dL — ABNORMAL HIGH (ref 70–99)

## 2020-11-21 LAB — CBC
HCT: 30.9 % — ABNORMAL LOW (ref 39.0–52.0)
Hemoglobin: 9.9 g/dL — ABNORMAL LOW (ref 13.0–17.0)
MCH: 29.7 pg (ref 26.0–34.0)
MCHC: 32 g/dL (ref 30.0–36.0)
MCV: 92.8 fL (ref 80.0–100.0)
Platelets: 346 10*3/uL (ref 150–400)
RBC: 3.33 MIL/uL — ABNORMAL LOW (ref 4.22–5.81)
RDW: 16.8 % — ABNORMAL HIGH (ref 11.5–15.5)
WBC: 8.4 10*3/uL (ref 4.0–10.5)
nRBC: 0 % (ref 0.0–0.2)

## 2020-11-21 LAB — VITAMIN B12: Vitamin B-12: 753 pg/mL (ref 180–914)

## 2020-11-21 LAB — BASIC METABOLIC PANEL
Anion gap: 8 (ref 5–15)
BUN: 7 mg/dL (ref 6–20)
CO2: 21 mmol/L — ABNORMAL LOW (ref 22–32)
Calcium: 7.6 mg/dL — ABNORMAL LOW (ref 8.9–10.3)
Chloride: 107 mmol/L (ref 98–111)
Creatinine, Ser: 1.06 mg/dL (ref 0.61–1.24)
GFR, Estimated: >60 mL/min (ref >60)
Glucose, Bld: 197 mg/dL — ABNORMAL HIGH (ref 70–99)
Potassium: 4.8 mmol/L (ref 3.5–5.1)
Sodium: 136 mmol/L (ref 135–145)

## 2020-11-21 MED ORDER — ADULT MULTIVITAMIN W/MINERALS CH
1.0000 | ORAL_TABLET | Freq: Every day | ORAL | Status: DC
  Administered 2020-11-21 – 2020-11-22 (×2): 1 via ORAL
  Filled 2020-11-21 (×2): qty 1

## 2020-11-21 MED ORDER — ENSURE ENLIVE PO LIQD
237.0000 mL | Freq: Two times a day (BID) | ORAL | Status: DC
  Administered 2020-11-21 – 2020-11-22 (×3): 237 mL via ORAL

## 2020-11-21 MED ORDER — PROSIGHT PO TABS: 1.0000 | ORAL_TABLET | Freq: Every day | ORAL | Status: DC

## 2020-11-21 MED ORDER — LEVETIRACETAM 500 MG PO TABS
1000.0000 mg | ORAL_TABLET | Freq: Two times a day (BID) | ORAL | Status: DC
  Administered 2020-11-21 – 2020-11-22 (×2): 1000 mg via ORAL
  Filled 2020-11-21 (×2): qty 2

## 2020-11-21 NOTE — Progress Notes (Signed)
Physical Therapy Treatment Patient Details Name: Jeffrey [name] MRN: [id number] DOB: Oct 06, 1966 Today's Date: 11/21/2020    History of Present Illness 55 y.o. male presenting with multiple episodes of weakness and shaking while walking. High suspicion for hypotension due to poor p.o. intake. PMH is significant for cirrhosis, alcohol use disorder, T2DM, anoxic brain injury s/p cardiac arrest, HTN, HLD, OSA, CKD, seizure disorder.    PT Comments    Patient progressing well towards PT goals. Improved ambulation distance with supervision for safety and no need for UE support today. No symptoms of dizziness throughout activity. Pt appears irritated and frustrated about being in the hospital, "I am not doing anything here." Encouraged walking with staff however pt reports they do not have time. BP more stable today-Sitting BP 124/102, standing BP 111/86, sitting BP post walk 142/116. Will plan for stair training and higher level balance activities next session as tolerated. Will follow.   Follow Up Recommendations  No PT follow up     Equipment Recommendations  None recommended by PT    Recommendations for Other Services       Precautions / Restrictions Precautions Precautions: Fall Precaution Comments: orthostatic BP Restrictions Weight Bearing Restrictions: No    Mobility  Bed Mobility               General bed mobility comments: Sitting in chair upon PT arrival.    Transfers Overall transfer level: Modified independent Equipment used: None             General transfer comment: Stood from chair x2, Pt with complete LOB onto chair due to it not locking appropriately.  Ambulation/Gait Ambulation/Gait assistance: Supervision Gait Distance (Feet): 300 Feet Assistive device: None Gait Pattern/deviations: Step-through pattern;Decreased stride length Gait velocity: reduced   General Gait Details: Slow, mostly steady gait with no UE support needed today. no  dizziness. reports strength has improved.   Stairs             Wheelchair Mobility    Modified Rankin (Stroke Patients Only)       Balance Overall balance assessment: Mild deficits observed, not formally tested                                          Cognition Arousal/Alertness: Awake/alert Behavior During Therapy: Flat affect Overall Cognitive Status: No family/caregiver present to determine baseline cognitive functioning                                 General Comments: appears [facility] for basic mobility tasks, however not the best historian or reliable at description of symptoms/course of treatment. "I am fine." Easily irritated/annoyed that he is still in the hospital, "I am doing nothing here, so when you ask me how i feel, i dont know."      Exercises      General Comments General comments (skin integrity, edema, etc.): Sitting BP 124/102, standing BP 111/86, sitting BP post walk 142/116. Asymptomatic throughout.      Pertinent Vitals/Pain Pain Assessment: Faces Faces Pain Scale: Hurts little more Pain Location: bil feet due to swelling Pain Descriptors / Indicators: Tightness Pain Intervention(s): Monitored during session;Repositioned    Home Living                      Prior Function  PT Goals (current goals can now be found in the care plan section) Progress towards PT goals: Progressing toward goals    Frequency    Min 3X/week      PT Plan Current plan remains appropriate    Co-evaluation              AM-PAC PT "6 Clicks" Mobility   Outcome Measure  Help needed turning from your back to your side while in a flat bed without using bedrails?: None Help needed moving from lying on your back to sitting on the side of a flat bed without using bedrails?: None Help needed moving to and from a bed to a chair (including a wheelchair)?: None Help needed standing up from a chair using your  arms (e.g., wheelchair or bedside chair)?: None Help needed to walk in hospital room?: A Little Help needed climbing 3-5 steps with a railing? : A Little 6 Click Score: 22    End of Session   Activity Tolerance: Patient tolerated treatment well Patient left: in chair;with call bell/phone within reach;with chair alarm set Nurse Communication: Mobility status PT Visit Diagnosis: Other abnormalities of gait and mobility (R26.89)     Time: 1100-1118 PT Time Calculation (min) (ACUTE ONLY): 18 min  Charges:  $Gait Training: 8-22 mins                     Marisa Severin, PT, DPT Acute Rehabilitation Services Pager [phone] Office [phone]       [name] 11/21/2020, 12:45 PM

## 2020-11-21 NOTE — Progress Notes (Signed)
Inpatient Diabetes Program Recommendations  AACE/ADA: New Consensus Statement on Inpatient Glycemic Control (2015)  Target Ranges:  Prepandial:   less than 140 mg/dL      Peak postprandial:   less than 180 mg/dL (1-2 hours)      Critically ill patients:  140 - 180 mg/dL   Results for Jeffrey [name], Jeffrey [name] (MRN [id number]) as of 11/21/2020 13:50  Ref. Range 11/20/2020 06:09 11/20/2020 12:39 11/20/2020 16:47 11/20/2020 20:34  Glucose-Capillary Latest Ref Range: 70 - 99 mg/dL 139 (H) 212 (H)  3 units NOVOLOG  282 (H)  5 units NOVOLOG  249 (H)   Results for Jeffrey [name], Jeffrey [name] (MRN [id number]) as of 11/21/2020 13:50  Ref. Range 11/21/2020 06:29 11/21/2020 12:11  Glucose-Capillary Latest Ref Range: 70 - 99 mg/dL 210 (H)  3 units NOVOLOG  259 (H)     Home DM Meds: Farxiga 10 mg Daily       Metformin 500 mg Daily  Current Orders: Novolog Sensitive Correction Scale/ SSI (0-9 units) TID AC    MD- Note CBGs >200 yesterday afternoon and so far today.  Poor PO intake noted on documentation flowsheet.  May consider increasing Novolog SSi to the 0-15 unit Moderate scale  If PO intake stays poor, may consider increasing the frequency of the Novolog SSI to Q4 hours     --Will follow patient during hospitalization--  [name] RN, MSN, CDE Diabetes Coordinator Inpatient Glycemic Control Team Team Pager: [phone] (8a-5p)

## 2020-11-21 NOTE — Progress Notes (Signed)
Family Medicine Teaching Service Daily Progress Note Intern Pager: [phone]  Patient name: Jeffrey [name] Medical record number: [id number] Date of birth: April 16, 1966 Age: 55 y.o. Gender: male  Primary Care Provider: [name], MD Consultants: None Code Status: Full  Pt Overview and Major Events to Date:  02/11- Admitted to hospital  Assessment and Plan: KANISHK STROEBEL is a 55 y.o. male presenting with multiple episodes of weakness and shaking while walking. PMH is significant for cirrhosis, alcohol use disorder, T2DM, anoxic brain injury s/p cardiac arrest, HTN, HLD, OSA, CKD, seizure disorder.  Syncopal Episodes Echo shows an EF of 70-75% with a hyperdynamic left ventricle. History of seizure disorder. Likely 2/2 to orthostatic hypotension, potentially due to neuropathy from DM or vitamin deficiency. -Follow up B12 level  -PT note 2/14: Continues to have hypotension initially with standing but asymptomatic. Sitting BP 150/106, HR 104 bpm, standing BP 115/80, HR 106 bpm, sitting BP post activity 158/118. -EEG 2/14 showed no seizures or epileptiform discharges -Keppra level pending  -Continue to hold antihypertensives -PT/OT recs inpatient therapy while admitted, no need for outpatient PT, anticipate mobility and strength will improve quickly with increased activity; ambulate patient as tolerated, increased time between positional changes -Discontinue IVF, encourage PO intake   Seizure Disorder  Normal EEG 2/14 Previously well-controlled on Depakote ER 500mg  BID, but switched to Keppra two weeks ago and has increased syncopal episodes.  -Per conversation with Dr. Charlsie Merles, continue patient on 1000mg  Keppra BID until further med adjustment as an outpatient with primary neurologist Dr. [name]  Anorexia  Weight Loss Patient admits to little PO intake 2/2 no appetite over last 6 months. This is likely due to recent diagnosis of esophagitis and severe duodenitis, but there is slight  concern for underlying etiology. Per chart review, he was 121kg in August 2021, now 84.1kg.  -GI recs: discontinue naltrexone and consider reducing gabapentin dose, could be contributing to decreased appetite; better control blood sugars; add Zofran 4mg  TID ac; no plans to repeat EGD or CT -Add Ensure supplements twice daily   Hx of HTN with Hypotension on Admission Blood pressures stable. Orthostatic vitals as above. Receiving IVF since admission. -Continue to hold home medications due to orthostasis    AKI on CKD, improving Baseline creatinine 1.0. Admission Cr 2.1>1.06. -Encourage PO intake -Daily BMP  Recent GI Bleed 2/2 Severe Erosive Esophagitis No current signs of bleeding. Admission Hgb 11.6>8.4. Most likely secondary to hemoconcentration on presentation to the hospital.  -GI recs: no plans to repeat EGD or CT at this time  -Continue Protonix  -Daily CBC  Hx of Cirrhosis  Alcohol Use Disorder Per GI note, unsure if patient is taking naltrexone and could be linked to nausea. Patient has decreased drinking since admission in January. -Continue daily thiamine, folic acid and multivitamins -D/c naltrexone  T2DM A1c 1/26: 6.9%. Home Metformin and Farxiga. Otherwise, blood glucose 197-259 over the past 24 hours. -Sensitive SSI due to one episodes of hypoglycemia  -CBG monitoring  Depression -Continue home Mirtazapine 30mg  qhs  OSA on CPAP -CPAP at night  HLD  S/p cardiac arrest -Continue home ASA 81mg  qd, atorvastatin 80mg  qd  FEN/GI: Regular diet Prophylaxis: Lovenox 40mg  subcut  Status is: Inpatient, FPTS, attending Dr. Thompson Grayer  Subjective:  Patient examined at bedside, sitting up with breakfast. Denies feeling dizzy and reports slow positional changes help decrease sensation. Denies chest pain, SOB, abdominal pain, diarrhea. Endorses poor appetite. States he feels like he is capable of caring for himself at  home.   Objective: Temp:  [97.4 F (36.3 C)-98.4 F  (36.9 C)] 98.1 F (36.7 C) (02/15 0410) Pulse Rate:  [82-100] 82 (02/15 0410) Resp:  [16-18] 18 (02/15 0410) BP: (140-157)/(98-108) 141/98 (02/15 0410) SpO2:  [100 %] 100 % (02/15 0410) Weight:  [84.1 kg] 84.1 kg (02/15 0500)  Physical Exam:   General: 55 y.o. male in NAD, sitting up, pleasant. Cardio: S1S2 noted, no murmurs heard. Peripheral pulses intact.  Lungs: Clear lung fields, no wheezing or rales. No increased work of breathing on room air. Abdomen: Soft, nontender, nondistended Skin: Warm and dry Extremities: 1+ pitting edema to mid shin, patient reports this is his normal   Laboratory: Recent Labs  Lab 11/19/20 0424 11/20/20 0246 11/21/20 0231  WBC 6.7 7.5 8.4  HGB 9.1* 8.5* 9.9*  HCT 26.4* 25.9* 30.9*  PLT 386 339 346   Recent Labs  Lab 11/20/20 0246 11/20/20 1111 11/21/20 0231  NA 139 136 136  K 2.7* 3.9 4.8  CL 107 104 107  CO2 24 23 21*  BUN 9 9 7   CREATININE 1.17 1.18 1.06  CALCIUM 7.3* 7.7* 7.6*  GLUCOSE 40* 223* 197*    Imaging/Diagnostic Tests: EEG 11/20/2020 This study is within normal limits. No seizures or epileptiform discharges were seen throughout the recording.  DG Chest 1 View 11/17/2020 IMPRESSION: No active disease.  CT Head WO Contrast 11/17/2020 IMPRESSION: 1. No evidence of acute intracranial abnormality. 2. Moderate cerebral and cerebellar atrophy, stable as compared to the head CT of 10/31/2020. 3. Right mastoid effusion.  Karle Plumber, Medical Student 11/21/2020, 7:01 AM Acting Intern, [place] Intern pager: [phone], text pages welcome   FPTS Upper-Level Resident Addendum   I have independently interviewed and examined the patient. I have discussed the above with the original author and agree with their documentation. Please see also any attending notes.   Carollee Leitz MD PGY-2, [place] [facility] 11/21/2020 5:51 PM  [place] Service pager: [phone] (text pages welcome through [facility])

## 2020-11-22 ENCOUNTER — Encounter (HOSPITAL_COMMUNITY): Payer: Self-pay | Admitting: Family Medicine

## 2020-11-22 DIAGNOSIS — R739 Hyperglycemia, unspecified: Secondary | ICD-10-CM

## 2020-11-22 LAB — HEPATIC FUNCTION PANEL
ALT: 24 U/L (ref 0–44)
AST: 41 U/L (ref 15–41)
Albumin: 1.5 g/dL — ABNORMAL LOW (ref 3.5–5.0)
Alkaline Phosphatase: 122 U/L (ref 38–126)
Bilirubin, Direct: 0.4 mg/dL — ABNORMAL HIGH (ref 0.0–0.2)
Indirect Bilirubin: 0.6 mg/dL (ref 0.3–0.9)
Total Bilirubin: 1 mg/dL (ref 0.3–1.2)
Total Protein: 4.7 g/dL — ABNORMAL LOW (ref 6.5–8.1)

## 2020-11-22 LAB — BASIC METABOLIC PANEL
Anion gap: 7 (ref 5–15)
BUN: 7 mg/dL (ref 6–20)
CO2: 22 mmol/L (ref 22–32)
Calcium: 7.5 mg/dL — ABNORMAL LOW (ref 8.9–10.3)
Chloride: 106 mmol/L (ref 98–111)
Creatinine, Ser: 1.04 mg/dL (ref 0.61–1.24)
GFR, Estimated: >60 mL/min (ref >60)
Glucose, Bld: 257 mg/dL — ABNORMAL HIGH (ref 70–99)
Potassium: 4.6 mmol/L (ref 3.5–5.1)
Sodium: 135 mmol/L (ref 135–145)

## 2020-11-22 LAB — CBC
HCT: 25 % — ABNORMAL LOW (ref 39.0–52.0)
Hemoglobin: 8.4 g/dL — ABNORMAL LOW (ref 13.0–17.0)
MCH: 30.7 pg (ref 26.0–34.0)
MCHC: 33.6 g/dL (ref 30.0–36.0)
MCV: 91.2 fL (ref 80.0–100.0)
Platelets: 279 10*3/uL (ref 150–400)
RBC: 2.74 MIL/uL — ABNORMAL LOW (ref 4.22–5.81)
RDW: 16.6 % — ABNORMAL HIGH (ref 11.5–15.5)
WBC: 6.8 10*3/uL (ref 4.0–10.5)
nRBC: 0 % (ref 0.0–0.2)

## 2020-11-22 LAB — CULTURE, BLOOD (ROUTINE X 2)
Culture: NO GROWTH
Culture: NO GROWTH

## 2020-11-22 LAB — GLUCOSE, CAPILLARY
Glucose-Capillary: 242 mg/dL — ABNORMAL HIGH (ref 70–99)
Glucose-Capillary: 363 mg/dL — ABNORMAL HIGH (ref 70–99)

## 2020-11-22 MED ORDER — METFORMIN HCL ER 500 MG PO TB24
500.0000 mg | ORAL_TABLET | Freq: Two times a day (BID) | ORAL | 1 refills | Status: DC
Start: 2020-11-22 — End: 2020-11-22

## 2020-11-22 MED ORDER — ONDANSETRON HCL 4 MG PO TABS: 4.0000 mg | ORAL_TABLET | Freq: Three times a day (TID) | ORAL | 0 refills | Status: DC

## 2020-11-22 MED ORDER — METFORMIN HCL ER 500 MG PO TB24: 500.0000 mg | ORAL_TABLET | Freq: Two times a day (BID) | ORAL | 1 refills | Status: DC

## 2020-11-22 MED ORDER — METFORMIN HCL ER 500 MG PO TB24: 500.0000 mg | ORAL_TABLET | Freq: Two times a day (BID) | ORAL | 2 refills | Status: DC

## 2020-11-22 NOTE — TOC Transition Note (Signed)
Transition of Care [facility]) - CM/SW Discharge Note   Patient Details  Name: Jeffrey [name] MRN: [id number] Date of Birth: 20-May-1966  [facility] CM/SW Contact:  Pollie Friar, RN Phone Number: 11/22/2020, 12:09 PM   Clinical Narrative:    Patient is discharging home with self care. No f/u per PT/OT and no DME needs.  Pt usually doesn't have transportation issues but wife is at chemo today and can not provide transport. Pt is going to take an Mel[employer] Popper and states he does this when needed.  Bedside RN updated.    Final next level of care: Home/Self Care Barriers to Discharge: No Barriers Identified   Patient Goals and CMS Choice        Discharge Placement                       Discharge Plan and Services                                     Social Determinants of Health (SDOH) Interventions     Readmission Risk Interventions No flowsheet data found.

## 2020-11-22 NOTE — Progress Notes (Signed)
Pt discharged at this time.  Has all belongings with him including glasses, phone and charger, clothing, and wallet.  He verbalizes understanding of all discharge instructions, follow up appointments, and where to pick up prescriptions.  He has no questions.  Home via taxi.

## 2020-11-22 NOTE — Discharge Summary (Addendum)
[facility] Discharge Summary  Patient name: Jeffrey [name] Medical record number: [id number] Date of birth: 1965/10/26 Age: 55 y.o. Gender: male Date of Admission: 11/17/2020  Date of Discharge: 11/22/2020 Admitting Physician: Lenoria Chime, MD  Primary Care Provider: [name], MD Consultants: GI,   Indication for Hospitalization: Syncope  Discharge Diagnoses/Problem List:  Syncopal episodes, improved Seizure disorder Anorexia Hypertension CKD Erosive esophagitis Diabetes Depression OSA Hyperlipidemia  Disposition: Discharge home  Discharge Condition: Stable  Discharge Exam:  General: Alert and cooperative and appears to be in no acute distress.  Seated eating breakfast comfortably in his chair in the exam room.  Able to stand up from his chair independently and walk comfortably around his exam room without trouble. Cardio: Normal S1 and S2, no S3 or S4. Rhythm is regular. No murmurs or rubs.   Pulm: Clear to auscultation bilaterally, no crackles, wheezing, or diminished breath sounds. Normal respiratory effort Abdomen: Bowel sounds normal. Abdomen soft and non-tender.  Extremities: No peripheral edema. Warm/ well perfused.  Strong radial pulses.   Brief Hospital Course:  Jeffrey [name] is a 55 y.o. male presented to the ED with multiple episodes of weakness and shaking while walking. PMH is significant for cirrhosis, alcohol use disorder, T2DM, anoxic brain injury s/p cardiac arrest, HTN, HLD, OSA, CKD, seizure disorder.   Syncopal Episodes Echo shows an EF of 70-75% with a hyperdynamic left ventricle. Hypotensive on presentation which resolved with adequate IVF and encouraged PO intake.  Significant orthostatic hypotension on admission seem to improve with hydration and improved p.o. intake.  On the day of discharge, he was able to comfortably stand up from his chair and walk comfortably around his room without issue.  His episodes  were most likely related to orthostatic hypotension secondary to poor p.o. intake due to nausea and other symptoms of erosive esophagitis.  There is some concern that he may have additional underlying pathology leading to decreased appetite.  Seizure Disorder  Normal EEG 2/14 Previously well-controlled on Depakote ER 500mg  BID, but switched to Keppra two weeks prior to admission and has increased syncopal episodes. Received 1000mg  Keppra BID while admitted. Follow up with Dr. [name] outpatient for medication regimen    Anorexia  Weight Loss Patient admits to little PO intake 2/2 no appetite over last 6 months. Weight was 121kg in August 2021, 83.9kg on admission. EGD in January showed esophagitis and duodenitis. Additionally, patient is following with Heme-Onc for MGUS diagnosis in 05/2020. There is slight concern for underlying etiology and would consider further investigation in the outpatient setting.  His naltrexone was held on discharge due to concern this may be contributing to his poor appetite.   Hx of Cirrhosis  Alcohol Use Disorder Per GI note, unsure if patient is taking naltrexone which was prescribed in January, discontinued. Patient has decreased drinking. No CIWA protocol initiated this admission, and patient remained stable without signs of withdrawal.     Issues for Follow Up:  1. Monitor blood pressure.  Consider restarting his losartan and metoprolol as appropriate.  These were held on discharge due to his recent syncopal episodes. 2. Consider restarting his SGLT2 inhibitor.  Held at discharge due to concern for contribution to low blood pressure. 3. His Metformin was increased to 500 mg twice daily on discharge. 4. Ensure all age-appropriate cancer screening is complete.  In addition to his erosive esophagitis, he may have other reasons for his decreased appetite. 5. Ensure follow-up with hematology for monitoring of  his MGUS 6. Follow-up with neurology for appropriate titration  of his antiepileptic medication 7. Encourage alcohol abstinence  Significant Procedures: none  Significant Labs and Imaging:  Recent Labs  Lab 11/20/20 0246 11/21/20 0231 11/22/20 0328  WBC 7.5 8.4 6.8  HGB 8.5* 9.9* 8.4*  HCT 25.9* 30.9* 25.0*  PLT 339 346 279   Recent Labs  Lab 11/16/20 1552 11/17/20 1445 11/19/20 0424 11/20/20 0246 11/20/20 1111 11/21/20 0231 11/22/20 0328  NA 137   < > 136 139 136 136 135  K 3.9   < > 3.9 2.7* 3.9 4.8 4.6  CL 93*   < > 103 107 104 107 106  CO2 23   < > 24 24 23  21* 22  GLUCOSE 170*   < > 258* 40* 223* 197* 257*  BUN 6   < > 10 9 9 7 7   CREATININE 1.80*   < > 1.55* 1.17 1.18 1.06 1.04  CALCIUM 8.7   < > 7.4* 7.3* 7.7* 7.6* 7.5*  MG 1.4*  --   --   --  1.1*  --   --   ALKPHOS  --   --   --   --   --   --  122  AST  --   --   --   --   --   --  41  ALT  --   --   --   --   --   --  24  ALBUMIN  --   --   --   --   --   --  1.5*   < > = values in this interval not displayed.     Results/Tests Pending at Time of Discharge: none  Discharge Medications:  Allergies as of 11/22/2020      Reactions   Lisinopril Other (See Comments)   Pt reports nose bleed.   Drug Ingredient [spironolactone]    Hyperkalemia      Medication List    STOP taking these medications   Farxiga 10 MG Tabs tablet Generic drug: dapagliflozin propanediol   losartan 25 MG tablet Commonly known as: COZAAR   metoprolol succinate 50 MG 24 hr tablet Commonly known as: Toprol XL   naltrexone 50 MG tablet Commonly known as: DEPADE   potassium chloride SA 20 MEQ tablet Commonly known as: KLOR-CON   sildenafil 100 MG tablet Commonly known as: VIAGRA     TAKE these medications   aspirin EC 81 MG tablet Take 1 tablet (81 mg total) by mouth at bedtime.   atorvastatin 80 MG tablet Commonly known as: LIPITOR TAKE 1 TABLET (80 MG TOTAL) BY MOUTH AT BEDTIME.   Dexcom G6 Sensor Misc 1 Units by Does not apply route as directed.   Dexcom G6 Transmitter  Misc Use every 90 days   folic acid 1 MG tablet Commonly known as: FOLVITE Take 1 tablet (1 mg total) by mouth daily. What changed: when to take this   gabapentin 300 MG capsule Commonly known as: NEURONTIN Take 3 capsules (900 mg total) by mouth 3 (three) times daily as needed (Neuropathy).   levETIRAcetam 1000 MG tablet Commonly known as: KEPPRA Take 1 tablet (1,000 mg total) by mouth 2 (two) times daily.   lidocaine-prilocaine cream Commonly known as: EMLA Apply 1 application topically as needed (pain).   metFORMIN 500 MG 24 hr tablet Commonly known as: GLUCOPHAGE-XR Take 1 tablet (500 mg total) by mouth 2 (two) times daily with a meal.  What changed: when to take this Notes to patient: **DOSE INCREASED** To lower blood sugars. (increased metformin since stopping [place])   mirtazapine 30 MG tablet Commonly known as: REMERON Take 1 tablet (30 mg total) by mouth at bedtime.   ondansetron 4 MG tablet Commonly known as: ZOFRAN Take 1 tablet (4 mg total) by mouth 3 (three) times daily before meals. Notes to patient: **NEW** For nausea   pantoprazole 40 MG tablet Commonly known as: PROTONIX Take 1 tablet twice a day for 28 days followed by 1 tablet per day ongoing. What changed:   how much to take  how to take this  when to take this   thiamine 100 MG tablet Commonly known as: Vitamin B-1 Take 1 tablet (100 mg total) by mouth daily.   True Metrix Blood Glucose Test test strip Generic drug: glucose blood Please use to check blood sugar up to three times daily.       Discharge Instructions: Please refer to Patient Instructions section of EMR for full details.  Patient was counseled important signs and symptoms that should prompt return to medical care, changes in medications, dietary instructions, activity restrictions, and follow up appointments.   Follow-Up Appointments:  Follow-up [facility] Follow up on 11/24/2020.    Specialty: Family Medicine Why: Your appointment is scheduled for 10:30am. Please arrive 10 minutes prior to your appointment time. Contact information: [address] [id number] mc [address] [place]              Matilde Haymaker, MD 11/22/2020, 3:13 PM PGY-3, [place]

## 2020-11-22 NOTE — Discharge Instructions (Addendum)
STOP TAKING Sildenafil because this can decreased your blood pressure  STOP TAKING dapagliflozin [place]ade) because this can cause a decrease in fluid and blood pressure  INCREASE metformin to twice daily for blood sugar control since you are stopping dapagliflozin  STOP TAKING losartan and metoprolol because this can decreased your blood pressure. Follow up with your primary doctor on Friday  You were hospitalized due to significant dizziness and weakness while standing.  This is most likely due to dehydration and insufficient eating at home.  While you are here in the hospital, we provided fluids and food help you regain strength.  While you were here, your symptoms while standing seem to have dramatically improved.  Please stop the medications noted above because they may be contributing to low blood pressures when standing.  Your primary care doctor will help you restart these medications when appropriate.

## 2020-11-22 NOTE — Progress Notes (Signed)
Family Medicine Teaching Service Daily Progress Note Intern Pager: [phone]  Patient name: Jeffrey [name] Medical record number: [id number] Date of birth: 16-May-1966 Age: 55 y.o. Gender: male  Primary Care Provider: [name], MD Consultants: GI Code Status: Full  Pt Overview and Major Events to Date:  02/11- Admitted to hospital  Assessment and Plan: Jeffrey [name] is a 55 y.o. male presenting with multiple episodes of weakness and shaking while walking. PMH is significant for cirrhosis, alcohol use disorder, T2DM, anoxic brain injury s/p cardiac arrest, HTN, HLD, OSA, CKD, seizure disorder.  Syncopal Episodes Echo shows an EF of 70-75% with a hyperdynamic left ventricle. History of seizure disorder. Likely 2/2 to orthostatic hypotension, potentially due to neuropathy from DM or vitamin deficiency. Vitamin B12 level normal. Patient reports feeling improved since admission. EEG 2/14 showed no seizures or epileptiform discharges.  He was able to stand up and walk around his room this morning on physical exam.  He reports significant improvement in his episodes with standing up and moving around. -PT note 2/15: BP more stable today- Sitting BP 124/102, standing BP 111/86, sitting BP post walk 142/116. -Keppra level pending  -Continue to hold antihypertensives -PT/OT recs inpatient therapy while admitted, no need for outpatient PT, anticipate mobility and strength will improve quickly with increased activity; ambulate patient as tolerated, increased time between positional changes -Encourage PO intake   Seizure Disorder  Normal EEG 2/14 -Per conversation with Dr. Charlsie Merles, continue patient on 1000mg  Keppra BID until further med adjustment as an outpatient with primary neurologist Dr. [name]  Anorexia  Weight Loss Patient admits to little PO intake 2/2 no appetite over last 6 months. Per chart review, he was 121kg in August 2021, now 84.1kg.  -GI recs: discontinue naltrexone and  consider reducing gabapentin dose, could be contributing to decreased appetite; better control blood sugars; add Zofran 4mg  TID ac; no plans to repeat EGD or CT -Ensure supplements twice daily while inpatient    Hx of HTN with Hypotension on Admission Blood pressures 136-169/93-123 sitting over last 24 hours. Orthostatic vitals as above.  -Continue to hold home BP medications due to orthostasis    AKI on CKD, resolved Baseline creatinine 1.0. Admission Cr 2.1>1.04. -Encourage PO intake -Daily BMP  Recent GI Bleed 2/2 Severe Erosive Esophagitis No current signs of bleeding. Admission Hgb 11.6>8.4. Most likely secondary to hemoconcentration on presentation to the hospital.  -GI recs: no plans to repeat EGD or CT at this time, continue Protonix  -Daily CBC  Hx of Cirrhosis  Alcohol Use Disorder Per GI note, unsure if patient is taking naltrexone and could be linked to nausea. Patient has decreased drinking since admission in January. -Continue daily thiamine, folic acid and multivitamins -D/c naltrexone  T2DM A1c 1/26: 6.9%. Home Metformin and Farxiga. Blood glucose 242-313 over the past 24 hours. -CBG monitoring  Depression -Continue home Mirtazapine 30mg  qhs  OSA on CPAP -CPAP at night  HLD  S/p cardiac arrest -Continue home ASA 81mg  qd, atorvastatin 80mg  qd  FEN/GI: Regular diet Prophylaxis: Lovenox 40mg  subcut  Status is: Inpatient, FPTS, attending Dr. Thompson Grayer  Subjective:  Patient sitting eating breakfast this am. No current complaints. States he feels steady on his feet if he slowly changes positions. Mild nausea relieved with Zofran. No vomiting, diarrhea, chest pain, SOB. Repeats he would like to go home.   Objective: Temp:  [98 F (36.7 C)-98.4 F (36.9 C)] 98.1 F (36.7 C) (02/16 0441) Pulse Rate:  [87-110] 91 (02/16 0441)  Resp:  [16-18] 18 (02/16 0441) BP: (136-169)/(93-123) 136/94 (02/16 0441) SpO2:  [99 %-100 %] 99 % (02/16 0441) Weight:  [89.2 kg]  89.2 kg (02/16 0500)  Physical Exam:  General: 55 y.o. male in NAD, sitting up comfortably.  Seated comfortably in his chair eating breakfast. Cardio: S1S2 noted, no S3S4 appreciated. Pulses intact  Lungs: Clear to auscultation bilaterally, no wheezes or rales. No increased WOB on room air.  Abdomen: Soft, nontender and nondistended.  Skin: Warm and dry Extremities: 1+ pitting edema to level of the mid-shins   Laboratory: Recent Labs  Lab 11/20/20 0246 11/21/20 0231 11/22/20 0328  WBC 7.5 8.4 6.8  HGB 8.5* 9.9* 8.4*  HCT 25.9* 30.9* 25.0*  PLT 339 346 279   Recent Labs  Lab 11/20/20 1111 11/21/20 0231 11/22/20 0328  NA 136 136 135  K 3.9 4.8 4.6  CL 104 107 106  CO2 23 21* 22  BUN 9 7 7   CREATININE 1.18 1.06 1.04  CALCIUM 7.7* 7.6* 7.5*  GLUCOSE 223* 197* 257*   Imaging/Diagnostic Tests: EEG 11/20/2020 This study is within normal limits. No seizures or epileptiform discharges were seen throughout the recording.  DG Chest 1 View 11/17/2020 IMPRESSION: No active disease.  CT Head WO Contrast 11/17/2020 IMPRESSION: 1. No evidence of acute intracranial abnormality. 2. Moderate cerebral and cerebellar atrophy, stable as compared to the head CT of 10/31/2020. 3. Right mastoid effusion.  Karle Plumber, Medical Student 11/22/2020, 7:27 AM Acting Intern, [place] Intern pager: [phone], text pages welcome  FPTS Upper-Level Resident Addendum   I have independently interviewed and examined the patient. I have discussed the above with the original author and agree with their documentation. My edits for correction/addition/clarification are in blue. Please see also any attending notes.    Matilde Haymaker MD PGY-3, [place] [facility] 11/22/2020 3:12 PM  FPTS Service pager: [phone] (text pages welcome through [facility])

## 2020-11-23 LAB — URINE CULTURE: Culture: >=100000 — AB

## 2020-11-24 ENCOUNTER — Ambulatory Visit (INDEPENDENT_AMBULATORY_CARE_PROVIDER_SITE_OTHER): Payer: 59 | Admitting: Family Medicine

## 2020-11-24 ENCOUNTER — Other Ambulatory Visit: Payer: Self-pay

## 2020-11-24 VITALS — BP 143/81 | HR 91 | Ht 70.0 in | Wt 253.4 lb

## 2020-11-24 DIAGNOSIS — Z794 Long term (current) use of insulin: Secondary | ICD-10-CM

## 2020-11-24 DIAGNOSIS — Z1211 Encounter for screening for malignant neoplasm of colon: Secondary | ICD-10-CM

## 2020-11-24 DIAGNOSIS — E785 Hyperlipidemia, unspecified: Secondary | ICD-10-CM

## 2020-11-24 DIAGNOSIS — Z09 Encounter for follow-up examination after completed treatment for conditions other than malignant neoplasm: Secondary | ICD-10-CM | POA: Diagnosis not present

## 2020-11-24 DIAGNOSIS — D472 Monoclonal gammopathy: Secondary | ICD-10-CM

## 2020-11-24 DIAGNOSIS — E1169 Type 2 diabetes mellitus with other specified complication: Secondary | ICD-10-CM

## 2020-11-24 DIAGNOSIS — R531 Weakness: Secondary | ICD-10-CM

## 2020-11-24 DIAGNOSIS — I10 Essential (primary) hypertension: Secondary | ICD-10-CM

## 2020-11-24 DIAGNOSIS — E1165 Type 2 diabetes mellitus with hyperglycemia: Secondary | ICD-10-CM | POA: Diagnosis not present

## 2020-11-24 DIAGNOSIS — E7800 Pure hypercholesterolemia, unspecified: Secondary | ICD-10-CM

## 2020-11-24 DIAGNOSIS — E1140 Type 2 diabetes mellitus with diabetic neuropathy, unspecified: Secondary | ICD-10-CM

## 2020-11-24 LAB — LEVETIRACETAM LEVEL: Levetiracetam Lvl: 52.4 ug/mL — ABNORMAL HIGH (ref 10.0–40.0)

## 2020-11-24 MED ORDER — DAPAGLIFLOZIN PROPANEDIOL 10 MG PO TABS: 10.0000 mg | ORAL_TABLET | Freq: Every day | ORAL | 3 refills | Status: DC

## 2020-11-24 MED ORDER — FIASP FLEXTOUCH 100 UNIT/ML ~~LOC~~ SOPN: 4.0000 [IU] | PEN_INJECTOR | Freq: Three times a day (TID) | SUBCUTANEOUS | 0 refills | Status: DC

## 2020-11-24 MED ORDER — BASAGLAR KWIKPEN 100 UNIT/ML ~~LOC~~ SOPN: 20.0000 [IU] | PEN_INJECTOR | Freq: Every day | SUBCUTANEOUS | 0 refills | Status: DC

## 2020-11-24 MED ORDER — ATORVASTATIN CALCIUM 80 MG PO TABS: 80.0000 mg | ORAL_TABLET | Freq: Every day | ORAL | 3 refills | Status: DC

## 2020-11-24 NOTE — Patient Instructions (Addendum)
Thank you for coming in to see [name] today! Please see below to review our plan for today's visit:  1. I recommend eating 5 small meals daily.  2. I have made a referral to GI for a colonoscopy.  3. Start taking your Metformin 500mg  twice daily for diabetes. Do NOT take Metoprolol. 4. Start taking Wilder Glade once daily. 5. Please follow up with [name] in 1 week.   Please call the clinic at [phone] if your symptoms worsen or you have any concerns. It was our pleasure to serve you!   Dr. Milus Banister [facility]

## 2020-11-24 NOTE — Progress Notes (Signed)
    SUBJECTIVE:   CHIEF COMPLAINT / HPI:   Hospital follow up: Patient presents today for hospital follow-up for recurring seizures, decreased p.o. intake related to erosive esophagitis.  Issues for Follow Up:  1. Monitor blood pressure.  Consider restarting his losartan and metoprolol as appropriate: BP today 143/81; however, patient with orthostatic hypotension.  We will continue to hold losartan, metoprolol until next visit in the office.  -Lying 139/95, heart rate 91  -Sitting 130/90, heart rate 95  -Standing 113/83, heart rate 114 2. Consider restarting his SGLT2 inhibitor. Held at discharge due to concern for contribution to low blood pressure. - Patient with orthostasis, however with recently poorly controlled diabetes, feel it is safe to restart [place]. 3. His Metformin was increased to 500 mg twice daily on discharge. - Patient had only been taking his Metformin 500 mg once daily, with instructed to take medication twice daily. 4. Ensure all age-appropriate cancer screening is complete.  In addition to his erosive esophagitis, he may have other reasons for his decreased appetite. - he denies pain with eating just has decreased appetite.  Ambulatory referral was made to GI today for colonoscopy. 5. Ensure follow-up with hematology for monitoring of his MGUS - pt has appt next week 6. Follow-up with neurology for appropriate titration of his antiepileptic medication - no follow up has yet been scheduled with Dr. [name] due to scheduling difficulty (no appts available for a couple of months), however patient plans to call again today. He has not any more episodes of seizures since he has been home from the hospital.   7. Encourage alcohol abstinence - patient reports abstinence from alcohol.  His last drink was October 30, 2020.  8. Urine culture results returned showing >100CF ESBL- discussed with ID Dr [place], due to negative blood cultures and patient without urinary symptoms, would not  recommend treating nor repeating urine culture if no symptoms. - patient denies urinary frequency, urgency, dysuria, body aches, chills  PERTINENT  PMH / PSH: Cirrhosis, HTN, HLD, T2DM, Anoxic brain injury, seizures, History of Alcohol use disorder  OBJECTIVE:   BP (!) 143/81   Pulse 91   Ht 5\' 10"  (1.778 m)   Wt 253 lb 6.4 oz (114.9 kg)   SpO2 100%   BMI 36.36 kg/m    Physical exam: General: Nontoxic-appearing, patient appears better today than previous visit, he has more energy/appears less fatigued Respiratory: Comfortable work of breathing, speaking complete sentences Cardio: RRR, S1-S2 present, no murmurs appreciated   ASSESSMENT/PLAN:   Essential hypertension Patient's blood pressure today 143/81, however patient experiencing orthostatic hypotension.  Currently holding patient's losartan, metoprolol, and Farxiga due to concerns for low blood pressure, syncopal event. -We will restart patient's Farxiga for cardiac/renal benefit -Continue to hold losartan, metoprolol -Reevaluate with orthostatics at next office visit  Hyperlipidemia associated with type 2 diabetes mellitus ([place]) -Patient's atorvastatin 80 refilled today  Colon cancer screening Patient with history of hematemesis, erosive esophagitis. -Referral made to GI for follow-up for hematemesis, erosive esophagitis, as well as colonoscopy  Weakness -Improved  MGUS (monoclonal gammopathy of unknown significance) -Patient will follow up this week scheduled with hematology/oncology     Daisy Floro, [place]

## 2020-11-24 NOTE — Progress Notes (Signed)
Asked by Dr. Ouida Sills to assist with sample medication supply and refills.   Patient reports need for both insulins.  He reports good control and shared his CGM reading of 108 while in office.   Medication Samples have been provided to the patient.  Drug name: Fiasp (insulin aspart)       Strength: 100units/ml        Qty: 2 pens  LOT: [id number]  Exp.Date: 06/06/2022  Dosing instructions: Continue 4-6 units prior to meals    Drug name: Basaglar (insulin glargine)       Strength: 100units/ml        Qty: 2 pens  LOT: [id number]  Exp.Date: 04/05/2022  Dosing instructions: Continue 20 units once daily in the AM *Patient previously taking Lantus - he is aware this insulin should be used at the same dose.  The patient has been instructed regarding the correct time, dose, and frequency of taking this medication, including desired effects and most common side effects.   Janeann Forehand 12:26 PM 11/24/2020   Patient also requested refill of Atorvastatin.  Stated he ran out in January.  Refill sent to his pharmacy.

## 2020-11-28 NOTE — Assessment & Plan Note (Signed)
Patient's blood pressure today 143/81, however patient experiencing orthostatic hypotension.  Currently holding patient's losartan, metoprolol, and Farxiga due to concerns for low blood pressure, syncopal event. -We will restart patient's Farxiga for cardiac/renal benefit -Continue to hold losartan, metoprolol -Reevaluate with orthostatics at next office visit

## 2020-11-28 NOTE — Assessment & Plan Note (Signed)
-  Patient will follow up this week scheduled with hematology/oncology

## 2020-11-28 NOTE — Assessment & Plan Note (Addendum)
Patient with history of hematemesis, erosive esophagitis. -Referral made to GI for follow-up for hematemesis, erosive esophagitis, as well as colonoscopy

## 2020-11-28 NOTE — Assessment & Plan Note (Signed)
Improved

## 2020-11-28 NOTE — Assessment & Plan Note (Signed)
-  Patient's atorvastatin 80 refilled today

## 2020-12-04 ENCOUNTER — Inpatient Hospital Stay: Payer: 59 | Attending: Hematology and Oncology

## 2020-12-10 NOTE — Assessment & Plan Note (Deleted)
05/01/2020: M spike 0.3 g, kappa 57, lambda 70, ratio 0.81 WBC 12, Creatinine is 1.3, calcium 9.1, IgG: 1518, IgA 544 IgG lambda monoclonal protein  Workup recommended: Bone survey I did not recommend a bone marrow biopsy based upon the blood work.  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  He is very proud of his work and is very happy about it.  11/01/20: CT CAP: Duodenitis, chronic pancreatitis, Hepatic Steatosis 11/18/20: No abnormality. Labs not done

## 2020-12-11 ENCOUNTER — Inpatient Hospital Stay: Payer: 59 | Attending: Hematology and Oncology | Admitting: Hematology and Oncology

## 2020-12-11 DIAGNOSIS — D472 Monoclonal gammopathy: Secondary | ICD-10-CM

## 2020-12-11 NOTE — Progress Notes (Incomplete)
Patient Care Team: [name], MD as PCP - General (Family Medicine)  DIAGNOSIS:    ICD-10-CM   1. MGUS (monoclonal gammopathy of unknown significance)  D47.2     CHIEF COMPLIANT: Follow-up of MGUS  INTERVAL HISTORY: Jeffrey [name] is a 55 y.o. with above-mentioned history of MGUS. I last saw Jeffrey [name] a year ago, and in the interim Jeffrey [name] was admitted 3 times. 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 Jeffrey [name] presents to the clinic today for follow-up.   ALLERGIES:  is allergic to lisinopril and drug ingredient [spironolactone].  MEDICATIONS:  Current Outpatient Medications  Medication Sig Dispense Refill  . aspirin EC 81 MG tablet Take 1 tablet (81 mg total) by mouth at bedtime. 90 tablet 3  . atorvastatin (LIPITOR) 80 MG tablet Take 1 tablet (80 mg total) by mouth at bedtime. 90 tablet 3  . Continuous Blood Gluc Sensor (DEXCOM G6 SENSOR) MISC 1 Units by Does not apply route as directed. 1 each 3  . Continuous Blood Gluc Transmit (DEXCOM G6 TRANSMITTER) MISC Use every 90 days 1 each 3  . dapagliflozin propanediol (FARXIGA) 10 MG TABS tablet Take 1 tablet (10 mg total) by mouth daily. 90 tablet 3  . folic acid (FOLVITE) 1 MG tablet Take 1 tablet (1 mg total) by mouth daily. (Patient taking differently: Take 1 mg by mouth at bedtime.) 90 tablet 3  . gabapentin (NEURONTIN) 300 MG capsule Take 3 capsules (900 mg total) by mouth 3 (three) times daily as needed (Neuropathy). 90 capsule 0  . glucose blood (TRUE METRIX BLOOD GLUCOSE TEST) test strip Please use to check blood sugar up to three times daily.  100 each PRN  . insulin aspart (FIASP FLEXTOUCH) 100 UNIT/ML FlexTouch Pen Inject 4-6 Units into the skin 3 (three) times daily before meals. 6 mL 0  . Insulin  Glargine (BASAGLAR KWIKPEN) 100 UNIT/ML Inject 20 Units into the skin daily. 6 mL 0  . levETIRAcetam (KEPPRA) 1000 MG tablet Take 1 tablet (1,000 mg total) by mouth 2 (two) times daily. 60 tablet 1  . lidocaine-prilocaine (EMLA) cream Apply 1 application topically as needed (pain).    . metFORMIN (GLUCOPHAGE-XR) 500 MG 24 hr tablet Take 1 tablet (500 mg total) by mouth 2 (two) times daily with a meal. 60 tablet 2  . mirtazapine (REMERON) 30 MG tablet Take 1 tablet (30 mg total) by mouth at bedtime. 30 tablet 1  . ondansetron (ZOFRAN) 4 MG tablet Take 1 tablet (4 mg total) by mouth 3 (three) times daily before meals. 30 tablet 0  . pantoprazole (PROTONIX) 40 MG tablet Take 1 tablet twice a day for 28 days followed by 1 tablet per day ongoing. (Patient taking differently: Take 40 mg by mouth 2 (two) times daily. Take 1 tablet twice a day for 28 days followed by 1 tablet per day ongoing.) 60 tablet 0  . thiamine (VITAMIN B-1) 100 MG tablet Take 1 tablet (100 mg total) by mouth daily. 30 tablet 12   No current facility-administered medications for this visit.    PHYSICAL EXAMINATION: ECOG PERFORMANCE STATUS: {CHL ONC ECOG [phone]}  There were no vitals filed for this visit. There were no vitals filed for this visit.  LABORATORY DATA:  I have reviewed the data as listed CMP Latest Ref Rng & Units 11/22/2020 11/21/2020 11/20/2020  Glucose 70 - 99 mg/dL 257(H) 197(H) 223(H)  BUN 6 - 20 mg/dL 7 7 9   Creatinine 0.61 - 1.24 mg/dL 1.04 1.06 1.18  Sodium 135 - 145 mmol/L 135 136 136  Potassium 3.5 - 5.1 mmol/L 4.6 4.8 3.9  Chloride 98 - 111 mmol/L 106 107 104  CO2 22 - 32 mmol/L 22 21(L) 23  Calcium 8.9 - 10.3 mg/dL 7.5(L) 7.6(L) 7.7(L)  Total Protein 6.5 - 8.1 g/dL 4.7(L) - -  Total Bilirubin 0.3 - 1.2 mg/dL 1.0 - -  Alkaline Phos 38 - 126 U/L 122 - -  AST 15 - 41 U/L 41 - -  ALT 0 - 44 U/L 24 - -    Lab Results  Component Value Date   WBC 6.8 11/22/2020   HGB 8.4 (L) 11/22/2020   HCT  25.0 (L) 11/22/2020   MCV 91.2 11/22/2020   PLT 279 11/22/2020   NEUTROABS 5.4 11/16/2020    ASSESSMENT & PLAN:  No problem-specific Assessment & Plan notes found for this encounter.    No orders of the defined types were placed in this encounter.  The patient has a good understanding of the overall plan. Jeffrey [name] agrees with it. Jeffrey [name] will call with any problems that may develop before the next visit here.  Total time spent: *** mins including face to face time and time spent for planning, charting and coordination of care  Rulon Eisenmenger, MD, MPH 12/11/2020  I, Cloyde Reams Dorshimer, am acting as scribe for Dr. Nicholas Lose.  {insert scribe attestation}

## 2020-12-14 ENCOUNTER — Ambulatory Visit: Payer: 59 | Admitting: Family Medicine

## 2020-12-17 ENCOUNTER — Other Ambulatory Visit: Payer: Self-pay

## 2020-12-17 ENCOUNTER — Emergency Department (HOSPITAL_COMMUNITY)
Admission: EM | Admit: 2020-12-17 | Discharge: 2020-12-17 | Disposition: A | Discharge status: Home / Self Care | Payer: 59 | Attending: Emergency Medicine | Admitting: Emergency Medicine

## 2020-12-17 ENCOUNTER — Emergency Department (HOSPITAL_COMMUNITY): Payer: 59

## 2020-12-17 DIAGNOSIS — E1140 Type 2 diabetes mellitus with diabetic neuropathy, unspecified: Secondary | ICD-10-CM | POA: Insufficient documentation

## 2020-12-17 DIAGNOSIS — E1122 Type 2 diabetes mellitus with diabetic chronic kidney disease: Secondary | ICD-10-CM | POA: Insufficient documentation

## 2020-12-17 DIAGNOSIS — R7989 Other specified abnormal findings of blood chemistry: Secondary | ICD-10-CM | POA: Diagnosis not present

## 2020-12-17 DIAGNOSIS — I129 Hypertensive chronic kidney disease with stage 1 through stage 4 chronic kidney disease, or unspecified chronic kidney disease: Secondary | ICD-10-CM | POA: Diagnosis not present

## 2020-12-17 DIAGNOSIS — E162 Hypoglycemia, unspecified: Secondary | ICD-10-CM

## 2020-12-17 DIAGNOSIS — Z7982 Long term (current) use of aspirin: Secondary | ICD-10-CM | POA: Diagnosis not present

## 2020-12-17 DIAGNOSIS — Z7984 Long term (current) use of oral hypoglycemic drugs: Secondary | ICD-10-CM | POA: Insufficient documentation

## 2020-12-17 DIAGNOSIS — D72829 Elevated white blood cell count, unspecified: Secondary | ICD-10-CM | POA: Diagnosis not present

## 2020-12-17 DIAGNOSIS — Z794 Long term (current) use of insulin: Secondary | ICD-10-CM | POA: Diagnosis not present

## 2020-12-17 DIAGNOSIS — R601 Generalized edema: Secondary | ICD-10-CM | POA: Insufficient documentation

## 2020-12-17 DIAGNOSIS — Z79899 Other long term (current) drug therapy: Secondary | ICD-10-CM | POA: Diagnosis not present

## 2020-12-17 DIAGNOSIS — N1831 Chronic kidney disease, stage 3a: Secondary | ICD-10-CM | POA: Diagnosis not present

## 2020-12-17 DIAGNOSIS — E11649 Type 2 diabetes mellitus with hypoglycemia without coma: Secondary | ICD-10-CM | POA: Diagnosis not present

## 2020-12-17 LAB — CBC WITH DIFFERENTIAL/PLATELET
Abs Immature Granulocytes: 0.09 10*3/uL — ABNORMAL HIGH (ref 0.00–0.07)
Basophils Absolute: 0 10*3/uL (ref 0.0–0.1)
Basophils Relative: 0 %
Eosinophils Absolute: 0 10*3/uL (ref 0.0–0.5)
Eosinophils Relative: 0 %
HCT: 33 % — ABNORMAL LOW (ref 39.0–52.0)
Hemoglobin: 10.7 g/dL — ABNORMAL LOW (ref 13.0–17.0)
Immature Granulocytes: 1 %
Lymphocytes Relative: 12 %
Lymphs Abs: 1.7 10*3/uL (ref 0.7–4.0)
MCH: 29.2 pg (ref 26.0–34.0)
MCHC: 32.4 g/dL (ref 30.0–36.0)
MCV: 89.9 fL (ref 80.0–100.0)
Monocytes Absolute: 0.8 10*3/uL (ref 0.1–1.0)
Monocytes Relative: 6 %
Neutro Abs: 11.8 10*3/uL — ABNORMAL HIGH (ref 1.7–7.7)
Neutrophils Relative %: 81 %
Platelets: 323 10*3/uL (ref 150–400)
RBC: 3.67 MIL/uL — ABNORMAL LOW (ref 4.22–5.81)
RDW: 17.7 % — ABNORMAL HIGH (ref 11.5–15.5)
WBC: 14.4 10*3/uL — ABNORMAL HIGH (ref 4.0–10.5)
nRBC: 0 % (ref 0.0–0.2)

## 2020-12-17 LAB — COMPREHENSIVE METABOLIC PANEL
ALT: 43 U/L (ref 0–44)
AST: 105 U/L — ABNORMAL HIGH (ref 15–41)
Albumin: 1.8 g/dL — ABNORMAL LOW (ref 3.5–5.0)
Alkaline Phosphatase: 166 U/L — ABNORMAL HIGH (ref 38–126)
Anion gap: 10 (ref 5–15)
BUN: <5 mg/dL — ABNORMAL LOW (ref 6–20)
CO2: 28 mmol/L (ref 22–32)
Calcium: 7.6 mg/dL — ABNORMAL LOW (ref 8.9–10.3)
Chloride: 99 mmol/L (ref 98–111)
Creatinine, Ser: 1.03 mg/dL (ref 0.61–1.24)
GFR, Estimated: >60 mL/min (ref >60)
Glucose, Bld: 121 mg/dL — ABNORMAL HIGH (ref 70–99)
Potassium: 3.9 mmol/L (ref 3.5–5.1)
Sodium: 137 mmol/L (ref 135–145)
Total Bilirubin: 1.1 mg/dL (ref 0.3–1.2)
Total Protein: 6.1 g/dL — ABNORMAL LOW (ref 6.5–8.1)

## 2020-12-17 LAB — URINALYSIS, ROUTINE W REFLEX MICROSCOPIC
Bacteria, UA: NONE SEEN
Bilirubin Urine: NEGATIVE
Glucose, UA: >=500 mg/dL — AB
Hgb urine dipstick: NEGATIVE
Ketones, ur: NEGATIVE mg/dL
Leukocytes,Ua: NEGATIVE
Nitrite: NEGATIVE
Protein, ur: NEGATIVE mg/dL
Specific Gravity, Urine: 1.014 (ref 1.005–1.030)
pH: 5 (ref 5.0–8.0)

## 2020-12-17 LAB — CBG MONITORING, ED
Glucose-Capillary: 105 mg/dL — ABNORMAL HIGH (ref 70–99)
Glucose-Capillary: 96 mg/dL (ref 70–99)

## 2020-12-17 LAB — ETHANOL: Alcohol, Ethyl (B): <10 mg/dL (ref <10)

## 2020-12-17 LAB — BRAIN NATRIURETIC PEPTIDE: B Natriuretic Peptide: 741.3 pg/mL — ABNORMAL HIGH (ref 0.0–100.0)

## 2020-12-17 MED ORDER — FUROSEMIDE 20 MG PO TABS
20.0000 mg | ORAL_TABLET | Freq: Every day | ORAL | 0 refills | Status: DC
Start: 2020-12-17 — End: 2020-12-27

## 2020-12-17 MED ORDER — FUROSEMIDE 10 MG/ML IJ SOLN
20.0000 mg | Freq: Once | INTRAMUSCULAR | Status: AC
  Administered 2020-12-17: 20 mg via INTRAVENOUS
  Filled 2020-12-17: qty 2

## 2020-12-17 NOTE — Discharge Instructions (Signed)
As discussed, today's evaluation has been generally reassuring.  However, you do have evidence that your body is retaining fluid from your recent hospitalization.  You are starting a new medication.  Please discuss this with your physician within the next week.  If you develop new, or concerning changes return here.

## 2020-12-17 NOTE — ED Provider Notes (Signed)
[facility] EMERGENCY DEPARTMENT Provider Note   CSN: [id number] Arrival date & time: 12/17/20  1628     History Chief Complaint  Patient presents with  . Hypoglycemia    Jeffrey [name] is a 55 y.o. male.  HPI Patient presents from home with concern for blurry vision, slurred speech. Patient has multiple medical issues, and was hospitalized for almost 2 weeks recently. He notes that however, he was in his usual state of health until just prior to EMS notification. He had been take his medication as directed, without memorable changes in diet or activity.  He had slow onset speech difficulty, lightheadedness, blurred vision.  After checking his glucose, finding it to be 60, he called for EMS assistance. After receiving sugar supplement the patient's glucose went up, and currently states that he feels substantially better, no blurred vision, no difficulty speaking, swallowing, no pain. He does note that since hospital discharge he has had persistent edema.  EMS reports the patient was awake and alert, interactive during transport, without rebound hypoglycemia.    Past Medical History:  Diagnosis Date  . Acute renal insufficiency 12/08/2012  . AKI (acute kidney injury) ([place])   . ALCOHOL ABUSE, HX OF 11/10/2007  . Anoxic encephalopathy ([place])   . Anxiety   . Bleeding ulcer 2014  . Blood transfusion 2014   "related to bleeding ulcer"  . Cardiac arrest ([place]) 12/07/2012   Anoxic encephalopathy  . GERD (gastroesophageal reflux disease)   . High cholesterol   . Hypertension   . OSA on CPAP 12/07/2012  . Seizure-like activity ([place])   . Type II diabetes mellitus ([place])   . Venous insufficiency 12/13/2011    Patient Active Problem List   Diagnosis Date Noted  . Hospital discharge follow-up 11/24/2020  . Syncope 11/17/2020  . Hypomagnesemia 11/05/2020  . Hematemesis with nausea   . Alcohol abuse with intoxication ([place])   . Duodenal ulcer hemorrhagic   . GI bleed  10/31/2020  . Hyperglycemia 10/31/2020  . DKA (diabetic ketoacidosis) ([place]) 07/22/2020  . Candidiasis 07/22/2020  . Hyponatremia 06/28/2020  . Eyebrow laceration, left, initial encounter   . Hypokalemia   . Hyperlipidemia associated with type 2 diabetes mellitus ([place])   . MGUS (monoclonal gammopathy of unknown significance) 05/31/2020  . Seizure disorder ([place]) 03/16/2020  . Type 2 diabetes mellitus with hyperglycemia, with long-term current use of insulin ([place]) 03/16/2020  . Tremor 03/16/2020  . Colon cancer screening 02/22/2020  . Stage 3a chronic kidney disease ([place]) 01/29/2020  . Hyperphosphatemia 01/29/2020  . Major depressive disorder, recurrent episode ([place]) 01/13/2018  . Weakness   . Hypotension 07/27/2017  . Seizure ([place]) 04/08/2017  . Proteinuria 11/23/2015  . Essential hypertension   . Cirrhosis ([place]) 03/22/2015  . Ataxia 03/20/2015  . Dysarthria 03/09/2015  . AKI (acute kidney injury) ([place])   . Acute blood loss anemia 11/17/2014  . ETOH abuse   . Hyperkalemia   . Tachycardia   . Melena 10/10/2014  . Erosive esophagitis 10/10/2014  . Somnolence 10/08/2014  . Acute kidney injury ([place])   . OSA on CPAP 12/28/2012  . Cardiac arrest ([place]) 12/07/2012  . Anoxic brain injury ([place]) 12/07/2012  . ERECTILE DYSFUNCTION 04/14/2007  . Type 2 diabetes mellitus with diabetic neuropathy, with long-term current use of insulin ([place]) 12/04/2006  . HYPERCHOLESTEROLEMIA 12/04/2006    Past Surgical History:  Procedure Laterality Date  . BIOPSY  11/01/2020   Procedure: BIOPSY;  Surgeon: Irene Shipper, MD;  Location:  [facility] ENDOSCOPY;  Service: Endoscopy;;  . CARDIOVASCULAR STRESS TEST  03/16/13   Very Poor Exercise Tolerance; NON DIAGNOSTIC TEST  . CATARACT EXTRACTION W/ INTRAOCULAR LENS IMPLANT Left 03/07/2014   [place] @ [facility]  . ESOPHAGOGASTRODUODENOSCOPY Left 11/26/2012   Procedure: ESOPHAGOGASTRODUODENOSCOPY (EGD);  Surgeon: Wonda Horner, MD;  Location: [facility] ENDOSCOPY;   Service: Endoscopy;  Laterality: Left;  . ESOPHAGOGASTRODUODENOSCOPY N/A 10/10/2014   Procedure: ESOPHAGOGASTRODUODENOSCOPY (EGD);  Surgeon: Ladene Artist, MD;  Location: [facility] ENDOSCOPY;  Service: Endoscopy;  Laterality: N/A;  . ESOPHAGOGASTRODUODENOSCOPY (EGD) WITH PROPOFOL N/A 11/01/2020   Procedure: ESOPHAGOGASTRODUODENOSCOPY (EGD) WITH PROPOFOL;  Surgeon: Irene Shipper, MD;  Location: [facility] ENDOSCOPY;  Service: Endoscopy;  Laterality: N/A;  . KNEE ARTHROSCOPY Left ~ 1999       Family History  Problem Relation Age of Onset  . Other Mother        Unsure of medical history.  . Diabetes Mellitus II Father     Social History   Tobacco Use  . Smoking status: Never Smoker  . Smokeless tobacco: Current User    Types: Snuff  Vaping Use  . Vaping Use: Never used  Substance Use Topics  . Alcohol use: Yes    Alcohol/week: 6.0 standard drinks    Types: 6 Cans of beer per week  . Drug use: No    Home Medications Prior to Admission medications   Medication Sig Start Date End Date Taking? Authorizing Provider  furosemide (LASIX) 20 MG tablet Take 1 tablet (20 mg total) by mouth daily. 12/17/20  Yes Carmin Muskrat, MD  aspirin EC 81 MG tablet Take 1 tablet (81 mg total) by mouth at bedtime. 06/24/19   Zenia Resides, MD  atorvastatin (LIPITOR) 80 MG tablet Take 1 tablet (80 mg total) by mouth at bedtime. 11/24/20   Zenia Resides, MD  Continuous Blood Gluc Sensor (DEXCOM G6 SENSOR) MISC 1 Units by Does not apply route as directed. 09/26/20   [name], MD  Continuous Blood Gluc Transmit (DEXCOM G6 TRANSMITTER) MISC Use every 90 days 10/05/20   [name], MD  dapagliflozin propanediol (FARXIGA) 10 MG TABS tablet Take 1 tablet (10 mg total) by mouth daily. 11/24/20   Daisy Floro, DO  folic acid (FOLVITE) 1 MG tablet Take 1 tablet (1 mg total) by mouth daily. Patient taking differently: Take 1 mg by mouth at bedtime. 07/04/20   [name], MD  gabapentin (NEURONTIN)  300 MG capsule Take 3 capsules (900 mg total) by mouth 3 (three) times daily as needed (Neuropathy). 11/03/20   Lurline Del, DO  glucose blood (TRUE METRIX BLOOD GLUCOSE TEST) test strip Please use to check blood sugar up to three times daily.  07/03/20   [name], MD  insulin aspart (FIASP FLEXTOUCH) 100 UNIT/ML FlexTouch Pen Inject 4-6 Units into the skin 3 (three) times daily before meals. 11/24/20   Zenia Resides, MD  Insulin Glargine (BASAGLAR KWIKPEN) 100 UNIT/ML Inject 20 Units into the skin daily. 11/24/20   Zenia Resides, MD  levETIRAcetam (KEPPRA) 1000 MG tablet Take 1 tablet (1,000 mg total) by mouth 2 (two) times daily. 11/16/20   Meccariello, Bernita Raisin, DO  lidocaine-prilocaine (EMLA) cream Apply 1 application topically as needed (pain).    [provider]  metFORMIN (GLUCOPHAGE-XR) 500 MG 24 hr tablet Take 1 tablet (500 mg total) by mouth 2 (two) times daily with a meal. 11/22/20   Lenoria Chime, MD  mirtazapine (REMERON) 30 MG  tablet Take 1 tablet (30 mg total) by mouth at bedtime. 11/16/20   Meccariello, Bernita Raisin, DO  ondansetron (ZOFRAN) 4 MG tablet Take 1 tablet (4 mg total) by mouth 3 (three) times daily before meals. 11/22/20   Matilde Haymaker, MD  pantoprazole (PROTONIX) 40 MG tablet Take 1 tablet twice a day for 28 days followed by 1 tablet per day ongoing. Patient taking differently: Take 40 mg by mouth 2 (two) times daily. Take 1 tablet twice a day for 28 days followed by 1 tablet per day ongoing. 11/03/20   Lurline Del, DO  thiamine (VITAMIN B-1) 100 MG tablet Take 1 tablet (100 mg total) by mouth daily. 05/27/16   [name], DO    Allergies    Lisinopril and Drug ingredient [spironolactone]  Review of Systems   Review of Systems  Constitutional:       Per HPI, otherwise negative  HENT:       Per HPI, otherwise negative  Eyes: Positive for visual disturbance.  Respiratory:       Per HPI, otherwise negative  Cardiovascular:       Per HPI,  otherwise negative  Gastrointestinal: Negative for vomiting.  Endocrine:       Negative aside from HPI  Genitourinary:       Neg aside from HPI   Musculoskeletal:       Per HPI, otherwise negative  Skin: Negative.   Neurological: Negative for syncope.    Physical Exam Updated Vital Signs BP (!) 173/123   Pulse (!) 106   Resp 10   SpO2 99%   Physical Exam Vitals and nursing note reviewed.  Constitutional:      General: He is not in acute distress.    Appearance: He is well-developed.  HENT:     Head: Normocephalic and atraumatic.  Eyes:     Conjunctiva/sclera: Conjunctivae normal.  Cardiovascular:     Rate and Rhythm: Normal rate and regular rhythm.  Pulmonary:     Effort: Pulmonary effort is normal. No respiratory distress.     Breath sounds: No stridor.  Abdominal:     General: There is no distension.  Musculoskeletal:     Right lower leg: Edema present.     Left lower leg: Edema present.  Skin:    General: Skin is warm and dry.  Neurological:     General: No focal deficit present.     Mental Status: He is alert and oriented to person, place, and time.  Psychiatric:        Mood and Affect: Mood normal.      ED Results / Procedures / Treatments   Labs (all labs ordered are listed, but only abnormal results are displayed) Labs Reviewed  COMPREHENSIVE METABOLIC PANEL - Abnormal; Notable for the following components:      Result Value   Glucose, Bld 121 (*)    BUN <5 (*)    Calcium 7.6 (*)    Total Protein 6.1 (*)    Albumin 1.8 (*)    AST 105 (*)    Alkaline Phosphatase 166 (*)    All other components within normal limits  CBC WITH DIFFERENTIAL/PLATELET - Abnormal; Notable for the following components:   WBC 14.4 (*)    RBC 3.67 (*)    Hemoglobin 10.7 (*)    HCT 33.0 (*)    RDW 17.7 (*)    Neutro Abs 11.8 (*)    Abs Immature Granulocytes 0.09 (*)    All other  components within normal limits  BRAIN NATRIURETIC PEPTIDE - Abnormal; Notable for the  following components:   B Natriuretic Peptide 741.3 (*)    All other components within normal limits  URINALYSIS, ROUTINE W REFLEX MICROSCOPIC - Abnormal; Notable for the following components:   Glucose, UA >=500 (*)    All other components within normal limits  CBG MONITORING, ED - Abnormal; Notable for the following components:   Glucose-Capillary 105 (*)    All other components within normal limits  ETHANOL  CBG MONITORING, ED  CBG MONITORING, ED    EKG None  Radiology DG Chest Port 1 View  Result Date: 12/17/2020 CLINICAL DATA:  Question of CHF by report. EXAM: PORTABLE CHEST 1 VIEW COMPARISON:  November 17, 2020 FINDINGS: Image rotated to the RIGHT. Accounting for this cardiomediastinal contours with stable mild enlargement and accentuation of central pulmonary vascular structures. Lung volumes are low. Heart size accentuated by portable technique. No sign of consolidation. No effusion on frontal radiograph. No sign of pulmonary edema. Cardiac leads project over the chest. LEFT lateral eighth rib fracture with mild displacement. No visible pneumothorax. IMPRESSION: Low lung volumes. No acute cardiopulmonary disease. Mildly displaced LEFT lateral rib fracture. Correlate with symptoms. Electronically Signed   By: Zetta Bills M.D.   On: 12/17/2020 17:50    Procedures Procedures   Medications Ordered in ED Medications  furosemide (LASIX) injection 20 mg (20 mg Intravenous Given 12/17/20 2032)    ED Course  I have reviewed the triage vital signs and the nursing notes.  Pertinent labs & imaging results that were available during my care of the patient were reviewed by me and considered in my medical decision making (see chart for details).   Update:, Patient in no distress, no increased work of breathing. Labs reviewed, discussed.  He has been monitored several hours, has had no hypoglycemia, no similar episodes to that he reports experiencing at home. Patient is found to have  mild leukocytosis, but notably has elevated BNP, there are some suspicion for anasarca secondary to his recent hospitalization with fluid resuscitation. No evidence for decompensated process per Patient amenable to starting diuretics here, following up with primary care. Without evidence for concurrent pneumonia, bacteremia, sepsis, though does have mild leukocytosis, as above. Patient discharged in stable condition.   MDM Rules/Calculators/A&P MDM Number of Diagnoses or Management Options Anasarca: new, needed workup Hypoglycemia: new, needed workup   Amount and/or Complexity of Data Reviewed Clinical lab tests: reviewed Tests in the radiology section of CPT: reviewed Tests in the medicine section of CPT: reviewed Decide to obtain previous medical records or to obtain history from someone other than the patient: yes Obtain history from someone other than the patient: yes Review and summarize past medical records: yes Independent visualization of images, tracings, or specimens: yes  Risk of Complications, Morbidity, and/or Mortality Presenting problems: high Diagnostic procedures: high Management options: high  Critical Care Total time providing critical care: < 30 minutes  Patient Progress Patient progress: stable  Final Clinical Impression(s) / ED Diagnoses Final diagnoses:  Hypoglycemia  Anasarca    Rx / DC Orders ED Discharge Orders         Ordered    furosemide (LASIX) 20 MG tablet  Daily        12/17/20 2207           Carmin Muskrat, MD 12/17/20 2210

## 2020-12-17 NOTE — ED Notes (Signed)
Gave patient urinal for u/a. States he will try to give sample.

## 2020-12-17 NOTE — ED Triage Notes (Signed)
Patient arrives via EMS from home.  Patient states that he started to become diaphoretic at home with burry vision and slurred speech.  He was able to crawl on the floor and alert his daughter to get him some juice.  EMS states that the first blood sugar was 64.  EMS stated they rechecked blood sugar and it was 75.

## 2020-12-21 ENCOUNTER — Encounter: Payer: Self-pay | Admitting: Family Medicine

## 2020-12-21 ENCOUNTER — Other Ambulatory Visit: Payer: Self-pay

## 2020-12-21 ENCOUNTER — Ambulatory Visit (INDEPENDENT_AMBULATORY_CARE_PROVIDER_SITE_OTHER): Payer: 59 | Admitting: Family Medicine

## 2020-12-21 VITALS — BP 95/48 | HR 106 | Ht 70.0 in | Wt 254.0 lb

## 2020-12-21 DIAGNOSIS — N39498 Other specified urinary incontinence: Secondary | ICD-10-CM | POA: Diagnosis not present

## 2020-12-21 DIAGNOSIS — E1140 Type 2 diabetes mellitus with diabetic neuropathy, unspecified: Secondary | ICD-10-CM | POA: Diagnosis not present

## 2020-12-21 DIAGNOSIS — I10 Essential (primary) hypertension: Secondary | ICD-10-CM | POA: Diagnosis not present

## 2020-12-21 DIAGNOSIS — R159 Full incontinence of feces: Secondary | ICD-10-CM

## 2020-12-21 DIAGNOSIS — R32 Unspecified urinary incontinence: Secondary | ICD-10-CM | POA: Diagnosis not present

## 2020-12-21 DIAGNOSIS — Z794 Long term (current) use of insulin: Secondary | ICD-10-CM

## 2020-12-21 MED ORDER — BASAGLAR KWIKPEN 100 UNIT/ML ~~LOC~~ SOPN: 12.0000 [IU] | PEN_INJECTOR | Freq: Every day | SUBCUTANEOUS | 0 refills | Status: DC

## 2020-12-21 NOTE — Patient Instructions (Addendum)
It was great seeing you today.  For your glucose concerns I want to decrease your Lantus dose to 12 units daily and discontinue your mealtime coverage.  I would also like to discontinue your Wilder Glade because that can lower your blood pressure.  We will continue the Lasix at 20 mg daily.  Regarding your incontinence issues you to get an MRI and I am checking your Keppra level to make sure you are not having seizures at night.  I have sent a prescription for depends.  I need to see you in the clinic next Wednesday, I want you to keep a log of your blood sugar so we can have an idea of what to do with the medications.  If you have any hypoglycemia or extreme hyperglycemia please go to the emergency department.  If you have any questions or concerns please have her call clinic.  I hope you have a wonderful afternoon!

## 2020-12-21 NOTE — Progress Notes (Signed)
SUBJECTIVE:   CHIEF COMPLAINT / HPI:   Hospital f/u  Diabetes Patient reports that he is prescribed 20 units of Lantus daily as well as 8 to 10 units of mealtime coverage but he is decreased his long-acting insulin as well as his mealtime coverage to 16 units of Lantus daily and 4 to 6 units of mealtime coverage because of poor appetite.  His blood sugars have still been low with fasting blood sugars in the 70s and the lowest being 64 which took him to the hospital.  He feels like this is all due to his appetite being decreased.  Blood pressure control Patient reports that he is not taking any of his blood pressure medications except for the Lasix which was restarted after he went to the hospital last Saturday.  He reports that he had extreme swelling in his lower extremities and I gave him a dose of IV Lasix and he has been peeing a lot since then.  He restarted taking 20 mg p.o. Lasix daily and his diuresis has continued.  Reports he feels fatigued all the time but denies any dizziness upon standing or syncope.  Incontinence Patient reports that since January 3 he has had incontinence every night.  He reports that he feels in sensation to urinate and defecate while awake and can hold it and go to the bathroom and is not incontinent but at night when he is sleeping he is having bowel and bladder incontinence.  He is requesting DME supplies for depends.  He is not sure why this is happening.  OBJECTIVE:   BP (!) 95/48   Pulse (!) 106   Ht 5\' 10"  (1.778 m)   Wt 254 lb (115.2 kg)   SpO2 97%   BMI 36.45 kg/m   General: Chronically ill-appearing 55 year old male in good spirits HEENT: Dry mucous membranes, no cracking around his lips Cardiac: Regular rate on auscultation but in the 90s, regular rhythm, orthostatic vital signs collected and patient's blood pressure remained stable but he does have a 20 beat increase upon standing. Respiratory: Normal work of breathing, lungs are clear to  auscultation with no crackles in his lung bases Abdomen: Soft, nontender, positive bowel sounds Extremities: Trace lower extremity edema, considerably improved from previous evaluations  ASSESSMENT/PLAN:   Essential hypertension Patient's blood pressures are low today in the 90s/40s.  Patient used to be on losartan metoprolol and Farxiga as well as Lasix.  Patient has only been taking the Lasix and that had been held until Saturday of last week.  Patient appears volume down on exam.  Positive orthostatic vital signs with an increase in his heart rate. -We will continue the Lasix 20 mg daily -Discontinue Farxiga at this time -Signs and symptoms of hypotension discussed with the patient and we will follow-up next week for repeat blood pressure check as well as further discussion on his blood sugars -Strict ED precautions given and patient is agreeable to this  Type 2 diabetes mellitus with diabetic neuropathy, with long-term current use of insulin ([place]) Patient reports his blood sugars have continued to be low even though he has decreased his long-acting as well as short-acting insulin.  Prior to this visit he had decreased his long-acting to 16 units of Lantus daily and his short acting to 4 to 6 units of mealtime coverage.  We are going to further decrease his long-acting Lantus to 12 units daily and patient is going to discontinue his mealtime coverage but monitor his blood sugars  to determine if he needs to restart these.  We are also discontinuing his [place].  This is likely due to poor p.o. intake. -Lantus 12 units daily -Discontinue mealtime coverage and Farxiga -Monitor blood sugars with fasting as well as 2-hour postprandials -We will follow-up with patient next Wednesday to discuss any adjustments needed for his diabetes management -Patient also scheduled for clinic on the 30th so that we can discuss his chronic issues at length -Strict ED and return precautions  given  Incontinence Patient reports urinary and fecal incontinence only at night since October 09, 2020.  He reports that he feels the urge and can hold it and make it to the restroom during the day.  Through chart review patient was experiencing incontinence in the past related to seizure activity.  Physical exam today was reassuring. -We will check a Keppra level to make sure he is therapeutic and make seizures a less likely culprit -Lumbar MRI ordered to assess for cauda equina syndrome although this is less likely -DME order placed for incontinence supplies -We will follow-up on Wednesday to see how he is doing. -Strict ED precautions given     Jeffrey Shave, MD [name]

## 2020-12-23 DIAGNOSIS — R32 Unspecified urinary incontinence: Secondary | ICD-10-CM | POA: Insufficient documentation

## 2020-12-23 NOTE — Assessment & Plan Note (Signed)
Patient reports urinary and fecal incontinence only at night since October 09, 2020.  He reports that he feels the urge and can hold it and make it to the restroom during the day.  Through chart review patient was experiencing incontinence in the past related to seizure activity.  Physical exam today was reassuring. -We will check a Keppra level to make sure he is therapeutic and make seizures a less likely culprit -Lumbar MRI ordered to assess for cauda equina syndrome although this is less likely -DME order placed for incontinence supplies -We will follow-up on Wednesday to see how he is doing. -Strict ED precautions given

## 2020-12-23 NOTE — Assessment & Plan Note (Signed)
Patient's blood pressures are low today in the 90s/40s.  Patient used to be on losartan metoprolol and Farxiga as well as Lasix.  Patient has only been taking the Lasix and that had been held until Saturday of last week.  Patient appears volume down on exam.  Positive orthostatic vital signs with an increase in his heart rate. -We will continue the Lasix 20 mg daily -Discontinue Farxiga at this time -Signs and symptoms of hypotension discussed with the patient and we will follow-up next week for repeat blood pressure check as well as further discussion on his blood sugars -Strict ED precautions given and patient is agreeable to this

## 2020-12-23 NOTE — Assessment & Plan Note (Signed)
Patient reports his blood sugars have continued to be low even though he has decreased his long-acting as well as short-acting insulin.  Prior to this visit he had decreased his long-acting to 16 units of Lantus daily and his short acting to 4 to 6 units of mealtime coverage.  We are going to further decrease his long-acting Lantus to 12 units daily and patient is going to discontinue his mealtime coverage but monitor his blood sugars to determine if he needs to restart these.  We are also discontinuing his [place].  This is likely due to poor p.o. intake. -Lantus 12 units daily -Discontinue mealtime coverage and Farxiga -Monitor blood sugars with fasting as well as 2-hour postprandials -We will follow-up with patient next Wednesday to discuss any adjustments needed for his diabetes management -Patient also scheduled for clinic on the 30th so that we can discuss his chronic issues at length -Strict ED and return precautions given

## 2020-12-27 ENCOUNTER — Ambulatory Visit (INDEPENDENT_AMBULATORY_CARE_PROVIDER_SITE_OTHER): Payer: 59 | Admitting: Family Medicine

## 2020-12-27 ENCOUNTER — Other Ambulatory Visit: Payer: Self-pay

## 2020-12-27 ENCOUNTER — Encounter: Payer: Self-pay | Admitting: Family Medicine

## 2020-12-27 DIAGNOSIS — Z794 Long term (current) use of insulin: Secondary | ICD-10-CM

## 2020-12-27 DIAGNOSIS — I10 Essential (primary) hypertension: Secondary | ICD-10-CM

## 2020-12-27 DIAGNOSIS — E1140 Type 2 diabetes mellitus with diabetic neuropathy, unspecified: Secondary | ICD-10-CM

## 2020-12-27 LAB — LEVETIRACETAM LEVEL: Levetiracetam Lvl: 58.7 ug/mL — ABNORMAL HIGH (ref 10.0–40.0)

## 2020-12-27 MED ORDER — FUROSEMIDE 20 MG PO TABS: 40.0000 mg | ORAL_TABLET | Freq: Every day | ORAL | 0 refills | Status: DC

## 2020-12-27 NOTE — Patient Instructions (Signed)
It was wonderful seeing you today.  Your blood pressure is greatly improved from previous checks and your blood sugars seem to have been doing better although we do not have your blood glucose monitor.  I want to increase your Lasix to 40 mg daily because you seem to be retaining fluids.  If you have worsening shortness of breath, you do not start peeing please let me know and we can make further changes.  Regarding your blood sugars I want to continue on the current regimen.  You are in my specialty clinic next week and I want you to bring all of your medications and your blood glucose monitor.  We will take an in-depth dive into your medical history and try to come up with the best plan for management of your chronic conditions.  If you have one of your episodes of shaking please be evaluated in the emergency department.  If you have any questions or concerns please let me know.  I will see you next week.

## 2020-12-27 NOTE — Progress Notes (Signed)
    SUBJECTIVE:   CHIEF COMPLAINT / HPI:   Diabetes  Patient reports that since making his changes to his diabetes regimen his glucoses have run between 107 and 178.  He has not had any hypoglycemic events.  He reports he has his log of blood sugars but he left home and will bring it to the next visit.  Is currently taking 12 units of Lantus daily and we discontinued his [place] as well as mealtime coverage.  He reports continued issues with urinary incontinence.  HTN  Patient reports that since he was last seen his blood pressures have been higher.  Denies any headaches, blurry vision.  Reports that the day after he was seen he had an episode of shaking followed by syncope.  Patient reports that he has not had issues with that since that time but it was still concerning.  I instructed him that he needs to go to the hospital if he has episodes like this.  OBJECTIVE:   BP (!) 141/93   Pulse (!) 104   Ht 5\' 10"  (1.778 m)   Wt 121.6 kg   SpO2 100%   BMI 38.45 kg/m   General: Chronically ill-appearing 55 year old male in no acute distress Cardiac: Regular rate and rhythm, no murmur appreciated Respiratory: Normal work of breathing, lungs clear to auscultation bilaterally Abdomen: Soft, nontender, positive bowel sounds Extremities: Patient has pitting edema to his thighs.  ASSESSMENT/PLAN:   Type 2 diabetes mellitus with diabetic neuropathy, with long-term current use of insulin (HCC) Blood sugars have been better controlled since adjusting his regimen.  He reports the lowest blood sugar of 107 and the highest of 178.  We will continue this regimen at this time will of Lantus 12 units daily and continue holding the mealtime coverage and [place].  He is going to keep a log of his blood sugars and bring his meter in with him next week when I see him for our clinic visit.  Essential hypertension Patient's blood pressure mildly elevated today.  He reports that his blood pressures have been  high normal since adjusting his medications.  He is currently taking Lasix 20 mg daily but reports he has not been urinating much.  He reports that he responded very well to the 20 mg of IV Lasix.  Physical exam today shows pitting edema to his thighs.  Increased Lasix to 40 mg daily and gave patient strict ED precautions as well as return precautions.  We will monitor his fluid status at the next visit and determine the next steps in management.     [name], MD [place]

## 2020-12-29 NOTE — Assessment & Plan Note (Signed)
Patient's blood pressure mildly elevated today.  He reports that his blood pressures have been high normal since adjusting his medications.  He is currently taking Lasix 20 mg daily but reports he has not been urinating much.  He reports that he responded very well to the 20 mg of IV Lasix.  Physical exam today shows pitting edema to his thighs.  Increased Lasix to 40 mg daily and gave patient strict ED precautions as well as return precautions.  We will monitor his fluid status at the next visit and determine the next steps in management.

## 2020-12-29 NOTE — Assessment & Plan Note (Signed)
Blood sugars have been better controlled since adjusting his regimen.  He reports the lowest blood sugar of 107 and the highest of 178.  We will continue this regimen at this time will of Lantus 12 units daily and continue holding the mealtime coverage and [place].  He is going to keep a log of his blood sugars and bring his meter in with him next week when I see him for our clinic visit.

## 2021-01-04 ENCOUNTER — Other Ambulatory Visit: Payer: Self-pay

## 2021-01-04 ENCOUNTER — Ambulatory Visit (INDEPENDENT_AMBULATORY_CARE_PROVIDER_SITE_OTHER): Payer: 59 | Admitting: Family Medicine

## 2021-01-04 ENCOUNTER — Encounter: Payer: Self-pay | Admitting: Family Medicine

## 2021-01-04 VITALS — BP 120/82 | HR 103 | Ht 70.0 in | Wt 264.2 lb

## 2021-01-04 DIAGNOSIS — Z7189 Other specified counseling: Secondary | ICD-10-CM

## 2021-01-04 DIAGNOSIS — F1010 Alcohol abuse, uncomplicated: Secondary | ICD-10-CM

## 2021-01-04 DIAGNOSIS — I10 Essential (primary) hypertension: Secondary | ICD-10-CM | POA: Diagnosis not present

## 2021-01-04 MED ORDER — FUROSEMIDE 20 MG PO TABS: 20.0000 mg | ORAL_TABLET | Freq: Every day | ORAL | 0 refills | Status: DC

## 2021-01-04 NOTE — Progress Notes (Signed)
SUBJECTIVE:   CHIEF COMPLAINT / HPI:   [facility] clinic   Depression The patient reports that his wife passed away last week.  She had cancer and he found her bleeding.  He called EMS but she did not survive.  They are the caregivers for their 3 special needs children.  His only other support person is his sister-in-law but he does not feel like he can talk with her.  He is interested in seeing a counselor.  He denies any thoughts of self-harm but reports he is very down and depressed.  Alcoholism Patient reports that he is continuing to not drink like he used to.  He may drink a sixpack in a week where he used to drink a case of beer or 1/5 of liquor a night.  He does have history of cirrhosis and so we discussed complete cessation of alcohol and patient reports he will try to do this and he thinks that he can do this.  He is not interested in [employer].  He is currently taking naltrexone and reports he is taking it daily.  Blood pressure concerns Patient reports that he is still not taking any of his blood pressure medications but has started taking his Lasix and increased it to 2 pills daily.  He brought all of his medications to clinic today and we went through the medications he has and there is no Lasix present.  Patient holds up a bottle and says that this is the Lasix but it is actually his losartan.  He reports that he has not been taking losartan accidentally instead of the Lasix which would explain why he is not urinating well.  He reports his blood pressures have been well controlled although he has had a few hypotensive episodes especially in the mornings with systolic numbers in the 37C.  Disability questions The patient is a Dealer on heavy machinery.  Due to his health concerns he has not been able to work since September.  He does not feel safe to go back to work and his work is placed him on medical leave.  He would like to have information regarding disability but he is  unsure of the process of how to do this.  OBJECTIVE:   BP 120/82   Pulse (!) 103   Ht 5\' 10"  (1.778 m)   Wt 264 lb 4 oz (119.9 kg)   SpO2 99%   BMI 37.92 kg/m   General: Chronically ill-appearing 55 year old male Respiratory: Normal work of breathing MSK: Able to ambulate with out walker Extremities: Patient has lower extremity pitting edema to his knees.  ASSESSMENT/PLAN:   Essential hypertension Patient's blood pressure 120/82 today.  He thought that he has been taking an increased dose of Lasix since last seeing me but he has not because he had his Lasix and losartan confused.  He has had hypotension in the mornings.  He will discontinue the losartan today and initiate Lasix 20 mg daily and monitor his urine output.  He is also going to restart his Klor-Con since he is restarting the Lasix.  We will recheck a BMP at his next visit and he will monitor his blood pressures.  Strict return precautions given.  ETOH abuse Patient reports that his alcohol consumption is down to at most a sixpack a week.  He is motivated to discontinue alcohol completely and is going to continue to work on it each day.  He is still on naloxone which he believes helps.  Provided support and resources if needed.  Grief counseling Patient's wife passed away on 01-26-2021.  He is the primary provider for his 3 special needs children who are all in their 61s.  His only social support is his sister-in-law who is keeping his daughters.  He would like to speak to a counselor.  Provided counseling resources.  Return precautions given and patient is agreeable to this.    Concerns regarding navigation of the healthcare system Patient does not feel that he is able to work given his chronic medical conditions at this time and I agree.  He is interested in applying for disability but is not sure of how to do this.  A referral has been placed to chronic care management to help assist him with this process.  [name], MD [place]

## 2021-01-04 NOTE — Assessment & Plan Note (Signed)
Patient's blood pressure 120/82 today.  He thought that he has been taking an increased dose of Lasix since last seeing me but he has not because he had his Lasix and losartan confused.  He has had hypotension in the mornings.  He will discontinue the losartan today and initiate Lasix 20 mg daily and monitor his urine output.  He is also going to restart his Klor-Con since he is restarting the Lasix.  We will recheck a BMP at his next visit and he will monitor his blood pressures.  Strict return precautions given.

## 2021-01-04 NOTE — Assessment & Plan Note (Signed)
Patient reports that his alcohol consumption is down to at most a sixpack a week.  He is motivated to discontinue alcohol completely and is going to continue to work on it each day.  He is still on naloxone which he believes helps.  Provided support and resources if needed.

## 2021-01-04 NOTE — Patient Instructions (Addendum)
It was great seeing you today.  I am so sorry to hear about your wife.  I am providing a list of counselors and you can contact one of them to schedule an appointment to speak with someone.  Regarding your work concerns I am putting a referral in for our case manager to contact you.  Regarding your blood pressure medications I have resent the prescription for the 20 mg Lasix.  Please take 1 tablet daily.  You will also need to restart taking your potassium if you are taking the Lasix.  I would like for you to follow-up in 2 weeks.    Therapy and Counseling Resources Most providers on this list will take Medicaid. Patients with commercial insurance or Medicare should contact their insurance company to get a list of in network providers.  BestDay:Psychiatry and Counseling [address] [facility] [place]. [place], [place] [address] [place]  [address] [place], [place] [address]      [place] [address]  [place], [place] [address] [phone]  [place] [address] [place]  [place], [place] [address]       [phone]      Jinny Blossom [facility] [address], [place], [place]  Family Solutions:  Center. [place] [phone]  Journeys Counseling:  [place] [address] [place] [phone]  [facility] (under & uninsured) [address]   [place] [phone]    [email or web address]    [place] [address] . [place]    [phone]  [facility] [place]     Phone:  [phone]     [place]  [place] [address]      [place], [place] [address] [facility]: [place], [place], [place]   [place]  ([place] therapist) [email or web address]  [place] [address]   [place] [address]    [phone]    [facility]   [address],  [place], [place]   [place]  [place]  [facility]  [address] [place], [place]        [phone]  Open Access/Walk In Clinic under & uninsured  [facility]  [address] [place], [place] Crisis [phone]  [facility] the [place],  (Jane Lew)   [place], [place]: [phone]) 8:30 - 12; 1 - 2:30  [facility] of the [place],  [place], [place]    ([phone]):8:30 - 12; 2 - 3PM  [facility] [employer],  [address],  [place]; [phone]):   Mon - Fri 8 AM - 5 PM  [facility] [place]  MWF 12:30 to 3:00 or call to schedule an appointment  [phone]  Specific Provider options Psychology Today  [email or web address] 1. click on find a therapist  2. enter your zip code 3. left side and select or tailor a therapist for your specific need.   [facility] Provider Directory [email or web address]  (Medicaid)   Follow all drop down to find a provider  Social  [place]) [phone] or [email or web address] [address], [place], [place] Recovery support and educational   24- Hour Availability:  .  [name] [facility]  . [place], [place] Crisis [phone]  . Family Service of the [employer] [phone]  [facility] Crisis Service  [phone]   . [place]  [phone] (after hours)  . Therapeutic Alternative/Mobile Crisis   [phone]  . [place] National Suicide Hotline  (309)595-8514 ([place])  . Call 911 or  go to emergency room  . [employer]  [phone]);  [place] and [employer]   . [facility] ACCESS  [phone]); [place], [place], [place], [place], [place], [place], [place]

## 2021-01-04 NOTE — Assessment & Plan Note (Signed)
Patient's wife passed away on 2021-01-04.  He is the primary provider for his 3 special needs children who are all in their 70s.  His only social support is his sister-in-law who is keeping his daughters.  He would like to speak to a counselor.  Provided counseling resources.  Return precautions given and patient is agreeable to this.

## 2021-01-05 NOTE — Addendum Note (Signed)
Addended by: Concepcion Living on: 01/05/2021 11:16 AM   Modules accepted: Orders

## 2021-01-07 ENCOUNTER — Other Ambulatory Visit: Payer: Self-pay

## 2021-01-07 ENCOUNTER — Ambulatory Visit
Admission: RE | Admit: 2021-01-07 | Discharge: 2021-01-07 | Disposition: A | Discharge status: Home / Self Care | Payer: 59 | Source: Ambulatory Visit | Attending: Family Medicine | Admitting: Family Medicine

## 2021-01-07 DIAGNOSIS — R32 Unspecified urinary incontinence: Secondary | ICD-10-CM

## 2021-01-07 DIAGNOSIS — R159 Full incontinence of feces: Secondary | ICD-10-CM

## 2021-01-12 ENCOUNTER — Telehealth: Payer: Self-pay

## 2021-01-12 NOTE — Telephone Encounter (Signed)
   Telephone encounter was:  Successful.  01/12/2021 Name: Jeffrey [name] MRN: [id number] DOB: Sep 27, 1966  Jeffrey [name] is a 55 y.o. year old male who is a primary care patient of [name], MD . The community resource team was consulted for assistance with disability information  Care guide performed the following interventions: Patient provided with information about care guide support team and interviewed to confirm resource needs Spoke with patient verified email I will email disability resources to patient at [email or web address] next week.  Follow Up Plan:  Care guide will follow up with patient by phone over the next 7 days  Gerren Hoffmeier, AAS Paralegal, [place] . Embedded Care Coordination [facility] Health  Care Management  [address], [place] [address] ??[email or web address]  ?? [phone]   [email or web address]

## 2021-01-15 ENCOUNTER — Telehealth: Payer: Self-pay

## 2021-01-15 NOTE — Telephone Encounter (Signed)
   Telephone encounter was:  Unsuccessful.  01/15/2021 Name: Jeffrey [name] MRN: [id number] DOB: 1966/06/03  Unsuccessful outbound call made today to assist with:  Left message on voicemail to check if patient has received emailed resources for filing disability.  Outreach Attempt:  2nd Attempt  A HIPAA compliant voice message was left requesting a return call.  Instructed patient to call back at [phone].  Kierstin January, AAS Paralegal, [place] . Embedded Care Coordination [facility] Health  Care Management  [address], [place] [address] ??[email or web address]  ?? [phone]   [email or web address]

## 2021-01-16 ENCOUNTER — Other Ambulatory Visit: Payer: Self-pay | Admitting: Gastroenterology

## 2021-01-16 ENCOUNTER — Other Ambulatory Visit: Payer: Self-pay

## 2021-01-16 ENCOUNTER — Ambulatory Visit (AMBULATORY_SURGERY_CENTER): Payer: Self-pay | Admitting: *Deleted

## 2021-01-16 ENCOUNTER — Telehealth: Payer: Self-pay | Admitting: *Deleted

## 2021-01-16 VITALS — Ht 70.0 in | Wt 262.0 lb

## 2021-01-16 DIAGNOSIS — Z1211 Encounter for screening for malignant neoplasm of colon: Secondary | ICD-10-CM

## 2021-01-16 MED ORDER — NA SULFATE-K SULFATE-MG SULF 17.5-3.13-1.6 GM/177ML PO SOLN: 1.0000 | Freq: Once | ORAL | 0 refills | Status: AC

## 2021-01-16 NOTE — Telephone Encounter (Signed)
Patient with history of seizures , most recent seizure activity ( unwitnessed ) last week.  Please advise if he is appropriate for [facility]. Thank you, Raquel Sarna, RN

## 2021-01-16 NOTE — Progress Notes (Signed)
No egg or soy allergy known to patient  No issues with past sedation with any surgeries or procedures Patient denies ever being told they had issues or difficulty with intubation  No FH of Malignant Hyperthermia No diet pills per patient No home 02 use per patient  No blood thinners per patient  Pt denies issues with constipation  No A fib or A flutter  EMMI video to pt or via [place] 19 guidelines implemented in PV today with Pt and RN  Pt is fully vaccinated  for Covid   Coupon given to pt in PV today , Code to Pharmacy and  NO PA's for preps discussed with pt In PV today  Discussed with pt there will be an out-of-pocket cost for prep and that varies from $0 to 70 dollars   Due to the COVID-19 pandemic we are asking patients to follow certain guidelines.  Pt aware of COVID protocols and [facility] guidelines

## 2021-01-17 ENCOUNTER — Encounter: Payer: Self-pay | Admitting: Family Medicine

## 2021-01-17 ENCOUNTER — Ambulatory Visit (INDEPENDENT_AMBULATORY_CARE_PROVIDER_SITE_OTHER): Payer: 59 | Admitting: Family Medicine

## 2021-01-17 DIAGNOSIS — I10 Essential (primary) hypertension: Secondary | ICD-10-CM

## 2021-01-17 DIAGNOSIS — F10129 Alcohol abuse with intoxication, unspecified: Secondary | ICD-10-CM | POA: Diagnosis not present

## 2021-01-17 DIAGNOSIS — R531 Weakness: Secondary | ICD-10-CM | POA: Diagnosis not present

## 2021-01-17 DIAGNOSIS — I950 Idiopathic hypotension: Secondary | ICD-10-CM | POA: Diagnosis not present

## 2021-01-17 DIAGNOSIS — Z7189 Other specified counseling: Secondary | ICD-10-CM

## 2021-01-17 MED ORDER — FUROSEMIDE 40 MG PO TABS: 40.0000 mg | ORAL_TABLET | Freq: Every day | ORAL | 2 refills | Status: DC

## 2021-01-17 MED ORDER — GABAPENTIN 300 MG PO CAPS: ORAL_CAPSULE | ORAL | 0 refills | Status: DC

## 2021-01-17 MED ORDER — METFORMIN HCL ER 500 MG PO TB24: 500.0000 mg | ORAL_TABLET | Freq: Two times a day (BID) | ORAL | 2 refills | Status: DC

## 2021-01-17 MED ORDER — PANTOPRAZOLE SODIUM 40 MG PO TBEC: 40.0000 mg | DELAYED_RELEASE_TABLET | Freq: Every day | ORAL | 1 refills | Status: DC

## 2021-01-17 MED ORDER — NALTREXONE HCL 50 MG PO TABS: 50.0000 mg | ORAL_TABLET | Freq: Every day | ORAL | 3 refills | Status: DC

## 2021-01-17 NOTE — Telephone Encounter (Signed)
This man is actually a patient of Dr. Fuller Plan from prior inpatient consults.   Please let the patient know that appointment should be scheduled with Dr. Fuller Plan.  - HD

## 2021-01-17 NOTE — Assessment & Plan Note (Signed)
Improved by patient report.  Continue gabapentin and naltrexone.  See frequently

## 2021-01-17 NOTE — Progress Notes (Signed)
    SUBJECTIVE:   CHIEF COMPLAINT / HPI:   Here to follow up on edema and medications   GRIEF Wife died recently .  He attended the funeral.  Jeffrey [name] he is doing "ok".  Did contact a Social worker.  HYPERTENSION  EDEMA Did not have any result with one 20 mg lasix.  Increased to 40 mg and has been urinating well.  Feels his edema is lessening.  Has lost 7 lbs since last visit  ALCOHOL Reports has not had a drink of alcohol or beer since last visit with Dr Caron Presume.  Does not really have cravings.  Taking gabapentin regularly and also has empty bottle of naltrexone that he reports taking daily just ran out yesterday  Jeffrey [name] feels his lower extremities are weak and still having episodes of incontinence. MRI of spine last week did not show significant stenosis.  Asks about incontinence supplies    PERTINENT  PMH / PSH: did contact SS to apply for disability   OBJECTIVE:   BP 125/70   Pulse (!) 112   Ht 5\' 10"  (1.778 m)   Wt 255 lb 6.4 oz (115.8 kg)   SpO2 97%   BMI 36.65 kg/m   Await oriented conversant Walking on his own  Reviewed all his medications  ASSESSMENT/PLAN:   Hypotension Improved off all blood pressure medications   Alcohol abuse with intoxication ([place]) Improved by patient report.  Continue gabapentin and naltrexone.  See frequently   Grief counseling Seems to be coping after recent death of spouse.  Frequent follow up   Weakness Uncertain of cause of weakness and incontinence.  Could be due to chronic alcohol intake but should be slowly improving.  Incontinence is puzzling.  Recent lumbar MRI unrevealing.  Will check CK.  May need neurology referral if persists.  Also rectal exam at subsequent visit.     Jeffrey Covert, MD [place]

## 2021-01-17 NOTE — Patient Instructions (Signed)
Good to see you today - Thank you for coming in  Things we discussed today:  Great accomplishment stopping drinking  Continue medications as you are  I will check on the Physical Therapy referral and for the Depends  I will call you if your tests are not good.  Otherwise, I will send you a message on MyChart (if it is active) or a letter in the mail..  If you do not hear from me with in 2 weeks please call our office.      Please always bring your medication bottles  Come back to see Dr Caron Presume 2-4 weeks

## 2021-01-17 NOTE — Telephone Encounter (Signed)
Jeffrey [name],  With this pt's extensive medical hx and his recent seizure activity, he should have an OV and his elective procedure done at the hospital.  Thanks,  [name]

## 2021-01-17 NOTE — Assessment & Plan Note (Addendum)
Improved off all blood pressure medications.  His edema is improving. Continue lasix 40 mg daily. Check labs

## 2021-01-17 NOTE — Assessment & Plan Note (Addendum)
Uncertain of cause of weakness and incontinence.  Could be due to chronic alcohol intake but should be slowly improving.  Incontinence is puzzling.  Recent lumbar MRI unrevealing.  Will check CK.  May need neurology referral if persists.  Also rectal exam at subsequent visit.  He is interested in Physical Therapy

## 2021-01-17 NOTE — Assessment & Plan Note (Signed)
Seems to be coping after recent death of spouse.  Frequent follow up

## 2021-01-17 NOTE — Telephone Encounter (Signed)
Left message for patient to call and schedule new patient  appointment with Dr Loletha Carrow. Per anesthesia patient needs to have an office visit before he can be scheduled at [facility] for colonoscopy.

## 2021-01-18 LAB — CMP14+EGFR
ALT: 81 IU/L — ABNORMAL HIGH (ref 0–44)
AST: 203 IU/L — ABNORMAL HIGH (ref 0–40)
Albumin/Globulin Ratio: 0.6 — ABNORMAL LOW (ref 1.2–2.2)
Albumin: 1.9 g/dL — CL (ref 3.8–4.9)
Alkaline Phosphatase: 182 IU/L — ABNORMAL HIGH (ref 44–121)
BUN/Creatinine Ratio: 9 (ref 9–20)
BUN: 8 mg/dL (ref 6–24)
Bilirubin Total: 0.4 mg/dL (ref 0.0–1.2)
CO2: 24 mmol/L (ref 20–29)
Calcium: 7.2 mg/dL — ABNORMAL LOW (ref 8.7–10.2)
Chloride: 100 mmol/L (ref 96–106)
Creatinine, Ser: 0.86 mg/dL (ref 0.76–1.27)
Globulin, Total: 3.2 g/dL (ref 1.5–4.5)
Glucose: 292 mg/dL — ABNORMAL HIGH (ref 65–99)
Potassium: 4.3 mmol/L (ref 3.5–5.2)
Sodium: 137 mmol/L (ref 134–144)
Total Protein: 5.1 g/dL — ABNORMAL LOW (ref 6.0–8.5)
eGFR: 102 mL/min/{1.73_m2} (ref >59)

## 2021-01-18 LAB — CK: Total CK: 5666 U/L (ref 41–331)

## 2021-01-22 ENCOUNTER — Telehealth: Payer: Self-pay

## 2021-01-22 NOTE — Telephone Encounter (Signed)
   Telephone encounter was:  Successful.  01/22/2021 Name: Jeffrey [name] MRN: [id number] DOB: 11/19/1965  Jeffrey [name] is a 55 y.o. year old male who is a primary care patient of [name], MD . The community resource team was consulted for assistance with disability information  Care guide performed the following interventions: Follow up call placed to the patient to discuss status of referral Spoke with patient he has received the emailed information for filing disability and plans on calling in the morning to make an appointment at the [employer] office.  No further assistance is needed at this time.   .  Follow Up Plan:  No further follow up planned at this time. The patient has been provided with needed resources.  Joely Losier, AAS Paralegal, [place] . Embedded Care Coordination [facility]  [facility] Management  [address], [place] [address] [email or web address]  ?? [phone]   [email or web address]

## 2021-01-25 ENCOUNTER — Telehealth: Payer: Self-pay | Admitting: Family Medicine

## 2021-01-25 DIAGNOSIS — R748 Abnormal levels of other serum enzymes: Secondary | ICD-10-CM

## 2021-01-25 DIAGNOSIS — R531 Weakness: Secondary | ICD-10-CM

## 2021-01-25 NOTE — Telephone Encounter (Signed)
Overall feeling ok.  No drinking, no falls  Does report 2 seizures in last week.  Thinks has appointment with his neurologist but not till June.  He agreed to call them and see if can be seen sooner perhaps be on a call list  Thought he had an appointment with Dr Caron Presume next week but I do not see in the computer.  Asked him to call and make an appointment ASAP.  Will look into see if can order depends

## 2021-01-25 NOTE — Assessment & Plan Note (Signed)
CK is elevated.  Called patient and recommend follow up with his neurologist ASAP for further evaluation of lower extremity and elevated CK

## 2021-01-25 NOTE — Assessment & Plan Note (Signed)
Has mildly worsened since last CMET in March.  Perhaps residual if he has truly stopped drinking.  He does not have any current abdomen pain or nausea and vomiting.  Would recheck in a few weeks.  Will need further work up and review of his medications if continues to worsen and is abstinent

## 2021-01-29 NOTE — Telephone Encounter (Signed)
Attempted patient at both phone numbers, messages left on both house phone and cell phone asking patient to call and schedule office visit with Dr Fuller Plan to move forward with scheduling colonoscopy at [facility].

## 2021-01-31 ENCOUNTER — Encounter: Payer: 59 | Admitting: Gastroenterology

## 2021-02-02 ENCOUNTER — Other Ambulatory Visit: Payer: Self-pay

## 2021-02-02 ENCOUNTER — Ambulatory Visit (INDEPENDENT_AMBULATORY_CARE_PROVIDER_SITE_OTHER): Payer: 59 | Admitting: Family Medicine

## 2021-02-02 ENCOUNTER — Telehealth: Payer: Self-pay

## 2021-02-02 VITALS — BP 117/81 | HR 121 | Ht 70.0 in | Wt 245.0 lb

## 2021-02-02 DIAGNOSIS — Z794 Long term (current) use of insulin: Secondary | ICD-10-CM

## 2021-02-02 DIAGNOSIS — R531 Weakness: Secondary | ICD-10-CM | POA: Diagnosis not present

## 2021-02-02 DIAGNOSIS — R739 Hyperglycemia, unspecified: Secondary | ICD-10-CM

## 2021-02-02 DIAGNOSIS — R748 Abnormal levels of other serum enzymes: Secondary | ICD-10-CM

## 2021-02-02 DIAGNOSIS — E1140 Type 2 diabetes mellitus with diabetic neuropathy, unspecified: Secondary | ICD-10-CM

## 2021-02-02 DIAGNOSIS — D649 Anemia, unspecified: Secondary | ICD-10-CM

## 2021-02-02 DIAGNOSIS — I10 Essential (primary) hypertension: Secondary | ICD-10-CM

## 2021-02-02 LAB — GLUCOSE, POCT (MANUAL RESULT ENTRY): POC Glucose: 99 mg/dl (ref 70–99)

## 2021-02-02 MED ORDER — GABAPENTIN 300 MG PO CAPS: 900.0000 mg | ORAL_CAPSULE | Freq: Three times a day (TID) | ORAL | 3 refills | Status: DC

## 2021-02-02 NOTE — Patient Instructions (Signed)
It was wonderful seeing you today.  I am sorry you are still having a rough time.  I have your disability paperwork and will work on it.  I want you to decrease your insulin to 10 units daily.  I am collecting some lab work and will call you with those results if there are any abnormalities.  I also placed a referral for physical therapy as well as placed an order for you to get a new walker.  I would like to follow-up with you in approximately 2 weeks.  If you have any questions or concerns please feel free to call the clinic.  I hope you have a wonderful afternoon!

## 2021-02-02 NOTE — Telephone Encounter (Signed)
Community message sent to [employer] for rolling walker.

## 2021-02-02 NOTE — Telephone Encounter (Signed)
Received confirmation from [employer] stating they will begin processing DME orders.

## 2021-02-02 NOTE — Progress Notes (Signed)
    SUBJECTIVE:   CHIEF COMPLAINT / HPI:   Weakness Patient reports that he is having continued weakness and is unsure of the cause.  Reports that the weakness started when he was hospitalized in February but has just continued to progress.  He also reports continued issues with urinary incontinence and he has still not gotten any DME incontinence supplies.  Reports that he is able to walk but is having to use a walker now and needs disability paperwork filled out.  Denies any acute worsening over the last week.  Reports that he is still urinating with control during the day but is just having incontinence at night.  When asked if he can get in with his neurologist earlier than July he reports that he has been placed on the patient canceled list so that he may be able to get an earlier appointment but he has not heard anything.  We discussed his statin use and the patient is still taking his statin.  I instructed him to discontinue this at this time.  Patient is agreeable to this.  Diabetes Patient is still having issues with his blood sugars.  He is currently on 12 units of insulin daily.  His lowest blood sugar recorded was 49 with his highest being in the high 300s.  He had several blood sugars in the 40s-60s range.  Reports that he can feel when his blood sugar gets low and will eat something.  He reports that the days that he had those low blood sugars were days that he skipped breakfast or lunch.  Is not on any mealtime coverage insulin.   OBJECTIVE:   BP 117/81   Pulse (!) 121   Ht $R'5\' 10"'UT$  (1.778 m)   Wt 245 lb (111.1 kg)   SpO2 99%   BMI 35.15 kg/m   General: Chronically ill-appearing 55 year old male in no acute distress at this time Cardiac: Regular rate and rhythm, no murmurs appreciated Respiratory: Normal work of breathing, lungs clear to auscultation bilaterally Abdomen: Soft, nontender, positive bowel sounds Extremities: Pitting edema in lower extremities bilaterally, stable  from previous exams MSK: Patient is able to stand and ambulate with difficulty.  He uses a walker to walk.  Proximal upper extremity motor strength is intact and 5/5.  ASSESSMENT/PLAN:   Type 2 diabetes mellitus with diabetic neuropathy, with long-term current use of insulin ([facility]) Blood sugars still have worse fluctuations.  I asked in the high 300s but multiple blood sugars between 40-60.  Currently taking 12 units long-acting insulin daily.  Given these hypoglycemic episodes I am going to decrease his long-acting insulin to 10 units daily.  He is going to track his blood sugars and he is going to have an appointment with pharmacy in 1 week.  Weakness CK even more elevated than previous evaluation.  Talked with patient regarding neurologist follow-up and the patient reports that he can not get in until June or July.  I will call the neurologist office on Monday for further recommendations.  Regarding further evaluation for lower extremity weakness and the elevated CK we are collecting lab work including an ionized calcium, CRP, ESR, TSH.  I would like to follow-up with the patient in 2 weeks for further investigation of these issues.     [name], MD [place]

## 2021-02-02 NOTE — Telephone Encounter (Signed)
Community message sent to [employer] for incontinent supplies.

## 2021-02-03 ENCOUNTER — Telehealth: Payer: Self-pay | Admitting: Family Medicine

## 2021-02-03 LAB — CBC
Hematocrit: 36.6 % — ABNORMAL LOW (ref 37.5–51.0)
Hemoglobin: 11.8 g/dL — ABNORMAL LOW (ref 13.0–17.7)
MCH: 28.9 pg (ref 26.6–33.0)
MCHC: 32.2 g/dL (ref 31.5–35.7)
MCV: 90 fL (ref 79–97)
Platelets: 449 10*3/uL (ref 150–450)
RBC: 4.08 x10E6/uL — ABNORMAL LOW (ref 4.14–5.80)
RDW: 15 % (ref 11.6–15.4)
WBC: 11.1 10*3/uL — ABNORMAL HIGH (ref 3.4–10.8)

## 2021-02-03 LAB — COMPREHENSIVE METABOLIC PANEL
ALT: 250 IU/L — ABNORMAL HIGH (ref 0–44)
AST: 250 IU/L — ABNORMAL HIGH (ref 0–40)
Albumin/Globulin Ratio: 0.5 — ABNORMAL LOW (ref 1.2–2.2)
Albumin: 2.1 g/dL — ABNORMAL LOW (ref 3.8–4.9)
Alkaline Phosphatase: 223 IU/L — ABNORMAL HIGH (ref 44–121)
BUN/Creatinine Ratio: 6 — ABNORMAL LOW (ref 9–20)
BUN: 6 mg/dL (ref 6–24)
Bilirubin Total: 0.4 mg/dL (ref 0.0–1.2)
CO2: 25 mmol/L (ref 20–29)
Calcium: 7.8 mg/dL — ABNORMAL LOW (ref 8.7–10.2)
Chloride: 96 mmol/L (ref 96–106)
Creatinine, Ser: 0.95 mg/dL (ref 0.76–1.27)
Globulin, Total: 4.3 g/dL (ref 1.5–4.5)
Glucose: 95 mg/dL (ref 65–99)
Potassium: 4.3 mmol/L (ref 3.5–5.2)
Sodium: 141 mmol/L (ref 134–144)
Total Protein: 6.4 g/dL (ref 6.0–8.5)
eGFR: 94 mL/min/{1.73_m2} (ref >59)

## 2021-02-03 LAB — CK: Total CK: 9364 U/L (ref 41–331)

## 2021-02-03 NOTE — Telephone Encounter (Signed)
**  After Hours/ Emergency Line Call**  Received a call to report that Jeffrey [name] stating he got a call from his PCP requesting he call the after hours emergency line for lab abnormalities.  Upon chart review his CK is >9,000. He denies complaints at this time. He states he has been able to urinate normally today and has not noticed dark urine or other symptoms.  I was able to reach out to the patient's PCP who states he will call the patient back and talk about next steps.  I spoke to the PCP shortly thereafter and he stated he did reach out to the patient to cover next steps.  The main item will be for the patient to withhold atorvastatin until given the all clear to restart it, which also reiterated with the patient.  He will follow-up with the patient again early next week as well.  Lurline Del, DO PGY-2, [facility] 02/03/2021 1:41 PM

## 2021-02-04 MED ORDER — DIVALPROEX SODIUM ER 500 MG PO TB24: 1000.0000 mg | ORAL_TABLET | Freq: Every day | ORAL | Status: DC

## 2021-02-04 NOTE — Assessment & Plan Note (Addendum)
CK even more elevated than previous evaluation.  Patient also has an uptrending alk phos and LFTs.  Talked with patient regarding neurologist follow-up and the patient reports that he can not get in until June or July.  I will call the neurologist office on Monday for further recommendations.  Regarding further evaluation for lower extremity weakness and the elevated CK we are collecting lab work including an ionized calcium, CRP, ESR, TSH.  I would like to follow-up with the patient in 2 weeks for further investigation of these issues.

## 2021-02-04 NOTE — Assessment & Plan Note (Signed)
Blood sugars still have worse fluctuations.  I asked in the high 300s but multiple blood sugars between 40-60.  Currently taking 12 units long-acting insulin daily.  Given these hypoglycemic episodes I am going to decrease his long-acting insulin to 10 units daily.  He is going to track his blood sugars and he is going to have an appointment with pharmacy in 1 week.

## 2021-02-06 ENCOUNTER — Telehealth: Payer: Self-pay | Admitting: Family Medicine

## 2021-02-06 DIAGNOSIS — K7030 Alcoholic cirrhosis of liver without ascites: Secondary | ICD-10-CM

## 2021-02-06 DIAGNOSIS — R748 Abnormal levels of other serum enzymes: Secondary | ICD-10-CM

## 2021-02-06 NOTE — Telephone Encounter (Signed)
I spoke with the patient over the weekend and informed him of his results and requested that he come back for further lab test on Monday.  He did not show up for his lab draws on Monday.  I have attempted to call him again and left a voicemail.  The labs are pending as future labs and he can come in anytime to have them drawn.

## 2021-02-07 ENCOUNTER — Other Ambulatory Visit: Payer: 59

## 2021-02-07 ENCOUNTER — Other Ambulatory Visit: Payer: Self-pay

## 2021-02-07 DIAGNOSIS — R748 Abnormal levels of other serum enzymes: Secondary | ICD-10-CM

## 2021-02-07 DIAGNOSIS — K7030 Alcoholic cirrhosis of liver without ascites: Secondary | ICD-10-CM

## 2021-02-07 DIAGNOSIS — R531 Weakness: Secondary | ICD-10-CM

## 2021-02-09 ENCOUNTER — Other Ambulatory Visit: Payer: Self-pay

## 2021-02-09 ENCOUNTER — Other Ambulatory Visit: Payer: 59

## 2021-02-11 LAB — COMPREHENSIVE METABOLIC PANEL
ALT: 153 IU/L — ABNORMAL HIGH (ref 0–44)
AST: 132 IU/L — ABNORMAL HIGH (ref 0–40)
Albumin/Globulin Ratio: 0.5 — ABNORMAL LOW (ref 1.2–2.2)
Albumin: 1.8 g/dL — CL (ref 3.8–4.9)
Alkaline Phosphatase: 186 IU/L — ABNORMAL HIGH (ref 44–121)
BUN/Creatinine Ratio: 10 (ref 9–20)
BUN: 7 mg/dL (ref 6–24)
Bilirubin Total: 0.2 mg/dL (ref 0.0–1.2)
CO2: 24 mmol/L (ref 20–29)
Calcium: 6.9 mg/dL — CL (ref 8.7–10.2)
Chloride: 96 mmol/L (ref 96–106)
Creatinine, Ser: 0.67 mg/dL — ABNORMAL LOW (ref 0.76–1.27)
Globulin, Total: 3.7 g/dL (ref 1.5–4.5)
Glucose: 53 mg/dL — ABNORMAL LOW (ref 65–99)
Potassium: 3.5 mmol/L (ref 3.5–5.2)
Sodium: 136 mmol/L (ref 134–144)
Total Protein: 5.5 g/dL — ABNORMAL LOW (ref 6.0–8.5)
eGFR: 110 mL/min/{1.73_m2} (ref >59)

## 2021-02-11 LAB — SEDIMENTATION RATE: Sed Rate: 57 mm/hr — ABNORMAL HIGH (ref 0–30)

## 2021-02-11 LAB — CK: Total CK: 6148 U/L (ref 41–331)

## 2021-02-11 LAB — C-REACTIVE PROTEIN: CRP: 3 mg/L (ref 0–10)

## 2021-02-11 LAB — TSH: TSH: 4.12 u[IU]/mL (ref 0.450–4.500)

## 2021-02-11 LAB — CALCIUM, IONIZED: Calcium, Ion: 4 mg/dL — ABNORMAL LOW (ref 4.5–5.6)

## 2021-02-13 ENCOUNTER — Ambulatory Visit: Payer: 59 | Admitting: Family Medicine

## 2021-02-27 ENCOUNTER — Other Ambulatory Visit: Payer: Self-pay

## 2021-02-27 ENCOUNTER — Encounter: Payer: Self-pay | Admitting: Family Medicine

## 2021-02-27 ENCOUNTER — Ambulatory Visit (INDEPENDENT_AMBULATORY_CARE_PROVIDER_SITE_OTHER): Payer: 59 | Admitting: Family Medicine

## 2021-02-27 VITALS — BP 122/84 | HR 94 | Ht 70.0 in | Wt 242.2 lb

## 2021-02-27 DIAGNOSIS — E1140 Type 2 diabetes mellitus with diabetic neuropathy, unspecified: Secondary | ICD-10-CM

## 2021-02-27 DIAGNOSIS — E7800 Pure hypercholesterolemia, unspecified: Secondary | ICD-10-CM | POA: Diagnosis not present

## 2021-02-27 DIAGNOSIS — Z794 Long term (current) use of insulin: Secondary | ICD-10-CM

## 2021-02-27 DIAGNOSIS — R748 Abnormal levels of other serum enzymes: Secondary | ICD-10-CM | POA: Diagnosis not present

## 2021-02-27 DIAGNOSIS — R531 Weakness: Secondary | ICD-10-CM

## 2021-02-27 LAB — POCT GLYCOSYLATED HEMOGLOBIN (HGB A1C): HbA1c, POC (controlled diabetic range): 5.7 % (ref 0.0–7.0)

## 2021-02-27 MED ORDER — BASAGLAR KWIKPEN 100 UNIT/ML ~~LOC~~ SOPN: 6.0000 [IU] | PEN_INJECTOR | Freq: Every day | SUBCUTANEOUS | 0 refills | Status: DC

## 2021-02-27 NOTE — Patient Instructions (Signed)
It was great seeing you today and I am so happy you are doing well.  Congratulations on getting so much strength.  Your hemoglobin A1c was 5.7 today which is actually very low.  I would like to decrease your Lantus to 60 units daily and we can follow-up in 3 months for follow-up diabetes check.  If you notice that your blood sugars are running extremely high please let us know and we can make some adjustments over the phone.  Regarding your weakness I want to repeat your CK today as well as check your liver enzymes because they were elevated at the last check.  I will call you with those results if there are any abnormalities but if they are improving I will just send you a message.  I have the paperwork to fill out your short-term disability and will get that filled out in the near future.  I hope you have a wonderful afternoon and if you have any questions or concerns please let me know.

## 2021-02-27 NOTE — Progress Notes (Signed)
    SUBJECTIVE:   CHIEF COMPLAINT / HPI:   Diabetes follow-up Patient feels his diabetes has been well controlled over the last month.  He reports he is currently taking 10 units of Lantus and has not using any mealtime coverage.  He has his glucometer with him which shows 1 hypoglycemic episode of 55 which patient reports was when he forgot to eat but the rest ranged between 110- 278 with most of them being in the low to mid 100s.  Patient reports that the hypoglycemic episode he felt like his blood sugar was low and was able to eat something.  Most recent hemoglobin A1c was 6.9.  Weakness follow-up Patient reports that he is feeling better and better each day since starting physical therapy and discontinuing his statin medication.  At last visit he was walking with a walker and had difficulty even with that.  Today he is able to walk with a cane.  He also reports that he has been lifting weights and before he could not lift his dumbbells and now he is doing repetitions with him.  He has brought short-term disability paperwork to fill out which should help cover him for the last 6 months while he has been out of work.  He is also interested in having long-term disability filled out but given his improvement he is not sure that he will need it so we will hold off on that.  OBJECTIVE:   BP 122/84   Pulse 94   Ht 5\' 10"  (1.778 m)   Wt 242 lb 3.2 oz (109.9 kg)   SpO2 96%   BMI 34.75 kg/m   General: Well-appearing 55 year old male in no acute distress Cardiac: Regular rate and rhythm, no murmurs appreciated Respiratory: Normal work of breathing, lungs clear to auscultation bilaterally Abdomen: Soft, nontender, positive bowel sounds MSK: Patient is able to ambulate around the room with a walker without difficulty.  He is able to get up from the chair without difficulty.  ASSESSMENT/PLAN:   Type 2 diabetes mellitus with diabetic neuropathy, with long-term current use of insulin ([place]) Patient  feels his blood sugars have been relatively stable over the last months.  1 hypoglycemic episode of 55.  The rest of his blood sugars have been within normal limits to slightly elevated.  Currently taking 10 units of Lantus with no mealtime coverage.  Last hemoglobin A1c was 6.9.  Hemoglobin A1c today of 5.7.  Decreased Lantus to 6 units daily.  We will recheck hemoglobin A1c in 3 months and adjust further.  Hope to discontinue insulin at some point in the near future.  Discussed signs and symptoms of hypoglycemia and patient will continue to monitor.  Weakness Weakness greatly improved from previous evaluation.  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  From chart review patient was on Depakote which was transitioned to [place] but then back to Depakote.  The drug drug interaction could have contributed to this reaction.  We will recheck CK level today to ensure continued resolution as well as recheck LFTs given previous elevations.  Patient will continue to work with physical therapy and we will reevaluate the patient in approximately 1 month.     [name], MD [place]

## 2021-02-28 ENCOUNTER — Other Ambulatory Visit: Payer: Self-pay

## 2021-02-28 ENCOUNTER — Encounter: Payer: Self-pay | Admitting: Physical Therapy

## 2021-02-28 ENCOUNTER — Ambulatory Visit: Payer: 59 | Attending: Family Medicine | Admitting: Physical Therapy

## 2021-02-28 VITALS — BP 128/97 | HR 115

## 2021-02-28 DIAGNOSIS — R2689 Other abnormalities of gait and mobility: Secondary | ICD-10-CM

## 2021-02-28 DIAGNOSIS — M6281 Muscle weakness (generalized): Secondary | ICD-10-CM

## 2021-02-28 LAB — COMPREHENSIVE METABOLIC PANEL
ALT: 41 IU/L (ref 0–44)
AST: 61 IU/L — ABNORMAL HIGH (ref 0–40)
Albumin/Globulin Ratio: 0.6 — ABNORMAL LOW (ref 1.2–2.2)
Albumin: 2.1 g/dL — ABNORMAL LOW (ref 3.8–4.9)
Alkaline Phosphatase: 214 IU/L — ABNORMAL HIGH (ref 44–121)
BUN/Creatinine Ratio: 6 — ABNORMAL LOW (ref 9–20)
BUN: 5 mg/dL — ABNORMAL LOW (ref 6–24)
Bilirubin Total: 0.3 mg/dL (ref 0.0–1.2)
CO2: 20 mmol/L (ref 20–29)
Calcium: 7.1 mg/dL — ABNORMAL LOW (ref 8.7–10.2)
Chloride: 98 mmol/L (ref 96–106)
Creatinine, Ser: 0.89 mg/dL (ref 0.76–1.27)
Globulin, Total: 3.8 g/dL (ref 1.5–4.5)
Glucose: 112 mg/dL — ABNORMAL HIGH (ref 65–99)
Potassium: 4.3 mmol/L (ref 3.5–5.2)
Sodium: 138 mmol/L (ref 134–144)
Total Protein: 5.9 g/dL — ABNORMAL LOW (ref 6.0–8.5)
eGFR: 101 mL/min/{1.73_m2} (ref >59)

## 2021-02-28 LAB — LIPID PANEL
Chol/HDL Ratio: 3.4 ratio (ref 0.0–5.0)
Cholesterol, Total: 110 mg/dL (ref 100–199)
HDL: 32 mg/dL — ABNORMAL LOW (ref >39)
LDL Chol Calc (NIH): 58 mg/dL (ref 0–99)
Triglycerides: 106 mg/dL (ref 0–149)
VLDL Cholesterol Cal: 20 mg/dL (ref 5–40)

## 2021-02-28 LAB — CK: Total CK: 441 U/L — ABNORMAL HIGH (ref 41–331)

## 2021-02-28 NOTE — Assessment & Plan Note (Signed)
Weakness greatly improved from previous evaluation.  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  From chart review patient was on Depakote which was transitioned to [place] but then back to Depakote.  The drug drug interaction could have contributed to this reaction.  We will recheck CK level today to ensure continued resolution as well as recheck LFTs given previous elevations.  Patient will continue to work with physical therapy and we will reevaluate the patient in approximately 1 month.

## 2021-02-28 NOTE — Assessment & Plan Note (Signed)
Patient feels his blood sugars have been relatively stable over the last months.  1 hypoglycemic episode of 55.  The rest of his blood sugars have been within normal limits to slightly elevated.  Currently taking 10 units of Lantus with no mealtime coverage.  Last hemoglobin A1c was 6.9.  Hemoglobin A1c today of 5.7.  Decreased Lantus to 6 units daily.  We will recheck hemoglobin A1c in 3 months and adjust further.  Hope to discontinue insulin at some point in the near future.  Discussed signs and symptoms of hypoglycemia and patient will continue to monitor.

## 2021-02-28 NOTE — Patient Instructions (Signed)
Access Code: [email or web address] URL: [email or web address] Date: 02/28/2021 Prepared by: Shearon Balo  Exercises Seated Hip Abduction with Resistance - 1 x daily - 7 x weekly - 3 sets - 10 reps Seated Long Arc Quad - 1 x daily - 7 x weekly - 3 sets - 10 reps Seated Hamstring Curl with Anchored Resistance - 1 x daily - 7 x weekly - 3 sets - 10 reps Seated Hip Adduction Isometrics with Ball - 1 x daily - 7 x weekly - 3 sets - 10 reps

## 2021-02-28 NOTE — Therapy (Signed)
[place], [place], [address] Phone: [phone]   Fax:  [phone]  Physical Therapy Evaluation  Patient Details  Name: Jeffrey [name] MRN: [id number] Date of Birth: 1965-10-23 Referring Provider (PT): Leeanne Rio, MD   Encounter Date: 02/28/2021   PT End of Session - 02/28/21 1722    Visit Number 1    Number of Visits 16    Date for PT Re-Evaluation 04/25/21    Authorization - Number of Visits 30    PT Start Time 1525    PT Stop Time 1610    PT Time Calculation (min) 45 min    Equipment Utilized During Treatment Other (comment)   BP and pulse o2   Activity Tolerance Patient tolerated treatment well;Patient limited by fatigue    Behavior During Therapy [facility] for tasks assessed/performed           Past Medical History:  Diagnosis Date  . Acute renal insufficiency 12/08/2012  . AKI (acute kidney injury) ([place])   . ALCOHOL ABUSE, HX OF 11/10/2007  . Anoxic encephalopathy ([place])   . Anxiety   . Bleeding ulcer 2014  . Blood transfusion 2014   "related to bleeding ulcer"  . Cardiac arrest ([place]) 12/07/2012   Anoxic encephalopathy  . GERD (gastroesophageal reflux disease)   . High cholesterol   . Hypertension   . OSA on CPAP 12/07/2012  . Seizure-like activity ([place])   . Seizures ([place])    last seizure 01/05/2021  . Type II diabetes mellitus ([place])   . Venous insufficiency 12/13/2011    Past Surgical History:  Procedure Laterality Date  . BIOPSY  11/01/2020   Procedure: BIOPSY;  Surgeon: Irene Shipper, MD;  Location: [place];  Service: Endoscopy;;  . CARDIOVASCULAR STRESS TEST  03/16/13   Very Poor Exercise Tolerance; NON DIAGNOSTIC TEST  . CATARACT EXTRACTION W/ INTRAOCULAR LENS IMPLANT Left 03/07/2014   [facility] @ [facility]  . ESOPHAGOGASTRODUODENOSCOPY Left 11/26/2012   Procedure: ESOPHAGOGASTRODUODENOSCOPY (EGD);  Surgeon: Wonda Horner, MD;  Location: [facility] ENDOSCOPY;  Service:  Endoscopy;  Laterality: Left;  . ESOPHAGOGASTRODUODENOSCOPY N/A 10/10/2014   Procedure: ESOPHAGOGASTRODUODENOSCOPY (EGD);  Surgeon: Ladene Artist, MD;  Location: [facility] ENDOSCOPY;  Service: Endoscopy;  Laterality: N/A;  . ESOPHAGOGASTRODUODENOSCOPY (EGD) WITH PROPOFOL N/A 11/01/2020   Procedure: ESOPHAGOGASTRODUODENOSCOPY (EGD) WITH PROPOFOL;  Surgeon: Irene Shipper, MD;  Location: [facility] ENDOSCOPY;  Service: Endoscopy;  Laterality: N/A;  . KNEE ARTHROSCOPY Left ~ 1999  . UPPER GASTROINTESTINAL ENDOSCOPY      Vitals:   02/28/21 1536  BP: (!) 128/97  Pulse: (!) 115  SpO2: 100%      Subjective Assessment - 02/28/21 1522    Subjective Pt reports that he was prescribed new medications in January.  He tripped and fell down and was unable to get up d/t weakness.  This is his CC.  His strength diminished to the point where he was using a rolling walker (no AD baseline).  About 1 month ago his statin was changed and his strength was improved.  He has trouble with transfers during toileting and requires assistanc.  He has a history of seizures.  Last seizure was February first.  He lost BB control during this seziure and now this happens every night when he sleeps.  He is following up about this with neuroloy in June.  History of cardiac arrest 2014.    Pertinent History DM, hx of cardiac arrest, HTN, seizures, FALL  RISK    Limitations Lifting;Standing;Walking    Diagnostic tests NA    Patient Stated Goals regain strength              [facility] PT Assessment - 02/28/21 0001      Assessment   Medical Diagnosis Weakness (R53.1)    Referring Provider (PT) Leeanne Rio, MD    Onset Date/Surgical Date 10/07/20    Hand Dominance Right    Next MD Visit neurology in June for weakness and seizures      Precautions   Precaution Comments DM, hx of cardiac arrest, HTN, seizures, FALL RISK, tachycardia, MONITOR VITALS      Restrictions   Other Position/Activity Restrictions none      Balance Screen    Has the patient fallen in the past 6 months Yes    How many times? 12    Has the patient had a decrease in activity level because of a fear of falling?  Yes    Is the patient reluctant to leave their home because of a fear of falling?  Yes      [place] Private residence    Living Arrangements Spouse/significant other;Children    Available Help at Discharge Family    Type of [place] - single point;Walker - 2 wheels      Prior Function   Level of Independence Independent with basic ADLs   some help with toileting   Vocation Retired      Observation/Other Assessments   Observations walking with SPC, unsteady gait      Sensation   Light Touch Appears Intact      Functional Tests   Functional tests --   progressive balance: unable to maintain feet together, 30'' STS: 4x, Gait speed: .61 m/s     Strength   Overall Strength Comments UE strength is grossly 4/5, LE MMT grossly 3+/5      Ambulation/Gait   Gait velocity .61 m/s    Gait Comments slow gait, using SPC                   Objective measurements completed on examination: See above findings.               PT Education - 02/28/21 1726    Education Details Importance of follow up with MD about high resting heart rate, HEP, prognosis, POC    Person(s) Educated Patient    Methods Explanation;Demonstration;Verbal cues    Comprehension Verbalized understanding;Returned demonstration            PT Short Term Goals - 02/28/21 1744      PT SHORT TERM GOAL #1   [place] will be >75% HEP compliant within 3 weeks to facilitate carryover between sessions and move towards eventual independent management of their condition.    Time 3    Period Weeks    Status New    Target Date 03/21/21             PT Long Term Goals - 02/28/21 1745      PT LONG TERM GOAL #1   Title  Jeffrey [name] will improve 30'' STS (MCID 2) to >/= 10x to show improved LE strength and improved transfers.    Time 8    Period Weeks    Status New  Target Date 04/25/21      PT LONG TERM GOAL #2   Title Jeffrey [name] will improve gait speed to .9 m/s (.1 m/s MCID) to show functional improvement in ambulation    Baseline .49m/s    Time 8    Period Weeks    Status New    Target Date 04/25/21      PT LONG TERM GOAL #3   Title Jeffrey [name] will be able to stand for >30''' in tandem stance, to show a significant improvement in balance in order to reduce fall risk    Baseline unable    Time 8    Period Weeks    Status New    Target Date 04/25/21      PT LONG TERM GOAL #4   Title Jeffrey [name] will be able to transfer to and from toilet independently    Baseline unable    Time 8    Period Weeks    Status New    Target Date 04/25/21                  Plan - 02/28/21 1553    Clinical Impression Statement Jeffrey [name] is a 55 y.o. male who presents to clinic with signs and sxs consistent with generalized weakness potentially secondary to side effect of statin which has since been changed.  He has a complicated medical history including DM, hx of cardiac arrest, HTN, seizures, FALLS, and tachycardia.  He did present tachycardic in clinic.  He denies palpitations, chest pressure, SOB, chest pain, or arm or jaw pain.  He reported that his heart rate is typically elevated, but 115 is higher than normal (he has known tachycardia dating to 2021).  I advised him to follow up with his MD immediately following therapy; he confirmed he would    Pt presents with notable deficits in: balance, gait and srength.  Pt is limited functionally in transfers, ambulation, stairs, and toileting.  Pt will benefit from skilled therapy to address pain and the listed deficits in order to achieve functional goals, enable safety and independence in completion of daily tasks, and return to  PLOF.    Personal Factors and Comorbidities Comorbidity 3+    Comorbidities DM, hx of cardiac arrest, HTN, seizures, FALLS    Examination-Activity Limitations Bathing;Carry;Lift;Sit;Stand;Toileting    Examination-Participation Restrictions Church;Community Activity    Stability/Clinical Decision Making Evolving/Moderate complexity    Clinical Decision Making Moderate    Rehab Potential Good    PT Frequency 2x / week    PT Duration 8 weeks    PT Treatment/Interventions ADLs/Self Care Home Management;Therapeutic exercise;Neuromuscular re-education;Manual techniques;Gait training;Therapeutic activities;Dry needling    PT Next Visit Plan EVALUATE VITALS, ask about follow up regarding tachycardia, follow up on possible bedside commode    PT Home Exercise Plan [phone]    Recommended Other Services Follow up with primary care regarding vitals    Consulted and Agree with Plan of Care Patient           Patient will benefit from skilled therapeutic intervention in order to improve the following deficits and impairments:  Abnormal gait,Decreased activity tolerance,Decreased knowledge of use of DME,Decreased strength,Impaired UE functional use,Difficulty walking,Decreased balance  Visit Diagnosis: Muscle weakness  Balance problem  Other abnormalities of gait and mobility   Problem List Patient Active Problem List   Diagnosis Date Noted  . Grief counseling 01/04/2021  . Incontinence 12/23/2020  . Hospital discharge follow-up 11/24/2020  . Syncope  11/17/2020  . Hypomagnesemia 11/05/2020  . Hematemesis with nausea   . Alcohol abuse with intoxication ([place])   . Duodenal ulcer hemorrhagic   . GI bleed 10/31/2020  . Hyperglycemia 10/31/2020  . DKA (diabetic ketoacidosis) ([place]) 07/22/2020  . Candidiasis 07/22/2020  . Hyponatremia 06/28/2020  . Eyebrow laceration, left, initial encounter   . Hypokalemia   . Hyperlipidemia associated with type 2 diabetes mellitus ([place])   . MGUS  (monoclonal gammopathy of unknown significance) 05/31/2020  . Seizure disorder ([place]) 03/16/2020  . Type 2 diabetes mellitus with hyperglycemia, with long-term current use of insulin ([place]) 03/16/2020  . Tremor 03/16/2020  . Colon cancer screening 02/22/2020  . Stage 3a chronic kidney disease ([place]) 01/29/2020  . Hyperphosphatemia 01/29/2020  . Major depressive disorder, recurrent episode ([place]) 01/13/2018  . Weakness   . Hypotension 07/27/2017  . Seizure ([place]) 04/08/2017  . Proteinuria 11/23/2015  . Essential hypertension   . Cirrhosis ([place]) 03/22/2015  . Ataxia 03/20/2015  . Elevated liver enzymes   . Dysarthria 03/09/2015  . AKI (acute kidney injury) ([place])   . Acute blood loss anemia 11/17/2014  . ETOH abuse   . Hyperkalemia   . Tachycardia   . Melena 10/10/2014  . Erosive esophagitis 10/10/2014  . Somnolence 10/08/2014  . Acute kidney injury ([place])   . OSA on CPAP 12/28/2012  . Cardiac arrest ([place]) 12/07/2012  . Anoxic brain injury ([place]) 12/07/2012  . ERECTILE DYSFUNCTION 04/14/2007  . Type 2 diabetes mellitus with diabetic neuropathy, with long-term current use of insulin ([place]) 12/04/2006  . HYPERCHOLESTEROLEMIA 12/04/2006    Shearon Balo PT, DPT 02/28/21 6:39 PM  [facility] [address] [place], [place], [address] Phone: [phone]   Fax:  [phone]  Name: Jeffrey [name] MRN: [id number] Date of Birth: 1966-02-01

## 2021-03-02 ENCOUNTER — Other Ambulatory Visit: Payer: Self-pay | Admitting: Family Medicine

## 2021-03-02 DIAGNOSIS — R569 Unspecified convulsions: Secondary | ICD-10-CM

## 2021-03-06 NOTE — Telephone Encounter (Signed)
Pt states he is taking the Keppra and wanted to know if you had sent in his other medication. Jeffrey [name].

## 2021-03-06 NOTE — Telephone Encounter (Signed)
Called patient to discuss seizure medications.  He reports that he is taking only the Keppra and is not taking Depakote at this time.  I have discontinued the Depakote on his med list and have sent a prescription for his Keppra to his pharmacy.  I told him that if he did receive the prescription for the Depakote to not take it and patient is agreeable to this.

## 2021-03-09 ENCOUNTER — Telehealth (HOSPITAL_COMMUNITY): Payer: Self-pay | Admitting: Family Medicine

## 2021-03-09 NOTE — Telephone Encounter (Signed)
-----   Message from Mathis Dad, PT sent at 02/28/2021  6:47 PM EDT ----- Regarding: Tachycardia and bedside commode Dr. Caron Presume,  I completed Jeffrey [name]'s PT evaluation today.  His resting heart rate today was 110-115 bpm.  He was asymptomatic.  He had no complaints of palpitation, chest pain, or SOB.  He told me that it was typically elevated, but that this was somewhat higher than normal.  I asked him to give your office a call to see if he needed to come in, but I just wanted to touch base as well to make sure the message got conveyed.   Separately, he would greatly benefit from a raised toilet seat with handles.  He currently needs assistance from his son, but is able to transfer independently if he has upper extremity support.  If it is possible for you to write a prescription, that would be great.  Please let me know of any questions.  Erick Colace, PT

## 2021-03-09 NOTE — Telephone Encounter (Signed)
Received note from PT recommending we get this patient a raised toilet seat with handles.  Order placed.

## 2021-03-13 ENCOUNTER — Other Ambulatory Visit: Payer: Self-pay

## 2021-03-13 ENCOUNTER — Ambulatory Visit: Payer: 59 | Attending: Family Medicine | Admitting: Physical Therapy

## 2021-03-13 ENCOUNTER — Encounter: Payer: Self-pay | Admitting: Physical Therapy

## 2021-03-13 VITALS — BP 149/102 | HR 107

## 2021-03-13 DIAGNOSIS — R2689 Other abnormalities of gait and mobility: Secondary | ICD-10-CM | POA: Insufficient documentation

## 2021-03-13 DIAGNOSIS — M6281 Muscle weakness (generalized): Secondary | ICD-10-CM | POA: Diagnosis not present

## 2021-03-13 NOTE — Patient Instructions (Signed)
Access Code: [email or web address] URL: [email or web address] Date: 03/13/2021 Prepared by: Shearon Balo  Exercises Seated Hip Abduction with Resistance - 1 x daily - 7 x weekly - 3 sets - 10 reps Seated Long Arc Quad - 1 x daily - 7 x weekly - 3 sets - 10 reps Seated Hamstring Curl with Anchored Resistance - 1 x daily - 7 x weekly - 3 sets - 10 reps Seated Hip Adduction Isometrics with Ball - 1 x daily - 7 x weekly - 3 sets - 10 reps Sit to Stand with Counter Support - 1 x daily - 7 x weekly - 3 sets - 4 reps

## 2021-03-13 NOTE — Therapy (Signed)
[place], [place], [address] Phone: [phone]   Fax:  [phone]  Physical Therapy Treatment  Patient Details  Name: Jeffrey [name] MRN: [id number] Date of Birth: 1966/07/12 Referring Provider (PT): Leeanne Rio, MD   Encounter Date: 03/13/2021   PT End of Session - 03/13/21 1401    Visit Number 2    Number of Visits 16    Date for PT Re-Evaluation 04/25/21    Authorization - Number of Visits 30    PT Start Time 0200    PT Stop Time 0245    PT Time Calculation (min) 45 min    Equipment Utilized During Treatment Gait belt   BP and pulse o2   Activity Tolerance Patient tolerated treatment well;Patient limited by fatigue    Behavior During Therapy [facility] for tasks assessed/performed           Past Medical History:  Diagnosis Date  . Acute renal insufficiency 12/08/2012  . AKI (acute kidney injury) ([place])   . ALCOHOL ABUSE, HX OF 11/10/2007  . Anoxic encephalopathy ([place])   . Anxiety   . Bleeding ulcer 2014  . Blood transfusion 2014   "related to bleeding ulcer"  . Cardiac arrest ([place]) 12/07/2012   Anoxic encephalopathy  . GERD (gastroesophageal reflux disease)   . High cholesterol   . Hypertension   . OSA on CPAP 12/07/2012  . Seizure-like activity ([place])   . Seizures ([place])    last seizure 01/05/2021  . Type II diabetes mellitus ([place])   . Venous insufficiency 12/13/2011    Past Surgical History:  Procedure Laterality Date  . BIOPSY  11/01/2020   Procedure: BIOPSY;  Surgeon: Irene Shipper, MD;  Location: [place];  Service: Endoscopy;;  . CARDIOVASCULAR STRESS TEST  03/16/13   Very Poor Exercise Tolerance; NON DIAGNOSTIC TEST  . CATARACT EXTRACTION W/ INTRAOCULAR LENS IMPLANT Left 03/07/2014   [place] @ [facility]  . ESOPHAGOGASTRODUODENOSCOPY Left 11/26/2012   Procedure: ESOPHAGOGASTRODUODENOSCOPY (EGD);  Surgeon: Wonda Horner, MD;  Location: [facility] ENDOSCOPY;  Service: Endoscopy;   Laterality: Left;  . ESOPHAGOGASTRODUODENOSCOPY N/A 10/10/2014   Procedure: ESOPHAGOGASTRODUODENOSCOPY (EGD);  Surgeon: Ladene Artist, MD;  Location: [name] ENDOSCOPY;  Service: Endoscopy;  Laterality: N/A;  . ESOPHAGOGASTRODUODENOSCOPY (EGD) WITH PROPOFOL N/A 11/01/2020   Procedure: ESOPHAGOGASTRODUODENOSCOPY (EGD) WITH PROPOFOL;  Surgeon: Irene Shipper, MD;  Location: [facility] ENDOSCOPY;  Service: Endoscopy;  Laterality: N/A;  . KNEE ARTHROSCOPY Left ~ 1999  . UPPER GASTROINTESTINAL ENDOSCOPY      Vitals:   03/13/21 1407  BP: (!) 149/102  Pulse: (!) 107     Subjective Assessment - 03/13/21 1401    Subjective Pt reports that he has been doing fairly well.  He called his MD about his elevated heart rate and was told to monitor his HR at home.  He reports that he fell on tuesday after tripping on his footboard.  EMS was required for him to stand up.  He denies injury.    Pertinent History DM, hx of cardiac arrest, HTN, seizures, FALL RISK    Limitations Lifting;Standing;Walking    Diagnostic tests NA    Patient Stated Goals regain strength    Currently in Pain? No/denies                             [facility] Adult PT Treatment/Exercise - 03/13/21 0001      Exercises  Exercises Shoulder;Lumbar;Knee/Hip;Ankle      Lumbar Exercises: Aerobic   Nustep 5 min L4      Lumbar Exercises: Seated   Sit to Stand Limitations 4 reps 3 sets from airex pad      Knee/Hip Exercises: Seated   Long Arc Quad Limitations 5# 2x7      Knee/Hip Exercises: Supine   Hip Adduction Isometric Limitations 10x 10''    Other Supine Knee/Hip Exercises clamshell YTB - 3x10                    PT Short Term Goals - 02/28/21 1744      PT SHORT TERM GOAL #1   Title Jeffrey [name] will be >75% HEP compliant within 3 weeks to facilitate carryover between sessions and move towards eventual independent management of their condition.    Time 3    Period Weeks    Status New    Target Date  03/21/21             PT Long Term Goals - 02/28/21 1745      PT LONG TERM GOAL #1   Title Jeffrey [name] will improve 30'' STS (MCID 2) to >/= 10x to show improved LE strength and improved transfers.    Time 8    Period Weeks    Status New    Target Date 04/25/21      PT LONG TERM GOAL #2   Title Jeffrey [name] will improve gait speed to .9 m/s (.1 m/s MCID) to show functional improvement in ambulation    Baseline .83m/s    Time 8    Period Weeks    Status New    Target Date 04/25/21      PT LONG TERM GOAL #3   Title Jeffrey [name] will be able to stand for >30''' in tandem stance, to show a significant improvement in balance in order to reduce fall risk    Baseline unable    Time 8    Period Weeks    Status New    Target Date 04/25/21      PT LONG TERM GOAL #4   Title Jeffrey [name] will be able to transfer to and from toilet independently    Baseline unable    Time 8    Period Weeks    Status New    Target Date 04/25/21                 Plan - 03/13/21 1436    Clinical Impression Statement Pt is progressing as expected with therapy.  He fatigues quickly with all therex.  Sit to stand modified with airex pad to allow redued UE support.  Updated HEP to include sit to stand with counter/walker support to reduce fall risk; pt confirms that he will use walker/counter.  Will continue general LE strenthening with concentration on transfers innitially.    PT Frequency 2x / week    PT Duration 8 weeks    PT Treatment/Interventions ADLs/Self Care Home Management;Therapeutic exercise;Neuromuscular re-education;Manual techniques;Gait training;Therapeutic activities;Dry needling    PT Next Visit Plan EVALUATE VITALS, ask about follow up regarding tachycardia, follow up on possible bedside commode    PT Home Exercise Plan [phone]    Consulted and Agree with Plan of Care Patient           Patient will benefit from skilled therapeutic intervention in order to  improve the following deficits and impairments:  Abnormal gait,Decreased  activity tolerance,Decreased knowledge of use of DME,Decreased strength,Impaired UE functional use,Difficulty walking,Decreased balance  Visit Diagnosis: Muscle weakness  Balance problem  Other abnormalities of gait and mobility     Problem List Patient Active Problem List   Diagnosis Date Noted  . Grief counseling 01/04/2021  . Incontinence 12/23/2020  . Hospital discharge follow-up 11/24/2020  . Syncope 11/17/2020  . Hypomagnesemia 11/05/2020  . Hematemesis with nausea   . Alcohol abuse with intoxication ([place])   . Duodenal ulcer hemorrhagic   . GI bleed 10/31/2020  . Hyperglycemia 10/31/2020  . DKA (diabetic ketoacidosis) ([place]) 07/22/2020  . Candidiasis 07/22/2020  . Hyponatremia 06/28/2020  . Eyebrow laceration, left, initial encounter   . Hypokalemia   . Hyperlipidemia associated with type 2 diabetes mellitus ([place])   . MGUS (monoclonal gammopathy of unknown significance) 05/31/2020  . Seizure disorder ([place]) 03/16/2020  . Type 2 diabetes mellitus with hyperglycemia, with long-term current use of insulin ([place]) 03/16/2020  . Tremor 03/16/2020  . Colon cancer screening 02/22/2020  . Stage 3a chronic kidney disease ([place]) 01/29/2020  . Hyperphosphatemia 01/29/2020  . Major depressive disorder, recurrent episode ([place]) 01/13/2018  . Weakness   . Hypotension 07/27/2017  . Seizure ([place]) 04/08/2017  . Proteinuria 11/23/2015  . Essential hypertension   . Cirrhosis ([place]) 03/22/2015  . Ataxia 03/20/2015  . Elevated liver enzymes   . Dysarthria 03/09/2015  . AKI (acute kidney injury) ([place])   . Acute blood loss anemia 11/17/2014  . ETOH abuse   . Hyperkalemia   . Tachycardia   . Melena 10/10/2014  . Erosive esophagitis 10/10/2014  . Somnolence 10/08/2014  . Acute kidney injury ([place])   . OSA on CPAP 12/28/2012  . Cardiac arrest ([place]) 12/07/2012  . Anoxic brain injury ([place]) 12/07/2012  . ERECTILE  DYSFUNCTION 04/14/2007  . Type 2 diabetes mellitus with diabetic neuropathy, with long-term current use of insulin ([place]) 12/04/2006  . HYPERCHOLESTEROLEMIA 12/04/2006    Shearon Balo PT, DPT 03/13/21 2:52 PM  [facility] [address] [place], [place], [address] Phone: [phone]   Fax:  [phone]  Name: Jeffrey [name] MRN: [id number] Date of Birth: [birth year]

## 2021-03-13 NOTE — Telephone Encounter (Signed)
Received below message from [employer].   received, thanks   Terressa Evola C Cristian Davitt, RN   

## 2021-03-13 NOTE — Telephone Encounter (Signed)
Community message sent to [employer] for raised toilet seat with handles.   Will await response.   Talbot Grumbling, RN

## 2021-03-15 ENCOUNTER — Ambulatory Visit: Payer: 59 | Admitting: Physical Therapy

## 2021-03-20 ENCOUNTER — Ambulatory Visit: Payer: 59 | Admitting: Physical Therapy

## 2021-03-20 ENCOUNTER — Telehealth: Payer: Self-pay | Admitting: Physical Therapy

## 2021-03-20 NOTE — Telephone Encounter (Signed)
Called and informed patient of missed visit and provided reminder of next appt and attendance policy.  

## 2021-03-22 ENCOUNTER — Ambulatory Visit: Payer: 59 | Admitting: Physical Therapy

## 2021-03-22 ENCOUNTER — Other Ambulatory Visit: Payer: Self-pay

## 2021-03-22 ENCOUNTER — Encounter: Payer: Self-pay | Admitting: Physical Therapy

## 2021-03-22 VITALS — HR 110

## 2021-03-22 DIAGNOSIS — R2689 Other abnormalities of gait and mobility: Secondary | ICD-10-CM

## 2021-03-22 DIAGNOSIS — M6281 Muscle weakness (generalized): Secondary | ICD-10-CM | POA: Diagnosis not present

## 2021-03-22 NOTE — Therapy (Signed)
[place], [place], [address] Phone: [phone]   Fax:  [phone]  Physical Therapy Treatment  Patient Details  Name: Jeffrey [name] MRN: [id number] Date of Birth: Feb 18, 1966 Referring Provider (PT): Leeanne Rio, MD      Encounter Date: 03/22/2021   PT End of Session - 03/22/21 1741     Visit Number 3    Number of Visits 16    Date for PT Re-Evaluation 04/25/21    Authorization - Number of Visits 30    PT Start Time 0532    PT Stop Time 0615    PT Time Calculation (min) 43 min    Equipment Utilized During Treatment Gait belt   pulse o2   Activity Tolerance Patient tolerated treatment well;Patient limited by fatigue    Behavior During Therapy [facility] for tasks assessed/performed             Past Medical History:  Diagnosis Date   Acute renal insufficiency 12/08/2012   AKI (acute kidney injury) ([place])    ALCOHOL ABUSE, HX OF 11/10/2007   Anoxic encephalopathy ([place])    Anxiety    Bleeding ulcer 2014   Blood transfusion 2014   "related to bleeding ulcer"   Cardiac arrest ([place]) 12/07/2012   Anoxic encephalopathy   GERD (gastroesophageal reflux disease)    High cholesterol    Hypertension    OSA on CPAP 12/07/2012   Seizure-like activity ([place])    Seizures ([place])    last seizure 01/05/2021   Type II diabetes mellitus ([place])    Venous insufficiency 12/13/2011    Past Surgical History:  Procedure Laterality Date   BIOPSY  11/01/2020   Procedure: BIOPSY;  Surgeon: Irene Shipper, MD;  Location: [facility] ENDOSCOPY;  Service: Endoscopy;;   CARDIOVASCULAR STRESS TEST  03/16/13   Very Poor Exercise Tolerance; NON DIAGNOSTIC TEST   CATARACT EXTRACTION W/ INTRAOCULAR LENS IMPLANT Left 03/07/2014   [place] @ [facility]   ESOPHAGOGASTRODUODENOSCOPY Left 11/26/2012   Procedure: ESOPHAGOGASTRODUODENOSCOPY (EGD);  Surgeon: Wonda Horner, MD;  Location: [facility] ENDOSCOPY;  Service: Endoscopy;  Laterality:  Left;   ESOPHAGOGASTRODUODENOSCOPY N/A 10/10/2014   Procedure: ESOPHAGOGASTRODUODENOSCOPY (EGD);  Surgeon: Ladene Artist, MD;  Location: [facility] ENDOSCOPY;  Service: Endoscopy;  Laterality: N/A;   ESOPHAGOGASTRODUODENOSCOPY (EGD) WITH PROPOFOL N/A 11/01/2020   Procedure: ESOPHAGOGASTRODUODENOSCOPY (EGD) WITH PROPOFOL;  Surgeon: Irene Shipper, MD;  Location: [facility] ENDOSCOPY;  Service: Endoscopy;  Laterality: N/A;   KNEE ARTHROSCOPY Left ~ 1999   UPPER GASTROINTESTINAL ENDOSCOPY      Vitals:   03/22/21 1818  Pulse: (!) 110  SpO2: 98%     Subjective Assessment - 03/22/21 1741     Subjective Pt reports that he is improving.  he is walking without his cane in the house often.  He went to his MD concerning his vitals and everything appeared ok (per pt).  he feels he his getting his strength back.    Pertinent History DM, hx of cardiac arrest, HTN, seizures, FALL RISK    Limitations Lifting;Standing;Walking    Diagnostic tests NA    Patient Stated Goals regain strength                               [facility] Adult PT Treatment/Exercise - 03/22/21 0001       Lumbar Exercises: Aerobic   Nustep 5 min L6  Lumbar Exercises: Seated   Sit to Stand Limitations 2x5 w/ UE support      Knee/Hip Exercises: Standing   Other Standing Knee Exercises in // x20: march, hip abd      Knee/Hip Exercises: Seated   Long Arc Quad Limitations 5# 2x10    Other Seated Knee/Hip Exercises clamshell RTB - 3x10    Hamstring Limitations RTB 2x10    Abd/Adduction Limitations 10x 10''                      PT Short Term Goals - 02/28/21 1744       PT SHORT TERM GOAL #1   Title Jeffrey [name] will be >75% HEP compliant within 3 weeks to facilitate carryover between sessions and move towards eventual independent management of their condition.    Time 3    Period Weeks    Status New    Target Date 03/21/21               PT Long Term Goals - 02/28/21 1745       PT LONG  TERM GOAL #1   Title Jeffrey [name] will improve 30'' STS (MCID 2) to >/= 10x to show improved LE strength and improved transfers.    Time 8    Period Weeks    Status New    Target Date 04/25/21      PT LONG TERM GOAL #2   Title Jeffrey [name] will improve gait speed to .9 m/s (.1 m/s MCID) to show functional improvement in ambulation    Baseline .40m/s    Time 8    Period Weeks    Status New    Target Date 04/25/21      PT LONG TERM GOAL #3   Title Jeffrey [name] will be able to stand for >30''' in tandem stance, to show a significant improvement in balance in order to reduce fall risk    Baseline unable    Time 8    Period Weeks    Status New    Target Date 04/25/21      PT LONG TERM GOAL #4   Title Jeffrey [name] will be able to transfer to and from toilet independently    Baseline unable    Time 8    Period Weeks    Status New    Target Date 04/25/21                   Plan - 03/22/21 1800     Clinical Impression [place] is progressing well with therapy.  Today we concentrated on lower extremity strengthening and increasing activity tolerance.  Pt is able to tolerate some standing exercise today, but does show considerable fatigue with this.  Pt reports no pain. HEP was not updated as current program is adequately challenging.  Pt will continue to benefit from skilled physical therapy to address remaining deficits and achieve listed goals.  Continue per POC.    PT Frequency 2x / week    PT Duration 8 weeks    PT Treatment/Interventions ADLs/Self Care Home Management;Therapeutic exercise;Neuromuscular re-education;Manual techniques;Gait training;Therapeutic activities;Dry needling    PT Next Visit Plan EVALUATE VITALS, ask about follow up regarding tachycardia, follow up on possible bedside commode    PT Home Exercise Plan [id number]    Consulted and Agree with Plan of Care Patient             Patient  will benefit from skilled  therapeutic intervention in order to improve the following deficits and impairments:  Abnormal gait, Decreased activity tolerance, Decreased knowledge of use of DME, Decreased strength, Impaired UE functional use, Difficulty walking, Decreased balance  Visit Diagnosis: Muscle weakness  Balance problem  Other abnormalities of gait and mobility     Problem List Patient Active Problem List   Diagnosis Date Noted   Grief counseling 01/04/2021   Incontinence 12/23/2020   Hospital discharge follow-up 11/24/2020   Syncope 11/17/2020   Hypomagnesemia 11/05/2020   Hematemesis with nausea    Alcohol abuse with intoxication ([place])    Duodenal ulcer hemorrhagic    GI bleed 10/31/2020   Hyperglycemia 10/31/2020   DKA (diabetic ketoacidosis) ([place]) 07/22/2020   Candidiasis 07/22/2020   Hyponatremia 06/28/2020   Eyebrow laceration, left, initial encounter    Hypokalemia    Hyperlipidemia associated with type 2 diabetes mellitus (HCC)    MGUS (monoclonal gammopathy of unknown significance) 05/31/2020   Seizure disorder ([place]) 03/16/2020   Type 2 diabetes mellitus with hyperglycemia, with long-term current use of insulin ([place]) 03/16/2020   Tremor 03/16/2020   Colon cancer screening 02/22/2020   Stage 3a chronic kidney disease ([place]) 01/29/2020   Hyperphosphatemia 01/29/2020   Major depressive disorder, recurrent episode ([place]) 01/13/2018   Weakness    Hypotension 07/27/2017   Seizure ([place]) 04/08/2017   Proteinuria 11/23/2015   Essential hypertension    Cirrhosis ([place]) 03/22/2015   Ataxia 03/20/2015   Elevated liver enzymes    Dysarthria 03/09/2015   AKI (acute kidney injury) ([place])    Acute blood loss anemia 11/17/2014   ETOH abuse    Hyperkalemia    Tachycardia    Melena 10/10/2014   Erosive esophagitis 10/10/2014   Somnolence 10/08/2014   Acute kidney injury ([place])    OSA on CPAP 12/28/2012   Cardiac arrest ([place]) 12/07/2012   Anoxic brain injury ([place]) 12/07/2012   ERECTILE  DYSFUNCTION 04/14/2007   Type 2 diabetes mellitus with diabetic neuropathy, with long-term current use of insulin ([place]) 12/04/2006   HYPERCHOLESTEROLEMIA 12/04/2006    Shearon Balo PT, DPT 03/22/21 6:19 PM  [place] [facility] [address] [place], [place], [address] Phone: [phone]   Fax:  [phone]  Name: Jeffrey [name] MRN: [id number] Date of Birth: December 04, 1965

## 2021-03-27 ENCOUNTER — Ambulatory Visit: Payer: 59 | Admitting: Physical Therapy

## 2021-03-28 ENCOUNTER — Telehealth: Payer: Self-pay

## 2021-03-28 NOTE — Telephone Encounter (Signed)
Patient calls nurse line to check status of [employer] Short Term Disability paperwork. It was provided to PCP on 5/24.  Please advise status.   Talbot Grumbling, RN

## 2021-03-29 ENCOUNTER — Ambulatory Visit: Payer: 59 | Admitting: Neurology

## 2021-03-30 ENCOUNTER — Ambulatory Visit: Payer: 59 | Admitting: Physical Therapy

## 2021-04-02 NOTE — Telephone Encounter (Signed)
Patient called, LVM and informed that paperwork has been completed. Copy made and placed in batch scanning.   Talbot Grumbling, RN

## 2021-04-03 ENCOUNTER — Ambulatory Visit: Payer: 59 | Admitting: Physical Therapy

## 2021-04-05 ENCOUNTER — Ambulatory Visit: Payer: 59 | Admitting: Physical Therapy

## 2021-04-05 ENCOUNTER — Other Ambulatory Visit: Payer: Self-pay

## 2021-04-05 ENCOUNTER — Encounter: Payer: Self-pay | Admitting: Physical Therapy

## 2021-04-05 ENCOUNTER — Other Ambulatory Visit: Payer: Self-pay | Admitting: Family Medicine

## 2021-04-05 VITALS — HR 110

## 2021-04-05 DIAGNOSIS — M6281 Muscle weakness (generalized): Secondary | ICD-10-CM | POA: Diagnosis not present

## 2021-04-05 DIAGNOSIS — R2689 Other abnormalities of gait and mobility: Secondary | ICD-10-CM

## 2021-04-05 NOTE — Therapy (Signed)
[place], [place], [address] Phone: [phone]   Fax:  [phone]  Physical Therapy Treatment  Patient Details  Name: Jeffrey [name] MRN: [id number] Date of Birth: 08-07-1966 Referring Provider (PT): Leeanne Rio, MD   Encounter Date: 04/05/2021   PT End of Session - 04/05/21 1619     Visit Number 4    Number of Visits 16    Date for PT Re-Evaluation 04/25/21    Authorization - Number of Visits 30    PT Start Time 0415    PT Stop Time 0459    PT Time Calculation (min) 44 min    Equipment Utilized During Treatment Gait belt   pulse o2   Activity Tolerance Patient tolerated treatment well;Patient limited by fatigue    Behavior During Therapy [facility] for tasks assessed/performed             Past Medical History:  Diagnosis Date   Acute renal insufficiency 12/08/2012   AKI (acute kidney injury) ([place])    ALCOHOL ABUSE, HX OF 11/10/2007   Anoxic encephalopathy ([place])    Anxiety    Bleeding ulcer 2014   Blood transfusion 2014   "related to bleeding ulcer"   Cardiac arrest ([place]) 12/07/2012   Anoxic encephalopathy   GERD (gastroesophageal reflux disease)    High cholesterol    Hypertension    OSA on CPAP 12/07/2012   Seizure-like activity ([place])    Seizures ([place])    last seizure 01/05/2021   Type II diabetes mellitus ([place])    Venous insufficiency 12/13/2011    Past Surgical History:  Procedure Laterality Date   BIOPSY  11/01/2020   Procedure: BIOPSY;  Surgeon: Irene Shipper, MD;  Location: [facility] ENDOSCOPY;  Service: Endoscopy;;   CARDIOVASCULAR STRESS TEST  03/16/13   Very Poor Exercise Tolerance; NON DIAGNOSTIC TEST   CATARACT EXTRACTION W/ INTRAOCULAR LENS IMPLANT Left 03/07/2014   [place] @ [facility]   ESOPHAGOGASTRODUODENOSCOPY Left 11/26/2012   Procedure: ESOPHAGOGASTRODUODENOSCOPY (EGD);  Surgeon: Wonda Horner, MD;  Location: [facility] ENDOSCOPY;  Service: Endoscopy;  Laterality: Left;    ESOPHAGOGASTRODUODENOSCOPY N/A 10/10/2014   Procedure: ESOPHAGOGASTRODUODENOSCOPY (EGD);  Surgeon: Ladene Artist, MD;  Location: [facility] ENDOSCOPY;  Service: Endoscopy;  Laterality: N/A;   ESOPHAGOGASTRODUODENOSCOPY (EGD) WITH PROPOFOL N/A 11/01/2020   Procedure: ESOPHAGOGASTRODUODENOSCOPY (EGD) WITH PROPOFOL;  Surgeon: Irene Shipper, MD;  Location: [facility] ENDOSCOPY;  Service: Endoscopy;  Laterality: N/A;   KNEE ARTHROSCOPY Left ~ 1999   UPPER GASTROINTESTINAL ENDOSCOPY      Vitals:   04/05/21 1639  Pulse: (!) 110  SpO2: 100%     Subjective Assessment - 04/05/21 1623     Subjective Pt reports that is improving.  He feels more confident walking and with transfers.    Pertinent History DM, hx of cardiac arrest, HTN, seizures, FALL RISK    Limitations Lifting;Standing;Walking    Diagnostic tests NA    Patient Stated Goals regain strength    Currently in Pain? No/denies                               [facility] Adult PT Treatment/Exercise - 04/05/21 0001       Lumbar Exercises: Aerobic   Nustep 5 min L5 (LE only)      Lumbar Exercises: Machines for Strengthening   Leg Press 3x5 55#      Lumbar Exercises: Seated  Sit to Stand Limitations x5 w/ UE support      Knee/Hip Exercises: Standing   Other Standing Knee Exercises in // x20: march, HS curl, hip abd      Knee/Hip Exercises: Seated   Long Arc Quad Limitations 7.5# 3x10                      PT Short Term Goals - 02/28/21 1744       PT SHORT TERM GOAL #1   [name] will be >75% HEP compliant within 3 weeks to facilitate carryover between sessions and move towards eventual independent management of their condition.    Time 3    Period Weeks    Status New    Target Date 03/21/21               PT Long Term Goals - 02/28/21 1745       PT LONG TERM GOAL #1   Title Jeffrey [name] will improve 30'' STS (MCID 2) to >/= 10x to show improved LE strength and improved transfers.     Time 8    Period Weeks    Status New    Target Date 04/25/21      PT LONG TERM GOAL #2   Title Jeffrey [name] will improve gait speed to .9 m/s (.1 m/s MCID) to show functional improvement in ambulation    Baseline .57m/s    Time 8    Period Weeks    Status New    Target Date 04/25/21      PT LONG TERM GOAL #3   Title Jeffrey [name] will be able to stand for >30''' in tandem stance, to show a significant improvement in balance in order to reduce fall risk    Baseline unable    Time 8    Period Weeks    Status New    Target Date 04/25/21      PT LONG TERM GOAL #4   Title Jeffrey [name] will be able to transfer to and from toilet independently    Baseline unable    Time 8    Period Weeks    Status New    Target Date 04/25/21                   Plan - 04/05/21 1648     Clinical Impression Statement Pt reports no increase in baseline pain following therapy  HEP was reviewed with patient, but left unchanged    Overall, Jeffrey [name] is progressing well with therapy.  Today we concentrated on lower extremity strengthening and increasing activity tolerance.  Pt is able to tolerate additional standing exercise and is able to complete sit to stand transfers with reduced effort..  Pt will continue to benefit from skilled physical therapy to address remaining deficits and achieve listed goals.  Continue per POC.    PT Frequency 2x / week    PT Duration 8 weeks    PT Treatment/Interventions ADLs/Self Care Home Management;Therapeutic exercise;Neuromuscular re-education;Manual techniques;Gait training;Therapeutic activities;Dry needling    PT Next Visit Plan EVALUATE VITALS, ask about follow up regarding tachycardia, follow up on possible bedside commode    PT Home Exercise Plan [phone]    Consulted and Agree with Plan of Care Patient             Patient will benefit from skilled therapeutic intervention in order to improve the following deficits and  impairments:  Abnormal gait, Decreased activity tolerance, Decreased knowledge of use of DME, Decreased strength, Impaired UE functional use, Difficulty walking, Decreased balance  Visit Diagnosis: Muscle weakness  Balance problem     Problem List Patient Active Problem List   Diagnosis Date Noted   Grief counseling 01/04/2021   Incontinence 12/23/2020   Hospital discharge follow-up 11/24/2020   Syncope 11/17/2020   Hypomagnesemia 11/05/2020   Hematemesis with nausea    Alcohol abuse with intoxication ([place])    Duodenal ulcer hemorrhagic    GI bleed 10/31/2020   Hyperglycemia 10/31/2020   DKA (diabetic ketoacidosis) ([place]) 07/22/2020   Candidiasis 07/22/2020   Hyponatremia 06/28/2020   Eyebrow laceration, left, initial encounter    Hypokalemia    Hyperlipidemia associated with type 2 diabetes mellitus (HCC)    MGUS (monoclonal gammopathy of unknown significance) 05/31/2020   Seizure disorder ([place]) 03/16/2020   Type 2 diabetes mellitus with hyperglycemia, with long-term current use of insulin ([place]) 03/16/2020   Tremor 03/16/2020   Colon cancer screening 02/22/2020   Stage 3a chronic kidney disease ([place]) 01/29/2020   Hyperphosphatemia 01/29/2020   Major depressive disorder, recurrent episode ([place]) 01/13/2018   Weakness    Hypotension 07/27/2017   Seizure ([place]) 04/08/2017   Proteinuria 11/23/2015   Essential hypertension    Cirrhosis ([place]) 03/22/2015   Ataxia 03/20/2015   Elevated liver enzymes    Dysarthria 03/09/2015   AKI (acute kidney injury) ([place])    Acute blood loss anemia 11/17/2014   ETOH abuse    Hyperkalemia    Tachycardia    Melena 10/10/2014   Erosive esophagitis 10/10/2014   Somnolence 10/08/2014   Acute kidney injury ([place])    OSA on CPAP 12/28/2012   Cardiac arrest ([place]) 12/07/2012   Anoxic brain injury ([place]) 12/07/2012   ERECTILE DYSFUNCTION 04/14/2007   Type 2 diabetes mellitus with diabetic neuropathy, with long-term current use of insulin ([place])  12/04/2006   HYPERCHOLESTEROLEMIA 12/04/2006    Shearon Balo PT, DPT 04/05/21 [facility] [address] [place], [place], [address] Phone: [phone]   Fax:  [phone]  Name: Jeffrey [name] MRN: [id number] Date of Birth: 08-03-1966

## 2021-04-10 ENCOUNTER — Ambulatory Visit: Payer: 59 | Attending: Family Medicine

## 2021-04-10 ENCOUNTER — Other Ambulatory Visit: Payer: Self-pay

## 2021-04-10 VITALS — BP 132/96 | HR 108

## 2021-04-10 DIAGNOSIS — M6281 Muscle weakness (generalized): Secondary | ICD-10-CM | POA: Insufficient documentation

## 2021-04-10 DIAGNOSIS — R2689 Other abnormalities of gait and mobility: Secondary | ICD-10-CM | POA: Diagnosis present

## 2021-04-10 NOTE — Therapy (Signed)
[place], [place], [address] Phone: [phone]   Fax:  [phone]  Physical Therapy Treatment  Patient Details  Name: Jeffrey [name] MRN: [id number] Date of Birth: 1966/07/02 Referring Provider (PT): Leeanne Rio, MD   Encounter Date: 04/10/2021   PT End of Session - 04/10/21 1553     Visit [name] 5    [name] of Visits 16    Date for PT Re-Evaluation 04/25/21    Authorization - [name] of Visits 30    PT Start Time 6010    PT Stop Time 1630    PT Time Calculation (min) 42 min    Equipment Utilized During Treatment Gait belt    Activity Tolerance Patient tolerated treatment well    Behavior During Therapy [facility] for tasks assessed/performed             Past Medical History:  Diagnosis Date   Acute renal insufficiency 12/08/2012   AKI (acute kidney injury) ([place])    ALCOHOL ABUSE, HX OF 11/10/2007   Anoxic encephalopathy ([place])    Anxiety    Bleeding ulcer 2014   Blood transfusion 2014   "related to bleeding ulcer"   Cardiac arrest ([place]) 12/07/2012   Anoxic encephalopathy   GERD (gastroesophageal reflux disease)    High cholesterol    Hypertension    OSA on CPAP 12/07/2012   Seizure-like activity ([place])    Seizures ([place])    last seizure 01/05/2021   Type II diabetes mellitus ([place])    Venous insufficiency 12/13/2011    Past Surgical History:  Procedure Laterality Date   BIOPSY  11/01/2020   Procedure: BIOPSY;  Surgeon: Irene Shipper, MD;  Location: [facility];  Service: Endoscopy;;   CARDIOVASCULAR STRESS TEST  03/16/13   Very Poor Exercise Tolerance; NON DIAGNOSTIC TEST   CATARACT EXTRACTION W/ INTRAOCULAR LENS IMPLANT Left 03/07/2014   [place] @ [facility]   ESOPHAGOGASTRODUODENOSCOPY Left 11/26/2012   Procedure: ESOPHAGOGASTRODUODENOSCOPY (EGD);  Surgeon: Wonda Horner, MD;  Location: [facility] ENDOSCOPY;  Service: Endoscopy;  Laterality: Left;   ESOPHAGOGASTRODUODENOSCOPY N/A 10/10/2014    Procedure: ESOPHAGOGASTRODUODENOSCOPY (EGD);  Surgeon: Ladene Artist, MD;  Location: [facility] ENDOSCOPY;  Service: Endoscopy;  Laterality: N/A;   ESOPHAGOGASTRODUODENOSCOPY (EGD) WITH PROPOFOL N/A 11/01/2020   Procedure: ESOPHAGOGASTRODUODENOSCOPY (EGD) WITH PROPOFOL;  Surgeon: Irene Shipper, MD;  Location: [facility] ENDOSCOPY;  Service: Endoscopy;  Laterality: N/A;   KNEE ARTHROSCOPY Left ~ 1999   UPPER GASTROINTESTINAL ENDOSCOPY      Vitals:   04/10/21 1601  BP: (!) 132/96  Pulse: (!) 108  SpO2: 100%     Subjective Assessment - 04/10/21 1553     Subjective The patient reports that he will walk in the house without the cane.  When he goes out he will take the cane.    Pertinent History DM, hx of cardiac arrest, HTN, seizures, FALL RISK                [facility] PT Assessment - 04/10/21 0001       Assessment   Medical Diagnosis Weakness (R53.1)    Referring Provider (PT) Leeanne Rio, MD      Precautions   Precaution Comments DM, hx of cardiac arrest, HTN, seizures, FALL RISK, tachycardia, MONITOR VITALS                           [facility] Adult PT Treatment/Exercise - 04/10/21 0001  Lumbar Exercises: Aerobic   Nustep 5 min L3 (UE/LE)      Lumbar Exercises: Machines for Strengthening   Leg Press 3x5 55#      Lumbar Exercises: Seated   Sit to Stand Limitations 2 x5 w/ UE support      Knee/Hip Exercises: Standing   Hip Flexion Both;20 reps    Hip Flexion Limitations in // bars  UE support    Hip Abduction AROM;2 sets;Left;Right;10 reps    Other Standing Knee Exercises heel raises x 20 bilateral      Knee/Hip Exercises: Seated   Long Arc Quad Limitations 7.5# 2x10                      PT Short Term Goals - 02/28/21 1744       PT SHORT TERM GOAL #1   Jeffrey [name] will be >75% HEP compliant within 3 weeks to facilitate carryover between sessions and move towards eventual independent management of their condition.    Time 3     Period Weeks    Status New    Target Date 03/21/21               PT Long Term Goals - 02/28/21 1745       PT LONG TERM GOAL #1   Title Jeffrey [name] will improve 30'' STS (MCID 2) to >/= 10x to show improved LE strength and improved transfers.    Time 8    Period Weeks    Status New    Target Date 04/25/21      PT LONG TERM GOAL #2   Title Jeffrey [name] will improve gait speed to .9 m/s (.1 m/s MCID) to show functional improvement in ambulation    Baseline .22m/s    Time 8    Period Weeks    Status New    Target Date 04/25/21      PT LONG TERM GOAL #3   Title Jeffrey [name] will be able to stand for >30''' in tandem stance, to show a significant improvement in balance in order to reduce fall risk    Baseline unable    Time 8    Period Weeks    Status New    Target Date 04/25/21      PT LONG TERM GOAL #4   Title Jeffrey [name] will be able to transfer to and from toilet independently    Baseline unable    Time 8    Period Weeks    Status New    Target Date 04/25/21                   Plan - 04/10/21 1625     Clinical Impression Statement The patient reports that is able to walk in the house without his cane.  His MD has not called in the bedside commode yet.  The patient reports that he has not yet followed up with his heart MD.  He needs to make an appointment with them.  He will see his neurologist next month.  The patient did arrive walking with a quad cane.  He had an increase of BP and resting HR.  Continued with lower extremity strengthening in therapy today.  Possible balance activities next session.  Recommend therapy for increase of strengthening, endurance, and balance control.    Personal Factors and Comorbidities Comorbidity 3+    Comorbidities DM, hx of cardiac arrest, HTN, seizures, FALLS  Examination-Activity Limitations Bathing;Carry;Lift;Sit;Stand;Toileting    Examination-Participation Restrictions Church;Community Activity     PT Treatment/Interventions ADLs/Self Care Home Management;Therapeutic exercise;Neuromuscular re-education;Manual techniques;Gait training;Therapeutic activities;Dry needling    PT Next Visit Plan EVALUATE VITALS, ask about follow up regarding tachycardia, follow up on possible bedside commode, LE strengthening and balance exercises.    PT Home Exercise Plan [phone]    Consulted and Agree with Plan of Care Patient             Patient will benefit from skilled therapeutic intervention in order to improve the following deficits and impairments:  Abnormal gait, Decreased activity tolerance, Decreased knowledge of use of DME, Decreased strength, Impaired UE functional use, Difficulty walking, Decreased balance  Visit Diagnosis: Muscle weakness  Balance problem  Other abnormalities of gait and mobility     Problem List Patient Active Problem List   Diagnosis Date Noted   Grief counseling 01/04/2021   Incontinence 12/23/2020   Hospital discharge follow-up 11/24/2020   Syncope 11/17/2020   Hypomagnesemia 11/05/2020   Hematemesis with nausea    Alcohol abuse with intoxication ([place])    Duodenal ulcer hemorrhagic    GI bleed 10/31/2020   Hyperglycemia 10/31/2020   DKA (diabetic ketoacidosis) ([place]) 07/22/2020   Candidiasis 07/22/2020   Hyponatremia 06/28/2020   Eyebrow laceration, left, initial encounter    Hypokalemia    Hyperlipidemia associated with type 2 diabetes mellitus (HCC)    MGUS (monoclonal gammopathy of unknown significance) 05/31/2020   Seizure disorder ([place]) 03/16/2020   Type 2 diabetes mellitus with hyperglycemia, with long-term current use of insulin ([place]) 03/16/2020   Tremor 03/16/2020   Colon cancer screening 02/22/2020   Stage 3a chronic kidney disease ([place]) 01/29/2020   Hyperphosphatemia 01/29/2020   Major depressive disorder, recurrent episode ([place]) 01/13/2018   Weakness    Hypotension 07/27/2017   Seizure ([place]) 04/08/2017   Proteinuria 11/23/2015    Essential hypertension    Cirrhosis ([place]) 03/22/2015   Ataxia 03/20/2015   Elevated liver enzymes    Dysarthria 03/09/2015   AKI (acute kidney injury) ([place])    Acute blood loss anemia 11/17/2014   ETOH abuse    Hyperkalemia    Tachycardia    Melena 10/10/2014   Erosive esophagitis 10/10/2014   Somnolence 10/08/2014   Acute kidney injury ([place])    OSA on CPAP 12/28/2012   Cardiac arrest ([place]) 12/07/2012   Anoxic brain injury ([place]) 12/07/2012   ERECTILE DYSFUNCTION 04/14/2007   Type 2 diabetes mellitus with diabetic neuropathy, with long-term current use of insulin ([place]) 12/04/2006   HYPERCHOLESTEROLEMIA 12/04/2006   Jeffrey [name], PT, DPT, OCS, Crt. DN  Bethena Midget 04/10/2021, 4:31 PM  [facility] [address] [place], [place], [address] Phone: [phone]   Fax:  [phone]  Name: Jeffrey [name] MRN: [id number] Date of Birth: 05-31-1966

## 2021-04-12 ENCOUNTER — Other Ambulatory Visit: Payer: Self-pay

## 2021-04-12 ENCOUNTER — Ambulatory Visit: Payer: 59

## 2021-04-12 VITALS — HR 99

## 2021-04-12 DIAGNOSIS — M6281 Muscle weakness (generalized): Secondary | ICD-10-CM

## 2021-04-12 DIAGNOSIS — R2689 Other abnormalities of gait and mobility: Secondary | ICD-10-CM

## 2021-04-12 NOTE — Therapy (Signed)
[place], [place], [address] Phone: [phone]   Fax:  [phone]  Physical Therapy Treatment  Patient Details  Name: Jeffrey [name] MRN: [id number] Date of Birth: 1966-09-02 Referring Provider (PT): Leeanne Rio, MD   Encounter Date: 04/12/2021   PT End of Session - 04/12/21 1558     Visit Number 6    Number of Visits 16    Date for PT Re-Evaluation 04/25/21    PT Start Time 2671    PT Stop Time 1630    PT Time Calculation (min) 40 min    Activity Tolerance Patient tolerated treatment well;Patient limited by fatigue    Behavior During Therapy [facility] for tasks assessed/performed             Past Medical History:  Diagnosis Date   Acute renal insufficiency 12/08/2012   AKI (acute kidney injury) ([place])    ALCOHOL ABUSE, HX OF 11/10/2007   Anoxic encephalopathy ([place])    Anxiety    Bleeding ulcer 2014   Blood transfusion 2014   "related to bleeding ulcer"   Cardiac arrest ([place]) 12/07/2012   Anoxic encephalopathy   GERD (gastroesophageal reflux disease)    High cholesterol    Hypertension    OSA on CPAP 12/07/2012   Seizure-like activity ([place])    Seizures ([place])    last seizure 01/05/2021   Type II diabetes mellitus ([place])    Venous insufficiency 12/13/2011    Past Surgical History:  Procedure Laterality Date   BIOPSY  11/01/2020   Procedure: BIOPSY;  Surgeon: Irene Shipper, MD;  Location: [facility] ENDOSCOPY;  Service: Endoscopy;;   CARDIOVASCULAR STRESS TEST  03/16/13   Very Poor Exercise Tolerance; NON DIAGNOSTIC TEST   CATARACT EXTRACTION W/ INTRAOCULAR LENS IMPLANT Left 03/07/2014   [place] @ [facility]   ESOPHAGOGASTRODUODENOSCOPY Left 11/26/2012   Procedure: ESOPHAGOGASTRODUODENOSCOPY (EGD);  Surgeon: Wonda Horner, MD;  Location: [facility] ENDOSCOPY;  Service: Endoscopy;  Laterality: Left;   ESOPHAGOGASTRODUODENOSCOPY N/A 10/10/2014   Procedure: ESOPHAGOGASTRODUODENOSCOPY (EGD);  Surgeon:  Ladene Artist, MD;  Location: [facility] ENDOSCOPY;  Service: Endoscopy;  Laterality: N/A;   ESOPHAGOGASTRODUODENOSCOPY (EGD) WITH PROPOFOL N/A 11/01/2020   Procedure: ESOPHAGOGASTRODUODENOSCOPY (EGD) WITH PROPOFOL;  Surgeon: Irene Shipper, MD;  Location: [facility] ENDOSCOPY;  Service: Endoscopy;  Laterality: N/A;   KNEE ARTHROSCOPY Left ~ 1999   UPPER GASTROINTESTINAL ENDOSCOPY      Vitals:   04/12/21 1610  Pulse: 99  SpO2: 100%     Subjective Assessment - 04/12/21 1601     Subjective The ptaient reports that he is feeling better today compared to last session in therapy.                [facility] PT Assessment - 04/12/21 0001       Assessment   Medical Diagnosis Weakness (R53.1)    Referring Provider (PT) Ardelia Mems Delorse Limber, MD      Precautions   Precaution Comments DM, hx of cardiac arrest, HTN, seizures, FALL RISK, tachycardia, MONITOR VITALS                           [facility] Adult PT Treatment/Exercise - 04/12/21 0001       High Level Balance   High Level Balance Activities Side stepping;Tandem walking    High Level Balance Comments modified tandem  // bars   SBA     Lumbar Exercises: Aerobic  Nustep 5 min L6 (UE/LE)      Lumbar Exercises: Machines for Strengthening   Leg Press 3x5 55#      Knee/Hip Exercises: Standing   Heel Raises 20 reps    Hip Flexion Both;20 reps    Hip Flexion Limitations in // bars  UE support 2# ankle weight    Hip Abduction AROM;2 sets;Left;Right;10 reps    Abduction Limitations 2# ankle weight      Knee/Hip Exercises: Seated   Sit to Sand with UE support;5 reps;3 sets                      PT Short Term Goals - 02/28/21 1744       PT SHORT TERM GOAL #1   Title Jeffrey [name] will be >75% HEP compliant within 3 weeks to facilitate carryover between sessions and move towards eventual independent management of their condition.    Time 3    Period Weeks    Status New    Target Date 03/21/21                PT Long Term Goals - 02/28/21 1745       PT LONG TERM GOAL #1   Title Jeffrey [name] will improve 30'' STS (MCID 2) to >/= 10x to show improved LE strength and improved transfers.    Time 8    Period Weeks    Status New    Target Date 04/25/21      PT LONG TERM GOAL #2   Title Jeffrey [name] will improve gait speed to .9 m/s (.1 m/s MCID) to show functional improvement in ambulation    Baseline .61m/s    Time 8    Period Weeks    Status New    Target Date 04/25/21      PT LONG TERM GOAL #3   Title Jeffrey [name] will be able to stand for >30''' in tandem stance, to show a significant improvement in balance in order to reduce fall risk    Baseline unable    Time 8    Period Weeks    Status New    Target Date 04/25/21      PT LONG TERM GOAL #4   Title Jeffrey [name] will be able to transfer to and from toilet independently    Baseline unable    Time 8    Period Weeks    Status New    Target Date 04/25/21                   Plan - 04/12/21 1558     Clinical Impression Statement The patient arrived to therapy reporting that he was feeling good today.  He continues to use his quad cane for safety.  Progressed standing marches and hip abduction with 2# ankle weights.  The patient did get fatigued.  Added  balance training activities today with side stepping and modified walking in the parallel bars.  The patient was challenged with both of these exercises.   Jeffrey [name] does require assistance with lifting his legs onto the Nustep and leg press machines.  Recommend continued therapy for strengthening, endurance, and balance training.    Personal Factors and Comorbidities Comorbidity 3+    Comorbidities DM, hx of cardiac arrest, HTN, seizures, FALLS    Examination-Participation Restrictions Church;Community Activity    PT Treatment/Interventions ADLs/Self Care Home Management;Therapeutic exercise;Neuromuscular re-education;Manual techniques;Gait  training;Therapeutic activities;Dry needling  PT Next Visit Plan EVALUATE VITALS, ask about follow up regarding tachycardia, follow up on possible bedside commode, LE strengthening and balance exercises.    PT Home Exercise Plan [phone]    Consulted and Agree with Plan of Care Patient             Patient will benefit from skilled therapeutic intervention in order to improve the following deficits and impairments:  Abnormal gait, Decreased activity tolerance, Decreased knowledge of use of DME, Decreased strength, Impaired UE functional use, Difficulty walking, Decreased balance  Visit Diagnosis: Muscle weakness  Balance problem  Other abnormalities of gait and mobility     Problem List Patient Active Problem List   Diagnosis Date Noted   Grief counseling 01/04/2021   Incontinence 12/23/2020   Hospital discharge follow-up 11/24/2020   Syncope 11/17/2020   Hypomagnesemia 11/05/2020   Hematemesis with nausea    Alcohol abuse with intoxication ([place])    Duodenal ulcer hemorrhagic    GI bleed 10/31/2020   Hyperglycemia 10/31/2020   DKA (diabetic ketoacidosis) ([place]) 07/22/2020   Candidiasis 07/22/2020   Hyponatremia 06/28/2020   Eyebrow laceration, left, initial encounter    Hypokalemia    Hyperlipidemia associated with type 2 diabetes mellitus (HCC)    MGUS (monoclonal gammopathy of unknown significance) 05/31/2020   Seizure disorder ([place]) 03/16/2020   Type 2 diabetes mellitus with hyperglycemia, with long-term current use of insulin ([place]) 03/16/2020   Tremor 03/16/2020   Colon cancer screening 02/22/2020   Stage 3a chronic kidney disease ([place]) 01/29/2020   Hyperphosphatemia 01/29/2020   Major depressive disorder, recurrent episode ([place]) 01/13/2018   Weakness    Hypotension 07/27/2017   Seizure ([place]) 04/08/2017   Proteinuria 11/23/2015   Essential hypertension    Cirrhosis ([place]) 03/22/2015   Ataxia 03/20/2015   Elevated liver enzymes    Dysarthria 03/09/2015    AKI (acute kidney injury) ([place])    Acute blood loss anemia 11/17/2014   ETOH abuse    Hyperkalemia    Tachycardia    Melena 10/10/2014   Erosive esophagitis 10/10/2014   Somnolence 10/08/2014   Acute kidney injury ([place])    OSA on CPAP 12/28/2012   Cardiac arrest ([place]) 12/07/2012   Anoxic brain injury ([place]) 12/07/2012   ERECTILE DYSFUNCTION 04/14/2007   Type 2 diabetes mellitus with diabetic neuropathy, with long-term current use of insulin ([place]) 12/04/2006   HYPERCHOLESTEROLEMIA 12/04/2006   [place], PT, DPT, OCS, Crt. DN  Bethena Midget 04/12/2021, 4:41 PM  [facility] [address] [place], [place], [address] Phone: [phone]   Fax:  [phone]  Name: Jeffrey [name] MRN: [id number] Date of Birth: 1966-03-01

## 2021-04-17 ENCOUNTER — Ambulatory Visit: Payer: 59

## 2021-04-19 ENCOUNTER — Other Ambulatory Visit: Payer: Self-pay

## 2021-04-19 ENCOUNTER — Ambulatory Visit: Payer: 59

## 2021-04-19 DIAGNOSIS — M6281 Muscle weakness (generalized): Secondary | ICD-10-CM | POA: Diagnosis not present

## 2021-04-19 DIAGNOSIS — R2689 Other abnormalities of gait and mobility: Secondary | ICD-10-CM

## 2021-04-19 IMAGING — DX DG CHEST 1V PORT
1 series · 1 of 1 positions shown · non-contrast
Comparison: November 17, 2020

CLINICAL DATA: Question of CHF by report.

EXAM:
PORTABLE CHEST 1 VIEW

[chest ap]
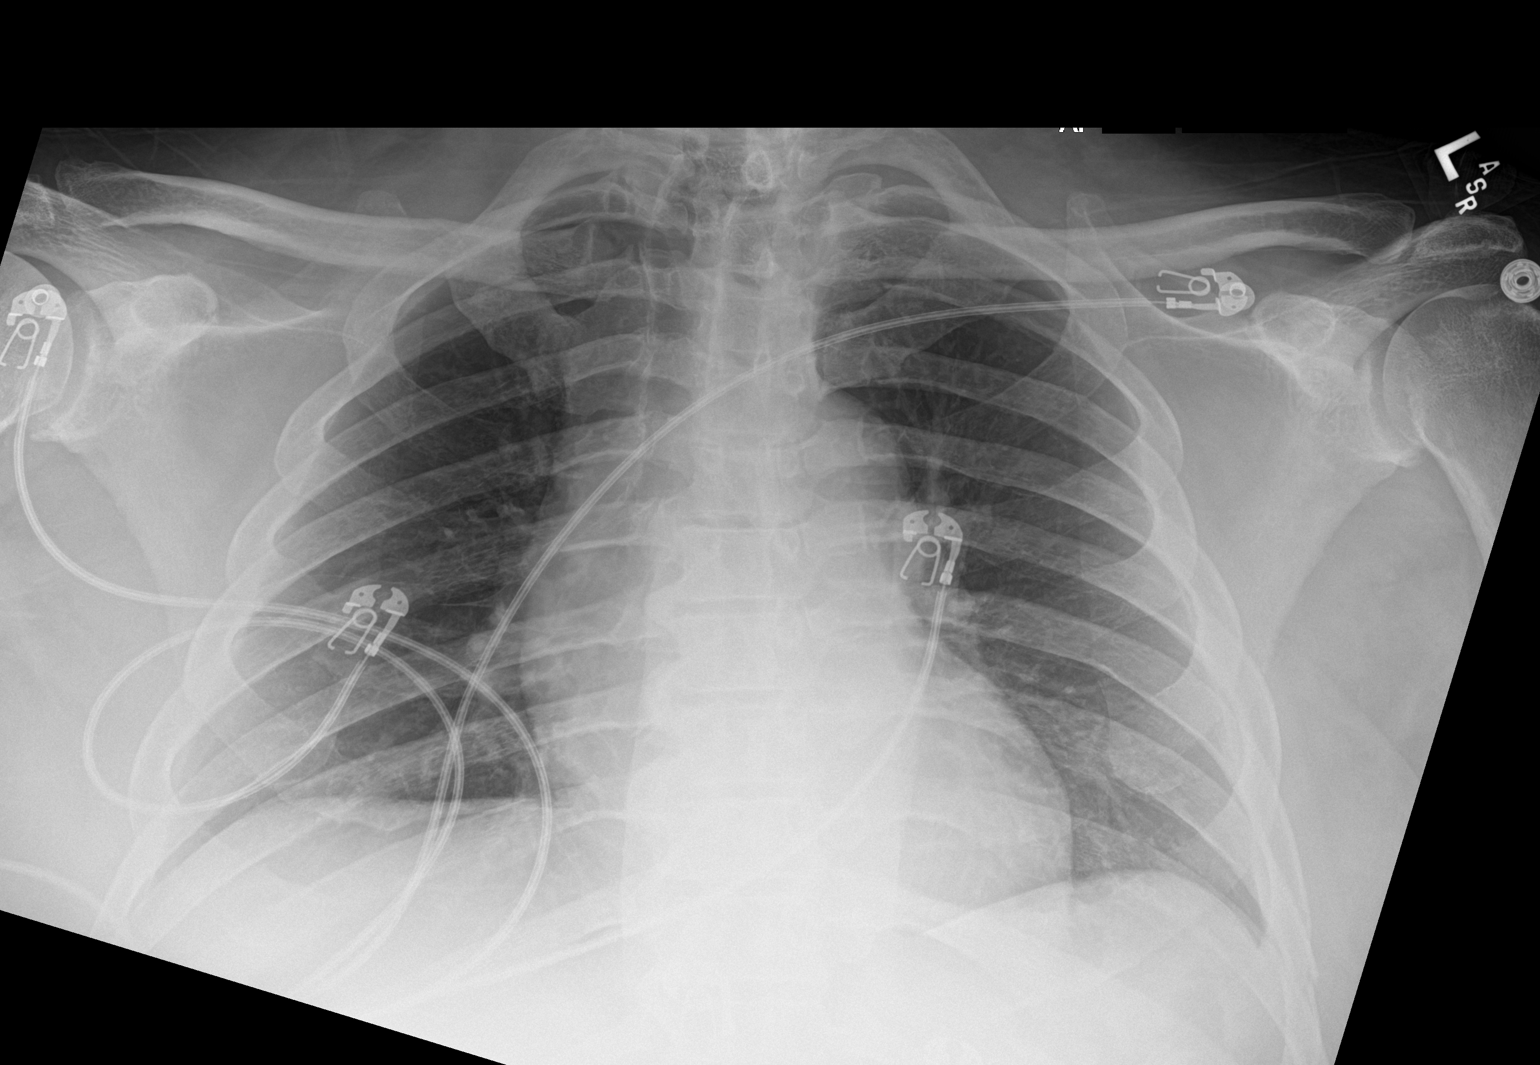

[1 of 1 positions shown; findings below may reference images not displayed]

FINDINGS: Image rotated to the RIGHT. Accounting for this cardiomediastinal
contours with stable mild enlargement and accentuation of central
pulmonary vascular structures. Lung volumes are low. Heart size
accentuated by portable technique.

No sign of consolidation. No effusion on frontal radiograph. No sign
of pulmonary edema. Cardiac leads project over the chest.

LEFT lateral eighth rib fracture with mild displacement. No visible
pneumothorax.
IMPRESSION: Low lung volumes. No acute cardiopulmonary disease.

Mildly displaced LEFT lateral rib fracture. Correlate with symptoms.

## 2021-04-19 NOTE — Therapy (Signed)
[place], [place], [address] Phone: [phone]   Fax:  [phone]  Physical Therapy Treatment  Patient Details  Name: Jeffrey [name] MRN: [id number] Date of Birth: [birth year] Referring Provider (PT): Leeanne Rio, MD   Encounter Date: 04/19/2021   PT End of Session - 04/19/21 1512     Visit Number 7    Number of Visits 16    Date for PT Re-Evaluation 04/25/21    Authorization - Number of Visits 30    PT Start Time 7253    PT Stop Time 1603    PT Time Calculation (min) 50 min    Equipment Utilized During Treatment Gait belt    Activity Tolerance Patient tolerated treatment well    Behavior During Therapy [facility] for tasks assessed/performed             Past Medical History:  Diagnosis Date   Acute renal insufficiency 12/08/2012   AKI (acute kidney injury) ([place])    ALCOHOL ABUSE, HX OF 11/10/2007   Anoxic encephalopathy ([place])    Anxiety    Bleeding ulcer 2014   Blood transfusion 2014   "related to bleeding ulcer"   Cardiac arrest ([place]) 12/07/2012   Anoxic encephalopathy   GERD (gastroesophageal reflux disease)    High cholesterol    Hypertension    OSA on CPAP 12/07/2012   Seizure-like activity ([place])    Seizures ([place])    last seizure 01/05/2021   Type II diabetes mellitus ([place])    Venous insufficiency 12/13/2011    Past Surgical History:  Procedure Laterality Date   BIOPSY  11/01/2020   Procedure: BIOPSY;  Surgeon: Irene Shipper, MD;  Location: [facility];  Service: Endoscopy;;   CARDIOVASCULAR STRESS TEST  03/16/13   Very Poor Exercise Tolerance; NON DIAGNOSTIC TEST   CATARACT EXTRACTION W/ INTRAOCULAR LENS IMPLANT Left 03/07/2014   [facility] @ [facility]   ESOPHAGOGASTRODUODENOSCOPY Left 11/26/2012   Procedure: ESOPHAGOGASTRODUODENOSCOPY (EGD);  Surgeon: Wonda Horner, MD;  Location: [facility] ENDOSCOPY;  Service: Endoscopy;  Laterality: Left;   ESOPHAGOGASTRODUODENOSCOPY N/A  10/10/2014   Procedure: ESOPHAGOGASTRODUODENOSCOPY (EGD);  Surgeon: Ladene Artist, MD;  Location: [facility] ENDOSCOPY;  Service: Endoscopy;  Laterality: N/A;   ESOPHAGOGASTRODUODENOSCOPY (EGD) WITH PROPOFOL N/A 11/01/2020   Procedure: ESOPHAGOGASTRODUODENOSCOPY (EGD) WITH PROPOFOL;  Surgeon: Irene Shipper, MD;  Location: [facility] ENDOSCOPY;  Service: Endoscopy;  Laterality: N/A;   KNEE ARTHROSCOPY Left ~ 1999   UPPER GASTROINTESTINAL ENDOSCOPY      There were no vitals filed for this visit.   Subjective Assessment - 04/19/21 1521     Subjective The patient reports that is doing pretty good today.  The patient reports that he did fall on Tuesday, but he was able to get up by himself due to his legs being stronger.  He would like to work on strengthening his arms as well.                [facility] PT Assessment - 04/19/21 0001       Assessment   Medical Diagnosis Weakness (R53.1)    Referring Provider (PT) Leeanne Rio, MD      Precautions   Precaution Comments DM, hx of cardiac arrest, HTN, seizures, FALL RISK, tachycardia, MONITOR VITALS                           [facility] Adult PT Treatment/Exercise - 04/19/21 0001  High Level Balance   High Level Balance Activities Side stepping;Backward walking   in // bars SBA   High Level Balance Comments modified tandem  // bars , heel walking, toe walking   SBA     Lumbar Exercises: Stretches   Other Lumbar Stretch Exercise gastro stretch 30"x 3      Lumbar Exercises: Aerobic   Nustep 5 min L8 (UE/LE)      Lumbar Exercises: Machines for Strengthening   Leg Press 3x5 60#      Lumbar Exercises: Standing   Heel Raises 20 reps    Heel Raises Limitations Toe raises x 20 reps      Knee/Hip Exercises: Standing   Heel Raises 20 reps    Hip Flexion Both;20 reps;2 sets    Hip Flexion Limitations in // bars  UE support 2# ankle weight    Hip Abduction AROM;2 sets;Left;Right;10 reps    Abduction Limitations 2# ankle weight       Knee/Hip Exercises: Seated   Sit to Sand with UE support;5 reps;3 sets                      PT Short Term Goals - 02/28/21 1744       PT SHORT TERM GOAL #1   Title [name] will be >75% HEP compliant within 3 weeks to facilitate carryover between sessions and move towards eventual independent management of their condition.    Time 3    Period Weeks    Status New    Target Date 03/21/21               PT Long Term Goals - 02/28/21 1745       PT LONG TERM GOAL #1   Title [name] will improve 30'' STS (MCID 2) to >/= 10x to show improved LE strength and improved transfers.    Time 8    Period Weeks    Status New    Target Date 04/25/21      PT LONG TERM GOAL #2   Title TEVEN MITTMAN will improve gait speed to .9 m/s (.1 m/s MCID) to show functional improvement in ambulation    Baseline .44m/s    Time 8    Period Weeks    Status New    Target Date 04/25/21      PT LONG TERM GOAL #3   Title ARTHER HEISLER will be able to stand for >30''' in tandem stance, to show a significant improvement in balance in order to reduce fall risk    Baseline unable    Time 8    Period Weeks    Status New    Target Date 04/25/21      PT LONG TERM GOAL #4   Title DOMIQUE CLAPPER will be able to transfer to and from toilet independently    Baseline unable    Time 8    Period Weeks    Status New    Target Date 04/25/21                   Plan - 04/19/21 1513     Clinical Impression Statement The patient reports that he fell on Tuesday on his way to the rest room.  He was however able to get up by himself.  The last few times he feel, he could not get up on his own due to the weakness.  He would like to work  on his UE strengthening as well.  Progressed balance training with heel/toe walking and backward walking.  The leg press was advanced to 60# bilaterally.  Possible unilateral leg press next session and UE strengthening.  Recommend  continued therapy for balance training for fall reduction and strengthening of UE/LE for transfers and mobility.    Personal Factors and Comorbidities Comorbidity 3+    Comorbidities DM, hx of cardiac arrest, HTN, seizures, FALLS    Examination-Participation Restrictions Church;Community Activity    PT Treatment/Interventions ADLs/Self Care Home Management;Therapeutic exercise;Neuromuscular re-education;Manual techniques;Gait training;Therapeutic activities;Dry needling    PT Next Visit Plan Progress note next session, EVALUATE VITALS, LE strengthening and balance exercises, UE strengthening for transfers and lifting.    PT Home Exercise Plan [phone]    Consulted and Agree with Plan of Care Patient             Patient will benefit from skilled therapeutic intervention in order to improve the following deficits and impairments:  Abnormal gait, Decreased activity tolerance, Decreased knowledge of use of DME, Decreased strength, Impaired UE functional use, Difficulty walking, Decreased balance  Visit Diagnosis: Muscle weakness  Balance problem  Other abnormalities of gait and mobility     Problem List Patient Active Problem List   Diagnosis Date Noted   Grief counseling 01/04/2021   Incontinence 12/23/2020   Hospital discharge follow-up 11/24/2020   Syncope 11/17/2020   Hypomagnesemia 11/05/2020   Hematemesis with nausea    Alcohol abuse with intoxication ([place])    Duodenal ulcer hemorrhagic    GI bleed 10/31/2020   Hyperglycemia 10/31/2020   DKA (diabetic ketoacidosis) ([place]) 07/22/2020   Candidiasis 07/22/2020   Hyponatremia 06/28/2020   Eyebrow laceration, left, initial encounter    Hypokalemia    Hyperlipidemia associated with type 2 diabetes mellitus (HCC)    MGUS (monoclonal gammopathy of unknown significance) 05/31/2020   Seizure disorder ([place]) 03/16/2020   Type 2 diabetes mellitus with hyperglycemia, with long-term current use of insulin ([place]) 03/16/2020   Tremor  03/16/2020   Colon cancer screening 02/22/2020   Stage 3a chronic kidney disease ([place]) 01/29/2020   Hyperphosphatemia 01/29/2020   Major depressive disorder, recurrent episode ([place]) 01/13/2018   Weakness    Hypotension 07/27/2017   Seizure ([place]) 04/08/2017   Proteinuria 11/23/2015   Essential hypertension    Cirrhosis ([place]) 03/22/2015   Ataxia 03/20/2015   Elevated liver enzymes    Dysarthria 03/09/2015   AKI (acute kidney injury) ([place])    Acute blood loss anemia 11/17/2014   ETOH abuse    Hyperkalemia    Tachycardia    Melena 10/10/2014   Erosive esophagitis 10/10/2014   Somnolence 10/08/2014   Acute kidney injury ([place])    OSA on CPAP 12/28/2012   Cardiac arrest ([place]) 12/07/2012   Anoxic brain injury ([place]) 12/07/2012   ERECTILE DYSFUNCTION 04/14/2007   Type 2 diabetes mellitus with diabetic neuropathy, with long-term current use of insulin ([place]) 12/04/2006   HYPERCHOLESTEROLEMIA 12/04/2006   Rich Number, PT, DPT, OCS, Crt. DN  Bethena Midget 04/19/2021, 4:10 PM  [facility] [address] [place], [place], [address] Phone: [phone]   Fax:  [phone]  Name: GLENDEL JAGGERS MRN: [id number] Date of Birth: Mar 02, 1966

## 2021-04-20 ENCOUNTER — Other Ambulatory Visit: Payer: Self-pay | Admitting: Family Medicine

## 2021-04-23 ENCOUNTER — Ambulatory Visit: Payer: 59

## 2021-05-01 ENCOUNTER — Encounter: Payer: Self-pay | Admitting: Physical Therapy

## 2021-05-01 ENCOUNTER — Other Ambulatory Visit: Payer: Self-pay

## 2021-05-01 ENCOUNTER — Ambulatory Visit: Payer: 59 | Admitting: Physical Therapy

## 2021-05-01 VITALS — HR 108

## 2021-05-01 DIAGNOSIS — R2689 Other abnormalities of gait and mobility: Secondary | ICD-10-CM

## 2021-05-01 DIAGNOSIS — M6281 Muscle weakness (generalized): Secondary | ICD-10-CM | POA: Diagnosis not present

## 2021-05-01 NOTE — Therapy (Signed)
[place], [place], [address] Phone: [phone]   Fax:  [phone]  Physical Therapy Treatment  Patient Details  Name: Jeffrey [name] MRN: [id number] Date of Birth: [birth year] Referring Provider (PT): Leeanne Rio, MD   Encounter Date: 05/01/2021   PT End of Session - 05/01/21 1532     Visit Number 8    Number of Visits 16    Date for PT Re-Evaluation 04/25/21    Authorization - Number of Visits 30    PT Start Time 2671    PT Stop Time 1815    PT Time Calculation (min) 165 min    Equipment Utilized During Treatment Gait belt    Activity Tolerance Patient tolerated treatment well    Behavior During Therapy [facility] for tasks assessed/performed             Past Medical History:  Diagnosis Date   Acute renal insufficiency 12/08/2012   AKI (acute kidney injury) ([place])    ALCOHOL ABUSE, HX OF 11/10/2007   Anoxic encephalopathy ([place])    Anxiety    Bleeding ulcer 2014   Blood transfusion 2014   "related to bleeding ulcer"   Cardiac arrest ([place]) 12/07/2012   Anoxic encephalopathy   GERD (gastroesophageal reflux disease)    High cholesterol    Hypertension    OSA on CPAP 12/07/2012   Seizure-like activity ([place])    Seizures ([place])    last seizure 01/05/2021   Type II diabetes mellitus ([place])    Venous insufficiency 12/13/2011    Past Surgical History:  Procedure Laterality Date   BIOPSY  11/01/2020   Procedure: BIOPSY;  Surgeon: Irene Shipper, MD;  Location: [facility];  Service: Endoscopy;;   CARDIOVASCULAR STRESS TEST  03/16/13   Very Poor Exercise Tolerance; NON DIAGNOSTIC TEST   CATARACT EXTRACTION W/ INTRAOCULAR LENS IMPLANT Left 03/07/2014   [place] @ [facility]   ESOPHAGOGASTRODUODENOSCOPY Left 11/26/2012   Procedure: ESOPHAGOGASTRODUODENOSCOPY (EGD);  Surgeon: Wonda Horner, MD;  Location: [facility] ENDOSCOPY;  Service: Endoscopy;  Laterality: Left;   ESOPHAGOGASTRODUODENOSCOPY N/A  10/10/2014   Procedure: ESOPHAGOGASTRODUODENOSCOPY (EGD);  Surgeon: Ladene Artist, MD;  Location: [facility] ENDOSCOPY;  Service: Endoscopy;  Laterality: N/A;   ESOPHAGOGASTRODUODENOSCOPY (EGD) WITH PROPOFOL N/A 11/01/2020   Procedure: ESOPHAGOGASTRODUODENOSCOPY (EGD) WITH PROPOFOL;  Surgeon: Irene Shipper, MD;  Location: [facility] ENDOSCOPY;  Service: Endoscopy;  Laterality: N/A;   KNEE ARTHROSCOPY Left ~ 1999   UPPER GASTROINTESTINAL ENDOSCOPY      Vitals:   05/01/21 1553  Pulse: (!) 108  SpO2: 100%     Subjective Assessment - 05/01/21 1541     Subjective Pt reports that he is feeling much better.  He feels his legs are much stronger.  He feels his arms are weak and he would like to work on UE strength.  To go back to work he needs to be able to move up and down from floor.  He denies pain    Pertinent History DM, hx of cardiac arrest, HTN, seizures, FALL RISK            [facility] Adult PT Treatment/Exercise:  Therapeutic Exercise: - Nu-step 5 min L7 while taking subjective -  leg press - 3x5 @ 5# -  Sit to stand with OH press - 2# - 62 cm table - biceps curl - 3x6 @ 8# - in parallel bars 1 lap each  - heel walking  - toe walking  -  walking march     PT Short Term Goals - 02/28/21 1744       PT SHORT TERM GOAL #1   Jeffrey [name] will be >75% HEP compliant within 3 weeks to facilitate carryover between sessions and move towards eventual independent management of their condition.    Time 3    Period Weeks    Status New    Target Date 03/21/21               PT Long Term Goals - 02/28/21 1745       PT LONG TERM GOAL #1   Jeffrey Jeffrey [name] will improve 30'' STS (MCID 2) to >/= 10x to show improved LE strength and improved transfers.    Time 8    Period Weeks    Status New    Target Date 04/25/21      PT LONG TERM GOAL #2   Jeffrey [name] will improve gait speed to .9 m/s (.1 m/s MCID) to show functional improvement in ambulation    Baseline .4m/s     Time 8    Period Weeks    Status New    Target Date 04/25/21      PT LONG TERM GOAL #3   Jeffrey Jeffrey [name] will be able to stand for >30''' in tandem stance, to show a significant improvement in balance in order to reduce fall risk    Baseline unable    Time 8    Period Weeks    Status New    Target Date 04/25/21      PT LONG TERM GOAL #4   Jeffrey Jeffrey [name] will be able to transfer to and from toilet independently    Baseline unable    Time 8    Period Weeks    Status New    Target Date 04/25/21                   Plan - 05/01/21 1558     Clinical Impression Statement Pt reports no increase in baseline pain following therapy  HEP was reviewed with patient, but left unchanged    Overall, Jeffrey [name] is progressing well with therapy.  Today we concentrated on lower extremity strengthening, balance/proprioception, and Upper extremity strength .  Pt has made significant progress but will continue to benefit from global strengthening and balance work.  He does report unsteadiness with backward walking in // and this was d/c (required minA to regain balance).  Pt will continue to benefit from skilled physical therapy to address remaining deficits and achieve listed goals.  Continue per POC.    Personal Factors and Comorbidities Comorbidity 3+    Comorbidities DM, hx of cardiac arrest, HTN, seizures, FALLS    Examination-Participation Restrictions Church;Community Activity    PT Treatment/Interventions ADLs/Self Care Home Management;Therapeutic exercise;Neuromuscular re-education;Manual techniques;Gait training;Therapeutic activities;Dry needling    PT Next Visit Plan Progress note next session, EVALUATE VITALS, LE strengthening and balance exercises, UE strengthening for transfers and lifting.    PT Home Exercise Plan [phone]    Consulted and Agree with Plan of Care Patient             Patient will benefit from skilled therapeutic intervention in order  to improve the following deficits and impairments:  Abnormal gait, Decreased activity tolerance, Decreased knowledge of use of DME, Decreased strength, Impaired UE functional use, Difficulty walking, Decreased balance  Visit Diagnosis: Muscle weakness  Balance  problem  Other abnormalities of gait and mobility     Problem List Patient Active Problem List   Diagnosis Date Noted   Grief counseling 01/04/2021   Incontinence 12/23/2020   Hospital discharge follow-up 11/24/2020   Syncope 11/17/2020   Hypomagnesemia 11/05/2020   Hematemesis with nausea    Alcohol abuse with intoxication ([place])    Duodenal ulcer hemorrhagic    GI bleed 10/31/2020   Hyperglycemia 10/31/2020   DKA (diabetic ketoacidosis) ([place]) 07/22/2020   Candidiasis 07/22/2020   Hyponatremia 06/28/2020   Eyebrow laceration, left, initial encounter    Hypokalemia    Hyperlipidemia associated with type 2 diabetes mellitus (HCC)    MGUS (monoclonal gammopathy of unknown significance) 05/31/2020   Seizure disorder ([place]) 03/16/2020   Type 2 diabetes mellitus with hyperglycemia, with long-term current use of insulin ([place]) 03/16/2020   Tremor 03/16/2020   Colon cancer screening 02/22/2020   Stage 3a chronic kidney disease ([place]) 01/29/2020   Hyperphosphatemia 01/29/2020   Major depressive disorder, recurrent episode ([place]) 01/13/2018   Weakness    Hypotension 07/27/2017   Seizure ([place]) 04/08/2017   Proteinuria 11/23/2015   Essential hypertension    Cirrhosis ([place]) 03/22/2015   Ataxia 03/20/2015   Elevated liver enzymes    Dysarthria 03/09/2015   AKI (acute kidney injury) ([place])    Acute blood loss anemia 11/17/2014   ETOH abuse    Hyperkalemia    Tachycardia    Melena 10/10/2014   Erosive esophagitis 10/10/2014   Somnolence 10/08/2014   Acute kidney injury ([place])    OSA on CPAP 12/28/2012   Cardiac arrest ([place]) 12/07/2012   Anoxic brain injury ([place]) 12/07/2012   ERECTILE DYSFUNCTION 04/14/2007   Type 2  diabetes mellitus with diabetic neuropathy, with long-term current use of insulin ([place]) 12/04/2006   HYPERCHOLESTEROLEMIA 12/04/2006    Shearon Balo PT, DPT 05/01/21 5:01 PM   [facility] [address] [place], [place], [address] Phone: [phone]   Fax:  [phone]  Name: DAYLIN GRUSZKA MRN: [id number] Date of Birth: 1966/04/09

## 2021-05-07 ENCOUNTER — Ambulatory Visit: Payer: 59

## 2021-05-10 ENCOUNTER — Ambulatory Visit: Payer: 59 | Attending: Family Medicine | Admitting: Physical Therapy

## 2021-05-10 ENCOUNTER — Encounter: Payer: Self-pay | Admitting: Physical Therapy

## 2021-05-10 ENCOUNTER — Other Ambulatory Visit: Payer: Self-pay

## 2021-05-10 VITALS — BP 116/81 | HR 107

## 2021-05-10 DIAGNOSIS — M6281 Muscle weakness (generalized): Secondary | ICD-10-CM

## 2021-05-10 DIAGNOSIS — R2689 Other abnormalities of gait and mobility: Secondary | ICD-10-CM

## 2021-05-10 NOTE — Therapy (Signed)
[place], [place], [address] Phone: [phone]   Fax:  [phone]  Physical Therapy Treatment  Patient Details  Name: Jeffrey [name] MRN: [id number] Date of Birth: November 13, 1965 Referring Provider (PT): Leeanne Rio, MD   Encounter Date: 05/10/2021   PT End of Session - 05/10/21 1511     Visit Number 9    Number of Visits 16    Date for PT Re-Evaluation 07/06/21    Authorization - Number of Visits 30    PT Start Time 3704    PT Stop Time 1552    PT Time Calculation (min) 42 min    Equipment Utilized During Treatment Gait belt    Activity Tolerance Patient tolerated treatment well    Behavior During Therapy [facility] for tasks assessed/performed             Past Medical History:  Diagnosis Date   Acute renal insufficiency 12/08/2012   AKI (acute kidney injury) ([place])    ALCOHOL ABUSE, HX OF 11/10/2007   Anoxic encephalopathy ([place])    Anxiety    Bleeding ulcer 2014   Blood transfusion 2014   "related to bleeding ulcer"   Cardiac arrest ([place]) 12/07/2012   Anoxic encephalopathy   GERD (gastroesophageal reflux disease)    High cholesterol    Hypertension    OSA on CPAP 12/07/2012   Seizure-like activity ([place])    Seizures ([place])    last seizure 01/05/2021   Type II diabetes mellitus ([place])    Venous insufficiency 12/13/2011    Past Surgical History:  Procedure Laterality Date   BIOPSY  11/01/2020   Procedure: BIOPSY;  Surgeon: Irene Shipper, MD;  Location: [place];  Service: Endoscopy;;   CARDIOVASCULAR STRESS TEST  03/16/13   Very Poor Exercise Tolerance; NON DIAGNOSTIC TEST   CATARACT EXTRACTION W/ INTRAOCULAR LENS IMPLANT Left 03/07/2014   [place] @ [facility]   ESOPHAGOGASTRODUODENOSCOPY Left 11/26/2012   Procedure: ESOPHAGOGASTRODUODENOSCOPY (EGD);  Surgeon: Wonda Horner, MD;  Location: [facility] ENDOSCOPY;  Service: Endoscopy;  Laterality: Left;   ESOPHAGOGASTRODUODENOSCOPY N/A 10/10/2014    Procedure: ESOPHAGOGASTRODUODENOSCOPY (EGD);  Surgeon: Ladene Artist, MD;  Location: [facility] ENDOSCOPY;  Service: Endoscopy;  Laterality: N/A;   ESOPHAGOGASTRODUODENOSCOPY (EGD) WITH PROPOFOL N/A 11/01/2020   Procedure: ESOPHAGOGASTRODUODENOSCOPY (EGD) WITH PROPOFOL;  Surgeon: Irene Shipper, MD;  Location[facility] ENDOSCOPY;  Service: Endoscopy;  Laterality: N/A;   KNEE ARTHROSCOPY Left ~ 1999   UPPER GASTROINTESTINAL ENDOSCOPY      Vitals:   05/10/21 1524  BP: 116/81  Pulse: (!) 107  SpO2: 98%     Subjective Assessment - 05/10/21 1518     Subjective Pt reports that he continues to improve.  He feels he is generally weak in his UEs and would like to encorporate some UE exercises.  0/10 pain today    Pertinent History DM, hx of cardiac arrest, HTN, seizures, FALL RISK             Therapeutic Exercise: - Nu-step 5 min L8 while taking subjective - row with abdominal contraction - 20# 3x10 - Shoulder ext with abdominal contraction - 17# 2x10 - Standing march 2# - 2x10 ea - Standing hip abd 2# - 2x5 ea -  Sit to stand with OH press - 2# - 62 cm table 2 sets of 3 -  leg press - 3x5 @ 85# - biceps curl - 2x10 ea @ 8#  NOT TODAY: - in parallel  bars 1 lap each             - heel walking             - toe walking             - walking march     PT Short Term Goals - 02/28/21 1744       PT SHORT TERM GOAL #1   Title Jeffrey [name] will be >75% HEP compliant within 3 weeks to facilitate carryover between sessions and move towards eventual independent management of their condition.    Time 3    Period Weeks    Status New    Target Date 03/21/21               PT Long Term Goals - 05/01/21 1705       PT LONG TERM GOAL #1   Jeffrey [name] will improve 30'' STS (MCID 2) to >/= 10x to show improved LE strength and improved transfers.    Time 8    Period Weeks    Status New    Target Date 07/06/21      PT LONG TERM GOAL #2   Title Jeffrey [name] will improve  gait speed to .9 m/s (.1 m/s MCID) to show functional improvement in ambulation    Baseline .23m/s    Time 8    Period Weeks    Status New    Target Date 07/06/21      PT LONG TERM GOAL #3   Title Jeffrey [name] will be able to stand for >30''' in tandem stance, to show a significant improvement in balance in order to reduce fall risk    Baseline unable    Time 8    Period Weeks    Status New    Target Date 07/06/21      PT LONG TERM GOAL #4   Title Jeffrey [name] will be able to transfer to and from toilet independently    Baseline unable    Time 8    Period Weeks    Status New    Target Date 07/06/21                   Plan - 05/10/21 1548     Clinical Impression Statement Pt reports no increase in baseline pain following therapy  HEP was not updated as current program meets patient's needs    Overall, Jeffrey [name] is progressing well with therapy.  Today we concentrated on core strengthening, lower extremity strengthening, and increasing activity tolerance.  Pt continues to improve LE strength.  He is more steady in gait.  We are working to incorporate full body exercises to improve overall function.  Pt will continue to benefit from skilled physical therapy to address remaining deficits and achieve listed goals.  Continue per POC.    Personal Factors and Comorbidities Comorbidity 3+    Comorbidities DM, hx of cardiac arrest, HTN, seizures, FALLS    Examination-Participation Restrictions Church;Community Activity    PT Treatment/Interventions ADLs/Self Care Home Management;Therapeutic exercise;Neuromuscular re-education;Manual techniques;Gait training;Therapeutic activities;Dry needling    PT Next Visit Plan Progress note next session, EVALUATE VITALS, LE strengthening and balance exercises, UE strengthening for transfers and lifting.    PT Home Exercise Plan [phone]    Consulted and Agree with Plan of Care Patient             Patient will benefit  from skilled  therapeutic intervention in order to improve the following deficits and impairments:  Abnormal gait, Decreased activity tolerance, Decreased knowledge of use of DME, Decreased strength, Impaired UE functional use, Difficulty walking, Decreased balance  Visit Diagnosis: Muscle weakness  Balance problem  Other abnormalities of gait and mobility     Problem List Patient Active Problem List   Diagnosis Date Noted   Grief counseling 01/04/2021   Incontinence 12/23/2020   Hospital discharge follow-up 11/24/2020   Syncope 11/17/2020   Hypomagnesemia 11/05/2020   Hematemesis with nausea    Alcohol abuse with intoxication ([place])    Duodenal ulcer hemorrhagic    GI bleed 10/31/2020   Hyperglycemia 10/31/2020   DKA (diabetic ketoacidosis) ([place]) 07/22/2020   Candidiasis 07/22/2020   Hyponatremia 06/28/2020   Eyebrow laceration, left, initial encounter    Hypokalemia    Hyperlipidemia associated with type 2 diabetes mellitus (HCC)    MGUS (monoclonal gammopathy of unknown significance) 05/31/2020   Seizure disorder ([place]) 03/16/2020   Type 2 diabetes mellitus with hyperglycemia, with long-term current use of insulin ([place]) 03/16/2020   Tremor 03/16/2020   Colon cancer screening 02/22/2020   Stage 3a chronic kidney disease ([place]) 01/29/2020   Hyperphosphatemia 01/29/2020   Major depressive disorder, recurrent episode ([place]) 01/13/2018   Weakness    Hypotension 07/27/2017   Seizure ([place]) 04/08/2017   Proteinuria 11/23/2015   Essential hypertension    Cirrhosis ([place]) 03/22/2015   Ataxia 03/20/2015   Elevated liver enzymes    Dysarthria 03/09/2015   AKI (acute kidney injury) ([place])    Acute blood loss anemia 11/17/2014   ETOH abuse    Hyperkalemia    Tachycardia    Melena 10/10/2014   Erosive esophagitis 10/10/2014   Somnolence 10/08/2014   Acute kidney injury ([place])    OSA on CPAP 12/28/2012   Cardiac arrest ([place]) 12/07/2012   Anoxic brain injury ([place]) 12/07/2012    ERECTILE DYSFUNCTION 04/14/2007   Type 2 diabetes mellitus with diabetic neuropathy, with long-term current use of insulin ([place]) 12/04/2006   HYPERCHOLESTEROLEMIA 12/04/2006    Shearon Balo PT, DPT 05/10/21 4:02 PM  [facility] [address] [place], [place], [address] Phone: [phone]   Fax:  [phone]  Name: Jeffrey [name] MRN: [id number] Date of Birth: June 16, 1966

## 2021-05-15 ENCOUNTER — Telehealth: Payer: Self-pay | Admitting: Physical Therapy

## 2021-05-15 ENCOUNTER — Encounter: Payer: 59 | Admitting: Physical Therapy

## 2021-05-15 NOTE — Telephone Encounter (Signed)
Called and informed patient of missed visit and provided reminder of next appt and attendance policy.  

## 2021-05-17 ENCOUNTER — Ambulatory Visit: Payer: 59 | Admitting: Physical Therapy

## 2021-05-17 ENCOUNTER — Other Ambulatory Visit: Payer: Self-pay

## 2021-05-17 ENCOUNTER — Encounter: Payer: Self-pay | Admitting: Physical Therapy

## 2021-05-17 ENCOUNTER — Other Ambulatory Visit: Payer: Self-pay | Admitting: Family Medicine

## 2021-05-17 DIAGNOSIS — M6281 Muscle weakness (generalized): Secondary | ICD-10-CM | POA: Diagnosis not present

## 2021-05-17 DIAGNOSIS — R2689 Other abnormalities of gait and mobility: Secondary | ICD-10-CM

## 2021-05-17 NOTE — Therapy (Signed)
[place], [place], [address] Phone: [phone]   Fax:  [phone]  Physical Therapy Treatment  Patient Details  Name: Jeffrey [name] MRN: [id number] Date of Birth: September 02, 1966 Referring Provider (PT): Jeffrey Rio, MD   Encounter Date: 05/17/2021   PT End of Session - 05/17/21 1709     Visit Number 10    Number of Visits 16    Date for PT Re-Evaluation 07/06/21    Authorization - Number of Visits 30    PT Start Time 0330    PT Stop Time 9326    PT Time Calculation (min) 45 min    Equipment Utilized During Treatment Gait belt    Activity Tolerance Patient tolerated treatment well    Behavior During Therapy [facility] for tasks assessed/performed             Past Medical History:  Diagnosis Date   Acute renal insufficiency 12/08/2012   AKI (acute kidney injury) ([place])    ALCOHOL ABUSE, HX OF 11/10/2007   Anoxic encephalopathy ([place])    Anxiety    Bleeding ulcer 2014   Blood transfusion 2014   "related to bleeding ulcer"   Cardiac arrest ([place]) 12/07/2012   Anoxic encephalopathy   GERD (gastroesophageal reflux disease)    High cholesterol    Hypertension    OSA on CPAP 12/07/2012   Seizure-like activity ([place])    Seizures ([place])    last seizure 01/05/2021   Type II diabetes mellitus ([place])    Venous insufficiency 12/13/2011    Past Surgical History:  Procedure Laterality Date   BIOPSY  11/01/2020   Procedure: BIOPSY;  Surgeon: Jeffrey Shipper, MD;  Location: [place];  Service: Endoscopy;;   CARDIOVASCULAR STRESS TEST  03/16/13   Very Poor Exercise Tolerance; NON DIAGNOSTIC TEST   CATARACT EXTRACTION W/ INTRAOCULAR LENS IMPLANT Left 03/07/2014   [place] @ [facility]   ESOPHAGOGASTRODUODENOSCOPY Left 11/26/2012   Procedure: ESOPHAGOGASTRODUODENOSCOPY (EGD);  Surgeon: Jeffrey Horner, MD;  Location: [facility] ENDOSCOPY;  Service: Endoscopy;  Laterality: Left;   ESOPHAGOGASTRODUODENOSCOPY N/A  10/10/2014   Procedure: ESOPHAGOGASTRODUODENOSCOPY (EGD);  Surgeon: Jeffrey Artist, MD;  Location: [facility] ENDOSCOPY;  Service: Endoscopy;  Laterality: N/A;   ESOPHAGOGASTRODUODENOSCOPY (EGD) WITH PROPOFOL N/A 11/01/2020   Procedure: ESOPHAGOGASTRODUODENOSCOPY (EGD) WITH PROPOFOL;  Surgeon: Jeffrey Shipper, MD;  Location: [facility] ENDOSCOPY;  Service: Endoscopy;  Laterality: N/A;   KNEE ARTHROSCOPY Left ~ 1999   UPPER GASTROINTESTINAL ENDOSCOPY      There were no vitals filed for this visit.   Subjective Assessment - 05/17/21 1529     Subjective Pt reports he is gradually getting stronger.  He recently got some weights at home and has been working on his upper body strength.  He has 0/10 pain today.    Pertinent History DM, hx of cardiac arrest, HTN, seizures, FALL RISK             02 sat 100, HR 115 at rest  Therapeutic Exercise: - Nu-step 5 min L8 while taking subjective - [place] walking in // - 6 laps with 1 seated rest break - row with abdominal contraction - 20# - 3x10 - walkout - 20# - 4 times - cable machine - ball toss for core activation - seated - yellow ball - chest pass 2x8, OH reverse throw 8x  NOT TODAY:  - Shoulder ext with abdominal contraction - 17# 2x10 (not today) -  Sit to stand with  OH press - 2# - 62 cm table 2 sets of 3 -  leg press - 3x5 @ 85# - biceps curl - 2x10 ea @ 8# - in parallel bars 1 lap each             - heel walking             - toe walking             - walking march    PT Short Term Goals - 05/17/21 1534       PT SHORT TERM GOAL #1   [name] will be >75% HEP compliant within 3 weeks to facilitate carryover between sessions and move towards eventual independent management of their condition.    Time 3    Period Weeks    Status Achieved    Target Date 03/21/21               PT Long Term Goals - 05/01/21 1705       PT LONG TERM GOAL #1   [name] will improve 30'' STS (MCID 2) to >/= 10x to show improved  LE strength and improved transfers.    Time 8    Period Weeks    Status New    Target Date 07/06/21      PT LONG TERM GOAL #2   Title Jeffrey [name] will improve gait speed to .9 m/s (.1 m/s MCID) to show functional improvement in ambulation    Baseline .6m/s    Time 8    Period Weeks    Status New    Target Date 07/06/21      PT LONG TERM GOAL #3   Title Jeffrey [name] will be able to stand for >30''' in tandem stance, to show a significant improvement in balance in order to reduce fall risk    Baseline unable    Time 8    Period Weeks    Status New    Target Date 07/06/21      PT LONG TERM GOAL #4   Title Jeffrey [name] will be able to transfer to and from toilet independently    Baseline unable    Time 8    Period Weeks    Status New    Target Date 07/06/21                   Plan - 05/17/21 1708     Clinical Impression Statement Pt reports no increase in baseline pain following therapy  HEP was reviewed, but left unchanged    Overall, Jeffrey [name] is progressing well with therapy.  Today we concentrated on more global and functional strength.  Pt struggles with SLS during hurdle walking with significant core and hip weakness leading to instability.  Pt tolerates ball toss for core activation well; cued for good posture during throws.  Pt will continue to benefit from skilled physical therapy to address remaining deficits and achieve listed goals.  Continue per POC.    Personal Factors and Comorbidities Comorbidity 3+    Comorbidities DM, hx of cardiac arrest, HTN, seizures, FALLS    Examination-Participation Restrictions Church;Community Activity    PT Treatment/Interventions ADLs/Self Care Home Management;Therapeutic exercise;Neuromuscular re-education;Manual techniques;Gait training;Therapeutic activities;Dry needling    PT Next Visit Plan Progress note next session, EVALUATE VITALS, LE strengthening and balance exercises, UE strengthening for  transfers and lifting.    PT Home Exercise Plan [phone]  Consulted and Agree with Plan of Care Patient             Patient will benefit from skilled therapeutic intervention in order to improve the following deficits and impairments:  Abnormal gait, Decreased activity tolerance, Decreased knowledge of use of DME, Decreased strength, Impaired UE functional use, Difficulty walking, Decreased balance  Visit Diagnosis: Muscle weakness  Balance problem  Other abnormalities of gait and mobility     Problem List Patient Active Problem List   Diagnosis Date Noted   Grief counseling 01/04/2021   Incontinence 12/23/2020   Hospital discharge follow-up 11/24/2020   Syncope 11/17/2020   Hypomagnesemia 11/05/2020   Hematemesis with nausea    Alcohol abuse with intoxication ([place])    Duodenal ulcer hemorrhagic    GI bleed 10/31/2020   Hyperglycemia 10/31/2020   DKA (diabetic ketoacidosis) ([place]) 07/22/2020   Candidiasis 07/22/2020   Hyponatremia 06/28/2020   Eyebrow laceration, left, initial encounter    Hypokalemia    Hyperlipidemia associated with type 2 diabetes mellitus (HCC)    MGUS (monoclonal gammopathy of unknown significance) 05/31/2020   Seizure disorder ([place]) 03/16/2020   Type 2 diabetes mellitus with hyperglycemia, with long-term current use of insulin ([place]) 03/16/2020   Tremor 03/16/2020   Colon cancer screening 02/22/2020   Stage 3a chronic kidney disease ([place]) 01/29/2020   Hyperphosphatemia 01/29/2020   Major depressive disorder, recurrent episode ([place]) 01/13/2018   Weakness    Hypotension 07/27/2017   Seizure ([place]) 04/08/2017   Proteinuria 11/23/2015   Essential hypertension    Cirrhosis ([place]) 03/22/2015   Ataxia 03/20/2015   Elevated liver enzymes    Dysarthria 03/09/2015   AKI (acute kidney injury) ([place])    Acute blood loss anemia 11/17/2014   ETOH abuse    Hyperkalemia    Tachycardia    Melena 10/10/2014   Erosive esophagitis 10/10/2014    Somnolence 10/08/2014   Acute kidney injury ([place])    OSA on CPAP 12/28/2012   Cardiac arrest ([place]) 12/07/2012   Anoxic brain injury ([place]) 12/07/2012   ERECTILE DYSFUNCTION 04/14/2007   Type 2 diabetes mellitus with diabetic neuropathy, with long-term current use of insulin ([place]) 12/04/2006   HYPERCHOLESTEROLEMIA 12/04/2006    Shearon Balo PT, DPT 05/17/21 5:09 PM  [facility] [address] [place], [place], [address] Phone: [phone]   Fax:  [phone]  Name: Jeffrey [name] MRN: [id number] Date of Birth: 1965-10-11

## 2021-05-22 ENCOUNTER — Ambulatory Visit: Payer: 59 | Admitting: Physical Therapy

## 2021-05-22 ENCOUNTER — Encounter: Payer: Self-pay | Admitting: Physical Therapy

## 2021-05-22 ENCOUNTER — Other Ambulatory Visit: Payer: Self-pay

## 2021-05-22 DIAGNOSIS — M6281 Muscle weakness (generalized): Secondary | ICD-10-CM | POA: Diagnosis not present

## 2021-05-22 DIAGNOSIS — R2689 Other abnormalities of gait and mobility: Secondary | ICD-10-CM

## 2021-05-22 NOTE — Therapy (Signed)
[place], [place], [address] Phone: [phone]   Fax:  [phone]  Physical Therapy Treatment  Patient Details  Name: Jeffrey [name] MRN: [id number] Date of Birth: 11-23-1965 Referring Provider (PT): Leeanne Rio, MD   Encounter Date: 05/22/2021   PT End of Session - 05/22/21 1530     Visit Number 11    Number of Visits 16    Date for PT Re-Evaluation 07/06/21    Authorization - Number of Visits 30    PT Start Time 9357    PT Stop Time 0177    PT Time Calculation (min) 44 min    Equipment Utilized During Treatment Gait belt    Activity Tolerance Patient tolerated treatment well    Behavior During Therapy [facility] for tasks assessed/performed             Past Medical History:  Diagnosis Date   Acute renal insufficiency 12/08/2012   AKI (acute kidney injury) ([place])    ALCOHOL ABUSE, HX OF 11/10/2007   Anoxic encephalopathy ([place])    Anxiety    Bleeding ulcer 2014   Blood transfusion 2014   "related to bleeding ulcer"   Cardiac arrest ([place]) 12/07/2012   Anoxic encephalopathy   GERD (gastroesophageal reflux disease)    High cholesterol    Hypertension    OSA on CPAP 12/07/2012   Seizure-like activity ([place])    Seizures ([place])    last seizure 01/05/2021   Type II diabetes mellitus ([place])    Venous insufficiency 12/13/2011    Past Surgical History:  Procedure Laterality Date   BIOPSY  11/01/2020   Procedure: BIOPSY;  Surgeon: Irene Shipper, MD;  Location: [place];  Service: Endoscopy;;   CARDIOVASCULAR STRESS TEST  03/16/13   Very Poor Exercise Tolerance; NON DIAGNOSTIC TEST   CATARACT EXTRACTION W/ INTRAOCULAR LENS IMPLANT Left 03/07/2014   [place] @ [facility]   ESOPHAGOGASTRODUODENOSCOPY Left 11/26/2012   Procedure: ESOPHAGOGASTRODUODENOSCOPY (EGD);  Surgeon: Wonda Horner, MD;  Location[facility]COPY;  Service: Endoscopy;  Laterality: Left;   ESOPHAGOGASTRODUODENOSCOPY N/A  10/10/2014   Procedure: ESOPHAGOGASTRODUODENOSCOPY (EGD);  Surgeon: Ladene Artist, MD;  Location: [facility] ENDOSCOPY;  Service: Endoscopy;  Laterality: N/A;   ESOPHAGOGASTRODUODENOSCOPY (EGD) WITH PROPOFOL N/A 11/01/2020   Procedure: ESOPHAGOGASTRODUODENOSCOPY (EGD) WITH PROPOFOL;  Surgeon: Irene Shipper, MD;  Location: [facility] ENDOSCOPY;  Service: Endoscopy;  Laterality: N/A;   KNEE ARTHROSCOPY Left ~ 1999   UPPER GASTROINTESTINAL ENDOSCOPY      There were no vitals filed for this visit.   Subjective Assessment - 05/22/21 1536     Subjective Pt reports he felt quite tired after last session.  He felt like his core, legs, and arms wore worked well.  He has been HEP compliant.  He has 0/10 pain today.    Pertinent History DM, hx of cardiac arrest, HTN, seizures, FALL RISK            02 sat 98 HR 114 at rest  Therapeutic Exercise: - Nu-step 5 min L8 while taking subjective - Hurdle walking in // - 6 laps with 1 seated rest break (not today) - row with abdominal contraction - 23# - 3x10 - walkout - 23# - 4 times - cable machine (holding handles) - ball toss for core activation - seated - blue ball - chest pass 2x8, OH reverse throw 2x8 - Quadruped UE lifts then LE lifts (3x2 ea)  Therapeutic Activity: Collecting information and  reviewing goals with pt     PT Short Term Goals - 05/17/21 1534       PT SHORT TERM GOAL #1   [place] will be >75% HEP compliant within 3 weeks to facilitate carryover between sessions and move towards eventual independent management of their condition.    Time 3    Period Weeks    Status Achieved    Target Date 03/21/21               PT Long Term Goals - 05/22/21 1538       PT LONG TERM GOAL #1   Title Jeffrey [name] will improve 30'' STS (MCID 2) to >/= 10x to show improved LE strength and improved transfers.    Baseline 4x 8/16: 5x    Time 8    Period Weeks    Status New    Target Date 07/06/21      PT LONG TERM GOAL #2    Title Jeffrey [name] will improve gait speed to .9 m/s (.1 m/s MCID) to show functional improvement in ambulation    Baseline .32m/s 8/16: 1.1    Time 8    Period Weeks    Status Achieved    Target Date 07/06/21      PT LONG TERM GOAL #3   Title Jeffrey [name] will be able to stand for >30''' in tandem stance, to show a significant improvement in balance in order to reduce fall risk    Baseline unable 8/16: able in semi tandem with CGA, unable tandem    Time 8    Period Weeks    Status New    Target Date 07/06/21      PT LONG TERM GOAL #4   Title Jeffrey [name] will be able to transfer to and from toilet independently    Baseline unable    Time 8    Period Weeks    Status Achieved                   Plan - 05/22/21 1602     Clinical Impression Statement Pt reports no increase in baseline pain following therapy  HEP was reviewed, but left unchanged    Overall, Jeffrey [name] is progressing well with therapy.  Today we concentrated on core strengthening and lower extremity strengthening.  Pt is progressing well toward goals.  His foto score did decrease, but he made excellent progress toward other goals and met his gait speed goal.  We will continue to progress LE and core strength.  Pt will continue to benefit from skilled physical therapy to address remaining deficits and achieve listed goals.  Continue per POC.    Personal Factors and Comorbidities Comorbidity 3+    Comorbidities DM, hx of cardiac arrest, HTN, seizures, FALLS    Examination-Participation Restrictions Church;Community Activity    PT Treatment/Interventions ADLs/Self Care Home Management;Therapeutic exercise;Neuromuscular re-education;Manual techniques;Gait training;Therapeutic activities;Dry needling    PT Next Visit Plan Progress note next session, EVALUATE VITALS, LE strengthening and balance exercises, UE strengthening for transfers and lifting.    PT Home Exercise Plan [phone]    Consulted  and Agree with Plan of Care Patient             Patient will benefit from skilled therapeutic intervention in order to improve the following deficits and impairments:  Abnormal gait, Decreased activity tolerance, Decreased knowledge of use of DME, Decreased strength, Impaired UE functional use, Difficulty  walking, Decreased balance  Visit Diagnosis: Muscle weakness  Balance problem  Other abnormalities of gait and mobility     Problem List Patient Active Problem List   Diagnosis Date Noted   Grief counseling 01/04/2021   Incontinence 12/23/2020   Hospital discharge follow-up 11/24/2020   Syncope 11/17/2020   Hypomagnesemia 11/05/2020   Hematemesis with nausea    Alcohol abuse with intoxication ([place])    Duodenal ulcer hemorrhagic    GI bleed 10/31/2020   Hyperglycemia 10/31/2020   DKA (diabetic ketoacidosis) ([place]) 07/22/2020   Candidiasis 07/22/2020   Hyponatremia 06/28/2020   Eyebrow laceration, left, initial encounter    Hypokalemia    Hyperlipidemia associated with type 2 diabetes mellitus (HCC)    MGUS (monoclonal gammopathy of unknown significance) 05/31/2020   Seizure disorder ([place]) 03/16/2020   Type 2 diabetes mellitus with hyperglycemia, with long-term current use of insulin ([place]) 03/16/2020   Tremor 03/16/2020   Colon cancer screening 02/22/2020   Stage 3a chronic kidney disease ([place]) 01/29/2020   Hyperphosphatemia 01/29/2020   Major depressive disorder, recurrent episode ([place]) 01/13/2018   Weakness    Hypotension 07/27/2017   Seizure ([place]) 04/08/2017   Proteinuria 11/23/2015   Essential hypertension    Cirrhosis ([place]) 03/22/2015   Ataxia 03/20/2015   Elevated liver enzymes    Dysarthria 03/09/2015   AKI (acute kidney injury) ([place])    Acute blood loss anemia 11/17/2014   ETOH abuse    Hyperkalemia    Tachycardia    Melena 10/10/2014   Erosive esophagitis 10/10/2014   Somnolence 10/08/2014   Acute kidney injury ([place])    OSA on CPAP 12/28/2012    Cardiac arrest ([place]) 12/07/2012   Anoxic brain injury ([place]) 12/07/2012   ERECTILE DYSFUNCTION 04/14/2007   Type 2 diabetes mellitus with diabetic neuropathy, with long-term current use of insulin ([place]) 12/04/2006   HYPERCHOLESTEROLEMIA 12/04/2006    Shearon Balo PT, DPT 05/22/21 4:21 PM  [facility] [address] [place], [place], [address] Phone: [phone]   Fax:  [phone]  Name: DAKSH COATES MRN: [id number] Date of Birth: Feb 26, 1966

## 2021-05-24 ENCOUNTER — Ambulatory Visit: Payer: 59 | Admitting: Physical Therapy

## 2021-05-24 ENCOUNTER — Other Ambulatory Visit: Payer: Self-pay

## 2021-05-24 ENCOUNTER — Encounter: Payer: Self-pay | Admitting: Physical Therapy

## 2021-05-24 DIAGNOSIS — M6281 Muscle weakness (generalized): Secondary | ICD-10-CM | POA: Diagnosis not present

## 2021-05-24 DIAGNOSIS — R2689 Other abnormalities of gait and mobility: Secondary | ICD-10-CM

## 2021-05-24 NOTE — Therapy (Signed)
[place], [place], [address] Phone: [phone]   Fax:  [phone]  Physical Therapy Treatment  Patient Details  Name: Jeffrey [name] MRN: [id number] Date of Birth: Nov 30, 1965 Referring Provider (PT): Leeanne Rio, MD   Encounter Date: 05/24/2021   PT End of Session - 05/24/21 1529     Visit Number 12    Number of Visits 16    Date for PT Re-Evaluation 07/06/21    Authorization - Number of Visits 30    PT Start Time 3546    PT Stop Time 5681    PT Time Calculation (min) 44 min    Equipment Utilized During Treatment Gait belt    Activity Tolerance Patient tolerated treatment well    Behavior During Therapy [facility] for tasks assessed/performed             Past Medical History:  Diagnosis Date   Acute renal insufficiency 12/08/2012   AKI (acute kidney injury) ([place])    ALCOHOL ABUSE, HX OF 11/10/2007   Anoxic encephalopathy ([place])    Anxiety    Bleeding ulcer 2014   Blood transfusion 2014   "related to bleeding ulcer"   Cardiac arrest ([place]) 12/07/2012   Anoxic encephalopathy   GERD (gastroesophageal reflux disease)    High cholesterol    Hypertension    OSA on CPAP 12/07/2012   Seizure-like activity ([place])    Seizures ([place])    last seizure 01/05/2021   Type II diabetes mellitus ([place])    Venous insufficiency 12/13/2011    Past Surgical History:  Procedure Laterality Date   BIOPSY  11/01/2020   Procedure: BIOPSY;  Surgeon: Irene Shipper, MD;  Location: [facility];  Service: Endoscopy;;   CARDIOVASCULAR STRESS TEST  03/16/13   Very Poor Exercise Tolerance; NON DIAGNOSTIC TEST   CATARACT EXTRACTION W/ INTRAOCULAR LENS IMPLANT Left 03/07/2014   [place] @ [facility]   ESOPHAGOGASTRODUODENOSCOPY Left 11/26/2012   Procedure: ESOPHAGOGASTRODUODENOSCOPY (EGD);  Surgeon: Wonda Horner, MD;  Location: [facility] ENDOSCOPY;  Service: Endoscopy;  Laterality: Left;   ESOPHAGOGASTRODUODENOSCOPY N/A  10/10/2014   Procedure: ESOPHAGOGASTRODUODENOSCOPY (EGD);  Surgeon: Ladene Artist, MD;  Location: [facility] ENDOSCOPY;  Service: Endoscopy;  Laterality: N/A;   ESOPHAGOGASTRODUODENOSCOPY (EGD) WITH PROPOFOL N/A 11/01/2020   Procedure: ESOPHAGOGASTRODUODENOSCOPY (EGD) WITH PROPOFOL;  Surgeon: Irene Shipper, MD;  Location: [facility] ENDOSCOPY;  Service: Endoscopy;  Laterality: N/A;   KNEE ARTHROSCOPY Left ~ 1999   UPPER GASTROINTESTINAL ENDOSCOPY      There were no vitals filed for this visit.   Subjective Assessment - 05/24/21 1534     Subjective Pt reports fatigue after last session.  He is feeling more steady in gait and confident in transfers at home.  He has been HEP compliant.  He has 0/10 pain today.    Pertinent History DM, hx of cardiac arrest, HTN, seizures, FALL RISK            02 sat 98 HR 115 at rest   Therapeutic Exercise: - Nu-step 5 min L8 while taking subjective - Hurdle walking in // - 6 laps (4 hurdles) with 1 seated rest break - row with abdominal contraction - 27# - 3x10 - bkwd walkout - 27# - 4 times - cable machine (holding handles) - ball toss for core activation - seated - yellow ball - 90 degrees of flexion with rotation then...chest pass 2x8, OH reverse throw 1x8 - Quadruped UE lifts then LE lifts (3x2  ea) (not today) - LE initiated rolling - 2x ea with assist rolling from back to stomach     PT Short Term Goals - 05/17/21 1534       PT SHORT TERM GOAL #1   Jeffrey [name] will be >75% HEP compliant within 3 weeks to facilitate carryover between sessions and move towards eventual independent management of their condition.    Time 3    Period Weeks    Status Achieved    Target Date 03/21/21               PT Long Term Goals - 05/22/21 1538       PT LONG TERM GOAL #1   Jeffrey [name] Jeffrey [name] will improve 30'' STS (MCID 2) to >/= 10x to show improved LE strength and improved transfers.    Baseline 4x 8/16: 5x    Time 8    Period Weeks    Status  New    Target Date 07/06/21      PT LONG TERM GOAL #2   Jeffrey [name] Jeffrey [name] will improve gait speed to .9 m/s (.1 m/s MCID) to show functional improvement in ambulation    Baseline .25m/s 8/16: 1.1    Time 8    Period Weeks    Status Achieved    Target Date 07/06/21      PT LONG TERM GOAL #3   Jeffrey [name] Jeffrey [name] will be able to stand for >30''' in tandem stance, to show a significant improvement in balance in order to reduce fall risk    Baseline unable 8/16: able in semi tandem with CGA, unable tandem    Time 8    Period Weeks    Status New    Target Date 07/06/21      PT LONG TERM GOAL #4   Jeffrey [name] Jeffrey [name] will be able to transfer to and from toilet independently    Baseline unable    Time 8    Period Weeks    Status Achieved                   Plan - 05/24/21 1548     Clinical Impression Statement Pt reports no increase in baseline pain following therapy  HEP was reviewed, but left unchanged    Overall, Jeffrey [name] is progressing well with therapy.  Today we concentrated on core strengthening, lower extremity strengthening, and hip strengthening.  Pt show significant deficit in core strength which is impacting ambulation and other functional activities.  He wil benefit from integration of core strengthening and core motor control work as able.  Pt will continue to benefit from skilled physical therapy to address remaining deficits and achieve listed goals.  Continue per POC.    Personal Factors and Comorbidities Comorbidity 3+    Comorbidities DM, hx of cardiac arrest, HTN, seizures, FALLS    Examination-Participation Restrictions Church;Community Activity    PT Treatment/Interventions ADLs/Self Care Home Management;Therapeutic exercise;Neuromuscular re-education;Manual techniques;Gait training;Therapeutic activities;Dry needling    PT Next Visit Plan Progress note next session, EVALUATE VITALS, LE strengthening and balance exercises, UE  strengthening for transfers and lifting.    PT Home Exercise Plan [phone]    Consulted and Agree with Plan of Care Patient             Patient will benefit from skilled therapeutic intervention in order to improve the following deficits and impairments:  Abnormal gait, Decreased activity tolerance, Decreased knowledge of  use of DME, Decreased strength, Impaired UE functional use, Difficulty walking, Decreased balance  Visit Diagnosis: Muscle weakness  Balance problem  Other abnormalities of gait and mobility     Problem List Patient Active Problem List   Diagnosis Date Noted   Grief counseling 01/04/2021   Incontinence 12/23/2020   Hospital discharge follow-up 11/24/2020   Syncope 11/17/2020   Hypomagnesemia 11/05/2020   Hematemesis with nausea    Alcohol abuse with intoxication ([place])    Duodenal ulcer hemorrhagic    GI bleed 10/31/2020   Hyperglycemia 10/31/2020   DKA (diabetic ketoacidosis) ([place]) 07/22/2020   Candidiasis 07/22/2020   Hyponatremia 06/28/2020   Eyebrow laceration, left, initial encounter    Hypokalemia    Hyperlipidemia associated with type 2 diabetes mellitus (HCC)    MGUS (monoclonal gammopathy of unknown significance) 05/31/2020   Seizure disorder ([place]) 03/16/2020   Type 2 diabetes mellitus with hyperglycemia, with long-term current use of insulin ([place]) 03/16/2020   Tremor 03/16/2020   Colon cancer screening 02/22/2020   Stage 3a chronic kidney disease ([place]) 01/29/2020   Hyperphosphatemia 01/29/2020   Major depressive disorder, recurrent episode ([place]) 01/13/2018   Weakness    Hypotension 07/27/2017   Seizure ([place]) 04/08/2017   Proteinuria 11/23/2015   Essential hypertension    Cirrhosis ([place]) 03/22/2015   Ataxia 03/20/2015   Elevated liver enzymes    Dysarthria 03/09/2015   AKI (acute kidney injury) ([place])    Acute blood loss anemia 11/17/2014   ETOH abuse    Hyperkalemia    Tachycardia    Melena 10/10/2014   Erosive esophagitis  10/10/2014   Somnolence 10/08/2014   Acute kidney injury ([place])    OSA on CPAP 12/28/2012   Cardiac arrest ([place]) 12/07/2012   Anoxic brain injury ([place]) 12/07/2012   ERECTILE DYSFUNCTION 04/14/2007   Type 2 diabetes mellitus with diabetic neuropathy, with long-term current use of insulin ([place]) 12/04/2006   HYPERCHOLESTEROLEMIA 12/04/2006    Shearon Balo PT, DPT 05/24/21 4:15 PM  [facility] [address] [place], [place], [address] Phone: [phone]   Fax:  [phone]  Name: DECODA VAN MRN: [id number] Date of Birth: Jan 19, 1966

## 2021-05-28 ENCOUNTER — Encounter: Payer: Self-pay | Admitting: Neurology

## 2021-05-28 ENCOUNTER — Ambulatory Visit: Payer: 59

## 2021-05-29 ENCOUNTER — Other Ambulatory Visit: Payer: Self-pay

## 2021-05-29 ENCOUNTER — Ambulatory Visit: Payer: 59 | Admitting: Physical Therapy

## 2021-05-29 ENCOUNTER — Encounter: Payer: Self-pay | Admitting: Physical Therapy

## 2021-05-29 DIAGNOSIS — M6281 Muscle weakness (generalized): Secondary | ICD-10-CM | POA: Diagnosis not present

## 2021-05-29 DIAGNOSIS — R2689 Other abnormalities of gait and mobility: Secondary | ICD-10-CM

## 2021-05-29 NOTE — Therapy (Signed)
[place], [place], [address] Phone: [phone]   Fax:  [phone]  Physical Therapy Treatment  Patient Details  Name: Jeffrey [name] MRN: [id number] Date of Birth: [birth year] Referring Provider (PT): Jeffrey Rio, MD   Encounter Date: 05/29/2021   PT End of Session - 05/29/21 1525     Visit Number 13    Number of Visits 16    Date for PT Re-Evaluation 07/06/21    Authorization - Number of Visits 30    PT Start Time 0330    PT Stop Time 0412    PT Time Calculation (min) 42 min    Equipment Utilized During Treatment Gait belt    Activity Tolerance Patient tolerated treatment well    Behavior During Therapy [facility] for tasks assessed/performed             Past Medical History:  Diagnosis Date   Acute renal insufficiency 12/08/2012   AKI (acute kidney injury) ([place])    ALCOHOL ABUSE, HX OF 11/10/2007   Anoxic encephalopathy ([place])    Anxiety    Bleeding ulcer 2014   Blood transfusion 2014   "related to bleeding ulcer"   Cardiac arrest ([place]) 12/07/2012   Anoxic encephalopathy   GERD (gastroesophageal reflux disease)    High cholesterol    Hypertension    OSA on CPAP 12/07/2012   Seizure-like activity ([place])    Seizures ([place])    last seizure 01/05/2021   Type II diabetes mellitus ([place])    Venous insufficiency 12/13/2011    Past Surgical History:  Procedure Laterality Date   BIOPSY  11/01/2020   Procedure: BIOPSY;  Surgeon: Jeffrey Shipper, MD;  Location: [place];  Service: Endoscopy;;   CARDIOVASCULAR STRESS TEST  03/16/13   Very Poor Exercise Tolerance; NON DIAGNOSTIC TEST   CATARACT EXTRACTION W/ INTRAOCULAR LENS IMPLANT Left 03/07/2014   [place] @ [facility]   ESOPHAGOGASTRODUODENOSCOPY Left 11/26/2012   Procedure: ESOPHAGOGASTRODUODENOSCOPY (EGD);  Surgeon: Jeffrey Horner, MD;  Location[facility]COPY;  Service: Endoscopy;  Laterality: Left;   ESOPHAGOGASTRODUODENOSCOPY N/A  10/10/2014   Procedure: ESOPHAGOGASTRODUODENOSCOPY (EGD);  Surgeon: Jeffrey Artist, MD;  Location: [facility] ENDOSCOPY;  Service: Endoscopy;  Laterality: N/A;   ESOPHAGOGASTRODUODENOSCOPY (EGD) WITH PROPOFOL N/A 11/01/2020   Procedure: ESOPHAGOGASTRODUODENOSCOPY (EGD) WITH PROPOFOL;  Surgeon: Jeffrey Shipper, MD;  Location[facility] ENDOSCOPY;  Service: Endoscopy;  Laterality: N/A;   KNEE ARTHROSCOPY Left ~ 1999   UPPER GASTROINTESTINAL ENDOSCOPY      There were no vitals filed for this visit.   Subjective Assessment - 05/29/21 1526     Subjective He has been HEP compliant.  He feels he is improving and getting stronger.  He has 0/10 pain today.    Pertinent History DM, hx of cardiac arrest, HTN, seizures, FALL RISK             02 sat 98 HR 118 at rest   Therapeutic Exercise to strengthen LE and core in order to improve upright functional mobility and reduce fall risk.  Pt cued for form and pacing throughout: - Nu-step 5 min L8 while taking subjective  - Step up with contralateral UE support - 4'' step - 10x ea - Lateral step up with contralateral UE support - 4'' step - 10x ea - bkwd walkout - 27# - 4 times - cable machine (holding handles) (not today) - ball toss for core activation - seated - yellow ball - 90 degrees  of flexion with rotation then...chest pass 1x8, OH reverse throw 1x8 + 1 round with no rotation - 3/4 tandem stance - 30'' bouts 2x (CGA) - Quadruped UE lifts then LE lifts (3x2 ea) (not today) - LE initiated rolling - 2x ea with assist rolling from back to stomach (not today) - Hurdle walking in // - 6 laps (4 hurdles) with 1 seated rest break (not today)   PT Short Term Goals - 05/17/21 1534       PT SHORT TERM GOAL #1   [place] will be >75% HEP compliant within 3 weeks to facilitate carryover between sessions and move towards eventual independent management of their condition.    Time 3    Period Weeks    Status Achieved    Target Date 03/21/21                PT Long Term Goals - 05/22/21 1538       PT LONG TERM GOAL #1   Title Jeffrey [name] will improve 30'' STS (MCID 2) to >/= 10x to show improved LE strength and improved transfers.    Baseline 4x 8/16: 5x    Time 8    Period Weeks    Status New    Target Date 07/06/21      PT LONG TERM GOAL #2   Title Jeffrey [name] will improve gait speed to .9 m/s (.1 m/s MCID) to show functional improvement in ambulation    Baseline .17m/s 8/16: 1.1    Time 8    Period Weeks    Status Achieved    Target Date 07/06/21      PT LONG TERM GOAL #3   Title Jeffrey [name] will be able to stand for >30''' in tandem stance, to show a significant improvement in balance in order to reduce fall risk    Baseline unable 8/16: able in semi tandem with CGA, unable tandem    Time 8    Period Weeks    Status New    Target Date 07/06/21      PT LONG TERM GOAL #4   Title Jeffrey [name] will be able to transfer to and from toilet independently    Baseline unable    Time 8    Period Weeks    Status Achieved                   Plan - 05/29/21 1534     Clinical Impression Statement Pt is progressig well.  At this point I think core strength is a significant limitation for him and needs to be addressed.  Progress with balance and gait will be difficult without core control and strength.  Continue per POC.    Personal Factors and Comorbidities Comorbidity 3+    Comorbidities DM, hx of cardiac arrest, HTN, seizures, FALLS    Examination-Participation Restrictions Church;Community Activity    PT Treatment/Interventions ADLs/Self Care Home Management;Therapeutic exercise;Neuromuscular re-education;Manual techniques;Gait training;Therapeutic activities;Dry needling    PT Next Visit Plan Progress note next session, EVALUATE VITALS, LE strengthening and balance exercises, UE strengthening for transfers and lifting.    PT Home Exercise Plan [phone]    Consulted and Agree with Plan of Care  Patient             Patient will benefit from skilled therapeutic intervention in order to improve the following deficits and impairments:  Abnormal gait, Decreased activity tolerance, Decreased knowledge of use of DME,  Decreased strength, Impaired UE functional use, Difficulty walking, Decreased balance  Visit Diagnosis: Muscle weakness  Balance problem  Other abnormalities of gait and mobility     Problem List Patient Active Problem List   Diagnosis Date Noted   Grief counseling 01/04/2021   Incontinence 12/23/2020   Hospital discharge follow-up 11/24/2020   Syncope 11/17/2020   Hypomagnesemia 11/05/2020   Hematemesis with nausea    Alcohol abuse with intoxication ([place])    Duodenal ulcer hemorrhagic    GI bleed 10/31/2020   Hyperglycemia 10/31/2020   DKA (diabetic ketoacidosis) ([place]) 07/22/2020   Candidiasis 07/22/2020   Hyponatremia 06/28/2020   Eyebrow laceration, left, initial encounter    Hypokalemia    Hyperlipidemia associated with type 2 diabetes mellitus (HCC)    MGUS (monoclonal gammopathy of unknown significance) 05/31/2020   Seizure disorder ([place]) 03/16/2020   Type 2 diabetes mellitus with hyperglycemia, with long-term current use of insulin ([place]) 03/16/2020   Tremor 03/16/2020   Colon cancer screening 02/22/2020   Stage 3a chronic kidney disease ([place]) 01/29/2020   Hyperphosphatemia 01/29/2020   Major depressive disorder, recurrent episode ([place]) 01/13/2018   Weakness    Hypotension 07/27/2017   Seizure ([place]) 04/08/2017   Proteinuria 11/23/2015   Essential hypertension    Cirrhosis ([place]) 03/22/2015   Ataxia 03/20/2015   Elevated liver enzymes    Dysarthria 03/09/2015   AKI (acute kidney injury) ([place])    Acute blood loss anemia 11/17/2014   ETOH abuse    Hyperkalemia    Tachycardia    Melena 10/10/2014   Erosive esophagitis 10/10/2014   Somnolence 10/08/2014   Acute kidney injury ([place])    OSA on CPAP 12/28/2012   Cardiac arrest ([place])  12/07/2012   Anoxic brain injury ([place]) 12/07/2012   ERECTILE DYSFUNCTION 04/14/2007   Type 2 diabetes mellitus with diabetic neuropathy, with long-term current use of insulin ([place]) 12/04/2006   HYPERCHOLESTEROLEMIA 12/04/2006    Shearon Balo PT, DPT 05/29/21 4:15 PM  [facility] [address] [place], [place], [address] Phone: [phone]   Fax:  [phone]  Name: Jeffrey [name] MRN: [id number] Date of Birth: 06-Nov-1965

## 2021-05-29 NOTE — Patient Instructions (Signed)
Access Code: [email or web address] URL: [email or web address] Date: 05/29/2021 Prepared by: Shearon Balo  Exercises Seated Hip Abduction with Resistance - 1 x daily - 7 x weekly - 3 sets - 10 reps Sit to Stand with Counter Support - 1 x daily - 7 x weekly - 3 sets - 4 reps Seated March with Resistance - 1 x daily - 7 x weekly - 3 sets - 10 reps Standing Romberg to 1/2 Tandem Stance (with counter) - 1 x daily - 7 x weekly - 1 sets - 3 reps - 30 hold

## 2021-05-31 ENCOUNTER — Encounter: Payer: Self-pay | Admitting: Physical Therapy

## 2021-05-31 ENCOUNTER — Ambulatory Visit: Payer: 59 | Admitting: Physical Therapy

## 2021-05-31 ENCOUNTER — Other Ambulatory Visit: Payer: Self-pay

## 2021-05-31 DIAGNOSIS — R2689 Other abnormalities of gait and mobility: Secondary | ICD-10-CM

## 2021-05-31 DIAGNOSIS — M6281 Muscle weakness (generalized): Secondary | ICD-10-CM | POA: Diagnosis not present

## 2021-05-31 NOTE — Therapy (Signed)
[place], [place], [address] Phone: [phone]   Fax:  [phone]  Physical Therapy Treatment  Patient Details  Name: Jeffrey [name] MRN: [id number] Date of Birth: 03/07/1966 Referring Provider (PT): Leeanne Rio, MD   Encounter Date: 05/31/2021   PT End of Session - 05/31/21 1528     Visit Number 14    Number of Visits 16    Date for PT Re-Evaluation 07/06/21    Authorization - Number of Visits 30    PT Start Time 8101    PT Stop Time 7510    PT Time Calculation (min) 44 min    Equipment Utilized During Treatment Gait belt    Activity Tolerance Patient tolerated treatment well    Behavior During Therapy [facility] for tasks assessed/performed             Past Medical History:  Diagnosis Date   Acute renal insufficiency 12/08/2012   AKI (acute kidney injury) ([place])    ALCOHOL ABUSE, HX OF 11/10/2007   Anoxic encephalopathy ([place])    Anxiety    Bleeding ulcer 2014   Blood transfusion 2014   "related to bleeding ulcer"   Cardiac arrest ([place]) 12/07/2012   Anoxic encephalopathy   GERD (gastroesophageal reflux disease)    High cholesterol    Hypertension    OSA on CPAP 12/07/2012   Seizure-like activity ([place])    Seizures ([place])    last seizure 01/05/2021   Type II diabetes mellitus ([place])    Venous insufficiency 12/13/2011    Past Surgical History:  Procedure Laterality Date   BIOPSY  11/01/2020   Procedure: BIOPSY;  Surgeon: Irene Shipper, MD;  Location: [place];  Service: Endoscopy;;   CARDIOVASCULAR STRESS TEST  03/16/13   Very Poor Exercise Tolerance; NON DIAGNOSTIC TEST   CATARACT EXTRACTION W/ INTRAOCULAR LENS IMPLANT Left 03/07/2014   [place] @ [name]   ESOPHAGOGASTRODUODENOSCOPY Left 11/26/2012   Procedure: ESOPHAGOGASTRODUODENOSCOPY (EGD);  Surgeon: Wonda Horner, MD;  Location[facility] ENDOSCOPY;  Service: Endoscopy;  Laterality: Left;   ESOPHAGOGASTRODUODENOSCOPY N/A  10/10/2014   Procedure: ESOPHAGOGASTRODUODENOSCOPY (EGD);  Surgeon: Ladene Artist, MD;  Location: [facility] ENDOSCOPY;  Service: Endoscopy;  Laterality: N/A;   ESOPHAGOGASTRODUODENOSCOPY (EGD) WITH PROPOFOL N/A 11/01/2020   Procedure: ESOPHAGOGASTRODUODENOSCOPY (EGD) WITH PROPOFOL;  Surgeon: Irene Shipper, MD;  Location: [facility] ENDOSCOPY;  Service: Endoscopy;  Laterality: N/A;   KNEE ARTHROSCOPY Left ~ 1999   UPPER GASTROINTESTINAL ENDOSCOPY      There were no vitals filed for this visit.   Subjective Assessment - 05/31/21 1530     Subjective Pt reports he has been doing his exercises 3x/day and feels he is making progress.  He has 0/10 pain today.    Pertinent History DM, hx of cardiac arrest, HTN, seizures, FALL RISK              02 sat 98 HR 118 at rest (monitored throughout)   Therapeutic Exercise to strengthen LE and core in order to improve upright functional mobility and reduce fall risk.  Pt cued for form and pacing throughout: - Nu-step 5 min L9 while taking subjective - Step up with contralateral UE support - 4'' step - 10x ea - Lateral step up with contralateral UE support - 4'' step - 10x ea - bkwd walkout - 27# - 4 times - cable machine (holding handles) (not today) - ball toss for core activation - seated - yellow ball -  90 degrees of flexion with rotation then...chest pass 1x8, OH reverse throw 1x8 + 1 round with no rotation and blue ball - seated march with elbow taps - 10x  - Quadruped UE lifts then LE lifts (3x2 ea) (not today) - LE initiated rolling - 2x ea with assist rolling from back to stomach (not today) - Hurdle walking in // - 6 laps (4 hurdles) with 1 seated rest break (not today)  [place] Re-ed to improve balance and reduce fall risk:  - tandem stance - 30'' bouts 2x (CGA) - rocker board DF/PF     PT Short Term Goals - 05/17/21 1534       PT SHORT TERM GOAL #1   [place] will be >75% HEP compliant within 3 weeks to facilitate carryover between  sessions and move towards eventual independent management of their condition.    Time 3    Period Weeks    Status Achieved    Target Date 03/21/21               PT Long Term Goals - 05/22/21 1538       PT LONG TERM GOAL #1   Title [name] will improve 30'' STS (MCID 2) to >/= 10x to show improved LE strength and improved transfers.    Baseline 4x 8/16: 5x    Time 8    Period Weeks    Status New    Target Date 07/06/21      PT LONG TERM GOAL #2   Title PHILIPE LASWELL will improve gait speed to .9 m/s (.1 m/s MCID) to show functional improvement in ambulation    Baseline .40m/s 8/16: 1.1    Time 8    Period Weeks    Status Achieved    Target Date 07/06/21      PT LONG TERM GOAL #3   Title MARIOS GAISER will be able to stand for >30''' in tandem stance, to show a significant improvement in balance in order to reduce fall risk    Baseline unable 8/16: able in semi tandem with CGA, unable tandem    Time 8    Period Weeks    Status New    Target Date 07/06/21      PT LONG TERM GOAL #4   Title JERELD PRESTI will be able to transfer to and from toilet independently    Baseline unable    Time 8    Period Weeks    Status Achieved                   Plan - 05/31/21 [place] is progressing slowly toward goals.  We are working on integrating more functional movement such as step ups and core control.  Pt is tolerating this well.  He is fatigued, is consistently improving activity tolerance.  Continue per POC.    Personal Factors and Comorbidities Comorbidity 3+    Comorbidities DM, hx of cardiac arrest, HTN, seizures, FALLS    Examination-Participation Restrictions Church;Community Activity    PT Treatment/Interventions ADLs/Self Care Home Management;Therapeutic exercise;Neuromuscular re-education;Manual techniques;Gait training;Therapeutic activities;Dry needling    PT Next Visit Plan Progress note  next session, EVALUATE VITALS, LE strengthening and balance exercises, UE strengthening for transfers and lifting.    PT Home Exercise Plan [phone]    Consulted and Agree with Plan of Care Patient  Patient will benefit from skilled therapeutic intervention in order to improve the following deficits and impairments:  Abnormal gait, Decreased activity tolerance, Decreased knowledge of use of DME, Decreased strength, Impaired UE functional use, Difficulty walking, Decreased balance  Visit Diagnosis: Muscle weakness  Balance problem  Other abnormalities of gait and mobility     Problem List Patient Active Problem List   Diagnosis Date Noted   Grief counseling 01/04/2021   Incontinence 12/23/2020   Hospital discharge follow-up 11/24/2020   Syncope 11/17/2020   Hypomagnesemia 11/05/2020   Hematemesis with nausea    Alcohol abuse with intoxication ([place])    Duodenal ulcer hemorrhagic    GI bleed 10/31/2020   Hyperglycemia 10/31/2020   DKA (diabetic ketoacidosis) ([place]) 07/22/2020   Candidiasis 07/22/2020   Hyponatremia 06/28/2020   Eyebrow laceration, left, initial encounter    Hypokalemia    Hyperlipidemia associated with type 2 diabetes mellitus (HCC)    MGUS (monoclonal gammopathy of unknown significance) 05/31/2020   Seizure disorder ([place]) 03/16/2020   Type 2 diabetes mellitus with hyperglycemia, with long-term current use of insulin ([place]) 03/16/2020   Tremor 03/16/2020   Colon cancer screening 02/22/2020   Stage 3a chronic kidney disease ([place]) 01/29/2020   Hyperphosphatemia 01/29/2020   Major depressive disorder, recurrent episode ([place]) 01/13/2018   Weakness    Hypotension 07/27/2017   Seizure ([place]) 04/08/2017   Proteinuria 11/23/2015   Essential hypertension    Cirrhosis ([place]) 03/22/2015   Ataxia 03/20/2015   Elevated liver enzymes    Dysarthria 03/09/2015   AKI (acute kidney injury) ([place])    Acute blood loss anemia 11/17/2014   ETOH abuse     Hyperkalemia    Tachycardia    Melena 10/10/2014   Erosive esophagitis 10/10/2014   Somnolence 10/08/2014   Acute kidney injury ([place])    OSA on CPAP 12/28/2012   Cardiac arrest ([place]) 12/07/2012   Anoxic brain injury ([place]) 12/07/2012   ERECTILE DYSFUNCTION 04/14/2007   Type 2 diabetes mellitus with diabetic neuropathy, with long-term current use of insulin ([place]) 12/04/2006   HYPERCHOLESTEROLEMIA 12/04/2006    Shearon Balo PT, DPT 05/31/21 4:14 PM  [facility] [address] [place], [place], [address] Phone: [phone]   Fax:  [phone]  Name: DARRIL PATRIARCA MRN: [id number] Date of Birth: 11-Apr-1966

## 2021-06-01 ENCOUNTER — Other Ambulatory Visit: Payer: Self-pay | Admitting: Family Medicine

## 2021-06-05 ENCOUNTER — Other Ambulatory Visit: Payer: Self-pay

## 2021-06-05 ENCOUNTER — Ambulatory Visit: Payer: 59 | Admitting: Physical Therapy

## 2021-06-05 ENCOUNTER — Encounter: Payer: Self-pay | Admitting: Physical Therapy

## 2021-06-05 DIAGNOSIS — M6281 Muscle weakness (generalized): Secondary | ICD-10-CM

## 2021-06-05 DIAGNOSIS — R2689 Other abnormalities of gait and mobility: Secondary | ICD-10-CM

## 2021-06-05 NOTE — Therapy (Signed)
[place], [place], [address] Phone: [phone]   Fax:  [phone]  Physical Therapy Treatment  Patient Details  Name: Jeffrey [name] MRN: [id number] Date of Birth: 01-15-1966 Referring Provider (PT): Leeanne Rio, MD   Encounter Date: 06/05/2021   PT End of Session - 06/05/21 1525     Visit Number 15    Number of Visits 16    Date for PT Re-Evaluation 07/06/21    Authorization - Number of Visits 30    PT Start Time 0354    PT Stop Time 6568    PT Time Calculation (min) 45 min    Equipment Utilized During Treatment Gait belt    Activity Tolerance Patient tolerated treatment well    Behavior During Therapy [facility] for tasks assessed/performed             Past Medical History:  Diagnosis Date   Acute renal insufficiency 12/08/2012   AKI (acute kidney injury) ([place])    ALCOHOL ABUSE, HX OF 11/10/2007   Anoxic encephalopathy ([place])    Anxiety    Bleeding ulcer 2014   Blood transfusion 2014   "related to bleeding ulcer"   Cardiac arrest ([place]) 12/07/2012   Anoxic encephalopathy   GERD (gastroesophageal reflux disease)    High cholesterol    Hypertension    OSA on CPAP 12/07/2012   Seizure-like activity ([place])    Seizures ([place])    last seizure 01/05/2021   Type II diabetes mellitus ([place])    Venous insufficiency 12/13/2011    Past Surgical History:  Procedure Laterality Date   BIOPSY  11/01/2020   Procedure: BIOPSY;  Surgeon: Irene Shipper, MD;  Location: [place];  Service: Endoscopy;;   CARDIOVASCULAR STRESS TEST  03/16/13   Very Poor Exercise Tolerance; NON DIAGNOSTIC TEST   CATARACT EXTRACTION W/ INTRAOCULAR LENS IMPLANT Left 03/07/2014   [facility] @ [facility]   ESOPHAGOGASTRODUODENOSCOPY Left 11/26/2012   Procedure: ESOPHAGOGASTRODUODENOSCOPY (EGD);  Surgeon: Wonda Horner, MD;  Location[facility] ENDOSCOPY;  Service: Endoscopy;  Laterality: Left;   ESOPHAGOGASTRODUODENOSCOPY N/A  10/10/2014   Procedure: ESOPHAGOGASTRODUODENOSCOPY (EGD);  Surgeon: Ladene Artist, MD;  Location: [facility] ENDOSCOPY;  Service: Endoscopy;  Laterality: N/A;   ESOPHAGOGASTRODUODENOSCOPY (EGD) WITH PROPOFOL N/A 11/01/2020   Procedure: ESOPHAGOGASTRODUODENOSCOPY (EGD) WITH PROPOFOL;  Surgeon: Irene Shipper, MD;  Location: [facility] ENDOSCOPY;  Service: Endoscopy;  Laterality: N/A;   KNEE ARTHROSCOPY Left ~ 1999   UPPER GASTROINTESTINAL ENDOSCOPY      There were no vitals filed for this visit.   Subjective Assessment - 06/05/21 1535     Subjective Pt reports that he was able to replace a part on the bottom of his fridge this weekend and then stand up using a chair for UE support.  He has 0/10 pain today.    Pertinent History DM, hx of cardiac arrest, HTN, seizures, FALL RISK    Currently in Pain? No/denies            Pulse 114 at rest  Therapeutic Exercise to strengthen LE and core in order to improve upright functional mobility and reduce fall risk.  Pt cued for form and pacing throughout: - Nu-step 5 min L9 while taking subjective - Step up with contralateral UE support - 4'' step - 12x ea - Lateral step up with contralateral UE support - 4'' step - 12x ea - bkwd walkout - 30# - 4 times - cable machine (holding handles) -  ball toss for core activation - seated on dyna disk - seated - yellow ball - 90 degrees of flexion with rotation then...chest pass 1x8,  - tandem stance - 30'' bouts 2x (CGA)   THE FOLLOWING WERE NOT PERFORMED:  - seated march with elbow taps - 10x - Quadruped UE lifts then LE lifts (3x2 ea) (not today) - LE initiated rolling - 2x ea with assist rolling from back to stomach (not today) - Hurdle walking in // - 6 laps (4 hurdles) with 1 seated rest break (not today)    PT Short Term Goals - 05/17/21 1534       PT SHORT TERM GOAL #1   [place] will be >75% HEP compliant within 3 weeks to facilitate carryover between sessions and move towards eventual  independent management of their condition.    Time 3    Period Weeks    Status Achieved    Target Date 03/21/21                 PT Long Term Goals - 05/22/21 1538       PT LONG TERM GOAL #1   Title Jeffrey [name] will improve 30'' STS (MCID 2) to >/= 10x to show improved LE strength and improved transfers.    Baseline 4x 8/16: 5x    Time 8    Period Weeks    Status New    Target Date 07/06/21      PT LONG TERM GOAL #2   Title Jeffrey [name] will improve gait speed to .9 m/s (.1 m/s MCID) to show functional improvement in ambulation    Baseline .68m/s 8/16: 1.1    Time 8    Period Weeks    Status Achieved    Target Date 07/06/21      PT LONG TERM GOAL #3   Title Jeffrey [name] will be able to stand for >30''' in tandem stance, to show a significant improvement in balance in order to reduce fall risk    Baseline unable 8/16: able in semi tandem with CGA, unable tandem    Time 8    Period Weeks    Status New    Target Date 07/06/21      PT LONG TERM GOAL #4   Title Jeffrey [name] will be able to transfer to and from toilet independently    Baseline unable    Time 8    Period Weeks    Status Achieved                   Plan - 06/05/21 1553     Clinical Impression Statement Pt continues to show increased activity tolerance and strength.  He has been compliant with HEP.  He is progressing functionally at home with ability to transfer from floor to standing over the weekend with UE support.  Generally fatigued today.  We discussed working out 4x/week and having rest days every other day.  Continue per POC.    Personal Factors and Comorbidities Comorbidity 3+    Comorbidities DM, hx of cardiac arrest, HTN, seizures, FALLS    Examination-Participation Restrictions Church;Community Activity    PT Treatment/Interventions ADLs/Self Care Home Management;Therapeutic exercise;Neuromuscular re-education;Manual techniques;Gait training;Therapeutic  activities;Dry needling    PT Next Visit Plan Progress note next session, EVALUATE VITALS, LE strengthening and balance exercises, UE strengthening for transfers and lifting.    PT Home Exercise Plan [phone]    Consulted and Agree with  Plan of Care Patient             Patient will benefit from skilled therapeutic intervention in order to improve the following deficits and impairments:  Abnormal gait, Decreased activity tolerance, Decreased knowledge of use of DME, Decreased strength, Impaired UE functional use, Difficulty walking, Decreased balance  Visit Diagnosis: Muscle weakness  Balance problem  Other abnormalities of gait and mobility     Problem List Patient Active Problem List   Diagnosis Date Noted   Grief counseling 01/04/2021   Incontinence 12/23/2020   Hospital discharge follow-up 11/24/2020   Syncope 11/17/2020   Hypomagnesemia 11/05/2020   Hematemesis with nausea    Alcohol abuse with intoxication ([place])    Duodenal ulcer hemorrhagic    GI bleed 10/31/2020   Hyperglycemia 10/31/2020   DKA (diabetic ketoacidosis) ([place]) 07/22/2020   Candidiasis 07/22/2020   Hyponatremia 06/28/2020   Eyebrow laceration, left, initial encounter    Hypokalemia    Hyperlipidemia associated with type 2 diabetes mellitus (HCC)    MGUS (monoclonal gammopathy of unknown significance) 05/31/2020   Seizure disorder ([place]) 03/16/2020   Type 2 diabetes mellitus with hyperglycemia, with long-term current use of insulin ([place]) 03/16/2020   Tremor 03/16/2020   Colon cancer screening 02/22/2020   Stage 3a chronic kidney disease ([place]) 01/29/2020   Hyperphosphatemia 01/29/2020   Major depressive disorder, recurrent episode ([place]) 01/13/2018   Weakness    Hypotension 07/27/2017   Seizure ([place]) 04/08/2017   Proteinuria 11/23/2015   Essential hypertension    Cirrhosis ([place]) 03/22/2015   Ataxia 03/20/2015   Elevated liver enzymes    Dysarthria 03/09/2015   AKI (acute kidney injury) ([place])     Acute blood loss anemia 11/17/2014   ETOH abuse    Hyperkalemia    Tachycardia    Melena 10/10/2014   Erosive esophagitis 10/10/2014   Somnolence 10/08/2014   Acute kidney injury ([place])    OSA on CPAP 12/28/2012   Cardiac arrest ([place]) 12/07/2012   Anoxic brain injury ([place]) 12/07/2012   ERECTILE DYSFUNCTION 04/14/2007   Type 2 diabetes mellitus with diabetic neuropathy, with long-term current use of insulin ([place]) 12/04/2006   HYPERCHOLESTEROLEMIA 12/04/2006    Shearon Balo PT, DPT 06/05/21 5:21 PM  [facility] [address] [place], [place], [address] Phone: [phone]   Fax:  [phone]  Name: Jeffrey [name] MRN: [id number] Date of Birth: 04/06/1966

## 2021-06-06 ENCOUNTER — Other Ambulatory Visit: Payer: Self-pay | Admitting: Family Medicine

## 2021-06-08 ENCOUNTER — Other Ambulatory Visit: Payer: Self-pay

## 2021-06-08 ENCOUNTER — Ambulatory Visit (INDEPENDENT_AMBULATORY_CARE_PROVIDER_SITE_OTHER): Payer: 59 | Admitting: Family Medicine

## 2021-06-08 ENCOUNTER — Encounter: Payer: Self-pay | Admitting: Family Medicine

## 2021-06-08 VITALS — BP 124/95 | HR 110 | Ht 70.0 in | Wt 241.8 lb

## 2021-06-08 DIAGNOSIS — E1140 Type 2 diabetes mellitus with diabetic neuropathy, unspecified: Secondary | ICD-10-CM

## 2021-06-08 DIAGNOSIS — Z794 Long term (current) use of insulin: Secondary | ICD-10-CM

## 2021-06-08 DIAGNOSIS — Z23 Encounter for immunization: Secondary | ICD-10-CM | POA: Diagnosis not present

## 2021-06-08 LAB — POCT GLYCOSYLATED HEMOGLOBIN (HGB A1C): HbA1c, POC (controlled diabetic range): 5.7 % (ref 0.0–7.0)

## 2021-06-08 NOTE — Progress Notes (Signed)
    SUBJECTIVE:   CHIEF COMPLAINT / HPI:   Diabetes  Patient reports he is concerned about his glucoses because he has started checking his blood glucoses 3 months he noticed that they are in the 200s to 300s.  On the morning they are in the low 100s or high 90s.  Denies any low blood sugars with the lowest being 78.  Reports he did not feel hypoglycemic at that time.  Currently on long-acting Lantus.  Most recent hemoglobin A1c was 5.7.    OBJECTIVE:   BP (!) 124/95   Pulse (!) 110   Ht 5\' 10"  (1.778 m)   Wt 241 lb 12.8 oz (109.7 kg)   SpO2 100%   BMI 34.69 kg/m   General: Chronically ill-appearing 55 year old male walking with a cane Cardiac: Regular rate and rhythm, appreciated Respiratory: Breathing, lungs clear to auscultation bilaterally  abdomen: Soft, nontender, positive bowel sounds  ASSESSMENT/PLAN:   Type 2 diabetes mellitus with diabetic neuropathy, with long-term current use of insulin ([place]) Patient feels like his blood sugars have been elevated recently but he has been checking them after he eats large meals.  Lowest blood glucose was 78.  Most recent hemoglobin A1c was 5.7 and it was rechecked today and was stable at 5.7.  Patient currently on 12 units of Lantus daily.  Discussed decreasing the dose would like to remain where he is at this time.  We will continue to monitor and if he has low blood sugars we will decrease the dose.  Discussed signs and symptoms of hypoglycemia and patient knows to strict ED and return precautions.  Follow-up in 3 months.    Optometry in October   [name], MD [place]

## 2021-06-08 NOTE — Patient Instructions (Signed)
It was great seeing you today!  I am glad you are doing well.  Your blood sugars have actually been really good and your hemoglobin A1c is stable at 5.7.  I want to continue on your current diabetes medication regimen.  Regarding your lack of feeling in your feet I want you to follow-up with your neurologist to see if they have any recommendations or further testing that they want to get.  Please keep an eye on the wound on your right toe because I do not want to get infected.  If you have worsening pain or noticed that it looks worse please be evaluated in our clinic or the hospital.  Be sure to call [facility] regarding your colonoscopy scheduling and make it to your diabetic retinopathy ophthalmology appointment.  If you have any questions or concerns call the clinic.  I hope you have a wonderful afternoon!

## 2021-06-09 NOTE — Assessment & Plan Note (Signed)
Patient feels like his blood sugars have been elevated recently but he has been checking them after he eats large meals.  Lowest blood glucose was 78.  Most recent hemoglobin A1c was 5.7 and it was rechecked today and was stable at 5.7.  Patient currently on 12 units of Lantus daily.  Discussed decreasing the dose would like to remain where he is at this time.  We will continue to monitor and if he has low blood sugars we will decrease the dose.  Discussed signs and symptoms of hypoglycemia and patient knows to strict ED and return precautions.  Follow-up in 3 months.

## 2021-06-15 ENCOUNTER — Other Ambulatory Visit: Payer: Self-pay | Admitting: Family Medicine

## 2021-06-20 ENCOUNTER — Encounter: Payer: Self-pay | Admitting: Physical Therapy

## 2021-06-20 ENCOUNTER — Ambulatory Visit: Payer: 59 | Attending: Family Medicine | Admitting: Physical Therapy

## 2021-06-20 ENCOUNTER — Other Ambulatory Visit: Payer: Self-pay

## 2021-06-20 DIAGNOSIS — R2689 Other abnormalities of gait and mobility: Secondary | ICD-10-CM | POA: Insufficient documentation

## 2021-06-20 DIAGNOSIS — M6281 Muscle weakness (generalized): Secondary | ICD-10-CM | POA: Insufficient documentation

## 2021-06-20 NOTE — Therapy (Signed)
[place], [place], [address] Phone: [phone]   Fax:  [phone]  Progress Note Reporting Period 5/25 to 9/14  [name] note below for Objective Data and Assessment of Progress/Goals.    Physical Therapy Treatment  Patient Details  Name: Jeffrey [name] MRN: [id number] Date of Birth: 09/24/1966 Referring Provider (PT): Leeanne Rio, MD   Encounter Date: 06/20/2021   PT End of Session - 06/20/21 1525     Visit Number 16    Number of Visits 30    Date for PT Re-Evaluation 08/15/21    Authorization - Number of Visits 30    PT Start Time 9030    PT Stop Time 1612    PT Time Calculation (min) 42 min    Equipment Utilized During Treatment Gait belt    Activity Tolerance Patient tolerated treatment well    Behavior During Therapy [facility] for tasks assessed/performed             Past Medical History:  Diagnosis Date   Acute renal insufficiency 12/08/2012   AKI (acute kidney injury) ([place])    ALCOHOL ABUSE, HX OF 11/10/2007   Anoxic encephalopathy ([place])    Anxiety    Bleeding ulcer 2014   Blood transfusion 2014   "related to bleeding ulcer"   Cardiac arrest ([place]) 12/07/2012   Anoxic encephalopathy   GERD (gastroesophageal reflux disease)    High cholesterol    Hypertension    OSA on CPAP 12/07/2012   Seizure-like activity ([place])    Seizures ([place])    last seizure 01/05/2021   Type II diabetes mellitus ([place])    Venous insufficiency 12/13/2011    Past Surgical History:  Procedure Laterality Date   BIOPSY  11/01/2020   Procedure: BIOPSY;  Surgeon: Irene Shipper, MD;  Location: [place];  Service: Endoscopy;;   CARDIOVASCULAR STRESS TEST  03/16/13   Very Poor Exercise Tolerance; NON DIAGNOSTIC TEST   CATARACT EXTRACTION W/ INTRAOCULAR LENS IMPLANT Left 03/07/2014   [place] @ [facility]   ESOPHAGOGASTRODUODENOSCOPY Left 11/26/2012   Procedure: ESOPHAGOGASTRODUODENOSCOPY (EGD);  Surgeon:  Wonda Horner, MD;  Location: [facility] ENDOSCOPY;  Service: Endoscopy;  Laterality: Left;   ESOPHAGOGASTRODUODENOSCOPY N/A 10/10/2014   Procedure: ESOPHAGOGASTRODUODENOSCOPY (EGD);  Surgeon: Ladene Artist, MD;  Location: [facility] ENDOSCOPY;  Service: Endoscopy;  Laterality: N/A;   ESOPHAGOGASTRODUODENOSCOPY (EGD) WITH PROPOFOL N/A 11/01/2020   Procedure: ESOPHAGOGASTRODUODENOSCOPY (EGD) WITH PROPOFOL;  Surgeon: Irene Shipper, MD;  Location[facility] ENDOSCOPY;  Service: Endoscopy;  Laterality: N/A;   KNEE ARTHROSCOPY Left ~ 1999   UPPER GASTROINTESTINAL ENDOSCOPY      There were no vitals filed for this visit.   Subjective Assessment - 06/20/21 1533     Subjective Pt reports that he feels he continues to improve.  He has been completing his HEP every other day.  He has 0/10 pain today.    Pertinent History DM, hx of cardiac arrest, HTN, seizures, FALL RISK                [facility] PT Assessment - 06/20/21 0001       Functional Tests   Functional tests Sit to Stand;Other      Sit to Stand   Comments 30'': 6x      Other:   Other/ Comments tandem balance: R:10'' L: 12''              Pulse in sitting 108  Therapeutic Activity - collecting  information for goals, checking progress, and reviewing with patient  Therapeutic Exercise to strengthen LE and core in order to improve upright functional mobility and reduce fall risk.  Pt cued for form and pacing throughout: - Nu-step 5 min L7 while taking subjective - Hurdle walking in // - 3 laps (4 hurdles) with 1 seated rest break  - Step up with contralateral UE support - 4'' step - 2x10 ea   THE FOLLOWING WERE NOT PERFORMED:   - seated march with elbow taps - 10x - Quadruped UE lifts then LE lifts (3x2 ea) (not today) - LE initiated rolling - 2x ea with assist rolling from back to stomach (not today) - ball toss for core activation - seated on dyna disk - seated - yellow ball - 90 degrees of flexion with rotation then...chest pass 1x8,  -  tandem stance - 30'' bouts 2x (CGA) - Lateral step up with contralateral UE support - 4'' step - 12x ea - bkwd walkout - 30# - 4 times - cable machine (holding handles)      PT Short Term Goals - 05/17/21 1534       PT SHORT TERM GOAL #1   [place] will be >75% HEP compliant within 3 weeks to facilitate carryover between sessions and move towards eventual independent management of their condition.    Time 3    Period Weeks    Status Achieved    Target Date 03/21/21               PT Long Term Goals - 06/20/21 1529       PT LONG TERM GOAL #1   Title Jeffrey [name] will improve 30'' STS (MCID 2) to >/= 10x to show improved LE strength and improved transfers.    Baseline 4x 8/16: 5x 9/14: 6x    Time 8    Period Weeks    Status New    Target Date 08/15/21      PT LONG TERM GOAL #2   Title Jeffrey [name] will improve gait speed to .9 m/s (.1 m/s MCID) to show functional improvement in ambulation    Baseline .6m/s 8/16: 1.1    Time 8    Period Weeks    Status Achieved      PT LONG TERM GOAL #3   Title Jeffrey [name] will be able to stand for >30''' in tandem stance, to show a significant improvement in balance in order to reduce fall risk    Baseline unable 8/16: able in semi tandem with CGA, unable tandem 9/14: R in rear: 10'' L in rear 12''    Time 8    Period Weeks    Status New    Target Date 08/15/21      PT LONG TERM GOAL #4   Title Jeffrey [name] will be able to transfer to and from toilet independently    Baseline unable    Time 8    Period Weeks    Status Achieved                   Plan - 06/20/21 [place] has progressed well with therapy.  Improved impairments include: LE strength, balance, core strength.  Functional improvements include: transfers, steadiness in gait.  Progressions needed include: continued core and LE strengthening.  Barriers to progress include: general  deconditioning and complex medical history ([name] precautions).  Please [name]  baseline and/or status section in "Goals" for specific progress on short term and long term goals established at evaluation.  I recommend continuation of PT allow completion of remaining goals and continued functional progression.  Extending POC 2x/week for 8 more weeks (11/09).    Personal Factors and Comorbidities Comorbidity 3+    Comorbidities DM, hx of cardiac arrest, HTN, seizures, FALLS    Examination-Participation Restrictions Church;Community Activity    PT Frequency 2x / week    PT Duration 8 weeks    PT Treatment/Interventions ADLs/Self Care Home Management;Therapeutic exercise;Neuromuscular re-education;Manual techniques;Gait training;Therapeutic activities;Dry needling    PT Next Visit Plan Progress note next session, EVALUATE VITALS, LE strengthening and balance exercises, UE strengthening for transfers and lifting.    PT Home Exercise Plan [phone]    Consulted and Agree with Plan of Care Patient             Patient will benefit from skilled therapeutic intervention in order to improve the following deficits and impairments:  Abnormal gait, Decreased activity tolerance, Decreased knowledge of use of DME, Decreased strength, Impaired UE functional use, Difficulty walking, Decreased balance  Visit Diagnosis: Muscle weakness - Plan: PT plan of care cert/re-cert  Balance problem - Plan: PT plan of care cert/re-cert     Problem List Patient Active Problem List   Diagnosis Date Noted   Grief counseling 01/04/2021   Incontinence 12/23/2020   Hospital discharge follow-up 11/24/2020   Syncope 11/17/2020   Hypomagnesemia 11/05/2020   Hematemesis with nausea    Alcohol abuse with intoxication ([place])    Duodenal ulcer hemorrhagic    GI bleed 10/31/2020   Hyperglycemia 10/31/2020   DKA (diabetic ketoacidosis) ([place]) 07/22/2020   Candidiasis 07/22/2020   Hyponatremia 06/28/2020   Eyebrow laceration,  left, initial encounter    Hypokalemia    Hyperlipidemia associated with type 2 diabetes mellitus (HCC)    MGUS (monoclonal gammopathy of unknown significance) 05/31/2020   Seizure disorder ([place]) 03/16/2020   Type 2 diabetes mellitus with hyperglycemia, with long-term current use of insulin ([place]) 03/16/2020   Tremor 03/16/2020   Colon cancer screening 02/22/2020   Stage 3a chronic kidney disease ([place]) 01/29/2020   Hyperphosphatemia 01/29/2020   Major depressive disorder, recurrent episode ([place]) 01/13/2018   Weakness    Hypotension 07/27/2017   Seizure ([place]) 04/08/2017   Proteinuria 11/23/2015   Essential hypertension    Cirrhosis ([place]) 03/22/2015   Ataxia 03/20/2015   Elevated liver enzymes    Dysarthria 03/09/2015   AKI (acute kidney injury) ([place])    Acute blood loss anemia 11/17/2014   ETOH abuse    Hyperkalemia    Tachycardia    Melena 10/10/2014   Erosive esophagitis 10/10/2014   Somnolence 10/08/2014   Acute kidney injury ([place])    OSA on CPAP 12/28/2012   Cardiac arrest ([place]) 12/07/2012   Anoxic brain injury ([place]) 12/07/2012   ERECTILE DYSFUNCTION 04/14/2007   Type 2 diabetes mellitus with diabetic neuropathy, with long-term current use of insulin ([place]) 12/04/2006   HYPERCHOLESTEROLEMIA 12/04/2006    Mathis Dad, PT 06/20/2021, 4:18 PM  [facility] [address] [place], [place], [address] Phone: [phone]   Fax:  [phone]  Name: Jeffrey [name] MRN: [id number] Date of Birth: [birth year]

## 2021-06-21 ENCOUNTER — Ambulatory Visit: Payer: 59 | Admitting: Physical Therapy

## 2021-06-25 ENCOUNTER — Other Ambulatory Visit: Payer: Self-pay | Admitting: Family Medicine

## 2021-06-26 ENCOUNTER — Other Ambulatory Visit: Payer: Self-pay

## 2021-06-26 ENCOUNTER — Ambulatory Visit: Payer: 59 | Admitting: Physical Therapy

## 2021-06-26 ENCOUNTER — Encounter: Payer: Self-pay | Admitting: Physical Therapy

## 2021-06-26 DIAGNOSIS — M6281 Muscle weakness (generalized): Secondary | ICD-10-CM

## 2021-06-26 DIAGNOSIS — R2689 Other abnormalities of gait and mobility: Secondary | ICD-10-CM

## 2021-06-26 NOTE — Therapy (Signed)
[place], [place], [address] Phone: [phone]   Fax:  [phone]  Physical Therapy Treatment  Patient Details  Name: Jeffrey [name] MRN: [id number] Date of Birth: 03-14-1966 Referring Provider (PT): Leeanne Rio, MD   Encounter Date: 06/26/2021   PT End of Session - 06/26/21 1527     Visit Number 17    Number of Visits 30    Date for PT Re-Evaluation 08/15/21    Authorization - Number of Visits 30    PT Start Time 6433    PT Stop Time 2951    PT Time Calculation (min) 44 min    Equipment Utilized During Treatment Gait belt    Activity Tolerance Patient tolerated treatment well    Behavior During Therapy [facility] for tasks assessed/performed             Past Medical History:  Diagnosis Date   Acute renal insufficiency 12/08/2012   AKI (acute kidney injury) ([place])    ALCOHOL ABUSE, HX OF 11/10/2007   Anoxic encephalopathy ([place])    Anxiety    Bleeding ulcer 2014   Blood transfusion 2014   "related to bleeding ulcer"   Cardiac arrest ([place]) 12/07/2012   Anoxic encephalopathy   GERD (gastroesophageal reflux disease)    High cholesterol    Hypertension    OSA on CPAP 12/07/2012   Seizure-like activity ([place])    Seizures ([place])    last seizure 01/05/2021   Type II diabetes mellitus ([place])    Venous insufficiency 12/13/2011    Past Surgical History:  Procedure Laterality Date   BIOPSY  11/01/2020   Procedure: BIOPSY;  Surgeon: Irene Shipper, MD;  Location: [place];  Service: Endoscopy;;   CARDIOVASCULAR STRESS TEST  03/16/13   Very Poor Exercise Tolerance; NON DIAGNOSTIC TEST   CATARACT EXTRACTION W/ INTRAOCULAR LENS IMPLANT Left 03/07/2014   [place] @ [facility]   ESOPHAGOGASTRODUODENOSCOPY Left 11/26/2012   Procedure: ESOPHAGOGASTRODUODENOSCOPY (EGD);  Surgeon: Wonda Horner, MD;  Location: [facility] ENDOSCOPY;  Service: Endoscopy;  Laterality: Left;   ESOPHAGOGASTRODUODENOSCOPY N/A  10/10/2014   Procedure: ESOPHAGOGASTRODUODENOSCOPY (EGD);  Surgeon: Ladene Artist, MD;  Location: [facility] ENDOSCOPY;  Service: Endoscopy;  Laterality: N/A;   ESOPHAGOGASTRODUODENOSCOPY (EGD) WITH PROPOFOL N/A 11/01/2020   Procedure: ESOPHAGOGASTRODUODENOSCOPY (EGD) WITH PROPOFOL;  Surgeon: Irene Shipper, MD;  Location: [facility] ENDOSCOPY;  Service: Endoscopy;  Laterality: N/A;   KNEE ARTHROSCOPY Left ~ 1999   UPPER GASTROINTESTINAL ENDOSCOPY      There were no vitals filed for this visit.   Subjective Assessment - 06/26/21 1534     Subjective Pt reports that he is feeling more fatigued today, he's not sure why.  He has been HEP compliant.  He has 0/10 pain today.    Pertinent History DM, hx of cardiac arrest, HTN, seizures, FALL RISK                                       Pulse 108 at rest   Therapeutic Exercise to strengthen LE and core in order to improve upright functional mobility and reduce fall risk.  Pt cued for form and pacing throughout: - Nu-step 5 min L9 while taking subjective (LE only next visit) - Step up with contralateral UE support - 4'' step - 2x10 ea - Lateral step up with contralateral UE support - 4'' step -  12x ea (not today) - bkwd walkout - 30# - 4 times - cable machine (holding handles) (not today) - tandem stance - 30'' bouts 2x (CGA) (not today) - Hurdles - 4 hurdles - in // - 3 laps - Sit to stand 5x ea from 62 cm, 60 cm, 58 cm   THE FOLLOWING WERE NOT PERFORMED:   - seated march with elbow taps - 10x - Quadruped UE lifts then LE lifts (3x2 ea) (not today) - LE initiated rolling - 2x ea with assist rolling from back to stomach (not today) - Hurdle walking in // - 6 laps (4 hurdles) with 1 seated rest break (not today)     PT Short Term Goals - 05/17/21 1534       PT SHORT TERM GOAL #1   Title Jeffrey [name] will be >75% HEP compliant within 3 weeks to facilitate carryover between sessions and move towards eventual independent  management of their condition.    Time 3    Period Weeks    Status Achieved    Target Date 03/21/21               PT Long Term Goals - 06/20/21 1529       PT LONG TERM GOAL #1   Title Jeffrey [name] will improve 30'' STS (MCID 2) to >/= 10x to show improved LE strength and improved transfers.    Baseline 4x 8/16: 5x 9/14: 6x    Time 8    Period Weeks    Status New    Target Date 08/15/21      PT LONG TERM GOAL #2   Title Jeffrey [name] will improve gait speed to .9 m/s (.1 m/s MCID) to show functional improvement in ambulation    Baseline .54m/s 8/16: 1.1    Time 8    Period Weeks    Status Achieved      PT LONG TERM GOAL #3   Title Jeffrey [name] will be able to stand for >30''' in tandem stance, to show a significant improvement in balance in order to reduce fall risk    Baseline unable 8/16: able in semi tandem with CGA, unable tandem 9/14: R in rear: 10'' L in rear 12''    Time 8    Period Weeks    Status New    Target Date 08/15/21      PT LONG TERM GOAL #4   Title Jeffrey [name] will be able to transfer to and from toilet independently    Baseline unable    Time 8    Period Weeks    Status Achieved                   Plan - 06/26/21 1558     Clinical Impression Statement Pt reports no increase in baseline pain following therapy  HEP was reviewed, but left unchanged    Overall, Jeffrey [name] is progressing fair with therapy.  Today we concentrated on lower extremity strengthening, normalizing gait, and increasing activity tolerance.  Pt shows improved clearance during hurdle walking today.  He does require cuing for foot and UE placement as well as weight shift; a deficit L>R remains.  Pt will continue to benefit from skilled physical therapy to address remaining deficits and achieve listed goals.  Continue per POC.    Personal Factors and Comorbidities Comorbidity 3+    Comorbidities DM, hx of cardiac arrest, HTN, seizures, FALLS  Examination-Participation Restrictions Church;Community Activity    PT Frequency 2x / week    PT Duration 8 weeks    PT Treatment/Interventions ADLs/Self Care Home Management;Therapeutic exercise;Neuromuscular re-education;Manual techniques;Gait training;Therapeutic activities;Dry needling    PT Next Visit Plan Progress note next session, EVALUATE VITALS, LE strengthening and balance exercises, UE strengthening for transfers and lifting.    PT Home Exercise Plan [phone]    Consulted and Agree with Plan of Care Patient             Patient will benefit from skilled therapeutic intervention in order to improve the following deficits and impairments:  Abnormal gait, Decreased activity tolerance, Decreased knowledge of use of DME, Decreased strength, Impaired UE functional use, Difficulty walking, Decreased balance  Visit Diagnosis: Muscle weakness  Balance problem  Other abnormalities of gait and mobility     Problem List Patient Active Problem List   Diagnosis Date Noted   Grief counseling 01/04/2021   Incontinence 12/23/2020   Hospital discharge follow-up 11/24/2020   Syncope 11/17/2020   Hypomagnesemia 11/05/2020   Hematemesis with nausea    Alcohol abuse with intoxication ([place])    Duodenal ulcer hemorrhagic    GI bleed 10/31/2020   Hyperglycemia 10/31/2020   DKA (diabetic ketoacidosis) ([place]) 07/22/2020   Candidiasis 07/22/2020   Hyponatremia 06/28/2020   Eyebrow laceration, left, initial encounter    Hypokalemia    Hyperlipidemia associated with type 2 diabetes mellitus (HCC)    MGUS (monoclonal gammopathy of unknown significance) 05/31/2020   Seizure disorder ([place]) 03/16/2020   Type 2 diabetes mellitus with hyperglycemia, with long-term current use of insulin ([place]) 03/16/2020   Tremor 03/16/2020   Colon cancer screening 02/22/2020   Stage 3a chronic kidney disease ([place]) 01/29/2020   Hyperphosphatemia 01/29/2020   Major depressive disorder, recurrent episode ([place])  01/13/2018   Weakness    Hypotension 07/27/2017   Seizure ([place]) 04/08/2017   Proteinuria 11/23/2015   Essential hypertension    Cirrhosis ([place]) 03/22/2015   Ataxia 03/20/2015   Elevated liver enzymes    Dysarthria 03/09/2015   AKI (acute kidney injury) ([place])    Acute blood loss anemia 11/17/2014   ETOH abuse    Hyperkalemia    Tachycardia    Melena 10/10/2014   Erosive esophagitis 10/10/2014   Somnolence 10/08/2014   Acute kidney injury ([place])    OSA on CPAP 12/28/2012   Cardiac arrest ([place]) 12/07/2012   Anoxic brain injury ([place]) 12/07/2012   ERECTILE DYSFUNCTION 04/14/2007   Type 2 diabetes mellitus with diabetic neuropathy, with long-term current use of insulin ([place]) 12/04/2006   HYPERCHOLESTEROLEMIA 12/04/2006    Jeffrey [name], PT 06/26/2021, 4:15 PM  [place] [facility] [address] [place], [place], [address] Phone: [phone]   Fax:  [phone]  Name: Jeffrey [name] MRN: [id number] Date of Birth: 1966-06-14

## 2021-06-28 ENCOUNTER — Ambulatory Visit: Payer: 59 | Admitting: Physical Therapy

## 2021-06-28 ENCOUNTER — Encounter: Payer: Self-pay | Admitting: Physical Therapy

## 2021-06-28 ENCOUNTER — Other Ambulatory Visit: Payer: Self-pay

## 2021-06-28 DIAGNOSIS — M6281 Muscle weakness (generalized): Secondary | ICD-10-CM

## 2021-06-28 DIAGNOSIS — R2689 Other abnormalities of gait and mobility: Secondary | ICD-10-CM

## 2021-06-28 NOTE — Patient Instructions (Signed)
Access Code: [email or web address] URL: [email or web address] Date: 06/28/2021 Prepared by: Shearon Balo  Exercises Seated Hip Abduction with Resistance - 1 x daily - 7 x weekly - 3 sets - 10 reps Sit to Stand with Counter Support - 1 x daily - 7 x weekly - 3 sets - 4 reps Seated March with Resistance - 1 x daily - 7 x weekly - 3 sets - 10 reps Standing Romberg to 1/2 Tandem Stance - 1 x daily - 7 x weekly - 1 sets - 3 reps - 30 hold Sitting Knee Extension with Resistance - 1 x daily - 7 x weekly - 2 sets - 10 reps

## 2021-06-28 NOTE — Therapy (Signed)
[place], [place], [address] Phone: [phone]   Fax:  [phone]  Physical Therapy Treatment  Patient Details  Name: Jeffrey [name] MRN: [id number] Date of Birth: October 19, 1965 Referring Provider (PT): Leeanne Rio, MD   Encounter Date: 06/28/2021   PT End of Session - 06/28/21 1542     Visit Number 18    Number of Visits 30    Date for PT Re-Evaluation 08/15/21    Authorization - Number of Visits 30    PT Start Time 0086    PT Stop Time 1630    PT Time Calculation (min) 45 min    Equipment Utilized During Treatment Gait belt    Activity Tolerance Patient tolerated treatment well    Behavior During Therapy [facility] for tasks assessed/performed             Past Medical History:  Diagnosis Date   Acute renal insufficiency 12/08/2012   AKI (acute kidney injury) ([place])    ALCOHOL ABUSE, HX OF 11/10/2007   Anoxic encephalopathy ([place])    Anxiety    Bleeding ulcer 2014   Blood transfusion 2014   "related to bleeding ulcer"   Cardiac arrest ([place]) 12/07/2012   Anoxic encephalopathy   GERD (gastroesophageal reflux disease)    High cholesterol    Hypertension    OSA on CPAP 12/07/2012   Seizure-like activity ([place])    Seizures ([place])    last seizure 01/05/2021   Type II diabetes mellitus ([place])    Venous insufficiency 12/13/2011    Past Surgical History:  Procedure Laterality Date   BIOPSY  11/01/2020   Procedure: BIOPSY;  Surgeon: Irene Shipper, MD;  Location: [place];  Service: Endoscopy;;   CARDIOVASCULAR STRESS TEST  03/16/13   Very Poor Exercise Tolerance; NON DIAGNOSTIC TEST   CATARACT EXTRACTION W/ INTRAOCULAR LENS IMPLANT Left 03/07/2014   [place] @ [facility]   ESOPHAGOGASTRODUODENOSCOPY Left 11/26/2012   Procedure: ESOPHAGOGASTRODUODENOSCOPY (EGD);  Surgeon: Wonda Horner, MD;  Location: [facility] ENDOSCOPY;  Service: Endoscopy;  Laterality: Left;   ESOPHAGOGASTRODUODENOSCOPY N/A  10/10/2014   Procedure: ESOPHAGOGASTRODUODENOSCOPY (EGD);  Surgeon: Ladene Artist, MD;  Location: [facility] ENDOSCOPY;  Service: Endoscopy;  Laterality: N/A;   ESOPHAGOGASTRODUODENOSCOPY (EGD) WITH PROPOFOL N/A 11/01/2020   Procedure: ESOPHAGOGASTRODUODENOSCOPY (EGD) WITH PROPOFOL;  Surgeon: Irene Shipper, MD;  Location: [facility] ENDOSCOPY;  Service: Endoscopy;  Laterality: N/A;   KNEE ARTHROSCOPY Left ~ 1999   UPPER GASTROINTESTINAL ENDOSCOPY      There were no vitals filed for this visit.       [facility] PT Assessment - 06/28/21 0001       Other:   Other/ Comments tandem balance: R:10'' L: 20''              PT Education - 06/28/21 1553     Education Details HEP    Person(s) Educated Patient    Methods Handout    Comprehension Verbalized understanding            Pulse 116 at rest   Therapeutic Exercise to strengthen LE and core in order to improve upright functional mobility and reduce fall risk.  Pt cued for form and pacing throughout: - Nu-step 5 min L5 while taking subjective - LE only - Step up with contralateral UE support - 4'' step - 2x10 ea (not today) - Sled push/pull 50' - 10# added - 2x - Lateral step up with contralateral UE support - 4''  step - 12x ea (not today) - tandem stance - 30'' bouts 2x (CGA) - Hurdles - 4 hurdles - in // - 3 laps (L more difficult than R) - Sit to stand 2x4 ea from 56 cm   THE FOLLOWING WERE NOT PERFORMED:   - seated march with elbow taps - 10x - Quadruped UE lifts then LE lifts (3x2 ea) (not today) - LE initiated rolling - 2x ea with assist rolling from back to stomach (not today) - Hurdle walking in // - 6 laps (4 hurdles) with 1 seated rest break (not today)     PT Short Term Goals - 05/17/21 1534       PT SHORT TERM GOAL #1   [place] will be >75% HEP compliant within 3 weeks to facilitate carryover between sessions and move towards eventual independent management of their condition.    Time 3    Period Weeks     Status Achieved    Target Date 03/21/21               PT Long Term Goals - 06/20/21 1529       PT LONG TERM GOAL #1   Title [name] will improve 30'' STS (MCID 2) to >/= 10x to show improved LE strength and improved transfers.    Baseline 4x 8/16: 5x 9/14: 6x    Time 8    Period Weeks    Status New    Target Date 08/15/21      PT LONG TERM GOAL #2   Title NIKOLAY DEMETRIOU will improve gait speed to .9 m/s (.1 m/s MCID) to show functional improvement in ambulation    Baseline .42m/s 8/16: 1.1    Time 8    Period Weeks    Status Achieved      PT LONG TERM GOAL #3   Title ALVON NYGAARD will be able to stand for >30''' in tandem stance, to show a significant improvement in balance in order to reduce fall risk    Baseline unable 8/16: able in semi tandem with CGA, unable tandem 9/14: R in rear: 10'' L in rear 12''    Time 8    Period Weeks    Status New    Target Date 08/15/21      PT LONG TERM GOAL #4   Title KARANDEEP RESENDE will be able to transfer to and from toilet independently    Baseline unable    Time 8    Period Weeks    Status Achieved                   Plan - 06/28/21 1555     Clinical Impression Statement Pt reports no increase in baseline pain following therapy  HEP was updated and reissued to patient; pt educated on HEP, was provided handout, and verbally confirmed understanding of exercises.    Overall, RAYNOR CALCATERRA is progressing well with therapy.  Today we concentrated on lower extremity strengthening and functional strengthening.  Pt continues to make slow progress toward goals.  He shows more consistent clearance of hurdles today and also shows reduced fatigue and improved activity tolerance.  Pt will continue to benefit from skilled physical therapy to address remaining deficits and achieve listed goals.  Continue per POC.    Personal Factors and Comorbidities Comorbidity 3+    Comorbidities DM, hx of cardiac arrest, HTN,  seizures, FALLS    Examination-Participation Restrictions Church;Community Activity  PT Frequency 2x / week    PT Duration 8 weeks    PT Treatment/Interventions ADLs/Self Care Home Management;Therapeutic exercise;Neuromuscular re-education;Manual techniques;Gait training;Therapeutic activities;Dry needling    PT Next Visit Plan Progress note next session, EVALUATE VITALS, LE strengthening and balance exercises, UE strengthening for transfers and lifting.    PT Home Exercise Plan [phone]    Consulted and Agree with Plan of Care Patient             Patient will benefit from skilled therapeutic intervention in order to improve the following deficits and impairments:  Abnormal gait, Decreased activity tolerance, Decreased knowledge of use of DME, Decreased strength, Impaired UE functional use, Difficulty walking, Decreased balance  Visit Diagnosis: Muscle weakness  Balance problem  Other abnormalities of gait and mobility     Problem List Patient Active Problem List   Diagnosis Date Noted   Grief counseling 01/04/2021   Incontinence 12/23/2020   Hospital discharge follow-up 11/24/2020   Syncope 11/17/2020   Hypomagnesemia 11/05/2020   Hematemesis with nausea    Alcohol abuse with intoxication ([place])    Duodenal ulcer hemorrhagic    GI bleed 10/31/2020   Hyperglycemia 10/31/2020   DKA (diabetic ketoacidosis) ([place]) 07/22/2020   Candidiasis 07/22/2020   Hyponatremia 06/28/2020   Eyebrow laceration, left, initial encounter    Hypokalemia    Hyperlipidemia associated with type 2 diabetes mellitus (HCC)    MGUS (monoclonal gammopathy of unknown significance) 05/31/2020   Seizure disorder ([place]) 03/16/2020   Type 2 diabetes mellitus with hyperglycemia, with long-term current use of insulin ([place]) 03/16/2020   Tremor 03/16/2020   Colon cancer screening 02/22/2020   Stage 3a chronic kidney disease ([place]) 01/29/2020   Hyperphosphatemia 01/29/2020   Major depressive disorder,  recurrent episode ([place]) 01/13/2018   Weakness    Hypotension 07/27/2017   Seizure ([place]) 04/08/2017   Proteinuria 11/23/2015   Essential hypertension    Cirrhosis ([place]) 03/22/2015   Ataxia 03/20/2015   Elevated liver enzymes    Dysarthria 03/09/2015   AKI (acute kidney injury) ([place])    Acute blood loss anemia 11/17/2014   ETOH abuse    Hyperkalemia    Tachycardia    Melena 10/10/2014   Erosive esophagitis 10/10/2014   Somnolence 10/08/2014   Acute kidney injury ([place])    OSA on CPAP 12/28/2012   Cardiac arrest ([place]) 12/07/2012   Anoxic brain injury ([place]) 12/07/2012   ERECTILE DYSFUNCTION 04/14/2007   Type 2 diabetes mellitus with diabetic neuropathy, with long-term current use of insulin ([place]) 12/04/2006   HYPERCHOLESTEROLEMIA 12/04/2006    Mathis Dad, PT 06/28/2021, 4:32 PM  [facility] [address] [place], [place], [address] Phone: [phone]   Fax:  [phone]  Name: HILLARY SCHWEGLER MRN: [id number] Date of Birth: Feb 25, 1966

## 2021-07-03 ENCOUNTER — Other Ambulatory Visit: Payer: Self-pay

## 2021-07-03 ENCOUNTER — Ambulatory Visit: Payer: 59 | Admitting: Physical Therapy

## 2021-07-03 ENCOUNTER — Encounter: Payer: Self-pay | Admitting: Physical Therapy

## 2021-07-03 DIAGNOSIS — M6281 Muscle weakness (generalized): Secondary | ICD-10-CM

## 2021-07-03 DIAGNOSIS — R2689 Other abnormalities of gait and mobility: Secondary | ICD-10-CM

## 2021-07-03 NOTE — Therapy (Signed)
[place], [place], [address] Phone: [phone]   Fax:  [phone]  Physical Therapy Treatment  Patient Details  Name: Jeffrey [name] MRN: [id number] Date of Birth: 03-Jan-1966 Referring Provider (PT): Leeanne Rio, MD   Encounter Date: 07/03/2021   PT End of Session - 07/03/21 1616     Visit Number 19    Number of Visits 30    Date for PT Re-Evaluation 08/15/21    Authorization - Number of Visits 30    PT Start Time 0415    PT Stop Time 0500    PT Time Calculation (min) 45 min    Equipment Utilized During Treatment Gait belt    Activity Tolerance Patient tolerated treatment well    Behavior During Therapy [facility] for tasks assessed/performed             Past Medical History:  Diagnosis Date   Acute renal insufficiency 12/08/2012   AKI (acute kidney injury) ([place])    ALCOHOL ABUSE, HX OF 11/10/2007   Anoxic encephalopathy ([place])    Anxiety    Bleeding ulcer 2014   Blood transfusion 2014   "related to bleeding ulcer"   Cardiac arrest ([place]) 12/07/2012   Anoxic encephalopathy   GERD (gastroesophageal reflux disease)    High cholesterol    Hypertension    OSA on CPAP 12/07/2012   Seizure-like activity ([place])    Seizures ([place])    last seizure 01/05/2021   Type II diabetes mellitus ([place])    Venous insufficiency 12/13/2011    Past Surgical History:  Procedure Laterality Date   BIOPSY  11/01/2020   Procedure: BIOPSY;  Surgeon: Irene Shipper, MD;  Location: [facility];  Service: Endoscopy;;   CARDIOVASCULAR STRESS TEST  03/16/13   Very Poor Exercise Tolerance; NON DIAGNOSTIC TEST   CATARACT EXTRACTION W/ INTRAOCULAR LENS IMPLANT Left 03/07/2014   [facility] @ [facility]   ESOPHAGOGASTRODUODENOSCOPY Left 11/26/2012   Procedure: ESOPHAGOGASTRODUODENOSCOPY (EGD);  Surgeon: Wonda Horner, MD;  Location: [facility] ENDOSCOPY;  Service: Endoscopy;  Laterality: Left;   ESOPHAGOGASTRODUODENOSCOPY N/A  10/10/2014   Procedure: ESOPHAGOGASTRODUODENOSCOPY (EGD);  Surgeon: Ladene Artist, MD;  Location: [facility] ENDOSCOPY;  Service: Endoscopy;  Laterality: N/A;   ESOPHAGOGASTRODUODENOSCOPY (EGD) WITH PROPOFOL N/A 11/01/2020   Procedure: ESOPHAGOGASTRODUODENOSCOPY (EGD) WITH PROPOFOL;  Surgeon: Irene Shipper, MD;  Location: [facility] ENDOSCOPY;  Service: Endoscopy;  Laterality: N/A;   KNEE ARTHROSCOPY Left ~ 1999   UPPER GASTROINTESTINAL ENDOSCOPY      There were no vitals filed for this visit.   Subjective Assessment - 07/03/21 1621     Subjective Pt reports that he has noticed some increase in his tremor this weekend, he is not sure why.  He thinks he has had slow progress.  He has been working on hip flexion at home.  He has 0/10 pain today.    Pertinent History DM, hx of cardiac arrest, HTN, seizures, FALL RISK              Pulse 120 at rest (monitored throughout at 115 - 135)   Therapeutic Exercise to strengthen LE and core in order to improve upright functional mobility and reduce fall risk.  Pt cued for form and pacing throughout: - Nu-step 5 min L6 while taking subjective - LE only - Step up with contralateral UE support - 4'' step - 210 ea (not today) - Sled push/pull 50' - 10# added - 2x (not today) - Lateral  step up with contralateral UE support - 4'' step - 10x - tandem stance - 30'' bouts 2x (CGA) - Hurdles - 4 hurdles - in // - 2 laps (L more difficult than R) - Sit to stand 3x3 ea from 56 cm   THE FOLLOWING WERE NOT PERFORMED:   - seated march with elbow taps - 10x - Quadruped UE lifts then LE lifts (3x2 ea) (not today) - LE initiated rolling - 2x ea with assist rolling from back to stomach (not today) - Hurdle walking in // - 6 laps (4 hurdles) with 1 seated rest break (not today)     PT Short Term Goals - 05/17/21 1534       PT SHORT TERM GOAL #1   [place] will be >75% HEP compliant within 3 weeks to facilitate carryover between sessions and move towards  eventual independent management of their condition.    Time 3    Period Weeks    Status Achieved    Target Date 03/21/21               PT Long Term Goals - 06/20/21 1529       PT LONG TERM GOAL #1   Title Jeffrey [name] will improve 30'' STS (MCID 2) to >/= 10x to show improved LE strength and improved transfers.    Baseline 4x 8/16: 5x 9/14: 6x    Time 8    Period Weeks    Status New    Target Date 08/15/21      PT LONG TERM GOAL #2   Title Jeffrey [name] will improve gait speed to .9 m/s (.1 m/s MCID) to show functional improvement in ambulation    Baseline .29m/s 8/16: 1.1    Time 8    Period Weeks    Status Achieved      PT LONG TERM GOAL #3   Title Jeffrey [name] will be able to stand for >30''' in tandem stance, to show a significant improvement in balance in order to reduce fall risk    Baseline unable 8/16: able in semi tandem with CGA, unable tandem 9/14: R in rear: 10'' L in rear 12''    Time 8    Period Weeks    Status New    Target Date 08/15/21      PT LONG TERM GOAL #4   Title Jeffrey [name] will be able to transfer to and from toilet independently    Baseline unable    Time 8    Period Weeks    Status Achieved                   Plan - 07/03/21 1642     Clinical Impression Statement Pt reports no increase in baseline pain following therapy  HEP was reviewed, but left unchanged    Overall, Jeffrey [name] is progressing well with therapy.  Today we concentrated onLE strengthening and functional strengthening.  Pt continues to show improvement in hurdle clearance and endurance with 2 laps without rest today.  He does touch several hurdles on the second lap but none were flipped over.  He continues to be limited by fatigue and weakness, but both are slowly improving.  Pt will continue to benefit from skilled physical therapy to address remaining deficits and achieve listed goals.  Continue per POC.    Personal Factors and  Comorbidities Comorbidity 3+    Comorbidities DM, hx of cardiac arrest, HTN,  seizures, FALLS    Examination-Participation Restrictions Church;Community Activity    PT Frequency 2x / week    PT Duration 8 weeks    PT Treatment/Interventions ADLs/Self Care Home Management;Therapeutic exercise;Neuromuscular re-education;Manual techniques;Gait training;Therapeutic activities;Dry needling    PT Next Visit Plan Progress note next session, EVALUATE VITALS, LE strengthening and balance exercises, UE strengthening for transfers and lifting.    PT Home Exercise Plan [phone]    Consulted and Agree with Plan of Care Patient             Patient will benefit from skilled therapeutic intervention in order to improve the following deficits and impairments:  Abnormal gait, Decreased activity tolerance, Decreased knowledge of use of DME, Decreased strength, Impaired UE functional use, Difficulty walking, Decreased balance  Visit Diagnosis: Muscle weakness  Balance problem  Other abnormalities of gait and mobility     Problem List Patient Active Problem List   Diagnosis Date Noted   Grief counseling 01/04/2021   Incontinence 12/23/2020   Hospital discharge follow-up 11/24/2020   Syncope 11/17/2020   Hypomagnesemia 11/05/2020   Hematemesis with nausea    Alcohol abuse with intoxication ([place])    Duodenal ulcer hemorrhagic    GI bleed 10/31/2020   Hyperglycemia 10/31/2020   DKA (diabetic ketoacidosis) ([place]) 07/22/2020   Candidiasis 07/22/2020   Hyponatremia 06/28/2020   Eyebrow laceration, left, initial encounter    Hypokalemia    Hyperlipidemia associated with type 2 diabetes mellitus (HCC)    MGUS (monoclonal gammopathy of unknown significance) 05/31/2020   Seizure disorder ([place]) 03/16/2020   Type 2 diabetes mellitus with hyperglycemia, with long-term current use of insulin ([place]) 03/16/2020   Tremor 03/16/2020   Colon cancer screening 02/22/2020   Stage 3a chronic kidney disease  ([place]) 01/29/2020   Hyperphosphatemia 01/29/2020   Major depressive disorder, recurrent episode ([place]) 01/13/2018   Weakness    Hypotension 07/27/2017   Seizure ([place]) 04/08/2017   Proteinuria 11/23/2015   Essential hypertension    Cirrhosis ([place]) 03/22/2015   Ataxia 03/20/2015   Elevated liver enzymes    Dysarthria 03/09/2015   AKI (acute kidney injury) ([place])    Acute blood loss anemia 11/17/2014   ETOH abuse    Hyperkalemia    Tachycardia    Melena 10/10/2014   Erosive esophagitis 10/10/2014   Somnolence 10/08/2014   Acute kidney injury ([place])    OSA on CPAP 12/28/2012   Cardiac arrest ([place]) 12/07/2012   Anoxic brain injury ([place]) 12/07/2012   ERECTILE DYSFUNCTION 04/14/2007   Type 2 diabetes mellitus with diabetic neuropathy, with long-term current use of insulin ([place]) 12/04/2006   HYPERCHOLESTEROLEMIA 12/04/2006    Jeffrey [name], PT 07/03/2021, 5:00 PM  [facility] [address] [place], [place], [address] Phone: [phone]   Fax:  [phone]  Name: Jeffrey [name] MRN: [id number] Date of Birth: [birth year]

## 2021-07-05 ENCOUNTER — Other Ambulatory Visit: Payer: Self-pay

## 2021-07-05 ENCOUNTER — Ambulatory Visit: Payer: 59 | Admitting: Physical Therapy

## 2021-07-05 ENCOUNTER — Encounter: Payer: Self-pay | Admitting: Physical Therapy

## 2021-07-05 DIAGNOSIS — R2689 Other abnormalities of gait and mobility: Secondary | ICD-10-CM

## 2021-07-05 DIAGNOSIS — M6281 Muscle weakness (generalized): Secondary | ICD-10-CM | POA: Diagnosis not present

## 2021-07-05 NOTE — Therapy (Signed)
Jeffrey [name], [place], [address] Phone: [phone]   Fax:  [phone]  Physical Therapy Treatment  Patient Details  Name: Jeffrey [name] MRN: [id number] Date of Birth: [birth year] Referring Provider (PT): Leeanne Rio, MD   Encounter Date: 07/05/2021   PT End of Session - 07/05/21 1620     Visit Number 20    Number of Visits 30    Date for PT Re-Evaluation 08/15/21    Authorization - Number of Visits 30    PT Start Time 9798    PT Stop Time 1700    PT Time Calculation (min) 45 min    Equipment Utilized During Treatment Gait belt    Activity Tolerance Patient tolerated treatment well    Behavior During Therapy [facility] for tasks assessed/performed             Past Medical History:  Diagnosis Date   Acute renal insufficiency 12/08/2012   AKI (acute kidney injury) ([place])    ALCOHOL ABUSE, HX OF 11/10/2007   Anoxic encephalopathy ([place])    Anxiety    Bleeding ulcer 2014   Blood transfusion 2014   "related to bleeding ulcer"   Cardiac arrest ([place]) 12/07/2012   Anoxic encephalopathy   GERD (gastroesophageal reflux disease)    High cholesterol    Hypertension    OSA on CPAP 12/07/2012   Seizure-like activity ([place])    Seizures ([place])    last seizure 01/05/2021   Type II diabetes mellitus ([place])    Venous insufficiency 12/13/2011    Past Surgical History:  Procedure Laterality Date   BIOPSY  11/01/2020   Procedure: BIOPSY;  Surgeon: Irene Shipper, MD;  Location: [facility];  Service: Endoscopy;;   CARDIOVASCULAR STRESS TEST  03/16/13   Very Poor Exercise Tolerance; NON DIAGNOSTIC TEST   CATARACT EXTRACTION W/ INTRAOCULAR LENS IMPLANT Left 03/07/2014   [place] @ [facility]   ESOPHAGOGASTRODUODENOSCOPY Left 11/26/2012   Procedure: ESOPHAGOGASTRODUODENOSCOPY (EGD);  Surgeon: Wonda Horner, MD;  Location: [facility] ENDOSCOPY;  Service: Endoscopy;  Laterality: Left;   ESOPHAGOGASTRODUODENOSCOPY N/A  10/10/2014   Procedure: ESOPHAGOGASTRODUODENOSCOPY (EGD);  Surgeon: Ladene Artist, MD;  Location: [facility] ENDOSCOPY;  Service: Endoscopy;  Laterality: N/A;   ESOPHAGOGASTRODUODENOSCOPY (EGD) WITH PROPOFOL N/A 11/01/2020   Procedure: ESOPHAGOGASTRODUODENOSCOPY (EGD) WITH PROPOFOL;  Surgeon: Irene Shipper, MD;  Location: [facility] ENDOSCOPY;  Service: Endoscopy;  Laterality: N/A;   KNEE ARTHROSCOPY Left ~ 1999   UPPER GASTROINTESTINAL ENDOSCOPY      There were no vitals filed for this visit.    Pulse 118 at rest (monitored throughout at 115 - 135)     Therapeutic Exercise to strengthen LE and core in order to improve upright functional mobility and reduce fall risk.  Pt cued for form and pacing throughout: - Nu-step 5 min L7 while taking subjective - LE only   THE FOLLOWING WERE NOT PERFORMED:  - Step up with contralateral UE support - 4'' step - 210 ea (not today) - Sled push/pull 50' - 10# added - 2x (not today) - Lateral step up with contralateral UE support - 4'' step - 10x - tandem stance - 30'' bouts 2x (CGA) - Hurdles - 4 hurdles - in // - 2 laps (L more difficult than R) - Sit to stand 3x3 ea from 56 cm - seated march with elbow taps - 10x - Quadruped UE lifts then LE lifts (3x2 ea) (not today) - LE initiated rolling -  2x ea with assist rolling from back to stomach (not today) - Hurdle walking in // - 6 laps (4 hurdles) with 1 seated rest break (not today)  Therapeutic Activity - working on floor transfer progression - lunge to bolster on floor to standing with bil UE support 2x3 ea, L leg in front is stronger - 1x from tall kneeling (max assist required - monitoring vitals throughout      PT Short Term Goals - 05/17/21 1534       PT SHORT TERM GOAL #1   [name] will be >75% HEP compliant within 3 weeks to facilitate carryover between sessions and move towards eventual independent management of their condition.    Time 3    Period Weeks    Status Achieved     Target Date 03/21/21               PT Long Term Goals - 07/05/21 1627       PT LONG TERM GOAL #1   Title [name] will improve 30'' STS (MCID 2) to >/= 10x to show improved LE strength and improved transfers. 11/9    Baseline 4x 8/16: 5x 9/14: 6x 9/29: 7x    Time 8    Period Weeks    Status New      PT LONG TERM GOAL #2   Title [name] will improve gait speed to .9 m/s (.1 m/s MCID) to show functional improvement in ambulation    Baseline .50m/s 8/16: 1.1    Time 8    Period Weeks    Status Achieved      PT LONG TERM GOAL #3   Title [name] will be able to stand for >30''' in tandem stance, to show a significant improvement in balance in order to reduce fall risk    Baseline unable 8/16: able in semi tandem with CGA, unable tandem 9/14: R in rear: 10'' L in rear 12''    Time 8    Period Weeks    Status New      PT LONG TERM GOAL #4   Title [name] will be able to transfer to and from toilet independently    Baseline unable    Time 8    Period Weeks    Status Achieved                   Plan - 07/05/21 1704     Clinical Impression Statement Pt reports no increase in baseline pain following therapy  HEP was reviewed, but left unchanged    Overall, [name] is progressing well with therapy.  Today we concentrated on core strengthening and functional strengthening.  Pt shows significant LE and core strength deficit R>L.  He does well with modified lunge, but is unable to return to standing from 1/2 kneeling without max assist x1.  We will continue to work on functional strengthening to improve safety with transfers.  Pt will continue to benefit from skilled physical therapy to address remaining deficits and achieve listed goals.  Continue per POC.    Personal Factors and Comorbidities Comorbidity 3+    Comorbidities DM, hx of cardiac arrest, HTN, seizures, FALLS    Examination-Participation Restrictions Church;Community  Activity    PT Frequency 2x / week    PT Duration 8 weeks    PT Treatment/Interventions ADLs/Self Care Home Management;Therapeutic exercise;Neuromuscular re-education;Manual techniques;Gait training;Therapeutic activities;Dry needling    PT Next Visit  Plan Progress note next session, EVALUATE VITALS, LE strengthening and balance exercises, UE strengthening for transfers and lifting.    PT Home Exercise Plan [phone]    Consulted and Agree with Plan of Care Patient             Patient will benefit from skilled therapeutic intervention in order to improve the following deficits and impairments:  Abnormal gait, Decreased activity tolerance, Decreased knowledge of use of DME, Decreased strength, Impaired UE functional use, Difficulty walking, Decreased balance  Visit Diagnosis: Muscle weakness  Balance problem  Other abnormalities of gait and mobility     Problem List Patient Active Problem List   Diagnosis Date Noted   Grief counseling 01/04/2021   Incontinence 12/23/2020   Hospital discharge follow-up 11/24/2020   Syncope 11/17/2020   Hypomagnesemia 11/05/2020   Hematemesis with nausea    Alcohol abuse with intoxication ([place])    Duodenal ulcer hemorrhagic    GI bleed 10/31/2020   Hyperglycemia 10/31/2020   DKA (diabetic ketoacidosis) ([place]) 07/22/2020   Candidiasis 07/22/2020   Hyponatremia 06/28/2020   Eyebrow laceration, left, initial encounter    Hypokalemia    Hyperlipidemia associated with type 2 diabetes mellitus (HCC)    MGUS (monoclonal gammopathy of unknown significance) 05/31/2020   Seizure disorder ([place]) 03/16/2020   Type 2 diabetes mellitus with hyperglycemia, with long-term current use of insulin ([place]) 03/16/2020   Tremor 03/16/2020   Colon cancer screening 02/22/2020   Stage 3a chronic kidney disease ([place]) 01/29/2020   Hyperphosphatemia 01/29/2020   Major depressive disorder, recurrent episode ([place]) 01/13/2018   Weakness    Hypotension 07/27/2017    Seizure ([place]) 04/08/2017   Proteinuria 11/23/2015   Essential hypertension    Cirrhosis ([place]) 03/22/2015   Ataxia 03/20/2015   Elevated liver enzymes    Dysarthria 03/09/2015   AKI (acute kidney injury) ([place])    Acute blood loss anemia 11/17/2014   ETOH abuse    Hyperkalemia    Tachycardia    Melena 10/10/2014   Erosive esophagitis 10/10/2014   Somnolence 10/08/2014   Acute kidney injury ([place])    OSA on CPAP 12/28/2012   Cardiac arrest ([place]) 12/07/2012   Anoxic brain injury ([place]) 12/07/2012   ERECTILE DYSFUNCTION 04/14/2007   Type 2 diabetes mellitus with diabetic neuropathy, with long-term current use of insulin ([place]) 12/04/2006   HYPERCHOLESTEROLEMIA 12/04/2006    Mathis Dad, PT 07/05/2021, 5:05 PM  [facility] [address] [place], [place], [address] Phone: [phone]   Fax:  [phone]  Name: JANELLE CULTON MRN: [id number] Date of Birth: August 26, 1966

## 2021-07-11 ENCOUNTER — Encounter: Payer: Self-pay | Admitting: Physical Therapy

## 2021-07-11 ENCOUNTER — Ambulatory Visit: Payer: 59 | Attending: Family Medicine | Admitting: Physical Therapy

## 2021-07-11 ENCOUNTER — Other Ambulatory Visit: Payer: Self-pay

## 2021-07-11 DIAGNOSIS — M6281 Muscle weakness (generalized): Secondary | ICD-10-CM | POA: Diagnosis present

## 2021-07-11 DIAGNOSIS — R2689 Other abnormalities of gait and mobility: Secondary | ICD-10-CM | POA: Diagnosis present

## 2021-07-11 NOTE — Therapy (Signed)
[facility] [place], [place], [address] Phone: [phone]   Fax:  [phone]  Physical Therapy Treatment  Patient Details  Name: Jeffrey [name] MRN: [id number] Date of Birth: October 15, 1965 Referring Provider (PT): Leeanne Rio, MD   Encounter Date: 07/11/2021   PT End of Session - 07/11/21 1401     Visit Number 21    Number of Visits 30    Date for PT Re-Evaluation 08/15/21    Authorization - Number of Visits 30    PT Start Time 1400    PT Stop Time 1440    PT Time Calculation (min) 40 min    Equipment Utilized During Treatment Gait belt    Activity Tolerance Patient tolerated treatment well    Behavior During Therapy [facility] for tasks assessed/performed             Past Medical History:  Diagnosis Date   Acute renal insufficiency 12/08/2012   AKI (acute kidney injury) ([place])    ALCOHOL ABUSE, HX OF 11/10/2007   Anoxic encephalopathy ([place])    Anxiety    Bleeding ulcer 2014   Blood transfusion 2014   "related to bleeding ulcer"   Cardiac arrest ([place]) 12/07/2012   Anoxic encephalopathy   GERD (gastroesophageal reflux disease)    High cholesterol    Hypertension    OSA on CPAP 12/07/2012   Seizure-like activity ([place])    Seizures ([place])    last seizure 01/05/2021   Type II diabetes mellitus ([place])    Venous insufficiency 12/13/2011    Past Surgical History:  Procedure Laterality Date   BIOPSY  11/01/2020   Procedure: BIOPSY;  Surgeon: Irene Shipper, MD;  Location: [place];  Service: Endoscopy;;   CARDIOVASCULAR STRESS TEST  03/16/13   Very Poor Exercise Tolerance; NON DIAGNOSTIC TEST   CATARACT EXTRACTION W/ INTRAOCULAR LENS IMPLANT Left 03/07/2014   [place] @ [facility]   ESOPHAGOGASTRODUODENOSCOPY Left 11/26/2012   Procedure: ESOPHAGOGASTRODUODENOSCOPY (EGD);  Surgeon: Wonda Horner, MD;  Location[facility] END[facility]COPY;  Service: Endoscopy;  Laterality: Left;   ESOPHAGOGASTRODUODENOSCOPY N/A  10/10/2014   Procedure: ESOPHAGOGASTRODUODENOSCOPY (EGD);  Surgeon: Ladene Artist, MD;  Location: [facility] ENDOSCOPY;  Service: Endoscopy;  Laterality: N/A;   ESOPHAGOGASTRODUODENOSCOPY (EGD) WITH PROPOFOL N/A 11/01/2020   Procedure: ESOPHAGOGASTRODUODENOSCOPY (EGD) WITH PROPOFOL;  Surgeon: Irene Shipper, MD;  Location: [facility] ENDOSCOPY;  Service: Endoscopy;  Laterality: N/A;   KNEE ARTHROSCOPY Left ~ 1999   UPPER GASTROINTESTINAL ENDOSCOPY      There were no vitals filed for this visit.   Subjective Assessment - 07/11/21 1406     Subjective Pt reports that he has been working on gettin up and down from the floor at home using 2 chairs.  He reports he was able to get up 2/5 time with assistance the other times at home.   He has 0/10 pain today.    Pertinent History DM, hx of cardiac arrest, HTN, seizures, FALL RISK              Pulse: 110 at rest today (stayed between 109 - 135 throughout)  Therapeutic Exercise to strengthen LE and core in order to improve upright functional mobility and reduce fall risk.  Pt cued for form and pacing throughout: - Nu-step 5 min L7 while taking subjective - LE only - Hurdle walking in // - 4 laps (4 hurdles)   THE FOLLOWING WERE NOT PERFORMED:   - Step up with contralateral UE  support - 4'' step - 210 ea (not today) - Sled push/pull 50' - 10# added - 2x (not today) - Lateral step up with contralateral UE support - 4'' step - 10x - tandem stance - 30'' bouts 2x (CGA) - Sit to stand 3x3 ea from 56 cm - seated march with elbow taps - 10x - Quadruped UE lifts then LE lifts (3x2 ea) (not today) - LE initiated rolling - 2x ea with assist rolling from back to stomach (not today)   Therapeutic Activity - working on floor transfer progression - lunge to bolster on floor to standing with bil UE support -L LE in front: 4x (mod assist on 4), 5x (mod assist on 5) R LE in front: 3x (mod assist on 2x), 5x (mod assist on 5) - monitoring vitals throughout     PT  Short Term Goals - 05/17/21 1534       PT SHORT TERM GOAL #1   Title Jeffrey [name] will be >75% HEP compliant within 3 weeks to facilitate carryover between sessions and move towards eventual independent management of their condition.    Time 3    Period Weeks    Status Achieved    Target Date 03/21/21               PT Long Term Goals - 07/05/21 1627       PT LONG TERM GOAL #1   Title Jeffrey [name] will improve 30'' STS (MCID 2) to >/= 10x to show improved LE strength and improved transfers. 11/9    Baseline 4x 8/16: 5x 9/14: 6x 9/29: 7x    Time 8    Period Weeks    Status New      PT LONG TERM GOAL #2   Title Jeffrey [name] will improve gait speed to .9 m/s (.1 m/s MCID) to show functional improvement in ambulation    Baseline .50m/s 8/16: 1.1    Time 8    Period Weeks    Status Achieved      PT LONG TERM GOAL #3   Title Jeffrey [name] will be able to stand for >30''' in tandem stance, to show a significant improvement in balance in order to reduce fall risk    Baseline unable 8/16: able in semi tandem with CGA, unable tandem 9/14: R in rear: 10'' L in rear 12''    Time 8    Period Weeks    Status New      PT LONG TERM GOAL #4   Title Jeffrey [name] will be able to transfer to and from toilet independently    Baseline unable    Time [place] - 07/11/21 1421     Clinical Impression Statement Pt reports no increase in baseline pain following therapy  HEP was reviewed, but left unchanged    Overall, Jeffrey [name] is progressing well with therapy.  Today we concentrated on functional strengthening, normalizing gait, and increasing activity tolerance.  Pt continues to show improvement in hurdle clearance, clearing 7/8 hurdles with his L foot first try.  He continues to show deficits with clearance L>R but both L and R swing is improving with hurdles and in gait.  His lunge transfers are  improving as well.  Pt will continue to benefit from skilled physical therapy to  address remaining deficits and achieve listed goals.  Continue per POC.    Personal Factors and Comorbidities Comorbidity 3+    Comorbidities DM, hx of cardiac arrest, HTN, seizures, FALLS    Examination-Participation Restrictions Church;Community Activity    PT Frequency 2x / week    PT Duration 8 weeks    PT Treatment/Interventions ADLs/Self Care Home Management;Therapeutic exercise;Neuromuscular re-education;Manual techniques;Gait training;Therapeutic activities;Dry needling    PT Next Visit Plan Progress note next session, EVALUATE VITALS, LE strengthening and balance exercises, UE strengthening for transfers and lifting.    PT Home Exercise Plan [phone]    Consulted and Agree with Plan of Care Patient             Patient will benefit from skilled therapeutic intervention in order to improve the following deficits and impairments:  Abnormal gait, Decreased activity tolerance, Decreased knowledge of use of DME, Decreased strength, Impaired UE functional use, Difficulty walking, Decreased balance  Visit Diagnosis: Muscle weakness  Balance problem  Other abnormalities of gait and mobility     Problem List Patient Active Problem List   Diagnosis Date Noted   Grief counseling 01/04/2021   Incontinence 12/23/2020   Hospital discharge follow-up 11/24/2020   Syncope 11/17/2020   Hypomagnesemia 11/05/2020   Hematemesis with nausea    Alcohol abuse with intoxication ([place])    Duodenal ulcer hemorrhagic    GI bleed 10/31/2020   Hyperglycemia 10/31/2020   DKA (diabetic ketoacidosis) ([place]) 07/22/2020   Candidiasis 07/22/2020   Hyponatremia 06/28/2020   Eyebrow laceration, left, initial encounter    Hypokalemia    Hyperlipidemia associated with type 2 diabetes mellitus (HCC)    MGUS (monoclonal gammopathy of unknown significance) 05/31/2020   Seizure disorder ([place]) 03/16/2020   Type 2 diabetes  mellitus with hyperglycemia, with long-term current use of insulin ([place]) 03/16/2020   Tremor 03/16/2020   Colon cancer screening 02/22/2020   Stage 3a chronic kidney disease ([place]) 01/29/2020   Hyperphosphatemia 01/29/2020   Major depressive disorder, recurrent episode ([place]) 01/13/2018   Weakness    Hypotension 07/27/2017   Seizure ([place]) 04/08/2017   Proteinuria 11/23/2015   Essential hypertension    Cirrhosis ([place]) 03/22/2015   Ataxia 03/20/2015   Elevated liver enzymes    Dysarthria 03/09/2015   AKI (acute kidney injury) ([place])    Acute blood loss anemia 11/17/2014   ETOH abuse    Hyperkalemia    Tachycardia    Melena 10/10/2014   Erosive esophagitis 10/10/2014   Somnolence 10/08/2014   Acute kidney injury ([place])    OSA on CPAP 12/28/2012   Cardiac arrest ([place]) 12/07/2012   Anoxic brain injury ([place]) 12/07/2012   ERECTILE DYSFUNCTION 04/14/2007   Type 2 diabetes mellitus with diabetic neuropathy, with long-term current use of insulin ([place]) 12/04/2006   HYPERCHOLESTEROLEMIA 12/04/2006    Mathis Dad, PT 07/11/2021, 2:47 PM  [facility] [address] [place], [place], [address] Phone: [phone]   Fax:  [phone]  Name: Jeffrey [name] MRN: [id number] Date of Birth: 07/30/1966

## 2021-07-13 ENCOUNTER — Other Ambulatory Visit: Payer: Self-pay | Admitting: Family Medicine

## 2021-07-13 ENCOUNTER — Ambulatory Visit: Payer: 59 | Admitting: Physical Therapy

## 2021-07-13 ENCOUNTER — Encounter: Payer: Self-pay | Admitting: Physical Therapy

## 2021-07-13 ENCOUNTER — Other Ambulatory Visit: Payer: Self-pay

## 2021-07-13 DIAGNOSIS — R2689 Other abnormalities of gait and mobility: Secondary | ICD-10-CM

## 2021-07-13 DIAGNOSIS — M6281 Muscle weakness (generalized): Secondary | ICD-10-CM

## 2021-07-13 NOTE — Therapy (Addendum)
[place], [place], [address] Phone: [phone]   Fax:  [phone]  PHYSICAL THERAPY UNPLANNED DISCHARGE SUMMARY   Visits from Start of Care: 22  Current functional level related to goals / functional outcomes: Current status unknown   Remaining deficits: Current status unknown   Education / Equipment: Pt has not returned since visit listed below  Patient goals were not assessed. Patient is being discharged due to not returning since the last visit.  (the below note was addended to include the above D/C summary on 09/07/21)  Physical Therapy Treatment  Patient Details  Name: Jeffrey [name] MRN: [id number] Date of Birth: Jul 22, 1966 Referring Provider (PT): Leeanne Rio, MD   Encounter Date: 07/13/2021   PT End of Session - 07/13/21 1244     Visit Number 22    Number of Visits 30    Date for PT Re-Evaluation 08/15/21    Authorization - Number of Visits 30    PT Start Time 1244    PT Stop Time 0263    PT Time Calculation (min) 45 min    Equipment Utilized During Treatment Gait belt    Activity Tolerance Patient tolerated treatment well    Behavior During Therapy [facility] for tasks assessed/performed             Past Medical History:  Diagnosis Date   Acute renal insufficiency 12/08/2012   AKI (acute kidney injury) ([place])    ALCOHOL ABUSE, HX OF 11/10/2007   Anoxic encephalopathy ([place])    Anxiety    Bleeding ulcer 2014   Blood transfusion 2014   "related to bleeding ulcer"   Cardiac arrest ([place]) 12/07/2012   Anoxic encephalopathy   GERD (gastroesophageal reflux disease)    High cholesterol    Hypertension    OSA on CPAP 12/07/2012   Seizure-like activity ([place])    Seizures ([place])    last seizure 01/05/2021   Type II diabetes mellitus ([place])    Venous insufficiency 12/13/2011    Past Surgical History:  Procedure Laterality Date   BIOPSY  11/01/2020   Procedure: BIOPSY;  Surgeon:  Irene Shipper, MD;  Location: [place];  Service: Endoscopy;;   CARDIOVASCULAR STRESS TEST  03/16/13   Very Poor Exercise Tolerance; NON DIAGNOSTIC TEST   CATARACT EXTRACTION W/ INTRAOCULAR LENS IMPLANT Left 03/07/2014   [place] @ [facility]   ESOPHAGOGASTRODUODENOSCOPY Left 11/26/2012   Procedure: ESOPHAGOGASTRODUODENOSCOPY (EGD);  Surgeon: Wonda Horner, MD;  Location: [facility] ENDOSCOPY;  Service: Endoscopy;  Laterality: Left;   ESOPHAGOGASTRODUODENOSCOPY N/A 10/10/2014   Procedure: ESOPHAGOGASTRODUODENOSCOPY (EGD);  Surgeon: Ladene Artist, MD;  Location: [facility] ENDOSCOPY;  Service: Endoscopy;  Laterality: N/A;   ESOPHAGOGASTRODUODENOSCOPY (EGD) WITH PROPOFOL N/A 11/01/2020   Procedure: ESOPHAGOGASTRODUODENOSCOPY (EGD) WITH PROPOFOL;  Surgeon: Irene Shipper, MD;  Location: [facility] ENDOSCOPY;  Service: Endoscopy;  Laterality: N/A;   KNEE ARTHROSCOPY Left ~ 1999   UPPER GASTROINTESTINAL ENDOSCOPY      There were no vitals filed for this visit.   Subjective Assessment - 07/13/21 1250     Subjective Pt reports he has been HEP compliant and has been working on on lifting his knees while in a seated position.  He feels he is having slow improvement.  He has 0/10 pain today.    Pertinent History DM, hx of cardiac arrest, HTN, seizures, FALL RISK  Pulse: 115 at rest today (stayed between 109 - 125 throughout)   Therapeutic Exercise to strengthen LE and core in order to improve upright functional mobility and reduce fall risk.  Pt cued for form and pacing throughout: - Nu-step 5 min L8 while taking subjective - LE only - leg press - 4x10 @65 # - knee ext machine - 4x10 @ 10# - knee flexion machine - 3x10 @ 20# - seated row - 25# - concentration on erect posture to activate core - 4x10    THE FOLLOWING WERE NOT PERFORMED:   - Hurdle walking in // - 4 laps (4 hurdles)  - Sit to stand 3x3 ea from 56 cm - Step up with ipsilateral  UE support - 4'' step - 210 ea - Sled push/pull 50' - 10# added - 2x - Lateral step up with contralateral UE support - 4'' step - 10x - tandem stance - 30'' bouts 2x (CGA) - seated march with elbow taps - 10x - Quadruped UE lifts then LE lifts (3x2 ea) (not today) - LE initiated rolling - 2x ea with assist rolling from back to stomach (not today)      PT Short Term Goals - 05/17/21 1534       PT SHORT TERM GOAL #1   Title [name] will be >75% HEP compliant within 3 weeks to facilitate carryover between sessions and move towards eventual independent management of their condition.    Time 3    Period Weeks    Status Achieved    Target Date 03/21/21               PT Long Term Goals - 07/05/21 1627       PT LONG TERM GOAL #1   Title [name] will improve 30'' STS (MCID 2) to >/= 10x to show improved LE strength and improved transfers. 11/9    Baseline 4x 8/16: 5x 9/14: 6x 9/29: 7x    Time 8    Period Weeks    Status New      PT LONG TERM GOAL #2   Title SHEENA SIMONIS will improve gait speed to .9 m/s (.1 m/s MCID) to show functional improvement in ambulation    Baseline .28m/s 8/16: 1.1    Time 8    Period Weeks    Status Achieved      PT LONG TERM GOAL #3   Title MIKIAS LANZ will be able to stand for >30''' in tandem stance, to show a significant improvement in balance in order to reduce fall risk    Baseline unable 8/16: able in semi tandem with CGA, unable tandem 9/14: R in rear: 10'' L in rear 12''    Time 8    Period Weeks    Status New      PT LONG TERM GOAL #4   Title DEMARQUES PILZ will be able to transfer to and from toilet independently    Baseline unable    Time 8    Period Weeks    Status Achieved                   Plan - 07/13/21 1254     Clinical Impression Statement Pt reports no increase in baseline pain following therapy  HEP was reviewed, but left unchanged    Overall, AMIN FORNWALT is progressing  well with therapy.  Today we concentrated on core strengthening, lower extremity strengthening, quad strengthening, and hip strengthening.  Today we shifted to more specific LE muscle strengthening.  Pt does well with this.  Pt does require cuing for proper form and pacing.  Quad weakness is particularly significant.  Pt will continue to benefit from skilled physical therapy to address remaining deficits and achieve listed goals.  Continue per POC.    Personal Factors and Comorbidities Comorbidity 3+    Comorbidities DM, hx of cardiac arrest, HTN, seizures, FALLS    Examination-Participation Restrictions Church;Community Activity    PT Frequency 2x / week    PT Duration 8 weeks    PT Treatment/Interventions ADLs/Self Care Home Management;Therapeutic exercise;Neuromuscular re-education;Manual techniques;Gait training;Therapeutic activities;Dry needling    PT Next Visit Plan Progress note next session, EVALUATE VITALS, LE strengthening and balance exercises, UE strengthening for transfers and lifting.    PT Home Exercise Plan [phone]    Consulted and Agree with Plan of Care Patient             Patient will benefit from skilled therapeutic intervention in order to improve the following deficits and impairments:  Abnormal gait, Decreased activity tolerance, Decreased knowledge of use of DME, Decreased strength, Impaired UE functional use, Difficulty walking, Decreased balance  Visit Diagnosis: Muscle weakness  Balance problem  Other abnormalities of gait and mobility     Problem List Patient Active Problem List   Diagnosis Date Noted   Grief counseling 01/04/2021   Incontinence 12/23/2020   Hospital discharge follow-up 11/24/2020   Syncope 11/17/2020   Hypomagnesemia 11/05/2020   Hematemesis with nausea    Alcohol abuse with intoxication ([place])    Duodenal ulcer hemorrhagic    GI bleed 10/31/2020   Hyperglycemia 10/31/2020   DKA (diabetic ketoacidosis) ([place]) 07/22/2020    Candidiasis 07/22/2020   Hyponatremia 06/28/2020   Eyebrow laceration, left, initial encounter    Hypokalemia    Hyperlipidemia associated with type 2 diabetes mellitus (HCC)    MGUS (monoclonal gammopathy of unknown significance) 05/31/2020   Seizure disorder ([place]) 03/16/2020   Type 2 diabetes mellitus with hyperglycemia, with long-term current use of insulin ([place]) 03/16/2020   Tremor 03/16/2020   Colon cancer screening 02/22/2020   Stage 3a chronic kidney disease ([place]) 01/29/2020   Hyperphosphatemia 01/29/2020   Major depressive disorder, recurrent episode ([place]) 01/13/2018   Weakness    Hypotension 07/27/2017   Seizure ([place]) 04/08/2017   Proteinuria 11/23/2015   Essential hypertension    Cirrhosis ([place]) 03/22/2015   Ataxia 03/20/2015   Elevated liver enzymes    Dysarthria 03/09/2015   AKI (acute kidney injury) ([place])    Acute blood loss anemia 11/17/2014   ETOH abuse    Hyperkalemia    Tachycardia    Melena 10/10/2014   Erosive esophagitis 10/10/2014   Somnolence 10/08/2014   Acute kidney injury ([place])    OSA on CPAP 12/28/2012   Cardiac arrest ([place]) 12/07/2012   Anoxic brain injury ([place]) 12/07/2012   ERECTILE DYSFUNCTION 04/14/2007   Type 2 diabetes mellitus with diabetic neuropathy, with long-term current use of insulin ([place]) 12/04/2006   HYPERCHOLESTEROLEMIA 12/04/2006    Mathis Dad, PT 07/13/2021, 1:17 PM  [facility] [address] [place], [place], [address] Phone: [phone]   Fax:  [phone]  Name: TRAVONE GEORG MRN: [id number] Date of Birth: Nov 14, 1965

## 2021-07-17 ENCOUNTER — Ambulatory Visit: Payer: 59 | Admitting: Physical Therapy

## 2021-07-19 ENCOUNTER — Other Ambulatory Visit: Payer: Self-pay

## 2021-07-19 ENCOUNTER — Encounter: Payer: Self-pay | Admitting: Physical Therapy

## 2021-07-19 ENCOUNTER — Ambulatory Visit: Payer: 59 | Admitting: Physical Therapy

## 2021-07-19 DIAGNOSIS — R2689 Other abnormalities of gait and mobility: Secondary | ICD-10-CM

## 2021-07-19 DIAGNOSIS — M6281 Muscle weakness (generalized): Secondary | ICD-10-CM

## 2021-07-19 NOTE — Therapy (Signed)
[place], [place], [address] Phone: [phone]   Fax:  [phone]  Patient Details  Name: Jeffrey [name] MRN: [id number] Date of Birth: [birth year] Referring Provider:  [name], MD  Encounter Date: 07/19/2021  Pt arrives today with reports of increased fatigue over the last 4 days.  Pulse taken at 130 bpm at rest.  Pt clearly fatigued and has increased difficulty walking into gym area than is typical d/t fatigue.  Denies SOB or chest pain.  No sign of stroke.  Denies any sxs other than fatigue.  I recommended that he see his MD today or go to ED or urgent care.  He agrees to plan and states he will get transportation to take him directly to his MD office.  Mathis Dad 07/19/2021, 4:44 PM  [place], [place], [address] Phone: [phone]   Fax:  [phone]

## 2021-07-25 ENCOUNTER — Other Ambulatory Visit: Payer: Self-pay | Admitting: Family Medicine

## 2021-07-29 ENCOUNTER — Inpatient Hospital Stay (HOSPITAL_COMMUNITY): Payer: 59

## 2021-07-29 ENCOUNTER — Inpatient Hospital Stay (HOSPITAL_COMMUNITY)
Admission: EM | Admit: 2021-07-29 | Discharge: 2021-08-10 | DRG: 371 | Disposition: A | Discharge status: Skilled Nursing Facility | Payer: 59 | Attending: Family Medicine | Admitting: Family Medicine

## 2021-07-29 ENCOUNTER — Other Ambulatory Visit: Payer: Self-pay

## 2021-07-29 DIAGNOSIS — Y902 Blood alcohol level of 40-59 mg/100 ml: Secondary | ICD-10-CM | POA: Diagnosis present

## 2021-07-29 DIAGNOSIS — R9431 Abnormal electrocardiogram [ECG] [EKG]: Secondary | ICD-10-CM | POA: Diagnosis present

## 2021-07-29 DIAGNOSIS — F10239 Alcohol dependence with withdrawal, unspecified: Secondary | ICD-10-CM | POA: Diagnosis present

## 2021-07-29 DIAGNOSIS — Z888 Allergy status to other drugs, medicaments and biological substances status: Secondary | ICD-10-CM

## 2021-07-29 DIAGNOSIS — Z720 Tobacco use: Secondary | ICD-10-CM

## 2021-07-29 DIAGNOSIS — E1140 Type 2 diabetes mellitus with diabetic neuropathy, unspecified: Secondary | ICD-10-CM | POA: Diagnosis present

## 2021-07-29 DIAGNOSIS — E11649 Type 2 diabetes mellitus with hypoglycemia without coma: Secondary | ICD-10-CM | POA: Diagnosis present

## 2021-07-29 DIAGNOSIS — E7800 Pure hypercholesterolemia, unspecified: Secondary | ICD-10-CM | POA: Diagnosis present

## 2021-07-29 DIAGNOSIS — R296 Repeated falls: Secondary | ICD-10-CM | POA: Diagnosis not present

## 2021-07-29 DIAGNOSIS — R531 Weakness: Secondary | ICD-10-CM

## 2021-07-29 DIAGNOSIS — E871 Hypo-osmolality and hyponatremia: Secondary | ICD-10-CM | POA: Diagnosis present

## 2021-07-29 DIAGNOSIS — A0472 Enterocolitis due to Clostridium difficile, not specified as recurrent: Secondary | ICD-10-CM | POA: Diagnosis present

## 2021-07-29 DIAGNOSIS — E860 Dehydration: Secondary | ICD-10-CM | POA: Diagnosis present

## 2021-07-29 DIAGNOSIS — Z23 Encounter for immunization: Secondary | ICD-10-CM

## 2021-07-29 DIAGNOSIS — R680 Hypothermia, not associated with low environmental temperature: Secondary | ICD-10-CM | POA: Diagnosis present

## 2021-07-29 DIAGNOSIS — E43 Unspecified severe protein-calorie malnutrition: Secondary | ICD-10-CM | POA: Diagnosis present

## 2021-07-29 DIAGNOSIS — Z794 Long term (current) use of insulin: Secondary | ICD-10-CM

## 2021-07-29 DIAGNOSIS — K7460 Unspecified cirrhosis of liver: Secondary | ICD-10-CM | POA: Diagnosis present

## 2021-07-29 DIAGNOSIS — Z20822 Contact with and (suspected) exposure to covid-19: Secondary | ICD-10-CM | POA: Diagnosis present

## 2021-07-29 DIAGNOSIS — N1831 Chronic kidney disease, stage 3a: Secondary | ICD-10-CM | POA: Diagnosis present

## 2021-07-29 DIAGNOSIS — G931 Anoxic brain damage, not elsewhere classified: Secondary | ICD-10-CM | POA: Diagnosis present

## 2021-07-29 DIAGNOSIS — Z8711 Personal history of peptic ulcer disease: Secondary | ICD-10-CM

## 2021-07-29 DIAGNOSIS — G40909 Epilepsy, unspecified, not intractable, without status epilepticus: Secondary | ICD-10-CM | POA: Diagnosis present

## 2021-07-29 DIAGNOSIS — N529 Male erectile dysfunction, unspecified: Secondary | ICD-10-CM | POA: Diagnosis present

## 2021-07-29 DIAGNOSIS — R404 Transient alteration of awareness: Secondary | ICD-10-CM | POA: Diagnosis not present

## 2021-07-29 DIAGNOSIS — E1122 Type 2 diabetes mellitus with diabetic chronic kidney disease: Secondary | ICD-10-CM | POA: Diagnosis present

## 2021-07-29 DIAGNOSIS — E669 Obesity, unspecified: Secondary | ICD-10-CM | POA: Diagnosis present

## 2021-07-29 DIAGNOSIS — I130 Hypertensive heart and chronic kidney disease with heart failure and stage 1 through stage 4 chronic kidney disease, or unspecified chronic kidney disease: Secondary | ICD-10-CM | POA: Diagnosis present

## 2021-07-29 DIAGNOSIS — G9341 Metabolic encephalopathy: Secondary | ICD-10-CM | POA: Diagnosis not present

## 2021-07-29 DIAGNOSIS — N179 Acute kidney failure, unspecified: Secondary | ICD-10-CM | POA: Diagnosis present

## 2021-07-29 DIAGNOSIS — E8809 Other disorders of plasma-protein metabolism, not elsewhere classified: Secondary | ICD-10-CM

## 2021-07-29 DIAGNOSIS — E162 Hypoglycemia, unspecified: Secondary | ICD-10-CM

## 2021-07-29 DIAGNOSIS — F10129 Alcohol abuse with intoxication, unspecified: Secondary | ICD-10-CM | POA: Diagnosis not present

## 2021-07-29 DIAGNOSIS — E876 Hypokalemia: Secondary | ICD-10-CM | POA: Diagnosis present

## 2021-07-29 DIAGNOSIS — Z91199 Patient's noncompliance with other medical treatment and regimen due to unspecified reason: Secondary | ICD-10-CM

## 2021-07-29 DIAGNOSIS — G928 Other toxic encephalopathy: Secondary | ICD-10-CM | POA: Diagnosis present

## 2021-07-29 DIAGNOSIS — K2100 Gastro-esophageal reflux disease with esophagitis, without bleeding: Secondary | ICD-10-CM | POA: Diagnosis present

## 2021-07-29 DIAGNOSIS — E1169 Type 2 diabetes mellitus with other specified complication: Secondary | ICD-10-CM | POA: Diagnosis present

## 2021-07-29 DIAGNOSIS — Z79899 Other long term (current) drug therapy: Secondary | ICD-10-CM

## 2021-07-29 DIAGNOSIS — Z6835 Body mass index (BMI) 35.0-35.9, adult: Secondary | ICD-10-CM

## 2021-07-29 DIAGNOSIS — Z7984 Long term (current) use of oral hypoglycemic drugs: Secondary | ICD-10-CM

## 2021-07-29 DIAGNOSIS — E875 Hyperkalemia: Secondary | ICD-10-CM

## 2021-07-29 DIAGNOSIS — I5032 Chronic diastolic (congestive) heart failure: Secondary | ICD-10-CM | POA: Diagnosis present

## 2021-07-29 DIAGNOSIS — Z7982 Long term (current) use of aspirin: Secondary | ICD-10-CM

## 2021-07-29 DIAGNOSIS — E1165 Type 2 diabetes mellitus with hyperglycemia: Secondary | ICD-10-CM | POA: Diagnosis present

## 2021-07-29 DIAGNOSIS — R55 Syncope and collapse: Secondary | ICD-10-CM | POA: Diagnosis not present

## 2021-07-29 DIAGNOSIS — Z833 Family history of diabetes mellitus: Secondary | ICD-10-CM

## 2021-07-29 DIAGNOSIS — F32A Depression, unspecified: Secondary | ICD-10-CM | POA: Diagnosis present

## 2021-07-29 DIAGNOSIS — I951 Orthostatic hypotension: Secondary | ICD-10-CM | POA: Diagnosis present

## 2021-07-29 DIAGNOSIS — D649 Anemia, unspecified: Secondary | ICD-10-CM | POA: Diagnosis present

## 2021-07-29 DIAGNOSIS — G4733 Obstructive sleep apnea (adult) (pediatric): Secondary | ICD-10-CM | POA: Diagnosis present

## 2021-07-29 DIAGNOSIS — Z8674 Personal history of sudden cardiac arrest: Secondary | ICD-10-CM

## 2021-07-29 DIAGNOSIS — R339 Retention of urine, unspecified: Secondary | ICD-10-CM | POA: Diagnosis not present

## 2021-07-29 DIAGNOSIS — Z9114 Patient's other noncompliance with medication regimen: Secondary | ICD-10-CM

## 2021-07-29 LAB — CBC WITH DIFFERENTIAL/PLATELET
Abs Immature Granulocytes: 0.02 10*3/uL (ref 0.00–0.07)
Basophils Absolute: 0 10*3/uL (ref 0.0–0.1)
Basophils Relative: 0 %
Eosinophils Absolute: 0 10*3/uL (ref 0.0–0.5)
Eosinophils Relative: 0 %
HCT: 33 % — ABNORMAL LOW (ref 39.0–52.0)
Hemoglobin: 12.1 g/dL — ABNORMAL LOW (ref 13.0–17.0)
Immature Granulocytes: 0 %
Lymphocytes Relative: 26 %
Lymphs Abs: 2.1 10*3/uL (ref 0.7–4.0)
MCH: 31.1 pg (ref 26.0–34.0)
MCHC: 36.7 g/dL — ABNORMAL HIGH (ref 30.0–36.0)
MCV: 84.8 fL (ref 80.0–100.0)
Monocytes Absolute: 0.8 10*3/uL (ref 0.1–1.0)
Monocytes Relative: 10 %
Neutro Abs: 5.3 10*3/uL (ref 1.7–7.7)
Neutrophils Relative %: 64 %
Platelets: 204 10*3/uL (ref 150–400)
RBC: 3.89 MIL/uL — ABNORMAL LOW (ref 4.22–5.81)
RDW: 17.3 % — ABNORMAL HIGH (ref 11.5–15.5)
WBC: 8.3 10*3/uL (ref 4.0–10.5)
nRBC: 0 % (ref 0.0–0.2)

## 2021-07-29 LAB — CBG MONITORING, ED
Glucose-Capillary: 157 mg/dL — ABNORMAL HIGH (ref 70–99)
Glucose-Capillary: 162 mg/dL — ABNORMAL HIGH (ref 70–99)

## 2021-07-29 LAB — RESP PANEL BY RT-PCR (FLU A&B, COVID) ARPGX2
Influenza A by PCR: NEGATIVE
Influenza A by PCR: NEGATIVE
Influenza B by PCR: NEGATIVE
Influenza B by PCR: NEGATIVE
SARS Coronavirus 2 by RT PCR: NEGATIVE
SARS Coronavirus 2 by RT PCR: NEGATIVE

## 2021-07-29 LAB — COMPREHENSIVE METABOLIC PANEL
ALT: 48 U/L — ABNORMAL HIGH (ref 0–44)
AST: 83 U/L — ABNORMAL HIGH (ref 15–41)
Albumin: <1.5 g/dL — ABNORMAL LOW (ref 3.5–5.0)
Alkaline Phosphatase: 195 U/L — ABNORMAL HIGH (ref 38–126)
Anion gap: 19 — ABNORMAL HIGH (ref 5–15)
BUN: 5 mg/dL — ABNORMAL LOW (ref 6–20)
CO2: 25 mmol/L (ref 22–32)
Calcium: 5.8 mg/dL — CL (ref 8.9–10.3)
Chloride: 85 mmol/L — ABNORMAL LOW (ref 98–111)
Creatinine, Ser: 1.41 mg/dL — ABNORMAL HIGH (ref 0.61–1.24)
GFR, Estimated: 58 mL/min — ABNORMAL LOW (ref >60)
Glucose, Bld: 70 mg/dL (ref 70–99)
Potassium: <2 mmol/L — CL (ref 3.5–5.1)
Sodium: 129 mmol/L — ABNORMAL LOW (ref 135–145)
Total Bilirubin: 1.6 mg/dL — ABNORMAL HIGH (ref 0.3–1.2)
Total Protein: 6 g/dL — ABNORMAL LOW (ref 6.5–8.1)

## 2021-07-29 LAB — ETHANOL: Alcohol, Ethyl (B): 42 mg/dL — ABNORMAL HIGH (ref <10)

## 2021-07-29 LAB — TROPONIN I (HIGH SENSITIVITY): Troponin I (High Sensitivity): 23 ng/L — ABNORMAL HIGH (ref <18)

## 2021-07-29 LAB — MAGNESIUM
Magnesium: 0.7 mg/dL — CL (ref 1.7–2.4)
Magnesium: 1.1 mg/dL — ABNORMAL LOW (ref 1.7–2.4)

## 2021-07-29 LAB — AMMONIA: Ammonia: 61 umol/L — ABNORMAL HIGH (ref 9–35)

## 2021-07-29 MED ORDER — POTASSIUM CHLORIDE 10 MEQ/100ML IV SOLN
10.0000 meq | Freq: Once | INTRAVENOUS | Status: AC
  Administered 2021-07-29: 10 meq via INTRAVENOUS
  Filled 2021-07-29: qty 100

## 2021-07-29 MED ORDER — LEVETIRACETAM 500 MG PO TABS
1000.0000 mg | ORAL_TABLET | Freq: Two times a day (BID) | ORAL | Status: DC
  Administered 2021-07-29 – 2021-08-10 (×24): 1000 mg via ORAL
  Filled 2021-07-29 (×24): qty 2

## 2021-07-29 MED ORDER — POTASSIUM CHLORIDE CRYS ER 20 MEQ PO TBCR
40.0000 meq | EXTENDED_RELEASE_TABLET | ORAL | Status: AC
  Administered 2021-07-29 – 2021-07-30 (×4): 40 meq via ORAL
  Filled 2021-07-29 (×4): qty 2

## 2021-07-29 MED ORDER — PANTOPRAZOLE SODIUM 40 MG PO TBEC: 40.0000 mg | DELAYED_RELEASE_TABLET | Freq: Every day | ORAL | Status: DC

## 2021-07-29 MED ORDER — FOLIC ACID 1 MG PO TABS
1.0000 mg | ORAL_TABLET | Freq: Every day | ORAL | Status: DC
  Administered 2021-07-30 – 2021-08-10 (×12): 1 mg via ORAL
  Filled 2021-07-29 (×12): qty 1

## 2021-07-29 MED ORDER — POTASSIUM CHLORIDE 10 MEQ/100ML IV SOLN
10.0000 meq | Freq: Once | INTRAVENOUS | Status: AC
  Administered 2021-07-29: 10 meq via INTRAVENOUS

## 2021-07-29 MED ORDER — SODIUM CHLORIDE 0.9 % IV SOLN: INTRAVENOUS | Status: DC

## 2021-07-29 MED ORDER — ATORVASTATIN CALCIUM 80 MG PO TABS
80.0000 mg | ORAL_TABLET | Freq: Every day | ORAL | Status: DC
  Administered 2021-07-29 – 2021-08-09 (×11): 80 mg via ORAL
  Filled 2021-07-29 (×7): qty 1
  Filled 2021-07-29: qty 2
  Filled 2021-07-29: qty 1
  Filled 2021-07-29: qty 2
  Filled 2021-07-29 (×2): qty 1

## 2021-07-29 MED ORDER — ADULT MULTIVITAMIN W/MINERALS CH
1.0000 | ORAL_TABLET | Freq: Every day | ORAL | Status: DC
  Administered 2021-07-30 – 2021-08-10 (×12): 1 via ORAL
  Filled 2021-07-29 (×12): qty 1

## 2021-07-29 MED ORDER — MIRTAZAPINE 30 MG PO TABS
30.0000 mg | ORAL_TABLET | Freq: Every day | ORAL | Status: DC
  Administered 2021-07-30: 30 mg via ORAL
  Filled 2021-07-29: qty 1

## 2021-07-29 MED ORDER — MAGNESIUM SULFATE 2 GM/50ML IV SOLN
2.0000 g | INTRAVENOUS | Status: AC
  Administered 2021-07-29: 2 g via INTRAVENOUS
  Filled 2021-07-29: qty 50

## 2021-07-29 MED ORDER — POTASSIUM CHLORIDE IN NACL 20-0.9 MEQ/L-% IV SOLN
INTRAVENOUS | Status: DC
  Filled 2021-07-29 (×2): qty 1000

## 2021-07-29 MED ORDER — ASPIRIN EC 81 MG PO TBEC
81.0000 mg | DELAYED_RELEASE_TABLET | Freq: Every day | ORAL | Status: DC
  Administered 2021-07-29 – 2021-08-09 (×12): 81 mg via ORAL
  Filled 2021-07-29 (×12): qty 1

## 2021-07-29 MED ORDER — POTASSIUM CHLORIDE 10 MEQ/100ML IV SOLN
10.0000 meq | Freq: Once | INTRAVENOUS | Status: DC
  Filled 2021-07-29: qty 100

## 2021-07-29 MED ORDER — ENOXAPARIN SODIUM 40 MG/0.4ML IJ SOSY
40.0000 mg | PREFILLED_SYRINGE | Freq: Every day | INTRAMUSCULAR | Status: DC
  Administered 2021-07-30 – 2021-08-10 (×12): 40 mg via SUBCUTANEOUS
  Filled 2021-07-29 (×12): qty 0.4

## 2021-07-29 MED ORDER — THIAMINE HCL 100 MG PO TABS
100.0000 mg | ORAL_TABLET | Freq: Every day | ORAL | Status: DC
  Administered 2021-07-30 – 2021-08-10 (×12): 100 mg via ORAL
  Filled 2021-07-29 (×12): qty 1

## 2021-07-29 MED ORDER — POTASSIUM CHLORIDE IN NACL 20-0.9 MEQ/L-% IV SOLN: INTRAVENOUS | Status: DC

## 2021-07-29 MED ORDER — DEXTROSE 10 % IV SOLN: INTRAVENOUS | Status: DC

## 2021-07-29 MED ORDER — MAGNESIUM SULFATE 4 GM/100ML IV SOLN
4.0000 g | Freq: Once | INTRAVENOUS | Status: AC
  Administered 2021-07-29: 4 g via INTRAVENOUS
  Filled 2021-07-29: qty 100

## 2021-07-29 NOTE — ED Provider Notes (Signed)
[facility] EMERGENCY DEPARTMENT Provider Note   CSN: [id number] Arrival date & time: 07/29/21  1555     History Chief Complaint  Patient presents with   Weakness    Jeffrey [name] is a 55 y.o. male.   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  This patient also has a history of seizure-like activity, type 2 diabetes, he also takes nightly insulin.  He lives at home with family members.  Paramedics report that the patient was brought into the hospital today with generalized weakness after he had yet another fall, the paramedics and fire department has been to his house twice a week for the last month to help him get up off the ground every time that he falls.  It is generally when he is try to get out of bed.  He continues to drink about a 40 ounce of beer a day, he used to drink much more but after his "cardiac arrest" he stopped drinking that much.  He now sips on a 40 for 8 hours.  The patient reports that today he felt like he was having some shaking activity while he was in the bed, he tried to get up and fell.  The paramedics found his blood sugar to be at 42 he was able to take p.o. food and got it up to 100 but then dropped down to 70 by the time he arrived here.  The patient denies dysuria diarrhea or rectal bleeding or swelling of the legs now that he is on the diuretic.  He has no headache, no sore throat, he is not coughing or short of breath.  The paramedics note that his vital signs were otherwise unremarkable.  Past Medical History:  Diagnosis Date   Acute renal insufficiency 12/08/2012   AKI (acute kidney injury) ([place])    ALCOHOL ABUSE, HX OF 11/10/2007   Anoxic encephalopathy ([place])    Anxiety    Bleeding ulcer 2014   Blood transfusion 2014   "related to bleeding ulcer"   Cardiac arrest ([place]) 12/07/2012   Anoxic encephalopathy   GERD (gastroesophageal reflux  disease)    High cholesterol    Hypertension    OSA on CPAP 12/07/2012   Seizure-like activity ([place])    Seizures ([place])    last seizure 01/05/2021   Type II diabetes mellitus ([place])    Venous insufficiency 12/13/2011    Patient Active Problem List   Diagnosis Date Noted   Grief counseling 01/04/2021   Incontinence 12/23/2020   Hospital discharge follow-up 11/24/2020   Syncope 11/17/2020   Hypomagnesemia 11/05/2020   Hematemesis with nausea    Alcohol abuse with intoxication ([place])    Duodenal ulcer hemorrhagic    GI bleed 10/31/2020   Hyperglycemia 10/31/2020   DKA (diabetic ketoacidosis) ([place]) 07/22/2020   Candidiasis 07/22/2020   Hyponatremia 06/28/2020   Eyebrow laceration, left, initial encounter    Hypokalemia    Hyperlipidemia associated with type 2 diabetes mellitus (HCC)    MGUS (monoclonal gammopathy of unknown significance) 05/31/2020   Seizure disorder ([place]) 03/16/2020   Type 2 diabetes mellitus with hyperglycemia, with long-term current use of insulin ([place]) 03/16/2020   Tremor 03/16/2020   Colon cancer screening 02/22/2020   Stage 3a chronic kidney disease ([place]) 01/29/2020   Hyperphosphatemia 01/29/2020   Major depressive disorder, recurrent episode ([place]) 01/13/2018  Weakness    Hypotension 07/27/2017   Seizure ([place]) 04/08/2017   Proteinuria 11/23/2015   Essential hypertension    Cirrhosis ([place]) 03/22/2015   Ataxia 03/20/2015   Elevated liver enzymes    Dysarthria 03/09/2015   AKI (acute kidney injury) ([place])    Acute blood loss anemia 11/17/2014   ETOH abuse    Hyperkalemia    Tachycardia    Melena 10/10/2014   Erosive esophagitis 10/10/2014   Somnolence 10/08/2014   Acute kidney injury ([place])    OSA on CPAP 12/28/2012   Cardiac arrest ([place]) 12/07/2012   Anoxic brain injury ([place]) 12/07/2012   ERECTILE DYSFUNCTION 04/14/2007   Type 2 diabetes mellitus with diabetic neuropathy, with long-term current use of insulin ([place]) 12/04/2006   HYPERCHOLESTEROLEMIA  12/04/2006    Past Surgical History:  Procedure Laterality Date   BIOPSY  11/01/2020   Procedure: BIOPSY;  Surgeon: Irene Shipper, MD;  Location: [facility] ENDOSCOPY;  Service: Endoscopy;;   CARDIOVASCULAR STRESS TEST  03/16/13   Very Poor Exercise Tolerance; NON DIAGNOSTIC TEST   CATARACT EXTRACTION W/ INTRAOCULAR LENS IMPLANT Left 03/07/2014   [place] @ [facility]   ESOPHAGOGASTRODUODENOSCOPY Left 11/26/2012   Procedure: ESOPHAGOGASTRODUODENOSCOPY (EGD);  Surgeon: Wonda Horner, MD;  Location: [facility] ENDOSCOPY;  Service: Endoscopy;  Laterality: Left;   ESOPHAGOGASTRODUODENOSCOPY N/A 10/10/2014   Procedure: ESOPHAGOGASTRODUODENOSCOPY (EGD);  Surgeon: Ladene Artist, MD;  Location: [facility] ENDOSCOPY;  Service: Endoscopy;  Laterality: N/A;   ESOPHAGOGASTRODUODENOSCOPY (EGD) WITH PROPOFOL N/A 11/01/2020   Procedure: ESOPHAGOGASTRODUODENOSCOPY (EGD) WITH PROPOFOL;  Surgeon: Irene Shipper, MD;  Location: [facility] ENDOSCOPY;  Service: Endoscopy;  Laterality: N/A;   KNEE ARTHROSCOPY Left ~ 1999   UPPER GASTROINTESTINAL ENDOSCOPY         Family History  Problem Relation Age of Onset   Other Mother        Unsure of medical history.   Diabetes Mellitus II Father    Colon polyps Neg Hx    Colon cancer Neg Hx    Esophageal cancer Neg Hx    Rectal cancer Neg Hx    Stomach cancer Neg Hx     Social History   Tobacco Use   Smoking status: Never   Smokeless tobacco: Current    Types: Snuff  Vaping Use   Vaping Use: Never used  Substance Use Topics   Alcohol use: Yes    Comment: 12 pack beer / week   Drug use: No    Home Medications Prior to Admission medications   Medication Sig Start Date End Date Taking? Authorizing Provider  aspirin EC 81 MG tablet Take 1 tablet (81 mg total) by mouth at bedtime. 06/24/19   Zenia Resides, MD  Continuous Blood Gluc Sensor (DEXCOM G6 SENSOR) MISC 1 Units by Does not apply route as directed. 09/26/20   [name], MD  Continuous Blood Gluc Transmit  (DEXCOM G6 TRANSMITTER) MISC Use every 90 days 10/05/20   [name], MD  folic acid (FOLVITE) 1 MG tablet Take 1 tablet (1 mg total) by mouth daily. Patient taking differently: Take 1 mg by mouth at bedtime. 07/04/20   [name], MD  furosemide (LASIX) 40 MG tablet Take 1 tablet by mouth once daily 06/26/21   [name], MD  gabapentin (NEURONTIN) 300 MG capsule Take 3 capsules (900 mg total) by mouth 3 (three) times daily. 02/02/21   [name], MD  glucose blood (TRUE METRIX BLOOD GLUCOSE TEST) test strip Please use to check blood  sugar up to three times daily.  07/03/20   [name], MD  Insulin Glargine [facility]) 100 UNIT/ML Inject 6 Units into the skin daily. 02/27/21   [name], MD  levETIRAcetam (KEPPRA) 1000 MG tablet Take 1 tablet by mouth twice daily 03/06/21   [name], MD  lidocaine-prilocaine (EMLA) cream Apply 1 application topically as needed (pain).    [provider]  metFORMIN (GLUCOPHAGE-XR) 500 MG 24 hr tablet TAKE 1 TABLET BY MOUTH TWICE DAILY WITH A MEAL 07/26/21   [name], MD  mirtazapine (REMERON) 30 MG tablet TAKE 1 TABLET BY MOUTH AT BEDTIME 07/13/21   [name], MD  naltrexone (DEPADE) 50 MG tablet Take 1 tablet (50 mg total) by mouth daily. 01/17/21   Lind Covert, MD  pantoprazole (PROTONIX) 40 MG tablet Take 1 tablet (40 mg total) by mouth daily. 01/17/21 01/17/22  Lind Covert, MD  potassium chloride SA (KLOR-CON) 20 MEQ tablet Take 1 tablet by mouth once daily 03/02/21   [name], MD  sildenafil (VIAGRA) 100 MG tablet TAKE 1 TABLET BY MOUTH ONCE DAILY AS NEEDED FOR ERECTILE DYSFUNCTION 06/06/21   [name], MD  thiamine (VITAMIN B-1) 100 MG tablet Take 1 tablet (100 mg total) by mouth daily. 05/27/16   [name], DO    Allergies    Lisinopril and Drug ingredient [spironolactone]  Review of Systems   Review of Systems  Neurological:  Positive for  weakness.  All other systems reviewed and are negative.  Physical Exam Updated Vital Signs BP 131/89   Pulse 90   Resp 19   SpO2 100%   Physical Exam Vitals and nursing note reviewed.  Constitutional:      General: He is not in acute distress.    Appearance: He is well-developed.  HENT:     Head: Normocephalic and atraumatic.     Mouth/Throat:     Pharynx: No oropharyngeal exudate.  Eyes:     General: No scleral icterus.       Right eye: No discharge.        Left eye: No discharge.     Conjunctiva/sclera: Conjunctivae normal.     Pupils: Pupils are equal, round, and reactive to light.  Neck:     Thyroid: No thyromegaly.     Vascular: No JVD.  Cardiovascular:     Rate and Rhythm: Normal rate and regular rhythm.     Heart sounds: Normal heart sounds. No murmur heard.   No friction rub. No gallop.  Pulmonary:     Effort: Pulmonary effort is normal. No respiratory distress.     Breath sounds: Normal breath sounds. No wheezing or rales.  Abdominal:     General: Bowel sounds are normal. There is no distension.     Palpations: Abdomen is soft. There is no mass.     Tenderness: There is no abdominal tenderness.  Musculoskeletal:        General: No tenderness. Normal range of motion.     Cervical back: Normal range of motion and neck supple.  Lymphadenopathy:     Cervical: No cervical adenopathy.  Skin:    General: Skin is warm and dry.     Findings: No erythema or rash.  Neurological:     Mental Status: He is alert.     Coordination: Coordination normal.     Comments: The patient's speech is slowed but he is able to accurately answer my questions.  He has generalized weakness of his  legs barely able to lift either leg off the bed but able to use both of his arms to help set up in the bed.  His cranial nerves III through XII are otherwise normal  Psychiatric:        Behavior: Behavior normal.    ED Results / Procedures / Treatments   Labs (all labs ordered are listed,  but only abnormal results are displayed) Labs Reviewed  CBC WITH DIFFERENTIAL/PLATELET - Abnormal; Notable for the following components:      Result Value   RBC 3.89 (*)    Hemoglobin 12.1 (*)    HCT 33.0 (*)    MCHC 36.7 (*)    RDW 17.3 (*)    All other components within normal limits  COMPREHENSIVE METABOLIC PANEL - Abnormal; Notable for the following components:   Sodium 129 (*)    Potassium <2.0 (*)    Chloride 85 (*)    BUN 5 (*)    Creatinine, Ser 1.41 (*)    Calcium 5.8 (*)    Total Protein 6.0 (*)    Albumin <1.5 (*)    AST 83 (*)    ALT 48 (*)    Alkaline Phosphatase 195 (*)    Total Bilirubin 1.6 (*)    GFR, Estimated 58 (*)    Anion gap 19 (*)    All other components within normal limits  MAGNESIUM - Abnormal; Notable for the following components:   Magnesium 0.7 (*)    All other components within normal limits  AMMONIA - Abnormal; Notable for the following components:   Ammonia 61 (*)    All other components within normal limits  ETHANOL - Abnormal; Notable for the following components:   Alcohol, Ethyl (B) 42 (*)    All other components within normal limits  CBG MONITORING, ED - Abnormal; Notable for the following components:   Glucose-Capillary 157 (*)    All other components within normal limits  TROPONIN I (HIGH SENSITIVITY) - Abnormal; Notable for the following components:   Troponin I (High Sensitivity) 23 (*)    All other components within normal limits  RESP PANEL BY RT-PCR (FLU A&B, COVID) ARPGX2  URINALYSIS, ROUTINE W REFLEX MICROSCOPIC  RAPID URINE DRUG SCREEN, HOSP PERFORMED    EKG EKG Interpretation  Date/Time:  Sunday July 29 2021 17:48:26 EDT Ventricular Rate:  88 PR Interval:  158 QRS Duration: 100 QT Interval:  486 QTC Calculation: 588 R Axis:   -23 Text Interpretation: Sinus rhythm with frequent and consecutive Premature ventricular complexes Nonspecific T wave abnormality Abnormal ECG Confirmed by Noemi Chapel [phone]) on  07/29/2021 6:00:41 PM  Radiology No results found.  Procedures .Critical Care Performed by: Noemi Chapel, MD Authorized by: Noemi Chapel, MD   Critical care provider statement:    Critical care time (minutes):  45   Critical care time was exclusive of:  Separately billable procedures and treating other patients   Critical care was necessary to treat or prevent imminent or life-threatening deterioration of the following conditions:  Endocrine crisis   Critical care was time spent personally by me on the following activities:  Development of treatment plan with patient or surrogate, discussions with consultants, evaluation of patient's response to treatment, examination of patient, obtaining history from patient or surrogate, review of old charts, re-evaluation of patient's condition, pulse oximetry, ordering and review of radiographic studies, ordering and review of laboratory studies and ordering and performing treatments and interventions   Care discussed with: admitting provider  Medications Ordered in ED Medications  dextrose 10 % infusion ( Intravenous New Bag/Given 07/29/21 1643)  magnesium sulfate IVPB 2 g 50 mL (has no administration in time range)  potassium chloride 10 mEq in 100 mL IVPB (has no administration in time range)  potassium chloride 10 mEq in 100 mL IVPB (has no administration in time range)    ED Course  I have reviewed the triage vital signs and the nursing notes.  Pertinent labs & imaging results that were available during my care of the patient were reviewed by me and considered in my medical decision making (see chart for details).    MDM Rules/Calculators/A&P                           The patient has generalized weakness, this may impart be due to hypoglycemia but would also consider electrolyte abnormality such as hyponatremia, he may have a worsening kidney injury, he this may be part of his chronic liver failure from his alcohol abuse, he  certainly is in a debilitated state and though he is going to rehab twice a week he has been falling frequently and likely need of a more broad work-up.  Will initiate at this time including laboratory work-up, IV fluids, he likely needs a D10 drip for the time being urinalysis infection work-up and cardiac monitoring.  The patient is agreeable to this plan.  Patient has some critical levels including severe hypokalemia with an on measurably low potassium, magnesium was less than 1 also critically low, calcium was critically low and the glucose was low requiring a D10 drip.  Critical care provided, patient will need to be admitted to the hospital and is currently receiving infusions of magnesium and potassium and ongoing dextrose.  The patient's primary care physician is with the [name].  Paged at 7:40 PM  Final Clinical Impression(s) / ED Diagnoses Final diagnoses:  Hypokalemia  Hypomagnesemia  Hypoalbuminemia  Hypoglycemia     Noemi Chapel, MD 07/29/21 1945

## 2021-07-29 NOTE — ED Triage Notes (Signed)
Pt here via EMS from home with c/o of multiple falls X2 months. Non compliant with diabetic medication. CBG 42. Given PB sandwich with increase to 102.  History of ETOH abuse.   138/108 HR 94 100% RA 96.5 T

## 2021-07-29 NOTE — ED Notes (Signed)
Pt now alert and able to answer all questions appropriately. And does not remember what just what occured

## 2021-07-29 NOTE — ED Notes (Signed)
Received verbal report from Tara D RN at this time 

## 2021-07-29 NOTE — Hospital Course (Addendum)
  Jeffrey [name] is a 55 y.o. male who presented with multiple falls at home and severe electrolyte derangements. Hospital course briefly complicated by altered mental status. PMH significant for alcohol use disorder, seizure disorder, hx of PEA arrest and anoxic brain injury, DM2, HTN and HFpEF.   Nonfulminant C Diff Infection  Severe Electrolyte Derangements Patient found to be C. Diff positive on admission. He was treated with a 10 day course of Fidaxomicin (10/24-11/2).  After completion of this regimen, patient endorsed 5 loose stools.  ID pharmacist consulted, who advised of the possibility of post-fidaxomicin diarrhea.   Altered Mental Status Patient developed altered mental status and some increased somnolence on hospital day #4. Workup included negative CT head and normal EEG on admission, negative UDS, and electrolytes were within normal limits by this point in his admission. Neuro consulted Ultimately thought to be alcohol withdrawal with a component of gabapentin withdrawal as well.  Urinary Retention Patient had an initial bladder scan that showed 155 mL retained, and a subsequent with 248 mL retained.  Denied any urinary symptoms, so no further intervention.  T2DM CBG in mid 100s to low 300s throughout stay with Semglee adjusted to 5 units, and SSI given with meals and at bedtime correction.  Home Wilder Glade was continued and patient given Ensure supplement.  Follow up issues for PCP - 4 weeks after dc with neurology  - OP colonoscopy recommended for anemia

## 2021-07-29 NOTE — ED Notes (Signed)
Pt moved to room per providers request

## 2021-07-29 NOTE — ED Notes (Signed)
Pt returned from radiology and is asking to be placed on bedpan

## 2021-07-29 NOTE — H&P (Addendum)
[facility] Admission History and Physical Service Pager: [phone]  Patient name: Jeffrey [name] Medical record number: [id number] Date of birth: 1966/02/07 Age: 55 y.o. Gender: male  Primary Care Provider: [name], MD Consultants: none Code Status: FULL Preferred Emergency Contact: Ulis Rias, [phone]  Chief Complaint: fall  Assessment and Plan: Jeffrey [name] is a 55 y.o. male presenting with falls. PMH is significant for alcohol use disorder, cirrhosis, erosive esophagitis, PEA cardiac arrest in 2014 requiring prolonged intubation, seizure disorder, ESBL Ucx.   Recurrent Falls  Patient with a longstanding Hx of alcohol abuse presented to the ED after a fall. Per EMS report they have been to the patient's house frequently over the past several days for repeated falls. Per patient's report he has been unable to walk for the past week due to developing "shakes" when he stands up. He reports being admitted in February of 2022 for a similar complaint and neurology felt as if sx were "suspected convulsive syncope".  Also of note, patient had a witnessed syncopal event in the ED. He stood up with a tech to use the restroom, staff caught him on his way down and was unresponsive for 2-3 minutes. No tonic-clonic movements noted but ED RN notes pt's eyes rolled to the back of his head and was incontinent of stool. Upon regaining consciousness he was fully alert and oriented. Pt notes he had diarrhea over the past week. Vital signs stable on admission. ED labs notable for severe hypokalemia and hypomagnesemia (see below), EtOH of 42, ammonia of 61, AST/ALT of 83/48, troponin of 23. CT head was unremarkable for acute process. ECHO in Feb 2022 showed EF 70-75% and mild ascending aortic dilation. Falls possibly electrolyte abnormalities, orthostatic, cardiogenic or neurologic in nature. Though pt has been working with PT regularly notes that at times he is  unable to get out of bed.   -Admit to med-tele, Attending Dr. Owens Shark.  -F/u UDS, HIV Ab, ammonia, repeat trop -Orthostatic vitals -PT/OT: eval and treat  -seizure precautions  -Consider EEG  -Follow up Keppra level -Obtain ECHO - Lovenox for VTE ppx  - Carb-modified diet   Acute Kidney Injury Baseline Cr of 0.9. Current Cr of 1.4. Likely pre-renal in the setting of decreased po intake and diarrhea. Follows infrequently with [employer].  -Encourage PO intake -mIVF at 150 mL/hr +25mEq KCl  -Monitor with daily BMP -Avoid for nephrotic agents   Hypokalemia, hypomagnesemia, hypocalcemia  K<2.0 and mag of 0.7 on admission, EKG with artifact. Patient given 2 g of mag sulfate and 2 runs of 10 meq IV Kcl in the ED.  Abnormalities likely 2/2 poor intake. Corrected at best is <7.8. Repeat EKG with prolonged QT at 560 but no U-waves or ST depressions noted.  -mIVF with 20 meq KCl  -40 meq PO Kcl q2hrs for 4 doses -Additioanl Mag sulfate, 4 g IV -Tums 400 mg BID -Recheck BMP at 1 AM -Repeat EKG tomorrow morning  Diarrhea  Reports diarrhea for the past week. Pt afebrile without leukocytosis, WBC 8.3.  Of note, at pt's last admission he reported ~8 stool/day. - Obtain C Diff testing  - monitor electrolytes  - continue IV fluids   Prolonged QTc Qtc of 560 on admission.  -Avoid QT prolonging medications (e.g antiemetics) -Repeat EKG tomorrow morning   Protein Calorie Malnutrition  Albumin undetectable on admission.  Regarding his poor PO intake, GI was consulted during the last admission and an EGD showed severe esophagitis,  duodenitis and CTA ABD showed chronic pancreatitis.  - multivitamin  - Boost TID between meals  - consider dietician consult  - continue home PPI - AM CMP  Alcohol use disorder Longstanding Hx. Per previous two D/C summaries, patient has not had any notable withdrawal symptoms and has not required any PRN Ativan over those two admissions. Patient  currently reports no interest in quitting or reducing his intake. Drinks 40 oz beer daily.  Last drank yesterday.  -CIWA q8 hrs, no PRN Ativan  -Thiamine PO, folic acid, MV   Seizure disorder Patient's primary neurologist is Dr. [name], though he appears to have not seen this physician since June 2021. He apparently had been well controlled on Depakote but was switched to Keppra at some point between his two previous hospitalizations in 2022 (poor documentation in the EMR). He admits to non-compliance with his Keppra and does not appear to have followed up with Neuro.  -Discuss reasons for non-compliance -Consider Keppra level -Start home Keppra 1,000 mg BID -Seizure precautions -Ensure outpatient neuro follow up  Cirrhosis Reported in previous D/C summaries. Diffuse hepatic steatosis noted on CT ABD/P in Jan of 2022.  Ammonia level 61. AST/Alt 83/48 with Tbili 1.6.  Given concurrent diarrhea will hold on lactulose. No stigmata of liver disease on exam.   Hypoglycemia in the setting of T2DM  ED RN notes pt's CBG was 42. ED provider started D10 infusion. Glucose on admission 162. Patient with noted past episodes of hypoglycemia. Home medication of Lantus 12U daily. A1C of 5.7 in September of 2022. Will hold for now and monitor CBGs.  -CBGs QID -Restart long acting insulin when able  -Discontinue D10  Hyperlipidemia  Hx of cardiac arrest Patient home medication is Lipitor 80 mg and low dose aspirin dialy. Hx of cardiac arrest with anoxic encephalopathy.  - Continue home Lipitor and ASA  Depression  Takes Remeron at bedtime  - Continue home medication   OSA -offer CPAP at bedtime  FEN/GI: carb modified diet Prophylaxis: Lovenox   Disposition: med-tele   History of Present Illness:  Jeffrey [name] is a 55 y.o. male presenting with recurrent falls. He reports that when he stands up to walk he begins shaking and falls down and then "I'm out, like I'm having seizures." He has had  episodes similar to this in the past but they have increased in frequency since last Saturday to the point that he cna no longer get out of bed. He seldomly eats, states he has bad appetite.   EMS reports glucose in the 40s. In the ED, given 2 runs of IV potassium and 2g Mg for severe hypokalemia and hypomagnesemia. Pt started on D10 infusion.      Review Of Systems: Per HPI with the following additions:    Review of Systems  Constitutional:  Negative for fever.  Respiratory:  Negative for shortness of breath.   Cardiovascular:  Negative for chest pain.  Gastrointestinal:  Positive for diarrhea. Negative for abdominal pain, nausea and vomiting.  Genitourinary:  Negative for dysuria, frequency and urgency.  Musculoskeletal:  Positive for falls.  Neurological:  Negative for headaches.  Eyes: no new vision changes   Patient Active Problem List   Diagnosis Date Noted   Recurrent falls 07/29/2021   Grief counseling 01/04/2021   Incontinence 12/23/2020   Hospital discharge follow-up 11/24/2020   Syncope 11/17/2020   Hypomagnesemia 11/05/2020   Hematemesis with nausea    Alcohol abuse with intoxication ([place])    Duodenal  ulcer hemorrhagic    GI bleed 10/31/2020   Hyperglycemia 10/31/2020   DKA (diabetic ketoacidosis) ([place]) 07/22/2020   Candidiasis 07/22/2020   Hyponatremia 06/28/2020   Eyebrow laceration, left, initial encounter    Hypokalemia    Hyperlipidemia associated with type 2 diabetes mellitus ([place])    MGUS (monoclonal gammopathy of unknown significance) 05/31/2020   Seizure disorder ([place]) 03/16/2020   Type 2 diabetes mellitus with hyperglycemia, with long-term current use of insulin ([place]) 03/16/2020   Tremor 03/16/2020   Colon cancer screening 02/22/2020   Stage 3a chronic kidney disease ([place]) 01/29/2020   Hyperphosphatemia 01/29/2020   Major depressive disorder, recurrent episode ([place]) 01/13/2018   Weakness    Hypotension 07/27/2017   Seizure ([place]) 04/08/2017    Proteinuria 11/23/2015   Essential hypertension    Cirrhosis ([place]) 03/22/2015   Ataxia 03/20/2015   Elevated liver enzymes    Dysarthria 03/09/2015   AKI (acute kidney injury) ([place])    Acute blood loss anemia 11/17/2014   ETOH abuse    Hyperkalemia    Tachycardia    Melena 10/10/2014   Erosive esophagitis 10/10/2014   Somnolence 10/08/2014   Acute kidney injury ([place])    OSA on CPAP 12/28/2012   Cardiac arrest ([place]) 12/07/2012   Anoxic brain injury ([place]) 12/07/2012   ERECTILE DYSFUNCTION 04/14/2007   Type 2 diabetes mellitus with diabetic neuropathy, with long-term current use of insulin ([place]) 12/04/2006   HYPERCHOLESTEROLEMIA 12/04/2006    Past Medical History: Past Medical History:  Diagnosis Date   Acute renal insufficiency 12/08/2012   AKI (acute kidney injury) ([place])    ALCOHOL ABUSE, HX OF 11/10/2007   Anoxic encephalopathy ([facility])    Anxiety    Bleeding ulcer 2014   Blood transfusion 2014   "related to bleeding ulcer"   Cardiac arrest ([place]) 12/07/2012   Anoxic encephalopathy   GERD (gastroesophageal reflux disease)    High cholesterol    Hypertension    OSA on CPAP 12/07/2012   Seizure-like activity ([place])    Seizures ([place])    last seizure 01/05/2021   Type II diabetes mellitus ([place])    Venous insufficiency 12/13/2011    Past Surgical History: Past Surgical History:  Procedure Laterality Date   BIOPSY  11/01/2020   Procedure: BIOPSY;  Surgeon: Irene Shipper, MD;  Location: [facility] ENDOSCOPY;  Service: Endoscopy;;   CARDIOVASCULAR STRESS TEST  03/16/13   Very Poor Exercise Tolerance; NON DIAGNOSTIC TEST   CATARACT EXTRACTION W/ INTRAOCULAR LENS IMPLANT Left 03/07/2014   [place] @ [facility]   ESOPHAGOGASTRODUODENOSCOPY Left 11/26/2012   Procedure: ESOPHAGOGASTRODUODENOSCOPY (EGD);  Surgeon: Wonda Horner, MD;  Location: [facility] ENDOSCOPY;  Service: Endoscopy;  Laterality: Left;   ESOPHAGOGASTRODUODENOSCOPY N/A 10/10/2014   Procedure: ESOPHAGOGASTRODUODENOSCOPY (EGD);   Surgeon: Ladene Artist, MD;  Location: [facility] ENDOSCOPY;  Service: Endoscopy;  Laterality: N/A;   ESOPHAGOGASTRODUODENOSCOPY (EGD) WITH PROPOFOL N/A 11/01/2020   Procedure: ESOPHAGOGASTRODUODENOSCOPY (EGD) WITH PROPOFOL;  Surgeon: Irene Shipper, MD;  Location: [facility] ENDOSCOPY;  Service: Endoscopy;  Laterality: N/A;   KNEE ARTHROSCOPY Left ~ 1999   UPPER GASTROINTESTINAL ENDOSCOPY      Social History: Social History   Tobacco Use   Smoking status: Never   Smokeless tobacco: Current    Types: Snuff  Vaping Use   Vaping Use: Never used  Substance Use Topics   Alcohol use: Yes    Comment: 12 pack beer / week   Drug use: No   Additional social history: reports drinking  1 40oz of beer per day, denies smoking and illegal drugs.  Please also refer to relevant sections of EMR.  Family History: Family History  Problem Relation Age of Onset   Other Mother        Unsure of medical history.   Diabetes Mellitus II Father    Colon polyps Neg Hx    Colon cancer Neg Hx    Esophageal cancer Neg Hx    Rectal cancer Neg Hx    Stomach cancer Neg Hx     Allergies and Medications: Allergies  Allergen Reactions   Lisinopril Other (See Comments)    Pt reports nose bleed.   Atorvastatin     Cause Muscle Weakness   Drug Ingredient [Spironolactone]     Hyperkalemia   No current facility-administered medications on file prior to encounter.   Current Outpatient Medications on File Prior to Encounter  Medication Sig Dispense Refill   aspirin EC 81 MG tablet Take 1 tablet (81 mg total) by mouth at bedtime. 90 tablet 3   folic acid (FOLVITE) 1 MG tablet Take 1 tablet (1 mg total) by mouth daily. (Patient taking differently: Take 1 mg by mouth at bedtime.) 90 tablet 3   furosemide (LASIX) 40 MG tablet Take 1 tablet by mouth once daily (Patient taking differently: Take 40 mg by mouth daily.) 30 tablet 0   gabapentin (NEURONTIN) 300 MG capsule Take 3 capsules (900 mg total) by mouth 3 (three) times  daily. 270 capsule 3   insulin glargine (LANTUS) 100 unit/mL SOPN Inject 20 Units into the skin daily.     levETIRAcetam (KEPPRA) 1000 MG tablet Take 1 tablet by mouth twice daily (Patient taking differently: Take 1,000 mg by mouth 2 (two) times daily.) 60 tablet 0   metFORMIN (GLUCOPHAGE-XR) 500 MG 24 hr tablet TAKE 1 TABLET BY MOUTH TWICE DAILY WITH A MEAL (Patient taking differently: Take 500 mg by mouth 2 (two) times daily with a meal.) 60 tablet 0   mirtazapine (REMERON) 30 MG tablet TAKE 1 TABLET BY MOUTH AT BEDTIME (Patient taking differently: Take 30 mg by mouth at bedtime.) 30 tablet 0   pantoprazole (PROTONIX) 40 MG tablet Take 1 tablet (40 mg total) by mouth daily. 90 tablet 1   potassium chloride SA (KLOR-CON) 20 MEQ tablet Take 1 tablet by mouth once daily (Patient taking differently: Take 20 mEq by mouth daily.) 30 tablet 0   sildenafil (VIAGRA) 100 MG tablet TAKE 1 TABLET BY MOUTH ONCE DAILY AS NEEDED FOR ERECTILE DYSFUNCTION (Patient taking differently: Take 100 mg by mouth daily as needed for erectile dysfunction.) 10 tablet 0   thiamine (VITAMIN B-1) 100 MG tablet Take 1 tablet (100 mg total) by mouth daily. 30 tablet 12   atorvastatin (LIPITOR) 80 MG tablet Take 80 mg by mouth at bedtime.     Continuous Blood Gluc Sensor (DEXCOM G6 SENSOR) MISC 1 Units by Does not apply route as directed. 1 each 3   Continuous Blood Gluc Transmit (DEXCOM G6 TRANSMITTER) MISC Use every 90 days 1 each 3   dapagliflozin propanediol (FARXIGA) 10 MG TABS tablet Take 10 mg by mouth daily.     glucose blood (TRUE METRIX BLOOD GLUCOSE TEST) test strip Please use to check blood sugar up to three times daily.  100 each PRN   naltrexone (DEPADE) 50 MG tablet Take 1 tablet (50 mg total) by mouth daily. 30 tablet 3    Objective: BP (!) 106/93   Pulse 86   Resp (!)  23   Ht 5\' 10"  (1.778 m)   Wt 248 lb (112.5 kg)   SpO2 100%   BMI 35.58 kg/m  Physical Exam Vitals reviewed.  HENT:     Head:      Comments: Small laceration on the right frontal area, otherwise NCAT Eyes:     Conjunctiva/sclera: Conjunctivae normal.     Comments: Mild scleral icterus  Cardiovascular:     Rate and Rhythm: Normal rate and regular rhythm.     Heart sounds: No murmur heard. Pulmonary:     Effort: Pulmonary effort is normal.     Breath sounds: Normal breath sounds.  Abdominal:     General: Bowel sounds are normal. There is no distension.     Palpations: Abdomen is soft.     Comments: No fluid wave  Neurological:     Mental Status: He is alert.     Labs and Imaging: CBC BMET  Recent Labs  Lab 07/29/21 1610  WBC 8.3  HGB 12.1*  HCT 33.0*  PLT 204   Recent Labs  Lab 07/29/21 1610  NA 129*  K <2.0*  CL 85*  CO2 25  BUN 5*  CREATININE 1.41*  GLUCOSE 70  CALCIUM 5.8*     EKG: NSR, prolonged QT, no u-waves or ST depressions    Corky Sox, MD PGY-1  FPTS Upper-Level Resident Addendum   I have independently interviewed and examined the patient. I have discussed the above with the original author and agree with their documentation. My edits for correction/addition/clarification are in within the document and see PE below. Please see also any attending notes.   GEN:     alert, appears older than stated age and no distress    HENT:  mucus membranes moist, oropharyngeal without lesions or erythema,  nares patent, no nasal discharge  EYES:   pupils equal and reactive, no scleral icterus or injection  NECK:  supple, normal ROM, C-spine non-tender  RESP:  clear to auscultation bilaterally, no increased work of breathing  CVS:   regular rate and rhythm, distal pulses intact   ABD:  soft, non-tender; bowel sounds present; liver edge at costal margin EXT:   No LE edema  NEURO:  speech normal, alert and oriented x3, normal finger to nose, CN 2-12 grossly intact, gait and strength not accessed  Skin:   warm and dry, hyperpigmented patches on extremities, abrasion on 3rd toe, minimal  bleeding, right flank with linear long hyperpigmented extending from shoulder to lumbar region Psych: Normal affect, appropriate speech and behavior      Lyndee Hensen, DO PGY-3, [facility] 07/30/2021 2:36 AM  FPTS Service pager: [phone] (text pages welcome through [facility])

## 2021-07-29 NOTE — ED Notes (Signed)
Provider at bedside

## 2021-07-29 NOTE — ED Notes (Signed)
Pt stated he needed to have a bm but was able to ambulate with assistance. While attempting to ambulate pt pt went weak in the legs. Pt was assisted back into the bed. Andc had an episode what appeared like seizure like activity. Went unresponsive, eyes rolled into the back of his head, was incontinent on self, grunting with snoring respirations.

## 2021-07-29 NOTE — ED Notes (Signed)
Admit providers at bedside 

## 2021-07-30 ENCOUNTER — Inpatient Hospital Stay (HOSPITAL_COMMUNITY): Payer: 59

## 2021-07-30 ENCOUNTER — Telehealth (HOSPITAL_COMMUNITY): Payer: Self-pay | Admitting: Pharmacy Technician

## 2021-07-30 ENCOUNTER — Other Ambulatory Visit (HOSPITAL_COMMUNITY): Payer: Self-pay

## 2021-07-30 ENCOUNTER — Encounter (HOSPITAL_COMMUNITY): Payer: Self-pay | Admitting: Family Medicine

## 2021-07-30 DIAGNOSIS — E8809 Other disorders of plasma-protein metabolism, not elsewhere classified: Secondary | ICD-10-CM

## 2021-07-30 DIAGNOSIS — R404 Transient alteration of awareness: Secondary | ICD-10-CM | POA: Diagnosis not present

## 2021-07-30 DIAGNOSIS — R55 Syncope and collapse: Secondary | ICD-10-CM

## 2021-07-30 DIAGNOSIS — R531 Weakness: Secondary | ICD-10-CM

## 2021-07-30 DIAGNOSIS — E162 Hypoglycemia, unspecified: Secondary | ICD-10-CM

## 2021-07-30 DIAGNOSIS — E876 Hypokalemia: Secondary | ICD-10-CM | POA: Diagnosis not present

## 2021-07-30 LAB — URINALYSIS, ROUTINE W REFLEX MICROSCOPIC
Bacteria, UA: NONE SEEN
Bilirubin Urine: NEGATIVE
Glucose, UA: NEGATIVE mg/dL
Ketones, ur: NEGATIVE mg/dL
Leukocytes,Ua: NEGATIVE
Nitrite: NEGATIVE
Protein, ur: NEGATIVE mg/dL
Specific Gravity, Urine: 1.008 (ref 1.005–1.030)
pH: 5 (ref 5.0–8.0)

## 2021-07-30 LAB — C DIFFICILE QUICK SCREEN W PCR REFLEX
C Diff antigen: POSITIVE — AB
C Diff interpretation: DETECTED
C Diff toxin: POSITIVE — AB

## 2021-07-30 LAB — BASIC METABOLIC PANEL
Anion gap: 15 (ref 5–15)
Anion gap: 13 (ref 5–15)
Anion gap: 11 (ref 5–15)
Anion gap: 8 (ref 5–15)
BUN: 5 mg/dL — ABNORMAL LOW (ref 6–20)
BUN: 5 mg/dL — ABNORMAL LOW (ref 6–20)
BUN: 5 mg/dL — ABNORMAL LOW (ref 6–20)
BUN: 5 mg/dL — ABNORMAL LOW (ref 6–20)
CO2: 30 mmol/L (ref 22–32)
CO2: 27 mmol/L (ref 22–32)
CO2: 28 mmol/L (ref 22–32)
CO2: 18 mmol/L — ABNORMAL LOW (ref 22–32)
Calcium: 5.6 mg/dL — CL (ref 8.9–10.3)
Calcium: 5.6 mg/dL — CL (ref 8.9–10.3)
Calcium: 5.3 mg/dL — CL (ref 8.9–10.3)
Calcium: <4 mg/dL — CL (ref 8.9–10.3)
Chloride: 83 mmol/L — ABNORMAL LOW (ref 98–111)
Chloride: 86 mmol/L — ABNORMAL LOW (ref 98–111)
Chloride: 91 mmol/L — ABNORMAL LOW (ref 98–111)
Chloride: 108 mmol/L (ref 98–111)
Creatinine, Ser: 1.43 mg/dL — ABNORMAL HIGH (ref 0.61–1.24)
Creatinine, Ser: 1.25 mg/dL — ABNORMAL HIGH (ref 0.61–1.24)
Creatinine, Ser: 1.41 mg/dL — ABNORMAL HIGH (ref 0.61–1.24)
Creatinine, Ser: 1.04 mg/dL (ref 0.61–1.24)
GFR, Estimated: 57 mL/min — ABNORMAL LOW (ref >60)
GFR, Estimated: >60 mL/min (ref >60)
GFR, Estimated: 58 mL/min — ABNORMAL LOW (ref >60)
GFR, Estimated: >60 mL/min (ref >60)
Glucose, Bld: 180 mg/dL — ABNORMAL HIGH (ref 70–99)
Glucose, Bld: 168 mg/dL — ABNORMAL HIGH (ref 70–99)
Glucose, Bld: 244 mg/dL — ABNORMAL HIGH (ref 70–99)
Glucose, Bld: 259 mg/dL — ABNORMAL HIGH (ref 70–99)
Potassium: <2 mmol/L — CL (ref 3.5–5.1)
Potassium: 2.3 mmol/L — CL (ref 3.5–5.1)
Potassium: 2.6 mmol/L — CL (ref 3.5–5.1)
Potassium: 2.5 mmol/L — CL (ref 3.5–5.1)
Sodium: 128 mmol/L — ABNORMAL LOW (ref 135–145)
Sodium: 126 mmol/L — ABNORMAL LOW (ref 135–145)
Sodium: 130 mmol/L — ABNORMAL LOW (ref 135–145)
Sodium: 134 mmol/L — ABNORMAL LOW (ref 135–145)

## 2021-07-30 LAB — COMPREHENSIVE METABOLIC PANEL
ALT: 45 U/L — ABNORMAL HIGH (ref 0–44)
AST: 68 U/L — ABNORMAL HIGH (ref 15–41)
Albumin: <1.5 g/dL — ABNORMAL LOW (ref 3.5–5.0)
Alkaline Phosphatase: 185 U/L — ABNORMAL HIGH (ref 38–126)
Anion gap: 12 (ref 5–15)
BUN: 6 mg/dL (ref 6–20)
CO2: 29 mmol/L (ref 22–32)
Calcium: 5.6 mg/dL — CL (ref 8.9–10.3)
Chloride: 87 mmol/L — ABNORMAL LOW (ref 98–111)
Creatinine, Ser: 1.35 mg/dL — ABNORMAL HIGH (ref 0.61–1.24)
GFR, Estimated: >60 mL/min (ref >60)
Glucose, Bld: 135 mg/dL — ABNORMAL HIGH (ref 70–99)
Potassium: 2.9 mmol/L — ABNORMAL LOW (ref 3.5–5.1)
Sodium: 128 mmol/L — ABNORMAL LOW (ref 135–145)
Total Bilirubin: 1.7 mg/dL — ABNORMAL HIGH (ref 0.3–1.2)
Total Protein: 5.7 g/dL — ABNORMAL LOW (ref 6.5–8.1)

## 2021-07-30 LAB — RAPID URINE DRUG SCREEN, HOSP PERFORMED
Amphetamines: NOT DETECTED
Barbiturates: NOT DETECTED
Benzodiazepines: NOT DETECTED
Cocaine: NOT DETECTED
Opiates: NOT DETECTED
Tetrahydrocannabinol: NOT DETECTED

## 2021-07-30 LAB — CK: Total CK: 832 U/L — ABNORMAL HIGH (ref 49–397)

## 2021-07-30 LAB — ECHOCARDIOGRAM COMPLETE
Area-P 1/2: 7.16 cm2
Height: 70 in
S' Lateral: 2.2 cm
Weight: 3968 oz

## 2021-07-30 LAB — CBG MONITORING, ED
Glucose-Capillary: 272 mg/dL — ABNORMAL HIGH (ref 70–99)
Glucose-Capillary: 325 mg/dL — ABNORMAL HIGH (ref 70–99)
Glucose-Capillary: 401 mg/dL — ABNORMAL HIGH (ref 70–99)

## 2021-07-30 LAB — MAGNESIUM
Magnesium: 1.8 mg/dL (ref 1.7–2.4)
Magnesium: 1.8 mg/dL (ref 1.7–2.4)
Magnesium: 1.1 mg/dL — ABNORMAL LOW (ref 1.7–2.4)

## 2021-07-30 LAB — HIV ANTIBODY (ROUTINE TESTING W REFLEX): HIV Screen 4th Generation wRfx: NONREACTIVE

## 2021-07-30 LAB — TROPONIN I (HIGH SENSITIVITY): Troponin I (High Sensitivity): 26 ng/L — ABNORMAL HIGH (ref <18)

## 2021-07-30 LAB — AMMONIA: Ammonia: 49 umol/L — ABNORMAL HIGH (ref 9–35)

## 2021-07-30 LAB — PHOSPHORUS: Phosphorus: 3.4 mg/dL (ref 2.5–4.6)

## 2021-07-30 MED ORDER — POTASSIUM CHLORIDE 10 MEQ/100ML IV SOLN
10.0000 meq | INTRAVENOUS | Status: AC
Start: 2021-07-30 — End: 2021-07-30
  Administered 2021-07-30 (×4): 10 meq via INTRAVENOUS
  Filled 2021-07-30 (×4): qty 100

## 2021-07-30 MED ORDER — SODIUM CHLORIDE 0.9 % IV SOLN: INTRAVENOUS | Status: DC

## 2021-07-30 MED ORDER — BOOST / RESOURCE BREEZE PO LIQD CUSTOM
1.0000 | Freq: Three times a day (TID) | ORAL | Status: DC
  Administered 2021-07-30 – 2021-08-08 (×22): 1 via ORAL
  Filled 2021-07-30 (×3): qty 1

## 2021-07-30 MED ORDER — CALCIUM GLUCONATE-NACL 2-0.675 GM/100ML-% IV SOLN
2.0000 g | Freq: Once | INTRAVENOUS | Status: AC
  Administered 2021-07-30: 2000 mg via INTRAVENOUS
  Filled 2021-07-30: qty 100

## 2021-07-30 MED ORDER — INSULIN ASPART 100 UNIT/ML IJ SOLN: 0.0000 [IU] | INTRAMUSCULAR | Status: DC

## 2021-07-30 MED ORDER — FIDAXOMICIN 200 MG PO TABS: 200.0000 mg | ORAL_TABLET | Freq: Two times a day (BID) | ORAL | Status: DC

## 2021-07-30 MED ORDER — POTASSIUM CHLORIDE CRYS ER 20 MEQ PO TBCR
40.0000 meq | EXTENDED_RELEASE_TABLET | Freq: Once | ORAL | Status: AC
  Administered 2021-07-30: 40 meq via ORAL
  Filled 2021-07-30: qty 2

## 2021-07-30 MED ORDER — MAGNESIUM SULFATE 2 GM/50ML IV SOLN
2.0000 g | Freq: Every day | INTRAVENOUS | Status: DC
  Administered 2021-07-31 – 2021-08-01 (×2): 2 g via INTRAVENOUS
  Filled 2021-07-30 (×2): qty 50

## 2021-07-30 MED ORDER — POTASSIUM CHLORIDE CRYS ER 20 MEQ PO TBCR
60.0000 meq | EXTENDED_RELEASE_TABLET | ORAL | Status: AC
  Administered 2021-07-30 (×2): 60 meq via ORAL
  Filled 2021-07-30 (×2): qty 3

## 2021-07-30 MED ORDER — FIDAXOMICIN 200 MG PO TABS
200.0000 mg | ORAL_TABLET | Freq: Two times a day (BID) | ORAL | Status: AC
  Administered 2021-07-30 – 2021-08-08 (×20): 200 mg via ORAL
  Filled 2021-07-30 (×22): qty 1

## 2021-07-30 MED ORDER — POTASSIUM CHLORIDE IN NACL 40-0.9 MEQ/L-% IV SOLN
INTRAVENOUS | Status: DC
  Filled 2021-07-30 (×2): qty 1000

## 2021-07-30 MED ORDER — CALCIUM GLUCONATE-NACL 1-0.675 GM/50ML-% IV SOLN
1.0000 g | Freq: Once | INTRAVENOUS | Status: AC
  Administered 2021-07-30: 1000 mg via INTRAVENOUS
  Filled 2021-07-30: qty 50

## 2021-07-30 MED ORDER — POTASSIUM CHLORIDE 10 MEQ/100ML IV SOLN
10.0000 meq | INTRAVENOUS | Status: AC
  Administered 2021-07-30 – 2021-07-31 (×4): 10 meq via INTRAVENOUS
  Filled 2021-07-30 (×4): qty 100

## 2021-07-30 MED ORDER — FAMOTIDINE 20 MG PO TABS
20.0000 mg | ORAL_TABLET | Freq: Every day | ORAL | Status: DC
  Administered 2021-07-30 – 2021-08-10 (×12): 20 mg via ORAL
  Filled 2021-07-30 (×12): qty 1

## 2021-07-30 MED ORDER — CALCIUM CARBONATE ANTACID 500 MG PO CHEW
400.0000 mg | CHEWABLE_TABLET | Freq: Two times a day (BID) | ORAL | Status: AC
  Administered 2021-07-30 (×2): 400 mg via ORAL
  Filled 2021-07-30 (×2): qty 2

## 2021-07-30 MED ORDER — MAGNESIUM SULFATE 4 GM/100ML IV SOLN
4.0000 g | Freq: Once | INTRAVENOUS | Status: AC
  Administered 2021-07-30: 4 g via INTRAVENOUS
  Filled 2021-07-30: qty 100

## 2021-07-30 NOTE — ED Notes (Signed)
Placed bair hugger on pt for temp of 94.4 and notified admitting MD.

## 2021-07-30 NOTE — Progress Notes (Addendum)
Family Medicine Teaching Service Daily Progress Note Intern Pager: [phone]  Patient name: Jeffrey [name] Medical record number: [id number] Date of birth: 02/23/1966 Age: 55 y.o. Gender: male  Primary Care Provider: [name], MD Consultants: None Code Status: Full  Pt Overview and Major Events to Date:  10/23- Admitted  Assessment and Plan: Jeffrey [name] is a 55 year old male who presented after a fall, multiple electrolyte abnormalities and hypoglycemia ,in setting of seizure disorder. PMH significant for alcohol use disorder, erosive esophagitis, seizure disorder, PEA with cardiac arrest in 2014 requiring prolonged intubation.  Nonfulminant nonsevere Clostridium Dificile Infection C. Dificile + antigen/toxin. Remains afebrile without leukocytosis, WBC 8.3. Patient continuing to have diarrhea. Normotensive with BP 118/88, HR 80s. Will treat appropriately with Fidaxomicin as he is symptomatic with + labs. Will monitor for signs of severe CDI including WBC, Cr, hypotension, shock, ileus. - Start Fidaxomicin 200mg  BID for 10 days - Enteric precautions - Continue IVF - Monitor electrolytes - Daily CBC - Monitor VS  Syncope with history of recurrent falls Pt had a syncopal event with unresponsiveness for 2-3 minutes in the ED, not resembling tonic-clonic movement but did have bowel incontinence. Per chart review, he was admitted in 11/2020 with syncopal episodes found to have positive orthostatic hypotension and "convulsive syncope" per neurology. Ammonia 61 ->49. Not showing signs of hepatic encephalopathy including asterixis. He tells me he has not taken his Keppra for one month because he could not afford it. He also has not been eating appropriately at home for the past few months as he has had loss of appetite in addition to his erosive esophagitis. This is likely multifactorial including electrolyte abnormalities, infection with C. Dificile, and orthostatic hypotension with  dehydration. - PT/OT eval and treat - F/u UDS, HIV - Orthostatic vitals  - Seizure precautions - Consider EEG - F/u Keppra level  AKI, likely pre-renal Baseline Cr 0.9, serum Cr on admission 1.4.  Likely secondary to poor PO intake/diarrhea. - mIVF at 150 ml/hr + 37mEqKCl - Monitor I/O - Monitor with BMP - Avoid nephrotoxic agents  Multiple electrolyte abnormalities; hypokalemia, hypomagnesemia, hypocalcemia Clostridium dificle + antigen and toxin. Continuing to have diarrhea. Mg 1.8 after repletion. K+ . Recevied 6g Mg, continuing to replete. Received 2 K+ IV runs of, 16-mEq  PO K+, will continue to replete. Most recent K+ 2.9- will give 52mEq PO.  Repeat EKG with prolonged QT. Corrected Ca2+ 7.6. Pending ionized Ca2+. - Continuous cardiac monitoring - BMP q4 hours - EKG q4 hours; if EKG without glaring abnormality, will space q8h - Pending Mg+ - Pending ionized Ca2+ - 0.9 NaCl mIVF @ 150 ml/hr + 95mEq Kcl - TUMS - Replete Mg with 2g daily - Mg >2, K >4 goals  Protein Calorie Malnutrition Undetectable albumin. Says he has not eaten well for the past few years, and recently because of diarrhea. Will consult RD for additional insight into nutrition needs. - Multivitamin - Boost TID between meals - RD consult - Pepcid 20mg  daily - Discontinue Protonix - CBC  ETOH use disorder Patient has tried to quit in the past. Drinks 40oz beer daily, last drink 10/23. No withdrawal symptoms including tremors noted on exam. - CIWA q8hours, no PRN ativan - Thiamine, folic acid, multivitamin  Seizure disorder Follows outpatient with Dr. [name]. Prescribed Keppra, but reports noncompliance and has not taken it in one month 2/2 financial barriers of not being able to afford it. - Consider Keppra level - Investigate financial barrier - Start  home Keppra 1000mg  BID - Seizure precautions - Ensure OP neuro follow up  Hypoglycemia in setting of T2DM Most recent CBG: . Required D1- infusion for  CBG 42 last night. Last A1c: 5.7% in 06/2021. Holding home 12U Lantus. - CBG QID, at meals and bedtime - Restart long acting insulin when able - Discontinue D10  QTC prolongation QT 585. - Monitor with serial EKGs  Other conditions (HLD, Depression, OSA, liver cirrhosis) chronic and stable.  FEN/GI: Criss Rosales diet PPx: Lovenox Dispo:Pending PT recommendations  pending clinical improvement . Barriers include severity of illness requiring IV fluids, electrolytes, C.dif.   Subjective:  Jeffrey [name] says he feels unwell and needs to have a bowel movement, continuing to have diarrhea and he is requesting a bed pan.Says he hasn't taken Keppra for 1 month due to financial barriers. Lives at home alone, wife passed away in 2023/01/18.  Objective: Pulse Rate:  [80-90] 81 (10/24 0600) Resp:  [11-23] 14 (10/24 0600) BP: (103-131)/(73-109) 122/109 (10/24 0600) SpO2:  [96 %-100 %] 100 % (10/24 0600) Weight:  [112.5 kg] 112.5 kg (10/23 1948) Physical Exam: General: Resting comfortably in hospital bed, in no distress Cardiovascular: Regular rate and rhythm Respiratory: Normal respiratory effort. Clear breath sounds in all fields. Abdomen: Soft, normoactive bowel sounds. Non-tender to palpation, no abdominal distention. Extremities: Warm, dry, well-perfused  Laboratory: Recent Labs  Lab 07/29/21 1610  WBC 8.3  HGB 12.1*  HCT 33.0*  PLT 204   Recent Labs  Lab 07/29/21 1610 07/29/21 2249 07/30/21 0100  NA 129* 128* 126*  K <2.0* <2.0* 2.3*  CL 85* 83* 86*  CO2 25 30 27   BUN 5* 5* 5*  CREATININE 1.41* 1.43* 1.25*  CALCIUM 5.8* 5.6* 5.6*  PROT 6.0*  --   --   BILITOT 1.6*  --   --   ALKPHOS 195*  --   --   ALT 48*  --   --   AST 83*  --   --   GLUCOSE 70 180* 168*    Imaging/Diagnostic Tests: DG Chest 1 View  Result Date: 07/29/2021 CLINICAL DATA:  Weakness. EXAM: CHEST  1 VIEW COMPARISON:  12/17/2020 FINDINGS: The cardiomediastinal contours are normal. The lungs are clear. Pulmonary  vasculature is normal. No consolidation, pleural effusion, or pneumothorax. No acute osseous abnormalities are seen. Remote left lateral lower rib fracture IMPRESSION: No acute chest findings. Electronically Signed   By: Keith Rake M.D.   On: 07/29/2021 23:19   CT HEAD WO CONTRAST (5MM)  Result Date: 07/29/2021 CLINICAL DATA:  Dizziness EXAM: CT HEAD WITHOUT CONTRAST TECHNIQUE: Contiguous axial images were obtained from the base of the skull through the vertex without intravenous contrast. COMPARISON:  11/18/2020 FINDINGS: Brain: Age advanced cerebral and cerebellar atrophy. No acute intracranial abnormality. Specifically, no hemorrhage, hydrocephalus, mass lesion, acute infarction, or significant intracranial injury. Vascular: No hyperdense vessel or unexpected calcification. Skull: No acute calvarial abnormality. Sinuses/Orbits: No acute findings Other: None IMPRESSION: Age advanced atrophy.  No acute intracranial abnormality. Electronically Signed   By: Rolm Baptise M.D.   On: 07/29/2021 21:30     Orvis Brill, DO 07/30/2021, 7:09 AM PGY-1, [place] Intern pager: [phone], text pages welcome

## 2021-07-30 NOTE — ED Notes (Signed)
Placed flexiseal- pt tolerated well.

## 2021-07-30 NOTE — ED Notes (Signed)
Pt not capable of standing at this time for orthostatic vitals !

## 2021-07-30 NOTE — ED Notes (Signed)
Breakfast Orders placed 

## 2021-07-30 NOTE — Procedures (Signed)
Patient Name: Jeffrey [name]  MRN: [id number]  Epilepsy Attending: Lora Havens  Referring Physician/Provider: Dr [name] Date: 07/30/2021 Duration: 22.39 mins  Patient history: 54 year old male was syncopal event with unresponsiveness for 2 to 3 minutes as well as bowel incontinence.  EEG to evaluate for seizure.  Level of alertness: Awake  AEDs during EEG study: None  Technical aspects: This EEG study was done with scalp electrodes positioned according to the 10-20 International system of electrode placement. Electrical activity was acquired at a sampling rate of 500Hz  and reviewed with a high frequency filter of 70Hz  and a low frequency filter of 1Hz . EEG data were recorded continuously and digitally stored.   Description: The posterior dominant rhythm consists of 8Hz  activity of moderate voltage (25-35 uV) seen predominantly in posterior head regions, symmetric and reactive to eye opening and eye closing. Hyperventilation and photic stimulation were not performed.     IMPRESSION: This study is within normal limits. No seizures or epileptiform discharges were seen throughout the recording.  Jeffrey [name] Barbra Sarks

## 2021-07-30 NOTE — Progress Notes (Signed)
EEG complete - results pending 

## 2021-07-30 NOTE — Progress Notes (Signed)
In to see patient to gather further history. Patient easily arousable to voice and conversational despite recorded RR of 6.    Corky Sox, MD PGY-1 [place] Intern pager: [phone], text pages welcome

## 2021-07-30 NOTE — Progress Notes (Signed)
  Echocardiogram 2D Echocardiogram has been performed.  Jeffrey [name] 07/30/2021, 12:41 PM

## 2021-07-30 NOTE — Telephone Encounter (Signed)
Patient Advocate Encounter   Received request from [employer] for a test claim for fidaxomicin 200 mg BID x 10 days  (Dificid) Per Test Claim prior authorization for Dificid 200mg  is required.   PA submitted on 07/30/21 Key [id number] Status is pending

## 2021-07-30 NOTE — ED Notes (Signed)
EEG at bedside.

## 2021-07-30 NOTE — ED Notes (Signed)
Turned off bair hugger due to pt being normothermic

## 2021-07-31 LAB — BASIC METABOLIC PANEL
Anion gap: 6 (ref 5–15)
Anion gap: 6 (ref 5–15)
Anion gap: 7 (ref 5–15)
Anion gap: 6 (ref 5–15)
Anion gap: 9 (ref 5–15)
BUN: <5 mg/dL — ABNORMAL LOW (ref 6–20)
BUN: 5 mg/dL — ABNORMAL LOW (ref 6–20)
BUN: <5 mg/dL — ABNORMAL LOW (ref 6–20)
BUN: 5 mg/dL — ABNORMAL LOW (ref 6–20)
BUN: 5 mg/dL — ABNORMAL LOW (ref 6–20)
CO2: 25 mmol/L (ref 22–32)
CO2: 24 mmol/L (ref 22–32)
CO2: 16 mmol/L — ABNORMAL LOW (ref 22–32)
CO2: 26 mmol/L (ref 22–32)
CO2: 20 mmol/L — ABNORMAL LOW (ref 22–32)
Calcium: 5.6 mg/dL — CL (ref 8.9–10.3)
Calcium: 5.6 mg/dL — CL (ref 8.9–10.3)
Calcium: <4 mg/dL — CL (ref 8.9–10.3)
Calcium: 6.1 mg/dL — CL (ref 8.9–10.3)
Calcium: 6 mg/dL — CL (ref 8.9–10.3)
Chloride: 95 mmol/L — ABNORMAL LOW (ref 98–111)
Chloride: 97 mmol/L — ABNORMAL LOW (ref 98–111)
Chloride: 112 mmol/L — ABNORMAL HIGH (ref 98–111)
Chloride: 97 mmol/L — ABNORMAL LOW (ref 98–111)
Chloride: 102 mmol/L (ref 98–111)
Creatinine, Ser: 1.42 mg/dL — ABNORMAL HIGH (ref 0.61–1.24)
Creatinine, Ser: 1.37 mg/dL — ABNORMAL HIGH (ref 0.61–1.24)
Creatinine, Ser: 0.94 mg/dL (ref 0.61–1.24)
Creatinine, Ser: 1.34 mg/dL — ABNORMAL HIGH (ref 0.61–1.24)
Creatinine, Ser: 1.22 mg/dL (ref 0.61–1.24)
GFR, Estimated: 58 mL/min — ABNORMAL LOW (ref >60)
GFR, Estimated: >60 mL/min (ref >60)
GFR, Estimated: >60 mL/min (ref >60)
GFR, Estimated: >60 mL/min (ref >60)
GFR, Estimated: >60 mL/min (ref >60)
Glucose, Bld: 422 mg/dL — ABNORMAL HIGH (ref 70–99)
Glucose, Bld: 438 mg/dL — ABNORMAL HIGH (ref 70–99)
Glucose, Bld: 237 mg/dL — ABNORMAL HIGH (ref 70–99)
Glucose, Bld: 332 mg/dL — ABNORMAL HIGH (ref 70–99)
Glucose, Bld: 348 mg/dL — ABNORMAL HIGH (ref 70–99)
Potassium: 4.9 mmol/L (ref 3.5–5.1)
Potassium: 5 mmol/L (ref 3.5–5.1)
Potassium: 3.1 mmol/L — ABNORMAL LOW (ref 3.5–5.1)
Potassium: 5 mmol/L (ref 3.5–5.1)
Potassium: 5.2 mmol/L — ABNORMAL HIGH (ref 3.5–5.1)
Sodium: 126 mmol/L — ABNORMAL LOW (ref 135–145)
Sodium: 127 mmol/L — ABNORMAL LOW (ref 135–145)
Sodium: 135 mmol/L (ref 135–145)
Sodium: 129 mmol/L — ABNORMAL LOW (ref 135–145)
Sodium: 131 mmol/L — ABNORMAL LOW (ref 135–145)

## 2021-07-31 LAB — CBG MONITORING, ED
Glucose-Capillary: 426 mg/dL — ABNORMAL HIGH (ref 70–99)
Glucose-Capillary: 416 mg/dL — ABNORMAL HIGH (ref 70–99)
Glucose-Capillary: 323 mg/dL — ABNORMAL HIGH (ref 70–99)
Glucose-Capillary: 335 mg/dL — ABNORMAL HIGH (ref 70–99)

## 2021-07-31 LAB — BLOOD CULTURE ID PANEL (REFLEXED) - BCID2

## 2021-07-31 LAB — CBC WITH DIFFERENTIAL/PLATELET
Abs Immature Granulocytes: 0.04 10*3/uL (ref 0.00–0.07)
Basophils Absolute: 0 10*3/uL (ref 0.0–0.1)
Basophils Relative: 0 %
Eosinophils Absolute: 0 10*3/uL (ref 0.0–0.5)
Eosinophils Relative: 0 %
HCT: 27.3 % — ABNORMAL LOW (ref 39.0–52.0)
Hemoglobin: 9.8 g/dL — ABNORMAL LOW (ref 13.0–17.0)
Immature Granulocytes: 1 %
Lymphocytes Relative: 17 %
Lymphs Abs: 1.2 10*3/uL (ref 0.7–4.0)
MCH: 31.1 pg (ref 26.0–34.0)
MCHC: 35.9 g/dL (ref 30.0–36.0)
MCV: 86.7 fL (ref 80.0–100.0)
Monocytes Absolute: 0.6 10*3/uL (ref 0.1–1.0)
Monocytes Relative: 9 %
Neutro Abs: 4.9 10*3/uL (ref 1.7–7.7)
Neutrophils Relative %: 73 %
Platelets: 166 10*3/uL (ref 150–400)
RBC: 3.15 MIL/uL — ABNORMAL LOW (ref 4.22–5.81)
RDW: 19.1 % — ABNORMAL HIGH (ref 11.5–15.5)
WBC: 6.8 10*3/uL (ref 4.0–10.5)
nRBC: 0 % (ref 0.0–0.2)

## 2021-07-31 LAB — COMPREHENSIVE METABOLIC PANEL
ALT: 42 U/L (ref 0–44)
AST: 47 U/L — ABNORMAL HIGH (ref 15–41)
Albumin: <1.5 g/dL — ABNORMAL LOW (ref 3.5–5.0)
Alkaline Phosphatase: 168 U/L — ABNORMAL HIGH (ref 38–126)
Anion gap: 5 (ref 5–15)
BUN: 5 mg/dL — ABNORMAL LOW (ref 6–20)
CO2: 24 mmol/L (ref 22–32)
Calcium: 5.6 mg/dL — CL (ref 8.9–10.3)
Chloride: 98 mmol/L (ref 98–111)
Creatinine, Ser: 1.4 mg/dL — ABNORMAL HIGH (ref 0.61–1.24)
GFR, Estimated: 59 mL/min — ABNORMAL LOW (ref >60)
Glucose, Bld: 430 mg/dL — ABNORMAL HIGH (ref 70–99)
Potassium: 5.1 mmol/L (ref 3.5–5.1)
Sodium: 127 mmol/L — ABNORMAL LOW (ref 135–145)
Total Bilirubin: 1.5 mg/dL — ABNORMAL HIGH (ref 0.3–1.2)
Total Protein: 5.1 g/dL — ABNORMAL LOW (ref 6.5–8.1)

## 2021-07-31 LAB — MAGNESIUM
Magnesium: 2.6 mg/dL — ABNORMAL HIGH (ref 1.7–2.4)
Magnesium: 2.2 mg/dL (ref 1.7–2.4)
Magnesium: 1.5 mg/dL — ABNORMAL LOW (ref 1.7–2.4)
Magnesium: 2.2 mg/dL (ref 1.7–2.4)

## 2021-07-31 LAB — CALCIUM, IONIZED: Calcium, Ionized, Serum: 3.3 mg/dL — ABNORMAL LOW (ref 4.5–5.6)

## 2021-07-31 LAB — GLUCOSE, CAPILLARY
Glucose-Capillary: 301 mg/dL — ABNORMAL HIGH (ref 70–99)
Glucose-Capillary: 350 mg/dL — ABNORMAL HIGH (ref 70–99)

## 2021-07-31 LAB — HEMOGLOBIN A1C
Hgb A1c MFr Bld: 5.7 % — ABNORMAL HIGH (ref 4.8–5.6)
Mean Plasma Glucose: 116.89 mg/dL

## 2021-07-31 LAB — CK: Total CK: 517 U/L — ABNORMAL HIGH (ref 49–397)

## 2021-07-31 LAB — LEVETIRACETAM LEVEL: Levetiracetam Lvl: 20.3 ug/mL (ref 10.0–40.0)

## 2021-07-31 MED ORDER — INSULIN ASPART 100 UNIT/ML IJ SOLN: 0.0000 [IU] | Freq: Three times a day (TID) | INTRAMUSCULAR | Status: DC

## 2021-07-31 MED ORDER — INSULIN ASPART 100 UNIT/ML IJ SOLN: 10.0000 [IU] | Freq: Once | INTRAMUSCULAR | Status: DC

## 2021-07-31 MED ORDER — INSULIN GLARGINE-YFGN 100 UNIT/ML ~~LOC~~ SOLN
10.0000 [IU] | Freq: Every day | SUBCUTANEOUS | Status: DC
  Administered 2021-07-31 – 2021-08-02 (×3): 10 [IU] via SUBCUTANEOUS
  Filled 2021-07-31 (×4): qty 0.1

## 2021-07-31 MED ORDER — INSULIN GLARGINE-YFGN 100 UNIT/ML ~~LOC~~ SOLN
10.0000 [IU] | Freq: Every day | SUBCUTANEOUS | Status: DC
  Filled 2021-07-31: qty 0.1

## 2021-07-31 MED ORDER — POTASSIUM CHLORIDE CRYS ER 20 MEQ PO TBCR
40.0000 meq | EXTENDED_RELEASE_TABLET | Freq: Once | ORAL | Status: AC
  Administered 2021-07-31: 40 meq via ORAL
  Filled 2021-07-31: qty 2

## 2021-07-31 MED ORDER — INSULIN ASPART 100 UNIT/ML IJ SOLN
5.0000 [IU] | Freq: Once | INTRAMUSCULAR | Status: AC
  Administered 2021-07-31: 5 [IU] via SUBCUTANEOUS

## 2021-07-31 MED ORDER — GABAPENTIN 100 MG PO CAPS
100.0000 mg | ORAL_CAPSULE | Freq: Three times a day (TID) | ORAL | Status: DC
  Administered 2021-07-31 – 2021-08-02 (×7): 100 mg via ORAL
  Filled 2021-07-31 (×7): qty 1

## 2021-07-31 MED ORDER — CALCIUM GLUCONATE-NACL 2-0.675 GM/100ML-% IV SOLN
2.0000 g | Freq: Once | INTRAVENOUS | Status: AC
  Administered 2021-07-31: 2000 mg via INTRAVENOUS
  Filled 2021-07-31: qty 100

## 2021-07-31 MED ORDER — INSULIN ASPART 100 UNIT/ML IJ SOLN
0.0000 [IU] | Freq: Three times a day (TID) | INTRAMUSCULAR | Status: DC
  Administered 2021-07-31 (×2): 7 [IU] via SUBCUTANEOUS
  Administered 2021-08-01: 2 [IU] via SUBCUTANEOUS
  Administered 2021-08-01: 9 [IU] via SUBCUTANEOUS
  Administered 2021-08-01: 5 [IU] via SUBCUTANEOUS
  Administered 2021-08-02: 1 [IU] via SUBCUTANEOUS
  Administered 2021-08-03: 3 [IU] via SUBCUTANEOUS
  Administered 2021-08-03: 1 [IU] via SUBCUTANEOUS
  Administered 2021-08-04: 2 [IU] via SUBCUTANEOUS

## 2021-07-31 NOTE — Progress Notes (Signed)
Family Medicine Teaching Service Daily Progress Note Intern Pager: [phone]  Patient name: Jeffrey [name] Medical record number: [id number] Date of birth: 04-03-1966 Age: 55 y.o. Gender: male  Primary Care Provider: [name], MD Consultants: None Code Status: Full  Pt Overview and Major Events to Date:  10/23- Admitted  Assessment and Plan: [name] is a 55 year old male who presented after a fall with multiple electrolyte abnormalities that are resolving after repletion, found to be C. Dif. + receiving Fidaxomicin. PMH signification for alcohol use disorder, erosive esophagitis, seizure disorder, PEA with cardiac arrest in 2014 requiring prolonged intubation.  Nonfulminant nonsevere Clostridium Dificile infection On day #2/10 Fidaxomicin. Continuing to have watery diarrhea with electrolyte loss.Patient remains afebrile (98.7 F) and normotensive. WBC 6.8. Will continue to monitor for signs of severe CDI (increasing WBC, Cr, hypotension, shock, ileus) - Serial abdominal exams - Enteric precautions - IVF @ 150 ml/hr - Continue Fidaxomicin (10/24-11/4), day #2 - Daily CBC - Monitor VS  Syncope with history of recurrent falls likely 2/2 dehydration/electrolyte imbalance and poor compliance with AED EEG without seizure or epileptiform changes. Will schedule outpatient neurology follow-up with Dr.  [name].  - Continue Keppra - Obtain orthostatic vital signs today - PT/OT eval and treat - Seizure precautions - Fall precautions  AKI, likely pre-renal- Improving Cr 0.94. Baseline 0.9. Likely secondary to diarrhea/dehydration. Cr downtrending, will monitor. - Continue NS IVF @ 122ml/hr - Monitor I/O - Monitor with BMP  Metabolic disarray: Hypokalemia, hypomagnasemia, hypocalcemia K+ 2.5 this morning, will replete with 79meq Klor-con. Received 2g Ca gluconate overnight, Ca2+ <4 this morning, likely hemodilution given IVF. Corrected Na+ 132. Mg 2.2, then downtrended to 1.5.  Will replete with IV 2g Mg. Likely secondary to ongoing losses with diarrhea. CK 832 - BMP q8hours, repeat BMP at 1400 - Replete as necessary - Repeat CK - F/u ionized Calcium (likely to return tomorrow)  Hypothermia- resolved CK 832. Culture- no growth <24 hours. 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 - Monitor VS closely  T2DM A1c 5.7%. CBG running high 400s. Lantus 20 u daily at home, metformin, farxiga. Received 5u novolog last night for CBG 438. - Hold home medications - Start sSSI - Consider Lantus 10u when electrolyte abnormalities resolve - CBG q4h, at meals and bedtime  QTC prolongation QT interval: 503, improving. Likely secondary to electrolyte abnormalities. - EKG q8 hours  Bilateral lower extremity neuropathy Chronic. Home medications: Gabapentin 900mg  TID. - Start Gabapentin 100mg  TID  FEN/GI: Bland PPx: Lovenox Dispo:Pending PT recommendations  pending clinical improvement . Barriers include severity of illness requiring IV fluids, electrolytes, C. Dif.   Subjective:  Mr. [name] continues to have profuse diarrhea. He denies chest pain, abdominal pain, shortness of breath, dizziness. He says he wants to get up and walk around.  Objective: Temp:  [94.4 F (34.7 C)-98.4 F (36.9 C)] 97.8 F (36.6 C) (10/24 1900) Pulse Rate:  [82-102] 82 (10/25 0700) Resp:  [11-22] 15 (10/25 0700) BP: (95-129)/(58-104) 107/82 (10/25 0700) SpO2:  [97 %-100 %] 100 % (10/25 0700) Physical Exam: General: Sitting at edge of the bed, in no disress Cardiovascular: Regular rate and rhythm Respiratory: Normal respiratory effort. Clear breath sounds in all fields Abdomen: Soft, normoactive bowel sounds. No tenderness to palpation in any quadrants. Mild abdominal distention. Extremities: Warm, dry, well-perfused. Chronic venous stasis changes and skin hyperpigmentation/thickening in bilateral lower extremities with edema.  Laboratory: Recent Labs  Lab 07/29/21 1610  07/31/21 0542  WBC 8.3 6.8  HGB 12.1* 9.8*  HCT 33.0* 27.3*  PLT 204 166   Recent Labs  Lab 07/29/21 1610 07/29/21 2249 07/30/21 0700 07/30/21 1108 07/30/21 2300 07/31/21 0158 07/31/21 0542  NA 129*   < > 128*   < > 126* 127* 127*  K <2.0*   < > 2.9*   < > 4.9 5.0 5.1  CL 85*   < > 87*   < > 95* 97* 98  CO2 25   < > 29   < > 25 24 24   BUN 5*   < > 6   < > <5* 5* 5*  CREATININE 1.41*   < > 1.35*   < > 1.42* 1.37* 1.40*  CALCIUM 5.8*   < > 5.6*   < > 5.6* 5.6* 5.6*  PROT 6.0*  --  5.7*  --   --   --  5.1*  BILITOT 1.6*  --  1.7*  --   --   --  1.5*  ALKPHOS 195*  --  185*  --   --   --  168*  ALT 48*  --  45*  --   --   --  42  AST 83*  --  68*  --   --   --  47*  GLUCOSE 70   < > 135*   < > 422* 438* 430*   < > = values in this interval not displayed.    Imaging/Diagnostic Tests: No results found.   Orvis Brill, DO 07/31/2021, 8:14 AM PGY-1, [place] Intern pager: [phone], text pages welcome

## 2021-07-31 NOTE — Progress Notes (Signed)
FPTS Interim Progress Note  S: on interview patient reports continued decrease in frequency and volume of bowel movements. He denies ABD pain and subjective fever.   O: BP (!) 116/91 (BP Location: Right Arm)   Pulse 88   Temp (!) 97.4 F (36.3 C) (Oral)   Resp 19   Ht 5\' 10"  (1.778 m)   Wt 248 lb (112.5 kg)   SpO2 100%   BMI 35.58 kg/m   Physical Exam General: middle age male lying in bed, NAD CV: RRR, NMRG Resp: unlabored respirations ABD: NTTP, good BS  A/P: Patient with C. Diff colitis and positive orthostatics, severe electrolyte abnormalities now correcting. K of 5.0, corrected Ca of 8.5, and Qtc of 509.  -Next BMP and mag at midnight -Ensure EKG is performed around this time as well   Corky Sox, MD PGY-1 [place] Intern pager: [phone], text pages welcome

## 2021-07-31 NOTE — ED Notes (Signed)
MD Dameron made aware of Calcium  < 4 and Potassium 3.1

## 2021-07-31 NOTE — Plan of Care (Signed)

## 2021-07-31 NOTE — Evaluation (Signed)
Physical Therapy Evaluation Patient Details Name: Jeffrey [name] MRN: [id number] DOB: [birth year] Today's Date: 07/31/2021  History of Present Illness  Pt is a 55 y.o. F who presents 07/29/2021 with a fall and multiple electrolyte abnormalities, found to be C. Dif. +, receiving Fidaxomicin. Significant PMH: alcohol use disorder, erosive espophagitis, seizure disorder, PEA with cardiac arrest in 2014 requiring prolonged intubation.  Clinical Impression  Prior to admission, pt uses a quad cane for ambulation, is independent with ADL's, and attends OPPT. Pt reports 9 falls within past week. Upon assessment, pt is significantly orthostatic with positional changes (see below). Requiring min assist for transfers and further ambulation deferred in setting of hypotension. Notified RN. Suspect pt will progress once medically stable. Will continue to follow acutely and progress mobility as tolerated.  Orthostatic Vital Signs: Supine: 131/92 (104) Sitting: 115/96 (104) Standing: 69/56 (63) Sitting: 96/54 (68)     Recommendations for follow up therapy are one component of a multi-disciplinary discharge planning process, led by the attending physician.  Recommendations may be updated based on patient status, additional functional criteria and insurance authorization.  Follow Up Recommendations Home health PT    Assistance Recommended at Discharge Frequent or constant Supervision/Assistance  Functional Status Assessment Patient has had a recent decline in their functional status and demonstrates the ability to make significant improvements in function in a reasonable and predictable amount of time.  Equipment Recommendations  3in1 (PT)    Recommendations for Other Services       Precautions / Restrictions Precautions Precautions: Fall;Other (comment) Precaution Comments: orthostatic hypotension Restrictions Weight Bearing Restrictions: No      Mobility  Bed Mobility Overal bed mobility:  Needs Assistance Bed Mobility: Supine to Sit;Sit to Supine     Supine to sit: Min guard Sit to supine: Min assist   General bed mobility comments: LE management back into bed    Transfers Overall transfer level: Needs assistance Equipment used: Quad cane Transfers: Sit to/from Stand Sit to Stand: Min assist           General transfer comment: Assist to steady    Ambulation/Gait             General Gait Details: deferred due to low BP  Stairs            Wheelchair Mobility    Modified Rankin (Stroke Patients Only)       Balance Overall balance assessment: Needs assistance Sitting-balance support: Feet supported Sitting balance-Leahy Scale: Fair     Standing balance support: Reliant on assistive device for balance Standing balance-Leahy Scale: Poor Standing balance comment: increased A/P sway, minA for balance, likely due to hypotension                             Pertinent Vitals/Pain Pain Assessment: No/denies pain    Home Living Family/patient expects to be discharged to:: Private residence Living Arrangements: Children (step children) Available Help at Discharge: Family;Friend(s) Type of Home: House Home Access: Stairs to enter   [employer] of Steps: 3   Home Layout: One level Home Equipment: [place] (2 wheels)      Prior Function Prior Level of Function : Independent/Modified Independent             Mobility Comments: using cane       Hand Dominance   Dominant Hand: Right    Extremity/Trunk Assessment   Upper Extremity Assessment Upper Extremity Assessment: Defer  to OT evaluation    Lower Extremity Assessment Lower Extremity Assessment: Generalized weakness    Cervical / Trunk Assessment Cervical / Trunk Assessment: Normal  Communication   Communication: HOH  Cognition Arousal/Alertness: Awake/alert Behavior During Therapy: WFL for tasks assessed/performed Overall  Cognitive Status: No family/caregiver present to determine baseline cognitive functioning                                 General Comments: Potentially low health literacy        General Comments      Exercises General Exercises - Lower Extremity Ankle Circles/Pumps: Both;20 reps;Seated Long Arc Quad: Both;10 reps;Seated   Assessment/Plan    PT Assessment Patient needs continued PT services  PT Problem List Decreased strength;Decreased activity tolerance;Decreased balance;Decreased mobility       PT Treatment Interventions DME instruction;Gait training;Stair training;Functional mobility training;Therapeutic activities;Therapeutic exercise;Balance training;Patient/family education    PT Goals (Current goals can be found in the Care Plan section)  Acute Rehab PT Goals Patient Stated Goal: continue OPPT PT Goal Formulation: With patient Time For Goal Achievement: 08/14/21 Potential to Achieve Goals: Fair    Frequency Min 3X/week   Barriers to discharge        Co-evaluation               AM-PAC PT "6 Clicks" Mobility  Outcome Measure Help needed turning from your back to your side while in a flat bed without using bedrails?: None Help needed moving from lying on your back to sitting on the side of a flat bed without using bedrails?: A Little Help needed moving to and from a bed to a chair (including a wheelchair)?: A Little Help needed standing up from a chair using your arms (e.g., wheelchair or bedside chair)?: A Little Help needed to walk in hospital room?: A Lot Help needed climbing 3-5 steps with a railing? : Total 6 Click Score: 16    End of Session Equipment Utilized During Treatment: Gait belt Activity Tolerance: Patient tolerated treatment well Patient left: in bed;with call bell/phone within reach;with bed alarm set Nurse Communication: Mobility status;Other (comment) (low BP) PT Visit Diagnosis: Unsteadiness on feet (R26.81);Muscle  weakness (generalized) (M62.81);History of falling (Z91.81);Difficulty in walking, not elsewhere classified (R26.2)    Time: 3704-8889 PT Time Calculation (min) (ACUTE ONLY): 32 min   Charges:   PT Evaluation $PT Eval Moderate Complexity: 1 Mod PT Treatments $Therapeutic Activity: 8-22 mins        Wyona Almas, PT, DPT Acute Rehabilitation Services Pager [phone] Office [phone]   Deno Etienne 07/31/2021, 5:01 PM

## 2021-07-31 NOTE — Progress Notes (Signed)
Inpatient Diabetes Program Recommendations  AACE/ADA: New Consensus Statement on Inpatient Glycemic Control (2015)  Target Ranges:  Prepandial:   less than 140 mg/dL      Peak postprandial:   less than 180 mg/dL (1-2 hours)      Critically ill patients:  140 - 180 mg/dL   Lab Results  Component Value Date   GLUCAP 323 (H) 07/31/2021   HGBA1C 5.7 (H) 07/31/2021    Review of Glycemic Control Results for KENYON, EICHELBERGER (MRN [id number]) as of 07/31/2021 10:14  Ref. Range 07/30/2021 17:18 07/30/2021 23:40 07/31/2021 02:05 07/31/2021 05:41 07/31/2021 09:32  Glucose-Capillary Latest Ref Range: 70 - 99 mg/dL 325 (H) 401 (H) 426 (H) 416 (H) 323 (H)   Diabetes history: DM2 Outpatient Diabetes medications: Lantus 20 units, Farxiga 10 qd, Metformin 500 mg bid Current orders for Inpatient glycemic control: None  Inpatient Diabetes Program Recommendations:   CBGs ranging 325-426. -Add Lantus 10 units qd (start now) -Add Novolog 0-9 units tid + hs 0-5 units Secure chat sent to Dr. Orvis Brill  Thank you, Nani Gasser. Terrilynn Postell, RN, MSN, CDE  Diabetes Coordinator Inpatient Glycemic Control Team Team Pager [phone] (8am-5pm) 07/31/2021 10:20 AM

## 2021-07-31 NOTE — Progress Notes (Addendum)
FPTS Brief Progress Note  S:Patient initially upset about his inability to walk and prolonged process of being able to find answers as far as a diagnosis.  Patient denies any abdominal pain, chest pain or chest pressure or difficulty breathing.  Reports that he has had decreased appetite for a while.  RN is at bedside and states that patient has been able to clear the trays that have been brought to the bedside. Patient also mentions difficulty with affording his seizure medications once discharged from the hospital and is requesting assistance with this.    O: BP 101/75   Pulse 88   Temp 97.8 F (36.6 C) (Oral)   Resp (!) 22   Ht 5\' 10"  (1.778 m)   Wt 112.5 kg   SpO2 97%   BMI 35.58 kg/m   General: Obese male appearing stated age in no acute distress, lying supine on hospital stretcher, food particles on patient's chest Cardio: Normal S1 and S2, no S3 or S4. Rhythm is regular. No murmurs or rubs.   Pulm: Clear to auscultation bilaterally, no crackles, wheezing, or diminished breath sounds. Normal respiratory effort, stable on room air, speaking in clear sentences Abdomen: Bowel sounds present in all 4 quadrants, abdomen soft and non-tender.  Extremities: Dry skin on bilateral lower extremities, do not appreciate significant pitting edema Neuro: pt alert and oriented x4   A/P: Patient is a 55 year old male admitted with multiple electrolyte abnormalities in diarrhea in the setting of difficulty with ambulation and multiple falls. -Repeat EKG ordered for in the morning, most recent EKG without any T wave abnormalities -BMP from 2300 pending, will follow-up with potassium once these results are available, patient currently receiving run number 3 out of 6 for potassium replacement, BMP to follow around 4 or 5 AM - Temp stabilized,no longer requiring bare hugger  -Less CBG 401, patient tolerating p.o., will await results of potassium prior to ordering insulin - Orders reviewed. Labs for AM  ordered, which was adjusted as needed.    UPDATE 0400 K improved to 5.0, DC remaining three runs of K IV  Hyponatremia (corrects to 132), will monitor, continue NS IVF  Hyperglycemia 438, will order 5 units Novolog once   Eulis Foster, MD 07/31/2021, 12:35 AM PGY-3, [facility] Family [facility] Night Resident  Please page [phone] with questions.

## 2021-07-31 NOTE — Progress Notes (Signed)
PHARMACY - PHYSICIAN COMMUNICATION CRITICAL VALUE ALERT - BLOOD CULTURE IDENTIFICATION (BCID)  Assessment:  Jeffrey [name] is an 55 y.o. male who presented to [facility] on 07/29/2021 with a chief complaint of non-fulminant non-severe C diff infection resulting in electrolyte abnormalities. Patient is now growing staphylococcus species in 2/2 blood cultures, with no species or resistance mechanisms detected on BCID.   Name of physician (or Provider) Contacted: Orvis Brill, DO  Current antibiotics: fidaxomicin  Changes to prescribed antibiotics recommended:  No changes recommended- likely contaminant   Results for orders placed or performed during the hospital encounter of 07/29/21  Blood Culture ID Panel (Reflexed) (Collected: 07/30/2021  5:04 PM)  Result Value Ref Range   Enterococcus faecalis NOT DETECTED NOT DETECTED   Enterococcus Faecium NOT DETECTED NOT DETECTED   Listeria monocytogenes NOT DETECTED NOT DETECTED   Staphylococcus species DETECTED (A) NOT DETECTED   Staphylococcus aureus (BCID) NOT DETECTED NOT DETECTED   Staphylococcus epidermidis NOT DETECTED NOT DETECTED   Staphylococcus lugdunensis NOT DETECTED NOT DETECTED   Streptococcus species NOT DETECTED NOT DETECTED   Streptococcus agalactiae NOT DETECTED NOT DETECTED   Streptococcus pneumoniae NOT DETECTED NOT DETECTED   Streptococcus pyogenes NOT DETECTED NOT DETECTED   A.calcoaceticus-baumannii NOT DETECTED NOT DETECTED   Bacteroides fragilis NOT DETECTED NOT DETECTED   Enterobacterales NOT DETECTED NOT DETECTED   Enterobacter cloacae complex NOT DETECTED NOT DETECTED   Escherichia coli NOT DETECTED NOT DETECTED   Klebsiella aerogenes NOT DETECTED NOT DETECTED   Klebsiella oxytoca NOT DETECTED NOT DETECTED   Klebsiella pneumoniae NOT DETECTED NOT DETECTED   Proteus species NOT DETECTED NOT DETECTED   Salmonella species NOT DETECTED NOT DETECTED   Serratia marcescens NOT DETECTED NOT DETECTED   Haemophilus  influenzae NOT DETECTED NOT DETECTED   Neisseria meningitidis NOT DETECTED NOT DETECTED   Pseudomonas aeruginosa NOT DETECTED NOT DETECTED   Stenotrophomonas maltophilia NOT DETECTED NOT DETECTED   Candida albicans NOT DETECTED NOT DETECTED   Candida auris NOT DETECTED NOT DETECTED   Candida glabrata NOT DETECTED NOT DETECTED   Candida krusei NOT DETECTED NOT DETECTED   Candida parapsilosis NOT DETECTED NOT DETECTED   Candida tropicalis NOT DETECTED NOT DETECTED   Cryptococcus neoformans/gattii NOT DETECTED NOT DETECTED    Zakeya Junker A Joriel Streety 07/31/2021  2:14 PM

## 2021-07-31 NOTE — ED Notes (Signed)
ED TO INPATIENT HANDOFF REPORT  ED Nurse Name and Phone #: Bryson Ha, RN  S Name/Age/Gender Jeffrey [name] 55 y.o. male Room/Bed: 017C/017C  Code Status   Code Status: Full Code  Home/SNF/Other Home Patient oriented to: self, place, time, and situation Is this baseline? Yes   Triage Complete: Triage complete  Chief Complaint Hypokalemia [E87.6] Recurrent falls [R29.6]  Triage Note Pt here via EMS from home with c/o of multiple falls X2 months. Non compliant with diabetic medication. CBG 42. Given PB sandwich with increase to 102.  History of ETOH abuse.   138/108 HR 94 100% RA 96.5 T    Allergies Allergies  Allergen Reactions   Lisinopril Other (See Comments)    Pt reports nose bleed.   Atorvastatin     Cause Muscle Weakness   Drug Ingredient [Spironolactone]     Hyperkalemia    Level of Care/Admitting Diagnosis ED Disposition     ED Disposition  Admit   Condition  --   [place]: [place] [100100]  Level of Care: Telemetry Medical [104]  May admit patient to Jeffrey [name] or Jeffrey [name] if equivalent level of care is available:: No  Covid Evaluation: Confirmed COVID Negative  Diagnosis: Recurrent falls [676195]  Admitting Physician: Corky Sox [[id number]]  Attending Physician: Martyn Malay [[id number]]  Estimated length of stay: past midnight tomorrow  Certification:: I certify this patient will need inpatient services for at least 2 midnights          B Medical/Surgery History Past Medical History:  Diagnosis Date   Acute renal insufficiency 12/08/2012   AKI (acute kidney injury) ([place])    ALCOHOL ABUSE, HX OF 11/10/2007   Anoxic encephalopathy ([place])    Anxiety    Bleeding ulcer 2014   Blood transfusion 2014   "related to bleeding ulcer"   Cardiac arrest ([place]) 12/07/2012   Anoxic encephalopathy   GERD (gastroesophageal reflux disease)    High cholesterol    Hypertension    OSA on CPAP 12/07/2012    Seizure-like activity ([place])    Seizures ([place])    last seizure 01/05/2021   Type II diabetes mellitus ([place])    Venous insufficiency 12/13/2011   Past Surgical History:  Procedure Laterality Date   BIOPSY  11/01/2020   Procedure: BIOPSY;  Surgeon: Irene Shipper, MD;  Location: [facility] ENDOSCOPY;  Service: Endoscopy;;   CARDIOVASCULAR STRESS TEST  03/16/13   Very Poor Exercise Tolerance; NON DIAGNOSTIC TEST   CATARACT EXTRACTION W/ INTRAOCULAR LENS IMPLANT Left 03/07/2014   [place] @ [facility]   ESOPHAGOGASTRODUODENOSCOPY Left 11/26/2012   Procedure: ESOPHAGOGASTRODUODENOSCOPY (EGD);  Surgeon: Wonda Horner, MD;  Location: [facility] ENDOSCOPY;  Service: Endoscopy;  Laterality: Left;   ESOPHAGOGASTRODUODENOSCOPY N/A 10/10/2014   Procedure: ESOPHAGOGASTRODUODENOSCOPY (EGD);  Surgeon: Ladene Artist, MD;  Location: [facility] ENDOSCOPY;  Service: Endoscopy;  Laterality: N/A;   ESOPHAGOGASTRODUODENOSCOPY (EGD) WITH PROPOFOL N/A 11/01/2020   Procedure: ESOPHAGOGASTRODUODENOSCOPY (EGD) WITH PROPOFOL;  Surgeon: Irene Shipper, MD;  Location: [facility] ENDOSCOPY;  Service: Endoscopy;  Laterality: N/A;   KNEE ARTHROSCOPY Left ~ 1999   UPPER GASTROINTESTINAL ENDOSCOPY       A IV Location/Drains/Wounds Patient Lines/Drains/Airways Status     Active Line/Drains/Airways     Name Placement date Placement time Site Days   Peripheral IV 07/29/21 20 G 1" Posterior;Right Hand 07/29/21  1635  Hand  2   Peripheral IV 07/29/21 20 G Right Antecubital 07/29/21  2031  Antecubital  2   Flatus Tube/Pouch 07/30/21  1142  --  1            Intake/Output Last 24 hours  Intake/Output Summary (Last 24 hours) at 07/31/2021 1229 Last data filed at 07/31/2021 1122 Gross per 24 hour  Intake 150 ml  Output 650 ml  Net -500 ml    Labs/Imaging Results for orders placed or performed during the hospital encounter of 07/29/21 (from the past 48 hour(s))  CBC with Differential/Platelet     Status: Abnormal   Collection Time: 07/29/21   4:10 PM  Result Value Ref Range   WBC 8.3 4.0 - 10.5 K/uL   RBC 3.89 (L) 4.22 - 5.81 MIL/uL   Hemoglobin 12.1 (L) 13.0 - 17.0 g/dL   HCT 33.0 (L) 39.0 - 52.0 %   MCV 84.8 80.0 - 100.0 fL   MCH 31.1 26.0 - 34.0 pg   MCHC 36.7 (H) 30.0 - 36.0 g/dL   RDW 17.3 (H) 11.5 - 15.5 %   Platelets 204 150 - 400 K/uL   nRBC 0.0 0.0 - 0.2 %   Neutrophils Relative % 64 %   Neutro Abs 5.3 1.7 - 7.7 K/uL   Lymphocytes Relative 26 %   Lymphs Abs 2.1 0.7 - 4.0 K/uL   Monocytes Relative 10 %   Monocytes Absolute 0.8 0.1 - 1.0 K/uL   Eosinophils Relative 0 %   Eosinophils Absolute 0.0 0.0 - 0.5 K/uL   Basophils Relative 0 %   Basophils Absolute 0.0 0.0 - 0.1 K/uL   Immature Granulocytes 0 %   Abs Immature Granulocytes 0.02 0.00 - 0.07 K/uL    Comment: Performed at [facility], [address] [place], [place] [address]  Comprehensive metabolic panel     Status: Abnormal   Collection Time: 07/29/21  4:10 PM  Result Value Ref Range   Sodium 129 (L) 135 - 145 mmol/L   Potassium <2.0 (LL) 3.5 - 5.1 mmol/L    Comment: CRITICAL RESULT CALLED TO, READ BACK BY AND VERIFIED WITH: L TURNER RN BY SSTEPHENS 1936 [id number]    Chloride 85 (L) 98 - 111 mmol/L   CO2 25 22 - 32 mmol/L   Glucose, Bld 70 70 - 99 mg/dL    Comment: Glucose reference range applies only to samples taken after fasting for at least 8 hours.   BUN 5 (L) 6 - 20 mg/dL   Creatinine, Ser 1.41 (H) 0.61 - 1.24 mg/dL   Calcium 5.8 (LL) 8.9 - 10.3 mg/dL    Comment: CRITICAL RESULT CALLED TO, READ BACK BY AND VERIFIED WITH: L TURNER RN BY SSTEPHENS 1936 [id number]    Total Protein 6.0 (L) 6.5 - 8.1 g/dL   Albumin <1.5 (L) 3.5 - 5.0 g/dL   AST 83 (H) 15 - 41 U/L   ALT 48 (H) 0 - 44 U/L   Alkaline Phosphatase 195 (H) 38 - 126 U/L   Total Bilirubin 1.6 (H) 0.3 - 1.2 mg/dL   GFR, Estimated 58 (L) >60 mL/min    Comment: (NOTE) Calculated using the CKD-EPI Creatinine Equation (2021)    Anion gap 19 (H) 5 - 15    Comment: Performed at [facility], [place] [address] [place], [place] [address]  Troponin I (High Sensitivity)     Status: Abnormal   Collection Time: 07/29/21  4:10 PM  Result Value Ref Range   Troponin I (High Sensitivity) 23 (H) <18 ng/L    Comment: (NOTE) Elevated  high sensitivity troponin I (hsTnI) values and significant  changes across serial measurements may suggest ACS but many other  chronic and acute conditions are known to elevate hsTnI results.  Refer to the Links section for chest pain algorithms and additional  guidance. Performed at [facility], [place] [address], [place], [place] [address]   Magnesium     Status: Abnormal   Collection Time: 07/29/21  4:10 PM  Result Value Ref Range   Magnesium 0.7 (LL) 1.7 - 2.4 mg/dL    Comment: CRITICAL RESULT CALLED TO, READ BACK BY AND VERIFIED WITH: L TURNER RN BY SSTEPHENS 1936 [id number] Performed at [facility], [place] [address] [place], [place] [address]   Ammonia     Status: Abnormal   Collection Time: 07/29/21  4:11 PM  Result Value Ref Range   Ammonia 61 (H) 9 - 35 umol/L    Comment: Performed at [place] [address], [place], [place] [address]  Resp Panel by RT-PCR (Flu A&B, Covid) Nasopharyngeal Swab     Status: None   Collection Time: 07/29/21  4:11 PM   Specimen: Nasopharyngeal Swab; Nasopharyngeal(NP) swabs in vial transport medium  Result Value Ref Range   SARS Coronavirus 2 by RT PCR NEGATIVE NEGATIVE    Comment: (NOTE) SARS-CoV-2 target nucleic acids are NOT DETECTED.  The SARS-CoV-2 RNA is generally detectable in upper respiratory specimens during the acute phase of infection. The lowest concentration of SARS-CoV-2 viral copies this assay can detect is 138 copies/mL. A negative result does not preclude SARS-Cov-2 infection and should not be used as the sole basis for treatment or other patient management decisions. A negative result may occur with  improper specimen collection/handling, submission of specimen  other than nasopharyngeal swab, presence of viral mutation(s) within the areas targeted by this assay, and inadequate number of viral copies(<138 copies/mL). A negative result must be combined with clinical observations, patient history, and epidemiological information. The expected result is Negative.  Fact Sheet for Patients:  EntrepreneurPulse.com.au  Fact Sheet for Healthcare Providers:  IncredibleEmployment.be  This test is no t yet approved or cleared by the [place] FDA and  has been authorized for detection and/or diagnosis of SARS-CoV-2 by FDA under an Emergency Use Authorization (EUA). This EUA will remain  in effect (meaning this test can be used) for the duration of the COVID-19 declaration under Section 564(b)(1) of the Act, 21 U.S.C.section 360bbb-3(b)(1), unless the authorization is terminated  or revoked sooner.       Influenza A by PCR NEGATIVE NEGATIVE   Influenza B by PCR NEGATIVE NEGATIVE    Comment: (NOTE) The Xpert Xpress SARS-CoV-2/FLU/RSV plus assay is intended as an aid in the diagnosis of influenza from Nasopharyngeal swab specimens and should not be used as a sole basis for treatment. Nasal washings and aspirates are unacceptable for Xpert Xpress SARS-CoV-2/FLU/RSV testing.  Fact Sheet for Patients: EntrepreneurPulse.com.au  Fact Sheet for Healthcare Providers: IncredibleEmployment.be  This test is not yet approved or cleared by the [place] FDA and has been authorized for detection and/or diagnosis of SARS-CoV-2 by FDA under an Emergency Use Authorization (EUA). This EUA will remain in effect (meaning this test can be used) for the duration of the COVID-19 declaration under Section 564(b)(1) of the Act, 21 U.S.C. section 360bbb-3(b)(1), unless the authorization is terminated or revoked.  Performed at [facility], [place] [address], [place], [place] [address]    Ethanol     Status:  Abnormal   Collection Time: 07/29/21  4:11 PM  Result Value Ref Range   Alcohol, Ethyl (B) 42 (H) <10 mg/dL    Comment: (NOTE) Lowest detectable limit for serum alcohol is 10 mg/dL.  For medical purposes only. Performed at [facility], [place] [address], [place], [place] [address]   CBG monitoring, ED     Status: Abnormal   Collection Time: 07/29/21  6:02 PM  Result Value Ref Range   Glucose-Capillary 157 (H) 70 - 99 mg/dL    Comment: Glucose reference range applies only to samples taken after fasting for at least 8 hours.  CBG monitoring, ED     Status: Abnormal   Collection Time: 07/29/21  8:00 PM  Result Value Ref Range   Glucose-Capillary 162 (H) 70 - 99 mg/dL    Comment: Glucose reference range applies only to samples taken after fasting for at least 8 hours.  Resp Panel by RT-PCR (Flu A&B, Covid) Nasopharyngeal Swab     Status: None   Collection Time: 07/29/21  8:42 PM   Specimen: Nasopharyngeal Swab; Nasopharyngeal(NP) swabs in vial transport medium  Result Value Ref Range   SARS Coronavirus 2 by RT PCR NEGATIVE NEGATIVE    Comment: (NOTE) SARS-CoV-2 target nucleic acids are NOT DETECTED.  The SARS-CoV-2 RNA is generally detectable in upper respiratory specimens during the acute phase of infection. The lowest concentration of SARS-CoV-2 viral copies this assay can detect is 138 copies/mL. A negative result does not preclude SARS-Cov-2 infection and should not be used as the sole basis for treatment or other patient management decisions. A negative result may occur with  improper specimen collection/handling, submission of specimen other than nasopharyngeal swab, presence of viral mutation(s) within the areas targeted by this assay, and inadequate number of viral copies(<138 copies/mL). A negative result must be combined with clinical observations, patient history, and epidemiological information. The expected result is Negative.  Fact Sheet  for Patients:  EntrepreneurPulse.com.au  Fact Sheet for Healthcare Providers:  IncredibleEmployment.be  This test is no t yet approved or cleared by the [place] FDA and  has been authorized for detection and/or diagnosis of SARS-CoV-2 by FDA under an Emergency Use Authorization (EUA). This EUA will remain  in effect (meaning this test can be used) for the duration of the COVID-19 declaration under Section 564(b)(1) of the Act, 21 U.S.C.section 360bbb-3(b)(1), unless the authorization is terminated  or revoked sooner.       Influenza A by PCR NEGATIVE NEGATIVE   Influenza B by PCR NEGATIVE NEGATIVE    Comment: (NOTE) The Xpert Xpress SARS-CoV-2/FLU/RSV plus assay is intended as an aid in the diagnosis of influenza from Nasopharyngeal swab specimens and should not be used as a sole basis for treatment. Nasal washings and aspirates are unacceptable for Xpert Xpress SARS-CoV-2/FLU/RSV testing.  Fact Sheet for Patients: EntrepreneurPulse.com.au  Fact Sheet for Healthcare Providers: IncredibleEmployment.be  This test is not yet approved or cleared by the [place] FDA and has been authorized for detection and/or diagnosis of SARS-CoV-2 by FDA under an Emergency Use Authorization (EUA). This EUA will remain in effect (meaning this test can be used) for the duration of the COVID-19 declaration under Section 564(b)(1) of the Act, 21 U.S.C. section 360bbb-3(b)(1), unless the authorization is terminated or revoked.  Performed at [facility], [place] [address] [place], [place] [address]   Magnesium     Status: Abnormal   Collection Time: 07/29/21 10:09 PM  Result Value Ref Range  Magnesium 1.1 (L) 1.7 - 2.4 mg/dL    Comment: Performed at [facility], [place] [address], [place], [place] [address]  Basic metabolic panel     Status: Abnormal   Collection Time: 07/29/21 10:49 PM  Result Value  Ref Range   Sodium 128 (L) 135 - 145 mmol/L    Comment: SPECIMEN RUN ON PREVIOUS COLLECTION RECOLLECT FOR CORRECT TIME    Potassium <2.0 (LL) 3.5 - 5.1 mmol/L    Comment: CRITICAL RESULT CALLED TO, READ BACK BY AND VERIFIED WITH: FERRAINOLO J,RN 07/30/21 0028 WAYK    Chloride 83 (L) 98 - 111 mmol/L   CO2 30 22 - 32 mmol/L   Glucose, Bld 180 (H) 70 - 99 mg/dL    Comment: Glucose reference range applies only to samples taken after fasting for at least 8 hours.   BUN 5 (L) 6 - 20 mg/dL   Creatinine, Ser 1.43 (H) 0.61 - 1.24 mg/dL   Calcium 5.6 (LL) 8.9 - 10.3 mg/dL    Comment: CRITICAL RESULT CALLED TO, READ BACK BY AND VERIFIED WITH: FERRAINOLO J,RN 07/30/21 0028 WAYK    GFR, Estimated 57 (L) >60 mL/min    Comment: (NOTE) Calculated using the CKD-EPI Creatinine Equation (2021)    Anion gap 15 5 - 15    Comment: Performed at [facility], [place] [address], [place], [place] [address]  Troponin I (High Sensitivity)     Status: Abnormal   Collection Time: 07/29/21 10:49 PM  Result Value Ref Range   Troponin I (High Sensitivity) 26 (H) <18 ng/L    Comment: (NOTE) Elevated high sensitivity troponin I (hsTnI) values and significant  changes across serial measurements may suggest ACS but many other  chronic and acute conditions are known to elevate hsTnI results.  Refer to the Links section for chest pain algorithms and additional  guidance. Performed at [facility], [place] [address] [place], [place] [address]   HIV Antibody (routine testing w rflx)     Status: None   Collection Time: 07/30/21  1:00 AM  Result Value Ref Range   HIV Screen 4th Generation wRfx Non Reactive Non Reactive    Comment: Performed at [facility], [place] [address], [place], [place] [address]  Ammonia     Status: Abnormal   Collection Time: 07/30/21  1:00 AM  Result Value Ref Range   Ammonia 49 (H) 9 - 35 umol/L    Comment: Performed at [facility], [place] [address], [place], [place]  [address]  Phosphorus     Status: None   Collection Time: 07/30/21  1:00 AM  Result Value Ref Range   Phosphorus 3.4 2.5 - 4.6 mg/dL    Comment: Performed at [place] [address] [place], [place] [address]  Basic metabolic panel     Status: Abnormal   Collection Time: 07/30/21  1:00 AM  Result Value Ref Range   Sodium 126 (L) 135 - 145 mmol/L   Potassium 2.3 (LL) 3.5 - 5.1 mmol/L    Comment: CRITICAL RESULT CALLED TO, READ BACK BY AND VERIFIED WITH: FERRAINOLO K,RN 07/30/21 0340 WAYK    Chloride 86 (L) 98 - 111 mmol/L   CO2 27 22 - 32 mmol/L   Glucose, Bld 168 (H) 70 - 99 mg/dL    Comment: Glucose reference range applies only to samples taken after fasting for at least 8 hours.   BUN 5 (L) 6 - 20 mg/dL  Creatinine, Ser 1.25 (H) 0.61 - 1.24 mg/dL   Calcium 5.6 (LL) 8.9 - 10.3 mg/dL    Comment: CRITICAL RESULT CALLED TO, READ BACK BY AND VERIFIED WITH: FERRAINOLO J,RN 07/30/21 0340 WAYK    GFR, Estimated >60 >60 mL/min    Comment: (NOTE) Calculated using the CKD-EPI Creatinine Equation (2021)    Anion gap 13 5 - 15    Comment: Performed at [facility] [address] [place], [place] [address]  Magnesium     Status: None   Collection Time: 07/30/21  6:26 AM  Result Value Ref Range   Magnesium 1.8 1.7 - 2.4 mg/dL    Comment: Performed at [facility] [address], [place], [place] [address]  Comprehensive metabolic panel     Status: Abnormal   Collection Time: 07/30/21  7:00 AM  Result Value Ref Range   Sodium 128 (L) 135 - 145 mmol/L   Potassium 2.9 (L) 3.5 - 5.1 mmol/L   Chloride 87 (L) 98 - 111 mmol/L   CO2 29 22 - 32 mmol/L   Glucose, Bld 135 (H) 70 - 99 mg/dL    Comment: Glucose reference range applies only to samples taken after fasting for at least 8 hours.   BUN 6 6 - 20 mg/dL   Creatinine, Ser 1.35 (H) 0.61 - 1.24 mg/dL   Calcium 5.6 (LL) 8.9 - 10.3 mg/dL    Comment: CRITICAL RESULT CALLED TO, READ BACK BY AND VERIFIED WITH: H DOOLEY  RN [id number] [phone] BY R VERAAR    Total Protein 5.7 (L) 6.5 - 8.1 g/dL   Albumin <1.5 (L) 3.5 - 5.0 g/dL   AST 68 (H) 15 - 41 U/L   ALT 45 (H) 0 - 44 U/L   Alkaline Phosphatase 185 (H) 38 - 126 U/L   Total Bilirubin 1.7 (H) 0.3 - 1.2 mg/dL   GFR, Estimated >60 >60 mL/min    Comment: (NOTE) Calculated using the CKD-EPI Creatinine Equation (2021)    Anion gap 12 5 - 15    Comment: Performed at [facility], [place] [address], [place], [place] [address]  C Difficile Quick Screen w PCR reflex     Status: Abnormal   Collection Time: 07/30/21  7:27 AM   Specimen: STOOL  Result Value Ref Range   C Diff antigen POSITIVE (A) NEGATIVE   C Diff toxin POSITIVE (A) NEGATIVE    Comment: RESULT CALLED TO, READ BACK BY AND VERIFIED WITH: RN J.BLUE AT 1308 ON 07/30/2021 BY T.SAAD.    C Diff interpretation Toxin producing C. difficile detected.     Comment: Performed at [facility], [place] [address], [place], [place] [address]  Urinalysis, Routine w reflex microscopic Urine, Clean Catch     Status: Abnormal   Collection Time: 07/30/21 10:00 AM  Result Value Ref Range   Color, Urine YELLOW YELLOW   APPearance HAZY (A) CLEAR   Specific Gravity, Urine 1.008 1.005 - 1.030   pH 5.0 5.0 - 8.0   Glucose, UA NEGATIVE NEGATIVE mg/dL   Hgb urine dipstick MODERATE (A) NEGATIVE   Bilirubin Urine NEGATIVE NEGATIVE   Ketones, ur NEGATIVE NEGATIVE mg/dL   Protein, ur NEGATIVE NEGATIVE mg/dL   Nitrite NEGATIVE NEGATIVE   Leukocytes,Ua NEGATIVE NEGATIVE   RBC / HPF 0-5 0 - 5 RBC/hpf   WBC, UA 0-5 0 - 5 WBC/hpf   Bacteria, UA NONE SEEN NONE SEEN   Squamous Epithelial / LPF 0-5 0 -  5    Comment: Performed at [facility], [place] [address], [place], [place] [address]  Rapid urine drug screen (hospital performed)     Status: None   Collection Time: 07/30/21 10:00 AM  Result Value Ref Range   Opiates NONE DETECTED NONE DETECTED   Cocaine NONE DETECTED NONE DETECTED   Benzodiazepines NONE DETECTED  NONE DETECTED   Amphetamines NONE DETECTED NONE DETECTED   Tetrahydrocannabinol NONE DETECTED NONE DETECTED   Barbiturates NONE DETECTED NONE DETECTED    Comment: (NOTE) DRUG SCREEN FOR MEDICAL PURPOSES ONLY.  IF CONFIRMATION IS NEEDED FOR ANY PURPOSE, NOTIFY LAB WITHIN 5 DAYS.  LOWEST DETECTABLE LIMITS FOR URINE DRUG SCREEN Drug Class                     Cutoff (ng/mL) Amphetamine and metabolites    1000 Barbiturate and metabolites    200 Benzodiazepine                 932 Tricyclics and metabolites     300 Opiates and metabolites        300 Cocaine and metabolites        300 THC                            50 Performed at [facility], [place] [address], [place], [place] [address]   CK     Status: Abnormal   Collection Time: 07/30/21 11:08 AM  Result Value Ref Range   Total CK 832 (H) 49 - 397 U/L    Comment: Performed at [facility], [place] [address], [place], [place] [address]  Magnesium     Status: None   Collection Time: 07/30/21 11:08 AM  Result Value Ref Range   Magnesium 1.8 1.7 - 2.4 mg/dL    Comment: Performed at [facility] [address], [place], [place] [address]  Basic metabolic panel     Status: Abnormal   Collection Time: 07/30/21 11:08 AM  Result Value Ref Range   Sodium 130 (L) 135 - 145 mmol/L   Potassium 2.6 (LL) 3.5 - 5.1 mmol/L    Comment: CRITICAL RESULT CALLED TO, READ BACK BY AND VERIFIED WITH: H DOOLEY RN [id number] BY R VERAAR    Chloride 91 (L) 98 - 111 mmol/L   CO2 28 22 - 32 mmol/L   Glucose, Bld 244 (H) 70 - 99 mg/dL    Comment: Glucose reference range applies only to samples taken after fasting for at least 8 hours.   BUN 5 (L) 6 - 20 mg/dL   Creatinine, Ser 1.41 (H) 0.61 - 1.24 mg/dL   Calcium 5.3 (LL) 8.9 - 10.3 mg/dL    Comment: CRITICAL RESULT CALLED TO, READ BACK BY AND VERIFIED WITH: H DOOLEY RN [id number] 1313 BY R VERAAR    GFR, Estimated 58 (L) >60 mL/min    Comment: (NOTE) Calculated using the CKD-EPI  Creatinine Equation (2021)    Anion gap 11 5 - 15    Comment: Performed at [facility] [address] [place],  [address]  CBG monitoring, ED     Status: Abnormal   Collection Time: 07/30/21  1:19 PM  Result Value Ref Range   Glucose-Capillary 272 (H) 70 - 99 mg/dL    Comment: Glucose reference range applies only to samples taken after fasting for at least 8 hours.   Comment  1 Notify RN    Comment 2 Document in Chart   Culture, blood (routine x 2)     Status: None (Preliminary result)   Collection Time: 07/30/21  5:04 PM   Specimen: BLOOD  Result Value Ref Range   Specimen Description BLOOD RIGHT ANTECUBITAL    Special Requests      BOTTLES DRAWN AEROBIC AND ANAEROBIC Blood Culture adequate volume   Culture  Setup Time      GRAM POSITIVE COCCI IN CLUSTERS IN BOTH AEROBIC AND ANAEROBIC BOTTLES CRITICAL RESULT CALLED TO, READ BACK BY AND VERIFIED WITH: PHARMD A.PAYTES AT 4431 ON 07/31/2021 BY T.SAAD. Performed at [facility], [place] [address] [name], [place] [address]    Culture GRAM POSITIVE COCCI    Report Status PENDING   Blood Culture ID Panel (Reflexed)     Status: Abnormal   Collection Time: 07/30/21  5:04 PM  Result Value Ref Range   Enterococcus faecalis NOT DETECTED NOT DETECTED   Enterococcus Faecium NOT DETECTED NOT DETECTED   Listeria monocytogenes NOT DETECTED NOT DETECTED   Staphylococcus species DETECTED (A) NOT DETECTED    Comment: CRITICAL RESULT CALLED TO, READ BACK BY AND VERIFIED WITH: PHARMD A.PAYTES AT 6761 ON 07/31/2021 BY T.SAAD.    Staphylococcus aureus (BCID) NOT DETECTED NOT DETECTED   Staphylococcus epidermidis NOT DETECTED NOT DETECTED   Staphylococcus lugdunensis NOT DETECTED NOT DETECTED   Streptococcus species NOT DETECTED NOT DETECTED   Streptococcus agalactiae NOT DETECTED NOT DETECTED   Streptococcus pneumoniae NOT DETECTED NOT DETECTED   Streptococcus pyogenes NOT DETECTED NOT DETECTED   A.calcoaceticus-baumannii NOT  DETECTED NOT DETECTED   Bacteroides fragilis NOT DETECTED NOT DETECTED   Enterobacterales NOT DETECTED NOT DETECTED   Enterobacter cloacae complex NOT DETECTED NOT DETECTED   Escherichia coli NOT DETECTED NOT DETECTED   Klebsiella aerogenes NOT DETECTED NOT DETECTED   Klebsiella oxytoca NOT DETECTED NOT DETECTED   Klebsiella pneumoniae NOT DETECTED NOT DETECTED   Proteus species NOT DETECTED NOT DETECTED   Salmonella species NOT DETECTED NOT DETECTED   Serratia marcescens NOT DETECTED NOT DETECTED   Haemophilus influenzae NOT DETECTED NOT DETECTED   Neisseria meningitidis NOT DETECTED NOT DETECTED   Pseudomonas aeruginosa NOT DETECTED NOT DETECTED   Stenotrophomonas maltophilia NOT DETECTED NOT DETECTED   Candida albicans NOT DETECTED NOT DETECTED   Candida auris NOT DETECTED NOT DETECTED   Candida glabrata NOT DETECTED NOT DETECTED   Candida krusei NOT DETECTED NOT DETECTED   Candida parapsilosis NOT DETECTED NOT DETECTED   Candida tropicalis NOT DETECTED NOT DETECTED   Cryptococcus neoformans/gattii NOT DETECTED NOT DETECTED    Comment: Performed at [facility], [address], [place], [place] [address]  CBG monitoring, ED     Status: Abnormal   Collection Time: 07/30/21  5:18 PM  Result Value Ref Range   Glucose-Capillary 325 (H) 70 - 99 mg/dL    Comment: Glucose reference range applies only to samples taken after fasting for at least 8 hours.   Comment 1 Notify RN    Comment 2 Document in Chart   Magnesium     Status: Abnormal   Collection Time: 07/30/21  7:00 PM  Result Value Ref Range   Magnesium 1.1 (L) 1.7 - 2.4 mg/dL    Comment: Performed at [facility], [place] [address], [place], [place] [address]  Basic metabolic panel     Status: Abnormal   Collection Time: 07/30/21  7:00 PM  Result Value  Ref Range   Sodium 134 (L) 135 - 145 mmol/L   Potassium 2.5 (LL) 3.5 - 5.1 mmol/L    Comment: CRITICAL RESULT CALLED TO, READ BACK BY AND VERIFIED WITH: S NEWTON RN  BY SSTEPHENS [id number]    Chloride 108 98 - 111 mmol/L   CO2 18 (L) 22 - 32 mmol/L   Glucose, Bld 259 (H) 70 - 99 mg/dL    Comment: Glucose reference range applies only to samples taken after fasting for at least 8 hours.   BUN 5 (L) 6 - 20 mg/dL   Creatinine, Ser 1.04 0.61 - 1.24 mg/dL   Calcium <4.0 (LL) 8.9 - 10.3 mg/dL    Comment: CRITICAL RESULT CALLED TO, READ BACK BY AND VERIFIED WITH: S NEWTON RN BY SSTEPHENS [id number]    GFR, Estimated >60 >60 mL/min    Comment: (NOTE) Calculated using the CKD-EPI Creatinine Equation (2021)    Anion gap 8 5 - 15    Comment: Performed at [facility] [address], [place], [place] [address]  Magnesium     Status: Abnormal   Collection Time: 07/30/21 11:00 PM  Result Value Ref Range   Magnesium 2.6 (H) 1.7 - 2.4 mg/dL    Comment: Performed at [facility] [address] [place], [place] [address]  Basic metabolic panel     Status: Abnormal   Collection Time: 07/30/21 11:00 PM  Result Value Ref Range   Sodium 126 (L) 135 - 145 mmol/L   Potassium 4.9 3.5 - 5.1 mmol/L   Chloride 95 (L) 98 - 111 mmol/L   CO2 25 22 - 32 mmol/L   Glucose, Bld 422 (H) 70 - 99 mg/dL    Comment: Glucose reference range applies only to samples taken after fasting for at least 8 hours.   BUN <5 (L) 6 - 20 mg/dL   Creatinine, Ser 1.42 (H) 0.61 - 1.24 mg/dL   Calcium 5.6 (LL) 8.9 - 10.3 mg/dL    Comment: CRITICAL RESULT CALLED TO, READ BACK BY AND VERIFIED WITH: NEWTON J,RN 07/31/21 0045 WAYK    GFR, Estimated 58 (L) >60 mL/min    Comment: (NOTE) Calculated using the CKD-EPI Creatinine Equation (2021)    Anion gap 6 5 - 15    Comment: Performed at [facility], [place] [address], [place], [place] [address]  CBG monitoring, ED     Status: Abnormal   Collection Time: 07/30/21 11:40 PM  Result Value Ref Range   Glucose-Capillary 401 (H) 70 - 99 mg/dL    Comment: Glucose reference range applies only to samples taken after fasting for at  least 8 hours.  Basic metabolic panel     Status: Abnormal   Collection Time: 07/31/21  1:58 AM  Result Value Ref Range   Sodium 127 (L) 135 - 145 mmol/L   Potassium 5.0 3.5 - 5.1 mmol/L   Chloride 97 (L) 98 - 111 mmol/L   CO2 24 22 - 32 mmol/L   Glucose, Bld 438 (H) 70 - 99 mg/dL    Comment: Glucose reference range applies only to samples taken after fasting for at least 8 hours.   BUN 5 (L) 6 - 20 mg/dL   Creatinine, Ser 1.37 (H) 0.61 - 1.24 mg/dL   Calcium 5.6 (LL) 8.9 - 10.3 mg/dL    Comment: CRITICAL RESULT CALLED TO, READ BACK BY AND VERIFIED WITH: NEWTON J,RN 07/31/21 0313 WAYK    GFR, Estimated >60 >60 mL/min  Comment: (NOTE) Calculated using the CKD-EPI Creatinine Equation (2021)    Anion gap 6 5 - 15    Comment: Performed at [facility], [place] [address] [place], [place] [address]  CBG monitoring, ED     Status: Abnormal   Collection Time: 07/31/21  2:05 AM  Result Value Ref Range   Glucose-Capillary 426 (H) 70 - 99 mg/dL    Comment: Glucose reference range applies only to samples taken after fasting for at least 8 hours.  CBG monitoring, ED     Status: Abnormal   Collection Time: 07/31/21  5:41 AM  Result Value Ref Range   Glucose-Capillary 416 (H) 70 - 99 mg/dL    Comment: Glucose reference range applies only to samples taken after fasting for at least 8 hours.  Magnesium     Status: None   Collection Time: 07/31/21  5:42 AM  Result Value Ref Range   Magnesium 2.2 1.7 - 2.4 mg/dL    Comment: Performed at [facility] [address], [place], [place] [address]  CBC with Differential/Platelet     Status: Abnormal   Collection Time: 07/31/21  5:42 AM  Result Value Ref Range   WBC 6.8 4.0 - 10.5 K/uL   RBC 3.15 (L) 4.22 - 5.81 MIL/uL   Hemoglobin 9.8 (L) 13.0 - 17.0 g/dL   HCT 27.3 (L) 39.0 - 52.0 %   MCV 86.7 80.0 - 100.0 fL   MCH 31.1 26.0 - 34.0 pg   MCHC 35.9 30.0 - 36.0 g/dL   RDW 19.1 (H) 11.5 - 15.5 %   Platelets 166 150 - 400 K/uL   nRBC  0.0 0.0 - 0.2 %   Neutrophils Relative % 73 %   Neutro Abs 4.9 1.7 - 7.7 K/uL   Lymphocytes Relative 17 %   Lymphs Abs 1.2 0.7 - 4.0 K/uL   Monocytes Relative 9 %   Monocytes Absolute 0.6 0.1 - 1.0 K/uL   Eosinophils Relative 0 %   Eosinophils Absolute 0.0 0.0 - 0.5 K/uL   Basophils Relative 0 %   Basophils Absolute 0.0 0.0 - 0.1 K/uL   Immature Granulocytes 1 %   Abs Immature Granulocytes 0.04 0.00 - 0.07 K/uL    Comment: Performed at [facility] [address] [place], [place] [address]  Comprehensive metabolic panel     Status: Abnormal   Collection Time: 07/31/21  5:42 AM  Result Value Ref Range   Sodium 127 (L) 135 - 145 mmol/L   Potassium 5.1 3.5 - 5.1 mmol/L   Chloride 98 98 - 111 mmol/L   CO2 24 22 - 32 mmol/L   Glucose, Bld 430 (H) 70 - 99 mg/dL    Comment: Glucose reference range applies only to samples taken after fasting for at least 8 hours.   BUN 5 (L) 6 - 20 mg/dL   Creatinine, Ser 1.40 (H) 0.61 - 1.24 mg/dL   Calcium 5.6 (LL) 8.9 - 10.3 mg/dL    Comment: CRITICAL RESULT CALLED TO, READ BACK BY AND VERIFIED WITH: J.NEWTON,RN 0645 07/31/21 CLARK,S    Total Protein 5.1 (L) 6.5 - 8.1 g/dL   Albumin <1.5 (L) 3.5 - 5.0 g/dL   AST 47 (H) 15 - 41 U/L   ALT 42 0 - 44 U/L   Alkaline Phosphatase 168 (H) 38 - 126 U/L   Total Bilirubin 1.5 (H) 0.3 - 1.2 mg/dL   GFR, Estimated 59 (L) >60 mL/min    Comment: (  NOTE) Calculated using the CKD-EPI Creatinine Equation (2021)    Anion gap 5 5 - 15    Comment: Performed at [facility], [place] [address] [place], [place] [address]  Hemoglobin A1c     Status: Abnormal   Collection Time: 07/31/21  5:42 AM  Result Value Ref Range   Hgb A1c MFr Bld 5.7 (H) 4.8 - 5.6 %    Comment: (NOTE) Pre diabetes:          5.7%-6.4%  Diabetes:              >6.4%  Glycemic control for   <7.0% adults with diabetes    Mean Plasma Glucose 116.89 mg/dL    Comment: Performed at [facility] [address], [place],  [place] [address]  CBG monitoring, ED     Status: Abnormal   Collection Time: 07/31/21  9:32 AM  Result Value Ref Range   Glucose-Capillary 323 (H) 70 - 99 mg/dL    Comment: Glucose reference range applies only to samples taken after fasting for at least 8 hours.  Magnesium     Status: Abnormal   Collection Time: 07/31/21  9:34 AM  Result Value Ref Range   Magnesium 1.5 (L) 1.7 - 2.4 mg/dL    Comment: Performed at [facility] [address] [place], [place] [address]  Basic metabolic panel     Status: Abnormal   Collection Time: 07/31/21  9:34 AM  Result Value Ref Range   Sodium 135 135 - 145 mmol/L   Potassium 3.1 (L) 3.5 - 5.1 mmol/L   Chloride 112 (H) 98 - 111 mmol/L   CO2 16 (L) 22 - 32 mmol/L   Glucose, Bld 237 (H) 70 - 99 mg/dL    Comment: Glucose reference range applies only to samples taken after fasting for at least 8 hours.   BUN <5 (L) 6 - 20 mg/dL   Creatinine, Ser 0.94 0.61 - 1.24 mg/dL   Calcium <4.0 (LL) 8.9 - 10.3 mg/dL    Comment: CRITICAL RESULT CALLED TO, READ BACK BY AND VERIFIED WITH: A Auston Halfmann RN 07/31/21 1050 BY R VERAAR    GFR, Estimated >60 >60 mL/min    Comment: (NOTE) Calculated using the CKD-EPI Creatinine Equation (2021)    Anion gap 7 5 - 15    Comment: Performed at [facility] [address] [place], [place] [address]   DG Chest 1 View  Result Date: 07/29/2021 CLINICAL DATA:  Weakness. EXAM: CHEST  1 VIEW COMPARISON:  12/17/2020 FINDINGS: The cardiomediastinal contours are normal. The lungs are clear. Pulmonary vasculature is normal. No consolidation, pleural effusion, or pneumothorax. No acute osseous abnormalities are seen. Remote left lateral lower rib fracture IMPRESSION: No acute chest findings. Electronically Signed   By: Keith Rake M.D.   On: 07/29/2021 23:19   CT HEAD WO CONTRAST (5MM)  Result Date: 07/29/2021 CLINICAL DATA:  Dizziness EXAM: CT HEAD WITHOUT CONTRAST TECHNIQUE: Contiguous axial images were obtained from  the base of the skull through the vertex without intravenous contrast. COMPARISON:  11/18/2020 FINDINGS: Brain: Age advanced cerebral and cerebellar atrophy. No acute intracranial abnormality. Specifically, no hemorrhage, hydrocephalus, mass lesion, acute infarction, or significant intracranial injury. Vascular: No hyperdense vessel or unexpected calcification. Skull: No acute calvarial abnormality. Sinuses/Orbits: No acute findings Other: None IMPRESSION: Age advanced atrophy.  No acute intracranial abnormality. Electronically Signed   By: Rolm Baptise M.D.   On: 07/29/2021 21:30   EEG  adult  Result Date: 07/30/2021 Lora Havens, MD     07/30/2021  4:24 PM Patient Name: Jeffrey [name] MRN: [id number] Epilepsy Attending: Lora Havens Referring Physician/Provider: Dr [name] Date: 07/30/2021 Duration: 22.39 mins Patient history: 55 year old male was syncopal event with unresponsiveness for 2 to 3 minutes as well as bowel incontinence.  EEG to evaluate for seizure. Level of alertness: Awake AEDs during EEG study: None Technical aspects: This EEG study was done with scalp electrodes positioned according to the 10-20 International system of electrode placement. Electrical activity was acquired at a sampling rate of 500Hz  and reviewed with a high frequency filter of 70Hz  and a low frequency filter of 1Hz . EEG data were recorded continuously and digitally stored. Description: The posterior dominant rhythm consists of 8Hz  activity of moderate voltage (25-35 uV) seen predominantly in posterior head regions, symmetric and reactive to eye opening and eye closing. Hyperventilation and photic stimulation were not performed.   IMPRESSION: This study is within normal limits. No seizures or epileptiform discharges were seen throughout the recording. Lora Havens   ECHOCARDIOGRAM COMPLETE  Result Date: 07/30/2021    ECHOCARDIOGRAM REPORT   Patient Name:   Jeffrey [name] [name] Date of Exam: 07/30/2021 Medical  Rec #:  [id number]        Height:       70.0 in Accession #:    [id number]       Weight:       248.0 lb Date of Birth:  10-30-1965        BSA:          2.287 m Patient Age:    44 years         BP:           114/78 mmHg Patient Gender: M                HR:           96 bpm. Exam Location:  Inpatient Procedure: 2D Echo, Cardiac Doppler and Color Doppler Indications:    Syncope  History:        Patient has prior history of Echocardiogram examinations, most                 recent 11/18/2020. Arrythmias:Cardiac Arrest,                 Signs/Symptoms:Syncope; Risk Factors:Hypertension, Diabetes,                 Obesity and Sleep Apnea. Seizures, AKI, Alcohol abuse,                 Cirrhosis.  Sonographer:    Dustin Flock RDCS Referring Phys: [id number] CARINA M BROWN IMPRESSIONS  1. Left ventricular ejection fraction, by estimation, is 65 to 70%. The left ventricle has normal function. The left ventricle has no regional wall motion abnormalities. There is mild concentric left ventricular hypertrophy. Left ventricular diastolic parameters were normal.  2. Right ventricular systolic function is normal. The right ventricular size is normal. There is normal pulmonary artery systolic pressure.  3. The mitral valve is normal in structure. Trivial mitral valve regurgitation.  4. The aortic valve is normal in structure. Aortic valve regurgitation is not visualized. FINDINGS  Left Ventricle: Left ventricular ejection fraction, by estimation, is 65 to 70%. The left ventricle has normal function. The left ventricle has no regional wall motion abnormalities. The left ventricular internal cavity size was normal in size. There is  mild concentric  left ventricular hypertrophy. Left ventricular diastolic parameters were normal. Right Ventricle: The right ventricular size is normal. Right vetricular wall thickness was not well visualized. Right ventricular systolic function is normal. There is normal pulmonary artery systolic pressure.  The tricuspid regurgitant velocity is 2.56 m/s, and with an assumed right atrial pressure of 3 mmHg, the estimated right ventricular systolic pressure is 24.4 mmHg. Left Atrium: Left atrial size was normal in size. Right Atrium: Right atrial size was normal in size. Pericardium: There is no evidence of pericardial effusion. Mitral Valve: The mitral valve is normal in structure. Trivial mitral valve regurgitation. Tricuspid Valve: The tricuspid valve is grossly normal. Tricuspid valve regurgitation is trivial. Aortic Valve: The aortic valve is normal in structure. Aortic valve regurgitation is not visualized. Pulmonic Valve: The pulmonic valve was grossly normal. Pulmonic valve regurgitation is not visualized. Aorta: The aortic root and ascending aorta are structurally normal, with no evidence of dilitation. IAS/Shunts: The atrial septum is grossly normal.  LEFT VENTRICLE PLAX 2D LVIDd:         3.80 cm   Diastology LVIDs:         2.20 cm   LV e' medial:    5.98 cm/s LV PW:         1.20 cm   LV E/e' medial:  17.1 LV IVS:        1.20 cm   LV e' lateral:   8.05 cm/s LVOT diam:     2.30 cm   LV E/e' lateral: 12.7 LV SV:         116 LV SV Index:   51 LVOT Area:     4.15 cm  RIGHT VENTRICLE RV Basal diam:  2.40 cm TAPSE (M-mode): 2.0 cm LEFT ATRIUM             Index        RIGHT ATRIUM           Index LA diam:        2.40 cm 1.05 cm/m   RA Area:     10.50 cm LA Vol (A2C):   26.7 ml 11.67 ml/m  RA Volume:   21.10 ml  9.23 ml/m LA Vol (A4C):   49.5 ml 21.64 ml/m LA Biplane Vol: 39.8 ml 17.40 ml/m  AORTIC VALVE LVOT Vmax:   130.00 cm/s LVOT Vmean:  96.100 cm/s LVOT VTI:    0.279 m  AORTA Ao Root diam: 2.80 cm MITRAL VALVE                TRICUSPID VALVE MV Area (PHT): 7.16 cm     TR Peak grad:   26.2 mmHg MV Decel Time: 106 msec     TR Vmax:        256.00 cm/s MV E velocity: 102.00 cm/s MV A velocity: 88.10 cm/s   SHUNTS MV E/A ratio:  1.16         Systemic VTI:  0.28 m                             Systemic Diam: 2.30  cm Mertie Moores MD Electronically signed by Mertie Moores MD Signature Date/Time: 07/30/2021/2:13:22 PM    Final     Pending Labs Unresulted Labs (From admission, onward)     Start     Ordered   07/31/21 0102  Basic metabolic panel  Now then every 8 hours,   R      07/31/21  1025   07/31/21 1023  CK  Once,   R        07/31/21 1025   07/30/21 1202  Culture, blood (routine x 2)  BLOOD CULTURE X 2,   R      07/30/21 1202   07/30/21 1000  Calcium, ionized  Once,   R        07/30/21 0744   07/30/21 0333  Levetiracetam level  Once,   R        07/30/21 0332            Vitals/Pain Today's Vitals   07/31/21 0700 07/31/21 1000 07/31/21 1023 07/31/21 1100  BP: 107/82 (!) 122/100  (!) 150/105  Pulse: 82 85  84  Resp: 15 13  13   Temp:      TempSrc:      SpO2: 100% 100%  100%  Weight:      Height:      PainSc:   0-No pain     Isolation Precautions Enteric precautions (UV disinfection)  Medications Medications  enoxaparin (LOVENOX) injection 40 mg (40 mg Subcutaneous Given 07/31/21 0929)  thiamine tablet 100 mg (100 mg Oral Given 07/31/21 0927)  multivitamin with minerals tablet 1 tablet (1 tablet Oral Given 36/14/43 1540)  folic acid (FOLVITE) tablet 1 mg (1 mg Oral Given 07/31/21 0927)  aspirin EC tablet 81 mg (81 mg Oral Given 07/30/21 2213)  atorvastatin (LIPITOR) tablet 80 mg (80 mg Oral Given 07/30/21 2213)  levETIRAcetam (KEPPRA) tablet 1,000 mg (1,000 mg Oral Given 07/31/21 0926)  feeding supplement (BOOST / RESOURCE BREEZE) liquid 1 Container (1 Container Oral Not Given 07/31/21 1013)  magnesium sulfate IVPB 2 g 50 mL (0 g Intravenous Stopped 07/31/21 1022)  fidaxomicin (DIFICID) tablet 200 mg (200 mg Oral Given 07/31/21 0927)  famotidine (PEPCID) tablet 20 mg (20 mg Oral Given 07/31/21 0927)  0.9 %  sodium chloride infusion ( Intravenous New Bag/Given 07/30/21 2211)  potassium chloride 10 mEq in 100 mL IVPB (0 mEq Intravenous Hold 07/31/21 0343)  gabapentin  (NEURONTIN) capsule 100 mg (100 mg Oral Given 07/31/21 1108)  insulin aspart (novoLOG) injection 0-9 Units (has no administration in time range)  potassium chloride SA (KLOR-CON) CR tablet 40 mEq (has no administration in time range)  magnesium sulfate IVPB 2 g 50 mL (0 g Intravenous Stopped 07/29/21 2120)  potassium chloride 10 mEq in 100 mL IVPB (0 mEq Intravenous Stopped 07/29/21 2143)  potassium chloride SA (KLOR-CON) CR tablet 40 mEq (40 mEq Oral Given 07/30/21 0316)  magnesium sulfate IVPB 4 g 100 mL (0 g Intravenous Stopped 07/30/21 0003)  potassium chloride 10 mEq in 100 mL IVPB (0 mEq Intravenous Stopped 07/29/21 2320)  calcium carbonate (TUMS - dosed in mg elemental calcium) chewable tablet 400 mg of elemental calcium (400 mg of elemental calcium Oral Given 07/30/21 2212)  potassium chloride SA (KLOR-CON) CR tablet 40 mEq (40 mEq Oral Given 07/30/21 1007)  potassium chloride SA (KLOR-CON) CR tablet 60 mEq (60 mEq Oral Given 07/30/21 1910)  calcium gluconate 1 g/ 50 mL sodium chloride IVPB (0 mg Intravenous Stopped 07/30/21 1610)  potassium chloride 10 mEq in 100 mL IVPB (0 mEq Intravenous Stopped 07/30/21 2144)  calcium gluconate 2 g/ 100 mL sodium chloride IVPB (0 mg Intravenous Stopped 07/30/21 2144)  magnesium sulfate IVPB 4 g 100 mL (0 g Intravenous Stopped 07/30/21 2318)  insulin aspart (novoLOG) injection 5 Units (5 Units Subcutaneous Given 07/31/21 0520)  calcium gluconate 2 g/ 100 mL  sodium chloride IVPB (0 mg Intravenous Stopped 07/31/21 1122)    Mobility walks with device Moderate fall risk   Focused Assessments Abdominal/GI   R Recommendations: See Admitting Provider Note  Report given to: Everlena Cooper, RN  Additional Notes:

## 2021-08-01 DIAGNOSIS — A0472 Enterocolitis due to Clostridium difficile, not specified as recurrent: Principal | ICD-10-CM

## 2021-08-01 DIAGNOSIS — R296 Repeated falls: Secondary | ICD-10-CM

## 2021-08-01 LAB — GLUCOSE, CAPILLARY
Glucose-Capillary: 174 mg/dL — ABNORMAL HIGH (ref 70–99)
Glucose-Capillary: 358 mg/dL — ABNORMAL HIGH (ref 70–99)
Glucose-Capillary: 272 mg/dL — ABNORMAL HIGH (ref 70–99)
Glucose-Capillary: 195 mg/dL — ABNORMAL HIGH (ref 70–99)

## 2021-08-01 LAB — CBC
HCT: 31.2 % — ABNORMAL LOW (ref 39.0–52.0)
Hemoglobin: 10.8 g/dL — ABNORMAL LOW (ref 13.0–17.0)
MCH: 30.9 pg (ref 26.0–34.0)
MCHC: 34.6 g/dL (ref 30.0–36.0)
MCV: 89.4 fL (ref 80.0–100.0)
Platelets: 182 10*3/uL (ref 150–400)
RBC: 3.49 MIL/uL — ABNORMAL LOW (ref 4.22–5.81)
RDW: 19.2 % — ABNORMAL HIGH (ref 11.5–15.5)
WBC: 8.9 10*3/uL (ref 4.0–10.5)
nRBC: 0 % (ref 0.0–0.2)

## 2021-08-01 LAB — BASIC METABOLIC PANEL
Anion gap: 5 (ref 5–15)
BUN: 6 mg/dL (ref 6–20)
CO2: 26 mmol/L (ref 22–32)
Calcium: 6.5 mg/dL — ABNORMAL LOW (ref 8.9–10.3)
Chloride: 101 mmol/L (ref 98–111)
Creatinine, Ser: 1.1 mg/dL (ref 0.61–1.24)
GFR, Estimated: >60 mL/min (ref >60)
Glucose, Bld: 205 mg/dL — ABNORMAL HIGH (ref 70–99)
Potassium: 4.8 mmol/L (ref 3.5–5.1)
Sodium: 132 mmol/L — ABNORMAL LOW (ref 135–145)

## 2021-08-01 LAB — HEPATIC FUNCTION PANEL
ALT: 47 U/L — ABNORMAL HIGH (ref 0–44)
AST: 41 U/L (ref 15–41)
Albumin: <1.5 g/dL — ABNORMAL LOW (ref 3.5–5.0)
Alkaline Phosphatase: 201 U/L — ABNORMAL HIGH (ref 38–126)
Bilirubin, Direct: 0.4 mg/dL — ABNORMAL HIGH (ref 0.0–0.2)
Indirect Bilirubin: 0.8 mg/dL (ref 0.3–0.9)
Total Bilirubin: 1.2 mg/dL (ref 0.3–1.2)
Total Protein: 5.9 g/dL — ABNORMAL LOW (ref 6.5–8.1)

## 2021-08-01 MED ORDER — CALCIUM GLUCONATE-NACL 2-0.675 GM/100ML-% IV SOLN: 2.0000 g | Freq: Once | INTRAVENOUS | Status: DC

## 2021-08-01 MED ORDER — LORAZEPAM 1 MG PO TABS
1.0000 mg | ORAL_TABLET | Freq: Three times a day (TID) | ORAL | Status: DC
  Administered 2021-08-01: 1 mg via ORAL
  Filled 2021-08-01 (×2): qty 1

## 2021-08-01 MED ORDER — LORAZEPAM 1 MG PO TABS: 0.0000 mg | ORAL_TABLET | ORAL | Status: DC

## 2021-08-01 MED ORDER — CALCIUM GLUCONATE-NACL 2-0.675 GM/100ML-% IV SOLN
2.0000 g | Freq: Once | INTRAVENOUS | Status: AC
  Administered 2021-08-01: 2000 mg via INTRAVENOUS
  Filled 2021-08-01: qty 100

## 2021-08-01 MED ORDER — LORAZEPAM 2 MG/ML IJ SOLN
1.0000 mg | INTRAMUSCULAR | Status: DC | PRN
  Administered 2021-08-02 – 2021-08-03 (×2): 2 mg via INTRAVENOUS
  Filled 2021-08-01 (×2): qty 1

## 2021-08-01 MED ORDER — LORAZEPAM 1 MG PO TABS
1.0000 mg | ORAL_TABLET | Freq: Once | ORAL | Status: AC
  Administered 2021-08-01: 1 mg via ORAL
  Filled 2021-08-01: qty 1

## 2021-08-01 MED ORDER — LORAZEPAM 1 MG PO TABS: 1.0000 mg | ORAL_TABLET | ORAL | Status: DC | PRN

## 2021-08-01 MED ORDER — LORAZEPAM 1 MG PO TABS: 0.0000 mg | ORAL_TABLET | Freq: Three times a day (TID) | ORAL | Status: DC

## 2021-08-01 NOTE — Progress Notes (Signed)
Initial Nutrition Assessment  DOCUMENTATION CODES:   Not applicable  INTERVENTION:   Continue Multivitamin w/ minerals daily  Encourage good PO intake  NUTRITION DIAGNOSIS:   Increased nutrient needs related to chronic illness (Cirrhosis) as evidenced by estimated needs.  GOAL:   Patient will meet greater than or equal to 90% of their needs  MONITOR:   PO intake, Weight trends, Supplement acceptance, I & O's  REASON FOR ASSESSMENT:   Consult Assessment of nutrition requirement/status  ASSESSMENT:   55 y.o. male presented ot the ED after a fall. PMH includes EtOH abuse, cirrhosis, T2DM, anoxic brain injury, and CKD IIIa. Pt admitted with syncope, AKI, and C.Diff +.   Pt sitting in chair at bedside, eating lunch. Pt seemed to be a bit confused when PLDN was in the room.   Pt reports that he has no appetite at all and not eating well. Reports that he eats nothing for breakfast, lunch, and dinner. When asked how long this has been going on for, pt reports 6-7 years. Pt does admit to EtOH use, states that he has one 42 oz beer per day, and takes him 8-10 hours to complete. Pt states that he lives at home with his son and daughter and that they help with the cooking. Pt then stated "cook for themselves." Pt reports that now that he is here and being helped, he finally has an appetite. PLDN observed 100% meal completion of lunch. Per RN, pt had 2 breakfast trays and that he completed both.  Pt reports UBW 199# year ago, thinks still here. When asked if he has noticed any weight loss, stated "dramatically." Per EMR, pt has had 7% weight loss in 7 months. Reports that he has been using a cane since January. Pt reports that he has not been working.   Suspect pt to have a degree of malnutrition due to EtOH abuse.   Medications reviewed and include: Pepcid, Dificid, Folic acid, SSI 0-9 units TID, Semglee 10 units daily, MVI, Thiamine Labs reviewed: Sodium 132, Calcium 6.5, 24 hr BG  trends 174-350, Magnesium 2.2  NUTRITION - FOCUSED PHYSICAL EXAM:  Pt refused at this time.   Diet Order:   Diet Order             Diet Carb Modified Fluid consistency: Thin; Room service appropriate? Yes  Diet effective now                   EDUCATION NEEDS:   No education needs have been identified at this time  Skin:  Skin Assessment: Reviewed RN Assessment  Last BM:  07/31/2021  Height:   Ht Readings from Last 1 Encounters:  07/29/21 5\' 10"  (1.778 m)    Weight:   Wt Readings from Last 1 Encounters:  07/29/21 112.5 kg    Ideal Body Weight:  75.5 kg  BMI:  Body mass index is 35.58 kg/m.  Estimated Nutritional Needs:   Kcal:  1638-4665  Protein:  115-130 grams  Fluid:  >/= 2 L    Mikell Camp BS, PLDN Clinical Dietitian See AMiON for contact information.

## 2021-08-01 NOTE — TOC Progression Note (Signed)
Transition of Care [facility]) - Progression Note    Patient Details  Name: Jeffrey [name] MRN: [id number] Date of Birth: 17-May-1966  [facility] CM/SW [place], LCSW Phone Number: 08/01/2021, 4:22 PM  Clinical Narrative:    CSW received consult regarding safety concerns for patient's children who are reportedly at home alone.  At the point when CSW and MSW Intern went to speak with patient he was in the hallway attempting to leave AMA. However, he was not able to ambulate long distances and rested in a wheelchair. Patient appeared very agitated, wanting the RN to assist him in leaving the hospital. He stated his neighbor, Zigmund Daniel, was downstairs waiting on him. CSW attempted to elicit why patient wanted to leave the hospital and he stated he had to go see his kids, further stating that they are in their thirties and receive SSI and are not able to do anything for themselves. He stated their mother passed away in 2022/12/24 and he is their Stepfather and has had guardianship of them since. He would not allow CSW to call to check on them or call Zigmund Daniel. He continued to be verbally abusive and attempted to ambulate off the unit with much difficulty and had to lean against the wall with his cane. RN Director and MD attempted to deescalate situation and convince patient to stay. Patient finally agreed to return to his room to see if his kids could come up to the hospital.   CSW met Zigmund Daniel outside where she was on the phone with patient and made her aware that patient was agreeing to stay at the hospital. CSW sought clarification regarding the safety of his children. She stated they were safe and that patient "was lying". She stated the kids are 28,29,30 and are at home with their Aunt. She stated they are "special" but not incapacitated and she just dropped food off to them as well. CSW introduced idea of bringing them to the hospital to visit but she declined, stating that patient can talk to  them on the phone as he has been doing. She thanked CSW for letting her know that patient was staying.  A few minutes later, CSW observed two visitors going into patient's room. At this time, CSW has no reportable concerns about patient's children as they are being cared for by other adults.       Expected Discharge Plan: OP Rehab Barriers to Discharge: Continued Medical Work up  Expected Discharge Plan and Services Expected Discharge Plan: OP Rehab   Discharge Planning Services: CM Consult   Living arrangements for the past 2 months: Single Family Home                                       Social Determinants of Health (SDOH) Interventions    Readmission Risk Interventions Readmission Risk Prevention Plan 08/01/2021  Transportation Screening Complete  PCP or Specialist Appt within 5-7 Days Complete  Home Care Screening Patient refused  Medication Review (RN CM) Complete  Some recent data might be hidden

## 2021-08-01 NOTE — Progress Notes (Addendum)
FMTS Attending Daily Note: [name], MD  Team Pager [phone] Pager [phone]  I have seen and examined this patient, reviewed their chart. I have discussed this patient with the resident physician.  Please edits within note. I agree with the remainder of the findings, exam, and plan below.   Disposition: Possibly home 10/27 pending stable electrolytes and clinical stability.  We will also need to clarify coverage of p.o. antibiotic regimen of the fidaxomicin.   Family Medicine Teaching Service Daily Progress Note Intern Pager: [phone]  Patient name: Jeffrey [name] Medical record number: [id number] Date of birth: 1966-01-10 Age: 55 y.o. Gender: male  Primary Care Provider: [name], MD Consultants: None Code Status: Full  Pt Overview and Major Events to Date:  10/23- Admitted, C dif antigen/toxin +  Assessment and Plan: Jeffrey [name] is a 55 year old male who presented after a fall with multiple electrolyte abnormalities, now resolved after repletion, on Fidaxomicin day #3 of 10 for C. Difficile. PMH significant for alcohol use disorder, erosive esophagitis, seizure disorder, PEA with cardiac arrest in 2014 requiring prolonged intubation.  Nonfulminant nonsevere clostridium difficile infection Remains afebrile (97.6 F) and normotensive. On day #3 of 10 Fidaxomicin. Diarrhea frequency is improving. Unfortunately, patient's insurance denied prior authorization for Fidaxomicin and requires ID consult and 10 day trial of Vancomycin management failure before covering. Will consult with ID about best plan of action regarding this issue. Abdomen soft, non-tender to palpation. Appetite improving and taking in good PO intake.Will continue to monitor for signs of severe CDI. - ID consult for medication management - Enteric precautions - Continue Fidaxomicin (10/24-11/4), day #3 - Daily CBC - Monitor VS - Serial abdominal exams  Metabolic Disarray- resolving Improvement in  hypocalcemia, hypomagnasemia and hypokalemia with corrected Ca2+ 8.5, corrected Na+ 134, and Mg 2.2. CK downtrended from 832 to 517. - AM CMP; replete as necessary - AM Mg+ - D/c'ed scheduled IV Mg+ repletion - Hepatic panel add-on  Hyperglycemia; T2DM CBGs: 301 > 350 > 174. Received 10u Semglee yesterday. CBG  improving. Cautious to increase insulin regimen given hypoglycemic episodes and difficulty controlling outpatient. A1c 5.7%. Home meds: Farxiga, Metformin, Lantus 10u daily - Continue Semglee 10u daily - sSSI - CBG q4 hours, at meals and bedtime - Hold home medications  AKI- resolving Cr 1.10 this morning, trending downward. Bladder scans ordered but not completed. Urine output not documented. - Encourage PO intake - Monitor I/O - Monitor Cr with BMP  Syncope with hx recurrent falls likely 2/2 dehydration, electrolyte imbalance and poor compliance with AED No further syncopal episodes. Had + orthostatics with PT yesterday. Keppra level 20.3, after receiving dose here. Will monitor for seizures/seizure-like activity (usually presents like a syncopal episodes). If seizure activity/syncope, will consult neurology inpatient. If not, will schedule follow-up office visit outpatient with Dr. [name] prior to discharge. - Repeat orthostatic vital signs - Continue Keppra - PT/OT - Seizure precautions - Fall precautions  QTC prolongation (improving) QT interval 462 on EKG this morning.  Hyponatremia, corrects to 133, likely due to poor nutritional status as well as dehydration.  Continue p.o. resuscitation at this time.  He is got an IV hydration and his kidney function has improved.  Normocytic anemia- near baseline, likely component of chronic disease, known cirrhosis. Recommend colonoscopy as outpatient, sent iron studies.  Social Situation Patient cares for adult children with disabilities at home, but patient is unable to completely care for himself and has confusion with managing  medications at home. Patient  adamantly does not want to lose custody of children. Given that he has been inpatient for 3 days, I will discuss the case with SW to see if we need further involvement in his children's care. This has been an ongoing issue, with concern from PCP as well.  - Consult to SW  Other conditions (neuropathy, esophagitis) chronic and stable.  FEN/GI: Carb modified PPx: Lovenox Dispo:HH PT; Acuity of illness still requiring inpatient monitoring, need to figure out proper outpatient medication management for C. Diff given insurance auth hurdle. Likely home tomorrow   Subjective:  [name] feels improved today and said his diarrhea is resolved. He wants to go home to care for his children that have handicaps. He denies abdominal pain, nausea, vomiting, etc.  Objective: Temp:  [97.4 F (36.3 C)-99.7 F (37.6 C)] 97.6 F (36.4 C) (10/26 0815) Pulse Rate:  [82-97] 95 (10/26 0815) Resp:  [12-20] 20 (10/26 0815) BP: (101-162)/(73-107) 102/73 (10/26 0815) SpO2:  [99 %-100 %] 99 % (10/26 0815) Physical Exam: General: Sitting on edge of bed eating breakfast on tray Cardiovascular: Regular rate and rhythm Respiratory: Normal respiratory effort. Clear breath sounds in all fields Abdomen: Soft, non-tender Extremities: Warm, dry, well-perfused. Chronic venous stasis changes and skin hyperpigmentation/thickening in bilateral lower extremities with edema  Laboratory: Recent Labs  Lab 07/29/21 1610 07/31/21 0542  WBC 8.3 6.8  HGB 12.1* 9.8*  HCT 33.0* 27.3*  PLT 204 166   Recent Labs  Lab 07/29/21 1610 07/29/21 2249 07/30/21 0700 07/30/21 1108 07/31/21 0542 07/31/21 0934 07/31/21 1530 07/31/21 2315 08/01/21 0632  NA 129*   < > 128*   < > 127*   < > 129* 131* 132*  K <2.0*   < > 2.9*   < > 5.1   < > 5.0 5.2* 4.8  CL 85*   < > 87*   < > 98   < > 97* 102 101  CO2 25   < > 29   < > 24   < > 26 20* 26  BUN 5*   < > 6   < > 5*   < > 5* 5* 6  CREATININE 1.41*   <  > 1.35*   < > 1.40*   < > 1.34* 1.22 1.10  CALCIUM 5.8*   < > 5.6*   < > 5.6*   < > 6.1* 6.0* 6.5*  PROT 6.0*  --  5.7*  --  5.1*  --   --   --   --   BILITOT 1.6*  --  1.7*  --  1.5*  --   --   --   --   ALKPHOS 195*  --  185*  --  168*  --   --   --   --   ALT 48*  --  45*  --  42  --   --   --   --   AST 83*  --  68*  --  47*  --   --   --   --   GLUCOSE 70   < > 135*   < > 430*   < > 332* 348* 205*   < > = values in this interval not displayed.    Imaging/Diagnostic Tests: No results found.   Orvis Brill, DO 08/01/2021, 8:30 AM PGY-1, [place] Intern pager: [phone], text pages welcome

## 2021-08-01 NOTE — Progress Notes (Signed)
FPTS Interim Progress Note  Informed by RN that patient was attempting to leave AMA. Went to bedside where I found patient standing in his doorway attempting to walk out.  Patient citing concerns about his children and their inability to care for themselves. States he needs to get home to take care of his adult children, who he states are on SSI and unable to feed themselves, unable to drive, and unable to properly communicate their needs with others.  I expressed my concerns to patient about him leaving AMA due to frequent falls, severe electrolyte derangements, C Diff infection, and seizure disorder. Expressed concern about his ability to care for himself (or his children) safely at home. Despite this, patient adamantly wanting to leave. He initially had some difficulty verbalizing risks of leaving, but was eventually able to repeat them back to me. He is oriented x3.  Patient somewhat uncooperative and intermittently using profanity toward staff during entire encounter. He was unable to ambulate more than 72ft on his own without pausing to rest. Appeared extremely unsteady despite the use of his cane.  After extensive efforts by nursing staff, social work, and entire care team, patient was agreeable to stay. Will give Ativan 1mg  PO x1 as there may be a component of alcohol withdrawal to his current behavior.  His neighbor confirmed his children are not incapacitated and that they are currently well taken care of (by their aunt, etc). They will be coming to bedside to visit. See social work note for additional details.   Alcus Dad, MD PGY-2 [place]

## 2021-08-01 NOTE — TOC Benefit Eligibility Note (Signed)
Patient Advocate Encounter  Patient is approved through the [employer] Patient Assistance Program for Dificid through 07/31/2022.   Medication will be mailed to patient's home.   Lyndel Safe, [place] Patient Advocate Specialist [place] Patient Advocate Team Direct Number: [phone]  Fax: [phone]

## 2021-08-01 NOTE — Evaluation (Signed)
Occupational Therapy Evaluation Patient Details Name: Jeffrey [name] MRN: [id number] DOB: May 09, 1966 Today's Date: 08/01/2021   History of Present Illness Pt is a 55 y.o. F who presents 07/29/2021 with a fall and multiple electrolyte abnormalities, found to be C. Dif. +, receiving Fidaxomicin. Significant PMH: alcohol use disorder, erosive espophagitis, seizure disorder, PEA with cardiac arrest in 2014 requiring prolonged intubation.   Clinical Impression   Patient admitted for the above diagnosis.  PTA the patient states he was driving to outpatient PT, lives at home and supervises his children, who have intellectual disabilities.  The patient states, they can care for themselves with respect to ADL and light meal prep.  The patient states he cares for his medications, and completes the majority of the IADL/meal prep for the family.  The patient would benefit from [place] at a SNF prior to returning home, but refuses to consider it.  OT will continue to follow in the acute setting, but [place] OT and no driving until cleared medically is recommended.  He remains orthostatic with SBP dropping from 115 in sitting to 90 standing, he became unsteady, and was sat down.  He was not able to walk more than 30' this date.       Recommendations for follow up therapy are one component of a multi-disciplinary discharge planning process, led by the attending physician.  Recommendations may be updated based on patient status, additional functional criteria and insurance authorization.   Follow Up Recommendations  Home health OT    Assistance Recommended at Discharge Other (comment) (Would not recommend driving until BP remains stable)  Functional Status Assessment  Patient has had a recent decline in their functional status and demonstrates the ability to make significant improvements in function in a reasonable and predictable amount of time.  Equipment Recommendations  BSC;Tub/shower seat     Recommendations for Other Services       Precautions / Restrictions Precautions Precautions: Fall;Other (comment) Precaution Comments: orthostatic hypotension Restrictions Weight Bearing Restrictions: No      Mobility Bed Mobility Overal bed mobility: Needs Assistance Bed Mobility: Supine to Sit     Supine to sit: Supervision       Patient Response: Flat affect  Transfers Overall transfer level: Needs assistance Equipment used: Quad cane Transfers: Sit to/from [facility] Sit to Stand: Min guard;From elevated surface Stand pivot transfers: Min guard                Balance Overall balance assessment: Needs assistance Sitting-balance support: Feet supported Sitting balance-Leahy Scale: Good     Standing balance support: Reliant on assistive device for balance Standing balance-Leahy Scale: Poor Standing balance comment: continues to be orthostatic                           ADL either performed or assessed with clinical judgement   ADL       Grooming: Wash/dry hands;Min guard;Standing       Lower Body Bathing: Sit to/from stand;Min guard       Lower Body Dressing: Min guard;Sit to/from stand   Toilet Transfer: Min guard;Ambulation Toilet Transfer Details (indicate cue type and reason): SPC Toileting- Clothing Manipulation and Hygiene: Min guard;Sit to/from stand         General ADL Comments: Continues to be orthostatic     Vision Baseline Vision/History: 1 Wears glasses Patient Visual Report: No change from baseline       Perception Perception Perception:  Not tested   Praxis Praxis Praxis: Not tested    Pertinent Vitals/Pain Pain Assessment: No/denies pain     Hand Dominance Right   Extremity/Trunk Assessment Upper Extremity Assessment Upper Extremity Assessment: Overall WFL for tasks assessed   Lower Extremity Assessment Lower Extremity Assessment: Defer to PT evaluation   Cervical / Trunk  Assessment Cervical / Trunk Assessment: Normal   Communication Communication Communication: HOH   Cognition Arousal/Alertness: Awake/alert Behavior During Therapy: WFL for tasks assessed/performed Overall Cognitive Status: Within Functional Limits for tasks assessed                                 General Comments: appears to be at his baseline for cognition     General Comments       Exercises     Shoulder Instructions      Home Living Family/patient expects to be discharged to:: Private residence Living Arrangements: Children Available Help at Discharge: Family Type of Home: House Home Access: Stairs to enter Technical brewer of Steps: 3 Entrance Stairs-Rails: Right Home Layout: One level     Bathroom Shower/Tub: Teacher, early years/pre: Standard     Home Equipment: Cane - Holiday representative (2 wheels)          Prior Functioning/Environment Prior Level of Function : Independent/Modified Independent             Mobility Comments: Trys to walk in the house w/o cane, but uses the cane outside.          OT Problem List: Decreased activity tolerance;Impaired balance (sitting and/or standing);Decreased safety awareness;Decreased strength      OT Treatment/Interventions: Self-care/ADL training;Therapeutic activities;Patient/family education;Balance training    OT Goals(Current goals can be found in the care plan section) Acute Rehab OT Goals Patient Stated Goal: I'm going home OT Goal Formulation: With patient Time For Goal Achievement: 08/15/21 Potential to Achieve Goals: Good ADL Goals Pt Will Perform Grooming: with modified independence;standing Pt Will Perform Lower Body Bathing: with modified independence;sit to/from stand Pt Will Perform Lower Body Dressing: with modified independence;sit to/from stand Pt Will Transfer to Toilet: with modified independence;ambulating;regular height toilet Pt Will Perform Toileting  - Clothing Manipulation and hygiene: with modified independence;sit to/from stand  OT Frequency: Min 2X/week   Barriers to D/C: Decreased caregiver support          Co-evaluation              AM-PAC OT "6 Clicks" Daily Activity     Outcome Measure Help from another person eating meals?: None Help from another person taking care of personal grooming?: None Help from another person toileting, which includes using toliet, bedpan, or urinal?: A Little Help from another person bathing (including washing, rinsing, drying)?: A Little Help from another person to put on and taking off regular upper body clothing?: None Help from another person to put on and taking off regular lower body clothing?: A Little 6 Click Score: 21   End of Session Equipment Utilized During Treatment: Gait belt;Other (comment) Nurse Communication: Mobility status  Activity Tolerance: Patient tolerated treatment well Patient left: in chair;with call bell/phone within reach;with chair alarm set  OT Visit Diagnosis: Unsteadiness on feet (R26.81);Dizziness and giddiness (R42)                Time: 6834-1962 OT Time Calculation (min): 25 min Charges:  OT General Charges $OT Visit: 1 Visit OT Evaluation $OT  Eval Moderate Complexity: 1 Mod OT Treatments $Self Care/Home Management : 8-22 mins  08/01/2021  RP, OTR/L  Acute Rehabilitation Services  Office:  [phone]   Metta Clines 08/01/2021, 11:56 AM

## 2021-08-01 NOTE — Progress Notes (Signed)
Inpatient Diabetes Program Recommendations  AACE/ADA: New Consensus Statement on Inpatient Glycemic Control (2015)  Target Ranges:  Prepandial:   less than 140 mg/dL      Peak postprandial:   less than 180 mg/dL (1-2 hours)      Critically ill patients:  140 - 180 mg/dL   Lab Results  Component Value Date   GLUCAP 174 (H) 08/01/2021   HGBA1C 5.7 (H) 07/31/2021    Review of Glycemic Control Results for VUE, PAVON (MRN [id number]) as of 08/01/2021 11:57  Ref. Range 07/31/2021 12:42 07/31/2021 17:41 07/31/2021 20:14 08/01/2021 08:19  Glucose-Capillary Latest Ref Range: 70 - 99 mg/dL 335 (H) 301 (H) 350 (H) 174 (H)   Diabetes history: Type 2 DM Outpatient Diabetes medications: Metformin 500 mg bid, Farxiga 10 mg qd, Lantus 20 units qd Current orders for Inpatient glycemic control: Novolog 0-9 units tid, Semglee 10 units qd  Inpatient Diabetes Program Recommendations:    Consider increasing Semglee to 16 units qd.   Thanks, Bronson Curb, MSN, RNC-OB Diabetes Coordinator [phone] (8a-5p)
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
## 2021-08-01 NOTE — TOC Benefit Eligibility Note (Signed)
[facility] Patient Advocate Encounter  Received notification from [facility] that the request for prior authorization for Dificid 200 mg has been denied due to not having an ID Consult and has not had a 10 day trail of Vancomycin.       Lyndel Safe, [place] Patient Advocate Specialist [place] Patient Advocate Team Direct Number: [phone]  Fax: [phone]

## 2021-08-01 NOTE — TOC Initial Note (Signed)
Transition of Care [facility]) - Initial/Assessment Note    Patient Details  Name: Jeffrey [name] MRN: [id number] Date of Birth: November 15, 1965  [facility]) CM/SW Contact:    Cyndi Bender, RN Phone Number: 08/01/2021, 1:50 PM  Clinical Narrative:               Orders for [place] and DME. Spoke to patient regarding transition needs.Choice offered with list provided per CMS guidelines from medicare.gov website with star ratings (copy placed in shadow chart).    Patient states he was doing outpatient PT at [facility]. Patient requesting to continue therapy outpatient.  Placed referral for outpatient PT/OT at [facility].  Patient declines the 3&1 DME. He states he has a walker and cane.   Expected Discharge Plan: OP Rehab Barriers to Discharge: Continued Medical Work up   Patient Goals and CMS Choice Patient states their goals for this hospitalization and ongoing recovery are:: return home CMS Medicare.gov Compare Post Acute Care list provided to:: Patient Choice offered to / list presented to : Patient  Expected Discharge Plan and Services Expected Discharge Plan: OP Rehab   Discharge Planning Services: CM Consult   Living arrangements for the past 2 months: Single Family Home                                      Prior Living Arrangements/Services Living arrangements for the past 2 months: Single Family Home   Patient language and need for interpreter reviewed:: Yes        Need for Family Participation in Patient Care: No (Comment)   Current home services: DME (walker and cane) Criminal Activity/Legal Involvement Pertinent to Current Situation/Hospitalization: No - Comment as needed  Activities of Daily Living      Permission Sought/Granted Permission sought to share information with : Facility Art therapist granted to share information with : Yes, Verbal  Permission Granted     Permission granted to share info w AGENCY: outpatient rehab        Emotional Assessment Appearance:: Appears older than stated age Attitude/Demeanor/Rapport: Engaged Affect (typically observed): Accepting Orientation: : Oriented to Self, Oriented to  Time, Oriented to Place, Oriented to Situation Alcohol / Substance Use: Not Applicable Psych Involvement: No (comment)  Admission diagnosis:  Hypokalemia [E87.6] Hypomagnesemia [E83.42] Hypoalbuminemia [E88.09] Weakness [R53.1] Hypoglycemia [E16.2] Recurrent falls [R29.6] Patient Active Problem List   Diagnosis Date Noted   Recurrent falls 07/29/2021   Grief counseling 01/04/2021   Incontinence 12/23/2020   Hospital discharge follow-up 11/24/2020   Syncope 11/17/2020   Hypomagnesemia 11/05/2020   Hematemesis with nausea    Alcohol abuse with intoxication ([place])    Duodenal ulcer hemorrhagic    GI bleed 10/31/2020   Hyperglycemia 10/31/2020   DKA (diabetic ketoacidosis) ([place]) 07/22/2020   Candidiasis 07/22/2020   Hyponatremia 06/28/2020   Eyebrow laceration, left, initial encounter    Hypokalemia    Hyperlipidemia associated with type 2 diabetes mellitus (HCC)    MGUS (monoclonal gammopathy of unknown significance) 05/31/2020   Seizure disorder ([place]) 03/16/2020   Type 2 diabetes mellitus with hyperglycemia, with long-term current use of insulin ([place]) 03/16/2020   Tremor 03/16/2020   Colon cancer screening 02/22/2020   Stage 3a chronic kidney disease ([place]) 01/29/2020   Hyperphosphatemia 01/29/2020   Major depressive disorder, recurrent episode ([place]) 01/13/2018   Weakness    Hypotension 07/27/2017  Seizure ([place]) 04/08/2017   Proteinuria 11/23/2015   Essential hypertension    Cirrhosis ([place]) 03/22/2015   Ataxia 03/20/2015   Elevated liver enzymes    Dysarthria 03/09/2015   AKI (acute kidney injury) ([place])    Acute blood loss anemia 11/17/2014   ETOH abuse    Hyperkalemia    Tachycardia     Melena 10/10/2014   Erosive esophagitis 10/10/2014   Somnolence 10/08/2014   Acute kidney injury ([place])    OSA on CPAP 12/28/2012   Cardiac arrest ([place]) 12/07/2012   Anoxic brain injury ([place]) 12/07/2012   ERECTILE DYSFUNCTION 04/14/2007   Type 2 diabetes mellitus with diabetic neuropathy, with long-term current use of insulin ([name]) 12/04/2006   HYPERCHOLESTEROLEMIA 12/04/2006   PCP:  [name], MD Pharmacy:   [facility] [address], [place] [address] Phone: [phone] Fax: [phone]  [facility] ([place]) [place], [place] [address] Phone: [phone] Fax: [place] [address] [place] [address] Phone: [phone] Fax: [phone]  [place], [place] [address] Phone: [phone] Fax: [phone]     Social Determinants of Health ([place]) Interventions    Readmission Risk Interventions Readmission Risk Prevention Plan 08/01/2021  Transportation Screening Complete  PCP or Specialist Appt within 5-7 Days Complete  Home Care Screening Patient refused  Medication Review (RN CM) Complete  Some recent data might be hidden

## 2021-08-02 LAB — COMPREHENSIVE METABOLIC PANEL
ALT: 44 U/L (ref 0–44)
ALT: 47 U/L — ABNORMAL HIGH (ref 0–44)
AST: 38 U/L (ref 15–41)
AST: 37 U/L (ref 15–41)
Albumin: <1.5 g/dL — ABNORMAL LOW (ref 3.5–5.0)
Albumin: <1.5 g/dL — ABNORMAL LOW (ref 3.5–5.0)
Alkaline Phosphatase: 175 U/L — ABNORMAL HIGH (ref 38–126)
Alkaline Phosphatase: 206 U/L — ABNORMAL HIGH (ref 38–126)
Anion gap: 4 — ABNORMAL LOW (ref 5–15)
Anion gap: 4 — ABNORMAL LOW (ref 5–15)
BUN: 6 mg/dL (ref 6–20)
BUN: 7 mg/dL (ref 6–20)
CO2: 26 mmol/L (ref 22–32)
CO2: 27 mmol/L (ref 22–32)
Calcium: 6.3 mg/dL — CL (ref 8.9–10.3)
Calcium: 6.5 mg/dL — ABNORMAL LOW (ref 8.9–10.3)
Chloride: 102 mmol/L (ref 98–111)
Chloride: 101 mmol/L (ref 98–111)
Creatinine, Ser: 1.26 mg/dL — ABNORMAL HIGH (ref 0.61–1.24)
Creatinine, Ser: 1.03 mg/dL (ref 0.61–1.24)
GFR, Estimated: >60 mL/min (ref >60)
GFR, Estimated: >60 mL/min (ref >60)
Glucose, Bld: 169 mg/dL — ABNORMAL HIGH (ref 70–99)
Glucose, Bld: 96 mg/dL (ref 70–99)
Potassium: 5.6 mmol/L — ABNORMAL HIGH (ref 3.5–5.1)
Potassium: 4.9 mmol/L (ref 3.5–5.1)
Sodium: 132 mmol/L — ABNORMAL LOW (ref 135–145)
Sodium: 132 mmol/L — ABNORMAL LOW (ref 135–145)
Total Bilirubin: 1.2 mg/dL (ref 0.3–1.2)
Total Bilirubin: 1.1 mg/dL (ref 0.3–1.2)
Total Protein: 5.4 g/dL — ABNORMAL LOW (ref 6.5–8.1)
Total Protein: 6 g/dL — ABNORMAL LOW (ref 6.5–8.1)

## 2021-08-02 LAB — CBC
HCT: 28.4 % — ABNORMAL LOW (ref 39.0–52.0)
Hemoglobin: 9.8 g/dL — ABNORMAL LOW (ref 13.0–17.0)
MCH: 30.7 pg (ref 26.0–34.0)
MCHC: 34.5 g/dL (ref 30.0–36.0)
MCV: 89 fL (ref 80.0–100.0)
Platelets: 179 10*3/uL (ref 150–400)
RBC: 3.19 MIL/uL — ABNORMAL LOW (ref 4.22–5.81)
RDW: 18.9 % — ABNORMAL HIGH (ref 11.5–15.5)
WBC: 9 10*3/uL (ref 4.0–10.5)
nRBC: 0.2 % (ref 0.0–0.2)

## 2021-08-02 LAB — IRON AND TIBC: Iron: 35 ug/dL — ABNORMAL LOW (ref 45–182)

## 2021-08-02 LAB — CULTURE, BLOOD (ROUTINE X 2): Special Requests: ADEQUATE

## 2021-08-02 LAB — GLUCOSE, CAPILLARY
Glucose-Capillary: 125 mg/dL — ABNORMAL HIGH (ref 70–99)
Glucose-Capillary: 102 mg/dL — ABNORMAL HIGH (ref 70–99)
Glucose-Capillary: 71 mg/dL (ref 70–99)
Glucose-Capillary: 82 mg/dL (ref 70–99)
Glucose-Capillary: 79 mg/dL (ref 70–99)

## 2021-08-02 LAB — FERRITIN: Ferritin: 208 ng/mL (ref 24–336)

## 2021-08-02 LAB — MAGNESIUM: Magnesium: 2 mg/dL (ref 1.7–2.4)

## 2021-08-02 MED ORDER — GABAPENTIN 300 MG PO CAPS
300.0000 mg | ORAL_CAPSULE | Freq: Three times a day (TID) | ORAL | Status: DC
  Administered 2021-08-02 – 2021-08-10 (×25): 300 mg via ORAL
  Filled 2021-08-02 (×25): qty 1

## 2021-08-02 MED ORDER — CALCIUM CARBONATE ANTACID 500 MG PO CHEW
400.0000 mg | CHEWABLE_TABLET | Freq: Two times a day (BID) | ORAL | Status: AC
  Administered 2021-08-02 (×2): 400 mg via ORAL
  Filled 2021-08-02 (×2): qty 2

## 2021-08-02 MED ORDER — DAPAGLIFLOZIN PROPANEDIOL 10 MG PO TABS
10.0000 mg | ORAL_TABLET | Freq: Every day | ORAL | Status: DC
  Administered 2021-08-02 – 2021-08-10 (×9): 10 mg via ORAL
  Filled 2021-08-02 (×9): qty 1

## 2021-08-02 NOTE — Progress Notes (Signed)
Patient bladder scanned for 401 after an incontinent episode. Residents on call paged and order for straight cath received. Straight cath yielded 750 clear, yellow urine.   Blood sugar was 71 at 1707 (pt refused lunch). Pt given 4oz of juice, recheck at 1819 was 82.

## 2021-08-02 NOTE — Progress Notes (Signed)
Family Medicine Teaching Service Daily Progress Note Intern Pager: [phone]  Patient name: Jeffrey [name] Medical record number: [id number] Date of birth: [birth year] Age: 55 y.o. Gender: male  Primary Care Provider: [name], MD Consultants: None Code Status: Full  Pt Overview and Major Events to Date:  10/23: Admitted, C diff antigen/toxic positive   Assessment and Plan: [name] is a 55 year old male who presented after a fall with multiple electrolyte abnormalities, now resolved after repletion. PMH significant for alcohol use disorder, erosive esophagitis, seizure disorder, PEA with cardiac arrest in 2014 requiring prolonged intubation.   Nonfulminant nonsevere clostridium difficile infection Maintains afebrile status with stable vital signs. Diarrhea episodes improving.  - ID consult for medication management - Enteric precautions - Continue Fidaxomicin on day 4 (10/24-11/4) - Daily CBC - Monitor VS - monitor clinically   Altered mental status Concern for alcohol withdrawal, also a consideration is gabapentin withdrawal given home dose was 900 mg tid and now 100 mg. Attempted to leave AMA yesterday. Noted to have tremor last night but none this morning. Neurological exam limited patient's mental state. No obvious hallucinations reported. Received Ativan 1 mg x1. CIWAs overnight have been 10>12>8>8. - monitor CIWAs with Ativan as appropriate - folic acid - thiamine  - multivitamin  - TOC consulted - will increase gabapentin dose to 300 mg tid - closely monitor clinically for mental status changes      Hyponatremia Na 132 this morning, likely multifactorial as secondary to decreased PO hydration and hyperglycemia. Corrected Na 133.  - continue to encourage PO hydration  - monitor for mental status changes  Hyperkalemia K 5.6. - recheck K at 1 pm - lokelma if needed   Hypocalcemia  Seems to have been an ongoing issue. Ca 6.3, corrected Ca 8.3.  -  calcium carbonate - daily CMP   Hyperglycemia In the setting of Type 2 DM. Home medications include farxiga, metformin and lantus 10 units daily. Glucose 169 this morning. - sSSI - semglee 10 units  - restart home farxiga  - monitor CBGs - hold home metformin   AKI Cr 1.26, likely secondary to prerenal causes and dehydration.  - Encourage PO hydration, start IVF if continues to worsen  - Monitor I/O - Monitor Cr   Syncope with hx recurrent falls likely 2/2 dehydration, electrolyte imbalance and poor compliance with AED No further syncopal episodes or seizure episodes.  - Continue Keppra - PT/OT - Seizure precautions - Fall precautions - Continue to monitor clinically  - Schedule outpatient follow up with Dr. [name] prior to discharge    QTC prolongation QT interval 498 on EKG this morning. - limit QTc prolonging medications  - monitor EKGs   Normocytic anemia Hgb 9.8 this morning, down from 10.8 yesterday. Further anemia workup notable for ferritin 208, mildly low iron of 35 and MCV 89.  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  - Appreciate SW assistance    FEN/GI: carb modified  PPx: Lovenox  Dispo:Home with home health  pending clinical improvement .  Subjective:  Over night received dose of Ativan as correlated with CIWAs which were monitored for concern for alcohol withdrawal. Seems confused during this morning's encounter, attempting to get out of bed but easily able to be redirected. Denies any pain.   Objective: Temp:  [97.6 F (36.4 C)-98.1 F (36.7 C)] 98 F (36.7  C) (10/27 0338) Pulse Rate:  [80-96] 80 (10/27 0338) Resp:  [15-22] 17 (10/27 0400) BP: (94-137)/(73-94) 114/86 (10/27 0400) SpO2:  [94 %-100 %] 94 % (10/27 0338) Physical Exam: General: Patient sitting upright in bed, in no acute distress. Cardiovascular: RRR, no  murmurs or gallops auscultated  Respiratory: CTAB Abdomen: soft, nontender, presence of bowel sounds Extremities: radial and distal pulses strong and equal bilaterally, chronic venous stasis changes noted on LE bilaterally with mild, non-pitting edema  Neuro: alert and awake, AOx2 (thinks we are at a camp), follows some commands, limited exam, no asterixis noted  Psych: mood appropriate, no agitation noted, easily redirectable   Laboratory: Recent Labs  Lab 07/31/21 0542 08/01/21 0632 08/02/21 0128  WBC 6.8 8.9 9.0  HGB 9.8* 10.8* 9.8*  HCT 27.3* 31.2* 28.4*  PLT 166 182 179   Recent Labs  Lab 07/31/21 0542 07/31/21 0934 07/31/21 2315 08/01/21 0632 08/02/21 0128  NA 127*   < > 131* 132* 132*  K 5.1   < > 5.2* 4.8 5.6*  CL 98   < > 102 101 102  CO2 24   < > 20* 26 26  BUN 5*   < > 5* 6 6  CREATININE 1.40*   < > 1.22 1.10 1.26*  CALCIUM 5.6*   < > 6.0* 6.5* 6.3*  PROT 5.1*  --   --  5.9* 5.4*  BILITOT 1.5*  --   --  1.2 1.2  ALKPHOS 168*  --   --  201* 175*  ALT 42  --   --  47* 44  AST 47*  --   --  41 38  GLUCOSE 430*   < > 348* 205* 169*   < > = values in this interval not displayed.      Imaging/Diagnostic Tests: No results found.   Donney Dice, DO 08/02/2021, 7:48 AM PGY-2, [facility] Intern pager: [phone], text pages welcome

## 2021-08-02 NOTE — Progress Notes (Signed)
PT Cancellation Note  Patient Details Name: Jeffrey [name] MRN: [id number] DOB: 05/22/1966   Cancelled Treatment:    Reason Eval/Treat Not Completed: Per RN, pt asking to put his shoes on because he needs to leave. PT deferred at this time as pt was trying to leave AMA yesterday and do not want to get him up walking and have him try to leave at this time. RN convinced him to lie back down at this time.   Arby Barrette, PT Acute Rehabilitation Services  Pager [phone] Office [phone]    Rexanne Mano 08/02/2021, 10:46 AM

## 2021-08-02 NOTE — Progress Notes (Signed)
Physical Therapy Treatment Patient Details Name: Jeffrey [name] MRN: [id number] DOB: 18-Aug-1966 Today's Date: 08/02/2021   History of Present Illness Pt is a 55 y.o. F who presents 07/29/2021 with a fall and multiple electrolyte abnormalities, found to be C. Dif. +, receiving Fidaxomicin. 1026 developed tremor and confusion (?DTs vs withdrawal from neurontin)  Significant PMH: alcohol use disorder, erosive espophagitis, seizure disorder, PEA with cardiac arrest in 2014 requiring prolonged intubation.    PT Comments    On arrival, pt trying to sit up to EOB and noted to be on bedpan. Pt easily redirected to return to supine. Noted to be lethargic (likely from meds due to CIWA protocol) and bedpan had spilled. Nurse tech in to assist with repositioning and cleaning patient. Unable to accurately assess discharge needs at this time. Will continue to monitor and update as appropriate.     Recommendations for follow up therapy are one component of a multi-disciplinary discharge planning process, led by the attending physician.  Recommendations may be updated based on patient status, additional functional criteria and insurance authorization.  Follow Up Recommendations   (continue to assess--pt lethargic; per chart pt wants to return to OPPT)     Assistance Recommended at Discharge Frequent or constant Supervision/Assistance  Equipment Recommendations  3in1 (PT)    Recommendations for Other Services       Precautions / Restrictions Precautions Precautions: Fall;Other (comment) Precaution Comments: orthostatic hypotension     Mobility  Bed Mobility Overal bed mobility: Needs Assistance Bed Mobility: Rolling Rolling: +2 for physical assistance;Max assist         General bed mobility comments: pt found soiled in bed and trying to sit up; NT called and in to assist with cleaning pt and settling patient back into bed; rolling rt and left for cleaning and changing linens     Transfers                   General transfer comment: pt too lethargic and unable to safely try sitting or standing    Ambulation/Gait                 Stairs             Wheelchair Mobility    Modified Rankin (Stroke Patients Only)       Balance                                            Cognition Arousal/Alertness: Lethargic;Suspect due to medications Behavior During Therapy: Flat affect Overall Cognitive Status: Impaired/Different from baseline Area of Impairment: Orientation;Following commands;Problem solving;Attention                 Orientation Level: Place;Time;Situation Current Attention Level: Focused   Following Commands: Follows one step commands inconsistently     Problem Solving: Slow processing;Decreased initiation;Difficulty sequencing;Requires verbal cues;Requires tactile cues General Comments: received haldol overnight due to confusion/agitation        Exercises      General Comments        Pertinent Vitals/Pain Pain Assessment: Faces Faces Pain Scale: No hurt    Home Living                          Prior Function            PT Goals (current goals can  now be found in the care plan section) Acute Rehab PT Goals Patient Stated Goal: continue OPPT Time For Goal Achievement: 08/14/21 Potential to Achieve Goals: Fair Progress towards PT goals: Not progressing toward goals - comment (?DTs)    Frequency    Min 3X/week      PT Plan Other (comment) (difficult to assess at this time due to ?DTs and lethargy)    Co-evaluation              AM-PAC PT "6 Clicks" Mobility   Outcome Measure  Help needed turning from your back to your side while in a flat bed without using bedrails?: A Lot Help needed moving from lying on your back to sitting on the side of a flat bed without using bedrails?: Total Help needed moving to and from a bed to a chair (including a wheelchair)?:  Total Help needed standing up from a chair using your arms (e.g., wheelchair or bedside chair)?: Total Help needed to walk in hospital room?: Total Help needed climbing 3-5 steps with a railing? : Total 6 Click Score: 7    End of Session   Activity Tolerance: Treatment limited secondary to medical complications (Comment) (lethargic, confused) Patient left: in bed;with call bell/phone within reach;with bed alarm set   PT Visit Diagnosis: Unsteadiness on feet (R26.81);Muscle weakness (generalized) (M62.81);History of falling (Z91.81);Difficulty in walking, not elsewhere classified (R26.2)     Time: 4742-5956 PT Time Calculation (min) (ACUTE ONLY): 20 min  Charges:  $Therapeutic Activity: 8-22 mins                      Arby Barrette, PT Acute Rehabilitation Services  Pager [phone] Office [phone]    Jeffrey [name] 08/02/2021, 4:21 PM

## 2021-08-03 LAB — CBC
HCT: 30 % — ABNORMAL LOW (ref 39.0–52.0)
Hemoglobin: 10.6 g/dL — ABNORMAL LOW (ref 13.0–17.0)
MCH: 31.1 pg (ref 26.0–34.0)
MCHC: 35.3 g/dL (ref 30.0–36.0)
MCV: 88 fL (ref 80.0–100.0)
Platelets: 158 10*3/uL (ref 150–400)
RBC: 3.41 MIL/uL — ABNORMAL LOW (ref 4.22–5.81)
RDW: 19.9 % — ABNORMAL HIGH (ref 11.5–15.5)
WBC: 8.5 10*3/uL (ref 4.0–10.5)
nRBC: 0 % (ref 0.0–0.2)

## 2021-08-03 LAB — COMPREHENSIVE METABOLIC PANEL
ALT: 41 U/L (ref 0–44)
AST: 37 U/L (ref 15–41)
Albumin: <1.5 g/dL — ABNORMAL LOW (ref 3.5–5.0)
Alkaline Phosphatase: 174 U/L — ABNORMAL HIGH (ref 38–126)
Anion gap: 5 (ref 5–15)
BUN: 7 mg/dL (ref 6–20)
CO2: 25 mmol/L (ref 22–32)
Calcium: 6.6 mg/dL — ABNORMAL LOW (ref 8.9–10.3)
Chloride: 104 mmol/L (ref 98–111)
Creatinine, Ser: 0.9 mg/dL (ref 0.61–1.24)
GFR, Estimated: >60 mL/min (ref >60)
Glucose, Bld: 96 mg/dL (ref 70–99)
Potassium: 4.8 mmol/L (ref 3.5–5.1)
Sodium: 134 mmol/L — ABNORMAL LOW (ref 135–145)
Total Bilirubin: 1.5 mg/dL — ABNORMAL HIGH (ref 0.3–1.2)
Total Protein: 5.4 g/dL — ABNORMAL LOW (ref 6.5–8.1)

## 2021-08-03 LAB — GLUCOSE, CAPILLARY
Glucose-Capillary: 68 mg/dL — ABNORMAL LOW (ref 70–99)
Glucose-Capillary: 86 mg/dL (ref 70–99)
Glucose-Capillary: 82 mg/dL (ref 70–99)
Glucose-Capillary: 217 mg/dL — ABNORMAL HIGH (ref 70–99)
Glucose-Capillary: 134 mg/dL — ABNORMAL HIGH (ref 70–99)
Glucose-Capillary: 179 mg/dL — ABNORMAL HIGH (ref 70–99)

## 2021-08-03 MED ORDER — DEXTROSE 50 % IV SOLN: 1.0000 | INTRAVENOUS | Status: DC | PRN

## 2021-08-03 MED ORDER — LORAZEPAM 1 MG PO TABS: 1.0000 mg | ORAL_TABLET | Freq: Three times a day (TID) | ORAL | Status: DC

## 2021-08-03 MED ORDER — INSULIN GLARGINE-YFGN 100 UNIT/ML ~~LOC~~ SOLN
6.0000 [IU] | Freq: Every day | SUBCUTANEOUS | Status: DC
  Administered 2021-08-03 – 2021-08-04 (×2): 6 [IU] via SUBCUTANEOUS
  Filled 2021-08-03 (×3): qty 0.06

## 2021-08-03 MED ORDER — LORAZEPAM 0.5 MG PO TABS
0.5000 mg | ORAL_TABLET | Freq: Three times a day (TID) | ORAL | Status: DC
  Administered 2021-08-03 (×2): 0.5 mg via ORAL
  Filled 2021-08-03 (×2): qty 1

## 2021-08-03 MED ORDER — DEXTROSE 50 % IV SOLN: 1.0000 | Freq: Once | INTRAVENOUS | Status: DC

## 2021-08-03 NOTE — Progress Notes (Addendum)
Family Medicine Teaching Service Daily Progress Note Intern Pager: [phone]  Patient name: Jeffrey [name] Medical record number: [id number] Date of birth: September 14, 1966 Age: 55 y.o. Gender: male  Primary Care Provider: [name], MD Consultants: None Code Status: Full  Pt Overview and Major Events to Date:  10/23: Admitted, C. difficile antigen/toxin positive  Assessment and Plan: Jeffrey [name] is a 55 year old male who presented after a fall with multiple electrolyte abnormalities that are resolved after repletion, receiving p.o. fidaxomicin treatment for C. difficile, now with altered mental status and questionable EtOH/gabapentin withdrawal.  PMH significant for alcohol use disorder, risk of esophagitis, seizure disorder, PA with cardiac arrest in 2014 requiring prolonged intubation  Nonfulminant nonsevere C. difficile infection  Afebrile, stable vital signs. -Continue fidaxomicin, day #4 (start date: 10/24; end date: 11/4) -Enteric precautions -Daily CBC -Monitor vital signs  Altered mental status Patient with CIWA 12 overnight, received 2 mg Ativan.  This morning on exam, patient was extremely somnolent and only arousable to sternal rub.  Question remains as to etiology of altered mental status with differential including gabapentin withdrawal, alcohol withdrawal, potential hepatic encephalopathy.  It is unlikely that this is alcohol withdrawal as he is 5 days out from his last drink.  Also unlikely to be gabapentin withdrawal, as we are unsure of how he was taking his medications at home in addition to the fact that there is limited data surrounding profound withdrawal causing altered mental status to this degree.  He becomes very somnolent with the higher dose Ativan we were providing him, so we will continue a scheduled lower dose Ativan to see if there is improvement in mental status. -Discontinue as needed Ativan -Start 0.5 mg Ativan every 8 hours scheduled -Monitor mental  status -Monitor CIWA -Continue folic acid, thiamine, multivitamin -Continue gabapentin 300 mg 3 times daily  Metabolic disarray with multiple electrolyte abnormalities Potassium 4.8, sodium 134, corrected calcium 8.6 this morning.  All stable. -Continue to monitor electrolytes with daily CMP  Hypoglycemia in setting of type 2 diabetes mellitus Patient had hypoglycemia with CBG 68 at 0433 overnight.  He was treated with juice with return to follow-up CGP CBG of 86.  Given that the he is having multiple episodes of hypoglycemia while inpatient, decreased Semglee to 6 units daily.  We will consider discontinuing Semglee completely tomorrow. -Sensitive sliding scale insulin -Semglee 6 units daily -Farxiga10 mg daily -Holding home metformin  Prerenal AKI Overnight, bladder scan with 343, bladder scan this morning with 510 mL drained in and out cath with 610 mL 1153 this morning.  Question if he is retaining urine likely secondary to altered mental status. -We will repeat bladder scan every 8 hours, if patient has scan with greater than 300 mL, will place Foley catheter  Hyponatremia Sodium 134.  This is likely due to poor nutritional status and dehydration. -Encourage p.o. fluid intake -Continue to monitor with labs  Chronic normocytic anemia Patient near baseline.  Hemoglobin 10.6, increased from 9.8 yesterday.  Likely from chronic disease, known cirrhosis. -Recommend colonoscopy outpatient  QTC prolongation QT interval 582 this morning.  Continues to have QT prolongation, and he has not on any medications that could be causing this.  We will continue to monitor. -EKG tomorrow  Syncope with history of recurrent falls Will schedule outpatient follow-up with neurology prior to discharge. -Continue Keppra -PT/OT -Seizure precautions -Fall precautions -Continue monitor for any syncopal-like episodes or seizures  FEN/GI: Carb modified PPx: Lovenox Dispo: HH PT, potentially SNF  given acuity  of illness.  Patient still requires inpatient monitoring secondary to altered mental status and further work-up.  Subjective:  Jeffrey [name] was altered this morning and only arousable to painful stimuli of sternal rub.  I was unable to discuss with him regarding any potential concerns or complaints.  Objective: Temp:  [97.8 F (36.6 C)-98.6 F (37 C)] 98.6 F (37 C) (10/28 1104) Pulse Rate:  [62-81] 75 (10/28 1104) Resp:  [17-20] 18 (10/28 1104) BP: (102-128)/(81-98) 105/81 (10/28 1104) SpO2:  [97 %-98 %] 97 % (10/28 0344) Physical Exam: General: Laying in bed, snoring, sleeping hunched over to his right side Cardiovascular: Regular rate and rhythm, HR in 60s Respiratory: Normal respiratory effort.  Clear breath sounds anteriorly Abdomen: Soft, nondistended Extremities: Warm, dry, well perfused.  Noted chronic venous stasis changes in bilateral lower extremities  Laboratory: Recent Labs  Lab 08/01/21 0632 08/02/21 0128 08/03/21 0108  WBC 8.9 9.0 8.5  HGB 10.8* 9.8* 10.6*  HCT 31.2* 28.4* 30.0*  PLT 182 179 158   Recent Labs  Lab 08/02/21 0128 08/02/21 1404 08/03/21 0108  NA 132* 132* 134*  K 5.6* 4.9 4.8  CL 102 101 104  CO2 26 27 25   BUN 6 7 7   CREATININE 1.26* 1.03 0.90  CALCIUM 6.3* 6.5* 6.6*  PROT 5.4* 6.0* 5.4*  BILITOT 1.2 1.1 1.5*  ALKPHOS 175* 206* 174*  ALT 44 47* 41  AST 38 37 37  GLUCOSE 169* 96 96    Imaging/Diagnostic Tests: No results found.   Orvis Brill, DO 08/03/2021, 12:44 PM PGY-1, [facility] Intern pager: [phone], text pages welcome

## 2021-08-03 NOTE — Progress Notes (Signed)
Occupational Therapy Treatment Patient Details Name: Jeffrey [name] MRN: [id number] DOB: July 19, 1966 Today's Date: 08/03/2021   History of present illness Pt is a 55 y.o. F who presents 07/29/2021 with a fall and multiple electrolyte abnormalities, found to be C. Dif. +, receiving Fidaxomicin. 1026 developed tremor and confusion (?DTs vs withdrawal from neurontin)  Significant PMH: alcohol use disorder, erosive espophagitis, seizure disorder, PEA with cardiac arrest in 2014 requiring prolonged intubation.   OT comments  Patient not progressing toward patient focused goals this session.  He presents with decreased LOA, difficulty following commands, and inability to assist with mobility.  RN states the patient had Ativan overnight, and this may be impacting his alertness.  Patient not safe to trial out of bed, and could not keep himself balanced on the edge of the bed.  Discharge recommendations changed to SNF based on performance and current abilities.  Patient would need 24 hour care, and does not have that level of assist at home.  OT to continue efforts in the acute setting, and recommendations can change based on progress.     Recommendations for follow up therapy are one component of a multi-disciplinary discharge planning process, led by the attending physician.  Recommendations may be updated based on patient status, additional functional criteria and insurance authorization.    Follow Up Recommendations  Skilled nursing-short term rehab (<3 hours/day)    Assistance Recommended at Discharge Frequent or constant Supervision/Assistance  Equipment Recommendations  BSC;Tub/shower seat    Recommendations for Other Services      Precautions / Restrictions Precautions Precautions: Fall Precaution Comments: orthostatic hypotension Restrictions Weight Bearing Restrictions: No       Mobility Bed Mobility Overal bed mobility: Needs Assistance Bed Mobility: Supine to Sit;Sit to  Supine Rolling: +2 for safety/equipment;Max assist   Supine to sit: Max assist Sit to supine: Total assist;Max assist   General bed mobility comments: needed assist with trunk and feet in both directions.  Unable to really help. Patient Response: Flat affect  Transfers Overall transfer level: Needs assistance Equipment used: Rolling walker (2 wheels) Transfers: Sit to/from Stand Sit to Stand: +2 physical assistance;Mod assist           General transfer comment: Not safe due to decreased LOA     Balance Overall balance assessment: Needs assistance Sitting-balance support: Feet supported;Bilateral upper extremity supported Sitting balance-Leahy Scale: Poor  Postural control: Posterior lean;Left lateral lean;Right lateral lean                            ADL either performed or assessed with clinical judgement   ADL       Grooming: Wash/dry hands;Wash/dry face;Minimal assistance;Bed level                                       Vision Baseline Vision/History: 1 Wears glasses Patient Visual Report: No change from baseline     Perception Perception Perception: Not tested   Praxis Praxis Praxis: Not tested    Cognition Arousal/Alertness: Lethargic Behavior During Therapy: Flat affect Overall Cognitive Status: Impaired/Different from baseline Area of Impairment: Orientation;Attention;Following commands;Problem solving;Awareness                 Orientation Level: Disoriented to;Situation Current Attention Level: Sustained   Following Commands: Follows one step commands inconsistently;Follows one step commands with increased time   Awareness: Intellectual  Problem Solving: Slow processing;Decreased initiation;Difficulty sequencing;Requires verbal cues;Requires tactile cues                             Pertinent Vitals/ Pain       Pain Assessment: Faces Faces Pain Scale: No hurt Pain Intervention(s): Monitored during  session                                                          Frequency  Min 2X/week        Progress Toward Goals  OT Goals(current goals can now be found in the care plan section)  Progress towards OT goals: Not progressing toward goals - comment  Acute Rehab OT Goals Patient Stated Goal: none stated due to lethargy OT Goal Formulation: With patient Time For Goal Achievement: 08/15/21 Potential to Achieve Goals: [place] Discharge plan needs to be updated    Co-evaluation                 AM-PAC OT "6 Clicks" Daily Activity     Outcome Measure   Help from another person eating meals?: A Lot Help from another person taking care of personal grooming?: A Lot Help from another person toileting, which includes using toliet, bedpan, or urinal?: A Lot Help from another person bathing (including washing, rinsing, drying)?: Total Help from another person to put on and taking off regular upper body clothing?: A Lot Help from another person to put on and taking off regular lower body clothing?: Total 6 Click Score: 10    End of Session    OT Visit Diagnosis: Unsteadiness on feet (R26.81);Dizziness and giddiness (R42);Muscle weakness (generalized) (M62.81);Other symptoms and signs involving cognitive function   Activity Tolerance Patient limited by lethargy   Patient Left in bed;with call bell/phone within reach;with bed alarm set   Nurse Communication          Time: 6333-5456 OT Time Calculation (min): 15 min  Charges: OT General Charges $OT Visit: 1 Visit OT Treatments $Therapeutic Activity: 8-22 mins  08/03/2021  RP, OTR/L  Acute Rehabilitation Services  Office:  [phone]   Metta Clines 08/03/2021, 4:17 PM

## 2021-08-03 NOTE — Progress Notes (Signed)
   08/03/21 1021 08/03/21 1153  Urine Characteristics  Urine Color  --  Yellow/straw (dark)  Urinary Interventions Bladder scan  --   Bladder Scan Volume (mL) 510 mL  --   Intermittent/Straight Cath (mL)  --  610 mL

## 2021-08-03 NOTE — Progress Notes (Signed)
FPTS Brief Progress Note  S:Pt sleeping.    O: BP (!) 139/96 (BP Location: Left Arm)   Pulse 69   Temp 98.5 F (36.9 C) (Axillary)   Resp 17   Ht 5\' 10"  (1.778 m)   Wt 112.5 kg   SpO2 97%   BMI 35.58 kg/m    RESP: equal chest rise and fall   A/P: CIWA last recorded at 4p. Reached out to the primary RN about pt's CIWA. Scheduled ativan in place.  Last bladder scan around noon showed low volume.   - Orders reviewed. Labs for AM ordered, which was adjusted as needed.    Lyndee Hensen, DO 08/03/2021, 11:32 PM PGY-3, [facility] Night Resident  Please page [phone] with questions.

## 2021-08-03 NOTE — Progress Notes (Incomplete)
Hypoglycemic Event  0433 CBG: 68  0500 Treatment: 8 oz juice/soda  Symptoms: Nervous/irritable  Follow-up CBG: 0535 CBG Result: 86  Possible Reasons for Event: Inadequate meal intake  Comments/MD notified: Corky Sox MD  MD said to continue ACHS order and encourage PO intake at breakfast. Discontinued order for one time dose D50. RN suggests initiating fluids w/ d5 since this is the third occurrence.   Jeffrey [name]

## 2021-08-03 NOTE — Progress Notes (Signed)
Physical Therapy Treatment Patient Details Name: JAKARRI LESKO MRN: [id number] DOB: [birth year] Today's Date: 08/03/2021   History of Present Illness Pt is a 55 y.o. F who presents 07/29/2021 with a fall and multiple electrolyte abnormalities, found to be C. Dif. +, receiving Fidaxomicin. 1026 developed tremor and confusion (?DTs vs withdrawal from neurontin)  Significant PMH: alcohol use disorder, erosive espophagitis, seizure disorder, PEA with cardiac arrest in 2014 requiring prolonged intubation.    PT Comments    Pt continues to demonstrate significant mobility and cognitive deficits. At this point recommend SNF. If pt's cognition clears and he begins to show faster progress with mobility may be able to consider other DC options.    Recommendations for follow up therapy are one component of a multi-disciplinary discharge planning process, led by the attending physician.  Recommendations may be updated based on patient status, additional functional criteria and insurance authorization.  Follow Up Recommendations  Skilled nursing-short term rehab (<3 hours/day)     Assistance Recommended at Discharge Frequent or constant Supervision/Assistance  Equipment Recommendations  Rolling walker (2 wheels);3in1 (PT)    Recommendations for Other Services       Precautions / Restrictions Precautions Precautions: Fall     Mobility  Bed Mobility Overal bed mobility: Needs Assistance Bed Mobility: Supine to Sit;Sit to Supine Rolling: +2 for safety/equipment;Max assist   Supine to sit: +2 for physical assistance;Mod assist     General bed mobility comments: Assist to bring legs off of bed, elevate trunk into sitting, and bring hips to EOB. Assist to lower trunk and bring legs up into bed when returning to supine.    Transfers Overall transfer level: Needs assistance Equipment used: Rolling walker (2 wheels) Transfers: Sit to/from Stand Sit to Stand: +2 physical assistance;Mod  assist           General transfer comment: Assist to initiate and bring hips up.    Ambulation/Gait Ambulation/Gait assistance: +2 physical assistance;Min assist Gait Distance (Feet): 3 Feet (side stepping up edge of bed) Assistive device: Rolling walker (2 wheels) Gait Pattern/deviations: Step-to pattern;Decreased step length - right;Decreased step length - left Gait velocity: decr Gait velocity interpretation: <1.31 ft/sec, indicative of household ambulator General Gait Details: Assis for balance and support. Stepped up side of bed.   Stairs             Wheelchair Mobility    Modified Rankin (Stroke Patients Only)       Balance Overall balance assessment: Needs assistance Sitting-balance support: Feet supported;Bilateral upper extremity supported Sitting balance-Leahy Scale: Poor Sitting balance - Comments: Initially mod assist due to rt lateral and posterior lean. Once midline established pt then able to maintain static sitting with UE support Postural control: Posterior lean;Right lateral lean Standing balance support: Bilateral upper extremity supported Standing balance-Leahy Scale: Poor Standing balance comment: walker and +2 min assist for static standing                            Cognition Arousal/Alertness: Awake/alert Behavior During Therapy: Flat affect Overall Cognitive Status: Impaired/Different from baseline Area of Impairment: Orientation;Attention;Following commands;Problem solving;Awareness                 Orientation Level: Disoriented to;Situation Current Attention Level: Sustained   Following Commands: Follows one step commands inconsistently;Follows one step commands with increased time   Awareness: Intellectual Problem Solving: Slow processing;Decreased initiation;Difficulty sequencing;Requires verbal cues;Requires tactile cues General Comments: Awake but very slow to  process.        Exercises      General  Comments        Pertinent Vitals/Pain Pain Assessment: Faces Faces Pain Scale: No hurt    Home Living                          Prior Function            PT Goals (current goals can now be found in the care plan section) Progress towards PT goals: Progressing toward goals    Frequency    Min 3X/week      PT Plan Discharge plan needs to be updated    Co-evaluation              AM-PAC PT "6 Clicks" Mobility   Outcome Measure  Help needed turning from your back to your side while in a flat bed without using bedrails?: Total Help needed moving from lying on your back to sitting on the side of a flat bed without using bedrails?: Total Help needed moving to and from a bed to a chair (including a wheelchair)?: Total Help needed standing up from a chair using your arms (e.g., wheelchair or bedside chair)?: Total Help needed to walk in hospital room?: Total Help needed climbing 3-5 steps with a railing? : Total 6 Click Score: 6    End of Session Equipment Utilized During Treatment: Gait belt Activity Tolerance: Patient tolerated treatment well Patient left: in bed;with call bell/phone within reach;with bed alarm set Nurse Communication: Mobility status PT Visit Diagnosis: Unsteadiness on feet (R26.81);Other abnormalities of gait and mobility (R26.89);Muscle weakness (generalized) (M62.81)     Time: 2811-8867 PT Time Calculation (min) (ACUTE ONLY): 15 min  Charges:  $Therapeutic Activity: 8-22 mins                     [place] Pager [phone] Office Center 08/03/2021, 1:15 PM

## 2021-08-03 NOTE — Progress Notes (Signed)
FPTS Brief Progress Note  S:Sleeping    O: BP 102/81 (BP Location: Left Arm)   Pulse 76   Temp 98.6 F (37 C) (Axillary)   Resp 17   Ht 5\' 10"  (1.778 m)   Wt 112.5 kg   SpO2 98%   BMI 35.58 kg/m    RESP: equal chest rise and fall  A/P: Next bladder scan has not yet been completed. Consider Foley and Flomax if he continues to have urinary retention.  - Orders reviewed. Labs for AM ordered, which was adjusted as needed.    Lyndee Hensen, DO 08/03/2021, 12:06 AM PGY-3, [facility] Night Resident  Please page [phone] with questions.

## 2021-08-03 NOTE — TOC Progression Note (Signed)
Transition of Care [facility]) - Progression Note    Patient Details  Name: Jeffrey [name] MRN: [id number] Date of Birth: Jul 28, 1966  Transition of [facility]) CM/SW Stevenson, LCSW Phone Number: 08/03/2021, 4:47 PM  Clinical Narrative:    CSW will continue to follow for progress with therapies as patient not likely to agree to SNF. If he does, his plan provides 100% coverage for 60 days/year since he has reached his out of pocket maximum [facility] Ref# [id number]).    Expected Discharge Plan: OP Rehab Barriers to Discharge: Continued Medical Work up  Expected Discharge Plan and Services Expected Discharge Plan: OP Rehab   Discharge Planning Services: CM Consult   Living arrangements for the past 2 months: Single Family Home                                       Social Determinants of Health (SDOH) Interventions    Readmission Risk Interventions Readmission Risk Prevention Plan 08/01/2021  Transportation Screening Complete  PCP or Specialist Appt within 5-7 Days Complete  Home Care Screening Patient refused  Medication Review (RN CM) Complete  Some recent data might be hidden

## 2021-08-03 NOTE — Progress Notes (Signed)
Glucose @ 2150 79-- given 2 apple juice w/ night medications.

## 2021-08-03 NOTE — Progress Notes (Signed)
Hypoglycemic Event  0433 CBG: 68  0500 Treatment: 8 oz juice/soda  Symptoms: Nervous/irritable  Follow-up CBG: 0535 CBG Result: 86  Possible Reasons for Event: Inadequate meal intake  Comments/MD notified: Corky Sox MD  MD said to continue ACHS order and encourage PO intake at breakfast. Ordered and Discontinued order for one time dose D50. RN suggests initiating fluids w/ d5 since this is the third occurrence of lower blood sugars requiring intervention.   Jeffrey [name]

## 2021-08-04 LAB — BASIC METABOLIC PANEL
Anion gap: 3 — ABNORMAL LOW (ref 5–15)
BUN: 7 mg/dL (ref 6–20)
CO2: 28 mmol/L (ref 22–32)
Calcium: 7.1 mg/dL — ABNORMAL LOW (ref 8.9–10.3)
Chloride: 105 mmol/L (ref 98–111)
Creatinine, Ser: 1.11 mg/dL (ref 0.61–1.24)
GFR, Estimated: >60 mL/min (ref >60)
Glucose, Bld: 132 mg/dL — ABNORMAL HIGH (ref 70–99)
Potassium: 4.9 mmol/L (ref 3.5–5.1)
Sodium: 136 mmol/L (ref 135–145)

## 2021-08-04 LAB — BLOOD GAS, VENOUS
Acid-Base Excess: 3.1 mmol/L — ABNORMAL HIGH (ref 0.0–2.0)
Bicarbonate: 27.8 mmol/L (ref 20.0–28.0)
Drawn by: 164
FIO2: 21
O2 Saturation: 86.3 %
Patient temperature: 37
pCO2, Ven: 48.1 mmHg (ref 44.0–60.0)
pH, Ven: 7.38 (ref 7.250–7.430)
pO2, Ven: 59.5 mmHg — ABNORMAL HIGH (ref 32.0–45.0)

## 2021-08-04 LAB — CBC WITH DIFFERENTIAL/PLATELET
Abs Immature Granulocytes: 0.06 10*3/uL (ref 0.00–0.07)
Basophils Absolute: 0 10*3/uL (ref 0.0–0.1)
Basophils Relative: 0 %
Eosinophils Absolute: 0 10*3/uL (ref 0.0–0.5)
Eosinophils Relative: 0 %
HCT: 31.1 % — ABNORMAL LOW (ref 39.0–52.0)
Hemoglobin: 10.8 g/dL — ABNORMAL LOW (ref 13.0–17.0)
Immature Granulocytes: 1 %
Lymphocytes Relative: 18 %
Lymphs Abs: 1.6 10*3/uL (ref 0.7–4.0)
MCH: 31.1 pg (ref 26.0–34.0)
MCHC: 34.7 g/dL (ref 30.0–36.0)
MCV: 89.6 fL (ref 80.0–100.0)
Monocytes Absolute: 1.1 10*3/uL — ABNORMAL HIGH (ref 0.1–1.0)
Monocytes Relative: 13 %
Neutro Abs: 6 10*3/uL (ref 1.7–7.7)
Neutrophils Relative %: 68 %
Platelets: 170 10*3/uL (ref 150–400)
RBC: 3.47 MIL/uL — ABNORMAL LOW (ref 4.22–5.81)
RDW: 20.4 % — ABNORMAL HIGH (ref 11.5–15.5)
WBC: 8.9 10*3/uL (ref 4.0–10.5)
nRBC: 0 % (ref 0.0–0.2)

## 2021-08-04 LAB — GLUCOSE, CAPILLARY
Glucose-Capillary: 96 mg/dL (ref 70–99)
Glucose-Capillary: 118 mg/dL — ABNORMAL HIGH (ref 70–99)
Glucose-Capillary: 146 mg/dL — ABNORMAL HIGH (ref 70–99)

## 2021-08-04 MED ORDER — ACETAMINOPHEN 325 MG PO TABS
650.0000 mg | ORAL_TABLET | Freq: Once | ORAL | Status: AC
  Administered 2021-08-04: 650 mg via ORAL
  Filled 2021-08-04: qty 2

## 2021-08-04 NOTE — Progress Notes (Addendum)
Family Medicine Teaching Service Daily Progress Note Intern Pager: [phone]  Patient name: Jeffrey [name] Medical record number: [id number] Date of birth: 1966/07/23 Age: 55 y.o. Gender: male  Primary Care Provider: [name], MD Consultants: None  Code Status: Full  Pt Overview and Major Events to Date:  10/23: Admitted, C. Diff positive  10/27: developed AMS  Assessment and Plan: Jeffrey [name] is a 55 y.o. male who initially presented with fall and severe electrolyte derangements who was found to be C Diff positive. Hospital course complicated by altered mental status, possibly related to alcohol withdrawal.  PMH significant for alcohol use disorder, seizure disorder, hx of PEA arrest and anoxic brain injury, DM2, HTN, and HFpEF.   Altered Mental Status Unable to assess on today's exam as patient remains asleep during exam. Etiology unclear, possibly alcohol withdrawal but seems to be out of this window. Also gabapentin withdrawal a possibility. On admission,  EEG normal. UDS negative with slightly elevated ammonia. Less likely metabolic encephalopathy as electrolyte derangements resolving. Recurrent fall history makes trauma more likely although CT head on admission normal. May consider hepatic workup if persists.  -neurology consulted, appreciate recommendations -gabapentin 188 mg tid  -folic acid -thiamine -multivitamin  -continue to monitor clinically    Nonfulminant Nonsevere C Diff Infection Episodes of diarrhea have improved since initiation of symptoms.  -Continue Fidaxomicin, day #7 (plan for 10 day course, 10/24-11/2) -Enteric precautions   QTc prolongation  Most recent QTc 482 on 10/29 but appropriate on telemetry during encounter. -awaiting am EKG -avoid QTc prolonging medications    Type 2 DM Chronic and stable. Most recent glucose 126 this morning.  -Semglee 6 units  -sSSI -home farxiga    Severe Electrolyte Abnormalities  Resolved. Na 135, K 4.5  and Ca 7.1 corrected to 9.1. -am BMP  Normocytic anemia Prior anemia workup a few days ago notable for ferritin 208, mildly low iron with uncalculated TIBC. Hgb 8.3 from 10.8 yesterday. Transfusion threshold 7.  -outpatient colonoscopy screening  -repeat pm CBC -monitor daily CBC -transfuse if Hgb <7   Acute Urinary Retention External catheter in place. Bladder scans overnight appropriate at 121 and 130 mL.  -Bladder scans q8h   Seizure Disorder Chronic and stable. No seizure activity noted during hospitalization. EEG without abnormalities on admission.  -continue Keppra 1000 mg bid -seizure precautions     FEN/GI: carb modified  PPx: Lovenox  Dispo:Home with home health  pending clinical improvement . Barriers include ongoing altered state.   Subjective:  No acute overnight events. Unable to obtain report from patient as he was asleep throughout exam and entire encounter. No further pain concerns from nurse.   Objective: Temp:  [98.5 F (36.9 C)-98.7 F (37.1 C)] 98.7 F (37.1 C) (10/29 2345) Pulse Rate:  [76-80] 76 (10/29 1541) Resp:  [17-18] 18 (10/29 2345) BP: (107-130)/(88-93) 107/88 (10/29 2345) SpO2:  [98 %-99 %] 99 % (10/29 2345) Physical Exam: General: Patient resting comfortably in bed, in no acute distress. HEENT: no evidence of JVD Cardiovascular: RRR, no murmurs or gallops auscultated  Respiratory: CTAB, no wheezing or rales appreciated  Abdomen: soft, nontender, presence of bowel sounds Extremities: radial and distal pulses strong and equal bilaterally, chronic venous stasis changes present, mild pedal edema present bilaterally  Neuro: asleep, does not awaken to my exam thus limited exam  Laboratory: Recent Labs  Lab 08/02/21 0128 08/03/21 0108 08/04/21 0151  WBC 9.0 8.5 8.9  HGB 9.8* 10.6* 10.8*  HCT 28.4* 30.0* 31.1*  PLT 179 158 170   Recent Labs  Lab 08/02/21 0128 08/02/21 1404 08/03/21 0108 08/04/21 0151  NA 132* 132* 134* 136  K 5.6*  4.9 4.8 4.9  CL 102 101 104 105  CO2 26 27 25 28   BUN 6 7 7 7   CREATININE 1.26* 1.03 0.90 1.11  CALCIUM 6.3* 6.5* 6.6* 7.1*  PROT 5.4* 6.0* 5.4*  --   BILITOT 1.2 1.1 1.5*  --   ALKPHOS 175* 206* 174*  --   ALT 44 47* 41  --   AST 38 37 37  --   GLUCOSE 169* 96 96 132*      Imaging/Diagnostic Tests: No results found.   Donney Dice, DO 08/05/2021, 1:07 AM PGY-2, [place] Intern pager: [phone], text pages welcome

## 2021-08-04 NOTE — Progress Notes (Addendum)
FPTS Interim Progress Note  S:Patient resting comfortably, asleep on my exam. Received page from nurse regarding scrotal pain, unable to assess as patient asleep. Nurse states that this pain came about as soon as he had his bath. Denies any further concerns. Informed nurse to let our team know if she needs anything else.   O: BP 111/88 (BP Location: Left Arm)   Pulse 76   Temp 98.7 F (37.1 C) (Oral)   Resp 18   Ht 5\' 10"  (1.778 m)   Wt 112.5 kg   SpO2 98%   BMI 35.58 kg/m   General: Patient resting comfortably in bed, in no acute distress. Resp: normal work of breathing, breathing comfortably on room air  A/P: 55 year old male who initially presented with recurrent falls and severe electrolyte abnormalities also found to be C. Diff positive. Continues to have altered mental status concerning for possible withdrawal from alcohol and/or gabapentin.  -electrolytes improving, continue to monitor electrolytes closely  -enteric precautions, continue fidaxomicin for C. diff -neurology consulted, awaiting recs for ongoing mental status changes  -tylenol 650 mg ordered once as needed for scrotal pain  -remainder of plan as per day team   Donney Dice, DO 08/04/2021, 10:06 PM PGY-2, [place] Medicine Service pager [phone]

## 2021-08-04 NOTE — Progress Notes (Signed)
Family Medicine Teaching Service Daily Progress Note Intern Pager: [phone]  Patient name: Jeffrey [name] Medical record number: [id number] Date of birth: January 17, 1966 Age: 55 y.o. Gender: male  Primary Care Provider: [name], MD Consultants: None Code Status: Full  Pt Overview and Major Events to Date:  10/23: admitted, C. Diff positive 10/27: developed altered mental status, ?alcohol withdrawal  Assessment and Plan:  Jeffrey [name] is a 55 y.o. male who initially presented with fall and severe electrolyte derangements who was found to be C Diff positive. Hospital course complicated by altered mental status, possibly related to alcohol withdrawal.  PMH significant for alcohol use disorder, seizure disorder, hx of PEA arrest and anoxic brain injury, DM2, HTN, and HFpEF.  Altered Mental Status Patient remains somnolent this morning, he is fully oriented but barely wakes to interact, does not follow commands reliably. Etiology unclear- previously thought to be related to alcohol withdrawal vs overtreatment with Ativan- both of which seem less likely at this point. Patient 6 days out from last drink, vital signs not consistent with alcohol withdrawal, no tremor or agitation. He last received 0.5mg  of Ativan 8 hours ago and is still somnolent. Need to look for alternate causes. CT head wnl on admission, EEG unremarkable, UDS negative, electrolytes have improved. Ammonia only minimally elevated. -Obtain VBG -d/c Ativan -Consult neurology, appreciate their input -Continue CIWA monitoring -Continue Gabapentin, folic acid, thiamine, MVI  Nonfulminant Nonsevere C Diff Infection 2 episodes of loose stool yesterday, which is drastically improved from admission. -Continue Fidaxomicin, day #6 (plan for 10 day course, 10/24-11/2) -Enteric precautions  Prolonged QTc Corrected QTc 482 on this morning's EKG -Continue to monitor with EKGs q8h  T2DM Glucose 82-217 over past 24h. Had  mild hypoglycemia on 10/27, which seems to be improved. -Semglee 6u -sSSI -Home Farxiga 10mg  daily  Severe Electrolyte Derangements- resolved Electrolytes within normal limits this morning with Na 136, K 4.9, corrected calcium 9.0 -Daily BMP, Mag  Acute Urinary Retention Required in&out cath x2 over the past 48hrs. However, bladder scans overnight and this morning with 93 and 158 mLs. -Bladder scans q8h -If >300 cc's, place foley  Seizure Disorder No clinical seizure activity noted. Had normal EEG on admission. -Continue Keppra 1000mg  BID   FEN/GI: Carb modified PPx: Lovenox Dispo:Home with home health  pending clinical improvement . Barriers include altered mental status.   Subjective:  No acute events overnight. Patient remains somnolent this morning, unable to provide any significant history.  Objective: Temp:  [97.8 F (36.6 C)-98.6 F (37 C)] 98.3 F (36.8 C) (10/29 0000) Pulse Rate:  [62-77] 74 (10/29 0000) Resp:  [17-19] 17 (10/28 1608) BP: (103-139)/(81-96) 128/95 (10/29 0000) SpO2:  [98 %] 98 % (10/29 0000) Physical Exam: General: somnolent but responds to gentle shaking and voice Cardiovascular: RRR, normal S1/S2 Respiratory: normal work of breathing on room air Abdomen: soft, nontender Neuro: PERRL, does not follow simple commands but no obvious focal deficits, 5/5 strength in all extremities. Oriented to person, place and time  Laboratory: Recent Labs  Lab 08/02/21 0128 08/03/21 0108 08/04/21 0151  WBC 9.0 8.5 8.9  HGB 9.8* 10.6* 10.8*  HCT 28.4* 30.0* 31.1*  PLT 179 158 170   Recent Labs  Lab 08/02/21 0128 08/02/21 1404 08/03/21 0108 08/04/21 0151  NA 132* 132* 134* 136  K 5.6* 4.9 4.8 4.9  CL 102 101 104 105  CO2 26 27 25 28   BUN 6 7 7 7   CREATININE 1.26* 1.03 0.90 1.11  CALCIUM  6.3* 6.5* 6.6* 7.1*  PROT 5.4* 6.0* 5.4*  --   BILITOT 1.2 1.1 1.5*  --   ALKPHOS 175* 206* 174*  --   ALT 44 47* 41  --   AST 38 37 37  --   GLUCOSE 169*  96 96 132*     Imaging/Diagnostic Tests: No new imaging over past 24h.    Alcus Dad, MD 08/04/2021, 7:35 AM PGY-2, [place] Intern pager: [phone], text pages welcome

## 2021-08-05 DIAGNOSIS — E8809 Other disorders of plasma-protein metabolism, not elsewhere classified: Secondary | ICD-10-CM

## 2021-08-05 DIAGNOSIS — G9341 Metabolic encephalopathy: Secondary | ICD-10-CM

## 2021-08-05 DIAGNOSIS — E875 Hyperkalemia: Secondary | ICD-10-CM

## 2021-08-05 DIAGNOSIS — F10129 Alcohol abuse with intoxication, unspecified: Secondary | ICD-10-CM

## 2021-08-05 LAB — CBC
HCT: 24.7 % — ABNORMAL LOW (ref 39.0–52.0)
HCT: 30.7 % — ABNORMAL LOW (ref 39.0–52.0)
Hemoglobin: 8.3 g/dL — ABNORMAL LOW (ref 13.0–17.0)
Hemoglobin: 10.4 g/dL — ABNORMAL LOW (ref 13.0–17.0)
MCH: 30.5 pg (ref 26.0–34.0)
MCH: 31 pg (ref 26.0–34.0)
MCHC: 33.6 g/dL (ref 30.0–36.0)
MCHC: 33.9 g/dL (ref 30.0–36.0)
MCV: 90.8 fL (ref 80.0–100.0)
MCV: 91.4 fL (ref 80.0–100.0)
Platelets: 128 10*3/uL — ABNORMAL LOW (ref 150–400)
Platelets: 166 10*3/uL (ref 150–400)
RBC: 2.72 MIL/uL — ABNORMAL LOW (ref 4.22–5.81)
RBC: 3.36 MIL/uL — ABNORMAL LOW (ref 4.22–5.81)
RDW: 20.3 % — ABNORMAL HIGH (ref 11.5–15.5)
RDW: 20.7 % — ABNORMAL HIGH (ref 11.5–15.5)
WBC: 6.2 10*3/uL (ref 4.0–10.5)
WBC: 6.2 10*3/uL (ref 4.0–10.5)
nRBC: 0.3 % — ABNORMAL HIGH (ref 0.0–0.2)
nRBC: 0 % (ref 0.0–0.2)

## 2021-08-05 LAB — COMPREHENSIVE METABOLIC PANEL
ALT: 36 U/L (ref 0–44)
AST: 38 U/L (ref 15–41)
Albumin: <1.5 g/dL — ABNORMAL LOW (ref 3.5–5.0)
Alkaline Phosphatase: 197 U/L — ABNORMAL HIGH (ref 38–126)
Anion gap: 3 — ABNORMAL LOW (ref 5–15)
BUN: 7 mg/dL (ref 6–20)
CO2: 25 mmol/L (ref 22–32)
Calcium: 7.1 mg/dL — ABNORMAL LOW (ref 8.9–10.3)
Chloride: 107 mmol/L (ref 98–111)
Creatinine, Ser: 1.04 mg/dL (ref 0.61–1.24)
GFR, Estimated: >60 mL/min (ref >60)
Glucose, Bld: 126 mg/dL — ABNORMAL HIGH (ref 70–99)
Potassium: 4.5 mmol/L (ref 3.5–5.1)
Sodium: 135 mmol/L (ref 135–145)
Total Bilirubin: 0.7 mg/dL (ref 0.3–1.2)
Total Protein: 4.5 g/dL — ABNORMAL LOW (ref 6.5–8.1)

## 2021-08-05 LAB — GLUCOSE, CAPILLARY
Glucose-Capillary: 84 mg/dL (ref 70–99)
Glucose-Capillary: 98 mg/dL (ref 70–99)
Glucose-Capillary: 286 mg/dL — ABNORMAL HIGH (ref 70–99)

## 2021-08-05 LAB — MAGNESIUM: Magnesium: 1.6 mg/dL — ABNORMAL LOW (ref 1.7–2.4)

## 2021-08-05 MED ORDER — MAGNESIUM SULFATE 4 GM/100ML IV SOLN
4.0000 g | Freq: Once | INTRAVENOUS | Status: AC
  Administered 2021-08-05: 4 g via INTRAVENOUS
  Filled 2021-08-05: qty 100

## 2021-08-05 MED ORDER — INSULIN ASPART 100 UNIT/ML IJ SOLN
0.0000 [IU] | Freq: Three times a day (TID) | INTRAMUSCULAR | Status: DC
  Administered 2021-08-06: 3 [IU] via SUBCUTANEOUS
  Administered 2021-08-06 – 2021-08-07 (×3): 2 [IU] via SUBCUTANEOUS
  Administered 2021-08-07 – 2021-08-08 (×2): 4 [IU] via SUBCUTANEOUS
  Administered 2021-08-08: 1 [IU] via SUBCUTANEOUS
  Administered 2021-08-08: 3 [IU] via SUBCUTANEOUS
  Administered 2021-08-09: 1 [IU] via SUBCUTANEOUS
  Administered 2021-08-09: 2 [IU] via SUBCUTANEOUS

## 2021-08-05 NOTE — Plan of Care (Signed)
  Problem: Education: Goal: Knowledge of General Education information will improve Description Including pain rating scale, medication(s)/side effects and non-pharmacologic comfort measures Outcome: Progressing   Problem: Health Behavior/Discharge Planning: Goal: Ability to manage health-related needs will improve Outcome: Progressing   

## 2021-08-05 NOTE — Consult Note (Signed)
Neurology Consultation  Reason for Consult: seizure like activity.  Referring Physician: Kandice Robinsons., MD.   CC: seizure like activity, encephalopathy.   History is obtained from: chart, attending MD.   HPI: Jeffrey [name] is a 55 yo male with a PMHx of ETOH abuse, seizures, CDiff, Hx of PEA arrest and anoxic brain injury, DM II, HTN, cirrhosis, and CsHF. Patient presented to [facility] 6 days ago with severe metabolic derangements and c/o diarrhea for several days. Had syncope at home and in ED. Ethanol level elevated with ammonia of 61. He was placed on CIWA and received Ativan prn, but has been off Ativan for at least 24 hours.   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 Gabapentin has been restarted per teaching service, but at lower dose.   Per chart, it appears patient started to wake up on 08/03/21 and has progressively gotten better.   Neurology asked to consult due to history of seizures and to evaluate encephalopathy.   ROS: A robust ROS was performed and is negative except as noted in the HPI.   PMHx:  ETOH abuse, TBI, anxiety, GIB, cardiac arrest, HLD, HTN, OSA, seizures, DM II, venous insufficiency.   Family History  Problem Relation Age of Onset   Other Mother        Unsure of medical history.   Diabetes Mellitus II Father    Colon polyps Neg Hx    Colon cancer Neg Hx    Esophageal cancer Neg Hx    Rectal cancer Neg Hx    Stomach cancer Neg Hx    Social History:   reports that he has never smoked. His smokeless tobacco use includes snuff. He reports current alcohol use. He reports that he does not use drugs. ETOH abuse.   Medications  Current Facility-Administered Medications:    aspirin EC tablet 81 mg, 81 mg, Oral, QHS, Corky Sox, MD, 81 mg at 08/04/21 2039   atorvastatin (LIPITOR) tablet 80 mg, 80 mg, Oral, QHS, Corky Sox, MD, 80 mg at 08/04/21 2040   dapagliflozin propanediol (FARXIGA) tablet 10 mg, 10 mg, Oral,  Daily, Alcus Dad, MD, 10 mg at 08/05/21 0931   dextrose 50 % solution 50 mL, 1 ampule, Intravenous, PRN, Brimage, Vondra, DO   enoxaparin (LOVENOX) injection 40 mg, 40 mg, Subcutaneous, Daily, Corky Sox, MD, 40 mg at 08/05/21 1000   famotidine (PEPCID) tablet 20 mg, 20 mg, Oral, Daily, Dameron, Marisa, DO, 20 mg at 08/05/21 0934   feeding supplement (BOOST / RESOURCE BREEZE) liquid 1 Container, 1 Container, Oral, TID BM, Brimage, Vondra, DO, 1 Container at 08/05/21 1308   fidaxomicin (DIFICID) tablet 200 mg, 200 mg, Oral, BID, Dameron, Marisa, DO, 200 mg at 65/78/46 9629   folic acid (FOLVITE) tablet 1 mg, 1 mg, Oral, Daily, Corky Sox, MD, 1 mg at 08/05/21 0932   gabapentin (NEURONTIN) capsule 300 mg, 300 mg, Oral, TID, Alcus Dad, MD, 300 mg at 08/05/21 0800   levETIRAcetam (KEPPRA) tablet 1,000 mg, 1,000 mg, Oral, BID, Corky Sox, MD, 1,000 mg at 08/05/21 5284   multivitamin with minerals tablet 1 tablet, 1 tablet, Oral, Daily, Corky Sox, MD, 1 tablet at 08/05/21 1324   thiamine tablet 100 mg, 100 mg, Oral, Daily, Corky Sox, MD, 100 mg at 08/05/21 4010  Exam: Current vital signs: BP 106/79 (BP Location: Left Arm)   Pulse 70   Temp 98.9 F (37.2 C) (Axillary)   Resp 16   Ht  5\' 10"  (1.778 m)   Wt 112.5 kg   SpO2 98%   BMI 35.58 kg/m  Vital signs in last 24 hours: Temp:  [98.5 F (36.9 C)-98.9 F (37.2 C)] 98.9 F (37.2 C) (10/30 0803) Pulse Rate:  [62-80] 70 (10/30 0803) Resp:  [16-19] 16 (10/30 0803) BP: (94-130)/(67-93) 106/79 (10/30 0803) SpO2:  [98 %-99 %] 98 % (10/30 0315)  PE: GENERAL: Fairly well appearing male in NAD. Awake and alert. HEENT: - Normocephalic and atraumatic, moist mucous membranes. LUNGS - Normal respiratory effort.  CV - RRR. ABDOMEN - Soft, nontender. Ext: warm, well perfused. Psych: Affect appropriate to situation.  NEURO:  Mental Status: Awake, alert, and oriented x3.  Speech/Language: speech is slowed, but  without dysarthria or aphasia. Naming, repetition, fluency, and comprehension intact. Cranial Nerves:  II: PERRL 6mm/brisk. visual fields full. III, IV, VI: EOMI. Lid elevation symmetric and full.  V: sensation is intact and symmetrical to face.  VII: Smile is symmetrical.  VIII:hearing intact to voice. IX, X: palate elevation is symmetric. Phonation normal.  XI: normal sternocleidomastoid and trapezius muscle strength. YWV:PXTGGY is symmetrical without fasciculations.   Motor:  5/5 throughout.  Tone is normal. Bulk is normal.  Sensation- Intact to light touch bilaterally in all four extremities. Extinction absent to DSS.  Coordination: FTN intact bilaterally. HKS intact bilaterally. No drift.  DTRs: brachioradialis  R  2  L 2  patella  R 0    L 0 Cerebellar: No tremor, clonus. Gait: deferred.  Labs Ca and Mg still low. Creat 1.04.   CBC    Component Value Date/Time   WBC 6.2 08/05/2021 0131   RBC 2.72 (L) 08/05/2021 0131   HGB 8.3 (L) 08/05/2021 0131   HGB 11.8 (L) 02/02/2021 1504   HCT 24.7 (L) 08/05/2021 0131   HCT 36.6 (L) 02/02/2021 1504   PLT 128 (L) 08/05/2021 0131   PLT 449 02/02/2021 1504   MCV 90.8 08/05/2021 0131   MCV 90 02/02/2021 1504   MCH 30.5 08/05/2021 0131   MCHC 33.6 08/05/2021 0131   RDW 20.3 (H) 08/05/2021 0131   RDW 15.0 02/02/2021 1504   LYMPHSABS 1.6 08/04/2021 0151   LYMPHSABS 2.5 11/16/2020 1552   MONOABS 1.1 (H) 08/04/2021 0151   EOSABS 0.0 08/04/2021 0151   EOSABS 0.1 11/16/2020 1552   BASOSABS 0.0 08/04/2021 0151   BASOSABS 0.1 11/16/2020 1552   CMP     Component Value Date/Time   NA 135 08/05/2021 0131   NA 138 02/27/2021 1511   K 4.5 08/05/2021 0131   CL 107 08/05/2021 0131   CO2 25 08/05/2021 0131   GLUCOSE 126 (H) 08/05/2021 0131   BUN 7 08/05/2021 0131   BUN 5 (L) 02/27/2021 1511   CREATININE 1.04 08/05/2021 0131   CREATININE 1.00 06/19/2016 1601   CALCIUM 7.1 (L) 08/05/2021 0131   CALCIUM 8.8 10/20/2007 0535   PROT 4.5  (L) 08/05/2021 0131   PROT 5.9 (L) 02/27/2021 1511   ALBUMIN <1.5 (L) 08/05/2021 0131   ALBUMIN 2.1 (L) 02/27/2021 1511   AST 38 08/05/2021 0131   ALT 36 08/05/2021 0131   ALKPHOS 197 (H) 08/05/2021 0131   BILITOT 0.7 08/05/2021 0131   BILITOT 0.3 02/27/2021 1511   GFRNONAA >60 08/05/2021 0131   GFRNONAA 86 06/19/2016 1601   GFRAA 48 (L) 11/16/2020 1552   GFRAA >89 06/19/2016 1601   Imaging  CT head Age advanced atrophy.  No acute intracranial abnormality.  EEG 07/30/21 This study is  within normal limits. No seizures or epileptiform discharges were seen throughout the recording.  Assessment: 55 yo male who presented to [facility] 6 days ago with syncope, diarrhea, severe metabolic derangements, and intoxication. His mental status has improved over time and has been alert since 08/03/21. His encephalopathy is most likely consistent with metabolic causes. The missed Gabapentin could definitely contributed to encephalopathy as he is improved after restarting Gabapentin at lowered dose. Would keep him on a lowered dose in the long run because of his likely non adherence and heavy ETOH abuse.   Recommendations: -Continue Keppra at 1gm po q12 hours and on discharge.  -Needs out patient neurology f/up in 4 weeks on discharge.  -Titrate Gabapentin slowly and keep on the lowest dose possible for his neuropathy.  -Agree with Thiamine.  -seizure precautions. -Neurology will be available prn for questions.   Pt seen by Clance Boll, NP/Neuro and later by MD. Note/plan to be edited by MD as needed.  Pager: [phone]   NEUROHOSPITALIST ADDENDUM Performed a face to face diagnostic evaluation.   I have reviewed the contents of history and physical exam as documented by PA/ARNP/Resident and agree with above documentation.  I have discussed and formulated the above plan as documented. Edits to the note have been made as needed.  Impression/Key exam findings/Plan: Mentation significantly  improved and now oriented x 3 and talking to his niece when I saw him at bedside. Given he is getting better, I suspect this was probably all toxic metabolic encephalopathy. No further workup at this time. We will signoff. Agree with gradually reintroducing his gabapentin.  Donnetta Simpers, MD [facility] [phone]   If 7pm to 7am, please call on call as listed on AMION.

## 2021-08-06 LAB — COMPREHENSIVE METABOLIC PANEL
ALT: 39 U/L (ref 0–44)
AST: 37 U/L (ref 15–41)
Albumin: <1.5 g/dL — ABNORMAL LOW (ref 3.5–5.0)
Alkaline Phosphatase: 268 U/L — ABNORMAL HIGH (ref 38–126)
Anion gap: 4 — ABNORMAL LOW (ref 5–15)
BUN: 8 mg/dL (ref 6–20)
CO2: 28 mmol/L (ref 22–32)
Calcium: 7.8 mg/dL — ABNORMAL LOW (ref 8.9–10.3)
Chloride: 104 mmol/L (ref 98–111)
Creatinine, Ser: 1.4 mg/dL — ABNORMAL HIGH (ref 0.61–1.24)
GFR, Estimated: 59 mL/min — ABNORMAL LOW (ref >60)
Glucose, Bld: 265 mg/dL — ABNORMAL HIGH (ref 70–99)
Potassium: 5 mmol/L (ref 3.5–5.1)
Sodium: 136 mmol/L (ref 135–145)
Total Bilirubin: 0.7 mg/dL (ref 0.3–1.2)
Total Protein: 5.5 g/dL — ABNORMAL LOW (ref 6.5–8.1)

## 2021-08-06 LAB — GLUCOSE, CAPILLARY
Glucose-Capillary: 237 mg/dL — ABNORMAL HIGH (ref 70–99)
Glucose-Capillary: 275 mg/dL — ABNORMAL HIGH (ref 70–99)
Glucose-Capillary: 249 mg/dL — ABNORMAL HIGH (ref 70–99)
Glucose-Capillary: 231 mg/dL — ABNORMAL HIGH (ref 70–99)

## 2021-08-06 LAB — CBC
HCT: 31.2 % — ABNORMAL LOW (ref 39.0–52.0)
Hemoglobin: 10.6 g/dL — ABNORMAL LOW (ref 13.0–17.0)
MCH: 30.9 pg (ref 26.0–34.0)
MCHC: 34 g/dL (ref 30.0–36.0)
MCV: 91 fL (ref 80.0–100.0)
Platelets: 129 10*3/uL — ABNORMAL LOW (ref 150–400)
RBC: 3.43 MIL/uL — ABNORMAL LOW (ref 4.22–5.81)
RDW: 20.3 % — ABNORMAL HIGH (ref 11.5–15.5)
WBC: 5.7 10*3/uL (ref 4.0–10.5)
nRBC: 0 % (ref 0.0–0.2)

## 2021-08-06 LAB — MAGNESIUM: Magnesium: 2.3 mg/dL (ref 1.7–2.4)

## 2021-08-06 MED ORDER — INSULIN GLARGINE-YFGN 100 UNIT/ML ~~LOC~~ SOLN
6.0000 [IU] | Freq: Every day | SUBCUTANEOUS | Status: DC
  Administered 2021-08-06: 6 [IU] via SUBCUTANEOUS
  Filled 2021-08-06 (×2): qty 0.06

## 2021-08-06 NOTE — Progress Notes (Signed)
Family Medicine Teaching Service Daily Progress Note Intern Pager: [phone]  Patient name: Jeffrey [name] Medical record number: [id number] Date of birth: 1966-05-12 Age: 55 y.o. Gender: male  Primary Care Provider: [name], MD Consultants: neurology (signed off) Code Status: FULL  Pt Overview and Major Events to Date:  10/23: admitted, C. Diff positive 10/27: developed AMS 10/31: AMS resolved  Assessment and Plan: Patient is a 55 year old male who initially presented with falls and severe electrolyte derangements, found to be C. difficile positive.  Hospital course complicated by altered mental status.  PMH significant for alcohol use disorder, seizure disorder, PEA arrest anoxic brain injury, T2DM, hypertension, HFpEF.  Altered mental status Patient has improved mental status this morning, fully alert and oriented.  Etiology of the altered mental status possibly a combination of alcohol withdrawal, metabolic encephalopathy, and withdrawal from gabapentin. - Neurology consulted, signed off - Continue gabapentin 300 mg 3 times daily - Continue thiamine and folic acid -- PT/OT, haven't seen since 28th, potential discharge -- Orthostatic vitals per PT  Elevated Cr Cr of 1.4 this AM, however, baseline appears variable from 1-1.5.  Non-fulminant, nonsevere C. difficile infection Patient reports normally formed bowel movements and denies diarrhea. No leukocytosis.  - Continue fidaxomicin, day 8 out of 10, (-11/2) - Enteric precautions  T2DM Fasting CBG of 237 this AM. CBGs from 84-286 over the past 24 hrs. Home regimen of 20U of Lantus daily.  -Add back Semglee 6 units -sSSI -Home farxiga  Electrolyte abnormalities K of 5.0 this AM, mag of 2.3, Ca much improved at 7.8.  -Daily BMP  QT prolongation (resolved) EKG with Qtc 484 this morning.   Anemia Hgb at 10.6, appears to be baseline -OP colonoscopy  Urinary retention Bladder scan of 155 mL this AM.   Seizure  Disorder Chronic and stable. No seizure activity noted during hospitalization. EEG without abnormalities on admission.  -continue Keppra 1000 mg bid -seizure precautions   FEN/GI: carb modified diet PPx: Lovenox Dispo: Follow up PT recs, may be discharged today  Subjective:  Patient is fully alert and oriented this morning. He appears similar if not improved from admission mental status. HE reports formed bowel movements with no diarrhea. He desires to leave and go home as soon as possible.   Objective: Temp:  [98.6 F (37 C)-98.7 F (37.1 C)] 98.7 F (37.1 C) (10/31 0320) Pulse Rate:  [72-75] 75 (10/31 0807) Resp:  [18] 18 (10/31 0807) BP: (106-123)/(78-95) 111/84 (10/31 2353) Physical Exam Vitals reviewed.  Cardiovascular:     Rate and Rhythm: Normal rate and regular rhythm.     Heart sounds: No murmur heard. Pulmonary:     Effort: Pulmonary effort is normal.     Breath sounds: Normal breath sounds.  Abdominal:     General: Bowel sounds are normal.     Palpations: Abdomen is soft.     Tenderness: There is no abdominal tenderness.  Neurological:     Mental Status: He is alert.     Comments: No tremor     Laboratory: Recent Labs  Lab 08/04/21 0151 08/05/21 0131 08/05/21 1504  WBC 8.9 6.2 6.2  HGB 10.8* 8.3* 10.4*  HCT 31.1* 24.7* 30.7*  PLT 170 128* 166   Recent Labs  Lab 08/02/21 1404 08/03/21 0108 08/04/21 0151 08/05/21 0131  NA 132* 134* 136 135  K 4.9 4.8 4.9 4.5  CL 101 104 105 107  CO2 27 25 28 25   BUN 7 7 7 7   CREATININE 1.03  0.90 1.11 1.04  CALCIUM 6.5* 6.6* 7.1* 7.1*  PROT 6.0* 5.4*  --  4.5*  BILITOT 1.1 1.5*  --  0.7  ALKPHOS 206* 174*  --  197*  ALT 47* 41  --  36  AST 37 37  --  38  GLUCOSE 96 96 132* 126*     Imaging/Diagnostic Tests: None new   Corky Sox, MD PGY-1 [place] Intern pager: [phone], text pages welcome

## 2021-08-06 NOTE — TOC Progression Note (Signed)
Transition of Care [facility]) - Progression Note    Patient Details  Name: Jeffrey [name] MRN: [id number] Date of Birth: [birth year]  Transition of Care [facility]) CM/SW [place], [place] Phone Number: 08/06/2021, 5:50 PM  Clinical Narrative:    Patient agreeable to going to SNF short term. CSW sent out referral to facilities in network w/[facility], though none are in [place] unfortunately.    Expected Discharge Plan: Skilled Nursing Facility Barriers to Discharge: SNF Pending bed offer, Insurance Authorization  Expected Discharge Plan and Services Expected Discharge Plan: [place] In-house Referral: Clinical Social Work Discharge Planning Services: CM Consult   Living arrangements for the past 2 months: [employer] Determinants of Health (SDOH) Interventions    Readmission Risk Interventions Readmission Risk Prevention Plan 08/01/2021  Transportation Screening Complete  PCP or Specialist Appt within 5-7 Days Complete  Home Care Screening Patient refused  Medication Review (RN CM) Complete  Some recent data might be hidden

## 2021-08-06 NOTE — Progress Notes (Signed)
Occupational Therapy Treatment Patient Details Name: Jeffrey [name] MRN: [id number] DOB: 07-Jul-1966 Today's Date: 08/06/2021   History of present illness Pt is a 55 y.o. F who presents 07/29/2021 with a fall and multiple electrolyte abnormalities, found to be C. Dif. +, receiving Fidaxomicin. 1026 developed tremor and confusion (?DTs vs withdrawal from neurontin)  Significant PMH: alcohol use disorder, erosive espophagitis, seizure disorder, PEA with cardiac arrest in 2014 requiring prolonged intubation.   OT comments  Patient received in bed and appeared more alert. Patient was able to get to eob with increased time and min assist with cues for rail use. Patient sat on eob and performed static standing with min assist +2 to stand and min guard for balance. Patient performed transfer to recliner and functional mobility for short distances.  Patient stood from recliner to perform grooming and addressed doffing socks but was unable to perform.  Acute OT to continue to follow.    Recommendations for follow up therapy are one component of a multi-disciplinary discharge planning process, led by the attending physician.  Recommendations may be updated based on patient status, additional functional criteria and insurance authorization.    Follow Up Recommendations  Skilled nursing-short term rehab (<3 hours/day)    Assistance Recommended at Discharge Frequent or constant Supervision/Assistance  Equipment Recommendations  BSC;Tub/shower seat    Recommendations for Other Services      Precautions / Restrictions Precautions Precautions: Fall Precaution Comments: orthostatic hypotension Restrictions Weight Bearing Restrictions: No       Mobility Bed Mobility Overal bed mobility: Needs Assistance Bed Mobility: Supine to Sit     Supine to sit: Min assist;HOB elevated     General bed mobility comments: required increased time and verbal cues for rail use and body mechanics     Transfers Overall transfer level: Needs assistance Equipment used: Quad cane Transfers: Sit to/from [facility] Sit to Stand: Min assist;+2 safety/equipment Stand pivot transfers: Min assist;+2 safety/equipment         General transfer comment: orignally requiring min assist +2 but progressed to 1     Balance Overall balance assessment: Needs assistance Sitting-balance support: Feet supported;Bilateral upper extremity supported Sitting balance-Leahy Scale: Fair Sitting balance - Comments: able to maintain balance sitting on eob   Standing balance support: Single extremity supported Standing balance-Leahy Scale: Poor Standing balance comment: unsafe standing balance with min guard for safety                           ADL either performed or assessed with clinical judgement   ADL Overall ADL's : Needs assistance/impaired     Grooming: Wash/dry hands;Wash/dry face;Min guard;Standing Grooming Details (indicate cue type and reason): performed grooming tasks standing from recliner Upper Body Bathing: Min guard;Standing Upper Body Bathing Details (indicate cue type and reason): bathed arms while standing         Lower Body Dressing: Maximal assistance Lower Body Dressing Details (indicate cue type and reason): attempted to doff socks but was unable             Functional mobility during ADLs: Minimal assistance;Cane General ADL Comments: some complaints of dizziness while standing     Vision       Perception     Praxis      Cognition Arousal/Alertness: Awake/alert Behavior During Therapy: Flat affect Overall Cognitive Status: Impaired/Different from baseline Area of Impairment: Memory;Problem solving;Safety/judgement  Orientation Level: Disoriented to;Situation Current Attention Level: Sustained Memory: Decreased short-term memory Following Commands: Follows one step commands with increased  time Safety/Judgement: Decreased awareness of safety Awareness: Intellectual Problem Solving: Slow processing General Comments: more awake today and able to follow directions          Exercises     Shoulder Instructions       General Comments      Pertinent Vitals/ Pain       Pain Assessment: No/denies pain  Home Living                                          Prior Functioning/Environment              Frequency  Min 2X/week        Progress Toward Goals  OT Goals(current goals can now be found in the care plan section)  Progress towards OT goals: Progressing toward goals  Acute Rehab OT Goals Patient Stated Goal: get better OT Goal Formulation: With patient Time For Goal Achievement: 08/15/21 Potential to Achieve Goals: Fair ADL Goals Pt Will Perform Grooming: with modified independence;standing Pt Will Perform Lower Body Bathing: with modified independence;sit to/from stand Pt Will Perform Lower Body Dressing: with modified independence;sit to/from stand Pt Will Transfer to Toilet: with modified independence;ambulating;regular height toilet Pt Will Perform Toileting - Clothing Manipulation and hygiene: with modified independence;sit to/from stand  Plan Discharge plan needs to be updated    Co-evaluation    PT/OT/SLP Co-Evaluation/Treatment: Yes Reason for Co-Treatment: For patient/therapist safety   OT goals addressed during session: ADL's and self-care      AM-PAC OT "6 Clicks" Daily Activity     Outcome Measure   Help from another person eating meals?: A Lot Help from another person taking care of personal grooming?: A Little Help from another person toileting, which includes using toliet, bedpan, or urinal?: A Lot Help from another person bathing (including washing, rinsing, drying)?: Total Help from another person to put on and taking off regular upper body clothing?: A Lot Help from another person to put on and taking off  regular lower body clothing?: Total 6 Click Score: 11    End of Session Equipment Utilized During Treatment:  (quad can)  OT Visit Diagnosis: Unsteadiness on feet (R26.81);Dizziness and giddiness (R42);Muscle weakness (generalized) (M62.81);Other symptoms and signs involving cognitive function   Activity Tolerance Patient tolerated treatment well   Patient Left in chair;with call bell/phone within reach;with chair alarm set   Nurse Communication Mobility status        Time: 1341-1415 OT Time Calculation (min): 34 min  Charges: OT General Charges $OT Visit: 1 Visit OT Treatments $Self Care/Home Management : 8-22 mins  Lodema Hong, [place]  Pager [phone] Office [place] 08/06/2021, 2:37 PM

## 2021-08-06 NOTE — NC FL2 (Signed)
[place] LEVEL OF CARE SCREENING TOOL     IDENTIFICATION  Patient Name: Jeffrey [name] Birthdate: 1966/08/03 Sex: male Admission Date (Current Location): 07/29/2021  [facility] and [place] Number:  Herbalist and Address:  [facility], [place] [address] [place], [place] [address]      Provider Number: [id number]  Attending Physician Name and Address:  Lenoria Chime, MD  Relative Name and Phone Number:       Current Level of Care: Hospital Recommended Level of Care: [place] Prior Approval Number:    Date Approved/Denied:   PASRR Number: [id number]  Discharge Plan: SNF    Current Diagnoses: Patient Active Problem List   Diagnosis Date Noted   Hypoalbuminemia    Recurrent falls 07/29/2021   Grief counseling 01/04/2021   Incontinence 12/23/2020   Hospital discharge follow-up 11/24/2020   Syncope 11/17/2020   Hypomagnesemia 11/05/2020   Hematemesis with nausea    Alcohol abuse with intoxication ([place])    Duodenal ulcer hemorrhagic    GI bleed 10/31/2020   Hyperglycemia 10/31/2020   DKA (diabetic ketoacidosis) ([place]) 07/22/2020   Candidiasis 07/22/2020   Hyponatremia 06/28/2020   Eyebrow laceration, left, initial encounter    Hypokalemia    Hyperlipidemia associated with type 2 diabetes mellitus (HCC)    MGUS (monoclonal gammopathy of unknown significance) 05/31/2020   Seizure disorder ([place]) 03/16/2020   Type 2 diabetes mellitus with hyperglycemia, with long-term current use of insulin ([place]) 03/16/2020   Tremor 03/16/2020   Colon cancer screening 02/22/2020   Stage 3a chronic kidney disease ([place]) 01/29/2020   Hyperphosphatemia 01/29/2020   Major depressive disorder, recurrent episode ([place]) 01/13/2018   Weakness    Hypotension 07/27/2017   Seizure ([place]) 04/08/2017   Proteinuria 11/23/2015   Essential hypertension    Cirrhosis ([place]) 03/22/2015   Ataxia 03/20/2015   Elevated liver enzymes     Dysarthria 03/09/2015   AKI (acute kidney injury) ([place])    Acute blood loss anemia 11/17/2014   ETOH abuse    Hyperkalemia    Tachycardia    Melena 10/10/2014   Erosive esophagitis 10/10/2014   Somnolence 10/08/2014   Acute kidney injury ([place])    OSA on CPAP 12/28/2012   Hypoglycemia 12/07/2012   Cardiac arrest ([place]) 12/07/2012   Anoxic brain injury ([place]) 12/07/2012   ERECTILE DYSFUNCTION 04/14/2007   Type 2 diabetes mellitus with diabetic neuropathy, with long-term current use of insulin ([place]) 12/04/2006   HYPERCHOLESTEROLEMIA 12/04/2006    Orientation RESPIRATION BLADDER Height & Weight     Self, Situation, Place  Normal Incontinent, External catheter Weight: 248 lb (112.5 kg) Height:  5\' 10"  (177.8 cm)  BEHAVIORAL SYMPTOMS/MOOD NEUROLOGICAL BOWEL NUTRITION STATUS      Incontinent Diet (see dc summary)  AMBULATORY STATUS COMMUNICATION OF NEEDS Skin   Extensive Assist Verbally Normal                       Personal Care Assistance Level of Assistance  Bathing, Feeding, Dressing Bathing Assistance: Maximum assistance Feeding assistance: Limited assistance Dressing Assistance: Limited assistance     Functional Limitations Info             SPECIAL CARE FACTORS FREQUENCY  PT (By licensed PT), OT (By licensed OT)     PT Frequency: 5x/week OT Frequency: 5x/week            Contractures Contractures Info: Not present    Additional Factors Info  Code Status, Allergies, Insulin Sliding Scale Code Status Info: Full Allergies Info: Lisinopril, Atorvastatin, Drug Ingredient (Spironolactone)   Insulin Sliding Scale Info: See DC Summary       Current Medications (08/06/2021):  This is the current hospital active medication list Current Facility-Administered Medications  Medication Dose Route Frequency Provider Last Rate Last Admin   aspirin EC tablet 81 mg  81 mg Oral QHS Corky Sox, MD   81 mg at 08/05/21 2108   atorvastatin (LIPITOR) tablet 80 mg  80  mg Oral QHS Corky Sox, MD   80 mg at 08/05/21 2108   dapagliflozin propanediol (FARXIGA) tablet 10 mg  10 mg Oral Daily Alcus Dad, MD   10 mg at 08/06/21 1035   dextrose 50 % solution 50 mL  1 ampule Intravenous PRN Brimage, Vondra, DO       enoxaparin (LOVENOX) injection 40 mg  40 mg Subcutaneous Daily Corky Sox, MD   40 mg at 08/06/21 1036   famotidine (PEPCID) tablet 20 mg  20 mg Oral Daily Dameron, Luna Fuse, DO   20 mg at 08/06/21 1034   feeding supplement (BOOST / RESOURCE BREEZE) liquid 1 Container  1 Container Oral TID BM Brimage, Vondra, DO   1 Container at 08/06/21 1335   fidaxomicin (DIFICID) tablet 200 mg  200 mg Oral BID Orvis Brill, DO   200 mg at 96/22/29 7989   folic acid (FOLVITE) tablet 1 mg  1 mg Oral Daily Corky Sox, MD   1 mg at 08/06/21 1035   gabapentin (NEURONTIN) capsule 300 mg  300 mg Oral TID Alcus Dad, MD   300 mg at 08/06/21 1336   insulin aspart (novoLOG) injection 0-6 Units  0-6 Units Subcutaneous TID WC Dameron, Luna Fuse, DO   2 Units at 08/06/21 1742   insulin glargine-yfgn (SEMGLEE) injection 6 Units  6 Units Subcutaneous Daily Alcus Dad, MD   6 Units at 08/06/21 1444   levETIRAcetam (KEPPRA) tablet 1,000 mg  1,000 mg Oral BID Corky Sox, MD   1,000 mg at 08/06/21 1034   multivitamin with minerals tablet 1 tablet  1 tablet Oral Daily Corky Sox, MD   1 tablet at 08/06/21 1035   thiamine tablet 100 mg  100 mg Oral Daily Corky Sox, MD   100 mg at 08/06/21 1035     Discharge Medications: Please see discharge summary for a list of discharge medications.  Relevant Imaging Results:  Relevant Lab Results:   Additional Information SSN: [id number]. Moderna COVID-19 Vaccine 01/21/2020 , 12/24/2019  Benard Halsted, LCSW

## 2021-08-06 NOTE — Progress Notes (Signed)
Physical Therapy Treatment Patient Details Name: Jeffrey [name] MRN: [id number] DOB: 1966-05-12 Today's Date: 08/06/2021   History of Present Illness Pt is a 55 y.o. F who presents 07/29/2021 with a fall and multiple electrolyte abnormalities, found to be C. Dif. +, receiving Fidaxomicin. 10/26 developed tremor and confusion (?DTs vs withdrawal from neurontin)  Significant PMH: alcohol use disorder, erosive espophagitis, seizure disorder, PEA with cardiac arrest in 2014 requiring prolonged intubation.    PT Comments    Patient cognitively much better today, although still with decreased insights into his deficits and slow processing of instructions/commands. Was able to progress to walking 12 ft in room with 2 person assist for safety due to his legs were very shaky. Order for PT to complete orthostatic vitals was not seen until after session and MD was messaged that these were not done during PT session this date. Recommended can put in a nursing order for orthostatic vital signs. PT/OT plan to try to alternate days so that pt is seen by at least one therapy each day. Continue to recommend SNF at this time.     Recommendations for follow up therapy are one component of a multi-disciplinary discharge planning process, led by the attending physician.  Recommendations may be updated based on patient status, additional functional criteria and insurance authorization.  Follow Up Recommendations  Skilled nursing-short term rehab (<3 hours/day)     Assistance Recommended at Discharge Frequent or constant Supervision/Assistance  Equipment Recommendations  Rolling walker (2 wheels);3in1 (PT)    Recommendations for Other Services       Precautions / Restrictions Precautions Precautions: Fall Precaution Comments: orthostatic hypotension Restrictions Weight Bearing Restrictions: No     Mobility  Bed Mobility Overal bed mobility: Needs Assistance Bed Mobility: Supine to Sit Rolling: +2  for safety/equipment;Max assist   Supine to sit: Min assist;HOB elevated     General bed mobility comments: required increased time and verbal cues for rail use and body mechanics    Transfers Overall transfer level: Needs assistance Equipment used: Quad cane Transfers: Sit to/from [facility] Sit to Stand: Min assist;+2 safety/equipment Stand pivot transfers: Min assist;+2 safety/equipment         General transfer comment: orignally requiring min assist +2 but progressed to 1    Ambulation/Gait Ambulation/Gait assistance: +2 physical assistance;Min assist Gait Distance (Feet): 12 Feet Assistive device: Quad cane;1 person hand held assist Gait Pattern/deviations: Step-to pattern;Decreased step length - right;Decreased step length - left;Wide base of support Gait velocity: decr   General Gait Details: Assist for balance; especially as turning   Stairs             Wheelchair Mobility    Modified Rankin (Stroke Patients Only)       Balance Overall balance assessment: Needs assistance Sitting-balance support: Feet supported;Bilateral upper extremity supported Sitting balance-Leahy Scale: Fair Sitting balance - Comments: able to maintain balance sitting on eob   Standing balance support: Single extremity supported Standing balance-Leahy Scale: Poor Standing balance comment: unsafe standing balance with min guard for safety                            Cognition Arousal/Alertness: Awake/alert Behavior During Therapy: Flat affect Overall Cognitive Status: Impaired/Different from baseline Area of Impairment: Memory;Problem solving;Safety/judgement                 Orientation Level: Disoriented to;Situation Current Attention Level: Sustained Memory: Decreased short-term memory Following Commands: Follows  one step commands with increased time Safety/Judgement: Decreased awareness of safety Awareness: Intellectual Problem  Solving: Slow processing General Comments: more awake today and able to follow directions        Exercises Other Exercises Other Exercises: sit to stand x 4 reps throughout session    General Comments General comments (skin integrity, edema, etc.): co-treat with OT; see their note for ADL tasks during session      Pertinent Vitals/Pain Pain Assessment: No/denies pain Faces Pain Scale: No hurt    Home Living                          Prior Function            PT Goals (current goals can now be found in the care plan section) Acute Rehab PT Goals Patient Stated Goal: continue OPPT PT Goal Formulation: With patient Time For Goal Achievement: 08/14/21 Potential to Achieve Goals: Fair Progress towards PT goals: Progressing toward goals    Frequency    Min 3X/week (?progress to dc home)      PT Plan Current plan remains appropriate    Co-evaluation PT/OT/SLP Co-Evaluation/Treatment: Yes Reason for Co-Treatment: Complexity of the patient's impairments (multi-system involvement);For patient/therapist safety;To address functional/ADL transfers PT goals addressed during session: Mobility/safety with mobility;Balance;Proper use of DME;Strengthening/ROM OT goals addressed during session: ADL's and self-care      AM-PAC PT "6 Clicks" Mobility   Outcome Measure  Help needed turning from your back to your side while in a flat bed without using bedrails?: Total Help needed moving from lying on your back to sitting on the side of a flat bed without using bedrails?: Total Help needed moving to and from a bed to a chair (including a wheelchair)?: Total Help needed standing up from a chair using your arms (e.g., wheelchair or bedside chair)?: Total Help needed to walk in hospital room?: Total Help needed climbing 3-5 steps with a railing? : Total 6 Click Score: 6    End of Session   Activity Tolerance: Patient tolerated treatment well Patient left: with call  bell/phone within reach;in chair;with chair alarm set Nurse Communication: Mobility status PT Visit Diagnosis: Unsteadiness on feet (R26.81);Other abnormalities of gait and mobility (R26.89);Muscle weakness (generalized) (M62.81)     Time: 5056-9794 PT Time Calculation (min) (ACUTE ONLY): 34 min  Charges:  $Gait Training: 8-22 mins                      Arby Barrette, PT Acute Rehabilitation Services  Pager [phone] Office [phone]    Rexanne Mano 08/06/2021, 2:53 PM

## 2021-08-06 NOTE — Progress Notes (Signed)
Spoke with pt regarding discharge planning as PT recommends SNF. Pt states he is agreeable to this and wants to get stronger before going home.

## 2021-08-06 NOTE — Progress Notes (Signed)
FPTS Brief Progress Note  S:Having some central abdominal pain. Pt resting comfortably.    O: BP (!) 123/95 (BP Location: Left Arm)   Pulse 72   Temp 98.6 F (37 C) (Oral)   Resp 18   Ht 5\' 10"  (1.778 m)   Wt 112.5 kg   SpO2 98%   BMI 35.58 kg/m    ABD: central and epigastric abdominal tenderness,   A/P:  - Orders reviewed. Labs for AM ordered, which was adjusted as needed.    Lyndee Hensen, DO 08/06/2021, 1:28 AM PGY-3, [facility] Night Resident  Please page [phone] with questions.

## 2021-08-07 ENCOUNTER — Encounter (HOSPITAL_COMMUNITY): Payer: Self-pay | Admitting: Family Medicine

## 2021-08-07 LAB — CBC WITH DIFFERENTIAL/PLATELET
Abs Immature Granulocytes: 0.05 10*3/uL (ref 0.00–0.07)
Basophils Absolute: 0.1 10*3/uL (ref 0.0–0.1)
Basophils Relative: 1 %
Eosinophils Absolute: 0 10*3/uL (ref 0.0–0.5)
Eosinophils Relative: 1 %
HCT: 27.9 % — ABNORMAL LOW (ref 39.0–52.0)
Hemoglobin: 9.2 g/dL — ABNORMAL LOW (ref 13.0–17.0)
Immature Granulocytes: 1 %
Lymphocytes Relative: 25 %
Lymphs Abs: 1.6 10*3/uL (ref 0.7–4.0)
MCH: 30.3 pg (ref 26.0–34.0)
MCHC: 33 g/dL (ref 30.0–36.0)
MCV: 91.8 fL (ref 80.0–100.0)
Monocytes Absolute: 1 10*3/uL (ref 0.1–1.0)
Monocytes Relative: 16 %
Neutro Abs: 3.6 10*3/uL (ref 1.7–7.7)
Neutrophils Relative %: 56 %
Platelets: 143 10*3/uL — ABNORMAL LOW (ref 150–400)
RBC: 3.04 MIL/uL — ABNORMAL LOW (ref 4.22–5.81)
RDW: 19.7 % — ABNORMAL HIGH (ref 11.5–15.5)
WBC: 6.4 10*3/uL (ref 4.0–10.5)
nRBC: 0.3 % — ABNORMAL HIGH (ref 0.0–0.2)

## 2021-08-07 LAB — BASIC METABOLIC PANEL
Anion gap: 6 (ref 5–15)
BUN: 9 mg/dL (ref 6–20)
CO2: 25 mmol/L (ref 22–32)
Calcium: 8.1 mg/dL — ABNORMAL LOW (ref 8.9–10.3)
Chloride: 107 mmol/L (ref 98–111)
Creatinine, Ser: 1.23 mg/dL (ref 0.61–1.24)
GFR, Estimated: >60 mL/min (ref >60)
Glucose, Bld: 133 mg/dL — ABNORMAL HIGH (ref 70–99)
Potassium: 4.5 mmol/L (ref 3.5–5.1)
Sodium: 138 mmol/L (ref 135–145)

## 2021-08-07 LAB — GLUCOSE, CAPILLARY
Glucose-Capillary: 78 mg/dL (ref 70–99)
Glucose-Capillary: 204 mg/dL — ABNORMAL HIGH (ref 70–99)
Glucose-Capillary: 338 mg/dL — ABNORMAL HIGH (ref 70–99)
Glucose-Capillary: 275 mg/dL — ABNORMAL HIGH (ref 70–99)

## 2021-08-07 MED ORDER — INSULIN GLARGINE-YFGN 100 UNIT/ML ~~LOC~~ SOLN
4.0000 [IU] | Freq: Every day | SUBCUTANEOUS | Status: DC
  Administered 2021-08-07 – 2021-08-08 (×2): 4 [IU] via SUBCUTANEOUS
  Filled 2021-08-07 (×2): qty 0.04

## 2021-08-07 NOTE — Progress Notes (Signed)
Bladder scan indicates 245ml. Pt denies urge to void. Will inform oncoming nurse to monitor.

## 2021-08-07 NOTE — Progress Notes (Addendum)
Family Medicine Teaching Service Daily Progress Note Intern Pager: [phone]  Patient name: Jeffrey [name] Medical record number: [id number] Date of birth: 06-Jun-1966 Age: 55 y.o. Gender: male  Primary Care Provider: [name], MD Consultants: neurology (signed off) Code Status: FULL  Pt Overview and Major Events to Date:  10/23: Admitted, C. difficile positive 10/27: Developed AMS 10/31: AMS resolved  Assessment and Plan: Zephan is a 55 year old male with PMH of alcohol use disorder, seizure disorder, PEA arrest c/b anoxic brain injury, T2DM, hypertension, and HFpEF who presented with multiple falls and severe electrolyte derangements, found to be C. difficile positive on admission. His hospital course has been complicated by altered mental status.  Altered mental status Patient A&O x4; improved mental status.  Etiology of AMS likely combination of alcohol withdrawal, metabolic encephalopathy, and gabapentin withdrawal. - Neurology consulted, signed off - Continue gabapentin 300 mg 3 times daily - Continue thiamine and folic acid - PT/OT recommend SNF placement with rolling walker and acute OT to continue following during admission  Nonfulminant, nonsevere C. Diff infection Patient denies diarrhea and reports normally formed stools.  No leukocytosis - Continue fidaxomicin, day 9 out of 10 - Enteric precautions  Elevated creatinine CR 1.23 this a.m., decreased from 1.4.  Baseline variable from 1-1.5  T2DM Fasting CBG 78 this a.m. CBGs from 231-286 over the past 24 hours.  Home regimen-Lantus 20 units daily Received 2u, 3u, and 2u with meals over past 24 hrs, respectively. -Semglee dec to 4 units - Continue SSI - Continue home Farxiga 10 mg daily  Electrolyte abnormalities, improving - Ca 8.1, otherwise WNL -Continue daily BMP  QT prolongation (resolved) EKG with QTC 495 (484 yday) this morning.  Anemia Hgb 9.2 (down from 10.6); patient asymptomatic at this  time -OP colonoscopy  Urinary Retention Bladder scan of 248 ml this AM (155 mL yday); patient denies urinary symptoms of retention at this time  Seizure Disorder, chronic/stable No seizure activity noted during hospitalization.  EEG without abnormalities on admission. -Continue Keppra 1000 mg BID -Seizure precautions   FEN/GI: Carb modified diet PPx: Lovenox Dispo: To skilled nursing short term rehab (<3 hours/day), possible discharge tomorrow after completion of fidaxomicin.  Subjective:  Patient alert and oriented x4 this morning.  He appears similarly with continued improvement in mental status.  He denies diarrhea, reporting bowel movements with formed stools.  He is agreeable to going to short-term rehabilitation, as he would be able to go home daily.  Objective: Temp:  [97.8 F (36.6 C)-98.5 F (36.9 C)] 98.4 F (36.9 C) (11/01 0410) Pulse Rate:  [71-82] 78 (11/01 0410) Resp:  [18] 18 (11/01 0410) BP: (84-153)/(58-100) 91/69 (11/01 0530) SpO2:  [97 %-99 %] 97 % (11/01 0410) Physical Exam: General: Obese, age congruent male in no acute distress. Cardiovascular: Regular rate and rhythm; no murmurs appreciated; no abnormal heart sounds appreciated Respiratory: Normal pulmonary effort, normal breath sounds Abdomen: Soft, nontender, nondistended Neurological: He is alert and oriented, no tremor noted  Laboratory: Recent Labs  Lab 08/05/21 1504 08/06/21 0748 08/07/21 0118  WBC 6.2 5.7 6.4  HGB 10.4* 10.6* 9.2*  HCT 30.7* 31.2* 27.9*  PLT 166 129* 143*   Recent Labs  Lab 08/03/21 0108 08/04/21 0151 08/05/21 0131 08/06/21 0748 08/07/21 0118  NA 134*   < > 135 136 138  K 4.8   < > 4.5 5.0 4.5  CL 104   < > 107 104 107  CO2 25   < > 25 28 25  BUN 7   < > 7 8 9   CREATININE 0.90   < > 1.04 1.40* 1.23  CALCIUM 6.6*   < > 7.1* 7.8* 8.1*  PROT 5.4*  --  4.5* 5.5*  --   BILITOT 1.5*  --  0.7 0.7  --   ALKPHOS 174*  --  197* 268*  --   ALT 41  --  36 39  --   AST  37  --  38 37  --   GLUCOSE 96   < > 126* 265* 133*   < > = values in this interval not displayed.   Imaging/Diagnostic Tests: None new  Rosezetta Schlatter, MD 08/07/2021, 7:36 AM PGY-1, [place] Intern pager: [phone], text pages welcome

## 2021-08-07 NOTE — Progress Notes (Signed)
Occupational Therapy Treatment Patient Details Name: Jeffrey [name] MRN: [id number] DOB: 05-03-1966 Today's Date: 08/07/2021   History of present illness Pt is a 55 y.o. F who presents 07/29/2021 with a fall and multiple electrolyte abnormalities, found to be C. Dif. +, receiving Fidaxomicin. 10/26 developed tremor and confusion (?DTs vs withdrawal from neurontin)  Significant PMH: alcohol use disorder, erosive espophagitis, seizure disorder, PEA with cardiac arrest in 2014 requiring prolonged intubation.   OT comments  Patient received in recliner and was apprehensive about participating with OT but agreed when told benefits of therapy. Patient required increased time to ambulate to sink and stood to perform grooming. Patient required seated rest break before returning to recliner. [name] which he transferred with min assist and performed toilet hygiene standing.  Patient is making good progress with OT treatment. Acute OT to continue to follow.    Recommendations for follow up therapy are one component of a multi-disciplinary discharge planning process, led by the attending physician.  Recommendations may be updated based on patient status, additional functional criteria and insurance authorization.    Follow Up Recommendations  Skilled nursing-short term rehab (<3 hours/day)    Assistance Recommended at Discharge Frequent or constant Supervision/Assistance  Equipment Recommendations  BSC;Tub/shower seat    Recommendations for Other Services      Precautions / Restrictions Precautions Precautions: Fall Precaution Comments: orthostatic hypotension Restrictions Weight Bearing Restrictions: No       Mobility Bed Mobility               General bed mobility comments: OOB in recliner    Transfers Overall transfer level: Needs assistance Equipment used: Quad cane Transfers: Sit to/from [facility] Sit to Stand: Min  assist Stand pivot transfers: Min assist         General transfer comment: min assist to power up to stand and min assist for transfer for safety     Balance Overall balance assessment: Needs assistance Sitting-balance support: Feet supported;Bilateral upper extremity supported Sitting balance-Leahy Scale: Fair Sitting balance - Comments: able to maintain balance sitting on eob   Standing balance support: Single extremity supported Standing balance-Leahy Scale: Poor Standing balance comment: performed grooming tasks standing at sink                           ADL either performed or assessed with clinical judgement   ADL Overall ADL's : Needs assistance/impaired     Grooming: Wash/dry hands;Wash/dry face;Oral care;Min guard;Standing Grooming Details (indicate cue type and reason): performed standing at sink                 Toilet Transfer: Minimal Scientist, forensic Details (indicate cue type and reason): performed transfer from [place] and Hygiene: Min guard;Sit to/from stand Toileting - Clothing Manipulation Details (indicate cue type and reason): performed toilet hygiene standing     Functional mobility during ADLs: Minimal assistance;Cane General ADL Comments: performed grooming standing at sink and required increased time with seated rest break before walking back to recliner     Vision       Perception     Praxis      Cognition Arousal/Alertness: Awake/alert Behavior During Therapy: Flat affect Overall Cognitive Status: Impaired/Different from baseline Area of Impairment: Memory;Problem solving;Safety/judgement                   Current Attention Level: Sustained Memory:  Decreased short-term memory Following Commands: Follows one step commands with increased time Safety/Judgement: Decreased awareness of safety Awareness: Intellectual Problem Solving: Slow processing General  Comments: continues to be slow processing and increased time to follow commands          Exercises     Shoulder Instructions       General Comments      Pertinent Vitals/ Pain       Faces Pain Scale: No hurt  Home Living                                          Prior Functioning/Environment              Frequency  Min 2X/week        Progress Toward Goals  OT Goals(current goals can now be found in the care plan section)  Progress towards OT goals: Progressing toward goals  Acute Rehab OT Goals Patient Stated Goal: "go home to my kids" OT Goal Formulation: With patient Time For Goal Achievement: 08/15/21 Potential to Achieve Goals: Fair ADL Goals Pt Will Perform Grooming: with modified independence;standing Pt Will Perform Lower Body Bathing: with modified independence;sit to/from stand Pt Will Perform Lower Body Dressing: with modified independence;sit to/from stand Pt Will Transfer to Toilet: with modified independence;ambulating;regular height toilet Pt Will Perform Toileting - Clothing Manipulation and hygiene: with modified independence;sit to/from stand  Plan Discharge plan needs to be updated    Co-evaluation                 AM-PAC OT "6 Clicks" Daily Activity     Outcome Measure   Help from another person eating meals?: A Lot Help from another person taking care of personal grooming?: A Little Help from another person toileting, which includes using toliet, bedpan, or urinal?: A Little Help from another person bathing (including washing, rinsing, drying)?: A Lot Help from another person to put on and taking off regular upper body clothing?: A Little Help from another person to put on and taking off regular lower body clothing?: A Lot 6 Click Score: 15    End of Session Equipment Utilized During Treatment: Gait belt;Other (comment) (quad cane)  OT Visit Diagnosis: Unsteadiness on feet (R26.81);Dizziness and giddiness  (R42);Muscle weakness (generalized) (M62.81);Other symptoms and signs involving cognitive function   Activity Tolerance Patient tolerated treatment well   Patient Left in chair;with call bell/phone within reach;with chair alarm set   Nurse Communication Mobility status        Time: 4765-4650 OT Time Calculation (min): 42 min  Charges: OT General Charges $OT Visit: 1 Visit OT Treatments $Self Care/Home Management : 38-52 mins  Lodema Hong, [place]  Pager [phone] Office [place] 08/07/2021, 2:22 PM

## 2021-08-07 NOTE — Progress Notes (Signed)
Inpatient Diabetes Program Recommendations  AACE/ADA: New Consensus Statement on Inpatient Glycemic Control (2015)  Target Ranges:  Prepandial:   less than 140 mg/dL      Peak postprandial:   less than 180 mg/dL (1-2 hours)      Critically ill patients:  140 - 180 mg/dL   Lab Results  Component Value Date   GLUCAP 78 08/07/2021   HGBA1C 5.7 (H) 07/31/2021    Review of Glycemic Control Results for CORDERRO, KOLOSKI (MRN [id number]) as of 08/07/2021 10:33  Ref. Range 08/06/2021 17:35 08/06/2021 20:24 08/07/2021 08:29  Glucose-Capillary Latest Ref Range: 70 - 99 mg/dL 249 (H) 231 (H) 78   Diabetes history: DM2 Outpatient Diabetes medications: Lantus 20 units, Farxiga 10 qd, Metformin 500 mg bid Current orders for Inpatient glycemic control: Novolog 0-6 units TID, Semglee 4 units QD, Farxiga 10 mg qd   Inpatient Diabetes Program Recommendations:  If post prandials continue to exceed 180's mg/dL, may want to consider alternative nutritional supplement (Boost Breeze- ~54 g CHO)?  Thanks, Bronson Curb, MSN, RNC-OB Diabetes Coordinator [phone] (8a-5p)

## 2021-08-07 NOTE — Progress Notes (Signed)
FPTS Interim Progress Note  S:Patient sleeping comfortably.  O: BP 104/75 (BP Location: Left Arm)   Pulse 82   Temp 98.4 F (36.9 C) (Axillary)   Resp 18   Ht 5\' 10"  (1.778 m)   Wt 112.5 kg   SpO2 98%   BMI 35.58 kg/m   General: Sleeping Resp: Equal chest rise and fall  A/P: Receiving Fidaxomicin treatment for C dif. AMS workup unrevealing, continuing Gabapentin.  T2DM: CBG 231 - Continue SSI - Continue Semglee 6u  Continue other management per day team  Labs and orders reviewed. No new changes.  Orvis Brill, DO 08/07/2021, 12:52 AM PGY-1, [facility] Service pager [phone]

## 2021-08-07 NOTE — TOC Progression Note (Signed)
Transition of Care [facility]) - Progression Note    Patient Details  Name: Jeffrey [name] MRN: [id number] Date of Birth: 12/09/1965  Transition of Care [facility]) CM/SW [place], LCSW Phone Number: 08/07/2021, 9:22 AM  Clinical Narrative:    Patent with no bed offers at this time. CSW will continue to search for facilities.    Expected Discharge Plan: Skilled Nursing Facility Barriers to Discharge: SNF Pending bed offer, Insurance Authorization  Expected Discharge Plan and Services Expected Discharge Plan: [place] In-house Referral: Clinical Social Work Discharge Planning Services: CM Consult   Living arrangements for the past 2 months: [place] Determinants of Health (SDOH) Interventions    Readmission Risk Interventions Readmission Risk Prevention Plan 08/01/2021  Transportation Screening Complete  PCP or Specialist Appt within 5-7 Days Complete  Home Care Screening Patient refused  Medication Review (RN CM) Complete  Some recent data might be hidden

## 2021-08-07 NOTE — TOC Progression Note (Signed)
Transition of Care [facility]) - Progression Note    Patient Details  Name: Jeffrey [name] MRN: [id number] Date of Birth: 01/15/1966  Transition of Care [facility]) CM/SW [place], LCSW Phone Number: 08/07/2021, 5:30 PM  Clinical Narrative:    CSW met with patient and provided him with the only two SNF bed options available. CSW provided space for him to lament the distance from [place] but that he knows he needs rehab prior to getting home. As he was stating that he missed his kids, two of his kids walked into the room to visit and patient expressed being extremely joyous. His kids encouraged him to go to SNF even though it would be farther away. Patient selected [facility]. CSW requested [facility] begin insurance authorization.    Expected Discharge Plan: Skilled Nursing Facility Barriers to Discharge: Insurance Authorization  Expected Discharge Plan and Services Expected Discharge Plan: [place] In-house Referral: Clinical Social Work Discharge Planning Services: CM Consult   Living arrangements for the past 2 months: [place] Determinants of Health (SDOH) Interventions    Readmission Risk Interventions Readmission Risk Prevention Plan 08/01/2021  Transportation Screening Complete  PCP or Specialist Appt within 5-7 Days Complete  Home Care Screening Patient refused  Medication Review (RN CM) Complete  Some recent data might be hidden

## 2021-08-08 LAB — CBC
HCT: 31.4 % — ABNORMAL LOW (ref 39.0–52.0)
Hemoglobin: 10.5 g/dL — ABNORMAL LOW (ref 13.0–17.0)
MCH: 31.2 pg (ref 26.0–34.0)
MCHC: 33.4 g/dL (ref 30.0–36.0)
MCV: 93.2 fL (ref 80.0–100.0)
Platelets: 150 10*3/uL (ref 150–400)
RBC: 3.37 MIL/uL — ABNORMAL LOW (ref 4.22–5.81)
RDW: 19.8 % — ABNORMAL HIGH (ref 11.5–15.5)
WBC: 6.6 10*3/uL (ref 4.0–10.5)
nRBC: 0.3 % — ABNORMAL HIGH (ref 0.0–0.2)

## 2021-08-08 LAB — BASIC METABOLIC PANEL WITH GFR
Anion gap: 6 (ref 5–15)
BUN: 10 mg/dL (ref 6–20)
CO2: 25 mmol/L (ref 22–32)
Calcium: 8 mg/dL — ABNORMAL LOW (ref 8.9–10.3)
Chloride: 106 mmol/L (ref 98–111)
Creatinine, Ser: 1.21 mg/dL (ref 0.61–1.24)
GFR, Estimated: >60 mL/min
Glucose, Bld: 224 mg/dL — ABNORMAL HIGH (ref 70–99)
Potassium: 4.8 mmol/L (ref 3.5–5.1)
Sodium: 137 mmol/L (ref 135–145)

## 2021-08-08 LAB — GLUCOSE, CAPILLARY
Glucose-Capillary: 169 mg/dL — ABNORMAL HIGH (ref 70–99)
Glucose-Capillary: 319 mg/dL — ABNORMAL HIGH (ref 70–99)
Glucose-Capillary: 286 mg/dL — ABNORMAL HIGH (ref 70–99)
Glucose-Capillary: 137 mg/dL — ABNORMAL HIGH (ref 70–99)

## 2021-08-08 LAB — RESP PANEL BY RT-PCR (FLU A&B, COVID) ARPGX2
Influenza A by PCR: NEGATIVE
Influenza B by PCR: NEGATIVE
SARS Coronavirus 2 by RT PCR: NEGATIVE

## 2021-08-08 LAB — ALBUMIN: Albumin: <1.5 g/dL — ABNORMAL LOW (ref 3.5–5.0)

## 2021-08-08 MED ORDER — ENSURE MAX PROTEIN PO LIQD
11.0000 [oz_av] | Freq: Three times a day (TID) | ORAL | Status: DC
  Administered 2021-08-08 – 2021-08-10 (×7): 11 [oz_av] via ORAL
  Filled 2021-08-08 (×8): qty 330

## 2021-08-08 MED ORDER — INSULIN GLARGINE-YFGN 100 UNIT/ML ~~LOC~~ SOLN: 1.0000 [IU] | Freq: Once | SUBCUTANEOUS | Status: DC

## 2021-08-08 MED ORDER — INFLUENZA VAC SPLIT QUAD 0.5 ML IM SUSY
0.5000 mL | PREFILLED_SYRINGE | INTRAMUSCULAR | Status: AC
  Administered 2021-08-09: 0.5 mL via INTRAMUSCULAR
  Filled 2021-08-08: qty 0.5

## 2021-08-08 MED ORDER — INSULIN GLARGINE-YFGN 100 UNIT/ML ~~LOC~~ SOLN
5.0000 [IU] | Freq: Every day | SUBCUTANEOUS | Status: DC
  Administered 2021-08-09 – 2021-08-10 (×2): 5 [IU] via SUBCUTANEOUS
  Filled 2021-08-08 (×2): qty 0.05

## 2021-08-08 NOTE — Progress Notes (Signed)
FPTS Interim Progress Note  S:Patient seen at bedside on nighttime rounds. Sleeping comfortably.  O: BP 128/87 (BP Location: Left Arm)   Pulse 99   Temp 97.8 F (36.6 C) (Oral)   Resp 16   Ht 5\' 10"  (1.778 m)   Wt 112.5 kg   SpO2 99%   BMI 35.58 kg/m   General: Sleeping comfortably Resp: Normal chest rise and fall  A/P: - Continue current management per day team - Cleared for SNF, chose [facility]. Awaiting insurance auth.  Labs and orders reviewed. No changes. VSS.  Orvis Brill, DO 08/08/2021, 1:27 AM PGY-1, [place] [facility] Service pager [phone]

## 2021-08-08 NOTE — Progress Notes (Signed)
Inpatient Diabetes Program Recommendations  AACE/ADA: New Consensus Statement on Inpatient Glycemic Control  Target Ranges:  Prepandial:   less than 140 mg/dL      Peak postprandial:   less than 180 mg/dL (1-2 hours)      Critically ill patients:  140 - 180 mg/dL   Results for Jeffrey [name], Jeffrey [name] (MRN [id number]) as of 08/08/2021 08:59  Ref. Range 08/07/2021 08:29 08/07/2021 12:12 08/07/2021 16:06 08/07/2021 19:52 08/08/2021 07:43  Glucose-Capillary Latest Ref Range: 70 - 99 mg/dL 78 204 (H) 338 (H) 275 (H) 169 (H)   Review of Glycemic Control  Diabetes history: DM2 Outpatient Diabetes medications: Lantus 20 units daily,Metformin XR 500 mg BID, Farxiga 10 mg daily Current orders for Inpatient glycemic control: Semglee 4 units daily, Novolog 0-6 units TID with meals, Farxiga 10 mg daily  Inpatient Diabetes Program Recommendations:    Insulin: Post prandial glucose is consistently elevated. Please consider ordering Novolog 4 units TID with meals for meal coverage if patient eats at least 50% of meals. Also please order Novolog 0-5 units QHS for bedtime correction.   Supplement: May want to consider changing nutritional supplement to supplement with less carbohydrates (such as Ensure Max).  Thanks, Barnie Alderman, RN, MSN, CDE Diabetes Coordinator Inpatient Diabetes Program [phone] (Team Pager from 8am to 5pm)

## 2021-08-08 NOTE — Progress Notes (Signed)
Occupational Therapy Treatment Patient Details Name: Jeffrey [name] MRN: [id number] DOB: September 24, 1966 Today's Date: 08/08/2021   History of present illness Pt is a 55 y.o. F who presents 07/29/2021 with a fall and multiple electrolyte abnormalities, found to be C. Dif. +, receiving Fidaxomicin. 10/26 developed tremor and confusion (?DTs vs withdrawal from neurontin)  Significant PMH: alcohol use disorder, erosive espophagitis, seizure disorder, PEA with cardiac arrest in 2014 requiring prolonged intubation.   OT comments  Pt presented in bed and agreed to therapy. Pt reported they are agreeable for SNF stay. Pt at this time required min guard to min assist with bed mobility. Pt was able to complete stand pivot to chair but with increase in time and hand over hand with reaching. Pt then completed UE bathing and hygiene post set up to min guard. Pt took increase time as pt reporting just "trying to get bearings". Pt currently with functional limitations due to the deficits listed below (see OT Problem List).  Pt will benefit from skilled OT to increase their safety and independence with ADL and functional mobility for ADL to facilitate discharge to venue listed below.     Recommendations for follow up therapy are one component of a multi-disciplinary discharge planning process, led by the attending physician.  Recommendations may be updated based on patient status, additional functional criteria and insurance authorization.    Follow Up Recommendations  Skilled nursing-short term rehab (<3 hours/day)    Assistance Recommended at Discharge Frequent or constant Supervision/Assistance  Equipment Recommendations  BSC;Tub/shower seat    Recommendations for Other Services      Precautions / Restrictions Precautions Precautions: Fall Precaution Comments: orthostatic hypotension Restrictions Weight Bearing Restrictions: No       Mobility Bed Mobility Overal bed mobility: Needs Assistance Bed  Mobility: Supine to Sit Rolling: Min guard   Supine to sit: Min assist;HOB elevated          Transfers Overall transfer level: Needs assistance Equipment used: Quad cane Transfers: Sit to/from [employer] to Stand: Min assist Stand pivot transfers: Min assist         General transfer comment: to use hand over hand  to assist in transfer placement     Balance Overall balance assessment: Needs assistance Sitting-balance support: Feet supported;Bilateral upper extremity supported Sitting balance-Leahy Scale: Fair Sitting balance - Comments: able to maintain balance sitting on eob                                   ADL either performed or assessed with clinical judgement   ADL Overall ADL's : Needs assistance/impaired Eating/Feeding: Minimal assistance;Set up;Sitting   Grooming: Wash/dry hands;Wash/dry face;Set up;Sitting   Upper Body Bathing: Min guard;Cueing for safety;Cueing for sequencing;Sitting   Lower Body Bathing: Cueing for safety;Cueing for sequencing;Sit to/from stand;Minimal assistance   Upper Body Dressing : Set up;Cueing for safety;Cueing for sequencing;Sitting   Lower Body Dressing: Moderate assistance;Cueing for safety;Cueing for sequencing   Toilet Transfer: Minimal assistance;Cueing for safety;Cueing for sequencing;Stand-pivot;BSC   Toileting- Clothing Manipulation and Hygiene: Cueing for safety;Cueing for sequencing;Sit to/from stand;Minimal assistance               Vision       Perception Perception Perception: Not tested   Praxis Praxis Praxis: Not tested    Cognition Arousal/Alertness: Awake/alert Behavior During Therapy: Flat affect Overall Cognitive Status: Impaired/Different from baseline Area of Impairment: Safety/judgement  Following Commands: Follows one step commands with increased time     Problem Solving: Slow processing General Comments: Pt noted to  place deoderant across his chest when applying          Exercises     Shoulder Instructions       General Comments      Pertinent Vitals/ Pain       Pain Assessment: No/denies pain Pain Score: 0-No pain Pain Intervention(s): Monitored during session  Home Living                                          Prior Functioning/Environment              Frequency  Min 2X/week        Progress Toward Goals  OT Goals(current goals can now be found in the care plan section)  Progress towards OT goals: Progressing toward goals  Acute Rehab OT Goals Patient Stated Goal: to feel better OT Goal Formulation: With patient Time For Goal Achievement: 08/15/21 Potential to Achieve Goals: Fair ADL Goals Pt Will Perform Grooming: with modified independence;standing Pt Will Perform Lower Body Bathing: with modified independence;sit to/from stand Pt Will Perform Lower Body Dressing: with modified independence;sit to/from stand Pt Will Transfer to Toilet: with modified independence;ambulating;regular height toilet Pt Will Perform Toileting - Clothing Manipulation and hygiene: with modified independence;sit to/from stand  Plan Discharge plan remains appropriate    Co-evaluation                 AM-PAC OT "6 Clicks" Daily Activity     Outcome Measure   Help from another person eating meals?: A Little Help from another person taking care of personal grooming?: A Little Help from another person toileting, which includes using toliet, bedpan, or urinal?: A Little Help from another person bathing (including washing, rinsing, drying)?: A Lot Help from another person to put on and taking off regular upper body clothing?: A Little Help from another person to put on and taking off regular lower body clothing?: A Lot 6 Click Score: 16    End of Session Equipment Utilized During Treatment: Gait belt  OT Visit Diagnosis: Unsteadiness on feet (R26.81);Dizziness and  giddiness (R42);Muscle weakness (generalized) (M62.81);Other symptoms and signs involving cognitive function   Activity Tolerance Patient tolerated treatment well   Patient Left in chair;with call bell/phone within reach;with chair alarm set   Nurse Communication Mobility status (medications)        Time: 7616-0737 OT Time Calculation (min): 30 min  Charges: OT General Charges $OT Visit: 1 Visit OT Treatments $Self Care/Home Management : 23-37 mins  Joeseph Amor OTR/L  Acute Rehab Services  [phone] office number [phone] pager number   Joeseph Amor 08/08/2021, 8:37 AM

## 2021-08-08 NOTE — Progress Notes (Signed)
Nutrition Follow-up  DOCUMENTATION CODES:  Not applicable  INTERVENTION:  Continue Boost Breeze TID.  Continue CIWA protocol.  Continue to encourage PO intake.  Obtain updated weight.  NUTRITION DIAGNOSIS:  Increased nutrient needs related to chronic illness (Cirrhosis) as evidenced by estimated needs.  GOAL:  Patient will meet greater than or equal to 90% of their needs  MONITOR:  PO intake, Weight trends, Supplement acceptance, I & O's  REASON FOR ASSESSMENT:  Consult Assessment of nutrition requirement/status  ASSESSMENT:  55 y.o. male presented ot the ED after a fall. PMH includes EtOH abuse, cirrhosis, T2DM, anoxic brain injury, and CKD IIIa. Pt admitted with syncope, AKI, and C.Diff +.  Medically stable for SNF.  Pt eating an average of ~68% over the past 8 meals (20-100%).  Pt remains at risk for malnutrition given history of EtOH abuse.  Pt needs new weight. RD to order.  Continue Boost Breeze TID and CIWA.  Supplements: Boost Breeze TID  Medications: reviewed; Pepcid, folic acid, SSI, Semglee, Keppra BID, MVI with minerals, thiamine  Labs: reviewed; CBG 169-338 (H)  NUTRITION - FOCUSED PHYSICAL EXAM: Flowsheet Row Most Recent Value  Orbital Region Mild depletion  Upper Arm Region No depletion  Thoracic and Lumbar Region No depletion  Buccal Region Mild depletion  Temple Region Mild depletion  Clavicle Bone Region No depletion  Clavicle and Acromion Bone Region No depletion  Scapular Bone Region No depletion  Dorsal Hand No depletion  Patellar Region No depletion  Anterior Thigh Region No depletion  Posterior Calf Region No depletion  Edema (RD Assessment) Mild  [BLE]  Hair Reviewed  Eyes Reviewed  Mouth Reviewed  Skin Reviewed  Nails Reviewed   Diet Order:   Diet Order             Diet Carb Modified Fluid consistency: Thin; Room service appropriate? No  Diet effective now                  EDUCATION NEEDS:  No education needs  have been identified at this time  Skin:  Skin Assessment: Reviewed RN Assessment  Last BM:  08/08/21 - Type 6, medium  Height:  Ht Readings from Last 1 Encounters:  07/29/21 5\' 10"  (1.778 m)   Weight:  Wt Readings from Last 1 Encounters:  07/29/21 112.5 kg   BMI:  Body mass index is 35.58 kg/m.  Estimated Nutritional Needs:  Kcal:  9937-1696 Protein:  115-130 grams Fluid:  >/= 2 L  Derrel Nip, RD, LDN (she/her/hers) Registered Dietitian I Pager #: [phone] After-Hours/Weekend Pager # in [place]

## 2021-08-08 NOTE — Progress Notes (Signed)
Physical Therapy Treatment Patient Details Name: Jeffrey [name] MRN: [id number] DOB: 1965/12/04 Today's Date: 08/08/2021   History of Present Illness Pt is a 55 y.o. F who presents 07/29/2021 with a fall and multiple electrolyte abnormalities, found to be C. Dif. +, receiving Fidaxomicin. 10/26 developed tremor and confusion (?DTs vs withdrawal from neurontin)  Significant PMH: alcohol use disorder, erosive espophagitis, seizure disorder, PEA with cardiac arrest in 2014 requiring prolonged intubation.    PT Comments    Patient up in chair finishing his bath with technician on arrival. Patient reporting fatigue and wanting to return to bed. Required +2 mod assist to stand from recliner (incr assist from previous session due to fatigue). Required +2 min assist for pivotal steps from recliner to bed. Patient participated in LE exercises in sitting and supine. Very motivated and eager to get to SNF for more therapy.    Recommendations for follow up therapy are one component of a multi-disciplinary discharge planning process, led by the attending physician.  Recommendations may be updated based on patient status, additional functional criteria and insurance authorization.  Follow Up Recommendations  Skilled nursing-short term rehab (<3 hours/day)     Assistance Recommended at Discharge Frequent or constant Supervision/Assistance  Equipment Recommendations  Rolling walker (2 wheels);3in1 (PT)    Recommendations for Other Services       Precautions / Restrictions Precautions Precautions: Fall Precaution Comments: orthostatic hypotension Restrictions Weight Bearing Restrictions: No     Mobility  Bed Mobility Overal bed mobility: Needs Assistance Bed Mobility: Sit to Sidelying Rolling: Min guard   Supine to sit: Min assist;HOB elevated   Sit to sidelying: Mod assist General bed mobility comments: OOB in recliner on arrival; returned to bed with assist to raise legs onto bed     Transfers Overall transfer level: Needs assistance Equipment used: Rolling walker (2 wheels) Transfers: Sit to/from Stand;Stand Pivot Transfers Sit to Stand: +2 safety/equipment;Mod assist Stand pivot transfers: Min assist;+2 safety/equipment         General transfer comment: pt fatigued from sitting in chair and earlier activity (walked to bathroom with nursing);  required 2 person assist to come to stand; +2 for safety during pivot with RW with step by step instructions/sequencing    Ambulation/Gait             General Gait Details: pt fatigued and barely able to lift feet to take pivotal steps from recliner to bed; ambulation unsafe at this time   Stairs             Wheelchair Mobility    Modified Rankin (Stroke Patients Only)       Balance Overall balance assessment: Needs assistance Sitting-balance support: Feet supported;Bilateral upper extremity supported Sitting balance-Leahy Scale: Fair Sitting balance - Comments: able to maintain balance sitting on eob   Standing balance support: Bilateral upper extremity supported;During functional activity;Reliant on assistive device for balance Standing balance-Leahy Scale: Poor                              Cognition Arousal/Alertness: Awake/alert Behavior During Therapy: Flat affect Overall Cognitive Status: Impaired/Different from baseline Area of Impairment: Attention;Following commands;Awareness;Problem solving                   Current Attention Level: Selective   Following Commands: Follows one step commands with increased time   Awareness: Emergent Problem Solving: Slow processing General Comments: Pt noted to place deoderant across his  chest when applying        Exercises General Exercises - Lower Extremity Long Arc Quad: Both;10 reps;Seated Hip ABduction/ADduction: AROM;Both;10 reps;Supine Hip Flexion/Marching: AROM;Both;5 reps;Seated Heel Raises: AROM;Both;10  reps;Seated    General Comments        Pertinent Vitals/Pain Pain Assessment: No/denies pain Pain Score: 0-No pain Faces Pain Scale: No hurt Pain Intervention(s): Monitored during session    Home Living                          Prior Function            PT Goals (current goals can now be found in the care plan section) Acute Rehab PT Goals Patient Stated Goal: continue OPPT PT Goal Formulation: With patient Time For Goal Achievement: 08/14/21 Potential to Achieve Goals: Fair Progress towards PT goals: Progressing toward goals    Frequency    Min 3X/week (?progress to dc home)      PT Plan Current plan remains appropriate    Co-evaluation              AM-PAC PT "6 Clicks" Mobility   Outcome Measure  Help needed turning from your back to your side while in a flat bed without using bedrails?: A Little Help needed moving from lying on your back to sitting on the side of a flat bed without using bedrails?: A Little Help needed moving to and from a bed to a chair (including a wheelchair)?: Total Help needed standing up from a chair using your arms (e.g., wheelchair or bedside chair)?: Total Help needed to walk in hospital room?: Total Help needed climbing 3-5 steps with a railing? : Total 6 Click Score: 10    End of Session Equipment Utilized During Treatment: Gait belt Activity Tolerance: Patient limited by fatigue Patient left: with call bell/phone within reach;in bed;with bed alarm set Nurse Communication: Mobility status PT Visit Diagnosis: Unsteadiness on feet (R26.81);Other abnormalities of gait and mobility (R26.89);Muscle weakness (generalized) (M62.81)     Time: 9485-4627 PT Time Calculation (min) (ACUTE ONLY): 19 min  Charges:  $Therapeutic Activity: 8-22 mins                      Jeffrey [name], PT Acute Rehabilitation Services  Pager [phone] Office [phone]    Jeffrey [name] 08/08/2021, 11:42 AM

## 2021-08-08 NOTE — TOC Progression Note (Signed)
Transition of Care [facility]) - Progression Note    Patient Details  Name: Jeffrey [name] MRN: [id number] Date of Birth: 1966-06-14  [facility]) CM/SW [place], LCSW Phone Number: 08/08/2021, 3:16 PM  Clinical Narrative:    [facility] still awaiting insurance approval.    Expected Discharge Plan: [place] Barriers to Discharge: Insurance Authorization  Expected Discharge Plan and Services Expected Discharge Plan: [place] In-house Referral: Clinical Social Work Discharge Planning Services: CM Consult   Living arrangements for the past 2 months: Single Family Home                                       Social Determinants of Health (SDOH) Interventions    Readmission Risk Interventions Readmission Risk Prevention Plan 08/01/2021  Transportation Screening Complete  PCP or Specialist Appt within 5-7 Days Complete  Home Care Screening Patient refused  Medication Review (RN CM) Complete  Some recent data might be hidden

## 2021-08-08 NOTE — Progress Notes (Signed)
Family Medicine Teaching Service Daily Progress Note Intern Pager: [phone]  Patient name: Jeffrey [name] Medical record number: [id number] Date of birth: 1966/03/20 Age: 55 y.o. Gender: male  Primary Care Provider: [name], MD Consultants: neurology (signed off) Code Status: FULL  Pt Overview and Major Events to Date:  10/23: Admitted, C. Difficile positive 10/27: Developed AMS 10/31: AMS resolved  Assessment and Plan: Jeffrey [name] is a 55- year-old M who presented with multiple falls and severe electrolyte derangements, found to be C. Difficile positive on admission; hospital course c/b AMS. PMH of alcohol use disorder, seizure disorder, PEA c/b an anoxic brain injury, T2DM, HTN, and HFpEF.  Non-fulminant, non-severe C.Diff infection Patient denies diarrhea and reports normally formed stools. No leukocytosis -Fidaxomicin complete -Enteric precautions to be continued; will reassess with completion of treatment regimen to determine necessity.  Altered Mental Status, resolved Patient A&O x 4; improved mental status. Etiology of AMS likely combination of alcohol withdrawal, metabolic encephalopathy, and gabapentin withdrawal. - Neurology consulted, signed off -Continue gabapentin 300 mg TID - Continue thiamine and folic acid -PT/OT recommend SNF placement with rolling walker; acute OT to continue to follow during admission; SN short-term rehab pending insurance auth.  Elevated Creatinine, resolved Cr 1.21 (1.23). Baseline variable from 1.5. -BMP stable; will hold off tomorrow.  T2DM Fasting CBG 169 this AM; CBGs from 204-338 over past 24 hours. Home regimen- Lantus 20u daily. Received total of 6u SSI over past 24 hours. -Increase Semglee to 5 units - Change Boost to Ensure Max, per diabetic coordinator rec (less carbohydrates)  -Continue SSI -Continue home Farxiga 10 mg daily  Electrolyte abnormalities, resolved -Ca 8.1, unchanged -D/c daily BMP d/t stability.    Anemia Hgb 10.5 (inc from 9.2); patient asymptomatic -OP colonoscopy recommended  Urinary Retention Bladder scan of 248 ml on 11/2 (155 mL 11/1); patient denies urinary symptoms of retention at this time   Seizure Disorder, chronic/stable No seizure activity noted during hospitalization.  EEG without abnormalities on admission. -Continue Keppra 1000 mg BID -Seizure precautions   FEN/GI: Carb modified diet PPx: Lovenox Dispo:To skilled nursing short term rehab (<3 hours/day), possible discharge after completion of fidaxomicin, pending insurance auth.  Subjective:  Patient alert and oriented x4 this morning.  He appears to be at his baseline mental status.  He again denies diarrhea, reporting bowel movements with formed stools.  He is agreeable to going to short-term rehabilitation, at [facility] and understands we are awaiting insurance authorization.  Objective: Temp:  [97.6 F (36.4 C)-98.1 F (36.7 C)] 97.6 F (36.4 C) (11/02 0400) Pulse Rate:  [84-103] 84 (11/02 0400) Resp:  [16-18] 16 (11/02 0400) BP: (99-170)/(72-119) 110/72 (11/02 0400) SpO2:  [96 %-100 %] 99 % (11/02 0400) Physical Exam: General: Obese, 55 congruent male in no acute distress. Cardiovascular: Regular rate and rhythm; no murmurs appreciated; no abnormal heart sounds appreciated Respiratory: Normal pulmonary effort, normal breath sounds Abdomen: Soft, nontender, nondistended Neurological: He is alert and oriented, no tremor noted  Laboratory: Recent Labs  Lab 08/05/21 1504 08/06/21 0748 08/07/21 0118  WBC 6.2 5.7 6.4  HGB 10.4* 10.6* 9.2*  HCT 30.7* 31.2* 27.9*  PLT 166 129* 143*   Recent Labs  Lab 08/03/21 0108 08/04/21 0151 08/05/21 0131 08/06/21 0748 08/07/21 0118 08/08/21 0243  NA 134*   < > 135 136 138 137  K 4.8   < > 4.5 5.0 4.5 4.8  CL 104   < > 107 104 107 106  CO2 25   < >  25 28 25 25   BUN 7   < > 7 8 9 10   CREATININE 0.90   < > 1.04 1.40* 1.23 1.21  CALCIUM 6.6*   < >  7.1* 7.8* 8.1* 8.0*  PROT 5.4*  --  4.5* 5.5*  --   --   BILITOT 1.5*  --  0.7 0.7  --   --   ALKPHOS 174*  --  197* 268*  --   --   ALT 41  --  36 39  --   --   AST 37  --  38 37  --   --   GLUCOSE 96   < > 126* 265* 133* 224*   < > = values in this interval not displayed.     Imaging/Diagnostic Tests: None new  Rosezetta Schlatter, MD 08/08/2021, 7:07 AM PGY-1, [place] Intern pager: [phone], text pages welcome

## 2021-08-09 LAB — CBC
HCT: 29.7 % — ABNORMAL LOW (ref 39.0–52.0)
Hemoglobin: 9.9 g/dL — ABNORMAL LOW (ref 13.0–17.0)
MCH: 31.3 pg (ref 26.0–34.0)
MCHC: 33.3 g/dL (ref 30.0–36.0)
MCV: 94 fL (ref 80.0–100.0)
Platelets: 146 10*3/uL — ABNORMAL LOW (ref 150–400)
RBC: 3.16 MIL/uL — ABNORMAL LOW (ref 4.22–5.81)
RDW: 19.3 % — ABNORMAL HIGH (ref 11.5–15.5)
WBC: 6 10*3/uL (ref 4.0–10.5)
nRBC: 0.3 % — ABNORMAL HIGH (ref 0.0–0.2)

## 2021-08-09 LAB — BASIC METABOLIC PANEL
Anion gap: 6 (ref 5–15)
BUN: 11 mg/dL (ref 6–20)
CO2: 27 mmol/L (ref 22–32)
Calcium: 8.2 mg/dL — ABNORMAL LOW (ref 8.9–10.3)
Chloride: 106 mmol/L (ref 98–111)
Creatinine, Ser: 1.17 mg/dL (ref 0.61–1.24)
GFR, Estimated: >60 mL/min (ref >60)
Glucose, Bld: 220 mg/dL — ABNORMAL HIGH (ref 70–99)
Potassium: 4.3 mmol/L (ref 3.5–5.1)
Sodium: 139 mmol/L (ref 135–145)

## 2021-08-09 LAB — GLUCOSE, CAPILLARY
Glucose-Capillary: 167 mg/dL — ABNORMAL HIGH (ref 70–99)
Glucose-Capillary: 248 mg/dL — ABNORMAL HIGH (ref 70–99)
Glucose-Capillary: 269 mg/dL — ABNORMAL HIGH (ref 70–99)
Glucose-Capillary: 201 mg/dL — ABNORMAL HIGH (ref 70–99)

## 2021-08-09 MED ORDER — INSULIN ASPART 100 UNIT/ML IJ SOLN
0.0000 [IU] | Freq: Every day | INTRAMUSCULAR | Status: DC
  Administered 2021-08-09: 2 [IU] via SUBCUTANEOUS

## 2021-08-09 MED ORDER — INSULIN ASPART 100 UNIT/ML IJ SOLN
0.0000 [IU] | Freq: Three times a day (TID) | INTRAMUSCULAR | Status: DC
  Administered 2021-08-09: 5 [IU] via SUBCUTANEOUS
  Administered 2021-08-10: 2 [IU] via SUBCUTANEOUS

## 2021-08-09 NOTE — Progress Notes (Signed)
Family Medicine Teaching Service Daily Progress Note Intern Pager: [phone]  Patient name: Jeffrey [name] Medical record number: [id number] Date of birth: [birth year] Age: 55 y.o. Gender: male  Primary Care Provider: [name], MD Consultants: Neurology (signed off) Code Status: Full  Pt Overview and Major Events to Date:  10/23: Admitted, C. difficile + 10/27: Developed AMS 10/31: AMS resolved 11/2: Fidaxomicin completed 11/3: Return of diarrhea  Assessment and Plan: Greene is a 55 year old male who presented with multiple falls and severe electrolyte derangements, found to be C. difficile positive on admission; hospital course C/B AMS.  PMH of alcohol use disorder, seizure disorder, PEA C/B an anoxic brain injury, T2DM, HTN, and HFpEF.  Non-fulminant, nonsevere C. difficile infection Patient reports diarrhea with 5 loose stools over the past 24 hours.  No leukocytosis - Will consult ID - Repeat CBC and CMP were ordered  WBC 6.0; BMP stable - Fidaxomicin complete - Enteric precautions to continue  Altered mental status, resolved Patient alert and oriented x4; baseline mental status.  Etiology of AMS likely combination of alcohol withdrawal, metabolic encephalopathy, and gabapentin withdrawal. - Neurology consulted, signed off - Continue gabapentin 300 mg 3 times daily - Continue thiamine and folic acid supplementation - PT/OT recommend SN short-term rehabilitation with a rolling walker; acute OT to continue to follow during admission; [facility] pending Ship broker.  T2DM 24-hour CBG 137-319.  Home regimen-20 units of Lantus daily.  Given total of 8 units SSI past 24 hours. -Continue Semglee 5 units -Continue Ensure Max, per diabetic coordinator rec (less carbohydrates)  -Continue SSI -Continue home Farxiga 10 mg daily  Seizure disorder, chronic/stable No seizure activity noted during hospitalization.  EEG without abnormalities on admission. -  Continue Keppra 1000 mg twice daily - Seizure precautions  Hospital issues resolved/no longer managed Elevated creatinine, resolved: No further BMP monitoring Electrolyte abnormalities, resolved: No further BMP monitoring Anemia, stable: No further CBC monitoring.  OP colonoscopy recommended. Urinary retention, asymptomatic   FEN/GI: Carb modified diet PPx: Lovenox Dispo:To skilled nursing short term rehab (<3 hours/day), possible discharge after completion of fidaxomicin, pending insurance auth.  Subjective:  Patient sleeping upon room entrance. Awoke to say that last night he had 4 loose stools, so he isn't feeling the best. He returned to sleep afterward.  Objective: Temp:  [97.6 F (36.4 C)] 97.6 F (36.4 C) (11/03 0443) Pulse Rate:  [74-91] 83 (11/03 1301) Resp:  [17-20] 20 (11/03 1301) BP: (95-142)/(63-88) 104/77 (11/03 1301) SpO2:  [96 %-100 %] 96 % (11/03 1301)  Physical Exam: General: Obese, age congruent male in no acute distress. Cardiovascular: Regular rate Respiratory: Normal pulmonary effort, normal breath sounds Abdomen: Soft, nontender, nondistended Neurological: He is asleep, waking briefly, then returning to sleep Psych: Normal mood and affect  Laboratory: Recent Labs  Lab 08/07/21 0118 08/08/21 0926 08/09/21 1112  WBC 6.4 6.6 6.0  HGB 9.2* 10.5* 9.9*  HCT 27.9* 31.4* 29.7*  PLT 143* 150 146*   Recent Labs  Lab 08/03/21 0108 08/04/21 0151 08/05/21 0131 08/06/21 0748 08/07/21 0118 08/08/21 0243 08/09/21 1112  NA 134*   < > 135 136 138 137 139  K 4.8   < > 4.5 5.0 4.5 4.8 4.3  CL 104   < > 107 104 107 106 106  CO2 25   < > 25 28 25 25 27   BUN 7   < > 7 8 9 10 11   CREATININE 0.90   < > 1.04 1.40* 1.23 1.21 1.17  CALCIUM  6.6*   < > 7.1* 7.8* 8.1* 8.0* 8.2*  PROT 5.4*  --  4.5* 5.5*  --   --   --   BILITOT 1.5*  --  0.7 0.7  --   --   --   ALKPHOS 174*  --  197* 268*  --   --   --   ALT 41  --  36 39  --   --   --   AST 37  --  38 37  --    --   --   GLUCOSE 96   < > 126* 265* 133* 224* 220*   < > = values in this interval not displayed.    Imaging/Diagnostic Tests: No new imaging  Rosezetta Schlatter, MD 08/09/2021, 1:43 PM PGY-1, [place] Intern pager: [phone], text pages welcome

## 2021-08-09 NOTE — TOC Progression Note (Signed)
Transition of Care [facility]) - Progression Note    Patient Details  Name: Jeffrey [name] MRN: [id number] Date of Birth: [birth year]  Transition of Care [facility]) CM/SW [place], LCSW Phone Number: 08/09/2021, 12:42 PM  Clinical Narrative:    [facility] still waiting on insurance approval.    Expected Discharge Plan: [place] Barriers to Discharge: Insurance Authorization  Expected Discharge Plan and Services Expected Discharge Plan: [place] In-house Referral: Clinical Social Work Discharge Planning Services: CM Consult   Living arrangements for the past 2 months: Single Family Home                                       Social Determinants of Health (SDOH) Interventions    Readmission Risk Interventions Readmission Risk Prevention Plan 08/01/2021  Transportation Screening Complete  PCP or Specialist Appt within 5-7 Days Complete  Home Care Screening Patient refused  Medication Review (RN CM) Complete  Some recent data might be hidden

## 2021-08-09 NOTE — Progress Notes (Signed)
Physical Therapy Treatment Patient Details Name: Jeffrey [name] MRN: [id number] DOB: 1965-12-05 Today's Date: 08/09/2021   History of Present Illness Pt is a 55 y.o. F who presents 07/29/2021 with a fall and multiple electrolyte abnormalities, found to be C. Dif. +, receiving Fidaxomicin. 10/26 developed tremor and confusion (?DTs vs withdrawal from neurontin)  Significant PMH: alcohol use disorder, erosive espophagitis, seizure disorder, PEA with cardiac arrest in 2014 requiring prolonged intubation.    PT Comments    Patient unfortunately experiencing incr diarrhea per his report. No feeling as well, yet continues to improve his activity tolerance and needing less physical assist for transfers and gait. Continues with posterior imbalance upon initial standing with up to min assist to recover and prevent quick descent back to sitting position.     Recommendations for follow up therapy are one component of a multi-disciplinary discharge planning process, led by the attending physician.  Recommendations may be updated based on patient status, additional functional criteria and insurance authorization.  Follow Up Recommendations  Skilled nursing-short term rehab (<3 hours/day)     Assistance Recommended at Discharge Frequent or constant Supervision/Assistance  Equipment Recommendations  Rolling walker (2 wheels);3in1 (PT)    Recommendations for Other Services       Precautions / Restrictions Precautions Precautions: Fall Precaution Comments: orthostatic hypotension     Mobility  Bed Mobility Overal bed mobility: Needs Assistance Bed Mobility: Sidelying to Sit;Rolling Rolling: Min guard Sidelying to sit: Min guard;HOB elevated (with rail)            Transfers Overall transfer level: Needs assistance Equipment used: Rolling walker (2 wheels) Transfers: Sit to/from Stand Sit to Stand: Min assist;From elevated surface           General transfer comment: vc for  sequencing with RW    Ambulation/Gait Ambulation/Gait assistance: Min assist Gait Distance (Feet): 10 Feet (toileted, 25) Assistive device: Rolling walker (2 wheels) Gait Pattern/deviations: Step-to pattern;Decreased step length - right;Decreased step length - left;Wide base of support;Trunk flexed Gait velocity: decr   General Gait Details: pt needing to use the bathroom on arrival; able to ambulate with 1 person assist ; education on turning and side-stepping with RW thru narrow spaces   Stairs             Wheelchair Mobility    Modified Rankin (Stroke Patients Only)       Balance Overall balance assessment: Needs assistance Sitting-balance support: Feet supported;Bilateral upper extremity supported Sitting balance-Leahy Scale: Fair Sitting balance - Comments: able to maintain balance sitting on eob   Standing balance support: Bilateral upper extremity supported;During functional activity;Reliant on assistive device for balance Standing balance-Leahy Scale: Poor                              Cognition Arousal/Alertness: Awake/alert Behavior During Therapy: Flat affect Overall Cognitive Status: Impaired/Different from baseline Area of Impairment: Attention;Following commands;Problem solving                   Current Attention Level: Selective   Following Commands: Follows one step commands with increased time   Awareness: Emergent Problem Solving: Slow processing;Requires verbal cues          Exercises General Exercises - Lower Extremity Long Arc Quad: Both;20 reps;Seated Other Exercises Other Exercises: sit to stand x 5 minguard to min assist (with occasional posterior imbalance)    General Comments        Pertinent  Vitals/Pain Pain Assessment: No/denies pain Faces Pain Scale: No hurt    Home Living                          Prior Function            PT Goals (current goals can now be found in the care plan  section) Acute Rehab PT Goals Patient Stated Goal: continue OPPT PT Goal Formulation: With patient Time For Goal Achievement: 08/14/21 Potential to Achieve Goals: Fair    Frequency    Min 3X/week (?progress to dc home)      PT Plan Current plan remains appropriate    Co-evaluation              AM-PAC PT "6 Clicks" Mobility   Outcome Measure  Help needed turning from your back to your side while in a flat bed without using bedrails?: A Little Help needed moving from lying on your back to sitting on the side of a flat bed without using bedrails?: A Little Help needed moving to and from a bed to a chair (including a wheelchair)?: A Little Help needed standing up from a chair using your arms (e.g., wheelchair or bedside chair)?: A Lot Help needed to walk in hospital room?: A Little Help needed climbing 3-5 steps with a railing? : Total 6 Click Score: 15    End of Session   Activity Tolerance: Patient tolerated treatment well Patient left: with call bell/phone within reach;in bed;with bed alarm set Nurse Communication: Mobility status PT Visit Diagnosis: Unsteadiness on feet (R26.81);Other abnormalities of gait and mobility (R26.89);Muscle weakness (generalized) (M62.81)     Time: 0321-2248 PT Time Calculation (min) (ACUTE ONLY): 47 min  Charges:  $Gait Training: 8-22 mins $Therapeutic Exercise: 8-22 mins                      Arby Barrette, PT Acute Rehabilitation Services  Pager [phone] Office [phone]    Rexanne Mano 08/09/2021, 12:10 PM

## 2021-08-09 NOTE — Progress Notes (Signed)
ID Pharmacy Note   I was asked by Family Medicine to review Jeffrey [name]'s chart due to the fact that he completed c difficile treatment last night with [facility] and was still having 4 bowel movements per day. Notably , he was having about 8 bowel movements per day when he was admitted. I discussed this patient with Dr. Baxter Flattery and we think his diarrhea is likely post-infectious and would recommend just monitoring and considering a bulking agent as needed.   Feel free to reach out with questions as needed!  Jimmy Footman, PharmD, BCPS, BCIDP Infectious Diseases Clinical Pharmacist Phone: [phone] 08/09/2021

## 2021-08-09 NOTE — Progress Notes (Signed)
Inpatient Diabetes Program Recommendations  AACE/ADA: New Consensus Statement on Inpatient Glycemic Control  Target Ranges:  Prepandial:   less than 140 mg/dL      Peak postprandial:   less than 180 mg/dL (1-2 hours)      Critically ill patients:  140 - 180 mg/dL   Results for Jeffrey [name], Jeffrey [name] (MRN [id number]) as of 08/09/2021 10:46  Ref. Range 08/08/2021 07:43 08/08/2021 11:43 08/08/2021 16:15 08/08/2021 21:17 08/09/2021 08:57  Glucose-Capillary Latest Ref Range: 70 - 99 mg/dL 169 (H) 319 (H) 286 (H) 137 (H) 167 (H)    Review of Glycemic Control  Diabetes history: DM2 Outpatient Diabetes medications: Lantus 20 units daily,Metformin XR 500 mg BID, Farxiga 10 mg daily Current orders for Inpatient glycemic control: Semglee 5 units daily, Novolog 0-6 units TID with meals, Farxiga 10 mg daily   Inpatient Diabetes Program Recommendations:     Insulin: Noted supplements changed to Ensure Max. If post prandial glucose remains consistently elevated, please consider ordering Novolog 3 units TID with meals for meal coverage if patient eats at least 50% of meals. Also please order Novolog 0-5 units QHS for bedtime correction.    Thanks, Barnie Alderman, RN, MSN, CDE Diabetes Coordinator Inpatient Diabetes Program [phone] (Team Pager from 8am to 5pm)

## 2021-08-10 LAB — GLUCOSE, CAPILLARY
Glucose-Capillary: 73 mg/dL (ref 70–99)
Glucose-Capillary: 205 mg/dL — ABNORMAL HIGH (ref 70–99)

## 2021-08-10 MED ORDER — INSULIN GLARGINE 100 UNITS/ML SOLOSTAR PEN
5.0000 [IU] | PEN_INJECTOR | Freq: Every day | SUBCUTANEOUS | 11 refills | Status: DC
Start: 2021-08-10 — End: 2021-10-02

## 2021-08-10 MED ORDER — GABAPENTIN 300 MG PO CAPS: 300.0000 mg | ORAL_CAPSULE | Freq: Three times a day (TID) | ORAL | 3 refills | Status: DC

## 2021-08-10 MED ORDER — ADULT MULTIVITAMIN W/MINERALS CH: 1.0000 | ORAL_TABLET | Freq: Every day | ORAL | Status: DC

## 2021-08-10 MED ORDER — ENSURE MAX PROTEIN PO LIQD: 11.0000 [oz_av] | Freq: Three times a day (TID) | ORAL | Status: DC

## 2021-08-10 MED ORDER — FAMOTIDINE 20 MG PO TABS: 20.0000 mg | ORAL_TABLET | Freq: Every day | ORAL | Status: DC

## 2021-08-10 NOTE — TOC Transition Note (Signed)
Transition of Care [facility]) - CM/SW Discharge Note   Patient Details  Name: Jeffrey [name] MRN: [id number] Date of Birth: 09-05-1966  Transition of Care [facility]) CM/SW Contact:  Benard Halsted, [place] Phone Number: 08/10/2021, 12:36 PM   Clinical Narrative:    Patient will DC to: [facility] Anticipated DC date: 08/10/21 Family notified: Pt notified family Transport by: Corey Harold   Per MD patient ready for DC to [facility]. RN to call report prior to discharge [phone]). RN, patient, patient's family, and facility notified of DC. Discharge Summary and FL2 sent to facility. DC packet on chart. Ambulance transport requested for patient.   CSW will sign off for now as social work intervention is no longer needed. Please consult [name] again if new needs arise.     Final next level of care: Skilled Nursing Facility Barriers to Discharge: Barriers Resolved   Patient Goals and CMS Choice Patient states their goals for this hospitalization and ongoing recovery are:: return home CMS Medicare.gov Compare Post Acute Care list provided to:: Patient Choice offered to / list presented to : Patient  Discharge Placement   Existing PASRR number confirmed : 08/10/21          Patient chooses bed at: [facility] Patient to be transferred to facility by: [place] Name of family member notified: Pt notified family Patient and family notified of of transfer: 08/10/21  Discharge Plan and Services In-house Referral: Clinical Social Work Discharge Planning Services: CM Consult                                 Social Determinants of Health ([place]) Interventions     Readmission Risk Interventions Readmission Risk Prevention Plan 08/01/2021  Transportation Screening Complete  PCP or Specialist Appt within 5-7 Days Complete  Home Care Screening Patient refused  Medication Review (RN CM) Complete  Some recent data might be hidden

## 2021-08-10 NOTE — Discharge Summary (Signed)
[facility] Discharge Summary  Patient name: Jeffrey [name] Medical record number: [id number] Date of birth: May 26, 1966 Age: 55 y.o. Gender: male Date of Admission: 07/29/2021  Date of Discharge: 08/10/2021 Admitting Physician: Lyndee Hensen, DO  Primary Care Provider: [name], MD Consultants: Neurology  Indication for Hospitalization: Syncope likely 2/2 profuse diarrhea and dehydration with severe electrolyte derangements.  Discharge Diagnoses/Problem List:  Type 2 Diabetes Mellitus Seizure disorder, chronic/stable Resolved during admission: Non-fulminant, non-severe C. Difficile infection Altered mental status Elevated Creatinine Electrolyte abnormalities Anemia Urinary retention  Disposition: SNF, [facility] negative  Discharge Condition: Stable  Discharge Exam:  General: Obese, age congruent male in no acute distress. HEENT: Poor dentition Cardiovascular: Regular rate and rhythm, normal heart sounds; no murmurs appreciated Respiratory: Normal pulmonary effort, CTA bilaterally with normal breath sounds Abdomen: Normoactive bowel sounds, soft, nontender, nondistended Dermatological: Xerosis to bilateral legs Neurological: He is alert and oriented x 4 Psych: Normal mood and affect  Brief Hospital Course:   Jeffrey [name] is a 55 y.o. male who presented with multiple falls at home and severe electrolyte derangements. Hospital course briefly complicated by altered mental status. PMH significant for alcohol use disorder, seizure disorder, hx of PEA arrest and anoxic brain injury, DM2, HTN and HFpEF.   Nonfulminant C Diff Infection  Severe Electrolyte Derangements Patient found to be C. Diff positive on admission. He was treated with a 10 day course of Fidaxomicin (10/24-11/2).  After completion of this regimen, patient endorsed 5 loose stools.  ID pharmacist consulted, who advised of the possibility of post-fidaxomicin  diarrhea.   Altered Mental Status Patient developed altered mental status and some increased somnolence on hospital day #4. Workup included negative CT head and normal EEG on admission, negative UDS, and electrolytes were within normal limits by this point in his admission. Neuro consulted Ultimately thought to be alcohol withdrawal with a component of gabapentin withdrawal as well.  Urinary Retention Patient had an initial bladder scan that showed 155 mL retained, and a subsequent with 248 mL retained.  Denied any urinary symptoms, so no further intervention.  T2DM CBG in mid 100s to low 300s throughout stay with Semglee adjusted to 5 units, and SSI given with meals and at bedtime correction.  Home Wilder Glade was continued and patient given Ensure supplement.  Follow up issues for PCP - 4 weeks after dc with neurology  - OP colonoscopy recommended for anemia   Significant Procedures:  ECHO showed LVEF 65-70% with mild LVH and trivial MVR and TVR.  EEG showed no epileptiform changes.    Significant Labs and Imaging:  Recent Labs  Lab 08/07/21 0118 08/08/21 0926 08/09/21 1112  WBC 6.4 6.6 6.0  HGB 9.2* 10.5* 9.9*  HCT 27.9* 31.4* 29.7*  PLT 143* 150 146*   Recent Labs  Lab 08/05/21 0131 08/06/21 0748 08/07/21 0118 08/08/21 0243 08/09/21 1112  NA 135 136 138 137 139  K 4.5 5.0 4.5 4.8 4.3  CL 107 104 107 106 106  CO2 25 28 25 25 27   GLUCOSE 126* 265* 133* 224* 220*  BUN 7 8 9 10 11   CREATININE 1.04 1.40* 1.23 1.21 1.17  CALCIUM 7.1* 7.8* 8.1* 8.0* 8.2*  MG 1.6* 2.3  --   --   --   ALKPHOS 197* 268*  --   --   --   AST 38 37  --   --   --   ALT 36 39  --   --   --  ALBUMIN <1.5* <1.5*  --  <1.5*  --      Results/Tests Pending at Time of Discharge: None  Discharge Medications:  Allergies as of 08/10/2021       Reactions   Lisinopril Other (See Comments)   Pt reports nose bleed.   Atorvastatin    Cause Muscle Weakness   Drug Ingredient [spironolactone]     Hyperkalemia        Medication List     STOP taking these medications    furosemide 40 MG tablet Commonly known as: LASIX   mirtazapine 30 MG tablet Commonly known as: REMERON   naltrexone 50 MG tablet Commonly known as: DEPADE   pantoprazole 40 MG tablet Commonly known as: PROTONIX   potassium chloride SA 20 MEQ tablet Commonly known as: KLOR-CON   sildenafil 100 MG tablet Commonly known as: VIAGRA       TAKE these medications    aspirin EC 81 MG tablet Take 1 tablet (81 mg total) by mouth at bedtime.   atorvastatin 80 MG tablet Commonly known as: LIPITOR Take 80 mg by mouth at bedtime.   dapagliflozin propanediol 10 MG Tabs tablet Commonly known as: FARXIGA Take 10 mg by mouth daily.   Dexcom G6 Sensor Misc 1 Units by Does not apply route as directed.   Dexcom G6 Transmitter Misc Use every 90 days   Ensure Max Protein Liqd Take 330 mLs (11 oz total) by mouth 3 (three) times daily between meals.   famotidine 20 MG tablet Commonly known as: PEPCID Take 1 tablet (20 mg total) by mouth daily.   folic acid 1 MG tablet Commonly known as: FOLVITE Take 1 tablet (1 mg total) by mouth daily. What changed: when to take this   gabapentin 300 MG capsule Commonly known as: NEURONTIN Take 1 capsule (300 mg total) by mouth 3 (three) times daily. What changed: how much to take   insulin glargine 100 unit/mL Sopn Commonly known as: LANTUS Inject 5 Units into the skin daily. What changed: how much to take   levETIRAcetam 1000 MG tablet Commonly known as: KEPPRA Take 1 tablet by mouth twice daily   metFORMIN 500 MG 24 hr tablet Commonly known as: GLUCOPHAGE-XR TAKE 1 TABLET BY MOUTH TWICE DAILY WITH A MEAL   multivitamin with minerals Tabs tablet Take 1 tablet by mouth daily. Start taking on: August 11, 2021   thiamine 100 MG tablet Commonly known as: Vitamin B-1 Take 1 tablet (100 mg total) by mouth daily.   True Metrix Blood Glucose Test test  strip Generic drug: glucose blood Please use to check blood sugar up to three times daily.               Durable Medical Equipment  (From admission, onward)           Start     Ordered   08/01/21 1008  For home use only DME 3 n 1  Once        08/01/21 1009            Discharge Instructions: Please refer to Patient Instructions section of EMR for full details.  Patient was counseled important signs and symptoms that should prompt return to medical care, changes in medications, dietary instructions, activity restrictions, and follow up appointments.   Follow-Up Appointments:  Contact information for follow-up providers     [name], MD Follow up in 1 week(s).   Specialty: Family Medicine Why: Hospital follow up Contact information: [address]  [place] [address] [phone]         [facility] Schedule an appointment as soon as possible for a visit.   Specialty: Rehabilitation Why: Physical therapy and Occupational therapy. Call for an apt Contact information: [address] [id number] [place] [phone]             Contact information for after-discharge care     [facility] .   Service: Skilled Nursing Contact information: [address] [place] [phone]                     Rosezetta Schlatter, MD 08/10/2021, 12:16 PM PGY-1, [place]

## 2021-08-10 NOTE — TOC Progression Note (Signed)
Transition of Care [facility]) - Progression Note    Patient Details  Name: Jeffrey [name] MRN: [id number] Date of Birth: 17-Nov-1965  Transition of Care [facility]) CM/SW [place], [place] Phone Number: 08/10/2021, 8:25 AM  Clinical Narrative:    [facility] has received insurance approval for patient to discharge there today. COVID test done 11/2.   Expected Discharge Plan: [place] Barriers to Discharge: Barriers Resolved  Expected Discharge Plan and Services Expected Discharge Plan: [place] In-house Referral: Clinical Social Work Discharge Planning Services: CM Consult   Living arrangements for the past 2 months: [place] Determinants of Health (SDOH) Interventions    Readmission Risk Interventions Readmission Risk Prevention Plan 08/01/2021  Transportation Screening Complete  PCP or Specialist Appt within 5-7 Days Complete  Home Care Screening Patient refused  Medication Review (RN CM) Complete  Some recent data might be hidden

## 2021-08-10 NOTE — Progress Notes (Signed)
FPTS Brief Note Reviewed patient's vitals, recent notes.  Vitals:   08/10/21 0113 08/10/21 0400  BP: 119/72 105/77  Pulse: 90 92  Resp: 20 17  Temp: (!) 97.5 F (36.4 C) 97.9 F (36.6 C)  SpO2: 97% 93%   At this time, no change in plan from day progress note.  Orvis Brill, DO Page [phone] with questions about this patient.

## 2021-09-11 ENCOUNTER — Other Ambulatory Visit: Payer: Self-pay | Admitting: Family Medicine

## 2021-09-11 DIAGNOSIS — R569 Unspecified convulsions: Secondary | ICD-10-CM

## 2021-09-13 ENCOUNTER — Emergency Department (HOSPITAL_COMMUNITY): Payer: 59

## 2021-09-13 ENCOUNTER — Other Ambulatory Visit: Payer: Self-pay

## 2021-09-13 ENCOUNTER — Inpatient Hospital Stay (HOSPITAL_COMMUNITY)
Admission: EM | Admit: 2021-09-13 | Discharge: 2021-09-17 | DRG: 871 | Disposition: A | Discharge status: Home Health | Payer: 59 | Attending: Family Medicine | Admitting: Family Medicine

## 2021-09-13 ENCOUNTER — Encounter (HOSPITAL_COMMUNITY): Payer: Self-pay | Admitting: Emergency Medicine

## 2021-09-13 ENCOUNTER — Inpatient Hospital Stay (HOSPITAL_COMMUNITY): Payer: 59

## 2021-09-13 DIAGNOSIS — F1010 Alcohol abuse, uncomplicated: Secondary | ICD-10-CM | POA: Diagnosis present

## 2021-09-13 DIAGNOSIS — I472 Ventricular tachycardia, unspecified: Secondary | ICD-10-CM | POA: Diagnosis not present

## 2021-09-13 DIAGNOSIS — K2100 Gastro-esophageal reflux disease with esophagitis, without bleeding: Secondary | ICD-10-CM | POA: Diagnosis present

## 2021-09-13 DIAGNOSIS — Z20822 Contact with and (suspected) exposure to covid-19: Secondary | ICD-10-CM | POA: Diagnosis present

## 2021-09-13 DIAGNOSIS — W010XXA Fall on same level from slipping, tripping and stumbling without subsequent striking against object, initial encounter: Secondary | ICD-10-CM | POA: Diagnosis present

## 2021-09-13 DIAGNOSIS — E860 Dehydration: Secondary | ICD-10-CM | POA: Diagnosis present

## 2021-09-13 DIAGNOSIS — N390 Urinary tract infection, site not specified: Secondary | ICD-10-CM | POA: Diagnosis present

## 2021-09-13 DIAGNOSIS — Z794 Long term (current) use of insulin: Secondary | ICD-10-CM

## 2021-09-13 DIAGNOSIS — T68XXXA Hypothermia, initial encounter: Secondary | ICD-10-CM | POA: Diagnosis present

## 2021-09-13 DIAGNOSIS — G4733 Obstructive sleep apnea (adult) (pediatric): Secondary | ICD-10-CM | POA: Diagnosis present

## 2021-09-13 DIAGNOSIS — R112 Nausea with vomiting, unspecified: Secondary | ICD-10-CM

## 2021-09-13 DIAGNOSIS — Y92009 Unspecified place in unspecified non-institutional (private) residence as the place of occurrence of the external cause: Secondary | ICD-10-CM

## 2021-09-13 DIAGNOSIS — D689 Coagulation defect, unspecified: Secondary | ICD-10-CM | POA: Diagnosis present

## 2021-09-13 DIAGNOSIS — B952 Enterococcus as the cause of diseases classified elsewhere: Secondary | ICD-10-CM | POA: Diagnosis present

## 2021-09-13 DIAGNOSIS — Z6835 Body mass index (BMI) 35.0-35.9, adult: Secondary | ICD-10-CM | POA: Diagnosis not present

## 2021-09-13 DIAGNOSIS — I10 Essential (primary) hypertension: Secondary | ICD-10-CM | POA: Diagnosis not present

## 2021-09-13 DIAGNOSIS — K7460 Unspecified cirrhosis of liver: Secondary | ICD-10-CM | POA: Diagnosis present

## 2021-09-13 DIAGNOSIS — Y900 Blood alcohol level of less than 20 mg/100 ml: Secondary | ICD-10-CM | POA: Diagnosis present

## 2021-09-13 DIAGNOSIS — E1140 Type 2 diabetes mellitus with diabetic neuropathy, unspecified: Secondary | ICD-10-CM

## 2021-09-13 DIAGNOSIS — F419 Anxiety disorder, unspecified: Secondary | ICD-10-CM | POA: Diagnosis present

## 2021-09-13 DIAGNOSIS — M6282 Rhabdomyolysis: Secondary | ICD-10-CM

## 2021-09-13 DIAGNOSIS — T383X1A Poisoning by insulin and oral hypoglycemic [antidiabetic] drugs, accidental (unintentional), initial encounter: Secondary | ICD-10-CM | POA: Diagnosis present

## 2021-09-13 DIAGNOSIS — I5032 Chronic diastolic (congestive) heart failure: Secondary | ICD-10-CM | POA: Diagnosis present

## 2021-09-13 DIAGNOSIS — E162 Hypoglycemia, unspecified: Secondary | ICD-10-CM | POA: Diagnosis not present

## 2021-09-13 DIAGNOSIS — Z8674 Personal history of sudden cardiac arrest: Secondary | ICD-10-CM

## 2021-09-13 DIAGNOSIS — E11649 Type 2 diabetes mellitus with hypoglycemia without coma: Secondary | ICD-10-CM | POA: Diagnosis present

## 2021-09-13 DIAGNOSIS — E669 Obesity, unspecified: Secondary | ICD-10-CM | POA: Diagnosis present

## 2021-09-13 DIAGNOSIS — I248 Other forms of acute ischemic heart disease: Secondary | ICD-10-CM | POA: Diagnosis present

## 2021-09-13 DIAGNOSIS — Z720 Tobacco use: Secondary | ICD-10-CM

## 2021-09-13 DIAGNOSIS — Z833 Family history of diabetes mellitus: Secondary | ICD-10-CM

## 2021-09-13 DIAGNOSIS — Z888 Allergy status to other drugs, medicaments and biological substances status: Secondary | ICD-10-CM

## 2021-09-13 DIAGNOSIS — G40909 Epilepsy, unspecified, not intractable, without status epilepticus: Secondary | ICD-10-CM | POA: Diagnosis present

## 2021-09-13 DIAGNOSIS — K7030 Alcoholic cirrhosis of liver without ascites: Secondary | ICD-10-CM | POA: Diagnosis not present

## 2021-09-13 DIAGNOSIS — Z7982 Long term (current) use of aspirin: Secondary | ICD-10-CM

## 2021-09-13 DIAGNOSIS — G931 Anoxic brain damage, not elsewhere classified: Secondary | ICD-10-CM | POA: Diagnosis present

## 2021-09-13 DIAGNOSIS — R569 Unspecified convulsions: Secondary | ICD-10-CM | POA: Diagnosis not present

## 2021-09-13 DIAGNOSIS — K2211 Ulcer of esophagus with bleeding: Secondary | ICD-10-CM | POA: Diagnosis present

## 2021-09-13 DIAGNOSIS — K922 Gastrointestinal hemorrhage, unspecified: Secondary | ICD-10-CM

## 2021-09-13 DIAGNOSIS — K2210 Ulcer of esophagus without bleeding: Secondary | ICD-10-CM | POA: Diagnosis present

## 2021-09-13 DIAGNOSIS — E1165 Type 2 diabetes mellitus with hyperglycemia: Secondary | ICD-10-CM | POA: Diagnosis not present

## 2021-09-13 DIAGNOSIS — K920 Hematemesis: Secondary | ICD-10-CM

## 2021-09-13 DIAGNOSIS — K5900 Constipation, unspecified: Secondary | ICD-10-CM | POA: Diagnosis present

## 2021-09-13 DIAGNOSIS — E876 Hypokalemia: Secondary | ICD-10-CM | POA: Diagnosis not present

## 2021-09-13 DIAGNOSIS — Z79899 Other long term (current) drug therapy: Secondary | ICD-10-CM

## 2021-09-13 DIAGNOSIS — R627 Adult failure to thrive: Secondary | ICD-10-CM | POA: Diagnosis present

## 2021-09-13 DIAGNOSIS — I110 Hypertensive heart disease with heart failure: Secondary | ICD-10-CM | POA: Diagnosis present

## 2021-09-13 DIAGNOSIS — I872 Venous insufficiency (chronic) (peripheral): Secondary | ICD-10-CM | POA: Diagnosis present

## 2021-09-13 DIAGNOSIS — A419 Sepsis, unspecified organism: Secondary | ICD-10-CM | POA: Diagnosis present

## 2021-09-13 DIAGNOSIS — D638 Anemia in other chronic diseases classified elsewhere: Secondary | ICD-10-CM | POA: Diagnosis present

## 2021-09-13 DIAGNOSIS — T68XXXD Hypothermia, subsequent encounter: Secondary | ICD-10-CM | POA: Diagnosis not present

## 2021-09-13 DIAGNOSIS — Z7984 Long term (current) use of oral hypoglycemic drugs: Secondary | ICD-10-CM

## 2021-09-13 DIAGNOSIS — E1169 Type 2 diabetes mellitus with other specified complication: Secondary | ICD-10-CM | POA: Diagnosis present

## 2021-09-13 DIAGNOSIS — E7800 Pure hypercholesterolemia, unspecified: Secondary | ICD-10-CM | POA: Diagnosis present

## 2021-09-13 DIAGNOSIS — Z7189 Other specified counseling: Secondary | ICD-10-CM | POA: Diagnosis not present

## 2021-09-13 DIAGNOSIS — R296 Repeated falls: Secondary | ICD-10-CM | POA: Diagnosis present

## 2021-09-13 LAB — URINALYSIS, ROUTINE W REFLEX MICROSCOPIC
Bilirubin Urine: NEGATIVE
Glucose, UA: NEGATIVE mg/dL
Leukocytes,Ua: NEGATIVE
Nitrite: NEGATIVE
Protein, ur: >300 mg/dL — AB
Specific Gravity, Urine: 1.025 (ref 1.005–1.030)
pH: 5.5 (ref 5.0–8.0)

## 2021-09-13 LAB — CORTISOL: Cortisol, Plasma: 30.4 ug/dL

## 2021-09-13 LAB — COMPREHENSIVE METABOLIC PANEL
ALT: 43 U/L (ref 0–44)
AST: 110 U/L — ABNORMAL HIGH (ref 15–41)
Albumin: 1.8 g/dL — ABNORMAL LOW (ref 3.5–5.0)
Alkaline Phosphatase: 141 U/L — ABNORMAL HIGH (ref 38–126)
Anion gap: 10 (ref 5–15)
BUN: 6 mg/dL (ref 6–20)
CO2: 25 mmol/L (ref 22–32)
Calcium: 7.7 mg/dL — ABNORMAL LOW (ref 8.9–10.3)
Chloride: 101 mmol/L (ref 98–111)
Creatinine, Ser: 0.75 mg/dL (ref 0.61–1.24)
GFR, Estimated: >60 mL/min (ref >60)
Glucose, Bld: 110 mg/dL — ABNORMAL HIGH (ref 70–99)
Potassium: 3.2 mmol/L — ABNORMAL LOW (ref 3.5–5.1)
Sodium: 136 mmol/L (ref 135–145)
Total Bilirubin: 0.3 mg/dL (ref 0.3–1.2)
Total Protein: 6.9 g/dL (ref 6.5–8.1)

## 2021-09-13 LAB — CBC WITH DIFFERENTIAL/PLATELET
Abs Immature Granulocytes: 0.14 10*3/uL — ABNORMAL HIGH (ref 0.00–0.07)
Basophils Absolute: 0 10*3/uL (ref 0.0–0.1)
Basophils Relative: 0 %
Eosinophils Absolute: 0 10*3/uL (ref 0.0–0.5)
Eosinophils Relative: 0 %
HCT: 36.2 % — ABNORMAL LOW (ref 39.0–52.0)
Hemoglobin: 11.9 g/dL — ABNORMAL LOW (ref 13.0–17.0)
Immature Granulocytes: 1 %
Lymphocytes Relative: 7 %
Lymphs Abs: 1.2 10*3/uL (ref 0.7–4.0)
MCH: 28.5 pg (ref 26.0–34.0)
MCHC: 32.9 g/dL (ref 30.0–36.0)
MCV: 86.6 fL (ref 80.0–100.0)
Monocytes Absolute: 1 10*3/uL (ref 0.1–1.0)
Monocytes Relative: 5 %
Neutro Abs: 16.7 10*3/uL — ABNORMAL HIGH (ref 1.7–7.7)
Neutrophils Relative %: 87 %
Platelets: 461 10*3/uL — ABNORMAL HIGH (ref 150–400)
RBC: 4.18 MIL/uL — ABNORMAL LOW (ref 4.22–5.81)
RDW: 15.5 % (ref 11.5–15.5)
WBC: 19.1 10*3/uL — ABNORMAL HIGH (ref 4.0–10.5)
nRBC: 0 % (ref 0.0–0.2)

## 2021-09-13 LAB — PROTIME-INR
INR: 1.4 — ABNORMAL HIGH (ref 0.8–1.2)
Prothrombin Time: 17.5 seconds — ABNORMAL HIGH (ref 11.4–15.2)

## 2021-09-13 LAB — LACTIC ACID, PLASMA
Lactic Acid, Venous: 2.1 mmol/L (ref 0.5–1.9)
Lactic Acid, Venous: 1.8 mmol/L (ref 0.5–1.9)

## 2021-09-13 LAB — CBC
HCT: 32.7 % — ABNORMAL LOW (ref 39.0–52.0)
Hemoglobin: 10.9 g/dL — ABNORMAL LOW (ref 13.0–17.0)
MCH: 28.7 pg (ref 26.0–34.0)
MCHC: 33.3 g/dL (ref 30.0–36.0)
MCV: 86.1 fL (ref 80.0–100.0)
Platelets: 358 10*3/uL (ref 150–400)
RBC: 3.8 MIL/uL — ABNORMAL LOW (ref 4.22–5.81)
RDW: 15.4 % (ref 11.5–15.5)
WBC: 17.2 10*3/uL — ABNORMAL HIGH (ref 4.0–10.5)
nRBC: 0 % (ref 0.0–0.2)

## 2021-09-13 LAB — CBG MONITORING, ED
Glucose-Capillary: 83 mg/dL (ref 70–99)
Glucose-Capillary: 104 mg/dL — ABNORMAL HIGH (ref 70–99)

## 2021-09-13 LAB — TYPE AND SCREEN
ABO/RH(D): O POS
Antibody Screen: NEGATIVE

## 2021-09-13 LAB — TSH: TSH: 3.73 u[IU]/mL (ref 0.350–4.500)

## 2021-09-13 LAB — BRAIN NATRIURETIC PEPTIDE: B Natriuretic Peptide: 1796.3 pg/mL — ABNORMAL HIGH (ref 0.0–100.0)

## 2021-09-13 LAB — MAGNESIUM: Magnesium: 0.9 mg/dL — CL (ref 1.7–2.4)

## 2021-09-13 LAB — RESP PANEL BY RT-PCR (FLU A&B, COVID) ARPGX2
Influenza A by PCR: NEGATIVE
Influenza B by PCR: NEGATIVE
SARS Coronavirus 2 by RT PCR: NEGATIVE

## 2021-09-13 LAB — APTT: aPTT: 39 seconds — ABNORMAL HIGH (ref 24–36)

## 2021-09-13 LAB — CK: Total CK: 2868 U/L — ABNORMAL HIGH (ref 49–397)

## 2021-09-13 LAB — ETHANOL: Alcohol, Ethyl (B): <10 mg/dL (ref <10)

## 2021-09-13 LAB — POC OCCULT BLOOD, ED: Fecal Occult Bld: NEGATIVE

## 2021-09-13 MED ORDER — PANTOPRAZOLE 80MG IVPB - SIMPLE MED
80.0000 mg | Freq: Once | INTRAVENOUS | Status: AC
  Administered 2021-09-13: 80 mg via INTRAVENOUS
  Filled 2021-09-13: qty 80

## 2021-09-13 MED ORDER — ONDANSETRON HCL 4 MG/2ML IJ SOLN: 4.0000 mg | Freq: Four times a day (QID) | INTRAMUSCULAR | Status: DC | PRN

## 2021-09-13 MED ORDER — VANCOMYCIN HCL 1250 MG/250ML IV SOLN
1250.0000 mg | Freq: Two times a day (BID) | INTRAVENOUS | Status: DC
  Administered 2021-09-14 – 2021-09-15 (×3): 1250 mg via INTRAVENOUS
  Filled 2021-09-13 (×3): qty 250

## 2021-09-13 MED ORDER — SODIUM CHLORIDE 0.9 % IV SOLN
2.0000 g | Freq: Three times a day (TID) | INTRAVENOUS | Status: DC
  Administered 2021-09-13 – 2021-09-16 (×8): 2 g via INTRAVENOUS
  Filled 2021-09-13 (×9): qty 2

## 2021-09-13 MED ORDER — LORAZEPAM 1 MG PO TABS: 1.0000 mg | ORAL_TABLET | ORAL | Status: AC | PRN

## 2021-09-13 MED ORDER — SODIUM CHLORIDE 0.9 % IV SOLN: Freq: Once | INTRAVENOUS | Status: AC

## 2021-09-13 MED ORDER — VANCOMYCIN HCL IN DEXTROSE 1-5 GM/200ML-% IV SOLN
1000.0000 mg | Freq: Once | INTRAVENOUS | Status: DC
  Filled 2021-09-13: qty 200

## 2021-09-13 MED ORDER — SODIUM CHLORIDE 0.9 % IV BOLUS: 1000.0000 mL | Freq: Once | INTRAVENOUS | Status: DC

## 2021-09-13 MED ORDER — POLYETHYLENE GLYCOL 3350 17 G PO PACK: 17.0000 g | PACK | Freq: Every day | ORAL | Status: DC | PRN

## 2021-09-13 MED ORDER — LEVETIRACETAM 500 MG PO TABS
1000.0000 mg | ORAL_TABLET | Freq: Two times a day (BID) | ORAL | Status: DC
  Administered 2021-09-13 – 2021-09-17 (×8): 1000 mg via ORAL
  Filled 2021-09-13 (×8): qty 2

## 2021-09-13 MED ORDER — SODIUM CHLORIDE 0.9 % IV SOLN
2.0000 g | Freq: Once | INTRAVENOUS | Status: AC
  Administered 2021-09-13: 2 g via INTRAVENOUS
  Filled 2021-09-13: qty 2

## 2021-09-13 MED ORDER — LORAZEPAM 2 MG/ML IJ SOLN: 1.0000 mg | INTRAMUSCULAR | Status: AC | PRN

## 2021-09-13 MED ORDER — ACETAMINOPHEN 325 MG PO TABS
650.0000 mg | ORAL_TABLET | Freq: Four times a day (QID) | ORAL | Status: DC | PRN
  Administered 2021-09-15 – 2021-09-16 (×2): 650 mg via ORAL
  Filled 2021-09-13 (×2): qty 2

## 2021-09-13 MED ORDER — ADULT MULTIVITAMIN W/MINERALS CH
1.0000 | ORAL_TABLET | Freq: Every day | ORAL | Status: DC
  Administered 2021-09-14 – 2021-09-17 (×4): 1 via ORAL
  Filled 2021-09-13 (×5): qty 1

## 2021-09-13 MED ORDER — VANCOMYCIN HCL 2000 MG/400ML IV SOLN
2000.0000 mg | Freq: Once | INTRAVENOUS | Status: AC
  Administered 2021-09-13: 2000 mg via INTRAVENOUS
  Filled 2021-09-13: qty 400

## 2021-09-13 MED ORDER — SODIUM CHLORIDE 0.9 % IV BOLUS
500.0000 mL | Freq: Once | INTRAVENOUS | Status: AC
  Administered 2021-09-13: 500 mL via INTRAVENOUS

## 2021-09-13 MED ORDER — CHLORHEXIDINE GLUCONATE CLOTH 2 % EX PADS
6.0000 | MEDICATED_PAD | Freq: Every day | CUTANEOUS | Status: DC
  Administered 2021-09-13 – 2021-09-17 (×4): 6 via TOPICAL

## 2021-09-13 MED ORDER — SODIUM CHLORIDE 0.9 % IV SOLN: INTRAVENOUS | Status: DC

## 2021-09-13 MED ORDER — THIAMINE HCL 100 MG/ML IJ SOLN: 100.0000 mg | Freq: Every day | INTRAMUSCULAR | Status: DC

## 2021-09-13 MED ORDER — ACETAMINOPHEN 650 MG RE SUPP: 650.0000 mg | Freq: Four times a day (QID) | RECTAL | Status: DC | PRN

## 2021-09-13 MED ORDER — FOLIC ACID 1 MG PO TABS
1.0000 mg | ORAL_TABLET | Freq: Every day | ORAL | Status: DC
  Administered 2021-09-14 – 2021-09-17 (×4): 1 mg via ORAL
  Filled 2021-09-13 (×6): qty 1

## 2021-09-13 MED ORDER — ONDANSETRON HCL 4 MG/2ML IJ SOLN
4.0000 mg | Freq: Once | INTRAMUSCULAR | Status: AC
  Administered 2021-09-13: 4 mg via INTRAVENOUS
  Filled 2021-09-13: qty 2

## 2021-09-13 MED ORDER — FUROSEMIDE 10 MG/ML IJ SOLN
40.0000 mg | Freq: Once | INTRAMUSCULAR | Status: AC
  Administered 2021-09-13: 40 mg via INTRAVENOUS
  Filled 2021-09-13: qty 4

## 2021-09-13 MED ORDER — ALBUTEROL SULFATE (2.5 MG/3ML) 0.083% IN NEBU: 2.5000 mg | INHALATION_SOLUTION | RESPIRATORY_TRACT | Status: DC | PRN

## 2021-09-13 MED ORDER — POTASSIUM CHLORIDE CRYS ER 20 MEQ PO TBCR
40.0000 meq | EXTENDED_RELEASE_TABLET | Freq: Once | ORAL | Status: AC
  Administered 2021-09-13: 40 meq via ORAL
  Filled 2021-09-13: qty 2

## 2021-09-13 MED ORDER — THIAMINE HCL 100 MG PO TABS
100.0000 mg | ORAL_TABLET | Freq: Every day | ORAL | Status: DC
  Administered 2021-09-14 – 2021-09-17 (×4): 100 mg via ORAL
  Filled 2021-09-13 (×5): qty 1

## 2021-09-13 MED ORDER — METRONIDAZOLE 500 MG/100ML IV SOLN
500.0000 mg | Freq: Once | INTRAVENOUS | Status: AC
  Administered 2021-09-13: 500 mg via INTRAVENOUS
  Filled 2021-09-13: qty 100

## 2021-09-13 MED ORDER — ONDANSETRON HCL 4 MG PO TABS: 4.0000 mg | ORAL_TABLET | Freq: Four times a day (QID) | ORAL | Status: DC | PRN

## 2021-09-13 MED ORDER — PANTOPRAZOLE INFUSION (NEW) - SIMPLE MED
8.0000 mg/h | INTRAVENOUS | Status: DC
  Administered 2021-09-13 – 2021-09-14 (×2): 8 mg/h via INTRAVENOUS
  Filled 2021-09-13 (×2): qty 80
  Filled 2021-09-13: qty 100

## 2021-09-13 MED ORDER — MAGNESIUM SULFATE 2 GM/50ML IV SOLN: 2.0000 g | Freq: Once | INTRAVENOUS | Status: DC

## 2021-09-13 MED ORDER — MAGNESIUM SULFATE 4 GM/100ML IV SOLN
4.0000 g | Freq: Once | INTRAVENOUS | Status: AC
  Administered 2021-09-14: 4 g via INTRAVENOUS
  Filled 2021-09-13: qty 100

## 2021-09-13 MED ORDER — SODIUM CHLORIDE 0.9% FLUSH
3.0000 mL | Freq: Two times a day (BID) | INTRAVENOUS | Status: DC
  Administered 2021-09-13 – 2021-09-17 (×6): 3 mL via INTRAVENOUS

## 2021-09-13 NOTE — ED Notes (Signed)
Pt placed on Bair hugger 

## 2021-09-13 NOTE — Progress Notes (Signed)
A consult was received from an ED physician for vancomycin and cefepime per pharmacy dosing.  The patient's profile has been reviewed for ht/wt/allergies/indication/available labs.   A one time order has been placed for cefepime 2 g per MD and vancomycin 2000 mg.  Further antibiotics/pharmacy consults should be ordered by admitting physician if indicated.                       Thank you, Napoleon Form 09/13/2021  2:58 PM

## 2021-09-13 NOTE — Progress Notes (Signed)
Pharmacy Antibiotic Note  Jeffrey [name] is a 55 y.o. male admitted on 09/13/2021 with hypothermia.  Pharmacy has been consulted for vancomycin and cefepime dosing.  Plan: Vancomycin 2000 mg iv once followed by 1250 mg IV Q 12 hrs. Goal AUC 400-550. Expected AUC: 485 SCr used: 0.8  Cefepime 2 g iv q 8 hours  Will f/u renal function, culture results, and clinical course Levels if/when indicated   Wt: 112.5 kg from 07/29/21  Temp (24hrs), Avg:93.7 F (34.3 C), Min:93 F (33.9 C), Max:94.2 F (34.6 C)  Recent Labs  Lab 09/13/21 1400 09/13/21 1733  WBC 19.1*  --   CREATININE 0.75  --   LATICACIDVEN 2.1* 1.8    CrCl cannot be calculated (Unknown ideal weight.).    Allergies  Allergen Reactions   Lisinopril Other (See Comments)    Pt reports nose bleed.   Atorvastatin     Cause Muscle Weakness   Drug Ingredient [Spironolactone]     Hyperkalemia    Antimicrobials this admission: 12/8 Flagyl x 1 12/8 cefepime >>  12/8 vancomycin >>   Dose adjustments this admission:   Microbiology results: 12/8 BCx:   Thank you for allowing pharmacy to be a part of this patient's care.  Napoleon Form 09/13/2021 7:41 PM

## 2021-09-13 NOTE — H&P (Addendum)
History and Physical    Jeffrey [name]:[id number] DOB: 1966/03/15 DOA: 09/13/2021  PCP: [name], MD   I have briefly reviewed patients previous medical reports in [facility]  Patient coming from: Home via EMS  Chief Complaint: Fall, hypoglycemia and hypothermia  HPI: Jeffrey [name] is a 55 year old male, reportedly lives with his adult children, ambulates with the help of a cane and a walker, recently discharged from SNF a few days ago, extensive past medical history including alcohol use disorder, seizure disorder, history of PEA arrest and anoxic brain injury, type II DM/IDDM, HTN, chronic diastolic CHF, GI bleeding, GERD, multiple hospitalizations and the most recent 1 between 07/29/2021 - 08/10/2021 for syncope secondary to C. difficile diarrhea, dehydration, severe electrolyte disturbances, presented to the [facility] ED 09/13/2021 following a fall 2 hours prior to ED arrival.  Per EMS, patient was found on the floor, initial CBG of 56, resuscitated with oral 15 g of glucose and ginger ale, subsequent CBG 70, in ED CBG of 83.  In ED noted to be hypothermic of 94 F and 93 F rectally and had an episode of coffee-ground emesis.  Patient is a poor historian.  Reports that he was recently discharged from SNF a few days ago.  This morning while going to the bathroom, reportedly slipped and fell.  Denies history of injuries but has left periorbital swelling and cannot tell me how this happened.  Denies LOC.  Ongoing poor appetite for several months.  Denies nausea, vomiting, abdominal pain or diarrhea except the episode of witnessed coffee-ground emesis in ED.  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  Denies dyspnea, chest pain, palpitations, cough, fever or chills.  No urinary symptoms.  Reports that his he has generalized body swelling and has gained some weight, indeterminate amount.  He did report  that this morning he felt that his blood sugar was low because he felt nervous and shaky and thereby called EMS.  ED Course: Temperature 94 F followed by 93 F, mildly tachypneic in the 20s, transiently tachycardic in the low 100s, normotensive, not hypoxic.  Lab work significant for potassium of 3.2, albumin of 1.8, AST of 110, CK of 2868, BNP of 1796.3, lactate of 2.1, WBC of 19.1, hemoglobin 11.9, INR 1.4.  Influenza A and B and coronavirus 19 RT-PCR negative.  Blood alcohol level <10.  FOBT negative.  Chest x-ray without active disease.  CT head and abdominal x-ray were ordered by me and pending.  Patient was bolused with 1 L of IV fluids, started on empiric IV cefepime, vancomycin and metronidazole for empiric sepsis coverage.  He also got a dose of IV Lasix 40 mg x 1.  Following coffee-ground emesis, started on Protonix bolus and IV infusion.  Review of Systems:  All other systems reviewed and apart from HPI, are negative.  Past Medical History:  Diagnosis Date   Acute renal insufficiency 12/08/2012   AKI (acute kidney injury) ([place])    ALCOHOL ABUSE, HX OF 11/10/2007   Anoxic encephalopathy ([place])    Anxiety    Bleeding ulcer 2014   Blood transfusion 2014   "related to bleeding ulcer"   Cardiac arrest ([place]) 12/07/2012   Anoxic encephalopathy   GERD (gastroesophageal reflux disease)    High cholesterol    Hypertension    OSA on CPAP 12/07/2012   Seizure-like activity ([place])    Seizures ([place])    last seizure 01/05/2021  Type II diabetes mellitus ([place])    Venous insufficiency 12/13/2011    Past Surgical History:  Procedure Laterality Date   BIOPSY  11/01/2020   Procedure: BIOPSY;  Surgeon: Irene Shipper, MD;  Location: [facility] ENDOSCOPY;  Service: Endoscopy;;   CARDIOVASCULAR STRESS TEST  03/16/13   Very Poor Exercise Tolerance; NON DIAGNOSTIC TEST   CATARACT EXTRACTION W/ INTRAOCULAR LENS IMPLANT Left 03/07/2014   [place] @ [facility]   ESOPHAGOGASTRODUODENOSCOPY Left 11/26/2012    Procedure: ESOPHAGOGASTRODUODENOSCOPY (EGD);  Surgeon: Wonda Horner, MD;  Location: [facility] ENDOSCOPY;  Service: Endoscopy;  Laterality: Left;   ESOPHAGOGASTRODUODENOSCOPY N/A 10/10/2014   Procedure: ESOPHAGOGASTRODUODENOSCOPY (EGD);  Surgeon: Ladene Artist, MD;  Location: [facility] ENDOSCOPY;  Service: Endoscopy;  Laterality: N/A;   ESOPHAGOGASTRODUODENOSCOPY (EGD) WITH PROPOFOL N/A 11/01/2020   Procedure: ESOPHAGOGASTRODUODENOSCOPY (EGD) WITH PROPOFOL;  Surgeon: Irene Shipper, MD;  Location: [facility] ENDOSCOPY;  Service: Endoscopy;  Laterality: N/A;   KNEE ARTHROSCOPY Left ~ [place] History  reports that he has never smoked. His smokeless tobacco use includes snuff. He reports current alcohol use. He reports that he does not use drugs.  Allergies  Allergen Reactions   Lisinopril Other (See Comments)    Pt reports nose bleed.   Atorvastatin     Cause Muscle Weakness   Drug Ingredient [Spironolactone]     Hyperkalemia    Family History  Problem Relation Age of Onset   Other Mother        Unsure of medical history.   Diabetes Mellitus II Father    Colon polyps Neg Hx    Colon cancer Neg Hx    Esophageal cancer Neg Hx    Rectal cancer Neg Hx    Stomach cancer Neg Hx      Prior to Admission medications   Medication Sig Start Date End Date Taking? Authorizing Provider  aspirin EC 81 MG tablet Take 1 tablet (81 mg total) by mouth at bedtime. 06/24/19   Zenia Resides, MD  atorvastatin (LIPITOR) 80 MG tablet Take 80 mg by mouth at bedtime. 05/18/21   [provider]  Continuous Blood Gluc Sensor (DEXCOM G6 SENSOR) MISC 1 Units by Does not apply route as directed. 09/26/20   [name], MD  Continuous Blood Gluc Transmit (DEXCOM G6 TRANSMITTER) MISC Use every 90 days 10/05/20   [name], MD  dapagliflozin propanediol (FARXIGA) 10 MG TABS tablet Take 10 mg by mouth daily.    [provider]  Ensure Max Protein (ENSURE MAX  PROTEIN) LIQD Take 330 mLs (11 oz total) by mouth 3 (three) times daily between meals. 08/10/21   Holley Bouche, MD  famotidine (PEPCID) 20 MG tablet Take 1 tablet (20 mg total) by mouth daily. 08/10/21   Holley Bouche, MD  folic acid (FOLVITE) 1 MG tablet Take 1 tablet (1 mg total) by mouth daily. Patient taking differently: Take 1 mg by mouth at bedtime. 07/04/20   [name], MD  gabapentin (NEURONTIN) 300 MG capsule Take 1 capsule (300 mg total) by mouth 3 (three) times daily. 08/10/21   Holley Bouche, MD  glucose blood (TRUE METRIX BLOOD GLUCOSE TEST) test strip Please use to check blood sugar up to three times daily.  07/03/20   [name], MD  insulin glargine (LANTUS) 100 unit/mL SOPN Inject 5 Units into the skin daily. 08/10/21   Holley Bouche, MD  levETIRAcetam (KEPPRA) 1000 MG tablet Take 1 tablet by  mouth twice daily 09/12/21   [name], MD  metFORMIN (GLUCOPHAGE-XR) 500 MG 24 hr tablet TAKE 1 TABLET BY MOUTH TWICE DAILY WITH A MEAL Patient taking differently: Take 500 mg by mouth 2 (two) times daily with a meal. 07/26/21   [name], MD  Multiple Vitamin (MULTIVITAMIN WITH MINERALS) TABS tablet Take 1 tablet by mouth daily. 08/11/21   Holley Bouche, MD  thiamine (VITAMIN B-1) 100 MG tablet Take 1 tablet (100 mg total) by mouth daily. 05/27/16   [name], DO    Physical Exam: Vitals:   09/13/21 1324 09/13/21 1418 09/13/21 1500 09/13/21 1545  BP: 126/86  (!) 149/106 (!) 143/99  Pulse: 99  100 (!) 102  Resp: 19  15 16   Temp:  (!) 94 F (34.4 C)  (!) 93 F (33.9 C)  TempSrc:  Rectal  Rectal  SpO2: 100%  100% 100%      Constitutional: Young male, looks older than stated age, moderately built and poorly nourished, unkempt, lying comfortably propped up in bed without distress. Eyes: Left periorbital swelling without ecchymosis.  Pupils equally reacting to light and accommodation.  Right mature cataract with blindness.  Left IOL.  No  conjunctival hemorrhage but has conjunctival edema. ENMT: Mucous membranes are moist.  Poor dentition.  Mouth, upper gown and meal tray with some coffee-ground material on it. Neck: supple, no masses, no thyromegaly.  Thick neck. Respiratory: Clear to auscultation without wheezing, rhonchi or crackles. No increased work of breathing. Cardiovascular: S1 & S2 heard, regular rate and rhythm. No JVD, murmurs, rubs or clicks.  Telemetry personally reviewed: Mild sinus tachycardia in the low 100s.  Anasarca.  Face also appears puffy. Abdomen: Obese/nondistended.  Soft.  Nontender.  No organomegaly or masses appreciated.  Normal bowel sounds heard. Musculoskeletal: Chronic edema of lower extremities, somewhat pitting, extending all the way up to lower anterior abdominal wall.  Able to move all limbs symmetrically.  Also has bilateral upper extremity pitting edema. Skin: Hyperpigmentation and scaling of skin of legs.  Small superficial abrasion on his toes, likely related to his recent fall. Neurologic: CN 2-12 grossly intact. Sensation intact, DTR normal.  Strength at least 4 x 5 in upper extremities and 3 x 5 in lower extremities. Psychiatric: Impaired judgment and insight. Alert and oriented x 3. Normal mood.     Labs on Admission: I have personally reviewed following labs and imaging studies  CBC: Recent Labs  Lab 09/13/21 1400  WBC 19.1*  NEUTROABS 16.7*  HGB 11.9*  HCT 36.2*  MCV 86.6  PLT 461*    Basic Metabolic Panel: Recent Labs  Lab 09/13/21 1400  NA 136  K 3.2*  CL 101  CO2 25  GLUCOSE 110*  BUN 6  CREATININE 0.75  CALCIUM 7.7*    Liver Function Tests: Recent Labs  Lab 09/13/21 1400  AST 110*  ALT 43  ALKPHOS 141*  BILITOT 0.3  PROT 6.9  ALBUMIN 1.8*     Radiological Exams on Admission: DG Chest Port 1 View  Result Date: 09/13/2021 CLINICAL DATA:  Possible sepsis. EXAM: PORTABLE CHEST 1 VIEW COMPARISON:  Chest x-ray dated July 29, 2021. FINDINGS: The  heart size and mediastinal contours are within normal limits. Both lungs are clear. The visualized skeletal structures are unremarkable. IMPRESSION: No active disease. Electronically Signed   By: Titus Dubin M.D.   On: 09/13/2021 15:48    EKG: Independently reviewed.  Sinus rhythm at 98 bpm, normal axis, nonspecific ST-T abnormalities and  QTC of 520 ms.  Assessment/Plan Principal Problem:   Hypothermia Active Problems:   HYPERCHOLESTEROLEMIA   Hypoglycemia   Anoxic brain injury (HCC)   Cirrhosis ([place])   Essential hypertension   Hypokalemia   Hyperlipidemia associated with type 2 diabetes mellitus ([place])   GI bleed   Recurrent falls     Hypothermia: Differential diagnosis: Related to hypoglycemia, low index of suspicion for sepsis but cannot be ruled out versus other etiologies.  Treat with Bair hugger, warm IV fluids, monitor temperature closely and frequently.  Once euthermic, can remove Retail banker.  Check TSH and random cortisol.  Treat empirically with IV antibiotics for sepsis.  Follow-up on blood culture, urine microscopy and urine culture results  Type II DM/IDDM with hypoglycemia: Likely related to poor oral intake and insulins.  Resuscitated with oral intake by EMS and CBGs have normalized.  Monitor CBGs closely and initiate SSI only if CBGs consistently >180.  Coffee-ground emesis: Appears that has had prior history of GI bleed, multiple procedures.  Clear liquids for now and n.p.o. after midnight.  Continue PPI bolus and IV PPI infusion.  Monitor H&H closely. DD: Esophagitis, gastritis, ulcers, esophageal varices versus others. INR 1.4.  I discussed with and have consulted Dr. Havery Moros, [facility].  Reviewed most recent EGD report in January 2022 which showed gastritis, erosive esophagitis and duodenal ulcer but no esophageal varices or portal hypertensive gastropathy and hence no octreotide started.  Hypokalemia: Replaced.  Follow BMP in AM.  Follow and replace magnesium as  needed.  Leukocytosis: Unclear etiology.  Could be stress response versus sepsis.  Follow daily CBCs.  Anemia: Hemoglobin actually appears to be better than his baseline.  This may be related to hemoconcentration.  Follow CBC daily.  History of seizures: No reported history of seizures.  Continue Keppra 1 g twice daily.  Seizure precautions.  Anasarca: Likely multifactorial due to poor nutritional status, cirrhosis, chronic diastolic CHF.  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 Mild coagulopathy. Anasarca. Hold IVF S/P IV Lasix. Consider starting Lasix & Aldactone from tomorrow.  Falls: PT & OT eval  HLD: Holding pta.  HTN: Hold meds d/t GI bleed to avoid hypotension.  Elevated CK: may be related to fall and Cirrhosis. Cannot give IVF d/t anasarca   DVT prophylaxis: SCD's  Code Status: Full. Confirmed with patient   Family Communication: None at bedside  Disposition Plan:   Patient is from:  Home  Anticipated DC to:  Home  Anticipated DC date:  3-4 days  Anticipated DC barriers: None   Consults called: EDP will call [facility]  Admission status: IP, Stepdown   Severity of Illness: The appropriate patient status for this patient is INPATIENT. Inpatient status is judged to be reasonable and necessary in order to provide the required intensity of service to ensure the patient's safety. The patient's presenting symptoms, physical exam findings, and initial radiographic and laboratory data in the context of their chronic comorbidities is felt to place them at high risk for further clinical deterioration. Furthermore, it is not anticipated that the patient will be medically stable for discharge from the hospital within 2 midnights of admission.   * I certify that at the point of admission it is my clinical judgment that the patient will require inpatient hospital care spanning beyond 2 midnights from the point of admission due to high intensity of service, high risk for further deterioration and  high frequency of surveillance required.Vernell Leep MD [facility]  To contact the  attending provider between 7A-7P or the covering provider during after hours 7P-7A, please log into the web site [email or web address] and access using universal [place] password for that web site. If you do not have the password, please call the hospital operator.  09/13/2021, 5:26 PM

## 2021-09-13 NOTE — ED Notes (Signed)
Unable to obtain second blood culture at this time. 

## 2021-09-13 NOTE — ED Provider Notes (Signed)
[facility] DEPT Provider Note   CSN: [id number] Arrival date & time: 09/13/21  1306     History No chief complaint on file.   Jeffrey [name] is a 55 y.o. male.  HPI  55 year old male with a history of acute renal insufficiency, alcohol abuse, anoxic encephalopathy, anxiety, bleeding ulcer, cardiac arrest, GERD, hyperlipidemia, hypertension, OSA, seizure-like activity, diabetes, who presents to the emergency department today for evaluation of hypoglycemia and fall.  He states that he started feeling like his blood sugar was low so he started walking to the kitchen to check it but then fell onto the floor.  He states he did not hit his head and he denies any current injuries.  He states that his blood sugar was low because he has not been eating for the last few days because he does not have an appetite.  This is a chronic issue for him.  He denies any abdominal pain or vomiting and denies any diarrhea or constipation.  Past Medical History:  Diagnosis Date   Acute renal insufficiency 12/08/2012   AKI (acute kidney injury) ([place])    ALCOHOL ABUSE, HX OF 11/10/2007   Anoxic encephalopathy ([place])    Anxiety    Bleeding ulcer 2014   Blood transfusion 2014   "related to bleeding ulcer"   Cardiac arrest ([place]) 12/07/2012   Anoxic encephalopathy   GERD (gastroesophageal reflux disease)    High cholesterol    Hypertension    OSA on CPAP 12/07/2012   Seizure-like activity ([place])    Seizures ([place])    last seizure 01/05/2021   Type II diabetes mellitus ([place])    Venous insufficiency 12/13/2011    Patient Active Problem List   Diagnosis Date Noted   Hypothermia 09/13/2021   Hypoalbuminemia    Recurrent falls 07/29/2021   Grief counseling 01/04/2021   Incontinence 12/23/2020   Hospital discharge follow-up 11/24/2020   Syncope 11/17/2020   Hypomagnesemia 11/05/2020   Hematemesis with nausea    Alcohol abuse with intoxication ([place])    Duodenal ulcer hemorrhagic     GI bleed 10/31/2020   Hyperglycemia 10/31/2020   DKA (diabetic ketoacidosis) ([place]) 07/22/2020   Candidiasis 07/22/2020   Hyponatremia 06/28/2020   Eyebrow laceration, left, initial encounter    Hypokalemia    Hyperlipidemia associated with type 2 diabetes mellitus (HCC)    MGUS (monoclonal gammopathy of unknown significance) 05/31/2020   Seizure disorder ([place]) 03/16/2020   Type 2 diabetes mellitus with hyperglycemia, with long-term current use of insulin ([place]) 03/16/2020   Tremor 03/16/2020   Colon cancer screening 02/22/2020   Stage 3a chronic kidney disease ([place]) 01/29/2020   Hyperphosphatemia 01/29/2020   Major depressive disorder, recurrent episode ([place]) 01/13/2018   Weakness    Hypotension 07/27/2017   Seizure ([place]) 04/08/2017   Proteinuria 11/23/2015   Essential hypertension    Cirrhosis ([place]) 03/22/2015   Ataxia 03/20/2015   Elevated liver enzymes    Dysarthria 03/09/2015   AKI (acute kidney injury) ([place])    Acute blood loss anemia 11/17/2014   ETOH abuse    Tachycardia    Melena 10/10/2014   Erosive esophagitis 10/10/2014   Somnolence 10/08/2014   Acute kidney injury ([place])    OSA on CPAP 12/28/2012   Hypoglycemia 12/07/2012   Cardiac arrest ([place]) 12/07/2012   Anoxic brain injury ([place]) 12/07/2012   ERECTILE DYSFUNCTION 04/14/2007   Type 2 diabetes mellitus with diabetic neuropathy, with long-term current use of insulin ([place]) 12/04/2006   HYPERCHOLESTEROLEMIA 12/04/2006  Past Surgical History:  Procedure Laterality Date   BIOPSY  11/01/2020   Procedure: BIOPSY;  Surgeon: Irene Shipper, MD;  Location: [facility] ENDOSCOPY;  Service: Endoscopy;;   CARDIOVASCULAR STRESS TEST  03/16/13   Very Poor Exercise Tolerance; NON DIAGNOSTIC TEST   CATARACT EXTRACTION W/ INTRAOCULAR LENS IMPLANT Left 03/07/2014   [place] @ [facility]   ESOPHAGOGASTRODUODENOSCOPY Left 11/26/2012   Procedure: ESOPHAGOGASTRODUODENOSCOPY (EGD);  Surgeon: Wonda Horner, MD;  Location: [facility]  ENDOSCOPY;  Service: Endoscopy;  Laterality: Left;   ESOPHAGOGASTRODUODENOSCOPY N/A 10/10/2014   Procedure: ESOPHAGOGASTRODUODENOSCOPY (EGD);  Surgeon: Ladene Artist, MD;  Location: [facility] ENDOSCOPY;  Service: Endoscopy;  Laterality: N/A;   ESOPHAGOGASTRODUODENOSCOPY (EGD) WITH PROPOFOL N/A 11/01/2020   Procedure: ESOPHAGOGASTRODUODENOSCOPY (EGD) WITH PROPOFOL;  Surgeon: Irene Shipper, MD;  Location: [facility] ENDOSCOPY;  Service: Endoscopy;  Laterality: N/A;   KNEE ARTHROSCOPY Left ~ 1999   UPPER GASTROINTESTINAL ENDOSCOPY         Family History  Problem Relation Age of Onset   Other Mother        Unsure of medical history.   Diabetes Mellitus II Father    Colon polyps Neg Hx    Colon cancer Neg Hx    Esophageal cancer Neg Hx    Rectal cancer Neg Hx    Stomach cancer Neg Hx     Social History   Tobacco Use   Smoking status: Never   Smokeless tobacco: Current    Types: Snuff  Vaping Use   Vaping Use: Never used  Substance Use Topics   Alcohol use: Yes    Comment: 12 pack beer / week   Drug use: No    Home Medications Prior to Admission medications   Medication Sig Start Date End Date Taking? Authorizing Provider  aspirin EC 81 MG tablet Take 1 tablet (81 mg total) by mouth at bedtime. 06/24/19   Zenia Resides, MD  atorvastatin (LIPITOR) 80 MG tablet Take 80 mg by mouth at bedtime. 05/18/21   [provider]  Continuous Blood Gluc Sensor (DEXCOM G6 SENSOR) MISC 1 Units by Does not apply route as directed. 09/26/20   [name], MD  Continuous Blood Gluc Transmit (DEXCOM G6 TRANSMITTER) MISC Use every 90 days 10/05/20   [name], MD  dapagliflozin propanediol (FARXIGA) 10 MG TABS tablet Take 10 mg by mouth daily.    [provider]  Ensure Max Protein (ENSURE MAX PROTEIN) LIQD Take 330 mLs (11 oz total) by mouth 3 (three) times daily between meals. 08/10/21   Holley Bouche, MD  famotidine (PEPCID) 20 MG tablet Take 1 tablet (20 mg total) by mouth  daily. 08/10/21   Holley Bouche, MD  folic acid (FOLVITE) 1 MG tablet Take 1 tablet (1 mg total) by mouth daily. Patient taking differently: Take 1 mg by mouth at bedtime. 07/04/20   [name], MD  gabapentin (NEURONTIN) 300 MG capsule Take 1 capsule (300 mg total) by mouth 3 (three) times daily. 08/10/21   Holley Bouche, MD  glucose blood (TRUE METRIX BLOOD GLUCOSE TEST) test strip Please use to check blood sugar up to three times daily.  07/03/20   [name], MD  insulin glargine (LANTUS) 100 unit/mL SOPN Inject 5 Units into the skin daily. 08/10/21   Holley Bouche, MD  levETIRAcetam (KEPPRA) 1000 MG tablet Take 1 tablet by mouth twice daily 09/12/21   [name], MD  metFORMIN (GLUCOPHAGE-XR) 500 MG 24 hr tablet TAKE 1 TABLET BY MOUTH TWICE  DAILY WITH A MEAL Patient taking differently: Take 500 mg by mouth 2 (two) times daily with a meal. 07/26/21   [name], MD  Multiple Vitamin (MULTIVITAMIN WITH MINERALS) TABS tablet Take 1 tablet by mouth daily. 08/11/21   Holley Bouche, MD  thiamine (VITAMIN B-1) 100 MG tablet Take 1 tablet (100 mg total) by mouth daily. 05/27/16   [name], DO    Allergies    Lisinopril, Atorvastatin, and Drug ingredient [spironolactone]  Review of Systems   Review of Systems  Constitutional:  Negative for fever.  HENT:  Negative for ear pain and sore throat.   Eyes:  Negative for visual disturbance.  Respiratory:  Negative for cough and shortness of breath.   Cardiovascular:  Negative for chest pain.  Gastrointestinal:  Negative for abdominal pain, constipation, diarrhea, nausea and vomiting.  Genitourinary:  Negative for dysuria and hematuria.  Musculoskeletal:  Negative for back pain and neck pain.  Skin:  Negative for color change.  Neurological:  Negative for weakness and numbness.       No head injury or loc  All other systems reviewed and are negative.  Physical Exam Updated Vital Signs BP (!) 142/105   Pulse  (!) 101   Temp (!) 96.2 F (35.7 C) (Axillary)   Resp 14   Ht 5\' 10"  (1.778 m)   Wt 110.1 kg   SpO2 100%   BMI 34.83 kg/m   Physical Exam Vitals and nursing note reviewed.  Constitutional:      General: He is not in acute distress.    Appearance: He is well-developed.     Comments: unkempt  HENT:     Head: Normocephalic and atraumatic.  Eyes:     Conjunctiva/sclera: Conjunctivae normal.  Cardiovascular:     Rate and Rhythm: Normal rate and regular rhythm.     Heart sounds: No murmur heard. Pulmonary:     Effort: Pulmonary effort is normal. No respiratory distress.     Breath sounds: Normal breath sounds.  Abdominal:     General: Bowel sounds are normal.     Palpations: Abdomen is soft.     Tenderness: There is no abdominal tenderness.     Comments: Chronic discoloration to the skin of the abdomen  Musculoskeletal:        General: No swelling.     Cervical back: Neck supple.     Comments: Edema to the bue, ble and face. No midline cspine ttp  Skin:    General: Skin is warm and dry.     Capillary Refill: Capillary refill takes less than 2 seconds.  Neurological:     Mental Status: He is alert.     Comments: Clear speech, oriented, moving all extremities  Psychiatric:        Mood and Affect: Mood normal.    ED Results / Procedures / Treatments   Labs (all labs ordered are listed, but only abnormal results are displayed) Labs Reviewed  LACTIC ACID, PLASMA - Abnormal; Notable for the following components:      Result Value   Lactic Acid, Venous 2.1 (*)    All other components within normal limits  COMPREHENSIVE METABOLIC PANEL - Abnormal; Notable for the following components:   Potassium 3.2 (*)    Glucose, Bld 110 (*)    Calcium 7.7 (*)    Albumin 1.8 (*)    AST 110 (*)    Alkaline Phosphatase 141 (*)    All other components within normal limits  CBC WITH DIFFERENTIAL/PLATELET - Abnormal; Notable for the following components:   WBC 19.1 (*)    RBC 4.18 (*)     Hemoglobin 11.9 (*)    HCT 36.2 (*)    Platelets 461 (*)    Neutro Abs 16.7 (*)    Abs Immature Granulocytes 0.14 (*)    All other components within normal limits  PROTIME-INR - Abnormal; Notable for the following components:   Prothrombin Time 17.5 (*)    INR 1.4 (*)    All other components within normal limits  APTT - Abnormal; Notable for the following components:   aPTT 39 (*)    All other components within normal limits  URINALYSIS, ROUTINE W REFLEX MICROSCOPIC - Abnormal; Notable for the following components:   APPearance HAZY (*)    Hgb urine dipstick LARGE (*)    Ketones, ur TRACE (*)    Protein, ur >300 (*)    Bacteria, UA FEW (*)    All other components within normal limits  BRAIN NATRIURETIC PEPTIDE - Abnormal; Notable for the following components:   B Natriuretic Peptide 1,796.3 (*)    All other components within normal limits  CK - Abnormal; Notable for the following components:   Total CK 2,868 (*)    All other components within normal limits  CBC - Abnormal; Notable for the following components:   WBC 17.2 (*)    RBC 3.80 (*)    Hemoglobin 10.9 (*)    HCT 32.7 (*)    All other components within normal limits  MAGNESIUM - Abnormal; Notable for the following components:   Magnesium 0.9 (*)    All other components within normal limits  CBG MONITORING, ED - Abnormal; Notable for the following components:   Glucose-Capillary 104 (*)    All other components within normal limits  RESP PANEL BY RT-PCR (FLU A&B, COVID) ARPGX2  CULTURE, BLOOD (ROUTINE X 2)  CULTURE, BLOOD (ROUTINE X 2)  MRSA NEXT GEN BY PCR, NASAL  LACTIC ACID, PLASMA  ETHANOL  TSH  CORTISOL  COMPREHENSIVE METABOLIC PANEL  CBC  CBG MONITORING, ED  POC OCCULT BLOOD, ED  CBG MONITORING, ED  TYPE AND SCREEN  TROPONIN I (HIGH SENSITIVITY)    EKG EKG Interpretation  Date/Time:  Thursday September 13 2021 15:24:32 EST Ventricular Rate:  102 PR Interval:  54 QRS Duration: 96 QT  Interval:  432 QTC Calculation: 555 R Axis:   81 Text Interpretation: Sinus tachycardia Multiform ventricular premature complexes Probable left atrial enlargement Abnormal T, consider ischemia, lateral leads ST elevation, consider lateral injury Prolonged QT interval Artifact in lead(s) I II III aVR aVL aVF No significant change since last tracing Confirmed by Fredia Sorrow [phone]) on 09/13/2021 3:34:48 PM  Radiology CT HEAD WO CONTRAST (5MM)  Result Date: 09/13/2021 CLINICAL DATA:  Syncope/presyncope, cerebrovascular cause suspected EXAM: CT HEAD WITHOUT CONTRAST TECHNIQUE: Contiguous axial images were obtained from the base of the skull through the vertex without intravenous contrast. COMPARISON:  None. BRAIN: BRAIN Cerebral ventricle sizes are concordant with the degree of cerebral volume loss. No evidence of large-territorial acute infarction. No parenchymal hemorrhage. No mass lesion. No extra-axial collection. No mass effect or midline shift. No hydrocephalus. Basilar cisterns are patent. Vascular: No hyperdense vessel. Skull: No acute fracture or focal lesion. Sinuses/Orbits: Paranasal sinuses and mastoid air cells are clear. Left lens replacement. Otherwise orbits are unremarkable. Other: None. IMPRESSION: No acute intracranial abnormality. Electronically Signed   By: Iven Finn M.D.   On: 09/13/2021  17:54   DG Chest Port 1 View  Result Date: 09/13/2021 CLINICAL DATA:  Possible sepsis. EXAM: PORTABLE CHEST 1 VIEW COMPARISON:  Chest x-ray dated July 29, 2021. FINDINGS: The heart size and mediastinal contours are within normal limits. Both lungs are clear. The visualized skeletal structures are unremarkable. IMPRESSION: No active disease. Electronically Signed   By: Titus Dubin M.D.   On: 09/13/2021 15:48   DG Abd Portable 1V  Result Date: 09/13/2021 CLINICAL DATA:  Nausea and vomiting EXAM: PORTABLE ABDOMEN - 1 VIEW COMPARISON:  CT abdomen pelvis 11/01/2020 FINDINGS: Prominent  loop of small bowel within the right mid abdomen with no definite dilatation. The bowel gas pattern is normal. No radio-opaque calculi or other significant radiographic abnormality are seen. IMPRESSION: Negative. Electronically Signed   By: Iven Finn M.D.   On: 09/13/2021 17:39    Procedures Procedures   CRITICAL CARE Performed by: Rodney Booze   Total critical care time: 48 minutes  Critical care time was exclusive of separately billable procedures and treating other patients.  Critical care was necessary to treat or prevent imminent or life-threatening deterioration.  Critical care was time spent personally by me on the following activities: development of treatment plan with patient and/or surrogate as well as nursing, discussions with consultants, evaluation of patient's response to treatment, examination of patient, obtaining history from patient or surrogate, ordering and performing treatments and interventions, ordering and review of laboratory studies, ordering and review of radiographic studies, pulse oximetry and re-evaluation of patient's condition.   Medications Ordered in ED Medications  pantoprozole (PROTONIX) 80 mg /NS 100 mL infusion (8 mg/hr Intravenous New Bag/Given 09/13/21 1838)  sodium chloride flush (NS) 0.9 % injection 3 mL (has no administration in time range)  acetaminophen (TYLENOL) tablet 650 mg (has no administration in time range)    Or  acetaminophen (TYLENOL) suppository 650 mg (has no administration in time range)  polyethylene glycol (MIRALAX / GLYCOLAX) packet 17 g (has no administration in time range)  ondansetron (ZOFRAN) tablet 4 mg (has no administration in time range)    Or  ondansetron (ZOFRAN) injection 4 mg (has no administration in time range)  albuterol (PROVENTIL) (2.5 MG/3ML) 0.083% nebulizer solution 2.5 mg (has no administration in time range)  levETIRAcetam (KEPPRA) tablet 1,000 mg (has no administration in time range)   LORazepam (ATIVAN) tablet 1-4 mg (has no administration in time range)    Or  LORazepam (ATIVAN) injection 1-4 mg (has no administration in time range)  thiamine tablet 100 mg (has no administration in time range)    Or  thiamine (B-1) injection 100 mg (has no administration in time range)  folic acid (FOLVITE) tablet 1 mg (has no administration in time range)  multivitamin with minerals tablet 1 tablet (has no administration in time range)  vancomycin (VANCOREADY) IVPB 1250 mg/250 mL (has no administration in time range)  ceFEPIme (MAXIPIME) 2 g in sodium chloride 0.9 % 100 mL IVPB (has no administration in time range)  Chlorhexidine Gluconate Cloth 2 % PADS 6 each (has no administration in time range)  magnesium sulfate IVPB 4 g 100 mL (has no administration in time range)  ceFEPIme (MAXIPIME) 2 g in sodium chloride 0.9 % 100 mL IVPB (0 g Intravenous Stopped 09/13/21 1533)  metroNIDAZOLE (FLAGYL) IVPB 500 mg (0 mg Intravenous Stopped 09/13/21 1659)  vancomycin (VANCOREADY) IVPB 2000 mg/400 mL (2,000 mg Intravenous New Bag/Given 09/13/21 1738)  furosemide (LASIX) injection 40 mg (40 mg Intravenous Given 09/13/21 1609)  potassium chloride SA (KLOR-CON M) CR tablet 40 mEq (40 mEq Oral Given 09/13/21 1609)  sodium chloride 0.9 % bolus 500 mL (500 mLs Intravenous New Bag/Given 09/13/21 1533)  0.9 %  sodium chloride infusion ( Intravenous New Bag/Given 09/13/21 1534)  sodium chloride 0.9 % bolus 500 mL (500 mLs Intravenous New Bag/Given 09/13/21 1600)  pantoprazole (PROTONIX) 80 mg /NS 100 mL IVPB (80 mg Intravenous New Bag/Given 09/13/21 1840)  ondansetron (ZOFRAN) injection 4 mg (4 mg Intravenous Given 09/13/21 1734)    ED Course  I have reviewed the triage vital signs and the nursing notes.  Pertinent labs & imaging results that were available during my care of the patient were reviewed by me and considered in my medical decision making (see chart for details).    MDM Rules/Calculators/A&P                           55 year old male presents to the emergency department today for evaluation of hyperglycemia and fall.  Patient's blood sugar was in the 50s upon EMS arrival and he was given 15 of oral glucagon.  His blood sugars increased after that and administration of ginger ale.  Patient reports decreased intake for the last few days but has still been taking insulin.  He laid on the floor for several hours.  He was noted to be significantly hypothermic and was started on a Retail banker.  He was started on IV fluids as well as broad-spectrum IV antibiotics due to concern for possible underlying infection.  His blood pressure remained stable throughout his evaluation.  He did have a leukocytosis of 19,000, no significant anemia.  His CMP was largely unremarkable.  His CK was elevated showing early rhabdomyolysis.  His BNP was also elevated significantly more than his baseline suggesting new onset heart failure.  He did not have any pulmonary edema on his chest x-ray however does look grossly edematous on exam, this also may be due to the fact that his albumin is significantly low.  Additionally he had an episode of coffee-ground emesis while in the emergency department after admission.  He was started on a PPI drip and bolus.  Patient will be admitted to the hospitalist service for further treatment of heart failure, rhabdomyolysis, persistent hypothermia.  4:38 PM CONSULT with Dr. Lavella Lemons who accepts patient for admission   [facility] was made aware of pt and will consult.   Final Clinical Impression(s) / ED Diagnoses Final diagnoses:  Hypothermia, initial encounter  Non-traumatic rhabdomyolysis    Rx / DC Orders ED Discharge Orders     None        Bishop Dublin 09/13/21 2218    Fredia Sorrow, MD 09/20/21 [phone]

## 2021-09-13 NOTE — ED Triage Notes (Addendum)
Per EMS-coming from home-fell 2 hours ago at home-was on floor when EMS arrived-initial CBG 56-oral 15 grams of glucose and ginger ale given-now CBG 70-patient stated he has not eaten in 2 days although he has been taking his insulin

## 2021-09-13 NOTE — ED Notes (Signed)
Unable to obtain oral or axillary temperature, pt appears soaked in sweat and unclean. Pt states he is cold, given warm blankets. Due to triage and situation, unable to obtain rectal temp at this time. Pt alert and oriented x4.

## 2021-09-14 DIAGNOSIS — M6282 Rhabdomyolysis: Secondary | ICD-10-CM

## 2021-09-14 DIAGNOSIS — K7030 Alcoholic cirrhosis of liver without ascites: Secondary | ICD-10-CM

## 2021-09-14 DIAGNOSIS — T68XXXD Hypothermia, subsequent encounter: Secondary | ICD-10-CM

## 2021-09-14 LAB — GLUCOSE, CAPILLARY
Glucose-Capillary: 119 mg/dL — ABNORMAL HIGH (ref 70–99)
Glucose-Capillary: 172 mg/dL — ABNORMAL HIGH (ref 70–99)
Glucose-Capillary: 227 mg/dL — ABNORMAL HIGH (ref 70–99)
Glucose-Capillary: 68 mg/dL — ABNORMAL LOW (ref 70–99)
Glucose-Capillary: 76 mg/dL (ref 70–99)
Glucose-Capillary: 93 mg/dL (ref 70–99)
Glucose-Capillary: 93 mg/dL (ref 70–99)

## 2021-09-14 LAB — COMPREHENSIVE METABOLIC PANEL
ALT: 35 U/L (ref 0–44)
AST: 78 U/L — ABNORMAL HIGH (ref 15–41)
Albumin: <1.5 g/dL — ABNORMAL LOW (ref 3.5–5.0)
Alkaline Phosphatase: 105 U/L (ref 38–126)
Anion gap: 12 (ref 5–15)
BUN: 6 mg/dL (ref 6–20)
CO2: 23 mmol/L (ref 22–32)
Calcium: 7.2 mg/dL — ABNORMAL LOW (ref 8.9–10.3)
Chloride: 106 mmol/L (ref 98–111)
Creatinine, Ser: 0.88 mg/dL (ref 0.61–1.24)
GFR, Estimated: >60 mL/min (ref >60)
Glucose, Bld: 62 mg/dL — ABNORMAL LOW (ref 70–99)
Potassium: 3.6 mmol/L (ref 3.5–5.1)
Sodium: 141 mmol/L (ref 135–145)
Total Bilirubin: 0.8 mg/dL (ref 0.3–1.2)
Total Protein: 5.4 g/dL — ABNORMAL LOW (ref 6.5–8.1)

## 2021-09-14 LAB — CBC
HCT: 33.6 % — ABNORMAL LOW (ref 39.0–52.0)
Hemoglobin: 10.9 g/dL — ABNORMAL LOW (ref 13.0–17.0)
MCH: 29 pg (ref 26.0–34.0)
MCHC: 32.4 g/dL (ref 30.0–36.0)
MCV: 89.4 fL (ref 80.0–100.0)
Platelets: 366 10*3/uL (ref 150–400)
RBC: 3.76 MIL/uL — ABNORMAL LOW (ref 4.22–5.81)
RDW: 15.7 % — ABNORMAL HIGH (ref 11.5–15.5)
WBC: 19.4 10*3/uL — ABNORMAL HIGH (ref 4.0–10.5)
nRBC: 0 % (ref 0.0–0.2)

## 2021-09-14 LAB — MAGNESIUM: Magnesium: 1.8 mg/dL (ref 1.7–2.4)

## 2021-09-14 LAB — MRSA NEXT GEN BY PCR, NASAL: MRSA by PCR Next Gen: NOT DETECTED

## 2021-09-14 LAB — TROPONIN I (HIGH SENSITIVITY)
Troponin I (High Sensitivity): 244 ng/L (ref <18)
Troponin I (High Sensitivity): 86 ng/L — ABNORMAL HIGH (ref <18)
Troponin I (High Sensitivity): 83 ng/L — ABNORMAL HIGH (ref <18)

## 2021-09-14 LAB — CK: Total CK: 1097 U/L — ABNORMAL HIGH (ref 49–397)

## 2021-09-14 MED ORDER — ENSURE ENLIVE PO LIQD
237.0000 mL | Freq: Two times a day (BID) | ORAL | Status: DC
  Administered 2021-09-14 – 2021-09-17 (×5): 237 mL via ORAL

## 2021-09-14 MED ORDER — DEXTROSE 50 % IV SOLN
INTRAVENOUS | Status: AC
  Filled 2021-09-14: qty 50

## 2021-09-14 MED ORDER — PROSOURCE PLUS PO LIQD
30.0000 mL | Freq: Every day | ORAL | Status: DC
  Administered 2021-09-15 – 2021-09-16 (×2): 30 mL via ORAL
  Filled 2021-09-14 (×2): qty 30

## 2021-09-14 MED ORDER — DEXTROSE 50 % IV SOLN
12.5000 g | INTRAVENOUS | Status: AC
  Administered 2021-09-14: 12.5 g via INTRAVENOUS

## 2021-09-14 NOTE — Progress Notes (Signed)
Initial Nutrition Assessment  DOCUMENTATION CODES:   Obesity unspecified  INTERVENTION:  - will order Ensure Enlive BID, each supplement provides 350 kcal and 20 grams of protein. - will order order 30 ml Prosource Plus once/day, each supplement provides 100 kcal and 15 grams protein.    NUTRITION DIAGNOSIS:   Increased nutrient needs related to acute illness as evidenced by estimated needs.  GOAL:   Patient will meet greater than or equal to 90% of their needs  MONITOR:   PO intake, Supplement acceptance, Labs, Weight trends  REASON FOR ASSESSMENT:   Malnutrition Screening Tool  ASSESSMENT:   55 year old male who ambulates with the help of a cane and walker and has a medical history of HTN, venous insufficiency, cardiac arrest, anxiety, high cholesterol, OSA on CPAP, type 2 DM, GERD, seizures, and alcohol abuse. He was admitted on 10/23-11/4. Patient [facility] the ED from SNF for syncope 2/2 dehydration and severe electrolyte disturbances.  Diet advanced from NPO to Soft today at 1348.   Patient was sleeping during visit this AM with no visitors present at that time. Able to do most areas of NFPE during that time.   He was last seen by a [place] RD on 08/01/21 and 08/08/21. On 10/26 he had reported poor appetite for 6-7 years.he was eating 100% at most meals during that hospitalization.   Weight today is 249 lb and weight is stable over the past 1.5 months. On 01/17/21 he weighed 255 lb.    Labs reviewed; CBGs: 93, 93, 76 mg/dl, Ca: 7.2 mg/dl.  Medications reviewed; 1 mg folvite/day, 40 mg IV lasix x1 dose 12/8, 4 g IV Mg sulfate x1 run 12/8, 1 tablet multivitamin with minerals/day, 8 mg/rh protonix/day, 40 mEq Klor-Con x1 dose 12/8, 100 mg oral thiamine/day.    NUTRITION - FOCUSED PHYSICAL EXAM:  No muscle or fat depletions, mild pitting edema to all extremities.   Diet Order:   Diet Order             DIET SOFT Room service appropriate? Yes; Fluid  consistency: Thin  Diet effective now                   EDUCATION NEEDS:   No education needs have been identified at this time  Skin:  Skin Assessment: Reviewed RN Assessment  Last BM:  PTA/unknown  Height:   Ht Readings from Last 1 Encounters:  09/13/21 5\' 10"  (1.778 m)    Weight:   Wt Readings from Last 1 Encounters:  09/14/21 113 kg     Estimated Nutritional Needs:  Kcal:  2200-2400 kcal Protein:  115-125 grams Fluid:  >/= 2.8 L/day      Jarome Matin, MS, RD, LDN, CNSC Inpatient Clinical Dietitian RD pager # available in [facility]  After hours/weekend pager # available in [facility]

## 2021-09-14 NOTE — Plan of Care (Signed)
  Problem: Nutrition: Goal: Adequate nutrition will be maintained Outcome: Not Progressing   C/o nausea and vomiting. Currently NPO

## 2021-09-14 NOTE — Evaluation (Signed)
Physical Therapy Evaluation Patient Details Name: Jeffrey [name] MRN: [id number] DOB: July 08, 1966 Today's Date: 09/14/2021  History of Present Illness  55 year old male, reportedly lives with his adult children, ambulates with the help of a cane and a walker, recently discharged from SNF a few days ago, extensive past medical history including alcohol use disorder, seizure disorder, history of PEA arrest and anoxic brain injury, type II DM/IDDM, HTN, chronic diastolic CHF, GI bleeding, GERD, multiple hospitalizations and the most recent 1 between 07/29/2021 - 08/10/2021 for syncope secondary to C. difficile diarrhea, dehydration, severe electrolyte disturbances, presented to the [facility] ED 09/13/2021 following a fall. Patient admitted with hypothermia and hypoglycemia.  Clinical Impression  Pt admitted as above and presenting with functional mobility limitations 2* generalized weakness and orthostatic hypotension with attempts at OOB activity.  With control of BP, pt should progress to dc home with family assist.     Recommendations for follow up therapy are one component of a multi-disciplinary discharge planning process, led by the attending physician.  Recommendations may be updated based on patient status, additional functional criteria and insurance authorization.  Follow Up Recommendations Home health PT    Assistance Recommended at Discharge Intermittent Supervision/Assistance  Functional Status Assessment Patient has had a recent decline in their functional status and demonstrates the ability to make significant improvements in function in a reasonable and predictable amount of time.  Equipment Recommendations  None recommended by PT    Recommendations for Other Services       Precautions / Restrictions Precautions Precautions: Fall Restrictions Weight Bearing Restrictions: No      Mobility  Bed Mobility Overal bed mobility: Needs Assistance Bed Mobility: Supine  to Sit;Sit to Supine     Supine to sit: Mod assist;+2 for physical assistance;HOB elevated Sit to supine: Mod assist;+2 for physical assistance   General bed mobility comments: Assist to bring trunk to upright and to bring LEs on/off bed    Transfers Overall transfer level: Needs assistance Equipment used: Rolling walker (2 wheels) Transfers: Sit to/from Stand Sit to Stand: Min assist;+2 safety/equipment           General transfer comment: Min assist with walker to rise from bed. Able to take steps to head of the bed. Limited by low BPs.    Ambulation/Gait Ambulation/Gait assistance: Min assist Gait Distance (Feet): 3 Feet Assistive device: Rolling walker (2 wheels) Gait Pattern/deviations: Step-to pattern;Decreased step length - right;Decreased step length - left;Shuffle Gait velocity: decr     General Gait Details: Pt side-stepped up side of bed only 2* orthostatic BP  Stairs            Wheelchair Mobility    Modified Rankin (Stroke Patients Only)       Balance Overall balance assessment: Mild deficits observed, not formally tested                                           Pertinent Vitals/Pain Pain Assessment: No/denies pain    Home Living Family/patient expects to be discharged to:: Private residence Living Arrangements: Children Available Help at Discharge: Family Type of Home: House Home Access: Stairs to enter Entrance Stairs-Rails: Right Entrance Stairs-Number of Steps: 3   Home Layout: One level Home Equipment: Merchant navy officer (4 wheels);Adaptive equipment;Tub bench;Rolling Walker (2 wheels) Additional Comments: just left [facility] in [place], [place]    Prior Function  Prior Level of Function : Independent/Modified Independent             Mobility Comments: uses a rollator ADLs Comments: independent     Hand Dominance   Dominant Hand: Right    Extremity/Trunk Assessment   Upper Extremity Assessment Upper  Extremity Assessment: Overall WFL for tasks assessed    Lower Extremity Assessment Lower Extremity Assessment: Generalized weakness    Cervical / Trunk Assessment Cervical / Trunk Assessment: Normal  Communication   Communication: HOH  Cognition Arousal/Alertness: Awake/alert Behavior During Therapy: WFL for tasks assessed/performed Overall Cognitive Status: Within Functional Limits for tasks assessed                                          General Comments      Exercises     Assessment/Plan    PT Assessment Patient needs continued PT services  PT Problem List Decreased strength;Decreased range of motion;Decreased activity tolerance;Decreased balance;Decreased mobility;Decreased knowledge of use of DME;Obesity       PT Treatment Interventions DME instruction;Gait training;Stair training;Functional mobility training;Therapeutic activities;Therapeutic exercise;Patient/family education    PT Goals (Current goals can be found in the Care Plan section)  Acute Rehab PT Goals Patient Stated Goal: HOME PT Goal Formulation: With patient Time For Goal Achievement: 09/28/21 Potential to Achieve Goals: Good    Frequency Min 3X/week   Barriers to discharge        Co-evaluation PT/OT/SLP Co-Evaluation/Treatment: Yes Reason for Co-Treatment: For patient/therapist safety PT goals addressed during session: Mobility/safety with mobility OT goals addressed during session: ADL's and self-care       AM-PAC PT "6 Clicks" Mobility  Outcome Measure Help needed turning from your back to your side while in a flat bed without using bedrails?: A Little Help needed moving from lying on your back to sitting on the side of a flat bed without using bedrails?: A Lot Help needed moving to and from a bed to a chair (including a wheelchair)?: A Lot Help needed standing up from a chair using your arms (e.g., wheelchair or bedside chair)?: A Lot Help needed to walk in hospital  room?: Total Help needed climbing 3-5 steps with a railing? : Total 6 Click Score: 11    End of Session Equipment Utilized During Treatment: Gait belt Activity Tolerance: Patient tolerated treatment well Patient left: in bed;with call bell/phone within reach;with nursing/sitter in room;with bed alarm set Nurse Communication: Mobility status PT Visit Diagnosis: Unsteadiness on feet (R26.81);Muscle weakness (generalized) (M62.81)    Time: 2595-6387 PT Time Calculation (min) (ACUTE ONLY): 34 min   Charges:   PT Evaluation $PT Eval Low Complexity: 1 Low          [place] Pager [phone] Office [phone]   Leovardo Thoman 09/14/2021, 5:13 PM

## 2021-09-14 NOTE — Progress Notes (Addendum)
PROGRESS NOTE   Jeffrey [name]  TLX:[id number]    DOB: Jul 03, 1966    DOA: 09/13/2021  PCP: [name], MD   I have briefly reviewed patients previous medical records in [facility].  Chief Complaint:  Fall, hypoglycemia and hypothermia   Brief Narrative:  55 year old male, reportedly lives with his adult children, ambulates with the help of a cane and a walker, recently discharged from SNF a few days ago, extensive past medical history including alcohol use disorder, seizure disorder, history of PEA arrest and anoxic brain injury, type II DM/IDDM, HTN, chronic diastolic CHF, GI bleeding, GERD, multiple hospitalizations and the most recent 1 between 07/29/2021 - 08/10/2021 for syncope secondary to C. difficile diarrhea, dehydration, severe electrolyte disturbances, presented to the [facility] ED 09/13/2021 following a fall 2 hours prior to ED arrival.  Per EMS, patient was found on the floor, initial CBG of 56, resuscitated with oral 15 g of glucose and ginger ale, subsequent CBG 70, in ED CBG of 83.  In ED noted to be hypothermic of 94 F and 93 F rectally and had an episode of coffee-ground emesis.  Admitted to stepdown unit for evaluation and management of hypothermia, ruling out sepsis, type II DM with hypoglycemia, coffee-ground emesis x1.  Hypothermia resolved.  [facility] consulted and input pending.   Assessment & Plan:  Principal Problem:   Hypothermia Active Problems:   HYPERCHOLESTEROLEMIA   Hypoglycemia   Anoxic brain injury ([place])   Cirrhosis ([place])   Essential hypertension   Hypokalemia   Hyperlipidemia associated with type 2 diabetes mellitus (HCC)   GI bleed   Recurrent falls   Hypothermia: Differential diagnosis: Related to hypoglycemia, low index of suspicion for sepsis but cannot be ruled out versus other etiologies.  Treated with Bair hugger, warm IV fluids, monitor temperature closely and frequently.  TSH 3.73.  Random cortisol 30.4.  Blood  cultures pending.  We will add urine culture to admission UA sample.  On empiric IV cefepime and vancomycin pending culture results.  Hypothermia resolved overnight of admission and off Bair hugger.  Monitor.   Type II DM/IDDM with hypoglycemia: Likely related to poor oral intake and insulins.  Resuscitated with oral intake by EMS.  Monitor CBGs closely and initiate SSI only if CBGs consistently >180.  Had hypoglycemic episode early this morning, 68 and since then no further episodes of documented hypoglycemia.  Diet now initiated by GI.   Coffee-ground emesis: Appears that has had prior history of GI bleed, multiple procedures.  N.p.o. pending GI evaluation.  Treated with PPI bolus and IV PPI infusion.  Hemoglobin stable.  Likely low volume bleeding. DD: Esophagitis, gastritis, ulcers, esophageal varices (were not present on EGD in January 2022) versus others. INR 1.4. Reviewed most recent EGD report in January 2022 which showed gastritis, erosive esophagitis and duodenal ulcer but no esophageal varices or portal hypertensive gastropathy and hence no octreotide started.  KUB to rule out ileus was negative.  As per GI follow-up, history of multiple EGDs for coffee-ground emesis, most recently early this year with persistent/recurrent finding of reflux esophagitis, reportedly ran out of his PPI couple weeks ago, recommend IV PPI twice daily x24-48 hours, clear liquid diet, do not see need to repeat endoscopy at this time and they will follow.   Hypokalemia: Replaced.  Follow BMP in AM.  Follow and replace magnesium as needed.  Hypomagnesemia: Magnesium 0.9 on 12/8.  S/p 4 g IV magnesium replaced.  Repeat magnesium 1.8.  Leukocytosis: Unclear etiology.  Could be stress response versus sepsis.  Follow daily CBCs.   Anemia: Hemoglobin actually appears to be better than his baseline.  This may be related to hemoconcentration.  Follow CBC daily.  Stable.   History of seizures: No reported history of  seizures.  Continue Keppra 1 g twice daily.  Seizure precautions.   Anasarca: Likely multifactorial due to poor nutritional status, cirrhosis, chronic diastolic CHF.  Registered dietitian consulted.  Consider Lasix and Aldactone after GI input.   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 Mild coagulopathy. Anasarca. Hold IVF S/P IV Lasix in ED. Consider starting Lasix & Aldactone.   Falls: PT & OT eval.  Appears that he just got out of rehab.  Patient had left periorbital swelling on admission.  Obtain CT head and without acute findings.  Blood alcohol level less than 10.  Placed on CIWA protocol but low index of suspicion for ongoing alcohol use since just came out of rehab.   HLD: Holding pta.   HTN: Hold meds d/t GI bleed to avoid hypotension.  Intermittent soft blood pressures.   Elevated CK: may be related to fall and Cirrhosis. Cannot give IVF d/t anasarca.  CK down from 2868 > 1097.  Elevated troponin: No anginal symptoms.  Could be due to elevated CK/rhabdomyolysis.  Suspect demand ischemia from hypothermia and hypoglycemia on presentation.  Trend troponin's, has decreased from 244 to 80s range coinciding with decreasing CK as well.  Adult failure to thrive: Multifactorial.  Consulted palliative care team for goals of care discussions.   Body mass index is 35.74 kg/m.  Obesity    DVT prophylaxis: SCDs Start: 09/13/21 2006     Code Status: Full Code Family Communication: None at bedside. Disposition:  Status is: Inpatient  Remains inpatient appropriate because: Severity of illness, ongoing hypoglycemia, need for further evaluation of GI bleed.        Consultants:   [facility]  Procedures:   None  Antimicrobials:   IV cefepime and vancomycin 12/8 > IV Flagyl 12/8 x 1   Subjective:  Interviewed and examined patient along with his nurse in the room.  Reportedly came off the Bair hugger once he came to the stepdown unit last night.  Feels cold.  Temperatures normal.  No further  nausea or vomiting since ED.  Denies pain or any other complaints.  No dyspnea.  Objective:   Vitals:   09/14/21 1000 09/14/21 1100 09/14/21 1200 09/14/21 1300  BP: 124/63 137/81 139/85 136/79  Pulse: 94 96 99 100  Resp: (!) 21 (!) 3 11 (!) 24  Temp:   98 F (36.7 C)   TempSrc:   Oral   SpO2: 100% 100% 100% 100%  Weight:      Height:        General exam: Young male, moderately built and obese lying comfortably supine in bed without distress.  Oral mucosa moist.  Looks much better than he did yesterday evening in ED. Eyes: Left periorbital swelling has almost resolved.  Right mature cataract with blindness.  Left intraocular lens. Respiratory system: Clear to auscultation. Respiratory effort normal. Cardiovascular system: S1 & S2 heard, RRR. No JVD, murmurs, rubs, gallops or clicks. No pedal edema. Gastrointestinal system: Abdomen is obese but nondistended, soft and nontender. No organomegaly or masses felt. Normal bowel sounds heard. Central nervous system: Alert and oriented. No focal neurological deficits. Extremities: Symmetric 5 x 5 power. Skin: No rashes, lesions or ulcers Psychiatry: Judgement and insight appear somewhat impaired. Mood &  affect appropriate.     Data Reviewed:   I have personally reviewed following labs and imaging studies   CBC: Recent Labs  Lab 09/13/21 1400 09/13/21 2042 09/14/21 0239  WBC 19.1* 17.2* 19.4*  NEUTROABS 16.7*  --   --   HGB 11.9* 10.9* 10.9*  HCT 36.2* 32.7* 33.6*  MCV 86.6 86.1 89.4  PLT 461* 358 711    Basic Metabolic Panel: Recent Labs  Lab 09/13/21 1400 09/13/21 2042 09/14/21 0239 09/14/21 0819  NA 136  --  141  --   K 3.2*  --  3.6  --   CL 101  --  106  --   CO2 25  --  23  --   GLUCOSE 110*  --  62*  --   BUN 6  --  6  --   CREATININE 0.75  --  0.88  --   CALCIUM 7.7*  --  7.2*  --   MG  --  0.9*  --  1.8    Liver Function Tests: Recent Labs  Lab 09/13/21 1400 09/14/21 0239  AST 110* 78*  ALT 43 35   ALKPHOS 141* 105  BILITOT 0.3 0.8  PROT 6.9 5.4*  ALBUMIN 1.8* <1.5*    CBG: Recent Labs  Lab 09/14/21 0602 09/14/21 0811 09/14/21 1240  GLUCAP 93 93 76    Microbiology Studies:   Recent Results (from the past 240 hour(s))  Resp Panel by RT-PCR (Flu A&B, Covid) Nasopharyngeal Swab     Status: None   Collection Time: 09/13/21  2:00 PM   Specimen: Nasopharyngeal Swab; Nasopharyngeal(NP) swabs in vial transport medium  Result Value Ref Range Status   SARS Coronavirus 2 by RT PCR NEGATIVE NEGATIVE Final    Comment: (NOTE) SARS-CoV-2 target nucleic acids are NOT DETECTED.  The SARS-CoV-2 RNA is generally detectable in upper respiratory specimens during the acute phase of infection. The lowest concentration of SARS-CoV-2 viral copies this assay can detect is 138 copies/mL. A negative result does not preclude SARS-Cov-2 infection and should not be used as the sole basis for treatment or other patient management decisions. A negative result may occur with  improper specimen collection/handling, submission of specimen other than nasopharyngeal swab, presence of viral mutation(s) within the areas targeted by this assay, and inadequate number of viral copies(<138 copies/mL). A negative result must be combined with clinical observations, patient history, and epidemiological information. The expected result is Negative.  Fact Sheet for Patients:  EntrepreneurPulse.com.au  Fact Sheet for Healthcare Providers:  IncredibleEmployment.be  This test is no t yet approved or cleared by the [place] FDA and  has been authorized for detection and/or diagnosis of SARS-CoV-2 by FDA under an Emergency Use Authorization (EUA). This EUA will remain  in effect (meaning this test can be used) for the duration of the COVID-19 declaration under Section 564(b)(1) of the Act, 21 U.S.C.section 360bbb-3(b)(1), unless the authorization is terminated  or revoked  sooner.       Influenza A by PCR NEGATIVE NEGATIVE Final   Influenza B by PCR NEGATIVE NEGATIVE Final    Comment: (NOTE) The Xpert Xpress SARS-CoV-2/FLU/RSV plus assay is intended as an aid in the diagnosis of influenza from Nasopharyngeal swab specimens and should not be used as a sole basis for treatment. Nasal washings and aspirates are unacceptable for Xpert Xpress SARS-CoV-2/FLU/RSV testing.  Fact Sheet for Patients: EntrepreneurPulse.com.au  Fact Sheet for Healthcare Providers: IncredibleEmployment.be  This test is not yet approved or cleared by the  [place] Architectural technologist and has been authorized for detection and/or diagnosis of SARS-CoV-2 by FDA under an Print production planner (EUA). This EUA will remain in effect (meaning this test can be used) for the duration of the COVID-19 declaration under Section 564(b)(1) of the Act, 21 U.S.C. section 360bbb-3(b)(1), unless the authorization is terminated or revoked.  Performed at [facility], [place] [address] [place], [place] [address]   Blood Culture (routine x 2)     Status: None (Preliminary result)   Collection Time: 09/13/21  2:05 PM   Specimen: BLOOD  Result Value Ref Range Status   Specimen Description   Final    BLOOD RIGHT ANTECUBITAL Performed at [facility] [address], [place], [name] [address]    Special Requests   Final    BOTTLES DRAWN AEROBIC AND ANAEROBIC Blood Culture adequate volume Performed at [facility] [address], [place], [place] [address]    Culture   Final    NO GROWTH < 24 HOURS Performed at [facility] [address], [place], [place] [address]    Report Status PENDING  Incomplete  MRSA Next Gen by PCR, Nasal     Status: None   Collection Time: 09/13/21  7:52 PM   Specimen: Nasal Mucosa; Nasal Swab  Result Value Ref Range Status   MRSA by PCR Next Gen NOT DETECTED NOT DETECTED  Final    Comment: (NOTE) The GeneXpert MRSA Assay (FDA approved for NASAL specimens only), is one component of a comprehensive MRSA colonization surveillance program. It is not intended to diagnose MRSA infection nor to guide or monitor treatment for MRSA infections. Test performance is not FDA approved in patients less than 12 years old. Performed at [facility], [place] [address] [place], [place] [address]   Blood Culture (routine x 2)     Status: None (Preliminary result)   Collection Time: 09/13/21  8:42 PM   Specimen: BLOOD  Result Value Ref Range Status   Specimen Description   Final    BLOOD BLOOD LEFT HAND Performed at [facility] [address] [place], [place] [address]    Special Requests   Final    BOTTLES DRAWN AEROBIC ONLY Blood Culture adequate volume Performed at [facility] [address] [place], [place] [address]    Culture   Final    NO GROWTH < 12 HOURS Performed at [facility] [address], [place], [place] [address]    Report Status PENDING  Incomplete    Radiology Studies:  CT HEAD WO CONTRAST (5MM)  Result Date: 09/13/2021 CLINICAL DATA:  Syncope/presyncope, cerebrovascular cause suspected EXAM: CT HEAD WITHOUT CONTRAST TECHNIQUE: Contiguous axial images were obtained from the base of the skull through the vertex without intravenous contrast. COMPARISON:  None. BRAIN: BRAIN Cerebral ventricle sizes are concordant with the degree of cerebral volume loss. No evidence of large-territorial acute infarction. No parenchymal hemorrhage. No mass lesion. No extra-axial collection. No mass effect or midline shift. No hydrocephalus. Basilar cisterns are patent. Vascular: No hyperdense vessel. Skull: No acute fracture or focal lesion. Sinuses/Orbits: Paranasal sinuses and mastoid air cells are clear. Left lens replacement. Otherwise orbits are unremarkable. Other: None. IMPRESSION: No acute  intracranial abnormality. Electronically Signed   By: Iven Finn M.D.   On: 09/13/2021 17:54   DG Chest Port 1 View  Result Date: 09/13/2021 CLINICAL DATA:  Possible sepsis. EXAM: PORTABLE CHEST 1  VIEW COMPARISON:  Chest x-ray dated July 29, 2021. FINDINGS: The heart size and mediastinal contours are within normal limits. Both lungs are clear. The visualized skeletal structures are unremarkable. IMPRESSION: No active disease. Electronically Signed   By: Titus Dubin M.D.   On: 09/13/2021 15:48   DG Abd Portable 1V  Result Date: 09/13/2021 CLINICAL DATA:  Nausea and vomiting EXAM: PORTABLE ABDOMEN - 1 VIEW COMPARISON:  CT abdomen pelvis 11/01/2020 FINDINGS: Prominent loop of small bowel within the right mid abdomen with no definite dilatation. The bowel gas pattern is normal. No radio-opaque calculi or other significant radiographic abnormality are seen. IMPRESSION: Negative. Electronically Signed   By: Iven Finn M.D.   On: 09/13/2021 17:39    Scheduled Meds:    (feeding supplement) PROSource Plus  30 mL Oral Daily   Chlorhexidine Gluconate Cloth  6 each Topical Daily   feeding supplement  237 mL Oral BID BM   folic acid  1 mg Oral Daily   levETIRAcetam  1,000 mg Oral BID   multivitamin with minerals  1 tablet Oral Daily   sodium chloride flush  3 mL Intravenous Q12H   thiamine  100 mg Oral Daily   Or   thiamine  100 mg Intravenous Daily    Continuous Infusions:    ceFEPime (MAXIPIME) IV Stopped (09/14/21 1601)   pantoprazole 8 mg/hr (09/14/21 0817)   vancomycin Stopped (09/14/21 0702)     LOS: 1 day     Vernell Leep, MD,  FACP, [facility], [facility], [facility] (Care Management Physician Certified) [place]  To contact the attending provider between 7A-7P or the covering provider during after hours 7P-7A, please log into the web site [email or web address] and access using universal [place] password for that web site. If you do not have the  password, please call the hospital operator.  09/14/2021, 4:09 PM

## 2021-09-14 NOTE — Consult Note (Signed)
Referring Provider: [place] Primary Care Physician:  [name], MD Primary Gastroenterologist:  Althia Forts  Reason for Consultation:  recurrent coffee ground emesis  HPI: Jeffrey [name] is a 55 y.o. male past medical history including alcohol use disorder, seizure disorder, history of PEA arrest and anoxic brain injury, type II DM/IDDM, HTN, chronic diastolic CHF, GI bleeding, GERD, multiple hospitalizations and the most recent 1 between 07/29/2021 - 08/10/2021 for syncope secondary to C. difficile diarrhea, dehydration, severe electrolyte disturbances presents for recurrent coffee ground emesis.  Patient originally presented to ED following a fall in addition to hypoglycemia and hypothermia.  witnessed coffee-ground emesis in the ED.  Patient states yesterday he began to having persistent nausea and vomiting. Mainly coffee ground emesis. States this has happened to him before in 2016. EGD then with Dr. Fuller Plan showed LA class C esophagitis, hiatal hernia, and mild duodenitis. Patient states he recently got out of rehab to learn to walk again. States he has severe acid reflux and had not had his reflux medication since Sunday. Denies nausea and vomiting today. Denies melena/hematochezia. Denies abdominal pain. Reports he has had a significant cough for a few days. Denies NSAID use. States he has a significant alcohol abuse history. Reports being in rehab for learning how to walk again "set him straight" and he has no desire to continue drinking.  EGD 10/2020 by Dr. Henrene Pastor: Severe erosive esophagitis, mild gastritis, no varices, duodenal ulcers with suggestion of ischemia, no active bleeding  No prior history of colonoscopy. Denies family history of colon cancer.  Past Medical History:  Diagnosis Date   Acute renal insufficiency 12/08/2012   AKI (acute kidney injury) ([place])    ALCOHOL ABUSE, HX OF 11/10/2007   Anoxic encephalopathy ([place])    Anxiety    Bleeding ulcer 2014   Blood transfusion 2014    "related to bleeding ulcer"   Cardiac arrest ([place]) 12/07/2012   Anoxic encephalopathy   GERD (gastroesophageal reflux disease)    High cholesterol    Hypertension    OSA on CPAP 12/07/2012   Seizure-like activity ([place])    Seizures ([place])    last seizure 01/05/2021   Type II diabetes mellitus ([place])    Venous insufficiency 12/13/2011    Past Surgical History:  Procedure Laterality Date   BIOPSY  11/01/2020   Procedure: BIOPSY;  Surgeon: Irene Shipper, MD;  Location: [facility] ENDOSCOPY;  Service: Endoscopy;;   CARDIOVASCULAR STRESS TEST  03/16/13   Very Poor Exercise Tolerance; NON DIAGNOSTIC TEST   CATARACT EXTRACTION W/ INTRAOCULAR LENS IMPLANT Left 03/07/2014   [place] @ [facility]   ESOPHAGOGASTRODUODENOSCOPY Left 11/26/2012   Procedure: ESOPHAGOGASTRODUODENOSCOPY (EGD);  Surgeon: Wonda Horner, MD;  Location: [facility] ENDOSCOPY;  Service: Endoscopy;  Laterality: Left;   ESOPHAGOGASTRODUODENOSCOPY N/A 10/10/2014   Procedure: ESOPHAGOGASTRODUODENOSCOPY (EGD);  Surgeon: Ladene Artist, MD;  Location: [facility] ENDOSCOPY;  Service: Endoscopy;  Laterality: N/A;   ESOPHAGOGASTRODUODENOSCOPY (EGD) WITH PROPOFOL N/A 11/01/2020   Procedure: ESOPHAGOGASTRODUODENOSCOPY (EGD) WITH PROPOFOL;  Surgeon: Irene Shipper, MD;  Location: [facility] ENDOSCOPY;  Service: Endoscopy;  Laterality: N/A;   KNEE ARTHROSCOPY Left ~ [place]      Prior to Admission medications   Medication Sig Start Date End Date Taking? Authorizing Provider  aspirin EC 81 MG tablet Take 1 tablet (81 mg total) by mouth at bedtime. Patient not taking: Reported on 09/14/2021 06/24/19   Zenia Resides, MD  atorvastatin (LIPITOR) 80 MG tablet Take 80 mg  by mouth at bedtime. Patient not taking: Reported on 09/14/2021 05/18/21   [provider]  Continuous Blood Gluc Sensor (DEXCOM G6 SENSOR) MISC 1 Units by Does not apply route as directed. 09/26/20   [name], MD  Continuous Blood Gluc Transmit (DEXCOM G6  TRANSMITTER) MISC Use every 90 days 10/05/20   [name], MD  dapagliflozin propanediol (FARXIGA) 10 MG TABS tablet Take 10 mg by mouth daily. Patient not taking: Reported on 09/14/2021    [provider]  Ensure Max Protein (ENSURE MAX PROTEIN) LIQD Take 330 mLs (11 oz total) by mouth 3 (three) times daily between meals. Patient not taking: Reported on 09/14/2021 08/10/21   Holley Bouche, MD  famotidine (PEPCID) 20 MG tablet Take 1 tablet (20 mg total) by mouth daily. Patient not taking: Reported on 09/14/2021 08/10/21   Holley Bouche, MD  folic acid (FOLVITE) 1 MG tablet Take 1 tablet (1 mg total) by mouth daily. Patient not taking: Reported on 09/14/2021 07/04/20   [name], MD  gabapentin (NEURONTIN) 300 MG capsule Take 1 capsule (300 mg total) by mouth 3 (three) times daily. Patient not taking: Reported on 09/14/2021 08/10/21   Holley Bouche, MD  glucose blood (TRUE METRIX BLOOD GLUCOSE TEST) test strip Please use to check blood sugar up to three times daily.  07/03/20   [name], MD  insulin glargine (LANTUS) 100 unit/mL SOPN Inject 5 Units into the skin daily. Patient not taking: Reported on 09/14/2021 08/10/21   Holley Bouche, MD  levETIRAcetam (KEPPRA) 1000 MG tablet Take 1 tablet by mouth twice daily Patient not taking: Reported on 09/14/2021 09/12/21   [name], MD  metFORMIN (GLUCOPHAGE-XR) 500 MG 24 hr tablet TAKE 1 TABLET BY MOUTH TWICE DAILY WITH A MEAL Patient not taking: Reported on 09/14/2021 07/26/21   [name], MD  Multiple Vitamin (MULTIVITAMIN WITH MINERALS) TABS tablet Take 1 tablet by mouth daily. Patient not taking: Reported on 09/14/2021 08/11/21   Holley Bouche, MD  thiamine (VITAMIN B-1) 100 MG tablet Take 1 tablet (100 mg total) by mouth daily. Patient not taking: Reported on 09/14/2021 05/27/16   [name], DO    Scheduled Meds:  Chlorhexidine Gluconate Cloth  6 each Topical Daily   folic acid  1 mg Oral Daily    levETIRAcetam  1,000 mg Oral BID   multivitamin with minerals  1 tablet Oral Daily   sodium chloride flush  3 mL Intravenous Q12H   thiamine  100 mg Oral Daily   Or   thiamine  100 mg Intravenous Daily   Continuous Infusions:  ceFEPime (MAXIPIME) IV Stopped (09/14/21 0732)   pantoprazole 8 mg/hr (09/14/21 0817)   vancomycin Stopped (09/14/21 0733)   PRN Meds:.acetaminophen **OR** acetaminophen, albuterol, LORazepam **OR** LORazepam, ondansetron **OR** ondansetron (ZOFRAN) IV, polyethylene glycol  Allergies as of 09/13/2021 - Review Complete 09/13/2021  Allergen Reaction Noted   Lisinopril Other (See Comments) 11/30/2009   Atorvastatin  07/29/2021   Drug ingredient [spironolactone]  03/27/2020    Family History  Problem Relation Age of Onset   Other Mother        Unsure of medical history.   Diabetes Mellitus II Father    Colon polyps Neg Hx    Colon cancer Neg Hx    Esophageal cancer Neg Hx    Rectal cancer Neg Hx    Stomach cancer Neg Hx     Social History   Socioeconomic History   Marital status: Married    Spouse name:  Not on file   Number of children: 0   Years of education: Bachelors   Highest education level: Not on file  Occupational History   Occupation: maintenance tech  Tobacco Use   Smoking status: Never   Smokeless tobacco: Current    Types: Snuff  Vaping Use   Vaping Use: Never used  Substance and Sexual Activity   Alcohol use: Yes    Comment: 12 pack beer / week   Drug use: No   Sexual activity: Yes  Other Topics Concern   Not on file  Social History Narrative   Lives at home with wife. He has four stepchildren.    Right-handed.   No caffeine use.   Social Determinants of Health   Financial Resource Strain: Not on file  Food Insecurity: Not on file  Transportation Needs: Not on file  Physical Activity: Not on file  Stress: Not on file  Social Connections: Not on file  Intimate Partner Violence: Not on file    Review of Systems:  Review of Systems  Constitutional:  Negative for chills and fever.  HENT:  Negative for hearing loss and tinnitus.   Eyes:  Negative for blurred vision and double vision.  Respiratory:  Negative for cough and hemoptysis.   Cardiovascular:  Negative for chest pain and palpitations.  Gastrointestinal:  Positive for nausea and vomiting. Negative for abdominal pain, blood in stool, constipation, diarrhea, heartburn and melena.  Genitourinary:  Negative for dysuria and urgency.  Musculoskeletal:  Negative for myalgias and neck pain.  Skin:  Negative for itching and rash.  Psychiatric/Behavioral:  Positive for substance abuse. The patient is not nervous/anxious.     Physical Exam:Physical Exam Constitutional:      Appearance: Normal appearance.  HENT:     Head: Normocephalic and atraumatic.     Nose: Nose normal. No congestion.     Mouth/Throat:     Mouth: Mucous membranes are moist.     Pharynx: Oropharynx is clear.  Eyes:     Extraocular Movements: Extraocular movements intact.     Conjunctiva/sclera: Conjunctivae normal.  Abdominal:     General: Abdomen is flat. Bowel sounds are normal. There is distension.     Palpations: Abdomen is soft. There is no mass.     Tenderness: There is no abdominal tenderness. There is no guarding or rebound.     Hernia: No hernia is present.  Musculoskeletal:        General: No swelling. Normal range of motion.  Skin:    General: Skin is warm.     Coloration: Skin is not jaundiced.  Neurological:     General: No focal deficit present.     Mental Status: He is alert and oriented to person, place, and time.  Psychiatric:        Mood and Affect: Mood normal.        Behavior: Behavior normal.        Thought Content: Thought content normal.        Judgment: Judgment normal.    Vital signs: Vitals:   09/14/21 0700 09/14/21 0800  BP: 126/87   Pulse: (!) 103   Resp: (!) 30   Temp:  97.6 F (36.4 C)  SpO2: 97%         GI:  Lab  Results: Recent Labs    09/13/21 1400 09/13/21 2042 09/14/21 0239  WBC 19.1* 17.2* 19.4*  HGB 11.9* 10.9* 10.9*  HCT 36.2* 32.7* 33.6*  PLT 461* 358 366  BMET Recent Labs    09/13/21 1400 09/14/21 0239  NA 136 141  K 3.2* 3.6  CL 101 106  CO2 25 23  GLUCOSE 110* 62*  BUN 6 6  CREATININE 0.75 0.88  CALCIUM 7.7* 7.2*   LFT Recent Labs    09/14/21 0239  PROT 5.4*  ALBUMIN <1.5*  AST 78*  ALT 35  ALKPHOS 105  BILITOT 0.8   PT/INR Recent Labs    09/13/21 1400  LABPROT 17.5*  INR 1.4*     Studies/Results: CT HEAD WO CONTRAST (5MM)  Result Date: 09/13/2021 CLINICAL DATA:  Syncope/presyncope, cerebrovascular cause suspected EXAM: CT HEAD WITHOUT CONTRAST TECHNIQUE: Contiguous axial images were obtained from the base of the skull through the vertex without intravenous contrast. COMPARISON:  None. BRAIN: BRAIN Cerebral ventricle sizes are concordant with the degree of cerebral volume loss. No evidence of large-territorial acute infarction. No parenchymal hemorrhage. No mass lesion. No extra-axial collection. No mass effect or midline shift. No hydrocephalus. Basilar cisterns are patent. Vascular: No hyperdense vessel. Skull: No acute fracture or focal lesion. Sinuses/Orbits: Paranasal sinuses and mastoid air cells are clear. Left lens replacement. Otherwise orbits are unremarkable. Other: None. IMPRESSION: No acute intracranial abnormality. Electronically Signed   By: Iven Finn M.D.   On: 09/13/2021 17:54   DG Chest Port 1 View  Result Date: 09/13/2021 CLINICAL DATA:  Possible sepsis. EXAM: PORTABLE CHEST 1 VIEW COMPARISON:  Chest x-ray dated July 29, 2021. FINDINGS: The heart size and mediastinal contours are within normal limits. Both lungs are clear. The visualized skeletal structures are unremarkable. IMPRESSION: No active disease. Electronically Signed   By: Titus Dubin M.D.   On: 09/13/2021 15:48   DG Abd Portable 1V  Result Date: 09/13/2021 CLINICAL  DATA:  Nausea and vomiting EXAM: PORTABLE ABDOMEN - 1 VIEW COMPARISON:  CT abdomen pelvis 11/01/2020 FINDINGS: Prominent loop of small bowel within the right mid abdomen with no definite dilatation. The bowel gas pattern is normal. No radio-opaque calculi or other significant radiographic abnormality are seen. IMPRESSION: Negative. Electronically Signed   By: Iven Finn M.D.   On: 09/13/2021 17:39    Impression: Upper GI bleed; esophagitis vs gastritis as PUD - Hgb 10.9 - BUN 6, Cr 0.88 - INR 1.4 - EGD 10/2020 by Dr. Henrene Pastor: Severe erosive esophagitis, mild gastritis, no varices, duodenal ulcers with suggestion of ischemia, no active bleeding. - Past abdominal imaging 1/22: fatty liver and chronic pancreatitis.  DM II  Hypokalemia  Plan: Recent EGD January 2022 showed duodenal ulcers and esophagitis. Likely what patient is experiencing during this admission. Recommend getting him back on daily antacid regimen and reassess need for repeat EGD. Continue protonix Continue supportive care [facility] will follow.   LOS: 1 day   Garnette Scheuermann  PA-C 09/14/2021, 8:40 AM  Contact #  [phone]

## 2021-09-14 NOTE — Progress Notes (Signed)
Upon conversing with patient, patient informed this nurse that two days ago he gave himself insulin for the first time. I asked what his blood sugar was and the patient said 113.   When I asked how much insulin he gave himself to treat that blood sugar he said 13units of regular insulin.   I expressed that more than likely that amount of insulin was to much to treat a blood sugar of 113.   I recommended that before the patient is discharged he be well educated on appropriate/safe insulin administration.

## 2021-09-14 NOTE — Evaluation (Signed)
Occupational Therapy Evaluation Patient Details Name: Jeffrey [name] MRN: [id number] DOB: Feb 10, 1966 Today's Date: 09/14/2021   History of Present Illness 55 year old male, reportedly lives with his adult children, ambulates with the help of a cane and a walker, recently discharged from SNF a few days ago, extensive past medical history including alcohol use disorder, seizure disorder, history of PEA arrest and anoxic brain injury, type II DM/IDDM, HTN, chronic diastolic CHF, GI bleeding, GERD, multiple hospitalizations and the most recent 1 between 07/29/2021 - 08/10/2021 for syncope secondary to C. difficile diarrhea, dehydration, severe electrolyte disturbances, presented to the [facility] ED 09/13/2021 following a fall. Patient admitted with hypothermia and hypoglycemia.   Clinical Impression   Jeffrey [name] is a 55 year old man who recently discharged from short term rehab to home with his children. He reports was modified independent for ADLs with AE and ambulated with rollator. Today he required mod x 2 for bed transfers and min assist to rise from bed with RW. He requires seated and set up position for UB ADLs and max assist for LB ADLs. Patient limited today by orthostatic blood pressures however he remained unsymptomatic, Requested RN in the room to perform manual Bps to verify. Bps are as follows:   BP 137/104 sitting 102/67 sitting (after 3 min) 90/75 standing  69/45  RN performed manual blood pressures on both arms. Patient returned to supine for safety as they continued to be low - though patient really wanted to get back to chair.  BP 97/71  and patient left in RN's are. Patient will benefit from skilled OT services while in hospital to improve deficits and learn compensatory strategies as needed in order to return to PLOF.  Expect patient will progress well while in hospital.      Recommendations for follow up therapy are one component of a multi-disciplinary  discharge planning process, led by the attending physician.  Recommendations may be updated based on patient status, additional functional criteria and insurance authorization.   Follow Up Recommendations  Home health OT    Assistance Recommended at Discharge Frequent or constant Supervision/Assistance  Functional Status Assessment  Patient has had a recent decline in their functional status and demonstrates the ability to make significant improvements in function in a reasonable and predictable amount of time.  Equipment Recommendations  None recommended by OT    Recommendations for Other Services       Precautions / Restrictions Precautions Precautions: Fall Restrictions Weight Bearing Restrictions: No      Mobility Bed Mobility Overal bed mobility: Needs Assistance Bed Mobility: Supine to Sit;Sit to Supine     Supine to sit: Mod assist;+2 for physical assistance;HOB elevated Sit to supine: Mod assist;+2 for physical assistance        Transfers Overall transfer level: Needs assistance Equipment used: Rolling walker (2 wheels) Transfers: Sit to/from Stand Sit to Stand: Min assist           General transfer comment: Min assist with walker to rise from bed. Able to take steps to head of the bed. Limited by low BPs.      Balance Overall balance assessment: Mild deficits observed, not formally tested                                         ADL either performed or assessed with clinical judgement   ADL Overall ADL's :  Needs assistance/impaired Eating/Feeding: Set up   Grooming: Set up   Upper Body Bathing: Set up   Lower Body Bathing: Minimal assistance;Sit to/from stand   Upper Body Dressing : Set up   Lower Body Dressing: Sit to/from stand;Maximal assistance   Toilet Transfer: Minimal assistance;BSC/3in1;Rolling walker (2 wheels)   Toileting- Clothing Manipulation and Hygiene: Moderate assistance;Sit to/from stand       Functional  mobility during ADLs: Minimal assistance;Rolling walker (2 wheels)       Vision Patient Visual Report: No change from baseline       Perception     Praxis      Pertinent Vitals/Pain Pain Assessment: No/denies pain     Hand Dominance Right   Extremity/Trunk Assessment Upper Extremity Assessment Upper Extremity Assessment: Overall WFL for tasks assessed   Lower Extremity Assessment Lower Extremity Assessment: Defer to PT evaluation   Cervical / Trunk Assessment Cervical / Trunk Assessment: Normal   Communication Communication Communication: HOH   Cognition Arousal/Alertness: Awake/alert Behavior During Therapy: WFL for tasks assessed/performed Overall Cognitive Status: Within Functional Limits for tasks assessed                                       General Comments       Exercises     Shoulder Instructions      Home Living Family/patient expects to be discharged to:: Private residence Living Arrangements: Children Available Help at Discharge: Family Type of Home: House Home Access: Stairs to enter Technical brewer of Steps: 3 Entrance Stairs-Rails: Right       Bathroom Shower/Tub: Teacher, early years/pre: Standard     Home Equipment: Merchant navy officer (4 wheels);Adaptive equipment;Tub bench;Rolling Walker (2 wheels) Adaptive Equipment: Reacher;Sock aid Additional Comments: just left [facility] in [place], [place]      Prior Functioning/Environment Prior Level of Function : Independent/Modified Independent             Mobility Comments: uses a rollator ADLs Comments: independent        OT Problem List: Decreased activity tolerance;Impaired balance (sitting and/or standing);Decreased safety awareness;Decreased knowledge of use of DME or AE;Cardiopulmonary status limiting activity      OT Treatment/Interventions: Self-care/ADL training;DME and/or AE instruction;Therapeutic activities;Balance  training;Patient/family education    OT Goals(Current goals can be found in the care plan section) Acute Rehab OT Goals Patient Stated Goal: to get to chair OT Goal Formulation: With patient Time For Goal Achievement: 09/28/21 Potential to Achieve Goals: Good  OT Frequency: Min 2X/week   Barriers to D/C:            Co-evaluation PT/OT/SLP Co-Evaluation/Treatment: Yes Reason for Co-Treatment: For patient/therapist safety          AM-PAC OT "6 Clicks" Daily Activity     Outcome Measure Help from another person eating meals?: A Little Help from another person taking care of personal grooming?: A Little Help from another person toileting, which includes using toliet, bedpan, or urinal?: A Lot Help from another person bathing (including washing, rinsing, drying)?: A Lot Help from another person to put on and taking off regular upper body clothing?: A Little Help from another person to put on and taking off regular lower body clothing?: A Lot 6 Click Score: 15   End of Session Equipment Utilized During Treatment: Rolling walker (2 wheels) Nurse Communication: Mobility status (BPs)  Activity Tolerance: Patient tolerated treatment  well Patient left: in bed;with call bell/phone within reach  OT Visit Diagnosis: Unsteadiness on feet (R26.81);Muscle weakness (generalized) (M62.81)                Time: 8466-5993 OT Time Calculation (min): 34 min Charges:  OT General Charges $OT Visit: 1 Visit OT Evaluation $OT Eval Low Complexity: 1 Low  Kordel Leavy, OTR/L [place]  Office [phone] Pager: [place] 09/14/2021, 3:50 PM

## 2021-09-15 DIAGNOSIS — Z7189 Other specified counseling: Secondary | ICD-10-CM

## 2021-09-15 DIAGNOSIS — I10 Essential (primary) hypertension: Secondary | ICD-10-CM

## 2021-09-15 DIAGNOSIS — G931 Anoxic brain damage, not elsewhere classified: Secondary | ICD-10-CM

## 2021-09-15 LAB — COMPREHENSIVE METABOLIC PANEL
ALT: 41 U/L (ref 0–44)
AST: 64 U/L — ABNORMAL HIGH (ref 15–41)
Albumin: <1.5 g/dL — ABNORMAL LOW (ref 3.5–5.0)
Alkaline Phosphatase: 117 U/L (ref 38–126)
Anion gap: 17 — ABNORMAL HIGH (ref 5–15)
BUN: 11 mg/dL (ref 6–20)
CO2: 17 mmol/L — ABNORMAL LOW (ref 22–32)
Calcium: 7.6 mg/dL — ABNORMAL LOW (ref 8.9–10.3)
Chloride: 104 mmol/L (ref 98–111)
Creatinine, Ser: 1.36 mg/dL — ABNORMAL HIGH (ref 0.61–1.24)
GFR, Estimated: >60 mL/min (ref >60)
Glucose, Bld: 278 mg/dL — ABNORMAL HIGH (ref 70–99)
Potassium: 3.8 mmol/L (ref 3.5–5.1)
Sodium: 138 mmol/L (ref 135–145)
Total Bilirubin: 1.1 mg/dL (ref 0.3–1.2)
Total Protein: 5.6 g/dL — ABNORMAL LOW (ref 6.5–8.1)

## 2021-09-15 LAB — GLUCOSE, CAPILLARY
Glucose-Capillary: 261 mg/dL — ABNORMAL HIGH (ref 70–99)
Glucose-Capillary: 262 mg/dL — ABNORMAL HIGH (ref 70–99)
Glucose-Capillary: 376 mg/dL — ABNORMAL HIGH (ref 70–99)
Glucose-Capillary: 436 mg/dL — ABNORMAL HIGH (ref 70–99)
Glucose-Capillary: 177 mg/dL — ABNORMAL HIGH (ref 70–99)

## 2021-09-15 LAB — CBC
HCT: 30.6 % — ABNORMAL LOW (ref 39.0–52.0)
Hemoglobin: 9.8 g/dL — ABNORMAL LOW (ref 13.0–17.0)
MCH: 28.2 pg (ref 26.0–34.0)
MCHC: 32 g/dL (ref 30.0–36.0)
MCV: 87.9 fL (ref 80.0–100.0)
Platelets: 425 10*3/uL — ABNORMAL HIGH (ref 150–400)
RBC: 3.48 MIL/uL — ABNORMAL LOW (ref 4.22–5.81)
RDW: 15.9 % — ABNORMAL HIGH (ref 11.5–15.5)
WBC: 17.3 10*3/uL — ABNORMAL HIGH (ref 4.0–10.5)
nRBC: 0 % (ref 0.0–0.2)

## 2021-09-15 MED ORDER — INSULIN ASPART 100 UNIT/ML IJ SOLN
0.0000 [IU] | Freq: Three times a day (TID) | INTRAMUSCULAR | Status: DC
Start: 2021-09-15 — End: 2021-09-17
  Administered 2021-09-16: 5 [IU] via SUBCUTANEOUS
  Administered 2021-09-16 – 2021-09-17 (×2): 8 [IU] via SUBCUTANEOUS
  Administered 2021-09-17: 3 [IU] via SUBCUTANEOUS

## 2021-09-15 MED ORDER — PANTOPRAZOLE SODIUM 40 MG IV SOLR
40.0000 mg | Freq: Two times a day (BID) | INTRAVENOUS | Status: DC
  Administered 2021-09-15 – 2021-09-17 (×5): 40 mg via INTRAVENOUS
  Filled 2021-09-15 (×5): qty 40

## 2021-09-15 MED ORDER — VANCOMYCIN HCL 1500 MG/300ML IV SOLN
1500.0000 mg | INTRAVENOUS | Status: DC
  Administered 2021-09-16: 1500 mg via INTRAVENOUS
  Filled 2021-09-15: qty 300

## 2021-09-15 MED ORDER — INSULIN ASPART 100 UNIT/ML IJ SOLN
20.0000 [IU] | Freq: Once | INTRAMUSCULAR | Status: AC
  Administered 2021-09-15: 20 [IU] via SUBCUTANEOUS

## 2021-09-15 MED ORDER — INSULIN ASPART 100 UNIT/ML IJ SOLN: 20.0000 [IU] | Freq: Once | INTRAMUSCULAR | Status: DC

## 2021-09-15 NOTE — Consult Note (Signed)
Palliative Medicine Inpatient Consult Note  Reason for consult:  GOC  HPI: Jeffrey [name] is a 55 year old male with significant past medical history of PEA arrest with anoxic brain injury, seizure disorder, Type II DM, Hypertension, chronic diastolic CHF,  alcohol use disorder, GI bleed, GERD,  and recent hospitalization (10/23-11/01/2021) for syncope secondary to C.Difficile, diarrhea, dehydration, severe electrolyte disturbance who was  discharged from SNF 2 days prior to this admission. Admitted following a fall with hypoglycemia and hypothermia. He tells me he did not eat but took his insulin. He has had coffee ground emesis.  Clinical Assessment/Goals of Care: I have reviewed medical records including EPIC notes, labs and imaging, received report from bedside RN Jeffrey [name], and assessed the patient.    I met with Jeffrey [name] to further discuss diagnosis prognosis, GOC, EOL wishes, disposition and options.   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 He last worked Jul 11, 2020. His wife dies in 08-Jan-2021 of cancer. He has 3 children that live with him: Jeffrey [name], Jeffrey [name], and Jeffrey [name]. They do most of the cooking. He usually gets around with the use of a cane or walker. He is of [place] faith and attend the [employer].   I introduced Palliative Medicine as specialized medical care for people living with serious illness. It focuses on providing relief from the symptoms and stress of a serious illness. The goal is to improve quality of life for both the patient and the family.  A detailed discussion was had today regarding advanced directives.  Concepts specific to code status, artifical feeding and hydration, continued IV antibiotics and rehospitalization was had.  The difference between a aggressive medical intervention path  and a palliative comfort care path for this patient at this time was had. Values and goals of care important  to patient and family were attempted to be elicited. Jeffrey [name] had previous resuscitation success and wishes to remain a full code. MOST form completed with restriction of feeding tube for a defined period of time. He expressed need for assistance with filing for social security disability benefits. SW to provide information on how to apply at social security office.  Discussed the importance of continued conversation with family and their  medical providers regarding overall plan of care and treatment options, ensuring decisions are within the context of the patients values and GOCs.  Decision Maker: Patient, he states his best friend Jeffrey [name] will make decisions when he cannot. His daughter Jeffrey [name] is the second designee.  Advanced Directive paperwork is filled out and in room window seal for notary Monday.  SUMMARY OF RECOMMENDATIONS   Code Status/Advance Care Planning: FULL CODE  MOST form completed. Full scope of treatment if he has a pulse or is breathing, antibiotics if indicated, IC fluids if indicated, Feeding tube for defined period.    Symptom Management:  Pain- acetaminophen prn Breathlessness and wheezing- oxygen as needed, albuterol prn   Palliative Prophylaxis:  Withdrawal symptoms- lorazepam prn Nausea- ondansetron prn GERD- pantoprazole daily Constipation: polyethylene glycol prn  Additional Recommendations (Limitations, Scope, Preferences):  Per MOST feeding tube for a defined trial period if indicated in the future. Diabetes education in preparation for discharge home and self- management of diabetes.  Psycho-social/Spiritual:  Desire for further Chaplaincy support: Yes, consult placed  Discharge Planning: Home with family for support.    Vitals with BMI 09/15/2021 09/15/2021 09/15/2021  Height - - -  Weight - - -  BMI - - -  Systolic [phone]  Diastolic 84 76 70  Pulse 301 - 108    PPS: 50%   This conversation/these recommendations  were discussed with patient.  Thank you for the opportunity to participate in the care of this patient and family.   Total Time: 70 minutes Greater than 50%  of this time was spent counseling and coordinating care related to the above assessment and plan.  Lindell Spar, NP [facility] Palliative Medicine Team Team Cell Phone: [phone] Please utilize secure chat with additional questions, if there is no response within 30 minutes please call the above phone number  Palliative Medicine Team providers are available by phone from 7am to 7pm daily and can be reached through the team cell phone.  Should this patient require assistance outside of these hours, please call the patient's attending physician.

## 2021-09-15 NOTE — Progress Notes (Signed)
PROGRESS NOTE  Jeffrey [name]:[id number] DOB: 02-Sep-1966 DOA: 09/13/2021 PCP: [name], MD  HPI/Recap of past 24 hours: This is a 55 year old male who was admitted from home and was ambulating with a cane and walker, who was also recently discharged from SNF with past medical history of alcohol use disorder, seizure disorder, history of PEA arrest and anoxic brain injury, type 2 diabetes mellitus, hypertension, chronic diastolic CHF, GI bleed, GERD, multiple hospitalization and the most recent was between May 29, 2021 to August 10, 2021 for syncope secondary to C. difficile diarrhea and dehydration.  Patient was brought into the emergency room for evaluation for a fall.  He was found to have blood sugar of 56 he was hypothermic with temperature of 94 F and and had some coffee-ground emesis he was admitted to stepdown. The GI was consulted  Subjective: Patient seen and examined at bedside he stated that he is doing much better. Nurse reported that he had 2 runs of V. tach 1 was 5 beats and the second 1 was 16 beats Also his blood sugar is uncontrolled  Assessment/Plan: Principal Problem:   Hypothermia Active Problems:   HYPERCHOLESTEROLEMIA   Hypoglycemia   Anoxic brain injury ([place])   Cirrhosis ([place])   Essential hypertension   Hypokalemia   Hyperlipidemia associated with type 2 diabetes mellitus ([place])   GI bleed   Recurrent falls  1.  Coffee-ground emesis: GI was consulted patient is noted to have multiple EGDs for coffee-ground emesis and they recommend IV Protonix twice daily for 24 to 48 hours with clear liquids and they do not think he needs to have a repeat endoscopy at this time.  His hemoglobin is stable  2.  Type 2 diabetes mellitus with hyperglycemia uncontrolled.  Diabetic educator has evaluated patient we will start him on sliding scale insulin  3.  Leukocytosis unclear etiology: His urine culture was obtained  urine culture is positive for Enterococcus  faecalis He was started on antibiotic with vancomycin and cefepime Slightly improved today We will continue to monitor  4.  Anemia: Patient is status post blood transfusion hemoglobin today is stable at 9.8  5.  Elevated CK may be related to his fall Patient was not given IV fluid due to his anasarca We will recheck level  6.  Fall at home  PT OT was consulted for rehab Home versus rehab to be determined at time of discharge . 7.  Hypothermia.  Patient had to have Bair hugger for  rewarming Resolved Patient was started on empiric antibiotic with IV cefepime and vancomycin for possible sepsis pending culture Continue to monitor  8.  Anasarca this might be due to liver cirrhosis versus CHF. Continue follow-up with recommendation of the registered dietitian Will monitor  9.  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  Recent history of C. difficile No issues during this admission    Code Status: Full code  Severity of Illness: The appropriate patient status for this patient is INPATIENT. Inpatient status is judged to be reasonable and necessary in order to provide the required intensity of service to ensure the patient's safety. The patient's presenting symptoms, physical exam findings, and initial radiographic and laboratory data in the context of their chronic comorbidities is felt to place them at high risk for further clinical deterioration. Furthermore, it is not anticipated that the patient will be medically stable for discharge from the hospital within 2 midnights of admission.   *  I certify that at the point of admission it is my clinical judgment that the patient will require inpatient hospital care spanning beyond 2 midnights from the point of admission due to high intensity of service, high risk for further deterioration and high frequency of surveillance required.*   Family Communication: None at  bedside  Disposition Plan: To be determined Status is: Inpatient   Dispo: The patient is from: Home              Anticipated d/c is to:               Anticipated d/c date is:               Patient currently not medically stable for discharge  Consultants: GI  Procedures: None  Antimicrobials: Cefepime Vancomycin stopped  DVT prophylaxis: SCD   Objective: Vitals:   09/15/21 0600 09/15/21 0700 09/15/21 0800 09/15/21 0859  BP: 129/70 125/76 134/84   Pulse: (!) 108  (!) 110   Resp: (!) 22 (!) 32 (!) 30   Temp:    97.7 F (36.5 C)  TempSrc:    Oral  SpO2: 97%  98%   Weight:      Height:        Intake/Output Summary (Last 24 hours) at 09/15/2021 0919 Last data filed at 09/15/2021 0843 Gross per 24 hour  Intake 813.7 ml  Output 400 ml  Net 413.7 ml   Filed Weights   09/13/21 2100 09/14/21 0500 09/15/21 0500  Weight: 110.1 kg 113 kg 114.2 kg   Body mass index is 36.12 kg/m.  Exam:  General: 55 y.o. year-old male well developed well nourished in no acute distress.  Alert and oriented x3. Cardiovascular: Regular rate and rhythm with no rubs or gallops.  No thyromegaly or JVD noted.   Respiratory: Clear to auscultation with no wheezes or rales. Good inspiratory effort. Abdomen: Soft nontender nondistended with normal bowel sounds x4 quadrants. Musculoskeletal: No lower extremity edema. 2/4 pulses in all 4 extremities. Skin: No ulcerative lesions noted or rashes, Psychiatry: Mood is appropriate for condition and setting Neurology:    Data Reviewed: CBC: Recent Labs  Lab 09/13/21 1400 09/13/21 2042 09/14/21 0239 09/15/21 0300  WBC 19.1* 17.2* 19.4* 17.3*  NEUTROABS 16.7*  --   --   --   HGB 11.9* 10.9* 10.9* 9.8*  HCT 36.2* 32.7* 33.6* 30.6*  MCV 86.6 86.1 89.4 87.9  PLT 461* 358 366 250*   Basic Metabolic Panel: Recent Labs  Lab 09/13/21 1400 09/13/21 2042 09/14/21 0239 09/14/21 0819 09/15/21 0300  NA 136  --  141  --  138  K 3.2*  --  3.6   --  3.8  CL 101  --  106  --  104  CO2 25  --  23  --  17*  GLUCOSE 110*  --  62*  --  278*  BUN 6  --  6  --  11  CREATININE 0.75  --  0.88  --  1.36*  CALCIUM 7.7*  --  7.2*  --  7.6*  MG  --  0.9*  --  1.8  --    GFR: Estimated Creatinine Clearance: 75.9 mL/min (A) (by C-G formula based on SCr of 1.36 mg/dL (H)). Liver Function Tests: Recent Labs  Lab 09/13/21 1400 09/14/21 0239 09/15/21 0300  AST 110* 78* 64*  ALT 43 35 41  ALKPHOS 141* 105 117  BILITOT 0.3 0.8 1.1  PROT 6.9 5.4* 5.6*  ALBUMIN 1.8* <1.5* <1.5*   No results for input(s): LIPASE, AMYLASE in the last 168 hours. No results for input(s): AMMONIA in the last 168 hours. Coagulation Profile: Recent Labs  Lab 09/13/21 1400  INR 1.4*   Cardiac Enzymes: Recent Labs  Lab 09/13/21 1400 09/14/21 0902  CKTOTAL 2,868* 1,097*   BNP (last 3 results) No results for input(s): PROBNP in the last 8760 hours. HbA1C: No results for input(s): HGBA1C in the last 72 hours. CBG: Recent Labs  Lab 09/14/21 1614 09/14/21 1930 09/14/21 2334 09/15/21 0310 09/15/21 0739  GLUCAP 119* 172* 227* 261* 262*   Lipid Profile: No results for input(s): CHOL, HDL, LDLCALC, TRIG, CHOLHDL, LDLDIRECT in the last 72 hours. Thyroid Function Tests: Recent Labs    09/13/21 1733  TSH 3.730   Anemia Panel: No results for input(s): VITAMINB12, FOLATE, FERRITIN, TIBC, IRON, RETICCTPCT in the last 72 hours. Urine analysis:    Component Value Date/Time   COLORURINE YELLOW 09/13/2021 1602   APPEARANCEUR HAZY (A) 09/13/2021 1602   LABSPEC 1.025 09/13/2021 1602   PHURINE 5.5 09/13/2021 1602   GLUCOSEU NEGATIVE 09/13/2021 1602   HGBUR LARGE (A) 09/13/2021 1602   BILIRUBINUR NEGATIVE 09/13/2021 1602   BILIRUBINUR NEG 11/23/2015 0942   KETONESUR TRACE (A) 09/13/2021 1602   PROTEINUR >300 (A) 09/13/2021 1602   UROBILINOGEN 0.2 11/23/2015 0942   UROBILINOGEN 4.0 (H) 03/09/2015 0816   NITRITE NEGATIVE 09/13/2021 1602   LEUKOCYTESUR  NEGATIVE 09/13/2021 1602   Sepsis Labs: @LABRCNTIP (procalcitonin:4,lacticidven:4)  ) Recent Results (from the past 240 hour(s))  Resp Panel by RT-PCR (Flu A&B, Covid) Nasopharyngeal Swab     Status: None   Collection Time: 09/13/21  2:00 PM   Specimen: Nasopharyngeal Swab; Nasopharyngeal(NP) swabs in vial transport medium  Result Value Ref Range Status   SARS Coronavirus 2 by RT PCR NEGATIVE NEGATIVE Final    Comment: (NOTE) SARS-CoV-2 target nucleic acids are NOT DETECTED.  The SARS-CoV-2 RNA is generally detectable in upper respiratory specimens during the acute phase of infection. The lowest concentration of SARS-CoV-2 viral copies this assay can detect is 138 copies/mL. A negative result does not preclude SARS-Cov-2 infection and should not be used as the sole basis for treatment or other patient management decisions. A negative result may occur with  improper specimen collection/handling, submission of specimen other than nasopharyngeal swab, presence of viral mutation(s) within the areas targeted by this assay, and inadequate number of viral copies(<138 copies/mL). A negative result must be combined with clinical observations, patient history, and epidemiological information. The expected result is Negative.  Fact Sheet for Patients:  EntrepreneurPulse.com.au  Fact Sheet for Healthcare Providers:  IncredibleEmployment.be  This test is no t yet approved or cleared by the [place] FDA and  has been authorized for detection and/or diagnosis of SARS-CoV-2 by FDA under an Emergency Use Authorization (EUA). This EUA will remain  in effect (meaning this test can be used) for the duration of the COVID-19 declaration under Section 564(b)(1) of the Act, 21 U.S.C.section 360bbb-3(b)(1), unless the authorization is terminated  or revoked sooner.       Influenza A by PCR NEGATIVE NEGATIVE Final   Influenza B by PCR NEGATIVE NEGATIVE Final     Comment: (NOTE) The Xpert Xpress SARS-CoV-2/FLU/RSV plus assay is intended as an aid in the diagnosis of influenza from Nasopharyngeal swab specimens and should not be used as a sole basis for treatment. Nasal washings and aspirates are unacceptable for Xpert Xpress SARS-CoV-2/FLU/RSV testing.  Fact Sheet for  Patients: EntrepreneurPulse.com.au  Fact Sheet for Healthcare Providers: IncredibleEmployment.be  This test is not yet approved or cleared by the [place] FDA and has been authorized for detection and/or diagnosis of SARS-CoV-2 by FDA under an Emergency Use Authorization (EUA). This EUA will remain in effect (meaning this test can be used) for the duration of the COVID-19 declaration under Section 564(b)(1) of the Act, 21 U.S.C. section 360bbb-3(b)(1), unless the authorization is terminated or revoked.  Performed at [facility], [place] [address] [place], [place] [address]   Blood Culture (routine x 2)     Status: None (Preliminary result)   Collection Time: 09/13/21  2:05 PM   Specimen: BLOOD  Result Value Ref Range Status   Specimen Description   Final    BLOOD RIGHT ANTECUBITAL Performed at [place] [address] [place], [place] [address]    Special Requests   Final    BOTTLES DRAWN AEROBIC AND ANAEROBIC Blood Culture adequate volume Performed at [place] [address], [place], [place] [address]    Culture   Final    NO GROWTH 2 DAYS Performed at [place] [address], [place], [place] [address]    Report Status PENDING  Incomplete  Urine Culture     Status: None (Preliminary result)   Collection Time: 09/13/21  4:02 PM   Specimen: Urine, Clean Catch  Result Value Ref Range Status   Specimen Description   Final    URINE, CLEAN CATCH Performed at [facility] [place], [place] [address], [place], [place] [address]    Special  Requests   Final    NONE Performed at [facility], [place] [address], [place], [place] [address]    Culture   Final    CULTURE REINCUBATED FOR BETTER GROWTH Performed at [facility], [place] [address] [place], [place] [address]    Report Status PENDING  Incomplete  MRSA Next Gen by PCR, Nasal     Status: None   Collection Time: 09/13/21  7:52 PM   Specimen: Nasal Mucosa; Nasal Swab  Result Value Ref Range Status   MRSA by PCR Next Gen NOT DETECTED NOT DETECTED Final    Comment: (NOTE) The GeneXpert MRSA Assay (FDA approved for NASAL specimens only), is one component of a comprehensive MRSA colonization surveillance program. It is not intended to diagnose MRSA infection nor to guide or monitor treatment for MRSA infections. Test performance is not FDA approved in patients less than 31 years old. Performed at [facility], [place] [address], [place], [place] [address]   Blood Culture (routine x 2)     Status: None (Preliminary result)   Collection Time: 09/13/21  8:42 PM   Specimen: BLOOD  Result Value Ref Range Status   Specimen Description   Final    BLOOD BLOOD LEFT HAND Performed at [place] [address] [place], [place] [address]    Special Requests   Final    BOTTLES DRAWN AEROBIC ONLY Blood Culture adequate volume Performed at [place] [address], [place], [place] [address]    Culture   Final    NO GROWTH 2 DAYS Performed at [place] [address], [place], [place] [address]    Report Status PENDING  Incomplete      Studies: No results found.  Scheduled Meds:  (feeding supplement) PROSource Plus  30 mL Oral Daily  Chlorhexidine Gluconate Cloth  6 each Topical Daily   feeding supplement  237 mL Oral BID BM   folic acid  1 mg Oral Daily   levETIRAcetam  1,000 mg Oral BID   multivitamin with minerals  1 tablet Oral Daily   sodium chloride flush  3 mL  Intravenous Q12H   thiamine  100 mg Oral Daily   Or   thiamine  100 mg Intravenous Daily    Continuous Infusions:  ceFEPime (MAXIPIME) IV Stopped (09/15/21 0600)   pantoprazole 8 mg/hr (09/14/21 0817)   vancomycin Stopped (09/15/21 0739)     LOS: 2 days     Cristal Deer, MD [facility]  To reach me or the doctor on call, go to: [email or web address] Password [facility]  09/15/2021, 9:19 AM

## 2021-09-15 NOTE — Progress Notes (Signed)
[facility] Gastroenterology Progress Note  ELYE HARMSEN 55 y.o. [birth year]   Subjective: Lying in bed without complaints. Denies abdominal pain/N. Denies vomiting overnight.  Objective: Vital signs: Vitals:   09/15/21 0800 09/15/21 0859  BP: 134/84   Pulse: (!) 110   Resp: (!) 30   Temp:  97.7 F (36.5 C)  SpO2: 98%     Physical Exam: Gen: lethargic, chronically ill-appearing, no acute distress  HEENT: anicteric sclera CV: RRR Chest: CTA B Abd: soft, nontender, nondistended, +BS Ext: chronic stasis appearance, nontender  Lab Results: Recent Labs    09/13/21 2042 09/14/21 0239 09/14/21 0819 09/15/21 0300  NA  --  141  --  138  K  --  3.6  --  3.8  CL  --  106  --  104  CO2  --  23  --  17*  GLUCOSE  --  62*  --  278*  BUN  --  6  --  11  CREATININE  --  0.88  --  1.36*  CALCIUM  --  7.2*  --  7.6*  MG 0.9*  --  1.8  --    Recent Labs    09/14/21 0239 09/15/21 0300  AST 78* 64*  ALT 35 41  ALKPHOS 105 117  BILITOT 0.8 1.1  PROT 5.4* 5.6*  ALBUMIN <1.5* <1.5*   Recent Labs    09/13/21 1400 09/13/21 2042 09/14/21 0239 09/15/21 0300  WBC 19.1*   < > 19.4* 17.3*  NEUTROABS 16.7*  --   --   --   HGB 11.9*   < > 10.9* 9.8*  HCT 36.2*   < > 33.6* 30.6*  MCV 86.6   < > 89.4 87.9  PLT 461*   < > 366 425*   < > = values in this interval not displayed.      Assessment/Plan: Coffee grounds emesis resolved likely due to erosive esophagitis. No plans for repeat EGD at this time. Change to Protonix 40 mg IV BID. On soft diet. Will sign off. Call if questions. F/U with GI prn.   Lear Ng 09/15/2021, 10:13 AM  Questions please call [phone] Patient ID: ALDYN TOON, male   DOB: May 05, 1966, 55 y.o.   MRN: [id number]

## 2021-09-15 NOTE — Plan of Care (Signed)
  Problem: Nutrition: Goal: Adequate nutrition will be maintained Outcome: Progressing   

## 2021-09-15 NOTE — Progress Notes (Signed)
Inpatient Diabetes Program Recommendations  AACE/ADA: New Consensus Statement on Inpatient Glycemic Control (2015)  Target Ranges:  Prepandial:   less than 140 mg/dL      Peak postprandial:   less than 180 mg/dL (1-2 hours)      Critically ill patients:  140 - 180 mg/dL   Lab Results  Component Value Date   GLUCAP 376 (H) 09/15/2021   HGBA1C 5.7 (H) 07/31/2021    Review of Glycemic Control  Diabetes history: type 2 Outpatient Diabetes medications: Lantus 5 units daily, Metformin XR 500 mg BID (not taking meds?) Current orders for Inpatient glycemic control: none  Inpatient Diabetes Program Recommendations:   Noted that patient was admitted with hypoglycemia and hypothermia. Med list states that patient was not taking diabetes medications, but apparently took insulin prior to admission. Care management needs to see what home situation is with patient and decide what is best for the patient for discharge.   Recommend starting Novolog 0-15 units correction scale TID. Not sure how patient responds to insulin. Titrate dosages as needed. May need basal insulin if blood sugars continue to be elevated.  Will continue to monitor blood sugars while in the hospital.  Harvel Ricks RN BSN CDE Diabetes Coordinator Pager: [phone]  8am-5pm

## 2021-09-15 NOTE — Progress Notes (Signed)
Pharmacy Antibiotic Note  Jeffrey [name] is a 55 y.o. male presented to the ED  on 09/13/2021 for evaluation of hypoglycemia and s/p fall.  He was started to cefepime and vancomycin on admission for suspected sepsis.  - 12/9 abd xray: neg - 12/9 CXR: neg   12/8 Flagyl x 1 12/8 cefepime >>  12/8 vancomycin >>    12/8 BCx x2:  12/8 mrsa pcr: neg 12/8 ucx: >100K enterococcus faecalis (suscept pending)  Today, 09/15/2021: - day #3 abx - afeb, wbc 17.3 - scr up 1.36 (crcl~76)  Plan: - continue cefepime 2gm q8h - adjust vancomycin dose to 1500 mg q24g for est AUC 472 - monitor renal function closely  ________________________________________  Height: 5\' 10"  (177.8 cm) Weight: 114.2 kg (251 lb 12.3 oz) IBW/kg (Calculated) : 73  Temp (24hrs), Avg:97.6 F (36.4 C), Min:97.3 F (36.3 C), Max:97.7 F (36.5 C)  Recent Labs  Lab 09/13/21 1400 09/13/21 1733 09/13/21 2042 09/14/21 0239 09/15/21 0300  WBC 19.1*  --  17.2* 19.4* 17.3*  CREATININE 0.75  --   --  0.88 1.36*  LATICACIDVEN 2.1* 1.8  --   --   --     Estimated Creatinine Clearance: 75.9 mL/min (A) (by C-G formula based on SCr of 1.36 mg/dL (H)).    Allergies  Allergen Reactions   Lisinopril Other (See Comments)    Pt reports nose bleed.   Atorvastatin     Cause Muscle Weakness   Drug Ingredient [Spironolactone]     Hyperkalemia     Thank you for allowing pharmacy to be a part of this patient's care.  Lynelle Doctor 09/15/2021 12:17 PM

## 2021-09-16 DIAGNOSIS — R569 Unspecified convulsions: Secondary | ICD-10-CM

## 2021-09-16 DIAGNOSIS — N390 Urinary tract infection, site not specified: Secondary | ICD-10-CM

## 2021-09-16 DIAGNOSIS — K2210 Ulcer of esophagus without bleeding: Secondary | ICD-10-CM

## 2021-09-16 DIAGNOSIS — E11649 Type 2 diabetes mellitus with hypoglycemia without coma: Secondary | ICD-10-CM

## 2021-09-16 DIAGNOSIS — K920 Hematemesis: Secondary | ICD-10-CM

## 2021-09-16 DIAGNOSIS — I248 Other forms of acute ischemic heart disease: Secondary | ICD-10-CM

## 2021-09-16 DIAGNOSIS — A419 Sepsis, unspecified organism: Principal | ICD-10-CM

## 2021-09-16 LAB — CREATININE, SERUM
Creatinine, Ser: 1.12 mg/dL (ref 0.61–1.24)
GFR, Estimated: >60 mL/min (ref >60)

## 2021-09-16 LAB — GLUCOSE, CAPILLARY
Glucose-Capillary: 136 mg/dL — ABNORMAL HIGH (ref 70–99)
Glucose-Capillary: 202 mg/dL — ABNORMAL HIGH (ref 70–99)
Glucose-Capillary: 229 mg/dL — ABNORMAL HIGH (ref 70–99)
Glucose-Capillary: 264 mg/dL — ABNORMAL HIGH (ref 70–99)
Glucose-Capillary: 80 mg/dL (ref 70–99)
Glucose-Capillary: 80 mg/dL (ref 70–99)

## 2021-09-16 LAB — URINE CULTURE: Culture: >=100000 — AB

## 2021-09-16 LAB — CK: Total CK: 310 U/L (ref 49–397)

## 2021-09-16 MED ORDER — AMOXICILLIN 250 MG PO CAPS
500.0000 mg | ORAL_CAPSULE | Freq: Three times a day (TID) | ORAL | Status: DC
  Administered 2021-09-16 – 2021-09-17 (×4): 500 mg via ORAL
  Filled 2021-09-16 (×4): qty 2

## 2021-09-16 NOTE — Assessment & Plan Note (Signed)
--  stable, continue Keppra.

## 2021-09-16 NOTE — Assessment & Plan Note (Signed)
--   Resolved.  Secondary to erosive esophagitis clinically as above.  Continue PPI.

## 2021-09-16 NOTE — Progress Notes (Signed)
Inpatient Diabetes Program Recommendations  AACE/ADA: New Consensus Statement on Inpatient Glycemic Control (2015)  Target Ranges:  Prepandial:   less than 140 mg/dL      Peak postprandial:   less than 180 mg/dL (1-2 hours)      Critically ill patients:  140 - 180 mg/dL   Lab Results  Component Value Date   GLUCAP 202 (H) 09/16/2021   HGBA1C 5.7 (H) 07/31/2021    Review of Glycemic Control  Diabetes history: type 2 Outpatient Diabetes medications: Lantus 5 units daily, Metformin XR 500 mg BID, not taking Farxiga(per patient) Current orders for Inpatient glycemic control: Novolog MODERATE correction scale TID   Inpatient Diabetes Program Recommendations:    Spoke with patient on the phone. Patient states that he was diagnosed with diabetes in 2001, started on insulin in 2005. He gives himself insulin and manages all his medications himself. Lives with his 3 children whom he says are there all the time. States that he was in SNF until 2 days before this admission, after being in the hospital for 2 weeks prior to going to SNF. He had to order all his medications again when he got home from SNF. He says that he took the wrong dose of insulin and did not eat at that time. He thinks that is why is blood sugar was low. His appetite was not good when he got home from SNF. He also has a Dexcom that he says was not working properly after he got home from SNF. He does have a home blood glucose meter, but no strips for it. HgbA1C is 5.7% which is low for his age. This may indicate that he has been having low blood sugars over the last 2-3 months.   He has an appointment on December 27,2022 with [facility]. His needs for insulin have definitely changed. He states that he does not take the Metformin because it makes him feel bad. He also states that he does not take [place].   His medication needs for his diabetes need to be safe. He needs to follow up with his PCP to see what medications he  should be on. Family may need to check his medications that he takes at home to make sure he is taking the right dosages.  Will continue to monitor blood sugars while in the hospital.  Harvel Ricks RN BSN CDE Diabetes Coordinator Pager: [phone]  8am-5pm

## 2021-09-16 NOTE — Assessment & Plan Note (Signed)
--   Appears stable at this point, follow-up with primary care as an outpatient.

## 2021-09-16 NOTE — Assessment & Plan Note (Addendum)
--   Resolved.  Continue oral antibiotics on discharge to complete course.

## 2021-09-16 NOTE — Hospital Course (Addendum)
55 year old man lives with his children, complicated past medical history, presented after a fall at home, found on the floor with CBG 56, hypothermic in the emergency department with an episode of coffee-ground emesis.  Was seen by gastroenterology, noted to have a history of esophagitis gastritis, ulcers and esophageal varices.  Gastroenterology recommended continuing PPI, no endoscopy.  Hypoglycemia resolved with appropriate therapy, patient reports that he took too much insulin at home which would explain this event.  Especially in light of sepsis.  Blood sugars have been stable, sepsis resolved with appropriate treatment and de-escalated to oral antibiotics.  Now stable for discharge.

## 2021-09-16 NOTE — Assessment & Plan Note (Signed)
--  stable, follow-up as an outpatient.

## 2021-09-16 NOTE — Assessment & Plan Note (Signed)
--   Secondary to sepsis, acute illness, resolved.  No further evaluation suggested.

## 2021-09-16 NOTE — Progress Notes (Signed)
  Progress Note Jeffrey [name]   [id number]  DOB: [birth year]  DOA: 09/13/2021     3 Date of Service: 09/16/2021   Clinical Course 55 year old man lives with his children, complicated past medical history, presented after a fall at home, found on the floor with CBG 56, hypothermic in the emergency department with an episode of coffee-ground emesis.  Was seen by gastroenterology, noted to have a history of esophagitis gastritis, ulcers and esophageal varices.  Gastroenterology recommended continuing PPI, no endoscopy.  Hypoglycemia resolved with appropriate therapy, patient reports that he took too much insulin at home which would explain this event.  Especially in light of sepsis.  Blood sugars have been stable, sepsis resolved with appropriate treatment and de-escalated to oral antibiotics.  Overall improving, likely home in the next few days.  Assessment and Plan * Sepsis secondary to UTI (Jeffrey [name]) -- Appears resolved at this time, euthermic.  De-escalate to oral antibiotics  Type 2 diabetes mellitus with hypoglycemia without coma (Jeffrey [name]) -- Secondary to sepsis and reported accidental overdose of insulin.  Hypoglycemia resolved.  Plan discharge home on much lower dose of insulin and close outpatient follow-up.  He takes metformin but no longer takes [place].  Hematemesis -- Resolved.  Secondary to erosive esophagitis clinically as above.  Continue PPI.  Erosive esophagitis -- Clinical diagnosis based on history, no further hematemesis reported, hemoglobin stable.  Continue PPI therapy.  Demand ischemia (Jeffrey [name]) -- Secondary to sepsis, acute illness, resolved.  No further evaluation suggested.  Cirrhosis (Jeffrey [name]) -- Appears stable at this point, follow-up with primary care as an outpatient.  Seizure (Jeffrey [name]) --stable, continue Keppra.  Anemia of chronic disease --stable, follow-up as an outpatient.  Subjective:  Feels ok, no new complaints.  Objective Vitals:   09/16/21 0017 09/16/21 0429  09/16/21 1026 09/16/21 1332  BP: 140/76 130/65 (!) 145/94 (!) 148/97  Pulse: 91 87 92 97  Resp: 19 18 16 17   Temp: 98.2 F (36.8 C) 98.1 F (36.7 C) 98 F (36.7 C) 98 F (36.7 C)  TempSrc: Oral Oral    SpO2: 100% 100% 99% 100%  Weight:      Height:       114.2 kg  Vital signs were reviewed and unremarkable.  Exam Physical Exam Vitals reviewed.  Constitutional:      General: He is not in acute distress.    Appearance: He is not ill-appearing.  Cardiovascular:     Rate and Rhythm: Normal rate and regular rhythm.     Heart sounds: No murmur heard. Pulmonary:     Effort: Pulmonary effort is normal. No respiratory distress.     Breath sounds: No wheezing, rhonchi or rales.  Neurological:     Mental Status: He is oriented to person, place, and time.  Psychiatric:        Mood and Affect: Mood normal.        Behavior: Behavior normal.    Labs / Other Information My review of labs, imaging, notes and other tests is significant for     Creatinine 1.12  Disposition Plan: Status is: Inpatient  Remains inpatient appropriate because: Treatment for sepsis, monitoring of blood sugars.  SCDs  Time spent: 35 minutes [facility] 09/16/2021, 7:27 PM

## 2021-09-16 NOTE — Assessment & Plan Note (Signed)
--   Clinical diagnosis based on history, no further hematemesis reported, hemoglobin stable.  Continue PPI therapy.

## 2021-09-16 NOTE — Assessment & Plan Note (Addendum)
--   Secondary to sepsis and reported accidental overdose of insulin.  Hypoglycemia resolved.  Plan discharge home on much lower dose of insulin (5 units) and close outpatient follow-up.  He takes metformin but no longer takes [place].

## 2021-09-17 ENCOUNTER — Other Ambulatory Visit: Payer: Self-pay | Admitting: Family Medicine

## 2021-09-17 DIAGNOSIS — Z794 Long term (current) use of insulin: Secondary | ICD-10-CM

## 2021-09-17 LAB — BASIC METABOLIC PANEL
Anion gap: 3 — ABNORMAL LOW (ref 5–15)
BUN: 16 mg/dL (ref 6–20)
CO2: 24 mmol/L (ref 22–32)
Calcium: 7.4 mg/dL — ABNORMAL LOW (ref 8.9–10.3)
Chloride: 107 mmol/L (ref 98–111)
Creatinine, Ser: 0.82 mg/dL (ref 0.61–1.24)
GFR, Estimated: >60 mL/min (ref >60)
Glucose, Bld: 191 mg/dL — ABNORMAL HIGH (ref 70–99)
Potassium: 4.6 mmol/L (ref 3.5–5.1)
Sodium: 134 mmol/L — ABNORMAL LOW (ref 135–145)

## 2021-09-17 LAB — GLUCOSE, CAPILLARY
Glucose-Capillary: 198 mg/dL — ABNORMAL HIGH (ref 70–99)
Glucose-Capillary: 265 mg/dL — ABNORMAL HIGH (ref 70–99)

## 2021-09-17 MED ORDER — BLOOD GLUCOSE METER KIT: PACK | 0 refills | Status: DC

## 2021-09-17 MED ORDER — AMOXICILLIN 500 MG PO CAPS: 500.0000 mg | ORAL_CAPSULE | Freq: Three times a day (TID) | ORAL | 0 refills | Status: DC

## 2021-09-17 MED ORDER — GABAPENTIN 300 MG PO CAPS: 300.0000 mg | ORAL_CAPSULE | Freq: Three times a day (TID) | ORAL | 0 refills | Status: DC

## 2021-09-17 MED ORDER — FREESTYLE LIBRE 2 SENSOR MISC: 0 refills | Status: DC

## 2021-09-17 MED ORDER — METFORMIN HCL ER 500 MG PO TB24
500.0000 mg | ORAL_TABLET | Freq: Two times a day (BID) | ORAL | 0 refills | Status: DC
Start: 2021-09-17 — End: 2021-12-04

## 2021-09-17 MED ORDER — LEVETIRACETAM 1000 MG PO TABS: 1000.0000 mg | ORAL_TABLET | Freq: Two times a day (BID) | ORAL | 0 refills | Status: DC

## 2021-09-17 MED ORDER — INSULIN GLARGINE-YFGN 100 UNIT/ML ~~LOC~~ SOLN
5.0000 [IU] | Freq: Every day | SUBCUTANEOUS | Status: DC
  Administered 2021-09-17: 5 [IU] via SUBCUTANEOUS
  Filled 2021-09-17: qty 0.05

## 2021-09-17 MED ORDER — PANTOPRAZOLE SODIUM 40 MG PO TBEC: 40.0000 mg | DELAYED_RELEASE_TABLET | Freq: Two times a day (BID) | ORAL | 1 refills | Status: DC

## 2021-09-17 NOTE — Discharge Summary (Addendum)
Physician Discharge Summary   Patient name: Jeffrey [name]  Admit date:     09/13/2021  Discharge date: 09/17/2021  Discharge Physician: Murray Hodgkins   PCP: [name], MD   Recommendations at discharge:  Follow-up hypoglycemia, diabetes, insulin decreased to 5 units daily Follow-up resolution of sepsis  Discharge Diagnoses Principal Problem:   Sepsis secondary to UTI [facility]) Active Problems:   Type 2 diabetes mellitus with hypoglycemia without coma (HCC)   Erosive esophagitis   Hematemesis   Cirrhosis (HCC)   Essential hypertension   Hypothermia   Demand ischemia (HCC)   Anemia of chronic disease   Seizure ([place])   Hyperlipidemia associated with type 2 diabetes mellitus [facility])  Hospital Course   55 year old man lives with his children, complicated past medical history, presented after a fall at home, found on the floor with CBG 56, hypothermic in the emergency department with an episode of coffee-ground emesis.  Was seen by gastroenterology, noted to have a history of esophagitis gastritis, ulcers and esophageal varices.  Gastroenterology recommended continuing PPI, no endoscopy.  Hypoglycemia resolved with appropriate therapy, patient reports that he took too much insulin at home which would explain this event.  Especially in light of sepsis.  Blood sugars have been stable, sepsis resolved with appropriate treatment and de-escalated to oral antibiotics.  Now stable for discharge.  * Sepsis secondary to UTI ([place]) -- Resolved.  Continue oral antibiotics on discharge to complete course.  Type 2 diabetes mellitus with hypoglycemia without coma ([place]) -- Secondary to sepsis and reported accidental overdose of insulin.  Hypoglycemia resolved.  Plan discharge home on much lower dose of insulin (5 units) and close outpatient follow-up.  He takes metformin but no longer takes [place].  Hematemesis -- Resolved.  Secondary to erosive esophagitis clinically as above.  Continue  PPI.  Erosive esophagitis -- Clinical diagnosis based on history, no further hematemesis reported, hemoglobin stable.  Continue PPI therapy.  Demand ischemia ([place]) -- Secondary to sepsis, acute illness, resolved.  No further evaluation suggested.  Hypothermia --resolved  Cirrhosis ([place]) -- Appears stable at this point, follow-up with primary care as an outpatient.  Seizure ([place]) --stable, continue Keppra.  Anemia of chronic disease --stable, follow-up as an outpatient.   Procedures performed: none   Condition at discharge: good Feels good Exam Physical Exam Constitutional:      General: He is not in acute distress.    Appearance: He is not ill-appearing.  Cardiovascular:     Rate and Rhythm: Normal rate and regular rhythm.     Heart sounds: No murmur heard. Pulmonary:     Effort: Pulmonary effort is normal. No respiratory distress.     Breath sounds: No wheezing, rhonchi or rales.  Neurological:     Mental Status: He is alert.  Psychiatric:        Mood and Affect: Mood normal.        Behavior: Behavior normal.    Disposition: Home  Discharge time: greater than 30 minutes.  Follow-up Information     [name], MD Follow up on 10/02/2021.   Specialty: Family Medicine Why: Keep already scheduled appointment Contact information: [address] [place] [address] [phone]                 Allergies as of 09/17/2021       Reactions   Lisinopril Other (See Comments)   Pt reports nose bleed.   Atorvastatin    Cause Muscle Weakness   Drug Ingredient [  spironolactone]    Hyperkalemia        Medication List     STOP taking these medications    atorvastatin 80 MG tablet Commonly known as: LIPITOR   dapagliflozin propanediol 10 MG Tabs tablet Commonly known as: FARXIGA   Dexcom G6 Transmitter Misc   famotidine 20 MG tablet Commonly known as: PEPCID   True Metrix Blood Glucose Test test strip Generic drug: glucose blood        TAKE these medications    amoxicillin 500 MG capsule Commonly known as: AMOXIL Take 1 capsule (500 mg total) by mouth every 8 (eight) hours.   aspirin EC 81 MG tablet Take 1 tablet (81 mg total) by mouth at bedtime.   blood glucose meter kit and supplies Dispense based on patient and insurance preference. Use up to four times daily as directed. (FOR ICD-10 E10.9, E11.9).   Ensure Max Protein Liqd Take 330 mLs (11 oz total) by mouth 3 (three) times daily between meals.   folic acid 1 MG tablet Commonly known as: FOLVITE Take 1 tablet (1 mg total) by mouth daily.   FreeStyle Libre 2 [employer] Use as directed What changed:  how much to take how to take this when to take this additional instructions   gabapentin 300 MG capsule Commonly known as: NEURONTIN Take 1 capsule (300 mg total) by mouth 3 (three) times daily.   insulin glargine 100 unit/mL Sopn Commonly known as: LANTUS Inject 5 Units into the skin daily.   levETIRAcetam 1000 MG tablet Commonly known as: KEPPRA Take 1 tablet (1,000 mg total) by mouth 2 (two) times daily.   metFORMIN 500 MG 24 hr tablet Commonly known as: GLUCOPHAGE-XR Take 1 tablet (500 mg total) by mouth 2 (two) times daily with a meal.   multivitamin with minerals Tabs tablet Take 1 tablet by mouth daily.   pantoprazole 40 MG tablet Commonly known as: Protonix Take 1 tablet (40 mg total) by mouth 2 (two) times daily.   thiamine 100 MG tablet Commonly known as: Vitamin B-1 Take 1 tablet (100 mg total) by mouth daily.        CT HEAD WO CONTRAST (5MM)  Result Date: 09/13/2021 CLINICAL DATA:  Syncope/presyncope, cerebrovascular cause suspected EXAM: CT HEAD WITHOUT CONTRAST TECHNIQUE: Contiguous axial images were obtained from the base of the skull through the vertex without intravenous contrast. COMPARISON:  None. BRAIN: BRAIN Cerebral ventricle sizes are concordant with the degree of cerebral volume loss. No evidence of  large-territorial acute infarction. No parenchymal hemorrhage. No mass lesion. No extra-axial collection. No mass effect or midline shift. No hydrocephalus. Basilar cisterns are patent. Vascular: No hyperdense vessel. Skull: No acute fracture or focal lesion. Sinuses/Orbits: Paranasal sinuses and mastoid air cells are clear. Left lens replacement. Otherwise orbits are unremarkable. Other: None. IMPRESSION: No acute intracranial abnormality. Electronically Signed   By: Iven Finn M.D.   On: 09/13/2021 17:54   DG Chest Port 1 View  Result Date: 09/13/2021 CLINICAL DATA:  Possible sepsis. EXAM: PORTABLE CHEST 1 VIEW COMPARISON:  Chest x-ray dated July 29, 2021. FINDINGS: The heart size and mediastinal contours are within normal limits. Both lungs are clear. The visualized skeletal structures are unremarkable. IMPRESSION: No active disease. Electronically Signed   By: Titus Dubin M.D.   On: 09/13/2021 15:48   DG Abd Portable 1V  Result Date: 09/13/2021 CLINICAL DATA:  Nausea and vomiting EXAM: PORTABLE ABDOMEN - 1 VIEW COMPARISON:  CT abdomen pelvis 11/01/2020 FINDINGS: Prominent loop  of small bowel within the right mid abdomen with no definite dilatation. The bowel gas pattern is normal. No radio-opaque calculi or other significant radiographic abnormality are seen. IMPRESSION: Negative. Electronically Signed   By: Iven Finn M.D.   On: 09/13/2021 17:39   Results for orders placed or performed during the hospital encounter of 09/13/21  Resp Panel by RT-PCR (Flu A&B, Covid) Nasopharyngeal Swab     Status: None   Collection Time: 09/13/21  2:00 PM   Specimen: Nasopharyngeal Swab; Nasopharyngeal(NP) swabs in vial transport medium  Result Value Ref Range Status   SARS Coronavirus 2 by RT PCR NEGATIVE NEGATIVE Final    Comment: (NOTE) SARS-CoV-2 target nucleic acids are NOT DETECTED.  The SARS-CoV-2 RNA is generally detectable in upper respiratory specimens during the acute phase of  infection. The lowest concentration of SARS-CoV-2 viral copies this assay can detect is 138 copies/mL. A negative result does not preclude SARS-Cov-2 infection and should not be used as the sole basis for treatment or other patient management decisions. A negative result may occur with  improper specimen collection/handling, submission of specimen other than nasopharyngeal swab, presence of viral mutation(s) within the areas targeted by this assay, and inadequate number of viral copies(<138 copies/mL). A negative result must be combined with clinical observations, patient history, and epidemiological information. The expected result is Negative.  Fact Sheet for Patients:  EntrepreneurPulse.com.au  Fact Sheet for Healthcare Providers:  IncredibleEmployment.be  This test is no t yet approved or cleared by the [place] FDA and  has been authorized for detection and/or diagnosis of SARS-CoV-2 by FDA under an Emergency Use Authorization (EUA). This EUA will remain  in effect (meaning this test can be used) for the duration of the COVID-19 declaration under Section 564(b)(1) of the Act, 21 U.S.C.section 360bbb-3(b)(1), unless the authorization is terminated  or revoked sooner.       Influenza A by PCR NEGATIVE NEGATIVE Final   Influenza B by PCR NEGATIVE NEGATIVE Final    Comment: (NOTE) The Xpert Xpress SARS-CoV-2/FLU/RSV plus assay is intended as an aid in the diagnosis of influenza from Nasopharyngeal swab specimens and should not be used as a sole basis for treatment. Nasal washings and aspirates are unacceptable for Xpert Xpress SARS-CoV-2/FLU/RSV testing.  Fact Sheet for Patients: EntrepreneurPulse.com.au  Fact Sheet for Healthcare Providers: IncredibleEmployment.be  This test is not yet approved or cleared by the [place] FDA and has been authorized for detection and/or diagnosis of SARS-CoV-2  by FDA under an Emergency Use Authorization (EUA). This EUA will remain in effect (meaning this test can be used) for the duration of the COVID-19 declaration under Section 564(b)(1) of the Act, 21 U.S.C. section 360bbb-3(b)(1), unless the authorization is terminated or revoked.  Performed at [facility], [place] [address], [place], [place] [address]   Blood Culture (routine x 2)     Status: None (Preliminary result)   Collection Time: 09/13/21  2:05 PM   Specimen: BLOOD  Result Value Ref Range Status   Specimen Description   Final    BLOOD RIGHT ANTECUBITAL Performed at [facility] [address] [place],  [address]    Special Requests   Final    BOTTLES DRAWN AEROBIC AND ANAEROBIC Blood Culture adequate volume Performed at [facility] [address], [place],  [address]    Culture   Final    NO GROWTH 4 DAYS Performed at [facility], [place] [address]  [place], [place] [address]    Report Status PENDING  Incomplete  Urine Culture     Status: Abnormal   Collection Time: 09/13/21  4:02 PM   Specimen: Urine, Clean Catch  Result Value Ref Range Status   Specimen Description   Final    URINE, CLEAN CATCH Performed at [facility], [place] [address] [place], [place] [address]    Special Requests   Final    NONE Performed at [facility], [place] [address] [place], [place] [address]    Culture >=100,000 COLONIES/mL ENTEROCOCCUS FAECALIS (A)  Final   Report Status 09/16/2021 FINAL  Final   Organism ID, Bacteria ENTEROCOCCUS FAECALIS (A)  Final      Susceptibility   Enterococcus faecalis - MIC*    AMPICILLIN <=2 SENSITIVE Sensitive     NITROFURANTOIN <=16 SENSITIVE Sensitive     VANCOMYCIN 1 SENSITIVE Sensitive     * >=100,000 COLONIES/mL ENTEROCOCCUS FAECALIS  MRSA Next Gen by PCR, Nasal     Status: None   Collection Time: 09/13/21  7:52 PM   Specimen: Nasal  Mucosa; Nasal Swab  Result Value Ref Range Status   MRSA by PCR Next Gen NOT DETECTED NOT DETECTED Final    Comment: (NOTE) The GeneXpert MRSA Assay (FDA approved for NASAL specimens only), is one component of a comprehensive MRSA colonization surveillance program. It is not intended to diagnose MRSA infection nor to guide or monitor treatment for MRSA infections. Test performance is not FDA approved in patients less than 82 years old. Performed at [facility], [place] [address] [place], [place] [address]   Blood Culture (routine x 2)     Status: None (Preliminary result)   Collection Time: 09/13/21  8:42 PM   Specimen: BLOOD  Result Value Ref Range Status   Specimen Description   Final    BLOOD BLOOD LEFT HAND Performed at [facility] [address] [place], [place] [address]    Special Requests   Final    BOTTLES DRAWN AEROBIC ONLY Blood Culture adequate volume Performed at [place] [address], [place], [place] [address]    Culture   Final    NO GROWTH 4 DAYS Performed at [facility], [place] [address] [place], [place] [address]    Report Status PENDING  Incomplete    Signed:  Murray Hodgkins MD.  [facility] 09/17/2021, 12:37 PM

## 2021-09-17 NOTE — Progress Notes (Signed)
[name] to be D/C'd Home with home health per MD order.  Discussed with the patient and all questions fully answered.  VSS, Skin clean, dry and intact without evidence of skin break down, no evidence of skin tears noted. IV catheter discontinued intact. Site without signs and symptoms of complications. Dressing and pressure applied.  An After Visit Summary was printed and given to the patient. Patient prescriptions sent to his pharmacy.  D/c education completed with patient/family including follow up instructions, medication list, d/c activities limitations if indicated, with other d/c instructions as indicated by MD - patient able to verbalize understanding, all questions fully answered.   Patient instructed to return to ED, call 911, or call MD for any changes in condition.   Patient escorted via WC, and D/C home via [place] transportation.  Manuella Ghazi 09/17/2021 2:24 PM

## 2021-09-17 NOTE — Assessment & Plan Note (Signed)
resolved 

## 2021-09-17 NOTE — Progress Notes (Addendum)
Inpatient Diabetes Program Recommendations  AACE/ADA: New Consensus Statement on Inpatient Glycemic Control (2015)  Target Ranges:  Prepandial:   less than 140 mg/dL      Peak postprandial:   less than 180 mg/dL (1-2 hours)      Critically ill patients:  140 - 180 mg/dL   Lab Results  Component Value Date   GLUCAP 198 (H) 09/17/2021   HGBA1C 5.7 (H) 07/31/2021    Review of Glycemic Control  Latest Reference Range & Units 09/16/21 08:00 09/16/21 12:20 09/16/21 16:11 09/16/21 22:18 09/17/21 07:36  Glucose-Capillary 70 - 99 mg/dL 80 202 (H) 264 (H) 229 (H) 198 (H)  (H): Data is abnormally high  Diabetes history: DM2 Outpatient Diabetes medications: Lantus 5 units QD, Farxiga (not taking) & Metformin (not taking) Current orders for Inpatient glycemic control: Novolog 0-15 units TID  Please consider 3 units Novolog TID with meals if eats at least 50% & Novolog 0-5 units QHS  Spoke with patient at bedside.  He states he took Lantus 12 units at home after discharging from SNF.  He used to be on 20 units and he didn't realize he was only supposed to take 5 units.  He has worn a Dexcom in the past but he cannot afford them.  He does not have any strips at home for his glucose meter.  He has a follow up appt with his PCP on 12/28.  He states his PCP does not want him taking rapid insulin at home.    Recommend Lantus 5 units at DC and follow up with PCP.  Would like to place [facility] prior to DC if okay by MD.    Please prescribe Glucometer and supplies at DC [phone]  Addendum@1300 - Placed Freestyle Libre to back of left arm.  Educated patient on how to use.  Alarms set for low-70 mg/dL and high-240mg /dL.  Addition sensor provided to patient.    Will continue to follow while inpatient.  Thank you, Reche Dixon, RN, BSN Diabetes Coordinator Inpatient Diabetes Program [phone] (team pager from 8a-5p)

## 2021-09-17 NOTE — Consult Note (Signed)
[facility] Inpatient Consult   09/17/2021  DELDRICK [name] 17-Dec-1965 [id number]  [facility] CM)   Patient chart reviewed with noted high risk score for unplanned readmission. Assessed for potential care coordination and chronic disease management services post hospital with [facility]. Patient's primary provider office offers embedded [facility] CM services.   Unsuccessful attempt to speak with patient by telephone to explain [facility] CM services.   Plan: Referral for post hospital CCM follow up at primary provider office.   Of note, [facility] Care Management services does not replace or interfere with any services that are arranged by inpatient case management or social work.   Netta Cedars, MSN, RN [facility] [employer] [phone]  Toll free office [phone]

## 2021-09-17 NOTE — TOC Transition Note (Signed)
Transition of Care [facility]) - CM/SW Discharge Note   Patient Details  Name: Jeffrey [name] MRN: [id number] Date of Birth: May 21, 1966  Transition of Care [facility]) CM/SW Contact:  Lynnell Catalan, RN Phone Number: 09/17/2021, 1:01 PM   Clinical Narrative:     Pt was active with [place] for home health services prior to admission. Gay alerted of dc today and that pt has new [place] orders.   Final next level of care: [place] Barriers to Discharge: No Barriers Identified  Discharge Plan and Services           HH Arranged: RN, PT, OT, Social Work [facility] Agency: [place] Date [facility] Agency Contacted: 09/17/21 Time HH Agency Contacted: [phone]    Social Determinants of Health (SDOH) Interventions     Readmission Risk Interventions Readmission Risk Prevention Plan 09/17/2021 08/01/2021  Transportation Screening Complete Complete  PCP or Specialist Appt within 5-7 Days - Complete  PCP or Specialist Appt within 3-5 Days Complete -  Home Care Screening - Patient refused  Medication Review (RN CM) - Complete  HRI or Home Care Consult Complete -  Social Work Consult for [place] Planning/Counseling Complete -  Palliative Care Screening Not Applicable -  Medication Review Press photographer) Complete -  Some recent data might be hidden

## 2021-09-18 ENCOUNTER — Other Ambulatory Visit: Payer: Self-pay | Admitting: Family Medicine

## 2021-09-18 ENCOUNTER — Telehealth: Payer: Self-pay | Admitting: *Deleted

## 2021-09-18 LAB — CULTURE, BLOOD (ROUTINE X 2)
Culture: NO GROWTH
Culture: NO GROWTH
Special Requests: ADEQUATE
Special Requests: ADEQUATE

## 2021-09-18 NOTE — Chronic Care Management (AMB) (Signed)
°  Care Management   Note  09/18/2021 Name: CUONG MOORMAN MRN: [id number] DOB: May 04, 1966  DEONDRAE MCGRAIL is a 55 y.o. year old male who is a primary care patient of [name], MD. I reached out to [name] by phone today in response to a referral sent by Mr. Mattie Marlin Waszak's primary care provider.   Mr. Spadaccini was given information about care management services today including:  Care management services include personalized support from designated clinical staff supervised by his physician, including individualized plan of care and coordination with other care providers 24/7 contact phone numbers for assistance for urgent and routine care needs. The patient may stop care management services at any time by phone call to the office staff.  Patient agreed to services and verbal consent obtained.   Follow up plan: Telephone appointment with care management team member scheduled for:09/25/21  [place]: [phone]

## 2021-09-18 NOTE — Chronic Care Management (AMB) (Signed)
°  Care Management   Outreach Note  09/18/2021 Name: Jeffrey [name] MRN: [id number] DOB: Apr 22, 1966  Referred by: [name], MD Reason for referral : Care Coordination (Initial outreach to schedule referral with [facility] )   An unsuccessful telephone outreach was attempted today. The patient was referred to the case management team for assistance with care management and care coordination.   Follow Up Plan:  A HIPAA compliant phone message was left for the patient providing contact information and requesting a return call.  The care management team will reach out to the patient again over the next 7 days.  If patient returns call to provider office, please advise to call [place]* at [phone].*  [place] Management  Direct Dial: [phone]

## 2021-09-19 ENCOUNTER — Telehealth: Payer: Self-pay | Admitting: Family Medicine

## 2021-09-25 ENCOUNTER — Ambulatory Visit: Payer: 59

## 2021-09-26 NOTE — Patient Instructions (Signed)
Mr. Newbury  it was nice speaking with you. Please call me directly [phone] if you have questions about the goals we discussed.  Patient Care Plan: RN Case Manager     Problem Identified: Coping Skills (General Plan of Care for a patient with diabetes type 2)      Long-Range Goal: The patient will learn how to manage and prevent hypo and hyper glycemic episodes by using medication as prescribed,. cheking blood sugars and ordered and following PCP plan.   Start Date: 09/25/2021  Expected End Date: 11/06/2021  Note:   Current Barriers:  Knowledge Deficits related to plan of care for management of DMII  Chronic Disease Management support and education needs related to DMII  RNCM Clinical Goal(s):  Patient will verbalize understanding of plan for management of DMII as evidenced by following the plan given to him by his PCP. verbalize basic understanding of DMII disease process and self health management plan as evidenced by no admissions to the hospital with in the next month by checking his blood sugars, taking his medications and monitoring his diet.  through collaboration with Consulting civil engineer, provider, and care team.   Interventions: 1:1 collaboration with primary care provider regarding development and update of comprehensive plan of care as evidenced by provider attestation and co-signature Inter-disciplinary care team collaboration (see longitudinal plan of care) Evaluation of current treatment plan related to  self management and patient's adherence to plan as established by provider   Diabetes:  (Status: New goal.) Long Term Goal   Lab Results  Component Value Date   HGBA1C 5.7 (H) 07/31/2021  Assessed patient's understanding of A1c goal: <7% Reviewed medications with patient and discussed importance of medication adherence;        Counseled on importance of regular laboratory monitoring as prescribed;        Discussed plans with patient for ongoing care management follow  up and provided patient with direct contact information for care management team;      Reviewed scheduled/upcoming provider appointments including: 10/02/21 at [facility] with [place]     Review of patient status, including review of consultants reports, relevant laboratory and other test results, and medications completed;       09/25/21:  I spoke with Mr. Borges, who checks his blood sugar daily. His blood sugar is today fasting 151. He is wearing the [place] 2, and he likes this meter. He does not like Dexcom 6. He wants to continue using the [place]. He has an appointment with Dr. Caron Presume on 10/02/21 to discuss his diabetes. He said his a1c was 5.7 a month ago, and he was told that was too low. I advised him to talk with his physician to see what level he would like him to be.  Patient Goals/Self Care Activities: -Patient/Caregiver will self-administer medications as prescribed as evidenced by self-report/primary caregiver report  -Patient/Caregiver will attend all scheduled provider appointments as evidenced by clinician review of documented attendance to scheduled appointments and patient/caregiver report -Patient/Caregiver will call pharmacy for medication refills as evidenced by patient report and review of pharmacy fill history as appropriate -Patient/Caregiver will call provider office for new concerns or questions as evidenced by review of documented incoming telephone call notes and patient report -Patient/Caregiver verbalizes understanding of plan -Patient/Caregiver will focus on medication adherence by taking medications as prescribed -check blood sugar at prescribed times -check blood sugar if I feel it is too high or too low -record values and write them down take them to all  doctor visits       Mr. Santino received Care Management services today:  Care Management services include personalized support from designated clinical staff supervised by his physician, including individualized plan  of care and coordination with other care providers 24/7 contact [phone] for assistance for urgent and routine care needs. Care Management are voluntary services and be declined at any time by calling the office.  The patient verbalized understanding of instructions provided today and declined a print copy of patient instruction materials.    Follow Up Plan: Patient would like continued follow-up.  CCM RNCM will outreach the patient within the next 2 weeks.  Patient will call office if needed prior to next encounter   Lazaro Arms, RN   [phone]

## 2021-09-26 NOTE — Chronic Care Management (AMB) (Signed)
Care Management    RN Visit Note  09/26/2021 Name: Jeffrey [name] MRN: [id number] DOB: 1966-04-28  Subjective: Jeffrey [name] is a 55 y.o. year old male who is a primary care patient of [name], MD. The care management team was consulted for assistance with disease management and care coordination needs.    Engaged with patient by telephone for initial visit in response to provider referral for case management and/or care coordination services.   Consent to Services:   [name] was given information about Care Management services today including:  Care Management services includes personalized support from designated clinical staff supervised by his physician, including individualized plan of care and coordination with other care providers 24/7 contact phone numbers for assistance for urgent and routine care needs. The patient may stop case management services at any time by phone call to the office staff.  Patient agreed to services and consent obtained.    Assessment:  The patient  is making progress with chronic condition currently but in the past has had issues with Hyper and Hypo glycemia . See Care Plan below for interventions and patient self-care actives. Follow up Plan: Patient would like continued follow-up.  CCM RNCM will outreach the patient within the next 2 2 weeks.  Patient will call office if needed prior to next encounter  Review of patient past medical history, allergies, medications, health status, including review of consultants reports, laboratory and other test data, was performed as part of comprehensive evaluation and provision of chronic care management services.   SDOH (Social Determinants of Health) assessments and interventions performed:    Care Plan  Allergies  Allergen Reactions   Lisinopril Other (See Comments)    Pt reports nose bleed.   Atorvastatin     Cause Muscle Weakness   Drug Ingredient [Spironolactone]     Hyperkalemia     Outpatient Encounter Medications as of 09/25/2021  Medication Sig Note   amoxicillin (AMOXIL) 500 MG capsule Take 1 capsule (500 mg total) by mouth every 8 (eight) hours.    aspirin EC 81 MG tablet Take 1 tablet (81 mg total) by mouth at bedtime.    Continuous Blood Gluc Sensor (FREESTYLE LIBRE 2 SENSOR) MISC Use as directed    Ensure Max Protein (ENSURE MAX PROTEIN) LIQD Take 330 mLs (11 oz total) by mouth 3 (three) times daily between meals.    folic acid (FOLVITE) 1 MG tablet Take 1 tablet (1 mg total) by mouth daily.    gabapentin (NEURONTIN) 300 MG capsule Take 1 capsule (300 mg total) by mouth 3 (three) times daily.    insulin glargine (LANTUS) 100 unit/mL SOPN Inject 5 Units into the skin daily.    levETIRAcetam (KEPPRA) 1000 MG tablet Take 1 tablet (1,000 mg total) by mouth 2 (two) times daily.    metFORMIN (GLUCOPHAGE-XR) 500 MG 24 hr tablet Take 1 tablet (500 mg total) by mouth 2 (two) times daily with a meal.    Multiple Vitamin (MULTIVITAMIN WITH MINERALS) TABS tablet Take 1 tablet by mouth daily.    pantoprazole (PROTONIX) 40 MG tablet Take 1 tablet (40 mg total) by mouth 2 (two) times daily.    thiamine (VITAMIN B-1) 100 MG tablet Take 1 tablet (100 mg total) by mouth daily. 09/25/2021: Need to get refill   blood glucose meter kit and supplies Dispense based on patient and insurance preference. Use up to four times daily as directed. (FOR ICD-10 E10.9, E11.9). dispense 90 test strips and  90 lancets.    No facility-administered encounter medications on file as of 09/25/2021.    Patient Active Problem List   Diagnosis Date Noted   Demand ischemia ([place]) 09/16/2021   Hypothermia 09/13/2021   Hypoalbuminemia    Recurrent falls 07/29/2021   Grief counseling 01/04/2021   Incontinence 12/23/2020   Hospital discharge follow-up 11/24/2020   Syncope 11/17/2020   Hypomagnesemia 11/05/2020   Hematemesis with nausea    Alcohol abuse with intoxication ([place])    Duodenal ulcer  hemorrhagic    GI bleed 10/31/2020   Hyperglycemia 10/31/2020   DKA (diabetic ketoacidosis) ([place]) 07/22/2020   Candidiasis 07/22/2020   Hyponatremia 06/28/2020   Eyebrow laceration, left, initial encounter    Hypokalemia    Hyperlipidemia associated with type 2 diabetes mellitus (HCC)    MGUS (monoclonal gammopathy of unknown significance) 05/31/2020   Seizure disorder ([place]) 03/16/2020   Type 2 diabetes mellitus with hyperglycemia, with long-term current use of insulin ([place]) 03/16/2020   Tremor 03/16/2020   Colon cancer screening 02/22/2020   Stage 3a chronic kidney disease ([place]) 01/29/2020   Hyperphosphatemia 01/29/2020   Major depressive disorder, recurrent episode ([place]) 01/13/2018   Weakness    Sepsis secondary to UTI ([place])    Hypotension 07/27/2017   Seizure ([place]) 04/08/2017   Proteinuria 11/23/2015   Essential hypertension    Cirrhosis ([place]) 03/22/2015   Ataxia 03/20/2015   Elevated liver enzymes    Dysarthria 03/09/2015   AKI (acute kidney injury) ([place])    Acute blood loss anemia 11/17/2014   ETOH abuse    Tachycardia    Hematemesis 11/16/2014   Melena 10/10/2014   Erosive esophagitis 10/10/2014   Anemia of chronic disease    Somnolence 10/08/2014   Acute kidney injury ([place])    OSA on CPAP 12/28/2012   Type 2 diabetes mellitus with hypoglycemia without coma ([place]) 12/07/2012   Cardiac arrest ([place]) 12/07/2012   Anoxic brain injury ([place]) 12/07/2012   ERECTILE DYSFUNCTION 04/14/2007   Type 2 diabetes mellitus with diabetic neuropathy, with long-term current use of insulin ([place]) 12/04/2006   HYPERCHOLESTEROLEMIA 12/04/2006    Conditions to be addressed/monitored: DMII  Care Plan : RN Case Manager  Updates made by Lazaro Arms, RN since 09/26/2021 12:00 AM     Problem: Coping Skills (General Plan of Care for a patient with diabetes type 2)      Long-Range Goal: The patient will learn how to manage and prevent hypo and hyper glycemic episodes by using medication  as prescribed,. cheking blood sugars and ordered and following PCP plan.   Start Date: 09/25/2021  Expected End Date: 11/06/2021  Note:   Current Barriers:  Knowledge Deficits related to plan of care for management of DMII  Chronic Disease Management support and education needs related to DMII  RNCM Clinical Goal(s):  Patient will verbalize understanding of plan for management of DMII as evidenced by following the plan given to him by his PCP. verbalize basic understanding of DMII disease process and self health management plan as evidenced by no admissions to the hospital with in the next month by checking his blood sugars, taking his medications and monitoring his diet.  through collaboration with RN Care manager, provider, and care team.   Interventions: 1:1 collaboration with primary care provider regarding development and update of comprehensive plan of care as evidenced by provider attestation and co-signature Inter-disciplinary care team collaboration (see longitudinal plan of care) Evaluation of current treatment plan related to  self management and patient's  adherence to plan as established by provider   Diabetes:  (Status: New goal.) Long Term Goal   Lab Results  Component Value Date   HGBA1C 5.7 (H) 07/31/2021  Assessed patient's understanding of A1c goal: <7% Reviewed medications with patient and discussed importance of medication adherence;        Counseled on importance of regular laboratory monitoring as prescribed;        Discussed plans with patient for ongoing care management follow up and provided patient with direct contact information for care management team;      Reviewed scheduled/upcoming provider appointments including: 10/02/21 at [facility] with [place]     Review of patient status, including review of consultants reports, relevant laboratory and other test results, and medications completed;       09/25/21:  I spoke with Mr. Broyles, who checks his blood sugar daily.  His blood sugar is today fasting 151. He is wearing the [place] 2, and he likes this meter. He does not like Dexcom 6. He wants to continue using the [place]. He has an appointment with Dr. Caron Presume on 10/02/21 to discuss his diabetes. He said his a1c was 5.7 a month ago, and he was told that was too low. I advised him to talk with his physician to see what level he would like him to be.  Patient Goals/Self Care Activities: -Patient/Caregiver will self-administer medications as prescribed as evidenced by self-report/primary caregiver report  -Patient/Caregiver will attend all scheduled provider appointments as evidenced by clinician review of documented attendance to scheduled appointments and patient/caregiver report -Patient/Caregiver will call pharmacy for medication refills as evidenced by patient report and review of pharmacy fill history as appropriate -Patient/Caregiver will call provider office for new concerns or questions as evidenced by review of documented incoming telephone call notes and patient report -Patient/Caregiver verbalizes understanding of plan -Patient/Caregiver will focus on medication adherence by taking medications as prescribed -check blood sugar at prescribed times -check blood sugar if I feel it is too high or too low -record values and write them down take them to all doctor visits      Lazaro Arms RN, BSN, [place] Management Coordinator [place] Phone: [phone] I Fax: [phone]

## 2021-09-30 ENCOUNTER — Other Ambulatory Visit: Payer: Self-pay | Admitting: Family Medicine

## 2021-10-01 ENCOUNTER — Other Ambulatory Visit: Payer: Self-pay | Admitting: Family Medicine

## 2021-10-02 ENCOUNTER — Encounter: Payer: Self-pay | Admitting: Family Medicine

## 2021-10-02 ENCOUNTER — Ambulatory Visit (INDEPENDENT_AMBULATORY_CARE_PROVIDER_SITE_OTHER): Payer: 59 | Admitting: Family Medicine

## 2021-10-02 ENCOUNTER — Other Ambulatory Visit: Payer: Self-pay

## 2021-10-02 VITALS — BP 161/102 | HR 104 | Ht 70.0 in | Wt 241.2 lb

## 2021-10-02 DIAGNOSIS — Z794 Long term (current) use of insulin: Secondary | ICD-10-CM

## 2021-10-02 DIAGNOSIS — E1140 Type 2 diabetes mellitus with diabetic neuropathy, unspecified: Secondary | ICD-10-CM | POA: Diagnosis not present

## 2021-10-02 DIAGNOSIS — D649 Anemia, unspecified: Secondary | ICD-10-CM

## 2021-10-02 DIAGNOSIS — K921 Melena: Secondary | ICD-10-CM

## 2021-10-02 MED ORDER — FREESTYLE LIBRE 2 SENSOR MISC: 3 refills | Status: DC

## 2021-10-02 MED ORDER — LANTUS SOLOSTAR 100 UNIT/ML ~~LOC~~ SOPN: 5.0000 [IU] | PEN_INJECTOR | Freq: Every day | SUBCUTANEOUS | 0 refills | Status: DC

## 2021-10-02 NOTE — Patient Instructions (Signed)
It was great seeing you today!  Your blood sugars fluctuate a considerable amount.  I want to continue on the 5 units of Lantus daily but check your sugars before.  I can check your hemoglobin A1c today but we will check it at the next visit.  I sent a prescription for the continuous glucose monitor to your pharmacy.  I would like to see you early in February and we can hammer out what the best regimen for you is.  If you have any low blood sugars please let me know.  If you have any questions or concerns please call our clinic.  I hope you have a wonderful New Year's!

## 2021-10-02 NOTE — Progress Notes (Signed)
SUBJECTIVE:   CHIEF COMPLAINT / HPI:   Hospital follow up  Patient was recently hospitalized for hypoglycemia as well as altered mental status.  Patient was also found to have coffee-ground emesis.  Patient reports that he has been doing well since his hospital discharge.  He reports that his main concern is his blood sugars.  I asked if anything else is bothering him and he reports no but with a little digging he reports that a few days after his discharge he noticed 2-3 extremely dark black sticky stools.  I ask about if he has followed up with GI yet and he reports that they are supposed to be calling him after the new year to schedule his colonoscopy in the hospital.  He reports that after those 2-3 episodes of stools his stool is since cleared up and for the last 2 to 3 weeks he has been having normal stools.  Reports he is fatigued but otherwise no symptoms of anemia.  Diabetes  Regarding Mr. Mapp's diabetes he reports that he had a continuous glucose monitor placed while in the hospital and it is the been the best thing for her sugars.  He is able to show me his sugars.  He does admit that he had a few low sugars in the 50s and these were on days that he was taking his insulin but forgetting to eat.  He is currently only taking 5 units of Lantus daily.  He does report that if he thinks he is not going to eat he will hold his Lantus.  There are other days where his blood sugars are in the 200s-300s.  Blood sugar ranges on his glucometer are from 53-357.  Reports that he does not really feel the low blood sugars when he has them other than he may feel little weak or tired.  OBJECTIVE:   BP (!) 161/102    Pulse (!) 104    Ht 5\' 10"  (1.778 m)    Wt 241 lb 3.2 oz (109.4 kg)    SpO2 100%    BMI 34.61 kg/m   General: Chronically ill appearing 55 year old male in no acute distress Cardiac: Regular rate and rhythm Respiratory: Normal work of breathing, speaking full sentences Abdomen: Soft and  nontender MSK: Edema in his lower extremities bilaterally.  ASSESSMENT/PLAN:   Melena Patient describes what sounds like episodes of melena after his discharge from the hospital.  He reports that his bowel movements have been completely normal over the last 2 weeks.  Baseline hemoglobin between 10 and 11.  Repeat CBC ordered today.  When these results come back.  Discussed follow-up with GI and patient reports he spoken with GI and they plan on doing endoscopy after the new year.  Type 2 diabetes mellitus with diabetic neuropathy, with long-term current use of insulin [facility]) Patient reports that he is leaving his continuous glucose monitor which was placed while he was in the hospital.  His blood sugars have fluctuated since discharge and are purely dependent on his intake.  He has been taking 5 units of Lantus daily.  Blood sugars have ranged from 53- 357.  Had reports that the low blood sugars were on days that he took his insulin because he thought he was going to eat and did not.  Very difficult to make adjustments with patient's blood sugar regimen.  He reports he has not felt hypoglycemic with these lows.  He is going to continue monitoring his blood sugars with the  current regimen, discussed strict ED and return precautions regarding his blood sugars.  Discussed signs and symptoms of hypoglycemia.  Plan on repeat hemoglobin A1c in 1 month.  I am going to have our pharmacy team touch base with the patient to discuss regimen as well.     [name], MD [place]

## 2021-10-03 ENCOUNTER — Telehealth: Payer: Self-pay | Admitting: Family Medicine

## 2021-10-03 LAB — CBC
Hematocrit: 19.4 % — ABNORMAL LOW (ref 37.5–51.0)
Hemoglobin: 6.3 g/dL — CL (ref 13.0–17.7)
MCH: 28.1 pg (ref 26.6–33.0)
MCHC: 32.5 g/dL (ref 31.5–35.7)
MCV: 87 fL (ref 79–97)
Platelets: 394 10*3/uL (ref 150–450)
RBC: 2.24 x10E6/uL — CL (ref 4.14–5.80)
RDW: 15.2 % (ref 11.6–15.4)
WBC: 6.3 10*3/uL (ref 3.4–10.8)

## 2021-10-03 NOTE — Telephone Encounter (Signed)
Spoke with patient regarding hemoglobin results.  Recommended that he go to the emergency department to have his hemoglobin rechecked.  He reports that he has not had any dark tarry stools in several weeks but did have them for a few days 2 to 3 weeks ago.  Is currently fatigued but no other signs of anemia.  Patient may need transfusion.  Unsure if patient will need an admission but if he does we will gladly accept the admission.  Patient reports that he will go to the emergency department now.

## 2021-10-03 NOTE — Telephone Encounter (Signed)
Attempted to call the patient twice regarding his CBC results.  Hemoglobin came back at 6.3.  Patient reported that after his discharge from the hospital he saw "a few days" of very dark stools which has now resolved.  He reports that he knows he needs to get a colonoscopy and was told by the GI doctors that they would be calling him to set this up after the new year.  If he calls the clinic he needs to go to the emergency department for further evaluation and work-up.

## 2021-10-03 NOTE — Assessment & Plan Note (Signed)
Patient describes what sounds like episodes of melena after his discharge from the hospital.  He reports that his bowel movements have been completely normal over the last 2 weeks.  Baseline hemoglobin between 10 and 11.  Repeat CBC ordered today.  When these results come back.  Discussed follow-up with GI and patient reports he spoken with GI and they plan on doing endoscopy after the new year.

## 2021-10-03 NOTE — Assessment & Plan Note (Signed)
Patient reports that he is leaving his continuous glucose monitor which was placed while he was in the hospital.  His blood sugars have fluctuated since discharge and are purely dependent on his intake.  He has been taking 5 units of Lantus daily.  Blood sugars have ranged from 53- 357.  Had reports that the low blood sugars were on days that he took his insulin because he thought he was going to eat and did not.  Very difficult to make adjustments with patient's blood sugar regimen.  He reports he has not felt hypoglycemic with these lows.  He is going to continue monitoring his blood sugars with the current regimen, discussed strict ED and return precautions regarding his blood sugars.  Discussed signs and symptoms of hypoglycemia.  Plan on repeat hemoglobin A1c in 1 month.  I am going to have our pharmacy team touch base with the patient to discuss regimen as well.

## 2021-10-12 ENCOUNTER — Telehealth: Payer: Self-pay

## 2021-10-12 ENCOUNTER — Telehealth: Payer: 59

## 2021-10-12 NOTE — Telephone Encounter (Signed)
° °  RN Case Manager Care Management   Phone Outreach    10/12/2021 Name: Jeffrey [name] MRN: [id number] DOB: 1966-05-12  Jeffrey [name] is a 56 y.o. year old male who is a primary care patient of [name], MD .   Telephone outreach was unsuccessful A HIPPA compliant phone message was left for the patient providing contact information and requesting a return call.   Follow Up Plan: Will route chart to Care Guide to see if patient would like to reschedule phone appointment    Review of patient status, including review of consultants reports, relevant laboratory and other test results, and collaboration with appropriate care team members and the patient's provider was performed as part of comprehensive patient evaluation and provision of care management services.    Lazaro Arms RN, BSN, [facility] Care Management Coordinator [place] Phone: [phone] Fax: [phone]

## 2021-10-12 NOTE — Telephone Encounter (Signed)
Received phone call from Leonides Grills, SW with Adult protective services regarding patient. SW is needing to speak with provider in regards to medication and appointment compliance and any concerns provider may have for patient.   Please return call to [facility] at [phone].  Talbot Grumbling, RN

## 2021-10-14 NOTE — Telephone Encounter (Signed)
Called Jeffrey [name] on 1/7 as well as 1/8 to discuss the case with her.  She did not answer.  Left voicemail on 1/7 with personal cell phone number so that she can call me back.  Will await her call.

## 2021-10-15 ENCOUNTER — Telehealth: Payer: Self-pay | Admitting: *Deleted

## 2021-10-15 NOTE — Chronic Care Management (AMB) (Signed)
°  Care Management   Note  10/15/2021 Name: [name] MRN: [id number] DOB: [birth year]  Jeffrey [name] is a 56 y.o. year old male who is a primary care patient of [name], MD and is actively engaged with the care management team. I reached out to [name] by phone today to assist with re-scheduling a follow up visit with the RN Case Manager  Follow up plan: Unsuccessful telephone outreach attempt made. A HIPAA compliant phone message was left for the patient providing contact information and requesting a return call.  The care management team will reach out to the patient again over the next 7 days.  If patient returns call to provider office, please advise to call [place] at [phone].  [place] Management  Direct Dial: [phone]

## 2021-10-16 ENCOUNTER — Other Ambulatory Visit: Payer: Self-pay | Admitting: Family Medicine

## 2021-10-23 NOTE — Chronic Care Management (AMB) (Signed)
°  Care Management   Note  10/23/2021 Name: Jeffrey [name] MRN: [id number] DOB: 01/23/1966  Jeffrey [name] is a 56 y.o. year old male who is a primary care patient of [name], MD and is actively engaged with the care management team. I reached out to Jeffrey [name] by phone today to assist with re-scheduling a follow up visit with the RN Case Manager  Follow up plan: Unsuccessful telephone outreach attempt made. A HIPAA compliant phone message was left for the patient providing contact information and requesting a return call. The care management team will reach out to the patient again over the next 7 days.  If patient returns call to provider office, please advise to call [place] at [phone].  [place] Management  Direct Dial: [phone]

## 2021-10-31 NOTE — Chronic Care Management (AMB) (Signed)
°  Care Management   Note  10/31/2021 Name: [name] MRN: [id number] DOB: 18-Jan-1966  Jeffrey [name] is a 56 y.o. year old male who is a primary care patient of [name], MD and is actively engaged with the care management team. I reached out to [name] by phone today to assist with re-scheduling a follow up visit with the RN Case Manager  Follow up plan: Unsuccessful telephone outreach attempt made. A HIPAA compliant phone message was left for the patient providing contact information and requesting a return call. Unable to make contact on outreach attempts x 3. PCP [name], MD notified via routed documentation in medical record.We have been unable to make contact with the patient for follow up. The care management team is available to follow up with the patient after provider conversation with the patient regarding recommendation for care management engagement and subsequent re-referral to the care management team.   [place] Management  Direct Dial: [phone]

## 2021-11-05 ENCOUNTER — Other Ambulatory Visit: Payer: Self-pay | Admitting: Family Medicine

## 2021-11-13 ENCOUNTER — Ambulatory Visit: Payer: 59 | Admitting: Family Medicine

## 2021-11-19 ENCOUNTER — Encounter: Payer: Self-pay | Admitting: Family Medicine

## 2021-11-19 ENCOUNTER — Ambulatory Visit (INDEPENDENT_AMBULATORY_CARE_PROVIDER_SITE_OTHER): Payer: Managed Care, Other (non HMO) | Admitting: Family Medicine

## 2021-11-19 ENCOUNTER — Other Ambulatory Visit: Payer: Self-pay

## 2021-11-19 VITALS — BP 166/110 | HR 121 | Wt 225.4 lb

## 2021-11-19 DIAGNOSIS — I10 Essential (primary) hypertension: Secondary | ICD-10-CM

## 2021-11-19 DIAGNOSIS — E1140 Type 2 diabetes mellitus with diabetic neuropathy, unspecified: Secondary | ICD-10-CM

## 2021-11-19 DIAGNOSIS — D638 Anemia in other chronic diseases classified elsewhere: Secondary | ICD-10-CM

## 2021-11-19 DIAGNOSIS — K922 Gastrointestinal hemorrhage, unspecified: Secondary | ICD-10-CM | POA: Diagnosis not present

## 2021-11-19 DIAGNOSIS — Z794 Long term (current) use of insulin: Secondary | ICD-10-CM | POA: Diagnosis not present

## 2021-11-19 DIAGNOSIS — D649 Anemia, unspecified: Secondary | ICD-10-CM | POA: Diagnosis not present

## 2021-11-19 LAB — POCT GLYCOSYLATED HEMOGLOBIN (HGB A1C): HbA1c, POC (controlled diabetic range): 7.8 % — AB (ref 0.0–7.0)

## 2021-11-19 LAB — POCT HEMOGLOBIN: Hemoglobin: 7.4 g/dL — AB (ref 11–14.6)

## 2021-11-19 MED ORDER — LOSARTAN POTASSIUM 50 MG PO TABS: 50.0000 mg | ORAL_TABLET | Freq: Every day | ORAL | 3 refills | Status: DC

## 2021-11-19 NOTE — Progress Notes (Signed)
SUBJECTIVE:   CHIEF COMPLAINT / HPI:   Diabetes Patient reports concerns regarding his diabetes today.  He is currently taking metformin as well as 5 units of Lantus daily.  He brings his glucose monitor with him with blood sugars ranging 47-high.  He has recurrent episodes of hypoglycemia between 47 and 65 usually around 9 to 10 AM.  He does report decreased appetite and p.o. intake over the last few weeks.  Reports that when he sees his low blood sugars he eats a large meal to bring the blood sugar up but then it goes too high.  He does not have any mealtime coverage.  He reports that he has been on Ozempic in the past and is unsure why it was stopped.  Most recent hemoglobin A1c was 5.7.  HTN Patient has history of hypertension and was on medications in the past but these medications were discontinued because he was lower resting blood pressures with episodes of orthostatic hypotension.  At previous visits his blood pressures have been well controlled.  Today's blood pressure was elevated at 188/118 initially.  Denies any headaches, chest pain, shortness of breath.  Anemia Patient with recent GI bleed.  Reports that since being seen last he has not had any black tarry stools.  He acknowledges that we discussed him going to the hospital after the last visit and he reported that he would but did not because he is not interested in receiving a blood transfusion.  He received a blood transfusion in 2014 and a week later had heart attack and he attributes the transfusion to the heart attack.  We discussed that this is not likely but patient is set that he does not want a transfusion. OBJECTIVE:   BP (!) 166/110    Pulse (!) 121    Wt 225 lb 6.4 oz (102.2 kg)    SpO2 100%    BMI 32.34 kg/m   General: Chronically ill-appearing 55 year old male Cardiac: Regular rate and rhythm, no murmurs appreciated Respiratory: Normal work of breathing, lungs are clear to auscultation bilaterally Abdomen: Soft,  nontender MSK: Patient has considerable edema of his left upper extremity which she reports occurs occasionally.  Is able to ambulate with walker. ASSESSMENT/PLAN:   Type 2 diabetes mellitus with diabetic neuropathy, with long-term current use of insulin ([place]) Patient brings glucometer with him today.  Blood sugars fluctuate from hypoglycemia to hyperglycemia.  Hemoglobin A1c today of 7.7 from 5.7 at previous check.  Is only on 5 units of Lantus daily along with metformin.  Discussed options to help better control his blood glucoses and keep him regular but this patient is very difficult to control.  Consider discontinuing Lantus and initiating Ozempic but patient reports that he was on Ozempic in the past and is unsure why it was discontinued.  Spoke with pharmacy team and scheduled him to see them on 2/16.  We will continue current regimen at this time but anticipate changes to his medication regimen will be made at the next visit.  Discussed at length signs and symptoms of hypoglycemia and provided recommendations if these occur..  Anemia of chronic disease Patient with significant anemia at previous visit.  He was recommended to go to the emergency department for transfusion but declined.  Repeat hemoglobin today of approximately 7.  We will send CBC for further evaluation.  Also collecting ferritin level today.  Essential hypertension Patient with significantly elevated blood pressure today of 188/110 initially and repeat of 166/110.  He  was previously on antihypertensive medications but these were discontinued due to hypotension.  Checking BMP today but plan on reinitiating losartan 50 mg daily and follow-up in 2 weeks for blood pressure check and further discussion on diabetes.     [name], MD [place]

## 2021-11-19 NOTE — Patient Instructions (Signed)
It was good seeing you today.  Your blood sugars are like a roller coaster and I think you need adjustments made to your diabetes medications.  You can decrease your Lantus to 3 units daily if you would like but the big thing is I want you to see Dr. Valentina Lucks to discuss possible discontinuation of your Lantus and starting another medication like Ozempic.  Please keep a log of your blood sugars until your appointment with him on Thursday.  Regarding your blood pressure I want to start a medication called losartan.  I am starting you on a very low dose.  If you notice any symptoms such as lightheadedness please check your blood pressure and hold the medication until you can be seen again.  I will follow-up with you in a few weeks regarding the blood pressure.  I am collecting lab work today and we will call you with those results.  If you have any questions or concerns call the clinic.  I hope you have a great day!

## 2021-11-20 ENCOUNTER — Telehealth: Payer: Self-pay | Admitting: Family Medicine

## 2021-11-20 DIAGNOSIS — K7030 Alcoholic cirrhosis of liver without ascites: Secondary | ICD-10-CM

## 2021-11-20 DIAGNOSIS — E8809 Other disorders of plasma-protein metabolism, not elsewhere classified: Secondary | ICD-10-CM

## 2021-11-20 LAB — CBC
Hematocrit: 25.1 % — ABNORMAL LOW (ref 37.5–51.0)
Hemoglobin: 7.5 g/dL — ABNORMAL LOW (ref 13.0–17.7)
MCH: 23 pg — ABNORMAL LOW (ref 26.6–33.0)
MCHC: 29.9 g/dL — ABNORMAL LOW (ref 31.5–35.7)
MCV: 77 fL — ABNORMAL LOW (ref 79–97)
Platelets: 375 10*3/uL (ref 150–450)
RBC: 3.26 x10E6/uL — ABNORMAL LOW (ref 4.14–5.80)
RDW: 18.9 % — ABNORMAL HIGH (ref 11.6–15.4)
WBC: 5.8 10*3/uL (ref 3.4–10.8)

## 2021-11-20 LAB — BASIC METABOLIC PANEL
BUN/Creatinine Ratio: 9 (ref 9–20)
BUN: 10 mg/dL (ref 6–24)
CO2: 22 mmol/L (ref 20–29)
Calcium: 6.5 mg/dL — CL (ref 8.7–10.2)
Chloride: 98 mmol/L (ref 96–106)
Creatinine, Ser: 1.16 mg/dL (ref 0.76–1.27)
Glucose: 282 mg/dL — ABNORMAL HIGH (ref 70–99)
Potassium: 3.7 mmol/L (ref 3.5–5.2)
Sodium: 141 mmol/L (ref 134–144)
eGFR: 73 mL/min/{1.73_m2} (ref >59)

## 2021-11-20 LAB — FERRITIN: Ferritin: 36 ng/mL (ref 30–400)

## 2021-11-20 NOTE — Addendum Note (Signed)
Addended by: Concepcion Living on: 11/20/2021 11:03 AM   Modules accepted: Orders

## 2021-11-20 NOTE — Telephone Encounter (Signed)
**  After Hours/ Emergency Line Call**  Received an after hours page regarding a critical lab result. Mr. Borak calcium that was critically low at 6.5.  No con-current albumin available for correction.  Recent albumin (Dec 2022) <1.5 - 1.8 which would correct to Ca 8.3-8.5.   Left secure voicemail to call the office regarding his lab and further instructions.   Will forward to PCP who saw him in clinic yesterday.   Lyndee Hensen, DO 11/20/21 PGY-3, [place]

## 2021-11-20 NOTE — Assessment & Plan Note (Addendum)
Patient brings glucometer with him today.  Blood sugars fluctuate from hypoglycemia to hyperglycemia.  Hemoglobin A1c today of 7.7 from 5.7 at previous check.  Is only on 5 units of Lantus daily along with metformin.  Discussed options to help better control his blood glucoses and keep him regular but this patient is very difficult to control.  Consider discontinuing Lantus and initiating Ozempic but patient reports that he was on Ozempic in the past and is unsure why it was discontinued.  Spoke with pharmacy team and scheduled him to see them on 2/16.  We will continue current regimen at this time but anticipate changes to his medication regimen will be made at the next visit.  Discussed at length signs and symptoms of hypoglycemia and provided recommendations if these occur.[name]

## 2021-11-20 NOTE — Assessment & Plan Note (Signed)
Patient with significantly elevated blood pressure today of 188/110 initially and repeat of 166/110.  He was previously on antihypertensive medications but these were discontinued due to hypotension.  Checking BMP today but plan on reinitiating losartan 50 mg daily and follow-up in 2 weeks for blood pressure check and further discussion on diabetes.

## 2021-11-20 NOTE — Assessment & Plan Note (Signed)
Patient with significant anemia at previous visit.  He was recommended to go to the emergency department for transfusion but declined.  Repeat hemoglobin today of approximately 7.  We will send CBC for further evaluation.  Also collecting ferritin level today.

## 2021-11-20 NOTE — Telephone Encounter (Signed)
Attempted to call the patient regarding these results.  Attempted both of his cell phone numbers listed but was unsuccessful in contacting him.  Would like for him to have a CMP collected to evaluate albumin level as well as a urine protein creatinine ratio and INR.  This can be collected when he comes to see the pharmacy team on Thursday.

## 2021-11-22 ENCOUNTER — Encounter: Payer: Self-pay | Admitting: Pharmacist

## 2021-11-22 ENCOUNTER — Other Ambulatory Visit: Payer: Self-pay

## 2021-11-22 ENCOUNTER — Ambulatory Visit (INDEPENDENT_AMBULATORY_CARE_PROVIDER_SITE_OTHER): Payer: Managed Care, Other (non HMO) | Admitting: Pharmacist

## 2021-11-22 VITALS — BP 124/82 | HR 108 | Wt 235.8 lb

## 2021-11-22 DIAGNOSIS — I10 Essential (primary) hypertension: Secondary | ICD-10-CM | POA: Diagnosis not present

## 2021-11-22 DIAGNOSIS — F331 Major depressive disorder, recurrent, moderate: Secondary | ICD-10-CM | POA: Diagnosis not present

## 2021-11-22 DIAGNOSIS — K7030 Alcoholic cirrhosis of liver without ascites: Secondary | ICD-10-CM

## 2021-11-22 DIAGNOSIS — Z794 Long term (current) use of insulin: Secondary | ICD-10-CM

## 2021-11-22 DIAGNOSIS — E1140 Type 2 diabetes mellitus with diabetic neuropathy, unspecified: Secondary | ICD-10-CM

## 2021-11-22 MED ORDER — DULOXETINE HCL 30 MG PO CPEP: 30.0000 mg | ORAL_CAPSULE | Freq: Every day | ORAL | 3 refills | Status: DC

## 2021-11-22 MED ORDER — OZEMPIC (0.25 OR 0.5 MG/DOSE) 2 MG/1.5ML ~~LOC~~ SOPN: PEN_INJECTOR | SUBCUTANEOUS | 2 refills | Status: DC

## 2021-11-22 NOTE — Progress Notes (Signed)
S:    Chief Complaint  Patient presents with   Medication Management    Diabetes Medication Management   Jeffrey [name] is a 56 y.o. male who presents for diabetes evaluation, education, and management. PMH is significant for seizures, HTN, anemia of chronic disease. Anoxic brain injury. Patient was referred and last seen by Primary Care Provider, Dr. Caron Presume, on 11/19/2021. At prior appointment, added losartan and discussed DM medications.  Today, he arrives in good spirits and presents with assistance of a walker  Family/Social History: Recently widowed (2022); three step kids (around [place]) who live at home with him.  Current diabetes medications include: Lantus (insulin glargine) 5 units daily, metformin XR 500mg  BID Current hypertension medications include: losartan 50mg , furosemide 40mg  daily Current hyperlipidemia medications include: none; documented statin intolerance Patient has a CGM - Libre 2  Patient states that He is taking his medications as prescribed. Patient reports adherence with medications. Patient states that never misses his medications.  Do you feel that your medications are working for you? Yes, states mirtazapine does not work for him Have you been experiencing any side effects to the medications prescribed? no  Insurance coverage: [employer] med[employer]  Patient reports hypoglycemic events. Corrects with juice.    Patient reports neuropathy (nerve pain).  Patient reported dietary habits: Eats 1 meal/day regularly; patient often misses breakfast or lunch. Patient endorses lack of appetite and routine when it comes to preparing meals. Breakfast: often misses Lunch: often misses Dinner: fish sandwich, seafood Snacks: cottage cheese with mixed fruit Drinks: diet ginger ale, water  Patient endorses food recent insecurity. Uses food stamps and SSI. Patient reports better access to food.  Patient-reported exercise habits: limited daily activity  O:  Physical  Exam Pulmonary:     Effort: Pulmonary effort is normal.  Neurological:     Mental Status: He is alert.  Psychiatric:        Behavior: Behavior normal.        Judgment: Judgment normal.    Review of Systems  Constitutional:  Positive for malaise/fatigue.  Neurological:  Positive for tingling.  Psychiatric/Behavioral:  Positive for depression. The patient has insomnia.   All other systems reviewed and are negative.  Freestyle Libre:  GMI: 8.0% Glucose Variability: 40%  Very High 29% High 28% Target Range 36% Low 0% Very Low 1%   Lab Results  Component Value Date   HGBA1C 7.8 (A) 11/19/2021   Vitals:   11/22/21 1335  BP: 124/82  Pulse: (!) 108  SpO2: 97%    Lipid Panel     Component Value Date/Time   CHOL 110 02/27/2021 1511   TRIG 106 02/27/2021 1511   HDL 32 (L) 02/27/2021 1511   CHOLHDL 3.4 02/27/2021 1511   CHOLHDL 4.3 05/27/2016 1123   VLDL 58 (H) 05/27/2016 1123   LDLCALC 58 02/27/2021 1511   LDLDIRECT 89 12/23/2012 1414    A/P: Diabetes longstanding currently uncontrolled. Patient is able to verbalize appropriate hypoglycemia management plan. Medication adherence appears optimal. Control is suboptimal due to multifactorial reasons including inconsistent meal intake (skipping Breakfast and rarely eating anytihng mid-day) resulting in low readings throughout the waking hours of the day.  -Discontinued basal insulin Lantus (insulin glargine).  -Re-Started GLP-1 Ozempic (generic name semaglutide) 0.25mg  weekly for two weeks. Then Increase to 0.5mg  once weekly. -Continued metformin XR 500mg  BID.  -Patient educated on purpose, proper use, and potential adverse effects.  -Extensively discussed pathophysiology of diabetes, recommended lifestyle interventions, dietary effects on blood  sugar control.  -Counseled on s/sx of and management of hypoglycemia.  -Discussed adding a protein shake such as Ensure or adding a small meal at lunch to maintain caloric intake.   Taste disturbance and lack of appetite could be keys to improving eating consistently.  -Next A1c anticipated May 2023.   ASCVD risk - primary prevention in patient with diabetes. Last LDL is at goal of <58 mg/dL. ASCVD risk factors include DM. -no statin due to past statin intolerance.  Hypertension longstanding currently uncontrolled. Blood pressure goal of <130/80 mmHg. Medication adherence reported as optimal. Blood pressure control is suboptimal due to lack of medication optimization. -Continue losartan 50mg .  Depression longstanding due to recent passing of spouse. Reports Mirtazapine 30mg  is not helping with sleep and appetite.Discussed with Dr. Andria Frames.  -Started duloxetine 30mg  daily. Will increase at next visit.  Multiple handicapped children at home.  Patient challenged with supporting their needs following loss of his spouse last year. Requested assistance in any form.  I agreed to refer him to our Education officer, museum for additional resources.   Written patient instructions provided. Patient verbalized understanding of treatment plan. Total time in face to face counseling 55 minutes.    Follow up pharmacy team in a month. Follow up with PCP March 1. Patient seen with Gala Murdoch, PharmD Candidate, and Rebbeca Paul, PharmD - PGY2 Pharmacy Resident.  [name]

## 2021-11-22 NOTE — Assessment & Plan Note (Signed)
Diabetes longstanding currently uncontrolled. Patient is able to verbalize appropriate hypoglycemia management plan. Medication adherence appears optimal. Control is suboptimal due to multifactorial reasons including inconsistent meal intake (skipping Breakfast and rarely eating anytihng mid-day) resulting in low readings throughout the waking hours of the day.  -Discontinued basal insulin Lantus (insulin glargine).  -Re-Started GLP-1 Ozempic (generic name semaglutide) 0.25mg  weekly for two weeks. Then Increase to 0.5mg  once weekly. -Continued metformin XR 500mg  BID.  -Patient educated on purpose, proper use, and potential adverse effects.  -Extensively discussed pathophysiology of diabetes, recommended lifestyle interventions, dietary effects on blood sugar control.  -Counseled on s/sx of and management of hypoglycemia.  -Discussed adding a protein shake such as Ensure or adding a small meal at lunch to maintain caloric intake.  Taste disturbance and lack of appetite could be keys to improving eating consistently.  -Next A1c anticipated May 2023.   Depression longstanding due to recent passing of spouse. Reports Mirtazapine 30mg  is not helping with sleep and appetite.Discussed with Dr. Andria Frames.  -Started duloxetine 30mg  daily. Will increase at next visit. (May also help with neuropathic pain - numbness)

## 2021-11-22 NOTE — Assessment & Plan Note (Signed)
Depression longstanding due to recent passing of spouse. Reports Mirtazapine 30mg  is not helping with sleep and appetite.Discussed with Dr. Andria Frames.  -Started duloxetine 30mg  daily. Will increase at next visit.

## 2021-11-22 NOTE — Progress Notes (Signed)
Reviewed: I agree with Dr. Koval's documentation and management. 

## 2021-11-22 NOTE — Patient Instructions (Addendum)
It was nice to see you today!  Your goal blood sugar is 80-130 before eating and less than 180 after eating.  Medication Changes: Begin Ozempic 0.25 mg once weekly for 2 weeks, then increase to 0.5 mg once weekly.   Stop taking Lantus.   Continue metformin 500 mg twice daily.   Start duloxetine 30 mg daily. Continue mirtazapine.   Your next visit with Dr. Caron Presume is on March 1st. Then come back to see Dr. Valentina Lucks on March 16th.   Monitor blood sugars at home and keep a log (glucometer or piece of paper) to bring with you to your next visit.  Keep up the good work with diet and exercise. Aim for a diet full of vegetables, fruit and lean meats (chicken, [place], fish). Try to limit salt intake by eating fresh or frozen vegetables (instead of canned), rinse canned vegetables prior to cooking and do not add any additional salt to meals.

## 2021-11-23 ENCOUNTER — Telehealth: Payer: Self-pay | Admitting: Family Medicine

## 2021-11-23 LAB — COMPREHENSIVE METABOLIC PANEL
ALT: 27 IU/L (ref 0–44)
AST: 44 IU/L — ABNORMAL HIGH (ref 0–40)
Albumin/Globulin Ratio: 0.5 — ABNORMAL LOW (ref 1.2–2.2)
Albumin: 1.9 g/dL — CL (ref 3.8–4.9)
Alkaline Phosphatase: 287 IU/L — ABNORMAL HIGH (ref 44–121)
BUN/Creatinine Ratio: 10 (ref 9–20)
BUN: 15 mg/dL (ref 6–24)
Bilirubin Total: <0.2 mg/dL (ref 0.0–1.2)
CO2: 21 mmol/L (ref 20–29)
Calcium: 6 mg/dL — CL (ref 8.7–10.2)
Chloride: 98 mmol/L (ref 96–106)
Creatinine, Ser: 1.47 mg/dL — ABNORMAL HIGH (ref 0.76–1.27)
Globulin, Total: 3.7 g/dL (ref 1.5–4.5)
Glucose: 304 mg/dL — ABNORMAL HIGH (ref 70–99)
Potassium: 3.5 mmol/L (ref 3.5–5.2)
Sodium: 142 mmol/L (ref 134–144)
Total Protein: 5.6 g/dL — ABNORMAL LOW (ref 6.0–8.5)
eGFR: 55 mL/min/{1.73_m2} — ABNORMAL LOW (ref >59)

## 2021-11-23 LAB — PROTEIN / CREATININE RATIO, URINE
Creatinine, Urine: 193.2 mg/dL
Protein, Ur: 357.4 mg/dL
Protein/Creat Ratio: 1850 mg/g creat — ABNORMAL HIGH (ref 0–200)

## 2021-11-23 LAB — PROTIME-INR
INR: 1.2 (ref 0.9–1.2)
Prothrombin Time: 12.4 s — ABNORMAL HIGH (ref 9.1–12.0)

## 2021-11-23 NOTE — Telephone Encounter (Addendum)
**  After Hours/ Emergency Line Call**  Received an after hours page regarding critical lab result. Jeffrey [name] calcium reported as critically low at 6.0. Albumin was 1.9, which calculates to a correction of 7.7. This remains low, though not critical. Given that the lab is not critical after correction and awaiting further lab results, will need to consider oral supplementation. Will forward to PCP.   Rise Patience, DO  PGY-2, [facility] 11/23/2021 3:24 AM

## 2021-11-23 NOTE — Telephone Encounter (Signed)
Called patient to discuss the results from the BMP.  He has an AKI at this time and wanted to encourage adequate hydration and recheck when he comes in for follow-up.  Regarding his hypocalcemia unclear etiology but most likely related to poor nutritional status.  I have placed orders for a PTH and ionized calcium to be collected but was unable to instruct patient to come for these labs.  If he calls I would like for him to have the lab drawn prior to our next visit but if not we can have it drawn when I see him on March 1.

## 2021-11-28 ENCOUNTER — Telehealth: Payer: Self-pay | Admitting: *Deleted

## 2021-11-28 NOTE — Telephone Encounter (Signed)
Received fax about pts ozempic Rx.   Note says that the ozempic is not covered on insurance.  Alternatives are Bydureon, Byetta, Trulicity. Please clarify & advise. Routing to PCP and to doctor who sent in Rx. Omayra Tulloch Zimmerman Rumple, CMA

## 2021-11-29 MED ORDER — TRULICITY 0.75 MG/0.5ML ~~LOC~~ SOAJ: 0.7500 mg | SUBCUTANEOUS | 1 refills | Status: DC

## 2021-11-29 NOTE — Telephone Encounter (Signed)
Insurance formulary issue with Ozempic coverage.   Patient has taken Trulicity in the past.   Contacted pharmacy and verified availability of trulicity (dulaglutide) 0.75mg .  Sent new prescription for Trulicity.

## 2021-11-29 NOTE — Telephone Encounter (Signed)
Noted and agree. 

## 2021-12-03 ENCOUNTER — Other Ambulatory Visit: Payer: Self-pay | Admitting: Family Medicine

## 2021-12-05 ENCOUNTER — Ambulatory Visit (INDEPENDENT_AMBULATORY_CARE_PROVIDER_SITE_OTHER): Payer: Managed Care, Other (non HMO) | Admitting: Family Medicine

## 2021-12-05 ENCOUNTER — Other Ambulatory Visit: Payer: Self-pay

## 2021-12-05 DIAGNOSIS — I10 Essential (primary) hypertension: Secondary | ICD-10-CM | POA: Diagnosis not present

## 2021-12-05 DIAGNOSIS — E1140 Type 2 diabetes mellitus with diabetic neuropathy, unspecified: Secondary | ICD-10-CM | POA: Diagnosis not present

## 2021-12-05 DIAGNOSIS — Z794 Long term (current) use of insulin: Secondary | ICD-10-CM

## 2021-12-05 NOTE — Patient Instructions (Signed)
It was good seeing you today.  I am glad your blood sugars are doing better.  Please get the Trulicity from the pharmacy.  Regarding your blood pressure your blood pressure looks great today.  I am sorry you had some low blood pressures when he for started taking the medication but I want to continue with this current dose for now.  If you start having the symptoms that you previously described or have any further falls please let me know and we may need to make some adjustments to the medications.  If you have any questions or concerns call the clinic.  I would like to see you in 1-2 months to see how you are doing, check on your blood pressure as well as your blood sugars.  I am collecting some lab work today to evaluate your calcium and your parathyroid hormone which can cause issues with calcium.  I will send you a MyChart message with the results or call you if there are any abnormalities.  I hope you have a great afternoon! ?

## 2021-12-05 NOTE — Progress Notes (Signed)
? ? ?  SUBJECTIVE:  ? ?CHIEF COMPLAINT / HPI:  ? ?HTN ?Patient reports that since last being seen he has started his Cozaar.  He reports that the first few days of taking the medication he felt like "back when I went into the hospital".  The first time he took the medication he reports it was at night and he got up to get something to drink and he felt weak and jittery and he checked his blood pressure and it was 86/67.  Throughout the day his blood pressures are more normal in the 120s-130s/60s-70s.  He reports that the symptoms have become much less frequent over the past week and he has had no episodes where he felt like he was going to pass out. ? ?Diabetes ?Patient reports that since starting his new diabetes medication his blood pressures have been better controlled with no hypoglycemic events and the highest blood sugar he is seen is 267.  He is currently waiting for a new continuous glucose monitor.  There was an issue with his insurance covering [place] so a prescription for Trulicity has been sent to patient's pharmacy.  He is going to pick it up. ? ?OBJECTIVE:  ? ?BP 121/67   Wt 234 lb 12.8 oz (106.5 kg)   BMI 33.69 kg/m?   ?General: Pleasant 56 year old male, chronically ill ?Cardiac: Regular rate and rhythm, no murmurs appreciated ?Respiratory: Normal work of breathing, lungs clear to auscultation bilaterally ?MSK: Decreased peripheral edema from previous evaluations ? ?ASSESSMENT/PLAN:  ? ?Type 2 diabetes mellitus with diabetic neuropathy, with long-term current use of insulin ([place]) ?Diabetes better controlled at this time.  Blood glucose is more in range with the highest blood sugar being 267.  No hypoglycemic events since transitioning to Trulicity.  We are going to continue on the Trulicity and titrate as needed.  We will also continue metformin XR 500 mg twice daily.  Plan for repeat A1c in May 2023. ? ?Hypocalcemia ?Unclear etiology of the hypocalcemia, likely due to malnutrition.  Will check  ionized calcium today as well as PTH.  Patient may need calcium supplementation. ? ?Essential hypertension ?Patient with initial low blood sugars after taking the Cozaar.  Symptoms have improved and patient not experiencing hypotensive episodes over the last week.  Plan on continuing current medication regimen with Cozaar 50 mg daily.  If symptoms return, patient has any more falls then patient is going to call the clinic and we will make adjustments to the medications.  May need to decrease Cozaar to 25 mg daily.  Plan on following up on his blood pressures in 1 month. ?  ? ? ?[name], MD ?[place]   ?

## 2021-12-05 NOTE — Progress Notes (Deleted)
? ? ?  SUBJECTIVE:  ? ?CHIEF COMPLAINT / HPI:  ? ?*** ? ?PERTINENT  PMH / PSH: *** ? ?OBJECTIVE:  ? ?There were no vitals taken for this visit.  ?*** ? ?ASSESSMENT/PLAN:  ? ?No problem-specific Assessment & Plan notes found for this encounter. ?  ? ? ?[name], MD ?[place]   ?

## 2021-12-06 ENCOUNTER — Telehealth: Payer: Self-pay

## 2021-12-06 LAB — PTH, INTACT AND CALCIUM
Calcium: 6.8 mg/dL — CL (ref 8.7–10.2)
PTH: 85 pg/mL — ABNORMAL HIGH (ref 15–65)

## 2021-12-06 NOTE — Telephone Encounter (Addendum)
Tried to call patient to discuss results.  Based on last INR this calcium would correct to 8.5.  I have placed order for ionized calcium as well as a repeat PTH which can be collected at patient's next visit with pharmacy team.  No need for intervention at this time. ?

## 2021-12-06 NOTE — Assessment & Plan Note (Signed)
Patient with initial low blood sugars after taking the Cozaar.  Symptoms have improved and patient not experiencing hypotensive episodes over the last week.  Plan on continuing current medication regimen with Cozaar 50 mg daily.  If symptoms return, patient has any more falls then patient is going to call the clinic and we will make adjustments to the medications.  May need to decrease Cozaar to 25 mg daily.  Plan on following up on his blood pressures in 1 month. ?

## 2021-12-06 NOTE — Addendum Note (Signed)
Addended by: Concepcion Living on: 12/06/2021 04:36 PM ? ? Modules accepted: Orders ? ?

## 2021-12-06 NOTE — Telephone Encounter (Signed)
[facility] calls nurse line reporting critical lab value for patient.  ? ?Calcium 6.8. ? ?Will forward to provider.  ? ? ? ? ?

## 2021-12-06 NOTE — Assessment & Plan Note (Signed)
Unclear etiology of the hypocalcemia, likely due to malnutrition.  Will check ionized calcium today as well as PTH.  Patient may need calcium supplementation. ?

## 2021-12-06 NOTE — Assessment & Plan Note (Signed)
Diabetes better controlled at this time.  Blood glucose is more in range with the highest blood sugar being 267.  No hypoglycemic events since transitioning to Trulicity.  We are going to continue on the Trulicity and titrate as needed.  We will also continue metformin XR 500 mg twice daily.  Plan for repeat A1c in May 2023. ?

## 2021-12-20 ENCOUNTER — Encounter (HOSPITAL_COMMUNITY): Payer: Self-pay | Admitting: Emergency Medicine

## 2021-12-20 ENCOUNTER — Inpatient Hospital Stay (HOSPITAL_COMMUNITY)
Admission: EM | Admit: 2021-12-20 | Discharge: 2021-12-31 | DRG: 812 | Disposition: A | Discharge status: Home / Self Care | Payer: Commercial Managed Care - HMO | Attending: Family Medicine | Admitting: Family Medicine

## 2021-12-20 ENCOUNTER — Emergency Department (HOSPITAL_COMMUNITY): Payer: Commercial Managed Care - HMO

## 2021-12-20 ENCOUNTER — Ambulatory Visit: Payer: 59 | Admitting: Pharmacist

## 2021-12-20 ENCOUNTER — Other Ambulatory Visit: Payer: Self-pay

## 2021-12-20 DIAGNOSIS — G4733 Obstructive sleep apnea (adult) (pediatric): Secondary | ICD-10-CM | POA: Diagnosis present

## 2021-12-20 DIAGNOSIS — E1122 Type 2 diabetes mellitus with diabetic chronic kidney disease: Secondary | ICD-10-CM | POA: Diagnosis present

## 2021-12-20 DIAGNOSIS — N179 Acute kidney failure, unspecified: Secondary | ICD-10-CM | POA: Diagnosis not present

## 2021-12-20 DIAGNOSIS — R739 Hyperglycemia, unspecified: Principal | ICD-10-CM

## 2021-12-20 DIAGNOSIS — Z79899 Other long term (current) drug therapy: Secondary | ICD-10-CM

## 2021-12-20 DIAGNOSIS — E119 Type 2 diabetes mellitus without complications: Secondary | ICD-10-CM | POA: Diagnosis not present

## 2021-12-20 DIAGNOSIS — K219 Gastro-esophageal reflux disease without esophagitis: Secondary | ICD-10-CM | POA: Diagnosis present

## 2021-12-20 DIAGNOSIS — Z794 Long term (current) use of insulin: Secondary | ICD-10-CM

## 2021-12-20 DIAGNOSIS — E1140 Type 2 diabetes mellitus with diabetic neuropathy, unspecified: Secondary | ICD-10-CM | POA: Diagnosis present

## 2021-12-20 DIAGNOSIS — E8809 Other disorders of plasma-protein metabolism, not elsewhere classified: Secondary | ICD-10-CM | POA: Diagnosis present

## 2021-12-20 DIAGNOSIS — R310 Gross hematuria: Secondary | ICD-10-CM | POA: Diagnosis not present

## 2021-12-20 DIAGNOSIS — R531 Weakness: Secondary | ICD-10-CM

## 2021-12-20 DIAGNOSIS — E11649 Type 2 diabetes mellitus with hypoglycemia without coma: Secondary | ICD-10-CM | POA: Diagnosis not present

## 2021-12-20 DIAGNOSIS — Z7984 Long term (current) use of oral hypoglycemic drugs: Secondary | ICD-10-CM

## 2021-12-20 DIAGNOSIS — Z888 Allergy status to other drugs, medicaments and biological substances status: Secondary | ICD-10-CM

## 2021-12-20 DIAGNOSIS — D509 Iron deficiency anemia, unspecified: Secondary | ICD-10-CM | POA: Diagnosis not present

## 2021-12-20 DIAGNOSIS — I129 Hypertensive chronic kidney disease with stage 1 through stage 4 chronic kidney disease, or unspecified chronic kidney disease: Secondary | ICD-10-CM | POA: Diagnosis present

## 2021-12-20 DIAGNOSIS — K5730 Diverticulosis of large intestine without perforation or abscess without bleeding: Secondary | ICD-10-CM | POA: Diagnosis present

## 2021-12-20 DIAGNOSIS — K7030 Alcoholic cirrhosis of liver without ascites: Secondary | ICD-10-CM | POA: Diagnosis present

## 2021-12-20 DIAGNOSIS — Z20822 Contact with and (suspected) exposure to covid-19: Secondary | ICD-10-CM | POA: Diagnosis present

## 2021-12-20 DIAGNOSIS — Z961 Presence of intraocular lens: Secondary | ICD-10-CM | POA: Diagnosis present

## 2021-12-20 DIAGNOSIS — R296 Repeated falls: Secondary | ICD-10-CM | POA: Diagnosis present

## 2021-12-20 DIAGNOSIS — E7800 Pure hypercholesterolemia, unspecified: Secondary | ICD-10-CM | POA: Diagnosis present

## 2021-12-20 DIAGNOSIS — D472 Monoclonal gammopathy: Secondary | ICD-10-CM | POA: Diagnosis present

## 2021-12-20 DIAGNOSIS — F32A Depression, unspecified: Secondary | ICD-10-CM | POA: Diagnosis present

## 2021-12-20 DIAGNOSIS — R55 Syncope and collapse: Secondary | ICD-10-CM | POA: Diagnosis present

## 2021-12-20 DIAGNOSIS — D649 Anemia, unspecified: Secondary | ICD-10-CM | POA: Diagnosis not present

## 2021-12-20 DIAGNOSIS — Z720 Tobacco use: Secondary | ICD-10-CM

## 2021-12-20 DIAGNOSIS — G40909 Epilepsy, unspecified, not intractable, without status epilepticus: Secondary | ICD-10-CM | POA: Diagnosis present

## 2021-12-20 DIAGNOSIS — W19XXXA Unspecified fall, initial encounter: Secondary | ICD-10-CM | POA: Diagnosis present

## 2021-12-20 DIAGNOSIS — Z8674 Personal history of sudden cardiac arrest: Secondary | ICD-10-CM

## 2021-12-20 DIAGNOSIS — Z7985 Long-term (current) use of injectable non-insulin antidiabetic drugs: Secondary | ICD-10-CM

## 2021-12-20 DIAGNOSIS — F418 Other specified anxiety disorders: Secondary | ICD-10-CM | POA: Diagnosis not present

## 2021-12-20 DIAGNOSIS — E860 Dehydration: Secondary | ICD-10-CM | POA: Diagnosis present

## 2021-12-20 DIAGNOSIS — Z833 Family history of diabetes mellitus: Secondary | ICD-10-CM

## 2021-12-20 DIAGNOSIS — I872 Venous insufficiency (chronic) (peripheral): Secondary | ICD-10-CM | POA: Diagnosis present

## 2021-12-20 DIAGNOSIS — N1831 Chronic kidney disease, stage 3a: Secondary | ICD-10-CM | POA: Diagnosis present

## 2021-12-20 DIAGNOSIS — Z7982 Long term (current) use of aspirin: Secondary | ICD-10-CM

## 2021-12-20 DIAGNOSIS — E1165 Type 2 diabetes mellitus with hyperglycemia: Secondary | ICD-10-CM | POA: Diagnosis present

## 2021-12-20 DIAGNOSIS — K5790 Diverticulosis of intestine, part unspecified, without perforation or abscess without bleeding: Secondary | ICD-10-CM | POA: Diagnosis not present

## 2021-12-20 DIAGNOSIS — A0472 Enterocolitis due to Clostridium difficile, not specified as recurrent: Secondary | ICD-10-CM | POA: Diagnosis not present

## 2021-12-20 DIAGNOSIS — I951 Orthostatic hypotension: Secondary | ICD-10-CM | POA: Diagnosis present

## 2021-12-20 DIAGNOSIS — R197 Diarrhea, unspecified: Secondary | ICD-10-CM | POA: Diagnosis not present

## 2021-12-20 DIAGNOSIS — D500 Iron deficiency anemia secondary to blood loss (chronic): Secondary | ICD-10-CM

## 2021-12-20 DIAGNOSIS — D631 Anemia in chronic kidney disease: Secondary | ICD-10-CM | POA: Diagnosis present

## 2021-12-20 DIAGNOSIS — R9431 Abnormal electrocardiogram [ECG] [EKG]: Secondary | ICD-10-CM

## 2021-12-20 DIAGNOSIS — I10 Essential (primary) hypertension: Secondary | ICD-10-CM | POA: Diagnosis not present

## 2021-12-20 LAB — COMPREHENSIVE METABOLIC PANEL
ALT: 42 U/L (ref 0–44)
AST: 44 U/L — ABNORMAL HIGH (ref 15–41)
Albumin: <1.5 g/dL — ABNORMAL LOW (ref 3.5–5.0)
Alkaline Phosphatase: 237 U/L — ABNORMAL HIGH (ref 38–126)
Anion gap: 12 (ref 5–15)
BUN: 18 mg/dL (ref 6–20)
CO2: 28 mmol/L (ref 22–32)
Calcium: 7.1 mg/dL — ABNORMAL LOW (ref 8.9–10.3)
Chloride: 97 mmol/L — ABNORMAL LOW (ref 98–111)
Creatinine, Ser: 1.83 mg/dL — ABNORMAL HIGH (ref 0.61–1.24)
GFR, Estimated: 43 mL/min — ABNORMAL LOW (ref >60)
Glucose, Bld: 335 mg/dL — ABNORMAL HIGH (ref 70–99)
Potassium: 4.8 mmol/L (ref 3.5–5.1)
Sodium: 137 mmol/L (ref 135–145)
Total Bilirubin: 0.5 mg/dL (ref 0.3–1.2)
Total Protein: 5.7 g/dL — ABNORMAL LOW (ref 6.5–8.1)

## 2021-12-20 LAB — I-STAT VENOUS BLOOD GAS, ED
Acid-Base Excess: 7 mmol/L — ABNORMAL HIGH (ref 0.0–2.0)
Bicarbonate: 31.5 mmol/L — ABNORMAL HIGH (ref 20.0–28.0)
Calcium, Ion: 0.95 mmol/L — ABNORMAL LOW (ref 1.15–1.40)
HCT: 27 % — ABNORMAL LOW (ref 39.0–52.0)
Hemoglobin: 9.2 g/dL — ABNORMAL LOW (ref 13.0–17.0)
O2 Saturation: 75 %
Potassium: 4.7 mmol/L (ref 3.5–5.1)
Sodium: 137 mmol/L (ref 135–145)
TCO2: 33 mmol/L — ABNORMAL HIGH (ref 22–32)
pCO2, Ven: 42.8 mmHg — ABNORMAL LOW (ref 44–60)
pH, Ven: 7.474 — ABNORMAL HIGH (ref 7.25–7.43)
pO2, Ven: 37 mmHg (ref 32–45)

## 2021-12-20 LAB — CBC WITH DIFFERENTIAL/PLATELET
Abs Immature Granulocytes: 0 10*3/uL (ref 0.00–0.07)
Basophils Absolute: 0 10*3/uL (ref 0.0–0.1)
Basophils Relative: 0 %
Eosinophils Absolute: 0 10*3/uL (ref 0.0–0.5)
Eosinophils Relative: 1 %
HCT: 23.5 % — ABNORMAL LOW (ref 39.0–52.0)
Hemoglobin: 7.6 g/dL — ABNORMAL LOW (ref 13.0–17.0)
Lymphocytes Relative: 10 %
Lymphs Abs: 0.5 10*3/uL — ABNORMAL LOW (ref 0.7–4.0)
MCH: 23.7 pg — ABNORMAL LOW (ref 26.0–34.0)
MCHC: 32.3 g/dL (ref 30.0–36.0)
MCV: 73.2 fL — ABNORMAL LOW (ref 80.0–100.0)
Monocytes Absolute: 0.3 10*3/uL (ref 0.1–1.0)
Monocytes Relative: 7 %
Neutro Abs: 3.9 10*3/uL (ref 1.7–7.7)
Neutrophils Relative %: 82 %
Platelets: 344 10*3/uL (ref 150–400)
RBC: 3.21 MIL/uL — ABNORMAL LOW (ref 4.22–5.81)
RDW: 27.2 % — ABNORMAL HIGH (ref 11.5–15.5)
WBC: 4.7 10*3/uL (ref 4.0–10.5)
nRBC: 0 % (ref 0.0–0.2)
nRBC: 0 /100 WBC

## 2021-12-20 LAB — RESP PANEL BY RT-PCR (FLU A&B, COVID) ARPGX2
Influenza A by PCR: NEGATIVE
Influenza B by PCR: NEGATIVE
SARS Coronavirus 2 by RT PCR: NEGATIVE

## 2021-12-20 LAB — TROPONIN I (HIGH SENSITIVITY)
Troponin I (High Sensitivity): 17 ng/L (ref <18)
Troponin I (High Sensitivity): 15 ng/L (ref <18)

## 2021-12-20 LAB — CBG MONITORING, ED
Glucose-Capillary: 302 mg/dL — ABNORMAL HIGH (ref 70–99)
Glucose-Capillary: 273 mg/dL — ABNORMAL HIGH (ref 70–99)
Glucose-Capillary: 181 mg/dL — ABNORMAL HIGH (ref 70–99)

## 2021-12-20 LAB — ETHANOL: Alcohol, Ethyl (B): <10 mg/dL (ref <10)

## 2021-12-20 MED ORDER — LEVETIRACETAM 500 MG PO TABS
1000.0000 mg | ORAL_TABLET | Freq: Two times a day (BID) | ORAL | Status: DC
  Administered 2021-12-20 – 2021-12-31 (×22): 1000 mg via ORAL
  Filled 2021-12-20 (×22): qty 2

## 2021-12-20 MED ORDER — PANTOPRAZOLE SODIUM 40 MG PO TBEC
40.0000 mg | DELAYED_RELEASE_TABLET | Freq: Every day | ORAL | Status: DC
  Administered 2021-12-20 – 2021-12-30 (×11): 40 mg via ORAL
  Filled 2021-12-20 (×12): qty 1

## 2021-12-20 MED ORDER — SODIUM CHLORIDE 0.9 % IV BOLUS
500.0000 mL | Freq: Once | INTRAVENOUS | Status: AC
Start: 2021-12-20 — End: 2021-12-20
  Administered 2021-12-20: 500 mL via INTRAVENOUS

## 2021-12-20 MED ORDER — POLYETHYLENE GLYCOL 3350 17 G PO PACK: 17.0000 g | PACK | Freq: Every day | ORAL | Status: DC | PRN

## 2021-12-20 MED ORDER — GABAPENTIN 300 MG PO CAPS
300.0000 mg | ORAL_CAPSULE | Freq: Once | ORAL | Status: AC
  Administered 2021-12-20: 300 mg via ORAL
  Filled 2021-12-20: qty 1

## 2021-12-20 MED ORDER — INSULIN ASPART 100 UNIT/ML IJ SOLN
10.0000 [IU] | Freq: Once | INTRAMUSCULAR | Status: AC
  Administered 2021-12-20: 10 [IU] via SUBCUTANEOUS

## 2021-12-20 MED ORDER — ENOXAPARIN SODIUM 40 MG/0.4ML IJ SOSY
40.0000 mg | PREFILLED_SYRINGE | INTRAMUSCULAR | Status: DC
  Administered 2021-12-20 – 2021-12-21 (×2): 40 mg via SUBCUTANEOUS
  Filled 2021-12-20 (×2): qty 0.4

## 2021-12-20 MED ORDER — THIAMINE HCL 100 MG PO TABS
100.0000 mg | ORAL_TABLET | Freq: Every day | ORAL | Status: DC
  Administered 2021-12-20 – 2021-12-30 (×11): 100 mg via ORAL
  Filled 2021-12-20 (×12): qty 1

## 2021-12-20 MED ORDER — DULOXETINE HCL 30 MG PO CPEP
30.0000 mg | ORAL_CAPSULE | Freq: Every day | ORAL | Status: DC
  Administered 2021-12-20 – 2021-12-30 (×11): 30 mg via ORAL
  Filled 2021-12-20 (×13): qty 1

## 2021-12-20 MED ORDER — GABAPENTIN 300 MG PO CAPS
900.0000 mg | ORAL_CAPSULE | Freq: Three times a day (TID) | ORAL | Status: DC
  Administered 2021-12-21 – 2021-12-30 (×27): 900 mg via ORAL
  Filled 2021-12-20 (×28): qty 3

## 2021-12-20 MED ORDER — LOSARTAN POTASSIUM 50 MG PO TABS
50.0000 mg | ORAL_TABLET | Freq: Every day | ORAL | Status: DC
  Administered 2021-12-20: 50 mg via ORAL
  Filled 2021-12-20: qty 1

## 2021-12-20 MED ORDER — ASPIRIN EC 81 MG PO TBEC
81.0000 mg | DELAYED_RELEASE_TABLET | Freq: Every day | ORAL | Status: DC
  Administered 2021-12-20 – 2021-12-27 (×8): 81 mg via ORAL
  Filled 2021-12-20 (×8): qty 1

## 2021-12-20 MED ORDER — FUROSEMIDE 20 MG PO TABS
40.0000 mg | ORAL_TABLET | Freq: Two times a day (BID) | ORAL | Status: DC
  Administered 2021-12-20: 40 mg via ORAL
  Filled 2021-12-20: qty 2

## 2021-12-20 MED ORDER — INSULIN ASPART 100 UNIT/ML IJ SOLN: 0.0000 [IU] | Freq: Three times a day (TID) | INTRAMUSCULAR | Status: DC

## 2021-12-20 MED ORDER — CALCIUM GLUCONATE-NACL 2-0.675 GM/100ML-% IV SOLN
2.0000 g | Freq: Once | INTRAVENOUS | Status: AC
  Administered 2021-12-20: 2000 mg via INTRAVENOUS
  Filled 2021-12-20: qty 100

## 2021-12-20 MED ORDER — FOLIC ACID 1 MG PO TABS
1.0000 mg | ORAL_TABLET | Freq: Every day | ORAL | Status: DC
  Administered 2021-12-20 – 2021-12-30 (×11): 1 mg via ORAL
  Filled 2021-12-20 (×12): qty 1

## 2021-12-20 NOTE — ED Provider Notes (Signed)
?[facility] ?Provider Note ? ? ?CSN: [id number] ?Arrival date & time: 12/20/21  1542 ? ?  ? ?History ? ?Chief Complaint  ?Patient presents with  ? Dizziness  ? ? ?Jeffrey [name] is a 56 y.o. male. ? ?Pt is a 56 yo male with pmh of HTN, hyperlipidemia, MI, CKD, cardiac arrest, DM, and GERD presenting for dizziness. Patient admits to dizziness described as light headedness that is exacerbated by standing. Denies any chest pain or sob. Denies recent illness, fever, chill, coughing, nausea, vomiting, or diarrhea. Denies bleeding, black, or bloody stools. Pt attributes dizziness to taking Losartan. States he took it last year and was stopped due to postural dizziness. States he was started back on it 2-3 weeks ago when symptoms began. Admits to 3 falls in the last 2-3 weeks since starting. Most recent fall was this morning. Denies head trauma, spinal pain, or hx of blood thinner use.  ? ?The history is provided by the patient. No language interpreter was used.  ?Dizziness ?Associated symptoms: no chest pain, no palpitations, no shortness of breath and no vomiting   ? ?  ? ?Home Medications ?Prior to Admission medications   ?Medication Sig Start Date End Date Taking? Authorizing Provider  ?aspirin EC 81 MG tablet Take 1 tablet (81 mg total) by mouth at bedtime. 06/24/19   Zenia Resides, MD  ?blood glucose meter kit and supplies Dispense based on patient and insurance preference. Use up to four times daily as directed. (FOR ICD-10 E10.9, E11.9). dispense 90 test strips and 90 lancets. 09/17/21   Samuella Cota, MD  ?Continuous Blood Gluc Sensor (FREESTYLE LIBRE 2 SENSOR) MISC Use as directed 10/02/21   [name], MD  ?Dulaglutide (TRULICITY) 5.46 TK/3.5WS SOPN Inject 0.75 mg into the skin once a week. 11/29/21   Leavy Cella, RPH-CPP  ?DULoxetine (CYMBALTA) 30 MG capsule Take 1 capsule (30 mg total) by mouth daily. 11/22/21   Zenia Resides, MD  ?Ensure Max Protein  (ENSURE MAX PROTEIN) LIQD Take 330 mLs (11 oz total) by mouth 3 (three) times daily between meals. 08/10/21   Holley Bouche, MD  ?folic acid (FOLVITE) 1 MG tablet Take 1 tablet by mouth once daily 10/02/21   [name], MD  ?furosemide (LASIX) 40 MG tablet Take 1 tablet by mouth once daily 10/02/21   [name], MD  ?gabapentin (NEURONTIN) 300 MG capsule TAKE 3 CAPSULES BY MOUTH THREE TIMES DAILY 11/05/21   [name], MD  ?levETIRAcetam (KEPPRA) 1000 MG tablet Take 1 tablet (1,000 mg total) by mouth 2 (two) times daily. 09/17/21   Samuella Cota, MD  ?losartan (COZAAR) 50 MG tablet Take 1 tablet (50 mg total) by mouth at bedtime. 11/19/21   [name], MD  ?metFORMIN (GLUCOPHAGE-XR) 500 MG 24 hr tablet TAKE 1 TABLET BY MOUTH TWICE DAILY WITH A MEAL 12/04/21   [name], MD  ?mirtazapine (REMERON) 30 MG tablet Take 1 tablet (30 mg total) by mouth at bedtime. 10/02/21   [name], MD  ?Multiple Vitamin (MULTIVITAMIN WITH MINERALS) TABS tablet Take 1 tablet by mouth daily. 08/11/21   Holley Bouche, MD  ?pantoprazole (PROTONIX) 40 MG tablet Take 1 tablet (40 mg total) by mouth 2 (two) times daily. 09/17/21 11/22/21  Samuella Cota, MD  ?thiamine (VITAMIN B-1) 100 MG tablet Take 1 tablet (100 mg total) by mouth daily. 05/27/16   [name], DO  ?   ? ?Allergies    ?Atorvastatin,  Drug ingredient [spironolactone], and Lisinopril   ? ?Review of Systems   ?Review of Systems  ?Constitutional:  Negative for chills and fever.  ?HENT:  Negative for ear pain and sore throat.   ?Eyes:  Negative for pain and visual disturbance.  ?Respiratory:  Negative for cough and shortness of breath.   ?Cardiovascular:  Negative for chest pain and palpitations.  ?Gastrointestinal:  Negative for abdominal pain and vomiting.  ?Genitourinary:  Negative for dysuria and hematuria.  ?Musculoskeletal:  Negative for arthralgias and back pain.  ?Skin:  Negative for color change and rash.   ?Neurological:  Positive for dizziness and light-headedness. Negative for seizures and syncope.  ?All other systems reviewed and are negative. ? ?Physical Exam ?Updated Vital Signs ?BP (!) 140/109   Pulse (!) 109   Temp 98.5 ?F (36.9 ?C) (Oral)   Resp 13   Ht $R'5\' 10"'OF$  (1.778 m)   Wt 102.1 kg   SpO2 99%   BMI 32.28 kg/m?  ?Physical Exam ?Vitals and nursing note reviewed.  ?Constitutional:   ?   General: He is not in acute distress. ?   Appearance: He is well-developed.  ?HENT:  ?   Head: Normocephalic and atraumatic.  ?Eyes:  ?   Conjunctiva/sclera: Conjunctivae normal.  ?Cardiovascular:  ?   Rate and Rhythm: Normal rate and regular rhythm.  ?   Heart sounds: No murmur heard. ?Pulmonary:  ?   Effort: Pulmonary effort is normal. No respiratory distress.  ?   Breath sounds: Normal breath sounds.  ?Abdominal:  ?   Palpations: Abdomen is soft.  ?   Tenderness: There is no abdominal tenderness.  ?Musculoskeletal:     ?   General: No swelling.  ?   Cervical back: Neck supple.  ?Skin: ?   General: Skin is warm and dry.  ?   Capillary Refill: Capillary refill takes less than 2 seconds.  ?Neurological:  ?   Mental Status: He is alert.  ?Psychiatric:     ?   Mood and Affect: Mood normal.  ? ? ?ED Results / Procedures / Treatments   ?Labs ?(all labs ordered are listed, but only abnormal results are displayed) ?Labs Reviewed  ?COMPREHENSIVE METABOLIC PANEL - Abnormal; Notable for the following components:  ?    Result Value  ? Chloride 97 (*)   ? Glucose, Bld 335 (*)   ? Creatinine, Ser 1.83 (*)   ? Calcium 7.1 (*)   ? Total Protein 5.7 (*)   ? Albumin <1.5 (*)   ? AST 44 (*)   ? Alkaline Phosphatase 237 (*)   ? GFR, Estimated 43 (*)   ? All other components within normal limits  ?CBC WITH DIFFERENTIAL/PLATELET - Abnormal; Notable for the following components:  ? RBC 3.21 (*)   ? Hemoglobin 7.6 (*)   ? HCT 23.5 (*)   ? MCV 73.2 (*)   ? MCH 23.7 (*)   ? RDW 27.2 (*)   ? Lymphs Abs 0.5 (*)   ? All other components within  normal limits  ?CBG MONITORING, ED - Abnormal; Notable for the following components:  ? Glucose-Capillary 302 (*)   ? All other components within normal limits  ?I-STAT VENOUS BLOOD GAS, ED - Abnormal; Notable for the following components:  ? pH, Ven 7.474 (*)   ? pCO2, Ven 42.8 (*)   ? Bicarbonate 31.5 (*)   ? TCO2 33 (*)   ? Acid-Base Excess 7.0 (*)   ? Calcium, Ion 0.95 (*)   ?  HCT 27.0 (*)   ? Hemoglobin 9.2 (*)   ? All other components within normal limits  ?RESP PANEL BY RT-PCR (FLU A&B, COVID) [id number]  ?URINALYSIS, ROUTINE W REFLEX MICROSCOPIC  ?TROPONIN I (HIGH SENSITIVITY)  ? ? ?EKG ?EKG Interpretation ? ?Date/Time:  Thursday December 20 2021 15:53:33 EDT ?Ventricular Rate:  120 ?PR Interval:  136 ?QRS Duration: 99 ?QT Interval:  354 ?QTC Calculation: 501 ?R Axis:   71 ?Text Interpretation: Sinus tachycardia Low voltage, extremity leads Technically poor tracing Confirmed by Campbell Stall ([id number]) on 4/47/3958 4:47:12 PM ? ?Radiology ?No results found. ? ?Procedures ?Procedures  ? ? ?Medications Ordered in ED ?Medications - No data to display ? ?ED Course/ Medical Decision Making/ A&P ?  ?                        ?Medical Decision Making ?Amount and/or Complexity of Data Reviewed ?Labs: ordered. ?Radiology: ordered. ? ?Risk ?Prescription drug management. ? ? ?4:52 PM ?56 yo male with pmh of HTN, hyperlipidemia, MI, CKD, cardiac arrest, DM, and GERD presenting for dizziness. Pt is Aox3, no acute distress, afebrile, with stable vitals.  ? ?EKG demonstrates prolonged QTC. No st segment elevation or depression. Troponin elevated but consistent with baseline. No acitve chest pain or sob. Hypocalcemia present with EKG changes. Calcium given. Glucose elevated. Pt on insulin at home. No acidosis. No dka. 10 units subQ insulin given in ED. Creatine elevated above baseline.  ? ?Pt BP increasing to 184/123. Nightly BP meds ordered.  ? ?Recommending admission for electrolyte abnormalities including hypocalcemia with EKG changes  of prolonged QTC. Pt agreeable to plan. I spoke with admitting physician Dr. Ronnald Ramp who agrees to accept pt.  ? ? ? ? ? ? ? ?Final Clinical Impression(s) / ED Diagnoses ?Final diagnoses:  ?Hyperglycemia  ?AKI (acute kidney inj

## 2021-12-20 NOTE — ED Notes (Signed)
Transport put in for patient  ?

## 2021-12-20 NOTE — H&P (Addendum)
Family Medicine Teaching Service ?Hospital Admission History and Physical ?Service Pager: [phone] ? ?Patient name: Jeffrey [name] Medical record number: [id number] ?Date of birth: 06-Jul-1966 Age: 56 y.o. Gender: male ? ?Primary Care Provider: [name], MD ?Consultants: none ?Code Status: Full ?Preferred Emergency Contact: Extended Emergency Contact Information ?Primary Emergency Contact: Terrell,Charles ?Mobile Phone: [phone] ?Relation: Friend  ? ?Chief Complaint: Dizziness and falls ? ?Assessment and Plan: ?Jeffrey [name] is a 56 y.o. male presenting with dizziness and falls for past few weeks. PMH is significant for HTN, HLD, CKD, MI, cardiac arrest, anoxic brain injury, T2DM, GERD, cirrhosis, Depression, anemia of chronic disease ? ?Pre-syncope  falls  ?Pt presents after 2-3 weeks of pre syncope and falls. In ED BP is elevated 140-196/108-137, tachycardic in low 100's, afebrile and RR WNL. Labs show glu 302 with pH 7.47, bicarb 28, AG 12 therefore not in DKA. He was given 10 U novolog. Ionized ca of 0.95 which was repleted with calcium gluconate 2g IV in ED. Hgb 7.6, WBC WNL, Cr elevated 1.83, alk phos 237. CRX unremarkable. ECG shows sinus tach of 120 bpm and prolonged QTc to 501 and trop 17>15.  ?Symptoms less likely d/t cardiac etiology as ECG shows no evidence of MI or arhythmia and trops are WNL and flat. Infectious etiology unlikely as pt is afebrile, WBC WNL, CXR with no signs of PNA. Pt thinks recently starting back on losartan is what led to his symptoms however his blood pressure has remained elevated since arrival and he has been taking his losartan daily so this is unlikely. In February pt  had noted some hypoglycemic events and was taken off of his insulin and placed on Trulicity 3.14 mg weekly.  Can consider hypoglycemia as a cause of his symptoms although his glucose is elevated and he states his blood sugars have been running high lately. Will CTM CBGs. His Hgb was 7.6 with repeat  of 9.2 which is around his baseline and pt has hx of anemia of chronic disease. Can consider anemia as etiology and will monitor. Pt has hx of alcohol abuse, states he has not drank in 3-4 months. Will check ETOH level.  He did state he has not had a great appetite for years and does not eat much, albumin is less than 1.5 and patient is likely malnourished.  He does have an AKI and could be dehydrated. Will give fluid bolus and encourage PO intake.  He has a history of muscle weakness, balance problems and abnormalities of his gait/mobility and previously went to outpatient rehab. The cause of his symptoms is likely multifactorial with dehydration, malnourishment, deconditioning and possibly hypoglycemia contributing. ?-admit to med tele, attending Dr. Ardelia Mems ?-AM BMP ?-AM CBC ?-F/u ETOH level ?-continuous cardiac monitoring ?-vitals per floor routine ?-Monitor CBGs ?-500 mL fluid bolus  ?-Strict I/Os ?-f/u UA ?-PT/OT eval and treat ? ?Hypocalcemia ?Patient has chronic hypocalcemia of unknown etiology.  Corrected calcium was WNL Ionized calcium today of 0.95.   PTH has been ordered.  Patient does complain of increased neuropathic pain in hands and feet and increased fatigue/weakness which could be symptomatic hypocalcemia. His QTc was prolonged at 501 which can also be caused by hypocalcemia. Patient received 2 g calcium gluconate IV.  ?-Follow-up PTH ?-Follow-up ionized calcium in a.m. ?-AM EKG ? ?HTN ?Home medications include lasix 40 mg daily (which pt states he takes although prescription has not been filled since December), and Cozaar 50 mg daily. BP in ED elevated to 140-196/108-137. ED ordered  his home dose of Cozaar and we ordered his home lasix. Pt unfortunately has an AKI so both of these medications have been discontinued. Will continue to monitor BP and consider adding metoprolol if needed.  ?-Monitor BP ?-holding home medications in setting of AKI ? ?AKI on CKD  ?Cr today of 1.83, baseline appears to  be 1.1.  Could be due to dehydration.  Patient was given a 500 mL NS bolus.  He was also given his home dose of Lasix 40 mg and Cozaar 50 mg which have been discontinued. ?-AM BMP ?-Strict I/Os ?-Avoid nephrotoxic medications ? ?T2DM  neuropathy ?Home medications include Trulicity 7.59 mg weekly and metformin 500 mg twice daily.  Patient takes gabapentin 300 mg 3 times daily for neuropathy. Last hemoglobin A1c on 07/31/2021 at 5.7.  Holding home medications and will treat with sSSI and monitor CBGs.  ?-sSSI ?-CBGs with meals and at bedtime ?-Continue home gabapentin ? ?Microcytic Anemia with hx of anemia of chronic disease ?Hgb of 7.6 on CBC with MCV of 73.2. Repeat Hgb on I-Stat of 9.2. Baseline Hgb around 9-10. There is concern that pt's transfusion threshold is 8 but pt refuses blood transfusion. Cannot find hx of colonoscopy. Will continue to Monitor.  ?-AM CBC ? ?Seizure disorder ?-Continue home Keppra 1,000 mg BID ? ?Depression ?Patient takes Cymbalta and Remeron 30 mg daily ?-Continue home Cymbalta 30 mg daily ?-Holding Remeron due to QTc prolongation ? ?HLD ?Last LDL at goal of 58 on 02/27/21.  Does not tolerate statins. ? ?GERD ?-Continue home Protonix 40 mg daily ? ?History of alcohol use disorder ?Patient states he has not had a drink in 3 to 4 months ?-Continue thiamine 100 mg daily ?-Continue folate 1 mg daily ? ?Hx of MI several years ago ?-cont. bASA ? ?FEN/GI: Regular diet ?Prophylaxis: Lovenox ? ?Disposition: Med-tele ? ?History of Present Illness:  Jeffrey [name] is a 56 y.o. male presenting with pre syncope and falls ? ?Review Of Systems: Per HPI with the following additions:  ? ?Dizzy for past couple weeks since starting back on losartan. Has still been taking it every day, took it last night. Feels anxious, nervous, unsteady, light headed when and has falls, has happened 4 times, did not hit head, no injuries and denies LOC. Only eats fruit cocktail, cottage cheese and a sandwich each day,  sugars have been "high". Recently stopped Lantus d/t low sugars,  he is taking trulicity. Has increased neuropathic pain in hands and feet. Has been dry coughing over past 3 weeks and has coughing fits. No BM for 2 days wants miralax. Last seizure about a year ago.   ? ?No tobacco ?No alcohol for past 3-4 months ?No recreational drugs ? ?Review of Systems  ?Constitutional:  Positive for fatigue. Negative for appetite change and fever.  ?HENT:  Positive for tinnitus. Negative for congestion, rhinorrhea and sore throat.   ?Eyes:  Positive for visual disturbance.  ?Respiratory:  Positive for cough. Negative for shortness of breath.   ?Cardiovascular:  Positive for palpitations and leg swelling. Negative for chest pain.  ?Gastrointestinal:  Positive for constipation. Negative for diarrhea, nausea and vomiting.  ?Genitourinary:  Negative for difficulty urinating.  ?Neurological:  Positive for light-headedness.  - "floaters" ? ?Patient Active Problem List  ? Diagnosis Date Noted  ? Hypocalcemia 12/06/2021  ? Demand ischemia ([place]) 09/16/2021  ? Hypothermia 09/13/2021  ? Hypoalbuminemia   ? Recurrent falls 07/29/2021  ? Grief counseling 01/04/2021  ? Incontinence 12/23/2020  ?  Hospital discharge follow-up 11/24/2020  ? Syncope 11/17/2020  ? Hypomagnesemia 11/05/2020  ? Hematemesis with nausea   ? Alcohol abuse with intoxication ([place])   ? Duodenal ulcer hemorrhagic   ? GI bleed 10/31/2020  ? Hyperglycemia 10/31/2020  ? DKA (diabetic ketoacidosis) ([place]) 07/22/2020  ? Candidiasis 07/22/2020  ? Hyponatremia 06/28/2020  ? Eyebrow laceration, left, initial encounter   ? Hypokalemia   ? Hyperlipidemia associated with type 2 diabetes mellitus ([place])   ? MGUS (monoclonal gammopathy of unknown significance) 05/31/2020  ? Seizure disorder ([place]) 03/16/2020  ? Type 2 diabetes mellitus with hyperglycemia, with long-term current use of insulin ([place]) 03/16/2020  ? Tremor 03/16/2020  ? Colon cancer screening 02/22/2020  ? Stage 3a chronic  kidney disease ([place]) 01/29/2020  ? Hyperphosphatemia 01/29/2020  ? Major depressive disorder, recurrent episode ([place]) 01/13/2018  ? Weakness   ? Sepsis secondary to UTI [facility]   ? Hypotension 07/27/2017  ? S

## 2021-12-20 NOTE — ED Triage Notes (Signed)
Pt bib [facility] from home for dizziness for approx 2 weeks. Pt was taken off losartan for dizziness over a year ago but was recently put back on it and has been experiencing dizziness every since. Pt does report 3 falls since taking the medication. Denies trauma/injury after falls. No thinners. CBG 501 with ems - pt reports not taking insulin today. 250 NS given PTA.  ? ?BP 138/84  ?HR 104 ?Spo2: 99% RA ?

## 2021-12-21 DIAGNOSIS — R739 Hyperglycemia, unspecified: Secondary | ICD-10-CM

## 2021-12-21 DIAGNOSIS — N179 Acute kidney failure, unspecified: Secondary | ICD-10-CM

## 2021-12-21 DIAGNOSIS — R55 Syncope and collapse: Secondary | ICD-10-CM

## 2021-12-21 DIAGNOSIS — E8809 Other disorders of plasma-protein metabolism, not elsewhere classified: Secondary | ICD-10-CM | POA: Diagnosis not present

## 2021-12-21 DIAGNOSIS — D649 Anemia, unspecified: Secondary | ICD-10-CM

## 2021-12-21 LAB — CBC
HCT: 22.9 % — ABNORMAL LOW (ref 39.0–52.0)
Hemoglobin: 7.8 g/dL — ABNORMAL LOW (ref 13.0–17.0)
MCH: 24.7 pg — ABNORMAL LOW (ref 26.0–34.0)
MCHC: 34.1 g/dL (ref 30.0–36.0)
MCV: 72.5 fL — ABNORMAL LOW (ref 80.0–100.0)
Platelets: 325 10*3/uL (ref 150–400)
RBC: 3.16 MIL/uL — ABNORMAL LOW (ref 4.22–5.81)
RDW: 27.4 % — ABNORMAL HIGH (ref 11.5–15.5)
WBC: 7 10*3/uL (ref 4.0–10.5)
nRBC: 0 % (ref 0.0–0.2)

## 2021-12-21 LAB — BASIC METABOLIC PANEL
Anion gap: 11 (ref 5–15)
BUN: 18 mg/dL (ref 6–20)
CO2: 32 mmol/L (ref 22–32)
Calcium: 7.7 mg/dL — ABNORMAL LOW (ref 8.9–10.3)
Chloride: 99 mmol/L (ref 98–111)
Creatinine, Ser: 1.63 mg/dL — ABNORMAL HIGH (ref 0.61–1.24)
GFR, Estimated: 49 mL/min — ABNORMAL LOW (ref >60)
Glucose, Bld: 28 mg/dL — CL (ref 70–99)
Potassium: 4.4 mmol/L (ref 3.5–5.1)
Sodium: 142 mmol/L (ref 135–145)

## 2021-12-21 LAB — FERRITIN: Ferritin: 46 ng/mL (ref 24–336)

## 2021-12-21 LAB — URINALYSIS, ROUTINE W REFLEX MICROSCOPIC
Bilirubin Urine: NEGATIVE
Glucose, UA: 50 mg/dL — AB
Ketones, ur: NEGATIVE mg/dL
Leukocytes,Ua: NEGATIVE
Nitrite: NEGATIVE
Protein, ur: 100 mg/dL — AB
RBC / HPF: >50 RBC/hpf — ABNORMAL HIGH (ref 0–5)
Specific Gravity, Urine: 1.013 (ref 1.005–1.030)
pH: 5 (ref 5.0–8.0)

## 2021-12-21 LAB — GLUCOSE, CAPILLARY
Glucose-Capillary: 22 mg/dL — CL (ref 70–99)
Glucose-Capillary: 221 mg/dL — ABNORMAL HIGH (ref 70–99)
Glucose-Capillary: 108 mg/dL — ABNORMAL HIGH (ref 70–99)
Glucose-Capillary: 124 mg/dL — ABNORMAL HIGH (ref 70–99)
Glucose-Capillary: 171 mg/dL — ABNORMAL HIGH (ref 70–99)
Glucose-Capillary: 309 mg/dL — ABNORMAL HIGH (ref 70–99)
Glucose-Capillary: 414 mg/dL — ABNORMAL HIGH (ref 70–99)

## 2021-12-21 LAB — IRON AND TIBC
Iron: 85 ug/dL (ref 45–182)
Saturation Ratios: 68 % — ABNORMAL HIGH (ref 17.9–39.5)
TIBC: 125 ug/dL — ABNORMAL LOW (ref 250–450)
UIBC: 40 ug/dL

## 2021-12-21 MED ORDER — DEXTROSE 50 % IV SOLN
INTRAVENOUS | Status: AC
Start: 2021-12-21 — End: 2021-12-21
  Administered 2021-12-21: 50 mL
  Filled 2021-12-21: qty 50

## 2021-12-21 MED ORDER — ENSURE ENLIVE PO LIQD
237.0000 mL | Freq: Two times a day (BID) | ORAL | Status: DC
  Administered 2021-12-21: 237 mL via ORAL

## 2021-12-21 MED ORDER — ADULT MULTIVITAMIN W/MINERALS CH
1.0000 | ORAL_TABLET | Freq: Every day | ORAL | Status: DC
  Administered 2021-12-21 – 2021-12-31 (×11): 1 via ORAL
  Filled 2021-12-21 (×11): qty 1

## 2021-12-21 MED ORDER — LACTATED RINGERS IV SOLN: INTRAVENOUS | Status: DC

## 2021-12-21 MED ORDER — ENSURE ENLIVE PO LIQD
237.0000 mL | Freq: Three times a day (TID) | ORAL | Status: DC
  Administered 2021-12-23 – 2021-12-28 (×8): 237 mL via ORAL

## 2021-12-21 MED ORDER — INSULIN ASPART 100 UNIT/ML IJ SOLN: 0.0000 [IU] | Freq: Three times a day (TID) | INTRAMUSCULAR | Status: DC

## 2021-12-21 MED ORDER — INSULIN ASPART 100 UNIT/ML IJ SOLN
5.0000 [IU] | Freq: Once | INTRAMUSCULAR | Status: AC
  Administered 2021-12-21: 5 [IU] via SUBCUTANEOUS

## 2021-12-21 MED ORDER — SODIUM CHLORIDE 0.9 % IV BOLUS
500.0000 mL | Freq: Once | INTRAVENOUS | Status: AC
  Administered 2021-12-21: 500 mL via INTRAVENOUS

## 2021-12-21 MED ORDER — PEG 3350-KCL-NA BICARB-NACL 420 G PO SOLR
4000.0000 mL | Freq: Once | ORAL | Status: AC
  Administered 2021-12-21: 4000 mL via ORAL
  Filled 2021-12-21: qty 4000

## 2021-12-21 MED ORDER — INSULIN ASPART 100 UNIT/ML IJ SOLN
0.0000 [IU] | Freq: Three times a day (TID) | INTRAMUSCULAR | Status: DC
  Administered 2021-12-22: 1 [IU] via SUBCUTANEOUS
  Administered 2021-12-23: 5 [IU] via SUBCUTANEOUS
  Administered 2021-12-23 – 2021-12-24 (×2): 2 [IU] via SUBCUTANEOUS
  Administered 2021-12-24 – 2021-12-25 (×3): 3 [IU] via SUBCUTANEOUS
  Administered 2021-12-25: 4 [IU] via SUBCUTANEOUS
  Administered 2021-12-25: 1 [IU] via SUBCUTANEOUS
  Administered 2021-12-26: 2 [IU] via SUBCUTANEOUS
  Administered 2021-12-26: 3 [IU] via SUBCUTANEOUS
  Administered 2021-12-27: 1 [IU] via SUBCUTANEOUS
  Administered 2021-12-27 (×2): 2 [IU] via SUBCUTANEOUS
  Administered 2021-12-28: 1 [IU] via SUBCUTANEOUS
  Administered 2021-12-28: 2 [IU] via SUBCUTANEOUS
  Administered 2021-12-29: 1 [IU] via SUBCUTANEOUS
  Administered 2021-12-29: 4 [IU] via SUBCUTANEOUS
  Administered 2021-12-30 (×2): 1 [IU] via SUBCUTANEOUS
  Administered 2021-12-31: 2 [IU] via SUBCUTANEOUS

## 2021-12-21 NOTE — Progress Notes (Signed)
Pt had a critical BS of 21 at 0549. Pt was alert and oriented with no signs of distress. Pt was rechecked to verify accuracy. Recheck was 28. Pt was given orange juice and an Amp of D50. Recheck was 221. Will recheck per protocol. ?

## 2021-12-21 NOTE — Progress Notes (Signed)
FPTS Brief Progress Note ? ?S: Patient is drinking prep when I enter the room.  He was also sitting on bedside commode.  He had no concerns or issues at this time.  Spoke with the nurse he reported he had an elevated blood sugar in the 400s and was getting ready to give him some sliding scale insulin. ? ? ?O: ?BP (!) 160/121 (BP Location: Right Arm)   Pulse 92   Temp 98.6 ?F (37 ?C)   Resp 18   Ht '5\' 10"'$  (1.778 m)   Wt 102.1 kg   SpO2 100%   BMI 32.28 kg/m?   ? ? ?A/P: ?Continue current plan from today other than changes noted below. ? ?T2DM ?Patient has sliding scale insulin ordered.  He received 10 units of sliding scale insulin last night with his blood sugars in the 300s and it dropped him to 22.  Have transition to very sensitive sliding scale which maxes out at 5 units of sliding scale insulin.  Spoke with the nurse who is going to recheck after giving 5 units and we will follow-up with further recommendations after that if needed. ? ?[name], MD ?12/21/2021, 9:06 PM ?PGY-3, [place] [facility] Night Resident  ?Please page [phone] with questions.  ?  ?

## 2021-12-21 NOTE — Plan of Care (Signed)
  Problem: Education: Goal: Knowledge of General Education information will improve Description Including pain rating scale, medication(s)/side effects and non-pharmacologic comfort measures Outcome: Progressing   

## 2021-12-21 NOTE — Evaluation (Signed)
Occupational Therapy Evaluation ?Patient Details ?Name: Jeffrey [name] ?MRN: [id number] ?DOB: 1966/05/25 ?Today's Date: 12/21/2021 ? ? ?History of Present Illness Jeffrey [name] is a 56 y.o. male presenting with dizziness and falls for past few weeks. PMH is significant for HTN, HLD, CKD, MI, cardiac arrest, anoxic brain injury, T2DM, GERD, cirrhosis, Depression, anemia of chronic disease  ? ?Clinical Impression ?  ?PTA, pt lives with family, reports Modified [place] with ADLs, driving, and mobility using Rollator. Pt endorses 4-5 falls in 2 weeks d/t syncopal episodes. Pt physically capable of completing ADLs/transfers with min guard though limited safety-wise due to symptomatic orthostatic hypotension (RN aware of BP readings). Educated re: fall prevention strategies, pacing self with postural changes, and checking BP prior to taking meds (pt feels BP medication may be contributing to current issues). Anticipate no OT needs at DC pending improvements in BP, but will continue to follow acutely. ? ?BP supine: 142/101 (114) ?BP seated: 147/105 (116) ?BP after standing < 1 min: 70/59 (65) ?BP once seated > 2 min: 102/80 (88) ?BP sitting EOB > 10 min: 89/61 (70) ?   ? ?Recommendations for follow up therapy are one component of a multi-disciplinary discharge planning process, led by the attending physician.  Recommendations may be updated based on patient status, additional functional criteria and insurance authorization.  ? ?Follow Up Recommendations ? No OT follow up  ?  ?Assistance Recommended at Discharge Intermittent Supervision/Assistance  ?Patient can return home with the following Assistance with cooking/housework;Assist for transportation;Help with stairs or ramp for entrance ? ?  ?Functional Status Assessment ? Patient has had a recent decline in their functional status and demonstrates the ability to make significant improvements in function in a reasonable and predictable amount of time.  ?Equipment  Recommendations ? None recommended by OT  ?  ?Recommendations for Other Services   ? ? ?  ?Precautions / Restrictions Precautions ?Precautions: Fall;Other (comment) ?Precaution Comments: monitor orthostatics ?Restrictions ?Weight Bearing Restrictions: No  ? ?  ? ?Mobility Bed Mobility ?Overal bed mobility: Modified Independent ?  ?  ?  ?  ?  ?  ?  ?  ? ?Transfers ?Overall transfer level: Needs assistance ?Equipment used: Rolling walker (2 wheels) ?Transfers: Sit to/from Stand ?Sit to Stand: Min guard ?  ?  ?  ?  ?  ?General transfer comment: increased effort to power up, noted to place B UE on RW to stand ?  ? ?  ?Balance Overall balance assessment: Needs assistance, History of Falls ?Sitting-balance support: No upper extremity supported, Feet supported ?Sitting balance-Leahy Scale: Good ?  ?  ?Standing balance support: Bilateral upper extremity supported, During functional activity ?Standing balance-Leahy Scale: Fair ?Standing balance comment: able to stand without support, noted to reach for RW after prolonged standing due to onset of syncopal symptoms ?  ?  ?  ?  ?  ?  ?  ?  ?  ?  ?  ?   ? ?ADL either performed or assessed with clinical judgement  ? ?ADL Overall ADL's : Needs assistance/impaired ?Eating/Feeding: Independent;Sitting ?Eating/Feeding Details (indicate cue type and reason): EOB ?Grooming: Min guard;Standing ?  ?Upper Body Bathing: Set up;Sitting ?  ?Lower Body Bathing: Min guard;Sit to/from stand;Sitting/lateral leans ?  ?Upper Body Dressing : Set up;Sitting ?  ?Lower Body Dressing: Min guard;Sit to/from stand;Sitting/lateral leans ?  ?Toilet Transfer: Min guard;Stand-pivot;Rolling walker (2 wheels) ?  ?Toileting- Clothing Manipulation and Hygiene: Min guard;Sitting/lateral lean;Sit to/from stand ?  ?  ?  ?  ?  General ADL Comments: Limited by symptomatic orthostatic hypotension; likely physically capable of completed tasks without physical assist though syncopal events cause safety concerns.   ? ? ? ?Vision Baseline Vision/History: 1 Wears glasses ?Ability to See in Adequate Light: 1 Impaired ?Patient Visual Report: No change from baseline ?Vision Assessment?: No apparent visual deficits  ?   ?Perception   ?  ?Praxis   ?  ? ?Pertinent Vitals/Pain Pain Assessment ?Pain Assessment: No/denies pain  ? ? ? ?Hand Dominance Right ?  ?Extremity/Trunk Assessment Upper Extremity Assessment ?Upper Extremity Assessment: Overall WFL for tasks assessed ?  ?Lower Extremity Assessment ?Lower Extremity Assessment: Defer to PT evaluation ?  ?Cervical / Trunk Assessment ?Cervical / Trunk Assessment: Normal ?  ?Communication Communication ?Communication: No difficulties ?  ?Cognition Arousal/Alertness: Awake/alert ?Behavior During Therapy: [facility] for tasks assessed/performed ?Overall Cognitive Status: History of cognitive impairments - at baseline ?  ?  ?  ?  ?  ?  ?  ?  ?  ?  ?  ?  ?  ?  ?  ?  ?General Comments: hx of anoxic brain injury; pleasant and appropriate throughout session; shows insight into deficits and safety concerns ?  ?  ?General Comments    ? ?  ?Exercises   ?  ?Shoulder Instructions    ? ? ?Home Living Family/patient expects to be discharged to:: Private residence ?Living Arrangements: Children ?Available Help at Discharge: Family ?Type of Home: House ?Home Access: Stairs to enter ?Entrance Stairs-Number of Steps: 3 ?Entrance Stairs-Rails: Right ?Home Layout: One level ?  ?  ?Bathroom Shower/Tub: Tub/shower unit ?  ?Bathroom Toilet: Standard ?  ?  ?Home Equipment: Merchant navy officer (4 wheels);Adaptive equipment;Tub bench;Rolling Walker (2 wheels) ?Adaptive Equipment: Reacher;Sock aid ?  ?  ? ?  ?Prior Functioning/Environment Prior Level of Function : Independent/Modified Independent ?  ?  ?  ?  ?  ?  ?Mobility Comments: uses a rollator; 4-5 falls in past 2 weeks due to syncopal episodes ?ADLs Comments: Modified Independent with ADLs, reports driving and grocery shopping. Pt reports progressing beyond using  tub transfer bench and able to step over and stand in showers ?  ? ?  ?  ?OT Problem List: Impaired balance (sitting and/or standing);Decreased activity tolerance ?  ?   ?OT Treatment/Interventions: Self-care/ADL training;Therapeutic exercise;DME and/or AE instruction;Therapeutic activities;Patient/family education  ?  ?OT Goals(Current goals can be found in the care plan section) Acute Rehab OT Goals ?Patient Stated Goal: resolve issues to avoid further falls ?OT Goal Formulation: With patient ?Time For Goal Achievement: 01/04/22 ?Potential to Achieve Goals: Good  ?OT Frequency: Min 2X/week ?  ? ?Co-evaluation   ?  ?  ?  ?  ? ?  ?AM-PAC OT "6 Clicks" Daily Activity     ?Outcome Measure Help from another person eating meals?: None ?Help from another person taking care of personal grooming?: A Little ?Help from another person toileting, which includes using toliet, bedpan, or urinal?: A Little ?Help from another person bathing (including washing, rinsing, drying)?: A Little ?Help from another person to put on and taking off regular upper body clothing?: A Little ?Help from another person to put on and taking off regular lower body clothing?: A Little ?6 Click Score: 19 ?  ?End of Session Equipment Utilized During Treatment: Rolling walker (2 wheels) ?Nurse Communication: Mobility status;Other (comment) (BP) ? ?Activity Tolerance: Patient tolerated treatment well;Other (comment) (limited by BP drops) ?Patient left: in bed;with call bell/phone within reach;with bed alarm set (  sitting EOB with breakfast) ? ?OT Visit Diagnosis: Unsteadiness on feet (R26.81)  ?              ?Time: 4627-0350 ?OT Time Calculation (min): 28 min ?Charges:  OT General Charges ?$OT Visit: 1 Visit ?OT Evaluation ?$OT Eval Low Complexity: 1 Low ?OT Treatments ?$Self Care/Home Management : 8-22 mins ? ?Malachy Chamber, OTR/L ?Acute Rehab Services ?Office: [phone]  ? ?Layla Maw ?12/21/2021, 7:58 AM ?

## 2021-12-21 NOTE — Progress Notes (Addendum)
Family Medicine Teaching Service ?Daily Progress Note ?Intern Pager: [phone] ? ?Patient name: Jeffrey [name] Medical record number: [id number] ?Date of birth: [birth year] Age: 56 y.o. Gender: male ? ?Primary Care Provider: [name], MD ?Consultants: None ?Code Status: Full ? ?Pt Overview and Major Events to Date:  ?3/16- Admitted ? ?Assessment and Plan: ?Jeffrey [name] is a 56 year-old male who presented with dizziness and falls for the past few weeks, with profound hypoglycemia on admission and continued workup for likely multifactorial etiology of generalized weakness and falls. PMH significant for HTN, HLD, CKD, MI, cardiac arrest, anoxic brain injury, T2DM, GERD, cirrhosis, depression, anemia of chronic disease. ? ?Generalized weakness and falls in setting of malnutrition ?Feeling okay this morning, denies any current dizziness overnight, had severe hypoglycemia to 20s, and presented in the 300s. Wide ranging glucose levels concerning and is likely part of cause of feeling of pre-syncope and falls at home. Needs better control.  Per the patient, he was able to tell me his medicine regimen verbatim, and manages his own medicines at home.  I doubt that medication compliance/health literacy is the issue.  ?Needs RD assessment for nutrition status, as he has severely diminished appetite and caloric intake at home.  Although he does not have any notable muscle wasting, cachexia on exam, he likely has underlying malnutrition.  Combination of anemia, hypoglycemia, malnutrition all likely contributors to current decline.  Also question if given his premature cardiac history with cardiac arrest and MI if he is having worsening atherosclerosis of his arteries causing lack of compensation worsening hypotension.  Infection unlikely as UA negative, afebrile, no leukocytosis. ?- Monitor vital signs ?- Continuous cardiac monitoring ?- RD consult ?- PT/OT eval and treat ? ?T2DM ?Had severe hypoglycemia yesterday in the  20s that resolved with orange juice and D50 ampule x1.  Recheck was 221.  CBG this morning 124, 108.  We will hold off on sliding scale insulin for now given high concern for continued hypoglycemia in setting of low intake of food. ?-Monitor CBG with meals and at bedtime ?-Continue home gabapentin ? ?Acute hypotension with + orthostatics, history of hypertension ?BP initially hypertensive on arrival as high as 180s/110s.  While in the room examining him, he was seated on the bed with blood pressures in the 80s-90s/60s, would lean on the bed intermittently like he was going to lay down.  Denied dizziness.  Hypotension likely secondary to dehydration, as he says he has not had many fluids.  Ordered 500 cc NS bolus. ?-S/p 500 cc NS bolus ?-Place TED hose ?-Monitor vital signs ?-Fall precautions ?-Hold antihypertensives ? ?AKI on CKD stage IIIa ?Creatinine today 1.63.  Baseline 1.1.  Likely prerenal in setting of dehydration. ?Did receive 1 dose of home Lasix and Cozaar yesterday, both of which have been discontinued. ?-AM BMP ?-Strict I/os ?-Avoid nephrotoxic medications ? ?Microcytic anemia with hx anemia of chronic disease ?No overt or obvious signs of bleeding.  He does tell me he has had some changes in his stool caliber recently, but denies any blood in stool.  Needs to have a colonoscopy.  Iron studies unremarkable for an iron deficiency anemia.  Baseline hemoglobin is 9-10.  Hemoglobin this morning 7.8.  Patient refuses blood transfusion as he believes this caused his cardiac arrest in the past. Question if liver cirrhosis is also factor in anemia given profound hypoalbuminemia. ?-Monitor hemoglobin with CBC ?-Consider consult to gastroenterology for colonoscopy while he is inpatient ? ?Other conditions chronic and stable ?Seizure disorder,  HLD, GERD, hx ETOH use disorder, hx MI ? ?FEN/GI: Regular diet ?PPx: Lovenox ?Dispo:Pending PT recommendations  pending clinical improvement . Barriers include continued  inpatient work-up for generalized falls, weakness; placement.  ? ?Subjective:  ?Jeffrey [name] was eating breakfast this morning. He denies current dizziness. He says ever since starting his Losartan he has been dizzy. He takes his medications as prescribed. He says he eats "nothing" during the day and only eats "when I get really hungry." ? ?Objective: ?Temp:  [98 ?F (36.7 ?C)-98.6 ?F (37 ?C)] 98.6 ?F (37 ?C) (03/17 0426) ?Pulse Rate:  [100-110] 100 (03/17 0426) ?Resp:  [10-21] 18 (03/17 0426) ?BP: (120-196)/(80-137) 120/80 (03/17 0426) ?SpO2:  [99 %-100 %] 100 % (03/17 0426) ?Weight:  [102.1 kg] 102.1 kg (03/16 1547) ?Physical Exam: ?General: Sitting on edge of bed, eating breakfast ?Cardiovascular: Sinus tachycardia to 100-110. No murmurs appreciated. Normal rhythm ?Respiratory: Clear in all fields. Normal work of breathing. No wheezing or crackles ?Abdomen: Soft, non-tender, non-distended. ?Extremities: Warm, dry. No edema. ? ?Laboratory: ?Recent Labs  ?Lab 12/20/21 ?1547 12/20/21 ?1600 12/21/21 ?0419  ?WBC 4.7  --  7.0  ?HGB 7.6* 9.2* 7.8*  ?HCT 23.5* 27.0* 22.9*  ?PLT 344  --  325  ? ?Recent Labs  ?Lab 12/20/21 ?1547 12/20/21 ?1600 12/21/21 ?0419  ?NA 137 137 142  ?K 4.8 4.7 4.4  ?CL 97*  --  99  ?CO2 28  --  32  ?BUN 18  --  18  ?CREATININE 1.83*  --  1.63*  ?CALCIUM 7.1*  --  7.7*  ?PROT 5.7*  --   --   ?BILITOT 0.5  --   --   ?ALKPHOS 237*  --   --   ?ALT 42  --   --   ?AST 44*  --   --   ?GLUCOSE 335*  --  28*  ? ? ?Imaging/Diagnostic Tests: ?DG Chest Portable 1 View ? ?Result Date: 12/20/2021 ?CLINICAL DATA:  Dizziness. EXAM: PORTABLE CHEST 1 VIEW COMPARISON:  September 13, 2021 FINDINGS: The heart size and mediastinal contours are within normal limits. Both lungs are clear. The visualized skeletal structures are unremarkable. IMPRESSION: No active disease. Electronically Signed   By: Virgina Norfolk M.D.   On: 12/20/2021 17:25   ? ? ?Orvis Brill, DO ?12/21/2021, 7:26 AM ?PGY-1, [place] ?[place] Intern pager: [phone], text pages welcome ? ?

## 2021-12-21 NOTE — Hospital Course (Addendum)
Jeffrey [name] is a 56 year old male who was admitted for falls and weakness. PMH significant for HTN, HLD, CKD, MI, cardiac arrest, anoxic brain injury, T2DM, GERD, cirrhosis, depression, anemia of chronic disease. Hospital course is outlined below: ? ?Generalized weakness/falls with malnutrition ?Presented with 2-3 weeks of pre-syncope and falls. Labs remarkable for glu 302 with pH 7.47, bicarb 28, AG 12 therefore not in DKA. Ionized ca of 0.95 which was repleted with calcium gluconate 2g IV in ED. Hgb 7.6, WBC WNL, Cr elevated 1.83, alk phos 237. CRX unremarkable. ECG shows sinus tach of 120 bpm and prolonged QTc to 501 and trop 17>15.  GI and RD were consulted for poor p.o. intake.  Patient had both colonoscopy and EGD performed which were both unremarkable.  Patient was very dizzy and presyncopal with exertion despite holding losartan for several days.  Echo was performed which showed 65 to 70% EF with no regional wall motion abnormalities, which is similar to October 2022 echo.  Cardiology was consulted and recommended ACTH stim test to rule out adrenal insufficiency as well as starting compression hoses but no further cardiac work-up was necessary.  Abnormal ACTH stim test showed inappropriately normal a.m. 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  ? ?Anemia of chronic disease ?Patient's hemoglobin was persistently in the 7.0-9.0 range.  Patient received 3 units PRBC during this hospitalization.  ? ?T2DM ?Patient CBG greatly fluctuated in the setting of minimal insulin use from 20s to 300s.  Patient was restarted on 5 units Lantus daily at bedtime with very sensitive sliding scale insulin. ? ?Acute hypotension with + orthostatics ?Patient initially had positive orthostatics but repeat orthostatics were negative.  ? ?AKI on CKD stage IIIa ?Creatinine slightly elevated to 1.56, baseline 0.7-1.0.  This was likely prerenal given his bowel prep for EGD/colonoscopy.  Nephrotoxic  medications were avoided and BMP was monitored throughout hospitalization. ? ?Patient's other chronic conditions were monitored and home medications were resumed as needed (seizure disorder, hyperlipidemia, GERD, history of EtOH use disorder, history of MI) ? ?Follow up items: ?Consider ID referral outpatient for fecal transplant ?Ensure completion of antibiotics for presumed C. difficile colitis. Should have finished full 10 day of fidaxomicin by time of hospital follow up visit ?Recommend urology referral for chronic gross hematuria. 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 ?Assess fluid status  ?Assess CBGs and need for insulin ?Assess need for ASA. Held in setting of chronic anemia ? ?

## 2021-12-21 NOTE — H&P (View-Only) (Signed)
Reason for Consult: IDA ?Referring Physician: [facility] ? ?[name] ?HPI: This is a 56 year old male with a PMH of erosive esophagitis with bleeding, ETOH cirrhosis, GERD, anoxic brain injury, ETOH abuse, HTN, and cardiac arrest with PEA admitted for complaints of near syncope.  The patient states that he had issues with falls of late and a sensation of fainting.  Work up in the ER was negative for any cardiac or neurologic source, but he was noted to be anemic.  There is a history of chronic anemia, but his values in the ER were less than the baseline (9-10 g/dL) at 7.6 g/dL.  In the past he was evaluated with an EGD by Drs. Sofie Hartigan, and Henrene Pastor.  All of the procedures showed an erosive esophagitis and he presented with various complaints of hematemesis and/or melena.  There was no evidence of any esophageal or fundic varcies.  During the last EGD with Dr. Henrene Pastor on 11/01/2020, he had extensive erosions in the proximal duodenum.  The patient was supposed to follow up with Dr. Fuller Plan as an outpatient, but he was never able to make the appointment.  The patient currently denies any problems with hematochezia, melena, hematemesis, or abdominal pain. ? ?Past Medical History:  ?Diagnosis Date  ? Acute renal insufficiency 12/08/2012  ? AKI (acute kidney injury) ([place])   ? ALCOHOL ABUSE, HX OF 11/10/2007  ? Anoxic encephalopathy ([place])   ? Anxiety   ? Bleeding ulcer 2014  ? Blood transfusion 2014  ? "related to bleeding ulcer"  ? Cardiac arrest ([place]) 12/07/2012  ? Anoxic encephalopathy  ? GERD (gastroesophageal reflux disease)   ? High cholesterol   ? Hypertension   ? OSA on CPAP 12/07/2012  ? Seizure-like activity ([place])   ? Seizures ([place])   ? last seizure 01/05/2021  ? Type II diabetes mellitus ([place])   ? Venous insufficiency 12/13/2011  ? ? ?Past Surgical History:  ?Procedure Laterality Date  ? BIOPSY  11/01/2020  ? Procedure: BIOPSY;  Surgeon: Irene Shipper, MD;  Location: [place];  Service: Endoscopy;;  ?  CARDIOVASCULAR STRESS TEST  03/16/13  ? Very Poor Exercise Tolerance; NON DIAGNOSTIC TEST  ? CATARACT EXTRACTION W/ INTRAOCULAR LENS IMPLANT Left 03/07/2014  ? [place] @ [facility]  ? ESOPHAGOGASTRODUODENOSCOPY Left 11/26/2012  ? Procedure: ESOPHAGOGASTRODUODENOSCOPY (EGD);  Surgeon: Wonda Horner, MD;  Location: [facility] ENDOSCOPY;  Service: Endoscopy;  Laterality: Left;  ? ESOPHAGOGASTRODUODENOSCOPY N/A 10/10/2014  ? Procedure: ESOPHAGOGASTRODUODENOSCOPY (EGD);  Surgeon: Ladene Artist, MD;  Location: [facility] ENDOSCOPY;  Service: Endoscopy;  Laterality: N/A;  ? ESOPHAGOGASTRODUODENOSCOPY (EGD) WITH PROPOFOL N/A 11/01/2020  ? Procedure: ESOPHAGOGASTRODUODENOSCOPY (EGD) WITH PROPOFOL;  Surgeon: Irene Shipper, MD;  Location: [facility] ENDOSCOPY;  Service: Endoscopy;  Laterality: N/A;  ? KNEE ARTHROSCOPY Left ~ 1999  ? UPPER GASTROINTESTINAL ENDOSCOPY    ? ? ?Family History  ?Problem Relation Age of Onset  ? Other Mother   ?     Unsure of medical history.  ? Diabetes Mellitus II Father   ? Colon polyps Neg Hx   ? Colon cancer Neg Hx   ? Esophageal cancer Neg Hx   ? Rectal cancer Neg Hx   ? Stomach cancer Neg Hx   ? ? ?Social History:  reports that he has never smoked. His smokeless tobacco use includes snuff. He reports current alcohol use. He reports that he does not use drugs. ? ?Allergies:  ?Allergies  ?Allergen Reactions  ? Atorvastatin Other (  See Comments)  ?  Cause Muscle Weakness  ? Drug Ingredient [Spironolactone] Other (See Comments)  ?  Hyperkalemia  ? Lisinopril Other (See Comments)  ?  Pt reports nose bleeds  ? Losartan Other (See Comments)  ?  Patient stopped taking this because he felt like passing out after standing up  ? ? ?Medications: Scheduled: ? aspirin EC  81 mg Oral QHS  ? DULoxetine  30 mg Oral QHS  ? enoxaparin (LOVENOX) injection  40 mg Subcutaneous Q24H  ? feeding supplement  237 mL Oral TID BM  ? folic acid  1 mg Oral QHS  ? gabapentin  900 mg Oral TID  ? [START ON 12/22/2021] insulin aspart  0-9 Units  Subcutaneous TID WC  ? levETIRAcetam  1,000 mg Oral BID  ? multivitamin with minerals  1 tablet Oral Daily  ? pantoprazole  40 mg Oral QHS  ? polyethylene glycol-electrolytes  4,000 mL Oral Once  ? thiamine  100 mg Oral QHS  ? ?Continuous: ? lactated ringers 125 mL/hr at 12/21/21 1319  ? ? ?Results for orders placed or performed during the hospital encounter of 12/20/21 (from the past 24 hour(s))  ?Troponin I (High Sensitivity)     Status: None  ? Collection Time: 12/20/21  7:15 PM  ?Result Value Ref Range  ? Troponin I (High Sensitivity) 15 <18 ng/L  ?CBG monitoring, ED     Status: Abnormal  ? Collection Time: 12/20/21  7:42 PM  ?Result Value Ref Range  ? Glucose-Capillary 273 (H) 70 - 99 mg/dL  ?Ethanol     Status: None  ? Collection Time: 12/20/21  9:19 PM  ?Result Value Ref Range  ? Alcohol, Ethyl (B) <10 <10 mg/dL  ?CBG monitoring, ED     Status: Abnormal  ? Collection Time: 12/20/21 10:04 PM  ?Result Value Ref Range  ? Glucose-Capillary 181 (H) 70 - 99 mg/dL  ?CBC     Status: Abnormal  ? Collection Time: 12/21/21  4:19 AM  ?Result Value Ref Range  ? WBC 7.0 4.0 - 10.5 K/uL  ? RBC 3.16 (L) 4.22 - 5.81 MIL/uL  ? Hemoglobin 7.8 (L) 13.0 - 17.0 g/dL  ? HCT 22.9 (L) 39.0 - 52.0 %  ? MCV 72.5 (L) 80.0 - 100.0 fL  ? MCH 24.7 (L) 26.0 - 34.0 pg  ? MCHC 34.1 30.0 - 36.0 g/dL  ? RDW 27.4 (H) 11.5 - 15.5 %  ? Platelets 325 150 - 400 K/uL  ? nRBC 0.0 0.0 - 0.2 %  ?Basic metabolic panel     Status: Abnormal  ? Collection Time: 12/21/21  4:19 AM  ?Result Value Ref Range  ? Sodium 142 135 - 145 mmol/L  ? Potassium 4.4 3.5 - 5.1 mmol/L  ? Chloride 99 98 - 111 mmol/L  ? CO2 32 22 - 32 mmol/L  ? Glucose, Bld 28 (LL) 70 - 99 mg/dL  ? BUN 18 6 - 20 mg/dL  ? Creatinine, Ser 1.63 (H) 0.61 - 1.24 mg/dL  ? Calcium 7.7 (L) 8.9 - 10.3 mg/dL  ? GFR, Estimated 49 (L) >60 mL/min  ? Anion gap 11 5 - 15  ?Glucose, capillary     Status: Abnormal  ? Collection Time: 12/21/21  5:39 AM  ?Result Value Ref Range  ? Glucose-Capillary 22 (LL) 70 -  99 mg/dL  ?Glucose, capillary     Status: Abnormal  ? Collection Time: 12/21/21  6:10 AM  ?Result Value Ref Range  ? Glucose-Capillary 221 (H)  70 - 99 mg/dL  ?Glucose, capillary     Status: Abnormal  ? Collection Time: 12/21/21  6:34 AM  ?Result Value Ref Range  ? Glucose-Capillary 124 (H) 70 - 99 mg/dL  ?Glucose, capillary     Status: Abnormal  ? Collection Time: 12/21/21  7:24 AM  ?Result Value Ref Range  ? Glucose-Capillary 108 (H) 70 - 99 mg/dL  ?Glucose, capillary     Status: Abnormal  ? Collection Time: 12/21/21 11:58 AM  ?Result Value Ref Range  ? Glucose-Capillary 171 (H) 70 - 99 mg/dL  ?Urinalysis, Routine w reflex microscopic     Status: Abnormal  ? Collection Time: 12/21/21  3:24 PM  ?Result Value Ref Range  ? Color, Urine YELLOW YELLOW  ? APPearance HAZY (A) CLEAR  ? Specific Gravity, Urine 1.013 1.005 - 1.030  ? pH 5.0 5.0 - 8.0  ? Glucose, UA 50 (A) NEGATIVE mg/dL  ? Hgb urine dipstick LARGE (A) NEGATIVE  ? Bilirubin Urine NEGATIVE NEGATIVE  ? Ketones, ur NEGATIVE NEGATIVE mg/dL  ? Protein, ur 100 (A) NEGATIVE mg/dL  ? Nitrite NEGATIVE NEGATIVE  ? Leukocytes,Ua NEGATIVE NEGATIVE  ? RBC / HPF >50 (H) 0 - 5 RBC/hpf  ? WBC, UA 6-10 0 - 5 WBC/hpf  ? Bacteria, UA FEW (A) NONE SEEN  ? Squamous Epithelial / LPF 0-5 0 - 5  ? Mucus PRESENT   ? Hyaline Casts, UA PRESENT   ?Glucose, capillary     Status: Abnormal  ? Collection Time: 12/21/21  4:33 PM  ?Result Value Ref Range  ? Glucose-Capillary 309 (H) 70 - 99 mg/dL  ?  ? ?DG Chest Portable 1 View ? ?Result Date: 12/20/2021 ?CLINICAL DATA:  Dizziness. EXAM: PORTABLE CHEST 1 VIEW COMPARISON:  September 13, 2021 FINDINGS: The heart size and mediastinal contours are within normal limits. Both lungs are clear. The visualized skeletal structures are unremarkable. IMPRESSION: No active disease. Electronically Signed   By: Virgina Norfolk M.D.   On: 12/20/2021 17:25   ? ?ROS:  As stated above in the HPI otherwise negative. ? ?Blood pressure (!) 142/95, pulse (!) 105,  temperature (!) 97.5 ?F (36.4 ?C), temperature source Oral, resp. rate 18, height '5\' 10"'$  (1.778 m), weight 102.1 kg, SpO2 100 %.   ? ?PE: ?Gen: NAD, Alert and Oriented ?HEENT:  [place]/AT, EOMI ?Neck: Supple, no LAD ?

## 2021-12-21 NOTE — Plan of Care (Signed)

## 2021-12-21 NOTE — Progress Notes (Signed)
Initial Nutrition Assessment ? ?DOCUMENTATION CODES:  ? ?Not applicable ? ?INTERVENTION:  ? ?Ensure Enlive po TID, each supplement provides 350 kcal and 20 grams of protein. ? ?MVI with minerals daily. ? ?NUTRITION DIAGNOSIS:  ? ?Inadequate oral intake related to poor appetite as evidenced by per patient/family report. ? ?GOAL:  ? ?Patient will meet greater than or equal to 90% of their needs ? ?MONITOR:  ? ?PO intake, Supplement acceptance, Labs ? ?REASON FOR ASSESSMENT:  ? ?Malnutrition Screening Tool, Consult ?Assessment of nutrition requirement/status ? ?ASSESSMENT:  ? ?56 yo male admitted with hypoglycemia, weakness, frequent falls. PMH includes DM-2, HTN, cardiac arrest, HLD, GERD, alcohol abuse. ? ?Patient reports poor appetite for the past 5-6 years. He is mobile at home and walks around his house; uses a walker when he goes somewhere else. He used to be a heavy alcohol drinker, but states that he no longer drinks alcohol. He states that he does not eat anything at home. He drinks Ensure supplements, V-8 juice, and water, but does not eat any food. Patient was unable to provide specific information regarding his intake at home. He says he has good intentions to eat at home, but it just doesn't happen. He likes the [employer] brand PO supplements. He did not eat breakfast this morning because there were too many people in his room, but plans to eat lunch today.  ? ?Labs reviewed. Albumin < 1.5 (L) Albumin is not an indicator of nutritional status, but rather an indicator of morbidity and mortality, and recovery from acute and chronic illness. ? ?CBG: [id number] ? ?Medications reviewed and include folic acid, Keppra, thiamine. ?IVF: LR at 125 ml/h ? ?Weight history reviewed. Weight seems to fluctuate up and down, but no significant weight changes noted. ? ?Patient has some mild depletion of muscle and subcutaneous fat mass, but does not meet criteria for malnutrition at this time. ? ?NUTRITION - FOCUSED  PHYSICAL EXAM: ? ?Flowsheet Row Most Recent Value  ?Orbital Region No depletion  ?Upper Arm Region Mild depletion  ?Thoracic and Lumbar Region No depletion  ?Buccal Region No depletion  ?Temple Region Mild depletion  ?Clavicle Bone Region Mild depletion  ?Clavicle and Acromion Bone Region No depletion  ?Scapular Bone Region No depletion  ?Dorsal Hand Mild depletion  ?Patellar Region Unable to assess  ?Anterior Thigh Region Unable to assess  ?Posterior Calf Region No depletion  ?Edema (RD Assessment) None  ?Hair Reviewed  ?Eyes Reviewed  ?Mouth Reviewed  ?Skin Reviewed  ?Nails Reviewed  ? ?  ? ? ?Diet Order:   ?Diet Order   ? ?       ?  Diet regular Room service appropriate? Yes; Fluid consistency: Thin  Diet effective now       ?  ? ?  ?  ? ?  ? ? ?EDUCATION NEEDS:  ? ?Not appropriate for education at this time ? ?Skin:  Skin Assessment: Reviewed RN Assessment ? ?Last BM:  no BM documented ? ?Height:  ? ?Ht Readings from Last 1 Encounters:  ?12/20/21 '5\' 10"'$  (1.778 m)  ? ? ?Weight:  ? ?Wt Readings from Last 1 Encounters:  ?12/20/21 102.1 kg  ? ? ?BMI:  Body mass index is 32.28 kg/m?. ? ?Estimated Nutritional Needs:  ? ?Kcal:  2000-2200 ? ?Protein:  100-120 gm ? ?Fluid:  2-2.2 L ? ? ? ?Lucas Mallow RD, LDN, CNSC ?Please refer to Amion for contact information.                                                       ? ?

## 2021-12-21 NOTE — TOC Initial Note (Signed)
Transition of Care (TOC) - Initial/Assessment Note  ? ? ?Patient Details  ?Name: Jeffrey [name] ?MRN: [id number] ?Date of Birth: 28-Mar-1966 ? ?Transition of Care (TOC) CM/SW Contact:    ?Tom-Johnson, Renea Ee, RN ?Phone Number: ?12/21/2021, 3:02 PM ? ?Clinical Narrative:                 ? ?CM spoke with patient at bedside about needs for post hospital transition. Admitted for Pre-Syncope. Lives with three of four of his children. Has three siblings and are supportive with his care. No currently employed and not on disability. States he purchased his [place].  Independent with care and drive self prior to admission. Has a cane, rollator and shower seat at home.  ?PCP is [name], MD and uses [facility] on [address].  ?No OT followup noted. Awaiting PT Eval. Friend to transport at discharge.CM will continue to follow with needs.  ? ?Expected Discharge Plan: Home/Self Care ?Barriers to Discharge: Continued Medical Work up ? ? ?Patient Goals and CMS Choice ?Patient states their goals for this hospitalization and ongoing recovery are:: To return home ?CMS Medicare.gov Compare Post Acute Care list provided to:: Patient ?Choice offered to / list presented to : NA ? ?Expected Discharge Plan and Services ?Expected Discharge Plan: Home/Self Care ?  ?Discharge Planning Services: CM Consult ?Post Acute Care Choice: NA ?Living arrangements for the past 2 months: [place] ?                ?DME Arranged: N/A ?DME Agency: NA ?  ?  ?  ?  ?[place] Agency: NA ?  ?  ?  ? ?Prior Living Arrangements/Services ?Living arrangements for the past 2 months: [place] ?Lives with:: Minor Children ?Patient language and need for interpreter reviewed:: Yes ?Do you feel safe going back to the place where you live?: Yes      ?Need for Family Participation in Patient Care: Yes (Comment) ?Care giver support system in place?: Yes (comment) ?Current home services: DME (Rollator, cane,  showerseat) ?Criminal Activity/Legal Involvement Pertinent to Current Situation/Hospitalization: No - Comment as needed ? ?Activities of Daily Living ?Home Assistive Devices/Equipment: Chana Bode (specify type) (rollator) ?ADL Screening (condition at time of admission) ?Patient's cognitive ability adequate to safely complete daily activities?: Yes ?Is the patient deaf or have difficulty hearing?: No ?Does the patient have difficulty seeing, even when wearing glasses/contacts?: Yes ?Does the patient have difficulty concentrating, remembering, or making decisions?: Yes ?Patient able to express need for assistance with ADLs?: Yes ?Does the patient have difficulty dressing or bathing?: No ?Independently performs ADLs?: Yes (appropriate for developmental age) ?Does the patient have difficulty walking or climbing stairs?: Yes ?Weakness of Legs: Right ?Weakness of Arms/Hands: Right ? ?Permission Sought/Granted ?Permission sought to share information with : Case Manager, Family Supports ?Permission granted to share information with : Yes, Verbal Permission Granted ?   ?   ?   ?   ? ?Emotional Assessment ?Appearance:: Appears stated age ?Attitude/Demeanor/Rapport: Engaged, Gracious ?Affect (typically observed): Accepting, Appropriate, Calm, Hopeful ?Orientation: : Oriented to Self, Oriented to Place, Oriented to  Time, Oriented to Situation ?Alcohol / Substance Use: Not Applicable ?Psych Involvement: No (comment) ? ?Admission diagnosis:  Hypocalcemia [E83.51] ?Hypoalbuminemia [E88.09] ?Prolonged Q-T interval on ECG [R94.31] ?Hyperglycemia [R73.9] ?Pre-syncope [R55] ?Chronic anemia [D64.9] ?AKI (acute kidney injury) ([place]) [N17.9] ?Patient Active Problem List  ? Diagnosis Date Noted  ? Pre-syncope 12/20/2021  ? Hypocalcemia 12/06/2021  ? Demand ischemia (  [place]) 09/16/2021  ? Hypothermia 09/13/2021  ? Hypoalbuminemia   ? Recurrent falls 07/29/2021  ? Grief counseling 01/04/2021  ? Incontinence 12/23/2020  ? Hospital  discharge follow-up 11/24/2020  ? Syncope 11/17/2020  ? Hypomagnesemia 11/05/2020  ? Hematemesis with nausea   ? Alcohol abuse with intoxication ([place])   ? Duodenal ulcer hemorrhagic   ? GI bleed 10/31/2020  ? Hyperglycemia 10/31/2020  ? DKA (diabetic ketoacidosis) ([place]) 07/22/2020  ? Candidiasis 07/22/2020  ? Hyponatremia 06/28/2020  ? Eyebrow laceration, left, initial encounter   ? Hypokalemia   ? Hyperlipidemia associated with type 2 diabetes mellitus ([place])   ? MGUS (monoclonal gammopathy of unknown significance) 05/31/2020  ? Seizure disorder ([place]) 03/16/2020  ? Type 2 diabetes mellitus with hyperglycemia, with long-term current use of insulin ([place]) 03/16/2020  ? Tremor 03/16/2020  ? Colon cancer screening 02/22/2020  ? Stage 3a chronic kidney disease ([place]) 01/29/2020  ? Hyperphosphatemia 01/29/2020  ? Major depressive disorder, recurrent episode ([place]) 01/13/2018  ? Weakness   ? Sepsis secondary to UTI [facility])   ? Hypotension 07/27/2017  ? Seizure ([place]) 04/08/2017  ? Proteinuria 11/23/2015  ? Essential hypertension   ? Cirrhosis ([place]) 03/22/2015  ? Ataxia 03/20/2015  ? Elevated liver enzymes   ? Dysarthria 03/09/2015  ? AKI (acute kidney injury) ([place])   ? Acute blood loss anemia 11/17/2014  ? ETOH abuse   ? Tachycardia   ? Hematemesis 11/16/2014  ? Melena 10/10/2014  ? Erosive esophagitis 10/10/2014  ? Anemia of chronic disease   ? Somnolence 10/08/2014  ? Acute kidney injury ([place])   ? OSA on CPAP 12/28/2012  ? Type 2 diabetes mellitus with hypoglycemia without coma ([place]) 12/07/2012  ? Cardiac arrest ([place]) 12/07/2012  ? Anoxic brain injury ([place]) 12/07/2012  ? ERECTILE DYSFUNCTION 04/14/2007  ? Type 2 diabetes mellitus with diabetic neuropathy, with long-term current use of insulin ([place]) 12/04/2006  ? HYPERCHOLESTEROLEMIA 12/04/2006  ? ?PCP:  [name], MD ?Pharmacy:   ?[place], [address] ?[place] [address] ?Phone: [phone] Fax: [phone] ? ?[facility]  (MAIL ORDER) [facility] - [place], [place] ?[address] ?[place] [address] ?Phone: [phone] Fax: [phone] ? ?[place] ?[address] ?[place] [address] ?Phone: [phone] Fax: [phone] ? ?[place], [place] ?[address] ?[place] [address] ?Phone: [phone] Fax: [phone] ? ? ? ? ?Social Determinants of Health (SDOH) Interventions ?  ? ?Readmission Risk Interventions ?Readmission Risk Prevention Plan 09/17/2021 08/01/2021  ?Transportation Screening Complete Complete  ?PCP or Specialist Appt within 5-7 Days - Complete  ?PCP or Specialist Appt within 3-5 Days Complete -  ?Home Care Screening - Patient refused  ?Medication Review (RN CM) - Complete  ?[place] or Home Care Consult Complete -  ?Social Work Consult for [place] Planning/Counseling Complete -  ?Palliative Care Screening Not Applicable -  ?Medication Review Press photographer) Complete -  ?Some recent data might be hidden  ? ? ? ?

## 2021-12-21 NOTE — Consult Note (Signed)
Reason for Consult: IDA Referring Physician: [facility]  Philipp Deputy HPI: This is a 56 year old male with a PMH of erosive esophagitis with bleeding, ETOH cirrhosis, GERD, anoxic brain injury, ETOH abuse, HTN, and cardiac arrest with PEA admitted for complaints of near syncope.  The patient states that he had issues with falls of late and a sensation of fainting.  Work up in the ER was negative for any cardiac or neurologic source, but he was noted to be anemic.  There is a history of chronic anemia, but his values in the ER were less than the baseline (9-10 g/dL) at 7.6 g/dL.  In the past he was evaluated with an EGD by Drs. Dorothyann Gibbs, and Marina Goodell.  All of the procedures showed an erosive esophagitis and he presented with various complaints of hematemesis and/or melena.  There was no evidence of any esophageal or fundic varcies.  During the last EGD with Dr. Marina Goodell on 11/01/2020, he had extensive erosions in the proximal duodenum.  The patient was supposed to follow up with Dr. Russella Dar as an outpatient, but he was never able to make the appointment.  The patient currently denies any problems with hematochezia, melena, hematemesis, or abdominal pain.  Past Medical History:  Diagnosis Date   Acute renal insufficiency 12/08/2012   AKI (acute kidney injury) (HCC)    ALCOHOL ABUSE, HX OF 11/10/2007   Anoxic encephalopathy (HCC)    Anxiety    Bleeding ulcer 2014   Blood transfusion 2014   "related to bleeding ulcer"   Cardiac arrest (HCC) 12/07/2012   Anoxic encephalopathy   GERD (gastroesophageal reflux disease)    High cholesterol    Hypertension    OSA on CPAP 12/07/2012   Seizure-like activity (HCC)    Seizures (HCC)    last seizure 01/05/2021   Type II diabetes mellitus (HCC)    Venous insufficiency 12/13/2011    Past Surgical History:  Procedure Laterality Date   BIOPSY  11/01/2020   Procedure: BIOPSY;  Surgeon: Hilarie Fredrickson, MD;  Location: [facility] ENDOSCOPY;  Service: Endoscopy;;    CARDIOVASCULAR STRESS TEST  03/16/13   Very Poor Exercise Tolerance; NON DIAGNOSTIC TEST   CATARACT EXTRACTION W/ INTRAOCULAR LENS IMPLANT Left 03/07/2014   [place] @ [facility]   ESOPHAGOGASTRODUODENOSCOPY Left 11/26/2012   Procedure: ESOPHAGOGASTRODUODENOSCOPY (EGD);  Surgeon: Graylin Shiver, MD;  Location: [facility] ENDOSCOPY;  Service: Endoscopy;  Laterality: Left;   ESOPHAGOGASTRODUODENOSCOPY N/A 10/10/2014   Procedure: ESOPHAGOGASTRODUODENOSCOPY (EGD);  Surgeon: Meryl Dare, MD;  Location[facility] ENDOSCOPY;  Service: Endoscopy;  Laterality: N/A;   ESOPHAGOGASTRODUODENOSCOPY (EGD) WITH PROPOFOL N/A 11/01/2020   Procedure: ESOPHAGOGASTRODUODENOSCOPY (EGD) WITH PROPOFOL;  Surgeon: Hilarie Fredrickson, MD;  Location: [facility] ENDOSCOPY;  Service: Endoscopy;  Laterality: N/A;   KNEE ARTHROSCOPY Left ~ 1999   UPPER GASTROINTESTINAL ENDOSCOPY      Family History  Problem Relation Age of Onset   Other Mother        Unsure of medical history.   Diabetes Mellitus II Father    Colon polyps Neg Hx    Colon cancer Neg Hx    Esophageal cancer Neg Hx    Rectal cancer Neg Hx    Stomach cancer Neg Hx     Social History:  reports that he has never smoked. His smokeless tobacco use includes snuff. He reports current alcohol use. He reports that he does not use drugs.  Allergies:  Allergies  Allergen Reactions   Atorvastatin Other (  See Comments)    Cause Muscle Weakness   Drug Ingredient [Spironolactone] Other (See Comments)    Hyperkalemia   Lisinopril Other (See Comments)    Pt reports nose bleeds   Losartan Other (See Comments)    Patient stopped taking this because he felt like passing out after standing up    Medications: Scheduled:  aspirin EC  81 mg Oral QHS   DULoxetine  30 mg Oral QHS   enoxaparin (LOVENOX) injection  40 mg Subcutaneous Q24H   feeding supplement  237 mL Oral TID BM   folic acid  1 mg Oral QHS   gabapentin  900 mg Oral TID   [START ON 12/22/2021] insulin aspart  0-9 Units  Subcutaneous TID WC   levETIRAcetam  1,000 mg Oral BID   multivitamin with minerals  1 tablet Oral Daily   pantoprazole  40 mg Oral QHS   polyethylene glycol-electrolytes  4,000 mL Oral Once   thiamine  100 mg Oral QHS   Continuous:  lactated ringers 125 mL/hr at 12/21/21 1319    Results for orders placed or performed during the hospital encounter of 12/20/21 (from the past 24 hour(s))  Troponin I (High Sensitivity)     Status: None   Collection Time: 12/20/21  7:15 PM  Result Value Ref Range   Troponin I (High Sensitivity) 15 <18 ng/L  CBG monitoring, ED     Status: Abnormal   Collection Time: 12/20/21  7:42 PM  Result Value Ref Range   Glucose-Capillary 273 (H) 70 - 99 mg/dL  Ethanol     Status: None   Collection Time: 12/20/21  9:19 PM  Result Value Ref Range   Alcohol, Ethyl (B) <10 <10 mg/dL  CBG monitoring, ED     Status: Abnormal   Collection Time: 12/20/21 10:04 PM  Result Value Ref Range   Glucose-Capillary 181 (H) 70 - 99 mg/dL  CBC     Status: Abnormal   Collection Time: 12/21/21  4:19 AM  Result Value Ref Range   WBC 7.0 4.0 - 10.5 K/uL   RBC 3.16 (L) 4.22 - 5.81 MIL/uL   Hemoglobin 7.8 (L) 13.0 - 17.0 g/dL   HCT 52.8 (L) 41.3 - 24.4 %   MCV 72.5 (L) 80.0 - 100.0 fL   MCH 24.7 (L) 26.0 - 34.0 pg   MCHC 34.1 30.0 - 36.0 g/dL   RDW 01.0 (H) 27.2 - 53.6 %   Platelets 325 150 - 400 K/uL   nRBC 0.0 0.0 - 0.2 %  Basic metabolic panel     Status: Abnormal   Collection Time: 12/21/21  4:19 AM  Result Value Ref Range   Sodium 142 135 - 145 mmol/L   Potassium 4.4 3.5 - 5.1 mmol/L   Chloride 99 98 - 111 mmol/L   CO2 32 22 - 32 mmol/L   Glucose, Bld 28 (LL) 70 - 99 mg/dL   BUN 18 6 - 20 mg/dL   Creatinine, Ser 6.44 (H) 0.61 - 1.24 mg/dL   Calcium 7.7 (L) 8.9 - 10.3 mg/dL   GFR, Estimated 49 (L) >60 mL/min   Anion gap 11 5 - 15  Glucose, capillary     Status: Abnormal   Collection Time: 12/21/21  5:39 AM  Result Value Ref Range   Glucose-Capillary 22 (LL) 70 -  99 mg/dL  Glucose, capillary     Status: Abnormal   Collection Time: 12/21/21  6:10 AM  Result Value Ref Range   Glucose-Capillary 221 (H)  70 - 99 mg/dL  Glucose, capillary     Status: Abnormal   Collection Time: 12/21/21  6:34 AM  Result Value Ref Range   Glucose-Capillary 124 (H) 70 - 99 mg/dL  Glucose, capillary     Status: Abnormal   Collection Time: 12/21/21  7:24 AM  Result Value Ref Range   Glucose-Capillary 108 (H) 70 - 99 mg/dL  Glucose, capillary     Status: Abnormal   Collection Time: 12/21/21 11:58 AM  Result Value Ref Range   Glucose-Capillary 171 (H) 70 - 99 mg/dL  Urinalysis, Routine w reflex microscopic     Status: Abnormal   Collection Time: 12/21/21  3:24 PM  Result Value Ref Range   Color, Urine YELLOW YELLOW   APPearance HAZY (A) CLEAR   Specific Gravity, Urine 1.013 1.005 - 1.030   pH 5.0 5.0 - 8.0   Glucose, UA 50 (A) NEGATIVE mg/dL   Hgb urine dipstick LARGE (A) NEGATIVE   Bilirubin Urine NEGATIVE NEGATIVE   Ketones, ur NEGATIVE NEGATIVE mg/dL   Protein, ur 782 (A) NEGATIVE mg/dL   Nitrite NEGATIVE NEGATIVE   Leukocytes,Ua NEGATIVE NEGATIVE   RBC / HPF >50 (H) 0 - 5 RBC/hpf   WBC, UA 6-10 0 - 5 WBC/hpf   Bacteria, UA FEW (A) NONE SEEN   Squamous Epithelial / LPF 0-5 0 - 5   Mucus PRESENT    Hyaline Casts, UA PRESENT   Glucose, capillary     Status: Abnormal   Collection Time: 12/21/21  4:33 PM  Result Value Ref Range   Glucose-Capillary 309 (H) 70 - 99 mg/dL     DG Chest Portable 1 View  Result Date: 12/20/2021 CLINICAL DATA:  Dizziness. EXAM: PORTABLE CHEST 1 VIEW COMPARISON:  September 13, 2021 FINDINGS: The heart size and mediastinal contours are within normal limits. Both lungs are clear. The visualized skeletal structures are unremarkable. IMPRESSION: No active disease. Electronically Signed   By: Aram Candela M.D.   On: 12/20/2021 17:25    ROS:  As stated above in the HPI otherwise negative.  Blood pressure (!) 142/95, pulse (!) 105,  temperature (!) 97.5 F (36.4 C), temperature source Oral, resp. rate 18, height 5\' 10"  (1.778 m), weight 102.1 kg, SpO2 100 %.    PE: Gen: NAD, Alert and Oriented HEENT:  /AT, EOMI Neck: Supple, no LAD Lungs: CTA Bilaterally CV: RRR without M/G/R ABD: Soft, NTND, +BS Ext: No C/C/E  Assessment/Plan: 1) IDA. 2) History of erosive esophagitis. 3) Cirrhosis. 4) Multiple medical problems.   The patient does have a worsened anemia.  He has a clear history for an upper GI source, but it is reasonable to perform a colonoscopy with his worsened anemia.  He has known ETOH cirrhosis and the last imaging was performed one year ago.  A follow up RUQ U/S is necessary for screening for HCC.  His admission ETOH level was normal.  Plan: 1) EGD/Colonoscopy with Dr. Lavon Paganini tomorrow.  Jaquanna Ballentine D 12/21/2021, 5:13 PM

## 2021-12-21 NOTE — Evaluation (Signed)
Physical Therapy Evaluation ?Patient Details ?Name: Jeffrey [name] ?MRN: [id number] ?DOB: December 28, 1965 ?Today's Date: 12/21/2021 ? ?History of Present Illness ? 56 y.o. male presenting with dizziness and falls for past few weeks. PMH is significant for HTN, HLD, CKD, MI, cardiac arrest, anoxic brain injury, T2DM, GERD, cirrhosis, Depression, anemia of chronic disease  ?Clinical Impression ? Patient admitted with the above. Patient presents with generalized weakness, impaired balance, and decreased activity tolerance. Patient currently with +orthostatics despite IV fluids and TED hose, see below. Patient required min guard for sit to stand transfer to obtain orthostatics but deferred further mobility due to onset of symptoms of pre-syncope. Patient will benefit from skilled PT services during acute stay to address listed deficits. Recommend OPPT at discharge to address strength and balance deficits.  ? ?Orthostatic BPs ? ?Reclined in chair 171/111  ?Sitting 144/107  ?Standing 119/86  ?Standing after 3 min 91/73  ?Sitting 140/117  ? ?   ?   ? ?Recommendations for follow up therapy are one component of a multi-disciplinary discharge planning process, led by the attending physician.  Recommendations may be updated based on patient status, additional functional criteria and insurance authorization. ? ?Follow Up Recommendations Outpatient PT ? ?  ?Assistance Recommended at Discharge Intermittent Supervision/Assistance  ?Patient can return home with the following ?   ? ?  ?Equipment Recommendations None recommended by PT  ?Recommendations for Other Services ?    ?  ?Functional Status Assessment Patient has had a recent decline in their functional status and demonstrates the ability to make significant improvements in function in a reasonable and predictable amount of time.  ? ?  ?Precautions / Restrictions Precautions ?Precautions: Fall;Other (comment) ?Precaution Comments: monitor orthostatics ?Restrictions ?Weight Bearing  Restrictions: No  ? ?  ? ?Mobility ? Bed Mobility ?  ?  ?  ?  ?  ?  ?  ?General bed mobility comments: in recliner on arrival ?  ? ?Transfers ?Overall transfer level: Needs assistance ?Equipment used: Rolling Caidyn Blossom (2 wheels) ?Transfers: Sit to/from Stand ?Sit to Stand: Min guard ?  ?  ?  ?  ?  ?General transfer comment: use of momentum and increased time to complete ?  ? ?Ambulation/Gait ?  ?  ?  ?  ?  ?  ?  ?General Gait Details: deferred due to + orthostatics ? ?Stairs ?  ?  ?  ?  ?  ? ?Wheelchair Mobility ?  ? ?Modified Rankin (Stroke Patients Only) ?  ? ?  ? ?Balance Overall balance assessment: Needs assistance, History of Falls ?Sitting-balance support: No upper extremity supported, Feet supported ?Sitting balance-Leahy Scale: Good ?  ?  ?Standing balance support: Bilateral upper extremity supported, During functional activity ?Standing balance-Leahy Scale: Fair ?Standing balance comment: able to stand without support, noted to reach for RW after prolonged standing due to onset of syncopal symptoms ?  ?  ?  ?  ?  ?  ?  ?  ?  ?  ?  ?   ? ? ? ?Pertinent Vitals/Pain Pain Assessment ?Pain Assessment: No/denies pain  ? ? ?Home Living Family/patient expects to be discharged to:: Private residence ?Living Arrangements: Children ?Available Help at Discharge: Family ?Type of Home: House ?Home Access: Stairs to enter ?Entrance Stairs-Rails: Right ?Entrance Stairs-Number of Steps: 3 ?  ?Home Layout: One level ?Home Equipment: Merchant navy officer (4 wheels);Adaptive equipment;Tub bench;Rolling Lesbia [place]taway (2 wheels) ?   ?  ?Prior Function Prior Level of Function : Independent/Modified Independent ?  ?  ?  ?  ?  ?  ?  Mobility Comments: uses a rollator outdoors; 4-5 falls in past 2 weeks due to syncopal episodes ?ADLs Comments: Modified Independent with ADLs, reports driving and grocery shopping. Pt reports progressing beyond using tub transfer bench and able to step over and stand in showers ?  ? ? ?Hand Dominance  ?   ? ?   ?Extremity/Trunk Assessment  ? Upper Extremity Assessment ?Upper Extremity Assessment: Defer to OT evaluation ?  ? ?Lower Extremity Assessment ?Lower Extremity Assessment: Generalized weakness ?  ? ?Cervical / Trunk Assessment ?Cervical / Trunk Assessment: Normal  ?Communication  ? Communication: No difficulties  ?Cognition Arousal/Alertness: Awake/alert ?Behavior During Therapy: [facility] for tasks assessed/performed ?Overall Cognitive Status: History of cognitive impairments - at baseline ?  ?  ?  ?  ?  ?  ?  ?  ?  ?  ?  ?  ?  ?  ?  ?  ?General Comments: hx of anoxic brain injury; pleasant and appropriate throughout session; shows insight into deficits and safety concerns ?  ?  ? ?  ?General Comments   ? ?  ?Exercises    ? ?Assessment/Plan  ?  ?PT Assessment Patient needs continued PT services  ?PT Problem List Decreased strength;Decreased balance;Decreased mobility;Decreased activity tolerance ? ?   ?  ?PT Treatment Interventions Gait training;DME instruction;Stair training;Functional mobility training;Therapeutic activities;Therapeutic exercise;Balance training;Patient/family education   ? ?PT Goals (Current goals can be found in the Care Plan section)  ?Acute Rehab PT Goals ?Patient Stated Goal: to figure out my BP ?PT Goal Formulation: With patient ?Time For Goal Achievement: 01/04/22 ?Potential to Achieve Goals: Good ? ?  ?Frequency Min 3X/week ?  ? ? ?Co-evaluation   ?  ?  ?  ?  ? ? ?  ?AM-PAC PT "6 Clicks" Mobility  ?Outcome Measure Help needed turning from your back to your side while in a flat bed without using bedrails?: None ?Help needed moving from lying on your back to sitting on the side of a flat bed without using bedrails?: None ?Help needed moving to and from a bed to a chair (including a wheelchair)?: A Little ?Help needed standing up from a chair using your arms (e.g., wheelchair or bedside chair)?: A Little ?Help needed to walk in hospital room?: A Little ?Help needed climbing 3-5 steps with a  railing? : Total ?6 Click Score: 18 ? ?  ?End of Session Equipment Utilized During Treatment: Gait belt ?Activity Tolerance: Patient tolerated treatment well ?Patient left: in chair;with call bell/phone within reach;with chair alarm set ?Nurse Communication: Mobility status ?PT Visit Diagnosis: Unsteadiness on feet (R26.81);Muscle weakness (generalized) (M62.81);History of falling (Z91.81) ?  ? ?Time: 1429-1501 ?PT Time Calculation (min) (ACUTE ONLY): 32 min ? ? ?Charges:   PT Evaluation ?$PT Eval Moderate Complexity: 1 Mod ?PT Treatments ?$Therapeutic Activity: 8-22 mins ?  ?   ? ? ?Tomasita Beevers A. Gilford Rile, PT, DPT ?Acute Rehabilitation Services ?Pager [phone] ?Office [phone] ? ? ?Indiyah Paone A Marlyss Cissell ?12/21/2021, 3:39 PM ? ?

## 2021-12-22 ENCOUNTER — Inpatient Hospital Stay (HOSPITAL_COMMUNITY): Payer: Commercial Managed Care - HMO | Admitting: Anesthesiology

## 2021-12-22 ENCOUNTER — Encounter (HOSPITAL_COMMUNITY): Payer: Self-pay | Admitting: Family Medicine

## 2021-12-22 ENCOUNTER — Encounter (HOSPITAL_COMMUNITY)
Admission: EM | Disposition: A | Discharge status: Home / Self Care | Payer: Self-pay | Source: Home / Self Care | Attending: Family Medicine

## 2021-12-22 DIAGNOSIS — D509 Iron deficiency anemia, unspecified: Secondary | ICD-10-CM | POA: Diagnosis not present

## 2021-12-22 DIAGNOSIS — Z794 Long term (current) use of insulin: Secondary | ICD-10-CM

## 2021-12-22 DIAGNOSIS — D649 Anemia, unspecified: Secondary | ICD-10-CM | POA: Diagnosis not present

## 2021-12-22 DIAGNOSIS — I10 Essential (primary) hypertension: Secondary | ICD-10-CM

## 2021-12-22 DIAGNOSIS — R739 Hyperglycemia, unspecified: Secondary | ICD-10-CM | POA: Diagnosis not present

## 2021-12-22 DIAGNOSIS — R55 Syncope and collapse: Secondary | ICD-10-CM | POA: Diagnosis not present

## 2021-12-22 DIAGNOSIS — E119 Type 2 diabetes mellitus without complications: Secondary | ICD-10-CM

## 2021-12-22 DIAGNOSIS — D500 Iron deficiency anemia secondary to blood loss (chronic): Secondary | ICD-10-CM

## 2021-12-22 DIAGNOSIS — N179 Acute kidney failure, unspecified: Secondary | ICD-10-CM | POA: Diagnosis not present

## 2021-12-22 HISTORY — PX: ESOPHAGOGASTRODUODENOSCOPY (EGD) WITH PROPOFOL: SHX5813

## 2021-12-22 LAB — CBC
HCT: 23.1 % — ABNORMAL LOW (ref 39.0–52.0)
Hemoglobin: 7.3 g/dL — ABNORMAL LOW (ref 13.0–17.0)
MCH: 23.4 pg — ABNORMAL LOW (ref 26.0–34.0)
MCHC: 31.6 g/dL (ref 30.0–36.0)
MCV: 74 fL — ABNORMAL LOW (ref 80.0–100.0)
Platelets: 300 10*3/uL (ref 150–400)
RBC: 3.12 MIL/uL — ABNORMAL LOW (ref 4.22–5.81)
RDW: 27.3 % — ABNORMAL HIGH (ref 11.5–15.5)
WBC: 6.9 10*3/uL (ref 4.0–10.5)
nRBC: 0 % (ref 0.0–0.2)

## 2021-12-22 LAB — BASIC METABOLIC PANEL
Anion gap: 9 (ref 5–15)
BUN: 14 mg/dL (ref 6–20)
CO2: 27 mmol/L (ref 22–32)
Calcium: 7.1 mg/dL — ABNORMAL LOW (ref 8.9–10.3)
Chloride: 100 mmol/L (ref 98–111)
Creatinine, Ser: 1.2 mg/dL (ref 0.61–1.24)
GFR, Estimated: >60 mL/min (ref >60)
Glucose, Bld: 144 mg/dL — ABNORMAL HIGH (ref 70–99)
Potassium: 4.8 mmol/L (ref 3.5–5.1)
Sodium: 136 mmol/L (ref 135–145)

## 2021-12-22 LAB — GLUCOSE, CAPILLARY
Glucose-Capillary: 156 mg/dL — ABNORMAL HIGH (ref 70–99)
Glucose-Capillary: 127 mg/dL — ABNORMAL HIGH (ref 70–99)
Glucose-Capillary: 192 mg/dL — ABNORMAL HIGH (ref 70–99)
Glucose-Capillary: 252 mg/dL — ABNORMAL HIGH (ref 70–99)

## 2021-12-22 LAB — HEMOGLOBIN AND HEMATOCRIT, BLOOD
HCT: 27.3 % — ABNORMAL LOW (ref 39.0–52.0)
Hemoglobin: 8.7 g/dL — ABNORMAL LOW (ref 13.0–17.0)

## 2021-12-22 LAB — PARATHYROID HORMONE, INTACT (NO CA): PTH: 46 pg/mL (ref 15–65)

## 2021-12-22 LAB — PREPARE RBC (CROSSMATCH)

## 2021-12-22 SURGERY — ESOPHAGOGASTRODUODENOSCOPY (EGD) WITH PROPOFOL
Anesthesia: Monitor Anesthesia Care

## 2021-12-22 MED ORDER — LACTATED RINGERS IV SOLN
INTRAVENOUS | Status: DC
Start: 2021-12-22 — End: 2021-12-22

## 2021-12-22 MED ORDER — PROPOFOL 10 MG/ML IV BOLUS
INTRAVENOUS | Status: DC | PRN
  Administered 2021-12-22: 50 mg via INTRAVENOUS

## 2021-12-22 MED ORDER — LABETALOL HCL 5 MG/ML IV SOLN
INTRAVENOUS | Status: AC
  Filled 2021-12-22: qty 4

## 2021-12-22 MED ORDER — PEG-KCL-NACL-NASULF-NA ASC-C 100 G PO SOLR
0.5000 | Freq: Once | ORAL | Status: AC
  Administered 2021-12-22: 100 g via ORAL

## 2021-12-22 MED ORDER — SODIUM CHLORIDE 0.9 % IV SOLN: INTRAVENOUS | Status: DC

## 2021-12-22 MED ORDER — SODIUM CHLORIDE 0.9% IV SOLUTION: Freq: Once | INTRAVENOUS | Status: DC

## 2021-12-22 MED ORDER — LABETALOL HCL 5 MG/ML IV SOLN
10.0000 mg | Freq: Once | INTRAVENOUS | Status: AC
  Administered 2021-12-22: 10 mg via INTRAVENOUS

## 2021-12-22 MED ORDER — PEG-KCL-NACL-NASULF-NA ASC-C 100 G PO SOLR
1.0000 | Freq: Once | ORAL | Status: DC
Start: 2021-12-22 — End: 2021-12-22

## 2021-12-22 MED ORDER — PEG-KCL-NACL-NASULF-NA ASC-C 100 G PO SOLR
0.5000 | Freq: Once | ORAL | Status: AC
  Administered 2021-12-22: 100 g via ORAL
  Filled 2021-12-22: qty 1

## 2021-12-22 MED ORDER — PROPOFOL 500 MG/50ML IV EMUL
INTRAVENOUS | Status: DC | PRN
  Administered 2021-12-22: 60 ug/kg/min via INTRAVENOUS

## 2021-12-22 SURGICAL SUPPLY — 25 items

## 2021-12-22 NOTE — Progress Notes (Signed)
FPTS Brief Progress Note ? ?S Saw patient at bedside this evening. Patient was sitting on the bed comfortably, drinking bowel prep for tomorrow. No concerns reported. ? ?O: ?BP (!) 122/107 (BP Location: Left Arm)   Pulse 98   Temp 97.6 ?F (36.4 ?C)   Resp 17   Ht '5\' 10"'$  (1.778 m)   Wt 102.1 kg   SpO2 100%   BMI 32.28 kg/m?   ? ?General: Alert, no acute distress ?Cardio: well perfused ?Pulm: normal work of breathing ?Neuro: Cranial nerves grossly intact  ? ?A/P: ?Plan per day team  ?-Colonoscopy am  ?- Orders reviewed. Labs for AM ordered, which was adjusted as needed.  ?- If condition changes, plan includes transfusion RBC.  ? ?Lattie Haw, MD ?12/22/2021, 10:40 PM ?PGY-3, [place] Night Resident  ?Please page [phone] with questions.   ?

## 2021-12-22 NOTE — Anesthesia Procedure Notes (Signed)
Procedure Name: [place] ?Date/Time: 12/22/2021 11:28 AM ?Performed by: Lorie Phenix, CRNA ?Pre-anesthesia Checklist: Patient identified, Emergency Drugs available, Suction available and Patient being monitored ?Oxygen Delivery Method: Nasal cannula ?Placement Confirmation: positive ETCO2 ? ? ? ? ?

## 2021-12-22 NOTE — Progress Notes (Signed)
Two IV consults were placed to IV Therapy this shift;  IV Team had restarted an IV in the pt's right arm this morning, however, it also infiltrated;  Left arm assessed with ultrasound again at 1800, but no suitable veins were noted; still has edema noted in the left arm;  pt not receiving any IV medications at this time;  will see if swelling recedes and re-assess in the am.  RN to place new consult  in am.   ?

## 2021-12-22 NOTE — Progress Notes (Addendum)
Family Medicine Teaching Service ?Daily Progress Note ?Intern Pager: [phone] ? ?Patient name: Jeffrey [name] Medical record number: [id number] ?Date of birth: June 25, 1966 Age: 56 y.o. Gender: male ? ?Primary Care Provider: [name], MD ?Consultants: None ?Code Status: Full ? ?Pt Overview and Major Events to Date:  ?3/16: Admitted ? ?Assessment and Plan: ?Jeffrey [name] is a 56 year-old male who was admitted for generalized weakness, falls in the setting of recently starting losartan, hypoglycemia, anemia and chronic poor appetite.  PMH significant for hypertension, hyperlipidemia, CKD, MI, cardiac arrest, anoxic brain injury, Q7HA, GERD, alcoholic liver cirrhosis, depression, anemia of chronic disease. ? ? ?Generalized weakness and falls, + orthostatics and acute hypotension with history of hypertension ?BP elevated this morning it 130s-160s/90s-110s.  Given his profoundly orthostatic hypotension we are permitting for elevated blood pressure otherwise.  We are continuing to hold losartan.  Per the dietitian, he does not meet criteria for malnutrition but does have some mild depletion of muscle and subcutaneous fat mass.  His inadequate oral intake related to poor appetite will be supplemented with Ensure 3 times daily.  Patient worked with PT, who recommended continued PT services while inpatient and then outpatient PT at discharge.  I believe he will continue to improve with improved nutrition, working with PT to prevent any further deconditioning, inpatient GI work-up for anemia.  Another potential change in management would be to decrease gabapentin dose, as he is on a pretty large home dose which increases adverse effects that could be contributing to this current issue.   ?-Ensure alive 3 times daily, MVI daily. ?-PT/OT ?-TED hose ?-Monitor vital signs ?-Fall precautions ?-We will DC IV fluids today and monitor I's and O's closely ? ?T2DM ?Treading lightly with insulin given severe hypoglycemia  yesterday, and very low food intake.  However, he had hyperglycemia up to 414 overnight and was given 5 units NovoLog.  CBGs in the 100s to 200s this morning.  Would like to avoid hypoglycemia and will continue with the very sensitive sliding scale for now. ?-CBG 4 times daily, before meals and at bedtime ?-Very sensitive sliding scale insulin ? ?Iron deficiency anemia with history of anemia of chronic disease ?Alcoholic liver cirrhosis ?History of erosive esophagitis ?Worsening.  Hgb continuing to drop to 7.3 from 7.6 yesterday. Discussed blood transfusion this morning with patient, and he consented to transfusion.  Continues to deny any melena, hematemesis, abdominal pain. GI consulted and will perform an EGD/colonoscopy, as he has a clear history for an upper GI source.  Greatly appreciate their care for this patient we will follow-up on those results.  For now we will just monitor hemoglobin. ?Of note: Prior EGD showed erosive esophagitis, and has presented with hematemesis and/or melena in the past.  EGD on 11/01/2020 showed extensive erosions in the proximal duodenum. ?- Transfuse 1u pRBC ?- F/u post-transfusion H/H ?-GI following, appreciate their care ?-Transfusion threshold <8.0 given cardiac history ? ?AKI on CKD stage IIIa, improving ?Creatinine 1.20, down from 1.63 yesterday.  Baseline around 1.  Given that this is improving s/p IVF, likely prerenal.  Losartan also likely contributor, which has been held. UA showed >50 RBC, but no gross hematuria per patient. Will recommend outpatient follow-up for this. ?-AM BMP ?-Strict I/os ?-Avoid nephrotoxic medications ? ? ?FEN/GI: Clear liquid diet, in preparation for EGD/colonoscopy ?PPx: SCDs, high bleeding risk ?Dispo:Home with home health  pending clinical improvement . Barriers include further inpatient work-up, orthostatic hypotension.  ? ?Subjective:  ?Jeffrey [name] had many bowel movements  he called "episodes" throughout the night. Has agreed to blood  transfusion but believes it caused his cardiac arrest years ago after he received one. He denies any pain. Felt weak yesterday after bowel prep for colonoscopy. ? ?Objective: ?Temp:  [97.1 ?F (36.2 ?C)-98.6 ?F (37 ?C)] 98.6 ?F (72 ?C) (03/17 2011) ?Pulse Rate:  [76-160] 92 (03/17 2011) ?Resp:  [18] 18 (03/17 2011) ?BP: (132-160)/(94-121) 160/121 (03/17 2011) ?SpO2:  [100 %] 100 % (03/17 2011) ?Physical Exam: ?General: Sitting up in [place] getting cleaned by RN  ?Cardiovascular: Regular rate ?Respiratory: Normal work of breathing on room air ?Abdomen: Soft, non-distended ?Extremities: Warm, dry ? ?Laboratory: ?Recent Labs  ?Lab 12/20/21 ?1547 12/20/21 ?1600 12/21/21 ?0419 12/22/21 ?0732  ?WBC 4.7  --  7.0 6.9  ?HGB 7.6* 9.2* 7.8* 7.3*  ?HCT 23.5* 27.0* 22.9* 23.1*  ?PLT 344  --  325 300  ? ?Recent Labs  ?Lab 12/20/21 ?1547 12/20/21 ?1600 12/21/21 ?0419 12/22/21 ?0732  ?NA 137 137 142 136  ?K 4.8 4.7 4.4 4.8  ?CL 97*  --  99 100  ?CO2 28  --  32 27  ?BUN 18  --  18 14  ?CREATININE 1.83*  --  1.63* 1.20  ?CALCIUM 7.1*  --  7.7* 7.1*  ?PROT 5.7*  --   --   --   ?BILITOT 0.5  --   --   --   ?ALKPHOS 237*  --   --   --   ?ALT 42  --   --   --   ?AST 44*  --   --   --   ?GLUCOSE 335*  --  28* 144*  ? ? ?Imaging/Diagnostic Tests: ?No results found. ? ? ?Orvis Brill, DO ?12/22/2021, 8:30 AM ?PGY-1, [place] ?[place] Intern pager: [phone], text pages welcome ? ?

## 2021-12-22 NOTE — Interval H&P Note (Deleted)
History and Physical Interval Note: ? ?12/22/2021 ?10:57 AM ? ?Jeffrey [name]  has presented today for surgery, with the diagnosis of IDA.  The various methods of treatment have been discussed with the patient and family. After consideration of risks, benefits and other options for treatment, the patient has consented to  Procedure(s): ? ?ESOPHAGOGASTRODUODENOSCOPY (EGD) WITH PROPOFOL (N/A) as a surgical intervention.  The patient's history has been reviewed, patient examined, no change in status, stable for surgery.  I have reviewed the patient's chart and labs.  Questions were answered to the patient's satisfaction.   ? ?Patient did not complete bowel prep, will defer colonoscopy to Monday if remains hospitalized or will be done outpatient, to be scheduled with Dr. Benson Norway ? ? ?Kori Goins ? ? ?

## 2021-12-22 NOTE — Op Note (Signed)
[facility] ?Patient Name: Jeffrey [name] ?Procedure Date : 12/22/2021 ?MRN: [id number] ?Attending MD: Mauri Pole , MD ?Date of Birth: October 02, 1966 ?CSN: [id number] ?Age: 56 ?Admit Type: Inpatient ?Procedure:                Upper GI endoscopy ?Indications:              Suspected upper gastrointestinal bleeding in  ?                          patient with unexplained iron deficiency anemia ?Providers:                Mauri Pole, MD, Dulcy Fanny, Myrlene Broker  ?                          Pearlie Oyster, Technician ?Referring MD:              ?Medicines:                Monitored Anesthesia Care ?Complications:            No immediate complications. ?Estimated Blood Loss:     Estimated blood loss was minimal. ?Procedure:                Pre-Anesthesia Assessment: ?                          - Prior to the procedure, a History and Physical  ?                          was performed, and patient medications and  ?                          allergies were reviewed. The patient's tolerance of  ?                          previous anesthesia was also reviewed. The risks  ?                          and benefits of the procedure and the sedation  ?                          options and risks were discussed with the patient.  ?                          All questions were answered, and informed consent  ?                          was obtained. Prior Anticoagulants: The patient has  ?                          taken no previous anticoagulant or antiplatelet  ?                          agents. ASA Grade Assessment: III - A patient with  ?  severe systemic disease. After reviewing the risks  ?                          and benefits, the patient was deemed in  ?                          satisfactory condition to undergo the procedure. ?                          After obtaining informed consent, the endoscope was  ?                          passed under direct vision. Throughout the  ?                           procedure, the patient's blood pressure, pulse, and  ?                          oxygen saturations were monitored continuously. The  ?                          [id number]-[id number] ([id number] Olympus endoscope was introduced  ?                          through the mouth, and advanced to the second part  ?                          of duodenum. The upper GI endoscopy was  ?                          accomplished without difficulty. The patient  ?                          tolerated the procedure well. ?Scope In: ?Scope Out: ?Findings: ?     No gross lesions were noted in the entire esophagus. ?     The stomach was normal. ?     The examined duodenum was normal. ?Impression:               - No gross lesions in esophagus. ?                          - Normal stomach. ?                          - Normal examined duodenum. ?                          - No specimens collected. ?Recommendation:           - Patient has a contact number available for  ?                          emergencies. The signs and symptoms of potential  ?  delayed complications were discussed with the  ?                          patient. Return to normal activities tomorrow.  ?                          Written discharge instructions were provided to the  ?                          patient. ?                          - Clear liquid diet. ?                          - Continue present medications. ?                          - Bowel prep and plan for colonoscopy tomorrow AM ?                          - NPO after midnight ?                          - Monitor Hgb and transfuse if below 7 ?Procedure Code(s):        --- Professional --- ?                          [phone], Esophagogastroduodenoscopy, flexible,  ?                          transoral; diagnostic, including collection of  ?                          specimen(s) by brushing or washing, when performed  ?                          (separate procedure) ?Diagnosis Code(s):        ---  Professional --- ?                          D50.9, Iron deficiency anemia, unspecified ?CPT copyright 2019 American Medical Association. All rights reserved. ?The codes documented in this report are preliminary and upon coder review may  ?be revised to meet current compliance requirements. ?Mauri Pole, MD ?12/22/2021 11:57:19 AM ?This report has been signed electronically. ?Number of Addenda: 0 ?

## 2021-12-22 NOTE — Anesthesia Postprocedure Evaluation (Signed)
Anesthesia Post Note ? ?Patient: Jeffrey [name] ? ?Procedure(s) Performed: ESOPHAGOGASTRODUODENOSCOPY (EGD) WITH PROPOFOL ? ?  ? ?Patient location during evaluation: Endoscopy ?Anesthesia Type: MAC ?Level of consciousness: awake and alert ?Pain management: pain level controlled ?Vital Signs Assessment: post-procedure vital signs reviewed and stable ?Respiratory status: spontaneous breathing, nonlabored ventilation, respiratory function stable and patient connected to nasal cannula oxygen ?Cardiovascular status: stable and blood pressure returned to baseline ?Postop Assessment: no apparent nausea or vomiting ?Anesthetic complications: no ? ? ?No notable events documented. ? ?Last Vitals:  ?Vitals:  ? 12/22/21 1800 12/22/21 2035  ?BP: (!) 145/96 (!) 122/107  ?Pulse: (!) 104 98  ?Resp: 18 17  ?Temp: (!) 36.3 ?C 36.4 ?C  ?SpO2: 99% 100%  ?  ?Last Pain:  ?Vitals:  ? 12/22/21 1943  ?TempSrc:   ?PainSc: 0-No pain  ? ? ?  ?  ?  ?  ?  ?  ? ?Santa Lighter ? ? ? ? ?

## 2021-12-22 NOTE — Anesthesia Preprocedure Evaluation (Addendum)
Anesthesia Evaluation  ?Patient identified by MRN, date of birth, ID band ?Patient awake ? ? ? ?Reviewed: ?Allergy & Precautions, NPO status , Patient's Chart, lab work & pertinent test results ? ?Airway ?Mallampati: III ? ?TM Distance: >3 FB ?Neck ROM: Full ? ? ? Dental ? ?(+) Dental Advisory Given, Edentulous Upper ?  ?Pulmonary ?sleep apnea ,  ?  ?Pulmonary exam normal ?breath sounds clear to auscultation ? ? ? ? ? ? Cardiovascular ?hypertension, Pt. on medications ?Normal cardiovascular exam ?Rhythm:Regular Rate:Normal ? ? ?  ?Neuro/Psych ?Seizures -,  PSYCHIATRIC DISORDERS Anxiety Depression   ? GI/Hepatic ?PUD, GERD  Medicated,  ?Endo/Other  ?diabetes, Type 2, Insulin Dependent, Oral Hypoglycemic AgentsObesity ? ? Renal/GU ?Renal InsufficiencyRenal disease  ? ?  ?Musculoskeletal ? ? Abdominal ?  ?Peds ? Hematology ? ?(+) Blood dyscrasia, anemia ,   ?Anesthesia Other Findings ? ? Reproductive/Obstetrics ? ?  ? ? ? ? ? ? ? ? ? ? ? ? ? ?  ?  ? ? ? ? ? ? ? ?Anesthesia Physical ?Anesthesia Plan ? ?ASA: 3 ? ?Anesthesia Plan: MAC  ? ?Post-op Pain Management: Minimal or no pain anticipated  ? ?Induction: Intravenous ? ?PONV Risk Score and Plan: 1 and TIVA and Treatment may vary due to age or medical condition ? ?Airway Management Planned: Natural Airway and Nasal Cannula ? ?Additional Equipment:  ? ?Intra-op Plan:  ? ?Post-operative Plan:  ? ?Informed Consent: I have reviewed the patients History and Physical, chart, labs and discussed the procedure including the risks, benefits and alternatives for the proposed anesthesia with the patient or authorized representative who has indicated his/her understanding and acceptance.  ? ? ? ?Dental advisory given ? ?Plan Discussed with: CRNA ? ?Anesthesia Plan Comments:   ? ? ? ? ? ?Anesthesia Quick Evaluation ? ?

## 2021-12-22 NOTE — Plan of Care (Signed)

## 2021-12-22 NOTE — Interval H&P Note (Signed)
History and Physical Interval Note: ? ?12/22/2021 ?11:18 AM ? ?Jeffrey [name]  has presented today for surgery, with the diagnosis of IDA.  The various methods of treatment have been discussed with the patient and family. After consideration of risks, benefits and other options for treatment, the patient has consented to  Procedure(s): ?ESOPHAGOGASTRODUODENOSCOPY (EGD) WITH PROPOFOL (N/A) as a surgical intervention.  The patient's history has been reviewed, patient examined, no change in status, stable for surgery.  I have reviewed the patient's chart and labs.  Questions were answered to the patient's satisfaction.   ? ? ?Debbe Crumble ? ? ?

## 2021-12-22 NOTE — Transfer of Care (Signed)
Immediate Anesthesia Transfer of Care Note ? ?Patient: Jeffrey [name] ? ?Procedure(s) Performed: ESOPHAGOGASTRODUODENOSCOPY (EGD) WITH PROPOFOL ? ?Patient Location: Endoscopy Unit ? ?Anesthesia Type:MAC ? ?Level of Consciousness: awake and patient cooperative ? ?Airway & Oxygen Therapy: Patient Spontanous Breathing ? ?Post-op Assessment: Report given to RN and Post -op Vital signs reviewed and stable ? ?Post vital signs: Reviewed and stable ? ?Last Vitals:  ?Vitals Value Taken Time  ?BP 166/113 12/22/21 1145  ?Temp    ?Pulse 91 12/22/21 1146  ?Resp 21 12/22/21 1146  ?SpO2 97 % 12/22/21 1146  ?Vitals shown include unvalidated device data. ? ?Last Pain:  ?Vitals:  ? 12/22/21 1145  ?TempSrc:   ?PainSc: Asleep  ?   ? ?  ? ?Complications: No notable events documented. ?

## 2021-12-23 ENCOUNTER — Inpatient Hospital Stay (HOSPITAL_COMMUNITY): Payer: Commercial Managed Care - HMO | Admitting: Anesthesiology

## 2021-12-23 ENCOUNTER — Encounter (HOSPITAL_COMMUNITY): Payer: Self-pay | Admitting: Family Medicine

## 2021-12-23 ENCOUNTER — Encounter (HOSPITAL_COMMUNITY)
Admission: EM | Disposition: A | Discharge status: Home / Self Care | Payer: Self-pay | Source: Home / Self Care | Attending: Family Medicine

## 2021-12-23 DIAGNOSIS — K5730 Diverticulosis of large intestine without perforation or abscess without bleeding: Secondary | ICD-10-CM

## 2021-12-23 DIAGNOSIS — D649 Anemia, unspecified: Secondary | ICD-10-CM | POA: Diagnosis not present

## 2021-12-23 DIAGNOSIS — N179 Acute kidney failure, unspecified: Secondary | ICD-10-CM | POA: Diagnosis not present

## 2021-12-23 DIAGNOSIS — F418 Other specified anxiety disorders: Secondary | ICD-10-CM

## 2021-12-23 DIAGNOSIS — I10 Essential (primary) hypertension: Secondary | ICD-10-CM

## 2021-12-23 DIAGNOSIS — K5790 Diverticulosis of intestine, part unspecified, without perforation or abscess without bleeding: Secondary | ICD-10-CM

## 2021-12-23 DIAGNOSIS — E8809 Other disorders of plasma-protein metabolism, not elsewhere classified: Secondary | ICD-10-CM | POA: Diagnosis not present

## 2021-12-23 DIAGNOSIS — D509 Iron deficiency anemia, unspecified: Secondary | ICD-10-CM | POA: Diagnosis not present

## 2021-12-23 DIAGNOSIS — R55 Syncope and collapse: Secondary | ICD-10-CM | POA: Diagnosis not present

## 2021-12-23 HISTORY — PX: COLONOSCOPY WITH PROPOFOL: SHX5780

## 2021-12-23 LAB — CBC
HCT: 25.4 % — ABNORMAL LOW (ref 39.0–52.0)
Hemoglobin: 8.1 g/dL — ABNORMAL LOW (ref 13.0–17.0)
MCH: 23.5 pg — ABNORMAL LOW (ref 26.0–34.0)
MCHC: 31.9 g/dL (ref 30.0–36.0)
MCV: 73.6 fL — ABNORMAL LOW (ref 80.0–100.0)
Platelets: 342 10*3/uL (ref 150–400)
RBC: 3.45 MIL/uL — ABNORMAL LOW (ref 4.22–5.81)
RDW: 27.7 % — ABNORMAL HIGH (ref 11.5–15.5)
WBC: 6.2 10*3/uL (ref 4.0–10.5)
nRBC: 0 % (ref 0.0–0.2)

## 2021-12-23 LAB — BASIC METABOLIC PANEL
Anion gap: 13 (ref 5–15)
BUN: 13 mg/dL (ref 6–20)
CO2: 24 mmol/L (ref 22–32)
Calcium: 7.1 mg/dL — ABNORMAL LOW (ref 8.9–10.3)
Chloride: 99 mmol/L (ref 98–111)
Creatinine, Ser: 1.26 mg/dL — ABNORMAL HIGH (ref 0.61–1.24)
GFR, Estimated: >60 mL/min (ref >60)
Glucose, Bld: 268 mg/dL — ABNORMAL HIGH (ref 70–99)
Potassium: 4.9 mmol/L (ref 3.5–5.1)
Sodium: 136 mmol/L (ref 135–145)

## 2021-12-23 LAB — GLUCOSE, CAPILLARY
Glucose-Capillary: 250 mg/dL — ABNORMAL HIGH (ref 70–99)
Glucose-Capillary: 230 mg/dL — ABNORMAL HIGH (ref 70–99)
Glucose-Capillary: 363 mg/dL — ABNORMAL HIGH (ref 70–99)
Glucose-Capillary: 349 mg/dL — ABNORMAL HIGH (ref 70–99)

## 2021-12-23 LAB — CALCIUM, IONIZED: Calcium, Ionized, Serum: 4.7 mg/dL (ref 4.5–5.6)

## 2021-12-23 SURGERY — COLONOSCOPY WITH PROPOFOL
Anesthesia: Monitor Anesthesia Care

## 2021-12-23 MED ORDER — OXYCODONE HCL 5 MG/5ML PO SOLN
5.0000 mg | Freq: Once | ORAL | Status: DC | PRN
  Filled 2021-12-23: qty 5

## 2021-12-23 MED ORDER — OXYCODONE HCL 5 MG PO TABS
5.0000 mg | ORAL_TABLET | Freq: Once | ORAL | Status: DC | PRN
  Filled 2021-12-23: qty 1

## 2021-12-23 MED ORDER — ONDANSETRON HCL 4 MG/2ML IJ SOLN
INTRAMUSCULAR | Status: DC | PRN
  Administered 2021-12-23: 4 mg via INTRAVENOUS

## 2021-12-23 MED ORDER — LACTATED RINGERS IV SOLN: INTRAVENOUS | Status: DC | PRN

## 2021-12-23 MED ORDER — LIDOCAINE 2% (20 MG/ML) 5 ML SYRINGE
INTRAMUSCULAR | Status: DC | PRN
  Administered 2021-12-23: 30 mg via INTRAVENOUS

## 2021-12-23 MED ORDER — PROPOFOL 500 MG/50ML IV EMUL
INTRAVENOUS | Status: DC | PRN
Start: 2021-12-23 — End: 2021-12-23
  Administered 2021-12-23: 100 ug/kg/min via INTRAVENOUS

## 2021-12-23 MED ORDER — PHENYLEPHRINE 40 MCG/ML (10ML) SYRINGE FOR IV PUSH (FOR BLOOD PRESSURE SUPPORT)
PREFILLED_SYRINGE | INTRAVENOUS | Status: DC | PRN
Start: 2021-12-23 — End: 2021-12-23
  Administered 2021-12-23: 80 ug via INTRAVENOUS

## 2021-12-23 MED ORDER — FENTANYL CITRATE (PF) 100 MCG/2ML IJ SOLN: 25.0000 ug | INTRAMUSCULAR | Status: DC | PRN

## 2021-12-23 MED ORDER — SODIUM CHLORIDE 0.9 % IV SOLN: INTRAVENOUS | Status: DC

## 2021-12-23 SURGICAL SUPPLY — 22 items

## 2021-12-23 NOTE — Anesthesia Preprocedure Evaluation (Signed)
Anesthesia Evaluation  ?Patient identified by MRN, date of birth, ID band ?Patient awake ? ? ? ?Reviewed: ?Allergy & Precautions, H&P , NPO status , Patient's Chart, lab work & pertinent test results ? ?Airway ?Mallampati: III ? ?TM Distance: >3 FB ?Neck ROM: Full ? ? ? Dental ? ?(+) Dental Advisory Given, Edentulous Upper ?  ?Pulmonary ?sleep apnea ,  ?  ?Pulmonary exam normal ?breath sounds clear to auscultation ? ? ? ? ? ? Cardiovascular ?hypertension, Pt. on medications ?Normal cardiovascular exam ?Rhythm:Regular Rate:Normal ? ? ?  ?Neuro/Psych ?Seizures -,  PSYCHIATRIC DISORDERS Anxiety Depression   ? GI/Hepatic ?PUD, GERD  Medicated,  ?Endo/Other  ?diabetes, Type 2, Insulin Dependent, Oral Hypoglycemic AgentsObesity ? ? Renal/GU ?Renal InsufficiencyRenal disease  ? ?  ?Musculoskeletal ? ? Abdominal ?  ?Peds ? Hematology ? ?(+) Blood dyscrasia, anemia ,   ?Anesthesia Other Findings ? ? Reproductive/Obstetrics ? ?  ? ? ? ? ? ? ? ? ? ? ? ? ? ?  ?  ? ? ? ? ? ? ? ? ?Anesthesia Physical ? ?Anesthesia Plan ? ?ASA: 3 ? ?Anesthesia Plan: MAC  ? ?Post-op Pain Management: Minimal or no pain anticipated  ? ?Induction: Intravenous ? ?PONV Risk Score and Plan: 1 and TIVA, Treatment may vary due to age or medical condition and Propofol infusion ? ?Airway Management Planned: Natural Airway and Nasal Cannula ? ?Additional Equipment:  ? ?Intra-op Plan:  ? ?Post-operative Plan:  ? ?Informed Consent: I have reviewed the patients History and Physical, chart, labs and discussed the procedure including the risks, benefits and alternatives for the proposed anesthesia with the patient or authorized representative who has indicated his/her understanding and acceptance.  ? ? ? ?Dental advisory given ? ?Plan Discussed with: CRNA, Anesthesiologist and Surgeon ? ?Anesthesia Plan Comments:   ? ? ? ? ? ? ?Anesthesia Quick Evaluation ? ?

## 2021-12-23 NOTE — Op Note (Signed)
[facility] ?Patient Name: Jeffrey [name] ?Procedure Date : 12/23/2021 ?MRN: [id number] ?Attending MD: Docia Chuck. Henrene Pastor , MD ?Date of Birth: [birth year] ?CSN: [id number] ?Age: 56 ?Admit Type: Inpatient ?Procedure:                Colonoscopy ?Indications:              Iron deficiency anemia ?Providers:                Docia Chuck. Henrene Pastor, MD, Grace Isaac, RN, Janie Billups,  ?                          Technician ?Referring MD:             Family Medicine ?Medicines:                Monitored Anesthesia Care ?Complications:            No immediate complications. Estimated blood loss:  ?                          None. ?Estimated Blood Loss:     Estimated blood loss: none. ?Procedure:                Pre-Anesthesia Assessment: ?                          - Prior to the procedure, a History and Physical  ?                          was performed, and patient medications and  ?                          allergies were reviewed. The patient's tolerance of  ?                          previous anesthesia was also reviewed. The risks  ?                          and benefits of the procedure and the sedation  ?                          options and risks were discussed with the patient.  ?                          All questions were answered, and informed consent  ?                          was obtained. Prior Anticoagulants: The patient has  ?                          taken no previous anticoagulant or antiplatelet  ?                          agents. ASA Grade Assessment: III - A patient with  ?                          severe  systemic disease. After reviewing the risks  ?                          and benefits, the patient was deemed in  ?                          satisfactory condition to undergo the procedure. ?                          After obtaining informed consent, the colonoscope  ?                          was passed under direct vision. Throughout the  ?                          procedure, the patient's blood pressure,  pulse, and  ?                          oxygen saturations were monitored continuously. The  ?                          [id number] ([id number]) Olympus colonoscope was  ?                          introduced through the anus and advanced to the the  ?                          cecum, identified by appendiceal orifice and  ?                          ileocecal valve. The ileocecal valve, appendiceal  ?                          orifice, and rectum were photographed. The quality  ?                          of the bowel preparation was excellent. The  ?                          colonoscopy was performed without difficulty. The  ?                          patient tolerated the procedure well. The bowel  ?                          preparation used was MoviPrep via split dose  ?                          instruction. ?Scope In: 9:19:33 AM ?Scope Out: 9:32:06 AM ?Scope Withdrawal Time: 0 hours 7 minutes 35 seconds  ?Total Procedure Duration: 0 hours 12 minutes 33 seconds  ?Findings: ?     A few scattered diverticula were found in the colon. ?     The exam was otherwise without abnormality on direct and retroflexion  ?  views. ?Impression:               - Diverticulosis. ?                          - The examination was otherwise normal on direct  ?                          and retroflexion views. ?                          - No specimens collected. ?Recommendation:           - Repeat colonoscopy in 10 years for screening  ?                          purposes. ?                          - Patient has a contact number available for  ?                          emergencies. The signs and symptoms of potential  ?                          delayed complications were discussed with the  ?                          patient. Return to normal activities tomorrow.  ?                          Written discharge instructions were provided to the  ?                          patient. ?                          - Resume previous diet. ?                           - Continue present medications. ?                          - Resume diet ?                          Discussed with Patient who was provided a copy of  ?                          this report ?                          GI will sign off. ?Procedure Code(s):        --- Professional --- ?                          [phone], Colonoscopy, flexible; diagnostic, including  ?  collection of specimen(s) by brushing or washing,  ?                          when performed (separate procedure) ?Diagnosis Code(s):        --- Professional --- ?                          D50.9, Iron deficiency anemia, unspecified ?                          K57.30, Diverticulosis of large intestine without  ?                          perforation or abscess without bleeding ?CPT copyright 2019 American Medical Association. All rights reserved. ?The codes documented in this report are preliminary and upon coder review may  ?be revised to meet current compliance requirements. ?Docia Chuck. Henrene Pastor, MD ?12/23/2021 9:40:40 AM ?This report has been signed electronically. ?Number of Addenda: 0 ?

## 2021-12-23 NOTE — Progress Notes (Signed)
Family Medicine Teaching Service ?Daily Progress Note ?Intern Pager: [phone] ? ?Patient name: Jeffrey [name] Medical record number: [id number] ?Date of birth: 12/27/1965 Age: 56 y.o. Gender: male ? ?Primary Care Provider: [name], MD ?Consultants: GI ?Code Status: Full ? ?Pt Overview and Major Events to Date:  ?12/20/21: Admitted  ? ?Assessment and Plan: ? ?Jeffrey [name] is a 56 year-old male who was admitted for generalized weakness, falls in the setting of recently starting losartan, hypoglycemia, anemia and chronic poor appetite.  PMH significant for hypertension, hyperlipidemia, CKD, MI, cardiac arrest, anoxic brain injury, D3UK, GERD, alcoholic liver cirrhosis, depression, anemia of chronic disease. ?  ?Generalized weakness and falls orthostatic hypotension ?BP 122/107 to 141/97 overnight, denies orthostatic symptoms  ?-Monitor vital signs ?-Ensure 3 times daily ?-PT/OT ?-Fall precautions ?-Holding Losartan  ? ?T2DM ?CBG 252 overnight  ?-CBG 4 times daily, before meals and at bedtime ?-Very sensitive sliding scale insulin ? ?Iron deficiency anemia with history of anemia of chronic disease ?Alcoholic liver cirrhosis ?History of erosive esophagitis ?Hb 7.3>1 URBC>Hb 8.7>Hb 8.1 ?EGD on 3/18: no abnormalities in stomach or duodenum  ?-Monitor with CBC daily ?-Transfusion threshold <8.0 given cardiac history ?-GI following, appreciate their care ?-Colonoscopy today ?  ?AKI on CKD stage IIIa, improving ?Cr 1.26 today, 1.2 yesterday. Baseline 0.7-1 ?-Monitor with BMP ?-Strict I/os ?-Avoid nephrotoxic medications ?-Holding Losartan ?  ?FEN/GI: NPO ?PPx: SCDs ?Dispo:Home with home health  pending clinical improvement . Barriers include on going inpatient work up.  ? ?Subjective:  ?No acute concerns overnight. Was able drink bowel prep overnight and has had multiple stools.  ? ?Objective: ?Temp:  [95.8 ?F (35.4 ?C)-97.6 ?F (36.4 ?C)] 97.5 ?F (36.4 ?C) (03/19 0254) ?Pulse Rate:  [81-104] 101 (03/19 0641) ?Resp:   [13-18] 18 (03/19 0641) ?BP: (122-174)/(96-118) 141/97 (03/19 0641) ?SpO2:  [94 %-100 %] 99 % (03/19 0641) ? ?Physical Exam: ?General: Alert, no acute distress ?Cardio: Normal S1 and S2, RRR, no r/m/g ?Pulm: CTAB, normal work of breathing ?Abdomen: Bowel sounds normal. Abdomen soft and non-tender.  ?Extremities: No peripheral edema.  ?Neuro: Cranial nerves grossly intact  ? ?Laboratory: ?Recent Labs  ?Lab 12/21/21 ?0419 12/22/21 ?0732 12/22/21 ?2124 12/23/21 ?0004  ?WBC 7.0 6.9  --  6.2  ?HGB 7.8* 7.3* 8.7* 8.1*  ?HCT 22.9* 23.1* 27.3* 25.4*  ?PLT 325 300  --  342  ? ?Recent Labs  ?Lab 12/20/21 ?1547 12/20/21 ?1600 12/21/21 ?0419 12/22/21 ?0732 12/23/21 ?0004  ?NA 137   < > 142 136 136  ?K 4.8   < > 4.4 4.8 4.9  ?CL 97*  --  99 100 99  ?CO2 28  --  32 27 24  ?BUN 18  --  '18 14 13  '$ ?CREATININE 1.83*  --  1.63* 1.20 1.26*  ?CALCIUM 7.1*  --  7.7* 7.1* 7.1*  ?PROT 5.7*  --   --   --   --   ?BILITOT 0.5  --   --   --   --   ?ALKPHOS 237*  --   --   --   --   ?ALT 42  --   --   --   --   ?AST 44*  --   --   --   --   ?GLUCOSE 335*  --  28* 144* 268*  ? < > = values in this interval not displayed.  ? ? ?No results found.  ? ?Lattie Haw, MD ?12/23/2021, 7:19 AM ?PGY-3, [place] ?FPTS  Intern pager: [phone], text pages welcome  ?

## 2021-12-23 NOTE — Anesthesia Procedure Notes (Signed)
Procedure Name: [place] ?Date/Time: 12/23/2021 9:05 AM ?Performed by: Eligha Bridegroom, CRNA ?Pre-anesthesia Checklist: Patient identified ?Patient Re-evaluated:Patient Re-evaluated prior to induction ?Oxygen Delivery Method: Nasal cannula ?Preoxygenation: Pre-oxygenation with 100% oxygen ?Induction Type: IV induction ? ? ? ? ?

## 2021-12-23 NOTE — Progress Notes (Signed)
FPTS Brief Progress Note ? ?S stopped to see patient but he was sleeping.  Did not disturb ? ?O: ?BP (!) 142/96   Pulse 99   Temp 98 ?F (36.7 ?C) (Oral)   Resp 18   Ht '5\' 10"'$  (1.778 m)   Wt 102.1 kg   SpO2 100%   BMI 32.28 kg/m?   ?General: Resting comfortably ? ?A/P: ?Plan per day team  ?Patient had colonoscopy performed today which was unremarkable.  He also had repeat orthostatic vital signs collected which were also negative.  Plan for discharge tomorrow. ?- Orders reviewed. Labs for AM ordered, which was adjusted as needed.  ?- If condition changes, plan includes transfusion RBC.  ? ?[name], MD ?12/23/2021, 10:36 PM ?PGY-3, [place] Night Resident  ?Please page [phone] with questions.   ?

## 2021-12-23 NOTE — Transfer of Care (Signed)
Immediate Anesthesia Transfer of Care Note ? ?Patient: Jeffrey [name] ? ?Procedure(s) Performed: COLONOSCOPY WITH PROPOFOL ? ?Patient Location: Endoscopy Unit ? ?Anesthesia Type:MAC ? ?Level of Consciousness: awake and alert  ? ?Airway & Oxygen Therapy: Patient Spontanous Breathing and Patient connected to nasal cannula oxygen ? ?Post-op Assessment: Report given to RN and Post -op Vital signs reviewed and stable ? ?Post vital signs: Reviewed and stable ? ?Last Vitals:  ?Vitals Value Taken Time  ?BP 98/61 12/23/21 0941  ?Temp    ?Pulse 76 12/23/21 0942  ?Resp 20 12/23/21 0942  ?SpO2 100 % 12/23/21 0942  ?Vitals shown include unvalidated device data. ? ?Last Pain:  ?Vitals:  ? 12/23/21 0941  ?TempSrc:   ?PainSc: 0-No pain  ?   ? ?  ? ?Complications: No notable events documented. ?

## 2021-12-23 NOTE — Interval H&P Note (Signed)
History and Physical Interval Note: ? ?12/23/2021 ?9:06 AM ? ?Jeffrey [name]  has presented today for surgery, with the diagnosis of anemia.  The various methods of treatment have been discussed with the patient and family. After consideration of risks, benefits and other options for treatment, the patient has consented to  Procedure(s): ?COLONOSCOPY WITH PROPOFOL (N/A) as a surgical intervention.  The patient's history has been reviewed, patient examined, no change in status, stable for surgery.  I have reviewed the patient's chart and labs.  Questions were answered to the patient's satisfaction.   ? ? ?Scarlette Shorts ? ? ?

## 2021-12-24 ENCOUNTER — Encounter (HOSPITAL_COMMUNITY): Payer: Self-pay | Admitting: Gastroenterology

## 2021-12-24 DIAGNOSIS — R739 Hyperglycemia, unspecified: Secondary | ICD-10-CM | POA: Diagnosis not present

## 2021-12-24 DIAGNOSIS — R531 Weakness: Secondary | ICD-10-CM

## 2021-12-24 DIAGNOSIS — N179 Acute kidney failure, unspecified: Secondary | ICD-10-CM | POA: Diagnosis not present

## 2021-12-24 DIAGNOSIS — D649 Anemia, unspecified: Secondary | ICD-10-CM | POA: Diagnosis not present

## 2021-12-24 LAB — BASIC METABOLIC PANEL
Anion gap: 10 (ref 5–15)
Anion gap: 8 (ref 5–15)
BUN: 16 mg/dL (ref 6–20)
BUN: 15 mg/dL (ref 6–20)
CO2: 23 mmol/L (ref 22–32)
CO2: 28 mmol/L (ref 22–32)
Calcium: 7 mg/dL — ABNORMAL LOW (ref 8.9–10.3)
Calcium: 7 mg/dL — ABNORMAL LOW (ref 8.9–10.3)
Chloride: 103 mmol/L (ref 98–111)
Chloride: 101 mmol/L (ref 98–111)
Creatinine, Ser: 1.64 mg/dL — ABNORMAL HIGH (ref 0.61–1.24)
Creatinine, Ser: 1.54 mg/dL — ABNORMAL HIGH (ref 0.61–1.24)
GFR, Estimated: 48 mL/min — ABNORMAL LOW (ref >60)
GFR, Estimated: 52 mL/min — ABNORMAL LOW (ref >60)
Glucose, Bld: 256 mg/dL — ABNORMAL HIGH (ref 70–99)
Glucose, Bld: 230 mg/dL — ABNORMAL HIGH (ref 70–99)
Potassium: 5.5 mmol/L — ABNORMAL HIGH (ref 3.5–5.1)
Potassium: 4.6 mmol/L (ref 3.5–5.1)
Sodium: 136 mmol/L (ref 135–145)
Sodium: 137 mmol/L (ref 135–145)

## 2021-12-24 LAB — HEMOGLOBIN AND HEMATOCRIT, BLOOD
HCT: 29.2 % — ABNORMAL LOW (ref 39.0–52.0)
Hemoglobin: 9.6 g/dL — ABNORMAL LOW (ref 13.0–17.0)

## 2021-12-24 LAB — CBC
HCT: 21.8 % — ABNORMAL LOW (ref 39.0–52.0)
HCT: 22.3 % — ABNORMAL LOW (ref 39.0–52.0)
Hemoglobin: 7.2 g/dL — ABNORMAL LOW (ref 13.0–17.0)
Hemoglobin: 7.3 g/dL — ABNORMAL LOW (ref 13.0–17.0)
MCH: 23.6 pg — ABNORMAL LOW (ref 26.0–34.0)
MCH: 23.9 pg — ABNORMAL LOW (ref 26.0–34.0)
MCHC: 33 g/dL (ref 30.0–36.0)
MCHC: 32.7 g/dL (ref 30.0–36.0)
MCV: 71.5 fL — ABNORMAL LOW (ref 80.0–100.0)
MCV: 72.9 fL — ABNORMAL LOW (ref 80.0–100.0)
Platelets: 258 10*3/uL (ref 150–400)
Platelets: 257 10*3/uL (ref 150–400)
RBC: 3.05 MIL/uL — ABNORMAL LOW (ref 4.22–5.81)
RBC: 3.06 MIL/uL — ABNORMAL LOW (ref 4.22–5.81)
RDW: 26.9 % — ABNORMAL HIGH (ref 11.5–15.5)
RDW: 27.9 % — ABNORMAL HIGH (ref 11.5–15.5)
WBC: 8 10*3/uL (ref 4.0–10.5)
WBC: 6.1 10*3/uL (ref 4.0–10.5)
nRBC: 0.3 % — ABNORMAL HIGH (ref 0.0–0.2)
nRBC: 0 % (ref 0.0–0.2)

## 2021-12-24 LAB — PREPARE RBC (CROSSMATCH)

## 2021-12-24 LAB — GLUCOSE, CAPILLARY
Glucose-Capillary: 229 mg/dL — ABNORMAL HIGH (ref 70–99)
Glucose-Capillary: 255 mg/dL — ABNORMAL HIGH (ref 70–99)
Glucose-Capillary: 270 mg/dL — ABNORMAL HIGH (ref 70–99)
Glucose-Capillary: 305 mg/dL — ABNORMAL HIGH (ref 70–99)

## 2021-12-24 MED ORDER — AMLODIPINE BESYLATE 5 MG PO TABS
2.5000 mg | ORAL_TABLET | Freq: Every day | ORAL | Status: DC
  Administered 2021-12-24 – 2021-12-25 (×2): 2.5 mg via ORAL
  Filled 2021-12-24 (×2): qty 1

## 2021-12-24 MED ORDER — HYDROCERIN EX CREA
TOPICAL_CREAM | Freq: Every day | CUTANEOUS | Status: DC
  Filled 2021-12-24: qty 113

## 2021-12-24 MED ORDER — SODIUM CHLORIDE 0.9% IV SOLUTION: Freq: Once | INTRAVENOUS | Status: AC

## 2021-12-24 NOTE — Progress Notes (Signed)
Inpatient Diabetes Program Recommendations ? ?AACE/ADA: New Consensus Statement on Inpatient Glycemic Control  ? ?Target Ranges:  Prepandial:   less than 140 mg/dL ?     Peak postprandial:   less than 180 mg/dL (1-2 hours) ?     Critically ill patients:  140 - 180 mg/dL  ? ? Latest Reference Range & Units 12/24/21 07:21  ?Glucose-Capillary 70 - 99 mg/dL 229 (H)  ? ? Latest Reference Range & Units 12/23/21 07:24 12/23/21 11:20 12/23/21 17:12 12/23/21 20:44  ?Glucose-Capillary 70 - 99 mg/dL 250 (H) 230 (H) 363 (H) 349 (H)  ? ? ?Review of Glycemic Control ? ?Diabetes history: DM2 ?Outpatient Diabetes medications: Lantus 5 units QPM, Metformin 859 mg BID, Trulicity 0.93 mg Qweek (Wednesday) ?Current orders for Inpatient glycemic control: Novolog 0-6 units TID with meals ? ?Inpatient Diabetes Program Recommendations:   ? ?Insulin: Please consider ordering Lantus 5 units daily and adding Novolog 0-5 units QHS for bedtime correction. If post prandial glucose remains elevated, may need to add meal coverage insulin as well. ? ?Diet: Please consider discontinuing Regular diet and ordering Carb Modified. ? ?Thanks, ?Barnie Alderman, RN, MSN, CDE ?Diabetes Coordinator ?Inpatient Diabetes Program ?[phone] (Team Pager from 8am to 5pm) ? ? ? ?

## 2021-12-24 NOTE — Progress Notes (Signed)
Family Medicine Teaching Service ?Daily Progress Note ?Intern Pager: [phone] ? ?Patient name: Jeffrey [name] Medical record number: [id number] ?Date of birth: 09-04-1966 Age: 56 y.o. Gender: male ? ?Primary Care Provider: [name], MD ?Consultants: Gastroenterology ?Code Status: Full ? ?Pt Overview and Major Events to Date:  ?12/20/21: Admitted ? ?Assessment and Plan: ?Jeffrey [name] is a 56 year-old male who was admitted for generalized weakness, falls in the setting of recently starting Losartan, hypoglycemia, anemia and poor appetite. PMH significant for hypertension, hyperlipidemia, CKD, MI s/p cardiac arrest, anoxic brain injury, T2DM, GERD, ETOH liver cirrhosis, depression, anemia of chronic disease. ? ?Generalized weakness and falls  Orthostatic hypotension ?BP elevated overnight to 140s-170s/90s-100s. Says he is not comfortable going home yet, wants to spend one more day in the hospital. Appetite is much improved- he ate entire breakfast tray. Desires liberalization of his diet, says he "can't eat anything here." Regarding BP, will start Amlodipine 2.'5mg'$  daily. ?- Start Amlodipine 2.'5mg'$  daily ?- Monitor VS ?- Ensure TID ?- Pt/OT ? ?T2DM ?CBGs in 200s, stable. Received 9u novolg in past 24 hours. ?- CBG 4 times daily, at meals and bedtime ?- Very sensitive sliding scale insulin ? ?Iron deficiency anemia w/ hx anemia of chronic disease ?ETOh liver cirrhosis ?Hx erosive esophagitis ?Hgb 7.3 this am. EGD and colonoscopy unremarkable other than diverticulosis. Will transfuse 1u this morning. ?- F/u posttransfusion H/H ?- GI following, appreciate care ?- PO iron ? ?AKI on CKD stage 3a ?Cr 1.56, GFR 52. baseline 0.7-1.0. Likely prerenal, had bowel prep for EGD/colonoscopy. ?- Monitor with BMP ?- Strict  I/O ?- Avoid nephrotoxic medications ? ?FEN/GI: Regular diet ?PPx: SCDs ?Dispo:Home with home health  pending clinical improvement , today vs. Tomorrow.  ?Subjective:  ?Says he is not ready to go home.  Wants to stay and work with PT more. Also wants a month supply of Libre 2 for diabetes to "go on the hospital bill." Wants his diet liberalized because he "can't order anything good" with current diet. ? ?Objective: ?Temp:  [96.8 ?F (36 ?C)-98.4 ?F (36.9 ?C)] 97.6 ?F (36.4 ?C) (03/20 9485) ?Pulse Rate:  [78-99] 88 (03/20 0855) ?Resp:  [14-19] 16 (03/20 0855) ?BP: (98-168)/(61-114) 159/94 (03/20 0855) ?SpO2:  [99 %-100 %] 99 % (03/20 0855) ?Physical Exam: ?General: Comfortably, in bed in NAD ?Cardiovascular: RRR ?Respiratory: Clear in all fields. Normal work of breathing on room air. ?Abdomen: Soft, non-tender, non-distended ?Extremities: Warm, well perfused ? ?Laboratory: ?Recent Labs  ?Lab 12/22/21 ?0732 12/22/21 ?2124 12/23/21 ?0004 12/24/21 ?0120  ?WBC 6.9  --  6.2 8.0  ?HGB 7.3* 8.7* 8.1* 7.2*  ?HCT 23.1* 27.3* 25.4* 21.8*  ?PLT 300  --  342 258  ? ?Recent Labs  ?Lab 12/20/21 ?1547 12/20/21 ?1600 12/23/21 ?0004 12/24/21 ?0120 12/24/21 ?0636  ?NA 137   < > 136 136 137  ?K 4.8   < > 4.9 5.5* 4.6  ?CL 97*   < > 99 103 101  ?CO2 28   < > '24 23 28  '$ ?BUN 18   < > '13 16 15  '$ ?CREATININE 1.83*   < > 1.26* 1.64* 1.54*  ?CALCIUM 7.1*   < > 7.1* 7.0* 7.0*  ?PROT 5.7*  --   --   --   --   ?BILITOT 0.5  --   --   --   --   ?ALKPHOS 237*  --   --   --   --   ?ALT 42  --   --   --   --   ?  AST 44*  --   --   --   --   ?GLUCOSE 335*   < > 268* 256* 230*  ? < > = values in this interval not displayed.  ? ? ?Imaging/Diagnostic Tests: ?No results found. ? ? ?Orvis Brill, DO ?12/24/2021, 9:21 AM ?PGY-1, [facility] ?[facility] Intern pager: [phone], text pages welcome ? ?

## 2021-12-24 NOTE — Progress Notes (Signed)
FPTS Brief Progress Note ? ?S: Patient sitting in chair next to bed.  Comfortable and no concerns at this time.  Does report that he had an episode of dizziness while working with OT today but has not had any episodes since that time. ? ? ?O: ?BP (!) 165/108 (BP Location: Left Arm)   Pulse 95   Temp 97.8 ?F (36.6 ?C) (Oral)   Resp 18   Ht '5\' 10"'$  (1.778 m)   Wt 102.1 kg   SpO2 100%   BMI 32.28 kg/m?   ?General: Pleasant 56 year old male ?Cardiac: Regular rate ?Respiratory: Normal work of breathing, speaking full sentences ? ?A/P: ?-Continue day team plan, echo pending at this time ?- Orders reviewed. Labs for AM ordered, which was adjusted as needed.  ? ?[name], MD ?12/24/2021, 9:15 PM ?PGY-3, [place] [facility] Night Resident  ?Please page [phone] with questions.  ? ? ?

## 2021-12-24 NOTE — Progress Notes (Signed)
Occupational Therapy Treatment ?Patient Details ?Name: Jeffrey [name] ?MRN: [id number] ?DOB: [birth year] ?Today's Date: 12/24/2021 ? ? ?History of present illness 56 y.o. male presenting with dizziness and falls for past few weeks. PMH is significant for HTN, HLD, CKD, MI, cardiac arrest, anoxic brain injury, T2DM, GERD, cirrhosis, Depression, anemia of chronic disease ?  ?OT comments ? Pt continues to be limited by syncopal symptoms. Pt initially agitated that therapy did not walk with him over the weekend and adamant on ambulating in hallway today. Assessed BP throughout session (see general comments for details) with initial BP drop in standing that improved with prolonged standing. However, after approximately 32f of hallway mobility, pt with sudden syncopal onset requiring physical assistance to safely sit on locked Rollator (BP normal). Extended time spent discussing safety concerns if pt unable to quickly identify symptom onset to initiate seated rest break on Rollator to prevent fall at home. Pt expresses that these issues have occurred with a certain BP medication in the past - MD/RN aware.   ? ?Recommendations for follow up therapy are one component of a multi-disciplinary discharge planning process, led by the attending physician.  Recommendations may be updated based on patient status, additional functional criteria and insurance authorization. ?   ?Follow Up Recommendations ? No OT follow up  ?  ?Assistance Recommended at Discharge Intermittent Supervision/Assistance  ?Patient can return home with the following ? Assistance with cooking/housework;Assist for transportation;Help with stairs or ramp for entrance ?  ?Equipment Recommendations ? None recommended by OT  ?  ?Recommendations for Other Services   ? ?  ?Precautions / Restrictions Precautions ?Precautions: Fall;Other (comment) ?Precaution Comments: monitor orthostatics ?Restrictions ?Weight Bearing Restrictions: No  ? ? ?  ? ?Mobility Bed  Mobility ?Overal bed mobility: Modified Independent ?  ?  ?  ?  ?  ?  ?  ?  ? ?Transfers ?Overall transfer level: Needs assistance ?Equipment used: Rollator (4 wheels) ?Transfers: Sit to/from Stand ?Sit to Stand: Min guard ?  ?  ?  ?  ?  ?General transfer comment: use of momentum and increased time to complete ?  ?  ?Balance Overall balance assessment: Needs assistance, History of Falls ?Sitting-balance support: No upper extremity supported, Feet supported ?Sitting balance-Leahy Scale: Good ?  ?  ?Standing balance support: Bilateral upper extremity supported, During functional activity ?Standing balance-Leahy Scale: Fair ?Standing balance comment: able to stand without support, BUE for mobility ?  ?  ?  ?  ?  ?  ?  ?  ?  ?  ?  ?   ? ?ADL either performed or assessed with clinical judgement  ? ?ADL Overall ADL's : Needs assistance/impaired ?  ?  ?  ?  ?  ?  ?  ?  ?Upper Body Dressing : Set up;Sitting ?Upper Body Dressing Details (indicate cue type and reason): to don gown around back ?  ?  ?  ?  ?  ?  ?  ?  ?Functional mobility during ADLs: Min guard;Rollator (4 wheels) ?General ADL Comments: Pt declined to engage in ADLs standing at sink to further assess BP/syncopal symptoms. Pt adamant to walk in hallway to see his syncopal symptoms were truly resolved ?  ? ?Extremity/Trunk Assessment Upper Extremity Assessment ?Upper Extremity Assessment: Overall WFL for tasks assessed ?  ?Lower Extremity Assessment ?Lower Extremity Assessment: Defer to PT evaluation ?  ?  ?  ? ?Vision   ?Vision Assessment?: No apparent visual deficits ?  ?Perception   ?  ?  Praxis   ?  ? ?Cognition Arousal/Alertness: Awake/alert ?Behavior During Therapy: [facility] for tasks assessed/performed ?Overall Cognitive Status: History of cognitive impairments - at baseline ?  ?  ?  ?  ?  ?  ?  ?  ?  ?  ?  ?  ?  ?  ?  ?  ?General Comments: hx of anoxic brain injury; pleasant and appropriate throughout session; shows insight into deficits and safety concerns ?  ?   ?   ?Exercises   ? ?  ?Shoulder Instructions   ? ? ?  ?General Comments 121/89 supine, 132/102 sitting EOB. Due to dynamap unable to store BP readings, pt requiring hands on assist and in contact precaution room, unable to pen the following specific BP readings: 102/70s with initial standing at bedside, increasing to 119/80s with prolonged standing in room. In hallway, pt with sudden onset of syncopal episode with glazed look and difficulty controlling rollator - requiring OT and RN assist to turn and sit on locked Rollator. NT obtained dynamap with BP 126/70s seated on Rollator - requiring > 3 min seated rest break for appropriate responses. Pt reports this is exactly what happened in the past with a previous BP medication and it only occurs when he is walking - MD/RN aware. Collab with pt on being aware of symptom onset in order to quickly sit on locked rollator to prevent fall at home; as pt did not verbally report onset of symptoms to this OT  ? ? ?Pertinent Vitals/ Pain       Pain Assessment ?Pain Assessment: No/denies pain ? ?Home Living   ?  ?  ?  ?  ?  ?  ?  ?  ?  ?  ?  ?  ?  ?  ?  ?  ?  ?  ? ?  ?Prior Functioning/Environment    ?  ?  ?  ?   ? ?Frequency ? Min 2X/week  ? ? ? ? ?  ?Progress Toward Goals ? ?OT Goals(current goals can now be found in the care plan section) ? Progress towards OT goals: OT to reassess next treatment ? ?Acute Rehab OT Goals ?Patient Stated Goal: resolve syncopal episodes ?OT Goal Formulation: With patient ?Time For Goal Achievement: 01/04/22 ?Potential to Achieve Goals: Good ?ADL Goals ?Pt Will Transfer to Toilet: with modified independence;ambulating ?Pt Will Perform Tub/Shower Transfer: Tub transfer;with set-up;ambulating ?Additional ADL Goal #1: Pt to increase standing activity tolerance > 7 min implementing appropriate syncopal episode precautions prn ?Additional ADL Goal #2: Pt to gather ADL/IADL items MOD I  ?Plan Discharge plan remains appropriate   ? ?Co-evaluation ? ? ?    ?  ?  ?  ?  ? ?  ?AM-PAC OT "6 Clicks" Daily Activity     ?Outcome Measure ? ? Help from another person eating meals?: None ?Help from another person taking care of personal grooming?: A Little ?Help from another person toileting, which includes using toliet, bedpan, or urinal?: A Little ?Help from another person bathing (including washing, rinsing, drying)?: A Little ?Help from another person to put on and taking off regular upper body clothing?: A Little ?Help from another person to put on and taking off regular lower body clothing?: A Little ?6 Click Score: 19 ? ?  ?End of Session Equipment Utilized During Treatment: Rollator (4 wheels);Gait belt ? ?OT Visit Diagnosis: Unsteadiness on feet (R26.81) ?  ?Activity Tolerance Treatment limited secondary to medical complications (Comment) ?  ?Patient Left  in chair;with call bell/phone within reach;with chair alarm set ?  ?Nurse Communication Mobility status;Other (comment) (BP) ?  ? ?   ? ?Time: 1101-1146 ?OT Time Calculation (min): 45 min ? ?Charges: OT General Charges ?$OT Visit: 1 Visit ?OT Treatments ?$Self Care/Home Management : 8-22 mins ?$Therapeutic Activity: 23-37 mins ? ?Malachy Chamber, OTR/L ?Acute Rehab Services ?Office: [phone]  ? ?Layla Maw ?12/24/2021, 12:17 PM ?

## 2021-12-24 NOTE — Progress Notes (Signed)
Physical Therapy Treatment ?Patient Details ?Name: Jeffrey [name] ?MRN: [id number] ?DOB: 1965-12-18 ?Today's Date: 12/24/2021 ? ? ?History of Present Illness 56 y.o. male presenting with dizziness and falls for past few weeks. PMH is significant for HTN, HLD, CKD, MI, cardiac arrest, anoxic brain injury, T2DM, GERD, cirrhosis, Depression, anemia of chronic disease ? ?  ?PT Comments  ? ? Pt is a 56 yo male presenting with dizziness and a recent falls history. Pt reported that medications are altering his functional status - OT has communicated with his MD. Pt was engaged and motivated to walk in the hallway to continue working on his activity tolerance. ? ?Prior to activity, PT and Pt discussed pre-syncopal symptoms to educate Pt more about what to look for, and how to respond accordingly; Pt was responsive and willing to learn. Pt stated they only feel "dizziness"; although his BP reading suggests orthostasis alongside his dizziness. Pt recevied education well and was very communicative during session when walking with PT. Orthostatic measurements were taken during the course of PT session (see values below). Pt walked a total of 6 minutes; with recliner follow, and was sat back down due to dizziness. Pt was rolled back to room while talking with MD. Pt was left in recliner with chair alarm set. Will benefit from PT services. ? ? 12/24/21 1658 12/24/21 1704  ?Orthostatic Sitting (pre- and post-standing activity)  ?BP- Sitting (!) 145/112 (!) 161/113  ?Pulse- Sitting 104 ?(MAP = 123) 92 ?(MAP = 128, sitting after standing activity.)  ?Orthostatic Standing at 0 minutes  ?BP- Standing at 0 minutes (!) 143/112  --   ?Pulse- Standing at 0 minutes 104 ?(MAP = 128)  --   ?Orthostatic Standing at 3 and 6 minutes  ?BP- Standing at 3 minutes (!) 128/97 (!) 130/93  ?Pulse- Standing at 3 minutes 102 ?(MAP = 106, reported dizziness) 98 ?(MAP = 106, reported dizziness and needed to sit down at 6 minutes of activity.)  ? ? ? ?   ?Recommendations for follow up therapy are one component of a multi-disciplinary discharge planning process, led by the attending physician.  Recommendations may be updated based on patient status, additional functional criteria and insurance authorization. ? ?Follow Up Recommendations ? Outpatient PT ?  ?  ?Assistance Recommended at Discharge Intermittent Supervision/Assistance  ?Patient can return home with the following A lot of help with walking and/or transfers ?  ?Equipment Recommendations ? Rolling walker (2 wheels);Rollator (4 wheels)  ?  ?Recommendations for Other Services   ? ? ?  ?Precautions / Restrictions Precautions ?Precautions: Fall;Other (comment) ?Precaution Comments: monitor orthostatics ?Restrictions ?Weight Bearing Restrictions: No  ?  ? ?Mobility ? Bed Mobility ?  ?  ?  ?  ?  ?  ?  ?  ?  ? ?Transfers ?Overall transfer level: Needs assistance ?Equipment used: Rolling walker (2 wheels) (With recliner behind) ?Transfers: Sit to/from Stand ?Sit to Stand: Min guard ?  ?  ?  ?  ?  ?General transfer comment: use of momentum and increased time to complete ?  ? ?Ambulation/Gait ?Ambulation/Gait assistance: Min guard ?Gait Distance (Feet): 50 Feet ?Assistive device: Rolling walker (2 wheels) (with recliner follow) ?Gait Pattern/deviations: Trunk flexed ?Gait velocity: Decreased ?  ?  ?General Gait Details: Trunk was flexed during walk in hallway; likely due to fatigue. Pt was given cues to monitor pre-syncopal symptoms before and during wallk. ? ? ?Stairs ?  ?  ?  ?  ?  ? ? ?Wheelchair Mobility ?  ? ?  Modified Rankin (Stroke Patients Only) ?  ? ? ?  ?Balance   ?Sitting-balance support: No upper extremity supported, Feet supported ?  ?  ?  ?Standing balance support: Bilateral upper extremity supported, During functional activity ?  ?  ?  ?  ?  ?  ?  ?  ?  ?  ?  ?  ?  ?  ? ?  ?Cognition Arousal/Alertness: Awake/alert ?Behavior During Therapy: [facility] for tasks assessed/performed ?Overall Cognitive Status:  History of cognitive impairments - at baseline ?  ?  ?  ?  ?  ?  ?  ?  ?  ?  ?  ?  ?  ?  ?  ?  ?General Comments: hx of anoxic brain injury; pleasant and appropriate throughout session; shows insight into deficits and safety concerns ?  ?  ? ?  ?Exercises   ? ?  ?General Comments   ?  ?  ? ?Pertinent Vitals/Pain Pain Assessment ?Pain Assessment: No/denies pain  ? ? ?Home Living   ?  ?  ?  ?  ?  ?  ?  ?  ?  ?   ?  ?Prior Function    ?  ?  ?   ? ?PT Goals (current goals can now be found in the care plan section) Acute Rehab PT Goals ?PT Goal Formulation: With patient ?Time For Goal Achievement: 01/04/22 ?Potential to Achieve Goals: Good ? ?  ?Frequency ? ? ? Min 3X/week ? ? ? ?  ?PT Plan Current plan remains appropriate  ? ? ?Co-evaluation   ?  ?  ?  ?  ? ?  ?AM-PAC PT "6 Clicks" Mobility   ?Outcome Measure ? Help needed turning from your back to your side while in a flat bed without using bedrails?: None ?Help needed moving from lying on your back to sitting on the side of a flat bed without using bedrails?: None ?Help needed moving to and from a bed to a chair (including a wheelchair)?: A Little ?Help needed standing up from a chair using your arms (e.g., wheelchair or bedside chair)?: A Little ?Help needed to walk in hospital room?: A Little ?Help needed climbing 3-5 steps with a railing? : Total ?6 Click Score: 18 ? ?  ?End of Session Equipment Utilized During Treatment: Gait belt ?Activity Tolerance: Patient limited by fatigue;Other (comment) (Pt was notciably fatigued and needed to sit down at the end of walk due to dizziness.) ?Patient left: in chair;with call bell/phone within reach;with chair alarm set ?Nurse Communication: Mobility status ?PT Visit Diagnosis: Unsteadiness on feet (R26.81);Muscle weakness (generalized) (M62.81);History of falling (Z91.81);Other abnormalities of gait and mobility (R26.89) ?  ? ? ?Time: 6811-5726 ?PT Time Calculation (min) (ACUTE ONLY): 36 min ? ?Charges:  $Gait Training: 23-37  mins          ?          ? ?Artis Flock, SPT ?Acute Rehab Services ?Office: [phone]  ? ? ?Artis Flock ?12/24/2021, 5:17 PM ? ?

## 2021-12-24 NOTE — TOC Progression Note (Signed)
Transition of Care (TOC) - Progression Note  ? ? ?Patient Details  ?Name: Jeffrey [name] ?MRN: [id number] ?Date of Birth: 09-06-1966 ? ?Transition of Care (TOC) CM/SW Contact  ?Tom-Johnson, Renea Ee, RN ?Phone Number: ?12/24/2021, 4:22 PM ? ?Clinical Narrative:    ? ?PT recommended outpatient OT.CM gave patient list of outpatient facilities from [email or web address] and he chose [facility] on [address]. Order and info on AVS. CM will continue to follow with needs.  ? ?Expected Discharge Plan: Home/Self Care ?Barriers to Discharge: Continued Medical Work up ? ?Expected Discharge Plan and Services ?Expected Discharge Plan: Home/Self Care ?  ?Discharge Planning Services: CM Consult ?Post Acute Care Choice: NA ?Living arrangements for the past 2 months: [place] ?                ?DME Arranged: N/A ?DME Agency: NA ?  ?  ?  ?  ?[place] Agency: NA ?  ?  ?  ? ? ?Social Determinants of Health (SDOH) Interventions ?  ? ?Readmission Risk Interventions ?Readmission Risk Prevention Plan 12/21/2021 09/17/2021 08/01/2021  ?Transportation Screening Complete Complete Complete  ?PCP or Specialist Appt within 5-7 Days - - Complete  ?PCP or Specialist Appt within 3-5 Days Complete Complete -  ?Home Care Screening - - Patient refused  ?Medication Review (RN CM) - - Complete  ?[place] or Home Care Consult Complete Complete -  ?Social Work Consult for [place] Planning/Counseling Complete Complete -  ?Palliative Care Screening Not Applicable Not Applicable -  ?Medication Review Press photographer) Complete Complete -  ?Some recent data might be hidden  ? ? ?

## 2021-12-24 NOTE — Consult Note (Signed)
WOC Nurse Consult Note: ?Reason for Consult: full thickness skin lesions on dorsal aspects of 4 digits: bilateral great does and 2nd digits. Largest measures 0.4cm round x 0.2cm. ?Wound type: suspected trauma during fall, patient reports also that "shoes may be a little tight" ?Pressure Injury POA: N/A ?Measurement: 4 circular lesions on the above-mentioned digits, the largest is on the left great toe and it measures 0.4cm round x 0.2cm deep ?Wound bed: red, dry ?Drainage (amount, consistency, odor) none (dried serum on wound periphery) ?Periwound: intact, very dry skin on bilateral LEs ?Dressing procedure/placement/frequency: ?I have provided Nursing with guidance for the care of the LEs using a daily soap and water cleanse followed by the application of an emollient, Eucerin cream, to the legs and feet (do not apply between the toes). This will be follow by the application of a povidone iodine swabstick to the lesions and allowing it to dry (repeated in the pm).  Heels are to be floated. A sacral foam is to be applied for PI prophylaxis. A pressure redistribution chair cushion is provided for OOB use. ? ?[place] nursing team will not follow, but will remain available to this patient, the nursing and medical teams.  Please re-consult if needed. ?Thanks, ?Maudie Flakes, MSN, RN, [place], [place], CWON-AP, [place]  ?Pager# [phone]  ? ? ?  ?

## 2021-12-25 ENCOUNTER — Inpatient Hospital Stay (HOSPITAL_COMMUNITY): Payer: Commercial Managed Care - HMO

## 2021-12-25 DIAGNOSIS — R55 Syncope and collapse: Secondary | ICD-10-CM | POA: Diagnosis not present

## 2021-12-25 DIAGNOSIS — E119 Type 2 diabetes mellitus without complications: Secondary | ICD-10-CM | POA: Diagnosis not present

## 2021-12-25 DIAGNOSIS — N179 Acute kidney failure, unspecified: Secondary | ICD-10-CM | POA: Diagnosis not present

## 2021-12-25 DIAGNOSIS — R531 Weakness: Secondary | ICD-10-CM | POA: Diagnosis not present

## 2021-12-25 LAB — TYPE AND SCREEN
ABO/RH(D): O POS
Antibody Screen: NEGATIVE
Unit division: 0
Unit division: 0

## 2021-12-25 LAB — ECHOCARDIOGRAM COMPLETE
AR max vel: 3.83 cm2
AV Area VTI: 3.73 cm2
AV Area mean vel: 3.72 cm2
AV Mean grad: 3 mmHg
AV Peak grad: 5.2 mmHg
Ao pk vel: 1.14 m/s
Area-P 1/2: 5.02 cm2
Calc EF: 57 %
Height: 70 in
MV VTI: 3.4 cm2
S' Lateral: 2.8 cm
Single Plane A2C EF: 59.6 %
Single Plane A4C EF: 56.1 %
Weight: 3600 oz

## 2021-12-25 LAB — BASIC METABOLIC PANEL
Anion gap: 7 (ref 5–15)
BUN: 16 mg/dL (ref 6–20)
CO2: 27 mmol/L (ref 22–32)
Calcium: 7 mg/dL — ABNORMAL LOW (ref 8.9–10.3)
Chloride: 100 mmol/L (ref 98–111)
Creatinine, Ser: 1.35 mg/dL — ABNORMAL HIGH (ref 0.61–1.24)
GFR, Estimated: >60 mL/min (ref >60)
Glucose, Bld: 189 mg/dL — ABNORMAL HIGH (ref 70–99)
Potassium: 4.6 mmol/L (ref 3.5–5.1)
Sodium: 134 mmol/L — ABNORMAL LOW (ref 135–145)

## 2021-12-25 LAB — GLUCOSE, CAPILLARY
Glucose-Capillary: 186 mg/dL — ABNORMAL HIGH (ref 70–99)
Glucose-Capillary: 272 mg/dL — ABNORMAL HIGH (ref 70–99)
Glucose-Capillary: 336 mg/dL — ABNORMAL HIGH (ref 70–99)
Glucose-Capillary: 332 mg/dL — ABNORMAL HIGH (ref 70–99)

## 2021-12-25 LAB — CBC
HCT: 27.1 % — ABNORMAL LOW (ref 39.0–52.0)
Hemoglobin: 8.9 g/dL — ABNORMAL LOW (ref 13.0–17.0)
MCH: 24.3 pg — ABNORMAL LOW (ref 26.0–34.0)
MCHC: 32.8 g/dL (ref 30.0–36.0)
MCV: 73.8 fL — ABNORMAL LOW (ref 80.0–100.0)
Platelets: 253 10*3/uL (ref 150–400)
RBC: 3.67 MIL/uL — ABNORMAL LOW (ref 4.22–5.81)
RDW: 26.3 % — ABNORMAL HIGH (ref 11.5–15.5)
WBC: 7.7 10*3/uL (ref 4.0–10.5)
nRBC: 0 % (ref 0.0–0.2)

## 2021-12-25 LAB — BPAM RBC
Blood Product Expiration Date: 202303272359
Blood Product Expiration Date: 202304102359
ISSUE DATE / TIME: 202303181454
ISSUE DATE / TIME: 202303201012
Unit Type and Rh: 5100
Unit Type and Rh: 5100

## 2021-12-25 LAB — PHOSPHORUS: Phosphorus: 2.2 mg/dL — ABNORMAL LOW (ref 2.5–4.6)

## 2021-12-25 LAB — MAGNESIUM: Magnesium: 1.2 mg/dL — ABNORMAL LOW (ref 1.7–2.4)

## 2021-12-25 MED ORDER — K PHOS MONO-SOD PHOS DI & MONO 155-852-130 MG PO TABS
500.0000 mg | ORAL_TABLET | Freq: Once | ORAL | Status: AC
  Administered 2021-12-25: 500 mg via ORAL
  Filled 2021-12-25: qty 2

## 2021-12-25 MED ORDER — COSYNTROPIN 0.25 MG IJ SOLR
0.2500 mg | Freq: Once | INTRAMUSCULAR | Status: AC
  Administered 2021-12-26: 0.25 mg via INTRAVENOUS
  Filled 2021-12-25: qty 0.25

## 2021-12-25 MED ORDER — INSULIN GLARGINE-YFGN 100 UNIT/ML ~~LOC~~ SOLN
5.0000 [IU] | Freq: Every day | SUBCUTANEOUS | Status: DC
  Filled 2021-12-25: qty 0.05

## 2021-12-25 MED ORDER — MAGNESIUM SULFATE 2 GM/50ML IV SOLN
2.0000 g | Freq: Once | INTRAVENOUS | Status: AC
  Administered 2021-12-25: 2 g via INTRAVENOUS
  Filled 2021-12-25: qty 50

## 2021-12-25 MED ORDER — INSULIN GLARGINE-YFGN 100 UNIT/ML ~~LOC~~ SOLN
5.0000 [IU] | Freq: Every day | SUBCUTANEOUS | Status: DC
  Administered 2021-12-25 – 2021-12-30 (×6): 5 [IU] via SUBCUTANEOUS
  Filled 2021-12-25 (×7): qty 0.05

## 2021-12-25 MED ORDER — INSULIN GLARGINE 100 UNIT/ML SOLOSTAR PEN: 5.0000 [IU] | PEN_INJECTOR | Freq: Every evening | SUBCUTANEOUS | Status: DC

## 2021-12-25 NOTE — Consult Note (Signed)
[facility] Nurse Consult Note: ? ?Reconsulted for the same problem for which I saw on Monday, 12/24/21. Please see note from that encounter.  ? ?No new orders. ? ?[facility] nursing team will not follow, but will remain available to this patient, the nursing and medical teams.  Please re-consult if needed. ?Thanks, ?Maudie Flakes, MSN, RN, [place], [place], CWON-AP, [place]  ?Pager# [phone]  ?

## 2021-12-25 NOTE — Consult Note (Signed)
?Cardiology Consultation:  ? ?Patient ID: Jeffrey [name] ?MRN: [id number]; DOB: 01-10-1966 ? ?Admit date: 12/20/2021 ?Date of Consult: 12/25/2021 ? ?PCP:  [name], MD ?  ?[facility] Providers ?Cardiologist:  None      ? ? ?Patient Profile:  ? ?Jeffrey [name] is a 56 y.o. male with a hx of syncope who is being seen 12/25/2021 for the evaluation of syncope at the request of Dr. Ardelia Mems. ? ?History of Present Illness:  ? ?[name] is a 56 year old male with recurrent syncope.  I had actually seen him 6 years ago in the office in 2017 for syncope and falls with prodrome of shaking then loss of ability to use his lower extremities and he falls without loss of consciousness.  Other times he may lose gradual consciousness and go to the ground.  Did not sound like a cardiac arrhythmia at the time and I checked a 30-day event monitor to prove that he did not have any adverse arrhythmias. ? ?Orthostatics were also performed in the office setting which were borderline abnormal.  I have not seen him since then. ? ?He came in about 5 days ago with generalized weakness falls in the setting of starting losartan with hypoglycemia anemia and poor appetite.  He has had prior MI with cardiac arrest apparently anoxic brain injury alcohol liver cirrhosis. ? ?He had dizziness and orthostatics when working with physical therapy.  He appeared euvolemic.  Echocardiogram once again reassuring. Echocardiogram here shows normal ejection fraction with mild LVH. ? ?He was started on amlodipine 2.5 mg.  It was felt that his deconditioning and may be prolonged immobility or causes of orthostatic hypotension.  He appears euvolemic. ? ?He also received 1 unit of packed red blood cell with hemoglobin of 9.6.  No signs of bleeding. ? ?He was not hypoglycemic.  Most of his CBGs have been in the 200 range. ? ?Currently feels well about to eat dinner.  Told me about how his blood pressure was dropping when he was standing. ? ?Past Medical  History:  ?Diagnosis Date  ? Acute renal insufficiency 12/08/2012  ? AKI (acute kidney injury) ([place])   ? ALCOHOL ABUSE, HX OF 11/10/2007  ? Anoxic encephalopathy ([place])   ? Anxiety   ? Bleeding ulcer 2014  ? Blood transfusion 2014  ? "related to bleeding ulcer"  ? Cardiac arrest ([place]) 12/07/2012  ? Anoxic encephalopathy  ? GERD (gastroesophageal reflux disease)   ? High cholesterol   ? Hypertension   ? OSA on CPAP 12/07/2012  ? Seizure-like activity ([place])   ? Seizures ([place])   ? last seizure 01/05/2021  ? Type II diabetes mellitus ([place])   ? Venous insufficiency 12/13/2011  ? ? ?Past Surgical History:  ?Procedure Laterality Date  ? BIOPSY  11/01/2020  ? Procedure: BIOPSY;  Surgeon: Irene Shipper, MD;  Location: [facility];  Service: Endoscopy;;  ? CARDIOVASCULAR STRESS TEST  03/16/13  ? Very Poor Exercise Tolerance; NON DIAGNOSTIC TEST  ? CATARACT EXTRACTION W/ INTRAOCULAR LENS IMPLANT Left 03/07/2014  ? [place] @ [facility]  ? COLONOSCOPY WITH PROPOFOL N/A 12/23/2021  ? Procedure: COLONOSCOPY WITH PROPOFOL;  Surgeon: Irene Shipper, MD;  Location: [facility];  Service: Gastroenterology;  Laterality: N/A;  ? ESOPHAGOGASTRODUODENOSCOPY Left 11/26/2012  ? Procedure: ESOPHAGOGASTRODUODENOSCOPY (EGD);  Surgeon: Wonda Horner, MD;  Location[facility] ENDOSCOPY;  Service: Endoscopy;  Laterality: Left;  ? ESOPHAGOGASTRODUODENOSCOPY N/A 10/10/2014  ? Procedure: ESOPHAGOGASTRODUODENOSCOPY (EGD);  Surgeon: Ladene Artist, MD;  Location: [facility] ENDOSCOPY;  Service: Endoscopy;  Laterality: N/A;  ? ESOPHAGOGASTRODUODENOSCOPY (EGD) WITH PROPOFOL N/A 11/01/2020  ? Procedure: ESOPHAGOGASTRODUODENOSCOPY (EGD) WITH PROPOFOL;  Surgeon: Irene Shipper, MD;  Location: [facility] ENDOSCOPY;  Service: Endoscopy;  Laterality: N/A;  ? ESOPHAGOGASTRODUODENOSCOPY (EGD) WITH PROPOFOL N/A 12/22/2021  ? Procedure: ESOPHAGOGASTRODUODENOSCOPY (EGD) WITH PROPOFOL;  Surgeon: Mauri Pole, MD;  Location: [place];  Service: Gastroenterology;  Laterality: N/A;   ? KNEE ARTHROSCOPY Left ~ 1999  ? UPPER GASTROINTESTINAL ENDOSCOPY    ?  ? ?Home Medications:  ?Prior to Admission medications   ?Medication Sig Start Date End Date Taking? Authorizing Provider  ?aspirin EC 81 MG tablet Take 1 tablet (81 mg total) by mouth at bedtime. 06/24/19  Yes Hensel, Jamal Collin, MD  ?Dulaglutide (TRULICITY) 7.12 WP/8.0DX SOPN Inject 0.75 mg into the skin once a week. ?Patient taking differently: Inject 0.75 mg into the skin every Wednesday. 11/29/21  Yes Leavy Cella, RPH-CPP  ?DULoxetine (CYMBALTA) 30 MG capsule Take 1 capsule (30 mg total) by mouth daily. ?Patient taking differently: Take 30 mg by mouth at bedtime. 11/22/21  Yes Hensel, Jamal Collin, MD  ?Ensure Max Protein (ENSURE MAX PROTEIN) LIQD Take 330 mLs (11 oz total) by mouth 3 (three) times daily between meals. 08/10/21  Yes Sowell, Erlene Quan, MD  ?folic acid (FOLVITE) 1 MG tablet Take 1 tablet by mouth once daily ?Patient taking differently: Take 1 mg by mouth at bedtime. 10/02/21  Yes [name], MD  ?furosemide (LASIX) 20 MG tablet Take 40 mg by mouth in the morning and at bedtime.   Yes [provider]  ?gabapentin (NEURONTIN) 300 MG capsule TAKE 3 CAPSULES BY MOUTH THREE TIMES DAILY ?Patient taking differently: Take 900 mg by mouth 3 (three) times daily. 11/05/21  Yes [name], MD  ?LANTUS SOLOSTAR 100 UNIT/ML Solostar Pen Inject 5 Units into the skin every evening.   Yes [provider]  ?levETIRAcetam (KEPPRA) 1000 MG tablet Take 1 tablet (1,000 mg total) by mouth 2 (two) times daily. 09/17/21  Yes Samuella Cota, MD  ?metFORMIN (GLUCOPHAGE-XR) 500 MG 24 hr tablet TAKE 1 TABLET BY MOUTH TWICE DAILY WITH A MEAL ?Patient taking differently: Take 500 mg by mouth in the morning and at bedtime. 12/04/21  Yes [name], MD  ?mirtazapine (REMERON) 30 MG tablet Take 1 tablet (30 mg total) by mouth at bedtime. 10/02/21  Yes [name], MD  ?Multiple Vitamin (MULTIVITAMIN WITH MINERALS) TABS  tablet Take 1 tablet by mouth daily. ?Patient taking differently: Take 1 tablet by mouth daily with breakfast. 08/11/21  Yes Sowell, Erlene Quan, MD  ?pantoprazole (PROTONIX) 40 MG tablet Take 1 tablet (40 mg total) by mouth 2 (two) times daily. ?Patient taking differently: Take 40 mg by mouth at bedtime. 09/17/21 12/27/21 Yes Samuella Cota, MD  ?thiamine (VITAMIN B-1) 100 MG tablet Take 1 tablet (100 mg total) by mouth daily. ?Patient taking differently: Take 100 mg by mouth at bedtime. 05/27/16  Yes Ronnie Doss M, DO  ?blood glucose meter kit and supplies Dispense based on patient and insurance preference. Use up to four times daily as directed. (FOR ICD-10 E10.9, E11.9). dispense 90 test strips and 90 lancets. 09/17/21   Samuella Cota, MD  ?Continuous Blood Gluc Sensor (FREESTYLE LIBRE 2 SENSOR) MISC Use as directed ?Patient not taking: Reported on 12/20/2021 10/02/21   [name], MD  ?furosemide (LASIX) 40 MG tablet Take 1 tablet by mouth once daily ?Patient not taking: Reported on 12/20/2021 10/02/21  [name], MD  ?losartan (COZAAR) 50 MG tablet Take 1 tablet (50 mg total) by mouth at bedtime. ?Patient not taking: Reported on 12/20/2021 11/19/21   [name], MD  ? ? ?Inpatient Medications: ?Scheduled Meds: ? sodium chloride   Intravenous Once  ? aspirin EC  81 mg Oral QHS  ? DULoxetine  30 mg Oral QHS  ? feeding supplement  237 mL Oral TID BM  ? folic acid  1 mg Oral QHS  ? gabapentin  900 mg Oral TID  ? hydrocerin   Topical Daily  ? insulin aspart  0-6 Units Subcutaneous TID WC  ? insulin glargine-yfgn  5 Units Subcutaneous QHS  ? levETIRAcetam  1,000 mg Oral BID  ? multivitamin with minerals  1 tablet Oral Daily  ? pantoprazole  40 mg Oral QHS  ? thiamine  100 mg Oral QHS  ? ?Continuous Infusions: ? ?PRN Meds: ?polyethylene glycol ? ?Allergies:    ?Allergies  ?Allergen Reactions  ? Atorvastatin Other (See Comments)  ?  Cause Muscle Weakness  ? Drug Ingredient [Spironolactone]  Other (See Comments)  ?  Hyperkalemia  ? Lisinopril Other (See Comments)  ?  Pt reports nose bleeds  ? Losartan Other (See Comments)  ?  Patient stopped taking this because he felt like passing out after s

## 2021-12-25 NOTE — Progress Notes (Signed)
Physical Therapy Treatment ?Patient Details ?Name: Jeffrey [name] ?MRN: [id number] ?DOB: June 23, 1966 ?Today's Date: 12/25/2021 ? ? ?History of Present Illness 56 y.o. male presenting with dizziness and falls for past few weeks. PMH is significant for HTN, HLD, CKD, MI, cardiac arrest, anoxic brain injury, T2DM, GERD, cirrhosis, Depression, anemia of chronic disease ? ?  ?PT Comments  ? ? Pt was in bed upon arrival, and motivated to get up and attempt walking again. Pt required minimal assistance getting into sitting position from supine, but also in process of waking up.  ? ?Pt education was provided on the use of a rollator due to Pt expressing they did not know how to use it properly. Orthostatics were measured (see previous note for values). Pt reported light-headedness during the"Standing 0 minutes" reading, and stated they were "feeling different" and felt "lightheaded"; Pt remained standing and marched which alleviated the symptoms. Before walking into the hallway, Pt was instructed to communicate symptoms to PT efficiently to be able to educate Pt on how to properly navigate turning to sit in the rollator, and Pt was responsive to instructions. Pt walked about 72f while talking with no breaks; began to slur speech partially so vitals were taken (BP = 76/35 mmHg, Pulse = 33 bpm, MAP =48); Notified RN and MD; Pt remained standing during reading. After reading, Pt was was given cues to navigate turn to sit in rollator, and Pt stated they "felt good". Pt was then transferred into recliner needing max assist 2+; Pt reported transfer felt "rough".  ? ?Pt pre-syncopal symptoms are delayed which may correlate to history of falls and reduced awareness of dizziness and light-headedness. Slurred speech is also a symptoms that is noticeable when Pt begins to feel fatigued. Pt had increased confidence in rollator navigation and appears to be more bought-in to using for ambulation.   ?Recommendations for follow up therapy  are one component of a multi-disciplinary discharge planning process, led by the attending physician.  Recommendations may be updated based on patient status, additional functional criteria and insurance authorization. ? ?Follow Up Recommendations ? Outpatient PT ?  ?  ?Assistance Recommended at Discharge Intermittent Supervision/Assistance  ?Patient can return home with the following A lot of help with walking and/or transfers ?  ?Equipment Recommendations ? Rolling walker (2 wheels);Rollator (4 wheels)  ?  ?Recommendations for Other Services   ? ? ?  ?Precautions / Restrictions Precautions ?Precautions: Fall;Other (comment) ?Precaution Comments: monitor orthostatics ?Restrictions ?Weight Bearing Restrictions: No  ?  ? ?Mobility ? Bed Mobility ?Overal bed mobility: Modified Independent ?  ?  ?  ?  ?  ?  ?General bed mobility comments: In bed upon arrival ?  ? ?Transfers ?Overall transfer level: Needs assistance ?Equipment used: Rollator (4 wheels) ?Transfers: Sit to/from Stand ?Sit to Stand: Min guard ?  ?  ?  ?  ?  ?General transfer comment: use of momentum and increased time to complete ?  ? ?Ambulation/Gait ?Ambulation/Gait assistance: Min guard ?Gait Distance (Feet): 50 Feet ?Assistive device: Rollator (4 wheels) ?Gait Pattern/deviations: Trunk flexed (Given cues to maintain posture during walk; was responsive to cues and maintained posture for majority of walk.) ?Gait velocity: Decreased ?  ?Pre-gait activities: Orthostatics (Education about symtpoms awareness and instruction to communicate symtpoms during walk; specifically lightheadedness) ?General Gait Details: Pt is able to have conversations while walking, but will noticably begin to slur words partially when he becomes fatigued. ? ? ?Stairs ?  ?  ?  ?  ?  ? ? ?  Wheelchair Mobility ?  ? ?Modified Rankin (Stroke Patients Only) ?  ? ? ?  ?Balance Overall balance assessment: Needs assistance, History of Falls ?Sitting-balance support: No upper extremity  supported, Feet supported ?Sitting balance-Leahy Scale: Good ?  ?  ?  ?  ?  ?  ?  ?  ?  ?  ?  ?  ?  ?  ?  ?  ?  ? ?  ?Cognition Arousal/Alertness: Awake/alert ?Behavior During Therapy: [facility] for tasks assessed/performed ?Overall Cognitive Status: History of cognitive impairments - at baseline ?  ?  ?  ?  ?  ?  ?  ?  ?  ?  ?  ?  ?  ?  ?  ?  ?General Comments: hx of anoxic brain injury; pleasant and appropriate throughout session; shows insight into deficits and safety concerns ?  ?  ? ?  ?Exercises   ? ?  ?General Comments   ?  ?  ? ?Pertinent Vitals/Pain Pain Assessment ?Pain Assessment: No/denies pain  ? ? ?Home Living   ?  ?  ?  ?  ?  ?  ?  ?  ?  ?   ?  ?Prior Function    ?  ?  ?   ? ?PT Goals (current goals can now be found in the care plan section) Acute Rehab PT Goals ?PT Goal Formulation: With patient ?Time For Goal Achievement: 01/04/22 ?Potential to Achieve Goals: Good ?Progress towards PT goals: Progressing toward goals ? ?  ?Frequency ? ? ? Min 3X/week ? ? ? ?  ?PT Plan Current plan remains appropriate  ? ? ?Co-evaluation   ?  ?  ?  ?  ? ?  ?AM-PAC PT "6 Clicks" Mobility   ?Outcome Measure ? Help needed turning from your back to your side while in a flat bed without using bedrails?: None ?Help needed moving from lying on your back to sitting on the side of a flat bed without using bedrails?: None ?Help needed moving to and from a bed to a chair (including a wheelchair)?: A Little ?Help needed standing up from a chair using your arms (e.g., wheelchair or bedside chair)?: A Little ?Help needed to walk in hospital room?: A Little ?Help needed climbing 3-5 steps with a railing? : Total ?6 Click Score: 18 ? ?  ?End of Session Equipment Utilized During Treatment: Gait belt ?Activity Tolerance: Patient limited by fatigue;Treatment limited secondary to medical complications (Comment) ?Patient left: in chair;with call bell/phone within reach;with chair alarm set ?Nurse Communication: Mobility status (Discussed BP  measurements in response to exercise.) ?PT Visit Diagnosis: Unsteadiness on feet (R26.81);Muscle weakness (generalized) (M62.81);History of falling (Z91.81);Other abnormalities of gait and mobility (R26.89) ?  ? ? ?Time: 1010-1055 ?PT Time Calculation (min) (ACUTE ONLY): 45 min ? ?Charges:  $Gait Training: 23-37 mins ?$Therapeutic Activity: 8-22 mins          ?          ? ?Artis Flock, SPT ?Acute Rehab Services ?Office: [phone]  ? ? ?Artis Flock ?12/25/2021, 1:32 PM ? ?

## 2021-12-25 NOTE — Progress Notes (Addendum)
?  Physical Therapy Note:  ? ? ?(Full PT note to follow) ? ? ? 12/25/21 1045 12/25/21 1051 12/25/21 1052  ?Orthostatic Lying (pre-exercise)  ?BP- Lying (!) 158/106 ?(MAP = 124 , SpO2=100)  --   --   ?Pulse- Lying 93  --   --   ?Orthostatic Sitting  ?BP- Sitting (!) 141/103 ?(SpO2=100) 112/88 ?(Sitting in rollator after hypotensive episode, Pt reported feeling good.) (!) 142/105 ?(Transfered from rollator to recliner; max assist 2+; Pt reported the transfer felt rough)  ?Pulse- Sitting 99 ?(MAP = 115) 102 ?(MAP = 96) 99 ?(MAP = 117)  ?Orthostatic Standing at 0 minutes  ?BP- Standing at 0 minutes 108/83 ?("feels different/lightheaded")  --   --   ?Pulse- Standing at 0 minutes 105 (MAP = 91 )  --   --   ?Orthostatic Standing at 3 and approximately 6 minutes  ?BP- Standing at 3 minutes 101/72 ?(SpO2=100 - no symptoms) (!) 76/35  --   ?Pulse- Standing at 3 minutes 110 (MAP = 81) (!) 33 ?(MAP = 48)  --   ? ? ?Artis Flock, SPT ?Acute Rehab Services ?Office: [phone]  ? ?

## 2021-12-25 NOTE — Progress Notes (Signed)
Inpatient Diabetes Program Recommendations ? ?AACE/ADA: New Consensus Statement on Inpatient Glycemic Control  ? ?Target Ranges:  Prepandial:   less than 140 mg/dL ?     Peak postprandial:   less than 180 mg/dL (1-2 hours) ?     Critically ill patients:  140 - 180 mg/dL  ? ? Latest Reference Range & Units 12/24/21 07:21 12/24/21 11:19 12/24/21 17:10 12/24/21 21:19 12/25/21 07:41  ?Glucose-Capillary 70 - 99 mg/dL 229 (H) 255 (H) 270 (H) 305 (H) 186 (H)  ? ?Review of Glycemic Control\ ? ?Diabetes history: DM2 ?Outpatient Diabetes medications: Lantus 5 units QPM, Metformin 030 mg BID, Trulicity 0.92 mg Qweek (Wednesday) ?Current orders for Inpatient glycemic control: Semglee 5 units QHS, Novolog 0-6 units TID with meals ?  ?Inpatient Diabetes Program Recommendations:   ? ?Insulin: Please consider ordering Novolog 0-5 units QHS for bedtime correction. If post prandial glucose remains elevated, may need to add meal coverage insulin as well. ?  ?Diet: Please consider discontinuing Regular diet and ordering Carb Modified. ?  ?Thanks, ?Barnie Alderman, RN, MSN, CDE ?Diabetes Coordinator ?Inpatient Diabetes Program ?[phone] (Team Pager from 8am to 5pm) ? ? ? ?

## 2021-12-25 NOTE — Anesthesia Postprocedure Evaluation (Signed)
Anesthesia Post Note ? ?Patient: Jeffrey [name] ? ?Procedure(s) Performed: COLONOSCOPY WITH PROPOFOL ? ?  ? ?Patient location during evaluation: Endoscopy ?Anesthesia Type: MAC ?Level of consciousness: awake and alert ?Pain management: pain level controlled ?Vital Signs Assessment: post-procedure vital signs reviewed and stable ?Respiratory status: spontaneous breathing, nonlabored ventilation, respiratory function stable and patient connected to nasal cannula oxygen ?Cardiovascular status: stable and blood pressure returned to baseline ?Postop Assessment: no apparent nausea or vomiting ?Anesthetic complications: no ? ? ?No notable events documented. ? ?Last Vitals:  ?Vitals:  ? 12/24/21 2113 12/25/21 0533  ?BP: (!) 165/108 (!) 145/111  ?Pulse: 95 96  ?Resp: 18 17  ?Temp: 36.6 ?C 36.5 ?C  ?SpO2: 100% 100%  ?  ?Last Pain:  ?Vitals:  ? 12/24/21 2113  ?TempSrc: Oral  ?PainSc:   ? ? ?  ?  ?  ?  ?  ?  ? ?[place] ? ? ? ? ?

## 2021-12-25 NOTE — Progress Notes (Signed)
FPTS Brief Progress Note ? ?S: Patient is sitting in chair doing well.  Had long discussion regarding plans for discharge.  He reports that he is going to try to start eating more regular meals after discharge and will closely monitor his sugars to determine if he needs long-acting insulin restarted.  He will also monitor his blood pressures. ? ? ?O: ?BP (!) 164/107 (BP Location: Right Arm)   Pulse 94   Temp 98 ?F (36.7 ?C) (Oral)   Resp 18   Ht '5\' 10"'$  (1.778 m)   Wt 102.1 kg   SpO2 100%   BMI 32.28 kg/m?   ? ? ?A/P: ?Patient saw cardiology today who recommended ACTH stimulation test.  This has been ordered.  No other changes to day team plans. ?- Orders reviewed. Labs for AM ordered, which was adjusted as needed.  ? ?[name], MD ?12/25/2021, 9:30 PM ?PGY-3, [place] Night Resident  ?Please page [phone] with questions.  ?  ?

## 2021-12-25 NOTE — Progress Notes (Addendum)
Family Medicine Teaching Service ?Daily Progress Note ?Intern Pager: [phone] ? ?Patient name: Jeffrey [name] Medical record number: [id number] ?Date of birth: [birth year] Age: 56 y.o. Gender: male ? ?Primary Care Provider: [name], MD ?Consultants: Gastroenterology ?Code Status: Full ? ?Pt Overview and Major Events to Date:  ?12/20/21: Admitted ? ?LOS: 5 days ? ?Assessment and Plan: ?Jeffrey [name] is a 56 year-old male who was admitted for generalzied weakness, falls in the setting of starting Losartan, hypoglycemia, anemia and poor appetite. PMH significant for hypertension, hyperlipidemia, CKD, MI s/p cardiac arrest, anoxic brain injury, T2DM, GERD, ETOH liver cirrhosis, depression, anemia of chronic disease. ? ?Generalized weakness and falls  Orthostatic hypotension ?Echo LVEF 65-70%, GIDD ?Had dizziness/ + orthostatics when working with PT yesterday. Appears euvolemc, likely not 2/2 dehydration. Echocardiogram unchanged from prior in 07/2021, LVEF 65-70% with normal LV function and no regional wall motion abnormalities; GIDD. Allowing for hypertension as we do not want to bottom out his blood pressure with tight control. Started on Amlodipine 2.'5mg'$  yesterday, likely will not see effect yet. I think it is likely that deconditioning and prolonged immobility are causes of his orthostatic hypotension. He is not intravascularly depleted, does not have major electrolyte abnormalities and he is not taking any medications that would be likely culprit. I also think his anemia is a major contributing factor. ?- Monitor VS ?- Fall precautions ?- Continue Amlodipine 2.'5mg'$  daily ?- PT/OT ?- Ensure TID ?- F/u echocardiogram ? ?T2DM ?CBG mostly in 200s, some in 300s. No glucose <90. ?- Start 5 units Lantus daily at bedtime ?- CBG 4 times daily, at meals and bedtime ?- Very sensitive sliding scale insulin ? ?Iron deficiency anemia w/ hx anemia of chronic disease ?ETOH liver cirrhosis ?Hx erosive esophagitis ?Hgb 9.6.  S/p 1u pRBC yesterday, 2 total this admission. No signs of bleeding, EGD and colonoscopy unremarkable. ?- Monitor with daily labs ?- GI following, appreciate care ? ? ?FEN/GI: Regular diet ?PPx: SCDs ?Dispo:Home with home health  pending clinical improvement . Barriers include orthostatic hypotension/fall risk.  ? ?Subjective:  ?Feels well this morning laying in bed. Says he gets dizzy with walking, but not at any other time. Denies chest pain, nausea when this happens. Says he also starts shaking "like a seizure" with the dizziness. At home, he is able to walk around the house without difficulty and does not have stairs. He brings a rollator with him when he goes in public. He tells me he wont go home unless these issues resolve. He was mad that he got sent to rehab after his last hospitalization and does not want to do that again, he wants to go home at discharge. ? ?Objective: ?Temp:  [97.3 ?F (36.3 ?C)-97.8 ?F (36.6 ?C)] 97.7 ?F (36.5 ?C) (03/21 0533) ?Pulse Rate:  [84-96] 96 (03/21 0533) ?Resp:  [16-18] 17 (03/21 0533) ?BP: (134-165)/(94-111) 145/111 (03/21 0533) ?SpO2:  [99 %-100 %] 100 % (03/21 0533) ?Physical Exam: ?General: Laying in bed, comfortable, eating breakfast ?Cardiovascular: RRR ?Respiratory: Normal work of breathing on room air. Clear lungs in all fields ?Abdomen: Soft, non-tender, non-distended, normal/active bowel sounds ?Extremities: Warm, well-perfused ? ?Laboratory: ?Recent Labs  ?Lab 12/23/21 ?0004 12/24/21 ?0120 12/24/21 ?0636 12/24/21 ?1417  ?WBC 6.2 8.0 6.1  --   ?HGB 8.1* 7.2* 7.3* 9.6*  ?HCT 25.4* 21.8* 22.3* 29.2*  ?PLT 342 258 257  --   ? ?Recent Labs  ?Lab 12/20/21 ?1547 12/20/21 ?1600 12/23/21 ?0004 12/24/21 ?0120 12/24/21 ?0636  ?NA 137   < >  136 136 137  ?K 4.8   < > 4.9 5.5* 4.6  ?CL 97*   < > 99 103 101  ?CO2 28   < > '24 23 28  '$ ?BUN 18   < > '13 16 15  '$ ?CREATININE 1.83*   < > 1.26* 1.64* 1.54*  ?CALCIUM 7.1*   < > 7.1* 7.0* 7.0*  ?PROT 5.7*  --   --   --   --   ?BILITOT 0.5  --    --   --   --   ?ALKPHOS 237*  --   --   --   --   ?ALT 42  --   --   --   --   ?AST 44*  --   --   --   --   ?GLUCOSE 335*   < > 268* 256* 230*  ? < > = values in this interval not displayed.  ? ? ?Imaging/Diagnostic Tests: ?No results found. ? ? ?Orvis Brill, DO ?12/25/2021, 8:04 AM ?PGY-1, [place] [facility] ?[place] Intern pager: [phone], text pages welcome ? ?

## 2021-12-25 NOTE — Plan of Care (Signed)

## 2021-12-26 ENCOUNTER — Other Ambulatory Visit (HOSPITAL_COMMUNITY): Payer: Self-pay

## 2021-12-26 DIAGNOSIS — E119 Type 2 diabetes mellitus without complications: Secondary | ICD-10-CM

## 2021-12-26 DIAGNOSIS — R531 Weakness: Secondary | ICD-10-CM | POA: Diagnosis not present

## 2021-12-26 DIAGNOSIS — Z794 Long term (current) use of insulin: Secondary | ICD-10-CM

## 2021-12-26 DIAGNOSIS — R197 Diarrhea, unspecified: Secondary | ICD-10-CM | POA: Diagnosis not present

## 2021-12-26 DIAGNOSIS — N179 Acute kidney failure, unspecified: Secondary | ICD-10-CM | POA: Diagnosis not present

## 2021-12-26 LAB — BASIC METABOLIC PANEL
Anion gap: 8 (ref 5–15)
BUN: 16 mg/dL (ref 6–20)
CO2: 28 mmol/L (ref 22–32)
Calcium: 7 mg/dL — ABNORMAL LOW (ref 8.9–10.3)
Chloride: 101 mmol/L (ref 98–111)
Creatinine, Ser: 1.25 mg/dL — ABNORMAL HIGH (ref 0.61–1.24)
GFR, Estimated: >60 mL/min (ref >60)
Glucose, Bld: 172 mg/dL — ABNORMAL HIGH (ref 70–99)
Potassium: 4.4 mmol/L (ref 3.5–5.1)
Sodium: 137 mmol/L (ref 135–145)

## 2021-12-26 LAB — GLUCOSE, CAPILLARY
Glucose-Capillary: 105 mg/dL — ABNORMAL HIGH (ref 70–99)
Glucose-Capillary: 241 mg/dL — ABNORMAL HIGH (ref 70–99)
Glucose-Capillary: 275 mg/dL — ABNORMAL HIGH (ref 70–99)
Glucose-Capillary: 318 mg/dL — ABNORMAL HIGH (ref 70–99)

## 2021-12-26 LAB — CBC
HCT: 25.7 % — ABNORMAL LOW (ref 39.0–52.0)
Hemoglobin: 8.3 g/dL — ABNORMAL LOW (ref 13.0–17.0)
MCH: 24.5 pg — ABNORMAL LOW (ref 26.0–34.0)
MCHC: 32.3 g/dL (ref 30.0–36.0)
MCV: 75.8 fL — ABNORMAL LOW (ref 80.0–100.0)
Platelets: 218 10*3/uL (ref 150–400)
RBC: 3.39 MIL/uL — ABNORMAL LOW (ref 4.22–5.81)
RDW: 26.5 % — ABNORMAL HIGH (ref 11.5–15.5)
WBC: 8.3 10*3/uL (ref 4.0–10.5)
nRBC: 0 % (ref 0.0–0.2)

## 2021-12-26 LAB — ACTH STIMULATION, 3 TIME POINTS
Cortisol, 30 Min: 14.8 ug/dL
Cortisol, 60 Min: 16.4 ug/dL
Cortisol, Base: 6.9 ug/dL

## 2021-12-26 LAB — MAGNESIUM: Magnesium: 1.2 mg/dL — ABNORMAL LOW (ref 1.7–2.4)

## 2021-12-26 LAB — PHOSPHORUS: Phosphorus: 2.4 mg/dL — ABNORMAL LOW (ref 2.5–4.6)

## 2021-12-26 MED ORDER — MAGNESIUM SULFATE 2 GM/50ML IV SOLN
2.0000 g | Freq: Once | INTRAVENOUS | Status: AC
  Administered 2021-12-26: 2 g via INTRAVENOUS
  Filled 2021-12-26: qty 50

## 2021-12-26 MED ORDER — LACTATED RINGERS IV SOLN: INTRAVENOUS | Status: DC

## 2021-12-26 MED ORDER — K PHOS MONO-SOD PHOS DI & MONO 155-852-130 MG PO TABS
500.0000 mg | ORAL_TABLET | Freq: Once | ORAL | Status: AC
  Administered 2021-12-26: 500 mg via ORAL
  Filled 2021-12-26 (×2): qty 2

## 2021-12-26 MED ORDER — FIDAXOMICIN 200 MG PO TABS
200.0000 mg | ORAL_TABLET | Freq: Two times a day (BID) | ORAL | Status: DC
  Administered 2021-12-26 – 2021-12-31 (×11): 200 mg via ORAL
  Filled 2021-12-26 (×12): qty 1

## 2021-12-26 MED ORDER — MAGNESIUM SULFATE 4 GM/100ML IV SOLN: 4.0000 g | Freq: Once | INTRAVENOUS | Status: DC

## 2021-12-26 MED ORDER — MAGNESIUM SULFATE 4 GM/100ML IV SOLN
4.0000 g | Freq: Once | INTRAVENOUS | Status: AC
  Administered 2021-12-26: 4 g via INTRAVENOUS
  Filled 2021-12-26: qty 100

## 2021-12-26 NOTE — Progress Notes (Signed)
FPTS Brief Progress Note ? ?S: Patient was seen resting comfortably in bed.  Overnight nurse at bedside.  Nurse reports patient had an unwitnessed fall where he slipped off recliner to which patient attributes to having too many sheets on the recliner leading to him slip off.  Patient denies being in any pain at this time.  Patient does report he had scuffed his right foot which is evidenced by 2 small wounds on right big toe and second toe on dorsal side.  Patient denies hitting any other part of body including head or back. Patient denies LOC. Patient denies being in any active pain at this time including HA, back pain, ab pain. ? ?Pt reports diarrhea still ongoing. Had no other concerns at this time and all questions regarding his care were addressed. ? ?Night RN had no new concerns at this time. ? ?O: ?BP (!) 166/104 (BP Location: Left Arm)   Pulse (!) 103   Temp 98 ?F (36.7 ?C) (Oral)   Resp 17   Ht '5\' 10"'$  (1.778 m)   Wt 102.1 kg   SpO2 99%   BMI 32.28 kg/m?   ? ?2 small wounds on right big toe and 2nd toe. Wound is red but do not appear to be actively bleeding. No surrounding erythema noted. No other new traumatic wounds noted on remainder of body.  ? ?A/P: ?- Per physical exam and patient's description, unwitnessed fall appears to be mechanical in nature ?- Will continue to monitor ?- Plan per day team including adrenal insufficiency workup, now on LR 125 ml/hr ?- Orders reviewed. Labs for AM ordered, which was adjusted as needed.  ?- If condition changes, plan includes bedside evaluation.  ? ?France Ravens, MD ?12/26/2021, 9:15 PM ?PGY-1, [facility] Night Resident  ?Please page [phone] with questions.  ? ?

## 2021-12-26 NOTE — Progress Notes (Signed)
Inpatient Diabetes Program Recommendations ? ?AACE/ADA: New Consensus Statement on Inpatient Glycemic Control  ? ?Target Ranges:  Prepandial:   less than 140 mg/dL ?     Peak postprandial:   less than 180 mg/dL (1-2 hours) ?     Critically ill patients:  140 - 180 mg/dL  ? ? Latest Reference Range & Units 12/25/21 07:41 12/25/21 11:26 12/25/21 16:55 12/25/21 21:12 12/26/21 07:27  ?Glucose-Capillary 70 - 99 mg/dL 186 (H) 272 (H) 336 (H) 332 (H) 105 (H)  ? ?Review of Glycemic Control ? ?Diabetes history: DM2 ?Outpatient Diabetes medications: Lantus 5 units QPM, Metformin 270 mg BID, Trulicity 3.50 mg Qweek (Wednesday) ?Current orders for Inpatient glycemic control: Semglee 5 units QHS, Novolog 0-6 units TID with meals ?  ?Inpatient Diabetes Program Recommendations:   ?  ?Insulin: Please consider ordering Novolog 3 units TID with meals for meal coverage if patient eats at least 50% of meals. ?  ?Diet: Please consider discontinuing Regular diet and ordering Carb Modified diet. ?  ?Thanks, ?Barnie Alderman, RN, MSN, CDE ?Diabetes Coordinator ?Inpatient Diabetes Program ?[phone] (Team Pager from 8am to 5pm) ? ?

## 2021-12-26 NOTE — TOC Progression Note (Signed)
Transition of Care (TOC) - Progression Note  ? ? ?Patient Details  ?Name: Jeffrey [name] ?MRN: [id number] ?Date of Birth: [birth year] ? ?Transition of Care (TOC) CM/SW Contact  ?Tom-Johnson, Renea Ee, RN ?Phone Number: ?12/26/2021, 4:08 PM ? ?Clinical Narrative:    ? ?Received a TOC consult for Medication Assistance. Patient currently has [employer] and no new medication noted. CM reached out to MD for indication and was told that patient has no coverage because his premium were not paid. CM called [employer] [phone]) and automated answering service confirmed that patient's coverage is currently active since 10/07/21. MD made aware. CM not able to do MATCH due to patient's insurance still active. CM will continue to follow with needs.  ? ?Expected Discharge Plan: Home/Self Care ?Barriers to Discharge: Continued Medical Work up ? ?Expected Discharge Plan and Services ?Expected Discharge Plan: Home/Self Care ?  ?Discharge Planning Services: CM Consult ?Post Acute Care Choice: NA ?Living arrangements for the past 2 months: [place] ?                ?DME Arranged: N/A ?DME Agency: NA ?  ?  ?  ?  ?[place] Agency: NA ?  ?  ?  ? ? ?Social Determinants of Health (SDOH) Interventions ?  ? ?Readmission Risk Interventions ? ?  12/21/2021  ?  3:10 PM 09/17/2021  ? 12:59 PM 08/01/2021  ?  1:24 PM  ?Readmission Risk Prevention Plan  ?Transportation Screening Complete Complete Complete  ?PCP or Specialist Appt within 5-7 Days   Complete  ?PCP or Specialist Appt within 3-5 Days Complete Complete   ?Home Care Screening   Patient refused  ?Medication Review (RN CM)   Complete  ?[place] or Home Care Consult Complete Complete   ?Social Work Consult for [place] Planning/Counseling Complete Complete   ?Palliative Care Screening Not Applicable Not Applicable   ?Medication Review Press photographer) Complete Complete   ? ? ?

## 2021-12-26 NOTE — Plan of Care (Signed)

## 2021-12-26 NOTE — Progress Notes (Addendum)
Physical Therapy Treatment ?Patient Details ?Name: Jeffrey [name] ?MRN: [id number] ?DOB: 10-30-1965 ?Today's Date: 12/26/2021 ? ? ?History of Present Illness 56 y.o. male presenting with dizziness and falls for past few weeks. PMH is significant for HTN, HLD, CKD, MI, cardiac arrest, anoxic brain injury, T2DM, GERD, cirrhosis, Depression, anemia of chronic disease ? ?  ?PT Comments  ? ? Pt was asleep upon arrival, but was willing to participate. Pt was up through the night for other testing and was tired. ? ?Session focused on rollator practice and education to reduce fall risk. Began session by measuring orthostatics (see values below). Once in the standing position, Pt was able to stand for longer than 3 minutes, but began to experience pre-syncopal episode (BP = 94/80 mmHg, Pulse = 120, MAP = 86) and was sat down (BP = 135/95 mmHg, Pulse = 99, MAP = 108). Discussed with Pt their symptoms afterwards, and Pt reported feeling lightheaded before dizzy. Pt feels that they are still able to control themselves when lightheaded, but not dizzy. Symptoms also included slurred speech, which was observed in prior session.  ? ?Pt practiced sitting in rollator, turning, and sitting down onto the bed. We repeated this 4 times and Pt ambulated to the recliner with min guard and sat down. Pt stated they wanted to walk and continue to practice with the rollator to prevent falls. Pt made it 35f; no breaks while talking; and was instructed to turn and sit into the rollator (BP = 149/89 mmHg, Pulse = 96, MAP = 102). Pt was not experiencing symptoms during this portion of the walk. Pt wanted to keep walking and stop halfway (265f to practice rollator navigation once more; this was completed with no symptoms (BP = 132/92 mmHg, Pulse = 96, MAP = 105). After standing up from this portion of the walk, Pt stated they "need to sit down"; Pt locked rollator and began to turn to sit; needed max assistance 2+ to fully turn and sit down during  second episode (BP = 115/67 mmHg, Pulse = 97, MAP = 76). Pt was transferred to recliner and left in their room (BP = 130/103 mmHg). ? ?Pt was motivated to continue practicing with rollator to reduce fall risk, and improvement was observed from an education and physical standpoint. The first episode occurred in a static standing position, and the second was during a walk. We are anticipating Pts syncopal responses are related to and elicited by time in standing. Pt response to pre-syncopal symptoms is improving, but still presents with delayed onset of symptoms.  ? ?PT post DC plan remains the same (return to home, outpatient PT), but it is recommended that Pt inquire about 24-hour assistance/supervision to ensure safety when at home. PT needs to observe Pts ability to sit down prior to pre-syncopal event to be confident in DC at home without help.   ?Recommendations for follow up therapy are one component of a multi-disciplinary discharge planning process, led by the attending physician.  Recommendations may be updated based on patient status, additional functional criteria and insurance authorization. ? ?Follow Up Recommendations ? Outpatient PT ?  ?  ?Assistance Recommended at Discharge Intermittent Supervision/Assistance  ?Patient can return home with the following A lot of help with walking and/or transfers ?  ?Equipment Recommendations ? Rollator (4 wheels)  ?  ?Recommendations for Other Services   ? ? ?  ?Precautions / Restrictions Precautions ?Precautions: Fall;Other (comment) ?Precaution Comments: monitor orthostatics ?Restrictions ?Weight Bearing Restrictions: No  ?  ? ?  Mobility ? Bed Mobility ?Overal bed mobility: Modified Independent ?  ?  ?  ?  ?  ?  ?General bed mobility comments: In bed upon arrival ?  ? ?Transfers ?Overall transfer level: Needs assistance ?Equipment used: Rollator (4 wheels) ?Transfers: Sit to/from Stand (Was able to perform sit to stand independently with min guard into  rollator.) ?Sit to Stand: Min guard ?  ?  ?  ?  ?  ?General transfer comment: use of momentum and increased time to complete; more independent in 12/26/2021 session ?  ? ?Ambulation/Gait ?Ambulation/Gait assistance: Min guard ?Gait Distance (Feet): 75 Feet ?Assistive device: Rollator (4 wheels) ?Gait Pattern/deviations: Trunk flexed (Given cues to maintain upright posture.) ?Gait velocity: Decreased ?  ?Pre-gait activities: Orthostatics (Practiced standing from a sitting position, stnading up tall, and turning to properly sit in the rollator. Reinforced Pt education of pre-syncopal symptoms.) ?General Gait Details: Pt was instructed to continue speaking with PT to monitor slurring of words. ? ? ?Stairs ?  ?  ?  ?  ?  ? ? ?Wheelchair Mobility ?  ? ?Modified Rankin (Stroke Patients Only) ?  ? ? ?  ?Balance Overall balance assessment: Needs assistance, History of Falls ?Sitting-balance support: No upper extremity supported, Feet supported ?Sitting balance-Leahy Scale: Good ?  ?  ?Standing balance support: Bilateral upper extremity supported, During functional activity ?Standing balance-Leahy Scale: Good ?Standing balance comment: able to stand without support, BUE for mobility ?  ?  ?  ?  ?  ?  ?  ?  ?  ?  ?  ?  ? ?  ?Cognition Arousal/Alertness: Awake/alert ?Behavior During Therapy: [facility] for tasks assessed/performed ?Overall Cognitive Status: History of cognitive impairments - at baseline ?  ?  ?  ?  ?  ?  ?  ?  ?  ?  ?  ?  ?  ?  ?  ?  ?General Comments: hx of anoxic brain injury; pleasant and appropriate throughout session; shows insight into deficits and safety concerns ?  ?  ? ?  ?Exercises General Exercises - Lower Extremity ?Hip Flexion/Marching: AROM, Standing, 15 reps (Used as part of circuit to practice transfers into rollator; helped with lght-headedness too) ? ?  ?General Comments   ?  ?  ? ?Pertinent Vitals/Pain Pain Assessment ?Pain Assessment: No/denies pain  ? ? ?Home Living   ?  ?  ?  ?  ?  ?  ?  ?  ?  ?    ?  ?Prior Function    ?  ?  ?   ? ?PT Goals (current goals can now be found in the care plan section) Acute Rehab PT Goals ?PT Goal Formulation: With patient ?Time For Goal Achievement: 01/04/22 ?Potential to Achieve Goals: Good ?Progress towards PT goals: Progressing toward goals ? ?  ?Frequency ? ? ? Min 3X/week ? ? ? ?  ?PT Plan Current plan remains appropriate  ? ? ?Co-evaluation   ?  ?  ?  ?  ? ?  ?AM-PAC PT "6 Clicks" Mobility   ?Outcome Measure ? Help needed turning from your back to your side while in a flat bed without using bedrails?: None ?Help needed moving from lying on your back to sitting on the side of a flat bed without using bedrails?: None ?Help needed moving to and from a bed to a chair (including a wheelchair)?: A Little ?Help needed standing up from a chair using your arms (e.g., wheelchair or bedside chair)?: A  Little ?Help needed to walk in hospital room?: A Little ?Help needed climbing 3-5 steps with a railing? : Total ?6 Click Score: 18 ? ?  ?End of Session Equipment Utilized During Treatment: Gait belt ?Activity Tolerance: Patient limited by fatigue;Treatment limited secondary to medical complications (Comment) ?Patient left: in chair;with call bell/phone within reach;with chair alarm set ?Nurse Communication: Mobility status ?PT Visit Diagnosis: Unsteadiness on feet (R26.81);Muscle weakness (generalized) (M62.81);History of falling (Z91.81);Other abnormalities of gait and mobility (R26.89) ?  ? ? ?Time: 1991-4445 ?PT Time Calculation (min) (ACUTE ONLY): 64 min ? ?Charges:  $Gait Training: 8-22 mins ?$Therapeutic Activity: 23-37 mins ?$Self Care/Home Management: 8-22          ?          ? ?Artis Flock, SPT ?Acute Rehab Services ?Office: [phone]  ? ? ?Artis Flock ?12/26/2021, 2:13 PM ? ?

## 2021-12-26 NOTE — Progress Notes (Signed)
OT Cancellation Note ? ?Patient Details ?Name: Jeffrey [name] ?MRN: [id number] ?DOB: 01-08-1966 ? ? ?Cancelled Treatment:    Reason Eval/Treat Not Completed: Other (comment) Pt currently working with PT. If unable to check back for OT session this PM, will follow-up tomorrow (3/23) ? ?Layla Maw ?12/26/2021, 12:18 PM ?

## 2021-12-26 NOTE — Progress Notes (Signed)
Hx of relapsing c.difficile - recommend empirically treating with fidaxomicin to see if quick improvement like he has had in the past. Would be candidate for FMT, can discuss as outpatient ? ?Elzie Rings Baxter Flattery MD MPH ?[facility] ?[phone] ? ?

## 2021-12-26 NOTE — Progress Notes (Signed)
At around 20:25,RN went to round on patient and found him kneeling on the floor holding on to the recliner,trying to get himself up. When asked what happened,patient said "I slid off this chair,they had too many linens and I just slid off." Patient denies hitting his head.No obvious injury noted. Peripheral IV was out. Slight bleeding noted on old wound on first and second Rt toes. Patient denies any pain. Patient assisted back to bed x3 people. Rt 2nd and 3rd toes cleaned. 20:35 MD,JI happened to come round on patient,MD made aware of patient's fall. No orders received. Patient refused for RN to notify family. Will continue to monitor patient. ?

## 2021-12-26 NOTE — Progress Notes (Addendum)
Family Medicine Teaching Service ?Daily Progress Note ?Intern Pager: [phone] ? ?Patient name: Jeffrey [name] Medical record number: [id number] ?Date of birth: [birth year] Age: 56 y.o. Gender: male ? ?Primary Care Provider: [name], MD ?Consultants: Gastroenterology ?Code Status: Full ? ?Pt Overview and Major Events to Date:  ?12/20/21: Admitted ? ?LOS 7 days ? ?Assessment and Plan: ?Jeffrey [name] is a 56 year-old male who was admitted for generalized weakness, falls now with ongoing orthostatic hypotension and diarrhea. PMH significant for hypertension, hyperlipidemia, CKD, MI s/p cardiac arrest, anoxic brain injury, T2DM, GERD, ETOH liver cirrhosis, depression, anemia of chronic disease. ? ?Generalized weakness and falls  Concern for adrenal insufficiency with orthostatic hypotension ?Feels okay this morning. Awaiting renin, aldosterone labs. Allowing for elevated BP in setting of sever orthostatic hypotension. Cardiology consulted and signed off. ?Possible differential includes dehydration (unlikely given IVF hydration and eating well) vs. Dysautonomia vs. Neurpathy vs. Medication induced.  ?Will discuss with pt's outpatient neurologist regarding current symptoms. ?- Fall precautions ?- Monitor VS ?- TED hose ?- Liberal salt intake, regular diet ?- Slow transitions with rising ?- PT/OT ? ?Fall, right foot swelling with ulcers ?Patient had unwitnessed fall overnight where he slipped off recliner. Left foot this morning with severe swelling, wounds over big and second toe. Reports pain in foot. ?- Right foot x-ray ?- Fall precautions ?- Monitor for signs of infection given chronic venous stasis, T2DM ? ?Diarrhea, likely clostridium dificle ?Electrolyte abnormalities ?Hx of C diff in 07/2021, resolved with 10 day-course Fidaxomicin. ?Discussed history with ID, and recent antibiotic use in December 2022 with recommendation to initiate treatment with Fidaxomicin. Had reported 32 stools last night. Can  consider rectal pouch since he had fall last night. ?Mg wnl this morning. ?- Continue Fidaxomicin, day 2/10 ?- Serial abdominal exams ?- Continue LR IVF to prevent dehydration ?- Am BMP ? ?Anemia of unspecified etiology; s/p3u pRBC this admission ?Hx MGUS ?Hgb 7.9 this morning. No overt signs of bleeding, no melena, hematochezia or hematemesis. Unclear etiology of continued downtrend Hgb. Does have hx of MGUS so could be due to a bone marrow dysfunction.  ?Consulted with pts. Hematologist Dr. Lindi Adie and will order labs per his request. Workup with GI this hospitalization was unremarkable. Will transfuse 1u pRBC. ?- Transfusion threshold <8.0 ?- Consulted with hematology, awaiting further reccs ?- LDH, SPEP, Folate, B12 labs ordered ?- Daily CBC ?- Post-transfusion H/H                                 ? ?FEN/GI: Regular diet ?PPx: SCDs; high bleeding risk ?Dispo:Home with home health  pending clinical improvement . Barriers include continued inpatient workup/clinical stability.  ? ?Subjective:  ?[name] was eating breakfast tray this morning. Denies any nausea, vomiting, hematochezia, hematemesis, melena. Left foot hurts and he told me about his fall last night after slipping out of recliner. He says he had 32 bowel movements overnight and it is "leaking" out of him. ? ?Objective: ?Temp:  [97.9 ?F (36.6 ?C)-98.1 ?F (36.7 ?C)] 98.1 ?F (36.7 ?C) (03/22 0900) ?Pulse Rate:  [87-95] 95 (03/22 0900) ?Resp:  [18-19] 19 (03/22 0900) ?BP: (135-164)/(84-115) 135/84 (03/22 0900) ?SpO2:  [100 %] 100 % (03/22 0900) ?Physical Exam: ?General: Sitting upright in bed eating breakfast ?Cardiovascular: RRR ?Respiratory: Normal work of breathing on room air, no wheezing or crackles ?Abdomen: Soft, non-tender, non-distended, normal bowel sounds ?Extremities: Warm, well-perfused. Right foot with severe swelling  to ankle (non-pitting) with 2 small shallow ulcers over dorsal surface of big and second toe. ? ?Laboratory: ?Recent Labs  ?Lab  12/24/21 ?0636 12/24/21 ?1417 12/25/21 ?5537 12/26/21 ?0428  ?WBC 6.1  --  7.7 8.3  ?HGB 7.3* 9.6* 8.9* 8.3*  ?HCT 22.3* 29.2* 27.1* 25.7*  ?PLT 257  --  253 218  ? ?Recent Labs  ?Lab 12/20/21 ?1547 12/20/21 ?1600 12/24/21 ?0636 12/25/21 ?4827 12/26/21 ?0786  ?NA 137   < > 137 134* 137  ?K 4.8   < > 4.6 4.6 4.4  ?CL 97*   < > 101 100 101  ?CO2 28   < > '28 27 28  '$ ?BUN 18   < > '15 16 16  '$ ?CREATININE 1.83*   < > 1.54* 1.35* 1.25*  ?CALCIUM 7.1*   < > 7.0* 7.0* 7.0*  ?PROT 5.7*  --   --   --   --   ?BILITOT 0.5  --   --   --   --   ?ALKPHOS 237*  --   --   --   --   ?ALT 42  --   --   --   --   ?AST 44*  --   --   --   --   ?GLUCOSE 335*   < > 230* 189* 172*  ? < > = values in this interval not displayed.  ? ? ?Imaging/Diagnostic Tests: ?No results found. ? ? ?Orvis Brill, DO ?12/26/2021, 4:12 PM ?PGY-1, [place] ?[place] Intern pager: [phone], text pages welcome ? ?

## 2021-12-26 NOTE — Progress Notes (Addendum)
Family Medicine Teaching Service ?Daily Progress Note ?Intern Pager: [phone] ? ?Patient name: Jeffrey [name] Medical record number: [id number] ?Date of birth: April 13, 1966 Age: 56 y.o. Gender: male ? ?Primary Care Provider: [name], MD ?Consultants: Gastroenterology ?Code Status: Full ? ?Pt Overview and Major Events to Date:  ?12/20/21: Admitted ? ?LOS: 6 days ? ?Assessment and Plan: ?Jeffrey [name] is a 56 year-old male who was admitted for generalized weakness, falls in setting of starting Losartan, hypoglycemia, anemia and poor appetite. PMH significant for hypertension, hyperlipidemia, CKD, MI s/p cardiac arrest, anoxic brain injury, T2DM, GERD, ETOH liver cirrhosis, depression, anemia of chronic disease. ? ?Generalized weakness and falls, possible adrenal insufficiency  Orthostatic hypotension ?Reports feeling better yesterday with dizziness. Able to eat breakfast. Consulted Cardiology- Dc'ed amlodipine, will allow for elevated blood pressure, add salt liberalization to diet and use TED hose. ?Cortisol levels low on ACTH stim test. Will get am renin/aldosterone tests in morning. ?- Cardiology signed off ?- Hold antihypertensives ?- Monitor VS ?- TED hose ?- Slow transitions with rising ?- PT/OT ? ?Diarrhea; history of Clostridium difficile in past 6 months ?Electrolyte abnormalities  ?Hx of C. Dif in 07/2021, successfully treated with 10 day-course Fidaxomicin.  ?Pt reports 15 episodes of watery diarrhea in past 24 hours. Upon entrance to room, diarrhea was in bed that was foul-smelling. Discussed C. Diff with infectious disease, awaiting recommendations. ?Mg 1.2, replete with IV Mg '4mg'$ .Phos low at 2.4, replete with '500mg'$ . ?- Start IVF to prevent dehydration ?- Replete electrolytes as needed ? ?Type 2 Diabetes Mellitus ? CBGs in 100-200s. No glucose < 90. ?- CBG 4 times daily, at meals and bedtime ?- very sensitive SSI ?- 5 units Lantus at bedtime ? ? ?FEN/GI: Regular diet ?PPx: SCDs ?Dispo:Home with  home health  pending clinical improvement . Barriers include possible C. Dif, further inpatient work-up.  ? ?Subjective:  ?Jeffrey [name] reports profuse, watery diarrhea that he is unable to make it to the bathroom for since it "runs out of me." He denies dizziness or lightheadedness today. He says he has had abdominal cramping. No nausea or vomiting. He says this feels like when he had c. Dif in the past. He says he was able to eat all of his breakfast. Denies any blood in his stool but "didn't look hard." ? ?Objective: ?Temp:  [97.7 ?F (36.5 ?C)-98 ?F (36.7 ?C)] 98 ?F (36.7 ?C) (03/22 3329) ?Pulse Rate:  [87-96] 93 (03/22 0513) ?Resp:  [18] 18 (03/22 0513) ?BP: (136-164)/(85-115) 136/85 (03/22 0513) ?SpO2:  [100 %] 100 % (03/22 0513) ?Physical Exam: ?General: Sitting in chair, comfortable ?Cardiovascular: RRR ?Respiratory: Normal work of breathing on room air, clear in all fields ?Abdomen: Soft, non-tender, non-distended ?Extremities: Warm, well-perfused ? ?Laboratory: ?Recent Labs  ?Lab 12/24/21 ?0636 12/24/21 ?1417 12/25/21 ?5188 12/26/21 ?0428  ?WBC 6.1  --  7.7 8.3  ?HGB 7.3* 9.6* 8.9* 8.3*  ?HCT 22.3* 29.2* 27.1* 25.7*  ?PLT 257  --  253 218  ? ?Recent Labs  ?Lab 12/20/21 ?1547 12/20/21 ?1600 12/24/21 ?0636 12/25/21 ?4166 12/26/21 ?0630  ?NA 137   < > 137 134* 137  ?K 4.8   < > 4.6 4.6 4.4  ?CL 97*   < > 101 100 101  ?CO2 28   < > '28 27 28  '$ ?BUN 18   < > '15 16 16  '$ ?CREATININE 1.83*   < > 1.54* 1.35* 1.25*  ?CALCIUM 7.1*   < > 7.0* 7.0* 7.0*  ?PROT 5.7*  --   --   --   --   ?  BILITOT 0.5  --   --   --   --   ?ALKPHOS 237*  --   --   --   --   ?ALT 42  --   --   --   --   ?AST 44*  --   --   --   --   ?GLUCOSE 335*   < > 230* 189* 172*  ? < > = values in this interval not displayed.  ? ? ? ? ?Imaging/Diagnostic Tests: ?No results found. ? ? ?Jeffrey Brill, DO ?12/26/2021, 7:28 AM ?PGY-1, [name] ?Jeffrey [name] Intern pager: [phone], text pages welcome ? ?

## 2021-12-27 ENCOUNTER — Inpatient Hospital Stay (HOSPITAL_COMMUNITY): Payer: Commercial Managed Care - HMO

## 2021-12-27 DIAGNOSIS — R55 Syncope and collapse: Secondary | ICD-10-CM | POA: Diagnosis not present

## 2021-12-27 DIAGNOSIS — D649 Anemia, unspecified: Secondary | ICD-10-CM | POA: Diagnosis not present

## 2021-12-27 DIAGNOSIS — E119 Type 2 diabetes mellitus without complications: Secondary | ICD-10-CM | POA: Diagnosis not present

## 2021-12-27 DIAGNOSIS — R531 Weakness: Secondary | ICD-10-CM | POA: Diagnosis not present

## 2021-12-27 LAB — GLUCOSE, CAPILLARY
Glucose-Capillary: 165 mg/dL — ABNORMAL HIGH (ref 70–99)
Glucose-Capillary: 216 mg/dL — ABNORMAL HIGH (ref 70–99)
Glucose-Capillary: 225 mg/dL — ABNORMAL HIGH (ref 70–99)
Glucose-Capillary: 245 mg/dL — ABNORMAL HIGH (ref 70–99)

## 2021-12-27 LAB — CBC
HCT: 24.3 % — ABNORMAL LOW (ref 39.0–52.0)
Hemoglobin: 7.9 g/dL — ABNORMAL LOW (ref 13.0–17.0)
MCH: 24.8 pg — ABNORMAL LOW (ref 26.0–34.0)
MCHC: 32.5 g/dL (ref 30.0–36.0)
MCV: 76.2 fL — ABNORMAL LOW (ref 80.0–100.0)
Platelets: 192 10*3/uL (ref 150–400)
RBC: 3.19 MIL/uL — ABNORMAL LOW (ref 4.22–5.81)
RDW: 27.5 % — ABNORMAL HIGH (ref 11.5–15.5)
WBC: 9.5 10*3/uL (ref 4.0–10.5)
nRBC: 0 % (ref 0.0–0.2)

## 2021-12-27 LAB — BASIC METABOLIC PANEL
Anion gap: 5 (ref 5–15)
BUN: 16 mg/dL (ref 6–20)
CO2: 28 mmol/L (ref 22–32)
Calcium: 7.1 mg/dL — ABNORMAL LOW (ref 8.9–10.3)
Chloride: 101 mmol/L (ref 98–111)
Creatinine, Ser: 1.32 mg/dL — ABNORMAL HIGH (ref 0.61–1.24)
GFR, Estimated: >60 mL/min (ref >60)
Glucose, Bld: 190 mg/dL — ABNORMAL HIGH (ref 70–99)
Potassium: 4.4 mmol/L (ref 3.5–5.1)
Sodium: 134 mmol/L — ABNORMAL LOW (ref 135–145)

## 2021-12-27 LAB — LACTATE DEHYDROGENASE: LDH: 465 U/L — ABNORMAL HIGH (ref 98–192)

## 2021-12-27 LAB — RETICULOCYTES
Immature Retic Fract: 42.4 % — ABNORMAL HIGH (ref 2.3–15.9)
RBC.: 3.4 MIL/uL — ABNORMAL LOW (ref 4.22–5.81)
Retic Count, Absolute: 32.3 10*3/uL (ref 19.0–186.0)
Retic Ct Pct: 1 % (ref 0.4–3.1)

## 2021-12-27 LAB — TECHNOLOGIST SMEAR REVIEW: Plt Morphology: NORMAL

## 2021-12-27 LAB — HEMOGLOBIN AND HEMATOCRIT, BLOOD
HCT: 30.5 % — ABNORMAL LOW (ref 39.0–52.0)
Hemoglobin: 10.1 g/dL — ABNORMAL LOW (ref 13.0–17.0)

## 2021-12-27 LAB — DIRECT ANTIGLOBULIN TEST (NOT AT ARMC)
DAT, IgG: NEGATIVE
DAT, complement: NEGATIVE

## 2021-12-27 LAB — MAGNESIUM: Magnesium: 1.8 mg/dL (ref 1.7–2.4)

## 2021-12-27 LAB — PREPARE RBC (CROSSMATCH)

## 2021-12-27 LAB — VITAMIN B12: Vitamin B-12: 865 pg/mL (ref 180–914)

## 2021-12-27 LAB — FOLATE: Folate: 36.6 ng/mL (ref >5.9)

## 2021-12-27 MED ORDER — SODIUM CHLORIDE 0.9% IV SOLUTION: Freq: Once | INTRAVENOUS | Status: DC

## 2021-12-27 NOTE — Progress Notes (Signed)
FPTS Brief Progress Note ? ?S: Patient was resting when I went to evaluate.  Did not disturb.  Spoke with the nurse who reported he had had 1 bowel movement since she had started her shift at 7 PM.  She had no concerns or needs at this time. ? ? ?O: ?BP (!) 140/100 (BP Location: Right Arm)   Pulse 100   Temp 99 ?F (37.2 ?C) (Oral)   Resp 19   Ht '5\' 10"'$  (1.778 m)   Wt 102.1 kg   SpO2 100%   BMI 32.28 kg/m?   ? ? ?A/P: ?Continue current plan per day team, no changes necessary at this time ?- Orders reviewed. Labs for AM ordered, which was adjusted as needed.  ? ?[name], MD ?12/27/2021, 11:42 PM ?PGY-3, [place] Night Resident  ?Please page [phone] with questions.  ?  ?

## 2021-12-27 NOTE — Progress Notes (Signed)
Patient reported blood in BM this afternoon. Michela Pitcher it was between a bright and dark red. MD Dameron paged. ?

## 2021-12-27 NOTE — Progress Notes (Signed)
Teaching services made aware of patient's high BP's this afternoon. ?

## 2021-12-27 NOTE — Progress Notes (Signed)
OT Cancellation Note ? ?Patient Details ?Name: WILLOW RECZEK ?MRN: [id number] ?DOB: September 10, 1966 ? ? ?Cancelled Treatment:    Reason Eval/Treat Not Completed: Medical issues which prohibited therapy Pt with fall overnight and swollen ankle this AM. Per RN, pt pending foot xray to ensure no fx. Will hold OT attempts for OOB activities until xray completed/resulted.  ? ?Layla Maw ?12/27/2021, 8:49 AM ?

## 2021-12-27 NOTE — Progress Notes (Signed)
Inpatient Diabetes Program Recommendations ? ?AACE/ADA: New Consensus Statement on Inpatient Glycemic Control  ? ?Target Ranges:  Prepandial:   less than 140 mg/dL ?     Peak postprandial:   less than 180 mg/dL (1-2 hours) ?     Critically ill patients:  140 - 180 mg/dL  ? ? ?Review of Glycemic Control ? Latest Reference Range & Units 12/26/21 07:27 12/26/21 12:26 12/26/21 16:43 12/26/21 20:47 12/27/21 07:29  ?Glucose-Capillary 70 - 99 mg/dL 105 (H) 241 (H) 275 (H) 318 (H) 165 (H)  ? ?Diabetes history: DM2 ?Outpatient Diabetes medications: Lantus 5 units QPM, Metformin 428 mg BID, Trulicity 7.68 mg Qweek (Wednesday) ?Current orders for Inpatient glycemic control: Semglee 5 units QHS, Novolog 0-6 units TID with meals ?  ?Inpatient Diabetes Program Recommendations:   ?  ?Insulin: Please consider ordering Novolog 3 units TID with meals for meal coverage if patient eats at least 50% of meals. ?  ?Diet: Please consider discontinuing Regular diet and ordering Carb Modified diet. ?  ?Thanks, ?Tama Headings RN, MSN, BC-ADM ?Inpatient Diabetes Coordinator ?Team Pager [phone] (8a-5p) ? ? ?

## 2021-12-28 DIAGNOSIS — R739 Hyperglycemia, unspecified: Secondary | ICD-10-CM | POA: Diagnosis not present

## 2021-12-28 DIAGNOSIS — N179 Acute kidney failure, unspecified: Secondary | ICD-10-CM | POA: Diagnosis not present

## 2021-12-28 DIAGNOSIS — D649 Anemia, unspecified: Secondary | ICD-10-CM | POA: Diagnosis not present

## 2021-12-28 DIAGNOSIS — R55 Syncope and collapse: Secondary | ICD-10-CM | POA: Diagnosis not present

## 2021-12-28 LAB — MAGNESIUM: Magnesium: 1.7 mg/dL (ref 1.7–2.4)

## 2021-12-28 LAB — IRON AND TIBC
Iron: 26 ug/dL — ABNORMAL LOW (ref 45–182)
Saturation Ratios: 23 % (ref 17.9–39.5)
TIBC: 113 ug/dL — ABNORMAL LOW (ref 250–450)
UIBC: 87 ug/dL

## 2021-12-28 LAB — TYPE AND SCREEN
ABO/RH(D): O POS
Antibody Screen: NEGATIVE
Unit division: 0

## 2021-12-28 LAB — FERRITIN: Ferritin: 88 ng/mL (ref 24–336)

## 2021-12-28 LAB — BASIC METABOLIC PANEL
Anion gap: 3 — ABNORMAL LOW (ref 5–15)
BUN: 18 mg/dL (ref 6–20)
CO2: 27 mmol/L (ref 22–32)
Calcium: 7.3 mg/dL — ABNORMAL LOW (ref 8.9–10.3)
Chloride: 106 mmol/L (ref 98–111)
Creatinine, Ser: 1.3 mg/dL — ABNORMAL HIGH (ref 0.61–1.24)
GFR, Estimated: >60 mL/min (ref >60)
Glucose, Bld: 130 mg/dL — ABNORMAL HIGH (ref 70–99)
Potassium: 4.9 mmol/L (ref 3.5–5.1)
Sodium: 136 mmol/L (ref 135–145)

## 2021-12-28 LAB — BPAM RBC
Blood Product Expiration Date: 202304152359
ISSUE DATE / TIME: 202303231419
Unit Type and Rh: 5100

## 2021-12-28 LAB — CBC
HCT: 25.6 % — ABNORMAL LOW (ref 39.0–52.0)
Hemoglobin: 8.7 g/dL — ABNORMAL LOW (ref 13.0–17.0)
MCH: 26.2 pg (ref 26.0–34.0)
MCHC: 34 g/dL (ref 30.0–36.0)
MCV: 77.1 fL — ABNORMAL LOW (ref 80.0–100.0)
Platelets: 197 10*3/uL (ref 150–400)
RBC: 3.32 MIL/uL — ABNORMAL LOW (ref 4.22–5.81)
RDW: 26.5 % — ABNORMAL HIGH (ref 11.5–15.5)
WBC: 9.8 10*3/uL (ref 4.0–10.5)
nRBC: 0.2 % (ref 0.0–0.2)

## 2021-12-28 LAB — GLUCOSE, CAPILLARY
Glucose-Capillary: 133 mg/dL — ABNORMAL HIGH (ref 70–99)
Glucose-Capillary: 193 mg/dL — ABNORMAL HIGH (ref 70–99)
Glucose-Capillary: 210 mg/dL — ABNORMAL HIGH (ref 70–99)
Glucose-Capillary: 384 mg/dL — ABNORMAL HIGH (ref 70–99)

## 2021-12-28 LAB — PHOSPHORUS: Phosphorus: 2.2 mg/dL — ABNORMAL LOW (ref 2.5–4.6)

## 2021-12-28 LAB — ACTH: C206 ACTH: 14.3 pg/mL (ref 7.2–63.3)

## 2021-12-28 MED ORDER — ENSURE MAX PROTEIN PO LIQD
11.0000 [oz_av] | Freq: Two times a day (BID) | ORAL | Status: DC
  Administered 2021-12-28 – 2021-12-31 (×5): 11 [oz_av] via ORAL
  Filled 2021-12-28 (×8): qty 330

## 2021-12-28 MED ORDER — K PHOS MONO-SOD PHOS DI & MONO 155-852-130 MG PO TABS
500.0000 mg | ORAL_TABLET | Freq: Once | ORAL | Status: AC
  Administered 2021-12-28: 500 mg via ORAL
  Filled 2021-12-28: qty 2

## 2021-12-28 MED ORDER — ASPIRIN EC 81 MG PO TBEC: 81.0000 mg | DELAYED_RELEASE_TABLET | Freq: Every day | ORAL | Status: DC

## 2021-12-28 NOTE — Progress Notes (Signed)
Physical Therapy Treatment ?Patient Details ?Name: Jeffrey [name] ?MRN: [id number] ?DOB: [birth year] ?Today's Date: 12/28/2021 ? ? ?History of Present Illness 56 y.o. male presenting with dizziness and falls for past few weeks. PMH is significant for HTN, HLD, CKD, MI, cardiac arrest, anoxic brain injury, T2DM, GERD, cirrhosis, Depression, anemia of chronic disease ? ?  ?PT Comments  ? ? Patient progressing towards physical therapy goals. Patient with improved activity tolerance with BP monitoring throughout mobility. RN donned TED hose prior to mobilizing. Patient ambulating with rollator and continues to improve with safe brake management prior to sitting on rollator or in other chair. Patient with no syncopal episode or symptoms during mobility and BP remained stable, see below. D/c plan remains appropriate.  ? ?Seated in recliner: 123/86, 84 HR  ?Initial standing: 120/81, 90 HR ?Standing > 3 min:114/97, 95 HR ?During bathroom task: 150/98, 82 HR  ?Hallway mobility: 132/89, 87 HR  ?After return to recliner: 147/103, 83 HR ? ?   ?Recommendations for follow up therapy are one component of a multi-disciplinary discharge planning process, led by the attending physician.  Recommendations may be updated based on patient status, additional functional criteria and insurance authorization. ? ?Follow Up Recommendations ? Outpatient PT ?  ?  ?Assistance Recommended at Discharge Intermittent Supervision/Assistance  ?Patient can return home with the following A lot of help with walking and/or transfers ?  ?Equipment Recommendations ? Rollator (4 wheels)  ?  ?Recommendations for Other Services   ? ? ?  ?Precautions / Restrictions Precautions ?Precautions: Fall;Other (comment) ?Precaution Comments: monitor orthostatics; TED hose ?Restrictions ?Weight Bearing Restrictions: No  ?  ? ?Mobility ? Bed Mobility ?  ?  ?  ?  ?  ?  ?  ?General bed mobility comments: up in chair ?  ? ?Transfers ?Overall transfer level: Needs  assistance ?Equipment used: Rollator (4 wheels) ?Transfers: Sit to/from Stand ?Sit to Stand: Min guard ?  ?  ?  ?  ?  ?General transfer comment: after initial cues to lock rollator brakes, pt able to implement when standing from toilet, locked rollator, during session. no physical assist to power up ?  ? ?Ambulation/Gait ?Ambulation/Gait assistance: Min guard, +2 safety/equipment ?Gait Distance (Feet): 60 Feet ?Assistive device: Rollator (4 wheels) ?Gait Pattern/deviations: Step-through pattern, Decreased stride length ?Gait velocity: decreased ?  ?  ?General Gait Details: min guard for safety due to hx of sudden syncopal episodes during mobility. No complaints of symptoms of orthostasis or syncope. ? ? ?Stairs ?  ?  ?  ?  ?  ? ? ?Wheelchair Mobility ?  ? ?Modified Rankin (Stroke Patients Only) ?  ? ? ?  ?Balance Overall balance assessment: Needs assistance, History of Falls ?Sitting-balance support: No upper extremity supported, Feet supported ?Sitting balance-Leahy Scale: Good ?  ?  ?Standing balance support: Bilateral upper extremity supported, During functional activity ?Standing balance-Leahy Scale: Fair ?  ?  ?  ?  ?  ?  ?  ?  ?  ?  ?  ?  ?  ? ?  ?Cognition Arousal/Alertness: Awake/alert ?Behavior During Therapy: Jeffrey [name] for tasks assessed/performed ?Overall Cognitive Status: History of cognitive impairments - at baseline ?  ?  ?  ?  ?  ?  ?  ?  ?  ?  ?  ?  ?  ?  ?  ?  ?General Comments: hx of anoxic brain injury; pleasant and appropriate throughout session; shows insight into deficits and safety concerns though slower processing; requires redirections  due to talkative ?  ?  ? ?  ?Exercises General Exercises - Lower Extremity ?Long Arc Quad: Both, 10 reps, Seated ?Hip Flexion/Marching: Both, 10 reps, Standing ?Mini-Sqauts: 5 reps, Standing ? ?  ?General Comments General comments (skin integrity, edema, etc.): RN present to assist in donning TED hose prior to mobility  RN present to assist in donning TED hose prior  to mobility ?  ?  ? ?Pertinent Vitals/Pain Pain Assessment ?Pain Assessment: No/denies pain  ? ? ?Home Living   ?  ?  ?  ?  ?  ?  ?  ?  ?  ?   ?  ?Prior Function    ?  ?  ?   ? ?PT Goals (current goals can now be found in the care plan section) Acute Rehab PT Goals ?PT Goal Formulation: With patient ?Time For Goal Achievement: 01/04/22 ?Potential to Achieve Goals: Good ?Progress towards PT goals: Progressing toward goals ? ?  ?Frequency ? ? ? Min 3X/week ? ? ? ?  ?PT Plan Current plan remains appropriate  ? ? ?Co-evaluation PT/OT/SLP Co-Evaluation/Treatment: Yes ?Reason for Co-Treatment: For patient/therapist safety;Other (comment) (sudden syncopal episodes) ?  ?OT goals addressed during session: ADL's and self-care ?  ? ?  ?AM-PAC PT "6 Clicks" Mobility   ?Outcome Measure ? Help needed turning from your back to your side while in a flat bed without using bedrails?: None ?Help needed moving from lying on your back to sitting on the side of a flat bed without using bedrails?: None ?Help needed moving to and from a bed to a chair (including a wheelchair)?: A Little ?Help needed standing up from a chair using your arms (e.g., wheelchair or bedside chair)?: A Little ?Help needed to walk in hospital room?: A Little ?Help needed climbing 3-5 steps with a railing? : Total ?6 Click Score: 18 ? ?  ?End of Session Equipment Utilized During Treatment: Gait belt ?Activity Tolerance: Patient tolerated treatment well ?Patient left: in chair;with call bell/phone within reach;with chair alarm set ?Nurse Communication: Mobility status ?PT Visit Diagnosis: Unsteadiness on feet (R26.81);Muscle weakness (generalized) (M62.81);History of falling (Z91.81);Other abnormalities of gait and mobility (R26.89) ?  ? ? ?Time: 1020-1101 ?PT Time Calculation (min) (ACUTE ONLY): 41 min ? ?Charges:  $Gait Training: 8-22 mins          ?          ? ?[name] A. Gilford Rile, PT, DPT ?Acute Rehabilitation Services ?Pager [phone] ?Office  [phone] ? ? ? ?[name] A Jaqwan Wieber ?12/28/2021, 1:05 PM ? ?

## 2021-12-28 NOTE — Progress Notes (Signed)
Occupational Therapy Treatment ?Patient Details ?Name: Jeffrey [name] ?MRN: [id number] ?DOB: Dec 15, 1965 ?Today's Date: 12/28/2021 ? ? ?History of present illness 56 y.o. male presenting with dizziness and falls for past few weeks. PMH is significant for HTN, HLD, CKD, MI, cardiac arrest, anoxic brain injury, T2DM, GERD, cirrhosis, Depression, anemia of chronic disease ?  ?OT comments ? Session focused on further assessment of ability to complete ADLs/mobility with implementation of safety strategies if syncopal event occurs. Also focused on progression of activity tolerance with BP monitoring throughout. RN donned TED hose prior to mobilizing. Pt able to mobilize using Rollator with min guard and good carryover of brake mgmt to/from bathroom and out into hallway. Pt without syncopal episode and without dramatic drop in BP during session. Pt endorses feeling better and more comfortable with idea for DC home.  ? ?Seated in recliner: 123/86, 84 HR  ?Initial standing: 120/81, 90 HR ?Standing > 3 min:114/97, 95 HR ?During bathroom task: 150/98, 82 HR  ?Hallway mobility: 132/89, 87 HR  ?After return to recliner: 147/103, 83 HR  ? ?Recommendations for follow up therapy are one component of a multi-disciplinary discharge planning process, led by the attending physician.  Recommendations may be updated based on patient status, additional functional criteria and insurance authorization. ?   ?Follow Up Recommendations ? No OT follow up  ?  ?Assistance Recommended at Discharge Intermittent Supervision/Assistance  ?Patient can return home with the following ? Assistance with cooking/housework;Assist for transportation;Help with stairs or ramp for entrance ?  ?Equipment Recommendations ? None recommended by OT  ?  ?Recommendations for Other Services   ? ?  ?Precautions / Restrictions Precautions ?Precautions: Fall;Other (comment) ?Precaution Comments: monitor orthostatics; TED hose ?Restrictions ?Weight Bearing Restrictions: No   ? ? ?  ? ?Mobility Bed Mobility ?  ?  ?  ?  ?  ?  ?  ?General bed mobility comments: up in chair ?  ? ?Transfers ?Overall transfer level: Needs assistance ?Equipment used: Rollator (4 wheels) ?Transfers: Sit to/from Stand ?Sit to Stand: Min guard ?  ?  ?  ?  ?  ?General transfer comment: after initial cues to lock rollator brakes, pt able to implement when standing from toilet, locked rollator, during session. no physical assist to power up ?  ?  ?Balance Overall balance assessment: Needs assistance, History of Falls ?Sitting-balance support: No upper extremity supported, Feet supported ?Sitting balance-Leahy Scale: Good ?  ?  ?Standing balance support: Bilateral upper extremity supported, During functional activity ?Standing balance-Leahy Scale: Fair ?  ?  ?  ?  ?  ?  ?  ?  ?  ?  ?  ?  ?   ? ?ADL either performed or assessed with clinical judgement  ? ?ADL Overall ADL's : Needs assistance/impaired ?  ?  ?  ?  ?  ?  ?  ?  ?  ?  ?  ?  ?Toilet Transfer: Min guard;Ambulation;Rollator (4 wheels);BSC/3in1;Regular Toilet ?Toilet Transfer Details (indicate cue type and reason): good carryover of locking brakes with transfers; BSC over toilet ?Toileting- Water quality scientist and Hygiene: Min guard;Sitting/lateral lean;Sit to/from stand ?Toileting - Clothing Manipulation Details (indicate cue type and reason): for clothing mgmt in standing. pt able to perform hygiene seated on toilet without assist ?  ?  ?  ?General ADL Comments: Improving carryover of Rollator brake mgmt though slow to lock, turn and sit if needing to sit quickly for syncopal symptoms ?  ? ?Extremity/Trunk Assessment Upper Extremity Assessment ?Upper Extremity  Assessment: Overall WFL for tasks assessed ?  ?Lower Extremity Assessment ?Lower Extremity Assessment: Defer to PT evaluation ?  ?  ?  ? ?Vision   ?Vision Assessment?: No apparent visual deficits ?  ?Perception   ?  ?Praxis   ?  ? ?Cognition Arousal/Alertness: Awake/alert ?Behavior During Therapy:  [facility] for tasks assessed/performed ?Overall Cognitive Status: History of cognitive impairments - at baseline ?  ?  ?  ?  ?  ?  ?  ?  ?  ?  ?  ?  ?  ?  ?  ?  ?General Comments: hx of anoxic brain injury; pleasant and appropriate throughout session; shows insight into deficits and safety concerns though slower processing; requires redirections due to talkative ?  ?  ?   ?Exercises   ? ?  ?Shoulder Instructions   ? ? ?  ?General Comments RN present to assist in donning TED hose prior to mobility  ? ? ?Pertinent Vitals/ Pain       Pain Assessment ?Pain Assessment: No/denies pain ? ?Home Living   ?  ?  ?  ?  ?  ?  ?  ?  ?  ?  ?  ?  ?  ?  ?  ?  ?  ?  ? ?  ?Prior Functioning/Environment    ?  ?  ?  ?   ? ?Frequency ? Min 2X/week  ? ? ? ? ?  ?Progress Toward Goals ? ?OT Goals(current goals can now be found in the care plan section) ? Progress towards OT goals: Progressing toward goals ? ?Acute Rehab OT Goals ?Patient Stated Goal: be able to go home safely; reduce syncopal events ?OT Goal Formulation: With patient ?Time For Goal Achievement: 01/04/22 ?Potential to Achieve Goals: Good ?ADL Goals ?Pt Will Transfer to Toilet: with modified independence;ambulating ?Pt Will Perform Tub/Shower Transfer: Tub transfer;with set-up;ambulating ?Additional ADL Goal #1: Pt to increase standing activity tolerance > 7 min implementing appropriate syncopal episode precautions prn ?Additional ADL Goal #2: Pt to gather ADL/IADL items MOD I  ?Plan Discharge plan remains appropriate   ? ?Co-evaluation ? ? ? PT/OT/SLP Co-Evaluation/Treatment: Yes ?Reason for Co-Treatment: To address functional/ADL transfers;For patient/therapist safety;Other (comment) (sudden syncopal episodes) ?  ?OT goals addressed during session: ADL's and self-care ?  ? ?  ?AM-PAC OT "6 Clicks" Daily Activity     ?Outcome Measure ? ? Help from another person eating meals?: None ?Help from another person taking care of personal grooming?: A Little ?Help from another person  toileting, which includes using toliet, bedpan, or urinal?: A Little ?Help from another person bathing (including washing, rinsing, drying)?: A Little ?Help from another person to put on and taking off regular upper body clothing?: A Little ?Help from another person to put on and taking off regular lower body clothing?: A Little ?6 Click Score: 19 ? ?  ?End of Session Equipment Utilized During Treatment: Gait belt;Rollator (4 wheels) ? ?OT Visit Diagnosis: Unsteadiness on feet (R26.81) ?  ?Activity Tolerance Patient tolerated treatment well ?  ?Patient Left in chair;with call bell/phone within reach;with chair alarm set ?  ?Nurse Communication Mobility status ?  ? ?   ? ?Time: 1022-1101 ?OT Time Calculation (min): 39 min ? ?Charges: OT General Charges ?$OT Visit: 1 Visit ?OT Treatments ?$Self Care/Home Management : 8-22 mins ?$Therapeutic Activity: 8-22 mins ? ?Malachy Chamber, OTR/L ?Acute Rehab Services ?Office: [phone]  ? ?Layla Maw ?12/28/2021, 11:17 AM ?

## 2021-12-28 NOTE — Progress Notes (Addendum)
FPTS Brief Progress Note ? ?S: Patient seen and assessed at bedside.  Night nurse at bedside as well.  Patient reports no acute concerns at this time.  Patient reports diarrhea has been less in frequency tonight.  Nurse reports initial concern that patient's blood pressure had been elevated to SBP in the 190s; however, manual check showed was in the 160s.  ? ? ?O: ?BP (!) 196/120   Pulse 80   Temp 98.7 ?F (37.1 ?C) (Oral)   Resp 18   Ht '5\' 10"'$  (1.778 m)   Wt 102.1 kg   SpO2 100%   BMI 32.28 kg/m?   ? ? ?A/P: ?- Unless symptomatic, please notify MD if SBP>210 on manual check ?- Plan per day team ?- Orders reviewed. Labs for AM ordered, which was adjusted as needed.  ?- If condition changes, plan includes bedside eval.  ? ?France Ravens, MD ?12/28/2021, 11:39 PM ?PGY-1, [place] [facility] Night Resident  ?Please page [phone] with questions.  ? ?

## 2021-12-28 NOTE — Progress Notes (Addendum)
Family Medicine Teaching Service ?Daily Progress Note ?Intern Pager: [phone] ? ?Patient name: Jeffrey [name] Medical record number: [id number] ?Date of birth: 1966/05/23 Age: 56 y.o. Gender: male ? ?Primary Care Provider: [name], MD ?Consultants: Gastroenterology, Hematology ?Code Status: Full ? ?Pt Overview and Major Events to Date:  ?12/20/21: Admitted ? ?LOS 8 days ? ?Assessment and Plan: ?Jeffrey [name] is a 56 year-old male admitted for generalized weakness and falls, with persistent anemia and orthostatic hypotension. PMH significant for hypertension, hyperlipidemia, CKD, MI s/p cardiac arrest, anoxic brain injury, T2DM, GERD, ETOH liver cirrhosis, depression, anemia of chronic disease. ?  ?Acute on chronic normocytic anemia, likely 2/2 liver cirrhosis ?Blood smear with target cells and schistocytes- nonspecific findings, could be reflective of his liver disease. ?LDH elevated d/t liver cirrhosis (per hematology). DAT negative. ?Persistent drops in Hgb despite multiple transfusions, Hgb 8.7 this am. No signs of bleeding. ?Hematology consulted- will consider EPO stim agent o/p and they do not feel bone marrow biopsy is necessary d/t lack of pancytopenia and stability with platelets and WBC counts. ?Given continued decline in Hgb and ferritin, may need iron. Will monitor at least until tomorrow AM to ensure no further significant decline/sign of bleed. ?- F/u Hgb in AM CBC ?- Will d/w hematology if IV iron necessary ?- Hold ASA $Remove'81mg'EKyxiFR$  ?- F/u SPEP ?- PT/OT ? ?Gross hematuria, chronic ?1x episode this morning bright red blood in urine.  ?Prior U/As with large Hgb dipstick and RBC, most recently >50.  ?No urinary sxs. No sign of infection, renal stone. ?- O/p f/u with urology at discharge ? ?Orthostatic hypotension ?No orthostatic hypotension with PT today. This is an improvement from past sessions which he had orthostatic hypotension with standing/ambulation. ?- Monitor VS ? ?Hypocalcemia ?Noted on prior  labs- PTH - 86 12/06/21;  46 on 12/20/21, Ionized serum 4.7 on 3/17 . Will repeat PTH and ionized ca2+ today as blood transfusions likely interfered with measurement. ?- F/u PTH and ionized Ca2+ ? ?Clostridium dificile ?Improving. Only 2 BM overnight.  ?- Continue Fidaxomicin, day 3/10  ?- Serial abdominal exams ?- I/Os ? ?FEN/GI: Regular diet ?PPx: SCD,  high bleeding risk ?Dispo: Pending discussion with pt on SNF vs home with [facility] PT/OT  pending clinical improvement .  ? ?Subjective:  ?Feeling better this AM. Much less diarrhea, had 2 stools overnight. Denies dizziness, chest pain. Right foot still sore from fall. Eating well. ? ?Objective: ?Temp:  [97.8 ?F (36.6 ?C)-99 ?F (37.2 ?C)] 98.6 ?F (37 ?C) (03/24 0500) ?Pulse Rate:  [80-100] 80 (03/24 0500) ?Resp:  [16-19] 18 (03/24 0500) ?BP: (129-185)/(76-114) 129/76 (03/24 0500) ?SpO2:  [99 %-100 %] 99 % (03/24 0500) ?Physical Exam: ?General: Sitting upright in bed, eating breakfast ?Cardiovascular: RRR ?Respiratory: Normal work of breathing on room air. No wheezing or crackles ?Abdomen: Soft, obese, non-tender, non-distended ?Extremities: Warm, dry. Chronic venous stasis changes ? ?Laboratory: ?Recent Labs  ?Lab 12/26/21 ?0428 12/27/21 ?0729 12/27/21 ?1804 12/28/21 ?2620  ?WBC 8.3 9.5  --  9.8  ?HGB 8.3* 7.9* 10.1* 8.7*  ?HCT 25.7* 24.3* 30.5* 25.6*  ?PLT 218 192  --  197  ? ?Recent Labs  ?Lab 12/26/21 ?0428 12/27/21 ?0729 12/28/21 ?3559  ?NA 137 134* 136  ?K 4.4 4.4 4.9  ?CL 101 101 106  ?CO2 $Rem'28 28 27  'INiD$ ?BUN $Re'16 16 18  'oZm$ ?CREATININE 1.25* 1.32* 1.30*  ?CALCIUM 7.0* 7.1* 7.3*  ?GLUCOSE 172* 190* 130*  ? ? ?Imaging/Diagnostic Tests: ?DG Foot Complete Right ? ?Result Date:  12/27/2021 ?CLINICAL DATA:  Fall yesterday. Two nonhealing ulcers on first and second toes (on superior portion of toes). EXAM: RIGHT FOOT COMPLETE - 3+ VIEW COMPARISON:  None. FINDINGS: There is mild joint space narrowing of the interphalangeal joints diffusely. Mild dorsal talonavicular degenerative  osteophytosis. Moderate anterior and posterior distal tibial plafond degenerative osteophytes. Moderate to high-grade dorsal greater than plantar midfoot and forefoot soft tissue swelling. No definite cortical erosion is seen. No acute fracture or dislocation. IMPRESSION:: IMPRESSION: 1. 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 2. No definite cortical erosion is seen to indicate acute osteomyelitis. 3. Moderate to high-grade dorsal greater than plantar midfoot and forefoot soft tissue swelling. Electronically Signed   By: Yvonne Kendall M.D.   On: 12/27/2021 09:35   ? ? ?Orvis Brill, DO ?12/28/2021, 8:59 AM ?PGY-1, [place] ?[place] Intern pager: [phone], text pages welcome ? ?

## 2021-12-28 NOTE — Progress Notes (Signed)
Hematology ?Labs reviewed ? ?  Latest Ref Rng & Units 12/28/2021  ?  6:37 AM 12/27/2021  ?  6:04 PM 12/27/2021  ?  7:29 AM  ?CBC  ?WBC 4.0 - 10.5 K/uL 9.8    9.5    ?Hemoglobin 13.0 - 17.0 g/dL 8.7   10.1   7.9    ?Hematocrit 39.0 - 52.0 % 25.6   30.5   24.3    ?Platelets 150 - 400 K/uL 197    192    ? ?Status post blood transfusion ?Reticulocytes 1% ?SPEP: Pending ?LDH: 465, DAT negative (LDH is elevated because of liver cirrhosis) ?W40: 973, folic acid 53.2: Normal ?12/20/2020: Ferritin 46 ?10/02/2021: Lowest hemoglobin 6.3 (MCV 87) ?Patient has a history of chronic normocytic anemia her hemoglobin is usually between 9 to 10 g. ?Most likely related to cirrhosis of the liver. ?Work-up with EGD and colonoscopy were unremarkable. ? ?Diagnosis: Anemia of acute illness on top of chronic disease ?Continue supportive care with blood transfusions as needed ?We will consider outpatient erythropoietin stimulating agent therapy. ?Since the other blood parameters especially the platelets and white blood cell count are normal we do not intend to do any bone marrow biopsy at this time. ?Awaiting the results of SPEP. ?

## 2021-12-28 NOTE — Progress Notes (Signed)
Patient urine was red. MD was notified. ?

## 2021-12-28 NOTE — Progress Notes (Addendum)
Nutrition Follow-up ? ?DOCUMENTATION CODES:  ?Not applicable ? ?INTERVENTION:  ?-obtain weekly weights ?-d/c ensure enlive ?-Ensure Max po BID, each supplement provides 150 kcal and 30 grams of protein ?-continue MVI with minerals daily ? ?NUTRITION DIAGNOSIS:  ?Inadequate oral intake related to poor appetite as evidenced by per patient/family report. -- ongoing ? ?GOAL:  ?Patient will meet greater than or equal to 90% of their needs -- progressing ? ?MONITOR:  ?PO intake, Supplement acceptance, Labs ? ?REASON FOR ASSESSMENT:  ?Malnutrition Screening Tool, Consult ?Assessment of nutrition requirement/status ? ?ASSESSMENT:  ?56 yo male admitted with hypoglycemia, weakness, frequent falls. PMH includes DM-2, HTN, cardiac arrest, HLD, GERD, alcohol abuse. ? ?3/18 - s/p EGD (unremarkable) ?3/19 - s/p colonoscopy revealed diverticulosis, otherwise unremarkable ? ?Oncology following. Pt noted to have anemia of acute illness on top of chronic disease. Plan of care per MD is for continued supportive care w/ PRN blood transfusions. Considering outpt EPO stimulating agent therapy. SPEP pending but no plan for bone marrow bx at this time. Per RN, pt urine was red this AM; MD notified. Pt being treated for C.diff.  ? ?Pt with excellent PO intake since last RD visit. 100% meal completion and consistent acceptance of ONS. Will transition pt from ensure enlive to ensure max given good po intake and elevated CBGs.   ? ?UOP: 58m x24 hours ?I/O: +[id number] admit ? ?Pt has not updated weight in >7 days. Will order new measured weight; recommend update weekly. Note pt with non-pitting edema to BUE and moderate pitting edema to BLE per RN assessment.  ? ?Medications: ? feeding supplement  237 mL Oral TID BM  ? fidaxomicin  200 mg Oral BID  ? folic acid  1 mg Oral QHS  ? insulin aspart  0-6 Units Subcutaneous TID WC  ? insulin glargine-yfgn  5 Units Subcutaneous QHS  ? multivitamin with minerals  1 tablet Oral Daily  ? pantoprazole   40 mg Oral QHS  ? thiamine  100 mg Oral QHS  ?Labs: ?Recent Labs  ?Lab 12/25/21 ?0642703/22/23 ?0428 12/27/21 ?0762373/24/23 ?[id number]?NA 134* 137 134* 136  ?K 4.6 4.4 4.4 4.9  ?CL 100 101 101 106  ?CO2 '27 28 28 27  '$ ?BUN '16 16 16 18  '$ ?CREATININE 1.35* 1.25* 1.32* 1.30*  ?CALCIUM 7.0* 7.0* 7.1* 7.3*  ?MG 1.2* 1.2* 1.8 1.7  ?PHOS 2.2* 2.4*  --  2.2*  ?GLUCOSE 189* 172* 190* 130*  ?LDH 465 ?Ferritin WNL ?Folate WNL ?B12 WNL ?Hgb 8.7 (trending down) ?CBGs: 133-245 x24 hours (diabetes coordinator following) ?  ?Diet Order:   ?Diet Order   ? ?       ?  Diet regular Room service appropriate? Yes; Fluid consistency: Thin  Diet effective now       ?  ? ?  ?  ? ?  ? ?EDUCATION NEEDS:  ?Not appropriate for education at this time ? ?Skin:  Skin Assessment: Skin Integrity Issues: ?Skin Integrity Issues:: Diabetic Ulcer ?Diabetic Ulcer: great and 2nd toes on bilateral feet ? ?Last BM:  3/23 ? ?Height:  ?Ht Readings from Last 1 Encounters:  ?12/20/21 '5\' 10"'$  (1.778 m)  ? ?Weight:  ?Wt Readings from Last 1 Encounters:  ?12/20/21 102.1 kg  ? ?BMI:  Body mass index is 32.28 kg/m?. ? ?Estimated Nutritional Needs:  ?Kcal:  2000-2200 ?Protein:  100-120 gm ?Fluid:  2-2.2 L ? ? ?AmTheone Stanley MS, RD, LDN (she/her/hers) ?RD pager number and weekend/on-call pager number located  in [place]. ?

## 2021-12-28 NOTE — TOC Progression Note (Signed)
Transition of Care (TOC) - Progression Note  ? ? ?Patient Details  ?Name: Jeffrey [name] ?MRN: [id number] ?Date of Birth: [birth year] ? ?Transition of Care (TOC) CM/SW Contact  ?Tom-Johnson, Renea Ee, RN ?Phone Number: ?12/28/2021, 3:27 PM ? ?Clinical Narrative:    ? ?Patient continues to be positive for Orthostatic Hypotension. C-diff improving.  Continues Fidaxomicin, on day 3 out of 10. 1 unit PRBC given this admission. CM will continue to follow with needs.   ? ?Expected Discharge Plan: Home/Self Care ?Barriers to Discharge: Continued Medical Work up ? ?Expected Discharge Plan and Services ?Expected Discharge Plan: Home/Self Care ?  ?Discharge Planning Services: CM Consult ?Post Acute Care Choice: NA ?Living arrangements for the past 2 months: [place] ?                ?DME Arranged: N/A ?DME Agency: NA ?  ?  ?  ?  ?[place] Agency: NA ?  ?  ?  ? ? ?Social Determinants of Health (SDOH) Interventions ?  ? ?Readmission Risk Interventions ? ?  12/21/2021  ?  3:10 PM 09/17/2021  ? 12:59 PM 08/01/2021  ?  1:24 PM  ?Readmission Risk Prevention Plan  ?Transportation Screening Complete Complete Complete  ?PCP or Specialist Appt within 5-7 Days   Complete  ?PCP or Specialist Appt within 3-5 Days Complete Complete   ?Home Care Screening   Patient refused  ?Medication Review (RN CM)   Complete  ?[place] or Home Care Consult Complete Complete   ?Social Work Consult for [place] Planning/Counseling Complete Complete   ?Palliative Care Screening Not Applicable Not Applicable   ?Medication Review Press photographer) Complete Complete   ? ? ?

## 2021-12-29 DIAGNOSIS — D649 Anemia, unspecified: Secondary | ICD-10-CM | POA: Diagnosis not present

## 2021-12-29 DIAGNOSIS — R55 Syncope and collapse: Secondary | ICD-10-CM | POA: Diagnosis not present

## 2021-12-29 DIAGNOSIS — E119 Type 2 diabetes mellitus without complications: Secondary | ICD-10-CM | POA: Diagnosis not present

## 2021-12-29 LAB — HEMOGLOBIN AND HEMATOCRIT, BLOOD
HCT: 29.6 % — ABNORMAL LOW (ref 39.0–52.0)
Hemoglobin: 9.7 g/dL — ABNORMAL LOW (ref 13.0–17.0)

## 2021-12-29 LAB — GLUCOSE, CAPILLARY
Glucose-Capillary: 142 mg/dL — ABNORMAL HIGH (ref 70–99)
Glucose-Capillary: 153 mg/dL — ABNORMAL HIGH (ref 70–99)
Glucose-Capillary: 312 mg/dL — ABNORMAL HIGH (ref 70–99)
Glucose-Capillary: 122 mg/dL — ABNORMAL HIGH (ref 70–99)

## 2021-12-29 LAB — PARATHYROID HORMONE, INTACT (NO CA): PTH: 133 pg/mL — ABNORMAL HIGH (ref 15–65)

## 2021-12-29 LAB — HAPTOGLOBIN: Haptoglobin: <10 mg/dL — ABNORMAL LOW (ref 29–370)

## 2021-12-29 LAB — PHOSPHORUS: Phosphorus: 2.7 mg/dL (ref 2.5–4.6)

## 2021-12-29 MED ORDER — FERROUS SULFATE 325 (65 FE) MG PO TABS
325.0000 mg | ORAL_TABLET | ORAL | Status: DC
  Administered 2021-12-29 – 2021-12-31 (×2): 325 mg via ORAL
  Filled 2021-12-29 (×3): qty 1

## 2021-12-29 NOTE — Progress Notes (Addendum)
Family Medicine Teaching Service ?Daily Progress Note ?Intern Pager: [phone] ? ?Patient name: Jeffrey [name] Medical record number: [id number] ?Date of birth: May 21, 1966 Age: 56 y.o. Gender: male ? ?Primary Care Provider: [name], MD ?Consultants: GI, cardiology, hematology/oncology ?  Code Status: Full Code ? ?Pt Overview and Major Events to Date:  ?3/16-admitted ?3/18-transfused 1 unit pRBC, EGD performed without any abnormal findings ?3/19-colonoscopy revealing diverticulosis, otherwise unremarkable ?3/20-transfused 1 unit pRBC ?3/22-profuse diarrhea, started fidaxomicin for presumed C. difficile colitis ?3/23-transfused 1 unit pRBC ? ? ?Assessment and Plan: ?Jeffrey [name] is a 56 y.o. male who presents with generalized weakness and falls in the setting of acute on chronic anemia and orthostatic hypotension. PMHx significant for: Alcoholic liver cirrhosis, HTN, MI s/p cardiac arrest and anoxic brain injury, T2DM, HLD, GERD, anemia of chronic disease, MGUS, depression. ? ?Acute on chronic normocytic anemia ?Felt to be secondary to cirrhosis.  He has required multiple transfusions during this admission without obvious source of bleed.  Hemoglobin stable from yesterday.  Iron studies including ferritin and TSAT within normal range, will start oral iron supplementation per hematology/oncology recommendation. ?- hematology/oncology following, appreciate recommendations ?- start ferrous sulfate 325 mg qod ?- holding ASA ?- monitor Hgb ?- SPEP pending given history of MGUS ? ?Orthostatic hypotension ?Improving with discontinuation of antihypertensives and compression stockings.  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 ACTH level).  Next step in work-up would be to obtain MRI to assess for pituitary lesions, though he has had a normal MRI in 2017.  Believe this should be further work-up by  endocrinology in the outpatient setting. ?- outpatient endocrinology workup, possible repeat MRI ? ?Presumed C. difficile colitis ?Treating for presumed C. difficile infection given symptoms recent history of C. difficile infection within the past 6 months.  Improving with antibiotics.  Suspect will need to transition to p.o. vancomycin on discharge due to cost. ?- continue fidaxomicin day 4/10 ? ?T2DM ?Well-controlled. ?- Semglee 5 units qhs ?- vsSSI ?- monitor CBG ? ?Hypocalcemia ?This has been a persistent issue.  PTH appropriately elevated. ?- check vitamin D, low vitamin D levels can cause hypocalcemia ? ?Gross hematuria ?This has been an ongoing issue.  He did have one episode of this during this hospitalization. ?- outpatient urology workup ? ?FEN/GI: Regular diet ?PPx: SCDs given anemia ? ?Disposition: Likely home today versus tomorrow.  He will require transportation and a means to obtain his prescription medications to be discharged today.  Will discuss with social work. ? ?Subjective:  ?No overnight events.  Patient denies any abdominal pain.  Discussed with patient possibility of discharging today.  Patient does not feel he can go home today as his neighbor is not able to pick him up from the hospital today but his neighbor is read tomorrow.  He also states that his neighbor helps him out with errands such as getting groceries. ? ?Objective: ?Temp:  [97.9 ?F (36.6 ?C)-98.7 ?F (37.1 ?C)] 97.9 ?F (36.6 ?C) (03/25 0519) ?Pulse Rate:  [76-85] 76 (03/25 0519) ?Resp:  [18] 18 (03/25 0519) ?BP: (129-196)/(84-120) 129/84 (03/25 0519) ?SpO2:  [100 %] 100 % (03/25 0519) ?Weight:  [125.3 kg] 125.3 kg (03/25 0517) ?Physical Exam: ?General: Alert, lying in bed comfortably, NAD ?Cardiovascular: RRR, no murmurs ?Respiratory: Clear to auscultation bilaterally, breathing currently on room air ?Abdomen: Soft, nontender, active bowel sounds ?Extremities: Warm and well perfused, no edema ? ?Laboratory: ?Recent Labs  ?Lab  12/26/21 ?0428 12/27/21 ?  7262 12/27/21 ?1804 12/28/21 ?0355 12/29/21 ?0319  ?WBC 8.3 9.5  --  9.8  --   ?HGB 8.3* 7.9* 10.1* 8.7* 9.7*  ?HCT 25.7* 24.3* 30.5* 25.6* 29.6*  ?PLT 218 192  --  197  --   ? ?Recent Labs  ?Lab 12/26/21 ?0428 12/27/21 ?0729 12/28/21 ?9741  ?NA 137 134* 136  ?K 4.4 4.4 4.9  ?CL 101 101 106  ?CO2 '28 28 27  '$ ?BUN '16 16 18  '$ ?CREATININE 1.25* 1.32* 1.30*  ?CALCIUM 7.0* 7.1* 7.3*  ?GLUCOSE 172* 190* 130*  ? ? ? ? ?Imaging/Diagnostic Tests: ?No results found.  ? ?Zola Button, MD ?12/29/2021, 7:14 AM ?PGY-2, [place] ?[place] Intern pager: [phone], text pages welcome  ?

## 2021-12-30 DIAGNOSIS — D649 Anemia, unspecified: Secondary | ICD-10-CM | POA: Diagnosis not present

## 2021-12-30 DIAGNOSIS — E119 Type 2 diabetes mellitus without complications: Secondary | ICD-10-CM | POA: Diagnosis not present

## 2021-12-30 DIAGNOSIS — R531 Weakness: Secondary | ICD-10-CM | POA: Diagnosis not present

## 2021-12-30 LAB — CALCIUM, IONIZED: Calcium, Ionized, Serum: 4.6 mg/dL (ref 4.5–5.6)

## 2021-12-30 LAB — VITAMIN D 25 HYDROXY (VIT D DEFICIENCY, FRACTURES): Vit D, 25-Hydroxy: 14.82 ng/mL — ABNORMAL LOW (ref 30–100)

## 2021-12-30 LAB — HEMOGLOBIN AND HEMATOCRIT, BLOOD
HCT: 27.5 % — ABNORMAL LOW (ref 39.0–52.0)
Hemoglobin: 9 g/dL — ABNORMAL LOW (ref 13.0–17.0)

## 2021-12-30 LAB — GLUCOSE, CAPILLARY
Glucose-Capillary: 54 mg/dL — ABNORMAL LOW (ref 70–99)
Glucose-Capillary: 151 mg/dL — ABNORMAL HIGH (ref 70–99)
Glucose-Capillary: 189 mg/dL — ABNORMAL HIGH (ref 70–99)
Glucose-Capillary: 190 mg/dL — ABNORMAL HIGH (ref 70–99)
Glucose-Capillary: 181 mg/dL — ABNORMAL HIGH (ref 70–99)

## 2021-12-30 MED ORDER — GABAPENTIN 300 MG PO CAPS
600.0000 mg | ORAL_CAPSULE | Freq: Three times a day (TID) | ORAL | Status: DC
Start: 2021-12-30 — End: 2021-12-31
  Administered 2021-12-30 – 2021-12-31 (×4): 600 mg via ORAL
  Filled 2021-12-30 (×4): qty 2

## 2021-12-30 MED ORDER — CALCIUM CARBONATE 1250 (500 CA) MG PO TABS
1.0000 | ORAL_TABLET | Freq: Every day | ORAL | Status: DC
  Administered 2021-12-31: 500 mg via ORAL
  Filled 2021-12-30: qty 1

## 2021-12-30 MED ORDER — VITAMIN D 25 MCG (1000 UNIT) PO TABS
1000.0000 [IU] | ORAL_TABLET | Freq: Every day | ORAL | Status: DC
  Administered 2021-12-30 – 2021-12-31 (×2): 1000 [IU] via ORAL
  Filled 2021-12-30 (×2): qty 1

## 2021-12-30 NOTE — Progress Notes (Signed)
Family Medicine Teaching Service ?Daily Progress Note ?Intern Pager: [phone] ? ?Patient name: Jeffrey [name] Medical record number: [id number] ?Date of birth: Sep 09, 1966 Age: 56 y.o. Gender: male ? ?Primary Care Provider: [name], MD ?Consultants: GI, cardiology, hematology/oncology ?Code Status: Full ? ?Pt Overview and Major Events to Date:  ?3/16-admitted ?3/18-1 unit pRBCs transfused, EGD performed without any abnormal findings ?3/19-colonoscopy revealing diverticulosis, otherwise unremarkable ?3/20-1 unit pRBCs transfused ?3/22-profuse diarrhea, started fidaxomicin for presumed C. difficile colitis ?3/23-1 unit pRBCs transfused ? ?Assessment and Plan: ?Jeffrey [name] is a 56 year old male who presented with generalized weakness and falls in the setting of acute on chronic anemia and orthostatic hypotension.  PMH significant for alcoholic liver cirrhosis, HTN, MI s/p cardiac arrest and anoxic brain injury, T2DM, HLD, GERD, anemia of chronic disease, MGUS, depression ? ?Acute on chronic normocytic anemia ?Likely secondary to cirrhosis.  Has required 3 blood transfusions since admission with no obvious bleeding source.  Hemoglobin stable this morning at 9.  Iron studies been nonrevealing.  Patient is currently being supplemented with ferrous sulfate 325 mg every other day. ?-Hematology/oncology following, appreciate recommendations ?-Ferrous sulfate 325 mg every other day ?-Holding aspirin ?-Monitor hemoglobin ?-SPEP pending given history of MGUS ? ?Generalized weakness and falls  ?Pt continues have generalized weakness and seems unsteady on feet. PT recommends continued out patient PT and can return home with "a lot of help with walking and/or transfers". Unfortunately, pt will not have a lot of help with transfers at all times and would benefit from SNF level care for short term rehab.  ?-continue with PT/OT eval and treat ? ? ?Orthostatic hypotension ?Antihypertensives have been discontinued and  patient is currently using compression stockings. No orthostasis noted with PT/OT yesterday. Patient had abnormal ACTH stimulation test with inappropriately normal a.m. ACTH.  ?-Outpatient endocrinology work-up, consider repeat MRI ? ?Presumed C. difficile colitis ?Patient is currently on day 5/10 for fidaxomicin.  May need to transition to oral vancomycin upon discharge.   ?-Continue to fidaxomicin day 5/10 ? ?T2DM with neuropathy  ?Patient had episode of asymptomatic hypoglycemia this morning 54, improved to 121 after eating breakfast. Pt takes gabapentin 900 mg TID. Will decrease d/t generalized weakness and falls.  ?-Semglee 5 units nightly ?-Very sensitive SSI ?-Monitor CBG ?-decrease gabapentin to 600 mg TID, can consider keeping 900 mg at bed time if needed. Will assess.  ? ?Hypocalcemia ?Persistent issue, PTH appropriately elevated.  Vitamin D was checked yesterday and is low at 14.82.  ? ?Gross hematuria ?Ongoing issue, one episode during hospitalization ?-f/u with urology out patient  ? ?FEN/GI: Regular diet ?PPx: SCDs given anemia  ?Dispo:home vs SNF tomorrow ? ?Subjective:  ?Patient stated he was unable to make it to the commode last night and had diarrhea on the floor.  Expresses that he would like to go home to be with his children and help take care of them however also understands that the recommendation is for rehab at [facility].  Patient states he will think this over and make a decision tomorrow morning about dispo planning. ? ?Objective: ?Temp:  [97.5 ?F (36.4 ?C)-98.7 ?F (37.1 ?C)] 98.4 ?F (36.9 ?C) (03/26 0857) ?Pulse Rate:  [83-102] 91 (03/26 0857) ?Resp:  [16-19] 18 (03/26 0857) ?BP: (137-173)/(104-109) 173/109 (03/26 0857) ?SpO2:  [100 %] 100 % (03/26 0857) ?Weight:  [124.7 kg] 124.7 kg (03/26 0200) ?Physical Exam: ?General: Patient sitting up in bed, NAD ?Cardiovascular: Well perfused ?Respiratory: Breathing comfortably on room air ?Extremities: Wearing TED hose ?MSK: Able to  very slowly stand up  with walker, appeared unsteady upon standing ? ?Laboratory: ?Recent Labs  ?Lab 12/26/21 ?9528 12/27/21 ?4132 12/27/21 ?1804 12/28/21 ?4401 12/29/21 ?0272 12/30/21 ?0449  ?WBC 8.3 9.5  --  9.8  --   --   ?HGB 8.3* 7.9*   < > 8.7* 9.7* 9.0*  ?HCT 25.7* 24.3*   < > 25.6* 29.6* 27.5*  ?PLT 218 192  --  197  --   --   ? < > = values in this interval not displayed.  ? ?Recent Labs  ?Lab 12/26/21 ?0428 12/27/21 ?0729 12/28/21 ?5366  ?NA 137 134* 136  ?K 4.4 4.4 4.9  ?CL 101 101 106  ?CO2 '28 28 27  '$ ?BUN '16 16 18  '$ ?CREATININE 1.25* 1.32* 1.30*  ?CALCIUM 7.0* 7.1* 7.3*  ?GLUCOSE 172* 190* 130*  ? ? ? ? ?[name], DO ?12/30/2021, 10:37 AM ?PGY-1, [facility] ?[name] Intern pager: [phone], text pages welcome ? ?

## 2021-12-30 NOTE — Progress Notes (Signed)
CBG 54. Patient A&Ox4 and just received breakfast tray. No s/s of hypoglycemia. Will recheck CBG after patient finishes breakfast.  ?

## 2021-12-30 NOTE — Progress Notes (Signed)
Jeffrey Police, DO secure chat and let her know that patient's current BP is 184/118 and that Patient denies chest pain, denies shortness of breath, denies vision changes and denies headache. Jeffrey Ramp, DO stated "As long as he is asymptomatic we do not need to treat it. If he develops any symptoms as mentioned above or just over all is unwell please let us know." No new orders given at this time.  ?

## 2021-12-30 NOTE — Plan of Care (Signed)

## 2021-12-30 NOTE — Progress Notes (Signed)
CBG rechecked after patient ate and is 151. Patient did not like what was sent on first breakfast tray therefore new tray reordered and then he ate. Patient refused to eat or drink anything other than coffee before his new breakfast tray arrived. There have been no s/s of hypoglycemia and patient denied feeling like his blood sugar was low.  ?

## 2021-12-31 ENCOUNTER — Other Ambulatory Visit (HOSPITAL_COMMUNITY): Payer: Self-pay

## 2021-12-31 LAB — PROTEIN ELECTROPHORESIS, SERUM
A/G Ratio: 0.4 — ABNORMAL LOW (ref 0.7–1.7)
Albumin ELP: 1.4 g/dL — ABNORMAL LOW (ref 2.9–4.4)
Alpha-1-Globulin: 0.3 g/dL (ref 0.0–0.4)
Alpha-2-Globulin: 0.5 g/dL (ref 0.4–1.0)
Beta Globulin: 1.4 g/dL — ABNORMAL HIGH (ref 0.7–1.3)
Gamma Globulin: 1.3 g/dL (ref 0.4–1.8)
Globulin, Total: 3.5 g/dL (ref 2.2–3.9)
Total Protein ELP: 4.9 g/dL — ABNORMAL LOW (ref 6.0–8.5)

## 2021-12-31 LAB — GLUCOSE, CAPILLARY
Glucose-Capillary: 52 mg/dL — ABNORMAL LOW (ref 70–99)
Glucose-Capillary: 96 mg/dL (ref 70–99)
Glucose-Capillary: 150 mg/dL — ABNORMAL HIGH (ref 70–99)
Glucose-Capillary: 205 mg/dL — ABNORMAL HIGH (ref 70–99)

## 2021-12-31 MED ORDER — HYDROCERIN EX CREA
1.0000 "application " | TOPICAL_CREAM | Freq: Every day | CUTANEOUS | 0 refills | Status: DC
  Filled 2021-12-31: qty 113, 28d supply, fill #0

## 2021-12-31 MED ORDER — FERROUS SULFATE 325 (65 FE) MG PO TABS
325.0000 mg | ORAL_TABLET | ORAL | 0 refills | Status: DC
  Filled 2021-12-31: qty 30, 60d supply, fill #0

## 2021-12-31 MED ORDER — FIDAXOMICIN 200 MG PO TABS: 200.0000 mg | ORAL_TABLET | Freq: Two times a day (BID) | ORAL | 0 refills | Status: DC

## 2021-12-31 MED ORDER — CALCIUM CARBONATE 1250 (500 CA) MG PO TABS
1.0000 | ORAL_TABLET | Freq: Every day | ORAL | 0 refills | Status: DC
  Filled 2021-12-31: qty 30, 30d supply, fill #0

## 2021-12-31 MED ORDER — FIDAXOMICIN 200 MG PO TABS
200.0000 mg | ORAL_TABLET | Freq: Two times a day (BID) | ORAL | 0 refills | Status: AC
Start: 2021-12-31 — End: 2022-01-05
  Filled 2021-12-31: qty 2, 1d supply, fill #0
  Filled 2021-12-31: qty 10, 5d supply, fill #0

## 2021-12-31 MED ORDER — VITAMIN D3 25 MCG PO TABS
1000.0000 [IU] | ORAL_TABLET | Freq: Every day | ORAL | 0 refills | Status: DC
  Filled 2021-12-31: qty 30, 30d supply, fill #0

## 2021-12-31 MED ORDER — GABAPENTIN 300 MG PO CAPS
600.0000 mg | ORAL_CAPSULE | Freq: Three times a day (TID) | ORAL | 0 refills | Status: DC
  Filled 2021-12-31: qty 180, 30d supply, fill #0

## 2021-12-31 NOTE — Plan of Care (Signed)

## 2021-12-31 NOTE — Progress Notes (Signed)
Inpatient Diabetes Program Recommendations ? ?AACE/ADA: New Consensus Statement on Inpatient Glycemic Control (2015) ? ?Target Ranges:  Prepandial:   less than 140 mg/dL ?     Peak postprandial:   less than 180 mg/dL (1-2 hours) ?     Critically ill patients:  140 - 180 mg/dL  ? ?Lab Results  ?Component Value Date  ? GLUCAP 150 (H) 12/31/2021  ? HGBA1C 7.8 (A) 11/19/2021  ? ? ?Review of Glycemic Control ? Latest Reference Range & Units 12/30/21 07:23 12/30/21 08:58 12/30/21 11:28 12/30/21 16:37 12/30/21 20:54 12/31/21 07:30 12/31/21 08:23 12/31/21 11:28  ?Glucose-Capillary 70 - 99 mg/dL 54 (L) 151 (H) 189 (H) 190 (H) 181 (H) 52 (L) 96 150 (H)  ?(L): Data is abnormally low ?(H): Data is abnormally high ? ?Diabetes history: DM2 ?Outpatient Diabetes medications: Lantus 5 units QD, Trulicity 8.03 weekly, Metformin 500 mg BID ?Current orders for Inpatient glycemic control: Semglee 5 units, QHS, Novolog 0-6 units TID  ? ?Inpatient Diabetes Program Recommendations:   ? ?Hypoglycemic yesterday morning and again today.  Please consider: ? ?-DC Semglee ?-Novolog 3 units meal coverage TID if eats at least 50% ? ?Will continue to follow while inpatient. ? ?Thank you, ?Reche Dixon, MSN, RN ?Diabetes Coordinator ?Inpatient Diabetes Program ?[phone] (team pager from 8a-5p) ? ? ? ? ?

## 2021-12-31 NOTE — Discharge Instructions (Signed)
Thank you for letting us care for you during your stay. ? ?You were admitted to the [facility] Medicine Teaching Service.  ? ?You were admitted for acute anemia . ? ?We found the following during your stay: abnormal ACTH, fluctuating glucose levels, diarrhea.  ? ?We recommend follow up specifically with Endocrinology, Infectious Disease, Urology.  ? ? ?Please follow up with your primary care physician in 1 week.  ? ?If your symptoms worsen or return, please return to the hospital. ? ?Please let us know if you have questions about your stay at [facility].  ?

## 2021-12-31 NOTE — Progress Notes (Signed)
Jeffrey [name] to be discharged Home per MD order. Discussed prescriptions and follow up appointments with the patient. Prescriptions and medication list explained in detail. Patient verbalized understanding. ?Patient already have medications from out patient pharmacy.  Verbalized understanding. ?Skin clean, dry and intact without evidence of skin break down, no evidence of skin tears noted. IV catheter discontinued intact. Site without signs and symptoms of complications. Dressing and pressure applied. Pt denies pain at the site currently. No complaints noted. ? ?Patient free of lines, drains, and wounds.  ? ?An After Visit Summary (AVS) was printed and given to the patient. ?Patient escorted via wheelchair, and discharged home via private auto. ? ?Amaryllis Dyke, RN  ?

## 2021-12-31 NOTE — TOC Transition Note (Signed)
Transition of Care (TOC) - CM/SW Discharge Note ? ? ?Patient Details  ?Name: Jeffrey [name] ?MRN: [id number] ?Date of Birth: 16-Mar-1966 ? ?Transition of Care (TOC) CM/SW Contact:  ?Tom-Johnson, Renea Ee, RN ?Phone Number: ?12/31/2021, 4:38 PM ? ? ?Clinical Narrative:    ? ?Patient is scheduled for discharge today. CM notified by [name] that patient has medical insurance but has not been paying his premium therefore, his prescriptions will not be covered. CM notified Supervisor and MATCH done due to no medication coverage. Medication delivered to patient at bedside by [facility] pharmacy. ?Outpatient PT/OT order on AVS. Rollator ordered but patient declined, stating he has one at home.  ?Family to transport at discharge. No further TOC needs noted. ? ?Final next level of care: OP Rehab ?Barriers to Discharge: Barriers Resolved ? ? ?Patient Goals and CMS Choice ?Patient states their goals for this hospitalization and ongoing recovery are:: To return home ?CMS Medicare.gov Compare Post Acute Care list provided to:: Patient ?Choice offered to / list presented to : NA ? ?Discharge Placement ?  ?           ?  ?Patient to be transferred to facility by: Family ?  ?  ? ?Discharge Plan and Services ?  ?Discharge Planning Services: CM Consult ?Post Acute Care Choice: NA          ?DME Arranged: Walker rolling with seat ?DME Agency: [employer] ?Date DME Agency Contacted: 12/31/21 ?Time DME Agency Contacted: 5400 ?Representative spoke with at DME Agency: Delana Meyer ?HH Arranged: NA ?[place] Agency: NA ?  ?  ?  ? ?Social Determinants of Health (SDOH) Interventions ?  ? ? ?Readmission Risk Interventions ? ?  12/21/2021  ?  3:10 PM 09/17/2021  ? 12:59 PM 08/01/2021  ?  1:24 PM  ?Readmission Risk Prevention Plan  ?Transportation Screening Complete Complete Complete  ?PCP or Specialist Appt within 5-7 Days   Complete  ?PCP or Specialist Appt within 3-5 Days Complete Complete   ?Home Care Screening   Patient refused  ?Medication Review  (RN CM)   Complete  ?[place] or Home Care Consult Complete Complete   ?Social Work Consult for [place] Planning/Counseling Complete Complete   ?Palliative Care Screening Not Applicable Not Applicable   ?Medication Review Press photographer) Complete Complete   ? ? ? ? ? ?

## 2021-12-31 NOTE — Progress Notes (Signed)
OT Cancellation Note ? ?Patient Details ?Name: Jeffrey [name] ?MRN: [id number] ?DOB: 1965-12-22 ? ? ?Cancelled Treatment:    Reason Eval/Treat Not Completed: Patient declined, no reason specified;Other (comment) Pt still working on breakfast tray; reports blood sugar was low this AM. Will follow-up as schedule permits for OT session ? ?Layla Maw ?12/31/2021, 8:23 AM ?

## 2021-12-31 NOTE — Progress Notes (Signed)
When asked about rollator pt said he already has one at home and doesn't need it.   ?

## 2021-12-31 NOTE — Progress Notes (Signed)
Occupational Therapy Treatment ?Patient Details ?Name: Jeffrey [name] ?MRN: [id number] ?DOB: 03-Aug-1966 ?Today's Date: 12/31/2021 ? ? ?History of present illness 56 y.o. male presenting with dizziness and falls for past few weeks. PMH is significant for HTN, HLD, CKD, MI, cardiac arrest, anoxic brain injury, T2DM, GERD, cirrhosis, Depression, anemia of chronic disease ?  ?OT comments ? Session focused on completion of ADLs and assessment of BP throughout to ensure safety with daily routine at home. Pt able to complete entirety of bathing task standing at sink and LB dressing with setup assist. No syncopal events during session. Encouraged pt to use tub bench initially at home to further decrease fall risk with pt agreeable. DC recs remain appropriate. ? ?BP sitting EOB: 177/118 (133), 84 HR ?BP with initial standing: 197/125 (143) 803 HR ?BP after standing >7 min at sink: 146/129 (134), 104 HR  ? ?Recommendations for follow up therapy are one component of a multi-disciplinary discharge planning process, led by the attending physician.  Recommendations may be updated based on patient status, additional functional criteria and insurance authorization. ?   ?Follow Up Recommendations ? No OT follow up  ?  ?Assistance Recommended at Discharge Intermittent Supervision/Assistance  ?Patient can return home with the following ? Assistance with cooking/housework;Assist for transportation;Help with stairs or ramp for entrance ?  ?Equipment Recommendations ? None recommended by OT  ?  ?Recommendations for Other Services   ? ?  ?Precautions / Restrictions Precautions ?Precautions: Fall;Other (comment) ?Precaution Comments: monitor orthostatics; TED hose ?Restrictions ?Weight Bearing Restrictions: No  ? ? ?  ? ?Mobility Bed Mobility ?Overal bed mobility: Modified Independent ?Bed Mobility: Supine to Sit ?  ?  ?  ?  ?  ?  ?  ? ?Transfers ?Overall transfer level: Modified independent ?Equipment used: Rolling walker (2  wheels) ?Transfers: Sit to/from Stand ?Sit to Stand: Modified independent (Device/Increase time) ?  ?  ?  ?  ?  ?General transfer comment: from elevated bed ?  ?  ?Balance Overall balance assessment: Needs assistance, History of Falls ?Sitting-balance support: No upper extremity supported, Feet supported ?Sitting balance-Leahy Scale: Good ?  ?  ?Standing balance support: Bilateral upper extremity supported, During functional activity ?Standing balance-Leahy Scale: Fair ?Standing balance comment: able to stand without support, BUE for mobility ?  ?  ?  ?  ?  ?  ?  ?  ?  ?  ?  ?   ? ?ADL either performed or assessed with clinical judgement  ? ?ADL Overall ADL's : Needs assistance/impaired ?  ?  ?Grooming: Set up;Standing;Wash/dry face;Applying deodorant ?  ?Upper Body Bathing: Set up;Standing ?  ?Lower Body Bathing: Set up;Sit to/from stand ?  ?  ?  ?Lower Body Dressing: Set up;Sit to/from stand ?Lower Body Dressing Details (indicate cue type and reason): donning underwear, pants ?  ?  ?  ?  ?  ?  ?  ?General ADL Comments: able to stand at sink for entirety of bathing task without syncopal symptoms. collab on fall prevention strategies; use of tub bench initially at home. reports using AE for LB dressing at home but able to dress self without assist and without AE this AM ?  ? ?Extremity/Trunk Assessment Upper Extremity Assessment ?Upper Extremity Assessment: Overall WFL for tasks assessed ?  ?Lower Extremity Assessment ?Lower Extremity Assessment: Defer to PT evaluation ?  ?  ?  ? ?Vision   ?Vision Assessment?: No apparent visual deficits ?  ?Perception   ?  ?Praxis   ?  ? ?  Cognition Arousal/Alertness: Awake/alert ?Behavior During Therapy: [facility] for tasks assessed/performed ?Overall Cognitive Status: History of cognitive impairments - at baseline ?  ?  ?  ?  ?  ?  ?  ?  ?  ?  ?  ?  ?  ?  ?  ?  ?General Comments: hx of anoxic brain injury; pleasant and appropriate throughout session; shows insight into deficits and  safety concerns though slower processing; requires redirections due to talkative ?  ?  ?   ?Exercises   ? ?  ?Shoulder Instructions   ? ? ?  ?General Comments TED hose in place  ? ? ?Pertinent Vitals/ Pain       Pain Assessment ?Pain Assessment: No/denies pain ? ?Home Living   ?  ?  ?  ?  ?  ?  ?  ?  ?  ?  ?  ?  ?  ?  ?  ?  ?  ?  ? ?  ?Prior Functioning/Environment    ?  ?  ?  ?   ? ?Frequency ? Min 2X/week  ? ? ? ? ?  ?Progress Toward Goals ? ?OT Goals(current goals can now be found in the care plan section) ? Progress towards OT goals: Progressing toward goals ? ?Acute Rehab OT Goals ?Patient Stated Goal: go home today ?OT Goal Formulation: With patient ?Time For Goal Achievement: 01/04/22 ?Potential to Achieve Goals: Good ?ADL Goals ?Pt Will Transfer to Toilet: with modified independence;ambulating ?Pt Will Perform Tub/Shower Transfer: Tub transfer;with set-up;ambulating ?Additional ADL Goal #1: Pt to increase standing activity tolerance > 7 min implementing appropriate syncopal episode precautions prn ?Additional ADL Goal #2: Pt to gather ADL/IADL items MOD I  ?Plan Discharge plan remains appropriate   ? ?Co-evaluation ? ? ?   ?  ?  ?  ?  ? ?  ?AM-PAC OT "6 Clicks" Daily Activity     ?Outcome Measure ? ? Help from another person eating meals?: None ?Help from another person taking care of personal grooming?: A Little ?Help from another person toileting, which includes using toliet, bedpan, or urinal?: A Little ?Help from another person bathing (including washing, rinsing, drying)?: A Little ?Help from another person to put on and taking off regular upper body clothing?: A Little ?Help from another person to put on and taking off regular lower body clothing?: A Little ?6 Click Score: 19 ? ?  ?End of Session Equipment Utilized During Treatment: Gait belt;Rollator (4 wheels) ? ?OT Visit Diagnosis: Unsteadiness on feet (R26.81) ?  ?Activity Tolerance Patient tolerated treatment well ?  ?Patient Left in chair;with  call bell/phone within reach;with nursing/sitter in room ?  ?Nurse Communication Mobility status ?  ? ?   ? ?Time: 2703-5009 ?OT Time Calculation (min): 42 min ? ?Charges: OT General Charges ?$OT Visit: 1 Visit ?OT Treatments ?$Self Care/Home Management : 38-52 mins ? ?Malachy Chamber, OTR/L ?Acute Rehab Services ?Office: [phone]  ? ?Jeffrey [name] ?12/31/2021, 10:32 AM ?

## 2021-12-31 NOTE — Discharge Summary (Addendum)
Family Medicine Teaching Service ?Hospital Discharge Summary ? ?Patient name: Jeffrey [name] Medical record number: [id number] ?Date of birth: December 02, 1965 Age: 56 y.o. Gender: male ?Date of Admission: 12/20/2021  Date of Discharge: 12/31/21 ?Admitting Physician: Leeanne Rio, MD ? ?Primary Care Provider: [name], MD ?Consultants: GI, cardiology, hematology/oncology ? ?Indication for Hospitalization: Acute on Chronic Anemia ? ?Discharge Diagnoses/Problem List:  ?Normocytic anemia ?Orthostatic hypotension ?Presumed C. difficile colitis ?Type 2 diabetes with neuropathy ?Hypocalcemia ? ? ?Disposition: Home with home health PT ? ?Discharge Condition: Stable ? ?Discharge Exam:  ?General: Patient sitting up in bed, NAD ?Cardiovascular: RRR, ?Respiratory: Breathing comfortably on room air ?Extremities: Wearing TED hose ?MSK: able to move to walker. More steady today than yesterday ? ?Brief Hospital Course:  ?Jeffrey [name] is a 56 year old male who was admitted for falls and weakness. PMH significant for HTN, HLD, CKD, MI, cardiac arrest, anoxic brain injury, T2DM, GERD, cirrhosis, depression, anemia of chronic disease. Hospital course is outlined below: ? ?Generalized weakness/falls with malnutrition ?Presented with 2-3 weeks of pre-syncope and falls. Labs remarkable for glu 302 with pH 7.47, bicarb 28, AG 12 therefore not in DKA. Ionized ca of 0.95 which was repleted with calcium gluconate 2g IV in ED. Hgb 7.6, WBC WNL, Cr elevated 1.83, alk phos 237. CRX unremarkable. ECG shows sinus tach of 120 bpm and prolonged QTc to 501 and trop 17>15.  GI and RD were consulted for poor p.o. intake.  Patient had both colonoscopy and EGD performed which were both unremarkable.  Patient was very dizzy and presyncopal with exertion despite holding losartan for several days.  Echo was performed which showed 65 to 70% EF with no regional wall motion abnormalities, which is similar to October 2022 echo.  Cardiology was  consulted and recommended ACTH stim test to rule out adrenal insufficiency as well as starting compression hoses but no further cardiac work-up was necessary.  Abnormal ACTH stim test showed inappropriately normal a.m. 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  ? ?Anemia of chronic disease ?Patient's hemoglobin was persistently in the 7.0-9.0 range.  Patient received 3 units PRBC during this hospitalization.  ? ?T2DM ?Patient CBG greatly fluctuated in the setting of minimal insulin use from 20s to 300s.  Patient was restarted on 5 units Lantus daily at bedtime with very sensitive sliding scale insulin. ? ?Acute hypotension with + orthostatics ?Patient initially had positive orthostatics but repeat orthostatics were negative.  ? ?AKI on CKD stage IIIa ?Creatinine slightly elevated to 1.56, baseline 0.7-1.0.  This was likely prerenal given his bowel prep for EGD/colonoscopy.  Nephrotoxic medications were avoided and BMP was monitored throughout hospitalization. ? ?Patient's other chronic conditions were monitored and home medications were resumed as needed (seizure disorder, hyperlipidemia, GERD, history of EtOH use disorder, history of MI) ? ?Follow up items: ?Consider ID referral outpatient for fecal transplant ?Ensure completion of antibiotics for presumed C. difficile colitis. Should have finished full 10 day of fidaxomicin by time of hospital follow up visit ?Recommend urology referral for chronic gross hematuria. 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 ?Assess fluid status  ?Assess CBGs and need for insulin ?Assess need for ASA. Held in setting of chronic anemia ? ? ?Significant Procedures: 3/19 colonoscopy ? ?Significant Labs and Imaging:  ?Recent Labs  ?Lab 12/26/21 ?7616 12/27/21 ?0737 12/27/21 ?1804 12/28/21 ?1062 12/29/21 ?6948 12/30/21 ?0449  ?WBC 8.3 9.5  --  9.8  --   --    ?  HGB 8.3* 7.9*   < > 8.7* 9.7* 9.0*  ?HCT 25.7* 24.3*   < > 25.6* 29.6* 27.5*  ?PLT 218 192  --  197  --   --   ? < > = values in this interval not displayed.  ? ?Recent Labs  ?Lab 12/25/21 ?7903 12/26/21 ?8333 12/27/21 ?8329 12/28/21 ?1916 12/29/21 ?0319  ?NA 134* 137 134* 136  --   ?K 4.6 4.4 4.4 4.9  --   ?CL 100 101 101 106  --   ?CO2 _0 --   ?GLUCOSE 189* 172* 190* 130*  --   ?BUN _1 --   ?CREATININE 1.35* 1.25* 1.32* 1.30*  --   ?CALCIUM 7.0* 7.0* 7.1* 7.3*  --   ?MG 1.2* 1.2* 1.8 1.7  --   ?PHOS 2.2* 2.4*  --  2.2* 2.7  ? ? ? ? ?Results/Tests Pending at Time of Discharge: SPEP ? ?Discharge Medications:  ?Allergies as of 12/31/2021   ? ?   Reactions  ? Atorvastatin Other (See Comments)  ? Cause Muscle Weakness  ? Drug Ingredient [spironolactone] Other (See Comments)  ? Hyperkalemia  ? Lisinopril Other (See Comments)  ? Pt reports nose bleeds  ? Losartan Other (See Comments)  ? Patient stopped taking this because he felt like passing out after standing up  ? ?  ? ?  ?Medication List  ?  ? ?STOP taking these medications   ? ?aspirin EC 81 MG tablet ?  ?furosemide 20 MG tablet ?Commonly known as: LASIX ?  ?furosemide 40 MG tablet ?Commonly known as: LASIX ?  ?Lantus SoloStar 100 UNIT/ML Solostar Pen ?Generic drug: insulin glargine ?  ?losartan 50 MG tablet ?Commonly known as: COZAAR ?  ?mirtazapine 30 MG tablet ?Commonly known as: REMERON ?  ? ?  ? ?TAKE these medications   ? ?blood glucose meter kit and supplies ?Dispense based on patient and insurance preference. Use up to four times daily as directed. (FOR ICD-10 E10.9, E11.9). dispense 90 test strips and 90 lancets. ?  ?calcium carbonate 1250 (500 Ca) MG tablet ?Commonly known as: OS-CAL - dosed in mg of elemental calcium ?Take 1 tablet (500 mg of elemental calcium total) by mouth daily with breakfast. ?Start taking on: January 01, 2022 ?  ?Dificid 200 MG Tabs tablet ?Generic drug: fidaxomicin ?Take 1 tablet (200 mg total) by mouth 2 (two)  times daily for 10 doses. (Remaining doses coming via mail). ?  ?DULoxetine 30 MG capsule ?Commonly known as: Cymbalta ?Take 1 capsule (30 mg total) by mouth daily. ?What changed: when to take this ?  ?Ensure Max Protein Liqd ?Take 330 mLs (11 oz total) by mouth 3 (three) times daily between meals. ?  ?FeroSul 325 (65 FE) MG tablet ?Generic drug: ferrous sulfate ?Take 1 tablet (325 mg total) by mouth every other day. ?Start taking on: January 03, 6059 ?  ?folic acid 1 MG tablet ?Commonly known as: FOLVITE ?Take 1 tablet by mouth once daily ?What changed: when to take this ?  ?FreeStyle Libre 2 Sensor Misc ?Use as directed ?  ?gabapentin 300 MG capsule ?Commonly known as: NEURONTIN ?Take 2 capsules (600 mg total) by mouth 3 (three) times daily. ?What changed: how much to take ?  ?hydrocerin Crea ?Apply 1 application. topically daily. ?Start taking on: January 01, 2022 ?  ?levETIRAcetam 1000 MG tablet ?Commonly known as: KEPPRA ?Take 1 tablet (1,000 mg total) by mouth 2 (two) times daily. ?  ?  metFORMIN 500 MG 24 hr tablet ?Commonly known as: GLUCOPHAGE-XR ?TAKE 1 TABLET BY MOUTH TWICE DAILY WITH A MEAL ?What changed: when to take this ?  ?multivitamin with minerals Tabs tablet ?Take 1 tablet by mouth daily. ?What changed: when to take this ?  ?pantoprazole 40 MG tablet ?Commonly known as: Protonix ?Take 1 tablet (40 mg total) by mouth 2 (two) times daily. ?What changed: when to take this ?  ?thiamine 100 MG tablet ?Commonly known as: Vitamin B-1 ?Take 1 tablet (100 mg total) by mouth daily. ?What changed: when to take this ?  ?Trulicity 6.12 AE/4.9PN Sopn ?Generic drug: Dulaglutide ?Inject 0.75 mg into the skin once a week. ?What changed: when to take this ?  ?Vitamin D3 25 MCG tablet ?Commonly known as: Vitamin D ?Take 1 tablet (1,000 Units total) by mouth daily. ?Start taking on: January 01, 2022 ?  ? ?  ? ? ?Discharge Instructions: Please refer to Patient Instructions section of EMR for full details.  Patient was  counseled important signs and symptoms that should prompt return to medical care, changes in medications, dietary instructions, activity restrictions, and follow up appointments.  ? ?Follow-Up Appointments: ? Follow-

## 2021-12-31 NOTE — Progress Notes (Signed)
Physical Therapy Treatment ?Patient Details ?Name: Jeffrey [name] ?MRN: [id number] ?DOB: 1966-04-12 ?Today's Date: 12/31/2021 ? ? ?History of Present Illness 56 y.o. male presenting with dizziness and falls for past few weeks. PMH is significant for HTN, HLD, CKD, MI, cardiac arrest, anoxic brain injury, T2DM, GERD, cirrhosis, Depression, anemia of chronic disease ? ?  ?PT Comments  ? ? Pt required min guard assist supine to sit, min guard assist sit to stand, min guard assist ambulation 175' with RW, and min assist sit to supine. VSS. No s/s of orthostasis or syncope. Pt reports he still feels a little weak but close to being back to his baseline. Discharge recommendations updated to HHPT. ?   ?Recommendations for follow up therapy are one component of a multi-disciplinary discharge planning process, led by the attending physician.  Recommendations may be updated based on patient status, additional functional criteria and insurance authorization. ? ?Follow Up Recommendations ? Home health PT ?  ?  ?Assistance Recommended at Discharge Intermittent Supervision/Assistance  ?Patient can return home with the following A little help with walking and/or transfers;Assistance with cooking/housework ?  ?Equipment Recommendations ? Rollator (4 wheels)  ?  ?Recommendations for Other Services   ? ? ?  ?Precautions / Restrictions Precautions ?Precautions: Fall;Other (comment) ?Precaution Comments: monitor orthostatics; TED hose ?Restrictions ?Weight Bearing Restrictions: No  ?  ? ?Mobility ? Bed Mobility ?Overal bed mobility: Needs Assistance ?Bed Mobility: Sit to Supine, Supine to Sit ?  ?  ?Supine to sit: Min guard, HOB elevated ?Sit to supine: Min assist, HOB elevated ?  ?General bed mobility comments: +rail, increased time, assist with BLE back to bed ?  ? ?Transfers ?Overall transfer level: Needs assistance ?Equipment used: Rolling walker (2 wheels) ?Transfers: Sit to/from Stand ?Sit to Stand: Min guard, From elevated  surface ?  ?  ?  ?  ?  ?  ?  ? ?Ambulation/Gait ?Ambulation/Gait assistance: Min guard ?Gait Distance (Feet): 175 Feet ?Assistive device: Rolling walker (2 wheels) ?Gait Pattern/deviations: Step-through pattern, Decreased stride length ?Gait velocity: decreased ?Gait velocity interpretation: <1.31 ft/sec, indicative of household ambulator ?  ?General Gait Details: min guard for safety due to hx of syncopal episodes with falls. VSS. No s/s of orthostasis/syncope. ? ? ?Stairs ?  ?  ?  ?  ?  ? ? ?Wheelchair Mobility ?  ? ?Modified Rankin (Stroke Patients Only) ?  ? ? ?  ?Balance Overall balance assessment: Needs assistance, History of Falls ?Sitting-balance support: No upper extremity supported, Feet supported ?Sitting balance-Leahy Scale: Good ?  ?  ?Standing balance support: Bilateral upper extremity supported, During functional activity ?Standing balance-Leahy Scale: Fair ?  ?  ?  ?  ?  ?  ?  ?  ?  ?  ?  ?  ?  ? ?  ?Cognition Arousal/Alertness: Awake/alert ?Behavior During Therapy: [facility] for tasks assessed/performed ?Overall Cognitive Status: History of cognitive impairments - at baseline ?  ?  ?  ?  ?  ?  ?  ?  ?  ?  ?  ?  ?  ?  ?  ?  ?  ?  ?  ? ?  ?Exercises   ? ?  ?General Comments General comments (skin integrity, edema, etc.): TED hose in place on arrival. Ankle pumps performed sitting EOB prior to amb. VSS ?  ?  ? ?Pertinent Vitals/Pain Pain Assessment ?Pain Assessment: No/denies pain  ? ? ?Home Living   ?  ?  ?  ?  ?  ?  ?  ?  ?  ?   ?  ?  Prior Function    ?  ?  ?   ? ?PT Goals (current goals can now be found in the care plan section) Acute Rehab PT Goals ?Patient Stated Goal: home ?Progress towards PT goals: Progressing toward goals ? ?  ?Frequency ? ? ? Min 3X/week ? ? ? ?  ?PT Plan Discharge plan needs to be updated  ? ? ?Co-evaluation   ?  ?  ?  ?  ? ?  ?AM-PAC PT "6 Clicks" Mobility   ?Outcome Measure ? Help needed turning from your back to your side while in a flat bed without using bedrails?: None ?Help  needed moving from lying on your back to sitting on the side of a flat bed without using bedrails?: A Little ?Help needed moving to and from a bed to a chair (including a wheelchair)?: A Little ?Help needed standing up from a chair using your arms (e.g., wheelchair or bedside chair)?: A Little ?Help needed to walk in hospital room?: A Little ?Help needed climbing 3-5 steps with a railing? : A Lot ?6 Click Score: 18 ? ?  ?End of Session Equipment Utilized During Treatment: Gait belt ?Activity Tolerance: Patient tolerated treatment well ?Patient left: in bed;with call bell/phone within reach;with bed alarm set ?Nurse Communication: Mobility status ?PT Visit Diagnosis: Unsteadiness on feet (R26.81);Muscle weakness (generalized) (M62.81);History of falling (Z91.81);Other abnormalities of gait and mobility (R26.89) ?  ? ? ?Time: 8270-7867 ?PT Time Calculation (min) (ACUTE ONLY): 24 min ? ?Charges:  $Gait Training: 23-37 mins          ?          ? ?Lorrin Goodell, PT  ?Office # [phone] ?Pager [phone] ? ? ? ?Lorriane Shire ?12/31/2021, 9:48 AM ? ?

## 2022-01-01 ENCOUNTER — Emergency Department (HOSPITAL_COMMUNITY)
Admission: EM | Admit: 2022-01-01 | Discharge: 2022-01-01 | Disposition: A | Discharge status: Home / Self Care | Payer: Commercial Managed Care - HMO | Attending: Emergency Medicine | Admitting: Emergency Medicine

## 2022-01-01 ENCOUNTER — Encounter (HOSPITAL_COMMUNITY): Payer: Self-pay

## 2022-01-01 ENCOUNTER — Emergency Department (HOSPITAL_COMMUNITY): Payer: Commercial Managed Care - HMO

## 2022-01-01 ENCOUNTER — Other Ambulatory Visit: Payer: Self-pay

## 2022-01-01 DIAGNOSIS — Z7984 Long term (current) use of oral hypoglycemic drugs: Secondary | ICD-10-CM | POA: Insufficient documentation

## 2022-01-01 DIAGNOSIS — E11649 Type 2 diabetes mellitus with hypoglycemia without coma: Secondary | ICD-10-CM | POA: Diagnosis not present

## 2022-01-01 DIAGNOSIS — E162 Hypoglycemia, unspecified: Secondary | ICD-10-CM

## 2022-01-01 DIAGNOSIS — Y92009 Unspecified place in unspecified non-institutional (private) residence as the place of occurrence of the external cause: Secondary | ICD-10-CM | POA: Diagnosis not present

## 2022-01-01 DIAGNOSIS — Z20822 Contact with and (suspected) exposure to covid-19: Secondary | ICD-10-CM | POA: Insufficient documentation

## 2022-01-01 DIAGNOSIS — R531 Weakness: Secondary | ICD-10-CM | POA: Diagnosis present

## 2022-01-01 DIAGNOSIS — W19XXXA Unspecified fall, initial encounter: Secondary | ICD-10-CM | POA: Insufficient documentation

## 2022-01-01 DIAGNOSIS — I10 Essential (primary) hypertension: Secondary | ICD-10-CM | POA: Diagnosis not present

## 2022-01-01 DIAGNOSIS — R55 Syncope and collapse: Secondary | ICD-10-CM | POA: Insufficient documentation

## 2022-01-01 LAB — BASIC METABOLIC PANEL
Anion gap: 6 (ref 5–15)
BUN: 25 mg/dL — ABNORMAL HIGH (ref 6–20)
CO2: 23 mmol/L (ref 22–32)
Calcium: 8.1 mg/dL — ABNORMAL LOW (ref 8.9–10.3)
Chloride: 106 mmol/L (ref 98–111)
Creatinine, Ser: 1.45 mg/dL — ABNORMAL HIGH (ref 0.61–1.24)
GFR, Estimated: 56 mL/min — ABNORMAL LOW (ref >60)
Glucose, Bld: 56 mg/dL — ABNORMAL LOW (ref 70–99)
Potassium: 5.5 mmol/L — ABNORMAL HIGH (ref 3.5–5.1)
Sodium: 135 mmol/L (ref 135–145)

## 2022-01-01 LAB — CBG MONITORING, ED
Glucose-Capillary: 27 mg/dL — CL (ref 70–99)
Glucose-Capillary: 66 mg/dL — ABNORMAL LOW (ref 70–99)
Glucose-Capillary: 79 mg/dL (ref 70–99)
Glucose-Capillary: 150 mg/dL — ABNORMAL HIGH (ref 70–99)

## 2022-01-01 LAB — HEPATIC FUNCTION PANEL
ALT: 49 U/L — ABNORMAL HIGH (ref 0–44)
AST: 85 U/L — ABNORMAL HIGH (ref 15–41)
Albumin: <1.5 g/dL — ABNORMAL LOW (ref 3.5–5.0)
Alkaline Phosphatase: 292 U/L — ABNORMAL HIGH (ref 38–126)
Bilirubin, Direct: 0.3 mg/dL — ABNORMAL HIGH (ref 0.0–0.2)
Indirect Bilirubin: 0.4 mg/dL (ref 0.3–0.9)
Total Bilirubin: 0.7 mg/dL (ref 0.3–1.2)
Total Protein: 5.8 g/dL — ABNORMAL LOW (ref 6.5–8.1)

## 2022-01-01 LAB — CBC
HCT: 33 % — ABNORMAL LOW (ref 39.0–52.0)
Hemoglobin: 10.5 g/dL — ABNORMAL LOW (ref 13.0–17.0)
MCH: 25.7 pg — ABNORMAL LOW (ref 26.0–34.0)
MCHC: 31.8 g/dL (ref 30.0–36.0)
MCV: 80.9 fL (ref 80.0–100.0)
Platelets: 284 10*3/uL (ref 150–400)
RBC: 4.08 MIL/uL — ABNORMAL LOW (ref 4.22–5.81)
RDW: 27.6 % — ABNORMAL HIGH (ref 11.5–15.5)
WBC: 10.7 10*3/uL — ABNORMAL HIGH (ref 4.0–10.5)
nRBC: 0 % (ref 0.0–0.2)

## 2022-01-01 LAB — URINALYSIS, ROUTINE W REFLEX MICROSCOPIC
Bilirubin Urine: NEGATIVE
Glucose, UA: NEGATIVE mg/dL
Ketones, ur: 5 mg/dL — AB
Nitrite: NEGATIVE
Protein, ur: 100 mg/dL — AB
RBC / HPF: >50 RBC/hpf — ABNORMAL HIGH (ref 0–5)
Specific Gravity, Urine: 1.013 (ref 1.005–1.030)
WBC, UA: >50 WBC/hpf — ABNORMAL HIGH (ref 0–5)
pH: 6 (ref 5.0–8.0)

## 2022-01-01 LAB — PHOSPHORUS: Phosphorus: 3.2 mg/dL (ref 2.5–4.6)

## 2022-01-01 LAB — RESP PANEL BY RT-PCR (FLU A&B, COVID) ARPGX2
Influenza A by PCR: NEGATIVE
Influenza B by PCR: NEGATIVE
SARS Coronavirus 2 by RT PCR: NEGATIVE

## 2022-01-01 LAB — MAGNESIUM: Magnesium: 1.4 mg/dL — ABNORMAL LOW (ref 1.7–2.4)

## 2022-01-01 MED ORDER — HYDRALAZINE HCL 20 MG/ML IJ SOLN
10.0000 mg | Freq: Once | INTRAMUSCULAR | Status: AC
  Administered 2022-01-01: 10 mg via INTRAVENOUS
  Filled 2022-01-01: qty 1

## 2022-01-01 MED ORDER — DEXTROSE 50 % IV SOLN
INTRAVENOUS | Status: AC
  Filled 2022-01-01: qty 50

## 2022-01-01 NOTE — ED Triage Notes (Signed)
BIB [facility] from home after pt called to report having fallen. Per EMS, pt had recent hospitalization for dizziness and was found to to be orthostatic and taken off multiple cardiac meds. Pt denies LOC or head injury.  ?

## 2022-01-01 NOTE — ED Notes (Signed)
Pt is calling [employer] for a ride. Provided pt with cell phone and charger to call ride. ?

## 2022-01-01 NOTE — ED Notes (Signed)
RN provided pt OJ and pt drank OJ. ? ?RN will reassess CBG 15 minutes at 1550 ?

## 2022-01-01 NOTE — H&P (Signed)
Family Medicine Teaching Service ?Service Pager: [phone] ? ? ? ?Was called by ED provider to evaluate patient because he was just discharged from our service a day ago. He had another fall this morning while cleaning his house. CT was unremarkable. He was hypoglycemic when coming in that responded appropriately after drinking OJ. His blood pressure was elevated to 206/121, but his blood pressure medication was held after previous admission due to hypotension  ? ?Talked to patient in ED and he stated he was walking around his bedroom and fell around 1AM. He states he felt dizzy prior to falling. He was not using a walker to move around, he does have a rollator but does not use it around the house because he walks well normally. He uses the rollator when he goes out. States he hit the top of his head on the cabinet of his TV, denies any bleeding. The dizziness only happens when he's walking and recalls that his blood pressure goes "way down" when he's walking. He feels lightheaded and then dizzy during this period. Recalls this is what he felt during his prior admission. Denies eating today or last night. He has not taken any insulin either. States he didn't eat because he wasn't hungry. States he came to the hospital to get more answers. He does not want to go to a facility. Denies headaches or vision changes.  ? ?Scheduled appointment for tomorrow afternoon at the family medicine clinic.  We will try to get him set up with home health physical therapy tomorrow at the clinic.  Recommended that he use his rollator when walking around the house and to make sure he eats to prevent from being hypoglycemic.  Will recheck his blood pressure at the clinic, and will likely need to restart blood pressure medication tomorrow. Patient appreciative and agreeable with plan. ? ? ?Shary Key, D.O. ?[place] [facility], PGY-2 ? ? ? ?

## 2022-01-01 NOTE — ED Notes (Signed)
E-signature pad unavailable at time of pt discharge. This RN discussed discharge materials with pt and answered all pt questions. Pt stated understanding of discharge material. ? ?

## 2022-01-01 NOTE — ED Notes (Signed)
PT CBG 27 at bedside. Pt alert. Per MD Dykstra pt to have PO intake. Pt given 8 0z of orange juice. RN to recheck.  ?

## 2022-01-01 NOTE — Discharge Instructions (Addendum)
You have been evaluated for your recent fall.  This is likely due to having ow blood sugar causing generalized weakness.  Please eat appropriately and check your blood sugar regularly.  Your blood pressure is high today, you would likely need to resume your blood pressure medication.  You also need to follow-up closely with your primary care provider tomorrow for outpatient management of your health.  You would likely benefit from both physical therapy and Occupational Therapy to help regain your strength.  Return to the ER if you have any concern. ?

## 2022-01-01 NOTE — ED Provider Notes (Signed)
?[facility] ?Provider Note ? ? ?CSN: [id number] ?Arrival date & time: 01/01/22  1444 ? ?  ? ?History ? ?Chief Complaint  ?Patient presents with  ? Fall  ? ? ?Jeffrey [name] is a 56 y.o. male. ? ?The history is provided by the patient and medical records. No language interpreter was used.  ? ?56 year old male significant history of diabetes, hypertension, alcohol abuse, seizures, brought here via EMS with complaints of fall.  Patient report he was recently released from the hospital after 10 days hospital stay due to having recurrent falls and weakness.  He attributed to his blood pressure medication causing his weakness.  He was released yesterday.  His last meal was around 5 PM yesterday.  This morning around 1 AM he was getting up to use the bathroom and walk around.  He reported he was feeling quite jittery and weak and while walking he fell to the ground.  He did hit his head against a TV stand but denies any significant head injury.  He did not report any loss of consciousness.  He mention he has been falling like this in the past usually when he fell to the ground he does have difficulty getting up.  He decided to sleep on the ground and once his strength was regained he was able to call EMS for help.  At this time he is without any complaint.  Denies any significant headache neck pain chest pain trouble breathing abdominal pain back pain not having any focal numbness or focal weakness.  He has been compliant with his medication.  He is not any blood thinner medication in the past.  He has history of seizure but does not think that he had any seizure.  He normally uses a rollator to navigate around but did not use it this morning.  He is currently being treated for C. difficile that he developed while he was hospitalized.  States he has had recurrent diarrhea and is taking medication for that. ? ? ? ? ? ? ?Home Medications ?Prior to Admission medications   ?Medication  Sig Start Date End Date Taking? Authorizing Provider  ?blood glucose meter kit and supplies Dispense based on patient and insurance preference. Use up to four times daily as directed. (FOR ICD-10 E10.9, E11.9). dispense 90 test strips and 90 lancets. 09/17/21   Samuella Cota, MD  ?calcium carbonate (OS-CAL - DOSED IN MG OF ELEMENTAL CALCIUM) 1250 (500 Ca) MG tablet Take 1 tablet (500 mg of elemental calcium total) by mouth daily with breakfast. 01/01/22 01/31/22  Carollee Leitz, MD  ?Vitamin D3 (VITAMIN D) 25 MCG tablet Take 1 tablet (1,000 Units total) by mouth daily. 01/01/22   Carollee Leitz, MD  ?Continuous Blood Gluc Sensor (FREESTYLE LIBRE 2 SENSOR) MISC Use as directed ?Patient not taking: Reported on 12/20/2021 10/02/21   [name], MD  ?Dulaglutide (TRULICITY) 5.18 AC/1.6SA SOPN Inject 0.75 mg into the skin once a week. ?Patient taking differently: Inject 0.75 mg into the skin every Wednesday. 11/29/21   Leavy Cella, RPH-CPP  ?DULoxetine (CYMBALTA) 30 MG capsule Take 1 capsule (30 mg total) by mouth daily. ?Patient taking differently: Take 30 mg by mouth at bedtime. 11/22/21   Zenia Resides, MD  ?Ensure Max Protein (ENSURE MAX PROTEIN) LIQD Take 330 mLs (11 oz total) by mouth 3 (three) times daily between meals. 08/10/21   Holley Bouche, MD  ?ferrous sulfate 325 (65 FE) MG tablet Take 1 tablet (325 mg  total) by mouth every other day. 01/02/22   Carollee Leitz, MD  ?fidaxomicin (DIFICID) 200 MG TABS tablet Take 1 tablet (200 mg total) by mouth 2 (two) times daily for 10 doses. (Remaining doses coming via mail). 12/31/21 01/05/22  Carollee Leitz, MD  ?folic acid (FOLVITE) 1 MG tablet Take 1 tablet by mouth once daily ?Patient taking differently: Take 1 mg by mouth at bedtime. 10/02/21   [name], MD  ?gabapentin (NEURONTIN) 300 MG capsule Take 2 capsules (600 mg total) by mouth 3 (three) times daily. 12/31/21 01/30/22  Carollee Leitz, MD  ?hydrocerin (EUCERIN) CREA Apply 1 application. topically daily.  01/01/22   Carollee Leitz, MD  ?levETIRAcetam (KEPPRA) 1000 MG tablet Take 1 tablet (1,000 mg total) by mouth 2 (two) times daily. 09/17/21   Samuella Cota, MD  ?metFORMIN (GLUCOPHAGE-XR) 500 MG 24 hr tablet TAKE 1 TABLET BY MOUTH TWICE DAILY WITH A MEAL ?Patient taking differently: Take 500 mg by mouth in the morning and at bedtime. 12/04/21   [name], MD  ?Multiple Vitamin (MULTIVITAMIN WITH MINERALS) TABS tablet Take 1 tablet by mouth daily. ?Patient taking differently: Take 1 tablet by mouth daily with breakfast. 08/11/21   Holley Bouche, MD  ?pantoprazole (PROTONIX) 40 MG tablet Take 1 tablet (40 mg total) by mouth 2 (two) times daily. ?Patient taking differently: Take 40 mg by mouth at bedtime. 09/17/21 12/27/21  Samuella Cota, MD  ?thiamine (VITAMIN B-1) 100 MG tablet Take 1 tablet (100 mg total) by mouth daily. ?Patient taking differently: Take 100 mg by mouth at bedtime. 05/27/16   [name], DO  ?   ? ?Allergies    ?Atorvastatin, Drug ingredient [spironolactone], Lisinopril, and Losartan   ? ?Review of Systems   ?Review of Systems  ?All other systems reviewed and are negative. ? ?Physical Exam ?Updated Vital Signs ?BP (!) 206/121   Pulse 79   Temp 98.6 ?F (37 ?C)   Resp 16   SpO2 100%  ?Physical Exam ?Vitals and nursing note reviewed.  ?Constitutional:   ?   General: He is not in acute distress. ?   Appearance: He is well-developed. He is obese.  ?HENT:  ?   Head: Normocephalic and atraumatic.  ?Eyes:  ?   Extraocular Movements: Extraocular movements intact.  ?   Conjunctiva/sclera: Conjunctivae normal.  ?   Pupils: Pupils are equal, round, and reactive to light.  ?Cardiovascular:  ?   Rate and Rhythm: Normal rate and regular rhythm.  ?   Pulses: Normal pulses.  ?   Heart sounds: Normal heart sounds.  ?Pulmonary:  ?   Effort: Pulmonary effort is normal.  ?   Breath sounds: Normal breath sounds.  ?Abdominal:  ?   Palpations: Abdomen is soft.  ?   Tenderness: There is no abdominal  tenderness.  ?Musculoskeletal:  ?   Cervical back: Neck supple.  ?   Comments: anasarca  ?Skin: ?   Findings: No rash.  ?Neurological:  ?   Mental Status: He is alert and oriented to person, place, and time.  ?   GCS: GCS eye subscore is 4. GCS verbal subscore is 5. GCS motor subscore is 6.  ?   Motor: Weakness present.  ?   Comments: 4/5 strength to all 4 extremities with equal but poor effort  ? ? ?ED Results / Procedures / Treatments   ?Labs ?(all labs ordered are listed, but only abnormal results are displayed) ?Labs Reviewed  ?BASIC METABOLIC PANEL - Abnormal;  Notable for the following components:  ?    Result Value  ? Potassium 5.5 (*)   ? Glucose, Bld 56 (*)   ? BUN 25 (*)   ? Creatinine, Ser 1.45 (*)   ? Calcium 8.1 (*)   ? GFR, Estimated 56 (*)   ? All other components within normal limits  ?CBC - Abnormal; Notable for the following components:  ? WBC 10.7 (*)   ? RBC 4.08 (*)   ? Hemoglobin 10.5 (*)   ? HCT 33.0 (*)   ? MCH 25.7 (*)   ? RDW 27.6 (*)   ? All other components within normal limits  ?HEPATIC FUNCTION PANEL - Abnormal; Notable for the following components:  ? Total Protein 5.8 (*)   ? Albumin <1.5 (*)   ? AST 85 (*)   ? ALT 49 (*)   ? Alkaline Phosphatase 292 (*)   ? Bilirubin, Direct 0.3 (*)   ? All other components within normal limits  ?MAGNESIUM - Abnormal; Notable for the following components:  ? Magnesium 1.4 (*)   ? All other components within normal limits  ?CBG MONITORING, ED - Abnormal; Notable for the following components:  ? Glucose-Capillary 27 (*)   ? All other components within normal limits  ?CBG MONITORING, ED - Abnormal; Notable for the following components:  ? Glucose-Capillary 66 (*)   ? All other components within normal limits  ?CBG MONITORING, ED - Abnormal; Notable for the following components:  ? Glucose-Capillary 150 (*)   ? All other components within normal limits  ?RESP PANEL BY RT-PCR (FLU A&B, COVID) [id number]  ?PHOSPHORUS  ?URINALYSIS, ROUTINE W REFLEX MICROSCOPIC   ?CBG MONITORING, ED  ? ? ?EKG ?EKG Interpretation ? ?Date/Time:  Tuesday January 01 2022 15:36:07 EDT ?Ventricular Rate:  81 ?PR Interval:  146 ?QRS Duration: 98 ?QT Interval:  422 ?QTC Calculation: 490 ?R A

## 2022-01-02 ENCOUNTER — Inpatient Hospital Stay: Payer: Managed Care, Other (non HMO)

## 2022-01-05 LAB — ALDOSTERONE + RENIN ACTIVITY W/ RATIO
ALDO / PRA Ratio: 3 (ref 0.0–30.0)
Aldosterone: 1.3 ng/dL (ref 0.0–30.0)
PRA LC/MS/MS: 0.428 ng/mL/hr (ref 0.167–5.380)

## 2022-01-07 ENCOUNTER — Ambulatory Visit: Payer: 59 | Admitting: Family Medicine

## 2022-01-08 ENCOUNTER — Emergency Department (HOSPITAL_COMMUNITY): Payer: Commercial Managed Care - HMO

## 2022-01-08 ENCOUNTER — Inpatient Hospital Stay (HOSPITAL_COMMUNITY)
Admission: EM | Admit: 2022-01-08 | Discharge: 2022-01-17 | DRG: 312 | Disposition: A | Discharge status: Home Health | Payer: Commercial Managed Care - HMO | Attending: Family Medicine | Admitting: Family Medicine

## 2022-01-08 ENCOUNTER — Other Ambulatory Visit: Payer: Self-pay

## 2022-01-08 DIAGNOSIS — R680 Hypothermia, not associated with low environmental temperature: Secondary | ICD-10-CM | POA: Diagnosis present

## 2022-01-08 DIAGNOSIS — R531 Weakness: Secondary | ICD-10-CM

## 2022-01-08 DIAGNOSIS — E8809 Other disorders of plasma-protein metabolism, not elsewhere classified: Secondary | ICD-10-CM | POA: Diagnosis present

## 2022-01-08 DIAGNOSIS — G4733 Obstructive sleep apnea (adult) (pediatric): Secondary | ICD-10-CM

## 2022-01-08 DIAGNOSIS — F1010 Alcohol abuse, uncomplicated: Secondary | ICD-10-CM | POA: Diagnosis present

## 2022-01-08 DIAGNOSIS — B961 Klebsiella pneumoniae [K. pneumoniae] as the cause of diseases classified elsewhere: Secondary | ICD-10-CM | POA: Diagnosis present

## 2022-01-08 DIAGNOSIS — N179 Acute kidney failure, unspecified: Secondary | ICD-10-CM | POA: Diagnosis present

## 2022-01-08 DIAGNOSIS — E1140 Type 2 diabetes mellitus with diabetic neuropathy, unspecified: Secondary | ICD-10-CM | POA: Diagnosis not present

## 2022-01-08 DIAGNOSIS — Z79899 Other long term (current) drug therapy: Secondary | ICD-10-CM

## 2022-01-08 DIAGNOSIS — D638 Anemia in other chronic diseases classified elsewhere: Secondary | ICD-10-CM | POA: Diagnosis present

## 2022-01-08 DIAGNOSIS — W19XXXA Unspecified fall, initial encounter: Secondary | ICD-10-CM | POA: Diagnosis present

## 2022-01-08 DIAGNOSIS — R000 Tachycardia, unspecified: Secondary | ICD-10-CM | POA: Diagnosis present

## 2022-01-08 DIAGNOSIS — R9431 Abnormal electrocardiogram [ECG] [EKG]: Secondary | ICD-10-CM | POA: Diagnosis present

## 2022-01-08 DIAGNOSIS — I129 Hypertensive chronic kidney disease with stage 1 through stage 4 chronic kidney disease, or unspecified chronic kidney disease: Secondary | ICD-10-CM | POA: Diagnosis present

## 2022-01-08 DIAGNOSIS — E11649 Type 2 diabetes mellitus with hypoglycemia without coma: Secondary | ICD-10-CM | POA: Diagnosis not present

## 2022-01-08 DIAGNOSIS — K219 Gastro-esophageal reflux disease without esophagitis: Secondary | ICD-10-CM | POA: Diagnosis present

## 2022-01-08 DIAGNOSIS — I248 Other forms of acute ischemic heart disease: Secondary | ICD-10-CM | POA: Diagnosis present

## 2022-01-08 DIAGNOSIS — Z634 Disappearance and death of family member: Secondary | ICD-10-CM

## 2022-01-08 DIAGNOSIS — F419 Anxiety disorder, unspecified: Secondary | ICD-10-CM | POA: Diagnosis present

## 2022-01-08 DIAGNOSIS — B9689 Other specified bacterial agents as the cause of diseases classified elsewhere: Secondary | ICD-10-CM | POA: Diagnosis present

## 2022-01-08 DIAGNOSIS — E875 Hyperkalemia: Secondary | ICD-10-CM | POA: Diagnosis present

## 2022-01-08 DIAGNOSIS — K7460 Unspecified cirrhosis of liver: Secondary | ICD-10-CM | POA: Diagnosis present

## 2022-01-08 DIAGNOSIS — N1831 Chronic kidney disease, stage 3a: Secondary | ICD-10-CM | POA: Diagnosis present

## 2022-01-08 DIAGNOSIS — R569 Unspecified convulsions: Secondary | ICD-10-CM | POA: Diagnosis present

## 2022-01-08 DIAGNOSIS — Z888 Allergy status to other drugs, medicaments and biological substances status: Secondary | ICD-10-CM

## 2022-01-08 DIAGNOSIS — M79601 Pain in right arm: Secondary | ICD-10-CM | POA: Diagnosis not present

## 2022-01-08 DIAGNOSIS — R188 Other ascites: Secondary | ICD-10-CM | POA: Diagnosis present

## 2022-01-08 DIAGNOSIS — E1122 Type 2 diabetes mellitus with diabetic chronic kidney disease: Secondary | ICD-10-CM | POA: Diagnosis present

## 2022-01-08 DIAGNOSIS — E1165 Type 2 diabetes mellitus with hyperglycemia: Secondary | ICD-10-CM | POA: Diagnosis present

## 2022-01-08 DIAGNOSIS — Y906 Blood alcohol level of 120-199 mg/100 ml: Secondary | ICD-10-CM | POA: Diagnosis present

## 2022-01-08 DIAGNOSIS — I10 Essential (primary) hypertension: Secondary | ICD-10-CM | POA: Diagnosis present

## 2022-01-08 DIAGNOSIS — Z9181 History of falling: Secondary | ICD-10-CM

## 2022-01-08 DIAGNOSIS — F329 Major depressive disorder, single episode, unspecified: Secondary | ICD-10-CM | POA: Diagnosis present

## 2022-01-08 DIAGNOSIS — Z7985 Long-term (current) use of injectable non-insulin antidiabetic drugs: Secondary | ICD-10-CM

## 2022-01-08 DIAGNOSIS — Z789 Other specified health status: Secondary | ICD-10-CM | POA: Diagnosis not present

## 2022-01-08 DIAGNOSIS — I951 Orthostatic hypotension: Principal | ICD-10-CM | POA: Diagnosis present

## 2022-01-08 DIAGNOSIS — G40909 Epilepsy, unspecified, not intractable, without status epilepticus: Secondary | ICD-10-CM | POA: Diagnosis present

## 2022-01-08 DIAGNOSIS — Z8674 Personal history of sudden cardiac arrest: Secondary | ICD-10-CM

## 2022-01-08 DIAGNOSIS — Z7984 Long term (current) use of oral hypoglycemic drugs: Secondary | ICD-10-CM

## 2022-01-08 DIAGNOSIS — E1169 Type 2 diabetes mellitus with other specified complication: Secondary | ICD-10-CM | POA: Diagnosis present

## 2022-01-08 DIAGNOSIS — E2740 Unspecified adrenocortical insufficiency: Secondary | ICD-10-CM | POA: Diagnosis present

## 2022-01-08 DIAGNOSIS — N390 Urinary tract infection, site not specified: Secondary | ICD-10-CM | POA: Diagnosis present

## 2022-01-08 DIAGNOSIS — Z8619 Personal history of other infectious and parasitic diseases: Secondary | ICD-10-CM

## 2022-01-08 DIAGNOSIS — K7030 Alcoholic cirrhosis of liver without ascites: Secondary | ICD-10-CM | POA: Diagnosis not present

## 2022-01-08 DIAGNOSIS — Z794 Long term (current) use of insulin: Secondary | ICD-10-CM

## 2022-01-08 DIAGNOSIS — I872 Venous insufficiency (chronic) (peripheral): Secondary | ICD-10-CM | POA: Diagnosis present

## 2022-01-08 DIAGNOSIS — D75839 Thrombocytosis, unspecified: Secondary | ICD-10-CM | POA: Diagnosis present

## 2022-01-08 DIAGNOSIS — R748 Abnormal levels of other serum enzymes: Secondary | ICD-10-CM | POA: Diagnosis present

## 2022-01-08 DIAGNOSIS — E7800 Pure hypercholesterolemia, unspecified: Secondary | ICD-10-CM | POA: Diagnosis present

## 2022-01-08 DIAGNOSIS — Z8711 Personal history of peptic ulcer disease: Secondary | ICD-10-CM

## 2022-01-08 DIAGNOSIS — D631 Anemia in chronic kidney disease: Secondary | ICD-10-CM | POA: Diagnosis present

## 2022-01-08 DIAGNOSIS — Z833 Family history of diabetes mellitus: Secondary | ICD-10-CM

## 2022-01-08 DIAGNOSIS — R600 Localized edema: Secondary | ICD-10-CM | POA: Diagnosis present

## 2022-01-08 DIAGNOSIS — Z8719 Personal history of other diseases of the digestive system: Secondary | ICD-10-CM

## 2022-01-08 DIAGNOSIS — W19XXXD Unspecified fall, subsequent encounter: Secondary | ICD-10-CM | POA: Diagnosis not present

## 2022-01-08 LAB — CBC WITH DIFFERENTIAL/PLATELET
Abs Immature Granulocytes: 0.05 10*3/uL (ref 0.00–0.07)
Basophils Absolute: 0.1 10*3/uL (ref 0.0–0.1)
Basophils Relative: 1 %
Eosinophils Absolute: 0.2 10*3/uL (ref 0.0–0.5)
Eosinophils Relative: 3 %
HCT: 29.4 % — ABNORMAL LOW (ref 39.0–52.0)
Hemoglobin: 9.3 g/dL — ABNORMAL LOW (ref 13.0–17.0)
Immature Granulocytes: 1 %
Lymphocytes Relative: 25 %
Lymphs Abs: 2 10*3/uL (ref 0.7–4.0)
MCH: 26.3 pg (ref 26.0–34.0)
MCHC: 31.6 g/dL (ref 30.0–36.0)
MCV: 83.1 fL (ref 80.0–100.0)
Monocytes Absolute: 0.6 10*3/uL (ref 0.1–1.0)
Monocytes Relative: 7 %
Neutro Abs: 5.1 10*3/uL (ref 1.7–7.7)
Neutrophils Relative %: 63 %
Platelets: 489 10*3/uL — ABNORMAL HIGH (ref 150–400)
RBC: 3.54 MIL/uL — ABNORMAL LOW (ref 4.22–5.81)
RDW: 27.1 % — ABNORMAL HIGH (ref 11.5–15.5)
WBC: 7.9 10*3/uL (ref 4.0–10.5)
nRBC: 0 % (ref 0.0–0.2)

## 2022-01-08 LAB — COMPREHENSIVE METABOLIC PANEL
ALT: 59 U/L — ABNORMAL HIGH (ref 0–44)
AST: 53 U/L — ABNORMAL HIGH (ref 15–41)
Albumin: <1.5 g/dL — ABNORMAL LOW (ref 3.5–5.0)
Alkaline Phosphatase: 233 U/L — ABNORMAL HIGH (ref 38–126)
Anion gap: 9 (ref 5–15)
BUN: 19 mg/dL (ref 6–20)
CO2: 20 mmol/L — ABNORMAL LOW (ref 22–32)
Calcium: 7.5 mg/dL — ABNORMAL LOW (ref 8.9–10.3)
Chloride: 110 mmol/L (ref 98–111)
Creatinine, Ser: 1.55 mg/dL — ABNORMAL HIGH (ref 0.61–1.24)
GFR, Estimated: 52 mL/min — ABNORMAL LOW (ref >60)
Glucose, Bld: 113 mg/dL — ABNORMAL HIGH (ref 70–99)
Potassium: 4.6 mmol/L (ref 3.5–5.1)
Sodium: 139 mmol/L (ref 135–145)
Total Bilirubin: 0.4 mg/dL (ref 0.3–1.2)
Total Protein: 5.3 g/dL — ABNORMAL LOW (ref 6.5–8.1)

## 2022-01-08 LAB — CK: Total CK: 931 U/L — ABNORMAL HIGH (ref 49–397)

## 2022-01-08 LAB — ETHANOL: Alcohol, Ethyl (B): 133 mg/dL — ABNORMAL HIGH (ref <10)

## 2022-01-08 LAB — CBG MONITORING, ED: Glucose-Capillary: 126 mg/dL — ABNORMAL HIGH (ref 70–99)

## 2022-01-08 LAB — AMMONIA: Ammonia: 35 umol/L (ref 9–35)

## 2022-01-08 NOTE — ED Triage Notes (Signed)
Pt bib [facility] from home. Pt had fallen about 18-20 hours ago possibly had a seizure. Pt has been lying in his own fecal mater. Pt drank a couple cases of beer prior to episode. Pt rarely gets out of bed, and family does not check on pt. Reporting pain to left side of head. VSS.  ? ?EMS vitals ?160/80 BP ?160 CBG ?100% O2 ? 80 HR ? ?

## 2022-01-08 NOTE — ED Provider Notes (Signed)
?[facility] ?Provider Note ? ? ?CSN: [id number] ?Arrival date & time: 01/08/22  1944 ? ?  ? ?History ? ?Chief Complaint  ?Patient presents with  ? Failure To Thrive  ? Seizures  ? ? ?Jeffrey [name] is a 56 y.o. male. ? ? ?Seizures ?Patient presents with weakness and fall.  Reportedly had a fall 18 to 24 hours ago.  Thinks he may have had a seizure.  History of previous seizures and is on Keppra.  States has been compliant with medicine.  States he would normally not be able to get up after he fell.  Lives alone.  Was reportedly covered in stool.  Has been drinking alcohol.  Complaining of pain in his head.  States he hit his head when he went down.  No neck pain. ?  ?Past Medical History:  ?Diagnosis Date  ? Acute renal insufficiency 12/08/2012  ? AKI (acute kidney injury) ([place])   ? ALCOHOL ABUSE, HX OF 11/10/2007  ? Anoxic encephalopathy ([place])   ? Anxiety   ? Bleeding ulcer 2014  ? Blood transfusion 2014  ? "related to bleeding ulcer"  ? Cardiac arrest ([place]) 12/07/2012  ? Anoxic encephalopathy  ? GERD (gastroesophageal reflux disease)   ? High cholesterol   ? Hypertension   ? OSA on CPAP 12/07/2012  ? Seizure-like activity ([place])   ? Seizures ([place])   ? last seizure 01/05/2021  ? Type II diabetes mellitus ([place])   ? Venous insufficiency 12/13/2011  ? ? ?Home Medications ?Prior to Admission medications   ?Medication Sig Start Date End Date Taking? Authorizing Provider  ?blood glucose meter kit and supplies Dispense based on patient and insurance preference. Use up to four times daily as directed. (FOR ICD-10 E10.9, E11.9). dispense 90 test strips and 90 lancets. 09/17/21   Samuella Cota, MD  ?calcium carbonate (OS-CAL - DOSED IN MG OF ELEMENTAL CALCIUM) 1250 (500 Ca) MG tablet Take 1 tablet (500 mg of elemental calcium total) by mouth daily with breakfast. 01/01/22 01/31/22  Carollee Leitz, MD  ?Vitamin D3 (VITAMIN D) 25 MCG tablet Take 1 tablet (1,000 Units total) by mouth daily.  01/01/22   Carollee Leitz, MD  ?Continuous Blood Gluc Sensor (FREESTYLE LIBRE 2 SENSOR) MISC Use as directed ?Patient not taking: Reported on 12/20/2021 10/02/21   [name], MD  ?Dulaglutide (TRULICITY) 2.68 TM/1.9QQ SOPN Inject 0.75 mg into the skin once a week. ?Patient taking differently: Inject 0.75 mg into the skin every Wednesday. 11/29/21   Leavy Cella, RPH-CPP  ?DULoxetine (CYMBALTA) 30 MG capsule Take 1 capsule (30 mg total) by mouth daily. ?Patient taking differently: Take 30 mg by mouth at bedtime. 11/22/21   Zenia Resides, MD  ?Ensure Max Protein (ENSURE MAX PROTEIN) LIQD Take 330 mLs (11 oz total) by mouth 3 (three) times daily between meals. 08/10/21   Holley Bouche, MD  ?ferrous sulfate 325 (65 FE) MG tablet Take 1 tablet (325 mg total) by mouth every other day. 01/02/22   Carollee Leitz, MD  ?folic acid (FOLVITE) 1 MG tablet Take 1 tablet by mouth once daily ?Patient taking differently: Take 1 mg by mouth at bedtime. 10/02/21   [name], MD  ?gabapentin (NEURONTIN) 300 MG capsule Take 2 capsules (600 mg total) by mouth 3 (three) times daily. 12/31/21 01/30/22  Carollee Leitz, MD  ?hydrocerin (EUCERIN) CREA Apply 1 application. topically daily. 01/01/22   Carollee Leitz, MD  ?levETIRAcetam (KEPPRA) 1000 MG tablet Take 1 tablet (1,000  mg total) by mouth 2 (two) times daily. 09/17/21   Samuella Cota, MD  ?metFORMIN (GLUCOPHAGE-XR) 500 MG 24 hr tablet TAKE 1 TABLET BY MOUTH TWICE DAILY WITH A MEAL ?Patient taking differently: Take 500 mg by mouth in the morning and at bedtime. 12/04/21   [name], MD  ?Multiple Vitamin (MULTIVITAMIN WITH MINERALS) TABS tablet Take 1 tablet by mouth daily. ?Patient taking differently: Take 1 tablet by mouth daily with breakfast. 08/11/21   Holley Bouche, MD  ?pantoprazole (PROTONIX) 40 MG tablet Take 1 tablet (40 mg total) by mouth 2 (two) times daily. ?Patient taking differently: Take 40 mg by mouth at bedtime. 09/17/21 12/27/21  Samuella Cota,  MD  ?thiamine (VITAMIN B-1) 100 MG tablet Take 1 tablet (100 mg total) by mouth daily. ?Patient taking differently: Take 100 mg by mouth at bedtime. 05/27/16   [name], DO  ?   ? ?Allergies    ?Atorvastatin, Drug ingredient [spironolactone], Lisinopril, and Losartan   ? ?Review of Systems   ?Review of Systems  ?Respiratory:  Negative for shortness of breath.   ?Gastrointestinal:  Negative for abdominal pain.  ?Musculoskeletal:  Negative for back pain.  ?Neurological:  Positive for seizures and weakness.  ? ?Physical Exam ?Updated Vital Signs ?BP 119/76   Pulse 83   Temp (!) 93.5 ?F (34.2 ?C) (Rectal)   Resp 13   SpO2 98%  ?Physical Exam ?Vitals and nursing note reviewed.  ?HENT:  ?   Head:  ?   Comments: some swelling of the left face.  Has some tenderness over mandible and maxilla.  Eye movements intact. ?Eyes:  ?   Pupils: Pupils are equal, round, and reactive to light.  ?Cardiovascular:  ?   Rate and Rhythm: Normal rate.  ?Pulmonary:  ?   Breath sounds: No wheezing.  ?Abdominal:  ?   Tenderness: There is no abdominal tenderness.  ?Musculoskeletal:  ?   Cervical back: Neck supple.  ?   Right lower leg: Edema present.  ?   Left lower leg: Edema present.  ?Skin: ?   General: Skin is warm.  ?   Capillary Refill: Capillary refill takes less than 2 seconds.  ?Neurological:  ?   Mental Status: He is alert.  ?   Comments: Awake and appropriate.  ? ? ?ED Results / Procedures / Treatments   ?Labs ?(all labs ordered are listed, but only abnormal results are displayed) ?Labs Reviewed  ?COMPREHENSIVE METABOLIC PANEL - Abnormal; Notable for the following components:  ?    Result Value  ? CO2 20 (*)   ? Glucose, Bld 113 (*)   ? Creatinine, Ser 1.55 (*)   ? Calcium 7.5 (*)   ? Total Protein 5.3 (*)   ? Albumin <1.5 (*)   ? AST 53 (*)   ? ALT 59 (*)   ? Alkaline Phosphatase 233 (*)   ? GFR, Estimated 52 (*)   ? All other components within normal limits  ?ETHANOL - Abnormal; Notable for the following components:  ?  Alcohol, Ethyl (B) 133 (*)   ? All other components within normal limits  ?CK - Abnormal; Notable for the following components:  ? Total CK 931 (*)   ? All other components within normal limits  ?AMMONIA  ?RAPID URINE DRUG SCREEN, HOSP PERFORMED  ?URINALYSIS, ROUTINE W REFLEX MICROSCOPIC  ?CBC WITH DIFFERENTIAL/PLATELET  ?LEVETIRACETAM LEVEL  ?CBC WITH DIFFERENTIAL/PLATELET  ? ? ?EKG ?EKG Interpretation ? ?Date/Time:  Tuesday January 08 2022  19:49:28 EDT ?Ventricular Rate:  87 ?PR Interval:  158 ?QRS Duration: 98 ?QT Interval:  453 ?QTC Calculation: 545 ?R Axis:   49 ?Text Interpretation: Sinus rhythm Abnormal T, consider ischemia, diffuse leads Prolonged QT interval Confirmed by Davonna Belling [phone]) on 01/08/2022 7:52:04 PM ? ?Radiology ?CT HEAD WO CONTRAST (5MM) ? ?Result Date: 01/08/2022 ?CLINICAL DATA:  Unwitnessed fall. EXAM: CT HEAD WITHOUT CONTRAST CT MAXILLOFACIAL WITHOUT CONTRAST TECHNIQUE: Multidetector CT imaging of the head and maxillofacial structures were performed using the standard protocol without intravenous contrast. Multiplanar CT image reconstructions of the maxillofacial structures were also generated. RADIATION DOSE REDUCTION: This exam was performed according to the departmental dose-optimization program which includes automated exposure control, adjustment of the mA and/or kV according to patient size and/or use of iterative reconstruction technique. COMPARISON:  January 01, 2022 FINDINGS: CT HEAD FINDINGS Brain: No evidence of acute infarction, hemorrhage, hydrocephalus, extra-axial collection or mass lesion/mass effect. Vascular: No hyperdense vessel or unexpected calcification. Skull: Normal. Negative for fracture or focal lesion. Other: None. CT MAXILLOFACIAL FINDINGS Osseous: No fracture or mandibular dislocation. No destructive process. Orbits: Negative. No traumatic or inflammatory finding. Sinuses: Clear. Soft tissues: There appears to be a left periorbital soft tissue contusion as well  as swelling overlying the left mandibular and maxillary regions consistent with contusion. IMPRESSION: No acute intracranial abnormality seen. No fracture is noted in the maxillofacial region. Left perior

## 2022-01-08 NOTE — ED Notes (Signed)
Pt given full bed bath and linen changed. Warm blankets provided.  ?

## 2022-01-08 NOTE — H&P (Addendum)
Family Medicine Teaching Service ?Hospital Admission History and Physical ?Service Pager: [phone] ? ?Patient name: LEXIE MORINI Medical record number: [id number] ?Date of birth: [birth year] Age: 56 y.o. Gender: male ? ?Primary Care Provider: [name], MD ?Consultants: None ?Code Status: Full   ?Preferred Emergency Contact: Evorn Gong (friend) [phone] ? ?Chief Complaint: Fall, potential seizure like activity  ? ?Assessment and Plan: ?RAYNE LOISEAU is a 56 y.o. male presenting with fall . PMH is significant for a seizure disorder, HTN, DM2, hx of ETOH abuse, Depression, HLD, GERD ? ?Falls  Generalized weakness  Seizure disorder ?Patient presents after an unwitnessed fall 2/2 to self reported seizure like activity, with history of EtOH abuse and seizure disorder. Patient presents to ED with generalized weakness. Patient was hypothermic to 93.5 (rectal temp) with BP 165/103, pulse 82, sat 100%. His labs were significant for an Ethanol level of 133, Cr 1.55, Ca 7.5, T. Prot 5.3, Albumin < 1.5, AST 53, Alt 59, Alk Phos 233, GFR 52, CK 931, and Hgb 9.3. CT Head and maxillofacial were negative for acute intracranial abnormality, but did show left periorbital soft tissue contusion with probable contusions overlying the left mandibular and maxillary regions. CXR was negative for active disease. Differential considers seizure, giving patient seizure disorder and recent episode of drinking. Patient glc and electrolytes WNL, making hypoglycemia or electrolyte derangement less likely the cause. Patient reports taking AED's daily, making medication non-compliance less likely etiology of event. Patient reportedly drank a case of beer overnight with a seizure happening hours later. Patient has elevated EtOH level on admission, making ETOH withdrawal concerning but less likely. Patient could also have suffered a syncopal event, and repots a history of orthostatic blood pressure problems from his  antihypertensive medications,which were recently stopped, making this cause less likely. Patient discharged 12/31/21 with notable abnormal ACTH stim test, suggesting secondary/tertiary adrenal insufficiency, possibly causing falls. Patient fall/seizure like activity is likely 2/2 multifactorial causes, that may include his recent EtOH use.   ?-Admit to FPTS, attending Dr. Thompson Grayer ?-Continuous Cardiac monitoring  ?-consider echo ?-PT/OT eval and treat ?-Seizure precautions ?-Up with assistance ?-Vitals per unit ?-Q4h Neuro checks ?-Continue Keppra 1000 mg BID ?-Orthostatic Vitals ?-UDS ?-AM CBC, CMP, TSH, B12, folate  ?-Consider Neuro consult ? ?Hypothermia ?Patient presented with rectal temperature 93.5 and was placed on beir hugger. Etiology of hypothermia unknown. ?-Continue to monitor ? ?Elevated CK ?Patient presented with CK of 931, concern for rhabdomyolysis after possible seizure activity. Patient has history of elevated CK 3 months ago, as high as 2,868. Patient reported to have fallen to floor after seizure like event, and was unable to get himself off the floor. He reported sleeping on the floor 12 or more hours after fall. ?-considered fluids but will hold off for now given generalized edema  ?-consider repeat CK to trend  ? ?Anasarca? ?Patient presents with pitting edema in his extremities. Pitting goes up to knee in legs and just passed wrist in his hands. Patient reports 2 months of edema. Patient had Echo cardiogram 12/25/21 showing EF 65-70% with grade 1 diastolic dysfunction.  ?-Consider repeat Echo ?-Considered lasix but will hold off given AKI ?-Strict I/O ?-Daily weights ? ?Hypocalcemia ?Patient admitted with Ca of 7.5 that corrected to 9.5. Will continue to monitor.  ?-am CMP ? ?HTN ?Patient presents with BP of 165/103. Patient with history of HTN treated with medications, but those were held at last hospitalization 2/2 orthostatic BP, and falls. ?-Continue to monitor ?-may consider adding ARB  if  worsens and AKI improves ? ?Transaminitis ?Patient presents with elevated liver enzymes. AST 53, ALT 59. Transaminitis does not follow typical alcohol induced hepatocellular injury pattern of AST 2 x ALT to be expected with recent hx of EtOh abuse. Will continue to monitor. ?-AM CMP ? ?AKI ?Patient presents with Cr. 1.55, with baseline 1.16-1.2. Etiology likely pre-renal in setting of poor PO status, patient reports not eating while at home. Will continue to monitor ?-AM CMP ?-encourage oral intake ?-considered fluids but held off given anasarca  ? ?Normocytic Anemia ?Patient presents with Hgb of 9.3 and MCV 83.1. Patient with history of Anemia of Chronic disease and hgb persistently in 7.0-9.0 range, patient received 3 units of PRBC during last hospitalization. Per chart review, takes iron supplementation at home. Will continue to monitor ?-hold home iron supplementation until med rec complete  ?-AM CBC ? ?DM2  Neuropathy ?Patient taking Trulicity 8.93 mg weekly, and metformin 500 mg BID. Patient takes gabapentin 300 mg TID for neuropathy. Last A1c 11/19/21 and was 7.8. ?-mSSI, may adjust appropriately as needed  ?-CBGs with meals at bedtime ?-Continue gabapentin ?-Hold metformin ?-regular diet per patient request as he states he was on this during the last hospitalization but explained to him that if CBGs are not within appropriate range then will need to switch to carb modified diet  ? ?Hx of ETOH abuse ?Patient reports extensive history of alcohol abuse. Patient reports drinking a case of 12 oz beer overnight, recently.  ?-Continue thiamine 100 mg daily ?-Continue Folate 1 mg daily ?-CIWAs ?-consider TOC consult  ? ?Depression ?Patient takes Cymbalta 30 mg and Remeron 30 mg daily ?-Continue Cymbalta ?-Hold Remeron until med rec complete  ? ?GERD ?Not endorsing epigastric pain or reflux symptoms.  ?-continue Protonix 40 mg daily ? ?HLD ?LDL at goal of 58 on 02/27/21. Should be on statin therapy but does not  tolerate statins and do not want to start during admission given elevated CK and elevated transaminases.  ?-lifestyle modifications  ? ?Nutrition Status ?Patient with history of EtOH abuse and recent EtOH abuse. Patient also reported that he doesn't eat every day. Given patient history of substance use and poor diet along with albumin <1.5, will recommend RD consult.   ?-RD consult, appreciate recs  ? ? ?FEN/GI: Regular Diet ?Prophylaxis: Lovenox ? ?Disposition: Admit to med-tele ? ?History of Present Illness:  SHYHEIM TANNEY is a 56 y.o. male presenting with multiple falls and possible seizure-like activity. Patient states that he felt a seizure come on, he can usually feel when he is going to seize. He had nothing to grab on to stop him from falling down, he says that once he falls he usually needs help to get up. His kids are at home but are very small so cannot help him get up. He usually urinates or defecates on himself which is what happens. He was able to clean himself up and stayed on the floor. When he woke up, he called EMS. But what he is concerned about is he has been falling down a lot lately, similar to his last admission. He says he has been falling out almost everyday when he walks. EMS checked his blood pressure when he stood up but says that these falls do not occur when he stands up, they occur when he walks. He says that he is sick and tired of his repetitive falls. He says that his falls have gotten so much more frequent so he does not walk,  if he falls it is hard for him to get up so calls the fire station to help him. Last time he felt this way was February of last year. He says he went to his PCP who took him off of he BP meds which improved. During these episodes, he endorses lightheadedness and dizziness. He has the feeling that he is "going down" before falling. Endorses significant alcohol use since the age of 11 years old, wife passed away last year and he stopped alcohol use before  starting again. Last night one of his good friend's invited him to watch the basketball game, he drank beer last night. He drank about a case of 12 ounce cans last night. When he came home, he was walking through the house and t

## 2022-01-08 NOTE — ED Notes (Signed)
RN unsuccessful at starting another IV.  ?

## 2022-01-08 NOTE — ED Notes (Signed)
Pt rectal temp 93.5. MD aware. Beir hugger placed on pt.  ?

## 2022-01-09 ENCOUNTER — Other Ambulatory Visit (HOSPITAL_COMMUNITY): Payer: Managed Care, Other (non HMO)

## 2022-01-09 ENCOUNTER — Encounter (HOSPITAL_COMMUNITY): Payer: Self-pay | Admitting: Family Medicine

## 2022-01-09 DIAGNOSIS — R748 Abnormal levels of other serum enzymes: Secondary | ICD-10-CM | POA: Diagnosis not present

## 2022-01-09 DIAGNOSIS — R531 Weakness: Secondary | ICD-10-CM

## 2022-01-09 DIAGNOSIS — W19XXXD Unspecified fall, subsequent encounter: Secondary | ICD-10-CM

## 2022-01-09 DIAGNOSIS — Z789 Other specified health status: Secondary | ICD-10-CM

## 2022-01-09 DIAGNOSIS — R569 Unspecified convulsions: Secondary | ICD-10-CM

## 2022-01-09 LAB — COMPREHENSIVE METABOLIC PANEL
ALT: 46 U/L — ABNORMAL HIGH (ref 0–44)
AST: 40 U/L (ref 15–41)
Albumin: <1.5 g/dL — ABNORMAL LOW (ref 3.5–5.0)
Alkaline Phosphatase: 178 U/L — ABNORMAL HIGH (ref 38–126)
Anion gap: 9 (ref 5–15)
BUN: 18 mg/dL (ref 6–20)
CO2: 22 mmol/L (ref 22–32)
Calcium: 7.1 mg/dL — ABNORMAL LOW (ref 8.9–10.3)
Chloride: 109 mmol/L (ref 98–111)
Creatinine, Ser: 1.56 mg/dL — ABNORMAL HIGH (ref 0.61–1.24)
GFR, Estimated: 51 mL/min — ABNORMAL LOW (ref >60)
Glucose, Bld: 85 mg/dL (ref 70–99)
Potassium: 4.5 mmol/L (ref 3.5–5.1)
Sodium: 140 mmol/L (ref 135–145)
Total Bilirubin: 0.2 mg/dL — ABNORMAL LOW (ref 0.3–1.2)
Total Protein: 4.3 g/dL — ABNORMAL LOW (ref 6.5–8.1)

## 2022-01-09 LAB — RAPID URINE DRUG SCREEN, HOSP PERFORMED
Amphetamines: NOT DETECTED
Barbiturates: NOT DETECTED
Benzodiazepines: NOT DETECTED
Cocaine: NOT DETECTED
Opiates: NOT DETECTED
Tetrahydrocannabinol: NOT DETECTED

## 2022-01-09 LAB — CBC
HCT: 24.5 % — ABNORMAL LOW (ref 39.0–52.0)
Hemoglobin: 8 g/dL — ABNORMAL LOW (ref 13.0–17.0)
MCH: 26.2 pg (ref 26.0–34.0)
MCHC: 32.7 g/dL (ref 30.0–36.0)
MCV: 80.3 fL (ref 80.0–100.0)
Platelets: 426 10*3/uL — ABNORMAL HIGH (ref 150–400)
RBC: 3.05 MIL/uL — ABNORMAL LOW (ref 4.22–5.81)
RDW: 27.8 % — ABNORMAL HIGH (ref 11.5–15.5)
WBC: 6.3 10*3/uL (ref 4.0–10.5)
nRBC: 0 % (ref 0.0–0.2)

## 2022-01-09 LAB — URINALYSIS, ROUTINE W REFLEX MICROSCOPIC
Bilirubin Urine: NEGATIVE
Glucose, UA: NEGATIVE mg/dL
Ketones, ur: 5 mg/dL — AB
Nitrite: NEGATIVE
Protein, ur: 100 mg/dL — AB
RBC / HPF: >50 RBC/hpf — ABNORMAL HIGH (ref 0–5)
Specific Gravity, Urine: 1.011 (ref 1.005–1.030)
WBC, UA: >50 WBC/hpf — ABNORMAL HIGH (ref 0–5)
pH: 5 (ref 5.0–8.0)

## 2022-01-09 LAB — GLUCOSE, CAPILLARY
Glucose-Capillary: 144 mg/dL — ABNORMAL HIGH (ref 70–99)
Glucose-Capillary: 265 mg/dL — ABNORMAL HIGH (ref 70–99)
Glucose-Capillary: 191 mg/dL — ABNORMAL HIGH (ref 70–99)

## 2022-01-09 LAB — CBG MONITORING, ED: Glucose-Capillary: 76 mg/dL (ref 70–99)

## 2022-01-09 LAB — VITAMIN B12: Vitamin B-12: 1032 pg/mL — ABNORMAL HIGH (ref 180–914)

## 2022-01-09 LAB — MAGNESIUM: Magnesium: 1.2 mg/dL — ABNORMAL LOW (ref 1.7–2.4)

## 2022-01-09 LAB — FOLATE: Folate: 30.6 ng/mL (ref >5.9)

## 2022-01-09 LAB — TSH: TSH: 7.317 u[IU]/mL — ABNORMAL HIGH (ref 0.350–4.500)

## 2022-01-09 MED ORDER — MAGNESIUM SULFATE 4 GM/100ML IV SOLN
4.0000 g | Freq: Once | INTRAVENOUS | Status: AC
  Administered 2022-01-09: 4 g via INTRAVENOUS
  Filled 2022-01-09: qty 100

## 2022-01-09 MED ORDER — GABAPENTIN 300 MG PO CAPS
600.0000 mg | ORAL_CAPSULE | Freq: Three times a day (TID) | ORAL | Status: DC
Start: 2022-01-09 — End: 2022-01-17
  Administered 2022-01-09 – 2022-01-17 (×26): 600 mg via ORAL
  Filled 2022-01-09 (×27): qty 2

## 2022-01-09 MED ORDER — LACTATED RINGERS IV SOLN: INTRAVENOUS | Status: DC

## 2022-01-09 MED ORDER — SODIUM CHLORIDE 0.9 % IV BOLUS
1000.0000 mL | Freq: Once | INTRAVENOUS | Status: AC
  Administered 2022-01-09: 1000 mL via INTRAVENOUS

## 2022-01-09 MED ORDER — SODIUM CHLORIDE 0.9 % IV BOLUS
250.0000 mL | Freq: Once | INTRAVENOUS | Status: AC
  Administered 2022-01-09: 250 mL via INTRAVENOUS

## 2022-01-09 MED ORDER — PANTOPRAZOLE SODIUM 40 MG PO TBEC
40.0000 mg | DELAYED_RELEASE_TABLET | Freq: Every day | ORAL | Status: DC
  Administered 2022-01-09 – 2022-01-16 (×9): 40 mg via ORAL
  Filled 2022-01-09 (×9): qty 1

## 2022-01-09 MED ORDER — DULOXETINE HCL 30 MG PO CPEP
30.0000 mg | ORAL_CAPSULE | Freq: Every day | ORAL | Status: DC
  Administered 2022-01-09 – 2022-01-17 (×9): 30 mg via ORAL
  Filled 2022-01-09 (×9): qty 1

## 2022-01-09 MED ORDER — INSULIN ASPART 100 UNIT/ML IJ SOLN
0.0000 [IU] | Freq: Three times a day (TID) | INTRAMUSCULAR | Status: DC
  Administered 2022-01-09: 2 [IU] via SUBCUTANEOUS
  Administered 2022-01-09: 8 [IU] via SUBCUTANEOUS
  Administered 2022-01-10: 2 [IU] via SUBCUTANEOUS
  Administered 2022-01-10: 5 [IU] via SUBCUTANEOUS

## 2022-01-09 MED ORDER — FOLIC ACID 1 MG PO TABS
1.0000 mg | ORAL_TABLET | Freq: Every day | ORAL | Status: DC
  Administered 2022-01-09 – 2022-01-16 (×9): 1 mg via ORAL
  Filled 2022-01-09 (×9): qty 1

## 2022-01-09 MED ORDER — ENOXAPARIN SODIUM 60 MG/0.6ML IJ SOSY
50.0000 mg | PREFILLED_SYRINGE | INTRAMUSCULAR | Status: DC
  Administered 2022-01-09 – 2022-01-14 (×6): 50 mg via SUBCUTANEOUS
  Filled 2022-01-09 (×4): qty 0.6
  Filled 2022-01-09: qty 0.5
  Filled 2022-01-09: qty 0.6

## 2022-01-09 MED ORDER — LEVETIRACETAM 500 MG PO TABS
1000.0000 mg | ORAL_TABLET | Freq: Two times a day (BID) | ORAL | Status: DC
  Administered 2022-01-09 – 2022-01-17 (×18): 1000 mg via ORAL
  Filled 2022-01-09 (×19): qty 2

## 2022-01-09 MED ORDER — THIAMINE HCL 100 MG PO TABS
100.0000 mg | ORAL_TABLET | Freq: Every day | ORAL | Status: DC
  Administered 2022-01-09 – 2022-01-17 (×9): 100 mg via ORAL
  Filled 2022-01-09 (×9): qty 1

## 2022-01-09 NOTE — ED Notes (Signed)
Bair hugger removed. Rectal temp 99.7 ?

## 2022-01-09 NOTE — Hospital Course (Addendum)
Jeffrey [name] is a 56 y.o. male who was admitted to the [facility] Medicine Teaching Service at [facility] for fall, seizure, hypothermia. Hospital course is outlined below by system.  ? ?Falls  Generalized Weakness  Seizure Disorder ?Patient presented to [facility] ED on 4/4 after an unwitnessed fall secondary to a self-reported seizure at home after drinking 12 pack of beer.  He reports that he drank a 12 pack of beer prior to the onset of seizure activity.  Patient was hypothermic on arrival, which was quickly resolved with [facility].  Patient was closely monitored and had no recurrence of seizure activity, was continued on home seizure medication.  Likely precipitated secondary to decrease seizure threshold with alcohol use. Patient was evaluated by physical therapy with recommendations for outpatient follow-up. Patient will need to abstain from driving until seizure free for at least 6 months.  ? ?Orthostatic Hypotension  Hypertension ?Patient has history of orthostatic hypotension, that was present on admission. Patient BP wavered from hypotensive to hypertensive during admission. Due to risk of orthostatic hypotension, patient BP was allowed to be severely hypertensive, without symptoms. By discharge, patient's BP was appropriate.   ? ?Hyperkalemia ?Patient potassium was elevated beyond normal ranges, requiring treatment with lokelma, multiple times. Potassium was thought to be elevated 2/2 poorly managed blood sugars and possibly adrenal insufficiency (see below). Patient potassium was stable and 4.6 on day of discharge. ? ?Hypoglycemia ?Patient blood sugars poorly controlled during hospitalization. Patient had multiple episodes of hyperglycemia, attempted to correct with long acting insuline leading to episodes of hypoglycemia, and insulin discontinued.  ?Patient sugars were monitored and appropriate on day of discharge. Discharged on home regimen of trulicty and metformin.   ? ?Klebsiella Urinary tract  infection ?Patient initially not reporting symptoms on admission, but later reported symptoms have been present for weeks.  Patient treated with Duricef, for 7 days. Patient was asymptomatic by day of discharge. ? ?Lower extremity and BUE Edema  ?RUE doppler negative for DVT. Most likely due to cirrhosis and poor nutritional status. Gently diuresed and BUE edema improved. TED hoses were applied while inpatient to BLE. ? ?Orthostatic hypotension   ?Noted orthostatic vitals but will plan to treat patient's hypertension based on standing blood pressures as patient becomes significantly symptomatic with over treatment of blood pressure. ? ?Abnormal ACTH  Concern for Adrenal Insufficiency  ?Patient with abnormal ACTH stimulation test during last hospitalization, and was found to have abnormal ACTH stimulation test during this hospitalization. Result was concerning for adrenal insufficiency. Spoke with endocrinologist who recommended morning ACTH level & morning Renin/Aldosterone level, as well as glutamic acid decarboxylase (GAD65), to further determine adrenal insufficiency. Labs were collected during admission but had not resulted by discharge. Please follow up results.  ? ?Elevated CK ?Patient admitted with elevated CK following fall and prolonged stay on floor. Patient CK elevated to 931. Patient was treated with gentle hydration and CK had normalized by day of discharge.  ? ?All other chronic conditions were stable during hospitalization. ? ?Issues for follow-up ?Patient to follow up with Endocrine, Dr. Buddy Duty ?Follow up Adrenal Insufficiency labs ?Repeat BMP to monitor creatinine & K level ?Check CBG - ensure patient has supplies for CGM ?Consider repeat EKG to monitor Qtc ?Ensure patient is not driving to appointments, if driving with children in the car, may need to consider CPS report ?Finasteride started inpatient ?

## 2022-01-09 NOTE — Progress Notes (Addendum)
Family Medicine Teaching Service ?Daily Progress Note ?Intern Pager: [phone] ? ?Patient name: Jeffrey [name] Medical record number: [id number] ?Date of birth: [birth year] Age: 56 y.o. Gender: male ? ?Primary Care Provider: [name], MD ?Consultants: None ?Code Status: Full ? ?Pt Overview and Major Events to Date:  ?4/4- admitted ? ?Assessment and Plan: ?Jeffrey [name] is a 56 y.o. male presenting with fall, found to be hypothermic on admission. PMH is significant for a seizure disorder, HTN, DM2, hx of ETOH abuse, Depression, HLD, GERD ? ?Falls  Generalized Weakness  Seizure Disorder ?Vitals improved overnight. Temp up to 99.7, up from 93.5. HR intermittently tachycardic to low 100s. BP soft ON to 85/53, responded to a bolus and now 115/57. No episodes of further seizure activity overnight.  Magnesium 1.2 on a.m. labs.  TSH slightly elevated at 7.317, suspect subclinical.  UDS was negative. Suspect that ultimate cause of this episode was seizures that were precipitated in the setting of heavy etOH use (patient drank an entire 12-pack of beer).  Reassured that he reports an aura before onset of symptoms.  Cannot rule out cardiac event/arrhythmia, and he is certainly high risk. ?- Continue home Keppra and Gabapentin ?- PT/OT to see given hx of multiple recent falls ?- Continue seizure precautions ?- Orthostatic vitals ?- Replete magnesium ?-Telemetry ?-Will give a 1L bolus for pressure support ? ?Hypothermia, resolved ?Now off [facility]. Suspect 2/2 seizure. ?- Monitor vitals ? ?Prolonged QTc  ?QTc 545 on admission EKG.   ?-Replete to K >4, mag >2 ?-Daily EKGs ? ?Pitting edema to BL UE and LE ?Hypoalbuminemia ?Patient with pitting edema of uncertain etiology. Had an echo about two weeks ago with EF 65-70% and G1DD- unlikely to be the cause.  Patient reports that this has been going on for quite some time.  The most likely cause seems to be his hypoalbuminemia secondary to very poor nutritional status in  the setting of chronic alcohol abuse. Patient reports not eating every day, especially when drinking heavily. Albumin <1.5.  ?Some concern for nephrotic syndrome given proteinuria, hypoalbuminemia, and peripheral edema. Given history of etOH use and known cirrhosis, decompensation of his cirrhosis also remains on the differential but without abdominal distention or change in his liver enzyme profile, acute decompensation seems less likely. ?-Consult to registered dietitian for hypoalbuminemia ?-Could consider 24-hour urine protein if persistent concern for nephrosis ? ?History of abnormal ACTH Stim Test ?Patient had an abnormal ACTH stim test at his last admission in March.  With a rise in cortisol but to <18 micrograms per deciliter 6.9>14.8>16.4).  Plasma renin activity and aldosterone levels were both within normal limits.  Suboptimal ACTH response raises some concern for secondary adrenal insufficiency.  Brain imaging for possible pituitary disease was considered by not ordered.  Given that seizures to the most likely cause of his symptoms at this time and not hypotension secondary to adrenal insufficiency, do not see an indication to pursue this at this time.   ?- This should be followed up as an outpatient ?-If persistently hypotensive or orthostatic, can trial empiric fludrocortisone ? ?Elevated CK ?CK as high as 931 on admission in the setting of fall and being on the ground 12 hours. No pain to suggest acute rhabdo and elevated CK could be explained simply by seizure activity, though would want to ensure resolution/improvement. ?- Repeat CK with a.m. labs to ensure downtrending. ? ?Hx of EtOH Abuse  Transaminitis ?AST 53>40 ALT 59>46. CIWA 2 overnight.  ?- Continue  thiamine '100mg'$  daily ?- Folate '1mg'$  daily ?- CIWAs ?- TOC consult ? ?CKD IIIa ?Cr 1.55>1.56, up from baseline 1.0-1.2. ?- 1L fluid bolus as above ?- Follow on AM labs ? ?Normocytic anemia ?Hgb 9.3>8.0. Patient has known anemia of chronic disease  and this is consistent with his baseline of 7-9. Will monitor. Has a history of requiring multiple units of blood last hospitalization.  ?- AM CBC ? ?DM2 ?Glucose overnight 113-126.  ?- CBG, sSSI ?- Continue home gabapentin for neuropathy ? ?MDD ?- Continue home Cymbalta ?- Restart home mirtazapine ? ?Chronic stable conditions ?GERD- protonix daily ? ?FEN/GI: Regular diet ?PPx: Lovenox ?Dispo: Pending work-up for falls/seizures   ? ?Subjective:  ?Jeffrey [name] reports feeling "good" today.  He continues to express concerns that he is weaker than usual and that he has had several falls in the last couple of months.  He would like to know why this is.  He does not think that he had seizures with any of the previous falls so thinks that "this might be different." ? ?Objective: ?Temp:  [93.5 ?F (34.2 ?C)-99.7 ?F (37.6 ?C)] 99.7 ?F (37.6 ?C) (04/05 0516) ?Pulse Rate:  [80-107] 107 (04/05 0545) ?Resp:  [10-22] 20 (04/05 0545) ?BP: (84-178)/(53-114) 115/57 (04/05 0545) ?SpO2:  [92 %-100 %] 92 % (04/05 0545) ?Physical Exam: ?General: Sleeping on arrival, awakens easily to voice, NAD ?HEENT: Swelling and tenderness over left face ?Cardiovascular: Regular rate, regular rhythm, without murmur ?Respiratory: Normal work of breathing on room air, lungs clear ?Abdomen: Obese but without distention or fluid wave, nontender ?Extremities: Pitting edema to thighs bilaterally, 1+ pitting edema to bilateral hands as well ? ?Laboratory: ?Recent Labs  ?Lab 01/08/22 ?2305 01/09/22 ?4196  ?WBC 7.9 6.3  ?HGB 9.3* 8.0*  ?HCT 29.4* 24.5*  ?PLT 489* 426*  ? ?Recent Labs  ?Lab 01/08/22 ?2000 01/09/22 ?2229  ?NA 139 140  ?K 4.6 4.5  ?CL 110 109  ?CO2 20* 22  ?BUN 19 18  ?CREATININE 1.55* 1.56*  ?CALCIUM 7.5* 7.1*  ?PROT 5.3* 4.3*  ?BILITOT 0.4 0.2*  ?ALKPHOS 233* 178*  ?ALT 59* 46*  ?AST 53* 40  ?GLUCOSE 113* 85  ? ? ?Imaging/Diagnostic Tests: ?DG Chest Portable 1 View ?CLINICAL DATA:  Altered mental status ? ?EXAM: ?PORTABLE CHEST 1  VIEW ? ?COMPARISON:  12/20/2021 ? ?FINDINGS: ?Heart and mediastinal contours are within normal limits. No focal ?opacities or effusions. No acute bony abnormality. ? ?IMPRESSION: ?No active disease. ? ?Electronically Signed ?  By: Rolm Baptise M.D. ?  On: 01/08/2022 21:42 ?CT Maxillofacial Wo Contrast ?CLINICAL DATA:  Unwitnessed fall. ? ?EXAM: ?CT HEAD WITHOUT CONTRAST ? ?CT MAXILLOFACIAL WITHOUT CONTRAST ? ?TECHNIQUE: ?Multidetector CT imaging of the head and maxillofacial structures ?were performed using the standard protocol without intravenous ?contrast. Multiplanar CT image reconstructions of the maxillofacial ?structures were also generated. ? ?RADIATION DOSE REDUCTION: This exam was performed according to the ?departmental dose-optimization program which includes automated ?exposure control, adjustment of the mA and/or kV according to ?patient size and/or use of iterative reconstruction technique. ? ?COMPARISON:  January 01, 2022 ? ?FINDINGS: ?CT HEAD FINDINGS ? ?Brain: No evidence of acute infarction, hemorrhage, hydrocephalus, ?extra-axial collection or mass lesion/mass effect. ? ?Vascular: No hyperdense vessel or unexpected calcification. ? ?Skull: Normal. Negative for fracture or focal lesion. ? ?Other: None. ? ?CT MAXILLOFACIAL FINDINGS ? ?Osseous: No fracture or mandibular dislocation. No destructive ?process. ? ?Orbits: Negative. No traumatic or inflammatory finding. ? ?Sinuses: Clear. ? ?Soft tissues: There appears to  be a left periorbital soft tissue ?contusion as well as swelling overlying the left mandibular and ?maxillary regions consistent with contusion. ? ?IMPRESSION: ?No acute intracranial abnormality seen. ? ?No fracture is noted in the maxillofacial region. Left periorbital ?soft tissue contusion is noted as well as probable contusions ?overlying the left mandibular and maxillary regions. ? ?Electronically Signed ?  By: Marijo Conception M.D. ?  On: 01/08/2022 20:48 ?CT HEAD WO CONTRAST  (5MM) ?CLINICAL DATA:  Unwitnessed fall. ? ?EXAM: ?CT HEAD WITHOUT CONTRAST ? ?CT MAXILLOFACIAL WITHOUT CONTRAST ? ?TECHNIQUE: ?Multidetector CT imaging of the head and maxillofacial structures ?were performed using the stand

## 2022-01-09 NOTE — ED Notes (Signed)
Pt BP a little soft - MD made aware - Pt is resting at this time and appears in no acute distress - Awakens to answer questions appropriately  ?

## 2022-01-09 NOTE — ED Notes (Addendum)
Admitting paged about pts BP becoming soft and dropping into the 80's SBP. Pt  A&Ox4 and able to stay awake  ?

## 2022-01-09 NOTE — Plan of Care (Signed)
  Problem: Nutrition: Goal: Adequate nutrition will be maintained Outcome: Progressing   

## 2022-01-09 NOTE — ED Notes (Signed)
While this RN was in MRI with a pt, second RN notified this RN that pt BP's were low - rechecked multiple times - Pt was mentating fine - Resident returned page and stated we were going to give a small bolus  ?

## 2022-01-10 ENCOUNTER — Inpatient Hospital Stay (HOSPITAL_COMMUNITY): Payer: Commercial Managed Care - HMO

## 2022-01-10 ENCOUNTER — Inpatient Hospital Stay (HOSPITAL_COMMUNITY): Payer: Managed Care, Other (non HMO)

## 2022-01-10 ENCOUNTER — Other Ambulatory Visit (HOSPITAL_COMMUNITY): Payer: Managed Care, Other (non HMO)

## 2022-01-10 DIAGNOSIS — R748 Abnormal levels of other serum enzymes: Secondary | ICD-10-CM | POA: Diagnosis not present

## 2022-01-10 DIAGNOSIS — R569 Unspecified convulsions: Secondary | ICD-10-CM | POA: Diagnosis not present

## 2022-01-10 DIAGNOSIS — M79601 Pain in right arm: Secondary | ICD-10-CM

## 2022-01-10 DIAGNOSIS — W19XXXD Unspecified fall, subsequent encounter: Secondary | ICD-10-CM | POA: Diagnosis not present

## 2022-01-10 DIAGNOSIS — Z789 Other specified health status: Secondary | ICD-10-CM | POA: Diagnosis not present

## 2022-01-10 LAB — COMPREHENSIVE METABOLIC PANEL
ALT: 62 U/L — ABNORMAL HIGH (ref 0–44)
AST: 56 U/L — ABNORMAL HIGH (ref 15–41)
Albumin: <1.5 g/dL — ABNORMAL LOW (ref 3.5–5.0)
Alkaline Phosphatase: 286 U/L — ABNORMAL HIGH (ref 38–126)
Anion gap: 8 (ref 5–15)
BUN: 18 mg/dL (ref 6–20)
CO2: 23 mmol/L (ref 22–32)
Calcium: 7.6 mg/dL — ABNORMAL LOW (ref 8.9–10.3)
Chloride: 109 mmol/L (ref 98–111)
Creatinine, Ser: 1.8 mg/dL — ABNORMAL HIGH (ref 0.61–1.24)
GFR, Estimated: 43 mL/min — ABNORMAL LOW (ref >60)
Glucose, Bld: 123 mg/dL — ABNORMAL HIGH (ref 70–99)
Potassium: 4.6 mmol/L (ref 3.5–5.1)
Sodium: 140 mmol/L (ref 135–145)
Total Bilirubin: 0.4 mg/dL (ref 0.3–1.2)
Total Protein: 6.3 g/dL — ABNORMAL LOW (ref 6.5–8.1)

## 2022-01-10 LAB — CBC
HCT: 35 % — ABNORMAL LOW (ref 39.0–52.0)
Hemoglobin: 11.3 g/dL — ABNORMAL LOW (ref 13.0–17.0)
MCH: 26.7 pg (ref 26.0–34.0)
MCHC: 32.3 g/dL (ref 30.0–36.0)
MCV: 82.5 fL (ref 80.0–100.0)
Platelets: 532 10*3/uL — ABNORMAL HIGH (ref 150–400)
RBC: 4.24 MIL/uL (ref 4.22–5.81)
RDW: 28.1 % — ABNORMAL HIGH (ref 11.5–15.5)
WBC: 6.8 10*3/uL (ref 4.0–10.5)
nRBC: 0 % (ref 0.0–0.2)

## 2022-01-10 LAB — MAGNESIUM: Magnesium: 1.8 mg/dL (ref 1.7–2.4)

## 2022-01-10 LAB — GLUCOSE, CAPILLARY
Glucose-Capillary: 133 mg/dL — ABNORMAL HIGH (ref 70–99)
Glucose-Capillary: 209 mg/dL — ABNORMAL HIGH (ref 70–99)
Glucose-Capillary: 46 mg/dL — ABNORMAL LOW (ref 70–99)
Glucose-Capillary: 51 mg/dL — ABNORMAL LOW (ref 70–99)
Glucose-Capillary: 71 mg/dL (ref 70–99)
Glucose-Capillary: 103 mg/dL — ABNORMAL HIGH (ref 70–99)

## 2022-01-10 LAB — LEVETIRACETAM LEVEL: Levetiracetam Lvl: 19.9 ug/mL (ref 10.0–40.0)

## 2022-01-10 LAB — PROTEIN / CREATININE RATIO, URINE
Creatinine, Urine: 68.49 mg/dL
Protein Creatinine Ratio: 1.61 mg/mg{Cre} — ABNORMAL HIGH (ref 0.00–0.15)
Total Protein, Urine: 110 mg/dL

## 2022-01-10 LAB — BRAIN NATRIURETIC PEPTIDE: B Natriuretic Peptide: 285.5 pg/mL — ABNORMAL HIGH (ref 0.0–100.0)

## 2022-01-10 LAB — CK: Total CK: 550 U/L — ABNORMAL HIGH (ref 49–397)

## 2022-01-10 MED ORDER — FUROSEMIDE 10 MG/ML IJ SOLN
40.0000 mg | Freq: Once | INTRAMUSCULAR | Status: AC
  Administered 2022-01-10: 40 mg via INTRAVENOUS
  Filled 2022-01-10: qty 4

## 2022-01-10 MED ORDER — ZINC SULFATE 220 (50 ZN) MG PO CAPS
220.0000 mg | ORAL_CAPSULE | Freq: Every day | ORAL | Status: DC
  Administered 2022-01-10 – 2022-01-17 (×8): 220 mg via ORAL
  Filled 2022-01-10 (×8): qty 1

## 2022-01-10 MED ORDER — ASCORBIC ACID 500 MG PO TABS
500.0000 mg | ORAL_TABLET | Freq: Two times a day (BID) | ORAL | Status: DC
  Administered 2022-01-10 – 2022-01-17 (×15): 500 mg via ORAL
  Filled 2022-01-10 (×15): qty 1

## 2022-01-10 MED ORDER — ENSURE MAX PROTEIN PO LIQD
11.0000 [oz_av] | Freq: Every day | ORAL | Status: DC
  Administered 2022-01-10 – 2022-01-15 (×5): 11 [oz_av] via ORAL
  Filled 2022-01-10 (×8): qty 330

## 2022-01-10 MED ORDER — GLUCERNA SHAKE PO LIQD
237.0000 mL | Freq: Two times a day (BID) | ORAL | Status: DC
  Administered 2022-01-12 – 2022-01-17 (×11): 237 mL via ORAL

## 2022-01-10 MED ORDER — ADULT MULTIVITAMIN W/MINERALS CH
1.0000 | ORAL_TABLET | Freq: Every day | ORAL | Status: DC
  Administered 2022-01-10 – 2022-01-17 (×8): 1 via ORAL
  Filled 2022-01-10 (×8): qty 1

## 2022-01-10 MED ORDER — INSULIN ASPART 100 UNIT/ML IJ SOLN
0.0000 [IU] | Freq: Three times a day (TID) | INTRAMUSCULAR | Status: DC
  Administered 2022-01-11: 3 [IU] via SUBCUTANEOUS
  Administered 2022-01-11: 2 [IU] via SUBCUTANEOUS
  Administered 2022-01-11: 1 [IU] via SUBCUTANEOUS
  Administered 2022-01-12: 5 [IU] via SUBCUTANEOUS
  Administered 2022-01-12 (×2): 1 [IU] via SUBCUTANEOUS
  Administered 2022-01-13: 2 [IU] via SUBCUTANEOUS
  Administered 2022-01-13 – 2022-01-14 (×2): 3 [IU] via SUBCUTANEOUS
  Administered 2022-01-14: 4 [IU] via SUBCUTANEOUS
  Administered 2022-01-14: 1 [IU] via SUBCUTANEOUS
  Administered 2022-01-15: 5 [IU] via SUBCUTANEOUS
  Administered 2022-01-15: 2 [IU] via SUBCUTANEOUS
  Administered 2022-01-16: 4 [IU] via SUBCUTANEOUS

## 2022-01-10 MED ORDER — DEXTROSE 50 % IV SOLN
INTRAVENOUS | Status: AC
  Filled 2022-01-10: qty 50

## 2022-01-10 MED ORDER — MAGNESIUM SULFATE 2 GM/50ML IV SOLN
2.0000 g | Freq: Once | INTRAVENOUS | Status: AC
  Administered 2022-01-10: 2 g via INTRAVENOUS
  Filled 2022-01-10: qty 50

## 2022-01-10 MED ORDER — DEXTROSE 50 % IV SOLN
12.5000 g | INTRAVENOUS | Status: AC
  Administered 2022-01-10: 12.5 g via INTRAVENOUS

## 2022-01-10 NOTE — Evaluation (Signed)
Physical Therapy Evaluation ?Patient Details ?Name: Jeffrey [name] ?MRN: [id number] ?DOB: 08-02-1966 ?Today's Date: 01/10/2022 ? ?History of Present Illness ? 56 y/o male presented to ED on 01/08/22 following unwitnessed seizure and found on floor. Found to be hypothermic. Recently admitted 3/16-3/27 for syncope and orthostatic hypotension. PMH: HTN, seizure disorder, DM2, hx of ETOH abuse, depression, hx of ABI  ?Clinical Impression ? Patient admitted with above diagnosis. Patient presents with generalized weakness, decreased activity tolerance, impaired balance, and limited functional mobility due to symptomatic orthostatics, see below. Patient was able to perform bed mobility with min guard and sit to stand with minA. Patient becoming less responsive in standing after 3 minutes and required maxA+2 to return to sitting. Patient will benefit from skilled PT services during acute stay to address listed deficits. Recommend HHPT at discharge to maximize functional independence and safety in the home.  ? ?Orthostatic BPs ? ?Supine 135/95  ?Sitting 152/103  ?Standing 126/110  ?Standing after 3 min 86/62  ?    ?   ? ?Recommendations for follow up therapy are one component of a multi-disciplinary discharge planning process, led by the attending physician.  Recommendations may be updated based on patient status, additional functional criteria and insurance authorization. ? ?Follow Up Recommendations Home health PT ? ?  ?Assistance Recommended at Discharge Intermittent Supervision/Assistance  ?Patient can return home with the following ? A little help with walking and/or transfers;Assistance with cooking/housework ? ?  ?Equipment Recommendations None recommended by PT  ?Recommendations for Other Services ?    ?  ?Functional Status Assessment Patient has had a recent decline in their functional status and demonstrates the ability to make significant improvements in function in a reasonable and predictable amount of time.  ? ?   ?Precautions / Restrictions Precautions ?Precautions: Fall;Other (comment) ?Precaution Comments: monitor orthostatics ?Restrictions ?Weight Bearing Restrictions: No  ? ?  ? ?Mobility ? Bed Mobility ?Overal bed mobility: Needs Assistance ?Bed Mobility: Supine to Sit, Sit to Supine ?  ?  ?Supine to sit: Min guard ?Sit to supine: Mod assist ?  ?General bed mobility comments: min guard to reach EOB but modA to return to bed with assist for B LE ?  ? ?Transfers ?Overall transfer level: Needs assistance ?Equipment used: Rolling Pepe Mineau (2 wheels) ?Transfers: Sit to/from Stand ?Sit to Stand: Min assist ?  ?  ?  ?  ?  ?General transfer comment: Pt needed max assist +2 to initiate sitting secondary to pt freezing up as BP decreased and not being able to initiate trunk and knee flexion. ?  ? ?Ambulation/Gait ?  ?  ?  ?  ?  ?  ?  ?General Gait Details: deferred due to symptomatic orthostatics ? ?Stairs ?  ?  ?  ?  ?  ? ?Wheelchair Mobility ?  ? ?Modified Rankin (Stroke Patients Only) ?  ? ?  ? ?Balance Overall balance assessment: Needs assistance, History of Falls ?Sitting-balance support: No upper extremity supported, Feet supported ?Sitting balance-Leahy Scale: Good ?  ?  ?Standing balance support: Bilateral upper extremity supported, During functional activity ?Standing balance-Leahy Scale: Poor ?Standing balance comment: Pt stood with the RW for support and took a few small steps sideways.  Limited mobility secondary to hypotension. ?  ?  ?  ?  ?  ?  ?  ?  ?  ?  ?  ?   ? ? ? ?Pertinent Vitals/Pain Pain Assessment ?Pain Assessment: No/denies pain  ? ? ?Home Living Family/patient expects to  be discharged to:: Private residence ?Living Arrangements: Children ?Available Help at Discharge: Family ?Type of Home: House ?Home Access: Stairs to enter ?Entrance Stairs-Rails: Right ?Entrance Stairs-Number of Steps: 3 ?  ?Home Layout: One level ?Home Equipment: Merchant navy officer (4 wheels);Adaptive equipment;Tub bench;Rolling  Tezra Mahr (2 wheels) ?   ?  ?Prior Function Prior Level of Function : Independent/Modified Independent ?  ?  ?  ?  ?  ?  ?Mobility Comments: uses a rollator outdoors; 4-5 falls in past 2 weeks due to syncopal episodes ?ADLs Comments: Modified Independent with ADLs, reports driving and grocery shopping. Pt reports progressing beyond using tub transfer bench and able to step over and stand in showers ?  ? ? ?Hand Dominance  ? Dominant Hand: Right ? ?  ?Extremity/Trunk Assessment  ? Upper Extremity Assessment ?Upper Extremity Assessment: Defer to OT evaluation ?  ? ?Lower Extremity Assessment ?Lower Extremity Assessment: Generalized weakness ?  ? ?Cervical / Trunk Assessment ?Cervical / Trunk Assessment: Normal  ?Communication  ? Communication: No difficulties;Expressive difficulties (slightly slower speech noted)  ?Cognition Arousal/Alertness: Awake/alert ?Behavior During Therapy: [facility] for tasks assessed/performed ?Overall Cognitive Status: History of cognitive impairments - at baseline ?  ?  ?  ?  ?  ?  ?  ?  ?  ?  ?  ?  ?  ?  ?  ?  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  Feel this is probably baseline for since diagnosis. ?  ?  ? ?  ?General Comments   ? ?  ?Exercises    ? ?Assessment/Plan  ?  ?PT Assessment Patient needs continued PT services  ?PT Problem List Decreased strength;Decreased balance;Decreased mobility;Decreased activity tolerance ? ?   ?  ?PT Treatment Interventions Gait training;DME instruction;Stair training;Functional mobility training;Therapeutic activities;Therapeutic exercise;Balance training;Patient/family education   ? ?PT Goals (Current goals can be found in the Care Plan section)  ?Acute Rehab PT Goals ?Patient Stated Goal: home ?PT Goal Formulation: With patient ?Time For Goal Achievement: 01/24/22 ?Potential to Achieve Goals: Good ? ?  ?Frequency Min 3X/week ?  ? ? ?Co-evaluation PT/OT/SLP Co-Evaluation/Treatment: Yes ?Reason for  Co-Treatment: Complexity of the patient's impairments (multi-system involvement) ?PT goals addressed during session: Mobility/safety with mobility;Balance ?OT goals addressed during session: ADL's and self-care;Proper use of Adaptive equipment and DME ?  ? ? ?  ?AM-PAC PT "6 Clicks" Mobility  ?Outcome Measure Help needed turning from your back to your side while in a flat bed without using bedrails?: None ?Help needed moving from lying on your back to sitting on the side of a flat bed without using bedrails?: A Little ?Help needed moving to and from a bed to a chair (including a wheelchair)?: A Little ?Help needed standing up from a chair using your arms (e.g., wheelchair or bedside chair)?: A Little ?Help needed to walk in hospital room?: A Little ?Help needed climbing 3-5 steps with a railing? : A Lot ?6 Click Score: 18 ? ?  ?End of Session Equipment Utilized During Treatment: Gait belt ?Activity Tolerance: Treatment limited secondary to medical complications (Comment) (orthostasis) ?Patient left: in bed;with call bell/phone within reach;with bed alarm set ?Nurse Communication: Mobility status ?PT Visit Diagnosis: Unsteadiness on feet (R26.81);Muscle weakness (generalized) (M62.81);History of falling (Z91.81);Other abnormalities of gait and mobility (R26.89) ?  ? ?Time: 1245-8099 ?PT Time Calculation (min) (ACUTE ONLY): 38 min ? ? ?Charges:   PT Evaluation ?$PT Eval Moderate Complexity: 1 Mod ?PT Treatments ?$Therapeutic Activity:  8-22 mins ?  ?   ? ? ?Orpheus Hayhurst A. Gilford Rile, PT, DPT ?Acute Rehabilitation Services ?Pager [phone] ?Office [phone] ? ? ?Wanza Szumski A Domanique Luckett ?01/10/2022, 3:03 PM ? ?

## 2022-01-10 NOTE — Progress Notes (Signed)
Latest Reference Range & Units 01/10/22 18:03  ?Glucose-Capillary 70 - 99 mg/dL 51 (L)  ?(L): Data is abnormally low ? ?8 oz apple juice given. Hypoglycemic orders placed. Next Bllod sugar 71, dextrose not given. Medical team aware, ? ?Haydee Salter, RN ? ?

## 2022-01-10 NOTE — Progress Notes (Signed)
Initial Nutrition Assessment ? ?DOCUMENTATION CODES:  ? ?Obesity unspecified ? ?INTERVENTION:  ? ?-Glucerna Shake po BID, each supplement provides 220 kcal and 10 grams of protein  ?-Ensure Max po daily, each supplement provides 150 kcal and 30 grams of protein ?-MVI with minerals daily ?-500 mg vitamin C BID ?-220 mg vitamin C daily x 14 days ? ?NUTRITION DIAGNOSIS:  ? ?Increased nutrient needs related to wound healing as evidenced by estimated needs. ? ?GOAL:  ? ?Patient will meet greater than or equal to 90% of their needs ? ?MONITOR:  ? ?PO intake, Supplement acceptance, Diet advancement, Labs, Weight trends, Skin, I & O's ? ?REASON FOR ASSESSMENT:  ? ?Consult ?Assessment of nutrition requirement/status ? ?ASSESSMENT:  ? ?Jeffrey [name] is a 56 y.o. male presenting with fall, found to be hypothermic on admission after an unwitnessed seizure. PMH is significant for a seizure disorder, HTN, DM2, hx of ETOH abuse, Depression, HLD, GERD ? ?Pt admitted with falls, generalized weakness, and seizure disorder.  ? ?Reviewed I/O's: 980 ml x 24 hours and +655 ml since admission ? ?UOP: 200 ml x 24 hours ? ?Pt unavailable at time of visit. Attempted to speak with pt via call to hospital room phone, however, unable to reach. RD unable to obtain further nutrition-related history or complete nutrition-focused physical exam at this time.   ? ?Pt currently on a regular diet with erratic intake. Noted meal completions 0-75%. Noted pt with history of ETOH abuse; suspect diet of poor nutritional quality PTA.  ? ?Reviewed wt hx; wt has been stable over the past 4 months.  ? ?Pt increased nutritional needs and would benefit from addition of oral nutrition supplements.  ? ?Per MD notes, pt reports more frequent falls at home and weakness.  ?  ?Medications reviewed and include folic acid, keppra, magnesium sulfate, thiamine, and magnesium sulfate.  ? ?Lab Results  ?Component Value Date  ? HGBA1C 7.8 (A) 11/19/2021  ? PTA DM  medications are 500 gm metformin BID and 3.47 mg trulicity weekly.  ? ?Labs reviewed: CBGS: 133-265 (inpatient orders for glycemic control are 0-15 units insulin aspart daily at bedtime).   ? ?Diet Order:   ?Diet Order   ? ?       ?  Diet regular Room service appropriate? Yes; Fluid consistency: Thin  Diet effective now       ?  ? ?  ?  ? ?  ? ? ?EDUCATION NEEDS:  ? ?No education needs have been identified at this time ? ?Skin:  Skin Assessment: Skin Integrity Issues: ?Skin Integrity Issues:: Diabetic Ulcer ?Diabetic Ulcer: rt toes, lt great and 2nd toes ? ?Last BM:  01/09/22 ? ?Height:  ? ?Ht Readings from Last 1 Encounters:  ?01/09/22 '5\' 10"'$  (1.778 m)  ? ? ?Weight:  ? ?Wt Readings from Last 1 Encounters:  ?01/10/22 119.8 kg  ? ? ?Ideal Body Weight:  75.5 kg ? ?BMI:  Body mass index is 37.9 kg/m?. ? ?Estimated Nutritional Needs:  ? ?Kcal:  2250-2450 ? ?Protein:  115-130 grams ? ?Fluid:  > 2 L ? ? ? ?Loistine Chance, RD, LDN, CDCES ?Registered Dietitian II ?Certified Diabetes Care and Education Specialist ?Please refer to [facility] for RD and/or RD on-call/weekend/after hours pager  ?

## 2022-01-10 NOTE — Progress Notes (Addendum)
.Family Medicine Teaching Service ?Daily Progress Note ?Intern Pager: [phone] ? ?Patient name: Jeffrey [name] Medical record number: [id number] ?Date of birth: [birth year] Age: 56 y.o. Gender: male ? ?Primary Care Provider: [name], MD ?Consultants: None ?Code Status: Full ? ?Pt Overview and Major Events to Date:  ?4/4-admitted ? ?Assessment and Plan: ?Jeffrey [name] is a 56 y.o. male presenting with fall, found to be hypothermic on admission after an unwitnessed seizure. PMH is significant for a seizure disorder, HTN, DM2, hx of ETOH abuse, Depression, HLD, GERD ? ?Falls  Generalized Weakness  Seizure Disorder ?Patient feels generally well this morning his only complaint is some ongoing weakness, especially with ambulation.  BP is much improved today to 140s/90s, up from 80s/50s yesterday.  Rolling walker at bedside.  Have not yet gotten orthostatics on him.  Continue to believe that the ultimate cause of his presentation was a seizure precipitated by heavy EtOH use.  However, given his multiple recent falls and admissions for generalized weakness, suspect that he may need extended rehab in a SNF. ?-Continue home Keppra and gabapentin ?-PT and OT to see given history of multiple recent falls ?-Seizure precautions ?-Obtain orthostatic vital signs ?-Continue telemetry monitoring ? ?Pitting Edema to BL UE and LE ?Hypoalbuminemia ?CKD IIIA ?Patient's edema is worsened today. Also with a slight bump in his Cr  from 1.56>1.8. He did receive a bolus yesterday as we thought he was intravascularly depleted.  Continue to believe that the primary driver of his peripheral edema is his hypoalbuminemia in the setting of poor nutritional status.  Low concern for CHF exacerbation given time course and recent normal echo.  I am somewhat concerned for a hepatic decompensation as the source of his edema given recent EtOH use.  Nephrotic syndrome also remains on the differential given his proteinuria. ?Patient is  complaining of pain in his right upper extremity and that swelling is worse on the right than the left.  Therefore some concern for DVT. ?-Trial dose of Lasix 40 mg IV, it appears he has been on Lasix 40 mg p.o. in the past, he may ultimately require Lasix and spironolactone ?-Ultrasound abdomen ?-DVT ultrasound right upper extremity ?-Urine protein creatinine ratio to evaluate for nephrosis ? ?Prolonged QTc  ?QTc 545>527.  ECG this morning also notable for diffuse T wave inversions but in a stable pattern when compared to yesterday's study. ?-Replete K to >4, mag >2 ? ?History of abnormal ACTH Stim Test ?-Given recovery of blood pressure with fluid resuscitation yesterday, do not see an indication for fludrocortisone at this time ? ?Elevated CK on admission ?CK 931>550.  No evidence to suggest rhabdomyolysis.  Suspect that CK elevation was secondary to the seizure. ?-No need for further follow-up ? ?Hx of EtOH Abuse  Transaminitis ?AST 40>56. ALT 46>62. ALP 178>286.  No signs of withdrawals. ?-Can discontinue CIWA ?-Continue thiamine 100 mg daily ?-Continue folate 1 mg daily ? ?Normocytic Anemia ?Thrombocytosis ?Hemoglobin 8.0 > 11.3.  Suspect there may be a component of hemoconcentration given platelets are also up 426 > 532. ? ?Diabetes mellitus ?Glucose 123-133 overnight.  Got a total of 12 units short acting insulin.  Was not eating meals yesterday, but was observed eating breakfast this morning. ?-CBGs, sSSI ?-Continue home gabapentin for neuropathy ? ?Chronic, stable conditions ?GERD-Protonix ?MDD-Cymbalta, mirtazapine ? ?FEN/GI: Regular diet ?PPx: Lovenox ?Dispo:Pending PT recommendations    ? ?Subjective:  ?Jeffrey [name] has no acute complaints this morning.  He does express concerns that he is having  more frequent falls at home.  He states that he has had to call EMS several times to help him get up off the floor after he has fallen.  He understands that his seizure was likely precipitated in the setting of  heavy alcohol use and expresses regret over this. ? ?Objective: ?Temp:  [97.5 ?F (36.4 ?C)-98.3 ?F (36.8 ?C)] 97.9 ?F (36.6 ?C) (04/06 0500) ?Pulse Rate:  [83-109] 85 (04/06 0500) ?Resp:  [13-18] 17 (04/06 0500) ?BP: (129-149)/(80-104) 140/93 (04/06 0500) ?SpO2:  [93 %-100 %] 99 % (04/06 0500) ?Weight:  [118.8 kg-119.8 kg] 119.8 kg (04/06 0500) ?Physical Exam: ?General: Sitting up eating breakfast, in good spirits, no distress ?Cardiovascular: Regular rate, regular rhythm, no murmur ?Respiratory: Lungs are clear throughout, normal work of breathing on room air ?Abdomen: Obese, nontender, no fluid wave and no distention ?Extremities: Remains with pitting edema to thighs bilaterally, somewhat worse compared to my previous exam, also with 2+ pitting edema to bilateral hands, RUE >LUE.  RUE also with tautness >left ? ?Laboratory: ?Recent Labs  ?Lab 01/08/22 ?2305 01/09/22 ?0623 01/10/22 ?7628  ?WBC 7.9 6.3 6.8  ?HGB 9.3* 8.0* 11.3*  ?HCT 29.4* 24.5* 35.0*  ?PLT 489* 426* 532*  ? ?Recent Labs  ?Lab 01/08/22 ?2000 01/09/22 ?3151 01/10/22 ?7616  ?NA 139 140 140  ?K 4.6 4.5 4.6  ?CL 110 109 109  ?CO2 20* 22 23  ?BUN '19 18 18  '$ ?CREATININE 1.55* 1.56* 1.80*  ?CALCIUM 7.5* 7.1* 7.6*  ?PROT 5.3* 4.3* 6.3*  ?BILITOT 0.4 0.2* 0.4  ?ALKPHOS 233* 178* 286*  ?ALT 59* 46* 62*  ?AST 53* 40 56*  ?GLUCOSE 113* 85 123*  ? ? ? ?Imaging/Diagnostic Tests: ?DG Chest Portable 1 View ?CLINICAL DATA:  Altered mental status ? ?EXAM: ?PORTABLE CHEST 1 VIEW ? ?COMPARISON:  12/20/2021 ? ?FINDINGS: ?Heart and mediastinal contours are within normal limits. No focal ?opacities or effusions. No acute bony abnormality. ? ?IMPRESSION: ?No active disease. ? ?Electronically Signed ?  By: Rolm Baptise M.D. ?  On: 01/08/2022 21:42 ?CT Maxillofacial Wo Contrast ?CLINICAL DATA:  Unwitnessed fall. ? ?EXAM: ?CT HEAD WITHOUT CONTRAST ? ?CT MAXILLOFACIAL WITHOUT CONTRAST ? ?TECHNIQUE: ?Multidetector CT imaging of the head and maxillofacial structures ?were  performed using the standard protocol without intravenous ?contrast. Multiplanar CT image reconstructions of the maxillofacial ?structures were also generated. ? ?RADIATION DOSE REDUCTION: This exam was performed according to the ?departmental dose-optimization program which includes automated ?exposure control, adjustment of the mA and/or kV according to ?patient size and/or use of iterative reconstruction technique. ? ?COMPARISON:  January 01, 2022 ? ?FINDINGS: ?CT HEAD FINDINGS ? ?Brain: No evidence of acute infarction, hemorrhage, hydrocephalus, ?extra-axial collection or mass lesion/mass effect. ? ?Vascular: No hyperdense vessel or unexpected calcification. ? ?Skull: Normal. Negative for fracture or focal lesion. ? ?Other: None. ? ?CT MAXILLOFACIAL FINDINGS ? ?Osseous: No fracture or mandibular dislocation. No destructive ?process. ? ?Orbits: Negative. No traumatic or inflammatory finding. ? ?Sinuses: Clear. ? ?Soft tissues: There appears to be a left periorbital soft tissue ?contusion as well as swelling overlying the left mandibular and ?maxillary regions consistent with contusion. ? ?IMPRESSION: ?No acute intracranial abnormality seen. ? ?No fracture is noted in the maxillofacial region. Left periorbital ?soft tissue contusion is noted as well as probable contusions ?overlying the left mandibular and maxillary regions. ? ?Electronically Signed ?  By: Marijo Conception M.D. ?  On: 01/08/2022 20:48 ?CT HEAD WO CONTRAST (5MM) ?CLINICAL DATA:  Unwitnessed fall. ? ?EXAM: ?CT HEAD  WITHOUT CONTRAST ? ?CT MAXILLOFACIAL WITHOUT CONTRAST ? ?TECHNIQUE: ?Multidetector CT imaging of the head and maxillofacial structures ?were performed using the standard protocol without intravenous ?contrast. Multiplanar CT image reconstructions of the maxillofacial ?structures were also generated. ? ?RADIATION DOSE REDUCTION: This exam was performed according to the ?departmental dose-optimization program which includes automated ?exposure  control, adjustment of the mA and/or kV according to ?patient size and/or use of iterative reconstruction technique. ? ?COMPARISON:  January 01, 2022 ? ?FINDINGS: ?CT HEAD FINDINGS ? ?Brain: No evidence of acute infarct

## 2022-01-10 NOTE — Progress Notes (Signed)
Patient eating dinner tray this time. Will check CBG after ?

## 2022-01-10 NOTE — Progress Notes (Signed)
FPTS Brief Progress Note ? ?S:Patient awake and using bathroom, but had no concerns or request. ? ? ?O: ?BP (!) 149/80 (BP Location: Left Arm)   Pulse 83   Temp 97.8 ?F (36.6 ?C) (Oral)   Resp 18   Ht '5\' 10"'$  (1.778 m)   Wt 118.8 kg   SpO2 98%   BMI 37.58 kg/m?   ? ? ?A/P: ? ?- Orders reviewed. Labs for AM ordered, which was adjusted as needed.  ?- Plans per day team ? ?Holley Bouche, MD ?01/10/2022, 3:10 AM ?PGY-1, [place] [facility] Night Resident  ?Please page [phone] with questions.  ? ? ?

## 2022-01-10 NOTE — Evaluation (Signed)
Occupational Therapy Evaluation ?Patient Details ?Name: Jeffrey [name] ?MRN: [id number] ?DOB: 05-May-1966 ?Today's Date: 01/10/2022 ? ? ?History of Present Illness 56 y/o male presented to ED on 01/08/22 following unwitnessed seizure and found on floor. Found to be hypothermic. Recently admitted 3/16-3/27 for syncope and orthostatic hypotension. PMH: HTN, seizure disorder, DM2, hx of ETOH abuse, depression, hx of ABI  ? ?Clinical Impression ?  ?Pt currently presents with the need for min assist to complete selfcare tasks and simulated transfers with use of the RW.  With sitting and initial standing BP was 126/110 but within 3 mins he became hypotensive dropping to 86/62.  He then became less responsive but still awake with assist needed to complete stand to sit transition.  Feel he will benefit from acute care OT to help increase strength, and improve performance and safety with completion of selfcare tasks.    ?   ? ?Recommendations for follow up therapy are one component of a multi-disciplinary discharge planning process, led by the attending physician.  Recommendations may be updated based on patient status, additional functional criteria and insurance authorization.  ? ?Follow Up Recommendations ? Home health OT  ?  ?Assistance Recommended at Discharge Intermittent Supervision/Assistance  ?Patient can return home with the following Assistance with cooking/housework;Assist for transportation;Help with stairs or ramp for entrance;A little help with bathing/dressing/bathroom;A little help with walking and/or transfers ? ?  ?Functional Status Assessment ? Patient has had a recent decline in their functional status and demonstrates the ability to make significant improvements in function in a reasonable and predictable amount of time.  ?Equipment Recommendations ? None recommended by OT  ?  ?   ?Precautions / Restrictions Precautions ?Precautions: Fall;Other (comment) ?Precaution Comments: monitor  orthostatics ?Restrictions ?Weight Bearing Restrictions: No  ? ?  ? ?Mobility Bed Mobility ?  ?Bed Mobility: Supine to Sit ?  ?  ?Supine to sit: Min guard, HOB elevated (slight elevation of less than 30 degrees) ?  ?  ?  ?  ? ?Transfers ?Overall transfer level: Needs assistance ?Equipment used: Rolling walker (2 wheels) ?Transfers: Sit to/from Stand ?Sit to Stand: Min assist ?  ?  ?  ?  ?  ?General transfer comment: Pt needed max assist +2 to initiate sitting secondary to pt freezing up as BP decreased and not being able to initiate trunk and knee flexion. ?  ? ?  ?Balance Overall balance assessment: Needs assistance, History of Falls ?Sitting-balance support: No upper extremity supported, Feet supported ?Sitting balance-Leahy Scale: Good ?  ?  ?Standing balance support: Bilateral upper extremity supported, During functional activity ?Standing balance-Leahy Scale: Poor ?Standing balance comment: Pt stood with the RW for support and took a few small steps sideways.  Limited mobility secondary to hypotension. ?  ?  ?  ?  ?  ?  ?  ?  ?  ?  ?  ?   ? ?ADL either performed or assessed with clinical judgement  ? ?ADL Overall ADL's : Needs assistance/impaired ?Eating/Feeding: Independent;Sitting ?Eating/Feeding Details (indicate cue type and reason): EOB ?  ?  ?Upper Body Bathing: Set up;Sitting ?Upper Body Bathing Details (indicate cue type and reason): simulated ?Lower Body Bathing: Sit to/from stand;Minimal assistance ?Lower Body Bathing Details (indicate cue type and reason): without use of AE ?Upper Body Dressing : Set up;Sitting ?Upper Body Dressing Details (indicate cue type and reason): simulated ?Lower Body Dressing: Moderate assistance;Sit to/from stand ?Lower Body Dressing Details (indicate cue type and reason): simulated without use of AE ?  Toilet Transfer: Minimal assistance;BSC/3in1;Stand-pivot ?Toilet Transfer Details (indicate cue type and reason): simulated with use of the RW ?Toileting- Clothing  Manipulation and Hygiene: Minimal assistance;Sit to/from stand ?Toileting - Clothing Manipulation Details (indicate cue type and reason): simulated ?  ?  ?  ?General ADL Comments: Pt with significant drop in BP after standing for greater than 3 mins to 86/62 from initial standing which was 126/110.  Pt with increased fogginess and posterior LOB when this occurred as well as decreased sustained attention.  His attention improved after sitting with BP in the 152/103 range.  ? ? ? ?Vision Baseline Vision/History: 1 Wears glasses (bifocals) ?Ability to See in Adequate Light: 1 Impaired ?Patient Visual Report: No change from baseline ?Vision Assessment?: No apparent visual deficits  ?   ?Perception Perception ?Perception: Within Functional Limits ?  ?Praxis Praxis ?Praxis: Intact ?  ? ?Pertinent Vitals/Pain Pain Assessment ?Pain Assessment: No/denies pain  ? ? ? ?Hand Dominance Right ?  ?Extremity/Trunk Assessment Upper Extremity Assessment ?Upper Extremity Assessment: Overall WFL for tasks assessed (Not formally assessed but at least 3+/5 throughout) ?  ?  ?  ?Cervical / Trunk Assessment ?Cervical / Trunk Assessment: Normal ?  ?Communication Communication ?Communication: No difficulties;Expressive difficulties (slightly slower speech noted) ?  ?Cognition Arousal/Alertness: Awake/alert ?Behavior During Therapy: [place] for tasks assessed/performed ?Overall Cognitive Status: History of cognitive impairments - at baseline ?  ?  ?  ?  ?  ?  ?  ?  ?  ?  ?  ?  ?  ?  ?  ?  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  Feel this is probably baseline for since diagnosis. ?  ?  ?   ?   ?   ? ? ?Home Living Family/patient expects to be discharged to:: Private residence ?Living Arrangements: Children ?Available Help at Discharge: Family ?Type of Home: House ?Home Access: Stairs to enter ?Entrance Stairs-Number of Steps: 3 ?Entrance Stairs-Rails: Right ?Home Layout: One level ?  ?   ?Bathroom Shower/Tub: Tub/shower unit ?  ?Bathroom Toilet: Standard ?  ?  ?Home Equipment: Merchant navy officer (4 wheels);Adaptive equipment;Tub bench;Rolling Walker (2 wheels) ?Adaptive Equipment: Reacher;Sock aid ?  ?  ? ?  ?Prior Functioning/Environment Prior Level of Function : Independent/Modified Independent ?  ?  ?  ?  ?  ?  ?Mobility Comments: uses a rollator outdoors; 4-5 falls in past 2 weeks due to syncopal episodes ?ADLs Comments: Modified Independent with ADLs, reports driving and grocery shopping. Pt reports progressing beyond using tub transfer bench and able to step over and stand in showers ?  ? ?  ?  ?OT Problem List: Impaired balance (sitting and/or standing);Decreased activity tolerance ?  ?   ?OT Treatment/Interventions: Self-care/ADL training;Therapeutic exercise;DME and/or AE instruction;Therapeutic activities;Patient/family education  ?  ?OT Goals(Current goals can be found in the care plan section) Acute Rehab OT Goals ?Patient Stated Goal: Pt wants to be able to stop falling. ?OT Goal Formulation: With patient ?Time For Goal Achievement: 01/24/22 ?Potential to Achieve Goals: Good  ?OT Frequency: Min 2X/week ?  ? ?Co-evaluation PT/OT/SLP Co-Evaluation/Treatment: Yes ?Reason for Co-Treatment: Complexity of the patient's impairments (multi-system involvement) ?  ?OT goals addressed during session: ADL's and self-care;Proper use of Adaptive equipment and DME ?  ? ?  ?AM-PAC OT "6 Clicks" Daily Activity     ?Outcome Measure Help from another person eating meals?: None ?Help from another person taking care of personal grooming?: A Little ?  Help from another person toileting, which includes using toliet, bedpan, or urinal?: A Little ?Help from another person bathing (including washing, rinsing, drying)?: A Little ?Help from another person to put on and taking off regular upper body clothing?: None ?  ?6 Click Score: 17 ?  ?End of Session Equipment Utilized During Treatment: Gait belt;Rolling  walker (2 wheels) ?Nurse Communication: Mobility status;Other (comment) (hypotension) ? ?Activity Tolerance: Other (comment) (Limited with standing secondary to hypotension) ?Patient left: in chair;with call bell/phone within reach;with [facility]

## 2022-01-11 ENCOUNTER — Inpatient Hospital Stay (HOSPITAL_COMMUNITY): Payer: Commercial Managed Care - HMO

## 2022-01-11 DIAGNOSIS — R9431 Abnormal electrocardiogram [ECG] [EKG]: Secondary | ICD-10-CM | POA: Diagnosis not present

## 2022-01-11 DIAGNOSIS — I10 Essential (primary) hypertension: Secondary | ICD-10-CM

## 2022-01-11 DIAGNOSIS — D638 Anemia in other chronic diseases classified elsewhere: Secondary | ICD-10-CM

## 2022-01-11 DIAGNOSIS — R748 Abnormal levels of other serum enzymes: Secondary | ICD-10-CM | POA: Diagnosis not present

## 2022-01-11 DIAGNOSIS — N390 Urinary tract infection, site not specified: Secondary | ICD-10-CM

## 2022-01-11 DIAGNOSIS — B9689 Other specified bacterial agents as the cause of diseases classified elsewhere: Secondary | ICD-10-CM

## 2022-01-11 DIAGNOSIS — Z794 Long term (current) use of insulin: Secondary | ICD-10-CM

## 2022-01-11 DIAGNOSIS — K7030 Alcoholic cirrhosis of liver without ascites: Secondary | ICD-10-CM

## 2022-01-11 DIAGNOSIS — R569 Unspecified convulsions: Secondary | ICD-10-CM | POA: Diagnosis not present

## 2022-01-11 DIAGNOSIS — E1140 Type 2 diabetes mellitus with diabetic neuropathy, unspecified: Secondary | ICD-10-CM

## 2022-01-11 LAB — COMPREHENSIVE METABOLIC PANEL
ALT: 44 U/L (ref 0–44)
AST: 37 U/L (ref 15–41)
Albumin: <1.5 g/dL — ABNORMAL LOW (ref 3.5–5.0)
Alkaline Phosphatase: 215 U/L — ABNORMAL HIGH (ref 38–126)
Anion gap: 4 — ABNORMAL LOW (ref 5–15)
BUN: 20 mg/dL (ref 6–20)
CO2: 25 mmol/L (ref 22–32)
Calcium: 7.2 mg/dL — ABNORMAL LOW (ref 8.9–10.3)
Chloride: 109 mmol/L (ref 98–111)
Creatinine, Ser: 1.74 mg/dL — ABNORMAL HIGH (ref 0.61–1.24)
GFR, Estimated: 45 mL/min — ABNORMAL LOW (ref >60)
Glucose, Bld: 278 mg/dL — ABNORMAL HIGH (ref 70–99)
Potassium: 4.6 mmol/L (ref 3.5–5.1)
Sodium: 138 mmol/L (ref 135–145)
Total Bilirubin: 0.7 mg/dL (ref 0.3–1.2)
Total Protein: 4.6 g/dL — ABNORMAL LOW (ref 6.5–8.1)

## 2022-01-11 LAB — MAGNESIUM: Magnesium: 1.7 mg/dL (ref 1.7–2.4)

## 2022-01-11 LAB — ECHOCARDIOGRAM LIMITED
Height: 70 in
S' Lateral: 3.3 cm
Weight: 4215.2 oz

## 2022-01-11 LAB — GLUCOSE, CAPILLARY
Glucose-Capillary: 269 mg/dL — ABNORMAL HIGH (ref 70–99)
Glucose-Capillary: 284 mg/dL — ABNORMAL HIGH (ref 70–99)
Glucose-Capillary: 186 mg/dL — ABNORMAL HIGH (ref 70–99)
Glucose-Capillary: 229 mg/dL — ABNORMAL HIGH (ref 70–99)
Glucose-Capillary: 285 mg/dL — ABNORMAL HIGH (ref 70–99)

## 2022-01-11 LAB — URINE CULTURE: Culture: >=100000 — AB

## 2022-01-11 MED ORDER — MAGNESIUM SULFATE 2 GM/50ML IV SOLN
2.0000 g | Freq: Once | INTRAVENOUS | Status: AC
  Administered 2022-01-11: 2 g via INTRAVENOUS
  Filled 2022-01-11: qty 50

## 2022-01-11 MED ORDER — CEFADROXIL 500 MG PO CAPS
500.0000 mg | ORAL_CAPSULE | Freq: Two times a day (BID) | ORAL | Status: DC
  Administered 2022-01-11 – 2022-01-17 (×12): 500 mg via ORAL
  Filled 2022-01-11 (×13): qty 1

## 2022-01-11 MED ORDER — CEFDINIR 300 MG PO CAPS
300.0000 mg | ORAL_CAPSULE | Freq: Two times a day (BID) | ORAL | Status: DC
  Administered 2022-01-11: 300 mg via ORAL
  Filled 2022-01-11 (×2): qty 1

## 2022-01-11 MED ORDER — METOPROLOL SUCCINATE ER 25 MG PO TB24
25.0000 mg | ORAL_TABLET | Freq: Once | ORAL | Status: AC
  Administered 2022-01-12: 25 mg via ORAL
  Filled 2022-01-11: qty 1

## 2022-01-11 MED ORDER — FUROSEMIDE 10 MG/ML IJ SOLN
40.0000 mg | Freq: Once | INTRAMUSCULAR | Status: AC
  Administered 2022-01-11: 40 mg via INTRAVENOUS
  Filled 2022-01-11: qty 4

## 2022-01-11 MED ORDER — COSYNTROPIN 0.25 MG IJ SOLR
0.2500 mg | Freq: Once | INTRAMUSCULAR | Status: DC
  Filled 2022-01-11: qty 0.25

## 2022-01-11 NOTE — Progress Notes (Signed)
FPTS Brief Progress Note ? ?S: Received page that patient BP was 182/108, went to asses patient with Dr. Larae Grooms. Upon arrival to room, nurse was talking with patient. Patient was without complaint or symptoms (chest pain, vision changes, dyspnea, weakness). Patient notes that BP can be as high as 193/110 at home, without issue.  ? ? ?O: ?BP (!) 182/108 (BP Location: Right Arm)   Pulse 84   Temp 97.9 ?F (36.6 ?C) (Oral)   Resp 18   Ht '5\' 10"'$  (1.778 m)   Wt 119.5 kg   SpO2 100%   BMI 37.80 kg/m?   ?General: Well appearing, NAD, conversational, African American male ?Card: RRR, [place] ?Resp: CTABL, normal work of breathing ? ?A/P: ?Patient appears stable with no concern for hypertensive emergency at this time. Will have follow up manual BP in 1 hour. Will continue to monitor patient BP and symptoms. Will consider treating for SBP > 200, DBP > 120, with IV labetalol.  ?- Repeat BP in 1 hour ?- Will consider IV labetalol for SBP > 200 / DBP > 120 ?- Orders reviewed. Labs for AM ordered, which was adjusted as needed.  ?  ? ?Holley Bouche, MD ?01/11/2022, 10:36 PM ?PGY-1, [place] [facility] Night Resident  ?Please page [phone] with questions.  ? ? ?

## 2022-01-11 NOTE — Progress Notes (Addendum)
Pt was brought to [name] for paracentesis. Upon US examination, there was no fluid on L and a very small fluid pocket on R that was not large enough to safely access.  ?Exam was ended and explained to patient.  ?Patient  in agreement with findings.    ? ? ? ?Narda Rutherford, AGNP-BC ?01/11/2022, 1:44 PM ? ? ?

## 2022-01-11 NOTE — Progress Notes (Addendum)
,Family Medicine Teaching Service ?Daily Progress Note ?Intern Pager: [phone] ? ?Patient name: Jeffrey [name] Medical record number: [id number] ?Date of birth: [birth year] Age: 56 y.o. Gender: male ? ?Primary Care Provider: [name], MD ?Consultants: None ?Code Status: Full ? ?Pt Overview and Major Events to Date:  ?4/4- admitted ? ?Assessment and Plan: ?Jeffrey [name] is a 56 y.o. male presenting with fall after an unwitnessed seizure at home, also with generalized weakness. PMH is significant for a seizure disorder, hx of cardiac arrest, HTN, DM2, hx of ETOH abuse, Depression, HLD, GERD ? ?Pitting Edema  Hypoalbuminemia ?CKD IIIA ?UOP poorly documented but edema is much improved today. Weight is down 0.3kg. Cr 1.80>1.74 after Lasix '40mg'$  IV x1 yesterday. Urine Protein Creatinine Ratio 1.61 which, while elevated, is sub-nephrotic range proteinuria. Suspect hepatic source. [name] yesterday with diffuse hepatocellular disease and perihepatic ascites. DVT US negative yesterday.  ?- Echo pending ?- Redose Lasix, could also add spironolactone if suboptimal response though has a history of marked hyperkalemia on spiro in the past. If adding spironolactone, could also start empiric Lokelma and check q12 Potassium for a few doses ?- IR paracentesis with body fluid studies given new ascites on [name] yesterday ?- Diet supplementation per RD recs ?- Compression stockings ? ?Orthostatic Hypotension  Generalized weakness ?History of abnormal ACTH stim test ?Awaiting echo as above. Baseline BP has been steadily increasing to 177/114 this morning. Had considered empiric trial of Florinef, but given hypertension at this time, hesitate to do so.  Was able to work with PT/OT yesterday who recommend [facility].  ?- If echo does not demonstrate a possible cause of his orthostasis, could repeat ACTH stim test ?- PT/OT continuing to work with him ?- Home health PT/OT on discharge ? ?Klebsiella UTI ?Patient with >100,000 colonies of  Klebsiella pneumonia in his urine.  Had previously denied symptoms but today endorsing urgency and dysuria. ?- Cefdinir '300mg'$  BID ? ?Prolonged QTc ?QTc [id number]. ECG this morning with deep T wave inversions in V2 and V3 as observed yesterday. ?- Replete K to >4, mag >2 ?- Echo as above ? ?DM2 ?Had some hypoglycemic episodes yesterday after getting 7u SAI with lunch. 46>55>71, got dextrose. Transitioned to very sensitive sliding scale. AM glucose 269.  ?- CBGs, vsSSI ? ?Transaminitis  EtOH abuse ?AST and ALT both normalized today. 56>37 and 62>44 respectively.  ? ?Chronic, stable conditions ?GERD-Protonix ?MDD-Cymbalta, mirtazapine ?Normocytic anemia ? ?FEN/GI: Regular ?PPx: Lovenox ?Dispo:Home with home health  tomorrow. Barriers include clinical improvement, no clear source of orthostasis at this time.  ? ?Subjective:  ?Jeffrey [name] reports feeling generally well this morning.  He does complain of abdominal "bloating".  He also endorses a history of urinary urgency and dysuria in the days leading up to this hospitalization.  He becomes tearful when talking about his cardiac history and his fear of a future cardiac event. ? ?Objective: ?Temp:  [97.4 ?F (36.3 ?C)-97.9 ?F (36.6 ?C)] 97.5 ?F (36.4 ?C) (04/07 0519) ?Pulse Rate:  [82-96] 84 (04/07 0519) ?Resp:  [18-19] 18 (04/07 0519) ?BP: (136-177)/(89-114) 177/114 (04/07 0519) ?SpO2:  [97 %-99 %] 99 % (04/07 0519) ?Weight:  [119.5 kg] 119.5 kg (04/07 0518) ?Physical Exam: ?General: Awake and alert sitting up eating breakfast ?Cardiovascular: Regular rate and rhythm, without murmurs, no JVD ?Respiratory: Normal work of breathing on room air, lungs clear throughout ?Abdomen: Obese, nontender, no apparent fluid wave but exam limited by body habitus ?Extremities: Edema to BUE and BLE is much improved compared to  previous, though remains with 1+ pitting edema to bilateral lower extremities right greater than sign left. ? ?Laboratory: ?Recent Labs  ?Lab 01/08/22 ?2305  01/09/22 ?3009 01/10/22 ?2330  ?WBC 7.9 6.3 6.8  ?HGB 9.3* 8.0* 11.3*  ?HCT 29.4* 24.5* 35.0*  ?PLT 489* 426* 532*  ? ?Recent Labs  ?Lab 01/09/22 ?0762 01/10/22 ?2633 01/11/22 ?0253  ?NA 140 140 138  ?K 4.5 4.6 4.6  ?CL 109 109 109  ?CO2 '22 23 25  '$ ?BUN '18 18 20  '$ ?CREATININE 1.56* 1.80* 1.74*  ?CALCIUM 7.1* 7.6* 7.2*  ?PROT 4.3* 6.3* 4.6*  ?BILITOT 0.2* 0.4 0.7  ?ALKPHOS 178* 286* 215*  ?ALT 46* 62* 44  ?AST 40 56* 37  ?GLUCOSE 85 123* 278*  ? ? ?Imaging/Diagnostic Tests: ?US Abdomen Limited RUQ (LIVER/GB) ?CLINICAL DATA:  Abdominal pain. ? ?EXAM: ?ULTRASOUND ABDOMEN LIMITED RIGHT UPPER QUADRANT ? ?COMPARISON:  None. ? ?FINDINGS: ?Gallbladder: ? ?No gallstones or wall thickening visualized. No sonographic Percell Miller ?sign noted by sonographer. ? ?Common bile duct: ? ?Diameter: 4 mm, within normal limits. ? ?Liver: ? ?Diffusely increased echogenicity of the hepatic parenchyma, ?consistent with diffuse hepatocellular disease. Mild perihepatic ?ascites is seen. No hepatic mass identified. Portal vein is patent ?on color Doppler imaging with normal direction of blood flow towards ?the liver. ? ?Other: None. ? ?IMPRESSION: ?No evidence of cholelithiasis or biliary ductal dilatation. ? ?Diffuse hepatocellular disease and perihepatic ascites. Cirrhosis ?cannot be excluded. ? ?Electronically Signed ?  By: Marlaine Hind M.D. ?  On: 01/10/2022 18:37 ?VAS [name] UPPER EXTREMITY VENOUS DUPLEX ?UPPER VENOUS STUDY ? ?  ?Patient Name:  Jeffrey [name] [facility]  Date of Exam:   01/10/2022 ?Medical Rec #: [id number]         Accession #:    [id number] ?Date of Birth: 1966/02/27         Patient Gender: M ?Patient Age:   49 years ?Exam Location:  [facility] ?Procedure:      VAS [name] UPPER EXTREMITY VENOUS DUPLEX ?Referring Phys: CARINA BROWN ? ?-------------------------------------------------------------------------------- ?  ?Indications: Pain ?Risk Factors: None identified. ?Limitations: Poor ultrasound/tissue interface. ?Comparison Study: No  prior studies. ? ?Performing Technologist: Oliver Hum RVT ? ?  ?Examination Guidelines: A complete evaluation includes B-mode imaging, spectral ?Doppler, color Doppler, and power Doppler as needed of all accessible portions ?of each vessel. Bilateral testing is considered an integral part of a complete ?examination. Limited examinations for reoccurring indications may be performed ?as noted. ? ?  ?Right Findings: ?+----------+------------+---------+-----------+----------+-------+ ?RIGHT     CompressiblePhasicitySpontaneousPropertiesSummary ?+----------+------------+---------+-----------+----------+-------+ ?IJV           Full       Yes       Yes                      ?+----------+------------+---------+-----------+----------+-------+ ?Subclavian    Full       Yes       Yes                      ?+----------+------------+---------+-----------+----------+-------+ ?Axillary      Full       Yes       Yes                      ?+----------+------------+---------+-----------+----------+-------+ ?Brachial      Full       Yes       Yes                      ?+----------+------------+---------+-----------+----------+-------+ ?  Radial        Full                                          ?+----------+------------+---------+-----------+----------+-------+ ?Ulnar         Full                                          ?+----------+------------+---------+-----------+----------+-------+ ?Cephalic      Full                                          ?+----------+------------+---------+-----------+----------+-------+ ?Basilic       Full                                          ?+----------+------------+---------+-----------+----------+-------+ ? ?  ?Left Findings: ?+----------+------------+---------+-----------+----------+-------+ ?LEFT       CompressiblePhasicitySpontaneousPropertiesSummary ?+----------+------------+---------+-----------+----------+-------+ ?Subclavian    Full       Yes       Yes                      ?+----------+------------+---------+-----------+----------+-------+ ? ?  ?Summary: ?  ?Right: ?No evidence of deep vein thrombosis in the upper extremity. No evidence of ?superficial vein thrombosis in the upper extremity. ?  ?Left: ?No e

## 2022-01-11 NOTE — Progress Notes (Signed)
FPTS Brief Progress Note ? ?S:Patient sleeping comfortably in bed. ? ? ?O: ?BP (!) 154/101 (BP Location: Left Arm)   Pulse 94   Temp (!) 97.4 ?F (36.3 ?C) (Oral)   Resp 18   Ht '5\' 10"'$  (1.778 m)   Wt 119.8 kg   SpO2 97%   BMI 37.90 kg/m?   ? ? ?A/P: ?- Orders reviewed. Labs for AM ordered, which was adjusted as needed.  ?- Plans per day team ? ?Holley Bouche, MD ?01/11/2022, 2:03 AM ?PGY-1, [place] Medicine Night Resident  ?Please page [phone] with questions.  ? ? ?

## 2022-01-11 NOTE — Progress Notes (Signed)
Physical Therapy Treatment ?Patient Details ?Name: Jeffrey [name] ?MRN: [id number] ?DOB: 1965-10-20 ?Today's Date: 01/11/2022 ? ? ?History of Present Illness 56 y/o male presented to ED on 01/08/22 following unwitnessed seizure and found on floor. Found to be hypothermic. Recently admitted 3/16-3/27 for syncope and orthostatic hypotension. PMH: HTN, seizure disorder, DM2, hx of ETOH abuse, depression, hx of ABI ? ?  ?PT Comments  ? ? Patient continues to be limited +orthostatics, see below. Limited to in room ambulation due to lack of +2 assist for safety. Patient ambulated directly to/from chair to allow for option to sit if needed due to BP. Patient frustrated by current situation but is unsafe to d/c home at this time due to consistent drop in BP with mobility. Once medically ready and BP stabilizes, patient would benefit from HHPT.  ? ?Orthostatic BPs ? ?Supine 158/111  ?Sitting 151/107  ?Standing 122/89  ?Standing after 3 min 132/92  ?After ambulation 15'  107/84  ? ?  ?Recommendations for follow up therapy are one component of a multi-disciplinary discharge planning process, led by the attending physician.  Recommendations may be updated based on patient status, additional functional criteria and insurance authorization. ? ?Follow Up Recommendations ? Home health PT ?  ?  ?Assistance Recommended at Discharge Intermittent Supervision/Assistance  ?Patient can return home with the following A little help with walking and/or transfers;Assistance with cooking/housework ?  ?Equipment Recommendations ? None recommended by PT  ?  ?Recommendations for Other Services   ? ? ?  ?Precautions / Restrictions Precautions ?Precautions: Fall;Other (comment) ?Precaution Comments: monitor orthostatics; syncopal episodes ?Restrictions ?Weight Bearing Restrictions: No  ?  ? ?Mobility ? Bed Mobility ?Overal bed mobility: Needs Assistance ?Bed Mobility: Supine to Sit ?  ?  ?Supine to sit: Min assist ?  ?  ?General bed mobility comments:  minA for trunk elevation ?  ? ?Transfers ?Overall transfer level: Needs assistance ?Equipment used: Rolling Bayan Hedstrom (2 wheels) ?Transfers: Sit to/from Stand ?Sit to Stand: Min assist, From elevated surface ?  ?  ?  ?  ?  ?General transfer comment: minA to rise and steady ?  ? ?Ambulation/Gait ?Ambulation/Gait assistance: Min guard ?Gait Distance (Feet): 15 Feet (+15') ?Assistive device: Rolling Consetta Cosner (2 wheels) ?Gait Pattern/deviations: Step-through pattern, Decreased stride length ?Gait velocity: decreased ?  ?  ?General Gait Details: short ambulation distance within room due to lack of +2 for safe hallway ambulation. BP dropped from 132/92 in standing to 107/84 after short ambulation distance ? ? ?Stairs ?  ?  ?  ?  ?  ? ? ?Wheelchair Mobility ?  ? ?Modified Rankin (Stroke Patients Only) ?  ? ? ?  ?Balance Overall balance assessment: Needs assistance, History of Falls ?Sitting-balance support: No upper extremity supported, Feet supported ?Sitting balance-Leahy Scale: Good ?  ?  ?Standing balance support: Bilateral upper extremity supported, During functional activity ?Standing balance-Leahy Scale: Poor ?Standing balance comment: reliant on Rw ?  ?  ?  ?  ?  ?  ?  ?  ?  ?  ?  ?  ? ?  ?Cognition Arousal/Alertness: Awake/alert ?Behavior During Therapy: [facility] for tasks assessed/performed ?Overall Cognitive Status: History of cognitive impairments - at baseline ?  ?  ?  ?  ?  ?  ?  ?  ?  ?  ?  ?  ?  ?  ?  ?  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  Feel this is probably  baseline for since diagnosis. ?  ?  ? ?  ?Exercises   ? ?  ?General Comments   ?  ?  ? ?Pertinent Vitals/Pain Pain Assessment ?Pain Assessment: No/denies pain  ? ? ?Home Living   ?  ?  ?  ?  ?  ?  ?  ?  ?  ?   ?  ?Prior Function    ?  ?  ?   ? ?PT Goals (current goals can now be found in the care plan section) Acute Rehab PT Goals ?Patient Stated Goal: home ?PT Goal Formulation: With  patient ?Time For Goal Achievement: 01/24/22 ?Potential to Achieve Goals: Good ?Progress towards PT goals: Progressing toward goals ? ?  ?Frequency ? ? ? Min 3X/week ? ? ? ?  ?PT Plan Current plan remains appropriate  ? ? ?Co-evaluation   ?  ?  ?  ?  ? ?  ?AM-PAC PT "6 Clicks" Mobility   ?Outcome Measure ? Help needed turning from your back to your side while in a flat bed without using bedrails?: None ?Help needed moving from lying on your back to sitting on the side of a flat bed without using bedrails?: A Little ?Help needed moving to and from a bed to a chair (including a wheelchair)?: A Little ?Help needed standing up from a chair using your arms (e.g., wheelchair or bedside chair)?: A Little ?Help needed to walk in hospital room?: A Little ?Help needed climbing 3-5 steps with a railing? : A Lot ?6 Click Score: 18 ? ?  ?End of Session Equipment Utilized During Treatment: Gait belt ?Activity Tolerance: Patient tolerated treatment well ?Patient left: in chair;with call bell/phone within reach ?Nurse Communication: Mobility status ?PT Visit Diagnosis: Unsteadiness on feet (R26.81);Muscle weakness (generalized) (M62.81);History of falling (Z91.81);Other abnormalities of gait and mobility (R26.89) ?  ? ? ?Time: 6629-4765 ?PT Time Calculation (min) (ACUTE ONLY): 27 min ? ?Charges:  $Gait Training: 8-22 mins ?$Therapeutic Activity: 8-22 mins          ?          ? ?Patrece Tallie A. Gilford Rile, PT, DPT ?Acute Rehabilitation Services ?Pager [phone] ?Office [phone] ? ? ? ?Larayah Clute A Caelan Atchley ?01/11/2022, 5:25 PM ? ?

## 2022-01-11 NOTE — Progress Notes (Signed)
?  Echocardiogram ?2D Echocardiogram has been performed. ? ?Jeffrey [name] ?01/11/2022, 11:47 AM ?

## 2022-01-12 ENCOUNTER — Encounter: Payer: Self-pay | Admitting: Family Medicine

## 2022-01-12 DIAGNOSIS — I951 Orthostatic hypotension: Principal | ICD-10-CM

## 2022-01-12 DIAGNOSIS — R569 Unspecified convulsions: Secondary | ICD-10-CM | POA: Diagnosis not present

## 2022-01-12 DIAGNOSIS — Z789 Other specified health status: Secondary | ICD-10-CM | POA: Diagnosis not present

## 2022-01-12 DIAGNOSIS — D638 Anemia in other chronic diseases classified elsewhere: Secondary | ICD-10-CM | POA: Diagnosis not present

## 2022-01-12 LAB — COMPREHENSIVE METABOLIC PANEL
ALT: 48 U/L — ABNORMAL HIGH (ref 0–44)
AST: 46 U/L — ABNORMAL HIGH (ref 15–41)
Albumin: <1.5 g/dL — ABNORMAL LOW (ref 3.5–5.0)
Alkaline Phosphatase: 276 U/L — ABNORMAL HIGH (ref 38–126)
Anion gap: 6 (ref 5–15)
BUN: 21 mg/dL — ABNORMAL HIGH (ref 6–20)
CO2: 24 mmol/L (ref 22–32)
Calcium: 7.6 mg/dL — ABNORMAL LOW (ref 8.9–10.3)
Chloride: 108 mmol/L (ref 98–111)
Creatinine, Ser: 1.8 mg/dL — ABNORMAL HIGH (ref 0.61–1.24)
GFR, Estimated: 43 mL/min — ABNORMAL LOW (ref >60)
Glucose, Bld: 167 mg/dL — ABNORMAL HIGH (ref 70–99)
Potassium: 4.9 mmol/L (ref 3.5–5.1)
Sodium: 138 mmol/L (ref 135–145)
Total Bilirubin: 0.6 mg/dL (ref 0.3–1.2)
Total Protein: 5.6 g/dL — ABNORMAL LOW (ref 6.5–8.1)

## 2022-01-12 LAB — MAGNESIUM: Magnesium: 1.8 mg/dL (ref 1.7–2.4)

## 2022-01-12 LAB — GLUCOSE, CAPILLARY
Glucose-Capillary: 358 mg/dL — ABNORMAL HIGH (ref 70–99)
Glucose-Capillary: 159 mg/dL — ABNORMAL HIGH (ref 70–99)
Glucose-Capillary: 172 mg/dL — ABNORMAL HIGH (ref 70–99)
Glucose-Capillary: 217 mg/dL — ABNORMAL HIGH (ref 70–99)

## 2022-01-12 MED ORDER — COSYNTROPIN 0.25 MG IJ SOLR
0.2500 mg | Freq: Once | INTRAMUSCULAR | Status: AC
Start: 2022-01-13 — End: 2022-01-14
  Administered 2022-01-14: 0.25 mg via INTRAVENOUS
  Filled 2022-01-12 (×2): qty 0.25

## 2022-01-12 MED ORDER — INSULIN GLARGINE-YFGN 100 UNIT/ML ~~LOC~~ SOLN
5.0000 [IU] | Freq: Every day | SUBCUTANEOUS | Status: DC
  Filled 2022-01-12: qty 0.05

## 2022-01-12 MED ORDER — INSULIN GLARGINE-YFGN 100 UNIT/ML ~~LOC~~ SOLN
5.0000 [IU] | Freq: Once | SUBCUTANEOUS | Status: AC
  Administered 2022-01-12: 5 [IU] via SUBCUTANEOUS
  Filled 2022-01-12: qty 0.05

## 2022-01-12 MED ORDER — INSULIN GLARGINE-YFGN 100 UNIT/ML ~~LOC~~ SOLN: 5.0000 [IU] | Freq: Every day | SUBCUTANEOUS | Status: DC

## 2022-01-12 NOTE — Progress Notes (Addendum)
Family Medicine Teaching Service ?Daily Progress Note ?Intern Pager: [phone] ? ?Patient name: Jeffrey [name] Medical record number: [id number] ?Date of birth: 01/01/1966 Age: 56 y.o. Gender: male ? ?Primary Care Provider: [name], MD ?Consultants: None ?Code Status: Full ? ?Pt Overview and Major Events to Date:  ?4/4- admitted ? ?Assessment and Plan: ? ?Jeffrey [name] is a 56 y.o. male presenting with fall after an unwitnessed seizure at home, also with generalized weakness. PMH is significant for a seizure disorder, hx of cardiac arrest, HTN, DM2, hx of ETOH abuse, Depression, HLD, GERD ?  ?Pitting Edema  Hypoalbuminemia ?CKD IIIA ?Echo WNL making cardiac cause of edema less likely. UOP yesterday was 600 with 1 unmeasured occurrence. Patient abdominal US showed minimal ascites that was not treatable with paracentesis. Weight increased yesterday from 119.5 kg to 123.8 kg, this is likely inaccurate and 2/2 to measurement/recording error. Patient fluid status slightly improved. Cr results pending, after Lasix 40 mg yesterday.  ?-F/u BMP ?-Hold lasix ?-Ted Hose  ?-Diet supplementation per RD ?-Daily weights ?-Strict I/O's ? ?Orthostatic Hypotension  Generalized weakness ?History of abnormal ACTH stim test ?No signs of HF on echo yesterday, patient was EF 70-26% with no diastolic dysfunction. Patient with history of abnormal ACTH stim test during last admission. Patient to have ACTH stim test today, but was not performed. Will reschedule for tomorrow. Will also obtain orthostatics today, and encourage patient to sit in chair today. ?-F/u ACTH stim test ?-PT/OT  ?-Home Health PT/OT at d/c ?-Repeat orthostatics ?-To chair today  ? ? ?Klebsiella UTI ?Patient reports continued dysuria today, will continue to treat for UTI ?-Continue Duricef 500 mg BID day 2/7 ? ? ?Prolonged Qtc ?QTC 545 > 527 > 519, will obtain rpt EKG today  ?-F/u EKG ?-Avoid QT prolonging meds ? ? ?DM2 ?Stable, patient sugars 186- 285  yesterday and received 6 units of short acting insulin yesterday. CBG 358 this morning. Patient very sensitive to insulin, will prefer to keep sugars on higher side, than to overcorrect with insulin and make him hypoglycemic again.  ?-5 units lantus ?-Continue SSI ?-CBGs ? ?Transaminitis  EtOH abuse ?Pending CMP lab results ?-continue to monitor ? ?Chronic, stable conditions ?GERD - Protonix ?MDD - Cymbalta, Mirtazapine ?Normocytic anemia ? ?FEN/GI: Regular ?PPx: Lovenox ?Dispo:Home with home health  in 2-3 days.   ? ?Subjective:  ?Patient doing well this morning, but continues to have dysuria, swelling in hands improved.  ? ?Objective: ?Temp:  [97.6 ?F (36.4 ?C)-98.5 ?F (36.9 ?C)] 97.6 ?F (36.4 ?C) (04/08 0449) ?Pulse Rate:  [69-104] 69 (04/08 0449) ?Resp:  [17-19] 17 (04/08 0449) ?BP: (142-195)/(82-113) 167/108 (04/08 0449) ?SpO2:  [96 %-100 %] 100 % (04/08 0449) ?Weight:  [123.8 kg] 123.8 kg (04/07 2317) ?Physical Exam: ?General: Awake, alert, NAD  ?Cardiovascular: RRR, [place], ?Respiratory: CTABL, normal work of breathing ?Abdomen: Obese, Soft, NTTP, non-distended, no fluid wave ?Extremities: 1+ pitting edema to BLE, mild edema in BUE ? ?Laboratory: ?Recent Labs  ?Lab 01/08/22 ?2305 01/09/22 ?3785 01/10/22 ?8850  ?WBC 7.9 6.3 6.8  ?HGB 9.3* 8.0* 11.3*  ?HCT 29.4* 24.5* 35.0*  ?PLT 489* 426* 532*  ? ?Recent Labs  ?Lab 01/09/22 ?2774 01/10/22 ?1287 01/11/22 ?0253  ?NA 140 140 138  ?K 4.5 4.6 4.6  ?CL 109 109 109  ?CO2 '22 23 25  '$ ?BUN '18 18 20  '$ ?CREATININE 1.56* 1.80* 1.74*  ?CALCIUM 7.1* 7.6* 7.2*  ?PROT 4.3* 6.3* 4.6*  ?BILITOT 0.2* 0.4 0.7  ?ALKPHOS 178* 286* 215*  ?  ALT 46* 62* 44  ?AST 40 56* 37  ?GLUCOSE 85 123* 278*  ? ? ? ? ?Imaging/Diagnostic Tests: ? ? ?Holley Bouche, MD ?01/12/2022, 5:54 AM ?PGY-1, [place] [facility] ?[place] Intern pager: [phone], text pages welcome ? ?

## 2022-01-12 NOTE — Progress Notes (Signed)
Occupational Therapy Treatment ?Patient Details ?Name: Jeffrey [name] ?MRN: [id number] ?DOB: 1966/02/28 ?Today's Date: 01/12/2022 ? ? ?History of present illness 56 y/o male presented to ED on 01/08/22 following unwitnessed seizure and found on floor. Found to be hypothermic. Recently admitted 3/16-3/27 for syncope and orthostatic hypotension. PMH: HTN, seizure disorder, DM2, hx of ETOH abuse, depression, hx of ABI ?  ?OT comments ? Pt making progress towards goals this session, able to complete toilet transfer and UB dressing during session with set up - min guard A. Pt asymptomatic, however able to verbalize his symptoms of hypotension and what to do if he feels this way. Pt also able to verbalize step-by-step compensatory strategies for LB dressing with AE and shower transfer using tub bench. Educated pt on use of tub bench for safety upon returning home as he does not normally bathe seated. Pt verbalizes understanding. Pt presenting with impairments listed below, will follow acutely. Continue to recommend HHOT at d/c.  ? ?Recommendations for follow up therapy are one component of a multi-disciplinary discharge planning process, led by the attending physician.  Recommendations may be updated based on patient status, additional functional criteria and insurance authorization. ?   ?Follow Up Recommendations ? Home health OT  ?  ?Assistance Recommended at Discharge Intermittent Supervision/Assistance  ?Patient can return home with the following ? Assistance with cooking/housework;Assist for transportation;Help with stairs or ramp for entrance;A little help with bathing/dressing/bathroom;A little help with walking and/or transfers ?  ?Equipment Recommendations ? None recommended by OT  ?  ?Recommendations for Other Services   ? ?  ?Precautions / Restrictions Precautions ?Precautions: Fall;Other (comment) ?Precaution Comments: monitor orthostatics; syncopal episodes ?Restrictions ?Weight Bearing Restrictions: No  ? ? ?   ? ?Mobility Bed Mobility ?Overal bed mobility: Needs Assistance ?Bed Mobility: Supine to Sit, Sit to Supine ?  ?  ?Supine to sit: Supervision, HOB elevated ?Sit to supine: Min assist ?  ?General bed mobility comments: pt requires assist to elevate LE onto bed ?  ? ?Transfers ?Overall transfer level: Needs assistance ?Equipment used: Rollator (4 wheels) ?Transfers: Sit to/from Stand ?Sit to Stand: Supervision ?  ?  ?  ?  ?  ?  ?  ?  ?Balance Overall balance assessment: Needs assistance ?Sitting-balance support: No upper extremity supported, Feet supported ?Sitting balance-Leahy Scale: Good ?  ?  ?Standing balance support: Single extremity supported, Bilateral upper extremity supported ?Standing balance-Leahy Scale: Poor ?Standing balance comment: reliant on Rw ?  ?  ?  ?  ?  ?  ?  ?  ?  ?  ?  ?   ? ?ADL either performed or assessed with clinical judgement  ? ?ADL Overall ADL's : Needs assistance/impaired ?  ?  ?  ?  ?  ?  ?  ?  ?Upper Body Dressing : Set up;Sitting ?Upper Body Dressing Details (indicate cue type and reason): doffs gown from backside ?  ?  ?Toilet Transfer: Engineer, manufacturing (4 wheels) ?Toilet Transfer Details (indicate cue type and reason): simulated in room ?  ?  ?  ?  ?  ?General ADL Comments: able to verbalize step by step compensatory strateiges pt uses for dressing and bathing at home ?  ? ?Extremity/Trunk Assessment Upper Extremity Assessment ?Upper Extremity Assessment: Overall WFL for tasks assessed ?  ?Lower Extremity Assessment ?Lower Extremity Assessment: Defer to PT evaluation ?  ?  ?  ? ?Vision   ?Vision Assessment?: No apparent visual deficits ?  ?Perception Perception ?Perception: Not tested ?  ?  Praxis Praxis ?Praxis: Not tested ?  ? ?Cognition Arousal/Alertness: Awake/alert ?Behavior During Therapy: [facility] for tasks assessed/performed ?Overall Cognitive Status: History of cognitive impairments - at baseline ?  ?  ?  ?  ?  ?  ?  ?  ?  ?  ?  ?  ?  ?  ?  ?  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  Feel this is probably baseline for since diagnosis. ?  ?  ?   ?Exercises   ? ?  ?Shoulder Instructions   ? ? ?  ?General Comments pt asymptomatic with short distance ambulation in room, able to verbalize symptoms and what to do if symptoms arise  ? ? ?Pertinent Vitals/ Pain       Pain Assessment ?Pain Assessment: No/denies pain ? ?Home Living   ?  ?  ?  ?  ?  ?  ?  ?  ?  ?  ?  ?  ?  ?  ?  ?  ?  ?  ? ?  ?Prior Functioning/Environment    ?  ?  ?  ?   ? ?Frequency ? Min 2X/week  ? ? ? ? ?  ?Progress Toward Goals ? ?OT Goals(current goals can now be found in the care plan section) ? Progress towards OT goals: Progressing toward goals ? ?Acute Rehab OT Goals ?Patient Stated Goal: to go home ?OT Goal Formulation: With patient ?Time For Goal Achievement: 01/24/22 ?Potential to Achieve Goals: Good ?ADL Goals ?Pt Will Perform Grooming: with supervision;standing ?Pt Will Perform Lower Body Bathing: with supervision;sit to/from stand ?Pt Will Perform Lower Body Dressing: with supervision;sit to/from stand ?Pt Will Transfer to Toilet: with supervision;ambulating;bedside commode ?Pt Will Perform Toileting - Clothing Manipulation and hygiene: with supervision;sit to/from stand ?Pt Will Perform Tub/Shower Transfer: with supervision;rolling walker;tub bench;Tub transfer ?Additional ADL Goal #1: Pt to increase standing activity tolerance > 7 min implementing appropriate syncopal episode precautions prn ?Additional ADL Goal #2: Pt to gather ADL/IADL items MOD I  ?Plan Discharge plan remains appropriate;Frequency remains appropriate   ? ?Co-evaluation ? ? ?   ?  ?  ?  ?  ? ?  ?AM-PAC OT "6 Clicks" Daily Activity     ?Outcome Measure ? ? Help from another person eating meals?: None ?Help from another person taking care of personal grooming?: A Little ?Help from another person toileting, which includes using toliet, bedpan, or urinal?: A Little ?Help from another  person bathing (including washing, rinsing, drying)?: A Little ?Help from another person to put on and taking off regular upper body clothing?: None ?Help from another person to put on and taking off regular lower body clothing?: A Lot ?6 Click Score: 19 ? ?  ?End of Session Equipment Utilized During Treatment: Rollator (4 wheels) ? ?OT Visit Diagnosis: Unsteadiness on feet (R26.81);Repeated falls (R29.6);Muscle weakness (generalized) (M62.81) ?  ?Activity Tolerance Patient tolerated treatment well ?  ?Patient Left in bed;with call bell/phone within reach;with bed alarm set ?  ?Nurse Communication Mobility status ?  ? ?   ? ?Time: 0383-3383 ?OT Time Calculation (min): 40 min ? ?Charges: OT General Charges ?$OT Visit: 1 Visit ?OT Treatments ?$Self Care/Home Management : 38-52 mins ? ?Lynnda Child, OTD, OTR/L ?Acute Rehab ?[phone] ? ? ?Kaylyn Lim ?01/12/2022, 4:10 PM ?

## 2022-01-12 NOTE — Progress Notes (Signed)
FPTS Brief Progress Note ? ?S:Went to see patient, patient resting comfortably in bed, asleep. BP improved. ? ? ?O: ?BP (!) 142/82 (BP Location: Left Arm)   Pulse 84   Temp 97.9 ?F (36.6 ?C) (Oral)   Resp 18   Ht '5\' 10"'$  (1.778 m)   Wt 123.8 kg   SpO2 100%   BMI 39.16 kg/m?   ? ? ?A/P: ?Patient BP was sustained in 180's/110's. Patient was given a 1 time dose of metoprolol 25 mg, with corresponding improvement in BP. Patient sleeping comfortably in bed at this time, no concern for hypertensive emergency.  Will continue to monitor.  ?- Orders reviewed. Labs for AM ordered, which was adjusted as needed.  ?- Plans per day team ? ?Holley Bouche, MD ?01/12/2022, 1:39 AM ?PGY-1, [place] [facility] Night Resident  ?Please page [phone] with questions.  ? ? ?

## 2022-01-12 NOTE — Progress Notes (Signed)
Physical Therapy Treatment ?Patient Details ?Name: Jeffrey [name] ?MRN: [id number] ?DOB: 03-10-1966 ?Today's Date: 01/12/2022 ? ? ?History of Present Illness 56 y/o male presented to ED on 01/08/22 following unwitnessed seizure and found on floor. Found to be hypothermic. Recently admitted 3/16-3/27 for syncope and orthostatic hypotension. PMH: HTN, seizure disorder, DM2, hx of ETOH abuse, depression, hx of ABI ? ?  ?PT Comments  ? ? Pt tolerates treatment well, reporting mild dizziness after ambulating 100' and demonstrating the appropriate response of sitting on rollator until symptoms resolve. Pt reports an improving understanding of his symptoms related to orthostatic BP. Per pt report, the majority of his falls have occurred in his bedroom when he has abandoned his rollator outside of the room. Pt reports he knows he must keep his walker with him at all times not. Pt reports a desire to return home with HHPT. Pt understands the risk of future syncopal episodes due to his history of orthostatic hypotension. PT and pt discuss safety measures to reduce this risk including use of TED hose which PT assists in donning at the end of this session. Pt will benefit from continued acute PT services to aide in improving safety awareness. PT continues to recommend HHPT.   ?Recommendations for follow up therapy are one component of a multi-disciplinary discharge planning process, led by the attending physician.  Recommendations may be updated based on patient status, additional functional criteria and insurance authorization. ? ?Follow Up Recommendations ? Home health PT ?  ?  ?Assistance Recommended at Discharge Intermittent Supervision/Assistance  ?Patient can return home with the following A little help with walking and/or transfers;Assistance with cooking/housework;A little help with bathing/dressing/bathroom (assist for compression stockings) ?  ?Equipment Recommendations ? None recommended by PT  ?  ?Recommendations for  Other Services   ? ? ?  ?Precautions / Restrictions Precautions ?Precautions: Fall;Other (comment) ?Precaution Comments: monitor orthostatics; syncopal episodes ?Restrictions ?Weight Bearing Restrictions: No  ?  ? ?Mobility ? Bed Mobility ?Overal bed mobility: Needs Assistance ?Bed Mobility: Supine to Sit, Sit to Supine ?  ?  ?Supine to sit: Supervision, HOB elevated ?Sit to supine: Min assist ?  ?General bed mobility comments: pt requires assist to elevate LE onto bed ?  ? ?Transfers ?Overall transfer level: Needs assistance ?Equipment used: Rollator (4 wheels) ?Transfers: Sit to/from Stand ?Sit to Stand: Supervision ?  ?  ?  ?  ?  ?  ?  ? ?Ambulation/Gait ?Ambulation/Gait assistance: Min guard ?Gait Distance (Feet): 100 Feet (100' x 2, seated rest break due to onset of weakness and mild dizziness) ?Assistive device: Rolling walker (2 wheels) ?Gait Pattern/deviations: Step-through pattern ?Gait velocity: reduced ?Gait velocity interpretation: <1.8 ft/sec, indicate of risk for recurrent falls ?  ?General Gait Details: pt with slowed step-through gait ? ? ?Stairs ?  ?  ?  ?  ?  ? ? ?Wheelchair Mobility ?  ? ?Modified Rankin (Stroke Patients Only) ?  ? ? ?  ?Balance Overall balance assessment: Needs assistance ?Sitting-balance support: No upper extremity supported, Feet supported ?Sitting balance-Leahy Scale: Good ?  ?  ?Standing balance support: Single extremity supported, Bilateral upper extremity supported ?Standing balance-Leahy Scale: Poor ?Standing balance comment: reliant on Rw ?  ?  ?  ?  ?  ?  ?  ?  ?  ?  ?  ?  ? ?  ?Cognition Arousal/Alertness: Awake/alert ?Behavior During Therapy: [facility] for tasks assessed/performed ?Overall Cognitive Status: History of cognitive impairments - at baseline ?  ?  ?  ?  ?  ?  ?  ?  ?  ?  ?  ?  ?  ?  ?  ?  ?  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  Feel this is probably baseline for since diagnosis. ?  ?  ? ?  ?Exercises    ? ?  ?General Comments General comments (skin integrity, edema, etc.): pt with history of orthostatic hypotension. PT monitoring symptoms this session. Pt reports symptoms of weakness and mild dizziness when ambulating, deciding to sit on rollator to rest. Symptoms resolve. Pt reports no further instances of symptoms this session. ?  ?  ? ?Pertinent Vitals/Pain Pain Assessment ?Pain Assessment: No/denies pain  ? ? ?Home Living   ?  ?  ?  ?  ?  ?  ?  ?  ?  ?   ?  ?Prior Function    ?  ?  ?   ? ?PT Goals (current goals can now be found in the care plan section) Acute Rehab PT Goals ?Patient Stated Goal: home ?Progress towards PT goals: Progressing toward goals ? ?  ?Frequency ? ? ? Min 3X/week ? ? ? ?  ?PT Plan Current plan remains appropriate  ? ? ?Co-evaluation   ?  ?  ?  ?  ? ?  ?AM-PAC PT "6 Clicks" Mobility   ?Outcome Measure ? Help needed turning from your back to your side while in a flat bed without using bedrails?: None ?Help needed moving from lying on your back to sitting on the side of a flat bed without using bedrails?: A Little ?Help needed moving to and from a bed to a chair (including a wheelchair)?: A Little ?Help needed standing up from a chair using your arms (e.g., wheelchair or bedside chair)?: A Little ?Help needed to walk in hospital room?: A Little ?Help needed climbing 3-5 steps with a railing? : A Lot ?6 Click Score: 18 ? ?  ?End of Session   ?Activity Tolerance: Patient tolerated treatment well ?Patient left: in bed;with call bell/phone within reach;with bed alarm set ?Nurse Communication: Mobility status ?PT Visit Diagnosis: Unsteadiness on feet (R26.81);Muscle weakness (generalized) (M62.81);History of falling (Z91.81);Other abnormalities of gait and mobility (R26.89) ?  ? ? ?Time: 9371-6967 ?PT Time Calculation (min) (ACUTE ONLY): 67 min ? ?Charges:  $Gait Training: 8-22 mins ?$Therapeutic Activity: 23-37 mins ?$Self Care/Home Management: 8-22          ?          ? ?Zenaida Niece, PT,  DPT ?Acute Rehabilitation ?Pager: [phone] ?Office [phone] ? ? ? ?Zenaida Niece ?01/12/2022, 2:18 PM ? ?

## 2022-01-13 DIAGNOSIS — B9689 Other specified bacterial agents as the cause of diseases classified elsewhere: Secondary | ICD-10-CM | POA: Diagnosis not present

## 2022-01-13 DIAGNOSIS — I10 Essential (primary) hypertension: Secondary | ICD-10-CM | POA: Diagnosis not present

## 2022-01-13 DIAGNOSIS — I951 Orthostatic hypotension: Secondary | ICD-10-CM | POA: Diagnosis not present

## 2022-01-13 DIAGNOSIS — N390 Urinary tract infection, site not specified: Secondary | ICD-10-CM | POA: Diagnosis not present

## 2022-01-13 LAB — GLUCOSE, CAPILLARY
Glucose-Capillary: 77 mg/dL (ref 70–99)
Glucose-Capillary: 287 mg/dL — ABNORMAL HIGH (ref 70–99)
Glucose-Capillary: 228 mg/dL — ABNORMAL HIGH (ref 70–99)

## 2022-01-13 NOTE — Discharge Instructions (Addendum)
Dear [name],  ? ?Thank you so much for allowing [name] to be part of your care!  You were admitted to [facility] for a seizure, likely related to alcohol use. You were also treated for a urinary tract infection during your hospitalization. ? ? ?POST-HOSPITAL & CARE INSTRUCTIONS ?Do NOT drive for 6 months until you are seizure free ?Go to your lab visit on 4/14 at 1:45pm ?Call your PCP office to follow-up and discuss care with Dr. Valentina Lucks as well ?Follow-up with Endocrinology Dr. Buddy Duty (Office phone number [phone]) ?Please let PCP/Specialists know of any changes that were made.  ?Please see medications section of this packet for any medication changes.  ? ?DOCTOR'S APPOINTMENT & FOLLOW UP CARE INSTRUCTIONS  ?Future Appointments  ?Date Time Provider [place]  ?01/18/2022  1:45 PM [facility]  ? ? ?RETURN PRECAUTIONS: ?If you have significantly low sugar, are unable to walk, have a seizure, or feeling chest pain/pressure. ? ?Take care and be well! ? ?Family Medicine Teaching Service  ?[place]  ?[facility]  ?[address] [place], [place] [address] ?[phone] ? ?

## 2022-01-13 NOTE — Progress Notes (Addendum)
FMTS Attending Daily Note: ?[name], MD  ?Team Pager [phone] ?Pager [phone]  ?I have seen and examined this patient, reviewed their chart. I have discussed this patient with the resident physician. ? ?Addendums to below note include: ? ?Discussed driving restrictions at length.  ? ?I agree with the remainder of the findings, exam, and plan below.  ? ?Disposition: home with [facility] PT today .  ? ? ?Family Medicine Teaching Service ?Daily Progress Note ?Intern Pager: [phone] ? ?Patient name: Jeffrey [name] Medical record number: [id number] ?Date of birth: 03/19/1966 Age: 56 y.o. Gender: male ? ?Primary Care Provider: [name], MD ?Consultants: None ?Code Status: Full ? ?Pt Overview and Major Events to Date:  ?4/4 admitted ? ?Assessment and Plan: ?Jeffrey [name] is a 56 y.o. male presenting after presumed unwitnessed seizure, found to have UTI and generalized weakness. PMH is significant for a seizure disorder, hx of cardiac arrest, HTN, DM2, hx of ETOH abuse, Depression, HLD, GERD ? ?Hypotension, orthostatic, with resting hypertension. Long standing issue.  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  ACTH unable to be obtained multiple times.  We will arrange for outpatient endocrinology evaluation and consideration of outpatient MRI. ?Pitting edema stable, improved from admission. ?- TED hose ?- Awaiting a.m. labs ?- PT OT to see today ?- Discussed absolute  importance of using his walker at all times and wearing his TED hose. ? ?CKD IIIA ?- Awaiting a.m. labs for creatinine and GFR ? ?Klebsiella UTI ?- Continue Duricef 500 mg twice daily day 3/7 ? ?Seizure disorder ?Chronic, stable ?- Continue Keppra ? ?Prolonged Qtc ?Improving, QTc on EKG yesterday 509. ?- Avoid QT prolonging medications ? ?Type II DM ?Stable ?- Morning CBG, will discontinue insulin given hypoglycemia  ? ?Elevated liver enzymes  EtOH misuse ?Stable. ?- Awaiting a.m. CMP ?- Continue folate and thiamine ? ?GERD ?-  Protonix ? ?MDD ?- Cymbalta and mirtazapine ? ?FEN/GI: Regular ?PPx: Lovenox ?Dispo:Home with home health  today. Barriers include lab collection.  ? ?Subjective:  ?Patient reports that he is still having burning at the tip whenever he is urinating.  He denies any recent unprotected intercourse.  Patient has no other complaints morning and feels like his edema is stable. ? ?Objective: ?Temp:  [97.6 ?F (36.4 ?C)-98.4 ?F (36.9 ?C)] 97.8 ?F (36.6 ?C) (04/09 2979) ?Pulse Rate:  [70-81] 81 (04/09 0619) ?Resp:  [17-18] 17 (04/09 8921) ?BP: (140-168)/(89-99) 168/99 (04/09 1941) ?SpO2:  [100 %] 100 % (04/09 7408) ?Weight:  [122.6 kg] 122.6 kg (04/09 0500) ?Physical Exam: ?General: NAD, obese male sitting up in chair next to bed ?Cardiovascular: RRR, no murmur appreciated ?Respiratory: Clear to auscultation bilaterally, no increased work of breathing ?Abdomen: Soft, nontender, nondistended ?Extremities: 1+ pitting edema in BLE, trace edema in BUE (improved from admission) ? ?Laboratory: ?Recent Labs  ?Lab 01/08/22 ?2305 01/09/22 ?1448 01/10/22 ?1856  ?WBC 7.9 6.3 6.8  ?HGB 9.3* 8.0* 11.3*  ?HCT 29.4* 24.5* 35.0*  ?PLT 489* 426* 532*  ? ?Recent Labs  ?Lab 01/10/22 ?3149 01/11/22 ?0253 01/12/22 ?1342  ?NA 140 138 138  ?K 4.6 4.6 4.9  ?CL 109 109 108  ?CO2 '23 25 24  '$ ?BUN 18 20 21*  ?CREATININE 1.80* 1.74* 1.80*  ?CALCIUM 7.6* 7.2* 7.6*  ?PROT 6.3* 4.6* 5.6*  ?BILITOT 0.4 0.7 0.6  ?ALKPHOS 286* 215* 276*  ?ALT 62* 44 48*  ?AST 56* 37 46*  ?GLUCOSE 123* 278* 167*  ? ? ? ?Imaging/Diagnostic Tests: ?No results found.  ? ?  Rise Patience, DO ?01/13/2022, 6:51 AM ?PGY-2, [place] ?[place] Intern pager: [phone], text pages welcome ? ?

## 2022-01-13 NOTE — Progress Notes (Signed)
Went to bedside to talk with patient.  We are currently waiting on a creatinine given patient's AKI.  This morning phlebotomy was attempting to obtain labs unsuccessfully x3 tries and reported that they could not try again for another 24 hours.  Patient does not have reliable transportation and is not sure that he would be able to make an appointment in the clinic in the next 1 to 2 days.  Given this information, we will need to keep the patient until he is able to have labs drawn in showing no acute worsening of his creatinine.  Patient is agreeable and understanding to this. ? ? ? ?Aubery Date, DO  ?

## 2022-01-13 NOTE — Progress Notes (Signed)
FPTS Interim Night Progress Note ? ?S:Patient sleeping comfortably.  Rounded with primary night RN.  No concerns voiced.  No orders required.   ? ?O: ?Today's Vitals  ? 01/12/22 0936 01/12/22 1625 01/12/22 1959 01/12/22 2214  ?BP: (!) 153/93 140/89 (!) 151/98   ?Pulse: 80 70 79   ?Resp: '17 18 18   '$ ?Temp: 98.4 ?F (36.9 ?C) 98.3 ?F (36.8 ?C) 97.6 ?F (36.4 ?C)   ?TempSrc:   Oral   ?SpO2: 100% 100% 100%   ?Weight:    122.6 kg  ?Height:    '5\' 10"'$  (1.778 m)  ?PainSc:    0-No pain  ? ? ? ? ?A/P: ?Orthostatic hypotension: ACTH stimulation test this am.  Phlebotomy and RN aware of timed lab draws.   ?Continue current management ? ?Carollee Leitz MD ?PGY-3, [place] [facility] ?Service pager [phone]   ?

## 2022-01-13 NOTE — Progress Notes (Signed)
Occupational Therapy Treatment ?Patient Details ?Name: Jeffrey [name] ?MRN: [id number] ?DOB: 1966-01-01 ?Today's Date: 01/13/2022 ? ? ?History of present illness 56 y/o male presented to ED on 01/08/22 following unwitnessed seizure and found on floor. Found to be hypothermic. Recently admitted 3/16-3/27 for syncope and orthostatic hypotension. PMH: HTN, seizure disorder, DM2, hx of ETOH abuse, depression, hx of ABI ?  ?OT comments ? Patient progressing well with functional mobility and self care with goals updated. Patient was overall supervision level standing at sink for ~15 minutes to perform upper/lower body bathing (to lower legs, not including feet), oral care, and shaving. Patient able to perform transfers at supervision level with rollator and min cue for safety with hand placement. Patient very appreciative of therapy, feels it really helped him get back on his feet the last time he was ill. Acute OT to continue to follow.   ? ?Recommendations for follow up therapy are one component of a multi-disciplinary discharge planning process, led by the attending physician.  Recommendations may be updated based on patient status, additional functional criteria and insurance authorization. ?   ?Follow Up Recommendations ? Home health OT  ?  ?Assistance Recommended at Discharge Intermittent Supervision/Assistance  ?Patient can return home with the following ? Assistance with cooking/housework;Assist for transportation;Help with stairs or ramp for entrance;A little help with bathing/dressing/bathroom;A little help with walking and/or transfers ?  ?Equipment Recommendations ? None recommended by OT  ?  ?   ?Precautions / Restrictions Precautions ?Precautions: Fall;Other (comment) ?Precaution Comments: monitor orthostatics; syncopal episodes ?Restrictions ?Weight Bearing Restrictions: No  ? ? ?  ? ?Mobility Bed Mobility ?  ?  ?  ?  ?  ?  ?  ?General bed mobility comments: in recliner ?  ? ? ?  ?Balance Overall balance  assessment: Needs assistance ?Sitting-balance support: Feet supported ?Sitting balance-Leahy Scale: Good ?  ?  ?Standing balance support: No upper extremity supported, During functional activity ?Standing balance-Leahy Scale: Fair ?  ?  ?  ?  ?  ?  ?  ?  ?  ?  ?  ?  ?   ? ?ADL either performed or assessed with clinical judgement  ? ?ADL Overall ADL's : Needs assistance/impaired ?  ?  ?Grooming: Oral care;Wash/dry face;Wash/dry hands;Supervision/safety (shaving) ?Grooming Details (indicate cue type and reason): Patient stood at sink for ~15 minutes to complete g/h and bathing task with no loss of balance or episodes of dizziness. Does lean onto sink for support after increased time in standing. ?Upper Body Bathing: Supervision/ safety;Standing ?  ?Lower Body Bathing: Supervison/ safety;Sit to/from stand ?Lower Body Bathing Details (indicate cue type and reason): Patient wash perianal area, upper thighs and lower legs up to TED hoes while standing at sink. No loss of balance noted. ?  ?  ?  ?  ?Toilet Transfer: Supervision/safety;Ambulation;Rollator (4 wheels);Cueing for safety ?Toilet Transfer Details (indicate cue type and reason): Patient is able to power up to standing from recliner chair without physical assistance. Min cue to reach back for chair when sitting for improved eccentric control. ?  ?  ?  ?  ?Functional mobility during ADLs: Supervision/safety;Rollator (4 wheels) ?General ADL Comments: Patient  did quite well today during OT session tolerated extended dynamic standing at sink for bathing, g/h. No dizziness throughout. ?  ? ? ? ?Cognition Arousal/Alertness: Awake/alert ?Behavior During Therapy: [facility] for tasks assessed/performed ?Overall Cognitive Status: History of cognitive impairments - at baseline ?  ?  ?  ?  ?  ?  ?  ?  ?  ?  ?  ?  ?  ?  ?  ?  ?  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  Feel this is probably baseline for since diagnosis. ?  ?   ?   ?   ?   ?   ? ? ?Pertinent Vitals/ Pain       Pain Assessment ?Pain Assessment: No/denies pain ? ?   ?   ? ?Frequency ? Min 2X/week  ? ? ? ? ?  ?Progress Toward Goals ? ?OT Goals(current goals can now be found in the care plan section) ? Progress towards OT goals: Goals met and updated - see care plan ? ?Acute Rehab OT Goals ?Patient Stated Goal: Get strength back ?OT Goal Formulation: With patient ?Time For Goal Achievement: 01/24/22 ?Potential to Achieve Goals: Good ?ADL Goals ?Pt Will Perform Grooming: standing;with modified independence ?Pt Will Perform Lower Body Bathing: sit to/from stand;with modified independence ?Pt Will Perform Lower Body Dressing: sit to/from stand;with modified independence ?Pt Will Transfer to Toilet: ambulating;bedside commode;with modified independence ?Pt Will Perform Toileting - Clothing Manipulation and hygiene: sit to/from stand;with modified independence ?Pt Will Perform Tub/Shower Transfer: rolling walker;tub bench;Tub transfer;with modified independence ?Additional ADL Goal #1: Pt to increase standing activity tolerance > 15 min implementing appropriate syncopal episode precautions prn ?Additional ADL Goal #2: Pt to gather ADL/IADL items MOD I  ?Plan Discharge plan remains appropriate;Frequency remains appropriate   ? ?   ?AM-PAC OT "6 Clicks" Daily Activity     ?Outcome Measure ? ? Help from another person eating meals?: None ?Help from another person taking care of personal grooming?: A Little ?Help from another person toileting, which includes using toliet, bedpan, or urinal?: A Little ?Help from another person bathing (including washing, rinsing, drying)?: A Little ?Help from another person to put on and taking off regular upper body clothing?: None ?Help from another person to put on and taking off regular lower body clothing?: A Little ?6 Click Score: 20 ? ?  ?End of Session Equipment Utilized During Treatment: Rollator (4 wheels) ? ?OT Visit Diagnosis: Unsteadiness  on feet (R26.81);Repeated falls (R29.6);Muscle weakness (generalized) (M62.81) ?  ?Activity Tolerance Patient tolerated treatment well ?  ?Patient Left in chair;with call bell/phone within reach ?  ?Nurse Communication Mobility status ?  ? ?   ? ?Time: 0300-9233 ?OT Time Calculation (min): 33 min ? ?Charges: OT General Charges ?$OT Visit: 1 Visit ?OT Treatments ?$Self Care/Home Management : 23-37 mins ? ?Delbert Phenix OT ?OT pager: [phone] ? ? ?Rosemary Holms ?01/13/2022, 12:14 PM ?

## 2022-01-14 DIAGNOSIS — R531 Weakness: Secondary | ICD-10-CM | POA: Diagnosis not present

## 2022-01-14 DIAGNOSIS — R569 Unspecified convulsions: Secondary | ICD-10-CM | POA: Diagnosis not present

## 2022-01-14 DIAGNOSIS — D638 Anemia in other chronic diseases classified elsewhere: Secondary | ICD-10-CM | POA: Diagnosis not present

## 2022-01-14 DIAGNOSIS — K7030 Alcoholic cirrhosis of liver without ascites: Secondary | ICD-10-CM | POA: Diagnosis not present

## 2022-01-14 LAB — BASIC METABOLIC PANEL
Anion gap: 4 — ABNORMAL LOW (ref 5–15)
Anion gap: 5 (ref 5–15)
BUN: 23 mg/dL — ABNORMAL HIGH (ref 6–20)
BUN: 23 mg/dL — ABNORMAL HIGH (ref 6–20)
CO2: 19 mmol/L — ABNORMAL LOW (ref 22–32)
CO2: 23 mmol/L (ref 22–32)
Calcium: 6.8 mg/dL — ABNORMAL LOW (ref 8.9–10.3)
Calcium: 7.5 mg/dL — ABNORMAL LOW (ref 8.9–10.3)
Chloride: 111 mmol/L (ref 98–111)
Chloride: 107 mmol/L (ref 98–111)
Creatinine, Ser: 1.66 mg/dL — ABNORMAL HIGH (ref 0.61–1.24)
Creatinine, Ser: 1.8 mg/dL — ABNORMAL HIGH (ref 0.61–1.24)
GFR, Estimated: 48 mL/min — ABNORMAL LOW (ref >60)
GFR, Estimated: 43 mL/min — ABNORMAL LOW (ref >60)
Glucose, Bld: 292 mg/dL — ABNORMAL HIGH (ref 70–99)
Glucose, Bld: 311 mg/dL — ABNORMAL HIGH (ref 70–99)
Potassium: 6.2 mmol/L — ABNORMAL HIGH (ref 3.5–5.1)
Potassium: 5.8 mmol/L — ABNORMAL HIGH (ref 3.5–5.1)
Sodium: 134 mmol/L — ABNORMAL LOW (ref 135–145)
Sodium: 135 mmol/L (ref 135–145)

## 2022-01-14 LAB — ACTH STIMULATION, 3 TIME POINTS
Cortisol, 30 Min: 13.3 ug/dL
Cortisol, 60 Min: 16.3 ug/dL
Cortisol, Base: 9.9 ug/dL

## 2022-01-14 LAB — COMPREHENSIVE METABOLIC PANEL
ALT: 45 U/L — ABNORMAL HIGH (ref 0–44)
AST: 42 U/L — ABNORMAL HIGH (ref 15–41)
Albumin: <1.5 g/dL — ABNORMAL LOW (ref 3.5–5.0)
Alkaline Phosphatase: 268 U/L — ABNORMAL HIGH (ref 38–126)
Anion gap: 4 — ABNORMAL LOW (ref 5–15)
BUN: 22 mg/dL — ABNORMAL HIGH (ref 6–20)
CO2: 25 mmol/L (ref 22–32)
Calcium: 7.7 mg/dL — ABNORMAL LOW (ref 8.9–10.3)
Chloride: 106 mmol/L (ref 98–111)
Creatinine, Ser: 1.73 mg/dL — ABNORMAL HIGH (ref 0.61–1.24)
GFR, Estimated: 45 mL/min — ABNORMAL LOW (ref >60)
Glucose, Bld: 213 mg/dL — ABNORMAL HIGH (ref 70–99)
Potassium: 5.3 mmol/L — ABNORMAL HIGH (ref 3.5–5.1)
Sodium: 135 mmol/L (ref 135–145)
Total Bilirubin: 0.5 mg/dL (ref 0.3–1.2)
Total Protein: 5.7 g/dL — ABNORMAL LOW (ref 6.5–8.1)

## 2022-01-14 LAB — MAGNESIUM: Magnesium: 1.7 mg/dL (ref 1.7–2.4)

## 2022-01-14 LAB — GLUCOSE, CAPILLARY
Glucose-Capillary: 187 mg/dL — ABNORMAL HIGH (ref 70–99)
Glucose-Capillary: 286 mg/dL — ABNORMAL HIGH (ref 70–99)
Glucose-Capillary: 318 mg/dL — ABNORMAL HIGH (ref 70–99)
Glucose-Capillary: 220 mg/dL — ABNORMAL HIGH (ref 70–99)

## 2022-01-14 MED ORDER — FINASTERIDE 5 MG PO TABS
5.0000 mg | ORAL_TABLET | Freq: Every day | ORAL | Status: DC
  Administered 2022-01-14 – 2022-01-17 (×4): 5 mg via ORAL
  Filled 2022-01-14 (×4): qty 1

## 2022-01-14 MED ORDER — SODIUM ZIRCONIUM CYCLOSILICATE 10 G PO PACK: 10.0000 g | PACK | Freq: Once | ORAL | Status: DC

## 2022-01-14 MED ORDER — INSULIN ASPART 100 UNIT/ML IJ SOLN
3.0000 [IU] | Freq: Once | INTRAMUSCULAR | Status: AC
  Administered 2022-01-14: 3 [IU] via SUBCUTANEOUS

## 2022-01-14 MED ORDER — COSYNTROPIN 0.25 MG IJ SOLR
0.2500 mg | Freq: Once | INTRAMUSCULAR | Status: AC
  Administered 2022-01-15: 0.25 mg via INTRAVENOUS
  Filled 2022-01-14: qty 0.25

## 2022-01-14 MED ORDER — SODIUM ZIRCONIUM CYCLOSILICATE 10 G PO PACK
10.0000 g | PACK | Freq: Once | ORAL | Status: AC
  Administered 2022-01-14: 10 g via ORAL
  Filled 2022-01-14: qty 1

## 2022-01-14 NOTE — Progress Notes (Signed)
FPTS Brief Note ?Reviewed patient's vitals, recent notes.  ?Vitals:  ? 01/13/22 1010 01/13/22 2113  ?BP: (!) 176/109 (!) 154/104  ?Pulse: 83 92  ?Resp: 18 18  ?Temp: 97.6 ?F (36.4 ?C) 98.3 ?F (36.8 ?C)  ?SpO2: 100% 100%  ? ?At this time, no change in plan from day progress note.  ?Sencere Symonette, DO ?Page [phone] with questions about this patient.  ?  ?

## 2022-01-14 NOTE — Progress Notes (Addendum)
FPTS Brief Note ?Reviewed patient's vitals, recent notes.  ?Vitals:  ? 01/14/22 1828 01/14/22 2134  ?BP: (!) 185/106 (!) 188/110  ?Pulse: 90 92  ?Resp: 16 14  ?Temp: (!) 97.5 ?F (36.4 ?C) (!) 97.5 ?F (36.4 ?C)  ?SpO2: 100% 100%  ?Although elevated blood pressures, patient has severe orthostatics and drops significantly. Standing BP more accurate. Will continue to monitor.  ?At this time, no change in plan from day progress note.  ?Mikaeel Petrow, DO ?Page [phone] with questions about this patient.  ?  ?

## 2022-01-14 NOTE — Progress Notes (Signed)
?  Discussed with nurse regarding ensuring appropriate timing with lab so that both CMP and ACTH stim testing are obtained tomorrow. She says that she will make lab aware so these tests get done, appreciate her assistance.  ? Donney Dice, DO ?01/14/2022, 10:41 PM ?PGY-2, [place] ?Service pager [phone]  ?

## 2022-01-14 NOTE — Progress Notes (Signed)
?  K 6.2 and lokelma given during day shift. Repeat BMP notable for K 5.8. Reordered lokelma 10 mg and will recheck BMP in the morning.  ? Donney Dice, DO ?01/14/2022, 10:28 PM ?PGY-2, [place] ?Service pager [phone]  ?

## 2022-01-14 NOTE — Progress Notes (Signed)
Family Medicine Teaching Service ?Daily Progress Note ?Intern Pager: [phone] ? ?Patient name: Jeffrey [name] Medical record number: [id number] ?Date of birth: Mar 12, 1966 Age: 56 y.o. Gender: male ? ?Primary Care Provider: [name], MD ?Consultants: None ?Code Status: Full ? ?Pt Overview and Major Events to Date:  ?4/4 - Pt admitted ? ?Assessment and Plan: ? ?[name] is a 56 y.o. male presenting after presumed unwitnessed seizure, found to have UTI and generalized weakness. PMH is significant for a seizure disorder, hx of cardiac arrest, HTN, DM2, hx of ETOH abuse, Depression, HLD, GERD ?  ?Hypotension ?Hx of orthostatic hypotension, present on admission, but hypertensive overnight. Issue may be secondary to primary/secondary adrenal insufficiency. ACTH stim test to be performed today. We will arrange outpatient endocrinology follow up, and consider MRI. Pitting edema stable today.  ?-TED hose ?-ACTH stim test ?-PT/OT ? ?CKD IIIA ?Cr 1.73 today, slightly improved from yesterday at 1.8. GFR 45, slightly increased from yesterday at 43. Changes in kidney function may be 2/2 adrenal insufficiency issues. Will continue to monitor ?-AM CMP   ? ?Hyperkalemia ?Patient K 5.3 this morning. Considered lokelma, but given small increase, will attempt to correct with insulin, if CBG appropriate. ?-CBG check at 2 pm ?-Consider insulin ? ?Klebsiella UTI ?Continues to complain of dysuria today. Discomfort may be chronic, will continue to monitor. Will recommend DRE and GC/Chlamydia testing in outpatient.  ?-Continue Duricef 500 mg BI day 4/7 ? ?Prolonged Qtc ?Stable QTC 508 on 4/8.  ?-Consider EKG ?-Avoid QT prolonging medications ? ?Type II DM ?Stable. CBGs range 287-228 yesterday, with 5 units of short acting insulin given. Patient K is slightly elevated with sugar elevated to 286. Patient received 1 unit short acting insulin. Will reassess CBG this afternoon and consider giving more insulin, 1-3 units, to also  help lower K.  ?-Continue to monitor ? ?Elevated liver enzymes  EtOH misuse ?Improving, AST improved to 42 from 46. ALT improved to 45 from 46. Will continue to monitor ?-AM CMP ?-Continue folate and thiamine ? ?GERD ?-Protonix ? ?MDD ?-Cymbalta and mirtazapine ? ?Seizure disorder ?-Keppra  ? ?FEN/GI: Regular ?PPx: Lovenox ?Dispo:Home with home health  today.  ? ?Subjective:  ?Patient complaining of multiple fully formed stools overnight, ranging from 15-20, notes that his stools were not runny/diarrhea.  ? ?Objective: ?Temp:  [97.6 ?F (36.4 ?C)-98.3 ?F (36.8 ?C)] 98.3 ?F (36.8 ?C) (04/09 2113) ?Pulse Rate:  [81-92] 92 (04/09 2113) ?Resp:  [17-18] 18 (04/09 2113) ?BP: (154-176)/(99-109) 154/104 (04/09 2113) ?SpO2:  [100 %] 100 % (04/09 2113) ?Weight:  [78.9 kg] 78.9 kg (04/10 0130) ?Physical Exam: ?General: Well appearing, NAD, African American male, conversant ?Cardiovascular: RRR, NRMG ?Respiratory: CTABL, normal WOB ?Abdomen: Soft, NTTP, non-distended ?Extremities: 1+ pitting edema in BLE, trace edema in BUE ? ?Laboratory: ?Recent Labs  ?Lab 01/08/22 ?2305 01/09/22 ?9373 01/10/22 ?4287  ?WBC 7.9 6.3 6.8  ?HGB 9.3* 8.0* 11.3*  ?HCT 29.4* 24.5* 35.0*  ?PLT 489* 426* 532*  ? ?Recent Labs  ?Lab 01/10/22 ?6811 01/11/22 ?0253 01/12/22 ?1342  ?NA 140 138 138  ?K 4.6 4.6 4.9  ?CL 109 109 108  ?CO2 '23 25 24  '$ ?BUN 18 20 21*  ?CREATININE 1.80* 1.74* 1.80*  ?CALCIUM 7.6* 7.2* 7.6*  ?PROT 6.3* 4.6* 5.6*  ?BILITOT 0.4 0.7 0.6  ?ALKPHOS 286* 215* 276*  ?ALT 62* 44 48*  ?AST 56* 37 46*  ?GLUCOSE 123* 278* 167*  ? ? ? ? ?Imaging/Diagnostic Tests: ? ? ?Holley Bouche, MD ?01/14/2022,  5:49 AM ?PGY-1, [facility] ?[place] Intern pager: [phone], text pages welcome ? ?

## 2022-01-15 ENCOUNTER — Inpatient Hospital Stay (HOSPITAL_COMMUNITY): Payer: Commercial Managed Care - HMO

## 2022-01-15 DIAGNOSIS — R531 Weakness: Secondary | ICD-10-CM | POA: Diagnosis not present

## 2022-01-15 DIAGNOSIS — E1140 Type 2 diabetes mellitus with diabetic neuropathy, unspecified: Secondary | ICD-10-CM | POA: Diagnosis not present

## 2022-01-15 DIAGNOSIS — N390 Urinary tract infection, site not specified: Secondary | ICD-10-CM | POA: Diagnosis not present

## 2022-01-15 DIAGNOSIS — E875 Hyperkalemia: Secondary | ICD-10-CM

## 2022-01-15 LAB — BASIC METABOLIC PANEL
Anion gap: 5 (ref 5–15)
Anion gap: 8 (ref 5–15)
BUN: 23 mg/dL — ABNORMAL HIGH (ref 6–20)
BUN: 24 mg/dL — ABNORMAL HIGH (ref 6–20)
CO2: 24 mmol/L (ref 22–32)
CO2: 18 mmol/L — ABNORMAL LOW (ref 22–32)
Calcium: 7.4 mg/dL — ABNORMAL LOW (ref 8.9–10.3)
Calcium: 7.4 mg/dL — ABNORMAL LOW (ref 8.9–10.3)
Chloride: 105 mmol/L (ref 98–111)
Chloride: 106 mmol/L (ref 98–111)
Creatinine, Ser: 1.67 mg/dL — ABNORMAL HIGH (ref 0.61–1.24)
Creatinine, Ser: 1.71 mg/dL — ABNORMAL HIGH (ref 0.61–1.24)
GFR, Estimated: 47 mL/min — ABNORMAL LOW (ref >60)
GFR, Estimated: 46 mL/min — ABNORMAL LOW (ref >60)
Glucose, Bld: 380 mg/dL — ABNORMAL HIGH (ref 70–99)
Glucose, Bld: 451 mg/dL — ABNORMAL HIGH (ref 70–99)
Potassium: 5.4 mmol/L — ABNORMAL HIGH (ref 3.5–5.1)
Potassium: 5.8 mmol/L — ABNORMAL HIGH (ref 3.5–5.1)
Sodium: 134 mmol/L — ABNORMAL LOW (ref 135–145)
Sodium: 132 mmol/L — ABNORMAL LOW (ref 135–145)

## 2022-01-15 LAB — COMPREHENSIVE METABOLIC PANEL
ALT: 37 U/L (ref 0–44)
AST: 36 U/L (ref 15–41)
Albumin: <1.5 g/dL — ABNORMAL LOW (ref 3.5–5.0)
Alkaline Phosphatase: 235 U/L — ABNORMAL HIGH (ref 38–126)
Anion gap: 2 — ABNORMAL LOW (ref 5–15)
BUN: 22 mg/dL — ABNORMAL HIGH (ref 6–20)
CO2: 27 mmol/L (ref 22–32)
Calcium: 7.6 mg/dL — ABNORMAL LOW (ref 8.9–10.3)
Chloride: 109 mmol/L (ref 98–111)
Creatinine, Ser: 1.54 mg/dL — ABNORMAL HIGH (ref 0.61–1.24)
GFR, Estimated: 52 mL/min — ABNORMAL LOW (ref >60)
Glucose, Bld: 140 mg/dL — ABNORMAL HIGH (ref 70–99)
Potassium: 4.7 mmol/L (ref 3.5–5.1)
Sodium: 138 mmol/L (ref 135–145)
Total Bilirubin: 0.4 mg/dL (ref 0.3–1.2)
Total Protein: 4.8 g/dL — ABNORMAL LOW (ref 6.5–8.1)

## 2022-01-15 LAB — GLUCOSE, CAPILLARY
Glucose-Capillary: 140 mg/dL — ABNORMAL HIGH (ref 70–99)
Glucose-Capillary: 241 mg/dL — ABNORMAL HIGH (ref 70–99)
Glucose-Capillary: 377 mg/dL — ABNORMAL HIGH (ref 70–99)

## 2022-01-15 LAB — ACTH STIMULATION, 3 TIME POINTS
Cortisol, 30 Min: 15.1 ug/dL
Cortisol, 60 Min: 17.5 ug/dL
Cortisol, Base: 6.2 ug/dL

## 2022-01-15 MED ORDER — SODIUM ZIRCONIUM CYCLOSILICATE 10 G PO PACK
10.0000 g | PACK | Freq: Once | ORAL | Status: AC
  Administered 2022-01-16: 10 g via ORAL
  Filled 2022-01-15: qty 1

## 2022-01-15 MED ORDER — SODIUM ZIRCONIUM CYCLOSILICATE 10 G PO PACK
10.0000 g | PACK | Freq: Once | ORAL | Status: AC
  Administered 2022-01-15: 10 g via ORAL
  Filled 2022-01-15: qty 1

## 2022-01-15 MED ORDER — HEPARIN SODIUM (PORCINE) 5000 UNIT/ML IJ SOLN
5000.0000 [IU] | Freq: Three times a day (TID) | INTRAMUSCULAR | Status: DC
  Administered 2022-01-15 – 2022-01-17 (×6): 5000 [IU] via SUBCUTANEOUS
  Filled 2022-01-15 (×5): qty 1

## 2022-01-15 MED ORDER — INSULIN ASPART 100 UNIT/ML IJ SOLN
3.0000 [IU] | Freq: Once | INTRAMUSCULAR | Status: AC
  Administered 2022-01-15: 3 [IU] via SUBCUTANEOUS

## 2022-01-15 NOTE — Progress Notes (Signed)
Inpatient Diabetes Program Recommendations ? ?AACE/ADA: New Consensus Statement on Inpatient Glycemic Control (2015) ? ?Target Ranges:  Prepandial:   less than 140 mg/dL ?     Peak postprandial:   less than 180 mg/dL (1-2 hours) ?     Critically ill patients:  140 - 180 mg/dL  ? ?Lab Results  ?Component Value Date  ? GLUCAP 140 (H) 01/15/2022  ? HGBA1C 7.8 (A) 11/19/2021  ? ? ?Review of Glycemic Control ? Latest Reference Range & Units 01/14/22 07:36 01/14/22 11:44 01/14/22 16:53 01/14/22 21:37 01/15/22 07:22  ?Glucose-Capillary 70 - 99 mg/dL 187 (H) 286 (H) 318 (H) 220 (H) 140 (H)  ? ?Diabetes history: DM 2 ?Outpatient Diabetes medications:  ?Trulicity 3.41 mg weekly, Metformin-XR 500 mg bid ?Current orders for Inpatient glycemic control:  ?Novolog 0-6 units tid with meals ? ?Inpatient Diabetes Program Recommendations:   ? ?Note blood sugars increased after meals?  Consider adding Novolog meal coverage 2 units tid with meals (hold if patient eats less than 50%).  ? ?Thanks,  ?Adah Perl, RN, BC-ADM ?Inpatient Diabetes Coordinator ?Pager [phone]  (8a-5p) ? ? ?

## 2022-01-15 NOTE — Progress Notes (Signed)
?  Hyperkalemia of 5.8 notable on most recent BMP tonight, ordered lokelma and informed nurse of updated plan. Will recheck BMP tomorrow morning.  ? Donney Dice, DO ?01/15/2022, 10:48 PM ?PGY-2, [place] ?Service pager [phone]  ?

## 2022-01-15 NOTE — Progress Notes (Signed)
Family Medicine Teaching Service ?Daily Progress Note ?Intern Pager: [phone] ? ?Patient name: Jeffrey [name] Medical record number: [id number] ?Date of birth: 30-May-1966 Age: 56 y.o. Gender: male ? ?Primary Care Provider: [name], MD ?Consultants: None ?Code Status: Full Code ? ?Pt Overview and Major Events to Date:  ?4/4 - Pt admitted ? ?Assessment and Plan: ? ?Jeffrey [name] is a 56 y.o. male presenting after presumed unwitnessed seizure, found to have UTI and generalized weakness. PMH is significant for a seizure disorder, hx of cardiac arrest, HTN, DM2, hx of ETOH abuse, Depression, HLD, GERD ?  ?Hypotension ?Patient BP 139/88 this morning, but was as high as 188/110 overnight. ACTH stim test to be performed today. Pitting edema stable at level of knees, and remains improved in hands. Patient reports wearing TED hose intermittently 2/2 difficulty placing them on.  ? ?ACTH stim test resulted with: ?Base level of cortisol of 6.2,  ?Cortisol 30 min post cosyntropin was 15.1,  ?Cortisol post 60 post cosyntropin min was 17.5 ?Patient has cortisol < 18 after cosyntropin and is suggestive of adrenal insufficiency. Unsure if primary or secondary at this time. Will recommend outpatient follow up with Endocrinology.  ?-TED  hose ?-PT/OT ?-Referral to Endocrine at d/c ? ?CKD IIIA ?Cr improved to 1.54 from 1.80 overnight. Renal US showed normal appearing kidneys and mild ascites. Will continue to monitor. AKI thought to be 2/2 renal dysfunction with adrenal insufficiency.  ?-AM BMP ? ?Hyperkalemia ?Patient continues to have hyperkalemia, with K reaching 6.1 and 5.9 overnight, requiring 2 doses of lokelma. K this morning 4.7. Patient hyperkalemia likely 2/2 kidney dysfunction.  ?-AM BMP & BMP at 3 pm ? ?Klebsiella UTI ?Patient notes dysuria is improving today. Discomfort may also be 2/2 to chronic issues, will recommend outpatient follow up with DRE, GC/Chlamydia ?-Continue Duricef 500 mg BID day 5/7 ? ?Type II  DM ?Patient sugars were poorly controlled yesterday, ranging from 187-318. Patient received 10 units of insulin yesterday. Patient sensitive to insulin and at risk for hypoglycemia, will not adjust DM plan. ?-Continue to monitor ? ?Elevated liver enzymes  EtOH misuse ?Have been improving, AST normalized at 36 with ALT normalized at 37. ?-Continue folate and thiamine ? ?Prolonged Qtc ?Stable, last QTC 508 on 4/8 ? ?GERD ?-Protonix  ? ?MDD ?-Cymbalta and mirtazapine ? ?Seizure disorder ?-Keppra ? ? ?FEN/GI: Regular diet ?PPx: Lovenox ?Dispo:Home with home health  in 2-3 days. Barriers include hyperkalemia.  ? ?Subjective:  ?Patient reports he's feeling well today, with no complaints. Reports the dysuria is improving.  ? ?Objective: ?Temp:  [97.4 ?F (36.3 ?C)-98 ?F (36.7 ?C)] 98 ?F (36.7 ?C) (04/11 3382) ?Pulse Rate:  [84-92] 84 (04/11 0523) ?Resp:  [14-18] 15 (04/11 0523) ?BP: (139-188)/(88-110) 139/88 (04/11 0523) ?SpO2:  [99 %-100 %] 99 % (04/11 0523) ?Physical Exam: ?General: Well appearing, obese, NAD, African American male ?Cardiovascular: RRR, NRMG ?Respiratory: CTABL ?Abdomen: Soft, non-distended, NTTP ?Extremities: 1+ pitting edema in BLE to level of knee, BUE without edema ? ?Laboratory: ?Recent Labs  ?Lab 01/08/22 ?2305 01/09/22 ?5053 01/10/22 ?9767  ?WBC 7.9 6.3 6.8  ?HGB 9.3* 8.0* 11.3*  ?HCT 29.4* 24.5* 35.0*  ?PLT 489* 426* 532*  ? ?Recent Labs  ?Lab 01/12/22 ?1342 01/14/22 ?3419 01/14/22 ?1247 01/14/22 ?1902 01/15/22 ?3790  ?NA 138 135 134* 135 PENDING  ?K 4.9 5.3* 6.2* 5.8* 4.7  ?CL 108 106 111 107 PENDING  ?CO2 24 25 19* 23 PENDING  ?BUN 21* 22* 23* 23* 22*  ?CREATININE 1.80*  1.73* 1.66* 1.80* 1.54*  ?CALCIUM 7.6* 7.7* 6.8* 7.5* 7.6*  ?PROT 5.6* 5.7*  --   --  4.8*  ?BILITOT 0.6 0.5  --   --  0.4  ?ALKPHOS 276* 268*  --   --  235*  ?ALT 48* 45*  --   --  37  ?AST 46* 42*  --   --  36  ?GLUCOSE 167* 213* 292* 311* 140*  ? ? ? ? ?Imaging/Diagnostic Tests: ? ? ?Holley Bouche, MD ?01/15/2022, 6:25  AM ?PGY-1, [facility] ?[place] Intern pager: [phone], text pages welcome ? ?

## 2022-01-15 NOTE — Progress Notes (Signed)
Physical Therapy Treatment ?Patient Details ?Name: Jeffrey [name] ?MRN: [id number] ?DOB: 08-Jul-1966 ?Today's Date: 01/15/2022 ? ? ?History of Present Illness 56 y/o male presented to ED on 01/08/22 following unwitnessed seizure and found on floor. Found to be hypothermic. Recently admitted 3/16-3/27 for syncope and orthostatic hypotension. PMH: HTN, seizure disorder, DM2, hx of ETOH abuse, depression, hx of ABI ? ?  ?PT Comments  ? ? Continuing work on functional mobility and activity tolerance;  Session focused on progressive ambulation, and Jeffrey [name] had no prsyncopal symptoms while we walked in the hallway (at least tripling his previous distance); TED hose seem to be doing the trick; Pt tells me he plans to go to Outpt PT when done with his HH course, and this PT heartily agrees  ?Recommendations for follow up therapy are one component of a multi-disciplinary discharge planning process, led by the attending physician.  Recommendations may be updated based on patient status, additional functional criteria and insurance authorization. ? ?Follow Up Recommendations ? Home health PT ?  ?  ?Assistance Recommended at Discharge Intermittent Supervision/Assistance  ?Patient can return home with the following A little help with walking and/or transfers;Assistance with cooking/housework;A little help with bathing/dressing/bathroom (assist for compression stockings) ?  ?Equipment Recommendations ? None recommended by PT  ?  ?Recommendations for Other Services   ? ? ?  ?Precautions / Restrictions Precautions ?Precautions: Fall;Other (comment) ?Precaution Comments: monitor orthostatics; syncopal episodes -- improved with TED hose, and cues to self-monitor  ?  ? ?Mobility ? Bed Mobility ?Overal bed mobility: Modified Independent ?Bed Mobility: Supine to Sit ?  ?  ?Supine to sit: Modified independent (Device/Increase time) ?  ?  ?General bed mobility comments: Incr time, but no need for phsyical assist ?  ? ?Transfers ?Overall  transfer level: Needs assistance ?Equipment used: Rollator (4 wheels) ?Transfers: Sit to/from Stand ?Sit to Stand: Supervision ?  ?  ?  ?  ?  ?General transfer comment: Stands from elevated bed; good hand placement and Rollator managemetn ?  ? ?Ambulation/Gait ?Ambulation/Gait assistance: Min guard ?Gait Distance (Feet): 200 Feet (x2; one seated break) ?Assistive device: Rollator (4 wheels) ?Gait Pattern/deviations: Step-through pattern ?Gait velocity: reduced ?  ?  ?General Gait Details: Slow steps, but overall improved from last admission; Cues to self-monitor for activity tolerance ; Took one seated break, not because of feeling lightheaded, but mroe because he wanted to practice rollator management ? ? ?Stairs ?  ?  ?  ?  ?  ? ? ?Wheelchair Mobility ?  ? ?Modified Rankin (Stroke Patients Only) ?  ? ? ?  ?Balance   ?  ?Sitting balance-Leahy Scale: Good ?  ?  ?  ?Standing balance-Leahy Scale: Fair ?Standing balance comment: reliant on Rw ?  ?  ?  ?  ?  ?  ?  ?  ?  ?  ?  ?  ? ?  ?Cognition Arousal/Alertness: Awake/alert ?Behavior During Therapy: [facility] for tasks assessed/performed ?Overall Cognitive Status: History of cognitive impairments - at baseline ?  ?  ?  ?  ?  ?  ?  ?  ?  ?  ?  ?  ?  ?  ?  ?  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  Feel this is probably baseline for since diagnosis. ?  ?  ? ?  ?Exercises   ? ?  ?General Comments General comments (skin integrity, edema, etc.): No lightheadedness during pregressive amb; able to state  what to do if presyncopal symptoms arise; We discussed further actions to take to prevent falls, and pt promises to take his Rollaotr with him wherever he goes ?  ?  ? ?Pertinent Vitals/Pain Pain Assessment ?Pain Assessment: No/denies pain  ? ? ?Home Living   ?  ?  ?  ?  ?  ?  ?  ?  ?  ?   ?  ?Prior Function    ?  ?  ?   ? ?PT Goals (current goals can now be found in the care plan section) Acute Rehab PT Goals ?Patient  Stated Goal: home ?PT Goal Formulation: With patient ?Time For Goal Achievement: 01/24/22 ?Potential to Achieve Goals: Good ?Progress towards PT goals: Progressing toward goals ? ?  ?Frequency ? ? ? Min 3X/week ? ? ? ?  ?PT Plan Current plan remains appropriate  ? ? ?Co-evaluation   ?  ?  ?  ?  ? ?  ?AM-PAC PT "6 Clicks" Mobility   ?Outcome Measure ? Help needed turning from your back to your side while in a flat bed without using bedrails?: None ?Help needed moving from lying on your back to sitting on the side of a flat bed without using bedrails?: A Little ?Help needed moving to and from a bed to a chair (including a wheelchair)?: A Little ?Help needed standing up from a chair using your arms (e.g., wheelchair or bedside chair)?: A Little ?Help needed to walk in hospital room?: A Little ?Help needed climbing 3-5 steps with a railing? : A Lot ?6 Click Score: 18 ? ?  ?End of Session Equipment Utilized During Treatment: Gait belt ?Activity Tolerance: Patient tolerated treatment well ?Patient left: in chair;with call bell/phone within reach;with chair alarm set ?Nurse Communication: Mobility status ?PT Visit Diagnosis: Unsteadiness on feet (R26.81);Muscle weakness (generalized) (M62.81);History of falling (Z91.81);Other abnormalities of gait and mobility (R26.89) ?  ? ? ?Time: 3086-5784 ?PT Time Calculation (min) (ACUTE ONLY): 33 min ? ?Charges:  $Gait Training: 23-37 mins          ?          ? ?Roney Marion, PT  ?Acute Rehabilitation Services ?Office [phone] ? ? ? ?Colletta [place] ?01/15/2022, 2:37 PM ? ?

## 2022-01-16 ENCOUNTER — Ambulatory Visit: Payer: 59 | Admitting: Family Medicine

## 2022-01-16 ENCOUNTER — Other Ambulatory Visit (HOSPITAL_COMMUNITY): Payer: Self-pay

## 2022-01-16 DIAGNOSIS — E1140 Type 2 diabetes mellitus with diabetic neuropathy, unspecified: Secondary | ICD-10-CM | POA: Diagnosis not present

## 2022-01-16 DIAGNOSIS — I951 Orthostatic hypotension: Secondary | ICD-10-CM | POA: Diagnosis not present

## 2022-01-16 DIAGNOSIS — I10 Essential (primary) hypertension: Secondary | ICD-10-CM | POA: Diagnosis not present

## 2022-01-16 DIAGNOSIS — Z789 Other specified health status: Secondary | ICD-10-CM | POA: Diagnosis not present

## 2022-01-16 LAB — CBC
HCT: 29.5 % — ABNORMAL LOW (ref 39.0–52.0)
Hemoglobin: 9.2 g/dL — ABNORMAL LOW (ref 13.0–17.0)
MCH: 26.2 pg (ref 26.0–34.0)
MCHC: 31.2 g/dL (ref 30.0–36.0)
MCV: 84 fL (ref 80.0–100.0)
Platelets: 290 10*3/uL (ref 150–400)
RBC: 3.51 MIL/uL — ABNORMAL LOW (ref 4.22–5.81)
RDW: 25.8 % — ABNORMAL HIGH (ref 11.5–15.5)
WBC: 5.8 10*3/uL (ref 4.0–10.5)
nRBC: 0 % (ref 0.0–0.2)

## 2022-01-16 LAB — BASIC METABOLIC PANEL
Anion gap: 4 — ABNORMAL LOW (ref 5–15)
Anion gap: 4 — ABNORMAL LOW (ref 5–15)
BUN: 23 mg/dL — ABNORMAL HIGH (ref 6–20)
BUN: 24 mg/dL — ABNORMAL HIGH (ref 6–20)
CO2: 22 mmol/L (ref 22–32)
CO2: 24 mmol/L (ref 22–32)
Calcium: 7.6 mg/dL — ABNORMAL LOW (ref 8.9–10.3)
Calcium: 7.8 mg/dL — ABNORMAL LOW (ref 8.9–10.3)
Chloride: 108 mmol/L (ref 98–111)
Chloride: 107 mmol/L (ref 98–111)
Creatinine, Ser: 1.63 mg/dL — ABNORMAL HIGH (ref 0.61–1.24)
Creatinine, Ser: 1.62 mg/dL — ABNORMAL HIGH (ref 0.61–1.24)
GFR, Estimated: 49 mL/min — ABNORMAL LOW (ref >60)
GFR, Estimated: 49 mL/min — ABNORMAL LOW (ref >60)
Glucose, Bld: 366 mg/dL — ABNORMAL HIGH (ref 70–99)
Glucose, Bld: 281 mg/dL — ABNORMAL HIGH (ref 70–99)
Potassium: 5.1 mmol/L (ref 3.5–5.1)
Potassium: 5 mmol/L (ref 3.5–5.1)
Sodium: 134 mmol/L — ABNORMAL LOW (ref 135–145)
Sodium: 135 mmol/L (ref 135–145)

## 2022-01-16 LAB — GLUCOSE, CAPILLARY
Glucose-Capillary: 322 mg/dL — ABNORMAL HIGH (ref 70–99)
Glucose-Capillary: 296 mg/dL — ABNORMAL HIGH (ref 70–99)
Glucose-Capillary: 299 mg/dL — ABNORMAL HIGH (ref 70–99)

## 2022-01-16 MED ORDER — INSULIN ASPART 100 UNIT/ML IJ SOLN
0.0000 [IU] | Freq: Every day | INTRAMUSCULAR | Status: DC
  Administered 2022-01-16: 3 [IU] via SUBCUTANEOUS

## 2022-01-16 MED ORDER — INSULIN GLARGINE-YFGN 100 UNIT/ML ~~LOC~~ SOLN
3.0000 [IU] | Freq: Every day | SUBCUTANEOUS | Status: DC
  Administered 2022-01-16: 3 [IU] via SUBCUTANEOUS
  Filled 2022-01-16 (×2): qty 0.03

## 2022-01-16 MED ORDER — INSULIN ASPART 100 UNIT/ML IJ SOLN
0.0000 [IU] | Freq: Three times a day (TID) | INTRAMUSCULAR | Status: DC
  Administered 2022-01-16 (×2): 5 [IU] via SUBCUTANEOUS
  Administered 2022-01-17: 3 [IU] via SUBCUTANEOUS

## 2022-01-16 NOTE — Progress Notes (Signed)
FPTS Brief Note ?Reviewed patient's vitals, recent notes.  ?Vitals:  ? 01/15/22 1709 01/15/22 1956  ?BP: (!) 150/105 (!) 149/94  ?Pulse: 99 100  ?Resp: 16 18  ?Temp: 98.8 ?F (37.1 ?C) 98.5 ?F (36.9 ?C)  ?SpO2: 99% 100%  ? ?At this time, no change in plan from day progress note.  ?Fronie Holstein, DO ?Page [phone] with questions about this patient.  ?  ?

## 2022-01-16 NOTE — Plan of Care (Signed)

## 2022-01-16 NOTE — TOC Progression Note (Signed)
Transition of Care (TOC) - Progression Note  ? ? ?Patient Details  ?Name: Jeffrey [name] ?MRN: [id number] ?Date of Birth: Aug 14, 1966 ? ?Transition of Care (TOC) CM/SW Contact  ?Tom-Johnson, Renea Ee, RN ?Phone Number: ?01/16/2022, 2:35 PM ? ?Clinical Narrative:    ? ?CM spoke with patient about needs for discharge disposition. Home health PT/OT recommended and patient chose [facility] from list of agencies from [email or web address]. Referral called in to [facility] and acceptance voiced. Information on AVS.  ?CM will continue to follow with needs. ? ?Expected Discharge Plan: [place] ?Barriers to Discharge: Continued Medical Work up ? ?Expected Discharge Plan and Services ?Expected Discharge Plan: [place] ?  ?Discharge Planning Services: CM Consult ?Post Acute Care Choice: Home Health ?Living arrangements for the past 2 months: [place] ?                ?  ?  ?  ?  ?  ?  ?[place] Agency: [facility] ?Date HH Agency Contacted: 01/15/22 ?Time [place]: 1000 ?Representative spoke with at [place]: Tommi Rumps ? ? ?Social Determinants of Health (SDOH) Interventions ?  ? ?Readmission Risk Interventions ? ?  12/21/2021  ?  3:10 PM 09/17/2021  ? 12:59 PM 08/01/2021  ?  1:24 PM  ?Readmission Risk Prevention Plan  ?Transportation Screening Complete Complete Complete  ?PCP or Specialist Appt within 5-7 Days   Complete  ?PCP or Specialist Appt within 3-5 Days Complete Complete   ?Home Care Screening   Patient refused  ?Medication Review (RN CM)   Complete  ?[place] or Home Care Consult Complete Complete   ?Social Work Consult for [place] Planning/Counseling Complete Complete   ?Palliative Care Screening Not Applicable Not Applicable   ?Medication Review Press photographer) Complete Complete   ? ? ?

## 2022-01-16 NOTE — Progress Notes (Signed)
Interim Progress Note ? ?Discussed with Dr. Buddy Duty (Endocrinologist) due to concerns regarding potential adrenal insufficiency in this patient given his recurrent hyperkalemic episodes, intermittent hypoglycemia and orthostasis. He recommends checking ACTH level. If primary adrenal insufficiency this would be elevated. If secondary/tertiary then this would be low or normal. Also recommends checking Renin/Aldosterone level around 6-8AM. Given his diabetes, he suggests checking GAD-65 antibodies to assess Type 1 (would be positive) vs Type 2 (would be negative). If findings suggestive of adrenal insufficiency he would recommend treatment with Hydrocortisone. Would treat with fludrocortisone if primary renal insufficiency. He would recommend treating adrenal insufficiency prior to treating for potential thyroid disorders as levothyroxine accelerates the metabolism of hydrocortisone.  He is happy to discuss any further concerns when labs return and would be willing to see patient outpatient if patient is willing to follow up. We appreciate Dr. Cindra Eves assistance.  ?

## 2022-01-16 NOTE — Progress Notes (Signed)
PIV consult: previous PIV became dislodged overnight. Discussed with RN: recommend waiting until IV meds are ordered before inserting new PIV in order to preserve veins. ?

## 2022-01-16 NOTE — Progress Notes (Signed)
Occupational Therapy Treatment ?Patient Details ?Name: Jeffrey [name] ?MRN: [id number] ?DOB: Aug 18, 1966 ?Today's Date: 01/16/2022 ? ? ?History of present illness 56 y/o male presented to ED on 01/08/22 following unwitnessed seizure and found on floor. Found to be hypothermic. Recently admitted 3/16-3/27 for syncope and orthostatic hypotension. PMH: HTN, seizure disorder, DM2, hx of ETOH abuse, depression, hx of ABI ?  ?OT comments ? Pt making steady progress towards OT goals this session. Pt able to progress functional ambulation distance this session in order to facilitate improvements in overall activity tolerance and endurance for higher level ADL participation. Pt completed functional ambulation with rollator with min guard- supervision. Extensive education provided on AE for home such as reacher, TTB, compression hose donner etc. Pt very receptive to all education and overall OT session. Pt would continue to benefit from skilled occupational therapy while admitted and after d/c to address the below listed limitations in order to improve overall functional mobility and facilitate independence with BADL participation. DC plan remains appropriate, will follow acutely per POC.  ? ?  ? ?Recommendations for follow up therapy are one component of a multi-disciplinary discharge planning process, led by the attending physician.  Recommendations may be updated based on patient status, additional functional criteria and insurance authorization. ?   ?Follow Up Recommendations ? Home health OT  ?  ?Assistance Recommended at Discharge Intermittent Supervision/Assistance  ?Patient can return home with the following ? Assistance with cooking/housework;Assist for transportation;Help with stairs or ramp for entrance;A little help with bathing/dressing/bathroom;A little help with walking and/or transfers ?  ?Equipment Recommendations ? None recommended by OT  ?  ?Recommendations for Other Services   ? ?  ?Precautions / Restrictions  Precautions ?Precautions: Fall ?Precaution Comments: monitor orthostatics; syncopal episodes -- improved with TED hose, and cues to self-monitor ?Restrictions ?Weight Bearing Restrictions: No  ? ? ?  ? ?Mobility Bed Mobility ?  ?  ?  ?  ?  ?  ?  ?General bed mobility comments: in recliner ?  ? ?Transfers ?Overall transfer level: Needs assistance ?Equipment used: Rollator (4 wheels) ?Transfers: Sit to/from Stand ?Sit to Stand: Supervision ?  ?  ?  ?  ?  ?General transfer comment: supervision to rise from recliner and close supervision for increased safety to stand from rollator ?  ?  ?Balance Overall balance assessment: Needs assistance ?Sitting-balance support: Feet supported ?Sitting balance-Leahy Scale: Good ?  ?  ?Standing balance support: No upper extremity supported, During functional activity ?Standing balance-Leahy Scale: Fair ?  ?  ?  ?  ?  ?  ?  ?  ?  ?  ?  ?  ?   ? ?ADL either performed or assessed with clinical judgement  ? ?ADL Overall ADL's : Needs assistance/impaired ?  ?  ?  ?  ?  ?  ?  ?  ?  ?  ?  ?  ?Toilet Transfer: Supervision/safety;Ambulation;Rollator (4 wheels);Cueing for safety;Min guard ?Toilet Transfer Details (indicate cue type and reason): close supervision- min guard for simulated ambulatory transfer with rollator ?  ?  ?  ?  ?Functional mobility during ADLs: Supervision/safety;Min guard ?General ADL Comments: pt tolerated increased functional mobility distance to faciliate improved overall activity tolerance and endurance for higher level ADLS, no dizzziness reported ?  ? ?Extremity/Trunk Assessment Upper Extremity Assessment ?Upper Extremity Assessment: Overall WFL for tasks assessed ?  ?Lower Extremity Assessment ?Lower Extremity Assessment: Defer to PT evaluation ?  ?  ?  ? ?Vision Baseline Vision/History: 1  Wears glasses ?Patient Visual Report: No change from baseline ?Vision Assessment?: No apparent visual deficits ?  ?Perception Perception ?Perception: Within Functional Limits ?   ?Praxis Praxis ?Praxis: Intact ?  ? ?Cognition Arousal/Alertness: Awake/alert ?Behavior During Therapy: [facility] for tasks assessed/performed ?Overall Cognitive Status: History of cognitive impairments - at baseline ?  ?  ?  ?  ?  ?  ?  ?  ?  ?  ?  ?  ?  ?  ?  ?  ?General Comments: pt very motivated and hyperverbal but appropriate ?  ?  ?   ?Exercises   ? ?  ?Shoulder Instructions   ? ? ?  ?General Comments no reports of dizziness, talked in depth about AE for home such as compression sock donner, TTB, reacher and making sure to use rollator in his bed room as pt reports anytime he was fallen its been because he didn't take his rollator into the bed room  ? ? ?Pertinent Vitals/ Pain       Pain Assessment ?Pain Assessment: No/denies pain ? ?Home Living   ?  ?  ?  ?  ?  ?  ?  ?  ?  ?  ?  ?  ?  ?  ?  ?  ?  ?  ? ?  ?Prior Functioning/Environment    ?  ?  ?  ?   ? ?Frequency ? Min 2X/week  ? ? ? ? ?  ?Progress Toward Goals ? ?OT Goals(current goals can now be found in the care plan section) ? Progress towards OT goals: Progressing toward goals ? ?Acute Rehab OT Goals ?Patient Stated Goal: go home tomorrow ?OT Goal Formulation: With patient ?Time For Goal Achievement: 01/24/22 ?Potential to Achieve Goals: Good  ?Plan Discharge plan remains appropriate;Frequency remains appropriate   ? ?Co-evaluation ? ? ?   ?  ?  ?  ?  ? ?  ?AM-PAC OT "6 Clicks" Daily Activity     ?Outcome Measure ? ? Help from another person eating meals?: None ?Help from another person taking care of personal grooming?: A Little ?Help from another person toileting, which includes using toliet, bedpan, or urinal?: A Little ?Help from another person bathing (including washing, rinsing, drying)?: A Little ?Help from another person to put on and taking off regular upper body clothing?: None ?Help from another person to put on and taking off regular lower body clothing?: A Little ?6 Click Score: 20 ? ?  ?End of Session Equipment Utilized During Treatment:  Rollator (4 wheels) ? ?OT Visit Diagnosis: Unsteadiness on feet (R26.81);Repeated falls (R29.6);Muscle weakness (generalized) (M62.81) ?  ?Activity Tolerance Patient tolerated treatment well ?  ?Patient Left in bed;in chair;with call bell/phone within reach ?  ?Nurse Communication Mobility status;Other (comment) (gave pt coffee per RN approval) ?  ? ?   ? ?Time: 4166-0630 ?OT Time Calculation (min): 28 min ? ?Charges: OT General Charges ?$OT Visit: 1 Visit ?OT Treatments ?$Self Care/Home Management : 23-37 mins ? ?Corinne Ports K., COTA/L ?Acute Rehabilitation Services ?[phone] ? ? ?Precious Haws ?01/16/2022, 3:59 PM ?

## 2022-01-16 NOTE — Progress Notes (Signed)
Physical Therapy Treatment ?Patient Details ?Name: Jeffrey [name] ?MRN: [id number] ?DOB: 1966-01-24 ?Today's Date: 01/16/2022 ? ? ?History of Present Illness 56 y/o male presented to ED on 01/08/22 following unwitnessed seizure and found on floor. Found to be hypothermic. Recently admitted 3/16-3/27 for syncope and orthostatic hypotension. PMH: HTN, seizure disorder, DM2, hx of ETOH abuse, depression, hx of ABI ? ?  ?PT Comments  ? ? Continuing work on functional mobility and activity tolerance;  Started off session to find out if pt has any questions or specific skills he wants to work on, and Jeffrey [name] mentioned 2 things: donning compression stockings, and getting up off the floor; We took a little time to check out options that can help him with compression socks (and will of course defer to OT); As we walked, Pt described methods he has worked on before to do floor transfers including working in quadruped, weight shifts, pulling up on rollator; Added a floor transfer goal (can use fall risk mats, or pull out the visitor sofa to larger bed surface to work sit>quadruped>sit reps, etc); no presyncopal symptoms today   ?Recommendations for follow up therapy are one component of a multi-disciplinary discharge planning process, led by the attending physician.  Recommendations may be updated based on patient status, additional functional criteria and insurance authorization. ? ?Follow Up Recommendations ? Home health PT ?  ?  ?Assistance Recommended at Discharge Intermittent Supervision/Assistance  ?Patient can return home with the following A little help with walking and/or transfers;Assistance with cooking/housework;A little help with bathing/dressing/bathroom (assist for compression stockings) ?  ?Equipment Recommendations ? None recommended by PT  ?  ?Recommendations for Other Services   ? ? ?  ?Precautions / Restrictions Precautions ?Precautions: Fall ?Precaution Comments: monitor orthostatics; syncopal episodes --  improved with TED hose, and cues to self-monitor ?Restrictions ?Weight Bearing Restrictions: No  ?  ? ?Mobility ? Bed Mobility ?  ?  ?  ?  ?  ?  ?  ?  ?  ? ?Transfers ?Overall transfer level: Needs assistance ?Equipment used: Rollator (4 wheels) ?Transfers: Sit to/from Stand ?Sit to Stand: Supervision ?  ?  ?  ?  ?  ?General transfer comment: Stands from elevated bed; good hand placement and Rollator managemetn ?  ? ?Ambulation/Gait ?Ambulation/Gait assistance: Min guard ?Gait Distance (Feet): 200 Feet (x2; one seated rest break) ?Assistive device: Rollator (4 wheels) ?Gait Pattern/deviations: Step-through pattern ?Gait velocity: reduced ?  ?  ?General Gait Details: Slow steps, but overall improved from last admission; Cues to self-monitor for activity tolerance; Took one seated break, not because of feeling lightheaded, but mroe because he wanted to practice rollator management, which he did well -- even turning the rollator around before sitting, so that it would be facing teh way he wanted to be walking when he it was time to stand ? ? ?Stairs ?  ?  ?  ?  ?  ? ? ?Wheelchair Mobility ?  ? ?Modified Rankin (Stroke Patients Only) ?  ? ? ?  ?Balance   ?  ?Sitting balance-Jeffrey [name] Scale: Good ?  ?  ?  ?Standing balance-Jeffrey [name] Scale: Fair ?  ?  ?  ?  ?  ?  ?  ?  ?  ?  ?  ?  ?  ? ?  ?Cognition Arousal/Alertness: Awake/alert ?Behavior During Therapy: [facility] for tasks assessed/performed ?Overall Cognitive Status: History of cognitive impairments - at baseline ?  ?  ?  ?  ?  ?  ?  ?  ?  ?  ?  ?  ?  ?  ?  ?  ?  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  Feel this is probably baseline for since diagnosis. ?  ?  ? ?  ?Exercises   ? ?  ?General Comments General comments (skin integrity, edema, etc.): No lightheadedness during pregressive amb; able to state what to do if presyncopal symptoms arise; We discussed further actions to take to prevent falls, and pt promises to take his  Rollator with him wherever he goes ?  ?  ? ?Pertinent Vitals/Pain Pain Assessment ?Pain Assessment: No/denies pain  ? ? ?Home Living   ?  ?  ?  ?  ?  ?  ?  ?  ?  ?   ?  ?Prior Function    ?  ?  ?   ? ?PT Goals (current goals can now be found in the care plan section) Acute Rehab PT Goals ?Patient Stated Goal: home ?PT Goal Formulation: With patient ?Time For Goal Achievement: 01/24/22 ?Potential to Achieve Goals: Good ?Additional Goals ?Additional Goal #1: Pt will perform floor to standing transfer with light mod assist and furniture. ?Progress towards PT goals: Progressing toward goals (Will add floor transfer goal) ? ?  ?Frequency ? ? ? Min 3X/week ? ? ? ?  ?PT Plan Current plan remains appropriate  ? ? ?Co-evaluation   ?  ?  ?  ?  ? ?  ?AM-PAC PT "6 Clicks" Mobility   ?Outcome Measure ? Help needed turning from your back to your side while in a flat bed without using bedrails?: None ?Help needed moving from lying on your back to sitting on the side of a flat bed without using bedrails?: None ?Help needed moving to and from a bed to a chair (including a wheelchair)?: A Little ?Help needed standing up from a chair using your arms (e.g., wheelchair or bedside chair)?: A Little ?Help needed to walk in hospital room?: A Little ?Help needed climbing 3-5 steps with a railing? : A Lot ?6 Click Score: 19 ? ?  ?End of Session Equipment Utilized During Treatment: Gait belt ?Activity Tolerance: Patient tolerated treatment well ?Patient left: in chair;with call bell/phone within reach;with chair alarm set ?Nurse Communication: Mobility status ?PT Visit Diagnosis: Unsteadiness on feet (R26.81);Muscle weakness (generalized) (M62.81);History of falling (Z91.81);Other abnormalities of gait and mobility (R26.89) ?  ? ? ?Time: 2297-9892 ?PT Time Calculation (min) (ACUTE ONLY): 46 min ? ?Charges:  $Gait Training: 23-37 mins ?$Therapeutic Activity: 8-22 mins          ?          ? ?Roney Marion, PT  ?Acute Rehabilitation  Services ?Office [phone] ? ? ? ?Colletta [place] ?01/16/2022, 3:55 PM ? ?

## 2022-01-16 NOTE — Progress Notes (Signed)
FPTS Brief Note ?Reviewed patient's vitals, recent notes.  ?Vitals:  ? 01/16/22 1733 01/16/22 2016  ?BP: (!) 162/107 (!) 146/98  ?Pulse: 96 88  ?Resp: 16 18  ?Temp: 98.4 ?F (36.9 ?C) 97.7 ?F (36.5 ?C)  ?SpO2: 100% 99%  ? ?At this time, no change in plan from day progress note.  ?Kyndel Egger, DO ?Page [phone] with questions about this patient.  ?  ?

## 2022-01-16 NOTE — Progress Notes (Addendum)
Family Medicine Teaching Service ?Daily Progress Note ?Intern Pager: [phone] ? ?Patient name: Jeffrey [name] Medical record number: [id number] ?Date of birth: [birth year] Age: 56 y.o. Gender: male ? ?Primary Care Provider: [name], MD ?Consultants: None ?Code Status: Full Code ? ?Pt Overview and Major Events to Date:  ?4/4 - Pt admitted ? ?Assessment and Plan: ? ?Jeffrey [name] is a 56 y.o. male presenting after presumed unwitnessed seizure, found to have UTI and generalized weakness. PMH is significant for a seizure disorder, hx of cardiac arrest, HTN, DM2, hx of ETOH abuse, Depression, HLD, GERD ?  ?Hypotension ?Patient BP elevated to 140-150's/90-100's overnight. Pitting edema stable at level of knees with no fluid appreciated in hands. Patient ACTH stim test concerning for adrenal insufficiency yesterday. Will reach out to endocrine today. ?-Consult endocrine ?-TED Hose ?-PT/OT ? ?CKD IIIA ?Cr worse at 1.67 today, from 1.54 yesterday. AKI thought to be 2/2 to renal dysfunction from the suspected adrenal insufficiency. Will continue to monitor ?-AM BMP ? ?Hyperkalemia ?Patient continues to have bouts of hyperkalemia, requiring lokelma and insulin yesterday. Patient K rose overnight to 5.8 but came down to 5.1 after lokelma. Etiology of this hyperkalemia thought to be 2/2 renal insufficiency. Will reach out to endocrine today. Continue to monitor.  ?-Endocrine consult ?-3 PM BMP?? ? ?Loose Stools ?Patient reporting loose stools for last couple of days, as many as 8-20 in a day, described as loose with water, but still has some form. Patient reports not being able to get to bathroom in time, because of stool/urgency. Nursing staff reported only 1 stool yesterday, and 3 stools the previous day, but has not heard report of diarrhea.  Will consider treating for C.Diff infection, as patient had bout of C. Diff last hospitalization.  ?-Consider treating  ? ?Klebsiella UTI  Urinary hesitancy ?Patient dysuria  improving.  ?-Finasteride 5 mg ?-Duricef 500 mg BID 6/7 ? ?Type II DM ?CBG poorly controlled, patient sugars have ranged from 451-241 . Patient received 10 units of short acting insulin yesterday. Will continue to monitor. Patient had notably elevated sugars (451, 366) at 9 pm and 3 am, that were not corrected. ?-vsSSI ?-Continue to monitor ? 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 ?GERD ?-Protonix ? ?MDD ?-Cymbalta and mirtazapine ? ?Seizure disorder ?-Keppra ? ? ?FEN/GI: Regular diet ?PPx: Heparin ?Dispo:Home tomorrow.  ? ?Subjective:  ?Patient doing well, reports that dysuria is improving ? ?Objective: ?Temp:  [97.9 ?F (36.6 ?C)-98.8 ?F (37.1 ?C)] 98.5 ?F (36.9 ?C) (04/11 1956) ?Pulse Rate:  [99-105] 100 (04/11 1956) ?Resp:  [16-18] 18 (04/11 1956) ?BP: (142-150)/(86-105) 149/94 (04/11 1956) ?SpO2:  [99 %-100 %] 100 % (04/11 1956) ?Physical Exam: ?General: Well appearing, obese, polite, NAD, chatty, African American male ?Cardiovascular: RRR, NRMG ?Respiratory: CTABL ?Abdomen: Soft, non-distended, NTTP ?Extremities: 1+ pitting edema in BLE to level of knee, compression leggings on, BUE w/o edema  ? ?Laboratory: ?Recent Labs  ?Lab 01/10/22 ?1478  ?WBC 6.8  ?HGB 11.3*  ?HCT 35.0*  ?PLT 532*  ? ?Recent Labs  ?Lab 01/12/22 ?1342 01/14/22 ?2956 01/14/22 ?1247 01/15/22 ?2130 01/15/22 ?1452 01/15/22 ?2121 01/16/22 ?0320  ?NA 138 135   < > 138 134* 132* 134*  ?K 4.9 5.3*   < > 4.7 5.4* 5.8* 5.1  ?CL 108 106   < > 109 105 106 108  ?CO2 24 25   < > 27 24 18* 22  ?BUN 21* 22*   < > 22* 23* 24* 23*  ?CREATININE 1.80* 1.73*   < >  1.54* 1.67* 1.71* 1.63*  ?CALCIUM 7.6* 7.7*   < > 7.6* 7.4* 7.4* 7.6*  ?PROT 5.6* 5.7*  --  4.8*  --   --   --   ?BILITOT 0.6 0.5  --  0.4  --   --   --   ?ALKPHOS 276* 268*  --  235*  --   --   --   ?ALT 48* 45*  --  37  --   --   --   ?AST 46* 42*  --  36  --   --   --   ?GLUCOSE 167* 213*   < > 140* 380* 451* 366*  ? < > = values in this interval not displayed.  ? ? ? ? ?Imaging/Diagnostic  Tests: ? ? ?Holley Bouche, MD ?01/16/2022, 5:41 AM ?PGY-1, [facility] ?[place] Intern pager: [phone], text pages welcome ? ?

## 2022-01-16 NOTE — Progress Notes (Signed)
Inpatient Diabetes Program Recommendations ? ?AACE/ADA: New Consensus Statement on Inpatient Glycemic Control (2015) ? ?Target Ranges:  Prepandial:   less than 140 mg/dL ?     Peak postprandial:   less than 180 mg/dL (1-2 hours) ?     Critically ill patients:  140 - 180 mg/dL  ? ?Lab Results  ?Component Value Date  ? GLUCAP 322 (H) 01/16/2022  ? HGBA1C 7.8 (A) 11/19/2021  ? ? ?Review of Glycemic Control ? Latest Reference Range & Units 01/14/22 21:37 01/15/22 07:22 01/15/22 11:43 01/15/22 16:31 01/16/22 08:12  ?Glucose-Capillary 70 - 99 mg/dL 220 (H) 140 (H) 241 (H) 377 (H) 322 (H)  ?Diabetes history: DM 2 ?Outpatient Diabetes medications:  ?Trulicity 1.03 mg weekly, Metformin-XR 500 mg bid ?Current orders for Inpatient glycemic control:  ?Novolog 0-6 units tid with meals ? ?Inpatient Diabetes Program Recommendations:   ? ?Consider adding Novolog meal coverage 2 units tid with meals (hold if patient eats less than 50% or NPO).  Also consider adding bedtime coverage starting at 201 mg/dL.  ? ?Thanks  ?Adah Perl, RN, BC-ADM ?Inpatient Diabetes Coordinator ?Pager [phone]  (8a-5p) ? ? ?

## 2022-01-17 ENCOUNTER — Other Ambulatory Visit: Payer: Self-pay | Admitting: Family Medicine

## 2022-01-17 DIAGNOSIS — E875 Hyperkalemia: Secondary | ICD-10-CM

## 2022-01-17 LAB — CBC
HCT: 28.1 % — ABNORMAL LOW (ref 39.0–52.0)
Hemoglobin: 9.2 g/dL — ABNORMAL LOW (ref 13.0–17.0)
MCH: 26.6 pg (ref 26.0–34.0)
MCHC: 32.7 g/dL (ref 30.0–36.0)
MCV: 81.2 fL (ref 80.0–100.0)
Platelets: 270 10*3/uL (ref 150–400)
RBC: 3.46 MIL/uL — ABNORMAL LOW (ref 4.22–5.81)
RDW: 26.3 % — ABNORMAL HIGH (ref 11.5–15.5)
WBC: 7 10*3/uL (ref 4.0–10.5)
nRBC: 0 % (ref 0.0–0.2)

## 2022-01-17 LAB — BASIC METABOLIC PANEL
Anion gap: 3 — ABNORMAL LOW (ref 5–15)
BUN: 25 mg/dL — ABNORMAL HIGH (ref 6–20)
CO2: 27 mmol/L (ref 22–32)
Calcium: 7.8 mg/dL — ABNORMAL LOW (ref 8.9–10.3)
Chloride: 109 mmol/L (ref 98–111)
Creatinine, Ser: 1.56 mg/dL — ABNORMAL HIGH (ref 0.61–1.24)
GFR, Estimated: 51 mL/min — ABNORMAL LOW (ref >60)
Glucose, Bld: 61 mg/dL — ABNORMAL LOW (ref 70–99)
Potassium: 4.8 mmol/L (ref 3.5–5.1)
Sodium: 139 mmol/L (ref 135–145)

## 2022-01-17 LAB — GLUCOSE, CAPILLARY
Glucose-Capillary: 45 mg/dL — ABNORMAL LOW (ref 70–99)
Glucose-Capillary: 130 mg/dL — ABNORMAL HIGH (ref 70–99)
Glucose-Capillary: 113 mg/dL — ABNORMAL HIGH (ref 70–99)
Glucose-Capillary: 221 mg/dL — ABNORMAL HIGH (ref 70–99)

## 2022-01-17 MED ORDER — ASCORBIC ACID 500 MG PO TABS: 500.0000 mg | ORAL_TABLET | Freq: Two times a day (BID) | ORAL | Status: DC

## 2022-01-17 MED ORDER — FINASTERIDE 5 MG PO TABS: 5.0000 mg | ORAL_TABLET | Freq: Every day | ORAL | 0 refills | Status: DC

## 2022-01-17 MED ORDER — ZINC SULFATE 220 (50 ZN) MG PO CAPS: 220.0000 mg | ORAL_CAPSULE | Freq: Every day | ORAL | Status: DC

## 2022-01-17 NOTE — Progress Notes (Addendum)
Nutrition Follow-up ? ?DOCUMENTATION CODES:  ?Obesity unspecified ? ?INTERVENTION:  ?-continue Glucerna Shake po BID, each supplement provides 220 kcal and 10 grams of protein ?-continue Ensure Max po daily, each supplement provides 150 kcal and 30 grams of protein ?-continue MVI with minerals daily ? ?NUTRITION DIAGNOSIS:  ?Increased nutrient needs related to wound healing as evidenced by estimated needs. ?ongoing ? ?GOAL:  ?Patient will meet greater than or equal to 90% of their needs ?progressing ? ?MONITOR:  ?PO intake, Supplement acceptance, Diet advancement, Labs, Weight trends, Skin, I & O's ? ?REASON FOR ASSESSMENT:  ?Consult ?Assessment of nutrition requirement/status ? ?ASSESSMENT:  ?Jeffrey [name] is a 56 y.o. male presenting with fall, found to be hypothermic on admission after an unwitnessed seizure. PMH is significant for a seizure disorder, HTN, DM2, hx of ETOH abuse, Depression, HLD, GERD ? ?MD concerned that pt may have adrenal insufficiency given pt has had recurrent hyperkalemic episodes, intermittent hypoglycemia, and orthostasis -- planning to check ACTH. ? ?Pt has had excellent appetite/PO Intake since last RD assessment -- 75-100% x last 8 meal completions. Per RN, pt also doing well with ONS. Recommend continue current nutrition plan of care.  ? ?Suspect pt's current listed weight was not measured -- weights since admission (aside from current weight of 78.9 kg) has ranged from 118.8-123.8 kg. Note pt does have ongoing moderate pitting edema to BLE per RN assessment.  ? ?Medications: ? vitamin C  500 mg Oral BID  ? feeding supplement (GLUCERNA SHAKE)  237 mL Oral BID BM  ? folic acid  1 mg Oral QHS  ? heparin  5,000 Units Subcutaneous Q8H  ? insulin aspart  0-5 Units Subcutaneous QHS  ? insulin aspart  0-9 Units Subcutaneous TID WC  ? multivitamin with minerals  1 tablet Oral Daily  ? pantoprazole  40 mg Oral QHS  ? Ensure Max Protein  11 oz Oral QHS  ? thiamine  100 mg Oral Daily  ? zinc  sulfate  220 mg Oral Daily  ? ?Labs: ?Recent Labs  ?Lab 01/11/22 ?0253 01/12/22 ?1342 01/14/22 ?3086 01/14/22 ?1247 01/16/22 ?0320 01/16/22 ?1521 01/17/22 ?0707  ?NA 138 138 135   < > 134* 135 139  ?K 4.6 4.9 5.3*   < > 5.1 5.0 4.8  ?CL 109 108 106   < > 108 107 109  ?CO2 '25 24 25   '$ < > '22 24 27  '$ ?BUN 20 21* 22*   < > 23* 24* 25*  ?CREATININE 1.74* 1.80* 1.73*   < > 1.63* 1.62* 1.56*  ?CALCIUM 7.2* 7.6* 7.7*   < > 7.6* 7.8* 7.8*  ?MG 1.7 1.8 1.7  --   --   --   --   ?GLUCOSE 278* 167* 213*   < > 366* 281* 61*  ? < > = values in this interval not displayed.  ?CBGs: 45-322 x24 hours (Diabetes Coordinator following) ? ?NUTRITION - FOCUSED PHYSICAL EXAM: ?Flowsheet Row Most Recent Value  ?Orbital Region No depletion  ?Upper Arm Region Mild depletion  ?Thoracic and Lumbar Region No depletion  ?Buccal Region No depletion  ?Temple Region Mild depletion  ?Clavicle Bone Region Mild depletion  ?Clavicle and Acromion Bone Region No depletion  ?Scapular Bone Region No depletion  ?Dorsal Hand No depletion  ?Patellar Region No depletion  ?Anterior Thigh Region No depletion  ?Posterior Calf Region No depletion  ?Edema (RD Assessment) Moderate  ?Hair Reviewed  ?Eyes Reviewed  ?Mouth Reviewed  ?Skin Reviewed  ?Nails  Reviewed  ? ?Diet Order:   ?Diet Order   ? ?       ?  Diet Carb Modified Fluid consistency: Thin; Room service appropriate? Yes  Diet effective now       ?  ? ?  ?  ? ?  ? ?EDUCATION NEEDS:  ?No education needs have been identified at this time ? ?Skin:  Skin Assessment: Skin Integrity Issues: ?Skin Integrity Issues:: Diabetic Ulcer ?Diabetic Ulcer: rt toes, lt great and 2nd toes ? ?Last BM:  4/11 ? ?Height:  ?Ht Readings from Last 1 Encounters:  ?01/12/22 '5\' 10"'$  (1.778 m)  ? ?Weight:  ?Wt Readings from Last 1 Encounters:  ?01/14/22 78.9 kg  ? ?Ideal Body Weight:  75.5 kg ? ?BMI:  Body mass index is 24.97 kg/m?. ? ?Estimated Nutritional Needs:  ?Kcal:  2250-2450 ?Protein:  115-130 grams ?Fluid:  > 2 L ? ? ?Theone Stanley., MS,  RD, LDN (she/her/hers) ?RD pager number and weekend/on-call pager number located in [place] ?

## 2022-01-17 NOTE — Discharge Summary (Signed)
Family Medicine Teaching Service ?Hospital Discharge Summary ? ?Patient name: Jeffrey [name] Medical record number: [id number] ?Date of birth: March 31, 1966 Age: 56 y.o. Gender: male ?Date of Admission: 01/08/2022  Date of Discharge: 01/17/22 ?Admitting Physician: Jeffrey Chime, MD ? ?Primary Care Provider: [name], MD ?Consultants: None, Curbside Endocrinology  ? ?Indication for Hospitalization: Seizure like activity, Orthostatic hypotension ? ?Discharge Diagnoses/Problem List:  ?Falls  Generalized weakness  Seizure disorder ?Hypothermia ?Elevated CK ?Hypertension  Orthostatic hypotension ?Concern for Adrenal Insufficiency ?Hypoglycemia ?Hyperkalemia ?Elevated CK ?Type 2 DM ?Klebsiella UTI ?Prolonged QTC ?GERD ?MDD ? ? ?Disposition: Home ? ?Discharge Condition: Improved ? ?Discharge Exam:  ?Temp:  [97.7 ?F (36.5 ?C)-98.6 ?F (37 ?C)] 98.2 ?F (36.8 ?C) (04/13 0500) ?Pulse Rate:  [82-101] 82 (04/13 0500) ?Resp:  [16-18] 18 (04/13 0500) ?BP: (140-162)/(88-107) 145/90 (04/13 0500) ?SpO2:  [98 %-100 %] 99 % (04/13 0500) ?Physical Exam: ?General: Well appearing, obese, polite, NAD, chatty, African American male ?Cardiovascular: RRR, NRMG ?Respiratory: CTABL ?Abdomen: Soft, non-distended, NTTP ?Extremities: 1+ pitting edema in BLE to level of knee, Ted Hose on BLE ? ?Brief Hospital Course:  ?Jeffrey [name] is a 56 y.o. male who was admitted to the [facility] Medicine Teaching Service at [facility] for fall, seizure, hypothermia. Hospital course is outlined below by system.  ? ?Falls  Generalized Weakness  Seizure Disorder ?Patient presented to [facility] ED on 4/4 after an unwitnessed fall secondary to a self-reported seizure at home after drinking 12 pack of beer.  He reports that he drank a 12 pack of beer prior to the onset of seizure activity.  Patient was hypothermic on arrival, which was quickly resolved with [facility].  Patient was closely monitored and had no recurrence of seizure activity, was continued on home  seizure medication.  Likely precipitated secondary to decrease seizure threshold with alcohol use. Patient was evaluated by physical therapy with recommendations for outpatient follow-up. Patient will need to abstain from driving until seizure free for at least 6 months.  ? ?Orthostatic Hypotension  Hypertension ?Patient has history of orthostatic hypotension, that was present on admission. Patient BP wavered from hypotensive to hypertensive during admission. Due to risk of orthostatic hypotension, patient BP was allowed to be severely hypertensive, without symptoms. By discharge, patient's BP was appropriate.   ? ?Hyperkalemia ?Patient potassium was elevated beyond normal ranges, requiring treatment with lokelma, multiple times. Potassium was thought to be elevated 2/2 poorly managed blood sugars and possibly adrenal insufficiency (see below). Patient potassium was stable and 4.6 on day of discharge. ? ?Hypoglycemia ?Patient blood sugars poorly controlled during hospitalization. Patient had multiple episodes of hyperglycemia, attempted to correct with long acting insuline leading to episodes of hypoglycemia, and insulin discontinued.  ?Patient sugars were monitored and appropriate on day of discharge. Discharged on home regimen of trulicty and metformin.   ? ?Klebsiella Urinary tract infection ?Patient initially not reporting symptoms on admission, but later reported symptoms have been present for weeks.  Patient treated with Duricef, for 7 days. Patient was asymptomatic by day of discharge. ? ?Lower extremity and BUE Edema  ?RUE doppler negative for DVT. Most likely due to cirrhosis and poor nutritional status. Gently diuresed and BUE edema improved. TED hoses were applied while inpatient to BLE. ? ?Orthostatic hypotension   ?Noted orthostatic vitals but will plan to treat patient's hypertension based on standing blood pressures as patient becomes significantly symptomatic with over treatment of blood  pressure. ? ?Abnormal ACTH  Concern for Adrenal Insufficiency  ?Patient with abnormal  ACTH stimulation test during last hospitalization, and was found to have abnormal ACTH stimulation test during this hospitalization. Result was concerning for adrenal insufficiency. Spoke with endocrinologist who recommended morning ACTH level & morning Renin/Aldosterone level, as well as glutamic acid decarboxylase (GAD65), to further determine adrenal insufficiency. Labs were collected during admission but had not resulted by discharge. Please follow up results.  ? ? ?All other chronic conditions were stable during hospitalization. ? ?Issues for follow-up ?Patient to follow up with Endocrine, Dr. Buddy [name] ?Follow up Adrenal Insufficiency labs ?Repeat BMP to monitor creatinine & K level ?Check CBG - ensure patient has supplies for CGM ?Consider repeat EKG to monitor Qtc ?Ensure patient is not driving to appointments, if driving with children in the car, may need to consider CPS report ?Finasteride started inpatient ? ? ? ?Significant Procedures: None ? ?Significant Labs and Imaging:  ?Recent Labs  ?Lab 01/16/22 ?1521 01/17/22 ?0707  ?WBC 5.8 7.0  ?HGB 9.2* 9.2*  ?HCT 29.5* 28.1*  ?PLT 290 270  ? ?Recent Labs  ?Lab 01/11/22 ?0253 01/12/22 ?1342 01/14/22 ?8657 01/14/22 ?1247 01/15/22 ?8469 01/15/22 ?1452 01/15/22 ?2121 01/16/22 ?0320 01/16/22 ?1521 01/17/22 ?0707  ?NA 138 138 135   < > 138 134* 132* 134* 135 139  ?K 4.6 4.9 5.3*   < > 4.7 5.4* 5.8* 5.1 5.0 4.8  ?CL 109 108 106   < > 109 105 106 108 107 109  ?CO2 $Rem'25 24 25   'BIqn$ < > 27 24 18* $Remo'22 24 27  'nLwaS$ ?GLUCOSE 278* 167* 213*   < > 140* 380* 451* 366* 281* 61*  ?BUN 20 21* 22*   < > 22* 23* 24* 23* 24* 25*  ?CREATININE 1.74* 1.80* 1.73*   < > 1.54* 1.67* 1.71* 1.63* 1.62* 1.56*  ?CALCIUM 7.2* 7.6* 7.7*   < > 7.6* 7.4* 7.4* 7.6* 7.8* 7.8*  ?MG 1.7 1.8 1.7  --   --   --   --   --   --   --   ?ALKPHOS 215* 276* 268*  --  235*  --   --   --   --   --   ?AST 37 46* 42*  --  36  --   --   --   --    --   ?ALT 44 48* 45*  --  37  --   --   --   --   --   ?ALBUMIN <1.5* <1.5* <1.5*  --  <1.5*  --   --   --   --   --   ? < > = values in this interval not displayed.  ? ? ? ? ?Results/Tests Pending at Time of Discharge:  ? ?Adrenal Insufficiency labs: ?ACTH ?Renin/Aldosterone ?GAD 65 ? ?Discharge Medications:  ?Allergies as of 01/17/2022   ? ?   Reactions  ? Atorvastatin Other (See Comments)  ? Cause Muscle Weakness  ? Drug Ingredient [spironolactone] Other (See Comments)  ? Hyperkalemia  ? Lisinopril Other (See Comments)  ? Pt reports nose bleeds  ? Losartan Other (See Comments)  ? Patient stopped taking this because he felt like passing out after standing up  ? ?  ? ?  ?Medication List  ?  ? ?TAKE these medications   ? ?ascorbic acid 500 MG tablet ?Commonly known as: VITAMIN C ?Take 1 tablet (500 mg total) by mouth 2 (two) times daily. ?Notes to patient: 01/17/2022 ?  ?blood glucose meter kit and supplies ?Dispense based on patient  and insurance preference. Use up to four times daily as directed. (FOR ICD-10 E10.9, E11.9). dispense 90 test strips and 90 lancets. ?Notes to patient: Continue home schedule  ?  ?calcium carbonate 1250 (500 Ca) MG tablet ?Commonly known as: OS-CAL - dosed in mg of elemental calcium ?Take 1 tablet (500 mg of elemental calcium total) by mouth daily with breakfast. ?Notes to patient: 01/18/2022 ?  ?DULoxetine 30 MG capsule ?Commonly known as: Cymbalta ?Take 1 capsule (30 mg total) by mouth daily. ?Notes to patient: 01/18/2022 ?  ?Ensure Max Protein Liqd ?Take 330 mLs (11 oz total) by mouth 3 (three) times daily between meals. ?What changed: when to take this ?Notes to patient: 01/18/2022 ?  ?FeroSul 325 (65 FE) MG tablet ?Generic drug: ferrous sulfate ?Take 1 tablet (325 mg total) by mouth every other day. ?What changed: when to take this ?Notes to patient: 01/18/2022 ?  ?finasteride 5 MG tablet ?Commonly known as: PROSCAR ?Take 1 tablet (5 mg total) by mouth daily. ?  ?folic acid 1 MG  tablet ?Commonly known as: FOLVITE ?Take 1 tablet by mouth once daily ?What changed: when to take this ?  ?FreeStyle Libre 2 Sensor Misc ?Use as directed ?  ?gabapentin 300 MG capsule ?Commonly known as: NEURONTIN ?Take 2 capsule

## 2022-01-17 NOTE — TOC Transition Note (Signed)
Transition of Care (TOC) - CM/SW Discharge Note ? ? ?Patient Details  ?Name: Jeffrey [name] ?MRN: [id number] ?Date of Birth: 28-Dec-1965 ? ?Transition of Care (TOC) CM/SW Contact:  ?Tom-Johnson, Renea Ee, RN ?Phone Number: ?01/17/2022, 5:16 PM ? ? ?Clinical Narrative:    ? ?Patient is scheduled for discharge today. Home health referral with [facility] and information on [facility]. Cab vouchers given to patient for transportation home at discharge and also for transportation to and from his Lab appointment tomorrow 01/18/22. No further TOC need noted.  ? ?Final next level of care: [place] ?Barriers to Discharge: Barriers Resolved ? ? ?Patient Goals and CMS Choice ?Patient states their goals for this hospitalization and ongoing recovery are:: To return home ?CMS Medicare.gov Compare Post Acute Care list provided to:: Patient ?Choice offered to / list presented to : Patient ? ?Discharge Placement ?  ?           ?  ?Patient to be transferred to facility by: [facility] ?  ?  ? ?Discharge Plan and Services ?  ?Discharge Planning Services: CM Consult ?Post Acute Care Choice: Home Health          ?  ?  ?  ?  ?  ?  ?[place] Agency: [place] ?Date HH Agency Contacted: 01/15/22 ?Time [place]: 1000 ?Representative spoke with at [place]: Tommi Rumps ? ?Social Determinants of Health (SDOH) Interventions ?  ? ? ?Readmission Risk Interventions ? ?  12/21/2021  ?  3:10 PM 09/17/2021  ? 12:59 PM 08/01/2021  ?  1:24 PM  ?Readmission Risk Prevention Plan  ?Transportation Screening Complete Complete Complete  ?PCP or Specialist Appt within 5-7 Days   Complete  ?PCP or Specialist Appt within 3-5 Days Complete Complete   ?Home Care Screening   Patient refused  ?Medication Review (RN CM)   Complete  ?[place] or Home Care Consult Complete Complete   ?Social Work Consult for [place] Planning/Counseling Complete Complete   ?Palliative Care Screening Not Applicable Not Applicable   ?Medication Review Designer, fashion/clothing) Complete Complete   ? ? ? ? ? ?

## 2022-01-17 NOTE — Progress Notes (Addendum)
Family Medicine Teaching Service ?Daily Progress Note ?Intern Pager: [phone] ? ?Patient name: Jeffrey [name] Medical record number: [id number] ?Date of birth: 09-30-1966 Age: 56 y.o. Gender: male ? ?Primary Care Provider: [name], MD ?Consultants: None ?Code Status: Full Code ? ?Pt Overview and Major Events to Date:  ?4/4 - Pt admitted ? ?Assessment and Plan: ? ?Jeffrey [name] is a 56 y.o. male presenting after presumed unwitnessed seizure, found to have UTI and generalized weakness. PMH is significant for a seizure disorder, hx of cardiac arrest, HTN, DM2, hx of ETOH abuse, Depression, HLD, GERD ?  ?Hypotension ?Patient BP 140-160/80-100's, patient with no complaints of hypotension/dizziness. Patient is stable for discharge. Spoke with endocrine about patient ACTH stim test and he recommended further testing, see below. If labs continue to be suggestive of adrenal insufficiency, will consider treating with hydrocortisone. ACTH will help determine primary vs secondary/tertiary, GAD 65 will help determine Type 1 (pos) vs Type 2 DM (neg). ?AM ACTH: ?AM Renin / Aldosterone: ?Glutamic acid decarboxylase auto antibodies ?-TED Hose ?-PT/OT ?-F/u AM adrenal insufficiency labs ? ? ?CKD IIIA ?Cr improved today at 1.56 (1.62). Will continue to monitor ?-AM BMP ? ?Hyperkalemia ?K 4.8 today, improved from yesterday afternoon at 5.0. Improvement likely 2/2 better control of CBG with long acting insulin. Will continue to monitor.  ?-AM BMP ? ?Type II DM ?CBG range 322-281, patient received 17 units short acting, 3 units long acting. Patient had hypoglycemia to 45 this am, and is likely very sensitive to long acting insulin. Will adjust insulin regimen/discontinue long acting insulin. Will encourage home regimen/adjust for at d/c.  ?-sSSI, w/ night coverage ?-Stop long acting insulin ? ?Klebsiella UTI  Urinary hesitancy ?Patient dysuria resolved. Still continues to complain of urinary hesitancy. ?Duricef 500 mg BID  7/7 ?-Finasteride 5 mg daily ? ?Prolonged QTC - Chronic, stable ?GERD -Protonix ?MDD -Cymbalta and mirtazapine ?Seizure disorder -Keppra ? ?FEN/GI: Regular diet ?PPx: Lovenox ?Dispo:Home today.   ? ?Subjective:  ?Patient doing well today, no complaints, notes dysuria is improved.  ? ?Objective: ?Temp:  [97.7 ?F (36.5 ?C)-98.6 ?F (37 ?C)] 98.2 ?F (36.8 ?C) (04/13 0500) ?Pulse Rate:  [82-101] 82 (04/13 0500) ?Resp:  [16-18] 18 (04/13 0500) ?BP: (140-162)/(88-107) 145/90 (04/13 0500) ?SpO2:  [98 %-100 %] 99 % (04/13 0500) ?Physical Exam: ?General: Well appearing, obese, polite, NAD, chatty, African American male ?Cardiovascular: RRR, NRMG ?Respiratory: CTABL ?Abdomen: Soft, non-distended, NTTP ?Extremities: 1+ pitting edema in BLE to level of knee, Ted Hose one ? ?Laboratory: ?Recent Labs  ?Lab 01/16/22 ?1521  ?WBC 5.8  ?HGB 9.2*  ?HCT 29.5*  ?PLT 290  ? ?Recent Labs  ?Lab 01/12/22 ?1342 01/14/22 ?9485 01/14/22 ?1247 01/15/22 ?4627 01/15/22 ?1452 01/15/22 ?2121 01/16/22 ?0320 01/16/22 ?1521  ?NA 138 135   < > 138   < > 132* 134* 135  ?K 4.9 5.3*   < > 4.7   < > 5.8* 5.1 5.0  ?CL 108 106   < > 109   < > 106 108 107  ?CO2 24 25   < > 27   < > 18* 22 24  ?BUN 21* 22*   < > 22*   < > 24* 23* 24*  ?CREATININE 1.80* 1.73*   < > 1.54*   < > 1.71* 1.63* 1.62*  ?CALCIUM 7.6* 7.7*   < > 7.6*   < > 7.4* 7.6* 7.8*  ?PROT 5.6* 5.7*  --  4.8*  --   --   --   --   ?  BILITOT 0.6 0.5  --  0.4  --   --   --   --   ?ALKPHOS 276* 268*  --  235*  --   --   --   --   ?ALT 48* 45*  --  37  --   --   --   --   ?AST 46* 42*  --  36  --   --   --   --   ?GLUCOSE 167* 213*   < > 140*   < > 451* 366* 281*  ? < > = values in this interval not displayed.  ? ? ? ? ?Imaging/Diagnostic Tests: ? ? ?Holley Bouche, MD ?01/17/2022, 5:59 AM ?PGY-1, [place] ?[place] Intern pager: [phone], text pages welcome ? ?

## 2022-01-17 NOTE — Progress Notes (Signed)
Inpatient Diabetes Program Recommendations ? ?AACE/ADA: New Consensus Statement on Inpatient Glycemic Control (2015) ? ?Target Ranges:  Prepandial:   less than 140 mg/dL ?     Peak postprandial:   less than 180 mg/dL (1-2 hours) ?     Critically ill patients:  140 - 180 mg/dL  ? ?Lab Results  ?Component Value Date  ? GLUCAP 113 (H) 01/17/2022  ? HGBA1C 7.8 (A) 11/19/2021  ? ? ?Review of Glycemic Control ? Latest Reference Range & Units 01/16/22 11:56 01/16/22 20:19 01/17/22 07:49 01/17/22 09:13 01/17/22 12:02  ?Glucose-Capillary 70 - 99 mg/dL 296 (H) 299 (H) 45 (L) 130 (H) 113 (H)  ?Diabetes history: DM 2 ?Outpatient Diabetes medications:  ?Trulicity 1.61 weekly, Metformin XR 500 mg bid ?Current orders for Inpatient glycemic control:  ?Novolog sensitive tid with meals and HS ? ?Inpatient Diabetes Program Recommendations:   ? ?Note low blood sugars and patients extreme sensitivity to insulins.   ?Patient does have "freestyle" Elenor Legato that he uses at home.  Encouraged him to monitor glucose levels closely and reviewed treatment of hypoglycemia with patient. Patient able to teach back treatment of hypoglycemia.  Also encouraged patient to call MD if blood sugars consistently >200 mg/dL.   ?Will follow.  ? ?Thanks,  ?Adah Perl, RN, BC-ADM ?Inpatient Diabetes Coordinator ?Pager [phone]    (8a-5p) ? ? ?

## 2022-01-17 NOTE — Progress Notes (Incomplete)
Hypoglycemic Event ? ?CBG: 45  ? ?Treatment: 8 oz juice/soda, pt is eating breakfast ? ?Symptoms: Pt reports he feels a little light headed ? ?Follow-up CBG: Time:*** CBG Result:*** ? ?Possible Reasons for Event: Medication regimen:   ? ?Comments/MD notified:Resident at bedside and is aware; long acting insulin has been d/c ? ? ? ?Jeffrey [name] ? ? ?

## 2022-01-18 ENCOUNTER — Other Ambulatory Visit (HOSPITAL_COMMUNITY)
Admission: RE | Admit: 2022-01-18 | Discharge: 2022-01-18 | Disposition: A | Discharge status: Home / Self Care | Payer: Managed Care, Other (non HMO) | Attending: Family Medicine | Admitting: Family Medicine

## 2022-01-18 ENCOUNTER — Other Ambulatory Visit: Payer: Managed Care, Other (non HMO)

## 2022-01-18 ENCOUNTER — Other Ambulatory Visit: Payer: Self-pay | Admitting: Family Medicine

## 2022-01-18 DIAGNOSIS — R55 Syncope and collapse: Secondary | ICD-10-CM

## 2022-01-18 DIAGNOSIS — E875 Hyperkalemia: Secondary | ICD-10-CM

## 2022-01-18 LAB — COMPREHENSIVE METABOLIC PANEL
ALT: 35 U/L (ref 0–44)
AST: 40 U/L (ref 15–41)
Albumin: <1.5 g/dL — ABNORMAL LOW (ref 3.5–5.0)
Alkaline Phosphatase: 244 U/L — ABNORMAL HIGH (ref 38–126)
Anion gap: 5 (ref 5–15)
BUN: 28 mg/dL — ABNORMAL HIGH (ref 6–20)
CO2: 23 mmol/L (ref 22–32)
Calcium: 7.7 mg/dL — ABNORMAL LOW (ref 8.9–10.3)
Chloride: 109 mmol/L (ref 98–111)
Creatinine, Ser: 1.67 mg/dL — ABNORMAL HIGH (ref 0.61–1.24)
GFR, Estimated: 47 mL/min — ABNORMAL LOW (ref >60)
Glucose, Bld: 208 mg/dL — ABNORMAL HIGH (ref 70–99)
Potassium: 5.8 mmol/L — ABNORMAL HIGH (ref 3.5–5.1)
Sodium: 137 mmol/L (ref 135–145)
Total Bilirubin: 0.5 mg/dL (ref 0.3–1.2)
Total Protein: 5.4 g/dL — ABNORMAL LOW (ref 6.5–8.1)

## 2022-01-18 LAB — ACTH: C206 ACTH: 16.5 pg/mL (ref 7.2–63.3)

## 2022-01-18 NOTE — Progress Notes (Signed)
Unable to obtain BMP. Patient was sent to [facility] hospital outpatient lab. Jaymes Graff Sohail Capraro ? ?

## 2022-01-19 ENCOUNTER — Telehealth: Payer: Self-pay | Admitting: Family Medicine

## 2022-01-19 LAB — GLUTAMIC ACID DECARBOXYLASE AUTO ABS: Glutamic Acid Decarb Ab: <5 U/mL (ref 0.0–5.0)

## 2022-01-19 NOTE — Telephone Encounter (Signed)
Attempted to call patient this afternoon to reach him regarding lab results as patient has not presented to the ER to get any labs completed. Called both personal phone numbers listed in the chart without any answer. Prior messages left on voicemail by Dr. Thompson Grayer. ? ? ?Jeffrey Minshall, DO  ?

## 2022-01-19 NOTE — Telephone Encounter (Signed)
Called and left VM, discussed patient needs to come to ED to getK rechecked and possibly admitted if remains high. Discussed risk of life threatening arrythmia if not followed closely. ? ?Will try again later. ? ?Yehuda Savannah MD ?

## 2022-01-19 NOTE — Telephone Encounter (Signed)
-----   Message from Martyn Malay, MD sent at 01/19/2022  8:43 AM EDT ----- ?Hi All--I re-ordered these yesterday while precepting--he went to the hospital lab to have drawn. Happy to help in anyway ? ?Thanks ?CB  ?

## 2022-01-21 ENCOUNTER — Other Ambulatory Visit: Payer: Self-pay

## 2022-01-21 ENCOUNTER — Other Ambulatory Visit: Payer: Managed Care, Other (non HMO)

## 2022-01-21 DIAGNOSIS — F331 Major depressive disorder, recurrent, moderate: Secondary | ICD-10-CM

## 2022-01-21 MED ORDER — DULOXETINE HCL 30 MG PO CPEP: 30.0000 mg | ORAL_CAPSULE | Freq: Every day | ORAL | 3 refills | Status: DC

## 2022-01-21 MED ORDER — MIRTAZAPINE 30 MG PO TABS
30.0000 mg | ORAL_TABLET | Freq: Every day | ORAL | 3 refills | Status: DC
Start: 2022-01-21 — End: 2022-02-25

## 2022-01-21 NOTE — Telephone Encounter (Signed)
Insurance requires a 90 day supply. Jeffrey [name], CMA ? ?

## 2022-01-22 LAB — ALDOSTERONE + RENIN ACTIVITY W/ RATIO
ALDO / PRA Ratio: 2.8 (ref 0.0–30.0)
Aldosterone: 1.5 ng/dL (ref 0.0–30.0)
PRA LC/MS/MS: 0.544 ng/mL/hr (ref 0.167–5.380)

## 2022-01-23 ENCOUNTER — Emergency Department (HOSPITAL_COMMUNITY)
Admission: EM | Admit: 2022-01-23 | Discharge: 2022-01-24 | Disposition: A | Discharge status: Home / Self Care | Payer: Commercial Managed Care - HMO | Attending: Emergency Medicine | Admitting: Emergency Medicine

## 2022-01-23 ENCOUNTER — Encounter (HOSPITAL_COMMUNITY): Payer: Self-pay

## 2022-01-23 ENCOUNTER — Telehealth: Payer: Self-pay | Admitting: Family Medicine

## 2022-01-23 ENCOUNTER — Ambulatory Visit: Payer: Managed Care, Other (non HMO) | Admitting: Family Medicine

## 2022-01-23 ENCOUNTER — Emergency Department (HOSPITAL_COMMUNITY): Payer: Commercial Managed Care - HMO

## 2022-01-23 ENCOUNTER — Other Ambulatory Visit: Payer: Self-pay

## 2022-01-23 DIAGNOSIS — R531 Weakness: Secondary | ICD-10-CM | POA: Insufficient documentation

## 2022-01-23 DIAGNOSIS — Y901 Blood alcohol level of 20-39 mg/100 ml: Secondary | ICD-10-CM | POA: Diagnosis not present

## 2022-01-23 DIAGNOSIS — Z794 Long term (current) use of insulin: Secondary | ICD-10-CM | POA: Diagnosis not present

## 2022-01-23 DIAGNOSIS — B9689 Other specified bacterial agents as the cause of diseases classified elsewhere: Secondary | ICD-10-CM | POA: Insufficient documentation

## 2022-01-23 DIAGNOSIS — N390 Urinary tract infection, site not specified: Secondary | ICD-10-CM

## 2022-01-23 DIAGNOSIS — E875 Hyperkalemia: Secondary | ICD-10-CM

## 2022-01-23 DIAGNOSIS — R220 Localized swelling, mass and lump, head: Secondary | ICD-10-CM | POA: Insufficient documentation

## 2022-01-23 DIAGNOSIS — R5383 Other fatigue: Secondary | ICD-10-CM | POA: Diagnosis present

## 2022-01-23 LAB — URINALYSIS, ROUTINE W REFLEX MICROSCOPIC
Bilirubin Urine: NEGATIVE
Glucose, UA: >=500 mg/dL — AB
Ketones, ur: NEGATIVE mg/dL
Nitrite: NEGATIVE
Protein, ur: 100 mg/dL — AB
RBC / HPF: >50 RBC/hpf — ABNORMAL HIGH (ref 0–5)
Specific Gravity, Urine: 1.014 (ref 1.005–1.030)
pH: 5 (ref 5.0–8.0)

## 2022-01-23 LAB — I-STAT VENOUS BLOOD GAS, ED
Acid-base deficit: 1 mmol/L (ref 0.0–2.0)
Bicarbonate: 23.9 mmol/L (ref 20.0–28.0)
Calcium, Ion: 1.1 mmol/L — ABNORMAL LOW (ref 1.15–1.40)
HCT: 28 % — ABNORMAL LOW (ref 39.0–52.0)
Hemoglobin: 9.5 g/dL — ABNORMAL LOW (ref 13.0–17.0)
O2 Saturation: 90 %
Potassium: 5.5 mmol/L — ABNORMAL HIGH (ref 3.5–5.1)
Sodium: 137 mmol/L (ref 135–145)
TCO2: 25 mmol/L (ref 22–32)
pCO2, Ven: 39.7 mmHg — ABNORMAL LOW (ref 44–60)
pH, Ven: 7.387 (ref 7.25–7.43)
pO2, Ven: 59 mmHg — ABNORMAL HIGH (ref 32–45)

## 2022-01-23 LAB — CBC
HCT: 27.8 % — ABNORMAL LOW (ref 39.0–52.0)
Hemoglobin: 8.9 g/dL — ABNORMAL LOW (ref 13.0–17.0)
MCH: 26.7 pg (ref 26.0–34.0)
MCHC: 32 g/dL (ref 30.0–36.0)
MCV: 83.5 fL (ref 80.0–100.0)
Platelets: 342 10*3/uL (ref 150–400)
RBC: 3.33 MIL/uL — ABNORMAL LOW (ref 4.22–5.81)
RDW: 23.5 % — ABNORMAL HIGH (ref 11.5–15.5)
WBC: 7.1 10*3/uL (ref 4.0–10.5)
nRBC: 0 % (ref 0.0–0.2)

## 2022-01-23 LAB — COMPREHENSIVE METABOLIC PANEL
ALT: 34 U/L (ref 0–44)
AST: 34 U/L (ref 15–41)
Albumin: <1.5 g/dL — ABNORMAL LOW (ref 3.5–5.0)
Alkaline Phosphatase: 252 U/L — ABNORMAL HIGH (ref 38–126)
Anion gap: 7 (ref 5–15)
BUN: 19 mg/dL (ref 6–20)
CO2: 22 mmol/L (ref 22–32)
Calcium: 7.6 mg/dL — ABNORMAL LOW (ref 8.9–10.3)
Chloride: 105 mmol/L (ref 98–111)
Creatinine, Ser: 1.56 mg/dL — ABNORMAL HIGH (ref 0.61–1.24)
GFR, Estimated: 51 mL/min — ABNORMAL LOW (ref >60)
Glucose, Bld: 350 mg/dL — ABNORMAL HIGH (ref 70–99)
Potassium: 5.3 mmol/L — ABNORMAL HIGH (ref 3.5–5.1)
Sodium: 134 mmol/L — ABNORMAL LOW (ref 135–145)
Total Bilirubin: 0.8 mg/dL (ref 0.3–1.2)
Total Protein: 5.6 g/dL — ABNORMAL LOW (ref 6.5–8.1)

## 2022-01-23 LAB — LACTIC ACID, PLASMA: Lactic Acid, Venous: 1.7 mmol/L (ref 0.5–1.9)

## 2022-01-23 LAB — ETHANOL: Alcohol, Ethyl (B): 26 mg/dL — ABNORMAL HIGH (ref <10)

## 2022-01-23 LAB — I-STAT BETA HCG BLOOD, ED (MC, WL, AP ONLY): I-stat hCG, quantitative: 7.7 m[IU]/mL — ABNORMAL HIGH (ref <5)

## 2022-01-23 LAB — CBG MONITORING, ED: Glucose-Capillary: 309 mg/dL — ABNORMAL HIGH (ref 70–99)

## 2022-01-23 LAB — BETA-HYDROXYBUTYRIC ACID: Beta-Hydroxybutyric Acid: 0.31 mmol/L — ABNORMAL HIGH (ref 0.05–0.27)

## 2022-01-23 MED ORDER — SODIUM CHLORIDE 0.9 % IV BOLUS
1000.0000 mL | Freq: Once | INTRAVENOUS | Status: AC
  Administered 2022-01-23: 1000 mL via INTRAVENOUS

## 2022-01-23 MED ORDER — SODIUM CHLORIDE 0.9 % IV SOLN
1.0000 g | Freq: Once | INTRAVENOUS | Status: AC
  Administered 2022-01-23: 1 g via INTRAVENOUS
  Filled 2022-01-23: qty 10

## 2022-01-23 NOTE — Discharge Instructions (Addendum)
You were evaluated in the Emergency Department and after careful evaluation, we did not find any emergent condition requiring admission or further testing in the hospital. ? ?Your exam/testing today is overall reassuring.  Recommend follow-up with your primary care doctor.  Take the Keflex antibiotic as directed. ? ?Please return to the Emergency Department if you experience any worsening of your condition.   Thank you for allowing [name] to be a part of your care. ?

## 2022-01-23 NOTE — ED Triage Notes (Signed)
Per [facility] pt coming from home c/o not feeling well x 3 days. Pt reports slurred speech started during his hospital stay earlier this month. Has not had his insulin or metformin today.  ?

## 2022-01-23 NOTE — ED Provider Notes (Signed)
?[facility] ?Provider Note ? ? ?CSN: [id number] ?Arrival date & time: 01/23/22  1721 ? ?  ? ?History ? ?Chief Complaint  ?Patient presents with  ? Hyperglycemia  ? ? ?Jeffrey [name] is a 56 y.o. male. ? ?Patient is a 56 year old male who presents with fatigue.  Per chart review, he was recently admitted for orthostatic hypotension as well as seizure related to alcohol use.  He was also found to have Klebsiella UTI.  He said he has had some ongoing general fatigue.  He does not feel well.  He just feels generally weak.  He denies any chest pain or shortness of breath.  No fevers.  He has had some intermittent nausea and vomiting.  No abdominal pain.  No change in stools.  No urinary symptoms.  He feels like his speech has been slurred since he woke up this morning.  He does not report any dizziness.  He does not have any numbness or weakness to his extremities.  No vision changes. ? ? ?  ? ?Home Medications ?Prior to Admission medications   ?Medication Sig Start Date End Date Taking? Authorizing Provider  ?ascorbic acid (VITAMIN C) 500 MG tablet Take 1 tablet (500 mg total) by mouth 2 (two) times daily. 01/17/22   Holley Bouche, MD  ?blood glucose meter kit and supplies Dispense based on patient and insurance preference. Use up to four times daily as directed. (FOR ICD-10 E10.9, E11.9). dispense 90 test strips and 90 lancets. 09/17/21   Samuella Cota, MD  ?calcium carbonate (OS-CAL - DOSED IN MG OF ELEMENTAL CALCIUM) 1250 (500 Ca) MG tablet Take 1 tablet (500 mg of elemental calcium total) by mouth daily with breakfast. ?Patient not taking: Reported on 01/08/2022 01/01/22 01/31/22  Carollee Leitz, MD  ?Continuous Blood Gluc Sensor (FREESTYLE LIBRE 2 SENSOR) MISC Use as directed ?Patient not taking: Reported on 12/20/2021 10/02/21   [name], MD  ?Dulaglutide (TRULICITY) 7.78 EU/2.3NT SOPN Inject 0.75 mg into the skin once a week. ?Patient taking differently: Inject 0.75  mg into the skin every Monday. 11/29/21   Leavy Cella, RPH-CPP  ?DULoxetine (CYMBALTA) 30 MG capsule Take 1 capsule (30 mg total) by mouth daily. 01/21/22   [name], MD  ?Ensure Max Protein (ENSURE MAX PROTEIN) LIQD Take 330 mLs (11 oz total) by mouth 3 (three) times daily between meals. ?Patient taking differently: Take 11 oz by mouth 2 (two) times daily. 08/10/21   Holley Bouche, MD  ?ferrous sulfate 325 (65 FE) MG tablet Take 1 tablet (325 mg total) by mouth every other day. ?Patient taking differently: Take 325 mg by mouth daily with breakfast. 01/02/22   Carollee Leitz, MD  ?finasteride (PROSCAR) 5 MG tablet Take 1 tablet (5 mg total) by mouth daily. 01/17/22   Holley Bouche, MD  ?folic acid (FOLVITE) 1 MG tablet Take 1 tablet by mouth once daily ?Patient taking differently: Take 1 mg by mouth at bedtime. 10/02/21   [name], MD  ?gabapentin (NEURONTIN) 300 MG capsule Take 2 capsules (600 mg total) by mouth 3 (three) times daily. 12/31/21 01/30/22  Carollee Leitz, MD  ?hydrocerin (EUCERIN) CREA Apply 1 application. topically daily. ?Patient taking differently: Apply 1 application. topically daily as needed (dry skin). 01/01/22   Carollee Leitz, MD  ?levETIRAcetam (KEPPRA) 1000 MG tablet Take 1 tablet (1,000 mg total) by mouth 2 (two) times daily. 09/17/21   Samuella Cota, MD  ?metFORMIN (GLUCOPHAGE-XR) 500 MG 24 hr tablet TAKE  1 TABLET BY MOUTH TWICE DAILY WITH A MEAL ?Patient taking differently: Take 500 mg by mouth in the morning and at bedtime. 12/04/21   [name], MD  ?mirtazapine (REMERON) 30 MG tablet Take 1 tablet (30 mg total) by mouth at bedtime. 01/21/22   [name], MD  ?Multiple Vitamin (MULTIVITAMIN WITH MINERALS) TABS tablet Take 1 tablet by mouth daily. ?Patient taking differently: Take 1 tablet by mouth at bedtime. 08/11/21   Holley Bouche, MD  ?pantoprazole (PROTONIX) 40 MG tablet Take 1 tablet (40 mg total) by mouth 2 (two) times daily. ?Patient taking differently:  Take 40 mg by mouth at bedtime. 09/17/21 01/08/22  Samuella Cota, MD  ?thiamine (VITAMIN B-1) 100 MG tablet Take 1 tablet (100 mg total) by mouth daily. ?Patient taking differently: Take 100 mg by mouth at bedtime. 05/27/16   [name], DO  ?Vitamin D3 (VITAMIN D) 25 MCG tablet Take 1 tablet (1,000 Units total) by mouth daily. ?Patient taking differently: Take 1,000 Units by mouth at bedtime. 01/01/22   Carollee Leitz, MD  ?zinc sulfate 220 (50 Zn) MG capsule Take 1 capsule (220 mg total) by mouth daily. 01/17/22   Holley Bouche, MD  ?   ? ?Allergies    ?Atorvastatin, Drug ingredient [spironolactone], Lisinopril, and Losartan   ? ?Review of Systems   ?Review of Systems  ?Constitutional:  Positive for fatigue. Negative for chills, diaphoresis and fever.  ?HENT:  Negative for congestion, rhinorrhea and sneezing.   ?Eyes: Negative.   ?Respiratory:  Negative for cough, chest tightness and shortness of breath.   ?Cardiovascular:  Negative for chest pain and leg swelling.  ?Gastrointestinal:  Positive for nausea and vomiting. Negative for abdominal pain, blood in stool and diarrhea.  ?Genitourinary:  Negative for difficulty urinating, flank pain, frequency and hematuria.  ?Musculoskeletal:  Negative for arthralgias and back pain.  ?Skin:  Negative for rash.  ?Neurological:  Positive for speech difficulty and weakness (Generalized). Negative for dizziness, numbness and headaches.  ? ?Physical Exam ?Updated Vital Signs ?BP (!) 174/110   Pulse 93   Temp 98 ?F (36.7 ?C) (Oral)   Resp 12   Ht $R'5\' 10"'iV$  (1.778 m)   Wt 105.2 kg   SpO2 99%   BMI 33.29 kg/m?  ?Physical Exam ?Constitutional:   ?   Appearance: He is well-developed.  ?HENT:  ?   Head: Normocephalic and atraumatic.  ?   Comments: Patient seems to have a little bit of swelling to the left side of his face and little swelling versus droopiness of his left upper eyelid.  ?Eyes:  ?   Pupils: Pupils are equal, round, and reactive to light.  ?Cardiovascular:   ?   Rate and Rhythm: Normal rate and regular rhythm.  ?   Heart sounds: Normal heart sounds.  ?Pulmonary:  ?   Effort: Pulmonary effort is normal. No respiratory distress.  ?   Breath sounds: Normal breath sounds. No wheezing or rales.  ?Chest:  ?   Chest wall: No tenderness.  ?Abdominal:  ?   General: Bowel sounds are normal.  ?   Palpations: Abdomen is soft.  ?   Tenderness: There is no abdominal tenderness. There is no guarding or rebound.  ?Musculoskeletal:     ?   General: Normal range of motion.  ?   Cervical back: Normal range of motion and neck supple.  ?Lymphadenopathy:  ?   Cervical: No cervical adenopathy.  ?Skin: ?   General: Skin is warm  and dry.  ?   Findings: No rash.  ?Neurological:  ?   Mental Status: He is alert and oriented to person, place, and time.  ?   Comments: Motor 5/5 all extremities ?Sensation grossly intact to LT all extremities ?Finger to Nose intact, no pronator drift ?CN II-XII grossly intact ? ?  ? ? ?ED Results / Procedures / Treatments   ?Labs ?(all labs ordered are listed, but only abnormal results are displayed) ?Labs Reviewed  ?CBC - Abnormal; Notable for the following components:  ?    Result Value  ? RBC 3.33 (*)   ? Hemoglobin 8.9 (*)   ? HCT 27.8 (*)   ? RDW 23.5 (*)   ? All other components within normal limits  ?COMPREHENSIVE METABOLIC PANEL - Abnormal; Notable for the following components:  ? Sodium 134 (*)   ? Potassium 5.3 (*)   ? Glucose, Bld 350 (*)   ? Creatinine, Ser 1.56 (*)   ? Calcium 7.6 (*)   ? Total Protein 5.6 (*)   ? Albumin <1.5 (*)   ? Alkaline Phosphatase 252 (*)   ? GFR, Estimated 51 (*)   ? All other components within normal limits  ?BETA-HYDROXYBUTYRIC ACID - Abnormal; Notable for the following components:  ? Beta-Hydroxybutyric Acid 0.31 (*)   ? All other components within normal limits  ?URINALYSIS, ROUTINE W REFLEX MICROSCOPIC - Abnormal; Notable for the following components:  ? APPearance HAZY (*)   ? Glucose, UA >=500 (*)   ? Hgb urine dipstick  LARGE (*)   ? Protein, ur 100 (*)   ? Leukocytes,Ua TRACE (*)   ? RBC / HPF >50 (*)   ? Bacteria, UA MANY (*)   ? All other components within normal limits  ?ETHANOL - Abnormal; Notable for the following comp

## 2022-01-23 NOTE — ED Notes (Signed)
Urine culture sent.

## 2022-01-23 NOTE — ED Provider Triage Note (Signed)
Emergency Medicine Provider Triage Evaluation Note ? ?Jeffrey [name] , a 56 y.o. male  was evaluated in triage.  Pt complains of "not feeling well."  Says he has been feeling this way since he was discharged from the hospital 2 weeks ago.  When I asked if he is just feeling fatigued and weak he says "yes some of that to."  When asked to elaborate on why he is not feeling well he just repeats that he does not feel well. ? ?EMS says that his blood sugar was in the 400s ? ?Review of Systems  ?Just does not feel well ?Physical Exam  ?BP (!) 180/108 (BP Location: Right Arm)   Pulse 99   Temp 98.4 ?F (36.9 ?C) (Oral)   Resp 16   Ht '5\' 10"'$  (1.778 m)   Wt 105.2 kg   SpO2 97%   BMI 33.29 kg/m?  ?Gen:   Awake, no distress   ?Resp:  Normal effort  ?MSK:   Moves extremities without difficulty  ?Other:  Ill-appearing and odorous ? ?Medical Decision Making  ?Medically screening exam initiated at 5:46 PM.  Appropriate orders placed.  Jeffrey [name] was informed that the remainder of the evaluation will be completed by another provider, this initial triage assessment does not replace that evaluation, and the importance of remaining in the ED until their evaluation is complete. ? ? ? ?Per chart review, patient was admitted for a seizure on 4/4.  He was found to be hyperkalemic.  Also I was able to note some history of alcohol use, ethanol added onto labs ?  ?Rhae Hammock, PA-C ?01/23/22 1749 ? ?

## 2022-01-23 NOTE — ED Triage Notes (Signed)
Pt states he has not felt well and his CBG is high.  ?

## 2022-01-24 ENCOUNTER — Emergency Department (HOSPITAL_COMMUNITY): Payer: Commercial Managed Care - HMO

## 2022-01-24 LAB — CBG MONITORING, ED: Glucose-Capillary: 317 mg/dL — ABNORMAL HIGH (ref 70–99)

## 2022-01-24 MED ORDER — SODIUM ZIRCONIUM CYCLOSILICATE 10 G PO PACK: 10.0000 g | PACK | Freq: Once | ORAL | Status: DC

## 2022-01-24 MED ORDER — CEPHALEXIN 500 MG PO CAPS: 500.0000 mg | ORAL_CAPSULE | Freq: Three times a day (TID) | ORAL | 0 refills | Status: AC

## 2022-01-24 NOTE — Telephone Encounter (Signed)
Late note entry ?Patient missed his appointment on 01/23/2022.  Called patient's emergency contact and was unable to reach them as well.  Called nonemergent [place] [employer] line to have a wellness check.  Received call from patient after the wellness check and when I spoke with him he was slurring his speech he reported that he was having fatigue and weakness.  Recommended he be evaluated at the emergency department.  He was going to have his sister drive him to the ED. ?

## 2022-01-24 NOTE — ED Provider Notes (Signed)
?  Provider Note ?MRN:  [id number]  ?Arrival date & time: 01/24/22    ?ED Course and Medical Decision Making  ?Assumed care from Dr. Tamera Punt at shift change. ? ?Weakness, possible speech disturbance awaiting MRI to exclude stroke.  Patient also has evidence of UTI.  Would be a candidate for discharge on antibiotics if MRI is without acute process. ? ?MRI without acute stroke.  On reassessment patient is well-appearing, no significant complaints at this time.  Appropriate for discharge. ? ?Procedures ? ?Final Clinical Impressions(s) / ED Diagnoses  ? ?  ICD-10-CM   ?1. Lower urinary tract infectious disease  N39.0   ?  ?2. Hyperkalemia  E87.5   ?  ?  ?ED Discharge Orders   ? ?      Ordered  ?  cephALEXin (KEFLEX) 500 MG capsule  3 times daily       ? 01/24/22 0450  ? ?  ?  ? ?  ?  ? ? ?Discharge Instructions   ? ?  ?You were evaluated in the Emergency Department and after careful evaluation, we did not find any emergent condition requiring admission or further testing in the hospital. ? ?Your exam/testing today is overall reassuring.  Recommend follow-up with your primary care doctor.  Take the Keflex antibiotic as directed. ? ?Please return to the Emergency Department if you experience any worsening of your condition.   Thank you for allowing [name] to be a part of your care. ? ? ? ? ? ?Barth Kirks. Sedonia Small, MD ?[facility] Emergency Medicine ?[email or web address] ? ?  ?Maudie Flakes, MD ?01/24/22 [phone] ? ?

## 2022-01-26 LAB — URINE CULTURE: Culture: >=100000 — AB

## 2022-01-27 ENCOUNTER — Telehealth (HOSPITAL_BASED_OUTPATIENT_CLINIC_OR_DEPARTMENT_OTHER): Payer: Self-pay | Admitting: *Deleted

## 2022-01-27 NOTE — Telephone Encounter (Signed)
Post ED Visit - Positive Culture Follow-up ? ?Culture report reviewed by antimicrobial stewardship pharmacist: ?[facility] Team ?'[x]'$  Lorelei Pont, harm.D. ?'[]'$  Heide Guile, Pharm.D., BCPS AQ-ID ?'[]'$  Parks Neptune, Pharm.D., BCPS ?'[]'$  Alycia Rossetti, Pharm.D., BCPS ?'[]'$  [place], Pharm.D., BCPS, AAHIVP ?'[]'$  Legrand Como, Pharm.D., BCPS, AAHIVP ?'[]'$  Salome Arnt, PharmD, BCPS ?'[]'$  Johnnette Gourd, PharmD, BCPS ?'[]'$  Hughes Better, PharmD, BCPS ?'[]'$  Leeroy Cha, PharmD ?'[]'$  Laqueta Linden, PharmD, BCPS ?'[]'$  Albertina Parr, PharmD ? ?[place] Team ?'[]'$  Leodis Sias, PharmD ?'[]'$  Lindell Spar, PharmD ?'[]'$  Royetta Asal, PharmD ?'[]'$  Graylin Shiver, Rph ?'[]'$  Rema Fendt) Glennon Mac, PharmD ?'[]'$  Arlyn Dunning, PharmD ?'[]'$  Netta Cedars, PharmD ?'[]'$  Dia Sitter, PharmD ?'[]'$  Leone Haven, PharmD ?'[]'$  Gretta Arab, PharmD ?'[]'$  Theodis Shove, PharmD ?'[]'$  Peggyann Juba, PharmD ?'[]'$  Reuel Boom, PharmD ? ? ?Positive urine culture ?Treated with Cephalexin, organism sensitive to the same and no further patient follow-up is required at this time. ? ?Rosie Fate ?01/27/2022, 1:18 PM ?  ?

## 2022-02-02 ENCOUNTER — Inpatient Hospital Stay (HOSPITAL_COMMUNITY): Payer: Commercial Managed Care - HMO

## 2022-02-02 ENCOUNTER — Inpatient Hospital Stay (HOSPITAL_COMMUNITY)
Admission: EM | Admit: 2022-02-02 | Discharge: 2022-02-11 | DRG: 368 | Disposition: A | Discharge status: Skilled Nursing Facility | Payer: Commercial Managed Care - HMO | Attending: Family Medicine | Admitting: Family Medicine

## 2022-02-02 ENCOUNTER — Encounter (HOSPITAL_COMMUNITY): Payer: Self-pay | Admitting: Student

## 2022-02-02 DIAGNOSIS — Z833 Family history of diabetes mellitus: Secondary | ICD-10-CM

## 2022-02-02 DIAGNOSIS — E7800 Pure hypercholesterolemia, unspecified: Secondary | ICD-10-CM | POA: Diagnosis present

## 2022-02-02 DIAGNOSIS — I129 Hypertensive chronic kidney disease with stage 1 through stage 4 chronic kidney disease, or unspecified chronic kidney disease: Secondary | ICD-10-CM | POA: Diagnosis present

## 2022-02-02 DIAGNOSIS — N179 Acute kidney failure, unspecified: Secondary | ICD-10-CM

## 2022-02-02 DIAGNOSIS — I472 Ventricular tachycardia, unspecified: Secondary | ICD-10-CM | POA: Diagnosis not present

## 2022-02-02 DIAGNOSIS — D689 Coagulation defect, unspecified: Secondary | ICD-10-CM | POA: Diagnosis present

## 2022-02-02 DIAGNOSIS — D62 Acute posthemorrhagic anemia: Secondary | ICD-10-CM | POA: Diagnosis present

## 2022-02-02 DIAGNOSIS — G931 Anoxic brain damage, not elsewhere classified: Secondary | ICD-10-CM | POA: Diagnosis present

## 2022-02-02 DIAGNOSIS — Z79899 Other long term (current) drug therapy: Secondary | ICD-10-CM

## 2022-02-02 DIAGNOSIS — E8809 Other disorders of plasma-protein metabolism, not elsewhere classified: Secondary | ICD-10-CM | POA: Diagnosis present

## 2022-02-02 DIAGNOSIS — E1165 Type 2 diabetes mellitus with hyperglycemia: Secondary | ICD-10-CM | POA: Diagnosis present

## 2022-02-02 DIAGNOSIS — K449 Diaphragmatic hernia without obstruction or gangrene: Secondary | ICD-10-CM | POA: Diagnosis present

## 2022-02-02 DIAGNOSIS — K766 Portal hypertension: Secondary | ICD-10-CM | POA: Diagnosis present

## 2022-02-02 DIAGNOSIS — Z6834 Body mass index (BMI) 34.0-34.9, adult: Secondary | ICD-10-CM

## 2022-02-02 DIAGNOSIS — E1169 Type 2 diabetes mellitus with other specified complication: Secondary | ICD-10-CM | POA: Diagnosis present

## 2022-02-02 DIAGNOSIS — F32A Depression, unspecified: Secondary | ICD-10-CM | POA: Diagnosis present

## 2022-02-02 DIAGNOSIS — K922 Gastrointestinal hemorrhage, unspecified: Secondary | ICD-10-CM | POA: Diagnosis present

## 2022-02-02 DIAGNOSIS — E11649 Type 2 diabetes mellitus with hypoglycemia without coma: Secondary | ICD-10-CM | POA: Diagnosis not present

## 2022-02-02 DIAGNOSIS — K264 Chronic or unspecified duodenal ulcer with hemorrhage: Secondary | ICD-10-CM | POA: Diagnosis present

## 2022-02-02 DIAGNOSIS — L309 Dermatitis, unspecified: Secondary | ICD-10-CM | POA: Diagnosis present

## 2022-02-02 DIAGNOSIS — D631 Anemia in chronic kidney disease: Secondary | ICD-10-CM | POA: Diagnosis present

## 2022-02-02 DIAGNOSIS — E1140 Type 2 diabetes mellitus with diabetic neuropathy, unspecified: Secondary | ICD-10-CM | POA: Diagnosis not present

## 2022-02-02 DIAGNOSIS — Z888 Allergy status to other drugs, medicaments and biological substances status: Secondary | ICD-10-CM

## 2022-02-02 DIAGNOSIS — N1831 Chronic kidney disease, stage 3a: Secondary | ICD-10-CM

## 2022-02-02 DIAGNOSIS — E46 Unspecified protein-calorie malnutrition: Secondary | ICD-10-CM | POA: Diagnosis present

## 2022-02-02 DIAGNOSIS — I872 Venous insufficiency (chronic) (peripheral): Secondary | ICD-10-CM | POA: Diagnosis present

## 2022-02-02 DIAGNOSIS — K7460 Unspecified cirrhosis of liver: Secondary | ICD-10-CM | POA: Diagnosis present

## 2022-02-02 DIAGNOSIS — Z8674 Personal history of sudden cardiac arrest: Secondary | ICD-10-CM

## 2022-02-02 DIAGNOSIS — F1010 Alcohol abuse, uncomplicated: Secondary | ICD-10-CM

## 2022-02-02 DIAGNOSIS — Z7985 Long-term (current) use of injectable non-insulin antidiabetic drugs: Secondary | ICD-10-CM

## 2022-02-02 DIAGNOSIS — K3189 Other diseases of stomach and duodenum: Secondary | ICD-10-CM | POA: Diagnosis not present

## 2022-02-02 DIAGNOSIS — Z794 Long term (current) use of insulin: Secondary | ICD-10-CM | POA: Diagnosis not present

## 2022-02-02 DIAGNOSIS — E871 Hypo-osmolality and hyponatremia: Secondary | ICD-10-CM | POA: Diagnosis present

## 2022-02-02 DIAGNOSIS — G40909 Epilepsy, unspecified, not intractable, without status epilepticus: Secondary | ICD-10-CM | POA: Diagnosis present

## 2022-02-02 DIAGNOSIS — K2101 Gastro-esophageal reflux disease with esophagitis, with bleeding: Principal | ICD-10-CM | POA: Diagnosis present

## 2022-02-02 DIAGNOSIS — K5790 Diverticulosis of intestine, part unspecified, without perforation or abscess without bleeding: Secondary | ICD-10-CM | POA: Diagnosis present

## 2022-02-02 DIAGNOSIS — E2749 Other adrenocortical insufficiency: Secondary | ICD-10-CM | POA: Diagnosis present

## 2022-02-02 DIAGNOSIS — G4733 Obstructive sleep apnea (adult) (pediatric): Secondary | ICD-10-CM | POA: Diagnosis present

## 2022-02-02 DIAGNOSIS — K5900 Constipation, unspecified: Secondary | ICD-10-CM | POA: Diagnosis present

## 2022-02-02 DIAGNOSIS — K2211 Ulcer of esophagus with bleeding: Secondary | ICD-10-CM | POA: Diagnosis present

## 2022-02-02 DIAGNOSIS — F419 Anxiety disorder, unspecified: Secondary | ICD-10-CM | POA: Diagnosis present

## 2022-02-02 DIAGNOSIS — K7031 Alcoholic cirrhosis of liver with ascites: Secondary | ICD-10-CM | POA: Diagnosis present

## 2022-02-02 DIAGNOSIS — D638 Anemia in other chronic diseases classified elsewhere: Secondary | ICD-10-CM

## 2022-02-02 DIAGNOSIS — K921 Melena: Secondary | ICD-10-CM

## 2022-02-02 DIAGNOSIS — Z720 Tobacco use: Secondary | ICD-10-CM

## 2022-02-02 DIAGNOSIS — E1122 Type 2 diabetes mellitus with diabetic chronic kidney disease: Secondary | ICD-10-CM | POA: Diagnosis present

## 2022-02-02 DIAGNOSIS — Z87440 Personal history of urinary (tract) infections: Secondary | ICD-10-CM

## 2022-02-02 DIAGNOSIS — N170 Acute kidney failure with tubular necrosis: Secondary | ICD-10-CM | POA: Diagnosis present

## 2022-02-02 DIAGNOSIS — K08409 Partial loss of teeth, unspecified cause, unspecified class: Secondary | ICD-10-CM | POA: Diagnosis present

## 2022-02-02 DIAGNOSIS — E860 Dehydration: Secondary | ICD-10-CM | POA: Diagnosis present

## 2022-02-02 DIAGNOSIS — E8770 Fluid overload, unspecified: Secondary | ICD-10-CM | POA: Diagnosis present

## 2022-02-02 DIAGNOSIS — Z7984 Long term (current) use of oral hypoglycemic drugs: Secondary | ICD-10-CM

## 2022-02-02 DIAGNOSIS — D649 Anemia, unspecified: Secondary | ICD-10-CM

## 2022-02-02 DIAGNOSIS — R195 Other fecal abnormalities: Secondary | ICD-10-CM

## 2022-02-02 LAB — HEMOGLOBIN A1C
Hgb A1c MFr Bld: 8.3 % — ABNORMAL HIGH (ref 4.8–5.6)
Mean Plasma Glucose: 191.51 mg/dL

## 2022-02-02 LAB — PROTEIN, PLEURAL OR PERITONEAL FLUID: Total protein, fluid: <3 g/dL

## 2022-02-02 LAB — COMPREHENSIVE METABOLIC PANEL
ALT: 24 U/L (ref 0–44)
AST: 26 U/L (ref 15–41)
Albumin: <1.5 g/dL — ABNORMAL LOW (ref 3.5–5.0)
Alkaline Phosphatase: 165 U/L — ABNORMAL HIGH (ref 38–126)
Anion gap: 12 (ref 5–15)
BUN: 28 mg/dL — ABNORMAL HIGH (ref 6–20)
CO2: 15 mmol/L — ABNORMAL LOW (ref 22–32)
Calcium: 6.9 mg/dL — ABNORMAL LOW (ref 8.9–10.3)
Chloride: 102 mmol/L (ref 98–111)
Creatinine, Ser: 3.59 mg/dL — ABNORMAL HIGH (ref 0.61–1.24)
GFR, Estimated: 19 mL/min — ABNORMAL LOW (ref >60)
Glucose, Bld: 541 mg/dL (ref 70–99)
Potassium: 4.4 mmol/L (ref 3.5–5.1)
Sodium: 129 mmol/L — ABNORMAL LOW (ref 135–145)
Total Bilirubin: 0.1 mg/dL — ABNORMAL LOW (ref 0.3–1.2)
Total Protein: 4.8 g/dL — ABNORMAL LOW (ref 6.5–8.1)

## 2022-02-02 LAB — ALBUMIN, PLEURAL OR PERITONEAL FLUID: Albumin, Fluid: <1.5 g/dL

## 2022-02-02 LAB — CBC WITH DIFFERENTIAL/PLATELET
Abs Immature Granulocytes: 0.1 10*3/uL — ABNORMAL HIGH (ref 0.00–0.07)
Basophils Absolute: 0.1 10*3/uL (ref 0.0–0.1)
Basophils Relative: 1 %
Eosinophils Absolute: 0.1 10*3/uL (ref 0.0–0.5)
Eosinophils Relative: 1 %
HCT: 24.9 % — ABNORMAL LOW (ref 39.0–52.0)
Hemoglobin: 8.1 g/dL — ABNORMAL LOW (ref 13.0–17.0)
Immature Granulocytes: 1 %
Lymphocytes Relative: 23 %
Lymphs Abs: 2.1 10*3/uL (ref 0.7–4.0)
MCH: 27.8 pg (ref 26.0–34.0)
MCHC: 32.5 g/dL (ref 30.0–36.0)
MCV: 85.6 fL (ref 80.0–100.0)
Monocytes Absolute: 0.8 10*3/uL (ref 0.1–1.0)
Monocytes Relative: 9 %
Neutro Abs: 6.2 10*3/uL (ref 1.7–7.7)
Neutrophils Relative %: 65 %
Platelets: 378 10*3/uL (ref 150–400)
RBC: 2.91 MIL/uL — ABNORMAL LOW (ref 4.22–5.81)
RDW: 23.3 % — ABNORMAL HIGH (ref 11.5–15.5)
WBC: 9.4 10*3/uL (ref 4.0–10.5)
nRBC: 0 % (ref 0.0–0.2)

## 2022-02-02 LAB — GLUCOSE, CAPILLARY
Glucose-Capillary: 214 mg/dL — ABNORMAL HIGH (ref 70–99)
Glucose-Capillary: 256 mg/dL — ABNORMAL HIGH (ref 70–99)
Glucose-Capillary: 237 mg/dL — ABNORMAL HIGH (ref 70–99)

## 2022-02-02 LAB — GRAM STAIN

## 2022-02-02 LAB — BODY FLUID CELL COUNT WITH DIFFERENTIAL
Eos, Fluid: 0 %
Lymphs, Fluid: 40 %
Monocyte-Macrophage-Serous Fluid: 50 % (ref 50–90)
Neutrophil Count, Fluid: 10 % (ref 0–25)
Total Nucleated Cell Count, Fluid: 74 cu mm (ref 0–1000)

## 2022-02-02 LAB — CBG MONITORING, ED
Glucose-Capillary: 402 mg/dL — ABNORMAL HIGH (ref 70–99)
Glucose-Capillary: 289 mg/dL — ABNORMAL HIGH (ref 70–99)

## 2022-02-02 LAB — PROTIME-INR
INR: 1.4 — ABNORMAL HIGH (ref 0.8–1.2)
Prothrombin Time: 16.7 seconds — ABNORMAL HIGH (ref 11.4–15.2)

## 2022-02-02 LAB — POC OCCULT BLOOD, ED: Fecal Occult Bld: POSITIVE — AB

## 2022-02-02 MED ORDER — THIAMINE HCL 100 MG PO TABS
100.0000 mg | ORAL_TABLET | Freq: Every day | ORAL | Status: DC
Start: 2022-02-02 — End: 2022-02-12
  Administered 2022-02-02 – 2022-02-11 (×10): 100 mg via ORAL
  Filled 2022-02-02 (×10): qty 1

## 2022-02-02 MED ORDER — ADULT MULTIVITAMIN W/MINERALS CH
1.0000 | ORAL_TABLET | Freq: Every day | ORAL | Status: DC
  Administered 2022-02-02 – 2022-02-11 (×10): 1 via ORAL
  Filled 2022-02-02 (×10): qty 1

## 2022-02-02 MED ORDER — ROSUVASTATIN CALCIUM 5 MG PO TABS
5.0000 mg | ORAL_TABLET | Freq: Every day | ORAL | Status: DC
  Administered 2022-02-02 – 2022-02-11 (×10): 5 mg via ORAL
  Filled 2022-02-02 (×10): qty 1

## 2022-02-02 MED ORDER — SODIUM CHLORIDE 0.9 % IV SOLN
1.0000 g | INTRAVENOUS | Status: AC
  Administered 2022-02-02 – 2022-02-08 (×7): 1 g via INTRAVENOUS
  Filled 2022-02-02 (×7): qty 10

## 2022-02-02 MED ORDER — INSULIN ASPART 100 UNIT/ML IJ SOLN
10.0000 [IU] | Freq: Once | INTRAMUSCULAR | Status: AC
  Administered 2022-02-02: 10 [IU] via INTRAVENOUS

## 2022-02-02 MED ORDER — PANTOPRAZOLE SODIUM 40 MG PO TBEC
40.0000 mg | DELAYED_RELEASE_TABLET | Freq: Two times a day (BID) | ORAL | Status: DC
  Administered 2022-02-02 – 2022-02-11 (×20): 40 mg via ORAL
  Filled 2022-02-02 (×20): qty 1

## 2022-02-02 MED ORDER — LEVETIRACETAM 500 MG PO TABS
1000.0000 mg | ORAL_TABLET | Freq: Two times a day (BID) | ORAL | Status: DC
  Administered 2022-02-02 – 2022-02-03 (×3): 1000 mg via ORAL
  Filled 2022-02-02 (×3): qty 2

## 2022-02-02 MED ORDER — SODIUM CHLORIDE 0.9 % IV BOLUS
1000.0000 mL | Freq: Once | INTRAVENOUS | Status: AC
  Administered 2022-02-02: 1000 mL via INTRAVENOUS

## 2022-02-02 MED ORDER — BOOST / RESOURCE BREEZE PO LIQD CUSTOM
1.0000 | Freq: Three times a day (TID) | ORAL | Status: DC
  Administered 2022-02-03 – 2022-02-11 (×17): 1 via ORAL

## 2022-02-02 MED ORDER — HYDROCORTISONE 10 MG PO TABS
10.0000 mg | ORAL_TABLET | Freq: Every evening | ORAL | Status: DC
  Administered 2022-02-02 – 2022-02-04 (×3): 10 mg via ORAL
  Filled 2022-02-02 (×3): qty 1

## 2022-02-02 MED ORDER — INSULIN ASPART 100 UNIT/ML IJ SOLN
0.0000 [IU] | Freq: Three times a day (TID) | INTRAMUSCULAR | Status: DC
  Administered 2022-02-02: 3 [IU] via SUBCUTANEOUS
  Administered 2022-02-02 – 2022-02-03 (×2): 2 [IU] via SUBCUTANEOUS
  Administered 2022-02-03: 3 [IU] via SUBCUTANEOUS
  Administered 2022-02-04: 2 [IU] via SUBCUTANEOUS
  Administered 2022-02-04: 4 [IU] via SUBCUTANEOUS
  Administered 2022-02-04: 2 [IU] via SUBCUTANEOUS
  Administered 2022-02-05: 1 [IU] via SUBCUTANEOUS
  Administered 2022-02-05 (×2): 2 [IU] via SUBCUTANEOUS
  Administered 2022-02-06: 1 [IU] via SUBCUTANEOUS

## 2022-02-02 MED ORDER — HYDROCORTISONE 5 MG PO TABS
15.0000 mg | ORAL_TABLET | Freq: Every morning | ORAL | Status: DC
  Administered 2022-02-02 – 2022-02-05 (×4): 15 mg via ORAL
  Filled 2022-02-02 (×4): qty 1

## 2022-02-02 MED ORDER — MIRTAZAPINE 15 MG PO TABS
30.0000 mg | ORAL_TABLET | Freq: Every day | ORAL | Status: DC
  Administered 2022-02-02 – 2022-02-11 (×10): 30 mg via ORAL
  Filled 2022-02-02 (×5): qty 2
  Filled 2022-02-02: qty 1
  Filled 2022-02-02 (×4): qty 2

## 2022-02-02 MED ORDER — LIDOCAINE HCL (PF) 1 % IJ SOLN
INTRAMUSCULAR | Status: AC
  Filled 2022-02-02: qty 30

## 2022-02-02 NOTE — Progress Notes (Signed)
Rectal temp 98.45F.  ? ?Retail banker removed and on standby. ?

## 2022-02-02 NOTE — ED Notes (Signed)
Patient transported to X-ray 

## 2022-02-02 NOTE — H&P (View-Only) (Signed)
? ?                                            Consultation Note ? ? ?Referring Provider: Family Medicine teaching service ?PCP: [name], MD ?Primary Gastroenterologist: Althia Forts ?Reason for consultation: melena  ?Hospital Day: 1 ? ?ASSESSMENT:  ? ?56 yo male with black stool (at time of last BM 5 days ago)  / FOBT+ / acute on chronic anemia.  ?Multiple admissions / endoscopies for the same with history of severe esophagitis, gastritis and duodenitis. However, most recent EGD mid March was normal . He has Etoh cirrhosis without findings of portal hypertension on recent EGD. Could have developed interval upper GI lesion such as recurrence of severe esophagitis or portal hypertension still drinking Etoh.   ? ?Etoh cirrhosis, probably decompensated.  ?MELD 27 ?Still consumes Etoh ?Coagulopathic. Severe hypoalbuminemia, Ascites on [name], AKI on CKD ?Need to rule out SBP though not complaining of abdominal pain and WBC is normal.  ? ?AKI on CKD.  ?Creatinine is also up from 1.5 to 3.59. Home lasix is on hold ? ?Diabetes ?glucose 541 ? ?Secondary adrenal insufficiency.  ? ?See PMH for additional medical problems ? ? ?PLAN:  ? ?Paracentesis already ordered by admitting team. Will make sure cell count is done.  ?Rocephin for SBP prophylaxis ?EGD tomorrow if blood glucose improved.  The risks and benefits of EGD with possible biopsies were discussed with the patient who agrees to proceed.  ?Continue PPI ?May need renal consult if renal function doesn't quickly improve. Home lasix is on hold.  ?2 gram sodium restricted diet ?No evidence for encephalopathy. ?Stop Etoh ?Will review records. If complete hepatic serologic evaluation hasn't been done then will order it. Cirrhosis probably etoh but other etiologies need to be excluded.  ? ? ?Attending Physician Note  ? ?I have taken a history, reviewed the chart and examined the patient. I performed a substantive portion of this encounter, including complete performance of  at least one of the key components, in conjunction with the APP. I agree with the APP's note, impression and recommendations with my edits. My additional impressions and recommendations are as follows.  ? ?*History of melena, heme + stool, worsening anemia. Colonoscopy March 2023 showed only diverticulosis and EGD in March 2023 was normal. R/O ulcer, portal gastropathy, recurrent esophagitis, AVMs and other causes. PPI, trend CBC, transfusion to maintain Hb > 7, Rocephin. EGD tomorrow to further evaluate.  ? ?*Decompensated alcoholic cirrhosis with ascites. MELD=27. Stop alcohol. Diagnostic paracentesis - R/O SBP, check SAAG.  ? ?*AKI on CKD. Consider renal consult, defer to primary service.  ? ?Jeffrey Edward, MD [facility] ?See [facility], [facility], for our on call provider  ? ? ? ?History of Present Illness:  ?Jeffrey [name] is a 56 y.o. male  with multiple medical problems not limited to  Etoh abuse, Etoh cirrhosis, hiatal hernia, GERD / erosive esophagitis, duodenitis, PUD seizure disorder, PEA arrest and anoxic brain injury, DM, HTN, chronic diastolic heart failure, history of C. difficile infection.  See PMH for any additional medical problems. ? ?Jeffrey [name] has had several admissions and upper endoscopies for upper GI bleeding / anemia.  He has a history of severe erosive esophagitis, gastritis and duodenitis but his most recent EGD mid March was normal.  He also had a unremarkable colonoscopy at that time (except for diverticulosis).  During  that admission he received 3 units of PRBCs for hemoglobin in the 7 range.  ? ?Reis was hospitalized earlier this month with weakness, falls and self-reported seizure.  Unfortunately he had been drinking alcohol.  He was also treated for UTI.  During that admission his hemoglobin had stayed remained stable since blood transfusion the prior admission in March. ? ?Interval history ?Patient presented to ED today for evaluation of weakness and black stool.  He has not had a bowel  movement since Monday (5 days ago) but on Monday his bowel movement was black.  He denies abdominal pain or nausea/vomiting.  He says that he is compliant with pantoprazole.  He does not take NSAIDs.  He was apparently supposed to start oral iron but has not done so.[name]  No Pepto use.   His hemoglobin in the ED is 8.1, down from 9.510 days ago.  His creatinine is also elevated from 1.5 to 3.59. He does admit to drinking Etoh yesterday . He has anasarca, albumin in < 1.5. His glucose is 541 ? ? ?Previous GI Evaluation / History   ? ?2014 EGD for GI bleed ?--ulcer at GE junction ? ?Jan 2016 EGD for GI bleed ?ENDOSCOPIC IMPRESSION: ?1. LA Class C esophagitis ?2. 5 cm hiatal hernia ?3. Mild duodenitis ? ?Jan 2022 EGD for GI bleed ?Severe reflux esophagitis ?2. Mild gastritis ?3. No varices. No portal hypertensive gastropathy ?4. Duodenal ulceration as described. Changes suggest ischemia of some sort ?5. No active bleeding or high risk stigmata. ? ?March 18th, 2023 EGD and colonoscopy for suspected GI bleed and IDA ?- No gross lesions in esophagus. ?- Normal stomach. ?- Normal examined duodenum. ?- No specimens collected ? ?-  Diverticulosis. ?- The examination was otherwise normal on direct and retroflexion views. ?- No specimens collected ? ? ?Recent Labs and Imaging ?X-ray chest PA and lateral ? ?Result Date: 02/02/2022 ?CLINICAL DATA:  GI bleeding since yesterday. EXAM: CHEST - 2 VIEW COMPARISON:  01/08/2022 FINDINGS: Low volume film. Cardiopericardial silhouette is at upper limits of normal for size. Left base atelectasis or infiltrate. Right lung clear. No substantial pleural effusion. Telemetry leads overlie the chest. IMPRESSION: Low volume film with left base atelectasis or infiltrate. Electronically Signed   By: Misty Stanley M.D.   On: 02/02/2022 09:33  ? ?DG Abd 1 View ? ?Result Date: 02/02/2022 ?CLINICAL DATA:  GI bleeding since yesterday EXAM: ABDOMEN - 1 VIEW COMPARISON:  09/13/2021 FINDINGS: No gaseous bowel  dilatation. Air is seen in the nondilated transverse and descending colon with mild gaseous distention of the sigmoid colon, within normal limits. Pancreatic calcification compatible with chronic pancreatitis. Mild degenerative changes in the hips. IMPRESSION: 1. Nonobstructive bowel gas pattern. 2. Pancreatic calcification compatible with chronic pancreatitis. Electronically Signed   By: Misty Stanley M.D.   On: 02/02/2022 09:35  ? ?CT Head Wo Contrast ? ?Result Date: 01/23/2022 ?CLINICAL DATA:  History of seizures, patient not feeling well. EXAM: CT HEAD WITHOUT CONTRAST TECHNIQUE: Contiguous axial images were obtained from the base of the skull through the vertex without intravenous contrast. RADIATION DOSE REDUCTION: This exam was performed according to the departmental dose-optimization program which includes automated exposure control, adjustment of the mA and/or kV according to patient size and/or use of iterative reconstruction technique. COMPARISON:  January 08, 2022 FINDINGS: Brain: No evidence of acute infarction, hemorrhage, hydrocephalus, extra-axial collection or mass lesion/mass effect. Vascular: No hyperdense vessel or unexpected calcification. Skull: Normal. Negative for fracture or focal lesion. Sinuses/Orbits: There is  moderate severity bilateral maxillary sinus mucosal thickening. Other: None. IMPRESSION: 1. No acute intracranial abnormality. 2. Moderate severity bilateral maxillary sinus mucosal thickening. Electronically Signed   By: Virgina Norfolk M.D.   On: 01/23/2022 23:09  ? ?CT HEAD WO CONTRAST (5MM) ? ?Result Date: 01/08/2022 ?CLINICAL DATA:  Unwitnessed fall. EXAM: CT HEAD WITHOUT CONTRAST CT MAXILLOFACIAL WITHOUT CONTRAST TECHNIQUE: Multidetector CT imaging of the head and maxillofacial structures were performed using the standard protocol without intravenous contrast. Multiplanar CT image reconstructions of the maxillofacial structures were also generated. RADIATION DOSE REDUCTION: This  exam was performed according to the departmental dose-optimization program which includes automated exposure control, adjustment of the mA and/or kV according to patient size and/or use of iterative re

## 2022-02-02 NOTE — ED Provider Notes (Signed)
?[facility] ?Provider Note ? ? ?CSN: [id number] ?Arrival date & time: 02/02/22  0321 ? ?  ? ?History ? ?No chief complaint on file. ? ? ?Jeffrey [name] is a 56 y.o. male. ? ?Patient is a 56 year old male with extensive past medical history including diabetes, hyperlipidemia, chronic renal insufficiency, EtOH abuse, cirrhosis, hypertension.  Patient presenting today for evaluation of weakness and black stools.  This started yesterday and is worsening.  He denies to me he is having any fevers or chills.  He denies any abdominal pain.  On EGD in January 2022, patient had severe erosive gastritis, however most recent EGD in March did not show this. ? ?The history is provided by the patient.  ? ?  ? ?Home Medications ?Prior to Admission medications   ?Medication Sig Start Date End Date Taking? Authorizing Provider  ?ascorbic acid (VITAMIN C) 500 MG tablet Take 1 tablet (500 mg total) by mouth 2 (two) times daily. 01/17/22   Holley Bouche, MD  ?blood glucose meter kit and supplies Dispense based on patient and insurance preference. Use up to four times daily as directed. (FOR ICD-10 E10.9, E11.9). dispense 90 test strips and 90 lancets. 09/17/21   Samuella Cota, MD  ?calcium carbonate (OS-CAL - DOSED IN MG OF ELEMENTAL CALCIUM) 1250 (500 Ca) MG tablet Take 1 tablet (500 mg of elemental calcium total) by mouth daily with breakfast. ?Patient not taking: Reported on 01/08/2022 01/01/22 01/31/22  Carollee Leitz, MD  ?Continuous Blood Gluc Sensor (FREESTYLE LIBRE 2 SENSOR) MISC Use as directed ?Patient not taking: Reported on 12/20/2021 10/02/21   [name], MD  ?Dulaglutide (TRULICITY) 1.06 YI/9.4WN SOPN Inject 0.75 mg into the skin once a week. ?Patient taking differently: Inject 0.75 mg into the skin every Monday. 11/29/21   Leavy Cella, RPH-CPP  ?DULoxetine (CYMBALTA) 30 MG capsule Take 1 capsule (30 mg total) by mouth daily. 01/21/22   [name], MD  ?Ensure Max  Protein (ENSURE MAX PROTEIN) LIQD Take 330 mLs (11 oz total) by mouth 3 (three) times daily between meals. ?Patient taking differently: Take 11 oz by mouth 2 (two) times daily. 08/10/21   Holley Bouche, MD  ?ferrous sulfate 325 (65 FE) MG tablet Take 1 tablet (325 mg total) by mouth every other day. ?Patient taking differently: Take 325 mg by mouth daily with breakfast. 01/02/22   Carollee Leitz, MD  ?finasteride (PROSCAR) 5 MG tablet Take 1 tablet (5 mg total) by mouth daily. 01/17/22   Holley Bouche, MD  ?folic acid (FOLVITE) 1 MG tablet Take 1 tablet by mouth once daily ?Patient taking differently: Take 1 mg by mouth at bedtime. 10/02/21   [name], MD  ?gabapentin (NEURONTIN) 300 MG capsule Take 2 capsules (600 mg total) by mouth 3 (three) times daily. 12/31/21 01/30/22  Carollee Leitz, MD  ?hydrocerin (EUCERIN) CREA Apply 1 application. topically daily. ?Patient taking differently: Apply 1 application. topically daily as needed (dry skin). 01/01/22   Carollee Leitz, MD  ?levETIRAcetam (KEPPRA) 1000 MG tablet Take 1 tablet (1,000 mg total) by mouth 2 (two) times daily. 09/17/21   Samuella Cota, MD  ?metFORMIN (GLUCOPHAGE-XR) 500 MG 24 hr tablet TAKE 1 TABLET BY MOUTH TWICE DAILY WITH A MEAL ?Patient taking differently: Take 500 mg by mouth in the morning and at bedtime. 12/04/21   [name], MD  ?mirtazapine (REMERON) 30 MG tablet Take 1 tablet (30 mg total) by mouth at bedtime. 01/21/22   [name], MD  ?  Multiple Vitamin (MULTIVITAMIN WITH MINERALS) TABS tablet Take 1 tablet by mouth daily. ?Patient taking differently: Take 1 tablet by mouth at bedtime. 08/11/21   Holley Bouche, MD  ?pantoprazole (PROTONIX) 40 MG tablet Take 1 tablet (40 mg total) by mouth 2 (two) times daily. ?Patient taking differently: Take 40 mg by mouth at bedtime. 09/17/21 01/08/22  Samuella Cota, MD  ?thiamine (VITAMIN B-1) 100 MG tablet Take 1 tablet (100 mg total) by mouth daily. ?Patient taking differently: Take  100 mg by mouth at bedtime. 05/27/16   [name], DO  ?Vitamin D3 (VITAMIN D) 25 MCG tablet Take 1 tablet (1,000 Units total) by mouth daily. ?Patient taking differently: Take 1,000 Units by mouth at bedtime. 01/01/22   Carollee Leitz, MD  ?zinc sulfate 220 (50 Zn) MG capsule Take 1 capsule (220 mg total) by mouth daily. 01/17/22   Holley Bouche, MD  ?   ? ?Allergies    ?Atorvastatin, Drug ingredient [spironolactone], Lisinopril, and Losartan   ? ?Review of Systems   ?Review of Systems  ?All other systems reviewed and are negative. ? ?Physical Exam ?Updated Vital Signs ?BP 121/87   Pulse 89   Temp (!) 97.2 ?F (36.2 ?C) (Oral)   Resp 18   Ht $R'5\' 10"'Xc$  (1.778 m)   Wt 110 kg   SpO2 100%   BMI 34.80 kg/m?  ?Physical Exam ?Vitals and nursing note reviewed.  ?Constitutional:   ?   General: He is not in acute distress. ?   Appearance: He is well-developed. He is not diaphoretic.  ?HENT:  ?   Head: Normocephalic and atraumatic.  ?Cardiovascular:  ?   Rate and Rhythm: Normal rate and regular rhythm.  ?   Heart sounds: No murmur heard. ?  No friction rub.  ?Pulmonary:  ?   Effort: Pulmonary effort is normal. No respiratory distress.  ?   Breath sounds: Normal breath sounds. No wheezing or rales.  ?Abdominal:  ?   General: Bowel sounds are normal. There is no distension.  ?   Palpations: Abdomen is soft.  ?   Tenderness: There is no abdominal tenderness.  ?Musculoskeletal:     ?   General: Normal range of motion.  ?   Cervical back: Normal range of motion and neck supple.  ?Skin: ?   General: Skin is warm and dry.  ?Neurological:  ?   Mental Status: He is alert and oriented to person, place, and time.  ?   Coordination: Coordination normal.  ? ? ?ED Results / Procedures / Treatments   ?Labs ?(all labs ordered are listed, but only abnormal results are displayed) ?Labs Reviewed  ?POC OCCULT BLOOD, ED - Abnormal; Notable for the following components:  ?    Result Value  ? Fecal Occult Bld POSITIVE (*)   ? All other  components within normal limits  ?COMPREHENSIVE METABOLIC PANEL  ?CBC WITH DIFFERENTIAL/PLATELET  ?PROTIME-INR  ?TYPE AND SCREEN  ? ? ?EKG ?None ? ?Radiology ?No results found. ? ?Procedures ?Procedures  ? ? ?Medications Ordered in ED ?Medications - No data to display ? ?ED Course/ Medical Decision Making/ A&P ? ?This patient presents to the ED for concern of weakness and dark stools, this involves an extensive number of treatment options, and is a complaint that carries with it a high risk of complications and morbidity.  The differential diagnosis includes GI bleed with symptomatic anemia ? ? ?Co morbidities that complicate the patient evaluation ? ?None ? ? ?Additional history obtained: ? ?  No additional history or external records needed ? ? ?Lab Tests: ? ?I Ordered, and personally interpreted labs.  The pertinent results include: Worsening renal function with creatinine of 3.6.  Hemoglobin is 8.1 down from 9.510 days ago.  He is also Hemoccult positive. ? ? ?Imaging Studies ordered: ? ?I ordered imaging studies including chest x-ray ?I independently visualized and interpreted imaging which showed left basilar atelectasis versus infiltrate ?I agree with the radiologist interpretation ? ? ?Cardiac Monitoring: / EKG: ? ?The patient was maintained on a cardiac monitor.  I personally viewed and interpreted the cardiac monitored which showed an underlying rhythm of: Sinus ? ? ?Consultations Obtained: ? ?I requested consultation with the family practice,  and discussed lab and imaging findings as well as pertinent plan - they recommend: Admission for monitoring of hemoglobin ? ? ?Problem List / ED Course / Critical interventions / Medication management ? ?Patient presenting with weakness and dark stools.  He has history of gastritis.  His hemoglobin today is 8.1, down from 9.510 days ago.  He is Hemoccult positive with dark stools on digital rectal exam.  Patient also found to have worsening renal function with  creatinine of 3.6.  I feel as though patient will require admission for monitoring of his hemoglobin, hydration, and monitoring of renal function. ?I ordered medication including IV fluids for hydration ?Reevaluation of the

## 2022-02-02 NOTE — Progress Notes (Signed)
Initial Nutrition Assessment ? ?DOCUMENTATION CODES:  ? ?Not applicable ? ?INTERVENTION:  ? ?Boost Breeze po TID, each supplement provides 250 kcal and 9 grams of protein. ? ?MVI with minerals daily. ? ?NUTRITION DIAGNOSIS:  ? ?Increased nutrient needs related to wound healing, chronic illness (alcoholic cirrhosis) as evidenced by estimated needs. ? ?GOAL:  ? ?Patient will meet greater than or equal to 90% of their needs ? ?MONITOR:  ? ?PO intake, Supplement acceptance, Labs, Skin ? ?REASON FOR ASSESSMENT:  ? ?Consult ?Assessment of nutrition requirement/status, Poor PO ? ?ASSESSMENT:  ? ?55 yo male admitted with GI bleed, decompensated alcoholic cirrhosis with ascites. PMH includes alcoholic cirrhosis, heavy ETOH use, erosive esophagitis, DM-2, cardiac arrest with anoxic brain injury, seizure disorder, HLD, CHF. ? ?RD working remotely. ?Unable to reach patient by phone or complete NFPE.  ? ?Labs reviewed. Na 129, Albumin <1.5 ?CBG: [phone] ? ?Albumin low d/t dilutional effect with edema/volume overload.  ? ?Medications reviewed and include Novolog, Keppra, Remeron, thiamine. ? ?Weight history reviewed.  ?Weight fluctuates with fluid status.  ?78.9 kg on 01/14/22 ?110 kg this admission ? ?Per RN assessment, patient has mild pitting edema to BUE and moderate pitting edema to BLE.  ? ?Currently on clear liquids. ?Will be NPO after midnight tonight for EGD tomorrow.  ? ?NUTRITION - FOCUSED PHYSICAL EXAM: ? ?Unable to complete ? ?Diet Order:   ?Diet Order   ? ?       ?  Diet NPO time specified  Diet effective midnight       ?  ?  Diet clear liquid Room service appropriate? Yes; Fluid consistency: Thin  Diet effective now       ?  ? ?  ?  ? ?  ? ? ?EDUCATION NEEDS:  ? ?Not appropriate for education at this time ? ?Skin:  Skin Assessment: Skin Integrity Issues: ?Skin Integrity Issues:: Diabetic Ulcer ?Diabetic Ulcer: great & second toes on both feet ? ?Last BM:  no BM documented ? ?Height:  ? ?Ht Readings from Last 1  Encounters:  ?02/02/22 '5\' 10"'$  (1.778 m)  ? ? ?Weight:  ? ?Wt Readings from Last 1 Encounters:  ?02/02/22 110 kg  ? ? ?Ideal Body Weight:  75.5 kg ? ?BMI:  Body mass index is 34.8 kg/m?. ? ?Estimated Nutritional Needs:  ? ?Kcal:  2200-2400 ? ?Protein:  100-120 gm ? ?Fluid:  2 L ? ? ? ?Lucas Mallow RD, LDN, CNSC ?Please refer to Amion for contact information.                                                       ? ?

## 2022-02-02 NOTE — Progress Notes (Addendum)
Documented temperature of 93.44F, d/w RN who confirmed this was a rectal temp and that repeat was 94.57F. Awaiting full set of vitals. ?Patient with two possible sources of infection: UTI vs SBP. Urine studies ordered earlier in the day but not yet collected. Per RN, pt has not voided in a while. Had paracentesis earlier in the day, studies pending. Reassuringly total nucleated cell count 74. Pt already started on Rocephin for SBP ppx. Prior urine culture grew Klebsiella pneumoniae that was ceftriaxone sensitive ?- Continue CTX ?- Will collect blood cultures x2 ?- Bladder scan, if retaining will I/O ?- F/u UA, Urine culture ?- F/u body fluid studies ? ?ADDENDUM ?On chart review, it appears patient had a rectal temp of 93.5 on admission last time he presented to the hospital. This was attributed to seizure activity that brought him into the hospital at that time. No seizure activity today to explain present finding. Consider that he may have some autonomic dysregulation secondary to his anoxic brain injury, which may also contribute to his labile blood pressures.  ? ?Pearla Dubonnet, MD ? ?

## 2022-02-02 NOTE — H&P (Addendum)
Family Medicine Teaching Service ?Hospital Admission History and Physical ?Service Pager: [phone] ? ?Patient name: Jeffrey [name] Medical record number: [id number] ?Date of birth: 05-02-1966 Age: 56 y.o. Gender: male ? ?Primary Care Provider: [name], MD ?Consultants: GI ?Code Status: Full ?Preferred Emergency Contact: Terrell,Charles (Friend)  ?[phone] (Mobile) ? ?Chief Complaint: Weakness and black stools ? ?Assessment and Plan: ?[name] is a 56 y.o. male presenting with generalized weakness, black stools, and 8/10 abdominal pain. PMH is significant for alcoholic cirrhosis, history of heavy etOH use, hx of erosive esophagitis, T2DM, C-difficile infection, cardiac arrest with anoxic brain injury, seizure disorder, depression, HLD.  ? ?Generalized Weakness  Black Stools  Hx of Erosive Esophagitis ?Recent Weight Loss ?Patient with hx of erosive esophagitis now with new black stools and abdominal pain. Hemodynamically stable and no evidence of brisk blood loss. FOBT positive. Hgb 9.5>8.1 over past 10 days. Weight is down to 110kg today, from 118.8-123.8kg last admission. Patient also with new 8/10 abdominal pain and abdominal distention on exam. No recent NSAID use or etOH abuse (denies any etOH since prior to last hospitalization). Curiously, did have a normal EGD just over one month ago. Also note that he takes iron supplements at home, but he was on these prior to the onset of symptoms. Reports that he has been taking his home BID protonix.  ?Differential for his weakness is broad and includes GI bleed, infection, or deconditioning in the setting of multiple chronic conditions with multiple recent hospitalizations. Was recommended for SNF after his last hospitalization but elected to go home instead. Should also consider possible contributors to his worsening edema: cardiac, hepatic, or renal causes all remain on the differential but would not explain the black stools.  ?- Admit to  Med-Tele, Dr. Andria Frames attending  ?- GI consulted, will see ?- Hold home iron supplementation ?- NPO ahead of possible GI intervention ?- Continue home BID protonix '40mg'$  ?- PT/OT consulted for weakness ?- 2 Large bore IVs ?- KUB given abdominal pain and distention ? ?BLE Edema  Alcoholic cirrhosis  Abdominal Distention ?Hypoalbuminemia ?Presented similarly last admission. Responded swiftly to IV lasix at that time. Had intended to obtain paracentesis for new ascites but there was not enough fluid to tap at that time. He does have more abdominal distention today than he did at that time.  ?Consider the likelihood that hypoalbuminemia is contributing. Albumin is <1.5 today. Patient is chronically malnourished and reports not eating well for >2 months which could explain weight loss.  ?Had an echo earlier this month which showed LVEF 60-65%, no wallm otion abnormalities, mild concentric LVH. Cardiac etiology seems less liekly.  ?- RUQ [name] ?- Will obtain IR paracentesis if evidence of new ascites ?- Hesitate to give Lasix right now with marked AKI- patient is stable on room air  ?- Consult to RD for supplementation recs ?- Urine protein-creatinine ratio to rule out nephrosis ?  ?Hx of Klebsiella UTI ?Found to have persistent Klebsiella UTI at ED visit on 4/19. Received Rocephin x1 and was sent home with Keflex. Unable to say for sure whether he has been taking this. Certainly infection could contribute to his generalized weakness/malaise.  Patient remains afebrile and without leukocytosis. ?- UA, Urine culture - will treat if persistent infection ? ?Secondary adrenal insufficiency ?There was concern for secondary adrenal insufficiency with abnormal ACTH stim test. Aldosterone/PRA ratio was normal. Per Dr. Buddy Duty, endocrinology, treatment of choice in this case would be hydrocortisone. His labs had not resulted by  the time of discharge and therapy was not initiated. Suspect that treatment may help with his weakness/malaise.  BSA 2.50msquared.  ?- Hydrocortisone '15mg'$  am, '10mg'$  pm ?- Will need outpatient endocrinology follow-up  ? ?AKI on CKD  ?Cr 3.59 today, up from 1.56 10 days ago. Likely pre-renal in the setting of several weeks poor PO intake and third-spacing in the setting of hypoalbuminemia as above. S/p 1L bolus in the ED. ?- Continue to monitor ?- POAL if no GI intervention ?- RD consult as above ?- Urine protein-creatinine ratio as above to rule out nephrosis ? ?Diabetes Mellitus Type 2 ?Glucose 541 on admission. CO2 15. Anion Gap 12. Given 10u novolog and 1L NS in ED. Patient has a history of brittle diabetes with hypoglycemic episodes last admission. Home regimen is Trulicity 0.'75mg'$  weekly and metformin '500mg'$  BID.  ?- CBGs, vsSSI ? ?Hypertension  Orthostasis ?Patient was taken off anti-hypertensives last admission due to orthostasis with plans to allow him to run higher pressures while sitting/lying. BP has been within acceptable limits this am. 110s-140s/70s-90s. ?- Continue to monitor ?- No antihypertensives for now  ? ?HLD ?Not on statin due to prior intolerance of atorvastatin. However, given cardiac history and diabetes, would benefit from therapy. ?- Will start with Crestor '5mg'$  daily ? ?Seizure disorder, well-controlled ?No seizures since last hospitalization. Seizures at that time occurred in the setting of heavy etOH use.  ?- Continue home Keppra ? ?Hx PEA arrest with anoxic brain injury ?Prolonged QT ?QTc 541 on am EKG. ?- Repeat EKG in am ?- Avoid QT prolonging agents ?- Cardiac monitoring ? ?Depression ?- Continue home mirtazapine ?   ?Hx etOH abuse ?- CIWAs ?- Continue home Thiamine  ? ? ?FEN/GI: NPO in case of GI procedure ?Prophylaxis: SCDs ? ?Disposition: Med-Tele ? ?History of Present Illness:  AKEYLEN ECKENRODEis a 56y.o. male presenting with 5 days of black stool in the setting of several weeks of generalized fatigue, poor PO intake, and weight loss. ? ?Mr. SMayereports that after his discharge from  recent hospitalization for seizure activity he has still not felt like himself.  He has been persistently weak and has not been able to eat like usual.  He is lost a fair bit of weight in that time..[name] He also noticed about 5 days ago the acute onset of 8/10 abdominal pain worse in his epigastrium and suprapubic regions and to the onset of black stool.  He is on iron supplements at home but reports that this color change is new and starkly different from previous.  He denies any recent falls.  He denies any recent NSAID use or alcohol use.  He has not had a drink of alcohol since a single pain drinking episode that led up to his last hospitalization. ?He was seen in the ED 10 days ago and diagnosed with a Klebsiella UTI but is unable to state whether he has been taking his antibiotics or not.  He denies any fever, chills. ? ?Review Of Systems: Per HPI with the following additions:  ? ?Review of Systems  ?Constitutional:  Positive for activity change, appetite change and fatigue. Negative for fever.  ?Gastrointestinal:  Positive for abdominal distention, abdominal pain and blood in stool. Negative for nausea and vomiting.  ?Genitourinary:  Negative for dysuria and urgency.  ?All other systems reviewed and are negative.  ? ?Patient Active Problem List  ? Diagnosis Date Noted  ? UTI due to Klebsiella species 01/11/2022  ? Alcohol  use   ? Orthostatic hypotension 01/08/2022  ? Iron deficiency anemia due to chronic blood loss   ? Chronic anemia   ? Pre-syncope 12/20/2021  ? Hypocalcemia 12/06/2021  ? Demand ischemia ([place]) 09/16/2021  ? Hypothermia 09/13/2021  ? Hypoalbuminemia   ? Recurrent falls 07/29/2021  ? Grief counseling 01/04/2021  ? Incontinence 12/23/2020  ? Hospital discharge follow-up 11/24/2020  ? Syncope 11/17/2020  ? Hypomagnesemia 11/05/2020  ? Hematemesis with nausea   ? Alcohol abuse with intoxication ([place])   ? Duodenal ulcer hemorrhagic   ? GI bleed 10/31/2020  ? Hyperglycemia 10/31/2020  ? DKA (diabetic  ketoacidosis) ([place]) 07/22/2020  ? Candidiasis 07/22/2020  ? Hyponatremia 06/28/2020  ? Eyebrow laceration, left, initial encounter   ? Hypokalemia   ? Hyperlipidemia associated with type 2 diabetes mellitus ([place])

## 2022-02-02 NOTE — Hospital Course (Addendum)
Brief Hospital Course ?[name] is a 56yo who presented to the [facility] ED on 4/29 with generalized weakness and black schools, admitted for concern for a GI bleed.  PMH of alcoholic cirrhosis, history of heavy alcohol use, history of erosive esophagitis, type 2 diabetes, C. difficile infection, cardiac arrest with anoxic brain injury, seizure disorder, depression. ? ?GI Bleed Concern with erosive esophagitis ?Patient presented with 5 days of black stool.  Hemoglobin was noted to have dropped from 9.5-8.1 over the 10 days prior to admission.  FOBT obtained in the ED was positive.  Patient also had new 8/10 abdominal pain and abdominal distention.  GI was consulted and made plans for an EGD on 4/30.  EGD demonstrated LA grade D esophagitis, small hiatal hernia, mild portal hypertensive gastropathy, single localized erosion of duodenal bulb, and multiple nonbleeding superficial duodenal ulcers ([place] class IIc). Patient remained hemodynamically stable. Concern for hemoglobin drop, was transfused one unit pRBC. Hemoglobin remained stable.  Patient was maintained on twice daily PPI. He was scheduled for outpatient GI follow-up at discharge. ?  ?Anasarca ?Alcoholic Cirrhosis ?Patient was markedly volume up on admission, with BLE edema, abdominal distention, and periorbital edema.  Right upper quadrant ultrasound revealed mild to moderate ascites.  Paracentesis yielded 1.6L. with  labs revealing <3.0 protein, <1.5 albumin.  Patient was started on ceftriaxone for SBP prophylaxis in the setting of GI bleed as above, which was continued for 7 days per GI recommendation.  Patient's chronic hypoalbuminemia was thought to be contributing to his anasarca.  Albumin on arrival was <1.5.  RD was consulted and recommended supplementation with Boost Breeze. ? ?Secondary Adrenal Insufficiency ?Diagnosis made based on lab work from previous admission that was not followed up as an outpatient.  Patient was started on hydrocortisone 15  milligrams in the morning and 10 mg at night at the time of admission.  ? ?AKI on CKD with ATN ?Cr on admission was 3.59, up from 1.56 prior to hospitalization. FeNa < 0.1%, indicating pre-renal cause of AKI.  Renal ultrasound was obtained and showed no hydronephrosis. Nephrology was consulted and recommended starting IV albumin and flomax, which improved his urinary hesitancy. Urine protein creatinine ratio was obtained to rule out nephrosis and showed elevation to 4.05, elevated to 10.89 a few days later with concern for nephrotic syndrome. He had urine microscopy which show granular casts, and his elevated Cr and other findings were believed he had acute tubular necrosis. He was discharged on Torsemide '20mg'$  daily. Cr at discharge was 3.46 with GFR 20. ? ?Type 2 Diabetes Mellitus, brittle ?CBG difficult to control while inpatient with swings to 300-400s and hypoglycemia to 50s-60s. He was discharged on scheduled mealtime insulin: 3u short-acting insulin TID with meals. ? ?Hypertension  ?BP elevated in 180s/100s persistently. Started on Propranolol '20mg'$  daily in light of cirrhosis. ? ?For discharge follow-up with PCP: ?1. Will need endocrinology referral for secondary adrenal insufficiency ?2. Will follow-up with GI for repeat EGD ina few months to ensure mucosal healing. Will also follow-up with nephrology outpatient. ?3. Started on propranolol and amlodipine for hypertension. Follow-up on BP and BMP.  ?

## 2022-02-02 NOTE — Progress Notes (Signed)
Pt arrived to unit from ED without report from prior nurse. Temp unobtainable through oral cavity. Rectal temp 93.9 and 94.1 on repeat, despite a record of 97.7 that was documented at 1526 by ED staff. MD on call notified of rectal temp and gave order to apply bair hugger.  ?

## 2022-02-02 NOTE — Progress Notes (Signed)
Bladder scan revealed 279 mL. Order for UA and Urine cultre not obtained in ED. Pt reports that he did not void in the ED.  Order received to in and out cath pt. 300 mL was removed from bladder. Urine sample sent to lab.  ?

## 2022-02-02 NOTE — ED Triage Notes (Signed)
Patient BIB [facility] from home c/o GI bleeding since yesterday.  EMS reports patient was talking in complete sentences upon their arrival, and during transport, became less responsive.  Patient alert to voice upon arrival. ? ?86/p. ?557 CBG ?100% RA ?25 ETCO2 ?IO in left humeral head ?90 HR ?

## 2022-02-02 NOTE — Consult Note (Addendum)
? ?                                            Consultation Note ? ? ?Referring Provider: Family Medicine teaching service ?PCP: Jeffrey Shave, MD ?Primary Gastroenterologist: Jeffrey [name] ?Reason for consultation: melena  ?Hospital Day: 1 ? ?ASSESSMENT:  ? ?56 yo male with black stool (at time of last BM 5 days ago)  / FOBT+ / acute on chronic anemia.  ?Multiple admissions / endoscopies for the same with history of severe esophagitis, gastritis and duodenitis. However, most recent EGD mid March was normal . He has Etoh cirrhosis without findings of portal hypertension on recent EGD. Could have developed interval upper GI lesion such as recurrence of severe esophagitis or portal hypertension still drinking Etoh.   ? ?Etoh cirrhosis, probably decompensated.  ?MELD 27 ?Still consumes Etoh ?Coagulopathic. Severe hypoalbuminemia, Ascites on [name], AKI on CKD ?Need to rule out SBP though not complaining of abdominal pain and WBC is normal.  ? ?AKI on CKD.  ?Creatinine is also up from 1.5 to 3.59. Home lasix is on hold ? ?Diabetes ?glucose 541 ? ?Secondary adrenal insufficiency.  ? ?See PMH for additional medical problems ? ? ?PLAN:  ? ?Paracentesis already ordered by admitting team. Will make sure cell count is done.  ?Rocephin for SBP prophylaxis ?EGD tomorrow if blood glucose improved.  The risks and benefits of EGD with possible biopsies were discussed with the patient who agrees to proceed.  ?Continue PPI ?May need renal consult if renal function doesn't quickly improve. Home lasix is on hold.  ?2 gram sodium restricted diet ?No evidence for encephalopathy. ?Stop Etoh ?Will review records. If complete hepatic serologic evaluation hasn't been done then will order it. Cirrhosis probably etoh but other etiologies need to be excluded.  ? ? ?Attending Physician Note  ? ?I have taken a history, reviewed the chart and examined the patient. I performed a substantive portion of this encounter, including complete performance of  at least one of the key components, in conjunction with the APP. I agree with the APP's note, impression and recommendations with my edits. My additional impressions and recommendations are as follows.  ? ?*History of melena, heme + stool, worsening anemia. Colonoscopy March 2023 showed only diverticulosis and EGD in March 2023 was normal. R/O ulcer, portal gastropathy, recurrent esophagitis, AVMs and other causes. PPI, trend CBC, transfusion to maintain Hb > 7, Rocephin. EGD tomorrow to further evaluate.  ? ?*Decompensated alcoholic cirrhosis with ascites. MELD=27. Stop alcohol. Diagnostic paracentesis - R/O SBP, check SAAG.  ? ?*AKI on CKD. Consider renal consult, defer to primary service.  ? ?Jeffrey Edward, MD [facility] ?See AMION, [facility], for our on call provider  ? ? ? ?History of Present Illness:  ?Jeffrey [name] is a 56 y.o. male  with multiple medical problems not limited to  Etoh abuse, Etoh cirrhosis, hiatal hernia, GERD / erosive esophagitis, duodenitis, PUD seizure disorder, PEA arrest and anoxic brain injury, DM, HTN, chronic diastolic heart failure, history of C. difficile infection.  See PMH for any additional medical problems. ? ?Jeffrey [name] has had several admissions and upper endoscopies for upper GI bleeding / anemia.  He has a history of severe erosive esophagitis, gastritis and duodenitis but his most recent EGD mid March was normal.  He also had a unremarkable colonoscopy at that time (except for diverticulosis).  During  that admission he received 3 units of PRBCs for hemoglobin in the 7 range.  ? ?Jeffrey [name] was hospitalized earlier this month with weakness, falls and self-reported seizure.  Unfortunately he had been drinking alcohol.  He was also treated for UTI.  During that admission his hemoglobin had stayed remained stable since blood transfusion the prior admission in March. ? ?Interval history ?Patient presented to ED today for evaluation of weakness and black stool.  He has not had a bowel  movement since Monday (5 days ago) but on Monday his bowel movement was black.  He denies abdominal pain or nausea/vomiting.  He says that he is compliant with pantoprazole.  He does not take NSAIDs.  He was apparently supposed to start oral iron but has not done so.[name]  No Pepto use.   His hemoglobin in the ED is 8.1, down from 9.510 days ago.  His creatinine is also elevated from 1.5 to 3.59. He does admit to drinking Etoh yesterday . He has anasarca, albumin in < 1.5. His glucose is 541 ? ? ?Previous GI Evaluation / History   ? ?2014 EGD for GI bleed ?--ulcer at GE junction ? ?Jan 2016 EGD for GI bleed ?ENDOSCOPIC IMPRESSION: ?1. LA Class C esophagitis ?2. 5 cm hiatal hernia ?3. Mild duodenitis ? ?Jan 2022 EGD for GI bleed ?Severe reflux esophagitis ?2. Mild gastritis ?3. No varices. No portal hypertensive gastropathy ?4. Duodenal ulceration as described. Changes suggest ischemia of some sort ?5. No active bleeding or high risk stigmata. ? ?March 18th, 2023 EGD and colonoscopy for suspected GI bleed and IDA ?- No gross lesions in esophagus. ?- Normal stomach. ?- Normal examined duodenum. ?- No specimens collected ? ?-  Diverticulosis. ?- The examination was otherwise normal on direct and retroflexion views. ?- No specimens collected ? ? ?Recent Labs and Imaging ?X-ray chest PA and lateral ? ?Result Date: 02/02/2022 ?CLINICAL DATA:  GI bleeding since yesterday. EXAM: CHEST - 2 VIEW COMPARISON:  01/08/2022 FINDINGS: Low volume film. Cardiopericardial silhouette is at upper limits of normal for size. Left base atelectasis or infiltrate. Right lung clear. No substantial pleural effusion. Telemetry leads overlie the chest. IMPRESSION: Low volume film with left base atelectasis or infiltrate. Electronically Signed   By: Misty Stanley M.D.   On: 02/02/2022 09:33  ? ?DG Abd 1 View ? ?Result Date: 02/02/2022 ?CLINICAL DATA:  GI bleeding since yesterday EXAM: ABDOMEN - 1 VIEW COMPARISON:  09/13/2021 FINDINGS: No gaseous bowel  dilatation. Air is seen in the nondilated transverse and descending colon with mild gaseous distention of the sigmoid colon, within normal limits. Pancreatic calcification compatible with chronic pancreatitis. Mild degenerative changes in the hips. IMPRESSION: 1. Nonobstructive bowel gas pattern. 2. Pancreatic calcification compatible with chronic pancreatitis. Electronically Signed   By: Misty Stanley M.D.   On: 02/02/2022 09:35  ? ?CT Head Wo Contrast ? ?Result Date: 01/23/2022 ?CLINICAL DATA:  History of seizures, patient not feeling well. EXAM: CT HEAD WITHOUT CONTRAST TECHNIQUE: Contiguous axial images were obtained from the base of the skull through the vertex without intravenous contrast. RADIATION DOSE REDUCTION: This exam was performed according to the departmental dose-optimization program which includes automated exposure control, adjustment of the mA and/or kV according to patient size and/or use of iterative reconstruction technique. COMPARISON:  January 08, 2022 FINDINGS: Brain: No evidence of acute infarction, hemorrhage, hydrocephalus, extra-axial collection or mass lesion/mass effect. Vascular: No hyperdense vessel or unexpected calcification. Skull: Normal. Negative for fracture or focal lesion. Sinuses/Orbits: There is  moderate severity bilateral maxillary sinus mucosal thickening. Other: None. IMPRESSION: 1. No acute intracranial abnormality. 2. Moderate severity bilateral maxillary sinus mucosal thickening. Electronically Signed   By: Virgina Norfolk M.D.   On: 01/23/2022 23:09  ? ?CT HEAD WO CONTRAST (5MM) ? ?Result Date: 01/08/2022 ?CLINICAL DATA:  Unwitnessed fall. EXAM: CT HEAD WITHOUT CONTRAST CT MAXILLOFACIAL WITHOUT CONTRAST TECHNIQUE: Multidetector CT imaging of the head and maxillofacial structures were performed using the standard protocol without intravenous contrast. Multiplanar CT image reconstructions of the maxillofacial structures were also generated. RADIATION DOSE REDUCTION: This  exam was performed according to the departmental dose-optimization program which includes automated exposure control, adjustment of the mA and/or kV according to patient size and/or use of iterative re

## 2022-02-03 ENCOUNTER — Inpatient Hospital Stay (HOSPITAL_COMMUNITY): Payer: Commercial Managed Care - HMO

## 2022-02-03 ENCOUNTER — Encounter (HOSPITAL_COMMUNITY)
Admission: EM | Disposition: A | Discharge status: Skilled Nursing Facility | Payer: Self-pay | Source: Home / Self Care | Attending: Family Medicine

## 2022-02-03 ENCOUNTER — Inpatient Hospital Stay (HOSPITAL_COMMUNITY): Payer: Commercial Managed Care - HMO | Admitting: Anesthesiology

## 2022-02-03 ENCOUNTER — Encounter (HOSPITAL_COMMUNITY): Payer: Self-pay | Admitting: Student

## 2022-02-03 DIAGNOSIS — K766 Portal hypertension: Secondary | ICD-10-CM

## 2022-02-03 DIAGNOSIS — K269 Duodenal ulcer, unspecified as acute or chronic, without hemorrhage or perforation: Secondary | ICD-10-CM

## 2022-02-03 DIAGNOSIS — E1140 Type 2 diabetes mellitus with diabetic neuropathy, unspecified: Secondary | ICD-10-CM | POA: Diagnosis not present

## 2022-02-03 DIAGNOSIS — K2101 Gastro-esophageal reflux disease with esophagitis, with bleeding: Secondary | ICD-10-CM

## 2022-02-03 DIAGNOSIS — K7031 Alcoholic cirrhosis of liver with ascites: Secondary | ICD-10-CM

## 2022-02-03 DIAGNOSIS — K2100 Gastro-esophageal reflux disease with esophagitis, without bleeding: Secondary | ICD-10-CM

## 2022-02-03 DIAGNOSIS — K449 Diaphragmatic hernia without obstruction or gangrene: Secondary | ICD-10-CM

## 2022-02-03 DIAGNOSIS — N179 Acute kidney failure, unspecified: Secondary | ICD-10-CM | POA: Diagnosis not present

## 2022-02-03 DIAGNOSIS — K3189 Other diseases of stomach and duodenum: Secondary | ICD-10-CM

## 2022-02-03 DIAGNOSIS — D638 Anemia in other chronic diseases classified elsewhere: Secondary | ICD-10-CM | POA: Diagnosis not present

## 2022-02-03 HISTORY — PX: ESOPHAGOGASTRODUODENOSCOPY (EGD) WITH PROPOFOL: SHX5813

## 2022-02-03 HISTORY — PX: BIOPSY: SHX5522

## 2022-02-03 LAB — URINALYSIS, ROUTINE W REFLEX MICROSCOPIC
Bilirubin Urine: NEGATIVE
Glucose, UA: 150 mg/dL — AB
Ketones, ur: NEGATIVE mg/dL
Nitrite: NEGATIVE
Protein, ur: >=300 mg/dL — AB
RBC / HPF: >50 RBC/hpf — ABNORMAL HIGH (ref 0–5)
Specific Gravity, Urine: 1.015 (ref 1.005–1.030)
pH: 5 (ref 5.0–8.0)

## 2022-02-03 LAB — CBC
HCT: 24.9 % — ABNORMAL LOW (ref 39.0–52.0)
Hemoglobin: 8.1 g/dL — ABNORMAL LOW (ref 13.0–17.0)
MCH: 27.1 pg (ref 26.0–34.0)
MCHC: 32.5 g/dL (ref 30.0–36.0)
MCV: 83.3 fL (ref 80.0–100.0)
Platelets: 464 10*3/uL — ABNORMAL HIGH (ref 150–400)
RBC: 2.99 MIL/uL — ABNORMAL LOW (ref 4.22–5.81)
RDW: 22.1 % — ABNORMAL HIGH (ref 11.5–15.5)
WBC: 9.5 10*3/uL (ref 4.0–10.5)
nRBC: 0 % (ref 0.0–0.2)

## 2022-02-03 LAB — GLUCOSE, CAPILLARY
Glucose-Capillary: 200 mg/dL — ABNORMAL HIGH (ref 70–99)
Glucose-Capillary: 186 mg/dL — ABNORMAL HIGH (ref 70–99)
Glucose-Capillary: 219 mg/dL — ABNORMAL HIGH (ref 70–99)
Glucose-Capillary: 258 mg/dL — ABNORMAL HIGH (ref 70–99)

## 2022-02-03 LAB — COMPREHENSIVE METABOLIC PANEL
ALT: 27 U/L (ref 0–44)
AST: 26 U/L (ref 15–41)
Albumin: <1.5 g/dL — ABNORMAL LOW (ref 3.5–5.0)
Alkaline Phosphatase: 162 U/L — ABNORMAL HIGH (ref 38–126)
Anion gap: 8 (ref 5–15)
BUN: 26 mg/dL — ABNORMAL HIGH (ref 6–20)
CO2: 20 mmol/L — ABNORMAL LOW (ref 22–32)
Calcium: 7.2 mg/dL — ABNORMAL LOW (ref 8.9–10.3)
Chloride: 103 mmol/L (ref 98–111)
Creatinine, Ser: 3.57 mg/dL — ABNORMAL HIGH (ref 0.61–1.24)
GFR, Estimated: 19 mL/min — ABNORMAL LOW (ref >60)
Glucose, Bld: 217 mg/dL — ABNORMAL HIGH (ref 70–99)
Potassium: 4.7 mmol/L (ref 3.5–5.1)
Sodium: 131 mmol/L — ABNORMAL LOW (ref 135–145)
Total Bilirubin: 0.5 mg/dL (ref 0.3–1.2)
Total Protein: 5.2 g/dL — ABNORMAL LOW (ref 6.5–8.1)

## 2022-02-03 LAB — PROTEIN / CREATININE RATIO, URINE
Creatinine, Urine: 139.61 mg/dL
Protein Creatinine Ratio: 4.05 mg/mg{Cre} — ABNORMAL HIGH (ref 0.00–0.15)
Total Protein, Urine: 565 mg/dL

## 2022-02-03 LAB — CREATININE, URINE, RANDOM: Creatinine, Urine: 182.89 mg/dL

## 2022-02-03 LAB — SODIUM, URINE, RANDOM: Sodium, Ur: <10 mmol/L

## 2022-02-03 SURGERY — ESOPHAGOGASTRODUODENOSCOPY (EGD) WITH PROPOFOL
Anesthesia: Monitor Anesthesia Care

## 2022-02-03 MED ORDER — PHENYLEPHRINE 80 MCG/ML (10ML) SYRINGE FOR IV PUSH (FOR BLOOD PRESSURE SUPPORT)
PREFILLED_SYRINGE | INTRAVENOUS | Status: DC | PRN
Start: 2022-02-03 — End: 2022-02-03
  Administered 2022-02-03: 80 ug via INTRAVENOUS
  Administered 2022-02-03: 160 ug via INTRAVENOUS

## 2022-02-03 MED ORDER — LACTATED RINGERS IV SOLN: INTRAVENOUS | Status: DC | PRN

## 2022-02-03 MED ORDER — LEVETIRACETAM 500 MG PO TABS
500.0000 mg | ORAL_TABLET | Freq: Two times a day (BID) | ORAL | Status: DC
  Administered 2022-02-03 – 2022-02-11 (×17): 500 mg via ORAL
  Filled 2022-02-03 (×17): qty 1

## 2022-02-03 MED ORDER — PROPOFOL 500 MG/50ML IV EMUL
INTRAVENOUS | Status: DC | PRN
  Administered 2022-02-03: 125 ug/kg/min via INTRAVENOUS

## 2022-02-03 MED ORDER — PROPOFOL 10 MG/ML IV BOLUS
INTRAVENOUS | Status: DC | PRN
  Administered 2022-02-03 (×2): 30 mg via INTRAVENOUS

## 2022-02-03 MED ORDER — SODIUM CHLORIDE 0.9 % IV SOLN: INTRAVENOUS | Status: DC

## 2022-02-03 SURGICAL SUPPLY — 15 items

## 2022-02-03 NOTE — Progress Notes (Signed)
PT Cancellation Note ? ?Patient Details ?Name: Jeffrey [name] ?MRN: [id number] ?DOB: 04-05-1966 ? ? ?Cancelled Treatment:    Reason Eval/Treat Not Completed: Patient at procedure or test/unavailable. Will plan to follow-up later as time permits. ? ? ?Moishe Spice, PT, DPT ?Acute Rehabilitation Services  ?Pager: [phone] ?Office: [phone] ? ? ? ?Jeffrey [name] ?02/03/2022, 8:46 AM ? ? ?

## 2022-02-03 NOTE — Progress Notes (Signed)
MDA made aware patients CBG 186 no orders at this time will continue to monitor.   ?

## 2022-02-03 NOTE — Progress Notes (Signed)
FPTS Brief Progress Note ? ?S: Lying in bed sleeping.  ? ? ?O: ?BP (!) 166/100 (BP Location: Left Arm)   Pulse 96   Temp 98.9 ?F (37.2 ?C) (Rectal)   Resp 18   Ht '5\' 10"'$  (1.778 m)   Wt 110 kg   SpO2 100%   BMI 34.80 kg/m?   ?General: no acute distress.  ?Respiratory: normal effort ? ? ?A/P: ?Generalized Weakness  Black Stools  Hx of Erosive Esophagitis ?Recent Weight Loss ?- Recheck BP; nursing notified ?- Orders reviewed. Labs for AM ordered, which was adjusted as needed.  ? ?Gerlene Fee, DO ?02/03/2022, 1:44 AM ?PGY-3, [place] [facility] Night Resident  ?Please page [phone] with questions.  ? ? ?

## 2022-02-03 NOTE — Anesthesia Preprocedure Evaluation (Signed)
Anesthesia Evaluation  ?Patient identified by MRN, date of birth, ID band ?Patient awake ? ? ? ?Reviewed: ?Allergy & Precautions, H&P , NPO status , Patient's Chart, lab work & pertinent test results ? ?Airway ?Mallampati: III ? ?TM Distance: >3 FB ?Neck ROM: Full ? ? ? Dental ? ?(+) Dental Advisory Given, Edentulous Upper ?  ?Pulmonary ?sleep apnea ,  ?  ?Pulmonary exam normal ?breath sounds clear to auscultation ? ? ? ? ? ? Cardiovascular ?hypertension, Pt. on medications ?Normal cardiovascular exam ?Rhythm:Regular Rate:Normal ? ? ?  ?Neuro/Psych ?Seizures -,  PSYCHIATRIC DISORDERS Anxiety Depression   ? GI/Hepatic ?PUD, GERD  Medicated,  ?Endo/Other  ?diabetes, Type 2, Insulin Dependent, Oral Hypoglycemic AgentsObesity ? ? Renal/GU ?Renal InsufficiencyRenal disease  ? ?  ?Musculoskeletal ? ? Abdominal ?  ?Peds ? Hematology ? ?(+) Blood dyscrasia, anemia ,   ?Anesthesia Other Findings ? ? Reproductive/Obstetrics ? ?  ? ? ? ? ? ? ? ? ? ? ? ? ? ?  ?  ? ? ? ? ? ? ? ? ?Anesthesia Physical ? ?Anesthesia Plan ? ?ASA: 3 ? ?Anesthesia Plan: MAC  ? ?Post-op Pain Management: Minimal or no pain anticipated  ? ?Induction: Intravenous ? ?PONV Risk Score and Plan: 1 and Treatment may vary due to age or medical condition and Propofol infusion ? ?Airway Management Planned: Natural Airway and Nasal Cannula ? ?Additional Equipment:  ? ?Intra-op Plan:  ? ?Post-operative Plan:  ? ?Informed Consent: I have reviewed the patients History and Physical, chart, labs and discussed the procedure including the risks, benefits and alternatives for the proposed anesthesia with the patient or authorized representative who has indicated his/her understanding and acceptance.  ? ? ? ?Dental advisory given ? ?Plan Discussed with: CRNA, Anesthesiologist and Surgeon ? ?Anesthesia Plan Comments:   ? ? ? ? ? ? ?Anesthesia Quick Evaluation ? ?

## 2022-02-03 NOTE — Progress Notes (Signed)
Family Medicine Teaching Service ?Daily Progress Note ?Intern Pager: [phone] ? ?Patient name: Jeffrey [name] Medical record number: [id number] ?Date of birth: Dec 30, 1965 Age: 56 y.o. Gender: male ? ?Primary Care Provider: [name], MD ?Consultants: GI ?Code Status: Full status ? ?Pt Overview and Major Events to Date:  ?Admitted 4/29 ?EGD 4/30 ? ?Assessment and Plan: ?Mr. Feldhaus is a 56 year old male admitted for melena, abdominal distention, and AKI; concern for GI bleed, and worsening alcoholic cirrhosis. PMH is significant for alcoholic cirrhosis, history of heavy etOH use, hx of erosive esophagitis, T2DM, C-difficile infection, cardiac arrest with anoxic brain injury, seizure disorder, depression, HLD.  ?  ?Generalized Weakness  Black Stools  Hx of Erosive Esophagitis ?Recent Weight Loss ?EGD today demonstrated LA grade D esophagitis, small hiatal hernia, mild portal hypertensive gastropathy, single localized erosion of duodenal bulb, and multiple nonbleeding superficial duodenal ulcers (Forrest class IIc).  ?- Daily CBCs ?- Full liquid diet ?- Protonix 40 mg twice daily ?- Avoid NSAIDs ?- PT OT eval and treat ?  ?BLE Edema  Alcoholic cirrhosis  Abdominal Distention ?Hypoalbuminemia ?RUQ US demonstrates diffuse fatty infiltration and cirrhosis without focal liver lesions. US guided paracentesis yielded 1.6 L of clear colorless fluid, sample sent to lab.  Unchanged hypoalbuminemia, likely contributing.  MELD score 22 points (19.6% 90-day mortality), MELD-Na score 27 points (27-32% 90-day mortality). ?- Awaiting RD recommendations for nutritional support ?- Awaiting paracentesis labs ?- Continue SBP precautions with Rocephin 1 g every 24 ?  ?Hx of Klebsiella UTI ?Found to have persistent Klebsiella UTI at ED visit on 4/19. Received Rocephin x1 and was sent home with Keflex. Unable to say for sure whether he has been taking this.  UA and urine culture ordered, marked as collected yesterday 5:20 PM  however main lab never received them.  Patient has already been started on Rocephin 1 g every 24 for SBP prophylaxis.  We will not pursue further urine culture at this time. ?  ?Secondary adrenal insufficiency ?Continue hydrocortisone 15 mg a.m. and 10 mg p.m.  Will need outpatient endocrinology follow-up. ? ?AKI on CKD  ?Creatinine remains elevated but is stable from yesterday.  Urine protein creatinine ratio 4.05 yesterday, increased from 1.61 3 weeks ago. ?- Daily creatinine checks ?- Renal ultrasound today ?- Order urine sodium and creatinine to calculate FeNa ? ?Uncontrolled brittle T2DM ?Glucose improved with very sensitive sliding scale insulin.  Continue CBG checks and vsSSI.  ?- Consider very low-dose Lantus once sugars stabilized ?  ?Hypertension  Orthostasis ?No antihypertensives at home.  Last 24 hours intermittently hypertensive but improving this morning.  We will continue to monitor. ?  ?HLD ?Started Crestor 5 mg daily yesterday. ?  ?Seizure disorder, well-controlled ?None/hospitalization, previously associated with heavy EtOH use.  Continue home Keppra 1000 mg twice daily. ?  ?Hx PEA arrest with anoxic brain injury  Prolonged QT ?QTc 474 this morning, improved from admission.  Continue intermittent EKG check ins.  Avoid QT prolonging agents. ? ?Depression ?Continue home mirtazapine 30 mg nightly. ?   ?Hx etOH abuse ?Continue on thiamine 100 mg nightly.  CIWA's 0 thus far since admission.  Continue CIWA's every 8 hours x72 hours. ? ?FEN/GI: Full liquid diet ?PPx: Pantoprazole ?Dispo: Pending continued work-up ? ?Subjective:  ?Doing well, no concerns. Reports symptomatic improvement after paracentesis. No pain at this time, breathing well. Upset he left his phone and glasses at home.  ? ?Objective: ?Temp:  [93.9 ?F (34.4 ?C)-98.9 ?F (37.2 ?C)] 97.6 ?F (36.4 ?C) (  04/30 1148) ?Pulse Rate:  [85-107] 100 (04/30 1148) ?Resp:  [16-32] 16 (04/30 1148) ?BP: (101-166)/(58-106) 117/77 (04/30 1148) ?SpO2:  [97  %-100 %] 100 % (04/30 1148) ?Weight:  [119.6 kg] 119.6 kg (04/30 0314) ?Physical Exam: ?General: awake, alert, no acute distress ?Cardiovascular: RRR, no murmur ?Respiratory: CTAB ?Abdomen: soft, nontender ?Extremities: woody vascular skin changes of anterior shin bilaterally ? ?Laboratory: ?Recent Labs  ?Lab 02/02/22 ?2778 02/03/22 ?0421  ?WBC 9.4 9.5  ?HGB 8.1* 8.1*  ?HCT 24.9* 24.9*  ?PLT 378 464*  ? ?Recent Labs  ?Lab 02/02/22 ?2423 02/03/22 ?0421  ?NA 129* 131*  ?K 4.4 4.7  ?CL 102 103  ?CO2 15* 20*  ?BUN 28* 26*  ?CREATININE 3.59* 3.57*  ?CALCIUM 6.9* 7.2*  ?PROT 4.8* 5.2*  ?BILITOT 0.1* 0.5  ?ALKPHOS 165* 162*  ?ALT 24 27  ?AST 26 26  ?GLUCOSE 541* 217*  ? ?Imaging/Diagnostic Tests: ?ABDOMEN - 1 VIEW 02/03/22  ?IMPRESSION: ?1. Nonobstructive bowel gas pattern. ?2. Pancreatic calcification compatible with chronic pancreatitis. ? ?CHEST - 2 VIEW 02/03/22 ?IMPRESSION: ?Low volume film with left base atelectasis or infiltrate. ? ?RUQ [name] 02/03/22 ?IMPRESSION: ?1. Appearance of the liver compatible with diffuse fatty ?infiltration and suspected underlying cirrhosis. No focal liver ?lesion is identified. ?2. Small-moderate volume ascites throughout the abdomen. ? ?ULTRASOUND GUIDED PARACENTESIS 02/03/22 ?IMPRESSION: ?Successful ultrasound-guided paracentesis yielding 1.6 liters of ?peritoneal fluid. ? ? ?Ezequiel Essex, MD ?02/03/2022, 3:02 PM ?PGY-2, [place] ?[place] Intern pager: [phone], text pages welcome  ?

## 2022-02-03 NOTE — Op Note (Addendum)
[facility] ?Patient Name: Jeffrey [name] ?Procedure Date : 02/03/2022 ?MRN: [id number] ?Attending MD: Ladene Artist , MD ?Date of Birth: [birth year] ?CSN: [id number] ?Age: 56 ?Admit Type: Inpatient ?Procedure:                Upper GI endoscopy ?Indications:              Acute post hemorrhagic anemia, Melena ?Providers:                Pricilla Riffle. Fuller Plan, MD, Grace Isaac, RN, [place]  ?                          [place], [employer] ?Referring MD:             Jamal Collin. Hensel, MD ?Medicines:                Monitored Anesthesia Care ?Complications:            No immediate complications. ?Estimated Blood Loss:     Estimated blood loss was minimal. ?Procedure:                Pre-Anesthesia Assessment: ?                          - Prior to the procedure, a History and Physical  ?                          was performed, and patient medications and  ?                          allergies were reviewed. The patient's tolerance of  ?                          previous anesthesia was also reviewed. The risks  ?                          and benefits of the procedure and the sedation  ?                          options and risks were discussed with the patient.  ?                          All questions were answered, and informed consent  ?                          was obtained. Prior Anticoagulants: The patient has  ?                          taken no previous anticoagulant or antiplatelet  ?                          agents. ASA Grade Assessment: III - A patient with  ?                          severe systemic disease. After reviewing the risks  ?  and benefits, the patient was deemed in  ?                          satisfactory condition to undergo the procedure. ?                          After obtaining informed consent, the endoscope was  ?                          passed under direct vision. Throughout the  ?                          procedure, the patient's blood pressure, pulse, and  ?                           oxygen saturations were monitored continuously. The  ?                          [id number]-[id number] ([id number]) Olympus endoscope was introduced  ?                          through the mouth, and advanced to the third part  ?                          of duodenum. The upper GI endoscopy was  ?                          accomplished without difficulty. The patient  ?                          tolerated the procedure well. ?Scope In: ?Scope Out: ?Findings: ?     LA Grade D (one or more mucosal breaks involving at least 75% of  ?     esophageal circumference) esophagitis with no active bleeding was found  ?     in the mid and distal esophagus. Biopsies were taken with a cold forceps  ?     for histology. ?     The exam of the esophagus was otherwise normal. ?     A small hiatal hernia was present. ?     Mild portal hypertensive gastropathy was found in the gastric fundus and  ?     in the gastric body. Biopsies were taken with a cold forceps for  ?     histology. ?     The exam of the stomach was otherwise normal. ?     A single localized erosion without bleeding was found in the duodenal  ?     bulb. ?     Multiple non-bleeding superficial duodenal ulcers with a flat pigmented  ?     spot (Forrest Class IIc) were found in the second portion of the  ?     duodenum and in the third portion of the duodenum. The largest lesion  ?     was 7 mm in largest dimension. ?     The exam of the duodenum was otherwise normal. No blood in UGI tract. ?Impression:               -  LA Grade D reflux esophagitis with no active  ?                          bleeding. Biopsied. ?                          - Small hiatal hernia. ?                          - Portal hypertensive gastropathy. Biopsied. ?                          - Duodenal erosion without bleeding. ?                          - Multiple non-bleeding duodenal ulcers with a flat  ?                          pigmented spot (Forrest Class IIc). ?Recommendation:           - Return  patient to hospital ward for ongoing care. ?                          - Full liquid diet today. ?                          - Follow antireflux measures long term. ?                          - Continue present medications including  ?                          pantoprazole 40 mg bid long term. ?                          - No aspirin, ibuprofen, naproxen, or other  ?                          non-steroidal anti-inflammatory drugs. ?                          - Await pathology results. ?Procedure Code(s):        --- Professional --- ?                          [phone], Esophagogastroduodenoscopy, flexible,  ?                          transoral; with biopsy, single or multiple ?Diagnosis Code(s):        --- Professional --- ?                          K21.00, Gastro-esophageal reflux disease with  ?                          esophagitis, without bleeding ?  K44.9, Diaphragmatic hernia without obstruction or  ?                          gangrene ?                          K76.6, Portal hypertension ?                          K31.89, Other diseases of stomach and duodenum ?                          K26.9, Duodenal ulcer, unspecified as acute or  ?                          chronic, without hemorrhage or perforation ?                          D62, Acute posthemorrhagic anemia ?                          K92.1, Melena (includes Hematochezia) ?CPT copyright 2019 American Medical Association. All rights reserved. ?The codes documented in this report are preliminary and upon coder review may  ?be revised to meet current compliance requirements. ?Ladene Artist, MD ?02/03/2022 8:36:41 AM ?This report has been signed electronically. ?Number of Addenda: 0 ?

## 2022-02-03 NOTE — Progress Notes (Signed)
OT Cancellation Note ? ?Patient Details ?Name: Jeffrey [name] ?MRN: [id number] ?DOB: 05-31-1966 ? ? ?Cancelled Treatment:    Reason Eval/Treat Not Completed: Patient at procedure or test/ unavailable (OT will check back schedule allows.) ? ? ?  ?Jefferey Pica, OTR/L ?Acute Rehabilitation Services ?Office: [phone] ? ? ?[name] ?02/03/2022, 9:05 AM ?

## 2022-02-03 NOTE — Interval H&P Note (Signed)
History and Physical Interval Note: ? ?02/03/2022 ?8:05 AM ? ?Jeffrey [name]  has presented today for surgery, with the diagnosis of Recent melena, anemia.  The various methods of treatment have been discussed with the patient and family. After consideration of risks, benefits and other options for treatment, the patient has consented to  Procedure(s): ?ESOPHAGOGASTRODUODENOSCOPY (EGD) WITH PROPOFOL (N/A) as a surgical intervention.  The patient's history has been reviewed, patient examined, no change in status, stable for surgery.  I have reviewed the patient's chart and labs.  Questions were answered to the patient's satisfaction.   ? ? ?Pricilla Riffle. Fuller Plan ? ? ?

## 2022-02-03 NOTE — Evaluation (Signed)
Physical Therapy Evaluation ?Patient Details ?Name: Jeffrey [name] ?MRN: [id number] ?DOB: [birth year] ?Today's Date: 02/03/2022 ? ?History of Present Illness ? Pt is a 56 y.o. male who presented 02/02/22 with weakness, black stools, and abdominal pain. EGD 4/30. Pt admitted with GI bleed. PMH: HTN, seizure disorder, DM2, hx of ETOH abuse, depression, hx of ABI, erosive esophagitis, cardiac arrest with anoxic brain injury, HLD ?  ?Clinical Impression ? Pt presents with condition above and deficits mentioned below, see PT Problem List. PTA, he was mod I with mobility using a rollator, living with his adult children (work remotely and assist pt as needed) in a 1-level house with 3 STE. Pt endorses x8 falls in past 6 months due to orthostatic hypotension. Currently, pt without lightheadedness and with negative orthostatics, see General Comments below. Pt displays deficits in overall strength, especially in his hip flexors, and deficits in activity tolerance and balance that place him at risk for subsequent falls. Pt with significant lower extremity weakness limiting his ability to come to stand from sit at a standard chair height. Pt required the EOB to be elevated significantly and minA to transfer to stand today. He was able to ambulate laps in the room (limited by IV attached to [facility]) with a RW at a min guard assist level without LOB today. Provided pt's family can provide the level of care pt needs, recommend HHPT follow-up to improve his strength and endurance and reduce his risk for falls or at least improve his independence with floor transfers. Will continue to follow acutely. Pt reporting feeling depressed lately in regards to his medical situation and physical deficits, but denies any suicidal ideation etc. He reports an interest in receiving Psych help to address his depression, RN made aware. Recommend psych consult. ?   ? ?Recommendations for follow up therapy are one component of a multi-disciplinary discharge  planning process, led by the attending physician.  Recommendations may be updated based on patient status, additional functional criteria and insurance authorization. ? ?Follow Up Recommendations Home health PT ? ?  ?Assistance Recommended at Discharge Intermittent Supervision/Assistance  ?Patient can return home with the following ? A little help with walking and/or transfers;A little help with bathing/dressing/bathroom;Assistance with cooking/housework;Assist for transportation;Help with stairs or ramp for entrance ? ?  ?Equipment Recommendations None recommended by PT  ?Recommendations for Other Services ? Other (comment) (Psych for reported depression)  ?  ?Functional Status Assessment Patient has had a recent decline in their functional status and demonstrates the ability to make significant improvements in function in a reasonable and predictable amount of time.  ? ?  ?Precautions / Restrictions Precautions ?Precautions: Fall ?Precaution Comments: watch BP (hx of orthostatic hypotension and falls) ?Restrictions ?Weight Bearing Restrictions: No  ? ?  ? ?Mobility ? Bed Mobility ?Overal bed mobility: Needs Assistance ?Bed Mobility: Sit to Supine ?  ?  ?  ?Sit to supine: Min assist ?  ?General bed mobility comments: MinA to lift legs onto bed with return to supine. ?  ? ?Transfers ?Overall transfer level: Needs assistance ?Equipment used: Rolling walker (2 wheels) ?Transfers: Sit to/from Stand ?Sit to Stand: Min assist, From elevated surface ?  ?  ?  ?  ?  ?General transfer comment: Pt with poor follow through of initiation with attempts to stand from a standard chair height from EOB, requesting PT elevated EOB significantly before pt progressed his transfer attempts, needing minA to complete sit > stand to RW. ?  ? ?Ambulation/Gait ?Ambulation/Gait assistance: Min  guard ?Gait Distance (Feet): 48 Feet ?Assistive device: Rolling walker (2 wheels) ?Gait Pattern/deviations: Step-through pattern, Decreased stride  length ?Gait velocity: reduced ?Gait velocity interpretation: <1.8 ft/sec, indicate of risk for recurrent falls ?  ?General Gait Details: Pt with slow, but fairly steady steps in room, ambulating laps due to being limited by IV line attached to [facility]. No LOB, min guard for safety. ? ?Stairs ?  ?  ?  ?  ?  ? ?Wheelchair Mobility ?  ? ?Modified Rankin (Stroke Patients Only) ?  ? ?  ? ?Balance Overall balance assessment: Needs assistance ?Sitting-balance support: Feet supported ?Sitting balance-Leahy Scale: Good ?  ?  ?Standing balance support: Bilateral upper extremity supported ?Standing balance-Leahy Scale: Poor ?Standing balance comment: reliant on Rw ?  ?  ?  ?  ?  ?  ?  ?  ?  ?  ?  ?   ? ? ? ?Pertinent Vitals/Pain Pain Assessment ?Pain Assessment: No/denies pain  ? ? ?Home Living Family/patient expects to be discharged to:: Private residence ?Living Arrangements: Children ?Available Help at Discharge: Family;Available 24 hours/day ?Type of Home: House ?Home Access: Stairs to enter ?Entrance Stairs-Rails: Right ?Entrance Stairs-Number of Steps: 3 ?  ?Home Layout: One level ?Home Equipment: Merchant navy officer (4 wheels);Adaptive equipment;Tub bench;Rolling Walker (2 wheels) ?   ?  ?Prior Function Prior Level of Function : Independent/Modified Independent ?  ?  ?  ?  ?  ?  ?Mobility Comments: x8 falls in past 6 months due to low BP per pt. Uses rollator ?ADLs Comments: Modified Independent with ADLs; minimal driving; delivery for groceries.  Pt reports progressing beyond using tub transfer bench. ?  ? ? ?Hand Dominance  ? Dominant Hand: Right ? ?  ?Extremity/Trunk Assessment  ? Upper Extremity Assessment ?Upper Extremity Assessment: Defer to OT evaluation ?LUE Deficits / Details: edema and less coordination and decreased ROM at shoulder and elbow ?LUE Coordination: decreased fine motor;decreased gross motor ?  ? ?Lower Extremity Assessment ?Lower Extremity Assessment: Generalized weakness (MMT scores of 3+ bil hip  flexion and grossly 4 to 4+ elsewhere) ?  ? ?Cervical / Trunk Assessment ?Cervical / Trunk Assessment: Normal  ?Communication  ? Communication: No difficulties;Expressive difficulties  ?Cognition Arousal/Alertness: Awake/alert ?Behavior During Therapy: [facility] for tasks assessed/performed ?Overall Cognitive Status: History of cognitive impairments - at baseline ?  ?  ?  ?  ?  ?  ?  ?  ?  ?  ?  ?  ?  ?  ?  ?  ?General Comments: answering all questions appropriately. Poor attention at times, tangential ?  ?  ? ?  ?General Comments General comments (skin integrity, edema, etc.): BP 102/77 sitting, 101/78 standing, 99/81 standing ~3 min, 117/77 while ambulating, 113/83 sitting after gait bout; denied lightheadedness throughout; pt reporting depression in regards to his current situation, RN made aware of this and pt's desire to start therapy for it, pt denying suicidal ideation etc ? ?  ?Exercises    ? ?Assessment/Plan  ?  ?PT Assessment Patient needs continued PT services  ?PT Problem List Decreased strength;Decreased balance;Decreased mobility;Decreased activity tolerance;Decreased cognition;Cardiopulmonary status limiting activity ? ?   ?  ?PT Treatment Interventions Gait training;DME instruction;Stair training;Functional mobility training;Therapeutic activities;Therapeutic exercise;Balance training;Patient/family education;Neuromuscular re-education;Cognitive remediation   ? ?PT Goals (Current goals can be found in the Care Plan section)  ?Acute Rehab PT Goals ?Patient Stated Goal: to go home with HHPT and get help for his depression ?PT Goal Formulation: With patient ?  Time For Goal Achievement: 02/17/22 ?Potential to Achieve Goals: Good ? ?  ?Frequency Min 3X/week ?  ? ? ?Co-evaluation   ?  ?  ?  ?  ? ? ?  ?AM-PAC PT "6 Clicks" Mobility  ?Outcome Measure Help needed turning from your back to your side while in a flat bed without using bedrails?: A Little ?Help needed moving from lying on your back to sitting on the  side of a flat bed without using bedrails?: A Little ?Help needed moving to and from a bed to a chair (including a wheelchair)?: A Little ?Help needed standing up from a chair using your arms (e.g., wheelcha

## 2022-02-03 NOTE — Transfer of Care (Signed)
Immediate Anesthesia Transfer of Care Note ? ?Patient: Jeffrey [name] ? ?Procedure(s) Performed: ESOPHAGOGASTRODUODENOSCOPY (EGD) WITH PROPOFOL ?BIOPSY ? ?Patient Location: PACU ? ?Anesthesia Type:MAC ? ?Level of Consciousness: drowsy and patient cooperative ? ?Airway & Oxygen Therapy: Patient Spontanous Breathing and Patient connected to face mask oxygen ? ?Post-op Assessment: Report given to RN and Post -op Vital signs reviewed and stable ? ?Post vital signs: Reviewed and stable ? ?Last Vitals:  ?Vitals Value Taken Time  ?BP 101/58 02/03/22 0846  ?Temp    ?Pulse 94 02/03/22 0847  ?Resp 29 02/03/22 0847  ?SpO2 100 % 02/03/22 0847  ?Vitals shown include unvalidated device data. ? ?Last Pain:  ?Vitals:  ? 02/03/22 0737  ?TempSrc: Temporal  ?PainSc: 0-No pain  ?   ? ?Patients Stated Pain Goal: 0 (02/02/22 1847) ? ?Complications: No notable events documented. ?

## 2022-02-03 NOTE — Anesthesia Postprocedure Evaluation (Signed)
Anesthesia Post Note ? ?Patient: Jeffrey [name] ? ?Procedure(s) Performed: ESOPHAGOGASTRODUODENOSCOPY (EGD) WITH PROPOFOL ?BIOPSY ? ?  ? ?Patient location during evaluation: PACU ?Anesthesia Type: MAC ?Level of consciousness: awake and alert ?Pain management: pain level controlled ?Vital Signs Assessment: post-procedure vital signs reviewed and stable ?Respiratory status: spontaneous breathing, nonlabored ventilation, respiratory function stable and patient connected to nasal cannula oxygen ?Cardiovascular status: stable and blood pressure returned to baseline ?Postop Assessment: no apparent nausea or vomiting ?Anesthetic complications: no ? ? ?No notable events documented. ? ?Last Vitals:  ?Vitals:  ? 02/03/22 2000 02/03/22 2003  ?BP:  129/76  ?Pulse:  85  ?Resp: 18 15  ?Temp:  (!) 36.3 ?C  ?SpO2:  99%  ?  ?Last Pain:  ?Vitals:  ? 02/03/22 2003  ?TempSrc: Axillary  ?PainSc:   ? ? ?  ?  ?  ?  ?  ?  ? ?Suzette Battiest E ? ? ? ? ?

## 2022-02-03 NOTE — Progress Notes (Signed)
FPTS Brief Progress Note ? ?S:Went to bedside to see patient, patient sleeping comfortably, didn't disturb.  ? ? ?O: ?BP 129/76 (BP Location: Left Arm)   Pulse 85   Temp (!) 97.4 ?F (36.3 ?C) (Axillary)   Resp 15   Ht '5\' 10"'$  (1.778 m)   Wt 119.6 kg   SpO2 99%   BMI 37.84 kg/m?   ? ? ?A/P: ?- Plans per day team ?- Orders reviewed. Labs for AM ordered, which was adjusted as needed.  ? ? ?Holley Bouche, MD ?02/03/2022, 10:04 PM ?PGY-1, [place] Night Resident  ?Please page [phone] with questions.  ? ? ?

## 2022-02-03 NOTE — Evaluation (Signed)
Occupational Therapy Evaluation ?Patient Details ?Name: Jeffrey [name] ?MRN: [id number] ?DOB: [birth year] ?Today's Date: 02/03/2022 ? ? ?History of Present Illness Pt is a 56 y.o. male who presented 02/02/22 with weakness, black stools, and abdominal pain. EGD 4/30. Pt admitted with GI bleed. PMH: HTN, seizure disorder, DM2, hx of ETOH abuse, depression, hx of ABI, erosive esophagitis, cardiac arrest with anoxic brain injury, HLD  ? ?Clinical Impression ?  ?Pt PTA: pt reports independence; uses [employer] delivery for groceries and public transportation for appts. Pt has children living with him who work remotely for work- assist pt as needed. Pt CLOF: Pt limited by decreased activity tolerance, decreased ability to care for self and increased need for assist.  Pt set-upA to minA overall for ADL tasks. Pt fatigues easily and requires cues to sit down. Pt took many seated rest breaks for grooming in standing at sink. Pt would benefit from continued OT skilled servuices. OT following acutely. ?   ? ?Recommendations for follow up therapy are one component of a multi-disciplinary discharge planning process, led by the attending physician.  Recommendations may be updated based on patient status, additional functional criteria and insurance authorization.  ? ?Follow Up Recommendations ? Home health OT  ?  ?Assistance Recommended at Discharge Intermittent Supervision/Assistance  ?Patient can return home with the following Assistance with cooking/housework;Assist for transportation ? ?  ?Functional Status Assessment ? Patient has had a recent decline in their functional status and demonstrates the ability to make significant improvements in function in a reasonable and predictable amount of time.  ?Equipment Recommendations ? None recommended by OT  ?  ?Recommendations for Other Services   ? ? ?  ?Precautions / Restrictions Precautions ?Precautions: Fall ?Restrictions ?Weight Bearing Restrictions: No  ? ?  ? ?Mobility Bed  Mobility ?Overal bed mobility: Modified Independent ?Bed Mobility: Supine to Sit ?  ?  ?Supine to sit: Modified independent (Device/Increase time) ?  ?  ?General bed mobility comments: sat EOB to conclude session to eat ?  ? ?Transfers ?Overall transfer level: Needs assistance ?Equipment used: Rolling walker (2 wheels) ?Transfers: Sit to/from Stand ?Sit to Stand: Min guard ?  ?  ?  ?  ?  ?General transfer comment: minguardA; no LOB ?  ? ?  ?Balance Overall balance assessment: Needs assistance ?Sitting-balance support: Feet supported ?Sitting balance-Leahy Scale: Good ?  ?  ?Standing balance support: Bilateral upper extremity supported ?Standing balance-Leahy Scale: Poor ?Standing balance comment: reliant on Rw ?  ?  ?  ?  ?  ?  ?  ?  ?  ?  ?  ?   ? ?ADL either performed or assessed with clinical judgement  ? ?ADL Overall ADL's : Needs assistance/impaired ?Eating/Feeding: Independent;Sitting ?Eating/Feeding Details (indicate cue type and reason): EOB ?Grooming: Oral care;Wash/dry face;Wash/dry hands;Sitting;Set up ?Grooming Details (indicate cue type and reason): stood at sink x10 mins with rest breaks ?Upper Body Bathing: Set up;Sitting ?  ?Lower Body Bathing: Minimal assistance;Sitting/lateral leans;Sit to/from stand;Cueing for safety ?  ?Upper Body Dressing : Set up;Sitting ?  ?Lower Body Dressing: Minimal assistance;Sitting/lateral leans ?  ?Toilet Transfer: Min guard;Cueing for safety;Cueing for sequencing;Ambulation;Rolling walker (2 wheels) ?Toilet Transfer Details (indicate cue type and reason): no LOB ?Toileting- Clothing Manipulation and Hygiene: Minimal assistance;Sit to/from stand ?  ?  ?  ?Functional mobility during ADLs: Min guard;Rolling walker (2 wheels) ?General ADL Comments: Pt limited by decreased activity tolerance, decreased ability to care for self and increased need for assist.  ? ? ? ?  Vision Baseline Vision/History: 1 Wears glasses ?Ability to See in Adequate Light: 0 Adequate ?Patient Visual  Report: No change from baseline ?Vision Assessment?: No apparent visual deficits  ?   ?Perception   ?  ?Praxis   ?  ? ?Pertinent Vitals/Pain Pain Assessment ?Pain Assessment: No/denies pain  ? ? ? ?Hand Dominance Right ?  ?Extremity/Trunk Assessment Upper Extremity Assessment ?Upper Extremity Assessment: Generalized weakness;LUE deficits/detail ?LUE Deficits / Details: edema and less coordination and decreased ROM at shoulder and elbow ?LUE Coordination: decreased fine motor;decreased gross motor ?  ?Lower Extremity Assessment ?Lower Extremity Assessment: Generalized weakness ?  ?Cervical / Trunk Assessment ?Cervical / Trunk Assessment: Normal ?  ?Communication Communication ?Communication: No difficulties;Expressive difficulties ?  ?Cognition Arousal/Alertness: Awake/alert ?Behavior During Therapy: [facility] for tasks assessed/performed ?Overall Cognitive Status: History of cognitive impairments - at baseline ?  ?  ?  ?  ?  ?  ?  ?  ?  ?  ?  ?  ?  ?  ?  ?  ?General Comments: answering all questions appropriately. ?  ?  ?General Comments  O2 >90%; 100-110s BPM with exertion. ? ?  ?Exercises   ?  ?Shoulder Instructions    ? ? ?Home Living Family/patient expects to be discharged to:: Private residence ?Living Arrangements: Children ?Available Help at Discharge: Family;Available 24 hours/day ?Type of Home: House ?Home Access: Stairs to enter ?Entrance Stairs-Number of Steps: 3 ?Entrance Stairs-Rails: Right ?Home Layout: One level ?  ?  ?Bathroom Shower/Tub: Tub/shower unit ?  ?Bathroom Toilet: Handicapped height ?  ?  ?Home Equipment: Merchant navy officer (4 wheels);Adaptive equipment;Tub bench;Rolling Walker (2 wheels) ?Adaptive Equipment: Reacher;Sock aid ?  ?  ? ?  ?Prior Functioning/Environment Prior Level of Function : Independent/Modified Independent ?  ?  ?  ?  ?  ?  ?Mobility Comments: x8 falls in past 6 months due to low BP per pt ?ADLs Comments: Modified Independent with ADLs; minimal driving; delivery for  groceries.  Pt reports progressing beyond using tub transfer bench. ?  ? ?  ?  ?OT Problem List: Decreased strength;Decreased activity tolerance;Impaired balance (sitting and/or standing);Decreased safety awareness;Increased edema ?  ?   ?OT Treatment/Interventions: Self-care/ADL training;Therapeutic exercise;Energy conservation;Therapeutic activities;Patient/family education;Balance training  ?  ?OT Goals(Current goals can be found in the care plan section) Acute Rehab OT Goals ?Patient Stated Goal: to go home ?OT Goal Formulation: With patient ?Time For Goal Achievement: 02/17/22 ?Potential to Achieve Goals: Good ?ADL Goals ?Pt Will Perform Lower Body Dressing: with set-up;sitting/lateral leans;sit to/from stand ?Additional ADL Goal #1: pt will increase to x15 mins standing at sink with supervisionA for OOB ADl tasks. ?Additional ADL Goal #2: pt will state 3 energy conservation techniques for OOB ADL and mobility.  ?OT Frequency: Min 2X/week ?  ? ?Co-evaluation   ?  ?  ?  ?  ? ?  ?AM-PAC OT "6 Clicks" Daily Activity     ?Outcome Measure Help from another person eating meals?: None ?Help from another person taking care of personal grooming?: A Little ?Help from another person toileting, which includes using toliet, bedpan, or urinal?: A Little ?Help from another person bathing (including washing, rinsing, drying)?: A Little ?Help from another person to put on and taking off regular upper body clothing?: A Little ?Help from another person to put on and taking off regular lower body clothing?: A Little ?6 Click Score: 19 ?  ?End of Session Equipment Utilized During Treatment: Gait belt;Rolling walker (2 wheels) ?Nurse Communication: Mobility  status ? ?Activity Tolerance: Patient tolerated treatment well ?Patient left: in bed;with call bell/phone within reach;with bed alarm set ? ?OT Visit Diagnosis: Unsteadiness on feet (R26.81);Muscle weakness (generalized) (M62.81)  ?              ?Time: 3837-7939 ?OT Time  Calculation (min): 42 min ?Charges:  OT General Charges ?$OT Visit: 1 Visit ?OT Evaluation ?$OT Eval Moderate Complexity: 1 Mod ?OT Treatments ?$Self Care/Home Management : 8-22 mins ?$Therapeutic Activity: 8-22 mins ? ?All

## 2022-02-04 ENCOUNTER — Encounter (HOSPITAL_COMMUNITY): Payer: Self-pay | Admitting: Gastroenterology

## 2022-02-04 DIAGNOSIS — D638 Anemia in other chronic diseases classified elsewhere: Secondary | ICD-10-CM | POA: Diagnosis not present

## 2022-02-04 DIAGNOSIS — N179 Acute kidney failure, unspecified: Secondary | ICD-10-CM | POA: Diagnosis not present

## 2022-02-04 DIAGNOSIS — D649 Anemia, unspecified: Secondary | ICD-10-CM

## 2022-02-04 DIAGNOSIS — K7031 Alcoholic cirrhosis of liver with ascites: Secondary | ICD-10-CM | POA: Diagnosis not present

## 2022-02-04 DIAGNOSIS — E1140 Type 2 diabetes mellitus with diabetic neuropathy, unspecified: Secondary | ICD-10-CM | POA: Diagnosis not present

## 2022-02-04 LAB — CBC
HCT: 31.2 % — ABNORMAL LOW (ref 39.0–52.0)
Hemoglobin: 10 g/dL — ABNORMAL LOW (ref 13.0–17.0)
MCH: 27 pg (ref 26.0–34.0)
MCHC: 32.1 g/dL (ref 30.0–36.0)
MCV: 84.3 fL (ref 80.0–100.0)
Platelets: 507 10*3/uL — ABNORMAL HIGH (ref 150–400)
RBC: 3.7 MIL/uL — ABNORMAL LOW (ref 4.22–5.81)
RDW: 22.5 % — ABNORMAL HIGH (ref 11.5–15.5)
WBC: 10.7 10*3/uL — ABNORMAL HIGH (ref 4.0–10.5)
nRBC: 0 % (ref 0.0–0.2)

## 2022-02-04 LAB — COMPREHENSIVE METABOLIC PANEL
ALT: 28 U/L (ref 0–44)
AST: 23 U/L (ref 15–41)
Albumin: <1.5 g/dL — ABNORMAL LOW (ref 3.5–5.0)
Alkaline Phosphatase: 225 U/L — ABNORMAL HIGH (ref 38–126)
Anion gap: 8 (ref 5–15)
BUN: 29 mg/dL — ABNORMAL HIGH (ref 6–20)
CO2: 22 mmol/L (ref 22–32)
Calcium: 7.6 mg/dL — ABNORMAL LOW (ref 8.9–10.3)
Chloride: 101 mmol/L (ref 98–111)
Creatinine, Ser: 3.58 mg/dL — ABNORMAL HIGH (ref 0.61–1.24)
GFR, Estimated: 19 mL/min — ABNORMAL LOW (ref >60)
Glucose, Bld: 339 mg/dL — ABNORMAL HIGH (ref 70–99)
Potassium: 4.8 mmol/L (ref 3.5–5.1)
Sodium: 131 mmol/L — ABNORMAL LOW (ref 135–145)
Total Bilirubin: 0.3 mg/dL (ref 0.3–1.2)
Total Protein: 6.1 g/dL — ABNORMAL LOW (ref 6.5–8.1)

## 2022-02-04 LAB — GLUCOSE, CAPILLARY
Glucose-Capillary: 301 mg/dL — ABNORMAL HIGH (ref 70–99)
Glucose-Capillary: 247 mg/dL — ABNORMAL HIGH (ref 70–99)
Glucose-Capillary: 223 mg/dL — ABNORMAL HIGH (ref 70–99)
Glucose-Capillary: 294 mg/dL — ABNORMAL HIGH (ref 70–99)

## 2022-02-04 LAB — CYTOLOGY - NON PAP

## 2022-02-04 MED ORDER — INSULIN GLARGINE-YFGN 100 UNIT/ML ~~LOC~~ SOLN
5.0000 [IU] | Freq: Every day | SUBCUTANEOUS | Status: DC
  Administered 2022-02-04 – 2022-02-05 (×2): 5 [IU] via SUBCUTANEOUS
  Filled 2022-02-04 (×2): qty 0.05

## 2022-02-04 MED ORDER — TAMSULOSIN HCL 0.4 MG PO CAPS
0.4000 mg | ORAL_CAPSULE | Freq: Every day | ORAL | Status: DC
  Administered 2022-02-04 – 2022-02-11 (×8): 0.4 mg via ORAL
  Filled 2022-02-04 (×8): qty 1

## 2022-02-04 MED ORDER — ALBUMIN HUMAN 25 % IV SOLN
50.0000 g | Freq: Once | INTRAVENOUS | Status: AC
  Administered 2022-02-04: 50 g via INTRAVENOUS
  Filled 2022-02-04: qty 200

## 2022-02-04 NOTE — Plan of Care (Signed)
  Problem: Health Behavior/Discharge Planning: Goal: Ability to manage health-related needs will improve Outcome: Progressing   Problem: Clinical Measurements: Goal: Will remain free from infection Outcome: Progressing Goal: Cardiovascular complication will be avoided Outcome: Progressing   Problem: Activity: Goal: Risk for activity intolerance will decrease Outcome: Progressing   

## 2022-02-04 NOTE — Progress Notes (Signed)
Primary RN informed of BS volume.  ?

## 2022-02-04 NOTE — Progress Notes (Signed)
Inpatient Diabetes Program Recommendations ? ?AACE/ADA: New Consensus Statement on Inpatient Glycemic Control (2015) ? ?Target Ranges:  Prepandial:   less than 140 mg/dL ?     Peak postprandial:   less than 180 mg/dL (1-2 hours) ?     Critically ill patients:  140 - 180 mg/dL  ? ?Lab Results  ?Component Value Date  ? GLUCAP 301 (H) 02/04/2022  ? HGBA1C 8.3 (H) 02/02/2022  ? ? Latest Reference Range & Units 02/03/22 03:07 02/03/22 08:52 02/03/22 12:13 02/03/22 16:58 02/04/22 06:43  ?Glucose-Capillary 70 - 99 mg/dL 200 (H) 186 (H) 219 (H) 258 (H) 301 (H)  ?(H): Data is abnormally high ?Review of Glycemic Control ? ?Diabetes history: type 2 ?Outpatient Diabetes medications: Metformin 448 mg BID, Trulicity 1.85 mg weekly ?Current orders for Inpatient glycemic control: Novolog 0-6 units TID ? ?Inpatient Diabetes Program Recommendations:   ?Noted that patient's blood sugars have been greater than 200 mg/dl. Recommend starting Semglee 18 units daily (119 kg X 0.15 units/kg=17 units) and continuing Novolog 0-6 units TID and add Novolog 0-5 units HS scale.  ? ?Noted that patient's GFR is currently 19. Recommend discontinuing Metformin at home..  ? ?Will continue to monitor blood sugars while in the hospital. ? ?Harvel Ricks RN BSN CDE ?Diabetes Coordinator ?Pager: [phone]  8am-5pm  ? ?

## 2022-02-04 NOTE — Progress Notes (Signed)
? ?       Daily Rounding Note ? ?02/04/2022, 11:21 AM ? LOS: 2 days  ? ?SUBJECTIVE:   ?Chief complaint:  Melenic stool, GIB.  Esophagitis, doudenal erosiions, portal gastropathy.     ? ?Stool is less dark.  No n/v. ?Biggest complaint is difficulty voiding.   ? ?OBJECTIVE:        ? Vital signs in last 24 hours:    ?Temp:  [97.4 ?F (36.3 ?C)-98.2 ?F (36.8 ?C)] 97.8 ?F (36.6 ?C) (05/01 3154) ?Pulse Rate:  [85-100] 94 (05/01 0815) ?Resp:  [9-18] 18 (05/01 0815) ?BP: (117-181)/(76-108) 167/98 (05/01 0815) ?SpO2:  [98 %-100 %] 100 % (05/01 0815) ?Weight:  [119.4 kg] 119.4 kg (05/01 0503) ?Last BM Date : 01/28/22 (Per the patient, PTA) ?Filed Weights  ? 02/02/22 0322 02/03/22 0314 02/04/22 0503  ?Weight: 110 kg 119.6 kg 119.4 kg  ? ?General: looks chronically ill, morbidly obese.  Sleeping heavily on entry to room but aroused w moderate stimulation and remained alert   ?Heart: RRR ?Chest: dimished BS globally w no adventitious sounds or cough or labored breathing ?Abdomen: soft, markedly obese, NT.  BS active  ?Extremities: + significant LE edema w dry/woody dermatitis in legs ?Neuro/Psych:  pleasant, appropriate.  Speech slow but clear and precise.  No asterixis.   ? ?Intake/Output from previous day: ?04/30 0701 - 05/01 0700 ?In: 680 [P.O.:480; I.V.:200] ?Out: 560 [Urine:560] ? ?Intake/Output this shift: ?No intake/output data recorded. ? ?Lab Results: ?Recent Labs  ?  02/02/22 ?0086 02/03/22 ?0421 02/04/22 ?7619  ?WBC 9.4 9.5 10.7*  ?HGB 8.1* 8.1* 10.0*  ?HCT 24.9* 24.9* 31.2*  ?PLT 378 464* 507*  ? ?BMET ?Recent Labs  ?  02/02/22 ?5093 02/03/22 ?0421 02/04/22 ?2671  ?NA 129* 131* 131*  ?K 4.4 4.7 4.8  ?CL 102 103 101  ?CO2 15* 20* 22  ?GLUCOSE 541* 217* 339*  ?BUN 28* 26* 29*  ?CREATININE 3.59* 3.57* 3.58*  ?CALCIUM 6.9* 7.2* 7.6*  ? ?LFT ?Recent Labs  ?  02/02/22 ?2458 02/03/22 ?0421 02/04/22 ?0998  ?PROT 4.8* 5.2* 6.1*  ?ALBUMIN <1.5* <1.5* <1.5*  ?AST $Rem'26 26 23  'hSEl$ ?ALT  $Rem'24 27 28  'gOuU$ ?ALKPHOS 165* 162* 225*  ?BILITOT 0.1* 0.5 0.3  ? ?PT/INR ?Recent Labs  ?  02/02/22 ?0515  ?LABPROT 16.7*  ?INR 1.4*  ? ?Hepatitis Panel ?No results for input(s): HEPBSAG, HCVAB, HEPAIGM, HEPBIGM in the last 72 hours. ? ?Studies/Results: ?US RENAL ? ?Result Date: 02/03/2022 ?CLINICAL DATA:  Acute kidney injury. EXAM: RENAL / URINARY TRACT ULTRASOUND COMPLETE COMPARISON:  None FINDINGS: Right Kidney: Renal measurements: 12.5 x 5.1 x 6.2 cm = volume: 204 mL. Increased cortical echogenicity is suggested. Note that assessment of the bilateral kidneys markedly limited by patient body habitus and presence of body wall edema. No hydronephrosis. Left Kidney: Renal measurements: 12.7 x 5.2 x 5.5 cm = volume: 189 mL. No hydronephrosis. Suggestion of increased cortical echogenicity. Very limited assessment. Bladder: Appears normal for degree of bladder distention. Other: Intra-abdominal ascites and marked hepatic steatosis. IMPRESSION: Very limited exam, suggestion of increased cortical echogenicity which may be a sign of medical renal disease. There is no hydronephrosis. Marked hepatic steatosis and signs of ascites. Generalized body wall edema could be seen in the setting of anasarca. Electronically Signed   By: Zetta Bills M.D.   On: 02/03/2022 15:45  ? ?US Paracentesis ? ?Result Date: 02/02/2022 ?INDICATION: Alcoholic cirrhosis with ascites. EXAM: ULTRASOUND GUIDED PARACENTESIS MEDICATIONS: 1% lidocaine 15 mL COMPLICATIONS: None  immediate. PROCEDURE: Informed written consent was obtained from the patient after a discussion of the risks, benefits and alternatives to treatment. A timeout was performed prior to the initiation of the procedure. Initial ultrasound scanning demonstrates a moderate amount of ascites within the right lower abdominal quadrant. The right lower abdomen was prepped and draped in the usual sterile fashion. 1% lidocaine was used for local anesthesia. Following this, a 19 gauge, 10-cm, Yueh  catheter was introduced. An ultrasound image was saved for documentation purposes. The paracentesis was performed. The catheter was removed and a dressing was applied. The patient tolerated the procedure well without immediate post procedural complication. FINDINGS: A total of approximately 1.6 L of clear colorless fluid was removed. Samples were sent to the laboratory as requested by the clinical team. IMPRESSION: Successful ultrasound-guided paracentesis yielding 1.6 liters of peritoneal fluid. Procedure performed by: Gareth Eagle, PA-C PLAN: If the patient eventually requires >/=2 paracenteses in a 30 day period, candidacy for formal evaluation by the [facility] will be assessed. Michaelle Birks, MD [facility] Electronically Signed   By: Michaelle Birks M.D.   On: 02/02/2022 16:54   ? ?Scheduled Meds: ? feeding supplement  1 Container Oral TID BM  ? hydrocortisone  15 mg Oral q AM  ? And  ? hydrocortisone  10 mg Oral QPM  ? insulin aspart  0-6 Units Subcutaneous TID WC  ? insulin glargine-yfgn  5 Units Subcutaneous Daily  ? levETIRAcetam  500 mg Oral BID  ? mirtazapine  30 mg Oral QHS  ? multivitamin with minerals  1 tablet Oral Daily  ? pantoprazole  40 mg Oral BID  ? rosuvastatin  5 mg Oral Daily  ? tamsulosin  0.4 mg Oral Daily  ? thiamine  100 mg Oral QHS  ? ?Continuous Infusions: ? cefTRIAXone (ROCEPHIN)  IV 1 g (02/03/22 1354)  ? ?PRN Meds:.  ?ASSESMENT:  ? ?GI bleed, melena.  History of severe esophagitis, gastritis, duodenitis.  Multiple prior EGDs, negative for H. pylori or malignancy/dysplasia.[name] ?Colonoscopy 12/23/2021: Diverticulosis scattered throughout colon otherwise normal study. ?02/03/2022 EGD with severe, grade D esophagitis but no active bleeding.  Small hiatal hernia.  Mild portal hypertensive gastropathy, without bleeding, biopsied.  Duodenal erosion, not bleeding.  Multiple nonbleeding  superficial duodenal ulcers with flat pigmented spot in second duodenum, largest 7 mm diameter. ? 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  MELD 27.  Still active alcohol abuse PTA (pt says only recent ETOH was some beer while watching [employer]/march madness tournament in March that O/w not ETOH).  Abdominal ultrasound revealed abdominal ascites, diffuse fatty liver and suspicion for underlying cirrhosis, no suspicious liver lesions.  T. bili, transaminases normal but alk phos elevated and albumin less than 1.5. ? ?Ascites.  02/02/2022 paracentesis with removal of 1.6 L fluid.  Fluid studies negative for SBP.  Not able to accurately measure SAAG as both fluid albumin and ascitic fluid albumin reported as <1.5.  Outpatient meds include Lasix 40 mg/day, no other diuretics. ? ?  [place] anemia, chronic.  Hgb rising 8.1  .[name]  10.  Though has not received transfusion.  Patient on oral iron, folic acid, thiamine, multivitamin as outpatient ? ?Coagulopathy with INR 1.4. ? ?NIDDM.  A1c 8.3.  Glucose 541 at admission.  Metformin now discontinued. ? ?AKI.  Established diagnosis CKD.  GFR has gone from 51 2 weeks ago to 19 this admission.  UA relevant for presence of RBCs and proteinuria  as well as glucose.  Urine sodium less than 10.  Diabetes coordinator has discontinued metformin.  Received IV albumin this afternoon.   ? ?  OSA, morbid obesity, on CPAP.   ? ?PLAN  ? ?  Continue Protonix 40 mg po bid.   Pt confirms he was taking this 40 mg daily PTA.  Continue 2 gm Na diet.    ? ?  Since no SBP, does he still need daily Rocephin? Currently day 3.   ? ? ?   ? ? ? ?Azucena Freed  02/04/2022, 11:21 AM ?Phone [phone]  ?

## 2022-02-04 NOTE — Progress Notes (Addendum)
Family Medicine Teaching Service ?Daily Progress Note ?Intern Pager: [phone] ? ?Patient name: Jeffrey [name] Medical record number: [id number] ?Date of birth: [birth year] Age: 56 y.o. Gender: male ? ?Primary Care Provider: [name], MD ?Consultants: GI ?Code Status: Full status ? ?Pt Overview and Major Events to Date:  ?4/29: Admitted ?4/30: EGD showed LA grade D esophagitis ? ?Assessment and Plan: ?[name] is a 56 year old male admitted for melena, abdominal distention, and AKI; concern for GI bleed, and worsening alcoholic cirrhosis. PMH is significant for alcoholic cirrhosis, history of heavy etOH use, hx of erosive esophagitis, T2DM, C-difficile infection, cardiac arrest with anoxic brain injury, seizure disorder, depression, HLD.  ? ?Generalized weakness  Black Stools  Erosive esophagitis  Weight loss ?Stable. No further dark-colored stools since admission. Hgb stable at 10.0 from 8.1. ?- Full liquid diet- advance as tolerated ?- Daily CBC ?- Protonix '40mg'$  BID ?- Avoid NSAIDs ?- PT/OT (recommend [facility]/OT) ? ?BLLE/UE Edema  ETOH cirrhosis  Hypoalbuminemia ?Poor prognosis regarding liver disease (MELD score 22 with 19.6% 90 day mortality). S/p paracentesis (1.6L).  ?- Awaiting paracentesis labs ?- SBP ppx w/ Rocephin 1g q24h ?- RD reccs for nutrition support (Boost Breeze TID) ? ?AKI on CKD, pre-renal ?Cr persistently elevated 3.58. FeNa 0.1%. Renal U/s without hydronephrosis. Patient unable to void properly having issue initiating. Likely dehydration with hepatorenal syndrome. Bladder scans without >300 mL urine thus far- 213, 285. ?- Daily Cr with am CMP ?- Bladder scans q8h ?- Consider starting Flomax ? ?Secondary adrenal insuffiecency ?Continue hydrocortisone 15 mg am and 10 mg pm. ?- Needs o/p endo f/u ? ?Uncontrolled brittle T2DM ?Glucose in 200-300s. ?- Very sensitive SSI ?- Continue CBG checks ?- Can consider low-dose Lantus ? ?HTN  Orthostasis ?BP generally well-controlled. Will continue  to monitor. ? ?Other conditions chronic and stable ?HLD, Seizure disorder, Hx PEA arrest with anoxic brain injury ? ?FEN/GI: Clear liquid diet ?PPx: SCDs, high-bleeding risk ?Dispo:Pending continued work-up ? ?Subjective:  ?Feeling okay this morning. Says he has issues with urinating. No BM since admission. Wants to eat. ? ?Objective: ?Temp:  [96.9 ?F (36.1 ?C)-98.2 ?F (36.8 ?C)] 98.2 ?F (36.8 ?C) (05/01 1517) ?Pulse Rate:  [85-107] 90 (04/30 2346) ?Resp:  [15-32] 15 (04/30 2003) ?BP: (101-164)/(58-106) 129/76 (04/30 2003) ?SpO2:  [97 %-100 %] 98 % (04/30 2346) ?Weight:  [119.4 kg] 119.4 kg (05/01 0503) ?Physical Exam: ?General: Awake, alert, no acute distress ?Cardiovascular: RRR, no murmurs ?Respiratory: [place] on face but not in nares, maintaining SpO2 100% on room air. CTAB. ?Abdomen: Soft, non-tender, non-distended. BS x4. ?Extremities: Pitting edema in all extremities. Woody vascular changes of anterior shins bilaterally ? ?Laboratory: ?Recent Labs  ?Lab 02/02/22 ?6160 02/03/22 ?0421 02/04/22 ?7371  ?WBC 9.4 9.5 10.7*  ?HGB 8.1* 8.1* 10.0*  ?HCT 24.9* 24.9* 31.2*  ?PLT 378 464* 507*  ? ?Recent Labs  ?Lab 02/02/22 ?0626 02/03/22 ?0421 02/04/22 ?9485  ?NA 129* 131* 131*  ?K 4.4 4.7 4.8  ?CL 102 103 101  ?CO2 15* 20* 22  ?BUN 28* 26* 29*  ?CREATININE 3.59* 3.57* 3.58*  ?CALCIUM 6.9* 7.2* 7.6*  ?PROT 4.8* 5.2* 6.1*  ?BILITOT 0.1* 0.5 0.3  ?ALKPHOS 165* 162* 225*  ?ALT '24 27 28  '$ ?AST '26 26 23  '$ ?GLUCOSE 541* 217* 339*  ? ? ? ? ?Imaging/Diagnostic Tests: ?US RENAL ? ?Result Date: 02/03/2022 ?CLINICAL DATA:  Acute kidney injury. EXAM: RENAL / URINARY TRACT ULTRASOUND COMPLETE COMPARISON:  None FINDINGS: Right Kidney: Renal measurements: 12.5 x 5.1  x 6.2 cm = volume: 204 mL. Increased cortical echogenicity is suggested. Note that assessment of the bilateral kidneys markedly limited by patient body habitus and presence of body wall edema. No hydronephrosis. Left Kidney: Renal measurements: 12.7 x 5.2 x 5.5 cm = volume: 189  mL. No hydronephrosis. Suggestion of increased cortical echogenicity. Very limited assessment. Bladder: Appears normal for degree of bladder distention. Other: Intra-abdominal ascites and marked hepatic steatosis. IMPRESSION: Very limited exam, suggestion of increased cortical echogenicity which may be a sign of medical renal disease. There is no hydronephrosis. Marked hepatic steatosis and signs of ascites. Generalized body wall edema could be seen in the setting of anasarca. Electronically Signed   By: Zetta Bills M.D.   On: 02/03/2022 15:45   ? ? ?Orvis Brill, DO ?02/04/2022, 7:01 AM ?PGY-1, [place] ?[place] Intern pager: [phone], text pages welcome ? ?

## 2022-02-04 NOTE — Progress Notes (Signed)
Physical Therapy Treatment ?Patient Details ?Name: Jeffrey [name] ?MRN: [id number] ?DOB: 11/30/1965 ?Today's Date: 02/04/2022 ? ? ?History of Present Illness Pt is a 56 y.o. male who presented 02/02/22 with weakness, black stools, and abdominal pain. EGD 4/30. Pt admitted with GI bleed. PMH: HTN, seizure disorder, DM2, hx of ETOH abuse, depression, hx of ABI, erosive esophagitis, cardiac arrest with anoxic brain injury, HLD ? ?  ?PT Comments  ? ? Patient progressing well towards PT goals. Session focused on progressive ambulation and functional strengthening. Pt able to progress gait with use of RW for support needing 3-4 standing rest breaks due to SOB and fatigue. VSS on RA throughout but with UE weakness due to pushing through RW. Worked on standing from low surfaces as pt having difficulty getting out of chair at home. Interested in working on practicing getting up off the floor due to frequent falls at home. Will follow acutely and plan for stair training next session. ?  ?Recommendations for follow up therapy are one component of a multi-disciplinary discharge planning process, led by the attending physician.  Recommendations may be updated based on patient status, additional functional criteria and insurance authorization. ? ?Follow Up Recommendations ? Home health PT ?  ?  ?Assistance Recommended at Discharge Intermittent Supervision/Assistance  ?Patient can return home with the following A little help with walking and/or transfers;A little help with bathing/dressing/bathroom;Assistance with cooking/housework;Assist for transportation;Help with stairs or ramp for entrance ?  ?Equipment Recommendations ? None recommended by PT  ?  ?Recommendations for Other Services   ? ? ?  ?Precautions / Restrictions Precautions ?Precautions: Fall;Other (comment) ?Precaution Comments: watch BP (hx of orthostatic hypotension and falls) ?Restrictions ?Weight Bearing Restrictions: No  ?  ? ?Mobility ? Bed Mobility ?Overal bed  mobility: Needs Assistance ?Bed Mobility: Supine to Sit ?  ?  ?Supine to sit: Modified independent (Device/Increase time), HOB elevated ?  ?  ?General bed mobility comments: Increased time and use of rail. ?  ? ?Transfers ?Overall transfer level: Needs assistance ?Equipment used: Rolling walker (2 wheels) ?Transfers: Sit to/from Stand ?Sit to Stand: Min guard ?  ?  ?  ?  ?  ?General transfer comment: Min guard to stand from EOB x1 from chair height, from low chair x1 with cues for anterior weight shift and foot placement, LOB backwards during initial stand from chair. ?  ? ?Ambulation/Gait ?Ambulation/Gait assistance: Min guard ?Gait Distance (Feet): 400 Feet ?Assistive device: Rolling walker (2 wheels) ?Gait Pattern/deviations: Step-through pattern, Decreased stride length, Wide base of support ?  ?Gait velocity interpretation: 1.31 - 2.62 ft/sec, indicative of limited community ambulator ?  ?General Gait Details: Slow, mostly steady gait with a few standing rest breaks due to 2/4 DOE and UE weakness. HR up to 117 bpm. 3-4 standing rest breaks. Sp02 100% on RA. ? ? ?Stairs ?  ?  ?  ?  ?  ? ? ?Wheelchair Mobility ?  ? ?Modified Rankin (Stroke Patients Only) ?  ? ? ?  ?Balance Overall balance assessment: Needs assistance ?Sitting-balance support: Feet supported, No upper extremity supported ?Sitting balance-Leahy Scale: Good ?  ?  ?Standing balance support: During functional activity ?Standing balance-Leahy Scale: Poor ?Standing balance comment: reliant on Rw ?  ?  ?  ?  ?  ?  ?  ?  ?  ?  ?  ?  ? ?  ?Cognition Arousal/Alertness: Awake/alert ?Behavior During Therapy: [facility] for tasks assessed/performed ?Overall Cognitive Status: History of cognitive impairments - at baseline ?  ?  ?  ?  ?  ?  ?  ?  ?  ?  ?  ?  ?  ?  ?  ?  ?  General Comments: answering all questions appropriately. Poor attention at times, tangential. Expressing depressed mood recently but "I am going to get better." ?  ?  ? ?  ?Exercises   ? ?  ?General  Comments General comments (skin integrity, edema, etc.): VSS on RA. Continues to report feeling down about his current medical situation however appears to have turned a corner and thinks he will get better. Provided a listening ear. ?  ?  ? ?Pertinent Vitals/Pain Pain Assessment ?Pain Assessment: No/denies pain  ? ? ?Home Living   ?  ?  ?  ?  ?  ?  ?  ?  ?  ?   ?  ?Prior Function    ?  ?  ?   ? ?PT Goals (current goals can now be found in the care plan section) Progress towards PT goals: Progressing toward goals ? ?  ?Frequency ? ? ? Min 3X/week ? ? ? ?  ?PT Plan Current plan remains appropriate  ? ? ?Co-evaluation   ?  ?  ?  ?  ? ?  ?AM-PAC PT "6 Clicks" Mobility   ?Outcome Measure ? Help needed turning from your back to your side while in a flat bed without using bedrails?: A Little ?Help needed moving from lying on your back to sitting on the side of a flat bed without using bedrails?: A Little ?Help needed moving to and from a bed to a chair (including a wheelchair)?: A Little ?Help needed standing up from a chair using your arms (e.g., wheelchair or bedside chair)?: A Little ?Help needed to walk in hospital room?: A Little ?Help needed climbing 3-5 steps with a railing? : A Little ?6 Click Score: 18 ? ?  ?End of Session Equipment Utilized During Treatment: Gait belt ?Activity Tolerance: Patient tolerated treatment well ?Patient left: in chair;with call bell/phone within reach ?Nurse Communication: Mobility status ?PT Visit Diagnosis: Unsteadiness on feet (R26.81);Muscle weakness (generalized) (M62.81);History of falling (Z91.81);Other abnormalities of gait and mobility (R26.89);Repeated falls (R29.6);Difficulty in walking, not elsewhere classified (R26.2) ?  ? ? ?Time: 0881-1031 ?PT Time Calculation (min) (ACUTE ONLY): 36 min ? ?Charges:  $Gait Training: 8-22 mins ?$Therapeutic Activity: 8-22 mins          ?          ? ?Marisa Severin, PT, DPT ?Acute Rehabilitation Services ?Secure chat preferred ?Office  [phone] ? ? ? ? ? ?[place] ?02/04/2022, 3:53 PM ? ?

## 2022-02-05 LAB — PREPARE RBC (CROSSMATCH)

## 2022-02-05 LAB — TYPE AND SCREEN
ABO/RH(D): O POS
Antibody Screen: NEGATIVE

## 2022-02-05 LAB — BASIC METABOLIC PANEL
Anion gap: 8 (ref 5–15)
BUN: 30 mg/dL — ABNORMAL HIGH (ref 6–20)
CO2: 21 mmol/L — ABNORMAL LOW (ref 22–32)
Calcium: 7.7 mg/dL — ABNORMAL LOW (ref 8.9–10.3)
Chloride: 105 mmol/L (ref 98–111)
Creatinine, Ser: 3.63 mg/dL — ABNORMAL HIGH (ref 0.61–1.24)
GFR, Estimated: 19 mL/min — ABNORMAL LOW (ref >60)
Glucose, Bld: 264 mg/dL — ABNORMAL HIGH (ref 70–99)
Potassium: 4.6 mmol/L (ref 3.5–5.1)
Sodium: 134 mmol/L — ABNORMAL LOW (ref 135–145)

## 2022-02-05 LAB — LIPASE, FLUID: Lipase-Fluid: <3 U/L

## 2022-02-05 LAB — CBC
HCT: 22.1 % — ABNORMAL LOW (ref 39.0–52.0)
Hemoglobin: 7.2 g/dL — ABNORMAL LOW (ref 13.0–17.0)
MCH: 27.5 pg (ref 26.0–34.0)
MCHC: 32.6 g/dL (ref 30.0–36.0)
MCV: 84.4 fL (ref 80.0–100.0)
Platelets: 379 10*3/uL (ref 150–400)
RBC: 2.62 MIL/uL — ABNORMAL LOW (ref 4.22–5.81)
RDW: 21.8 % — ABNORMAL HIGH (ref 11.5–15.5)
WBC: 7.4 10*3/uL (ref 4.0–10.5)
nRBC: 0 % (ref 0.0–0.2)

## 2022-02-05 LAB — GLUCOSE, CAPILLARY
Glucose-Capillary: 234 mg/dL — ABNORMAL HIGH (ref 70–99)
Glucose-Capillary: 155 mg/dL — ABNORMAL HIGH (ref 70–99)
Glucose-Capillary: 218 mg/dL — ABNORMAL HIGH (ref 70–99)
Glucose-Capillary: 237 mg/dL — ABNORMAL HIGH (ref 70–99)

## 2022-02-05 LAB — HEMOGLOBIN AND HEMATOCRIT, BLOOD
HCT: 22.5 % — ABNORMAL LOW (ref 39.0–52.0)
HCT: 25.2 % — ABNORMAL LOW (ref 39.0–52.0)
Hemoglobin: 7.3 g/dL — ABNORMAL LOW (ref 13.0–17.0)
Hemoglobin: 8.2 g/dL — ABNORMAL LOW (ref 13.0–17.0)

## 2022-02-05 LAB — PROTEIN / CREATININE RATIO, URINE
Creatinine, Urine: 62.46 mg/dL
Protein Creatinine Ratio: 10.89 mg/mg{Cre} — ABNORMAL HIGH (ref 0.00–0.15)
Total Protein, Urine: 680 mg/dL

## 2022-02-05 LAB — SODIUM, URINE, RANDOM: Sodium, Ur: 48 mmol/L

## 2022-02-05 LAB — CREATININE, URINE, RANDOM: Creatinine, Urine: 63.52 mg/dL

## 2022-02-05 MED ORDER — INSULIN GLARGINE-YFGN 100 UNIT/ML ~~LOC~~ SOLN
10.0000 [IU] | Freq: Every day | SUBCUTANEOUS | Status: DC
Start: 2022-02-06 — End: 2022-02-06
  Administered 2022-02-06: 10 [IU] via SUBCUTANEOUS
  Filled 2022-02-05: qty 0.1

## 2022-02-05 MED ORDER — HYDROCORTISONE 5 MG PO TABS
5.0000 mg | ORAL_TABLET | Freq: Every evening | ORAL | Status: DC
  Administered 2022-02-05 – 2022-02-11 (×7): 5 mg via ORAL
  Filled 2022-02-05 (×7): qty 1

## 2022-02-05 MED ORDER — INSULIN GLARGINE-YFGN 100 UNIT/ML ~~LOC~~ SOLN
5.0000 [IU] | Freq: Every day | SUBCUTANEOUS | Status: DC
  Filled 2022-02-05: qty 0.05

## 2022-02-05 MED ORDER — POLYETHYLENE GLYCOL 3350 17 G PO PACK
17.0000 g | PACK | Freq: Every day | ORAL | Status: DC
  Filled 2022-02-05 (×3): qty 1

## 2022-02-05 MED ORDER — SODIUM CHLORIDE 0.9% IV SOLUTION: Freq: Once | INTRAVENOUS | Status: DC

## 2022-02-05 MED ORDER — PROPRANOLOL HCL ER 60 MG PO CP24: 60.0000 mg | ORAL_CAPSULE | Freq: Every day | ORAL | Status: DC

## 2022-02-05 MED ORDER — PROPRANOLOL HCL 20 MG PO TABS
20.0000 mg | ORAL_TABLET | Freq: Three times a day (TID) | ORAL | Status: DC
  Administered 2022-02-05 – 2022-02-06 (×4): 20 mg via ORAL
  Filled 2022-02-05 (×5): qty 1

## 2022-02-05 MED ORDER — SODIUM CHLORIDE 0.9% IV SOLUTION: Freq: Once | INTRAVENOUS | Status: AC

## 2022-02-05 MED ORDER — ALBUMIN HUMAN 25 % IV SOLN
50.0000 g | Freq: Every day | INTRAVENOUS | Status: AC
  Administered 2022-02-05 – 2022-02-06 (×2): 50 g via INTRAVENOUS
  Filled 2022-02-05 (×2): qty 200

## 2022-02-05 MED ORDER — HYDROCORTISONE 10 MG PO TABS
10.0000 mg | ORAL_TABLET | Freq: Every morning | ORAL | Status: DC
  Administered 2022-02-06 – 2022-02-11 (×6): 10 mg via ORAL
  Filled 2022-02-05 (×8): qty 1

## 2022-02-05 NOTE — Plan of Care (Signed)
?  Problem: Education: Goal: Knowledge of General Education information will improve Description: Including pain rating scale, medication(s)/side effects and non-pharmacologic comfort measures Outcome: Progressing   Problem: Health Behavior/Discharge Planning: Goal: Ability to manage health-related needs will improve Outcome: Progressing   Problem: Clinical Measurements: Goal: Ability to maintain clinical measurements within normal limits will improve Outcome: Progressing Goal: Diagnostic test results will improve Outcome: Progressing   Problem: Activity: Goal: Risk for activity intolerance will decrease Outcome: Progressing   

## 2022-02-05 NOTE — Progress Notes (Signed)
? ?       Daily Rounding Note ? ?02/05/2022, 8:11 AM ? LOS: 3 days  ? ?SUBJECTIVE:   ?Chief complaint:  Melena.  GIB.  Esophagitis, duodenal erosions, portal gastropathy ? ?Hgb dropped overnight.  7.3 this AM.  MD ordered 2 PRBC at 0730, his first transfusions since admit. ?RNs report no BM's, no c/O nausea, no vomiting.  Pt confirms that last BM was on  day of admission.   ? ?OBJECTIVE:        ? Vital signs in last 24 hours:    ?Temp:  [97.6 ?F (36.4 ?C)-97.8 ?F (36.6 ?C)] 97.7 ?F (36.5 ?C) (05/02 4128) ?Pulse Rate:  [94-105] 100 (05/02 0717) ?Resp:  [14-20] 16 (05/02 0717) ?BP: (143-180)/(93-105) 180/105 (05/02 0717) ?SpO2:  [97 %-100 %] 98 % (05/02 0717) ?Weight:  [121.6 kg] 121.6 kg (05/02 0500) ?Last BM Date : 02/27/22 ?Filed Weights  ? 02/03/22 0314 02/04/22 0503 02/05/22 0500  ?Weight: 119.6 kg 119.4 kg 121.6 kg  ? ?General: NAD, resting, comfort.   ?Heart: RRR ?Chest: no labored breathing ?Abdomen: soft, obese, NT  ?Extremities: swelling in R arm and both LE w stasis dermatitis in legs ?Neuro/Psych:  oriented x 3.  Slow motor/speech but good historian.  No asterixis or tremor.   ? ?Intake/Output from previous day: ?05/01 0701 - 05/02 0700 ?In: 480 [P.O.:480] ?Out: 150 [Urine:150] ? ?Intake/Output this shift: ?No intake/output data recorded. ? ?Lab Results: ?Recent Labs  ?  02/03/22 ?0421 02/04/22 ?0316 02/05/22 ?0145 02/05/22 ?7867  ?WBC 9.5 10.7* 7.4  --   ?HGB 8.1* 10.0* 7.2* 7.3*  ?HCT 24.9* 31.2* 22.1* 22.5*  ?PLT 464* 507* 379  --   ? ?BMET ?Recent Labs  ?  02/03/22 ?0421 02/04/22 ?0316 02/05/22 ?0145  ?NA 131* 131* 134*  ?K 4.7 4.8 4.6  ?CL 103 101 105  ?CO2 20* 22 21*  ?GLUCOSE 217* 339* 264*  ?BUN 26* 29* 30*  ?CREATININE 3.57* 3.58* 3.63*  ?CALCIUM 7.2* 7.6* 7.7*  ? ?LFT ?Recent Labs  ?  02/03/22 ?0421 02/04/22 ?0316  ?PROT 5.2* 6.1*  ?ALBUMIN <1.5* <1.5*  ?AST 26 23  ?ALT 27 28  ?ALKPHOS 162* 225*  ?BILITOT 0.5 0.3  ? ?PT/INR ?No results for  input(s): LABPROT, INR in the last 72 hours. ?Hepatitis Panel ?No results for input(s): HEPBSAG, HCVAB, HEPAIGM, HEPBIGM in the last 72 hours. ? ?Studies/Results: ?US RENAL ? ?Result Date: 02/03/2022 ?CLINICAL DATA:  Acute kidney injury. EXAM: RENAL / URINARY TRACT ULTRASOUND COMPLETE COMPARISON:  None FINDINGS: Right Kidney: Renal measurements: 12.5 x 5.1 x 6.2 cm = volume: 204 mL. Increased cortical echogenicity is suggested. Note that assessment of the bilateral kidneys markedly limited by patient body habitus and presence of body wall edema. No hydronephrosis. Left Kidney: Renal measurements: 12.7 x 5.2 x 5.5 cm = volume: 189 mL. No hydronephrosis. Suggestion of increased cortical echogenicity. Very limited assessment. Bladder: Appears normal for degree of bladder distention. Other: Intra-abdominal ascites and marked hepatic steatosis. IMPRESSION: Very limited exam, suggestion of increased cortical echogenicity which may be a sign of medical renal disease. There is no hydronephrosis. Marked hepatic steatosis and signs of ascites. Generalized body wall edema could be seen in the setting of anasarca. Electronically Signed   By: Zetta Bills M.D.   On: 02/03/2022 15:45   ? ?Scheduled Meds: ? sodium chloride   Intravenous Once  ? sodium chloride   Intravenous Once  ? feeding supplement  1 Container Oral TID BM  ?  hydrocortisone  15 mg Oral q AM  ? And  ? hydrocortisone  10 mg Oral QPM  ? insulin aspart  0-6 Units Subcutaneous TID WC  ? insulin glargine-yfgn  5 Units Subcutaneous Daily  ? levETIRAcetam  500 mg Oral BID  ? mirtazapine  30 mg Oral QHS  ? multivitamin with minerals  1 tablet Oral Daily  ? pantoprazole  40 mg Oral BID  ? rosuvastatin  5 mg Oral Daily  ? tamsulosin  0.4 mg Oral Daily  ? thiamine  100 mg Oral QHS  ? ?Continuous Infusions: ? cefTRIAXone (ROCEPHIN)  IV 1 g (02/04/22 1325)  ? ?PRN Meds:. ? ? ?ASSESMENT:  ? ?GI bleed, melena.  History of severe esophagitis, gastritis, duodenitis.  Multiple  prior EGDs, negative for H. pylori or malignancy/dysplasia.[name] ?Colonoscopy 12/23/2021: Diverticulosis scattered throughout colon otherwise normal study. ?02/03/2022 EGD with severe, grade D esophagitis but no active bleeding.  Small hiatal hernia. Mild portal hypertensive gastropathy, without bleeding, biopsied.  Duodenal erosion, not bleeding.  Multiple nonbleeding superficial duodenal ulcers with flat pigmented spot in second duodenum, largest 7 mm diameter. ?  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  MELD 27.  Still active alcohol abuse PTA (pt says only recent ETOH was some beer while watching NCAA/march madness tournament in March that O/w not ETOH).  Abdominal ultrasound revealed abdominal ascites, diffuse fatty liver and suspicion for underlying cirrhosis, no suspicious liver lesions.  T. bili, transaminases normal but alk phos elevated and albumin less than 1.5. ?  ?Ascites.  02/02/2022 paracentesis with removal of 1.6 L fluid.  Fluid studies negative for SBP.  Not able to accurately measure SAAG as both fluid albumin and ascitic fluid albumin reported as <1.5.  Outpatient meds include Lasix 40 mg/day, no other diuretics. ?  ?  [place] anemia, chronic.  Hgb 8.1  .[name] 10.. 7.2 .[name] 2 PRBC ordered. Despite drop in Hgb, the BPs are elevated and HR ~ 100.  Patient on oral iron, folic acid, thiamine, multivitamin as outpatient.  No overt signs of GIB despite drop in Hgb.   ?  ?Coagulopathy with INR 1.4. ?  ?NIDDM.  A1c 8.3.  Glucose 541 at admission.  Metformin now discontinued. ?  ?AKI.  Established diagnosis CKD.  GFR has gone from 51 2 weeks ago to 19 this admission.  UA relevant for presence of RBCs and proteinuria as well as glucose.  Urine sodium less than 10.  Diabetes coordinator has discontinued metformin.  Received IV albumin 5/1.   ?  ?  OSA, morbid obesity, on CPAP at home but not getting as inpt.   ? ?  Hyponatremia.  Na 129 >> 134.   ? ?  Hypertension.  Latest BP 180/105 ? ? ?PLAN  ? ?Not clear to me that the  patient needs transfusion at this point but we will honor the hospitalist decision to transfuse 2 units PRBC.  Perhaps give just 1 unit??  ? ?  Pt needs to get back on his CPAP, uses at home but not getting this as inpt.   ? ? ? ?Azucena Freed  02/05/2022, 8:11 AM ?Phone [phone]  ?

## 2022-02-05 NOTE — Progress Notes (Signed)
Inpatient Diabetes Program Recommendations ? ?AACE/ADA: New Consensus Statement on Inpatient Glycemic Control (2015) ? ?Target Ranges:  Prepandial:   less than 140 mg/dL ?     Peak postprandial:   less than 180 mg/dL (1-2 hours) ?     Critically ill patients:  140 - 180 mg/dL  ? ? Latest Reference Range & Units 02/04/22 06:43 02/04/22 12:26 02/04/22 16:48 02/04/22 21:16  ?Glucose-Capillary 70 - 99 mg/dL 301 (H) ? ?4 units Novolog ? 247 (H) ? ?2 units Novolog ? ?5 units Semglee '@1318'$  ? 223 (H) ? ?2 units Novolog ?'@1830'$  294 (H)  ?(H): Data is abnormally high ? Latest Reference Range & Units 02/05/22 05:47  ?Glucose-Capillary 70 - 99 mg/dL 234 (H)  ?(H): Data is abnormally high ? ? ? ? ?Home DM Meds: Metformin 500 mg BID ?    Trulicity 7.12 mg weekly ? ?Current Orders: Semglee 5 units Daily ?     Novolog 0-6 units TID ? ? ? ? ?MD- Based on current CBGs and Steroid Admin, please consider: ? ?1. Increase Semglee to 10 units Daily (~0.1 units/kg) ? ?2. Increase Novolog SSI to the 0-9 unit Sensitive scale ? ?If GFR and Creatinine remain elevated when pt discharged, will likely need to d/c Metformin from home meds ? ? ? ?--Will follow patient during hospitalization-- ? ?[name] RN, MSN, CDE ?Diabetes Coordinator ?Inpatient Glycemic Control Team ?Team Pager: [phone] (8a-5p) ? ?

## 2022-02-05 NOTE — Progress Notes (Addendum)
FPTS Brief Progress Note ? ?S:Went to bedside to check on patient, patient sleeping comfortably, did not disturb.  ? ? ?O: ?BP (!) 152/93 (BP Location: Left Arm)   Pulse 95   Temp 97.8 ?F (36.6 ?C) (Oral)   Resp 14   Ht '5\' 10"'$  (1.778 m)   Wt 119.4 kg   SpO2 97%   BMI 37.77 kg/m?   ?Bladder scan reported by nurse tech: ~70 ? ?A/P: ?- Plans per day team ?- Orders reviewed. Labs for AM ordered, which was adjusted as needed.  ? Holley Bouche, MD ?02/05/2022, 1:48 AM ?PGY-1, [place] [facility] Night Resident  ?Please page [phone] with questions.  ? ? ?

## 2022-02-05 NOTE — Progress Notes (Signed)
Occupational Therapy Treatment ?Patient Details ?Name: Jeffrey [name] ?MRN: [id number] ?DOB: Dec 16, 1965 ?Today's Date: 02/05/2022 ? ? ?History of present illness Pt is a 56 y.o. male who presented 02/02/22 with weakness, black stools, and abdominal pain. EGD 4/30. Pt admitted with GI bleed. PMH: HTN, seizure disorder, DM2, hx of ETOH abuse, depression, hx of ABI, erosive esophagitis, cardiac arrest with anoxic brain injury, HLD ?  ?OT comments ? Rn cleared pt for participation in OT tx session. Tx session targeted progressing functional endurance and activity tolerance for improved safe independence during ADL tasks. Progress noted as evidenced by tolerating standi gnat sink for approx 15 minutes for grooming tasks without rest breaks required. Pt amb in room with RW with CGA-close sup. Increased time required for processing required on this date. Pt BP at completion of session 170/117 RN aware. Pt is left in chair, NAD, all needs met. Discharge recommendation remains appropriate, OT will continue to follow acutely.   ? ?Recommendations for follow up therapy are one component of a multi-disciplinary discharge planning process, led by the attending physician.  Recommendations may be updated based on patient status, additional functional criteria and insurance authorization. ?   ?Follow Up Recommendations ? Home health OT  ?  ?Assistance Recommended at Discharge Intermittent Supervision/Assistance  ?Patient can return home with the following ? Assistance with cooking/housework;Assist for transportation ?  ?Equipment Recommendations ? None recommended by OT  ?  ?Recommendations for Other Services   ? ?  ?Precautions / Restrictions Precautions ?Precautions: Fall;Other (comment) ?Precaution Comments: watch BP (hx of orthostatic hypotension and falls) ?Restrictions ?Weight Bearing Restrictions: No  ? ? ?  ? ?Mobility Bed Mobility ?Overal bed mobility: Needs Assistance ?Bed Mobility: Supine to Sit ?  ?  ?Supine to sit:  Modified independent (Device/Increase time), HOB elevated ?  ?  ?  ?  ? ?Transfers ?  ?Equipment used: Rolling walker (2 wheels) ?Transfers: Sit to/from Stand ?Sit to Stand: Min guard, From elevated surface ?  ?  ?  ?  ?  ?General transfer comment: simulated home set up ?  ?  ?Balance Overall balance assessment: Needs assistance ?Sitting-balance support: Feet supported, No upper extremity supported ?Sitting balance-Leahy Scale: Good ?  ?  ?Standing balance support: During functional activity, Reliant on assistive device for balance, Bilateral upper extremity supported ?Standing balance-Leahy Scale: Fair ?  ?  ?  ?  ?  ?  ?  ?  ?  ?  ?  ?  ?   ? ?ADL either performed or assessed with clinical judgement  ? ?ADL Overall ADL's : Needs assistance/impaired ?  ?  ?Grooming: Oral care;Standing;Supervision/safety;Min guard ?Grooming Details (indicate cue type and reason): sink level with RW approx 10 minutes no rest breaks required ?  ?Upper Body Bathing Details (indicate cue type and reason): pt declined ?  ?Lower Body Bathing Details (indicate cue type and reason): pt declined ?  ?Upper Body Dressing Details (indicate cue type and reason): pt declined ?  ?Lower Body Dressing Details (indicate cue type and reason): pt declined ?Toilet Transfer: Supervision/safety;Cueing for safety;Ambulation;Rolling walker (2 wheels) ?Toilet Transfer Details (indicate cue type and reason): simulated to bedside chair, intermittent vcs for technique ?  ?  ?  ?  ?Functional mobility during ADLs: Supervision/safety;Min guard;Rolling walker (2 wheels) (within room) ?  ?  ? ?Extremity/Trunk Assessment   ?  ?  ?  ?  ?  ? ?Vision   ?  ?  ?Perception   ?  ?Praxis   ?  ? ?  Cognition Arousal/Alertness: Awake/alert ?Behavior During Therapy: Flat affect, WFL for tasks assessed/performed ?Overall Cognitive Status: History of cognitive impairments - at baseline ?  ?  ?  ?  ?  ?  ?  ?  ?  ?  ?  ?  ?  ?  ?  ?  ?General Comments: alert and oriented x4, slow  processing noted with increased time for processing of directions required, good one step direction following with increased time ?  ?  ?   ?Exercises Other Exercises ?Other Exercises: edu re: importance of oob mobility, positioning ? ?  ?Shoulder Instructions   ? ? ?  ?General Comments BP after activity 170/114 RN aware; all other vss  ? ? ?Pertinent Vitals/ Pain       Pain Assessment ?Pain Assessment: No/denies pain ? ?Home Living   ?  ?  ?  ?  ?  ?  ?  ?  ?  ?  ?  ?  ?  ?  ?  ?  ?  ?  ? ?  ?Prior Functioning/Environment    ?  ?  ?  ?   ? ?Frequency ? Min 2X/week  ? ? ? ? ?  ?Progress Toward Goals ? ?OT Goals(current goals can now be found in the care plan section) ? Progress towards OT goals: Progressing toward goals ? ?Acute Rehab OT Goals ?Patient Stated Goal: go home ?OT Goal Formulation: With patient ?Time For Goal Achievement: 02/19/22 ?Potential to Achieve Goals: Good  ?Plan Discharge plan remains appropriate;Frequency remains appropriate   ? ?Co-evaluation ? ? ?   ?  ?  ?  ?  ? ?  ?AM-PAC OT "6 Clicks" Daily Activity     ?Outcome Measure ? ? Help from another person eating meals?: None ?Help from another person taking care of personal grooming?: None ?Help from another person toileting, which includes using toliet, bedpan, or urinal?: A Little ?Help from another person bathing (including washing, rinsing, drying)?: A Little ?Help from another person to put on and taking off regular upper body clothing?: A Little ?Help from another person to put on and taking off regular lower body clothing?: A Little ?6 Click Score: 20 ? ?  ?End of Session Equipment Utilized During Treatment: Gait belt;Rolling walker (2 wheels) ? ?  ?  ?Activity Tolerance Patient tolerated treatment well ?  ?Patient Left in chair;with call bell/phone within reach;with chair alarm set ?  ?Nurse Communication Mobility status ?  ? ?   ? ?Time: 1991-4445 ?OT Time Calculation (min): 36 min ? ?Charges: OT General Charges ?$OT Visit: 1 Visit ?OT  Treatments ?$Self Care/Home Management : 23-37 mins ? ?Shanon Payor, OTD OTR/L  ?02/05/22, 11:55 AM  ?

## 2022-02-05 NOTE — Progress Notes (Signed)
Pt refused CPAP for tonight.  

## 2022-02-05 NOTE — Consult Note (Signed)
[facility] Consult Note ?Reason for Consult: AKI ?Referring Physician: Domenic Schwab, MD ? ?Jeffrey [name] is a 56 y.o. male with history of alcoholic cirrhosis, E1DE, CKD IIIa, adrenal insufficiency, PEA arrest with anoxic brain injury, presenting with melena concerning for GI bleed.  Hospital course complicated by AKI superimposed on CKD.  We were asked to consult. ? ?He had an EGD performed this hospital admission on 4/30 which showed severe esophagitis and multiple nonbleeding superficial duodenal ulcers but no active bleeding.  1 unit of PRBC ordered today. ? ?Today, patient states that he has had no further episodes of dark stools.  He did not eat much breakfast this morning due to having visitors and food cold.  He reports swelling primarily in his lower extremities ongoing for the past year, some swelling in his arms and face as well.  He has noticed hematuria on and off but none recently.  He reports his last bowel movement was 3 days ago. ? ?Past Medical History:  ?Diagnosis Date  ? Acute renal insufficiency 12/08/2012  ? AKI (acute kidney injury) ([place])   ? ALCOHOL ABUSE, HX OF 11/10/2007  ? Anoxic encephalopathy ([place])   ? Anxiety   ? Bleeding ulcer 2014  ? Blood transfusion 2014  ? "related to bleeding ulcer"  ? Cardiac arrest ([place]) 12/07/2012  ? Anoxic encephalopathy  ? GERD (gastroesophageal reflux disease)   ? High cholesterol   ? Hypertension   ? OSA on CPAP 12/07/2012  ? Seizures ([place])   ? last seizure 01/05/2021  ? Type II diabetes mellitus ([place])   ? Venous insufficiency 12/13/2011  ? ? ?Past Surgical History:  ?Procedure Laterality Date  ? BIOPSY  11/01/2020  ? Procedure: BIOPSY;  Surgeon: Irene Shipper, MD;  Location: [facility] ENDOSCOPY;  Service: Endoscopy;;  ? BIOPSY  02/03/2022  ? Procedure: BIOPSY;  Surgeon: Ladene Artist, MD;  Location: [place];  Service: Gastroenterology;;  ? CARDIOVASCULAR STRESS TEST  03/16/13  ? Very Poor Exercise Tolerance; NON DIAGNOSTIC TEST  ? CATARACT EXTRACTION W/  INTRAOCULAR LENS IMPLANT Left 03/07/2014  ? [place] @ [facility]  ? COLONOSCOPY WITH PROPOFOL N/A 12/23/2021  ? Procedure: COLONOSCOPY WITH PROPOFOL;  Surgeon: Irene Shipper, MD;  Location: [place];  Service: Gastroenterology;  Laterality: N/A;  ? ESOPHAGOGASTRODUODENOSCOPY Left 11/26/2012  ? Procedure: ESOPHAGOGASTRODUODENOSCOPY (EGD);  Surgeon: Wonda Horner, MD;  Location[facility] ENDOSCOPY;  Service: Endoscopy;  Laterality: Left;  ? ESOPHAGOGASTRODUODENOSCOPY N/A 10/10/2014  ? Procedure: ESOPHAGOGASTRODUODENOSCOPY (EGD);  Surgeon: Ladene Artist, MD;  [facility] ENDOSCOPY;  Service: Endoscopy;  Laterality: N/A;  ? ESOPHAGOGASTRODUODENOSCOPY (EGD) WITH PROPOFOL N/A 11/01/2020  ? Procedure: ESOPHAGOGASTRODUODENOSCOPY (EGD) WITH PROPOFOL;  Surgeon: Irene Shipper, MD;  Location: [facility] ENDOSCOPY;  Service: Endoscopy;  Laterality: N/A;  ? ESOPHAGOGASTRODUODENOSCOPY (EGD) WITH PROPOFOL N/A 12/22/2021  ? Procedure: ESOPHAGOGASTRODUODENOSCOPY (EGD) WITH PROPOFOL;  Surgeon: Mauri Pole, MD;  Location: [place];  Service: Gastroenterology;  Laterality: N/A;  ? ESOPHAGOGASTRODUODENOSCOPY (EGD) WITH PROPOFOL N/A 02/03/2022  ? Procedure: ESOPHAGOGASTRODUODENOSCOPY (EGD) WITH PROPOFOL;  Surgeon: Ladene Artist, MD;  Location: [place];  Service: Gastroenterology;  Laterality: N/A;  ? KNEE ARTHROSCOPY Left ~ 1999  ? UPPER GASTROINTESTINAL ENDOSCOPY    ? ? ?Family History  ?Problem Relation Age of Onset  ? Other Mother   ?     Unsure of medical history.  ? Diabetes Mellitus II Father   ? Colon polyps Neg Hx   ? Colon cancer Neg Hx   ?  Esophageal cancer Neg Hx   ? Rectal cancer Neg Hx   ? Stomach cancer Neg Hx   ? ? ?Social History:  reports that he has never smoked. His smokeless tobacco use includes snuff. He reports current alcohol use. He reports that he does not use drugs. ? ?Allergies:  ?Allergies  ?Allergen Reactions  ? Atorvastatin Other (See Comments)  ?  Cause Muscle Weakness  ? Lisinopril Other (See  Comments)  ?  Pt reports nose bleeds  ? Losartan Other (See Comments)  ?  Patient stopped taking this because he felt like passing out after standing up  ? Drug Ingredient [Spironolactone] Other (See Comments)  ?  Hyperkalemia  ? ? ?Medications: I have reviewed the patient's current medications. ? ?Results for orders placed or performed during the hospital encounter of 02/02/22 (from the past 48 hour(s))  ?Glucose, capillary     Status: Abnormal  ? Collection Time: 02/03/22 12:13 PM  ?Result Value Ref Range  ? Glucose-Capillary 219 (H) 70 - 99 mg/dL  ?  Comment: Glucose reference range applies only to samples taken after fasting for at least 8 hours.  ? Comment 1 Notify RN   ? Comment 2 Document in Chart   ?Glucose, capillary     Status: Abnormal  ? Collection Time: 02/03/22  4:58 PM  ?Result Value Ref Range  ? Glucose-Capillary 258 (H) 70 - 99 mg/dL  ?  Comment: Glucose reference range applies only to samples taken after fasting for at least 8 hours.  ? Comment 1 Notify RN   ? Comment 2 Document in Chart   ?Comprehensive metabolic panel     Status: Abnormal  ? Collection Time: 02/04/22  3:16 AM  ?Result Value Ref Range  ? Sodium 131 (L) 135 - 145 mmol/L  ? Potassium 4.8 3.5 - 5.1 mmol/L  ? Chloride 101 98 - 111 mmol/L  ? CO2 22 22 - 32 mmol/L  ? Glucose, Bld 339 (H) 70 - 99 mg/dL  ?  Comment: Glucose reference range applies only to samples taken after fasting for at least 8 hours.  ? BUN 29 (H) 6 - 20 mg/dL  ? Creatinine, Ser 3.58 (H) 0.61 - 1.24 mg/dL  ? Calcium 7.6 (L) 8.9 - 10.3 mg/dL  ? Total Protein 6.1 (L) 6.5 - 8.1 g/dL  ? Albumin <1.5 (L) 3.5 - 5.0 g/dL  ? AST 23 15 - 41 U/L  ? ALT 28 0 - 44 U/L  ? Alkaline Phosphatase 225 (H) 38 - 126 U/L  ? Total Bilirubin 0.3 0.3 - 1.2 mg/dL  ? GFR, Estimated 19 (L) >60 mL/min  ?  Comment: (NOTE) ?Calculated using the CKD-EPI Creatinine Equation (2021) ?  ? Anion gap 8 5 - 15  ?  Comment: Performed at [facility], [place] [address] [place], [place] [address]  ?CBC      Status: Abnormal  ? Collection Time: 02/04/22  3:16 AM  ?Result Value Ref Range  ? WBC 10.7 (H) 4.0 - 10.5 K/uL  ? RBC 3.70 (L) 4.22 - 5.81 MIL/uL  ? Hemoglobin 10.0 (L) 13.0 - 17.0 g/dL  ? HCT 31.2 (L) 39.0 - 52.0 %  ? MCV 84.3 80.0 - 100.0 fL  ? MCH 27.0 26.0 - 34.0 pg  ? MCHC 32.1 30.0 - 36.0 g/dL  ? RDW 22.5 (H) 11.5 - 15.5 %  ? Platelets 507 (H) 150 - 400 K/uL  ? nRBC 0.0 0.0 - 0.2 %  ?  Comment:  Performed at [facility], [place] [address], [place], [place] [address]  ?Glucose, capillary     Status: Abnormal  ? Collection Time: 02/04/22  6:43 AM  ?Result Value Ref Range  ? Glucose-Capillary 301 (H) 70 - 99 mg/dL  ?  Comment: Glucose reference range applies only to samples taken after fasting for at least 8 hours.  ?Glucose, capillary     Status: Abnormal  ? Collection Time: 02/04/22 12:26 PM  ?Result Value Ref Range  ? Glucose-Capillary 247 (H) 70 - 99 mg/dL  ?  Comment: Glucose reference range applies only to samples taken after fasting for at least 8 hours.  ?Glucose, capillary     Status: Abnormal  ? Collection Time: 02/04/22  4:48 PM  ?Result Value Ref Range  ? Glucose-Capillary 223 (H) 70 - 99 mg/dL  ?  Comment: Glucose reference range applies only to samples taken after fasting for at least 8 hours.  ?Glucose, capillary     Status: Abnormal  ? Collection Time: 02/04/22  9:16 PM  ?Result Value Ref Range  ? Glucose-Capillary 294 (H) 70 - 99 mg/dL  ?  Comment: Glucose reference range applies only to samples taken after fasting for at least 8 hours.  ?Basic metabolic panel     Status: Abnormal  ? Collection Time: 02/05/22  1:45 AM  ?Result Value Ref Range  ? Sodium 134 (L) 135 - 145 mmol/L  ? Potassium 4.6 3.5 - 5.1 mmol/L  ? Chloride 105 98 - 111 mmol/L  ? CO2 21 (L) 22 - 32 mmol/L  ? Glucose, Bld 264 (H) 70 - 99 mg/dL  ?  Comment: Glucose reference range applies only to samples taken after fasting for at least 8 hours.  ? BUN 30 (H) 6 - 20 mg/dL  ? Creatinine, Ser 3.63 (H) 0.61 - 1.24 mg/dL  ? Calcium  7.7 (L) 8.9 - 10.3 mg/dL  ? GFR, Estimated 19 (L) >60 mL/min  ?  Comment: (NOTE) ?Calculated using the CKD-EPI Creatinine Equation (2021) ?  ? Anion gap 8 5 - 15  ?  Comment: Performed at [facility]

## 2022-02-05 NOTE — Progress Notes (Addendum)
Family Medicine Teaching Service ?Daily Progress Note ?Intern Pager: [phone] ? ?Patient name: Jeffrey [name] Medical record number: [id number] ?Date of birth: [birth year] Age: 56 y.o. Gender: male ? ?Primary Care Provider: [name], MD ?Consultants: GI, Nephrology ?Code Status: Full ? ?Pt Overview and Major Events to Date:  ?4/29: Admitted ?4/30: EGD showed LA grade D esophagitis ?5/2: 2u pRBC transfused ? ?Assessment and Plan: ?Jeffrey [name] is a 56 year old male admitted for melena, abdominal distention, and AKI; concern for GI bleed, and worsening alcoholic cirrhosis. PMH is significant for alcoholic cirrhosis, history of heavy etOH use, hx of erosive esophagitis, T2DM, C-difficile infection, cardiac arrest with anoxic brain injury, seizure disorder, depression, HLD ? ?Erosive esophagitis  Black stools, now resolved  Anemia likely 2/2 chronic disease and potential GI blood loss ?Hgb dropped from 10.0 to 7.2, with repeat stat H/H 7.3 this morning. Concern for GI bleed d/t erosive esophagitis, but also unsure of accuracy of Hgb 10.0 the other tday given baseline 8-9. No overt signs of bleeding (frank hematuria, hematemesis, etc), and pt is asymptomatic. Will transfuse 1u pRBC and requested GI to re-evaluate patient this morning. ?- Transfuse 1u pRBC ?- Post-transfusion H/H ?- Continue PO Famotidine '40mg'$  BID ?- Avoid NSAIDs ?- PT/OT ?- Daily CBC ? ?BLLE/UE edema  ETOH cirrhosis  Hypoalbuminemia ?On day 4 SBP ppx with CTX given advanced cirrhosis (Child [place] class B) and recent GI bleeding (for total of 7 days), will complete for 7 days. Paracentesis labs negative for SBP (culture negative at 2 days) ?- SBP ppx w/ Rocephin 1g q24h ?- RD reccs for nutrition support (Boost Breeze TID) ? ?Worsening AKI on CKD, likely pre-renal ?Cr elevated further today to 3.63. Have requested nephrology consultation.  Bladder scans all < 300, patient has improved urinary hesitancy on Flomax. 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 ?-  Daily CMP ?- Continue Flomax ? ?Uncontrolled T2DM ?Glucose ranges 200-300. Started Lantus 5u daily yesterday. ?- Continue very sensitive SSI ?- Increase Lantus to 10u daily ? ?Secondary adrenal insufficiency ?Will decrease hydrocortisone to '10mg'$  am and '5mg'$  pm ?- O/p endo f/u at dc ? ?HTN ?BP elevated, and given cirrhosis with likely portal hypertension, needs treatment with an antihypertensive ?- Start Propranolol '20mg'$  TID ? ?OSA ?- CPAP ordered ? ?Other conditions chronic and stable ?HLD, Seizure disorder, Hx PEA arrest with anoxic brain injury ? ?FEN/GI: Regular diet ?PPx: SCDs, no pharmacologic VTE ppx given high bleeding risk ?Dispo:Pending continued workup and clinical stability ? ?Subjective:  ?Jeffrey [name] was eating breakfast this morning and feeling okay. He denied any pain anywhere. No dizziness or lightheadedness. He was able to walk the halls without difficulty yesterday. He denies blood in urine or stool. He says he will "get through this." ? ?Objective: ?Temp:  [97.6 ?F (36.4 ?C)-97.8 ?F (36.6 ?C)] 97.7 ?F (36.5 ?C) (05/02 9767) ?Pulse Rate:  [95-105] 100 (05/02 0717) ?Resp:  [14-20] 16 (05/02 0717) ?BP: (143-180)/(93-105) 180/105 (05/02 0717) ?SpO2:  [97 %-100 %] 98 % (05/02 0717) ?Weight:  [121.6 kg] 121.6 kg (05/02 0500) ?Physical Exam: ?General: Awake, alert, no distress, eating breakfast ?Cardiovascular: RRR (HR 90s bpm) no murmurs ?Respiratory: CTAB, normal work of breathing on room air. SpO2 100% ?Abdomen: Soft, NTND, BSx4 ?Extremities: Warm, well-perfused ? ?Laboratory: ?Recent Labs  ?Lab 02/03/22 ?0421 02/04/22 ?0316 02/05/22 ?0145 02/05/22 ?3419  ?WBC 9.5 10.7* 7.4  --   ?HGB 8.1* 10.0* 7.2* 7.3*  ?HCT 24.9* 31.2* 22.1* 22.5*  ?PLT 464* 507* 379  --   ? ?Recent Labs  ?  Lab 02/02/22 ?1610 02/03/22 ?0421 02/04/22 ?9604 02/05/22 ?0145  ?NA 129* 131* 131* 134*  ?K 4.4 4.7 4.8 4.6  ?CL 102 103 101 105  ?CO2 15* 20* 22 21*  ?BUN 28* 26* 29* 30*  ?CREATININE 3.59* 3.57* 3.58* 3.63*  ?CALCIUM 6.9* 7.2*  7.6* 7.7*  ?PROT 4.8* 5.2* 6.1*  --   ?BILITOT 0.1* 0.5 0.3  --   ?ALKPHOS 165* 162* 225*  --   ?ALT '24 27 28  '$ --   ?AST '26 26 23  '$ --   ?GLUCOSE 541* 217* 339* 264*  ? ? ? ? ?Imaging/Diagnostic Tests: ?No results found. ? ? ?Jeffrey Brill, DO ?02/05/2022, 8:22 AM ?PGY-1, [place] ?[place] Intern pager: [phone], text pages welcome ? ?

## 2022-02-06 ENCOUNTER — Telehealth: Payer: Self-pay

## 2022-02-06 DIAGNOSIS — K2101 Gastro-esophageal reflux disease with esophagitis, with bleeding: Principal | ICD-10-CM

## 2022-02-06 LAB — TYPE AND SCREEN
ABO/RH(D): O POS
Antibody Screen: NEGATIVE
Unit division: 0
Unit division: 0

## 2022-02-06 LAB — BPAM RBC
Blood Product Expiration Date: 202305312359
Blood Product Expiration Date: 202306012359
ISSUE DATE / TIME: 202305021402
Unit Type and Rh: 5100
Unit Type and Rh: 5100

## 2022-02-06 LAB — COMPREHENSIVE METABOLIC PANEL
ALT: 18 U/L (ref 0–44)
AST: 21 U/L (ref 15–41)
Albumin: 1.9 g/dL — ABNORMAL LOW (ref 3.5–5.0)
Alkaline Phosphatase: 138 U/L — ABNORMAL HIGH (ref 38–126)
Anion gap: 10 (ref 5–15)
BUN: 34 mg/dL — ABNORMAL HIGH (ref 6–20)
CO2: 19 mmol/L — ABNORMAL LOW (ref 22–32)
Calcium: 7.7 mg/dL — ABNORMAL LOW (ref 8.9–10.3)
Chloride: 104 mmol/L (ref 98–111)
Creatinine, Ser: 3.58 mg/dL — ABNORMAL HIGH (ref 0.61–1.24)
GFR, Estimated: 19 mL/min — ABNORMAL LOW (ref >60)
Glucose, Bld: 271 mg/dL — ABNORMAL HIGH (ref 70–99)
Potassium: 4.6 mmol/L (ref 3.5–5.1)
Sodium: 133 mmol/L — ABNORMAL LOW (ref 135–145)
Total Bilirubin: 0.4 mg/dL (ref 0.3–1.2)
Total Protein: 5.3 g/dL — ABNORMAL LOW (ref 6.5–8.1)

## 2022-02-06 LAB — CBC
HCT: 27.2 % — ABNORMAL LOW (ref 39.0–52.0)
Hemoglobin: 8.8 g/dL — ABNORMAL LOW (ref 13.0–17.0)
MCH: 27.7 pg (ref 26.0–34.0)
MCHC: 32.4 g/dL (ref 30.0–36.0)
MCV: 85.5 fL (ref 80.0–100.0)
Platelets: 352 10*3/uL (ref 150–400)
RBC: 3.18 MIL/uL — ABNORMAL LOW (ref 4.22–5.81)
RDW: 19.7 % — ABNORMAL HIGH (ref 11.5–15.5)
WBC: 7.3 10*3/uL (ref 4.0–10.5)
nRBC: 0.3 % — ABNORMAL HIGH (ref 0.0–0.2)

## 2022-02-06 LAB — GLUCOSE, CAPILLARY
Glucose-Capillary: 193 mg/dL — ABNORMAL HIGH (ref 70–99)
Glucose-Capillary: 189 mg/dL — ABNORMAL HIGH (ref 70–99)
Glucose-Capillary: 135 mg/dL — ABNORMAL HIGH (ref 70–99)
Glucose-Capillary: 168 mg/dL — ABNORMAL HIGH (ref 70–99)

## 2022-02-06 MED ORDER — INSULIN ASPART 100 UNIT/ML IJ SOLN
0.0000 [IU] | Freq: Three times a day (TID) | INTRAMUSCULAR | Status: DC
  Administered 2022-02-06 – 2022-02-07 (×4): 2 [IU] via SUBCUTANEOUS
  Administered 2022-02-08: 1 [IU] via SUBCUTANEOUS

## 2022-02-06 MED ORDER — INSULIN GLARGINE-YFGN 100 UNIT/ML ~~LOC~~ SOLN
10.0000 [IU] | Freq: Every day | SUBCUTANEOUS | Status: DC
  Administered 2022-02-07 – 2022-02-08 (×2): 10 [IU] via SUBCUTANEOUS
  Filled 2022-02-06 (×3): qty 0.1

## 2022-02-06 MED ORDER — PROPRANOLOL HCL 40 MG PO TABS
40.0000 mg | ORAL_TABLET | Freq: Three times a day (TID) | ORAL | Status: DC
Start: 2022-02-06 — End: 2022-02-12
  Administered 2022-02-06 – 2022-02-11 (×17): 40 mg via ORAL
  Filled 2022-02-06 (×17): qty 1

## 2022-02-06 NOTE — Progress Notes (Addendum)
Family Medicine Teaching Service ?Daily Progress Note ?Intern Pager: [phone] ? ?Patient name: Jeffrey [name] Medical record number: [id number] ?Date of birth: 1966/01/17 Age: 56 y.o. Gender: male ? ?Primary Care Provider: [name], MD ?Consultants: GI, Nephrology ?Code Status: Full ? ?Pt Overview and Major Events to Date:  ?4/29: Admitted ?4/30: EGD showed LA grade esophagitis ?5/2: 1u pRBC transfused ? ?Assessment and Plan: ?Jeffrey [name] is a 56 year old male admitted for melena, abdominal distention, and AKI; concern for GI bleed in the setting of alcoholic cirrhosis. PMH is significant for alcoholic cirrhosis, history of heavy etOH use, hx of erosive esophagitis, T2DM, C-difficile infection, cardiac arrest with anoxic brain injury, seizure disorder, depression, HLD ? ?Concern for GI bleed w/melena/anemia, erosive esophagitis  ETOH cirrhosis ?Improved. No further concern for bleed. Had BM yesterday and today- no blood in stool. S/p 1uPRBC yesterday for Hgb 7.2, improved to 8.8 today. ?- GI signed off, appreciate care ?- Continue PO Protonix '40mg'$  BID indefinitely ?- Will have o/p follow up with GI for repeat EGD in a few months to ensure mucosal healing ?- IV CTX x7 days for SBP ppx (start date: 4/30) ? ?AKI on CKD; likely HRS component  ?Nephrology recommended IV albumin x2 days (5/2-5/3) to r/o hemodynamically mediated AKI vs. Hepatorenal syndrome. Extremity edema seems ever so slightly improved on exam today. May pursue furosemide challenge tomorrow if refractory. ?- Continue tamsulosin 0.'4mg'$  daily ?- Consider diuresis if kidney function not improving ?- Strict I/O ?- Nephrology following, appreciate care ? ? ?Uncontrolled T2DM ?CBG 100-200s ?- CBG 4 times daily, at meals and bedtime ?- Sensitive SSI ?- Lantus '10mg'$  daily ? ?HTN ?BP still persistently elevated in 150s-170s/90s-100s. Started on Propranolol '20mg'$  TID yesterday. ?- Increase propranolol to propranolol '40mg'$  TID ? ? ?FEN/GI: Regular ?PPx: SCD's,  no pharmacologic VTE ppx d/t high bleeding risk ?Dispo:Pending PT recommendations  pending clinical improvement . Barriers include further inpatient stabilization with IV medication, workup.  ? ?Subjective:  ?Feeling well this morning. Denies any complaints. No dizziness, chest pain, abdominal pain. No blood in stool. Had 2 normal BM last night and today. Eating and drinking well. Would like to go home at discharge. ? ?Objective: ?Temp:  [97.4 ?F (36.3 ?C)-97.8 ?F (36.6 ?C)] 97.8 ?F (36.6 ?C) (05/03 0805) ?Pulse Rate:  [61-91] 67 (05/03 0835) ?Resp:  [15-20] 18 (05/03 0805) ?BP: (154-187)/(98-122) 174/98 (05/03 0835) ?SpO2:  [96 %-99 %] 98 % (05/03 0805) ?Weight:  [120.6 kg] 120.6 kg (05/03 0622) ?Physical Exam: ?General: Awake, alert, no distress, laying in bed comofrtably ?Cardiovascular: RRR, no murmurs ?Respiratory: CTAB, normal work of breathing on room air. SpO2 100% ?Abdomen: Soft, NTND, BS x4 ?Extremities: Warm, well-perfused.  ? ?Laboratory: ?Recent Labs  ?Lab 02/04/22 ?0316 02/05/22 ?0145 02/05/22 ?3704 02/05/22 ?1947 02/06/22 ?8889  ?WBC 10.7* 7.4  --   --  7.3  ?HGB 10.0* 7.2* 7.3* 8.2* 8.8*  ?HCT 31.2* 22.1* 22.5* 25.2* 27.2*  ?PLT 507* 379  --   --  352  ? ?Recent Labs  ?Lab 02/03/22 ?0421 02/04/22 ?0316 02/05/22 ?0145 02/06/22 ?1694  ?NA 131* 131* 134* 133*  ?K 4.7 4.8 4.6 4.6  ?CL 103 101 105 104  ?CO2 20* 22 21* 19*  ?BUN 26* 29* 30* 34*  ?CREATININE 3.57* 3.58* 3.63* 3.58*  ?CALCIUM 7.2* 7.6* 7.7* 7.7*  ?PROT 5.2* 6.1*  --  5.3*  ?BILITOT 0.5 0.3  --  0.4  ?ALKPHOS 162* 225*  --  138*  ?ALT 27 28  --  18  ?  AST 26 23  --  21  ?GLUCOSE 217* 339* 264* 271*  ? ? ? ?Imaging/Diagnostic Tests: ?No results found. ? ? ?Orvis Brill, DO ?02/06/2022, 9:18 AM ?PGY-1, [place] ?[place] Intern pager: [phone], text pages welcome ? ?

## 2022-02-06 NOTE — Progress Notes (Signed)
?[name] ?Progress Note  ? ? ?Assessment/ Plan:   ?Jeffrey [name] is a 56 y.o. male with history of alcoholic cirrhosis, K5LD, CKD IIIa, adrenal insufficiency, PEA arrest with anoxic brain injury, presenting with melena concerning for GI bleed.  Hospital course complicated by AKI superimposed on CKD. ? ?1.  Oliguric AKI superimposed on CKD IIIa - Suspected HRS, ATN is a possibility. Cannot definitively rule out intrinsic renal pathology given proteinuria and hematuria. Cr remains stable without significant improvement on albumin. Urine output is improving. ?- IV albumin trial day 3/3 ?- consider diuresis trial if no significant improvement in kidney function in the next 24 hours ?- may need renal biopsy at some point, but not during this hospitalization ?2.  Nephrotic range proteinuria - AKI is likely contributing, suspect also related to underlying diabetic kidney disease.  ?- check SPEP, kappa/lambda light chains for myeloma/amyloidosis (though lower suspicion) ?3.  Hypoalbuminemia - significant 3rd spacing on exam, likely secondary to underlying cirrhosis but concomitant nephrotic syndrome is a possibility ?4.  Urinary retention - No obstruction seen on renal US. Improving with tamsulosin, bladder scans appropriate. ?5.  Cirrhosis with ascites - s/p low volume paracentesis 4/29, per primary ?6.  HTN - remains elevated on propranolol, decreasing corticosteroids per primary ?- consider starting low dose CCB (given supine hypertension), will defer to primary given history of orthostatic hypotension ?7.  T2DM - per primary ?8.  Adrenal insufficiency ? ?Remainder per primary ? ?Subjective:   ?Feels difficulty with urination has been improving with medication.  Still struggles with poor appetite. ? ?Most recent bladder scans 233, 202, 85.  Urine output of 0.4 cc/kg/h with 1 unmeasured void.  Anemia stable overnight.  ? ?Objective:   ?BP (!) 174/98   Pulse 67   Temp 97.8 ?F (36.6 ?C) (Oral)    Resp 18   Ht '5\' 10"'$  (1.778 m)   Wt 120.6 kg   SpO2 98%   BMI 38.15 kg/m?  ? ?Intake/Output Summary (Last 24 hours) at 02/06/2022 0932 ?Last data filed at 02/06/2022 0850 ?Gross per 24 hour  ?Intake 1802.67 ml  ?Output 1052 ml  ?Net 750.67 ml  ? ?Weight change: -1 kg ? ?Physical Exam: ?Gen: Alert, laying in bed comfortably, NAD ?CVS: RRR, no murmurs ?Resp: Clear to auscultation bilaterally ?Abd: Soft, nontender ?Ext: 2-3+ pitting edema bilateral lower extremities up to the proximal thighs, 1+ pitting edema in bilateral forearms ? ?Imaging: ?No results found. ? ?Labs: ?BMET ?Recent Labs  ?Lab 02/02/22 ?3570 02/03/22 ?0421 02/04/22 ?1779 02/05/22 ?0145 02/06/22 ?3903  ?NA 129* 131* 131* 134* 133*  ?K 4.4 4.7 4.8 4.6 4.6  ?CL 102 103 101 105 104  ?CO2 15* 20* 22 21* 19*  ?GLUCOSE 541* 217* 339* 264* 271*  ?BUN 28* 26* 29* 30* 34*  ?CREATININE 3.59* 3.57* 3.58* 3.63* 3.58*  ?CALCIUM 6.9* 7.2* 7.6* 7.7* 7.7*  ? ?CBC ?Recent Labs  ?Lab 02/02/22 ?0092 02/03/22 ?0421 02/04/22 ?3300 02/05/22 ?0145 02/05/22 ?7622 02/05/22 ?1947 02/06/22 ?6333  ?WBC 9.4 9.5 10.7* 7.4  --   --  7.3  ?NEUTROABS 6.2  --   --   --   --   --   --   ?HGB 8.1* 8.1* 10.0* 7.2* 7.3* 8.2* 8.8*  ?HCT 24.9* 24.9* 31.2* 22.1* 22.5* 25.2* 27.2*  ?MCV 85.6 83.3 84.3 84.4  --   --  85.5  ?PLT 378 464* 507* 379  --   --  352  ? ? ?Medications:   ? ?  sodium chloride   Intravenous Once  ? feeding supplement  1 Container Oral TID BM  ? hydrocortisone  10 mg Oral q AM  ? And  ? hydrocortisone  5 mg Oral QPM  ? levETIRAcetam  500 mg Oral BID  ? mirtazapine  30 mg Oral QHS  ? multivitamin with minerals  1 tablet Oral Daily  ? pantoprazole  40 mg Oral BID  ? polyethylene glycol  17 g Oral Daily  ? propranolol  20 mg Oral TID  ? rosuvastatin  5 mg Oral Daily  ? tamsulosin  0.4 mg Oral Daily  ? thiamine  100 mg Oral QHS  ? ? ? ? ?Zola Button, MD ?[place], PGY-2  ?

## 2022-02-06 NOTE — Progress Notes (Signed)
? ? ? ?   Progress Note ? ? Subjective  ?Patient reports a bowel movement last night that was brown, and another this AM. No blood or melena reported. He responded to 1 unit PRBC. Nephrology has seen. Receiving albumin ? ? Objective  ? ?Vital signs in last 24 hours: ?Temp:  [97.4 ?F (36.3 ?C)-97.8 ?F (36.6 ?C)] 97.8 ?F (36.6 ?C) (05/03 0805) ?Pulse Rate:  [61-91] 67 (05/03 0835) ?Resp:  [15-20] 18 (05/03 0805) ?BP: (154-187)/(98-122) 174/98 (05/03 0835) ?SpO2:  [96 %-99 %] 98 % (05/03 0805) ?Weight:  [120.6 kg] 120.6 kg (05/03 0622) ?Last BM Date : 02/06/22 ?General:    AA male in NAD ?Abdomen:  Soft, nontender  ?Neurologic:  Alert and oriented,  grossly normal neurologically. ?Psych:  Cooperative. Normal mood and affect. ? ?Intake/Output from previous day: ?05/02 0701 - 05/03 0700 ?In: 1322.7 [P.O.:480; Blood:742.7; IV Piggyback:100] ?Out: 1052 [Urine:1050; Stool:2] ?Intake/Output this shift: ?Total I/O ?In: 480 [P.O.:480] ?Out: -  ? ?Lab Results: ?Recent Labs  ?  02/04/22 ?0316 02/05/22 ?0145 02/05/22 ?0628 02/05/22 ?1947 02/06/22 ?3086  ?WBC 10.7* 7.4  --   --  7.3  ?HGB 10.0* 7.2* 7.3* 8.2* 8.8*  ?HCT 31.2* 22.1* 22.5* 25.2* 27.2*  ?PLT 507* 379  --   --  352  ? ?BMET ?Recent Labs  ?  02/04/22 ?0316 02/05/22 ?0145 02/06/22 ?0228  ?NA 131* 134* 133*  ?K 4.8 4.6 4.6  ?CL 101 105 104  ?CO2 22 21* 19*  ?GLUCOSE 339* 264* 271*  ?BUN 29* 30* 34*  ?CREATININE 3.58* 3.63* 3.58*  ?CALCIUM 7.6* 7.7* 7.7*  ? ?LFT ?Recent Labs  ?  02/06/22 ?0228  ?PROT 5.3*  ?ALBUMIN 1.9*  ?AST 21  ?ALT 18  ?ALKPHOS 138*  ?BILITOT 0.4  ? ?PT/INR ?No results for input(s): LABPROT, INR in the last 72 hours. ? ?Studies/Results: ?No results found. ? ? ? ? Assessment / Plan:   ? ?56 y/o male with a history of EtOH cirrhosis, ascites,  admitted with anemia / melena. EGD 4/30 showed severe LA grade D esophagitis and duodenal ulcers. He has been on PPI without recurrent bleeding. Hgb has drifted likely from equilibration, s/p 1 unit PRBC yesterday  with appropriate response.  ?He has been on SBP prophylaxis with Abx, paracentesis showed no evidence of SBP.  ? ?Main active issue at this time is his AKI which has been persistent. Appreciate nephrology input, concern for HRS. On albumin infusions, renal function about the same today.  ? ?Moving forward, from a bleeding perspective he is stable. Was on lower dose protonix once daily, recommend '40mg'$  BID indefinitely right now given his history of recurrent bleeding and he will warrant a follow up with [name] once out of the hospital for these issues, and coordination for a repeat EGD in a few months to ensure mucosal healing.  ? ?At this point in time, main issue is AKI / HRS, nephrology managing. We will sign off for now. Continue BID protonix '40mg'$ , we will coordinate outpatient follow up after discharge.  ? ?Call with questions in the interim. ? ?Jolly Mango, MD ?[facility] ? ?

## 2022-02-06 NOTE — Progress Notes (Signed)
Mobility Specialist Progress Note ? ? 02/06/22 1600  ?Mobility  ?Activity Transferred to/from [facility]  ?Level of Assistance Minimal assist, patient does 75% or more  ?Assistive Device Front wheel walker  ?Distance Ambulated (ft) 4 ft  ?Activity Response Tolerated well  ?$Mobility charge 1 Mobility  ? ?Received pt in chair requesting assistance to [facility]. Required MinA for STS but contact guard for remainder of tx. Tx'd w/o fault and left w/ call bell in reach.   ? ?Holland Falling ?Mobility Specialist ?Phone Number [phone] ? ?

## 2022-02-06 NOTE — Telephone Encounter (Signed)
-----   Message from Yetta Flock, MD sent at 02/04/2022  5:58 PM EDT ----- ?Regarding: Jeffrey [name] follow up ?Hello, ?This patient will likely be in the hospital a few days yet but will need follow up with Dr. Carlean [name] or PA in the next 3-4 weeks or so, if you can help coordinate. Thanks ? ?

## 2022-02-06 NOTE — Progress Notes (Signed)
Physical Therapy Treatment ?Patient Details ?Name: Jeffrey [name] ?MRN: [id number] ?DOB: Oct 05, 1966 ?Today's Date: 02/06/2022 ? ? ?History of Present Illness Pt is a 56 y.o. male who presented 02/02/22 with weakness, black stools, and abdominal pain. EGD 4/30. Pt admitted with GI bleed. PMH: HTN, seizure disorder, DM2, hx of ETOH abuse, depression, hx of ABI, erosive esophagitis, cardiac arrest with anoxic brain injury, HLD. ? ?  ?PT Comments  ? ? Pt received in supine, sleeping but awoken with increased time, pt drowsy and with slow processing but agreeable to OOB mobility with encouragement. Pt with poor insight into deficits needing up to modA to safely ascend/descend standard height step via platform in room with RW to simulate handrails. Pt very unsteady with LOB x2 while performing stairs. Pt needed consistent minA to stand from lower height recliner seat<>RW. Pt agreeable to sit up in recliner at end of session with encouragement. RN notified pt BP elevated throughout but slightly orthostatic with initial sit>stand, pt reports asymptomatic. Pt continues to benefit from PT services to progress toward functional mobility goals.   ?Recommendations for follow up therapy are one component of a multi-disciplinary discharge planning process, led by the attending physician.  Recommendations may be updated based on patient status, additional functional criteria and insurance authorization. ? ?Follow Up Recommendations ? Home health PT (if pt has physical assist up/down stairs from someone capable of steadying him/for lift assist PRN) ?  ?  ?Assistance Recommended at Discharge Intermittent Supervision/Assistance  ?Patient can return home with the following A little help with walking and/or transfers;A little help with bathing/dressing/bathroom;Assistance with cooking/housework;Assist for transportation;Help with stairs or ramp for entrance ?  ?Equipment Recommendations ? None recommended by PT  ?  ?Recommendations for  Other Services Other (comment) (psych for reported depression) ? ? ?  ?Precautions / Restrictions Precautions ?Precautions: Fall;Other (comment) ?Precaution Comments: watch BP (hx of orthostatic hypotension and falls) ?Restrictions ?Weight Bearing Restrictions: No  ?  ? ?Mobility ? Bed Mobility ?Overal bed mobility: Needs Assistance ?Bed Mobility: Supine to Sit ?  ?  ?Supine to sit: Min assist, HOB elevated ?  ?  ?General bed mobility comments: Increased time and use of rail, pt able to raise trunk without physical assist but pt with poor technique and R LOB needing minA to maintain upright initially and poor bil hip translation to EOB for foot flat via transfer pad assist. ?  ? ?Transfers ?Overall transfer level: Needs assistance ?Equipment used: Rolling walker (2 wheels) ?Transfers: Sit to/from Stand ?Sit to Stand: Min guard, Min assist ?  ?  ?  ?  ?  ?General transfer comment: simulated home set up from EOB, min guard, but needing consistent minA for stand>sit transfers and standing from lower recliner height<>RW. ?  ? ?Ambulation/Gait ?Ambulation/Gait assistance: Min guard ?Gait Distance (Feet): 30 Feet ?Assistive device: Rolling walker (2 wheels) ?Gait Pattern/deviations: Step-through pattern, Decreased stride length, Wide base of support ?  ?  ?  ?General Gait Details: VSS on RA, pt with small low steps despite cues for improved foot clearance and heel strike pt with poor carryover of cues. ? ? ?Stairs ?Stairs: Yes ?Stairs assistance: Mod assist ?Stair Management: Step to pattern, Forwards, With walker (RW to simulate railing and 7" platform step in room) ?Number of Stairs: 2 ?General stair comments: pt ascending/descending single 7" platform step in room x2 reps. Pt needing mod cues for technique/safety and x2 lateral LOB to R side needing modA to correct each time despite safety cues and  BUE support of RW. Pt with poor insight into deficits and states "I want to keep practicing" when therapist moving step  away, PTA reinforced need for +2 staff next attempt for safety. When asked how it normally goes for him, pt states "this is how it usually goes" when he does steps. ? ? ? ? ?  ?Balance Overall balance assessment: Needs assistance ?Sitting-balance support: Feet supported, No upper extremity supported ?Sitting balance-Leahy Scale: Good ?  ?  ?Standing balance support: During functional activity, Reliant on assistive device for balance, Bilateral upper extremity supported ?Standing balance-Leahy Scale: Poor ?Standing balance comment: reliant on RW, multiple R lateral LOB while performing balance challenges (lifting foot up to platform step) ?  ?   ? ?  ?Cognition Arousal/Alertness: Awake/alert ?Behavior During Therapy: Flat affect, WFL for tasks assessed/performed ?Overall Cognitive Status: History of cognitive impairments - at baseline ?  ?   ?  ?  ?  ?General Comments: alert and oriented x4, slow processing noted with increased time for processing of directions required, good one step direction following with increased time. Pt with poor insight into deficits and pt with multiple lateral LOB while performing platform step in room needing modA from therapist to correct balance, after performing x2 unsteadily therapist taking step away and pt states "I want to keep going". Had to reinforce with him need for second person next attempt for safety due to LOB and risk with BP being elevated he needs to avoid overexertion. Pt also perseverating on IVs "feeling loose" although per RN not a concern, pt still asking questions about it despite PTA reassurance. Pt's RN also reports episodes of short term memory deficits as pt unable to recall to MD what he had for breakfast, etc; pt with hx ABI. ?  ?  ? ?  ?Exercises General Exercises - Lower Extremity ?Long Arc Quad: AROM, Both, 10 reps, Seated ?Hip Flexion/Marching: AROM, Both, 10 reps, Standing ?Other Exercises ?Other Exercises: edu re: importance of oob mobility,  positioning ?Other Exercises: standing BLE AROM: hamstring curls, heel raises x10 reps ea ?Other Exercises: STS x 4 reps ? ?  ?General Comments General comments (skin integrity, edema, etc.): BP 175/112 (131) sitting EOB after ~3 mins (initial reading higher); BP 150/96 (110) standing at bedside (asymptomatic); BP 170/104 (123) sitting in chair with legs reclined. VSS otherwise on RA HR 60's-80's bpm when assessed pre/post ?  ?  ? ?Pertinent Vitals/Pain Pain Assessment ?Pain Assessment: No/denies pain  ? ? ? ?PT Goals (current goals can now be found in the care plan section) Acute Rehab PT Goals ?Patient Stated Goal: to go home with HHPT and get help for his depression ?PT Goal Formulation: With patient ?Time For Goal Achievement: 02/17/22 ?Progress towards PT goals: Progressing toward goals ? ?  ?Frequency ? ? ? Min 3X/week ? ? ? ?  ?PT Plan Current plan remains appropriate  ? ? ?   ?AM-PAC PT "6 Clicks" Mobility   ?Outcome Measure ? Help needed turning from your back to your side while in a flat bed without using bedrails?: A Little ?Help needed moving from lying on your back to sitting on the side of a flat bed without using bedrails?: A Little ?Help needed moving to and from a bed to a chair (including a wheelchair)?: A Little ?Help needed standing up from a chair using your arms (e.g., wheelchair or bedside chair)?: A Little ?Help needed to walk in hospital room?: A Little ?Help needed climbing 3-5 steps with  a railing? : A Lot ?6 Click Score: 17 ? ?  ?End of Session Equipment Utilized During Treatment: Gait belt ?Activity Tolerance: Patient tolerated treatment well ?Patient left: in chair;with call bell/phone within reach;Other (comment) (NT/RN made aware no chair alarm in place, pt has been compliant with call bell per staff OK to defer chair alarm) ?Nurse Communication: Mobility status;Other (comment) (pt asking multiple times about both of his UE IVs, pt slow processing/poor safety awareness with LOB on  stairs) ?PT Visit Diagnosis: Unsteadiness on feet (R26.81);Muscle weakness (generalized) (M62.81);History of falling (Z91.81);Other abnormalities of gait and mobility (R26.89);Repeated falls (R29.6);Difficulty in walkin

## 2022-02-06 NOTE — Telephone Encounter (Signed)
Pt was scheduled for an office visit with Dr. Carlean Purl on 03-22-2022 at 8:50: ?Will notify pt once discharged from Hospital  ?

## 2022-02-06 NOTE — Progress Notes (Signed)
Pt continues to refuse CPAP °

## 2022-02-06 NOTE — Progress Notes (Signed)
Mobility Specialist Progress Note ? ? 02/06/22 1625  ?Mobility  ?Activity Ambulated with assistance in hallway  ?Level of Assistance Contact guard assist, steadying assist  ?Assistive Device Front wheel walker  ?Distance Ambulated (ft) 220 ft  ?Activity Response Tolerated well  ?$Mobility charge 1 Mobility  ? ?During Mobility: 78 HR ?Post Mobility: 63 HR, 163/102 BP ? ?Received in chair w/ no complaints and agreeable. Required increase time but asymptomatic throughout w/ no faults. Returned back to chair w/ call bell in reach and tray in front. ? ?Holland Falling ?Mobility Specialist ?Phone Number [phone] ? ?

## 2022-02-06 NOTE — Progress Notes (Signed)
Received phone call from pt family member stating he was the step-son. He was very upset, said his sisters were very upset, and stated numerous times (referring to the pt) "he just needs to be sent to a nursing home, he is stressing [name] out, he keeps falling, and he can just stay there the rest of his life because he cannot come back here". This was the pt residence prior to coming to the hospital. I explained there is a process to having a pt placed in a SNF and that I would relay the message to CM and CSM. Nurse sent message informing CM and CSM of conversation. Pearson Grippe stated his name is [name], phone number [phone], and that he wants a call back with information.  ? ?Raelyn Number, RN ? ?

## 2022-02-06 NOTE — Progress Notes (Signed)
FPTS Brief Progress Note ? ?S:Went to see patient with Dr. Chauncey Reading. Patient sitting comfortably in bed with no complaints. Patient denies any dizziness, SOB, chest pain, and is feeling well. Nurse reports patient had normal looking bowel movement and void.  ? ? ?O: ?BP (!) 174/104 (BP Location: Right Arm)   Pulse 61   Temp (!) 97.4 ?F (36.3 ?C) (Oral)   Resp 18   Ht '5\' 10"'$  (1.778 m)   Wt 121.6 kg   SpO2 99%   BMI 38.47 kg/m?   ?General: Well appearing, conversant, obese, polite, NAD, BLE pitting edema ? ?A/P: ?- Plans per day team ?- Orders reviewed. Labs for AM ordered, which was adjusted as needed.  ? Holley Bouche, MD ?02/06/2022, 2:40 AM ?PGY-1, [facility] Night Resident  ?Please page [phone] with questions.  ? ? ?

## 2022-02-07 LAB — BASIC METABOLIC PANEL
Anion gap: 11 (ref 5–15)
BUN: 34 mg/dL — ABNORMAL HIGH (ref 6–20)
CO2: 19 mmol/L — ABNORMAL LOW (ref 22–32)
Calcium: 8.1 mg/dL — ABNORMAL LOW (ref 8.9–10.3)
Chloride: 105 mmol/L (ref 98–111)
Creatinine, Ser: 3.45 mg/dL — ABNORMAL HIGH (ref 0.61–1.24)
GFR, Estimated: 20 mL/min — ABNORMAL LOW (ref >60)
Glucose, Bld: 178 mg/dL — ABNORMAL HIGH (ref 70–99)
Potassium: 4.9 mmol/L (ref 3.5–5.1)
Sodium: 135 mmol/L (ref 135–145)

## 2022-02-07 LAB — GLUCOSE, CAPILLARY
Glucose-Capillary: 171 mg/dL — ABNORMAL HIGH (ref 70–99)
Glucose-Capillary: 50 mg/dL — ABNORMAL LOW (ref 70–99)
Glucose-Capillary: 53 mg/dL — ABNORMAL LOW (ref 70–99)
Glucose-Capillary: 125 mg/dL — ABNORMAL HIGH (ref 70–99)
Glucose-Capillary: 162 mg/dL — ABNORMAL HIGH (ref 70–99)
Glucose-Capillary: 176 mg/dL — ABNORMAL HIGH (ref 70–99)

## 2022-02-07 LAB — CBC
HCT: 30.2 % — ABNORMAL LOW (ref 39.0–52.0)
Hemoglobin: 9.8 g/dL — ABNORMAL LOW (ref 13.0–17.0)
MCH: 28.2 pg (ref 26.0–34.0)
MCHC: 32.5 g/dL (ref 30.0–36.0)
MCV: 86.8 fL (ref 80.0–100.0)
Platelets: 383 10*3/uL (ref 150–400)
RBC: 3.48 MIL/uL — ABNORMAL LOW (ref 4.22–5.81)
RDW: 20.1 % — ABNORMAL HIGH (ref 11.5–15.5)
WBC: 7.9 10*3/uL (ref 4.0–10.5)
nRBC: 0 % (ref 0.0–0.2)

## 2022-02-07 LAB — KAPPA/LAMBDA LIGHT CHAINS
Kappa free light chain: 181.4 mg/L — ABNORMAL HIGH (ref 3.3–19.4)
Kappa, lambda light chain ratio: 1.32 (ref 0.26–1.65)
Lambda free light chains: 137.4 mg/L — ABNORMAL HIGH (ref 5.7–26.3)

## 2022-02-07 LAB — SURGICAL PATHOLOGY

## 2022-02-07 LAB — CULTURE, BODY FLUID W GRAM STAIN -BOTTLE: Culture: NO GROWTH

## 2022-02-07 LAB — CULTURE, BLOOD (ROUTINE X 2)
Culture: NO GROWTH
Culture: NO GROWTH

## 2022-02-07 MED ORDER — AMLODIPINE BESYLATE 5 MG PO TABS
5.0000 mg | ORAL_TABLET | Freq: Every day | ORAL | Status: DC
  Administered 2022-02-07 – 2022-02-11 (×5): 5 mg via ORAL
  Filled 2022-02-07 (×5): qty 1

## 2022-02-07 MED ORDER — FUROSEMIDE 10 MG/ML IJ SOLN
80.0000 mg | Freq: Once | INTRAMUSCULAR | Status: AC
  Administered 2022-02-07: 80 mg via INTRAVENOUS
  Filled 2022-02-07: qty 8

## 2022-02-07 NOTE — TOC Initial Note (Signed)
Transition of Care (TOC) - Initial/Assessment Note  ? ? ?Patient Details  ?Name: Jeffrey [name] ?MRN: [id number] ?Date of Birth: [birth year] ? ?Transition of Care (TOC) CM/SW Contact:    ?Vinie Sill, LCSW ?Phone Number: ?02/07/2022, 11:58 AM ? ?Clinical Narrative:                 ? ?CSW met with patient at bedside. CSW introduced self and explained role. Patient reports he lives in the home with his 3 step-children( [place]). CSW given permission to share information with his children. Patient reports family is frustrated because he keeps falling and they are unable to get him off the floor. They have to call EMS or Fire Station to get him up. He states he knows this is hard on his family. Per patient at last admission he was discharged to [facility], but he did not stay because it was too far and his children could not come and visit with him. CSW explained the SNF process and advised [facility] has limited in network facilities,therefore, he will have to be open to SNFs outside of [place]. CSW explained if recommended for SNF, which currently is [facility], CSW will search for placement in [place] and surrounding area. Patient states understanding.  ?  ? TOC will continue to follow and assist with discharge planning.  ? ?Thurmond Butts, MSW, LCSW ?Clinical Social Worker ? ? ? ? ?Patient Goals and CMS Choice ?  ?  ?  ? ?Expected Discharge Plan and Services ?  ?In-house Referral: Clinical Social Work ?  ?  ?Living arrangements for the past 2 months: [place] ?                ?  ?  ?  ?  ?  ?  ?  ?  ?  ?  ? ?Prior Living Arrangements/Services ?Living arrangements for the past 2 months: [place] ?Lives with:: Self, Adult Children ?Patient language and need for interpreter reviewed:: No ?       ?Need for Family Participation in Patient Care: Yes (Comment) ?Care giver support system in place?: Yes (comment) ?  ?Criminal Activity/Legal Involvement Pertinent to Current  Situation/Hospitalization: No - Comment as needed ? ?Activities of Daily Living ?  ?  ? ?Permission Sought/Granted ?Permission sought to share information with : Family Supports, Case Freight forwarder, Customer service manager ?Permission granted to share information with : Yes, Verbal Permission Granted ?   ? Permission granted to share info w AGENCY: SNFS ?   ?   ? ?Emotional Assessment ?Appearance:: Appears stated age ?Attitude/Demeanor/Rapport: Engaged ?Affect (typically observed): Appropriate ?Orientation: : Oriented to Self, Oriented to Place, Oriented to  Time, Oriented to Situation ?Alcohol / Substance Use: Not Applicable ?Psych Involvement: No (comment) ? ?Admission diagnosis:  GI bleed [K92.2] ?Patient Active Problem List  ? Diagnosis Date Noted  ? Gastroesophageal reflux disease with esophagitis and hemorrhage   ? UTI due to Klebsiella species 01/11/2022  ? Alcohol use   ? Orthostatic hypotension 01/08/2022  ? Iron deficiency anemia due to chronic blood loss   ? Symptomatic anemia   ? Pre-syncope 12/20/2021  ? Hypocalcemia 12/06/2021  ? Demand ischemia ([place]) 09/16/2021  ? Hypothermia 09/13/2021  ? Hypoalbuminemia   ? Recurrent falls 07/29/2021  ? Grief counseling 01/04/2021  ? Incontinence 12/23/2020  ? Hospital discharge follow-up 11/24/2020  ? Syncope 11/17/2020  ? Hypomagnesemia 11/05/2020  ? Hematemesis with nausea   ? Alcohol abuse with intoxication ([place])   ?  Duodenal ulcer hemorrhagic   ? GI bleed 10/31/2020  ? Hyperglycemia 10/31/2020  ? DKA (diabetic ketoacidosis) ([place]) 07/22/2020  ? Candidiasis 07/22/2020  ? Hyponatremia 06/28/2020  ? Eyebrow laceration, left, initial encounter   ? Hypokalemia   ? Hyperlipidemia associated with type 2 diabetes mellitus ([place])   ? MGUS (monoclonal gammopathy of unknown significance) 05/31/2020  ? Seizure disorder ([place]) 03/16/2020  ? Type 2 diabetes mellitus without complication, with long-term current use of insulin ([place]) 03/16/2020  ? Tremor 03/16/2020  ? Colon  cancer screening 02/22/2020  ? Stage 3a chronic kidney disease ([place]) 01/29/2020  ? Hyperphosphatemia 01/29/2020  ? Major depressive disorder, recurrent episode ([place]) 01/13/2018  ? Weakness   ? Sepsis secondary to UTI [facility])   ? Hypotension 07/27/2017  ? Seizure ([place]) 04/08/2017  ? Proteinuria 11/23/2015  ? Cirrhosis ([place]) 03/22/2015  ? Ataxia 03/20/2015  ? Elevated CK   ? Dysarthria 03/09/2015  ? Acute renal failure ([place])   ? Acute blood loss anemia 11/17/2014  ? ETOH abuse   ? Tachycardia   ? Hematemesis 11/16/2014  ? Melena 10/10/2014  ? Erosive esophagitis 10/10/2014  ? Anemia of chronic disease   ? Somnolence 10/08/2014  ? Acute kidney injury ([place])   ? OSA on CPAP 12/28/2012  ? Type 2 diabetes mellitus with hypoglycemia without coma ([place]) 12/07/2012  ? Cardiac arrest ([place]) 12/07/2012  ? Anoxic brain injury ([place]) 12/07/2012  ? ERECTILE DYSFUNCTION 04/14/2007  ? Type 2 diabetes mellitus with diabetic neuropathy, with long-term current use of insulin ([place]) 12/04/2006  ? HYPERCHOLESTEROLEMIA 12/04/2006  ? ?PCP:  [name], MD ?Pharmacy:   ?[place], [place] ?[place] [address] ?Phone: [phone] Fax: [phone] ? ?[facility] - [place], [place] ?[place] ?[place] [address] ?Phone: [phone] Fax: [phone] ? ?[place] ?[place] ?[place] [address] ?Phone: [phone] Fax: [phone] ? ?[place], [place] ?[place] ?[place] [address] ?Phone: [phone] Fax: [phone] ? ?[name] ?[address] ?[place] [address] ?Phone: [phone] Fax: [phone] ? ? ? ? ?Social Determinants of Health (SDOH) Interventions ?  ? ?Readmission Risk Interventions ? ?  12/21/2021  ?  3:10 PM 09/17/2021  ? 12:59 PM 08/01/2021  ?  1:24 PM   ?Readmission Risk Prevention Plan  ?Transportation Screening Complete Complete Complete  ?PCP or Specialist Appt within 5-7 Days   Complete  ?PCP or Specialist Appt within 3-5 Days Complete Complete   ?Home Care Screening   Patient refused  ?Medication Review (RN CM)   Complete  ?[place] or Home Care Consult Complete Complete   ?Social Work Consult for [place] Planning/Counseling Complete Complete   ?Palliative Care Screening Not Applicable Not Applicable   ?Medication Review Press photographer) Complete Complete   ? ? ? ?

## 2022-02-07 NOTE — Progress Notes (Signed)
? [facility] ?Progress Note  ? ? ?Assessment/ Plan:   ?Jeffrey [name] is a 56 y.o. male with history of alcoholic cirrhosis, B1DV, CKD IIIa, adrenal insufficiency, PEA arrest with anoxic brain injury, presenting with melena concerning for GI bleed.  Hospital course complicated by AKI superimposed on CKD. ? ?1.  Oliguric AKI superimposed on CKD IIIa - Suspect this may be more ATN-mediated rather than HRS given lack of response to albumin. Cannot definitively rule out intrinsic renal pathology given proteinuria and hematuria, will plan to further evaluate for casts today or tomorrow. Cr with insignificant improvement with albumin trial. Urine output is poor. ?- diuresis challenge with IV furosemide 80 mg ?- may need renal biopsy at some point, but not during this hospitalization ?- needs urology outpatient follow-up for chronic hematuria ?2.  Nephrotic range proteinuria - AKI is likely contributing, suspect also related to underlying diabetic kidney disease.  ?- SPEP, kappa/lambda light chains pending for myeloma/amyloidosis (though lower suspicion) ?3.  Hypoalbuminemia - significant 3rd spacing on exam, likely secondary to underlying cirrhosis but concomitant nephrotic syndrome is a possibility ?4.  Urinary retention - No obstruction seen on renal US. Improving with tamsulosin, bladder scans appropriate. ?5.  Cirrhosis with ascites - s/p low volume paracentesis 4/29, per primary ?6.  HTN - remains elevated on propranolol, per primary ?- consider starting low dose CCB (given supine hypertension), will defer to primary given history of orthostatic hypotension ?7.  T2DM - per primary ?8.  Adrenal insufficiency ? ?Remainder per primary ? ?Subjective:   ?This morning reports difficulty with urination continues to improve but not quite at baseline. Eating breakfast currently. He is wanting to go to SNF in [place]. ? ?Urine output 0.1 ml/kg/hr in the past 24 hours. Bladder scan 231 yesterday around  noon, none documented since.  ? ?Objective:   ?BP (!) 164/98 (BP Location: Left Arm)   Pulse 65   Temp 98.1 ?F (36.7 ?C) (Oral)   Resp 20   Ht '5\' 10"'$  (1.778 m)   Wt 121.6 kg   SpO2 94%   BMI 38.47 kg/m?  ? ?Intake/Output Summary (Last 24 hours) at 02/07/2022 0750 ?Last data filed at 02/06/2022 2358 ?Gross per 24 hour  ?Intake 480 ml  ?Output 325 ml  ?Net 155 ml  ? ? ?Weight change: 1 kg ? ?Physical Exam: ?Gen: Alert, sitting up eating breakfast, NAD ?CVS: RRR, no murmurs ?Resp: Clear to auscultation bilaterally ?Abd: Soft, nontender ?Ext: 2-3+ pitting edema bilateral lower extremities up to the proximal thighs, 1+ pitting edema in bilateral forearms ? ?Imaging: ?No results found. ? ?Labs: ?BMET ?Recent Labs  ?Lab 02/02/22 ?7616 02/03/22 ?0421 02/04/22 ?0737 02/05/22 ?0145 02/06/22 ?1062 02/07/22 ?0117  ?NA 129* 131* 131* 134* 133* 135  ?K 4.4 4.7 4.8 4.6 4.6 4.9  ?CL 102 103 101 105 104 105  ?CO2 15* 20* 22 21* 19* 19*  ?GLUCOSE 541* 217* 339* 264* 271* 178*  ?BUN 28* 26* 29* 30* 34* 34*  ?CREATININE 3.59* 3.57* 3.58* 3.63* 3.58* 3.45*  ?CALCIUM 6.9* 7.2* 7.6* 7.7* 7.7* 8.1*  ? ? ?CBC ?Recent Labs  ?Lab 02/02/22 ?6948 02/03/22 ?0421 02/04/22 ?5462 02/05/22 ?0145 02/05/22 ?7035 02/05/22 ?1947 02/06/22 ?0228 02/07/22 ?0117  ?WBC 9.4   < > 10.7* 7.4  --   --  7.3 7.9  ?NEUTROABS 6.2  --   --   --   --   --   --   --   ?HGB 8.1*   < > 10.0*  7.2* 7.3* 8.2* 8.8* 9.8*  ?HCT 24.9*   < > 31.2* 22.1* 22.5* 25.2* 27.2* 30.2*  ?MCV 85.6   < > 84.3 84.4  --   --  85.5 86.8  ?PLT 378   < > 507* 379  --   --  352 383  ? < > = values in this interval not displayed.  ? ? ? ?Medications:   ? ? sodium chloride   Intravenous Once  ? feeding supplement  1 Container Oral TID BM  ? hydrocortisone  10 mg Oral q AM  ? And  ? hydrocortisone  5 mg Oral QPM  ? insulin aspart  0-9 Units Subcutaneous TID WC  ? insulin glargine-yfgn  10 Units Subcutaneous Daily  ? levETIRAcetam  500 mg Oral BID  ? mirtazapine  30 mg Oral QHS  ? multivitamin  with minerals  1 tablet Oral Daily  ? pantoprazole  40 mg Oral BID  ? polyethylene glycol  17 g Oral Daily  ? propranolol  40 mg Oral TID  ? rosuvastatin  5 mg Oral Daily  ? tamsulosin  0.4 mg Oral Daily  ? thiamine  100 mg Oral QHS  ? ? ? ? ?Zola Button, MD ?[place], PGY-2  ?

## 2022-02-07 NOTE — Progress Notes (Signed)
Mobility Specialist Progress Note ? ? 02/07/22 1119  ?Mobility  ?Activity Ambulated with assistance in hallway  ?Level of Assistance Contact guard assist, steadying assist  ?Assistive Device Front wheel walker  ?Distance Ambulated (ft) 290 ft  ?Activity Response Tolerated well  ?$Mobility charge 1 Mobility  ? ?Pre Mobility: 72 HR, 170/105 BP, 98% SpO2 ?During Mobility: 88 HR, 96% SpO2 ?Post Mobility: 60 HR, 178/106 BP, 96% SpO2 ? ?Received pt in bed having no complaints and agreeable. BP presenting high pre-mobility but pt asymptomatic and feeling okay. Checked in w/ RN prior to continuing. Pt having no faults while ambulating but ambulates w/ swaying like gait from left to right. Returned back to chair and placed call bell in reach w/ chair alarm on.  ? ?Holland Falling ?Mobility Specialist ?Phone Number [phone] ? ?

## 2022-02-07 NOTE — Progress Notes (Addendum)
Physical Therapy Treatment ?Patient Details ?Name: Jeffrey [name] ?MRN: [id number] ?DOB: 08/03/1966 ?Today's Date: 02/07/2022 ? ? ?History of Present Illness Pt is a 56 y.o. male who presented 02/02/22 with weakness, black stools, and abdominal pain. EGD 4/30. Pt admitted with GI bleed. PMH: HTN, seizure disorder, DM2, hx of ETOH abuse, depression, hx of ABI, erosive esophagitis, cardiac arrest with anoxic brain injury, HLD. ? ?  ?PT Comments  ? ? Pt received in supine, sleeping but easily awoken and agreeable to therapy session with increased time for processing noted today. Pt with noted short term memory deficits (hx ABI) but eager to continue with strengthening and stair training. Pt performed step-ups on platform in room with +2 modA and RW for BUE support. Pt needing heavy minA for standing from standard height chair/lowest bed height when using momentum strategy to stand, unable to stand unassisted from these heights. Pt able to progress gait distance today with stable HR and c/o moderate fatigue at end of session, agreeable to sit up in recliner and chair alarm on for safety. Pt continues to benefit from PT services to progress toward functional mobility goals. Disposition updated below per discussion with pt and supervising PT Anessa P.  ?Recommendations for follow up therapy are one component of a multi-disciplinary discharge planning process, led by the attending physician.  Recommendations may be updated based on patient status, additional functional criteria and insurance authorization. ? ?Follow Up Recommendations ? Skilled nursing-short term rehab (<3 hours/day) ?  ?  ?Assistance Recommended at Discharge Intermittent Supervision/Assistance  ?Patient can return home with the following A little help with walking and/or transfers;A little help with bathing/dressing/bathroom;Assistance with cooking/housework;Assist for transportation;Help with stairs or ramp for entrance ?  ?Equipment Recommendations ? None  recommended by PT  ?  ?Recommendations for Other Services Other (comment) (psych for reported depression) ? ? ?  ?Precautions / Restrictions Precautions ?Precautions: Fall ?Precaution Comments: watch BP (hx of orthostatic hypotension and falls) ?Restrictions ?Weight Bearing Restrictions: No  ?  ? ?Mobility ? Bed Mobility ?Overal bed mobility: Needs Assistance ?Bed Mobility: Supine to Sit ?  ?  ?Supine to sit: Min assist, HOB elevated ?  ?  ?General bed mobility comments: to L EOB, from flat bed; increased time, heavy use of bed rail, minA for trunk raise and max cues for sequencing without assist if able ?  ? ?Transfers ?Overall transfer level: Needs assistance ?Equipment used: Rolling walker (2 wheels) ?Transfers: Sit to/from Stand ?Sit to Stand: Min assist ?  ?  ?  ?  ?  ?General transfer comment: Pt needing consistent minA for stand>sit transfers and standing from lower recliner height<>RW ?  ? ?Ambulation/Gait ?Ambulation/Gait assistance: Min guard ?Gait Distance (Feet): 110 Feet ?Assistive device: Rolling walker (2 wheels) ?Gait Pattern/deviations: Step-through pattern, Decreased stride length, Wide base of support, Drifts right/left ?Gait velocity: reduced ?Gait velocity interpretation: <1.31 ft/sec, indicative of household ambulator ?  ?General Gait Details: No dizziness reported; pt with small low steps despite cues for improved foot clearance and heel strike pt with poor carryover of cues. heavy reliance on BUE support of RW, cues for activity pacing ? ? ?Stairs ?Stairs: Yes ?Stairs assistance: Mod assist, +2 physical assistance ?Stair Management: With walker, Forwards, Step to pattern ?Number of Stairs: 5 ?General stair comments: pt ascending/descending single 7" platform step in room x5 reps with RW to simulate B handrails. Pt needing mod cues for technique/safety, with +2 physical assist pt with minor LOB each rep but slightly steadier than  previous date when he only had +1 assist. ? ? ?  ?Balance  Overall balance assessment: Needs assistance ?Sitting-balance support: Feet supported ?Sitting balance-Leahy Scale: Good ?  ?  ?Standing balance support: Reliant on assistive device for balance ?Standing balance-Leahy Scale: Poor ?Standing balance comment: reliant on RW, multiple R lateral LOB while performing balance challenges (lifting foot up to platform step) ?   ?  ? ?  ?Cognition Arousal/Alertness: Awake/alert ?Behavior During Therapy: [facility] for tasks assessed/performed ?Overall Cognitive Status: History of cognitive impairments - at baseline ?   ?  ?  ?  ?General Comments: slow processing, decreased recall of prior events today, pt reporting he has already ordered dinner but it is 6pm and not in room yet, RN notified to double check. ?  ?  ? ?  ?Exercises   ? ?  ?General Comments General comments (skin integrity, edema, etc.): BP elevated 150's/90's, per chart review this is improved from readings taken earlier in the day. HR WFL, SpO2 WFL on RA ?  ?  ? ?Pertinent Vitals/Pain Pain Assessment ?Pain Assessment: No/denies pain  ? ? ?   ?   ? ?PT Goals (current goals can now be found in the care plan section) Acute Rehab PT Goals ?Patient Stated Goal: to get stronger, "not to fall on the floor" ?PT Goal Formulation: With patient ?Time For Goal Achievement: 02/17/22 ?Progress towards PT goals: Progressing toward goals ? ?  ?Frequency ? ? ? Min 2X/week ? ? ? ?  ?PT Plan Discharge plan needs to be updated;Frequency needs to be updated  ? ? ?   ?AM-PAC PT "6 Clicks" Mobility   ?Outcome Measure ? Help needed turning from your back to your side while in a flat bed without using bedrails?: A Little ?Help needed moving from lying on your back to sitting on the side of a flat bed without using bedrails?: A Lot (mod cues) ?Help needed moving to and from a bed to a chair (including a wheelchair)?: A Little ?Help needed standing up from a chair using your arms (e.g., wheelchair or bedside chair)?: A Lot (mod cues and lift  assist from regular chair height) ?Help needed to walk in hospital room?: A Little ?Help needed climbing 3-5 steps with a railing? : A Lot ?6 Click Score: 15 ? ?  ?End of Session Equipment Utilized During Treatment: Gait belt ?Activity Tolerance: Patient tolerated treatment well ?Patient left: in chair;with call bell/phone within reach;with chair alarm set ?Nurse Communication: Mobility status ?PT Visit Diagnosis: Unsteadiness on feet (R26.81);Muscle weakness (generalized) (M62.81);History of falling (Z91.81);Other abnormalities of gait and mobility (R26.89);Repeated falls (R29.6);Difficulty in walking, not elsewhere classified (R26.2) ?  ? ? ?Time: 9794-8016 ?PT Time Calculation (min) (ACUTE ONLY): 23 min ? ?Charges:  $Gait Training: 8-22 mins ?$Therapeutic Activity: 8-22 mins          ?          ? ?Jeffrey Bruington P., PTA ?Acute Rehabilitation Services ?Secure Chat Preferred 9a-5:30pm ?Office: [phone]  ? ? ?Jeffrey [name] ?02/07/2022, 6:15 PM ? ?

## 2022-02-07 NOTE — Progress Notes (Signed)
FPTS Brief Progress Note ? ?S:Went to bedside to see patient, patient awake sitting on edge of bed. Requested towel and soap from nurse to bathe himself. Patient also reported that he wanted to go the SNF. Notes that his kids don't want him to come back home, so he would like to got to SNF, as long as it's in [place].  ? ? ?O: ?BP (!) 172/105   Pulse 76   Temp (!) 97.5 ?F (36.4 ?C) (Oral)   Resp 20   Ht '5\' 10"'$  (1.778 m)   Wt 120.6 kg   SpO2 95%   BMI 38.15 kg/m?   ? ? ?A/P: ?- Plans per day team ?- Orders reviewed. Labs for AM ordered, which was adjusted as needed.  ? ? ?Holley Bouche, MD ?02/07/2022, 2:53 AM ?PGY-1, [place] [facility] Night Resident  ?Please page [phone] with questions.  ? ? ?

## 2022-02-07 NOTE — Progress Notes (Signed)
Occupational Therapy Treatment ?Patient Details ?Name: Jeffrey [name] ?MRN: [id number] ?DOB: 06-18-1966 ?Today's Date: 02/07/2022 ? ? ?History of present illness Pt is a 56 y.o. male who presented 02/02/22 with weakness, black stools, and abdominal pain. EGD 4/30. Pt admitted with GI bleed. PMH: HTN, seizure disorder, DM2, hx of ETOH abuse, depression, hx of ABI, erosive esophagitis, cardiac arrest with anoxic brain injury, HLD. ?  ?OT comments ? Patient continues to participate, and is making consistent progress toward goals.  He is needing up to Mod A for lower body ADL from a sit/stand level, at home he has to use a hip kit.  Mobility is at [facility] due to decreased safety and poor dynamic balance.  OT will continue efforts in the acute setting, but he does not has the needed 24 hour up to Mod A at home.  His children are grown, but have learning disabilities that impact their ability to assist him physically.  OT is recommending post acute rehab to maximize his independence, reduce his fall risk, and ensure a safe transition home.  Unfortunately he does not have any other family that provide the needed assist at home.    ? ?Recommendations for follow up therapy are one component of a multi-disciplinary discharge planning process, led by the attending physician.  Recommendations may be updated based on patient status, additional functional criteria and insurance authorization. ?   ?Follow Up Recommendations ? Skilled nursing-short term rehab (<3 hours/day)  ?  ?Assistance Recommended at Discharge Frequent or constant Supervision/Assistance  ?Patient can return home with the following ? A little help with walking and/or transfers;A lot of help with bathing/dressing/bathroom;Help with stairs or ramp for entrance;Assist for transportation;Assistance with cooking/housework ?  ?Equipment Recommendations ? None recommended by OT  ?  ?Recommendations for Other Services   ? ?  ?Precautions / Restrictions  Precautions ?Precautions: Fall ?Restrictions ?Weight Bearing Restrictions: No  ? ? ?  ? ?Mobility Bed Mobility ?  ?  ?  ?  ?  ?  ?  ?General bed mobility comments: up in recliner ?  ? ?Transfers ?Overall transfer level: Needs assistance ?Equipment used: Rolling walker (2 wheels) ?Transfers: Sit to/from Stand ?Sit to Stand: Min guard ?  ?  ?  ?  ?  ?  ?  ?  ?Balance Overall balance assessment: Needs assistance ?Sitting-balance support: Feet supported ?Sitting balance-Leahy Scale: Good ?  ?  ?Standing balance support: Reliant on assistive device for balance ?Standing balance-Leahy Scale: Poor ?  ?  ?  ?  ?  ?  ?  ?  ?  ?  ?  ?  ?   ? ?ADL either performed or assessed with clinical judgement  ? ?ADL   ?Eating/Feeding: Independent;Sitting ?  ?Grooming: Oral care;Standing;Min guard;Wash/dry face;Wash/dry hands ?  ?Upper Body Bathing: Set up;Sitting ?  ?Lower Body Bathing: Sit to/from stand;Cueing for safety;Moderate assistance ?  ?Upper Body Dressing : Minimal assistance;Sitting ?Upper Body Dressing Details (indicate cue type and reason): difficulty bringing items around his back.  Assist to [place] with lines ?Lower Body Dressing: Moderate assistance;Sit to/from stand ?  ?Toilet Transfer: Min guard;Rolling walker (2 wheels);Regular Toilet ?  ?  ?  ?  ?  ?  ?  ?  ? ?Extremity/Trunk Assessment Upper Extremity Assessment ?Upper Extremity Assessment: Generalized weakness ?  ?Lower Extremity Assessment ?Lower Extremity Assessment: Defer to PT evaluation ?  ?Cervical / Trunk Assessment ?Cervical / Trunk Assessment: Kyphotic ?  ? ?Vision Baseline Vision/History: 1 Wears  glasses ?Patient Visual Report: No change from baseline ?  ?  ?Perception   ?  ?Praxis   ?  ? ?Cognition Arousal/Alertness: Awake/alert ?Behavior During Therapy: [facility] for tasks assessed/performed ?Overall Cognitive Status: History of cognitive impairments - at baseline ?  ?  ?  ?  ?  ?  ?  ?  ?  ?  ?  ?  ?  ?  ?  ?  ?  ?  ?  ?   ?   ? ?  ?   ? ? ?  ?General  Comments  Continues to present with elevated BP  ? ? ?Pertinent Vitals/ Pain       Pain Assessment ?Pain Assessment: No/denies pain ? ?   ?  ?  ?  ?  ?  ?  ?  ?  ?  ?  ?  ?  ?  ?  ?  ?  ?  ?  ? ?  ?    ?  ?  ?  ?   ? ?Frequency ? Min 2X/week  ? ? ? ? ?  ?Progress Toward Goals ? ?OT Goals(current goals can now be found in the care plan section) ? Progress towards OT goals: Progressing toward goals ? ?Acute Rehab OT Goals ?Patient Stated Goal: I need to get stronger before I can go home ?OT Goal Formulation: With patient ?Time For Goal Achievement: 02/19/22 ?Potential to Achieve Goals: Good  ?Plan Discharge plan needs to be updated   ? ?Co-evaluation ? ? ?   ?  ?  ?  ?  ? ?  ?AM-PAC OT "6 Clicks" Daily Activity     ?Outcome Measure ? ? Help from another person eating meals?: None ?Help from another person taking care of personal grooming?: A Little ?Help from another person toileting, which includes using toliet, bedpan, or urinal?: A Little ?Help from another person bathing (including washing, rinsing, drying)?: A Lot ?Help from another person to put on and taking off regular upper body clothing?: A Lot ?Help from another person to put on and taking off regular lower body clothing?: A Lot ?6 Click Score: 16 ? ?  ?End of Session Equipment Utilized During Treatment: Gait belt;Rolling walker (2 wheels) ? ?OT Visit Diagnosis: Unsteadiness on feet (R26.81);Muscle weakness (generalized) (M62.81) ?  ?Activity Tolerance Patient tolerated treatment well ?  ?Patient Left in chair;with call bell/phone within reach;with chair alarm set ?  ?Nurse Communication Mobility status ?  ? ?   ? ?Time: 6950-7225 ?OT Time Calculation (min): 17 min ? ?Charges: OT General Charges ?$OT Visit: 1 Visit ?OT Treatments ?$Self Care/Home Management : 8-22 mins ? ?02/07/2022 ? ?RP, OTR/L ? ?Acute Rehabilitation Services ? ?Office:  [phone] ? ? ?[place] Bryngelson D Katoria Yetman ?02/07/2022, 12:22 PM ? ? ?

## 2022-02-07 NOTE — Progress Notes (Addendum)
Family Medicine Teaching Service ?Daily Progress Note ?Intern Pager: [phone] ? ?Patient name: Jeffrey [name] Medical record number: [id number] ?Date of birth: 08-07-1966 Age: 56 y.o. Gender: male ? ?Primary Care Provider: [name], MD ?Consultants: GI, Nephrology ?Code Status: Full ? ?Pt Overview and Major Events to Date:  ?4/29: Admitted ?4/30: EGD showed LA grade esophagitis ?5/2: 1u pRBC transfusion ? ?LOS: 6 days ? ?Assessment and Plan: ?Jeffrey [name] is a 57 year old male admitted for melena, abdominal distention, and AKI; concern for GI bleed in the setting of alcoholic cirrhosis. PMH is significant for alcoholic cirrhosis, history of heavy etOH use, hx of erosive esophagitis, T2DM, C-difficile infection, cardiac arrest with anoxic brain injury, seizure disorder, depression, HLD. ? ?AKI on CKD 3a; likely HRS component ?Completed 3 days IV albumin. Kidney function not significantly improved. Cr still elevated at 3.45. Edema seems improved on exam again today. Per nephrology seen while in the room, will pursue diuretic challenge today ?- Continue tamsulosin 0.'4mg'$  daily ?- Diuretic challenge per nephrology, appreciate care (IV Furosemide '80mg'$  x1) ?- Strict I/O ? ?Concern for GI bleed w/ melena/anemia; Erosive esophagitis  ETOH cirrhosis ?Doing well. No further concern for bleeding. Hgb stable at 9.8. ?- PO Protonix '40mg'$  BID indefinitely ?- Will have o/p follow-up with GI for repeat EGD in a few months to ensure mucosal healing ?- IV CTX x7 days for SBP ppx (Start date 4/30) ? ?HTN; portal hypertension ?BP remains persistently elevated in 170s/100s.  ?- Propranolol '40mg'$  TID ?- Start Amlodipine '5mg'$  daily ? ?Uncontrolled T2DM ?CBGs 100s-200s. ?- CBG 4 times daily, at meals and bedtime ?- Sensitive SSI ?- Lantus '10mg'$  daily ? ?FEN/GI: Regular ?PPx: SCDs, no pharmacologic VTE ppx d/t high bleeding risk ?Dispo:Pending PT recommendations, pending clinical improvement. ? ?Subjective:  ?Feeling well. Says his kids  do not want him to come home. Jeffrey [name] doesn't want to go home and have to call EMS when he falls, he wants to go to SNF to build his strength. He has no complaints this morning and was eating sausage and eggs for breakfast. ? ?Objective: ?Temp:  [97.4 ?F (36.3 ?C)-98.3 ?F (36.8 ?C)] 98.3 ?F (36.8 ?C) (05/04 9211) ?Pulse Rate:  [61-80] 72 (05/04 0829) ?Resp:  [5-21] 21 (05/04 0829) ?BP: (150-177)/(96-112) 177/103 (05/04 0829) ?SpO2:  [94 %-98 %] 97 % (05/04 0829) ?Weight:  [121.6 kg] 121.6 kg (05/04 0604) ?Physical Exam: ?General: Sitting upright in bed in no dsitress ?Cardiovascular: RRR, no murmurs ?Respiratory: CTAB, normal work of breathing on room air. ?Abdomen: Soft, NTND, BS x4 ?Extremities: Warm, well-perfused. Chronic woody vascular changes in lower extremities. Improved pitting edema in upper extremities. ? ?Laboratory: ?Recent Labs  ?Lab 02/05/22 ?0145 02/05/22 ?9417 02/05/22 ?1947 02/06/22 ?0228 02/07/22 ?0117  ?WBC 7.4  --   --  7.3 7.9  ?HGB 7.2*   < > 8.2* 8.8* 9.8*  ?HCT 22.1*   < > 25.2* 27.2* 30.2*  ?PLT 379  --   --  352 383  ? < > = values in this interval not displayed.  ? ?Recent Labs  ?Lab 02/03/22 ?0421 02/04/22 ?0316 02/05/22 ?0145 02/06/22 ?0228 02/07/22 ?0117  ?NA 131* 131* 134* 133* 135  ?K 4.7 4.8 4.6 4.6 4.9  ?CL 103 101 105 104 105  ?CO2 20* 22 21* 19* 19*  ?BUN 26* 29* 30* 34* 34*  ?CREATININE 3.57* 3.58* 3.63* 3.58* 3.45*  ?CALCIUM 7.2* 7.6* 7.7* 7.7* 8.1*  ?PROT 5.2* 6.1*  --  5.3*  --   ?BILITOT 0.5  0.3  --  0.4  --   ?ALKPHOS 162* 225*  --  138*  --   ?ALT 27 28  --  18  --   ?AST 26 23  --  21  --   ?GLUCOSE 217* 339* 264* 271* 178*  ? ? ?Imaging/Diagnostic Tests: ?No results found. ? ? ?Jeffrey Brill, DO ?02/07/2022, 8:37 AM ?PGY-1, [place] ?[place] Intern pager: [phone], text pages welcome ? ?

## 2022-02-07 NOTE — NC FL2 (Signed)
?[place] MEDICAID FL2 LEVEL OF CARE SCREENING TOOL  ?  ? ?IDENTIFICATION  ?Patient Name: ?Jeffrey [name] Birthdate: 1966/08/10 Sex: male Admission Date (Current Location): ?02/02/2022  ?[place] and [place] Number: ? [place] ?  Facility and Address:  ?[facility] [place], [place] [address] [place], [place] [address] ?     Provider Number: ?[id number]  ?Attending Physician Name and Address:  ?Zenia Resides, MD ? Relative Name and Phone Number:  ?  ?   ?Current Level of Care: ?Hospital Recommended Level of Care: ?[place] Prior Approval Number: ?  ? ?Date Approved/Denied: ?  PASRR Number: ?[id number] A ? ?Discharge Plan: ?SNF ?  ? ?Current Diagnoses: ?Patient Active Problem List  ? Diagnosis Date Noted  ? Gastroesophageal reflux disease with esophagitis and hemorrhage   ? UTI due to Klebsiella species 01/11/2022  ? Alcohol use   ? Orthostatic hypotension 01/08/2022  ? Iron deficiency anemia due to chronic blood loss   ? Symptomatic anemia   ? Pre-syncope 12/20/2021  ? Hypocalcemia 12/06/2021  ? Demand ischemia ([place]) 09/16/2021  ? Hypothermia 09/13/2021  ? Hypoalbuminemia   ? Recurrent falls 07/29/2021  ? Grief counseling 01/04/2021  ? Incontinence 12/23/2020  ? Hospital discharge follow-up 11/24/2020  ? Syncope 11/17/2020  ? Hypomagnesemia 11/05/2020  ? Hematemesis with nausea   ? Alcohol abuse with intoxication ([place])   ? Duodenal ulcer hemorrhagic   ? GI bleed 10/31/2020  ? Hyperglycemia 10/31/2020  ? DKA (diabetic ketoacidosis) ([place]) 07/22/2020  ? Candidiasis 07/22/2020  ? Hyponatremia 06/28/2020  ? Eyebrow laceration, left, initial encounter   ? Hypokalemia   ? Hyperlipidemia associated with type 2 diabetes mellitus ([place])   ? MGUS (monoclonal gammopathy of unknown significance) 05/31/2020  ? Seizure disorder ([place]) 03/16/2020  ? Type 2 diabetes mellitus without complication, with long-term current use of insulin ([place]) 03/16/2020  ? Tremor 03/16/2020  ? Colon cancer screening  02/22/2020  ? Stage 3a chronic kidney disease ([place]) 01/29/2020  ? Hyperphosphatemia 01/29/2020  ? Major depressive disorder, recurrent episode ([place]) 01/13/2018  ? Weakness   ? Sepsis secondary to UTI [facility])   ? Hypotension 07/27/2017  ? Seizure ([place]) 04/08/2017  ? Proteinuria 11/23/2015  ? Cirrhosis ([place]) 03/22/2015  ? Ataxia 03/20/2015  ? Elevated CK   ? Dysarthria 03/09/2015  ? Acute renal failure ([place])   ? Acute blood loss anemia 11/17/2014  ? ETOH abuse   ? Tachycardia   ? Hematemesis 11/16/2014  ? Melena 10/10/2014  ? Erosive esophagitis 10/10/2014  ? Anemia of chronic disease   ? Somnolence 10/08/2014  ? Acute kidney injury ([place])   ? OSA on CPAP 12/28/2012  ? Type 2 diabetes mellitus with hypoglycemia without coma ([place]) 12/07/2012  ? Cardiac arrest ([place]) 12/07/2012  ? Anoxic brain injury ([place]) 12/07/2012  ? ERECTILE DYSFUNCTION 04/14/2007  ? Type 2 diabetes mellitus with diabetic neuropathy, with long-term current use of insulin ([place]) 12/04/2006  ? HYPERCHOLESTEROLEMIA 12/04/2006  ? ? ?Orientation RESPIRATION BLADDER Height & Weight   ?  ?Self, Time, Situation ? Normal Continent Weight: 268 lb 1.3 oz (121.6 kg) ?Height:  '5\' 10"'$  (177.8 cm)  ?BEHAVIORAL SYMPTOMS/MOOD NEUROLOGICAL BOWEL NUTRITION STATUS  ?    Continent Diet (please see discharge summary)  ?AMBULATORY STATUS COMMUNICATION OF NEEDS Skin   ?Supervision Verbally  (wound/incision diabetic ulcer toe, anterior right and left) ?  ?  ?  ?    ?     ?     ? ? ?  Personal Care Assistance Level of Assistance  ?Bathing, Feeding, Dressing Bathing Assistance: Limited assistance ?Feeding assistance: Independent ?Dressing Assistance: Limited assistance ?   ? ?Functional Limitations Info  ?Sight, Hearing, Speech Sight Info: Impaired ?Hearing Info: Impaired ?Speech Info: Adequate  ? ? ?SPECIAL CARE FACTORS FREQUENCY  ?PT (By licensed PT), OT (By licensed OT)   ?  ?PT Frequency: 5x per week ?OT Frequency: 5x per week ?  ?  ?  ?   ? ? ?Contractures Contractures Info: Not  present  ? ? ?Additional Factors Info  ?Code Status, Allergies Code Status Info: FULL ?Allergies Info: Atorvastatin,Lisinopril,Losartan,Drug Ingredient spironolactone ?  ?  ?  ?   ? ?Current Medications (02/07/2022):  This is the current hospital active medication list ?Current Facility-Administered Medications  ?Medication Dose Route Frequency Provider Last Rate Last Admin  ? amLODipine (NORVASC) tablet 5 mg  5 mg Oral Daily Gerrit Heck, MD      ? cefTRIAXone (ROCEPHIN) 1 g in sodium chloride 0.9 % 100 mL IVPB  1 g Intravenous Q24H Hammons, Kimberly B, RPH 200 mL/hr at 02/07/22 0828 1 g at 02/07/22 0828  ? feeding supplement (BOOST / RESOURCE BREEZE) liquid 1 Container  1 Container Oral TID BM Zenia Resides, MD   1 Container at 02/06/22 2255  ? furosemide (LASIX) injection 80 mg  80 mg Intravenous Once Zola Button, MD      ? hydrocortisone (CORTEF) tablet 10 mg  10 mg Oral q AM Gerrit Heck, MD   10 mg at 02/07/22 0700  ? And  ? hydrocortisone (CORTEF) tablet 5 mg  5 mg Oral QPM Gerrit Heck, MD   5 mg at 02/06/22 1722  ? insulin aspart (novoLOG) injection 0-9 Units  0-9 Units Subcutaneous TID WC Orvis Brill, DO   2 Units at 02/07/22 5284  ? insulin glargine-yfgn [facility]) injection 10 Units  10 Units Subcutaneous Daily Orvis Brill, DO   10 Units at 02/07/22 1324  ? levETIRAcetam (KEPPRA) tablet 500 mg  500 mg Oral BID Ezequiel Essex, MD   500 mg at 02/07/22 4010  ? mirtazapine (REMERON) tablet 30 mg  30 mg Oral QHS Eppie Gibson, MD   30 mg at 02/06/22 2306  ? multivitamin with minerals tablet 1 tablet  1 tablet Oral Daily Zenia Resides, MD   1 tablet at 02/07/22 2725  ? pantoprazole (PROTONIX) EC tablet 40 mg  40 mg Oral BID Eppie Gibson, MD   40 mg at 02/07/22 3664  ? polyethylene glycol (MIRALAX / GLYCOLAX) packet 17 g  17 g Oral Daily Dameron, Marisa, DO      ? propranolol (INDERAL) tablet 40 mg  40 mg Oral TID Gerrit Heck, MD   40 mg at 02/07/22 4034  ? rosuvastatin  (CRESTOR) tablet 5 mg  5 mg Oral Daily Eppie Gibson, MD   5 mg at 02/07/22 7425  ? tamsulosin (FLOMAX) capsule 0.4 mg  0.4 mg Oral Daily Gerrit Heck, MD   0.4 mg at 02/07/22 9563  ? thiamine tablet 100 mg  100 mg Oral QHS Eppie Gibson, MD   100 mg at 02/06/22 2255  ? ? ? ?Discharge Medications: ?Please see discharge summary for a list of discharge medications. ? ?Relevant Imaging Results: ? ?Relevant Lab Results: ? ? ?Additional Information ?SSN [id number]   Moderna COVID-19 Vaccine 01/21/2020 , 12/24/2019 ? ?Vinie Sill, LCSW ? ? ? ? ?

## 2022-02-07 NOTE — Progress Notes (Addendum)
Bladder scan delayed d/t other interventions occurring and pt continuing to refuse to get back in bed. Bladder scan was performed in the recliner, with back reclined as far as possible. Volume was 161 mL. Unsure of accuracy. Will re-scan when pt is back in bed. ? ?Raelyn Number, RN ? ?

## 2022-02-07 NOTE — Progress Notes (Signed)
Notified by CCMD pt had a 12 beat run of Vtach. Nurse paged FMTS and informed them of event. Will continue to monitor. ? ?Raelyn Number, RN ? ?

## 2022-02-07 NOTE — Progress Notes (Signed)
Refuses CPAP 

## 2022-02-08 ENCOUNTER — Other Ambulatory Visit: Payer: Self-pay

## 2022-02-08 LAB — BASIC METABOLIC PANEL
Anion gap: 7 (ref 5–15)
BUN: 38 mg/dL — ABNORMAL HIGH (ref 6–20)
CO2: 22 mmol/L (ref 22–32)
Calcium: 7.7 mg/dL — ABNORMAL LOW (ref 8.9–10.3)
Chloride: 107 mmol/L (ref 98–111)
Creatinine, Ser: 3.5 mg/dL — ABNORMAL HIGH (ref 0.61–1.24)
GFR, Estimated: 20 mL/min — ABNORMAL LOW (ref >60)
Glucose, Bld: 226 mg/dL — ABNORMAL HIGH (ref 70–99)
Potassium: 4.8 mmol/L (ref 3.5–5.1)
Sodium: 136 mmol/L (ref 135–145)

## 2022-02-08 LAB — GLUCOSE, CAPILLARY
Glucose-Capillary: 148 mg/dL — ABNORMAL HIGH (ref 70–99)
Glucose-Capillary: 63 mg/dL — ABNORMAL LOW (ref 70–99)
Glucose-Capillary: 82 mg/dL (ref 70–99)
Glucose-Capillary: 101 mg/dL — ABNORMAL HIGH (ref 70–99)
Glucose-Capillary: 120 mg/dL — ABNORMAL HIGH (ref 70–99)

## 2022-02-08 LAB — CBC
HCT: 24.9 % — ABNORMAL LOW (ref 39.0–52.0)
Hemoglobin: 8.5 g/dL — ABNORMAL LOW (ref 13.0–17.0)
MCH: 29 pg (ref 26.0–34.0)
MCHC: 34.1 g/dL (ref 30.0–36.0)
MCV: 85 fL (ref 80.0–100.0)
Platelets: 312 10*3/uL (ref 150–400)
RBC: 2.93 MIL/uL — ABNORMAL LOW (ref 4.22–5.81)
RDW: 19.9 % — ABNORMAL HIGH (ref 11.5–15.5)
WBC: 7.8 10*3/uL (ref 4.0–10.5)
nRBC: 0 % (ref 0.0–0.2)

## 2022-02-08 LAB — HEMOGLOBIN AND HEMATOCRIT, BLOOD
HCT: 25.6 % — ABNORMAL LOW (ref 39.0–52.0)
Hemoglobin: 8.3 g/dL — ABNORMAL LOW (ref 13.0–17.0)

## 2022-02-08 MED ORDER — FUROSEMIDE 10 MG/ML IJ SOLN
80.0000 mg | Freq: Two times a day (BID) | INTRAMUSCULAR | Status: AC
  Administered 2022-02-08 (×2): 80 mg via INTRAVENOUS
  Filled 2022-02-08 (×2): qty 8

## 2022-02-08 MED ORDER — INSULIN ASPART 100 UNIT/ML IJ SOLN: 0.0000 [IU] | Freq: Three times a day (TID) | INTRAMUSCULAR | Status: DC

## 2022-02-08 MED ORDER — INSULIN GLARGINE-YFGN 100 UNIT/ML ~~LOC~~ SOLN
5.0000 [IU] | Freq: Every day | SUBCUTANEOUS | Status: DC
  Filled 2022-02-08: qty 0.05

## 2022-02-08 NOTE — Progress Notes (Signed)
Patient refuses CPAP 

## 2022-02-08 NOTE — Progress Notes (Signed)
Hypoglycemic Event ? ?CBG: 63 ? ?Treatment: 4 oz juice/soda ? ?Symptoms: None ? ?Follow-up CBG: SLHT:3428 CBG Result:82 ? ? ?Clyde Canterbury, RN ? ? ?

## 2022-02-08 NOTE — TOC Progression Note (Signed)
Transition of Care (TOC) - Progression Note  ? ? ?Patient Details  ?Name: Jeffrey [name] ?MRN: [id number] ?Date of Birth: 1966-03-21 ? ?Transition of Care (TOC) CM/SW Contact  ?Vinie Sill, LCSW ?Phone Number: ?02/08/2022, 2:56 PM ? ?Clinical Narrative:    ? ?CSW provided patient with bed offers- patient choose [facility]([place]) ? ?CSW ifnormed SNF patient accepted bed offer- SNF will start the authorization process today. ? ?TOC will continue to follow and assist with discharge planning. ? ?Thurmond Butts, MSW, LCSW ?Clinical Social Worker ? ? ? ?  ?Barriers to Discharge: Insurance Authorization ? ?Expected Discharge Plan and Services ?  ?In-house Referral: Clinical Social Work ?  ?  ?Living arrangements for the past 2 months: [place] ?                ?  ?  ?  ?  ?  ?  ?  ?  ?  ?  ? ? ?Social Determinants of Health (SDOH) Interventions ?  ? ?Readmission Risk Interventions ? ?  12/21/2021  ?  3:10 PM 09/17/2021  ? 12:59 PM 08/01/2021  ?  1:24 PM  ?Readmission Risk Prevention Plan  ?Transportation Screening Complete Complete Complete  ?PCP or Specialist Appt within 5-7 Days   Complete  ?PCP or Specialist Appt within 3-5 Days Complete Complete   ?Home Care Screening   Patient refused  ?Medication Review (RN CM)   Complete  ?[place] or Home Care Consult Complete Complete   ?Social Work Consult for [place] Planning/Counseling Complete Complete   ?Palliative Care Screening Not Applicable Not Applicable   ?Medication Review Press photographer) Complete Complete   ? ? ?

## 2022-02-08 NOTE — Progress Notes (Signed)
Nutrition Follow-up ? ?DOCUMENTATION CODES:  ? ?Not applicable ? ?INTERVENTION:  ? ?High protein snacks BID between meals (afternoon and bedtime). ? ?MVI with minerals daily. ? ?D/C Boost Breeze. ? ?NUTRITION DIAGNOSIS:  ? ?Increased nutrient needs related to wound healing, chronic illness (alcoholic cirrhosis) as evidenced by estimated needs. ? ?Ongoing  ? ?GOAL:  ? ?Patient will meet greater than or equal to 90% of their needs ? ?Progressing  ? ?MONITOR:  ? ?PO intake, Supplement acceptance, Labs, Skin ? ?REASON FOR ASSESSMENT:  ? ?Consult ?Assessment of nutrition requirement/status, Poor PO ? ?ASSESSMENT:  ? ?55 yo male admitted with GI bleed, decompensated alcoholic cirrhosis with ascites. PMH includes alcoholic cirrhosis, heavy ETOH use, erosive esophagitis, DM-2, cardiac arrest with anoxic brain injury, seizure disorder, HLD, CHF. ? ?Patient is being treated for possible ATN vs hepatorenal syndrome.  ?S/P EGD 4/30. Results showed severe esophagitis, mild portal hypertensive gastropathy, and multiple non bleeding duodenal ulcers. ? ?Spoke with patient at bedside. ?He has been eating okay, but thinks he needs additional food.  ?He does not want to receive supplements.  ?His blood sugars have been low for the past few days. ?He agreed to receive afternoon and bedtime snacks daily. ? ?Labs reviewed.  ?CBG: [id number] ? ?Medications reviewed and include Lasix, Novolog, Semglee, Keppra, Remeron, MVI with minerals, Flomax, thiamine. ? ?NUTRITION - FOCUSED PHYSICAL EXAM: ? ?Flowsheet Row Most Recent Value  ?Orbital Region No depletion  ?Upper Arm Region Mild depletion  ?Thoracic and Lumbar Region No depletion  ?Buccal Region No depletion  ?Temple Region No depletion  ?Clavicle Bone Region Mild depletion  ?Clavicle and Acromion Bone Region No depletion  ?Scapular Bone Region No depletion  ?Dorsal Hand Mild depletion  ?Patellar Region No depletion  ?Anterior Thigh Region No depletion  ?Posterior Calf Region  No depletion  ?Edema (RD Assessment) Moderate  ?Hair Reviewed  ?Eyes Reviewed  ?Mouth Reviewed  ?Skin Reviewed  ?Nails Reviewed  ? ?  ? ? ?Diet Order:   ?Diet Order   ? ?       ?  Diet 2 gram sodium Room service appropriate? Yes; Fluid consistency: Thin  Diet effective now       ?  ? ?  ?  ? ?  ? ? ?EDUCATION NEEDS:  ? ?Not appropriate for education at this time ? ?Skin:  Skin Assessment: Skin Integrity Issues: ?Skin Integrity Issues:: Diabetic Ulcer ?Diabetic Ulcer: great & second toes on both feet ? ?Last BM:  5/5 type 4 ? ?Height:  ? ?Ht Readings from Last 1 Encounters:  ?02/02/22 '5\' 10"'$  (1.778 m)  ? ? ?Weight:  ? ?Wt Readings from Last 1 Encounters:  ?02/08/22 121.9 kg  ? ? ?Ideal Body Weight:  75.5 kg ? ?BMI:  Body mass index is 38.56 kg/m?. ? ?Estimated Nutritional Needs:  ? ?Kcal:  2200-2400 ? ?Protein:  100-120 gm ? ?Fluid:  2 L ? ? ? ?Lucas Mallow RD, LDN, CNSC ?Please refer to Amion for contact information.                                                       ? ?

## 2022-02-08 NOTE — Progress Notes (Addendum)
FPTS Brief Progress Note ? ?S:Went to room to see patient, patient resting comfortably, did not disturb. Noted drop in hgb on am labs from 9.8 to 8.5, nurse notes patient had normal looking bowel movement, no blood/not dark, no emesis.  ? ? ?O: ?BP (!) 155/97 (BP Location: Left Arm)   Pulse 74   Temp 97.8 ?F (36.6 ?C) (Oral)   Resp 18   Ht '5\' 10"'$  (1.778 m)   Wt 121.6 kg   SpO2 99%   BMI 38.47 kg/m?   ? ? ?A/P: ?Drop in Hgb ?Patient noted to have drop in Hgb with no signs of bleeding. Will obtain repeat lab and continue to monitor. Vitals stable. ?-H&H ? ?Plans per day team ?- Orders reviewed. Labs for AM ordered, which was adjusted as needed.  ? ? ?Holley Bouche, MD ?02/08/2022, 3:08 AM ?PGY-1, [place] Medicine Night Resident  ?Please page [phone] with questions.  ? ? ?

## 2022-02-08 NOTE — Progress Notes (Addendum)
?[name] ?Progress Note  ? ? ?Assessment/ Plan:   ?Jeffrey [name] is a 56 y.o. male with history of alcoholic cirrhosis, R0QT, CKD IIIa, adrenal insufficiency, PEA arrest with anoxic brain injury, presenting with melena concerning for GI bleed.  Hospital course complicated by AKI superimposed on CKD. ? ?1.  Oliguric AKI superimposed on CKD IIIa - Likely more ATN-mediated rather than HRS given lack of response to albumin. Urine sediment personally evaluated notable for many non-dysmorphic RBCs and granular casts which is suggestive of ATN; GN is less likely with normal appearing RBCs. He has had fair urine output with diuretic trial yesterday. ?- continue diuresis with IV furosemide 80 mg BID today ?- may need renal biopsy at some point, but not during this hospitalization ?- needs urology outpatient follow-up for chronic hematuria ?2.  Nephrotic range proteinuria - AKI is likely contributing, suspect also related to underlying diabetic kidney disease. Elevated light chains with normal ratio, non-specific. ?- SPEP pending for myeloma/amyloidosis (though lower suspicion) ?3.  Hypoalbuminemia - significant 3rd spacing on exam, likely secondary to underlying cirrhosis but concomitant nephrotic syndrome is a possibility ?4.  Urinary retention - No obstruction seen on renal US. Improving with tamsulosin, bladder scans appropriate. ?5.  Cirrhosis with ascites - s/p low volume paracentesis 4/29, per primary ?6.  HTN - improving on propranolol, started on amlodipine per primary ?7.  T2DM - per primary ?8.  Adrenal insufficiency ? ?Remainder per primary ? ?Subjective:   ?Still feels some difficulty with urination but feels it is improving. ? ?Urine output 1550 ml (0.5 ml/kg/hr) with 3 unmeasured voids in the past 24 hours. Bladder scans 161, 47 yesterday.  ? ?Objective:   ?BP (!) 153/90 (BP Location: Left Arm)   Pulse 70   Temp 97.8 ?F (36.6 ?C) (Oral)   Resp 18   Ht '5\' 10"'$  (1.778 m)   Wt 121.9 kg    SpO2 97%   BMI 38.56 kg/m?  ? ?Intake/Output Summary (Last 24 hours) at 02/08/2022 0754 ?Last data filed at 02/08/2022 0453 ?Gross per 24 hour  ?Intake 550 ml  ?Output 1551 ml  ?Net -1001 ml  ? ? ?Weight change: 0.3 kg ? ?Physical Exam: ?Gen: Alert, sitting up eating breakfast, NAD ?CVS: RRR, no murmurs ?Resp: Clear to auscultation bilaterally ?Abd: Soft, nontender ?Ext: 2-3+ pitting edema bilateral lower extremities up to the proximal thighs, 1+ pitting edema in bilateral forearms ? ?Imaging: ?No results found. ? ?Labs: ?BMET ?Recent Labs  ?Lab 02/02/22 ?6226 02/03/22 ?0421 02/04/22 ?3335 02/05/22 ?0145 02/06/22 ?4562 02/07/22 ?5638 02/08/22 ?9373  ?NA 129* 131* 131* 134* 133* 135 136  ?K 4.4 4.7 4.8 4.6 4.6 4.9 4.8  ?CL 102 103 101 105 104 105 107  ?CO2 15* 20* 22 21* 19* 19* 22  ?GLUCOSE 541* 217* 339* 264* 271* 178* 226*  ?BUN 28* 26* 29* 30* 34* 34* 38*  ?CREATININE 3.59* 3.57* 3.58* 3.63* 3.58* 3.45* 3.50*  ?CALCIUM 6.9* 7.2* 7.6* 7.7* 7.7* 8.1* 7.7*  ? ? ?CBC ?Recent Labs  ?Lab 02/02/22 ?4287 02/03/22 ?0421 02/05/22 ?0145 02/05/22 ?6811 02/06/22 ?5726 02/07/22 ?2035 02/08/22 ?5974 02/08/22 ?1638  ?WBC 9.4   < > 7.4  --  7.3 7.9 7.8  --   ?NEUTROABS 6.2  --   --   --   --   --   --   --   ?HGB 8.1*   < > 7.2*   < > 8.8* 9.8* 8.5* 8.3*  ?HCT 24.9*   < >  22.1*   < > 27.2* 30.2* 24.9* 25.6*  ?MCV 85.6   < > 84.4  --  85.5 86.8 85.0  --   ?PLT 378   < > 379  --  352 383 312  --   ? < > = values in this interval not displayed.  ? ? ? ?Medications:   ? ? amLODipine  5 mg Oral Daily  ? feeding supplement  1 Container Oral TID BM  ? hydrocortisone  10 mg Oral q AM  ? And  ? hydrocortisone  5 mg Oral QPM  ? insulin aspart  0-9 Units Subcutaneous TID WC  ? insulin glargine-yfgn  10 Units Subcutaneous Daily  ? levETIRAcetam  500 mg Oral BID  ? mirtazapine  30 mg Oral QHS  ? multivitamin with minerals  1 tablet Oral Daily  ? pantoprazole  40 mg Oral BID  ? polyethylene glycol  17 g Oral Daily  ? propranolol  40 mg Oral TID   ? rosuvastatin  5 mg Oral Daily  ? tamsulosin  0.4 mg Oral Daily  ? thiamine  100 mg Oral QHS  ? ? ? ? ?Zola Button, MD ?[place], PGY-2  ?

## 2022-02-08 NOTE — Progress Notes (Signed)
Family Medicine Teaching Service ?Daily Progress Note ?Intern Pager: [phone] ? ?Patient name: Jeffrey [name] Medical record number: [id number] ?Date of birth: 03-02-1966 Age: 56 y.o. Gender: male ? ?Primary Care Provider: [name], MD ?Consultants: GI, Nephrology ?Code Status: Full ? ?Pt Overview and Major Events to Date:  ?4/29: Admitted ?4/30: EGD with LA grade D esophagitis ?5/2: 1u pRBC transfused ? ?LOS: 7 days ? ?Assessment and Plan: ?Jeffrey [name] is a 56 year old male admitted for melena, abdominal distention, and AKI; concern for GI bleed in the setting of alcoholic cirrhosis. PMH is significant for alcoholic cirrhosis, history of heavy etOH use, hx of erosive esophagitis, T2DM, C-difficile infection, cardiac arrest with anoxic brain injury, seizure disorder, depression, HLD. ?  ?AKI on CKD 3a ?Started diuresis challenge yesterday, s/p IV lasix '80mg'$  x1 without much improvement in Creatinine, 3.50 today. UOP 1.55L in past 24 hours. Net positive 12.8 since admission. Possible ATN vs. Hepatorenal syndrome ?- Continue Tamsulosin ?- Nephrology reccs, appreciate care ?- Strict I/O ? ?Chronic anemia; Erosive esophagitis  ETOH cirrhosis ?Hgb downtrended from 9.8 to 8.5 this morning. No source of bleeding- no melena, hematemesis, hematuria. Asymptomatic. Baseline Hgb around 8-9. ?- PO protonix '40mg'$  BID indefinitely ?- O/p GI f/u for repeat EGD in a few months to ensure mucosal healing ?- IV CTX x7 days for SBP ppx (start 4/30) ? ?HTN/ Portal  HTN ?BP improved in 130s-150s/80s-90s. ?- Continue Propranolol '40mg'$  TID ?- Continue Amlodipine '5mg'$  daily ? ?Non-sustained ventricular tachycardia ?NSR this morning. Overnight, had 12 beat run. Asymptomatic ?- Cardiac monitoring ? ?Uncontrolled T2DM ?Hypoglycemic overnight to 53, repeat 50. Resolved to 125. CBG 148 this morning. ?- CBG monitoring 4 times daily, at meals and bedtime ?- Discontinue sensitive SSI, Start very sensitive SSI ?- Decrease Lantus to '5mg'$   daily ? ?FEN/GI: Regular ?PPx: SCDs; no pharmacologic VTE ppx d/t high bleeding risk ?Dispo:SNF pending clinical improvement . Barriers include continued inpatient workup.  ? ?Subjective:  ?Feeling well. No complaints. No chest pain, abdominal pain. Says he "feels good even though y'all tell me I got a lot going on." Wanted to know what his labs showed this morning. Denies blood in urine, stool. Eating and drinking well. ? ?Objective: ?Temp:  [97.3 ?F (36.3 ?C)-98.5 ?F (36.9 ?C)] 97.4 ?F (36.3 ?C) (05/05 0800) ?Pulse Rate:  [60-77] 76 (05/05 0800) ?Resp:  [16-20] 20 (05/05 0800) ?BP: (130-163)/(84-103) 148/87 (05/05 0800) ?SpO2:  [97 %-100 %] 98 % (05/05 0800) ?Weight:  [121.9 kg] 121.9 kg (05/05 0453) ?Physical Exam: ?General: Laying in bed in no distress ?Cardiovascular: RRR, no murmurs ?Respiratory: Clear in all fields. Normal WOB on room air. ?Abdomen: Soft, non-tender, mildly distended, BS x4 ?Extremities: Warm, well-perfused. Chronic woody vascular changes in lower extremities. Pitting edema in upper extremities to elbow ? ?Laboratory: ?Recent Labs  ?Lab 02/06/22 ?0228 02/07/22 ?5427 02/08/22 ?0623 02/08/22 ?7628  ?WBC 7.3 7.9 7.8  --   ?HGB 8.8* 9.8* 8.5* 8.3*  ?HCT 27.2* 30.2* 24.9* 25.6*  ?PLT 352 383 312  --   ? ?Recent Labs  ?Lab 02/03/22 ?0421 02/04/22 ?0316 02/05/22 ?0145 02/06/22 ?0228 02/07/22 ?3151 02/08/22 ?7616  ?NA 131* 131*   < > 133* 135 136  ?K 4.7 4.8   < > 4.6 4.9 4.8  ?CL 103 101   < > 104 105 107  ?CO2 20* 22   < > 19* 19* 22  ?BUN 26* 29*   < > 34* 34* 38*  ?CREATININE 3.57* 3.58*   < > 3.58*  3.45* 3.50*  ?CALCIUM 7.2* 7.6*   < > 7.7* 8.1* 7.7*  ?PROT 5.2* 6.1*  --  5.3*  --   --   ?BILITOT 0.5 0.3  --  0.4  --   --   ?ALKPHOS 162* 225*  --  138*  --   --   ?ALT 27 28  --  18  --   --   ?AST 26 23  --  21  --   --   ?GLUCOSE 217* 339*   < > 271* 178* 226*  ? < > = values in this interval not displayed.  ? ? ?Imaging/Diagnostic Tests: ?No results found. ? ? ?Orvis Brill, DO ?02/08/2022,  9:12 AM ?PGY-1, [place] ?[place] [address] [place] Intern pager: [phone], text pages welcome ? ?

## 2022-02-08 NOTE — Progress Notes (Signed)
Mobility Specialist Progress Note ? ? 02/08/22 1230  ?Mobility  ?Activity Ambulated with assistance in hallway  ?Level of Assistance Standby assist, set-up cues, supervision of patient - no hands on  ?Assistive Device Front wheel walker  ?Distance Ambulated (ft) 562 ft  ?Activity Response Tolerated well  ?$Mobility charge 1 Mobility  ? ?Pre Mobility:72 HR, 150/96 BP  ?During Mobility: 104 HR ?Post Mobility: 84 HR, 163/106 BP ? ?Received on EOB w/ no physical complaint and agreeable to mobility. Asymptomatic and no faults throughout ambulation. Returned to chair w/ alarm on and call bell in reach.  ? ?Holland Falling ?Mobility Specialist ?Phone Number [phone] ? ?

## 2022-02-09 LAB — CBC
HCT: 27.3 % — ABNORMAL LOW (ref 39.0–52.0)
Hemoglobin: 9 g/dL — ABNORMAL LOW (ref 13.0–17.0)
MCH: 28.5 pg (ref 26.0–34.0)
MCHC: 33 g/dL (ref 30.0–36.0)
MCV: 86.4 fL (ref 80.0–100.0)
Platelets: 325 10*3/uL (ref 150–400)
RBC: 3.16 MIL/uL — ABNORMAL LOW (ref 4.22–5.81)
RDW: 20.1 % — ABNORMAL HIGH (ref 11.5–15.5)
WBC: 9.7 10*3/uL (ref 4.0–10.5)
nRBC: 0 % (ref 0.0–0.2)

## 2022-02-09 LAB — GLUCOSE, CAPILLARY
Glucose-Capillary: 93 mg/dL (ref 70–99)
Glucose-Capillary: 115 mg/dL — ABNORMAL HIGH (ref 70–99)
Glucose-Capillary: 108 mg/dL — ABNORMAL HIGH (ref 70–99)
Glucose-Capillary: 302 mg/dL — ABNORMAL HIGH (ref 70–99)
Glucose-Capillary: 291 mg/dL — ABNORMAL HIGH (ref 70–99)

## 2022-02-09 LAB — BASIC METABOLIC PANEL
Anion gap: 8 (ref 5–15)
BUN: 38 mg/dL — ABNORMAL HIGH (ref 6–20)
CO2: 21 mmol/L — ABNORMAL LOW (ref 22–32)
Calcium: 7.8 mg/dL — ABNORMAL LOW (ref 8.9–10.3)
Chloride: 107 mmol/L (ref 98–111)
Creatinine, Ser: 3.57 mg/dL — ABNORMAL HIGH (ref 0.61–1.24)
GFR, Estimated: 19 mL/min — ABNORMAL LOW (ref >60)
Glucose, Bld: 127 mg/dL — ABNORMAL HIGH (ref 70–99)
Potassium: 4.6 mmol/L (ref 3.5–5.1)
Sodium: 136 mmol/L (ref 135–145)

## 2022-02-09 MED ORDER — FUROSEMIDE 40 MG PO TABS
40.0000 mg | ORAL_TABLET | Freq: Every day | ORAL | Status: DC
  Administered 2022-02-09 – 2022-02-10 (×2): 40 mg via ORAL
  Filled 2022-02-09 (×2): qty 1

## 2022-02-09 NOTE — Progress Notes (Signed)
Family Medicine Teaching Service ?Daily Progress Note ?Intern Pager: [phone] ? ?Patient name: Jeffrey [name] Medical record number: [id number] ?Date of birth: [birth year] Age: 56 y.o. Gender: male ? ?Primary Care Provider: [name], MD ?Consultants: GI, Nephrology ?Code Status: Full ? ?Pt Overview and Major Events to Date:  ?4/29: Admitted ?4/30: EGD with LA grade D esophagitis ?5/2: 1u pRBC transfused ?  ? ?Assessment and Plan: ? ?Mr. Dowson is a 56 year old male admitted for melena, abdominal distention, and AKI; concern for GI bleed in the setting of alcoholic cirrhosis. PMH is significant for alcoholic cirrhosis, history of heavy etOH use, hx of erosive esophagitis, T2DM, C-difficile infection, cardiac arrest with anoxic brain injury, seizure disorder, depression, HLD. ?  ?AKI on CKD 3a ?Patient had diuresis challenge yesterday status post IV Lasix 80 mg, without improvement in creatinine. Creatinine 3.57 today from 3.5 yesterday.  UOP 1.950 L for past 24 hours. Urine microscopy yesterday notable for many non-dysmorphic RBCs and granular casts which is suggestive of ATN. Impaired renal function may more likely be secondary to ATN vs. hepatorenal syndrome.  ?- Appreciate nephrology recs ?- Strict I's/O ?- Continue tamsulosin ?- Consider biopsy in outpatient setting ? ? ?Chronic anemia; Erosive esophagitis  ETOH cirrhosis ?Hemoglobin improved at 9.0 (8.3) this morning, baseline hemoglobin around 8-9, no concern for bleed at this time.  ?- P.o. Protonix 40 mg twice daily indefinitely ?- O/P GI F/U for repeat EGD in few months to ensure mucosal healing ?- IV CTX x7 days for SBP Ppx Day 7/7 ? ?HTN/ Portal  HTN ?BP well controlled, range 140's/80-90's ?-Continue propranolol 40 mg 3 times daily ?-Continue amlodipine 5 mg daily ? ?Uncontrolled T2DM ?Hypoglycemic yesterday to 63, resolved to 82. CBG range 80's-120's, all insulin held yesterday post hypoglycemic event.  ?-Continue holding insulin ?-CBG monitoring  4 times daily, before meals and nightly ? ? ?FEN/GI: Regular diet ?PPx: SCDs, no pharmacologic VTE PPx D/T high bleeding risk ?Dispo:SNF pending clinical improvement .  ? ?Subjective:  ?Patient feeling well with no complaints this morning, spoke about plans to go to SNF after hospitalization. ? ?Objective: ?Temp:  [97.2 ?F (36.2 ?C)-98.1 ?F (36.7 ?C)] 97.6 ?F (36.4 ?C) (05/06 0436) ?Pulse Rate:  [59-76] 66 (05/06 0436) ?Resp:  [10-20] 10 (05/06 0436) ?BP: (124-148)/(71-95) 143/83 (05/06 0436) ?SpO2:  [98 %-100 %] 100 % (05/06 0436) ?Physical Exam: ?General: Obese, non-ill appearing, NAD, African American male ?Cardiovascular: RRR, NRMG ?Respiratory: CTABL, normal WOB ?Abdomen: Soft, NT, mildly distended ?Extremities: Warm, well-perfused, pitting edema and lower extremities bilaterally in upper extremities bilaterally ? ? ?Laboratory: ?Recent Labs  ?Lab 02/06/22 ?0228 02/07/22 ?9562 02/08/22 ?1308 02/08/22 ?6578  ?WBC 7.3 7.9 7.8  --   ?HGB 8.8* 9.8* 8.5* 8.3*  ?HCT 27.2* 30.2* 24.9* 25.6*  ?PLT 352 383 312  --   ? ?Recent Labs  ?Lab 02/03/22 ?0421 02/04/22 ?0316 02/05/22 ?0145 02/06/22 ?0228 02/07/22 ?4696 02/08/22 ?0228 02/09/22 ?0240  ?NA 131* 131*   < > 133* 135 136 136  ?K 4.7 4.8   < > 4.6 4.9 4.8 4.6  ?CL 103 101   < > 104 105 107 107  ?CO2 20* 22   < > 19* 19* 22 21*  ?BUN 26* 29*   < > 34* 34* 38* 38*  ?CREATININE 3.57* 3.58*   < > 3.58* 3.45* 3.50* 3.57*  ?CALCIUM 7.2* 7.6*   < > 7.7* 8.1* 7.7* 7.8*  ?PROT 5.2* 6.1*  --  5.3*  --   --   --   ?  BILITOT 0.5 0.3  --  0.4  --   --   --   ?ALKPHOS 162* 225*  --  138*  --   --   --   ?ALT 27 28  --  18  --   --   --   ?AST 26 23  --  21  --   --   --   ?GLUCOSE 217* 339*   < > 271* 178* 226* 127*  ? < > = values in this interval not displayed.  ? ? ? ? ?Imaging/Diagnostic Tests: ? ? ?Holley Bouche, MD ?02/09/2022, 5:48 AM ?PGY-1, [facility] ?[place] Intern pager: [phone], text pages welcome ? ?

## 2022-02-09 NOTE — Progress Notes (Signed)
Family Medicine Teaching Service ?Daily Progress Note ?Intern Pager: [phone] ? ?Patient name: Jeffrey [name] Medical record number: [id number] ?Date of birth: 03-04-1966 Age: 56 y.o. Gender: male ? ?Primary Care Provider: [name], MD ?Consultants: GI, Nephrology ?Code Status: Full ? ?Pt Overview and Major Events to Date:  ?4/29: Admitted ?4/30: EGD with LA grade D esophagitis ?5/2: 1u pRBC transfused ?  ? ?Assessment and Plan: ? ?Mr. Rubens is a 56 year old male admitted for melena, abdominal distention, and AKI; concern for GI bleed in the setting of alcoholic cirrhosis. PMH is significant for alcoholic cirrhosis, history of heavy etOH use, hx of erosive esophagitis, T2DM, C-difficile infection, cardiac arrest with anoxic brain injury, seizure disorder, depression, HLD. ?  ?AKI on CKD 3a ?Patient diuresed 1.3 liters the last 24 hours.  Creatinine today 3.25 from 3.57 yesterday.  Patient did have urine notable for nondysmorphic RBCs and granular casts suggestive of ATN. ?- Nephrology consulted, appreciate their assistance ?- Strict I's and O's ?- Continue tamsulosin ?- Consider biopsy in the outpatient setting ? ?Chronic anemia; Erosive esophagitis  ETOH cirrhosis ?Hemoglobin 9.2 from 9.0 yesterday.  Baseline hemoglobin is between 8 and 9. ?- Continue p.o. Protonix 40 mg twice daily indefinitely ?- Outpatient GI follow-up with repeat EGD in a few months to ensure mucosal healing ?- Patient has completed IV ceftriaxone regimen, it can now be discontinued ? ?HTN/ Portal  HTN ?BP over the last 24 hours ranging from 135/84-143/83. ?- Orthostatic vital signs ordered ?- Continue propranolol 40 mg 3 times daily ?- Continue amlodipine 5 mg daily ? ?Uncontrolled T2DM ?Patient blood sugars over the last 24 hours have ranged from 93-302. ?-Holding home insulin ?- Very sensitive SSI ?- Continue CBG monitoring ? ? ?FEN/GI: Regular diet ?PPx: SCDs, no pharmacologic VTE PPx D/T high bleeding risk ?Dispo:SNF pending  clinical improvement .  ? ?Subjective:  ?Patient reports he is doing well at this time.  No concerns but is ready to go to rehab facility. ? ?Objective: ?Temp:  [97.6 ?F (36.4 ?C)-98.3 ?F (36.8 ?C)] 97.6 ?F (36.4 ?C) (05/06 2355) ?Pulse Rate:  [68-71] 70 (05/06 2355) ?Resp:  [14-16] 16 (05/07 0300) ?BP: (135-144)/(83-87) 143/83 (05/06 2355) ?SpO2:  [97 %] 97 % (05/06 2015) ?Weight:  [121 kg] 121 kg (05/07 0300) ?Physical Exam: ?General: Obese, well-appearing African-American male in no acute distress ?Cardiovascular: Regular rate and rhythm, no murmurs appreciated ?Respiratory: Clear to auscultation bilaterally, normal work of breathing ?Abdomen: Soft, nontender, positive bowel sounds ?Extremities: Pitting edema present in lower extremities bilaterally ? ? ?Laboratory: ?Recent Labs  ?Lab 02/08/22 ?0228 02/08/22 ?9767 02/09/22 ?3419 02/10/22 ?0136  ?WBC 7.8  --  9.7 12.0*  ?HGB 8.5* 8.3* 9.0* 9.2*  ?HCT 24.9* 25.6* 27.3* 27.4*  ?PLT 312  --  325 308  ? ?Recent Labs  ?Lab 02/04/22 ?0316 02/05/22 ?0145 02/06/22 ?0228 02/07/22 ?3790 02/08/22 ?0228 02/09/22 ?2409 02/10/22 ?0136  ?NA 131*   < > 133*   < > 136 136 136  ?K 4.8   < > 4.6   < > 4.8 4.6 5.1  ?CL 101   < > 104   < > 107 107 107  ?CO2 22   < > 19*   < > 22 21* 22  ?BUN 29*   < > 34*   < > 38* 38* 43*  ?CREATININE 3.58*   < > 3.58*   < > 3.50* 3.57* 3.25*  ?CALCIUM 7.6*   < > 7.7*   < > 7.7* 7.8*  7.7*  ?PROT 6.1*  --  5.3*  --   --   --   --   ?BILITOT 0.3  --  0.4  --   --   --   --   ?ALKPHOS 225*  --  138*  --   --   --   --   ?ALT 28  --  18  --   --   --   --   ?AST 23  --  21  --   --   --   --   ?GLUCOSE 339*   < > 271*   < > 226* 127* 373*  ? < > = values in this interval not displayed.  ? ? ? ? ?Imaging/Diagnostic Tests: ? ? ?[name], MD ?02/10/2022, 6:47 AM ?PGY-3, [place] ?[place] Intern pager: [phone], text pages welcome ? ?

## 2022-02-09 NOTE — Progress Notes (Signed)
Pt refusing CPAP.  RT will continue to monitor.  

## 2022-02-09 NOTE — Progress Notes (Signed)
OT Cancellation Note ? ?Patient Details ?Name: Jeffrey [name] ?MRN: [id number] ?DOB: [birth year] ? ? ?Cancelled Treatment:    Reason Eval/Treat Not Completed: Patient declined, no reason specified Patient groggy, keeps stating social worker trying to get things together to go to rehab. Multiple attempts made to explain OT role in acute setting however patient gets agitated and does not want to participate at this time. Will re-attempt at later date. ? ?Delbert Phenix OT ?OT pager: [phone] ? ? ?Rosemary Holms ?02/09/2022, 10:28 AM ?

## 2022-02-09 NOTE — Progress Notes (Signed)
FPTS Brief Progress Note ? ?S: Patient resting comfortably ? ? ?O: ?BP (!) 144/87 (BP Location: Left Arm)   Pulse 71   Temp 98.2 ?F (36.8 ?C) (Oral)   Resp 14   Ht '5\' 10"'$  (1.778 m)   Wt 121.1 kg   SpO2 97%   BMI 38.30 kg/m?   ? ? ?A/P: ?-Continue plan per day team ?- Orders reviewed. Labs for AM ordered, which was adjusted as needed.  ? ? ?[name], MD ?02/09/2022, 11:46 PM ?PGY-3, [place] Night Resident  ?Please page [phone] with questions.  ? ? ?

## 2022-02-09 NOTE — Plan of Care (Signed)

## 2022-02-09 NOTE — Progress Notes (Signed)
FPTS Brief Progress Note ? ?S:Went to bedside to see patient, patient sleeping comfortably.  ? ? ?O: ?BP (!) 148/95 (BP Location: Left Arm)   Pulse 61   Temp (!) 97.2 ?F (36.2 ?C) (Oral)   Resp 13   Ht '5\' 10"'$  (1.778 m)   Wt 121.9 kg   SpO2 100%   BMI 38.56 kg/m?   ? ? ?A/P: ?- Plans per day team ?- Orders reviewed. Labs for AM ordered, which was adjusted as needed.  ? ? ?Holley Bouche, MD ?02/09/2022, 4:31 AM ?PGY-1, [place] [facility] Night Resident  ?Please page [phone] with questions.  ? ? ?

## 2022-02-09 NOTE — Progress Notes (Signed)
Patient ID: Jeffrey [name], male   DOB: 05-16-1966, 56 y.o.   MRN: [id number] ?[facility] ?Progress Note  ? ?Assessment/ Plan:   ?1. Acute kidney Injury on chronic kidney disease stage IIIa: Nonoliguric with fair response to furosemide overnight; based on his volume status we will start him back on his outpatient oral dose of furosemide 40 mg daily.  Renal function essentially unchanged reflecting that this is likely simply progression of his chronic kidney disease or more likely ATN (which was corroborated by evaluation of urine sediment yesterday showing fine granular casts with multiple normomorphic RBCs) with a prolonged plateau phase.  At this point given the lack of additional worsening of renal function, he is stable from a renal standpoint to discharge to a skilled nursing facility with continued outpatient renal follow-up for ongoing management of chronic kidney disease. ?2.  Alcoholic cirrhosis with ascites: Status post low volume paracentesis on 02/02/2022 and currently without any significant ascites on physical exam or symptoms to prompt need for therapeutic paracentesis. ?3.  Hypertension: Blood pressures under fair control, continue to monitor with intermittent diuresis. ?4.  Acute on chronic anemia: With chronic anemia likely from underlying chronic illnesses including cirrhosis/chronic kidney disease and acute component likely from GI bleed with recent EGD showing esophagitis.  Previously without evidence of portal hypertension. ? ?Subjective:   ?Reports to be feeling fair, denies any chest pain or shortness of breath.  ? ?Objective:   ?BP (!) 143/83 (BP Location: Left Arm)   Pulse 66   Temp 97.6 ?F (36.4 ?C) (Oral)   Resp 10   Ht '5\' 10"'$  (1.778 m)   Wt 121.1 kg   SpO2 100%   BMI 38.30 kg/m?  ? ?Intake/Output Summary (Last 24 hours) at 02/09/2022 0926 ?Last data filed at 02/09/2022 0257 ?Gross per 24 hour  ?Intake 240 ml  ?Output 1750 ml  ?Net -1510 ml  ? ?Weight change: -0.835  kg ? ?Physical Exam: ?Gen: Comfortably sleeping in bed, awakens to calling his name ?CVS: Normal rhythm, normal rate, S1 and S2 normal ?Resp: Poor inspiratory effort with decreased breath sounds over bases, no distinct rales or rhonchi ?Abd: Soft, obese, nontender, bowel sounds normal ?Ext: Trace-1+ pitting edema over lower extremities, some dependent edema noted ? ?Imaging: ?No results found. ? ?Labs: ?BMET ?Recent Labs  ?Lab 02/03/22 ?0421 02/04/22 ?0316 02/05/22 ?0145 02/06/22 ?0228 02/07/22 ?4825 02/08/22 ?0228 02/09/22 ?0240  ?NA 131* 131* 134* 133* 135 136 136  ?K 4.7 4.8 4.6 4.6 4.9 4.8 4.6  ?CL 103 101 105 104 105 107 107  ?CO2 20* 22 21* 19* 19* 22 21*  ?GLUCOSE 217* 339* 264* 271* 178* 226* 127*  ?BUN 26* 29* 30* 34* 34* 38* 38*  ?CREATININE 3.57* 3.58* 3.63* 3.58* 3.45* 3.50* 3.57*  ?CALCIUM 7.2* 7.6* 7.7* 7.7* 8.1* 7.7* 7.8*  ? ?CBC ?Recent Labs  ?Lab 02/06/22 ?0228 02/07/22 ?0037 02/08/22 ?0488 02/08/22 ?8916 02/09/22 ?0240  ?WBC 7.3 7.9 7.8  --  9.7  ?HGB 8.8* 9.8* 8.5* 8.3* 9.0*  ?HCT 27.2* 30.2* 24.9* 25.6* 27.3*  ?MCV 85.5 86.8 85.0  --  86.4  ?PLT 352 383 312  --  325  ? ? ?Medications:   ? ? amLODipine  5 mg Oral Daily  ? feeding supplement  1 Container Oral TID BM  ? hydrocortisone  10 mg Oral q AM  ? And  ? hydrocortisone  5 mg Oral QPM  ? levETIRAcetam  500 mg Oral BID  ? mirtazapine  30  mg Oral QHS  ? multivitamin with minerals  1 tablet Oral Daily  ? pantoprazole  40 mg Oral BID  ? polyethylene glycol  17 g Oral Daily  ? propranolol  40 mg Oral TID  ? rosuvastatin  5 mg Oral Daily  ? tamsulosin  0.4 mg Oral Daily  ? thiamine  100 mg Oral QHS  ? ?Elmarie Shiley, MD ?02/09/2022, 9:26 AM  ? ?

## 2022-02-10 DIAGNOSIS — Z9989 Dependence on other enabling machines and devices: Secondary | ICD-10-CM

## 2022-02-10 DIAGNOSIS — E7800 Pure hypercholesterolemia, unspecified: Secondary | ICD-10-CM

## 2022-02-10 DIAGNOSIS — G4733 Obstructive sleep apnea (adult) (pediatric): Secondary | ICD-10-CM

## 2022-02-10 LAB — CBC
HCT: 27.4 % — ABNORMAL LOW (ref 39.0–52.0)
Hemoglobin: 9.2 g/dL — ABNORMAL LOW (ref 13.0–17.0)
MCH: 28.8 pg (ref 26.0–34.0)
MCHC: 33.6 g/dL (ref 30.0–36.0)
MCV: 85.9 fL (ref 80.0–100.0)
Platelets: 308 10*3/uL (ref 150–400)
RBC: 3.19 MIL/uL — ABNORMAL LOW (ref 4.22–5.81)
RDW: 20 % — ABNORMAL HIGH (ref 11.5–15.5)
WBC: 12 10*3/uL — ABNORMAL HIGH (ref 4.0–10.5)
nRBC: 0 % (ref 0.0–0.2)

## 2022-02-10 LAB — GLUCOSE, CAPILLARY
Glucose-Capillary: 376 mg/dL — ABNORMAL HIGH (ref 70–99)
Glucose-Capillary: 355 mg/dL — ABNORMAL HIGH (ref 70–99)
Glucose-Capillary: 398 mg/dL — ABNORMAL HIGH (ref 70–99)
Glucose-Capillary: 288 mg/dL — ABNORMAL HIGH (ref 70–99)
Glucose-Capillary: 339 mg/dL — ABNORMAL HIGH (ref 70–99)

## 2022-02-10 LAB — BASIC METABOLIC PANEL
Anion gap: 7 (ref 5–15)
BUN: 43 mg/dL — ABNORMAL HIGH (ref 6–20)
CO2: 22 mmol/L (ref 22–32)
Calcium: 7.7 mg/dL — ABNORMAL LOW (ref 8.9–10.3)
Chloride: 107 mmol/L (ref 98–111)
Creatinine, Ser: 3.25 mg/dL — ABNORMAL HIGH (ref 0.61–1.24)
GFR, Estimated: 21 mL/min — ABNORMAL LOW (ref >60)
Glucose, Bld: 373 mg/dL — ABNORMAL HIGH (ref 70–99)
Potassium: 5.1 mmol/L (ref 3.5–5.1)
Sodium: 136 mmol/L (ref 135–145)

## 2022-02-10 MED ORDER — TORSEMIDE 20 MG PO TABS
20.0000 mg | ORAL_TABLET | Freq: Every day | ORAL | Status: DC
  Administered 2022-02-10 – 2022-02-11 (×2): 20 mg via ORAL
  Filled 2022-02-10 (×2): qty 1

## 2022-02-10 MED ORDER — INSULIN ASPART 100 UNIT/ML IJ SOLN
0.0000 [IU] | Freq: Three times a day (TID) | INTRAMUSCULAR | Status: DC
  Administered 2022-02-10: 5 [IU] via SUBCUTANEOUS
  Administered 2022-02-10: 3 [IU] via SUBCUTANEOUS
  Administered 2022-02-10: 5 [IU] via SUBCUTANEOUS
  Administered 2022-02-11: 3 [IU] via SUBCUTANEOUS

## 2022-02-10 MED ORDER — INSULIN ASPART 100 UNIT/ML IJ SOLN
0.0000 [IU] | Freq: Every day | INTRAMUSCULAR | Status: DC
  Administered 2022-02-10: 4 [IU] via SUBCUTANEOUS

## 2022-02-10 NOTE — TOC Progression Note (Signed)
Transition of Care (TOC) - Progression Note  ? ? ?Patient Details  ?Name: Jeffrey [name] ?MRN: [id number] ?Date of Birth: January 02, 1966 ? ?Transition of Care (TOC) CM/SW Contact  ?Bary Castilla, LCSW ?Phone Number: [phone] ?02/10/2022, 3:44 PM ? ?Clinical Narrative:    ? ?CSW spoke with Shirlee Limerick from [facility] and she confirmed that pt's authorization is back with a start date of Monday. Pt can be DC on Monday. ? ?TOC team will continue to assist with discharge planning needs. nondm ?  ?Barriers to Discharge: Insurance Authorization ? ?Expected Discharge Plan and Services ?  ?In-house Referral: Clinical Social Work ?  ?  ?Living arrangements for the past 2 months: [place] ?                ?  ?  ?  ?  ?  ?  ?  ?  ?  ?  ? ? ?Social Determinants of Health (SDOH) Interventions ?  ? ?Readmission Risk Interventions ? ?  12/21/2021  ?  3:10 PM 09/17/2021  ? 12:59 PM 08/01/2021  ?  1:24 PM  ?Readmission Risk Prevention Plan  ?Transportation Screening Complete Complete Complete  ?PCP or Specialist Appt within 5-7 Days   Complete  ?PCP or Specialist Appt within 3-5 Days Complete Complete   ?Home Care Screening   Patient refused  ?Medication Review (RN CM)   Complete  ?[place] or Home Care Consult Complete Complete   ?Social Work Consult for [place] Planning/Counseling Complete Complete   ?Palliative Care Screening Not Applicable Not Applicable   ?Medication Review Press photographer) Complete Complete   ? ? ?

## 2022-02-10 NOTE — Progress Notes (Signed)
Pt requested for blood sugar test, he thought it might be low. Finger stick blood sugar was 376, lab this am showed 373. On call MD DR. Cresenzo notified. Plan on giving sliding scale with breakfast. Will continue to monitor.  ?

## 2022-02-10 NOTE — Progress Notes (Signed)
Patient ID: Jeffrey [name], male   DOB: [birth year], 56 y.o.   MRN: [id number] ?[facility] ?Progress Note  ? ?Assessment/ Plan:   ?1. Acute kidney Injury on chronic kidney disease stage IIIa: Nonoliguric on his outpatient dose of furosemide 40 mg daily-given his hypoalbuminemia, there might be some pharmacokinetic advantage of switching him to torsemide for improved efficacy and I will convert him to torsemide 20 mg daily today.  Renal function slightly improved overnight-encouraging as this might be an indication of renal recovery after prolonged plateau phase of ATN (based on urine sediment analysis).  Clinically stable from renal standpoint to discharge to a skilled nursing facility (when available) with continued outpatient renal follow-up for ongoing management of chronic kidney disease. ?2.  Alcoholic cirrhosis with ascites: Status post low volume paracentesis on 02/02/2022 and currently without any significant ascites on physical exam or symptoms to prompt need for therapeutic paracentesis. ?3.  Hypertension: Blood pressures under fair control, continue to monitor with intermittent diuresis. ?4.  Acute on chronic anemia: With chronic anemia likely from underlying chronic illnesses including cirrhosis/chronic kidney disease and acute component likely from GI bleed with recent EGD showing esophagitis.  Previously without evidence of portal hypertension. ? ?Subjective:   ?Has any acute events overnight and reports that he does not have any chest pain.  Inquires about skilled nursing facility placement.  ? ?Objective:   ?BP 131/78 (BP Location: Left Arm)   Pulse 79   Temp 98.3 ?F (36.8 ?C) (Oral)   Resp 18   Ht '5\' 10"'$  (1.778 m)   Wt 121 kg   SpO2 98%   BMI 38.28 kg/m?  ? ?Intake/Output Summary (Last 24 hours) at 02/10/2022 0948 ?Last data filed at 02/10/2022 0341 ?Gross per 24 hour  ?Intake 480 ml  ?Output 1300 ml  ?Net -820 ml  ? ?Weight change: -0.065 kg ? ?Physical Exam: ?Gen: Comfortably  resting in bed, nurse at bedside ?CVS: Normal rhythm, normal rate, S1 and S2 normal ?Resp: Poor inspiratory effort with decreased breath sounds over bases, no distinct rales or rhonchi ?Abd: Soft, obese, nontender, bowel sounds normal ?Ext: Trace-1+ pitting edema over lower extremities, some dependent edema noted ? ?Imaging: ?No results found. ? ?Labs: ?BMET ?Recent Labs  ?Lab 02/04/22 ?0316 02/05/22 ?0145 02/06/22 ?0228 02/07/22 ?8676 02/08/22 ?0228 02/09/22 ?7209 02/10/22 ?0136  ?NA 131* 134* 133* 135 136 136 136  ?K 4.8 4.6 4.6 4.9 4.8 4.6 5.1  ?CL 101 105 104 105 107 107 107  ?CO2 22 21* 19* 19* 22 21* 22  ?GLUCOSE 339* 264* 271* 178* 226* 127* 373*  ?BUN 29* 30* 34* 34* 38* 38* 43*  ?CREATININE 3.58* 3.63* 3.58* 3.45* 3.50* 3.57* 3.25*  ?CALCIUM 7.6* 7.7* 7.7* 8.1* 7.7* 7.8* 7.7*  ? ?CBC ?Recent Labs  ?Lab 02/07/22 ?4709 02/08/22 ?6283 02/08/22 ?6629 02/09/22 ?4765 02/10/22 ?0136  ?WBC 7.9 7.8  --  9.7 12.0*  ?HGB 9.8* 8.5* 8.3* 9.0* 9.2*  ?HCT 30.2* 24.9* 25.6* 27.3* 27.4*  ?MCV 86.8 85.0  --  86.4 85.9  ?PLT 383 312  --  325 308  ? ? ?Medications:   ? ? amLODipine  5 mg Oral Daily  ? feeding supplement  1 Container Oral TID BM  ? furosemide  40 mg Oral Daily  ? hydrocortisone  10 mg Oral q AM  ? And  ? hydrocortisone  5 mg Oral QPM  ? insulin aspart  0-5 Units Subcutaneous QHS  ? insulin aspart  0-6 Units Subcutaneous TID  WC  ? levETIRAcetam  500 mg Oral BID  ? mirtazapine  30 mg Oral QHS  ? multivitamin with minerals  1 tablet Oral Daily  ? pantoprazole  40 mg Oral BID  ? polyethylene glycol  17 g Oral Daily  ? propranolol  40 mg Oral TID  ? rosuvastatin  5 mg Oral Daily  ? tamsulosin  0.4 mg Oral Daily  ? thiamine  100 mg Oral QHS  ? ?Elmarie Shiley, MD ?02/10/2022, 9:48 AM  ? ?

## 2022-02-10 NOTE — Progress Notes (Signed)
Mobility Specialist Progress Note: ? ? 02/10/22 1725  ?Orthostatic Lying   ?BP- Lying 111/74  ?Orthostatic Sitting  ?BP- Sitting 110/71  ?Orthostatic Standing at 0 minutes  ?BP- Standing at 0 minutes 94/56  ?Orthostatic Standing at 3 minutes  ?BP- Standing at 3 minutes 95/51  ?Mobility  ?Activity Transferred from bed to chair  ?Level of Assistance Minimal assist, patient does 75% or more  ?Assistive Device Front wheel walker  ?Distance Ambulated (ft) 15 ft  ?Activity Response Tolerated well  ?$Mobility charge 1 Mobility  ? ?Pt agreeable to mobility session. Required minA to stand from elevated surface. Ambulated around bed with RW and minG. X1 episode of dizziness during orthostatic BPs. Pt left in chair with chair alarm on.  ? ?Nelta Numbers ?Acute Rehab ?Phone: [phone] ?Office Phone: [phone] ? ?

## 2022-02-11 ENCOUNTER — Encounter: Payer: Self-pay | Admitting: Gastroenterology

## 2022-02-11 LAB — RENAL FUNCTION PANEL
Albumin: 1.5 g/dL — ABNORMAL LOW (ref 3.5–5.0)
Anion gap: 10 (ref 5–15)
BUN: 48 mg/dL — ABNORMAL HIGH (ref 6–20)
CO2: 21 mmol/L — ABNORMAL LOW (ref 22–32)
Calcium: 7.7 mg/dL — ABNORMAL LOW (ref 8.9–10.3)
Chloride: 105 mmol/L (ref 98–111)
Creatinine, Ser: 3.46 mg/dL — ABNORMAL HIGH (ref 0.61–1.24)
GFR, Estimated: 20 mL/min — ABNORMAL LOW (ref >60)
Glucose, Bld: 429 mg/dL — ABNORMAL HIGH (ref 70–99)
Phosphorus: 4.4 mg/dL (ref 2.5–4.6)
Potassium: 5.1 mmol/L (ref 3.5–5.1)
Sodium: 136 mmol/L (ref 135–145)

## 2022-02-11 LAB — MAGNESIUM: Magnesium: 1.4 mg/dL — ABNORMAL LOW (ref 1.7–2.4)

## 2022-02-11 LAB — GLUCOSE, CAPILLARY
Glucose-Capillary: 387 mg/dL — ABNORMAL HIGH (ref 70–99)
Glucose-Capillary: 287 mg/dL — ABNORMAL HIGH (ref 70–99)
Glucose-Capillary: 356 mg/dL — ABNORMAL HIGH (ref 70–99)
Glucose-Capillary: 331 mg/dL — ABNORMAL HIGH (ref 70–99)
Glucose-Capillary: 246 mg/dL — ABNORMAL HIGH (ref 70–99)

## 2022-02-11 MED ORDER — HYDROCORTISONE 10 MG PO TABS: 10.0000 mg | ORAL_TABLET | Freq: Every morning | ORAL | 0 refills | Status: DC

## 2022-02-11 MED ORDER — PROPRANOLOL HCL 40 MG PO TABS: 40.0000 mg | ORAL_TABLET | Freq: Three times a day (TID) | ORAL | 0 refills | Status: DC

## 2022-02-11 MED ORDER — INSULIN ASPART 100 UNIT/ML IJ SOLN
3.0000 [IU] | Freq: Once | INTRAMUSCULAR | Status: AC
  Administered 2022-02-11: 3 [IU] via SUBCUTANEOUS

## 2022-02-11 MED ORDER — ROSUVASTATIN CALCIUM 5 MG PO TABS: 5.0000 mg | ORAL_TABLET | Freq: Every day | ORAL | 0 refills | Status: DC

## 2022-02-11 MED ORDER — AMLODIPINE BESYLATE 5 MG PO TABS: 5.0000 mg | ORAL_TABLET | Freq: Every day | ORAL | 0 refills | Status: DC

## 2022-02-11 MED ORDER — LEVETIRACETAM 500 MG PO TABS: 500.0000 mg | ORAL_TABLET | Freq: Two times a day (BID) | ORAL | 0 refills | Status: DC

## 2022-02-11 MED ORDER — TAMSULOSIN HCL 0.4 MG PO CAPS: 0.4000 mg | ORAL_CAPSULE | Freq: Every day | ORAL | 0 refills | Status: DC

## 2022-02-11 MED ORDER — HYDROCORTISONE 5 MG PO TABS: 5.0000 mg | ORAL_TABLET | Freq: Every evening | ORAL | Status: DC

## 2022-02-11 MED ORDER — MAGNESIUM SULFATE 2 GM/50ML IV SOLN
2.0000 g | Freq: Once | INTRAVENOUS | Status: AC
  Administered 2022-02-11: 2 g via INTRAVENOUS
  Filled 2022-02-11: qty 50

## 2022-02-11 MED ORDER — INSULIN ASPART 100 UNIT/ML IJ SOLN: 3.0000 [IU] | Freq: Three times a day (TID) | INTRAMUSCULAR | 11 refills | Status: DC

## 2022-02-11 MED ORDER — INSULIN ASPART 100 UNIT/ML IJ SOLN
5.0000 [IU] | Freq: Once | INTRAMUSCULAR | Status: AC
  Administered 2022-02-11: 5 [IU] via SUBCUTANEOUS

## 2022-02-11 MED ORDER — INSULIN ASPART 100 UNIT/ML IJ SOLN
3.0000 [IU] | Freq: Three times a day (TID) | INTRAMUSCULAR | Status: DC
  Administered 2022-02-11: 3 [IU] via SUBCUTANEOUS

## 2022-02-11 MED ORDER — TORSEMIDE 20 MG PO TABS: 20.0000 mg | ORAL_TABLET | Freq: Every day | ORAL | 0 refills | Status: DC

## 2022-02-11 NOTE — Plan of Care (Signed)

## 2022-02-11 NOTE — Progress Notes (Addendum)
? [facility] ?Progress Note  ? ? ?Assessment/ Plan:   ?Jeffrey [name] is a 56 y.o. male with history of alcoholic cirrhosis, X3AT, CKD IIIa, adrenal insufficiency, PEA arrest with anoxic brain injury, presenting with melena concerning for GI bleed.  Hospital course complicated by AKI superimposed on CKD. ? ?1.  AKI superimposed on CKD IIIa - Likely ATN with prolonged plateau phase based on urine sediment analysis. SPEP consistent with hypoalbuminemia, no evidence of monoclonal gammopathy. No significant improvement in serum creatinine overnight. Non-dysmorphic RBCs seen on sediment analysis making GN less likely but will check labs to assess for other causes as he has not made significant improvement. ?- continue torsemide 20 mg daily ?- check ANA, complement levels to rule out SLE ?- check ANCA profile  ?- needs urology outpatient follow-up for chronic hematuria ?2.  Alcoholic cirrhosis with ascites - s/p low volume paracentesis 4/29, no clinical need for therapeutic paracentesis at this time, per primary ?3.  HTN - good control on propranolol and amlodipine, per primary ?4.  Acute on chronic anemia - Stable, likely secondary to cirrhosis/CKD, EGD this admission showed esophagitis without active bleeding or portal hypertension ?4.  T2DM - per primary ? ? ? ?Subjective:   ?Patient reports he voided well yesterday. He reports good urine output yesterday, at least 1 jug of urine. ? ?100 ml urine output charted in the last 24 hours though unclear if accurate (PM output not charted).  ? ?Objective:   ?BP (!) 145/103 (BP Location: Left Arm)   Pulse 65   Temp 97.6 ?F (36.4 ?C) (Oral)   Resp 17   Ht '5\' 10"'$  (1.778 m)   Wt 123.2 kg   SpO2 99%   BMI 38.97 kg/m?  ? ?Intake/Output Summary (Last 24 hours) at 02/11/2022 0902 ?Last data filed at 02/11/2022 0000 ?Gross per 24 hour  ?Intake 240 ml  ?Output --  ?Net 240 ml  ? ? ?Weight change: 2.2 kg ? ?Physical Exam: ?Gen: Alert, sitting up eating breakfast,  NAD ?CVS: RRR, no murmurs ?Resp: Clear to auscultation bilaterally ?Abd: Soft, nontender ?Ext: 2+ pitting edema bilateral lower extremities up to the proximal thighs, trace pitting edema in bilateral forearms ? ?Imaging: ?No results found. ? ?Labs: ?BMET ?Recent Labs  ?Lab 02/05/22 ?0145 02/06/22 ?0228 02/07/22 ?5573 02/08/22 ?0228 02/09/22 ?2202 02/10/22 ?0136 02/11/22 ?5427  ?NA 134* 133* 135 136 136 136 136  ?K 4.6 4.6 4.9 4.8 4.6 5.1 5.1  ?CL 105 104 105 107 107 107 105  ?CO2 21* 19* 19* 22 21* 22 21*  ?GLUCOSE 264* 271* 178* 226* 127* 373* 429*  ?BUN 30* 34* 34* 38* 38* 43* 48*  ?CREATININE 3.63* 3.58* 3.45* 3.50* 3.57* 3.25* 3.46*  ?CALCIUM 7.7* 7.7* 8.1* 7.7* 7.8* 7.7* 7.7*  ?PHOS  --   --   --   --   --   --  4.4  ? ? ?CBC ?Recent Labs  ?Lab 02/07/22 ?0623 02/08/22 ?7628 02/08/22 ?3151 02/09/22 ?7616 02/10/22 ?0136  ?WBC 7.9 7.8  --  9.7 12.0*  ?HGB 9.8* 8.5* 8.3* 9.0* 9.2*  ?HCT 30.2* 24.9* 25.6* 27.3* 27.4*  ?MCV 86.8 85.0  --  86.4 85.9  ?PLT 383 312  --  325 308  ? ? ? ?Medications:   ? ? amLODipine  5 mg Oral Daily  ? feeding supplement  1 Container Oral TID BM  ? hydrocortisone  10 mg Oral q AM  ? And  ? hydrocortisone  5 mg Oral QPM  ?  insulin aspart  0-5 Units Subcutaneous QHS  ? insulin aspart  0-6 Units Subcutaneous TID WC  ? levETIRAcetam  500 mg Oral BID  ? mirtazapine  30 mg Oral QHS  ? multivitamin with minerals  1 tablet Oral Daily  ? pantoprazole  40 mg Oral BID  ? polyethylene glycol  17 g Oral Daily  ? propranolol  40 mg Oral TID  ? rosuvastatin  5 mg Oral Daily  ? tamsulosin  0.4 mg Oral Daily  ? thiamine  100 mg Oral QHS  ? torsemide  20 mg Oral Daily  ? ? ? ? ?Zola Button, MD ?[place], PGY-2  ? ?Seen and examined independently.  Agree with note and exam as documented above by resident physician and as noted here. ? ?States that he is making good urine - not being recorded ? ?General adult male in bed in no acute distress ?HEENT normocephalic atraumatic extraocular  movements intact sclera anicteric ?Neck supple trachea midline ?Lungs clear to auscultation bilaterally normal work of breathing at rest  ?Heart S1S2 no rub ?Abdomen soft nontender nondistended ?Extremities 1-2+ edema  ?Psych normal mood and affect ? ?AKI ?- previously felt 2/2 ATN and his urine analysis by nephrology consulting team consistent with same.  Note that he does have microscopic hematuria and proteinuria - nephrotic range. ?- continue supportive care ?- continue current torsemide dose ?- strict in/outs   ? ?Microscopic hematuria  ?- check ANA, ANCA, complement ? ?Nephrotic range proteinuria  ?- spep and free light chains ok ?- may be 2/2 DM  ? ?EtOH cirrhosis ?- cessation is key  ? ?Claudia Desanctis, MD ?02/11/2022 ? 12:48 PM ? ?

## 2022-02-11 NOTE — Discharge Instructions (Addendum)
Dear Jeffrey [name],  ? ?Thank you for letting us participate in your care! In this section, you will find a brief hospital admission summary of why you were admitted to the hospital, what happened during your admission, your diagnosis/diagnoses, and recommended follow up.  ?You were admitted because you were experiencing bleeding in your stool.  ?Your testing revealed you have erosive esophagitis and changes in kidney function and you will follow up outpatient with GI and nephrology. ?You were treated with 1 unit of blood, diuretics to remove fluid, a paracentesis to remove fluid from your abdomen. ?You were also seen by the nephrologists (kidney doctors). They recommended IV albumin, diuretics and will follow you outpatient.  ?Your conditions improved and you were discharged from the hospital for meeting this goal.  ? ? ?POST-HOSPITAL & CARE INSTRUCTIONS ?Please let PCP/Specialists know of any changes in medications that were made.  ?Please see medications section of this packet for any medication changes.  ? ?DOCTOR'S APPOINTMENTS & FOLLOW UP ?Future Appointments  ?Date Time Provider [place]  ?Mar 07, 2022  8:50 AM Carlean Purl Ofilia Neas, MD [facility]  ? ? ?Thank you for choosing [facility]! Take care and be well! ? ?Family Medicine Teaching Service Inpatient Team ?[place]  ?[facility]  ?[address] [place], [place] [address] ?[phone] ?

## 2022-02-11 NOTE — TOC Transition Note (Addendum)
Transition of Care (TOC) - CM/SW Discharge Note ? ? ?Patient Details  ?Name: Jeffrey [name] ?MRN: [id number] ?Date of Birth: Jul 20, 1966 ? ?Transition of Care (TOC) CM/SW Contact:  ?Vinie Sill, LCSW ?Phone Number: ?02/11/2022, 5:07 PM ? ? ?Clinical Narrative:    ? ?Patient will Discharge to: [facility] ?Discharge Date: 02/11/2022 ?Family Notified:patient will notify family ?Transport By: 05/0/2023 ? ?Please give PM meds if patient leaves after 8pm. ?Per MD patient is ready for discharge. RN, patient, and facility notified of discharge. Discharge Summary sent to facility. RN given number for [phone], Room 130. Ambulance transport requested for patient.  ? ?Clinical Social Worker signing off. ? ?Thurmond Butts, MSW, LCSW ?Clinical Social Worker ? ? ? ? ?Final next level of care: [place] ?Barriers to Discharge: Barriers Resolved ? ? ?Patient Goals and CMS Choice ?  ?  ?  ? ?Discharge Placement ?  ?           ?Patient chooses bed at:  ([facility]) ?Patient to be transferred to facility by: [facility] ?Name of family member notified: patient will inform family ?Patient and family notified of of transfer: 02/11/22 ? ?Discharge Plan and Services ?In-house Referral: Clinical Social Work ?  ?           ?  ?  ?  ?  ?  ?  ?  ?  ?  ?  ? ?Social Determinants of Health (SDOH) Interventions ?  ? ? ?Readmission Risk Interventions ? ?  12/21/2021  ?  3:10 PM 09/17/2021  ? 12:59 PM 08/01/2021  ?  1:24 PM  ?Readmission Risk Prevention Plan  ?Transportation Screening Complete Complete Complete  ?PCP or Specialist Appt within 5-7 Days   Complete  ?PCP or Specialist Appt within 3-5 Days Complete Complete   ?Home Care Screening   Patient refused  ?Medication Review (RN CM)   Complete  ?[place] or Home Care Consult Complete Complete   ?Social Work Consult for [place] Planning/Counseling Complete Complete   ?Palliative Care Screening Not Applicable Not Applicable   ?Medication Review Press photographer)  Complete Complete   ? ? ? ? ? ?

## 2022-02-11 NOTE — Progress Notes (Signed)
PT Cancellation Note ? ?Patient Details ?Name: Jeffrey [name] ?MRN: [id number] ?DOB: [birth year] ? ? ?Cancelled Treatment:    Reason Eval/Treat Not Completed: (P) Patient declined, no reason specified (pt requesting more time to sleep) Will continue efforts in afternoon per PT plan of care as schedule permits. ? ? ?Kara Pacer Frenchie Pribyl ?02/11/2022, 11:40 AM ? ? ?

## 2022-02-11 NOTE — Progress Notes (Signed)
Called report to RN at [employer].  ? ?Lavenia Atlas, RN ? ? ?

## 2022-02-11 NOTE — Progress Notes (Signed)
Family Medicine Teaching Service ?Daily Progress Note ?Intern Pager: [phone] ? ?Patient name: Jeffrey [name] Medical record number: [id number] ?Date of birth: 1965-12-30 Age: 56 y.o. Gender: male ? ?Primary Care Provider: [name], MD ?Consultants: GI, Nephrology ?Code Status: Full ? ?Pt Overview and Major Events to Date:  ?4/29: Admitted ?4/30: EGD with LA grade D esophagitis ?5/2: 1u pRBC transfused ? ?Assessment and Plan: ?[name] is a 56 year old male admitted for melena, abdominal distention, and AKI; concern for GI bleed in the setting of alcoholic cirrhosis. PMH is significant for alcoholic cirrhosis, history of heavy etOH use, hx of erosive esophagitis, T2DM, C-difficile infection, cardiac arrest with anoxic brain injury, seizure disorder, depression, HLD. ? ?AKI on CKD3a, likely ATN ?Changed to Torsemide '20mg'$  daily, per nephrology. Cr bumped to 3.46 from 3.25 yesterday, but overall stable. UOP not well documented in past 24h. ?- Torsemide '20mg'$  daily ?- Strict I/O ?- Continue Tamsulosin ?- Stable for discharge from nephrology standpoint, will have o/p f/u with nephro ? ?Anemia of chronic disease, erosive esophagitis  ETOH cirrhosis ?Hgb 9.2 yesterday, stable near baseline 8-9. ?- Continue PO protonix '40mg'$  BID indefinitely ?- To have o/p f/u with GI for repeat EGD in a few months ? ?HTN ?BP stable, better controlled in 110s-140s/70s-80s. ?- Continue Propranolol '40mg'$  TID ?- Continue Amlodipine '5mg'$  daily ? ?Brittle T2DM ?CBG range 288-387 in past 24 hours. Difficult to to control wide swings in glucose. Receivied 12u short-acting insulin in past 24 hours. ?- Start prandial insulin coverage, 3u TID with meals ?- Continue CBG monitoring ? ?FEN/GI: Regular diet ?PPx: SCDs, no pharmacologic VTE/DVT ppx given high bleeding risk ?Dispo:SNF  ? ?Subjective:  ?Reports some diarrhea this morning. Has had loose stools for a week. No abdominal pain. No blood in stool. No other complaints. ? ?Objective: ?Temp:   [97.6 ?F (36.4 ?C)-98.3 ?F (36.8 ?C)] 97.6 ?F (36.4 ?C) (05/08 1660) ?Pulse Rate:  [65-79] 65 (05/08 0808) ?Resp:  [17-20] 17 (05/08 6301) ?BP: (111-145)/(70-103) 145/103 (05/08 6010) ?SpO2:  [94 %-99 %] 99 % (05/08 0808) ?Weight:  [123.2 kg] 123.2 kg (05/08 0516) ?Physical Exam: ?General: Well-appearing, sitting upright in chair ?HEENT: Facial puffiness with Cushingoid appearance ?Cardiovascular: RRR, no murmurs ?Respiratory: Normal WOB on room air. No crackles/wheezing ?Abdomen: Soft, NTND, + BS ?Extremities: Chronic woody vascular changes in BLLE. 2-3+ pitting edema in BLLE ? ?Laboratory: ?Recent Labs  ?Lab 02/08/22 ?0228 02/08/22 ?9323 02/09/22 ?5573 02/10/22 ?0136  ?WBC 7.8  --  9.7 12.0*  ?HGB 8.5* 8.3* 9.0* 9.2*  ?HCT 24.9* 25.6* 27.3* 27.4*  ?PLT 312  --  325 308  ? ?Recent Labs  ?Lab 02/06/22 ?0228 02/07/22 ?2202 02/09/22 ?5427 02/10/22 ?0136 02/11/22 ?0623  ?NA 133*   < > 136 136 136  ?K 4.6   < > 4.6 5.1 5.1  ?CL 104   < > 107 107 105  ?CO2 19*   < > 21* 22 21*  ?BUN 34*   < > 38* 43* 48*  ?CREATININE 3.58*   < > 3.57* 3.25* 3.46*  ?CALCIUM 7.7*   < > 7.8* 7.7* 7.7*  ?PROT 5.3*  --   --   --   --   ?BILITOT 0.4  --   --   --   --   ?ALKPHOS 138*  --   --   --   --   ?ALT 18  --   --   --   --   ?AST 21  --   --   --   --   ?  GLUCOSE 271*   < > 127* 373* 429*  ? < > = values in this interval not displayed.  ? ? ?Imaging/Diagnostic Tests: ?No results found. ? ? ?Orvis Brill, DO ?02/11/2022, 9:27 AM ?PGY-1, [place] ?[place] Intern pager: [phone], text pages welcome ? ?

## 2022-02-11 NOTE — Progress Notes (Signed)
FPTS Brief Progress Note ? ?S:Patient sleeping when I went in ? ? ?O: ?BP 140/78 (BP Location: Left Arm)   Pulse 73   Temp 98.3 ?F (36.8 ?C) (Oral)   Resp 20   Ht '5\' 10"'$  (1.778 m)   Wt 121 kg   SpO2 95%   BMI 38.28 kg/m?   ? ?Gen: asleep, nad ?Resp: no acute distress ? ?A/P: ?Glucose elevated to 429 on RFP. Will check fingerstick and provide coverage. Night RN said pt had eaten sandwich and soda before that result ?- Orders reviewed. Labs for AM ordered, which was adjusted as needed.  ?- consider meal time coverage ? ?Gerrit Heck, MD ?02/11/2022, 4:54 AM ?PGY-1, [place] [facility] Night Resident  ?Please page [phone] with questions.  ?  ?

## 2022-02-11 NOTE — Progress Notes (Deleted)
Hold off on 3 units of insulin schedule at 6 pm per Dr. Posey Pronto.  ? ?Lavenia Atlas, RN ? ?

## 2022-02-11 NOTE — Discharge Summary (Signed)
Family Medicine Teaching [facility] Discharge Summary  Patient name: Jeffrey [name] Medical record number: [id number] Date of birth: 11/30/1965 Age: 56 y.o. Gender: male Date of Admission: 02/02/2022  Date of Discharge: 02/11/2022 Admitting Physician: Jeffrey Amel, MD  Primary Care Provider: Derrel Nip, MD Consultants: Nephrology and Gastroenterology  Indication for Hospitalization: Dark-colored stools  Discharge Diagnoses/Problem List:  Principal Problem:   Gastroesophageal reflux disease with esophagitis and hemorrhage Active Problems:   Type 2 diabetes mellitus with diabetic neuropathy, with long-term current use of insulin (HCC)   HYPERCHOLESTEROLEMIA   Anoxic brain injury (HCC)   OSA on CPAP   Anemia of chronic disease   ETOH abuse   Acute renal failure (HCC)   Cirrhosis (HCC)   Stage 3a chronic kidney disease (HCC)   GI bleed   Symptomatic anemia   Disposition: SNF  Discharge Condition: Stable  Discharge Exam:  Blood pressure 137/82, pulse 80, temperature 97.7 F (36.5 C), temperature source Oral, resp. rate 17, height 5\' 10"  (1.778 m), weight 123.2 kg, SpO2 98 %.  General: Well-appearing, sitting upright in chair HEENT: Facial puffiness with Cushingoid appearance Cardiovascular: RRR, no murmurs Respiratory: Normal WOB on room air. No crackles/wheezing Abdomen: Soft, NTND, + BS Extremities: Chronic woody vascular changes in BLLE. 2-3+ pitting edema in [facility]  Brief Hospital Course:  Brief Hospital Course Jeffrey [name] is a 56yo who presented to the [facility] ED on 4/29 with generalized weakness and black schools, admitted for concern for a GI bleed.  PMH of alcoholic cirrhosis, history of heavy alcohol use, history of erosive esophagitis, type 2 diabetes, C. difficile infection, cardiac arrest with anoxic brain injury, seizure disorder, depression.  GI Bleed Concern with erosive esophagitis Patient presented with 5 days of black stool.  Hemoglobin was noted to  have dropped from 9.5-8.1 over the 10 days prior to admission.  FOBT obtained in the ED was positive.  Patient also had new 8/10 abdominal pain and abdominal distention.  GI was consulted and made plans for an EGD on 4/30.  EGD demonstrated LA grade D esophagitis, small hiatal hernia, mild portal hypertensive gastropathy, single localized erosion of duodenal bulb, and multiple nonbleeding superficial duodenal ulcers (Forrest class IIc). Patient remained hemodynamically stable. Concern for hemoglobin drop, was transfused one unit pRBC. Hemoglobin remained stable.  Patient was maintained on twice daily PPI. He was scheduled for outpatient GI follow-up at discharge.   Anasarca Alcoholic Cirrhosis Patient was markedly volume up on admission, with BLE edema, abdominal distention, and periorbital edema.  Right upper quadrant ultrasound revealed mild to moderate ascites.  Paracentesis yielded 1.6L. with  labs revealing <3.0 protein, <1.5 albumin.  Patient was started on ceftriaxone for SBP prophylaxis in the setting of GI bleed as above, which was continued for 7 days per GI recommendation.  Patient's chronic hypoalbuminemia was thought to be contributing to his anasarca.  Albumin on arrival was <1.5.  RD was consulted and recommended supplementation with Boost Breeze.  Secondary Adrenal Insufficiency Diagnosis made based on lab work from previous admission that was not followed up as an outpatient.  Patient was started on hydrocortisone 15 milligrams in the morning and 10 mg at night at the time of admission.   AKI on CKD with ATN Cr on admission was 3.59, up from 1.56 prior to hospitalization. FeNa < 0.1%, indicating pre-renal cause of AKI.  Renal ultrasound was obtained and showed no hydronephrosis. Nephrology was consulted and recommended starting IV albumin and flomax, which improved his urinary hesitancy. Urine  protein creatinine ratio was obtained to rule out nephrosis and showed elevation to 4.05,  elevated to 10.89 a few days later with concern for nephrotic syndrome. He had urine microscopy which show granular casts, and his elevated Cr and other findings were believed he had acute tubular necrosis. He was discharged on Torsemide 20mg  daily. Cr at discharge was 3.46 with GFR 20.  Type 2 Diabetes Mellitus, brittle CBG difficult to control while inpatient with swings to 300-400s and hypoglycemia to 50s-60s. He was discharged on scheduled mealtime insulin: 3u short-acting insulin TID with meals.  Hypertension  BP elevated in 180s/100s persistently. Started on Propranolol 20mg  daily in light of cirrhosis.  For discharge follow-up with PCP: 1. Will need endocrinology referral for secondary adrenal insufficiency 2. Will follow-up with GI for repeat EGD ina few months to ensure mucosal healing. Will also follow-up with nephrology outpatient. 3. Started on propranolol and amlodipine for hypertension. Follow-up on BP and BMP.    Significant Labs and Imaging:  Recent Labs  Lab 02/08/22 0228 02/08/22 0605 02/09/22 0240 02/10/22 0136  WBC 7.8  --  9.7 12.0*  HGB 8.5* 8.3* 9.0* 9.2*  HCT 24.9* 25.6* 27.3* 27.4*  PLT 312  --  325 308   Recent Labs  Lab 02/06/22 0228 02/07/22 0117 02/08/22 0228 02/09/22 0240 02/10/22 0136 02/11/22 0223  NA 133* 135 136 136 136 136  K 4.6 4.9 4.8 4.6 5.1 5.1  CL 104 105 107 107 107 105  CO2 19* 19* 22 21* 22 21*  GLUCOSE 271* 178* 226* 127* 373* 429*  BUN 34* 34* 38* 38* 43* 48*  CREATININE 3.58* 3.45* 3.50* 3.57* 3.25* 3.46*  CALCIUM 7.7* 8.1* 7.7* 7.8* 7.7* 7.7*  MG  --   --   --   --   --  1.4*  PHOS  --   --   --   --   --  4.4  ALKPHOS 138*  --   --   --   --   --   AST 21  --   --   --   --   --   ALT 18  --   --   --   --   --   ALBUMIN 1.9*  --   --   --   --  1.5*    Results/Tests Pending at Time of Discharge: None  Discharge Medications:  Allergies as of 02/11/2022       Reactions   Atorvastatin Other (See Comments)   Cause  Muscle Weakness   Lisinopril Other (See Comments)   Pt reports nose bleeds   Losartan Other (See Comments)   Patient stopped taking this because he felt like passing out after standing up   Drug Ingredient [spironolactone] Other (See Comments)   Hyperkalemia        Medication List     STOP taking these medications    ascorbic acid 500 MG tablet Commonly known as: VITAMIN C   DULoxetine 30 MG capsule Commonly known as: Cymbalta   finasteride 5 MG tablet Commonly known as: PROSCAR   furosemide 20 MG tablet Commonly known as: LASIX   gabapentin 300 MG capsule Commonly known as: NEURONTIN   hydrocerin Crea   metFORMIN 500 MG 24 hr tablet Commonly known as: GLUCOPHAGE-XR       TAKE these medications    amLODipine 5 MG tablet Commonly known as: NORVASC Take 1 tablet (5 mg total) by mouth daily. Start taking on: Feb 12, 2022   blood glucose meter kit and supplies Dispense based on patient and insurance preference. Use up to four times daily as directed. (FOR ICD-10 E10.9, E11.9). dispense 90 test strips and 90 lancets.   calcium carbonate 1250 (500 Ca) MG tablet Commonly known as: OS-CAL - dosed in mg of elemental calcium Take 1 tablet (500 mg of elemental calcium total) by mouth daily with breakfast.   Ensure Max Protein Liqd Take 330 mLs (11 oz total) by mouth 3 (three) times daily between meals.   FeroSul 325 (65 FE) MG tablet Generic drug: ferrous sulfate Take 1 tablet (325 mg total) by mouth every other day. What changed: when to take this   folic acid 1 MG tablet Commonly known as: FOLVITE Take 1 tablet by mouth once daily   FreeStyle Libre 2 Sensor Misc Use as directed   hydrocortisone 5 MG tablet Commonly known as: CORTEF Take 1 tablet (5 mg total) by mouth every evening.   hydrocortisone 10 MG tablet Commonly known as: CORTEF Take 1 tablet (10 mg total) by mouth in the morning. Start taking on: Feb 12, 2022   insulin aspart 100 UNIT/ML  injection Commonly known as: novoLOG Inject 3 Units into the skin with breakfast, with lunch, and with evening meal.   levETIRAcetam 500 MG tablet Commonly known as: KEPPRA Take 1 tablet (500 mg total) by mouth 2 (two) times daily. What changed:  medication strength how much to take   mirtazapine 30 MG tablet Commonly known as: REMERON Take 1 tablet (30 mg total) by mouth at bedtime.   multivitamin with minerals Tabs tablet Take 1 tablet by mouth daily. What changed: when to take this   pantoprazole 40 MG tablet Commonly known as: Protonix Take 1 tablet (40 mg total) by mouth 2 (two) times daily. What changed: when to take this   propranolol 40 MG tablet Commonly known as: INDERAL Take 1 tablet (40 mg total) by mouth 3 (three) times daily.   rosuvastatin 5 MG tablet Commonly known as: CRESTOR Take 1 tablet (5 mg total) by mouth daily. Start taking on: Feb 12, 2022   tamsulosin 0.4 MG Caps capsule Commonly known as: FLOMAX Take 1 capsule (0.4 mg total) by mouth daily. Start taking on: Feb 12, 2022   thiamine 100 MG tablet Commonly known as: Vitamin B-1 Take 1 tablet (100 mg total) by mouth daily. What changed: when to take this   torsemide 20 MG tablet Commonly known as: DEMADEX Take 1 tablet (20 mg total) by mouth daily. Start taking on: Feb 12, 2022   Trulicity 0.75 MG/0.5ML Sopn Generic drug: Dulaglutide Inject 0.75 mg into the skin once a week.   Vitamin D3 25 MCG tablet Commonly known as: Vitamin D Take 1 tablet (1,000 Units total) by mouth daily. What changed: when to take this        Discharge Instructions: Please refer to Patient Instructions section of EMR for full details.  Patient was counseled important signs and symptoms that should prompt return to medical care, changes in medications, dietary instructions, activity restrictions, and follow up appointments.   Follow-Up Appointments:  Follow-up Information     Jeffrey Nip, MD. Schedule  an appointment as soon as possible for a visit in 1 week(s).   Specialty: Family Medicine Why: Follow up with your primary care doctor within about one week of hospital discharge. Contact information: [address] [place] [address] [phone]  Towanda Octave, MD 02/11/2022, 4:41 PM PGY-3, [facility]

## 2022-02-11 NOTE — Progress Notes (Signed)
Received verbal order for 5 units sq insulin aspart once per dr. Posey Pronto.  ? ?Lavenia Atlas, RN ? ?

## 2022-02-11 NOTE — Progress Notes (Signed)
Inpatient Diabetes Program Recommendations ? ?AACE/ADA: New Consensus Statement on Inpatient Glycemic Control (2015) ? ?Target Ranges:  Prepandial:   less than 140 mg/dL ?     Peak postprandial:   less than 180 mg/dL (1-2 hours) ?     Critically ill patients:  140 - 180 mg/dL  ? ?Lab Results  ?Component Value Date  ? GLUCAP 287 (H) 02/11/2022  ? HGBA1C 8.3 (H) 02/02/2022  ? ? ?Review of Glycemic Control ? Latest Reference Range & Units 02/10/22 05:34 02/10/22 12:26 02/10/22 17:07 02/10/22 20:35 02/11/22 05:16 02/11/22 08:12  ?Glucose-Capillary 70 - 99 mg/dL 355 (H) ? ?Novolog 5 units given late at [phone] 398 (H) ? ?Novolog 5 units given ? ? ?Hydrocortisone 10 mg  288 (H) ? ?Novolog 3 units ? ? ? ?Hydrocortisone 5 mg 339 (H) ? ?Novolog 4 units 387 (H) ? ?Novolog 5 units 287 (H) ? ?Novolog 3 units ? ? ?Hydrocortisone 10 mg  ? ?Diabetes history: DM 2 ?Outpatient Diabetes medications: Trulicity 3.89 mg/wkly, Metformin 500 mg bid ?Current orders for Inpatient glycemic control:  ?Novolog 3 units tid ? ?Cortef 10 mg qam, 5 mg qpm ?Boost Breeze tid between meals ? ?Inpatient Diabetes Program Recommendations:   ? ?Hyperglycemia due to supplement and steroid doses ?-  may consider adding Novolog 0-6 units tid + hs scale in addition to the Novolog meal coverage doses. ? ?Thanks, ? ?Tama Headings RN, MSN, BC-ADM ?Inpatient Diabetes Coordinator ?Team Pager [phone] (8a-5p) ?

## 2022-02-12 LAB — C3 COMPLEMENT: C3 Complement: 83 mg/dL (ref 82–167)

## 2022-02-12 LAB — ANA: Anti Nuclear Antibody (ANA): NEGATIVE

## 2022-02-12 LAB — C4 COMPLEMENT: Complement C4, Body Fluid: 29 mg/dL (ref 12–38)

## 2022-02-12 NOTE — Telephone Encounter (Signed)
Left message for pt to call back  °

## 2022-02-13 LAB — PROTEIN ELECTROPHORESIS, SERUM
A/G Ratio: 0.6 — ABNORMAL LOW (ref 0.7–1.7)
Albumin ELP: 1.9 g/dL — ABNORMAL LOW (ref 2.9–4.4)
Alpha-1-Globulin: 0.3 g/dL (ref 0.0–0.4)
Alpha-2-Globulin: 0.7 g/dL (ref 0.4–1.0)
Beta Globulin: 1.1 g/dL (ref 0.7–1.3)
Gamma Globulin: 1 g/dL (ref 0.4–1.8)
Globulin, Total: 3.1 g/dL (ref 2.2–3.9)
Total Protein ELP: 5 g/dL — ABNORMAL LOW (ref 6.0–8.5)

## 2022-02-13 LAB — ANCA PROFILE
Anti-MPO Antibodies: <0.2 units (ref 0.0–0.9)
Anti-PR3 Antibodies: <0.2 units (ref 0.0–0.9)
Atypical P-ANCA titer: <1:20 {titer}
C-ANCA: <1:20 {titer}
P-ANCA: <1:20 {titer}

## 2022-02-13 NOTE — Telephone Encounter (Signed)
Left message for pt to call back  °

## 2022-02-14 NOTE — Telephone Encounter (Signed)
Left message for pt to call back  °

## 2022-02-15 ENCOUNTER — Telehealth: Payer: Self-pay | Admitting: *Deleted

## 2022-02-15 ENCOUNTER — Ambulatory Visit: Payer: Commercial Managed Care - HMO | Admitting: Family Medicine

## 2022-02-15 NOTE — Telephone Encounter (Signed)
PCP requested a call to reschedule missed AM appt.   ? ?No answer at either number. ? ?LMOVM of cell number. ? ?Will await callback. Christen Bame, CMA ? ?

## 2022-02-15 NOTE — Telephone Encounter (Signed)
Pt is active on My Chart: My Chart message sent to pt with date and time of the appointment

## 2022-02-20 ENCOUNTER — Emergency Department (HOSPITAL_COMMUNITY): Payer: Commercial Managed Care - HMO

## 2022-02-20 ENCOUNTER — Inpatient Hospital Stay (HOSPITAL_COMMUNITY)
Admission: EM | Admit: 2022-02-20 | Discharge: 2022-03-07 | DRG: 682 | Disposition: E | Payer: Commercial Managed Care - HMO | Attending: Family Medicine | Admitting: Family Medicine

## 2022-02-20 ENCOUNTER — Inpatient Hospital Stay (HOSPITAL_COMMUNITY): Payer: Commercial Managed Care - HMO

## 2022-02-20 DIAGNOSIS — N184 Chronic kidney disease, stage 4 (severe): Secondary | ICD-10-CM | POA: Diagnosis not present

## 2022-02-20 DIAGNOSIS — E869 Volume depletion, unspecified: Secondary | ICD-10-CM | POA: Diagnosis present

## 2022-02-20 DIAGNOSIS — E43 Unspecified severe protein-calorie malnutrition: Secondary | ICD-10-CM | POA: Diagnosis present

## 2022-02-20 DIAGNOSIS — E1122 Type 2 diabetes mellitus with diabetic chronic kidney disease: Secondary | ICD-10-CM | POA: Diagnosis present

## 2022-02-20 DIAGNOSIS — E1165 Type 2 diabetes mellitus with hyperglycemia: Secondary | ICD-10-CM | POA: Diagnosis present

## 2022-02-20 DIAGNOSIS — E7800 Pure hypercholesterolemia, unspecified: Secondary | ICD-10-CM | POA: Diagnosis present

## 2022-02-20 DIAGNOSIS — G931 Anoxic brain damage, not elsewhere classified: Secondary | ICD-10-CM | POA: Diagnosis present

## 2022-02-20 DIAGNOSIS — K2210 Ulcer of esophagus without bleeding: Secondary | ICD-10-CM | POA: Diagnosis present

## 2022-02-20 DIAGNOSIS — K767 Hepatorenal syndrome: Secondary | ICD-10-CM | POA: Diagnosis not present

## 2022-02-20 DIAGNOSIS — R001 Bradycardia, unspecified: Secondary | ICD-10-CM

## 2022-02-20 DIAGNOSIS — D631 Anemia in chronic kidney disease: Secondary | ICD-10-CM | POA: Diagnosis present

## 2022-02-20 DIAGNOSIS — K219 Gastro-esophageal reflux disease without esophagitis: Secondary | ICD-10-CM | POA: Diagnosis present

## 2022-02-20 DIAGNOSIS — F1010 Alcohol abuse, uncomplicated: Secondary | ICD-10-CM | POA: Diagnosis present

## 2022-02-20 DIAGNOSIS — I8510 Secondary esophageal varices without bleeding: Secondary | ICD-10-CM | POA: Diagnosis present

## 2022-02-20 DIAGNOSIS — E8809 Other disorders of plasma-protein metabolism, not elsewhere classified: Secondary | ICD-10-CM

## 2022-02-20 DIAGNOSIS — K7031 Alcoholic cirrhosis of liver with ascites: Secondary | ICD-10-CM | POA: Diagnosis present

## 2022-02-20 DIAGNOSIS — I129 Hypertensive chronic kidney disease with stage 1 through stage 4 chronic kidney disease, or unspecified chronic kidney disease: Secondary | ICD-10-CM | POA: Diagnosis present

## 2022-02-20 DIAGNOSIS — Z66 Do not resuscitate: Secondary | ICD-10-CM | POA: Diagnosis not present

## 2022-02-20 DIAGNOSIS — M7989 Other specified soft tissue disorders: Secondary | ICD-10-CM | POA: Diagnosis not present

## 2022-02-20 DIAGNOSIS — T68XXXA Hypothermia, initial encounter: Principal | ICD-10-CM

## 2022-02-20 DIAGNOSIS — G40909 Epilepsy, unspecified, not intractable, without status epilepticus: Secondary | ICD-10-CM | POA: Diagnosis present

## 2022-02-20 DIAGNOSIS — L8990 Pressure ulcer of unspecified site, unspecified stage: Secondary | ICD-10-CM | POA: Insufficient documentation

## 2022-02-20 DIAGNOSIS — J9602 Acute respiratory failure with hypercapnia: Secondary | ICD-10-CM | POA: Diagnosis present

## 2022-02-20 DIAGNOSIS — D649 Anemia, unspecified: Secondary | ICD-10-CM

## 2022-02-20 DIAGNOSIS — Z638 Other specified problems related to primary support group: Secondary | ICD-10-CM

## 2022-02-20 DIAGNOSIS — R579 Shock, unspecified: Secondary | ICD-10-CM

## 2022-02-20 DIAGNOSIS — N170 Acute kidney failure with tubular necrosis: Principal | ICD-10-CM | POA: Diagnosis present

## 2022-02-20 DIAGNOSIS — J9601 Acute respiratory failure with hypoxia: Secondary | ICD-10-CM | POA: Diagnosis present

## 2022-02-20 DIAGNOSIS — Z20822 Contact with and (suspected) exposure to covid-19: Secondary | ICD-10-CM | POA: Diagnosis present

## 2022-02-20 DIAGNOSIS — J90 Pleural effusion, not elsewhere classified: Secondary | ICD-10-CM

## 2022-02-20 DIAGNOSIS — R578 Other shock: Secondary | ICD-10-CM | POA: Diagnosis present

## 2022-02-20 DIAGNOSIS — G9340 Encephalopathy, unspecified: Secondary | ICD-10-CM | POA: Diagnosis not present

## 2022-02-20 DIAGNOSIS — E0590 Thyrotoxicosis, unspecified without thyrotoxic crisis or storm: Secondary | ICD-10-CM | POA: Diagnosis present

## 2022-02-20 DIAGNOSIS — R012 Other cardiac sounds: Secondary | ICD-10-CM | POA: Diagnosis not present

## 2022-02-20 DIAGNOSIS — E2740 Unspecified adrenocortical insufficiency: Secondary | ICD-10-CM | POA: Diagnosis present

## 2022-02-20 DIAGNOSIS — R7989 Other specified abnormal findings of blood chemistry: Secondary | ICD-10-CM

## 2022-02-20 DIAGNOSIS — E039 Hypothyroidism, unspecified: Secondary | ICD-10-CM | POA: Diagnosis present

## 2022-02-20 DIAGNOSIS — E861 Hypovolemia: Secondary | ICD-10-CM

## 2022-02-20 DIAGNOSIS — K760 Fatty (change of) liver, not elsewhere classified: Secondary | ICD-10-CM | POA: Diagnosis present

## 2022-02-20 DIAGNOSIS — N179 Acute kidney failure, unspecified: Secondary | ICD-10-CM | POA: Diagnosis not present

## 2022-02-20 DIAGNOSIS — R4182 Altered mental status, unspecified: Secondary | ICD-10-CM | POA: Diagnosis present

## 2022-02-20 DIAGNOSIS — N1831 Chronic kidney disease, stage 3a: Secondary | ICD-10-CM | POA: Diagnosis present

## 2022-02-20 DIAGNOSIS — E8770 Fluid overload, unspecified: Secondary | ICD-10-CM | POA: Diagnosis not present

## 2022-02-20 DIAGNOSIS — Z515 Encounter for palliative care: Secondary | ICD-10-CM

## 2022-02-20 DIAGNOSIS — R34 Anuria and oliguria: Secondary | ICD-10-CM | POA: Diagnosis not present

## 2022-02-20 DIAGNOSIS — R918 Other nonspecific abnormal finding of lung field: Secondary | ICD-10-CM

## 2022-02-20 DIAGNOSIS — F17290 Nicotine dependence, other tobacco product, uncomplicated: Secondary | ICD-10-CM | POA: Diagnosis present

## 2022-02-20 DIAGNOSIS — G4733 Obstructive sleep apnea (adult) (pediatric): Secondary | ICD-10-CM | POA: Diagnosis present

## 2022-02-20 DIAGNOSIS — G9341 Metabolic encephalopathy: Secondary | ICD-10-CM | POA: Diagnosis present

## 2022-02-20 LAB — I-STAT ARTERIAL BLOOD GAS, ED
Acid-base deficit: 5 mmol/L — ABNORMAL HIGH (ref 0.0–2.0)
Bicarbonate: 21.8 mmol/L (ref 20.0–28.0)
Calcium, Ion: 1.11 mmol/L — ABNORMAL LOW (ref 1.15–1.40)
HCT: 30 % — ABNORMAL LOW (ref 39.0–52.0)
Hemoglobin: 10.2 g/dL — ABNORMAL LOW (ref 13.0–17.0)
O2 Saturation: 91 %
Potassium: 3.8 mmol/L (ref 3.5–5.1)
Sodium: 139 mmol/L (ref 135–145)
TCO2: 23 mmol/L (ref 22–32)
pCO2 arterial: 48.3 mmHg — ABNORMAL HIGH (ref 32–48)
pH, Arterial: 7.262 — ABNORMAL LOW (ref 7.35–7.45)
pO2, Arterial: 69 mmHg — ABNORMAL LOW (ref 83–108)

## 2022-02-20 LAB — COMPREHENSIVE METABOLIC PANEL
ALT: 57 U/L — ABNORMAL HIGH (ref 0–44)
AST: 93 U/L — ABNORMAL HIGH (ref 15–41)
Albumin: 1.5 g/dL — ABNORMAL LOW (ref 3.5–5.0)
Alkaline Phosphatase: 159 U/L — ABNORMAL HIGH (ref 38–126)
Anion gap: 7 (ref 5–15)
BUN: 52 mg/dL — ABNORMAL HIGH (ref 6–20)
CO2: 22 mmol/L (ref 22–32)
Calcium: 7.1 mg/dL — ABNORMAL LOW (ref 8.9–10.3)
Chloride: 111 mmol/L (ref 98–111)
Creatinine, Ser: 3.55 mg/dL — ABNORMAL HIGH (ref 0.61–1.24)
GFR, Estimated: 19 mL/min — ABNORMAL LOW (ref >60)
Glucose, Bld: 166 mg/dL — ABNORMAL HIGH (ref 70–99)
Potassium: 3.9 mmol/L (ref 3.5–5.1)
Sodium: 140 mmol/L (ref 135–145)
Total Bilirubin: 0.4 mg/dL (ref 0.3–1.2)
Total Protein: 4.9 g/dL — ABNORMAL LOW (ref 6.5–8.1)

## 2022-02-20 LAB — I-STAT CHEM 8, ED
BUN: 48 mg/dL — ABNORMAL HIGH (ref 6–20)
Calcium, Ion: 0.92 mmol/L — ABNORMAL LOW (ref 1.15–1.40)
Chloride: 108 mmol/L (ref 98–111)
Creatinine, Ser: 3.6 mg/dL — ABNORMAL HIGH (ref 0.61–1.24)
Glucose, Bld: 165 mg/dL — ABNORMAL HIGH (ref 70–99)
HCT: 25 % — ABNORMAL LOW (ref 39.0–52.0)
Hemoglobin: 8.5 g/dL — ABNORMAL LOW (ref 13.0–17.0)
Potassium: 3.8 mmol/L (ref 3.5–5.1)
Sodium: 138 mmol/L (ref 135–145)
TCO2: 21 mmol/L — ABNORMAL LOW (ref 22–32)

## 2022-02-20 LAB — CBC
HCT: 26.1 % — ABNORMAL LOW (ref 39.0–52.0)
Hemoglobin: 8.6 g/dL — ABNORMAL LOW (ref 13.0–17.0)
MCH: 28.8 pg (ref 26.0–34.0)
MCHC: 33 g/dL (ref 30.0–36.0)
MCV: 87.3 fL (ref 80.0–100.0)
Platelets: 258 10*3/uL (ref 150–400)
RBC: 2.99 MIL/uL — ABNORMAL LOW (ref 4.22–5.81)
RDW: 19.9 % — ABNORMAL HIGH (ref 11.5–15.5)
WBC: 6.1 10*3/uL (ref 4.0–10.5)
nRBC: 0.3 % — ABNORMAL HIGH (ref 0.0–0.2)

## 2022-02-20 LAB — RESP PANEL BY RT-PCR (FLU A&B, COVID) ARPGX2
Influenza A by PCR: NEGATIVE
Influenza B by PCR: NEGATIVE
SARS Coronavirus 2 by RT PCR: NEGATIVE

## 2022-02-20 LAB — TSH: TSH: 16.64 u[IU]/mL — ABNORMAL HIGH (ref 0.350–4.500)

## 2022-02-20 LAB — TROPONIN I (HIGH SENSITIVITY): Troponin I (High Sensitivity): 10 ng/L (ref <18)

## 2022-02-20 LAB — AMMONIA: Ammonia: 29 umol/L (ref 9–35)

## 2022-02-20 LAB — CK: Total CK: 214 U/L (ref 49–397)

## 2022-02-20 LAB — GLUCOSE, CAPILLARY: Glucose-Capillary: 179 mg/dL — ABNORMAL HIGH (ref 70–99)

## 2022-02-20 LAB — PROTIME-INR
INR: 1.1 (ref 0.8–1.2)
Prothrombin Time: 14.5 seconds (ref 11.4–15.2)

## 2022-02-20 LAB — PROCALCITONIN: Procalcitonin: <0.1 ng/mL

## 2022-02-20 LAB — LACTIC ACID, PLASMA: Lactic Acid, Venous: 0.8 mmol/L (ref 0.5–1.9)

## 2022-02-20 LAB — T4, FREE: Free T4: 1.14 ng/dL — ABNORMAL HIGH (ref 0.61–1.12)

## 2022-02-20 MED ORDER — EPINEPHRINE HCL 5 MG/250ML IV SOLN IN NS
INTRAVENOUS | Status: AC
  Filled 2022-02-20: qty 250

## 2022-02-20 MED ORDER — CHLORHEXIDINE GLUCONATE CLOTH 2 % EX PADS
6.0000 | MEDICATED_PAD | Freq: Every day | CUTANEOUS | Status: DC
  Administered 2022-02-21 – 2022-02-23 (×3): 6 via TOPICAL

## 2022-02-20 MED ORDER — FIDAXOMICIN 200 MG PO TABS
200.0000 mg | ORAL_TABLET | Freq: Two times a day (BID) | ORAL | Status: DC
  Filled 2022-02-20 (×2): qty 1

## 2022-02-20 MED ORDER — HYDROCORTISONE SOD SUC (PF) 100 MG IJ SOLR
100.0000 mg | Freq: Once | INTRAMUSCULAR | Status: AC
  Administered 2022-02-20: 100 mg via INTRAVENOUS
  Filled 2022-02-20: qty 2

## 2022-02-20 MED ORDER — ATROPINE SULFATE 1 MG/10ML IJ SOSY
PREFILLED_SYRINGE | INTRAMUSCULAR | Status: AC
  Administered 2022-02-20: 1 mg via INTRAVENOUS
  Filled 2022-02-20: qty 10

## 2022-02-20 MED ORDER — DOPAMINE-DEXTROSE 3.2-5 MG/ML-% IV SOLN
5.0000 ug/kg/min | INTRAVENOUS | Status: DC
  Administered 2022-02-20: 5 ug/kg/min via INTRAVENOUS

## 2022-02-20 MED ORDER — INSULIN ASPART 100 UNIT/ML IJ SOLN
0.0000 [IU] | INTRAMUSCULAR | Status: DC
  Administered 2022-02-20: 3 [IU] via SUBCUTANEOUS
  Administered 2022-02-21: 5 [IU] via SUBCUTANEOUS
  Administered 2022-02-21: 3 [IU] via SUBCUTANEOUS
  Administered 2022-02-21: 2 [IU] via SUBCUTANEOUS
  Administered 2022-02-21: 5 [IU] via SUBCUTANEOUS
  Administered 2022-02-22 (×3): 3 [IU] via SUBCUTANEOUS

## 2022-02-20 MED ORDER — LACTATED RINGERS IV BOLUS: 1000.0000 mL | Freq: Once | INTRAVENOUS | Status: DC

## 2022-02-20 MED ORDER — DOCUSATE SODIUM 100 MG PO CAPS: 100.0000 mg | ORAL_CAPSULE | Freq: Two times a day (BID) | ORAL | Status: DC | PRN

## 2022-02-20 MED ORDER — CALCIUM GLUCONATE-NACL 1-0.675 GM/50ML-% IV SOLN
1.0000 g | Freq: Once | INTRAVENOUS | Status: AC
  Administered 2022-02-20: 1000 mg via INTRAVENOUS
  Filled 2022-02-20: qty 50

## 2022-02-20 MED ORDER — GLUCAGON HCL RDNA (DIAGNOSTIC) 1 MG IJ SOLR
5.0000 mg | Freq: Once | INTRAVENOUS | Status: AC
  Administered 2022-02-20: 5 mg via INTRAVENOUS
  Filled 2022-02-20: qty 5

## 2022-02-20 MED ORDER — ATROPINE SULFATE 1 MG/ML IV SOLN: 0.4000 mg | Freq: Once | INTRAVENOUS | Status: DC

## 2022-02-20 MED ORDER — DOPAMINE-DEXTROSE 3.2-5 MG/ML-% IV SOLN
0.0000 ug/kg/min | INTRAVENOUS | Status: DC
  Administered 2022-02-20: 10 ug/kg/min via INTRAVENOUS
  Administered 2022-02-21: 4 ug/kg/min via INTRAVENOUS
  Filled 2022-02-20: qty 250

## 2022-02-20 MED ORDER — HEPARIN SODIUM (PORCINE) 5000 UNIT/ML IJ SOLN
5000.0000 [IU] | Freq: Three times a day (TID) | INTRAMUSCULAR | Status: DC
  Administered 2022-02-20 – 2022-02-23 (×8): 5000 [IU] via SUBCUTANEOUS
  Filled 2022-02-20 (×8): qty 1

## 2022-02-20 MED ORDER — LACTATED RINGERS IV BOLUS
1000.0000 mL | Freq: Once | INTRAVENOUS | Status: AC
  Administered 2022-02-20: 1000 mL via INTRAVENOUS

## 2022-02-20 MED ORDER — HYDROCORTISONE SOD SUC (PF) 100 MG IJ SOLR
100.0000 mg | Freq: Four times a day (QID) | INTRAMUSCULAR | Status: DC
  Administered 2022-02-21 – 2022-02-23 (×8): 100 mg via INTRAVENOUS
  Filled 2022-02-20 (×11): qty 2

## 2022-02-20 MED ORDER — PANTOPRAZOLE SODIUM 40 MG IV SOLR
40.0000 mg | Freq: Two times a day (BID) | INTRAVENOUS | Status: DC
  Administered 2022-02-20 – 2022-02-23 (×6): 40 mg via INTRAVENOUS
  Filled 2022-02-20 (×6): qty 10

## 2022-02-20 MED ORDER — DEXTROSE-NACL 5-0.45 % IV SOLN: INTRAVENOUS | Status: DC

## 2022-02-20 MED ORDER — ATROPINE SULFATE 1 MG/10ML IJ SOSY: 1.0000 mg | PREFILLED_SYRINGE | Freq: Once | INTRAMUSCULAR | Status: AC

## 2022-02-20 MED ORDER — POLYETHYLENE GLYCOL 3350 17 G PO PACK: 17.0000 g | PACK | Freq: Every day | ORAL | Status: DC | PRN

## 2022-02-20 MED ORDER — SODIUM CHLORIDE 0.9 % IV SOLN: 1.0000 g | Freq: Once | INTRAVENOUS | Status: DC

## 2022-02-20 MED ORDER — SODIUM CHLORIDE 0.9 % IV SOLN: 500.0000 mg | Freq: Once | INTRAVENOUS | Status: DC

## 2022-02-20 NOTE — ED Notes (Signed)
Pt placed on Bair hugger for rectal temp of 86.4 ?

## 2022-02-20 NOTE — Consult Note (Addendum)
NAME:  Jeffrey [name], MRN:  [id number], DOB:  1966-01-23, LOS: 0 ADMISSION DATE:  02/13/2022, CONSULTATION DATE:  02/22/2022  REFERRING MD:  Dr Ashok Cordia ER, CHIEF COMPLAINT:  Circulatory shock, bradycardia, hypothermia   History of Present Illness:  56 year old obese male who has had 4 admissions to the hospital since December 2022.  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  This was in the backdrop of esophagitis gastritis ulcers and esophageal varices.  He also had sepsis secondary to UTI and associated hypothermia.  This admission he was discharged home  Readmitted mid-to-late March for close to 10 days for presyncope and falls and hypoglycemia 300 but not DKA.  With a creatinine of 1.8 [baseline 0.7-1.0 mg percent] mg percent hemoglobin 7.6.  This time upper and lower endoscopies were unremarkable.  Ejection fraction was 65%.  He did have relative adrenal insufficiency on cortisol stimulation test.  He also was found to have hematuria diagnosis of presumed C. difficile colitis was made.  He was discharged on 10 days of the daptomycin outpatient endocrinology and MRI for of the brain for pituitary lesions was recommended  Readmitted for a week around mid April 2023 for fall following a seizure after drinking a 12 pack beer.  Had hypothermia on arrival.  During this admission also had Klebsiella UTI.  Repeat ACTH stimulation test was again abnormal for relative adrenal insufficiency.  ACTH level, morning renin aldosterone level and GAD65 sent  Readmitted  for around 10 days late April to early May 2023 and discharged 02/11/2022.  Admitted for melena with a hemoglobin around 8 g%.  Had repeat EGD with esophagitis portal hypertensive gastropathy and single localized erosion of the duodenal bulb and multiple nonbleeding superficial duodenal ulcers is status post 1 unit PRBC and discharged on PPI.  He was also noticed to have moderate ascites this admission.  Status post paracentesis 1.6 L, is on ceftriaxone for SBP prophylaxis.  For his relative adrenal insufficiency was started on hydrocortisone 15 mg in the morning and 10 mg at night he also had AKI on admission with a creatinine of 3.6 mg percent with Fina less than 0.1% consistent with prerenal.  No hydronephrosis.  He was discharged on torsemide.  Creatinine was 3.46 with a GFR of 28 discharge he was also started on propranolol at the time of discharge.  He was discharged to a skilled nursing facility.  Readmitted on 02/16/2022 via the ER after being found down.  Found to be hypothermic on arrival along with severe sinus bradycardia and hypotension.  Responded to fluids with improvement in mental status.  Requiring dopamine to maintain a heart rate of 60 and systolic blood pressure 332.  Unclear downtime in the nursing home.  But pattern seems to be consistent with prior admissions.  CCM called for admission.  Protecting airway but not on the ventilator 02/05/2022.  Cardiologist suspects medical cause of bradycardia.  Pacemaker not indicated.  ER RN - says he smells like "c diff"  Past Medical History:    has a past medical history of Acute renal insufficiency (12/08/2012), AKI (acute kidney injury) ([place]), ALCOHOL ABUSE,  HX OF (11/10/2007), Anoxic encephalopathy ([place]), Anxiety, Bleeding ulcer (2014), Blood transfusion (2014), Cardiac arrest ([place]) (12/07/2012), GERD (gastroesophageal reflux disease), High cholesterol, Hypertension, OSA on CPAP (12/07/2012), Seizures ([place]), Type II diabetes mellitus ([place]), and  Venous insufficiency (12/13/2011).   reports that he has never smoked. His smokeless tobacco use includes snuff.  Past Surgical History:  Procedure Laterality Date   BIOPSY  11/01/2020   Procedure: BIOPSY;  Surgeon: Irene Shipper, MD;  Location: [facility] ENDOSCOPY;  Service: Endoscopy;;   BIOPSY  02/03/2022   Procedure: BIOPSY;  Surgeon: Ladene Artist, MD;  Location: [facility] ENDOSCOPY;  Service: Gastroenterology;;   CARDIOVASCULAR STRESS TEST  03/16/13   Very Poor Exercise Tolerance; NON DIAGNOSTIC TEST   CATARACT EXTRACTION W/ INTRAOCULAR LENS IMPLANT Left 03/07/2014   [place] [facility] WITH PROPOFOL N/A 12/23/2021   Procedure: COLONOSCOPY WITH PROPOFOL;  Surgeon: Irene Shipper, MD;  Location: [facility];  Service: Gastroenterology;  Laterality: N/A;   ESOPHAGOGASTRODUODENOSCOPY Left 11/26/2012   Procedure: ESOPHAGOGASTRODUODENOSCOPY (EGD);  Surgeon: Wonda Horner, MD;  Location: [facility] ENDOSCOPY;  Service: Endoscopy;  Laterality: Left;   ESOPHAGOGASTRODUODENOSCOPY N/A 10/10/2014   Procedure: ESOPHAGOGASTRODUODENOSCOPY (EGD);  Surgeon: Ladene Artist, MD;  Location: [facility] ENDOSCOPY;  Service: Endoscopy;  Laterality: N/A;   ESOPHAGOGASTRODUODENOSCOPY (EGD) WITH PROPOFOL N/A 11/01/2020   Procedure: ESOPHAGOGASTRODUODENOSCOPY (EGD) WITH PROPOFOL;  Surgeon: Irene Shipper, MD;  Location: [facility] ENDOSCOPY;  Service: Endoscopy;  Laterality: N/A;   ESOPHAGOGASTRODUODENOSCOPY (EGD) WITH PROPOFOL N/A 12/22/2021   Procedure: ESOPHAGOGASTRODUODENOSCOPY (EGD) WITH PROPOFOL;  Surgeon: Mauri Pole, MD;  Location: [name];  Service: Gastroenterology;  Laterality: N/A;   ESOPHAGOGASTRODUODENOSCOPY (EGD) WITH PROPOFOL N/A 02/03/2022   Procedure: ESOPHAGOGASTRODUODENOSCOPY (EGD) WITH PROPOFOL;  Surgeon: Ladene Artist, MD;  Location: [place];  Service: Gastroenterology;  Laterality: N/A;   KNEE ARTHROSCOPY Left ~ 1999   UPPER GASTROINTESTINAL ENDOSCOPY      Allergies  Allergen  Reactions   Atorvastatin Other (See Comments)    Cause Muscle Weakness   Lisinopril Other (See Comments)    Pt reports nose bleeds   Losartan Other (See Comments)    Patient stopped taking this because he felt like passing out after standing up   Drug Ingredient [Spironolactone] Other (See Comments)    Hyperkalemia    Immunization History  Administered Date(s) Administered   Influenza Split 06/27/2011, 08/07/2012   Influenza Whole 11/01/2008   Influenza,inj,Quad PF,6+ Mos 08/06/2013, 11/23/2015, 06/19/2016, 07/30/2017, 11/02/2018, 06/02/2019, 07/25/2020, 08/09/2021   Moderna Sars-Covid-2 Vaccination 12/24/2019, 01/21/2020   PNEUMOCOCCAL CONJUGATE-20 06/08/2021   Pneumococcal Polysaccharide-23 05/27/2013, 06/11/2017   Td 05/08/1999   Tdap 06/27/2011   Zoster Recombinat (Shingrix) 07/15/2020    Family History  Problem Relation Age of Onset   Other Mother        Unsure of medical history.   Diabetes Mellitus II Father    Colon polyps Neg Hx    Colon cancer Neg Hx    Esophageal cancer Neg Hx    Rectal cancer Neg Hx    Stomach cancer Neg Hx      Current Facility-Administered Medications:    atropine 1 MG/10ML injection 1 mg, 1 mg, Intravenous, Once, Lajean Saver, MD   atropine 1 MG/10ML injection, , , ,    DOPamine (INTROPIN) 800 mg in dextrose 5 % 250 mL (3.2 mg/mL) infusion, 0-20 mcg/kg/min, Intravenous, Titrated, Lajean Saver, MD, Last Rate: 23.1 mL/hr at 02/10/2022 1910, 10 mcg/kg/min at 03/06/2022 1910   EPINEPHrine NaCl 5-0.9  MG/250ML-% premix infusion, , , ,    hydrocortisone sodium succinate (SOLU-CORTEF) 100 MG injection 100 mg, 100 mg, Intravenous, Once, Lajean Saver, MD  Current Outpatient Medications:    amLODipine (NORVASC) 5 MG tablet, Take 1 tablet (5 mg total) by mouth daily., Disp: 30 tablet, Rfl: 0   blood glucose meter kit and supplies, Dispense based on patient and insurance preference. Use up to four times daily as directed. (FOR ICD-10 E10.9, E11.9).  dispense 90 test strips and 90 lancets., Disp: 1 each, Rfl: 0   calcium carbonate (OS-CAL - DOSED IN MG OF ELEMENTAL CALCIUM) 1250 (500 Ca) MG tablet, Take 1 tablet (500 mg of elemental calcium total) by mouth daily with breakfast., Disp: 30 tablet, Rfl: 0   Continuous Blood Gluc Sensor (FREESTYLE LIBRE 2 SENSOR) MISC, Use as directed, Disp: 2 each, Rfl: 3   Dulaglutide (TRULICITY) 1.97 JO/8.3GP SOPN, Inject 0.75 mg into the skin once a week., Disp: 2 mL, Rfl: 1   Ensure Max Protein (ENSURE MAX PROTEIN) LIQD, Take 330 mLs (11 oz total) by mouth 3 (three) times daily between meals. (Patient not taking: Reported on 02/02/2022), Disp: , Rfl:    ferrous sulfate 325 (65 FE) MG tablet, Take 1 tablet (325 mg total) by mouth every other day. (Patient taking differently: Take 325 mg by mouth daily with breakfast.), Disp: 30 tablet, Rfl: 0   folic acid (FOLVITE) 1 MG tablet, Take 1 tablet by mouth once daily (Patient not taking: Reported on 02/02/2022), Disp: 90 tablet, Rfl: 0   hydrocortisone (CORTEF) 10 MG tablet, Take 1 tablet (10 mg total) by mouth in the morning., Disp: 30 tablet, Rfl: 0   hydrocortisone (CORTEF) 5 MG tablet, Take 1 tablet (5 mg total) by mouth every evening., Disp: , Rfl:    insulin aspart (NOVOLOG) 100 UNIT/ML injection, Inject 3 Units into the skin with breakfast, with lunch, and with evening meal., Disp: 10 mL, Rfl: 11   levETIRAcetam (KEPPRA) 500 MG tablet, Take 1 tablet (500 mg total) by mouth 2 (two) times daily., Disp: 30 tablet, Rfl: 0   mirtazapine (REMERON) 30 MG tablet, Take 1 tablet (30 mg total) by mouth at bedtime., Disp: 90 tablet, Rfl: 3   Multiple Vitamin (MULTIVITAMIN WITH MINERALS) TABS tablet, Take 1 tablet by mouth daily. (Patient taking differently: Take 1 tablet by mouth at bedtime.), Disp: , Rfl:    pantoprazole (PROTONIX) 40 MG tablet, Take 1 tablet (40 mg total) by mouth 2 (two) times daily. (Patient taking differently: Take 40 mg by mouth at bedtime.), Disp: 60  tablet, Rfl: 1   propranolol (INDERAL) 40 MG tablet, Take 1 tablet (40 mg total) by mouth 3 (three) times daily., Disp: 30 tablet, Rfl: 0   rosuvastatin (CRESTOR) 5 MG tablet, Take 1 tablet (5 mg total) by mouth daily., Disp: 30 tablet, Rfl: 0   tamsulosin (FLOMAX) 0.4 MG CAPS capsule, Take 1 capsule (0.4 mg total) by mouth daily., Disp: 30 capsule, Rfl: 0   thiamine (VITAMIN B-1) 100 MG tablet, Take 1 tablet (100 mg total) by mouth daily. (Patient taking differently: Take 100 mg by mouth at bedtime.), Disp: 30 tablet, Rfl: 12   torsemide (DEMADEX) 20 MG tablet, Take 1 tablet (20 mg total) by mouth daily., Disp: 30 tablet, Rfl: 0   Vitamin D3 (VITAMIN D) 25 MCG tablet, Take 1 tablet (1,000 Units total) by mouth daily. (Patient taking differently: Take 1,000 Units by mouth at bedtime.), Disp: 30 tablet, Rfl: 0  Significant Hospital Events:  02/27/2022 - admit   Interim History / Subjective:   03/02/2022 - seen in seen in ER  Objective   Blood pressure 114/72, pulse 66, temperature (!) 86.4 F (30.2 C), temperature source Rectal, resp. rate (!) 9, SpO2 100 %.       No intake or output data in the 24 hours ending 02/07/2022 2039 There were no vitals filed for this visit.  Examination: General: Morbidly obese male. Has BAIR HUGGER HENT: Mallampatti Class 4 Lungs: CTA  . No distress Cardiovascular: Sinus . On dopamine Abdomen: obese Extremities: intact with chroic endema Neuro: Drowsy buttrying to follow commands. Does not move limbs. RASS -2 equivaleunt GU: no foley. Penis looks intact  Resolved Hospital Problem list   x  Assessment & Plan:    Probably undiagnosed sleep apnea at baseline  02/12/2022 -> protecting airway at admission.  At risk for intubation secondary to encephalopathy  P:   Check ABG Oxygen for pulse ox greater than 92%       History of seizure disorder -on antiepileptics ?  Partially ethanol related History of anoxic brain injury not otherwise  specified - Prior to & Present on Admit (although reported as driving as of late 3005] History of episodes of acute encephalopathy -with admissions Acute obtunded encephalopathy -currently Present on Admit   - 02/24/2022 obtunded and comatose at admission but improving with correction of hypotension.  Also cirrhosis puts him at risk of encephalopathy  P:   Check ABG Check ammonia level Might need spot EEG depending on course Consider lactulose when more awake v placing panda     History Hypertension - Prior to & Present on Admit - on norvasc, inderal, l, demadex History of hypotension -with other admissions associated with dehydration and relative adrenal insufficiency  Current  - Circulatory shock  - Present on Admit  associated with volume depletion and adrenal insufficiency - Severe bradycardia ? Due to BEta blocker and Caclium channel blocker- Present on Admit. Needed atropine and then dopamine gtt in ER.     - 02/24/2022: Improving with fluids but needing dopamine.  D/w Dr Virgina Jock - beta blocker /calcium channel OD is within realm of possibilty  P:  Hold INderal Hold Norvasc Hold demadex Glucagon x 1 dose x $Remo'5mg'iyKmw$  (d/ w Dr Virgina Jock) Pressors for MAP greater than 65 -dopamine because of bradycardia as well - Currently dopamine via peripheral vein -> can consider levophed Address adrenal issues and endocrinology section    History of cardiac arrest in the past not other specified - Prior to & Present on Admit Sinus bradycardia profound - Present on Admit Prlonged QTc -627 msec - Present on Admit    - 02/13/2022 requiring dopamine and currently heart rate 70s  P: Continue dopamine for heart rate greater than 55 and  MAP > 65 Monitor with volume resuscitation Monitor with stress dose steroids Avoid Qtc prolnagion meds - recheck EKG      Previous history of sepsis and UTI and C. difficile recurrent Hypothermia with shock at admission raises the concern for  sepsis  5/17 - ER RN reports concern for C Diff saying he smells like c diff  P:   Panculture Check sepsis biomarkers Check stool C diff  Start empiric Rx for C Diff - pharmacy consult Antibiotics depending on culture results and procalcitonin - hold off on start currently because of C. difficile history   Progressive chronic kidney disease - Present on Admit (baseline creatinine 1 mg percent in  February 2023, 1.3 mg percent March 2023, 1.5 mg percent April 2023 and 3.58 mg percent early May 2023].  Renal ultrasound in 2020 without hydronephrosis  03/01/2022 -similar to recent May 2023 baseline.  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  No active GI bleeding  P:   PPI Recheck ultrasound liver   Anemia of chronic disease   P:  - PRBC for hgb </= 6.9gm%    - exceptions are   -  if ACS susepcted/confirmed then transfuse for hgb </= 8.0gm%,  or    -  active bleeding with hemodynamic instability, then transfuse regardless of hemoglobin value   At at all times try to transfuse 1 unit prbc as possible with exception of active hemorrhage     Type 2 diabetes mellitus  Present on Admit History of confirmed relative adrenal insufficiency -started on hydrocortisone in spring 2023 - Prior to & Present on Admit   02/09/2022 -might be in adrenal crisis related shock (hypothermia)  P:   Stress dose steroids  MSK/DERM Obesity with anasarca Severe protein calorie malnutrition Resident  skilled nursing rehab facility   Best practice (daily eval):  Diet: npo Pain/Anxiety/Delirium protocol (if indicated): none VAP protocol (if indicated): none DVT prophylaxis: Heparin GI prophylaxis: ppi Glucose control: ssi Mobility: bed rest Code Status: full code  Disposition: ER to ICU    Family Updates: [phone] -friend Jeffrey [name] listed as contacted. Called 9:18 PM 02/24/2022 =but went to VM and LMTCB -[phone] on assumption he will be in [place]   The patient YASSER HEPP is critically ill with multiple organ systems failure and requires high complexity decision making for assessment and support, frequent evaluation and titration of therapies, application of advanced monitoring technologies and extensive interpretation of multiple databases.   Critical Care Time devoted to patient care services described in this note is  90  Minutes. This time reflects time of care of this signee Dr Brand Males. This critical care time does not reflect procedure time, or teaching time or supervisory time of PA/NP/Med student/Med Resident etc but could involve care discussion time      SIGNATURE    Dr. Brand Males, M.D., F.C.C.P,  Pulmonary and Critical Care Medicine Staff Physician, [place] Director - Interstitial Lung Disease  Program  Pulmonary [place] at [place], [place], [address]  NPI Number:  NPI #[id number]  Pager: [phone], If no answer  -> Check AMION or Try [phone] Telephone (clinical office): [phone] Telephone (research): [phone]  8:39 PM 03/06/2022   02/06/2022 8:39 PM    LABS    PULMONARY Recent Labs  Lab 03/03/2022 1900  TCO2 21*    CBC Recent Labs  Lab 02/09/2022 1900 02/18/2022 1955  HGB 8.5* 8.6*  HCT 25.0* 26.1*  WBC  --  6.1  PLT  --  258  COAGULATION Recent Labs  Lab 03/01/2022 1956  INR 1.1    CARDIAC   No results for input(s): TROPONINI in the last 168 hours. No results for input(s): PROBNP in the last 168 hours.  Latest Reference Range & Units 04/03/20 08:45 04/03/20 10:20 11/17/20 14:45 11/17/20 17:04 07/29/21 16:10 07/29/21 22:49 09/13/21 20:42 09/14/21 08:19 09/14/21 09:43 12/20/21 16:48 12/20/21 19:15 03/05/2022 19:55  Troponin I (High Sensitivity) <18 ng/L $Remove'11 10 11 11 23 'dkoKYfu$ (H) 26 (H) 244 (HH) 86 (H) 83 (H) $Remo'17 15 10  'yYAMJ$ (HH): Data is critically high (H): Data is abnormally high CHEMISTRY Recent Labs  Lab 02/07/2022 1900 03/06/2022 1955  NA 138 140  K 3.8 3.9  CL 108 111  CO2  --  22  GLUCOSE 165* 166*  BUN 48* 52*  CREATININE 3.60* 3.55*  CALCIUM  --  7.1*   Estimated Creatinine Clearance: 30.2 mL/min (A) (by C-G formula based on SCr of 3.55 mg/dL (H)).   LIVER Recent Labs  Lab 02/05/2022 1955 02/05/2022 1956  AST 93*  --   ALT 57*  --   ALKPHOS 159*  --   BILITOT 0.4  --   PROT 4.9*  --   ALBUMIN 1.5*  --   INR  --  1.1     INFECTIOUS Recent Labs  Lab 02/07/2022 1957  LATICACIDVEN 0.8     ENDOCRINE CBG (last 3)  No results for input(s): GLUCAP in the last 72 hours.       IMAGING x48h  - image(s) personally visualized  -   highlighted in bold CT Head Wo Contrast  Result Date: 02/19/2022 CLINICAL DATA:  Found down, hypotension EXAM: CT HEAD WITHOUT CONTRAST TECHNIQUE: Contiguous axial images were obtained from the base of the skull through the vertex without intravenous contrast. RADIATION DOSE REDUCTION: This exam was performed according to the departmental dose-optimization program which includes automated exposure control, adjustment of the mA and/or kV according to patient size and/or use of iterative reconstruction technique. COMPARISON:  MRI brain dated 01/24/2022 FINDINGS: Brain: No evidence of acute infarction, hemorrhage, hydrocephalus, extra-axial collection or mass lesion/mass effect. Mild cortical atrophy. Vascular: No hyperdense vessel or unexpected  calcification. Skull: Normal. Negative for fracture or focal lesion. Sinuses/Orbits: The visualized paranasal sinuses are essentially clear. The mastoid air cells are unopacified. Other: None. IMPRESSION: No evidence of acute intracranial abnormality. Mild cortical atrophy. Electronically Signed   By: Julian Hy M.D.   On: 02/22/2022 20:23   DG Chest Port 1 View  Result Date: 02/19/2022 CLINICAL DATA:  altered mental status EXAM: PORTABLE CHEST 1 VIEW COMPARISON:  Radiograph dated February 02, 2022 FINDINGS: Evaluation is limited secondary to multiple overlapping structures. Enlarged cardiomediastinal silhouette. At least moderate RIGHT pleural effusion. Hazy opacities bilaterally, increased since prior. No large pneumothorax. Degenerative changes of the thoracic spine. IMPRESSION: 1. At least moderate RIGHT pleural effusion. 2. Hazy opacities bilaterally with differential considerations including edema or multifocal infection. 3. Cardiomegaly. Electronically Signed   By: Valentino Saxon M.D.   On: 02/21/2022 20:01

## 2022-02-20 NOTE — Consult Note (Signed)
CARDIOLOGY CONSULT NOTE  ?Patient ID: ?Jeffrey [name] ?MRN: [id number] ?DOB/AGE: [birth year] 56 y.o. ? ?Admit date: 02/19/2022 ?Referring Physician: [name] ?Reason for Consultation:  Bradycardia ? ?HPI:  ? ?56 y.o. African-American male  with advanced CKD, hypertension, type II DM, OSA, h/o alcohol abuse, h/o seizures, was found down in his facility today.  He was brought to [facility], ER.  He was found to be bradycardic in heart rate of 30 bpm along with hypotension 60/30 mmHg.  He was also found to be in altered mental status, as well as hypothermia. ? ?I was contacted by ER physician Dr. Ashok Cordia to evaluate if this is symptomatic bradycardia and if patient will require temporary pacemaker.  Patient does not respond to any questions to me.  He is on their hugger due to his hypothermia.  He is currently on dopamine with resting heart rate in 50s, and continues to be hypotensive. ? ?Past Medical History:  ?Diagnosis Date  ? Acute renal insufficiency 12/08/2012  ? AKI (acute kidney injury) ([place])   ? ALCOHOL ABUSE, HX OF 11/10/2007  ? Anoxic encephalopathy ([place])   ? Anxiety   ? Bleeding ulcer 2014  ? Blood transfusion 2014  ? "related to bleeding ulcer"  ? Cardiac arrest ([place]) 12/07/2012  ? Anoxic encephalopathy  ? GERD (gastroesophageal reflux disease)   ? High cholesterol   ? Hypertension   ? OSA on CPAP 12/07/2012  ? Seizures ([place])   ? last seizure 01/05/2021  ? Type II diabetes mellitus ([place])   ? Venous insufficiency 12/13/2011  ?  ? ?Past Surgical History:  ?Procedure Laterality Date  ? BIOPSY  11/01/2020  ? Procedure: BIOPSY;  Surgeon: Irene Shipper, MD;  Location: [facility] ENDOSCOPY;  Service: Endoscopy;;  ? BIOPSY  02/03/2022  ? Procedure: BIOPSY;  Surgeon: Ladene Artist, MD;  Location: [place];  Service: Gastroenterology;;  ? CARDIOVASCULAR STRESS TEST  03/16/13  ? Very Poor Exercise Tolerance; NON DIAGNOSTIC TEST  ? CATARACT EXTRACTION W/ INTRAOCULAR LENS IMPLANT Left 03/07/2014  ? [place] [facility]  ? COLONOSCOPY WITH PROPOFOL N/A 12/23/2021  ? Procedure: COLONOSCOPY WITH PROPOFOL;  Surgeon: Irene Shipper, MD;  Location: [place];  Service: Gastroenterology;  Laterality: N/A;  ? ESOPHAGOGASTRODUODENOSCOPY Left 11/26/2012  ? Procedure: ESOPHAGOGASTRODUODENOSCOPY (EGD);  Surgeon: Wonda Horner, MD;  Location: [facility] ENDOSCOPY;  Service: Endoscopy;  Laterality: Left;  ? ESOPHAGOGASTRODUODENOSCOPY N/A 10/10/2014  ? Procedure: ESOPHAGOGASTRODUODENOSCOPY (EGD);  Surgeon: Ladene Artist, MD;  Location: [facility] ENDOSCOPY;  Service: Endoscopy;  Laterality: N/A;  ? ESOPHAGOGASTRODUODENOSCOPY (EGD) WITH PROPOFOL N/A 11/01/2020  ? Procedure: ESOPHAGOGASTRODUODENOSCOPY (EGD) WITH PROPOFOL;  Surgeon: Irene Shipper, MD;  Location: [facility] ENDOSCOPY;  Service: Endoscopy;  Laterality: N/A;  ? ESOPHAGOGASTRODUODENOSCOPY (EGD) WITH PROPOFOL N/A 12/22/2021  ? Procedure: ESOPHAGOGASTRODUODENOSCOPY (EGD) WITH PROPOFOL;  Surgeon: Mauri Pole, MD;  Location: [place];  Service: Gastroenterology;  Laterality: N/A;  ? ESOPHAGOGASTRODUODENOSCOPY (EGD) WITH PROPOFOL N/A 02/03/2022  ? Procedure: ESOPHAGOGASTRODUODENOSCOPY (EGD) WITH PROPOFOL;  Surgeon: Ladene Artist, MD;  Location: [place];  Service: Gastroenterology;  Laterality: N/A;  ? KNEE ARTHROSCOPY Left ~ 1999  ? UPPER GASTROINTESTINAL ENDOSCOPY    ?  ? ? ?Family History  ?Problem Relation Age of Onset  ? Other Mother   ?     Unsure of medical history.  ? Diabetes Mellitus II Father   ? Colon polyps Neg Hx   ? Colon cancer Neg Hx   ? Esophageal cancer Neg  Hx   ? Rectal cancer Neg Hx   ? Stomach cancer Neg Hx   ?  ? ?Social History: ?Social History  ? ?Socioeconomic History  ? Marital status: Married  ?  Spouse name: Not on file  ? Number of children: 0  ? Years of education: Bachelors  ? Highest education level: Not on file  ?Occupational History  ? Occupation: maintenance tech  ?Tobacco Use  ? Smoking status: Never  ? Smokeless tobacco: Current  ?  Types: Snuff  ?Vaping  Use  ? Vaping Use: Never used  ?Substance and Sexual Activity  ? Alcohol use: Yes  ?  Comment: 12 pack beer / week  ? Drug use: No  ? Sexual activity: Yes  ?Other Topics Concern  ? Not on file  ?Social History Narrative  ? Lives at home with wife. He has four stepchildren.   ? Right-handed.  ? No caffeine use.  ? ?Social Determinants of Health  ? ?Financial Resource Strain: Not on file  ?Food Insecurity: Not on file  ?Transportation Needs: Not on file  ?Physical Activity: Not on file  ?Stress: Not on file  ?Social Connections: Not on file  ?Intimate Partner Violence: Not on file  ?  ? ?(Not in a hospital admission) ? ? ?Review of Systems  ?Unable to perform ROS: Mental status change  ?Cardiovascular:  Negative for chest pain, dyspnea on exertion, leg swelling, palpitations and syncope.  ?  ? ?Physical Exam: ?Physical Exam ?Vitals and nursing note reviewed.  ?Constitutional:   ?   General: He is not in acute distress. ?   Appearance: He is ill-appearing.  ?Neck:  ?   Vascular: No JVD.  ?Cardiovascular:  ?   Rate and Rhythm: Normal rate and regular rhythm.  ?   Heart sounds: Normal heart sounds. No murmur heard. ?Pulmonary:  ?   Effort: Pulmonary effort is normal.  ?   Breath sounds: Normal breath sounds. No wheezing or rales.  ?Musculoskeletal:  ?   Right lower leg: Edema (2+) present.  ?   Left lower leg: Edema (2+) present.  ?Neurological:  ?   Mental Status: He is lethargic and confused.  ? ? ? ?  ?Imaging/tests reviewed and independently interpreted: ?Lab Results: ?CBC, BMP ? ?Cardiac Studies: ? ?Telemetry 02/19/2022: ?Sinus bradycardia lowest at 36 bpm. ?Currently in sinus bradycardia >50 bpm. ? ?EKG 02/08/2022: ?Probable sinus bradycardia 49 bpm ?IVCD ?Low voltage ? ?Echocardiogram 01/21/2022: ? 1. Left ventricular ejection fraction, by estimation, is 60 to 65%. The  ?left ventricle has normal function. The left ventricle has no regional  ?wall motion abnormalities. There is mild concentric left ventricular   ?hypertrophy.  ? 2. Right ventricular systolic function is normal. The right ventricular  ?size is normal.  ? 3. The mitral valve is normal in structure. Trivial mitral valve  ?regurgitation. No evidence of mitral stenosis.  ? 4. The aortic valve is tricuspid. There is mild calcification of the  ?aortic valve. Aortic valve regurgitation is not visualized. No aortic  ?stenosis is present.  ? 5. Aortic dilatation noted. There is borderline dilatation of the  ?ascending aorta, measuring 38 mm.  ? ?Comparison(s): No significant change from prior study.  ? ? ?Assessment & Recommendations: ? ?56 y.o. African-American male  with advanced CKD, hypertension, type II DM, OSA, h/o alcohol abuse, h/o seizures, admitted with altered mental status, cardiology consulted for bradycardia. ? ?Bradycardia: ?Sinus bradycardia in 30s on arrival, improved to 50s on low-dose dopamine. ?No sinus  pause/sinus arrest, high-grade AV block. ?I suspect sinus bradycardia as consequence of secondary cause that is likely causing his hypotension, hypothermia, as well as encephalopathy.  Sepsis is on differential. ?I do not think sinus bradycardia in 50s could explain his hypotension, hypothermia, and encephalopathy. ?There is no acute indication for temporary pacemaker placement. ?Continue supportive management as per critical care. ? ?Patient was recently seen by Dr. Marlou Porch, and previously established with [facility] heart care.  I was contacted as interventionalist on-call.  Defer further cardiac consultation to [facility] heart care. ? ?CRITICAL CARE ?Performed by: Vernell Leep ?  ?Total critical care time: 35 minutes ?  ?Critical care time was exclusive of separately billable procedures and treating other patients. ?  ?Critical care was necessary to treat or prevent imminent or life-threatening deterioration. ?  ?Critical care was time spent personally by me on the following activities: development of treatment plan with patient and/or surrogate as  well as nursing, discussions with consultants, evaluation of patient's response to treatment, examination of patient, obtaining history from patient or surrogate, ordering and performing treatments and interve

## 2022-02-20 NOTE — ED Notes (Signed)
Phlebotomy at bedside to obtain 2nd set of blood cultures.  ?

## 2022-02-20 NOTE — ED Provider Notes (Signed)
?[facility] ?Provider Note ? ? ?CSN: [id number] ?Arrival date & time: 03/02/2022  1835 ? ?  ? ?History ? ?Chief Complaint  ?Patient presents with  ? Bradycardia  ? ? ?Jeffrey [name] is a 56 y.o. male. ? ?Patient presents via EMS from SNF. EMS was called to facility for fall - they indicate it appeared pt had rolled out of bed to floor and was face down, decreased responsiveness, and was noted to bradycardic and hypotensive. EMS transported to ED.  Pt limited responsiveness, level 5 caveat.  ? ?The history is provided by the patient, medical records and the EMS personnel. The history is limited by the condition of the patient.  ? ?  ? ?Home Medications ?Prior to Admission medications   ?Medication Sig Start Date End Date Taking? Authorizing Provider  ?amLODipine (NORVASC) 5 MG tablet Take 1 tablet (5 mg total) by mouth daily. 02/12/22   Lattie Haw, MD  ?blood glucose meter kit and supplies Dispense based on patient and insurance preference. Use up to four times daily as directed. (FOR ICD-10 E10.9, E11.9). dispense 90 test strips and 90 lancets. 09/17/21   Samuella Cota, MD  ?calcium carbonate (OS-CAL - DOSED IN MG OF ELEMENTAL CALCIUM) 1250 (500 Ca) MG tablet Take 1 tablet (500 mg of elemental calcium total) by mouth daily with breakfast. ?Patient not taking: Reported on 01/08/2022 01/01/22 01/31/22  Carollee Leitz, MD  ?Continuous Blood Gluc Sensor (FREESTYLE LIBRE 2 SENSOR) MISC Use as directed ?Patient not taking: Reported on 12/20/2021 10/02/21   [name], MD  ?Dulaglutide (TRULICITY) 4.94 WH/6.7RF SOPN Inject 0.75 mg into the skin once a week. 11/29/21   Leavy Cella, RPH-CPP  ?Ensure Max Protein (ENSURE MAX PROTEIN) LIQD Take 330 mLs (11 oz total) by mouth 3 (three) times daily between meals. ?Patient not taking: Reported on 02/02/2022 08/10/21   Holley Bouche, MD  ?ferrous sulfate 325 (65 FE) MG tablet Take 1 tablet (325 mg total) by mouth every other  day. ?Patient taking differently: Take 325 mg by mouth daily with breakfast. 01/02/22   Carollee Leitz, MD  ?folic acid (FOLVITE) 1 MG tablet Take 1 tablet by mouth once daily ?Patient not taking: Reported on 02/02/2022 10/02/21   [name], MD  ?hydrocortisone (CORTEF) 10 MG tablet Take 1 tablet (10 mg total) by mouth in the morning. 02/12/22   Lattie Haw, MD  ?hydrocortisone (CORTEF) 5 MG tablet Take 1 tablet (5 mg total) by mouth every evening. 02/11/22   Lattie Haw, MD  ?insulin aspart (NOVOLOG) 100 UNIT/ML injection Inject 3 Units into the skin with breakfast, with lunch, and with evening meal. 02/11/22   Lattie Haw, MD  ?levETIRAcetam (KEPPRA) 500 MG tablet Take 1 tablet (500 mg total) by mouth 2 (two) times daily. 02/11/22   Lattie Haw, MD  ?mirtazapine (REMERON) 30 MG tablet Take 1 tablet (30 mg total) by mouth at bedtime. 01/21/22   [name], MD  ?Multiple Vitamin (MULTIVITAMIN WITH MINERALS) TABS tablet Take 1 tablet by mouth daily. ?Patient taking differently: Take 1 tablet by mouth at bedtime. 08/11/21   Holley Bouche, MD  ?pantoprazole (PROTONIX) 40 MG tablet Take 1 tablet (40 mg total) by mouth 2 (two) times daily. ?Patient taking differently: Take 40 mg by mouth at bedtime. 09/17/21 02/02/22  Samuella Cota, MD  ?propranolol (INDERAL) 40 MG tablet Take 1 tablet (40 mg total) by mouth 3 (three) times daily. 02/11/22   Lattie Haw, MD  ?rosuvastatin ([place])  5 MG tablet Take 1 tablet (5 mg total) by mouth daily. 02/12/22   Lattie Haw, MD  ?tamsulosin (FLOMAX) 0.4 MG CAPS capsule Take 1 capsule (0.4 mg total) by mouth daily. 02/12/22   Lattie Haw, MD  ?thiamine (VITAMIN B-1) 100 MG tablet Take 1 tablet (100 mg total) by mouth daily. ?Patient taking differently: Take 100 mg by mouth at bedtime. 05/27/16   [name], DO  ?torsemide (DEMADEX) 20 MG tablet Take 1 tablet (20 mg total) by mouth daily. 02/12/22   Lattie Haw, MD  ?Vitamin D3 (VITAMIN D) 25 MCG tablet Take 1 tablet  (1,000 Units total) by mouth daily. ?Patient taking differently: Take 1,000 Units by mouth at bedtime. 01/01/22   Carollee Leitz, MD  ?   ? ?Allergies    ?Atorvastatin, Lisinopril, Losartan, and Drug ingredient [spironolactone]   ? ?Review of Systems   ?Review of Systems  ?Unable to perform ROS: Patient unresponsive  ?Level 5 caveat, pt unresponsive verbally.  ? ?Physical Exam ?Updated Vital Signs ?BP (!) 63/50   Temp (!) 86.4 ?F (30.2 ?C) (Rectal)   Resp (!) 28  ?Physical Exam ?Vitals and nursing note reviewed.  ?Constitutional:   ?   Appearance: He is well-developed.  ?   Comments: Eyes open, alert appearing, slow to respond. Bradycardic, hypotensive.   ?HENT:  ?   Head: Atraumatic.  ?   Nose: Nose normal.  ?   Mouth/Throat:  ?   Mouth: Mucous membranes are moist.  ?   Pharynx: Oropharynx is clear.  ?Eyes:  ?   General: No scleral icterus. ?   Conjunctiva/sclera: Conjunctivae normal.  ?   Pupils: Pupils are equal, round, and reactive to light.  ?Neck:  ?   Trachea: No tracheal deviation.  ?Cardiovascular:  ?   Rate and Rhythm: Bradycardia present.  ?   Heart sounds: No murmur heard. ?  No friction rub. No gallop.  ?Pulmonary:  ?   Effort: Pulmonary effort is normal. No accessory muscle usage or respiratory distress.  ?   Breath sounds: Normal breath sounds.  ?Abdominal:  ?   General: Bowel sounds are normal. There is no distension.  ?   Palpations: Abdomen is soft.  ?   Tenderness: There is no abdominal tenderness.  ?Genitourinary: ?   Comments: No cva tenderness. ?Musculoskeletal:     ?   General: No tenderness.  ?   Cervical back: Normal range of motion and neck supple. No rigidity.  ?Skin: ?   General: Skin is warm and dry.  ?   Findings: No rash.  ?Neurological:  ?   Comments: Alert appearing. Decreased responsiveness/slow to respond.  ? ? ?ED Results / Procedures / Treatments   ?Labs ?(all labs ordered are listed, but only abnormal results are displayed) ?Results for orders placed or performed during the  hospital encounter of 03/04/2022  ?CBC  ?Result Value Ref Range  ? WBC 6.1 4.0 - 10.5 K/uL  ? RBC 2.99 (L) 4.22 - 5.81 MIL/uL  ? Hemoglobin 8.6 (L) 13.0 - 17.0 g/dL  ? HCT 26.1 (L) 39.0 - 52.0 %  ? MCV 87.3 80.0 - 100.0 fL  ? MCH 28.8 26.0 - 34.0 pg  ? MCHC 33.0 30.0 - 36.0 g/dL  ? RDW 19.9 (H) 11.5 - 15.5 %  ? Platelets 258 150 - 400 K/uL  ? nRBC 0.3 (H) 0.0 - 0.2 %  ?Comprehensive metabolic panel  ?Result Value Ref Range  ? Sodium 140 135 - 145  mmol/L  ? Potassium 3.9 3.5 - 5.1 mmol/L  ? Chloride 111 98 - 111 mmol/L  ? CO2 22 22 - 32 mmol/L  ? Glucose, Bld 166 (H) 70 - 99 mg/dL  ? BUN 52 (H) 6 - 20 mg/dL  ? Creatinine, Ser 3.55 (H) 0.61 - 1.24 mg/dL  ? Calcium 7.1 (L) 8.9 - 10.3 mg/dL  ? Total Protein 4.9 (L) 6.5 - 8.1 g/dL  ? Albumin 1.5 (L) 3.5 - 5.0 g/dL  ? AST 93 (H) 15 - 41 U/L  ? ALT 57 (H) 0 - 44 U/L  ? Alkaline Phosphatase 159 (H) 38 - 126 U/L  ? Total Bilirubin 0.4 0.3 - 1.2 mg/dL  ? GFR, Estimated 19 (L) >60 mL/min  ? Anion gap 7 5 - 15  ?Lactic acid, plasma  ?Result Value Ref Range  ? Lactic Acid, Venous 0.8 0.5 - 1.9 mmol/L  ?TSH  ?Result Value Ref Range  ? TSH 16.640 (H) 0.350 - 4.500 uIU/mL  ?T4, free  ?Result Value Ref Range  ? Free T4 1.14 (H) 0.61 - 1.12 ng/dL  ?Procalcitonin  ?Result Value Ref Range  ? Procalcitonin <0.10 ng/mL  ?CK  ?Result Value Ref Range  ? Total CK 214 49 - 397 U/L  ?Protime-INR  ?Result Value Ref Range  ? Prothrombin Time 14.5 11.4 - 15.2 seconds  ? INR 1.1 0.8 - 1.2  ?I-stat chem 8, ED  ?Result Value Ref Range  ? Sodium 138 135 - 145 mmol/L  ? Potassium 3.8 3.5 - 5.1 mmol/L  ? Chloride 108 98 - 111 mmol/L  ? BUN 48 (H) 6 - 20 mg/dL  ? Creatinine, Ser 3.60 (H) 0.61 - 1.24 mg/dL  ? Glucose, Bld 165 (H) 70 - 99 mg/dL  ? Calcium, Ion 0.92 (L) 1.15 - 1.40 mmol/L  ? TCO2 21 (L) 22 - 32 mmol/L  ? Hemoglobin 8.5 (L) 13.0 - 17.0 g/dL  ? HCT 25.0 (L) 39.0 - 52.0 %  ?I-Stat arterial blood gas, ED  ?Result Value Ref Range  ? pH, Arterial 7.262 (L) 7.35 - 7.45  ? pCO2 arterial 48.3 (H) 32  - 48 mmHg  ? pO2, Arterial 69 (L) 83 - 108 mmHg  ? Bicarbonate 21.8 20.0 - 28.0 mmol/L  ? TCO2 23 22 - 32 mmol/L  ? O2 Saturation 91 %  ? Acid-base deficit 5.0 (H) 0.0 - 2.0 mmol/L  ? Sodium 139 135 - 145 mmol/L  ? Pot

## 2022-02-20 NOTE — ED Notes (Addendum)
Repeat rectal temp remains 86.4  Bair hugger continued on high . ?

## 2022-02-20 NOTE — ED Triage Notes (Signed)
BIB EMS from facility where pt is reportedly undergoing rehab.  [place],  [place]  Staff reported low BP this morning. Pt pressure did improve with meds but next staff reported pt found on floor, altered, with HR of 30.  Per EMS pt could not get any consistent rhythm nor feel peripheral pulses. Given 2.5 versed during attempt at pacing but were unable to get capture. Pt is responsive to voice. Staff from facility reported hx of brain injury in the past as well as CPR in the past.  ?

## 2022-02-20 NOTE — ED Notes (Signed)
Obtained 1 set blood cultures.  Unable to obtain 2nd set at this time.  ?

## 2022-02-21 ENCOUNTER — Inpatient Hospital Stay (HOSPITAL_COMMUNITY): Payer: Commercial Managed Care - HMO

## 2022-02-21 ENCOUNTER — Other Ambulatory Visit (HOSPITAL_COMMUNITY): Payer: Commercial Managed Care - HMO

## 2022-02-21 DIAGNOSIS — E8809 Other disorders of plasma-protein metabolism, not elsewhere classified: Secondary | ICD-10-CM

## 2022-02-21 DIAGNOSIS — M7989 Other specified soft tissue disorders: Secondary | ICD-10-CM

## 2022-02-21 DIAGNOSIS — R012 Other cardiac sounds: Secondary | ICD-10-CM

## 2022-02-21 DIAGNOSIS — L8990 Pressure ulcer of unspecified site, unspecified stage: Secondary | ICD-10-CM | POA: Insufficient documentation

## 2022-02-21 DIAGNOSIS — R4182 Altered mental status, unspecified: Secondary | ICD-10-CM

## 2022-02-21 DIAGNOSIS — R579 Shock, unspecified: Secondary | ICD-10-CM | POA: Diagnosis not present

## 2022-02-21 DIAGNOSIS — T68XXXA Hypothermia, initial encounter: Secondary | ICD-10-CM

## 2022-02-21 DIAGNOSIS — R001 Bradycardia, unspecified: Secondary | ICD-10-CM | POA: Diagnosis not present

## 2022-02-21 DIAGNOSIS — R7989 Other specified abnormal findings of blood chemistry: Secondary | ICD-10-CM

## 2022-02-21 DIAGNOSIS — K7031 Alcoholic cirrhosis of liver with ascites: Secondary | ICD-10-CM

## 2022-02-21 LAB — POCT I-STAT 7, (LYTES, BLD GAS, ICA,H+H)
Acid-base deficit: 8 mmol/L — ABNORMAL HIGH (ref 0.0–2.0)
Acid-base deficit: 3 mmol/L — ABNORMAL HIGH (ref 0.0–2.0)
Bicarbonate: 22.6 mmol/L (ref 20.0–28.0)
Bicarbonate: 22.4 mmol/L (ref 20.0–28.0)
Calcium, Ion: 1.13 mmol/L — ABNORMAL LOW (ref 1.15–1.40)
Calcium, Ion: 1.09 mmol/L — ABNORMAL LOW (ref 1.15–1.40)
HCT: 51 % (ref 39.0–52.0)
HCT: 30 % — ABNORMAL LOW (ref 39.0–52.0)
Hemoglobin: 17.3 g/dL — ABNORMAL HIGH (ref 13.0–17.0)
Hemoglobin: 10.2 g/dL — ABNORMAL LOW (ref 13.0–17.0)
O2 Saturation: 86 %
O2 Saturation: 100 %
Patient temperature: 91.4
Patient temperature: 33.8
Potassium: 3.7 mmol/L (ref 3.5–5.1)
Potassium: 3.8 mmol/L (ref 3.5–5.1)
Sodium: 139 mmol/L (ref 135–145)
Sodium: 136 mmol/L (ref 135–145)
TCO2: 25 mmol/L (ref 22–32)
TCO2: 24 mmol/L (ref 22–32)
pCO2 arterial: 53.7 mmHg — ABNORMAL HIGH (ref 32–48)
pCO2 arterial: 34.7 mmHg (ref 32–48)
pH, Arterial: 7.209 — ABNORMAL LOW (ref 7.35–7.45)
pH, Arterial: 7.403 (ref 7.35–7.45)
pO2, Arterial: 51 mmHg — ABNORMAL LOW (ref 83–108)
pO2, Arterial: 170 mmHg — ABNORMAL HIGH (ref 83–108)

## 2022-02-21 LAB — GLUCOSE, CAPILLARY
Glucose-Capillary: 220 mg/dL — ABNORMAL HIGH (ref 70–99)
Glucose-Capillary: 214 mg/dL — ABNORMAL HIGH (ref 70–99)
Glucose-Capillary: 186 mg/dL — ABNORMAL HIGH (ref 70–99)
Glucose-Capillary: 120 mg/dL — ABNORMAL HIGH (ref 70–99)
Glucose-Capillary: 149 mg/dL — ABNORMAL HIGH (ref 70–99)

## 2022-02-21 LAB — CBC
HCT: 31.1 % — ABNORMAL LOW (ref 39.0–52.0)
Hemoglobin: 9.9 g/dL — ABNORMAL LOW (ref 13.0–17.0)
MCH: 28 pg (ref 26.0–34.0)
MCHC: 31.8 g/dL (ref 30.0–36.0)
MCV: 87.9 fL (ref 80.0–100.0)
Platelets: 231 10*3/uL (ref 150–400)
RBC: 3.54 MIL/uL — ABNORMAL LOW (ref 4.22–5.81)
RDW: 19.8 % — ABNORMAL HIGH (ref 11.5–15.5)
WBC: 7.8 10*3/uL (ref 4.0–10.5)
nRBC: 0.6 % — ABNORMAL HIGH (ref 0.0–0.2)

## 2022-02-21 LAB — BASIC METABOLIC PANEL
Anion gap: 7 (ref 5–15)
BUN: 52 mg/dL — ABNORMAL HIGH (ref 6–20)
CO2: 21 mmol/L — ABNORMAL LOW (ref 22–32)
Calcium: 7.1 mg/dL — ABNORMAL LOW (ref 8.9–10.3)
Chloride: 110 mmol/L (ref 98–111)
Creatinine, Ser: 3.59 mg/dL — ABNORMAL HIGH (ref 0.61–1.24)
GFR, Estimated: 19 mL/min — ABNORMAL LOW (ref >60)
Glucose, Bld: 219 mg/dL — ABNORMAL HIGH (ref 70–99)
Potassium: 3.7 mmol/L (ref 3.5–5.1)
Sodium: 138 mmol/L (ref 135–145)

## 2022-02-21 LAB — ECHOCARDIOGRAM COMPLETE
AR max vel: 4.14 cm2
AV Area VTI: 4.35 cm2
AV Area mean vel: 4 cm2
AV Mean grad: 2 mmHg
AV Peak grad: 3.7 mmHg
Ao pk vel: 0.96 m/s
Area-P 1/2: 2.45 cm2
S' Lateral: 3.1 cm
Weight: 4356.29 oz

## 2022-02-21 LAB — AMMONIA: Ammonia: 73 umol/L — ABNORMAL HIGH (ref 9–35)

## 2022-02-21 LAB — C DIFFICILE QUICK SCREEN W PCR REFLEX
C Diff antigen: POSITIVE — AB
C Diff toxin: NEGATIVE

## 2022-02-21 LAB — MAGNESIUM
Magnesium: 1.3 mg/dL — ABNORMAL LOW (ref 1.7–2.4)
Magnesium: 1.9 mg/dL (ref 1.7–2.4)

## 2022-02-21 LAB — PHOSPHORUS
Phosphorus: 6.5 mg/dL — ABNORMAL HIGH (ref 2.5–4.6)
Phosphorus: 6.3 mg/dL — ABNORMAL HIGH (ref 2.5–4.6)

## 2022-02-21 LAB — CLOSTRIDIUM DIFFICILE BY PCR, REFLEXED: Toxigenic C. Difficile by PCR: POSITIVE — AB

## 2022-02-21 LAB — MRSA NEXT GEN BY PCR, NASAL: MRSA by PCR Next Gen: NOT DETECTED

## 2022-02-21 MED ORDER — POTASSIUM CHLORIDE 10 MEQ/100ML IV SOLN
10.0000 meq | INTRAVENOUS | Status: AC
  Administered 2022-02-21 (×2): 10 meq via INTRAVENOUS
  Filled 2022-02-21 (×2): qty 100

## 2022-02-21 MED ORDER — SODIUM CHLORIDE 0.9 % IV SOLN
250.0000 mL | INTRAVENOUS | Status: DC
  Administered 2022-02-21: 1000 mL via INTRAVENOUS

## 2022-02-21 MED ORDER — NOREPINEPHRINE 4 MG/250ML-% IV SOLN
INTRAVENOUS | Status: AC
  Administered 2022-02-21: 2 ug/min via INTRAVENOUS
  Filled 2022-02-21: qty 250

## 2022-02-21 MED ORDER — SODIUM CHLORIDE 0.9 % IV SOLN: 1.0000 mg | Freq: Every day | INTRAVENOUS | Status: DC

## 2022-02-21 MED ORDER — VITAL HIGH PROTEIN PO LIQD
1000.0000 mL | ORAL | Status: DC
  Administered 2022-02-21 – 2022-02-22 (×3): 1000 mL

## 2022-02-21 MED ORDER — FOLIC ACID 5 MG/ML IJ SOLN
1.0000 mg | Freq: Every day | INTRAMUSCULAR | Status: DC
  Administered 2022-02-21 – 2022-02-23 (×3): 1 mg via INTRAVENOUS
  Filled 2022-02-21 (×3): qty 0.2

## 2022-02-21 MED ORDER — SODIUM CHLORIDE 0.9 % IV SOLN
2.0000 g | INTRAVENOUS | Status: DC
  Administered 2022-02-21 – 2022-02-22 (×2): 2 g via INTRAVENOUS
  Filled 2022-02-21 (×2): qty 20

## 2022-02-21 MED ORDER — THIAMINE HCL 100 MG/ML IJ SOLN
100.0000 mg | Freq: Every day | INTRAMUSCULAR | Status: DC
  Administered 2022-02-21 – 2022-02-23 (×3): 100 mg via INTRAVENOUS
  Filled 2022-02-21 (×3): qty 2

## 2022-02-21 MED ORDER — LEVETIRACETAM IN NACL 500 MG/100ML IV SOLN
500.0000 mg | Freq: Two times a day (BID) | INTRAVENOUS | Status: DC
  Administered 2022-02-21 – 2022-02-23 (×5): 500 mg via INTRAVENOUS
  Filled 2022-02-21 (×5): qty 100

## 2022-02-21 MED ORDER — PROSOURCE TF PO LIQD
45.0000 mL | Freq: Two times a day (BID) | ORAL | Status: DC
  Administered 2022-02-21 – 2022-02-23 (×5): 45 mL
  Filled 2022-02-21 (×5): qty 45

## 2022-02-21 MED ORDER — LACTULOSE 10 GM/15ML PO SOLN
30.0000 g | Freq: Three times a day (TID) | ORAL | Status: DC
  Administered 2022-02-21 – 2022-02-23 (×6): 30 g
  Filled 2022-02-21 (×6): qty 45

## 2022-02-21 MED ORDER — PERFLUTREN LIPID MICROSPHERE
1.0000 mL | INTRAVENOUS | Status: AC | PRN
  Administered 2022-02-21: 4 mL via INTRAVENOUS

## 2022-02-21 MED ORDER — MAGNESIUM SULFATE 4 GM/100ML IV SOLN
4.0000 g | Freq: Once | INTRAVENOUS | Status: AC
  Administered 2022-02-21: 4 g via INTRAVENOUS
  Filled 2022-02-21: qty 100

## 2022-02-21 MED ORDER — NOREPINEPHRINE 4 MG/250ML-% IV SOLN
2.0000 ug/min | INTRAVENOUS | Status: DC
  Administered 2022-02-21 – 2022-02-22 (×2): 8 ug/min via INTRAVENOUS
  Filled 2022-02-21 (×3): qty 250

## 2022-02-21 NOTE — Progress Notes (Signed)
VASCULAR LAB    Bilateral lower extremity venous duplex has been performed.  See CV proc for preliminary results.   Meyli Boice, RVT 02/21/2022, 1:39 PM

## 2022-02-21 NOTE — H&P (Signed)
See consult note from Dr. Chase Caller on 5/17 for full H&P

## 2022-02-21 NOTE — Progress Notes (Signed)
  Echocardiogram 2D Echocardiogram has been performed.  Jeffrey [name] 02/21/2022, 10:08 AM

## 2022-02-21 NOTE — Progress Notes (Signed)
Critical ABG given to RN.

## 2022-02-21 NOTE — Progress Notes (Addendum)
[facility] Progress Note Patient Name: Jeffrey [name] DOB: 04-09-1966 MRN: [id number]   Date of Service  02/21/2022  HPI/Events of Note  Hypoxia - Sat = 81% on 6 L/min [place]. CXR looks "wet". Now on NRB with sat = 85% and RR = 32. He appears too lethargic for NIV.  eICU Interventions  Plan: Hold LR fluid bolus already ordered.  Hold D5 0.45 NaCl IV infusion ordered at 75 mL/hour. Will request that the PCCM ground team evaluate the patient at bedside.      Intervention Category Major Interventions: Hypoxemia - evaluation and management  Jeylin Woodmansee [place] 02/21/2022, 3:31 AM

## 2022-02-21 NOTE — Procedures (Signed)
Patient Name: Jeffrey [name]  MRN: [id number]  Epilepsy Attending: Lora Havens  Referring Physician/Provider: Corey Harold, NP Date: 02/21/2022 Duration: 23.35 mins  Patient history: 56 year old male with seizures who presented with altered mental status.  EEG to evaluate for seizure.  Level of alertness: lethargic   AEDs during EEG study: LEV  Technical aspects: This EEG study was done with scalp electrodes positioned according to the 10-20 International system of electrode placement. Electrical activity was acquired at a sampling rate of '500Hz'$  and reviewed with a high frequency filter of '70Hz'$  and a low frequency filter of '1Hz'$ . EEG data were recorded continuously and digitally stored.   Description: EEG showed continuous generalized 3 to 6 Hz theta-delta slowing. Hyperventilation and photic stimulation were not performed.     ABNORMALITY - Continuous slow, generalized  IMPRESSION: This study is suggestive of moderate to severe diffuse encephalopathy, nonspecific etiology. No seizures or epileptiform discharges were seen throughout the recording.  Annamaria Salah Barbra Sarks

## 2022-02-21 NOTE — Progress Notes (Signed)
EEG completed, results pending. 

## 2022-02-21 NOTE — Progress Notes (Addendum)
Update 4:57pm- CSW received call from patients friend Juanda Crumble who gave CSW number for patients father Walther [place] [phone]. CSW informed RN and MD.   CSW received consult with assist in helping contact family for patient. CSW called and spoke with patients friend Juanda Crumble who reports he reached out to one of patients friends in [place] who he hopes can get in touch with patients father.Juanda Crumble reports he will follow back up with CSW as soon as he hears from him. CSW will continue to follow.

## 2022-02-21 NOTE — IPAL (Signed)
  Interdisciplinary Goals of Care Family Meeting   Date carried out: 02/21/2022  Location of the meeting: Phone conference  Member's involved: Nurse Practitioner, Bedside Registered Nurse, and Family Member or next of kin  Durable Power of Attorney or acting medical decision maker: Cammy Copa (sister) as selected by other family members including the patient's father.     Discussion: We discussed goals of care for Jeffrey [name] .  Initially I discussed his care with the only contact person Juanda Crumble who is a friend. He was able to track down the contact information of Maxamillian's father Lavaris Sexson.  He seems to have some knowledge of his son's history and is very happy to be updated. He does not wish to make decisions while Aayush is unable to make them for himself. He referred me to Quinterrius's sister, Cammy Copa, who lives in [place]. I spoke with Baldo Ash, who is very upset to hear of Rocket's condition, but understands he has been sick for some time. She does not feel prepared to make any decisions regarding [name]'s care or code status at this time. She would like him to remain full code until he is able to discuss with her sisters.   She then called back to the nursing unit after discussing with her sister and articulated goals of care to be DNR to bedside nurse. I then called Baldo Ash to confirm. She indeed would like to set goals of care for [name] to DNR/DNI.   Code status: Full DNR  Disposition: Continue current acute care  Time spent for the meeting: 60 minutes in total.     Georgann Housekeeper, AGACNP-BC [place] for personal pager PCCM on call pager [phone] until 7pm. Please call Elink 7p-7a. [phone]  02/21/2022 6:21 PM

## 2022-02-21 NOTE — Progress Notes (Addendum)
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 He has multiple recent admissions for upper GIB with gastric ulcers and varices, sepsis from UTI vs SBP, adrenal insufficiency, clostridium difficile, and seizure. Most recently dc to SNF. See Consult note for full description of admissions.    Readmitted on 02/17/2022 via the ER after being found down.  Found to be hypothermic on arrival along with severe sinus bradycardia and hypotension.  Responded to fluids with improvement in mental status.  Requiring dopamine to maintain a heart rate of 60 and systolic blood pressure 875.  Unclear downtime in the nursing home.  But pattern seems to be consistent with prior admissions.  CCM called for admission.  Protecting airway but not on the ventilator 02/16/2022.  Cardiologist suspects medical cause of bradycardia.  Pacemaker not indicated.  ER RN - says he smells like "c diff"  Past Medical History:   has a past medical history of Acute renal insufficiency (12/08/2012), AKI (acute kidney injury) ([place]), ALCOHOL ABUSE, HX OF (11/10/2007), Anoxic encephalopathy ([place]), Anxiety, Bleeding ulcer (2014), Blood transfusion (2014), Cardiac arrest ([place]) (12/07/2012), GERD (gastroesophageal reflux disease), High cholesterol, Hypertension, OSA on CPAP (12/07/2012), Seizures ([place]), Type II diabetes mellitus ([place]), and Venous insufficiency (12/13/2011).   Significant Hospital Events:  5/17 admit found down at SNF. Hypotensive, bradycardic in ED. Started on  BiPAP/dopamine.   Interim History / Subjective:  Started on BiPAP overnight for hypoxia and AMS Remains on dopamine 79mcg More awake and alert now, but still not following commands or offering verbal responses.   Objective   Blood pressure 106/61, pulse 69, temperature (!) 93.4 F (34.1 C), resp. rate (!) 30, weight 123.5 kg, SpO2 100 %.    Vent Mode: PCV;BIPAP FiO2 (%):  [60 %] 60 % Set Rate:  [8 bmp] 8 bmp PEEP:  [8 cmH20] 8 cmH20  No intake or output data in the 24 hours ending 02/21/22 0753 Filed Weights   02/21/22 0331  Weight: 123.5 kg    Examination:  General: Obese middle aged male  HENT: [place]/AT, PERRL, no JVD Lungs: Clear bilateral breath sounds Cardiovascular: Borderline brady, regular, no MRG Abdomen: protuberant, Soft, non-tender.  Extremities: 2+ pitting edema in bilateral lower extremities Neuro: awake, alert, not following commands. No verbal response.   Imaging reviewed: RUQ [name] > Fatty liver with probable cirrhotic changes, no gallstone, small ascites and right sided pleural effusion. Patent main portal vein with hepatopetal flow.   CT head > mild cortical atrophy. Non-acute.   Labs reviewed:  Creatinine 3.55, BUN 52, AST 93, ALT 57, Alk phos 159, albumin 1.5, ammonia 73 Hemoglobin 10.2, hct 30, wbc 7.8.  Blood culture pending C-diff pcr pending Urine cx pending COVID flu neg  Resolved Hospital Problem list     Assessment & Plan:    Shock: etiology unclear. Sepsis certainly in he differential. Procalcitonin undetectably low, however. No clear source. Hypothermia certainly playing a role as well. Known history of adrenal insufficiency as well. Probably an element of chronic hypotesion as well, midodrine on home medication profile.  Bradycardia: evaluated by cardiology who feels there  is a metabolic cause. - ICU for hemodynamic monitoring - Dopamine infusion for HR > 60 and MAP > 65 - Repeat EKG - holding home inderal, norvasc, demadex.  - Warming  blanket - given glucagon for ? BB overdose.  - Stress dose steroids in place of home hydrocortisone. Solucortef 100 q 6. - Ceftriaxone to cover SBP, UTI - Restart midodrine once enteral access obtained.  - C. Diff PCR pending. Recent Hx C diff.  No diarrhea documented. DC fidoximicin.   Acute metabolic encephalopathy: known history of cardiac arrest with anoxic injury. Physical exam on recent admission lists alert and oriented x 3. Currently awake but not responding or following commands. Ddx includes hepatic encephalopathy, sz, or encephalopathy 2/2 shock.  - Close monitoring of airway in ICU - EEG - Lactulose  Acute mixed respiratory failure: secondary to encephalopathy and possibly pulmonary edema secondary to IVF resuscitation. Probably a degree on undiagnosed OSA with decompensation as well.  - BiPAP PRN and QHS for now - SpO2 goal 90-95% - Holding diuretics - low threshold for intubation.   QTC prolongation: 628 on admission in the setting of profound bradycardia.  - repeat ekg - avoid QT prolonging medications.   Decompensated cirrhosis: alcoholic vs fatty liver disease. Small ascites on RUQ [name]. INR wnl. T bili 0.4. Ammonia 73.- Seen by [facility] multiple times in the recent past History of GI bleed. No evidence this admission. - Trend LFT - Will place cortrak for lactulose administration  Acute renal failure on CKD IIIa: likely in the setting of shock. Creatinine slowly creeping up over the last few admissions.  - s/p volume resuscitation - torsemide on hold - trend chemistry for now - consider nephrology consult if not getting better.  - Consider foley  Protein calorie malnutrition: severe, diffuse anasarca - Will place cortrak and start TF - dietician consult  Mild hyperthyroid with elevated TSH. T4 1.6 TSH 16. - Trend TSH, T4  DM - CBG monitoring and SSI  Best practice (daily eval):  Diet: NPO Pain/Anxiety/Delirium protocol (if indicated): none VAP protocol  (if indicated): none DVT prophylaxis: Heparin GI prophylaxis: ppi Glucose control: ssi Mobility: bed rest Code Status: full code   [place] care time 60 minutes  Georgann Housekeeper, AGACNP-BC [place] for personal pager PCCM on call pager [phone] until 7pm. Please call Elink 7p-7a. [phone]  02/21/2022 8:59 AM

## 2022-02-21 NOTE — Progress Notes (Deleted)
PCCM INTERVAL PROGRESS NOTE  Spoke with Jeffrey [name]'s father via phone. He seems to have some knowledge of his son's history and is very happy to be updated. He does not wish to make decisions while Jeffrey [name] is unable to make them for himself. He referred me to Jeffrey [name]'s sister, Jeffrey [name], who lives in [place]. I spoke with Jeffrey [name], who is very upset to hear of Jeffrey [name]'s condition, but understands he has been sick for some time. She does not feel prepared to make any decisions regarding Jeffrey [name]'s care or code status at this time. She would like him to remain full code until he is able to discuss with her sisters.   Both father and sister's contact information has been added to the chart  Jeffrey [name] is primary contact   Update: Jeffrey [name] has had a chance to discuss with her sisters and wish to enact DNR/DNI status.   Jeffrey [name], AGACNP-BC [facility]  See Amion for personal pager PCCM on call pager [phone] until 7pm. Please call Elink 7p-7a. [phone]  02/21/2022 5:33 PM

## 2022-02-21 NOTE — Progress Notes (Signed)
PCCM Interval Progress Note  Ground team contacted by [facility] to evaluate patient at bedside for desaturation, hypoxia despite NRB mask.   On bedside evaluation, patient is somewhat lethargic but opens eyes to voice, intermittently following commands and will nod yes/no to questions. On NRB mask with sat 90-91%, tachypneic taking small volume breaths.  Physical Examination: General: Acutely ill-appearing middle-aged man in NAD. HEENT: [place]/AT, anicteric sclera, PERRL, moist mucous membranes. NRB in place. Neuro: Awake, somewhat lethargic.  Responds to verbal stimuli. Following commands intermittently. Moves all 4 extremities spontaneously. +Cough (weak) and +Gag  CV: RRR, no m/g/r. PULM: Breathing even and minimally labored on NRB, tachypneic to high 20s with subpar lung volumes. Lung fields with bibasilar crackles. GI: Soft, nontender, nondistended. Normoactive bowel sounds. Skin: Warm/dry, no rashes.  Given that patient is awake/looking around the room, can intermittently follow commands and CXR appears wet, likely would benefit from BiPAP if able to tolerate. Will obtain ABG and attempt BiPAP in an effort to prevent intubation. If patient failing BiPAP/NIPPV or unable to tolerate, low threshold for intubation.  Lestine Mount, PA-C [facility] 02/21/22 4:10 AM  Please see [email or web address] for pager details.  From 7A-7P if no response, please call [phone] After hours, please call ELink [phone]

## 2022-02-22 ENCOUNTER — Inpatient Hospital Stay (HOSPITAL_COMMUNITY): Payer: Commercial Managed Care - HMO

## 2022-02-22 DIAGNOSIS — N179 Acute kidney failure, unspecified: Secondary | ICD-10-CM | POA: Diagnosis not present

## 2022-02-22 DIAGNOSIS — N184 Chronic kidney disease, stage 4 (severe): Secondary | ICD-10-CM

## 2022-02-22 DIAGNOSIS — R7989 Other specified abnormal findings of blood chemistry: Secondary | ICD-10-CM | POA: Diagnosis not present

## 2022-02-22 DIAGNOSIS — R001 Bradycardia, unspecified: Secondary | ICD-10-CM | POA: Diagnosis not present

## 2022-02-22 DIAGNOSIS — R579 Shock, unspecified: Secondary | ICD-10-CM | POA: Diagnosis not present

## 2022-02-22 LAB — COMPREHENSIVE METABOLIC PANEL
ALT: 44 U/L (ref 0–44)
AST: 44 U/L — ABNORMAL HIGH (ref 15–41)
Albumin: <1.5 g/dL — ABNORMAL LOW (ref 3.5–5.0)
Alkaline Phosphatase: 139 U/L — ABNORMAL HIGH (ref 38–126)
Anion gap: 9 (ref 5–15)
BUN: 56 mg/dL — ABNORMAL HIGH (ref 6–20)
CO2: 20 mmol/L — ABNORMAL LOW (ref 22–32)
Calcium: 7.1 mg/dL — ABNORMAL LOW (ref 8.9–10.3)
Chloride: 110 mmol/L (ref 98–111)
Creatinine, Ser: 3.89 mg/dL — ABNORMAL HIGH (ref 0.61–1.24)
GFR, Estimated: 17 mL/min — ABNORMAL LOW (ref >60)
Glucose, Bld: 211 mg/dL — ABNORMAL HIGH (ref 70–99)
Potassium: 4.2 mmol/L (ref 3.5–5.1)
Sodium: 139 mmol/L (ref 135–145)
Total Bilirubin: 0.5 mg/dL (ref 0.3–1.2)
Total Protein: 4.9 g/dL — ABNORMAL LOW (ref 6.5–8.1)

## 2022-02-22 LAB — CBC
HCT: 24.9 % — ABNORMAL LOW (ref 39.0–52.0)
Hemoglobin: 8 g/dL — ABNORMAL LOW (ref 13.0–17.0)
MCH: 27.9 pg (ref 26.0–34.0)
MCHC: 32.1 g/dL (ref 30.0–36.0)
MCV: 86.8 fL (ref 80.0–100.0)
Platelets: 196 10*3/uL (ref 150–400)
RBC: 2.87 MIL/uL — ABNORMAL LOW (ref 4.22–5.81)
RDW: 19.9 % — ABNORMAL HIGH (ref 11.5–15.5)
WBC: 14.8 10*3/uL — ABNORMAL HIGH (ref 4.0–10.5)
nRBC: 1.4 % — ABNORMAL HIGH (ref 0.0–0.2)

## 2022-02-22 LAB — GLUCOSE, CAPILLARY
Glucose-Capillary: 168 mg/dL — ABNORMAL HIGH (ref 70–99)
Glucose-Capillary: 173 mg/dL — ABNORMAL HIGH (ref 70–99)
Glucose-Capillary: 174 mg/dL — ABNORMAL HIGH (ref 70–99)
Glucose-Capillary: 200 mg/dL — ABNORMAL HIGH (ref 70–99)
Glucose-Capillary: 224 mg/dL — ABNORMAL HIGH (ref 70–99)

## 2022-02-22 LAB — PHOSPHORUS
Phosphorus: 6.6 mg/dL — ABNORMAL HIGH (ref 2.5–4.6)
Phosphorus: 6.7 mg/dL — ABNORMAL HIGH (ref 2.5–4.6)

## 2022-02-22 LAB — TSH: TSH: 7.549 u[IU]/mL — ABNORMAL HIGH (ref 0.350–4.500)

## 2022-02-22 LAB — MAGNESIUM
Magnesium: 1.9 mg/dL (ref 1.7–2.4)
Magnesium: 2 mg/dL (ref 1.7–2.4)

## 2022-02-22 LAB — T4, FREE: Free T4: 0.89 ng/dL (ref 0.61–1.12)

## 2022-02-22 LAB — AMMONIA: Ammonia: 37 umol/L — ABNORMAL HIGH (ref 9–35)

## 2022-02-22 MED ORDER — ORAL CARE MOUTH RINSE
15.0000 mL | Freq: Two times a day (BID) | OROMUCOSAL | Status: DC
  Administered 2022-02-22 – 2022-02-23 (×2): 15 mL via OROMUCOSAL

## 2022-02-22 MED ORDER — MIDODRINE HCL 5 MG PO TABS
10.0000 mg | ORAL_TABLET | Freq: Three times a day (TID) | ORAL | Status: DC
  Administered 2022-02-22 – 2022-02-23 (×3): 10 mg
  Filled 2022-02-22 (×3): qty 2

## 2022-02-22 MED ORDER — MIDODRINE HCL 5 MG PO TABS
5.0000 mg | ORAL_TABLET | Freq: Three times a day (TID) | ORAL | Status: DC
Start: 2022-02-22 — End: 2022-02-22

## 2022-02-22 MED ORDER — NEPRO/CARBSTEADY PO LIQD
1000.0000 mL | ORAL | Status: DC
Start: 2022-02-22 — End: 2022-02-23
  Administered 2022-02-22: 1000 mL
  Filled 2022-02-22: qty 1000

## 2022-02-22 MED ORDER — MIDODRINE HCL 5 MG PO TABS
5.0000 mg | ORAL_TABLET | Freq: Three times a day (TID) | ORAL | Status: DC
  Administered 2022-02-22: 5 mg
  Filled 2022-02-22: qty 1

## 2022-02-22 MED ORDER — MAGNESIUM SULFATE 2 GM/50ML IV SOLN
2.0000 g | Freq: Once | INTRAVENOUS | Status: AC
  Administered 2022-02-22: 2 g via INTRAVENOUS
  Filled 2022-02-22: qty 50

## 2022-02-22 MED ORDER — ALBUMIN HUMAN 25 % IV SOLN
25.0000 g | Freq: Four times a day (QID) | INTRAVENOUS | Status: AC
  Administered 2022-02-22 (×2): 25 g via INTRAVENOUS
  Filled 2022-02-22 (×2): qty 100

## 2022-02-22 MED ORDER — SODIUM CHLORIDE 0.9 % IV SOLN
2.0000 g | INTRAVENOUS | Status: DC
  Administered 2022-02-23: 2 g via INTRAVENOUS
  Filled 2022-02-22: qty 20

## 2022-02-22 MED ORDER — CALCIUM ACETATE (PHOS BINDER) 667 MG/5ML PO SOLN
1334.0000 mg | Freq: Three times a day (TID) | ORAL | Status: DC
  Administered 2022-02-22 – 2022-02-23 (×3): 1334 mg
  Filled 2022-02-22 (×4): qty 10

## 2022-02-22 MED ORDER — INSULIN ASPART 100 UNIT/ML IJ SOLN
0.0000 [IU] | INTRAMUSCULAR | Status: DC
  Administered 2022-02-22 (×2): 4 [IU] via SUBCUTANEOUS
  Administered 2022-02-22: 7 [IU] via SUBCUTANEOUS
  Administered 2022-02-23: 4 [IU] via SUBCUTANEOUS
  Administered 2022-02-23: 3 [IU] via SUBCUTANEOUS
  Administered 2022-02-23: 4 [IU] via SUBCUTANEOUS

## 2022-02-22 MED ORDER — INSULIN ASPART 100 UNIT/ML IJ SOLN
2.0000 [IU] | INTRAMUSCULAR | Status: DC
  Administered 2022-02-22 – 2022-02-23 (×7): 2 [IU] via SUBCUTANEOUS

## 2022-02-22 MED ORDER — FUROSEMIDE 10 MG/ML IJ SOLN
80.0000 mg | Freq: Once | INTRAMUSCULAR | Status: AC
  Administered 2022-02-22: 80 mg via INTRAVENOUS
  Filled 2022-02-22: qty 8

## 2022-02-22 NOTE — Progress Notes (Signed)
Initial Nutrition Assessment  DOCUMENTATION CODES:    (High Nutrition Risk)  INTERVENTION:   Tube Feeding via Cortrak:  Nepro at 50 ml/hr Pro-Source TF 45 mL BID This provides 119 g of protein, 2240 kcals and 876 ml of free water   NUTRITION DIAGNOSIS:   Inadequate oral intake related to lethargy/confusion, inability to eat as evidenced by NPO status.  GOAL:   Patient will meet greater than or equal to 90% of their needs  MONITOR:   Diet advancement, Labs, Weight trends, PO intake  REASON FOR ASSESSMENT:   Consult Enteral/tube feeding initiation and management  ASSESSMENT:   55 yo male admitted with acute encephalopathy with acute respiratory failure and shock with AKI on CKD 3, decompensated cirrhosis. PMH EtOH abuse, cirrhosis, GERD, HTN, DM, seizures, hx cardiac arrest, CKD  NPO. Pt is lethargic but arousable, opens eyes but does not converse much. Unable to get diet and weight history from patient at this time.   NG tube placed yesterday, replaced with Cortrak today. Adult TF protocol initiated yesterday  Dry weight unknown given significant edema on exam. Current wt 123.9   Renal failure worsening, no plans for RRT  Liquid stool via rectal tube but also on lactulose. Cdiff antigen positive, toxin negative  Pt with muscle wasting on exam but good fat stores. Pt with severe edema. Pt does not meet clinical characteristics for malnutrition at this time with limited information. Pt is high nutrition risk and will continue to monitor.   Labs: Creatinine 3.89, BUN 56, phosphorus 6.6 (H), albumin <1.5, corrected calcium >/= 9.1 (wdl), ionized calcium 1.09 (L), magesium wdl, potassium wdl Meds: lactulose, ss novolog, novolog q 4 hours, folic acid, thiamine   NUTRITION - FOCUSED PHYSICAL EXAM:  Flowsheet Row Most Recent Value  Orbital Region No depletion  Upper Arm Region No depletion  Thoracic and Lumbar Region No depletion  Buccal Region No depletion  Temple  Region Mild depletion  Clavicle Bone Region Moderate depletion  Clavicle and Acromion Bone Region Mild depletion  Scapular Bone Region Mild depletion  Dorsal Hand Unable to assess  Patellar Region Unable to assess  [severe edema]  Anterior Thigh Region Unable to assess  [severe edema]  Posterior Calf Region Unable to assess  [severe edema]  Edema (RD Assessment) Severe  Hair Reviewed  Eyes Reviewed  Skin Reviewed  Nails Unable to assess       Diet Order:   Diet Order             Diet NPO time specified  Diet effective now                   EDUCATION NEEDS:   Not appropriate for education at this time  Skin:  Skin Assessment: Skin Integrity Issues: Skin Integrity Issues:: Stage I, Diabetic Ulcer Stage I: anus Diabetic Ulcer: multiple diabetic toe ulcers  Last BM:  5/18  Height:   Ht Readings from Last 1 Encounters:  02/02/22 '5\' 10"'$  (1.778 m)    Weight:   Wt Readings from Last 1 Encounters:  02/22/22 123.9 kg    BMI:  Body mass index is 39.19 kg/m.  Estimated Nutritional Needs:   Kcal:  2200-2400 kcals  Protein:  110-130 g  Fluid:  1.8 L    Kerman Passey MS, RDN, LDN, CNSC Registered Dietitian III Clinical Nutrition RD Pager and On-Call Pager Number Located in [place]

## 2022-02-22 NOTE — Progress Notes (Signed)
Inpatient Diabetes Program Recommendations  AACE/ADA: New Consensus Statement on Inpatient Glycemic Control (2015)  Target Ranges:  Prepandial:   less than 140 mg/dL      Peak postprandial:   less than 180 mg/dL (1-2 hours)      Critically ill patients:  140 - 180 mg/dL   Lab Results  Component Value Date   GLUCAP 224 (H) 02/22/2022   HGBA1C 8.3 (H) 02/02/2022    Review of Glycemic Control  Latest Reference Range & Units 02/22/22 08:03 02/22/22 11:45  Glucose-Capillary 70 - 99 mg/dL 200 (H) 224 (H)  (H): Data is abnormally high  Inpatient Diabetes Recommendations:  Might consider Novolog 2 units Q4H tube feed coverage.  Hold if feeds are held or discontinued.  Will continue to follow while inpatient.  Thank you, Reche Dixon, MSN, [place] [facility] Coordinator Inpatient Diabetes Program [phone] (team pager from 8a-5p)

## 2022-02-22 NOTE — Hospital Course (Addendum)
  5/17: Admitted to ICU, cardiology consulted  Jeffrey [name] is a 56 yr old male who was admitted to the hospital after being found down in his nursing home on 02/12/2022. He was found to be hypothermic on arrival with severe sinus bradycardia and hypotension. He required dopamine to maintain HR>60 and adequate Bps.  CXR: right pleural effusion, bilateral hazy opacities, cardiomegaly. CT head neg. RUQ [name]: fatty liver changes of cirrhosis. CCM consulted on admission, admitted to ICU. Cardiology consulted for bradycardia, determined he was not a candidate for pacemaker. Cardiology felt the bradycardia was not the cause for this patient's hypotension, hypothermia, and encephalopathy. Nephrology consulted for AKI on CKD IIIa, believes due to prolonged ischemic ATN. With UOP declined and poor functional status and other co-morbidities they felt that CRRT would not be beneficial. Pt was made DNR/DNI after Jeffrey [name] discussion with his sister Jeffrey [name]. Pt made comfort care on 5/20. Transferred to [facility] service on 03/15/2023 at 0700 and died at 19. Family notified.

## 2022-02-22 NOTE — Progress Notes (Signed)
Upon initial assessment NG securement device sitting on patients chest and not attached to nose. Tube feeding stopped at the time. External length of NG tube measured at 65 cm. Previous documentation of NG external length at 43 cm. NG tube advanced to 43 cm and KUB ordered being placed. Will notify on call MD.

## 2022-02-22 NOTE — Progress Notes (Signed)
NAME:  Jeffrey [name], MRN:  [id number], DOB:  [birth year], LOS: 2 ADMISSION DATE:  02/16/2022, CONSULTATION DATE:  02/10/2022  REFERRING MD:  Dr Ashok Cordia ER, CHIEF COMPLAINT:  Circulatory shock, bradycardia, hypothermia   History of Present Illness:  56 year old male known to have type 2 diabetes, chronic venous stasis edema history of cardiac arrest with "anoxic brain injury" erosive esophagitis, anemia of chronic disease, seizure disorder, essential hypertension, cirrhosis secondary to alcohol and obesity. He has multiple recent admissions for upper GIB with gastric ulcers and varices, sepsis from UTI vs SBP, adrenal insufficiency, clostridium difficile, and seizure. Most recently dc to SNF. See Consult note for full description of admissions.    Readmitted on 02/10/2022 via the ER after being found down.  Found to be hypothermic on arrival along with severe sinus bradycardia and hypotension.  Responded to fluids with improvement in mental status.  Requiring dopamine to maintain a heart rate of 60 and systolic blood pressure 329.  Unclear downtime in the nursing home.  But pattern seems to be consistent with prior admissions.  CCM called for admission.  Protecting airway but not on the ventilator 02/27/2022.  Cardiologist suspects medical cause of bradycardia.  Pacemaker not indicated.  ER RN - says he smells like "c diff"  Past Medical History:   has a past medical history of Acute renal insufficiency (12/08/2012), AKI (acute kidney injury) ([place]), ALCOHOL ABUSE, HX OF (11/10/2007), Anoxic encephalopathy ([place]), Anxiety, Bleeding ulcer (2014), Blood transfusion (2014), Cardiac arrest ([place]) (12/07/2012), GERD (gastroesophageal reflux disease), High cholesterol, Hypertension, OSA on CPAP (12/07/2012), Seizures ([place]), Type II diabetes mellitus ([place]), and Venous insufficiency (12/13/2011).   Significant Hospital Events:  5/17 admit found down at SNF. Hypotensive, bradycardic in ED. Started on  BiPAP/dopamine.   Interim History / Subjective:  Remains off BIPAP Transitioned from dopa to norepi. Still on, but weaning down MAP 80.  Mental status improving. Renal function somewhat worse, oliguric  Objective   Blood pressure 111/64, pulse 73, temperature 97.9 F (36.6 C), resp. rate (!) 27, weight 123.9 kg, SpO2 100 %.        Intake/Output Summary (Last 24 hours) at 02/22/2022 1026 Last data filed at 02/22/2022 0700 Gross per 24 hour  Intake 1649.52 ml  Output 450 ml  Net 1199.52 ml   Filed Weights   02/21/22 0331 02/22/22 0500  Weight: 123.5 kg 123.9 kg    Examination:  General:  obese middle aged male HENT: [place]/AT, PERRL, no JVD. NGT in place.  Lungs: Clear bilateral breath sounds.  Cardiovascular: RRR, no MRG. 3+ bilateral lower extremity pitting edema.  Abdomen: Protuberant, soft, non-tender.  Extremities: edema as above.  Neuro: Awake, alert. Oriented to self only. Answers yes no questions, but not necessarily accurately.   Imaging reviewed: RUQ [name] > Fatty liver with probable cirrhotic changes, no gallstone, small ascites and right sided pleural effusion. Patent main portal vein with hepatopetal flow.  CT head > mild cortical atrophy. Non-acute.  Venous doppler LE 5/18 > No evidence of deep vein thrombosis seen in the lower extremities,  bilaterally.   Labs reviewed:  Creatinine 3.55 > 3.89, BUN 56, AST 93 > 44, ALT 57 > 44, albumin < 1.5, ammonia 73 > 37 Hemoglobin 9.9 > 8, hct 24.9, wbc 14.8  Blood culture pending Urine cx pending C-diff pcr antigen pos, toxin neg, toxigenic pcr positive. COVID flu neg  Resolved Hospital Problem list     Assessment & Plan:    Shock: etiology unclear. Sepsis certainly  in the differential. Procalcitonin undetectably low, however. No clear source. Hypothermia was certainly playing a role as well. Known history of adrenal insufficiency. Probably an element of chronic hypotesion as well, midodrine on home medication  profile.  Bradycardia: evaluated by cardiology who feels there is a metabolic cause. - ICU for hemodynamic monitoring - Levophed for MAP 65 - restart midodrine per tube (home med PRN, will schedule) - holding home inderal, norvasc, demadex.  - Stress dose steroids in place of home hydrocortisone. Solucortef 100 q 6 started 5/18 - Ceftriaxone to cover SBP, UTI. Ascites not significant enough to tap. - Toxigenic PCR C. Diff positive, antigen pos,  toxin negative . No symptoms. Hold tx for now.  Acute metabolic encephalopathy: known history of cardiac arrest with anoxic injury. Physical exam on recent admission lists alert and oriented x 3. Currently awake but not responding or following commands. Ddx includes hepatic encephalopathy, sz, or encephalopathy 2/2 shock. EEG negative - Close monitoring of airway in ICU - Lactulose continue - Consider MRI brain once more stable.   Acute mixed respiratory failure: secondary to encephalopathy and possibly pulmonary edema secondary to IVF resuscitation. Probably a degree of undiagnosed OSA with decompensation as well.  - Consider nocturnal NIV, currently doing OK without.  - SpO2 goal 90-95% - Doing much better from this perspective. Now off supplemental O2, but remains an intubation risk due to airway concerns.   QTC prolongation: 628 on admission in the setting of profound bradycardia. Now 512 - avoid QT prolonging medications.  - Repeat EKG in AM  Decompensated cirrhosis: alcoholic vs fatty liver disease. Small ascites on RUQ [name]. INR wnl. T bili 0.4. Ammonia 73.- Seen by [facility] multiple times in the recent past History of GI bleed. No evidence this admission. - Trend LFT - Will place cortrak for lactulose administration - Steroids ongoing  Acute renal failure on CKD IIIa: likely in the setting of shock. Creatinine slowly creeping up over the last few admissions.  - s/p volume resuscitation - torsemide on hold - trend chemistry for now -  consider nephrology consult if not getting better.  - Consider foley  Protein calorie malnutrition: severe, diffuse anasarca - Cortrak, TF  Mild hyperthyroid with elevated TSH. T4 1.6 TSH 16. - T4 normalized - TSH improved  - No need to follow further  DM - CBG monitoring and SSI  Best practice (daily eval):  Diet: NPO Pain/Anxiety/Delirium protocol (if indicated): none VAP protocol (if indicated): none DVT prophylaxis: Heparin GI prophylaxis: ppi Glucose control: ssi Mobility: bed rest Code Status: full code   [place] care time 52 minutes  Georgann Housekeeper, AGACNP-BC [place] for personal pager PCCM on call pager [phone] until 7pm. Please call Elink 7p-7a. [phone]  02/22/2022 10:26 AM

## 2022-02-22 NOTE — Progress Notes (Addendum)
[facility] NEPHROLOGY PROGRESS NOTE  Assessment/ Plan: Pt is a 56 y.o. yo male with past medical history of alcohol abuse, alcoholic liver cirrhosis, ascites, cardiac arrest, anoxic brain injury, anemia, recent multiple frequent hospital admission was readmitted on 5/17 after being found down.  In ER the patient was found to have sinus bradycardia and hypotension.  Responded with dopamine and fluid resuscitation.  The patient was admitted to ICU.  The nephrology team saw the patient during recent hospitalization on 5/8 for AKI on CKD 3A thought to be due to ATN.  The patient has nephrotic range proteinuria with extensive serologies including ANA, ANCA, C3, C4, kappa lambda ratio unremarkable.  The kidney ultrasound with chronicity without hydronephrosis.  Patient remains hypotensive.  After extensive discussion with the family he is DNR and DNI.  The lab with worsening creatinine level of 3.89 and elevated phosphorus level of 6.6.  #Acute kidney injury on CKD IIIa: Prolonged ischemic ATN with multiple recurrent insult to the kidney due to shock and liver disease ? HRS.  The urine output is declining with worsening renal parameters.  Given multiple comorbidities as discussed above and very poor functional status, I agree that the patient is not a candidate for dialysis.  I do not think CRRT will help long-term outcome.  He is DNR/DNI.  I agree with hospice and comfort care if no further improvement in kidney function. Given persistent edema, I will order a dose of IV Lasix with albumin.  #Hyperphosphatemia in the setting of AKI: He is n.p.o. therefore I will order Phoslyra from the tube feed.  Recommend Nepro or low phosphorus at diet.    #Shock and bradycardia: Uncertain etiology.  Echo with mild LVH and LVEF acceptable.  Tapering Levophed and I will increase midodrine dose.  Also on stress dose steroid.  #Decompensated liver cirrhosis, hyperammonemia: On lactulose, per primary  team.  #Acute metabolic encephalopathy.  Discussed with ICU team.  Please call us with question.  Subjective: Seen and examined ICU.  Currently off of Levophed with systolic blood pressure in 90s.  He is alert awake and oriented to his name only.  No urine output today. Objective Vital signs in last 24 hours: Vitals:   02/22/22 1115 02/22/22 1150 02/22/22 1200 02/22/22 1300  BP: 111/66  115/70 106/66  Pulse: 76 76 75 69  Resp: (!) 23 (!) 21 (!) 22 (!) 24  Temp:  97.9 F (36.6 C)    TempSrc:  Oral    SpO2: 96% 97% 97% 96%  Weight:       Weight change: 0.4 kg  Intake/Output Summary (Last 24 hours) at 02/22/2022 1507 Last data filed at 02/22/2022 1300 Gross per 24 hour  Intake 2008.22 ml  Output 150 ml  Net 1858.22 ml       Labs: RENAL PANEL Recent Labs    07/30/21 0100 07/30/21 0626 12/25/21 2876 12/26/21 0428 12/27/21 0729 12/28/21 8115 12/29/21 0319 01/01/22 1520 01/08/22 2000 01/09/22 0508 01/10/22 0317 01/11/22 0253 01/12/22 1342 01/14/22 0845 01/14/22 1247 01/15/22 0531 01/15/22 1452 01/18/22 1600 01/23/22 1750 01/23/22 1813 02/02/22 0515 02/03/22 0421 02/04/22 0316 02/05/22 0145 02/06/22 0228 02/07/22 0117 02/08/22 0228 02/09/22 0240 02/10/22 0136 02/11/22 0223 03/03/2022 1900 02/19/2022 1955 03/03/2022 2125 02/21/22 0455 02/21/22 0641 02/21/22 0951 02/21/22 1624 02/22/22 0611  NA 126*   < > 134* 137   < > 136  --  135   < > 140 140 138 138 135   < > 138   < >  137 134*   < > 129* 131* 131* 134* 133* '135 136 136 136 136 138 140 139 139 136 '$ 138  --  139  K 2.3*   < > 4.6 4.4   < > 4.9  --  5.5*   < > 4.5 4.6 4.6 4.9 5.3*   < > 4.7   < > 5.8* 5.3*   < > 4.4 4.7 4.8 4.6 4.6 4.9 4.8 4.6 5.1 5.1 3.8 3.9 3.8 3.7 3.8 3.7  --  4.2  CL 86*   < > 100 101   < > 106  --  106   < > 109 109 109 108 106   < > 109   < > 109 105  --  '102 103 101 105 104 105 107 107 107 105 '$ 108 111  --   --   --  110  --  110  CO2 27   < > 27 28   < > 27  --  23   < > '22 23 25  24 25   '$ < > 27   < > 23 22  --  15* 20* 22 21* 19* 19* 22 21* 22 21*  --  22  --   --   --  21*  --  20*  GLUCOSE 168*   < > 189* 172*   < > 130*  --  56*   < > 85 123* 278* 167* 213*   < > 140*   < > 208* 350*  --  541* 217* 339* 264* 271* 178* 226* 127* 373* 429* 165* 166*  --   --   --  219*  --  211*  BUN 5*   < > 16 16   < > 18  --  25*   < > '18 18 20 '$ 21* 22*   < > 22*   < > 28* 19  --  28* 26* 29* 30* 34* 34* 38* 38* 43* 48* 48* 52*  --   --   --  52*  --  56*  CREATININE 1.25*   < > 1.35* 1.25*   < > 1.30*  --  1.45*   < > 1.56* 1.80* 1.74* 1.80* 1.73*   < > 1.54*   < > 1.67* 1.56*  --  3.59* 3.57* 3.58* 3.63* 3.58* 3.45* 3.50* 3.57* 3.25* 3.46* 3.60* 3.55*  --   --   --  3.59*  --  3.89*  CALCIUM 5.6*   < > 7.0* 7.0*   < > 7.3*  --  8.1*   < > 7.1* 7.6* 7.2* 7.6* 7.7*   < > 7.6*   < > 7.7* 7.6*  --  6.9* 7.2* 7.6* 7.7* 7.7* 8.1* 7.7* 7.8* 7.7* 7.7*  --  7.1*  --   --   --  7.1*  --  7.1*  MG  --    < > 1.2* 1.2*   < > 1.7  --  1.4*  --  1.2* 1.8 1.7 1.8 1.7  --   --   --   --   --   --   --   --   --   --   --   --   --   --   --  1.4*  --   --   --   --   --  1.3* 1.9 1.9  PHOS 3.4  --  2.2* 2.4*  --  2.2* 2.7  3.2  --   --   --   --   --   --   --   --   --   --   --   --   --   --   --   --   --   --   --   --   --  4.4  --   --   --   --   --  6.5* 6.3* 6.6*  ALBUMIN  --    < >  --   --   --   --   --  <1.5*   < > <1.5* <1.5* <1.5* <1.5* <1.5*  --  <1.5*  --  <1.5* <1.5*  --  <1.5* <1.5* <1.5*  --  1.9*  --   --   --   --  1.5*  --  1.5*  --   --   --   --   --  <1.5*   < > = values in this interval not displayed.     Liver Function Tests: Recent Labs  Lab 02/10/2022 1955 02/22/22 0611  AST 93* 44*  ALT 57* 44  ALKPHOS 159* 139*  BILITOT 0.4 0.5  PROT 4.9* 4.9*  ALBUMIN 1.5* <1.5*   No results for input(s): LIPASE, AMYLASE in the last 168 hours. Recent Labs  Lab 02/06/2022 2245 02/21/22 0442 02/22/22 0611  AMMONIA 29 73* 37*   CBC: Recent Labs    08/02/21 0128 08/03/21 0108  11/19/21 1545 11/19/21 1558 12/20/21 1547 12/20/21 1600 12/27/21 1222 12/27/21 1804 12/28/21 1747 12/29/21 0319 01/09/22 0508 01/10/22 0317 02/27/2022 2125 02/21/22 0442 02/21/22 0455 02/21/22 0641 02/22/22 0611  HGB 9.8*   < > 7.5*   < > 7.6*   < >  --    < >  --    < > 8.0*   < > 10.2* 9.9* 17.3* 10.2* 8.0*  MCV 89.0   < > 77*  --  73.2*   < >  --    < >  --    < > 80.3   < >  --  87.9  --   --  86.8  VITAMINB12  --   --   --   --   --   --  865  --   --   --  1,032*  --   --   --   --   --   --   FOLATE  --   --   --   --   --   --  36.6  --   --   --  30.6  --   --   --   --   --   --   FERRITIN 208  --  36  --  46  --   --   --  88  --   --   --   --   --   --   --   --   TIBC NOT CALCULATED  --   --   --  125*  --   --   --  113*  --   --   --   --   --   --   --   --   IRON 35*  --   --   --  85  --   --   --  26*  --   --   --   --   --   --   --   --  RETICCTPCT  --   --   --   --   --   --  1.0  --   --   --   --   --   --   --   --   --   --    < > = values in this interval not displayed.    Cardiac Enzymes: Recent Labs  Lab 03/03/2022 1955  CKTOTAL 214   CBG: Recent Labs  Lab 02/21/22 2005 02/22/22 0018 02/22/22 0510 02/22/22 0803 02/22/22 1145  GLUCAP 149* 173* 174* 200* 224*    Iron Studies: No results for input(s): IRON, TIBC, TRANSFERRIN, FERRITIN in the last 72 hours. Studies/Results: DG Abd 1 View  Result Date: 02/22/2022 CLINICAL DATA:  Confirm NGT positioning. EXAM: ABDOMEN - 1 VIEW COMPARISON:  Similar study yesterday at 12:55 p.m. FINDINGS: AP view of the abdomen excluding the pelvis was performed at 2:14 a.m., 02/22/2022. The tip of the NGT remains in the distal gastric antrum. There are scattered mildly dilated small bowel segments up to 3.1 cm. Exam technically limited by body habitus. There is no obvious supine evidence of free air. Coarse calcifications are again noted in the pancreatic tail. IMPRESSION: NGT tip remains in the distal gastric  antrum. Chronic calcific pancreatitis. Electronically Signed   By: Telford Nab M.D.   On: 02/22/2022 02:32   DG Abd 1 View  Result Date: 02/21/2022 CLINICAL DATA:  Nasogastric tube placement. EXAM: ABDOMEN - 1 VIEW COMPARISON:  Abdominal radiographs dated 02/02/2022. FINDINGS: An enteric tube terminates in the stomach near the pylorus. IMPRESSION: Enteric tube terminates in the stomach. Electronically Signed   By: Zerita Boers M.D.   On: 02/21/2022 13:12   CT Head Wo Contrast  Result Date: 02/04/2022 CLINICAL DATA:  Found down, hypotension EXAM: CT HEAD WITHOUT CONTRAST TECHNIQUE: Contiguous axial images were obtained from the base of the skull through the vertex without intravenous contrast. RADIATION DOSE REDUCTION: This exam was performed according to the departmental dose-optimization program which includes automated exposure control, adjustment of the mA and/or kV according to patient size and/or use of iterative reconstruction technique. COMPARISON:  MRI brain dated 01/24/2022 FINDINGS: Brain: No evidence of acute infarction, hemorrhage, hydrocephalus, extra-axial collection or mass lesion/mass effect. Mild cortical atrophy. Vascular: No hyperdense vessel or unexpected calcification. Skull: Normal. Negative for fracture or focal lesion. Sinuses/Orbits: The visualized paranasal sinuses are essentially clear. The mastoid air cells are unopacified. Other: None. IMPRESSION: No evidence of acute intracranial abnormality. Mild cortical atrophy. Electronically Signed   By: Julian Hy M.D.   On: 02/11/2022 20:23   DG Chest Port 1 View  Result Date: 02/21/2022 CLINICAL DATA:  Sepsis EXAM: PORTABLE CHEST 1 VIEW COMPARISON:  02/05/2022 FINDINGS: Resuscitation pad over the central chest. Stable enlarged cardiac silhouette. Bilateral pleural effusions. Low lung volumes. Some improvement in vascular congestion the upper lobes. IMPRESSION: 1. Low lung volumes bilateral pleural effusions. 2. Some  improvement in vascular congestion the upper lobes. Electronically Signed   By: Suzy Bouchard M.D.   On: 02/21/2022 08:10   DG Chest Port 1 View  Result Date: 03/04/2022 CLINICAL DATA:  altered mental status EXAM: PORTABLE CHEST 1 VIEW COMPARISON:  Radiograph dated February 02, 2022 FINDINGS: Evaluation is limited secondary to multiple overlapping structures. Enlarged cardiomediastinal silhouette. At least moderate RIGHT pleural effusion. Hazy opacities bilaterally, increased since prior. No large pneumothorax. Degenerative changes of the thoracic spine. IMPRESSION: 1. At least moderate RIGHT pleural effusion. 2. Hazy opacities bilaterally with  differential considerations including edema or multifocal infection. 3. Cardiomegaly. Electronically Signed   By: Valentino Saxon M.D.   On: 02/19/2022 20:01   DG Abd Portable 1V  Result Date: 02/22/2022 CLINICAL DATA:  Feeding tube placement. EXAM: PORTABLE ABDOMEN - 1 VIEW COMPARISON:  02/22/2022 at 2:14 a.m. FINDINGS: Interval removal of nasogastric tube and placement of enteric feeding tube which has tip over the region of the distal stomach in the right upper quadrant. Bowel gas pattern is nonobstructive. Remainder of the exam is unchanged. IMPRESSION: 1. Nonobstructive bowel gas pattern. 2. Enteric feeding tube with tip over the distal stomach in the right upper quadrant. Electronically Signed   By: Marin Olp M.D.   On: 02/22/2022 11:17   EEG adult  Result Date: 02/21/2022 Lora Havens, MD     02/21/2022  1:27 PM Patient Name: Jeffrey [name] MRN: [id number] Epilepsy Attending: Lora Havens Referring Physician/Provider: Corey Harold, NP Date: 02/21/2022 Duration: 23.35 mins Patient history: 55 year old male with seizures who presented with altered mental status.  EEG to evaluate for seizure. Level of alertness: lethargic AEDs during EEG study: LEV Technical aspects: This EEG study was done with scalp electrodes positioned according to the  10-20 International system of electrode placement. Electrical activity was acquired at a sampling rate of '500Hz'$  and reviewed with a high frequency filter of '70Hz'$  and a low frequency filter of '1Hz'$ . EEG data were recorded continuously and digitally stored. Description: EEG showed continuous generalized 3 to 6 Hz theta-delta slowing. Hyperventilation and photic stimulation were not performed.   ABNORMALITY - Continuous slow, generalized IMPRESSION: This study is suggestive of moderate to severe diffuse encephalopathy, nonspecific etiology. No seizures or epileptiform discharges were seen throughout the recording. Lora Havens   ECHOCARDIOGRAM COMPLETE  Result Date: 02/21/2022    ECHOCARDIOGRAM REPORT   Patient Name:   Jeffrey [name] [facility] Date of Exam: 02/21/2022 Medical Rec #:  [id number]        Height:       70.0 in Accession #:    [id number]       Weight:       272.3 lb Date of Birth:  [birth year]        BSA:          2.380 m Patient Age:    77 years         BP:           106/61 mmHg Patient Gender: M                HR:           76 bpm. Exam Location:  Inpatient Procedure: 2D Echo, Cardiac Doppler, Color Doppler and Intracardiac            Opacification Agent Indications:    Other heart sounds  History:        Patient has prior history of Echocardiogram examinations, most                 recent 01/11/2022. Risk Factors:Diabetes, Dyslipidemia,                 Hypertension and Sleep Apnea. CKD.  Sonographer:    Clayton Lefort RDCS (AE) Referring Phys: [id number] [facility]  Sonographer Comments: On BiPAP. IMPRESSIONS  1. Left ventricular ejection fraction, by estimation, is 55 to 60%. The left ventricle has normal function. The left ventricle has no regional wall motion abnormalities. There is mild left ventricular hypertrophy. Left ventricular diastolic parameters  were normal.  2. Right ventricular systolic function is normal. The right ventricular size is normal.  3. The pericardial effusion is lateral to the left  ventricle.  4. The mitral valve is normal in structure. Trivial mitral valve regurgitation. No evidence of mitral stenosis.  5. The aortic valve is tricuspid. There is mild calcification of the aortic valve. Aortic valve regurgitation is not visualized. Aortic valve sclerosis is present, with no evidence of aortic valve stenosis.  6. The inferior vena cava is dilated in size with >50% respiratory variability, suggesting right atrial pressure of 8 mmHg. FINDINGS  Left Ventricle: Left ventricular ejection fraction, by estimation, is 55 to 60%. The left ventricle has normal function. The left ventricle has no regional wall motion abnormalities. Definity contrast agent was given IV to delineate the left ventricular  endocardial borders. The left ventricular internal cavity size was normal in size. There is mild left ventricular hypertrophy. Left ventricular diastolic parameters were normal. Right Ventricle: The right ventricular size is normal. No increase in right ventricular wall thickness. Right ventricular systolic function is normal. Left Atrium: Left atrial size was normal in size. Right Atrium: Right atrial size was normal in size. Pericardium: Trivial pericardial effusion is present. The pericardial effusion is lateral to the left ventricle. Mitral Valve: The mitral valve is normal in structure. There is mild thickening of the mitral valve leaflet(s). Trivial mitral valve regurgitation. No evidence of mitral valve stenosis. Tricuspid Valve: The tricuspid valve is normal in structure. Tricuspid valve regurgitation is mild . No evidence of tricuspid stenosis. Aortic Valve: The aortic valve is tricuspid. There is mild calcification of the aortic valve. Aortic valve regurgitation is not visualized. Aortic valve sclerosis is present, with no evidence of aortic valve stenosis. Aortic valve mean gradient measures 2.0 mmHg. Aortic valve peak gradient measures 3.7 mmHg. Aortic valve area, by VTI measures 4.35 cm.  Pulmonic Valve: The pulmonic valve was normal in structure. Pulmonic valve regurgitation is not visualized. No evidence of pulmonic stenosis. Aorta: The aortic root is normal in size and structure. Venous: The inferior vena cava is dilated in size with greater than 50% respiratory variability, suggesting right atrial pressure of 8 mmHg. IAS/Shunts: No atrial level shunt detected by color flow Doppler.  LEFT VENTRICLE PLAX 2D LVIDd:         4.30 cm   Diastology LVIDs:         3.10 cm   LV e' medial:    6.68 cm/s LV PW:         1.50 cm   LV E/e' medial:  10.6 LV IVS:        1.30 cm   LV e' lateral:   7.05 cm/s LVOT diam:     2.40 cm   LV E/e' lateral: 10.0 LV SV:         90 LV SV Index:   38 LVOT Area:     4.52 cm  RIGHT VENTRICLE             IVC RV Basal diam:  3.10 cm     IVC diam: 2.40 cm RV S prime:     11.60 cm/s TAPSE (M-mode): 2.0 cm LEFT ATRIUM             Index        RIGHT ATRIUM           Index LA diam:        3.60 cm 1.51 cm/m   RA Area:  13.20 cm LA Vol (A2C):   64.1 ml 26.94 ml/m  RA Volume:   29.20 ml  12.27 ml/m LA Vol (A4C):   46.4 ml 19.50 ml/m LA Biplane Vol: 58.4 ml 24.54 ml/m  AORTIC VALVE AV Area (Vmax):    4.14 cm AV Area (Vmean):   4.00 cm AV Area (VTI):     4.35 cm AV Vmax:           96.40 cm/s AV Vmean:          64.500 cm/s AV VTI:            0.206 m AV Peak Grad:      3.7 mmHg AV Mean Grad:      2.0 mmHg LVOT Vmax:         88.30 cm/s LVOT Vmean:        57.000 cm/s LVOT VTI:          0.198 m LVOT/AV VTI ratio: 0.96  AORTA Ao Root diam: 3.10 cm Ao Asc diam:  3.30 cm MITRAL VALVE               TRICUSPID VALVE MV Area (PHT): 2.45 cm    TR Peak grad:   29.2 mmHg MV Decel Time: 310 msec    TR Vmax:        270.00 cm/s MV E velocity: 70.70 cm/s MV A velocity: 62.60 cm/s  SHUNTS MV E/A ratio:  1.13        Systemic VTI:  0.20 m                            Systemic Diam: 2.40 cm Jenkins Rouge MD Electronically signed by Jenkins Rouge MD Signature Date/Time: 02/21/2022/11:21:33 AM    Final     VAS [name] LOWER EXTREMITY VENOUS (DVT)  Result Date: 02/21/2022  Lower Venous DVT Study Patient Name:  Jeffrey [name] [facility]  Date of Exam:   02/21/2022 Medical Rec #: [id number]         Accession #:    [id number] Date of Birth: 1966/08/27         Patient Gender: M Patient Age:   62 years Exam Location:  [facility] Procedure:      VAS [name] LOWER EXTREMITY VENOUS (DVT) Referring Phys: MURALI RAMASWAMY --------------------------------------------------------------------------------  Indications: Swelling.  Limitations: Body habitus and pitting edema. Comparison Study: No prior study on file Performing Technologist: Sharion Dove RVS  Examination Guidelines: A complete evaluation includes B-mode imaging, spectral Doppler, color Doppler, and power Doppler as needed of all accessible portions of each vessel. Bilateral testing is considered an integral part of a complete examination. Limited examinations for reoccurring indications may be performed as noted. The reflux portion of the exam is performed with the patient in reverse Trendelenburg.  +---------+---------------+---------+-----------+----------+-------------------+ RIGHT    CompressibilityPhasicitySpontaneityPropertiesThrombus Aging      +---------+---------------+---------+-----------+----------+-------------------+ CFV      Full           Yes      Yes                                      +---------+---------------+---------+-----------+----------+-------------------+ SFJ      Full                                                             +---------+---------------+---------+-----------+----------+-------------------+  FV Prox  Full                                                             +---------+---------------+---------+-----------+----------+-------------------+ FV Mid   Full                                                             +---------+---------------+---------+-----------+----------+-------------------+  FV DistalFull                                                             +---------+---------------+---------+-----------+----------+-------------------+ PFV                     Yes      Yes                  patent by color and                                                       Doppler             +---------+---------------+---------+-----------+----------+-------------------+ POP      Full           Yes      Yes                                      +---------+---------------+---------+-----------+----------+-------------------+ PTV      Full                                                             +---------+---------------+---------+-----------+----------+-------------------+ PERO     Full                                                             +---------+---------------+---------+-----------+----------+-------------------+   +---------+---------------+---------+-----------+----------+--------------+ LEFT     CompressibilityPhasicitySpontaneityPropertiesThrombus Aging +---------+---------------+---------+-----------+----------+--------------+ CFV      Full           Yes      Yes                                 +---------+---------------+---------+-----------+----------+--------------+ SFJ      Full                                                        +---------+---------------+---------+-----------+----------+--------------+  FV Prox  Full                                                        +---------+---------------+---------+-----------+----------+--------------+ FV Mid   Full                                                        +---------+---------------+---------+-----------+----------+--------------+ FV DistalFull                                                        +---------+---------------+---------+-----------+----------+--------------+ PFV      Full                                                         +---------+---------------+---------+-----------+----------+--------------+ POP      Full           Yes      Yes                                 +---------+---------------+---------+-----------+----------+--------------+ PTV      Full                                                        +---------+---------------+---------+-----------+----------+--------------+ PERO     Full                                                        +---------+---------------+---------+-----------+----------+--------------+    Summary: BILATERAL: - No evidence of deep vein thrombosis seen in the lower extremities, bilaterally. -No evidence of popliteal cyst, bilaterally.   *See table(s) above for measurements and observations.    Preliminary    US Abdomen Limited RUQ (LIVER/GB)  Result Date: 02/21/2022 CLINICAL DATA:  Cirrhosis. EXAM: ULTRASOUND ABDOMEN LIMITED RIGHT UPPER QUADRANT COMPARISON:  None available. FINDINGS: Gallbladder: No gallstone. Diffusely thickened and edematous gallbladder wall measuring approximately 9 mm, likely related to underlying liver disease. Negative sonographic Murphy's sign. Common bile duct: Diameter: 3 mm Liver: There is diffuse increased liver echogenicity most commonly seen in the setting of fatty infiltration. Superimposed inflammation or fibrosis is not excluded. Clinical correlation is recommended. Portal vein is patent on color Doppler imaging with normal direction of blood flow towards the liver. Other: Small ascites and right pleural effusion. IMPRESSION: 1. Fatty liver with probable changes of cirrhosis. 2. No gallstone. Diffuse gallbladder wall thickening, likely related to underlying liver disease. 3. Small ascites and right pleural effusion. 4.  Patent main portal vein with hepatopetal flow. Electronically Signed   By: Anner Crete M.D.   On: 02/21/2022 01:35    Medications: Infusions:  sodium chloride Stopped (02/22/22 1251)   [START ON 02/23/2022]  cefTRIAXone (ROCEPHIN)  IV     dextrose 5 % and 0.45% NaCl Stopped (02/21/22 1216)   feeding supplement (NEPRO CARB STEADY)     levETIRAcetam Stopped (02/22/22 0940)   norepinephrine (LEVOPHED) Adult infusion 2 mcg/min (02/22/22 1300)    Scheduled Medications:  Chlorhexidine Gluconate Cloth  6 each Topical Daily   feeding supplement (PROSource TF)  45 mL Per Tube BID   folic acid  1 mg Intravenous Daily   heparin  5,000 Units Subcutaneous Q8H   hydrocortisone sod succinate (SOLU-CORTEF) inj  100 mg Intravenous Q6H   insulin aspart  0-20 Units Subcutaneous Q4H   insulin aspart  2 Units Subcutaneous Q4H   lactulose  30 g Per Tube TID   midodrine  5 mg Per Tube TID WC   pantoprazole (PROTONIX) IV  40 mg Intravenous Q12H   thiamine injection  100 mg Intravenous Daily    have reviewed scheduled and prn medications.  Physical Exam: General: Ill-looking male lying on bed Heart:RRR, s1s2 nl Lungs: Distant breath sound, no increased work of breathing Abdomen:soft, Non-tender, non-distended Extremities: Bilateral leg pitting edema present Neurology: Alert awake and oriented to himself only.  Sharan Mcenaney Tanna Furry 02/22/2022,3:07 PM  LOS: 2 days

## 2022-02-22 NOTE — Procedures (Signed)
Cortrak  Person Inserting Tube:  Ranell Patrick D, RD Tube Type:  Cortrak - 43 inches Tube Size:  10 Tube Location:  Left nare Secured by: Bridle Technique Used to Measure Tube Placement:  Marking at nare/corner of mouth Cortrak Secured At:  66 cm  Cortrak Tube Team Note:  Consult received to place a Cortrak feeding tube.   X-ray is required, abdominal x-ray has been ordered by the Cortrak team. Please confirm tube placement before using the Cortrak tube.   If the tube becomes dislodged please keep the tube and contact the Cortrak team at [email or web address] ([email or web address]) for replacement.  If after hours and replacement cannot be delayed, place a NG tube and confirm placement with an abdominal x-ray.    Ranell Patrick, RD, LDN Clinical Dietitian RD pager # available in [place]  After hours/weekend pager # available in [facility]

## 2022-02-22 NOTE — Progress Notes (Signed)
[place] Progress Note Patient Name: Jeffrey [name] DOB: 1966/06/04 MRN: [id number]   Date of Service  02/22/2022  HPI/Events of Note  Nursing request to review abdominal film for NGT placement. NGT tip position is satisfactory in distal stomach.   eICU Interventions  OK to use NGT.      Intervention Category Major Interventions: Other:  Lysle Dingwall 02/22/2022, 3:34 AM

## 2022-02-22 NOTE — Progress Notes (Signed)
[place] Progress Note Patient Name: Jeffrey [name] DOB: 01/18/1966 MRN: [id number]   Date of Service  02/22/2022  HPI/Events of Note  Frequent loose/watery stools d/t lactulose. Nursing reports skin breakdown on sacrum.  eICU Interventions  Plan: Place Flexiseal.     Intervention Category Major Interventions: Other:  Lysle Dingwall 02/22/2022, 1:35 AM

## 2022-02-23 ENCOUNTER — Inpatient Hospital Stay (HOSPITAL_COMMUNITY): Payer: Commercial Managed Care - HMO

## 2022-02-23 DIAGNOSIS — R579 Shock, unspecified: Secondary | ICD-10-CM | POA: Diagnosis not present

## 2022-02-23 LAB — COMPREHENSIVE METABOLIC PANEL
ALT: 36 U/L (ref 0–44)
AST: 38 U/L (ref 15–41)
Albumin: 2 g/dL — ABNORMAL LOW (ref 3.5–5.0)
Alkaline Phosphatase: 114 U/L (ref 38–126)
Anion gap: 9 (ref 5–15)
BUN: 57 mg/dL — ABNORMAL HIGH (ref 6–20)
CO2: 20 mmol/L — ABNORMAL LOW (ref 22–32)
Calcium: 7.7 mg/dL — ABNORMAL LOW (ref 8.9–10.3)
Chloride: 111 mmol/L (ref 98–111)
Creatinine, Ser: 4.17 mg/dL — ABNORMAL HIGH (ref 0.61–1.24)
GFR, Estimated: 16 mL/min — ABNORMAL LOW (ref >60)
Glucose, Bld: 174 mg/dL — ABNORMAL HIGH (ref 70–99)
Potassium: 4.1 mmol/L (ref 3.5–5.1)
Sodium: 140 mmol/L (ref 135–145)
Total Bilirubin: 0.4 mg/dL (ref 0.3–1.2)
Total Protein: 5.1 g/dL — ABNORMAL LOW (ref 6.5–8.1)

## 2022-02-23 LAB — GLUCOSE, CAPILLARY
Glucose-Capillary: 143 mg/dL — ABNORMAL HIGH (ref 70–99)
Glucose-Capillary: 169 mg/dL — ABNORMAL HIGH (ref 70–99)
Glucose-Capillary: 174 mg/dL — ABNORMAL HIGH (ref 70–99)
Glucose-Capillary: 111 mg/dL — ABNORMAL HIGH (ref 70–99)
Glucose-Capillary: 171 mg/dL — ABNORMAL HIGH (ref 70–99)

## 2022-02-23 LAB — CBC
HCT: 20.4 % — ABNORMAL LOW (ref 39.0–52.0)
Hemoglobin: 6.8 g/dL — CL (ref 13.0–17.0)
MCH: 28.6 pg (ref 26.0–34.0)
MCHC: 33.3 g/dL (ref 30.0–36.0)
MCV: 85.7 fL (ref 80.0–100.0)
Platelets: 167 10*3/uL (ref 150–400)
RBC: 2.38 MIL/uL — ABNORMAL LOW (ref 4.22–5.81)
RDW: 20.2 % — ABNORMAL HIGH (ref 11.5–15.5)
WBC: 8.4 10*3/uL (ref 4.0–10.5)
nRBC: 4.2 % — ABNORMAL HIGH (ref 0.0–0.2)

## 2022-02-23 LAB — AMMONIA: Ammonia: 35 umol/L (ref 9–35)

## 2022-02-23 LAB — PREPARE RBC (CROSSMATCH)

## 2022-02-23 MED ORDER — HYDROCORTISONE SOD SUC (PF) 100 MG IJ SOLR
100.0000 mg | Freq: Two times a day (BID) | INTRAMUSCULAR | Status: DC
  Filled 2022-02-23: qty 2

## 2022-02-23 MED ORDER — FUROSEMIDE 10 MG/ML IJ SOLN
80.0000 mg | Freq: Once | INTRAMUSCULAR | Status: AC
  Administered 2022-02-23: 80 mg via INTRAVENOUS
  Filled 2022-02-23: qty 8

## 2022-02-23 MED ORDER — HYDRALAZINE HCL 20 MG/ML IJ SOLN: 5.0000 mg | INTRAMUSCULAR | Status: DC | PRN

## 2022-02-23 MED ORDER — SODIUM CHLORIDE 0.9% IV SOLUTION: Freq: Once | INTRAVENOUS | Status: AC

## 2022-02-23 NOTE — Progress Notes (Addendum)
[name] NEPHROLOGY PROGRESS NOTE  Assessment/ Plan: Pt is a 56 y.o. yo male with past medical history of alcohol abuse, alcoholic liver cirrhosis, ascites, cardiac arrest, anoxic brain injury, anemia, recent multiple frequent hospital admission was readmitted on 5/17 after being found down.  In ER the patient was found to have sinus bradycardia and hypotension.  Responded with dopamine and fluid resuscitation.  The patient was admitted to ICU.  The nephrology team saw the patient during recent hospitalization on 5/8 for AKI on CKD 3A thought to be due to ATN.  The patient has nephrotic range proteinuria with extensive serologies including ANA, ANCA, C3, C4, kappa lambda ratio unremarkable.  The kidney ultrasound with chronicity without hydronephrosis.  #Acute kidney injury on CKD IIIa: Prolonged ischemic ATN with multiple recurrent insult to the kidney due to shock and liver disease ? HRS.  The urine output is declining with worsening renal parameters.  Given multiple comorbidities as discussed above and very poor functional status, I agree that the patient is not a candidate for dialysis.  I do not think CRRT will help long-term outcome.  He is DNR/DNI.  I agree with hospice and comfort care if no further improvement in kidney function. Treated with IV Lasix and albumin yesterday without response.  He is still has fluid overload therefore I will order another dose of Lasix. Order bladder scan  #Hyperphosphatemia in the setting of AKI: He is n.p.o. continue Phoslyra from the tube feed.  Recommend Nepro or low phosphorus at diet.    #Shock and bradycardia: Uncertain etiology.  Echo with mild LVH and LVEF acceptable.  Off of levo and we increased midodrine dose yesterday.  Also on stress dose steroid.  #Decompensated liver cirrhosis, hyperammonemia: On lactulose, per primary team.  #Severe anemia: Receiving a unit of blood transfusion.  #Acute metabolic  encephalopathy.  Discussed with ICU team.   Subjective: Seen and examined ICU.  Off of Levophed.  Blood pressure acceptable.  He is alert awake and following simple commands.  Received Lasix yesterday but no urine output.  No new event.  Objective Vital signs in last 24 hours: Vitals:   02/23/22 0600 02/23/22 0615 02/23/22 0730 02/23/22 0800  BP: 125/77 125/77  127/86  Pulse: 77 77 77 77  Resp: (!) 0 '12 20 15  '$ Temp:  (!) 97.5 F (36.4 C) 97.6 F (36.4 C) 97.8 F (36.6 C)  TempSrc:  Oral Oral Oral  SpO2: 100% 100% 100% 99%  Weight:       Weight change: 2.5 kg  Intake/Output Summary (Last 24 hours) at 02/23/2022 0922 Last data filed at 02/23/2022 0730 Gross per 24 hour  Intake 2215.9 ml  Output 1015 ml  Net 1200.9 ml        Labs: RENAL PANEL Recent Labs    12/25/21 0639 12/26/21 0428 12/27/21 0729 12/28/21 0637 12/29/21 0319 01/01/22 1520 01/08/22 2000 01/09/22 0508 01/10/22 0317 01/11/22 0253 01/12/22 1342 01/14/22 0845 01/14/22 1247 01/18/22 1600 01/23/22 1750 01/23/22 1813 02/02/22 0515 02/03/22 0421 02/04/22 0316 02/05/22 0145 02/06/22 0228 02/07/22 0117 02/08/22 0228 02/09/22 0240 02/10/22 0136 02/11/22 0223 02/27/2022 1900 02/12/2022 1955 03/04/2022 2125 02/21/22 0455 02/21/22 0641 02/21/22 0951 02/21/22 1624 02/22/22 0611 02/22/22 1751 02/23/22 0131  NA 134* 137   < > 136  --  135   < > 140 140 138 138 135   < > 137 134*   < > 129* 131* 131*   < > 133* 135 136 136 136 136 138  140 139 139 136 138  --  139  --  140  K 4.6 4.4   < > 4.9  --  5.5*   < > 4.5 4.6 4.6 4.9 5.3*   < > 5.8* 5.3*   < > 4.4 4.7 4.8   < > 4.6 4.9 4.8 4.6 5.1 5.1 3.8 3.9 3.8 3.7 3.8 3.7  --  4.2  --  4.1  CL 100 101   < > 106  --  106   < > 109 109 109 108 106   < > 109 105  --  102 103 101   < > 104 105 107 107 107 105 108 111  --   --   --  110  --  110  --  111  CO2 27 28   < > 27  --  23   < > '22 23 25 24 25   '$ < > 23 22  --  15* 20* 22   < > 19* 19* 22 21* 22 21*  --   22  --   --   --  21*  --  20*  --  20*  GLUCOSE 189* 172*   < > 130*  --  56*   < > 85 123* 278* 167* 213*   < > 208* 350*  --  541* 217* 339*   < > 271* 178* 226* 127* 373* 429* 165* 166*  --   --   --  219*  --  211*  --  174*  BUN 16 16   < > 18  --  25*   < > '18 18 20 '$ 21* 22*   < > 28* 19  --  28* 26* 29*   < > 34* 34* 38* 38* 43* 48* 48* 52*  --   --   --  52*  --  56*  --  57*  CREATININE 1.35* 1.25*   < > 1.30*  --  1.45*   < > 1.56* 1.80* 1.74* 1.80* 1.73*   < > 1.67* 1.56*  --  3.59* 3.57* 3.58*   < > 3.58* 3.45* 3.50* 3.57* 3.25* 3.46* 3.60* 3.55*  --   --   --  3.59*  --  3.89*  --  4.17*  CALCIUM 7.0* 7.0*   < > 7.3*  --  8.1*   < > 7.1* 7.6* 7.2* 7.6* 7.7*   < > 7.7* 7.6*  --  6.9* 7.2* 7.6*   < > 7.7* 8.1* 7.7* 7.8* 7.7* 7.7*  --  7.1*  --   --   --  7.1*  --  7.1*  --  7.7*  MG 1.2* 1.2*   < > 1.7  --  1.4*  --  1.2* 1.8 1.7 1.8 1.7  --   --   --   --   --   --   --   --   --   --   --   --   --  1.4*  --   --   --   --   --  1.3* 1.9 1.9 2.0  --   PHOS 2.2* 2.4*  --  2.2* 2.7 3.2  --   --   --   --   --   --   --   --   --   --   --   --   --   --   --   --   --   --   --  4.4  --   --   --   --   --  6.5* 6.3* 6.6* 6.7*  --   ALBUMIN  --   --   --   --   --  <1.5*   < > <1.5* <1.5* <1.5* <1.5* <1.5*   < > <1.5* <1.5*  --  <1.5* <1.5* <1.5*  --  1.9*  --   --   --   --  1.5*  --  1.5*  --   --   --   --   --  <1.5*  --  2.0*   < > = values in this interval not displayed.      Liver Function Tests: Recent Labs  Lab 02/06/2022 1955 02/22/22 0611 02/23/22 0131  AST 93* 44* 38  ALT 57* 44 36  ALKPHOS 159* 139* 114  BILITOT 0.4 0.5 0.4  PROT 4.9* 4.9* 5.1*  ALBUMIN 1.5* <1.5* 2.0*    No results for input(s): LIPASE, AMYLASE in the last 168 hours. Recent Labs  Lab 02/21/22 0442 02/22/22 0611 02/23/22 0131  AMMONIA 73* 37* 35    CBC: Recent Labs    08/02/21 0128 08/03/21 0108 11/19/21 1545 11/19/21 1558 12/20/21 1547 12/20/21 1600 12/27/21 1222 12/27/21 1804  12/28/21 1747 12/29/21 0319 01/09/22 0508 01/10/22 0317 02/21/22 0442 02/21/22 0455 02/21/22 0641 02/22/22 0611 02/23/22 0131  HGB 9.8*   < > 7.5*   < > 7.6*   < >  --    < >  --    < > 8.0*   < > 9.9* 17.3* 10.2* 8.0* 6.8*  MCV 89.0   < > 77*  --  73.2*   < >  --    < >  --    < > 80.3   < > 87.9  --   --  86.8 85.7  VITAMINB12  --   --   --   --   --   --  865  --   --   --  1,032*  --   --   --   --   --   --   FOLATE  --   --   --   --   --   --  36.6  --   --   --  30.6  --   --   --   --   --   --   FERRITIN 208  --  36  --  46  --   --   --  88  --   --   --   --   --   --   --   --   TIBC NOT CALCULATED  --   --   --  125*  --   --   --  113*  --   --   --   --   --   --   --   --   IRON 35*  --   --   --  85  --   --   --  26*  --   --   --   --   --   --   --   --   RETICCTPCT  --   --   --   --   --   --  1.0  --   --   --   --   --   --   --   --   --   --    < > =  values in this interval not displayed.     Cardiac Enzymes: Recent Labs  Lab 02/19/2022 1955  CKTOTAL 214    CBG: Recent Labs  Lab 02/22/22 1612 02/22/22 2017 02/23/22 0024 02/23/22 0444 02/23/22 0821  GLUCAP 168* 169* 174* 143* 111*     Iron Studies: No results for input(s): IRON, TIBC, TRANSFERRIN, FERRITIN in the last 72 hours. Studies/Results: DG Abd 1 View  Result Date: 02/22/2022 CLINICAL DATA:  Confirm NGT positioning. EXAM: ABDOMEN - 1 VIEW COMPARISON:  Similar study yesterday at 12:55 p.m. FINDINGS: AP view of the abdomen excluding the pelvis was performed at 2:14 a.m., 02/22/2022. The tip of the NGT remains in the distal gastric antrum. There are scattered mildly dilated small bowel segments up to 3.1 cm. Exam technically limited by body habitus. There is no obvious supine evidence of free air. Coarse calcifications are again noted in the pancreatic tail. IMPRESSION: NGT tip remains in the distal gastric antrum. Chronic calcific pancreatitis. Electronically Signed   By: Telford Nab M.D.    On: 02/22/2022 02:32   DG Abd 1 View  Result Date: 02/21/2022 CLINICAL DATA:  Nasogastric tube placement. EXAM: ABDOMEN - 1 VIEW COMPARISON:  Abdominal radiographs dated 02/02/2022. FINDINGS: An enteric tube terminates in the stomach near the pylorus. IMPRESSION: Enteric tube terminates in the stomach. Electronically Signed   By: Zerita Boers M.D.   On: 02/21/2022 13:12   DG Abd Portable 1V  Result Date: 02/22/2022 CLINICAL DATA:  Feeding tube placement. EXAM: PORTABLE ABDOMEN - 1 VIEW COMPARISON:  02/22/2022 at 2:14 a.m. FINDINGS: Interval removal of nasogastric tube and placement of enteric feeding tube which has tip over the region of the distal stomach in the right upper quadrant. Bowel gas pattern is nonobstructive. Remainder of the exam is unchanged. IMPRESSION: 1. Nonobstructive bowel gas pattern. 2. Enteric feeding tube with tip over the distal stomach in the right upper quadrant. Electronically Signed   By: Marin Olp M.D.   On: 02/22/2022 11:17   EEG adult  Result Date: 02/21/2022 Lora Havens, MD     02/21/2022  1:27 PM Patient Name: Jeffrey [name] MRN: [id number] Epilepsy Attending: Lora Havens Referring Physician/Provider: Corey Harold, NP Date: 02/21/2022 Duration: 23.35 mins Patient history: 56 year old male with seizures who presented with altered mental status.  EEG to evaluate for seizure. Level of alertness: lethargic AEDs during EEG study: LEV Technical aspects: This EEG study was done with scalp electrodes positioned according to the 10-20 International system of electrode placement. Electrical activity was acquired at a sampling rate of '500Hz'$  and reviewed with a high frequency filter of '70Hz'$  and a low frequency filter of '1Hz'$ . EEG data were recorded continuously and digitally stored. Description: EEG showed continuous generalized 3 to 6 Hz theta-delta slowing. Hyperventilation and photic stimulation were not performed.   ABNORMALITY - Continuous slow, generalized  IMPRESSION: This study is suggestive of moderate to severe diffuse encephalopathy, nonspecific etiology. No seizures or epileptiform discharges were seen throughout the recording. Lora Havens   ECHOCARDIOGRAM COMPLETE  Result Date: 02/21/2022    ECHOCARDIOGRAM REPORT   Patient Name:   [name] Date of Exam: 02/21/2022 Medical Rec #:  [id number]        Height:       70.0 in Accession #:    [id number]       Weight:       272.3 lb Date of Birth:  [birth year]        BSA:  2.380 m Patient Age:    32 years         BP:           106/61 mmHg Patient Gender: M                HR:           76 bpm. Exam Location:  Inpatient Procedure: 2D Echo, Cardiac Doppler, Color Doppler and Intracardiac            Opacification Agent Indications:    Other heart sounds  History:        Patient has prior history of Echocardiogram examinations, most                 recent 01/11/2022. Risk Factors:Diabetes, Dyslipidemia,                 Hypertension and Sleep Apnea. CKD.  Sonographer:    Clayton Lefort RDCS (AE) Referring Phys: [id number]  Sonographer Comments: On BiPAP. IMPRESSIONS  1. Left ventricular ejection fraction, by estimation, is 55 to 60%. The left ventricle has normal function. The left ventricle has no regional wall motion abnormalities. There is mild left ventricular hypertrophy. Left ventricular diastolic parameters were normal.  2. Right ventricular systolic function is normal. The right ventricular size is normal.  3. The pericardial effusion is lateral to the left ventricle.  4. The mitral valve is normal in structure. Trivial mitral valve regurgitation. No evidence of mitral stenosis.  5. The aortic valve is tricuspid. There is mild calcification of the aortic valve. Aortic valve regurgitation is not visualized. Aortic valve sclerosis is present, with no evidence of aortic valve stenosis.  6. The inferior vena cava is dilated in size with >50% respiratory variability, suggesting right atrial  pressure of 8 mmHg. FINDINGS  Left Ventricle: Left ventricular ejection fraction, by estimation, is 55 to 60%. The left ventricle has normal function. The left ventricle has no regional wall motion abnormalities. Definity contrast agent was given IV to delineate the left ventricular  endocardial borders. The left ventricular internal cavity size was normal in size. There is mild left ventricular hypertrophy. Left ventricular diastolic parameters were normal. Right Ventricle: The right ventricular size is normal. No increase in right ventricular wall thickness. Right ventricular systolic function is normal. Left Atrium: Left atrial size was normal in size. Right Atrium: Right atrial size was normal in size. Pericardium: Trivial pericardial effusion is present. The pericardial effusion is lateral to the left ventricle. Mitral Valve: The mitral valve is normal in structure. There is mild thickening of the mitral valve leaflet(s). Trivial mitral valve regurgitation. No evidence of mitral valve stenosis. Tricuspid Valve: The tricuspid valve is normal in structure. Tricuspid valve regurgitation is mild . No evidence of tricuspid stenosis. Aortic Valve: The aortic valve is tricuspid. There is mild calcification of the aortic valve. Aortic valve regurgitation is not visualized. Aortic valve sclerosis is present, with no evidence of aortic valve stenosis. Aortic valve mean gradient measures 2.0 mmHg. Aortic valve peak gradient measures 3.7 mmHg. Aortic valve area, by VTI measures 4.35 cm. Pulmonic Valve: The pulmonic valve was normal in structure. Pulmonic valve regurgitation is not visualized. No evidence of pulmonic stenosis. Aorta: The aortic root is normal in size and structure. Venous: The inferior vena cava is dilated in size with greater than 50% respiratory variability, suggesting right atrial pressure of 8 mmHg. IAS/Shunts: No atrial level shunt detected by color flow Doppler.  LEFT VENTRICLE PLAX 2D LVIDd:  4.30 cm   Diastology LVIDs:         3.10 cm   LV e' medial:    6.68 cm/s LV PW:         1.50 cm   LV E/e' medial:  10.6 LV IVS:        1.30 cm   LV e' lateral:   7.05 cm/s LVOT diam:     2.40 cm   LV E/e' lateral: 10.0 LV SV:         90 LV SV Index:   38 LVOT Area:     4.52 cm  RIGHT VENTRICLE             IVC RV Basal diam:  3.10 cm     IVC diam: 2.40 cm RV S prime:     11.60 cm/s TAPSE (M-mode): 2.0 cm LEFT ATRIUM             Index        RIGHT ATRIUM           Index LA diam:        3.60 cm 1.51 cm/m   RA Area:     13.20 cm LA Vol (A2C):   64.1 ml 26.94 ml/m  RA Volume:   29.20 ml  12.27 ml/m LA Vol (A4C):   46.4 ml 19.50 ml/m LA Biplane Vol: 58.4 ml 24.54 ml/m  AORTIC VALVE AV Area (Vmax):    4.14 cm AV Area (Vmean):   4.00 cm AV Area (VTI):     4.35 cm AV Vmax:           96.40 cm/s AV Vmean:          64.500 cm/s AV VTI:            0.206 m AV Peak Grad:      3.7 mmHg AV Mean Grad:      2.0 mmHg LVOT Vmax:         88.30 cm/s LVOT Vmean:        57.000 cm/s LVOT VTI:          0.198 m LVOT/AV VTI ratio: 0.96  AORTA Ao Root diam: 3.10 cm Ao Asc diam:  3.30 cm MITRAL VALVE               TRICUSPID VALVE MV Area (PHT): 2.45 cm    TR Peak grad:   29.2 mmHg MV Decel Time: 310 msec    TR Vmax:        270.00 cm/s MV E velocity: 70.70 cm/s MV A velocity: 62.60 cm/s  SHUNTS MV E/A ratio:  1.13        Systemic VTI:  0.20 m                            Systemic Diam: 2.40 cm Jenkins Rouge MD Electronically signed by Jenkins Rouge MD Signature Date/Time: 02/21/2022/11:21:33 AM    Final    VAS [name] LOWER EXTREMITY VENOUS (DVT)  Result Date: 02/22/2022  Lower Venous DVT Study Patient Name:  Jeffrey [name] [facility]  Date of Exam:   02/21/2022 Medical Rec #: [id number]         Accession #:    [id number] Date of Birth: Sep 24, 1966         Patient Gender: M Patient Age:   63 years Exam Location:  [facility] Procedure:      VAS [name] LOWER EXTREMITY VENOUS (DVT) Referring  Phys: MURALI RAMASWAMY  --------------------------------------------------------------------------------  Indications: Swelling.  Limitations: Body habitus and pitting edema. Comparison Study: No prior study on file Performing Technologist: Sharion Dove RVS  Examination Guidelines: A complete evaluation includes B-mode imaging, spectral Doppler, color Doppler, and power Doppler as needed of all accessible portions of each vessel. Bilateral testing is considered an integral part of a complete examination. Limited examinations for reoccurring indications may be performed as noted. The reflux portion of the exam is performed with the patient in reverse Trendelenburg.  +---------+---------------+---------+-----------+----------+-------------------+ RIGHT    CompressibilityPhasicitySpontaneityPropertiesThrombus Aging      +---------+---------------+---------+-----------+----------+-------------------+ CFV      Full           Yes      Yes                                      +---------+---------------+---------+-----------+----------+-------------------+ SFJ      Full                                                             +---------+---------------+---------+-----------+----------+-------------------+ FV Prox  Full                                                             +---------+---------------+---------+-----------+----------+-------------------+ FV Mid   Full                                                             +---------+---------------+---------+-----------+----------+-------------------+ FV DistalFull                                                             +---------+---------------+---------+-----------+----------+-------------------+ PFV                     Yes      Yes                  patent by color and                                                       Doppler             +---------+---------------+---------+-----------+----------+-------------------+  POP      Full           Yes      Yes                                      +---------+---------------+---------+-----------+----------+-------------------+  PTV      Full                                                             +---------+---------------+---------+-----------+----------+-------------------+ PERO     Full                                                             +---------+---------------+---------+-----------+----------+-------------------+   +---------+---------------+---------+-----------+----------+--------------+ LEFT     CompressibilityPhasicitySpontaneityPropertiesThrombus Aging +---------+---------------+---------+-----------+----------+--------------+ CFV      Full           Yes      Yes                                 +---------+---------------+---------+-----------+----------+--------------+ SFJ      Full                                                        +---------+---------------+---------+-----------+----------+--------------+ FV Prox  Full                                                        +---------+---------------+---------+-----------+----------+--------------+ FV Mid   Full                                                        +---------+---------------+---------+-----------+----------+--------------+ FV DistalFull                                                        +---------+---------------+---------+-----------+----------+--------------+ PFV      Full                                                        +---------+---------------+---------+-----------+----------+--------------+ POP      Full           Yes      Yes                                 +---------+---------------+---------+-----------+----------+--------------+ PTV      Full                                                        +---------+---------------+---------+-----------+----------+--------------+  PERO     Full                                                         +---------+---------------+---------+-----------+----------+--------------+     Summary: BILATERAL: - No evidence of deep vein thrombosis seen in the lower extremities, bilaterally. -No evidence of popliteal cyst, bilaterally.   *See table(s) above for measurements and observations. Electronically signed by Orlie Pollen on 02/22/2022 at 5:16:07 PM.    Final     Medications: Infusions:  sodium chloride 10 mL/hr at 02/23/22 0730   cefTRIAXone (ROCEPHIN)  IV     dextrose 5 % and 0.45% NaCl Stopped (02/21/22 1216)   feeding supplement (NEPRO CARB STEADY) 50 mL/hr at 02/23/22 0600   levETIRAcetam 500 mg (02/22/22 2126)   norepinephrine (LEVOPHED) Adult infusion Stopped (02/22/22 1347)    Scheduled Medications:  calcium acetate (Phos Binder)  1,334 mg Per Tube TID WC   Chlorhexidine Gluconate Cloth  6 each Topical Daily   feeding supplement (PROSource TF)  45 mL Per Tube BID   folic acid  1 mg Intravenous Daily   heparin  5,000 Units Subcutaneous Q8H   hydrocortisone sod succinate (SOLU-CORTEF) inj  100 mg Intravenous Q6H   insulin aspart  0-20 Units Subcutaneous Q4H   insulin aspart  2 Units Subcutaneous Q4H   lactulose  30 g Per Tube TID   mouth rinse  15 mL Mouth Rinse BID   midodrine  10 mg Per Tube TID WC   pantoprazole (PROTONIX) IV  40 mg Intravenous Q12H   thiamine injection  100 mg Intravenous Daily    have reviewed scheduled and prn medications.  Physical Exam: General: Ill-looking male lying on bed Heart:RRR, s1s2 nl Lungs: Distant breath sound, no increased work of breathing Abdomen:soft, Non-tender, non-distended Extremities: Bilateral leg pitting edema ++ Neurology: Alert awake and oriented to himself only.  Trini Soldo Reesa Chew Aadil Sur 02/23/2022,9:22 AM  LOS: 3 days

## 2022-02-23 NOTE — Progress Notes (Signed)
  Interdisciplinary Goals of Care Family Meeting   Date carried out: 02/23/2022  Location of the meeting: Phone conference  Member's involved: Physician and Family Member or next of kin  Durable Power of Attorney or acting medical decision maker: Baldo Ash, patients sister    Discussion: We discussed goals of care for [employer] .  Baldo Ash had previously talked with our team and has been designated by the patient's father as Ambulance person.  I asked if they were planning on coming up to visit him and she said that they are getting together as a family and talking about it but no immediate plans to come up to visit.  I explained to her that Gid is having sequela of chronic alcohol use disorder.  Unfortunately now his kidneys are shutting down he is not making any urine even with Lasix challenge.  Given our previously established goals of no aggressive measures, and given his other significant comorbidities, renal replacement therapy is not an option.  This means that unless his kidneys wake up and recover, this is a terminal condition.  I explained that he will probably die from kidney failure in the coming days to weeks.  I am recommending a transition to hospice care, most likely outpatient given that he does not have any severe electrolyte abnormalities or hemodynamic instability.  He could probably make it to an outpatient SNF with hospice services.  I will reach out to the transitions of care team for referral to hospice services.  I let Baldo Ash know that someone from hospice services will probably call out to her to get permission to this.  Obviously the patient is unable to make his own medical decisions.  She is in agreement with this plan is appreciative of our care involvement.  For now I will make him comfort measures, without any plans to put a feeding tube back in, and without uncomfortable lines, tubes, aggressive care.  She is also in agreement with this plan  Code  status: Full DNR  Disposition: Likely hospice services, probably outpatient depending on   Time spent for the meeting: additional CC time 35 minutes    Spero Geralds, MD  02/23/2022, 2:08 PM

## 2022-02-23 NOTE — Progress Notes (Signed)
[facility] Progress Note Patient Name: Jeffrey [name] DOB: 1966/05/27 MRN: [id number]   Date of Service  02/23/2022  HPI/Events of Note  Bedside nurse confirmed that patient is "comfort measures" status and his sister (who is next of kin of record) is on board.  eICU Interventions  Bedside swallow evaluation ordered, and if passed patient will be started on clear liquids and advanced to a mechanically soft diet.        Kerry Kass Sam Overbeck 02/23/2022, 10:45 PM

## 2022-02-23 NOTE — Progress Notes (Signed)
[facility] Progress Note Patient Name: Jeffrey [name] DOB: [birth year] MRN: [id number]   Date of Service  02/23/2022  HPI/Events of Note  Anemia - Hgb = 6.8.   eICU Interventions  Will transfuse 1 unit PRBC.     Intervention Category Major Interventions: Other:  Harriett Azar Cornelia Copa 02/23/2022, 2:03 AM

## 2022-02-23 NOTE — Progress Notes (Addendum)
NAME:  Jeffrey [name], MRN:  [id number], DOB:  07/07/1966, LOS: 3 ADMISSION DATE:  02/21/2022, CONSULTATION DATE:  03/02/2022  REFERRING MD:  Dr Ashok Cordia ER, CHIEF COMPLAINT:  Circulatory shock, bradycardia, hypothermia   History of Present Illness:  56 year old male known to have type 2 diabetes, chronic venous stasis edema history of cardiac arrest with "anoxic brain injury" erosive esophagitis, anemia of chronic disease, seizure disorder, essential hypertension, cirrhosis secondary to alcohol and obesity. He has multiple recent admissions for upper GIB with gastric ulcers and varices, sepsis from UTI vs SBP, adrenal insufficiency, clostridium difficile, and seizure. Most recently dc to SNF. See Consult note for full description of admissions.    Readmitted on 02/28/2022 via the ER after being found down.  Found to be hypothermic on arrival along with severe sinus bradycardia and hypotension.  Responded to fluids with improvement in mental status.  Requiring dopamine to maintain a heart rate of 60 and systolic blood pressure 462.  Unclear downtime in the nursing home.  But pattern seems to be consistent with prior admissions.  CCM called for admission.  Protecting airway but not on the ventilator 02/07/2022.  Cardiologist suspects medical cause of bradycardia.  Pacemaker not indicated.  ER RN - says he smells like "c diff"  Past Medical History:   has a past medical history of Acute renal insufficiency (12/08/2012), AKI (acute kidney injury) ([place]), ALCOHOL ABUSE, HX OF (11/10/2007), Anoxic encephalopathy ([place]), Anxiety, Bleeding ulcer (2014), Blood transfusion (2014), Cardiac arrest ([place]) (12/07/2012), GERD (gastroesophageal reflux disease), High cholesterol, Hypertension, OSA on CPAP (12/07/2012), Seizures ([place]), Type II diabetes mellitus ([place]), and Venous insufficiency (12/13/2011).   Significant Hospital Events:  5/17 admit found down at SNF. Hypotensive, bradycardic in ED. Started on  BiPAP/dopamine.   Interim History / Subjective:  Off bipap. More awake and alert this morning. Anemic and so 1 unit prbc ordered and transfused this morning. Off pressors. NSR and bradycardia improved.   Objective   Blood pressure 127/86, pulse 77, temperature 97.8 F (36.6 C), temperature source Oral, resp. rate 15, weight 126.4 kg, SpO2 99 %.        Intake/Output Summary (Last 24 hours) at 02/23/2022 0923 Last data filed at 02/23/2022 0730 Gross per 24 hour  Intake 2215.9 ml  Output 1015 ml  Net 1200.9 ml   Filed Weights   02/21/22 0331 02/22/22 0500 02/23/22 0500  Weight: 123.5 kg 123.9 kg 126.4 kg    Examination:  Gen: awake, follows commands. Oriented to self. Knows his name but not his birthday. Not oriented to place, time or situation. Unable to make complex medical decisions.  Resp: ctab no wheezes or crackles, diminished in bases CV: RRR no mrg VOJ:[id number], soft Ext: mild edema   Labs and imaging reviewed CBGs wnl Na 140 K 4.1 Cr 4.17 BUN 57 Hgb 6.8  No uop Chest xray with worsening pleural effusions   Resolved Hospital Problem list   Shock resolved Acute hypoxemic respiratory failure Assessment & Plan:    Shock: Likely symptomatic bradycardic secondary to hypothermia. Now resolved.  - back on midodrine home med.  - holding home inderal, norvasc, demadex.  - will taper off steroids.  - Ceftriaxone to cover SBP, UTI. Ascites not significant enough to tap.time out for 3 days - Toxigenic PCR C. Diff positive, antigen pos,  toxin negative . No symptoms. Hold tx for now.  Acute metabolic encephalopathy: known history of cardiac arrest with anoxic injury. Likely with hepatic encephalopathy - Lactulose continue - improved  in the last 24 hours with improvement of bradycardia, hypothermia and resuming lactulose -he is more awake and alert but not able to make complex medical decision.   QTC prolongation: 628 on admission in the setting of profound  bradycardia.  - avoid QT prolonging medications.  -Qtc this morning is 580  Decompensated alcoholic cirrhosis:  - with ascites and h/o of varices on propranolol - Trend LFT - continue lactulose  Acute renal failure on CKD IIIa: likely in the setting of shock. Creatinine slowly creeping up over the last few admissions.  - s/p volume resuscitation - lasix challenge. Discussed with nephrology. He did not respond yet. Will try again today - unforunately not a candidate for CRRT or HD so I think this would be the end of the line for him if he doesn't respond. \  Protein calorie malnutrition: severe, diffuse anasarca - Cortrak, TF  Mild hyperthyroid with elevated TSH. T4 1.6 TSH 16. - T4 normalized - TSH improved  - No need to follow further  DM - CBG monitoring and SSI  Best practice (daily eval):  Diet: NPO Pain/Anxiety/Delirium protocol (if indicated): none VAP protocol (if indicated): none DVT prophylaxis: Heparin GI prophylaxis: ppi Glucose control: ssi Mobility: bed rest Code Status: DNR/DNI. I will need to reach out to his family again today. If he does not respond to lasix today I would recommend transition to comfort measures and inpatient hospice services. I suspect he will die from renal failure. I do not know what the family's desired level of involvement is.   I spent 50 minutes in total visit time for this patient, with more than 50% spent counseling/coordinating care.   Lenice Llamas, MD Pulmonary and [place] 02/23/2022 9:38 AM Pager: see AMION  If no response to pager, please call critical care on call (see AMION) until 7pm After 7:00 pm call Elink

## 2022-02-24 DIAGNOSIS — Z66 Do not resuscitate: Secondary | ICD-10-CM | POA: Diagnosis not present

## 2022-02-24 DIAGNOSIS — R579 Shock, unspecified: Secondary | ICD-10-CM | POA: Diagnosis not present

## 2022-02-24 LAB — TYPE AND SCREEN
ABO/RH(D): O POS
Antibody Screen: NEGATIVE
Unit division: 0

## 2022-02-24 LAB — BPAM RBC
Blood Product Expiration Date: 202306142359
ISSUE DATE / TIME: 202305200230
Unit Type and Rh: 5100

## 2022-02-24 MED ORDER — LORAZEPAM 2 MG/ML IJ SOLN
1.0000 mg | INTRAMUSCULAR | Status: DC | PRN
  Administered 2022-02-25: 1 mg via INTRAVENOUS
  Filled 2022-02-24: qty 1

## 2022-02-24 MED ORDER — MORPHINE SULFATE (PF) 2 MG/ML IV SOLN
2.0000 mg | INTRAVENOUS | Status: DC | PRN
  Administered 2022-02-25: 2 mg via INTRAVENOUS
  Filled 2022-02-24: qty 1

## 2022-02-24 NOTE — Progress Notes (Signed)
Assisted patient with his lunch.  Patient does not swallow his food but pockets food in his cheeks and holds food in his mouth.  Patient's mouth was cleaned and suctioned to remove food.

## 2022-02-24 NOTE — Progress Notes (Signed)
NAME:  Jeffrey [name], MRN:  [id number], DOB:  1966/03/02, LOS: 4 ADMISSION DATE:  02/08/2022, CONSULTATION DATE:  02/11/2022  REFERRING MD:  Dr Ashok Cordia ER, CHIEF COMPLAINT:  Circulatory shock, bradycardia, hypothermia   History of Present Illness:  56 year old male known to have type 2 diabetes, chronic venous stasis edema history of cardiac arrest with "anoxic brain injury" erosive esophagitis, anemia of chronic disease, seizure disorder, essential hypertension, cirrhosis secondary to alcohol and obesity. He has multiple recent admissions for upper GIB with gastric ulcers and varices, sepsis from UTI vs SBP, adrenal insufficiency, clostridium difficile, and seizure. Most recently dc to SNF. See Consult note for full description of admissions.    Readmitted on 02/07/2022 via the ER after being found down.  Found to be hypothermic on arrival along with severe sinus bradycardia and hypotension.  Responded to fluids with improvement in mental status.  Requiring dopamine to maintain a heart rate of 60 and systolic blood pressure 696.  Unclear downtime in the nursing home.  But pattern seems to be consistent with prior admissions.  CCM called for admission.  Protecting airway but not on the ventilator 03/06/2022.  Cardiologist suspects medical cause of bradycardia.  Pacemaker not indicated.  ER RN - says he smells like "c diff"  Past Medical History:   has a past medical history of Acute renal insufficiency (12/08/2012), AKI (acute kidney injury) ([place]), ALCOHOL ABUSE, HX OF (11/10/2007), Anoxic encephalopathy ([place]), Anxiety, Bleeding ulcer (2014), Blood transfusion (2014), Cardiac arrest ([place]) (12/07/2012), GERD (gastroesophageal reflux disease), High cholesterol, Hypertension, OSA on CPAP (12/07/2012), Seizures ([place]), Type II diabetes mellitus ([place]), and Venous insufficiency (12/13/2011).   Significant Hospital Events:  5/17 admit found down at SNF. Hypotensive, bradycardic in ED. Started on  BiPAP/dopamine.   Interim History / Subjective:  No overnight issues. Made comfort care yesterday after being anuric to lasix challenge.  Objective   Blood pressure (!) 151/91, pulse 66, temperature 97.6 F (36.4 C), temperature source Oral, resp. rate 12, weight 126.4 kg, SpO2 98 %.       No intake or output data in the 24 hours ending 02/24/22 1015  Filed Weights   02/21/22 0331 02/22/22 0500 02/23/22 0500  Weight: 123.5 kg 123.9 kg 126.4 kg    Examination:  Resting comfortably no distress Not agitated or uncomfortably    Resolved Hospital Problem list   Shock resolved Acute hypoxemic respiratory failure Assessment & Plan:   Symptomatic Bradycardia Acute metabolic encephalopathy: known history of cardiac arrest with anoxic injury. Likely with hepatic encephalopathy QTC prolongation Decompensated alcoholic cirrhosis:  Anuric Acute renal failure on CKD IIIa:  Protein calorie malnutrition: Mild hyperthyroid DNR/DNI - on comfort measures  Patient with anuric AKI in the setting of cirrhosis. Not a candidate for dialysis or advanced therapies. Steady decline over the last several months secondary to sequelae of etoh use disorder. Discussed plans of care with family - sister charlotte who lives in [place]. Agreeable to full comfort measures. Likely will be appropriate for outpatient hospice. Continue ativan and morphine prn for comfort. Diet for comfort. TOC consult pending.   Best practice (daily eval):  Diet: NPO Pain/Anxiety/Delirium protocol (if indicated): none VAP protocol (if indicated): none DVT prophylaxis: Heparin GI prophylaxis: ppi Glucose control: ssi Mobility: bed rest Code Status: DNR/DNI. TOC team consulted for outpatient hospice evaluation. Recommend they reach out to sister charlotte for consent.  I spent 35 minutes in total visit time for this patient, with more than 50% spent counseling/coordinating care.  Will  sign out to [facility] to assume care 5/22.    Lenice Llamas, MD Pulmonary and [place] 02/24/2022 10:15 AM Pager: see AMION  If no response to pager, please call critical care on call (see AMION) until 7pm After 7:00 pm call Elink

## 2022-02-24 NOTE — Progress Notes (Signed)
Received patient from [place]. Pt in bed, call button  in reach. Pt response to voice at this time. Will continue to monitor.

## 2022-02-25 ENCOUNTER — Ambulatory Visit: Payer: Commercial Managed Care - HMO | Admitting: Internal Medicine

## 2022-02-25 DIAGNOSIS — R579 Shock, unspecified: Secondary | ICD-10-CM | POA: Diagnosis not present

## 2022-02-25 DIAGNOSIS — R001 Bradycardia, unspecified: Secondary | ICD-10-CM | POA: Diagnosis not present

## 2022-02-25 LAB — CULTURE, BLOOD (ROUTINE X 2)
Culture: NO GROWTH
Culture: NO GROWTH
Special Requests: ADEQUATE
Special Requests: ADEQUATE

## 2022-02-25 MED ORDER — ACETAMINOPHEN 650 MG RE SUPP: 650.0000 mg | Freq: Four times a day (QID) | RECTAL | Status: DC | PRN

## 2022-02-25 MED ORDER — ONDANSETRON 4 MG PO TBDP: 4.0000 mg | ORAL_TABLET | Freq: Four times a day (QID) | ORAL | Status: DC | PRN

## 2022-02-25 MED ORDER — GLYCOPYRROLATE 0.2 MG/ML IJ SOLN: 0.2000 mg | INTRAMUSCULAR | Status: DC | PRN

## 2022-02-25 MED ORDER — ACETAMINOPHEN 325 MG PO TABS
650.0000 mg | ORAL_TABLET | Freq: Four times a day (QID) | ORAL | Status: DC | PRN
Start: 2022-02-25 — End: 2022-02-25

## 2022-02-25 MED ORDER — POLYVINYL ALCOHOL 1.4 % OP SOLN: 1.0000 [drp] | Freq: Four times a day (QID) | OPHTHALMIC | Status: DC | PRN

## 2022-02-25 MED ORDER — HALOPERIDOL LACTATE 5 MG/ML IJ SOLN: 0.5000 mg | INTRAMUSCULAR | Status: DC | PRN

## 2022-02-25 MED ORDER — BIOTENE DRY MOUTH MT LIQD: 15.0000 mL | OROMUCOSAL | Status: DC | PRN

## 2022-02-25 MED ORDER — GLYCOPYRROLATE 1 MG PO TABS: 1.0000 mg | ORAL_TABLET | ORAL | Status: DC | PRN

## 2022-02-25 MED ORDER — HALOPERIDOL LACTATE 2 MG/ML PO CONC: 0.5000 mg | ORAL | Status: DC | PRN

## 2022-02-25 MED ORDER — ONDANSETRON HCL 4 MG/2ML IJ SOLN: 4.0000 mg | Freq: Four times a day (QID) | INTRAMUSCULAR | Status: DC | PRN

## 2022-02-25 MED ORDER — HALOPERIDOL 0.5 MG PO TABS: 0.5000 mg | ORAL_TABLET | ORAL | Status: DC | PRN

## 2022-03-07 NOTE — Progress Notes (Addendum)
Notifications of death:  Called sister Jeffrey [name] at [phone]. She asked about next steps and said Jeffrey [name]'s final affairs were to be taken care of by his friend, Jeffrey [name].   Called sister Jeffrey [name], [phone].   Called friend Jeffrey [name] at [phone], who said he would notify Mr. Digangi's local friends.   Called father Jeffrey [name] [phone], who relayed he would call the family and make final plans.   Called son Jeffrey [name] [phone] [phone]. No answer. Left HIPPA safe VM asking him to call the 6N nurses' station and ask for the physician team for room 12.   Called daughter Jeffrey [name] [phone]. She did not know he was in the hospital and did not know the family had made the decision to make him comfort / hospice care. She was very distraught but thanked me for the notification and hung up.   I do not have a phone number for the last daughter, Jeffrey [name]. Mr. Attwood family was unable to provide her number and it is not in the chart.   Jeffrey Essex, MD  ADDENDUM  Called the home phone for children Jeffrey [name], and Jeffrey [name] [phone]) given to me by friend Jeffrey [name]. Again, no answer and no option for VM.

## 2022-03-07 NOTE — Significant Event (Signed)
Patient died 8:10 am with Drs. Dameron and Jeani Hawking at the bedside. Absent pulse, no breath sounds.   Dr. Jeani Hawking will notify family.   Ezequiel Essex, MD

## 2022-03-07 NOTE — Progress Notes (Signed)
02/24/2022 0821  Attending Physican Contact  Attending Physician Notified Y  Attending Physician (First and Last Name) Dr. Catherine Lynn  Post Mortem Checklist  Date of Death 02/15/2022  Time of Death 0810  Pronounced By Dr. Lynn  Next of kin notified Yes  Name of next of kin notified of death Charles Terrell  Contact Person's Relationship to Patient Friend  Contact Person's Phone Number [phone]  Was the patient a No Code Blue or a Limited Code Blue? No  Did the patient die unattended? No  Patient restrained? Not applicable  [facility] (previously known as [facility])  Notification Date 02/18/2022  Notification Time 0823  [facility] Number [id number]/13/[id number]  Is patient a potential donor? Y  Donation Type Eyes;Tissue  Autopsy  Autopsy requested by N/A  Patient and Hospital Property Returned  Patient is satisfied that all belongings have been returned? Not applicable  Dermatherapy linen/gowns NOT sent with patient or transporter Disposable Patient Transfer/ Apparel Kit used  Notifications  Patient Placement notified that Post Mortem checklist is complete Yes  Patient Placement notified body transferred Transported to morgue  Medical Examiner  Is this a medical examiner's case? N  Funeral Home  Funeral home name/address/phone # Funeral Home unknown/ undecided  Planned location of pickup Other (Comment)    

## 2022-03-07 NOTE — Death Summary Note (Signed)
[facility] Death Summary  Patient name: Jeffrey [name] Medical record number: [id number] Date of birth: 01/20/1966 Age: 56 y.o. Gender: male Date of Admission: March 07, 2022  Date of Death: 2022-03-12 at 0810 Admitting Physician: Brand Males, MD  Primary Care Provider: [name], MD Consultants: CCM, Cardiology, Nephro, Spiritual Care  Indication for Hospitalization: Encephalopathy, hypothermia, bradycardia, hypotension  Discharge Diagnoses/Problem List:  T2DM HTN Hx cardiac arrest with anoxic brain injury ETOH cirrhosis Erosive esophagitis C. diff  Disposition: Funeral Home   Discharge Exam:  Gen: unresponsive to voice or tactile stimuli Card: no heart tones auscultated, no radial or carotid pulses palpable Resp: no lung sounds auscultated Ext: cool to touch  Brief Hospital Course:   2023-03-08: Admitted to ICU, cardiology consulted  Jeffrey [name] is a 56 yr old male who was admitted to the hospital after being found down in his nursing home on 2022-03-07. He was found to be hypothermic on arrival with severe sinus bradycardia and hypotension. He required dopamine to maintain HR>60 and adequate Bps.  CXR: right pleural effusion, bilateral hazy opacities, cardiomegaly. CT head neg. RUQ [name]: fatty liver changes of cirrhosis. CCM consulted on admission, admitted to ICU. Cardiology consulted for bradycardia, determined he was not a candidate for pacemaker. Cardiology felt the bradycardia was not the cause for this patient's hypotension, hypothermia, and encephalopathy. Nephrology consulted for AKI on CKD IIIa, believes due to prolonged ischemic ATN. With UOP declined and poor functional status and other co-morbidities they felt that CRRT would not be beneficial. Pt was made DNR/DNI after [place] discussion with his sister Jeffrey [name]. Pt made comfort care on 5/20. Transferred to [facility] service on 2023-03-13 at 0700 and died at 28. Family notified.   Significant Procedures:  NG tube placement 5/19  Significant Labs and Imaging:  Recent Labs  Lab 02/21/22 0442 02/21/22 0455 02/21/22 0641 02/22/22 0611 02/23/22 0131  WBC 7.8  --   --  14.8* 8.4  HGB 9.9*   < > 10.2* 8.0* 6.8*  HCT 31.1*   < > 30.0* 24.9* 20.4*  PLT 231  --   --  196 167   < > = values in this interval not displayed.   Recent Labs  Lab Mar 07, 2022 1900 03/07/22 1955 03/07/2022 12-06-2123 02/21/22 0455 02/21/22 0641 02/21/22 0951 02/21/22 1624 02/22/22 0611 02/22/22 1751 02/23/22 0131  NA 138 140   < > 139 136 138  --  139  --  140  K 3.8 3.9   < > 3.7 3.8 3.7  --  4.2  --  4.1  CL 108 111  --   --   --  110  --  110  --  111  CO2  --  22  --   --   --  21*  --  20*  --  20*  GLUCOSE 165* 166*  --   --   --  219*  --  211*  --  174*  BUN 48* 52*  --   --   --  52*  --  56*  --  57*  CREATININE 3.60* 3.55*  --   --   --  3.59*  --  3.89*  --  4.17*  CALCIUM  --  7.1*  --   --   --  7.1*  --  7.1*  --  7.7*  MG  --   --   --   --   --  1.3* 1.9 1.9 2.0  --  PHOS  --   --   --   --   --  6.5* 6.3* 6.6* 6.7*  --   ALKPHOS  --  159*  --   --   --   --   --  139*  --  114  AST  --  93*  --   --   --   --   --  44*  --  38  ALT  --  57*  --   --   --   --   --  44  --  36  ALBUMIN  --  1.5*  --   --   --   --   --  <1.5*  --  2.0*   < > = values in this interval not displayed.   PORTABLE CHEST 1 VIEW 02/21/2022 IMPRESSION: 1. At least moderate RIGHT pleural effusion. 2. Hazy opacities bilaterally with differential considerations including edema or multifocal infection. 3. Cardiomegaly.  CT HEAD WITHOUT CONTRAST 02/15/2022 IMPRESSION: No evidence of acute intracranial abnormality. Mild cortical atrophy.  ULTRASOUND ABDOMEN LIMITED RIGHT UPPER QUADRANT 02/21/22 IMPRESSION: 1. Fatty liver with probable changes of cirrhosis. 2. No gallstone. Diffuse gallbladder wall thickening, likely related to underlying liver disease. 3. Small ascites and right pleural effusion. 4. Patent main portal  vein with hepatopetal flow.  PORTABLE CHEST 1 VIEW 02/21/22 IMPRESSION: 1. Low lung volumes bilateral pleural effusions. 2. Some improvement in vascular congestion the upper lobes.   Results/Tests Pending at Time of Discharge: None  Discharge Medications:  Allergies as of March 15, 2022       Reactions   Lipitor [atorvastatin] Other (See Comments)   Muscle Weakness   Zestril [lisinopril] Other (See Comments)   nose bleeds   Cozaar [losartan] Other (See Comments)   Patient stopped taking this because he felt like passing out after standing up   Aldactone [spironolactone] Other (See Comments)   Hyperkalemia        Medication List     STOP taking these medications    amLODipine 5 MG tablet Commonly known as: NORVASC   blood glucose meter kit and supplies   calcium carbonate 1250 (500 Ca) MG tablet Commonly known as: OS-CAL - dosed in mg of elemental calcium   Ensure Max Protein Liqd   FeroSul 325 (65 FE) MG tablet Generic drug: ferrous sulfate   folic acid 1 MG tablet Commonly known as: Technical sales engineer 2 Sensor Misc   hydrocortisone 10 MG tablet Commonly known as: CORTEF   hydrocortisone 5 MG tablet Commonly known as: CORTEF   insulin aspart 100 UNIT/ML injection Commonly known as: novoLOG   insulin lispro 100 UNIT/ML KwikPen Commonly known as: HUMALOG   levETIRAcetam 500 MG tablet Commonly known as: KEPPRA   midodrine 5 MG tablet Commonly known as: PROAMATINE   mirtazapine 30 MG tablet Commonly known as: REMERON   multivitamin with minerals Tabs tablet   omeprazole 20 MG capsule Commonly known as: PRILOSEC   oyster calcium 500 MG Tabs tablet   pantoprazole 40 MG tablet Commonly known as: Protonix   propranolol 40 MG tablet Commonly known as: INDERAL   rosuvastatin 5 MG tablet Commonly known as: CRESTOR   tamsulosin 0.4 MG Caps capsule Commonly known as: FLOMAX   thiamine 100 MG tablet Commonly known as: Vitamin B-1    torsemide 20 MG tablet Commonly known as: DEMADEX   Trulicity 1.96 QI/2.9NL Sopn Generic drug: Dulaglutide   Vitamin D3 25 MCG tablet Commonly known  asDennie Fetters, MD Mar 21, 2022, 9:03 AM PGY-2, [place]

## 2022-03-07 NOTE — Progress Notes (Signed)
At bedside with Dr. Owens Shark. Patient exhibiting signs of active dying and is expected to pass within a couple hours. No family present. Called the chaplain to bedside.   Called sister Cammy Copa at [phone]. She cannot come to bedside as she is in [place], [place].   Next notified friend Evorn Gong at [phone]. He expressed that he tried to call up to the hospital to get Mr. Skillen's update a day or so ago, but that he was unable to do so. He was able to provide me with a home phone number for Mr. Dyches's children Marcello Moores, [place], and [place]. Mr. Enid Derry says he has tried calling to update the children before but he is unable to reach them on the phone.   Called children Wilhelmina Mcardle, and [place] at [phone]. Phone line rang unanswered for several minutes, no option for voice mail. Eventually the line dropped and turned to a busy signal.   Attempted to contact other sister Devinn Hurwitz. Phone number initially incorrect, rang through to Jefferson. Karol's number is [phone]. Marcello Moores [phone], [place] [phone].  [place], [phone], notified. She cannot come to bedside as she is over 12 hours away.

## 2022-03-07 NOTE — Progress Notes (Signed)
Chaplain called to pt's bedside by physician to minister to pt who was actively dying.  Chaplain and DO gathered at bedside.  Chaplain prayed prayers of gratitude for the life of Jeffrey [name] and prayers of commendation to a loving God who accepts [name] all just as we are.  Chaplain informed by pt's physician they passed away 10 mins later.  [place]

## 2022-03-07 DEATH — deceased

## 2022-05-27 IMAGING — MR MR HEAD W/O CM
14 of 15 series · 45 of 48 positions shown · non-contrast
Comparison: None.

CLINICAL DATA: Acute neurologic deficit.  Slurred speech.

EXAM:
MRI HEAD WITHOUT CONTRAST
TECHNIQUE: Multiplanar, multiecho pulse sequences of the brain and surrounding
structures were obtained without intravenous contrast.

[Series 5: DWI · axial · 3.0mm · 0.96mm/px · z∈[-91,+79]mm · 6 of 116 slices shown (1 of 4)]
[im 1/116]
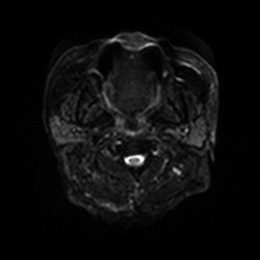
[im 24/116]
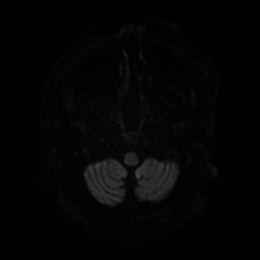
[im 47/116]
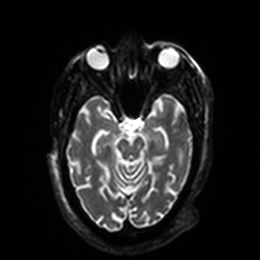
[im 70/116]
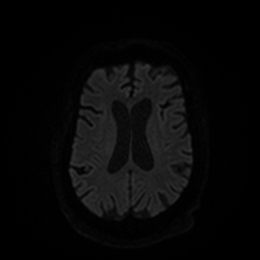
[im 93/116]
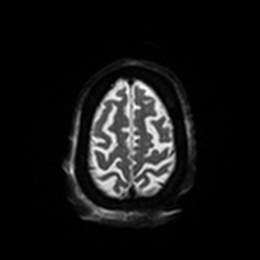
[im 116/116]
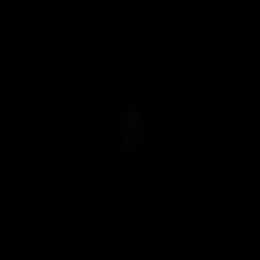

[Series 6: DWI · axial · 3.0mm · 0.96mm/px · z∈[-91,+79]mm · 4 of 57 slices shown (2 of 4)]
[im 1/57]
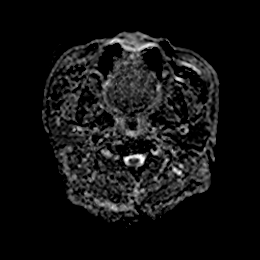
[im 19/57]
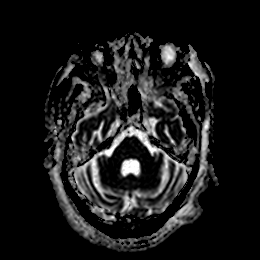
[im 38/57]
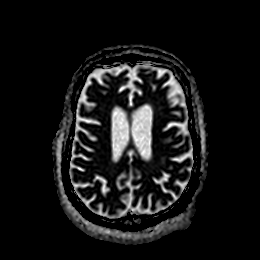
[im 57/57]
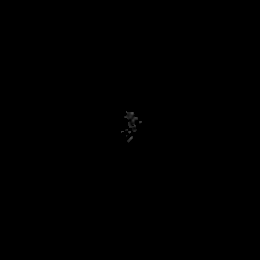

[Series 7: DWI · coronal · 4.0mm · 0.88mm/px · 5 of 88 slices shown (3 of 4)]
[im 1/88]
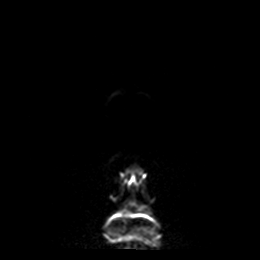
[im 22/88]
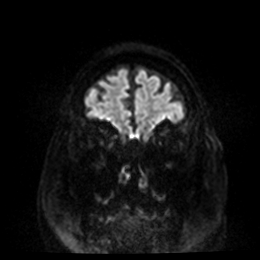
[im 44/88]
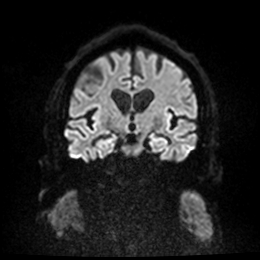
[im 66/88]
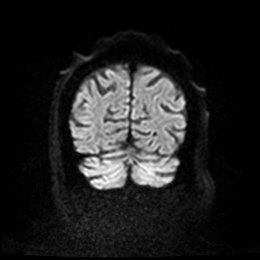
[im 88/88]
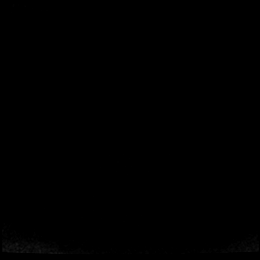

[Series 8: DWI · coronal · 4.0mm · 0.88mm/px · 3 of 43 slices shown (4 of 4)]
[im 1/43]
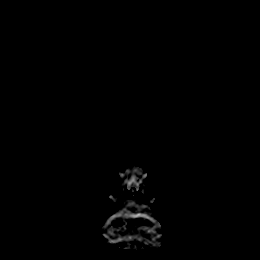
[im 22/43]
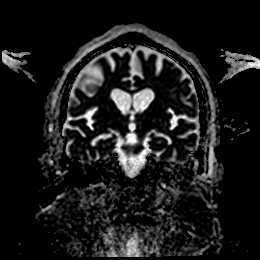
[im 43/43]
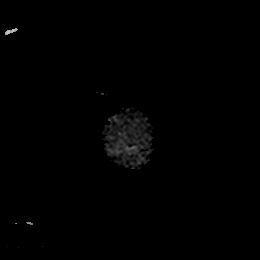

[Series 9: T1 · sagittal · 5.0mm · 0.78mm/px · 2 of 29 slices shown (1 of 2)]
[im 1/29]
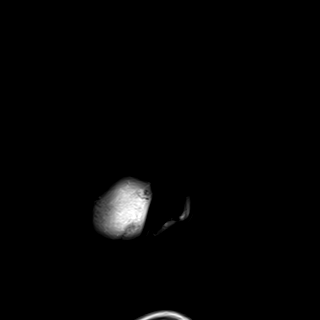
[im 29/29]
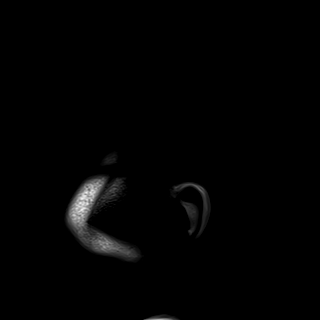

[Series 10: T2 · axial · 5.0mm · 0.78mm/px · z∈[-94,+79]mm · 2 of 30 slices shown (1 of 2)]
[im 1/30]
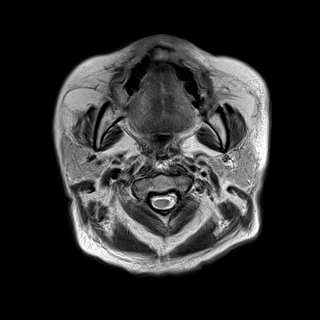
[im 30/30]
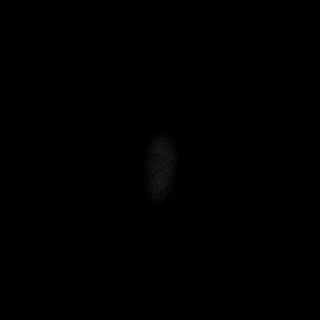

[Series 11: FLAIR · axial · 5.0mm · 0.98mm/px · z∈[-89,+77]mm · 2 of 29 slices shown]
[im 1/29]
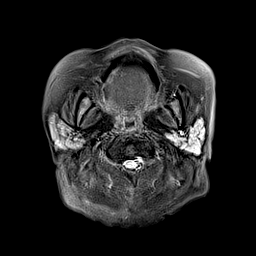
[im 29/29]
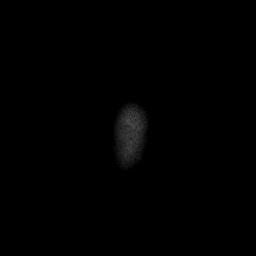

[Series 12: T1 · sagittal · 5.0mm · 0.94mm/px · 2 of 27 slices shown (2 of 2)]
[im 1/27]
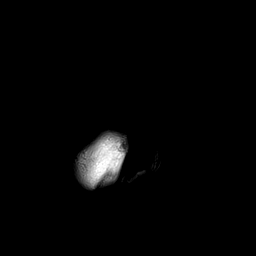
[im 27/27]
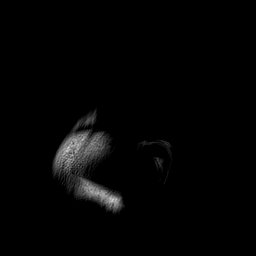

[Series 13: mag_images · axial · 3.0mm · 0.98mm/px · z∈[-93,+83]mm · 4 of 60 slices shown]
[im 1/60]
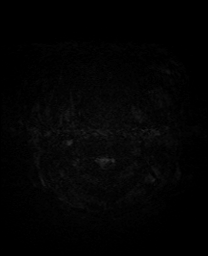
[im 20/60]
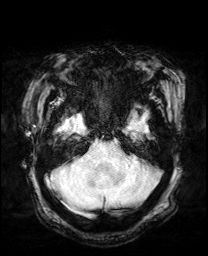
[im 40/60]
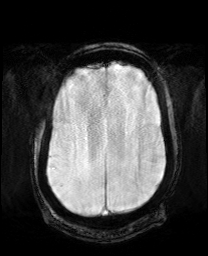
[im 60/60]
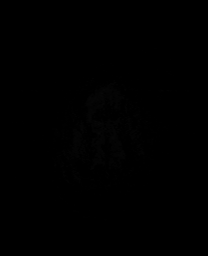

[Series 14: pha_images · axial · 3.0mm · 0.98mm/px · z∈[-87,+83]mm · 4 of 57 slices shown]
[im 1/57]
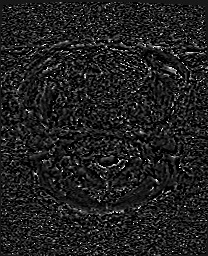
[im 19/57]
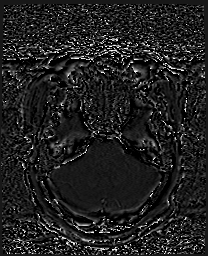
[im 38/57]
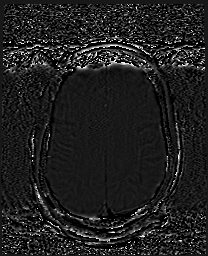
[im 57/57]
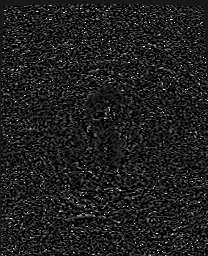

[Series 15: swi_images · axial · 3.0mm · 0.98mm/px · z∈[-93,+83]mm · 4 of 60 slices shown]
[im 1/60]
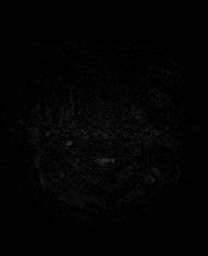
[im 20/60]
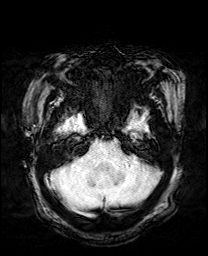
[im 40/60]
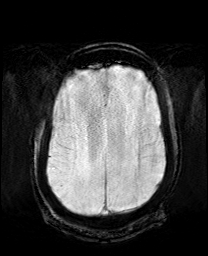
[im 60/60]
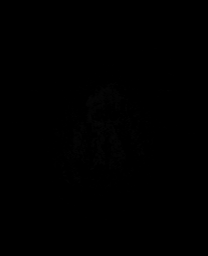

[Series 16: mip_images(sw) · axial · 24.0mm · 0.98mm/px · z∈[-82,+73]mm · 3 of 53 slices shown]
[im 1/53]
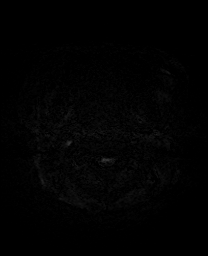
[im 27/53]
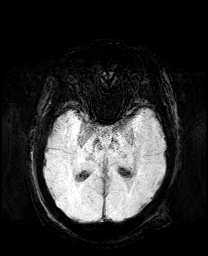
[im 53/53]
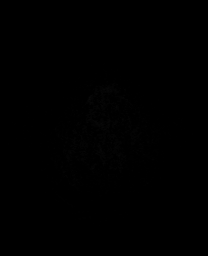

[Series 19: T2 post-contrast · coronal · 5.0mm · 0.72mm/px · 2 of 34 slices shown]
[im 1/34]
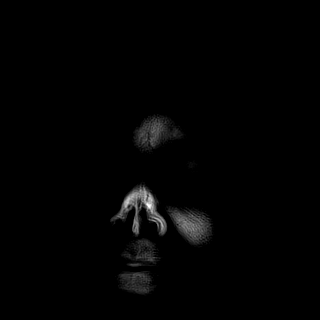
[im 34/34]
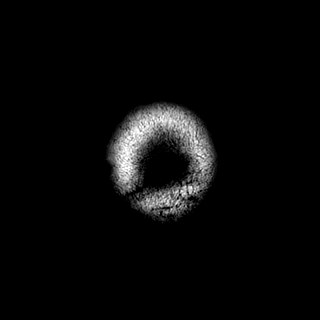

[Series 19: T2 · coronal · 5.0mm · 0.72mm/px · 2 of 34 slices shown (2 of 2)]
[im 1/34]
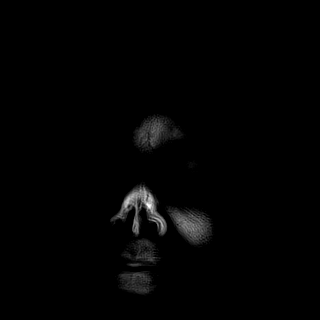
[im 34/34]
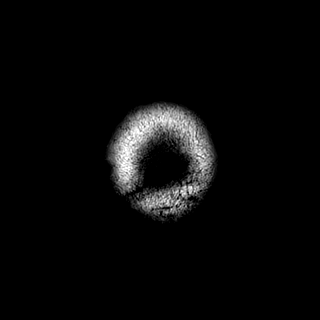

[45 of 48 positions shown; findings below may reference images not displayed]

FINDINGS: Brain: No acute infarct, mass effect or extra-axial collection. Two
foci of chronic microhemorrhage in the posterior right temporal
lobe. Normal white matter signal. Advanced atrophy for age. The
midline structures are normal.

Vascular: Major flow voids are preserved.

Skull and upper cervical spine: Normal calvarium and skull base.
Visualized upper cervical spine and soft tissues are normal.

Sinuses/Orbits:No paranasal sinus fluid levels or advanced mucosal
thickening. Mastoid effusions. Normal orbits.
IMPRESSION: 1. No acute intracranial abnormality.
2. Advanced atrophy for age.
